# Patient Record
Sex: Male | Born: 1965 | Race: White | Hispanic: No | Smoking: Current some day smoker
Health system: Southern US, Community
[De-identification: ages and names within clinical notes are randomized; demographics above are authoritative.]

## PROBLEM LIST (undated history)

## (undated) DIAGNOSIS — I509 Heart failure, unspecified: Secondary | ICD-10-CM

## (undated) DIAGNOSIS — I10 Essential (primary) hypertension: Secondary | ICD-10-CM

## (undated) DIAGNOSIS — G4733 Obstructive sleep apnea (adult) (pediatric): Secondary | ICD-10-CM

## (undated) DIAGNOSIS — J398 Other specified diseases of upper respiratory tract: Secondary | ICD-10-CM

## (undated) DIAGNOSIS — M5030 Other cervical disc degeneration, unspecified cervical region: Secondary | ICD-10-CM

## (undated) DIAGNOSIS — I5032 Chronic diastolic (congestive) heart failure: Secondary | ICD-10-CM

## (undated) DIAGNOSIS — I2510 Atherosclerotic heart disease of native coronary artery without angina pectoris: Secondary | ICD-10-CM

## (undated) DIAGNOSIS — G959 Disease of spinal cord, unspecified: Secondary | ICD-10-CM

## (undated) DIAGNOSIS — E785 Hyperlipidemia, unspecified: Secondary | ICD-10-CM

## (undated) DIAGNOSIS — J449 Chronic obstructive pulmonary disease, unspecified: Secondary | ICD-10-CM

## (undated) DIAGNOSIS — E119 Type 2 diabetes mellitus without complications: Secondary | ICD-10-CM

## (undated) DIAGNOSIS — M5020 Other cervical disc displacement, unspecified cervical region: Secondary | ICD-10-CM

## (undated) DIAGNOSIS — M109 Gout, unspecified: Secondary | ICD-10-CM

## (undated) DIAGNOSIS — N183 Chronic kidney disease, stage 3 unspecified: Secondary | ICD-10-CM

## (undated) DIAGNOSIS — E7800 Pure hypercholesterolemia, unspecified: Secondary | ICD-10-CM

## (undated) HISTORY — PX: CHOLECYSTECTOMY: SHX55

## (undated) HISTORY — PX: BACK SURGERY: SHX140

## (undated) HISTORY — DX: Essential (primary) hypertension: I10

## (undated) HISTORY — DX: Gout, unspecified: M10.9

## (undated) HISTORY — DX: Other cervical disc displacement, unspecified cervical region: M50.20

## (undated) HISTORY — DX: Other cervical disc degeneration, unspecified cervical region: M50.30

## (undated) HISTORY — PX: CARDIAC CATHETERIZATION: SHX172

---

## 2006-01-13 ENCOUNTER — Emergency Department: Payer: Self-pay | Admitting: Emergency Medicine

## 2006-01-14 ENCOUNTER — Ambulatory Visit: Payer: Self-pay | Admitting: Emergency Medicine

## 2006-01-16 ENCOUNTER — Ambulatory Visit: Payer: Self-pay | Admitting: General Surgery

## 2006-10-27 ENCOUNTER — Ambulatory Visit: Payer: Self-pay | Admitting: Family Medicine

## 2007-09-17 ENCOUNTER — Emergency Department: Payer: Self-pay | Admitting: Emergency Medicine

## 2011-08-04 ENCOUNTER — Emergency Department: Payer: Self-pay | Admitting: Emergency Medicine

## 2014-04-28 ENCOUNTER — Emergency Department: Payer: Self-pay | Admitting: Internal Medicine

## 2014-04-28 LAB — URINALYSIS, COMPLETE
BACTERIA: NONE SEEN
BILIRUBIN, UR: NEGATIVE
GLUCOSE, UR: NEGATIVE mg/dL (ref 0–75)
Ketone: NEGATIVE
LEUKOCYTE ESTERASE: NEGATIVE
NITRITE: NEGATIVE
Ph: 5 (ref 4.5–8.0)
Protein: 100
SPECIFIC GRAVITY: 1.03 (ref 1.003–1.030)

## 2014-05-19 ENCOUNTER — Emergency Department: Payer: Self-pay | Admitting: Emergency Medicine

## 2014-05-19 LAB — URIC ACID: Uric Acid: 8.2 mg/dL — ABNORMAL HIGH (ref 3.5–7.2)

## 2015-03-17 ENCOUNTER — Emergency Department
Admission: EM | Admit: 2015-03-17 | Discharge: 2015-03-17 | Disposition: A | Discharge status: Home / Self Care | Payer: Self-pay | Attending: Emergency Medicine | Admitting: Emergency Medicine

## 2015-03-17 ENCOUNTER — Encounter: Payer: Self-pay | Admitting: *Deleted

## 2015-03-17 DIAGNOSIS — L259 Unspecified contact dermatitis, unspecified cause: Secondary | ICD-10-CM | POA: Insufficient documentation

## 2015-03-17 DIAGNOSIS — Z720 Tobacco use: Secondary | ICD-10-CM | POA: Insufficient documentation

## 2015-03-17 MED ORDER — HYDROCORTISONE 1 % EX CREA: TOPICAL_CREAM | Freq: Two times a day (BID) | CUTANEOUS | Status: DC

## 2015-03-17 MED ORDER — HYDROCORTISONE 1 % EX CREA
TOPICAL_CREAM | Freq: Two times a day (BID) | CUTANEOUS | Status: DC
  Administered 2015-03-17: 05:00:00 via TOPICAL
  Filled 2015-03-17: qty 28

## 2015-03-17 MED ORDER — DIPHENHYDRAMINE HCL 50 MG PO CAPS
50.0000 mg | ORAL_CAPSULE | Freq: Once | ORAL | Status: AC
  Administered 2015-03-17: 50 mg via ORAL

## 2015-03-17 MED ORDER — DIPHENHYDRAMINE HCL 50 MG PO CAPS
ORAL_CAPSULE | ORAL | Status: AC
  Administered 2015-03-17: 50 mg via ORAL
  Filled 2015-03-17: qty 1

## 2015-03-17 NOTE — ED Notes (Signed)
Pt presents with c/o rash on left arm since Monday night. Pt reports itching and "pins and needles shooting down my arm". Pt denies use of any OTC medications PTA. Pt denies any recent changes in laundry detergents/environmental conditions that may contribute to rash.

## 2015-03-17 NOTE — ED Notes (Signed)
Brown, MD at bedside. 

## 2015-03-17 NOTE — ED Provider Notes (Signed)
[facility] Emergency Department Provider Note  ____________________________________________  Time seen: 4:20 AM  I have reviewed the triage vital signs and the nursing notes.   HISTORY  Chief Complaint Rash      HPI Cory Rivas is a 49 y.o. male resents with rash to the left forearm that is pruritic 4 days. Patient denies any fever no nausea no vomiting     History reviewed. No pertinent past medical history.  There are no active problems to display for this patient.   Past Surgical History  Procedure Laterality Date  . Cholecystectomy      No current outpatient prescriptions on file.  Allergies Review of patient's allergies indicates no known allergies.  No family history on file.  Social History History  Substance Use Topics  . Smoking status: Current Every Day Smoker  . Smokeless tobacco: Not on file  . Alcohol Use: No    Review of Systems  Constitutional: Negative for fever. Eyes: Negative for visual changes. ENT: Negative for sore throat. Cardiovascular: Negative for chest pain. Respiratory: Negative for shortness of breath. Gastrointestinal: Negative for abdominal pain, vomiting and diarrhea. Genitourinary: Negative for dysuria. Musculoskeletal: Negative for back pain. Skin: Positive for rash. Neurological: Negative for headaches, focal weakness or numbness.   10-point ROS otherwise negative.  ____________________________________________   PHYSICAL EXAM:  VITAL SIGNS: ED Triage Vitals  Enc Vitals Group     BP 03/17/15 0158 159/97 mmHg     Pulse Rate 03/17/15 0158 76     Resp 03/17/15 0158 22     Temp 03/17/15 0158 97.3 F (36.3 C)     Temp Source 03/17/15 0158 Oral     SpO2 03/17/15 0158 98 %     Weight 03/17/15 0158 245 lb (111.131 kg)     Height 03/17/15 0158 5\' 6"  (1.676 m)     Head Cir --      Peak Flow --      Pain Score 03/17/15 0417 4     Pain Loc --      Pain Edu? --      Excl. in [place]? --       Constitutional: Alert and oriented. Well appearing and in no distress. Genitourinary: deferred Musculoskeletal: Nontender with normal range of motion in all extremities. No joint effusions.  No lower extremity tenderness nor edema. Neurologic:  Normal speech and language. No gross focal neurologic deficits are appreciated. Speech is normal.  Skin:  Very scant area of maculopapular rash noted to the left forearm and no distinct pattern with evidence of excoriation noted. Psychiatric: Mood and affect are normal. Speech and behavior are normal. Patient exhibits appropriate insight and judgment.  ____________________________________________       INITIAL IMPRESSION / ASSESSMENT AND PLAN / ED COURSE  Pertinent labs & imaging results that were available during my care of the patient were reviewed by me and considered in my medical decision making (see chart for details).  History of physical concerning for possible contact dermatitis as such patient received Benadryl for pruritus. In addition patient received hydrocortisone topical cream.  ____________________________________________   FINAL CLINICAL IMPRESSION(S) / ED DIAGNOSES  Final diagnoses:  Contact dermatitis      Gregor Hams, MD 03/17/15 [phone]

## 2015-03-17 NOTE — Discharge Instructions (Signed)

## 2015-03-17 NOTE — ED Notes (Signed)
Pt reports a rash that is going up his right forearm, "feels like sharp pins" and itching

## 2015-11-22 DIAGNOSIS — G959 Disease of spinal cord, unspecified: Secondary | ICD-10-CM | POA: Insufficient documentation

## 2015-11-22 DIAGNOSIS — M5136 Other intervertebral disc degeneration, lumbar region: Secondary | ICD-10-CM | POA: Insufficient documentation

## 2015-11-22 DIAGNOSIS — M5030 Other cervical disc degeneration, unspecified cervical region: Secondary | ICD-10-CM | POA: Insufficient documentation

## 2015-11-22 DIAGNOSIS — M4722 Other spondylosis with radiculopathy, cervical region: Secondary | ICD-10-CM | POA: Insufficient documentation

## 2015-11-22 DIAGNOSIS — M4726 Other spondylosis with radiculopathy, lumbar region: Secondary | ICD-10-CM | POA: Insufficient documentation

## 2016-07-04 ENCOUNTER — Encounter: Payer: Self-pay | Admitting: Intensive Care

## 2016-07-04 ENCOUNTER — Emergency Department
Admission: EM | Admit: 2016-07-04 | Discharge: 2016-07-04 | Disposition: A | Discharge status: Home / Self Care | Payer: Self-pay | Attending: Emergency Medicine | Admitting: Emergency Medicine

## 2016-07-04 DIAGNOSIS — E119 Type 2 diabetes mellitus without complications: Secondary | ICD-10-CM | POA: Insufficient documentation

## 2016-07-04 DIAGNOSIS — H9192 Unspecified hearing loss, left ear: Secondary | ICD-10-CM | POA: Insufficient documentation

## 2016-07-04 DIAGNOSIS — F17210 Nicotine dependence, cigarettes, uncomplicated: Secondary | ICD-10-CM | POA: Insufficient documentation

## 2016-07-04 DIAGNOSIS — H7292 Unspecified perforation of tympanic membrane, left ear: Secondary | ICD-10-CM | POA: Insufficient documentation

## 2016-07-04 HISTORY — DX: Type 2 diabetes mellitus without complications: E11.9

## 2016-07-04 HISTORY — DX: Pure hypercholesterolemia, unspecified: E78.00

## 2016-07-04 MED ORDER — CIPROFLOXACIN HCL 0.3 % OP SOLN: 2.0000 [drp] | OPHTHALMIC | 0 refills | Status: DC

## 2016-07-04 MED ORDER — NAPROXEN 500 MG PO TABS: 500.0000 mg | ORAL_TABLET | Freq: Two times a day (BID) | ORAL | 0 refills | Status: DC

## 2016-07-04 MED ORDER — AMOXICILLIN 500 MG PO TABS: 500.0000 mg | ORAL_TABLET | Freq: Three times a day (TID) | ORAL | 0 refills | Status: DC

## 2016-07-04 MED ORDER — TRAMADOL HCL 50 MG PO TABS: 50.0000 mg | ORAL_TABLET | Freq: Four times a day (QID) | ORAL | 0 refills | Status: DC | PRN

## 2016-07-04 NOTE — ED Notes (Signed)
Pt informed to return if any life threatening symptoms occur.  

## 2016-07-04 NOTE — ED Triage Notes (Addendum)
Pt presents to ER with L ear pain since last Friday. Patient states "my ear has dried blood in it and has been bleeding on and off" Pt states "my hearing in the L ear has gradually gotten worse since Friday and now I cannot hear out of my L ear". No drainage noted at this time. Pt tried to see PCP and they had no openings and told pt to come to ER

## 2016-07-04 NOTE — ED Provider Notes (Signed)
[facility] Emergency Department Provider Note ____________________________________________  Time seen: Approximately 10:45 AM  I have reviewed the triage vital signs and the nursing notes.   HISTORY  Chief Complaint Otalgia    HPI Cory Rivas is a 50 y.o. male presents to the emergency department with left ear pain since last Friday. He states that he has noticed some dried blood in the ear canal on and off and he has decreased hearing in the left ear as well. Pain is gone worse since Friday. No drainage present at this time.No relief with warm compresses.  Past Medical History:  Diagnosis Date  . Diabetes mellitus without complication ([place])   . High cholesterol     There are no active problems to display for this patient.   Past Surgical History:  Procedure Laterality Date  . CHOLECYSTECTOMY      Prior to Admission medications   Medication Sig Start Date End Date Taking? Authorizing Provider  amoxicillin (AMOXIL) 500 MG tablet Take 1 tablet (500 mg total) by mouth 3 (three) times daily. 07/04/16   Victorino Dike, FNP  ciprofloxacin (CILOXAN) 0.3 % ophthalmic solution Place 2 drops into the right ear every 4 (four) hours while awake. 07/04/16   Victorino Dike, FNP  hydrocortisone cream 1 % Apply topically 2 (two) times daily. 03/17/15   Gregor Hams, MD  naproxen (NAPROSYN) 500 MG tablet Take 1 tablet (500 mg total) by mouth 2 (two) times daily with a meal. 07/04/16   Lamia Mariner B [place] Festa, FNP  traMADol (ULTRAM) 50 MG tablet Take 1 tablet (50 mg total) by mouth every 6 (six) hours as needed. 07/04/16   Victorino Dike, FNP    Allergies Review of patient's allergies indicates no known allergies.  History reviewed. No pertinent family history.  Social History Social History  Substance Use Topics  . Smoking status: Current Every Day Smoker    Types: Cigarettes  . Smokeless tobacco: Never Used  . Alcohol use No    Review of  Systems Constitutional: Negative for fever/chills Eyes: No visual changes. ENT: Positive for left earache. Positive for hearing loss to the left ear Gastrointestinal: No nausea, no vomiting. Musculoskeletal: Negative for pain. Skin: Negative for rash. Neurological: Negative for headaches, focal weakness or numbness. ____________________________________________   PHYSICAL EXAM:  VITAL SIGNS: ED Triage Vitals  Enc Vitals Group     BP 07/04/16 0926 135/82     Pulse Rate 07/04/16 0926 69     Resp 07/04/16 0926 18     Temp 07/04/16 0926 98.1 F (36.7 C)     Temp Source 07/04/16 0926 Oral     SpO2 07/04/16 0926 95 %     Weight 07/04/16 0927 250 lb (113.4 kg)     Height 07/04/16 0927 5\' 7"  (1.702 m)     Head Circumference --      Peak Flow --      Pain Score 07/04/16 0928 8     Pain Loc --      Pain Edu? --      Excl. in [place]? --     Constitutional: Alert and oriented. Well appearing and in no acute distress. Eyes: Conjunctivae are normal. PERRL. EOMI. Ears: Positive for pain with movement of left auricle:; External canal patent;  Right TM erythematous and retracted; Left TM perforation present, no drainage noted   Head: Atraumatic. Nose: No congestion/rhinnorhea. Mouth/Throat: Mucous membranes are moist.  Oropharynx non-erythematous. Hematological/Lymphatic/Immunilogical: No cervical lymphadenopathy. Cardiovascular: Normal capillary  refill. Respiratory: Normal respiratory effort.  No retractions.  Neurologic:  Normal speech and language. No gross focal neurologic deficits are appreciated. Speech is normal. No gait instability. Skin:  Skin is warm, dry and intact. No rash noted. ____________________________________________   LABS (all labs ordered are listed, but only abnormal results are displayed)  Labs Reviewed - No data to display ____________________________________________   RADIOLOGY  Not  indicated ____________________________________________   PROCEDURES  Procedure(s) performed: None  ____________________________________________   INITIAL IMPRESSION / ASSESSMENT AND PLAN / ED COURSE  Pertinent labs & imaging results that were available during my care of the patient were reviewed by me and considered in my medical decision making (see chart for details).  Clinical Course    Prescriptions for Ciprofloxacin drops and amoxicillin will be given today as well as tramadol and Naprosyn for pain.  The patient was advised to follow up with ENT. He was advised to return to the emergency department for symptoms that change or worsen if unable to schedule an appointment.  ____________________________________________   FINAL CLINICAL IMPRESSION(S) / ED DIAGNOSES  Final diagnoses:  Tympanic membrane rupture, left  Acute hearing loss, left    Note:  This document was prepared using Dragon voice recognition software and may include unintentional dictation errors.     Victorino Dike, FNP 07/04/16 [place], MD 07/21/16 [phone]

## 2016-07-04 NOTE — ED Notes (Signed)
States left ear pain sine last Friday, states he noticed dried blood in his ear, pt awake and alert in no distress

## 2016-09-20 ENCOUNTER — Encounter: Payer: Self-pay | Admitting: *Deleted

## 2016-09-20 ENCOUNTER — Emergency Department
Admission: EM | Admit: 2016-09-20 | Discharge: 2016-09-20 | Disposition: A | Discharge status: Home / Self Care | Payer: Self-pay | Attending: Emergency Medicine | Admitting: Emergency Medicine

## 2016-09-20 DIAGNOSIS — Z79899 Other long term (current) drug therapy: Secondary | ICD-10-CM | POA: Insufficient documentation

## 2016-09-20 DIAGNOSIS — M109 Gout, unspecified: Secondary | ICD-10-CM

## 2016-09-20 DIAGNOSIS — F17210 Nicotine dependence, cigarettes, uncomplicated: Secondary | ICD-10-CM | POA: Insufficient documentation

## 2016-09-20 DIAGNOSIS — Z791 Long term (current) use of non-steroidal anti-inflammatories (NSAID): Secondary | ICD-10-CM | POA: Insufficient documentation

## 2016-09-20 DIAGNOSIS — M10071 Idiopathic gout, right ankle and foot: Secondary | ICD-10-CM | POA: Insufficient documentation

## 2016-09-20 DIAGNOSIS — E119 Type 2 diabetes mellitus without complications: Secondary | ICD-10-CM | POA: Insufficient documentation

## 2016-09-20 MED ORDER — INDOMETHACIN 50 MG PO CAPS: 50.0000 mg | ORAL_CAPSULE | Freq: Three times a day (TID) | ORAL | 0 refills | Status: DC | PRN

## 2016-09-20 MED ORDER — INDOMETHACIN 50 MG PO CAPS
50.0000 mg | ORAL_CAPSULE | Freq: Once | ORAL | Status: AC
  Administered 2016-09-20: 50 mg via ORAL
  Filled 2016-09-20: qty 1

## 2016-09-20 MED ORDER — COLCHICINE 0.6 MG PO TABS: 0.6000 mg | ORAL_TABLET | Freq: Every day | ORAL | 0 refills | Status: DC

## 2016-09-20 MED ORDER — COLCHICINE 0.6 MG PO TABS
1.2000 mg | ORAL_TABLET | Freq: Once | ORAL | Status: AC
  Administered 2016-09-20: 1.2 mg via ORAL
  Filled 2016-09-20: qty 2

## 2016-09-20 NOTE — ED Notes (Signed)
Pt has right foot pain.  Pt states gout meds are not working and pt has increased pain in right foot.  No known injury.  Pt alert

## 2016-09-20 NOTE — ED Triage Notes (Signed)
Pt has pain in right foot.  Pt states gout meds not working.  Pt has increased pain.  No known injury

## 2016-09-20 NOTE — ED Provider Notes (Signed)
[facility] Emergency Department Provider Note  ____________________________________________  Time seen: Approximately 9:06 PM  I have reviewed the triage vital signs and the nursing notes.   HISTORY  Chief Complaint Foot Pain    HPI Cory Rivas is a 50 y.o. male , NAD, presents to emergency with approximately 1 week history of right foot pain and swelling. Patient states he has a history of gout in which she was placed on allopurinol approximately one year ago. Seasonal flares of gout in his feet and ankles which typically occurs in the winter time. Has had redness, warmth, swelling and pain about the dorsal portion of the right foot over the last week. States he has a family member in the hospital as been walking more than usual. Also notes he was newly diagnosed with diabetes over the last year and takes medication on regular basis. States blood sugar was less than 150 when he checked this morning. Denies any open wounds, ulcers or lesions about the foot. Denies any injuries, traumas or falls. Has no numbness, weakness, tingling. Has had no fevers, chills or body aches. Denies any chest pain or shortness of breath. States this episode is similar to pleurisy sat in the past but he is out of his colchicine.   Past Medical History:  Diagnosis Date  . Diabetes mellitus without complication ([place])   . High cholesterol     There are no active problems to display for this patient.   Past Surgical History:  Procedure Laterality Date  . CHOLECYSTECTOMY      Prior to Admission medications   Medication Sig Start Date End Date Taking? Authorizing Provider  amoxicillin (AMOXIL) 500 MG tablet Take 1 tablet (500 mg total) by mouth 3 (three) times daily. 07/04/16   Victorino Dike, FNP  ciprofloxacin (CILOXAN) 0.3 % ophthalmic solution Place 2 drops into the right ear every 4 (four) hours while awake. 07/04/16   Victorino Dike, FNP  colchicine 0.6 MG tablet Take 1  tablet (0.6 mg total) by mouth daily. Take 2 tablets at onset of pain, then may take 1 more 1 hour later if pain continues. 09/20/16   Jami L Hagler, PA-C  hydrocortisone cream 1 % Apply topically 2 (two) times daily. 03/17/15   Gregor Hams, MD  indomethacin (INDOCIN) 50 MG capsule Take 1 capsule (50 mg total) by mouth 3 (three) times daily with meals as needed. 09/20/16   Jami L Hagler, PA-C  naproxen (NAPROSYN) 500 MG tablet Take 1 tablet (500 mg total) by mouth 2 (two) times daily with a meal. 07/04/16   Cari B Triplett, FNP  traMADol (ULTRAM) 50 MG tablet Take 1 tablet (50 mg total) by mouth every 6 (six) hours as needed. 07/04/16   Victorino Dike, FNP    Allergies Patient has no known allergies.  No family history on file.  Social History Social History  Substance Use Topics  . Smoking status: Current Every Day Smoker    Types: Cigarettes  . Smokeless tobacco: Never Used  . Alcohol use No     Review of Systems  Constitutional: No fever/chills Cardiovascular: No chest pain. Respiratory: No shortness of breath.  Gastrointestinal: No abdominal pain.  No nausea, vomiting. Musculoskeletal: Positive right foot pain.  Skin: Positive redness, swelling about the right foot. Negative for rash, open wounds, ulcers. Neurological: Negative for numbness, weakness, tingling. 10-point ROS otherwise negative.  ____________________________________________   PHYSICAL EXAM:  VITAL SIGNS: ED Triage Vitals  Enc Vitals  Group     BP 09/20/16 2056 (!) 164/88     Pulse Rate 09/20/16 2056 80     Resp 09/20/16 2056 18     Temp 09/20/16 2056 98.1 F (36.7 C)     Temp Source 09/20/16 2056 Oral     SpO2 09/20/16 2056 96 %     Weight 09/20/16 2057 250 lb (113.4 kg)     Height 09/20/16 2057 5\' 6"  (1.676 m)     Head Circumference --      Peak Flow --      Pain Score --      Pain Loc --      Pain Edu? --      Excl. in [place]? --      Constitutional: Alert and oriented. Well appearing and in  no acute distress. Eyes: Conjunctivae are normal.  Head: Atraumatic. Cardiovascular: Good peripheral circulation With 2+ pulses noted in the right lower extremity. Capillary refill is brisk in all digits of the right foot Respiratory: Normal respiratory effort without tachypnea or retractions. Musculoskeletal: Tenderness to palpation diffusely about the dorsal right foot without bony abnormality or crepitus.  No lower extremity tenderness nor edema.  No joint effusions. Neurologic:  Normal speech and language. No gross focal neurologic deficits are appreciated. Sensation to light touch grossly intact about the right lower extremity. Skin: Mild warmth is noted to the skin about the dorsal right foot without evidence of cellulitis or abscess. No open wounds, ulcers or other lesions. No active oozing or weeping. Skin is warm, dry and intact. No rash, bruising noted. Psychiatric: Mood and affect are normal. Speech and behavior are normal. Patient exhibits appropriate insight and judgement.   ____________________________________________   LABS  None ____________________________________________  EKG  None ____________________________________________  RADIOLOGY  None ____________________________________________    PROCEDURES  Procedure(s) performed: None   Procedures   Medications  colchicine tablet 1.2 mg (1.2 mg Oral Given 09/20/16 2153)  indomethacin (INDOCIN) capsule 50 mg (50 mg Oral Given 09/20/16 2153)     ____________________________________________   INITIAL IMPRESSION / ASSESSMENT AND PLAN / ED COURSE  Pertinent labs & imaging results that were available during my care of the patient were reviewed by me and considered in my medical decision making (see chart for details).  Clinical Course     Patient's diagnosis is consistent with Acute gout of the right foot. Patient was given colchicine and indomethacin while in the emergency department and tolerated well  without side effects. Patient will be discharged home with prescriptions for colchicine and indomethacin to take as directed. Patient is to follow up with Dr. Elvina Mattes and podiatry for diabetic foot care. Patient is also to follow up with his primary care provider if gout symptoms persist past this treatment course. Patient is given ED precautions to return to the ED for any worsening or new symptoms.    ____________________________________________  FINAL CLINICAL IMPRESSION(S) / ED DIAGNOSES  Final diagnoses:  Acute gout of right foot, unspecified cause      NEW MEDICATIONS STARTED DURING THIS VISIT:  Discharge Medication List as of 09/20/2016  9:23 PM    START taking these medications   Details  colchicine 0.6 MG tablet Take 1 tablet (0.6 mg total) by mouth daily. Take 2 tablets at onset of pain, then may take 1 more 1 hour later if pain continues., Starting Thu 09/20/2016, Print    indomethacin (INDOCIN) 50 MG capsule Take 1 capsule (50 mg total) by mouth 3 (three) times  daily with meals as needed., Starting Thu 09/20/2016, [place], PA-C 09/20/16 2212    Rudene Re, MD 09/23/16 [phone]

## 2017-12-18 LAB — TSH: TSH: 1.86 (ref 0.41–5.90)

## 2017-12-18 LAB — BASIC METABOLIC PANEL
BUN: 13 (ref 4–21)
Creatinine: 1 (ref 0.6–1.3)
Glucose: 89
Potassium: 4.5 (ref 3.4–5.3)
Sodium: 142 (ref 137–147)

## 2017-12-18 LAB — HEMOGLOBIN A1C: Hemoglobin A1C: 5.8

## 2018-01-22 DIAGNOSIS — G8929 Other chronic pain: Secondary | ICD-10-CM | POA: Diagnosis not present

## 2018-01-22 DIAGNOSIS — M5442 Lumbago with sciatica, left side: Secondary | ICD-10-CM | POA: Diagnosis not present

## 2018-01-22 DIAGNOSIS — M542 Cervicalgia: Secondary | ICD-10-CM | POA: Diagnosis not present

## 2018-01-22 DIAGNOSIS — M5412 Radiculopathy, cervical region: Secondary | ICD-10-CM | POA: Diagnosis not present

## 2018-06-02 DIAGNOSIS — G959 Disease of spinal cord, unspecified: Secondary | ICD-10-CM | POA: Diagnosis not present

## 2018-08-03 ENCOUNTER — Emergency Department
Admission: EM | Admit: 2018-08-03 | Discharge: 2018-08-03 | Disposition: A | Discharge status: Home / Self Care | Payer: Medicaid Other | Attending: Emergency Medicine | Admitting: Emergency Medicine

## 2018-08-03 ENCOUNTER — Other Ambulatory Visit: Payer: Self-pay

## 2018-08-03 DIAGNOSIS — Z79899 Other long term (current) drug therapy: Secondary | ICD-10-CM | POA: Diagnosis not present

## 2018-08-03 DIAGNOSIS — R2242 Localized swelling, mass and lump, left lower limb: Secondary | ICD-10-CM | POA: Diagnosis present

## 2018-08-03 DIAGNOSIS — F17210 Nicotine dependence, cigarettes, uncomplicated: Secondary | ICD-10-CM | POA: Diagnosis not present

## 2018-08-03 DIAGNOSIS — M109 Gout, unspecified: Secondary | ICD-10-CM | POA: Insufficient documentation

## 2018-08-03 DIAGNOSIS — E119 Type 2 diabetes mellitus without complications: Secondary | ICD-10-CM | POA: Diagnosis not present

## 2018-08-03 DIAGNOSIS — R21 Rash and other nonspecific skin eruption: Secondary | ICD-10-CM | POA: Insufficient documentation

## 2018-08-03 LAB — GLUCOSE, CAPILLARY: Glucose-Capillary: 112 mg/dL — ABNORMAL HIGH (ref 70–99)

## 2018-08-03 MED ORDER — PREDNISONE 10 MG PO TABS: ORAL_TABLET | ORAL | 0 refills | Status: DC

## 2018-08-03 MED ORDER — KETOROLAC TROMETHAMINE 30 MG/ML IJ SOLN
30.0000 mg | Freq: Once | INTRAMUSCULAR | Status: AC
  Administered 2018-08-03: 30 mg via INTRAMUSCULAR
  Filled 2018-08-03: qty 1

## 2018-08-03 MED ORDER — METHYLPREDNISOLONE SODIUM SUCC 125 MG IJ SOLR
125.0000 mg | Freq: Once | INTRAMUSCULAR | Status: AC
  Administered 2018-08-03: 125 mg via INTRAMUSCULAR
  Filled 2018-08-03: qty 2

## 2018-08-03 MED ORDER — INDOMETHACIN 50 MG PO CAPS: 50.0000 mg | ORAL_CAPSULE | Freq: Three times a day (TID) | ORAL | 0 refills | Status: DC

## 2018-08-03 NOTE — ED Notes (Signed)
Pain and  Swelling l  Foot  Especially  Big toe    Pain began 3 days ago    History  Of gout   Denies any injury

## 2018-08-03 NOTE — ED Provider Notes (Signed)
[facility] Emergency Department Provider Note  ____________________________________________  Time seen: Approximately 8:58 PM  I have reviewed the triage vital signs and the nursing notes.   HISTORY  Chief Complaint Gout    HPI Cory Rivas is a 52 y.o. male to emergency department for evaluation of left ankle swelling and pain for 3 days.  Patient has a history of gout and states that this feels the exact same.  He states that gout started in his second toe and is now in his ankle.  He gets gout every fall at this same time of year.  No injury.  Patient also states that he noticed a rash 3 days ago to the center of his chest.  He recently used a new laundry detergent and a "cheap soap".  Rash is mildly itchy.  No new medications.  Primary care was not able to see him until next week. No wounds. No trauma. No IV drug use.   Past Medical History:  Diagnosis Date  . Diabetes mellitus without complication ([place])   . High cholesterol     There are no active problems to display for this patient.   Past Surgical History:  Procedure Laterality Date  . CHOLECYSTECTOMY      Prior to Admission medications   Medication Sig Start Date End Date Taking? Authorizing Provider  amoxicillin (AMOXIL) 500 MG tablet Take 1 tablet (500 mg total) by mouth 3 (three) times daily. 07/04/16   Triplett, Cari B, FNP  ciprofloxacin (CILOXAN) 0.3 % ophthalmic solution Place 2 drops into the right ear every 4 (four) hours while awake. 07/04/16   Triplett, Johnette Abraham B, FNP  colchicine 0.6 MG tablet Take 1 tablet (0.6 mg total) by mouth daily. Take 2 tablets at onset of pain, then may take 1 more 1 hour later if pain continues. 09/20/16   Hagler, Jami L, PA-C  hydrocortisone cream 1 % Apply topically 2 (two) times daily. 03/17/15   Gregor Hams, MD  indomethacin (INDOCIN) 50 MG capsule Take 1 capsule (50 mg total) by mouth 3 (three) times daily. 08/03/18   Laban Emperor, PA-C  naproxen  (NAPROSYN) 500 MG tablet Take 1 tablet (500 mg total) by mouth 2 (two) times daily with a meal. 07/04/16   Triplett, Cari B, FNP  predniSONE (DELTASONE) 10 MG tablet Take 6 tablets on day 1, take 5 tablets on day 2, take 4 tablets on day 3, take 3 tablets on day 4, take 2 tablets on day 5, take 1 tablet on day 6 08/03/18   Laban Emperor, PA-C  traMADol (ULTRAM) 50 MG tablet Take 1 tablet (50 mg total) by mouth every 6 (six) hours as needed. 07/04/16   Victorino Dike, FNP    Allergies Patient has no known allergies.  No family history on file.  Social History Social History   Tobacco Use  . Smoking status: Current Every Day Smoker    Types: Cigarettes  . Smokeless tobacco: Never Used  Substance Use Topics  . Alcohol use: No  . Drug use: No     Review of Systems  Cardiovascular: No chest pain. Respiratory: No SOB. Gastrointestinal: No abdominal pain.  No nausea, no vomiting.  Musculoskeletal: Positive for ankle pain.  Skin: Negative for abrasions, lacerations, ecchymosis. Positive for rash.  Neurological: Negative for headaches, numbness or tingling   ____________________________________________   PHYSICAL EXAM:  VITAL SIGNS: ED Triage Vitals  Enc Vitals Group     BP 08/03/18 1952 (!) 160/74  Pulse Rate 08/03/18 1952 88     Resp 08/03/18 1952 19     Temp 08/03/18 1952 99.5 F (37.5 C)     Temp Source 08/03/18 1952 Oral     SpO2 08/03/18 1952 97 %     Weight 08/03/18 1953 250 lb (113.4 kg)     Height 08/03/18 1953 5\' 7"  (1.702 m)     Head Circumference --      Peak Flow --      Pain Score 08/03/18 1953 10     Pain Loc --      Pain Edu? --      Excl. in [place]? --      Constitutional: Alert and oriented. Well appearing and in no acute distress. Eyes: Conjunctivae are normal. PERRL. EOMI. Head: Atraumatic. ENT:      Ears:      Nose: No congestion/rhinnorhea.      Mouth/Throat: Mucous membranes are moist.  Neck: No stridor.  Cardiovascular: Normal rate,  regular rhythm.  Good peripheral circulation. Respiratory: Normal respiratory effort without tachypnea or retractions. Lungs CTAB. Good air entry to the bases with no decreased or absent breath sounds. Gastrointestinal: Bowel sounds 4 quadrants. Soft and nontender to palpation. No guarding or rigidity. No palpable masses. No distention.  Musculoskeletal: Full range of motion to all extremities. No gross deformities appreciated.  Swelling and erythema to lateral ankle.  Full range of motion of ankle but with pain. Neurologic:  Normal speech and language. No gross focal neurologic deficits are appreciated.  Skin:  Skin is warm, dry and intact.  Few pink 1 cm plaques to center chest. Psychiatric: Mood and affect are normal. Speech and behavior are normal. Patient exhibits appropriate insight and judgement.   ____________________________________________   LABS (all labs ordered are listed, but only abnormal results are displayed)  Labs Reviewed  GLUCOSE, CAPILLARY - Abnormal; Notable for the following components:      Result Value   Glucose-Capillary 112 (*)    All other components within normal limits  CBG MONITORING, ED   ____________________________________________  EKG   ____________________________________________  RADIOLOGY   No results found.  ____________________________________________    PROCEDURES  Procedure(s) performed:    Procedures    Medications  methylPREDNISolone sodium succinate (SOLU-MEDROL) 125 mg/2 mL injection 125 mg (125 mg Intramuscular Given 08/03/18 2149)  ketorolac (TORADOL) 30 MG/ML injection 30 mg (30 mg Intramuscular Given 08/03/18 2152)     ____________________________________________   INITIAL IMPRESSION / ASSESSMENT AND PLAN / ED COURSE  Pertinent labs & imaging results that were available during my care of the patient were reviewed by me and considered in my medical decision making (see chart for details).  Review of the [place]  CSRS was performed in accordance of the [place] prior to dispensing any controlled drugs.     Patient's diagnosis is consistent with gout.  Vital signs and exam are reassuring.  IM Solu-Medrol and Toradol were given.  We discussed small likelihood of cellulitis or infection and patient declines xray, antibiotics or further work-up.  Rash appears inflammatory.  No indication of infection.  Patient will be discharged home with prescriptions for indomethacin and prednisone. Patient is to follow up with primary care as directed.  Patient is agreeable with plan.  Patient is given ED precautions to return to the ED for any worsening or new symptoms.     ____________________________________________  FINAL CLINICAL IMPRESSION(S) / ED DIAGNOSES  Final diagnoses:  Acute gout of left ankle, unspecified cause  Rash      NEW MEDICATIONS STARTED DURING THIS VISIT:  ED Discharge Orders         Ordered    indomethacin (INDOCIN) 50 MG capsule  3 times daily     08/03/18 2145    predniSONE (DELTASONE) 10 MG tablet     08/03/18 2145              This chart was dictated using voice recognition software/Dragon. Despite best efforts to proofread, errors can occur which can change the meaning. Any change was purely unintentional.    Laban Emperor, PA-C 08/03/18 2247    Earleen Newport, MD 08/08/18 1356

## 2018-08-03 NOTE — ED Triage Notes (Addendum)
Pt comes via POV from home with c/o possible gout. Pt states this started last Thursday. Pt states redness, swelling and pain to left ankle. Pt states it started in his toes and has worked its way up his foot to his ankle.  Pt also states a rash that he noticed Friday night on his chest. Pt states pain and itching. Pt denies any new foods or detergents.

## 2018-09-30 DIAGNOSIS — M488X2 Other specified spondylopathies, cervical region: Secondary | ICD-10-CM | POA: Diagnosis not present

## 2018-09-30 DIAGNOSIS — M4802 Spinal stenosis, cervical region: Secondary | ICD-10-CM | POA: Diagnosis not present

## 2019-03-18 ENCOUNTER — Ambulatory Visit: Payer: Medicaid Other | Admitting: Family Medicine

## 2019-03-18 ENCOUNTER — Telehealth: Payer: Self-pay | Admitting: Family Medicine

## 2019-03-18 ENCOUNTER — Encounter: Payer: Self-pay | Admitting: Family Medicine

## 2019-03-18 ENCOUNTER — Other Ambulatory Visit: Payer: Self-pay

## 2019-03-18 VITALS — BP 150/92 | HR 96 | Temp 98.1°F | Resp 14 | Ht 66.0 in | Wt 254.7 lb

## 2019-03-18 DIAGNOSIS — I10 Essential (primary) hypertension: Secondary | ICD-10-CM | POA: Diagnosis not present

## 2019-03-18 DIAGNOSIS — Z1211 Encounter for screening for malignant neoplasm of colon: Secondary | ICD-10-CM | POA: Diagnosis not present

## 2019-03-18 DIAGNOSIS — L309 Dermatitis, unspecified: Secondary | ICD-10-CM

## 2019-03-18 DIAGNOSIS — E1169 Type 2 diabetes mellitus with other specified complication: Secondary | ICD-10-CM | POA: Diagnosis not present

## 2019-03-18 DIAGNOSIS — M79644 Pain in right finger(s): Secondary | ICD-10-CM

## 2019-03-18 DIAGNOSIS — E785 Hyperlipidemia, unspecified: Secondary | ICD-10-CM | POA: Diagnosis not present

## 2019-03-18 DIAGNOSIS — W57XXXA Bitten or stung by nonvenomous insect and other nonvenomous arthropods, initial encounter: Secondary | ICD-10-CM

## 2019-03-18 DIAGNOSIS — L602 Onychogryphosis: Secondary | ICD-10-CM | POA: Diagnosis not present

## 2019-03-18 DIAGNOSIS — Z1159 Encounter for screening for other viral diseases: Secondary | ICD-10-CM | POA: Diagnosis not present

## 2019-03-18 DIAGNOSIS — Z114 Encounter for screening for human immunodeficiency virus [HIV]: Secondary | ICD-10-CM

## 2019-03-18 DIAGNOSIS — E119 Type 2 diabetes mellitus without complications: Secondary | ICD-10-CM | POA: Insufficient documentation

## 2019-03-18 DIAGNOSIS — Z1212 Encounter for screening for malignant neoplasm of rectum: Secondary | ICD-10-CM

## 2019-03-18 DIAGNOSIS — Z6841 Body Mass Index (BMI) 40.0 and over, adult: Secondary | ICD-10-CM

## 2019-03-18 DIAGNOSIS — M1A09X0 Idiopathic chronic gout, multiple sites, without tophus (tophi): Secondary | ICD-10-CM

## 2019-03-18 DIAGNOSIS — Z716 Tobacco abuse counseling: Secondary | ICD-10-CM | POA: Diagnosis not present

## 2019-03-18 MED ORDER — TRIAMCINOLONE ACETONIDE 0.05 % EX OINT: 1.0000 "application " | TOPICAL_OINTMENT | Freq: Two times a day (BID) | CUTANEOUS | 0 refills | Status: DC | PRN

## 2019-03-18 MED ORDER — ALLOPURINOL 100 MG PO TABS: 100.0000 mg | ORAL_TABLET | Freq: Every day | ORAL | 1 refills | Status: DC

## 2019-03-18 MED ORDER — INDOMETHACIN 50 MG PO CAPS: 50.0000 mg | ORAL_CAPSULE | Freq: Three times a day (TID) | ORAL | 1 refills | Status: DC | PRN

## 2019-03-18 MED ORDER — LISINOPRIL 10 MG PO TABS: 10.0000 mg | ORAL_TABLET | Freq: Every day | ORAL | 3 refills | Status: DC

## 2019-03-18 NOTE — Telephone Encounter (Signed)
Copied from [place] [phone]. Topic: Quick Communication - Rx Refill/Question >> Mar 18, 2019  3:39 PM Selinda Flavin B, NT wrote: **Landen with [facility] calling and states that this medication they only have in the tub and it will be $800 out of pocket for the patient. States that they have the 0.01 or 0.25% cream, but would need clarification on which the provider would like to substitute. Please advise.**  Medication: TRIAMCINOLONE ACETONIDE, TOP, 0.05 % OINT  Has the patient contacted their pharmacy? yes (Agent: If no, request that the patient contact the pharmacy for the refill.) (Agent: If yes, when and what did the pharmacy advise?)  Preferred Pharmacy (with phone number or street name): [facility] - [place] - [place]: Please be advised that RX refills may take up to 3 business days. We ask that you follow-up with your pharmacy.

## 2019-03-18 NOTE — Progress Notes (Signed)
Name: Cory Rivas   MRN: [id number]    DOB: [birth year]   Date:03/18/2019       Progress Note  Subjective  Chief Complaint  Chief Complaint  Patient presents with  . New Patient (Initial Visit)  . Medication Refill  . Hand Pain    middle right finger (has been broken before)  . Tick Removal    left leg behind knee, no rash was removed  . Rash    red on middle of chest onset years    HPI  HTN: Taking 5mg  lisinopril but has been out for over a month - states has had HTN in the past, but is mostly on this for kidney protection due to DM.  No chest pain or shortness of breath, no BLE edema.  Obesity: Highest weight was 268lbs; he is 254lbs today, but states has not been following his healthy diet lately.  Working out in the yard, not walking at this time, encouraged to restart healthy diet and exercise.   Chronic Neck Pain: Has ongoing neck pain, has a "crushed disc" in his neck - is planning to have surgical repair after COVID-19 Pandemic allows.  He is seeing Dr. Wynn [facility]er with [facility] for management. He is not doing Physical therapy any longer - this did not seem to help.   Hx Gout: Taking allopurinol daily; has indomethacin that he takes PRN which really helps.  Normally gets flares in bilateral feet and ankles, and the left knee.  He has had to go to the hospital before to get things under control.   HLD: Has not taken his pravastatin in a while because he ran out.  We will check labs today and determine best course of action.  Denies chest pain or shortness of breath, no myalgias.  RIGHT Middle Finger: Notes soreness ongoing intermittently, he has history of crush injury years ago.  He notes recent pain flare is improving, so we will not proceed with any intervention at this time.  Recent Tick Bite: Pulled tick off - tick was embedded but he is confident he removed the entire thing.  He has no rash at the site.  We discussed option for 1 time dose of Doxycyline 200mg , but he declines at  this time.   Rash: Rash to central chest: Present intermittently for 4 years - was on abdomen, now on chest.  Endorses itching, no pain or discharge from the area.  Smoking: Smoking 1.5ppd (cut back from 2ppd), has been smoking since he was about 53yo.  Will start Annual CT Scans at age 65yo. Has quit for about 8 months in the past. Not ready to quit.  Diabetes mellitus type 2 Checking sugars?  yes How often? Almost daily Range (low to high) over last two weeks:  Running average in the 110's - lowest 115-125. Does patient feel additional teaching/training would be helpful?  no Have they attended Diabetes education classes? no  Trying to limit white bread, white rice, white potatoes, sweets?  Has not been goo with diet lately.  Is wanting to get back on track Trying to limit sweetened drinks like iced tea, soft drinks, sports drinks, fruit juices?  Drinks SunDrop - advised must drink diet Checking feet every day/night?  yes Last eye exam:  Last year - reminded that he is due for this. Denies: Polyuria, polydipsia, polyphagia, vision changes, or neuropathy. Most recent A1C: No results found for: HGBA1C  We will recheck today. Last CMP Results : is due for repeat  today Urine Micro UTD? No Current Medication Management: Diabetic Medications: Metformin only - is compliant ACEI/ARB: Yes Statin: Was taking, but ran out.  Aspirin therapy: No   Patient Active Problem List   Diagnosis Date Noted  . Hyperlipidemia associated with type 2 diabetes mellitus ([place]) 03/18/2019  . Diabetes mellitus ([place]) 03/18/2019  . Idiopathic chronic gout of multiple sites without tophus 03/18/2019  . Essential hypertension 03/18/2019  . Dermatitis 03/18/2019  . Tobacco abuse counseling 03/18/2019  . Tick bite 03/18/2019  . Pain of right middle finger 03/18/2019  . Lumbar degenerative disc disease 11/22/2015  . Osteoarthritis of spine with radiculopathy, cervical region 11/22/2015  . Osteoarthritis of spine  with radiculopathy, lumbar region 11/22/2015    Past Surgical History:  Procedure Laterality Date  . CHOLECYSTECTOMY      Family History  Problem Relation Age of Onset  . Heart disease Mother   . Diabetes Mother   . Hyperlipidemia Mother   . Hypertension Mother   . Lung cancer Father   . Hypertension Father   . Stroke Father   . AAA (abdominal aortic aneurysm) Maternal Grandmother   . Heart attack Maternal Grandmother   . Diabetes Maternal Grandfather     Social History   Socioeconomic History  . Marital status: Divorced    Spouse name: Not on file  . Number of children: Not on file  . Years of education: 36  . Highest education level: 9th grade  Occupational History  . Occupation: disability  Social Needs  . Financial resource strain: Very hard  . Food insecurity:    Worry: Often true    Inability: Often true  . Transportation needs:    Medical: No    Non-medical: No  Tobacco Use  . Smoking status: Current Every Day Smoker    Packs/day: 1.50    Types: Cigarettes  . Smokeless tobacco: Never Used  Substance and Sexual Activity  . Alcohol use: No  . Drug use: No  . Sexual activity: Yes  Lifestyle  . Physical activity:    Days per week: 0 days    Minutes per session: 0 min  . Stress: To some extent  Relationships  . Social connections:    Talks on phone: Once a week    Gets together: Once a week    Attends religious service: Never    Active member of club or organization: No    Attends meetings of clubs or organizations: Never    Relationship status: Divorced  . Intimate partner violence:    Fear of current or ex partner: No    Emotionally abused: No    Physically abused: No    Forced sexual activity: No  Other Topics Concern  . Not on file  Social History Narrative  . Not on file     Current Outpatient Medications:  .  allopurinol (ZYLOPRIM) 100 MG tablet, Take 100 mg by mouth daily., Disp: , Rfl:  .  indomethacin (INDOCIN) 50 MG capsule, Take  1 capsule (50 mg total) by mouth 3 (three) times daily., Disp: 30 capsule, Rfl: 0 .  lisinopril (ZESTRIL) 5 MG tablet, Take 5 mg by mouth daily., Disp: , Rfl:  .  metFORMIN (GLUCOPHAGE) 1000 MG tablet, Take 1,000 mg by mouth daily., Disp: , Rfl:  .  naproxen (NAPROSYN) 500 MG tablet, Take 1 tablet (500 mg total) by mouth 2 (two) times daily with a meal., Disp: 30 tablet, Rfl: 0  No Known Allergies  I personally reviewed active  problem list, medication list, allergies, notes from last encounter, lab results with the patient/caregiver today.   ROS  Constitutional: Negative for fever or weight change.  Respiratory: Negative for cough and shortness of breath.   Cardiovascular: Negative for chest pain or palpitations.  Gastrointestinal: Negative for abdominal pain, no bowel changes.  Musculoskeletal: Negative for gait problem or joint swelling.  Skin: Negative for rash.  Neurological: Negative for dizziness or headache.  No other specific complaints in a complete review of systems (except as listed in HPI above).  Objective  Vitals:   03/18/19 0911  BP: (!) 150/92  Pulse: 96  Resp: 14  Temp: 98.1 F (36.7 C)  TempSrc: Oral  SpO2: 98%  Weight: 254 lb 11.2 oz (115.5 kg)  Height: 5\' 6"  (1.676 m)   Body mass index is 41.11 kg/m.  Physical Exam  Constitutional: Patient appears well-developed and well-nourished. No distress.  HENT: Head: Normocephalic and atraumatic.  Eyes: Conjunctivae and EOM are normal. No scleral icterus. Neck: Normal range of motion. Neck supple. No JVD present. No thyromegaly present.  Cardiovascular: Normal rate, regular rhythm and normal heart sounds.  No murmur heard. No BLE edema. Pulmonary/Chest: Effort normal and breath sounds normal. No respiratory distress. Musculoskeletal: Normal range of motion, no joint effusions. No gross deformities Neurological: Pt is alert and oriented to person, place, and time. No cranial nerve deficit. Coordination,  balance, strength, speech and gait are normal.  Skin: Skin is warm and dry. There is a vertical rash to the central chest that has erythematous base and white plaques that are slightly raised.  LEFT knee exhibits no rash.  Psychiatric: Patient has a normal mood and affect. behavior is normal. Judgment and thought content normal.  No results found for this or any previous visit (from the past 72 hour(s)).  Diabetic Foot Exam: Diabetic Foot Exam - Simple   Simple Foot Form Diabetic Foot exam was performed with the following findings:  Yes 03/18/2019 10:10 AM  Visual Inspection No deformities, no ulcerations, no other skin breakdown bilaterally:  Yes Sensation Testing Intact to touch and monofilament testing bilaterally:  Yes Pulse Check Posterior Tibialis and Dorsalis pulse intact bilaterally:  Yes Comments Bilateral toenails are overgrown and curled over.    PHQ2/9: Depression screen PHQ 2/9 03/18/2019  Decreased Interest 0  Down, Depressed, Hopeless 0  PHQ - 2 Score 0  Altered sleeping 0  Tired, decreased energy 0  Change in appetite 0  Feeling bad or failure about yourself  0  Trouble concentrating 0  Moving slowly or fidgety/restless 0  Suicidal thoughts 0  PHQ-9 Score 0  Difficult doing work/chores Not difficult at all   PHQ-2/9 Result is negative.    Fall Risk: Fall Risk  03/18/2019  Falls in the past year? 1  Number falls in past yr: 0  Injury with Fall? 0    Functional Status Survey: Is the patient deaf or have difficulty hearing?: No Does the patient have difficulty seeing, even when wearing glasses/contacts?: No Does the patient have difficulty concentrating, remembering, or making decisions?: No Does the patient have difficulty walking or climbing stairs?: No Does the patient have difficulty dressing or bathing?: No Does the patient have difficulty doing errands alone such as visiting a doctor's office or shopping?: No  Assessment & Plan  1. Idiopathic chronic  gout of multiple sites without tophus - allopurinol (ZYLOPRIM) 100 MG tablet; Take 1 tablet (100 mg total) by mouth daily.  Dispense: 90 tablet; Refill: 1 -  indomethacin (INDOCIN) 50 MG capsule; Take 1 capsule (50 mg total) by mouth 3 (three) times daily as needed (Gout Flare).  Dispense: 30 capsule; Refill: 1  2. Essential hypertension - Increase to 10mg , recheck in 2 weeks - lisinopril (ZESTRIL) 10 MG tablet; Take 1 tablet (10 mg total) by mouth daily.  Dispense: 90 tablet; Refill: 3  3. Dermatitis - TRIAMCINOLONE ACETONIDE, TOP, 0.05 % OINT; Apply 1 application topically 2 (two) times daily as needed (For Rash/Itching). Use for 7-10 days at a time, then 7 days off. Too strong for breast, genitals, armpits, or face  Dispense: 100 g; Refill: 0  4. Tobacco abuse counseling - Not ready to quit  5. Tick bite, initial encounter - Declines doxycycline 200mg  x1. Monitor for symptoms - reviewed with patient  6. Pain of right middle finger - Stable and improving.  7. Type 2 diabetes mellitus with other specified complication, without long-term current use of insulin ([place]) - Diet discussed.  Continue Metformin - COMPLETE METABOLIC PANEL WITH GFR - Lipid panel - Hemoglobin A1c - Microalbumin / creatinine urine ratio - Ambulatory referral to Podiatry  8. Hyperlipidemia associated with type 2 diabetes mellitus ([facility]) - Lipid panel  9. Long toenail - Ambulatory referral to Podiatry  10. Class 3 severe obesity due to excess calories with serious comorbidity and body mass index (BMI) of 40.0 to 44.9 in adult [facility]) - Discussed importance of 150 minutes of physical activity weekly, eat two servings of fish weekly, eat one serving of tree nuts ( cashews, pistachios, pecans, almonds.Marland Kitchen) every other day, eat 6 servings of fruit/vegetables daily and drink plenty of water and avoid sweet beverages.  - COMPLETE METABOLIC PANEL WITH GFR - Lipid panel - Hemoglobin A1c  11. Need for hepatitis C  screening test - Hepatitis C antibody  12. Encounter for screening for HIV - HIV Antibody (routine testing w rflx)  13. Encounter for colorectal cancer screening - Ambulatory referral to Gastroenterology

## 2019-03-19 LAB — COMPLETE METABOLIC PANEL WITH GFR
AG Ratio: 1.4 (calc) (ref 1.0–2.5)
ALT: 32 U/L (ref 9–46)
AST: 28 U/L (ref 10–35)
Albumin: 4.2 g/dL (ref 3.6–5.1)
Alkaline phosphatase (APISO): 59 U/L (ref 35–144)
BUN: 14 mg/dL (ref 7–25)
CO2: 27 mmol/L (ref 20–32)
Calcium: 9.3 mg/dL (ref 8.6–10.3)
Chloride: 104 mmol/L (ref 98–110)
Creat: 1.22 mg/dL (ref 0.70–1.33)
GFR, Est African American: 79 mL/min/{1.73_m2} (ref >=60)
GFR, Est Non African American: 68 mL/min/{1.73_m2} (ref >=60)
Globulin: 2.9 g/dL (calc) (ref 1.9–3.7)
Glucose, Bld: 85 mg/dL (ref 65–99)
Potassium: 4.5 mmol/L (ref 3.5–5.3)
Sodium: 140 mmol/L (ref 135–146)
Total Bilirubin: 0.5 mg/dL (ref 0.2–1.2)
Total Protein: 7.1 g/dL (ref 6.1–8.1)

## 2019-03-19 LAB — MICROALBUMIN / CREATININE URINE RATIO
Creatinine, Urine: 165 mg/dL (ref 20–320)
Microalb Creat Ratio: 845 mcg/mg creat — ABNORMAL HIGH (ref <30)
Microalb, Ur: 139.4 mg/dL

## 2019-03-19 LAB — HEMOGLOBIN A1C
Hgb A1c MFr Bld: 5.9 % of total Hgb — ABNORMAL HIGH (ref <5.7)
Mean Plasma Glucose: 123 (calc)
eAG (mmol/L): 6.8 (calc)

## 2019-03-19 LAB — HEPATITIS C ANTIBODY
Hepatitis C Ab: NONREACTIVE
SIGNAL TO CUT-OFF: 0.02 (ref <1.00)

## 2019-03-19 LAB — LIPID PANEL
Cholesterol: 235 mg/dL — ABNORMAL HIGH (ref <200)
HDL: 48 mg/dL (ref >=40)
LDL Cholesterol (Calc): 149 mg/dL (calc) — ABNORMAL HIGH
Non-HDL Cholesterol (Calc): 187 mg/dL (calc) — ABNORMAL HIGH (ref <130)
Total CHOL/HDL Ratio: 4.9 (calc) (ref <5.0)
Triglycerides: 235 mg/dL — ABNORMAL HIGH (ref <150)

## 2019-03-19 LAB — HIV ANTIBODY (ROUTINE TESTING W REFLEX): HIV 1&2 Ab, 4th Generation: NONREACTIVE

## 2019-03-19 MED ORDER — TRIAMCINOLONE ACETONIDE 0.1 % EX CREA: 1.0000 "application " | TOPICAL_CREAM | Freq: Two times a day (BID) | CUTANEOUS | 0 refills | Status: DC

## 2019-03-19 NOTE — Telephone Encounter (Signed)
Cream sent in replacement.

## 2019-03-20 ENCOUNTER — Other Ambulatory Visit: Payer: Self-pay | Admitting: Family Medicine

## 2019-03-20 DIAGNOSIS — R809 Proteinuria, unspecified: Secondary | ICD-10-CM

## 2019-03-20 DIAGNOSIS — E1169 Type 2 diabetes mellitus with other specified complication: Secondary | ICD-10-CM

## 2019-03-20 MED ORDER — ROSUVASTATIN CALCIUM 20 MG PO TABS: 20.0000 mg | ORAL_TABLET | Freq: Every day | ORAL | 3 refills | Status: DC

## 2019-03-23 ENCOUNTER — Other Ambulatory Visit: Payer: Self-pay | Admitting: Emergency Medicine

## 2019-03-23 DIAGNOSIS — R809 Proteinuria, unspecified: Secondary | ICD-10-CM | POA: Diagnosis not present

## 2019-03-23 MED ORDER — METFORMIN HCL 1000 MG PO TABS
1000.0000 mg | ORAL_TABLET | Freq: Every day | ORAL | 1 refills | Status: DC
Start: 2019-03-23 — End: 2019-03-24

## 2019-03-24 ENCOUNTER — Other Ambulatory Visit: Payer: Self-pay | Admitting: Family Medicine

## 2019-03-24 DIAGNOSIS — N182 Chronic kidney disease, stage 2 (mild): Secondary | ICD-10-CM

## 2019-03-24 DIAGNOSIS — R809 Proteinuria, unspecified: Secondary | ICD-10-CM

## 2019-03-24 LAB — MICROALBUMIN / CREATININE URINE RATIO
Creatinine, Urine: 220 mg/dL (ref 20–320)
Microalb Creat Ratio: 828 mcg/mg creat — ABNORMAL HIGH (ref <30)
Microalb, Ur: 182.2 mg/dL

## 2019-04-01 ENCOUNTER — Ambulatory Visit: Payer: Medicaid Other | Admitting: Family Medicine

## 2019-04-09 ENCOUNTER — Other Ambulatory Visit: Payer: Self-pay | Admitting: Emergency Medicine

## 2019-04-09 NOTE — Telephone Encounter (Signed)
Do not see Metformin on chart

## 2019-04-15 ENCOUNTER — Encounter: Payer: Self-pay | Admitting: Family Medicine

## 2019-04-15 ENCOUNTER — Other Ambulatory Visit: Payer: Self-pay

## 2019-04-15 ENCOUNTER — Ambulatory Visit: Payer: Medicaid Other | Admitting: Family Medicine

## 2019-04-15 VITALS — BP 124/80 | HR 76 | Temp 97.1°F | Resp 18 | Ht 67.0 in | Wt 251.3 lb

## 2019-04-15 DIAGNOSIS — E1169 Type 2 diabetes mellitus with other specified complication: Secondary | ICD-10-CM | POA: Diagnosis not present

## 2019-04-15 DIAGNOSIS — Z6841 Body Mass Index (BMI) 40.0 and over, adult: Secondary | ICD-10-CM

## 2019-04-15 DIAGNOSIS — L309 Dermatitis, unspecified: Secondary | ICD-10-CM

## 2019-04-15 DIAGNOSIS — N182 Chronic kidney disease, stage 2 (mild): Secondary | ICD-10-CM | POA: Insufficient documentation

## 2019-04-15 DIAGNOSIS — Z638 Other specified problems related to primary support group: Secondary | ICD-10-CM | POA: Insufficient documentation

## 2019-04-15 DIAGNOSIS — Z6839 Body mass index (BMI) 39.0-39.9, adult: Secondary | ICD-10-CM | POA: Insufficient documentation

## 2019-04-15 DIAGNOSIS — R809 Proteinuria, unspecified: Secondary | ICD-10-CM | POA: Diagnosis not present

## 2019-04-15 DIAGNOSIS — M1A09X0 Idiopathic chronic gout, multiple sites, without tophus (tophi): Secondary | ICD-10-CM

## 2019-04-15 DIAGNOSIS — Z716 Tobacco abuse counseling: Secondary | ICD-10-CM | POA: Diagnosis not present

## 2019-04-15 DIAGNOSIS — M4726 Other spondylosis with radiculopathy, lumbar region: Secondary | ICD-10-CM

## 2019-04-15 DIAGNOSIS — Z23 Encounter for immunization: Secondary | ICD-10-CM | POA: Diagnosis not present

## 2019-04-15 DIAGNOSIS — M4722 Other spondylosis with radiculopathy, cervical region: Secondary | ICD-10-CM | POA: Diagnosis not present

## 2019-04-15 DIAGNOSIS — I10 Essential (primary) hypertension: Secondary | ICD-10-CM

## 2019-04-15 DIAGNOSIS — E785 Hyperlipidemia, unspecified: Secondary | ICD-10-CM

## 2019-04-15 DIAGNOSIS — M5136 Other intervertebral disc degeneration, lumbar region: Secondary | ICD-10-CM

## 2019-04-15 HISTORY — DX: Other specified problems related to primary support group: Z63.8
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
## 2019-04-29 DIAGNOSIS — M109 Gout, unspecified: Secondary | ICD-10-CM | POA: Diagnosis not present

## 2019-04-29 DIAGNOSIS — R319 Hematuria, unspecified: Secondary | ICD-10-CM | POA: Diagnosis not present

## 2019-04-29 DIAGNOSIS — R809 Proteinuria, unspecified: Secondary | ICD-10-CM | POA: Diagnosis not present

## 2019-04-29 DIAGNOSIS — I10 Essential (primary) hypertension: Secondary | ICD-10-CM | POA: Diagnosis not present

## 2019-04-29 DIAGNOSIS — E1129 Type 2 diabetes mellitus with other diabetic kidney complication: Secondary | ICD-10-CM | POA: Diagnosis not present

## 2019-05-07 ENCOUNTER — Other Ambulatory Visit: Payer: Self-pay | Admitting: Nephrology

## 2019-05-07 DIAGNOSIS — R809 Proteinuria, unspecified: Secondary | ICD-10-CM

## 2019-05-15 ENCOUNTER — Ambulatory Visit
Admission: RE | Admit: 2019-05-15 | Discharge: 2019-05-15 | Disposition: A | Discharge status: Home / Self Care | Payer: Medicaid Other | Source: Ambulatory Visit | Attending: Nephrology | Admitting: Nephrology

## 2019-05-15 ENCOUNTER — Other Ambulatory Visit: Payer: Self-pay

## 2019-05-15 DIAGNOSIS — R809 Proteinuria, unspecified: Secondary | ICD-10-CM | POA: Insufficient documentation

## 2019-06-17 DIAGNOSIS — E1129 Type 2 diabetes mellitus with other diabetic kidney complication: Secondary | ICD-10-CM | POA: Diagnosis not present

## 2019-06-17 DIAGNOSIS — I10 Essential (primary) hypertension: Secondary | ICD-10-CM | POA: Diagnosis not present

## 2019-06-17 DIAGNOSIS — M109 Gout, unspecified: Secondary | ICD-10-CM | POA: Diagnosis not present

## 2019-06-17 DIAGNOSIS — R809 Proteinuria, unspecified: Secondary | ICD-10-CM | POA: Diagnosis not present

## 2019-06-18 ENCOUNTER — Other Ambulatory Visit: Payer: Self-pay

## 2019-06-18 ENCOUNTER — Encounter: Payer: Self-pay | Admitting: Family Medicine

## 2019-06-18 ENCOUNTER — Ambulatory Visit: Payer: Medicaid Other | Admitting: Family Medicine

## 2019-06-18 VITALS — BP 124/82 | HR 74 | Temp 97.3°F | Resp 18 | Ht 67.0 in | Wt 250.6 lb

## 2019-06-18 DIAGNOSIS — K089 Disorder of teeth and supporting structures, unspecified: Secondary | ICD-10-CM

## 2019-06-18 DIAGNOSIS — N182 Chronic kidney disease, stage 2 (mild): Secondary | ICD-10-CM | POA: Diagnosis not present

## 2019-06-18 DIAGNOSIS — Z638 Other specified problems related to primary support group: Secondary | ICD-10-CM

## 2019-06-18 DIAGNOSIS — E66813 Obesity, class 3: Secondary | ICD-10-CM

## 2019-06-18 DIAGNOSIS — Z125 Encounter for screening for malignant neoplasm of prostate: Secondary | ICD-10-CM

## 2019-06-18 DIAGNOSIS — E1169 Type 2 diabetes mellitus with other specified complication: Secondary | ICD-10-CM | POA: Diagnosis not present

## 2019-06-18 DIAGNOSIS — L309 Dermatitis, unspecified: Secondary | ICD-10-CM | POA: Diagnosis not present

## 2019-06-18 DIAGNOSIS — M4726 Other spondylosis with radiculopathy, lumbar region: Secondary | ICD-10-CM | POA: Diagnosis not present

## 2019-06-18 DIAGNOSIS — E785 Hyperlipidemia, unspecified: Secondary | ICD-10-CM | POA: Diagnosis not present

## 2019-06-18 DIAGNOSIS — I10 Essential (primary) hypertension: Secondary | ICD-10-CM | POA: Diagnosis not present

## 2019-06-18 DIAGNOSIS — M51369 Other intervertebral disc degeneration, lumbar region without mention of lumbar back pain or lower extremity pain: Secondary | ICD-10-CM

## 2019-06-18 DIAGNOSIS — M5136 Other intervertebral disc degeneration, lumbar region: Secondary | ICD-10-CM

## 2019-06-18 DIAGNOSIS — Z6841 Body Mass Index (BMI) 40.0 and over, adult: Secondary | ICD-10-CM

## 2019-06-18 DIAGNOSIS — M1A09X0 Idiopathic chronic gout, multiple sites, without tophus (tophi): Secondary | ICD-10-CM | POA: Diagnosis not present

## 2019-06-18 DIAGNOSIS — K047 Periapical abscess without sinus: Secondary | ICD-10-CM

## 2019-06-18 DIAGNOSIS — R809 Proteinuria, unspecified: Secondary | ICD-10-CM

## 2019-06-18 DIAGNOSIS — Z716 Tobacco abuse counseling: Secondary | ICD-10-CM

## 2019-06-18 MED ORDER — LISINOPRIL 10 MG PO TABS: 20.0000 mg | ORAL_TABLET | Freq: Every day | ORAL | 3 refills | Status: DC

## 2019-06-18 MED ORDER — AMOXICILLIN-POT CLAVULANATE 875-125 MG PO TABS: 1.0000 | ORAL_TABLET | Freq: Two times a day (BID) | ORAL | 0 refills | Status: AC
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
## 2019-06-19 LAB — HEMOGLOBIN A1C
Hgb A1c MFr Bld: 6.1 % of total Hgb — ABNORMAL HIGH (ref <5.7)
Mean Plasma Glucose: 128 (calc)
eAG (mmol/L): 7.1 (calc)

## 2019-06-19 LAB — LIPID PANEL
Cholesterol: 202 mg/dL — ABNORMAL HIGH (ref <200)
HDL: 37 mg/dL — ABNORMAL LOW (ref >=40)
LDL Cholesterol (Calc): 127 mg/dL (calc) — ABNORMAL HIGH
Non-HDL Cholesterol (Calc): 165 mg/dL (calc) — ABNORMAL HIGH (ref <130)
Total CHOL/HDL Ratio: 5.5 (calc) — ABNORMAL HIGH (ref <5.0)
Triglycerides: 250 mg/dL — ABNORMAL HIGH (ref <150)

## 2019-06-19 LAB — PSA: PSA: 0.9 ng/mL (ref <=4.0)

## 2019-06-23 ENCOUNTER — Ambulatory Visit: Payer: Self-pay | Admitting: *Deleted

## 2019-06-23 NOTE — Chronic Care Management (AMB) (Signed)
    Care Management   Unsuccessful Call Note 06/23/2019 Name: Cory Rivas MRN: [id number] DOB: August 21, 1966  Patient  is a 53 year old male who sees Raelyn Ensign, FNP for primary care. Raelyn Ensign, FNP asked the CCM team to consult the patient for housing resources.  Referral was placed 06/18/19 . Patient's last office visit was 06/18/19.     This social worker was unable to reach patient via telephone today to consent for CM services. I have left HIPAA compliant voicemail asking patient to return my call. (unsuccessful outreach #1).   Plan: Will follow-up within 7 business days via telephone.      Elliot Gurney, [place] [facility] [phone]

## 2019-06-26 ENCOUNTER — Ambulatory Visit: Payer: Self-pay | Admitting: *Deleted

## 2019-06-26 NOTE — Chronic Care Management (AMB) (Signed)
   Chronic Care Management   Unsuccessful Call Note 06/26/2019 Name: Cory Rivas MRN: [id number] DOB: Jun 05, 1966  Patient  is a 53 year old male who sees Thelma Barge, FNP for primary care. Raelyn Ensign, FNP asked the CCM team to consult the patient for housing resources. Referral was placed 06/18/19. Patient's last office visit was 06/18/19     This social worker was unable to reach patient via telephone today to consent for CCM services. I have left HIPAA compliant voicemail asking patient to return my call. (unsuccessful outreach #2).   Plan: Will follow-up within 7 business days via telephone.     Elliot Gurney, [place] Administrator, [facility] [phone]

## 2019-06-29 ENCOUNTER — Encounter: Payer: Self-pay | Admitting: *Deleted

## 2019-06-29 ENCOUNTER — Ambulatory Visit: Payer: Self-pay | Admitting: *Deleted

## 2019-06-29 NOTE — Progress Notes (Signed)
This encounter was created in error - please disregard.

## 2019-06-29 NOTE — Chronic Care Management (AMB) (Signed)
   Care Management   Unsuccessful Call Note 06/29/2019 Name: Cory Rivas MRN: [id number] DOB: 1966/02/26  Patient  is a 53year old male who sees Raelyn Ensign, FNP for primary care. Raelyn Ensign, FNP asked the CCM team to consult the patient for community resources related to housing.  Referral was placed 06/18/19. Patient's last office visit was 06/18/19.     This social worker was unable to reach patient via telephone today for consent for CM services and to offer available resources.  I have left HIPAA compliant voicemail asking patient to return my call. (unsuccessful outreach #3).   Plan: This Education officer, museum will not make any further outreach calls to patient, however will be happy to engage patient upon his return call.     Elliot Gurney, [place] Administrator, [facility] [phone]

## 2019-07-09 ENCOUNTER — Other Ambulatory Visit: Payer: Self-pay | Admitting: *Deleted

## 2019-07-09 DIAGNOSIS — Z20822 Contact with and (suspected) exposure to covid-19: Secondary | ICD-10-CM

## 2019-07-09 DIAGNOSIS — R6889 Other general symptoms and signs: Secondary | ICD-10-CM | POA: Diagnosis not present

## 2019-07-10 LAB — NOVEL CORONAVIRUS, NAA: SARS-CoV-2, NAA: NOT DETECTED

## 2019-07-17 ENCOUNTER — Ambulatory Visit: Payer: Medicaid Other | Admitting: Family Medicine

## 2019-07-23 ENCOUNTER — Encounter: Payer: Self-pay | Admitting: Family Medicine

## 2019-07-23 ENCOUNTER — Ambulatory Visit: Payer: Medicaid Other | Admitting: Family Medicine

## 2019-07-23 ENCOUNTER — Other Ambulatory Visit: Payer: Self-pay

## 2019-07-23 VITALS — BP 138/86 | HR 90 | Temp 98.1°F | Resp 18 | Ht 67.0 in | Wt 252.8 lb

## 2019-07-23 DIAGNOSIS — Z23 Encounter for immunization: Secondary | ICD-10-CM | POA: Diagnosis not present

## 2019-07-23 DIAGNOSIS — M1A09X0 Idiopathic chronic gout, multiple sites, without tophus (tophi): Secondary | ICD-10-CM

## 2019-07-23 DIAGNOSIS — E1169 Type 2 diabetes mellitus with other specified complication: Secondary | ICD-10-CM | POA: Diagnosis not present

## 2019-07-23 MED ORDER — PREDNISONE 10 MG PO TABS: ORAL_TABLET | ORAL | 0 refills | Status: DC

## 2019-07-23 NOTE — Progress Notes (Signed)
Acute Office Visit  Subjective:    Patient ID: Cory Rivas, male    DOB: 05-Jun-1966, 53 y.o.   MRN: [id number]  Chief Complaint  Patient presents with  . Knee Pain    right knee, swolle, warm to touch for 1 week.Pain started at foot  . Gout    Patient is in today for complaints of gout flair up in the right foot last week but has now moved to right knee for 1 week. There was not injury mechanism. The pain is described as burning and stabbing especially with bending motions. Today the pain is 10/10 and effects his gait. Denies any numbness or tingling. He has tried ice and rest for the symptoms and used 3 tablets of indomethacin oday the pain is severe and affects his gait.    -Gout: taking allopurinol. Denies alcohol, aged cheese or dried meats or shellfish. Has made improvements with his diet habits. He notes that his urine is really foamy and clear which normally indicates a flair up of Gout. He has tried indomethacin (had 3 doses left over) without relief. Notes has benefited from steroid in the past.   -DM-using metformin and continues with glucose monitoring last reading of 125 at home denies polyuria, polydipsia, or blurred vision. Last A1C was 6.1%.  We will use prednisone today as he failed indomethacin treatment.   Requests the flu shot today  Past Medical History:  Diagnosis Date  . Bulging of cervical intervertebral disc   . Diabetes mellitus without complication ([place])   . Gout   . High cholesterol     Past Surgical History:  Procedure Laterality Date  . CHOLECYSTECTOMY      Family History  Problem Relation Age of Onset  . Heart disease Mother   . Diabetes Mother   . Hyperlipidemia Mother   . Hypertension Mother   . Lung cancer Father   . Hypertension Father   . Stroke Father   . AAA (abdominal aortic aneurysm) Maternal Grandmother   . Heart attack Maternal Grandmother   . Diabetes Maternal Grandfather     Social History   Socioeconomic History  .  Marital status: Divorced    Spouse name: Not on file  . Number of children: 0  . Years of education: 9  . Highest education level: 9th grade  Occupational History  . Occupation: disability  Social Needs  . Financial resource strain: Very hard  . Food insecurity    Worry: Often true    Inability: Often true  . Transportation needs    Medical: No    Non-medical: No  Tobacco Use  . Smoking status: Current Every Day Smoker    Packs/day: 1.50    Types: Cigarettes  . Smokeless tobacco: Never Used  Substance and Sexual Activity  . Alcohol use: No  . Drug use: No  . Sexual activity: Not Currently    Partners: Female  Lifestyle  . Physical activity    Days per week: 0 days    Minutes per session: 0 min  . Stress: To some extent  Relationships  . Social Herbalist on phone: Once a week    Gets together: Once a week    Attends religious service: Never    Active member of club or organization: No    Attends meetings of clubs or organizations: Never    Relationship status: Divorced  . Intimate partner violence    Fear of current or ex partner: No  Emotionally abused: No    Physically abused: No    Forced sexual activity: No  Other Topics Concern  . Not on file  Social History Narrative  . Not on file    Outpatient Medications Prior to Visit  Medication Sig Dispense Refill  . allopurinol (ZYLOPRIM) 100 MG tablet Take 1 tablet (100 mg total) by mouth daily. 90 tablet 1  . lisinopril (ZESTRIL) 10 MG tablet Take 2 tablets (20 mg total) by mouth daily. 90 tablet 3  . metFORMIN (GLUCOPHAGE) 1000 MG tablet Take by mouth.    . rosuvastatin (CRESTOR) 20 MG tablet Take 1 tablet (20 mg total) by mouth daily. 90 tablet 3  . triamcinolone cream (KENALOG) 0.1 % Apply 1 application topically 2 (two) times daily. 30 g 0   No facility-administered medications prior to visit.     No Known Allergies  Review of Systems  Constitutional: Negative for chills and fever.   Cardiovascular: Negative for chest pain.  Gastrointestinal: Negative for abdominal pain, nausea and vomiting.  Musculoskeletal: Positive for falls, joint pain and neck pain.  Skin: Negative.   Neurological: Negative for tingling, loss of consciousness, weakness and numbness.       Objective:    Physical Exam  Constitutional: He appears well-developed and well-nourished.  HENT:  Head: Normocephalic and atraumatic.  Eyes: Pupils are equal, round, and reactive to light.  Cardiovascular: Normal rate and normal heart sounds.  Pulmonary/Chest: Effort normal and breath sounds normal. No respiratory distress.  Musculoskeletal:     Right knee: He exhibits decreased range of motion, swelling and erythema. Tenderness found.  Neurological: He is alert.  Skin: Skin is warm, dry and intact.    BP 138/86 (BP Location: Right Arm, Patient Position: Sitting, Cuff Size: Large)   Pulse 90   Temp 98.1 F (36.7 C) (Oral)   Resp 18   Ht 5\' 7"  (1.702 m)   Wt 114.7 kg   SpO2 97%   BMI 39.59 kg/m  Wt Readings from Last 3 Encounters:  07/23/19 114.7 kg  06/18/19 113.7 kg  04/15/19 114 kg    Health Maintenance Due  Topic Date Due  . OPHTHALMOLOGY EXAM  02/12/1977  . COLONOSCOPY  02/12/2017  . INFLUENZA VACCINE  05/16/2019    There are no preventive care reminders to display for this patient.   Lab Results  Component Value Date   TSH 1.86 12/18/2017   No results found for: WBC, HGB, HCT, MCV, PLT Lab Results  Component Value Date   NA 140 03/18/2019   K 4.5 03/18/2019   CO2 27 03/18/2019   GLUCOSE 85 03/18/2019   BUN 14 03/18/2019   CREATININE 1.22 03/18/2019   BILITOT 0.5 03/18/2019   AST 28 03/18/2019   ALT 32 03/18/2019   PROT 7.1 03/18/2019   CALCIUM 9.3 03/18/2019   Lab Results  Component Value Date   CHOL 202 (H) 06/18/2019   Lab Results  Component Value Date   HDL 37 (L) 06/18/2019   Lab Results  Component Value Date   LDLCALC 127 (H) 06/18/2019   Lab  Results  Component Value Date   TRIG 250 (H) 06/18/2019   Lab Results  Component Value Date   CHOLHDL 5.5 (H) 06/18/2019   Lab Results  Component Value Date   HGBA1C 6.1 (H) 06/18/2019      Assessment & Plan:    1. Idiopathic chronic gout of multiple sites without tophus - predniSONE (DELTASONE) 10 MG tablet; Day1: take 4 tablets;  Day2: take 3 tablets; Day3: take 2 tabs, Day4: Take 1 tab  Dispense: 10 tablet; Refill: 0 - Failed indomethacin; discussed options for gout care; we will recheck Uric Acid levels after flare has ended to determine best course for his allopurinol.  May consider reaching out to nephrology to discuss if warranted based on labs.  2. Type 2 diabetes mellitus with other specified complication, without long-term current use of insulin ([place]) -Recommend monitoring glucose daily - if FBS >200 will call office; if polyuria, polydipsia, or polyphagia, will call office. - DM has been well controlled, however taking PO corticosteroid does pose risk of hyperglycemia; he is aware of this and will monitor closely; sick day care for DM is discussed in detail with him.  3. Needs flu shot - Flu Vaccine QUAD 6+ mos PF IM (Fluarix Quad PF)

## 2019-07-23 NOTE — Patient Instructions (Signed)
Gout  Gout is painful swelling of your joints. Gout is a type of arthritis. It is caused by having too much uric acid in your body. Uric acid is a chemical that is made when your body breaks down substances called purines. If your body has too much uric acid, sharp crystals can form and build up in your joints. This causes pain and swelling. Gout attacks can happen quickly and be very painful (acute gout). Over time, the attacks can affect more joints and happen more often (chronic gout). What are the causes?  Too much uric acid in your blood. This can happen because: ? Your kidneys do not remove enough uric acid from your blood. ? Your body makes too much uric acid. ? You eat too many foods that are high in purines. These foods include organ meats, some seafood, and beer.  Trauma or stress. What increases the risk?  Having a family history of gout.  Being male and middle-aged.  Being male and having gone through menopause.  Being very overweight (obese).  Drinking alcohol, especially beer.  Not having enough water in the body (being dehydrated).  Losing weight too quickly.  Having an organ transplant.  Having lead poisoning.  Taking certain medicines.  Having kidney disease.  Having a skin condition called psoriasis. What are the signs or symptoms? An attack of acute gout usually happens in just one joint. The most common place is the big toe. Attacks often start at night. Other joints that may be affected include joints of the feet, ankle, knee, fingers, wrist, or elbow. Symptoms of an attack may include:  Very bad pain.  Warmth.  Swelling.  Stiffness.  Shiny, red, or purple skin.  Tenderness. The affected joint may be very painful to touch.  Chills and fever. Chronic gout may cause symptoms more often. More joints may be involved. You may also have white or yellow lumps (tophi) on your hands or feet or in other areas near your joints. How is this  treated?  Treatment for this condition has two phases: treating an acute attack and preventing future attacks.  Acute gout treatment may include: ? NSAIDs. ? Steroids. These are taken by mouth or injected into a joint. ? Colchicine. This medicine relieves pain and swelling. It can be given by mouth or through an IV tube.  Preventive treatment may include: ? Taking small doses of NSAIDs or colchicine daily. ? Using a medicine that reduces uric acid levels in your blood. ? Making changes to your diet. You may need to see a food expert (dietitian) about what to eat and drink to prevent gout. Follow these instructions at home: During a gout attack   If told, put ice on the painful area: ? Put ice in a plastic bag. ? Place a towel between your skin and the bag. ? Leave the ice on for 20 minutes, 2-3 times a day.  Raise (elevate) the painful joint above the level of your heart as often as you can.  Rest the joint as much as possible. If the joint is in your leg, you may be given crutches.  Follow instructions from your doctor about what you cannot eat or drink. Avoiding future gout attacks  Eat a low-purine diet. Avoid foods and drinks such as: ? Liver. ? Kidney. ? Anchovies. ? Asparagus. ? Herring. ? Mushrooms. ? Mussels. ? Beer.  Stay at a healthy weight. If you want to lose weight, talk with your doctor. Do not lose weight   too fast.  Start or continue an exercise plan as told by your doctor. Eating and drinking  Drink enough fluids to keep your pee (urine) pale yellow.  If you drink alcohol: ? Limit how much you use to:  0-1 drink a day for women.  0-2 drinks a day for men. ? Be aware of how much alcohol is in your drink. In the [place] one drink equals one 12 oz bottle of beer (355 mL), one 5 oz glass of wine (148 mL), or one 1 oz glass of hard liquor (44 mL). General instructions  Take over-the-counter and prescription medicines only as told by your doctor.  Do  not drive or use heavy machinery while taking prescription pain medicine.  Return to your normal activities as told by your doctor. Ask your doctor what activities are safe for you.  Keep all follow-up visits as told by your doctor. This is important. Contact a doctor if:  You have another gout attack.  You still have symptoms of a gout attack after 10 days of treatment.  You have problems (side effects) because of your medicines.  You have chills or a fever.  You have burning pain when you pee (urinate).  You have pain in your lower back or belly. Get help right away if:  You have very bad pain.  Your pain cannot be controlled.  You cannot pee. Summary  Gout is painful swelling of the joints.  The most common site of pain is the big toe, but it can affect other joints.  Medicines and avoiding some foods can help to prevent and treat gout attacks. This information is not intended to replace advice given to you by your health care provider. Make sure you discuss any questions you have with your health care provider. Document Released: 07/10/2008 Document Revised: 04/23/2018 Document Reviewed: 04/23/2018 [employer]vier Patient Education  2020 [employer]  

## 2019-07-30 ENCOUNTER — Other Ambulatory Visit: Payer: Self-pay

## 2019-07-30 ENCOUNTER — Encounter: Payer: Self-pay | Admitting: Family Medicine

## 2019-07-30 ENCOUNTER — Ambulatory Visit: Payer: Medicaid Other | Admitting: Family Medicine

## 2019-07-30 VITALS — BP 142/78 | HR 83 | Temp 97.2°F | Resp 18 | Ht 67.0 in | Wt 251.1 lb

## 2019-07-30 DIAGNOSIS — Z0000 Encounter for general adult medical examination without abnormal findings: Secondary | ICD-10-CM

## 2019-07-30 NOTE — Progress Notes (Signed)
Name: Cory Rivas   MRN: [id number]    DOB: 1966/07/06   Date:07/30/2019       Progress Note  Subjective  Chief Complaint  Chief Complaint  Patient presents with  . Annual Exam    HPI  Patient presents for annual CPE  USPSTF grade A and B recommendations:  Diet: he has not been on his diet regimen, he drinks some sodas but is drinking more tea and water. No longer drinking [facility].  Exercise: walks around the yard, does not go to the gym regularly.  Gout: Since last visit gout flare in the right knee has resolved significantly, he has continued to monitor is glucose during the use of prednisone and now daily   Depression: phq 9 is negative Depression screen [facility] 2/9 07/30/2019 07/23/2019 06/18/2019 04/15/2019 03/18/2019  Decreased Interest 0 0 1 0 0  Down, Depressed, Hopeless 0 0 1 0 0  PHQ - 2 Score 0 0 2 0 0  Altered sleeping 0 0 0 0 0  Tired, decreased energy 0 0 0 0 0  Change in appetite 0 0 0 0 0  Feeling bad or failure about yourself  0 0 0 0 0  Trouble concentrating 0 0 0 0 0  Moving slowly or fidgety/restless 0 0 0 0 0  Suicidal thoughts 0 0 0 0 0  PHQ-9 Score 0 0 2 0 0  Difficult doing work/chores Not difficult at all Not difficult at all Not difficult at all Not difficult at all Not difficult at all    Hypertension:  BP Readings from Last 3 Encounters:  07/30/19 (!) 142/78  07/23/19 138/86  06/18/19 124/82    Obesity: Wt Readings from Last 3 Encounters:  07/30/19 113.9 kg  07/23/19 114.7 kg  06/18/19 113.7 kg   BMI Readings from Last 3 Encounters:  07/30/19 39.33 kg/m  07/23/19 39.59 kg/m  06/18/19 39.25 kg/m     Lipids:  Lab Results  Component Value Date   CHOL 202 (H) 06/18/2019   CHOL 235 (H) 03/18/2019   Lab Results  Component Value Date   HDL 37 (L) 06/18/2019   HDL 48 03/18/2019   Lab Results  Component Value Date   LDLCALC 127 (H) 06/18/2019   LDLCALC 149 (H) 03/18/2019   Lab Results  Component Value Date   TRIG 250 (H)  06/18/2019   TRIG 235 (H) 03/18/2019   Lab Results  Component Value Date   CHOLHDL 5.5 (H) 06/18/2019   CHOLHDL 4.9 03/18/2019   No results found for: LDLDIRECT Glucose:  Glucose, Bld  Date Value Ref Range Status  03/18/2019 85 65 - 99 mg/dL Final    Comment:    .            Fasting reference interval .    Glucose-Capillary  Date Value Ref Range Status  08/03/2018 112 (H) 70 - 99 mg/dL Final      Office Visit from 07/30/2019 in [facility]  AUDIT-C Score  0      Divorced  STD testing and prevention (HIV/chl/gon/syphilis): Not sexually active for the last 3-4 months. HIV negative in June 2020; declines additional testing Hep C: recent screening completed  Skin cancer: no history of skin cancer  Colorectal cancer: has not been screened, believes a distant Uncle had colorectal cancer  Prostate cancer: no family history Lab Results  Component Value Date   PSA 0.9 06/18/2019   IPSS Questionnaire (AUA-7): Over the past month.  1)  How often have you had a sensation of not emptying your bladder completely after you finish urinating?  0 - Not at all  2)  How often have you had to urinate again less than two hours after you finished urinating? 0 - Not at all  3)  How often have you found you stopped and started again several times when you urinated?  0 - Not at all  4) How difficult have you found it to postpone urination?  1 - Less than 1 time in 5  5) How often have you had a weak urinary stream?  0 - Not at all  6) How often have you had to push or strain to begin urination?  0 - Not at all  7) How many times did you most typically get up to urinate from the time you went to bed until the time you got up in the morning?  0 - None  Total score:  0-7 mildly symptomatic   8-19 moderately symptomatic   20-35 severely symptomatic   Lung cancer:  Current smoker - 1.5ppd x about 15 years.  Low Dose CT Chest recommended if Age 80-80 years, 30 pack-year  currently smoking OR have quit w/in 15years. Patient does not qualify.   AAA: N/A - will qualify at 53yo.  The USPSTF recommends one-time screening with ultrasonography in men ages 81 to 40 years who have ever smoked ECG: It has been 3-4 years since last EKG; denies chest pain, shortness of breath, palpitations.  Advanced Care Planning: A voluntary discussion about advance care planning including the explanation and discussion of advance directives.  Discussed health care proxy and Living will, and the patient was able to identify a health care proxy as Cory Rivas the patients sister.  Patient does not have a living will at present time. If patient does have living will, I have requested they bring this to the clinic to be scanned in to their chart.  Patient Active Problem List   Diagnosis Date Noted  . Class 3 severe obesity due to excess calories with serious comorbidity and body mass index (BMI) of 40.0 to 44.9 in adult ([place]) 04/15/2019  . Stress due to family tension 04/15/2019  . Chronic kidney disease (CKD) stage G2/A3, mildly decreased glomerular filtration rate (GFR) between 60-89 mL/min/1.73 square meter and albuminuria creatinine ratio greater than 300 mg/g 04/15/2019  . Albuminuria 04/15/2019  . Hyperlipidemia associated with type 2 diabetes mellitus ([place]) 03/18/2019  . Diabetes mellitus ([place]) 03/18/2019  . Idiopathic chronic gout of multiple sites without tophus 03/18/2019  . Essential hypertension 03/18/2019  . Dermatitis 03/18/2019  . Tobacco abuse counseling 03/18/2019  . Pain of right middle finger 03/18/2019  . Lumbar degenerative disc disease 11/22/2015  . Osteoarthritis of spine with radiculopathy, cervical region 11/22/2015  . Osteoarthritis of spine with radiculopathy, lumbar region 11/22/2015    Past Surgical History:  Procedure Laterality Date  . CHOLECYSTECTOMY      Family History  Problem Relation Age of Onset  . Heart disease Mother   . Diabetes Mother   .  Hyperlipidemia Mother   . Hypertension Mother   . Lung cancer Father   . Hypertension Father   . Stroke Father   . AAA (abdominal aortic aneurysm) Maternal Grandmother   . Heart attack Maternal Grandmother   . Diabetes Maternal Grandfather     Social History   Socioeconomic History  . Marital status: Divorced    Spouse name: Not on file  .  Number of children: 0  . Years of education: 9  . Highest education level: 9th grade  Occupational History  . Occupation: disability  Social Needs  . Financial resource strain: Somewhat hard  . Food insecurity    Worry: Sometimes true    Inability: Sometimes true  . Transportation needs    Medical: No    Non-medical: No  Tobacco Use  . Smoking status: Current Every Day Smoker    Packs/day: 1.50    Types: Cigarettes  . Smokeless tobacco: Never Used  Substance and Sexual Activity  . Alcohol use: No  . Drug use: No  . Sexual activity: Not Currently    Partners: Female  Lifestyle  . Physical activity    Days per week: 0 days    Minutes per session: 0 min  . Stress: To some extent  Relationships  . Social Herbalist on phone: Once a week    Gets together: Never    Attends religious service: Never    Active member of club or organization: No    Attends meetings of clubs or organizations: Never    Relationship status: Divorced  . Intimate partner violence    Fear of current or ex partner: No    Emotionally abused: No    Physically abused: No    Forced sexual activity: No  Other Topics Concern  . Not on file  Social History Narrative  . Not on file     Current Outpatient Medications:  .  allopurinol (ZYLOPRIM) 100 MG tablet, Take 1 tablet (100 mg total) by mouth daily., Disp: 90 tablet, Rfl: 1 .  lisinopril (ZESTRIL) 10 MG tablet, Take 2 tablets (20 mg total) by mouth daily., Disp: 90 tablet, Rfl: 3 .  metFORMIN (GLUCOPHAGE) 1000 MG tablet, Take by mouth., Disp: , Rfl:  .  rosuvastatin (CRESTOR) 20 MG tablet,  Take 1 tablet (20 mg total) by mouth daily., Disp: 90 tablet, Rfl: 3 .  predniSONE (DELTASONE) 10 MG tablet, Day1: take 4 tablets; Day2: take 3 tablets; Day3: take 2 tabs, Day4: Take 1 tab (Patient not taking: Reported on 07/30/2019), Disp: 10 tablet, Rfl: 0 .  triamcinolone cream (KENALOG) 0.1 %, Apply 1 application topically 2 (two) times daily. (Patient not taking: Reported on 07/30/2019), Disp: 30 g, Rfl: 0  No Known Allergies   Review of Systems  Constitutional: Negative.   HENT: Negative.   Eyes: Negative.   Respiratory: Negative.  Negative for cough and shortness of breath.   Cardiovascular: Negative.  Negative for chest pain and palpitations.  Gastrointestinal: Negative.  Negative for abdominal pain, constipation and diarrhea.  Genitourinary: Negative.   Musculoskeletal: Positive for joint pain (residual knee pain from recent gout flare).  Skin: Positive for rash.  Neurological: Negative.   Endo/Heme/Allergies: Positive for environmental allergies.  Psychiatric/Behavioral: Negative.        Objective  Vitals:   07/30/19 1245  BP: (!) 142/78  Pulse: 83  Resp: 18  Temp: (!) 97.2 F (36.2 C)  TempSrc: Temporal  SpO2: 97%  Weight: 113.9 kg  Height: 5\' 7"  (1.702 m)    Body mass index is 39.33 kg/m.  Physical Exam Vitals signs reviewed.  Constitutional:      General: He is not in acute distress.    Appearance: Normal appearance. He is obese.  HENT:     Head: Normocephalic and atraumatic.     Right Ear: Tympanic membrane, ear canal and external ear normal.     Left  Ear: Tympanic membrane, ear canal and external ear normal.     Nose: Nose normal. No congestion or rhinorrhea.     Mouth/Throat:     Mouth: Mucous membranes are moist.     Pharynx: Oropharynx is clear. No posterior oropharyngeal erythema.  Eyes:     Extraocular Movements: Extraocular movements intact.     Conjunctiva/sclera: Conjunctivae normal.     Pupils: Pupils are equal, round, and reactive to  light.  Neck:     Musculoskeletal: Normal range of motion and neck supple. No neck rigidity or muscular tenderness.     Thyroid: No thyroid mass or thyroid tenderness.     Trachea: Trachea normal.  Cardiovascular:     Rate and Rhythm: Normal rate and regular rhythm.     Pulses: Normal pulses.     Heart sounds: Normal heart sounds. No murmur. No friction rub. No gallop.   Pulmonary:     Effort: Pulmonary effort is normal. No respiratory distress.     Breath sounds: Normal breath sounds. No wheezing.  Abdominal:     General: There is no distension.     Palpations: Abdomen is soft. There is no mass.     Tenderness: There is no abdominal tenderness.  Musculoskeletal: Normal range of motion.        General: No swelling, deformity or signs of injury.     Right knee: He exhibits normal range of motion, no swelling, no deformity and normal patellar mobility. Tenderness (residual from recent gout flare) found.     Right lower leg: No edema.     Left lower leg: No edema.  Lymphadenopathy:     Cervical: No cervical adenopathy.  Skin:    General: Skin is warm and dry.     Capillary Refill: Capillary refill takes less than 2 seconds.     Findings: No bruising, erythema or rash.  Neurological:     General: No focal deficit present.     Mental Status: He is alert and oriented to person, place, and time. Mental status is at baseline.     Cranial Nerves: Cranial nerves are intact.     Gait: Gait is intact.  Psychiatric:        Attention and Perception: Attention and perception normal.        Mood and Affect: Mood normal.        Behavior: Behavior normal. Behavior is cooperative.        Thought Content: Thought content normal.        Cognition and Memory: Cognition and memory normal.        Judgment: Judgment normal.     Recent Results (from the past 2160 hour(s))  Lipid panel     Status: Abnormal   Collection Time: 06/18/19 12:00 AM  Result Value Ref Range   Cholesterol 202 (H) <200  mg/dL   HDL 37 (L) > OR = 40 mg/dL   Triglycerides 250 (H) <150 mg/dL    Comment: . If a non-fasting specimen was collected, consider repeat triglyceride testing on a fasting specimen if clinically indicated.  Yates Decamp et al. J. of Clin. Lipidol. 9381;0:175-102. Marland Kitchen    LDL Cholesterol (Calc) 127 (H) mg/dL (calc)    Comment: Reference range: <100 . Desirable range <100 mg/dL for primary prevention;   <70 mg/dL for patients with CHD or diabetic patients  with > or = 2 CHD risk factors. Marland Kitchen LDL-C is now calculated using the Martin-Hopkins  calculation, which is a validated novel method providing  better accuracy than the Friedewald equation in the  estimation of LDL-C.  Cresenciano Genre et al. Annamaria Helling. 1610;960(45): 2061-2068  (http://education.[facility].com/faq/FAQ164)    Total CHOL/HDL Ratio 5.5 (H) <5.0 (calc)   Non-HDL Cholesterol (Calc) 165 (H) <130 mg/dL (calc)    Comment: For patients with diabetes plus 1 major ASCVD risk  factor, treating to a non-HDL-C goal of <100 mg/dL  (LDL-C of <70 mg/dL) is considered a therapeutic  option.   PSA     Status: None   Collection Time: 06/18/19 12:00 AM  Result Value Ref Range   PSA 0.9 < OR = 4.0 ng/mL    Comment: The total PSA value from this assay system is  standardized against the WHO standard. The test  result will be approximately 20% lower when compared  to the equimolar-standardized total PSA (Beckman  Coulter). Comparison of serial PSA results should be  interpreted with this fact in mind. . This test was performed using the Siemens  chemiluminescent method. Values obtained from  different assay methods cannot be used interchangeably. PSA levels, regardless of value, should not be interpreted as absolute evidence of the presence or absence of disease.   Hemoglobin A1c     Status: Abnormal   Collection Time: 06/18/19 12:00 AM  Result Value Ref Range   Hgb A1c MFr Bld 6.1 (H) <5.7 % of total Hgb    Comment: For someone  without known diabetes, a hemoglobin  A1c value between 5.7% and 6.4% is consistent with prediabetes and should be confirmed with a  follow-up test. . For someone with known diabetes, a value <7% indicates that their diabetes is well controlled. A1c targets should be individualized based on duration of diabetes, age, comorbid conditions, and other considerations. . This assay result is consistent with an increased risk of diabetes. . Currently, no consensus exists regarding use of hemoglobin A1c for diagnosis of diabetes for children. .    Mean Plasma Glucose 128 (calc)   eAG (mmol/L) 7.1 (calc)  Novel Coronavirus, NAA ([facility])     Status: None   Collection Time: 07/09/19 12:00 AM   Specimen: Oropharyngeal(OP) collection in vial transport medium   OROPHARYNGEA  TESTING  Result Value Ref Range   SARS-CoV-2, NAA Not Detected Not Detected    Comment: Testing was performed using the cobas(R) SARS-CoV-2 test. This nucleic acid amplification test was developed and its performance characteristics determined by [employer]. Nucleic acid amplification tests include PCR and TMA. This test has not been FDA cleared or approved. This test has been authorized by FDA under an Emergency Use Authorization (EUA). This test is only authorized for the duration of time the declaration that circumstances exist justifying the authorization of the emergency use of in vitro diagnostic tests for detection of SARS-CoV-2 virus and/or diagnosis of COVID-19 infection under section 564(b)(1) of the Act, 21 U.S.C. 409WJX-9(J) (1), unless the authorization is terminated or revoked sooner. When diagnostic testing is negative, the possibility of a false negative result should be considered in the context of a patient's recent exposures and the presence of clinical signs and symptoms consistent with COVID-19. An individual without symptoms  of COVID-19 and who is not shedding SARS-CoV-2 virus would  expect to have a negative (not detected) result in this assay.      PHQ2/9: Depression screen [facility] 2/9 07/30/2019 07/23/2019 06/18/2019 04/15/2019 03/18/2019  Decreased Interest 0 0 1 0 0  Down, Depressed, Hopeless 0 0 1 0 0  PHQ - 2 Score 0 0 2  0 0  Altered sleeping 0 0 0 0 0  Tired, decreased energy 0 0 0 0 0  Change in appetite 0 0 0 0 0  Feeling bad or failure about yourself  0 0 0 0 0  Trouble concentrating 0 0 0 0 0  Moving slowly or fidgety/restless 0 0 0 0 0  Suicidal thoughts 0 0 0 0 0  PHQ-9 Score 0 0 2 0 0  Difficult doing work/chores Not difficult at all Not difficult at all Not difficult at all Not difficult at all Not difficult at all   Fall Risk: Fall Risk  07/30/2019 07/23/2019 06/18/2019 04/15/2019 03/18/2019  Falls in the past year? 0 1 0 0 1  Number falls in past yr: 0 1 0 0 0  Injury with Fall? 0 1 0 0 0  Follow up Falls evaluation completed Falls evaluation completed Falls evaluation completed Falls evaluation completed -    Assessment & Plan  1. Annual physical exam -Prostate cancer screening and PSA options (with potential risks and benefits of testing vs not testing) were discussed along with recent recs/guidelines. -USPSTF grade A and B recommendations reviewed with patient; age-appropriate recommendations, preventive care, screening tests, etc discussed and encouraged; healthy living encouraged; see AVS for patient education given to patient -Discussed importance of 150 minutes of physical activity weekly, eat two servings of fish weekly, eat one serving of tree nuts ( cashews, pistachios, pecans, almonds.Marland Kitchen) every other day, eat 6 servings of fruit/vegetables daily and drink plenty of water and avoid sweet beverages.  -Reviewed Health Maintenance:

## 2019-08-01 ENCOUNTER — Other Ambulatory Visit: Payer: Self-pay

## 2019-08-01 ENCOUNTER — Emergency Department
Admission: EM | Admit: 2019-08-01 | Discharge: 2019-08-01 | Disposition: A | Discharge status: Home / Self Care | Payer: Medicaid Other | Attending: Emergency Medicine | Admitting: Emergency Medicine

## 2019-08-01 ENCOUNTER — Encounter: Payer: Self-pay | Admitting: *Deleted

## 2019-08-01 ENCOUNTER — Emergency Department: Payer: Medicaid Other

## 2019-08-01 DIAGNOSIS — I129 Hypertensive chronic kidney disease with stage 1 through stage 4 chronic kidney disease, or unspecified chronic kidney disease: Secondary | ICD-10-CM | POA: Diagnosis not present

## 2019-08-01 DIAGNOSIS — E1122 Type 2 diabetes mellitus with diabetic chronic kidney disease: Secondary | ICD-10-CM | POA: Insufficient documentation

## 2019-08-01 DIAGNOSIS — Z79899 Other long term (current) drug therapy: Secondary | ICD-10-CM | POA: Diagnosis not present

## 2019-08-01 DIAGNOSIS — F17210 Nicotine dependence, cigarettes, uncomplicated: Secondary | ICD-10-CM | POA: Diagnosis not present

## 2019-08-01 DIAGNOSIS — Z7984 Long term (current) use of oral hypoglycemic drugs: Secondary | ICD-10-CM | POA: Insufficient documentation

## 2019-08-01 DIAGNOSIS — N182 Chronic kidney disease, stage 2 (mild): Secondary | ICD-10-CM | POA: Insufficient documentation

## 2019-08-01 DIAGNOSIS — M546 Pain in thoracic spine: Secondary | ICD-10-CM | POA: Diagnosis not present

## 2019-08-01 DIAGNOSIS — S299XXA Unspecified injury of thorax, initial encounter: Secondary | ICD-10-CM | POA: Diagnosis not present

## 2019-08-01 MED ORDER — METHOCARBAMOL 500 MG PO TABS: 500.0000 mg | ORAL_TABLET | Freq: Three times a day (TID) | ORAL | 0 refills | Status: AC | PRN

## 2019-08-01 MED ORDER — KETOROLAC TROMETHAMINE 10 MG PO TABS: 10.0000 mg | ORAL_TABLET | Freq: Four times a day (QID) | ORAL | 0 refills | Status: AC | PRN

## 2019-08-01 MED ORDER — KETOROLAC TROMETHAMINE 30 MG/ML IJ SOLN
30.0000 mg | Freq: Once | INTRAMUSCULAR | Status: AC
  Administered 2019-08-01: 30 mg via INTRAMUSCULAR
  Filled 2019-08-01: qty 1

## 2019-08-01 NOTE — ED Triage Notes (Signed)
2 weeks ago pt fell off porch after slipping on steps and hit 3 steps down and broke the step. Over the past 3 days increased pain. Pain is in lower back and comes around sides. Pt has a hx of nerve pain and compressed discs and this has aggravated the pain Hx of gout in right knee which resolved a few days ago. Pt states it hurts when he breaths

## 2019-08-01 NOTE — ED Provider Notes (Signed)
[facility] Emergency Department Provider Note  ____________________________________________  Time seen: Approximately 10:26 PM  I have reviewed the triage vital signs and the nursing notes.   HISTORY  Chief Complaint Back Pain    HPI Cory Rivas is a 53 y.o. male presents to the emergency department with upper back pain.  Patient reports that he experienced a mechanical, non-syncopal fall 3 weeks ago.  He states that he tripped and fell down approximately 3 wooden steps.  Patient states that he immediately felt pain in his upper back but states that pain improved over several days.  Patient states that yesterday, he was startled and felt a pop in his upper back.  Patient states that he has experienced pain since.  He denies numbness or tingling in the upper and lower extremities.  He has a extensive history of cervical and lumbar spine issues.  Denies bowel or bladder incontinence or saddle anesthesia.  Patient has been able to ambulate without difficulty.        Past Medical History:  Diagnosis Date  . Bulging of cervical intervertebral disc   . Diabetes mellitus without complication ([place])   . Gout   . High cholesterol     Patient Active Problem List   Diagnosis Date Noted  . Class 3 severe obesity due to excess calories with serious comorbidity and body mass index (BMI) of 40.0 to 44.9 in adult ([place]) 04/15/2019  . Stress due to family tension 04/15/2019  . Chronic kidney disease (CKD) stage G2/A3, mildly decreased glomerular filtration rate (GFR) between 60-89 mL/min/1.73 square meter and albuminuria creatinine ratio greater than 300 mg/g 04/15/2019  . Albuminuria 04/15/2019  . Hyperlipidemia associated with type 2 diabetes mellitus ([place]) 03/18/2019  . Diabetes mellitus ([place]) 03/18/2019  . Idiopathic chronic gout of multiple sites without tophus 03/18/2019  . Essential hypertension 03/18/2019  . Dermatitis 03/18/2019  . Tobacco abuse counseling  03/18/2019  . Pain of right middle finger 03/18/2019  . Lumbar degenerative disc disease 11/22/2015  . Osteoarthritis of spine with radiculopathy, cervical region 11/22/2015  . Osteoarthritis of spine with radiculopathy, lumbar region 11/22/2015    Past Surgical History:  Procedure Laterality Date  . CHOLECYSTECTOMY      Prior to Admission medications   Medication Sig Start Date End Date Taking? Authorizing Provider  allopurinol (ZYLOPRIM) 100 MG tablet Take 1 tablet (100 mg total) by mouth daily. 03/18/19   Hubbard Hartshorn, FNP  ketorolac (TORADOL) 10 MG tablet Take 1 tablet (10 mg total) by mouth every 6 (six) hours as needed for up to 5 days. 08/01/19 08/06/19  Lannie Fields, PA-C  lisinopril (ZESTRIL) 10 MG tablet Take 2 tablets (20 mg total) by mouth daily. 06/18/19   Hubbard Hartshorn, FNP  metFORMIN (GLUCOPHAGE) 1000 MG tablet Take by mouth.    [provider]  methocarbamol (ROBAXIN) 500 MG tablet Take 1 tablet (500 mg total) by mouth every 8 (eight) hours as needed for up to 5 days. 08/01/19 08/06/19  Lannie Fields, PA-C  rosuvastatin (CRESTOR) 20 MG tablet Take 1 tablet (20 mg total) by mouth daily. 03/20/19   Hubbard Hartshorn, FNP  triamcinolone cream (KENALOG) 0.1 % Apply 1 application topically 2 (two) times daily. 03/19/19   Hubbard Hartshorn, FNP    Allergies Patient has no known allergies.  Family History  Problem Relation Age of Onset  . Heart disease Mother   . Diabetes Mother   . Hyperlipidemia Mother   .  Hypertension Mother   . Lung cancer Father   . Hypertension Father   . Stroke Father   . AAA (abdominal aortic aneurysm) Maternal Grandmother   . Heart attack Maternal Grandmother   . Diabetes Maternal Grandfather     Social History Social History   Tobacco Use  . Smoking status: Current Every Day Smoker    Packs/day: 1.50    Types: Cigarettes  . Smokeless tobacco: Never Used  Substance Use Topics  . Alcohol use: No  . Drug use: No     Review of  Systems  Constitutional: No fever/chills Eyes: No visual changes. No discharge ENT: No upper respiratory complaints. Cardiovascular: no chest pain. Respiratory: no cough. No SOB. Gastrointestinal: No abdominal pain.  No nausea, no vomiting.  No diarrhea.  No constipation. Musculoskeletal: Patient has upper back pain.  Skin: Negative for rash, abrasions, lacerations, ecchymosis. Neurological: Negative for headaches, focal weakness or numbness.   ____________________________________________   PHYSICAL EXAM:  VITAL SIGNS: ED Triage Vitals  Enc Vitals Group     BP 08/01/19 2145 (!) 176/92     Pulse Rate 08/01/19 2145 80     Resp 08/01/19 2145 18     Temp 08/01/19 2145 98.7 F (37.1 C)     Temp Source 08/01/19 2145 Oral     SpO2 08/01/19 2145 96 %     Weight 08/01/19 2139 251 lb 5.2 oz (114 kg)     Height 08/01/19 2139 5\' 7"  (1.702 m)     Head Circumference --      Peak Flow --      Pain Score 08/01/19 2138 8     Pain Loc --      Pain Edu? --      Excl. in [place]? --      Constitutional: Alert and oriented. Well appearing and in no acute distress. Eyes: Conjunctivae are normal. PERRL. EOMI. Head: Atraumatic. Cardiovascular: Normal rate, regular rhythm. Normal S1 and S2.  Good peripheral circulation. Respiratory: Normal respiratory effort without tachypnea or retractions. Lungs CTAB. Good air entry to the bases with no decreased or absent breath sounds. Gastrointestinal: Bowel sounds 4 quadrants. Soft and nontender to palpation. No guarding or rigidity. No palpable masses. No distention. No CVA tenderness. Musculoskeletal: Full range of motion to all extremities. No gross deformities appreciated. Patient has paraspinal muscle tenderness along the thoracic spine.  Neurologic:  Normal speech and language. No gross focal neurologic deficits are appreciated.  Skin:  Skin is warm, dry and intact. No rash noted. Psychiatric: Mood and affect are normal. Speech and behavior are normal.  Patient exhibits appropriate insight and judgement.   ____________________________________________   LABS (all labs ordered are listed, but only abnormal results are displayed)  Labs Reviewed - No data to display ____________________________________________  EKG   ____________________________________________  RADIOLOGY I personally viewed and evaluated these images as part of my medical decision making, as well as reviewing the written report by the radiologist.  Dg Chest 2 View  Result Date: 08/01/2019 CLINICAL DATA:  Upper back pain after fall off porch 2 weeks ago. Patient reports hurts to breathe. EXAM: CHEST - 2 VIEW COMPARISON:  None. FINDINGS: The cardiomediastinal contours are normal. Mild interstitial coarsening likely related smoking. Pulmonary vasculature is normal. No consolidation, pleural effusion, or pneumothorax. No acute osseous abnormalities are seen. IMPRESSION: No acute chest findings. Electronically Signed   By: Keith Rake M.D.   On: 08/01/2019 22:23   Dg Thoracic Spine 2 View  Result Date:  08/01/2019 CLINICAL DATA:  Upper back pain after fall off porch 2 weeks ago. EXAM: THORACIC SPINE 2 VIEWS COMPARISON:  None. FINDINGS: The alignment is maintained. Vertebral body heights are maintained. No evidence of fracture. Posterior elements appear intact. Mild disc space narrowing and endplate spurring in the lower thoracic spine. There is no paravertebral soft tissue abnormality. IMPRESSION: Mild degenerative change in the thoracic spine without evidence of acute fracture. Electronically Signed   By: Keith Rake M.D.   On: 08/01/2019 22:24    ____________________________________________    PROCEDURES  Procedure(s) performed:    Procedures    Medications  ketorolac (TORADOL) 30 MG/ML injection 30 mg (30 mg Intramuscular Given 08/01/19 2331)     ____________________________________________   INITIAL IMPRESSION / ASSESSMENT AND PLAN / ED  COURSE  Pertinent labs & imaging results that were available during my care of the patient were reviewed by me and considered in my medical decision making (see chart for details).  Review of the [place] CSRS was performed in accordance of the [place] prior to dispensing any controlled drugs.           Assessment and Plan:  Back Pain:  Patient presents to the emergency department with upper back pain after a fall that occurred 3 weeks ago.  Patient was mildly hypertensive at triage.  He was observed ambulating easily from waiting room to exam room.  Patient had some paraspinal muscle tenderness along the thoracic spine.  Differential diagnosis included muscle spasm versus fracture.  X-ray examination of the thoracic spine revealed no bony abnormality.  No evidence of pneumothorax on chest x-ray.  EKG revealed normal sinus rhythm without apparent arrhythmia.  Patient was given an injection of Toradol in the emergency department.  He was discharged with Toradol and Robaxin.  Return precautions were given.  All patient questions were answered.    ____________________________________________  FINAL CLINICAL IMPRESSION(S) / ED DIAGNOSES  Final diagnoses:  Acute thoracic back pain, unspecified back pain laterality      NEW MEDICATIONS STARTED DURING THIS VISIT:  ED Discharge Orders         Ordered    ketorolac (TORADOL) 10 MG tablet  Every 6 hours PRN     08/01/19 2335    methocarbamol (ROBAXIN) 500 MG tablet  Every 8 hours PRN     08/01/19 2335              This chart was dictated using voice recognition software/Dragon. Despite best efforts to proofread, errors can occur which can change the meaning. Any change was purely unintentional.    Lannie Fields, PA-C 08/01/19 2344    Nance Pear, MD 08/02/19 [phone]

## 2019-09-23 ENCOUNTER — Other Ambulatory Visit: Payer: Self-pay | Admitting: Family Medicine

## 2019-09-23 NOTE — Telephone Encounter (Signed)
Requested medication (s) are due for refill today: yes  Requested medication (s) are on the active medication list: yes  Last refill: historic provider  Future visit scheduled: yes  Notes to clinic:  Historic provider    Requested Prescriptions  Pending Prescriptions Disp Refills   metFORMIN (GLUCOPHAGE) 1000 MG tablet [Pharmacy Med Name: METFORMIN HCL 1000 MG TAB] 90 tablet     Sig: TAKE 1 TABLET BY MOUTH ONCE DAILY     Endocrinology:  Diabetes - Biguanides Passed - 09/23/2019  3:29 PM      Passed - Cr in normal range and within 360 days    Creat  Date Value Ref Range Status  03/18/2019 1.22 0.70 - 1.33 mg/dL Final    Comment:    For patients >72 years of age, the reference limit for Creatinine is approximately 13% higher for people identified as African-American. .          Passed - HBA1C is between 0 and 7.9 and within 180 days    Hemoglobin A1C  Date Value Ref Range Status  12/18/2017 5.8  Final   Hgb A1c MFr Bld  Date Value Ref Range Status  06/18/2019 6.1 (H) <5.7 % of total Hgb Final    Comment:    For someone without known diabetes, a hemoglobin  A1c value between 5.7% and 6.4% is consistent with prediabetes and should be confirmed with a  follow-up test. . For someone with known diabetes, a value <7% indicates that their diabetes is well controlled. A1c targets should be individualized based on duration of diabetes, age, comorbid conditions, and other considerations. . This assay result is consistent with an increased risk of diabetes. . Currently, no consensus exists regarding use of hemoglobin A1c for diagnosis of diabetes for children. .          Passed - eGFR in normal range and within 360 days    GFR, Est African American  Date Value Ref Range Status  03/18/2019 79 > OR = 60 mL/min/1.32m Final   GFR, Est Non African American  Date Value Ref Range Status  03/18/2019 68 > OR = 60 mL/min/1.731mFinal         Passed - Valid encounter  within last 6 months    Recent Outpatient Visits          1 month ago Annual physical exam   [facility]   2 months ago Idiopathic chronic gout of multiple sites without tophus   [facility]   3 months ago Dental infection   [facility]   5 months ago Essential hypertension   [facility]   6 months ago Idiopathic chronic gout of multiple sites without tophus   [facility]    Future Appointments            [facility] 3 [facility]

## 2019-10-19 ENCOUNTER — Ambulatory Visit: Payer: Medicaid Other | Admitting: Family Medicine

## 2019-10-20 ENCOUNTER — Ambulatory Visit (INDEPENDENT_AMBULATORY_CARE_PROVIDER_SITE_OTHER): Payer: Medicaid Other | Admitting: Family Medicine

## 2019-10-20 ENCOUNTER — Encounter: Payer: Self-pay | Admitting: Family Medicine

## 2019-10-20 DIAGNOSIS — E1169 Type 2 diabetes mellitus with other specified complication: Secondary | ICD-10-CM | POA: Diagnosis not present

## 2019-10-20 DIAGNOSIS — Z716 Tobacco abuse counseling: Secondary | ICD-10-CM | POA: Diagnosis not present

## 2019-10-20 DIAGNOSIS — E785 Hyperlipidemia, unspecified: Secondary | ICD-10-CM | POA: Diagnosis not present

## 2019-10-20 DIAGNOSIS — Z6841 Body Mass Index (BMI) 40.0 and over, adult: Secondary | ICD-10-CM | POA: Diagnosis not present

## 2019-10-20 DIAGNOSIS — I10 Essential (primary) hypertension: Secondary | ICD-10-CM

## 2019-10-20 DIAGNOSIS — M1A09X0 Idiopathic chronic gout, multiple sites, without tophus (tophi): Secondary | ICD-10-CM

## 2019-10-20 MED ORDER — ALLOPURINOL 100 MG PO TABS: 100.0000 mg | ORAL_TABLET | Freq: Every day | ORAL | 1 refills | Status: DC

## 2019-10-20 MED ORDER — ROSUVASTATIN CALCIUM 40 MG PO TABS: 40.0000 mg | ORAL_TABLET | Freq: Every day | ORAL | 1 refills | Status: DC

## 2019-10-20 MED ORDER — LISINOPRIL 20 MG PO TABS: 20.0000 mg | ORAL_TABLET | Freq: Every day | ORAL | 0 refills | Status: DC

## 2019-10-20 NOTE — Progress Notes (Signed)
Name: Cory Rivas   MRN: [id number]    DOB: 1966-08-30   Date:10/20/2019       Progress Note  Subjective  Chief Complaint  Chief Complaint  Patient presents with  . Medication Refill  . Hypertension    Denies any symptoms  . Diabetes    Average BS 115  . Gout    episode around Christmas    I connected with  [name] on 10/20/19 at  9:40 AM EST by telephone and verified that I am speaking with the correct person using two identifiers.  I discussed the limitations, risks, security and privacy concerns of performing an evaluation and management service by telephone and the availability of in person appointments. Staff also discussed with the patient that there may be a patient responsible charge related to this service. Patient Location: at home Provider Location: [facility]   HPI  HTN: Was taking 20 mg lisinoprilNo chest pain, dizziness , shortness of breath or BLE edema.  Albuminuria/CKD: Found on urine micro in  June 2020 and confirmed with repeat. Now seeing nephrology - taking ACE and denies side effects   Obesity: Highest weight was 268lbs; he is not sure of his weight today, he states he has not been eating as healthy since Thanksgiving. He states he will try to resume a healthy diet   Chronic Neck Pain: Has ongoing neck pain, has a "crushed disc" in his neck - is planning to have surgical repair after COVID-19 Pandemic allows. He is seeing Dr. Wynn Banker with [facility] for management. Surgery not scheduled yet   Hx Gout:Taking allopurinol daily. Last flare was Dec 2020, he states his episodes are random. He takes indomethacin prn , this episode was brief , lasted only a couple of days and is doing well now  WUJ:WJXBJY crestor 20mg  and tolerating well, denies myalgia . Reviewed labs with patient today , LDL was down from 149 to 127 but still not at goal, we will increase dose of Crestor to 40 mg   Smoking: Smoking 1.5 ppd (cut back from 2ppd), has  been smoking since he was about 54yo. Still not ready to quit  Diabetes mellitustype 2 Checking sugars?yes How often?Almost daily, and usually BID  Range (low to high) over last two weeks:Running average in the 118 , he states it went up to 125  Working on healthier diet - he likes vegetables, discussed increase fish intake Checking feet every day/night?yes Last eye exam:Last year - reminded that he is due for this. Denies: Polyuria, polydipsia, polyphagia, vision changes, or neuropathy. Most recent A1C:Last CMP Results: UTD Urine Micro UTD?Yes Current Medication Management: Diabetic Medications:Metformin 1000mg  once daily. ACEI/ARB:Yes Statin:Yes, we will increase dose today  Aspirin therapy:No   Patient Active Problem List   Diagnosis Date Noted  . Class 3 severe obesity due to excess calories with serious comorbidity and body mass index (BMI) of 40.0 to 44.9 in adult ([place]) 04/15/2019  . Stress due to family tension 04/15/2019  . Chronic kidney disease (CKD) stage G2/A3, mildly decreased glomerular filtration rate (GFR) between 60-89 mL/min/1.73 square meter and albuminuria creatinine ratio greater than 300 mg/g 04/15/2019  . Albuminuria 04/15/2019  . Hyperlipidemia associated with type 2 diabetes mellitus ([place]) 03/18/2019  . Diabetes mellitus ([place]) 03/18/2019  . Idiopathic chronic gout of multiple sites without tophus 03/18/2019  . Essential hypertension 03/18/2019  . Dermatitis 03/18/2019  . Tobacco abuse counseling 03/18/2019  . Pain of right middle finger 03/18/2019  . Lumbar  degenerative disc disease 11/22/2015  . Osteoarthritis of spine with radiculopathy, cervical region 11/22/2015  . Osteoarthritis of spine with radiculopathy, lumbar region 11/22/2015    Past Surgical History:  Procedure Laterality Date  . CHOLECYSTECTOMY      Family History  Problem Relation Age of Onset  . Heart disease Mother   . Diabetes Mother   . Hyperlipidemia Mother     . Hypertension Mother   . Lung cancer Father   . Hypertension Father   . Stroke Father   . AAA (abdominal aortic aneurysm) Maternal Grandmother   . Heart attack Maternal Grandmother   . Diabetes Maternal Grandfather     Social History   Socioeconomic History  . Marital status: Divorced    Spouse name: Not on file  . Number of children: 0  . Years of education: 9  . Highest education level: 9th grade  Occupational History  . Occupation: disability  Tobacco Use  . Smoking status: Current Every Day Smoker    Packs/day: 1.50    Types: Cigarettes    Start date: 10/19/1989  . Smokeless tobacco: Never Used  Substance and Sexual Activity  . Alcohol use: No  . Drug use: No  . Sexual activity: Not Currently    Partners: Female  Other Topics Concern  . Not on file  Social History Narrative  . Not on file   Social Determinants of Health   Financial Resource Strain: Medium Risk  . Difficulty of Paying Living Expenses: Somewhat hard  Food Insecurity: Food Insecurity Present  . Worried About Charity fundraiser in the Last Year: Sometimes true  . Ran Out of Food in the Last Year: Sometimes true  Transportation Needs: No Transportation Needs  . Lack of Transportation (Medical): No  . Lack of Transportation (Non-Medical): No  Physical Activity: Inactive  . Days of Exercise per Week: 0 days  . Minutes of Exercise per Session: 0 min  Stress: Stress Concern Present  . Feeling of Stress : To some extent  Social Connections: Severely Isolated  . Frequency of Communication with Friends and Family: Once a week  . Frequency of Social Gatherings with Friends and Family: Never  . Attends Religious Services: Never  . Active Member of Clubs or Organizations: No  . Attends Archivist Meetings: Never  . Marital Status: Divorced  Human resources officer Violence: Not At Risk  . Fear of Current or Ex-Partner: No  . Emotionally Abused: No  . Physically Abused: No  . Sexually Abused: No      Current Outpatient Medications:  .  allopurinol (ZYLOPRIM) 100 MG tablet, Take 1 tablet (100 mg total) by mouth daily., Disp: 90 tablet, Rfl: 1 .  ibuprofen (ADVIL) 200 MG tablet, Take 200-400 mg by mouth daily., Disp: , Rfl:  .  indomethacin (INDOCIN) 50 MG capsule, SMARTSIG:1 Capsule(s) By Mouth 3 Times Daily PRN, Disp: , Rfl:  .  lisinopril (ZESTRIL) 10 MG tablet, Take 2 tablets (20 mg total) by mouth daily., Disp: 90 tablet, Rfl: 3 .  metFORMIN (GLUCOPHAGE) 1000 MG tablet, TAKE 1 TABLET BY MOUTH ONCE DAILY, Disp: 90 tablet, Rfl: 2 .  rosuvastatin (CRESTOR) 20 MG tablet, Take 1 tablet (20 mg total) by mouth daily., Disp: 90 tablet, Rfl: 3 .  triamcinolone cream (KENALOG) 0.1 %, Apply 1 application topically 2 (two) times daily., Disp: 30 g, Rfl: 0  No Known Allergies  I personally reviewed active problem list, medication list, allergies, family history, social history, health maintenance with  the patient/caregiver today.   ROS  Ten systems reviewed and is negative except as mentioned in HPI   Objective  Virtual encounter, vitals not obtained.  There is no height or weight on file to calculate BMI.  Physical Exam  Awake, alert and oriented   PHQ2/9: Depression screen [facility] 2/9 10/20/2019 07/30/2019 07/23/2019 06/18/2019 04/15/2019  Decreased Interest 0 0 0 1 0  Down, Depressed, Hopeless 1 0 0 1 0  PHQ - 2 Score 1 0 0 2 0  Altered sleeping 2 0 0 0 0  Tired, decreased energy 0 0 0 0 0  Change in appetite 0 0 0 0 0  Feeling bad or failure about yourself  0 0 0 0 0  Trouble concentrating 0 0 0 0 0  Moving slowly or fidgety/restless 0 0 0 0 0  Suicidal thoughts 0 0 0 0 0  PHQ-9 Score 3 0 0 2 0  Difficult doing work/chores Not difficult at all Not difficult at all Not difficult at all Not difficult at all Not difficult at all   PHQ-2/9 Result is positive.  He states he is feeling fine, situation at home is better  Fall Risk: Fall Risk  10/20/2019 07/30/2019 07/23/2019 06/18/2019  04/15/2019  Falls in the past year? 1 0 1 0 0  Number falls in past yr: 0 0 1 0 0  Injury with Fall? 1 0 1 0 0  Comment hurt his back and elbow - - - -  Follow up - Falls evaluation completed Falls evaluation completed Falls evaluation completed Falls evaluation completed     Assessment & Plan  1. Type 2 diabetes mellitus with other specified complication, without long-term current use of insulin (HCC)  - rosuvastatin (CRESTOR) 40 MG tablet; Take 1 tablet (40 mg total) by mouth daily.  Dispense: 90 tablet; Refill: 1  2. Gout  - allopurinol (ZYLOPRIM) 100 MG tablet; Take 1 tablet (100 mg total) by mouth daily.  Dispense: 90 tablet; Refill: 1  3. Essential hypertension  - lisinopril (ZESTRIL) 20 MG tablet; Take 1 tablet (20 mg total) by mouth daily.  Dispense: 90 tablet; Refill: 0  4. Hyperlipidemia associated with type 2 diabetes mellitus (HCC)  - rosuvastatin (CRESTOR) 40 MG tablet; Take 1 tablet (40 mg total) by mouth daily.  Dispense: 90 tablet; Refill: 1  5. Class 3 severe obesity due to excess calories with serious comorbidity and body mass index (BMI) of 40.0 to 44.9 in adult ([place])   6. Tobacco abuse counseling  I discussed the assessment and treatment plan with the patient. The patient was provided an opportunity to ask questions and all were answered. The patient agreed with the plan and demonstrated an understanding of the instructions.   The patient was advised to call back or seek an in-person evaluation if the symptoms worsen or if the condition fails to improve as anticipated.  I provided 25 minutes of non-face-to-face time during this encounter.  Loistine Chance, MD

## 2019-12-02 ENCOUNTER — Other Ambulatory Visit: Payer: Self-pay

## 2019-12-02 ENCOUNTER — Encounter: Payer: Self-pay | Admitting: Emergency Medicine

## 2019-12-02 ENCOUNTER — Emergency Department
Admission: EM | Admit: 2019-12-02 | Discharge: 2019-12-02 | Disposition: A | Discharge status: Home / Self Care | Payer: Medicaid Other | Attending: Emergency Medicine | Admitting: Emergency Medicine

## 2019-12-02 DIAGNOSIS — F17210 Nicotine dependence, cigarettes, uncomplicated: Secondary | ICD-10-CM | POA: Diagnosis not present

## 2019-12-02 DIAGNOSIS — E1122 Type 2 diabetes mellitus with diabetic chronic kidney disease: Secondary | ICD-10-CM | POA: Insufficient documentation

## 2019-12-02 DIAGNOSIS — N183 Chronic kidney disease, stage 3 unspecified: Secondary | ICD-10-CM | POA: Diagnosis not present

## 2019-12-02 DIAGNOSIS — I129 Hypertensive chronic kidney disease with stage 1 through stage 4 chronic kidney disease, or unspecified chronic kidney disease: Secondary | ICD-10-CM | POA: Diagnosis not present

## 2019-12-02 DIAGNOSIS — Z7984 Long term (current) use of oral hypoglycemic drugs: Secondary | ICD-10-CM | POA: Insufficient documentation

## 2019-12-02 DIAGNOSIS — M109 Gout, unspecified: Secondary | ICD-10-CM | POA: Diagnosis not present

## 2019-12-02 DIAGNOSIS — Z79899 Other long term (current) drug therapy: Secondary | ICD-10-CM | POA: Insufficient documentation

## 2019-12-02 DIAGNOSIS — M25562 Pain in left knee: Secondary | ICD-10-CM | POA: Insufficient documentation

## 2019-12-02 LAB — GLUCOSE, CAPILLARY: Glucose-Capillary: 138 mg/dL — ABNORMAL HIGH (ref 70–99)

## 2019-12-02 MED ORDER — TRAMADOL HCL 50 MG PO TABS: 50.0000 mg | ORAL_TABLET | Freq: Four times a day (QID) | ORAL | 0 refills | Status: DC | PRN

## 2019-12-02 MED ORDER — PREDNISONE 10 MG PO TABS: ORAL_TABLET | ORAL | 0 refills | Status: DC

## 2019-12-02 NOTE — Discharge Instructions (Signed)
Follow-up with your primary care provider if any continued problems or concerns.  Return to the emergency department if any severe worsening of your symptoms.  Continue taking your regular medication as prescribed by your doctor.  Continue being careful with what you ate while taking the prednisone as she will see an elevation in your glucose level while taking this medication.

## 2019-12-02 NOTE — ED Triage Notes (Signed)
Pt reports is having a gout flare up in his left knee. Pt states has hx of the same and takes meds for it but they are not helping this time. Pt reports called his MD but she is on maternity leave so he had to come here.

## 2019-12-02 NOTE — ED Provider Notes (Signed)
[facility] Emergency Department Provider Note   ____________________________________________   First MD Initiated Contact with Patient 12/02/19 [phone]     (approximate)  I have reviewed the triage vital signs and the nursing notes.   HISTORY  Chief Complaint Gout and Knee Pain   HPI Cory Rivas is a 54 y.o. male presents today with complaint of a flareup of gout in his left knee.  Patient states he has history of same and takes what sounds like allopurinol once a day and has been taking indomethacin twice a day and frequently as instructed by his PCP  without any relief of his symptoms.  Patient states that this feels the same as his gout.  Patient also has a history of  Diabetes type 2 and chronic kidney disease.  Patient rates his pain as a 10/10.      Past Medical History:  Diagnosis Date  . Bulging of cervical intervertebral disc   . Diabetes mellitus without complication ([place])   . Gout   . High cholesterol     Patient Active Problem List   Diagnosis Date Noted  . Class 3 severe obesity due to excess calories with serious comorbidity and body mass index (BMI) of 40.0 to 44.9 in adult ([place]) 04/15/2019  . Stress due to family tension 04/15/2019  . Chronic kidney disease (CKD) stage G2/A3, mildly decreased glomerular filtration rate (GFR) between 60-89 mL/min/1.73 square meter and albuminuria creatinine ratio greater than 300 mg/g 04/15/2019  . Albuminuria 04/15/2019  . Hyperlipidemia associated with type 2 diabetes mellitus ([place]) 03/18/2019  . Diabetes mellitus ([place]) 03/18/2019  . Idiopathic chronic gout of multiple sites without tophus 03/18/2019  . Essential hypertension 03/18/2019  . Dermatitis 03/18/2019  . Tobacco abuse counseling 03/18/2019  . Pain of right middle finger 03/18/2019  . Lumbar degenerative disc disease 11/22/2015  . Osteoarthritis of spine with radiculopathy, cervical region 11/22/2015  . Osteoarthritis of spine with  radiculopathy, lumbar region 11/22/2015    Past Surgical History:  Procedure Laterality Date  . CHOLECYSTECTOMY      Prior to Admission medications   Medication Sig Start Date End Date Taking? Authorizing Provider  allopurinol (ZYLOPRIM) 100 MG tablet Take 1 tablet (100 mg total) by mouth daily. 10/20/19   Steele Sizer, MD  ibuprofen (ADVIL) 200 MG tablet Take 200-400 mg by mouth daily. 06/17/19   [provider]  indomethacin (INDOCIN) 50 MG capsule SMARTSIG:1 Capsule(s) By Mouth 3 Times Daily PRN 04/29/19   [provider]  lisinopril (ZESTRIL) 20 MG tablet Take 1 tablet (20 mg total) by mouth daily. 10/20/19   Steele Sizer, MD  metFORMIN (GLUCOPHAGE) 1000 MG tablet TAKE 1 TABLET BY MOUTH ONCE DAILY 09/24/19   Hubbard Hartshorn, FNP  predniSONE (DELTASONE) 10 MG tablet Take 4 tablets once a day for 5 days 12/02/19   Johnn Hai, PA-C  rosuvastatin (CRESTOR) 40 MG tablet Take 1 tablet (40 mg total) by mouth daily. 10/20/19   Steele Sizer, MD  traMADol (ULTRAM) 50 MG tablet Take 1 tablet (50 mg total) by mouth every 6 (six) hours as needed. 12/02/19   Johnn Hai, PA-C  triamcinolone cream (KENALOG) 0.1 % Apply 1 application topically 2 (two) times daily. 03/19/19   Hubbard Hartshorn, FNP    Allergies Patient has no known allergies.  Family History  Problem Relation Age of Onset  . Heart disease Mother   . Diabetes Mother   . Hyperlipidemia Mother   . Hypertension  Mother   . Lung cancer Father   . Hypertension Father   . Stroke Father   . AAA (abdominal aortic aneurysm) Maternal Grandmother   . Heart attack Maternal Grandmother   . Diabetes Maternal Grandfather     Social History Social History   Tobacco Use  . Smoking status: Current Every Day Smoker    Packs/day: 1.50    Types: Cigarettes    Start date: 10/19/1989  . Smokeless tobacco: Never Used  Substance Use Topics  . Alcohol use: No  . Drug use: No    Review of Systems Constitutional: No  fever/chills Cardiovascular: Denies chest pain. Respiratory: Denies shortness of breath. Gastrointestinal: No abdominal pain.  No nausea, no vomiting.  Musculoskeletal: Has  left knee pain. Skin: Negative for rash. Neurological: Negative for headaches, focal weakness or numbness. Endocrine:  Positive type 2 diabetes.  ____________________________________________   PHYSICAL EXAM:  VITAL SIGNS: ED Triage Vitals  Enc Vitals Group     BP 12/02/19 0851 (!) 190/100     Pulse Rate 12/02/19 0851 75     Resp 12/02/19 0851 16     Temp 12/02/19 0851 98.1 F (36.7 C)     Temp Source 12/02/19 0851 Oral     SpO2 12/02/19 0851 98 %     Weight 12/02/19 0848 251 lb (113.9 kg)     Height 12/02/19 0848 5\' 7"  (1.702 m)     Head Circumference --      Peak Flow --      Pain Score 12/02/19 0848 10     Pain Loc --      Pain Edu? --      Excl. in [place]? --    Constitutional: Alert and oriented. Well appearing and in no acute distress. Eyes: Conjunctivae are normal.  Head: Atraumatic. Neck: No stridor.   Cardiovascular: Normal rate, regular rhythm. Grossly normal heart sounds.  Good peripheral circulation. Respiratory: Normal respiratory effort.  No retractions. Lungs CTAB. Musculoskeletal: On examination of the left knee there is no gross deformity however to palpate the skin is warm and mildly erythematous.  Moderate tenderness on palpation.  Motion is restricted secondary to pain.  No effusion is present.  Skin is intact.  Patient currently is using a cane to help with ambulation. Neurologic:  Normal speech and language. No gross focal neurologic deficits are appreciated. No gait instability. Skin:  Skin is warm, dry and intact.  Psychiatric: Mood and affect are normal. Speech and behavior are normal.  ____________________________________________   LABS (all labs ordered are listed, but only abnormal results are displayed)  Labs Reviewed  GLUCOSE, CAPILLARY - Abnormal; Notable for the  following components:      Result Value   Glucose-Capillary 138 (*)    All other components within normal limits  CBG MONITORING, ED     PROCEDURES  Procedure(s) performed (including Critical Care):  Procedures   ____________________________________________   INITIAL IMPRESSION / ASSESSMENT AND PLAN / ED COURSE  As part of my medical decision making, I reviewed the following data within the electronic MEDICAL RECORD NUMBER Notes from prior ED visits and [place] Controlled Substance Database  54 year old male presents to the ED with complaint of left knee pain which feels like his normal flareup of gout.  Patient takes allopurinol once a day but since his knee has been hurting began taking the indomethacin that was prescribed for him twice a day without any relief.  Patient denies any recent injury.  Physical exam is consistent  with gout with the skin being warm and tender to touch.  Patient is using a cane to help him ambulate.  Patient was not fasting but his blood sugar was 138.  Patient was given a prescription for prednisone for the next 5 days.  He is aware that he may see an elevation in his blood sugar and also to stick to his diabetic diet while taking the prednisone.  A prescription for tramadol was also sent to his pharmacy every 6 hours as needed for pain.  He is to follow-up with his PCP if any continued problems. ____________________________________________   FINAL CLINICAL IMPRESSION(S) / ED DIAGNOSES  Final diagnoses:  Acute gout of left knee, unspecified cause     ED Discharge Orders         Ordered    predniSONE (DELTASONE) 10 MG tablet     12/02/19 1010    traMADol (ULTRAM) 50 MG tablet  Every 6 hours PRN     12/02/19 1018           Note:  This document was prepared using Dragon voice recognition software and may include unintentional dictation errors.    Johnn Hai, PA-C 12/02/19 1047    Harvest Dark, MD 12/02/19 [phone]

## 2019-12-02 NOTE — ED Notes (Signed)
Pt states he has a hx of gout and takes daily medications to prevent it. Pt c/o left knee pain with swelling and redness that started yesterday, states the gout normally starts in the great toe and then it will go into his knee but this time is only having knee pain with swelling. States he is having a lot of difficulty with ambulation.

## 2019-12-18 DIAGNOSIS — E1129 Type 2 diabetes mellitus with other diabetic kidney complication: Secondary | ICD-10-CM | POA: Diagnosis not present

## 2019-12-18 DIAGNOSIS — I10 Essential (primary) hypertension: Secondary | ICD-10-CM | POA: Diagnosis not present

## 2019-12-18 DIAGNOSIS — R808 Other proteinuria: Secondary | ICD-10-CM | POA: Diagnosis not present

## 2019-12-26 ENCOUNTER — Ambulatory Visit: Payer: Medicaid Other | Attending: Internal Medicine

## 2019-12-26 ENCOUNTER — Other Ambulatory Visit: Payer: Self-pay

## 2019-12-26 DIAGNOSIS — Z23 Encounter for immunization: Secondary | ICD-10-CM

## 2019-12-26 NOTE — Progress Notes (Signed)
   Covid-19 Vaccination Clinic  Name:  Cory Rivas    MRN: [id number] DOB: 10-Oct-1966  12/26/2019  Cory Rivas was observed post Covid-19 immunization for 15 minutes without incident. He was provided with Vaccine Information Sheet and instruction to access the V-Safe system.   Cory Rivas was instructed to call 911 with any severe reactions post vaccine: Marland Kitchen Difficulty breathing  . Swelling of face and throat  . A fast heartbeat  . A bad rash all over body  . Dizziness and weakness   Immunizations Administered    Name Date Dose VIS Date Route   Pfizer COVID-19 Vaccine 12/26/2019 12:15 PM 0.3 mL 09/25/2019 Intramuscular   Manufacturer: [place]   Lot: [id number]   [place]: [id number]

## 2020-01-19 ENCOUNTER — Ambulatory Visit: Payer: Medicaid Other | Attending: Internal Medicine

## 2020-01-19 ENCOUNTER — Other Ambulatory Visit: Payer: Self-pay

## 2020-01-19 DIAGNOSIS — Z23 Encounter for immunization: Secondary | ICD-10-CM

## 2020-01-19 NOTE — Progress Notes (Signed)
   Covid-19 Vaccination Clinic  Name:  Cory Rivas    MRN: [id number] DOB: July 30, 1966  01/19/2020  [name] was observed post Covid-19 immunization for 15 minutes without incident. He was provided with Vaccine Information Sheet and instruction to access the V-Safe system.   [name] was instructed to call 911 with any severe reactions post vaccine: Marland Kitchen Difficulty breathing  . Swelling of face and throat  . A fast heartbeat  . A bad rash all over body  . Dizziness and weakness   Immunizations Administered    Name Date Dose VIS Date Route   Pfizer COVID-19 Vaccine 01/19/2020  1:27 PM 0.3 mL 09/25/2019 Intramuscular   Manufacturer: [place]   Lot: [id number]   [place]: [address]-6

## 2020-02-01 ENCOUNTER — Other Ambulatory Visit: Payer: Self-pay | Admitting: Family Medicine

## 2020-02-01 NOTE — Telephone Encounter (Signed)
Requested medication (s) are due for refill today: yes  Requested medication (s) are on the active medication list: yes  Last refill:  11/02/2019  Future visit scheduled: no  Notes to clinic:  medication was filled by historical provider Review for refill    Requested Prescriptions  Pending Prescriptions Disp Refills   indomethacin (INDOCIN) 50 MG capsule [Pharmacy Med Name: INDOMETHACIN 50 MG CAP] 30 capsule     Sig: TAKE 1 CAPSULE BY MOUTH 3 TIMES DAILY ASNEEDED (GOUT FLARE).      Analgesics:  NSAIDS Failed - 02/01/2020  8:15 AM      Failed - HGB in normal range and within 360 days    No results found for: HGB, HGBKUC, HGBPOCKUC, HGBOTHER, TOTHGB, HGBPLASMA        Passed - Cr in normal range and within 360 days    Creat  Date Value Ref Range Status  03/18/2019 1.22 0.70 - 1.33 mg/dL Final    Comment:    For patients >32 years of age, the reference limit for Creatinine is approximately 13% higher for people identified as African-American. .    Creatinine, Urine  Date Value Ref Range Status  03/23/2019 220 20 - 320 mg/dL Final          Passed - Patient is not pregnant      Passed - Valid encounter within last 12 months    Recent Outpatient Visits           3 months ago Type 2 diabetes mellitus with other specified complication, without long-term current use of insulin [facility])   [facility] Steele Sizer, MD   6 months ago Annual physical exam   [place], FNP   6 months ago Idiopathic chronic gout of multiple sites without tophus   [place], FNP   7 months ago Dental infection   [place], Astrid Divine, FNP   9 months ago Essential hypertension   [place], Astrid Divine, [place]

## 2020-02-04 NOTE — Telephone Encounter (Signed)
lvm to sch followup and meds have been sent to pharm

## 2020-03-01 ENCOUNTER — Other Ambulatory Visit: Payer: Self-pay

## 2020-03-01 ENCOUNTER — Encounter: Payer: Self-pay | Admitting: Family Medicine

## 2020-03-01 ENCOUNTER — Ambulatory Visit: Payer: Medicaid Other | Admitting: Family Medicine

## 2020-03-01 VITALS — BP 176/90 | HR 85 | Temp 98.1°F | Resp 14 | Ht 67.0 in | Wt 262.5 lb

## 2020-03-01 DIAGNOSIS — Z1211 Encounter for screening for malignant neoplasm of colon: Secondary | ICD-10-CM | POA: Diagnosis not present

## 2020-03-01 DIAGNOSIS — E785 Hyperlipidemia, unspecified: Secondary | ICD-10-CM | POA: Diagnosis not present

## 2020-03-01 DIAGNOSIS — Z5181 Encounter for therapeutic drug level monitoring: Secondary | ICD-10-CM

## 2020-03-01 DIAGNOSIS — M5136 Other intervertebral disc degeneration, lumbar region: Secondary | ICD-10-CM

## 2020-03-01 DIAGNOSIS — M1A09X0 Idiopathic chronic gout, multiple sites, without tophus (tophi): Secondary | ICD-10-CM | POA: Diagnosis not present

## 2020-03-01 DIAGNOSIS — N182 Chronic kidney disease, stage 2 (mild): Secondary | ICD-10-CM | POA: Diagnosis not present

## 2020-03-01 DIAGNOSIS — I10 Essential (primary) hypertension: Secondary | ICD-10-CM | POA: Diagnosis not present

## 2020-03-01 DIAGNOSIS — M4722 Other spondylosis with radiculopathy, cervical region: Secondary | ICD-10-CM | POA: Diagnosis not present

## 2020-03-01 DIAGNOSIS — M5412 Radiculopathy, cervical region: Secondary | ICD-10-CM | POA: Diagnosis not present

## 2020-03-01 DIAGNOSIS — E66813 Obesity, class 3: Secondary | ICD-10-CM

## 2020-03-01 DIAGNOSIS — Z6841 Body Mass Index (BMI) 40.0 and over, adult: Secondary | ICD-10-CM | POA: Diagnosis not present

## 2020-03-01 DIAGNOSIS — E1169 Type 2 diabetes mellitus with other specified complication: Secondary | ICD-10-CM

## 2020-03-01 DIAGNOSIS — M51369 Other intervertebral disc degeneration, lumbar region without mention of lumbar back pain or lower extremity pain: Secondary | ICD-10-CM

## 2020-03-01 MED ORDER — AMLODIPINE BESYLATE 10 MG PO TABS: 10.0000 mg | ORAL_TABLET | Freq: Every day | ORAL | 3 refills | Status: DC

## 2020-03-01 MED ORDER — ROSUVASTATIN CALCIUM 40 MG PO TABS: 40.0000 mg | ORAL_TABLET | Freq: Every day | ORAL | 3 refills | Status: DC

## 2020-03-01 MED ORDER — METFORMIN HCL 1000 MG PO TABS: 1000.0000 mg | ORAL_TABLET | Freq: Every day | ORAL | 3 refills | Status: DC

## 2020-03-01 MED ORDER — LISINOPRIL 40 MG PO TABS: 40.0000 mg | ORAL_TABLET | Freq: Every day | ORAL | 3 refills | Status: DC

## 2020-03-01 MED ORDER — PREGABALIN 25 MG PO CAPS: 25.0000 mg | ORAL_CAPSULE | Freq: Two times a day (BID) | ORAL | 1 refills | Status: DC

## 2020-03-01 MED ORDER — DULOXETINE HCL 30 MG PO CPEP: 30.0000 mg | ORAL_CAPSULE | Freq: Every day | ORAL | 3 refills | Status: DC

## 2020-03-01 NOTE — Patient Instructions (Addendum)
See if you can get and take cymbalta and lyrica - they both treat pain - chronic and acute muscle skeletal or neuropathic pain -   Avoid NSAIDs - no ibuprofen Advil aleve    Use tylenol [phone] up to 3 times a day  Copied from your nephrology visit:  Blood pressure monitoring education: Monitor home blood pressure values after sitting for 5 minutes with back and arm support. Keep a log. Bring your log and blood pressure cuff to your next visit.   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  * To manage this, you need excellent control of diabetes, blood pressure, good diet, regular exercise and avoidance of medications and supplements that can be toxic to the kidneys. * Your goal A1c is less than 7.0 * Your goal blood pressure is less than 130/80 * You should avoid taking anti-inflammatories when possible. * You should work out at least 30 minutes 3-4 times a week. * Avoid unnecessary supplements, many of which can be toxic to your kidneys. * Review patient friendly information on kidney.org   I will send in additional BP meds, start taking them, avoid salt and avoid NSAIDs (try other meds for neck pain) and keep track of BP with log below, and follow up here in 2-4 weeks  Blood Pressure Record Sheet  Blood pressure log Date: _______________________  a.m. _____________________(1st reading) _____________________(2nd reading)  p.m. _____________________(1st reading) _____________________(2nd reading) Date: _______________________  a.m. _____________________(1st reading) _____________________(2nd reading)  p.m. _____________________(1st reading) _____________________(2nd reading) Date: _______________________  a.m. _____________________(1st reading) _____________________(2nd reading)  p.m. _____________________(1st reading) _____________________(2nd reading) Date: _______________________  a.m. _____________________(1st reading)  _____________________(2nd reading)  p.m. _____________________(1st reading) _____________________(2nd reading) Date: _______________________  a.m. _____________________(1st reading) _____________________(2nd reading)  p.m. _____________________(1st reading) _____________________(2nd reading) Date: _______________________  a.m. _____________________(1st reading) _____________________(2nd reading)  p.m. _____________________(1st reading) _____________________(2nd reading) Date: _______________________  a.m. _____________________(1st reading) _____________________(2nd reading)  p.m. _____________________(1st reading) _____________________(2nd reading) Date: _______________________  a.m. _____________________(1st reading) _____________________(2nd reading)  p.m. _____________________(1st reading) _____________________(2nd reading) Date: _______________________  a.m. _____________________(1st reading) _____________________(2nd reading)  p.m. _____________________(1st reading) _____________________(2nd reading) Date: _______________________  a.m. _____________________(1st reading) _____________________(2nd reading)  p.m. _____________________(1st reading) _____________________(2nd reading)    How to Take Your Blood Pressure You can take your blood pressure at home with a machine. You may need to check your blood pressure at home:  To check if you have high blood pressure (hypertension).  To check your blood pressure over time.  To make sure your blood pressure medicine is working. Supplies needed: You will need a blood pressure machine, or monitor. You can buy one at a drugstore or online. When choosing one:  Choose one with an arm cuff.  Choose one that wraps around your upper arm. Only one finger should fit between your arm and the cuff.  Do not choose one that measures your blood pressure from your wrist or finger. Your doctor can suggest a monitor. How to  prepare Avoid these things for 30 minutes before checking your blood pressure:  Drinking caffeine.  Drinking alcohol.  Eating.  Smoking.  Exercising. Five minutes before checking your blood pressure:  Pee.  Sit in a dining chair. Avoid sitting in a soft couch or armchair.  Be quiet. Do not talk. How to take your blood pressure Follow the instructions that came with your machine. If you have a digital blood pressure monitor, these may be the instructions: 1. Sit up straight. 2. Place your feet on the floor.  Do not cross your ankles or legs. 3. Rest your left arm at the level of your heart. You may rest it on a table, desk, or chair. 4. Pull up your shirt sleeve. 5. Wrap the blood pressure cuff around the upper part of your left arm. The cuff should be 1 inch (2.5 cm) above your elbow. It is best to wrap the cuff around bare skin. 6. Fit the cuff snugly around your arm. You should be able to place only one finger between the cuff and your arm. 7. Put the cord inside the groove of your elbow. 8. Press the power button. 9. Sit quietly while the cuff fills with air and loses air. 10. Write down the numbers on the screen. 11. Wait 2-3 minutes and then repeat steps 1-10. What do the numbers mean? Two numbers make up your blood pressure. The first number is called systolic pressure. The second is called diastolic pressure. An example of a blood pressure reading is "120 over 80" (or 120/80). If you are an adult and do not have a medical condition, use this guide to find out if your blood pressure is normal: Normal  First number: below 120.  Second number: below 80. Elevated  First number: 120-129.  Second number: below 80. Hypertension stage 1  First number: 130-139.  Second number: 80-89. Hypertension stage 2  First number: 140 or above.  Second number: 59 or above. Your blood pressure is above normal even if only the top or bottom number is above normal. Follow these  instructions at home:  Check your blood pressure as often as your doctor tells you to.  Take your monitor to your next doctor's appointment. Your doctor will: ? Make sure you are using it correctly. ? Make sure it is working right.  Make sure you understand what your blood pressure numbers should be.  Tell your doctor if your medicines are causing side effects. Contact a doctor if:  Your blood pressure keeps being high. Get help right away if:  Your first blood pressure number is higher than 180.  Your second blood pressure number is higher than 120. This information is not intended to replace advice given to you by your health care provider. Make sure you discuss any questions you have with your health care provider. Document Revised: 09/13/2017 Document Reviewed: 03/09/2016 Elsevier Patient Education  [place] DASH stands for "Dietary Approaches to Stop Hypertension." The DASH eating plan is a healthy eating plan that has been shown to reduce high blood pressure (hypertension). It may also reduce your risk for type 2 diabetes, heart disease, and stroke. The DASH eating plan may also help with weight loss. What are tips for following this plan?  General guidelines  Avoid eating more than 2,300 mg (milligrams) of salt (sodium) a day. If you have hypertension, you may need to reduce your sodium intake to 1,500 mg a day.  Limit alcohol intake to no more than 1 drink a day for nonpregnant women and 2 drinks a day for men. One drink equals 12 oz of beer, 5 oz of wine, or 1 oz of hard liquor.  Work with your health care provider to maintain a healthy body weight or to lose weight. Ask what an ideal weight is for you.  Get at least 30 minutes of exercise that causes your heart to beat faster (aerobic exercise) most days of the week. Activities may include walking, swimming, or biking.  Work  with your health care provider or diet and nutrition specialist  (dietitian) to adjust your eating plan to your individual calorie needs. Reading food labels   Check food labels for the amount of sodium per serving. Choose foods with less than 5 percent of the Daily Value of sodium. Generally, foods with less than 300 mg of sodium per serving fit into this eating plan.  To find whole grains, look for the word "whole" as the first word in the ingredient list. Shopping  Buy products labeled as "low-sodium" or "no salt added."  Buy fresh foods. Avoid canned foods and premade or frozen meals. Cooking  Avoid adding salt when cooking. Use salt-free seasonings or herbs instead of table salt or sea salt. Check with your health care provider or pharmacist before using salt substitutes.  Do not fry foods. Cook foods using healthy methods such as baking, boiling, grilling, and broiling instead.  Cook with heart-healthy oils, such as olive, canola, soybean, or sunflower oil. Meal planning  Eat a balanced diet that includes: ? 5 or more servings of fruits and vegetables each day. At each meal, try to fill half of your plate with fruits and vegetables. ? Up to 6-8 servings of whole grains each day. ? Less than 6 oz of lean meat, poultry, or fish each day. A 3-oz serving of meat is about the same size as a deck of cards. One egg equals 1 oz. ? 2 servings of low-fat dairy each day. ? A serving of nuts, seeds, or beans 5 times each week. ? Heart-healthy fats. Healthy fats called Omega-3 fatty acids are found in foods such as flaxseeds and coldwater fish, like sardines, salmon, and mackerel.  Limit how much you eat of the following: ? Canned or prepackaged foods. ? Food that is high in trans fat, such as fried foods. ? Food that is high in saturated fat, such as fatty meat. ? Sweets, desserts, sugary drinks, and other foods with added sugar. ? Full-fat dairy products.  Do not salt foods before eating.  Try to eat at least 2 vegetarian meals each week.  Eat  more home-cooked food and less restaurant, buffet, and fast food.  When eating at a restaurant, ask that your food be prepared with less salt or no salt, if possible. What foods are recommended? The items listed may not be a complete list. Talk with your dietitian about what dietary choices are best for you. Grains Whole-grain or whole-wheat bread. Whole-grain or whole-wheat pasta. Brown rice. [place]. Bulgur. Whole-grain and low-sodium cereals. Pita bread. Low-fat, low-sodium crackers. Whole-wheat flour tortillas. Vegetables Fresh or frozen vegetables (raw, steamed, roasted, or grilled). Low-sodium or reduced-sodium tomato and vegetable juice. Low-sodium or reduced-sodium tomato sauce and tomato paste. Low-sodium or reduced-sodium canned vegetables. Fruits All fresh, dried, or frozen fruit. Canned fruit in natural juice (without added sugar). Meat and other protein foods Skinless chicken or [place]. Ground chicken or [place]. Pork with fat trimmed off. Fish and seafood. Egg whites. Dried beans, peas, or lentils. Unsalted nuts, nut butters, and seeds. Unsalted canned beans. Lean cuts of beef with fat trimmed off. Low-sodium, lean deli meat. Dairy Low-fat (1%) or fat-free (skim) milk. Fat-free, low-fat, or reduced-fat cheeses. Nonfat, low-sodium ricotta or cottage cheese. Low-fat or nonfat yogurt. Low-fat, low-sodium cheese. Fats and oils Soft margarine without trans fats. Vegetable oil. Low-fat, reduced-fat, or light mayonnaise and salad dressings (reduced-sodium). Canola, safflower, olive, soybean, and sunflower oils. Avocado. Seasoning and other foods Herbs. Spices. Seasoning mixes without  salt. Unsalted popcorn and pretzels. Fat-free sweets. What foods are not recommended? The items listed may not be a complete list. Talk with your dietitian about what dietary choices are best for you. Grains Baked goods made with fat, such as croissants, muffins, or some breads. Dry pasta or rice meal  packs. Vegetables Creamed or fried vegetables. Vegetables in a cheese sauce. Regular canned vegetables (not low-sodium or reduced-sodium). Regular canned tomato sauce and paste (not low-sodium or reduced-sodium). Regular tomato and vegetable juice (not low-sodium or reduced-sodium). Angie Fava. Olives. Fruits Canned fruit in a light or heavy syrup. Fried fruit. Fruit in cream or butter sauce. Meat and other protein foods Fatty cuts of meat. Ribs. Fried meat. Berniece Salines. Sausage. Bologna and other processed lunch meats. Salami. Fatback. Hotdogs. Bratwurst. Salted nuts and seeds. Canned beans with added salt. Canned or smoked fish. Whole eggs or egg yolks. Chicken or [place] with skin. Dairy Whole or 2% milk, cream, and half-and-half. Whole or full-fat cream cheese. Whole-fat or sweetened yogurt. Full-fat cheese. Nondairy creamers. Whipped toppings. Processed cheese and cheese spreads. Fats and oils Butter. Stick margarine. Lard. Shortening. Ghee. Bacon fat. Tropical oils, such as coconut, palm kernel, or palm oil. Seasoning and other foods Salted popcorn and pretzels. Onion salt, garlic salt, seasoned salt, table salt, and sea salt. Worcestershire sauce. Tartar sauce. Barbecue sauce. Teriyaki sauce. Soy sauce, including reduced-sodium. Steak sauce. Canned and packaged gravies. Fish sauce. Oyster sauce. Cocktail sauce. Horseradish that you find on the shelf. Ketchup. Mustard. Meat flavorings and tenderizers. Bouillon cubes. Hot sauce and Tabasco sauce. Premade or packaged marinades. Premade or packaged taco seasonings. Relishes. Regular salad dressings. Where to find more information:  National Heart, Lung, and [place]: https://wilson-eaton.com/  American Heart Association: www.heart.org Summary  The DASH eating plan is a healthy eating plan that has been shown to reduce high blood pressure (hypertension). It may also reduce your risk for type 2 diabetes, heart disease, and stroke.  With the DASH eating  plan, you should limit salt (sodium) intake to 2,300 mg a day. If you have hypertension, you may need to reduce your sodium intake to 1,500 mg a day.  When on the DASH eating plan, aim to eat more fresh fruits and vegetables, whole grains, lean proteins, low-fat dairy, and heart-healthy fats.  Work with your health care provider or diet and nutrition specialist (dietitian) to adjust your eating plan to your individual calorie needs. This information is not intended to replace advice given to you by your health care provider. Make sure you discuss any questions you have with your health care provider. Document Revised: 09/13/2017 Document Reviewed: 09/24/2016 Elsevier Patient Education  2020 [employer].

## 2020-03-01 NOTE — Progress Notes (Signed)
Name: Cory Rivas   MRN: [id number]    DOB: 1965-12-28   Date:03/01/2020       Progress Note  Chief Complaint  Patient presents with  . Follow-up  . Diabetes  . Hypertension  . Hyperlipidemia     Subjective:   [name] is a 54 y.o. male, presents to clinic for routine follow up on the conditions listed above.  Pt is new to me, PCP left the practice and he was recently seen by other providers in same office (Dr. Sanda Rivas old PCP, then became Cory Ensign NP, has been seen by Cory Rivas and Dr. Ancil Rivas over the past year - new to me)  Pt here with elevated blood pressure - 176/90 He is on lisinopril 20 mg, BP has been high for at least the last couple months. He does report a history of whitecoat hypertension, notes that he gets very anxious to be here.  His blood pressure was repeated multiple times prior to me seeing him today.  He does not know of any changes or events of the last several months he has any symptoms which seem associated with increasing blood pressure.  He has been on the same blood pressure medication for quite some time, we did review his history of hypertension medications and it does seem that he initially was started on much lower dose of lisinopril and over years has increased from 5-10 to now 20 mg dose.  BP gradually worsening over the past year Pt reports good med compliance and denies any SE.  Blood pressure today is uncontrolled. BP Readings from Last 3 Encounters:  03/01/20 (!) 176/90  12/02/19 (!) 170/99  08/01/19 (!) 142/72   Pt denies CP, SOB, exertional sx, LE edema, palpitation, Ha's, visual disturbances, lightheadedness, hypotension, syncope.  Sometimes SOB with laying flat  Dietary efforts for BP?  Off diet - eating more  Bad foods Pt sees Nephrology for proteinuria -   HLD- crestor 40 mg, patient is compliant with his medications, he denies any myalgias Lab Results  Component Value Date   CHOL 202 (H) 06/18/2019   HDL 37 (L) 06/18/2019   LDLCALC 127 (H) 06/18/2019   TRIG 250 (H) 06/18/2019   CHOLHDL 5.5 (H) 06/18/2019   Gout on 100 mg alopurinol -patient reports frequent flares that are severe and painful difficult to walk and difficult to work with.  He also has indomethacin for flares.  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  He was given narcotic pain medicine which he does not like to take.  He has had associated neck pain causing headaches, eye symptoms and also some radiation of pain and numbness down both of his arms.  He had been given a steroid taper in the past which helped a little bit. He reports the chronic pain is made his sleeping much worse and he is having a difficult time sleeping and feeling well rested, he is currently in moderate to severe amount of pain.  He had tried Robaxin and Toradol in the past     Patient Active Problem List   Diagnosis Date Noted  . Class 3 severe obesity due to excess calories with serious comorbidity and body mass index (BMI)  of 40.0 to 44.9 in adult [facility]) 04/15/2019  . Stress due to family tension 04/15/2019  . Chronic kidney disease (CKD) stage G2/A3, mildly decreased glomerular filtration rate (GFR) between 60-89 mL/min/1.73 square meter and albuminuria creatinine ratio greater than 300 mg/g 04/15/2019  . Albuminuria 04/15/2019  . Hyperlipidemia associated with type 2 diabetes mellitus ([place]) 03/18/2019  . Diabetes mellitus ([place]) 03/18/2019  . Idiopathic chronic gout of multiple sites without tophus 03/18/2019  . Essential hypertension 03/18/2019  . Dermatitis 03/18/2019  . Lumbar degenerative disc disease 11/22/2015  . Osteoarthritis of spine with radiculopathy, cervical region 11/22/2015  . Osteoarthritis of spine with radiculopathy, lumbar  region 11/22/2015    Past Surgical History:  Procedure Laterality Date  . CHOLECYSTECTOMY      Family History  Problem Relation Age of Onset  . Heart disease Mother   . Diabetes Mother   . Hyperlipidemia Mother   . Hypertension Mother   . Lung cancer Father   . Hypertension Father   . Stroke Father   . AAA (abdominal aortic aneurysm) Maternal Grandmother   . Heart attack Maternal Grandmother   . Diabetes Maternal Grandfather     Social History   Tobacco Use  . Smoking status: Current Every Day Smoker    Packs/day: 1.50    Types: Cigarettes    Start date: 10/19/1989  . Smokeless tobacco: Never Used  Substance Use Topics  . Alcohol use: No  . Drug use: No      Current Outpatient Medications:  .  allopurinol (ZYLOPRIM) 100 MG tablet, Take 1 tablet (100 mg total) by mouth daily., Disp: 90 tablet, Rfl: 1 .  ibuprofen (ADVIL) 200 MG tablet, Take 200-400 mg by mouth daily., Disp: , Rfl:  .  indomethacin (INDOCIN) 50 MG capsule, TAKE 1 CAPSULE BY MOUTH 3 TIMES DAILY ASNEEDED (GOUT FLARE)., Disp: 30 capsule, Rfl: 0 .  lisinopril (ZESTRIL) 20 MG tablet, Take 1 tablet (20 mg total) by mouth daily., Disp: 90 tablet, Rfl: 0 .  metFORMIN (GLUCOPHAGE) 1000 MG tablet, TAKE 1 TABLET BY MOUTH ONCE DAILY, Disp: 90 tablet, Rfl: 2 .  rosuvastatin (CRESTOR) 40 MG tablet, Take 1 tablet (40 mg total) by mouth daily., Disp: 90 tablet, Rfl: 1 .  triamcinolone cream (KENALOG) 0.1 %, Apply 1 application topically 2 (two) times daily., Disp: 30 g, Rfl: 0  No Known Allergies  Chart Review Today: I personally reviewed active problem list, medication list, allergies, family history, social history, health maintenance, notes from last encounter, lab results, imaging with the patient/caregiver today.   Review of Systems  10 Systems reviewed and are negative for acute change except as noted in the HPI.  Objective:    Vitals:   03/01/20 1511 03/01/20 1529  BP: (!) 178/88 (!) 176/90  Pulse: 85    Resp: 14   Temp: 98.1 F (36.7 C)   SpO2: 97%   Weight: 262 lb 8 oz (119.1 kg)   Height: 5\' 7"  (1.702 m)     Body mass index is 41.11 kg/m.  Physical Exam Vitals and nursing note reviewed.  Constitutional:      Appearance: He is well-developed. He is morbidly obese. He is not ill-appearing, toxic-appearing or diaphoretic.     Comments: Very uncomfortable appearing male, no acute distress  HENT:     Head: Normocephalic and atraumatic.  Neck:     Comments: Decrease cervical spine range of motion 5/5 bilateral grip strength in upper extremities Cardiovascular:     Rate and Rhythm:  Normal rate and regular rhythm.     Pulses: Normal pulses.          Radial pulses are 2+ on the right side and 2+ on the left side.     Heart sounds: Heart sounds are distant.     Comments: Nonpitting bilateral lower extremity edema, appears symmetrical  Heart sounds difficult to auscultate with body habitus Pulmonary:     Effort: Pulmonary effort is normal. No respiratory distress.     Breath sounds: Normal breath sounds. No stridor. No wheezing.  Abdominal:     General: Bowel sounds are normal. There is no distension.     Palpations: Abdomen is soft.  Musculoskeletal:     Right lower leg: Edema present.     Left lower leg: Edema present.  Skin:    Coloration: Skin is not jaundiced or pale.  Neurological:     Mental Status: He is alert.  Psychiatric:        Mood and Affect: Mood normal.        Behavior: Behavior normal. Behavior is cooperative.        PHQ2/9: Depression screen [facility] 2/9 03/01/2020 10/20/2019 07/30/2019 07/23/2019 06/18/2019  Decreased Interest 0 0 0 0 1  Down, Depressed, Hopeless 0 1 0 0 1  PHQ - 2 Score 0 1 0 0 2  Altered sleeping 0 2 0 0 0  Tired, decreased energy 0 0 0 0 0  Change in appetite 0 0 0 0 0  Feeling bad or failure about yourself  0 0 0 0 0  Trouble concentrating 0 0 0 0 0  Moving slowly or fidgety/restless 0 0 0 0 0  Suicidal thoughts 0 0 0 0 0  PHQ-9  Score 0 3 0 0 2  Difficult doing work/chores Not difficult at all Not difficult at all Not difficult at all Not difficult at all Not difficult at all    phq 9 is neg reviewed  Fall Risk: Fall Risk  03/01/2020 10/20/2019 07/30/2019 07/23/2019 06/18/2019  Falls in the past year? 1 1 0 1 0  Number falls in past yr: 0 0 0 1 0  Injury with Fall? 0 1 0 1 0  Comment - hurt his back and elbow - - -  Follow up - - Falls evaluation completed Falls evaluation completed Falls evaluation completed    Functional Status Survey: Is the patient deaf or have difficulty hearing?: No Does the patient have difficulty seeing, even when wearing glasses/contacts?: No Does the patient have difficulty concentrating, remembering, or making decisions?: No Does the patient have difficulty walking or climbing stairs?: No Does the patient have difficulty dressing or bathing?: No Does the patient have difficulty doing errands alone such as visiting a doctor's office or shopping?: No   Assessment & Plan:   1. Type 2 diabetes mellitus with other specified complication, without long-term current use of insulin (HCC) History of well-controlled diabetes, will recheck labs today, up-to-date on foot exam and due for eye exam.,  He is compliant with his medications, taking Metformin without any GI side effects or concerns - Ambulatory referral to Ophthalmology - COMPLETE METABOLIC PANEL WITH GFR - Hemoglobin A1c - rosuvastatin (CRESTOR) 40 MG tablet; Take 1 tablet (40 mg total) by mouth daily.  Dispense: 90 tablet; Refill: 3 - metFORMIN (GLUCOPHAGE) 1000 MG tablet; Take 1 tablet (1,000 mg total) by mouth daily.  Dispense: 90 tablet; Refill: 3  2. Screen for colon cancer - Ambulatory referral to Gastroenterology  3.  Essential hypertension Blood pressure uncontrolled for the past several months, he does have history of whitecoat hypertension and he is very uncomfortable today is obviously in pain, do feel that his blood  pressure still will need to be improved even with anxiety and pain likely elevating blood pressure above his norm. Discussed increasing his medications will stop lisinopril 20 mg and start lisinopril 40 mg.  Can add amlodipine 10 mg if blood pressure is still elevated over 150/90, patient to monitor and we will do a close follow-up. He is currently having headaches and neck pain from a recent injury, do not suspect headaches are related to his blood pressure.  He denies any chest pain or pressure, he does have some fullness and possible edema to his lower extremities but he has no pitting edema and has no other signs or symptoms concerning for endorgan damage. Encouraged him to work on [employer], healthy lifestyle, we will try to help manage his pain with plan below Close follow-up - COMPLETE METABOLIC PANEL WITH GFR - amLODipine (NORVASC) 10 MG tablet; Take 1 tablet (10 mg total) by mouth daily.  Dispense: 90 tablet; Refill: 3 - lisinopril (ZESTRIL) 40 MG tablet; Take 1 tablet (40 mg total) by mouth daily.  Dispense: 90 tablet; Refill: 3  4. Hyperlipidemia associated with type 2 diabetes mellitus ([place]) Compliant with statin per patient report, last lipid panel was not well controlled or at goal. He admits that recently his diet has been very unhealthy, encouraged to work on healthier food choices and reducing portions and calories. - COMPLETE METABOLIC PANEL WITH GFR - Lipid panel - rosuvastatin (CRESTOR) 40 MG tablet; Take 1 tablet (40 mg total) by mouth daily.  Dispense: 90 tablet; Refill: 3  5. Osteoarthritis of spine with radiculopathy, cervical region  6. Lumbar degenerative disc disease - DULoxetine (CYMBALTA) 30 MG capsule; Take 1 capsule (30 mg total) by mouth daily.  Dispense: 90 capsule; Refill: 3 - pregabalin (LYRICA) 25 MG capsule; Take 1 capsule (25 mg total) by mouth 2 (two) times daily.  Dispense: 180 capsule; Refill: 1  7. Idiopathic chronic gout of multiple sites without  tophus Patient on 100 mg allopurinol, recently recent diet, will recheck uric acid levels since he reports frequent gout flares.  Discussed increasing his allopurinol medication if needed to get his uric acid at goal of less than 6.0.  He also has indomethacin to take as needed for flares reviewed this medication and interactions with statins-he was previously instructed and is aware of the interaction and does hold his statin medication when eating indomethacin for gout flares. - COMPLETE METABOLIC PANEL WITH GFR - Uric acid  8. Class 3 severe obesity due to excess calories with serious comorbidity and body mass index (BMI) of 40.0 to 44.9 in adult [facility]) Encourage patient to work on healthier diet and increasing physical activity when able to tolerate (with recent injury/neck pain) - COMPLETE METABOLIC PANEL WITH GFR - Lipid panel  9. Chronic kidney disease (CKD) stage G2/A3, mildly decreased glomerular filtration rate (GFR) between 60-89 mL/min/1.73 square meter and albuminuria creatinine ratio greater than 300 mg/g - COMPLETE METABOLIC PANEL WITH GFR  10. Encounter for medication monitoring - CBC with Differential/Platelet - COMPLETE METABOLIC PANEL WITH GFR - Lipid panel - Hemoglobin A1c - Uric acid  11. Radiculopathy, cervical Patient has had a neck injury, previously evaluated and managed by specialist.  He reports no improvement with steroids, muscle relaxers, Toradol daily.  He continues to have radicular symptoms, explained  that trying these medications may help manage his chronic pain while he is still working with a specialist or seeking out second opinions. - DULoxetine (CYMBALTA) 30 MG capsule; Take 1 capsule (30 mg total) by mouth daily.  Dispense: 90 capsule; Refill: 3 - pregabalin (LYRICA) 25 MG capsule; Take 1 capsule (25 mg total) by mouth 2 (two) times daily.  Dispense: 180 capsule; Refill: 1    Return for 2-4 week f/up appt for HTN/med check,   4 month routine f/up for  chronic conditions HLD gout HTN DM.   Delsa Grana, PA-C 03/01/20 3:35 PM

## 2020-03-02 ENCOUNTER — Other Ambulatory Visit: Payer: Self-pay | Admitting: Family Medicine

## 2020-03-02 DIAGNOSIS — M1A09X0 Idiopathic chronic gout, multiple sites, without tophus (tophi): Secondary | ICD-10-CM

## 2020-03-02 LAB — COMPLETE METABOLIC PANEL WITH GFR
AG Ratio: 1.4 (calc) (ref 1.0–2.5)
ALT: 23 U/L (ref 9–46)
AST: 19 U/L (ref 10–35)
Albumin: 3.8 g/dL (ref 3.6–5.1)
Alkaline phosphatase (APISO): 64 U/L (ref 35–144)
BUN: 10 mg/dL (ref 7–25)
CO2: 29 mmol/L (ref 20–32)
Calcium: 9 mg/dL (ref 8.6–10.3)
Chloride: 99 mmol/L (ref 98–110)
Creat: 1.13 mg/dL (ref 0.70–1.33)
GFR, Est African American: 86 mL/min/{1.73_m2} (ref >=60)
GFR, Est Non African American: 74 mL/min/{1.73_m2} (ref >=60)
Globulin: 2.8 g/dL (calc) (ref 1.9–3.7)
Glucose, Bld: 194 mg/dL — ABNORMAL HIGH (ref 65–99)
Potassium: 4.3 mmol/L (ref 3.5–5.3)
Sodium: 136 mmol/L (ref 135–146)
Total Bilirubin: 0.5 mg/dL (ref 0.2–1.2)
Total Protein: 6.6 g/dL (ref 6.1–8.1)

## 2020-03-02 LAB — HEMOGLOBIN A1C
Hgb A1c MFr Bld: 6.8 % of total Hgb — ABNORMAL HIGH (ref <5.7)
Mean Plasma Glucose: 148 (calc)
eAG (mmol/L): 8.2 (calc)

## 2020-03-02 LAB — LIPID PANEL
Cholesterol: 205 mg/dL — ABNORMAL HIGH (ref <200)
HDL: 39 mg/dL — ABNORMAL LOW (ref >=40)
LDL Cholesterol (Calc): 131 mg/dL (calc) — ABNORMAL HIGH
Non-HDL Cholesterol (Calc): 166 mg/dL (calc) — ABNORMAL HIGH (ref <130)
Total CHOL/HDL Ratio: 5.3 (calc) — ABNORMAL HIGH (ref <5.0)
Triglycerides: 213 mg/dL — ABNORMAL HIGH (ref <150)

## 2020-03-02 LAB — CBC WITH DIFFERENTIAL/PLATELET
Absolute Monocytes: 606 cells/uL (ref 200–950)
Basophils Absolute: 42 cells/uL (ref 0–200)
Basophils Relative: 0.5 %
Eosinophils Absolute: 241 cells/uL (ref 15–500)
Eosinophils Relative: 2.9 %
HCT: 45.1 % (ref 38.5–50.0)
Hemoglobin: 15.1 g/dL (ref 13.2–17.1)
Lymphs Abs: 2507 cells/uL (ref 850–3900)
MCH: 28.9 pg (ref 27.0–33.0)
MCHC: 33.5 g/dL (ref 32.0–36.0)
MCV: 86.2 fL (ref 80.0–100.0)
MPV: 10.6 fL (ref 7.5–12.5)
Monocytes Relative: 7.3 %
Neutro Abs: 4905 cells/uL (ref 1500–7800)
Neutrophils Relative %: 59.1 %
Platelets: 222 10*3/uL (ref 140–400)
RBC: 5.23 10*6/uL (ref 4.20–5.80)
RDW: 13.1 % (ref 11.0–15.0)
Total Lymphocyte: 30.2 %
WBC: 8.3 10*3/uL (ref 3.8–10.8)

## 2020-03-02 LAB — URIC ACID: Uric Acid, Serum: 7.5 mg/dL (ref 4.0–8.0)

## 2020-03-02 MED ORDER — ALLOPURINOL 100 MG PO TABS: 200.0000 mg | ORAL_TABLET | Freq: Every day | ORAL | 3 refills | Status: DC

## 2020-03-03 ENCOUNTER — Telehealth (INDEPENDENT_AMBULATORY_CARE_PROVIDER_SITE_OTHER): Payer: Self-pay | Admitting: Gastroenterology

## 2020-03-03 ENCOUNTER — Other Ambulatory Visit: Payer: Self-pay

## 2020-03-03 DIAGNOSIS — Z1211 Encounter for screening for malignant neoplasm of colon: Secondary | ICD-10-CM

## 2020-03-03 MED ORDER — NA SULFATE-K SULFATE-MG SULF 17.5-3.13-1.6 GM/177ML PO SOLN: 1.0000 | Freq: Once | ORAL | 0 refills | Status: AC

## 2020-03-03 NOTE — Progress Notes (Signed)
Gastroenterology Pre-Procedure Review  Request Date: 03/10/20 Requesting Physician: Dr. Allen Norris  PATIENT REVIEW QUESTIONS: The patient responded to the following health history questions as indicated:    1. Are you having any GI issues? no 2. Do you have a personal history of Polyps? no 3. Do you have a family history of Colon Cancer or Polyps? no 4. Diabetes Mellitus? no 5. Joint replacements in the past 12 months?no 6. Major health problems in the past 3 months?no 12/02/19 Gout Left Knee 7. Any artificial heart valves, MVP, or defibrillator?no    MEDICATIONS & ALLERGIES:    Patient reports the following regarding taking any anticoagulation/antiplatelet therapy:   Plavix, Coumadin, Eliquis, Xarelto, Lovenox, Pradaxa, Brilinta, or Effient? no Aspirin? no  Patient confirms/reports the following medications:  Current Outpatient Medications  Medication Sig Dispense Refill  . allopurinol (ZYLOPRIM) 100 MG tablet Take 2 tablets (200 mg total) by mouth daily. 180 tablet 3  . amLODipine (NORVASC) 10 MG tablet Take 1 tablet (10 mg total) by mouth daily. 90 tablet 3  . DULoxetine (CYMBALTA) 30 MG capsule Take 1 capsule (30 mg total) by mouth daily. 90 capsule 3  . indomethacin (INDOCIN) 50 MG capsule TAKE 1 CAPSULE BY MOUTH 3 TIMES DAILY ASNEEDED (GOUT FLARE). 30 capsule 0  . lisinopril (ZESTRIL) 40 MG tablet Take 1 tablet (40 mg total) by mouth daily. 90 tablet 3  . metFORMIN (GLUCOPHAGE) 1000 MG tablet Take 1 tablet (1,000 mg total) by mouth daily. 90 tablet 3  . pregabalin (LYRICA) 25 MG capsule Take 1 capsule (25 mg total) by mouth 2 (two) times daily. 180 capsule 1  . rosuvastatin (CRESTOR) 40 MG tablet Take 1 tablet (40 mg total) by mouth daily. 90 tablet 3  . triamcinolone cream (KENALOG) 0.1 % Apply 1 application topically 2 (two) times daily. 30 g 0  . Na Sulfate-K Sulfate-Mg Sulf 17.5-3.13-1.6 GM/177ML SOLN Take 1 kit by mouth once for 1 dose. 354 mL 0   No current  facility-administered medications for this visit.    Patient confirms/reports the following allergies:  No Known Allergies  Orders Placed This Encounter  Procedures  . Procedural/ Surgical Case Request: COLONOSCOPY WITH PROPOFOL    Standing Status:   Standing    Number of Occurrences:   1    Order Specific Question:   Pre-op diagnosis    Answer:   screening colonoscopy    Order Specific Question:   CPT Code    Answer:   37445    AUTHORIZATION INFORMATION Primary Insurance: 1D#: Group #:  Secondary Insurance: 1D#: Group #:  SCHEDULE INFORMATION: Date: 03/10/20 Time: Location:[facility]

## 2020-03-08 ENCOUNTER — Other Ambulatory Visit
Admission: RE | Admit: 2020-03-08 | Discharge: 2020-03-08 | Disposition: A | Discharge status: Home / Self Care | Payer: Medicaid Other | Source: Ambulatory Visit | Attending: Gastroenterology | Admitting: Gastroenterology

## 2020-03-08 ENCOUNTER — Other Ambulatory Visit: Payer: Self-pay

## 2020-03-08 DIAGNOSIS — Z20822 Contact with and (suspected) exposure to covid-19: Secondary | ICD-10-CM | POA: Insufficient documentation

## 2020-03-08 DIAGNOSIS — Z01812 Encounter for preprocedural laboratory examination: Secondary | ICD-10-CM | POA: Insufficient documentation

## 2020-03-08 LAB — SARS CORONAVIRUS 2 (TAT 6-24 HRS): SARS Coronavirus 2: NEGATIVE

## 2020-03-10 ENCOUNTER — Ambulatory Visit: Payer: Medicaid Other | Admitting: Certified Registered"

## 2020-03-10 ENCOUNTER — Ambulatory Visit
Admission: RE | Admit: 2020-03-10 | Discharge: 2020-03-10 | Disposition: A | Discharge status: Home / Self Care | Payer: Medicaid Other | Attending: Gastroenterology | Admitting: Gastroenterology

## 2020-03-10 ENCOUNTER — Other Ambulatory Visit: Payer: Self-pay

## 2020-03-10 ENCOUNTER — Encounter: Payer: Self-pay | Admitting: Gastroenterology

## 2020-03-10 ENCOUNTER — Encounter
Admission: RE | Disposition: A | Discharge status: Home / Self Care | Payer: Self-pay | Source: Home / Self Care | Attending: Gastroenterology

## 2020-03-10 DIAGNOSIS — Z833 Family history of diabetes mellitus: Secondary | ICD-10-CM | POA: Insufficient documentation

## 2020-03-10 DIAGNOSIS — M5020 Other cervical disc displacement, unspecified cervical region: Secondary | ICD-10-CM | POA: Insufficient documentation

## 2020-03-10 DIAGNOSIS — E119 Type 2 diabetes mellitus without complications: Secondary | ICD-10-CM | POA: Insufficient documentation

## 2020-03-10 DIAGNOSIS — K621 Rectal polyp: Secondary | ICD-10-CM

## 2020-03-10 DIAGNOSIS — F17200 Nicotine dependence, unspecified, uncomplicated: Secondary | ICD-10-CM | POA: Diagnosis not present

## 2020-03-10 DIAGNOSIS — K635 Polyp of colon: Secondary | ICD-10-CM | POA: Diagnosis not present

## 2020-03-10 DIAGNOSIS — Z8249 Family history of ischemic heart disease and other diseases of the circulatory system: Secondary | ICD-10-CM | POA: Insufficient documentation

## 2020-03-10 DIAGNOSIS — F17210 Nicotine dependence, cigarettes, uncomplicated: Secondary | ICD-10-CM | POA: Insufficient documentation

## 2020-03-10 DIAGNOSIS — D122 Benign neoplasm of ascending colon: Secondary | ICD-10-CM | POA: Diagnosis not present

## 2020-03-10 DIAGNOSIS — Z8349 Family history of other endocrine, nutritional and metabolic diseases: Secondary | ICD-10-CM | POA: Diagnosis not present

## 2020-03-10 DIAGNOSIS — Z79899 Other long term (current) drug therapy: Secondary | ICD-10-CM | POA: Insufficient documentation

## 2020-03-10 DIAGNOSIS — Z6837 Body mass index (BMI) 37.0-37.9, adult: Secondary | ICD-10-CM | POA: Insufficient documentation

## 2020-03-10 DIAGNOSIS — Z1211 Encounter for screening for malignant neoplasm of colon: Secondary | ICD-10-CM | POA: Diagnosis not present

## 2020-03-10 DIAGNOSIS — Z9049 Acquired absence of other specified parts of digestive tract: Secondary | ICD-10-CM | POA: Insufficient documentation

## 2020-03-10 DIAGNOSIS — E7800 Pure hypercholesterolemia, unspecified: Secondary | ICD-10-CM | POA: Insufficient documentation

## 2020-03-10 DIAGNOSIS — Z801 Family history of malignant neoplasm of trachea, bronchus and lung: Secondary | ICD-10-CM | POA: Insufficient documentation

## 2020-03-10 DIAGNOSIS — E1122 Type 2 diabetes mellitus with diabetic chronic kidney disease: Secondary | ICD-10-CM | POA: Diagnosis not present

## 2020-03-10 DIAGNOSIS — M109 Gout, unspecified: Secondary | ICD-10-CM | POA: Insufficient documentation

## 2020-03-10 DIAGNOSIS — Z823 Family history of stroke: Secondary | ICD-10-CM | POA: Diagnosis not present

## 2020-03-10 DIAGNOSIS — Z7984 Long term (current) use of oral hypoglycemic drugs: Secondary | ICD-10-CM | POA: Insufficient documentation

## 2020-03-10 DIAGNOSIS — K219 Gastro-esophageal reflux disease without esophagitis: Secondary | ICD-10-CM | POA: Diagnosis not present

## 2020-03-10 DIAGNOSIS — N1831 Chronic kidney disease, stage 3a: Secondary | ICD-10-CM | POA: Diagnosis not present

## 2020-03-10 DIAGNOSIS — D124 Benign neoplasm of descending colon: Secondary | ICD-10-CM | POA: Insufficient documentation

## 2020-03-10 DIAGNOSIS — I10 Essential (primary) hypertension: Secondary | ICD-10-CM | POA: Insufficient documentation

## 2020-03-10 DIAGNOSIS — I129 Hypertensive chronic kidney disease with stage 1 through stage 4 chronic kidney disease, or unspecified chronic kidney disease: Secondary | ICD-10-CM | POA: Diagnosis not present

## 2020-03-10 HISTORY — PX: COLONOSCOPY WITH PROPOFOL: SHX5780

## 2020-03-10 LAB — GLUCOSE, CAPILLARY: Glucose-Capillary: 145 mg/dL — ABNORMAL HIGH (ref 70–99)

## 2020-03-10 SURGERY — COLONOSCOPY WITH PROPOFOL
Anesthesia: General

## 2020-03-10 MED ORDER — PROPOFOL 10 MG/ML IV BOLUS
INTRAVENOUS | Status: DC | PRN
  Administered 2020-03-10: 50 mg via INTRAVENOUS

## 2020-03-10 MED ORDER — GLYCOPYRROLATE 0.2 MG/ML IJ SOLN
INTRAMUSCULAR | Status: DC | PRN
  Administered 2020-03-10: .2 mg via INTRAVENOUS

## 2020-03-10 MED ORDER — PROPOFOL 500 MG/50ML IV EMUL
INTRAVENOUS | Status: DC | PRN
  Administered 2020-03-10: 155 ug/kg/min via INTRAVENOUS

## 2020-03-10 MED ORDER — SODIUM CHLORIDE 0.9 % IV SOLN
INTRAVENOUS | Status: DC
  Administered 2020-03-10: 1000 mL via INTRAVENOUS

## 2020-03-10 MED ORDER — LIDOCAINE HCL (CARDIAC) PF 100 MG/5ML IV SOSY
PREFILLED_SYRINGE | INTRAVENOUS | Status: DC | PRN
  Administered 2020-03-10: 100 mg via INTRAVENOUS

## 2020-03-10 NOTE — H&P (Signed)
Lucilla Lame, MD [address] [place], [place] [address] Phone:[phone] Fax : [phone]  Primary Care Physician:  Delsa Grana, PA-C Primary Gastroenterologist:  Dr. Allen Norris  Pre-Procedure History & Physical: HPI:  Cory Rivas is a 54 y.o. male is here for an colonoscopy.   Past Medical History:  Diagnosis Date  . Bulging of cervical intervertebral disc   . Diabetes mellitus without complication ([place])   . Gout   . High cholesterol     Past Surgical History:  Procedure Laterality Date  . CHOLECYSTECTOMY      Prior to Admission medications   Medication Sig Start Date End Date Taking? Authorizing Provider  allopurinol (ZYLOPRIM) 100 MG tablet Take 2 tablets (200 mg total) by mouth daily. 03/02/20  Yes Delsa Grana, PA-C  amLODipine (NORVASC) 10 MG tablet Take 1 tablet (10 mg total) by mouth daily. 03/01/20  Yes Delsa Grana, PA-C  DULoxetine (CYMBALTA) 30 MG capsule Take 1 capsule (30 mg total) by mouth daily. 03/01/20  Yes Delsa Grana, PA-C  indomethacin (INDOCIN) 50 MG capsule TAKE 1 CAPSULE BY MOUTH 3 TIMES DAILY ASNEEDED (GOUT FLARE). 02/04/20  Yes Sowles, Drue Stager, MD  lisinopril (ZESTRIL) 40 MG tablet Take 1 tablet (40 mg total) by mouth daily. 03/01/20  Yes Delsa Grana, PA-C  metFORMIN (GLUCOPHAGE) 1000 MG tablet Take 1 tablet (1,000 mg total) by mouth daily. 03/01/20  Yes Delsa Grana, PA-C  pregabalin (LYRICA) 25 MG capsule Take 1 capsule (25 mg total) by mouth 2 (two) times daily. 03/01/20  Yes Delsa Grana, PA-C  rosuvastatin (CRESTOR) 40 MG tablet Take 1 tablet (40 mg total) by mouth daily. 03/01/20  Yes Delsa Grana, PA-C  triamcinolone cream (KENALOG) 0.1 % Apply 1 application topically 2 (two) times daily. 03/19/19  Yes Hubbard Hartshorn, FNP    Allergies as of 03/03/2020  . (No Known Allergies)    Family History  Problem Relation Age of Onset  . Heart disease Mother   . Diabetes Mother   . Hyperlipidemia Mother   . Hypertension Mother   . Lung  cancer Father   . Hypertension Father   . Stroke Father   . AAA (abdominal aortic aneurysm) Maternal Grandmother   . Heart attack Maternal Grandmother   . Diabetes Maternal Grandfather     Social History   Socioeconomic History  . Marital status: Divorced    Spouse name: Not on file  . Number of children: 0  . Years of education: 9  . Highest education level: 9th grade  Occupational History  . Occupation: disability  Tobacco Use  . Smoking status: Current Every Day Smoker    Packs/day: 1.50    Types: Cigarettes    Start date: 10/19/1989  . Smokeless tobacco: Never Used  Substance and Sexual Activity  . Alcohol use: No  . Drug use: No  . Sexual activity: Not Currently    Partners: Female  Other Topics Concern  . Not on file  Social History Narrative  . Not on file   Social Determinants of Health   Financial Resource Strain: Medium Risk  . Difficulty of Paying Living Expenses: Somewhat hard  Food Insecurity: Food Insecurity Present  . Worried About Charity fundraiser in the Last Year: Sometimes true  . Ran Out of Food in the Last Year: Sometimes true  Transportation Needs: No Transportation Needs  . Lack of Transportation (Medical): No  . Lack of Transportation (Non-Medical): No  Physical Activity: Inactive  . Days of  Exercise per Week: 0 days  . Minutes of Exercise per Session: 0 min  Stress: Stress Concern Present  . Feeling of Stress : To some extent  Social Connections: Severely Isolated  . Frequency of Communication with Friends and Family: Once a week  . Frequency of Social Gatherings with Friends and Family: Never  . Attends Religious Services: Never  . Active Member of Clubs or Organizations: No  . Attends Archivist Meetings: Never  . Marital Status: Divorced  Human resources officer Violence: Not At Risk  . Fear of Current or Ex-Partner: No  . Emotionally Abused: No  . Physically Abused: No  . Sexually Abused: No    Review of Systems: See HPI,  otherwise negative ROS  Physical Exam: BP (!) 177/102   Pulse 67   Temp (!) 97.2 F (36.2 C) (Temporal)   Resp 20   Ht 5\' 7"  (1.702 m)   Wt 109.1 kg   SpO2 99%   BMI 37.67 kg/m  General:   Alert,  pleasant and cooperative in NAD Head:  Normocephalic and atraumatic. Neck:  Supple; no masses or thyromegaly. Lungs:  Clear throughout to auscultation.    Heart:  Regular rate and rhythm. Abdomen:  Soft, nontender and nondistended. Normal bowel sounds, without guarding, and without rebound.   Neurologic:  Alert and  oriented x4;  grossly normal neurologically.  Impression/Plan: Cory Rivas is here for an colonoscopy to be performed for screening  Risks, benefits, limitations, and alternatives regarding  colonoscopy have been reviewed with the patient.  Questions have been answered.  All parties agreeable.   Lucilla Lame, MD  03/10/2020, 8:05 AM

## 2020-03-10 NOTE — Anesthesia Postprocedure Evaluation (Signed)
Anesthesia Post Note  Patient: Cory Rivas  Procedure(s) Performed: COLONOSCOPY WITH PROPOFOL (N/A )  Patient location during evaluation: Endoscopy Anesthesia Type: General Level of consciousness: awake and alert Pain management: pain level controlled Vital Signs Assessment: post-procedure vital signs reviewed and stable Respiratory status: spontaneous breathing, nonlabored ventilation, respiratory function stable and patient connected to nasal cannula oxygen Cardiovascular status: blood pressure returned to baseline and stable Postop Assessment: no apparent nausea or vomiting Anesthetic complications: no     Last Vitals:  Vitals:   03/10/20 0900 03/10/20 0910  BP: (!) 152/91 133/71  Pulse: 66 65  Resp: (!) 23 (!) 21  Temp:    SpO2: 96% 96%    Last Pain:  Vitals:   03/10/20 0840  TempSrc: Temporal  PainSc:                  Martha Clan

## 2020-03-10 NOTE — Anesthesia Preprocedure Evaluation (Signed)
Anesthesia Evaluation  Patient identified by MRN, date of birth, ID band Patient awake    Reviewed: Allergy & Precautions, H&P , NPO status , Patient's Chart, lab work & pertinent test results, reviewed documented beta blocker date and time   History of Anesthesia Complications Negative for: history of anesthetic complications  Airway Mallampati: II  TM Distance: >3 FB Neck ROM: full    Dental  (+) Dental Advidsory Given, Missing, Teeth Intact, Edentulous Upper   Pulmonary neg shortness of breath, neg sleep apnea, neg COPD, neg recent URI, Current Smoker,    Pulmonary exam normal breath sounds clear to auscultation       Cardiovascular Exercise Tolerance: Good hypertension, (-) angina(-) Past MI and (-) Cardiac Stents Normal cardiovascular exam(-) dysrhythmias (-) Valvular Problems/Murmurs Rhythm:regular Rate:Normal     Neuro/Psych negative neurological ROS  negative psych ROS   GI/Hepatic Neg liver ROS, GERD  ,  Endo/Other  diabetesMorbid obesity  Renal/GU CRFRenal disease  negative genitourinary   Musculoskeletal   Abdominal   Peds  Hematology negative hematology ROS (+)   Anesthesia Other Findings Past Medical History: No date: Bulging of cervical intervertebral disc No date: Diabetes mellitus without complication (HCC) No date: Gout No date: High cholesterol   Reproductive/Obstetrics negative OB ROS                             Anesthesia Physical Anesthesia Plan  ASA: III  Anesthesia Plan: General   Post-op Pain Management:    Induction: Intravenous  PONV Risk Score and Plan: Propofol infusion and TIVA  Airway Management Planned: Natural Airway and Nasal Cannula  Additional Equipment:   Intra-op Plan:   Post-operative Plan:   Informed Consent: I have reviewed the patients History and Physical, chart, labs and discussed the procedure including the risks, benefits and  alternatives for the proposed anesthesia with the patient or authorized representative who has indicated his/her understanding and acceptance.     Dental Advisory Given  Plan Discussed with: Anesthesiologist, CRNA and Surgeon  Anesthesia Plan Comments:         Anesthesia Quick Evaluation

## 2020-03-10 NOTE — Op Note (Signed)
[facility] Gastroenterology Patient Name: Cory Rivas Procedure Date: 03/10/2020 7:54 AM MRN: [id number] Account #: [id number] Date of Birth: November 28, 1965 Admit Type: Outpatient Age: 54 Room: [facility] ENDO ROOM 4 Gender: Male Note Status: Finalized Procedure:             Colonoscopy Indications:           Screening for colorectal malignant neoplasm Providers:             Lucilla Lame MD, MD Medicines:             Propofol per Anesthesia Complications:         No immediate complications. Procedure:             Pre-Anesthesia Assessment:                        - Prior to the procedure, a History and Physical was                         performed, and patient medications and allergies were                         reviewed. The patient's tolerance of previous                         anesthesia was also reviewed. The risks and benefits                         of the procedure and the sedation options and risks                         were discussed with the patient. All questions were                         answered, and informed consent was obtained. Prior                         Anticoagulants: The patient has taken no previous                         anticoagulant or antiplatelet agents. ASA Grade                         Assessment: II - A patient with mild systemic disease.                         After reviewing the risks and benefits, the patient                         was deemed in satisfactory condition to undergo the                         procedure.                        After obtaining informed consent, the colonoscope was                         passed under direct vision. Throughout the procedure,  the patient's blood pressure, pulse, and oxygen                         saturations were monitored continuously. The                         Colonoscope was introduced through the anus and                         advanced to the the cecum,  identified by appendiceal                         orifice and ileocecal valve. The colonoscopy was                         performed without difficulty. The patient tolerated                         the procedure well. The quality of the bowel                         preparation was fair. Findings:      The perianal and digital rectal examinations were normal.      Two sessile polyps were found in the ascending colon. The polyps were 3       to 4 mm in size. These polyps were removed with a cold snare. Resection       and retrieval were complete.      A 4 mm polyp was found in the descending colon. The polyp was sessile.       The polyp was removed with a cold snare. Resection and retrieval were       complete.      A 4 mm polyp was found in the rectum. The polyp was sessile. The polyp       was removed with a cold snare. Resection and retrieval were complete. Impression:            - Preparation of the colon was fair.                        - Two 3 to 4 mm polyps in the ascending colon, removed                         with a cold snare. Resected and retrieved.                        - One 4 mm polyp in the descending colon, removed with                         a cold snare. Resected and retrieved.                        - One 4 mm polyp in the rectum, removed with a cold                         snare. Resected and retrieved. Recommendation:        - Discharge patient to home.                        -  Resume previous diet.                        - Continue present medications.                        - Await pathology results.                        - Repeat colonoscopy in 3 years because the bowel                         preparation was poor. Procedure Code(s):     --- Professional ---                        [id number], Colonoscopy, flexible; with removal of                         tumor(s), polyp(s), or other lesion(s) by snare                         technique Diagnosis Code(s):     ---  Professional ---                        Z12.11, Encounter for screening for malignant neoplasm                         of colon                        K63.5, Polyp of colon                        K62.1, Rectal polyp CPT copyright 2019 American Medical Association. All rights reserved. The codes documented in this report are preliminary and upon coder review may  be revised to meet current compliance requirements. Lucilla Lame MD, MD 03/10/2020 8:38:26 AM This report has been signed electronically. Number of Addenda: 0 Note Initiated On: 03/10/2020 7:54 AM Scope Withdrawal Time: 0 hours 10 minutes 43 seconds  Total Procedure Duration: 0 hours 12 minutes 3 seconds  Estimated Blood Loss:  Estimated blood loss: none.      [facility]

## 2020-03-10 NOTE — Transfer of Care (Signed)
Immediate Anesthesia Transfer of Care Note  Patient: Cory Rivas  Procedure(s) Performed: COLONOSCOPY WITH PROPOFOL (N/A )  Patient Location: Endoscopy Unit  Anesthesia Type:General  Level of Consciousness: awake, alert , oriented and patient cooperative  Airway & Oxygen Therapy: Patient Spontanous Breathing  Post-op Assessment: Report given to RN and Post -op Vital signs reviewed and stable  Post vital signs: Reviewed and stable  Last Vitals:  Vitals Value Taken Time  BP 150/88 03/10/20 0841  Temp    Pulse 75 03/10/20 0843  Resp 18 03/10/20 0843  SpO2 97 % 03/10/20 0843  Vitals shown include unvalidated device data.  Last Pain:  Vitals:   03/10/20 0840  TempSrc: Temporal  PainSc:          Complications: No apparent anesthesia complications

## 2020-03-11 ENCOUNTER — Encounter: Payer: Self-pay | Admitting: *Deleted

## 2020-03-11 LAB — SURGICAL PATHOLOGY

## 2020-03-15 ENCOUNTER — Encounter: Payer: Self-pay | Admitting: Gastroenterology

## 2020-03-23 ENCOUNTER — Ambulatory Visit: Payer: Medicaid Other | Admitting: Family Medicine

## 2020-03-24 ENCOUNTER — Other Ambulatory Visit: Payer: Self-pay

## 2020-03-24 ENCOUNTER — Encounter: Payer: Self-pay | Admitting: Family Medicine

## 2020-03-24 ENCOUNTER — Ambulatory Visit: Payer: Medicaid Other | Admitting: Family Medicine

## 2020-03-24 VITALS — BP 132/78 | HR 77 | Temp 97.9°F | Resp 14 | Ht 67.0 in | Wt 253.8 lb

## 2020-03-24 DIAGNOSIS — G8929 Other chronic pain: Secondary | ICD-10-CM | POA: Diagnosis not present

## 2020-03-24 DIAGNOSIS — E1169 Type 2 diabetes mellitus with other specified complication: Secondary | ICD-10-CM | POA: Diagnosis not present

## 2020-03-24 DIAGNOSIS — M4722 Other spondylosis with radiculopathy, cervical region: Secondary | ICD-10-CM | POA: Diagnosis not present

## 2020-03-24 DIAGNOSIS — I10 Essential (primary) hypertension: Secondary | ICD-10-CM | POA: Diagnosis not present

## 2020-03-24 DIAGNOSIS — M5136 Other intervertebral disc degeneration, lumbar region: Secondary | ICD-10-CM

## 2020-03-24 DIAGNOSIS — L309 Dermatitis, unspecified: Secondary | ICD-10-CM

## 2020-03-24 DIAGNOSIS — M549 Dorsalgia, unspecified: Secondary | ICD-10-CM

## 2020-03-24 DIAGNOSIS — E785 Hyperlipidemia, unspecified: Secondary | ICD-10-CM | POA: Diagnosis not present

## 2020-03-24 DIAGNOSIS — M1A09X0 Idiopathic chronic gout, multiple sites, without tophus (tophi): Secondary | ICD-10-CM | POA: Diagnosis not present

## 2020-03-24 MED ORDER — PREDNISONE 20 MG PO TABS: ORAL_TABLET | ORAL | 0 refills | Status: DC

## 2020-03-24 MED ORDER — TRIAMCINOLONE ACETONIDE 0.1 % EX CREA: 1.0000 "application " | TOPICAL_CREAM | Freq: Two times a day (BID) | CUTANEOUS | 0 refills | Status: DC

## 2020-03-24 MED ORDER — ALLOPURINOL 300 MG PO TABS: 300.0000 mg | ORAL_TABLET | Freq: Every day | ORAL | 3 refills | Status: DC

## 2020-03-24 MED ORDER — INDOMETHACIN 25 MG PO CAPS: ORAL_CAPSULE | ORAL | 3 refills | Status: DC

## 2020-03-24 NOTE — Patient Instructions (Addendum)
Keep taking lisinopril 40 mg and amlodipine 10 mg for blood pressure  Increase your allopurinol to 300 mg daily  Continue to take cymbalta daily - we can increase that dose more to help with chronic pain Take lyrica at night if it makes you tired  Follow up with your spine/back specialist  We will see you in two months to recheck labs   Call me if you cannot get your meds at [place] - I send in year supply of almost all your meds in May

## 2020-03-24 NOTE — Progress Notes (Signed)
Patient ID: WEYLAND VERVILLE, male    DOB: 04/15/1966, 54 y.o.   MRN: [id number]  PCP: Danelle Berry, PA-C  Chief Complaint  Patient presents with  . Follow-up  . Hypertension    Subjective:   JAMARIO ELBAZ is a 54 y.o. male, presents to clinic with CC of the following:  HPI  Hypertension:  Currently managed on lisinopril 40 mg and amlodipine 10 mg Pt reports good med compliance and denies any SE.  No lightheadedness, hypotension, syncope. Blood pressure today is well controlled - improved after med changes BP Readings from Last 5 Encounters:  03/24/20 132/78  03/10/20 133/71  03/01/20 (!) 176/90  12/02/19 (!) 170/99  08/01/19 (!) 142/72   Pt denies CP, LE edema, palpitation, Ha's, visual disturbances  3. Essential hypertension Blood pressure uncontrolled for the past several months, he does have history of whitecoat hypertension and he is very uncomfortable today is obviously in pain, do feel that his blood pressure still will need to be improved even with anxiety and pain likely elevating blood pressure above his norm. Discussed increasing his medications will stop lisinopril 20 mg and start lisinopril 40 mg.  Can add amlodipine 10 mg if blood pressure is still elevated over 150/90, patient to monitor and we will do a close follow-up. He is currently having headaches and neck pain from a recent injury, do not suspect headaches are related to his blood pressure.  He denies any chest pain or pressure, he does have some fullness and possible edema to his lower extremities but he has no pitting edema and has no other signs or symptoms concerning for endorgan damage. Encouraged him to work on [employer], healthy lifestyle, we will try to help manage his pain with plan below Close follow-up - COMPLETE METABOLIC PANEL WITH GFR - amLODipine (NORVASC) 10 MG tablet; Take 1 tablet (10 mg total) by mouth daily.  Dispense: 90 tablet; Refill: 3 - lisinopril (ZESTRIL) 40 MG tablet; Take 1  tablet (40 mg total) by mouth daily.  Dispense: 90 tablet; Refill: 3   Neck and back pain  cymbalta and lyrica - Pt previously had severe neck and back pain - with radiculopathy and spasms - started meds, lyrica makes him a little tired, pain is much better 11. Radiculopathy, cervical Patient has had a neck injury, previously evaluated and managed by specialist.  He reports no improvement with steroids, muscle relaxers, Toradol daily.  He continues to have radicular symptoms, explained that trying these medications may help manage his chronic pain while he is still working with a specialist or seeking out second opinions. - DULoxetine (CYMBALTA) 30 MG capsule; Take 1 capsule (30 mg total) by mouth daily.  Dispense: 90 capsule; Refill: 3 - pregabalin (LYRICA) 25 MG capsule; Take 1 capsule (25 mg total) by mouth 2 (two) times daily.  Dispense: 180 capsule; Refill: 1   Weight down about 9 lbs Wt Readings from Last 5 Encounters:  03/24/20 253 lb 12.8 oz (115.1 kg)  03/10/20 240 lb 8 oz (109.1 kg)  03/01/20 262 lb 8 oz (119.1 kg)  12/02/19 251 lb (113.9 kg)  08/01/19 251 lb 5.2 oz (114 kg)   BMI Readings from Last 5 Encounters:  03/24/20 39.75 kg/m  03/10/20 37.67 kg/m  03/01/20 41.11 kg/m  12/02/19 39.31 kg/m  08/01/19 39.36 kg/m   [facility] - he is going to follow up with them for his back and neck issues  Gout - he  increased allopurinol dose but still feels some gout flares Last uric acid level not at goal  Patient Active Problem List   Diagnosis Date Noted  . Encounter for screening colonoscopy   . Polyp of ascending colon   . Rectal polyp   . Class 3 severe obesity due to excess calories with serious comorbidity and body mass index (BMI) of 40.0 to 44.9 in adult (HCC) 04/15/2019  . Stress due to family tension 04/15/2019  . Chronic kidney disease (CKD) stage G2/A3, mildly decreased glomerular filtration rate (GFR) between 60-89 mL/min/1.73  square meter and albuminuria creatinine ratio greater than 300 mg/g 04/15/2019  . Proteinuria 04/15/2019  . Hyperlipidemia associated with type 2 diabetes mellitus (HCC) 03/18/2019  . Type 2 diabetes mellitus with other diabetic kidney complication (HCC) 03/18/2019  . Idiopathic chronic gout of multiple sites without tophus 03/18/2019  . Essential hypertension 03/18/2019  . Dermatitis 03/18/2019  . Lumbar degenerative disc disease 11/22/2015  . Osteoarthritis of spine with radiculopathy, cervical region 11/22/2015  . Osteoarthritis of spine with radiculopathy, lumbar region 11/22/2015      Current Outpatient Medications:  .  allopurinol (ZYLOPRIM) 100 MG tablet, Take 2 tablets (200 mg total) by mouth daily., Disp: 180 tablet, Rfl: 3 .  amLODipine (NORVASC) 10 MG tablet, Take 1 tablet (10 mg total) by mouth daily., Disp: 90 tablet, Rfl: 3 .  DULoxetine (CYMBALTA) 30 MG capsule, Take 1 capsule (30 mg total) by mouth daily., Disp: 90 capsule, Rfl: 3 .  indomethacin (INDOCIN) 50 MG capsule, TAKE 1 CAPSULE BY MOUTH 3 TIMES DAILY ASNEEDED (GOUT FLARE)., Disp: 30 capsule, Rfl: 0 .  lisinopril (ZESTRIL) 40 MG tablet, Take 1 tablet (40 mg total) by mouth daily., Disp: 90 tablet, Rfl: 3 .  metFORMIN (GLUCOPHAGE) 1000 MG tablet, Take 1 tablet (1,000 mg total) by mouth daily., Disp: 90 tablet, Rfl: 3 .  pregabalin (LYRICA) 25 MG capsule, Take 1 capsule (25 mg total) by mouth 2 (two) times daily., Disp: 180 capsule, Rfl: 1 .  rosuvastatin (CRESTOR) 40 MG tablet, Take 1 tablet (40 mg total) by mouth daily., Disp: 90 tablet, Rfl: 3 .  triamcinolone cream (KENALOG) 0.1 %, Apply 1 application topically 2 (two) times daily., Disp: 30 g, Rfl: 0   No Known Allergies   Family History  Problem Relation Age of Onset  . Heart disease Mother   . Diabetes Mother   . Hyperlipidemia Mother   . Hypertension Mother   . Lung cancer Father   . Hypertension Father   . Stroke Father   . AAA (abdominal aortic  aneurysm) Maternal Grandmother   . Heart attack Maternal Grandmother   . Diabetes Maternal Grandfather      Social History   Socioeconomic History  . Marital status: Divorced    Spouse name: Not on file  . Number of children: 0  . Years of education: 9  . Highest education level: 9th grade  Occupational History  . Occupation: disability  Tobacco Use  . Smoking status: Current Every Day Smoker    Packs/day: 1.50    Types: Cigarettes    Start date: 10/19/1989  . Smokeless tobacco: Never Used  Vaping Use  . Vaping Use: Never used  Substance and Sexual Activity  . Alcohol use: No  . Drug use: No  . Sexual activity: Not Currently    Partners: Female  Other Topics Concern  . Not on file  Social History Narrative  . Not on file   Social Determinants of  Health   Financial Resource Strain: Medium Risk  . Difficulty of Paying Living Expenses: Somewhat hard  Food Insecurity: Food Insecurity Present  . Worried About Programme researcher, broadcasting/film/video in the Last Year: Sometimes true  . Ran Out of Food in the Last Year: Sometimes true  Transportation Needs:   . Lack of Transportation (Medical):   Marland Kitchen Lack of Transportation (Non-Medical):   Physical Activity:   . Days of Exercise per Week:   . Minutes of Exercise per Session:   Stress:   . Feeling of Stress :   Social Connections: Unknown  . Frequency of Communication with Friends and Family: Not on file  . Frequency of Social Gatherings with Friends and Family: Never  . Attends Religious Services: Not on file  . Active Member of Clubs or Organizations: Not on file  . Attends Banker Meetings: Not on file  . Marital Status: Not on file  Intimate Partner Violence:   . Fear of Current or Ex-Partner:   . Emotionally Abused:   Marland Kitchen Physically Abused:   . Sexually Abused:     Chart Review Today: I personally reviewed active problem list, medication list, allergies, family history, social history, health maintenance, notes from  last encounter, lab results, imaging with the patient/caregiver today.   Review of Systems 10 Systems reviewed and are negative for acute change except as noted in the HPI.     Objective:   Vitals:   03/24/20 1513  BP: 132/78  Pulse: 77  Resp: 14  Temp: 97.9 F (36.6 C)  SpO2: 96%  Weight: 253 lb 12.8 oz (115.1 kg)  Height: 5\' 7"  (1.702 m)    Body mass index is 39.75 kg/m.  Physical Exam Vitals and nursing note reviewed.  Constitutional:      General: He is not in acute distress.    Appearance: Normal appearance. He is obese. He is not toxic-appearing or diaphoretic.  HENT:     Right Ear: External ear normal.     Left Ear: External ear normal.  Cardiovascular:     Rate and Rhythm: Normal rate and regular rhythm.     Pulses: Normal pulses.     Heart sounds: Normal heart sounds.  Pulmonary:     Effort: Pulmonary effort is normal.     Breath sounds: Normal breath sounds.  Abdominal:     General: Bowel sounds are normal.     Palpations: Abdomen is soft.  Skin:    General: Skin is warm and dry.     Coloration: Skin is not jaundiced or pale.     Findings: Rash present.  Neurological:     Mental Status: He is alert.     Gait: Gait normal.  Psychiatric:        Mood and Affect: Mood normal.        Behavior: Behavior normal.            Assessment & Plan:      ICD-10-CM   1. Essential hypertension  I10    BP improved, well controlled today - continue norvasc 10 mg and lisinopril 40 mg, continue healthy diet/lifestyle changes as well  2. Idiopathic chronic gout of multiple sites without tophus  M1A.09X0 allopurinol (ZYLOPRIM) 300 MG tablet    predniSONE (DELTASONE) 20 MG tablet    indomethacin (INDOCIN) 25 MG capsule   prednisone burst while increasing allopurinol again, indomethicin refill adjusted, f/up in 2 months  3. Osteoarthritis of spine with radiculopathy, cervical region  M47.22  pts pain improving  4. Lumbar degenerative disc disease  M51.36     see below  5. Chronic back pain, unspecified back location, unspecified back pain laterality  M54.9    G89.29    improving pain and function to back and neck with cymbalta and lyrica  6. Dermatitis  L30.9 triamcinolone cream (KENALOG) 0.1 %    predniSONE (DELTASONE) 20 MG tablet   possibly contact dermatitis, smaller area, trial of topical steroids, f/up as needed  7. Hyperlipidemia associated with type 2 diabetes mellitus (HCC)  E11.69    E78.5    tolerating crestor     Instructions to pt today: Keep taking lisinopril 40 mg and amlodipine 10 mg for blood pressure  Increase your allopurinol to 300 mg daily  Continue to take cymbalta daily - we can increase that dose more to help with chronic pain Take lyrica at night if it makes you tired  Follow up with your spine/back specialist  We will see you in two months to recheck labs   Call me if you cannot get your meds at [facility] pharmacy - I send in year supply of almost all your meds in May    Halo Laski Angelica Chessman, [place] 03/24/20 3:29 PM

## 2020-04-26 ENCOUNTER — Encounter: Payer: Self-pay | Admitting: Family Medicine

## 2020-05-09 ENCOUNTER — Other Ambulatory Visit: Payer: Self-pay | Admitting: Family Medicine

## 2020-05-09 DIAGNOSIS — M1A09X0 Idiopathic chronic gout, multiple sites, without tophus (tophi): Secondary | ICD-10-CM

## 2020-05-09 DIAGNOSIS — L309 Dermatitis, unspecified: Secondary | ICD-10-CM

## 2020-06-30 NOTE — Progress Notes (Signed)
Name: Cory Rivas   MRN: [id number]    DOB: 01-08-1966   Date:07/01/2020       Progress Note   Chief Complaint  Patient presents with  . Follow-up  . Diabetes  . Hypertension    Subjective:   Cory Rivas is a 54 y.o. male, presents to clinic for routine f/up  DM:   Pt managing DM with Metformin 1000 mg qd Reports good med compliance Pt has no SE from meds. Blood sugars not checking Denies: Polyuria, polydipsia, vision changes, neuropathy, hypoglycemia - nails long thick Recent pertinent labs: Lab Results  Component Value Date   HGBA1C 6.8 (H) 03/01/2020   HGBA1C 6.1 (H) 06/18/2019   HGBA1C 5.9 (H) 03/18/2019   Standard of care and health maintenance: Foot exam: due today DM eye exam:  Order placed last visit ACEI/ARB:  Lisinopril Statin:  Crestor    Hypertension:  Currently managed on Amlodipine 10 mg qd and Lisinopril 40 mg qd Pt reports - not taking meds, forgetting  Blood pressure today is uncontrolled - repeated not much better  BP Readings from Last 3 Encounters:  07/01/20 (!) 176/90  03/24/20 132/78  03/10/20 133/71   Pt denies CP, SOB, LE edema, palpitation, Ha's, visual disturbances, lightheadedness, hypotension, syncope. Off meds for weeks - vague sx with heat and physical activity, no sx at rest    Hyperlipidemia: Currently treated with crestor 40 mg, pt reports good med compliance Last Lipids: Lab Results  Component Value Date   CHOL 205 (H) 03/01/2020   HDL 39 (L) 03/01/2020   LDLCALC 131 (H) 03/01/2020   TRIG 213 (H) 03/01/2020   CHOLHDL 5.3 (H) 03/01/2020   - Denies: Chest pain, shortness of breath, myalgias, claudication  Chronic neck and back pain -previously was given muscle relaxers and Lyrica he is seeing specialist as well he does seem to forget Lyrica medication Also managed on Cymbalta  Nails long thick and curled  Trouble with heat  Family hx of stroke, MI, HTN, HLD  Morbid obesity -BMI ranges 39-40 down a few  pounds from his last office visit Wt Readings from Last 5 Encounters:  07/01/20 250 lb 8 oz (113.6 kg)  03/24/20 253 lb 12.8 oz (115.1 kg)  03/10/20 240 lb 8 oz (109.1 kg)  03/01/20 262 lb 8 oz (119.1 kg)  12/02/19 251 lb (113.9 kg)   BMI Readings from Last 5 Encounters:  07/01/20 39.23 kg/m  03/24/20 39.75 kg/m  03/10/20 37.67 kg/m  03/01/20 41.11 kg/m  12/02/19 39.31 kg/m   Multiple comorbidities including diabetes, hypertension, hyperlipidemia, CKD  Gout: He has had a few flares since his last office visit -on allopurinol 300 mg and indomethacin as needed he recently got a steroid taper to treat a gouty flare  Current heavy smoker -at least 1-1/2 to 2 packs/day  Current Outpatient Medications:  .  allopurinol (ZYLOPRIM) 300 MG tablet, Take 1 tablet (300 mg total) by mouth daily., Disp: 90 tablet, Rfl: 3 .  amLODipine (NORVASC) 10 MG tablet, Take 1 tablet (10 mg total) by mouth daily., Disp: 90 tablet, Rfl: 3 .  DULoxetine (CYMBALTA) 30 MG capsule, Take 1 capsule (30 mg total) by mouth daily., Disp: 90 capsule, Rfl: 3 .  indomethacin (INDOCIN) 25 MG capsule, Take 50 mg PO TID x 2 d at onset of gout flare, then take 25 mg po TID x 3 d, Disp: 20 capsule, Rfl: 3 .  lisinopril (ZESTRIL) 40 MG tablet, Take 1 tablet (40 mg  total) by mouth daily., Disp: 90 tablet, Rfl: 3 .  metFORMIN (GLUCOPHAGE) 1000 MG tablet, Take 1 tablet (1,000 mg total) by mouth daily., Disp: 90 tablet, Rfl: 3 .  pregabalin (LYRICA) 25 MG capsule, Take 1 capsule (25 mg total) by mouth 2 (two) times daily., Disp: 180 capsule, Rfl: 1 .  rosuvastatin (CRESTOR) 40 MG tablet, Take 1 tablet (40 mg total) by mouth daily., Disp: 90 tablet, Rfl: 3 .  triamcinolone cream (KENALOG) 0.1 %, Apply 1 application topically 2 (two) times daily., Disp: 30 g, Rfl: 0 .  predniSONE (DELTASONE) 20 MG tablet, 2 tabs poqday 1-3, 1 tabs poqday 4-6 (Patient not taking: Reported on 07/01/2020), Disp: 9 tablet, Rfl: 0  Patient Active  Problem List   Diagnosis Date Noted  . Encounter for screening colonoscopy   . Polyp of ascending colon   . Rectal polyp   . Class 3 severe obesity due to excess calories with serious comorbidity and body mass index (BMI) of 40.0 to 44.9 in adult ([place]) 04/15/2019  . Stress due to family tension 04/15/2019  . Chronic kidney disease (CKD) stage G2/A3, mildly decreased glomerular filtration rate (GFR) between 60-89 mL/min/1.73 square meter and albuminuria creatinine ratio greater than 300 mg/g 04/15/2019  . Proteinuria 04/15/2019  . Hyperlipidemia associated with type 2 diabetes mellitus ([place]) 03/18/2019  . Type 2 diabetes mellitus with other diabetic kidney complication ([place]) 15/94/5859  . Idiopathic chronic gout of multiple sites without tophus 03/18/2019  . Essential hypertension 03/18/2019  . Dermatitis 03/18/2019  . Lumbar degenerative disc disease 11/22/2015  . Osteoarthritis of spine with radiculopathy, cervical region 11/22/2015  . Osteoarthritis of spine with radiculopathy, lumbar region 11/22/2015    Past Surgical History:  Procedure Laterality Date  . CHOLECYSTECTOMY    . COLONOSCOPY WITH PROPOFOL N/A 03/10/2020   Procedure: COLONOSCOPY WITH PROPOFOL;  Surgeon: Lucilla Lame, MD;  Location: [facility] ENDOSCOPY;  Service: Endoscopy;  Laterality: N/A;    Family History  Problem Relation Age of Onset  . Heart disease Mother   . Diabetes Mother   . Hyperlipidemia Mother   . Hypertension Mother   . Lung cancer Father   . Hypertension Father   . Stroke Father   . AAA (abdominal aortic aneurysm) Maternal Grandmother   . Heart attack Maternal Grandmother   . Diabetes Maternal Grandfather     Social History   Tobacco Use  . Smoking status: Current Every Day Smoker    Packs/day: 1.50    Types: Cigarettes    Start date: 10/19/1989  . Smokeless tobacco: Never Used  Vaping Use  . Vaping Use: Never used  Substance Use Topics  . Alcohol use: No  . Drug use: No     No Known  Allergies  Health Maintenance  Topic Date Due  . OPHTHALMOLOGY EXAM  Never done  . HEMOGLOBIN A1C  09/01/2020  . FOOT EXAM  07/01/2021  . COLONOSCOPY  03/11/2023  . TETANUS/TDAP  04/14/2029  . INFLUENZA VACCINE  Completed  . PNEUMOCOCCAL POLYSACCHARIDE VACCINE AGE 34-64 HIGH RISK  Completed  . COVID-19 Vaccine  Completed  . Hepatitis C Screening  Completed  . HIV Screening  Completed    Chart Review Today: I personally reviewed active problem list, medication list, allergies, family history, social history, health maintenance, notes from last encounter, lab results, imaging with the patient/caregiver today.   Review of Systems  10 Systems reviewed and are negative for acute change except as noted in the HPI.  Objective:  Vitals:   07/01/20 0944 07/01/20 1013  BP: (!) 180/90 (!) 176/90  Pulse: 71   Resp: 18   Temp: 98.1 F (36.7 C)   TempSrc: Oral   SpO2: 98%   Weight: 250 lb 8 oz (113.6 kg)   Height: 5\' 7"  (1.702 m)     Body mass index is 39.23 kg/m.  Physical Exam Vitals and nursing note reviewed.  Constitutional:      Appearance: He is well-developed. He is morbidly obese. He is not toxic-appearing or diaphoretic.     Comments: Potent cigarette smoke odor  HENT:     Head: Normocephalic and atraumatic.     Right Ear: External ear normal.     Left Ear: External ear normal.  Eyes:     General: No scleral icterus.       Right eye: No discharge.        Left eye: No discharge.     Conjunctiva/sclera: Conjunctivae normal.  Cardiovascular:     Rate and Rhythm: Normal rate and regular rhythm.     Pulses:          Radial pulses are 2+ on the right side and 2+ on the left side.     Heart sounds: Heart sounds are distant.     Comments: Exam somewhat limited by body habitus Pulmonary:     Effort: Pulmonary effort is normal. No respiratory distress.     Breath sounds: Normal breath sounds. No wheezing, rhonchi or rales.  Abdominal:     General: Bowel sounds are  normal.     Tenderness: There is no abdominal tenderness.     Comments: Protuberant obese abdomen  Skin:    General: Skin is warm and dry.     Coloration: Skin is not jaundiced or pale.  Neurological:     Mental Status: He is alert. Mental status is at baseline.  Psychiatric:        Mood and Affect: Mood normal.        Behavior: Behavior normal. Behavior is cooperative.     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 Type 2 diabetes mellitus with other specified complication, without long-term current use of insulin (HCC)  E11.69 HM Diabetes Foot Exam    Microalbumin, urine    Urinalysis, Routine w reflex microscopic    Hemoglobin A1C    CANCELED: Hemoglobin A1C    CANCELED: Urine Microalbumin w/creat. ratio   Managed on Metformin, patient compliant with meds and tolerating  2. Essential hypertension  I10 Microalbumin, urine    Urinalysis, Routine w reflex microscopic    COMPLETE METABOLIC PANEL WITH GFR    CANCELED: COMPLETE METABOLIC PANEL WITH GFR   Uncontrolled, patient noncompliant with medications has not taken in a few weeks managed on lisinopril and Norvasc  3. Idiopathic chronic gout of multiple sites without tophus  [id number].29B2    Recent gouty flare managed with allopurinol, indomethacin and prednisone  4. Hyperlipidemia associated with type 2 diabetes mellitus (HCC)  E11.69 Lipid panel   W41.3 COMPLETE METABOLIC PANEL WITH GFR    CANCELED: Lipid panel   On statin, questionable med compliance denies any myalgias or side effects, encouraged diet lifestyle efforts and improve med compliance  5. Encounter for medication monitoring  Z51.81 Lipid panel    Hemoglobin A1C    COMPLETE METABOLIC PANEL WITH GFR    CANCELED: Hemoglobin  A1C    CANCELED: Lipid panel    CANCELED: COMPLETE METABOLIC PANEL WITH GFR  6. Need for immunization against influenza  Z23 Flu Vaccine QUAD 36+ mos IM  7. Hypertensive urgency  I16.0 Microalbumin, urine    Urinalysis, Routine w reflex microscopic    Lipid panel    Hemoglobin A1C    COMPLETE METABOLIC PANEL WITH GFR    CBC with Differential/Platelet    Brain natriuretic peptide   Off meds for at least 3 weeks blood pressure 180/90 some symptoms with hot weather none specifically with exertion and currently asymptomatic here at rest  8. Exertional dyspnea  Q25.95 COMPLETE METABOLIC PANEL WITH GFR    Brain natriuretic peptide   Suspect multifactorial including deconditioning, morbid obesity/OHS, smoking/lung dz?  RR and SpO2 WNL today  9. Class 2 severe obesity with serious comorbidity and body mass index (BMI) of 39.0 to 39.9 in adult, unspecified obesity type ([place])  E66.01    Z68.74    with comorbidities HTN, DM, HLD, chornic pain - encouraged diet/lifestyle efforts, offered referrals, encouraged calorie and portion reduction for weight loss   Reviewed with the patient hypertensive urgency and concerning signs and symptoms of endorgan damage.  Here at rest today in the exam room and with exertion and ambulation around the clinic patient does not have any alarming symptoms.  He is experiencing some heat intolerance and he has a history of exertional dyspnea which he noted to be slightly worse will check a BNP today in addition to other labs, he denies orthopnea he has no pitting edema, no distress with ambulation around the clinic today, no weight increase or third spacing suspect that his several reasons for dyspnea including his morbid obesity, heavy smoking, likely some obesity hypoventilation syndrome, deconditioning, he is high risk with his weight and other medical problems did review with him signs and symptoms consistent with unstable angina which he may experience some demand ischemia  with his blood pressure this high if he does a lot of physical activity encouraged him to rest if he experiences any chest pressure diaphoresis lightheadedness, significant shortness of breath that does not improve with resting gave him a handout and annotated it for him today   Greater than 50% of this visit was spent in direct face-to-face counseling, obtaining history and physical, discussing and educating pt on treatment plan.  Total time of this visit was 50+ min.  Remainder of time involved but was not limited to reviewing chart (recent and pertinent OV notes and labs), documentation in EMR, and coordinating care and treatment plan.   Return in about 1 month (around 07/31/2020) for HTN f/up - needs to come sooner if BP at home >150/90.   Delsa Grana, PA-C 07/01/20 9:52 AM

## 2020-07-01 ENCOUNTER — Other Ambulatory Visit: Payer: Self-pay

## 2020-07-01 ENCOUNTER — Ambulatory Visit: Payer: Medicaid Other | Admitting: Family Medicine

## 2020-07-01 ENCOUNTER — Encounter: Payer: Self-pay | Admitting: Family Medicine

## 2020-07-01 VITALS — BP 176/90 | HR 71 | Temp 98.1°F | Resp 18 | Ht 67.0 in | Wt 250.5 lb

## 2020-07-01 DIAGNOSIS — E785 Hyperlipidemia, unspecified: Secondary | ICD-10-CM | POA: Diagnosis not present

## 2020-07-01 DIAGNOSIS — M1A09X0 Idiopathic chronic gout, multiple sites, without tophus (tophi): Secondary | ICD-10-CM

## 2020-07-01 DIAGNOSIS — I10 Essential (primary) hypertension: Secondary | ICD-10-CM

## 2020-07-01 DIAGNOSIS — Z5181 Encounter for therapeutic drug level monitoring: Secondary | ICD-10-CM

## 2020-07-01 DIAGNOSIS — Z23 Encounter for immunization: Secondary | ICD-10-CM | POA: Diagnosis not present

## 2020-07-01 DIAGNOSIS — E1169 Type 2 diabetes mellitus with other specified complication: Secondary | ICD-10-CM

## 2020-07-01 DIAGNOSIS — R0600 Dyspnea, unspecified: Secondary | ICD-10-CM

## 2020-07-01 DIAGNOSIS — I160 Hypertensive urgency: Secondary | ICD-10-CM | POA: Diagnosis not present

## 2020-07-01 DIAGNOSIS — R0609 Other forms of dyspnea: Secondary | ICD-10-CM

## 2020-07-01 DIAGNOSIS — Z6839 Body mass index (BMI) 39.0-39.9, adult: Secondary | ICD-10-CM

## 2020-07-01 NOTE — Patient Instructions (Addendum)
Blood Glucose Monitoring, Adult Monitoring your blood sugar (glucose) is an important part of managing your diabetes (diabetes mellitus). Blood glucose monitoring involves checking your blood glucose as often as directed and keeping a record (log) of your results over time. Checking your blood glucose regularly and keeping a blood glucose log can:  Help you and your health care provider adjust your diabetes management plan as needed, including your medicines or insulin.  Help you understand how food, exercise, illnesses, and medicines affect your blood glucose.  Let you know what your blood glucose is at any time. You can quickly find out if you have low blood glucose (hypoglycemia) or high blood glucose (hyperglycemia). Your health care provider will set individualized treatment goals for you. Your goals will be based on your age, other medical conditions you have, and how you respond to diabetes treatment. Generally, the goal of treatment is to maintain the following blood glucose levels:  Before meals (preprandial): 80-130 mg/dL (4.4-7.2 mmol/L).  After meals (postprandial): below 180 mg/dL (10 mmol/L).  A1c level: less than 7%. Supplies needed:  Blood glucose meter.  Test strips for your meter. Each meter has its own strips. You must use the strips that came with your meter.  A needle to prick your finger (lancet). Do not use a lancet more than one time.  A device that holds the lancet (lancing device).  A journal or log book to write down your results. How to check your blood glucose  1. Wash your hands with soap and water. 2. Prick the side of your finger (not the tip) with the lancet. Use a different finger each time. 3. Gently rub the finger until a small drop of blood appears. 4. Follow instructions that come with your meter for inserting the test strip, applying blood to the strip, and using your blood glucose meter. 5. Write down your result and any notes. Some meters  allow you to use areas of your body other than your finger (alternative sites) to test your blood. The most common alternative sites are:  Forearm.  Thigh.  Palm of the hand. If you think you may have hypoglycemia, or if you have a history of not knowing when your blood glucose is getting low (hypoglycemia unawareness), do not use alternative sites. Use your finger instead. Alternative sites may not be as accurate as the fingers, because blood flow is slower in these areas. This means that the result you get may be delayed, and it may be different from the result that you would get from your finger. Follow these instructions at home: Blood glucose log   Every time you check your blood glucose, write down your result. Also write down any notes about things that may be affecting your blood glucose, such as your diet and exercise for the day. This information can help you and your health care provider: ? Look for patterns in your blood glucose over time. ? Adjust your diabetes management plan as needed.  Check if your meter allows you to download your records to a computer. Most glucose meters store a record of glucose readings in the meter. If you have type 1 diabetes:  Check your blood glucose 2 or more times a day.  Also check your blood glucose: ? Before every insulin injection. ? Before and after exercise. ? Before meals. ? 2 hours after a meal. ? Occasionally between 2:00 a.m. and 3:00 a.m., as directed. ? Before potentially dangerous tasks, like driving or using heavy machinery. ?   At bedtime.  You may need to check your blood glucose more often, up to 6-10 times a day, if you: ? Use an insulin pump. ? Need multiple daily injections (MDI). ? Have diabetes that is not well-controlled. ? Are ill. ? Have a history of severe hypoglycemia. ? Have hypoglycemia unawareness. If you have type 2 diabetes:  If you take insulin or other diabetes medicines, check your blood glucose 2 or  more times a day.  If you are on intensive insulin therapy, check your blood glucose 4 or more times a day. Occasionally, you may also need to check between 2:00 a.m. and 3:00 a.m., as directed.  Also check your blood glucose: ? Before and after exercise. ? Before potentially dangerous tasks, like driving or using heavy machinery.  You may need to check your blood glucose more often if: ? Your medicine is being adjusted. ? Your diabetes is not well-controlled. ? You are ill. General tips  Always keep your supplies with you.  If you have questions or need help, all blood glucose meters have a 24-hour "hotline" phone number that you can call. You may also contact your health care provider.  After you use a few boxes of test strips, adjust (calibrate) your blood glucose meter by following instructions that came with your meter. Contact a health care provider if:  Your blood glucose is at or above 240 mg/dL (13.3 mmol/L) for 2 days in a row.  You have been sick or have had a fever for 2 days or longer, and you are not getting better.  You have any of the following problems for more than 6 hours: ? You cannot eat or drink. ? You have nausea or vomiting. ? You have diarrhea. Get help right away if:  Your blood glucose is lower than 54 mg/dL (3 mmol/L).  You become confused or you have trouble thinking clearly.  You have difficulty breathing.  You have moderate or large ketone levels in your urine. Summary  Monitoring your blood sugar (glucose) is an important part of managing your diabetes (diabetes mellitus).  Blood glucose monitoring involves checking your blood glucose as often as directed and keeping a record (log) of your results over time.  Your health care provider will set individualized treatment goals for you. Your goals will be based on your age, other medical conditions you have, and how you respond to diabetes treatment.  Every time you check your blood glucose,  write down your result. Also write down any notes about things that may be affecting your blood glucose, such as your diet and exercise for the day. This information is not intended to replace advice given to you by your health care provider. Make sure you discuss any questions you have with your health care provider. Document Revised: 07/25/2018 Document Reviewed: 03/12/2016 Elsevier Patient Education  [employer] DASH stands for "Dietary Approaches to Stop Hypertension." The DASH eating plan is a healthy eating plan that has been shown to reduce high blood pressure (hypertension). It may also reduce your risk for type 2 diabetes, heart disease, and stroke. The DASH eating plan may also help with weight loss. What are tips for following this plan?  General guidelines  Avoid eating more than 2,300 mg (milligrams) of salt (sodium) a day. If you have hypertension, you may need to reduce your sodium intake to 1,500 mg a day.  Limit alcohol intake to no more than 1 drink a  day for nonpregnant women and 2 drinks a day for men. One drink equals 12 oz of beer, 5 oz of wine, or 1 oz of hard liquor.  Work with your health care provider to maintain a healthy body weight or to lose weight. Ask what an ideal weight is for you.  Get at least 30 minutes of exercise that causes your heart to beat faster (aerobic exercise) most days of the week. Activities may include walking, swimming, or biking.  Work with your health care provider or diet and nutrition specialist (dietitian) to adjust your eating plan to your individual calorie needs. Reading food labels   Check food labels for the amount of sodium per serving. Choose foods with less than 5 percent of the Daily Value of sodium. Generally, foods with less than 300 mg of sodium per serving fit into this eating plan.  To find whole grains, look for the word "whole" as the first word in the ingredient list. Shopping  Buy  products labeled as "low-sodium" or "no salt added."  Buy fresh foods. Avoid canned foods and premade or frozen meals. Cooking  Avoid adding salt when cooking. Use salt-free seasonings or herbs instead of table salt or sea salt. Check with your health care provider or pharmacist before using salt substitutes.  Do not fry foods. Cook foods using healthy methods such as baking, boiling, grilling, and broiling instead.  Cook with heart-healthy oils, such as olive, canola, soybean, or sunflower oil. Meal planning  Eat a balanced diet that includes: ? 5 or more servings of fruits and vegetables each day. At each meal, try to fill half of your plate with fruits and vegetables. ? Up to 6-8 servings of whole grains each day. ? Less than 6 oz of lean meat, poultry, or fish each day. A 3-oz serving of meat is about the same size as a deck of cards. One egg equals 1 oz. ? 2 servings of low-fat dairy each day. ? A serving of nuts, seeds, or beans 5 times each week. ? Heart-healthy fats. Healthy fats called Omega-3 fatty acids are found in foods such as flaxseeds and coldwater fish, like sardines, salmon, and mackerel.  Limit how much you eat of the following: ? Canned or prepackaged foods. ? Food that is high in trans fat, such as fried foods. ? Food that is high in saturated fat, such as fatty meat. ? Sweets, desserts, sugary drinks, and other foods with added sugar. ? Full-fat dairy products.  Do not salt foods before eating.  Try to eat at least 2 vegetarian meals each week.  Eat more home-cooked food and less restaurant, buffet, and fast food.  When eating at a restaurant, ask that your food be prepared with less salt or no salt, if possible. What foods are recommended? The items listed may not be a complete list. Talk with your dietitian about what dietary choices are best for you. Grains Whole-grain or whole-wheat bread. Whole-grain or whole-wheat pasta. Brown rice. [place].  Bulgur. Whole-grain and low-sodium cereals. Pita bread. Low-fat, low-sodium crackers. Whole-wheat flour tortillas. Vegetables Fresh or frozen vegetables (raw, steamed, roasted, or grilled). Low-sodium or reduced-sodium tomato and vegetable juice. Low-sodium or reduced-sodium tomato sauce and tomato paste. Low-sodium or reduced-sodium canned vegetables. Fruits All fresh, dried, or frozen fruit. Canned fruit in natural juice (without added sugar). Meat and other protein foods Skinless chicken or [place]. Ground chicken or [place]. Pork with fat trimmed off. Fish and seafood. Egg whites. Dried beans, peas,  or lentils. Unsalted nuts, nut butters, and seeds. Unsalted canned beans. Lean cuts of beef with fat trimmed off. Low-sodium, lean deli meat. Dairy Low-fat (1%) or fat-free (skim) milk. Fat-free, low-fat, or reduced-fat cheeses. Nonfat, low-sodium ricotta or cottage cheese. Low-fat or nonfat yogurt. Low-fat, low-sodium cheese. Fats and oils Soft margarine without trans fats. Vegetable oil. Low-fat, reduced-fat, or light mayonnaise and salad dressings (reduced-sodium). Canola, safflower, olive, soybean, and sunflower oils. Avocado. Seasoning and other foods Herbs. Spices. Seasoning mixes without salt. Unsalted popcorn and pretzels. Fat-free sweets. What foods are not recommended? The items listed may not be a complete list. Talk with your dietitian about what dietary choices are best for you. Grains Baked goods made with fat, such as croissants, muffins, or some breads. Dry pasta or rice meal packs. Vegetables Creamed or fried vegetables. Vegetables in a cheese sauce. Regular canned vegetables (not low-sodium or reduced-sodium). Regular canned tomato sauce and paste (not low-sodium or reduced-sodium). Regular tomato and vegetable juice (not low-sodium or reduced-sodium). Angie Fava. Olives. Fruits Canned fruit in a light or heavy syrup. Fried fruit. Fruit in cream or butter sauce. Meat and other  protein foods Fatty cuts of meat. Ribs. Fried meat. Berniece Salines. Sausage. Bologna and other processed lunch meats. Salami. Fatback. Hotdogs. Bratwurst. Salted nuts and seeds. Canned beans with added salt. Canned or smoked fish. Whole eggs or egg yolks. Chicken or [place] with skin. Dairy Whole or 2% milk, cream, and half-and-half. Whole or full-fat cream cheese. Whole-fat or sweetened yogurt. Full-fat cheese. Nondairy creamers. Whipped toppings. Processed cheese and cheese spreads. Fats and oils Butter. Stick margarine. Lard. Shortening. Ghee. Bacon fat. Tropical oils, such as coconut, palm kernel, or palm oil. Seasoning and other foods Salted popcorn and pretzels. Onion salt, garlic salt, seasoned salt, table salt, and sea salt. Worcestershire sauce. Tartar sauce. Barbecue sauce. Teriyaki sauce. Soy sauce, including reduced-sodium. Steak sauce. Canned and packaged gravies. Fish sauce. Oyster sauce. Cocktail sauce. Horseradish that you find on the shelf. Ketchup. Mustard. Meat flavorings and tenderizers. Bouillon cubes. Hot sauce and Tabasco sauce. Premade or packaged marinades. Premade or packaged taco seasonings. Relishes. Regular salad dressings. Where to find more information:  National Heart, Lung, and [place]: https://wilson-eaton.com/  American Heart Association: www.heart.org Summary  The DASH eating plan is a healthy eating plan that has been shown to reduce high blood pressure (hypertension). It may also reduce your risk for type 2 diabetes, heart disease, and stroke.  With the DASH eating plan, you should limit salt (sodium) intake to 2,300 mg a day. If you have hypertension, you may need to reduce your sodium intake to 1,500 mg a day.  When on the DASH eating plan, aim to eat more fresh fruits and vegetables, whole grains, lean proteins, low-fat dairy, and heart-healthy fats.  Work with your health care provider or diet and nutrition specialist (dietitian) to adjust your eating plan to your  individual calorie needs. This information is not intended to replace advice given to you by your health care provider. Make sure you discuss any questions you have with your health care provider. Document Revised: 09/13/2017 Document Reviewed: 09/24/2016 Elsevier Patient Education  2020 [employer].

## 2020-07-02 LAB — COMPLETE METABOLIC PANEL WITH GFR
AG Ratio: 1.4 (calc) (ref 1.0–2.5)
ALT: 23 U/L (ref 9–46)
AST: 24 U/L (ref 10–35)
Albumin: 4.1 g/dL (ref 3.6–5.1)
Alkaline phosphatase (APISO): 70 U/L (ref 35–144)
BUN: 9 mg/dL (ref 7–25)
CO2: 29 mmol/L (ref 20–32)
Calcium: 9.1 mg/dL (ref 8.6–10.3)
Chloride: 101 mmol/L (ref 98–110)
Creat: 1.15 mg/dL (ref 0.70–1.33)
GFR, Est African American: 84 mL/min/{1.73_m2} (ref >=60)
GFR, Est Non African American: 72 mL/min/{1.73_m2} (ref >=60)
Globulin: 2.9 g/dL (calc) (ref 1.9–3.7)
Glucose, Bld: 144 mg/dL — ABNORMAL HIGH (ref 65–99)
Potassium: 4.2 mmol/L (ref 3.5–5.3)
Sodium: 140 mmol/L (ref 135–146)
Total Bilirubin: 0.6 mg/dL (ref 0.2–1.2)
Total Protein: 7 g/dL (ref 6.1–8.1)

## 2020-07-02 LAB — LIPID PANEL
Cholesterol: 225 mg/dL — ABNORMAL HIGH (ref <200)
HDL: 41 mg/dL (ref >=40)
LDL Cholesterol (Calc): 158 mg/dL (calc) — ABNORMAL HIGH
Non-HDL Cholesterol (Calc): 184 mg/dL (calc) — ABNORMAL HIGH (ref <130)
Total CHOL/HDL Ratio: 5.5 (calc) — ABNORMAL HIGH (ref <5.0)
Triglycerides: 140 mg/dL (ref <150)

## 2020-07-02 LAB — CBC WITH DIFFERENTIAL/PLATELET
Absolute Monocytes: 479 cells/uL (ref 200–950)
Basophils Absolute: 52 cells/uL (ref 0–200)
Basophils Relative: 0.6 %
Eosinophils Absolute: 200 cells/uL (ref 15–500)
Eosinophils Relative: 2.3 %
HCT: 46.1 % (ref 38.5–50.0)
Hemoglobin: 15.5 g/dL (ref 13.2–17.1)
Lymphs Abs: 2297 cells/uL (ref 850–3900)
MCH: 29.8 pg (ref 27.0–33.0)
MCHC: 33.6 g/dL (ref 32.0–36.0)
MCV: 88.7 fL (ref 80.0–100.0)
MPV: 9.8 fL (ref 7.5–12.5)
Monocytes Relative: 5.5 %
Neutro Abs: 5672 cells/uL (ref 1500–7800)
Neutrophils Relative %: 65.2 %
Platelets: 234 10*3/uL (ref 140–400)
RBC: 5.2 10*6/uL (ref 4.20–5.80)
RDW: 13.4 % (ref 11.0–15.0)
Total Lymphocyte: 26.4 %
WBC: 8.7 10*3/uL (ref 3.8–10.8)

## 2020-07-02 LAB — MICROALBUMIN, URINE: Microalb, Ur: 163.6 mg/dL

## 2020-07-02 LAB — URINALYSIS, ROUTINE W REFLEX MICROSCOPIC
Bacteria, UA: NONE SEEN /HPF
Bilirubin Urine: NEGATIVE
Glucose, UA: NEGATIVE
Hgb urine dipstick: NEGATIVE
Hyaline Cast: NONE SEEN /LPF
Ketones, ur: NEGATIVE
Leukocytes,Ua: NEGATIVE
Nitrite: NEGATIVE
RBC / HPF: NONE SEEN /HPF (ref 0–2)
Specific Gravity, Urine: 1.018 (ref 1.001–1.03)
Squamous Epithelial / HPF: NONE SEEN /HPF (ref <=5)
WBC, UA: NONE SEEN /HPF (ref 0–5)
pH: 5.5 (ref 5.0–8.0)

## 2020-07-02 LAB — HEMOGLOBIN A1C
Hgb A1c MFr Bld: 7.7 % of total Hgb — ABNORMAL HIGH (ref <5.7)
Mean Plasma Glucose: 174 (calc)
eAG (mmol/L): 9.7 (calc)

## 2020-07-02 LAB — BRAIN NATRIURETIC PEPTIDE: Brain Natriuretic Peptide: 65 pg/mL (ref <100)

## 2020-07-14 ENCOUNTER — Encounter: Payer: Self-pay | Admitting: Family Medicine

## 2020-07-14 ENCOUNTER — Telehealth: Payer: Self-pay

## 2020-07-14 MED ORDER — METFORMIN HCL 1000 MG PO TABS: 1000.0000 mg | ORAL_TABLET | Freq: Two times a day (BID) | ORAL | 1 refills | Status: DC

## 2020-07-18 NOTE — Telephone Encounter (Signed)
erro  neous encounter

## 2020-08-01 ENCOUNTER — Ambulatory Visit: Payer: Medicaid Other | Admitting: Family Medicine

## 2020-08-08 ENCOUNTER — Other Ambulatory Visit: Payer: Self-pay

## 2020-08-08 ENCOUNTER — Ambulatory Visit (INDEPENDENT_AMBULATORY_CARE_PROVIDER_SITE_OTHER): Payer: Medicaid Other | Admitting: Family Medicine

## 2020-08-08 VITALS — BP 138/78 | HR 83 | Temp 98.5°F | Resp 18 | Ht 67.0 in | Wt 249.8 lb

## 2020-08-08 DIAGNOSIS — E785 Hyperlipidemia, unspecified: Secondary | ICD-10-CM | POA: Diagnosis not present

## 2020-08-08 DIAGNOSIS — N182 Chronic kidney disease, stage 2 (mild): Secondary | ICD-10-CM | POA: Diagnosis not present

## 2020-08-08 DIAGNOSIS — Z6841 Body Mass Index (BMI) 40.0 and over, adult: Secondary | ICD-10-CM

## 2020-08-08 DIAGNOSIS — R29818 Other symptoms and signs involving the nervous system: Secondary | ICD-10-CM

## 2020-08-08 DIAGNOSIS — E1169 Type 2 diabetes mellitus with other specified complication: Secondary | ICD-10-CM | POA: Diagnosis not present

## 2020-08-08 DIAGNOSIS — R011 Cardiac murmur, unspecified: Secondary | ICD-10-CM | POA: Diagnosis not present

## 2020-08-08 DIAGNOSIS — I10 Essential (primary) hypertension: Secondary | ICD-10-CM | POA: Diagnosis not present

## 2020-08-08 NOTE — Progress Notes (Signed)
Patient ID: Cory Rivas, male    DOB: [birth year], 54 y.o.   MRN: [id number]  PCP: Delsa Grana, PA-C  Chief Complaint  Patient presents with  . Blood Pressure Check    Bp elevated 170/90 this am    Subjective:   Cory Rivas is a 54 y.o. male, presents to clinic with CC of the following:  Here for HTN f/up Was noncompliant with meds at last routine f/up appt a month ago On norvasc 10, lisinopril 40 and crestor 40 but poor compliance and worse diet - eating fast food every day, and heavy smoker He presented here for BP recheck - he smoked just before walking in the door.  His BP was elevated to 170/90, improved to 164/82 with recheck, and then after being here in office and exam room for more than 30 min, BP improved to 138/78 prior to leaving today Pt reports good med compliance and denies any SE.    BP Readings from Last 3 Encounters:  08/08/20 138/78  07/01/20 (!) 176/90  03/24/20 132/78   He has been working on taking meds and eating a healthier diet. He denies CP, chest pressure, SOB, exertional sx, LE edema, palpitation, orthopnea, PND, Ha's, visual disturbances, lightheadedness, hypotension, syncope.  He sleeps poorly due to chronic neck pain He has not done a sleep study, states he snores but doesn't stop breathing - does not want to do sleep study right now.    Patient Active Problem List   Diagnosis Date Noted  . Encounter for screening colonoscopy   . Polyp of ascending colon   . Rectal polyp   . Class 2 severe obesity with serious comorbidity and body mass index (BMI) of 39.0 to 39.9 in adult ([place]) 04/15/2019  . Stress due to family tension 04/15/2019  . Chronic kidney disease (CKD) stage G2/A3, mildly decreased glomerular filtration rate (GFR) between 60-89 mL/min/1.73 square meter and albuminuria creatinine ratio greater than 300 mg/g 04/15/2019  . Proteinuria 04/15/2019  . Hyperlipidemia associated with type 2 diabetes mellitus ([place]) 03/18/2019  .  Diabetes mellitus ([place]) 03/18/2019  . Idiopathic chronic gout of multiple sites without tophus 03/18/2019  . Essential hypertension 03/18/2019  . Dermatitis 03/18/2019  . Lumbar degenerative disc disease 11/22/2015  . Osteoarthritis of spine with radiculopathy, cervical region 11/22/2015  . Osteoarthritis of spine with radiculopathy, lumbar region 11/22/2015      Current Outpatient Medications:  .  allopurinol (ZYLOPRIM) 300 MG tablet, Take 1 tablet (300 mg total) by mouth daily., Disp: 90 tablet, Rfl: 3 .  amLODipine (NORVASC) 10 MG tablet, Take 1 tablet (10 mg total) by mouth daily., Disp: 90 tablet, Rfl: 3 .  DULoxetine (CYMBALTA) 30 MG capsule, Take 1 capsule (30 mg total) by mouth daily., Disp: 90 capsule, Rfl: 3 .  indomethacin (INDOCIN) 25 MG capsule, Take 50 mg PO TID x 2 d at onset of gout flare, then take 25 mg po TID x 3 d, Disp: 20 capsule, Rfl: 3 .  lisinopril (ZESTRIL) 40 MG tablet, Take 1 tablet (40 mg total) by mouth daily., Disp: 90 tablet, Rfl: 3 .  metFORMIN (GLUCOPHAGE) 1000 MG tablet, Take 1 tablet (1,000 mg total) by mouth 2 (two) times daily with a meal., Disp: 180 tablet, Rfl: 1 .  pregabalin (LYRICA) 25 MG capsule, Take 1 capsule (25 mg total) by mouth 2 (two) times daily., Disp: 180 capsule, Rfl: 1 .  rosuvastatin (CRESTOR) 40 MG tablet, Take 1 tablet (40 mg total) by  mouth daily., Disp: 90 tablet, Rfl: 3 .  triamcinolone cream (KENALOG) 0.1 %, Apply 1 application topically 2 (two) times daily., Disp: 30 g, Rfl: 0   No Known Allergies   Social History   Tobacco Use  . Smoking status: Current Every Day Smoker    Packs/day: 1.50    Types: Cigarettes    Start date: 10/19/1989  . Smokeless tobacco: Never Used  Vaping Use  . Vaping Use: Never used  Substance Use Topics  . Alcohol use: No  . Drug use: No      Chart Review Today: I personally reviewed active problem list, medication list, allergies, family history, social history, health maintenance, notes  from last encounter, lab results, imaging with the patient/caregiver today.   Review of Systems 10 Systems reviewed and are negative for acute change except as noted in the HPI.     Objective:   Vitals:   08/08/20 0901 08/08/20 0935  BP: (!) 170/90 (!) 164/82  Pulse: 70 83  Resp:  18  Temp:  98.5 F (36.9 C)  TempSrc:  Oral  SpO2:  99%  Weight:  249 lb 12.8 oz (113.3 kg)  Height:  5\' 7"  (1.702 m)    Body mass index is 39.12 kg/m.  Physical Exam Vitals and nursing note reviewed.  Constitutional:      General: He is not in acute distress.    Appearance: Normal appearance. He is well-developed and well-groomed. He is morbidly obese. He is not ill-appearing, toxic-appearing or diaphoretic.  HENT:     Head: Normocephalic and atraumatic.     Right Ear: External ear normal.     Left Ear: External ear normal.     Mouth/Throat:     Mouth: Mucous membranes are moist.     Pharynx: Oropharynx is clear.  Eyes:     General:        Right eye: No discharge.        Left eye: No discharge.     Conjunctiva/sclera: Conjunctivae normal.  Neck:     Trachea: Phonation normal.  Cardiovascular:     Rate and Rhythm: Normal rate and regular rhythm.  No extrasystoles are present.    Chest Wall: PMI is displaced.     Pulses: Normal pulses.          Radial pulses are 2+ on the right side and 2+ on the left side.       Posterior tibial pulses are 2+ on the right side and 2+ on the left side.     Heart sounds: Heart sounds not distant. Murmur heard.  Systolic murmur is present.  No friction rub. No gallop.   Pulmonary:     Effort: Pulmonary effort is normal. No respiratory distress.     Breath sounds: Normal breath sounds. No stridor. No wheezing or rales.  Musculoskeletal:     Cervical back: Rigidity present. Decreased range of motion.     Right lower leg: No edema.     Left lower leg: No edema.  Skin:    Capillary Refill: Capillary refill takes less than 2 seconds.     Findings: Rash  (no chest and abd) present.  Neurological:     Mental Status: He is alert.  Psychiatric:        Mood and Affect: Mood normal.        Behavior: Behavior normal. Behavior is cooperative.      Results for orders placed or performed in visit on 07/01/20  Microalbumin, urine  Result Value Ref Range   Microalb, Ur 163.6 mg/dL   RAM    Urinalysis, Routine w reflex microscopic  Result Value Ref Range   Color, Urine YELLOW YELLOW   APPearance CLEAR CLEAR   Specific Gravity, Urine 1.018 1.001 - 1.03   pH 5.5 5.0 - 8.0   Glucose, UA NEGATIVE NEGATIVE   Bilirubin Urine NEGATIVE NEGATIVE   Ketones, ur NEGATIVE NEGATIVE   Hgb urine dipstick NEGATIVE NEGATIVE   Protein, ur 3+ (A) NEGATIVE   Nitrite NEGATIVE NEGATIVE   Leukocytes,Ua NEGATIVE NEGATIVE   WBC, UA NONE SEEN 0 - 5 /HPF   RBC / HPF NONE SEEN 0 - 2 /HPF   Squamous Epithelial / LPF NONE SEEN < OR = 5 /HPF   Bacteria, UA NONE SEEN NONE SEEN /HPF   Hyaline Cast NONE SEEN NONE SEEN /LPF  Lipid panel  Result Value Ref Range   Cholesterol 225 (H) <200 mg/dL   HDL 41 > OR = 40 mg/dL   Triglycerides 140 <150 mg/dL   LDL Cholesterol (Calc) 158 (H) mg/dL (calc)   Total CHOL/HDL Ratio 5.5 (H) <5.0 (calc)   Non-HDL Cholesterol (Calc) 184 (H) <130 mg/dL (calc)  Hemoglobin A1C  Result Value Ref Range   Hgb A1c MFr Bld 7.7 (H) <5.7 % of total Hgb   Mean Plasma Glucose 174 (calc)   eAG (mmol/L) 9.7 (calc)  COMPLETE METABOLIC PANEL WITH GFR  Result Value Ref Range   Glucose, Bld 144 (H) 65 - 99 mg/dL   BUN 9 7 - 25 mg/dL   Creat 1.15 0.70 - 1.33 mg/dL   GFR, Est Non African American 72 > OR = 60 mL/min/1.4m2   GFR, Est African American 84 > OR = 60 mL/min/1.34m2   BUN/Creatinine Ratio NOT APPLICABLE 6 - 22 (calc)   Sodium 140 135 - 146 mmol/L   Potassium 4.2 3.5 - 5.3 mmol/L   Chloride 101 98 - 110 mmol/L   CO2 29 20 - 32 mmol/L   Calcium 9.1 8.6 - 10.3 mg/dL   Total Protein 7.0 6.1 - 8.1 g/dL   Albumin 4.1 3.6 - 5.1 g/dL    Globulin 2.9 1.9 - 3.7 g/dL (calc)   AG Ratio 1.4 1.0 - 2.5 (calc)   Total Bilirubin 0.6 0.2 - 1.2 mg/dL   Alkaline phosphatase (APISO) 70 35 - 144 U/L   AST 24 10 - 35 U/L   ALT 23 9 - 46 U/L  CBC with Differential/Platelet  Result Value Ref Range   WBC 8.7 3.8 - 10.8 Thousand/uL   RBC 5.20 4.20 - 5.80 Million/uL   Hemoglobin 15.5 13.2 - 17.1 g/dL   HCT 46.1 38 - 50 %   MCV 88.7 80.0 - 100.0 fL   MCH 29.8 27.0 - 33.0 pg   MCHC 33.6 32.0 - 36.0 g/dL   RDW 13.4 11.0 - 15.0 %   Platelets 234 140 - 400 Thousand/uL   MPV 9.8 7.5 - 12.5 fL   Neutro Abs 5,672 1,500 - 7,800 cells/uL   Lymphs Abs 2,297 850 - 3,900 cells/uL   Absolute Monocytes 479 200 - 950 cells/uL   Eosinophils Absolute 200 15.0 - 500.0 cells/uL   Basophils Absolute 52 0.0 - 200.0 cells/uL   Neutrophils Relative % 65.2 %   Total Lymphocyte 26.4 %   Monocytes Relative 5.5 %   Eosinophils Relative 2.3 %   Basophils Relative 0.6 %  Brain natriuretic peptide  Result Value Ref Range   Brain Natriuretic  Peptide 65 <100 pg/mL   ECG:  Last done 08/01/2019 and 08/11/2019- ER EKG shows TWI in T4-T6 otherwise NRS No past ECHO done per pt and none in chart     Assessment & Plan:     ICD-10-CM   1. Accelerated hypertension  I10 Ambulatory referral to Cardiology   hx of poor med compliance, heavy smoker, morbidly obese, worsening HTN despite increasing meds over past year  2. Systolic murmur  O67.1 Ambulatory referral to Cardiology   systolic, heard best over mitral area  3. Class 3 severe obesity due to excess calories with serious comorbidity and body mass index (BMI) of 40.0 to 44.9 in adult ([facility])  E66.01    Z68.41   4. Chronic kidney disease (CKD) stage G2/A3, mildly decreased glomerular filtration rate (GFR) between 60-89 mL/min/1.73 square meter and albuminuria creatinine ratio greater than 300 mg/g  N18.2    seeing nephrology  5. Type 2 diabetes mellitus with other specified complication, without long-term current  use of insulin ([facility])  E11.69    uncontrolled, continue meds and diet/lifestyle efforts, recheck in 2 months  6. Hyperlipidemia associated with type 2 diabetes mellitus ([place])  E11.69    E78.5    work on diet, exercise and med compliance, due for repeat in Dec  7. Suspected sleep apnea  R29.818    due to obesity and body habitus - explained I would like eval, but pt declined    BP after recheck was much improved - do believe heavy smoking and likely some white coat htn may be driving high BP readings.  He does have hx of poor med compliance and sedentary lifestyle with poor diet - he is working on all of this. Encouraged smoking cessation, DASH diet, working on med compliance. Heard systolic murmur - with gradual increase in HTN over the past 1-1.5 years and murmur - will refer to cardiology for further eval/echo.  Continue lisinopril and amlodipine No smoking before Dr appts Currently no concerning cardiac sx or HF sx  He is due to return for routine f/up in 2 months - for uncontrolled DM and HLD - very poor statin compliance Lab Results  Component Value Date   CHOL 225 (H) 07/01/2020   HDL 41 07/01/2020   LDLCALC 158 (H) 07/01/2020   TRIG 140 07/01/2020   CHOLHDL 5.5 (H) 07/01/2020   If lipids not improved will need to add zetia or see about getting repatha etc?  Discussed plan with pt, instructions printed on AVS   Delsa Grana, PA-C 08/08/20 9:48 AM

## 2020-08-08 NOTE — Progress Notes (Signed)
Patient is here for a blood pressure check. Patient denies chest pain, palpitations, shortness of breath or visual disturbances. At previous visit blood pressure was 176/90 with a heart rate of 71. Today during nurse visit first check blood pressure was 170/90 with a heart reate of 70. He does take blood pressure medications as prescribed with no missed doses.

## 2020-08-08 NOTE — Patient Instructions (Signed)
DASH Eating Plan DASH stands for "Dietary Approaches to Stop Hypertension." The DASH eating plan is a healthy eating plan that has been shown to reduce high blood pressure (hypertension). It may also reduce your risk for type 2 diabetes, heart disease, and stroke. The DASH eating plan may also help with weight loss. What are tips for following this plan?  General guidelines  Avoid eating more than 2,300 mg (milligrams) of salt (sodium) a day. If you have hypertension, you may need to reduce your sodium intake to 1,500 mg a day.  Limit alcohol intake to no more than 1 drink a day for nonpregnant women and 2 drinks a day for men. One drink equals 12 oz of beer, 5 oz of wine, or 1 oz of hard liquor.  Work with your health care provider to maintain a healthy body weight or to lose weight. Ask what an ideal weight is for you.  Get at least 30 minutes of exercise that causes your heart to beat faster (aerobic exercise) most days of the week. Activities may include walking, swimming, or biking.  Work with your health care provider or diet and nutrition specialist (dietitian) to adjust your eating plan to your individual calorie needs. Reading food labels   Check food labels for the amount of sodium per serving. Choose foods with less than 5 percent of the Daily Value of sodium. Generally, foods with less than 300 mg of sodium per serving fit into this eating plan.  To find whole grains, look for the word "whole" as the first word in the ingredient list. Shopping  Buy products labeled as "low-sodium" or "no salt added."  Buy fresh foods. Avoid canned foods and premade or frozen meals. Cooking  Avoid adding salt when cooking. Use salt-free seasonings or herbs instead of table salt or sea salt. Check with your health care provider or pharmacist before using salt substitutes.  Do not fry foods. Cook foods using healthy methods such as baking, boiling, grilling, and broiling instead.  Cook with  heart-healthy oils, such as olive, canola, soybean, or sunflower oil. Meal planning  Eat a balanced diet that includes: ? 5 or more servings of fruits and vegetables each day. At each meal, try to fill half of your plate with fruits and vegetables. ? Up to 6-8 servings of whole grains each day. ? Less than 6 oz of lean meat, poultry, or fish each day. A 3-oz serving of meat is about the same size as a deck of cards. One egg equals 1 oz. ? 2 servings of low-fat dairy each day. ? A serving of nuts, seeds, or beans 5 times each week. ? Heart-healthy fats. Healthy fats called Omega-3 fatty acids are found in foods such as flaxseeds and coldwater fish, like sardines, salmon, and mackerel.  Limit how much you eat of the following: ? Canned or prepackaged foods. ? Food that is high in trans fat, such as fried foods. ? Food that is high in saturated fat, such as fatty meat. ? Sweets, desserts, sugary drinks, and other foods with added sugar. ? Full-fat dairy products.  Do not salt foods before eating.  Try to eat at least 2 vegetarian meals each week.  Eat more home-cooked food and less restaurant, buffet, and fast food.  When eating at a restaurant, ask that your food be prepared with less salt or no salt, if possible. What foods are recommended? The items listed may not be a complete list. Talk with your dietitian about   what dietary choices are best for you. Grains Whole-grain or whole-wheat bread. Whole-grain or whole-wheat pasta. Brown rice. Oatmeal. Quinoa. Bulgur. Whole-grain and low-sodium cereals. Pita bread. Low-fat, low-sodium crackers. Whole-wheat flour tortillas. Vegetables Fresh or frozen vegetables (raw, steamed, roasted, or grilled). Low-sodium or reduced-sodium tomato and vegetable juice. Low-sodium or reduced-sodium tomato sauce and tomato paste. Low-sodium or reduced-sodium canned vegetables. Fruits All fresh, dried, or frozen fruit. Canned fruit in natural juice (without  added sugar). Meat and other protein foods Skinless chicken or turkey. Ground chicken or turkey. Pork with fat trimmed off. Fish and seafood. Egg whites. Dried beans, peas, or lentils. Unsalted nuts, nut butters, and seeds. Unsalted canned beans. Lean cuts of beef with fat trimmed off. Low-sodium, lean deli meat. Dairy Low-fat (1%) or fat-free (skim) milk. Fat-free, low-fat, or reduced-fat cheeses. Nonfat, low-sodium ricotta or cottage cheese. Low-fat or nonfat yogurt. Low-fat, low-sodium cheese. Fats and oils Soft margarine without trans fats. Vegetable oil. Low-fat, reduced-fat, or light mayonnaise and salad dressings (reduced-sodium). Canola, safflower, olive, soybean, and sunflower oils. Avocado. Seasoning and other foods Herbs. Spices. Seasoning mixes without salt. Unsalted popcorn and pretzels. Fat-free sweets. What foods are not recommended? The items listed may not be a complete list. Talk with your dietitian about what dietary choices are best for you. Grains Baked goods made with fat, such as croissants, muffins, or some breads. Dry pasta or rice meal packs. Vegetables Creamed or fried vegetables. Vegetables in a cheese sauce. Regular canned vegetables (not low-sodium or reduced-sodium). Regular canned tomato sauce and paste (not low-sodium or reduced-sodium). Regular tomato and vegetable juice (not low-sodium or reduced-sodium). Pickles. Olives. Fruits Canned fruit in a light or heavy syrup. Fried fruit. Fruit in cream or butter sauce. Meat and other protein foods Fatty cuts of meat. Ribs. Fried meat. Bacon. Sausage. Bologna and other processed lunch meats. Salami. Fatback. Hotdogs. Bratwurst. Salted nuts and seeds. Canned beans with added salt. Canned or smoked fish. Whole eggs or egg yolks. Chicken or turkey with skin. Dairy Whole or 2% milk, cream, and half-and-half. Whole or full-fat cream cheese. Whole-fat or sweetened yogurt. Full-fat cheese. Nondairy creamers. Whipped toppings.  Processed cheese and cheese spreads. Fats and oils Butter. Stick margarine. Lard. Shortening. Ghee. Bacon fat. Tropical oils, such as coconut, palm kernel, or palm oil. Seasoning and other foods Salted popcorn and pretzels. Onion salt, garlic salt, seasoned salt, table salt, and sea salt. Worcestershire sauce. Tartar sauce. Barbecue sauce. Teriyaki sauce. Soy sauce, including reduced-sodium. Steak sauce. Canned and packaged gravies. Fish sauce. Oyster sauce. Cocktail sauce. Horseradish that you find on the shelf. Ketchup. Mustard. Meat flavorings and tenderizers. Bouillon cubes. Hot sauce and Tabasco sauce. Premade or packaged marinades. Premade or packaged taco seasonings. Relishes. Regular salad dressings. Where to find more information:  National Heart, Lung, and Blood Institute: www.nhlbi.nih.gov  American Heart Association: www.heart.org Summary  The DASH eating plan is a healthy eating plan that has been shown to reduce high blood pressure (hypertension). It may also reduce your risk for type 2 diabetes, heart disease, and stroke.  With the DASH eating plan, you should limit salt (sodium) intake to 2,300 mg a day. If you have hypertension, you may need to reduce your sodium intake to 1,500 mg a day.  When on the DASH eating plan, aim to eat more fresh fruits and vegetables, whole grains, lean proteins, low-fat dairy, and heart-healthy fats.  Work with your health care provider or diet and nutrition specialist (dietitian) to adjust your eating plan to your   individual calorie needs. This information is not intended to replace advice given to you by your health care provider. Make sure you discuss any questions you have with your health care provider. Document Revised: 09/13/2017 Document Reviewed: 09/24/2016 Elsevier Patient Education  2020 Elsevier Inc.  

## 2020-08-24 ENCOUNTER — Encounter: Payer: Self-pay | Admitting: Family Medicine

## 2020-09-19 ENCOUNTER — Encounter: Payer: Self-pay | Admitting: Internal Medicine

## 2020-09-19 ENCOUNTER — Ambulatory Visit: Payer: Medicaid Other | Admitting: Internal Medicine

## 2020-09-19 NOTE — Progress Notes (Deleted)
New Outpatient Visit Date: 09/19/2020  Referring Provider: Delsa Grana, PA-C [id number] [place],  [place] [address]  Chief Complaint: ***  HPI:  Cory Rivas is a 54 y.o. male who is being seen today for the evaluation of uncontrolled hypertension and heart murmur at the request of Cory Rivas. He has a history of hypertension, hyperlipidemia, type 2 diabetes mellitus, and morbid obesity. ***  --------------------------------------------------------------------------------------------------  Cardiovascular History & Procedures: Cardiovascular Problems:  Uncontrolled hypertension  Heart murmur  Risk Factors:  Hypertension, hyperlipidemia, diabetes mellitus, male gender, tobacco use, and morbid obesity  Cath/PCI:  None  CV Surgery:  None  EP Procedures and Devices:  None  Non-Invasive Evaluation(s):  None  Recent CV Pertinent Labs: Lab Results  Component Value Date   CHOL 225 (H) 07/01/2020   HDL 41 07/01/2020   LDLCALC 158 (H) 07/01/2020   TRIG 140 07/01/2020   CHOLHDL 5.5 (H) 07/01/2020   BNP 65 07/01/2020   K 4.2 07/01/2020   BUN 9 07/01/2020   BUN 13 12/18/2017   CREATININE 1.15 07/01/2020    --------------------------------------------------------------------------------------------------  Past Medical History:  Diagnosis Date  . Bulging of cervical intervertebral disc   . Diabetes mellitus without complication ([place])   . Gout   . High cholesterol   . Stress due to family tension 04/15/2019    Past Surgical History:  Procedure Laterality Date  . CHOLECYSTECTOMY    . COLONOSCOPY WITH PROPOFOL N/A 03/10/2020   Procedure: COLONOSCOPY WITH PROPOFOL;  Surgeon: Lucilla Lame, MD;  Location: [facility] END[facility];  Service: Endoscopy;  Laterality: N/A;    No outpatient medications have been marked as taking for the 09/19/20 encounter (Appointment) with Cory Rivas, Cory Gave, MD.    Allergies: Patient has no known allergies.  Social History    Tobacco Use  . Smoking status: Current Every Day Smoker    Packs/day: 1.50    Types: Cigarettes    Start date: 10/19/1989  . Smokeless tobacco: Never Used  Vaping Use  . Vaping Use: Never used  Substance Use Topics  . Alcohol use: No  . Drug use: No    Family History  Problem Relation Age of Onset  . Heart disease Mother   . Diabetes Mother   . Hyperlipidemia Mother   . Hypertension Mother   . Lung cancer Father   . Hypertension Father   . Stroke Father   . AAA (abdominal aortic aneurysm) Maternal Grandmother   . Heart attack Maternal Grandmother   . Diabetes Maternal Grandfather     Review of Systems: A 12-system review of systems was performed and was negative except as noted in the HPI.  --------------------------------------------------------------------------------------------------  Physical Exam: There were no vitals taken for this visit.  General:  *** HEENT: No conjunctival pallor or scleral icterus. Facemask in place. Neck: Supple without lymphadenopathy, thyromegaly, JVD, or HJR. No carotid bruit. Lungs: Normal work of breathing. Clear to auscultation bilaterally without wheezes or crackles. Heart: Regular rate and rhythm without murmurs, rubs, or gallops. Non-displaced PMI. Abd: Bowel sounds present. Soft, NT/ND without hepatosplenomegaly Ext: No lower extremity edema. Radial, PT, and DP pulses are 2+ bilaterally Skin: Warm and dry without rash. Neuro: CNIII-XII intact. Strength and fine-touch sensation intact in upper and lower extremities bilaterally. Psych: Normal mood and affect.  EKG:  ***  Lab Results  Component Value Date   WBC 8.7 07/01/2020   HGB 15.5 07/01/2020   HCT 46.1 07/01/2020   MCV 88.7 07/01/2020   PLT 234 07/01/2020  Lab Results  Component Value Date   NA 140 07/01/2020   K 4.2 07/01/2020   CL 101 07/01/2020   CO2 29 07/01/2020   BUN 9 07/01/2020   CREATININE 1.15 07/01/2020   GLUCOSE 144 (H) 07/01/2020   ALT 23  07/01/2020    Lab Results  Component Value Date   CHOL 225 (H) 07/01/2020   HDL 41 07/01/2020   LDLCALC 158 (H) 07/01/2020   TRIG 140 07/01/2020   CHOLHDL 5.5 (H) 07/01/2020     --------------------------------------------------------------------------------------------------  ASSESSMENT AND PLAN: Cory Bush, MD 09/19/2020 7:25 AM

## 2020-09-26 ENCOUNTER — Emergency Department: Payer: Medicaid Other

## 2020-09-26 ENCOUNTER — Emergency Department
Admission: EM | Admit: 2020-09-26 | Discharge: 2020-09-26 | Disposition: A | Discharge status: Home / Self Care | Payer: Medicaid Other | Attending: Emergency Medicine | Admitting: Emergency Medicine

## 2020-09-26 ENCOUNTER — Other Ambulatory Visit: Payer: Self-pay

## 2020-09-26 DIAGNOSIS — Z20822 Contact with and (suspected) exposure to covid-19: Secondary | ICD-10-CM | POA: Insufficient documentation

## 2020-09-26 DIAGNOSIS — M5412 Radiculopathy, cervical region: Secondary | ICD-10-CM | POA: Insufficient documentation

## 2020-09-26 DIAGNOSIS — W19XXXA Unspecified fall, initial encounter: Secondary | ICD-10-CM

## 2020-09-26 DIAGNOSIS — R202 Paresthesia of skin: Secondary | ICD-10-CM | POA: Diagnosis not present

## 2020-09-26 DIAGNOSIS — M50322 Other cervical disc degeneration at C5-C6 level: Secondary | ICD-10-CM | POA: Diagnosis not present

## 2020-09-26 DIAGNOSIS — N186 End stage renal disease: Secondary | ICD-10-CM | POA: Diagnosis not present

## 2020-09-26 DIAGNOSIS — F17210 Nicotine dependence, cigarettes, uncomplicated: Secondary | ICD-10-CM | POA: Insufficient documentation

## 2020-09-26 DIAGNOSIS — M50323 Other cervical disc degeneration at C6-C7 level: Secondary | ICD-10-CM | POA: Diagnosis not present

## 2020-09-26 DIAGNOSIS — M542 Cervicalgia: Secondary | ICD-10-CM | POA: Diagnosis not present

## 2020-09-26 DIAGNOSIS — W01198A Fall on same level from slipping, tripping and stumbling with subsequent striking against other object, initial encounter: Secondary | ICD-10-CM | POA: Diagnosis not present

## 2020-09-26 DIAGNOSIS — Z79899 Other long term (current) drug therapy: Secondary | ICD-10-CM | POA: Insufficient documentation

## 2020-09-26 DIAGNOSIS — M4802 Spinal stenosis, cervical region: Secondary | ICD-10-CM | POA: Insufficient documentation

## 2020-09-26 DIAGNOSIS — Z7984 Long term (current) use of oral hypoglycemic drugs: Secondary | ICD-10-CM | POA: Insufficient documentation

## 2020-09-26 DIAGNOSIS — Z86010 Personal history of colonic polyps: Secondary | ICD-10-CM | POA: Insufficient documentation

## 2020-09-26 DIAGNOSIS — E785 Hyperlipidemia, unspecified: Secondary | ICD-10-CM | POA: Insufficient documentation

## 2020-09-26 DIAGNOSIS — I120 Hypertensive chronic kidney disease with stage 5 chronic kidney disease or end stage renal disease: Secondary | ICD-10-CM | POA: Insufficient documentation

## 2020-09-26 DIAGNOSIS — E1169 Type 2 diabetes mellitus with other specified complication: Secondary | ICD-10-CM | POA: Diagnosis not present

## 2020-09-26 DIAGNOSIS — E1122 Type 2 diabetes mellitus with diabetic chronic kidney disease: Secondary | ICD-10-CM | POA: Insufficient documentation

## 2020-09-26 DIAGNOSIS — R200 Anesthesia of skin: Secondary | ICD-10-CM | POA: Diagnosis not present

## 2020-09-26 LAB — RESP PANEL BY RT-PCR (FLU A&B, COVID) ARPGX2
Influenza A by PCR: NEGATIVE
Influenza B by PCR: NEGATIVE
SARS Coronavirus 2 by RT PCR: NEGATIVE

## 2020-09-26 MED ORDER — METHOCARBAMOL 500 MG PO TABS
1000.0000 mg | ORAL_TABLET | Freq: Once | ORAL | Status: AC
  Administered 2020-09-26: 1000 mg via ORAL
  Filled 2020-09-26: qty 2

## 2020-09-26 MED ORDER — PREDNISONE 10 MG PO TABS: 10.0000 mg | ORAL_TABLET | Freq: Every day | ORAL | 0 refills | Status: DC

## 2020-09-26 MED ORDER — METHOCARBAMOL 500 MG PO TABS: 500.0000 mg | ORAL_TABLET | Freq: Four times a day (QID) | ORAL | 0 refills | Status: DC

## 2020-09-26 MED ORDER — MELOXICAM 7.5 MG PO TABS
15.0000 mg | ORAL_TABLET | Freq: Once | ORAL | Status: AC
  Administered 2020-09-26: 15 mg via ORAL
  Filled 2020-09-26: qty 2

## 2020-09-26 MED ORDER — ONDANSETRON 8 MG PO TBDP
8.0000 mg | ORAL_TABLET | Freq: Once | ORAL | Status: AC
  Administered 2020-09-26: 8 mg via ORAL
  Filled 2020-09-26: qty 1

## 2020-09-26 MED ORDER — OXYCODONE-ACETAMINOPHEN 5-325 MG PO TABS
1.0000 | ORAL_TABLET | Freq: Once | ORAL | Status: AC
  Administered 2020-09-26: 1 via ORAL
  Filled 2020-09-26: qty 1

## 2020-09-26 MED ORDER — PREDNISONE 20 MG PO TABS
60.0000 mg | ORAL_TABLET | Freq: Once | ORAL | Status: AC
  Administered 2020-09-26: 60 mg via ORAL
  Filled 2020-09-26: qty 3

## 2020-09-26 MED ORDER — MELOXICAM 15 MG PO TABS: 15.0000 mg | ORAL_TABLET | Freq: Every day | ORAL | 0 refills | Status: DC

## 2020-09-26 MED ORDER — OXYCODONE-ACETAMINOPHEN 5-325 MG PO TABS: 1.0000 | ORAL_TABLET | Freq: Four times a day (QID) | ORAL | 0 refills | Status: DC | PRN

## 2020-09-26 NOTE — ED Triage Notes (Signed)
Pt states he slipped and fell on some leaves a couple of days ago and has been having neck pain radiating into the left arm. States he was suppose to have cervical surgery a couple years ago prior to covid but never did.

## 2020-09-26 NOTE — ED Provider Notes (Signed)
[facility] Emergency Department Provider Note  ____________________________________________  Time seen: Approximately 5:34 PM  I have reviewed the triage vital signs and the nursing notes.   HISTORY  Chief Complaint Neck Pain    HPI Cory Rivas is a 54 y.o. male who presents the emergency department complaining of increased neck pain, numbness and tingling of the left arm to the level of the elbow, loss of sensation from the elbow to the left hand with weakness of the left hand.  Patient states that he has a history of multiple ruptured disc from a previous injury.  Patient states that he had a neurosurgeon, was scheduled for surgery and then the Covid pandemic arrived.  Patient states that his surgery was pushed multiple times and he eventually did not actually have his surgery as his surgeon transferred to [facility] for another job.  Patient states that several days ago he was working outside, slipped on some wet leaves and fell landing on the left shoulder and neck.  Since then he has had significantly increased pain to the left side of the neck, left shoulder.  He has numbness and tingling as well as a shock type sensation running from his neck to the level of the elbow.  Distal to the elbow patient has a loss of sensation and states that he is having weakness of the left hand.  He states that he is dropping items that he is trying to pick up with the left hand.  He denies any headache, visual changes.  No chest pain or shortness of breath.  No other complaints at this time.         Past Medical History:  Diagnosis Date  . Bulging of cervical intervertebral disc   . Diabetes mellitus without complication ([place])   . Gout   . High cholesterol   . Hypertension   . Stress due to family tension 04/15/2019    Patient Active Problem List   Diagnosis Date Noted  . Polyp of ascending colon   . Rectal polyp   . Class 2 severe obesity with serious comorbidity and  body mass index (BMI) of 39.0 to 39.9 in adult ([place]) 04/15/2019  . Chronic kidney disease (CKD) stage G2/A3, mildly decreased glomerular filtration rate (GFR) between 60-89 mL/min/1.73 square meter and albuminuria creatinine ratio greater than 300 mg/g 04/15/2019  . Proteinuria 04/15/2019  . Hyperlipidemia associated with type 2 diabetes mellitus ([place]) 03/18/2019  . Diabetes mellitus ([place]) 03/18/2019  . Idiopathic chronic gout of multiple sites without tophus 03/18/2019  . Essential hypertension 03/18/2019  . Dermatitis 03/18/2019  . Lumbar degenerative disc disease 11/22/2015  . Osteoarthritis of spine with radiculopathy, cervical region 11/22/2015  . Osteoarthritis of spine with radiculopathy, lumbar region 11/22/2015    Past Surgical History:  Procedure Laterality Date  . CHOLECYSTECTOMY    . COLONOSCOPY WITH PROPOFOL N/A 03/10/2020   Procedure: COLONOSCOPY WITH PROPOFOL;  Surgeon: Lucilla Lame, MD;  Location: [facility] ENDOSCOPY;  Service: Endoscopy;  Laterality: N/A;    Prior to Admission medications   Medication Sig Start Date End Date Taking? Authorizing Provider  allopurinol (ZYLOPRIM) 300 MG tablet Take 1 tablet (300 mg total) by mouth daily. 03/24/20   Delsa Grana, PA-C  amLODipine (NORVASC) 10 MG tablet Take 1 tablet (10 mg total) by mouth daily. 03/01/20   Delsa Grana, PA-C  DULoxetine (CYMBALTA) 30 MG capsule Take 1 capsule (30 mg total) by mouth daily. 03/01/20   Delsa Grana, PA-C  indomethacin (INDOCIN) 25  MG capsule Take 50 mg PO TID x 2 d at onset of gout flare, then take 25 mg po TID x 3 d 03/24/20   Delsa Grana, PA-C  lisinopril (ZESTRIL) 40 MG tablet Take 1 tablet (40 mg total) by mouth daily. 03/01/20   Delsa Grana, PA-C  meloxicam (MOBIC) 15 MG tablet Take 1 tablet (15 mg total) by mouth daily. 09/26/20   Lennie Dunnigan, Charline Bills, PA-C  metFORMIN (GLUCOPHAGE) 1000 MG tablet Take 1 tablet (1,000 mg total) by mouth 2 (two) times daily with a meal. 07/14/20   Delsa Grana, PA-C   methocarbamol (ROBAXIN) 500 MG tablet Take 1 tablet (500 mg total) by mouth 4 (four) times daily. 09/26/20   Baker Moronta, Charline Bills, PA-C  oxyCODONE-acetaminophen (PERCOCET/ROXICET) 5-325 MG tablet Take 1 tablet by mouth every 6 (six) hours as needed for severe pain. 09/26/20   Marjorie Deprey, Charline Bills, PA-C  predniSONE (DELTASONE) 10 MG tablet Take 1 tablet (10 mg total) by mouth daily. 09/26/20   Skyanne Welle, Charline Bills, PA-C  pregabalin (LYRICA) 25 MG capsule Take 1 capsule (25 mg total) by mouth 2 (two) times daily. 03/01/20   Delsa Grana, PA-C  rosuvastatin (CRESTOR) 40 MG tablet Take 1 tablet (40 mg total) by mouth daily. 03/01/20   Delsa Grana, PA-C  triamcinolone cream (KENALOG) 0.1 % Apply 1 application topically 2 (two) times daily. 03/24/20   Delsa Grana, PA-C    Allergies Patient has no known allergies.  Family History  Problem Relation Age of Onset  . Heart disease Mother   . Diabetes Mother   . Hyperlipidemia Mother   . Hypertension Mother   . Lung cancer Father   . Hypertension Father   . Stroke Father   . AAA (abdominal aortic aneurysm) Maternal Grandmother   . Heart attack Maternal Grandmother   . Diabetes Maternal Grandfather     Social History Social History   Tobacco Use  . Smoking status: Current Every Day Smoker    Packs/day: 1.50    Types: Cigarettes    Start date: 10/19/1989  . Smokeless tobacco: Never Used  Vaping Use  . Vaping Use: Never used  Substance Use Topics  . Alcohol use: No  . Drug use: No     Review of Systems  Constitutional: No fever/chills Eyes: No visual changes. No discharge ENT: No upper respiratory complaints. Cardiovascular: no chest pain. Respiratory: no cough. No SOB. Gastrointestinal: No abdominal pain.  No nausea, no vomiting.   Musculoskeletal: Positive for increased neck pain, numbness and tingling of the left arm, weakness of the left arm Skin: Negative for rash, abrasions, lacerations, ecchymosis. Neurological: Negative  for headaches, focal weakness or numbness.  10 System ROS otherwise negative.  ____________________________________________   PHYSICAL EXAM:  VITAL SIGNS: ED Triage Vitals  Enc Vitals Group     BP 09/26/20 1430 (!) 175/88     Pulse Rate 09/26/20 1430 71     Resp 09/26/20 1430 18     Temp 09/26/20 1430 98.9 F (37.2 C)     Temp Source 09/26/20 1430 Oral     SpO2 09/26/20 1430 97 %     Weight 09/26/20 1431 250 lb (113.4 kg)     Height 09/26/20 1431 5\' 6"  (1.676 m)     Head Circumference --      Peak Flow --      Pain Score 09/26/20 1431 9     Pain Loc --      Pain Edu? --  Excl. in [place]? --      Constitutional: Alert and oriented. Well appearing and in no acute distress. Eyes: Conjunctivae are normal. PERRL. EOMI. Head: Atraumatic. ENT:      Ears:       Nose: No congestion/rhinnorhea.      Mouth/Throat: Mucous membranes are moist.  Neck: No stridor.  Positive for midline and left paraspinal cervical spine tenderness to palpation.  No palpable abnormality or step-off.  This tenderness extends into the posterior shoulder.  See below musculoskeletal exam.  Cardiovascular: Normal rate, regular rhythm. Normal S1 and S2.  Good peripheral circulation. Respiratory: Normal respiratory effort without tachypnea or retractions. Lungs CTAB. Good air entry to the bases with no decreased or absent breath sounds. Musculoskeletal: Full range of motion to all extremities. No gross deformities appreciated.  Visualization of the left shoulder, left upper extremity reveals no edema, ecchymosis, erythema.  Limited range of motion of the shoulder due to pain.  Patient has good sensation through the shoulder, to the level of the elbow.  There is decreased sensation from the elbow to the hand when compared with unaffected extremity.  Patient is still able to feel sensation but states that it is drastically decreased.  Full range of motion to the digits but when comparing strength, patient has  significant decrease in strength to the left upper extremity when compared with right. Neurologic:  Normal speech and language. No gross focal neurologic deficits are appreciated.  Cranial nerves II through XII grossly intact. Skin:  Skin is warm, dry and intact. No rash noted. Psychiatric: Mood and affect are normal. Speech and behavior are normal. Patient exhibits appropriate insight and judgement.   ____________________________________________   LABS (all labs ordered are listed, but only abnormal results are displayed)  Labs Reviewed  RESP PANEL BY RT-PCR (FLU A&B, COVID) [id number]   ____________________________________________  EKG   ____________________________________________  RADIOLOGY I personally viewed and evaluated these images as part of my medical decision making, as well as reviewing the written report by the radiologist.  ED Provider Interpretation: No acute fractures identified on cervical spine x-ray.  Patient has severe spinal canal stenosis from C2-C4 as well as moderate stenosis through C5.  DG Cervical Spine 2-3 Views  Result Date: 09/26/2020 CLINICAL DATA:  HX ruptured disc at multiple levels, recent fall, pain, numbness and tingling L arm. Loss of sensation to hand EXAM: CERVICAL SPINE - 2-3 VIEW COMPARISON:  None. FINDINGS: Straightening of normal lordosis. No listhesis. Vertebral body heights are preserved. There is no evidence of fracture. Disc space narrowing with endplate spurring at I9-S8, C6-C7, and to a lesser extent C4-C5. No prevertebral soft tissue edema. Lateral masses of C1 well aligned on C2. Lung apices are clear. IMPRESSION: 1. No acute cervical spine fracture or listhesis. 2. Multilevel degenerative disc disease most prominent at C5-C6 and C6-C7. Electronically Signed   By: Keith Rake M.D.   On: 09/26/2020 18:24   MR Cervical Spine Wo Contrast  Result Date: 09/26/2020 CLINICAL DATA:  Recent fall with increased neck pain and numbness and  tingling in the upper extremities. EXAM: MRI CERVICAL SPINE WITHOUT CONTRAST TECHNIQUE: Multiplanar, multisequence MR imaging of the cervical spine was performed. No intravenous contrast was administered. COMPARISON:  None. FINDINGS: Alignment: Physiologic. Vertebrae: No fracture, evidence of discitis, or bone lesion. Cord: Normal signal and morphology. Posterior Fossa, vertebral arteries, paraspinal tissues: Negative. Disc levels: C1-2: Unremarkable. 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 Severe spinal canal stenosis. No neural foraminal stenosis. 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 Severe spinal canal stenosis. Moderate right and mild left neural foraminal stenosis. 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 Mild spinal canal stenosis. Mild right and moderate left neural foraminal stenosis. 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 Moderate spinal canal stenosis. Severe right neural foraminal stenosis. C6-7: Small disc bulge with uncovertebral spurring. There is no spinal canal stenosis. Moderate right neural foraminal stenosis. C7-T1: Normal disc space and facet joints. There is no spinal canal stenosis. No neural foraminal stenosis. IMPRESSION: 1. 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 CT would be helpful to better assess the degree of ossification. 2. Multilevel moderate-to-severe neural foraminal stenosis, worst at right C5-6. Electronically Signed   By: Ulyses Jarred M.D.   On: 09/26/2020 19:54    ____________________________________________    PROCEDURES  Procedure(s) performed:    Procedures    Medications  predniSONE (DELTASONE) tablet 60 mg (has no administration in time range)  oxyCODONE-acetaminophen  (PERCOCET/ROXICET) 5-325 MG per tablet 1 tablet (has no administration in time range)  meloxicam (MOBIC) tablet 15 mg (has no administration in time range)  methocarbamol (ROBAXIN) tablet 1,000 mg (has no administration in time range)  oxyCODONE-acetaminophen (PERCOCET/ROXICET) 5-325 MG per tablet 1 tablet (1 tablet Oral Given 09/26/20 1756)  ondansetron (ZOFRAN-ODT) disintegrating tablet 8 mg (8 mg Oral Given 09/26/20 1757)  methocarbamol (ROBAXIN) tablet 1,000 mg (1,000 mg Oral Given 09/26/20 1756)     ____________________________________________   INITIAL IMPRESSION / ASSESSMENT AND PLAN / ED COURSE  Pertinent labs & imaging results that were available during my care of the patient were reviewed by me and considered in my medical decision making (see chart for details).  Review of the [place] CSRS was performed in accordance of the [place] prior to dispensing any controlled drugs.           Patient's diagnosis is consistent with spinal stenosis in the cervical spine, fall and cervical radiculopathy.  Patient has multilevel degenerative changes throughout his cervical spine due to a fall many years ago.  Patient was scheduled for surgery at [facility] prior to the COVID-19 pandemic.  The pandemic pushed the surgery multiple times and his surgeon eventually left from [place] prior to performing patient surgery.  Patient had a mechanical fall several days ago, has had increased symptoms to the neck and left arm.  Given the symptoms I evaluated the patient with both x-ray and MRI.  Patient has severe stenosis of the cervical spine between C2 and C4, moderate stenosis from C3-4 through C6.  Multiple areas of foraminal outlet stenosis as well.  I discussed the patient with on-call neurosurgeon, Dr. Lacinda Axon who advises that the patient would be appropriate for follow-up outpatient for surgical planning.  Patient did receive enough relief from oral meds that I feel comfortable letting the patient be discharged for  outpatient follow-up.  Strict return cautions are discussed with the patient.  Avoid any high risk activity that could lead to further falls or injury.  Patient will follow up with neurosurgery.  I will prescribe steroid taper, anti-inflammatory, muscle relaxer and pain medication for the patient symptoms.  Patient is given ED precautions to return to the ED for any worsening or new symptoms.     ____________________________________________  FINAL CLINICAL IMPRESSION(S) / ED DIAGNOSES  Final diagnoses:  Cervical stenosis of spinal canal  Fall,  initial encounter  Cervical radiculopathy      NEW MEDICATIONS STARTED DURING THIS VISIT:  ED Discharge Orders         Ordered    methocarbamol (ROBAXIN) 500 MG tablet  4 times daily        09/26/20 2138    predniSONE (DELTASONE) 10 MG tablet  Daily       Note to Pharmacy: Take 6 pills x 2 days, 5 pills x 2 days, 4 pills x 2 days, 3 pills x 2 days, 2 pills x 2 days, and 1 pill x 2 days   09/26/20 2138    oxyCODONE-acetaminophen (PERCOCET/ROXICET) 5-325 MG tablet  Every 6 hours PRN        09/26/20 2138    meloxicam (MOBIC) 15 MG tablet  Daily        09/26/20 2140              This chart was dictated using voice recognition software/Dragon. Despite best efforts to proofread, errors can occur which can change the meaning. Any change was purely unintentional.    Darletta Moll, PA-C 09/26/20 2143    Nance Pear, MD 09/26/20 2151

## 2020-09-29 ENCOUNTER — Ambulatory Visit: Payer: Self-pay | Admitting: Family Medicine

## 2020-09-30 ENCOUNTER — Ambulatory Visit: Payer: Medicaid Other | Admitting: Family Medicine

## 2020-09-30 ENCOUNTER — Encounter: Payer: Self-pay | Admitting: Family Medicine

## 2020-09-30 ENCOUNTER — Other Ambulatory Visit: Payer: Self-pay

## 2020-09-30 VITALS — BP 128/74 | HR 75 | Temp 98.0°F | Resp 18 | Ht 67.0 in | Wt 249.3 lb

## 2020-09-30 DIAGNOSIS — M1A09X0 Idiopathic chronic gout, multiple sites, without tophus (tophi): Secondary | ICD-10-CM | POA: Diagnosis not present

## 2020-09-30 DIAGNOSIS — Z6841 Body Mass Index (BMI) 40.0 and over, adult: Secondary | ICD-10-CM | POA: Diagnosis not present

## 2020-09-30 DIAGNOSIS — E785 Hyperlipidemia, unspecified: Secondary | ICD-10-CM

## 2020-09-30 DIAGNOSIS — N182 Chronic kidney disease, stage 2 (mild): Secondary | ICD-10-CM | POA: Diagnosis not present

## 2020-09-30 DIAGNOSIS — M549 Dorsalgia, unspecified: Secondary | ICD-10-CM | POA: Diagnosis not present

## 2020-09-30 DIAGNOSIS — E1169 Type 2 diabetes mellitus with other specified complication: Secondary | ICD-10-CM

## 2020-09-30 DIAGNOSIS — I10 Essential (primary) hypertension: Secondary | ICD-10-CM | POA: Diagnosis not present

## 2020-09-30 DIAGNOSIS — M5136 Other intervertebral disc degeneration, lumbar region: Secondary | ICD-10-CM

## 2020-09-30 DIAGNOSIS — M5412 Radiculopathy, cervical region: Secondary | ICD-10-CM

## 2020-09-30 DIAGNOSIS — Z09 Encounter for follow-up examination after completed treatment for conditions other than malignant neoplasm: Secondary | ICD-10-CM

## 2020-09-30 DIAGNOSIS — G8929 Other chronic pain: Secondary | ICD-10-CM

## 2020-09-30 MED ORDER — PREGABALIN 25 MG PO CAPS: ORAL_CAPSULE | ORAL | 1 refills | Status: DC

## 2020-09-30 MED ORDER — METHOCARBAMOL 500 MG PO TABS: 500.0000 mg | ORAL_TABLET | Freq: Four times a day (QID) | ORAL | 0 refills | Status: DC

## 2020-09-30 MED ORDER — DULOXETINE HCL 30 MG PO CPEP: 30.0000 mg | ORAL_CAPSULE | Freq: Two times a day (BID) | ORAL | 1 refills | Status: DC

## 2020-09-30 NOTE — Progress Notes (Signed)
Name: EARON RIVEST   MRN: [id number]    DOB: 04/11/1966   Date:09/30/2020       Progress Note  Chief Complaint  Patient presents with  . Diabetes  . Hypertension  . Hyperlipidemia     Subjective:   Cory Rivas is a 54 y.o. male, presents to clinic for   DM:   Pt managing DM with metformin 1000 mg BID Reports good med compliance - better Pt has no SE from meds. Blood sugars - not checking Denies: Polyuria, polydipsia, vision changes, neuropathy, hypoglycemia Recent pertinent labs: Lab Results  Component Value Date   HGBA1C 7.7 (H) 07/01/2020   HGBA1C 6.8 (H) 03/01/2020   HGBA1C 6.1 (H) 06/18/2019   Standard of care and health maintenance: Foot exam:  done DM eye exam:  Scheduled  ACEI/ARB:  yes Statin:  yes   Hypertension:  Worried about BP, high in ER, better today Currently managed on norvasc 10, lisinopril 40 Pt reports good med compliance and denies any SE.   Blood pressure today is well controlled. BP Readings from Last 6 Encounters:  09/30/20 128/74  09/26/20 (!) 165/84  08/08/20 138/78  07/01/20 (!) 176/90  03/24/20 132/78  03/10/20 133/71   Pt denies CP, SOB, exertional sx, LE edema, palpitation, Ha's, visual disturbances, lightheadedness, hypotension, syncope. Dietary efforts for BP?  Trying to be better about diet for HTN and DM, drinking more water, less bad foods/drinks    Hyperlipidemia:  Better med compliance  Currently treated with crestor 40  Last Lipids: Lab Results  Component Value Date   CHOL 225 (H) 07/01/2020   HDL 41 07/01/2020   LDLCALC 158 (H) 07/01/2020   TRIG 140 07/01/2020   CHOLHDL 5.5 (H) 07/01/2020   - Denies: Chest pain, shortness of breath, myalgias, claudication  Cervical radiculopathy - ER visit 3-4 days ago, note and MRI reviewed, he has f/up with specialists Left neck, upper shoulder and left arm pain, weakness, numbness, severe Given steroids, robaxin, mobic and narcotic pain meds Chronic pain - cymbalta  and lyrica  Seeing specialists for f/up in a few days, ER visit for neck and left arm pain - Cervical MRI done: IMPRESSION: 1. 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 CT would be helpful to better assess the degree of ossification. 2. Multilevel moderate-to-severe neural foraminal stenosis, worst at right C5-6.  Gout- no flare in the past 3 months on his allopurinol      Current Outpatient Medications:  .  allopurinol (ZYLOPRIM) 300 MG tablet, Take 1 tablet (300 mg total) by mouth daily., Disp: 90 tablet, Rfl: 3 .  amLODipine (NORVASC) 10 MG tablet, Take 1 tablet (10 mg total) by mouth daily., Disp: 90 tablet, Rfl: 3 .  DULoxetine (CYMBALTA) 30 MG capsule, Take 1 capsule (30 mg total) by mouth daily., Disp: 90 capsule, Rfl: 3 .  indomethacin (INDOCIN) 25 MG capsule, Take 50 mg PO TID x 2 d at onset of gout flare, then take 25 mg po TID x 3 d, Disp: 20 capsule, Rfl: 3 .  lisinopril (ZESTRIL) 40 MG tablet, Take 1 tablet (40 mg total) by mouth daily., Disp: 90 tablet, Rfl: 3 .  meloxicam (MOBIC) 15 MG tablet, Take 1 tablet (15 mg total) by mouth daily., Disp: 30 tablet, Rfl: 0 .  metFORMIN (GLUCOPHAGE) 1000 MG tablet, Take 1 tablet (1,000 mg total) by mouth 2 (two) times daily  with a meal., Disp: 180 tablet, Rfl: 1 .  methocarbamol (ROBAXIN) 500 MG tablet, Take 1 tablet (500 mg total) by mouth 4 (four) times daily., Disp: 16 tablet, Rfl: 0 .  oxyCODONE-acetaminophen (PERCOCET/ROXICET) 5-325 MG tablet, Take 1 tablet by mouth every 6 (six) hours as needed for severe pain., Disp: 20 tablet, Rfl: 0 .  predniSONE (DELTASONE) 10 MG tablet, Take 1 tablet (10 mg total) by mouth daily., Disp: 42 tablet, Rfl: 0 .  pregabalin (LYRICA) 25 MG capsule, Take 1 capsule (25 mg total) by mouth 2 (two) times daily., Disp: 180 capsule, Rfl: 1 .  rosuvastatin (CRESTOR) 40 MG tablet, Take 1  tablet (40 mg total) by mouth daily., Disp: 90 tablet, Rfl: 3 .  triamcinolone cream (KENALOG) 0.1 %, Apply 1 application topically 2 (two) times daily., Disp: 30 g, Rfl: 0  Patient Active Problem List   Diagnosis Date Noted  . Polyp of ascending colon   . Rectal polyp   . Class 2 severe obesity with serious comorbidity and body mass index (BMI) of 39.0 to 39.9 in adult ([place]) 04/15/2019  . Chronic kidney disease (CKD) stage G2/A3, mildly decreased glomerular filtration rate (GFR) between 60-89 mL/min/1.73 square meter and albuminuria creatinine ratio greater than 300 mg/g 04/15/2019  . Proteinuria 04/15/2019  . Hyperlipidemia associated with type 2 diabetes mellitus ([place]) 03/18/2019  . Diabetes mellitus ([place]) 03/18/2019  . Idiopathic chronic gout of multiple sites without tophus 03/18/2019  . Essential hypertension 03/18/2019  . Dermatitis 03/18/2019  . Lumbar degenerative disc disease 11/22/2015  . Osteoarthritis of spine with radiculopathy, cervical region 11/22/2015  . Osteoarthritis of spine with radiculopathy, lumbar region 11/22/2015    Past Surgical History:  Procedure Laterality Date  . CHOLECYSTECTOMY    . COLONOSCOPY WITH PROPOFOL N/A 03/10/2020   Procedure: COLONOSCOPY WITH PROPOFOL;  Surgeon: Lucilla Lame, MD;  Location: [facility] ENDOSCOPY;  Service: Endoscopy;  Laterality: N/A;    Family History  Problem Relation Age of Onset  . Heart disease Mother   . Diabetes Mother   . Hyperlipidemia Mother   . Hypertension Mother   . Lung cancer Father   . Hypertension Father   . Stroke Father   . AAA (abdominal aortic aneurysm) Maternal Grandmother   . Heart attack Maternal Grandmother   . Diabetes Maternal Grandfather     Social History   Tobacco Use  . Smoking status: Current Every Day Smoker    Packs/day: 1.50    Types: Cigarettes    Start date: 10/19/1989  . Smokeless tobacco: Never Used  Vaping Use  . Vaping Use: Never used  Substance Use Topics  . Alcohol use: No   . Drug use: No     No Known Allergies  Health Maintenance  Topic Date Due  . OPHTHALMOLOGY EXAM  Never done  . COVID-19 Vaccine (3 - Booster for Pfizer series) 07/20/2020  . HEMOGLOBIN A1C  12/29/2020  . FOOT EXAM  07/01/2021  . COLONOSCOPY  03/11/2023  . TETANUS/TDAP  04/14/2029  . INFLUENZA VACCINE  Completed  . PNEUMOCOCCAL POLYSACCHARIDE VACCINE AGE 33-64 HIGH RISK  Completed  . Hepatitis C Screening  Completed  . HIV Screening  Completed    Chart Review Today: I personally reviewed active problem list, medication list, allergies, family history, social history, health maintenance, notes from last encounter, lab results, imaging with the patient/caregiver today.  Review of Systems  10 Systems reviewed and are negative for acute change except as noted in the HPI.  Objective:  Vitals:   09/30/20 0943  BP: 128/74  Pulse: 75  Resp: 18  Temp: 98 F (36.7 C)  TempSrc: Oral  SpO2: 96%  Weight: 249 lb 4.8 oz (113.1 kg)  Height: 5\' 7"  (1.702 m)    Body mass index is 39.05 kg/m.  Physical Exam Vitals and nursing note reviewed.  Constitutional:      General: He is not in acute distress.    Appearance: Normal appearance. He is well-developed. He is obese. He is not ill-appearing, toxic-appearing or diaphoretic.     Interventions: Face mask in place.     Comments: Appears uncomfortable, non-toxic  HENT:     Head: Normocephalic and atraumatic.     Jaw: No trismus.     Right Ear: External ear normal.     Left Ear: External ear normal.  Eyes:     General: Lids are normal. No scleral icterus.       Right eye: No discharge.        Left eye: No discharge.     Conjunctiva/sclera: Conjunctivae normal.  Neck:     Trachea: Trachea and phonation normal. No tracheal deviation.  Cardiovascular:     Rate and Rhythm: Normal rate and regular rhythm.     Pulses: Normal pulses.          Radial pulses are 2+ on the right side and 2+ on the left side.     Heart sounds: Heart  sounds are distant. No murmur heard. No friction rub. No gallop.   Pulmonary:     Effort: Pulmonary effort is normal. No respiratory distress.     Breath sounds: Normal breath sounds. No stridor. No wheezing, rhonchi or rales.  Abdominal:     General: Bowel sounds are normal. There is no distension.     Palpations: Abdomen is soft.  Musculoskeletal:     Right lower leg: No edema.     Left lower leg: No edema.     Comments: Left arm limited ROM and decreased grip strength Neck pain, limited ROM  Skin:    General: Skin is warm and dry.     Coloration: Skin is not jaundiced.     Findings: No rash.     Nails: There is no clubbing.  Neurological:     Mental Status: He is alert. Mental status is at baseline.     Cranial Nerves: No dysarthria or facial asymmetry.     Motor: No tremor or abnormal muscle tone.     Gait: Gait normal.  Psychiatric:        Mood and Affect: Mood normal.        Speech: Speech normal.        Behavior: Behavior normal. Behavior is cooperative.         Assessment & Plan:   1. Lumbar degenerative disc disease Per specialists Continue lyrica and cymbalta  2. Radiculopathy, cervical Worse, recent flare, ER visit Increase lyrica dose at night Try increasing cymbalta to BID Got steroids, pain meds, mobic and robaxin from ER - f/up with specialists in a few days Refill on robaxin left arm pain/weakness persists  3. Class 3 severe obesity due to excess calories with serious comorbidity and body mass index (BMI) of 40.0 to 44.9 in adult (HCC) - Hemoglobin A1c - Lipid panel - COMPLETE METABOLIC PANEL WITH GFR  4. Hyperlipidemia associated with type 2 diabetes mellitus ([place]) Better statin compliance than last OV, recheck labs Continue crestor 40 mg and diet/lifestyle efforts - Lipid  panel - COMPLETE METABOLIC PANEL WITH GFR  5. Essential hypertension Stable, well controlled today, BP at goal - continue lisinopril 40 mg and norvasc 10 mg, when in pain  it is normal for BP for go higher sometime 20-30 points - reviewed and discussed with pt - today looks great - COMPLETE METABOLIC PANEL WITH GFR  6. Idiopathic chronic gout of multiple sites without tophus No recent flare, monitor renal function continue allopurinon - COMPLETE METABOLIC PANEL WITH GFR  7. Chronic back pain, unspecified back location, unspecified back pain laterality See #1 and #2  8. Chronic kidney disease (CKD) stage G2/A3, mildly decreased glomerular filtration rate (GFR) between 60-89 mL/min/1.73 square meter and albuminuria creatinine ratio greater than 300 mg/g Monitor, pushing fluids - COMPLETE METABOLIC PANEL WITH GFR  9. Accelerated hypertension See #5 - BP better controlled now - COMPLETE METABOLIC PANEL WITH GFR  10. Type 2 diabetes mellitus with other specified complication, without long-term current use of insulin (HCC) Last A1C was higher and DM uncontrolled, he's continued metformin and worked on diet, recheck labs Scheduled for eye exam - Hemoglobin A1c - COMPLETE METABOLIC PANEL WITH GFR  11. Encounter for examination following treatment at hospital reviewed ER visit and results - pt has f/up with specialists  Greater than 50% of this visit was spent in direct face-to-face counseling, obtaining history and physical, discussing and educating pt on treatment plan.  Total time of this visit was 45 min +.  Remainder of time involved but was not limited to reviewing chart (recent and pertinent OV notes and labs), documentation in EMR, and coordinating care and treatment plan.     Return for 4 month f/up DM/HTN/gout.   Delsa Grana, PA-C 09/30/20 9:58 AM

## 2020-10-01 LAB — LIPID PANEL
Cholesterol: 191 mg/dL (ref <200)
HDL: 43 mg/dL (ref >=40)
LDL Cholesterol (Calc): 113 mg/dL (calc) — ABNORMAL HIGH
Non-HDL Cholesterol (Calc): 148 mg/dL (calc) — ABNORMAL HIGH (ref <130)
Total CHOL/HDL Ratio: 4.4 (calc) (ref <5.0)
Triglycerides: 228 mg/dL — ABNORMAL HIGH (ref <150)

## 2020-10-01 LAB — COMPLETE METABOLIC PANEL WITH GFR
AG Ratio: 1.5 (calc) (ref 1.0–2.5)
ALT: 22 U/L (ref 9–46)
AST: 20 U/L (ref 10–35)
Albumin: 3.8 g/dL (ref 3.6–5.1)
Alkaline phosphatase (APISO): 67 U/L (ref 35–144)
BUN: 15 mg/dL (ref 7–25)
CO2: 31 mmol/L (ref 20–32)
Calcium: 8.7 mg/dL (ref 8.6–10.3)
Chloride: 100 mmol/L (ref 98–110)
Creat: 1.16 mg/dL (ref 0.70–1.33)
GFR, Est African American: 83 mL/min/{1.73_m2} (ref >=60)
GFR, Est Non African American: 71 mL/min/{1.73_m2} (ref >=60)
Globulin: 2.6 g/dL (calc) (ref 1.9–3.7)
Glucose, Bld: 244 mg/dL — ABNORMAL HIGH (ref 65–99)
Potassium: 4.1 mmol/L (ref 3.5–5.3)
Sodium: 139 mmol/L (ref 135–146)
Total Bilirubin: 0.6 mg/dL (ref 0.2–1.2)
Total Protein: 6.4 g/dL (ref 6.1–8.1)

## 2020-10-01 LAB — HEMOGLOBIN A1C
Hgb A1c MFr Bld: 11.2 % of total Hgb — ABNORMAL HIGH (ref <5.7)
Mean Plasma Glucose: 275 mg/dL
eAG (mmol/L): 15.2 mmol/L

## 2020-10-03 ENCOUNTER — Other Ambulatory Visit: Payer: Self-pay

## 2020-10-03 ENCOUNTER — Encounter: Payer: Self-pay | Admitting: Cardiology

## 2020-10-03 ENCOUNTER — Ambulatory Visit (INDEPENDENT_AMBULATORY_CARE_PROVIDER_SITE_OTHER): Payer: Medicaid Other | Admitting: Cardiology

## 2020-10-03 VITALS — BP 172/102 | HR 59 | Ht 67.0 in | Wt 251.2 lb

## 2020-10-03 DIAGNOSIS — I10 Essential (primary) hypertension: Secondary | ICD-10-CM | POA: Diagnosis not present

## 2020-10-03 DIAGNOSIS — F17200 Nicotine dependence, unspecified, uncomplicated: Secondary | ICD-10-CM

## 2020-10-03 DIAGNOSIS — R011 Cardiac murmur, unspecified: Secondary | ICD-10-CM | POA: Diagnosis not present

## 2020-10-03 DIAGNOSIS — IMO0001 Reserved for inherently not codable concepts without codable children: Secondary | ICD-10-CM

## 2020-10-03 DIAGNOSIS — Z01818 Encounter for other preprocedural examination: Secondary | ICD-10-CM

## 2020-10-03 NOTE — Progress Notes (Signed)
Cardiology Office Note:    Date:  10/03/2020   ID:  Cory Rivas, DOB May 28, 1966, MRN [id number]  PCP:  Delsa Grana, PA-C  [place] Cardiologist:  No primary care provider on file.  [facility] Electrophysiologist:  None   Referring MD: Delsa Grana, PA-C   Chief Complaint  Patient presents with  . Other    HTN/Heart murmur/cardiac clearance. Meds reviewed verbally with pt.    History of Present Illness:    Cory Rivas is a 54 y.o. male with a hx of hypertension, diabetes, hyperlipidemia, current smoker x35+ years who presents due to heart murmur.  Patient recently saw her primary care physician and was told he has a heart murmur.  He also has neck and low back pain disc disease causing sciatica and also radiculopathy to the left arm.  He can barely move his left arm above the left shoulder level.  Planning on seeing surgery soon for surgical consideration.  Has occasional nonspecific chest discomfort not related with exertion.  Would like to make sure his heart is okay prior to surgery.  Past Medical History:  Diagnosis Date  . Bulging of cervical intervertebral disc   . Diabetes mellitus without complication ([place])   . Gout   . High cholesterol   . Hypertension   . Stress due to family tension 04/15/2019    Past Surgical History:  Procedure Laterality Date  . CHOLECYSTECTOMY    . COLONOSCOPY WITH PROPOFOL N/A 03/10/2020   Procedure: COLONOSCOPY WITH PROPOFOL;  Surgeon: Lucilla Lame, MD;  Location[facility] END[facility]COPY;  Service: Endoscopy;  Laterality: N/A;    Current Medications: Current Meds  Medication Sig  . allopurinol (ZYLOPRIM) 300 MG tablet Take 1 tablet (300 mg total) by mouth daily.  Marland Kitchen amLODipine (NORVASC) 10 MG tablet Take 1 tablet (10 mg total) by mouth daily.  . DULoxetine (CYMBALTA) 30 MG capsule Take 1 capsule (30 mg total) by mouth 2 (two) times daily.  . indomethacin (INDOCIN) 25 MG capsule Take 50 mg PO TID x 2 d at onset of gout flare, then take  25 mg po TID x 3 d  . lisinopril (ZESTRIL) 40 MG tablet Take 1 tablet (40 mg total) by mouth daily.  . meloxicam (MOBIC) 15 MG tablet Take 1 tablet (15 mg total) by mouth daily.  . metFORMIN (GLUCOPHAGE) 1000 MG tablet Take 1 tablet (1,000 mg total) by mouth 2 (two) times daily with a meal.  . methocarbamol (ROBAXIN) 500 MG tablet Take 1 tablet (500 mg total) by mouth 4 (four) times daily.  Marland Kitchen oxyCODONE-acetaminophen (PERCOCET/ROXICET) 5-325 MG tablet Take 1 tablet by mouth every 6 (six) hours as needed for severe pain.  . predniSONE (DELTASONE) 10 MG tablet Take 1 tablet (10 mg total) by mouth daily.  . pregabalin (LYRICA) 25 MG capsule Take 25 mg po in the am and 25-75 mg po qpm for cervical radiculopathy and neuropathy  . rosuvastatin (CRESTOR) 40 MG tablet Take 1 tablet (40 mg total) by mouth daily.  Marland Kitchen triamcinolone cream (KENALOG) 0.1 % Apply 1 application topically 2 (two) times daily.     Allergies:   Patient has no known allergies.   Social History   Socioeconomic History  . Marital status: Divorced    Spouse name: Not on file  . Number of children: 0  . Years of education: 9  . Highest education level: 9th grade  Occupational History  . Occupation: disability  Tobacco Use  . Smoking status: Current Every Day  Smoker    Packs/day: 1.50    Years: 37.00    Pack years: 55.50    Types: Cigarettes    Start date: 10/19/1989  . Smokeless tobacco: Never Used  Vaping Use  . Vaping Use: Never used  Substance and Sexual Activity  . Alcohol use: No  . Drug use: No  . Sexual activity: Not Currently    Partners: Female  Other Topics Concern  . Not on file  Social History Narrative  . Not on file   Social Determinants of Health   Financial Resource Strain: Not on file  Food Insecurity: Not on file  Transportation Needs: Not on file  Physical Activity: Not on file  Stress: Not on file  Social Connections: Not on file     Family History: The patient's family history includes  AAA (abdominal aortic aneurysm) in his maternal grandmother; Diabetes in his maternal grandfather and mother; Heart attack in his maternal grandmother and mother; Heart disease in his mother; Hyperlipidemia in his mother; Hypertension in his father and mother; Lung cancer in his father; Stroke in his father.  ROS:   Please see the history of present illness.     All other systems reviewed and are negative.  EKGs/Labs/Other Studies Reviewed:    The following studies were reviewed today:   EKG:  EKG is  ordered today.  The ekg ordered today demonstrates sinus bradycardia, heart rate 59  Recent Labs: 07/01/2020: Brain Natriuretic Peptide 65; Hemoglobin 15.5; Platelets 234 09/30/2020: ALT 22; BUN 15; Creat 1.16; Potassium 4.1; Sodium 139  Recent Lipid Panel    Component Value Date/Time   CHOL 191 09/30/2020 1019   TRIG 228 (H) 09/30/2020 1019   HDL 43 09/30/2020 1019   CHOLHDL 4.4 09/30/2020 1019   LDLCALC 113 (H) 09/30/2020 1019     Risk Assessment/Calculations:      Physical Exam:    VS:  BP (!) 172/102 (BP Location: Right Arm, Patient Position: Sitting, Cuff Size: Normal)   Pulse (!) 59   Ht 5\' 7"  (1.702 m)   Wt 251 lb 4 oz (114 kg)   SpO2 98%   BMI 39.35 kg/m     Wt Readings from Last 3 Encounters:  10/03/20 251 lb 4 oz (114 kg)  09/30/20 249 lb 4.8 oz (113.1 kg)  09/26/20 250 lb (113.4 kg)     GEN:  Well nourished, well developed in no acute distress HEENT: Normal NECK: No JVD; No carotid bruits LYMPHATICS: No lymphadenopathy CARDIAC: RRR, 2/6 systolic murmur loudest at left lower sternal border.  RESPIRATORY:  Clear to auscultation without rales, wheezing or rhonchi  ABDOMEN: Soft, non-tender, non-distended MUSCULOSKELETAL:  No edema; No deformity  SKIN: Warm and dry NEUROLOGIC:  Alert and oriented x 3 PSYCHIATRIC:  Normal affect   ASSESSMENT:    1. Systolic murmur   2. Pre-op evaluation   3. Smoking   4. Primary hypertension    PLAN:    In order  of problems listed above:  1. Systolic murmur at left lower sternal border, apex.  Get echocardiogram to evaluate any valvular abnormalities, structural abnormalities. 2. Preop evaluation, some chest discomfort, risk factors of hypertension, hyperlipidemia, current smoker.  Echo as above, Lexiscan Myoview to evaluate presence of ischemia. 3. Current smoker, cessation advised. 4. Hypertension, BP initially elevated, repeat performed by myself improved to 150/85.  Continue current medications of lisinopril and amlodipine.  We will up after echo and stress test.  Shared Decision Making/Informed Consent The risks [chest pain,  shortness of breath, cardiac arrhythmias, dizziness, blood pressure fluctuations, myocardial infarction, stroke/transient ischemic attack, nausea, vomiting, allergic reaction, radiation exposure, metallic taste sensation and life-threatening complications (estimated to be 1 in 10,000)], benefits (risk stratification, diagnosing coronary artery disease, treatment guidance) and alternatives of a nuclear stress test were discussed in detail with Mr. Dettmer and he agrees to proceed.    Medication Adjustments/Labs and Tests Ordered: Current medicines are reviewed at length with the patient today.  Concerns regarding medicines are outlined above.  Orders Placed This Encounter  Procedures  . NM Myocar Multi W/Spect W/Wall Motion / EF  . EKG 12-Lead  . ECHOCARDIOGRAM COMPLETE   No orders of the defined types were placed in this encounter.   Patient Instructions  Medication Instructions:  Your physician recommends that you continue on your current medications as directed. Please refer to the Current Medication list given to you today.  *If you need a refill on your cardiac medications before your next appointment, please call your pharmacy*   Lab Work: None Ordered If you have labs (blood work) drawn today and your tests are completely normal, you will receive your results  only by: Marland Kitchen MyChart Message (if you have MyChart) OR . A paper copy in the mail If you have any lab test that is abnormal or we need to change your treatment, we will call you to review the results.   Testing/Procedures:  1.  Your physician has requested that you have an echocardiogram. Echocardiography is a painless test that uses sound waves to create images of your heart. It provides your doctor with information about the size and shape of your heart and how well your heart's chambers and valves are working. This procedure takes approximately one hour. There are no restrictions for this procedure.  2.  [place]       Your caregiver has ordered a Stress Test with nuclear imaging. The purpose of this test is to evaluate the blood supply to your heart muscle. This procedure is referred to as a "Non-Invasive Stress Test." This is because other than having an IV started in your vein, nothing is inserted or "invades" your body. Cardiac stress tests are done to find areas of poor blood flow to the heart by determining the extent of coronary artery disease (CAD). Some patients exercise on a treadmill, which naturally increases the blood flow to your heart, while others who are  unable to walk on a treadmill due to physical limitations have a pharmacologic/chemical stress agent called Lexiscan . This medicine will mimic walking on a treadmill by temporarily increasing your coronary blood flow.      PLEASE REPORT TO [facility] MEDICAL MALL ENTRANCE   THE VOLUNTEERS AT THE FIRST DESK WILL DIRECT YOU WHERE TO GO     *Please note: these test may take anywhere between 2-4 hours to complete       Date of Procedure:_____________________________________   Arrival Time for Procedure:______________________________    PLEASE NOTIFY THE OFFICE AT LEAST 24 HOURS IN ADVANCE IF YOU ARE UNABLE TO KEEP YOUR APPOINTMENT.  [place] 24 HOURS IN ADVANCE IF YOU  ARE UNABLE TO KEEP YOUR APPOINTMENT. [phone]         How to prepare for your Myoview test:         __XX__:  Hold diabetes medication the morning of procedure:  metFORMIN (GLUCOPHAGE),    1. Do not eat or drink after midnight  2. No  caffeine for 24 hours prior to test  3. No smoking 24 hours prior to test.  4. Unless instructed otherwise, Take your medication with a small sips of water.    5.         Ladies, please do not wear dresses. Skirts or pants are appropriate. Please wear a short sleeve shirt.  6. No perfume, cologne or lotion.  7. Wear comfortable walking shoes. No heels!     Follow-Up: At [facility], you and your health needs are our priority.  As part of our continuing mission to provide you with exceptional heart care, we have created designated Provider Care Teams.  These Care Teams include your primary Cardiologist (physician) and Advanced Practice Providers (APPs -  Physician Assistants and Nurse Practitioners) who all work together to provide you with the care you need, when you need it.  We recommend signing up for the patient portal called "MyChart".  Sign up information is provided on this After Visit Summary.  MyChart is used to connect with patients for Virtual Visits (Telemedicine).  Patients are able to view lab/test results, encounter notes, upcoming appointments, etc.  Non-urgent messages can be sent to your provider as well.   To learn more about what you can do with MyChart, go to [email or web address].    Your next appointment:   Follow up after Echo and Myoview  The format for your next appointment:   In Person  Provider:   Kate Sable, MD   Other Instructions      Signed, Kate Sable, MD  10/03/2020 12:50 PM    [place]

## 2020-10-03 NOTE — Patient Instructions (Signed)
Medication Instructions:  Your physician recommends that you continue on your current medications as directed. Please refer to the Current Medication list given to you today.  *If you need a refill on your cardiac medications before your next appointment, please call your pharmacy*   Lab Work: None Ordered If you have labs (blood work) drawn today and your tests are completely normal, you will receive your results only by:  [place] (if you have MyChart) OR  A paper copy in the mail If you have any lab test that is abnormal or we need to change your treatment, we will call you to review the results.   Testing/Procedures:  1.  Your physician has requested that you have an echocardiogram. Echocardiography is a painless test that uses sound waves to create images of your heart. It provides your doctor with information about the size and shape of your heart and how well your hearts chambers and valves are working. This procedure takes approximately one hour. There are no restrictions for this procedure.  2.  [place]       Your caregiver has ordered a Stress Test with nuclear imaging. The purpose of this test is to evaluate the blood supply to your heart muscle. This procedure is referred to as a "Non-Invasive Stress Test." This is because other than having an IV started in your vein, nothing is inserted or "invades" your body. Cardiac stress tests are done to find areas of poor blood flow to the heart by determining the extent of coronary artery disease (CAD). Some patients exercise on a treadmill, which naturally increases the blood flow to your heart, while others who are  unable to walk on a treadmill due to physical limitations have a pharmacologic/chemical stress agent called Lexiscan . This medicine will mimic walking on a treadmill by temporarily increasing your coronary blood flow.      PLEASE REPORT TO [facility] MEDICAL MALL ENTRANCE   THE VOLUNTEERS AT THE FIRST DESK WILL  DIRECT YOU WHERE TO GO     *Please note: these test may take anywhere between 2-4 hours to complete       Date of Procedure:_____________________________________   Arrival Time for Procedure:______________________________    PLEASE NOTIFY THE OFFICE AT LEAST 24 HOURS IN ADVANCE IF YOU ARE UNABLE TO KEEP YOUR APPOINTMENT.  [place] 24 HOURS IN ADVANCE IF YOU ARE UNABLE TO KEEP YOUR APPOINTMENT. [phone]         How to prepare for your Myoview test:         __XX__:  Hold diabetes medication the morning of procedure:  metFORMIN (GLUCOPHAGE),    1. Do not eat or drink after midnight  2. No caffeine for 24 hours prior to test  3. No smoking 24 hours prior to test.  4. Unless instructed otherwise, Take your medication with a small sips of water.    5.         Ladies, please do not wear dresses. Skirts or pants are appropriate. Please wear a short sleeve shirt.  6. No perfume, cologne or lotion.  7. Wear comfortable walking shoes. No heels!     Follow-Up: At [facility], you and your health needs are our priority.  As part of our continuing mission to provide you with exceptional heart care, we have created designated Provider Care Teams.  These Care Teams include your primary Cardiologist (physician) and Advanced Practice Providers (APPs -  Physician Assistants  and Nurse Practitioners) who all work together to provide you with the care you need, when you need it.  We recommend signing up for the patient portal called "MyChart".  Sign up information is provided on this After Visit Summary.  MyChart is used to connect with patients for Virtual Visits (Telemedicine).  Patients are able to view lab/test results, encounter notes, upcoming appointments, etc.  Non-urgent messages can be sent to your provider as well.   To learn more about what you can do with MyChart, go to [email or web address].    Your next appointment:    Follow up after Echo and Myoview  The format for your next appointment:   In Person  Provider:   Kate Sable, MD   Other Instructions

## 2020-10-04 ENCOUNTER — Encounter: Payer: Self-pay | Admitting: Family Medicine

## 2020-10-04 DIAGNOSIS — M4802 Spinal stenosis, cervical region: Secondary | ICD-10-CM | POA: Diagnosis not present

## 2020-10-10 ENCOUNTER — Encounter
Admission: RE | Admit: 2020-10-10 | Discharge: 2020-10-10 | Disposition: A | Discharge status: Home / Self Care | Payer: Medicaid Other | Source: Ambulatory Visit | Attending: Cardiology | Admitting: Cardiology

## 2020-10-10 ENCOUNTER — Other Ambulatory Visit: Payer: Self-pay

## 2020-10-10 DIAGNOSIS — Z01818 Encounter for other preprocedural examination: Secondary | ICD-10-CM | POA: Diagnosis not present

## 2020-10-10 DIAGNOSIS — Z01810 Encounter for preprocedural cardiovascular examination: Secondary | ICD-10-CM

## 2020-10-10 LAB — NM MYOCAR MULTI W/SPECT W/WALL MOTION / EF
LV dias vol: 137 mL (ref 62–150)
LV sys vol: 50 mL
Peak HR: 101 {beats}/min
Percent HR: 60 %
Rest HR: 53 {beats}/min
TID: 1

## 2020-10-10 MED ORDER — TECHNETIUM TC 99M TETROFOSMIN IV KIT
10.0000 | PACK | Freq: Once | INTRAVENOUS | Status: AC | PRN
  Administered 2020-10-10: 10.33 via INTRAVENOUS

## 2020-10-10 MED ORDER — TECHNETIUM TC 99M TETROFOSMIN IV KIT
30.0000 | PACK | Freq: Once | INTRAVENOUS | Status: AC | PRN
  Administered 2020-10-10: 30.434 via INTRAVENOUS

## 2020-10-10 MED ORDER — REGADENOSON 0.4 MG/5ML IV SOLN
0.4000 mg | Freq: Once | INTRAVENOUS | Status: AC
  Administered 2020-10-10: 0.4 mg via INTRAVENOUS

## 2020-10-11 ENCOUNTER — Telehealth: Payer: Self-pay

## 2020-10-11 NOTE — Telephone Encounter (Signed)
The patient has been notified of the result and verbalized understanding.  All questions (if any) were answered.     

## 2020-10-11 NOTE — Telephone Encounter (Signed)
Called and left a message with patients sister for patient to call back for echo results.

## 2020-10-26 DIAGNOSIS — R011 Cardiac murmur, unspecified: Secondary | ICD-10-CM | POA: Insufficient documentation

## 2020-10-26 DIAGNOSIS — G8929 Other chronic pain: Secondary | ICD-10-CM | POA: Insufficient documentation

## 2020-10-26 DIAGNOSIS — Z720 Tobacco use: Secondary | ICD-10-CM | POA: Insufficient documentation

## 2020-10-27 DIAGNOSIS — E871 Hypo-osmolality and hyponatremia: Secondary | ICD-10-CM | POA: Diagnosis not present

## 2020-10-27 DIAGNOSIS — R011 Cardiac murmur, unspecified: Secondary | ICD-10-CM | POA: Diagnosis not present

## 2020-10-27 DIAGNOSIS — G959 Disease of spinal cord, unspecified: Secondary | ICD-10-CM | POA: Diagnosis not present

## 2020-10-27 DIAGNOSIS — E785 Hyperlipidemia, unspecified: Secondary | ICD-10-CM | POA: Diagnosis not present

## 2020-10-27 DIAGNOSIS — Z01818 Encounter for other preprocedural examination: Secondary | ICD-10-CM | POA: Diagnosis not present

## 2020-10-27 DIAGNOSIS — M1A09X0 Idiopathic chronic gout, multiple sites, without tophus (tophi): Secondary | ICD-10-CM | POA: Diagnosis not present

## 2020-10-27 DIAGNOSIS — N182 Chronic kidney disease, stage 2 (mild): Secondary | ICD-10-CM | POA: Diagnosis not present

## 2020-10-27 DIAGNOSIS — G8929 Other chronic pain: Secondary | ICD-10-CM | POA: Diagnosis not present

## 2020-10-27 DIAGNOSIS — E119 Type 2 diabetes mellitus without complications: Secondary | ICD-10-CM | POA: Diagnosis not present

## 2020-10-27 DIAGNOSIS — I10 Essential (primary) hypertension: Secondary | ICD-10-CM | POA: Diagnosis not present

## 2020-10-27 DIAGNOSIS — Z720 Tobacco use: Secondary | ICD-10-CM | POA: Diagnosis not present

## 2020-10-28 LAB — HM DIABETES EYE EXAM

## 2020-10-31 ENCOUNTER — Encounter: Payer: Self-pay | Admitting: Family Medicine

## 2020-10-31 ENCOUNTER — Telehealth (INDEPENDENT_AMBULATORY_CARE_PROVIDER_SITE_OTHER): Payer: Medicaid Other | Admitting: Family Medicine

## 2020-10-31 VITALS — BP 140/90

## 2020-10-31 DIAGNOSIS — F17200 Nicotine dependence, unspecified, uncomplicated: Secondary | ICD-10-CM

## 2020-10-31 DIAGNOSIS — Z716 Tobacco abuse counseling: Secondary | ICD-10-CM | POA: Diagnosis not present

## 2020-10-31 DIAGNOSIS — Z6841 Body Mass Index (BMI) 40.0 and over, adult: Secondary | ICD-10-CM

## 2020-10-31 DIAGNOSIS — Z720 Tobacco use: Secondary | ICD-10-CM

## 2020-10-31 MED ORDER — CHANTIX STARTING MONTH PAK 0.5 MG X 11 & 1 MG X 42 PO TABS: ORAL_TABLET | ORAL | 0 refills | Status: DC

## 2020-10-31 NOTE — Patient Instructions (Addendum)
Call you pharmacy to see if insurance covers Chantix - or see if there is any discount program avaiable  We may be able to try other meds - like wellbutrin if unable to get chantix.  Contact [facility] smoking cessation team -  Contact [facility] -  Office: [phone]  [email or web address]    Steps to Quit Smoking Smoking tobacco is the leading cause of preventable death. It can affect almost every organ in the body. Smoking puts you and people around you at risk for many serious, long-lasting (chronic) diseases. Quitting smoking can be hard, but it is one of the best things that you can do for your health. It is never too late to quit. How do I get ready to quit? When you decide to quit smoking, make a plan to help you succeed. Before you quit:  Pick a date to quit. Set a date within the next 2 weeks to give you time to prepare.  Write down the reasons why you are quitting. Keep this list in places where you will see it often.  Tell your family, friends, and co-workers that you are quitting. Their support is important.  Talk with your doctor about the choices that may help you quit.  Find out if your health insurance will pay for these treatments.  Know the people, places, things, and activities that make you want to smoke (triggers). Avoid them. What first steps can I take to quit smoking?  Throw away all cigarettes at home, at work, and in your car.  Throw away the things that you use when you smoke, such as ashtrays and lighters.  Clean your car. Make sure to empty the ashtray.  Clean your home, including curtains and carpets. What can I do to help me quit smoking? Talk with your doctor about taking medicines and seeing a counselor at the same time. You are more likely to succeed when you do both.  If you are pregnant or breastfeeding, talk with your doctor about counseling or other ways to quit smoking. Do not take medicine to help you quit smoking unless your  doctor tells you to do so. To quit smoking: Quit right away  Quit smoking totally, instead of slowly cutting back on how much you smoke over a period of time.  Go to counseling. You are more likely to quit if you go to counseling sessions regularly. Take medicine You may take medicines to help you quit. Some medicines need a prescription, and some you can buy over-the-counter. Some medicines may contain a drug called nicotine to replace the nicotine in cigarettes. Medicines may:  Help you to stop having the desire to smoke (cravings).  Help to stop the problems that come when you stop smoking (withdrawal symptoms). Your doctor may ask you to use:  Nicotine patches, gum, or lozenges.  Nicotine inhalers or sprays.  Non-nicotine medicine that is taken by mouth. Find resources Find resources and other ways to help you quit smoking and remain smoke-free after you quit. These resources are most helpful when you use them often. They include:  Online chats with a Social worker.  Phone quitlines.  Printed Furniture conservator/restorer.  Support groups or group counseling.  Text messaging programs.  Mobile phone apps. Use apps on your mobile phone or tablet that can help you stick to your quit plan. There are many free apps for mobile phones and tablets as well as websites. Examples include Quit Guide from the [employer] and smokefree.gov   What things can I  do to make it easier to quit?  Talk to your family and friends. Ask them to support and encourage you.  Call a phone quitline (1-800-QUIT-NOW), reach out to support groups, or work with a Social worker.  Ask people who smoke to not smoke around you.  Avoid places that make you want to smoke, such as: ? Bars. ? Parties. ? Smoke-break areas at work.  Spend time with people who do not smoke.  Lower the stress in your life. Stress can make you want to smoke. Try these things to help your stress: ? Getting regular exercise. ? Doing deep-breathing  exercises. ? Doing yoga. ? Meditating. ? Doing a body scan. To do this, close your eyes, focus on one area of your body at a time from head to toe. Notice which parts of your body are tense. Try to relax the muscles in those areas.   How will I feel when I quit smoking? Day 1 to 3 weeks Within the first 24 hours, you may start to have some problems that come from quitting tobacco. These problems are very bad 2-3 days after you quit, but they do not often last for more than 2-3 weeks. You may get these symptoms:  Mood swings.  Feeling restless, nervous, angry, or annoyed.  Trouble concentrating.  Dizziness.  Strong desire for high-sugar foods and nicotine.  Weight gain.  Trouble pooping (constipation).  Feeling like you may vomit (nausea).  Coughing or a sore throat.  Changes in how the medicines that you take for other issues work in your body.  Depression.  Trouble sleeping (insomnia). Week 3 and afterward After the first 2-3 weeks of quitting, you may start to notice more positive results, such as:  Better sense of smell and taste.  Less coughing and sore throat.  Slower heart rate.  Lower blood pressure.  Clearer skin.  Better breathing.  Fewer sick days. Quitting smoking can be hard. Do not give up if you fail the first time. Some people need to try a few times before they succeed. Do your best to stick to your quit plan, and talk with your doctor if you have any questions or concerns. Summary  Smoking tobacco is the leading cause of preventable death. Quitting smoking can be hard, but it is one of the best things that you can do for your health.  When you decide to quit smoking, make a plan to help you succeed.  Quit smoking right away, not slowly over a period of time.  When you start quitting, seek help from your doctor, family, or friends. This information is not intended to replace advice given to you by your health care provider. Make sure you  discuss any questions you have with your health care provider. Document Revised: 06/26/2019 Document Reviewed: 12/20/2018 Elsevier Patient Education  [place].

## 2020-10-31 NOTE — Progress Notes (Signed)
Name: Cory Rivas   MRN: [id number]    DOB: 1965-12-13   Date:10/31/2020       Progress Note  Subjective:    Chief Complaint  Chief Complaint  Patient presents with  . Smoking medication    I connected with  [name] on 10/31/20 at  1:00 PM EST by telephone and verified that I am speaking with the correct person using two identifiers.   I discussed the limitations, risks, security and privacy concerns of performing an evaluation and management service by telephone and the availability of in person appointments. Staff also discussed with the patient that there may be a patient responsible charge related to this service.  Patient verbalized understanding and agreed to proceed with encounter. Patient Location: home Provider Location: [facility] Additional Individuals present: none  HPI  Patient is a 55 year old male history of morbid obesity, diabetes, hypertension, hyperlipidemia and a heavy smoker, he presents for appointment regarding smoking cessation support and medical options  Patient is currently smoking less than 1 ppd of cigarettes, cut back from 2 ppd Has quit in the past, but when he restarted smoking he "started smoking like a freight train" -he used to be able to make a pack of cigarettes last several days Hes been working on cutting back for the past couple months, he's having spine surgery in a few weeks and was trying to stop by surgery. He tried a nicotine patch x1d and nicorette gum, interested in chantix or other options.  Patient has no history of mood disorder and does not feel anxious or depressed no suicidal ideations He has managed with chronic pain for the past couple years and feels that his moods are pretty good considering He denies any history of seizures He has not ever tried Chantix in the past   Patient Active Problem List   Diagnosis Date Noted  . Other chronic pain 10/26/2020  . Systolic murmur 42/59/5638  . Tobacco use 10/26/2020  .  Polyp of ascending colon   . Rectal polyp   . Class 2 severe obesity with serious comorbidity and body mass index (BMI) of 39.0 to 39.9 in adult ([place]) 04/15/2019  . Chronic kidney disease (CKD) stage G2/A3, mildly decreased glomerular filtration rate (GFR) between 60-89 mL/min/1.73 square meter and albuminuria creatinine ratio greater than 300 mg/g 04/15/2019  . Proteinuria 04/15/2019  . Hyperlipidemia associated with type 2 diabetes mellitus ([place]) 03/18/2019  . Diabetes mellitus ([place]) 03/18/2019  . Idiopathic chronic gout of multiple sites without tophus 03/18/2019  . Benign essential hypertension 03/18/2019  . Dermatitis 03/18/2019  . Lumbar degenerative disc disease 11/22/2015  . Cervical myelopathy ([place]) 11/22/2015  . Osteoarthritis of spine with radiculopathy, lumbar region 11/22/2015    Social History   Tobacco Use  . Smoking status: Current Every Day Smoker    Packs/day: 1.50    Years: 37.00    Pack years: 55.50    Types: Cigarettes    Start date: 10/19/1989  . Smokeless tobacco: Never Used  Substance Use Topics  . Alcohol use: No     Current Outpatient Medications:  .  allopurinol (ZYLOPRIM) 300 MG tablet, Take 1 tablet (300 mg total) by mouth daily., Disp: 90 tablet, Rfl: 3 .  amLODipine (NORVASC) 10 MG tablet, Take 1 tablet (10 mg total) by mouth daily., Disp: 90 tablet, Rfl: 3 .  DULoxetine (CYMBALTA) 30 MG capsule, Take 1 capsule (30 mg total) by mouth 2 (two) times daily., Disp: 180 capsule, Rfl: 1 .  indomethacin (INDOCIN) 25 MG capsule, Take 50 mg PO TID x 2 d at onset of gout flare, then take 25 mg po TID x 3 d, Disp: 20 capsule, Rfl: 3 .  lisinopril (ZESTRIL) 40 MG tablet, Take 1 tablet (40 mg total) by mouth daily., Disp: 90 tablet, Rfl: 3 .  meloxicam (MOBIC) 15 MG tablet, Take 1 tablet (15 mg total) by mouth daily., Disp: 30 tablet, Rfl: 0 .  metFORMIN (GLUCOPHAGE) 1000 MG tablet, Take 1 tablet (1,000 mg total) by mouth 2 (two) times daily with a meal., Disp:  180 tablet, Rfl: 1 .  methocarbamol (ROBAXIN) 500 MG tablet, Take 1 tablet (500 mg total) by mouth 4 (four) times daily., Disp: 16 tablet, Rfl: 0 .  rosuvastatin (CRESTOR) 40 MG tablet, Take 1 tablet (40 mg total) by mouth daily., Disp: 90 tablet, Rfl: 3 .  triamcinolone cream (KENALOG) 0.1 %, Apply 1 application topically 2 (two) times daily., Disp: 30 g, Rfl: 0 .  oxyCODONE-acetaminophen (PERCOCET/ROXICET) 5-325 MG tablet, Take 1 tablet by mouth every 6 (six) hours as needed for severe pain. (Patient not taking: Reported on 10/31/2020), Disp: 20 tablet, Rfl: 0 .  predniSONE (DELTASONE) 10 MG tablet, Take 1 tablet (10 mg total) by mouth daily. (Patient not taking: Reported on 10/31/2020), Disp: 42 tablet, Rfl: 0 .  pregabalin (LYRICA) 25 MG capsule, Take 25 mg po in the am and 25-75 mg po qpm for cervical radiculopathy and neuropathy (Patient not taking: Reported on 10/31/2020), Disp: 270 capsule, Rfl: 1  No Known Allergies  Chart Review: I personally reviewed active problem list, medication list, allergies, family history, social history, health maintenance, notes from last encounter, lab results, imaging with the patient/caregiver today.   Review of Systems  Constitutional: Negative.   HENT: Negative.   Eyes: Negative.   Respiratory: Negative.   Cardiovascular: Negative.   Gastrointestinal: Negative.   Endocrine: Negative.   Genitourinary: Negative.   Musculoskeletal: Negative.   Skin: Negative.   Allergic/Immunologic: Negative.   Neurological: Negative.   Hematological: Negative.   Psychiatric/Behavioral: Negative.   All other systems reviewed and are negative.    Objective:    Virtual encounter, vitals limited, only able to obtain the following Today's Vitals   10/31/20 1146  BP: 140/90   There is no height or weight on file to calculate BMI. Nursing Note and Vital Signs reviewed.  Physical Exam Vitals and nursing note reviewed.  Pulmonary:     Effort: No respiratory  distress.  Neurological:     Mental Status: He is alert.  Psychiatric:        Mood and Affect: Mood normal.        Behavior: Behavior normal.     PE limited by telephone encounter  No results found for this or any previous visit (from the past 72 hour(s)).  Assessment and Plan:     ICD-10-CM   1. Currently attempting to quit smoking  Z72.0 varenicline (CHANTIX STARTING MONTH PAK) 0.5 MG X 11 & 1 MG X 42 tablet  2. Current smoker  F17.200 varenicline (CHANTIX STARTING MONTH PAK) 0.5 MG X 11 & 1 MG X 42 tablet  3. Encounter for smoking cessation counseling  Z71.6 varenicline (CHANTIX STARTING MONTH PAK) 0.5 MG X 11 & 1 MG X 42 tablet  4. Class 3 severe obesity due to excess calories with serious comorbidity and body mass index (BMI) of 40.0 to 44.9 in adult ([facility])  E66.01    Z68.41   Significant smoking  history started smoking more than 30 years ago a pack to 2 packs/day with roughly 37-pack-year history, recently decreased to 1 pack/day, history of successfully stopping smoking before.  Smoking cessation instruction/counseling given:  counseled patient on the dangers of tobacco use, advised patient to stop smoking, and reviewed strategies to maximize success  Spent more than 10 min of visit discussion smoking cessation - medication options, OTC options and nearby support system/programs available to pt  Did encourage him to seek out the stop smoking program available locally his name will be given to the team at [facility]  Did review with him how to use patches and Nicorette gum  Reviewed with him Chantix -he does seem to be a candidate for Chantix does not have any contraindications I did review with him the medication dosing, use, side effects -I did send to his pharmacy although I do not believe it is covered by insurance  Wellbutrin may also be an option for him if unable to get Chantix?   - I discussed the assessment and treatment plan with the patient. The patient was provided an  opportunity to ask questions and all were answered. The patient agreed with the plan and demonstrated an understanding of the instructions.  - The patient was advised to call back or seek an in-person evaluation if the symptoms worsen or if the condition fails to improve as anticipated.  I provided 25+ minutes of non-face-to-face time during this encounter.  Delsa Grana, PA-C 10/31/20 1:23 PM

## 2020-11-01 DIAGNOSIS — G959 Disease of spinal cord, unspecified: Secondary | ICD-10-CM | POA: Diagnosis not present

## 2020-11-01 DIAGNOSIS — Z5181 Encounter for therapeutic drug level monitoring: Secondary | ICD-10-CM | POA: Diagnosis not present

## 2020-11-01 DIAGNOSIS — E1165 Type 2 diabetes mellitus with hyperglycemia: Secondary | ICD-10-CM | POA: Diagnosis not present

## 2020-11-04 ENCOUNTER — Ambulatory Visit (INDEPENDENT_AMBULATORY_CARE_PROVIDER_SITE_OTHER): Payer: Medicaid Other

## 2020-11-04 ENCOUNTER — Other Ambulatory Visit: Payer: Self-pay

## 2020-11-04 DIAGNOSIS — R011 Cardiac murmur, unspecified: Secondary | ICD-10-CM

## 2020-11-04 LAB — ECHOCARDIOGRAM COMPLETE
AR max vel: 2.91 cm2
AV Area VTI: 3.23 cm2
AV Area mean vel: 2.82 cm2
AV Mean grad: 9 mmHg
AV Peak grad: 17.8 mmHg
Ao pk vel: 2.11 m/s
Area-P 1/2: 3.33 cm2
S' Lateral: 3.2 cm
Single Plane A4C EF: 55.3 %

## 2020-11-04 MED ORDER — PERFLUTREN LIPID MICROSPHERE
1.0000 mL | INTRAVENOUS | Status: AC | PRN
  Administered 2020-11-04: 2 mL via INTRAVENOUS

## 2020-11-08 DIAGNOSIS — Z794 Long term (current) use of insulin: Secondary | ICD-10-CM | POA: Diagnosis not present

## 2020-11-08 DIAGNOSIS — Z5181 Encounter for therapeutic drug level monitoring: Secondary | ICD-10-CM | POA: Diagnosis not present

## 2020-11-08 DIAGNOSIS — E1165 Type 2 diabetes mellitus with hyperglycemia: Secondary | ICD-10-CM | POA: Diagnosis not present

## 2020-11-10 ENCOUNTER — Ambulatory Visit (INDEPENDENT_AMBULATORY_CARE_PROVIDER_SITE_OTHER): Payer: Medicaid Other | Admitting: Cardiology

## 2020-11-10 ENCOUNTER — Other Ambulatory Visit: Payer: Self-pay

## 2020-11-10 ENCOUNTER — Encounter: Payer: Self-pay | Admitting: Cardiology

## 2020-11-10 VITALS — BP 130/80 | HR 68 | Ht 67.0 in | Wt 235.5 lb

## 2020-11-10 DIAGNOSIS — Z01818 Encounter for other preprocedural examination: Secondary | ICD-10-CM

## 2020-11-10 DIAGNOSIS — I10 Essential (primary) hypertension: Secondary | ICD-10-CM | POA: Diagnosis not present

## 2020-11-10 DIAGNOSIS — F17200 Nicotine dependence, unspecified, uncomplicated: Secondary | ICD-10-CM | POA: Diagnosis not present

## 2020-11-10 DIAGNOSIS — R011 Cardiac murmur, unspecified: Secondary | ICD-10-CM | POA: Diagnosis not present

## 2020-11-10 NOTE — Patient Instructions (Signed)
Medication Instructions:   NONE  *If you need a refill on your cardiac medications before your next appointment, please call your pharmacy*   Lab Work:  NONE  If you have labs (blood work) drawn today and your tests are completely normal, you will receive your results only by: [place] MyChart Message (if you have MyChart) OR . A paper copy in the mail If you have any lab test that is abnormal or we need to change your treatment, we will call you to review the results.   Testing/Procedures:  NONE   Follow-Up: At [facility], you and your health needs are our priority.  As part of our continuing mission to provide you with exceptional heart care, we have created designated Provider Care Teams.  These Care Teams include your primary Cardiologist (physician) and Advanced Practice Providers (APPs -  Physician Assistants and Nurse Practitioners) who all work together to provide you with the care you need, when you need it.  We recommend signing up for the patient portal called "MyChart".  Sign up information is provided on this After Visit Summary.  MyChart is used to connect with patients for Virtual Visits (Telemedicine).  Patients are able to view lab/test results, encounter notes, upcoming appointments, etc.  Non-urgent messages can be sent to your provider as well.   To learn more about what you can do with MyChart, go to [email or web address].    Your next appointment:    If you have any concerns you may follow up as needed.  The format for your next appointment:   In Person  Provider:   You may see Dr. Garen Lah or one of the following Advanced Practice Providers on your designated Care Team:    Murray Hodgkins, NP  Christell Faith, PA-C  Marrianne Mood, PA-C  Cadence Clifford, [place]  Laurann [place], NP    Other Instructions

## 2020-11-10 NOTE — Progress Notes (Signed)
Cardiology Office Note:    Date:  11/10/2020   ID:  Cory Rivas, DOB 11/11/1965, MRN [id number]  PCP:  Delsa Grana, PA-C  [place] Cardiologist:  No primary care provider on file.  [facility] Electrophysiologist:  None   Referring MD: Delsa Grana, PA-C   Chief Complaint  Patient presents with  . Other    F/u echo. Meds reviewed verbally with pt.    History of Present Illness:    Cory Rivas is a 55 y.o. male with a hx of hypertension, diabetes, hyperlipidemia, former smoker x35+ years who presents for follow-up.  Last seen due to heart murmur and preop evaluation.  Has degenerative disc disease in the back causing sciatica and also radiculopathy at home.  Surgery is planned next week.  Mention nonspecific chest discomfort.  Echo and Lexiscan Myoview was obtained to evaluate cardiac function and presence of ischemia.  He now presents for results.  He quit smoking about a month ago after starting Chantix.   Past Medical History:  Diagnosis Date  . Bulging of cervical intervertebral disc   . Diabetes mellitus without complication ([place])   . Gout   . High cholesterol   . Hypertension   . Stress due to family tension 04/15/2019    Past Surgical History:  Procedure Laterality Date  . CHOLECYSTECTOMY    . COLONOSCOPY WITH PROPOFOL N/A 03/10/2020   Procedure: COLONOSCOPY WITH PROPOFOL;  Surgeon: Lucilla Lame, MD;  Location: [facility] ENDOSCOPY;  Service: Endoscopy;  Laterality: N/A;    Current Medications: Current Meds  Medication Sig  . allopurinol (ZYLOPRIM) 300 MG tablet Take 1 tablet (300 mg total) by mouth daily.  Marland Kitchen amLODipine (NORVASC) 10 MG tablet Take 1 tablet (10 mg total) by mouth daily.  . Dulaglutide 0.75 MG/0.5ML SOPN Inject into the skin.  . DULoxetine (CYMBALTA) 30 MG capsule Take 1 capsule (30 mg total) by mouth 2 (two) times daily.  . indomethacin (INDOCIN) 25 MG capsule Take 50 mg PO TID x 2 d at onset of gout flare, then take 25 mg po TID x 3 d  .  insulin glargine (LANTUS SOLOSTAR) 100 UNIT/ML Solostar Pen Inject into the skin.  Marland Kitchen insulin lispro (HUMALOG) 100 UNIT/ML KwikPen By correction scale based on pre-meal blood sugar. 2 units/ 50 over 150. TDD up to 45 units  . lisinopril (ZESTRIL) 40 MG tablet Take 1 tablet (40 mg total) by mouth daily.  . meloxicam (MOBIC) 15 MG tablet Take 1 tablet (15 mg total) by mouth daily.  . metFORMIN (GLUCOPHAGE) 1000 MG tablet Take 1 tablet (1,000 mg total) by mouth 2 (two) times daily with a meal.  . methocarbamol (ROBAXIN) 500 MG tablet Take 1 tablet (500 mg total) by mouth 4 (four) times daily.  Marland Kitchen oxyCODONE-acetaminophen (PERCOCET/ROXICET) 5-325 MG tablet Take 1 tablet by mouth every 6 (six) hours as needed for severe pain.  . predniSONE (DELTASONE) 10 MG tablet Take 1 tablet (10 mg total) by mouth daily.  . pregabalin (LYRICA) 25 MG capsule Take 25 mg po in the am and 25-75 mg po qpm for cervical radiculopathy and neuropathy  . rosuvastatin (CRESTOR) 40 MG tablet Take 1 tablet (40 mg total) by mouth daily.  Marland Kitchen triamcinolone cream (KENALOG) 0.1 % Apply 1 application topically 2 (two) times daily.  . varenicline (CHANTIX STARTING MONTH PAK) 0.5 MG X 11 & 1 MG X 42 tablet Take 0.5 mg tablet by mouth 1x daily for 3 days, then increase to 0.5 mg tablet  2x daily for 4 days, then increase to 1 mg tablet 2x daily.  . varenicline (CHANTIX) 1 MG tablet Take 1 mg by mouth 2 (two) times daily.     Allergies:   Patient has no known allergies.   Social History   Socioeconomic History  . Marital status: Divorced    Spouse name: Not on file  . Number of children: 0  . Years of education: 9  . Highest education level: 9th grade  Occupational History  . Occupation: disability  Tobacco Use  . Smoking status: Current Some Day Smoker    Packs/day: 0.25    Years: 37.00    Pack years: 9.25    Types: Cigarettes    Start date: 10/19/1989  . Smokeless tobacco: Never Used  Vaping Use  . Vaping Use: Never used   Substance and Sexual Activity  . Alcohol use: No  . Drug use: No  . Sexual activity: Not Currently    Partners: Female  Other Topics Concern  . Not on file  Social History Narrative  . Not on file   Social Determinants of Health   Financial Resource Strain: Not on file  Food Insecurity: Not on file  Transportation Needs: Not on file  Physical Activity: Not on file  Stress: Not on file  Social Connections: Not on file     Family History: The patient's family history includes AAA (abdominal aortic aneurysm) in his maternal grandmother; Diabetes in his maternal grandfather and mother; Heart attack in his maternal grandmother and mother; Heart disease in his mother; Hyperlipidemia in his mother; Hypertension in his father and mother; Lung cancer in his father; Stroke in his father.  ROS:   Please see the history of present illness.     All other systems reviewed and are negative.  EKGs/Labs/Other Studies Reviewed:    The following studies were reviewed today:   EKG:  EKG is  ordered today.  The ekg ordered today demonstrates sinus bradycardia, heart rate 59  Recent Labs: 07/01/2020: Brain Natriuretic Peptide 65; Hemoglobin 15.5; Platelets 234 09/30/2020: ALT 22; BUN 15; Creat 1.16; Potassium 4.1; Sodium 139  Recent Lipid Panel    Component Value Date/Time   CHOL 191 09/30/2020 1019   TRIG 228 (H) 09/30/2020 1019   HDL 43 09/30/2020 1019   CHOLHDL 4.4 09/30/2020 1019   LDLCALC 113 (H) 09/30/2020 1019     Risk Assessment/Calculations:      Physical Exam:    VS:  BP 130/80 (BP Location: Left Arm, Patient Position: Sitting, Cuff Size: Large)   Pulse 68   Ht 5\' 7"  (1.702 m)   Wt 235 lb 8 oz (106.8 kg)   SpO2 98%   BMI 36.88 kg/m     Wt Readings from Last 3 Encounters:  11/10/20 235 lb 8 oz (106.8 kg)  10/03/20 251 lb 4 oz (114 kg)  09/30/20 249 lb 4.8 oz (113.1 kg)     GEN:  Well nourished, well developed in no acute distress HEENT: Normal NECK: No JVD;  No carotid bruits LYMPHATICS: No lymphadenopathy CARDIAC: RRR, 2/6 systolic murmur loudest at left lower sternal border.  RESPIRATORY:  Clear to auscultation without rales, wheezing or rhonchi  ABDOMEN: Soft, non-tender, non-distended MUSCULOSKELETAL:  No edema; No deformity  SKIN: Warm and dry NEUROLOGIC:  Alert and oriented x 3 PSYCHIATRIC:  Normal affect   ASSESSMENT:    1. Systolic murmur   2. Pre-op evaluation   3. Smoking   4. Primary hypertension  PLAN:    In order of problems listed above:  1. Systolic murmur on exam, echo showed aortic sclerosis with no evidence of stenosis.  EF normal at 60 to 65%.  Aortic sclerosis is likely cause for murmur.  Patient made aware and reassured. 2. Preop evaluation, some chest discomfort, risk factors of hypertension, hyperlipidemia, current smoker.  Echo with normal function, Lexiscan Myoview with no evidence of ischemia.  Patient can undergo surgical procedure at acceptable cardiac risk with no additional intervention.. 3. Former smoker, quit about a month ago.  Patient congratulated on stopping smoking. 4. Hypertension, BP controlled.  Continue current medications of lisinopril and amlodipine.  Follow-up as needed    Medication Adjustments/Labs and Tests Ordered: Current medicines are reviewed at length with the patient today.  Concerns regarding medicines are outlined above.  No orders of the defined types were placed in this encounter.  No orders of the defined types were placed in this encounter.   Patient Instructions  Medication Instructions:   NONE  *If you need a refill on your cardiac medications before your next appointment, please call your pharmacy*   Lab Work:  NONE  If you have labs (blood work) drawn today and your tests are completely normal, you will receive your results only by: Marland Kitchen MyChart Message (if you have MyChart) OR . A paper copy in the mail If you have any lab test that is abnormal or we need  to change your treatment, we will call you to review the results.   Testing/Procedures:  NONE   Follow-Up: At [facility], you and your health needs are our priority.  As part of our continuing mission to provide you with exceptional heart care, we have created designated Provider Care Teams.  These Care Teams include your primary Cardiologist (physician) and Advanced Practice Providers (APPs -  Physician Assistants and Nurse Practitioners) who all work together to provide you with the care you need, when you need it.  We recommend signing up for the patient portal called "MyChart".  Sign up information is provided on this After Visit Summary.  MyChart is used to connect with patients for Virtual Visits (Telemedicine).  Patients are able to view lab/test results, encounter notes, upcoming appointments, etc.  Non-urgent messages can be sent to your provider as well.   To learn more about what you can do with MyChart, go to [email or web address].    Your next appointment:    If you have any concerns you may follow up as needed.  The format for your next appointment:   In Person  Provider:   You may see Dr. Garen Lah or one of the following Advanced Practice Providers on your designated Care Team:    Murray Hodgkins, NP  Christell Faith, PA-C  Marrianne Mood, PA-C  Cadence [place], [place]  Laurann Montana, NP    Other Instructions      Signed, Kate Sable, MD  11/10/2020 12:33 PM    [place]

## 2020-11-13 DIAGNOSIS — Z20822 Contact with and (suspected) exposure to covid-19: Secondary | ICD-10-CM | POA: Diagnosis not present

## 2020-11-13 DIAGNOSIS — Z01818 Encounter for other preprocedural examination: Secondary | ICD-10-CM | POA: Diagnosis not present

## 2020-12-22 DIAGNOSIS — H2511 Age-related nuclear cataract, right eye: Secondary | ICD-10-CM | POA: Diagnosis not present

## 2020-12-22 DIAGNOSIS — H25811 Combined forms of age-related cataract, right eye: Secondary | ICD-10-CM | POA: Diagnosis not present

## 2020-12-22 DIAGNOSIS — E119 Type 2 diabetes mellitus without complications: Secondary | ICD-10-CM | POA: Diagnosis not present

## 2020-12-22 DIAGNOSIS — Z01818 Encounter for other preprocedural examination: Secondary | ICD-10-CM | POA: Diagnosis not present

## 2021-01-05 DIAGNOSIS — H2512 Age-related nuclear cataract, left eye: Secondary | ICD-10-CM | POA: Diagnosis not present

## 2021-01-16 DIAGNOSIS — E1165 Type 2 diabetes mellitus with hyperglycemia: Secondary | ICD-10-CM | POA: Diagnosis not present

## 2021-01-16 DIAGNOSIS — Z794 Long term (current) use of insulin: Secondary | ICD-10-CM | POA: Diagnosis not present

## 2021-01-26 ENCOUNTER — Other Ambulatory Visit: Payer: Self-pay

## 2021-01-26 ENCOUNTER — Encounter: Payer: Self-pay | Admitting: Family Medicine

## 2021-01-26 ENCOUNTER — Ambulatory Visit (INDEPENDENT_AMBULATORY_CARE_PROVIDER_SITE_OTHER): Payer: Medicaid Other | Admitting: Family Medicine

## 2021-01-26 DIAGNOSIS — E785 Hyperlipidemia, unspecified: Secondary | ICD-10-CM

## 2021-01-26 DIAGNOSIS — I10 Essential (primary) hypertension: Secondary | ICD-10-CM

## 2021-01-26 DIAGNOSIS — E1169 Type 2 diabetes mellitus with other specified complication: Secondary | ICD-10-CM

## 2021-01-26 LAB — COMPREHENSIVE METABOLIC PANEL
AG Ratio: 1.6 (calc) (ref 1.0–2.5)
ALT: 14 U/L (ref 9–46)
AST: 13 U/L (ref 10–35)
Albumin: 4.1 g/dL (ref 3.6–5.1)
Alkaline phosphatase (APISO): 79 U/L (ref 35–144)
BUN: 14 mg/dL (ref 7–25)
CO2: 28 mmol/L (ref 20–32)
Calcium: 8.9 mg/dL (ref 8.6–10.3)
Chloride: 105 mmol/L (ref 98–110)
Creat: 1.07 mg/dL (ref 0.70–1.33)
Globulin: 2.5 g/dL (calc) (ref 1.9–3.7)
Glucose, Bld: 100 mg/dL — ABNORMAL HIGH (ref 65–99)
Potassium: 4.4 mmol/L (ref 3.5–5.3)
Sodium: 140 mmol/L (ref 135–146)
Total Bilirubin: 0.5 mg/dL (ref 0.2–1.2)
Total Protein: 6.6 g/dL (ref 6.1–8.1)

## 2021-01-26 MED ORDER — ROSUVASTATIN CALCIUM 40 MG PO TABS: 40.0000 mg | ORAL_TABLET | Freq: Every day | ORAL | 3 refills | Status: DC

## 2021-01-26 MED ORDER — AMLODIPINE BESYLATE 10 MG PO TABS: 10.0000 mg | ORAL_TABLET | Freq: Every day | ORAL | 3 refills | Status: DC

## 2021-01-26 MED ORDER — LISINOPRIL 40 MG PO TABS: 40.0000 mg | ORAL_TABLET | Freq: Every day | ORAL | 3 refills | Status: DC

## 2021-01-26 NOTE — Progress Notes (Signed)
4/14/20228:31 AM  Cory Rivas 09-08-1966, 55 y.o., male [id number]  Chief Complaint  Patient presents with  . Hyperlipidemia  . Hypertension  . Rash    recheck    HPI:   Patient is a 55 y.o. male with past medical history significant for HTN, DM, CKD who presents today for chronic care follow up.  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  Down to 2-3 cigarettes per day Was at 2 packs a day Has to quit before surgery  Had a rash that started 3-4 years ago on his chest Has issues with non sensitive skin soaps  HTN Amlodipine 10 mg Lisinopril 40 mg Checks BP at home Has been SBP in the 120s Goal< 130/80 BP Readings from Last 3 Encounters:  01/26/21 128/72  11/10/20 130/80  10/31/20 140/90   HLD Crestor 40 mg daily Lab Results  Component Value Date   CHOL 191 09/30/2020   HDL 43 09/30/2020   LDLCALC 113 (H) 09/30/2020   TRIG 228 (H) 09/30/2020   CHOLHDL 4.4 09/30/2020   The 10-year ASCVD risk score Denman George DC Jr., et al., 2013) is: 21.3%   Values used to calculate the score:     Age: 89 years     Sex: Male     Is Non-Hispanic African American: No     Diabetic: Yes     Tobacco smoker: Yes     Systolic Blood Pressure: 128 mmHg     Is BP treated: Yes     HDL Cholesterol: 43 mg/dL     Total Cholesterol: 191 mg/dL  DM (Managed by [facility]) Trulicity 1.5 Metformin 1000mg  bid Lantus Humalog Lab Results  Component Value Date   HGBA1C 11.2 (H) 09/30/2020  Last A1c 01/16/21 7.1   Depression screen [facility] 2/9 01/26/2021 10/31/2020 09/30/2020  Decreased Interest 0 0 0  Down, Depressed, Hopeless 0 0 0  PHQ - 2 Score 0 0 0  Altered sleeping - 3 -  Tired, decreased energy - 2 -  Change in appetite - 0 -  Feeling bad or failure about yourself  - 0 -  Trouble concentrating - 0 -  Moving slowly or fidgety/restless - 0 -   Suicidal thoughts - 0 -  PHQ-9 Score - 5 -  Difficult doing work/chores - Somewhat difficult -  Some recent data might be hidden    Fall Risk  01/26/2021 10/31/2020 09/30/2020 08/08/2020 07/01/2020  Falls in the past year? 0 - 0 0 1  Number falls in past yr: 0 0 0 0 1  Injury with Fall? 0 0 0 0 0  Comment - - - - -  Risk for fall due to : - - - - History of fall(s)  Follow up Falls evaluation completed - Falls evaluation completed Falls evaluation completed -     No Known Allergies  Prior to Admission medications   Medication Sig Start Date End Date Taking? Authorizing Provider  allopurinol (ZYLOPRIM) 300 MG tablet Take 1 tablet (300 mg total) by mouth daily. 03/24/20  Yes Danelle Berry, PA-C  amLODipine (NORVASC) 10 MG tablet Take 1 tablet (10 mg total) by mouth daily. 03/01/20  Yes Danelle Berry, PA-C  Dulaglutide 1.5 MG/0.5ML SOPN Inject into the skin. 12/16/20  Yes [provider]  DULoxetine (CYMBALTA) 30 MG capsule Take 1 capsule (30 mg total)  by mouth 2 (two) times daily. 09/30/20  Yes Danelle Berry, PA-C  indomethacin (INDOCIN) 25 MG capsule Take 50 mg PO TID x 2 d at onset of gout flare, then take 25 mg po TID x 3 d 03/24/20  Yes Danelle Berry, PA-C  insulin glargine (LANTUS SOLOSTAR) 100 UNIT/ML Solostar Pen Inject into the skin. 11/01/20 11/01/21 Yes [provider]  insulin lispro (HUMALOG) 100 UNIT/ML KwikPen By correction scale based on pre-meal blood sugar. 2 units/ 50 over 150. TDD up to 45 units 11/08/20  Yes [provider]  lisinopril (ZESTRIL) 40 MG tablet Take 1 tablet (40 mg total) by mouth daily. 03/01/20  Yes Danelle Berry, PA-C  meloxicam (MOBIC) 15 MG tablet Take 1 tablet (15 mg total) by mouth daily. 09/26/20  Yes Cuthriell, Delorise Royals, PA-C  metFORMIN (GLUCOPHAGE) 1000 MG tablet Take 1 tablet (1,000 mg total) by mouth 2 (two) times daily with a meal. 07/14/20  Yes Danelle Berry, PA-C  ofloxacin (OCUFLOX) 0.3 % ophthalmic solution  12/16/20  Yes  [provider]  prednisoLONE acetate (PRED FORTE) 1 % ophthalmic suspension SMARTSIG:In Eye(s) 01/06/21  Yes [provider]  rosuvastatin (CRESTOR) 40 MG tablet Take 1 tablet (40 mg total) by mouth daily. 03/01/20  Yes Danelle Berry, PA-C  triamcinolone cream (KENALOG) 0.1 % Apply 1 application topically 2 (two) times daily. 03/24/20  Yes Danelle Berry, PA-C  varenicline (CHANTIX STARTING MONTH PAK) 0.5 MG X 11 & 1 MG X 42 tablet Take 0.5 mg tablet by mouth 1x daily for 3 days, then increase to 0.5 mg tablet 2x daily for 4 days, then increase to 1 mg tablet 2x daily. 10/31/20  Yes Danelle Berry, PA-C  varenicline (CHANTIX) 1 MG tablet Take 1 mg by mouth 2 (two) times daily. 11/01/20  Yes [provider]  Continuous Blood Gluc Receiver (FREESTYLE LIBRE 2 READER) DEVI  11/28/20   [provider]  Continuous Blood Gluc Sensor (FREESTYLE LIBRE 2 SENSOR) MISC  01/18/21   [provider]    Past Medical History:  Diagnosis Date  . Bulging of cervical intervertebral disc   . Diabetes mellitus without complication ([facility])   . Gout   . High cholesterol   . Hypertension   . Stress due to family tension 04/15/2019    Past Surgical History:  Procedure Laterality Date  . CHOLECYSTECTOMY    . COLONOSCOPY WITH PROPOFOL N/A 03/10/2020   Procedure: COLONOSCOPY WITH PROPOFOL;  Surgeon: Midge Minium, MD;  Location: [facility] ENDOSCOPY;  Service: Endoscopy;  Laterality: N/A;    Social History   Tobacco Use  . Smoking status: Current Some Day Smoker    Packs/day: 0.25    Years: 37.00    Pack years: 9.25    Types: Cigarettes    Start date: 10/19/1989  . Smokeless tobacco: Never Used  Substance Use Topics  . Alcohol use: No    Family History  Problem Relation Age of Onset  . Heart disease Mother   . Diabetes Mother   . Hyperlipidemia Mother   . Hypertension Mother   . Heart attack Mother   . Lung cancer Father   . Hypertension Father   . Stroke Father   . AAA  (abdominal aortic aneurysm) Maternal Grandmother   . Heart attack Maternal Grandmother   . Diabetes Maternal Grandfather     Review of Systems  Eyes:       Seeing optho for vision changes, had surgery last month for bilateral eyes cataracts  Respiratory:  Negative for cough.   Cardiovascular: Negative for chest pain and palpitations.  Musculoskeletal: Positive for back pain and neck pain.  Skin: Positive for rash.  Neurological: Positive for sensory change. Negative for headaches.     OBJECTIVE:  Today's Vitals   01/26/21 0759  BP: 128/72  Pulse: 88  Resp: 18  Temp: 98.5 F (36.9 C)  TempSrc: Oral  SpO2: 99%  Weight: 238 lb 12.8 oz (108.3 kg)  Height: 5\' 7"  (1.702 m)   Body mass index is 37.4 kg/m.   Physical Exam Vitals reviewed.  Constitutional:      Appearance: Normal appearance.  HENT:     Head: Normocephalic and atraumatic.  Eyes:     Conjunctiva/sclera: Conjunctivae normal.     Pupils: Pupils are equal, round, and reactive to light.  Cardiovascular:     Rate and Rhythm: Normal rate and regular rhythm.     Pulses: Normal pulses.     Heart sounds: Normal heart sounds. No murmur heard. No friction rub. No gallop.   Pulmonary:     Effort: Pulmonary effort is normal. No respiratory distress.     Breath sounds: Normal breath sounds. No stridor. No wheezing or rales.  Abdominal:     General: Bowel sounds are normal.     Palpations: Abdomen is soft.     Tenderness: There is no abdominal tenderness.  Musculoskeletal:     Right lower leg: No edema.     Left lower leg: No edema.  Skin:    General: Skin is warm and dry.     Findings: Erythema (chest, forehead, behind ears) present.  Neurological:     General: No focal deficit present.     Mental Status: He is alert and oriented to person, place, and time.  Psychiatric:        Mood and Affect: Mood normal.        Behavior: Behavior normal.     No results found for this or any previous visit (from the past  24 hour(s)).  No results found.   ASSESSMENT and PLAN  Problem List Items Addressed This Visit      Endocrine   Hyperlipidemia associated with type 2 diabetes mellitus (HCC)   Relevant Medications   Dulaglutide 1.5 MG/0.5ML SOPN   lisinopril (ZESTRIL) 40 MG tablet   rosuvastatin (CRESTOR) 40 MG tablet   Diabetes mellitus (HCC)   Relevant Medications   Dulaglutide 1.5 MG/0.5ML SOPN   lisinopril (ZESTRIL) 40 MG tablet   rosuvastatin (CRESTOR) 40 MG tablet    Other Visit Diagnoses    Essential hypertension       Relevant Medications   lisinopril (ZESTRIL) 40 MG tablet   amLODipine (NORVASC) 10 MG tablet   rosuvastatin (CRESTOR) 40 MG tablet   Other Relevant Orders   Comprehensive metabolic panel      Plan . Chronic conditions stable on current regimens . Will follow up with lab results . Continue to use sensitive soaps and detergent, use thick oil based lubricants daily   Return in about 6 months (around 07/28/2021).   Macario Carls Lorine Iannaccone, FNP-BC [facility]

## 2021-01-26 NOTE — Patient Instructions (Signed)

## 2021-01-31 DIAGNOSIS — Z01818 Encounter for other preprocedural examination: Secondary | ICD-10-CM | POA: Diagnosis not present

## 2021-01-31 DIAGNOSIS — Z20822 Contact with and (suspected) exposure to covid-19: Secondary | ICD-10-CM | POA: Diagnosis not present

## 2021-02-01 DIAGNOSIS — E1165 Type 2 diabetes mellitus with hyperglycemia: Secondary | ICD-10-CM | POA: Diagnosis not present

## 2021-02-01 DIAGNOSIS — Z481 Encounter for planned postprocedural wound closure: Secondary | ICD-10-CM | POA: Diagnosis not present

## 2021-02-01 DIAGNOSIS — I10 Essential (primary) hypertension: Secondary | ICD-10-CM | POA: Diagnosis not present

## 2021-02-01 DIAGNOSIS — J9801 Acute bronchospasm: Secondary | ICD-10-CM | POA: Diagnosis not present

## 2021-02-01 DIAGNOSIS — M4726 Other spondylosis with radiculopathy, lumbar region: Secondary | ICD-10-CM | POA: Diagnosis not present

## 2021-02-01 DIAGNOSIS — N182 Chronic kidney disease, stage 2 (mild): Secondary | ICD-10-CM | POA: Diagnosis not present

## 2021-02-01 DIAGNOSIS — G959 Disease of spinal cord, unspecified: Secondary | ICD-10-CM | POA: Diagnosis not present

## 2021-02-01 DIAGNOSIS — Z794 Long term (current) use of insulin: Secondary | ICD-10-CM | POA: Diagnosis not present

## 2021-02-02 DIAGNOSIS — E1165 Type 2 diabetes mellitus with hyperglycemia: Secondary | ICD-10-CM | POA: Diagnosis not present

## 2021-02-02 DIAGNOSIS — I10 Essential (primary) hypertension: Secondary | ICD-10-CM | POA: Diagnosis not present

## 2021-02-02 DIAGNOSIS — N182 Chronic kidney disease, stage 2 (mild): Secondary | ICD-10-CM | POA: Diagnosis not present

## 2021-02-02 DIAGNOSIS — J9801 Acute bronchospasm: Secondary | ICD-10-CM | POA: Diagnosis not present

## 2021-02-02 DIAGNOSIS — Z794 Long term (current) use of insulin: Secondary | ICD-10-CM | POA: Diagnosis not present

## 2021-02-03 DIAGNOSIS — E1165 Type 2 diabetes mellitus with hyperglycemia: Secondary | ICD-10-CM | POA: Diagnosis not present

## 2021-02-03 DIAGNOSIS — Z794 Long term (current) use of insulin: Secondary | ICD-10-CM | POA: Diagnosis not present

## 2021-02-03 DIAGNOSIS — I10 Essential (primary) hypertension: Secondary | ICD-10-CM | POA: Diagnosis not present

## 2021-02-03 DIAGNOSIS — J9801 Acute bronchospasm: Secondary | ICD-10-CM | POA: Diagnosis not present

## 2021-02-03 DIAGNOSIS — N182 Chronic kidney disease, stage 2 (mild): Secondary | ICD-10-CM | POA: Diagnosis not present

## 2021-02-06 DIAGNOSIS — I10 Essential (primary) hypertension: Secondary | ICD-10-CM | POA: Diagnosis not present

## 2021-02-06 DIAGNOSIS — N182 Chronic kidney disease, stage 2 (mild): Secondary | ICD-10-CM | POA: Diagnosis not present

## 2021-02-06 DIAGNOSIS — E1165 Type 2 diabetes mellitus with hyperglycemia: Secondary | ICD-10-CM | POA: Diagnosis not present

## 2021-02-06 DIAGNOSIS — G959 Disease of spinal cord, unspecified: Secondary | ICD-10-CM | POA: Diagnosis not present

## 2021-02-07 DIAGNOSIS — I10 Essential (primary) hypertension: Secondary | ICD-10-CM | POA: Diagnosis not present

## 2021-02-07 DIAGNOSIS — L219 Seborrheic dermatitis, unspecified: Secondary | ICD-10-CM | POA: Diagnosis not present

## 2021-02-07 DIAGNOSIS — G959 Disease of spinal cord, unspecified: Secondary | ICD-10-CM | POA: Diagnosis not present

## 2021-02-07 DIAGNOSIS — E1165 Type 2 diabetes mellitus with hyperglycemia: Secondary | ICD-10-CM | POA: Diagnosis not present

## 2021-02-10 DIAGNOSIS — Z981 Arthrodesis status: Secondary | ICD-10-CM | POA: Diagnosis not present

## 2021-02-10 DIAGNOSIS — G959 Disease of spinal cord, unspecified: Secondary | ICD-10-CM | POA: Diagnosis not present

## 2021-02-13 ENCOUNTER — Encounter: Payer: Self-pay | Admitting: Family Medicine

## 2021-02-13 DIAGNOSIS — E1122 Type 2 diabetes mellitus with diabetic chronic kidney disease: Secondary | ICD-10-CM | POA: Diagnosis not present

## 2021-02-13 DIAGNOSIS — I129 Hypertensive chronic kidney disease with stage 1 through stage 4 chronic kidney disease, or unspecified chronic kidney disease: Secondary | ICD-10-CM | POA: Diagnosis not present

## 2021-02-13 DIAGNOSIS — D329 Benign neoplasm of meninges, unspecified: Secondary | ICD-10-CM | POA: Diagnosis not present

## 2021-02-13 DIAGNOSIS — M4802 Spinal stenosis, cervical region: Secondary | ICD-10-CM | POA: Diagnosis not present

## 2021-02-13 DIAGNOSIS — G959 Disease of spinal cord, unspecified: Secondary | ICD-10-CM | POA: Diagnosis not present

## 2021-02-13 DIAGNOSIS — Z4789 Encounter for other orthopedic aftercare: Secondary | ICD-10-CM | POA: Diagnosis not present

## 2021-02-13 DIAGNOSIS — E785 Hyperlipidemia, unspecified: Secondary | ICD-10-CM | POA: Diagnosis not present

## 2021-02-13 DIAGNOSIS — G8929 Other chronic pain: Secondary | ICD-10-CM | POA: Diagnosis not present

## 2021-02-13 DIAGNOSIS — Z981 Arthrodesis status: Secondary | ICD-10-CM | POA: Diagnosis not present

## 2021-02-13 DIAGNOSIS — M1990 Unspecified osteoarthritis, unspecified site: Secondary | ICD-10-CM | POA: Diagnosis not present

## 2021-02-13 DIAGNOSIS — N183 Chronic kidney disease, stage 3 unspecified: Secondary | ICD-10-CM | POA: Diagnosis not present

## 2021-02-13 DIAGNOSIS — M1A9XX0 Chronic gout, unspecified, without tophus (tophi): Secondary | ICD-10-CM | POA: Diagnosis not present

## 2021-02-13 DIAGNOSIS — E1169 Type 2 diabetes mellitus with other specified complication: Secondary | ICD-10-CM

## 2021-02-13 DIAGNOSIS — F17210 Nicotine dependence, cigarettes, uncomplicated: Secondary | ICD-10-CM | POA: Diagnosis not present

## 2021-02-14 DIAGNOSIS — M1A9XX0 Chronic gout, unspecified, without tophus (tophi): Secondary | ICD-10-CM | POA: Diagnosis not present

## 2021-02-14 DIAGNOSIS — G959 Disease of spinal cord, unspecified: Secondary | ICD-10-CM | POA: Diagnosis not present

## 2021-02-14 DIAGNOSIS — F17210 Nicotine dependence, cigarettes, uncomplicated: Secondary | ICD-10-CM | POA: Diagnosis not present

## 2021-02-14 DIAGNOSIS — M4802 Spinal stenosis, cervical region: Secondary | ICD-10-CM | POA: Diagnosis not present

## 2021-02-14 DIAGNOSIS — N183 Chronic kidney disease, stage 3 unspecified: Secondary | ICD-10-CM | POA: Diagnosis not present

## 2021-02-14 DIAGNOSIS — M1990 Unspecified osteoarthritis, unspecified site: Secondary | ICD-10-CM | POA: Diagnosis not present

## 2021-02-14 DIAGNOSIS — E1122 Type 2 diabetes mellitus with diabetic chronic kidney disease: Secondary | ICD-10-CM | POA: Diagnosis not present

## 2021-02-14 DIAGNOSIS — Z4789 Encounter for other orthopedic aftercare: Secondary | ICD-10-CM | POA: Diagnosis not present

## 2021-02-14 DIAGNOSIS — G8929 Other chronic pain: Secondary | ICD-10-CM | POA: Diagnosis not present

## 2021-02-14 DIAGNOSIS — Z981 Arthrodesis status: Secondary | ICD-10-CM | POA: Diagnosis not present

## 2021-02-14 DIAGNOSIS — E785 Hyperlipidemia, unspecified: Secondary | ICD-10-CM | POA: Diagnosis not present

## 2021-02-14 DIAGNOSIS — D329 Benign neoplasm of meninges, unspecified: Secondary | ICD-10-CM | POA: Diagnosis not present

## 2021-02-14 DIAGNOSIS — I129 Hypertensive chronic kidney disease with stage 1 through stage 4 chronic kidney disease, or unspecified chronic kidney disease: Secondary | ICD-10-CM | POA: Diagnosis not present

## 2021-02-16 DIAGNOSIS — Z981 Arthrodesis status: Secondary | ICD-10-CM | POA: Diagnosis not present

## 2021-02-16 DIAGNOSIS — G959 Disease of spinal cord, unspecified: Secondary | ICD-10-CM | POA: Diagnosis not present

## 2021-02-17 ENCOUNTER — Encounter: Payer: Self-pay | Admitting: Podiatry

## 2021-02-17 ENCOUNTER — Ambulatory Visit (INDEPENDENT_AMBULATORY_CARE_PROVIDER_SITE_OTHER): Payer: Medicaid Other | Admitting: Podiatry

## 2021-02-17 ENCOUNTER — Ambulatory Visit (INDEPENDENT_AMBULATORY_CARE_PROVIDER_SITE_OTHER): Payer: Medicaid Other

## 2021-02-17 ENCOUNTER — Other Ambulatory Visit: Payer: Self-pay

## 2021-02-17 DIAGNOSIS — E0843 Diabetes mellitus due to underlying condition with diabetic autonomic (poly)neuropathy: Secondary | ICD-10-CM

## 2021-02-17 DIAGNOSIS — B351 Tinea unguium: Secondary | ICD-10-CM

## 2021-02-17 DIAGNOSIS — M79675 Pain in left toe(s): Secondary | ICD-10-CM

## 2021-02-17 DIAGNOSIS — M79674 Pain in right toe(s): Secondary | ICD-10-CM | POA: Diagnosis not present

## 2021-02-17 NOTE — Progress Notes (Signed)
   SUBJECTIVE Patient with a history of diabetes mellitus presents to office today complaining of elongated, thickened nails that cause pain while ambulating in shoes.  He is unable to trim his own nails, especially since he recently underwent cervical fusion and is wearing a neck brace. Patient is here for further evaluation and treatment.   Past Medical History:  Diagnosis Date  . Bulging of cervical intervertebral disc   . Diabetes mellitus without complication (Cory Rivas)   . Gout   . High cholesterol   . Hypertension   . Stress due to family tension 04/15/2019    OBJECTIVE General Patient is awake, alert, and oriented x 3 and in no acute distress. Derm Skin is dry and supple bilateral. Negative open lesions or macerations. Remaining integument unremarkable. Nails are tender, long, thickened and dystrophic with subungual debris, consistent with onychomycosis, 1-5 bilateral. No signs of infection noted. Vasc  DP and PT pedal pulses palpable bilaterally. Temperature gradient within normal limits.  Neuro Epicritic and protective threshold sensation diminished bilaterally.  Musculoskeletal Exam No symptomatic pedal deformities noted bilateral. Muscular strength within normal limits.  ASSESSMENT 1. Diabetes Mellitus w/ peripheral neuropathy 2. Onychomycosis of nail due to dermatophyte bilateral 3. Pain in foot bilateral  PLAN OF CARE 1. Patient evaluated today. 2. Instructed to maintain good pedal hygiene and foot care. Stressed importance of controlling blood sugar.  3. Mechanical debridement of nails 1-5 bilaterally performed using a nail nipper. Filed with dremel without incident.  4. Return to clinic in 3 mos.     Edrick Kins, DPM [facility]  Dr. Edrick Kins, [address]                                        [place], [place] [address]                Office [phone]  Fax [phone]

## 2021-02-20 ENCOUNTER — Other Ambulatory Visit: Payer: Self-pay | Admitting: Family Medicine

## 2021-02-20 DIAGNOSIS — E1169 Type 2 diabetes mellitus with other specified complication: Secondary | ICD-10-CM

## 2021-02-22 DIAGNOSIS — M1990 Unspecified osteoarthritis, unspecified site: Secondary | ICD-10-CM | POA: Diagnosis not present

## 2021-02-22 DIAGNOSIS — G8929 Other chronic pain: Secondary | ICD-10-CM | POA: Diagnosis not present

## 2021-02-22 DIAGNOSIS — E1122 Type 2 diabetes mellitus with diabetic chronic kidney disease: Secondary | ICD-10-CM | POA: Diagnosis not present

## 2021-02-22 DIAGNOSIS — M1A9XX0 Chronic gout, unspecified, without tophus (tophi): Secondary | ICD-10-CM | POA: Diagnosis not present

## 2021-02-22 DIAGNOSIS — G959 Disease of spinal cord, unspecified: Secondary | ICD-10-CM | POA: Diagnosis not present

## 2021-02-22 DIAGNOSIS — Z981 Arthrodesis status: Secondary | ICD-10-CM | POA: Diagnosis not present

## 2021-02-22 DIAGNOSIS — I129 Hypertensive chronic kidney disease with stage 1 through stage 4 chronic kidney disease, or unspecified chronic kidney disease: Secondary | ICD-10-CM | POA: Diagnosis not present

## 2021-02-22 DIAGNOSIS — E785 Hyperlipidemia, unspecified: Secondary | ICD-10-CM | POA: Diagnosis not present

## 2021-02-22 DIAGNOSIS — Z4789 Encounter for other orthopedic aftercare: Secondary | ICD-10-CM | POA: Diagnosis not present

## 2021-02-22 DIAGNOSIS — F17210 Nicotine dependence, cigarettes, uncomplicated: Secondary | ICD-10-CM | POA: Diagnosis not present

## 2021-02-22 DIAGNOSIS — N183 Chronic kidney disease, stage 3 unspecified: Secondary | ICD-10-CM | POA: Diagnosis not present

## 2021-02-22 DIAGNOSIS — M4802 Spinal stenosis, cervical region: Secondary | ICD-10-CM | POA: Diagnosis not present

## 2021-02-22 DIAGNOSIS — D329 Benign neoplasm of meninges, unspecified: Secondary | ICD-10-CM | POA: Diagnosis not present

## 2021-02-27 DIAGNOSIS — G959 Disease of spinal cord, unspecified: Secondary | ICD-10-CM | POA: Diagnosis not present

## 2021-02-27 DIAGNOSIS — F17210 Nicotine dependence, cigarettes, uncomplicated: Secondary | ICD-10-CM | POA: Diagnosis not present

## 2021-02-27 DIAGNOSIS — I129 Hypertensive chronic kidney disease with stage 1 through stage 4 chronic kidney disease, or unspecified chronic kidney disease: Secondary | ICD-10-CM | POA: Diagnosis not present

## 2021-02-27 DIAGNOSIS — D329 Benign neoplasm of meninges, unspecified: Secondary | ICD-10-CM | POA: Diagnosis not present

## 2021-02-27 DIAGNOSIS — Z981 Arthrodesis status: Secondary | ICD-10-CM | POA: Diagnosis not present

## 2021-02-27 DIAGNOSIS — N183 Chronic kidney disease, stage 3 unspecified: Secondary | ICD-10-CM | POA: Diagnosis not present

## 2021-02-27 DIAGNOSIS — G8929 Other chronic pain: Secondary | ICD-10-CM | POA: Diagnosis not present

## 2021-02-27 DIAGNOSIS — E1122 Type 2 diabetes mellitus with diabetic chronic kidney disease: Secondary | ICD-10-CM | POA: Diagnosis not present

## 2021-02-27 DIAGNOSIS — E785 Hyperlipidemia, unspecified: Secondary | ICD-10-CM | POA: Diagnosis not present

## 2021-02-27 DIAGNOSIS — M1990 Unspecified osteoarthritis, unspecified site: Secondary | ICD-10-CM | POA: Diagnosis not present

## 2021-02-27 DIAGNOSIS — M1A9XX0 Chronic gout, unspecified, without tophus (tophi): Secondary | ICD-10-CM | POA: Diagnosis not present

## 2021-02-27 DIAGNOSIS — M4802 Spinal stenosis, cervical region: Secondary | ICD-10-CM | POA: Diagnosis not present

## 2021-02-27 DIAGNOSIS — Z4789 Encounter for other orthopedic aftercare: Secondary | ICD-10-CM | POA: Diagnosis not present

## 2021-03-04 DIAGNOSIS — D329 Benign neoplasm of meninges, unspecified: Secondary | ICD-10-CM | POA: Diagnosis not present

## 2021-03-04 DIAGNOSIS — I7779 Dissection of other artery: Secondary | ICD-10-CM | POA: Diagnosis not present

## 2021-03-04 DIAGNOSIS — I671 Cerebral aneurysm, nonruptured: Secondary | ICD-10-CM | POA: Diagnosis not present

## 2021-03-06 DIAGNOSIS — I671 Cerebral aneurysm, nonruptured: Secondary | ICD-10-CM | POA: Diagnosis not present

## 2021-03-06 DIAGNOSIS — D329 Benign neoplasm of meninges, unspecified: Secondary | ICD-10-CM | POA: Diagnosis not present

## 2021-03-09 DIAGNOSIS — Z4789 Encounter for other orthopedic aftercare: Secondary | ICD-10-CM | POA: Diagnosis not present

## 2021-03-09 DIAGNOSIS — G959 Disease of spinal cord, unspecified: Secondary | ICD-10-CM | POA: Diagnosis not present

## 2021-03-09 DIAGNOSIS — Z981 Arthrodesis status: Secondary | ICD-10-CM | POA: Diagnosis not present

## 2021-03-09 DIAGNOSIS — M1A9XX0 Chronic gout, unspecified, without tophus (tophi): Secondary | ICD-10-CM | POA: Diagnosis not present

## 2021-03-09 DIAGNOSIS — F17210 Nicotine dependence, cigarettes, uncomplicated: Secondary | ICD-10-CM | POA: Diagnosis not present

## 2021-03-09 DIAGNOSIS — E785 Hyperlipidemia, unspecified: Secondary | ICD-10-CM | POA: Diagnosis not present

## 2021-03-09 DIAGNOSIS — M1990 Unspecified osteoarthritis, unspecified site: Secondary | ICD-10-CM | POA: Diagnosis not present

## 2021-03-09 DIAGNOSIS — I129 Hypertensive chronic kidney disease with stage 1 through stage 4 chronic kidney disease, or unspecified chronic kidney disease: Secondary | ICD-10-CM | POA: Diagnosis not present

## 2021-03-09 DIAGNOSIS — N183 Chronic kidney disease, stage 3 unspecified: Secondary | ICD-10-CM | POA: Diagnosis not present

## 2021-03-09 DIAGNOSIS — D329 Benign neoplasm of meninges, unspecified: Secondary | ICD-10-CM | POA: Diagnosis not present

## 2021-03-09 DIAGNOSIS — E1122 Type 2 diabetes mellitus with diabetic chronic kidney disease: Secondary | ICD-10-CM | POA: Diagnosis not present

## 2021-03-09 DIAGNOSIS — M4802 Spinal stenosis, cervical region: Secondary | ICD-10-CM | POA: Diagnosis not present

## 2021-03-09 DIAGNOSIS — G8929 Other chronic pain: Secondary | ICD-10-CM | POA: Diagnosis not present

## 2021-03-22 DIAGNOSIS — I671 Cerebral aneurysm, nonruptured: Secondary | ICD-10-CM | POA: Diagnosis not present

## 2021-03-22 DIAGNOSIS — G959 Disease of spinal cord, unspecified: Secondary | ICD-10-CM | POA: Diagnosis not present

## 2021-03-31 DIAGNOSIS — Z981 Arthrodesis status: Secondary | ICD-10-CM | POA: Diagnosis not present

## 2021-04-03 IMAGING — US US RENAL
1 series · 14 of 25 positions shown · non-contrast
Comparison: January 15, 2016

CLINICAL DATA: Proteinuria

EXAM:
RENAL / URINARY TRACT ULTRASOUND COMPLETE

[Series 1: us renal · 0.26mm/px · 14 of 37 slices shown]
[im 1/37]
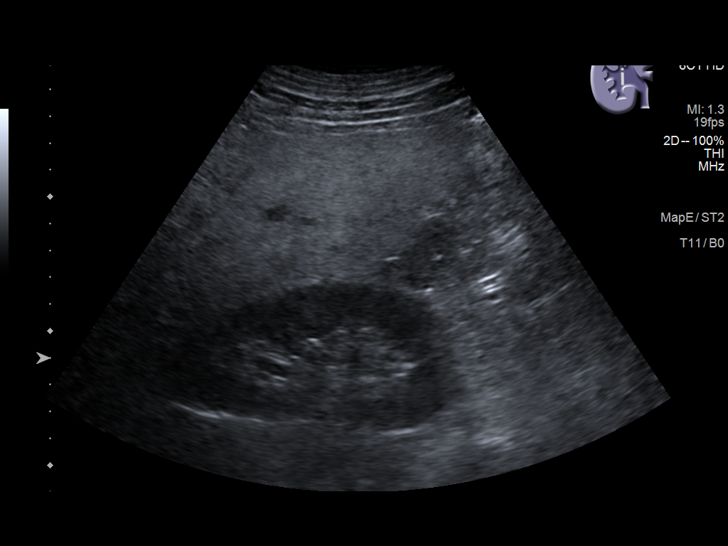
[im 4/37]
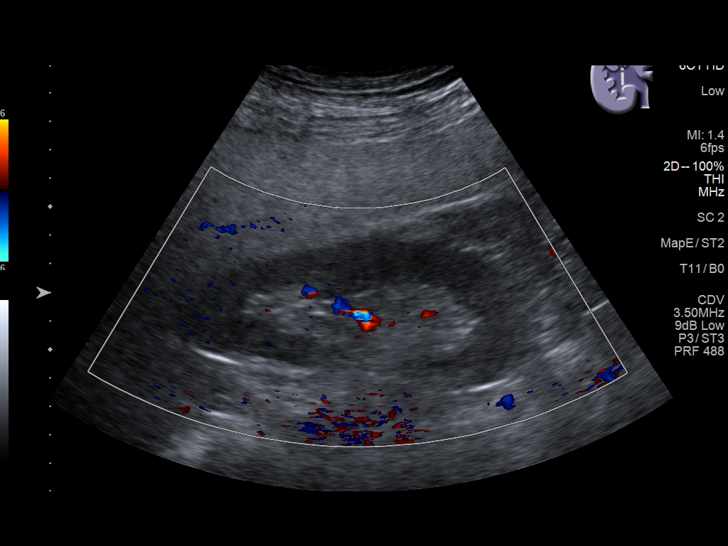
[im 7/37]
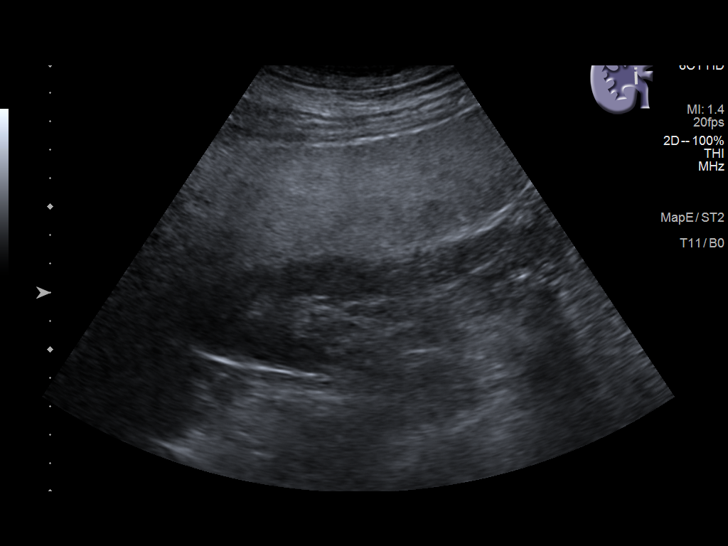
[im 10/37]
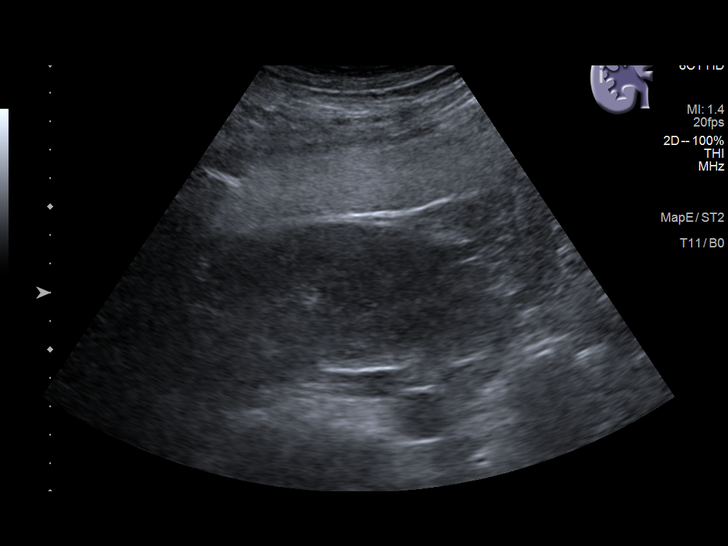
[im 13/37]
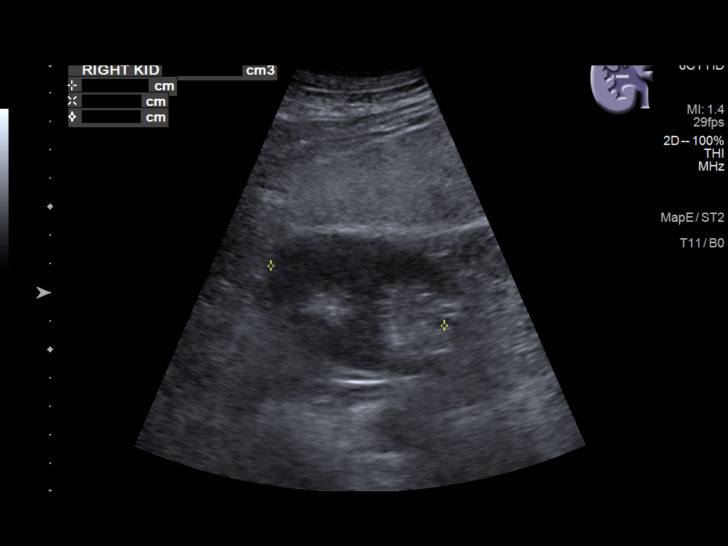
[im 14/37]
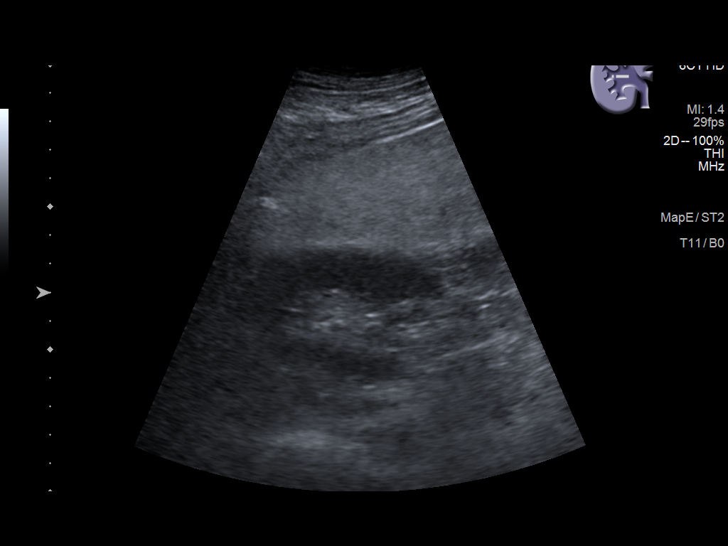
[im 17/37]
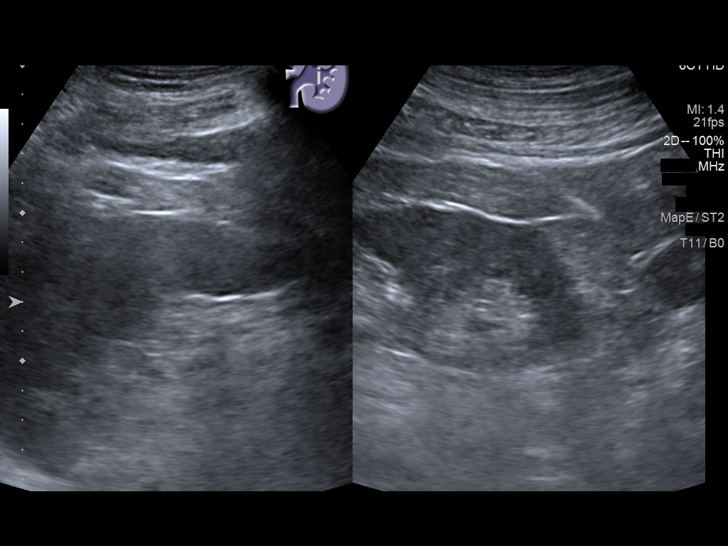
[im 20/37]
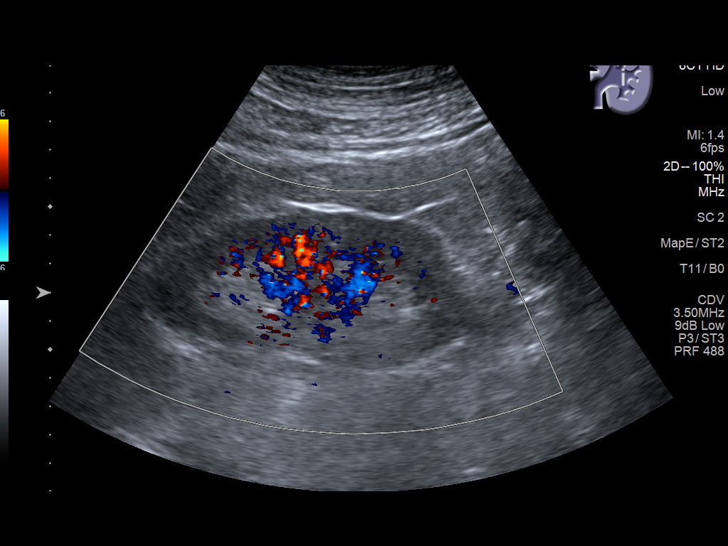
[im 23/37]
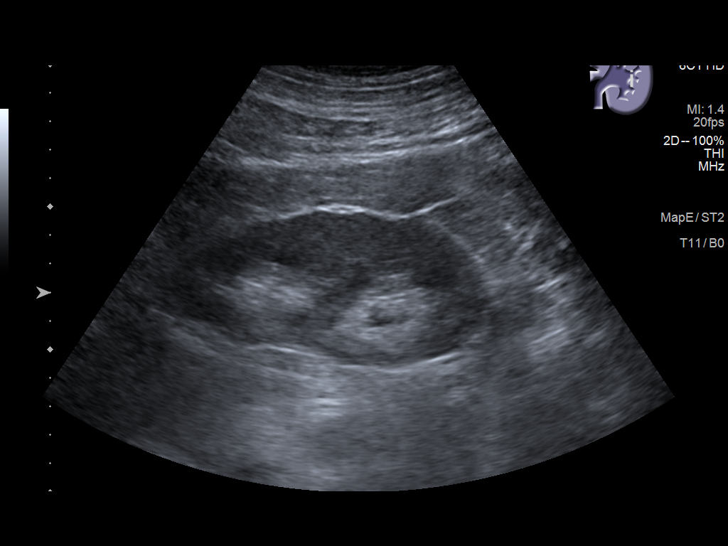
[im 25/37]
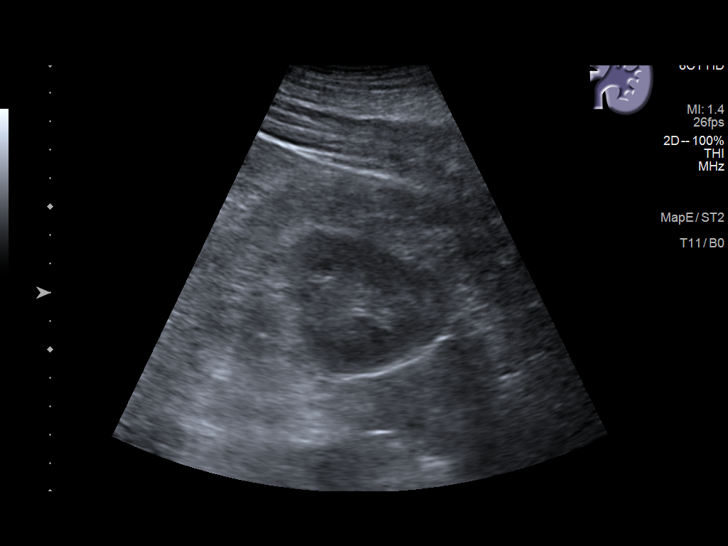
[im 28/37]
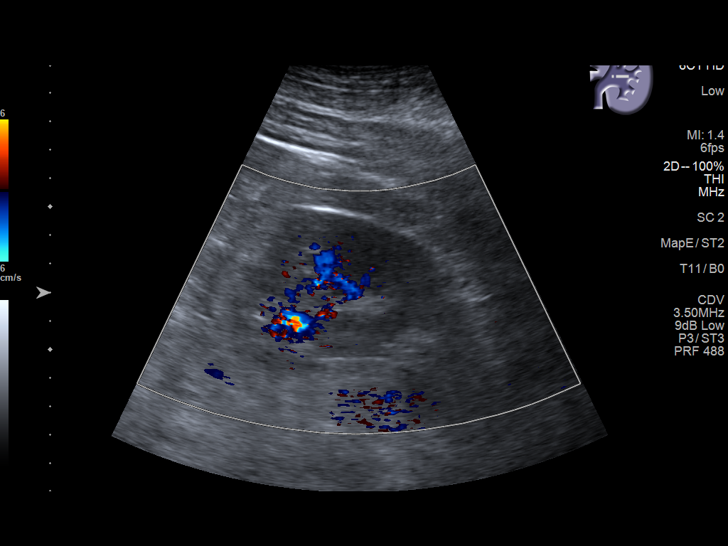
[im 31/37]
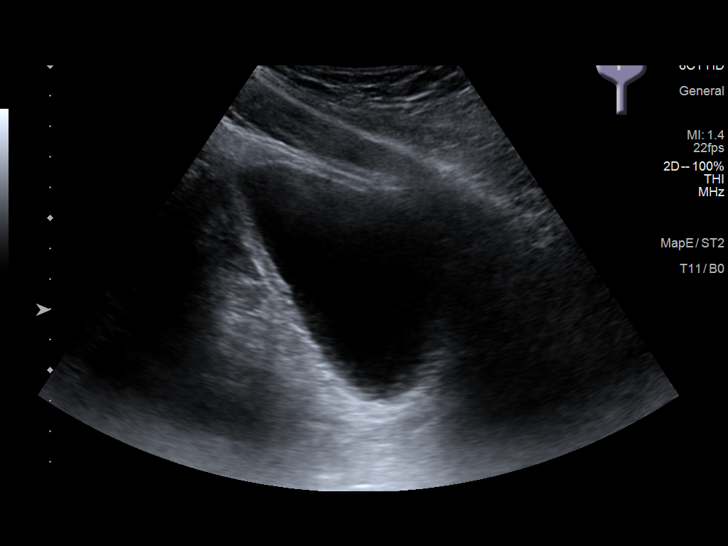
[im 34/37]
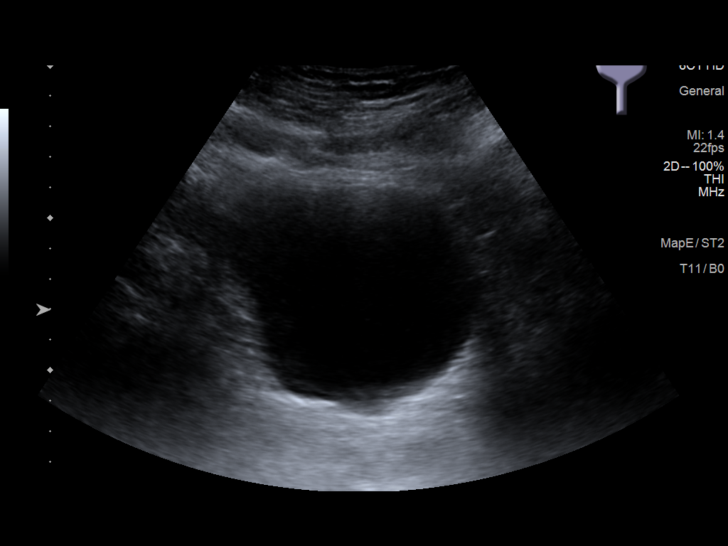
[im 37/37]
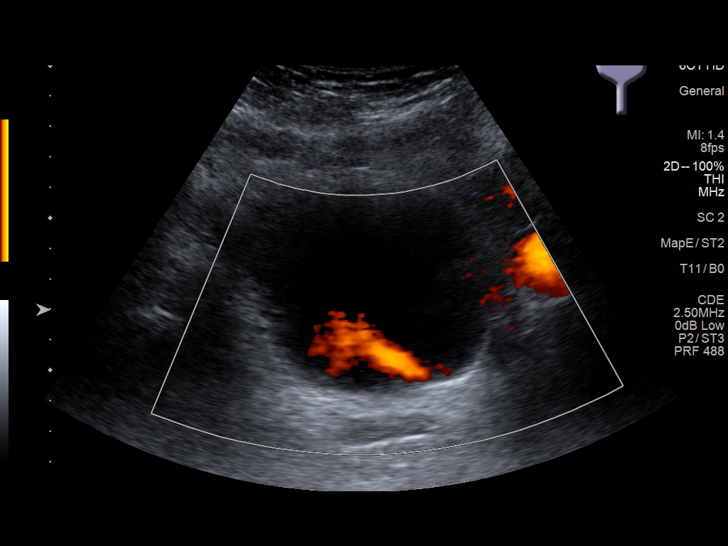

[14 of 25 positions shown; findings below may reference images not displayed]

FINDINGS: Right Kidney:

Renal measurements: 12.4 x 4.8 x 6.4 cm = volume: 201 mL .
Echogenicity within normal limits. No mass or hydronephrosis
visualized.

Left Kidney:

Renal measurements: 11.2 x 5.1 x 6 cm = volume: 187 mL. Echogenicity
within normal limits. No mass or hydronephrosis visualized.

Bladder:

Appears normal for degree of bladder distention. Bilateral ureteral
jets are noted.
IMPRESSION: No focal or acute abnormality identified.

## 2021-04-06 ENCOUNTER — Other Ambulatory Visit: Payer: Self-pay | Admitting: Family Medicine

## 2021-04-06 DIAGNOSIS — M1A09X0 Idiopathic chronic gout, multiple sites, without tophus (tophi): Secondary | ICD-10-CM

## 2021-04-10 ENCOUNTER — Other Ambulatory Visit: Payer: Self-pay | Admitting: Family Medicine

## 2021-04-10 DIAGNOSIS — M1A09X0 Idiopathic chronic gout, multiple sites, without tophus (tophi): Secondary | ICD-10-CM

## 2021-04-10 MED ORDER — ALLOPURINOL 300 MG PO TABS: 300.0000 mg | ORAL_TABLET | Freq: Every day | ORAL | 0 refills | Status: DC

## 2021-04-10 NOTE — Telephone Encounter (Signed)
Copied from [place] [id number]. Topic: Quick Communication - Rx Refill/Question >> Apr 10, 2021  2:56 PM Leward Quan A wrote: Medication: allopurinol (ZYLOPRIM) 300 MG tablet  Has the patient contacted their pharmacy? Yes.   (Agent: If no, request that the patient contact the pharmacy for the refill.) (Agent: If yes, when and what did the pharmacy advise?)  Preferred Pharmacy (with phone number or street name): [place] [place] [address]  Phone:  [phone] Fax:  [phone]     Agent: Please be advised that RX refills may take up to 3 business days. We ask that you follow-up with your pharmacy.

## 2021-04-12 ENCOUNTER — Encounter: Payer: Self-pay | Admitting: Physical Therapy

## 2021-04-12 ENCOUNTER — Ambulatory Visit: Payer: Medicaid Other | Attending: Neurological Surgery | Admitting: Physical Therapy

## 2021-04-12 DIAGNOSIS — M25612 Stiffness of left shoulder, not elsewhere classified: Secondary | ICD-10-CM

## 2021-04-12 DIAGNOSIS — M542 Cervicalgia: Secondary | ICD-10-CM | POA: Diagnosis not present

## 2021-04-12 DIAGNOSIS — R29898 Other symptoms and signs involving the musculoskeletal system: Secondary | ICD-10-CM

## 2021-04-12 DIAGNOSIS — M25611 Stiffness of right shoulder, not elsewhere classified: Secondary | ICD-10-CM

## 2021-04-12 NOTE — Therapy (Addendum)
[facility] [address], [place], [address] Phone: [phone]   Fax:  [phone]  Physical Therapy Evaluation  Patient Details  Name: Cory Rivas MRN: [id number] Date of Birth: 1966/01/15 Referring Provider (PT): Gara Kroner, MD  Encounter Date: 04/12/2021        PT End of Session - 04/12/21 1537     Visit Number 1    Number of Visits 16    Date for PT Re-Evaluation 06/07/21    PT Start Time 0947    PT Stop Time 1420    PT Time Calculation (min) 45 min    Activity Tolerance Patient tolerated treatment well;Patient limited by pain    Behavior During Therapy [facility] for tasks assessed/performed             Past Medical History:  Diagnosis Date   Bulging of cervical intervertebral disc    Diabetes mellitus without complication ([place])    Gout    High cholesterol    Hypertension    Stress due to family tension 04/15/2019    Past Surgical History:  Procedure Laterality Date   CHOLECYSTECTOMY     COLONOSCOPY WITH PROPOFOL N/A 03/10/2020   Procedure: COLONOSCOPY WITH PROPOFOL;  Surgeon: Lucilla Lame, MD;  Location: [facility] ENDOSCOPY;  Service: Endoscopy;  Laterality: N/A;    There were no vitals filed for this visit.    Subjective Assessment - 04/12/21 1529     Subjective Pt notes that he had 7 crushed discs from an injury that occurred in 2014.  Pt notes he fell off the back of a truck and landed on the top of his head in [place].  Pt notes it took a long time to figure out what his problem was and then underwent major surgery.  Pt notes he also has LBP, but the cervical issues has been the primary rea of concern at this time.  Pt underwent C2-T2 fusion for cervical myelopathy on 02/01/21.  Pt notes he has not been able to sleep on his bed since the surgery and is only able to sleep in the recliner.    Patient Stated Goals Increase ROM of cervical spine    Currently in Pain? Yes    Pain Score  7     Pain Location Neck    Pain Descriptors / Indicators Shooting;Tingling    Pain Type Surgical pain    Pain Radiating Towards R hand/digits    Pain Onset More than a month ago    Pain Frequency Constant    Aggravating Factors  sleeping is difficult, being out in the sun, soreness at the top of his head    Pain Relieving Factors nothing    Multiple Pain Sites No             SUBJECTIVE  Chief complaint:  Cervical fusion, limited ROM    Onset: 02/01/21 Referring Dx: cervical fusion MD: Gara Kroner, MD Pain: 7/10 Present, 5/10 Best, 10/10 Worst: Aggravating factors:  sleeping is difficult, being out in the sun, soreness at the top of his head Easing factors: nothing 24 hour pain behavior: little better throughout the day Recent neck trauma: Yes Prior history of neck injury or pain: Yes; fell out the back of a truck and hit his head. Pain quality: pain quality: shooting and tingling Radiating pain: Yes  Numbness/Tingling: Yes Follow-up appointment with MD: Yes; 04/30/21 per pt report. Dominant hand: right Imaging: Yes    OBJECTIVE  Mental Status Patient is oriented to person, place and time.  Recent memory is intact.  Remote memory is intact.  Attention span and concentration are intact.  Expressive speech is intact.  Patient's fund of knowledge is within normal limits for educational level.  SENSATION: Grossly intact to light touch bilateral UE as determined by testing dermatomes C2-T2; pt reports some tingling in the C6 dermatomal pattern. Proprioception and hot/cold testing deferred on this date   MUSCULOSKELETAL: Tremor: None Bulk: Normal Tone: Normal  Posture  Pt is very stiff/rigid in the cervical spine.  Pt has slight forward shoulders in sitting and forward head.    Palpation  Pt has soreness along B UT's and cervical paraspinals.  Pt has bulge on the lateral portion of the the left paraspinals around the C6-C7 area that he notes is tender to  palpation.   Strength R/L 4+*/4+ Shoulder flexion (anterior deltoid/pec major/coracobrachialis, axillary n. (C5/6) and musculocutaneous n. (C5-7)) 4+/4+ Shoulder abduction (deltoid/supraspinatus, axillary/suprascapular n, C5) 4/4+ Elbow flexion (biceps brachii, brachialis, brachioradialis, musculoskeletal n, C5/6) 4/4+ Elbow extension (triceps, radial n, C7) 4+/4 Wrist Extension (C6/7) 5/4- Wrist Flexion (C6/7)    AROM R/L  139/130 Shoulder Flexion 143/145 Shoulder Abduction  9 Cervical Flexion 4 Cervical Extension 6/7 Cervical Lateral Flexion 7/1 Cervical Rotation *Indicates pain, overpressure performed unless otherwise indicated    BP: 140/74  HR 70     ASSESSMENT Clinical Impression: Pt is a pleasant 55 year-old male referred for neck pain following cervical fusion of C2-T2. PT examination reveals deficits of ROM, strength, and pain in the cervical region along with shoulders. Pt presents with deficits in strength, range of motion, and pain in the cervical and B shoulders. Pt will benefit from skilled PT services to address deficits and return to pain-free function at home and with hobbies.    Moderate (evolving): high cholesterol, HTN, smoking, 3 or more body systems/activity limitations/participation restrictions     PLAN Next Visit:  Manual therapy to increase tissue restrictions and overall ROM.   HEP:  Access Code: [email or web address] URL: [email or web address] Date: 04/12/2021 Prepared by: [place]  Exercises Seated Cervical Flexion AROM - 1 x daily - 7 x weekly - 3 sets - 10 reps Seated Cervical Extension AROM - 1 x daily - 7 x weekly - 3 sets - 10 reps Seated Cervical Sidebending AROM - 1 x daily - 7 x weekly - 3 sets - 10 reps Seated Cervical Rotation AROM - 1 x daily - 7 x weekly - 3 sets - 10 reps Seated Cervical Retraction and Extension - 1 x daily - 7 x weekly - 3 sets - 10 reps Seated Cervical Retraction - 1 x daily - 7 x weekly - 3 sets  - 10 reps     Objective measurements completed on examination: See above findings.        Outpatient Rehab from 04/12/2021 in [facility]   04/12/2021   1554    PT LONG TERM GOAL #1    Title Pt will demonstrate decrease in NDI by at least 19% in order to demonstrate clinically significant reduction in disability related to neck injury/pain  Baseline 04/12/21: 70%  Time 8  Period Weeks  Status New  Target Date 06/07/2021   PT LONG TERM GOAL #2    Title Pt will decrease worst neck pain as reported on NPRS by at least 2 points in order to demonstrate clinically significant reduction in back pain.  Baseline 04/12/21:  Worst Pain: 9/10  Time 8  Period Weeks  Status New  Target Date 06/07/2021   PT LONG TERM GOAL #3    Title Pt will increase strength of by at least 1/2 MMT grade in order to demonstrate improvement in strength and function.  Baseline 04/12/21: B UE General MMT: 4+/5  Time 8  Period Weeks  Status New  Target Date 06/07/2021   PT LONG TERM GOAL #4    Title Patient will demonstrate improved function as evidenced by a score of 46 on FOTO measure for full participation in activities at home and in the community.  Baseline 04/12/21: FOTO - 34  Time 8  Period Weeks  Status New  Target Date 06/07/2021        Plan - 04/12/21 1538     Clinical Impression Statement Pt is a pleasant 55 year-old male referred for neck pain following cervical fusion of C2-T2. PT examination reveals deficits of ROM, strength, and pain in the cervical region along with shoulders. Pt presents with deficits in strength, range of motion, and pain in the cervical and B shoulders. Pt will benefit from skilled PT services to address deficits and return to pain-free function at home and with hobbies.    Personal Factors and Comorbidities Comorbidity 3+    Comorbidities Diabete, Gout, High Cholesterol, HTN    Examination-Activity Limitations  Bend;Carry;Dressing;Hygiene/Grooming;Lift    Examination-Participation Restrictions Laundry;Cleaning    Stability/Clinical Decision Making Stable/Uncomplicated    Clinical Decision Making Moderate    Rehab Potential Fair    PT Frequency 2x / week    PT Duration 8 weeks    PT Treatment/Interventions ADLs/Self Care Home Management;Cryotherapy;Electrical Stimulation;Moist Heat;Therapeutic activities;Therapeutic exercise;Manual techniques;Dry needling    PT Next Visit Plan Manual therapy to increase tissue restrictions and overall ROM.    PT Home Exercise Plan Access Code: [email or web address]    Consulted and Agree with Plan of Care Patient             Patient will benefit from skilled therapeutic intervention in order to improve the following deficits and impairments:  Decreased activity tolerance, Decreased range of motion, Decreased strength, Hypomobility, Pain  Visit Diagnosis: Painful cervical ROM - Plan: PT plan of care cert/re-cert  Decreased range of motion of left shoulder - Plan: PT plan of care cert/re-cert  Decreased range of motion of right shoulder - Plan: PT plan of care cert/re-cert  Shoulder weakness - Plan: PT plan of care cert/re-cert     Problem List Patient Active Problem List   Diagnosis Date Noted   Other chronic pain 09/81/1914   Systolic murmur 78/29/5621   Tobacco use 10/26/2020   Polyp of ascending colon    Rectal polyp    Class 2 severe obesity with serious comorbidity and body mass index (BMI) of 39.0 to 39.9 in adult ([place]) 04/15/2019   Chronic kidney disease (CKD) stage G2/A3, mildly decreased glomerular filtration rate (GFR) between 60-89 mL/min/1.73 square meter and albuminuria creatinine ratio greater than 300 mg/g 04/15/2019   Proteinuria 04/15/2019   Hyperlipidemia associated with type 2 diabetes mellitus ([place]) 03/18/2019   Diabetes mellitus ([place]) 03/18/2019   Idiopathic chronic gout of multiple sites without tophus 03/18/2019   Benign essential  hypertension 03/18/2019   Dermatitis 03/18/2019   Lumbar degenerative disc disease 11/22/2015   Cervical myelopathy ([place]) 11/22/2015   Osteoarthritis of spine with radiculopathy, lumbar region 11/22/2015    Gwenlyn Saran, PT, DPT 04/13/21, 8:57 AM   [facility] [address], [place], [address] Phone: [phone]   Fax:  [phone]  Name: Cory Rivas MRN: [id number] Date of Birth: 1966/05/25

## 2021-04-13 NOTE — Addendum Note (Signed)
Addended by: Christie Nottingham on: 04/13/2021 08:55 AM   Modules accepted: Orders

## 2021-04-19 ENCOUNTER — Ambulatory Visit: Payer: Medicaid Other | Attending: Neurological Surgery

## 2021-04-19 ENCOUNTER — Other Ambulatory Visit: Payer: Self-pay

## 2021-04-19 DIAGNOSIS — M25612 Stiffness of left shoulder, not elsewhere classified: Secondary | ICD-10-CM | POA: Insufficient documentation

## 2021-04-19 DIAGNOSIS — R29898 Other symptoms and signs involving the musculoskeletal system: Secondary | ICD-10-CM | POA: Diagnosis not present

## 2021-04-19 DIAGNOSIS — M542 Cervicalgia: Secondary | ICD-10-CM | POA: Insufficient documentation

## 2021-04-19 DIAGNOSIS — M25611 Stiffness of right shoulder, not elsewhere classified: Secondary | ICD-10-CM | POA: Diagnosis not present

## 2021-04-19 NOTE — Therapy (Signed)
[facility] [address], [place], [address] Phone: [phone]   Fax:  [phone]  Physical Therapy Treatment  Patient Details  Name: Cory Rivas MRN: [id number] Date of Birth: 12-Jul-1966 Referring Provider (PT): Gara Kroner, MD   Encounter Date: 04/19/2021   PT End of Session - 04/19/21 1351     Visit Number 2    Number of Visits 16    Date for PT Re-Evaluation 06/07/21    PT Start Time 1300    PT Stop Time 1350    PT Time Calculation (min) 50 min    Activity Tolerance Patient tolerated treatment well;Patient limited by pain    Behavior During Therapy [facility] for tasks assessed/performed             Past Medical History:  Diagnosis Date   Bulging of cervical intervertebral disc    Diabetes mellitus without complication ([place])    Gout    High cholesterol    Hypertension    Stress due to family tension 04/15/2019    Past Surgical History:  Procedure Laterality Date   CHOLECYSTECTOMY     COLONOSCOPY WITH PROPOFOL N/A 03/10/2020   Procedure: COLONOSCOPY WITH PROPOFOL;  Surgeon: Lucilla Lame, MD;  Location: [facility] ENDOSCOPY;  Service: Endoscopy;  Laterality: N/A;    There were no vitals filed for this visit.   Subjective Assessment - 04/19/21 1300     Subjective Pt reports that he has increased neck pain today with numbness in the 2nd-3rd digit on R hand. HEP is going well, continues to have difficulty with extension. No falls or changes to medication since last session. States that he's been sleeping in recliner still, and notes that every now and then he'll experience increased numbness in the L hand.    Patient Stated Goals Increase ROM of cervical spine    Currently in Pain? Yes    Pain Score 3     Pain Location Neck    Pain Descriptors / Indicators Dull;Aching;Shooting    Pain Onset More than a month ago              Interventions: Therapeutic Exercise: to address strength,  endurance, and ROM deficits for improved pain-free independence with all functional mobility. - Cervical flexion/extension AROM, x20 - Cervical sidebending AROM, x20 bil - Cervical rotation AROM, x20 bil - cervical SNAG rotation, 5x10s bil - cervical extension SNAG, 5x10s - cervical retraction, x10 with 5s hold   Pt required multimodal cuing for proper technique and to facilitate improved neuromuscular control, strength, range of motion, and functional ability resulting in improved performance and form.   Manual Therapy: to increase joint mobility, support increased overall ROM, and provide pain relief to joint musculature. - STM to cervical paraspinals, bil SCM, bil LS, bil UT, and suboccipitals (improved tissue tone and c/o N/T in L 2nd-3rd digits post intervention)       PT Education - 04/19/21 1357     Education Details cueing for therex/HEP, updated HEP, soft tissue restriction and N/T, reduce guarding for improved cervical AROM/pain, potential DOMS    Person(s) Educated Patient    Methods Explanation;Demonstration;Tactile cues;Verbal cues;Handout    Comprehension Verbalized understanding;Returned demonstration;Verbal cues required;Tactile cues required;Need further instruction              PT Short Term Goals - 04/12/21 1553       PT SHORT TERM GOAL #1   Title Pt will be independent with HEP  in order to improve strength and decrease back pain in order to improve pain-free function at home and work.    Baseline Given HEP today.    Time 2    Period Weeks    Status New    Target Date 04/26/21               PT Long Term Goals - 04/12/21 1554       PT LONG TERM GOAL #1   Title Pt will demonstrate decrease in NDI by at least 19% in order to demonstrate clinically significant reduction in disability related to neck injury/pain    Baseline 04/12/21: 70%    Time 8    Period Weeks    Status New    Target Date 06/07/21      PT LONG TERM GOAL #2   Title Pt will  decrease worst neck pain as reported on NPRS by at least 2 points in order to demonstrate clinically significant reduction in back pain.    Baseline 04/12/21: Worst Pain: 9/10    Time 8    Period Weeks    Status New    Target Date 06/07/21      PT LONG TERM GOAL #3   Title Pt will increase strength of by at least 1/2 MMT grade in order to demonstrate improvement in strength and function.    Baseline 04/12/21: B UE General MMT: 4+/5    Time 8    Period Weeks    Status New    Target Date 06/07/21      PT LONG TERM GOAL #4   Title Patient will demonstrate improved function as evidenced by a score of 46 on FOTO measure for full participation in activities at home and in the community.    Baseline 04/12/21: FOTO - 34    Time 8    Period Weeks    Status New    Target Date 06/07/21                   Plan - 04/19/21 1306     Clinical Impression Statement Pt tolerated treatment well today. Continues to demonstrate significant limitations with extension, rotation, and sidebending due to acuity of sx. Increased compensatory patterns at thoracic spine and shoulders noted to achieve cervical AROM. Interventions continue to focus on improving cervical AROM with MT and strengthening interventions. Mild improvements noted in AROM after STM, but pt with reports of significantly improved stiffness, pain, N/T in 2nd-3rd L digits. Therefore, PT believe's N/T is due to soft tissue restriction. Pt will continue to benefit from skilled OPPT services to address defcits and improve overall pain-free independence with functional mobility. Will continue per POC.    Personal Factors and Comorbidities Comorbidity 3+    Comorbidities Diabete, Gout, High Cholesterol, HTN    Examination-Activity Limitations Bend;Carry;Dressing;Hygiene/Grooming;Lift    Examination-Participation Restrictions Laundry;Cleaning    Stability/Clinical Decision Making Stable/Uncomplicated    Rehab Potential Fair    PT Frequency 2x /  week    PT Duration 8 weeks    PT Treatment/Interventions ADLs/Self Care Home Management;Cryotherapy;Electrical Stimulation;Moist Heat;Therapeutic activities;Therapeutic exercise;Manual techniques;Dry needling    PT Next Visit Plan Manual therapy to increase tissue restrictions and overall ROM.    PT Home Exercise Plan Access Code: [email or web address]    Consulted and Agree with Plan of Care Patient             Patient will benefit from skilled therapeutic intervention in order to improve the following deficits and  impairments:  Decreased activity tolerance, Decreased range of motion, Decreased strength, Hypomobility, Pain  Visit Diagnosis: Painful cervical ROM  Decreased range of motion of right shoulder  Decreased range of motion of left shoulder  Shoulder weakness     Problem List Patient Active Problem List   Diagnosis Date Noted   Other chronic pain 94/94/4739   Systolic murmur 58/44/1712   Tobacco use 10/26/2020   Polyp of ascending colon    Rectal polyp    Class 2 severe obesity with serious comorbidity and body mass index (BMI) of 39.0 to 39.9 in adult [facility]) 04/15/2019   Chronic kidney disease (CKD) stage G2/A3, mildly decreased glomerular filtration rate (GFR) between 60-89 mL/min/1.73 square meter and albuminuria creatinine ratio greater than 300 mg/g 04/15/2019   Proteinuria 04/15/2019   Hyperlipidemia associated with type 2 diabetes mellitus ([place]) 03/18/2019   Diabetes mellitus ([place]) 03/18/2019   Idiopathic chronic gout of multiple sites without tophus 03/18/2019   Benign essential hypertension 03/18/2019   Dermatitis 03/18/2019   Lumbar degenerative disc disease 11/22/2015   Cervical myelopathy ([place]) 11/22/2015   Osteoarthritis of spine with radiculopathy, lumbar region 11/22/2015   Herminio Commons, PT, DPT 1:58 PM,04/19/21    [facility] [address] [place], [address] Phone: [phone]    Fax:  [phone]  Name: Cory Rivas MRN: [id number] Date of Birth: 01/13/1966

## 2021-04-24 ENCOUNTER — Ambulatory Visit: Payer: Medicaid Other | Admitting: Physical Therapy

## 2021-04-24 ENCOUNTER — Encounter: Payer: Self-pay | Admitting: Physical Therapy

## 2021-04-24 DIAGNOSIS — R29898 Other symptoms and signs involving the musculoskeletal system: Secondary | ICD-10-CM

## 2021-04-24 DIAGNOSIS — M25612 Stiffness of left shoulder, not elsewhere classified: Secondary | ICD-10-CM | POA: Diagnosis not present

## 2021-04-24 DIAGNOSIS — M25611 Stiffness of right shoulder, not elsewhere classified: Secondary | ICD-10-CM | POA: Diagnosis not present

## 2021-04-24 DIAGNOSIS — M542 Cervicalgia: Secondary | ICD-10-CM

## 2021-04-24 NOTE — Therapy (Signed)
[facility] [address], [place], [address] Phone: [phone]   Fax:  [phone]  Physical Therapy Treatment  Patient Details  Name: Cory Rivas MRN: [id number] Date of Birth: [birth year] Referring Provider (PT): Gara Kroner, MD   Encounter Date: 04/24/2021   PT End of Session - 04/24/21 2012     Visit Number 3    Number of Visits 17    Date for PT Re-Evaluation 06/07/21    Authorization Type [place] MEDICAID HEALTHY BLUE reporting period from 04/12/2021    Authorization Time Period ref [id number] 7/6-8/31 16 PT visits(will not cover 97012 mechanical traction )    Authorization - Visit Number 2    Authorization - Number of Visits 16    Progress Note Due on Visit 10    PT Start Time 1605    PT Stop Time 1650    PT Time Calculation (min) 45 min    Activity Tolerance Patient tolerated treatment well;Patient limited by pain    Behavior During Therapy [facility] for tasks assessed/performed             Past Medical History:  Diagnosis Date   Bulging of cervical intervertebral disc    Diabetes mellitus without complication ([place])    Gout    High cholesterol    Hypertension    Stress due to family tension 04/15/2019    Past Surgical History:  Procedure Laterality Date   CHOLECYSTECTOMY     COLONOSCOPY WITH PROPOFOL N/A 03/10/2020   Procedure: COLONOSCOPY WITH PROPOFOL;  Surgeon: Lucilla Lame, MD;  Location: [facility] ENDOSCOPY;  Service: Endoscopy;  Laterality: N/A;    There were no vitals filed for this visit.   Subjective Assessment - 04/24/21 1608     Subjective Patient reports he is feeling well today with no pain upon arrival but continues to have pain in 2nd and 3rd digit on right hand. States HEP is going well and he felt better after last PT session. Continues to sleep sitting up. no numbness in L hand since last PT session. States his neck is "itchy" where around the incision.    Patient Stated  Goals Increase ROM of cervical spine    Currently in Pain? No/denies    Pain Onset More than a month ago            TREATMENT:  MD visit 02/16/2021: "He will follow postoperative guidelines of no heavy lifting, bending, and twisting. I have advised the patient to lift up to 10 pounds until 6 weeks after surgery, at which time his restrictions will be readdressed."  Manual Therapy: to increase joint mobility, support increased overall ROM, and provide pain relief to joint musculature. POSITIONED IN MASSAGE CHAIR - STM to cervical paraspinals, bil SCM, bil LS, bil UT  Therapeutic Exercise: to address strength, endurance, and ROM deficits for improved pain-free independence with all functional mobility. - Cervical flexion/extension AROM, x20 - Standing cervical thoracic extension/BUE flexion and serratus anterior activation, lat stretch, with foam roller up wall, 5 second holds. 1x11 (feels good at shoulders and upper back).  - seated thoracic extension over back of chair with hands clasped behind head/neck, with clinician OP as tolerated 1x10 - Sidelying open book (thoracic rotation) to improve thoracic, shoulder girdle, and upper trunk mobility. Required instruction for technique and cuing to achieve end range as tolerated, hold time, and breathing technique. 5 second holds. 1x10 with L arm moving, 1x7 R arm moving (increasing left  neck pain so stopped early).  - seated scapular retraction for posture 1x10 - standing scapular rows at cable with 5# stack - education on importance of smoking cessation   Pt required multimodal cuing for proper technique and to facilitate improved neuromuscular control, strength, range of motion, and functional ability resulting in improved performance and form.   HOME EXERCISE PROGRAM Access Code: [email or web address] URL: [email or web address] Date: 04/24/2021 Prepared by: Rosita Kea  Exercises Seated Cervical Flexion AROM - 1 x daily - 7 x weekly -  3 sets - 10 reps Seated Cervical Extension AROM - 1 x daily - 7 x weekly - 3 sets - 10 reps Seated Cervical Sidebending AROM - 1 x daily - 7 x weekly - 3 sets - 10 reps Seated Cervical Rotation AROM - 1 x daily - 7 x weekly - 3 sets - 10 reps Seated Cervical Retraction and Extension - 1 x daily - 7 x weekly - 3 sets - 10 reps Seated Cervical Retraction - 1 x daily - 7 x weekly - 3 sets - 10 reps    PT Education - 04/24/21 2015     Education Details Exercise purpose/form. Self management techniques.    Person(s) Educated Patient    Methods Explanation;Demonstration;Tactile cues;Verbal cues    Comprehension Verbalized understanding;Returned demonstration;Verbal cues required;Tactile cues required;Need further instruction              PT Short Term Goals - 04/12/21 1553       PT SHORT TERM GOAL #1   Title Pt will be independent with HEP in order to improve strength and decrease back pain in order to improve pain-free function at home and work.    Baseline Given HEP today.    Time 2    Period Weeks    Status New    Target Date 04/26/21               PT Long Term Goals - 04/12/21 1554       PT LONG TERM GOAL #1   Title Pt will demonstrate decrease in NDI by at least 19% in order to demonstrate clinically significant reduction in disability related to neck injury/pain    Baseline 04/12/21: 70%    Time 8    Period Weeks    Status New    Target Date 06/07/21      PT LONG TERM GOAL #2   Title Pt will decrease worst neck pain as reported on NPRS by at least 2 points in order to demonstrate clinically significant reduction in back pain.    Baseline 04/12/21: Worst Pain: 9/10    Time 8    Period Weeks    Status New    Target Date 06/07/21      PT LONG TERM GOAL #3   Title Pt will increase strength of by at least 1/2 MMT grade in order to demonstrate improvement in strength and function.    Baseline 04/12/21: B UE General MMT: 4+/5    Time 8    Period Weeks    Status  New    Target Date 06/07/21      PT LONG TERM GOAL #4   Title Patient will demonstrate improved function as evidenced by a score of 46 on FOTO measure for full participation in activities at home and in the community.    Baseline 04/12/21: FOTO - 34    Time 8    Period Weeks    Status New    Target Date  06/07/21                   Plan - 04/24/21 2007     Clinical Impression Statement Patient tolerated treatment well overall with no increase in pain by end of session. Focused on improving tissue tension and improving motion at the thoracic spine and initiated postural strengthening. Patient appears to be completing his HEP as prescribed but continues to have significant ROM limitations in the cervical spine. Would benefit from improved posture and thoracic mobility to help compensate for motion loss at cervical spine due to mult-level fusion (C2-T2). Also would benefit from UE strengthening as tolerated and within physician's precautions to regain optimal function. Patient would benefit from continued management of limiting condition by skilled physical therapist to address remaining impairments and functional limitations to work towards stated goals and return to PLOF or maximal functional independence.    Personal Factors and Comorbidities Comorbidity 3+    Comorbidities Diabete, Gout, High Cholesterol, HTN    Examination-Activity Limitations Bend;Carry;Dressing;Hygiene/Grooming;Lift    Examination-Participation Restrictions Laundry;Cleaning    Stability/Clinical Decision Making Stable/Uncomplicated    Rehab Potential Fair    PT Frequency 2x / week    PT Duration 8 weeks    PT Treatment/Interventions ADLs/Self Care Home Management;Cryotherapy;Electrical Stimulation;Moist Heat;Therapeutic activities;Therapeutic exercise;Manual techniques;Dry needling    PT Next Visit Plan Manual therapy to increase tissue restrictions and overall ROM, thoracic mobility, postural and UE strength as  tolerated/allowed by precautions    PT Home Exercise Plan Access Code: [email or web address]    Consulted and Agree with Plan of Care Patient             Patient will benefit from skilled therapeutic intervention in order to improve the following deficits and impairments:  Decreased activity tolerance, Decreased range of motion, Decreased strength, Hypomobility, Pain  Visit Diagnosis: Painful cervical ROM  Decreased range of motion of right shoulder  Decreased range of motion of left shoulder  Shoulder weakness     Problem List Patient Active Problem List   Diagnosis Date Noted   Other chronic pain 16/07/9603   Systolic murmur 54/06/8118   Tobacco use 10/26/2020   Polyp of ascending colon    Rectal polyp    Class 2 severe obesity with serious comorbidity and body mass index (BMI) of 39.0 to 39.9 in adult ([place]) 04/15/2019   Chronic kidney disease (CKD) stage G2/A3, mildly decreased glomerular filtration rate (GFR) between 60-89 mL/min/1.73 square meter and albuminuria creatinine ratio greater than 300 mg/g 04/15/2019   Proteinuria 04/15/2019   Hyperlipidemia associated with type 2 diabetes mellitus ([place]) 03/18/2019   Diabetes mellitus ([place]) 03/18/2019   Idiopathic chronic gout of multiple sites without tophus 03/18/2019   Benign essential hypertension 03/18/2019   Dermatitis 03/18/2019   Lumbar degenerative disc disease 11/22/2015   Cervical myelopathy ([place]) 11/22/2015   Osteoarthritis of spine with radiculopathy, lumbar region 11/22/2015   Everlean Alstrom. Graylon Good, PT, DPT 04/24/21, 8:16 PM   [facility] [address] [place], [address] Phone: [phone]   Fax:  [phone]  Name: Cory Rivas MRN: [id number] Date of Birth: Jan 02, 1966

## 2021-04-26 ENCOUNTER — Ambulatory Visit: Payer: Medicaid Other | Admitting: Physical Therapy

## 2021-04-26 ENCOUNTER — Encounter: Payer: Self-pay | Admitting: Physical Therapy

## 2021-04-26 DIAGNOSIS — M25611 Stiffness of right shoulder, not elsewhere classified: Secondary | ICD-10-CM

## 2021-04-26 DIAGNOSIS — M542 Cervicalgia: Secondary | ICD-10-CM

## 2021-04-26 DIAGNOSIS — M25612 Stiffness of left shoulder, not elsewhere classified: Secondary | ICD-10-CM

## 2021-04-26 DIAGNOSIS — R29898 Other symptoms and signs involving the musculoskeletal system: Secondary | ICD-10-CM

## 2021-04-26 NOTE — Therapy (Addendum)
[facility] [address], [place], [address] Phone: [phone]   Fax:  [phone]  Physical Therapy Treatment  Patient Details  Name: Cory Rivas MRN: [id number] Date of Birth: Mar 13, 1966 Referring Provider (PT): Gara Kroner, MD   Encounter Date: 04/26/2021   PT End of Session - 04/26/21 1713     Visit Number 4    Number of Visits 17    Date for PT Re-Evaluation 06/07/21    Authorization Type [place] MEDICAID HEALTHY BLUE reporting period from 04/12/2021    Authorization Time Period ref [id number] 7/6-8/31 16 PT visits(will not cover 97012 mechanical traction )    Authorization - Visit Number 3    Authorization - Number of Visits 16    Progress Note Due on Visit 10    PT Start Time 1603    PT Stop Time 1643    PT Time Calculation (min) 40 min    Activity Tolerance Patient tolerated treatment well;Patient limited by pain    Behavior During Therapy [facility] for tasks assessed/performed             Past Medical History:  Diagnosis Date   Bulging of cervical intervertebral disc    Diabetes mellitus without complication ([place])    Gout    High cholesterol    Hypertension    Stress due to family tension 04/15/2019    Past Surgical History:  Procedure Laterality Date   CHOLECYSTECTOMY     COLONOSCOPY WITH PROPOFOL N/A 03/10/2020   Procedure: COLONOSCOPY WITH PROPOFOL;  Surgeon: Lucilla Lame, MD;  Location: [facility] ENDOSCOPY;  Service: Endoscopy;  Laterality: N/A;    There were no vitals filed for this visit.   Subjective Assessment - 04/26/21 1710     Subjective Patient arrives to clinic with incresed stiffness through upper back, shoulders, cervical spine and neck. He states that the stiffness reaches up into the back of his head and "it feels almost like a headache." Despite stiffness patient reports 0/10 pain. The recent increase in stiffness has made sleeping more challenging for the patient.  Patient reports not being able to go to bed until 1 am due to stiffness and inability to get comfortable. Patient believes stiffness might have occured after lifting a milk jug over head. Patient described experiencing a shooting pain down his L arm when lifting the jug. Patient continues to report numbness and tingling in 2nd and 3rd digit. Patient states that numbness in his fingers makes it hard to grap/hold on to items including utensils at times. Patient reports HEP compliance.    Patient Stated Goals Increase ROM of cervical spine    Currently in Pain? No/denies    Pain Onset More than a month ago              TREATMENT:  MD visit 02/16/2021: "He will follow postoperative guidelines of no heavy lifting, bending, and twisting. I have advised the patient to lift up to 10 pounds until 6 weeks after surgery, at which time his restrictions will be readdressed."   Manual Therapy: to increase joint mobility, support increased overall ROM, and provide pain relief to joint musculature. POSITIONED IN MASSAGE CHAIR - STM to cervical paraspinals, bil SCM, bil LS, bil UT, suboccipitals   Therapeutic Exercise: to address strength, endurance, and ROM deficits for improved pain-free independence with all functional mobility.  - Seated cervical flexion/extension AROM, x20 - cervical SNAG rotation, 5x10s bil - Pec stretch in doorway. 5x10s  bil - Standing cervical thoracic extension/BUE flexion and serratus anterior activation, lat stretch, with foam roller up wall, 5 second holds. 1x10 (feels good at  upper back and lower back). - standing scapular rows 1x10 at cable with 10# stack   Pt required multimodal cuing for proper technique and to facilitate improved neuromuscular control, strength, range of motion, and functional ability resulting in improved performance and form.)  HOME EXERCISE PROGRAM  Access Code: [email or web address] URL: [email or web address] Date: 04/24/2021 Prepared by: Rosita Kea   Exercises Seated Cervical Flexion AROM - 1 x daily - 7 x weekly - 3 sets - 10 reps Seated Cervical Extension AROM - 1 x daily - 7 x weekly - 3 sets - 10 reps Seated Cervical Sidebending AROM - 1 x daily - 7 x weekly - 3 sets - 10 reps Seated Cervical Rotation AROM - 1 x daily - 7 x weekly - 3 sets - 10 reps Seated Cervical Retraction and Extension - 1 x daily - 7 x weekly - 3 sets - 10 reps Seated Cervical Retraction - 1 x daily - 7 x weekly - 3 sets - 10 reps          PT Education - 04/26/21 1712     Education Details Exercise purpose/form. Self management techniques    Person(s) Educated Patient    Methods Demonstration;Tactile cues;Verbal cues;Explanation    Comprehension Returned demonstration;Tactile cues required;Verbalized understanding;Verbal cues required              PT Short Term Goals - 04/26/21 1749       PT SHORT TERM GOAL #1   Title Pt will be independent with HEP in order to improve strength and decrease back pain in order to improve pain-free function at home and work.    Baseline Given HEP today.    Time 2    Period Weeks    Status Achieved    Target Date 04/26/21               PT Long Term Goals - 04/12/21 1554       PT LONG TERM GOAL #1   Title Pt will demonstrate decrease in NDI by at least 19% in order to demonstrate clinically significant reduction in disability related to neck injury/pain    Baseline 04/12/21: 70%    Time 8    Period Weeks    Status New    Target Date 06/07/21      PT LONG TERM GOAL #2   Title Pt will decrease worst neck pain as reported on NPRS by at least 2 points in order to demonstrate clinically significant reduction in back pain.    Baseline 04/12/21: Worst Pain: 9/10    Time 8    Period Weeks    Status New    Target Date 06/07/21      PT LONG TERM GOAL #3   Title Pt will increase strength of by at least 1/2 MMT grade in order to demonstrate improvement in strength and function.    Baseline  04/12/21: B UE General MMT: 4+/5    Time 8    Period Weeks    Status New    Target Date 06/07/21      PT LONG TERM GOAL #4   Title Patient will demonstrate improved function as evidenced by a score of 46 on FOTO measure for full participation in activities at home and in the community.    Baseline 04/12/21: FOTO - 34  Time 8    Period Weeks    Status New    Target Date 06/07/21                   Plan - 04/26/21 1731     Clinical Impression Statement Patient tolerated treatment well overall with no complaints of pain or discomfort. Session focused on addressing patient cervial and thoracic spine immobility and impaired posture to improve functional mobility post surgery. Patient continues to have significant cervical ROM limitations and experiences numbness and tingling that radiates into the 2nd and 3rd digits of his right finger. However, manual work and theraputic stretches continue to decrease patient stiffness and discomfort. Patient row exercise was progressed to 10lbs. Increased resistance did not evoke symptoms indicating increased activity tolerance and reduction in disability due to neck pain/injury. Plan to continue strengthing postural muscle and improve patient cervical and thoracic ROM ast subsequent sessions. Patient will continue to benefit from skilled physical therapy to address impairments and maximize functional mobility.    Personal Factors and Comorbidities Comorbidity 3+    Comorbidities Diabete, Gout, High Cholesterol, HTN    Examination-Activity Limitations Bend;Carry;Dressing;Hygiene/Grooming;Lift    Examination-Participation Restrictions Laundry;Cleaning    Stability/Clinical Decision Making Stable/Uncomplicated    Rehab Potential Fair    PT Frequency 2x / week    PT Duration 8 weeks    PT Treatment/Interventions ADLs/Self Care Home Management;Cryotherapy;Electrical Stimulation;Moist Heat;Therapeutic activities;Therapeutic exercise;Manual techniques;Dry  needling    PT Next Visit Plan Manual therapy to increase tissue restrictions and overall ROM, thoracic mobility, postural and UE strength as tolerated/allowed by precautions    PT Home Exercise Plan Access Code: [place] and Agree with Plan of Care Patient             Patient will benefit from skilled therapeutic intervention in order to improve the following deficits and impairments:  Decreased activity tolerance, Decreased range of motion, Decreased strength, Hypomobility, Pain  Visit Diagnosis: Shoulder weakness  Painful cervical ROM  Decreased range of motion of left shoulder  Decreased range of motion of right shoulder     Problem List Patient Active Problem List   Diagnosis Date Noted   Other chronic pain 64/40/3474   Systolic murmur 25/95/6387   Tobacco use 10/26/2020   Polyp of ascending colon    Rectal polyp    Class 2 severe obesity with serious comorbidity and body mass index (BMI) of 39.0 to 39.9 in adult ([place]) 04/15/2019   Chronic kidney disease (CKD) stage G2/A3, mildly decreased glomerular filtration rate (GFR) between 60-89 mL/min/1.73 square meter and albuminuria creatinine ratio greater than 300 mg/g 04/15/2019   Proteinuria 04/15/2019   Hyperlipidemia associated with type 2 diabetes mellitus ([place]) 03/18/2019   Diabetes mellitus ([place]) 03/18/2019   Idiopathic chronic gout of multiple sites without tophus 03/18/2019   Benign essential hypertension 03/18/2019   Dermatitis 03/18/2019   Lumbar degenerative disc disease 11/22/2015   Cervical myelopathy ([place]) 11/22/2015   Osteoarthritis of spine with radiculopathy, lumbar region 11/22/2015   Sherryll Burger, SPT  Student physical therapist under direct supervision of licensed physical therapists during the entirety of the session.   Everlean Alstrom. Graylon Good, PT, DPT 04/26/21, 5:54 PM   [facility] [address], [place],  [address] Phone: [phone]   Fax:  [phone]  Name: Cory Rivas MRN: [id number] Date of Birth: Mar 17, 1966

## 2021-04-27 ENCOUNTER — Other Ambulatory Visit: Payer: Self-pay | Admitting: Family Medicine

## 2021-04-27 DIAGNOSIS — M5136 Other intervertebral disc degeneration, lumbar region: Secondary | ICD-10-CM

## 2021-04-27 DIAGNOSIS — M5412 Radiculopathy, cervical region: Secondary | ICD-10-CM

## 2021-04-27 DIAGNOSIS — M549 Dorsalgia, unspecified: Secondary | ICD-10-CM

## 2021-04-27 DIAGNOSIS — M51369 Other intervertebral disc degeneration, lumbar region without mention of lumbar back pain or lower extremity pain: Secondary | ICD-10-CM

## 2021-04-27 DIAGNOSIS — G8929 Other chronic pain: Secondary | ICD-10-CM

## 2021-05-01 ENCOUNTER — Encounter: Payer: Self-pay | Admitting: Physical Therapy

## 2021-05-01 ENCOUNTER — Ambulatory Visit: Payer: Medicaid Other | Admitting: Physical Therapy

## 2021-05-01 DIAGNOSIS — M25612 Stiffness of left shoulder, not elsewhere classified: Secondary | ICD-10-CM

## 2021-05-01 DIAGNOSIS — R29898 Other symptoms and signs involving the musculoskeletal system: Secondary | ICD-10-CM

## 2021-05-01 DIAGNOSIS — M542 Cervicalgia: Secondary | ICD-10-CM | POA: Diagnosis not present

## 2021-05-01 DIAGNOSIS — M25611 Stiffness of right shoulder, not elsewhere classified: Secondary | ICD-10-CM | POA: Diagnosis not present

## 2021-05-01 NOTE — Therapy (Signed)
[facility] [address] [place], [place], [address] Phone: [phone]   Fax:  [phone]  Physical Therapy Treatment  Patient Details  Name: Cory Rivas MRN: [id number] Date of Birth: [birth year] Referring Provider (PT): Gara Kroner, MD   Encounter Date: 05/01/2021   PT End of Session - 05/01/21 1549     Visit Number 5    Number of Visits 17    Date for PT Re-Evaluation 06/07/21    Authorization Type [place] MEDICAID HEALTHY BLUE reporting period from 04/12/2021    Authorization Time Period ref [id number] 7/6-8/31 16 PT visits(will not cover 97012 mechanical traction )    Authorization - Visit Number 4    Authorization - Number of Visits 16    Progress Note Due on Visit 10    PT Start Time 9937    PT Stop Time 1513    PT Time Calculation (min) 40 min    Activity Tolerance Patient tolerated treatment well;Patient limited by pain    Behavior During Therapy [facility] for tasks assessed/performed             Past Medical History:  Diagnosis Date   Bulging of cervical intervertebral disc    Diabetes mellitus without complication ([place])    Gout    High cholesterol    Hypertension    Stress due to family tension 04/15/2019    Past Surgical History:  Procedure Laterality Date   CHOLECYSTECTOMY     COLONOSCOPY WITH PROPOFOL N/A 03/10/2020   Procedure: COLONOSCOPY WITH PROPOFOL;  Surgeon: Lucilla Lame, MD;  Location: [facility] ENDOSCOPY;  Service: Endoscopy;  Laterality: N/A;    There were no vitals filed for this visit.   Subjective Assessment - 05/01/21 1546     Subjective Patient is experiencing numbness in back of head/neck near incision site, but  is not in any pain today. After the last session patient recalls posterior shoulder soreness. Patient states that they have been "feeling lazy" and reports decreased HEP compliance since last appointment. Patient is still having difficulty sleeping due to increased  stiffness. However, patient states that utilizing heating pad for 15 minute increments can help reduce his stiffness.    Patient Stated Goals Increase ROM of cervical spine    Currently in Pain? No/denies    Pain Score 0-No pain    Pain Onset More than a month ago    Pain Relieving Factors heating pad             TREATMENT:  Manual Therapy: to increase joint mobility, support increased overall ROM, and provide pain relief to joint musculature. POSITIONED IN MASSAGE CHAIR - STM to cervical paraspinals, bil SCM, bil LS, bil UT, suboccipitals   Therapeutic Exercise: to address strength, endurance, and ROM deficits for improved pain-free independence with all functional mobility. - Pec stretch in doorway. 5x10s bil - standing single arm scapular rows 1x10 bilaterally at cable with 10# stack (multimodal cues to retract shoulder blade and square shoulders off before performing exercise) - standing scapular rows 1x10 at cable with 10# stack - seated shoulder press 1x7 with 5lb weight in each hand - seated one armed shoulder press 1x7 with 5lb weight in R hand, 1x3 with 5lb weight in L hand. (Performed one armed shoulder press on 2nd round of shoulder press exercise due to fatigue and to improve movement quality).    Pt required multimodal cuing for proper technique and to facilitate improved neuromuscular control, strength, range  of motion, and functional ability resulting in improved performance and form.)   HOME EXERCISE PROGRAM  Access Code: [email or web address] URL: [email or web address] Date: 04/24/2021 Prepared by: Rosita Kea   Exercises Seated Cervical Flexion AROM - 1 x daily - 7 x weekly - 3 sets - 10 reps Seated Cervical Extension AROM - 1 x daily - 7 x weekly - 3 sets - 10 reps Seated Cervical Sidebending AROM - 1 x daily - 7 x weekly - 3 sets - 10 reps Seated Cervical Rotation AROM - 1 x daily - 7 x weekly - 3 sets - 10 reps Seated Cervical Retraction and Extension - 1  x daily - 7 x weekly - 3 sets - 10 reps Seated Cervical Retraction - 1 x daily - 7 x weekly - 3 sets - 10 reps     PT Education - 05/01/21 1548     Education Details Exercise purpose/form. Self management techniques    Person(s) Educated Patient    Methods Explanation;Demonstration;Tactile cues;Verbal cues    Comprehension Returned demonstration;Verbalized understanding;Verbal cues required;Tactile cues required              PT Short Term Goals - 04/26/21 1749       PT SHORT TERM GOAL #1   Title Pt will be independent with HEP in order to improve strength and decrease back pain in order to improve pain-free function at home and work.    Baseline Given HEP today.    Time 2    Period Weeks    Status Achieved    Target Date 04/26/21               PT Long Term Goals - 04/12/21 1554       PT LONG TERM GOAL #1   Title Pt will demonstrate decrease in NDI by at least 19% in order to demonstrate clinically significant reduction in disability related to neck injury/pain    Baseline 04/12/21: 70%    Time 8    Period Weeks    Status New    Target Date 06/07/21      PT LONG TERM GOAL #2   Title Pt will decrease worst neck pain as reported on NPRS by at least 2 points in order to demonstrate clinically significant reduction in back pain.    Baseline 04/12/21: Worst Pain: 9/10    Time 8    Period Weeks    Status New    Target Date 06/07/21      PT LONG TERM GOAL #3   Title Pt will increase strength of by at least 1/2 MMT grade in order to demonstrate improvement in strength and function.    Baseline 04/12/21: B UE General MMT: 4+/5    Time 8    Period Weeks    Status New    Target Date 06/07/21      PT LONG TERM GOAL #4   Title Patient will demonstrate improved function as evidenced by a score of 46 on FOTO measure for full participation in activities at home and in the community.    Baseline 04/12/21: FOTO - 34    Time 8    Period Weeks    Status New    Target Date  06/07/21                   Plan - 05/01/21 1658     Clinical Impression Statement Patient tolerated treatment well with no lasting complaints of pain or discomfort. Session focused on  strengtheing UE musculature to improve patient posture, and cervical/thoracic mobility post surgery. Patient continue to demonstrate decreaseed cervical/thoracic mobility, but continues to report increased cervical ROM and decreased stiffness post treatment. UE strength deficits limit patient ability to perform ADLs with cervical/thoracic ROM limmitations. Further emphasis on UE strengthening will improve patient functional mobility and ability to compensation ROM changes post surgery. benefit from skilled physical therapy to address impairments and maximize functional mobility.    Personal Factors and Comorbidities Comorbidity 3+    Comorbidities Diabete, Gout, High Cholesterol, HTN    Examination-Activity Limitations Bend;Carry;Dressing;Hygiene/Grooming;Lift    Examination-Participation Restrictions Laundry;Cleaning    Stability/Clinical Decision Making Stable/Uncomplicated    Rehab Potential Fair    PT Frequency 2x / week    PT Duration 8 weeks    PT Treatment/Interventions ADLs/Self Care Home Management;Cryotherapy;Electrical Stimulation;Moist Heat;Therapeutic activities;Therapeutic exercise;Manual techniques;Dry needling    PT Next Visit Plan Manual therapy to increase tissue restrictions and overall ROM, thoracic mobility, postural and UE strength as tolerated/allowed by precautions    PT Home Exercise Plan Access Code: [place] and Agree with Plan of Care Patient             Patient will benefit from skilled therapeutic intervention in order to improve the following deficits and impairments:  Decreased activity tolerance, Decreased range of motion, Decreased strength, Hypomobility, Pain  Visit Diagnosis: Shoulder weakness  Painful cervical ROM  Decreased range of motion of  left shoulder  Decreased range of motion of right shoulder     Problem List Patient Active Problem List   Diagnosis Date Noted   Other chronic pain 69/79/4801   Systolic murmur 65/53/7482   Tobacco use 10/26/2020   Polyp of ascending colon    Rectal polyp    Class 2 severe obesity with serious comorbidity and body mass index (BMI) of 39.0 to 39.9 in adult ([place]) 04/15/2019   Chronic kidney disease (CKD) stage G2/A3, mildly decreased glomerular filtration rate (GFR) between 60-89 mL/min/1.73 square meter and albuminuria creatinine ratio greater than 300 mg/g 04/15/2019   Proteinuria 04/15/2019   Hyperlipidemia associated with type 2 diabetes mellitus ([place]) 03/18/2019   Diabetes mellitus ([place]) 03/18/2019   Idiopathic chronic gout of multiple sites without tophus 03/18/2019   Benign essential hypertension 03/18/2019   Dermatitis 03/18/2019   Lumbar degenerative disc disease 11/22/2015   Cervical myelopathy ([place]) 11/22/2015   Osteoarthritis of spine with radiculopathy, lumbar region 11/22/2015   Sherryll Burger, SPT  Student physical therapist under direct supervision of licensed physical therapists during the entirety of the session.   Everlean Alstrom. Graylon Good, PT, DPT 05/01/21, 5:11 PM   [facility] [address], [place], [address] Phone: [phone]   Fax:  [phone]  Name: Cory Rivas MRN: [id number] Date of Birth: [birth year]

## 2021-05-03 ENCOUNTER — Ambulatory Visit: Payer: Medicaid Other | Admitting: Physical Therapy

## 2021-05-03 ENCOUNTER — Encounter: Payer: Self-pay | Admitting: Physical Therapy

## 2021-05-03 DIAGNOSIS — M542 Cervicalgia: Secondary | ICD-10-CM

## 2021-05-03 DIAGNOSIS — R29898 Other symptoms and signs involving the musculoskeletal system: Secondary | ICD-10-CM | POA: Diagnosis not present

## 2021-05-03 DIAGNOSIS — M25612 Stiffness of left shoulder, not elsewhere classified: Secondary | ICD-10-CM

## 2021-05-03 DIAGNOSIS — M25611 Stiffness of right shoulder, not elsewhere classified: Secondary | ICD-10-CM | POA: Diagnosis not present

## 2021-05-03 NOTE — Therapy (Signed)
[place] [facility] [address] [place], [place], [address] Phone: [phone]   Fax:  [phone]  Physical Therapy Treatment  Patient Details  Name: Cory Rivas MRN: [id number] Date of Birth: [birth year] Referring Provider (PT): Gara Kroner, MD   Encounter Date: 05/03/2021   PT End of Session - 05/03/21 1543     Visit Number 6    Number of Visits 17    Date for PT Re-Evaluation 06/07/21    Authorization Type [place] MEDICAID HEALTHY BLUE reporting period from 04/12/2021    Authorization Time Period ref [id number] 7/6-8/31 16 PT visits(will not cover 97012 mechanical traction )    Authorization - Visit Number 5    Authorization - Number of Visits 16    Progress Note Due on Visit 10    PT Start Time 4401    PT Stop Time 1517    PT Time Calculation (min) 40 min    Activity Tolerance Patient tolerated treatment well    Behavior During Therapy [facility] for tasks assessed/performed             Past Medical History:  Diagnosis Date   Bulging of cervical intervertebral disc    Diabetes mellitus without complication ([place])    Gout    High cholesterol    Hypertension    Stress due to family tension 04/15/2019    Past Surgical History:  Procedure Laterality Date   CHOLECYSTECTOMY     COLONOSCOPY WITH PROPOFOL N/A 03/10/2020   Procedure: COLONOSCOPY WITH PROPOFOL;  Surgeon: Lucilla Lame, MD;  Location: [facility] ENDOSCOPY;  Service: Endoscopy;  Laterality: N/A;    There were no vitals filed for this visit.  Subjective Assessment - 05/03/21 1528     Subjective Patient is "doing pretty well. Patient still reports numbness in fingers. Patient reports no changes since last appointment. Patient is HEP compliant. Patient believes he is moving better since starting PT.    Patient Stated Goals Increase ROM of cervical spine    Currently in Pain? No/denies    Pain Score 0-No pain    Pain Onset More than a month ago              TREATMENT:   Manual Therapy: to increase joint mobility, support increased overall ROM, and provide pain relief to joint musculature. - STM to cervical paraspinals, bil SCM, bil LS, bil UT, suboccipitals. Performed with patient in supine. - Cervical mobilizations, grade I-II left OA joint, 1x20 seconds (reports pull in left upper trap).    Therapeutic Exercise: to address strength, endurance, and ROM deficits for improved pain-free independence with all functional mobility - supine cervical rotation to L and R  with contract/relax technique to improve ROM. 3x each direction with 6 second resistive contraction  - supine cervical rotation to L and R with deflated ball supporting head 1 x10 each direction with 5 second hold - supine cervical flexion with deflated ball behind supporting head 1x10 with 5 second hold - seated one armed shoulder press 3x12 with 5lb weight in R hand, 3x9 with 5lb weight in L hand.  - standing scapular rows 1x10 at cable with 10# stack, 2x10 with 15# (cues to keep wrist straight)   Pt required multimodal cuing for proper technique and to facilitate improved neuromuscular control, strength, range of motion, and functional ability resulting in improved performance and form.)   HOME EXERCISE PROGRAM  Access Code: [email or web address] URL: [email or web address] Date: 04/24/2021 Prepared by: Rosita Kea  Exercises Seated Cervical Flexion AROM - 1 x daily - 7 x weekly - 3 sets - 10 reps Seated Cervical Extension AROM - 1 x daily - 7 x weekly - 3 sets - 10 reps Seated Cervical Sidebending AROM - 1 x daily - 7 x weekly - 3 sets - 10 reps Seated Cervical Rotation AROM - 1 x daily - 7 x weekly - 3 sets - 10 reps Seated Cervical Retraction and Extension - 1 x daily - 7 x weekly - 3 sets - 10 reps Seated Cervical Retraction - 1 x daily - 7 x weekly - 3 sets - 10 reps     PT Education - 05/03/21 1542     Education Details Exercise purpose/form. Self management  techniques    Person(s) Educated Patient    Methods Explanation;Demonstration;Tactile cues;Verbal cues    Comprehension Verbalized understanding;Returned demonstration;Tactile cues required;Verbal cues required              PT Short Term Goals - 04/26/21 1749       PT SHORT TERM GOAL #1   Title Pt will be independent with HEP in order to improve strength and decrease back pain in order to improve pain-free function at home and work.    Baseline Given HEP today.    Time 2    Period Weeks    Status Achieved    Target Date 04/26/21               PT Long Term Goals - 04/12/21 1554       PT LONG TERM GOAL #1   Title Pt will demonstrate decrease in NDI by at least 19% in order to demonstrate clinically significant reduction in disability related to neck injury/pain    Baseline 04/12/21: 70%    Time 8    Period Weeks    Status New    Target Date 06/07/21      PT LONG TERM GOAL #2   Title Pt will decrease worst neck pain as reported on NPRS by at least 2 points in order to demonstrate clinically significant reduction in back pain.    Baseline 04/12/21: Worst Pain: 9/10    Time 8    Period Weeks    Status New    Target Date 06/07/21      PT LONG TERM GOAL #3   Title Pt will increase strength of by at least 1/2 MMT grade in order to demonstrate improvement in strength and function.    Baseline 04/12/21: B UE General MMT: 4+/5    Time 8    Period Weeks    Status New    Target Date 06/07/21      PT LONG TERM GOAL #4   Title Patient will demonstrate improved function as evidenced by a score of 46 on FOTO measure for full participation in activities at home and in the community.    Baseline 04/12/21: FOTO - 34    Time 8    Period Weeks    Status New    Target Date 06/07/21                   Plan - 05/03/21 1547     Clinical Impression Statement Patient tolerated treatment well with no lasting complaints of discomfort or fatigue. Patient cervical ROM remains  limited with R cervical rotation more limited than L cervical rotation. Patient continues to display kyphotic posture and stiffness through thoracic spine which limit bilateral UE function post fusion  as well. Patient progressed to 15lb weighted rows, however UE strength remains limited. Plan to examine patient UE neural tension in order to better understand its impact on patient POC and UE function at next session. Patient will continue to benefit from skilled phyiscal therapy to address impairments and maximize functional mobility.    Personal Factors and Comorbidities Comorbidity 3+    Comorbidities Diabete, Gout, High Cholesterol, HTN    Examination-Activity Limitations Bend;Carry;Dressing;Hygiene/Grooming;Lift    Examination-Participation Restrictions Laundry;Cleaning    Stability/Clinical Decision Making Stable/Uncomplicated    Rehab Potential Fair    PT Frequency 2x / week    PT Duration 8 weeks    PT Treatment/Interventions ADLs/Self Care Home Management;Cryotherapy;Electrical Stimulation;Moist Heat;Therapeutic activities;Therapeutic exercise;Manual techniques;Dry needling    PT Next Visit Plan Manual therapy to increase tissue restrictions and overall ROM, thoracic mobility, postural and UE strength as tolerated/allowed by precautions    PT Home Exercise Plan Access Code: [place] and Agree with Plan of Care Patient             Patient will benefit from skilled therapeutic intervention in order to improve the following deficits and impairments:  Decreased activity tolerance, Decreased range of motion, Decreased strength, Hypomobility, Pain  Visit Diagnosis: Shoulder weakness  Painful cervical ROM  Decreased range of motion of right shoulder  Decreased range of motion of left shoulder     Problem List Patient Active Problem List   Diagnosis Date Noted   Other chronic pain 02/54/2706   Systolic murmur 23/76/2831   Tobacco use 10/26/2020   Polyp of ascending  colon    Rectal polyp    Class 2 severe obesity with serious comorbidity and body mass index (BMI) of 39.0 to 39.9 in adult ([place]) 04/15/2019   Chronic kidney disease (CKD) stage G2/A3, mildly decreased glomerular filtration rate (GFR) between 60-89 mL/min/1.73 square meter and albuminuria creatinine ratio greater than 300 mg/g 04/15/2019   Proteinuria 04/15/2019   Hyperlipidemia associated with type 2 diabetes mellitus ([place]) 03/18/2019   Diabetes mellitus ([place]) 03/18/2019   Idiopathic chronic gout of multiple sites without tophus 03/18/2019   Benign essential hypertension 03/18/2019   Dermatitis 03/18/2019   Lumbar degenerative disc disease 11/22/2015   Cervical myelopathy ([place]) 11/22/2015   Osteoarthritis of spine with radiculopathy, lumbar region 11/22/2015   Sherryll Burger, SPT  Student physical therapist under direct supervision of licensed physical therapists during the entirety of the session.   Everlean Alstrom. Graylon Good, PT, DPT 05/03/21, 4:32 PM   [facility] [address] [place], [address] Phone: [phone]   Fax:  [phone]  Name: Cory Rivas MRN: [id number] Date of Birth: 23-Jun-1966

## 2021-05-08 ENCOUNTER — Encounter: Payer: Self-pay | Admitting: Physical Therapy

## 2021-05-08 ENCOUNTER — Ambulatory Visit: Payer: Medicaid Other | Admitting: Physical Therapy

## 2021-05-08 ENCOUNTER — Telehealth: Payer: Self-pay | Admitting: Physical Therapy

## 2021-05-08 DIAGNOSIS — M25611 Stiffness of right shoulder, not elsewhere classified: Secondary | ICD-10-CM

## 2021-05-08 DIAGNOSIS — R29898 Other symptoms and signs involving the musculoskeletal system: Secondary | ICD-10-CM

## 2021-05-08 DIAGNOSIS — M542 Cervicalgia: Secondary | ICD-10-CM

## 2021-05-08 DIAGNOSIS — M25612 Stiffness of left shoulder, not elsewhere classified: Secondary | ICD-10-CM

## 2021-05-08 NOTE — Patient Instructions (Addendum)
  Medbridge: Access Code: [email or web address] URL: [email or web address] Date: 05/08/2021 Prepared by: Rosita Kea  Exercises Seated Cervical Flexion AROM - 1 x daily - 7 x weekly - 3 sets - 10 reps Seated Cervical Extension AROM - 1 x daily - 7 x weekly - 3 sets - 10 reps Seated Cervical Sidebending AROM - 1 x daily - 7 x weekly - 3 sets - 10 reps Seated Cervical Rotation AROM - 1 x daily - 7 x weekly - 3 sets - 10 reps Seated Cervical Retraction and Extension - 1 x daily - 7 x weekly - 3 sets - 10 reps Seated Cervical Retraction - 1 x daily - 7 x weekly - 3 sets - 10 reps Standing Ulnar Nerve Glide - 1 x daily - 1 sets - 15 reps - 2-3 seconds hold Standing Median Nerve Glide - 1 x daily - 1 sets - 15 reps - 2-3 seconds hold

## 2021-05-08 NOTE — Telephone Encounter (Signed)
Called Dr. Era Bumpers (patient's cervical spine surgeon) at [facility] [phone]). Left message at nurse triage line asking if Dr. Aris Lot has any specific activity restrictions/precautions/protocol that he would like followed during physical therapy. Requested a call back and left clinic phone and fax number.   Everlean Alstrom. Graylon Good, PT, DPT 05/08/21, 2:59 PM

## 2021-05-08 NOTE — Therapy (Signed)
[facility] [address], [place], [address] Phone: [phone]   Fax:  [phone]  Physical Therapy Treatment  Patient Details  Name: MAXIMILIEN HAYASHI MRN: [id number] Date of Birth: 1966-05-04 Referring Provider (PT): Gara Kroner, MD   Encounter Date: 05/08/2021   PT End of Session - 05/08/21 1508     Visit Number 7    Number of Visits 17    Date for PT Re-Evaluation 06/07/21    Authorization Type [place] MEDICAID HEALTHY BLUE reporting period from 04/12/2021    Authorization Time Period ref [id number] 7/6-8/31 16 PT visits(will not cover [address]012 mechanical traction )    Authorization - Visit Number 6    Authorization - Number of Visits 16    Progress Note Due on Visit 10    PT Start Time 1350    PT Stop Time 1435    PT Time Calculation (min) 45 min    Activity Tolerance Patient tolerated treatment well    Behavior During Therapy [facility] for tasks assessed/performed             Past Medical History:  Diagnosis Date   Bulging of cervical intervertebral disc    Diabetes mellitus without complication ([employer])    Gout    High cholesterol    Hypertension    Stress due to family tension 04/15/2019    Past Surgical History:  Procedure Laterality Date   CHOLECYSTECTOMY     COLONOSCOPY WITH PROPOFOL N/A 03/10/2020   Procedure: COLONOSCOPY WITH PROPOFOL;  Surgeon: Lucilla Lame, MD;  Location: [facility] ENDOSCOPY;  Service: Endoscopy;  Laterality: N/A;    There were no vitals filed for this visit.   OBJECTIVE:  ULTT - Median nerve bias: positive bilaterally. Increased tension in R UE compared to L UE ULTT - Ulnar nerve bias: positive bilaterally. Increased tension in R UE compared to L UE ULTT - Radial nerve bias: positive bilaterally. Increased tension in R UE compared to L UE  TREATMENT:  Therapeutic exercise: to centralize symptoms and improve ROM, strength, muscular endurance, and activity tolerance  required for successful completion of functional activities. - Objective measurements completed on examination: See above findings.   - seated ulnar, radial, and median nerve glides 1x15 each with 2 second hold performed bilaterally.  - standing one armed shoulder press 3x10 with 5lb weight in R hand. 1x15, 2x10 with 5lb weight in L hand. - Education on HEP including handout     Pt required multimodal cuing for proper technique and to facilitate improved neuromuscular control, strength, range of motion, and functional ability resulting in improved performance and form.)   Access Code: [email or web address] URL: [email or web address] Date: 05/08/2021 Prepared by: Rosita Kea  Exercises Seated Cervical Flexion AROM - 1 x daily - 7 x weekly - 3 sets - 10 reps Seated Cervical Extension AROM - 1 x daily - 7 x weekly - 3 sets - 10 reps Seated Cervical Sidebending AROM - 1 x daily - 7 x weekly - 3 sets - 10 reps Seated Cervical Rotation AROM - 1 x daily - 7 x weekly - 3 sets - 10 reps Seated Cervical Retraction and Extension - 1 x daily - 7 x weekly - 3 sets - 10 reps Seated Cervical Retraction - 1 x daily - 7 x weekly - 3 sets - 10 reps Standing Ulnar Nerve Glide - 1 x daily - 1 sets - 15 reps - 2-3 seconds hold Standing Median Nerve Glide -  1 x daily - 1 sets - 15 reps - 2-3 seconds hold Standing Radial Nerve Glide - 1 x daily - 1 sets - 15 reps - 2-3 seconds hold     PT Short Term Goals - 04/26/21 1749       PT SHORT TERM GOAL #1   Title Pt will be independent with HEP in order to improve strength and decrease back pain in order to improve pain-free function at home and work.    Baseline Given HEP today.    Time 2    Period Weeks    Status Achieved    Target Date 04/26/21               PT Long Term Goals - 04/12/21 1554       PT LONG TERM GOAL #1   Title Pt will demonstrate decrease in NDI by at least 19% in order to demonstrate clinically significant reduction in  disability related to neck injury/pain    Baseline 04/12/21: 70%    Time 8    Period Weeks    Status New    Target Date 06/07/21      PT LONG TERM GOAL #2   Title Pt will decrease worst neck pain as reported on NPRS by at least 2 points in order to demonstrate clinically significant reduction in back pain.    Baseline 04/12/21: Worst Pain: 9/10    Time 8    Period Weeks    Status New    Target Date 06/07/21      PT LONG TERM GOAL #3   Title Pt will increase strength of by at least 1/2 MMT grade in order to demonstrate improvement in strength and function.    Baseline 04/12/21: B UE General MMT: 4+/5    Time 8    Period Weeks    Status New    Target Date 06/07/21      PT LONG TERM GOAL #4   Title Patient will demonstrate improved function as evidenced by a score of 46 on FOTO measure for full participation in activities at home and in the community.    Baseline 04/12/21: FOTO - 34    Time 8    Period Weeks    Status New    Target Date 06/07/21              Plan - 05/08/21 1509     Clinical Impression Statement Patient tolerated treatment well with lasting complaints of discomfort. Today's session included nerve tension assesment and neural mobility in order to address patient complaints of numbness in R fingers. Patient finger numbness limits patient UE function, strength and to perform ADLs post fusion. Patient demonstrated improvements in UE strength as evidence by improvements in form while performing exercises. However, patient UE strength, espcially L UE strength limits patient UE function. Patient will continue to benefit from skilled physical therapy in order to address impairments and maximize functional mobility.    Personal Factors and Comorbidities Comorbidity 3+    Comorbidities Diabete, Gout, High Cholesterol, HTN    Examination-Activity Limitations Bend;Carry;Dressing;Hygiene/Grooming;Lift    Examination-Participation Restrictions Laundry;Cleaning     Stability/Clinical Decision Making Stable/Uncomplicated    Rehab Potential Fair    PT Frequency 2x / week    PT Duration 8 weeks    PT Treatment/Interventions ADLs/Self Care Home Management;Cryotherapy;Electrical Stimulation;Moist Heat;Therapeutic activities;Therapeutic exercise;Manual techniques;Dry needling    PT Next Visit Plan Manual therapy to increase tissue restrictions and overall ROM, thoracic mobility, postural and  UE strength as tolerated/allowed by precautions    PT Home Exercise Plan Access Code: [email or web address]    Consulted and Agree with Plan of Care Patient             Patient will benefit from skilled therapeutic intervention in order to improve the following deficits and impairments:  Decreased activity tolerance, Decreased range of motion, Decreased strength, Hypomobility, Pain  Visit Diagnosis: Shoulder weakness  Painful cervical ROM  Decreased range of motion of left shoulder  Decreased range of motion of right shoulder     Problem List Patient Active Problem List   Diagnosis Date Noted   Other chronic pain 82/95/6213   Systolic murmur 08/65/7846   Tobacco use 10/26/2020   Polyp of ascending colon    Rectal polyp    Class 2 severe obesity with serious comorbidity and body mass index (BMI) of 39.0 to 39.9 in adult ([place]) 04/15/2019   Chronic kidney disease (CKD) stage G2/A3, mildly decreased glomerular filtration rate (GFR) between 60-89 mL/min/1.73 square meter and albuminuria creatinine ratio greater than 300 mg/g 04/15/2019   Proteinuria 04/15/2019   Hyperlipidemia associated with type 2 diabetes mellitus ([place]) 03/18/2019   Diabetes mellitus ([place]) 03/18/2019   Idiopathic chronic gout of multiple sites without tophus 03/18/2019   Benign essential hypertension 03/18/2019   Dermatitis 03/18/2019   Lumbar degenerative disc disease 11/22/2015   Cervical myelopathy ([place]) 11/22/2015   Osteoarthritis of spine with radiculopathy, lumbar region 11/22/2015     Sherryll Burger, SPT  Student physical therapist under direct supervision of licensed physical therapists during the entirety of the session.   Everlean Alstrom. Graylon Good, PT, DPT 05/09/21, 12:24 PM   [facility] [address], [place], [address] Phone: [phone]   Fax:  [phone]  Name: MORITZ LEVER MRN: [id number] Date of Birth: January 03, 1966

## 2021-05-11 ENCOUNTER — Ambulatory Visit: Payer: Medicaid Other | Admitting: Physical Therapy

## 2021-05-11 ENCOUNTER — Encounter: Payer: Self-pay | Admitting: Physical Therapy

## 2021-05-11 DIAGNOSIS — R29898 Other symptoms and signs involving the musculoskeletal system: Secondary | ICD-10-CM | POA: Diagnosis not present

## 2021-05-11 DIAGNOSIS — M25611 Stiffness of right shoulder, not elsewhere classified: Secondary | ICD-10-CM | POA: Diagnosis not present

## 2021-05-11 DIAGNOSIS — M25612 Stiffness of left shoulder, not elsewhere classified: Secondary | ICD-10-CM

## 2021-05-11 DIAGNOSIS — M542 Cervicalgia: Secondary | ICD-10-CM | POA: Diagnosis not present

## 2021-05-11 NOTE — Therapy (Signed)
[facility] [address] [place] [address], [place], [address] Phone: [phone]   Fax:  [phone]  Physical Therapy Treatment  Patient Details  Name: Cory Rivas MRN: [id number] Date of Birth: [birth year] Referring Provider (PT): Gara Kroner, MD   Encounter Date: 05/11/2021   PT End of Session - 05/11/21 1908     Visit Number 8    Number of Visits 17    Date for PT Re-Evaluation 06/07/21    Authorization Type  MEDICAID HEALTHY BLUE reporting period from 04/12/2021    Authorization Time Period ref [id number] 7/6-8/31 16 PT visits(will not cover [address] mechanical traction )    Authorization - Visit Number 7    Authorization - Number of Visits 16    Progress Note Due on Visit 10    PT Start Time 4332    PT Stop Time 1648    PT Time Calculation (min) 38 min    Activity Tolerance Patient tolerated treatment well    Behavior During Therapy [facility] for tasks assessed/performed             Past Medical History:  Diagnosis Date   Bulging of cervical intervertebral disc    Diabetes mellitus without complication ([place])    Gout    High cholesterol    Hypertension    Stress due to family tension 04/15/2019    Past Surgical History:  Procedure Laterality Date   CHOLECYSTECTOMY     COLONOSCOPY WITH PROPOFOL N/A 03/10/2020   Procedure: COLONOSCOPY WITH PROPOFOL;  Surgeon: Lucilla Lame, MD;  Location: [facility] ENDOSCOPY;  Service: Endoscopy;  Laterality: N/A;    There were no vitals filed for this visit.   Subjective Assessment - 05/11/21 1720     Subjective Patient reports that they are doing pretty good. He states that he was running behind schedule, because he was in the shower and could not turn his head far enough to see what time it was. Patient denies pain at today's session. He states that the increase in stiffness that he experienced last week after helping out a friend has calmed down. Patient reports that HEP is  "pretty good."  He finds the nerve glide exercises helpful.    Patient Stated Goals Increase ROM of cervical spine    Currently in Pain? No/denies    Pain Score 0-No pain    Pain Onset More than a month ago            TREATMENT   Manual Therapy: to increase joint mobility, support increased overall ROM, and provide pain relief to joint musculature. - STM to cervical paraspinals, bil SCM, bil LS, bil UT, suboccipitals. Performed with patient in supine.    Therapeutic Exercise: to address strength, endurance, and ROM deficits for improved pain-free independence with all functional mobility - seated ulnar, radial, and median nerve glides 1x15 each with 2 second hold performed bilaterally. - seated one armed shoulder press 3x10 with 3lb - standing scapular rows 3x12 at cable with 15# (cues to keep wrist straight)    Pt required multimodal cuing for proper technique and to facilitate improved neuromuscular control, strength, range of motion, and functional ability resulting in improved performance and form.        PT Education - 05/11/21 1908     Education Details Exercise purpose/form. Self management techniques    Person(s) Educated Patient    Methods Explanation;Demonstration;Tactile cues;Verbal cues;Handout    Comprehension Verbalized understanding;Returned demonstration;Verbal cues required;Tactile cues  required              PT Short Term Goals - 04/26/21 1749       PT SHORT TERM GOAL #1   Title Pt will be independent with HEP in order to improve strength and decrease back pain in order to improve pain-free function at home and work.    Baseline Given HEP today.    Time 2    Period Weeks    Status Achieved    Target Date 04/26/21               PT Long Term Goals - 04/12/21 1554       PT LONG TERM GOAL #1   Title Pt will demonstrate decrease in NDI by at least 19% in order to demonstrate clinically significant reduction in disability related to neck  injury/pain    Baseline 04/12/21: 70%    Time 8    Period Weeks    Status New    Target Date 06/07/21      PT LONG TERM GOAL #2   Title Pt will decrease worst neck pain as reported on NPRS by at least 2 points in order to demonstrate clinically significant reduction in back pain.    Baseline 04/12/21: Worst Pain: 9/10    Time 8    Period Weeks    Status New    Target Date 06/07/21      PT LONG TERM GOAL #3   Title Pt will increase strength of by at least 1/2 MMT grade in order to demonstrate improvement in strength and function.    Baseline 04/12/21: B UE General MMT: 4+/5    Time 8    Period Weeks    Status New    Target Date 06/07/21      PT LONG TERM GOAL #4   Title Patient will demonstrate improved function as evidenced by a score of 46 on FOTO measure for full participation in activities at home and in the community.    Baseline 04/12/21: FOTO - 34    Time 8    Period Weeks    Status New    Target Date 06/07/21                   Plan - 05/11/21 1911     Clinical Impression Statement Patient tolerated treatment well. Patient session was impacted by patient late arrival. Patient significant ROM limitations continue to impact patient function and community participation, including patient ability to turn his head enough to see the clock and arrive to appointments on time. Patient demonstrated improvements in postural musculature strength as evidenced by row progression. However, patient bilateral UE strength continues to greatly limits patient functional ability. Patient will benefit from skilled physical therapy to address impairments and maximize function.    Personal Factors and Comorbidities Comorbidity 3+    Comorbidities Diabete, Gout, High Cholesterol, HTN    Examination-Activity Limitations Bend;Carry;Dressing;Hygiene/Grooming;Lift    Examination-Participation Restrictions Laundry;Cleaning    Stability/Clinical Decision Making Stable/Uncomplicated    Rehab  Potential Fair    PT Frequency 2x / week    PT Duration 8 weeks    PT Treatment/Interventions ADLs/Self Care Home Management;Cryotherapy;Electrical Stimulation;Moist Heat;Therapeutic activities;Therapeutic exercise;Manual techniques;Dry needling    PT Next Visit Plan Manual therapy to increase tissue restrictions and overall ROM, thoracic mobility, postural and UE strength as tolerated/allowed by precautions    PT Home Exercise Plan Access Code: [email or web address]    Consulted and Agree with Plan of  Care Patient             Patient will benefit from skilled therapeutic intervention in order to improve the following deficits and impairments:  Decreased activity tolerance, Decreased range of motion, Decreased strength, Hypomobility, Pain  Visit Diagnosis: Painful cervical ROM  Shoulder weakness  Decreased range of motion of left shoulder  Decreased range of motion of right shoulder     Problem List Patient Active Problem List   Diagnosis Date Noted   Other chronic pain 37/07/6268   Systolic murmur 48/54/6270   Tobacco use 10/26/2020   Polyp of ascending colon    Rectal polyp    Class 2 severe obesity with serious comorbidity and body mass index (BMI) of 39.0 to 39.9 in adult [facility]) 04/15/2019   Chronic kidney disease (CKD) stage G2/A3, mildly decreased glomerular filtration rate (GFR) between 60-89 mL/min/1.73 Cory meter and albuminuria creatinine ratio greater than 300 mg/g 04/15/2019   Proteinuria 04/15/2019   Hyperlipidemia associated with type 2 diabetes mellitus ([place]) 03/18/2019   Diabetes mellitus ([place]) 03/18/2019   Idiopathic chronic gout of multiple sites without tophus 03/18/2019   Benign essential hypertension 03/18/2019   Dermatitis 03/18/2019   Lumbar degenerative disc disease 11/22/2015   Cervical myelopathy ([place]) 11/22/2015   Osteoarthritis of spine with radiculopathy, lumbar region 11/22/2015   Sherryll Burger, SPT  Student physical therapist under direct  supervision of licensed physical therapists during the entirety of the session.   Everlean Alstrom. Graylon Good, PT, DPT 05/11/21, 7:31 PM   [facility] [address] [place], [address] Phone: [phone]   Fax:  [phone]  Name: Cory Rivas MRN: [id number] Date of Birth: 1965/12/21

## 2021-05-16 ENCOUNTER — Encounter: Payer: Self-pay | Admitting: Physical Therapy

## 2021-05-16 ENCOUNTER — Ambulatory Visit: Payer: Medicaid Other | Attending: Neurological Surgery | Admitting: Physical Therapy

## 2021-05-16 DIAGNOSIS — M25611 Stiffness of right shoulder, not elsewhere classified: Secondary | ICD-10-CM | POA: Diagnosis not present

## 2021-05-16 DIAGNOSIS — M542 Cervicalgia: Secondary | ICD-10-CM | POA: Diagnosis not present

## 2021-05-16 DIAGNOSIS — R29898 Other symptoms and signs involving the musculoskeletal system: Secondary | ICD-10-CM | POA: Diagnosis not present

## 2021-05-16 DIAGNOSIS — M25612 Stiffness of left shoulder, not elsewhere classified: Secondary | ICD-10-CM | POA: Diagnosis not present

## 2021-05-16 NOTE — Therapy (Signed)
[place] [facility] [address], [place], [address] Phone: [phone]   Fax:  [phone]  Physical Therapy Treatment  Patient Details  Name: Cory Rivas MRN: [id number] Date of Birth: [birth year] Referring Provider (PT): Gara Kroner, MD   Encounter Date: 05/16/2021   PT End of Session - 05/16/21 1404     Visit Number 9    Number of Visits 17    Date for PT Re-Evaluation 06/07/21    Authorization Type [place] MEDICAID HEALTHY BLUE reporting period from 04/12/2021    Authorization Time Period ref [id number] 7/6-8/31 16 PT visits(will not cover 97012 mechanical traction )    Authorization - Visit Number 8    Authorization - Number of Visits 16    Progress Note Due on Visit 10    PT Start Time 1307    PT Stop Time 1345    PT Time Calculation (min) 38 min    Activity Tolerance Patient tolerated treatment well    Behavior During Therapy [facility] for tasks assessed/performed             Past Medical History:  Diagnosis Date   Bulging of cervical intervertebral disc    Diabetes mellitus without complication ([place])    Gout    High cholesterol    Hypertension    Stress due to family tension 04/15/2019    Past Surgical History:  Procedure Laterality Date   CHOLECYSTECTOMY     COLONOSCOPY WITH PROPOFOL N/A 03/10/2020   Procedure: COLONOSCOPY WITH PROPOFOL;  Surgeon: Lucilla Lame, MD;  Location: [facility] ENDOSCOPY;  Service: Endoscopy;  Laterality: N/A;    There were no vitals filed for this visit.   Subjective Assessment - 05/16/21 1403     Subjective Patient denies pain. However, patient reports tingling in 2nd and 3rd digit on R hand. Patient states that 3rd digit symptoms are constant. 2nd digit symptoms changed based on position and activity level. Patient is unable to determine a pattern. Patient reports 2nd digit symptoms are alleviated by nerve glides.    Patient Stated Goals Increase ROM of cervical spine     Currently in Pain? No/denies    Pain Score 0-No pain    Pain Radiating Towards numbness/tingling radiating towards 2nd and 3rd digit on R hand.    Pain Onset More than a month ago             TREATMENT   Therapeutic Exercise: to address strength, endurance, and ROM deficits for improved pain-free independence with all functional mobility - supine cervical rotation to L and R with contract/relax technique to improve ROM. 3x each direction with 6 second resistive contraction - supine cervical flexion/extension with contract/relax technique to improve ROM. 2x each direction with 6 second resistive contraction.  - supine cervical rotation to L and R AROM. 10x each direction.  - Standing cervical thoracic extension/BUE flexion and serratus anterior activation, lat stretch, with foam roller up wall, 5 second holds. 1x10 (feels good at upper back and lower back). - seated bicep curl 1x12 with 5lb weight performed bilaterally. 1x12 with 7lbs with R hand. 2x6 with L hand and 7lbs.  - seated one armed shoulder press 3x10 with 7lb weight in R hand, 2x9 with 5lb weight in L hand.   Pt required multimodal cuing for proper technique and to facilitate improved neuromuscular control, strength, range of motion, and functional ability resulting in improved performance and form.      PT Education -  05/16/21 1404     Education Details Exercise purpose/form. Self management techniques    Person(s) Educated Patient    Methods Explanation;Tactile cues;Demonstration;Verbal cues    Comprehension Verbalized understanding;Returned demonstration;Tactile cues required;Verbal cues required              PT Short Term Goals - 04/26/21 1749       PT SHORT TERM GOAL #1   Title Pt will be independent with HEP in order to improve strength and decrease back pain in order to improve pain-free function at home and work.    Baseline Given HEP today.    Time 2    Period Weeks    Status Achieved    Target  Date 04/26/21               PT Long Term Goals - 04/12/21 1554       PT LONG TERM GOAL #1   Title Pt will demonstrate decrease in NDI by at least 19% in order to demonstrate clinically significant reduction in disability related to neck injury/pain    Baseline 04/12/21: 70%    Time 8    Period Weeks    Status New    Target Date 06/07/21      PT LONG TERM GOAL #2   Title Pt will decrease worst neck pain as reported on NPRS by at least 2 points in order to demonstrate clinically significant reduction in back pain.    Baseline 04/12/21: Worst Pain: 9/10    Time 8    Period Weeks    Status New    Target Date 06/07/21      PT LONG TERM GOAL #3   Title Pt will increase strength of by at least 1/2 MMT grade in order to demonstrate improvement in strength and function.    Baseline 04/12/21: B UE General MMT: 4+/5    Time 8    Period Weeks    Status New    Target Date 06/07/21      PT LONG TERM GOAL #4   Title Patient will demonstrate improved function as evidenced by a score of 46 on FOTO measure for full participation in activities at home and in the community.    Baseline 04/12/21: FOTO - 34    Time 8    Period Weeks    Status New    Target Date 06/07/21            OBJECTIVE:  FUNCTIONAL OUTCOMES: FOTO: 37  TREATMENT   Therapeutic Exercise: to address strength, endurance, and ROM deficits for improved pain-free independence with all functional mobility - supine cervical rotation to L and R with contract/relax technique to improve ROM. 3x each direction with 6 second resistive contraction - supine cervical flexion/extension with contract/relax technique to improve ROM. 2x each direction with 6 second resistive contraction.  - supine cervical rotation to L and R AROM. 10x each direction.  - Standing cervical thoracic extension/BUE flexion and serratus anterior activation, lat stretch, with foam roller up wall, 5 second holds. 1x10 (feels good at upper back and lower  back). - seated bicep curl 1x12 with 5lb weight performed bilaterally. 1x12 with 7lbs with R hand. 2x6 with L hand and 7lbs.  - seated one armed shoulder press 3x10 with 7lb weight in R hand, 2x9 with 5lb weight in L hand.  Pt required multimodal cuing for proper technique and to facilitate improved neuromuscular control, strength, range of motion, and functional ability resulting in improved performance and  form.    Plan - 05/16/21 1405     Clinical Impression Statement Patient tolerated treatment well. Patient cervical ROM continues to limit patient functional ability as evidenced by FOTO score (37). Session focused on UE strengthening and thoracic/cervical mobility to maximize patient functinal ability post cervical fusion. Patient L UE remains weaker than R UE and fatigues quickly. However, patient demonstrated improvements in R UE strength with progression of weights and reps. Patient will benefit from skilled physical therpay to address impairments and maximize function.    Personal Factors and Comorbidities Comorbidity 3+    Comorbidities Diabete, Gout, High Cholesterol, HTN    Examination-Activity Limitations Bend;Carry;Dressing;Hygiene/Grooming;Lift    Examination-Participation Restrictions Laundry;Cleaning    Stability/Clinical Decision Making Stable/Uncomplicated    Rehab Potential Fair    PT Frequency 2x / week    PT Duration 8 weeks    PT Treatment/Interventions ADLs/Self Care Home Management;Cryotherapy;Electrical Stimulation;Moist Heat;Therapeutic activities;Therapeutic exercise;Manual techniques;Dry needling    PT Next Visit Plan Manual therapy to increase tissue restrictions and overall ROM, thoracic mobility, postural and UE strength as tolerated/allowed by precautions    PT Home Exercise Plan Access Code: [place] and Agree with Plan of Care Patient             Patient will benefit from skilled therapeutic intervention in order to improve the following  deficits and impairments:  Decreased activity tolerance, Decreased range of motion, Decreased strength, Hypomobility, Pain  Visit Diagnosis: Painful cervical ROM  Decreased range of motion of right shoulder  Shoulder weakness  Decreased range of motion of left shoulder     Problem List Patient Active Problem List   Diagnosis Date Noted   Other chronic pain 44/81/8563   Systolic murmur 14/97/0263   Tobacco use 10/26/2020   Polyp of ascending colon    Rectal polyp    Class 2 severe obesity with serious comorbidity and body mass index (BMI) of 39.0 to 39.9 in adult ([place]) 04/15/2019   Chronic kidney disease (CKD) stage G2/A3, mildly decreased glomerular filtration rate (GFR) between 60-89 mL/min/1.73 square meter and albuminuria creatinine ratio greater than 300 mg/g 04/15/2019   Proteinuria 04/15/2019   Hyperlipidemia associated with type 2 diabetes mellitus ([place]) 03/18/2019   Diabetes mellitus ([place]) 03/18/2019   Idiopathic chronic gout of multiple sites without tophus 03/18/2019   Benign essential hypertension 03/18/2019   Dermatitis 03/18/2019   Lumbar degenerative disc disease 11/22/2015   Cervical myelopathy ([place]) 11/22/2015   Osteoarthritis of spine with radiculopathy, lumbar region 11/22/2015   Sherryll Burger, SPT  Student physical therapist under direct supervision of licensed physical therapists during the entirety of the session.   Everlean Alstrom. Graylon Good, PT, DPT 05/16/21, 2:32 PM   [place] [facility] [address], [place], [address] Phone: [phone]   Fax:  [phone]  Name: Cory Rivas MRN: [id number] Date of Birth: 1966-03-24

## 2021-05-18 ENCOUNTER — Ambulatory Visit: Payer: Medicaid Other | Admitting: Physical Therapy

## 2021-05-18 ENCOUNTER — Encounter: Payer: Self-pay | Admitting: Physical Therapy

## 2021-05-18 DIAGNOSIS — M542 Cervicalgia: Secondary | ICD-10-CM

## 2021-05-18 DIAGNOSIS — R29898 Other symptoms and signs involving the musculoskeletal system: Secondary | ICD-10-CM

## 2021-05-18 DIAGNOSIS — M25611 Stiffness of right shoulder, not elsewhere classified: Secondary | ICD-10-CM

## 2021-05-18 DIAGNOSIS — M25612 Stiffness of left shoulder, not elsewhere classified: Secondary | ICD-10-CM

## 2021-05-18 NOTE — Therapy (Addendum)
[place] [facility] [address], [place], [address] Phone: [phone]   Fax:  [phone]  Physical Therapy Treatment/Progress Note/Re-Certification Dates of Reporting: 04/12/2021 - 05/18/2021   Patient Details  Name: Cory Rivas MRN: [id number] Date of Birth: 01-Oct-1966 Referring Provider (PT): Gara Kroner, MD   Encounter Date: 05/18/2021   PT End of Session - 05/18/21 1406     Visit Number 10    Number of Visits 17    Date for PT Re-Evaluation 06/07/21    Authorization Type [place] MEDICAID HEALTHY BLUE reporting period from 05/18/2021    Authorization Time Period ref [id number] 7/6-8/31 16 PT visits(will not cover 97012 mechanical traction )    Authorization - Visit Number 9    Authorization - Number of Visits 16    Progress Note Due on Visit 20    PT Start Time 1300    PT Stop Time 1338    PT Time Calculation (min) 38 min    Activity Tolerance Patient tolerated treatment well    Behavior During Therapy [facility] for tasks assessed/performed             Past Medical History:  Diagnosis Date   Bulging of cervical intervertebral disc    Diabetes mellitus without complication ([place])    Gout    High cholesterol    Hypertension    Stress due to family tension 04/15/2019    Past Surgical History:  Procedure Laterality Date   CHOLECYSTECTOMY     COLONOSCOPY WITH PROPOFOL N/A 03/10/2020   Procedure: COLONOSCOPY WITH PROPOFOL;  Surgeon: Lucilla Lame, MD;  Location: [facility] ENDOSCOPY;  Service: Endoscopy;  Laterality: N/A;    There were no vitals filed for this visit.    Subjective Assessment - 05/18/21 1405     Subjective Patient reports that physical therapy is helping a lot. He has noticed great improvements in pain, cervical ROM, and strength. Patient recalls being so tight, in pain, and afraid to move at initial evaluation. Patient demonstrates greater ability to move bilateral arms, but notes that L arm is  still much weaker than his right arm. Patient reports that shaving is still a challenge. Patient denies pain with usual activities. He continues to report tightness and numbness in the back of his neck.    Patient Stated Goals Increase ROM of cervical spine    Currently in Pain? No/denies    Pain Score 0-No pain    Pain Onset More than a month ago             OBJECTIVE:   Strength R/L Shoulder flexion (anterior deltoid/pec major/coracobrachialis, axillary n. (C5/6) and musculocutaneous n. (C5-7)) - 4+/4 Shoulder abduction (deltoid/supraspinatus, axillary/suprascapular n, C5) - 4+/4+  Elbow flexion (biceps brachii, brachialis, brachioradialis, musculoskeletal n, C5/6)  - 4+/4  Elbow extension (triceps, radial n, C7) - 5/5 Wrist Extension (C6/7) - 5/4+ Wrist Flexion (C6/7) - 5/5      AROM R/L   150/146 Shoulder Flexion   150 R - 146 L - arm is sore 152/145* Shoulder Abduction  *Full range L shoulder abduction produced 7/10 pain that lasted approximately 3 minutes. Pain spread from L lateral neck into L arm. Pain stopped before elbow.    16 - Cervical Flexion 9 - Cervical Extension 6/22 - Cervical Lateral Flexion 20/40 - Cervical Rotation   Treatment  Therapeutic exercise: to centralize symptoms and improve ROM, strength, muscular endurance, and activity tolerance required for successful completion of functional  activities.   - Objective measurements completed on examination: See above findings.   - seated one armed shoulder press 3x10 with 7lb weight in R hand, 3x10 with 5lb weight in L hand. - seated bicep curl 3x12 with 7lbs with R hand. 3x6 with L hand and 7lbs. - standing scapular rows 3x10 at cable with 25# (cues to keep wrist straight)  Pt required multimodal cuing for proper technique and to facilitate improved neuromuscular control, strength, range of motion, and functional ability resulting in improved performance and form.     PT Education - 05/18/21 1406      Education Details Exercise purpose/form. Progress made since inital evaluation    Person(s) Educated Patient    Methods Explanation;Demonstration;Tactile cues;Verbal cues    Comprehension Verbalized understanding;Verbal cues required;Tactile cues required;Returned demonstration              PT Short Term Goals - 04/26/21 1749       PT SHORT TERM GOAL #1   Title Pt will be independent with HEP in order to improve strength and decrease back pain in order to improve pain-free function at home and work.    Baseline Given HEP today.    Time 2    Period Weeks    Status Achieved    Target Date 04/26/21               PT Long Term Goals - 05/18/21 1413  UPDATED TARGET DATE FOR ALL LONG TERM GOALS: 08/10/2021      PT LONG TERM GOAL #1   Title Pt will demonstrate decrease in NDI by at least 19% in order to demonstrate clinically significant reduction in disability related to neck injury/pain    Baseline 70% (04/12/21), 46% (05/18/2021)    Time 12   Period Weeks ;    Status Achieved   Patient unable to read questions, SPT read questions/options and marked pt answers. Pain is no longer patient greatest limiting factor so SPT questions validity of results.     PT LONG TERM GOAL #2   Title Pt will decrease worst neck pain as reported on NPRS by at least 2 points in order to demonstrate clinically significant reduction in back pain.    Baseline W: 9/10 (04/12/09): W: 7/10 provoked with maximum L shoulder abduction. Patient pain with usual activities 0/10 (05/18/2021)    Time 12   Period Weeks    Status Achieved      PT LONG TERM GOAL #3   Title Pt will increase strength of by at least 1/2 MMT grade in order to demonstrate improvement in strength and function.    Baseline 04/12/21: B UE General MMT: 4+/5, 05/18/2021: B UE General MMT (4 - 4+)/5    Time 12   Period Weeks    Status On-going      PT LONG TERM GOAL #4   Title Patient will demonstrate improved function as evidenced by a score  of 46 on FOTO measure for full participation in activities at home and in the community.    Baseline 34 (04/12/21), 37 (05/18/2021)    Time 12   Period Weeks    Status On-going                   Plan - 05/18/21 1434     Clinical Impression Statement Patient has attended 10 skilled physical therapy treatment sessions this episode of care and overall continues to make progress towards stated goals. Patient is a 55 y.o. male  referred to outpatient physical therapy for care s/p cervical vertebral fusion (C2-T2) on 02/01/2021. Past treatment sessions have focused on addressing patient limited cervical mobility, neck pain, and impaired UE strength to maximize functional ability. Patient demonstrates significant improvements in all cervical ROM since initial evaluation. However, patient cervical ROM remains limited and impacts patient ability to drive, perform ADLs, turn head, and participate in usual community/household activities. Patient demonstrated minor, but not significant improvements in bilateral UE strength during today's assessment. However, patient did present with improvements in pain during MMT assessment, suggesting in improvements in UE function. Patient has not made significant improvements in FOTO score since initial evaluation (34: 04/12/2021 - 37: 05/18/2021) indicating patient impairments still greatly impact patient overall condition and functional ability. Patient's condition continues to have good potential to improve in response to therapy. Maximum improvement is yet to be obtained. Patient will continue to benefit from skilled physical therapy management to address remaining impairments, work towards stated goals, and maximize functional independence.    Personal Factors and Comorbidities Comorbidity 3+    Comorbidities Diabete, Gout, High Cholesterol, HTN    Examination-Activity Limitations Bend;Carry;Dressing;Hygiene/Grooming;Lift    Examination-Participation Restrictions  Laundry;Cleaning    Stability/Clinical Decision Making Stable/Uncomplicated    Rehab Potential Fair    PT Frequency 2x / week    PT Duration 12 weeks    PT Treatment/Interventions ADLs/Self Care Home Management;Cryotherapy;Electrical Stimulation;Moist Heat;Therapeutic activities;Therapeutic exercise;Manual techniques;Dry needling    PT Next Visit Plan Manual therapy to increase tissue restrictions and overall ROM, thoracic mobility, postural and UE strength as tolerated/allowed by precautions    PT Home Exercise Plan Access Code: [place] and Agree with Plan of Care Patient             Patient will benefit from skilled therapeutic intervention in order to improve the following deficits and impairments:  Decreased activity tolerance, Decreased range of motion, Decreased strength, Hypomobility, Pain  Visit Diagnosis: Painful cervical ROM  Decreased range of motion of right shoulder  Shoulder weakness  Decreased range of motion of left shoulder     Problem List Patient Active Problem List   Diagnosis Date Noted   Other chronic pain 19/62/2297   Systolic murmur 98/92/1194   Tobacco use 10/26/2020   Polyp of ascending colon    Rectal polyp    Class 2 severe obesity with serious comorbidity and body mass index (BMI) of 39.0 to 39.9 in adult ([place]) 04/15/2019   Chronic kidney disease (CKD) stage G2/A3, mildly decreased glomerular filtration rate (GFR) between 60-89 mL/min/1.73 square meter and albuminuria creatinine ratio greater than 300 mg/g 04/15/2019   Proteinuria 04/15/2019   Hyperlipidemia associated with type 2 diabetes mellitus ([place]) 03/18/2019   Diabetes mellitus ([place]) 03/18/2019   Idiopathic chronic gout of multiple sites without tophus 03/18/2019   Benign essential hypertension 03/18/2019   Dermatitis 03/18/2019   Lumbar degenerative disc disease 11/22/2015   Cervical myelopathy ([place]) 11/22/2015   Osteoarthritis of spine with radiculopathy, lumbar  region 11/22/2015    Sherryll Burger, SPT  Student physical therapist under direct supervision of licensed physical therapists during the entirety of the session.   Everlean Alstrom. Graylon Good, PT, DPT 05/18/21, 4:38 PM  Addendum 06/06/2021 to correct error in re-certification/plan/goals.  Everlean Alstrom. Graylon Good, PT, DPT 06/06/21, 2:57 PM    [place] [facility] [address] [place], [address] Phone: [phone]   Fax:  [phone]  Name: Cory Rivas MRN: [id number] Date  of Birth: [birth year]

## 2021-05-22 ENCOUNTER — Encounter: Payer: Self-pay | Admitting: Physical Therapy

## 2021-05-22 ENCOUNTER — Ambulatory Visit: Payer: Medicaid Other | Admitting: Physical Therapy

## 2021-05-22 DIAGNOSIS — M25611 Stiffness of right shoulder, not elsewhere classified: Secondary | ICD-10-CM

## 2021-05-22 DIAGNOSIS — M542 Cervicalgia: Secondary | ICD-10-CM

## 2021-05-22 DIAGNOSIS — R29898 Other symptoms and signs involving the musculoskeletal system: Secondary | ICD-10-CM | POA: Diagnosis not present

## 2021-05-22 DIAGNOSIS — M25612 Stiffness of left shoulder, not elsewhere classified: Secondary | ICD-10-CM | POA: Diagnosis not present

## 2021-05-22 NOTE — Therapy (Signed)
[place] [facility] [address], [place], [address] Phone: [phone]   Fax:  [phone]  Physical Therapy Treatment  Patient Details  Name: Cory Rivas MRN: [id number] Date of Birth: February 06, 1966 Referring Provider (PT): Gara Kroner, MD   Encounter Date: 05/22/2021   PT End of Session - 05/22/21 1510     Visit Number 11    Number of Visits 17    Date for PT Re-Evaluation 06/07/21    Authorization Type [place] MEDICAID HEALTHY BLUE reporting period from 05/18/2021    Authorization Time Period ref [id number] 7/6-8/31 16 PT visits(will not cover 97012 mechanical traction )    Authorization - Visit Number 10    Authorization - Number of Visits 16    Progress Note Due on Visit 20    PT Start Time 1303    PT Stop Time 1341    PT Time Calculation (min) 38 min    Activity Tolerance Patient tolerated treatment well;Patient limited by pain    Behavior During Therapy [facility] for tasks assessed/performed             Past Medical History:  Diagnosis Date   Bulging of cervical intervertebral disc    Diabetes mellitus without complication ([place])    Gout    High cholesterol    Hypertension    Stress due to family tension 04/15/2019    Past Surgical History:  Procedure Laterality Date   CHOLECYSTECTOMY     COLONOSCOPY WITH PROPOFOL N/A 03/10/2020   Procedure: COLONOSCOPY WITH PROPOFOL;  Surgeon: Lucilla Lame, MD;  Location: [facility] ENDOSCOPY;  Service: Endoscopy;  Laterality: N/A;    There were no vitals filed for this visit.   Subjective Assessment - 05/22/21 1448     Subjective Patient reports slamming on his breaks while driving 06[place] on Saturday to avoid hitting a dog. Patient also reports increased work load on saturday while helping out a friend (driving, getting friend groceries). patient experienced 9/10 neck/shoulder pain Saturday after these two events occured. Patient arrived to PT today with only slight  decrease in pain. Patient reports pain, sorenss, and tightness in back of his head as well as bilateral shoulders, jaw, upper back and spine. Patient describes pins and needles feeling in posterior head. Patient descripes sharp shooting pain that radiates from neck into his arms. Patient expresses concern about symptoms and wonders if he should call his physician.    Patient Stated Goals Increase ROM of cervical spine    Pain Score 9     Pain Location Neck    Pain Descriptors / Indicators Aching;Sharp;Shooting    Pain Radiating Towards symptoms radiating down both arms.    Pain Onset More than a month ago    Pain Frequency Constant    Aggravating Factors  sleeping              OBJECTIVE:  Pathological Reflexes:  Hoffman's: Negative bilaterally  UE MMT:  - WFL. No apparent changes since MMT assessed at last appointment.   Dermatomes:  C2: Patient reports pins/needles feeling upon touch C3-T2: Patient reports demished sensation on L UE compared to R.    TREATMENT:  Manual Therapy: to increase joint mobility, support increased overall ROM, and provide pain relief to joint musculature.  - STM to cervical paraspinals, bil SCM, bil LS, bil UT, suboccipitals. Performed with patient in supine. Passive ROM rotation 1x4 each direction - supine cervical rotation with contract relax technique, 1x4 each direction  with 6 second hold.  Therapeutic exercise: to centralize symptoms and improve ROM, strength, muscular endurance, and activity tolerance required for successful completion of functional activities.   - Objective measurements completed on examination: Performed to maximize patient safety due to new subjective symptoms. See above findings.   - supine cervical deep flexion 1x2. Discontinued due to patient report of increased eye pressure.  -seated cervical flexion/extension 1x15 each direction  -seated cervical rotation 1x7 each direction. Limited due patient request and report of  increased pain.  - serratus activation at wall with foam roll 1x10  Pt required multimodal cuing for proper technique and to facilitate improved neuromuscular control, strength, range of motion, and functional ability resulting in improved performance and form.      PT Short Term Goals - 04/26/21 1749       PT SHORT TERM GOAL #1   Title Pt will be independent with HEP in order to improve strength and decrease back pain in order to improve pain-free function at home and work.    Baseline Given HEP today.    Time 2    Period Weeks    Status Achieved    Target Date 04/26/21               PT Long Term Goals - 05/18/21 1413       PT LONG TERM GOAL #1   Title Pt will demonstrate decrease in NDI by at least 19% in order to demonstrate clinically significant reduction in disability related to neck injury/pain    Baseline 70% (04/12/21), 46% (05/18/2021)    Time 8    Period Weeks    Status Achieved   Patient unable to read questions, SPT read questions/options and marked pt answers. Pain is no longer patient greatest limiting factor so SPT questions validity of results.     PT LONG TERM GOAL #2   Title Pt will decrease worst neck pain as reported on NPRS by at least 2 points in order to demonstrate clinically significant reduction in back pain.    Baseline W: 9/10 (04/12/09): W: 7/10 provoked with maximum L shoulder abduction. Patient pain with usual activities 0/10 (05/18/2021)    Time 8    Period Weeks    Status Achieved      PT LONG TERM GOAL #3   Title Pt will increase strength of by at least 1/2 MMT grade in order to demonstrate improvement in strength and function.    Baseline 04/12/21: B UE General MMT: 4+/5, 05/18/2021: B UE General MMT (4 - 4+)/5    Time 8    Period Weeks    Status On-going      PT LONG TERM GOAL #4   Title Patient will demonstrate improved function as evidenced by a score of 46 on FOTO measure for full participation in activities at home and in the  community.    Baseline 34 (04/12/21), 37 (05/18/2021)    Time 8    Period Weeks    Status On-going                   Plan - 05/22/21 1717     Clinical Impression Statement Patient tolerated treatment fair. However, session was impacted by patient pain and complaints of increased eye pressure. Objective measures such as MMT, sensation, and reflexes were assessed to maximize patient safety and rule out red flags after the patients recent incident. Patient reponded well to manual therapy today per patient report of decreased stiffness. However,  session emphasized pain management and improve cervical/thoracic tension in order to manage patient symptoms which prevented progression of POC. Patient symptoms easily become aggrivated post increase in activty level at home further demonstrating decreased activity tolerance and limited community participation as well. Patient will continue to benefit from skilled physical therapy to address impairments, work towards stated goals, and maximize functional independence.    Personal Factors and Comorbidities Comorbidity 3+    Comorbidities Diabete, Gout, High Cholesterol, HTN    Examination-Activity Limitations Bend;Carry;Dressing;Hygiene/Grooming;Lift    Examination-Participation Restrictions Laundry;Cleaning    Stability/Clinical Decision Making Stable/Uncomplicated    Rehab Potential Fair    PT Frequency 2x / week    PT Duration 8 weeks    PT Treatment/Interventions ADLs/Self Care Home Management;Cryotherapy;Electrical Stimulation;Moist Heat;Therapeutic activities;Therapeutic exercise;Manual techniques;Dry needling    PT Next Visit Plan Manual therapy to increase tissue restrictions and overall ROM, thoracic mobility, postural and UE strength as tolerated/allowed by precautions    PT Home Exercise Plan Access Code: [place] and Agree with Plan of Care Patient             Patient will benefit from skilled therapeutic intervention  in order to improve the following deficits and impairments:  Decreased activity tolerance, Decreased range of motion, Decreased strength, Hypomobility, Pain  Visit Diagnosis: Painful cervical ROM  Decreased range of motion of right shoulder  Shoulder weakness  Decreased range of motion of left shoulder     Problem List Patient Active Problem List   Diagnosis Date Noted   Other chronic pain 04/09/9484   Systolic murmur 46/27/0350   Tobacco use 10/26/2020   Polyp of ascending colon    Rectal polyp    Class 2 severe obesity with serious comorbidity and body mass index (BMI) of 39.0 to 39.9 in adult ([place]) 04/15/2019   Chronic kidney disease (CKD) stage G2/A3, mildly decreased glomerular filtration rate (GFR) between 60-89 mL/min/1.73 square meter and albuminuria creatinine ratio greater than 300 mg/g 04/15/2019   Proteinuria 04/15/2019   Hyperlipidemia associated with type 2 diabetes mellitus ([place]) 03/18/2019   Diabetes mellitus ([place]) 03/18/2019   Idiopathic chronic gout of multiple sites without tophus 03/18/2019   Benign essential hypertension 03/18/2019   Dermatitis 03/18/2019   Lumbar degenerative disc disease 11/22/2015   Cervical myelopathy ([place]) 11/22/2015   Osteoarthritis of spine with radiculopathy, lumbar region 11/22/2015   Sherryll Burger, SPT  Student physical therapist under direct supervision of licensed physical therapists during the entirety of the session.   Everlean Alstrom. Graylon Good, PT, DPT 05/23/21, 10:16 AM   [place] [facility] [address] [place], [address] Phone: [phone]   Fax:  [phone]  Name: Cory Rivas MRN: [id number] Date of Birth: 1966-02-14

## 2021-05-24 ENCOUNTER — Ambulatory Visit: Payer: Medicaid Other | Admitting: Physical Therapy

## 2021-05-24 DIAGNOSIS — M25612 Stiffness of left shoulder, not elsewhere classified: Secondary | ICD-10-CM | POA: Diagnosis not present

## 2021-05-24 DIAGNOSIS — M25611 Stiffness of right shoulder, not elsewhere classified: Secondary | ICD-10-CM | POA: Diagnosis not present

## 2021-05-24 DIAGNOSIS — R29898 Other symptoms and signs involving the musculoskeletal system: Secondary | ICD-10-CM

## 2021-05-24 DIAGNOSIS — M542 Cervicalgia: Secondary | ICD-10-CM

## 2021-05-24 NOTE — Therapy (Signed)
[place] [facility] [address], [place], [address] Phone: [phone]   Fax:  [phone]  Physical Therapy Treatment  Patient Details  Name: Cory Rivas MRN: [id number] Date of Birth: Dec 17, 1965 Referring Provider (PT): Gara Kroner, MD   Encounter Date: 05/24/2021   PT End of Session - 05/24/21 1500     Visit Number 12    Number of Visits 17    Date for PT Re-Evaluation 06/07/21    Authorization Type [place] MEDICAID HEALTHY BLUE reporting period from 05/18/2021    Authorization Time Period ref [id number] 7/6-8/31 16 PT visits(will not cover 97012 mechanical traction )    Authorization - Visit Number 11    Authorization - Number of Visits 16    Progress Note Due on Visit 20    PT Start Time 9242    PT Stop Time 1430    PT Time Calculation (min) 41 min    Activity Tolerance Patient tolerated treatment well;Patient limited by pain    Behavior During Therapy [facility] for tasks assessed/performed             Past Medical History:  Diagnosis Date   Bulging of cervical intervertebral disc    Diabetes mellitus without complication ([place])    Gout    High cholesterol    Hypertension    Stress due to family tension 04/15/2019    Past Surgical History:  Procedure Laterality Date   CHOLECYSTECTOMY     COLONOSCOPY WITH PROPOFOL N/A 03/10/2020   Procedure: COLONOSCOPY WITH PROPOFOL;  Surgeon: Lucilla Lame, MD;  Location: [facility] ENDOSCOPY;  Service: Endoscopy;  Laterality: N/A;    There were no vitals filed for this visit.   Subjective Assessment - 05/24/21 1459     Subjective Patient reports that he is doing a whole lot better since his last appointment. He states that he did not contact neuro surgeon, because everything got better. He has no pain today. He denies pressure behind eyes. Patient states numbness in R digits has been better the last two days. Patient continues to reports tightness and pins and needles  sensation near incision site.    Patient Stated Goals Increase ROM of cervical spine    Currently in Pain? No/denies    Pain Score 0-No pain    Pain Onset More than a month ago              TREATMENT:  Manual Therapy: to increase joint mobility, support increased overall ROM, and provide pain relief to joint musculature.  - STM to cervical paraspinals, bil SCM, bil LS, bil UT, suboccipitals. Performed with patient in supine. - passive cervical rotation with 5 second stretch 1x6. Performed each direction.  - supine occipitoaxial retraction mobilization targeting R and L occipital region 1x10 each direction  - supine OA cervical rotation with contract relax technique, 1x4 each direction with 6 second hold.  Therapeutic exercise: to centralize symptoms and improve ROM, strength, muscular endurance, and activity tolerance required for successful completion of functional activities.   - seated cervical SNAG rotation, 10x5 seconds bilaterally - standing cervical SNAG rotation 10x5 seconds bilaterally - seated cervical side bending with overpressure 1x8 each side. Hold for 5 seconds.  - standing scapular rows 3x10 at cable with 25# (cues to keep wrist straight)  Pt required multimodal cuing for proper technique and to facilitate improved neuromuscular control, strength, range of motion, and functional ability resulting in improved performance and form.  PT Education - 05/24/21 1500     Education Details exercise purpose form.    Person(s) Educated Patient    Methods Explanation;Demonstration;Tactile cues;Verbal cues;Handout    Comprehension Verbalized understanding;Returned demonstration;Verbal cues required;Tactile cues required              PT Short Term Goals - 04/26/21 1749       PT SHORT TERM GOAL #1   Title Pt will be independent with HEP in order to improve strength and decrease back pain in order to improve pain-free function at home and work.    Baseline Given  HEP today.    Time 2    Period Weeks    Status Achieved    Target Date 04/26/21               PT Long Term Goals - 05/18/21 1413       PT LONG TERM GOAL #1   Title Pt will demonstrate decrease in NDI by at least 19% in order to demonstrate clinically significant reduction in disability related to neck injury/pain    Baseline 70% (04/12/21), 46% (05/18/2021)    Time 8    Period Weeks    Status Achieved   Patient unable to read questions, SPT read questions/options and marked pt answers. Pain is no longer patient greatest limiting factor so SPT questions validity of results.     PT LONG TERM GOAL #2   Title Pt will decrease worst neck pain as reported on NPRS by at least 2 points in order to demonstrate clinically significant reduction in back pain.    Baseline W: 9/10 (04/12/09): W: 7/10 provoked with maximum L shoulder abduction. Patient pain with usual activities 0/10 (05/18/2021)    Time 8    Period Weeks    Status Achieved      PT LONG TERM GOAL #3   Title Pt will increase strength of by at least 1/2 MMT grade in order to demonstrate improvement in strength and function.    Baseline 04/12/21: B UE General MMT: 4+/5, 05/18/2021: B UE General MMT (4 - 4+)/5    Time 8    Period Weeks    Status On-going      PT LONG TERM GOAL #4   Title Patient will demonstrate improved function as evidenced by a score of 46 on FOTO measure for full participation in activities at home and in the community.    Baseline 34 (04/12/21), 37 (05/18/2021)    Time 8    Period Weeks    Status On-going               Plan - 05/25/21 1538     Clinical Impression Statement Patient tolerated treatment well. Session focused on addressing cervical ROM, strength, and activity tolerance. Patient continues to be limited in cervical rotation, flexion, extension, and sidebending which limit patient community participation and ability to peform adls. However, patient reports improvements in neck ROM/stiffness post  therapeutic activity. Plan to futher incorporate functional lifting/activity into subsequent sessions to improve patient activity tolerance and strength. Patient will continue to benefit from skilled physical therapy to address impairments, work towards stated goals, and maximize functional indepence.    Personal Factors and Comorbidities Comorbidity 3+    Comorbidities Diabete, Gout, High Cholesterol, HTN    Examination-Activity Limitations Bend;Carry;Dressing;Hygiene/Grooming;Lift    Examination-Participation Restrictions Laundry;Cleaning    Stability/Clinical Decision Making Stable/Uncomplicated    Rehab Potential Fair    PT Frequency 2x / week    PT  Duration 8 weeks    PT Treatment/Interventions ADLs/Self Care Home Management;Cryotherapy;Electrical Stimulation;Moist Heat;Therapeutic activities;Therapeutic exercise;Manual techniques;Dry needling    PT Next Visit Plan Manual therapy to increase tissue restrictions and overall ROM, thoracic mobility, postural and UE strength as tolerated/allowed by precautions    PT Home Exercise Plan Access Code: [email or web address]    Consulted and Agree with Plan of Care Patient             Patient will benefit from skilled therapeutic intervention in order to improve the following deficits and impairments:  Decreased activity tolerance, Decreased range of motion, Decreased strength, Hypomobility, Pain  Visit Diagnosis: Decreased range of motion of right shoulder  Painful cervical ROM  Decreased range of motion of left shoulder  Shoulder weakness     Problem List Patient Active Problem List   Diagnosis Date Noted   Other chronic pain 99/24/2683   Systolic murmur 41/96/2229   Tobacco use 10/26/2020   Polyp of ascending colon    Rectal polyp    Class 2 severe obesity with serious comorbidity and body mass index (BMI) of 39.0 to 39.9 in adult ([place]) 04/15/2019   Chronic kidney disease (CKD) stage G2/A3, mildly decreased glomerular filtration rate  (GFR) between 60-89 mL/min/1.73 square meter and albuminuria creatinine ratio greater than 300 mg/g 04/15/2019   Proteinuria 04/15/2019   Hyperlipidemia associated with type 2 diabetes mellitus ([place]) 03/18/2019   Diabetes mellitus ([place]) 03/18/2019   Idiopathic chronic gout of multiple sites without tophus 03/18/2019   Benign essential hypertension 03/18/2019   Dermatitis 03/18/2019   Lumbar degenerative disc disease 11/22/2015   Cervical myelopathy ([place]) 11/22/2015   Osteoarthritis of spine with radiculopathy, lumbar region 11/22/2015    Sherryll Burger, SPT  Student physical therapist under direct supervision of licensed physical therapists during the entirety of the session.   Everlean Alstrom. Graylon Good, PT, DPT 05/25/21, 5:17 PM   [place] [facility] [address], [place], [address] Phone: [phone]   Fax:  [phone]  Name: JAXTEN BROSH MRN: [id number] Date of Birth: [birth year]

## 2021-05-25 ENCOUNTER — Encounter: Payer: Self-pay | Admitting: Podiatry

## 2021-05-25 ENCOUNTER — Other Ambulatory Visit: Payer: Self-pay

## 2021-05-25 ENCOUNTER — Encounter: Payer: Self-pay | Admitting: Physical Therapy

## 2021-05-25 ENCOUNTER — Ambulatory Visit: Payer: Medicaid Other | Admitting: Podiatry

## 2021-05-25 DIAGNOSIS — M79675 Pain in left toe(s): Secondary | ICD-10-CM | POA: Diagnosis not present

## 2021-05-25 DIAGNOSIS — E0843 Diabetes mellitus due to underlying condition with diabetic autonomic (poly)neuropathy: Secondary | ICD-10-CM

## 2021-05-25 DIAGNOSIS — N182 Chronic kidney disease, stage 2 (mild): Secondary | ICD-10-CM

## 2021-05-25 DIAGNOSIS — B351 Tinea unguium: Secondary | ICD-10-CM | POA: Diagnosis not present

## 2021-05-25 DIAGNOSIS — M79674 Pain in right toe(s): Secondary | ICD-10-CM

## 2021-05-25 NOTE — Progress Notes (Signed)
This patient returns to my office for at risk foot care.  This patient requires this care by a professional since this patient will be at risk due to having diabetes and CKD.  This patient is unable to cut nails himself since the patient cannot reach his nails.These nails are painful walking and wearing shoes.  This patient presents for at risk foot care today.  General Appearance  Alert, conversant and in no acute stress.  Vascular  Dorsalis pedis and posterior tibial  pulses are palpable  bilaterally.  Capillary return is within normal limits  bilaterally. Temperature is within normal limits  bilaterally.  Neurologic  Senn-Weinstein monofilament wire test within normal limits  bilaterally. Muscle power within normal limits bilaterally.  Nails Thick disfigured discolored nails with subungual debris  from hallux to fifth toes bilaterally. No evidence of bacterial infection or drainage bilaterally.  Orthopedic  No limitations of motion  feet .  No crepitus or effusions noted.  No bony pathology or digital deformities noted.  Skin  normotropic skin with no porokeratosis noted bilaterally.  No signs of infections or ulcers noted.     Onychomycosis  Pain in right toes  Pain in left toes  Consent was obtained for treatment procedures.   Mechanical debridement of nails 1-5  bilaterally performed with a nail nipper.  Filed with dremel without incident.    Return office visit   3 months                   Told patient to return for periodic foot care and evaluation due to potential at risk complications.   Deni Lefever DPM   

## 2021-05-30 ENCOUNTER — Ambulatory Visit: Payer: Medicaid Other | Admitting: Physical Therapy

## 2021-05-30 ENCOUNTER — Encounter: Payer: Self-pay | Admitting: Physical Therapy

## 2021-05-30 DIAGNOSIS — R29898 Other symptoms and signs involving the musculoskeletal system: Secondary | ICD-10-CM | POA: Diagnosis not present

## 2021-05-30 DIAGNOSIS — M542 Cervicalgia: Secondary | ICD-10-CM

## 2021-05-30 DIAGNOSIS — M25612 Stiffness of left shoulder, not elsewhere classified: Secondary | ICD-10-CM

## 2021-05-30 DIAGNOSIS — M25611 Stiffness of right shoulder, not elsewhere classified: Secondary | ICD-10-CM | POA: Diagnosis not present

## 2021-05-30 NOTE — Therapy (Signed)
[facility] [address], [place], [address] Phone: [phone]   Fax:  [phone]  Physical Therapy Treatment  Patient Details  Name: Cory Rivas MRN: [id number] Date of Birth: 02/15/1966 Referring Provider (PT): Gara Kroner, MD   Encounter Date: 05/30/2021   PT End of Session - 05/30/21 1407     Visit Number 13    Number of Visits 17    Date for PT Re-Evaluation 06/07/21    Authorization Type [place] MEDICAID HEALTHY BLUE reporting period from 05/18/2021    Authorization Time Period ref [id number] 7/6-8/31 16 PT visits(will not cover 97012 mechanical traction )    Authorization - Visit Number 12    Authorization - Number of Visits 16    Progress Note Due on Visit 20    PT Start Time 1302    PT Stop Time 1342    PT Time Calculation (min) 40 min    Activity Tolerance Patient tolerated treatment well    Behavior During Therapy [facility] for tasks assessed/performed             Past Medical History:  Diagnosis Date   Bulging of cervical intervertebral disc    Diabetes mellitus without complication ([place])    Gout    High cholesterol    Hypertension    Stress due to family tension 04/15/2019    Past Surgical History:  Procedure Laterality Date   CHOLECYSTECTOMY     COLONOSCOPY WITH PROPOFOL N/A 03/10/2020   Procedure: COLONOSCOPY WITH PROPOFOL;  Surgeon: Lucilla Lame, MD;  Location: [facility] ENDOSCOPY;  Service: Endoscopy;  Laterality: N/A;    There were no vitals filed for this visit.   Subjective Assessment - 05/30/21 1400     Subjective Patient reports that his neck has been good all through the weekend and that he was able to move a lot more until this morning when he took a shower. Patient reports increased neck stiffness/tightness and difficulty moving head in the shower. Patient states that he has gout in R knee and has difficulty bending that knee today. Patient states that he is on gout  medicine currently. Patient also reports increased stress after his good friend died this past week while he was with him. Patient reports that friend had recently been diagnosed with terminal cancer. Lastly, patient states that he has follow up appointment with his neuro surgeon on Thursday.    Patient Stated Goals Increase ROM of cervical spine    Currently in Pain? No/denies    Pain Score 0-No pain    Pain Onset More than a month ago              TREATMENT:  Therapeutic exercise: to centralize symptoms and improve ROM, strength, muscular endurance, and activity tolerance required for successful completion of functional activities.  - farmer carry with 10lb kettle bell, 2x100 feet. Patient held weight in L hand during first set and the weight in R hand during the second set.  Patient R knee pain limited continuation of exercise - deadlift to 18.5 inch chair with 4lb weight, x10.  Patient R knee pain limited continuation of exercise - seated bicep curl 1x12, 2x14 with 7lbs with R hand. 2x10, 1x6 with L hand and 7lbs.  - seated one armed shoulder press 3x10 with 7lb weight in R hand, 3x5 with 7lb weight in L hand. - serratus activation at wall with foam roll 1x15 - standing scapular rows 4x10 at cable  with 25# (cues to keep wrist straight) - seated cervical SNAG rotation, 2x10 seconds bilaterally - seated cervical side bending with overpressure 1x8 each side. Hold for 5 seconds.    Pt required multimodal cuing for proper technique and to facilitate improved neuromuscular control, strength, range of motion, and functional ability resulting in improved performance and form.    PT Education - 05/30/21 1407     Education Details exercise purpose form.    Person(s) Educated Patient    Methods Explanation;Demonstration;Tactile cues;Verbal cues    Comprehension Verbalized understanding;Returned demonstration;Verbal cues required;Tactile cues required              PT Short Term Goals -  04/26/21 1749       PT SHORT TERM GOAL #1   Title Pt will be independent with HEP in order to improve strength and decrease back pain in order to improve pain-free function at home and work.    Baseline Given HEP today.    Time 2    Period Weeks    Status Achieved    Target Date 04/26/21               PT Long Term Goals - 05/18/21 1413       PT LONG TERM GOAL #1   Title Pt will demonstrate decrease in NDI by at least 19% in order to demonstrate clinically significant reduction in disability related to neck injury/pain    Baseline 70% (04/12/21), 46% (05/18/2021)    Time 8    Period Weeks    Status Achieved   Patient unable to read questions, SPT read questions/options and marked pt answers. Pain is no longer patient greatest limiting factor so SPT questions validity of results.     PT LONG TERM GOAL #2   Title Pt will decrease worst neck pain as reported on NPRS by at least 2 points in order to demonstrate clinically significant reduction in back pain.    Baseline W: 9/10 (04/12/09): W: 7/10 provoked with maximum L shoulder abduction. Patient pain with usual activities 0/10 (05/18/2021)    Time 8    Period Weeks    Status Achieved      PT LONG TERM GOAL #3   Title Pt will increase strength of by at least 1/2 MMT grade in order to demonstrate improvement in strength and function.    Baseline 04/12/21: B UE General MMT: 4+/5, 05/18/2021: B UE General MMT (4 - 4+)/5    Time 8    Period Weeks    Status On-going      PT LONG TERM GOAL #4   Title Patient will demonstrate improved function as evidenced by a score of 46 on FOTO measure for full participation in activities at home and in the community.    Baseline 34 (04/12/21), 37 (05/18/2021)    Time 8    Period Weeks    Status On-going                   Plan - 05/30/21 1410     Clinical Impression Statement Patient tolerated treatment well. Session focused on improving patient activity tolerance due to patient complaints  of increased neck stiffness and inability to move neck after performing ADLs. Patient demonstrated improvements in UE/thoracic strength and patient tolerated exercise progression of these activies well. However, patient functional lifting progression was limited by patient R knee pain/gout during today's session. Patient will continue to benefit from skilled physical therapy to address impairments, work towards stated  goals, and maximize functional independence.    Personal Factors and Comorbidities Comorbidity 3+    Comorbidities Diabete, Gout, High Cholesterol, HTN    Examination-Activity Limitations Bend;Carry;Dressing;Hygiene/Grooming;Lift    Examination-Participation Restrictions Laundry;Cleaning    Stability/Clinical Decision Making Stable/Uncomplicated    Rehab Potential Fair    PT Frequency 2x / week    PT Duration 8 weeks    PT Treatment/Interventions ADLs/Self Care Home Management;Cryotherapy;Electrical Stimulation;Moist Heat;Therapeutic activities;Therapeutic exercise;Manual techniques;Dry needling    PT Next Visit Plan Manual therapy to increase tissue restrictions and overall ROM, thoracic mobility, postural and UE strength as tolerated/allowed by precautions    PT Home Exercise Plan Access Code: [place] and Agree with Plan of Care Patient             Patient will benefit from skilled therapeutic intervention in order to improve the following deficits and impairments:  Decreased activity tolerance, Decreased range of motion, Decreased strength, Hypomobility, Pain  Visit Diagnosis: Painful cervical ROM  Decreased range of motion of right shoulder  Shoulder weakness  Decreased range of motion of left shoulder     Problem List Patient Active Problem List   Diagnosis Date Noted   Other chronic pain 70/92/9574   Systolic murmur 73/40/3709   Tobacco use 10/26/2020   Polyp of ascending colon    Rectal polyp    Class 2 severe obesity with serious  comorbidity and body mass index (BMI) of 39.0 to 39.9 in adult ([place]) 04/15/2019   Chronic kidney disease (CKD) stage G2/A3, mildly decreased glomerular filtration rate (GFR) between 60-89 mL/min/1.73 square meter and albuminuria creatinine ratio greater than 300 mg/g 04/15/2019   Proteinuria 04/15/2019   Hyperlipidemia associated with type 2 diabetes mellitus ([place]) 03/18/2019   Diabetes mellitus ([place]) 03/18/2019   Idiopathic chronic gout of multiple sites without tophus 03/18/2019   Benign essential hypertension 03/18/2019   Dermatitis 03/18/2019   Lumbar degenerative disc disease 11/22/2015   Cervical myelopathy ([place]) 11/22/2015   Osteoarthritis of spine with radiculopathy, lumbar region 11/22/2015   Sherryll Burger, SPT  Student physical therapist under direct supervision of licensed physical therapists during the entirety of the session.   Everlean Alstrom. Graylon Good, PT, DPT 05/30/21, 7:00 PM   [facility] [address], [place], [address] Phone: [phone]   Fax:  [phone]  Name: EMETERIO BALKE MRN: [id number] Date of Birth: 05/16/1966

## 2021-06-01 DIAGNOSIS — M4322 Fusion of spine, cervical region: Secondary | ICD-10-CM | POA: Diagnosis not present

## 2021-06-01 DIAGNOSIS — Z981 Arthrodesis status: Secondary | ICD-10-CM | POA: Diagnosis not present

## 2021-06-06 ENCOUNTER — Ambulatory Visit: Payer: Medicaid Other | Admitting: Physical Therapy

## 2021-06-06 NOTE — Addendum Note (Signed)
Addended by: Rosita Kea R on: 06/06/2021 02:57 PM   Modules accepted: Orders

## 2021-06-07 DIAGNOSIS — Z20822 Contact with and (suspected) exposure to covid-19: Secondary | ICD-10-CM | POA: Diagnosis not present

## 2021-06-07 DIAGNOSIS — Z03818 Encounter for observation for suspected exposure to other biological agents ruled out: Secondary | ICD-10-CM | POA: Diagnosis not present

## 2021-06-15 ENCOUNTER — Ambulatory Visit: Payer: Medicaid Other | Attending: Neurological Surgery

## 2021-06-15 DIAGNOSIS — M25611 Stiffness of right shoulder, not elsewhere classified: Secondary | ICD-10-CM | POA: Insufficient documentation

## 2021-06-15 DIAGNOSIS — R29898 Other symptoms and signs involving the musculoskeletal system: Secondary | ICD-10-CM | POA: Insufficient documentation

## 2021-06-15 DIAGNOSIS — M25612 Stiffness of left shoulder, not elsewhere classified: Secondary | ICD-10-CM | POA: Diagnosis not present

## 2021-06-15 DIAGNOSIS — M542 Cervicalgia: Secondary | ICD-10-CM | POA: Diagnosis not present

## 2021-06-15 NOTE — Therapy (Signed)
[facility] [address] [place], [place], [address] Phone: [phone]   Fax:  [phone]                       Physical Therapy Treatment/Re-Certification                          Dates of Reporting: 05/18/2021 - 06/15/2021  Patient Details  Name: Cory Rivas MRN: [id number] Date of Birth: [birth year] Referring Provider (PT): Gara Kroner, MD   Encounter Date: 06/15/2021   PT End of Session - 06/15/21 1456     Visit Number 14    Number of Visits 25    Date for PT Re-Evaluation 07/13/21    Authorization Type [place] MEDICAID HEALTHY BLUE reporting period from 05/18/2021    Authorization Time Period ref [id number] 7/6-8/31 16 PT visits(will not cover 97012 mechanical traction )    Authorization - Visit Number 13    Authorization - Number of Visits 16    Progress Note Due on Visit 20    PT Start Time 0086    PT Stop Time 1428    PT Time Calculation (min) 43 min    Activity Tolerance Patient tolerated treatment well    Behavior During Therapy [facility] for tasks assessed/performed             Past Medical History:  Diagnosis Date   Bulging of cervical intervertebral disc    Diabetes mellitus without complication ([place])    Gout    High cholesterol    Hypertension    Stress due to family tension 04/15/2019    Past Surgical History:  Procedure Laterality Date   CHOLECYSTECTOMY     COLONOSCOPY WITH PROPOFOL N/A 03/10/2020   Procedure: COLONOSCOPY WITH PROPOFOL;  Surgeon: Lucilla Lame, MD;  Location: [facility] ENDOSCOPY;  Service: Endoscopy;  Laterality: N/A;    There were no vitals filed for this visit.   Subjective Assessment - 06/15/21 1454     Subjective Patient states that he got bronchitis and chest cold since last appointment. Patient is doing well now and feeling much better. Patient states that he would like to continue with PT at this time to work on decreasing stiffness, rotating his head and, improve cervical  extension. Patient did not discuss current pain, but states that pain in back of head and neck is worst at night when sleeping (4/10 pain rating) since starting PT.    Patient Stated Goals Increase ROM of cervical spine    Currently in Pain? No/denies    Pain Onset More than a month ago              OBJECTIVE:  FOTO: 40   PHYSICAL PERFORMANCE:  Strength R/L Shoulder flexion (anterior deltoid/pec major/coracobrachialis, axillary n. (C5/6) and musculocutaneous n. (C5-7)) 5/4 Shoulder abduction (deltoid/supraspinatus, axillary/suprascapular n, C5) 5/4+  Elbow flexion (biceps brachii, brachialis, brachioradialis, musculoskeletal n, C5/6)  5/4+  Elbow extension (triceps, radial n, C7) - 5/5 Wrist Extension (C6/7) - 5/5 Wrist Flexion (C6/7) - 5/5      AROM: (Measurements taken in degrees) R/L   164/161 Shoulder Flexion    174/174* Shoulder Abduction    21 - Cervical Flexion 10 - Cervical Extension 15/22 - Cervical Lateral Flexion 24/39 - Cervical Rotation    Treatment  Therapeutic exercise: to centralize symptoms and improve ROM, strength, muscular endurance, and activity tolerance required for successful completion  of functional activities.   - seated one armed shoulder press 2x10 with 7lb weight in R hand, 2x10 with 5lb weight in L hand. - supine cervical extension with towel under neck, x5 with 15 second hold.  - Education on HEP including handout    Pt required multimodal cuing for proper technique and to facilitate improved neuromuscular control, strength, range of motion, and functional ability resulting in improved performance and form.   HOME EXERCISE PLAN Access Code: [email or web address] URL: [email or web address] Date: 06/15/2021 Prepared by: Rosita Kea  Exercises Seated Cervical Flexion AROM - 1 x daily - 7 x weekly - 3 sets - 10 reps Seated Cervical Extension AROM - 1 x daily - 7 x weekly - 3 sets - 10 reps Seated Cervical Sidebending AROM - 1 x daily  - 7 x weekly - 3 sets - 10 reps Seated Cervical Rotation AROM - 1 x daily - 7 x weekly - 3 sets - 10 reps Seated Cervical Retraction and Extension - 1 x daily - 7 x weekly - 3 sets - 10 reps Seated Cervical Retraction - 1 x daily - 7 x weekly - 3 sets - 10 reps Standing Ulnar Nerve Glide - 1 x daily - 1 sets - 15 reps - 2-3 seconds hold Standing Median Nerve Glide - 1 x daily - 1 sets - 15 reps - 2-3 seconds hold Hooklying Upper Neck Extension - 2-3 x daily - 7 x weekly - 1 sets - 4 reps - 15 sec hold    PT Education - 06/15/21 1456     Education Details exercise purpose form    Person(s) Educated Patient    Methods Explanation;Demonstration;Tactile cues;Verbal cues;Handout    Comprehension Verbalized understanding;Returned demonstration;Verbal cues required;Tactile cues required              PT Short Term Goals - 04/26/21 1749       PT SHORT TERM GOAL #1   Title Pt will be independent with HEP in order to improve strength and decrease back pain in order to improve pain-free function at home and work.    Baseline Given HEP today.    Time 2    Period Weeks    Status Achieved    Target Date 04/26/21               PT Long Term Goals - 06/15/21 1732       PT LONG TERM GOAL #1   Title Pt will demonstrate decrease in NDI by at least 19% in order to demonstrate clinically significant reduction in disability related to neck injury/pain    Baseline 70% (04/12/21), 46% (05/18/2021)    Time 12    Period Weeks   UPDATED TARGET DATE FOR ALL LONG TERM GOALS: 08/10/2021   Status Achieved   Patient unable to read questions, SPT read questions/options and marked pt answers. Pain is no longer patient greatest limiting factor so SPT questions validity of results.     PT LONG TERM GOAL #2   Title Pt will decrease worst neck pain as reported on NPRS by at least 2 points in order to demonstrate clinically significant reduction in back pain.    Baseline W: 9/10 (04/12/09): W: 7/10 provoked  with maximum L shoulder abduction. Patient pain with usual activities 0/10 (05/18/2021); patient reports worst pain as 4/10 while sleeping (06/15/2021)    Time 12    Period Weeks    Status Achieved      PT LONG TERM GOAL #3  Title Pt will increase strength of by at least 1/2 MMT grade in order to demonstrate improvement in strength and function.    Baseline 04/12/21: B UE General MMT: 4+/5, 05/18/2021: B UE General MMT (4 - 4+)/5, 06/15/2021: L elbow flexion, shoulder abduction, and shoulder flexion (4 - 4+)/5.    Time 12    Period Weeks    Status Partially Met    Target Date 07/13/21      PT LONG TERM GOAL #4   Title Patient will demonstrate improved function as evidenced by a score of 46 on FOTO measure for full participation in activities at home and in the community.    Baseline 34 (04/12/21), 37 (05/18/2021), 40 (06/15/2021)    Time 12    Period Weeks    Status On-going    Target Date 07/13/21      PT LONG TERM GOAL #5   Title Improve cervical AROM rotation to equal or greater than 45 degrees each direction and cervical extension to 15 degrees to improve ability to check blind spot when driving, view surroundings, and allow patient to complete valued activities with less difficulty.    Baseline Rotation (L/R) 24/39 + extension 10 (06/15/2021)    Time 4    Period Weeks    Status New    Target Date 07/13/21      Additional Long Term Goals   Additional Long Term Goals Yes      PT LONG TERM GOAL #6   Title Patient will be able to lift a 10lb weight overhead x5  with each shoulder independently in order to improve ability to lift/put away groceries and perform ADL's such as cleaning, dressing, bathing, performing household/yard activities, and grooming.    Baseline Patient is able to perform seated one armed shoulder press 2x10 with 7lb weight in R hand, 2x10 with 5lb weight in L hand (06/15/2021)    Time 4    Period Weeks    Status New    Target Date 07/13/21                   Plan  - 06/15/21 1731     Clinical Impression Statement Patient has attended 14 physical therapy sessions this episode of care has met or nearly met all of his stated goals in his original POC. Patient has made improvement in FOTO score from 34 to 40 indicate improvement in overall condition and patient-reported functional ability since starting physical therapy. Patient also continues to demonstrate improvement in cervical ROM, shoulder ROM, and UE MMT per objective goniometry and MMT measurements. However, patient cervical extension and rotation remains limited and has the potential to further improve in response to therapy. Patient limited cervical mobility limits patient community participation, ability to perform ADL's including cleaning, dressing, bathing, and grooming and safely drive his car. Patient impaired UE strength limits patient functional ability as well. Additional goals were incorporated into the patients POC to address patient remaining strength and mobility impairments. Patient would benefit from continued management of limiting condition by skilled physical therapist to address these remaining impairments and functional limitations to work towards stated goals and return to PLOF or maximal functional independence.    Personal Factors and Comorbidities Comorbidity 3+    Comorbidities Diabete, Gout, High Cholesterol, HTN    Examination-Activity Limitations Bend;Carry;Dressing;Hygiene/Grooming;Lift    Examination-Participation Restrictions Laundry;Cleaning    Stability/Clinical Decision Making Stable/Uncomplicated    Rehab Potential Fair    PT Frequency 2x / week  PT Duration 12 weeks    PT Treatment/Interventions ADLs/Self Care Home Management;Cryotherapy;Electrical Stimulation;Moist Heat;Therapeutic activities;Therapeutic exercise;Manual techniques;Dry needling    PT Next Visit Plan Manual therapy to increase tissue restrictions and overall ROM, thoracic mobility, postural and UE strength  as tolerated/allowed by precautions    PT Home Exercise Plan Access Code: [email or web address]    Consulted and Agree with Plan of Care Patient             Patient will benefit from skilled therapeutic intervention in order to improve the following deficits and impairments:  Decreased activity tolerance, Decreased range of motion, Decreased strength, Hypomobility, Pain  Visit Diagnosis: Painful cervical ROM  Decreased range of motion of right shoulder  Shoulder weakness  Decreased range of motion of left shoulder     Problem List Patient Active Problem List   Diagnosis Date Noted   Other chronic pain 00/37/9444   Systolic murmur 61/90/1222   Tobacco use 10/26/2020   Polyp of ascending colon    Rectal polyp    Class 2 severe obesity with serious comorbidity and body mass index (BMI) of 39.0 to 39.9 in adult ([place]) 04/15/2019   Chronic kidney disease (CKD) stage G2/A3, mildly decreased glomerular filtration rate (GFR) between 60-89 mL/min/1.73 square meter and albuminuria creatinine ratio greater than 300 mg/g 04/15/2019   Proteinuria 04/15/2019   Hyperlipidemia associated with type 2 diabetes mellitus ([place]) 03/18/2019   Diabetes mellitus ([place]) 03/18/2019   Idiopathic chronic gout of multiple sites without tophus 03/18/2019   Benign essential hypertension 03/18/2019   Dermatitis 03/18/2019   Lumbar degenerative disc disease 11/22/2015   Cervical myelopathy ([place]) 11/22/2015   Osteoarthritis of spine with radiculopathy, lumbar region 11/22/2015   Sherryll Burger, SPT  Salem Caster. Fairly IV, PT, DPT Physical Therapist- [facility]  06/15/2021, 6:37 PM  [facility] [address] [place], [address] Phone: [phone]   Fax:  [phone]  Name: Cory Rivas MRN: [id number] Date of Birth: 01-18-1966

## 2021-06-20 ENCOUNTER — Ambulatory Visit: Payer: Medicaid Other | Admitting: Physical Therapy

## 2021-06-20 ENCOUNTER — Encounter: Payer: Self-pay | Admitting: Physical Therapy

## 2021-06-20 DIAGNOSIS — M542 Cervicalgia: Secondary | ICD-10-CM

## 2021-06-20 DIAGNOSIS — R29898 Other symptoms and signs involving the musculoskeletal system: Secondary | ICD-10-CM

## 2021-06-20 DIAGNOSIS — M25612 Stiffness of left shoulder, not elsewhere classified: Secondary | ICD-10-CM

## 2021-06-20 DIAGNOSIS — M25611 Stiffness of right shoulder, not elsewhere classified: Secondary | ICD-10-CM

## 2021-06-20 NOTE — Therapy (Signed)
[facility] [address], [place], [address] Phone: [phone]   Fax:  [phone]  Physical Therapy Discharge Summary Dates of reporting: 04/12/2021 - 06/20/2021  Patient Details  Name: Cory Rivas MRN: [id number] Date of Birth: 1966-02-10 Referring Provider (PT): Gara Kroner, MD   Encounter Date: 06/20/2021   PT End of Session - 06/20/21 1531     Visit Number 15    Number of Visits 25    Date for PT Re-Evaluation 07/13/21    Authorization Type [place] [employer] [place]UE reporting period from 05/18/2021    Authorization Time Period ref [id number] 7/6-8/31 16 PT visits(will not cover [address] mechanical traction )    Authorization - Visit Number 0    Authorization - Number of Visits 0    Progress Note Due on Visit 20    PT Start Time 1355    PT Stop Time 1430    PT Time Calculation (min) 35 min    Activity Tolerance Patient tolerated treatment well    Behavior During Therapy [facility] for tasks assessed/performed             Past Medical History:  Diagnosis Date   Bulging of cervical intervertebral disc    Diabetes mellitus without complication ([place])    Gout    High cholesterol    Hypertension    Stress due to family tension 04/15/2019    Past Surgical History:  Procedure Laterality Date   CHOLECYSTECTOMY     COLONOSCOPY WITH PROPOFOL N/A 03/10/2020   Procedure: COLONOSCOPY WITH PROPOFOL;  Surgeon: Lucilla Lame, MD;  Location: [facility] ENDOSCOPY;  Service: Endoscopy;  Laterality: N/A;    There were no vitals filed for this visit.   Subjective Assessment - 06/20/21 1405     Subjective Patient reports he feels significantly stronger since starting PT. He understands today is his last day due to lack of further insurance authorization. Reports no pain today. Feels a little tight from driving. Feels confortable with his current HEP, but has not been doing much activity at home. States he has been to [facility]  cl[facility] before and is interested in continuing his care there if possible.    Patient Stated Goals Increase ROM of cervical spine    Currently in Pain? No/denies    Pain Onset More than a month ago            OBJECTIVE See measurements from progress note last session   Exercises/interventions: - standing scapular row with black theraband, 2x15/16 - standing bicep curls with green theraband under foot, 1x10 both sides together.  - seated overhead press with green theraband, 3x10 each side (2 sets on left with red theraband but too easy).  - seated chest press with plus with green theraand, 3x10 - seated thoracic extension AROM with hands behind head, 1x10 - standing pec stretch in doorway, 1x7 with 5 second holds, 1x30second hold.  - standing wall angel 1x10  - Education on HEP including handout    HOME EXERCISE PROGRAM Access Code: [email or web address] URL: [email or web address] Date: 06/20/2021 Prepared by: Rosita Kea  Exercises Seated Cervical Flexion AROM - 1 x daily - 7 x weekly - 3 sets - 10 reps Seated Cervical Extension AROM - 1 x daily - 7 x weekly - 3 sets - 10 reps Seated Cervical Sidebending AROM - 1 x daily - 7 x weekly - 3 sets - 10 reps Seated Assisted Cervical Rotation with Towel - 1 x  daily - 2 sets - 10 reps - 5 seconds hold Seated Cervical Retraction - 1 x daily - 7 x weekly - 3 sets - 10 reps Standing Ulnar Nerve Glide - 1 x daily - 1 sets - 15 reps - 2-3 seconds hold Standing Median Nerve Glide - 1 x daily - 1 sets - 15 reps - 2-3 seconds hold Hooklying Upper Neck Extension - 2-3 x daily - 7 x weekly - 1 sets - 4 reps - 15 sec hold Row with band/cable - 1 x daily - 3 sets - 10-20 reps Standing Bicep Curls with Resistance - 1 x daily - 3 sets - 10-20 reps Shoulder Press with Resistance Band - 1 x daily - 3 sets - 10 reps Seated Chest Press with Resistance Band - 1 x daily - 3 sets - 10 reps Doorway Pec Stretch at 90 Degrees Abduction - 1 x daily - 3  reps - 30 seconds hold Wall Angels - 1 x daily - 2-3 sets - 10 reps   PT Education - 06/20/21 1531     Education Details exercise purpose form. discharge reccomendations. HEP    Person(s) Educated Patient    Methods Explanation;Demonstration;Tactile cues;Verbal cues;Handout    Comprehension Verbalized understanding;Returned demonstration              PT Short Term Goals - 04/26/21 1749       PT SHORT TERM GOAL #1   Title Pt will be independent with HEP in order to improve strength and decrease back pain in order to improve pain-free function at home and work.    Baseline Given HEP today.    Time 2    Period Weeks    Status Achieved    Target Date 04/26/21               PT Long Term Goals - 06/15/21 1732       PT LONG TERM GOAL #1   Title Pt will demonstrate decrease in NDI by at least 19% in order to demonstrate clinically significant reduction in disability related to neck injury/pain    Baseline 70% (04/12/21), 46% (05/18/2021)    Time 12    Period Weeks   UPDATED TARGET DATE FOR ALL LONG TERM GOALS: 08/10/2021   Status Achieved   Patient unable to read questions, SPT read questions/options and marked pt answers. Pain is no longer patient greatest limiting factor so SPT questions validity of results.     PT LONG TERM GOAL #2   Title Pt will decrease worst neck pain as reported on NPRS by at least 2 points in order to demonstrate clinically significant reduction in back pain.    Baseline W: 9/10 (04/12/09): W: 7/10 provoked with maximum L shoulder abduction. Patient pain with usual activities 0/10 (05/18/2021); patient reports worst pain as 4/10 while sleeping (06/15/2021)    Time 12    Period Weeks    Status Achieved      PT LONG TERM GOAL #3   Title Pt will increase strength of by at least 1/2 MMT grade in order to demonstrate improvement in strength and function.    Baseline 04/12/21: B UE General MMT: 4+/5, 05/18/2021: B UE General MMT (4 - 4+)/5, 06/15/2021: L elbow  flexion, shoulder abduction, and shoulder flexion (4 - 4+)/5.    Time 12    Period Weeks    Status Partially Met    Target Date 07/13/21      PT LONG TERM GOAL #4  Title Patient will demonstrate improved function as evidenced by a score of 46 on FOTO measure for full participation in activities at home and in the community.    Baseline 34 (04/12/21), 37 (05/18/2021), 40 (06/15/2021)    Time 12    Period Weeks    Status On-going    Target Date 07/13/21      PT LONG TERM GOAL #5   Title Improve cervical AROM rotation to equal or greater than 45 degrees each direction and cervical extension to 15 degrees to improve ability to check blind spot when driving, view surroundings, and allow patient to complete valued activities with less difficulty.    Baseline Rotation (L/R) 24/39 + extension 10 (06/15/2021)    Time 4    Period Weeks    Status New    Target Date 07/13/21      Additional Long Term Goals   Additional Long Term Goals Yes      PT LONG TERM GOAL #6   Title Patient will be able to lift a 10lb weight overhead x5  with each shoulder independently in order to improve ability to lift/put away groceries and perform ADL's such as cleaning, dressing, bathing, performing household/yard activities, and grooming.    Baseline Patient is able to perform seated one armed shoulder press 2x10 with 7lb weight in R hand, 2x10 with 5lb weight in L hand (06/15/2021)    Time 4    Period Weeks    Status New    Target Date 07/13/21                   Plan - 06/20/21 1537     Clinical Impression Statement Patient has attended 15 physical therapy sessions this episode of care. He has met or nearly met all of his stated goals. He has made improvements in FOTO Score from 34 to 40 indicating improvement in self-reported function. He also demonstrates improved cervical spine ROM, shoulder ROM and UE MMT. Patient continues to be very limited in cervical spine motion and activity tolerance that limits his  ability to participate in ADLs, including cleaning, dressing, bathing, grooming, and safely driving his car. PT recommends that patient continue physical therapy to improve functional activity tolerance and continue to address remaining impairments, but he is now discharged due to lack of insurance authorization for further physical therapy. Patient has been provided with a robust long term HEP and has been recommended to inquire about continuing care at local [facility] clinic for uninsured or underinsured patients.    Personal Factors and Comorbidities Comorbidity 3+    Comorbidities Diabete, Gout, High Cholesterol, HTN    Examination-Activity Limitations Bend;Carry;Dressing;Hygiene/Grooming;Lift    Examination-Participation Restrictions Laundry;Cleaning    Stability/Clinical Decision Making Stable/Uncomplicated    Rehab Potential Fair    PT Frequency 2x / week    PT Duration 12 weeks    PT Treatment/Interventions ADLs/Self Care Home Management;Cryotherapy;Electrical Stimulation;Moist Heat;Therapeutic activities;Therapeutic exercise;Manual techniques;Dry needling    PT Next Visit Plan Patient is now discharged from PT due to lack of further insurance authorization.    PT Home Exercise Plan Medbridge Access Code: [email or web address]    Consulted and Agree with Plan of Care Patient             Patient will benefit from skilled therapeutic intervention in order to improve the following deficits and impairments:  Decreased activity tolerance, Decreased range of motion, Decreased strength, Hypomobility, Pain  Visit Diagnosis: Painful cervical ROM  Decreased range of motion of right  shoulder  Shoulder weakness  Decreased range of motion of left shoulder     Problem List Patient Active Problem List   Diagnosis Date Noted   Other chronic pain 71/25/2479   Systolic murmur 98/00/1239   Tobacco use 10/26/2020   Polyp of ascending colon    Rectal polyp    Class 2 severe obesity with serious  comorbidity and body mass index (BMI) of 39.0 to 39.9 in adult [facility]) 04/15/2019   Chronic kidney disease (CKD) stage G2/A3, mildly decreased glomerular filtration rate (GFR) between 60-89 mL/min/1.73 square meter and albuminuria creatinine ratio greater than 300 mg/g 04/15/2019   Proteinuria 04/15/2019   Hyperlipidemia associated with type 2 diabetes mellitus ([place]) 03/18/2019   Diabetes mellitus ([place]) 03/18/2019   Idiopathic chronic gout of multiple sites without tophus 03/18/2019   Benign essential hypertension 03/18/2019   Dermatitis 03/18/2019   Lumbar degenerative disc disease 11/22/2015   Cervical myelopathy ([place]) 11/22/2015   Osteoarthritis of spine with radiculopathy, lumbar region 11/22/2015    Everlean Alstrom. Graylon Good, PT, DPT 06/20/21, 3:38 PM  [facility] [address] [place], [address] Phone: [phone]   Fax:  [phone]  Name: Cory Rivas MRN: [id number] Date of Birth: 1965/12/15

## 2021-06-22 ENCOUNTER — Encounter: Payer: Medicaid Other | Admitting: Physical Therapy

## 2021-06-27 ENCOUNTER — Encounter: Payer: Medicaid Other | Admitting: Physical Therapy

## 2021-06-29 ENCOUNTER — Encounter: Payer: Medicaid Other | Admitting: Physical Therapy

## 2021-06-29 DIAGNOSIS — Z794 Long term (current) use of insulin: Secondary | ICD-10-CM | POA: Diagnosis not present

## 2021-06-29 DIAGNOSIS — E1165 Type 2 diabetes mellitus with hyperglycemia: Secondary | ICD-10-CM | POA: Diagnosis not present

## 2021-06-29 DIAGNOSIS — Z5181 Encounter for therapeutic drug level monitoring: Secondary | ICD-10-CM | POA: Diagnosis not present

## 2021-06-29 LAB — HEMOGLOBIN A1C: Hemoglobin A1C: 11

## 2021-07-03 ENCOUNTER — Other Ambulatory Visit: Payer: Self-pay

## 2021-07-03 ENCOUNTER — Emergency Department
Admission: EM | Admit: 2021-07-03 | Discharge: 2021-07-03 | Disposition: A | Discharge status: Home / Self Care | Payer: Medicaid Other | Attending: Emergency Medicine | Admitting: Emergency Medicine

## 2021-07-03 ENCOUNTER — Emergency Department: Payer: Medicaid Other

## 2021-07-03 DIAGNOSIS — Z7984 Long term (current) use of oral hypoglycemic drugs: Secondary | ICD-10-CM | POA: Insufficient documentation

## 2021-07-03 DIAGNOSIS — N1831 Chronic kidney disease, stage 3a: Secondary | ICD-10-CM | POA: Insufficient documentation

## 2021-07-03 DIAGNOSIS — Z794 Long term (current) use of insulin: Secondary | ICD-10-CM | POA: Diagnosis not present

## 2021-07-03 DIAGNOSIS — R739 Hyperglycemia, unspecified: Secondary | ICD-10-CM

## 2021-07-03 DIAGNOSIS — E1122 Type 2 diabetes mellitus with diabetic chronic kidney disease: Secondary | ICD-10-CM | POA: Insufficient documentation

## 2021-07-03 DIAGNOSIS — R112 Nausea with vomiting, unspecified: Secondary | ICD-10-CM | POA: Insufficient documentation

## 2021-07-03 DIAGNOSIS — R519 Headache, unspecified: Secondary | ICD-10-CM | POA: Diagnosis not present

## 2021-07-03 DIAGNOSIS — E1165 Type 2 diabetes mellitus with hyperglycemia: Secondary | ICD-10-CM | POA: Insufficient documentation

## 2021-07-03 DIAGNOSIS — F17210 Nicotine dependence, cigarettes, uncomplicated: Secondary | ICD-10-CM | POA: Diagnosis not present

## 2021-07-03 DIAGNOSIS — Z79899 Other long term (current) drug therapy: Secondary | ICD-10-CM | POA: Diagnosis not present

## 2021-07-03 DIAGNOSIS — I129 Hypertensive chronic kidney disease with stage 1 through stage 4 chronic kidney disease, or unspecified chronic kidney disease: Secondary | ICD-10-CM | POA: Diagnosis not present

## 2021-07-03 LAB — URINALYSIS, COMPLETE (UACMP) WITH MICROSCOPIC
Bilirubin Urine: NEGATIVE
Glucose, UA: 100 mg/dL — AB
Ketones, ur: NEGATIVE mg/dL
Leukocytes,Ua: NEGATIVE
Nitrite: NEGATIVE
Protein, ur: >300 mg/dL — AB
Specific Gravity, Urine: >1.03 — ABNORMAL HIGH (ref 1.005–1.030)
pH: 5.5 (ref 5.0–8.0)

## 2021-07-03 LAB — CBC
HCT: 45.2 % (ref 39.0–52.0)
Hemoglobin: 15.8 g/dL (ref 13.0–17.0)
MCH: 28.9 pg (ref 26.0–34.0)
MCHC: 35 g/dL (ref 30.0–36.0)
MCV: 82.6 fL (ref 80.0–100.0)
Platelets: 224 10*3/uL (ref 150–400)
RBC: 5.47 MIL/uL (ref 4.22–5.81)
RDW: 13.5 % (ref 11.5–15.5)
WBC: 9.9 10*3/uL (ref 4.0–10.5)
nRBC: 0 % (ref 0.0–0.2)

## 2021-07-03 LAB — HEPATIC FUNCTION PANEL
ALT: 16 U/L (ref 0–44)
AST: 15 U/L (ref 15–41)
Albumin: 3.6 g/dL (ref 3.5–5.0)
Alkaline Phosphatase: 71 U/L (ref 38–126)
Bilirubin, Direct: 0.1 mg/dL (ref 0.0–0.2)
Indirect Bilirubin: 0.8 mg/dL (ref 0.3–0.9)
Total Bilirubin: 0.9 mg/dL (ref 0.3–1.2)
Total Protein: 6.8 g/dL (ref 6.5–8.1)

## 2021-07-03 LAB — BASIC METABOLIC PANEL
Anion gap: 8 (ref 5–15)
BUN: 15 mg/dL (ref 6–20)
CO2: 27 mmol/L (ref 22–32)
Calcium: 8.7 mg/dL — ABNORMAL LOW (ref 8.9–10.3)
Chloride: 101 mmol/L (ref 98–111)
Creatinine, Ser: 0.95 mg/dL (ref 0.61–1.24)
GFR, Estimated: >60 mL/min (ref >60)
Glucose, Bld: 195 mg/dL — ABNORMAL HIGH (ref 70–99)
Potassium: 3.7 mmol/L (ref 3.5–5.1)
Sodium: 136 mmol/L (ref 135–145)

## 2021-07-03 LAB — CBG MONITORING, ED: Glucose-Capillary: 203 mg/dL — ABNORMAL HIGH (ref 70–99)

## 2021-07-03 MED ORDER — KETOROLAC TROMETHAMINE 30 MG/ML IJ SOLN
15.0000 mg | Freq: Once | INTRAMUSCULAR | Status: AC
  Administered 2021-07-03: 15 mg via INTRAVENOUS
  Filled 2021-07-03: qty 1

## 2021-07-03 MED ORDER — SODIUM CHLORIDE 0.9 % IV BOLUS
1000.0000 mL | Freq: Once | INTRAVENOUS | Status: AC
  Administered 2021-07-03: 1000 mL via INTRAVENOUS

## 2021-07-03 MED ORDER — ONDANSETRON 4 MG PO TBDP
4.0000 mg | ORAL_TABLET | Freq: Once | ORAL | Status: AC
  Administered 2021-07-03: 4 mg via ORAL
  Filled 2021-07-03: qty 1

## 2021-07-03 MED ORDER — METOCLOPRAMIDE HCL 5 MG/ML IJ SOLN
10.0000 mg | Freq: Once | INTRAMUSCULAR | Status: AC
  Administered 2021-07-03: 10 mg via INTRAVENOUS
  Filled 2021-07-03: qty 2

## 2021-07-03 NOTE — ED Provider Notes (Signed)
[facility]  ____________________________________________   Event Date/Time   First MD Initiated Contact with Patient 07/03/21 1530     (approximate)  I have reviewed the triage vital signs and the nursing notes.   HISTORY  Chief Complaint Hyperglycemia    HPI Cory Rivas is a 55 y.o. male with past medical history of diabetes, gout, hypertension who presents with headache and vomiting.  Symptoms started several days ago.  Patient has just felt generally unwell has had several episodes of emesis.  However he denies abdominal pain diarrhea.  He has also had a mild headache that is located in the frontal region above both of his eyes.  Headache has been constant, it is not severe.  He denies associated visual change, numbness or weakness.  No neck pain.  He denies fevers or chills.  Patient mainly came to the ED today because his blood sugar was in the 300s at home.  Normally is in the 120s to 140s.  He has been compliant with his medication.  Triage documentation says that he has a history of cerebral aneurysm, however when I asked the patient about this he denied having any history of aneurysm.  Additionally I cannot find any documentation of this in his chart.         Past Medical History:  Diagnosis Date   Bulging of cervical intervertebral disc    Diabetes mellitus without complication ([facility])    Gout    High cholesterol    Hypertension    Stress due to family tension 04/15/2019    Patient Active Problem List   Diagnosis Date Noted   Other chronic pain 74/25/9563   Systolic murmur 87/56/4332   Tobacco use 10/26/2020   Polyp of ascending colon    Rectal polyp    Class 2 severe obesity with serious comorbidity and body mass index (BMI) of 39.0 to 39.9 in adult [facility]) 04/15/2019   Chronic kidney disease (CKD) stage G2/A3, mildly decreased glomerular filtration rate (GFR) between 60-89 mL/min/1.73 square meter and albuminuria creatinine ratio greater  than 300 mg/g 04/15/2019   Proteinuria 04/15/2019   Hyperlipidemia associated with type 2 diabetes mellitus ([place]) 03/18/2019   Diabetes mellitus ([place]) 03/18/2019   Idiopathic chronic gout of multiple sites without tophus 03/18/2019   Benign essential hypertension 03/18/2019   Dermatitis 03/18/2019   Lumbar degenerative disc disease 11/22/2015   Cervical myelopathy ([place]) 11/22/2015   Osteoarthritis of spine with radiculopathy, lumbar region 11/22/2015    Past Surgical History:  Procedure Laterality Date   CHOLECYSTECTOMY     COLONOSCOPY WITH PROPOFOL N/A 03/10/2020   Procedure: COLONOSCOPY WITH PROPOFOL;  Surgeon: Lucilla Lame, MD;  Location[facility] ENDOSCOPY;  Service: Endoscopy;  Laterality: N/A;    Prior to Admission medications   Medication Sig Start Date End Date Taking? Authorizing Provider  acetaminophen (TYLENOL) 325 MG tablet SMARTSIG:3 Tablet(s) By Mouth Every 8 Hours PRN 02/07/21   [provider]  allopurinol (ZYLOPRIM) 300 MG tablet Take 1 tablet (300 mg total) by mouth daily. 04/10/21   Delsa Grana, PA-C  amLODipine (NORVASC) 10 MG tablet Take 1 tablet (10 mg total) by mouth daily. 01/26/21   Just, Laurita Quint, FNP  Continuous Blood Gluc Receiver (FREESTYLE LIBRE 2 READER) DEVI  11/28/20   [provider]  Continuous Blood Gluc Sensor (FREESTYLE LIBRE 2 SENSOR) MISC  01/18/21   [provider]  Dulaglutide 1.5 MG/0.5ML SOPN Inject into the skin. 12/16/20   [provider]  DULoxetine (  CYMBALTA) 30 MG capsule Take 1 capsule (30 mg total) by mouth 2 (two) times daily. 09/30/20   Delsa Grana, PA-C  indomethacin (INDOCIN) 25 MG capsule Take 50 mg PO TID x 2 d at onset of gout flare, then take 25 mg po TID x 3 d 03/24/20   Delsa Grana, PA-C  insulin glargine (LANTUS SOLOSTAR) 100 UNIT/ML Solostar Pen Inject into the skin. 11/01/20 11/01/21  [provider]  insulin lispro (HUMALOG) 100 UNIT/ML KwikPen By correction scale based on pre-meal blood  sugar. 2 units/ 50 over 150. TDD up to 45 units 11/08/20   [provider]  lisinopril (ZESTRIL) 40 MG tablet Take 1 tablet (40 mg total) by mouth daily. 01/26/21   Just, Laurita Quint, FNP  meloxicam (MOBIC) 15 MG tablet Take 1 tablet (15 mg total) by mouth daily. 09/26/20   Cuthriell, Charline Bills, PA-C  metFORMIN (GLUCOPHAGE) 1000 MG tablet TAKE 1 TABLET BY MOUTH TWICE A DAY WITH A MEAL 02/20/21   Delsa Grana, PA-C  oxyCODONE (OXY IR/ROXICODONE) 5 MG immediate release tablet Take by mouth. 02/16/21   [provider]  pregabalin (LYRICA) 25 MG capsule TAKE 1 CAPSULE BY MOUTH IN THE MORNING AND 1 TO 3 CAPSULES EVERY NIGHT FOR CERVICAL RADICULOPATHY AND NEUROPATHY 05/01/21   Delsa Grana, PA-C  promethazine (PHENERGAN) 12.5 MG tablet Take by mouth. 02/11/21 02/18/21  [provider]  rosuvastatin (CRESTOR) 40 MG tablet Take 1 tablet (40 mg total) by mouth daily. 01/26/21   Just, Laurita Quint, FNP  triamcinolone cream (KENALOG) 0.1 % Apply 1 application topically 2 (two) times daily. 03/24/20   Delsa Grana, PA-C    Allergies Patient has no known allergies.  Family History  Problem Relation Age of Onset   Heart disease Mother    Diabetes Mother    Hyperlipidemia Mother    Hypertension Mother    Heart attack Mother    Lung cancer Father    Hypertension Father    Stroke Father    AAA (abdominal aortic aneurysm) Maternal Grandmother    Heart attack Maternal Grandmother    Diabetes Maternal Grandfather     Social History Social History   Tobacco Use   Smoking status: Some Days    Packs/day: 0.25    Years: 37.00    Pack years: 9.25    Types: Cigarettes    Start date: 10/19/1989   Smokeless tobacco: Never  Vaping Use   Vaping Use: Never used  Substance Use Topics   Alcohol use: No   Drug use: No    Review of Systems   Review of Systems  Constitutional:  Negative for chills and fever.  HENT:  Negative for congestion.   Eyes:  Negative for visual disturbance.   Respiratory:  Positive for cough. Negative for shortness of breath.   Gastrointestinal:  Positive for nausea and vomiting. Negative for abdominal pain and diarrhea.  Endocrine: Positive for polyuria.  Neurological:  Positive for headaches. Negative for weakness and numbness.  All other systems reviewed and are negative.  Physical Exam Updated Vital Signs BP (!) 169/96   Pulse 73   Temp 97.9 F (36.6 C) (Oral)   Resp 18   Ht 5\' 7"  (1.702 m)   Wt 113.4 kg   SpO2 97%   BMI 39.16 kg/m   Physical Exam Vitals and nursing note reviewed.  Constitutional:      General: He is not in acute distress.    Appearance: Normal appearance.  HENT:  Head: Normocephalic and atraumatic.  Eyes:     General: No scleral icterus.    Conjunctiva/sclera: Conjunctivae normal.  Pulmonary:     Effort: Pulmonary effort is normal. No respiratory distress.     Breath sounds: Normal breath sounds. No wheezing.  Musculoskeletal:        General: No deformity or signs of injury.     Cervical back: Normal range of motion.  Skin:    Coloration: Skin is not jaundiced or pale.  Neurological:     General: No focal deficit present.     Mental Status: He is alert and oriented to person, place, and time. Mental status is at baseline.     Comments: Pupils equal round reactive to light and accommodation, extraocular movements intact, face is symmetric, normal tongue movement 5 out of 5 strength in bilateral upper and lower extremities Sensation grossly intact in the bilateral upper and lower extremities Finger-nose-finger intact bilaterally  Psychiatric:        Mood and Affect: Mood normal.        Behavior: Behavior normal.     LABS (all labs ordered are listed, but only abnormal results are displayed)  Labs Reviewed  BASIC METABOLIC PANEL - Abnormal; Notable for the following components:      Result Value   Glucose, Bld 195 (*)    Calcium 8.7 (*)    All other components within normal limits   URINALYSIS, COMPLETE (UACMP) WITH MICROSCOPIC - Abnormal; Notable for the following components:   Specific Gravity, Urine >1.030 (*)    Glucose, UA 100 (*)    Hgb urine dipstick TRACE (*)    Protein, ur >300 (*)    Bacteria, UA RARE (*)    All other components within normal limits  CBG MONITORING, ED - Abnormal; Notable for the following components:   Glucose-Capillary 203 (*)    All other components within normal limits  CBC  HEPATIC FUNCTION PANEL  CBG MONITORING, ED   ____________________________________________  EKG  N/a ____________________________________________  RADIOLOGY Almeta Monas, personally viewed and evaluated these images (plain radiographs) as part of my medical decision making, as well as reviewing the written report by the radiologist.  ED MD interpretation: I reviewed the CT scan of the head which does not show any acute intracranial process    ____________________________________________   PROCEDURES  Procedure(s) performed (including Critical Care):  Procedures   ____________________________________________   INITIAL IMPRESSION / ASSESSMENT AND PLAN / ED COURSE     55 year old male with history of diabetes who presents with headache, nausea vomiting and hyperglycemia.  Patient's vital signs are notable for hypertension otherwise within normal limits.  He is very well-appearing.  His abdomen is benign he has no abdominal pain.  Does complain of a mild headache however it is not severe and his neurologic exam is normal.  There was some documentation in triage that he had a cerebral aneurysm however patient denied this on my evaluation.  My suspicion for subarachnoid hemorrhage is exceedingly low given his normal neurologic exam and how well he appears.  Suspect viral syndrome causing his headache, lightheadedness and vomiting.  His labs are reassuring.  He is not in DKA.  He was given a liter of fluids, Toradol and Reglan and his headache  significantly improved.  Patient stable for discharge.      ____________________________________________   FINAL CLINICAL IMPRESSION(S) / ED DIAGNOSES  Final diagnoses:  Nonintractable headache, unspecified chronicity pattern, unspecified headache type  Hyperglycemia  ED Discharge Orders     None        Note:  This document was prepared using Dragon voice recognition software and may include unintentional dictation errors.    Rada Hay, MD 07/03/21 Lurena Nida

## 2021-07-03 NOTE — ED Triage Notes (Signed)
Pt to ED for hyperglycemia, increased thirst and urination, emesis for the past few days.

## 2021-07-03 NOTE — Discharge Instructions (Signed)
Your blood work was reassuring today.  The CAT scan of your brain did not show any.  You likely have a viral syndrome that is causing your vomiting and your headache.  Please follow-up with your primary care provider if you have ongoing symptoms.

## 2021-07-03 NOTE — ED Provider Notes (Signed)
Emergency Medicine Provider Triage Evaluation Note  Cory Rivas , a 56 y.o. male  was evaluated in triage.  Pt complains of vomiting, headache, dizziness.  Has history of a aneurysm in his head.  Headache worsens vomiting.  Review of Systems  Positive: Headache, vomiting, dizziness Negative: No chest pain or shortness of breath  Physical Exam  BP (!) 167/90   Pulse 72   Temp 97.9 F (36.6 C) (Oral)   Resp 20   Ht 5\' 7"  (1.702 m)   Wt 113.4 kg   SpO2 96%   BMI 39.16 kg/m  Gen:   Awake, no distress   Resp:  Normal effort  MSK:   Moves extremities without difficulty  Other:  Appears stable  Medical Decision Making  Medically screening exam initiated at 2:51 PM.  Appropriate orders placed.  Cory Rivas was informed that the remainder of the evaluation will be completed by another provider, this initial triage assessment does not replace that evaluation, and the importance of remaining in the ED until their evaluation is complete.  Patient's 55 year old male presents with headache and dizziness.  Due to his aneurysm we will do a CT of the head.  Glucose is 195 so do not feel he is in DKA and is stable enough to wait in the lobby.  Patient was instructed to wait for evaluation   Cory Starks, PA-C 07/03/21 1452    Cory Drafts, MD 07/03/21 1504

## 2021-07-04 ENCOUNTER — Encounter: Payer: Medicaid Other | Admitting: Physical Therapy

## 2021-07-06 ENCOUNTER — Encounter: Payer: Medicaid Other | Admitting: Physical Therapy

## 2021-07-11 ENCOUNTER — Encounter: Payer: Medicaid Other | Admitting: Physical Therapy

## 2021-07-13 ENCOUNTER — Encounter: Payer: Medicaid Other | Admitting: Physical Therapy

## 2021-07-13 ENCOUNTER — Other Ambulatory Visit: Payer: Self-pay | Admitting: Family Medicine

## 2021-07-13 DIAGNOSIS — M5136 Other intervertebral disc degeneration, lumbar region: Secondary | ICD-10-CM

## 2021-07-13 DIAGNOSIS — M1A09X0 Idiopathic chronic gout, multiple sites, without tophus (tophi): Secondary | ICD-10-CM

## 2021-07-13 DIAGNOSIS — G8929 Other chronic pain: Secondary | ICD-10-CM

## 2021-07-13 DIAGNOSIS — M51369 Other intervertebral disc degeneration, lumbar region without mention of lumbar back pain or lower extremity pain: Secondary | ICD-10-CM

## 2021-07-13 DIAGNOSIS — M549 Dorsalgia, unspecified: Secondary | ICD-10-CM

## 2021-07-13 DIAGNOSIS — M5412 Radiculopathy, cervical region: Secondary | ICD-10-CM

## 2021-07-28 ENCOUNTER — Ambulatory Visit: Payer: Medicaid Other | Admitting: Family Medicine

## 2021-08-18 ENCOUNTER — Encounter: Payer: Self-pay | Admitting: Family Medicine

## 2021-08-18 ENCOUNTER — Ambulatory Visit: Payer: Medicaid Other | Admitting: Family Medicine

## 2021-08-18 ENCOUNTER — Other Ambulatory Visit: Payer: Self-pay

## 2021-08-18 VITALS — BP 136/82 | HR 88 | Temp 98.0°F | Resp 16 | Ht 67.0 in | Wt 241.9 lb

## 2021-08-18 DIAGNOSIS — M109 Gout, unspecified: Secondary | ICD-10-CM | POA: Diagnosis not present

## 2021-08-18 DIAGNOSIS — M5412 Radiculopathy, cervical region: Secondary | ICD-10-CM

## 2021-08-18 DIAGNOSIS — Z6841 Body Mass Index (BMI) 40.0 and over, adult: Secondary | ICD-10-CM | POA: Diagnosis not present

## 2021-08-18 DIAGNOSIS — R0683 Snoring: Secondary | ICD-10-CM | POA: Diagnosis not present

## 2021-08-18 DIAGNOSIS — Z122 Encounter for screening for malignant neoplasm of respiratory organs: Secondary | ICD-10-CM

## 2021-08-18 DIAGNOSIS — M549 Dorsalgia, unspecified: Secondary | ICD-10-CM

## 2021-08-18 DIAGNOSIS — Z23 Encounter for immunization: Secondary | ICD-10-CM

## 2021-08-18 DIAGNOSIS — F17200 Nicotine dependence, unspecified, uncomplicated: Secondary | ICD-10-CM

## 2021-08-18 DIAGNOSIS — I10 Essential (primary) hypertension: Secondary | ICD-10-CM

## 2021-08-18 DIAGNOSIS — Z87891 Personal history of nicotine dependence: Secondary | ICD-10-CM

## 2021-08-18 DIAGNOSIS — M5136 Other intervertebral disc degeneration, lumbar region: Secondary | ICD-10-CM

## 2021-08-18 DIAGNOSIS — E785 Hyperlipidemia, unspecified: Secondary | ICD-10-CM | POA: Diagnosis not present

## 2021-08-18 DIAGNOSIS — E66813 Obesity, class 3: Secondary | ICD-10-CM

## 2021-08-18 DIAGNOSIS — G959 Disease of spinal cord, unspecified: Secondary | ICD-10-CM

## 2021-08-18 DIAGNOSIS — E1169 Type 2 diabetes mellitus with other specified complication: Secondary | ICD-10-CM | POA: Diagnosis not present

## 2021-08-18 DIAGNOSIS — R29818 Other symptoms and signs involving the nervous system: Secondary | ICD-10-CM

## 2021-08-18 DIAGNOSIS — Z5181 Encounter for therapeutic drug level monitoring: Secondary | ICD-10-CM | POA: Diagnosis not present

## 2021-08-18 DIAGNOSIS — Z981 Arthrodesis status: Secondary | ICD-10-CM

## 2021-08-18 DIAGNOSIS — G8929 Other chronic pain: Secondary | ICD-10-CM

## 2021-08-18 MED ORDER — ZOSTER VAC RECOMB ADJUVANTED 50 MCG/0.5ML IM SUSR: 0.5000 mL | Freq: Once | INTRAMUSCULAR | 1 refills | Status: AC

## 2021-08-18 MED ORDER — DULOXETINE HCL 30 MG PO CPEP: 30.0000 mg | ORAL_CAPSULE | Freq: Two times a day (BID) | ORAL | 1 refills | Status: DC

## 2021-08-18 NOTE — Progress Notes (Signed)
Name: Cory Rivas   MRN: [id number]    DOB: September 30, 1966   Date:08/18/2021       Progress Note  Chief Complaint  Patient presents with   Follow-up   Hyperlipidemia   Hypertension   Diabetes    See's endo     Subjective:   Cory Rivas is a 55 y.o. male, presents to clinic for routine f/up  Hypertension:  Currently managed on lisinopril 40 mg Pt reports good med compliance and denies any SE.   Blood pressure today is fairly well controlled. BP Readings from Last 3 Encounters:  08/18/21 136/82  07/03/21 (!) 169/96  01/26/21 128/72   Pt denies CP, SOB, exertional sx, LE edema, palpitation, Ha's, visual disturbances, lightheadedness, hypotension, syncope.   Hyperlipidemia: Currently treated with crestor 40 mg, pt reports good med compliance Last Lipids: Lab Results  Component Value Date   CHOL 191 09/30/2020   HDL 43 09/30/2020   LDLCALC 113 (H) 09/30/2020   TRIG 228 (H) 09/30/2020   CHOLHDL 4.4 09/30/2020   - Denies: Chest pain, shortness of breath, myalgias, claudication  DM:   Per [facility] - on insulin, trulicity, metformin Reports good med compliance Blood sugars fasting 115-120's Denies: Polyuria, polydipsia, vision changes, neuropathy, hypoglycemia Recent pertinent labs: Lab Results  Component Value Date   HGBA1C 11.0 06/29/2021   HGBA1C 11.2 (H) 09/30/2020   HGBA1C 7.7 (H) 07/01/2020   Lab Results  Component Value Date   MICROALBUR 163.6 07/01/2020   LDLCALC 113 (H) 09/30/2020   CREATININE 0.95 07/03/2021   Standard of care and health maintenance: Urine Microalbumin:   Foot exam:  05/25/2021 podiatry OV DM eye exam:  due JAn ACEI/ARB:  yes Statin:  yes  Morbid Obesity:  weight down a little  Wt Readings from Last 5 Encounters:  08/18/21 241 lb 14.4 oz (109.7 kg)  07/03/21 250 lb (113.4 kg)  01/26/21 238 lb 12.8 oz (108.3 kg)  11/10/20 235 lb 8 oz (106.8 kg)  10/03/20 251 lb 4 oz (114 kg)   BMI Readings from Last 5 Encounters:   08/18/21 37.89 kg/m  07/03/21 39.16 kg/m  01/26/21 37.40 kg/m  11/10/20 36.88 kg/m  10/03/20 39.35 kg/m    Chronic pain s/p cervical fusion, cervical myelopathy - mobic, cymbalta, lyrica, seeing specialists and continuing PT  Restarted smoking -  Smoking cessation instruction/counseling given:  counseled patient on the dangers of tobacco use, advised patient to stop smoking, and reviewed strategies to maximize success Spent more than 5 min with counseling today - discussing smoking hx, cessation today, risk of smoking     Current Outpatient Medications:    acetaminophen (TYLENOL) 325 MG tablet, SMARTSIG:3 Tablet(s) By Mouth Every 8 Hours PRN, Disp: , Rfl:    allopurinol (ZYLOPRIM) 300 MG tablet, TAKE 1 TABLET BY MOUTH ONCE A DAY, Disp: 90 tablet, Rfl: 0   amLODipine (NORVASC) 10 MG tablet, Take 1 tablet (10 mg total) by mouth daily., Disp: 90 tablet, Rfl: 3   Continuous Blood Gluc Receiver (FREESTYLE LIBRE 2 READER) DEVI, , Disp: , Rfl:    Continuous Blood Gluc Sensor (FREESTYLE LIBRE 2 SENSOR) MISC, , Disp: , Rfl:    Dulaglutide 1.5 MG/0.5ML SOPN, Inject into the skin., Disp: , Rfl:    DULoxetine (CYMBALTA) 30 MG capsule, TAKE 1 CAPSULE BY MOUTH TWICE DAILY, Disp: 180 capsule, Rfl: 1   indomethacin (INDOCIN) 25 MG capsule, Take 50 mg PO TID x 2 d at onset of gout flare, then take 25 mg  po TID x 3 d, Disp: 20 capsule, Rfl: 3   insulin glargine (LANTUS SOLOSTAR) 100 UNIT/ML Solostar Pen, Inject into the skin., Disp: , Rfl:    insulin lispro (HUMALOG) 100 UNIT/ML KwikPen, By correction scale based on pre-meal blood sugar. 2 units/ 50 over 150. TDD up to 45 units, Disp: , Rfl:    lisinopril (ZESTRIL) 40 MG tablet, Take 1 tablet (40 mg total) by mouth daily., Disp: 90 tablet, Rfl: 3   meloxicam (MOBIC) 15 MG tablet, Take 1 tablet (15 mg total) by mouth daily., Disp: 30 tablet, Rfl: 0   metFORMIN (GLUCOPHAGE) 1000 MG tablet, TAKE 1 TABLET BY MOUTH TWICE A DAY WITH A MEAL, Disp: 180  tablet, Rfl: 3   pregabalin (LYRICA) 25 MG capsule, TAKE 1 CAPSULE BY MOUTH IN THE MORNING AND 1 TO 3 CAPSULES EVERY NIGHT FOR CERVICAL RADICULOPATHY AND NEUROPATHY, Disp: 270 capsule, Rfl: 2   rosuvastatin (CRESTOR) 40 MG tablet, Take 1 tablet (40 mg total) by mouth daily., Disp: 90 tablet, Rfl: 3   triamcinolone cream (KENALOG) 0.1 %, Apply 1 application topically 2 (two) times daily., Disp: 30 g, Rfl: 0  Patient Active Problem List   Diagnosis Date Noted   S/P cervical spinal fusion 08/18/2021   Former heavy tobacco smoker 08/18/2021   Gout 08/18/2021   Other chronic pain 26/37/8588   Systolic murmur 50/27/7412   Tobacco use 10/26/2020   Polyp of ascending colon    Rectal polyp    Class 2 severe obesity with serious comorbidity and body mass index (BMI) of 39.0 to 39.9 in adult [facility]) 04/15/2019   Chronic kidney disease (CKD) stage G2/A3, mildly decreased glomerular filtration rate (GFR) between 60-89 mL/min/1.73 square meter and albuminuria creatinine ratio greater than 300 mg/g 04/15/2019   Proteinuria 04/15/2019   Hyperlipidemia associated with type 2 diabetes mellitus ([place]) 03/18/2019   Diabetes mellitus ([place]) 03/18/2019   Idiopathic chronic gout of multiple sites without tophus 03/18/2019   Essential hypertension 03/18/2019   Dermatitis 03/18/2019   Lumbar degenerative disc disease 11/22/2015   Cervical myelopathy ([place]) 11/22/2015   Osteoarthritis of spine with radiculopathy, lumbar region 11/22/2015    Past Surgical History:  Procedure Laterality Date   CHOLECYSTECTOMY     COLONOSCOPY WITH PROPOFOL N/A 03/10/2020   Procedure: COLONOSCOPY WITH PROPOFOL;  Surgeon: Lucilla Lame, MD;  Location[facility] ENDOSCOPY;  Service: Endoscopy;  Laterality: N/A;    Family History  Problem Relation Age of Onset   Heart disease Mother    Diabetes Mother    Hyperlipidemia Mother    Hypertension Mother    Heart attack Mother    Lung cancer Father    Hypertension Father    Stroke Father     AAA (abdominal aortic aneurysm) Maternal Grandmother    Heart attack Maternal Grandmother    Diabetes Maternal Grandfather     Social History   Tobacco Use   Smoking status: Some Days    Packs/day: 0.25    Years: 37.00    Pack years: 9.25    Types: Cigarettes    Start date: 10/19/1989   Smokeless tobacco: Never  Vaping Use   Vaping Use: Never used  Substance Use Topics   Alcohol use: No   Drug use: No     No Known Allergies  Health Maintenance  Topic Date Due   Pneumococcal Vaccine 1-15 Years old (2 - PCV) 04/14/2020   INFLUENZA VACCINE  05/15/2021   FOOT EXAM  07/01/2021   COVID-19 Vaccine (4 -  Booster for [employer] series) 08/28/2021 (Originally 09/07/2020)   Zoster Vaccines- Shingrix (1 of 2) 11/18/2021 (Originally 02/12/2017)   OPHTHALMOLOGY EXAM  10/28/2021   HEMOGLOBIN A1C  12/27/2021   COLONOSCOPY (Pts 45-31yrs Insurance coverage will need to be confirmed)  03/11/2023   TETANUS/TDAP  04/14/2029   Hepatitis C Screening  Completed   HIV Screening  Completed   HPV VACCINES  Aged Out    Chart Review Today: I personally reviewed active problem list, medication list, allergies, family history, social history, health maintenance, notes from last encounter, lab results, imaging with the patient/caregiver today.   Review of Systems  Constitutional: Negative.   HENT: Negative.    Eyes: Negative.   Respiratory: Negative.    Cardiovascular: Negative.   Gastrointestinal: Negative.   Endocrine: Negative.   Genitourinary: Negative.   Musculoskeletal: Negative.   Skin: Negative.   Allergic/Immunologic: Negative.   Neurological: Negative.   Hematological: Negative.   Psychiatric/Behavioral: Negative.    All other systems reviewed and are negative.   Objective:   Vitals:   08/18/21 1356  BP: 136/82  Pulse: 88  Resp: 16  Temp: 98 F (36.7 C)  SpO2: 97%  Weight: 241 lb 14.4 oz (109.7 kg)  Height: 5\' 7"  (1.702 m)    Body mass index is 37.89 kg/m.  Physical  Exam Vitals and nursing note reviewed.  Constitutional:      General: He is not in acute distress.    Appearance: Normal appearance. He is well-developed and well-groomed. He is morbidly obese. He is not ill-appearing, toxic-appearing or diaphoretic.     Interventions: Face mask in place.  HENT:     Head: Normocephalic and atraumatic.     Jaw: No trismus.     Right Ear: External ear normal.     Left Ear: External ear normal.  Eyes:     General: Lids are normal. No scleral icterus.       Right eye: No discharge.        Left eye: No discharge.     Conjunctiva/sclera: Conjunctivae normal.  Neck:     Trachea: Trachea and phonation normal. No tracheal deviation.  Cardiovascular:     Rate and Rhythm: Normal rate and regular rhythm.     Pulses: Normal pulses.          Radial pulses are 2+ on the right side and 2+ on the left side.       Posterior tibial pulses are 2+ on the right side and 2+ on the left side.     Heart sounds: Normal heart sounds. No murmur heard.   No friction rub. No gallop.  Pulmonary:     Effort: Pulmonary effort is normal. No respiratory distress.     Breath sounds: Normal breath sounds. No stridor. No wheezing, rhonchi or rales.  Abdominal:     General: Bowel sounds are normal. There is no distension.     Palpations: Abdomen is soft.  Musculoskeletal:     Cervical back: Decreased range of motion.     Right lower leg: No edema.     Left lower leg: No edema.  Skin:    General: Skin is warm and dry.     Coloration: Skin is not jaundiced.     Findings: No rash.     Nails: There is no clubbing.  Neurological:     Mental Status: He is alert. Mental status is at baseline.     Cranial Nerves: No dysarthria or facial asymmetry.  Motor: No tremor or abnormal muscle tone.     Gait: Gait normal.  Psychiatric:        Mood and Affect: Mood normal.        Speech: Speech normal.        Behavior: Behavior normal. Behavior is cooperative.     Diabetic Foot Exam -  Simple   No data filed       Assessment & Plan:     ICD-10-CM   1. Essential hypertension  W11 COMPLETE METABOLIC PANEL WITH GFR   has been uncontrolled but near goal today on lisinopril 40 mg, consider adding HCTZ or norvasc if >140    2. Hyperlipidemia associated with type 2 diabetes mellitus (HCC)  B14.78 COMPLETE METABOLIC PANEL WITH GFR   E78.5 Lipid panel   good statin compliance, no SE or concerns    3. Type 2 diabetes mellitus with other specified complication, without long-term current use of insulin (HCC)  G95.62 COMPLETE METABOLIC PANEL WITH GFR    Lipid panel   uncontrolled, high risk pt, now est with [facility] - he reports fasting CBG 110-120 - last A1C 11    4. Class 3 severe obesity due to excess calories with serious comorbidity and body mass index (BMI) of 40.0 to 44.9 in adult (HCC)  Z30.86 COMPLETE METABOLIC PANEL WITH GFR   Z68.41 Lipid panel    5. Cervical myelopathy (HCC)  G95.9 DULoxetine (CYMBALTA) 30 MG capsule   trying to continue PT, on chronic pain meds    6. S/P cervical spinal fusion  Z98.1    per specialist    7. Chronic back pain, unspecified back location, unspecified back pain laterality  M54.9 DULoxetine (CYMBALTA) 30 MG capsule   G89.29    per specialists    8. Need for pneumococcal vaccination  Z23 Pneumococcal conjugate vaccine 20-valent (Prevnar 20)   due for prevnar 20     9. Need for influenza vaccination  Z23 Flu Vaccine QUAD 6+ mos PF IM (Fluarix Quad PF)   offered today    10. Need for shingles vaccine  Z23 Zoster Vaccine Adjuvanted [facility]) injection   sent to pharmacy    11. Encounter for medication monitoring  V78.46 COMPLETE METABOLIC PANEL WITH GFR    Lipid panel    Uric acid    12. Gout, unspecified cause, unspecified chronicity, unspecified site  N62.9 COMPLETE METABOLIC PANEL WITH GFR    Uric acid   no recent flare, recheck uric acid, not on allopurinol    13. Encounter for screening for malignant  neoplasm of lung in current smoker with 30 pack year history or greater  Z12.2 Ambulatory Referral Lung Cancer Screening [facility]   F17.200    resumed smoking, 1/2ppd with >30 pack year hx, overall chronic sx are still better than when he was smoking 2 ppd    14. Personal history of tobacco use, presenting hazards to health  Z87.891 Ambulatory Referral Lung Cancer Screening [facility]    15. Snoring  R06.83 Ambulatory referral to Pulmonology   new noted snoring and pausing breathing - he requests referral for sleep medicine - high likelyhood that pt has OSA with weight & body habitus    16. Suspected sleep apnea  R29.818 Ambulatory referral to Pulmonology   same as above       Return in about 3 months (around 11/18/2021) for CPE.   Delsa Grana, PA-C 08/18/21 2:27 PM

## 2021-08-21 ENCOUNTER — Telehealth: Payer: Self-pay | Admitting: Family Medicine

## 2021-08-21 ENCOUNTER — Other Ambulatory Visit: Payer: Self-pay

## 2021-08-21 ENCOUNTER — Telehealth: Payer: Self-pay

## 2021-08-21 DIAGNOSIS — Z5181 Encounter for therapeutic drug level monitoring: Secondary | ICD-10-CM

## 2021-08-21 DIAGNOSIS — E1169 Type 2 diabetes mellitus with other specified complication: Secondary | ICD-10-CM

## 2021-08-21 NOTE — Telephone Encounter (Signed)
Pt is calling for lab results. Please advise CB- [phone]

## 2021-08-21 NOTE — Telephone Encounter (Signed)
Called back pt no answer, left vm to call back.

## 2021-08-21 NOTE — Telephone Encounter (Signed)
Patient returned our call for lab results.  Shared note from Delsa Grana with Pt.   Pt would like to start medication to prevent gout flares.

## 2021-08-22 LAB — COMPLETE METABOLIC PANEL WITH GFR
AG Ratio: 1.3 (calc) (ref 1.0–2.5)
ALT: 16 U/L (ref 9–46)
AST: 15 U/L (ref 10–35)
Albumin: 3.9 g/dL (ref 3.6–5.1)
Alkaline phosphatase (APISO): 73 U/L (ref 35–144)
BUN: 13 mg/dL (ref 7–25)
CO2: 30 mmol/L (ref 20–32)
Calcium: 9.2 mg/dL (ref 8.6–10.3)
Chloride: 100 mmol/L (ref 98–110)
Creat: 1.2 mg/dL (ref 0.70–1.30)
Globulin: 2.9 g/dL (calc) (ref 1.9–3.7)
Glucose, Bld: 165 mg/dL — ABNORMAL HIGH (ref 65–99)
Potassium: 4.4 mmol/L (ref 3.5–5.3)
Sodium: 139 mmol/L (ref 135–146)
Total Bilirubin: 0.4 mg/dL (ref 0.2–1.2)
Total Protein: 6.8 g/dL (ref 6.1–8.1)
eGFR: 72 mL/min/{1.73_m2} (ref >=60)

## 2021-08-22 LAB — TEST AUTHORIZATION

## 2021-08-22 LAB — LIPID PANEL
Cholesterol: 220 mg/dL — ABNORMAL HIGH (ref <200)
HDL: 36 mg/dL — ABNORMAL LOW (ref >=40)
Non-HDL Cholesterol (Calc): 184 mg/dL (calc) — ABNORMAL HIGH (ref <130)
Total CHOL/HDL Ratio: 6.1 (calc) — ABNORMAL HIGH (ref <5.0)
Triglycerides: 404 mg/dL — ABNORMAL HIGH (ref <150)

## 2021-08-22 LAB — URIC ACID: Uric Acid, Serum: 7.3 mg/dL (ref 4.0–8.0)

## 2021-08-22 LAB — LDL CHOLESTEROL, DIRECT: Direct LDL: 135 mg/dL — ABNORMAL HIGH (ref <100)

## 2021-08-28 ENCOUNTER — Other Ambulatory Visit: Payer: Self-pay | Admitting: Family Medicine

## 2021-08-28 DIAGNOSIS — M1A09X0 Idiopathic chronic gout, multiple sites, without tophus (tophi): Secondary | ICD-10-CM

## 2021-08-31 ENCOUNTER — Encounter: Payer: Self-pay | Admitting: Podiatry

## 2021-08-31 ENCOUNTER — Other Ambulatory Visit: Payer: Self-pay

## 2021-08-31 ENCOUNTER — Ambulatory Visit: Payer: Medicaid Other | Admitting: Podiatry

## 2021-08-31 DIAGNOSIS — M79675 Pain in left toe(s): Secondary | ICD-10-CM | POA: Diagnosis not present

## 2021-08-31 DIAGNOSIS — M79674 Pain in right toe(s): Secondary | ICD-10-CM

## 2021-08-31 DIAGNOSIS — E0843 Diabetes mellitus due to underlying condition with diabetic autonomic (poly)neuropathy: Secondary | ICD-10-CM

## 2021-08-31 DIAGNOSIS — B351 Tinea unguium: Secondary | ICD-10-CM | POA: Diagnosis not present

## 2021-08-31 DIAGNOSIS — N182 Chronic kidney disease, stage 2 (mild): Secondary | ICD-10-CM

## 2021-08-31 NOTE — Progress Notes (Signed)
This patient returns to my office for at risk foot care.  This patient requires this care by a professional since this patient will be at risk due to having diabetes and CKD.  This patient is unable to cut nails himself since the patient cannot reach his nails.These nails are painful walking and wearing shoes.  This patient presents for at risk foot care today.  General Appearance  Alert, conversant and in no acute stress.  Vascular  Dorsalis pedis and posterior tibial  pulses are palpable  bilaterally.  Capillary return is within normal limits  bilaterally. Temperature is within normal limits  bilaterally.  Neurologic  Senn-Weinstein monofilament wire test within normal limits  bilaterally. Muscle power within normal limits bilaterally.  Nails Thick disfigured discolored nails with subungual debris  from hallux to fifth toes bilaterally. No evidence of bacterial infection or drainage bilaterally.  Orthopedic  No limitations of motion  feet .  No crepitus or effusions noted.  No bony pathology or digital deformities noted.  Skin  normotropic skin with no porokeratosis noted bilaterally.  No signs of infections or ulcers noted.     Onychomycosis  Pain in right toes  Pain in left toes  Consent was obtained for treatment procedures.   Mechanical debridement of nails 1-5  bilaterally performed with a nail nipper.  Filed with dremel without incident.    Return office visit   3 months                   Told patient to return for periodic foot care and evaluation due to potential at risk complications.   Brittie Whisnant DPM   

## 2021-09-04 ENCOUNTER — Other Ambulatory Visit: Payer: Self-pay | Admitting: Family Medicine

## 2021-09-04 DIAGNOSIS — M1A09X0 Idiopathic chronic gout, multiple sites, without tophus (tophi): Secondary | ICD-10-CM

## 2021-09-04 MED ORDER — ALLOPURINOL 100 MG PO TABS: 100.0000 mg | ORAL_TABLET | Freq: Every day | ORAL | 3 refills | Status: DC

## 2021-09-04 MED ORDER — ALLOPURINOL 300 MG PO TABS: ORAL_TABLET | ORAL | 3 refills | Status: DC

## 2021-09-05 ENCOUNTER — Other Ambulatory Visit: Payer: Self-pay | Admitting: Family Medicine

## 2021-09-05 DIAGNOSIS — M1A09X0 Idiopathic chronic gout, multiple sites, without tophus (tophi): Secondary | ICD-10-CM

## 2021-09-05 NOTE — Telephone Encounter (Signed)
Pt called back to report that he needs this soon

## 2021-09-28 DIAGNOSIS — Z5181 Encounter for therapeutic drug level monitoring: Secondary | ICD-10-CM | POA: Diagnosis not present

## 2021-09-28 DIAGNOSIS — E1165 Type 2 diabetes mellitus with hyperglycemia: Secondary | ICD-10-CM | POA: Diagnosis not present

## 2021-09-28 DIAGNOSIS — Z6838 Body mass index (BMI) 38.0-38.9, adult: Secondary | ICD-10-CM | POA: Diagnosis not present

## 2021-09-28 DIAGNOSIS — Z794 Long term (current) use of insulin: Secondary | ICD-10-CM | POA: Diagnosis not present

## 2021-09-28 DIAGNOSIS — Z8679 Personal history of other diseases of the circulatory system: Secondary | ICD-10-CM | POA: Diagnosis not present

## 2021-11-20 ENCOUNTER — Encounter: Payer: Medicaid Other | Admitting: Physician Assistant

## 2021-11-30 ENCOUNTER — Encounter: Payer: Self-pay | Admitting: Podiatry

## 2021-11-30 ENCOUNTER — Ambulatory Visit: Payer: Medicaid Other | Admitting: Podiatry

## 2021-11-30 ENCOUNTER — Other Ambulatory Visit: Payer: Self-pay

## 2021-11-30 ENCOUNTER — Encounter: Payer: Self-pay | Admitting: Physician Assistant

## 2021-11-30 DIAGNOSIS — M79674 Pain in right toe(s): Secondary | ICD-10-CM

## 2021-11-30 DIAGNOSIS — E0843 Diabetes mellitus due to underlying condition with diabetic autonomic (poly)neuropathy: Secondary | ICD-10-CM

## 2021-11-30 DIAGNOSIS — M79675 Pain in left toe(s): Secondary | ICD-10-CM

## 2021-11-30 DIAGNOSIS — N182 Chronic kidney disease, stage 2 (mild): Secondary | ICD-10-CM

## 2021-11-30 DIAGNOSIS — B351 Tinea unguium: Secondary | ICD-10-CM | POA: Diagnosis not present

## 2021-11-30 NOTE — Progress Notes (Signed)
This patient returns to my office for at risk foot care.  This patient requires this care by a professional since this patient will be at risk due to having diabetes and CKD.  This patient is unable to cut nails himself since the patient cannot reach his nails.These nails are painful walking and wearing shoes.  This patient presents for at risk foot care today.  General Appearance  Alert, conversant and in no acute stress.  Vascular  Dorsalis pedis and posterior tibial  pulses are palpable  bilaterally.  Capillary return is within normal limits  bilaterally. Temperature is within normal limits  bilaterally.  Neurologic  Senn-Weinstein monofilament wire test within normal limits  bilaterally. Muscle power within normal limits bilaterally.  Nails Thick disfigured discolored nails with subungual debris  from hallux to fifth toes bilaterally. No evidence of bacterial infection or drainage bilaterally.  Orthopedic  No limitations of motion  feet .  No crepitus or effusions noted.  No bony pathology or digital deformities noted.  Skin  normotropic skin with no porokeratosis noted bilaterally.  No signs of infections or ulcers noted.     Onychomycosis  Pain in right toes  Pain in left toes  Consent was obtained for treatment procedures.   Mechanical debridement of nails 1-5  bilaterally performed with a nail nipper.  Filed with dremel without incident.    Return office visit   3 months                   Told patient to return for periodic foot care and evaluation due to potential at risk complications.   Leahanna Buser DPM   

## 2021-12-04 ENCOUNTER — Encounter: Payer: Self-pay | Admitting: Physician Assistant

## 2021-12-04 ENCOUNTER — Ambulatory Visit: Payer: Medicaid Other | Admitting: Physician Assistant

## 2021-12-04 VITALS — BP 172/88 | HR 81 | Temp 98.3°F | Resp 16 | Ht 67.0 in | Wt 248.3 lb

## 2021-12-04 DIAGNOSIS — Z23 Encounter for immunization: Secondary | ICD-10-CM

## 2021-12-04 DIAGNOSIS — Z720 Tobacco use: Secondary | ICD-10-CM

## 2021-12-04 DIAGNOSIS — Z0000 Encounter for general adult medical examination without abnormal findings: Secondary | ICD-10-CM

## 2021-12-04 DIAGNOSIS — E1169 Type 2 diabetes mellitus with other specified complication: Secondary | ICD-10-CM | POA: Diagnosis not present

## 2021-12-04 DIAGNOSIS — I10 Essential (primary) hypertension: Secondary | ICD-10-CM

## 2021-12-04 DIAGNOSIS — E785 Hyperlipidemia, unspecified: Secondary | ICD-10-CM | POA: Diagnosis not present

## 2021-12-04 DIAGNOSIS — Z125 Encounter for screening for malignant neoplasm of prostate: Secondary | ICD-10-CM | POA: Diagnosis not present

## 2021-12-04 DIAGNOSIS — N182 Chronic kidney disease, stage 2 (mild): Secondary | ICD-10-CM | POA: Diagnosis not present

## 2021-12-04 DIAGNOSIS — H9201 Otalgia, right ear: Secondary | ICD-10-CM

## 2021-12-04 DIAGNOSIS — Z6839 Body mass index (BMI) 39.0-39.9, adult: Secondary | ICD-10-CM | POA: Diagnosis not present

## 2021-12-04 MED ORDER — SHINGRIX 50 MCG/0.5ML IM SUSR: 0.5000 mL | Freq: Once | INTRAMUSCULAR | 1 refills | Status: AC

## 2021-12-04 NOTE — Progress Notes (Signed)
Name: Cory Rivas   MRN: [id number]    DOB: 1966/04/05   Date:12/04/2021       Progress Note  Today's Provider: Jacquelin Hawking, MHS, PA-C Introduced myself to the patient as a PA-C and provided education on APPs in clinical practice.     Subjective  Chief Complaint  Chief Complaint  Patient presents with   Annual Exam    Pt has not taken BP pill yet.   Ear Pain    Right ear     HPI  Patient presents for annual CPE. Patient states his BP is elevated due to not taking his HTN medications today  States he ran out of medications except for Metformin - states he has called it in and just has to go pick up He states he is concerned for reduced restful sleep and would like to have sleep study performed  States he has been told that he snores but is not sure if he stops breathing while asleep. States he wakes up frequently throughout the night and is not well rested  States his right ear is hurting States he thinks he had swimmer's ear and place a cotton ball inside it while taking a shower States he was unable to find the cotton ball the next day - now ear is itching and mildly painful. States this has been going for about a week.   Discussed diabetic eye exam today. States he had one done in Sept/ Oct - will need to get records to update chart  IPSS Questionnaire (AUA-7): Over the past month   1)  How often have you had a sensation of not emptying your bladder completely after you finish urinating?  0 - Not at all  2)  How often have you had to urinate again less than two hours after you finished urinating? 1 - Less than 1 time in 5  3)  How often have you found you stopped and started again several times when you urinated?  0 - Not at all  4) How difficult have you found it to postpone urination?  1 - Less than 1 time in 5  5) How often have you had a weak urinary stream?  0 - Not at all  6) How often have you had to push or strain to begin urination?  0 - Not at all  7) How  many times did you most typically get up to urinate from the time you went to bed until the time you got up in the morning?  1 - 1 time  Total score:  0-7 mildly symptomatic   8-19 moderately symptomatic   20-35 severely symptomatic     Diet: States he is not following any particular diet at this time. Eats a lot of fruit, sandwiches. Admits to eating a lot of BBQ the last few weeks.  Exercise: Not really exercising at this time. States he might start doing more physical activity when it gets warmer.  Sleep:Reports frequent awakenings at night and is not well rested when he wakes up. Admits to snoring throughout the night Mood:"I've been good". Denies sadness, depressed mood, anxiety at this. Denies concerns for decreased concentration or memory.   Depression: phq 9 is negative Depression screen [facility] 2/9 12/04/2021 08/18/2021 01/26/2021 10/31/2020 09/30/2020  Decreased Interest 0 0 0 0 0  Down, Depressed, Hopeless 0 0 0 0 0  PHQ - 2 Score 0 0 0 0 0  Altered sleeping 0 0 - 3 -  Tired, decreased energy 0 0 - 2 -  Change in appetite 0 0 - 0 -  Feeling bad or failure about yourself  0 0 - 0 -  Trouble concentrating 0 0 - 0 -  Moving slowly or fidgety/restless 0 0 - 0 -  Suicidal thoughts 0 0 - 0 -  PHQ-9 Score 0 0 - 5 -  Difficult doing work/chores Not difficult at all Not difficult at all - Somewhat difficult -  Some recent data might be hidden    Hypertension:  BP Readings from Last 3 Encounters:  12/04/21 (!) 172/88  08/18/21 136/82  07/03/21 (!) 169/96    Obesity: Wt Readings from Last 3 Encounters:  12/04/21 248 lb 4.8 oz (112.6 kg)  08/18/21 241 lb 14.4 oz (109.7 kg)  07/03/21 250 lb (113.4 kg)   BMI Readings from Last 3 Encounters:  12/04/21 38.89 kg/m  08/18/21 37.89 kg/m  07/03/21 39.16 kg/m     Lipids:  Lab Results  Component Value Date   CHOL 220 (H) 08/18/2021   CHOL 191 09/30/2020   CHOL 225 (H) 07/01/2020   Lab Results  Component Value Date   HDL 36 (L)  08/18/2021   HDL 43 09/30/2020   HDL 41 07/01/2020   Lab Results  Component Value Date   LDLCALC  08/18/2021     Comment:     . LDL cholesterol not calculated. Triglyceride levels greater than 400 mg/dL invalidate calculated LDL results. . Reference range: <100 . Desirable range <100 mg/dL for primary prevention;   <70 mg/dL for patients with CHD or diabetic patients  with > or = 2 CHD risk factors. Marland Kitchen LDL-C is now calculated using the Martin-Hopkins  calculation, which is a validated novel method providing  better accuracy than the Friedewald equation in the  estimation of LDL-C.  Horald Pollen et al. Lenox Ahr. 4782;956(21): 2061-2068  (http://education.[facility].com/faq/FAQ164)    LDLCALC 113 (H) 09/30/2020   LDLCALC 158 (H) 07/01/2020   Lab Results  Component Value Date   TRIG 404 (H) 08/18/2021   TRIG 228 (H) 09/30/2020   TRIG 140 07/01/2020   Lab Results  Component Value Date   CHOLHDL 6.1 (H) 08/18/2021   CHOLHDL 4.4 09/30/2020   CHOLHDL 5.5 (H) 07/01/2020   Lab Results  Component Value Date   LDLDIRECT 135 (H) 08/18/2021   Glucose:  Glucose, Bld  Date Value Ref Range Status  08/18/2021 165 (H) 65 - 99 mg/dL Final    Comment:    .            Fasting reference interval . For someone without known diabetes, a glucose value >125 mg/dL indicates that they may have diabetes and this should be confirmed with a follow-up test. .   07/03/2021 195 (H) 70 - 99 mg/dL Final    Comment:    Glucose reference range applies only to samples taken after fasting for at least 8 hours.  01/26/2021 100 (H) 65 - 99 mg/dL Final    Comment:    .            Fasting reference interval . For someone without known diabetes, a glucose value between 100 and 125 mg/dL is consistent with prediabetes and should be confirmed with a follow-up test. .    Glucose-Capillary  Date Value Ref Range Status  07/03/2021 203 (H) 70 - 99 mg/dL Final    Comment:    Glucose reference  range applies only to samples taken after fasting for at  least 8 hours.  03/10/2020 145 (H) 70 - 99 mg/dL Final    Comment:    Glucose reference range applies only to samples taken after fasting for at least 8 hours.  12/02/2019 138 (H) 70 - 99 mg/dL Final    Flowsheet Row [facility]  AUDIT-C Score 0        Divorced STD testing and prevention (HIV/chl/gon/syphilis):  no Hep C Screening: completed Skin cancer: Discussed monitoring for atypical lesions Colorectal cancer: Colonoscopy due in 2024 Prostate cancer:  yes will order today Lab Results  Component Value Date   PSA 0.9 06/18/2019     Lung cancer:  Low Dose CT Chest recommended if Age 76-80 years, 30 pack-year currently smoking OR have quit w/in 15years. Patient  not applicable AAA: The USPSTF recommends one-time screening with ultrasonography in men ages 62 to 75 years who have ever smoked. Patient:  yes ECG:  NA  Vaccines:   HPV: NA Tdap: due in 2030  Shingrix: sent in to pharmacy Pneumonia: NA Flu: NA - already received for season COVID-19: plans to get at a later date.   Advanced Care Planning: A voluntary discussion about advance care planning including the explanation and discussion of advance directives.  Discussed health care proxy and Living will, and the patient was able to identify a health care proxy as his sister [name].     Patient Active Problem List   Diagnosis Date Noted   S/P cervical spinal fusion 08/18/2021   Former heavy tobacco smoker 08/18/2021   Gout 08/18/2021   Other chronic pain 10/26/2020   Systolic murmur 10/26/2020   Tobacco use 10/26/2020   Polyp of ascending colon    Rectal polyp    Class 2 severe obesity with serious comorbidity and body mass index (BMI) of 39.0 to 39.9 in adult [facility]) 04/15/2019   Chronic kidney disease (CKD) stage G2/A3, mildly decreased glomerular filtration rate (GFR) between 60-89 mL/min/1.73 square  meter and albuminuria creatinine ratio greater than 300 mg/g 04/15/2019   Proteinuria 04/15/2019   Hyperlipidemia associated with type 2 diabetes mellitus (HCC) 03/18/2019   Diabetes mellitus (HCC) 03/18/2019   Idiopathic chronic gout of multiple sites without tophus 03/18/2019   Essential hypertension 03/18/2019   Dermatitis 03/18/2019   Lumbar degenerative disc disease 11/22/2015   Cervical myelopathy (HCC) 11/22/2015   Osteoarthritis of spine with radiculopathy, lumbar region 11/22/2015    Past Surgical History:  Procedure Laterality Date   CHOLECYSTECTOMY     COLONOSCOPY WITH PROPOFOL N/A 03/10/2020   Procedure: COLONOSCOPY WITH PROPOFOL;  Surgeon: Midge Minium, MD;  Location: [facility] ENDOSCOPY;  Service: Endoscopy;  Laterality: N/A;    Family History  Problem Relation Age of Onset   Heart disease Mother    Diabetes Mother    Hyperlipidemia Mother    Hypertension Mother    Heart attack Mother    Lung cancer Father    Hypertension Father    Stroke Father    AAA (abdominal aortic aneurysm) Maternal Grandmother    Heart attack Maternal Grandmother    Diabetes Maternal Grandfather     Social History   Socioeconomic History   Marital status: Divorced    Spouse name: Not on file   Number of children: 0   Years of education: 9   Highest education level: 9th grade  Occupational History   Occupation: disability  Tobacco Use   Smoking status: Every Day    Packs/day: 1.00  Years: 37.00    Pack years: 37.00    Types: Cigarettes    Start date: 10/19/1989   Smokeless tobacco: Never   Tobacco comments:    30+ years hx smoking 2 ppd, stopped smoking briefly in April 2022 for a surgery and then restarted smoking about 1/2 ppd last 4-5 months   Vaping Use   Vaping Use: Never used  Substance and Sexual Activity   Alcohol use: No   Drug use: No   Sexual activity: Not Currently    Partners: Female  Other Topics Concern   Not on file  Social History Narrative   Not on file    Social Determinants of Health   Financial Resource Strain: Not on file  Food Insecurity: Not on file  Transportation Needs: Not on file  Physical Activity: Not on file  Stress: Not on file  Social Connections: Not on file  Intimate Partner Violence: Not on file     Current Outpatient Medications:    acetaminophen (TYLENOL) 325 MG tablet, SMARTSIG:3 Tablet(s) By Mouth Every 8 Hours PRN, Disp: , Rfl:    allopurinol (ZYLOPRIM) 100 MG tablet, Take 1 tablet (100 mg total) by mouth daily. Take with 300 mg tab (for total of 400 mg once daily), Disp: 90 tablet, Rfl: 3   allopurinol (ZYLOPRIM) 300 MG tablet, Take one 300 mg tab po daily WITH 100 mg tab for total of 400 mg once daily for gout, Disp: 90 tablet, Rfl: 3   amLODipine (NORVASC) 10 MG tablet, Take 1 tablet (10 mg total) by mouth daily., Disp: 90 tablet, Rfl: 3   Continuous Blood Gluc Receiver (FREESTYLE LIBRE 2 READER) DEVI, , Disp: , Rfl:    Continuous Blood Gluc Sensor (FREESTYLE LIBRE 2 SENSOR) MISC, , Disp: , Rfl:    Dulaglutide 1.5 MG/0.5ML SOPN, Inject into the skin., Disp: , Rfl:    DULoxetine (CYMBALTA) 30 MG capsule, Take 1 capsule (30 mg total) by mouth 2 (two) times daily., Disp: 180 capsule, Rfl: 1   indomethacin (INDOCIN) 25 MG capsule, TAKE 2 TABLETS (50 MG) BY 3 TIMES DAILY FOR 2 DAYS AT ONSET OF GOUT FLARE UP, THEN TAKE 1(25MG )TABLET BY MOUTH FOR 3 DAYS, Disp: 20 capsule, Rfl: 3   insulin lispro (HUMALOG) 100 UNIT/ML KwikPen, By correction scale based on pre-meal blood sugar. 2 units/ 50 over 150. TDD up to 45 units, Disp: , Rfl:    lisinopril (ZESTRIL) 40 MG tablet, Take 1 tablet (40 mg total) by mouth daily., Disp: 90 tablet, Rfl: 3   metFORMIN (GLUCOPHAGE) 1000 MG tablet, TAKE 1 TABLET BY MOUTH TWICE A DAY WITH A MEAL, Disp: 180 tablet, Rfl: 3   pregabalin (LYRICA) 25 MG capsule, TAKE 1 CAPSULE BY MOUTH IN THE MORNING AND 1 TO 3 CAPSULES EVERY NIGHT FOR CERVICAL RADICULOPATHY AND NEUROPATHY, Disp: 270 capsule, Rfl:  2   rosuvastatin (CRESTOR) 40 MG tablet, Take 1 tablet (40 mg total) by mouth daily., Disp: 90 tablet, Rfl: 3   triamcinolone cream (KENALOG) 0.1 %, Apply 1 application topically 2 (two) times daily., Disp: 30 g, Rfl: 0   Zoster Vaccine Adjuvanted (SHINGRIX) injection, Inject 0.5 mLs into the muscle once for 1 dose., Disp: 0.5 mL, Rfl: 1   insulin glargine (LANTUS SOLOSTAR) 100 UNIT/ML Solostar Pen, Inject into the skin., Disp: , Rfl:   No Known Allergies   Review of Systems  Constitutional: Negative.   HENT:  Positive for ear pain and hearing loss. Negative for congestion, sinus pain, sore throat  and tinnitus.   Eyes: Negative.   Respiratory:  Positive for cough and wheezing. Negative for shortness of breath.   Cardiovascular:  Negative for chest pain, palpitations and leg swelling.  Gastrointestinal:  Positive for heartburn (diet dependent). Negative for blood in stool, constipation, diarrhea, nausea and vomiting.  Genitourinary:  Negative for dysuria, flank pain, frequency, hematuria and urgency.  Musculoskeletal:  Positive for back pain and myalgias. Negative for falls, joint pain and neck pain.  Skin:  Positive for rash (on chest- chronic). Negative for itching.      Objective  Vitals:   12/04/21 1318  BP: (!) 172/88  Pulse: 81  Resp: 16  Temp: 98.3 F (36.8 C)  TempSrc: Oral  SpO2: 97%  Weight: 248 lb 4.8 oz (112.6 kg)  Height: 5\' 7"  (1.702 m)    Body mass index is 38.89 kg/m.  Physical Exam Vitals reviewed.  Constitutional:      Appearance: Normal appearance. He is obese.  HENT:     Head: Normocephalic and atraumatic.     Right Ear: External ear normal. There is impacted cerumen.     Left Ear: Tympanic membrane, ear canal and external ear normal.     Mouth/Throat:     Mouth: Mucous membranes are moist.  Eyes:     General: Lids are normal.     Extraocular Movements: Extraocular movements intact.     Conjunctiva/sclera: Conjunctivae normal.     Pupils:  Pupils are equal, round, and reactive to light.  Neck:     Thyroid: No thyroid mass, thyromegaly or thyroid tenderness.  Cardiovascular:     Rate and Rhythm: Normal rate and regular rhythm.     Pulses: Normal pulses.     Heart sounds: Normal heart sounds.  Pulmonary:     Effort: Pulmonary effort is normal. No respiratory distress.     Breath sounds: Normal breath sounds. No wheezing, rhonchi or rales.  Abdominal:     General: Abdomen is protuberant. Bowel sounds are normal.     Palpations: Abdomen is soft.     Tenderness: There is no abdominal tenderness.  Musculoskeletal:     Cervical back: Neck supple. Decreased range of motion.     Right lower leg: No edema.     Left lower leg: No edema.  Lymphadenopathy:     Head:     Right side of head: No submental or submandibular adenopathy.     Left side of head: No submental or submandibular adenopathy.     Upper Body:     Right upper body: No supraclavicular adenopathy.     Left upper body: No supraclavicular adenopathy.  Neurological:     General: No focal deficit present.     Mental Status: He is alert and oriented to person, place, and time.     GCS: GCS eye subscore is 4. GCS verbal subscore is 5. GCS motor subscore is 6.     Motor: No weakness or tremor.     Deep Tendon Reflexes:     Reflex Scores:      Tricep reflexes are 0 on the right side and 0 on the left side.      Patellar reflexes are 2+ on the right side and 2+ on the left side. Psychiatric:        Attention and Perception: Attention normal.        Mood and Affect: Mood and affect normal.        Speech: Speech normal.  Behavior: Behavior normal. Behavior is cooperative.     No results found for this or any previous visit (from the past 2160 hour(s)).   Fall Risk: Fall Risk  12/04/2021 08/18/2021 01/26/2021 10/31/2020 09/30/2020  Falls in the past year? 0 0 0 - 0  Number falls in past yr: 0 0 0 0 0  Injury with Fall? 0 0 0 0 0  Comment - - - - -  Risk for  fall due to : No Fall Risks No Fall Risks - - -  Follow up Falls prevention discussed Falls prevention discussed Falls evaluation completed - Falls evaluation completed     Functional Status Survey: Is the patient deaf or have difficulty hearing?: No Does the patient have difficulty seeing, even when wearing glasses/contacts?: No Does the patient have difficulty concentrating, remembering, or making decisions?: No Does the patient have difficulty walking or climbing stairs?: No Does the patient have difficulty dressing or bathing?: No Does the patient have difficulty doing errands alone such as visiting a doctor's office or shopping?: No    Assessment & Plan   -Prostate cancer screening and PSA options (with potential risks and benefits of testing vs not testing) were discussed along with recent recs/guidelines. -USPSTF grade A and B recommendations reviewed with patient; age-appropriate recommendations, preventive care, screening tests, etc discussed and encouraged; healthy living encouraged; see AVS for patient education given to patient -Discussed importance of 150 minutes of physical activity weekly, eat two servings of fish weekly, eat one serving of tree nuts ( cashews, pistachios, pecans, almonds.Marland Kitchen) every other day, eat 6 servings of fruit/vegetables daily and drink plenty of water and avoid sweet beverages.  -Reviewed Health Maintenance: yes  Problem List Items Addressed This Visit       Cardiovascular and Mediastinum   Essential hypertension    Chronic, historic condition BP elevated today - pt states he has not taken BP medication today and he has been out of them for a few days, is going to pick up refills later today Recommend he follow up for monitoring in 3-4 months to assess for appropriate management Continue current medications       Relevant Orders   CBC w/Diff/Platelet     Endocrine   Hyperlipidemia associated with type 2 diabetes mellitus (HCC)    Chronic,  historic condition, appears currently stable  Well managed with current regimen Continue current medications  Results of lipid panel to dictate further management efforts       Relevant Orders   Lipid Profile   Diabetes mellitus (HCC)    Chronic, historic condition  most recent A1c  7.6% as of results in Dec per endocrinology chart. He is managed by Endocrinology - will defer to them for medication management Recommend he continue current regimen Discussed need for ophthalmology to fax diabetic eye exam results to [place]- pt to call with name of office to facilitate gathering of results.  Follow up in 3 months recommended for chronic condition monitoring Patient may benefit from diabetes education to assist with nutrition and exercise      Relevant Orders   Lipid Profile   HgB A1c     Genitourinary   Chronic kidney disease (CKD) stage G2/A3, mildly decreased glomerular filtration rate (GFR) between 60-89 mL/min/1.73 square meter and albuminuria creatinine ratio greater than 300 mg/g    Chronic, historic condition CMP, CBC ordered today for monitoring Recommend he continue with management efforts and medications for diabetes and hypertension to assist with kidney health preservation  Results of testing to dictate further management       Relevant Orders   Comprehensive Metabolic Panel (CMET)     Other   Class 2 severe obesity with serious comorbidity and body mass index (BMI) of 39.0 to 39.9 in adult [facility])    Chronic, historic condition Appears unmanaged with lifestyle changes at this time Recommend patient increase exercise gradually to 150 minutes moderate intensity exercise per week Recommend dietary management as well to assist with obesity and diabetes  Follow up in 3 month for monitoring       Relevant Orders   Lipid Profile   HgB A1c   Nocturnal polysomnography (NPSG)   Tobacco use    Current activity, historic condition Discussed cessation and he is not ready to  start in earnest Appears to be passively attempting decreased use of tobacco products Recommend continued efforts to assess readiness in the future.        Other Visit Diagnoses     Annual physical exam    -  Primary   Ear pain, right     Unable to assess right TM due to cerumen impaction Ear lavage was not successful in removing impaction Recommend follow up with ENT for removal  Referral place for pt to go at his convenience    Need for shingles vaccine       Relevant Medications   Zoster Vaccine Adjuvanted [facility]) injection   Prostate cancer screening       Relevant Orders   PSA        Return in about 3 months (around 03/03/2022) for HTN, DM, chronic conditions.   I, Zyrah Wiswell E Audwin Semper, PA-C, have reviewed all documentation for this visit. The documentation on 12/04/21 for the exam, diagnosis, procedures, and orders are all accurate and complete.   Kensi Karr, Mirian Mo MPH [facility]

## 2021-12-04 NOTE — Assessment & Plan Note (Signed)
Chronic, historic condition CMP, CBC ordered today for monitoring Recommend he continue with management efforts and medications for diabetes and hypertension to assist with kidney health preservation Results of testing to dictate further management

## 2021-12-04 NOTE — Assessment & Plan Note (Signed)
Chronic, historic condition Appears unmanaged with lifestyle changes at this time Recommend patient increase exercise gradually to 150 minutes moderate intensity exercise per week Recommend dietary management as well to assist with obesity and diabetes  Follow up in 3 month for monitoring

## 2021-12-04 NOTE — Assessment & Plan Note (Addendum)
Chronic, historic condition  most recent A1c  7.6% as of results in Dec per endocrinology chart. He is managed by Endocrinology - will defer to them for medication management Recommend he continue current regimen Discussed need for ophthalmology to fax diabetic eye exam results to [place]- pt to call with name of office to facilitate gathering of results.  Follow up in 3 months recommended for chronic condition monitoring Patient may benefit from diabetes education to assist with nutrition and exercise

## 2021-12-04 NOTE — Assessment & Plan Note (Signed)
Current activity, historic condition Discussed cessation and he is not ready to start in earnest Appears to be passively attempting decreased use of tobacco products Recommend continued efforts to assess readiness in the future.

## 2021-12-04 NOTE — Assessment & Plan Note (Signed)
Chronic, historic condition BP elevated today - pt states he has not taken BP medication today and he has been out of them for a few days, is going to pick up refills later today Recommend he follow up for monitoring in 3-4 months to assess for appropriate management Continue current medications

## 2021-12-04 NOTE — Assessment & Plan Note (Signed)
Chronic, historic condition, appears currently stable  Well managed with current regimen Continue current medications  Results of lipid panel to dictate further management efforts

## 2021-12-05 DIAGNOSIS — G959 Disease of spinal cord, unspecified: Secondary | ICD-10-CM | POA: Diagnosis not present

## 2021-12-05 DIAGNOSIS — Z981 Arthrodesis status: Secondary | ICD-10-CM | POA: Diagnosis not present

## 2021-12-05 LAB — COMPREHENSIVE METABOLIC PANEL
AG Ratio: 1.3 (calc) (ref 1.0–2.5)
ALT: 20 U/L (ref 9–46)
AST: 16 U/L (ref 10–35)
Albumin: 4.1 g/dL (ref 3.6–5.1)
Alkaline phosphatase (APISO): 74 U/L (ref 35–144)
BUN: 16 mg/dL (ref 7–25)
CO2: 31 mmol/L (ref 20–32)
Calcium: 9.3 mg/dL (ref 8.6–10.3)
Chloride: 101 mmol/L (ref 98–110)
Creat: 1.29 mg/dL (ref 0.70–1.30)
Globulin: 3.2 g/dL (calc) (ref 1.9–3.7)
Glucose, Bld: 171 mg/dL — ABNORMAL HIGH (ref 65–99)
Potassium: 4.6 mmol/L (ref 3.5–5.3)
Sodium: 139 mmol/L (ref 135–146)
Total Bilirubin: 0.6 mg/dL (ref 0.2–1.2)
Total Protein: 7.3 g/dL (ref 6.1–8.1)

## 2021-12-05 LAB — LIPID PANEL
Cholesterol: 248 mg/dL — ABNORMAL HIGH (ref <200)
HDL: 37 mg/dL — ABNORMAL LOW (ref >=40)
LDL Cholesterol (Calc): 166 mg/dL (calc) — ABNORMAL HIGH
Non-HDL Cholesterol (Calc): 211 mg/dL (calc) — ABNORMAL HIGH (ref <130)
Total CHOL/HDL Ratio: 6.7 (calc) — ABNORMAL HIGH (ref <5.0)
Triglycerides: 274 mg/dL — ABNORMAL HIGH (ref <150)

## 2021-12-05 LAB — CBC WITH DIFFERENTIAL/PLATELET
Absolute Monocytes: 585 cells/uL (ref 200–950)
Basophils Absolute: 27 cells/uL (ref 0–200)
Basophils Relative: 0.3 %
Eosinophils Absolute: 180 cells/uL (ref 15–500)
Eosinophils Relative: 2 %
HCT: 48.3 % (ref 38.5–50.0)
Hemoglobin: 16.2 g/dL (ref 13.2–17.1)
Lymphs Abs: 2412 cells/uL (ref 850–3900)
MCH: 28.3 pg (ref 27.0–33.0)
MCHC: 33.5 g/dL (ref 32.0–36.0)
MCV: 84.4 fL (ref 80.0–100.0)
MPV: 10.1 fL (ref 7.5–12.5)
Monocytes Relative: 6.5 %
Neutro Abs: 5796 cells/uL (ref 1500–7800)
Neutrophils Relative %: 64.4 %
Platelets: 253 10*3/uL (ref 140–400)
RBC: 5.72 10*6/uL (ref 4.20–5.80)
RDW: 13.1 % (ref 11.0–15.0)
Total Lymphocyte: 26.8 %
WBC: 9 10*3/uL (ref 3.8–10.8)

## 2021-12-05 LAB — HEMOGLOBIN A1C
Hgb A1c MFr Bld: 6.6 % of total Hgb — ABNORMAL HIGH (ref <5.7)
Mean Plasma Glucose: 143 mg/dL
eAG (mmol/L): 7.9 mmol/L

## 2021-12-05 LAB — PSA: PSA: 1.72 ng/mL (ref <=4.00)

## 2021-12-05 MED ORDER — EZETIMIBE 10 MG PO TABS: 10.0000 mg | ORAL_TABLET | Freq: Every day | ORAL | 1 refills | Status: DC

## 2021-12-05 NOTE — Addendum Note (Signed)
Addended by: Talitha Givens on: 12/05/2021 09:35 AM   Modules accepted: Orders

## 2021-12-06 ENCOUNTER — Telehealth: Payer: Medicaid Other | Admitting: Physician Assistant

## 2021-12-06 DIAGNOSIS — R1032 Left lower quadrant pain: Secondary | ICD-10-CM

## 2021-12-06 DIAGNOSIS — R399 Unspecified symptoms and signs involving the genitourinary system: Secondary | ICD-10-CM

## 2021-12-06 NOTE — Progress Notes (Signed)
Based on what you shared with me, I feel your condition warrants further evaluation and I recommend that you be seen in a face to face visit.  Giving urinary symptoms and lower abdominal pain you need a detailed abdominal exam and urinalysis/culture to help pin point cause of symptoms and to make sure you get appropriate treatment. This is not something that can be adequately assessed via e-visit or video visit. Please be evaluated in person ASAP. ER for any worsening symptoms overnight.    NOTE: There will be NO CHARGE for this eVisit   If you are having a true medical emergency please call 911.      For an urgent face to face visit, [facility] has six urgent care centers for your convenience:     [facility] Get Driving Directions [phone] [place], [place] [address]    [facility]) Get Driving Directions [phone] [place], [place] [address]  [facility]) Get Driving Directions [phone] [address] [place],  [place]  [address]  [facility] Get Driving Directions [phone] [place], [place], [place] [address]   [facility] Get Driving Directions  [phone] [address] [place], [place] [address]   [facility] Get Driving Directions [phone] [address], [place], [place] [address]  Your MyChart E-visit questionnaire answers were reviewed by a board certified advanced clinical practitioner to complete your personal care plan based on your specific symptoms.  Thank you for using e-Visits.

## 2021-12-06 NOTE — Progress Notes (Signed)
Duplicate e-visit. Erroneous encounter.

## 2021-12-07 ENCOUNTER — Ambulatory Visit: Payer: Medicaid Other | Admitting: Nurse Practitioner

## 2021-12-07 ENCOUNTER — Other Ambulatory Visit: Payer: Self-pay

## 2021-12-07 ENCOUNTER — Encounter: Payer: Self-pay | Admitting: Nurse Practitioner

## 2021-12-07 ENCOUNTER — Ambulatory Visit: Payer: Self-pay | Admitting: *Deleted

## 2021-12-07 VITALS — BP 160/82 | HR 90 | Temp 98.8°F | Resp 16 | Ht 67.0 in | Wt 243.5 lb

## 2021-12-07 DIAGNOSIS — R1032 Left lower quadrant pain: Secondary | ICD-10-CM | POA: Diagnosis not present

## 2021-12-07 LAB — CBC WITH DIFFERENTIAL/PLATELET
Absolute Monocytes: 573 cells/uL (ref 200–950)
Basophils Absolute: 28 cells/uL (ref 0–200)
Basophils Relative: 0.3 %
Eosinophils Absolute: 169 cells/uL (ref 15–500)
Eosinophils Relative: 1.8 %
HCT: 45.4 % (ref 38.5–50.0)
Hemoglobin: 15.2 g/dL (ref 13.2–17.1)
Lymphs Abs: 2209 cells/uL (ref 850–3900)
MCH: 28.3 pg (ref 27.0–33.0)
MCHC: 33.5 g/dL (ref 32.0–36.0)
MCV: 84.4 fL (ref 80.0–100.0)
MPV: 10.3 fL (ref 7.5–12.5)
Monocytes Relative: 6.1 %
Neutro Abs: 6420 cells/uL (ref 1500–7800)
Neutrophils Relative %: 68.3 %
Platelets: 250 10*3/uL (ref 140–400)
RBC: 5.38 10*6/uL (ref 4.20–5.80)
RDW: 13.2 % (ref 11.0–15.0)
Total Lymphocyte: 23.5 %
WBC: 9.4 10*3/uL (ref 3.8–10.8)

## 2021-12-07 LAB — COMPREHENSIVE METABOLIC PANEL
AG Ratio: 1.4 (calc) (ref 1.0–2.5)
ALT: 17 U/L (ref 9–46)
AST: 15 U/L (ref 10–35)
Albumin: 4 g/dL (ref 3.6–5.1)
Alkaline phosphatase (APISO): 80 U/L (ref 35–144)
BUN/Creatinine Ratio: 11 (calc) (ref 6–22)
BUN: 15 mg/dL (ref 7–25)
CO2: 29 mmol/L (ref 20–32)
Calcium: 8.7 mg/dL (ref 8.6–10.3)
Chloride: 100 mmol/L (ref 98–110)
Creat: 1.41 mg/dL — ABNORMAL HIGH (ref 0.70–1.30)
Globulin: 2.8 g/dL (calc) (ref 1.9–3.7)
Glucose, Bld: 211 mg/dL — ABNORMAL HIGH (ref 65–99)
Potassium: 4.2 mmol/L (ref 3.5–5.3)
Sodium: 137 mmol/L (ref 135–146)
Total Bilirubin: 0.6 mg/dL (ref 0.2–1.2)
Total Protein: 6.8 g/dL (ref 6.1–8.1)

## 2021-12-07 LAB — POCT URINALYSIS DIPSTICK
Bilirubin, UA: NEGATIVE
Glucose, UA: POSITIVE — AB
Ketones, UA: NEGATIVE
Leukocytes, UA: NEGATIVE
Nitrite, UA: NEGATIVE
Protein, UA: POSITIVE — AB
Spec Grav, UA: 1.02 (ref 1.010–1.025)
Urobilinogen, UA: 0.2 E.U./dL
pH, UA: 7 (ref 5.0–8.0)

## 2021-12-07 NOTE — Telephone Encounter (Signed)
°  Chief Complaint: left abdominal pain requesting appt Symptoms: left abdominal pain , sharp pain and better today. Hurts to stand and sit up straight. Pain with touching side. "Cold" yesterday no temp. Taken. Urine "foaming and cloudy". Reports feeling better today but still constant pain.  Frequency: started Tuesday  Pertinent Negatives: Patient denies severe pain , fever.  Disposition: [] ED /[] Urgent Care (no appt availability in office) / [x] Appointment(In office/virtual)/ []  [facility]/ [] Home Care/ [] Refused Recommended Disposition /[] [facility]/ []  Follow-up with PCP Additional Notes:   Able to get appt today [facility] able to schedule. Recommended UC or ED if symptoms worsen.   Reason for Disposition  [1] MILD-MODERATE pain AND [2] constant AND [3] present > 2 hours  Answer Assessment - Initial Assessment Questions 1. LOCATION: "Where does it hurt?"      Left side abdomen 3-4 inches from where pt gives insulin shots 2. RADIATION: "Does the pain shoot anywhere else?" (e.g., chest, back)     Down under stomach  3. ONSET: "When did the pain begin?" (Minutes, hours or days ago)      Tuesday after eating  4. SUDDEN: "Gradual or sudden onset?"     Sudden  5. PATTERN "Does the pain come and go, or is it constant?"    - If constant: "Is it getting better, staying the same, or worsening?"      (Note: Constant means the pain never goes away completely; most serious pain is constant and it progresses)     - If intermittent: "How long does it last?" "Do you have pain now?"     (Note: Intermittent means the pain goes away completely between bouts)   More constant now better today  6. SEVERITY: "How bad is the pain?"  (e.g., Scale 1-10; mild, moderate, or severe)    - MILD (1-3): doesn't interfere with normal activities, abdomen soft and not tender to touch     - MODERATE (4-7): interferes with normal activities or awakens from sleep, abdomen tender to touch     -  SEVERE (8-10): excruciating pain, doubled over, unable to do any normal activities       Moderate. Wakes up from sleep . Pain to stand or sit up straight  7. RECURRENT SYMPTOM: "Have you ever had this type of stomach pain before?" If Yes, ask: "When was the last time?" and "What happened that time?"      na 8. CAUSE: "What do you think is causing the stomach pain?"     Not sue  9. RELIEVING/AGGRAVATING FACTORS: "What makes it better or worse?" (e.g., movement, antacids, bowel movement)     na 10. OTHER SYMPTOMS: "Do you have any other symptoms?" (e.g., back pain, diarrhea, fever, urination pain, vomiting)       Cold yesterday . No temp  Protocols used: Abdominal Pain - Male-A-AH

## 2021-12-07 NOTE — Progress Notes (Signed)
BP (!) 160/82 Comment: BP pill not taken today   Pulse 90    Temp 98.8 F (37.1 C) (Oral)    Resp 16    Ht 5\' 7"  (1.702 m)    Wt 243 lb 8 oz (110.5 kg)    SpO2 97%    BMI 38.14 kg/m    Subjective:    Patient ID: Cory Rivas, male    DOB: 1965-12-10, 56 y.o.   MRN: [id number]  HPI: Cory Rivas is a 56 y.o. male  Chief Complaint  Patient presents with   Abdominal Pain    Left side and it radiates to underneath belly, pain most of time while standing and pt rates it as 5. Pt states no pain while urinating but noticed this morning very foam.   Left lower abdominal pain: He says that the pain started on Tuesday night after eating dinner, chicken, gravy, corn and a biscuit. No nausea, vomiting or diarrhea or fever.  Pain is constant, intensity changes, pain radiates down under abdomen. He reports his urine is foamy.  He says that his stool was a 5-6 on the bristol scale.  He says that his bowel movements have been going more since the pain started.  He denies any frequency, hematuria, urgency or dysuria. He denies any back pain.  His abdomen is very tender on the left lower quadrant, no CVA tenderness. Ordering stat labs and ct with contrast. Instructed patient to hold metformin for 48 hours after ct.  Patient ate at 130 pm so unable to get scan done today, he is scheduled for scan at 11 am tomorrow. Discussed with patient that if pain intensifies he needs to seek care at the emergency room.   Relevant past medical, surgical, family and social history reviewed and updated as indicated. Interim medical history since our last visit reviewed. Allergies and medications reviewed and updated.  Review of Systems  Constitutional: Negative for fever or weight change.  Respiratory: Negative for cough and shortness of breath.   Cardiovascular: Negative for chest pain or palpitations.  Gastrointestinal: Positive for abdominal pain, no bowel changes.  Musculoskeletal: Negative for gait problem or  joint swelling.  Skin: Negative for rash.  Neurological: Negative for dizziness or headache.  No other specific complaints in a complete review of systems (except as listed in HPI above).      Objective:    BP (!) 160/82 Comment: BP pill not taken today   Pulse 90    Temp 98.8 F (37.1 C) (Oral)    Resp 16    Ht 5\' 7"  (1.702 m)    Wt 243 lb 8 oz (110.5 kg)    SpO2 97%    BMI 38.14 kg/m   Wt Readings from Last 3 Encounters:  12/07/21 243 lb 8 oz (110.5 kg)  12/04/21 248 lb 4.8 oz (112.6 kg)  08/18/21 241 lb 14.4 oz (109.7 kg)    Physical Exam  Constitutional: Patient appears well-developed and well-nourished. Obese  No distress.  HEENT: head atraumatic, normocephalic, pupils equal and reactive to light,  neck supple Cardiovascular: Normal rate, regular rhythm and normal heart sounds.  No murmur heard. No BLE edema. Pulmonary/Chest: Effort normal and breath sounds normal. No respiratory distress. Abdominal: Distended, tenderness noted to left lower quadrant Psychiatric: Patient has a normal mood and affect. behavior is normal. Judgment and thought content normal.   Results for orders placed or performed in visit on 12/07/21  POCT urinalysis dipstick  Result Value Ref  Range   Color, UA Yellow    Clarity, UA Cloudy    Glucose, UA Positive (A) Negative   Bilirubin, UA Negative    Ketones, UA Negative    Spec Grav, UA 1.020 1.010 - 1.025   Blood, UA Moderate    pH, UA 7.0 5.0 - 8.0   Protein, UA Positive (A) Negative   Urobilinogen, UA 0.2 0.2 or 1.0 E.U./dL   Nitrite, UA Negative    Leukocytes, UA Negative Negative   Appearance Yellow    Odor Foul/Sweet       Assessment & Plan:   1. Left lower quadrant abdominal pain -clear liquid diet tonight, npo tomorrow - CT Abdomen Pelvis W Contrast; Future - POCT urinalysis dipstick - CBC with Differential/Platelet; Future - Comprehensive metabolic panel; Future   Follow up plan: Return if symptoms worsen or fail to  improve.

## 2021-12-08 ENCOUNTER — Ambulatory Visit
Admission: RE | Admit: 2021-12-08 | Discharge: 2021-12-08 | Disposition: A | Discharge status: Home / Self Care | Payer: Medicaid Other | Source: Ambulatory Visit | Attending: Nurse Practitioner | Admitting: Nurse Practitioner

## 2021-12-08 ENCOUNTER — Other Ambulatory Visit: Payer: Self-pay | Admitting: Nurse Practitioner

## 2021-12-08 DIAGNOSIS — R109 Unspecified abdominal pain: Secondary | ICD-10-CM | POA: Diagnosis not present

## 2021-12-08 DIAGNOSIS — R1032 Left lower quadrant pain: Secondary | ICD-10-CM | POA: Insufficient documentation

## 2021-12-08 DIAGNOSIS — K5792 Diverticulitis of intestine, part unspecified, without perforation or abscess without bleeding: Secondary | ICD-10-CM

## 2021-12-08 MED ORDER — METRONIDAZOLE 500 MG PO TABS: 500.0000 mg | ORAL_TABLET | Freq: Two times a day (BID) | ORAL | 0 refills | Status: AC

## 2021-12-08 MED ORDER — CIPROFLOXACIN HCL 500 MG PO TABS: 500.0000 mg | ORAL_TABLET | Freq: Two times a day (BID) | ORAL | 0 refills | Status: AC

## 2021-12-08 MED ORDER — IOHEXOL 300 MG/ML  SOLN
100.0000 mL | Freq: Once | INTRAMUSCULAR | Status: AC | PRN
  Administered 2021-12-08: 100 mL via INTRAVENOUS

## 2021-12-12 ENCOUNTER — Ambulatory Visit: Payer: Medicaid Other | Admitting: Physical Therapy

## 2021-12-12 ENCOUNTER — Encounter: Payer: Medicaid Other | Admitting: Physical Therapy

## 2021-12-13 ENCOUNTER — Ambulatory Visit: Payer: Medicaid Other | Admitting: Nurse Practitioner

## 2021-12-14 ENCOUNTER — Ambulatory Visit: Payer: Medicaid Other | Admitting: Physical Therapy

## 2021-12-19 ENCOUNTER — Other Ambulatory Visit: Payer: Self-pay

## 2021-12-19 ENCOUNTER — Ambulatory Visit: Payer: Medicaid Other | Attending: Physician Assistant | Admitting: Physical Therapy

## 2021-12-19 ENCOUNTER — Encounter: Payer: Medicaid Other | Admitting: Physical Therapy

## 2021-12-19 DIAGNOSIS — M6281 Muscle weakness (generalized): Secondary | ICD-10-CM | POA: Diagnosis not present

## 2021-12-19 DIAGNOSIS — M62838 Other muscle spasm: Secondary | ICD-10-CM | POA: Diagnosis not present

## 2021-12-19 DIAGNOSIS — M542 Cervicalgia: Secondary | ICD-10-CM | POA: Diagnosis not present

## 2021-12-19 DIAGNOSIS — R293 Abnormal posture: Secondary | ICD-10-CM | POA: Insufficient documentation

## 2021-12-19 DIAGNOSIS — M5412 Radiculopathy, cervical region: Secondary | ICD-10-CM | POA: Diagnosis not present

## 2021-12-19 NOTE — Therapy (Signed)
[facility] [address], [place], [address] Phone: [phone]   Fax:  [phone]  Physical Therapy Evaluation  Patient Details  Name: Cory Rivas MRN: [id number] Date of Birth: April 01, 1966 Referring Provider (PT): Ethlyn Daniels, [place]   Encounter Date: 12/19/2021   PT End of Session - 12/19/21 1110     Visit Number 1    Number of Visits 24    Date for PT Re-Evaluation 03/13/22    Authorization Type [place] [employer]ICA[employer] BLUE reporting period from 12/19/2021    Authorization Time Period needs auth    Authorization - Visit Number 1    Authorization - Number of Visits 1    Progress Note Due on Visit 10    PT Start Time 0950    PT Stop Time 1030    PT Time Calculation (min) 40 min    Activity Tolerance Patient tolerated treatment well    Behavior During Therapy [facility] for tasks assessed/performed             Past Medical History:  Diagnosis Date   Bulging of cervical intervertebral disc    Diabetes mellitus without complication ([place])    Gout    High cholesterol    Hypertension    Stress due to family tension 04/15/2019    Past Surgical History:  Procedure Laterality Date   CHOLECYSTECTOMY     COLONOSCOPY WITH PROPOFOL N/A 03/10/2020   Procedure: COLONOSCOPY WITH PROPOFOL;  Surgeon: Lucilla Lame, MD;  Location: [facility] ENDOSCOPY;  Service: Endoscopy;  Laterality: N/A;    There were no vitals filed for this visit.    Subjective Assessment - 12/19/21 0956     Subjective Patient is known to this clinic and PT and was seen for this condition last year. He had to discontinue due to insurance limitations. He states his neck has been really tight. He reports he is having stinging pain over bilateral upper traps, has pain and numbness in digits 3-4 of his right hand, pain over the back of his head (sometimes like a headache/toothache). He was put on muscle relaxers that have helped a little. He has has  pain in his upper back that feels like something is grabbing and twisting. States the only pain that is constant is in the mid cervical spine, back of his head, and top of thoracic spine. He states he gets intermittent tingling  and pain in his left UE to his fingers. If he sleeps on his side everything gets really tight. He shut the car door on his right fingers the other day. Patient states his balance is off. He trips but catches himself.  He used an AD after he got out of the hospital but he gave it up. He has a Secondary school teacher. He gets unsteady when he tries to get things off the floor.    Pertinent History Patient is a 56 y.o. male who presents to outpatient physical therapy with a referral for medical diagnosis cervical spine fusion, cervical myelopathy. This patient's chief complaints consist of tightness, pain, restricted ROM at the cervical spine and back of head and B UE pain/paresthesia leading to the following functional deficits: difficulty with physical activities, prolonged sitting or standing, using B UE, picking things up, reaching, yardwork, using tractor, driving, riding in vehicle, turning his head, viewing his surroundings, traveling, rolling over at night, sleeping on his side, dressing, ADLs, IADLs.  Relevant past medical history and comorbidities include cervical myelopathy leading  to C2-6 laminectomy and  C2-T2 fusion performed 02/01/2021, HTN, DMII, hyperlipidemia, lumbar degenerative disc disease, current smoker, obesity, systolic murmur, chronic kidney disease, cervical myelopathy, gout, diverticulitis.  Patient does not read/write well and needs assistance with completing paperwork.    Limitations House hold activities;Lifting;Walking;Standing;Sitting;Writing;Reading;Other (comment)   physical activities, prolonged sitting or standing, B UE use, picking things up, reaching, yardwork, using tractor, driving, riding, turning his head, viewing his surroundings, traveling, rolling over at night,  sleeping on his side, dressing, ADLs, IADLs   Diagnostic tests Cervical spine radiograph repoirt 12/05/2021: "FINDINGS:   Lateral, swimmer's, AP view. Status post C2-C6 laminectomy with posterior   fusion hardware extending from C2 down to T2. The appearance of the   hardware is similar to the prior examination. Alignment is grossly   unchanged from the prior.   IMPRESSION:   No significant interval change status post cervicothoracic fusion. "    Patient Stated Goals he would like to be able to turn his head to the right like he can to the left. He wants to get everything back to how he was when he left PT last year.    Currently in Pain? Yes    Pain Score 4    W: 7/10; B: 4/10   Pain Radiating Towards B UE    Pain Onset More than a month ago    Pain Frequency Constant    Aggravating Factors  doing to much, using his arms, sleeping on his side    Pain Relieving Factors muscle relaxer, sleeping on his back with a small pillow    Effect of Pain on Daily Activities Functional Limitations: difficulty with physical activities, prolonged sitting or standing, using B UE, picking things up, reaching, yardwork, using tractor, driving, riding in vehicle, turning his head, viewing his surroundings, traveling, rolling over at night, sleeping on his side, dressing, ADLs, IADLs.              [facility] PT Assessment - 12/19/21 1003       Assessment   Medical Diagnosis s/p cervical spine fusion, cervical myelopathy    Referring Provider (PT) Ethlyn Daniels, PA    Onset Date/Surgical Date 02/01/21    Hand Dominance Right    Prior Therapy previous episode of care for same condition with improvement but limited by insurance auth      Precautions   Precautions Other (comment)   don't lift over a certain amount of weight (unsure but thinks maybe 10#?), bending over, picking heavy stuff up (he does not know if/when these precautions expire. He was told not to bend over or pick up anything heavy.      Balance Screen   Has the patient fallen in the past 6 months No    Has the patient had a decrease in activity level because of a fear of falling?  Yes    Is the patient reluctant to leave their home because of a fear of falling?  Yes      [place] Private residence    Living Arrangements Other relatives   lives with sister   Available Help at Discharge Family    Type of [place] to enter    Entrance Stairs-Number of Steps 4 that need to be redone    Entrance Stairs-Rails Can reach both;Right;Left   states it is "floppy"   Home Layout One level    [place] - 2 wheels  reacher     Prior Function   Level of Independence Independent    Vocation On disability    Vocation Requirements previoulsy drove truck, putting in telephone cable underground    Leisure ride [place] and equipment, fishing, Secondary school teacher   Overall Cognitive Status Within Functional Limits for tasks assessed              OBJECTIVE  OBSERVATION/INSPECTION Posture Posture (seated): forward head, rounded shoulders, increased thoracic kyphosis. Minimal space between ears and top of shoulders with thickness noted throughout cervical spine region.   Posture correction: unable due to extreme stiffness.  Anthropometrics Tremor: none Body composition: obese Muscle bulk: hypertrophy at cervical spine musculature.  Skin: The incision sites appear to be well healed with no excessive redness, warmth, drainage or signs of infection present.   Functional Mobility Transfers: sit <> stand I  Gait: grossly WFL for household and short community ambulation (as observed in the clinic). More detailed gait analysis deferred to later date as needed.    NEUROLOGICAL Upper Motor Neuron Screen Babinski, Hoffman's and Clonus (ankle) negative bilaterally.  Dermatomes C3, C4, C6-T1 diminished on R compared to L. C5 appears equal and intact to light  touch bilaterally.   SPINE MOTION Cervical Spine AROM *Indicates pain Flexion: 12 Extension: 0 Side Flexion:   R 5  L 10 Rotation:  R 10 feels really tight down his back and B UT L 25 Uncomfortable tightness noted while performing all motions.    PERIPHERAL JOINT MOTION (in degrees) Active Range of Motion (AROM) Comments: B UE screen: Overhead motion limited to ~ 120 degrees, ER limited to ~ 80 degrees on L (WFL on R), IR R to SIJ and left to L5. Elbow and wrist appears grossly WFL bilaterally. Pain with L IR.    MUSCLE PERFORMANCE (MMT):  *Indicates pain 12/19/21 Date Date  Joint/Motion R/L R/L R/L  Shoulder     Flexion 3+/3+ / /  Abduction (C5) 4/3+ / /  External rotation 3/3 / /  Internal rotation 4/4 / /  Elbow     Flexion (C6) 4/4 / /  Extension (C7) 3/3 / /  Wrist     Flexion (C7) 4-/4- / /  Extension (C6) 3/3 / /  Radial deviation 4+/4+ / /  Ulnar deviation (C8) 4+/4+ / /  Hand     Thumb extension (C8) 4/4 / /  Finger abduction (T1) unclear / /  Grip (C8) Decr. B / /  Hip     Flexion (L1, L2) 5/5 / /  Abduction (seated) 5/5 / /  Adduction (seated) 5/5 / /  Knee     Extension (L3) 5/5 / /  Flexion (S2) 4/5 / /  Ankle/Foot     Dorsiflexion (L4) 5/5 / /  Great toe extension (L5) 5/5 / /  Eversion (S1) 5/5 / /  Plantarflexion (S1) 4+/5 / /  Comments: Patient weakness most notable when unable to hold against resistance over several seconds.  ACCESSORY MOTION:  deferred  PALPATION: SEATED Hypertrophy noted at B cervical paraspinals TTP at bilateral UT, suboccipital region, mid cervical spine, bilateral thoracic paraspinals.  Numbness noted throughout B posterior cervical spine musculature  Patient felt the need to shake out both UE after examination with report of increased symptoms in B UE.   Objective measurements completed on examination: See above findings.      PT Education - 12/19/21 1133     Education Details Education on  diagnosis,  prognosis, POC, anatomy and physiology of current condition    Person(s) Educated Patient    Methods Explanation    Comprehension Verbalized understanding;Need further instruction              PT Short Term Goals - 12/19/21 1111       PT SHORT TERM GOAL #1   Title Be independent with initial home exercise program for self-management of symptoms.    Baseline Initial HEP to be provided visit 2 as appropriate (12/19/2021);    Time 2    Period Weeks    Status New    Target Date 01/02/22               PT Long Term Goals - 12/19/21 1112       PT LONG TERM GOAL #1   Title Be independent with a long-term home exercise program for self-management of symptoms.    Baseline Initial HEP to be provided visit 2 as appropriate (12/19/2021);    Time 12    Period Weeks    Status New   TARGET DATE FOR ALL LONG TERM GOALS: 03/13/2022     PT LONG TERM GOAL #2   Title Demonstrate improved FOTO score by 10 units to demonstrate improvement in overall condition and self-reported functional ability.    Baseline to be measured at visit 2 as appropriate, patient needs assistance (11/21/2021);    Time 12    Period Weeks    Status New      PT LONG TERM GOAL #3   Title Pt will increase UE strength of by at least 1/2 MMT grade in order to demonstrate improvement in strength and function.    Baseline ranges from 3/5 to 4+/5 - see objective exam (12/19/2021);    Time 12    Period Weeks    Status New      PT LONG TERM GOAL #4   Title Improve cervical AROM rotation to equal or greater than 45 degrees each direction and cervical extension to 15 degrees to improve ability to check blind spot when driving, view surroundings, and allow patient to complete valued activities with less difficulty.    Baseline Extension: 0 Rotation: R 10,  L 25 (12/19/2021);    Time 12    Period Weeks    Status New      PT LONG TERM GOAL #5   Title Complete community, work and/or recreational activities without limitation due  to current condition    Baseline Functional Limitations: difficulty with physical activities, prolonged sitting or standing, using B UE, picking things up, reaching, yardwork, using tractor, driving, riding in vehicle, turning his head, viewing his surroundings, traveling, rolling over at night, sleeping on his side, dressing, ADLs, IADLs.    Time 12    Period Weeks    Status New                    Plan - 12/19/21 1124     Clinical Impression Statement Patient is a 56 y.o. male referred to outpatient physical therapy with a medical diagnosis of s/p cervical spine fusion, cervical myelopathy who presents with signs and symptoms consistent with chronic neck pain and and stiffness with radulopathy into B UE including paresthesia, neurologic weakness and pain s/p C2-6 laminectomy and  C2-T2 fusion performed 02/01/2021 for cervical myelopathy. Patient demonstrates severe muscle tension and loss of cervical spine range of motion at the C1-C2 joint where his spine is not fused and should  have up to 45 degrees rotation and 15 degrees extension available. He also demonstrates significant weakness in his B UE and minimal activities increase his pain and symptoms so much that it severely restricts his daily life and hobbies including traveling short distances and socializing with friends/family.  Patient presents with significant pain, paresthesia, posture, upper cervical spine joint stiffness, ROM, balance, muscle performance (strength/power/endurance) and activity tolerance impairments that are limiting ability to complete his usual activities such as  any physical activity, prolonged sitting or standing, using B UE, picking things up, reaching, yardwork, using tractor, driving, riding in vehicle, turning his head, viewing his surroundings, traveling, rolling over at night, sleeping on his side, dressing, ADLs, and IADLs  without difficulty and decrease his quality of life. Patient requires assistance with  paperwork due to poor literacy skills. Patient will benefit from skilled physical therapy intervention to address current body structure impairments and activity limitations to improve function and work towards goals set in current POC in order to return to prior level of function or maximal functional improvement.    Personal Factors and Comorbidities Comorbidity 3+;Past/Current Experience;Fitness;Time since onset of injury/illness/exacerbation;Education;Social Background    Comorbidities Relevant past medical history and comorbidities include cervical myelopathy leading to C2-6 laminectomy and  C2-T2 fusion performed 02/01/2021, HTN, DMII, hyperlipidemia, lumbar degenerative disc disease, current smoker, obesity, systolic murmur, chronic kidney disease, cervical myelopathy, gout, diverticulitis.    Examination-Activity Limitations Bathing;Hygiene/Grooming;Squat;Lift;Bed Mobility;Bend;Locomotion Level;Stand;Reach Overhead;Carry;Sit;Dressing;Sleep    Examination-Participation Restrictions Community Activity;Yard Work;Driving;Interpersonal Relationship    Stability/Clinical Decision Making Evolving/Moderate complexity    Clinical Decision Making Moderate    Rehab Potential Good    PT Frequency 2x / week    PT Duration 12 weeks    PT Treatment/Interventions ADLs/Self Care Home Management;Aquatic Therapy;Cryotherapy;Moist Heat;Electrical Stimulation;Neuromuscular re-education;Therapeutic exercise;Therapeutic activities;Balance training;Patient/family education;Manual techniques;Dry needling;Passive range of motion    PT Next Visit Plan complete FOTO, manual therapy, strengthening and ROM exercises as tolerated    PT Home Exercise Plan TBD    Consulted and Agree with Plan of Care Patient             Patient will benefit from skilled therapeutic intervention in order to improve the following deficits and impairments:  Improper body mechanics, Pain, Decreased scar mobility, Increased muscle spasms,  Postural dysfunction, Decreased activity tolerance, Decreased endurance, Decreased range of motion, Decreased strength, Hypomobility, Impaired perceived functional ability, Impaired UE functional use, Decreased balance, Impaired flexibility, Obesity  Visit Diagnosis: Cervicalgia  Radiculopathy, cervical region  Muscle weakness (generalized)  Other muscle spasm  Abnormal posture     Problem List Patient Active Problem List   Diagnosis Date Noted   S/P cervical spinal fusion 08/18/2021   Former heavy tobacco smoker 08/18/2021   Gout 08/18/2021   Other chronic pain 27/03/2375   Systolic murmur 28/31/5176   Tobacco use 10/26/2020   Polyp of ascending colon    Rectal polyp    Class 2 severe obesity with serious comorbidity and body mass index (BMI) of 39.0 to 39.9 in adult ([place]) 04/15/2019   Chronic kidney disease (CKD) stage G2/A3, mildly decreased glomerular filtration rate (GFR) between 60-89 mL/min/1.73 square meter and albuminuria creatinine ratio greater than 300 mg/g 04/15/2019   Proteinuria 04/15/2019   Hyperlipidemia associated with type 2 diabetes mellitus ([place]) 03/18/2019   Diabetes mellitus ([place]) 03/18/2019   Idiopathic chronic gout of multiple sites without tophus 03/18/2019   Essential hypertension 03/18/2019   Dermatitis 03/18/2019   Lumbar degenerative disc disease 11/22/2015   Cervical myelopathy (  [place]) 11/22/2015   Osteoarthritis of spine with radiculopathy, lumbar region 11/22/2015    Everlean Alstrom. Graylon Good, PT, DPT 12/19/21, 11:33 AM   [facility] [address] [place], [address] Phone: [phone]   Fax:  [phone]  Name: ARGIE APPLEGATE MRN: [id number] Date of Birth: 01-Jul-1966

## 2021-12-21 ENCOUNTER — Ambulatory Visit: Payer: Medicaid Other | Admitting: Physical Therapy

## 2021-12-26 ENCOUNTER — Encounter: Payer: Self-pay | Admitting: Physical Therapy

## 2021-12-26 ENCOUNTER — Ambulatory Visit: Payer: Medicaid Other | Admitting: Physical Therapy

## 2021-12-26 ENCOUNTER — Other Ambulatory Visit: Payer: Self-pay

## 2021-12-26 DIAGNOSIS — M6281 Muscle weakness (generalized): Secondary | ICD-10-CM

## 2021-12-26 DIAGNOSIS — R293 Abnormal posture: Secondary | ICD-10-CM | POA: Diagnosis not present

## 2021-12-26 DIAGNOSIS — M5412 Radiculopathy, cervical region: Secondary | ICD-10-CM

## 2021-12-26 DIAGNOSIS — M62838 Other muscle spasm: Secondary | ICD-10-CM | POA: Diagnosis not present

## 2021-12-26 DIAGNOSIS — M542 Cervicalgia: Secondary | ICD-10-CM

## 2021-12-26 NOTE — Therapy (Addendum)
[place] [facility] ?[address] ?[place], [place], [address] ?Phone: [phone]   Fax:  [phone] ? ?Physical Therapy Treatment ? ?Patient Details  ?Name: Cory Rivas ?MRN: [id number] ?Date of Birth: September 24, 1966 ?Referring Provider (PT): Lauren Nonda Lou, PA ? ? ?Encounter Date: 12/26/2021 ? ? PT End of Session - 12/26/21 1514   ? ? Visit Number 2   ? Number of Visits 24   ? Date for PT Re-Evaluation 03/13/22   ? Authorization Type [place] MEDICAID HEALTHY BLUE reporting period from 12/19/2021   ? Authorization Time Period HB [id number] 3/14-4/20 12 PT visits   ? Authorization - Visit Number 1   ? Authorization - Number of Visits 12   ? Progress Note Due on Visit 10   ? PT Start Time [phone]   ? PT Stop Time 1030   ? PT Time Calculation (min) 40 min   ? Activity Tolerance Patient tolerated treatment well   ? Behavior During Therapy [facility] for tasks assessed/performed   ? ?  ?  ? ?  ? ? ?Past Medical History:  ?Diagnosis Date  ? Bulging of cervical intervertebral disc   ? Diabetes mellitus without complication ([place])   ? Gout   ? High cholesterol   ? Hypertension   ? Stress due to family tension 04/15/2019  ? ? ?Past Surgical History:  ?Procedure Laterality Date  ? CHOLECYSTECTOMY    ? COLONOSCOPY WITH PROPOFOL N/A 03/10/2020  ? Procedure: COLONOSCOPY WITH PROPOFOL;  Surgeon: Lucilla Lame, MD;  Location[facility] ENDOSCOPY;  Service: Endoscopy;  Laterality: N/A;  ? ? ?There were no vitals filed for this visit. ? ? Subjective Assessment - 12/26/21 0958   ? ? Subjective Patient reports his neck and left shoulder hurt this morning. He rates the pain there as 5/10. He states his right hand has been giving him trouble. He feels numb in the 2nd and 3rd digits and keeps dropping his phone. He was a little bit sore after his PT eval.   ? Pertinent History Patient is a 57 y.o. male who presents to outpatient physical therapy with a referral for medical diagnosis cervical spine  fusion, cervical myelopathy. This patient's chief complaints consist of tightness, pain, restricted ROM at the cervical spine and back of head and B UE pain/paresthesia leading to the following functional deficits: difficulty with physical activities, prolonged sitting or standing, using B UE, picking things up, reaching, yardwork, using tractor, driving, riding in vehicle, turning his head, viewing his surroundings, traveling, rolling over at night, sleeping on his side, dressing, ADLs, IADLs.  Relevant past medical history and comorbidities include cervical myelopathy leading to C2-6 laminectomy and  C2-T2 fusion performed 02/01/2021, HTN, DMII, hyperlipidemia, lumbar degenerative disc disease, current smoker, obesity, systolic murmur, chronic kidney disease, cervical myelopathy, gout, diverticulitis.  Patient does not read/write well and needs assistance with completing paperwork.   ? Limitations House hold activities;Lifting;Walking;Standing;Sitting;Writing;Reading;Other (comment)   physical activities, prolonged sitting or standing, B UE use, picking things up, reaching, yardwork, using tractor, driving, riding, turning his head, viewing his surroundings, traveling, rolling over at night, sleeping on his side, dressing, ADLs, IADLs  ? Diagnostic tests Cervical spine radiograph repoirt 12/05/2021: "FINDINGS:   Lateral, swimmer's, AP view. Status post C2-C6 laminectomy with posterior   fusion hardware extending from C2 down to T2. The appearance of the   hardware is similar to the prior examination. Alignment is grossly   unchanged from the prior.  IMPRESSION:   No significant interval change status post cervicothoracic fusion. "   ? Patient Stated Goals he would like to be able to turn his head to the right like he can to the left. He wants to get everything back to how he was when he left PT last year.   ? Currently in Pain? Yes   ? Pain Score 5    ? ?  ?  ? ?  ? ?OBJECTIVE ? ?SELF-REPORTED FUNCTION ?FOTO score:  38/100 (neck questionnaire) ? ? ?TREATMENT:  ? ?Therapeutic exercise: to centralize symptoms and improve ROM, strength, muscular endurance, and activity tolerance required for successful completion of functional activities.  ?- Upper body ergometer at level 5 encourage joint nutrition, warm tissue, induce analgesic effect of aerobic exercise, improve muscular strength and endurance,  and prepare for remainder of session. 6 min changing directions every 1 min. Seat setting 6.  ?(Manual - see below)  ?- seated SNAG rotation 1x5 each side with towel (R hand slipping off).  ?- seated median nerve glide, 1x5 each side.  ?- Education on HEP including handout  ? ?Manual therapy: to reduce pain and tissue tension, improve range of motion, neuromodulation, in order to promote improved ability to complete functional activities. ?HOOKLYING ?- STM to posterior cervical spine musculature focusing on B suboccipitals, UT, LS and to B SCM ?- gentle intermittent manual traction  ?- PROM rotation with overpressure as tolerated each direction focusing on upper cervical spine. 2x10 each side with 2 second hold.  ? ?Pt required multimodal cuing for proper technique and to facilitate improved neuromuscular control, strength, range of motion, and functional ability resulting in improved performance and form. ? ?Additional (unbilled time) spent assisting patient with FOTO questionnaire.  ? ?HOME EXERCISE PROGRAM ?Access Code: [email or web address] ?URL: [email or web address] ?Date: 12/26/2021 ?Prepared by: Rosita Kea ? ?Exercises ?Seated Assisted Cervical Rotation with Towel - 2 x daily - 2 sets - 10 reps - 5 seconds hold ?Standing Median Nerve Glide - 2 x daily - 1 sets - 15 reps ? ? PT Education - 12/26/21 1514   ? ? Education Details Reviewed cancelation/no-show policy with patient and confirmed patient has correct phone number for clinic; patient verbalized understanding. Exercise purpose/form. Self management techniques. HEP   ?  Person(s) Educated Patient   ? Methods Explanation;Demonstration;Tactile cues;Verbal cues;Handout   ? Comprehension Verbalized understanding;Returned demonstration;Verbal cues required;Tactile cues required;Need further instruction   ? ?  ?  ? ?  ? ? ? PT Short Term Goals - 12/19/21 1111   ? ?  ? PT SHORT TERM GOAL #1  ? Title Be independent with initial home exercise program for self-management of symptoms.   ? Baseline Initial HEP to be provided visit 2 as appropriate (12/19/2021);   ? Time 2   ? Period Weeks   ? Status New   ? Target Date 01/02/22   ? ?  ?  ? ?  ? ? ? ? PT Long Term Goals - 12/19/21 1112   ? ?  ? PT LONG TERM GOAL #1  ? Title Be independent with a long-term home exercise program for self-management of symptoms.   ? Baseline Initial HEP to be provided visit 2 as appropriate (12/19/2021);   ? Time 12   ? Period Weeks   ? Status New   TARGET DATE FOR ALL LONG TERM GOALS: 03/13/2022  ?  ? PT LONG TERM GOAL #2  ? Title Demonstrate improved FOTO score by 10 units to demonstrate  improvement in overall condition and self-reported functional ability.   ? Baseline to be measured at visit 2 as appropriate, patient needs assistance (11/21/2021);   ? Time 12   ? Period Weeks   ? Status New   ?  ? PT LONG TERM GOAL #3  ? Title Pt will increase UE strength of by at least 1/2 MMT grade in order to demonstrate improvement in strength and function.   ? Baseline ranges from 3/5 to 4+/5 - see objective exam (12/19/2021);   ? Time 12   ? Period Weeks   ? Status New   ?  ? PT LONG TERM GOAL #4  ? Title Improve cervical AROM rotation to equal or greater than 45 degrees each direction and cervical extension to 15 degrees to improve ability to check blind spot when driving, view surroundings, and allow patient to complete valued activities with less difficulty.   ? Baseline Extension: 0 Rotation: R 10,  L 25 (12/19/2021);   ? Time 12   ? Period Weeks   ? Status New   ?  ? PT LONG TERM GOAL #5  ? Title Complete community, work  and/or recreational activities without limitation due to current condition   ? Baseline Functional Limitations: difficulty with physical activities, prolonged sitting or standing, using B UE, picking things

## 2021-12-28 ENCOUNTER — Ambulatory Visit: Payer: Medicaid Other | Admitting: Physical Therapy

## 2021-12-28 ENCOUNTER — Other Ambulatory Visit: Payer: Self-pay

## 2021-12-28 ENCOUNTER — Encounter: Payer: Self-pay | Admitting: Physical Therapy

## 2021-12-28 DIAGNOSIS — M62838 Other muscle spasm: Secondary | ICD-10-CM | POA: Diagnosis not present

## 2021-12-28 DIAGNOSIS — M5412 Radiculopathy, cervical region: Secondary | ICD-10-CM | POA: Diagnosis not present

## 2021-12-28 DIAGNOSIS — R293 Abnormal posture: Secondary | ICD-10-CM | POA: Diagnosis not present

## 2021-12-28 DIAGNOSIS — M542 Cervicalgia: Secondary | ICD-10-CM | POA: Diagnosis not present

## 2021-12-28 DIAGNOSIS — M6281 Muscle weakness (generalized): Secondary | ICD-10-CM | POA: Diagnosis not present

## 2021-12-28 MED ORDER — LISINOPRIL 40 MG PO TABS: 40.0000 mg | ORAL_TABLET | Freq: Every day | ORAL | 0 refills | Status: DC

## 2021-12-28 NOTE — Therapy (Signed)
[place] ?[facility] ?[address] ?[place], [place], [address] ?Phone: [phone]   Fax:  [phone] ? ?Physical Therapy Treatment ? ?Patient Details  ?Name: Cory Rivas ?MRN: [id number] ?Date of Birth: 1965-12-03 ?Referring Provider (PT): Lauren Nonda Lou, PA ? ? ?Encounter Date: 12/28/2021 ? ? PT End of Session - 12/28/21 1525   ? ? Visit Number 3   ? Number of Visits 24   ? Date for PT Re-Evaluation 03/13/22   ? Authorization Type [place] MEDICAID HEALTHY BLUE reporting period from 12/19/2021   ? Authorization Time Period HB [id number] 3/14-4/20 12 PT visits   ? Authorization - Visit Number 2   ? Authorization - Number of Visits 12   ? Progress Note Due on Visit 10   ? PT Start Time [phone]   ? PT Stop Time 1030   ? PT Time Calculation (min) 40 min   ? Activity Tolerance Patient tolerated treatment well   ? Behavior During Therapy [facility] for tasks assessed/performed   ? ?  ?  ? ?  ? ? ?Past Medical History:  ?Diagnosis Date  ? Bulging of cervical intervertebral disc   ? Diabetes mellitus without complication ([place])   ? Gout   ? High cholesterol   ? Hypertension   ? Stress due to family tension 04/15/2019  ? ? ?Past Surgical History:  ?Procedure Laterality Date  ? CHOLECYSTECTOMY    ? COLONOSCOPY WITH PROPOFOL N/A 03/10/2020  ? Procedure: COLONOSCOPY WITH PROPOFOL;  Surgeon: Lucilla Lame, MD;  Location: [facility] ENDOSCOPY;  Service: Endoscopy;  Laterality: N/A;  ? ? ?There were no vitals filed for this visit. ? ? Subjective Assessment - 12/28/21 0953   ? ? Subjective Patient reports he has 2/10 pain in his bilateral upper traps and his upper back upon arrival. He also has paresthesia in his right 2nd and 3rd finger digits. He states his hands were more numb after last PT session. He has not done his HEP since last PT session. He had to take a tire off and patch it with some help from his cousin yesterday. He also tried to clean the house yesterday.   ? Pertinent  History Patient is a 56 y.o. male who presents to outpatient physical therapy with a referral for medical diagnosis cervical spine fusion, cervical myelopathy. This patient's chief complaints consist of tightness, pain, restricted ROM at the cervical spine and back of head and B UE pain/paresthesia leading to the following functional deficits: difficulty with physical activities, prolonged sitting or standing, using B UE, picking things up, reaching, yardwork, using tractor, driving, riding in vehicle, turning his head, viewing his surroundings, traveling, rolling over at night, sleeping on his side, dressing, ADLs, IADLs.  Relevant past medical history and comorbidities include cervical myelopathy leading to C2-6 laminectomy and  C2-T2 fusion performed 02/01/2021, HTN, DMII, hyperlipidemia, lumbar degenerative disc disease, current smoker, obesity, systolic murmur, chronic kidney disease, cervical myelopathy, gout, diverticulitis.  Patient does not read/write well and needs assistance with completing paperwork.   ? Limitations House hold activities;Lifting;Walking;Standing;Sitting;Writing;Reading;Other (comment)   physical activities, prolonged sitting or standing, B UE use, picking things up, reaching, yardwork, using tractor, driving, riding, turning his head, viewing his surroundings, traveling, rolling over at night, sleeping on his side, dressing, ADLs, IADLs  ? Diagnostic tests Cervical spine radiograph repoirt 12/05/2021: "FINDINGS:   Lateral, swimmer's, AP view. Status post C2-C6 laminectomy with posterior   fusion hardware extending from C2 down to T2.  The appearance of the   hardware is similar to the prior examination. Alignment is grossly   unchanged from the prior.   IMPRESSION:   No significant interval change status post cervicothoracic fusion. "   ? Patient Stated Goals he would like to be able to turn his head to the right like he can to the left. He wants to get everything back to how he was when he  left PT last year.   ? Currently in Pain? Yes   ? Pain Score 2    ? ?  ?  ? ?  ? ? ? ? ?TREATMENT:  ?  ?Therapeutic exercise: to centralize symptoms and improve ROM, strength, muscular endurance, and activity tolerance required for successful completion of functional activities.  ?- Upper body ergometer at level 5 encourage joint nutrition, warm tissue, induce analgesic effect of aerobic exercise, improve muscular strength and endurance,  and prepare for remainder of session. 5 min changing directions every 1 min. Seat setting 6. ?- seated median nerve glide, 1x15 each side. (Reports pain at 5th digit of both hands) ?  ?Manual therapy: to reduce pain and tissue tension, improve range of motion, neuromodulation, in order to promote improved ability to complete functional activities. ?HOOKLYING with feet elevated ?- STM to posterior cervical spine musculature focusing on B suboccipitals, UT, LS and to B SCM ?- gentle intermittent manual traction  ?- PROM rotation with overpressure as tolerated each direction focusing on upper cervical spine. 2x10 each side with 2 second hold.  ?PRONE ?- CPA to thoracic spine from ~ T3-T8, 20-40 reps at each level, grade III-IV  ? ?Modality: (unbilled) ?Dry needling performed to B UT to decrease pain and spasms along patient?s neck, head, and B UE region with patient in supine with feet elevated utilizing (1) dry needle(s) .69m x 36mwith (2) sticks at right UT and (1) stick at left UT . Patient educated about the risks and benefits from therapy and verbally consents to treatment.  ?Dry needling performed by SaEverlean AlstromSnGraylon GoodT, DPT who is certified in this technique. ? ?  ?Pt required multimodal cuing for proper technique and to facilitate improved neuromuscular control, strength, range of motion, and functional ability resulting in improved performance and form. ? ?  ?HOME EXERCISE PROGRAM ?Access Code: [email or web address]: [email or web address] ?Date: 12/26/2021 ?Prepared  by: SaRosita Kea  ?Exercises ?Seated Assisted Cervical Rotation with Towel - 2 x daily - 2 sets - 10 reps - 5 seconds hold ?Standing Median Nerve Glide - 2 x daily - 1 sets - 15 reps ?  ? ? PT Education - 12/28/21 1525   ? ? Education Details Exercise purpose/form. Self management techniques. Dry needling   ? Person(s) Educated Patient   ? Methods Explanation;Demonstration;Tactile cues;Verbal cues   ? Comprehension Verbalized understanding;Returned demonstration;Tactile cues required;Verbal cues required;Need further instruction   ? ?  ?  ? ?  ? ? ? PT Short Term Goals - 12/19/21 1111   ? ?  ? PT SHORT TERM GOAL #1  ? Title Be independent with initial home exercise program for self-management of symptoms.   ? Baseline Initial HEP to be provided visit 2 as appropriate (12/19/2021);   ? Time 2   ? Period Weeks   ? Status New   ? Target Date 01/02/22   ? ?  ?  ? ?  ? ? ? ? PT Long Term Goals - 12/19/21 1112   ? ?  ? PT LONG TERM  GOAL #1  ? Title Be independent with a long-term home exercise program for self-management of symptoms.   ? Baseline Initial HEP to be provided visit 2 as appropriate (12/19/2021);   ? Time 12   ? Period Weeks   ? Status New   TARGET DATE FOR ALL LONG TERM GOALS: 03/13/2022  ?  ? PT LONG TERM GOAL #2  ? Title Demonstrate improved FOTO score by 10 units to demonstrate improvement in overall condition and self-reported functional ability.   ? Baseline to be measured at visit 2 as appropriate, patient needs assistance (11/21/2021);   ? Time 12   ? Period Weeks   ? Status New   ?  ? PT LONG TERM GOAL #3  ? Title Pt will increase UE strength of by at least 1/2 MMT grade in order to demonstrate improvement in strength and function.   ? Baseline ranges from 3/5 to 4+/5 - see objective exam (12/19/2021);   ? Time 12   ? Period Weeks   ? Status New   ?  ? PT LONG TERM GOAL #4  ? Title Improve cervical AROM rotation to equal or greater than 45 degrees each direction and cervical extension to 15 degrees to  improve ability to check blind spot when driving, view surroundings, and allow patient to complete valued activities with less difficulty.   ? Baseline Extension: 0 Rotation: R 10,  L 25 (12/19/2021);   ? Time 12

## 2022-01-01 ENCOUNTER — Encounter: Payer: Self-pay | Admitting: Physical Therapy

## 2022-01-01 ENCOUNTER — Ambulatory Visit: Payer: Medicaid Other | Admitting: Physical Therapy

## 2022-01-01 ENCOUNTER — Other Ambulatory Visit: Payer: Self-pay

## 2022-01-01 DIAGNOSIS — M542 Cervicalgia: Secondary | ICD-10-CM | POA: Diagnosis not present

## 2022-01-01 DIAGNOSIS — M6281 Muscle weakness (generalized): Secondary | ICD-10-CM

## 2022-01-01 DIAGNOSIS — M5412 Radiculopathy, cervical region: Secondary | ICD-10-CM | POA: Diagnosis not present

## 2022-01-01 DIAGNOSIS — M62838 Other muscle spasm: Secondary | ICD-10-CM

## 2022-01-01 DIAGNOSIS — R293 Abnormal posture: Secondary | ICD-10-CM

## 2022-01-01 NOTE — Therapy (Signed)
[facility] ?[address] ?[place], [place], [address] ?Phone: [phone]   Fax:  [phone] ? ?Physical Therapy Treatment ? ?Patient Details  ?Name: Cory Rivas ?MRN: [id number] ?Date of Birth: September 28, 1966 ?Referring Provider (PT): Lauren Nonda Lou, PA ? ? ?Encounter Date: 01/01/2022 ? ? PT End of Session - 01/01/22 6606   ? ? Visit Number 4   ? Number of Visits 24   ? Date for PT Re-Evaluation 03/13/22   ? Authorization Type [place] MEDICAID HEALTHY BLUE reporting period from 12/19/2021   ? Authorization Time Period HB [id number] 3/14-4/20 12 PT visits   ? Authorization - Visit Number 3   ? Authorization - Number of Visits 12   ? Progress Note Due on Visit 10   ? PT Start Time [phone]   ? PT Stop Time 1034   ? PT Time Calculation (min) 41 min   ? Activity Tolerance Patient tolerated treatment well   ? Behavior During Therapy [facility] for tasks assessed/performed   ? ?  ?  ? ?  ? ? ?Past Medical History:  ?Diagnosis Date  ? Bulging of cervical intervertebral disc   ? Diabetes mellitus without complication ([place])   ? Gout   ? High cholesterol   ? Hypertension   ? Stress due to family tension 04/15/2019  ? ? ?Past Surgical History:  ?Procedure Laterality Date  ? CHOLECYSTECTOMY    ? COLONOSCOPY WITH PROPOFOL N/A 03/10/2020  ? Procedure: COLONOSCOPY WITH PROPOFOL;  Surgeon: Lucilla Lame, MD;  Location: [facility] ENDOSCOPY;  Service: Endoscopy;  Laterality: N/A;  ? ? ?There were no vitals filed for this visit. ? ? Subjective Assessment - 01/01/22 0953   ? ? Subjective Patient reports some "soreness" and "tightness" in the left upper trap region upon arrival. States he felt better after dry needling at last session and his neck didn't really hurt until this morning. His B UE continues to feel about the same. He states his HEP is going well. He mostly watched TV all weekend.   ? Pertinent History Patient is a 56 y.o. male who presents to outpatient physical therapy with  a referral for medical diagnosis cervical spine fusion, cervical myelopathy. This patient's chief complaints consist of tightness, pain, restricted ROM at the cervical spine and back of head and B UE pain/paresthesia leading to the following functional deficits: difficulty with physical activities, prolonged sitting or standing, using B UE, picking things up, reaching, yardwork, using tractor, driving, riding in vehicle, turning his head, viewing his surroundings, traveling, rolling over at night, sleeping on his side, dressing, ADLs, IADLs.  Relevant past medical history and comorbidities include cervical myelopathy leading to C2-6 laminectomy and  C2-T2 fusion performed 02/01/2021, HTN, DMII, hyperlipidemia, lumbar degenerative disc disease, current smoker, obesity, systolic murmur, chronic kidney disease, cervical myelopathy, gout, diverticulitis.  Patient does not read/write well and needs assistance with completing paperwork.   ? Limitations House hold activities;Lifting;Walking;Standing;Sitting;Writing;Reading;Other (comment)   physical activities, prolonged sitting or standing, B UE use, picking things up, reaching, yardwork, using tractor, driving, riding, turning his head, viewing his surroundings, traveling, rolling over at night, sleeping on his side, dressing, ADLs, IADLs  ? Diagnostic tests Cervical spine radiograph repoirt 12/05/2021: "FINDINGS:   Lateral, swimmer's, AP view. Status post C2-C6 laminectomy with posterior   fusion hardware extending from C2 down to T2. The appearance of the   hardware is similar to the prior examination. Alignment is grossly   unchanged from  the prior.   IMPRESSION:   No significant interval change status post cervicothoracic fusion. "   ? Patient Stated Goals he would like to be able to turn his head to the right like he can to the left. He wants to get everything back to how he was when he left PT last year.   ? Currently in Pain? Yes   ? Pain Score 4    ? ?  ?  ? ?   ? ? ? ? ?TREATMENT:  ?  ?Therapeutic exercise: to centralize symptoms and improve ROM, strength, muscular endurance, and activity tolerance required for successful completion of functional activities.  ?- Upper body ergometer at level 5 encourage joint nutrition, warm tissue, induce analgesic effect of aerobic exercise, improve muscular strength and endurance,  and prepare for remainder of session. 6 min changing directions every 1 min. Seat setting 6. ?- seated median nerve glide, 1x15 each side. (Reports pain at 5th digit of both hands) ?  ?Manual therapy: to reduce pain and tissue tension, improve range of motion, neuromodulation, in order to promote improved ability to complete functional activities. ?PRONE ?- STM to bilateral UT, LS, ?- CPA to thoracic spine from ~ T3-T8, ~20 reps at each level, grade III-IV  ?HOOKLYING with feet elevated ?- STM to posterior cervical spine musculature focusing on B suboccipitals, UT, LS ?- PROM rotation with overpressure/oscillations as tolerated each direction focusing on upper cervical spine. 2x10 left and 3x10 right with 1-5 second hold. Notes pain at left upper c-spine to right and bilateral upper c-spine to left.  ?- Attempted R upper cervical spine mobilization (with slight R rotation and left sidebend) grade II-IV, but could only feel it at left upper cervical spine so discontinued.  ?  ?Modality: (unbilled) ?Dry needling performed to B UT to decrease pain and spasms along patient?s neck, head, and B UE region with patient in prone utilizing (2) dry needle(s) .69m x 370mwith (4) sticks at right UT and (2) sticks at left UT . Patient educated about the risks and benefits from therapy and verbally consents to treatment.  ?Dry needling performed by SaEverlean AlstromSnGraylon GoodT, DPT who is certified in this technique. ?  ?  ?Pt required multimodal cuing for proper technique and to facilitate improved neuromuscular control, strength, range of motion, and functional ability resulting  in improved performance and form. ?  ?  ?HOME EXERCISE PROGRAM ?Access Code: [email or web address]: [email or web address] ?Date: 12/26/2021 ?Prepared by: SaRosita Kea  ?Exercises ?Seated Assisted Cervical Rotation with Towel - 2 x daily - 2 sets - 10 reps - 5 seconds hold ?Standing Median Nerve Glide - 2 x daily - 1 sets - 15 reps ?  ? ? ? PT Education - 01/01/22 0958   ? ? Education Details Exercise purpose/form. Self management techniques. Dry needling   ? Person(s) Educated Patient   ? Methods Explanation;Demonstration;Tactile cues;Verbal cues   ? Comprehension Verbalized understanding;Returned demonstration;Verbal cues required;Tactile cues required;Need further instruction   ? ?  ?  ? ?  ? ? ? PT Short Term Goals - 01/01/22 1046   ? ?  ? PT SHORT TERM GOAL #1  ? Title Be independent with initial home exercise program for self-management of symptoms.   ? Baseline Initial HEP to be provided visit 2 as appropriate (12/19/2021);   ? Time 2   ? Period Weeks   ? Status Achieved   ? Target Date 01/02/22   ? ?  ?  ? ?  ? ? ? ?  PT Long Term Goals - 12/19/21 1112   ? ?  ? PT LONG TERM GOAL #1  ? Title Be independent with a long-term home exercise program for self-management of symptoms.   ? Baseline Initial HEP to be provided visit 2 as appropriate (12/19/2021);   ? Time 12   ? Period Weeks   ? Status New   TARGET DATE FOR ALL LONG TERM GOALS: 03/13/2022  ?  ? PT LONG TERM GOAL #2  ? Title Demonstrate improved FOTO score by 10 units to demonstrate improvement in overall condition and self-reported functional ability.   ? Baseline to be measured at visit 2 as appropriate, patient needs assistance (11/21/2021);   ? Time 12   ? Period Weeks   ? Status New   ?  ? PT LONG TERM GOAL #3  ? Title Pt will increase UE strength of by at least 1/2 MMT grade in order to demonstrate improvement in strength and function.   ? Baseline ranges from 3/5 to 4+/5 - see objective exam (12/19/2021);   ? Time 12   ? Period Weeks   ? Status New    ?  ? PT LONG TERM GOAL #4  ? Title Improve cervical AROM rotation to equal or greater than 45 degrees each direction and cervical extension to 15 degrees to improve ability to check blind spot when drivin

## 2022-01-02 ENCOUNTER — Encounter: Payer: Medicaid Other | Admitting: Physical Therapy

## 2022-01-02 DIAGNOSIS — Z5181 Encounter for therapeutic drug level monitoring: Secondary | ICD-10-CM | POA: Diagnosis not present

## 2022-01-02 DIAGNOSIS — Z0189 Encounter for other specified special examinations: Secondary | ICD-10-CM | POA: Diagnosis not present

## 2022-01-02 DIAGNOSIS — E1169 Type 2 diabetes mellitus with other specified complication: Secondary | ICD-10-CM | POA: Diagnosis not present

## 2022-01-02 DIAGNOSIS — Z8679 Personal history of other diseases of the circulatory system: Secondary | ICD-10-CM | POA: Diagnosis not present

## 2022-01-02 LAB — HEMOGLOBIN A1C: Hemoglobin A1C: 6.5

## 2022-01-04 ENCOUNTER — Ambulatory Visit: Payer: Medicaid Other | Admitting: Physical Therapy

## 2022-01-04 ENCOUNTER — Encounter: Payer: Self-pay | Admitting: Physical Therapy

## 2022-01-04 ENCOUNTER — Other Ambulatory Visit: Payer: Self-pay

## 2022-01-04 VITALS — BP 179/95 | HR 73

## 2022-01-04 DIAGNOSIS — M62838 Other muscle spasm: Secondary | ICD-10-CM

## 2022-01-04 DIAGNOSIS — R293 Abnormal posture: Secondary | ICD-10-CM

## 2022-01-04 DIAGNOSIS — M6281 Muscle weakness (generalized): Secondary | ICD-10-CM

## 2022-01-04 DIAGNOSIS — M5412 Radiculopathy, cervical region: Secondary | ICD-10-CM

## 2022-01-04 DIAGNOSIS — M542 Cervicalgia: Secondary | ICD-10-CM

## 2022-01-04 NOTE — Therapy (Signed)
?OUTPATIENT PHYSICAL THERAPY TREATMENT NOTE ? ? ?Patient Name: Cory Rivas ?MRN: [id number] ?DOB:04-Feb-1966, 56 y.o., male ?Today's Date: 01/04/2022 ? ?PCP: Delsa Grana, PA-C ?REFERRING PROVIDER: Hazeline Junker, PA ? ? PT End of Session - 01/04/22 1742   ? ? Visit Number 5   ? Number of Visits 24   ? Date for PT Re-Evaluation 03/13/22   ? Authorization Type [place] MEDICAID HEALTHY BLUE reporting period from 12/19/2021   ? Authorization Time Period HB [id number] 3/14-4/20 12 PT visits   ? Authorization - Visit Number 4   ? Authorization - Number of Visits 12   ? Progress Note Due on Visit 10   ? PT Start Time 1738   ? PT Stop Time 9024   ? PT Time Calculation (min) 42 min   ? Activity Tolerance Patient tolerated treatment well   ? Behavior During Therapy [facility] for tasks assessed/performed   ? ?  ?  ? ?  ? ? ?Past Medical History:  ?Diagnosis Date  ? Bulging of cervical intervertebral disc   ? Diabetes mellitus without complication ([place])   ? Gout   ? High cholesterol   ? Hypertension   ? Stress due to family tension 04/15/2019  ? ?Past Surgical History:  ?Procedure Laterality Date  ? CHOLECYSTECTOMY    ? COLONOSCOPY WITH PROPOFOL N/A 03/10/2020  ? Procedure: COLONOSCOPY WITH PROPOFOL;  Surgeon: Lucilla Lame, MD;  Location: [facility] ENDOSCOPY;  Service: Endoscopy;  Laterality: N/A;  ? ?Patient Active Problem List  ? Diagnosis Date Noted  ? S/P cervical spinal fusion 08/18/2021  ? Former heavy tobacco smoker 08/18/2021  ? Gout 08/18/2021  ? Other chronic pain 10/26/2020  ? Systolic murmur 09/73/5329  ? Tobacco use 10/26/2020  ? Polyp of ascending colon   ? Rectal polyp   ? Class 2 severe obesity with serious comorbidity and body mass index (BMI) of 39.0 to 39.9 in adult [facility]) 04/15/2019  ? Chronic kidney disease (CKD) stage G2/A3, mildly decreased glomerular filtration rate (GFR) between 60-89 mL/min/1.73 square meter and albuminuria creatinine ratio greater than 300 mg/g 04/15/2019  ? Proteinuria 04/15/2019  ? Hyperlipidemia  associated with type 2 diabetes mellitus ([place]) 03/18/2019  ? Diabetes mellitus ([place]) 03/18/2019  ? Idiopathic chronic gout of multiple sites without tophus 03/18/2019  ? Essential hypertension 03/18/2019  ? Dermatitis 03/18/2019  ? Lumbar degenerative disc disease 11/22/2015  ? Cervical myelopathy ([place]) 11/22/2015  ? Osteoarthritis of spine with radiculopathy, lumbar region 11/22/2015  ? ? ?REFERRING DIAG: s/p cervical spine fusion, cervical myelopathy ? ?THERAPY DIAG:  ?Cervicalgia ? ?Radiculopathy, cervical region ? ?Muscle weakness (generalized) ? ?Other muscle spasm ? ?Abnormal posture ? ?PERTINENT HISTORY: Patient is a 56 y.o. male who presents to outpatient physical therapy with a referral for medical diagnosis cervical spine fusion, cervical myelopathy. This patient's chief complaints consist of tightness, pain, restricted ROM at the cervical spine and back of head and B UE pain/paresthesia leading to the following functional deficits: difficulty with physical activities, prolonged sitting or standing, using B UE, picking things up, reaching, yardwork, using tractor, driving, riding in vehicle, turning his head, viewing his surroundings, traveling, rolling over at night, sleeping on his side, dressing, ADLs, IADLs. Relevant past medical history and comorbidities include cervical myelopathy leading to C2-6 laminectomy and C2-T2 fusion performed 02/01/2021, HTN, DMII, hyperlipidemia, lumbar degenerative disc disease, current smoker, obesity, systolic murmur, chronic kidney disease, cervical myelopathy, gout, diverticulitis. Patient does not read/write well and needs assistance with completing paperwork. ? ?PRECAUTIONS:  don't lift over a certain amount of weight (unsure but thinks maybe 10#?), bending over, picking heavy stuff up (he does not know if/when these precautions expire. He was told not to bend over or pick up anything heavy. ? ? ?SUBJECTIVE: Patient reports he is feeling well and he does not have pain  but does have a feeling of tightness in the right upper trap. He states his neck is feeling better overall and he can turn his head to the right more. He saw his diabetes doctor recently and his BP was high there. He states he has been out of medication until earlier today when he picked it up and took it. Right index and middle fingers are still numb. Patient states he has only taken his morning dose of blood pressure medication. He had some shooting pain when he was riding to [place] yesterday.  ? ?PAIN:  ?Are you having pain? No ? ?OBJECTIVE ? ?SELF-REPORTED FUNCTION ?FOTO score: 37/100 (neck questionnaire) ? ?Vitals:  ? 01/04/22 1754  ?BP: (!) 179/95  ?Pulse: 73  ?SpO2: 99%  ?Seated, automatic adult large cuff, left arm ? ? ?TODAY'S TREATMENT:  ? ?Patient assisted with filling out FOTO due to difficulty reading due to poor eyesight. (3 min unbilled).  ? ?Therapeutic exercise: to centralize symptoms and improve ROM, strength, muscular endurance, and activity tolerance required for successful completion of functional activities.  ?- Upper body ergometer at level 5 encourage joint nutrition, warm tissue, induce analgesic effect of aerobic exercise, improve muscular strength and endurance,  and prepare for remainder of session. 6 min changing directions every 1 min. Seat setting 6. ?- vitals measurement (see above) ?- seated median nerve glide, 1x15 each side.  ?  ?Manual therapy: to reduce pain and tissue tension, improve range of motion, neuromodulation, in order to promote improved ability to complete functional activities. ?PRONE ?- CPA to thoracic spine from ~ T3-T8, ~20 reps at each level, grade III-IV  ? ?HOOKLYING with feet elevated ?- STM to posterior cervical spine musculature focusing on B suboccipitals, UT, LS (R > L).  ?- PROM rotation with overpressure/oscillations as tolerated each direction focusing on upper cervical spine. 1x10 each side with 5-10 second hold/oscillations.   ? ?  ?Pt required  multimodal cuing for proper technique and to facilitate improved neuromuscular control, strength, range of motion, and functional ability resulting in improved performance and form. ?  ?  ?HOME EXERCISE PROGRAM: ?Access Code: [email or web address] ?URL: [email or web address] ?Date: 12/26/2021 ?Prepared by: Rosita Kea ?  ?Exercises ?Seated Assisted Cervical Rotation with Towel - 2 x daily - 2 sets - 10 reps - 5 seconds hold ?Standing Median Nerve Glide - 2 x daily - 1 sets - 15 reps ?  ?  ? ? ?PATIENT EDUCATION: ?Education details: Exercise purpose/form. ?Person educated: Patient ?Education method: Explanation, Demonstration, Tactile cues, and Verbal cues ?Education comprehension: verbalized understanding, returned demonstration, verbal cues required, tactile cues required, and needs further education ? ? ? PT Short Term Goals   ? ?  ? PT SHORT TERM GOAL #1  ? Title Be independent with initial home exercise program for self-management of symptoms.   ? Baseline Initial HEP to be provided visit 2 as appropriate (12/19/2021);   ? Time 2   ? Period Weeks   ? Status Achieved   ? Target Date 01/02/22   ? ?  ?  ? ?  ? ? ? PT Long Term Goals - 12/19/21 1112   ? TARGET DATE FOR ALL  LONG TERM GOALS: 03/13/2022 ?  ? PT LONG TERM GOAL #1  ? Title Be independent with a long-term home exercise program for self-management of symptoms.   ? Baseline Initial HEP to be provided visit 2 as appropriate (12/19/2021);   ? Time 12   ? Period Weeks   ? Status Progressing  ?  ? PT LONG TERM GOAL #2  ? Title Demonstrate improved FOTO score by 10 units to demonstrate improvement in overall condition and self-reported functional ability.   ? Baseline to be measured at visit 2 as appropriate, patient needs assistance (11/21/2021); 38 at visit#2 (12/26/2021); 37 at visit#5 (01/04/2022);   ? Time 12   ? Period Weeks   ? Status On-going  ?  ? PT LONG TERM GOAL #3  ? Title Pt will increase UE strength of by at least 1/2 MMT grade in order to demonstrate  improvement in strength and function.   ? Baseline ranges from 3/5 to 4+/5 - see objective exam (12/19/2021);   ? Time 12   ? Period Weeks   ? Status On-going  ?  ? PT LONG TERM GOAL #4  ? Title Improve cerv

## 2022-01-09 ENCOUNTER — Other Ambulatory Visit: Payer: Self-pay

## 2022-01-09 ENCOUNTER — Ambulatory Visit: Payer: Medicaid Other | Admitting: Physical Therapy

## 2022-01-09 ENCOUNTER — Encounter: Payer: Self-pay | Admitting: Physical Therapy

## 2022-01-09 DIAGNOSIS — M5412 Radiculopathy, cervical region: Secondary | ICD-10-CM

## 2022-01-09 DIAGNOSIS — R293 Abnormal posture: Secondary | ICD-10-CM | POA: Diagnosis not present

## 2022-01-09 DIAGNOSIS — M6281 Muscle weakness (generalized): Secondary | ICD-10-CM | POA: Diagnosis not present

## 2022-01-09 DIAGNOSIS — M62838 Other muscle spasm: Secondary | ICD-10-CM

## 2022-01-09 DIAGNOSIS — M542 Cervicalgia: Secondary | ICD-10-CM

## 2022-01-09 NOTE — Therapy (Signed)
?OUTPATIENT PHYSICAL THERAPY TREATMENT NOTE ? ? ?Patient Name: Cory Rivas ?MRN: [id number] ?DOB:[birth year], 56 y.o., male ?Today's Date: 01/09/2022 ? ?PCP: Delsa Grana, PA-C ?REFERRING PROVIDER: Delsa Grana, PA-C ? ? PT End of Session - 01/09/22 0953   ? ? Visit Number 6   ? Number of Visits 24   ? Date for PT Re-Evaluation 03/13/22   ? Authorization Type [place] MEDICAID HEALTHY BLUE reporting period from 12/19/2021   ? Authorization Time Period HB [id number] 3/14-4/20 12 PT visits   ? Authorization - Visit Number 5   ? Authorization - Number of Visits 12   ? Progress Note Due on Visit 10   ? PT Start Time [id number]   ? PT Stop Time 1028   ? PT Time Calculation (min) 38 min   ? Activity Tolerance Patient tolerated treatment well   ? Behavior During Therapy [facility] for tasks assessed/performed   ? ?  ?  ? ?  ? ? ? ?Past Medical History:  ?Diagnosis Date  ? Bulging of cervical intervertebral disc   ? Diabetes mellitus without complication ([place])   ? Gout   ? High cholesterol   ? Hypertension   ? Stress due to family tension 04/15/2019  ? ?Past Surgical History:  ?Procedure Laterality Date  ? CHOLECYSTECTOMY    ? COLONOSCOPY WITH PROPOFOL N/A 03/10/2020  ? Procedure: COLONOSCOPY WITH PROPOFOL;  Surgeon: Lucilla Lame, MD;  Location: [facility] ENDOSCOPY;  Service: Endoscopy;  Laterality: N/A;  ? ?Patient Active Problem List  ? Diagnosis Date Noted  ? S/P cervical spinal fusion 08/18/2021  ? Former heavy tobacco smoker 08/18/2021  ? Gout 08/18/2021  ? Other chronic pain 10/26/2020  ? Systolic murmur 08/67/6195  ? Tobacco use 10/26/2020  ? Polyp of ascending colon   ? Rectal polyp   ? Class 2 severe obesity with serious comorbidity and body mass index (BMI) of 39.0 to 39.9 in adult [facility]) 04/15/2019  ? Chronic kidney disease (CKD) stage G2/A3, mildly decreased glomerular filtration rate (GFR) between 60-89 mL/min/1.73 square meter and albuminuria creatinine ratio greater than 300 mg/g 04/15/2019  ? Proteinuria 04/15/2019  ? Hyperlipidemia  associated with type 2 diabetes mellitus ([place]) 03/18/2019  ? Diabetes mellitus ([place]) 03/18/2019  ? Idiopathic chronic gout of multiple sites without tophus 03/18/2019  ? Essential hypertension 03/18/2019  ? Dermatitis 03/18/2019  ? Lumbar degenerative disc disease 11/22/2015  ? Cervical myelopathy ([place]) 11/22/2015  ? Osteoarthritis of spine with radiculopathy, lumbar region 11/22/2015  ? ? ?REFERRING DIAG: s/p cervical spine fusion, cervical myelopathy ? ?THERAPY DIAG:  ?Cervicalgia ? ?Radiculopathy, cervical region ? ?Muscle weakness (generalized) ? ?Other muscle spasm ? ?Abnormal posture ? ?PERTINENT HISTORY: Patient is a 57 y.o. male who presents to outpatient physical therapy with a referral for medical diagnosis cervical spine fusion, cervical myelopathy. This patient's chief complaints consist of tightness, pain, restricted ROM at the cervical spine and back of head and B UE pain/paresthesia leading to the following functional deficits: difficulty with physical activities, prolonged sitting or standing, using B UE, picking things up, reaching, yardwork, using tractor, driving, riding in vehicle, turning his head, viewing his surroundings, traveling, rolling over at night, sleeping on his side, dressing, ADLs, IADLs. Relevant past medical history and comorbidities include cervical myelopathy leading to C2-6 laminectomy and C2-T2 fusion performed 02/01/2021, HTN, DMII, hyperlipidemia, lumbar degenerative disc disease, current smoker, obesity, systolic murmur, chronic kidney disease, cervical myelopathy, gout, diverticulitis. Patient does not read/write well and needs assistance with completing paperwork. ? ?PRECAUTIONS:  don't lift over a certain amount of weight (unsure but thinks maybe 10#?), bending over, picking heavy stuff up (he does not know if/when these precautions expire. He was told not to bend over or pick up anything heavy. ? ? ?SUBJECTIVE: Patient reports his neck did not bother him as much this  weekend. He helped his family with some work on the tractor and did get a cramp in his upper back when turning around. He states he has a bit of tightness in the base of his neck L > R upon arrival. States his hands are about the same and he is still dropping things. He dropped a bowl over the weekend. HEP is going well. Patient reports he has not checked his blood pressure yet but he has been taking his antihypertensive.  ? ?PAIN:  ?Are you having pain? No ? ? ?TODAY'S TREATMENT:  ? ?Therapeutic exercise: to centralize symptoms and improve ROM, strength, muscular endurance, and activity tolerance required for successful completion of functional activities.  ?- NuStep level 6 using bilateral upper and lower extremities. Seat/handle setting 7/6. For improved extremity mobility, muscular endurance, and activity tolerance; and to induce the analgesic effect of aerobic exercise, stimulate improved joint nutrition, and prepare body structures and systems for following interventions. x 5  minutes. Average SPM = 65. ?- seated scapular shrugs holding 5#DB in each hand, focusing on completely relaxing muscle at end of eccentric phase. 2x10 ?- seated row at [place] with wide grip, chest braced against seat, 2x10 with 5#. Pillow folded over at chest.  ?- seated chest press with plus at [facility] machine, 2x10 with 5#.  ?- seated (pillow folded at chest) lat pull at [place], 2x10 with 15 ? ?  ?Manual therapy: to reduce pain and tissue tension, improve range of motion, neuromodulation, in order to promote improved ability to complete functional activities. ?PRONE ?- CPA to thoracic spine from ~ T3-T10, ~20 reps at each level, grade III-IV  ? ?HOOKLYING with feet elevated ?- STM to posterior cervical spine musculature focusing on B suboccipitals. ?- PROM rotation with overpressure/oscillations as tolerated each direction focusing on upper cervical spine. 1x10 each side with 10 second hold/oscillations.   ? ? ?Pt required  multimodal cuing for proper technique and to facilitate improved neuromuscular control, strength, range of motion, and functional ability resulting in improved performance and form. ?  ?  ?HOME EXERCISE PROGRAM: ?Access Code: [email or web address] ?URL: [email or web address] ?Date: 12/26/2021 ?Prepared by: Rosita Kea ?  ?Exercises ?Seated Assisted Cervical Rotation with Towel - 2 x daily - 2 sets - 10 reps - 5 seconds hold ?Standing Median Nerve Glide - 2 x daily - 1 sets - 15 reps ?  ?PATIENT EDUCATION: ?Education details: Exercise purpose/form. ?Person educated: Patient ?Education method: Explanation, Demonstration, Tactile cues, and Verbal cues ?Education comprehension: verbalized understanding, returned demonstration, verbal cues required, tactile cues required, and needs further education ? ? ? PT Short Term Goals   ? ?  ? PT SHORT TERM GOAL #1  ? Title Be independent with initial home exercise program for self-management of symptoms.   ? Baseline Initial HEP to be provided visit 2 as appropriate (12/19/2021);   ? Time 2   ? Period Weeks   ? Status Achieved   ? Target Date 01/02/22   ? ?  ?  ? ?  ? ? ? PT Long Term Goals - 12/19/21 1112   ? TARGET DATE FOR ALL LONG TERM GOALS: 03/13/2022 ?  ? PT LONG  TERM GOAL #1  ? Title Be independent with a long-term home exercise program for self-management of symptoms.   ? Baseline Initial HEP to be provided visit 2 as appropriate (12/19/2021);   ? Time 12   ? Period Weeks   ? Status Progressing  ?  ? PT LONG TERM GOAL #2  ? Title Demonstrate improved FOTO score by 10 units to demonstrate improvement in overall condition and self-reported functional ability.   ? Baseline to be measured at visit 2 as appropriate, patient needs assistance (11/21/2021); 38 at visit#2 (12/26/2021); 37 at visit#5 (01/04/2022);   ? Time 12   ? Period Weeks   ? Status On-going  ?  ? PT LONG TERM GOAL #3  ? Title Pt will increase UE strength of by at least 1/2 MMT grade in order to demonstrate  improvement in strength and function.   ? Baseline ranges from 3/5 to 4+/5 - see objective exam (12/19/2021);   ? Time 12   ? Period Weeks   ? Status On-going  ?  ? PT LONG TERM GOAL #4  ? Title Improve cervical AROM

## 2022-01-11 ENCOUNTER — Ambulatory Visit: Payer: Self-pay

## 2022-01-11 ENCOUNTER — Encounter: Payer: Self-pay | Admitting: Internal Medicine

## 2022-01-11 ENCOUNTER — Ambulatory Visit: Payer: Medicaid Other | Admitting: Internal Medicine

## 2022-01-11 ENCOUNTER — Ambulatory Visit: Payer: Medicaid Other | Admitting: Physical Therapy

## 2022-01-11 VITALS — BP 136/82 | HR 82 | Temp 98.2°F | Resp 18 | Ht 67.0 in | Wt 241.1 lb

## 2022-01-11 DIAGNOSIS — R112 Nausea with vomiting, unspecified: Secondary | ICD-10-CM | POA: Diagnosis not present

## 2022-01-11 DIAGNOSIS — R21 Rash and other nonspecific skin eruption: Secondary | ICD-10-CM | POA: Diagnosis not present

## 2022-01-11 MED ORDER — TRIAMCINOLONE ACETONIDE 0.5 % EX OINT: 1.0000 "application " | TOPICAL_OINTMENT | Freq: Two times a day (BID) | CUTANEOUS | 0 refills | Status: DC

## 2022-01-11 MED ORDER — OMEPRAZOLE 40 MG PO CPDR: 40.0000 mg | DELAYED_RELEASE_CAPSULE | Freq: Every day | ORAL | 3 refills | Status: DC

## 2022-01-11 MED ORDER — ONDANSETRON 8 MG PO TBDP: 8.0000 mg | ORAL_TABLET | Freq: Three times a day (TID) | ORAL | 0 refills | Status: DC | PRN

## 2022-01-11 NOTE — Patient Instructions (Signed)
It was great seeing you today! ? ?Plan discussed at today's visit: ?-Steroid cream increased in potency, can thin and discolor the skin ?-Nausea medication sent to pharmacy to take as needed ?-Medication for acid reflex sent as well to take daily  ? ?Follow up in: 2 weeks  ? ?Take care and let us know if you have any questions or concerns prior to your next visit. ? ?Dr. Rosana Berger ? ?

## 2022-01-11 NOTE — Telephone Encounter (Signed)
Pt has been vomiting yesterday and today / he hasnt tried eating anything today / pt feels fine but then he will throw up avery few hours / no other symptoms  ? ? ?Chief Complaint: Vomiting x 2 days. No other symptoms. "I feel fine, I just throw up about 3 times a day." ?Symptoms: Above ?Frequency: Yesterday ?Pertinent Negatives: Patient denies diarrhea ?Disposition: '[]'$ ED /'[]'$ Urgent Care (no appt availability in office) / '[x]'$ Appointment(In office/virtual)/ '[]'$  [facility]/ '[]'$ Home Care/ '[]'$ Refused Recommended Disposition /'[]'$ [facility]/ '[]'$  Follow-up with PCP ?Additional Notes:   ?Reason for Disposition ? [1] MILD or MODERATE vomiting AND [2] present > 48 hours (2 days) (Exception: mild vomiting with associated diarrhea) ? ?Answer Assessment - Initial Assessment Questions ?1. VOMITING SEVERITY: "How many times have you vomited in the past 24 hours?"  ?   - MILD:  1 - 2 times/day ?   - MODERATE: 3 - 5 times/day, decreased oral intake without significant weight loss or symptoms of dehydration ?   - SEVERE: 6 or more times/day, vomits everything or nearly everything, with significant weight loss, symptoms of dehydration  ?    3 ?2. ONSET: "When did the vomiting begin?"  ?    Yesterday ?3. FLUIDS: "What fluids or food have you vomited up today?" "Have you been able to keep any fluids down?" ?    Yes ?4. ABDOMINAL PAIN: "Are your having any abdominal pain?" If yes : "How bad is it and what does it feel like?" (e.g., crampy, dull, intermittent, constant)  ?    No ?5. DIARRHEA: "Is there any diarrhea?" If Yes, ask: "How many times today?"  ?    No ?6. CONTACTS: "Is there anyone else in the family with the same symptoms?"  ?    No ?7. CAUSE: "What do you think is causing your vomiting?" ?    Unsure ?8. HYDRATION STATUS: "Any signs of dehydration?" (e.g., dry mouth [not only dry lips], too weak to stand) "When did you last urinate?" ?    No ?9. OTHER SYMPTOMS: "Do you have any other symptoms?" (e.g.,  fever, headache, vertigo, vomiting blood or coffee grounds, recent head injury) ?    None ?10. PREGNANCY: "Is there any chance you are pregnant?" "When was your last menstrual period?" ?      N/a ? ?Protocols used: Vomiting-A-AH ? ?

## 2022-01-11 NOTE — Progress Notes (Signed)
? ?Acute Office Visit ? ?Subjective:  ? ? Patient ID: Cory Rivas, male    DOB: 1966/02/09, 56 y.o.   MRN: [id number] ? ?Chief Complaint  ?Patient presents with  ? Emesis  ?  For the past 2 days  ? ? ?HPI ?Patient is in today for vomiting x started last night. Felt like he had to burp and ended up vomiting a large amount of yellow stomach acid. Had eaten peaches earlier that morning but nothing else. 2-3 hours later happened again and it happened twice last night and twice this morning so far. Last time about 3 hours ago. No associated nausea, abdominal pain or fevers. No diarrhea, constipation or blood in stools. Didn't eat anything abnormal, hasn't eaten today yet but this is normal for him. No sick contacts or recent travel.  Did have diverticulitis about 1 month ago and was treated with antibiotics at that time, symptoms resolved. CT A/P 2/23 showing mild acute uncomplicated LLQ diverticulitis.  ? ?Past Medical History:  ?Diagnosis Date  ? Bulging of cervical intervertebral disc   ? Diabetes mellitus without complication ([place])   ? Gout   ? High cholesterol   ? Hypertension   ? Stress due to family tension 04/15/2019  ? ? ?Past Surgical History:  ?Procedure Laterality Date  ? CHOLECYSTECTOMY    ? COLONOSCOPY WITH PROPOFOL N/A 03/10/2020  ? Procedure: COLONOSCOPY WITH PROPOFOL;  Surgeon: Lucilla Lame, MD;  Location: [facility] ENDOSCOPY;  Service: Endoscopy;  Laterality: N/A;  ? ? ?Family History  ?Problem Relation Age of Onset  ? Heart disease Mother   ? Diabetes Mother   ? Hyperlipidemia Mother   ? Hypertension Mother   ? Heart attack Mother   ? Lung cancer Father   ? Hypertension Father   ? Stroke Father   ? AAA (abdominal aortic aneurysm) Maternal Grandmother   ? Heart attack Maternal Grandmother   ? Diabetes Maternal Grandfather   ? ? ?Social History  ? ?Socioeconomic History  ? Marital status: Divorced  ?  Spouse name: Not on file  ? Number of children: 0  ? Years of education: 65  ? Highest education level: 9th  grade  ?Occupational History  ? Occupation: disability  ?Tobacco Use  ? Smoking status: Every Day  ?  Packs/day: 1.00  ?  Years: 37.00  ?  Pack years: 37.00  ?  Types: Cigarettes  ?  Start date: 10/19/1989  ? Smokeless tobacco: Never  ? Tobacco comments:  ?  30+ years hx smoking 2 ppd, stopped smoking briefly in April 2022 for a surgery and then restarted smoking about 1/2 ppd last 4-5 months   ?Vaping Use  ? Vaping Use: Never used  ?Substance and Sexual Activity  ? Alcohol use: No  ? Drug use: No  ? Sexual activity: Not Currently  ?  Partners: Female  ?Other Topics Concern  ? Not on file  ?Social History Narrative  ? Not on file  ? ?Social Determinants of Health  ? ?Financial Resource Strain: Not on file  ?Food Insecurity: Not on file  ?Transportation Needs: Not on file  ?Physical Activity: Not on file  ?Stress: Not on file  ?Social Connections: Not on file  ?Intimate Partner Violence: Not on file  ? ? ?Outpatient Medications Prior to Visit  ?Medication Sig Dispense Refill  ? acetaminophen (TYLENOL) 325 MG tablet SMARTSIG:3 Tablet(s) By Mouth Every 8 Hours PRN    ? allopurinol (ZYLOPRIM) 100 MG tablet Take 1 tablet (100 mg  total) by mouth daily. Take with 300 mg tab (for total of 400 mg once daily) 90 tablet 3  ? allopurinol (ZYLOPRIM) 300 MG tablet Take one 300 mg tab po daily WITH 100 mg tab for total of 400 mg once daily for gout 90 tablet 3  ? amLODipine (NORVASC) 10 MG tablet Take 1 tablet (10 mg total) by mouth daily. 90 tablet 3  ? Continuous Blood Gluc Receiver (FREESTYLE LIBRE 2 READER) DEVI     ? Continuous Blood Gluc Sensor (FREESTYLE LIBRE 2 SENSOR) MISC     ? Continuous Blood Gluc Transmit (DEXCOM G6 TRANSMITTER) MISC Use 1 each every 3 (three) months    ? Dulaglutide 1.5 MG/0.5ML SOPN Inject into the skin.    ? DULoxetine (CYMBALTA) 30 MG capsule Take 1 capsule (30 mg total) by mouth 2 (two) times daily. 180 capsule 1  ? ezetimibe (ZETIA) 10 MG tablet Take 1 tablet (10 mg total) by mouth daily. 90  tablet 1  ? ibuprofen (ADVIL) 200 MG tablet Take by mouth.    ? indomethacin (INDOCIN) 25 MG capsule TAKE 2 TABLETS (50 MG) BY 3 TIMES DAILY FOR 2 DAYS AT ONSET OF GOUT FLARE UP, THEN TAKE 1(25MG)TABLET BY MOUTH FOR 3 DAYS 20 capsule 3  ? insulin lispro (HUMALOG) 100 UNIT/ML KwikPen By correction scale based on pre-meal blood sugar. 2 units/ 50 over 150. TDD up to 45 units    ? lisinopril (ZESTRIL) 40 MG tablet Take 1 tablet (40 mg total) by mouth daily. 90 tablet 0  ? metFORMIN (GLUCOPHAGE) 1000 MG tablet TAKE 1 TABLET BY MOUTH TWICE A DAY WITH A MEAL 180 tablet 3  ? methocarbamol (ROBAXIN) 500 MG tablet Take 500 mg by mouth 2 (two) times daily.    ? pregabalin (LYRICA) 25 MG capsule TAKE 1 CAPSULE BY MOUTH IN THE MORNING AND 1 TO 3 CAPSULES EVERY NIGHT FOR CERVICAL RADICULOPATHY AND NEUROPATHY 270 capsule 2  ? rosuvastatin (CRESTOR) 40 MG tablet Take 1 tablet (40 mg total) by mouth daily. 90 tablet 3  ? triamcinolone cream (KENALOG) 0.1 % Apply 1 application topically 2 (two) times daily. 30 g 0  ? insulin glargine (LANTUS SOLOSTAR) 100 UNIT/ML Solostar Pen Inject into the skin.    ? ?No facility-administered medications prior to visit.  ? ? ?No Known Allergies ? ?Review of Systems  ?Constitutional:  Negative for chills and fever.  ?Respiratory:  Positive for cough.   ?Cardiovascular:  Negative for chest pain.  ?Gastrointestinal:  Positive for vomiting. Negative for abdominal distention, abdominal pain, blood in stool, constipation, diarrhea and nausea.  ?Genitourinary:  Negative for dysuria and hematuria.  ?Skin:  Positive for rash.  ? ?   ?Objective:  ?  ?Physical Exam ?Constitutional:   ?   Appearance: Normal appearance.  ?HENT:  ?   Head: Normocephalic and atraumatic.  ?Eyes:  ?   Conjunctiva/sclera: Conjunctivae normal.  ?Cardiovascular:  ?   Rate and Rhythm: Normal rate and regular rhythm.  ?Pulmonary:  ?   Effort: Pulmonary effort is normal.  ?   Breath sounds: Normal breath sounds.  ?Abdominal:  ?    General: Bowel sounds are normal. There is no distension.  ?   Palpations: Abdomen is soft.  ?   Tenderness: There is no abdominal tenderness. There is no right CVA tenderness, left CVA tenderness, guarding or rebound.  ?   Comments: Presumed scar tissue palpated in RUQ - hx of cholecystectomy    ?Musculoskeletal:  ?  Right lower leg: No edema.  ?   Left lower leg: No edema.  ?Skin: ?   General: Skin is warm and dry.  ?   Comments: Fine macular erythematous rash on central chest wall  ?Neurological:  ?   General: No focal deficit present.  ?   Mental Status: He is alert. Mental status is at baseline.  ?Psychiatric:     ?   Mood and Affect: Mood normal.     ?   Behavior: Behavior normal.  ? ? ?BP 136/82   Pulse 82   Temp 98.2 ?F (36.8 ?C)   Resp 18   Ht _0  (1.702 m)   Wt 241 lb 1.6 oz (109.4 kg)   SpO2 96%   BMI 37.76 kg/m?  ?Wt Readings from Last 3 Encounters:  ?01/11/22 241 lb 1.6 oz (109.4 kg)  ?12/07/21 243 lb 8 oz (110.5 kg)  ?12/04/21 248 lb 4.8 oz (112.6 kg)  ? ? ?Health Maintenance Due  ?Topic Date Due  ? COVID-19 Vaccine (4 - Booster for Pfizer series) 09/07/2020  ? OPHTHALMOLOGY EXAM  10/28/2021  ? ? ?There are no preventive care reminders to display for this patient. ? ? ?Lab Results  ?Component Value Date  ? TSH 1.86 12/18/2017  ? ?Lab Results  ?Component Value Date  ? WBC 9.4 12/07/2021  ? HGB 15.2 12/07/2021  ? HCT 45.4 12/07/2021  ? MCV 84.4 12/07/2021  ? PLT 250 12/07/2021  ? ?Lab Results  ?Component Value Date  ? NA 137 12/07/2021  ? K 4.2 12/07/2021  ? CO2 29 12/07/2021  ? GLUCOSE 211 (H) 12/07/2021  ? BUN 15 12/07/2021  ? CREATININE 1.41 (H) 12/07/2021  ? BILITOT 0.6 12/07/2021  ? ALKPHOS 71 07/03/2021  ? AST 15 12/07/2021  ? ALT 17 12/07/2021  ? PROT 6.8 12/07/2021  ? ALBUMIN 3.6 07/03/2021  ? CALCIUM 8.7 12/07/2021  ? ANIONGAP 8 07/03/2021  ? EGFR 72 08/18/2021  ? ?Lab Results  ?Component Value Date  ? CHOL 248 (H) 12/04/2021  ? ?Lab Results  ?Component Value Date  ? HDL 37 (L)  12/04/2021  ? ?Lab Results  ?Component Value Date  ? LDLCALC 166 (H) 12/04/2021  ? ?Lab Results  ?Component Value Date  ? TRIG 274 (H) 12/04/2021  ? ?Lab Results  ?Component Value Date  ? CHOLHDL 6.7 (H) 12/04/2021  ? ?Lab Resul

## 2022-01-16 ENCOUNTER — Ambulatory Visit: Payer: Medicaid Other | Attending: Physician Assistant | Admitting: Physical Therapy

## 2022-01-16 ENCOUNTER — Encounter: Payer: Self-pay | Admitting: Physical Therapy

## 2022-01-16 DIAGNOSIS — R293 Abnormal posture: Secondary | ICD-10-CM | POA: Diagnosis not present

## 2022-01-16 DIAGNOSIS — M5412 Radiculopathy, cervical region: Secondary | ICD-10-CM | POA: Insufficient documentation

## 2022-01-16 DIAGNOSIS — M542 Cervicalgia: Secondary | ICD-10-CM | POA: Insufficient documentation

## 2022-01-16 DIAGNOSIS — M62838 Other muscle spasm: Secondary | ICD-10-CM | POA: Insufficient documentation

## 2022-01-16 DIAGNOSIS — M6281 Muscle weakness (generalized): Secondary | ICD-10-CM | POA: Insufficient documentation

## 2022-01-16 NOTE — Therapy (Signed)
?OUTPATIENT PHYSICAL THERAPY TREATMENT NOTE ? ? ?Patient Name: Cory Rivas ?MRN: [id number] ?DOB:10-24-1965, 56 y.o., male ?Today's Date: 01/16/2022 ? ?PCP: Delsa Grana, PA-C ?REFERRING PROVIDER: Hazeline Junker, PA ? ? PT End of Session - 01/16/22 1127   ? ? Visit Number 7   ? Number of Visits 24   ? Date for PT Re-Evaluation 03/13/22   ? Authorization Type [place] MEDICAID HEALTHY BLUE reporting period from 12/19/2021   ? Authorization Time Period HB [id number] 3/14-4/20 12 PT visits   ? Authorization - Visit Number 6   ? Authorization - Number of Visits 12   ? Progress Note Due on Visit 10   ? PT Start Time 1122   ? PT Stop Time 4287   ? PT Time Calculation (min) 52 min   ? Activity Tolerance Patient tolerated treatment well   ? Behavior During Therapy [facility] for tasks assessed/performed   ? ?  ?  ? ?  ? ? ? ? ?Past Medical History:  ?Diagnosis Date  ? Bulging of cervical intervertebral disc   ? Diabetes mellitus without complication ([place])   ? Gout   ? High cholesterol   ? Hypertension   ? Stress due to family tension 04/15/2019  ? ?Past Surgical History:  ?Procedure Laterality Date  ? CHOLECYSTECTOMY    ? COLONOSCOPY WITH PROPOFOL N/A 03/10/2020  ? Procedure: COLONOSCOPY WITH PROPOFOL;  Surgeon: Lucilla Lame, MD;  Location: [facility] ENDOSCOPY;  Service: Endoscopy;  Laterality: N/A;  ? ?Patient Active Problem List  ? Diagnosis Date Noted  ? S/P cervical spinal fusion 08/18/2021  ? Former heavy tobacco smoker 08/18/2021  ? Gout 08/18/2021  ? Other chronic pain 10/26/2020  ? Systolic murmur 68/08/5725  ? Tobacco use 10/26/2020  ? Polyp of ascending colon   ? Rectal polyp   ? Class 2 severe obesity with serious comorbidity and body mass index (BMI) of 39.0 to 39.9 in adult [facility]) 04/15/2019  ? Chronic kidney disease (CKD) stage G2/A3, mildly decreased glomerular filtration rate (GFR) between 60-89 mL/min/1.73 square meter and albuminuria creatinine ratio greater than 300 mg/g 04/15/2019  ? Proteinuria 04/15/2019  ? Hyperlipidemia  associated with type 2 diabetes mellitus ([place]) 03/18/2019  ? Diabetes mellitus ([place]) 03/18/2019  ? Idiopathic chronic gout of multiple sites without tophus 03/18/2019  ? Essential hypertension 03/18/2019  ? Dermatitis 03/18/2019  ? Lumbar degenerative disc disease 11/22/2015  ? Cervical myelopathy ([place]) 11/22/2015  ? Osteoarthritis of spine with radiculopathy, lumbar region 11/22/2015  ? ? ?REFERRING DIAG: s/p cervical spine fusion, cervical myelopathy ? ?THERAPY DIAG:  ?Cervicalgia ? ?Radiculopathy, cervical region ? ?Muscle weakness (generalized) ? ?Other muscle spasm ? ?Abnormal posture ? ?PERTINENT HISTORY: Patient is a 56 y.o. male who presents to outpatient physical therapy with a referral for medical diagnosis cervical spine fusion, cervical myelopathy. This patient's chief complaints consist of tightness, pain, restricted ROM at the cervical spine and back of head and B UE pain/paresthesia leading to the following functional deficits: difficulty with physical activities, prolonged sitting or standing, using B UE, picking things up, reaching, yardwork, using tractor, driving, riding in vehicle, turning his head, viewing his surroundings, traveling, rolling over at night, sleeping on his side, dressing, ADLs, IADLs. Relevant past medical history and comorbidities include cervical myelopathy leading to C2-6 laminectomy and C2-T2 fusion performed 02/01/2021, HTN, DMII, hyperlipidemia, lumbar degenerative disc disease, current smoker, obesity, systolic murmur, chronic kidney disease, cervical myelopathy, gout, diverticulitis. Patient does not read/write well and needs assistance with completing paperwork. ? ?  PRECAUTIONS: don't lift over a certain amount of weight (unsure but thinks maybe 10#?), bending over, picking heavy stuff up (he does not know if/when these precautions expire. He was told not to bend over or pick up anything heavy. ? ? ?SUBJECTIVE: Patient reports he is feeling well today and reports no pain  upon arrival. States his neck is feeling better and there has been no change in his arms. He states he can turn his head better to the right. Last week he missed PT because he had reflux and trouble with his blood glucose. That is better now. He is going to the dentist tomorrow to get all of his teeth pulled so he can wear dentures. He is unable to come to PT on Thursday because he needs to take his relative to the doctor.  ? ?PAIN:  ?Are you having pain? No ? ? ?TODAY'S TREATMENT:  ? ?Therapeutic exercise: to centralize symptoms and improve ROM, strength, muscular endurance, and activity tolerance required for successful completion of functional activities.  ?- NuStep level 6 using bilateral upper and lower extremities. Seat/handle setting 7/6. For improved extremity mobility, muscular endurance, and activity tolerance; and to induce the analgesic effect of aerobic exercise, stimulate improved joint nutrition, and prepare body structures and systems for following interventions. x 6  minutes. Average SPM = 79. ?- seated scapular shrugs holding 6#DB in each hand, focusing on completely relaxing muscle at end of eccentric phase. 3x10 ?- seated row at [place] with wide grip, chest braced against seat, 3x12 with 10#. Pillow folded over at chest.  ?- seated (pillow folded at chest) lat pull at [facility] machine, 3x10 with 20# ?- seated chest press with plus at [facility] machine, 3x10 with 5#.  ? ?  ?Manual therapy: to reduce pain and tissue tension, improve range of motion, neuromodulation, in order to promote improved ability to complete functional activities. ?PRONE ?- CPA to thoracic spine from ~ T3-T10, ~20 reps at each level, grade III-IV  ? ?HOOKLYING with feet elevated ?- STM to posterior cervical spine musculature focusing on B suboccipitals. ?- PROM rotation with overpressure/oscillations as tolerated each direction focusing on upper cervical spine. 1x10 each side with 10 second hold/oscillations.   ? ? ?Pt  required multimodal cuing for proper technique and to facilitate improved neuromuscular control, strength, range of motion, and functional ability resulting in improved performance and form. ?  ?  ?HOME EXERCISE PROGRAM: ?Access Code: [email or web address] ?URL: [email or web address] ?Date: 12/26/2021 ?Prepared by: Rosita Kea ?  ?Exercises ?Seated Assisted Cervical Rotation with Towel - 2 x daily - 2 sets - 10 reps - 5 seconds hold ?Standing Median Nerve Glide - 2 x daily - 1 sets - 15 reps ?  ?PATIENT EDUCATION: ?Education details: Exercise purpose/form. ?Person educated: Patient ?Education method: Explanation, Demonstration, Tactile cues, and Verbal cues ?Education comprehension: verbalized understanding, returned demonstration, verbal cues required, tactile cues required, and needs further education ? ? ? PT Short Term Goals   ? ?  ? PT SHORT TERM GOAL #1  ? Title Be independent with initial home exercise program for self-management of symptoms.   ? Baseline Initial HEP to be provided visit 2 as appropriate (12/19/2021);   ? Time 2   ? Period Weeks   ? Status Achieved   ? Target Date 01/02/22   ? ?  ?  ? ?  ? ? ? PT Long Term Goals - 12/19/21 1112   ? TARGET DATE FOR ALL LONG TERM GOALS: 03/13/2022 ?  ?  PT LONG TERM GOAL #1  ? Title Be independent with a long-term home exercise program for self-management of symptoms.   ? Baseline Initial HEP to be provided visit 2 as appropriate (12/19/2021);   ? Time 12   ? Period Weeks   ? Status Progressing  ?  ? PT LONG TERM GOAL #2  ? Title Demonstrate improved FOTO score by 10 units to demonstrate improvement in overall condition and self-reported functional ability.   ? Baseline to be measured at visit 2 as appropriate, patient needs assistance (11/21/2021); 38 at visit#2 (12/26/2021); 37 at visit#5 (01/04/2022);   ? Time 12   ? Period Weeks   ? Status On-going  ?  ? PT LONG TERM GOAL #3  ? Title Pt will increase UE strength of by at least 1/2 MMT grade in order to demonstrate  improvement in strength and function.   ? Baseline ranges from 3/5 to 4+/5 - see objective exam (12/19/2021);   ? Time 12   ? Period Weeks   ? Status On-going  ?  ? PT LONG TERM GOAL #4  ? Title Improve cervi

## 2022-01-18 ENCOUNTER — Ambulatory Visit: Payer: Medicaid Other | Admitting: Physical Therapy

## 2022-01-22 ENCOUNTER — Ambulatory Visit: Payer: Medicaid Other | Admitting: Internal Medicine

## 2022-01-22 ENCOUNTER — Encounter: Payer: Self-pay | Admitting: Internal Medicine

## 2022-01-22 VITALS — BP 138/86 | HR 84 | Temp 98.1°F | Resp 18 | Ht 67.0 in | Wt 243.8 lb

## 2022-01-22 DIAGNOSIS — R112 Nausea with vomiting, unspecified: Secondary | ICD-10-CM | POA: Diagnosis not present

## 2022-01-22 NOTE — Patient Instructions (Addendum)
It was great seeing you today! ? ?Plan discussed at today's visit: ?-Stop Zofran  ?-Can cut Prilosec for acid reflux in half (20 mg). If symptoms get worse, can take an whole pill ? ? ?Follow up in: already scheduled  ? ?Take care and let us know if you have any questions or concerns prior to your next visit. ? ?Dr. Rosana Berger ? ?

## 2022-01-22 NOTE — Progress Notes (Signed)
? ?Acute Office Visit ? ?Subjective:  ? ? Patient ID: Cory Rivas, male    DOB: 31-Mar-1966, 56 y.o.   MRN: [id number] ? ?Chief Complaint  ?Patient presents with  ? Follow-up  ? ? ?HPI ?Patient is in today for follow up on vomiting and GERD. He was seen in the office here 2 weeks ago and was treated with Zofran and Protonix. Today he states that his symptoms have resolved. He ended up having a gout flare in the left great toe the day after our last visit. He took some Indomethacin and his gout resolved as did his nausea. He did start the Prilosec as well and has been compliant with this medication. Overall he is much better and denies abdominal pain, nausea, vomiting, bloating, belching or changes in stools, no fevers, appetite good.  ? ?Past Medical History:  ?Diagnosis Date  ? Bulging of cervical intervertebral disc   ? Diabetes mellitus without complication ([place])   ? Gout   ? High cholesterol   ? Hypertension   ? Stress due to family tension 04/15/2019  ? ? ?Past Surgical History:  ?Procedure Laterality Date  ? CHOLECYSTECTOMY    ? COLONOSCOPY WITH PROPOFOL N/A 03/10/2020  ? Procedure: COLONOSCOPY WITH PROPOFOL;  Surgeon: Lucilla Lame, MD;  Location: [facility] ENDOSCOPY;  Service: Endoscopy;  Laterality: N/A;  ? ? ?Family History  ?Problem Relation Age of Onset  ? Heart disease Mother   ? Diabetes Mother   ? Hyperlipidemia Mother   ? Hypertension Mother   ? Heart attack Mother   ? Lung cancer Father   ? Hypertension Father   ? Stroke Father   ? AAA (abdominal aortic aneurysm) Maternal Grandmother   ? Heart attack Maternal Grandmother   ? Diabetes Maternal Grandfather   ? ? ?Social History  ? ?Socioeconomic History  ? Marital status: Divorced  ?  Spouse name: Not on file  ? Number of children: 0  ? Years of education: 69  ? Highest education level: 9th grade  ?Occupational History  ? Occupation: disability  ?Tobacco Use  ? Smoking status: Every Day  ?  Packs/day: 1.00  ?  Years: 37.00  ?  Pack years: 37.00  ?  Types:  Cigarettes  ?  Start date: 10/19/1989  ? Smokeless tobacco: Never  ? Tobacco comments:  ?  30+ years hx smoking 2 ppd, stopped smoking briefly in April 2022 for a surgery and then restarted smoking about 1/2 ppd last 4-5 months   ?Vaping Use  ? Vaping Use: Never used  ?Substance and Sexual Activity  ? Alcohol use: No  ? Drug use: No  ? Sexual activity: Not Currently  ?  Partners: Female  ?Other Topics Concern  ? Not on file  ?Social History Narrative  ? Not on file  ? ?Social Determinants of Health  ? ?Financial Resource Strain: Not on file  ?Food Insecurity: Not on file  ?Transportation Needs: Not on file  ?Physical Activity: Not on file  ?Stress: Not on file  ?Social Connections: Not on file  ?Intimate Partner Violence: Not on file  ? ? ?Outpatient Medications Prior to Visit  ?Medication Sig Dispense Refill  ? acetaminophen (TYLENOL) 325 MG tablet SMARTSIG:3 Tablet(s) By Mouth Every 8 Hours PRN    ? allopurinol (ZYLOPRIM) 100 MG tablet Take 1 tablet (100 mg total) by mouth daily. Take with 300 mg tab (for total of 400 mg once daily) 90 tablet 3  ? allopurinol (ZYLOPRIM) 300 MG tablet Take  one 300 mg tab po daily WITH 100 mg tab for total of 400 mg once daily for gout 90 tablet 3  ? amLODipine (NORVASC) 10 MG tablet Take 1 tablet (10 mg total) by mouth daily. 90 tablet 3  ? Continuous Blood Gluc Receiver (FREESTYLE LIBRE 2 READER) DEVI     ? Continuous Blood Gluc Sensor (FREESTYLE LIBRE 2 SENSOR) MISC     ? Continuous Blood Gluc Transmit (DEXCOM G6 TRANSMITTER) MISC Use 1 each every 3 (three) months    ? Dulaglutide 1.5 MG/0.5ML SOPN Inject into the skin.    ? DULoxetine (CYMBALTA) 30 MG capsule Take 1 capsule (30 mg total) by mouth 2 (two) times daily. 180 capsule 1  ? ezetimibe (ZETIA) 10 MG tablet Take 1 tablet (10 mg total) by mouth daily. 90 tablet 1  ? ibuprofen (ADVIL) 200 MG tablet Take by mouth.    ? indomethacin (INDOCIN) 25 MG capsule TAKE 2 TABLETS (50 MG) BY 3 TIMES DAILY FOR 2 DAYS AT ONSET OF GOUT  FLARE UP, THEN TAKE 1($RemoveBef'25MG'[id number]$ )TABLET BY MOUTH FOR 3 DAYS 20 capsule 3  ? insulin glargine (LANTUS SOLOSTAR) 100 UNIT/ML Solostar Pen Inject into the skin.    ? insulin lispro (HUMALOG) 100 UNIT/ML KwikPen By correction scale based on pre-meal blood sugar. 2 units/ 50 over 150. TDD up to 45 units    ? lisinopril (ZESTRIL) 40 MG tablet Take 1 tablet (40 mg total) by mouth daily. 90 tablet 0  ? metFORMIN (GLUCOPHAGE) 1000 MG tablet TAKE 1 TABLET BY MOUTH TWICE A DAY WITH A MEAL 180 tablet 3  ? methocarbamol (ROBAXIN) 500 MG tablet Take 500 mg by mouth 2 (two) times daily.    ? omeprazole (PRILOSEC) 40 MG capsule Take 1 capsule (40 mg total) by mouth daily. 30 capsule 3  ? ondansetron (ZOFRAN-ODT) 8 MG disintegrating tablet Take 1 tablet (8 mg total) by mouth every 8 (eight) hours as needed for nausea or vomiting. 20 tablet 0  ? pregabalin (LYRICA) 25 MG capsule TAKE 1 CAPSULE BY MOUTH IN THE MORNING AND 1 TO 3 CAPSULES EVERY NIGHT FOR CERVICAL RADICULOPATHY AND NEUROPATHY 270 capsule 2  ? rosuvastatin (CRESTOR) 40 MG tablet Take 1 tablet (40 mg total) by mouth daily. 90 tablet 3  ? triamcinolone ointment (KENALOG) 0.5 % Apply 1 application. topically 2 (two) times daily. 30 g 0  ? ?No facility-administered medications prior to visit.  ? ? ?No Known Allergies ? ?Review of Systems  ?Constitutional:  Negative for appetite change, chills and fever.  ?Gastrointestinal:  Negative for abdominal distention, abdominal pain, blood in stool, constipation, diarrhea and nausea.  ?Genitourinary:  Negative for dysuria and hematuria.  ? ?   ?Objective:  ?  ?Physical Exam ?Constitutional:   ?   Appearance: Normal appearance.  ?HENT:  ?   Head: Normocephalic and atraumatic.  ?Eyes:  ?   Conjunctiva/sclera: Conjunctivae normal.  ?Cardiovascular:  ?   Rate and Rhythm: Normal rate and regular rhythm.  ?Pulmonary:  ?   Effort: Pulmonary effort is normal.  ?   Breath sounds: Normal breath sounds.  ?Abdominal:  ?   General: There is no  distension.  ?   Palpations: Abdomen is soft.  ?   Tenderness: There is no abdominal tenderness. There is no right CVA tenderness, left CVA tenderness, guarding or rebound.  ?   Comments: Presumed scar tissue palpated in RUQ - hx of cholecystectomy    ?Musculoskeletal:  ?   Right lower leg:  No edema.  ?   Left lower leg: No edema.  ?Skin: ?   General: Skin is warm and dry.  ?   Comments: Fine macular erythematous rash on central chest wall  ?Neurological:  ?   General: No focal deficit present.  ?   Mental Status: He is alert. Mental status is at baseline.  ?Psychiatric:     ?   Mood and Affect: Mood normal.     ?   Behavior: Behavior normal.  ? ? ?BP 138/86   Pulse 84   Temp 98.1 ?F (36.7 ?C)   Resp 18   Ht $R'5\' 7"'ou$  (1.702 m)   Wt 243 lb 12.8 oz (110.6 kg)   SpO2 97%   BMI 38.18 kg/m?  ?Wt Readings from Last 3 Encounters:  ?01/11/22 241 lb 1.6 oz (109.4 kg)  ?12/07/21 243 lb 8 oz (110.5 kg)  ?12/04/21 248 lb 4.8 oz (112.6 kg)  ? ? ?Health Maintenance Due  ?Topic Date Due  ? COVID-19 Vaccine (4 - Booster for Pfizer series) 09/07/2020  ? OPHTHALMOLOGY EXAM  10/28/2021  ? ? ?There are no preventive care reminders to display for this patient. ? ? ?Lab Results  ?Component Value Date  ? TSH 1.86 12/18/2017  ? ?Lab Results  ?Component Value Date  ? WBC 9.4 12/07/2021  ? HGB 15.2 12/07/2021  ? HCT 45.4 12/07/2021  ? MCV 84.4 12/07/2021  ? PLT 250 12/07/2021  ? ?Lab Results  ?Component Value Date  ? NA 137 12/07/2021  ? K 4.2 12/07/2021  ? CO2 29 12/07/2021  ? GLUCOSE 211 (H) 12/07/2021  ? BUN 15 12/07/2021  ? CREATININE 1.41 (H) 12/07/2021  ? BILITOT 0.6 12/07/2021  ? ALKPHOS 71 07/03/2021  ? AST 15 12/07/2021  ? ALT 17 12/07/2021  ? PROT 6.8 12/07/2021  ? ALBUMIN 3.6 07/03/2021  ? CALCIUM 8.7 12/07/2021  ? ANIONGAP 8 07/03/2021  ? EGFR 72 08/18/2021  ? ?Lab Results  ?Component Value Date  ? CHOL 248 (H) 12/04/2021  ? ?Lab Results  ?Component Value Date  ? HDL 37 (L) 12/04/2021  ? ?Lab Results  ?Component Value Date  ?  LDLCALC 166 (H) 12/04/2021  ? ?Lab Results  ?Component Value Date  ? TRIG 274 (H) 12/04/2021  ? ?Lab Results  ?Component Value Date  ? CHOLHDL 6.7 (H) 12/04/2021  ? ?Lab Results  ?Component Value Date  ? HGBA1C 6.6 (H) 02/2

## 2022-01-23 ENCOUNTER — Encounter: Payer: Self-pay | Admitting: Physical Therapy

## 2022-01-23 ENCOUNTER — Ambulatory Visit: Payer: Medicaid Other | Admitting: Physical Therapy

## 2022-01-23 DIAGNOSIS — R293 Abnormal posture: Secondary | ICD-10-CM

## 2022-01-23 DIAGNOSIS — M542 Cervicalgia: Secondary | ICD-10-CM | POA: Diagnosis not present

## 2022-01-23 DIAGNOSIS — M5412 Radiculopathy, cervical region: Secondary | ICD-10-CM

## 2022-01-23 DIAGNOSIS — M62838 Other muscle spasm: Secondary | ICD-10-CM | POA: Diagnosis not present

## 2022-01-23 DIAGNOSIS — M6281 Muscle weakness (generalized): Secondary | ICD-10-CM | POA: Diagnosis not present

## 2022-01-23 NOTE — Therapy (Signed)
?OUTPATIENT PHYSICAL THERAPY TREATMENT NOTE ? ? ?Patient Name: Cory Rivas ?MRN: [id number] ?DOB:08/04/1966, 56 y.o., male ?Today's Date: 01/23/2022 ? ?PCP: Delsa Grana, PA-C ?REFERRING PROVIDER: Hazeline Junker, PA ? ? PT End of Session - 01/23/22 1515   ? ? Visit Number 8   ? Number of Visits 24   ? Date for PT Re-Evaluation 03/13/22   ? Authorization Type [place] MEDICAID HE[employer]Y BLUE reporting period from 12/19/2021   ? Authorization Time Period HB [id number] 3/14-4/20 12 PT visits   ? Authorization - Visit Number 7   ? Authorization - Number of Visits 12   ? Progress Note Due on Visit 10   ? PT Start Time 6948   ? PT Stop Time 1556   ? PT Time Calculation (min) 38 min   ? Activity Tolerance Patient tolerated treatment well   ? Behavior During Therapy [facility] for tasks assessed/performed   ? ?  ?  ? ?  ? ? ? ? ? ?Past Medical History:  ?Diagnosis Date  ? Bulging of cervical intervertebral disc   ? Diabetes mellitus without complication ([place])   ? Gout   ? High cholesterol   ? Hypertension   ? Stress due to family tension 04/15/2019  ? ?Past Surgical History:  ?Procedure Laterality Date  ? CHOLECYSTECTOMY    ? COLONOSCOPY WITH PROPOFOL N/A 03/10/2020  ? Procedure: COLONOSCOPY WITH PROPOFOL;  Surgeon: Lucilla Lame, MD;  Location: [facility] ENDOSCOPY;  Service: Endoscopy;  Laterality: N/A;  ? ?Patient Active Problem List  ? Diagnosis Date Noted  ? S/P cervical spinal fusion 08/18/2021  ? Former heavy tobacco smoker 08/18/2021  ? Gout 08/18/2021  ? Other chronic pain 10/26/2020  ? Systolic murmur 54/62/7035  ? Tobacco use 10/26/2020  ? Polyp of ascending colon   ? Rectal polyp   ? Class 2 severe obesity with serious comorbidity and body mass index (BMI) of 39.0 to 39.9 in adult [facility]) 04/15/2019  ? Chronic kidney disease (CKD) stage G2/A3, mildly decreased glomerular filtration rate (GFR) between 60-89 mL/min/1.73 square meter and albuminuria creatinine ratio greater than 300 mg/g 04/15/2019  ? Proteinuria 04/15/2019  ?  Hyperlipidemia associated with type 2 diabetes mellitus ([place]) 03/18/2019  ? Diabetes mellitus ([place]) 03/18/2019  ? Idiopathic chronic gout of multiple sites without tophus 03/18/2019  ? Essential hypertension 03/18/2019  ? Dermatitis 03/18/2019  ? Lumbar degenerative disc disease 11/22/2015  ? Cervical myelopathy ([place]) 11/22/2015  ? Osteoarthritis of spine with radiculopathy, lumbar region 11/22/2015  ? ? ?REFERRING DIAG: s/p cervical spine fusion, cervical myelopathy ? ?THERAPY DIAG:  ?Cervicalgia ? ?Radiculopathy, cervical region ? ?Muscle weakness (generalized) ? ?Other muscle spasm ? ?Abnormal posture ? ?PERTINENT HISTORY: Patient is a 56 y.o. male who presents to outpatient physical therapy with a referral for medical diagnosis cervical spine fusion, cervical myelopathy. This patient's chief complaints consist of tightness, pain, restricted ROM at the cervical spine and back of head and B UE pain/paresthesia leading to the following functional deficits: difficulty with physical activities, prolonged sitting or standing, using B UE, picking things up, reaching, yardwork, using tractor, driving, riding in vehicle, turning his head, viewing his surroundings, traveling, rolling over at night, sleeping on his side, dressing, ADLs, IADLs. Relevant past medical history and comorbidities include cervical myelopathy leading to C2-6 laminectomy and C2-T2 fusion performed 02/01/2021, HTN, DMII, hyperlipidemia, lumbar degenerative disc disease, current smoker, obesity, systolic murmur, chronic kidney disease, cervical myelopathy, gout, diverticulitis. Patient does not read/write well and needs assistance with completing  paperwork. ? ?PRECAUTIONS: don't lift over a certain amount of weight (unsure but thinks maybe 10#?), bending over, picking heavy stuff up (he does not know if/when these precautions expire. He was told not to bend over or pick up anything heavy. ? ? ?SUBJECTIVE: Patient reports he is feeling well with no  pain upon arrival. States his neck has been feeling well. States the tingling in his fingers is still there on and off. Patient states he can see a big difference since starting PT. He states he can see farther to the right and left and up. He has an easier time bending down to get in and out of the car. He can also get a gallon of milk and put it back into the shelf at head height in the refrigerator no problem now when he previously had difficulty with it.  ? ?PAIN:  ?Are you having pain? No ? ? ?TODAY'S TREATMENT:  ? ?Therapeutic exercise: to centralize symptoms and improve ROM, strength, muscular endurance, and activity tolerance required for successful completion of functional activities.  ?- NuStep level 6 using bilateral upper and lower extremities. Seat/handle setting 7/6. For improved extremity mobility, muscular endurance, and activity tolerance; and to induce the analgesic effect of aerobic exercise, stimulate improved joint nutrition, and prepare body structures and systems for following interventions. x 6  minutes. Average SPM = 79. ?- seated scapular shrugs holding 7#DB in each hand, focusing on completely relaxing muscle at end of eccentric phase. 3x10 ?- seated row at [place] with wide grip, chest braced against seat, 3x15 with 15#. Pillow folded over at chest.  ?- seated (pillow folded at chest) lat pull at [facility] machine, 3x15 with 25# ?- seated chest press with plus at [place], 3x10 with 10#.  ? ?  ?Manual therapy: to reduce pain and tissue tension, improve range of motion, neuromodulation, in order to promote improved ability to complete functional activities. ? ?HOOKLYING with feet elevated ?- STM to posterior cervical spine musculature focusing on B suboccipitals. ?- PROM rotation with overpressure/oscillations as tolerated each direction focusing on upper cervical spine. 1x10 each side with 10 second hold/oscillations.   ? ? ?Pt required multimodal cuing for proper technique and to  facilitate improved neuromuscular control, strength, range of motion, and functional ability resulting in improved performance and form. ?  ?  ?HOME EXERCISE PROGRAM: ?Access Code: [email or web address] ?URL: [email or web address] ?Date: 12/26/2021 ?Prepared by: Rosita Kea ?  ?Exercises ?Seated Assisted Cervical Rotation with Towel - 2 x daily - 2 sets - 10 reps - 5 seconds hold ?Standing Median Nerve Glide - 2 x daily - 1 sets - 15 reps ?  ?PATIENT EDUCATION: ?Education details: Exercise purpose/form. ?Person educated: Patient ?Education method: Explanation, Demonstration, Tactile cues, and Verbal cues ?Education comprehension: verbalized understanding, returned demonstration, verbal cues required, tactile cues required, and needs further education ? ? ? PT Short Term Goals   ? ?  ? PT SHORT TERM GOAL #1  ? Title Be independent with initial home exercise program for self-management of symptoms.   ? Baseline Initial HEP to be provided visit 2 as appropriate (12/19/2021);   ? Time 2   ? Period Weeks   ? Status Achieved   ? Target Date 01/02/22   ? ?  ?  ? ?  ? ? ? PT Long Term Goals - 12/19/21 1112   ? TARGET DATE FOR ALL LONG TERM GOALS: 03/13/2022 ?  ? PT LONG TERM GOAL #1  ? Title Be independent  with a long-term home exercise program for self-management of symptoms.   ? Baseline Initial HEP to be provided visit 2 as appropriate (12/19/2021);   ? Time 12   ? Period Weeks   ? Status Progressing  ?  ? PT LONG TERM GOAL #2  ? Title Demonstrate improved FOTO score by 10 units to demonstrate improvement in overall condition and self-reported functional ability.   ? Baseline to be measured at visit 2 as appropriate, patient needs assistance (11/21/2021); 38 at visit#2 (12/26/2021); 37 at visit#5 (01/04/2022);   ? Time 12   ? Period Weeks   ? Status On-going  ?  ? PT LONG TERM GOAL #3  ? Title Pt will increase UE strength of by at least 1/2 MMT grade in order to demonstrate improvement in strength and function.   ? Baseline  ranges from 3/5 to 4+/5 - see objective exam (12/19/2021);   ? Time 12   ? Period Weeks   ? Status On-going  ?  ? PT LONG TERM GOAL #4  ? Title Improve cervical AROM rotation to equal or greater than 45 degrees each dire

## 2022-01-25 ENCOUNTER — Ambulatory Visit: Payer: Medicaid Other | Admitting: Physical Therapy

## 2022-01-25 ENCOUNTER — Telehealth: Payer: Self-pay | Admitting: Physical Therapy

## 2022-01-25 NOTE — Telephone Encounter (Signed)
Called patient after he did not show up today for his appointment scheduled at 4pm. Left VM requesting call back.  ? ?Everlean Alstrom. Graylon Good, PT, DPT ?01/25/22, 4:39 PM ? ?[place] ?[address] ?[place], [place] [address] ?P: [phone] I F: [phone] ? ?

## 2022-01-26 ENCOUNTER — Other Ambulatory Visit: Payer: Self-pay

## 2022-01-26 DIAGNOSIS — I10 Essential (primary) hypertension: Secondary | ICD-10-CM

## 2022-01-26 DIAGNOSIS — E1169 Type 2 diabetes mellitus with other specified complication: Secondary | ICD-10-CM

## 2022-01-26 MED ORDER — AMLODIPINE BESYLATE 10 MG PO TABS: 10.0000 mg | ORAL_TABLET | Freq: Every day | ORAL | 3 refills | Status: DC

## 2022-01-26 MED ORDER — ROSUVASTATIN CALCIUM 40 MG PO TABS: 40.0000 mg | ORAL_TABLET | Freq: Every day | ORAL | 3 refills | Status: DC

## 2022-01-26 NOTE — Telephone Encounter (Signed)
Pt had last OV with you. Please advice ?

## 2022-01-29 ENCOUNTER — Ambulatory Visit: Payer: Medicaid Other | Admitting: Family Medicine

## 2022-01-29 ENCOUNTER — Encounter: Payer: Self-pay | Admitting: Family Medicine

## 2022-01-29 VITALS — BP 162/82 | HR 80 | Temp 97.9°F | Resp 16 | Ht 67.0 in | Wt 240.5 lb

## 2022-01-29 DIAGNOSIS — J329 Chronic sinusitis, unspecified: Secondary | ICD-10-CM | POA: Diagnosis not present

## 2022-01-29 DIAGNOSIS — H1013 Acute atopic conjunctivitis, bilateral: Secondary | ICD-10-CM | POA: Diagnosis not present

## 2022-01-29 DIAGNOSIS — I10 Essential (primary) hypertension: Secondary | ICD-10-CM

## 2022-01-29 DIAGNOSIS — J310 Chronic rhinitis: Secondary | ICD-10-CM

## 2022-01-29 MED ORDER — OLOPATADINE HCL 0.2 % OP SOLN: 1.0000 [drp] | Freq: Two times a day (BID) | OPHTHALMIC | 3 refills | Status: DC | PRN

## 2022-01-29 MED ORDER — DIPHENHYDRAMINE HCL 25 MG PO TABS: 25.0000 mg | ORAL_TABLET | Freq: Four times a day (QID) | ORAL | 0 refills | Status: DC | PRN

## 2022-01-29 MED ORDER — LEVOCETIRIZINE DIHYDROCHLORIDE 5 MG PO TABS: 5.0000 mg | ORAL_TABLET | Freq: Every evening | ORAL | 1 refills | Status: DC

## 2022-01-29 MED ORDER — MOMETASONE FUROATE 50 MCG/ACT NA SUSP: 2.0000 | Freq: Every day | NASAL | 12 refills | Status: DC

## 2022-01-29 NOTE — Patient Instructions (Signed)
Allergic Rhinitis, Adult ?Allergic rhinitis is a reaction to allergens. Allergens are things that can cause an allergic reaction. This condition affects the lining inside the nose (mucous membrane). ?There are two types of allergic rhinitis: ?Seasonal. This type is also called hay fever. It happens only during some times of the year. ?Perennial. This type can happen at any time of the year. ?This condition cannot be spread from person to person (is not contagious). It can be mild, worse, or very bad. It can develop at any age and may be outgrown. ?What are the causes? ?This condition may be caused by: ?Pollen from grasses, trees, and weeds. ?Dust mites. ?Smoke. ?Mold. ?Car fumes. ?The pee (urine), spit, or dander of pets. Dander is dead skin cells from a pet. ?What increases the risk? ?You are more likely to develop this condition if: ?You have allergies in your family. ?You have problems like allergies in your family. You may have: ?Swelling of parts of your eyes and eyelids. ?Asthma. This affects how you breathe. ?Long-term redness and swelling on your skin. ?Food allergies. ?What are the signs or symptoms? ?The main symptom of this condition is a runny or stuffy nose (nasal congestion). Other symptoms may include: ?Sneezing or coughing. ?Itching and tearing of your eyes. ?Mucus that drips down the back of your throat (postnasal drip). ?Trouble sleeping. ?Feeling tired. ?Headache. ?Sore throat. ?How is this treated? ?There is no cure for this condition. You should avoid things that you are allergic to. Treatment can help to relieve symptoms. This may include: ?Medicines that block allergy symptoms, such as corticosteroids or antihistamines. These may be given as a shot, nasal spray, or pill. ?Avoiding things you are allergic to. ?Medicines that give you bits of what you are allergic to over time. This is called immunotherapy. It is done if other treatments do not help. You may get: ?Shots. ?Medicine under your  tongue. ?Stronger medicines, if other treatments do not help. ?Follow these instructions at home: ?Avoiding allergens ?Find out what things you are allergic to and avoid them. To do this, try these things: ?If you get allergies any time of year: ?Replace carpet with wood, tile, or vinyl flooring. Carpet can trap pet dander and dust. ?Do not smoke. Do not allow smoking in your home. ?Change your heating and air conditioning filters at least once a month. ?If you get allergies only some times of the year: ?Keep windows closed when you can. ?Plan things to do outside when pollen counts are lowest. Check pollen counts before you plan things to do outside. ?When you come indoors, change your clothes and shower before you sit on furniture or bedding. ?If you are allergic to a pet: ?Keep the pet out of your bedroom. ?Vacuum, sweep, and dust often. ? ?General instructions ?Take over-the-counter and prescription medicines only as told by your doctor. ?Drink enough fluid to keep your pee (urine) pale yellow. ?Keep all follow-up visits as told by your doctor. This is important. ?Where to find more information ?American Academy of Allergy, Asthma & Immunology: www.aaaai.org ?Contact a doctor if: ?You have a fever. ?You get a cough that does not go away. ?You make whistling sounds when you breathe (wheeze). ?Your symptoms slow you down. ?Your symptoms stop you from doing your normal things each day. ?Get help right away if: ?You are short of breath. ?This symptom may be an emergency. Do not wait to see if the symptom will go away. Get medical help right away. Call your local   emergency services (911 in the [place] Do not drive yourself to the hospital. ?Summary ?Allergic rhinitis may be treated by taking medicines and avoiding things you are allergic to. ?If you have allergies only some of the year, keep windows closed when you can at those times. ?Contact your doctor if you get a fever or a cough that does not go away. ?This  information is not intended to replace advice given to you by your health care provider. Make sure you discuss any questions you have with your health care provider. ?Document Revised: 11/23/2019 Document Reviewed: 09/29/2019 ?Elsevier Patient Education ? 2023 Elsevier Inc. ? ?

## 2022-01-29 NOTE — Progress Notes (Signed)
? ? ?Patient ID: Cory Rivas, male    DOB: February 02, 1966, 56 y.o.   MRN: [id number] ? ?PCP: Delsa Grana, PA-C ? ?Chief Complaint  ?Patient presents with  ? Sinusitis  ?  Sx started on Friday- Face pressure, eyes itching/hurting, nasal congestion   ? ? ?Subjective:  ? ?Cory Rivas is a 56 y.o. male, presents to clinic with CC of the following: ? ?HPI  ?Eye itching burning profuse drainage and nasal discharge, throat irritation and cough ?Started a few days ago, eyes got worse today he tried visine drops over the weekend , this did not help very much ?He did try Benadryl and this seemed to dry him up and improved his symptoms temporarily ?He denies any severe headache, fever, sweats, chills.  Denies any recent sick contacts ?When his symptoms began he was working outside and was exposed to a lot of pollen he states he was so covered in pollen that when he showered he could see it in water, similar to help cars are currently covered by a yellow dust ?He reports this has seemed to developed seasonal allergies over the past couple years but he is not currently on any allergy medications besides the Benadryl ? ?Patient's blood pressure is elevated today he denies any chest pain, shortness of breath, headaches ?He has been dealing with a lot of irritation to his eyes nose and throat for the past 3 to 4 days ?BP Readings from Last 3 Encounters:  ?01/29/22 (!) 162/82  ?01/22/22 138/86  ?01/11/22 136/82  ? ? ? ? ?Patient Active Problem List  ? Diagnosis Date Noted  ? S/P cervical spinal fusion 08/18/2021  ? Former heavy tobacco smoker 08/18/2021  ? Gout 08/18/2021  ? Other chronic pain 10/26/2020  ? Systolic murmur 51/70/0174  ? Tobacco use 10/26/2020  ? Polyp of ascending colon   ? Rectal polyp   ? Class 2 severe obesity with serious comorbidity and body mass index (BMI) of 39.0 to 39.9 in adult [facility]) 04/15/2019  ? Chronic kidney disease (CKD) stage G2/A3, mildly decreased glomerular filtration rate (GFR) between 60-89  mL/min/1.73 square meter and albuminuria creatinine ratio greater than 300 mg/g 04/15/2019  ? Proteinuria 04/15/2019  ? Hyperlipidemia associated with type 2 diabetes mellitus ([place]) 03/18/2019  ? Diabetes mellitus ([place]) 03/18/2019  ? Idiopathic chronic gout of multiple sites without tophus 03/18/2019  ? Essential hypertension 03/18/2019  ? Dermatitis 03/18/2019  ? Lumbar degenerative disc disease 11/22/2015  ? Cervical myelopathy ([place]) 11/22/2015  ? Osteoarthritis of spine with radiculopathy, lumbar region 11/22/2015  ? ? ? ? ?Current Outpatient Medications:  ?  acetaminophen (TYLENOL) 325 MG tablet, SMARTSIG:3 Tablet(s) By Mouth Every 8 Hours PRN, Disp: , Rfl:  ?  allopurinol (ZYLOPRIM) 100 MG tablet, Take 1 tablet (100 mg total) by mouth daily. Take with 300 mg tab (for total of 400 mg once daily), Disp: 90 tablet, Rfl: 3 ?  allopurinol (ZYLOPRIM) 300 MG tablet, Take one 300 mg tab po daily WITH 100 mg tab for total of 400 mg once daily for gout, Disp: 90 tablet, Rfl: 3 ?  amLODipine (NORVASC) 10 MG tablet, Take 1 tablet (10 mg total) by mouth daily., Disp: 90 tablet, Rfl: 3 ?  Continuous Blood Gluc Receiver (FREESTYLE LIBRE 2 READER) DEVI, , Disp: , Rfl:  ?  Continuous Blood Gluc Sensor (FREESTYLE LIBRE 2 SENSOR) MISC, , Disp: , Rfl:  ?  Continuous Blood Gluc Transmit (DEXCOM G6 TRANSMITTER) MISC, Use 1 each every 3 (three)  months, Disp: , Rfl:  ?  Dulaglutide 1.5 MG/0.5ML SOPN, Inject into the skin., Disp: , Rfl:  ?  DULoxetine (CYMBALTA) 30 MG capsule, Take 1 capsule (30 mg total) by mouth 2 (two) times daily., Disp: 180 capsule, Rfl: 1 ?  ezetimibe (ZETIA) 10 MG tablet, Take 1 tablet (10 mg total) by mouth daily., Disp: 90 tablet, Rfl: 1 ?  ibuprofen (ADVIL) 200 MG tablet, Take by mouth., Disp: , Rfl:  ?  indomethacin (INDOCIN) 25 MG capsule, TAKE 2 TABLETS (50 MG) BY 3 TIMES DAILY FOR 2 DAYS AT ONSET OF GOUT FLARE UP, THEN TAKE 1('25MG'$ )TABLET BY MOUTH FOR 3 DAYS, Disp: 20 capsule, Rfl: 3 ?  insulin lispro  (HUMALOG) 100 UNIT/ML KwikPen, By correction scale based on pre-meal blood sugar. 2 units/ 50 over 150. TDD up to 45 units, Disp: , Rfl:  ?  lisinopril (ZESTRIL) 40 MG tablet, Take 1 tablet (40 mg total) by mouth daily., Disp: 90 tablet, Rfl: 0 ?  metFORMIN (GLUCOPHAGE) 1000 MG tablet, TAKE 1 TABLET BY MOUTH TWICE A DAY WITH A MEAL, Disp: 180 tablet, Rfl: 3 ?  methocarbamol (ROBAXIN) 500 MG tablet, Take 500 mg by mouth 2 (two) times daily., Disp: , Rfl:  ?  omeprazole (PRILOSEC) 40 MG capsule, Take 1 capsule (40 mg total) by mouth daily., Disp: 30 capsule, Rfl: 3 ?  ondansetron (ZOFRAN-ODT) 8 MG disintegrating tablet, Take 1 tablet (8 mg total) by mouth every 8 (eight) hours as needed for nausea or vomiting., Disp: 20 tablet, Rfl: 0 ?  pregabalin (LYRICA) 25 MG capsule, TAKE 1 CAPSULE BY MOUTH IN THE MORNING AND 1 TO 3 CAPSULES EVERY NIGHT FOR CERVICAL RADICULOPATHY AND NEUROPATHY, Disp: 270 capsule, Rfl: 2 ?  rosuvastatin (CRESTOR) 40 MG tablet, Take 1 tablet (40 mg total) by mouth daily., Disp: 90 tablet, Rfl: 3 ?  triamcinolone ointment (KENALOG) 0.5 %, Apply 1 application. topically 2 (two) times daily., Disp: 30 g, Rfl: 0 ?  insulin glargine (LANTUS SOLOSTAR) 100 UNIT/ML Solostar Pen, Inject into the skin., Disp: , Rfl:  ? ? ?No Known Allergies ? ? ?Social History  ? ?Tobacco Use  ? Smoking status: Every Day  ?  Packs/day: 1.00  ?  Years: 37.00  ?  Pack years: 37.00  ?  Types: Cigarettes  ?  Start date: 10/19/1989  ? Smokeless tobacco: Never  ? Tobacco comments:  ?  30+ years hx smoking 2 ppd, stopped smoking briefly in April 2022 for a surgery and then restarted smoking about 1/2 ppd last 4-5 months   ?Vaping Use  ? Vaping Use: Never used  ?Substance Use Topics  ? Alcohol use: No  ? Drug use: No  ?  ? ? ?Chart Review Today: ?I personally reviewed active problem list, medication list, allergies, family history, social history, health maintenance, notes from last encounter, lab results, imaging with the  patient/caregiver today. ? ? ?Review of Systems  ?Constitutional: Negative.   ?HENT: Negative.    ?Eyes: Negative.   ?Respiratory: Negative.    ?Cardiovascular: Negative.   ?Gastrointestinal: Negative.   ?Endocrine: Negative.   ?Genitourinary: Negative.   ?Musculoskeletal: Negative.   ?Skin: Negative.   ?Allergic/Immunologic: Negative.   ?Neurological: Negative.   ?Hematological: Negative.   ?Psychiatric/Behavioral: Negative.    ?All other systems reviewed and are negative. ? ?   ?Objective:  ? ?Vitals:  ? 01/29/22 1442 01/29/22 1451  ?BP: (!) 162/84 (!) 162/82  ?Pulse: 80   ?Resp: 16   ?Temp: 97.9 ?F (  36.6 ?C)   ?TempSrc: Oral   ?SpO2: 98%   ?Weight: 240 lb 8 oz (109.1 kg)   ?Height: '5\' 7"'$  (1.702 m)   ?  ?Body mass index is 37.67 kg/m?. ? ?Physical Exam ?Vitals and nursing note reviewed.  ?Constitutional:   ?   General: He is not in acute distress. ?   Appearance: Normal appearance. He is well-developed and well-groomed. He is obese. He is not ill-appearing, toxic-appearing or diaphoretic.  ?HENT:  ?   Head: Normocephalic and atraumatic.  ?   Right Ear: External ear normal. There is impacted cerumen.  ?   Left Ear: Tympanic membrane, ear canal and external ear normal. There is no impacted cerumen.  ?   Nose: Mucosal edema, congestion and rhinorrhea present.  ?   Right Nostril: No occlusion.  ?   Right Sinus: No maxillary sinus tenderness or frontal sinus tenderness.  ?   Left Sinus: No maxillary sinus tenderness or frontal sinus tenderness.  ?   Mouth/Throat:  ?   Mouth: Mucous membranes are moist.  ?   Pharynx: Oropharynx is clear. Posterior oropharyngeal erythema (very mild op injection w/o diffuse edema or erythema) present. No oropharyngeal exudate.  ?Eyes:  ?   General: Lids are normal. No allergic shiner or scleral icterus.    ?   Right eye: No discharge.     ?   Left eye: No discharge.  ?   Conjunctiva/sclera:  ?   Right eye: Right conjunctiva is injected. No exudate or hemorrhage. ?   Left eye: Left  conjunctiva is injected. No exudate or hemorrhage. ?   Pupils: Pupils are equal, round, and reactive to light.  ?Neck:  ?   Trachea: Trachea and phonation normal.  ?Cardiovascular:  ?   Rate and Rhythm: Normal rate and reg

## 2022-01-30 ENCOUNTER — Ambulatory Visit: Payer: Medicaid Other | Admitting: Physical Therapy

## 2022-01-30 ENCOUNTER — Encounter: Payer: Self-pay | Admitting: Physical Therapy

## 2022-01-30 DIAGNOSIS — M542 Cervicalgia: Secondary | ICD-10-CM

## 2022-01-30 DIAGNOSIS — R293 Abnormal posture: Secondary | ICD-10-CM

## 2022-01-30 DIAGNOSIS — M5412 Radiculopathy, cervical region: Secondary | ICD-10-CM | POA: Diagnosis not present

## 2022-01-30 DIAGNOSIS — M6281 Muscle weakness (generalized): Secondary | ICD-10-CM | POA: Diagnosis not present

## 2022-01-30 DIAGNOSIS — M62838 Other muscle spasm: Secondary | ICD-10-CM | POA: Diagnosis not present

## 2022-01-30 NOTE — Therapy (Addendum)
?OUTPATIENT PHYSICAL THERAPY TREATMENT NOTE / PROGRESS NOTE / RE-CERTIFICATION ?Dates of reporting: 12/19/2021 to 01/30/2022 ? ? ?Patient Name: Cory Rivas ?MRN: [id number] ?DOB:October 08, 1966, 56 y.o., male ?Today's Date: 01/30/2022 ? ?PCP: Delsa Grana, PA-C ?REFERRING PROVIDER: Hazeline Junker, PA ? ? PT End of Session - 01/30/22 1031   ? ? Visit Number 9   ? Number of Visits 24   ? Date for PT Re-Evaluation 04/24/22   ? Authorization Type [place] MEDICAID HEALTHY BLUE reporting period from 12/19/2021   ? Authorization Time Period HB [id number] 3/14-4/20 12 PT visits   ? Authorization - Visit Number 8   ? Authorization - Number of Visits 12   ? Progress Note Due on Visit 10   ? PT Start Time 1030   ? PT Stop Time 1117   ? PT Time Calculation (min) 47 min   ? Activity Tolerance Patient tolerated treatment well   ? Behavior During Therapy [facility] for tasks assessed/performed   ? ?  ?  ? ?  ? ? ? ? ? ? ?Past Medical History:  ?Diagnosis Date  ? Bulging of cervical intervertebral disc   ? Diabetes mellitus without complication ([place])   ? Gout   ? High cholesterol   ? Hypertension   ? Stress due to family tension 04/15/2019  ? ?Past Surgical History:  ?Procedure Laterality Date  ? CHOLECYSTECTOMY    ? COLONOSCOPY WITH PROPOFOL N/A 03/10/2020  ? Procedure: COLONOSCOPY WITH PROPOFOL;  Surgeon: Lucilla Lame, MD;  Location: [facility] ENDOSCOPY;  Service: Endoscopy;  Laterality: N/A;  ? ?Patient Active Problem List  ? Diagnosis Date Noted  ? S/P cervical spinal fusion 08/18/2021  ? Former heavy tobacco smoker 08/18/2021  ? Gout 08/18/2021  ? Other chronic pain 10/26/2020  ? Systolic murmur 29/92/4268  ? Tobacco use 10/26/2020  ? Polyp of ascending colon   ? Rectal polyp   ? Class 2 severe obesity with serious comorbidity and body mass index (BMI) of 39.0 to 39.9 in adult [facility]) 04/15/2019  ? Chronic kidney disease (CKD) stage G2/A3, mildly decreased glomerular filtration rate (GFR) between 60-89 mL/min/1.73 square meter and albuminuria creatinine  ratio greater than 300 mg/g 04/15/2019  ? Proteinuria 04/15/2019  ? Hyperlipidemia associated with type 2 diabetes mellitus ([place]) 03/18/2019  ? Diabetes mellitus ([name]) 03/18/2019  ? Idiopathic chronic gout of multiple sites without tophus 03/18/2019  ? Essential hypertension 03/18/2019  ? Dermatitis 03/18/2019  ? Lumbar degenerative disc disease 11/22/2015  ? Cervical myelopathy ([place]) 11/22/2015  ? Osteoarthritis of spine with radiculopathy, lumbar region 11/22/2015  ? ? ?REFERRING DIAG: s/p cervical spine fusion, cervical myelopathy ? ?THERAPY DIAG:  ?Cervicalgia - Plan: PT plan of care cert/re-cert ? ?Radiculopathy, cervical region - Plan: PT plan of care cert/re-cert ? ?Muscle weakness (generalized) - Plan: PT plan of care cert/re-cert ? ?Other muscle spasm - Plan: PT plan of care cert/re-cert ? ?Abnormal posture - Plan: PT plan of care cert/re-cert ? ?PERTINENT HISTORY: Patient is a 56 y.o. male who presents to outpatient physical therapy with a referral for medical diagnosis cervical spine fusion, cervical myelopathy. This patient's chief complaints consist of tightness, pain, restricted ROM at the cervical spine and back of head and B UE pain/paresthesia leading to the following functional deficits: difficulty with physical activities, prolonged sitting or standing, using B UE, picking things up, reaching, yardwork, using tractor, driving, riding in vehicle, turning his head, viewing his surroundings, traveling, rolling over at night, sleeping on his side, dressing, ADLs, IADLs.  Relevant past medical history and comorbidities include cervical myelopathy leading to C2-6 laminectomy and C2-T2 fusion performed 02/01/2021, HTN, DMII, hyperlipidemia, lumbar degenerative disc disease, current smoker, obesity, systolic murmur, chronic kidney disease, cervical myelopathy, gout, diverticulitis. Patient does not read/write well and needs assistance with completing paperwork. ? ?PRECAUTIONS: don't lift over a certain  amount of weight (unsure but thinks maybe 10#?), bending over, picking heavy stuff up (he does not know if/when these precautions expire. He was told not to bend over or pick up anything heavy. ? ? ?SUBJECTIVE: Patient reports PT is helping him a log. He states he is able to turn his head a lot more, he can lift more and is no longer having difficulty putting a gallon of milk on a shelf at head level, drive better and look up easier. He states he is stronger lifting things, using a sweeper on his hardwood floors. He states his pain is better. He states he still has soreness in B UT but it is not as bad. He still has tightness but not as much pain. He still has difficulty picking up heavy things such as the steel chairs on his front porch, and it is still difficult to sleep. He always gets awoken at 4am. He still has difficulty turning his head and viewing his surroundings and this makes it more difficult to drive. He still drops things. He is doing his HEP well. He cannot turn his head enough to look behind him. He has difficulty sleeping on his side due to numbness in his fingers and pain. He reports current finger numbness in his right digits 2-3. He currently has tightness/stiffness in the back of his neck and over B UT.  ? ?PAIN:  ?Are you having pain? Yes 2/10 over back of neck and B UT. ? ?OBJECTIVE ? ?SELF-REPORTED FUNCTION ?FOTO score: 46/100 (neck questionnaire) ? ?SPINE MOTION ?Cervical Spine AROM (measured prior to manual unless otherwise noted) ?*Indicates pain ?Flexion: 23 ?Extension: 5 ?Side Flexion:  ?      R 12 ?      L 7 ?Rotation (prior to manual):  ?R 24  ?L 25 ?Rotation (after manual):  ?R 30  ?L 28 ?Uncomfortable tightness noted while performing all motions.  ?  ?  ?MUSCLE PERFORMANCE (MMT):  ?*Indicates pain 12/19/21 01/30/22 Date  ?Joint/Motion R/L R/L R/L  ?Shoulder        ?Flexion 3+/3+ 4+/4+ /  ?Abduction (C5) 4/3+ 4/44 /  ?External rotation 3/3 4+/4 /  ?Internal rotation 4/4 5/5 /  ?Elbow         ?Flexion (C6) 4/4 5/4 /  ?Extension (C7) 3/3 4/4 /  ?Wrist        ?Flexion (C7) 4-/4- 4-/4- /  ?Extension (C6) 3/3 4+/4 /  ?Radial deviation 4+/4+ 4/3+ /  ?Ulnar deviation (C8) 4+/4+ 4+/4 /  ?Hand        ?Thumb extension (C8) 4/4 4+/4+ /  ?Finger abduction (T1) unclear WFL /  ?Grip (C8) Decr. B ~4/~4 /  ?Comments:  ?12/19/21: Patient weakness most notable when unable to hold against resistance over several seconds. ?01/30/22: firmer quality to all shoulder and elbow MMT. improved grip strength but patient still cannot keep towel from sliding when holding it to perform self stretches (rotational SNAGs) for his neck.  ? ?TODAY'S TREATMENT:  ? ?Therapeutic exercise: to centralize symptoms and improve ROM, strength, muscular endurance, and activity tolerance required for successful completion of functional activities.  ?- measurements to assess progress (see above) ?-  standing scapular shrugs holding 7#DB in each hand, focusing on completely relaxing muscle at end of eccentric phase. 3x10 ?- seated row at [place] with wide grip, chest braced against seat, 2x15 with 15#. Pillow folded over at chest. (Almost dropped handle) ?- seated (pillow folded at chest) lat pull at [place], 2x15 with 25# ?- seated chest press with plus at [place], 2x10 with 10#.  ?  ?Manual therapy: to reduce pain and tissue tension, improve range of motion, neuromodulation, in order to promote improved ability to complete functional activities. ? ?HOOKLYING with feet elevated ?- STM to posterior cervical spine musculature focusing on B suboccipitals. ?- PROM rotation with overpressure/oscillations as tolerated each direction focusing on upper cervical spine. 1x10 each side with 10 second hold/oscillations.   ? ?Pt required multimodal cuing for proper technique and to facilitate improved neuromuscular control, strength, range of motion, and functional ability resulting in improved performance and form. ?  ?  ?HOME EXERCISE  PROGRAM: ?Access Code: [email or web address] ?URL: [email or web address] ?Date: 12/26/2021 ?Prepared by: Rosita Kea ?  ?Exercises ?Seated Assisted Cervical Rotation with Towel - 2 x daily - 2 sets - 10 reps -

## 2022-01-31 ENCOUNTER — Other Ambulatory Visit: Payer: Self-pay | Admitting: Family Medicine

## 2022-01-31 DIAGNOSIS — E1169 Type 2 diabetes mellitus with other specified complication: Secondary | ICD-10-CM

## 2022-02-01 ENCOUNTER — Ambulatory Visit: Payer: Medicaid Other | Admitting: Physical Therapy

## 2022-02-05 LAB — HM DIABETES EYE EXAM

## 2022-02-06 ENCOUNTER — Encounter: Payer: Medicaid Other | Admitting: Physical Therapy

## 2022-02-12 ENCOUNTER — Ambulatory Visit: Payer: Medicaid Other | Attending: Physician Assistant | Admitting: Physical Therapy

## 2022-02-12 ENCOUNTER — Encounter: Payer: Self-pay | Admitting: Physical Therapy

## 2022-02-12 DIAGNOSIS — M542 Cervicalgia: Secondary | ICD-10-CM | POA: Diagnosis not present

## 2022-02-12 DIAGNOSIS — M62838 Other muscle spasm: Secondary | ICD-10-CM | POA: Diagnosis not present

## 2022-02-12 DIAGNOSIS — R293 Abnormal posture: Secondary | ICD-10-CM | POA: Insufficient documentation

## 2022-02-12 DIAGNOSIS — M5412 Radiculopathy, cervical region: Secondary | ICD-10-CM | POA: Insufficient documentation

## 2022-02-12 DIAGNOSIS — M6281 Muscle weakness (generalized): Secondary | ICD-10-CM | POA: Diagnosis not present

## 2022-02-12 NOTE — Therapy (Signed)
OUTPATIENT PHYSICAL THERAPY TREATMENT NOTE / PROGRESS NOTE Dates of reporting: 12/19/2021 to 02/12/2022   Patient Name: Cory Rivas MRN: [id number] DOB:May 23, 1966, 56 y.o., male Today's Date: 02/12/2022  PCP: Danelle Berry, PA-C REFERRING PROVIDER: Donnamarie Poag, PA   PT End of Session - 02/12/22 1040     Visit Number 10    Number of Visits 24    Date for PT Re-Evaluation 04/24/22    Authorization Type [place] MEDICAID HEALTHY BLUE reporting period from 12/19/2021    Authorization Time Period [place] [id number] 3/14-4/20 12 PT visits; HB Auth# [id number] 4/25-6/2 12 PT visits    Authorization - Visit Number 9    Authorization - Number of Visits 20    Progress Note Due on Visit 10    PT Start Time 1035    PT Stop Time 1119    PT Time Calculation (min) 44 min    Activity Tolerance Patient tolerated treatment well    Behavior During Therapy [facility] for tasks assessed/performed                  Past Medical History:  Diagnosis Date   Bulging of cervical intervertebral disc    Diabetes mellitus without complication (HCC)    Gout    High cholesterol    Hypertension    Stress due to family tension 04/15/2019   Past Surgical History:  Procedure Laterality Date   CHOLECYSTECTOMY     COLONOSCOPY WITH PROPOFOL N/A 03/10/2020   Procedure: COLONOSCOPY WITH PROPOFOL;  Surgeon: Midge Minium, MD;  Location: [facility] ENDOSCOPY;  Service: Endoscopy;  Laterality: N/A;   Patient Active Problem List   Diagnosis Date Noted   S/P cervical spinal fusion 08/18/2021   Former heavy tobacco smoker 08/18/2021   Gout 08/18/2021   Other chronic pain 10/26/2020   Systolic murmur 10/26/2020   Tobacco use 10/26/2020   Polyp of ascending colon    Rectal polyp    Class 2 severe obesity with serious comorbidity and body mass index (BMI) of 39.0 to 39.9 in adult [facility]) 04/15/2019   Chronic kidney disease (CKD) stage G2/A3, mildly decreased glomerular filtration rate (GFR) between 60-89 mL/min/1.73 square meter  and albuminuria creatinine ratio greater than 300 mg/g 04/15/2019   Proteinuria 04/15/2019   Hyperlipidemia associated with type 2 diabetes mellitus (HCC) 03/18/2019   Diabetes mellitus (HCC) 03/18/2019   Idiopathic chronic gout of multiple sites without tophus 03/18/2019   Essential hypertension 03/18/2019   Dermatitis 03/18/2019   Lumbar degenerative disc disease 11/22/2015   Cervical myelopathy (HCC) 11/22/2015   Osteoarthritis of spine with radiculopathy, lumbar region 11/22/2015    REFERRING DIAG: s/p cervical spine fusion, cervical myelopathy  THERAPY DIAG:  Cervicalgia  Radiculopathy, cervical region  Muscle weakness (generalized)  Other muscle spasm  Abnormal posture  PERTINENT HISTORY: Patient is a 56 y.o. male who presents to outpatient physical therapy with a referral for medical diagnosis cervical spine fusion, cervical myelopathy. This patient's chief complaints consist of tightness, pain, restricted ROM at the cervical spine and back of head and B UE pain/paresthesia leading to the following functional deficits: difficulty with physical activities, prolonged sitting or standing, using B UE, picking things up, reaching, yardwork, using tractor, driving, riding in vehicle, turning his head, viewing his surroundings, traveling, rolling over at night, sleeping on his side, dressing, ADLs, IADLs. Relevant past medical history and comorbidities include cervical myelopathy leading to C2-6 laminectomy and C2-T2 fusion performed 02/01/2021, HTN, DMII, hyperlipidemia, lumbar degenerative disc disease, current smoker, obesity, systolic  murmur, chronic kidney disease, cervical myelopathy, gout, diverticulitis. Patient does not read/write well and needs assistance with completing paperwork.  PRECAUTIONS: don't lift over a certain amount of weight (unsure but thinks maybe 10#?), bending over, picking heavy stuff up (he does not know if/when these precautions expire. He was told not to bend  over or pick up anything heavy.   SUBJECTIVE: Patient reports his neck is pretty stiff expecially in the right and in the right UT, CT region. It is his birthday today and he is planning to go get a new mower in [place] after his PT appointment. He cannot come to PT the rest of this week because he has to take his mother to an appointment and go for a check up at [facility]. He states he has been riding a lot which makes his neck stiff. He is getting a new mower/tractor because his zero turn  mower throws him around too much, which makes his neck hurt. He lost his red band but he found it. He still cannot find his house key. He states he had a tick bite on his right ankle that he noticed last Saturday. He states it was tiny and orange.   PAIN:  Are you having pain? Yes, dull tightness "like someone came up and poke you." Does not provide numeric rating. Still has numbness in the 2nd and 3rd digits of the right fingers.   OBJECTIVE (all measurements from 01/23/2022)   SELF-REPORTED FUNCTION FOTO score: 46/100 (neck questionnaire)   SPINE MOTION Cervical Spine AROM (measured prior to manual unless otherwise noted) *Indicates pain Flexion: 23 Extension: 5 Side Flexion:        R 12       L 7 Rotation (prior to manual):  R 24  L 25 Rotation (after manual):  R 30  L 28 Uncomfortable tightness noted while performing all motions.      MUSCLE PERFORMANCE (MMT):  *Indicates pain 12/19/21 01/30/22 Date  Joint/Motion R/L R/L R/L  Shoulder        Flexion 3+/3+ 4+/4+ /  Abduction (C5) 4/3+ 4/44 /  External rotation 3/3 4+/4 /  Internal rotation 4/4 5/5 /  Elbow        Flexion (C6) 4/4 5/4 /  Extension (C7) 3/3 4/4 /  Wrist        Flexion (C7) 4-/4- 4-/4- /  Extension (C6) 3/3 4+/4 /  Radial deviation 4+/4+ 4/3+ /  Ulnar deviation (C8) 4+/4+ 4+/4 /  Hand        Thumb extension (C8) 4/4 4+/4+ /  Finger abduction (T1) unclear WFL /  Grip (C8) Decr. B ~4/~4 /  Comments:  12/19/21: Patient  weakness most notable when unable to hold against resistance over several seconds. 01/30/22: firmer quality to all shoulder and elbow MMT. improved grip strength but patient still cannot keep towel from sliding when holding it to perform self stretches (rotational SNAGs) for his neck.    TODAY'S TREATMENT:   Therapeutic exercise: to centralize symptoms and improve ROM, strength, muscular endurance, and activity tolerance required for successful completion of functional activities.  - NuStep level 5 using bilateral upper and lower extremities. Seat/handle setting 7/6. For improved extremity mobility, muscular endurance, and activity tolerance; and to induce the analgesic effect of aerobic exercise, stimulate improved joint nutrition, and prepare body structures and systems for following interventions. x 5:27  minutes. Average SPM = 79. - standing scapular shrugs holding 7#DB in each hand, focusing on completely relaxing muscle at  end of eccentric phase. 3x10 - seated row at [facility] machine with wide grip, chest braced against seat, 2x15 with 15#. Pillow folded over at chest.  - seated (pillow folded at chest) lat pull at [facility] machine, 2x15 with 25# - seated chest press with plus at [facility] machine, 2x10 with 10#.    Manual therapy: to reduce pain and tissue tension, improve range of motion, neuromodulation, in order to promote improved ability to complete functional activities.  HOOKLYING with feet elevated - STM to posterior cervical spine musculature focusing on B suboccipital and UT.  - PROM rotation with overpressure/oscillations as tolerated each direction focusing on upper cervical spine. 1x10 each side with 10 second hold/oscillations.    Pt required multimodal cuing for proper technique and to facilitate improved neuromuscular control, strength, range of motion, and functional ability resulting in improved performance and form.     HOME EXERCISE PROGRAM: Access Code: [email or web address] URL:  [email or web address] Date: 12/26/2021 Prepared by: Norton Blizzard   Exercises Seated Assisted Cervical Rotation with Towel - 2 x daily - 2 sets - 10 reps - 5 seconds hold Standing Median Nerve Glide - 2 x daily - 1 sets - 15 reps   PATIENT EDUCATION: Education details: Exercise purpose/form, POC, progress.  Person educated: Patient Education method: Explanation, Demonstration, Tactile cues, and Verbal cues Education comprehension: verbalized understanding, returned demonstration, verbal cues required, tactile cues required, and needs further education    PT Short Term Goals       PT SHORT TERM GOAL #1   Title Be independent with initial home exercise program for self-management of symptoms.    Baseline Initial HEP to be provided visit 2 as appropriate (12/19/2021);    Time 2    Period Weeks    Status Achieved    Target Date 01/02/22              PT Long Term Goals - 12/19/21 1112    TARGET DATE FOR ALL LONG TERM GOALS: 03/13/2022. UPDATED TO TARGET DATE OF 04/24/2022 FOR ALL UNMET GOALS (01/30/2022);    PT LONG TERM GOAL #1   Title Be independent with a long-term home exercise program for self-management of symptoms.    Baseline Initial HEP to be provided visit 2 as appropriate (12/19/2021); continues to participate well (01/30/2022);    Time 12    Period Weeks    Status In-Progress     PT LONG TERM GOAL #2   Title Demonstrate improved FOTO score by 10 units to demonstrate improvement in overall condition and self-reported functional ability.    Baseline to be measured at visit 2 as appropriate, patient needs assistance (11/21/2021); 38 at visit#2 (12/26/2021); 37 at visit#5 (01/04/2022); 46 at visit#9 (01/30/22);    Time 12    Period Weeks    Status In-Progress     PT LONG TERM GOAL #3   Title Pt will increase UE strength of by at least 1/2 MMT grade in order to demonstrate improvement in strength and function.    Baseline ranges from 3/5 to 4+/5 - see objective  exam (12/19/2021); significantly improved strength and firmness against resistance at shoulder and elbow - still some difficulty  holding and weakness in hands and forearm - see objective exam (01/30/22);   Time 12    Period Weeks    Status In-Progress     PT LONG TERM GOAL #4   Title Improve cervical AROM rotation to equal or greater than 45 degrees each direction and cervical extension to  15 degrees to improve ability to check blind spot when driving, view surroundings, and allow patient to complete valued activities with less difficulty.    Baseline Extension: 0 Rotation: R 10,  L 25 (12/19/2021); Extension: 5, Rotation: R 24, L 25 (01/30/2022);   Time 12    Period Weeks    Status In progress     PT LONG TERM GOAL #5   Title Complete community, work and/or recreational activities without limitation due to current condition    Baseline Functional Limitations: difficulty with physical activities, prolonged sitting or standing, using B UE, picking things up, reaching, yardwork, using tractor, driving, riding in vehicle, turning his head, viewing his surroundings, traveling, rolling over at night, sleeping on his side, dressing, ADLs, IADLs (12/19/2021); patient reports improvements in all activities but continues to have difficulties such as difficulty picking up heavy things such as the steel chairs on his front porch, and it is still difficult to sleep. He always gets awoken at 4am. He still has difficulty turning his head and viewing his surroundings and this makes it more difficult to drive. He still drops things. He cannot turn his head enough to look behind him. He has difficulty sleeping on his side due to numbness in his fingers and pain (01/30/2022);    Time 12    Period Weeks    Status In-Progress             Plan    Clinical Impression Statement Patient has attended 10 physical therapy sessions since starting this episode of care on 12/19/2021. Patient has demonstrated good attendance and  participation in HEP. His goals were updated last visit and he has been waiting for more insurance authorization before returning to PT today. Recommended to patient he consult his doctor about the tick bite ASAP. He reports improved quality of life and shows an improvement on his FOTO score for neck from 38/100 to 46/100 suggesting significant improvement in self-reported function. He demonstrates improved cervical spine AROM extension from 0 to 5 degrees and right rotation from 10 degrees to 24 degrees prior to manual therapy and to 30 degrees after manual therapy and from 25 to 28 degrees on the left after manual therapy at last visit. He reports decreased pain and improved participation in ADLs and IADLs. He demonstrates significant improvement in strength and quality of strength for B shoulder and elbows and mild improvements at the hand and wrist. Patient continues to have potential for improvement in cervical spine rotation AROM of up to 45 degrees each side and has not showed a plateu in progress at this point. Patient would benefit from continued PT to continue addressing impairments and functional limitations including difficulty picking up heavy things such as the steel chairs on his front porch, being woken up at 4am and unable to sleep on side.  He still has difficulty turning his head and viewing his surroundings and this makes it more difficult to drive. He still drops things. He cannot turn his head enough to look behind him. He has difficulty sleeping on his side due to numbness in his fingers and pain. Patient would benefit from continued management of limiting condition by skilled physical therapist to address remaining impairments and functional limitations to work towards stated goals and return to PLOF or maximal functional independence. PT recommends 15 more visits at a rate of 1-2 times per week to continue working on patients deficits.     Personal Factors and Comorbidities Comorbidity  3+;Past/Current Experience;Fitness;Time since  onset of injury/illness/exacerbation;Education;Social Background    Comorbidities Relevant past medical history and comorbidities include cervical myelopathy leading to C2-6 laminectomy and  C2-T2 fusion performed 02/01/2021, HTN, DMII, hyperlipidemia, lumbar degenerative disc disease, current smoker, obesity, systolic murmur, chronic kidney disease, cervical myelopathy, gout, diverticulitis.    Examination-Activity Limitations Bathing;Hygiene/Grooming;Squat;Lift;Bed Mobility;Bend;Locomotion Level;Stand;Reach Overhead;Carry;Sit;Dressing;Sleep    Examination-Participation Restrictions Community Activity;Yard Work;Driving;Interpersonal Relationship    Stability/Clinical Decision Making Evolving/Moderate complexity    Rehab Potential Good    PT Frequency 2x / week    PT Duration 12 weeks    PT Treatment/Interventions ADLs/Self Care Home Management;Aquatic Therapy;Cryotherapy;Moist Heat;Electrical Stimulation;Neuromuscular re-education;Therapeutic exercise;Therapeutic activities;Balance training;Patient/family education;Manual techniques;Dry needling;Passive range of motion    PT Next Visit Plan updated HEP as appropriate, postural strengthening, ROM, nerve glides and manual therapy as appropriate    PT Home Exercise Plan Medbridge Access Code: [email or web address]    Consulted and Agree with Plan of Care Patient             Luretha Murphy. Ilsa Iha, PT, DPT 02/12/22, 11:19 AM  [facility] [address] [place], [place] [address] P: [phone] I F: [phone]

## 2022-02-14 ENCOUNTER — Encounter: Payer: Medicaid Other | Admitting: Physical Therapy

## 2022-02-15 NOTE — Therapy (Signed)
?OUTPATIENT PHYSICAL THERAPY TREATMENT NOTE  ? ? ?Patient Name: Cory Rivas ?MRN: [id number] ?DOB:[birth year], 56 y.o., male ?Today's Date: 02/19/2022 ? ?PCP: Delsa Grana, PA-C ?REFERRING PROVIDER: Hazeline Junker, PA ? ? PT End of Session - 02/19/22 1119   ? ? Visit Number 11   ? Number of Visits 24   ? Date for PT Re-Evaluation 04/24/22   ? Authorization Type [place] MEDICAID [employer] BLUE reporting period from 02/12/2022   ? Authorization Time Period HB [id number] 3/14-4/20 12 PT visits; HB Auth# [id number] 4/25-6/2 12 PT visits   ? Authorization - Visit Number 10   ? Authorization - Number of Visits 20   ? Progress Note Due on Visit 20   ? PT Start Time 1116   ? PT Stop Time 1201   ? PT Time Calculation (min) 45 min   ? Activity Tolerance Patient tolerated treatment well   ? Behavior During Therapy [facility] for tasks assessed/performed   ? ?  ?  ? ?  ? ? ? ? ? ? ? ? ?Past Medical History:  ?Diagnosis Date  ? Bulging of cervical intervertebral disc   ? Diabetes mellitus without complication ([place])   ? Gout   ? High cholesterol   ? Hypertension   ? Stress due to family tension 04/15/2019  ? ?Past Surgical History:  ?Procedure Laterality Date  ? CHOLECYSTECTOMY    ? COLONOSCOPY WITH PROPOFOL N/A 03/10/2020  ? Procedure: COLONOSCOPY WITH PROPOFOL;  Surgeon: Lucilla Lame, MD;  Location: [facility] ENDOSCOPY;  Service: Endoscopy;  Laterality: N/A;  ? ?Patient Active Problem List  ? Diagnosis Date Noted  ? S/P cervical spinal fusion 08/18/2021  ? Former heavy tobacco smoker 08/18/2021  ? Gout 08/18/2021  ? Other chronic pain 10/26/2020  ? Systolic murmur 12/45/8099  ? Tobacco use 10/26/2020  ? Polyp of ascending colon   ? Rectal polyp   ? Class 2 severe obesity with serious comorbidity and body mass index (BMI) of 39.0 to 39.9 in adult [facility]) 04/15/2019  ? Chronic kidney disease (CKD) stage G2/A3, mildly decreased glomerular filtration rate (GFR) between 60-89 mL/min/1.73 square meter and albuminuria creatinine ratio greater than 300 mg/g  04/15/2019  ? Proteinuria 04/15/2019  ? Hyperlipidemia associated with type 2 diabetes mellitus ([place]) 03/18/2019  ? Diabetes mellitus ([place]) 03/18/2019  ? Idiopathic chronic gout of multiple sites without tophus 03/18/2019  ? Essential hypertension 03/18/2019  ? Dermatitis 03/18/2019  ? Lumbar degenerative disc disease 11/22/2015  ? Cervical myelopathy ([place]) 11/22/2015  ? Osteoarthritis of spine with radiculopathy, lumbar region 11/22/2015  ? ? ?REFERRING DIAG: s/p cervical spine fusion, cervical myelopathy ? ?THERAPY DIAG:  ?Cervicalgia ? ?Radiculopathy, cervical region ? ?Muscle weakness (generalized) ? ?Other muscle spasm ? ?PERTINENT HISTORY: Patient is a 56 y.o. male who presents to outpatient physical therapy with a referral for medical diagnosis cervical spine fusion, cervical myelopathy. This patient's chief complaints consist of tightness, pain, restricted ROM at the cervical spine and back of head and B UE pain/paresthesia leading to the following functional deficits: difficulty with physical activities, prolonged sitting or standing, using B UE, picking things up, reaching, yardwork, using tractor, driving, riding in vehicle, turning his head, viewing his surroundings, traveling, rolling over at night, sleeping on his side, dressing, ADLs, IADLs. Relevant past medical history and comorbidities include cervical myelopathy leading to C2-6 laminectomy and C2-T2 fusion performed 02/01/2021, HTN, DMII, hyperlipidemia, lumbar degenerative disc disease, current smoker, obesity, systolic murmur, chronic kidney disease, cervical myelopathy, gout, diverticulitis. Patient does  not read/write well and needs assistance with completing paperwork. ? ?PRECAUTIONS: don't lift over a certain amount of weight (unsure but thinks maybe 10#?), bending over, picking heavy stuff up (he does not know if/when these precautions expire. He was told not to bend over or pick up anything heavy. ? ? ?SUBJECTIVE: Patient reports he feels  well and his neck is moving better since last session. He states overall he feels better. He got a new tractor to [employer] with and he has been doing that this weekend. It has not been bothering his neck like the lawn mower was because it moves more smoothly. He did not sleep well last night and didn't go to sleep until 2:30am. He states his neck feels a bit tight. Patient states he has started being more active lately and his neck has not been tightening up on him with more activity like it used to. Patient states his left elbow has been bothering him a bit.  ? ?PAIN:  ?Are you having pain? No. Still has numbness in the 2nd and 3rd digits of the right fingers.  ? ?OBJECTIVE   ?  ?TODAY'S TREATMENT:  ? ?Therapeutic exercise: to centralize symptoms and improve ROM, strength, muscular endurance, and activity tolerance required for successful completion of functional activities.  ?- NuStep level 5-6 using bilateral upper and lower extremities. Seat/handle setting 7/6. For improved extremity mobility, muscular endurance, and activity tolerance; and to induce the analgesic effect of aerobic exercise, stimulate improved joint nutrition, and prepare body structures and systems for following interventions. x 5:27  minutes. Average SPM = 79. ?- seated overhead press, 3x10 with 5#DB in each hand.  ?- seated row at [place] with wide grip, chest braced against seat, 3x15 with 35#. Pillow folded over at chest.  ?- seated (pillow folded at chest) lat pull at [facility] machine, 3x15 with 35# ?- seated chest press with plus at [facility] machine, 3x15 with 15#. (Mild left elbow discomfort).  ? ?Manual therapy: to reduce pain and tissue tension, improve range of motion, neuromodulation, in order to promote improved ability to complete functional activities. ? ?HOOKLYING with feet elevated ?- STM to posterior cervical spine musculature focusing on B suboccipital and UT.  ?- PROM rotation with overpressure/oscillations as tolerated each  direction focusing on upper cervical spine. 1x10 each side with 10 second hold/oscillations.   ? ?Pt required multimodal cuing for proper technique and to facilitate improved neuromuscular control, strength, range of motion, and functional ability resulting in improved performance and form. ?  ?  ?HOME EXERCISE PROGRAM: ?Access Code: [email or web address] ?URL: [email or web address] ?Date: 12/26/2021 ?Prepared by: Rosita Kea ?  ?Exercises ?Seated Assisted Cervical Rotation with Towel - 2 x daily - 2 sets - 10 reps - 5 seconds hold ?Standing Median Nerve Glide - 2 x daily - 1 sets - 15 reps ?  ?PATIENT EDUCATION: ?Education details: Exercise purpose/form, POC, progress.  ?Person educated: Patient ?Education method: Explanation, Demonstration, Tactile cues, and Verbal cues ?Education comprehension: verbalized understanding, returned demonstration, verbal cues required, tactile cues required, and needs further education ? ? ? PT Short Term Goals   ? ?  ? PT SHORT TERM GOAL #1  ? Title Be independent with initial home exercise program for self-management of symptoms.   ? Baseline Initial HEP to be provided visit 2 as appropriate (12/19/2021);   ? Time 2   ? Period Weeks   ? Status Achieved   ? Target Date 01/02/22   ? ?  ?  ? ?  ? ? ?  PT Long Term Goals - 12/19/21 1112   ? TARGET DATE FOR ALL LONG TERM GOALS: 03/13/2022. UPDATED TO TARGET DATE OF 04/24/2022 FOR ALL UNMET GOALS (01/30/2022); ?  ? PT LONG TERM GOAL #1  ? Title Be independent with a long-term home exercise program for self-management of symptoms.   ? Baseline Initial HEP to be provided visit 2 as appropriate (12/19/2021); continues to participate well (01/30/2022);   ? Time 12   ? Period Weeks   ? Status In-Progress  ?  ? PT LONG TERM GOAL #2  ? Title Demonstrate improved FOTO score by 10 units to demonstrate improvement in overall condition and self-reported functional ability.   ? Baseline to be measured at visit 2 as appropriate, patient needs assistance  (11/21/2021); 38 at visit#2 (12/26/2021); 37 at visit#5 (01/04/2022); 46 at visit#9 (01/30/22);   ? Time 12   ? Period Weeks   ? Status In-Progress  ?  ? PT LONG TERM GOAL #3  ? Title Pt will increase UE strength of

## 2022-02-19 ENCOUNTER — Ambulatory Visit: Payer: Medicaid Other | Admitting: Physical Therapy

## 2022-02-19 ENCOUNTER — Encounter: Payer: Self-pay | Admitting: Physical Therapy

## 2022-02-19 DIAGNOSIS — M6281 Muscle weakness (generalized): Secondary | ICD-10-CM | POA: Diagnosis not present

## 2022-02-19 DIAGNOSIS — M5412 Radiculopathy, cervical region: Secondary | ICD-10-CM | POA: Diagnosis not present

## 2022-02-19 DIAGNOSIS — M62838 Other muscle spasm: Secondary | ICD-10-CM

## 2022-02-19 DIAGNOSIS — M542 Cervicalgia: Secondary | ICD-10-CM | POA: Diagnosis not present

## 2022-02-19 DIAGNOSIS — R293 Abnormal posture: Secondary | ICD-10-CM | POA: Diagnosis not present

## 2022-02-20 NOTE — Therapy (Incomplete)
?OUTPATIENT PHYSICAL THERAPY TREATMENT NOTE  ? ? ?Patient Name: Cory Rivas ?MRN: [id number] ?DOB:1965-11-15, 56 y.o., male ?Today's Date: 02/20/2022 ? ?PCP: Delsa Grana, PA-C ?REFERRING PROVIDER: Hazeline Junker, PA ? ? ? ? ? ? ? ? ? ? ?Past Medical History:  ?Diagnosis Date  ? Bulging of cervical intervertebral disc   ? Diabetes mellitus without complication ([place])   ? Gout   ? High cholesterol   ? Hypertension   ? Stress due to family tension 04/15/2019  ? ?Past Surgical History:  ?Procedure Laterality Date  ? CHOLECYSTECTOMY    ? COLONOSCOPY WITH PROPOFOL N/A 03/10/2020  ? Procedure: COLONOSCOPY WITH PROPOFOL;  Surgeon: Lucilla Lame, MD;  Location: [facility] ENDOSCOPY;  Service: Endoscopy;  Laterality: N/A;  ? ?Patient Active Problem List  ? Diagnosis Date Noted  ? S/P cervical spinal fusion 08/18/2021  ? Former heavy tobacco smoker 08/18/2021  ? Gout 08/18/2021  ? Other chronic pain 10/26/2020  ? Systolic murmur 49/67/5916  ? Tobacco use 10/26/2020  ? Polyp of ascending colon   ? Rectal polyp   ? Class 2 severe obesity with serious comorbidity and body mass index (BMI) of 39.0 to 39.9 in adult [facility]) 04/15/2019  ? Chronic kidney disease (CKD) stage G2/A3, mildly decreased glomerular filtration rate (GFR) between 60-89 mL/min/1.73 square meter and albuminuria creatinine ratio greater than 300 mg/g 04/15/2019  ? Proteinuria 04/15/2019  ? Hyperlipidemia associated with type 2 diabetes mellitus ([place]) 03/18/2019  ? Diabetes mellitus ([place]) 03/18/2019  ? Idiopathic chronic gout of multiple sites without tophus 03/18/2019  ? Essential hypertension 03/18/2019  ? Dermatitis 03/18/2019  ? Lumbar degenerative disc disease 11/22/2015  ? Cervical myelopathy ([place]) 11/22/2015  ? Osteoarthritis of spine with radiculopathy, lumbar region 11/22/2015  ? ? ?REFERRING DIAG: s/p cervical spine fusion, cervical myelopathy ? ?THERAPY DIAG:  ?No diagnosis found. ? ?PERTINENT HISTORY: Patient is a 56 y.o. male who presents to outpatient physical  therapy with a referral for medical diagnosis cervical spine fusion, cervical myelopathy. This patient's chief complaints consist of tightness, pain, restricted ROM at the cervical spine and back of head and B UE pain/paresthesia leading to the following functional deficits: difficulty with physical activities, prolonged sitting or standing, using B UE, picking things up, reaching, yardwork, using tractor, driving, riding in vehicle, turning his head, viewing his surroundings, traveling, rolling over at night, sleeping on his side, dressing, ADLs, IADLs. Relevant past medical history and comorbidities include cervical myelopathy leading to C2-6 laminectomy and C2-T2 fusion performed 02/01/2021, HTN, DMII, hyperlipidemia, lumbar degenerative disc disease, current smoker, obesity, systolic murmur, chronic kidney disease, cervical myelopathy, gout, diverticulitis. Patient does not read/write well and needs assistance with completing paperwork. ? ?PRECAUTIONS: don't lift over a certain amount of weight (unsure but thinks maybe 10#?), bending over, picking heavy stuff up (he does not know if/when these precautions expire. He was told not to bend over or pick up anything heavy. ? ? ?SUBJECTIVE: Patient reports he feels well and his neck is moving better since last session. He states overall he feels better. He got a new tractor to [employer] with and he has been doing that this weekend. It has not been bothering his neck like the lawn mower was because it moves more smoothly. He did not sleep well last night and didn't go to sleep until 2:30am. He states his neck feels a bit tight. Patient states he has started being more active lately and his neck has not been tightening up on him with more  activity like it used to. Patient states his left elbow has been bothering him a bit.  ? ?PAIN:  ?Are you having pain? No. Still has numbness in the 2nd and 3rd digits of the right fingers.  ? ?OBJECTIVE   ?  ?TODAY'S TREATMENT:   ? ?Therapeutic exercise: to centralize symptoms and improve ROM, strength, muscular endurance, and activity tolerance required for successful completion of functional activities.  ?- NuStep level 5-6 using bilateral upper and lower extremities. Seat/handle setting 7/6. For improved extremity mobility, muscular endurance, and activity tolerance; and to induce the analgesic effect of aerobic exercise, stimulate improved joint nutrition, and prepare body structures and systems for following interventions. x 5:27  minutes. Average SPM = 79. ?- seated overhead press, 3x10 with 5#DB in each hand.  ?- seated row at [place] with wide grip, chest braced against seat, 3x15 with 35#. Pillow folded over at chest.  ?- seated (pillow folded at chest) lat pull at [facility] machine, 3x15 with 35# ?- seated chest press with plus at [facility] machine, 3x15 with 15#. (Mild left elbow discomfort).  ? ?Manual therapy: to reduce pain and tissue tension, improve range of motion, neuromodulation, in order to promote improved ability to complete functional activities. ? ?HOOKLYING with feet elevated ?- STM to posterior cervical spine musculature focusing on B suboccipital and UT.  ?- PROM rotation with overpressure/oscillations as tolerated each direction focusing on upper cervical spine. 1x10 each side with 10 second hold/oscillations.   ? ?Pt required multimodal cuing for proper technique and to facilitate improved neuromuscular control, strength, range of motion, and functional ability resulting in improved performance and form. ?  ?  ?HOME EXERCISE PROGRAM: ?Access Code: [email or web address] ?URL: [email or web address] ?Date: 12/26/2021 ?Prepared by: Rosita Kea ?  ?Exercises ?Seated Assisted Cervical Rotation with Towel - 2 x daily - 2 sets - 10 reps - 5 seconds hold ?Standing Median Nerve Glide - 2 x daily - 1 sets - 15 reps ?  ?PATIENT EDUCATION: ?Education details: Exercise purpose/form, POC, progress.  ?Person educated:  Patient ?Education method: Explanation, Demonstration, Tactile cues, and Verbal cues ?Education comprehension: verbalized understanding, returned demonstration, verbal cues required, tactile cues required, and needs further education ? ? ? PT Short Term Goals   ? ?  ? PT SHORT TERM GOAL #1  ? Title Be independent with initial home exercise program for self-management of symptoms.   ? Baseline Initial HEP to be provided visit 2 as appropriate (12/19/2021);   ? Time 2   ? Period Weeks   ? Status Achieved   ? Target Date 01/02/22   ? ?  ?  ? ?  ? ? ? PT Long Term Goals - 12/19/21 1112   ? TARGET DATE FOR ALL LONG TERM GOALS: 03/13/2022. UPDATED TO TARGET DATE OF 04/24/2022 FOR ALL UNMET GOALS (01/30/2022); ?  ? PT LONG TERM GOAL #1  ? Title Be independent with a long-term home exercise program for self-management of symptoms.   ? Baseline Initial HEP to be provided visit 2 as appropriate (12/19/2021); continues to participate well (01/30/2022);   ? Time 12   ? Period Weeks   ? Status In-Progress  ?  ? PT LONG TERM GOAL #2  ? Title Demonstrate improved FOTO score by 10 units to demonstrate improvement in overall condition and self-reported functional ability.   ? Baseline to be measured at visit 2 as appropriate, patient needs assistance (11/21/2021); 38 at visit#2 (12/26/2021); 37 at visit#5 (01/04/2022); 46 at visit#9 (01/30/22);   ?  Time 12   ? Period Weeks   ? Status In-Progress  ?  ? PT LONG TERM GOAL #3  ? Title Pt will increase UE strength of by at least 1/2 MMT grade in order to demonstrate improvement in strength and function.   ? Baseline ranges from 3/5 to 4+/5 - see objective exam (12/19/2021); significantly improved strength and firmness against resistance at shoulder and elbow - still some difficulty  holding and weakness in hands and forearm - see objective exam (01/30/22);  ? Time 12   ? Period Weeks   ? Status In-Progress  ?  ? PT LONG TERM GOAL #4  ? Title Improve cervical AROM rotation to equal or greater than 45  degrees each direction and cervical extension to 15 degrees to improve ability to check blind spot when driving, view surroundings, and allow patient to complete valued activities with less difficulty.   ? Baseline Extension: 0

## 2022-02-21 ENCOUNTER — Ambulatory Visit: Payer: Medicaid Other | Admitting: Physical Therapy

## 2022-02-21 ENCOUNTER — Encounter: Payer: Self-pay | Admitting: Physical Therapy

## 2022-02-21 DIAGNOSIS — M6281 Muscle weakness (generalized): Secondary | ICD-10-CM

## 2022-02-21 DIAGNOSIS — M5412 Radiculopathy, cervical region: Secondary | ICD-10-CM | POA: Diagnosis not present

## 2022-02-21 DIAGNOSIS — M62838 Other muscle spasm: Secondary | ICD-10-CM | POA: Diagnosis not present

## 2022-02-21 DIAGNOSIS — M542 Cervicalgia: Secondary | ICD-10-CM

## 2022-02-21 DIAGNOSIS — R293 Abnormal posture: Secondary | ICD-10-CM

## 2022-02-21 NOTE — Therapy (Addendum)
?OUTPATIENT PHYSICAL THERAPY TREATMENT NOTE  ? ? ?Patient Name: Cory Rivas ?MRN: [id number] ?DOB:[birth year], 56 y.o., male ?Today's Date: 02/21/2022 ? ?PCP: Delsa Grana, PA-C ?REFERRING PROVIDER: Hazeline Junker, PA ? ? PT End of Session - 02/21/22 0901   ? ? Visit Number 12   ? Number of Visits 24   ? Date for PT Re-Evaluation 04/24/22   ? Authorization Type [place] MEDICAID HEALTHY BLUE reporting period from 02/12/2022   ? Authorization Time Period HB [id number] 3/14-4/20 12 PT visits; HB Auth# [id number] 4/25-6/2 12 PT visits   ? Authorization - Visit Number 11   ? Authorization - Number of Visits 20   ? Progress Note Due on Visit 20   ? PT Start Time 0900   ? PT Stop Time 0940   ? PT Time Calculation (min) 40 min   ? Activity Tolerance Patient tolerated treatment well   ? Behavior During Therapy [facility] for tasks assessed/performed   ? ?  ?  ? ?  ? ? ? ? ? ? ? ? ? ?Past Medical History:  ?Diagnosis Date  ? Bulging of cervical intervertebral disc   ? Diabetes mellitus without complication ([place])   ? Gout   ? High cholesterol   ? Hypertension   ? Stress due to family tension 04/15/2019  ? ?Past Surgical History:  ?Procedure Laterality Date  ? CHOLECYSTECTOMY    ? COLONOSCOPY WITH PROPOFOL N/A 03/10/2020  ? Procedure: COLONOSCOPY WITH PROPOFOL;  Surgeon: Lucilla Lame, MD;  Location: [facility] ENDOSCOPY;  Service: Endoscopy;  Laterality: N/A;  ? ?Patient Active Problem List  ? Diagnosis Date Noted  ? S/P cervical spinal fusion 08/18/2021  ? Former heavy tobacco smoker 08/18/2021  ? Gout 08/18/2021  ? Other chronic pain 10/26/2020  ? Systolic murmur 09/98/3382  ? Tobacco use 10/26/2020  ? Polyp of ascending colon   ? Rectal polyp   ? Class 2 severe obesity with serious comorbidity and body mass index (BMI) of 39.0 to 39.9 in adult [facility]) 04/15/2019  ? Chronic kidney disease (CKD) stage G2/A3, mildly decreased glomerular filtration rate (GFR) between 60-89 mL/min/1.73 square meter and albuminuria creatinine ratio greater than 300  mg/g 04/15/2019  ? Proteinuria 04/15/2019  ? Hyperlipidemia associated with type 2 diabetes mellitus ([place]) 03/18/2019  ? Diabetes mellitus ([place]) 03/18/2019  ? Idiopathic chronic gout of multiple sites without tophus 03/18/2019  ? Essential hypertension 03/18/2019  ? Dermatitis 03/18/2019  ? Lumbar degenerative disc disease 11/22/2015  ? Cervical myelopathy ([place]) 11/22/2015  ? Osteoarthritis of spine with radiculopathy, lumbar region 11/22/2015  ? ? ?REFERRING DIAG: s/p cervical spine fusion, cervical myelopathy ? ?THERAPY DIAG:  ?Cervicalgia ? ?Radiculopathy, cervical region ? ?Muscle weakness (generalized) ? ?Other muscle spasm ? ?Abnormal posture ? ?PERTINENT HISTORY: Patient is a 56 y.o. male who presents to outpatient physical therapy with a referral for medical diagnosis cervical spine fusion, cervical myelopathy. This patient's chief complaints consist of tightness, pain, restricted ROM at the cervical spine and back of head and B UE pain/paresthesia leading to the following functional deficits: difficulty with physical activities, prolonged sitting or standing, using B UE, picking things up, reaching, yardwork, using tractor, driving, riding in vehicle, turning his head, viewing his surroundings, traveling, rolling over at night, sleeping on his side, dressing, ADLs, IADLs. Relevant past medical history and comorbidities include cervical myelopathy leading to C2-6 laminectomy and C2-T2 fusion performed 02/01/2021, HTN, DMII, hyperlipidemia, lumbar degenerative disc disease, current smoker, obesity, systolic murmur, chronic kidney disease, cervical myelopathy,  gout, diverticulitis. Patient does not read/write well and needs assistance with completing paperwork. ? ?PRECAUTIONS: don't lift over a certain amount of weight (unsure but thinks maybe 10#?), bending over, picking heavy stuff up (he does not know if/when these precautions expire. He was told not to bend over or pick up anything heavy. ? ? ?SUBJECTIVE:  Patient reports he has a small spot on the back of his head that hurts when it is touched and is described as a stinging. He has continued to use his tractor and allergies have been bothering him. HEP is going pretty good. He has not eaten yet and he is hungry.  ? ?PAIN:  ?Are you having pain? No. Still has numbness in the 2nd and 3rd digits of the right fingers. Neck feels a little stiff.  ? ?OBJECTIVE   ?  ?TODAY'S TREATMENT:  ? ?Therapeutic exercise: to centralize symptoms and improve ROM, strength, muscular endurance, and activity tolerance required for successful completion of functional activities.  ?- NuStep level 6 using bilateral upper and lower extremities. Seat/handle setting 7/6. For improved extremity mobility, muscular endurance, and activity tolerance; and to induce the analgesic effect of aerobic exercise, stimulate improved joint nutrition, and prepare body structures and systems for following interventions. x 5 minutes. Average SPM = 99. ?- seated overhead press, 3x10 with 7#DB in each hand.  ?- seated row at [place] with wide grip, chest braced against seat, 3x10 with 45#. Pillow folded over at chest.  ?- seated (pillow folded at chest) lat pull at [facility] machine, 1x5 with 45#, 3x10 with 40#. (Discomfort over shoulders and in right hand).  ?- seated chest press with plus at [place], 3x10/10/15 with 20#. (Mild left elbow discomfort).  ? ?Increased UE paresthesia and discomfort at end of exercises ? ?Manual therapy: to reduce pain and tissue tension, improve range of motion, neuromodulation, in order to promote improved ability to complete functional activities. ?PRONE ?- CPA grade III-IV to upper thoracic to lumbar spine, 10-40 reps per segment ?HOOKLYING with feet elevated ?- STM to posterior cervical spine musculature focusing on B suboccipital and UT.  ?- PROM rotation with overpressure/oscillations as tolerated each direction focusing on upper cervical spine. 1x10 each side with 10  second hold/oscillations.   ? ?Pt required multimodal cuing for proper technique and to facilitate improved neuromuscular control, strength, range of motion, and functional ability resulting in improved performance and form. ?  ?  ?HOME EXERCISE PROGRAM: ?Access Code: [email or web address] ?URL: [email or web address] ?Date: 12/26/2021 ?Prepared by: Rosita Kea ?  ?Exercises ?Seated Assisted Cervical Rotation with Towel - 2 x daily - 2 sets - 10 reps - 5 seconds hold ?Standing Median Nerve Glide - 2 x daily - 1 sets - 15 reps ?  ?PATIENT EDUCATION: ?Education details: Exercise purpose/form, POC, progress.  ?Person educated: Patient ?Education method: Explanation, Demonstration, Tactile cues, and Verbal cues ?Education comprehension: verbalized understanding, returned demonstration, verbal cues required, tactile cues required, and needs further education ? ? ? PT Short Term Goals   ? ?  ? PT SHORT TERM GOAL #1  ? Title Be independent with initial home exercise program for self-management of symptoms.   ? Baseline Initial HEP to be provided visit 2 as appropriate (12/19/2021);   ? Time 2   ? Period Weeks   ? Status Achieved   ? Target Date 01/02/22   ? ?  ?  ? ?  ? ? ? PT Long Term Goals - 12/19/21 1112   ? TARGET DATE  FOR ALL LONG TERM GOALS: 03/13/2022. UPDATED TO TARGET DATE OF 04/24/2022 FOR ALL UNMET GOALS (01/30/2022); ?  ? PT LONG TERM GOAL #1  ? Title Be independent with a long-term home exercise program for self-management of symptoms.   ? Baseline Initial HEP to be provided visit 2 as appropriate (12/19/2021); continues to participate well (01/30/2022);   ? Time 12   ? Period Weeks   ? Status In-Progress  ?  ? PT LONG TERM GOAL #2  ? Title Demonstrate improved FOTO score by 10 units to demonstrate improvement in overall condition and self-reported functional ability.   ? Baseline to be measured at visit 2 as appropriate, patient needs assistance (11/21/2021); 38 at visit#2 (12/26/2021); 37 at visit#5 (01/04/2022); 46 at  visit#9 (01/30/22);   ? Time 12   ? Period Weeks   ? Status In-Progress  ?  ? PT LONG TERM GOAL #3  ? Title Pt will increase UE strength of by at least 1/2 MMT grade in order to demonstrate improvement in

## 2022-02-22 NOTE — Therapy (Signed)
?OUTPATIENT PHYSICAL THERAPY TREATMENT NOTE  ? ? ?Patient Name: Cory Rivas ?MRN: [id number] ?DOB:Nov 02, 1965, 55 y.o., male ?Today's Date: 02/26/2022 ? ?PCP: Delsa Grana, PA-C ?REFERRING PROVIDER: Hazeline Junker, PA ? ? PT End of Session - 02/26/22 1213   ? ? Visit Number 13   ? Number of Visits 24   ? Date for PT Re-Evaluation 04/24/22   ? Authorization Type [place] MEDICAID HEALTHY BLUE reporting period from 02/12/2022   ? Authorization Time Period HB [id number] 3/14-4/20 12 PT visits; HB Auth# [id number] 4/25-6/2 12 PT visits   ? Authorization - Visit Number 12   ? Authorization - Number of Visits 20   ? Progress Note Due on Visit 20   ? PT Start Time 1128   ? PT Stop Time 1213   ? PT Time Calculation (min) 45 min   ? Activity Tolerance Patient tolerated treatment well   ? Behavior During Therapy [facility] for tasks assessed/performed   ? ?  ?  ? ?  ? ? ? ? ? ? ? ? ? ? ?Past Medical History:  ?Diagnosis Date  ? Bulging of cervical intervertebral disc   ? Diabetes mellitus without complication ([place])   ? Gout   ? High cholesterol   ? Hypertension   ? Stress due to family tension 04/15/2019  ? ?Past Surgical History:  ?Procedure Laterality Date  ? CHOLECYSTECTOMY    ? COLONOSCOPY WITH PROPOFOL N/A 03/10/2020  ? Procedure: COLONOSCOPY WITH PROPOFOL;  Surgeon: Lucilla Lame, MD;  Location: [facility] ENDOSCOPY;  Service: Endoscopy;  Laterality: N/A;  ? ?Patient Active Problem List  ? Diagnosis Date Noted  ? S/P cervical spinal fusion 08/18/2021  ? Former heavy tobacco smoker 08/18/2021  ? Gout 08/18/2021  ? Other chronic pain 10/26/2020  ? Systolic murmur 17/00/1749  ? Tobacco use 10/26/2020  ? Polyp of ascending colon   ? Rectal polyp   ? Class 2 severe obesity with serious comorbidity and body mass index (BMI) of 39.0 to 39.9 in adult [facility]) 04/15/2019  ? Chronic kidney disease (CKD) stage G2/A3, mildly decreased glomerular filtration rate (GFR) between 60-89 mL/min/1.73 square meter and albuminuria creatinine ratio greater than 300  mg/g 04/15/2019  ? Proteinuria 04/15/2019  ? Hyperlipidemia associated with type 2 diabetes mellitus ([place]) 03/18/2019  ? Diabetes mellitus ([place]) 03/18/2019  ? Idiopathic chronic gout of multiple sites without tophus 03/18/2019  ? Essential hypertension 03/18/2019  ? Dermatitis 03/18/2019  ? Lumbar degenerative disc disease 11/22/2015  ? Cervical myelopathy ([place]) 11/22/2015  ? Osteoarthritis of spine with radiculopathy, lumbar region 11/22/2015  ? ? ?REFERRING DIAG: s/p cervical spine fusion, cervical myelopathy ? ?THERAPY DIAG:  ?Cervicalgia ? ?Radiculopathy, cervical region ? ?Muscle weakness (generalized) ? ?Other muscle spasm ? ?Abnormal posture ? ?PERTINENT HISTORY: Patient is a 56 y.o. male who presents to outpatient physical therapy with a referral for medical diagnosis cervical spine fusion, cervical myelopathy. This patient's chief complaints consist of tightness, pain, restricted ROM at the cervical spine and back of head and B UE pain/paresthesia leading to the following functional deficits: difficulty with physical activities, prolonged sitting or standing, using B UE, picking things up, reaching, yardwork, using tractor, driving, riding in vehicle, turning his head, viewing his surroundings, traveling, rolling over at night, sleeping on his side, dressing, ADLs, IADLs. Relevant past medical history and comorbidities include cervical myelopathy leading to C2-6 laminectomy and C2-T2 fusion performed 02/01/2021, HTN, DMII, hyperlipidemia, lumbar degenerative disc disease, current smoker, obesity, systolic murmur, chronic kidney disease, cervical  myelopathy, gout, diverticulitis. Patient does not read/write well and needs assistance with completing paperwork. ? ?PRECAUTIONS: don't lift over a certain amount of weight (unsure but thinks maybe 10#?), bending over, picking heavy stuff up (he does not know if/when these precautions expire. He was told not to bend over or pick up anything heavy. ? ? ?SUBJECTIVE:  Patient reports he is feeling tired today after not sleeping well. He states he was thinking about a bolt that broke on his tractor when brushhogging on Saturday. He states his neck was alright until last night when it got cold in his room and he felt pain in the left neck and increased feeling of tightness. Also notices slightly increased R hand weakness. He also thinks the cold effected his knees.  ? ?PAIN:  ?Are you having pain? Yes bilateral knee pain. Still has numbness in the 2nd and 3rd digits of the right fingers. Neck feels pretty stiff.  ? ?OBJECTIVE   ?  ?TODAY'S TREATMENT:  ? ?Therapeutic exercise: to centralize symptoms and improve ROM, strength, muscular endurance, and activity tolerance required for successful completion of functional activities.  ?- NuStep level 6 using bilateral upper and lower extremities. Seat/handle setting 7/6. For improved extremity mobility, muscular endurance, and activity tolerance; and to induce the analgesic effect of aerobic exercise, stimulate improved joint nutrition, and prepare body structures and systems for following interventions. x 5 minutes. Average SPM = 99. ?- seated overhead press, 1x9 with 7#DB in each hand. (Partial ROM, weakness/pain, discontinued).  ?(Manual - see below).  ?- standing shrugs, 2x10 with 7# DB in each hand.  ?- seated row at [place] with wide grip, chest braced against seat, 2x10 with 40#. Pillow folded over at chest.  ?- seated (pillow folded at chest) lat pull at [facility] machine, 2x10 with 40#.  ?- seated chest press with plus at [place], 2x10 with 20#. (Mild left elbow discomfort/popping).  ? ?Manual therapy: to reduce pain and tissue tension, improve range of motion, neuromodulation, in order to promote improved ability to complete functional activities. ?HOOKLYING with feet elevated ?- STM to posterior cervical spine musculature focusing on B suboccipital and UT.  ?- PROM rotation with overpressure/oscillations as tolerated  each direction focusing on upper cervical spine. 1x10 each side with 10 second hold/oscillations.   ? ?Pt required multimodal cuing for proper technique and to facilitate improved neuromuscular control, strength, range of motion, and functional ability resulting in improved performance and form. ?  ?  ?HOME EXERCISE PROGRAM: ?Access Code: [email or web address] ?URL: [email or web address] ?Date: 12/26/2021 ?Prepared by: Rosita Kea ?  ?Exercises ?Seated Assisted Cervical Rotation with Towel - 2 x daily - 2 sets - 10 reps - 5 seconds hold ?Standing Median Nerve Glide - 2 x daily - 1 sets - 15 reps ?  ?PATIENT EDUCATION: ?Education details: Exercise purpose/form, POC, progress.  ?Person educated: Patient ?Education method: Explanation, Demonstration, Tactile cues, and Verbal cues ?Education comprehension: verbalized understanding, returned demonstration, verbal cues required, tactile cues required, and needs further education ? ? ? PT Short Term Goals   ? ?  ? PT SHORT TERM GOAL #1  ? Title Be independent with initial home exercise program for self-management of symptoms.   ? Baseline Initial HEP to be provided visit 2 as appropriate (12/19/2021);   ? Time 2   ? Period Weeks   ? Status Achieved   ? Target Date 01/02/22   ? ?  ?  ? ?  ? ? ? PT Long Term Goals -  12/19/21 1112   ? TARGET DATE FOR ALL LONG TERM GOALS: 03/13/2022. UPDATED TO TARGET DATE OF 04/24/2022 FOR ALL UNMET GOALS (01/30/2022); ?  ? PT LONG TERM GOAL #1  ? Title Be independent with a long-term home exercise program for self-management of symptoms.   ? Baseline Initial HEP to be provided visit 2 as appropriate (12/19/2021); continues to participate well (01/30/2022);   ? Time 12   ? Period Weeks   ? Status In-Progress  ?  ? PT LONG TERM GOAL #2  ? Title Demonstrate improved FOTO score by 10 units to demonstrate improvement in overall condition and self-reported functional ability.   ? Baseline to be measured at visit 2 as appropriate, patient needs assistance  (11/21/2021); 38 at visit#2 (12/26/2021); 37 at visit#5 (01/04/2022); 46 at visit#9 (01/30/22);   ? Time 12   ? Period Weeks   ? Status In-Progress  ?  ? PT LONG TERM GOAL #3  ? Title Pt will increase UE stre

## 2022-02-26 ENCOUNTER — Ambulatory Visit: Payer: Medicaid Other | Admitting: Physical Therapy

## 2022-02-26 ENCOUNTER — Encounter: Payer: Self-pay | Admitting: Physical Therapy

## 2022-02-26 DIAGNOSIS — M6281 Muscle weakness (generalized): Secondary | ICD-10-CM

## 2022-02-26 DIAGNOSIS — R293 Abnormal posture: Secondary | ICD-10-CM | POA: Diagnosis not present

## 2022-02-26 DIAGNOSIS — M5412 Radiculopathy, cervical region: Secondary | ICD-10-CM | POA: Diagnosis not present

## 2022-02-26 DIAGNOSIS — M542 Cervicalgia: Secondary | ICD-10-CM

## 2022-02-26 DIAGNOSIS — M62838 Other muscle spasm: Secondary | ICD-10-CM

## 2022-02-28 ENCOUNTER — Encounter: Payer: Medicaid Other | Admitting: Physical Therapy

## 2022-03-01 ENCOUNTER — Ambulatory Visit: Payer: Medicaid Other | Admitting: Podiatry

## 2022-03-05 ENCOUNTER — Ambulatory Visit: Payer: Medicaid Other | Admitting: Family Medicine

## 2022-03-06 ENCOUNTER — Ambulatory Visit: Payer: Medicaid Other | Admitting: Family Medicine

## 2022-03-06 ENCOUNTER — Encounter: Payer: Self-pay | Admitting: Family Medicine

## 2022-03-06 VITALS — BP 168/112 | HR 77 | Temp 98.8°F | Resp 18 | Ht 67.0 in | Wt 241.7 lb

## 2022-03-06 DIAGNOSIS — J3089 Other allergic rhinitis: Secondary | ICD-10-CM | POA: Diagnosis not present

## 2022-03-06 DIAGNOSIS — M109 Gout, unspecified: Secondary | ICD-10-CM | POA: Diagnosis not present

## 2022-03-06 DIAGNOSIS — M5412 Radiculopathy, cervical region: Secondary | ICD-10-CM | POA: Diagnosis not present

## 2022-03-06 DIAGNOSIS — G959 Disease of spinal cord, unspecified: Secondary | ICD-10-CM | POA: Diagnosis not present

## 2022-03-06 DIAGNOSIS — I10 Essential (primary) hypertension: Secondary | ICD-10-CM | POA: Diagnosis not present

## 2022-03-06 DIAGNOSIS — M549 Dorsalgia, unspecified: Secondary | ICD-10-CM

## 2022-03-06 DIAGNOSIS — E1169 Type 2 diabetes mellitus with other specified complication: Secondary | ICD-10-CM | POA: Diagnosis not present

## 2022-03-06 DIAGNOSIS — E785 Hyperlipidemia, unspecified: Secondary | ICD-10-CM

## 2022-03-06 DIAGNOSIS — G8929 Other chronic pain: Secondary | ICD-10-CM | POA: Diagnosis not present

## 2022-03-06 DIAGNOSIS — M5136 Other intervertebral disc degeneration, lumbar region: Secondary | ICD-10-CM | POA: Diagnosis not present

## 2022-03-06 DIAGNOSIS — M51369 Other intervertebral disc degeneration, lumbar region without mention of lumbar back pain or lower extremity pain: Secondary | ICD-10-CM

## 2022-03-06 MED ORDER — EZETIMIBE 10 MG PO TABS: 10.0000 mg | ORAL_TABLET | Freq: Every day | ORAL | 3 refills | Status: DC

## 2022-03-06 MED ORDER — BACLOFEN 5 MG PO TABS
5.0000 mg | ORAL_TABLET | Freq: Three times a day (TID) | ORAL | 3 refills | Status: DC | PRN
Start: 2022-03-06 — End: 2022-08-30

## 2022-03-06 MED ORDER — DULOXETINE HCL 30 MG PO CPEP: 30.0000 mg | ORAL_CAPSULE | Freq: Two times a day (BID) | ORAL | 1 refills | Status: DC

## 2022-03-06 MED ORDER — METFORMIN HCL 1000 MG PO TABS: ORAL_TABLET | ORAL | 3 refills | Status: DC

## 2022-03-06 MED ORDER — LISINOPRIL 40 MG PO TABS: 40.0000 mg | ORAL_TABLET | Freq: Every day | ORAL | 3 refills | Status: DC

## 2022-03-06 NOTE — Assessment & Plan Note (Signed)
Uncontrolled today, pt hasn't taken meds Take meds daily, 2 week f/up for BP recheck

## 2022-03-06 NOTE — Assessment & Plan Note (Signed)
Last lipids poorly controlled, recheck labs with crestor and zetia

## 2022-03-06 NOTE — Assessment & Plan Note (Signed)
Well controlled with current allopurinol dose, no recent flares

## 2022-03-06 NOTE — Assessment & Plan Note (Signed)
A1C at goal, per [facility], on insulin, dexcom, metformin, GLP-1 Foot care UTD and managed by specialist On statin and ACEI Urged better med compliance

## 2022-03-06 NOTE — Progress Notes (Signed)
Name: Cory Rivas   MRN: [id number]    DOB: 1966-05-11   Date:03/06/2022       Progress Note  Chief Complaint  Patient presents with   Follow-up   Hypertension   Diabetes     Subjective:   Cory Rivas is a 56 y.o. male, presents to clinic for routine f/up  Hypertension:  Currently managed on lisinopril 40 and norvasc 10 - has been off BP meds for almost a week - left them on accident when he went out of town Pt reports otherwise good med compliance and denies any SE.   Blood pressure today is uncontrolled. BP Readings from Last 3 Encounters:  03/06/22 (!) 168/112  01/29/22 (!) 162/82  01/22/22 138/86   Pt denies CP, SOB, exertional sx, LE edema, palpitation, Ha's, visual disturbances, lightheadedness, hypotension, syncope.   DM:  [facility] - insulin, GLP-1, sees foot specialist, on dexcom - A1C recently got to goal Denies: Polyuria, polydipsia, vision changes, neuropathy, hypoglycemia Recent pertinent labs: Lab Results  Component Value Date   HGBA1C 6.5 01/02/2022   HGBA1C 6.6 (H) 12/04/2021   HGBA1C 11.0 06/29/2021   Lab Results  Component Value Date   MICROALBUR 163.6 07/01/2020   LDLCALC 166 (H) 12/04/2021   CREATININE 1.41 (H) 12/07/2021   Standard of care and health maintenance: Urine Microalbumin:  n/a Foot exam:  done DM eye exam:  utd ACEI/ARB:  yes - lisinopril Statin:  crestor  Hyperlipidemia: Currently treated with crestor and zetia, pt reports good med compliance - except for the past 7 days Lipids last OV uncontrolled and elevated Last Lipids: Lab Results  Component Value Date   CHOL 248 (H) 12/04/2021   HDL 37 (L) 12/04/2021   LDLCALC 166 (H) 12/04/2021   LDLDIRECT 135 (H) 08/18/2021   TRIG 274 (H) 12/04/2021   CHOLHDL 6.7 (H) 12/04/2021   - Denies: Chest pain, shortness of breath, myalgias, claudication  Chronic neck pain  Allergies - eyes, sinuses - not on all the meds sent in - has eye drops but not antihistamine and nasal  spray  Gout - no recent flares on 400 mg allopurinol daily    Current Outpatient Medications:    acetaminophen (TYLENOL) 325 MG tablet, SMARTSIG:3 Tablet(s) By Mouth Every 8 Hours PRN, Disp: , Rfl:    allopurinol (ZYLOPRIM) 100 MG tablet, Take 1 tablet (100 mg total) by mouth daily. Take with 300 mg tab (for total of 400 mg once daily), Disp: 90 tablet, Rfl: 3   allopurinol (ZYLOPRIM) 300 MG tablet, Take one 300 mg tab po daily WITH 100 mg tab for total of 400 mg once daily for gout, Disp: 90 tablet, Rfl: 3   amLODipine (NORVASC) 10 MG tablet, Take 1 tablet (10 mg total) by mouth daily., Disp: 90 tablet, Rfl: 3   Continuous Blood Gluc Transmit (DEXCOM G6 TRANSMITTER) MISC, Use 1 each every 3 (three) months, Disp: , Rfl:    diphenhydrAMINE (BENADRYL ALLERGY) 25 MG tablet, Take 1 tablet (25 mg total) by mouth every 6 (six) hours as needed for allergies., Disp: 30 tablet, Rfl: 0   Dulaglutide 1.5 MG/0.5ML SOPN, Inject into the skin., Disp: , Rfl:    DULoxetine (CYMBALTA) 30 MG capsule, Take 1 capsule (30 mg total) by mouth 2 (two) times daily., Disp: 180 capsule, Rfl: 1   ezetimibe (ZETIA) 10 MG tablet, Take 1 tablet (10 mg total) by mouth daily., Disp: 90 tablet, Rfl: 1   ibuprofen (ADVIL) 800 MG tablet, Take  800 mg by mouth every 6 (six) hours as needed., Disp: , Rfl:    indomethacin (INDOCIN) 25 MG capsule, TAKE 2 TABLETS (50 MG) BY 3 TIMES DAILY FOR 2 DAYS AT ONSET OF GOUT FLARE UP, THEN TAKE 1('25MG'$ )TABLET BY MOUTH FOR 3 DAYS, Disp: 20 capsule, Rfl: 3   insulin lispro (HUMALOG) 100 UNIT/ML KwikPen, By correction scale based on pre-meal blood sugar. 2 units/ 50 over 150. TDD up to 45 units, Disp: , Rfl:    levocetirizine (XYZAL) 5 MG tablet, Take 1 tablet (5 mg total) by mouth every evening., Disp: 90 tablet, Rfl: 1   lisinopril (ZESTRIL) 40 MG tablet, Take 1 tablet (40 mg total) by mouth daily., Disp: 90 tablet, Rfl: 0   metFORMIN (GLUCOPHAGE) 1000 MG tablet, TAKE 1 TABLET BY MOUTH TWICE A DAY  WITH A MEAL, Disp: 60 tablet, Rfl: 1   methocarbamol (ROBAXIN) 500 MG tablet, Take 500 mg by mouth 2 (two) times daily., Disp: , Rfl:    mometasone (NASONEX) 50 MCG/ACT nasal spray, Place 2 sprays into the nose daily., Disp: 1 each, Rfl: 12   Olopatadine HCl 0.2 % SOLN, Apply 1 drop to eye 2 (two) times daily as needed (eye allergies)., Disp: 2.5 mL, Rfl: 3   omeprazole (PRILOSEC) 40 MG capsule, Take 1 capsule (40 mg total) by mouth daily., Disp: 30 capsule, Rfl: 3   pregabalin (LYRICA) 25 MG capsule, TAKE 1 CAPSULE BY MOUTH IN THE MORNING AND 1 TO 3 CAPSULES EVERY NIGHT FOR CERVICAL RADICULOPATHY AND NEUROPATHY, Disp: 270 capsule, Rfl: 2   rosuvastatin (CRESTOR) 40 MG tablet, Take 1 tablet (40 mg total) by mouth daily., Disp: 90 tablet, Rfl: 3   triamcinolone ointment (KENALOG) 0.5 %, Apply 1 application. topically 2 (two) times daily., Disp: 30 g, Rfl: 0   amoxicillin (AMOXIL) 500 MG capsule, Take 500 mg by mouth 3 (three) times daily. (Patient not taking: Reported on 03/06/2022), Disp: , Rfl:    Continuous Blood Gluc Receiver (FREESTYLE LIBRE 2 READER) DEVI, , Disp: , Rfl:    Continuous Blood Gluc Sensor (FREESTYLE LIBRE 2 SENSOR) MISC, , Disp: , Rfl:    ibuprofen (ADVIL) 200 MG tablet, Take by mouth. (Patient not taking: Reported on 03/06/2022), Disp: , Rfl:    insulin glargine (LANTUS SOLOSTAR) 100 UNIT/ML Solostar Pen, Inject into the skin., Disp: , Rfl:    ondansetron (ZOFRAN-ODT) 8 MG disintegrating tablet, Take 1 tablet (8 mg total) by mouth every 8 (eight) hours as needed for nausea or vomiting. (Patient not taking: Reported on 03/06/2022), Disp: 20 tablet, Rfl: 0  Patient Active Problem List   Diagnosis Date Noted   S/P cervical spinal fusion 08/18/2021   Former heavy tobacco smoker 08/18/2021   Gout 08/18/2021   Other chronic pain 70/62/3762   Systolic murmur 83/15/1761   Tobacco use 10/26/2020   Polyp of ascending colon    Rectal polyp    Class 2 severe obesity with serious  comorbidity and body mass index (BMI) of 39.0 to 39.9 in adult [facility]) 04/15/2019   Chronic kidney disease (CKD) stage G2/A3, mildly decreased glomerular filtration rate (GFR) between 60-89 mL/min/1.73 square meter and albuminuria creatinine ratio greater than 300 mg/g 04/15/2019   Proteinuria 04/15/2019   Hyperlipidemia associated with type 2 diabetes mellitus ([place]) 03/18/2019   Diabetes mellitus ([place]) 03/18/2019   Idiopathic chronic gout of multiple sites without tophus 03/18/2019   Essential hypertension 03/18/2019   Dermatitis 03/18/2019   Lumbar degenerative disc disease 11/22/2015   Cervical myelopathy (  [place]) 11/22/2015   Osteoarthritis of spine with radiculopathy, lumbar region 11/22/2015    Past Surgical History:  Procedure Laterality Date   CHOLECYSTECTOMY     COLONOSCOPY WITH PROPOFOL N/A 03/10/2020   Procedure: COLONOSCOPY WITH PROPOFOL;  Surgeon: Lucilla Lame, MD;  Location: [facility] ENDOSCOPY;  Service: Endoscopy;  Laterality: N/A;    Family History  Problem Relation Age of Onset   Heart disease Mother    Diabetes Mother    Hyperlipidemia Mother    Hypertension Mother    Heart attack Mother    Lung cancer Father    Hypertension Father    Stroke Father    AAA (abdominal aortic aneurysm) Maternal Grandmother    Heart attack Maternal Grandmother    Diabetes Maternal Grandfather     Social History   Tobacco Use   Smoking status: Every Day    Packs/day: 1.00    Years: 37.00    Pack years: 37.00    Types: Cigarettes    Start date: 10/19/1989   Smokeless tobacco: Never   Tobacco comments:    30+ years hx smoking 2 ppd, stopped smoking briefly in April 2022 for a surgery and then restarted smoking about 1/2 ppd last 4-5 months   Vaping Use   Vaping Use: Never used  Substance Use Topics   Alcohol use: No   Drug use: No     No Known Allergies  Health Maintenance  Topic Date Due   Zoster Vaccines- Shingrix (1 of 2) 03/06/2022 (Originally 02/12/2017)   COVID-19 Vaccine  (4 - Booster for Pfizer series) 03/22/2022 (Originally 09/07/2020)   INFLUENZA VACCINE  05/15/2022   FOOT EXAM  05/25/2022   HEMOGLOBIN A1C  07/05/2022   OPHTHALMOLOGY EXAM  02/06/2023   COLONOSCOPY (Pts 45-4yr Insurance coverage will need to be confirmed)  03/11/2023   TETANUS/TDAP  04/14/2029   Hepatitis C Screening  Completed   HIV Screening  Completed   HPV VACCINES  Aged Out    Chart Review Today: I personally reviewed active problem list, medication list, allergies, family history, social history, health maintenance, notes from last encounter, lab results, imaging with the patient/caregiver today.   Review of Systems  Constitutional: Negative.   HENT: Negative.    Eyes: Negative.   Respiratory: Negative.    Cardiovascular: Negative.   Gastrointestinal: Negative.   Endocrine: Negative.   Genitourinary: Negative.   Musculoskeletal: Negative.   Skin: Negative.   Allergic/Immunologic: Negative.   Neurological: Negative.   Hematological: Negative.   Psychiatric/Behavioral: Negative.    All other systems reviewed and are negative.   Objective:   Vitals:   03/06/22 1459 03/06/22 1513  BP: (!) 164/108 (!) 168/112  Pulse: 77   Resp: 18   Temp: 98.8 F (37.1 C)   TempSrc: Oral   SpO2: 95%   Weight: 241 lb 11.2 oz (109.6 kg)   Height: '5\' 7"'$  (1.702 m)     Body mass index is 37.86 kg/m.  Physical Exam Vitals and nursing note reviewed.  Constitutional:      General: He is not in acute distress.    Appearance: Normal appearance. He is well-developed. He is obese. He is not ill-appearing, toxic-appearing or diaphoretic.     Interventions: Face mask in place.  HENT:     Head: Normocephalic and atraumatic.     Jaw: No trismus.     Right Ear: External ear normal.     Left Ear: External ear normal.  Eyes:     General: Lids are  normal. No scleral icterus.       Right eye: No discharge.        Left eye: No discharge.     Comments: Bilateral conjunctiva mildly  injected  Neck:     Trachea: Trachea and phonation normal. No tracheal deviation.  Cardiovascular:     Rate and Rhythm: Normal rate and regular rhythm.     Pulses:          Radial pulses are 2+ on the right side and 2+ on the left side.       Posterior tibial pulses are 2+ on the right side and 2+ on the left side.     Heart sounds: Normal heart sounds. No murmur heard.   No friction rub. No gallop.  Pulmonary:     Effort: Pulmonary effort is normal. No respiratory distress.     Breath sounds: Normal breath sounds. No stridor. No wheezing, rhonchi or rales.  Abdominal:     General: Bowel sounds are normal. There is no distension.     Palpations: Abdomen is soft.  Musculoskeletal:     Cervical back: Rigidity present.     Right lower leg: No edema.     Left lower leg: No edema.  Skin:    General: Skin is warm and dry.     Coloration: Skin is not jaundiced or pale.     Findings: No rash.     Nails: There is no clubbing.  Neurological:     Mental Status: He is alert. Mental status is at baseline.     Cranial Nerves: No dysarthria or facial asymmetry.     Motor: No tremor or abnormal muscle tone.     Gait: Gait normal.  Psychiatric:        Mood and Affect: Mood normal.        Speech: Speech normal.        Behavior: Behavior normal. Behavior is cooperative.        Assessment & Plan:   Problem List Items Addressed This Visit       Cardiovascular and Mediastinum   Essential hypertension    Uncontrolled today, pt hasn't taken meds Take meds daily, 2 week f/up for BP recheck       Relevant Medications   lisinopril (ZESTRIL) 40 MG tablet   ezetimibe (ZETIA) 10 MG tablet   Other Relevant Orders   COMPLETE METABOLIC PANEL WITH GFR     Endocrine   Hyperlipidemia associated with type 2 diabetes mellitus ([place]) - Primary    Last lipids poorly controlled, recheck labs with crestor and zetia        Relevant Medications   lisinopril (ZESTRIL) 40 MG tablet   metFORMIN  (GLUCOPHAGE) 1000 MG tablet   ezetimibe (ZETIA) 10 MG tablet   Other Relevant Orders   COMPLETE METABOLIC PANEL WITH GFR   Lipid panel   Diabetes mellitus (HCC)    A1C at goal, per [facility], on insulin, dexcom, metformin, GLP-1 Foot care UTD and managed by specialist On statin and ACEI Urged better med compliance        Relevant Medications   lisinopril (ZESTRIL) 40 MG tablet   metFORMIN (GLUCOPHAGE) 1000 MG tablet   Other Relevant Orders   COMPLETE METABOLIC PANEL WITH GFR     Nervous and Auditory   Cervical myelopathy (HCC)   Relevant Medications   DULoxetine (CYMBALTA) 30 MG capsule   Baclofen 5 MG TABS     Musculoskeletal and Integument   Lumbar  degenerative disc disease   Relevant Medications   ibuprofen (ADVIL) 800 MG tablet   DULoxetine (CYMBALTA) 30 MG capsule   Baclofen 5 MG TABS     Other   Gout    Well controlled with current allopurinol dose, no recent flares       Other Visit Diagnoses     Chronic back pain, unspecified back location, unspecified back pain laterality       per specialists   Relevant Medications   ibuprofen (ADVIL) 800 MG tablet   DULoxetine (CYMBALTA) 30 MG capsule   Baclofen 5 MG TABS   Radiculopathy, cervical       Relevant Medications   DULoxetine (CYMBALTA) 30 MG capsule   Baclofen 5 MG TABS   Chronic back pain, unspecified back location, unspecified back pain laterality       chronic neck and back pain fairly well controlled with cymbalta, change muscle relaxer, he hasn't been on lyrica for months - d/c   Relevant Medications   ibuprofen (ADVIL) 800 MG tablet   DULoxetine (CYMBALTA) 30 MG capsule   Baclofen 5 MG TABS   Environmental and seasonal allergies       get and take daily antihistamine, steroid nose spray - only doing eye drops        Return for 3-4 month routine f/up  - needs Nurse BP recheck in 2-3 weeks.   Delsa Grana, PA-C 03/06/22 3:15 PM

## 2022-03-07 LAB — LIPID PANEL
Cholesterol: 209 mg/dL — ABNORMAL HIGH (ref <200)
HDL: 33 mg/dL — ABNORMAL LOW (ref >=40)
LDL Cholesterol (Calc): 133 mg/dL (calc) — ABNORMAL HIGH
Non-HDL Cholesterol (Calc): 176 mg/dL (calc) — ABNORMAL HIGH (ref <130)
Total CHOL/HDL Ratio: 6.3 (calc) — ABNORMAL HIGH (ref <5.0)
Triglycerides: 280 mg/dL — ABNORMAL HIGH (ref <150)

## 2022-03-07 LAB — COMPLETE METABOLIC PANEL WITH GFR
AG Ratio: 1.4 (calc) (ref 1.0–2.5)
ALT: 15 U/L (ref 9–46)
AST: 15 U/L (ref 10–35)
Albumin: 3.9 g/dL (ref 3.6–5.1)
Alkaline phosphatase (APISO): 70 U/L (ref 35–144)
BUN: 15 mg/dL (ref 7–25)
CO2: 26 mmol/L (ref 20–32)
Calcium: 9 mg/dL (ref 8.6–10.3)
Chloride: 102 mmol/L (ref 98–110)
Creat: 1.19 mg/dL (ref 0.70–1.30)
Globulin: 2.8 g/dL (calc) (ref 1.9–3.7)
Glucose, Bld: 142 mg/dL — ABNORMAL HIGH (ref 65–99)
Potassium: 3.8 mmol/L (ref 3.5–5.3)
Sodium: 136 mmol/L (ref 135–146)
Total Bilirubin: 0.5 mg/dL (ref 0.2–1.2)
Total Protein: 6.7 g/dL (ref 6.1–8.1)
eGFR: 72 mL/min/{1.73_m2} (ref >=60)

## 2022-03-07 NOTE — Therapy (Incomplete)
OUTPATIENT PHYSICAL THERAPY TREATMENT NOTE    Patient Name: Cory Rivas MRN: [id number] DOB:1965/12/27, 56 y.o., male Today's Date: 03/08/2022  PCP: Delsa Grana, PA-C REFERRING PROVIDER: Hazeline Junker, PA   PT End of Session - 03/08/22 1353     Visit Number 14    Number of Visits 24    Date for PT Re-Evaluation 04/24/22    Authorization Type [place] MEDICAID HE[employer]Y BLUE reporting period from 02/12/2022    Authorization Time Period HB [id number] 3/14-4/20 12 PT visits; HB Auth# [id number] 4/25-6/2 12 PT visits    Authorization - Visit Number 41    Authorization - Number of Visits 20    Progress Note Due on Visit 20    PT Start Time 4270    PT Stop Time 1428    PT Time Calculation (min) 40 min    Activity Tolerance Patient tolerated treatment well    Behavior During Therapy [facility] for tasks assessed/performed                      Past Medical History:  Diagnosis Date   Bulging of cervical intervertebral disc    Diabetes mellitus without complication ([place])    Gout    High cholesterol    Hypertension    Stress due to family tension 04/15/2019   Past Surgical History:  Procedure Laterality Date   CHOLECYSTECTOMY     COLONOSCOPY WITH PROPOFOL N/A 03/10/2020   Procedure: COLONOSCOPY WITH PROPOFOL;  Surgeon: Lucilla Lame, MD;  Location: [facility] ENDOSCOPY;  Service: Endoscopy;  Laterality: N/A;   Patient Active Problem List   Diagnosis Date Noted   S/P cervical spinal fusion 08/18/2021   Former heavy tobacco smoker 08/18/2021   Gout 08/18/2021   Other chronic pain 62/37/6283   Systolic murmur 15/17/6160   Tobacco use 10/26/2020   Polyp of ascending colon    Rectal polyp    Class 2 severe obesity with serious comorbidity and body mass index (BMI) of 39.0 to 39.9 in adult [facility]) 04/15/2019   Chronic kidney disease (CKD) stage G2/A3, mildly decreased glomerular filtration rate (GFR) between 60-89 mL/min/1.73 square meter and albuminuria creatinine ratio greater than 300  mg/g 04/15/2019   Proteinuria 04/15/2019   Hyperlipidemia associated with type 2 diabetes mellitus ([place]) 03/18/2019   Diabetes mellitus ([place]) 03/18/2019   Idiopathic chronic gout of multiple sites without tophus 03/18/2019   Essential hypertension 03/18/2019   Dermatitis 03/18/2019   Lumbar degenerative disc disease 11/22/2015   Cervical myelopathy ([place]) 11/22/2015   Osteoarthritis of spine with radiculopathy, lumbar region 11/22/2015    REFERRING DIAG: s/p cervical spine fusion, cervical myelopathy  THERAPY DIAG:  Cervicalgia  Radiculopathy, cervical region  Muscle weakness (generalized)  Other muscle spasm  Abnormal posture  Rationale for Evaluation and Treatment: Rehabilitation  PERTINENT HISTORY: Patient is a 56 y.o. male who presents to outpatient physical therapy with a referral for medical diagnosis cervical spine fusion, cervical myelopathy. This patient's chief complaints consist of tightness, pain, restricted ROM at the cervical spine and back of head and B UE pain/paresthesia leading to the following functional deficits: difficulty with physical activities, prolonged sitting or standing, using B UE, picking things up, reaching, yardwork, using tractor, driving, riding in vehicle, turning his head, viewing his surroundings, traveling, rolling over at night, sleeping on his side, dressing, ADLs, IADLs. Relevant past medical history and comorbidities include cervical myelopathy leading to C2-6 laminectomy and C2-T2 fusion performed 02/01/2021, HTN, DMII, hyperlipidemia, lumbar degenerative disc disease, current  smoker, obesity, systolic murmur, chronic kidney disease, cervical myelopathy, gout, diverticulitis. Patient does not read/write well and needs assistance with completing paperwork.  PRECAUTIONS: don't lift over a certain amount of weight (unsure but thinks maybe 10#?), bending over, picking heavy stuff up (he does not know if/when these precautions expire. He was told not  to bend over or pick up anything heavy.   SUBJECTIVE: Patient reports he is feeling tired today. He has had a headache for about 3 days that is behind his eyes and at the base of his head near his posterior neck. He states his teeth were all pulled on Thursday and it went well. He is going back to get the top dentures adjusted because they keep falling out. He reports his neck feels tight and his headache is 3/10. States he had more pain and stiffness while he was away from PT to get his teeth pulled.   PAIN:  Are you having pain? Yes headache 3/10. Still has numbness in the 2nd and 3rd digits of the right fingers. Neck feels pretty stiff.   OBJECTIVE     TODAY'S TREATMENT:   Therapeutic exercise: to centralize symptoms and improve ROM, strength, muscular endurance, and activity tolerance required for successful completion of functional activities.  - NuStep level 6 using bilateral upper and lower extremities. Seat/handle setting 7/6. For improved extremity mobility, muscular endurance, and activity tolerance; and to induce the analgesic effect of aerobic exercise, stimulate improved joint nutrition, and prepare body structures and systems for following interventions. x 5 minutes. Average SPM = 96. (Manual therapy - see below) - standing shrugs, 2x10 with 7# DB in each hand.  - seated row at [place] with wide grip, chest braced against seat, 3x10 with 35#. Pillow folded over at chest.  - seated (pillow folded at chest) lat pull at [facility] machine, 2x10 with 40#.  - seated chest press with plus at [place], 2x10 with 20#. (Mild left elbow discomfort/popping).   Manual therapy: to reduce pain and tissue tension, improve range of motion, neuromodulation, in order to promote improved ability to complete functional activities. HOOKLYING with feet elevated - STM to posterior cervical spine musculature focusing on B suboccipital and UT.  - PROM rotation with overpressure/oscillations as  tolerated each direction focusing on upper cervical spine. 1x10 each side with 10 second hold/oscillations.    Pt required multimodal cuing for proper technique and to facilitate improved neuromuscular control, strength, range of motion, and functional ability resulting in improved performance and form.     HOME EXERCISE PROGRAM: Access Code: [email or web address] URL: [email or web address] Date: 12/26/2021 Prepared by: Rosita Kea   Exercises Seated Assisted Cervical Rotation with Towel - 2 x daily - 2 sets - 10 reps - 5 seconds hold Standing Median Nerve Glide - 2 x daily - 1 sets - 15 reps   PATIENT EDUCATION: Education details: Exercise purpose/form, POC, progress.  Person educated: Patient Education method: Explanation, Demonstration, Tactile cues, and Verbal cues Education comprehension: verbalized understanding, returned demonstration, verbal cues required, tactile cues required, and needs further education    PT Short Term Goals       PT SHORT TERM GOAL #1   Title Be independent with initial home exercise program for self-management of symptoms.    Baseline Initial HEP to be provided visit 2 as appropriate (12/19/2021);    Time 2    Period Weeks    Status Achieved    Target Date 01/02/22  PT Long Term Goals - 12/19/21 1112    TARGET DATE FOR ALL LONG TERM GOALS: 03/13/2022. UPDATED TO TARGET DATE OF 04/24/2022 FOR ALL UNMET GOALS (01/30/2022);    PT LONG TERM GOAL #1   Title Be independent with a long-term home exercise program for self-management of symptoms.    Baseline Initial HEP to be provided visit 2 as appropriate (12/19/2021); continues to participate well (01/30/2022);    Time 12    Period Weeks    Status In-Progress     PT LONG TERM GOAL #2   Title Demonstrate improved FOTO score by 10 units to demonstrate improvement in overall condition and self-reported functional ability.    Baseline to be measured at visit 2 as appropriate, patient needs  assistance (11/21/2021); 38 at visit#2 (12/26/2021); 37 at visit#5 (01/04/2022); 46 at visit#9 (01/30/22);    Time 12    Period Weeks    Status In-Progress     PT LONG TERM GOAL #3   Title Pt will increase UE strength of by at least 1/2 MMT grade in order to demonstrate improvement in strength and function.    Baseline ranges from 3/5 to 4+/5 - see objective exam (12/19/2021); significantly improved strength and firmness against resistance at shoulder and elbow - still some difficulty  holding and weakness in hands and forearm - see objective exam (01/30/22);   Time 12    Period Weeks    Status In-Progress     PT LONG TERM GOAL #4   Title Improve cervical AROM rotation to equal or greater than 45 degrees each direction and cervical extension to 15 degrees to improve ability to check blind spot when driving, view surroundings, and allow patient to complete valued activities with less difficulty.    Baseline Extension: 0 Rotation: R 10,  L 25 (12/19/2021); Extension: 5, Rotation: R 24, L 25 (01/30/2022);   Time 12    Period Weeks    Status In progress     PT LONG TERM GOAL #5   Title Complete community, work and/or recreational activities without limitation due to current condition    Baseline Functional Limitations: difficulty with physical activities, prolonged sitting or standing, using B UE, picking things up, reaching, yardwork, using tractor, driving, riding in vehicle, turning his head, viewing his surroundings, traveling, rolling over at night, sleeping on his side, dressing, ADLs, IADLs (12/19/2021); patient reports improvements in all activities but continues to have difficulties such as difficulty picking up heavy things such as the steel chairs on his front porch, and it is still difficult to sleep. He always gets awoken at 4am. He still has difficulty turning his head and viewing his surroundings and this makes it more difficult to drive. He still drops things. He cannot turn his head enough to  look behind him. He has difficulty sleeping on his side due to numbness in his fingers and pain (01/30/2022);    Time 12    Period Weeks    Status In-Progress             Plan    Clinical Impression Statement Patient arrived with increased headache and tension in his neck after being away from PT since 02/26/2022 while getting his teeth pulled. He continues to report relief from manual therapy and exercise and reported worse quality of life and pain while away from PT. Plan to continue with manual therapy and postural strengthening next session as appropriate. Patient would benefit from continued management of limiting condition by skilled physical  therapist to address remaining impairments and functional limitations to work towards stated goals and return to PLOF or maximal functional independence.     Personal Factors and Comorbidities Comorbidity 3+;Past/Current Experience;Fitness;Time since onset of injury/illness/exacerbation;Education;Social Background    Comorbidities Relevant past medical history and comorbidities include cervical myelopathy leading to C2-6 laminectomy and  C2-T2 fusion performed 02/01/2021, HTN, DMII, hyperlipidemia, lumbar degenerative disc disease, current smoker, obesity, systolic murmur, chronic kidney disease, cervical myelopathy, gout, diverticulitis.    Examination-Activity Limitations Bathing;Hygiene/Grooming;Squat;Lift;Bed Mobility;Bend;Locomotion Level;Stand;Reach Overhead;Carry;Sit;Dressing;Sleep    Examination-Participation Restrictions Community Activity;Yard Work;Driving;Interpersonal Relationship    Stability/Clinical Decision Making Evolving/Moderate complexity    Rehab Potential Good    PT Frequency 2x / week    PT Duration 12 weeks    PT Treatment/Interventions ADLs/Self Care Home Management;Aquatic Therapy;Cryotherapy;Moist Heat;Electrical Stimulation;Neuromuscular re-education;Therapeutic exercise;Therapeutic activities;Balance training;Patient/family  education;Manual techniques;Dry needling;Passive range of motion    PT Next Visit Plan updated HEP as appropriate, postural strengthening, ROM, nerve glides and manual therapy as appropriate    PT Home Exercise Plan Medbridge Access Code: [email or web address]    Consulted and Agree with Plan of Care Patient             Everlean Alstrom. Graylon Good, PT, DPT 03/08/22, 2:28 PM  [facility] [address] [place], [place] [address] P: [phone] I F: [phone]

## 2022-03-08 ENCOUNTER — Ambulatory Visit: Payer: Medicaid Other | Admitting: Physical Therapy

## 2022-03-08 ENCOUNTER — Encounter: Payer: Self-pay | Admitting: Physical Therapy

## 2022-03-08 DIAGNOSIS — M62838 Other muscle spasm: Secondary | ICD-10-CM | POA: Diagnosis not present

## 2022-03-08 DIAGNOSIS — M6281 Muscle weakness (generalized): Secondary | ICD-10-CM

## 2022-03-08 DIAGNOSIS — M5412 Radiculopathy, cervical region: Secondary | ICD-10-CM

## 2022-03-08 DIAGNOSIS — M542 Cervicalgia: Secondary | ICD-10-CM

## 2022-03-08 DIAGNOSIS — R293 Abnormal posture: Secondary | ICD-10-CM

## 2022-03-08 NOTE — Therapy (Incomplete)
OUTPATIENT PHYSICAL THERAPY TREATMENT / PROGRESS NOTE  Dates of reporting from 02/12/2022 to 04/13/2022   Patient Name: Cory Rivas MRN: [id number] DOB:01/04/1966, 56 y.o., male Today's Date: 03/08/2022  PCP: Delsa Grana, PA-C REFERRING PROVIDER: Hazeline Junker, PA              Past Medical History:  Diagnosis Date   Bulging of cervical intervertebral disc    Diabetes mellitus without complication ([place])    Gout    High cholesterol    Hypertension    Stress due to family tension 04/15/2019   Past Surgical History:  Procedure Laterality Date   CHOLECYSTECTOMY     COLONOSCOPY WITH PROPOFOL N/A 03/10/2020   Procedure: COLONOSCOPY WITH PROPOFOL;  Surgeon: Lucilla Lame, MD;  Location: [facility] ENDOSCOPY;  Service: Endoscopy;  Laterality: N/A;   Patient Active Problem List   Diagnosis Date Noted   S/P cervical spinal fusion 08/18/2021   Former heavy tobacco smoker 08/18/2021   Gout 08/18/2021   Other chronic pain 59/56/3875   Systolic murmur 64/33/2951   Tobacco use 10/26/2020   Polyp of ascending colon    Rectal polyp    Class 2 severe obesity with serious comorbidity and body mass index (BMI) of 39.0 to 39.9 in adult [facility]) 04/15/2019   Chronic kidney disease (CKD) stage G2/A3, mildly decreased glomerular filtration rate (GFR) between 60-89 mL/min/1.73 square meter and albuminuria creatinine ratio greater than 300 mg/g 04/15/2019   Proteinuria 04/15/2019   Hyperlipidemia associated with type 2 diabetes mellitus ([place]) 03/18/2019   Diabetes mellitus ([place]) 03/18/2019   Idiopathic chronic gout of multiple sites without tophus 03/18/2019   Essential hypertension 03/18/2019   Dermatitis 03/18/2019   Lumbar degenerative disc disease 11/22/2015   Cervical myelopathy ([place]) 11/22/2015   Osteoarthritis of spine with radiculopathy, lumbar region 11/22/2015    REFERRING DIAG: s/p cervical spine fusion, cervical myelopathy  THERAPY DIAG:  No diagnosis found.  Rationale for  Evaluation and Treatment: Rehabilitation  PERTINENT HISTORY: Patient is a 56 y.o. male who presents to outpatient physical therapy with a referral for medical diagnosis cervical spine fusion, cervical myelopathy. This patient's chief complaints consist of tightness, pain, restricted ROM at the cervical spine and back of head and B UE pain/paresthesia leading to the following functional deficits: difficulty with physical activities, prolonged sitting or standing, using B UE, picking things up, reaching, yardwork, using tractor, driving, riding in vehicle, turning his head, viewing his surroundings, traveling, rolling over at night, sleeping on his side, dressing, ADLs, IADLs. Relevant past medical history and comorbidities include cervical myelopathy leading to C2-6 laminectomy and C2-T2 fusion performed 02/01/2021, HTN, DMII, hyperlipidemia, lumbar degenerative disc disease, current smoker, obesity, systolic murmur, chronic kidney disease, cervical myelopathy, gout, diverticulitis. Patient does not read/write well and needs assistance with completing paperwork.  PRECAUTIONS: don't lift over a certain amount of weight (unsure but thinks maybe 10#?), bending over, picking heavy stuff up (he does not know if/when these precautions expire. He was told not to bend over or pick up anything heavy.   SUBJECTIVE: Patient reports he is feeling tired today. He has had a headache for about 3 days that is behind his eyes and at the base of his head near his posterior neck. He states his teeth were all pulled on Thursday and it went well. He is going back to get the top dentures adjusted because they keep falling out. He reports his neck feels tight and his headache is 3/10. States he had more pain and  stiffness while he was away from PT to get his teeth pulled.   PAIN:  Are you having pain? Yes headache 3/10. Still has numbness in the 2nd and 3rd digits of the right fingers. Neck feels pretty stiff.    OBJECTIVE  SELF-REPORTED FUNCTION FOTO score: ***/100 (neck questionnaire)  SPINE MOTION Cervical Spine AROM (measured prior to manual unless otherwise noted) *Indicates pain Flexion: 23 Extension: 5 Side Flexion:        R 12       L 7 Rotation (prior to manual):  R 24  L 25 Rotation (after manual):  R 30  L 28 Uncomfortable tightness noted while performing all motions.      MUSCLE PERFORMANCE (MMT):  *Indicates pain 12/19/21 01/30/22 Date  Joint/Motion R/L R/L R/L  Shoulder        Flexion 3+/3+ 4+/4+ /  Abduction (C5) 4/3+ 4/44 /  External rotation 3/3 4+/4 /  Internal rotation 4/4 5/5 /  Elbow        Flexion (C6) 4/4 5/4 /  Extension (C7) 3/3 4/4 /  Wrist        Flexion (C7) 4-/4- 4-/4- /  Extension (C6) 3/3 4+/4 /  Radial deviation 4+/4+ 4/3+ /  Ulnar deviation (C8) 4+/4+ 4+/4 /  Hand        Thumb extension (C8) 4/4 4+/4+ /  Finger abduction (T1) unclear WFL /  Grip (C8) Decr. B ~4/~4 /  Comments:  12/19/21: Patient weakness most notable when unable to hold against resistance over several seconds. 01/30/22: firmer quality to all shoulder and elbow MMT. improved grip strength but patient still cannot keep towel from sliding when holding it to perform self stretches (rotational SNAGs) for his neck.      TODAY'S TREATMENT:   Therapeutic exercise: to centralize symptoms and improve ROM, strength, muscular endurance, and activity tolerance required for successful completion of functional activities.  - NuStep level 6 using bilateral upper and lower extremities. Seat/handle setting 7/6. For improved extremity mobility, muscular endurance, and activity tolerance; and to induce the analgesic effect of aerobic exercise, stimulate improved joint nutrition, and prepare body structures and systems for following interventions. x 5 minutes. Average SPM = 96. (Manual therapy - see below) - standing shrugs, 2x10 with 7# DB in each hand.  - seated row at [place] with wide  grip, chest braced against seat, 3x10 with 35#. Pillow folded over at chest.  - seated (pillow folded at chest) lat pull at [facility] machine, 2x10 with 40#.  - seated chest press with plus at [place], 2x10 with 20#. (Mild left elbow discomfort/popping).   Manual therapy: to reduce pain and tissue tension, improve range of motion, neuromodulation, in order to promote improved ability to complete functional activities. HOOKLYING with feet elevated - STM to posterior cervical spine musculature focusing on B suboccipital and UT.  - PROM rotation with overpressure/oscillations as tolerated each direction focusing on upper cervical spine. 1x10 each side with 10 second hold/oscillations.    Pt required multimodal cuing for proper technique and to facilitate improved neuromuscular control, strength, range of motion, and functional ability resulting in improved performance and form.     HOME EXERCISE PROGRAM: Access Code: [email or web address] URL: [email or web address] Date: 12/26/2021 Prepared by: Rosita Kea   Exercises Seated Assisted Cervical Rotation with Towel - 2 x daily - 2 sets - 10 reps - 5 seconds hold Standing Median Nerve Glide - 2 x daily - 1 sets - 15 reps   PATIENT  EDUCATION: Education details: Exercise purpose/form, POC, progress.  Person educated: Patient Education method: Explanation, Demonstration, Tactile cues, and Verbal cues Education comprehension: verbalized understanding, returned demonstration, verbal cues required, tactile cues required, and needs further education    PT Short Term Goals       PT SHORT TERM GOAL #1   Title Be independent with initial home exercise program for self-management of symptoms.    Baseline Initial HEP to be provided visit 2 as appropriate (12/19/2021);    Time 2    Period Weeks    Status Achieved    Target Date 01/02/22              PT Long Term Goals - 12/19/21 1112    TARGET DATE FOR ALL LONG TERM GOALS: 03/13/2022.  UPDATED TO TARGET DATE OF 04/24/2022 FOR ALL UNMET GOALS (01/30/2022);    PT LONG TERM GOAL #1   Title Be independent with a long-term home exercise program for self-management of symptoms.    Baseline Initial HEP to be provided visit 2 as appropriate (12/19/2021); continues to participate well (01/30/2022);    Time 12    Period Weeks    Status In-Progress     PT LONG TERM GOAL #2   Title Demonstrate improved FOTO score by 10 units to demonstrate improvement in overall condition and self-reported functional ability.    Baseline to be measured at visit 2 as appropriate, patient needs assistance (11/21/2021); 38 at visit#2 (12/26/2021); 37 at visit#5 (01/04/2022); 46 at visit#9 (01/30/22);    Time 12    Period Weeks    Status In-Progress     PT LONG TERM GOAL #3   Title Pt will increase UE strength of by at least 1/2 MMT grade in order to demonstrate improvement in strength and function.    Baseline ranges from 3/5 to 4+/5 - see objective exam (12/19/2021); significantly improved strength and firmness against resistance at shoulder and elbow - still some difficulty  holding and weakness in hands and forearm - see objective exam (01/30/22);   Time 12    Period Weeks    Status In-Progress     PT LONG TERM GOAL #4   Title Improve cervical AROM rotation to equal or greater than 45 degrees each direction and cervical extension to 15 degrees to improve ability to check blind spot when driving, view surroundings, and allow patient to complete valued activities with less difficulty.    Baseline Extension: 0 Rotation: R 10,  L 25 (12/19/2021); Extension: 5, Rotation: R 24, L 25 (01/30/2022);   Time 12    Period Weeks    Status In progress     PT LONG TERM GOAL #5   Title Complete community, work and/or recreational activities without limitation due to current condition    Baseline Functional Limitations: difficulty with physical activities, prolonged sitting or standing, using B UE, picking things up, reaching,  yardwork, using tractor, driving, riding in vehicle, turning his head, viewing his surroundings, traveling, rolling over at night, sleeping on his side, dressing, ADLs, IADLs (12/19/2021); patient reports improvements in all activities but continues to have difficulties such as difficulty picking up heavy things such as the steel chairs on his front porch, and it is still difficult to sleep. He always gets awoken at 4am. He still has difficulty turning his head and viewing his surroundings and this makes it more difficult to drive. He still drops things. He cannot turn his head enough to look behind him. He has difficulty  sleeping on his side due to numbness in his fingers and pain (01/30/2022);    Time 12    Period Weeks    Status In-Progress             Plan    Clinical Impression Statement Patient arrived with increased headache and tension in his neck after being away from PT since 02/26/2022 while getting his teeth pulled. He continues to report relief from manual therapy and exercise and reported worse quality of life and pain while away from PT. Plan to continue with manual therapy and postural strengthening next session as appropriate. Patient would benefit from continued management of limiting condition by skilled physical therapist to address remaining impairments and functional limitations to work towards stated goals and return to PLOF or maximal functional independence.     Personal Factors and Comorbidities Comorbidity 3+;Past/Current Experience;Fitness;Time since onset of injury/illness/exacerbation;Education;Social Background    Comorbidities Relevant past medical history and comorbidities include cervical myelopathy leading to C2-6 laminectomy and  C2-T2 fusion performed 02/01/2021, HTN, DMII, hyperlipidemia, lumbar degenerative disc disease, current smoker, obesity, systolic murmur, chronic kidney disease, cervical myelopathy, gout, diverticulitis.    Examination-Activity Limitations  Bathing;Hygiene/Grooming;Squat;Lift;Bed Mobility;Bend;Locomotion Level;Stand;Reach Overhead;Carry;Sit;Dressing;Sleep    Examination-Participation Restrictions Community Activity;Yard Work;Driving;Interpersonal Relationship    Stability/Clinical Decision Making Evolving/Moderate complexity    Rehab Potential Good    PT Frequency 2x / week    PT Duration 12 weeks    PT Treatment/Interventions ADLs/Self Care Home Management;Aquatic Therapy;Cryotherapy;Moist Heat;Electrical Stimulation;Neuromuscular re-education;Therapeutic exercise;Therapeutic activities;Balance training;Patient/family education;Manual techniques;Dry needling;Passive range of motion    PT Next Visit Plan updated HEP as appropriate, postural strengthening, ROM, nerve glides and manual therapy as appropriate    PT Home Exercise Plan Medbridge Access Code: [email or web address]    Consulted and Agree with Plan of Care Patient             Everlean Alstrom. Graylon Good, PT, DPT 03/08/22, 8:01 PM  [facility] [address] [place], [place] [address] P: [phone] I F: [phone]

## 2022-03-09 ENCOUNTER — Telehealth: Payer: Self-pay

## 2022-03-09 NOTE — Telephone Encounter (Signed)
Spoke with patient and relayed lab results per Leisa. Patient stated he ahd been out of his cholesterol medication for a couple weeks prior to having labs drawn but had resumed and would be compliant going forward. He stated he is willing to recheck labs in 3 months as advised to make sure his levels come back down.

## 2022-03-13 ENCOUNTER — Telehealth: Payer: Self-pay | Admitting: Physical Therapy

## 2022-03-13 ENCOUNTER — Ambulatory Visit: Payer: Medicaid Other | Admitting: Physical Therapy

## 2022-03-13 NOTE — Telephone Encounter (Signed)
Called patient when he did not show up for his PT appointment schedule for this morning at 11:15am. He states he thought his appointment was tomorrow. Confirmed his next appointment tomorrow at [place] [facility]. Graylon Good, PT, DPT 03/13/22, 11:45 AM  [facility] [address] [place], [place] [address] P: [phone] I F: [phone]

## 2022-03-13 NOTE — Therapy (Incomplete)
OUTPATIENT PHYSICAL THERAPY TREATMENT / PROGRESS NOTE  Dates of reporting from 02/12/2022 to 04/13/2022   Patient Name: Cory Rivas MRN: [id number] DOB:1966-02-22, 56 y.o., male Today's Date: 03/13/2022  PCP: Delsa Grana, PA-C REFERRING PROVIDER: Hazeline Junker, PA              Past Medical History:  Diagnosis Date   Bulging of cervical intervertebral disc    Diabetes mellitus without complication ([place])    Gout    High cholesterol    Hypertension    Stress due to family tension 04/15/2019   Past Surgical History:  Procedure Laterality Date   CHOLECYSTECTOMY     COLONOSCOPY WITH PROPOFOL N/A 03/10/2020   Procedure: COLONOSCOPY WITH PROPOFOL;  Surgeon: Lucilla Lame, MD;  Location: [facility] ENDOSCOPY;  Service: Endoscopy;  Laterality: N/A;   Patient Active Problem List   Diagnosis Date Noted   S/P cervical spinal fusion 08/18/2021   Former heavy tobacco smoker 08/18/2021   Gout 08/18/2021   Other chronic pain 76/81/1572   Systolic murmur 62/12/5595   Tobacco use 10/26/2020   Polyp of ascending colon    Rectal polyp    Class 2 severe obesity with serious comorbidity and body mass index (BMI) of 39.0 to 39.9 in adult [facility]) 04/15/2019   Chronic kidney disease (CKD) stage G2/A3, mildly decreased glomerular filtration rate (GFR) between 60-89 mL/min/1.73 square meter and albuminuria creatinine ratio greater than 300 mg/g 04/15/2019   Proteinuria 04/15/2019   Hyperlipidemia associated with type 2 diabetes mellitus ([place]) 03/18/2019   Diabetes mellitus ([place]) 03/18/2019   Idiopathic chronic gout of multiple sites without tophus 03/18/2019   Essential hypertension 03/18/2019   Dermatitis 03/18/2019   Lumbar degenerative disc disease 11/22/2015   Cervical myelopathy ([place]) 11/22/2015   Osteoarthritis of spine with radiculopathy, lumbar region 11/22/2015    REFERRING DIAG: s/p cervical spine fusion, cervical myelopathy  THERAPY DIAG:  No diagnosis found.  Rationale for  Evaluation and Treatment: Rehabilitation  PERTINENT HISTORY: Patient is a 56 y.o. male who presents to outpatient physical therapy with a referral for medical diagnosis cervical spine fusion, cervical myelopathy. This patient's chief complaints consist of tightness, pain, restricted ROM at the cervical spine and back of head and B UE pain/paresthesia leading to the following functional deficits: difficulty with physical activities, prolonged sitting or standing, using B UE, picking things up, reaching, yardwork, using tractor, driving, riding in vehicle, turning his head, viewing his surroundings, traveling, rolling over at night, sleeping on his side, dressing, ADLs, IADLs. Relevant past medical history and comorbidities include cervical myelopathy leading to C2-6 laminectomy and C2-T2 fusion performed 02/01/2021, HTN, DMII, hyperlipidemia, lumbar degenerative disc disease, current smoker, obesity, systolic murmur, chronic kidney disease, cervical myelopathy, gout, diverticulitis. Patient does not read/write well and needs assistance with completing paperwork.  PRECAUTIONS: don't lift over a certain amount of weight (unsure but thinks maybe 10#?), bending over, picking heavy stuff up (he does not know if/when these precautions expire. He was told not to bend over or pick up anything heavy.   SUBJECTIVE: Patient reports he is feeling tired today. He has had a headache for about 3 days that is behind his eyes and at the base of his head near his posterior neck. He states his teeth were all pulled on Thursday and it went well. He is going back to get the top dentures adjusted because they keep falling out. He reports his neck feels tight and his headache is 3/10. States he had more pain and  stiffness while he was away from PT to get his teeth pulled.   PAIN:  Are you having pain? Yes headache 3/10. Still has numbness in the 2nd and 3rd digits of the right fingers. Neck feels pretty stiff.    OBJECTIVE  SELF-REPORTED FUNCTION FOTO score: ***/100 (neck questionnaire)  SPINE MOTION Cervical Spine AROM (measured prior to manual unless otherwise noted) *Indicates pain Flexion: 23 Extension: 5 Side Flexion:        R 12       L 7 Rotation (prior to manual):  R 24  L 25 Rotation (after manual):  R 30  L 28 Uncomfortable tightness noted while performing all motions.      MUSCLE PERFORMANCE (MMT):  *Indicates pain 12/19/21 01/30/22 Date  Joint/Motion R/L R/L R/L  Shoulder        Flexion 3+/3+ 4+/4+ /  Abduction (C5) 4/3+ 4/44 /  External rotation 3/3 4+/4 /  Internal rotation 4/4 5/5 /  Elbow        Flexion (C6) 4/4 5/4 /  Extension (C7) 3/3 4/4 /  Wrist        Flexion (C7) 4-/4- 4-/4- /  Extension (C6) 3/3 4+/4 /  Radial deviation 4+/4+ 4/3+ /  Ulnar deviation (C8) 4+/4+ 4+/4 /  Hand        Thumb extension (C8) 4/4 4+/4+ /  Finger abduction (T1) unclear WFL /  Grip (C8) Decr. B ~4/~4 /  Comments:  12/19/21: Patient weakness most notable when unable to hold against resistance over several seconds. 01/30/22: firmer quality to all shoulder and elbow MMT. improved grip strength but patient still cannot keep towel from sliding when holding it to perform self stretches (rotational SNAGs) for his neck.      TODAY'S TREATMENT:   Therapeutic exercise: to centralize symptoms and improve ROM, strength, muscular endurance, and activity tolerance required for successful completion of functional activities.  - NuStep level 6 using bilateral upper and lower extremities. Seat/handle setting 7/6. For improved extremity mobility, muscular endurance, and activity tolerance; and to induce the analgesic effect of aerobic exercise, stimulate improved joint nutrition, and prepare body structures and systems for following interventions. x 5 minutes. Average SPM = 96. (Manual therapy - see below) - standing shrugs, 2x10 with 7# DB in each hand.  - seated row at [place] with wide  grip, chest braced against seat, 3x10 with 35#. Pillow folded over at chest.  - seated (pillow folded at chest) lat pull at [facility] machine, 2x10 with 40#.  - seated chest press with plus at [place], 2x10 with 20#. (Mild left elbow discomfort/popping).   Manual therapy: to reduce pain and tissue tension, improve range of motion, neuromodulation, in order to promote improved ability to complete functional activities. HOOKLYING with feet elevated - STM to posterior cervical spine musculature focusing on B suboccipital and UT.  - PROM rotation with overpressure/oscillations as tolerated each direction focusing on upper cervical spine. 1x10 each side with 10 second hold/oscillations.    Pt required multimodal cuing for proper technique and to facilitate improved neuromuscular control, strength, range of motion, and functional ability resulting in improved performance and form.     HOME EXERCISE PROGRAM: Access Code: [email or web address] URL: [email or web address] Date: 12/26/2021 Prepared by: Rosita Kea   Exercises Seated Assisted Cervical Rotation with Towel - 2 x daily - 2 sets - 10 reps - 5 seconds hold Standing Median Nerve Glide - 2 x daily - 1 sets - 15 reps   PATIENT  EDUCATION: Education details: Exercise purpose/form, POC, progress.  Person educated: Patient Education method: Explanation, Demonstration, Tactile cues, and Verbal cues Education comprehension: verbalized understanding, returned demonstration, verbal cues required, tactile cues required, and needs further education    PT Short Term Goals       PT SHORT TERM GOAL #1   Title Be independent with initial home exercise program for self-management of symptoms.    Baseline Initial HEP to be provided visit 2 as appropriate (12/19/2021);    Time 2    Period Weeks    Status Achieved    Target Date 01/02/22              PT Long Term Goals - 12/19/21 1112    TARGET DATE FOR ALL LONG TERM GOALS: 03/13/2022.  UPDATED TO TARGET DATE OF 04/24/2022 FOR ALL UNMET GOALS (01/30/2022);    PT LONG TERM GOAL #1   Title Be independent with a long-term home exercise program for self-management of symptoms.    Baseline Initial HEP to be provided visit 2 as appropriate (12/19/2021); continues to participate well (01/30/2022);    Time 12    Period Weeks    Status In-Progress     PT LONG TERM GOAL #2   Title Demonstrate improved FOTO score by 10 units to demonstrate improvement in overall condition and self-reported functional ability.    Baseline to be measured at visit 2 as appropriate, patient needs assistance (11/21/2021); 38 at visit#2 (12/26/2021); 37 at visit#5 (01/04/2022); 46 at visit#9 (01/30/22);    Time 12    Period Weeks    Status In-Progress     PT LONG TERM GOAL #3   Title Pt will increase UE strength of by at least 1/2 MMT grade in order to demonstrate improvement in strength and function.    Baseline ranges from 3/5 to 4+/5 - see objective exam (12/19/2021); significantly improved strength and firmness against resistance at shoulder and elbow - still some difficulty  holding and weakness in hands and forearm - see objective exam (01/30/22);   Time 12    Period Weeks    Status In-Progress     PT LONG TERM GOAL #4   Title Improve cervical AROM rotation to equal or greater than 45 degrees each direction and cervical extension to 15 degrees to improve ability to check blind spot when driving, view surroundings, and allow patient to complete valued activities with less difficulty.    Baseline Extension: 0 Rotation: R 10,  L 25 (12/19/2021); Extension: 5, Rotation: R 24, L 25 (01/30/2022);   Time 12    Period Weeks    Status In progress     PT LONG TERM GOAL #5   Title Complete community, work and/or recreational activities without limitation due to current condition    Baseline Functional Limitations: difficulty with physical activities, prolonged sitting or standing, using B UE, picking things up, reaching,  yardwork, using tractor, driving, riding in vehicle, turning his head, viewing his surroundings, traveling, rolling over at night, sleeping on his side, dressing, ADLs, IADLs (12/19/2021); patient reports improvements in all activities but continues to have difficulties such as difficulty picking up heavy things such as the steel chairs on his front porch, and it is still difficult to sleep. He always gets awoken at 4am. He still has difficulty turning his head and viewing his surroundings and this makes it more difficult to drive. He still drops things. He cannot turn his head enough to look behind him. He has difficulty  sleeping on his side due to numbness in his fingers and pain (01/30/2022);    Time 12    Period Weeks    Status In-Progress             Plan    Clinical Impression Statement Patient arrived with increased headache and tension in his neck after being away from PT since 02/26/2022 while getting his teeth pulled. He continues to report relief from manual therapy and exercise and reported worse quality of life and pain while away from PT. Plan to continue with manual therapy and postural strengthening next session as appropriate. Patient would benefit from continued management of limiting condition by skilled physical therapist to address remaining impairments and functional limitations to work towards stated goals and return to PLOF or maximal functional independence.     Personal Factors and Comorbidities Comorbidity 3+;Past/Current Experience;Fitness;Time since onset of injury/illness/exacerbation;Education;Social Background    Comorbidities Relevant past medical history and comorbidities include cervical myelopathy leading to C2-6 laminectomy and  C2-T2 fusion performed 02/01/2021, HTN, DMII, hyperlipidemia, lumbar degenerative disc disease, current smoker, obesity, systolic murmur, chronic kidney disease, cervical myelopathy, gout, diverticulitis.    Examination-Activity Limitations  Bathing;Hygiene/Grooming;Squat;Lift;Bed Mobility;Bend;Locomotion Level;Stand;Reach Overhead;Carry;Sit;Dressing;Sleep    Examination-Participation Restrictions Community Activity;Yard Work;Driving;Interpersonal Relationship    Stability/Clinical Decision Making Evolving/Moderate complexity    Rehab Potential Good    PT Frequency 2x / week    PT Duration 12 weeks    PT Treatment/Interventions ADLs/Self Care Home Management;Aquatic Therapy;Cryotherapy;Moist Heat;Electrical Stimulation;Neuromuscular re-education;Therapeutic exercise;Therapeutic activities;Balance training;Patient/family education;Manual techniques;Dry needling;Passive range of motion    PT Next Visit Plan updated HEP as appropriate, postural strengthening, ROM, nerve glides and manual therapy as appropriate    PT Home Exercise Plan Medbridge Access Code: [email or web address]    Consulted and Agree with Plan of Care Patient             Everlean Alstrom. Graylon Good, PT, DPT 03/13/22, 11:45 AM  [facility] [address] [place],  [address] P: [phone] I F: [phone]

## 2022-03-14 ENCOUNTER — Encounter: Payer: Self-pay | Admitting: Physical Therapy

## 2022-03-14 ENCOUNTER — Ambulatory Visit: Payer: Medicaid Other | Admitting: Physical Therapy

## 2022-03-14 DIAGNOSIS — M62838 Other muscle spasm: Secondary | ICD-10-CM

## 2022-03-14 DIAGNOSIS — M542 Cervicalgia: Secondary | ICD-10-CM | POA: Diagnosis not present

## 2022-03-14 DIAGNOSIS — M6281 Muscle weakness (generalized): Secondary | ICD-10-CM

## 2022-03-14 DIAGNOSIS — R293 Abnormal posture: Secondary | ICD-10-CM | POA: Diagnosis not present

## 2022-03-14 DIAGNOSIS — M5412 Radiculopathy, cervical region: Secondary | ICD-10-CM | POA: Diagnosis not present

## 2022-03-15 ENCOUNTER — Ambulatory Visit: Payer: Medicaid Other | Admitting: Podiatry

## 2022-03-15 ENCOUNTER — Encounter: Payer: Medicaid Other | Admitting: Physical Therapy

## 2022-03-15 NOTE — Therapy (Signed)
OUTPATIENT PHYSICAL THERAPY TREATMENT NOTE     Patient Name: Cory Rivas MRN: [id number] DOB:Oct 07, 1966, 56 y.o., male Today's Date: 03/19/2022  PCP: Delsa Grana, PA-C REFERRING PROVIDER: Hazeline Junker, PA   PT End of Session - 03/19/22 1400     Visit Number 16    Number of Visits 24    Date for PT Re-Evaluation 04/24/22    Authorization Type [employer] MEDICAID HEALTHY BLUE reporting period from 02/12/2022    Authorization Time Period HB NOIB#[id number] 3/14-4/20 12 PT visits; HB Auth# [id number] 4/25-6/2 12 PT visits. [employer] order#[id number] 6/5-9/2 10 PT vistis    Authorization - Visit Number 15    Authorization - Number of Visits 24    Progress Note Due on Visit 20    PT Start Time 8003    PT Stop Time 1427    PT Time Calculation (min) 40 min    Activity Tolerance Patient tolerated treatment well    Behavior During Therapy WFL for tasks assessed/performed                Past Medical History:  Diagnosis Date   Bulging of cervical intervertebral disc    Diabetes mellitus without complication (HCC)    Gout    High cholesterol    Hypertension    Stress due to family tension 04/15/2019   Past Surgical History:  Procedure Laterality Date   CHOLECYSTECTOMY     COLONOSCOPY WITH PROPOFOL N/A 03/10/2020   Procedure: COLONOSCOPY WITH PROPOFOL;  Surgeon: Lucilla Lame, MD;  Location: [facility] ENDOSCOPY;  Service: Endoscopy;  Laterality: N/A;   Patient Active Problem List   Diagnosis Date Noted   S/P cervical spinal fusion 08/18/2021   Former heavy tobacco smoker 08/18/2021   Gout 08/18/2021   Other chronic pain 49/17/9150   Systolic murmur 56/97/9480   Tobacco use 10/26/2020   Polyp of ascending colon    Rectal polyp    Class 2 severe obesity with serious comorbidity and body mass index (BMI) of 39.0 to 39.9 in adult [facility]) 04/15/2019   Chronic kidney disease (CKD) stage G2/A3, mildly decreased glomerular filtration rate (GFR) between 60-89 mL/min/1.73 square meter and  albuminuria creatinine ratio greater than 300 mg/g 04/15/2019   Proteinuria 04/15/2019   Hyperlipidemia associated with type 2 diabetes mellitus ([place]) 03/18/2019   Diabetes mellitus ([place]) 03/18/2019   Idiopathic chronic gout of multiple sites without tophus 03/18/2019   Essential hypertension 03/18/2019   Dermatitis 03/18/2019   Lumbar degenerative disc disease 11/22/2015   Cervical myelopathy ([place]) 11/22/2015   Osteoarthritis of spine with radiculopathy, lumbar region 11/22/2015    REFERRING DIAG: s/p cervical spine fusion, cervical myelopathy  THERAPY DIAG:  Cervicalgia  Radiculopathy, cervical region  Muscle weakness (generalized)  Other muscle spasm  Abnormal posture  Rationale for Evaluation and Treatment: Rehabilitation  PERTINENT HISTORY: Patient is a 56 y.o. male who presents to outpatient physical therapy with a referral for medical diagnosis cervical spine fusion, cervical myelopathy. This patient's chief complaints consist of tightness, pain, restricted ROM at the cervical spine and back of head and B UE pain/paresthesia leading to the following functional deficits: difficulty with physical activities, prolonged sitting or standing, using B UE, picking things up, reaching, yardwork, using tractor, driving, riding in vehicle, turning his head, viewing his surroundings, traveling, rolling over at night, sleeping on his side, dressing, ADLs, IADLs. Relevant past medical history and comorbidities include cervical myelopathy leading to C2-6 laminectomy and C2-T2 fusion performed 02/01/2021, HTN, DMII, hyperlipidemia, lumbar degenerative disc disease,  current smoker, obesity, systolic murmur, chronic kidney disease, cervical myelopathy, gout, diverticulitis. Patient does not read/write well and needs assistance with completing paperwork.  PRECAUTIONS: don't lift over a certain amount of weight (unsure but thinks maybe 10#?), bending over, picking heavy stuff up (he does not know  if/when these precautions expire. He was told not to bend over or pick up anything heavy.   SUBJECTIVE: Patient states he had kind of a "lousy" weekend. He states he had a lot of trouble with allergies and did not sleep well the last couple of nights. He states his neck is pretty stiff this morning. He states he felt better after his last PT session. He did not do much this week. He did a little HEP including SNAGs. He feels a little short winded today.   PAIN:  Are you having pain? Yes  1/10 at base of neck and left upper back. Still has numbness in the 2nd and 3rd digits of the right fingers.   OBJECTIVE    TODAY'S TREATMENT:   Therapeutic exercise: to centralize symptoms and improve ROM, strength, muscular endurance, and activity tolerance required for successful completion of functional activities.  - NuStep level 6 using bilateral upper and lower extremities. Seat/handle setting 7/6. For improved extremity mobility, muscular endurance, and activity tolerance; and to induce the analgesic effect of aerobic exercise, stimulate improved joint nutrition, and prepare body structures and systems for following interventions. x 5 minutes. Average SPM = 84.  - standing shrugs, 3x10 with 10# DB in each hand.  - seated row at [place] with wide grip, chest braced against seat, 3x10 with 35#. Pillow folded over at chest.  - seated (pillow folded at chest) lat pull at [facility] machine, 3x10 with 40#.  - seated chest press with plus at [place], 3x10 with 25#. (Mild left elbow discomfort/popping).   Manual therapy: to reduce pain and tissue tension, improve range of motion, neuromodulation, in order to promote improved ability to complete functional activities. PRONE - CPA grade III-IV to thoracic spine below surgical level to improve mobility.   HOOKLYING with feet elevated - STM to posterior cervical spine musculature focusing on B suboccipital and UT.  - PROM rotation with  overpressure/oscillations as tolerated each direction focusing on upper cervical spine. 1x10 each side with 10 second hold/oscillations.    Pt required multimodal cuing for proper technique and to facilitate improved neuromuscular control, strength, range of motion, and functional ability resulting in improved performance and form.     HOME EXERCISE PROGRAM: Access Code: [email or web address] URL: [email or web address] Date: 12/26/2021 Prepared by: Rosita Kea   Exercises Seated Assisted Cervical Rotation with Towel - 2 x daily - 2 sets - 10 reps - 5 seconds hold Standing Median Nerve Glide - 2 x daily - 1 sets - 15 reps   PATIENT EDUCATION: Education details: Exercise purpose/form, POC, progress.  Person educated: Patient Education method: Explanation, Demonstration, Tactile cues, and Verbal cues Education comprehension: verbalized understanding, returned demonstration, verbal cues required, tactile cues required, and needs further education    PT Short Term Goals       PT SHORT TERM GOAL #1   Title Be independent with initial home exercise program for self-management of symptoms.    Baseline Initial HEP to be provided visit 2 as appropriate (12/19/2021);    Time 2    Period Weeks    Status Achieved    Target Date 01/02/22  PT Long Term Goals - 12/19/21 1112    TARGET DATE FOR ALL LONG TERM GOALS: 03/13/2022. UPDATED TO TARGET DATE OF 04/24/2022 FOR ALL UNMET GOALS (01/30/2022);    PT LONG TERM GOAL #1   Title Be independent with a long-term home exercise program for self-management of symptoms.    Baseline Initial HEP to be provided visit 2 as appropriate (12/19/2021); continues to participate well (01/30/2022); continues to participate well (03/14/2022);    Time 12    Period Weeks    Status In-Progress     PT LONG TERM GOAL #2   Title Demonstrate improved FOTO score by 10 units to demonstrate improvement in overall condition and self-reported functional  ability.    Baseline to be measured at visit 2 as appropriate, patient needs assistance (11/21/2021); 38 at visit#2 (12/26/2021); 37 at visit#5 (01/04/2022); 46 at visit#9 (01/30/22); 47 at visit # 15 (03/14/2022);    Time 12    Period Weeks    Status In-Progress     PT LONG TERM GOAL #3   Title Pt will increase UE strength of by at least 1/2 MMT grade in order to demonstrate improvement in strength and function.    Baseline ranges from 3/5 to 4+/5 - see objective exam (12/19/2021); significantly improved strength and firmness against resistance at shoulder and elbow - still some difficulty  holding and weakness in hands and forearm - see objective exam (01/30/22); all but two motions have improved at least 1/2 MMT grade - see objective (03/14/2022);    Time 12    Period Weeks    Status Nearly met     PT LONG TERM GOAL #4   Title Improve cervical AROM rotation to equal or greater than 45 degrees each direction and cervical extension to 15 degrees to improve ability to check blind spot when driving, view surroundings, and allow patient to complete valued activities with less difficulty.    Baseline Extension: 0 Rotation: R 10,  L 25 (12/19/2021); Extension: 5, Rotation: R 24, L 25 (01/30/2022); Extension: 0, Rotation: R 28, L 32 (03/14/2022);   Time 12    Period Weeks    Status In progress     PT LONG TERM GOAL #5   Title Complete community, work and/or recreational activities without limitation due to current condition    Baseline Functional Limitations: difficulty with physical activities, prolonged sitting or standing, using B UE, picking things up, reaching, yardwork, using tractor, driving, riding in vehicle, turning his head, viewing his surroundings, traveling, rolling over at night, sleeping on his side, dressing, ADLs, IADLs (12/19/2021); patient reports improvements in all activities but continues to have difficulties such as difficulty picking up heavy things such as the steel chairs on his front  porch, and it is still difficult to sleep. He always gets awoken at 4am. He still has difficulty turning his head and viewing his surroundings and this makes it more difficult to drive. He still drops things. He cannot turn his head enough to look behind him. He has difficulty sleeping on his side due to numbness in his fingers and pain (01/30/2022); Moving, driving, lifting has gotten easier for him, he doesn't feel like he has to strain as much, sleeping has been better, can do more before he wears out, he still has difficulty with activities especially lifting, prolonged sitting and standing, viewing surroundings (03/14/2022);    Time 12    Period Weeks    Status In-Progress  Plan    Clinical Impression Statement Patient tolerated treatment well overall with continued feeling of weakness in right hand. He also reported continued feeling of discomfort at left upper thoracic spine with cervical spine extension by end of session. Plan to continue with postural strengthening and manual therapy for improved motion as appropriate next session. Patient would benefit from continued management of limiting condition by skilled physical therapist to address remaining impairments and functional limitations to work towards stated goals and return to PLOF or maximal functional independence.     Personal Factors and Comorbidities Comorbidity 3+;Past/Current Experience;Fitness;Time since onset of injury/illness/exacerbation;Education;Social Background    Comorbidities Relevant past medical history and comorbidities include cervical myelopathy leading to C2-6 laminectomy and  C2-T2 fusion performed 02/01/2021, HTN, DMII, hyperlipidemia, lumbar degenerative disc disease, current smoker, obesity, systolic murmur, chronic kidney disease, cervical myelopathy, gout, diverticulitis.    Examination-Activity Limitations Bathing;Hygiene/Grooming;Squat;Lift;Bed Mobility;Bend;Locomotion Level;Stand;Reach  Overhead;Carry;Sit;Dressing;Sleep    Examination-Participation Restrictions Community Activity;Yard Work;Driving;Interpersonal Relationship    Stability/Clinical Decision Making Evolving/Moderate complexity    Rehab Potential Good    PT Frequency 2x / week    PT Duration 12 weeks    PT Treatment/Interventions ADLs/Self Care Home Management;Aquatic Therapy;Cryotherapy;Moist Heat;Electrical Stimulation;Neuromuscular re-education;Therapeutic exercise;Therapeutic activities;Balance training;Patient/family education;Manual techniques;Dry needling;Passive range of motion    PT Next Visit Plan updated HEP as appropriate, postural strengthening, ROM, nerve glides and manual therapy as appropriate    PT Home Exercise Plan Medbridge Access Code: [email or web address]    Consulted and Agree with Plan of Care Patient             Everlean Alstrom. Graylon Good, PT, DPT 03/19/22, 8:10 PM  [facility] [address] [place], [place] [address] P: [phone] I F: [phone]

## 2022-03-19 ENCOUNTER — Ambulatory Visit: Payer: Medicaid Other | Attending: Physician Assistant | Admitting: Physical Therapy

## 2022-03-19 ENCOUNTER — Encounter: Payer: Self-pay | Admitting: Physical Therapy

## 2022-03-19 DIAGNOSIS — M542 Cervicalgia: Secondary | ICD-10-CM

## 2022-03-19 DIAGNOSIS — M62838 Other muscle spasm: Secondary | ICD-10-CM | POA: Diagnosis present

## 2022-03-19 DIAGNOSIS — M6281 Muscle weakness (generalized): Secondary | ICD-10-CM | POA: Diagnosis present

## 2022-03-19 DIAGNOSIS — M5412 Radiculopathy, cervical region: Secondary | ICD-10-CM | POA: Diagnosis present

## 2022-03-19 DIAGNOSIS — R293 Abnormal posture: Secondary | ICD-10-CM

## 2022-03-26 ENCOUNTER — Encounter: Payer: Self-pay | Admitting: Family Medicine

## 2022-03-26 ENCOUNTER — Ambulatory Visit
Admission: RE | Admit: 2022-03-26 | Discharge: 2022-03-26 | Disposition: A | Discharge status: Home / Self Care | Payer: Medicaid Other | Source: Ambulatory Visit | Attending: Family Medicine | Admitting: Family Medicine

## 2022-03-26 ENCOUNTER — Ambulatory Visit (INDEPENDENT_AMBULATORY_CARE_PROVIDER_SITE_OTHER): Payer: Medicaid Other | Admitting: Family Medicine

## 2022-03-26 ENCOUNTER — Ambulatory Visit
Admission: RE | Admit: 2022-03-26 | Discharge: 2022-03-26 | Disposition: A | Discharge status: Home / Self Care | Payer: Medicaid Other | Attending: Family Medicine | Admitting: Family Medicine

## 2022-03-26 VITALS — BP 164/86 | HR 82 | Temp 97.8°F | Resp 16 | Ht 67.0 in | Wt 242.2 lb

## 2022-03-26 DIAGNOSIS — R051 Acute cough: Secondary | ICD-10-CM

## 2022-03-26 DIAGNOSIS — I10 Essential (primary) hypertension: Secondary | ICD-10-CM

## 2022-03-26 DIAGNOSIS — J069 Acute upper respiratory infection, unspecified: Secondary | ICD-10-CM

## 2022-03-26 DIAGNOSIS — R059 Cough, unspecified: Secondary | ICD-10-CM | POA: Diagnosis not present

## 2022-03-26 MED ORDER — ALBUTEROL SULFATE HFA 108 (90 BASE) MCG/ACT IN AERS: 2.0000 | INHALATION_SPRAY | Freq: Four times a day (QID) | RESPIRATORY_TRACT | 0 refills | Status: DC | PRN

## 2022-03-26 MED ORDER — ALBUTEROL SULFATE (2.5 MG/3ML) 0.083% IN NEBU
2.5000 mg | INHALATION_SOLUTION | Freq: Once | RESPIRATORY_TRACT | Status: AC
  Administered 2022-03-26: 2.5 mg via RESPIRATORY_TRACT

## 2022-03-26 MED ORDER — PREDNISONE 20 MG PO TABS: 40.0000 mg | ORAL_TABLET | Freq: Every day | ORAL | 0 refills | Status: AC

## 2022-03-26 MED ORDER — DOXYCYCLINE HYCLATE 100 MG PO TABS: 100.0000 mg | ORAL_TABLET | Freq: Two times a day (BID) | ORAL | 0 refills | Status: AC

## 2022-03-26 NOTE — Addendum Note (Signed)
Addended by: Delsa Grana on: 03/26/2022 04:35 PM   Modules accepted: Orders

## 2022-03-26 NOTE — Progress Notes (Signed)
Patient here for blood pressure check, since at last visit it was high and he was not taking meds consisently.  Blood pressure today was 164/86 and has been taking meds daily.  Please advise

## 2022-03-26 NOTE — Progress Notes (Addendum)
Patient ID: Cory Rivas, male    DOB: 05/04/1966, 56 y.o.   MRN: [id number]  PCP: Delsa Grana, PA-C  Chief Complaint  Patient presents with   URI   Hypertension    Subjective:   Cory Rivas is a 56 y.o. male, presents to clinic with CC of the following:  HPI  Here for BP recheck, BP is high and he is sick with nasal congestion Has been using nyquil  Feels like he has the flu Joint pain, body aches, fatigue, cough wheeze SOB hot cold chills    Hypertension:  Currently managed on norvasc and lisinopril Pt reports good med compliance and denies any SE.   Blood pressure today is uncontrolled. BP Readings from Last 3 Encounters:  03/26/22 (!) 164/86  03/06/22 (!) 168/112  01/29/22 (!) 162/82   Pt denies CP, SOB, exertional sx, LE edema, palpitation, Ha's, visual disturbances, lightheadedness, hypotension, syncope. Dietary efforts for BP?  None Currently on cold meds - likely elevating BP     Patient Active Problem List   Diagnosis Date Noted   S/P cervical spinal fusion 08/18/2021   Former heavy tobacco smoker 08/18/2021   Gout 08/18/2021   Other chronic pain 46/80/3212   Systolic murmur 24/82/5003   Tobacco use 10/26/2020   Polyp of ascending colon    Rectal polyp    Class 2 severe obesity with serious comorbidity and body mass index (BMI) of 39.0 to 39.9 in adult [facility]) 04/15/2019   Chronic kidney disease (CKD) stage G2/A3, mildly decreased glomerular filtration rate (GFR) between 60-89 mL/min/1.73 square meter and albuminuria creatinine ratio greater than 300 mg/g 04/15/2019   Proteinuria 04/15/2019   Hyperlipidemia associated with type 2 diabetes mellitus ([place]) 03/18/2019   Diabetes mellitus ([place]) 03/18/2019   Idiopathic chronic gout of multiple sites without tophus 03/18/2019   Essential hypertension 03/18/2019   Dermatitis 03/18/2019   Lumbar degenerative disc disease 11/22/2015   Cervical myelopathy ([place]) 11/22/2015   Osteoarthritis of spine with  radiculopathy, lumbar region 11/22/2015      Current Outpatient Medications:    acetaminophen (TYLENOL) 325 MG tablet, SMARTSIG:3 Tablet(s) By Mouth Every 8 Hours PRN, Disp: , Rfl:    allopurinol (ZYLOPRIM) 100 MG tablet, Take 1 tablet (100 mg total) by mouth daily. Take with 300 mg tab (for total of 400 mg once daily), Disp: 90 tablet, Rfl: 3   allopurinol (ZYLOPRIM) 300 MG tablet, Take one 300 mg tab po daily WITH 100 mg tab for total of 400 mg once daily for gout, Disp: 90 tablet, Rfl: 3   amLODipine (NORVASC) 10 MG tablet, Take 1 tablet (10 mg total) by mouth daily., Disp: 90 tablet, Rfl: 3   Baclofen 5 MG TABS, Take 5-10 mg by mouth 3 (three) times daily as needed (msk pain or spasms)., Disp: 90 tablet, Rfl: 3   Continuous Blood Gluc Transmit (DEXCOM G6 TRANSMITTER) MISC, Use 1 each every 3 (three) months, Disp: , Rfl:    diphenhydrAMINE (BENADRYL ALLERGY) 25 MG tablet, Take 1 tablet (25 mg total) by mouth every 6 (six) hours as needed for allergies., Disp: 30 tablet, Rfl: 0   Dulaglutide 1.5 MG/0.5ML SOPN, Inject into the skin., Disp: , Rfl:    DULoxetine (CYMBALTA) 30 MG capsule, Take 1 capsule (30 mg total) by mouth 2 (two) times daily., Disp: 180 capsule, Rfl: 1   ezetimibe (ZETIA) 10 MG tablet, Take 1 tablet (10 mg total) by mouth daily., Disp: 90 tablet, Rfl: 3   ibuprofen (  ADVIL) 800 MG tablet, Take 800 mg by mouth every 6 (six) hours as needed., Disp: , Rfl:    indomethacin (INDOCIN) 25 MG capsule, TAKE 2 TABLETS (50 MG) BY 3 TIMES DAILY FOR 2 DAYS AT ONSET OF GOUT FLARE UP, THEN TAKE 1($RemoveBef'25MG'pWijhkNydN$ )TABLET BY MOUTH FOR 3 DAYS, Disp: 20 capsule, Rfl: 3   insulin glargine (LANTUS SOLOSTAR) 100 UNIT/ML Solostar Pen, Inject into the skin., Disp: , Rfl:    insulin lispro (HUMALOG) 100 UNIT/ML KwikPen, By correction scale based on pre-meal blood sugar. 2 units/ 50 over 150. TDD up to 45 units, Disp: , Rfl:    levocetirizine (XYZAL) 5 MG tablet, Take 1 tablet (5 mg total) by mouth every evening.,  Disp: 90 tablet, Rfl: 1   lisinopril (ZESTRIL) 40 MG tablet, Take 1 tablet (40 mg total) by mouth daily., Disp: 90 tablet, Rfl: 3   metFORMIN (GLUCOPHAGE) 1000 MG tablet, TAKE 1 TABLET BY MOUTH TWICE A DAY WITH A MEAL, Disp: 180 tablet, Rfl: 3   mometasone (NASONEX) 50 MCG/ACT nasal spray, Place 2 sprays into the nose daily., Disp: 1 each, Rfl: 12   Olopatadine HCl 0.2 % SOLN, Apply 1 drop to eye 2 (two) times daily as needed (eye allergies)., Disp: 2.5 mL, Rfl: 3   rosuvastatin (CRESTOR) 40 MG tablet, Take 1 tablet (40 mg total) by mouth daily., Disp: 90 tablet, Rfl: 3   triamcinolone ointment (KENALOG) 0.5 %, Apply 1 application. topically 2 (two) times daily., Disp: 30 g, Rfl: 0   Allergies  Allergen Reactions   Oxycodone Nausea And Vomiting     Social History   Tobacco Use   Smoking status: Every Day    Packs/day: 1.00    Years: 37.00    Total pack years: 37.00    Types: Cigarettes    Start date: 10/19/1989   Smokeless tobacco: Never   Tobacco comments:    30+ years hx smoking 2 ppd, stopped smoking briefly in April 2022 for a surgery and then restarted smoking about 1/2 ppd last 4-5 months   Vaping Use   Vaping Use: Never used  Substance Use Topics   Alcohol use: No   Drug use: No      Chart Review Today: I personally reviewed active problem list, medication list, allergies, family history, social history, health maintenance, notes from last encounter, lab results, imaging with the patient/caregiver today.   Review of Systems  Constitutional: Negative.   HENT: Negative.    Eyes: Negative.   Respiratory: Negative.    Cardiovascular: Negative.   Gastrointestinal: Negative.   Endocrine: Negative.   Genitourinary: Negative.   Musculoskeletal: Negative.   Skin: Negative.   Allergic/Immunologic: Negative.   Neurological: Negative.   Hematological: Negative.   Psychiatric/Behavioral: Negative.    All other systems reviewed and are negative.      Objective:    Vitals:   03/26/22 0844  BP: (!) 164/86  Pulse: 82  Resp: 16  Temp: 97.8 F (36.6 C)  TempSrc: Oral  SpO2: 97%  Weight: 242 lb 3.2 oz (109.9 kg)  Height: $Remove'5\' 7"'bFrmGcv$  (1.702 m)    There is no height or weight on file to calculate BMI.  Physical Exam Vitals and nursing note reviewed.  Constitutional:      General: He is not in acute distress.    Appearance: Normal appearance. He is well-developed. He is ill-appearing. He is not toxic-appearing or diaphoretic.  HENT:     Head: Normocephalic and atraumatic.     Jaw:  No trismus.     Right Ear: External ear normal. There is impacted cerumen.     Left Ear: Tympanic membrane, ear canal and external ear normal. There is no impacted cerumen.     Nose: Mucosal edema, congestion and rhinorrhea present.     Right Sinus: No maxillary sinus tenderness or frontal sinus tenderness.     Left Sinus: No maxillary sinus tenderness or frontal sinus tenderness.     Mouth/Throat:     Mouth: Mucous membranes are moist. Mucous membranes are not pale, not dry and not cyanotic.     Pharynx: Oropharynx is clear. Uvula midline. Posterior oropharyngeal erythema present. No oropharyngeal exudate or uvula swelling.     Tonsils: No tonsillar exudate or tonsillar abscesses.  Eyes:     General: Lids are normal. No scleral icterus.       Right eye: No discharge.        Left eye: No discharge.     Conjunctiva/sclera: Conjunctivae normal.     Pupils: Pupils are equal, round, and reactive to light.  Neck:     Trachea: Trachea and phonation normal. No tracheal deviation.  Cardiovascular:     Rate and Rhythm: Normal rate and regular rhythm.     Pulses:          Radial pulses are 2+ on the right side and 2+ on the left side.     Heart sounds: Normal heart sounds. No murmur heard.    No friction rub. No gallop.  Pulmonary:     Effort: Pulmonary effort is normal. No tachypnea, accessory muscle usage or respiratory distress.     Breath sounds: No stridor. Wheezing  and rhonchi present. No decreased breath sounds or rales.     Comments: Diminished BS b/l at the bases, with poor inspiratory effort, with better effort coughing fits with wheeze and scattered rhonchi Abdominal:     General: Bowel sounds are normal. There is no distension.     Palpations: Abdomen is soft.     Tenderness: There is no abdominal tenderness.  Musculoskeletal:     Cervical back: Rigidity (chronic) present.  Skin:    General: Skin is warm and dry.     Capillary Refill: Capillary refill takes less than 2 seconds.     Coloration: Skin is not pale.     Findings: No rash.     Nails: There is no clubbing.  Neurological:     Mental Status: He is alert. Mental status is at baseline.     Motor: No abnormal muscle tone.     Coordination: Coordination normal.     Gait: Gait normal.  Psychiatric:        Mood and Affect: Mood normal.        Speech: Speech normal.        Behavior: Behavior normal. Behavior is cooperative.      Results for orders placed or performed in visit on 03/06/22  COMPLETE METABOLIC PANEL WITH GFR  Result Value Ref Range   Glucose, Bld 142 (H) 65 - 99 mg/dL   BUN 15 7 - 25 mg/dL   Creat 1.19 0.70 - 1.30 mg/dL   eGFR 72 > OR = 60 mL/min/1.69m2   BUN/Creatinine Ratio NOT APPLICABLE 6 - 22 (calc)   Sodium 136 135 - 146 mmol/L   Potassium 3.8 3.5 - 5.3 mmol/L   Chloride 102 98 - 110 mmol/L   CO2 26 20 - 32 mmol/L   Calcium 9.0 8.6 - 10.3 mg/dL  Total Protein 6.7 6.1 - 8.1 g/dL   Albumin 3.9 3.6 - 5.1 g/dL   Globulin 2.8 1.9 - 3.7 g/dL (calc)   AG Ratio 1.4 1.0 - 2.5 (calc)   Total Bilirubin 0.5 0.2 - 1.2 mg/dL   Alkaline phosphatase (APISO) 70 35 - 144 U/L   AST 15 10 - 35 U/L   ALT 15 9 - 46 U/L  Lipid panel  Result Value Ref Range   Cholesterol 209 (H) <200 mg/dL   HDL 33 (L) > OR = 40 mg/dL   Triglycerides 280 (H) <150 mg/dL   LDL Cholesterol (Calc) 133 (H) mg/dL (calc)   Total CHOL/HDL Ratio 6.3 (H) <5.0 (calc)   Non-HDL Cholesterol (Calc)  176 (H) <130 mg/dL (calc)       Assessment & Plan:     ICD-10-CM   1. Essential hypertension  I10    BP still elevated, but pt ill, will address acute sx, keep bp meds the same and have him recheck again in 2 week, consider adding HCTZ    2. Acute cough  R05.1 DG Chest 2 View    albuterol (PROVENTIL) (2.5 MG/3ML) 0.083% nebulizer solution 2.5 mg    3. Upper respiratory tract infection, unspecified type  J06.9 DG Chest 2 View    albuterol (PROVENTIL) (2.5 MG/3ML) 0.083% nebulizer solution 2.5 mg     Will likely be covering URI sx and cough with abx, sx ongoing for the past 2 months, and he is acutely worse with constitutional sx Will check CXR and likely cover with Abx   He was given a breathing tx in office and this improved his BS, no rales or rhonchi  Suspect acute bronchitis - tx with steroids inhaler mucinex  prednisone, albuterol inhaler and doxy sent in  F/up in 2-3 weeks for BP recheck     Delsa Grana, PA-C 03/26/22 9:05 AM

## 2022-03-26 NOTE — Addendum Note (Signed)
Addended by: Salomon Fick on: 03/26/2022 09:52 AM   Modules accepted: Orders

## 2022-03-26 NOTE — Addendum Note (Signed)
Addended by: Delsa Grana on: 03/26/2022 09:16 AM   Modules accepted: Orders, Level of Service

## 2022-03-27 ENCOUNTER — Encounter: Payer: Self-pay | Admitting: Physical Therapy

## 2022-03-27 ENCOUNTER — Ambulatory Visit: Payer: Medicaid Other

## 2022-03-27 ENCOUNTER — Ambulatory Visit: Payer: Medicaid Other | Admitting: Physical Therapy

## 2022-03-27 DIAGNOSIS — M6281 Muscle weakness (generalized): Secondary | ICD-10-CM

## 2022-03-27 DIAGNOSIS — M542 Cervicalgia: Secondary | ICD-10-CM | POA: Diagnosis not present

## 2022-03-27 DIAGNOSIS — M62838 Other muscle spasm: Secondary | ICD-10-CM

## 2022-03-27 DIAGNOSIS — M5412 Radiculopathy, cervical region: Secondary | ICD-10-CM

## 2022-03-27 DIAGNOSIS — R293 Abnormal posture: Secondary | ICD-10-CM

## 2022-03-27 NOTE — Therapy (Signed)
OUTPATIENT PHYSICAL THERAPY TREATMENT NOTE     Patient Name: Cory Rivas MRN: [id number] DOB:[birth year], 56 y.o., male Today's Date: 03/27/2022  PCP: Delsa Grana, PA-C REFERRING PROVIDER: Hazeline Junker, PA   PT End of Session - 03/27/22 1347     Visit Number 17    Number of Visits 24    Date for PT Re-Evaluation 04/24/22    Authorization Type [place] MEDICA[employer] BLUE reporting period from 02/12/2022    Authorization Time Period HB [id number] 3/14-4/20 12 PT visits; HB Auth# [id number] 4/25-6/2 12 PT visits. carelon order#[id number] 6/5-9/2 10 PT vistis    Authorization - Visit Number 16    Authorization - Number of Visits 24    Progress Note Due on Visit 20    PT Start Time 1346    PT Stop Time 1425    PT Time Calculation (min) 39 min    Activity Tolerance Patient tolerated treatment well    Behavior During Therapy WFL for tasks assessed/performed                 Past Medical History:  Diagnosis Date   Bulging of cervical intervertebral disc    Diabetes mellitus without complication (HCC)    Gout    High cholesterol    Hypertension    Stress due to family tension 04/15/2019   Past Surgical History:  Procedure Laterality Date   CHOLECYSTECTOMY     COLONOSCOPY WITH PROPOFOL N/A 03/10/2020   Procedure: COLONOSCOPY WITH PROPOFOL;  Surgeon: Lucilla Lame, MD;  Location: [facility] ENDOSCOPY;  Service: Endoscopy;  Laterality: N/A;   Patient Active Problem List   Diagnosis Date Noted   S/P cervical spinal fusion 08/18/2021   Former heavy tobacco smoker 08/18/2021   Gout 08/18/2021   Other chronic pain 00/71/2197   Systolic murmur 58/83/2549   Tobacco use 10/26/2020   Polyp of ascending colon    Rectal polyp    Class 2 severe obesity with serious comorbidity and body mass index (BMI) of 39.0 to 39.9 in adult [facility]) 04/15/2019   Chronic kidney disease (CKD) stage G2/A3, mildly decreased glomerular filtration rate (GFR) between 60-89 mL/min/1.73 square meter and  albuminuria creatinine ratio greater than 300 mg/g 04/15/2019   Proteinuria 04/15/2019   Hyperlipidemia associated with type 2 diabetes mellitus ([place]) 03/18/2019   Diabetes mellitus ([place]) 03/18/2019   Idiopathic chronic gout of multiple sites without tophus 03/18/2019   Essential hypertension 03/18/2019   Dermatitis 03/18/2019   Lumbar degenerative disc disease 11/22/2015   Cervical myelopathy ([place]) 11/22/2015   Osteoarthritis of spine with radiculopathy, lumbar region 11/22/2015    REFERRING DIAG: s/p cervical spine fusion, cervical myelopathy  THERAPY DIAG:  Cervicalgia  Radiculopathy, cervical region  Muscle weakness (generalized)  Other muscle spasm  Abnormal posture  Rationale for Evaluation and Treatment: Rehabilitation  PERTINENT HISTORY: Patient is a 56 y.o. male who presents to outpatient physical therapy with a referral for medical diagnosis cervical spine fusion, cervical myelopathy. This patient's chief complaints consist of tightness, pain, restricted ROM at the cervical spine and back of head and B UE pain/paresthesia leading to the following functional deficits: difficulty with physical activities, prolonged sitting or standing, using B UE, picking things up, reaching, yardwork, using tractor, driving, riding in vehicle, turning his head, viewing his surroundings, traveling, rolling over at night, sleeping on his side, dressing, ADLs, IADLs. Relevant past medical history and comorbidities include cervical myelopathy leading to C2-6 laminectomy and C2-T2 fusion performed 02/01/2021, HTN, DMII, hyperlipidemia, lumbar degenerative disc  disease, current smoker, obesity, systolic murmur, chronic kidney disease, cervical myelopathy, gout, diverticulitis. Patient does not read/write well and needs assistance with completing paperwork.  PRECAUTIONS: don't lift over a certain amount of weight (unsure but thinks maybe 10#?), bending over, picking heavy stuff up (he does not know  if/when these precautions expire. He was told not to bend over or pick up anything heavy.   SUBJECTIVE: Patient states he has not been feeling well and went to the doctor yesterday who said he has bronchitis and gave him a breathing treatment. He states he has not been doing anything since last PT session since he has been feeling sick. He has a lot of tightness in the base of his cervical spine but does not provide numeric pain rating for it.   PAIN:  Are you having pain? Yes  "really really tight" at base of neck and left upper back. Still has numbness in the 2nd and 3rd digits of the right fingers.   OBJECTIVE    TODAY'S TREATMENT:   Therapeutic exercise: to centralize symptoms and improve ROM, strength, muscular endurance, and activity tolerance required for successful completion of functional activities.  - NuStep level 6 using bilateral upper and lower extremities. Seat/handle setting 7/6. For improved extremity mobility, muscular endurance, and activity tolerance; and to induce the analgesic effect of aerobic exercise, stimulate improved joint nutrition, and prepare body structures and systems for following interventions. x 5 minutes. Average SPM = 83.  (Manual therapy - see below) - standing shrugs, 3x10 with 7# DB in each hand.  - seated row at [place] with wide grip, chest braced against seat, 2x10 with 25#. Pillow folded over at chest.  - seated (pillow folded at chest) lat pull at [facility] machine, 3x10 with 30#.   Manual therapy: to reduce pain and tissue tension, improve range of motion, neuromodulation, in order to promote improved ability to complete functional activities. SUPINE - STM to posterior cervical spine musculature focusing on B suboccipitals, cervical parapsinals, and UT.  - PROM rotation with overpressure/oscillations as tolerated each direction focusing on upper cervical spine. 1x10 each side with 10 second hold/oscillations. - gentle manual distraction  intermittent    Pt required multimodal cuing for proper technique and to facilitate improved neuromuscular control, strength, range of motion, and functional ability resulting in improved performance and form.     HOME EXERCISE PROGRAM: Access Code: [email or web address] URL: [email or web address] Date: 12/26/2021 Prepared by: Rosita Kea   Exercises Seated Assisted Cervical Rotation with Towel - 2 x daily - 2 sets - 10 reps - 5 seconds hold Standing Median Nerve Glide - 2 x daily - 1 sets - 15 reps   PATIENT EDUCATION: Education details: Exercise purpose/form, POC, progress.  Person educated: Patient Education method: Explanation, Demonstration, Tactile cues, and Verbal cues Education comprehension: verbalized understanding, returned demonstration, verbal cues required, tactile cues required, and needs further education    PT Short Term Goals       PT SHORT TERM GOAL #1   Title Be independent with initial home exercise program for self-management of symptoms.    Baseline Initial HEP to be provided visit 2 as appropriate (12/19/2021);    Time 2    Period Weeks    Status Achieved    Target Date 01/02/22              PT Long Term Goals - 12/19/21 1112    TARGET DATE FOR ALL LONG TERM GOALS: 03/13/2022. UPDATED TO TARGET DATE OF 04/24/2022  FOR ALL UNMET GOALS (01/30/2022);    PT LONG TERM GOAL #1   Title Be independent with a long-term home exercise program for self-management of symptoms.    Baseline Initial HEP to be provided visit 2 as appropriate (12/19/2021); continues to participate well (01/30/2022); continues to participate well (03/14/2022);    Time 12    Period Weeks    Status In-Progress     PT LONG TERM GOAL #2   Title Demonstrate improved FOTO score by 10 units to demonstrate improvement in overall condition and self-reported functional ability.    Baseline to be measured at visit 2 as appropriate, patient needs assistance (11/21/2021); 38 at visit#2 (12/26/2021); 37  at visit#5 (01/04/2022); 46 at visit#9 (01/30/22); 47 at visit # 15 (03/14/2022);    Time 12    Period Weeks    Status In-Progress     PT LONG TERM GOAL #3   Title Pt will increase UE strength of by at least 1/2 MMT grade in order to demonstrate improvement in strength and function.    Baseline ranges from 3/5 to 4+/5 - see objective exam (12/19/2021); significantly improved strength and firmness against resistance at shoulder and elbow - still some difficulty  holding and weakness in hands and forearm - see objective exam (01/30/22); all but two motions have improved at least 1/2 MMT grade - see objective (03/14/2022);    Time 12    Period Weeks    Status Nearly met     PT LONG TERM GOAL #4   Title Improve cervical AROM rotation to equal or greater than 45 degrees each direction and cervical extension to 15 degrees to improve ability to check blind spot when driving, view surroundings, and allow patient to complete valued activities with less difficulty.    Baseline Extension: 0 Rotation: R 10,  L 25 (12/19/2021); Extension: 5, Rotation: R 24, L 25 (01/30/2022); Extension: 0, Rotation: R 28, L 32 (03/14/2022);   Time 12    Period Weeks    Status In progress     PT LONG TERM GOAL #5   Title Complete community, work and/or recreational activities without limitation due to current condition    Baseline Functional Limitations: difficulty with physical activities, prolonged sitting or standing, using B UE, picking things up, reaching, yardwork, using tractor, driving, riding in vehicle, turning his head, viewing his surroundings, traveling, rolling over at night, sleeping on his side, dressing, ADLs, IADLs (12/19/2021); patient reports improvements in all activities but continues to have difficulties such as difficulty picking up heavy things such as the steel chairs on his front porch, and it is still difficult to sleep. He always gets awoken at 4am. He still has difficulty turning his head and viewing his  surroundings and this makes it more difficult to drive. He still drops things. He cannot turn his head enough to look behind him. He has difficulty sleeping on his side due to numbness in his fingers and pain (01/30/2022); Moving, driving, lifting has gotten easier for him, he doesn't feel like he has to strain as much, sleeping has been better, can do more before he wears out, he still has difficulty with activities especially lifting, prolonged sitting and standing, viewing surroundings (03/14/2022);    Time 12    Period Weeks    Status In-Progress             Plan    Clinical Impression Statement Patient arrived with increased discomfort and tightness today and not feeling great after  having bronchitis for the last week. More time was spent on manual therapy to decrease tension and exercise intensity decreased to accommodate decreased activity tolerance due to pain and fatigue. Plan to continue with manual therapy and postural exercises next session.Patient would benefit from continued management of limiting condition by skilled physical therapist to address remaining impairments and functional limitations to work towards stated goals and return to PLOF or maximal functional independence.    Personal Factors and Comorbidities Comorbidity 3+;Past/Current Experience;Fitness;Time since onset of injury/illness/exacerbation;Education;Social Background    Comorbidities Relevant past medical history and comorbidities include cervical myelopathy leading to C2-6 laminectomy and  C2-T2 fusion performed 02/01/2021, HTN, DMII, hyperlipidemia, lumbar degenerative disc disease, current smoker, obesity, systolic murmur, chronic kidney disease, cervical myelopathy, gout, diverticulitis.    Examination-Activity Limitations Bathing;Hygiene/Grooming;Squat;Lift;Bed Mobility;Bend;Locomotion Level;Stand;Reach Overhead;Carry;Sit;Dressing;Sleep    Examination-Participation Restrictions Community Activity;Yard  Work;Driving;Interpersonal Relationship    Stability/Clinical Decision Making Evolving/Moderate complexity    Rehab Potential Good    PT Frequency 2x / week    PT Duration 12 weeks    PT Treatment/Interventions ADLs/Self Care Home Management;Aquatic Therapy;Cryotherapy;Moist Heat;Electrical Stimulation;Neuromuscular re-education;Therapeutic exercise;Therapeutic activities;Balance training;Patient/family education;Manual techniques;Dry needling;Passive range of motion    PT Next Visit Plan updated HEP as appropriate, postural strengthening, ROM, nerve glides and manual therapy as appropriate    PT Home Exercise Plan Medbridge Access Code: [email or web address]    Consulted and Agree with Plan of Care Patient             Everlean Alstrom. Graylon Good, PT, DPT 03/27/22, 2:25 PM  [facility] [address] [place], [place] [address] P: [phone] I F: [phone]

## 2022-03-29 ENCOUNTER — Emergency Department
Admission: EM | Admit: 2022-03-29 | Discharge: 2022-03-29 | Disposition: A | Discharge status: Home / Self Care | Payer: Medicaid Other | Attending: Emergency Medicine | Admitting: Emergency Medicine

## 2022-03-29 ENCOUNTER — Emergency Department: Payer: Medicaid Other

## 2022-03-29 ENCOUNTER — Encounter: Payer: Medicaid Other | Admitting: Physical Therapy

## 2022-03-29 ENCOUNTER — Other Ambulatory Visit: Payer: Self-pay

## 2022-03-29 DIAGNOSIS — R0600 Dyspnea, unspecified: Secondary | ICD-10-CM | POA: Diagnosis not present

## 2022-03-29 DIAGNOSIS — I10 Essential (primary) hypertension: Secondary | ICD-10-CM | POA: Diagnosis not present

## 2022-03-29 DIAGNOSIS — Z794 Long term (current) use of insulin: Secondary | ICD-10-CM | POA: Insufficient documentation

## 2022-03-29 DIAGNOSIS — R0602 Shortness of breath: Secondary | ICD-10-CM | POA: Diagnosis not present

## 2022-03-29 DIAGNOSIS — Z9049 Acquired absence of other specified parts of digestive tract: Secondary | ICD-10-CM | POA: Diagnosis not present

## 2022-03-29 DIAGNOSIS — D72829 Elevated white blood cell count, unspecified: Secondary | ICD-10-CM | POA: Diagnosis not present

## 2022-03-29 DIAGNOSIS — Z20822 Contact with and (suspected) exposure to covid-19: Secondary | ICD-10-CM | POA: Insufficient documentation

## 2022-03-29 DIAGNOSIS — E1165 Type 2 diabetes mellitus with hyperglycemia: Secondary | ICD-10-CM | POA: Diagnosis not present

## 2022-03-29 LAB — BASIC METABOLIC PANEL
Anion gap: 9 (ref 5–15)
BUN: 18 mg/dL (ref 6–20)
CO2: 25 mmol/L (ref 22–32)
Calcium: 8.5 mg/dL — ABNORMAL LOW (ref 8.9–10.3)
Chloride: 103 mmol/L (ref 98–111)
Creatinine, Ser: 1.26 mg/dL — ABNORMAL HIGH (ref 0.61–1.24)
GFR, Estimated: >60 mL/min (ref >60)
Glucose, Bld: 291 mg/dL — ABNORMAL HIGH (ref 70–99)
Potassium: 3.4 mmol/L — ABNORMAL LOW (ref 3.5–5.1)
Sodium: 137 mmol/L (ref 135–145)

## 2022-03-29 LAB — CBC
HCT: 43.1 % (ref 39.0–52.0)
Hemoglobin: 14.3 g/dL (ref 13.0–17.0)
MCH: 27.8 pg (ref 26.0–34.0)
MCHC: 33.2 g/dL (ref 30.0–36.0)
MCV: 83.7 fL (ref 80.0–100.0)
Platelets: 211 10*3/uL (ref 150–400)
RBC: 5.15 MIL/uL (ref 4.22–5.81)
RDW: 14.1 % (ref 11.5–15.5)
WBC: 13.5 10*3/uL — ABNORMAL HIGH (ref 4.0–10.5)
nRBC: 0 % (ref 0.0–0.2)

## 2022-03-29 LAB — RESP PANEL BY RT-PCR (FLU A&B, COVID) ARPGX2
Influenza A by PCR: NEGATIVE
Influenza B by PCR: NEGATIVE
SARS Coronavirus 2 by RT PCR: NEGATIVE

## 2022-03-29 LAB — BRAIN NATRIURETIC PEPTIDE: B Natriuretic Peptide: 114.4 pg/mL — ABNORMAL HIGH (ref 0.0–100.0)

## 2022-03-29 LAB — CBG MONITORING, ED: Glucose-Capillary: 203 mg/dL — ABNORMAL HIGH (ref 70–99)

## 2022-03-29 LAB — TROPONIN I (HIGH SENSITIVITY)
Troponin I (High Sensitivity): 30 ng/L — ABNORMAL HIGH (ref <18)
Troponin I (High Sensitivity): 27 ng/L — ABNORMAL HIGH (ref <18)

## 2022-03-29 MED ORDER — SODIUM CHLORIDE 0.9 % IV BOLUS
1000.0000 mL | Freq: Once | INTRAVENOUS | Status: AC
  Administered 2022-03-29: 1000 mL via INTRAVENOUS

## 2022-03-29 MED ORDER — INSULIN ASPART 100 UNIT/ML IJ SOLN
6.0000 [IU] | Freq: Once | INTRAMUSCULAR | Status: AC
  Administered 2022-03-29: 6 [IU] via INTRAVENOUS
  Filled 2022-03-29: qty 1

## 2022-03-29 MED ORDER — FUROSEMIDE 20 MG PO TABS: 20.0000 mg | ORAL_TABLET | Freq: Every day | ORAL | 0 refills | Status: DC

## 2022-03-29 NOTE — Discharge Instructions (Addendum)
Please take your prescribed Lasix/furosemide (fluid pill) each morning for the next 5 days, starting today.  Please finish your course of steroids and antibiotics as prescribed by your doctor.  Please follow-up with your doctor in the next 2 to 3 days for recheck/reevaluation.  Return to the emergency department for any worsening shortness of breath, any chest pain, or any other symptom personally concerning to yourself.

## 2022-03-29 NOTE — ED Triage Notes (Signed)
Pt here with SOB since last night. Pt states he has been sick for a week with a cough and congestion. Pt states now when he walks he gets SOB easily. Pt states he was dx with bronchitis recently. Pt is a current smoker.

## 2022-03-29 NOTE — ED Notes (Signed)
Pts sister called and updated on discharge instructions. Pt verbalized understanding. Transport car called for pt to be taken over to sisters work at [employer].

## 2022-03-29 NOTE — ED Notes (Signed)
Patient transported to X-ray 

## 2022-03-29 NOTE — ED Provider Notes (Signed)
[facility] Provider Note    Event Date/Time   First MD Initiated Contact with Patient 03/29/22 [phone]     (approximate)  History   Chief Complaint: Shortness of Breath  HPI  Cory Rivas is a 56 y.o. male with a past medical history of hypertension, hyperlipidemia, diabetes, presents to the emergency department with shortness of breath.  According to the patient over the past week or so he has been feeling somewhat short of breath with cough congestion initially with low-grade fever.  Patient was seen by his doctor on Tuesday was prescribed prednisone albuterol and doxycycline which she has been taking.  Patient states he feels like the shortness of breath has not improved although states that the "wheezing" has improved.  Patient states he is also diabetic and recently he has been urinating very frequently.  No recent fever per patient.  No chest pain at any point.  No leg pain or swelling.  Physical Exam   Triage Vital Signs: ED Triage Vitals  Enc Vitals Group     BP 03/29/22 0725 (!) 178/95     Pulse Rate 03/29/22 0725 75     Resp 03/29/22 0725 20     Temp 03/29/22 0725 99.1 F (37.3 C)     Temp Source 03/29/22 0725 Oral     SpO2 03/29/22 0725 96 %     Weight 03/29/22 0724 242 lb 4.6 oz (109.9 kg)     Height 03/29/22 0724 '5\' 7"'$  (1.702 m)     Head Circumference --      Peak Flow --      Pain Score 03/29/22 0724 0     Pain Loc --      Pain Edu? --      Excl. in [place]? --     Most recent vital signs: Vitals:   03/29/22 0725 03/29/22 0740  BP: (!) 178/95 (!) 169/97  Pulse: 75 80  Resp: 20 18  Temp: 99.1 F (37.3 C)   SpO2: 96% 97%    General: Awake, no distress.  CV:  Good peripheral perfusion.  Regular rate and rhythm  Resp:  Normal effort.  Equal breath sounds bilaterally. Abd:  No distention.  Soft, nontender.  No rebound or guarding. Other:  No lower extremity edema or tenderness to palpation   ED Results / Procedures / Treatments    EKG  Reviewed and interpreted by myself shows a normal sinus rhythm at 86 bpm with a narrow QRS, normal axis, normal intervals.  Patient does have inferolateral T wave inversions, largely unchanged from the patient's [place] EKG 10/03/2020  RADIOLOGY  I have reviewed the chest x-ray images no significant abnormality seen on my evaluation.   MEDICATIONS ORDERED IN ED: Medications - No data to display   IMPRESSION / MDM / [place] / ED COURSE  I reviewed the triage vital signs and the nursing notes.  Patient's presentation is most consistent with acute presentation with potential threat to life or bodily function.  Patient presents to the emergency department for shortness of breath.  States for the past week he has been sick with an upper respiratory infection including cough and congestion as well as shortness of breath.  Patient states subjective fever earlier in the week but none recently is currently taking doxycycline and prednisone as well as albuterol as needed.  Overall the patient appears well, clear lung sounds, no respiratory distress no increased respiratory effort.  Patient's basic labs does show mild  leukocytosis likely related to prednisone use, chemistry does show hyperglycemia 290 but reassuringly normal anion gap.  I have added on a troponin and BNP.  We will obtain a chest x-ray as well as a COVID swab.  Patient agreeable to plan of care.  Patient's work-up is overall reassuring.  Troponin unchanged on repeat.  Chest x-ray is negative.  Patient's BNP is elevated a little over 100.  Chemistry shows hyperglycemia treated with 6 units of insulin as well as a liter of fluids.  Patient states he is feeling much better.  Given the patient's BNP being elevated I believe a short course of diuretic may be warranted.  I also discussed with the patient to finish his prednisone and doxycycline.  Patient states he is feeling better and will follow-up with his doctor.  FINAL  CLINICAL IMPRESSION(S) / ED DIAGNOSES   Dyspnea  Note:  This document was prepared using Dragon voice recognition software and may include unintentional dictation errors.   Harvest Dark, MD 03/29/22 1041

## 2022-03-30 ENCOUNTER — Telehealth: Payer: Self-pay

## 2022-03-30 NOTE — Telephone Encounter (Signed)
Transition Care Management Unsuccessful Follow-up Telephone Call  Date of discharge and from where:  03/29/2022-[facility]  Attempts:  1st Attempt  Reason for unsuccessful TCM follow-up call:  Left voice message

## 2022-04-01 ENCOUNTER — Emergency Department: Payer: Medicaid Other

## 2022-04-01 ENCOUNTER — Other Ambulatory Visit: Payer: Self-pay

## 2022-04-01 ENCOUNTER — Emergency Department
Admission: EM | Admit: 2022-04-01 | Discharge: 2022-04-01 | Disposition: A | Discharge status: Home / Self Care | Payer: Medicaid Other | Attending: Emergency Medicine | Admitting: Emergency Medicine

## 2022-04-01 DIAGNOSIS — R0602 Shortness of breath: Secondary | ICD-10-CM | POA: Diagnosis not present

## 2022-04-01 DIAGNOSIS — J9601 Acute respiratory failure with hypoxia: Secondary | ICD-10-CM | POA: Insufficient documentation

## 2022-04-01 DIAGNOSIS — F17200 Nicotine dependence, unspecified, uncomplicated: Secondary | ICD-10-CM | POA: Insufficient documentation

## 2022-04-01 DIAGNOSIS — I10 Essential (primary) hypertension: Secondary | ICD-10-CM | POA: Insufficient documentation

## 2022-04-01 DIAGNOSIS — E119 Type 2 diabetes mellitus without complications: Secondary | ICD-10-CM | POA: Insufficient documentation

## 2022-04-01 DIAGNOSIS — Z20822 Contact with and (suspected) exposure to covid-19: Secondary | ICD-10-CM | POA: Insufficient documentation

## 2022-04-01 DIAGNOSIS — D72829 Elevated white blood cell count, unspecified: Secondary | ICD-10-CM | POA: Insufficient documentation

## 2022-04-01 LAB — CBC WITH DIFFERENTIAL/PLATELET
Abs Immature Granulocytes: 0.06 10*3/uL (ref 0.00–0.07)
Basophils Absolute: 0 10*3/uL (ref 0.0–0.1)
Basophils Relative: 0 %
Eosinophils Absolute: 0.2 10*3/uL (ref 0.0–0.5)
Eosinophils Relative: 2 %
HCT: 47.3 % (ref 39.0–52.0)
Hemoglobin: 15.4 g/dL (ref 13.0–17.0)
Immature Granulocytes: 1 %
Lymphocytes Relative: 20 %
Lymphs Abs: 2.2 10*3/uL (ref 0.7–4.0)
MCH: 27.5 pg (ref 26.0–34.0)
MCHC: 32.6 g/dL (ref 30.0–36.0)
MCV: 84.6 fL (ref 80.0–100.0)
Monocytes Absolute: 0.7 10*3/uL (ref 0.1–1.0)
Monocytes Relative: 6 %
Neutro Abs: 8 10*3/uL — ABNORMAL HIGH (ref 1.7–7.7)
Neutrophils Relative %: 71 %
Platelets: 254 10*3/uL (ref 150–400)
RBC: 5.59 MIL/uL (ref 4.22–5.81)
RDW: 14 % (ref 11.5–15.5)
WBC: 11.2 10*3/uL — ABNORMAL HIGH (ref 4.0–10.5)
nRBC: 0 % (ref 0.0–0.2)

## 2022-04-01 LAB — TROPONIN I (HIGH SENSITIVITY)
Troponin I (High Sensitivity): 16 ng/L (ref <18)
Troponin I (High Sensitivity): 13 ng/L (ref <18)

## 2022-04-01 LAB — RESP PANEL BY RT-PCR (FLU A&B, COVID) ARPGX2
Influenza A by PCR: NEGATIVE
Influenza B by PCR: NEGATIVE
SARS Coronavirus 2 by RT PCR: NEGATIVE

## 2022-04-01 LAB — COMPREHENSIVE METABOLIC PANEL
ALT: 24 U/L (ref 0–44)
AST: 29 U/L (ref 15–41)
Albumin: 3.7 g/dL (ref 3.5–5.0)
Alkaline Phosphatase: 78 U/L (ref 38–126)
Anion gap: 10 (ref 5–15)
BUN: 18 mg/dL (ref 6–20)
CO2: 31 mmol/L (ref 22–32)
Calcium: 9.1 mg/dL (ref 8.9–10.3)
Chloride: 98 mmol/L (ref 98–111)
Creatinine, Ser: 1.46 mg/dL — ABNORMAL HIGH (ref 0.61–1.24)
GFR, Estimated: 56 mL/min — ABNORMAL LOW (ref >60)
Glucose, Bld: 165 mg/dL — ABNORMAL HIGH (ref 70–99)
Potassium: 3.8 mmol/L (ref 3.5–5.1)
Sodium: 139 mmol/L (ref 135–145)
Total Bilirubin: 1.1 mg/dL (ref 0.3–1.2)
Total Protein: 7.8 g/dL (ref 6.5–8.1)

## 2022-04-01 LAB — D-DIMER, QUANTITATIVE: D-Dimer, Quant: 0.44 ug/mL-FEU (ref 0.00–0.50)

## 2022-04-01 LAB — BRAIN NATRIURETIC PEPTIDE: B Natriuretic Peptide: 17.7 pg/mL (ref 0.0–100.0)

## 2022-04-01 LAB — LACTIC ACID, PLASMA
Lactic Acid, Venous: 1.2 mmol/L (ref 0.5–1.9)
Lactic Acid, Venous: 1.2 mmol/L (ref 0.5–1.9)

## 2022-04-01 MED ORDER — PREDNISONE 20 MG PO TABS: 60.0000 mg | ORAL_TABLET | Freq: Every day | ORAL | 0 refills | Status: AC

## 2022-04-01 MED ORDER — IPRATROPIUM-ALBUTEROL 0.5-2.5 (3) MG/3ML IN SOLN
3.0000 mL | Freq: Once | RESPIRATORY_TRACT | Status: AC
  Administered 2022-04-01: 3 mL via RESPIRATORY_TRACT
  Filled 2022-04-01: qty 3

## 2022-04-01 NOTE — ED Triage Notes (Addendum)
Pt states has been sick for over a week with cough, congestion, vomiting. Pt states he is shob with ambulation. Pt states he also has had increased thirst and intermittent foot numbness. Pt placed on oxygen in triage for pox of 88%. Pt with rebound pox after oxygen to 93%. Pt also complains of chest pain. Pt is markedly shob with small exertion in triage, able to speak in full sentences when not exerting. Strong cough noted, congestion noted.

## 2022-04-01 NOTE — ED Provider Notes (Signed)
-----------------------------------------   10:13 AM on 04/01/2022 -----------------------------------------  I took over care of this patient from Dr. Alfred Levins.  The patient's work-up is reassuring including negative troponins x2 and a negative D-dimer.  His respiratory panel is also negative.  Reassessment, I took the patient off of O2 and his oxygen saturation has remained in the mid to high 90s on room air.  He states he is feeling significantly better.  I did consider whether the patient would require admission for acute bronchitis or possible new onset COPD, however the patient states he is feeling well and would like to go home, and given that he is no longer requiring oxygen I feel that this is appropriate.  I will prescribe him an additional 4-day course of prednisone and have instructed him to continue his albuterol.  He will follow-up with his PMD.  Return precautions given, and he expresses understanding.   Arta Silence, MD 04/01/22 1015

## 2022-04-01 NOTE — Discharge Instructions (Addendum)
You likely have bronchitis with no signs of pneumonia at this time.  You may have COPD which is chronic bronchitis related to smoking, although you have not been diagnosed with this yet.  We will give you an additional 4-day course of prednisone to start tomorrow, and you should continue using your albuterol inhaler every 4 hours or so over the next several days.  Follow-up with your regular doctor.  You may need a referral to a pulmonologist if the symptoms continue.  Return to the ER immediately for new, worsening, or persistent severe shortness of breath fever, weakness, vomiting, or any other new or worsening symptoms that concern you.

## 2022-04-01 NOTE — ED Provider Notes (Signed)
[facility] Provider Note    Event Date/Time   First MD Initiated Contact with Patient 04/01/22 0532     (approximate)   History   Shortness of Breath   HPI  Cory Rivas is a 56 y.o. male with a history of hypertension, hyperlipidemia, diabetes, smoking, obesity who presents for evaluation of shortness of breath.  Patient reports 1 week of cough that is getting progressively worse, congestion, progressively worsening shortness of breath.  Has finished a course of prednisone and doxycycline which was given to him by his primary care doctor with no significant relief.  Was seen in the ED 3 days ago and given Lasix for mildly elevated BNP.  He has been taking that with no significant improvement.  He denies any prior history of PE or DVT, recent travel immobilization, leg pain or swelling, hemoptysis or exogenous hormones.  No diagnosis of COPD or CHF     Past Medical History:  Diagnosis Date   Bulging of cervical intervertebral disc    Diabetes mellitus without complication ([place])    Gout    High cholesterol    Hypertension    Stress due to family tension 04/15/2019    Past Surgical History:  Procedure Laterality Date   CHOLECYSTECTOMY     COLONOSCOPY WITH PROPOFOL N/A 03/10/2020   Procedure: COLONOSCOPY WITH PROPOFOL;  Surgeon: Lucilla Lame, MD;  Location: [facility] ENDOSCOPY;  Service: Endoscopy;  Laterality: N/A;     Physical Exam   Triage Vital Signs: ED Triage Vitals  Enc Vitals Group     BP 04/01/22 0516 139/76     Pulse Rate 04/01/22 0516 73     Resp 04/01/22 0515 (!) 30     Temp 04/01/22 0516 98.6 F (37 C)     Temp src --      SpO2 04/01/22 0515 (!) 88 %     Weight 04/01/22 0518 240 lb 4.8 oz (109 kg)     Height 04/01/22 0518 '5\' 7"'$  (1.702 m)     Head Circumference --      Peak Flow --      Pain Score 04/01/22 0517 2     Pain Loc --      Pain Edu? --      Excl. in [place]? --     Most recent vital signs: Vitals:   04/01/22 0525  04/01/22 0706  BP:  135/71  Pulse:  74  Resp: (!) 30 20  Temp:    SpO2:  94%     Constitutional: Alert and oriented.  Mild respiratory distress. HEENT:      Head: Normocephalic and atraumatic.         Eyes: Conjunctivae are normal. Sclera is non-icteric.       Mouth/Throat: Mucous membranes are moist.       Neck: Supple with no signs of meningismus. Cardiovascular: Regular rate and rhythm. No murmurs, gallops, or rubs. 2+ symmetrical distal pulses are present in all extremities.  Respiratory: Mild respiratory distress, tachypneic, hypoxic to 88% on room air now on 2 L, decreased air movement bilaterally with faint wheezes Gastrointestinal: Soft, non tender, and non distended with positive bowel sounds. No rebound or guarding. Genitourinary: No CVA tenderness. Musculoskeletal:  No edema, cyanosis, or erythema of extremities. Neurologic: Normal speech and language. Face is symmetric. Moving all extremities. No gross focal neurologic deficits are appreciated. Skin: Skin is warm, dry and intact. No rash noted. Psychiatric: Mood and affect are normal. Speech and behavior  are normal.  ED Results / Procedures / Treatments   Labs (all labs ordered are listed, but only abnormal results are displayed) Labs Reviewed  COMPREHENSIVE METABOLIC PANEL - Abnormal; Notable for the following components:      Result Value   Glucose, Bld 165 (*)    Creatinine, Ser 1.46 (*)    GFR, Estimated 56 (*)    All other components within normal limits  CBC WITH DIFFERENTIAL/PLATELET - Abnormal; Notable for the following components:   WBC 11.2 (*)    Neutro Abs 8.0 (*)    All other components within normal limits  RESP PANEL BY RT-PCR (FLU A&B, COVID) ARPGX2  LACTIC ACID, PLASMA  BRAIN NATRIURETIC PEPTIDE  LACTIC ACID, PLASMA  D-DIMER, QUANTITATIVE  TROPONIN I (HIGH SENSITIVITY)  TROPONIN I (HIGH SENSITIVITY)     EKG  ED ECG REPORT I, Rudene Re, the attending physician, personally viewed  and interpreted this ECG.  Sinus rhythm with a rate of 84, T wave inversions in inferior lateral leads with no ST elevations or depressions.  Unchanged when compared to prior from 3 days ago  RADIOLOGY I, Rudene Re, attending MD, have personally viewed and interpreted the images obtained during this visit as below:  Chest x-ray is negative   ___________________________________________________ Interpretation by Radiologist:  DG Chest Portable 1 View  Result Date: 04/01/2022 CLINICAL DATA:  56 year old male with history of shortness of breath. EXAM: PORTABLE CHEST 1 VIEW COMPARISON:  Chest x-ray 03/29/2022. FINDINGS: Lung volumes are low. No consolidative airspace disease. No pleural effusions. No pneumothorax. No pulmonary nodule or mass noted. Pulmonary vasculature and the cardiomediastinal silhouette are within normal limits. Orthopedic fixation hardware in the lower cervical spine incidentally noted. IMPRESSION: 1. Low lung volumes without radiographic evidence of acute cardiopulmonary disease. Electronically Signed   By: Vinnie Langton M.D.   On: 04/01/2022 06:41       PROCEDURES:  Critical Care performed: No  Procedures    IMPRESSION / MDM / ASSESSMENT AND PLAN / ED COURSE  I reviewed the triage vital signs and the nursing notes.  56 y.o. male with a history of hypertension, hyperlipidemia, diabetes, smoking, obesity who presents for evaluation of shortness of breath.  Patient arrives in mild respiratory distress, hypoxic and tachypneic which improved on 2 L of oxygen.  Decreased air movement with faint wheezes.  No asymmetric leg swelling.  Ddx: COPD versus bronchitis versus pneumonia versus COVID versus flu versus PE versus CHF versus myocarditis versus pericarditis   Plan: COVID and flu swab, chest x-ray, EKG, troponin x2, BNP, BMP, CBC.  Patient placed on telemetry for close monitoring of cardiorespiratory status.  Patient given 2 DuoNebs   MEDICATIONS GIVEN IN  ED: Medications  ipratropium-albuterol (DUONEB) 0.5-2.5 (3) MG/3ML nebulizer solution 3 mL (3 mLs Nebulization Given 04/01/22 0543)  ipratropium-albuterol (DUONEB) 0.5-2.5 (3) MG/3ML nebulizer solution 3 mL (3 mLs Nebulization Given 04/01/22 0543)     ED COURSE: Chest x-ray with no acute findings.  EKG is unchanged from baseline.  COVID and flu negative.  Mildly elevated white count of 11.2.  Normal BNP, normal troponin.  Creatinine seems to be within patient's range.  No electrolyte derangements.  D-dimer is pending.  Care transferred to Dr. Cherylann Banas   Consults: none   EMR reviewed including records from patient's recent visit with his primary care doctor from 6 days ago for hypertension and cough    FINAL CLINICAL IMPRESSION(S) / ED DIAGNOSES   Final diagnoses:  Acute respiratory failure with hypoxia (  [place])     Rx / DC Orders   ED Discharge Orders     None        Note:  This document was prepared using Dragon voice recognition software and may include unintentional dictation errors.   Please note:  Patient was evaluated in Emergency Department today for the symptoms described in the history of present illness. Patient was evaluated in the context of the global COVID-19 pandemic, which necessitated consideration that the patient might be at risk for infection with the SARS-CoV-2 virus that causes COVID-19. Institutional protocols and algorithms that pertain to the evaluation of patients at risk for COVID-19 are in a state of rapid change based on information released by regulatory bodies including the CDC and federal and state organizations. These policies and algorithms were followed during the patient's care in the ED.  Some ED evaluations and interventions may be delayed as a result of limited staffing during the pandemic.       Alfred Levins, [place], MD 04/01/22 [phone]

## 2022-04-02 ENCOUNTER — Ambulatory Visit: Payer: Medicaid Other | Admitting: Podiatry

## 2022-04-02 ENCOUNTER — Other Ambulatory Visit: Payer: Self-pay | Admitting: Family Medicine

## 2022-04-02 ENCOUNTER — Encounter: Payer: Self-pay | Admitting: Podiatry

## 2022-04-02 DIAGNOSIS — M79674 Pain in right toe(s): Secondary | ICD-10-CM | POA: Diagnosis not present

## 2022-04-02 DIAGNOSIS — M79675 Pain in left toe(s): Secondary | ICD-10-CM | POA: Diagnosis not present

## 2022-04-02 DIAGNOSIS — N182 Chronic kidney disease, stage 2 (mild): Secondary | ICD-10-CM

## 2022-04-02 DIAGNOSIS — B351 Tinea unguium: Secondary | ICD-10-CM | POA: Diagnosis not present

## 2022-04-02 DIAGNOSIS — E0843 Diabetes mellitus due to underlying condition with diabetic autonomic (poly)neuropathy: Secondary | ICD-10-CM | POA: Diagnosis not present

## 2022-04-02 NOTE — Progress Notes (Signed)
This patient returns to my office for at risk foot care.  This patient requires this care by a professional since this patient will be at risk due to having diabetes and CKD.  This patient is unable to cut nails himself since the patient cannot reach his nails.These nails are painful walking and wearing shoes.  This patient presents for at risk foot care today.  General Appearance  Alert, conversant and in no acute stress.  Vascular  Dorsalis pedis and posterior tibial  pulses are palpable  bilaterally.  Capillary return is within normal limits  bilaterally. Temperature is within normal limits  bilaterally.  Neurologic  Senn-Weinstein monofilament wire test within normal limits  bilaterally. Muscle power within normal limits bilaterally.  Nails Thick disfigured discolored nails with subungual debris  from hallux to fifth toes bilaterally. No evidence of bacterial infection or drainage bilaterally.  Orthopedic  No limitations of motion  feet .  No crepitus or effusions noted.  No bony pathology or digital deformities noted.  Skin  normotropic skin with no porokeratosis noted bilaterally.  No signs of infections or ulcers noted.     Onychomycosis  Pain in right toes  Pain in left toes  Consent was obtained for treatment procedures.   Mechanical debridement of nails 1-5  bilaterally performed with a nail nipper.  Filed with dremel without incident.    Return office visit   3 months                   Told patient to return for periodic foot care and evaluation due to potential at risk complications.   Kipp Shank DPM   

## 2022-04-03 ENCOUNTER — Encounter: Payer: Medicaid Other | Admitting: Physical Therapy

## 2022-04-03 NOTE — Telephone Encounter (Signed)
Follow up Visit completed.

## 2022-04-05 ENCOUNTER — Encounter: Payer: Self-pay | Admitting: Physical Therapy

## 2022-04-05 ENCOUNTER — Ambulatory Visit: Payer: Medicaid Other | Admitting: Physical Therapy

## 2022-04-05 ENCOUNTER — Other Ambulatory Visit: Payer: Self-pay | Admitting: Family Medicine

## 2022-04-05 DIAGNOSIS — M542 Cervicalgia: Secondary | ICD-10-CM

## 2022-04-05 DIAGNOSIS — R293 Abnormal posture: Secondary | ICD-10-CM

## 2022-04-05 DIAGNOSIS — M6281 Muscle weakness (generalized): Secondary | ICD-10-CM

## 2022-04-05 DIAGNOSIS — M62838 Other muscle spasm: Secondary | ICD-10-CM

## 2022-04-05 DIAGNOSIS — M5412 Radiculopathy, cervical region: Secondary | ICD-10-CM

## 2022-04-05 NOTE — Therapy (Signed)
OUTPATIENT PHYSICAL THERAPY TREATMENT NOTE     Patient Name: Cory Rivas MRN: [id number] DOB:1966/09/17, 56 y.o., male Today's Date: 04/05/2022  PCP: Delsa Grana, PA-C REFERRING PROVIDER: Hazeline Junker, PA   PT End of Session - 04/05/22 1428     Visit Number 18    Number of Visits 24    Date for PT Re-Evaluation 04/24/22    Authorization Type [place] MEDICAID HE[employer]Y BLUE reporting period from 02/12/2022    Authorization Time Period HB [id number] 3/14-4/20 12 PT visits; HB Auth# [id number] 4/25-6/2 12 PT visits. [employer] order#[id number] 6/5-9/2 10 PT vistis    Authorization - Visit Number 33    Authorization - Number of Visits 24    Progress Note Due on Visit 20    PT Start Time 1350    PT Stop Time 1428    PT Time Calculation (min) 38 min    Activity Tolerance Patient tolerated treatment well    Behavior During Therapy WFL for tasks assessed/performed                  Past Medical History:  Diagnosis Date   Bulging of cervical intervertebral disc    Diabetes mellitus without complication (H[facility])    Gout    High cholesterol    Hypertension    Stress due to family tension 04/15/2019   Past Surgical History:  Procedure Laterality Date   CHOLECYSTECTOMY     COLONOSCOPY WITH PROPOFOL N/A 03/10/2020   Procedure: COLONOSCOPY WITH PROPOFOL;  Surgeon: Lucilla Lame, MD;  Location: [facility] ENDOSCOPY;  Service: Endoscopy;  Laterality: N/A;   Patient Active Problem List   Diagnosis Date Noted   S/P cervical spinal fusion 08/18/2021   Former heavy tobacco smoker 08/18/2021   Gout 08/18/2021   Other chronic pain 78/46/9629   Systolic murmur 52/84/1324   Tobacco use 10/26/2020   Polyp of ascending colon    Rectal polyp    Class 2 severe obesity with serious comorbidity and body mass index (BMI) of 39.0 to 39.9 in adult [facility]) 04/15/2019   Chronic kidney disease (CKD) stage G2/A3, mildly decreased glomerular filtration rate (GFR) between 60-89 mL/min/1.73 square meter and  albuminuria creatinine ratio greater than 300 mg/g 04/15/2019   Proteinuria 04/15/2019   Hyperlipidemia associated with type 2 diabetes mellitus ([place]) 03/18/2019   Diabetes mellitus ([place]) 03/18/2019   Idiopathic chronic gout of multiple sites without tophus 03/18/2019   Essential hypertension 03/18/2019   Dermatitis 03/18/2019   Lumbar degenerative disc disease 11/22/2015   Cervical myelopathy ([place]) 11/22/2015   Osteoarthritis of spine with radiculopathy, lumbar region 11/22/2015    REFERRING DIAG: s/p cervical spine fusion, cervical myelopathy  THERAPY DIAG:  Cervicalgia  Radiculopathy, cervical region  Muscle weakness (generalized)  Other muscle spasm  Abnormal posture  Rationale for Evaluation and Treatment: Rehabilitation  PERTINENT HISTORY: Patient is a 56 y.o. male who presents to outpatient physical therapy with a referral for medical diagnosis cervical spine fusion, cervical myelopathy. This patient's chief complaints consist of tightness, pain, restricted ROM at the cervical spine and back of head and B UE pain/paresthesia leading to the following functional deficits: difficulty with physical activities, prolonged sitting or standing, using B UE, picking things up, reaching, yardwork, using tractor, driving, riding in vehicle, turning his head, viewing his surroundings, traveling, rolling over at night, sleeping on his side, dressing, ADLs, IADLs. Relevant past medical history and comorbidities include cervical myelopathy leading to C2-6 laminectomy and C2-T2 fusion performed 02/01/2021, HTN, DMII, hyperlipidemia, lumbar degenerative  disc disease, current smoker, obesity, systolic murmur, chronic kidney disease, cervical myelopathy, gout, diverticulitis. Patient does not read/write well and needs assistance with completing paperwork.  PRECAUTIONS: don't lift over a certain amount of weight (unsure but thinks maybe 10#?), bending over, picking heavy stuff up (he does not know  if/when these precautions expire. He was told not to bend over or pick up anything heavy.   SUBJECTIVE: Patient has been to the ED twice since the last PT session due to difficulty breathing. He is being treated for bronchitis and needed 2L/min O2 for a while at the last ED visit. He states he is feeling better. He has been using a breathing treatment, prednisone, and an antibiotic and  is feeling better. His neck has been feeling worse especially after his neck popped and his right hand became more numb during an uncontrollable coughing fit at his last ED visit. His fingers have been more numb since. He has been doing some of his stretches but pretty inactive otherwise. He is thinking about going to see his neck doctor due to increased pain in his back and upper back. He rates the pain in his neck as 6/10 and all of his right fingers are numb and were hurting the other night, along with all of his other joints. He has just felt well enough to get up and walk around again without wheezing the last two days.   PAIN:  Are you having pain? Yes 6/10 in neck. Numbness in all of his fingers on the right hand.   OBJECTIVE  Vitals SpO2 97% at start of visit  TODAY'S TREATMENT:   Therapeutic exercise: to centralize symptoms and improve ROM, strength, muscular endurance, and activity tolerance required for successful completion of functional activities.  - NuStep level 4 using bilateral upper and lower extremities. Seat/handle setting 7/6. For improved extremity mobility, muscular endurance, and activity tolerance; and to induce the analgesic effect of aerobic exercise, stimulate improved joint nutrition, and prepare body structures and systems for following interventions. x 5:43 minutes. Average SPM = 73.   (Manual therapy - see below)  - seated shrugs, 3x10 with 7# DB in each hand.   Manual therapy: to reduce pain and tissue tension, improve range of motion, neuromodulation, in order to promote  improved ability to complete functional activities. SUPINE - STM to posterior cervical spine musculature focusing on B suboccipitals, cervical parapsinals, and UT.  - PROM rotation with overpressure/oscillations as tolerated each direction focusing on upper cervical spine. 1x10 each side with 10 second hold/oscillations. - gentle manual distraction intermittent    Pt required multimodal cuing for proper technique and to facilitate improved neuromuscular control, strength, range of motion, and functional ability resulting in improved performance and form.     HOME EXERCISE PROGRAM: Access Code: [email or web address] URL: [email or web address] Date: 12/26/2021 Prepared by: Rosita Kea   Exercises Seated Assisted Cervical Rotation with Towel - 2 x daily - 2 sets - 10 reps - 5 seconds hold Standing Median Nerve Glide - 2 x daily - 1 sets - 15 reps   PATIENT EDUCATION: Education details: Exercise purpose/form, POC, progress.  Person educated: Patient Education method: Explanation, Demonstration, Tactile cues, and Verbal cues Education comprehension: verbalized understanding, returned demonstration, verbal cues required, tactile cues required, and needs further education    PT Short Term Goals       PT SHORT TERM GOAL #1   Title Be independent with initial home exercise program for self-management of symptoms.    Baseline Initial  HEP to be provided visit 2 as appropriate (12/19/2021);    Time 2    Period Weeks    Status Achieved    Target Date 01/02/22              PT Long Term Goals - 12/19/21 1112    TARGET DATE FOR ALL LONG TERM GOALS: 03/13/2022. UPDATED TO TARGET DATE OF 04/24/2022 FOR ALL UNMET GOALS (01/30/2022);    PT LONG TERM GOAL #1   Title Be independent with a long-term home exercise program for self-management of symptoms.    Baseline Initial HEP to be provided visit 2 as appropriate (12/19/2021); continues to participate well (01/30/2022); continues to participate  well (03/14/2022);    Time 12    Period Weeks    Status In-Progress     PT LONG TERM GOAL #2   Title Demonstrate improved FOTO score by 10 units to demonstrate improvement in overall condition and self-reported functional ability.    Baseline to be measured at visit 2 as appropriate, patient needs assistance (11/21/2021); 38 at visit#2 (12/26/2021); 37 at visit#5 (01/04/2022); 46 at visit#9 (01/30/22); 47 at visit # 15 (03/14/2022);    Time 12    Period Weeks    Status In-Progress     PT LONG TERM GOAL #3   Title Pt will increase UE strength of by at least 1/2 MMT grade in order to demonstrate improvement in strength and function.    Baseline ranges from 3/5 to 4+/5 - see objective exam (12/19/2021); significantly improved strength and firmness against resistance at shoulder and elbow - still some difficulty  holding and weakness in hands and forearm - see objective exam (01/30/22); all but two motions have improved at least 1/2 MMT grade - see objective (03/14/2022);    Time 12    Period Weeks    Status Nearly met     PT LONG TERM GOAL #4   Title Improve cervical AROM rotation to equal or greater than 45 degrees each direction and cervical extension to 15 degrees to improve ability to check blind spot when driving, view surroundings, and allow patient to complete valued activities with less difficulty.    Baseline Extension: 0 Rotation: R 10,  L 25 (12/19/2021); Extension: 5, Rotation: R 24, L 25 (01/30/2022); Extension: 0, Rotation: R 28, L 32 (03/14/2022);   Time 12    Period Weeks    Status In progress     PT LONG TERM GOAL #5   Title Complete community, work and/or recreational activities without limitation due to current condition    Baseline Functional Limitations: difficulty with physical activities, prolonged sitting or standing, using B UE, picking things up, reaching, yardwork, using tractor, driving, riding in vehicle, turning his head, viewing his surroundings, traveling, rolling over at  night, sleeping on his side, dressing, ADLs, IADLs (12/19/2021); patient reports improvements in all activities but continues to have difficulties such as difficulty picking up heavy things such as the steel chairs on his front porch, and it is still difficult to sleep. He always gets awoken at 4am. He still has difficulty turning his head and viewing his surroundings and this makes it more difficult to drive. He still drops things. He cannot turn his head enough to look behind him. He has difficulty sleeping on his side due to numbness in his fingers and pain (01/30/2022); Moving, driving, lifting has gotten easier for him, he doesn't feel like he has to strain as much, sleeping has been better, can do  more before he wears out, he still has difficulty with activities especially lifting, prolonged sitting and standing, viewing surroundings (03/14/2022);    Time 12    Period Weeks    Status In-Progress             Plan    Clinical Impression Statement Patient tolerated treatment with some mild difficulty due to difficulty breathing on nustep machine and with prolonged supine lying. He was able to continue with nustep with decreased resistance and complete manual therapy in supine with break to sit and cough. Patient was surprised he did not feel usual restriction at right side of the neck with PROM stretching. He feels a little better by end of session but still a little sore. He noted the left arm felt weak with shrug exercise but can turn his head better. Plan to continue with postural strengthening and ROM interventions next session. Patient would benefit from continued management of limiting condition by skilled physical therapist to address remaining impairments and functional limitations to work towards stated goals and return to PLOF or maximal functional independence.     Personal Factors and Comorbidities Comorbidity 3+;Past/Current Experience;Fitness;Time since onset of  injury/illness/exacerbation;Education;Social Background    Comorbidities Relevant past medical history and comorbidities include cervical myelopathy leading to C2-6 laminectomy and  C2-T2 fusion performed 02/01/2021, HTN, DMII, hyperlipidemia, lumbar degenerative disc disease, current smoker, obesity, systolic murmur, chronic kidney disease, cervical myelopathy, gout, diverticulitis.    Examination-Activity Limitations Bathing;Hygiene/Grooming;Squat;Lift;Bed Mobility;Bend;Locomotion Level;Stand;Reach Overhead;Carry;Sit;Dressing;Sleep    Examination-Participation Restrictions Community Activity;Yard Work;Driving;Interpersonal Relationship    Stability/Clinical Decision Making Evolving/Moderate complexity    Rehab Potential Good    PT Frequency 2x / week    PT Duration 12 weeks    PT Treatment/Interventions ADLs/Self Care Home Management;Aquatic Therapy;Cryotherapy;Moist Heat;Electrical Stimulation;Neuromuscular re-education;Therapeutic exercise;Therapeutic activities;Balance training;Patient/family education;Manual techniques;Dry needling;Passive range of motion    PT Next Visit Plan updated HEP as appropriate, postural strengthening, ROM, nerve glides and manual therapy as appropriate    PT Home Exercise Plan Medbridge Access Code: [email or web address]    Consulted and Agree with Plan of Care Patient             Everlean Alstrom. Graylon Good, PT, DPT 04/05/22, 2:32 PM  [facility] [address] [place], [place] [address] P: [phone] I F: [phone]

## 2022-04-09 ENCOUNTER — Ambulatory Visit: Payer: Medicaid Other

## 2022-04-10 ENCOUNTER — Ambulatory Visit: Payer: Medicaid Other

## 2022-04-10 DIAGNOSIS — M6281 Muscle weakness (generalized): Secondary | ICD-10-CM

## 2022-04-10 DIAGNOSIS — M542 Cervicalgia: Secondary | ICD-10-CM | POA: Diagnosis not present

## 2022-04-10 DIAGNOSIS — M5412 Radiculopathy, cervical region: Secondary | ICD-10-CM

## 2022-04-10 DIAGNOSIS — M62838 Other muscle spasm: Secondary | ICD-10-CM

## 2022-04-10 NOTE — Therapy (Signed)
OUTPATIENT PHYSICAL THERAPY TREATMENT NOTE     Patient Name: Cory Rivas MRN: [id number] DOB:[birth year], 56 y.o., male Today's Date: 04/10/2022  PCP: Danelle Berry, PA-C REFERRING PROVIDER: Donnamarie Poag, PA   PT End of Session - 04/10/22 1356     Visit Number 19    Number of Visits 24    Date for PT Re-Evaluation 04/24/22    Authorization Type [place] [employer]ICA[employer]UE reporting period from 02/12/2022    Authorization Time Period HB [id number]#[id number] 3/14-4/20 12 PT visits; HB Auth# [id number] 4/25-6/2 12 PT visits. [employer] order#[id number] 6/5-9/2 10 PT vistis    Authorization - Visit Number 17    Authorization - Number of Visits 24    Progress Note Due on Visit 20    PT Start Time 1347    PT Stop Time 1430    PT Time Calculation (min) 43 min    Activity Tolerance Patient tolerated treatment well    Behavior During Therapy WFL for tasks assessed/performed                  Past Medical History:  Diagnosis Date   Bulging of cervical intervertebral disc    Diabetes mellitus without complication (HCC)    Gout    High cholesterol    Hypertension    Stress due to family tension 04/15/2019   Past Surgical History:  Procedure Laterality Date   CHOLECYSTECTOMY     COLONOSCOPY WITH PROPOFOL N/A 03/10/2020   Procedure: COLONOSCOPY WITH PROPOFOL;  Surgeon: Midge Minium, MD;  Location: [facility] ENDOSCOPY;  Service: Endoscopy;  Laterality: N/A;   Patient Active Problem List   Diagnosis Date Noted   S/P cervical spinal fusion 08/18/2021   Former heavy tobacco smoker 08/18/2021   Gout 08/18/2021   Other chronic pain 10/26/2020   Systolic murmur 10/26/2020   Tobacco use 10/26/2020   Polyp of ascending colon    Rectal polyp    Class 2 severe obesity with serious comorbidity and body mass index (BMI) of 39.0 to 39.9 in adult [facility]) 04/15/2019   Chronic kidney disease (CKD) stage G2/A3, mildly decreased glomerular filtration rate (GFR) between 60-89 mL/min/1.73 square meter and  albuminuria creatinine ratio greater than 300 mg/g 04/15/2019   Proteinuria 04/15/2019   Hyperlipidemia associated with type 2 diabetes mellitus (HCC) 03/18/2019   Diabetes mellitus (HCC) 03/18/2019   Idiopathic chronic gout of multiple sites without tophus 03/18/2019   Essential hypertension 03/18/2019   Dermatitis 03/18/2019   Lumbar degenerative disc disease 11/22/2015   Cervical myelopathy (HCC) 11/22/2015   Osteoarthritis of spine with radiculopathy, lumbar region 11/22/2015    REFERRING DIAG: s/p cervical spine fusion, cervical myelopathy  THERAPY DIAG:  Cervicalgia  Radiculopathy, cervical region  Muscle weakness (generalized)  Other muscle spasm  Rationale for Evaluation and Treatment: Rehabilitation  PERTINENT HISTORY: Patient is a 56 y.o. male who presents to outpatient physical therapy with a referral for medical diagnosis cervical spine fusion, cervical myelopathy. This patient's chief complaints consist of tightness, pain, restricted ROM at the cervical spine and back of head and B UE pain/paresthesia leading to the following functional deficits: difficulty with physical activities, prolonged sitting or standing, using B UE, picking things up, reaching, yardwork, using tractor, driving, riding in vehicle, turning his head, viewing his surroundings, traveling, rolling over at night, sleeping on his side, dressing, ADLs, IADLs. Relevant past medical history and comorbidities include cervical myelopathy leading to C2-6 laminectomy and C2-T2 fusion performed 02/01/2021, HTN, DMII, hyperlipidemia, lumbar degenerative disc disease, current  smoker, obesity, systolic murmur, chronic kidney disease, cervical myelopathy, gout, diverticulitis. Patient does not read/write well and needs assistance with completing paperwork.  PRECAUTIONS: don't lift over a certain amount of weight (unsure but thinks maybe 10#?), bending over, picking heavy stuff up (he does not know if/when these  precautions expire. He was told not to bend over or pick up anything heavy.   SUBJECTIVE: Pt reports soreness in neck. No pain. Has had some muscle tightness primarily from being sedentary due to rainy weather.   PAIN:  Are you having pain? No pain currently. N/T along ulnar nerve distribution of R hand.  OBJECTIVE  TODAY'S TREATMENT:  04/10/22:  There.ex:   Supine exercises:    Cervical extension x20    R cervical rotation: x20    Cervical retractions: x20     L lateral flexion: 3x1 minute, with gentle PT overpressure.   Seated thoracic extensions over 1/2 bolster: x20 with UE's across chest.    Standing shrugs, 3x10 with 8# DB in each hand. VC's for elbow extension and scap retraction.    Seated row at [facility] machine with wide grip, chest braced against seat, 2x10 with 25#. Pillow folded over at chest.   Education on sleeping and nerve tension techniques with resting UE overhead to decrease tension from brachial plexus.  Pt required multimodal cuing for proper technique and to facilitate improved neuromuscular control, strength, range of motion, and functional ability resulting in improved performance and form.     HOME EXERCISE PROGRAM: Access Code: [email or web address] URL: [email or web address] Date: 12/26/2021 Prepared by: Norton Blizzard   Exercises Seated Assisted Cervical Rotation with Towel - 2 x daily - 2 sets - 10 reps - 5 seconds hold Standing Median Nerve Glide - 2 x daily - 1 sets - 15 reps   PATIENT EDUCATION: Education details: form/technique with exercise. Person educated: Patient Education method: Explanation, Demonstration, Tactile cues, and Verbal cues Education comprehension: verbalized understanding, returned demonstration, verbal cues required, tactile cues required, and needs further education    PT Short Term Goals       PT SHORT TERM GOAL #1   Title Be independent with initial home exercise program for self-management of symptoms.    Baseline  Initial HEP to be provided visit 2 as appropriate (12/19/2021);    Time 2    Period Weeks    Status Achieved    Target Date 01/02/22              PT Long Term Goals - 12/19/21 1112    TARGET DATE FOR ALL LONG TERM GOALS: 03/13/2022. UPDATED TO TARGET DATE OF 04/24/2022 FOR ALL UNMET GOALS (01/30/2022);    PT LONG TERM GOAL #1   Title Be independent with a long-term home exercise program for self-management of symptoms.    Baseline Initial HEP to be provided visit 2 as appropriate (12/19/2021); continues to participate well (01/30/2022); continues to participate well (03/14/2022);    Time 12    Period Weeks    Status In-Progress     PT LONG TERM GOAL #2   Title Demonstrate improved FOTO score by 10 units to demonstrate improvement in overall condition and self-reported functional ability.    Baseline to be measured at visit 2 as appropriate, patient needs assistance (11/21/2021); 38 at visit#2 (12/26/2021); 37 at visit#5 (01/04/2022); 46 at visit#9 (01/30/22); 47 at visit # 15 (03/14/2022);    Time 12    Period Weeks    Status In-Progress     PT LONG  TERM GOAL #3   Title Pt will increase UE strength of by at least 1/2 MMT grade in order to demonstrate improvement in strength and function.    Baseline ranges from 3/5 to 4+/5 - see objective exam (12/19/2021); significantly improved strength and firmness against resistance at shoulder and elbow - still some difficulty  holding and weakness in hands and forearm - see objective exam (01/30/22); all but two motions have improved at least 1/2 MMT grade - see objective (03/14/2022);    Time 12    Period Weeks    Status Nearly met     PT LONG TERM GOAL #4   Title Improve cervical AROM rotation to equal or greater than 45 degrees each direction and cervical extension to 15 degrees to improve ability to check blind spot when driving, view surroundings, and allow patient to complete valued activities with less difficulty.    Baseline Extension: 0 Rotation: R  10,  L 25 (12/19/2021); Extension: 5, Rotation: R 24, L 25 (01/30/2022); Extension: 0, Rotation: R 28, L 32 (03/14/2022);   Time 12    Period Weeks    Status In progress     PT LONG TERM GOAL #5   Title Complete community, work and/or recreational activities without limitation due to current condition    Baseline Functional Limitations: difficulty with physical activities, prolonged sitting or standing, using B UE, picking things up, reaching, yardwork, using tractor, driving, riding in vehicle, turning his head, viewing his surroundings, traveling, rolling over at night, sleeping on his side, dressing, ADLs, IADLs (12/19/2021); patient reports improvements in all activities but continues to have difficulties such as difficulty picking up heavy things such as the steel chairs on his front porch, and it is still difficult to sleep. He always gets awoken at 4am. He still has difficulty turning his head and viewing his surroundings and this makes it more difficult to drive. He still drops things. He cannot turn his head enough to look behind him. He has difficulty sleeping on his side due to numbness in his fingers and pain (01/30/2022); Moving, driving, lifting has gotten easier for him, he doesn't feel like he has to strain as much, sleeping has been better, can do more before he wears out, he still has difficulty with activities especially lifting, prolonged sitting and standing, viewing surroundings (03/14/2022);    Time 12    Period Weeks    Status In-Progress             Plan    Clinical Impression Statement Continuing PT POC with focus on cervical and thoracic mobility to improve cervical AROM. No obvious signs of SOB limiting session today with pt reporting improvements in SOB as time goes on. Pt remains with deficits primarily in R rotation and extension. Pt does report relief in stiffness in lower cervical spine with thoracic mobility and resistance exercises. Pt will continue to benefit from  skilled PT services to progress cervical pain and mobility for ADL completion.    Personal Factors and Comorbidities Comorbidity 3+;Past/Current Experience;Fitness;Time since onset of injury/illness/exacerbation;Education;Social Background    Comorbidities Relevant past medical history and comorbidities include cervical myelopathy leading to C2-6 laminectomy and  C2-T2 fusion performed 02/01/2021, HTN, DMII, hyperlipidemia, lumbar degenerative disc disease, current smoker, obesity, systolic murmur, chronic kidney disease, cervical myelopathy, gout, diverticulitis.    Examination-Activity Limitations Bathing;Hygiene/Grooming;Squat;Lift;Bed Mobility;Bend;Locomotion Level;Stand;Reach Overhead;Carry;Sit;Dressing;Sleep    Examination-Participation Restrictions Community Activity;Yard Work;Driving;Interpersonal Relationship    Stability/Clinical Decision Making Evolving/Moderate complexity    Rehab Potential  Good    PT Frequency 2x / week    PT Duration 12 weeks    PT Treatment/Interventions ADLs/Self Care Home Management;Aquatic Therapy;Cryotherapy;Moist Heat;Electrical Stimulation;Neuromuscular re-education;Therapeutic exercise;Therapeutic activities;Balance training;Patient/family education;Manual techniques;Dry needling;Passive range of motion    PT Next Visit Plan updated HEP as appropriate, postural strengthening, ROM, nerve glides and manual therapy as appropriate    PT Home Exercise Plan Medbridge Access Code: [email or web address]    Consulted and Agree with Plan of Care Patient             Delphia Grates. Fairly IV, PT, DPT Physical Therapist- [facility]  04/10/22, 3:02 PM  [facility] [address] [place], [place] [address] P: [phone] I F: [phone]

## 2022-04-18 ENCOUNTER — Ambulatory Visit: Payer: Medicaid Other | Attending: Physician Assistant | Admitting: Physical Therapy

## 2022-04-18 ENCOUNTER — Encounter: Payer: Self-pay | Admitting: Physical Therapy

## 2022-04-18 DIAGNOSIS — M542 Cervicalgia: Secondary | ICD-10-CM | POA: Insufficient documentation

## 2022-04-18 DIAGNOSIS — R293 Abnormal posture: Secondary | ICD-10-CM | POA: Insufficient documentation

## 2022-04-18 DIAGNOSIS — M5412 Radiculopathy, cervical region: Secondary | ICD-10-CM | POA: Diagnosis not present

## 2022-04-18 DIAGNOSIS — M6281 Muscle weakness (generalized): Secondary | ICD-10-CM | POA: Diagnosis not present

## 2022-04-18 DIAGNOSIS — M62838 Other muscle spasm: Secondary | ICD-10-CM | POA: Diagnosis not present

## 2022-04-18 NOTE — Therapy (Addendum)
OUTPATIENT PHYSICAL THERAPY TREATMENT / PROGRESS NOTE / Re-Certification Dates of reporting: 02/12/2022 to 7/5/20023    Patient Name: Cory Rivas MRN: [id number] DOB:04-28-1966, 56 y.o., male Today's Date: 04/18/2022  PCP: Delsa Grana, PA-C REFERRING PROVIDER: Hazeline Junker, PA   PT End of Session - 04/18/22 1009     Visit Number 20    Number of Visits 42    Date for PT Re-Evaluation 06/20/22    Authorization Type [place] MEDICAID HEALTHY BLUE reporting period from 02/12/2022    Authorization Time Period HB [id number] 3/14-4/20 12 PT visits; HB Auth# [id number] 4/25-6/2 12 PT visits. [employer] order#[id number] 6/5-9/2 10 PT vistis    Authorization - Visit Number 35    Authorization - Number of Visits 24    Progress Note Due on Visit 20    PT Start Time 0950    PT Stop Time 1033    PT Time Calculation (min) 43 min    Activity Tolerance Patient tolerated treatment well    Behavior During Therapy WFL for tasks assessed/performed                   Past Medical History:  Diagnosis Date   Bulging of cervical intervertebral disc    Diabetes mellitus without complication (HCC)    Gout    High cholesterol    Hypertension    Stress due to family tension 04/15/2019   Past Surgical History:  Procedure Laterality Date   CHOLECYSTECTOMY     COLONOSCOPY WITH PROPOFOL N/A 03/10/2020   Procedure: COLONOSCOPY WITH PROPOFOL;  Surgeon: Lucilla Lame, MD;  Location: [facility] ENDOSCOPY;  Service: Endoscopy;  Laterality: N/A;   Patient Active Problem List   Diagnosis Date Noted   S/P cervical spinal fusion 08/18/2021   Former heavy tobacco smoker 08/18/2021   Gout 08/18/2021   Other chronic pain 24/26/8341   Systolic murmur 96/22/2979   Tobacco use 10/26/2020   Polyp of ascending colon    Rectal polyp    Class 2 severe obesity with serious comorbidity and body mass index (BMI) of 39.0 to 39.9 in adult [facility]) 04/15/2019   Chronic kidney disease (CKD) stage G2/A3, mildly decreased  glomerular filtration rate (GFR) between 60-89 mL/min/1.73 square meter and albuminuria creatinine ratio greater than 300 mg/g 04/15/2019   Proteinuria 04/15/2019   Hyperlipidemia associated with type 2 diabetes mellitus ([place]) 03/18/2019   Diabetes mellitus ([place]) 03/18/2019   Idiopathic chronic gout of multiple sites without tophus 03/18/2019   Essential hypertension 03/18/2019   Dermatitis 03/18/2019   Lumbar degenerative disc disease 11/22/2015   Cervical myelopathy ([place]) 11/22/2015   Osteoarthritis of spine with radiculopathy, lumbar region 11/22/2015    REFERRING DIAG: s/p cervical spine fusion, cervical myelopathy  THERAPY DIAG:  Cervicalgia - Plan: PT plan of care cert/re-cert  Radiculopathy, cervical region - Plan: PT plan of care cert/re-cert  Muscle weakness (generalized) - Plan: PT plan of care cert/re-cert  Other muscle spasm - Plan: PT plan of care cert/re-cert  Abnormal posture - Plan: PT plan of care cert/re-cert  Rationale for Evaluation and Treatment: Rehabilitation  PERTINENT HISTORY: Patient is a 56 y.o. male who presents to outpatient physical therapy with a referral for medical diagnosis cervical spine fusion, cervical myelopathy. This patient's chief complaints consist of tightness, pain, restricted ROM at the cervical spine and back of head and B UE pain/paresthesia leading to the following functional deficits: difficulty with physical activities, prolonged sitting or standing, using B UE, picking things up, reaching, yardwork, using tractor,  driving, riding in vehicle, turning his head, viewing his surroundings, traveling, rolling over at night, sleeping on his side, dressing, ADLs, IADLs. Relevant past medical history and comorbidities include cervical myelopathy leading to C2-6 laminectomy and C2-T2 fusion performed 02/01/2021, HTN, DMII, hyperlipidemia, lumbar degenerative disc disease, current smoker, obesity, systolic murmur, chronic kidney disease, cervical  myelopathy, gout, diverticulitis. Patient does not read/write well and needs assistance with completing paperwork.  PRECAUTIONS: don't lift over a certain amount of weight (unsure but thinks maybe 10#?), bending over, picking heavy stuff up (he does not know if/when these precautions expire. He was told not to bend over or pick up anything heavy.   SUBJECTIVE: Pt reports soreness at base of neck on the left. He states it has hurting "real bad" at the left base of neck since he caughed really hard and sneezed. He was going to call his doctor but he did not. He put ice on it and it helped it out.   PAIN:  Are you having pain? No pain currently. Neck feels stiff/sore, especially at left side of base of neck.  N/T along ulnar nerve distribution of R hand as well as radial two fingers.   OBJECTIVE    SPINE MOTION Cervical Spine AROM (measured after manual) *Indicates pain Flexion: 25 Extension: 2 Side Flexion:        R 10       L11 Rotation   R 30 L 32 Uncomfortable tightness noted while performing all motions.      MUSCLE PERFORMANCE (MMT):  *Indicates pain 12/19/21 01/30/22 04/18/22  Joint/Motion R/L R/L R/L  Shoulder        Flexion 3+/3+ 4+/4+ 5/4+  Abduction (C5) 4/3+ 4/4 5/4+  External rotation 3/3 4+/4 5/4  Internal rotation 4/4 5/5 5/5  Elbow        Flexion (C6) 4/4 5/4 5/4+  Extension (C7) 3/3 4/4 4/4+  Wrist        Flexion (C7) 4-/4- 4-/4- 5/4+  Extension (C6) 3/3 4+/4 4+/4  Radial deviation 4+/4+ 4/3+ 4/4+  Ulnar deviation (C8) 4+/4+ 4+/4 4+/4+  Hand        Thumb extension (C8) 4/4 4+/4+ 5/4+  Finger abduction (T1) unclear WFL WNL  Grip (C8) Decr. B ~4/~4 ~4+/~4+  Comments:  12/19/21: Patient weakness most notable when unable to hold against resistance over several seconds. 01/30/22: firmer quality to all shoulder and elbow MMT. improved grip strength but patient still cannot keep towel from sliding when holding it to perform self stretches (rotational SNAGs) for his  neck.  04/18/2022: rarely drops towel while stretching demonstrating improved hand strength.     TODAY'S TREATMENT:  Therapeutic exercise: to centralize symptoms and improve ROM, strength, muscular endurance, and activity tolerance required for successful completion of functional activities.  - NuStep level 4 using bilateral upper and lower extremities. Seat/handle setting 7/6. For improved extremity mobility, muscular endurance, and activity tolerance; and to induce the analgesic effect of aerobic exercise, stimulate improved joint nutrition, and prepare body structures and systems for following interventions. x 5 minutes. Average SPM = 90.  - Seated thoracic extensions over 1/2 bolster: x20 with UE's across chest.   (Manual therapy - see below)   - measurements to assess progress - see above  Manual therapy: to reduce pain and tissue tension, improve range of motion, neuromodulation, in order to promote improved ability to complete functional activities. SUPINE - STM to posterior cervical spine musculature focusing on B suboccipitals, cervical parapsinals, and L UT/LS (very  tight and tender at [facility])..  - PROM rotation with overpressure/oscillations as tolerated each direction focusing on upper cervical spine. 1x10 each side with 10 second hold/oscillations. - PROM cervical spine extension/flexion 2x10  Pt required multimodal cuing for proper technique and to facilitate improved neuromuscular control, strength, range of motion, and functional ability resulting in improved performance and form.     HOME EXERCISE PROGRAM: Access Code: [email or web address] URL: [email or web address] Date: 12/26/2021 Prepared by: Rosita Kea   Exercises Seated Assisted Cervical Rotation with Towel - 2 x daily - 2 sets - 10 reps - 5 seconds hold Standing Median Nerve Glide - 2 x daily - 1 sets - 15 reps   PATIENT EDUCATION: Education details: form/technique with exercise. Person educated:  Patient Education method: Explanation, Demonstration, Tactile cues, and Verbal cues Education comprehension: verbalized understanding, returned demonstration, verbal cues required, tactile cues required, and needs further education    PT Short Term Goals       PT SHORT TERM GOAL #1   Title Be independent with initial home exercise program for self-management of symptoms.    Baseline Initial HEP to be provided visit 2 as appropriate (12/19/2021);    Time 2    Period Weeks    Status Achieved    Target Date 01/02/22              PT Long Term Goals - 12/19/21 1112   TARGET DATE FOR ALL LONG TERM GOALS: 03/13/2022. UPDATED TO TARGET DATE OF 04/24/2022 FOR ALL UNMET GOALS (01/30/2022); UPDATED TO TARGET DATE OF 06/20/2022 FOR ALL UNMET GOALS (04/18/2022);    PT LONG TERM GOAL #1   Title Be independent with a long-term home exercise program for self-management of symptoms.    Baseline Initial HEP to be provided visit 2 as appropriate (12/19/2021); continues to participate well (01/30/2022); continues to participate well (03/14/2022); continues to participate well (04/18/2022);   Time 12    Period Weeks    Status In-Progress     PT LONG TERM GOAL #2   Title Demonstrate improved FOTO score by 10 units to demonstrate improvement in overall condition and self-reported functional ability.    Baseline to be measured at visit 2 as appropriate, patient needs assistance (11/21/2021); 38 at visit#2 (12/26/2021); 37 at visit#5 (01/04/2022); 46 at visit#9 (01/30/22); 47 at visit # 15 (03/14/2022);    Time 12    Period Weeks    Status In-Progress     PT LONG TERM GOAL #3   Title Pt will increase UE strength of by at least 1/2 MMT grade in order to demonstrate improvement in strength and function.    Baseline ranges from 3/5 to 4+/5 - see objective exam (12/19/2021); significantly improved strength and firmness against resistance at shoulder and elbow - still some difficulty  holding and weakness in hands and forearm -  see objective exam (01/30/22); all but two motions have improved at least 1/2 MMT grade - see objective (03/14/2022); met (04/18/2022);   Time 12    Period Weeks    Status Met 04/18/2022     PT LONG TERM GOAL #4   Title Improve cervical AROM rotation to equal or greater than 45 degrees each direction and cervical extension to 15 degrees to improve ability to check blind spot when driving, view surroundings, and allow patient to complete valued activities with less difficulty.    Baseline Extension: 0 Rotation: R 10,  L 25 (12/19/2021); Extension: 5, Rotation: R 24, L 25 (01/30/2022); Extension: 0, Rotation:  R 28, L 32 (03/14/2022); Extension: 2, Rotation: R 30, L 32 (04/18/2022);   Time 12    Period Weeks    Status In progress     PT LONG TERM GOAL #5   Title Complete community, work and/or recreational activities without limitation due to current condition    Baseline Functional Limitations: difficulty with physical activities, prolonged sitting or standing, using B UE, picking things up, reaching, yardwork, using tractor, driving, riding in vehicle, turning his head, viewing his surroundings, traveling, rolling over at night, sleeping on his side, dressing, ADLs, IADLs (12/19/2021); patient reports improvements in all activities but continues to have difficulties such as difficulty picking up heavy things such as the steel chairs on his front porch, and it is still difficult to sleep. He always gets awoken at 4am. He still has difficulty turning his head and viewing his surroundings and this makes it more difficult to drive. He still drops things. He cannot turn his head enough to look behind him. He has difficulty sleeping on his side due to numbness in his fingers and pain (01/30/2022); Moving, driving, lifting has gotten easier for him, he doesn't feel like he has to strain as much, sleeping has been better, can do more before he wears out, he still has difficulty with activities especially lifting, prolonged  sitting and standing, viewing surroundings (03/14/2022); he states he can turn his head more, he feels most of these things are a little bit better but not back to normal, has trouble with lifting, feels he has better hand grip when performing HEP, difficulty with overhead activities (04/18/2022);    Time 12    Period Weeks    Status In-Progress             Plan    Clinical Impression Statement Patient has attended 20 physical therapy sessions since starting current episode of care on 12/19/2021. He has made good progress in UE strength, and some improvement in cervical spine ROM and functional participation. More recently his progress was disrupted by bout of bronchitis that required 2 ED visits and prevented him form completing his HEP or attending PT for ~ 2 weeks. Patient continues to get relief and improved ROM from regular physical therapy. Patient continues to have the most bothersome restriction in R cervical spine rotation and extension and he continues to be bothered by difficulty with paresthesia and limited UE muscular endurance affecting the right hand. Plan to continue working on improving ROM, functional activity tolerance, and UE strength for 9 more weeks to reach goals and return to maximum function.     Personal Factors and Comorbidities Comorbidity 3+;Past/Current Experience;Fitness;Time since onset of injury/illness/exacerbation;Education;Social Background    Comorbidities Relevant past medical history and comorbidities include cervical myelopathy leading to C2-6 laminectomy and  C2-T2 fusion performed 02/01/2021, HTN, DMII, hyperlipidemia, lumbar degenerative disc disease, current smoker, obesity, systolic murmur, chronic kidney disease, cervical myelopathy, gout, diverticulitis.    Examination-Activity Limitations Bathing;Hygiene/Grooming;Squat;Lift;Bed Mobility;Bend;Locomotion Level;Stand;Reach Overhead;Carry;Sit;Dressing;Sleep    Examination-Participation Restrictions Community  Activity;Yard Work;Driving;Interpersonal Relationship    Stability/Clinical Decision Making Evolving/Moderate complexity    Rehab Potential Good    PT Frequency 1-2x / week    PT Duration 9 weeks    PT Treatment/Interventions ADLs/Self Care Home Management;Aquatic Therapy;Cryotherapy;Moist Heat;Electrical Stimulation;Neuromuscular re-education;Therapeutic exercise;Therapeutic activities;Balance training;Patient/family education;Manual techniques;Dry needling;Passive range of motion    PT Next Visit Plan updated HEP as appropriate, postural strengthening, ROM, nerve glides and manual therapy as appropriate    PT Home Exercise Plan Medbridge Access  Code: [email or web address]    Consulted and Agree with Plan of Care Patient             Everlean Alstrom. Graylon Good, PT, DPT 04/18/22, 2:23 PM  [facility] [address] [place], [place] [address] P: [phone] I F: [phone]

## 2022-04-18 NOTE — Addendum Note (Signed)
Addended by: Rosita Kea R on: 04/18/2022 02:23 PM   Modules accepted: Orders

## 2022-04-24 ENCOUNTER — Encounter: Payer: Self-pay | Admitting: Physical Therapy

## 2022-04-24 ENCOUNTER — Ambulatory Visit: Payer: Medicaid Other | Admitting: Physical Therapy

## 2022-04-24 DIAGNOSIS — R293 Abnormal posture: Secondary | ICD-10-CM | POA: Diagnosis not present

## 2022-04-24 DIAGNOSIS — M542 Cervicalgia: Secondary | ICD-10-CM | POA: Diagnosis not present

## 2022-04-24 DIAGNOSIS — M5412 Radiculopathy, cervical region: Secondary | ICD-10-CM

## 2022-04-24 DIAGNOSIS — M6281 Muscle weakness (generalized): Secondary | ICD-10-CM

## 2022-04-24 DIAGNOSIS — M62838 Other muscle spasm: Secondary | ICD-10-CM | POA: Diagnosis not present

## 2022-04-24 NOTE — Therapy (Signed)
OUTPATIENT PHYSICAL THERAPY TREATMENT NOTE   Patient Name: BEREKET GERNERT MRN: [id number] DOB:1966-03-01, 56 y.o., male Today's Date: 04/24/2022  PCP: Delsa Grana, PA-C REFERRING PROVIDER: Hazeline Junker, PA Gara Kroner, MD surgeon)   PT End of Session - 04/24/22 1953     Visit Number 21    Number of Visits 42    Date for PT Re-Evaluation 06/20/22    Authorization Type [place] [employer]ICA[employer] BLUE reporting period from 04/18/2022    Authorization Time Period [place] [id number] 3/14-4/20 12 PT visits; HB Auth# [id number] 4/25-6/2 12 PT visits. [employer] order#[id number] 6/5-9/2 10 PT vistis    Authorization - Visit Number 54    Authorization - Number of Visits 24    Progress Note Due on Visit 20    PT Start Time 3343    PT Stop Time 1431    PT Time Calculation (min) 38 min    Activity Tolerance Patient tolerated treatment well    Behavior During Therapy WFL for tasks assessed/performed               Past Medical History:  Diagnosis Date   Bulging of cervical intervertebral disc    Diabetes mellitus without complication (HCC)    Gout    High cholesterol    Hypertension    Stress due to family tension 04/15/2019   Past Surgical History:  Procedure Laterality Date   CHOLECYSTECTOMY     COLONOSCOPY WITH PROPOFOL N/A 03/10/2020   Procedure: COLONOSCOPY WITH PROPOFOL;  Surgeon: Lucilla Lame, MD;  Location: [facility] ENDOSCOPY;  Service: Endoscopy;  Laterality: N/A;   Patient Active Problem List   Diagnosis Date Noted   S/P cervical spinal fusion 08/18/2021   Former heavy tobacco smoker 08/18/2021   Gout 08/18/2021   Other chronic pain 56/86/1683   Systolic murmur 72/90/2111   Tobacco use 10/26/2020   Polyp of ascending colon    Rectal polyp    Class 2 severe obesity with serious comorbidity and body mass index (BMI) of 39.0 to 39.9 in adult [facility]) 04/15/2019   Chronic kidney disease (CKD) stage G2/A3, mildly decreased glomerular filtration rate (GFR) between 60-89 mL/min/1.73  square meter and albuminuria creatinine ratio greater than 300 mg/g 04/15/2019   Proteinuria 04/15/2019   Hyperlipidemia associated with type 2 diabetes mellitus ([employer]) 03/18/2019   Diabetes mellitus ([place]) 03/18/2019   Idiopathic chronic gout of multiple sites without tophus 03/18/2019   Essential hypertension 03/18/2019   Dermatitis 03/18/2019   Lumbar degenerative disc disease 11/22/2015   Cervical myelopathy ([place]) 11/22/2015   Osteoarthritis of spine with radiculopathy, lumbar region 11/22/2015    REFERRING DIAG: s/p cervical spine fusion, cervical myelopathy  THERAPY DIAG:  Cervicalgia  Radiculopathy, cervical region  Muscle weakness (generalized)  Other muscle spasm  Abnormal posture  Rationale for Evaluation and Treatment: Rehabilitation  PERTINENT HISTORY: Patient is a 56 y.o. male who presents to outpatient physical therapy with a referral for medical diagnosis cervical spine fusion, cervical myelopathy. This patient's chief complaints consist of tightness, pain, restricted ROM at the cervical spine and back of head and B UE pain/paresthesia leading to the following functional deficits: difficulty with physical activities, prolonged sitting or standing, using B UE, picking things up, reaching, yardwork, using tractor, driving, riding in vehicle, turning his head, viewing his surroundings, traveling, rolling over at night, sleeping on his side, dressing, ADLs, IADLs. Relevant past medical history and comorbidities include cervical myelopathy leading to C2-6 laminectomy and C2-T2 fusion performed 02/01/2021, HTN, DMII, hyperlipidemia, lumbar degenerative  disc disease, current smoker, obesity, systolic murmur, chronic kidney disease, cervical myelopathy, gout, diverticulitis. Patient does not read/write well and needs assistance with completing paperwork.  PRECAUTIONS: don't lift over a certain amount of weight (unsure but thinks maybe 10#?), bending over, picking heavy stuff up (he  does not know if/when these precautions expire. He was told not to bend over or pick up anything heavy.   SUBJECTIVE: Pt reports his neck is really bothering him at the base of his scull that started this morning when he picked up 2-3 gallons of deisil fuel with just his right hand. He heard it pop in the top of his neck and since then the pain and tightness to this region has increased and has been about the same the last two hours. He also experienced an increase in numbness in all of the fingers in his right hand. He notices no change in strength in the left side. He denies anything has made it felt better. He has a headache around both of his eyes, he said earlier he lost his balance and it was "kind of like that" when he was going up the steps at the house. He denies feeling like something is in the back of his throat or any trouble swallowing or speaking. Rates pain 7/10 currently and states it was up to 9-10/10 earlier. Pain is at the base of his scull and is less intense over B UT. He put ice and the heating pad on it and neither one helped. He tried to get a hold of someone to drive him down but he ended up driving himself. He denies photosensitivity. He states he has not called his spine doctor because his phone has not been working right. Patient denies any changes in bowel, bladder. Patient states he has a follow up with his neurosurgeon in August.   PAIN:  Are you having pain? 7/10 currently and states it was up to 9-10/10 earlier. Pain is at the base of his scull and is less intense over B UT.  OBJECTIVE    SPECIAL TESTS Modified sharps-purser: negative  SPINE MOTION Cervical Spine AROM (measured prior to manual) *Indicates pain Flexion: 20 feels good Extension: 1 uncomfortable Side Flexion:        R 8       L10 Rotation   R 15 L 25 Uncomfortable tightness noted while performing all motions except flexion. Decreased pain an improved right hand paresthesia after ROM.       MUSCLE PERFORMANCE (MMT):  *Indicates pain 12/19/21 01/30/22 04/18/22 04/24/22  Joint/Motion R/L R/L R/L R/L  Shoulder         Flexion 3+/3+ 4+/4+ 5/4+ 5/4  Abduction (C5) 4/3+ 4/4 5/4+ 5/4+  External rotation 3/3 4+/4 5/4 5/4  Internal rotation 4/4 5/5 5/5 5/5  Elbow         Flexion (C6) 4/4 5/4 5/4+ 5/4  Extension (C7) 3/3 4/4 4/4+ 4/4  Wrist         Flexion (C7) 4-/4- 4-/4- 5/4+ 4+/4  Extension (C6) 3/3 4+/4 4+/4 4+/4+  Radial deviation 4+/4+ 4/3+ 4/4+ 4+/4-  Ulnar deviation (C8) 4+/4+ 4+/4 4+/4+ 4/4-  Hand         Thumb extension (C8) 4/4 4+/4+ 5/4+ 4/4  Finger abduction (T1) unclear WFL WNL WNL  Grip (C8) Decr. B ~4/~4 ~4+/~4+ 35/54  Comments:  12/19/21: Patient weakness most notable when unable to hold against resistance over several seconds. 01/30/22: firmer quality to all shoulder and elbow MMT. improved  grip strength but patient still cannot keep towel from sliding when holding it to perform self stretches (rotational SNAGs) for his neck.  04/18/2022: rarely drops towel while stretching demonstrating improved hand strength.  04/24/2022: grip strength measured with dynamometer, in lbs.     TODAY'S TREATMENT:  Therapeutic exercise: to centralize symptoms and improve ROM, strength, muscular endurance, and activity tolerance required for successful completion of functional activities.  - Measurements to assess for red flags or changes that would suggest need for urgent or non-urgent referral for further medical assessment/care (see above).  Manual therapy: to reduce pain and tissue tension, improve range of motion, neuromodulation, in order to promote improved ability to complete functional activities. SUPINE - STM to posterior cervical spine musculature focusing on B suboccipitals, cervical parapsinals, and UT/LS.  - PROM rotation with overpressure/oscillations as tolerated (grade II-III) each direction focusing on upper cervical spine. 1x10 each side with 10 second  hold/oscillations. - PROM cervical spine flexion with mild overpressure, ~ 5 second stretch.   Pt required multimodal cuing for proper technique and to facilitate improved neuromuscular control, strength, range of motion, and functional ability resulting in improved performance and form.     HOME EXERCISE PROGRAM: Access Code: [email or web address] URL: [email or web address] Date: 12/26/2021 Prepared by: Rosita Kea   Exercises Seated Assisted Cervical Rotation with Towel - 2 x daily - 2 sets - 10 reps - 5 seconds hold Standing Median Nerve Glide - 2 x daily - 1 sets - 15 reps   PATIENT EDUCATION: Education details: form/technique with exercise. Red flags that would necessitate call to surgeon, or  urgent or emergency care.  Person educated: Patient Education method: Explanation, Demonstration, Tactile cues, and Verbal cues Education comprehension: verbalized understanding, returned demonstration, verbal cues required, tactile cues required, and needs further education    PT Short Term Goals       PT SHORT TERM GOAL #1   Title Be independent with initial home exercise program for self-management of symptoms.    Baseline Initial HEP to be provided visit 2 as appropriate (12/19/2021);    Time 2    Period Weeks    Status Achieved    Target Date 01/02/22              PT Long Term Goals - 12/19/21 1112   TARGET DATE FOR ALL LONG TERM GOALS: 03/13/2022. UPDATED TO TARGET DATE OF 04/24/2022 FOR ALL UNMET GOALS (01/30/2022); UPDATED TO TARGET DATE OF 06/20/2022 FOR ALL UNMET GOALS (04/18/2022);    PT LONG TERM GOAL #1   Title Be independent with a long-term home exercise program for self-management of symptoms.    Baseline Initial HEP to be provided visit 2 as appropriate (12/19/2021); continues to participate well (01/30/2022); continues to participate well (03/14/2022); continues to participate well (04/18/2022);   Time 12    Period Weeks    Status In-Progress     PT LONG TERM GOAL #2    Title Demonstrate improved FOTO score by 10 units to demonstrate improvement in overall condition and self-reported functional ability.    Baseline to be measured at visit 2 as appropriate, patient needs assistance (11/21/2021); 38 at visit#2 (12/26/2021); 37 at visit#5 (01/04/2022); 46 at visit#9 (01/30/22); 47 at visit # 15 (03/14/2022);    Time 12    Period Weeks    Status In-Progress     PT LONG TERM GOAL #3   Title Pt will increase UE strength of by at least 1/2 MMT grade in order to demonstrate improvement  in strength and function.    Baseline ranges from 3/5 to 4+/5 - see objective exam (12/19/2021); significantly improved strength and firmness against resistance at shoulder and elbow - still some difficulty  holding and weakness in hands and forearm - see objective exam (01/30/22); all but two motions have improved at least 1/2 MMT grade - see objective (03/14/2022); met (04/18/2022);   Time 12    Period Weeks    Status Met 04/18/2022     PT LONG TERM GOAL #4   Title Improve cervical AROM rotation to equal or greater than 45 degrees each direction and cervical extension to 15 degrees to improve ability to check blind spot when driving, view surroundings, and allow patient to complete valued activities with less difficulty.    Baseline Extension: 0 Rotation: R 10,  L 25 (12/19/2021); Extension: 5, Rotation: R 24, L 25 (01/30/2022); Extension: 0, Rotation: R 28, L 32 (03/14/2022); Extension: 2, Rotation: R 30, L 32 (04/18/2022);   Time 12    Period Weeks    Status In progress     PT LONG TERM GOAL #5   Title Complete community, work and/or recreational activities without limitation due to current condition    Baseline Functional Limitations: difficulty with physical activities, prolonged sitting or standing, using B UE, picking things up, reaching, yardwork, using tractor, driving, riding in vehicle, turning his head, viewing his surroundings, traveling, rolling over at night, sleeping on his side,  dressing, ADLs, IADLs (12/19/2021); patient reports improvements in all activities but continues to have difficulties such as difficulty picking up heavy things such as the steel chairs on his front porch, and it is still difficult to sleep. He always gets awoken at 4am. He still has difficulty turning his head and viewing his surroundings and this makes it more difficult to drive. He still drops things. He cannot turn his head enough to look behind him. He has difficulty sleeping on his side due to numbness in his fingers and pain (01/30/2022); Moving, driving, lifting has gotten easier for him, he doesn't feel like he has to strain as much, sleeping has been better, can do more before he wears out, he still has difficulty with activities especially lifting, prolonged sitting and standing, viewing surroundings (03/14/2022); he states he can turn his head more, he feels most of these things are a little bit better but not back to normal, has trouble with lifting, feels he has better hand grip when performing HEP, difficulty with overhead activities (04/18/2022);    Time 12    Period Weeks    Status In-Progress             Plan    Clinical Impression Statement Patient arrives today with increased pain and concerns about injuring his neck or disrupting his surgery this morning after he lifted a diesel can of about 3-4 gallons quickly with his R UE, resulting in a pop in his neck followed by pain and increased tension in his upper cervical spine, headache around his eyes, and increased R hand paresthesia. Patient was re-examined today and screened for red flags that would require referral to urgent or emergent care or surgeon before his scheduled follow up with surgeon in August. Except for headache and mention of initial unsteadiness and eye blurriness (that has cleared), patient was negative for red flags such as diplopia, dysarthria, dysphagia, bowel or bladder changes, stumbling, or feeling a lump in his  throat. He was educated to seek medical assistance if he started  to experience any of these condition. His exam and manual therapy to the neck today improved his headache, R hand paresthesia, and pain/tension in the neck, although it did not completely resolve. Patient's symptoms consistent with irritation of neck condition without concerning injury based on response to exam, treatment, and lack of persistent red flags. Plan to monitor patient and update MD as appropriate. Patient reported plans to contact surgeon's office once he has a working phone available. Patient would benefit from continued management of limiting condition by skilled physical therapist to address remaining impairments and functional limitations to work towards stated goals and return to PLOF or maximal functional independence.     Personal Factors and Comorbidities Comorbidity 3+;Past/Current Experience;Fitness;Time since onset of injury/illness/exacerbation;Education;Social Background    Comorbidities Relevant past medical history and comorbidities include cervical myelopathy leading to C2-6 laminectomy and  C2-T2 fusion performed 02/01/2021, HTN, DMII, hyperlipidemia, lumbar degenerative disc disease, current smoker, obesity, systolic murmur, chronic kidney disease, cervical myelopathy, gout, diverticulitis.    Examination-Activity Limitations Bathing;Hygiene/Grooming;Squat;Lift;Bed Mobility;Bend;Locomotion Level;Stand;Reach Overhead;Carry;Sit;Dressing;Sleep    Examination-Participation Restrictions Community Activity;Yard Work;Driving;Interpersonal Relationship    Stability/Clinical Decision Making Evolving/Moderate complexity    Rehab Potential Good    PT Frequency 1-2x / week    PT Duration 9 weeks    PT Treatment/Interventions ADLs/Self Care Home Management;Aquatic Therapy;Cryotherapy;Moist Heat;Electrical Stimulation;Neuromuscular re-education;Therapeutic exercise;Therapeutic activities;Balance training;Patient/family  education;Manual techniques;Dry needling;Passive range of motion    PT Next Visit Plan updated HEP as appropriate, postural strengthening, ROM, nerve glides and manual therapy as appropriate    PT Home Exercise Plan Medbridge Access Code: [email or web address]    Consulted and Agree with Plan of Care Patient             Everlean Alstrom. Graylon Good, PT, DPT 04/24/22, 8:09 PM  [facility] [address] [place], [place] [address] P: [phone] I F: [phone]

## 2022-05-01 ENCOUNTER — Encounter: Payer: Self-pay | Admitting: Physical Therapy

## 2022-05-01 ENCOUNTER — Ambulatory Visit: Payer: Medicaid Other | Admitting: Physical Therapy

## 2022-05-01 DIAGNOSIS — M5412 Radiculopathy, cervical region: Secondary | ICD-10-CM

## 2022-05-01 DIAGNOSIS — M62838 Other muscle spasm: Secondary | ICD-10-CM

## 2022-05-01 DIAGNOSIS — M542 Cervicalgia: Secondary | ICD-10-CM

## 2022-05-01 DIAGNOSIS — R293 Abnormal posture: Secondary | ICD-10-CM

## 2022-05-01 DIAGNOSIS — M6281 Muscle weakness (generalized): Secondary | ICD-10-CM

## 2022-05-01 NOTE — Therapy (Signed)
OUTPATIENT PHYSICAL THERAPY TREATMENT NOTE   Patient Name: Cory Rivas MRN: [id number] DOB:Sep 08, 1966, 56 y.o., male Today's Date: 05/01/2022  PCP: Delsa Grana, PA-C REFERRING PROVIDER: Hazeline Junker, PA Gara Kroner, MD surgeon)   PT End of Session - 05/01/22 1344     Visit Number 22    Number of Visits 42    Date for PT Re-Evaluation 06/20/22    Authorization Type [place] MEDICAID HEALTHY BLUE reporting period from 04/18/2022    Authorization Time Period HB [id number] 3/14-4/20 12 PT visits; HB Auth# [id number] 4/25-6/2 12 PT visits. carelon order#[id number] 6/5-9/2 10 PT vistis    Authorization - Visit Number 21    Authorization - Number of Visits 24    Progress Note Due on Visit 20    PT Start Time 3888    PT Stop Time 1425    PT Time Calculation (min) 40 min    Activity Tolerance Patient tolerated treatment well    Behavior During Therapy WFL for tasks assessed/performed               Past Medical History:  Diagnosis Date   Bulging of cervical intervertebral disc    Diabetes mellitus without complication (HCC)    Gout    High cholesterol    Hypertension    Stress due to family tension 04/15/2019   Past Surgical History:  Procedure Laterality Date   CHOLECYSTECTOMY     COLONOSCOPY WITH PROPOFOL N/A 03/10/2020   Procedure: COLONOSCOPY WITH PROPOFOL;  Surgeon: Lucilla Lame, MD;  Location: [facility] ENDOSCOPY;  Service: Endoscopy;  Laterality: N/A;   Patient Active Problem List   Diagnosis Date Noted   S/P cervical spinal fusion 08/18/2021   Former heavy tobacco smoker 08/18/2021   Gout 08/18/2021   Other chronic pain 28/00/3491   Systolic murmur 79/15/0569   Tobacco use 10/26/2020   Polyp of ascending colon    Rectal polyp    Class 2 severe obesity with serious comorbidity and body mass index (BMI) of 39.0 to 39.9 in adult [facility]) 04/15/2019   Chronic kidney disease (CKD) stage G2/A3, mildly decreased glomerular filtration rate (GFR) between 60-89 mL/min/1.73  square meter and albuminuria creatinine ratio greater than 300 mg/g 04/15/2019   Proteinuria 04/15/2019   Hyperlipidemia associated with type 2 diabetes mellitus ([place]) 03/18/2019   Diabetes mellitus ([place]) 03/18/2019   Idiopathic chronic gout of multiple sites without tophus 03/18/2019   Essential hypertension 03/18/2019   Dermatitis 03/18/2019   Lumbar degenerative disc disease 11/22/2015   Cervical myelopathy ([place]) 11/22/2015   Osteoarthritis of spine with radiculopathy, lumbar region 11/22/2015    REFERRING DIAG: s/p cervical spine fusion, cervical myelopathy  THERAPY DIAG:  Cervicalgia  Radiculopathy, cervical region  Muscle weakness (generalized)  Other muscle spasm  Abnormal posture  Rationale for Evaluation and Treatment: Rehabilitation  PERTINENT HISTORY: Patient is a 56 y.o. male who presents to outpatient physical therapy with a referral for medical diagnosis cervical spine fusion, cervical myelopathy. This patient's chief complaints consist of tightness, pain, restricted ROM at the cervical spine and back of head and B UE pain/paresthesia leading to the following functional deficits: difficulty with physical activities, prolonged sitting or standing, using B UE, picking things up, reaching, yardwork, using tractor, driving, riding in vehicle, turning his head, viewing his surroundings, traveling, rolling over at night, sleeping on his side, dressing, ADLs, IADLs. Relevant past medical history and comorbidities include cervical myelopathy leading to C2-6 laminectomy and C2-T2 fusion performed 02/01/2021, HTN, DMII, hyperlipidemia, lumbar degenerative  disc disease, current smoker, obesity, systolic murmur, chronic kidney disease, cervical myelopathy, gout, diverticulitis. Patient does not read/write well and needs assistance with completing paperwork.  PRECAUTIONS: don't lift over a certain amount of weight (unsure but thinks maybe 10#?), bending over, picking heavy stuff up (he  does not know if/when these precautions expire. He was told not to bend over or pick up anything heavy.   SUBJECTIVE: Patient reports he is feeling a lot better since last PT session. He states his neck is tight and not longer painful. He continues to have paresthesia in right hand digits 2, 4, and 5. He did a little mowing this morning. He states he always feels worse when he is not able to be active. He feels like he will likely be ready for discharge from PT in 3 more visits.   PAIN:  Are you having pain? No pain just stiffness in the posterior cervical spine and upper thoracic spine. Paresthesia in right hand digits 2, 4, and 5.  TODAY'S TREATMENT:  Therapeutic exercise: to centralize symptoms and improve ROM, strength, muscular endurance, and activity tolerance required for successful completion of functional activities.  - NuStep level 4 using bilateral upper and lower extremities. Seat/handle setting 7/6. For improved extremity mobility, muscular endurance, and activity tolerance; and to induce the analgesic effect of aerobic exercise, stimulate improved joint nutrition, and prepare body structures and systems for following interventions. x 5 minutes. Average SPM = 104.  - seated shrugs, 3x10 with 7# DB in each hand.  - seated row at [place] with wide grip, chest braced against seat, 3x10 with 20#. Pillow folded over at chest.  - seated (pillow folded at chest) lat pull at [facility] machine, 3x10 with 25#.  - seated chest press with plus at [place], 3x10 with 15#. (Mild left elbow discomfort/popping).    Manual therapy: to reduce pain and tissue tension, improve range of motion, neuromodulation, in order to promote improved ability to complete functional activities. SUPINE - STM to posterior cervical spine musculature focusing on B suboccipitals, cervical parapsinals, and UT/LS.  - PROM rotation with overpressure/oscillations as tolerated (grade II-III) each direction focusing on  upper cervical spine. 1x10 each side with 10 second hold/oscillations. - PROM cervical spine flexion with mild overpressure, 1x10 with 5 second stretch.   Pt required multimodal cuing for proper technique and to facilitate improved neuromuscular control, strength, range of motion, and functional ability resulting in improved performance and form.     HOME EXERCISE PROGRAM: Access Code: [email or web address] URL: [email or web address] Date: 12/26/2021 Prepared by: Rosita Kea   Exercises Seated Assisted Cervical Rotation with Towel - 2 x daily - 2 sets - 10 reps - 5 seconds hold Standing Median Nerve Glide - 2 x daily - 1 sets - 15 reps   PATIENT EDUCATION: Education details: form/technique with exercise. Red flags that would necessitate call to surgeon, or  urgent or emergency care.  Person educated: Patient Education method: Explanation, Demonstration, Tactile cues, and Verbal cues Education comprehension: verbalized understanding, returned demonstration, verbal cues required, tactile cues required, and needs further education    PT Short Term Goals       PT SHORT TERM GOAL #1   Title Be independent with initial home exercise program for self-management of symptoms.    Baseline Initial HEP to be provided visit 2 as appropriate (12/19/2021);    Time 2    Period Weeks    Status Achieved    Target Date 01/02/22  PT Long Term Goals - 12/19/21 1112   TARGET DATE FOR ALL LONG TERM GOALS: 03/13/2022. UPDATED TO TARGET DATE OF 04/24/2022 FOR ALL UNMET GOALS (01/30/2022); UPDATED TO TARGET DATE OF 06/20/2022 FOR ALL UNMET GOALS (04/18/2022);    PT LONG TERM GOAL #1   Title Be independent with a long-term home exercise program for self-management of symptoms.    Baseline Initial HEP to be provided visit 2 as appropriate (12/19/2021); continues to participate well (01/30/2022); continues to participate well (03/14/2022); continues to participate well (04/18/2022);   Time 12     Period Weeks    Status In-Progress     PT LONG TERM GOAL #2   Title Demonstrate improved FOTO score by 10 units to demonstrate improvement in overall condition and self-reported functional ability.    Baseline to be measured at visit 2 as appropriate, patient needs assistance (11/21/2021); 38 at visit#2 (12/26/2021); 37 at visit#5 (01/04/2022); 46 at visit#9 (01/30/22); 47 at visit # 15 (03/14/2022);    Time 12    Period Weeks    Status In-Progress     PT LONG TERM GOAL #3   Title Pt will increase UE strength of by at least 1/2 MMT grade in order to demonstrate improvement in strength and function.    Baseline ranges from 3/5 to 4+/5 - see objective exam (12/19/2021); significantly improved strength and firmness against resistance at shoulder and elbow - still some difficulty  holding and weakness in hands and forearm - see objective exam (01/30/22); all but two motions have improved at least 1/2 MMT grade - see objective (03/14/2022); met (04/18/2022);   Time 12    Period Weeks    Status Met 04/18/2022     PT LONG TERM GOAL #4   Title Improve cervical AROM rotation to equal or greater than 45 degrees each direction and cervical extension to 15 degrees to improve ability to check blind spot when driving, view surroundings, and allow patient to complete valued activities with less difficulty.    Baseline Extension: 0 Rotation: R 10,  L 25 (12/19/2021); Extension: 5, Rotation: R 24, L 25 (01/30/2022); Extension: 0, Rotation: R 28, L 32 (03/14/2022); Extension: 2, Rotation: R 30, L 32 (04/18/2022);   Time 12    Period Weeks    Status In progress     PT LONG TERM GOAL #5   Title Complete community, work and/or recreational activities without limitation due to current condition    Baseline Functional Limitations: difficulty with physical activities, prolonged sitting or standing, using B UE, picking things up, reaching, yardwork, using tractor, driving, riding in vehicle, turning his head, viewing his surroundings,  traveling, rolling over at night, sleeping on his side, dressing, ADLs, IADLs (12/19/2021); patient reports improvements in all activities but continues to have difficulties such as difficulty picking up heavy things such as the steel chairs on his front porch, and it is still difficult to sleep. He always gets awoken at 4am. He still has difficulty turning his head and viewing his surroundings and this makes it more difficult to drive. He still drops things. He cannot turn his head enough to look behind him. He has difficulty sleeping on his side due to numbness in his fingers and pain (01/30/2022); Moving, driving, lifting has gotten easier for him, he doesn't feel like he has to strain as much, sleeping has been better, can do more before he wears out, he still has difficulty with activities especially lifting, prolonged sitting and standing, viewing surroundings (03/14/2022);  he states he can turn his head more, he feels most of these things are a little bit better but not back to normal, has trouble with lifting, feels he has better hand grip when performing HEP, difficulty with overhead activities (04/18/2022);    Time 12    Period Weeks    Status In-Progress             Plan    Clinical Impression Statement Patient arrives with improvement in symptoms today back to usual complaint of stiffness and R hand paresthesia. He was able to return to postural strengthening and continued manual therapy to improve ROM and decrease symptoms. Patient appears to be nearing readiness for discharge due to reaching maximum benefit from PT. Plan to discharge in 3 visits pending stability of symptoms. Patient would benefit from continued management of limiting condition by skilled physical therapist to address remaining impairments and functional limitations to work towards stated goals and return to PLOF or maximal functional independence.     Personal Factors and Comorbidities Comorbidity 3+;Past/Current  Experience;Fitness;Time since onset of injury/illness/exacerbation;Education;Social Background    Comorbidities Relevant past medical history and comorbidities include cervical myelopathy leading to C2-6 laminectomy and  C2-T2 fusion performed 02/01/2021, HTN, DMII, hyperlipidemia, lumbar degenerative disc disease, current smoker, obesity, systolic murmur, chronic kidney disease, cervical myelopathy, gout, diverticulitis.    Examination-Activity Limitations Bathing;Hygiene/Grooming;Squat;Lift;Bed Mobility;Bend;Locomotion Level;Stand;Reach Overhead;Carry;Sit;Dressing;Sleep    Examination-Participation Restrictions Community Activity;Yard Work;Driving;Interpersonal Relationship    Stability/Clinical Decision Making Evolving/Moderate complexity    Rehab Potential Good    PT Frequency 1-2x / week    PT Duration 9 weeks    PT Treatment/Interventions ADLs/Self Care Home Management;Aquatic Therapy;Cryotherapy;Moist Heat;Electrical Stimulation;Neuromuscular re-education;Therapeutic exercise;Therapeutic activities;Balance training;Patient/family education;Manual techniques;Dry needling;Passive range of motion    PT Next Visit Plan updated HEP as appropriate, postural strengthening, ROM, nerve glides and manual therapy as appropriate    PT Home Exercise Plan Medbridge Access Code: [email or web address]    Consulted and Agree with Plan of Care Patient             Everlean Alstrom. Graylon Good, PT, DPT 05/01/22, 2:27 PM  [facility] [address] [place], [place] [address] P: [phone] I F: [phone]

## 2022-05-08 ENCOUNTER — Other Ambulatory Visit: Payer: Self-pay

## 2022-05-08 ENCOUNTER — Other Ambulatory Visit: Payer: Self-pay | Admitting: *Deleted

## 2022-05-08 ENCOUNTER — Encounter: Payer: Self-pay | Admitting: Emergency Medicine

## 2022-05-08 ENCOUNTER — Emergency Department: Payer: Medicaid Other

## 2022-05-08 ENCOUNTER — Emergency Department
Admission: EM | Admit: 2022-05-08 | Discharge: 2022-05-08 | Disposition: A | Discharge status: Home / Self Care | Payer: Medicaid Other | Attending: Emergency Medicine | Admitting: Emergency Medicine

## 2022-05-08 ENCOUNTER — Ambulatory Visit: Payer: Medicaid Other | Admitting: Physical Therapy

## 2022-05-08 DIAGNOSIS — M25512 Pain in left shoulder: Secondary | ICD-10-CM | POA: Diagnosis not present

## 2022-05-08 DIAGNOSIS — Z87891 Personal history of nicotine dependence: Secondary | ICD-10-CM | POA: Diagnosis not present

## 2022-05-08 DIAGNOSIS — W1809XA Striking against other object with subsequent fall, initial encounter: Secondary | ICD-10-CM | POA: Diagnosis not present

## 2022-05-08 DIAGNOSIS — S060X0A Concussion without loss of consciousness, initial encounter: Secondary | ICD-10-CM | POA: Diagnosis not present

## 2022-05-08 DIAGNOSIS — S0990XA Unspecified injury of head, initial encounter: Secondary | ICD-10-CM | POA: Diagnosis not present

## 2022-05-08 DIAGNOSIS — N189 Chronic kidney disease, unspecified: Secondary | ICD-10-CM | POA: Insufficient documentation

## 2022-05-08 DIAGNOSIS — T1490XA Injury, unspecified, initial encounter: Secondary | ICD-10-CM | POA: Diagnosis not present

## 2022-05-08 DIAGNOSIS — I129 Hypertensive chronic kidney disease with stage 1 through stage 4 chronic kidney disease, or unspecified chronic kidney disease: Secondary | ICD-10-CM | POA: Diagnosis not present

## 2022-05-08 DIAGNOSIS — S060XAA Concussion with loss of consciousness status unknown, initial encounter: Secondary | ICD-10-CM

## 2022-05-08 DIAGNOSIS — E119 Type 2 diabetes mellitus without complications: Secondary | ICD-10-CM | POA: Diagnosis not present

## 2022-05-08 DIAGNOSIS — S4992XA Unspecified injury of left shoulder and upper arm, initial encounter: Secondary | ICD-10-CM | POA: Diagnosis not present

## 2022-05-08 DIAGNOSIS — R42 Dizziness and giddiness: Secondary | ICD-10-CM | POA: Diagnosis not present

## 2022-05-08 NOTE — Discharge Instructions (Signed)
Your CT scan did not show any injury to your brain or spine. You likely have a concussion which is causing your dizziness. Please avoid screen time or exercise while you are recovering and please follow-up with your primary doctor or neurosurgeon in about a week for repeat evaluation.

## 2022-05-08 NOTE — ED Triage Notes (Addendum)
Patient to ED via POV for head injury. Patient was mowing and hit a satellite that fell back and hit him on the back of head. Patient recently had fusion on neck. Unsure if LOC. C/o dizziness/ lightheadedness and "doesn't feel right."  Pain radiating from head down neck into left shoulder.

## 2022-05-08 NOTE — Patient Instructions (Signed)
Visit Information  Mr. [name]  - as a part of your Medicaid benefit, you are eligible for care management and care coordination services at no cost or copay. I was unable to reach you by phone today but would be happy to help you with your health related needs. Please feel free to call me @ [phone].   A member of the Managed Medicaid care management team will reach out to you again over the next 14 days.   Lurena Joiner RN, BSN [place]  [employer] manager

## 2022-05-08 NOTE — Patient Outreach (Signed)
  Medicaid Managed Care   Unsuccessful Attempt Note   05/08/2022 Name: Cory Rivas MRN: [id number] DOB: [birth year]  Referred by: Delsa Grana, PA-C Reason for referral : High Risk Managed Medicaid (Unsuccessful RNCM initial outreach)   An unsuccessful telephone outreach was attempted today. The patient was referred to the case management team for assistance with care management and care coordination.    Follow Up Plan: A HIPAA compliant phone message was left for the patient providing contact information and requesting a return call.    Lurena Joiner RN, BSN [place]  [employer] manager

## 2022-05-08 NOTE — ED Provider Notes (Signed)
[facility] Provider Note    Event Date/Time   First MD Initiated Contact with Patient 05/08/22 1611     (approximate)   History   Head Injury   HPI  Cory Rivas is a 56 y.o. male  with pmh cervical fusion, dm, htn, hkd who presents after a head injury.  Patient was mowing his lawn when he accidentally ran into a satellite which then fell and hit him in the back of the head.  He thinks he may have lost consciousness.  This happened at 10 AM.  Initially he felt somewhat dizzy and was slurring his speech apparently.  He does endorse pain down the left shoulder but denies numbness or weakness.  He has chronic numbness from bulging disc in the right arm.  Denies visual, vomiting is not on blood thinners.     Past Medical History:  Diagnosis Date   Bulging of cervical intervertebral disc    Diabetes mellitus without complication ([place])    Gout    High cholesterol    Hypertension    Stress due to family tension 04/15/2019    Patient Active Problem List   Diagnosis Date Noted   S/P cervical spinal fusion 08/18/2021   Former heavy tobacco smoker 08/18/2021   Gout 08/18/2021   Other chronic pain 27/51/7001   Systolic murmur 74/94/4967   Tobacco use 10/26/2020   Polyp of ascending colon    Rectal polyp    Class 2 severe obesity with serious comorbidity and body mass index (BMI) of 39.0 to 39.9 in adult [facility]) 04/15/2019   Chronic kidney disease (CKD) stage G2/A3, mildly decreased glomerular filtration rate (GFR) between 60-89 mL/min/1.73 square meter and albuminuria creatinine ratio greater than 300 mg/g 04/15/2019   Proteinuria 04/15/2019   Hyperlipidemia associated with type 2 diabetes mellitus ([place]) 03/18/2019   Diabetes mellitus ([place]) 03/18/2019   Idiopathic chronic gout of multiple sites without tophus 03/18/2019   Essential hypertension 03/18/2019   Dermatitis 03/18/2019   Lumbar degenerative disc disease 11/22/2015   Cervical myelopathy ([place])  11/22/2015   Osteoarthritis of spine with radiculopathy, lumbar region 11/22/2015     Physical Exam  Triage Vital Signs: ED Triage Vitals  Enc Vitals Group     BP 05/08/22 1328 (!) 187/111     Pulse Rate 05/08/22 1328 80     Resp 05/08/22 1328 18     Temp 05/08/22 1328 98 F (36.7 C)     Temp Source 05/08/22 1328 Oral     SpO2 05/08/22 1328 96 %     Weight 05/08/22 1329 240 lb (108.9 kg)     Height 05/08/22 1329 '5\' 8"'$  (1.727 m)     Head Circumference --      Peak Flow --      Pain Score 05/08/22 1328 4     Pain Loc --      Pain Edu? --      Excl. in [place]? --     Most recent vital signs: Vitals:   05/08/22 1328 05/08/22 1631  BP: (!) 187/111 (!) 180/100  Pulse: 80 78  Resp: 18 18  Temp: 98 F (36.7 C) 98 F (36.7 C)  SpO2: 96% 96%     General: Awake, no distress.  CV:  Good peripheral perfusion.  Resp:  Normal effort.  Abd:  No distention.  Neuro:             Awake, Alert, Oriented x 3  Other:  Aox3, nml  speech  PERRL, EOMI, face symmetric, nml tongue movement  4-5 strength in the right upper extremity compared to 5 out of 5 on the left, 5 out of 5 strength in bilateral lower extremities Sensation grossly intact in the BL upper and lower extremities  Finger-nose-finger intact BL  Mild swelling along the right posterior occiput No midline C-spine tenderness    ED Results / Procedures / Treatments  Labs (all labs ordered are listed, but only abnormal results are displayed) Labs Reviewed - No data to display   EKG     RADIOLOGY I reviewed and interpreted the CT scan of the brain which does not show any acute intracranial process    PROCEDURES:  Critical Care performed: No  Procedures   MEDICATIONS ORDERED IN ED: Medications - No data to display   IMPRESSION / MDM / [place] / ED COURSE  I reviewed the triage vital signs and the nursing notes.                              Patient's presentation is most consistent with acute  presentation with potential threat to life or bodily function.  Differential diagnosis includes, but is not limited to, traumatic intracranial hemorrhage, concussion, cervical spine fracture, ligamentous cervical spine injury  The patient is a 56 year old male with prior cervical fusion presents after a satellite fell onto his head.  He was driving a lawnmower which hit a large satellite that then hit him in the back of the head.  Unsure if he had loss of consciousness but initially did have some concussive symptoms including dizziness slurred speech.  Complains of pain down the left arm but denies any numbness or weakness.  He does have chronic paresthesias and weakness in the right arm due to prior neck issues.  On exam he is alert and oriented no longer slurring his speech.  Does have swelling in the posterior occiput on the right no open wounds.  No palpable skull fracture.  Neurologic exam other than the right-sided issues which are chronic is nonfocal.  CT of the head and C-spine were obtained which not show acute pathology.  Suspect concussion.  Discussed concussion precautions with the patient and follow-up with PCP in a week.        FINAL CLINICAL IMPRESSION(S) / ED DIAGNOSES   Final diagnoses:  Concussion with unknown loss of consciousness status, initial encounter  Injury of head, initial encounter     Rx / DC Orders   ED Discharge Orders     None        Note:  This document was prepared using Dragon voice recognition software and may include unintentional dictation errors.   Rada Hay, MD 05/08/22 1901

## 2022-05-08 NOTE — ED Provider Triage Note (Signed)
Emergency Medicine Provider Triage Evaluation Note  Cory Rivas , a 56 y.o. male  was evaluated in triage.  Pt complains of head and neck pain and feeling "woozy" after the satellite dish tipped over and hit him in the head while on his finishing mower on the tractor. He is also having some left shoulder pain. Unsure of loss of consciousness, but possible. Recent history of cervical fusion.   Physical Exam  There were no vitals taken for this visit. Gen:   Awake, no distress   Resp:  Normal effort  MSK:   Moves extremities without difficulty  Other:    Medical Decision Making  Medically screening exam initiated at 1:24 PM.  Appropriate orders placed.  CORIAN HANDLEY was informed that the remainder of the evaluation will be completed by another provider, this initial triage assessment does not replace that evaluation, and the importance of remaining in the ED until their evaluation is complete.    Victorino Dike, FNP 05/09/22 1004

## 2022-05-09 ENCOUNTER — Telehealth: Payer: Self-pay | Admitting: Family Medicine

## 2022-05-09 ENCOUNTER — Telehealth: Payer: Self-pay | Admitting: *Deleted

## 2022-05-09 NOTE — Telephone Encounter (Signed)
..   Medicaid Managed Care   Unsuccessful Outreach Note  05/09/2022 Name: Cory Rivas MRN: [id number] DOB: 01-14-1966  Referred by: Delsa Grana, PA-C Reason for referral : High Risk Managed Medicaid (I called the patient today to get him rescheduled with the MM RNCM. I left my name and number on his VM.)   A second unsuccessful telephone outreach was attempted today. The patient was referred to the case management team for assistance with care management and care coordination.   Follow Up Plan: The care management team will reach out to the patient again over the next 7 days.    [place]

## 2022-05-09 NOTE — Patient Outreach (Signed)
Care Coordination  05/09/2022  BASEM YANNUZZI 1966-07-23 [id number]  Transition Care Management Unsuccessful Follow-up Telephone Call  Date of discharge and from where:  05/08/22, [facility]  Attempts:  1st Attempt  Reason for unsuccessful TCM follow-up call:  Left voice message  Lurena Joiner RN, BSN [place] RN Care Coordinator

## 2022-05-10 ENCOUNTER — Telehealth: Payer: Self-pay

## 2022-05-10 ENCOUNTER — Telehealth: Payer: Self-pay | Admitting: Obstetrics and Gynecology

## 2022-05-10 NOTE — Patient Outreach (Signed)
Transition Care Management Unsuccessful Follow-up Telephone Call  Date of discharge and from where:  05/08/22 from [facility]  Attempts:  3rd Attempt  Reason for unsuccessful TCM follow-up call:  No answer/busy  Aida Raider RN, [place] Management Coordinator - Managed [place] High Risk [phone]

## 2022-05-10 NOTE — Patient Outreach (Signed)
Care Coordination  05/10/2022  DESI ROWE 23-Jul-1966 [id number]  Transition Care Management Unsuccessful Follow-up Telephone Call  Date of discharge and from where:  05/08/22 [facility]   Attempts:  2nd Attempt  Reason for unsuccessful TCM follow-up call:  Left voice message

## 2022-05-14 ENCOUNTER — Telehealth: Payer: Self-pay | Admitting: Family Medicine

## 2022-05-14 NOTE — Telephone Encounter (Signed)
..   Medicaid Managed Care   Unsuccessful Outreach Note  05/14/2022 Name: Cory Rivas MRN: [id number] DOB: Jul 06, 1966  Referred by: Delsa Grana, PA-C Reason for referral : High Risk Managed Medicaid (I called the patient today to get him rescheduled with the [facility]. I had to leave my name and number on his VM.)   Third unsuccessful telephone outreach was attempted today. The patient was referred to the case management team for assistance with care management and care coordination. The patient's primary care provider has been notified of our unsuccessful attempts to make or maintain contact with the patient. The care management team is pleased to engage with this patient at any time in the future should he/she be interested in assistance from the care management team.   Follow Up Plan: The care management team will reach out to the patient again over the next 14 days.   [place]

## 2022-05-15 ENCOUNTER — Ambulatory Visit: Payer: Medicaid Other | Admitting: Physical Therapy

## 2022-05-17 DIAGNOSIS — S060XAA Concussion with loss of consciousness status unknown, initial encounter: Secondary | ICD-10-CM | POA: Diagnosis not present

## 2022-05-17 DIAGNOSIS — R413 Other amnesia: Secondary | ICD-10-CM | POA: Diagnosis not present

## 2022-05-17 DIAGNOSIS — Z4789 Encounter for other orthopedic aftercare: Secondary | ICD-10-CM | POA: Diagnosis not present

## 2022-05-17 DIAGNOSIS — Z981 Arthrodesis status: Secondary | ICD-10-CM | POA: Diagnosis not present

## 2022-05-17 DIAGNOSIS — W228XXA Striking against or struck by other objects, initial encounter: Secondary | ICD-10-CM | POA: Diagnosis not present

## 2022-05-17 DIAGNOSIS — F17210 Nicotine dependence, cigarettes, uncomplicated: Secondary | ICD-10-CM | POA: Diagnosis not present

## 2022-05-17 DIAGNOSIS — F809 Developmental disorder of speech and language, unspecified: Secondary | ICD-10-CM | POA: Diagnosis not present

## 2022-05-17 DIAGNOSIS — M4322 Fusion of spine, cervical region: Secondary | ICD-10-CM | POA: Diagnosis not present

## 2022-05-22 ENCOUNTER — Ambulatory Visit: Payer: Medicaid Other | Attending: Physician Assistant | Admitting: Physical Therapy

## 2022-05-22 ENCOUNTER — Encounter: Payer: Self-pay | Admitting: Physical Therapy

## 2022-05-22 VITALS — BP 188/95 | HR 70

## 2022-05-22 DIAGNOSIS — M542 Cervicalgia: Secondary | ICD-10-CM | POA: Insufficient documentation

## 2022-05-22 DIAGNOSIS — R293 Abnormal posture: Secondary | ICD-10-CM | POA: Diagnosis not present

## 2022-05-22 DIAGNOSIS — M62838 Other muscle spasm: Secondary | ICD-10-CM | POA: Diagnosis not present

## 2022-05-22 DIAGNOSIS — M5412 Radiculopathy, cervical region: Secondary | ICD-10-CM | POA: Insufficient documentation

## 2022-05-22 DIAGNOSIS — M6281 Muscle weakness (generalized): Secondary | ICD-10-CM | POA: Insufficient documentation

## 2022-05-22 NOTE — Therapy (Signed)
OUTPATIENT PHYSICAL THERAPY TREATMENT NOTE   Patient Name: Cory Rivas MRN: [id number] DOB:Oct 22, 1965, 56 y.o., male Today's Date: 05/22/2022  PCP: Delsa Grana, PA-C REFERRING PROVIDER: Hazeline Junker, PA Gara Kroner, MD surgeon)   PT End of Session - 05/22/22 1329     Visit Number 23    Number of Visits 42    Date for PT Re-Evaluation 06/20/22    Authorization Type [place] MEDICAID HE[employer]Y BLUE reporting period from 04/18/2022    Authorization Time Period [place] [id number] 3/14-4/20 12 PT visits; HB Auth# [id number] 4/25-6/2 12 PT visits. carelon order#[id number] 6/5-9/2 10 PT vistis    Authorization - Visit Number 91    Authorization - Number of Visits 24    Progress Note Due on Visit 20    PT Start Time 6546    PT Stop Time 1343    PT Time Calculation (min) 38 min    Activity Tolerance Patient tolerated treatment well    Behavior During Therapy WFL for tasks assessed/performed                Past Medical History:  Diagnosis Date   Bulging of cervical intervertebral disc    Diabetes mellitus without complication (HCC)    Gout    High cholesterol    Hypertension    Stress due to family tension 04/15/2019   Past Surgical History:  Procedure Laterality Date   CHOLECYSTECTOMY     COLONOSCOPY WITH PROPOFOL N/A 03/10/2020   Procedure: COLONOSCOPY WITH PROPOFOL;  Surgeon: Lucilla Lame, MD;  Location: [facility] ENDOSCOPY;  Service: Endoscopy;  Laterality: N/A;   Patient Active Problem List   Diagnosis Date Noted   S/P cervical spinal fusion 08/18/2021   Former heavy tobacco smoker 08/18/2021   Gout 08/18/2021   Other chronic pain 50/35/4656   Systolic murmur 81/27/5170   Tobacco use 10/26/2020   Polyp of ascending colon    Rectal polyp    Class 2 severe obesity with serious comorbidity and body mass index (BMI) of 39.0 to 39.9 in adult [facility]) 04/15/2019   Chronic kidney disease (CKD) stage G2/A3, mildly decreased glomerular filtration rate (GFR) between 60-89 mL/min/1.73  square meter and albuminuria creatinine ratio greater than 300 mg/g 04/15/2019   Proteinuria 04/15/2019   Hyperlipidemia associated with type 2 diabetes mellitus ([place]) 03/18/2019   Diabetes mellitus ([place]) 03/18/2019   Idiopathic chronic gout of multiple sites without tophus 03/18/2019   Essential hypertension 03/18/2019   Dermatitis 03/18/2019   Lumbar degenerative disc disease 11/22/2015   Cervical myelopathy ([place]) 11/22/2015   Osteoarthritis of spine with radiculopathy, lumbar region 11/22/2015    REFERRING DIAG: s/p cervical spine fusion, cervical myelopathy  THERAPY DIAG:  Cervicalgia  Radiculopathy, cervical region  Muscle weakness (generalized)  Other muscle spasm  Abnormal posture  Rationale for Evaluation and Treatment: Rehabilitation  PERTINENT HISTORY: Patient is a 56 y.o. male who presents to outpatient physical therapy with a referral for medical diagnosis cervical spine fusion, cervical myelopathy. This patient's chief complaints consist of tightness, pain, restricted ROM at the cervical spine and back of head and B UE pain/paresthesia leading to the following functional deficits: difficulty with physical activities, prolonged sitting or standing, using B UE, picking things up, reaching, yardwork, using tractor, driving, riding in vehicle, turning his head, viewing his surroundings, traveling, rolling over at night, sleeping on his side, dressing, ADLs, IADLs. Relevant past medical history and comorbidities include cervical myelopathy leading to C2-6 laminectomy and C2-T2 fusion performed 02/01/2021, HTN, DMII, hyperlipidemia, lumbar  degenerative disc disease, current smoker, obesity, systolic murmur, chronic kidney disease, cervical myelopathy, gout, diverticulitis. Patient does not read/write well and needs assistance with completing paperwork.  PRECAUTIONS: don't lift over a certain amount of weight (unsure but thinks maybe 10#?), bending over, picking heavy stuff up (he  does not know if/when these precautions expire. He was told not to bend over or pick up anything heavy.   SUBJECTIVE: Patient reports that an old satellite dish hit him in the posterior right occipital region on 05/08/2022 while he was mowing and it the base of the dish support. He has been diagnosed with a concussion and referred to speech therapy and a concussion doctor. He had a cervical CT and xray that showed everything was okay in his neck surgery. He is continuing to have dizziness, memory loss, slurring of speech, and difficulty getting words out. He is allowed to drive again. He states he has a mild headache over the back of the head and over his eyes. He states his blood pressure has been high since he had difficulty with bronchitis and had treatment for that. States his blood glucose has been off since the concussion, but it is better controlled now. He states he broke his  phone and when he got a new one it was not the upgrade he had agreed to and he has to leave his house to get a working cell signal. He is still taking his blood pressure meds. He has been back on the tactor and doing his stretches for his neck. States he took ibuprofen for headache.   PAIN:  Are you having pain? 3/10 headache over back of head to above eyes. Paresthesia in right hand digits 4-5.  OBJECTIVE  Vitals:   05/22/22 1310 05/22/22 1318  BP: (!) 190/91 (!) 188/95  Pulse: 73 70  SpO2: 99% 99%   SPINE MOTION Cervical Spine AROM (measured after manual) *Indicates pain Flexion: 20 Extension: 2 Side Flexion:        R 5       L10 Rotation   R 24 L 34 Uncomfortable tightness noted while performing all motions.    TODAY'S TREATMENT:  Therapeutic exercise: to centralize symptoms and improve ROM, strength, muscular endurance, and activity tolerance required for successful completion of functional activities.  - vitals measurement prior to exercise (high see above, contacted PCP via instant message to let her  know, acknowledged).  - NuStep level 4 using bilateral upper and lower extremities. Seat/handle setting 7/6. For improved extremity mobility, muscular endurance, and activity tolerance; and to induce the analgesic effect of aerobic exercise, stimulate improved joint nutrition, and prepare body structures and systems for following interventions. x 5 minutes. Average SPM = 91.  - measurement of cervical spine AROM prior to manual therapy to get updated baseline since concussion.    Manual therapy: to reduce pain and tissue tension, improve range of motion, neuromodulation, in order to promote improved ability to complete functional activities. SUPINE - STM to posterior cervical spine musculature focusing on B suboccipitals, cervical parapsinals, and UT/LS.  - PROM rotation 1x5 each direction with minimal overpressure.  - Gentle cervical spine manual distraction intermittant  Pt required multimodal cuing for proper technique and to facilitate improved neuromuscular control, strength, range of motion, and functional ability resulting in improved performance and form.     HOME EXERCISE PROGRAM: Access Code: [email or web address] URL: [email or web address] Date: 12/26/2021 Prepared by: Rosita Kea   Exercises Seated Assisted Cervical Rotation with Towel - 2 x daily -  2 sets - 10 reps - 5 seconds hold Standing Median Nerve Glide - 2 x daily - 1 sets - 15 reps   PATIENT EDUCATION: Education details: educated on high blood pressure reading, symptoms of concussion, purpose of interventions.  Person educated: Patient Education method: Customer service manager Education comprehension: verbalized understanding and needs further education    PT Short Term Goals       PT SHORT TERM GOAL #1   Title Be independent with initial home exercise program for self-management of symptoms.    Baseline Initial HEP to be provided visit 2 as appropriate (12/19/2021);    Time 2    Period Weeks    Status  Achieved    Target Date 01/02/22              PT Long Term Goals - 12/19/21 1112   TARGET DATE FOR ALL LONG TERM GOALS: 03/13/2022. UPDATED TO TARGET DATE OF 04/24/2022 FOR ALL UNMET GOALS (01/30/2022); UPDATED TO TARGET DATE OF 06/20/2022 FOR ALL UNMET GOALS (04/18/2022);    PT LONG TERM GOAL #1   Title Be independent with a long-term home exercise program for self-management of symptoms.    Baseline Initial HEP to be provided visit 2 as appropriate (12/19/2021); continues to participate well (01/30/2022); continues to participate well (03/14/2022); continues to participate well (04/18/2022);   Time 12    Period Weeks    Status In-Progress     PT LONG TERM GOAL #2   Title Demonstrate improved FOTO score by 10 units to demonstrate improvement in overall condition and self-reported functional ability.    Baseline to be measured at visit 2 as appropriate, patient needs assistance (11/21/2021); 38 at visit#2 (12/26/2021); 37 at visit#5 (01/04/2022); 46 at visit#9 (01/30/22); 47 at visit # 15 (03/14/2022);    Time 12    Period Weeks    Status In-Progress     PT LONG TERM GOAL #3   Title Pt will increase UE strength of by at least 1/2 MMT grade in order to demonstrate improvement in strength and function.    Baseline ranges from 3/5 to 4+/5 - see objective exam (12/19/2021); significantly improved strength and firmness against resistance at shoulder and elbow - still some difficulty  holding and weakness in hands and forearm - see objective exam (01/30/22); all but two motions have improved at least 1/2 MMT grade - see objective (03/14/2022); met (04/18/2022);   Time 12    Period Weeks    Status Met 04/18/2022     PT LONG TERM GOAL #4   Title Improve cervical AROM rotation to equal or greater than 45 degrees each direction and cervical extension to 15 degrees to improve ability to check blind spot when driving, view surroundings, and allow patient to complete valued activities with less difficulty.    Baseline  Extension: 0 Rotation: R 10,  L 25 (12/19/2021); Extension: 5, Rotation: R 24, L 25 (01/30/2022); Extension: 0, Rotation: R 28, L 32 (03/14/2022); Extension: 2, Rotation: R 30, L 32 (04/18/2022);   Time 12    Period Weeks    Status In progress     PT LONG TERM GOAL #5   Title Complete community, work and/or recreational activities without limitation due to current condition    Baseline Functional Limitations: difficulty with physical activities, prolonged sitting or standing, using B UE, picking things up, reaching, yardwork, using tractor, driving, riding in vehicle, turning his head, viewing his surroundings, traveling, rolling over at night, sleeping on his  side, dressing, ADLs, IADLs (12/19/2021); patient reports improvements in all activities but continues to have difficulties such as difficulty picking up heavy things such as the steel chairs on his front porch, and it is still difficult to sleep. He always gets awoken at 4am. He still has difficulty turning his head and viewing his surroundings and this makes it more difficult to drive. He still drops things. He cannot turn his head enough to look behind him. He has difficulty sleeping on his side due to numbness in his fingers and pain (01/30/2022); Moving, driving, lifting has gotten easier for him, he doesn't feel like he has to strain as much, sleeping has been better, can do more before he wears out, he still has difficulty with activities especially lifting, prolonged sitting and standing, viewing surroundings (03/14/2022); he states he can turn his head more, he feels most of these things are a little bit better but not back to normal, has trouble with lifting, feels he has better hand grip when performing HEP, difficulty with overhead activities (04/18/2022);    Time 12    Period Weeks    Status In-Progress             Plan    Clinical Impression Statement Patient arrives with elevated blood pressure and symptom consistent with concussion  after being away from PT due to head injury while mowing the yard 2 weeks ago. Patient had no lasting worsening of symptoms during PT session and reported some relief following manual therapy. Patient's neck ROM continues to be limited and his posterior cervical spine muscles continue to be tight. Vigorous strength training limited this session due to heightened blood pressure. PCP made aware of elevated BP. Overall patient has lower pain and stiffness after experiencing head injury than PT expected. Patient would benefit from continued management of limiting condition by skilled physical therapist to address remaining impairments and functional limitations to work towards stated goals and return to PLOF or maximal functional independence.     Personal Factors and Comorbidities Comorbidity 3+;Past/Current Experience;Fitness;Time since onset of injury/illness/exacerbation;Education;Social Background    Comorbidities Relevant past medical history and comorbidities include cervical myelopathy leading to C2-6 laminectomy and  C2-T2 fusion performed 02/01/2021, HTN, DMII, hyperlipidemia, lumbar degenerative disc disease, current smoker, obesity, systolic murmur, chronic kidney disease, cervical myelopathy, gout, diverticulitis.    Examination-Activity Limitations Bathing;Hygiene/Grooming;Squat;Lift;Bed Mobility;Bend;Locomotion Level;Stand;Reach Overhead;Carry;Sit;Dressing;Sleep    Examination-Participation Restrictions Community Activity;Yard Work;Driving;Interpersonal Relationship    Stability/Clinical Decision Making Evolving/Moderate complexity    Rehab Potential Good    PT Frequency 1-2x / week    PT Duration 9 weeks    PT Treatment/Interventions ADLs/Self Care Home Management;Aquatic Therapy;Cryotherapy;Moist Heat;Electrical Stimulation;Neuromuscular re-education;Therapeutic exercise;Therapeutic activities;Balance training;Patient/family education;Manual techniques;Dry needling;Passive range of motion     PT Next Visit Plan updated HEP as appropriate, postural strengthening, ROM, nerve glides and manual therapy as appropriate    PT Home Exercise Plan Medbridge Access Code: [email or web address]    Consulted and Agree with Plan of Care Patient             Everlean Alstrom. Graylon Good, PT, DPT 05/22/22, 8:44 PM  [facility] [address] [place], [place] [address] P: [phone] I F: [phone]

## 2022-05-24 ENCOUNTER — Telehealth: Payer: Self-pay | Admitting: Family Medicine

## 2022-05-24 NOTE — Telephone Encounter (Signed)
..   Medicaid Managed Care   Unsuccessful Outreach Note  05/24/2022 Name: Cory Rivas MRN: [id number] DOB: Sep 14, 1966  Referred by: Delsa Grana, PA-C Reason for referral : High Risk Managed Medicaid (I called the patient today to get him rescheduled with the [facility]. I left my name and number on his VM.)   Third unsuccessful telephone outreach was attempted today. The patient was referred to the case management team for assistance with care management and care coordination. The patient's primary care provider has been notified of our unsuccessful attempts to make or maintain contact with the patient. The care management team is pleased to engage with this patient at any time in the future should he/she be interested in assistance from the care management team.   Follow Up Plan: We have been unable to make contact with the patient for follow up. The care management team is available to follow up with the patient after provider conversation with the patient regarding recommendation for care management engagement and subsequent re-referral to the care management team.   [place], [place]

## 2022-05-29 ENCOUNTER — Encounter: Payer: Medicaid Other | Admitting: Physical Therapy

## 2022-05-31 ENCOUNTER — Encounter: Payer: Self-pay | Admitting: Physical Therapy

## 2022-05-31 ENCOUNTER — Ambulatory Visit: Payer: Medicaid Other | Admitting: Physical Therapy

## 2022-05-31 VITALS — BP 200/98 | HR 78

## 2022-05-31 DIAGNOSIS — R293 Abnormal posture: Secondary | ICD-10-CM | POA: Diagnosis not present

## 2022-05-31 DIAGNOSIS — M5412 Radiculopathy, cervical region: Secondary | ICD-10-CM | POA: Diagnosis not present

## 2022-05-31 DIAGNOSIS — M542 Cervicalgia: Secondary | ICD-10-CM

## 2022-05-31 DIAGNOSIS — M6281 Muscle weakness (generalized): Secondary | ICD-10-CM | POA: Diagnosis not present

## 2022-05-31 DIAGNOSIS — M62838 Other muscle spasm: Secondary | ICD-10-CM

## 2022-05-31 NOTE — Therapy (Signed)
OUTPATIENT PHYSICAL THERAPY TREATMENT NOTE   Patient Name: Cory Rivas MRN: [id number] DOB:November 26, 1965, 56 y.o., male Today's Date: 05/31/2022  PCP: Delsa Grana, PA-C REFERRING PROVIDER: Hazeline Junker, PA Gara Kroner, MD surgeon)   PT End of Session - 05/31/22 1735     Visit Number 24    Number of Visits 42    Date for PT Re-Evaluation 06/20/22    Authorization Type [place] [employer]ICA[employer] BLUE reporting period from 04/18/2022    Authorization Time Period [place] [id number] 3/14-4/20 12 PT visits; HB Auth# [id number] 4/25-6/2 12 PT visits. [employer] order#[id number] 6/5-9/2 10 PT vistis    Authorization - Visit Number 23    Authorization - Number of Visits 24    Progress Note Due on Visit 20    PT Start Time 5726    PT Stop Time 1815    PT Time Calculation (min) 40 min    Activity Tolerance Patient tolerated treatment well    Behavior During Therapy WFL for tasks assessed/performed                 Past Medical History:  Diagnosis Date   Bulging of cervical intervertebral disc    Diabetes mellitus without complication (HCC)    Gout    High cholesterol    Hypertension    Stress due to family tension 04/15/2019   Past Surgical History:  Procedure Laterality Date   CHOLECYSTECTOMY     COLONOSCOPY WITH PROPOFOL N/A 03/10/2020   Procedure: COLONOSCOPY WITH PROPOFOL;  Surgeon: Lucilla Lame, MD;  Location: [facility] ENDOSCOPY;  Service: Endoscopy;  Laterality: N/A;   Patient Active Problem List   Diagnosis Date Noted   S/P cervical spinal fusion 08/18/2021   Former heavy tobacco smoker 08/18/2021   Gout 08/18/2021   Other chronic pain 20/35/5974   Systolic murmur 16/38/4536   Tobacco use 10/26/2020   Polyp of ascending colon    Rectal polyp    Class 2 severe obesity with serious comorbidity and body mass index (BMI) of 39.0 to 39.9 in adult [facility]) 04/15/2019   Chronic kidney disease (CKD) stage G2/A3, mildly decreased glomerular filtration rate (GFR) between 60-89  mL/min/1.73 square meter and albuminuria creatinine ratio greater than 300 mg/g 04/15/2019   Proteinuria 04/15/2019   Hyperlipidemia associated with type 2 diabetes mellitus ([place]) 03/18/2019   Diabetes mellitus ([place]) 03/18/2019   Idiopathic chronic gout of multiple sites without tophus 03/18/2019   Essential hypertension 03/18/2019   Dermatitis 03/18/2019   Lumbar degenerative disc disease 11/22/2015   Cervical myelopathy ([place]) 11/22/2015   Osteoarthritis of spine with radiculopathy, lumbar region 11/22/2015    REFERRING DIAG: s/p cervical spine fusion, cervical myelopathy  THERAPY DIAG:  Cervicalgia  Radiculopathy, cervical region  Muscle weakness (generalized)  Other muscle spasm  Abnormal posture  Rationale for Evaluation and Treatment: Rehabilitation  PERTINENT HISTORY: Patient is a 56 y.o. male who presents to outpatient physical therapy with a referral for medical diagnosis cervical spine fusion, cervical myelopathy. This patient's chief complaints consist of tightness, pain, restricted ROM at the cervical spine and back of head and B UE pain/paresthesia leading to the following functional deficits: difficulty with physical activities, prolonged sitting or standing, using B UE, picking things up, reaching, yardwork, using tractor, driving, riding in vehicle, turning his head, viewing his surroundings, traveling, rolling over at night, sleeping on his side, dressing, ADLs, IADLs. Relevant past medical history and comorbidities include cervical myelopathy leading to C2-6 laminectomy and C2-T2 fusion performed 02/01/2021, HTN, DMII, hyperlipidemia,  lumbar degenerative disc disease, current smoker, obesity, systolic murmur, chronic kidney disease, cervical myelopathy, gout, diverticulitis. Patient does not read/write well and needs assistance with completing paperwork.  PRECAUTIONS: don't lift over a certain amount of weight (unsure but thinks maybe 10#?), bending over, picking heavy  stuff up (he does not know if/when these precautions expire. He was told not to bend over or pick up anything heavy.   SUBJECTIVE: Patient reports he continues to have headaches but they are not as frequent as before. He is having trouble getting comfortable at night and sleeping because he gets a headache where he was hit on the right side and behind his right eye. His right fingers continue to experience paresthesia. He states he felt okay after last PT session. He states he still hasn't gotten his new phone and the one the company provided temporarily will not work at his home. He states his neck is currently feeling really stiff, especially over right lower trap. He took ibuprofen or tylenol to help his headache.   PAIN:  Are you having pain? Stiffness in neck and head. Paresthesia in right hand digits 4-5.  OBJECTIVE  Vitals:   05/31/22 1745 05/31/22 1759  BP: (!) 200/92 (!) 200/98  Pulse: 73 78  SpO2: 98% 98%  1745: adult large automatic cuff 1759: adult large manual cuff    TODAY'S TREATMENT:  Therapeutic exercise: to centralize symptoms and improve ROM, strength, muscular endurance, and activity tolerance required for successful completion of functional activities.  - NuStep level 5 using bilateral upper and lower extremities. Seat/handle setting 7/6. For improved extremity mobility, muscular endurance, and activity tolerance; and to induce the analgesic effect of aerobic exercise, stimulate improved joint nutrition, and prepare body structures and systems for following interventions. x 5 minutes. Average SPM = 95. - blood pressure check after approx 3:30 min rest. (200/92 mmHg - see above).  - blood pressure measurement after 5 more min of seated rest (200/98 mmHg - see above).  - education about [facility] and instructions from PCP to make an appointment to see them if BP is high or uncontrolled he needs to come in to see them.    Manual therapy: to reduce pain and tissue tension,  improve range of motion, neuromodulation, in order to promote improved ability to complete functional activities. SUPINE - STM to posterior cervical spine musculature focusing on B suboccipitals, cervical parapsinals, and UT/LS.   Pt required multimodal cuing for proper technique and to facilitate improved neuromuscular control, strength, range of motion, and functional ability resulting in improved performance and form.     HOME EXERCISE PROGRAM: Access Code: [email or web address] URL: [email or web address] Date: 12/26/2021 Prepared by: Rosita Kea   Exercises Seated Assisted Cervical Rotation with Towel - 2 x daily - 2 sets - 10 reps - 5 seconds hold Standing Median Nerve Glide - 2 x daily - 1 sets - 15 reps   PATIENT EDUCATION: Education details: educated on high blood pressure reading, symptoms of concussion, purpose of interventions.  Person educated: Patient Education method: Customer service manager Education comprehension: verbalized understanding and needs further education    PT Short Term Goals       PT SHORT TERM GOAL #1   Title Be independent with initial home exercise program for self-management of symptoms.    Baseline Initial HEP to be provided visit 2 as appropriate (12/19/2021);    Time 2    Period Weeks    Status Achieved    Target Date 01/02/22  PT Long Term Goals - 12/19/21 1112   TARGET DATE FOR ALL LONG TERM GOALS: 03/13/2022. UPDATED TO TARGET DATE OF 04/24/2022 FOR ALL UNMET GOALS (01/30/2022); UPDATED TO TARGET DATE OF 06/20/2022 FOR ALL UNMET GOALS (04/18/2022);    PT LONG TERM GOAL #1   Title Be independent with a long-term home exercise program for self-management of symptoms.    Baseline Initial HEP to be provided visit 2 as appropriate (12/19/2021); continues to participate well (01/30/2022); continues to participate well (03/14/2022); continues to participate well (04/18/2022);   Time 12    Period Weeks    Status In-Progress     PT  LONG TERM GOAL #2   Title Demonstrate improved FOTO score by 10 units to demonstrate improvement in overall condition and self-reported functional ability.    Baseline to be measured at visit 2 as appropriate, patient needs assistance (11/21/2021); 38 at visit#2 (12/26/2021); 37 at visit#5 (01/04/2022); 46 at visit#9 (01/30/22); 47 at visit # 15 (03/14/2022);    Time 12    Period Weeks    Status In-Progress     PT LONG TERM GOAL #3   Title Pt will increase UE strength of by at least 1/2 MMT grade in order to demonstrate improvement in strength and function.    Baseline ranges from 3/5 to 4+/5 - see objective exam (12/19/2021); significantly improved strength and firmness against resistance at shoulder and elbow - still some difficulty  holding and weakness in hands and forearm - see objective exam (01/30/22); all but two motions have improved at least 1/2 MMT grade - see objective (03/14/2022); met (04/18/2022);   Time 12    Period Weeks    Status Met 04/18/2022     PT LONG TERM GOAL #4   Title Improve cervical AROM rotation to equal or greater than 45 degrees each direction and cervical extension to 15 degrees to improve ability to check blind spot when driving, view surroundings, and allow patient to complete valued activities with less difficulty.    Baseline Extension: 0 Rotation: R 10,  L 25 (12/19/2021); Extension: 5, Rotation: R 24, L 25 (01/30/2022); Extension: 0, Rotation: R 28, L 32 (03/14/2022); Extension: 2, Rotation: R 30, L 32 (04/18/2022);   Time 12    Period Weeks    Status In progress     PT LONG TERM GOAL #5   Title Complete community, work and/or recreational activities without limitation due to current condition    Baseline Functional Limitations: difficulty with physical activities, prolonged sitting or standing, using B UE, picking things up, reaching, yardwork, using tractor, driving, riding in vehicle, turning his head, viewing his surroundings, traveling, rolling over at night, sleeping  on his side, dressing, ADLs, IADLs (12/19/2021); patient reports improvements in all activities but continues to have difficulties such as difficulty picking up heavy things such as the steel chairs on his front porch, and it is still difficult to sleep. He always gets awoken at 4am. He still has difficulty turning his head and viewing his surroundings and this makes it more difficult to drive. He still drops things. He cannot turn his head enough to look behind him. He has difficulty sleeping on his side due to numbness in his fingers and pain (01/30/2022); Moving, driving, lifting has gotten easier for him, he doesn't feel like he has to strain as much, sleeping has been better, can do more before he wears out, he still has difficulty with activities especially lifting, prolonged sitting and standing, viewing surroundings (03/14/2022);  he states he can turn his head more, he feels most of these things are a little bit better but not back to normal, has trouble with lifting, feels he has better hand grip when performing HEP, difficulty with overhead activities (04/18/2022);    Time 12    Period Weeks    Status In-Progress             Plan    Clinical Impression Statement Patient arrives with mildly improve concussion symptoms. Exercise discontinued after nustep due to finding BP elevated with systolic reading 469 mmHg even after 5 min of seated rest. PCP notified of high reading and relayed her instructions to patient that he is to make an appointment with them if his BP is high or uncontrolled. Manual therapy utilized to attempt to decrease tension in neck and upper back with limited results. Patient educated about importance of seeking emergency care if he becomes symptomatic. Patient would benefit from continued management of limiting condition by skilled physical therapist to address remaining impairments and functional limitations to work towards stated goals and return to PLOF or maximal functional  independence.     Personal Factors and Comorbidities Comorbidity 3+;Past/Current Experience;Fitness;Time since onset of injury/illness/exacerbation;Education;Social Background    Comorbidities Relevant past medical history and comorbidities include cervical myelopathy leading to C2-6 laminectomy and  C2-T2 fusion performed 02/01/2021, HTN, DMII, hyperlipidemia, lumbar degenerative disc disease, current smoker, obesity, systolic murmur, chronic kidney disease, cervical myelopathy, gout, diverticulitis.    Examination-Activity Limitations Bathing;Hygiene/Grooming;Squat;Lift;Bed Mobility;Bend;Locomotion Level;Stand;Reach Overhead;Carry;Sit;Dressing;Sleep    Examination-Participation Restrictions Community Activity;Yard Work;Driving;Interpersonal Relationship    Stability/Clinical Decision Making Evolving/Moderate complexity    Rehab Potential Good    PT Frequency 1-2x / week    PT Duration 9 weeks    PT Treatment/Interventions ADLs/Self Care Home Management;Aquatic Therapy;Cryotherapy;Moist Heat;Electrical Stimulation;Neuromuscular re-education;Therapeutic exercise;Therapeutic activities;Balance training;Patient/family education;Manual techniques;Dry needling;Passive range of motion    PT Next Visit Plan updated HEP as appropriate, postural strengthening, ROM, nerve glides and manual therapy as appropriate    PT Home Exercise Plan Medbridge Access Code: [email or web address]    Consulted and Agree with Plan of Care Patient             Everlean Alstrom. Graylon Good, PT, DPT 05/31/22, 7:02 PM  [facility] [address] [place], [place] [address] P: [phone] I F: [phone]

## 2022-06-05 ENCOUNTER — Encounter: Payer: Medicaid Other | Admitting: Physical Therapy

## 2022-06-06 ENCOUNTER — Ambulatory Visit: Payer: Medicaid Other | Admitting: Family Medicine

## 2022-06-06 DIAGNOSIS — I10 Essential (primary) hypertension: Secondary | ICD-10-CM

## 2022-06-06 DIAGNOSIS — E1169 Type 2 diabetes mellitus with other specified complication: Secondary | ICD-10-CM

## 2022-06-06 DIAGNOSIS — Z23 Encounter for immunization: Secondary | ICD-10-CM

## 2022-06-08 ENCOUNTER — Ambulatory Visit
Admission: RE | Admit: 2022-06-08 | Discharge: 2022-06-08 | Disposition: A | Discharge status: Home / Self Care | Payer: Medicaid Other | Source: Ambulatory Visit | Attending: Family Medicine | Admitting: Family Medicine

## 2022-06-08 ENCOUNTER — Other Ambulatory Visit
Admission: RE | Admit: 2022-06-08 | Discharge: 2022-06-08 | Disposition: A | Discharge status: Home / Self Care | Payer: Medicaid Other | Source: Ambulatory Visit | Attending: Family Medicine | Admitting: Family Medicine

## 2022-06-08 ENCOUNTER — Encounter: Payer: Self-pay | Admitting: Family Medicine

## 2022-06-08 ENCOUNTER — Other Ambulatory Visit: Payer: Self-pay

## 2022-06-08 ENCOUNTER — Ambulatory Visit: Payer: Medicaid Other | Admitting: Family Medicine

## 2022-06-08 VITALS — BP 170/76 | HR 91 | Temp 98.6°F | Resp 16 | Ht 67.0 in | Wt 241.2 lb

## 2022-06-08 DIAGNOSIS — I517 Cardiomegaly: Secondary | ICD-10-CM

## 2022-06-08 DIAGNOSIS — I5189 Other ill-defined heart diseases: Secondary | ICD-10-CM

## 2022-06-08 DIAGNOSIS — E1169 Type 2 diabetes mellitus with other specified complication: Secondary | ICD-10-CM | POA: Diagnosis not present

## 2022-06-08 DIAGNOSIS — R0600 Dyspnea, unspecified: Secondary | ICD-10-CM

## 2022-06-08 DIAGNOSIS — Z5181 Encounter for therapeutic drug level monitoring: Secondary | ICD-10-CM

## 2022-06-08 DIAGNOSIS — R0602 Shortness of breath: Secondary | ICD-10-CM | POA: Insufficient documentation

## 2022-06-08 DIAGNOSIS — E785 Hyperlipidemia, unspecified: Secondary | ICD-10-CM

## 2022-06-08 DIAGNOSIS — I10 Essential (primary) hypertension: Secondary | ICD-10-CM

## 2022-06-08 DIAGNOSIS — R051 Acute cough: Secondary | ICD-10-CM | POA: Diagnosis not present

## 2022-06-08 DIAGNOSIS — Z23 Encounter for immunization: Secondary | ICD-10-CM

## 2022-06-08 DIAGNOSIS — Z720 Tobacco use: Secondary | ICD-10-CM | POA: Diagnosis not present

## 2022-06-08 DIAGNOSIS — R0601 Orthopnea: Secondary | ICD-10-CM | POA: Diagnosis not present

## 2022-06-08 DIAGNOSIS — Z6837 Body mass index (BMI) 37.0-37.9, adult: Secondary | ICD-10-CM

## 2022-06-08 DIAGNOSIS — R079 Chest pain, unspecified: Secondary | ICD-10-CM | POA: Insufficient documentation

## 2022-06-08 LAB — COMPREHENSIVE METABOLIC PANEL
ALT: 13 U/L (ref 0–44)
AST: 19 U/L (ref 15–41)
Albumin: 3.8 g/dL (ref 3.5–5.0)
Alkaline Phosphatase: 63 U/L (ref 38–126)
Anion gap: 6 (ref 5–15)
BUN: 18 mg/dL (ref 6–20)
CO2: 27 mmol/L (ref 22–32)
Calcium: 8.7 mg/dL — ABNORMAL LOW (ref 8.9–10.3)
Chloride: 106 mmol/L (ref 98–111)
Creatinine, Ser: 1.29 mg/dL — ABNORMAL HIGH (ref 0.61–1.24)
GFR, Estimated: >60 mL/min (ref >60)
Glucose, Bld: 215 mg/dL — ABNORMAL HIGH (ref 70–99)
Potassium: 3.9 mmol/L (ref 3.5–5.1)
Sodium: 139 mmol/L (ref 135–145)
Total Bilirubin: 0.8 mg/dL (ref 0.3–1.2)
Total Protein: 7.3 g/dL (ref 6.5–8.1)

## 2022-06-08 LAB — LIPID PANEL
Cholesterol: 224 mg/dL — ABNORMAL HIGH (ref 0–200)
HDL: 36 mg/dL — ABNORMAL LOW (ref >40)
LDL Cholesterol: 135 mg/dL — ABNORMAL HIGH (ref 0–99)
Total CHOL/HDL Ratio: 6.2 RATIO
Triglycerides: 266 mg/dL — ABNORMAL HIGH (ref <150)
VLDL: 53 mg/dL — ABNORMAL HIGH (ref 0–40)

## 2022-06-08 LAB — CBC WITH DIFFERENTIAL/PLATELET
Abs Immature Granulocytes: 0.02 10*3/uL (ref 0.00–0.07)
Basophils Absolute: 0 10*3/uL (ref 0.0–0.1)
Basophils Relative: 0 %
Eosinophils Absolute: 0.2 10*3/uL (ref 0.0–0.5)
Eosinophils Relative: 2 %
HCT: 43.7 % (ref 39.0–52.0)
Hemoglobin: 14.5 g/dL (ref 13.0–17.0)
Immature Granulocytes: 0 %
Lymphocytes Relative: 18 %
Lymphs Abs: 1.7 10*3/uL (ref 0.7–4.0)
MCH: 27.8 pg (ref 26.0–34.0)
MCHC: 33.2 g/dL (ref 30.0–36.0)
MCV: 83.7 fL (ref 80.0–100.0)
Monocytes Absolute: 0.6 10*3/uL (ref 0.1–1.0)
Monocytes Relative: 6 %
Neutro Abs: 7.2 10*3/uL (ref 1.7–7.7)
Neutrophils Relative %: 74 %
Platelets: 217 10*3/uL (ref 150–400)
RBC: 5.22 MIL/uL (ref 4.22–5.81)
RDW: 13.2 % (ref 11.5–15.5)
WBC: 9.7 10*3/uL (ref 4.0–10.5)
nRBC: 0 % (ref 0.0–0.2)

## 2022-06-08 LAB — BRAIN NATRIURETIC PEPTIDE: B Natriuretic Peptide: 161.5 pg/mL — ABNORMAL HIGH (ref 0.0–100.0)

## 2022-06-08 MED ORDER — SHINGRIX 50 MCG/0.5ML IM SUSR: 0.5000 mL | Freq: Once | INTRAMUSCULAR | 1 refills | Status: AC

## 2022-06-08 MED ORDER — POTASSIUM CHLORIDE CRYS ER 20 MEQ PO TBCR: 20.0000 meq | EXTENDED_RELEASE_TABLET | Freq: Every day | ORAL | 0 refills | Status: DC | PRN

## 2022-06-08 MED ORDER — HYDRALAZINE HCL 25 MG PO TABS: 25.0000 mg | ORAL_TABLET | Freq: Three times a day (TID) | ORAL | 0 refills | Status: DC

## 2022-06-08 MED ORDER — BUDESONIDE-FORMOTEROL FUMARATE 80-4.5 MCG/ACT IN AERO: 2.0000 | INHALATION_SPRAY | Freq: Two times a day (BID) | RESPIRATORY_TRACT | 1 refills | Status: DC

## 2022-06-08 MED ORDER — FUROSEMIDE 20 MG PO TABS: 20.0000 mg | ORAL_TABLET | Freq: Every day | ORAL | 0 refills | Status: DC

## 2022-06-08 NOTE — Progress Notes (Signed)
Name: Cory Rivas   MRN: [id number]    DOB: 1966-08-22   Date:06/08/2022       Progress Note  Chief Complaint  Patient presents with   Follow-up   Hyperlipidemia   Hypertension   Diabetes     Subjective:   Cory Rivas is a 56 y.o. 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, LVH and grade I DD, mild left atrial dilation  BP Readings from Last 20 Encounters:  06/08/22 (!) 170/84  05/31/22 (!) 200/98  05/22/22 (!) 188/95  05/08/22 (!) 180/100  04/01/22 123/69  03/29/22 (!) 164/96  03/26/22 (!) 164/86  03/06/22 (!) 168/112  01/29/22 (!) 162/82  01/22/22 138/86  01/11/22 136/82  01/04/22 (!) 179/95  12/07/21 (!) 160/82  12/04/21 (!) 172/88  08/18/21 136/82  07/03/21 (!) 169/96  01/26/21 128/72  11/10/20 130/80  10/31/20 140/90  10/03/20 (!) 172/102  On norvasc and lisinopril - doses have been increased and BP still not at goal  DM:  Denies: Polyuria, polydipsia, vision changes, neuropathy, hypoglycemia Recent pertinent labs: Lab Results  Component Value Date   HGBA1C 6.5 01/02/2022   HGBA1C 6.6 (H) 12/04/2021   HGBA1C 11.0 06/29/2021   Hyperlipidemia: Currently treated with crestor and zetia - previously poor compliance - due for recheck Last Lipids: Lab Results  Component Value Date   CHOL 209 (H) 03/06/2022   HDL 33 (L) 03/06/2022   LDLCALC 133 (H) 03/06/2022   LDLDIRECT 135 (H) 08/18/2021   TRIG 280 (H) 03/06/2022   CHOLHDL 6.3 (H) 03/06/2022   -   Current Outpatient Medications:    acetaminophen (TYLENOL) 325 MG tablet, SMARTSIG:3 Tablet(s) By Mouth Every 8 Hours PRN, Disp: , Rfl:    allopurinol (ZYLOPRIM) 100 MG tablet, Take 1 tablet (100 mg total) by mouth daily. Take with 300 mg tab (for total of 400 mg once  daily), Disp: 90 tablet, Rfl: 3   allopurinol (ZYLOPRIM) 300 MG tablet, Take one 300 mg tab po daily WITH 100 mg tab for total of 400 mg once daily for gout, Disp: 90 tablet, Rfl: 3   amLODipine (NORVASC) 10 MG tablet, Take 1 tablet (10 mg total) by mouth daily., Disp: 90 tablet, Rfl: 3   Baclofen 5 MG TABS, Take 5-10 mg by mouth 3 (three) times daily as needed (msk pain or spasms)., Disp: 90 tablet, Rfl: 3   Continuous Blood Gluc Transmit (DEXCOM G6 TRANSMITTER) MISC, Use 1 each every 3 (three) months, Disp: , Rfl:    diphenhydrAMINE (BENADRYL ALLERGY) 25 MG tablet, Take 1 tablet (25 mg total) by mouth every 6 (six) hours as needed for allergies., Disp: 30 tablet, Rfl: 0   Dulaglutide 1.5 MG/0.5ML SOPN, Inject into the skin., Disp: , Rfl:    DULoxetine (CYMBALTA) 30 MG capsule, Take 1 capsule (30 mg total) by mouth 2 (two) times daily., Disp: 180 capsule, Rfl: 1   ezetimibe (ZETIA) 10 MG tablet, Take 1 tablet (10 mg total) by mouth daily., Disp: 90 tablet, Rfl: 3   furosemide (LASIX) 20 MG tablet, Take 1 tablet (20 mg total) by mouth daily., Disp: 5 tablet, Rfl: 0   ibuprofen (ADVIL) 800 MG tablet, Take 800 mg by mouth every 6 (six) hours as needed., Disp: ,  Rfl:    indomethacin (INDOCIN) 25 MG capsule, TAKE 2 TABLETS (50 MG) BY 3 TIMES DAILY FOR 2 DAYS AT ONSET OF GOUT FLARE UP, THEN TAKE 1('25MG'$ )TABLET BY MOUTH FOR 3 DAYS, Disp: 20 capsule, Rfl: 3   insulin lispro (HUMALOG) 100 UNIT/ML KwikPen, By correction scale based on pre-meal blood sugar. 2 units/ 50 over 150. TDD up to 45 units, Disp: , Rfl:    levocetirizine (XYZAL) 5 MG tablet, Take 1 tablet (5 mg total) by mouth every evening., Disp: 90 tablet, Rfl: 1   lisinopril (ZESTRIL) 40 MG tablet, Take 1 tablet (40 mg total) by mouth daily., Disp: 90 tablet, Rfl: 3   metFORMIN (GLUCOPHAGE) 1000 MG tablet, TAKE 1 TABLET BY MOUTH TWICE A DAY WITH A MEAL, Disp: 180 tablet, Rfl: 3   mometasone (NASONEX) 50 MCG/ACT nasal spray, Place 2 sprays into the  nose daily., Disp: 1 each, Rfl: 12   Olopatadine HCl 0.2 % SOLN, Apply 1 drop to eye 2 (two) times daily as needed (eye allergies)., Disp: 2.5 mL, Rfl: 3   rosuvastatin (CRESTOR) 40 MG tablet, Take 1 tablet (40 mg total) by mouth daily., Disp: 90 tablet, Rfl: 3   triamcinolone ointment (KENALOG) 0.5 %, Apply 1 application. topically 2 (two) times daily., Disp: 30 g, Rfl: 0   TRULICITY 3 JT/7.0VX SOPN, Inject into the skin., Disp: , Rfl:    VENTOLIN HFA 108 (90 Base) MCG/ACT inhaler, INHALE 2 PUFFS INTO THE LUNGS EVERY 6 HOURS AS NEEDED FOR WHEEZING OR SHORTNESS OF BREATH, Disp: 18 g, Rfl: 2   insulin glargine (LANTUS SOLOSTAR) 100 UNIT/ML Solostar Pen, Inject into the skin., Disp: , Rfl:   Patient Active Problem List   Diagnosis Date Noted   S/P cervical spinal fusion 08/18/2021   Former heavy tobacco smoker 08/18/2021   Gout 08/18/2021   Other chronic pain 79/39/0300   Systolic murmur 92/33/0076   Tobacco use 10/26/2020   Polyp of ascending colon    Rectal polyp    Class 2 severe obesity with serious comorbidity and body mass index (BMI) of 39.0 to 39.9 in adult [facility]) 04/15/2019   Chronic kidney disease (CKD) stage G2/A3, mildly decreased glomerular filtration rate (GFR) between 60-89 mL/min/1.73 square meter and albuminuria creatinine ratio greater than 300 mg/g 04/15/2019   Proteinuria 04/15/2019   Hyperlipidemia associated with type 2 diabetes mellitus ([place]) 03/18/2019   Diabetes mellitus ([place]) 03/18/2019   Idiopathic chronic gout of multiple sites without tophus 03/18/2019   Essential hypertension 03/18/2019   Dermatitis 03/18/2019   Lumbar degenerative disc disease 11/22/2015   Cervical myelopathy ([place]) 11/22/2015   Osteoarthritis of spine with radiculopathy, lumbar region 11/22/2015    Past Surgical History:  Procedure Laterality Date   CHOLECYSTECTOMY     COLONOSCOPY WITH PROPOFOL N/A 03/10/2020   Procedure: COLONOSCOPY WITH PROPOFOL;  Surgeon: Lucilla Lame, MD;  Location:  [facility] ENDOSCOPY;  Service: Endoscopy;  Laterality: N/A;    Family History  Problem Relation Age of Onset   Heart disease Mother    Diabetes Mother    Hyperlipidemia Mother    Hypertension Mother    Heart attack Mother    Lung cancer Father    Hypertension Father    Stroke Father    AAA (abdominal aortic aneurysm) Maternal Grandmother    Heart attack Maternal Grandmother    Diabetes Maternal Grandfather     Social History   Tobacco Use   Smoking status: Every Day    Packs/day: 1.00    Years:  37.00    Total pack years: 37.00    Types: Cigarettes    Start date: 10/19/1989   Smokeless tobacco: Never   Tobacco comments:    30+ years hx smoking 2 ppd, stopped smoking briefly in April 2022 for a surgery and then restarted smoking about 1/2 ppd last 4-5 months   Vaping Use   Vaping Use: Never used  Substance Use Topics   Alcohol use: No   Drug use: No     Allergies  Allergen Reactions   Oxycodone Nausea And Vomiting    Health Maintenance  Topic Date Due   Zoster Vaccines- Shingrix (1 of 2) Never done   COVID-19 Vaccine (4 - Pfizer series) 09/07/2020   INFLUENZA VACCINE  05/15/2022   FOOT EXAM  05/25/2022   HEMOGLOBIN A1C  07/05/2022   OPHTHALMOLOGY EXAM  02/06/2023   COLONOSCOPY (Pts 45-6yr Insurance coverage will need to be confirmed)  03/11/2023   TETANUS/TDAP  04/14/2029   Hepatitis C Screening  Completed   HIV Screening  Completed   HPV VACCINES  Aged Out    Chart Review Today: I personally reviewed active problem list, medication list, allergies, family history, social history, health maintenance, notes from last encounter, lab results, imaging with the patient/caregiver today.   Review of Systems  Constitutional:  Positive for activity change and fatigue. Negative for appetite change, chills and diaphoresis.  HENT: Negative.    Eyes: Negative.   Respiratory:  Positive for cough, chest tightness and shortness of breath.   Cardiovascular:  Positive for  chest pain. Negative for palpitations and leg swelling.  Gastrointestinal:  Positive for nausea. Negative for abdominal pain.  Endocrine: Negative.   Genitourinary: Negative.   Musculoskeletal:  Positive for neck pain and neck stiffness.  Skin: Negative.   Allergic/Immunologic: Negative.   Neurological:  Positive for headaches.  Hematological: Negative.   Psychiatric/Behavioral: Negative.    All other systems reviewed and are negative.    Objective:   Vitals:   06/08/22 1339 06/08/22 1416  BP: (!) 170/84 (!) 170/76  Pulse: 91   Resp: 16   Temp: 98.6 F (37 C)   TempSrc: Oral   SpO2: 96%   Weight: 241 lb 3.2 oz (109.4 kg)   Height: '5\' 7"'$  (1.702 m)     Body mass index is 37.78 kg/m.  Physical Exam Vitals and nursing note reviewed.  Constitutional:      General: He is not in acute distress.    Appearance: Normal appearance. He is well-developed. He is not ill-appearing, toxic-appearing or diaphoretic.     Interventions: Face mask in place.     Comments: Appears uncomfortable  HENT:     Head: Normocephalic and atraumatic.     Jaw: No trismus.     Right Ear: External ear normal.     Left Ear: External ear normal.  Eyes:     General: Lids are normal. No scleral icterus.       Right eye: No discharge.        Left eye: No discharge.     Conjunctiva/sclera: Conjunctivae normal.  Neck:     Trachea: Trachea and phonation normal. No tracheal deviation.  Cardiovascular:     Rate and Rhythm: Normal rate and regular rhythm.     Pulses: Normal pulses.          Radial pulses are 2+ on the right side and 2+ on the left side.       Posterior tibial pulses are 2+ on  the right side and 2+ on the left side.     Heart sounds: Normal heart sounds. No murmur heard.    No friction rub. No gallop.  Pulmonary:     Effort: Pulmonary effort is normal. No respiratory distress.     Breath sounds: Normal breath sounds. No stridor. No wheezing, rhonchi or rales.  Abdominal:     General:  Bowel sounds are normal. There is no distension.     Palpations: Abdomen is soft.  Musculoskeletal:     Right lower leg: No edema.     Left lower leg: No edema.  Skin:    General: Skin is warm and dry.     Coloration: Skin is not jaundiced.     Findings: No rash.     Nails: There is no clubbing.  Neurological:     Mental Status: He is alert. Mental status is at baseline.     Cranial Nerves: No dysarthria or facial asymmetry.     Motor: No tremor or abnormal muscle tone.     Gait: Gait normal.  Psychiatric:        Mood and Affect: Mood normal.        Speech: Speech normal.        Behavior: Behavior normal. Behavior is cooperative.         Assessment & Plan:   Problem List Items Addressed This Visit       Cardiovascular and Mediastinum   LVH (left ventricular hypertrophy)   Relevant Orders   Ambulatory referral to Cardiology   DG Chest 2 View (Completed)   Brain natriuretic peptide (Completed)   Comprehensive metabolic panel (Completed)   EKG 12-Lead   Accelerated hypertension - Primary   Relevant Orders   Ambulatory referral to Cardiology   DG Chest 2 View (Completed)   Brain natriuretic peptide (Completed)   Comprehensive metabolic panel (Completed)   EKG 12-Lead     Endocrine   Hyperlipidemia associated with type 2 diabetes mellitus ([place])   Relevant Orders   Ambulatory referral to Cardiology   Lipid panel (Completed)   Comprehensive metabolic panel (Completed)   Diabetes mellitus ([place])   Relevant Orders   Ambulatory referral to Cardiology   Lipid panel (Completed)   Comprehensive metabolic panel (Completed)     Other   Tobacco use   Relevant Orders   Ambulatory referral to Cardiology   Grade I diastolic dysfunction   Relevant Orders   Ambulatory referral to Cardiology   Brain natriuretic peptide (Completed)   Comprehensive metabolic panel (Completed)   EKG 12-Lead   Other Visit Diagnoses     Class 2 severe obesity with serious comorbidity and  body mass index (BMI) of 37.0 to 37.9 in adult, unspecified obesity type (HCC)       Relevant Orders   Ambulatory referral to Cardiology   Comprehensive metabolic panel (Completed)   Orthopnea       will eval for CHF with BNP, possibly try lasix, prior ECHO showed no HF but some LVH   Relevant Orders   DG Chest 2 View (Completed)   Brain natriuretic peptide (Completed)   Comprehensive metabolic panel (Completed)   Dyspnea, unspecified type       unclear if pulm or cardiac etiology or both, concern for HTN urgency, CHF, COPD exacerbation   Relevant Orders   DG Chest 2 View (Completed)   Brain natriuretic peptide (Completed)   Comprehensive metabolic panel (Completed)   Encounter for medication monitoring  Relevant Orders   Brain natriuretic peptide (Completed)   CBC with Differential/Platelet (Completed)   Comprehensive metabolic panel (Completed)   Acute cough       inhalers have been helpful, significant smoker, discussed maintenence inhaler, rescue inhaler use   Need for shingles vaccine            BP rechecked today Ambulatory pulse ox and EKG done in office as able Then labs sent to hospital to be completed stat and pt to wait at labs incase he needs to check in to ED He endorses SOB, orthopnea, severe HA, no palpitations or LE edema  Pulse Readings from Last 3 Encounters:  06/08/22 91  05/31/22 78  05/22/22 70   No drop in SpO2 with ambulation about clinic, pulse ox checked when lying flat at 97%   06/08/22 1400  Lap 1 (250 feet)  HR 94  02 Sat 96  Lap 2 (250 feet)  HR 102  02 Sat 95  Lap 3 (250 feet)  HR 113  02 Sat 95     Concern with TWI which are NOT new - but new as of 03/2022 ED visit -  Pt will be referred back to cardiology - likely needs a stress test   If pt has elevated BNP or signs of CHF on cxr will send in lasix If no signs of CHF will lower bp with hydralazine 25 mg TID with close f/up in 1-2 weeks in office    Delsa Grana,  PA-C 06/08/22 1:46 PM

## 2022-06-12 ENCOUNTER — Encounter: Payer: Medicaid Other | Admitting: Physical Therapy

## 2022-06-12 ENCOUNTER — Other Ambulatory Visit: Payer: Self-pay | Admitting: Family Medicine

## 2022-06-13 ENCOUNTER — Encounter: Payer: Self-pay | Admitting: Physical Therapy

## 2022-06-13 ENCOUNTER — Ambulatory Visit: Payer: Medicaid Other | Admitting: Physical Therapy

## 2022-06-13 VITALS — BP 164/87 | HR 75

## 2022-06-13 DIAGNOSIS — R293 Abnormal posture: Secondary | ICD-10-CM | POA: Diagnosis not present

## 2022-06-13 DIAGNOSIS — M62838 Other muscle spasm: Secondary | ICD-10-CM | POA: Diagnosis not present

## 2022-06-13 DIAGNOSIS — M6281 Muscle weakness (generalized): Secondary | ICD-10-CM | POA: Diagnosis not present

## 2022-06-13 DIAGNOSIS — M5412 Radiculopathy, cervical region: Secondary | ICD-10-CM | POA: Diagnosis not present

## 2022-06-13 DIAGNOSIS — M542 Cervicalgia: Secondary | ICD-10-CM | POA: Diagnosis not present

## 2022-06-13 NOTE — Therapy (Signed)
OUTPATIENT PHYSICAL THERAPY TREATMENT NOTE / DISCHARGE SUMMARY Dates of reporting from 12/19/2021 to 06/13/2022   Patient Name: Cory Rivas MRN: [id number] DOB:1966/02/19, 56 y.o., male Today's Date: 06/13/2022  PCP: Delsa Grana, PA-C REFERRING PROVIDER: Hazeline Junker, PA Gara Kroner, MD surgeon)   PT End of Session - 06/13/22 [phone]     Visit Number 25    Number of Visits 42    Date for PT Re-Evaluation 06/20/22    Authorization Type [place] [employer]ICA[employer]UE reporting period from 04/18/2022    Authorization Time Period HB [id number] 3/14-4/20 12 PT visits; HB Auth# [id number] 4/25-6/2 12 PT visits. [employer] order#[id number] 6/5-9/2 10 PT vistis    Authorization - Visit Number 24    Authorization - Number of Visits 24    Progress Note Due on Visit 20    PT Start Time 0905    PT Stop Time 0943    PT Time Calculation (min) 38 min    Activity Tolerance Patient tolerated treatment well    Behavior During Therapy [facility] for tasks assessed/performed             Past Medical History:  Diagnosis Date   Bulging of cervical intervertebral disc    Diabetes mellitus without complication (HCC)    Gout    High cholesterol    Hypertension    Stress due to family tension 04/15/2019   Past Surgical History:  Procedure Laterality Date   CHOLECYSTECTOMY     COLONOSCOPY WITH PROPOFOL N/A 03/10/2020   Procedure: COLONOSCOPY WITH PROPOFOL;  Surgeon: Lucilla Lame, MD;  Location: [facility] ENDOSCOPY;  Service: Endoscopy;  Laterality: N/A;   Patient Active Problem List   Diagnosis Date Noted   Grade I diastolic dysfunction 24/40/1027   LVH (left ventricular hypertrophy) 06/08/2022   Accelerated hypertension 06/08/2022   S/P cervical spinal fusion 08/18/2021   Former heavy tobacco smoker 08/18/2021   Gout 08/18/2021   Other chronic pain 25/36/6440   Systolic murmur 34/74/2595   Tobacco use 10/26/2020   Polyp of ascending colon    Rectal polyp    Class 2 severe obesity with serious  comorbidity and body mass index (BMI) of 39.0 to 39.9 in adult [facility]) 04/15/2019   Chronic kidney disease (CKD) stage G2/A3, mildly decreased glomerular filtration rate (GFR) between 60-89 mL/min/1.73 square meter and albuminuria creatinine ratio greater than 300 mg/g 04/15/2019   Proteinuria 04/15/2019   Hyperlipidemia associated with type 2 diabetes mellitus ([place]) 03/18/2019   Diabetes mellitus ([place]) 03/18/2019   Idiopathic chronic gout of multiple sites without tophus 03/18/2019   Essential hypertension 03/18/2019   Dermatitis 03/18/2019   Lumbar degenerative disc disease 11/22/2015   Cervical myelopathy ([place]) 11/22/2015   Osteoarthritis of spine with radiculopathy, lumbar region 11/22/2015    REFERRING DIAG: s/p cervical spine fusion, cervical myelopathy  THERAPY DIAG:  Cervicalgia  Radiculopathy, cervical region  Muscle weakness (generalized)  Other muscle spasm  Abnormal posture  Rationale for Evaluation and Treatment: Rehabilitation  PERTINENT HISTORY: Patient is a 56 y.o. male who presents to outpatient physical therapy with a referral for medical diagnosis cervical spine fusion, cervical myelopathy. This patient's chief complaints consist of tightness, pain, restricted ROM at the cervical spine and back of head and B UE pain/paresthesia leading to the following functional deficits: difficulty with physical activities, prolonged sitting or standing, using B UE, picking things up, reaching, yardwork, using tractor, driving, riding in vehicle, turning his head, viewing his surroundings, traveling, rolling over at night, sleeping on his  side, dressing, ADLs, IADLs. Relevant past medical history and comorbidities include cervical myelopathy leading to C2-6 laminectomy and C2-T2 fusion performed 02/01/2021, HTN, DMII, hyperlipidemia, lumbar degenerative disc disease, current smoker, obesity, systolic murmur, chronic kidney disease, cervical myelopathy, gout, diverticulitis. Patient does  not read/write well and needs assistance with completing paperwork.  PRECAUTIONS: don't lift over a certain amount of weight (unsure but thinks maybe 10#?), bending over, picking heavy stuff up (he does not know if/when these precautions expire. He was told not to bend over or pick up anything heavy.   SUBJECTIVE: Patient reports he saw his PCP who addressed his hypertension and sent him to the hospital for more cardiac testing which came back okay. He started taking a new BP medication on Monday and he feels better. He states his headache is gone and he feels like everything is brighter. He states he has not been doing his HEP much except the towel stretches. He has been walking a little but was less active when he knew his BP was elevated. He states he thinks the pain in his R little finger is from the rain yesterday. He states he feels comfortable with HEP and understands today is his last PT session due to insurance limitations.   PAIN:  Are you having pain? Stiffness in neck. Paresthesia in right hand digits 1-5, pain 7/10 in digit 5. OBJECTIVE  SELF-REPORTED FUNCTION FOTO score: 40/100 (neck questionnaire)  SPINE MOTION Cervical Spine AROM (measured after manual) *Indicates pain Flexion: 24 Extension: 4 Side Flexion:        R 5       L13 Rotation   R 34 L 44 Uncomfortable tightness noted while performing all motions.   Vitals:   06/13/22 0930  BP: (!) 164/87  Pulse: 75  SpO2: 95%    TODAY'S TREATMENT:  Therapeutic exercise: to centralize symptoms and improve ROM, strength, muscular endurance, and activity tolerance required for successful completion of functional activities.  - vitals check - see above - NuStep level 5 using bilateral upper and lower extremities. Seat/handle setting 8/8. For improved extremity mobility, muscular endurance, and activity tolerance; and to induce the analgesic effect of aerobic exercise, stimulate improved joint nutrition, and prepare body  structures and systems for following interventions. x 5 minutes. Average SPM = 95. - seated shoulder shrugs, 3x10 with 7# DB in each hand - seated row at [facility] machine, 3x10 at 35# - seated chest press at [place], 2x10 at 15# - measurements to assess progress (see above)   Manual therapy: to reduce pain and tissue tension, improve range of motion, neuromodulation, in order to promote improved ability to complete functional activities. SUPINE - STM to posterior cervical spine musculature focusing on B suboccipitals, cervical parapsinals, and UT/LS.  - PROM rotation with overpressure 1x10 each side with 10 second holds.   Pt required multimodal cuing for proper technique and to facilitate improved neuromuscular control, strength, range of motion, and functional ability resulting in improved performance and form.     HOME EXERCISE PROGRAM: Access Code: [email or web address] URL: [email or web address] Date: 06/13/2022 Prepared by: Rosita Kea  Exercises - Seated Assisted Cervical Rotation with Towel  - 2 x daily - 2 sets - 10 reps - 5 seconds hold - Standing Median Nerve Glide  - 2 x daily - 1 sets - 15 reps - Standing Shoulder Shrugs with Dumbbells  - 1 x daily - 3 sets - 10 reps - Row with band/cable  - 1 x daily - 3 sets -  10 reps - 2 seconds hold   PATIENT EDUCATION: Education details: discharge recommendations, POC, HEP  Person educated: Patient Education method: Customer service manager, handout Education comprehension: verbalized understanding and needs further education    PT Short Term Goals       PT SHORT TERM GOAL #1   Title Be independent with initial home exercise program for self-management of symptoms.    Baseline Initial HEP to be provided visit 2 as appropriate (12/19/2021);    Time 2    Period Weeks    Status Achieved    Target Date 01/02/22              PT Long Term Goals - 12/19/21 1112   TARGET DATE FOR ALL LONG TERM GOALS: 03/13/2022.  UPDATED TO TARGET DATE OF 04/24/2022 FOR ALL UNMET GOALS (01/30/2022); UPDATED TO TARGET DATE OF 06/20/2022 FOR ALL UNMET GOALS (04/18/2022);    PT LONG TERM GOAL #1   Title Be independent with a long-term home exercise program for self-management of symptoms.    Baseline Initial HEP to be provided visit 2 as appropriate (12/19/2021); continues to participate well (01/30/2022); continues to participate well (03/14/2022); continues to participate well (04/18/2022); participates as able (06/13/2022);    Time 12    Period Weeks    Status Met      PT LONG TERM GOAL #2   Title Demonstrate improved FOTO score by 10 units to demonstrate improvement in overall condition and self-reported functional ability.    Baseline to be measured at visit 2 as appropriate, patient needs assistance (11/21/2021); 38 at visit#2 (12/26/2021); 37 at visit#5 (01/04/2022); 46 at visit#9 (01/30/22); 47 at visit # 15 (03/14/2022); 40 at visit # 25 (06/13/2022);    Time 12    Period Weeks    Status In-Progress     PT LONG TERM GOAL #3   Title Pt will increase UE strength of by at least 1/2 MMT grade in order to demonstrate improvement in strength and function.    Baseline ranges from 3/5 to 4+/5 - see objective exam (12/19/2021); significantly improved strength and firmness against resistance at shoulder and elbow - still some difficulty  holding and weakness in hands and forearm - see objective exam (01/30/22); all but two motions have improved at least 1/2 MMT grade - see objective (03/14/2022); met (04/18/2022);   Time 12    Period Weeks    Status Met 04/18/2022     PT LONG TERM GOAL #4   Title Improve cervical AROM rotation to equal or greater than 45 degrees each direction and cervical extension to 15 degrees to improve ability to check blind spot when driving, view surroundings, and allow patient to complete valued activities with less difficulty.    Baseline Extension: 0 Rotation: R 10,  L 25 (12/19/2021); Extension: 5, Rotation: R 24, L 25  (01/30/2022); Extension: 0, Rotation: R 28, L 32 (03/14/2022); Extension: 2, Rotation: R 30, L 32 (04/18/2022); Extension 4, Rotation: R 34, L 44 (06/13/2022);    Time 12    Period Weeks    Status Partially met     PT LONG TERM GOAL #5   Title Complete community, work and/or recreational activities without limitation due to current condition    Baseline Functional Limitations: difficulty with physical activities, prolonged sitting or standing, using B UE, picking things up, reaching, yardwork, using tractor, driving, riding in vehicle, turning his head, viewing his surroundings, traveling, rolling over at night, sleeping on his side, dressing, ADLs,  IADLs (12/19/2021); patient reports improvements in all activities but continues to have difficulties such as difficulty picking up heavy things such as the steel chairs on his front porch, and it is still difficult to sleep. He always gets awoken at 4am. He still has difficulty turning his head and viewing his surroundings and this makes it more difficult to drive. He still drops things. He cannot turn his head enough to look behind him. He has difficulty sleeping on his side due to numbness in his fingers and pain (01/30/2022); Moving, driving, lifting has gotten easier for him, he doesn't feel like he has to strain as much, sleeping has been better, can do more before he wears out, he still has difficulty with activities especially lifting, prolonged sitting and standing, viewing surroundings (03/14/2022); he states he can turn his head more, he feels most of these things are a little bit better but not back to normal, has trouble with lifting, feels he has better hand grip when performing HEP, difficulty with overhead activities (04/18/2022); continues to have difficulty seeing overhead and side to side, has difficulty with grip (06/13/2022);    Time 12    Period Weeks    Status Partially met             Plan    Clinical Impression Statement Patient has  attended 25 physical therapy sessions since starting current episode of care on 12/19/2021. He has made progress towards goals, improved UE strength, and reports decreased pain but continues to be limited in cervical spine motion and hand strength. He also has pain and stiffness at times. He appears to have reached a plateau in progress but does benefit from regular PT with manual interventions to help chronic pain and improve his functional ability. Patient would benefit from continued PT but has reached the visit limit covered by insurance. Patient is therefore discharged from PT at this time to a long term HEP.    Personal Factors and Comorbidities Comorbidity 3+;Past/Current Experience;Fitness;Time since onset of injury/illness/exacerbation;Education;Social Background    Comorbidities Relevant past medical history and comorbidities include cervical myelopathy leading to C2-6 laminectomy and  C2-T2 fusion performed 02/01/2021, HTN, DMII, hyperlipidemia, lumbar degenerative disc disease, current smoker, obesity, systolic murmur, chronic kidney disease, cervical myelopathy, gout, diverticulitis.    Examination-Activity Limitations Bathing;Hygiene/Grooming;Squat;Lift;Bed Mobility;Bend;Locomotion Level;Stand;Reach Overhead;Carry;Sit;Dressing;Sleep    Examination-Participation Restrictions Community Activity;Yard Work;Driving;Interpersonal Relationship    Stability/Clinical Decision Making Evolving/Moderate complexity    Rehab Potential Good    PT Frequency 1-2x / week    PT Duration 9 weeks    PT Treatment/Interventions ADLs/Self Care Home Management;Aquatic Therapy;Cryotherapy;Moist Heat;Electrical Stimulation;Neuromuscular re-education;Therapeutic exercise;Therapeutic activities;Balance training;Patient/family education;Manual techniques;Dry needling;Passive range of motion    PT Next Visit Plan Patient is now discharged from [place] Access Code: [email or web address]    Consulted and  Agree with Plan of Care Patient             Everlean Alstrom. Graylon Good, PT, DPT 06/13/22, 8:56 PM  [facility] [address] [place], [place] [address] P: [phone] I F: [phone]

## 2022-06-14 ENCOUNTER — Other Ambulatory Visit: Payer: Self-pay | Admitting: Family Medicine

## 2022-06-19 ENCOUNTER — Encounter: Payer: Medicaid Other | Admitting: Physical Therapy

## 2022-06-21 ENCOUNTER — Ambulatory Visit: Payer: Medicaid Other | Admitting: Family Medicine

## 2022-06-21 ENCOUNTER — Encounter: Payer: Self-pay | Admitting: Family Medicine

## 2022-06-21 VITALS — BP 180/92 | HR 90 | Temp 98.2°F | Resp 16 | Ht 67.0 in | Wt 241.0 lb

## 2022-06-21 DIAGNOSIS — L299 Pruritus, unspecified: Secondary | ICD-10-CM | POA: Diagnosis not present

## 2022-06-21 DIAGNOSIS — I10 Essential (primary) hypertension: Secondary | ICD-10-CM | POA: Diagnosis not present

## 2022-06-21 MED ORDER — HYDRALAZINE HCL 50 MG PO TABS: 50.0000 mg | ORAL_TABLET | Freq: Three times a day (TID) | ORAL | 1 refills | Status: DC

## 2022-06-21 MED ORDER — HYDROXYZINE PAMOATE 25 MG PO CAPS: 25.0000 mg | ORAL_CAPSULE | Freq: Three times a day (TID) | ORAL | 1 refills | Status: DC | PRN

## 2022-06-21 NOTE — Progress Notes (Signed)
Patient ID: Cory Rivas, male    DOB: 20-Feb-1966, 56 y.o.   MRN: [id number]  PCP: Delsa Grana, PA-C  Chief Complaint  Patient presents with   Rash    Itches onset for 3 weeks on both arms near antecubital space    Subjective:   Cory Rivas is a 56 y.o. male, presents to clinic with CC of the following:  HPI  Arm itching and burning since head injury, arms scratched up, he endorses burning, nerve pain, itching, feeling like things are getting tense or crawling He hasn't seen his neurologist/neurosurgeon, but sx have been ongoing since head injury/LOC with concussion - he has seen multiple specialsits for this over the past several weeks to months, done PT and seen concussion specialists and he recently got discharged from them all, did not talk to them about these sx, but they began at the time of head injury, and have not changed  HTN -  BP slightly improved with addition of hydralazine 25 mg TID, he's continued norvasc and lisinopril daily He did lasix x 7 d, stopped, did not have any weight changes or LE edema, he has no orthopnea, still some SOB and exertional SOB but it improves with inhaler HE does note improved sx to head and eyes, less pressure and pain with BP down some BP Readings from Last 10 Encounters:  06/21/22 (!) 174/82  06/13/22 (!) 164/87  06/08/22 (!) 170/76  05/31/22 (!) 200/98  05/22/22 (!) 188/95  05/08/22 (!) 180/100  04/01/22 123/69  03/29/22 (!) 164/96  03/26/22 (!) 164/86  03/06/22 (!) 168/112     No weight change Wt Readings from Last 5 Encounters:  06/21/22 241 lb (109.3 kg)  06/08/22 241 lb 3.2 oz (109.4 kg)  05/08/22 240 lb (108.9 kg)  04/01/22 240 lb 4.8 oz (109 kg)  03/29/22 242 lb 4.6 oz (109.9 kg)   BMI Readings from Last 5 Encounters:  06/21/22 37.75 kg/m  06/08/22 37.78 kg/m  05/08/22 36.49 kg/m  04/01/22 37.64 kg/m  03/29/22 37.95 kg/m     Patient Active Problem List   Diagnosis Date Noted   Grade I diastolic  dysfunction 64/40/3474   LVH (left ventricular hypertrophy) 06/08/2022   Accelerated hypertension 06/08/2022   S/P cervical spinal fusion 08/18/2021   Former heavy tobacco smoker 08/18/2021   Gout 08/18/2021   Other chronic pain 25/95/6387   Systolic murmur 56/43/3295   Tobacco use 10/26/2020   Polyp of ascending colon    Rectal polyp    Class 2 severe obesity with serious comorbidity and body mass index (BMI) of 39.0 to 39.9 in adult [facility] [address]) 04/15/2019   Chronic kidney disease (CKD) stage G2/A3, mildly decreased glomerular filtration rate (GFR) between 60-89 mL/min/1.73 square meter and albuminuria creatinine ratio greater than 300 mg/g 04/15/2019   Proteinuria 04/15/2019   Hyperlipidemia associated with type 2 diabetes mellitus ([place]) 03/18/2019   Diabetes mellitus ([place]) 03/18/2019   Idiopathic chronic gout of multiple sites without tophus 03/18/2019   Essential hypertension 03/18/2019   Dermatitis 03/18/2019   Lumbar degenerative disc disease 11/22/2015   Cervical myelopathy ([place]) 11/22/2015   Osteoarthritis of spine with radiculopathy, lumbar region 11/22/2015      Current Outpatient Medications:    acetaminophen (TYLENOL) 325 MG tablet, SMARTSIG:3 Tablet(s) By Mouth Every 8 Hours PRN, Disp: , Rfl:    allopurinol (ZYLOPRIM) 100 MG tablet, Take 1 tablet (100 mg total) by mouth daily. Take with 300 mg tab (for total of 400 mg once  daily), Disp: 90 tablet, Rfl: 3   allopurinol (ZYLOPRIM) 300 MG tablet, Take one 300 mg tab po daily WITH 100 mg tab for total of 400 mg once daily for gout, Disp: 90 tablet, Rfl: 3   amLODipine (NORVASC) 10 MG tablet, Take 1 tablet (10 mg total) by mouth daily., Disp: 90 tablet, Rfl: 3   Baclofen 5 MG TABS, Take 5-10 mg by mouth 3 (three) times daily as needed (msk pain or spasms)., Disp: 90 tablet, Rfl: 3   budesonide-formoterol (SYMBICORT) 80-4.5 MCG/ACT inhaler, Inhale 2 puffs into the lungs 2 (two) times daily. After use rinse out mouth - swish and spit,  Disp: 1 each, Rfl: 1   Continuous Blood Gluc Transmit (DEXCOM G6 TRANSMITTER) MISC, Use 1 each every 3 (three) months, Disp: , Rfl:    Dulaglutide 1.5 MG/0.5ML SOPN, Inject into the skin., Disp: , Rfl:    DULoxetine (CYMBALTA) 30 MG capsule, Take 1 capsule (30 mg total) by mouth 2 (two) times daily., Disp: 180 capsule, Rfl: 1   ezetimibe (ZETIA) 10 MG tablet, Take 1 tablet (10 mg total) by mouth daily., Disp: 90 tablet, Rfl: 3   hydrALAZINE (APRESOLINE) 25 MG tablet, Take 1 tablet (25 mg total) by mouth 3 (three) times daily. Monitor BP, Disp: 90 tablet, Rfl: 0   hydrOXYzine (VISTARIL) 25 MG capsule, Take 1 capsule (25 mg total) by mouth every 8 (eight) hours as needed for itching., Disp: 30 capsule, Rfl: 1   ibuprofen (ADVIL) 800 MG tablet, Take 800 mg by mouth every 6 (six) hours as needed., Disp: , Rfl:    indomethacin (INDOCIN) 25 MG capsule, TAKE 2 TABLETS (50 MG) BY 3 TIMES DAILY FOR 2 DAYS AT ONSET OF GOUT FLARE UP, THEN TAKE 1('25MG'$ )TABLET BY MOUTH FOR 3 DAYS, Disp: 20 capsule, Rfl: 3   insulin lispro (HUMALOG) 100 UNIT/ML KwikPen, By correction scale based on pre-meal blood sugar. 2 units/ 50 over 150. TDD up to 45 units, Disp: , Rfl:    levocetirizine (XYZAL) 5 MG tablet, Take 1 tablet (5 mg total) by mouth every evening., Disp: 90 tablet, Rfl: 1   lisinopril (ZESTRIL) 40 MG tablet, Take 1 tablet (40 mg total) by mouth daily., Disp: 90 tablet, Rfl: 3   metFORMIN (GLUCOPHAGE) 1000 MG tablet, TAKE 1 TABLET BY MOUTH TWICE A DAY WITH A MEAL, Disp: 180 tablet, Rfl: 3   mometasone (NASONEX) 50 MCG/ACT nasal spray, Place 2 sprays into the nose daily., Disp: 1 each, Rfl: 12   Olopatadine HCl 0.2 % SOLN, Apply 1 drop to eye 2 (two) times daily as needed (eye allergies)., Disp: 2.5 mL, Rfl: 3   potassium chloride SA (KLOR-CON M) 20 MEQ tablet, Take 1 tablet (20 mEq total) by mouth daily as needed. Take when taking lasix, Disp: 30 tablet, Rfl: 0   rosuvastatin (CRESTOR) 40 MG tablet, Take 1 tablet (40  mg total) by mouth daily., Disp: 90 tablet, Rfl: 3   triamcinolone ointment (KENALOG) 0.5 %, Apply 1 application. topically 2 (two) times daily., Disp: 30 g, Rfl: 0   TRULICITY 3 VQ/0.0QQ SOPN, Inject into the skin., Disp: , Rfl:    VENTOLIN HFA 108 (90 Base) MCG/ACT inhaler, INHALE 2 PUFFS INTO THE LUNGS EVERY 6 HOURS AS NEEDED FOR WHEEZING OR SHORTNESS OF BREATH, Disp: 18 g, Rfl: 2   furosemide (LASIX) 20 MG tablet, Take 1 tablet (20 mg total) by mouth daily for 7 days., Disp: 7 tablet, Rfl: 0   insulin glargine (LANTUS SOLOSTAR) 100  UNIT/ML Solostar Pen, Inject into the skin., Disp: , Rfl:    Allergies  Allergen Reactions   Oxycodone Nausea And Vomiting     Social History   Tobacco Use   Smoking status: Every Day    Packs/day: 1.00    Years: 37.00    Total pack years: 37.00    Types: Cigarettes    Start date: 10/19/1989   Smokeless tobacco: Never   Tobacco comments:    30+ years hx smoking 2 ppd, stopped smoking briefly in April 2022 for a surgery and then restarted smoking about 1/2 ppd last 4-5 months   Vaping Use   Vaping Use: Never used  Substance Use Topics   Alcohol use: No   Drug use: No      Chart Review Today: I personally reviewed active problem list, medication list, allergies, family history, social history, health maintenance, notes from last encounter, lab results, imaging with the patient/caregiver today.   Review of Systems  Constitutional: Negative.   HENT: Negative.    Eyes: Negative.   Respiratory: Negative.    Cardiovascular: Negative.   Gastrointestinal: Negative.   Endocrine: Negative.   Genitourinary: Negative.   Musculoskeletal: Negative.   Skin: Negative.   Allergic/Immunologic: Negative.   Neurological: Negative.   Hematological: Negative.   Psychiatric/Behavioral: Negative.    All other systems reviewed and are negative.      Objective:   Vitals:   06/21/22 1511  BP: (!) 174/82  Pulse: 90  Resp: 16  Temp: 98.2 F (36.8 C)   TempSrc: Oral  SpO2: 97%  Weight: 241 lb (109.3 kg)  Height: '5\' 7"'$  (1.702 m)    Body mass index is 37.75 kg/m.  Physical Exam Vitals and nursing note reviewed.  Constitutional:      General: He is not in acute distress.    Appearance: Normal appearance. He is obese. He is not toxic-appearing.  HENT:     Head: Normocephalic and atraumatic.  Cardiovascular:     Rate and Rhythm: Normal rate and regular rhythm.     Pulses: Normal pulses.     Heart sounds: Murmur heard.     No friction rub. No gallop.  Pulmonary:     Effort: Pulmonary effort is normal. No respiratory distress.     Breath sounds: Normal breath sounds. No stridor. No wheezing, rhonchi or rales.  Musculoskeletal:     Cervical back: Rigidity present.     Right lower leg: No edema.     Left lower leg: No edema.  Skin:    General: Skin is warm.     Comments: Scattered excoriations and scabs to b/l forearm, no rash visualized  Neurological:     Mental Status: He is alert. Mental status is at baseline.     Gait: Gait normal.  Psychiatric:        Mood and Affect: Mood normal.      Results for orders placed or performed during the hospital encounter of 06/08/22  Comprehensive metabolic panel  Result Value Ref Range   Sodium 139 135 - 145 mmol/L   Potassium 3.9 3.5 - 5.1 mmol/L   Chloride 106 98 - 111 mmol/L   CO2 27 22 - 32 mmol/L   Glucose, Bld 215 (H) 70 - 99 mg/dL   BUN 18 6 - 20 mg/dL   Creatinine, Ser 1.29 (H) 0.61 - 1.24 mg/dL   Calcium 8.7 (L) 8.9 - 10.3 mg/dL   Total Protein 7.3 6.5 - 8.1 g/dL   Albumin  3.8 3.5 - 5.0 g/dL   AST 19 15 - 41 U/L   ALT 13 0 - 44 U/L   Alkaline Phosphatase 63 38 - 126 U/L   Total Bilirubin 0.8 0.3 - 1.2 mg/dL   GFR, Estimated >60 >60 mL/min   Anion gap 6 5 - 15  Lipid panel  Result Value Ref Range   Cholesterol 224 (H) 0 - 200 mg/dL   Triglycerides 266 (H) <150 mg/dL   HDL 36 (L) >40 mg/dL   Total CHOL/HDL Ratio 6.2 RATIO   VLDL 53 (H) 0 - 40 mg/dL   LDL  Cholesterol 135 (H) 0 - 99 mg/dL  CBC with Differential/Platelet  Result Value Ref Range   WBC 9.7 4.0 - 10.5 K/uL   RBC 5.22 4.22 - 5.81 MIL/uL   Hemoglobin 14.5 13.0 - 17.0 g/dL   HCT 43.7 39.0 - 52.0 %   MCV 83.7 80.0 - 100.0 fL   MCH 27.8 26.0 - 34.0 pg   MCHC 33.2 30.0 - 36.0 g/dL   RDW 13.2 11.5 - 15.5 %   Platelets 217 150 - 400 K/uL   nRBC 0.0 0.0 - 0.2 %   Neutrophils Relative % 74 %   Neutro Abs 7.2 1.7 - 7.7 K/uL   Lymphocytes Relative 18 %   Lymphs Abs 1.7 0.7 - 4.0 K/uL   Monocytes Relative 6 %   Monocytes Absolute 0.6 0.1 - 1.0 K/uL   Eosinophils Relative 2 %   Eosinophils Absolute 0.2 0.0 - 0.5 K/uL   Basophils Relative 0 %   Basophils Absolute 0.0 0.0 - 0.1 K/uL   Immature Granulocytes 0 %   Abs Immature Granulocytes 0.02 0.00 - 0.07 K/uL  Brain natriuretic peptide  Result Value Ref Range   B Natriuretic Peptide 161.5 (H) 0.0 - 100.0 pg/mL       Assessment & Plan:   1. Pruritus 6+ weeks of sx since ED visit with head injury LOC/concussion, he endorses neuropathy sx, seems to radiate from neck, sx unchanged since onset, no known exposure to poison oak or ivy, but itch is similar to this, no visible rash, only excoriations Encouraged OTC zyrtec/claritin, hydroxyzine as needed for itch - hydrOXYzine (VISTARIL) 25 MG capsule; Take 1 capsule (25 mg total) by mouth every 8 (eight) hours as needed for itching.  Dispense: 30 capsule; Refill: 1  Encouraged him to f/up with neck/neuro specialists since it seems very associated with neuropathy/cervical radiculopathy etc and they manage that and specialists would be needed to eval  If rash develops or sx do not improve we can try and longer steroid taper, but I was hoping to avoid it for now with his uncontrolled DM and HTN   2. Accelerated hypertension BP still elevated, though some slightly better readings recorded since last OV He did start hydralazine and he feels several sx improve, unfortunately BP is still high  here in office BP Readings from Last 3 Encounters:  06/21/22 (!) 174/82  06/13/22 (!) 164/87  06/08/22 (!) 170/76   Increase to 50 mg TID, he has cardiology f/up soon We will need to do a 2 week f/up OV since BP is not improved much - hydrALAZINE (APRESOLINE) 50 MG tablet; Take 1 tablet (50 mg total) by mouth 3 (three) times daily. Monitor BP  Dispense: 90 tablet; Refill: 1  Today pt endorses improved HA and eye pain/pressure, no CP, SOB, LE edema, palpations, orthopnea. His weight is stable. He should still proceed with cardiology eval  Pt will call us if  hydroxyzine TID PRN and f/up with specialists about neuropathy If not better we can try a steroid taper - buit currently no visible rash, only excoriations, no recent exposure to poison oak/ivy  Delsa Grana, PA-C 06/21/22 3:32 PM

## 2022-06-21 NOTE — Patient Instructions (Addendum)
08/07/2022 9:00 AM Agbor-Etang, Cory Edelman, MD [facility]   Make sure to keep your appointment with cardiology  I need to see you back in the office in 2 weeks to recheck your BP

## 2022-06-26 ENCOUNTER — Encounter: Payer: Medicaid Other | Admitting: Physical Therapy

## 2022-06-27 ENCOUNTER — Encounter: Payer: Self-pay | Admitting: Family Medicine

## 2022-06-27 ENCOUNTER — Ambulatory Visit: Payer: Medicaid Other | Admitting: Family Medicine

## 2022-06-27 VITALS — BP 160/80 | HR 80 | Temp 98.3°F | Resp 16 | Ht 67.0 in | Wt 240.3 lb

## 2022-06-27 DIAGNOSIS — R21 Rash and other nonspecific skin eruption: Secondary | ICD-10-CM

## 2022-06-27 DIAGNOSIS — E1169 Type 2 diabetes mellitus with other specified complication: Secondary | ICD-10-CM | POA: Diagnosis not present

## 2022-06-27 DIAGNOSIS — R233 Spontaneous ecchymoses: Secondary | ICD-10-CM

## 2022-06-27 DIAGNOSIS — R631 Polydipsia: Secondary | ICD-10-CM

## 2022-06-27 DIAGNOSIS — R6883 Chills (without fever): Secondary | ICD-10-CM

## 2022-06-27 DIAGNOSIS — I10 Essential (primary) hypertension: Secondary | ICD-10-CM

## 2022-06-27 DIAGNOSIS — M109 Gout, unspecified: Secondary | ICD-10-CM | POA: Diagnosis not present

## 2022-06-27 LAB — POCT URINALYSIS DIPSTICK
Bilirubin, UA: NEGATIVE
Blood, UA: POSITIVE
Glucose, UA: POSITIVE — AB
Ketones, UA: NEGATIVE
Leukocytes, UA: NEGATIVE
Nitrite, UA: NEGATIVE
Protein, UA: POSITIVE — AB
Spec Grav, UA: 1.025 (ref 1.010–1.025)
Urobilinogen, UA: 0.2 E.U./dL
pH, UA: 5 (ref 5.0–8.0)

## 2022-06-27 NOTE — Progress Notes (Signed)
Patient ID: Cory Rivas, male    DOB: 17-Jan-1966, 56 y.o.   MRN: [id number]  PCP: Delsa Grana, PA-C  Chief Complaint  Patient presents with   Rash    Both legs not itchy just visible    Subjective:   Cory Rivas is a 56 y.o. male, presents to clinic with CC of the following:  HPI   Rash to b/l LE noted on Sunday, red spots concerntrated to ankles legs and scattered other places on body stomach, arms face Not on feet No itching Not raised - flat He felt ill Saturday with a chill with no other sx Rash seems to be improving - decreasing in size and distribution No one else at home with similar sx No LE edema, new soaps, lotions, detergents, medications   BP still elevated - pt endorses extreme anxiety coming into the office for a visit, he has a home BP cuff but hasn't used it recently to monitor at home, cannot find where it went  BP Readings from Last 3 Encounters:  06/27/22 (!) 160/80  06/21/22 (!) 180/92  06/13/22 (!) 164/87   HTN still uncontrolled taking hydralazine 50 mg TID Wt Readings from Last 5 Encounters:  06/27/22 240 lb 4.8 oz (109 kg)  06/21/22 241 lb (109.3 kg)  06/08/22 241 lb 3.2 oz (109.4 kg)  05/08/22 240 lb (108.9 kg)  04/01/22 240 lb 4.8 oz (109 kg)   BMI Readings from Last 5 Encounters:  06/27/22 37.64 kg/m  06/21/22 37.75 kg/m  06/08/22 37.78 kg/m  05/08/22 36.49 kg/m  04/01/22 37.64 kg/m      Patient Active Problem List   Diagnosis Date Noted   Grade I diastolic dysfunction 46/96/2952   LVH (left ventricular hypertrophy) 06/08/2022   Accelerated hypertension 06/08/2022   S/P cervical spinal fusion 08/18/2021   Former heavy tobacco smoker 08/18/2021   Gout 08/18/2021   Other chronic pain 84/13/2440   Systolic murmur 08/11/2535   Tobacco use 10/26/2020   Polyp of ascending colon    Rectal polyp    Class 2 severe obesity with serious comorbidity and body mass index (BMI) of 39.0 to 39.9 in adult [facility]) 04/15/2019    Chronic kidney disease (CKD) stage G2/A3, mildly decreased glomerular filtration rate (GFR) between 60-89 mL/min/1.73 square meter and albuminuria creatinine ratio greater than 300 mg/g 04/15/2019   Proteinuria 04/15/2019   Hyperlipidemia associated with type 2 diabetes mellitus ([place]) 03/18/2019   Diabetes mellitus ([place]) 03/18/2019   Idiopathic chronic gout of multiple sites without tophus 03/18/2019   Essential hypertension 03/18/2019   Dermatitis 03/18/2019   Lumbar degenerative disc disease 11/22/2015   Cervical myelopathy ([place]) 11/22/2015   Osteoarthritis of spine with radiculopathy, lumbar region 11/22/2015      Current Outpatient Medications:    acetaminophen (TYLENOL) 325 MG tablet, SMARTSIG:3 Tablet(s) By Mouth Every 8 Hours PRN, Disp: , Rfl:    allopurinol (ZYLOPRIM) 100 MG tablet, Take 1 tablet (100 mg total) by mouth daily. Take with 300 mg tab (for total of 400 mg once daily), Disp: 90 tablet, Rfl: 3   allopurinol (ZYLOPRIM) 300 MG tablet, Take one 300 mg tab po daily WITH 100 mg tab for total of 400 mg once daily for gout, Disp: 90 tablet, Rfl: 3   amLODipine (NORVASC) 10 MG tablet, Take 1 tablet (10 mg total) by mouth daily., Disp: 90 tablet, Rfl: 3   Baclofen 5 MG TABS, Take 5-10 mg by mouth 3 (three) times daily as needed (msk  pain or spasms)., Disp: 90 tablet, Rfl: 3   budesonide-formoterol (SYMBICORT) 80-4.5 MCG/ACT inhaler, Inhale 2 puffs into the lungs 2 (two) times daily. After use rinse out mouth - swish and spit, Disp: 1 each, Rfl: 1   Continuous Blood Gluc Transmit (DEXCOM G6 TRANSMITTER) MISC, Use 1 each every 3 (three) months, Disp: , Rfl:    Dulaglutide 1.5 MG/0.5ML SOPN, Inject into the skin., Disp: , Rfl:    DULoxetine (CYMBALTA) 30 MG capsule, Take 1 capsule (30 mg total) by mouth 2 (two) times daily., Disp: 180 capsule, Rfl: 1   ezetimibe (ZETIA) 10 MG tablet, Take 1 tablet (10 mg total) by mouth daily., Disp: 90 tablet, Rfl: 3   hydrALAZINE (APRESOLINE) 50 MG  tablet, Take 1 tablet (50 mg total) by mouth 3 (three) times daily. Monitor BP, Disp: 90 tablet, Rfl: 1   hydrOXYzine (VISTARIL) 25 MG capsule, Take 1 capsule (25 mg total) by mouth every 8 (eight) hours as needed for itching., Disp: 30 capsule, Rfl: 1   ibuprofen (ADVIL) 800 MG tablet, Take 800 mg by mouth every 6 (six) hours as needed., Disp: , Rfl:    indomethacin (INDOCIN) 25 MG capsule, TAKE 2 TABLETS (50 MG) BY 3 TIMES DAILY FOR 2 DAYS AT ONSET OF GOUT FLARE UP, THEN TAKE 1('25MG'$ )TABLET BY MOUTH FOR 3 DAYS, Disp: 20 capsule, Rfl: 3   insulin lispro (HUMALOG) 100 UNIT/ML KwikPen, By correction scale based on pre-meal blood sugar. 2 units/ 50 over 150. TDD up to 45 units, Disp: , Rfl:    levocetirizine (XYZAL) 5 MG tablet, Take 1 tablet (5 mg total) by mouth every evening., Disp: 90 tablet, Rfl: 1   lisinopril (ZESTRIL) 40 MG tablet, Take 1 tablet (40 mg total) by mouth daily., Disp: 90 tablet, Rfl: 3   metFORMIN (GLUCOPHAGE) 1000 MG tablet, TAKE 1 TABLET BY MOUTH TWICE A DAY WITH A MEAL, Disp: 180 tablet, Rfl: 3   mometasone (NASONEX) 50 MCG/ACT nasal spray, Place 2 sprays into the nose daily., Disp: 1 each, Rfl: 12   Olopatadine HCl 0.2 % SOLN, Apply 1 drop to eye 2 (two) times daily as needed (eye allergies)., Disp: 2.5 mL, Rfl: 3   potassium chloride SA (KLOR-CON M) 20 MEQ tablet, Take 1 tablet (20 mEq total) by mouth daily as needed. Take when taking lasix, Disp: 30 tablet, Rfl: 0   rosuvastatin (CRESTOR) 40 MG tablet, Take 1 tablet (40 mg total) by mouth daily., Disp: 90 tablet, Rfl: 3   triamcinolone ointment (KENALOG) 0.5 %, Apply 1 application. topically 2 (two) times daily., Disp: 30 g, Rfl: 0   TRULICITY 3 QZ/0.0PQ SOPN, Inject into the skin., Disp: , Rfl:    VENTOLIN HFA 108 (90 Base) MCG/ACT inhaler, INHALE 2 PUFFS INTO THE LUNGS EVERY 6 HOURS AS NEEDED FOR WHEEZING OR SHORTNESS OF BREATH, Disp: 18 g, Rfl: 2   furosemide (LASIX) 20 MG tablet, Take 1 tablet (20 mg total) by mouth daily  for 7 days., Disp: 7 tablet, Rfl: 0   insulin glargine (LANTUS SOLOSTAR) 100 UNIT/ML Solostar Pen, Inject into the skin., Disp: , Rfl:    Allergies  Allergen Reactions   Oxycodone Nausea And Vomiting     Social History   Tobacco Use   Smoking status: Every Day    Packs/day: 1.00    Years: 37.00    Total pack years: 37.00    Types: Cigarettes    Start date: 10/19/1989   Smokeless tobacco: Never   Tobacco comments:  30+ years hx smoking 2 ppd, stopped smoking briefly in April 2022 for a surgery and then restarted smoking about 1/2 ppd last 4-5 months   Vaping Use   Vaping Use: Never used  Substance Use Topics   Alcohol use: No   Drug use: No      Chart Review Today: I personally reviewed active problem list, medication list, allergies, family history, social history, health maintenance, notes from last encounter, lab results, imaging with the patient/caregiver today.   Review of Systems  Constitutional: Negative.   HENT: Negative.    Eyes: Negative.   Respiratory: Negative.    Cardiovascular: Negative.   Gastrointestinal: Negative.   Endocrine: Negative.   Genitourinary: Negative.   Musculoskeletal: Negative.   Skin: Negative.   Allergic/Immunologic: Negative.   Neurological: Negative.   Hematological: Negative.   Psychiatric/Behavioral: Negative.    All other systems reviewed and are negative.      Objective:   Vitals:   06/27/22 1431 06/27/22 1448  BP: (!) 174/86 (!) 160/80  Pulse: 80   Resp: 16   Temp: 98.3 F (36.8 C)   TempSrc: Oral   SpO2: 97%   Weight: 240 lb 4.8 oz (109 kg)   Height: '5\' 7"'$  (1.702 m)     Body mass index is 37.64 kg/m.  Physical Exam Vitals and nursing note reviewed.  Constitutional:      General: He is not in acute distress.    Appearance: He is obese. He is not ill-appearing, toxic-appearing or diaphoretic.  HENT:     Head: Normocephalic.     Mouth/Throat:     Mouth: Mucous membranes are moist.     Pharynx:  Oropharynx is clear. No oropharyngeal exudate or posterior oropharyngeal erythema.  Cardiovascular:     Rate and Rhythm: Normal rate and regular rhythm.     Pulses: Normal pulses.     Heart sounds: Normal heart sounds.     No friction rub. No gallop.     Comments: 1+ ankle pitting edema, no pretibial edema Pulmonary:     Effort: Pulmonary effort is normal. No respiratory distress.     Breath sounds: Normal breath sounds.  Abdominal:     General: Bowel sounds are normal.     Palpations: Abdomen is soft.  Skin:    Findings: Rash (nonblanching erythematous areas scattered to LE and few spots to abd, arms, face (very scant) most 1-48m, none below ankle or on feet or souls) present.  Neurological:     Mental Status: He is alert. Mental status is at baseline.  Psychiatric:        Mood and Affect: Affect normal. Mood is anxious.        Speech: Speech normal.        Behavior: Behavior normal. Behavior is cooperative.      Results for orders placed or performed during the hospital encounter of 06/08/22  Comprehensive metabolic panel  Result Value Ref Range   Sodium 139 135 - 145 mmol/L   Potassium 3.9 3.5 - 5.1 mmol/L   Chloride 106 98 - 111 mmol/L   CO2 27 22 - 32 mmol/L   Glucose, Bld 215 (H) 70 - 99 mg/dL   BUN 18 6 - 20 mg/dL   Creatinine, Ser 1.29 (H) 0.61 - 1.24 mg/dL   Calcium 8.7 (L) 8.9 - 10.3 mg/dL   Total Protein 7.3 6.5 - 8.1 g/dL   Albumin 3.8 3.5 - 5.0 g/dL   AST 19 15 - 41 U/L  ALT 13 0 - 44 U/L   Alkaline Phosphatase 63 38 - 126 U/L   Total Bilirubin 0.8 0.3 - 1.2 mg/dL   GFR, Estimated >60 >60 mL/min   Anion gap 6 5 - 15  Lipid panel  Result Value Ref Range   Cholesterol 224 (H) 0 - 200 mg/dL   Triglycerides 266 (H) <150 mg/dL   HDL 36 (L) >40 mg/dL   Total CHOL/HDL Ratio 6.2 RATIO   VLDL 53 (H) 0 - 40 mg/dL   LDL Cholesterol 135 (H) 0 - 99 mg/dL  CBC with Differential/Platelet  Result Value Ref Range   WBC 9.7 4.0 - 10.5 K/uL   RBC 5.22 4.22 - 5.81  MIL/uL   Hemoglobin 14.5 13.0 - 17.0 g/dL   HCT 43.7 39.0 - 52.0 %   MCV 83.7 80.0 - 100.0 fL   MCH 27.8 26.0 - 34.0 pg   MCHC 33.2 30.0 - 36.0 g/dL   RDW 13.2 11.5 - 15.5 %   Platelets 217 150 - 400 K/uL   nRBC 0.0 0.0 - 0.2 %   Neutrophils Relative % 74 %   Neutro Abs 7.2 1.7 - 7.7 K/uL   Lymphocytes Relative 18 %   Lymphs Abs 1.7 0.7 - 4.0 K/uL   Monocytes Relative 6 %   Monocytes Absolute 0.6 0.1 - 1.0 K/uL   Eosinophils Relative 2 %   Eosinophils Absolute 0.2 0.0 - 0.5 K/uL   Basophils Relative 0 %   Basophils Absolute 0.0 0.0 - 0.1 K/uL   Immature Granulocytes 0 %   Abs Immature Granulocytes 0.02 0.00 - 0.07 K/uL  Brain natriuretic peptide  Result Value Ref Range   B Natriuretic Peptide 161.5 (H) 0.0 - 100.0 pg/mL       Assessment & Plan:   1. Rash and nonspecific skin eruption Patient presents for rash onset about 4 days ago, more concentrated on his legs at onset and has gradually improved Appears to be purpura or petechiae that is nonblanching, reassuring that rash is gradually improving on its own he has had no other signs of bleeding, some vague chills but no other viral symptoms no recent strep throat or fever. He did have a slight gout flare but that was after his he already had his rash Will check platelets, screen urine for hematuria - CBC with Differential/Platelet  2. Chills Vague, onset Saturday about 4 days ago, brief with no other associated symptoms - CBC with Differential/Platelet - COMPLETE METABOLIC PANEL WITH GFR  3. Acute gout of multiple sites, unspecified cause Mild flare about 2 days ago he took indomethacin 2 doses and his symptoms improved - COMPLETE METABOLIC PANEL WITH GFR  4. Petechiae More concentrated to lower extremities, but sparing below ankle and only few scattered spots to trunk or upper extremities Improving over the past couple days - CBC with Differential/Platelet - POCT urinalysis dipstick  5. Excessive thirst Patient  endorses well-controlled blood sugars but he is not actually following endocrinology instructions, his last A1c was well controlled but we have had multiple labs showing hyperglycemia 200+ He declines increased urine output with his new increased thirst, we will screen his urine for proteinuria, check labs for hyperglycemia Unclear etiology, he mentioned when leaving the clinic today, he may need to follow-up with endocrinology or we may need to get a nephrology consult?  - COMPLETE METABOLIC PANEL WITH GFR - POCT urinalysis dipstick - Microalbumin / creatinine urine ratio  6. Type 2 diabetes mellitus with other specified  complication, without long-term current use of insulin ([place]) Per [facility], their last office visits and labs were reviewed - Microalbumin / creatinine urine ratio  7.  Accelerated HTN Slight improvement in his BP - he endorses being very anxious to come to the dr office -he has increased hydralazine from 25 mg 3 times daily to 50 mg 3 times daily, encouraged him to find his blood pressure cuff at home and monitor at home when relaxed He currently does not have any chest pain, palpitations, he only has scant lower extremity edema with no weight changes and no orthopnea his dyspnea on exertion has improved a little bit may have had a larger respiratory component and I initially thought -but overall his concerning symptoms are gradually improving with improving blood pressure encouraged him to still follow-up with cardiology which she has scheduled in about a week or 2  See #5 At end of visit pt mentions excessive thirst for several days without matching increased urine output He denies high blood sugars - 110-130's he states, but he is not wearing dexcom and isn't checking sugars regularly he does manage his insulin-dependent diabetes at [facility] his last office visit was about 6 months ago and at that time his A1c was well controlled at 6.5, I do not see  Jardiance or Farxiga on the chart he has managed on basal insulin, mealtime insulin, metformin and Trulicity He is not losing any weight, he denies any dysuria, hematuria, flank pain, abdominal pain  Delsa Grana, PA-C 06/27/22 2:58 PM

## 2022-06-28 LAB — CBC WITH DIFFERENTIAL/PLATELET
Absolute Monocytes: 585 cells/uL (ref 200–950)
Basophils Absolute: 34 cells/uL (ref 0–200)
Basophils Relative: 0.4 %
Eosinophils Absolute: 241 cells/uL (ref 15–500)
Eosinophils Relative: 2.8 %
HCT: 44.1 % (ref 38.5–50.0)
Hemoglobin: 15 g/dL (ref 13.2–17.1)
Lymphs Abs: 2623 cells/uL (ref 850–3900)
MCH: 28.6 pg (ref 27.0–33.0)
MCHC: 34 g/dL (ref 32.0–36.0)
MCV: 84.2 fL (ref 80.0–100.0)
MPV: 10.5 fL (ref 7.5–12.5)
Monocytes Relative: 6.8 %
Neutro Abs: 5117 cells/uL (ref 1500–7800)
Neutrophils Relative %: 59.5 %
Platelets: 212 10*3/uL (ref 140–400)
RBC: 5.24 10*6/uL (ref 4.20–5.80)
RDW: 13.6 % (ref 11.0–15.0)
Total Lymphocyte: 30.5 %
WBC: 8.6 10*3/uL (ref 3.8–10.8)

## 2022-06-28 LAB — COMPLETE METABOLIC PANEL WITH GFR
AG Ratio: 1.3 (calc) (ref 1.0–2.5)
ALT: 16 U/L (ref 9–46)
AST: 15 U/L (ref 10–35)
Albumin: 4 g/dL (ref 3.6–5.1)
Alkaline phosphatase (APISO): 72 U/L (ref 35–144)
BUN/Creatinine Ratio: 10 (calc) (ref 6–22)
BUN: 16 mg/dL (ref 7–25)
CO2: 26 mmol/L (ref 20–32)
Calcium: 8.9 mg/dL (ref 8.6–10.3)
Chloride: 102 mmol/L (ref 98–110)
Creat: 1.57 mg/dL — ABNORMAL HIGH (ref 0.70–1.30)
Globulin: 3 g/dL (calc) (ref 1.9–3.7)
Glucose, Bld: 224 mg/dL — ABNORMAL HIGH (ref 65–99)
Potassium: 4.3 mmol/L (ref 3.5–5.3)
Sodium: 139 mmol/L (ref 135–146)
Total Bilirubin: 0.5 mg/dL (ref 0.2–1.2)
Total Protein: 7 g/dL (ref 6.1–8.1)
eGFR: 52 mL/min/{1.73_m2} — ABNORMAL LOW (ref >=60)

## 2022-06-28 LAB — MICROALBUMIN / CREATININE URINE RATIO
Creatinine, Urine: 159 mg/dL (ref 20–320)
Microalb Creat Ratio: 1036 mcg/mg creat — ABNORMAL HIGH (ref <30)
Microalb, Ur: 164.7 mg/dL

## 2022-06-30 ENCOUNTER — Emergency Department: Payer: Medicaid Other

## 2022-06-30 ENCOUNTER — Emergency Department
Admission: EM | Admit: 2022-06-30 | Discharge: 2022-06-30 | Disposition: A | Discharge status: Home / Self Care | Payer: Medicaid Other | Attending: Emergency Medicine | Admitting: Emergency Medicine

## 2022-06-30 ENCOUNTER — Other Ambulatory Visit: Payer: Self-pay

## 2022-06-30 DIAGNOSIS — R7989 Other specified abnormal findings of blood chemistry: Secondary | ICD-10-CM | POA: Diagnosis not present

## 2022-06-30 DIAGNOSIS — I10 Essential (primary) hypertension: Secondary | ICD-10-CM | POA: Diagnosis not present

## 2022-06-30 DIAGNOSIS — R0600 Dyspnea, unspecified: Secondary | ICD-10-CM

## 2022-06-30 DIAGNOSIS — R0602 Shortness of breath: Secondary | ICD-10-CM | POA: Diagnosis not present

## 2022-06-30 DIAGNOSIS — U071 COVID-19: Secondary | ICD-10-CM | POA: Insufficient documentation

## 2022-06-30 DIAGNOSIS — E119 Type 2 diabetes mellitus without complications: Secondary | ICD-10-CM | POA: Diagnosis not present

## 2022-06-30 DIAGNOSIS — M545 Low back pain, unspecified: Secondary | ICD-10-CM | POA: Diagnosis not present

## 2022-06-30 DIAGNOSIS — R079 Chest pain, unspecified: Secondary | ICD-10-CM | POA: Diagnosis not present

## 2022-06-30 DIAGNOSIS — R519 Headache, unspecified: Secondary | ICD-10-CM | POA: Diagnosis not present

## 2022-06-30 LAB — CBC WITH DIFFERENTIAL/PLATELET
Abs Immature Granulocytes: 0.03 10*3/uL (ref 0.00–0.07)
Basophils Absolute: 0 10*3/uL (ref 0.0–0.1)
Basophils Relative: 1 %
Eosinophils Absolute: 0.1 10*3/uL (ref 0.0–0.5)
Eosinophils Relative: 2 %
HCT: 43.6 % (ref 39.0–52.0)
Hemoglobin: 14.6 g/dL (ref 13.0–17.0)
Immature Granulocytes: 1 %
Lymphocytes Relative: 13 %
Lymphs Abs: 0.8 10*3/uL (ref 0.7–4.0)
MCH: 27.8 pg (ref 26.0–34.0)
MCHC: 33.5 g/dL (ref 30.0–36.0)
MCV: 83 fL (ref 80.0–100.0)
Monocytes Absolute: 0.5 10*3/uL (ref 0.1–1.0)
Monocytes Relative: 8 %
Neutro Abs: 4.8 10*3/uL (ref 1.7–7.7)
Neutrophils Relative %: 75 %
Platelets: 204 10*3/uL (ref 150–400)
RBC: 5.25 MIL/uL (ref 4.22–5.81)
RDW: 13.2 % (ref 11.5–15.5)
WBC: 6.2 10*3/uL (ref 4.0–10.5)
nRBC: 0 % (ref 0.0–0.2)

## 2022-06-30 LAB — COMPREHENSIVE METABOLIC PANEL
ALT: 24 U/L (ref 0–44)
AST: 37 U/L (ref 15–41)
Albumin: 3.9 g/dL (ref 3.5–5.0)
Alkaline Phosphatase: 73 U/L (ref 38–126)
Anion gap: 7 (ref 5–15)
BUN: 14 mg/dL (ref 6–20)
CO2: 28 mmol/L (ref 22–32)
Calcium: 8.9 mg/dL (ref 8.9–10.3)
Chloride: 102 mmol/L (ref 98–111)
Creatinine, Ser: 1.24 mg/dL (ref 0.61–1.24)
GFR, Estimated: >60 mL/min (ref >60)
Glucose, Bld: 174 mg/dL — ABNORMAL HIGH (ref 70–99)
Potassium: 4.2 mmol/L (ref 3.5–5.1)
Sodium: 137 mmol/L (ref 135–145)
Total Bilirubin: 0.5 mg/dL (ref 0.3–1.2)
Total Protein: 7.7 g/dL (ref 6.5–8.1)

## 2022-06-30 LAB — D-DIMER, QUANTITATIVE: D-Dimer, Quant: 0.52 ug/mL-FEU — ABNORMAL HIGH (ref 0.00–0.50)

## 2022-06-30 LAB — TROPONIN I (HIGH SENSITIVITY)
Troponin I (High Sensitivity): 17 ng/L (ref <18)
Troponin I (High Sensitivity): 19 ng/L — ABNORMAL HIGH (ref <18)

## 2022-06-30 LAB — RESP PANEL BY RT-PCR (FLU A&B, COVID) ARPGX2
Influenza A by PCR: NEGATIVE
Influenza B by PCR: NEGATIVE
SARS Coronavirus 2 by RT PCR: POSITIVE — AB

## 2022-06-30 LAB — LACTIC ACID, PLASMA: Lactic Acid, Venous: 1.3 mmol/L (ref 0.5–1.9)

## 2022-06-30 MED ORDER — ACETAMINOPHEN 500 MG PO TABS
1000.0000 mg | ORAL_TABLET | Freq: Once | ORAL | Status: AC
  Administered 2022-06-30: 1000 mg via ORAL
  Filled 2022-06-30: qty 2

## 2022-06-30 MED ORDER — BENZONATATE 100 MG PO CAPS: 100.0000 mg | ORAL_CAPSULE | Freq: Three times a day (TID) | ORAL | 0 refills | Status: DC | PRN

## 2022-06-30 MED ORDER — NIRMATRELVIR/RITONAVIR (PAXLOVID)TABLET: 3.0000 | ORAL_TABLET | Freq: Two times a day (BID) | ORAL | 0 refills | Status: AC

## 2022-06-30 MED ORDER — NIRMATRELVIR/RITONAVIR (PAXLOVID)TABLET: 3.0000 | ORAL_TABLET | Freq: Two times a day (BID) | ORAL | 0 refills | Status: DC

## 2022-06-30 MED ORDER — IOHEXOL 350 MG/ML SOLN
75.0000 mL | Freq: Once | INTRAVENOUS | Status: AC | PRN
  Administered 2022-06-30: 75 mL via INTRAVENOUS

## 2022-06-30 NOTE — ED Provider Notes (Signed)
[facility] Provider Note    Event Date/Time   First MD Initiated Contact with Patient 06/30/22 1222     (approximate)   History   Shortness of Breath   HPI  Cory Rivas is a 56 y.o. male who reports he got short of breath last night.  He developed a headache as well and this morning he took a COVID test and was positive.  He also complained of a dry cough today.  He does not have any chest pain.  He does have some tightness especially when he tries to breathe his chest feels tight.      Physical Exam   Triage Vital Signs: ED Triage Vitals  Enc Vitals Group     BP --      Pulse --      Resp --      Temp --      Temp src --      SpO2 --      Weight 06/30/22 1225 230 lb (104.3 kg)     Height 06/30/22 1225 '5\' 7"'$  (1.702 m)     Head Circumference --      Peak Flow --      Pain Score 06/30/22 1224 4     Pain Loc --      Pain Edu? --      Excl. in [place]? --     Most recent vital signs: Vitals:   06/30/22 1227  BP: (!) 161/82  Pulse: 86  Resp: 20  Temp: 98.5 F (36.9 C)  SpO2: 97%     General: Awake, no distress.  CV:  Good peripheral perfusion.  Heart regular rate and rhythm no audible murmurs Resp:  Normal effort.  Lungs are clear Abd:  Distended but nontender Extremities no edema   ED Results / Procedures / Treatments   Labs (all labs ordered are listed, but only abnormal results are displayed) Labs Reviewed  RESP PANEL BY RT-PCR (FLU A&B, COVID) ARPGX2 - Abnormal; Notable for the following components:      Result Value   SARS Coronavirus 2 by RT PCR POSITIVE (*)    All other components within normal limits  COMPREHENSIVE METABOLIC PANEL - Abnormal; Notable for the following components:   Glucose, Bld 174 (*)    All other components within normal limits  D-DIMER, QUANTITATIVE - Abnormal; Notable for the following components:   D-Dimer, Quant 0.52 (*)    All other components within normal limits  LACTIC ACID, PLASMA  CBC  WITH DIFFERENTIAL/PLATELET  LACTIC ACID, PLASMA  TROPONIN I (HIGH SENSITIVITY)  TROPONIN I (HIGH SENSITIVITY)     EKG  EKG read and interpreted by me shows normal sinus rhythm rate of 88 normal axis there are flipped T's in all the limb leads except aVR and in V3 through V6.  Much of this was present on EKG from 825 last month.  He only T waves that were not flipped were in 3 and F.  Everything else looks very similar to the current EKG.  EKG from 04/02/2022 also looks similar to today's EKG with flipped T waves in 3 and F as well.   RADIOLOGY Chest x-ray read by radiology reviewed by me does not show any acute pathology per radiology.  PROCEDURES:  Critical Care performed:   Procedures   MEDICATIONS ORDERED IN ED: Medications  iohexol (OMNIPAQUE) 350 MG/ML injection 75 mL (75 mLs Intravenous Contrast Given 06/30/22 1535)     IMPRESSION /  MDM / ASSESSMENT AND PLAN / ED COURSE  I reviewed the triage vital signs and the nursing notes. Patient's D-dimer is up.  I have ordered a CT angio to make sure he is not having any blood clots since he does have some shortness of breath and COVID positivity.  COVID is of course a risk factor for blood clots.  If everything looks good we will try to walk him and see if he desaturates.  If she does not he should be able to go home to where he is now.  He will need some Paxlovid as he is obese and has diabetes and hypertension.  I am signing him out to the oncoming provider.  Differentia  Patient's presentation is most consistent with acute presentation with potential threat to life or bodily function.  he patient is on the cardiac monitor to evaluate for evidence of arrhythmia and/or significant heart rate changes.  None have been seen     FINAL CLINICAL IMPRESSION(S) / ED DIAGNOSES   Final diagnoses:  Dyspnea, unspecified type  COVID     Rx / DC Orders   ED Discharge Orders     None        Note:  This document was prepared  using Dragon voice recognition software and may include unintentional dictation errors.   Nena Polio, MD 06/30/22 [phone]

## 2022-06-30 NOTE — Discharge Instructions (Addendum)
Call your cardiologist to make a follow-up for your known coronary disease after you have completed quarantine for your COVID.  It is 10 days from symptom onset.  Hold your crestor rosuvastatin for 8 days while on the paxlovid. Can take 1/2 the dose amlodopine if you notice that blood pressures are getting low.      IMPRESSION: 1. No evidence of pulmonary embolus. 2. Faint interstitial and ground-glass opacities within the mid upper lung zones, compatible with known history of COVID-19 pneumonia. 3. Coronary artery atherosclerosis.

## 2022-06-30 NOTE — ED Provider Notes (Signed)
3:49 PM Assumed care for off going team.   Blood pressure (!) 161/82, pulse 86, temperature 98.5 F (36.9 C), temperature source Oral, resp. rate 20, height '5\' 7"'$  (1.702 m), weight 104.3 kg, SpO2 97 %.  See their HPI for full report but in brief pending CT/ambulation trial:  On recheck of vital signs patient spiked a fever.  We will give a dose of Tylenol.  Patient reports feeling well at this time.  Denies any chest pain.  His troponin slightly increased from 17-19 suspect this is more likely demand from the known COVID.  He has some T wave inversions but these are similar to prior.  He does have a cardiologist that he sees.  We discussed the CT imaging results including some incidental coronary artery disease and he expressed understanding will follow-up with his cardiologist.  IMPRESSION: 1. No evidence of pulmonary embolus. 2. Faint interstitial and ground-glass opacities within the mid upper lung zones, compatible with known history of COVID-19 pneumonia. 3. Coronary artery atherosclerosis.     Ambulatory sat is reassuring.  Patient feels comfortable discharge home.  Patient would like to initiate Paxlovid treatment and Tessalon Perles for cough      Vanessa Ambler, MD 06/30/22 [phone]

## 2022-06-30 NOTE — ED Triage Notes (Signed)
Pt states he has been having SHOB since last night- worse with exertion- pt also having HA and lower back pain- pt does sound congested- pt states tightness when he takes a deep breath

## 2022-06-30 NOTE — ED Notes (Signed)
Pt A&O, IV removed, pt given discharge instructions, pt assisted to vehicle by RN. 

## 2022-06-30 NOTE — ED Notes (Signed)
Pt ambulated approx 23f around unit, pt's lowest oxygen reading was 95% RA, pt maintained 97-98% for the duration of ambulating in unit.

## 2022-07-02 ENCOUNTER — Telehealth: Payer: Self-pay | Admitting: Family Medicine

## 2022-07-02 NOTE — Telephone Encounter (Signed)
Copied from [place] [id number]. Topic: General - Other >> Jul 02, 2022  8:40 AM Rosanne Ashing P wrote: Reason for CRM: Pt called to inform office that he has covid and was in last week .  He went to the ER this weekend and tested positive.

## 2022-07-03 ENCOUNTER — Encounter: Payer: Medicaid Other | Admitting: Physical Therapy

## 2022-07-03 ENCOUNTER — Telehealth: Payer: Self-pay

## 2022-07-03 NOTE — Telephone Encounter (Signed)
Transition Care Management Unsuccessful Follow-up Telephone Call  Date of discharge and from where:  06/30/2022 from [facility]  Attempts:  1st Attempt  Reason for unsuccessful TCM follow-up call:  Left voice message

## 2022-07-05 ENCOUNTER — Ambulatory Visit: Payer: Medicaid Other | Admitting: Podiatry

## 2022-07-05 NOTE — Telephone Encounter (Signed)
Transition Care Management Unsuccessful Follow-up Telephone Call  Date of discharge and from where:  06/30/2022 from [facility]  Attempts:  2nd Attempt  Reason for unsuccessful TCM follow-up call:  Left voice message

## 2022-07-06 NOTE — Telephone Encounter (Signed)
Transition Care Management Unsuccessful Follow-up Telephone Call  Date of discharge and from where:  06/30/2022 from [facility]  Attempts:  3rd Attempt  Reason for unsuccessful TCM follow-up call:  Unable to reach patient

## 2022-07-09 ENCOUNTER — Ambulatory Visit (INDEPENDENT_AMBULATORY_CARE_PROVIDER_SITE_OTHER): Payer: Medicaid Other | Admitting: Family Medicine

## 2022-07-09 ENCOUNTER — Encounter: Payer: Self-pay | Admitting: Family Medicine

## 2022-07-09 VITALS — BP 190/104 | HR 76 | Temp 98.2°F | Resp 16 | Ht 67.0 in | Wt 243.0 lb

## 2022-07-09 DIAGNOSIS — Z09 Encounter for follow-up examination after completed treatment for conditions other than malignant neoplasm: Secondary | ICD-10-CM

## 2022-07-09 DIAGNOSIS — J310 Chronic rhinitis: Secondary | ICD-10-CM

## 2022-07-09 DIAGNOSIS — I10 Essential (primary) hypertension: Secondary | ICD-10-CM | POA: Diagnosis not present

## 2022-07-09 DIAGNOSIS — J329 Chronic sinusitis, unspecified: Secondary | ICD-10-CM | POA: Diagnosis not present

## 2022-07-09 DIAGNOSIS — Z720 Tobacco use: Secondary | ICD-10-CM

## 2022-07-09 DIAGNOSIS — J209 Acute bronchitis, unspecified: Secondary | ICD-10-CM

## 2022-07-09 DIAGNOSIS — H1013 Acute atopic conjunctivitis, bilateral: Secondary | ICD-10-CM

## 2022-07-09 DIAGNOSIS — R0600 Dyspnea, unspecified: Secondary | ICD-10-CM

## 2022-07-09 MED ORDER — LEVOCETIRIZINE DIHYDROCHLORIDE 5 MG PO TABS: 5.0000 mg | ORAL_TABLET | Freq: Every evening | ORAL | 1 refills | Status: DC

## 2022-07-09 NOTE — Progress Notes (Unsigned)
Patient ID: Cory Rivas, male    DOB: [birth year], 56 y.o.   MRN: [id number]  PCP: Delsa Grana, PA-C  Chief Complaint  Patient presents with   Follow-up   Hypertension    Subjective:   Cory Rivas is a 56 y.o. male, presents to clinic with CC of the following:  HPI  Pt presents for f/up He was due to see Cardiology but that appt was pushed back due to pt having covid Here for f/up on BP, rash and now cough/SOB with COVID  Accelerated HTN: On hydralazine 50 mg TID, lisinopril 40 mg, amlodipine 10 No CP, SOB/cough is better than when he was in the ED BP Readings from Last 3 Encounters:  07/09/22 (!) 186/104  06/30/22 136/75  06/27/22 (!) 160/80   Pt denies CP, SOB, exertional sx, LE edema, palpitation, Ha's, visual disturbances, lightheadedness, hypotension, syncope. Dietary efforts for BP? Working on diet and walking again  Still coughing and congested no facial pain and HA   SOB: Prior w/up by cardiology did not show CHF Referred to pulm Heavy smoker, with recurrent bronchitis On symbicort   Rash- resolved       Patient Active Problem List   Diagnosis Date Noted   Grade I diastolic dysfunction 58/06/9832   LVH (left ventricular hypertrophy) 06/08/2022   Accelerated hypertension 06/08/2022   S/P cervical spinal fusion 08/18/2021   Former heavy tobacco smoker 08/18/2021   Gout 08/18/2021   Other chronic pain 82/50/5397   Systolic murmur 67/34/1937   Tobacco use 10/26/2020   Polyp of ascending colon    Rectal polyp    Class 2 severe obesity with serious comorbidity and body mass index (BMI) of 39.0 to 39.9 in adult [facility]) 04/15/2019   Chronic kidney disease (CKD) stage G2/A3, mildly decreased glomerular filtration rate (GFR) between 60-89 mL/min/1.73 square meter and albuminuria creatinine ratio greater than 300 mg/g 04/15/2019   Proteinuria 04/15/2019   Hyperlipidemia associated with type 2 diabetes mellitus ([place]) 03/18/2019   Diabetes mellitus  ([place]) 03/18/2019   Idiopathic chronic gout of multiple sites without tophus 03/18/2019   Essential hypertension 03/18/2019   Dermatitis 03/18/2019   Lumbar degenerative disc disease 11/22/2015   Cervical myelopathy ([place]) 11/22/2015   Osteoarthritis of spine with radiculopathy, lumbar region 11/22/2015      Current Outpatient Medications:    acetaminophen (TYLENOL) 325 MG tablet, SMARTSIG:3 Tablet(s) By Mouth Every 8 Hours PRN, Disp: , Rfl:    allopurinol (ZYLOPRIM) 100 MG tablet, Take 1 tablet (100 mg total) by mouth daily. Take with 300 mg tab (for total of 400 mg once daily), Disp: 90 tablet, Rfl: 3   allopurinol (ZYLOPRIM) 300 MG tablet, Take one 300 mg tab po daily WITH 100 mg tab for total of 400 mg once daily for gout, Disp: 90 tablet, Rfl: 3   amLODipine (NORVASC) 10 MG tablet, Take 1 tablet (10 mg total) by mouth daily., Disp: 90 tablet, Rfl: 3   Baclofen 5 MG TABS, Take 5-10 mg by mouth 3 (three) times daily as needed (msk pain or spasms)., Disp: 90 tablet, Rfl: 3   benzonatate (TESSALON PERLES) 100 MG capsule, Take 1 capsule (100 mg total) by mouth 3 (three) times daily as needed for cough., Disp: 30 capsule, Rfl: 0   budesonide-formoterol (SYMBICORT) 80-4.5 MCG/ACT inhaler, Inhale 2 puffs into the lungs 2 (two) times daily. After use rinse out mouth - swish and spit, Disp: 1 each, Rfl: 1   Continuous Blood Gluc Transmit (DEXCOM  G6 TRANSMITTER) MISC, Use 1 each every 3 (three) months, Disp: , Rfl:    Dulaglutide 1.5 MG/0.5ML SOPN, Inject into the skin., Disp: , Rfl:    DULoxetine (CYMBALTA) 30 MG capsule, Take 1 capsule (30 mg total) by mouth 2 (two) times daily., Disp: 180 capsule, Rfl: 1   ezetimibe (ZETIA) 10 MG tablet, Take 1 tablet (10 mg total) by mouth daily., Disp: 90 tablet, Rfl: 3   hydrALAZINE (APRESOLINE) 50 MG tablet, Take 1 tablet (50 mg total) by mouth 3 (three) times daily. Monitor BP, Disp: 90 tablet, Rfl: 1   hydrOXYzine (VISTARIL) 25 MG capsule, Take 1 capsule (25  mg total) by mouth every 8 (eight) hours as needed for itching., Disp: 30 capsule, Rfl: 1   ibuprofen (ADVIL) 800 MG tablet, Take 800 mg by mouth every 6 (six) hours as needed., Disp: , Rfl:    indomethacin (INDOCIN) 25 MG capsule, TAKE 2 TABLETS (50 MG) BY 3 TIMES DAILY FOR 2 DAYS AT ONSET OF GOUT FLARE UP, THEN TAKE 1('25MG'$ )TABLET BY MOUTH FOR 3 DAYS, Disp: 20 capsule, Rfl: 3   insulin lispro (HUMALOG) 100 UNIT/ML KwikPen, Inject into the skin. Sliding scale up to 45 units per injection, Disp: , Rfl:    levocetirizine (XYZAL) 5 MG tablet, Take 1 tablet (5 mg total) by mouth every evening., Disp: 90 tablet, Rfl: 1   lisinopril (ZESTRIL) 40 MG tablet, Take 1 tablet (40 mg total) by mouth daily., Disp: 90 tablet, Rfl: 3   metFORMIN (GLUCOPHAGE) 1000 MG tablet, TAKE 1 TABLET BY MOUTH TWICE A DAY WITH A MEAL, Disp: 180 tablet, Rfl: 3   mometasone (NASONEX) 50 MCG/ACT nasal spray, Place 2 sprays into the nose daily., Disp: 1 each, Rfl: 12   Olopatadine HCl 0.2 % SOLN, Apply 1 drop to eye 2 (two) times daily as needed (eye allergies)., Disp: 2.5 mL, Rfl: 3   rosuvastatin (CRESTOR) 40 MG tablet, Take 1 tablet (40 mg total) by mouth daily., Disp: 90 tablet, Rfl: 3   triamcinolone ointment (KENALOG) 0.5 %, Apply 1 application. topically 2 (two) times daily., Disp: 30 g, Rfl: 0   TRULICITY 3 TM/1.9QQ SOPN, Inject into the skin., Disp: , Rfl:    VENTOLIN HFA 108 (90 Base) MCG/ACT inhaler, INHALE 2 PUFFS INTO THE LUNGS EVERY 6 HOURS AS NEEDED FOR WHEEZING OR SHORTNESS OF BREATH, Disp: 18 g, Rfl: 2   insulin glargine (LANTUS SOLOSTAR) 100 UNIT/ML Solostar Pen, Inject 20 Units into the skin at bedtime., Disp: , Rfl:    Allergies  Allergen Reactions   Oxycodone Nausea And Vomiting     Social History   Tobacco Use   Smoking status: Every Day    Packs/day: 1.00    Years: 37.00    Total pack years: 37.00    Types: Cigarettes    Start date: 10/19/1989   Smokeless tobacco: Never   Tobacco comments:    30+  years hx smoking 2 ppd, stopped smoking briefly in April 2022 for a surgery and then restarted smoking about 1/2 ppd last 4-5 months   Vaping Use   Vaping Use: Never used  Substance Use Topics   Alcohol use: No   Drug use: No      Chart Review Today: ***  Review of Systems     Objective:   Vitals:   07/09/22 1412 07/09/22 1413  BP: (!) 190/102 (!) 186/104  Pulse: 76   Resp: 16   Temp: 98.2 F (36.8 C)   TempSrc: Oral  SpO2: 96%   Weight: 243 lb (110.2 kg)   Height: '5\' 7"'$  (1.702 m)     Body mass index is 38.06 kg/m.  Physical Exam   Results for orders placed or performed during the hospital encounter of 06/30/22  Resp Panel by RT-PCR (Flu A&B, Covid) Anterior Nasal Swab   Specimen: Anterior Nasal Swab  Result Value Ref Range   SARS Coronavirus 2 by RT PCR POSITIVE (A) NEGATIVE   Influenza A by PCR NEGATIVE NEGATIVE   Influenza B by PCR NEGATIVE NEGATIVE  Comprehensive metabolic panel  Result Value Ref Range   Sodium 137 135 - 145 mmol/L   Potassium 4.2 3.5 - 5.1 mmol/L   Chloride 102 98 - 111 mmol/L   CO2 28 22 - 32 mmol/L   Glucose, Bld 174 (H) 70 - 99 mg/dL   BUN 14 6 - 20 mg/dL   Creatinine, Ser 1.24 0.61 - 1.24 mg/dL   Calcium 8.9 8.9 - 10.3 mg/dL   Total Protein 7.7 6.5 - 8.1 g/dL   Albumin 3.9 3.5 - 5.0 g/dL   AST 37 15 - 41 U/L   ALT 24 0 - 44 U/L   Alkaline Phosphatase 73 38 - 126 U/L   Total Bilirubin 0.5 0.3 - 1.2 mg/dL   GFR, Estimated >60 >60 mL/min   Anion gap 7 5 - 15  Lactic acid, plasma  Result Value Ref Range   Lactic Acid, Venous 1.3 0.5 - 1.9 mmol/L  CBC with Differential  Result Value Ref Range   WBC 6.2 4.0 - 10.5 K/uL   RBC 5.25 4.22 - 5.81 MIL/uL   Hemoglobin 14.6 13.0 - 17.0 g/dL   HCT 43.6 39.0 - 52.0 %   MCV 83.0 80.0 - 100.0 fL   MCH 27.8 26.0 - 34.0 pg   MCHC 33.5 30.0 - 36.0 g/dL   RDW 13.2 11.5 - 15.5 %   Platelets 204 150 - 400 K/uL   nRBC 0.0 0.0 - 0.2 %   Neutrophils Relative % 75 %   Neutro Abs 4.8 1.7 - 7.7  K/uL   Lymphocytes Relative 13 %   Lymphs Abs 0.8 0.7 - 4.0 K/uL   Monocytes Relative 8 %   Monocytes Absolute 0.5 0.1 - 1.0 K/uL   Eosinophils Relative 2 %   Eosinophils Absolute 0.1 0.0 - 0.5 K/uL   Basophils Relative 1 %   Basophils Absolute 0.0 0.0 - 0.1 K/uL   Immature Granulocytes 1 %   Abs Immature Granulocytes 0.03 0.00 - 0.07 K/uL  D-dimer, quantitative  Result Value Ref Range   D-Dimer, Quant 0.52 (H) 0.00 - 0.50 ug/mL-FEU  Troponin I (High Sensitivity)  Result Value Ref Range   Troponin I (High Sensitivity) 17 <18 ng/L  Troponin I (High Sensitivity)  Result Value Ref Range   Troponin I (High Sensitivity) 19 (H) <18 ng/L       Assessment & Plan:   ***     Delsa Grana, PA-C 07/09/22 2:28 PM

## 2022-07-09 NOTE — Patient Instructions (Addendum)
For your congestion and nasal symptoms you can try using only tylenol or tylenol pm, make sure you're taking your allergy pill and nasal spray You can take tessalon and mucinex or cordiciden (BP safe)   DO NOT take sudafed, DM, robitussin, delsym, or any NSAIDS - no ibuprofen, aleve, naproxen or goody powders   For your muscle and back/neck pain you can use tylenol and your muscle relaxer - baclofen   All of the medicines noted above we need to avoid because they will make your blood pressure worse and can hurt your kidneys and stress your heart.

## 2022-07-10 ENCOUNTER — Other Ambulatory Visit: Payer: Self-pay | Admitting: Family Medicine

## 2022-07-10 ENCOUNTER — Encounter: Payer: Medicaid Other | Admitting: Physical Therapy

## 2022-07-10 NOTE — Telephone Encounter (Signed)
Controlled substance database reviewed, no concerns

## 2022-07-11 ENCOUNTER — Encounter: Payer: Self-pay | Admitting: Family Medicine

## 2022-07-16 ENCOUNTER — Encounter: Payer: Medicaid Other | Admitting: Physical Therapy

## 2022-07-19 DIAGNOSIS — R41841 Cognitive communication deficit: Secondary | ICD-10-CM | POA: Diagnosis not present

## 2022-07-23 ENCOUNTER — Encounter: Payer: Self-pay | Admitting: Family Medicine

## 2022-07-23 ENCOUNTER — Other Ambulatory Visit: Payer: Self-pay

## 2022-07-23 ENCOUNTER — Ambulatory Visit (INDEPENDENT_AMBULATORY_CARE_PROVIDER_SITE_OTHER): Payer: Medicaid Other | Admitting: Family Medicine

## 2022-07-23 ENCOUNTER — Emergency Department: Payer: Medicaid Other

## 2022-07-23 VITALS — BP 166/90 | HR 72 | Temp 98.3°F | Resp 32 | Ht 67.0 in | Wt 239.9 lb

## 2022-07-23 DIAGNOSIS — R0600 Dyspnea, unspecified: Secondary | ICD-10-CM | POA: Diagnosis not present

## 2022-07-23 DIAGNOSIS — Z7984 Long term (current) use of oral hypoglycemic drugs: Secondary | ICD-10-CM | POA: Diagnosis not present

## 2022-07-23 DIAGNOSIS — E119 Type 2 diabetes mellitus without complications: Secondary | ICD-10-CM | POA: Diagnosis not present

## 2022-07-23 DIAGNOSIS — J329 Chronic sinusitis, unspecified: Secondary | ICD-10-CM

## 2022-07-23 DIAGNOSIS — Z79899 Other long term (current) drug therapy: Secondary | ICD-10-CM | POA: Diagnosis not present

## 2022-07-23 DIAGNOSIS — I10 Essential (primary) hypertension: Secondary | ICD-10-CM | POA: Diagnosis not present

## 2022-07-23 DIAGNOSIS — J449 Chronic obstructive pulmonary disease, unspecified: Secondary | ICD-10-CM | POA: Diagnosis not present

## 2022-07-23 DIAGNOSIS — Z7951 Long term (current) use of inhaled steroids: Secondary | ICD-10-CM | POA: Insufficient documentation

## 2022-07-23 DIAGNOSIS — R0682 Tachypnea, not elsewhere classified: Secondary | ICD-10-CM

## 2022-07-23 DIAGNOSIS — J40 Bronchitis, not specified as acute or chronic: Secondary | ICD-10-CM | POA: Diagnosis not present

## 2022-07-23 DIAGNOSIS — Z794 Long term (current) use of insulin: Secondary | ICD-10-CM | POA: Diagnosis not present

## 2022-07-23 DIAGNOSIS — R0602 Shortness of breath: Secondary | ICD-10-CM | POA: Insufficient documentation

## 2022-07-23 LAB — BASIC METABOLIC PANEL
Anion gap: 8 (ref 5–15)
BUN: 19 mg/dL (ref 6–20)
CO2: 24 mmol/L (ref 22–32)
Calcium: 9.2 mg/dL (ref 8.9–10.3)
Chloride: 104 mmol/L (ref 98–111)
Creatinine, Ser: 1.15 mg/dL (ref 0.61–1.24)
GFR, Estimated: >60 mL/min (ref >60)
Glucose, Bld: 180 mg/dL — ABNORMAL HIGH (ref 70–99)
Potassium: 3.7 mmol/L (ref 3.5–5.1)
Sodium: 136 mmol/L (ref 135–145)

## 2022-07-23 LAB — CBC
HCT: 42.3 % (ref 39.0–52.0)
Hemoglobin: 14 g/dL (ref 13.0–17.0)
MCH: 28.2 pg (ref 26.0–34.0)
MCHC: 33.1 g/dL (ref 30.0–36.0)
MCV: 85.1 fL (ref 80.0–100.0)
Platelets: 206 10*3/uL (ref 150–400)
RBC: 4.97 MIL/uL (ref 4.22–5.81)
RDW: 13.4 % (ref 11.5–15.5)
WBC: 9.2 10*3/uL (ref 4.0–10.5)
nRBC: 0 % (ref 0.0–0.2)

## 2022-07-23 LAB — TROPONIN I (HIGH SENSITIVITY)
Troponin I (High Sensitivity): 17 ng/L (ref <18)
Troponin I (High Sensitivity): 18 ng/L — ABNORMAL HIGH (ref <18)

## 2022-07-23 MED ORDER — BUDESONIDE-FORMOTEROL FUMARATE 160-4.5 MCG/ACT IN AERO: 2.0000 | INHALATION_SPRAY | Freq: Two times a day (BID) | RESPIRATORY_TRACT | 3 refills | Status: DC

## 2022-07-23 MED ORDER — IPRATROPIUM-ALBUTEROL 0.5-2.5 (3) MG/3ML IN SOLN: 3.0000 mL | Freq: Four times a day (QID) | RESPIRATORY_TRACT | 1 refills | Status: DC | PRN

## 2022-07-23 NOTE — ED Triage Notes (Signed)
Pt to ED for feeling SOB since about 5 days ago. Had covid 3 weeks ago. States has had other episodes of SOB in past. Complains of upper abdominal soreness and congestion.  Also had concussion 1 month ago, seen here.  Pt has BG on R lower abdomen.

## 2022-07-23 NOTE — ED Notes (Signed)
Blue top sent with bloodwork.

## 2022-07-23 NOTE — ED Provider Triage Note (Signed)
  Emergency Medicine Provider Triage Evaluation Note  Cory Rivas , a 56 y.o.male,  was evaluated in triage.  Pt complains of shortness of breath.  Patient states he has been feeling short of breath for the past 5 days.  He does have a COVID approximately 3 weeks ago.  He has been coughing a lot recently, causing soreness in his epigastric region.   Review of Systems  Positive: Shortness of breath Negative: Denies fever, chest pain, vomiting  Physical Exam   Vitals:   07/23/22 1501  BP: (!) 183/80  Pulse: 63  Resp: 20  Temp: 98.5 F (36.9 C)  SpO2: 96%   Gen:   Awake, no distress   Resp:  Normal effort  MSK:   Moves extremities without difficulty  Other:    Medical Decision Making  Given the patient's initial medical screening exam, the following diagnostic evaluation has been ordered. The patient will be placed in the appropriate treatment space, once one is available, to complete the evaluation and treatment. I have discussed the plan of care with the patient and I have advised the patient that an ED physician or mid-level practitioner will reevaluate their condition after the test results have been received, as the results may give them additional insight into the type of treatment they may need.    Diagnostics: Labs, CXR, EKG  Treatments: none immediately   Teodoro Spray, [place] 07/23/22 1546

## 2022-07-23 NOTE — Progress Notes (Signed)
Patient ID: Cory Rivas, male    DOB: 1965-10-29, 56 y.o.   MRN: [id number]  PCP: Delsa Grana, PA-C  Chief Complaint  Patient presents with   Shortness of Breath    Subjective:   Cory Rivas is a 56 y.o. male, presents to clinic with CC of the following:  HPI  Pt come in for 2 week BP recheck, he has not started home BP readings still, unfortunately his BP is still elevated similar to multiple readings here which he states is secondary to anxiety  He then got an appt with CC of SOB Pt has COPD, sig smoking hx, recently DOE and SOB were improved with maintenance inhaler and tx of COPD exacerbation and use of rescue inhaler He recently had COVID (about 3 weeks ago) we saw him 2 weeks ago and all sx were improved w/o increased respiratory sx. He reports about a week ago having worse DOE, wheeze, productive cough.  Today is a little better.    Patient Active Problem List   Diagnosis Date Noted   Grade I diastolic dysfunction 41/93/7902   LVH (left ventricular hypertrophy) 06/08/2022   Accelerated hypertension 06/08/2022   S/P cervical spinal fusion 08/18/2021   Former heavy tobacco smoker 08/18/2021   Gout 08/18/2021   Other chronic pain 40/97/3532   Systolic murmur 99/24/2683   Tobacco use 10/26/2020   Polyp of ascending colon    Rectal polyp    Class 2 severe obesity with serious comorbidity and body mass index (BMI) of 39.0 to 39.9 in adult [facility]) 04/15/2019   Chronic kidney disease (CKD) stage G2/A3, mildly decreased glomerular filtration rate (GFR) between 60-89 mL/min/1.73 square meter and albuminuria creatinine ratio greater than 300 mg/g 04/15/2019   Proteinuria 04/15/2019   Hyperlipidemia associated with type 2 diabetes mellitus ([place]) 03/18/2019   Diabetes mellitus ([place]) 03/18/2019   Idiopathic chronic gout of multiple sites without tophus 03/18/2019   Essential hypertension 03/18/2019   Dermatitis 03/18/2019   Lumbar degenerative disc disease 11/22/2015    Cervical myelopathy ([place]) 11/22/2015   Osteoarthritis of spine with radiculopathy, lumbar region 11/22/2015      Current Outpatient Medications:    acetaminophen (TYLENOL) 325 MG tablet, SMARTSIG:3 Tablet(s) By Mouth Every 8 Hours PRN, Disp: , Rfl:    allopurinol (ZYLOPRIM) 100 MG tablet, Take 1 tablet (100 mg total) by mouth daily. Take with 300 mg tab (for total of 400 mg once daily), Disp: 90 tablet, Rfl: 3   allopurinol (ZYLOPRIM) 300 MG tablet, Take one 300 mg tab po daily WITH 100 mg tab for total of 400 mg once daily for gout, Disp: 90 tablet, Rfl: 3   amLODipine (NORVASC) 10 MG tablet, Take 1 tablet (10 mg total) by mouth daily., Disp: 90 tablet, Rfl: 3   Baclofen 5 MG TABS, Take 5-10 mg by mouth 3 (three) times daily as needed (msk pain or spasms)., Disp: 90 tablet, Rfl: 3   benzonatate (TESSALON PERLES) 100 MG capsule, Take 1 capsule (100 mg total) by mouth 3 (three) times daily as needed for cough., Disp: 30 capsule, Rfl: 0   budesonide-formoterol (SYMBICORT) 80-4.5 MCG/ACT inhaler, Inhale 2 puffs into the lungs 2 (two) times daily. After use rinse out mouth - swish and spit, Disp: 1 each, Rfl: 1   Continuous Blood Gluc Transmit (DEXCOM G6 TRANSMITTER) MISC, Use 1 each every 3 (three) months, Disp: , Rfl:    Dulaglutide 1.5 MG/0.5ML SOPN, Inject into the skin., Disp: , Rfl:  DULoxetine (CYMBALTA) 30 MG capsule, Take 1 capsule (30 mg total) by mouth 2 (two) times daily., Disp: 180 capsule, Rfl: 1   ezetimibe (ZETIA) 10 MG tablet, Take 1 tablet (10 mg total) by mouth daily., Disp: 90 tablet, Rfl: 3   hydrALAZINE (APRESOLINE) 50 MG tablet, Take 1 tablet (50 mg total) by mouth 3 (three) times daily. Monitor BP, Disp: 90 tablet, Rfl: 1   hydrOXYzine (VISTARIL) 25 MG capsule, Take 1 capsule (25 mg total) by mouth every 8 (eight) hours as needed for itching., Disp: 30 capsule, Rfl: 1   indomethacin (INDOCIN) 25 MG capsule, TAKE 2 TABLETS (50 MG) BY 3 TIMES DAILY FOR 2 DAYS AT ONSET OF GOUT  FLARE UP, THEN TAKE 1('25MG'$ )TABLET BY MOUTH FOR 3 DAYS, Disp: 20 capsule, Rfl: 3   insulin lispro (HUMALOG) 100 UNIT/ML KwikPen, Inject into the skin. Sliding scale up to 45 units per injection, Disp: , Rfl:    levocetirizine (XYZAL) 5 MG tablet, Take 1 tablet (5 mg total) by mouth every evening., Disp: 90 tablet, Rfl: 1   lisinopril (ZESTRIL) 40 MG tablet, Take 1 tablet (40 mg total) by mouth daily., Disp: 90 tablet, Rfl: 3   metFORMIN (GLUCOPHAGE) 1000 MG tablet, TAKE 1 TABLET BY MOUTH TWICE A DAY WITH A MEAL, Disp: 180 tablet, Rfl: 3   mometasone (NASONEX) 50 MCG/ACT nasal spray, Place 2 sprays into the nose daily., Disp: 1 each, Rfl: 12   Olopatadine HCl 0.2 % SOLN, Apply 1 drop to eye 2 (two) times daily as needed (eye allergies)., Disp: 2.5 mL, Rfl: 3   pregabalin (LYRICA) 25 MG capsule, TAKE 1 CAPSULE BY MOUTH IN THE MORNING AND 1 TO 3 CAPSULES EVERY NIGHT FOR CERVICAL RADICULOPATHY AND NEUROPATHY, Disp: 270 capsule, Rfl: 1   rosuvastatin (CRESTOR) 40 MG tablet, Take 1 tablet (40 mg total) by mouth daily., Disp: 90 tablet, Rfl: 3   triamcinolone ointment (KENALOG) 0.5 %, Apply 1 application. topically 2 (two) times daily., Disp: 30 g, Rfl: 0   TRULICITY 3 ZO/1.0RU SOPN, Inject into the skin., Disp: , Rfl:    VENTOLIN HFA 108 (90 Base) MCG/ACT inhaler, INHALE 2 PUFFS INTO THE LUNGS EVERY 6 HOURS AS NEEDED FOR WHEEZING OR SHORTNESS OF BREATH, Disp: 18 g, Rfl: 2   insulin glargine (LANTUS SOLOSTAR) 100 UNIT/ML Solostar Pen, Inject 20 Units into the skin at bedtime., Disp: , Rfl:    Allergies  Allergen Reactions   Oxycodone Nausea And Vomiting     Social History   Tobacco Use   Smoking status: Every Day    Packs/day: 1.00    Years: 37.00    Total pack years: 37.00    Types: Cigarettes    Start date: 10/19/1989   Smokeless tobacco: Never   Tobacco comments:    30+ years hx smoking 2 ppd, stopped smoking briefly in April 2022 for a surgery and then restarted smoking about 1/2 ppd last  4-5 months   Vaping Use   Vaping Use: Never used  Substance Use Topics   Alcohol use: No   Drug use: No      Chart Review Today: I personally reviewed active problem list, medication list, allergies, family history, social history, health maintenance, notes from last encounter, lab results, imaging with the patient/caregiver today.   Review of Systems  Constitutional: Negative.  Negative for fatigue and fever.  HENT:  Positive for congestion, postnasal drip and rhinorrhea.   Eyes: Negative.   Respiratory:  Positive for cough, chest tightness,  shortness of breath and wheezing.   Cardiovascular:  Positive for chest pain. Negative for palpitations and leg swelling.  Gastrointestinal: Negative.   Endocrine: Negative.   Genitourinary: Negative.   Musculoskeletal: Negative.   Skin: Negative.   Allergic/Immunologic: Negative.   Neurological: Negative.   Hematological: Negative.   Psychiatric/Behavioral: Negative.    All other systems reviewed and are negative.      Objective:   Vitals:   07/23/22 1313 07/23/22 1347  BP: (!) 170/88 (!) 166/90  Pulse: 72   Resp: 18 (!) 32  Temp: 98.3 F (36.8 C)   TempSrc: Oral   SpO2: 93%   Weight: 239 lb 14.4 oz (108.8 kg)   Height: '5\' 7"'$  (1.702 m)     Body mass index is 37.57 kg/m.  Physical Exam Vitals and nursing note reviewed.  Constitutional:      General: He is not in acute distress.    Appearance: He is well-developed. He is obese. He is not ill-appearing, toxic-appearing or diaphoretic.  HENT:     Head: Normocephalic and atraumatic.     Nose: Nose normal.  Eyes:     General:        Right eye: No discharge.        Left eye: No discharge.     Conjunctiva/sclera: Conjunctivae normal.  Neck:     Trachea: No tracheal deviation.  Cardiovascular:     Rate and Rhythm: Normal rate and regular rhythm.  Pulmonary:     Effort: Pulmonary effort is normal. Tachypnea present. No accessory muscle usage or retractions.     Breath  sounds: Decreased air movement present. No stridor. Examination of the right-middle field reveals decreased breath sounds and rales. Examination of the left-middle field reveals decreased breath sounds and rales. Examination of the right-lower field reveals decreased breath sounds. Examination of the left-lower field reveals decreased breath sounds. Decreased breath sounds and rales present.     Comments: Very diminishes BS throughout [place] shallow rapid respirations, manually counted during exam 32-36 at rest in exam room and with standing in exam room Musculoskeletal:        General: Normal range of motion.  Skin:    General: Skin is warm and dry.     Findings: No rash.  Neurological:     Mental Status: He is alert.     Motor: No abnormal muscle tone.     Coordination: Coordination normal.  Psychiatric:        Behavior: Behavior normal.      Results for orders placed or performed during the hospital encounter of 06/30/22  Resp Panel by RT-PCR (Flu A&B, Covid) Anterior Nasal Swab   Specimen: Anterior Nasal Swab  Result Value Ref Range   SARS Coronavirus 2 by RT PCR POSITIVE (A) NEGATIVE   Influenza A by PCR NEGATIVE NEGATIVE   Influenza B by PCR NEGATIVE NEGATIVE  Comprehensive metabolic panel  Result Value Ref Range   Sodium 137 135 - 145 mmol/L   Potassium 4.2 3.5 - 5.1 mmol/L   Chloride 102 98 - 111 mmol/L   CO2 28 22 - 32 mmol/L   Glucose, Bld 174 (H) 70 - 99 mg/dL   BUN 14 6 - 20 mg/dL   Creatinine, Ser 1.24 0.61 - 1.24 mg/dL   Calcium 8.9 8.9 - 10.3 mg/dL   Total Protein 7.7 6.5 - 8.1 g/dL   Albumin 3.9 3.5 - 5.0 g/dL   AST 37 15 - 41 U/L   ALT 24 0 -  44 U/L   Alkaline Phosphatase 73 38 - 126 U/L   Total Bilirubin 0.5 0.3 - 1.2 mg/dL   GFR, Estimated >60 >60 mL/min   Anion gap 7 5 - 15  Lactic acid, plasma  Result Value Ref Range   Lactic Acid, Venous 1.3 0.5 - 1.9 mmol/L  CBC with Differential  Result Value Ref Range   WBC 6.2 4.0 - 10.5 K/uL   RBC 5.25 4.22  - 5.81 MIL/uL   Hemoglobin 14.6 13.0 - 17.0 g/dL   HCT 43.6 39.0 - 52.0 %   MCV 83.0 80.0 - 100.0 fL   MCH 27.8 26.0 - 34.0 pg   MCHC 33.5 30.0 - 36.0 g/dL   RDW 13.2 11.5 - 15.5 %   Platelets 204 150 - 400 K/uL   nRBC 0.0 0.0 - 0.2 %   Neutrophils Relative % 75 %   Neutro Abs 4.8 1.7 - 7.7 K/uL   Lymphocytes Relative 13 %   Lymphs Abs 0.8 0.7 - 4.0 K/uL   Monocytes Relative 8 %   Monocytes Absolute 0.5 0.1 - 1.0 K/uL   Eosinophils Relative 2 %   Eosinophils Absolute 0.1 0.0 - 0.5 K/uL   Basophils Relative 1 %   Basophils Absolute 0.0 0.0 - 0.1 K/uL   Immature Granulocytes 1 %   Abs Immature Granulocytes 0.03 0.00 - 0.07 K/uL  D-dimer, quantitative  Result Value Ref Range   D-Dimer, Quant 0.52 (H) 0.00 - 0.50 ug/mL-FEU  Troponin I (High Sensitivity)  Result Value Ref Range   Troponin I (High Sensitivity) 17 <18 ng/L  Troponin I (High Sensitivity)  Result Value Ref Range   Troponin I (High Sensitivity) 19 (H) <18 ng/L       Assessment & Plan:   1. Tachypnea PT sent to ED due to tachypnea and increased WOB with very difficult to hear and very very diminished BS throughout despite multiple efforts to encourage him to take deep breath in and out through mouth. No wheeze retractions or accessory muscle use - but tachypneic, DOE - very SOB with trying to get ambulatory pulse ox in clinic, no hypoxia but sx and increased WOB - with accelerated htn, recent COVID, multiple recent pulm ER visits and likely undiagnosed COPD - with RR sent to ED for eval  - ipratropium-albuterol (DUONEB) 0.5-2.5 (3) MG/3ML SOLN; Take 3 mLs by nebulization every 6 (six) hours as needed (SOB wheeze cough).  Dispense: 180 mL; Refill: 1  2. Dyspnea, unspecified type Have been concerned for cardiac and pulm factors and likely some OHS He has some improvement with adding inhalers He has been waiting for cardiology consult to reevaluate for accellerated htn and recheck for CHF - Ambulatory referral to  Pulmonology - ipratropium-albuterol (DUONEB) 0.5-2.5 (3) MG/3ML SOLN; Take 3 mLs by nebulization every 6 (six) hours as needed (SOB wheeze cough).  Dispense: 180 mL; Refill: 1  3. Essential hypertension Still uncontrolled in clinic He is pending cardiology visit' Today sent to the ED with HTN, SOB, increase WOB, chest pain, diminished BS throughout - concern for delayed out pt work up - not his typical wheezy presentation He was due to continue same meds and monitor BP at home - but he still has not gotten a new cuff   - budesonide-formoterol (SYMBICORT) 160-4.5 MCG/ACT inhaler; Inhale 2 puffs into the lungs 2 (two) times daily. And can use 2 puffs prn up to every 6 hours for wheeze, SOB  Dispense: 1 each; Refill: 3 - ipratropium-albuterol (  DUONEB) 0.5-2.5 (3) MG/3ML SOLN; Take 3 mLs by nebulization every 6 (six) hours as needed (SOB wheeze cough).  Dispense: 180 mL; Refill: 1  Chronic sinusitis with recurrent bronchitis For outpt management - needs pulm eval and PFT Increase dose on symbicort - nebs prn He is supposed to be on allergy/nasal meds - hx of severe and recurrent sinusitis with allergy triggers  - budesonide-formoterol (SYMBICORT) 160-4.5 MCG/ACT inhaler; Inhale 2 puffs into the lungs 2 (two) times daily. And can use 2 puffs prn up to every 6 hours for wheeze, SOB  Dispense: 1 each; Refill: 3 - Ambulatory referral to Pulmonology   PT to go to ED POV for SOB/tachypnea/DOE and diminished BS with increased WOB and abnormal lung exam in clinic - did not feel with RR 32+ pt was candidate for outpt work up    [employer], PA-C 07/23/22 1:54 PM

## 2022-07-24 ENCOUNTER — Emergency Department
Admission: EM | Admit: 2022-07-24 | Discharge: 2022-07-24 | Disposition: A | Discharge status: Home / Self Care | Payer: Medicaid Other | Attending: Emergency Medicine | Admitting: Emergency Medicine

## 2022-07-24 DIAGNOSIS — R0602 Shortness of breath: Secondary | ICD-10-CM

## 2022-07-24 LAB — HEPATIC FUNCTION PANEL
ALT: 18 U/L (ref 0–44)
AST: 23 U/L (ref 15–41)
Albumin: 3.8 g/dL (ref 3.5–5.0)
Alkaline Phosphatase: 65 U/L (ref 38–126)
Bilirubin, Direct: <0.1 mg/dL (ref 0.0–0.2)
Total Bilirubin: 0.7 mg/dL (ref 0.3–1.2)
Total Protein: 7.3 g/dL (ref 6.5–8.1)

## 2022-07-24 LAB — LIPASE, BLOOD: Lipase: 46 U/L (ref 11–51)

## 2022-07-24 NOTE — ED Provider Notes (Signed)
[facility] Provider Note    Event Date/Time   First MD Initiated Contact with Patient 07/24/22 0102     (approximate)   History   Shortness of Breath   HPI  Cory Rivas is a 56 y.o. male with history of hypertension, diabetes, hyperlipidemia, COPD not on oxygen who presents to the emergency department with shortness of breath worse with exertion and wheezing for the past few days.  He denies any chest pain, lower extremity swelling or pain, history of PE or DVT.  Was seen by his primary care provider today and had increased respiratory rate and was wheezing.  States he ran out of his breathing treatments at home but has prescriptions at the pharmacy to pick up tomorrow.  Recently had COVID-19 about 3 weeks ago.  States since being in the waiting room, symptoms have completely resolved and he is feeling great and ready for discharge home.  No longer feeling short of breath or having any wheezing.   History provided by patient.    Past Medical History:  Diagnosis Date   Bulging of cervical intervertebral disc    Diabetes mellitus without complication (HCC)    Gout    High cholesterol    Hypertension    Stress due to family tension 04/15/2019    Past Surgical History:  Procedure Laterality Date   CHOLECYSTECTOMY     COLONOSCOPY WITH PROPOFOL N/A 03/10/2020   Procedure: COLONOSCOPY WITH PROPOFOL;  Surgeon: Lucilla Lame, MD;  Location: [facility] ENDOSCOPY;  Service: Endoscopy;  Laterality: N/A;    MEDICATIONS:  Prior to Admission medications   Medication Sig Start Date End Date Taking? Authorizing Provider  acetaminophen (TYLENOL) 325 MG tablet SMARTSIG:3 Tablet(s) By Mouth Every 8 Hours PRN 02/07/21   [provider]  allopurinol (ZYLOPRIM) 100 MG tablet Take 1 tablet (100 mg total) by mouth daily. Take with 300 mg tab (for total of 400 mg once daily) 09/04/21   Delsa Grana, PA-C  allopurinol (ZYLOPRIM) 300 MG tablet Take one 300 mg tab po daily  WITH 100 mg tab for total of 400 mg once daily for gout 09/04/21   Delsa Grana, PA-C  amLODipine (NORVASC) 10 MG tablet Take 1 tablet (10 mg total) by mouth daily. 01/26/22   Delsa Grana, PA-C  Baclofen 5 MG TABS Take 5-10 mg by mouth 3 (three) times daily as needed (msk pain or spasms). 03/06/22   Delsa Grana, PA-C  benzonatate (TESSALON PERLES) 100 MG capsule Take 1 capsule (100 mg total) by mouth 3 (three) times daily as needed for cough. 06/30/22 06/30/23  Vanessa Altamont, MD  budesonide-formoterol (SYMBICORT) 160-4.5 MCG/ACT inhaler Inhale 2 puffs into the lungs 2 (two) times daily. And can use 2 puffs prn up to every 6 hours for wheeze, SOB 07/23/22   Delsa Grana, PA-C  Continuous Blood Gluc Transmit (DEXCOM G6 TRANSMITTER) MISC Use 1 each every 3 (three) months 12/06/21   [provider]  Dulaglutide 1.5 MG/0.5ML SOPN Inject into the skin. 12/16/20   [provider]  DULoxetine (CYMBALTA) 30 MG capsule Take 1 capsule (30 mg total) by mouth 2 (two) times daily. 03/06/22   Delsa Grana, PA-C  ezetimibe (ZETIA) 10 MG tablet Take 1 tablet (10 mg total) by mouth daily. 03/06/22   Delsa Grana, PA-C  hydrALAZINE (APRESOLINE) 50 MG tablet Take 1 tablet (50 mg total) by mouth 3 (three) times daily. Monitor BP 06/21/22   Delsa Grana, PA-C  hydrOXYzine (VISTARIL) 25 MG capsule  Take 1 capsule (25 mg total) by mouth every 8 (eight) hours as needed for itching. 06/21/22   Delsa Grana, PA-C  indomethacin (INDOCIN) 25 MG capsule TAKE 2 TABLETS (50 MG) BY 3 TIMES DAILY FOR 2 DAYS AT ONSET OF GOUT FLARE UP, THEN TAKE 1('25MG'$ )TABLET BY MOUTH FOR 3 DAYS 09/05/21   Delsa Grana, PA-C  insulin glargine (LANTUS SOLOSTAR) 100 UNIT/ML Solostar Pen Inject 20 Units into the skin at bedtime. 11/01/20 06/30/22  [provider]  insulin lispro (HUMALOG) 100 UNIT/ML KwikPen Inject into the skin. Sliding scale up to 45 units per injection 11/08/20   [provider]  ipratropium-albuterol (DUONEB) 0.5-2.5  (3) MG/3ML SOLN Take 3 mLs by nebulization every 6 (six) hours as needed (SOB wheeze cough). 07/23/22   Delsa Grana, PA-C  levocetirizine (XYZAL) 5 MG tablet Take 1 tablet (5 mg total) by mouth every evening. 07/09/22   Delsa Grana, PA-C  lisinopril (ZESTRIL) 40 MG tablet Take 1 tablet (40 mg total) by mouth daily. 03/06/22   Delsa Grana, PA-C  metFORMIN (GLUCOPHAGE) 1000 MG tablet TAKE 1 TABLET BY MOUTH TWICE A DAY WITH A MEAL 03/06/22   Delsa Grana, PA-C  mometasone (NASONEX) 50 MCG/ACT nasal spray Place 2 sprays into the nose daily. 01/29/22   Delsa Grana, PA-C  Olopatadine HCl 0.2 % SOLN Apply 1 drop to eye 2 (two) times daily as needed (eye allergies). 01/29/22   Delsa Grana, PA-C  pregabalin (LYRICA) 25 MG capsule TAKE 1 CAPSULE BY MOUTH IN THE MORNING AND 1 TO 3 CAPSULES EVERY NIGHT FOR CERVICAL RADICULOPATHY AND NEUROPATHY 07/10/22   Delsa Grana, PA-C  rosuvastatin (CRESTOR) 40 MG tablet Take 1 tablet (40 mg total) by mouth daily. 01/26/22   Delsa Grana, PA-C  triamcinolone ointment (KENALOG) 0.5 % Apply 1 application. topically 2 (two) times daily. 01/11/22   Teodora Medici, DO  TRULICITY 3 NT/6.1WE SOPN Inject into the skin. 03/26/22   [provider]  VENTOLIN HFA 108 (90 Base) MCG/ACT inhaler INHALE 2 PUFFS INTO THE LUNGS EVERY 6 HOURS AS NEEDED FOR WHEEZING OR SHORTNESS OF BREATH 04/05/22   Delsa Grana, PA-C    Physical Exam   Triage Vital Signs: ED Triage Vitals  Enc Vitals Group     BP 07/23/22 1501 (!) 183/80     Pulse Rate 07/23/22 1501 63     Resp 07/23/22 1501 20     Temp 07/23/22 1501 98.5 F (36.9 C)     Temp Source 07/23/22 1501 Oral     SpO2 07/23/22 1501 96 %     Weight 07/23/22 1503 238 lb 1.6 oz (108 kg)     Height 07/23/22 1503 '5\' 7"'$  (1.702 m)     Head Circumference --      Peak Flow --      Pain Score 07/23/22 1502 0     Pain Loc --      Pain Edu? --      Excl. in [place]? --     Most recent vital signs: Vitals:   07/23/22 2333 07/24/22 0127   BP: (!) 174/64 (!) 171/62  Pulse: 69   Resp: 17 18  Temp: 98.1 F (36.7 C) 98.3 F (36.8 C)  SpO2: 94% 95%    CONSTITUTIONAL: Alert and oriented and responds appropriately to questions. Well-appearing; well-nourished, obese, in no distress, ambulates without difficulty HEAD: Normocephalic, atraumatic EYES: Conjunctivae clear, pupils appear equal, sclera nonicteric ENT: normal nose; moist mucous membranes NECK: Supple, normal ROM, no JVD CARD:  RRR; S1 and S2 appreciated; no murmurs, no clicks, no rubs, no gallops RESP: Normal chest excursion without splinting or tachypnea; breath sounds clear and equal bilaterally; no wheezes, no rhonchi, no rales, no hypoxia or respiratory distress, speaking full sentences, good aeration at bases bilaterally ABD/GI: Normal bowel sounds; non-distended; soft, non-tender, no rebound, no guarding, no peritoneal signs BACK: The back appears normal EXT: Normal ROM in all joints; no deformity noted, no edema; no cyanosis, no calf tenderness or calf swelling SKIN: Normal color for age and race; warm; no rash on exposed skin NEURO: Moves all extremities equally, normal speech PSYCH: The patient's mood and manner are appropriate.   ED Results / Procedures / Treatments   LABS: (all labs ordered are listed, but only abnormal results are displayed) Labs Reviewed  BASIC METABOLIC PANEL - Abnormal; Notable for the following components:      Result Value   Glucose, Bld 180 (*)    All other components within normal limits  TROPONIN I (HIGH SENSITIVITY) - Abnormal; Notable for the following components:   Troponin I (High Sensitivity) 18 (*)    All other components within normal limits  CBC  HEPATIC FUNCTION PANEL  LIPASE, BLOOD  TROPONIN I (HIGH SENSITIVITY)     EKG:  EKG Interpretation  Date/Time:  Monday July 23 2022 15:05:35 EDT Ventricular Rate:  60 PR Interval:  154 QRS Duration: 82 QT Interval:  392 QTC Calculation: 392 R Axis:   56 Text  Interpretation: Normal sinus rhythm ST & T wave abnormality, consider inferolateral ischemia Abnormal ECG When compared with ECG of 30-Jun-2022 12:27, PREVIOUS ECG IS PRESENT No significant change since last tracing Confirmed by Pryor Curia [phone]) on 07/24/2022 1:23:03 AM         RADIOLOGY: My personal review and interpretation of imaging: Chest x-ray clear.  I have personally reviewed all radiology reports.   DG Chest 2 View  Result Date: 07/23/2022 CLINICAL DATA:  Dyspnea EXAM: CHEST - 2 VIEW COMPARISON:  06/30/2022 chest radiograph. FINDINGS: Partially visualized bilateral posterior spinal fusion hardware in the upper thoracic and cervical spine. Stable cardiomediastinal silhouette with normal heart size. No pneumothorax. No pleural effusion. Lungs appear clear, with no acute consolidative airspace disease and no pulmonary edema. IMPRESSION: No active cardiopulmonary disease. Electronically Signed   By: Ilona Sorrel M.D.   On: 07/23/2022 15:28     PROCEDURES:  Critical Care performed: No     Procedures    IMPRESSION / MDM / ASSESSMENT AND PLAN / ED COURSE  I reviewed the triage vital signs and the nursing notes.    Patient here with shortness of breath and wheezing.  Symptoms now completely resolved.  Was tachypneic in his PCPs office today but respiratory rate now 17.  States he is feeling much better.  No chest pain.  The patient is on the cardiac monitor to evaluate for evidence of arrhythmia and/or significant heart rate changes.   DIFFERENTIAL DIAGNOSIS (includes but not limited to):   COPD exacerbation, pneumonia, long COVID, no signs of CHF exacerbation, less likely PE given asymptomatic currently without tachycardia, tachypnea, hypoxia   Patient's presentation is most consistent with acute presentation with potential threat to life or bodily function.   PLAN: CBC, BMP, troponin x2, chest x-ray, EKG obtained from triage. EKG shows no new ischemic change  compared to previous and no arrhythmia.  Chest x-ray reviewed and interpreted by myself and the radiologist and shows no infiltrate, edema, pneumothorax.  No leukocytosis.  Normal hemoglobin.  Normal electrolytes and renal function.  Troponin x2 is negative, flat.  Patient states he is feeling much better and is now asymptomatic and his lungs are clear to auscultation with good aeration with no hypoxia, respiratory distress or increased work of breathing.  He has received refills of his medications for home.  No indication for breathing treatments or steroids here in the ED.  I feel he is safe for discharge home with close outpatient follow-up.  MEDICATIONS GIVEN IN ED: Medications - No data to display   ED COURSE:  At this time, I do not feel there is any life-threatening condition present. I reviewed all nursing notes, vitals, pertinent previous records.  All lab and urine results, EKGs, imaging ordered have been independently reviewed and interpreted by myself.  I reviewed all available radiology reports from any imaging ordered this visit.  Based on my assessment, I feel the patient is safe to be discharged home without further emergent workup and can continue workup as an outpatient as needed. Discussed all findings, treatment plan as well as usual and customary return precautions.  They verbalize understanding and are comfortable with this plan.  Outpatient follow-up has been provided as needed.  All questions have been answered.    CONSULTS: Mission considered but work-up reassuring and patient now asymptomatic and requesting discharge.   OUTSIDE RECORDS REVIEWED: Reviewed patient's previous PCP note from yesterday.       FINAL CLINICAL IMPRESSION(S) / ED DIAGNOSES   Final diagnoses:  SOB (shortness of breath)     Rx / DC Orders   ED Discharge Orders     None        Note:  This document was prepared using Dragon voice recognition software and may include unintentional  dictation errors.   Brason Berthelot, Delice Bison, DO 07/24/22 0129

## 2022-07-24 NOTE — Discharge Instructions (Signed)
Please use your inhaler as prescribed by your primary care provider today.  Your work-up here has been reassuring with normal labs, chest x-ray and given your symptoms have resolved without intervention here and your oxygen is normal, I feel you are safe for discharge with close outpatient follow-up.  Please return to the emergency department if you begin having shortness of breath again at home or wheezing that does not improve with your inhaler.  If you have chest discomfort, tightness, blue lips or blue fingertips, vomiting that does not stop, please return to the ED.

## 2022-08-01 ENCOUNTER — Encounter: Payer: Self-pay | Admitting: Student in an Organized Health Care Education/Training Program

## 2022-08-01 ENCOUNTER — Ambulatory Visit: Payer: Medicaid Other | Admitting: Student in an Organized Health Care Education/Training Program

## 2022-08-01 ENCOUNTER — Other Ambulatory Visit: Payer: Self-pay | Admitting: Family Medicine

## 2022-08-01 VITALS — BP 180/100 | HR 74 | Temp 98.0°F | Ht 67.0 in | Wt 237.8 lb

## 2022-08-01 DIAGNOSIS — Z720 Tobacco use: Secondary | ICD-10-CM | POA: Diagnosis not present

## 2022-08-01 DIAGNOSIS — M1A09X0 Idiopathic chronic gout, multiple sites, without tophus (tophi): Secondary | ICD-10-CM

## 2022-08-01 DIAGNOSIS — Z9189 Other specified personal risk factors, not elsewhere classified: Secondary | ICD-10-CM | POA: Diagnosis not present

## 2022-08-01 DIAGNOSIS — R0602 Shortness of breath: Secondary | ICD-10-CM | POA: Diagnosis not present

## 2022-08-01 MED ORDER — NICOTINE POLACRILEX 2 MG MT LOZG: 2.0000 mg | LOZENGE | OROMUCOSAL | 3 refills | Status: DC | PRN

## 2022-08-01 MED ORDER — NICOTINE 21 MG/24HR TD PT24: 21.0000 mg | MEDICATED_PATCH | TRANSDERMAL | 0 refills | Status: DC

## 2022-08-01 MED ORDER — BUDESONIDE-FORMOTEROL FUMARATE 160-4.5 MCG/ACT IN AERO: 2.0000 | INHALATION_SPRAY | Freq: Two times a day (BID) | RESPIRATORY_TRACT | 12 refills | Status: DC

## 2022-08-01 MED ORDER — LEVOCETIRIZINE DIHYDROCHLORIDE 5 MG PO TABS: 5.0000 mg | ORAL_TABLET | Freq: Every day | ORAL | 11 refills | Status: DC

## 2022-08-01 MED ORDER — NICOTINE 7 MG/24HR TD PT24: 7.0000 mg | MEDICATED_PATCH | TRANSDERMAL | 0 refills | Status: AC

## 2022-08-01 MED ORDER — FLUTICASONE PROPIONATE 50 MCG/ACT NA SUSP: 1.0000 | Freq: Every day | NASAL | 6 refills | Status: DC

## 2022-08-01 MED ORDER — NICOTINE 14 MG/24HR TD PT24: 14.0000 mg | MEDICATED_PATCH | TRANSDERMAL | 0 refills | Status: AC

## 2022-08-01 NOTE — Progress Notes (Signed)
Synopsis: Referred in for shortness of breath by Delsa Grana, PA-C  Assessment & Plan:   #Shortness of breath #Asthma #Tracheobronchomalacia #OSA  Cory Rivas has a history of childhood asthma and is presenting for episodic shortness of breath, wheezing, and cough. His symptoms seem to have somewhat improved with the initiation of Symbicort as needed. He also continues to smoke, and is currently smoking 1 pack per day.  My differential for Cory Rivas's symptoms is broad. He has a history of childhood asthma and could have been having asthma exacerbations. He reports some seasonal allergies and I will obtain a CBC with differential (assess for eosinophils) and allergen panel to work him up for type two inflammation. I will also obtain a pulmonary function test with pre and post albuterol spirometry, lung volumes and DLCO. Other obstructive diseases on the differential include COPD as well as a COPD/Asthma overlap syndrome. Furthermore, review of his chest CT is notable for significant narrowing of the left mainstem bronchus and is suggestive of tracheobronchomalacia. This could be contributing to his symptoms as it could mimic asthma. Given the high possibility of asthma, I will focus my evaluation on workup and management of his obstructive lung disease prior to initiating workup for TBM. I will be obtaining a sleep study and should he qualify (likely given his symptoms of snoring), the CPAP would help with the TBM. I will consider a referral to a center with IP for possible rigid bronchoscopy with silicone Y-stent placement for assessment should he not have any improvement of symptoms in the future. For management, I will initiate Symbicort twice daily in addition to PRN. I will also prescribe Flonase and a second generation anti-histamine given his inflamed and erythematous turbinates.  - Split night study; Future - budesonide-formoterol (SYMBICORT) 160-4.5 MCG/ACT inhaler; Inhale 2 puffs into the  lungs 2 (two) times daily.  Dispense: 1 each; Refill: 12 - levocetirizine (XYZAL) 5 MG tablet; Take 1 tablet (5 mg total) by mouth daily.  Dispense: 30 tablet; Refill: 11 - fluticasone (FLONASE) 50 MCG/ACT nasal spray; Place 1 spray into both nostrils daily.  Dispense: 18.2 mL; Refill: 6 - Pulmonary Function Test [facility] Only; Future  #Tobacco use  Long standing history of smoking and he is interested in quitting. Will prescribe nicotine patches and lozenges to aid with smoking cessation.  - nicotine polacrilex (NICOTINE MINI) 2 MG lozenge; Take 1 lozenge (2 mg total) by mouth every 2 (two) hours as needed for smoking cessation.  Dispense: 72 lozenge; Refill: 3 - nicotine (NICODERM CQ - DOSED IN MG/24 HR) 7 mg/24hr patch; Place 1 patch (7 mg total) onto the skin daily for 14 days.  Dispense: 14 patch; Refill: 0 - nicotine (NICODERM CQ - DOSED IN MG/24 HOURS) 21 mg/24hr patch; Place 1 patch (21 mg total) onto the skin daily.  Dispense: 42 patch; Refill: 0 - nicotine (NICODERM CQ - DOSED IN MG/24 HOURS) 14 mg/24hr patch; Place 1 patch (14 mg total) onto the skin daily for 14 days.  Dispense: 14 patch; Refill: 0   Return in about 2 months (around 10/01/2022).  I spent 60 minutes caring for this patient today, including preparing to see the patient, obtaining and/or reviewing separately obtained history, performing a medically appropriate examination and/or evaluation, counseling and educating the patient/family/caregiver, ordering medications, tests, or procedures, documenting clinical information in the electronic health record, and independently interpreting results (not separately reported/billed) and communicating results to the patient/family/caregiver  Cory Reichert, MD [facility] 08/01/2022 10:22  AM    End of visit medications:  Meds ordered this encounter  Medications   budesonide-formoterol (SYMBICORT) 160-4.5 MCG/ACT inhaler    Sig: Inhale 2 puffs into the lungs  2 (two) times daily.    Dispense:  1 each    Refill:  12   levocetirizine (XYZAL) 5 MG tablet    Sig: Take 1 tablet (5 mg total) by mouth daily.    Dispense:  30 tablet    Refill:  11   fluticasone (FLONASE) 50 MCG/ACT nasal spray    Sig: Place 1 spray into both nostrils daily.    Dispense:  18.2 mL    Refill:  6   nicotine polacrilex (NICOTINE MINI) 2 MG lozenge    Sig: Take 1 lozenge (2 mg total) by mouth every 2 (two) hours as needed for smoking cessation.    Dispense:  72 lozenge    Refill:  3   nicotine (NICODERM CQ - DOSED IN MG/24 HR) 7 mg/24hr patch    Sig: Place 1 patch (7 mg total) onto the skin daily for 14 days.    Dispense:  14 patch    Refill:  0   nicotine (NICODERM CQ - DOSED IN MG/24 HOURS) 21 mg/24hr patch    Sig: Place 1 patch (21 mg total) onto the skin daily.    Dispense:  42 patch    Refill:  0   nicotine (NICODERM CQ - DOSED IN MG/24 HOURS) 14 mg/24hr patch    Sig: Place 1 patch (14 mg total) onto the skin daily for 14 days.    Dispense:  14 patch    Refill:  0     Current Outpatient Medications:    acetaminophen (TYLENOL) 325 MG tablet, SMARTSIG:3 Tablet(s) By Mouth Every 8 Hours PRN, Disp: , Rfl:    allopurinol (ZYLOPRIM) 100 MG tablet, Take 1 tablet (100 mg total) by mouth daily. Take with 300 mg tab (for total of 400 mg once daily), Disp: 90 tablet, Rfl: 3   allopurinol (ZYLOPRIM) 300 MG tablet, Take one 300 mg tab po daily WITH 100 mg tab for total of 400 mg once daily for gout, Disp: 90 tablet, Rfl: 3   amLODipine (NORVASC) 10 MG tablet, Take 1 tablet (10 mg total) by mouth daily., Disp: 90 tablet, Rfl: 3   Baclofen 5 MG TABS, Take 5-10 mg by mouth 3 (three) times daily as needed (msk pain or spasms)., Disp: 90 tablet, Rfl: 3   benzonatate (TESSALON PERLES) 100 MG capsule, Take 1 capsule (100 mg total) by mouth 3 (three) times daily as needed for cough., Disp: 30 capsule, Rfl: 0   budesonide-formoterol (SYMBICORT) 160-4.5 MCG/ACT inhaler, Inhale 2  puffs into the lungs 2 (two) times daily. And can use 2 puffs prn up to every 6 hours for wheeze, SOB, Disp: 1 each, Rfl: 3   budesonide-formoterol (SYMBICORT) 160-4.5 MCG/ACT inhaler, Inhale 2 puffs into the lungs 2 (two) times daily., Disp: 1 each, Rfl: 12   Continuous Blood Gluc Transmit (DEXCOM G6 TRANSMITTER) MISC, Use 1 each every 3 (three) months, Disp: , Rfl:    Dulaglutide 1.5 MG/0.5ML SOPN, Inject into the skin., Disp: , Rfl:    DULoxetine (CYMBALTA) 30 MG capsule, Take 1 capsule (30 mg total) by mouth 2 (two) times daily., Disp: 180 capsule, Rfl: 1   ezetimibe (ZETIA) 10 MG tablet, Take 1 tablet (10 mg total) by mouth daily., Disp: 90 tablet, Rfl: 3   fluticasone (FLONASE) 50 MCG/ACT nasal spray, Place  1 spray into both nostrils daily., Disp: 18.2 mL, Rfl: 6   hydrALAZINE (APRESOLINE) 50 MG tablet, Take 1 tablet (50 mg total) by mouth 3 (three) times daily. Monitor BP, Disp: 90 tablet, Rfl: 1   hydrOXYzine (VISTARIL) 25 MG capsule, Take 1 capsule (25 mg total) by mouth every 8 (eight) hours as needed for itching., Disp: 30 capsule, Rfl: 1   indomethacin (INDOCIN) 25 MG capsule, TAKE 2 TABLETS (50 MG) BY 3 TIMES DAILY FOR 2 DAYS AT ONSET OF GOUT FLARE UP, THEN TAKE 1('25MG'$ )TABLET BY MOUTH FOR 3 DAYS, Disp: 20 capsule, Rfl: 3   insulin lispro (HUMALOG) 100 UNIT/ML KwikPen, Inject into the skin. Sliding scale up to 45 units per injection, Disp: , Rfl:    ipratropium-albuterol (DUONEB) 0.5-2.5 (3) MG/3ML SOLN, Take 3 mLs by nebulization every 6 (six) hours as needed (SOB wheeze cough)., Disp: 180 mL, Rfl: 1   levocetirizine (XYZAL) 5 MG tablet, Take 1 tablet (5 mg total) by mouth daily., Disp: 30 tablet, Rfl: 11   lisinopril (ZESTRIL) 40 MG tablet, Take 1 tablet (40 mg total) by mouth daily., Disp: 90 tablet, Rfl: 3   metFORMIN (GLUCOPHAGE) 1000 MG tablet, TAKE 1 TABLET BY MOUTH TWICE A DAY WITH A MEAL, Disp: 180 tablet, Rfl: 3   nicotine (NICODERM CQ - DOSED IN MG/24 HOURS) 14 mg/24hr patch,  Place 1 patch (14 mg total) onto the skin daily for 14 days., Disp: 14 patch, Rfl: 0   nicotine (NICODERM CQ - DOSED IN MG/24 HOURS) 21 mg/24hr patch, Place 1 patch (21 mg total) onto the skin daily., Disp: 42 patch, Rfl: 0   nicotine (NICODERM CQ - DOSED IN MG/24 HR) 7 mg/24hr patch, Place 1 patch (7 mg total) onto the skin daily for 14 days., Disp: 14 patch, Rfl: 0   nicotine polacrilex (NICOTINE MINI) 2 MG lozenge, Take 1 lozenge (2 mg total) by mouth every 2 (two) hours as needed for smoking cessation., Disp: 72 lozenge, Rfl: 3   Olopatadine HCl 0.2 % SOLN, Apply 1 drop to eye 2 (two) times daily as needed (eye allergies)., Disp: 2.5 mL, Rfl: 3   pregabalin (LYRICA) 25 MG capsule, TAKE 1 CAPSULE BY MOUTH IN THE MORNING AND 1 TO 3 CAPSULES EVERY NIGHT FOR CERVICAL RADICULOPATHY AND NEUROPATHY, Disp: 270 capsule, Rfl: 1   rosuvastatin (CRESTOR) 40 MG tablet, Take 1 tablet (40 mg total) by mouth daily., Disp: 90 tablet, Rfl: 3   triamcinolone ointment (KENALOG) 0.5 %, Apply 1 application. topically 2 (two) times daily., Disp: 30 g, Rfl: 0   TRULICITY 3 ZO/1.0RU SOPN, Inject into the skin., Disp: , Rfl:    VENTOLIN HFA 108 (90 Base) MCG/ACT inhaler, INHALE 2 PUFFS INTO THE LUNGS EVERY 6 HOURS AS NEEDED FOR WHEEZING OR SHORTNESS OF BREATH, Disp: 18 g, Rfl: 2   insulin glargine (LANTUS SOLOSTAR) 100 UNIT/ML Solostar Pen, Inject 20 Units into the skin at bedtime., Disp: , Rfl:    Subjective:   PATIENT ID: Cory Rivas GENDER: male DOB: 1965/12/26, MRN: [id number]  Chief Complaint  Patient presents with   pulmonary consult    SOB with exertion, prod cough with yellow mucus and wheezing.     HPI  Cory Rivas is a pleasant 56 year old male patient presenting to clinic for the evaluation of shortness of breath.  Patient reports symptoms of increased shortness of breath over the past year that is associated with wheezing, cough, and sputum production.  He reports that for the past year,  he started  noticing the symptoms that have progressed prompting him to present to the emergency department multiple times for an evaluation.  On presentation, he was worked up and ruled out for pulmonary embolism and was told he has bronchitis.  He was given prednisone and June and was recently started on Symbicort by his primary care provider.  Also notable is a prescription for Paxlovid in September 2023.  Today, his blood pressure is significantly elevated at 180/100, a problem for which she was referred to cardiology and is being actively managed by his primary care physician.  Today, he feels a little better but continues to have exertional dyspnea.  He tells me that he has a longstanding history of smoking having attempted to quit multiple times.  He had previously smoked 2 packs a day for over 35 years with at least 70-pack-year smoking history. He reports a history of childhood asthma that he outgrew in his teens. He has previously cut down with the help of Chantix to 1 pack a day but has not used it after he ran out of his prescription. He is interested in quitting smoking. He reports having gained some weight recently but is working on weight loss. He reports that the Symbicort as needed has helped with his symptoms. He tells me he snores at night and he has been having nasal congestion. He had previously taken an allergy pill but hasn't recently.  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 He had to undergo cervical fusion at [facility] after which he has been on disability.  Ancillary information including prior medications, full medical/surgical/family/social histories, and PFTs (when available) are listed below and have been reviewed.   Review of Systems  Constitutional:  Negative for weight loss.  Respiratory:  Positive for cough, sputum production, shortness of breath and wheezing. Negative for hemoptysis.   Cardiovascular:  Negative for chest pain.     Objective:    Vitals:   08/01/22 0944  BP: (!) 180/100  Pulse: 74  Temp: 98 F (36.7 C)  TempSrc: Temporal  SpO2: 98%  Weight: 237 lb 12.8 oz (107.9 kg)  Height: '5\' 7"'$  (1.702 m)   98% on RA  BMI Readings from Last 3 Encounters:  08/01/22 37.24 kg/m  07/23/22 37.29 kg/m  07/23/22 37.57 kg/m   Wt Readings from Last 3 Encounters:  08/01/22 237 lb 12.8 oz (107.9 kg)  07/23/22 238 lb 1.6 oz (108 kg)  07/23/22 239 lb 14.4 oz (108.8 kg)    Physical Exam Constitutional:      Appearance: He is obese. He is not ill-appearing.  HENT:     Nose: Congestion and rhinorrhea present.     Mouth/Throat:     Mouth: Mucous membranes are dry.  Eyes:     Pupils: Pupils are equal, round, and reactive to light.  Cardiovascular:     Rate and Rhythm: Normal rate and regular rhythm.     Heart sounds: Normal heart sounds.  Pulmonary:     Effort: Pulmonary effort is normal.     Breath sounds: Normal breath sounds. No wheezing, rhonchi or rales.  Abdominal:     General: There is distension.     Palpations: Abdomen is soft.  Musculoskeletal:        General: Normal range of motion.  Neurological:     General: No focal deficit present.     Mental Status: He is alert and oriented to person, place, and time. Mental status is at  baseline.       Ancillary Information    Past Medical History:  Diagnosis Date   Bulging of cervical intervertebral disc    Diabetes mellitus without complication (HCC)    Gout    High cholesterol    Hypertension    Stress due to family tension 04/15/2019     Family History  Problem Relation Age of Onset   Heart disease Mother    Diabetes Mother    Hyperlipidemia Mother    Hypertension Mother    Heart attack Mother    Lung cancer Father    Hypertension Father    Stroke Father    AAA (abdominal aortic aneurysm) Maternal Grandmother    Heart attack Maternal Grandmother    Diabetes Maternal Grandfather      Past Surgical History:  Procedure Laterality Date    CHOLECYSTECTOMY     COLONOSCOPY WITH PROPOFOL N/A 03/10/2020   Procedure: COLONOSCOPY WITH PROPOFOL;  Surgeon: Lucilla Lame, MD;  Location: [facility] ENDOSCOPY;  Service: Endoscopy;  Laterality: N/A;    Social History   Socioeconomic History   Marital status: Divorced    Spouse name: Not on file   Number of children: 0   Years of education: 9   Highest education level: 9th grade  Occupational History   Occupation: disability  Tobacco Use   Smoking status: Every Day    Packs/day: 1.50    Years: 37.00    Total pack years: 55.50    Types: Cigarettes    Start date: 10/19/1989   Smokeless tobacco: Never   Tobacco comments:    30+ years hx smoking 2 ppd, stopped smoking briefly in April 2022 for a surgery and then restarted smoking about 1/2 ppd last 4-5 months   Vaping Use   Vaping Use: Never used  Substance and Sexual Activity   Alcohol use: No   Drug use: No   Sexual activity: Not Currently    Partners: Female  Other Topics Concern   Not on file  Social History Narrative   Not on file   Social Determinants of Health   Financial Resource Strain: Medium Risk (07/30/2019)   Overall Financial Resource Strain (CARDIA)    Difficulty of Paying Living Expenses: Somewhat hard  Food Insecurity: Food Insecurity Present (07/30/2019)   Hunger Vital Sign    Worried About [place] in the Last Year: Sometimes true    Ran Out of Food in the Last Year: Sometimes true  Transportation Needs: No Transportation Needs (03/18/2019)   PRAPARE - Hydrologist (Medical): No    Lack of Transportation (Non-Medical): No  Physical Activity: Inactive (03/18/2019)   Exercise Vital Sign    Days of Exercise per Week: 0 days    Minutes of Exercise per Session: 0 min  Stress: Stress Concern Present (03/18/2019)   [place]    Feeling of Stress : To some extent  Social Connections: Unknown (07/30/2019)    Social Connection and Isolation Panel [NHANES]    Frequency of Communication with Friends and Family: Not on file    Frequency of Social Gatherings with Friends and Family: Never    Attends Religious Services: Not on file    Active Member of Clubs or Organizations: Not on file    Attends Archivist Meetings: Not on file    Marital Status: Not on file  Intimate Partner Violence: Not At Risk (03/18/2019)   Humiliation, Afraid,  Rape, and Kick questionnaire    Fear of Current or Ex-Partner: No    Emotionally Abused: No    Physically Abused: No    Sexually Abused: No     Allergies  Allergen Reactions   Oxycodone Nausea And Vomiting     CBC    Component Value Date/Time   WBC 9.2 07/23/2022 1506   RBC 4.97 07/23/2022 1506   HGB 14.0 07/23/2022 1506   HCT 42.3 07/23/2022 1506   PLT 206 07/23/2022 1506   MCV 85.1 07/23/2022 1506   MCH 28.2 07/23/2022 1506   MCHC 33.1 07/23/2022 1506   RDW 13.4 07/23/2022 1506   LYMPHSABS 0.8 06/30/2022 1234   MONOABS 0.5 06/30/2022 1234   EOSABS 0.1 06/30/2022 1234   BASOSABS 0.0 06/30/2022 1234    Pulmonary Functions Testing Results:     No data to display          Outpatient Medications Prior to Visit  Medication Sig Dispense Refill   acetaminophen (TYLENOL) 325 MG tablet SMARTSIG:3 Tablet(s) By Mouth Every 8 Hours PRN     allopurinol (ZYLOPRIM) 100 MG tablet Take 1 tablet (100 mg total) by mouth daily. Take with 300 mg tab (for total of 400 mg once daily) 90 tablet 3   allopurinol (ZYLOPRIM) 300 MG tablet Take one 300 mg tab po daily WITH 100 mg tab for total of 400 mg once daily for gout 90 tablet 3   amLODipine (NORVASC) 10 MG tablet Take 1 tablet (10 mg total) by mouth daily. 90 tablet 3   Baclofen 5 MG TABS Take 5-10 mg by mouth 3 (three) times daily as needed (msk pain or spasms). 90 tablet 3   benzonatate (TESSALON PERLES) 100 MG capsule Take 1 capsule (100 mg total) by mouth 3 (three) times daily as needed for cough. 30  capsule 0   budesonide-formoterol (SYMBICORT) 160-4.5 MCG/ACT inhaler Inhale 2 puffs into the lungs 2 (two) times daily. And can use 2 puffs prn up to every 6 hours for wheeze, SOB 1 each 3   Continuous Blood Gluc Transmit (DEXCOM G6 TRANSMITTER) MISC Use 1 each every 3 (three) months     Dulaglutide 1.5 MG/0.5ML SOPN Inject into the skin.     DULoxetine (CYMBALTA) 30 MG capsule Take 1 capsule (30 mg total) by mouth 2 (two) times daily. 180 capsule 1   ezetimibe (ZETIA) 10 MG tablet Take 1 tablet (10 mg total) by mouth daily. 90 tablet 3   hydrALAZINE (APRESOLINE) 50 MG tablet Take 1 tablet (50 mg total) by mouth 3 (three) times daily. Monitor BP 90 tablet 1   hydrOXYzine (VISTARIL) 25 MG capsule Take 1 capsule (25 mg total) by mouth every 8 (eight) hours as needed for itching. 30 capsule 1   indomethacin (INDOCIN) 25 MG capsule TAKE 2 TABLETS (50 MG) BY 3 TIMES DAILY FOR 2 DAYS AT ONSET OF GOUT FLARE UP, THEN TAKE 1('25MG'$ )TABLET BY MOUTH FOR 3 DAYS 20 capsule 3   insulin lispro (HUMALOG) 100 UNIT/ML KwikPen Inject into the skin. Sliding scale up to 45 units per injection     ipratropium-albuterol (DUONEB) 0.5-2.5 (3) MG/3ML SOLN Take 3 mLs by nebulization every 6 (six) hours as needed (SOB wheeze cough). 180 mL 1   lisinopril (ZESTRIL) 40 MG tablet Take 1 tablet (40 mg total) by mouth daily. 90 tablet 3   metFORMIN (GLUCOPHAGE) 1000 MG tablet TAKE 1 TABLET BY MOUTH TWICE A DAY WITH A MEAL 180 tablet 3   Olopatadine HCl  0.2 % SOLN Apply 1 drop to eye 2 (two) times daily as needed (eye allergies). 2.5 mL 3   pregabalin (LYRICA) 25 MG capsule TAKE 1 CAPSULE BY MOUTH IN THE MORNING AND 1 TO 3 CAPSULES EVERY NIGHT FOR CERVICAL RADICULOPATHY AND NEUROPATHY 270 capsule 1   rosuvastatin (CRESTOR) 40 MG tablet Take 1 tablet (40 mg total) by mouth daily. 90 tablet 3   triamcinolone ointment (KENALOG) 0.5 % Apply 1 application. topically 2 (two) times daily. 30 g 0   TRULICITY 3 MK/1.0ZX SOPN Inject into the  skin.     VENTOLIN HFA 108 (90 Base) MCG/ACT inhaler INHALE 2 PUFFS INTO THE LUNGS EVERY 6 HOURS AS NEEDED FOR WHEEZING OR SHORTNESS OF BREATH 18 g 2   levocetirizine (XYZAL) 5 MG tablet Take 1 tablet (5 mg total) by mouth every evening. 90 tablet 1   mometasone (NASONEX) 50 MCG/ACT nasal spray Place 2 sprays into the nose daily. 1 each 12   insulin glargine (LANTUS SOLOSTAR) 100 UNIT/ML Solostar Pen Inject 20 Units into the skin at bedtime.     No facility-administered medications prior to visit.

## 2022-08-01 NOTE — Patient Instructions (Addendum)
Today, I ordered blood work. You can get them draw at your preferred [facility] draw station. The nearest one to [facility] is at nearby [place] ([place], [place], [place] [address]).  The [facility] Quitline: Call 1-800-QUIT-NOW 615-622-1936). The [employer] Quitline is a free service for [employer]. Trained counselors are available from 8 am until 3 am, 365 days per year. Services are available in both [place] and [place].   Web Resources Free online support programs can help you track your progress and share experiences with others who are quitting. These are examples: www.becomeanex.org www.trytostop.org  www.smokefree.gov  www.[employer].com.br.aspx  Tobacco Cessation Medications  Nicotine Replacement Therapy (NRT)  Nicotine is the addictive part of tobacco smoke, but not the most dangerous part. There are 7000 other toxins in cigarettes, including carbon monoxide, that cause disease. People do not generally become addicted to medication. Common problems: People don't use enough medication or stop too early. Medications are safe and effective. Overdose is very uncommon. Use medications as long as needed (3 months minimum). Some combinations work better than single medications. Long acting medications like the NRT patch and bupropion provide continuous treatment for withdrawal symptoms.  PLUS  Short acting medications like the NRT gum, lozenge, inhaler, and nasal spray help people to cope with breakthrough cravings.  ? Nicotine Patch  Place patch on hairless skin on upper body, including arms and back. Each day: discard old patch, shower, apply new patch to a different site. Apply hydrocortisone cream to mildly red/irritated areas. Call provider if rash develops. If patch causes sleep disturbance, remove patch at bedtime and replace each morning after shower. Side effects may include: skin irritation, headache, insomnia, abnormal/vivid  dreams.  ? Nicotine Gum  Chew gum slowly, park in cheek when peppery taste or tingling sensation begins (about 15-30 chews). When taste or tingling goes away, begin chewing again. Use until nicotine is gone (taste or tingle does not return, usually 30 minutes). Park in different areas of mouth. Nicotine is absorbed through the lining of the mouth. Use enough to control cravings, up to 24 pieces per day (if used alone). Avoid eating or drinking for 15 minutes before using and during use. Side effects may include: mouth/jaw soreness, hiccups, indigestion, hypersalivation.  If gum is not chewed correctly, additional side effects may include lightheadedness, nausea/vomiting, throat and mouth irritation.  ? Nicotine Lozenge  Allow to dissolve slowly in mouth (20-30 minutes). Do not chew or swallow. Nicotine release may cause a warm tingling sensation. Occasionally rotate to different areas of the mouth. Use enough to control cravings, up to 20 lozenges per day (if used alone). Avoid eating or drinking for 15 minutes before using and during use. Side effects may include: nausea, hiccups, cough, heartburn, headache, gas, insomnia.  ? Nicotine Nasal Spray Use 1 spray in each nostril (1 dose) and tilt head back for 1 minute. Do not sniff, swallow, or inhale through nose.  Use at least 8 doses (1 spray in each nostril) , up to 40 doses per day (if used alone). To reduce nasal irritation, spray on cotton swab and insert into nose. Side effects may include: nasal and/or throat irritation (hot, peppery, or burning sensation), nasal irritation, tearing, sneezing, cough, headache.  ? Nicotine Oral Inhaler (puffer) Inhale into the back of the throat or puff in short breaths. Do not inhale into the lungs.  Puff continuously for 20 minutes (about 80 puffs) until cartridge is empty. Change cartridge when it loses the "burning in throat" sensation (  feels like air only). Open cartridges can be saved and  used again within 24 hours. Use at least 6 and up to 16 cartridges per day (if used alone).  Avoid eating or drinking for 15 minutes before using and during use. Side effects may include: mouth and/or throat irritation, unpleasant taste, cough, nasal irritation, indigestion, hiccups, headache.  ? Chantix (varenicline) Days 1-3: Take one 0.5 mg white pill each morning for 3 days, one week before quit date. Days 4-7: Increase to one 0.5 mg white pill twice a day in morning and evening for 4 days.  On Day 8 (target quit date), increase to one 1 mg blue pill twice a day. Maintain this dose for a minimum of 3 months. Take with food and a full glass of water to reduce nausea. Be sure that the two doses are at least 8 hours apart, but try to take second dose early in the evening (i.e. 6 pm) to avoid sleep problems. Common side effects include: nausea, insomnia, headache, abnormal/vivid dreams. Tell your doctor if you have any history of psychiatric illness prior to starting Chantix.  STOP taking CHANTIX and contact a healthcare provider immediately if you experience agitation, hostility, depressed mood, changes in thoughts or behavior that are not typical for you, thinking about or attempting suicide, allergic or skin reactions including swelling, rash, redness, or peeling of the skin.  For patients who have heart disease: Smoking is a major risk factor for cardiovascular disease, and Chantix can help you quit smoking. Chantix may be associated with a small, increased risk of certain heart events in patients who have heart disease. If you have any new or worsening symptoms of heart disease while taking Chantix, such as shortness of breath or trouble breathing, new or worsening chest pain, or new or worsening pain in your legs when walking, call your doctor or get emergency medical help immediately.  ? Wellbutrin / Zyban (bupropion) Take one 150 mg pill each morning for 3 days, one week before target quit  date. On Day 4, increase to one 150 mg pill twice a day, morning and evening.  Maintain this dose for a minimum of 3 months. Be sure that the two doses are at least 8 hours apart, but try to take second dose early in the evening (i.e. 6 pm) to avoid sleep problems. Avoid or minimize use of alcohol when taking this medication. Common side effects include: dry mouth, headache, insomnia, nausea, weight loss.  Risk of seizure is 10/998. STOP taking BUPROPION and contact a healthcare provider immediately if you experience agitation, hostility, depressed mood, changes in thoughts or behavior that are not typical for you, thinking about or attempting suicide, allergic or skin reactions including swelling, rash, redness, or peeling of the skin.

## 2022-08-01 NOTE — Telephone Encounter (Signed)
Requested Prescriptions  Pending Prescriptions Disp Refills  . allopurinol (ZYLOPRIM) 100 MG tablet [Pharmacy Med Name: ALLOPURINOL 100 MG TAB] 90 tablet 0    Sig: TAKE 1 TABLET BY MOUTH ONCE A DAY. TAKE WITH 300 MG TABLETS     Endocrinology:  Gout Agents - allopurinol Passed - 08/01/2022  2:55 PM      Passed - Uric Acid in normal range and within 360 days    Uric Acid, Serum  Date Value Ref Range Status  08/18/2021 7.3 4.0 - 8.0 mg/dL Final    Comment:    Therapeutic target for gout patients: <6.0 mg/dL .          Passed - Cr in normal range and within 360 days    Creat  Date Value Ref Range Status  06/27/2022 1.57 (H) 0.70 - 1.30 mg/dL Final   Creatinine, Ser  Date Value Ref Range Status  07/23/2022 1.15 0.61 - 1.24 mg/dL Final   Creatinine, Urine  Date Value Ref Range Status  06/27/2022 159 20 - 320 mg/dL Final         Passed - Valid encounter within last 12 months    Recent Outpatient Visits          1 week ago Essential hypertension   [facility] Delsa Grana, PA-C   3 weeks ago Essential hypertension   [facility] Hensley, Kristeen Miss, PA-C   1 month ago Rash and nonspecific skin eruption   [facility] Delsa Grana, PA-C   1 month ago Pruritus   [facility] Delsa Grana, PA-C   1 month ago Accelerated hypertension   [facility] Delsa Grana, PA-C      Future Appointments            In 6 days Agbor-Etang, Aaron Edelman, MD [place] [place]   In 1 month Dgayli, Berdine Addison, MD [facility] [place]           Passed - CBC within normal limits and completed in the last 12 months    WBC  Date Value Ref Range Status  07/23/2022 9.2 4.0 - 10.5 K/uL Final   RBC  Date Value Ref Range Status  07/23/2022 4.97 4.22 - 5.81 MIL/uL Final   Hemoglobin  Date Value Ref Range Status  07/23/2022 14.0 13.0 - 17.0 g/dL Final   HCT   Date Value Ref Range Status  07/23/2022 42.3 39.0 - 52.0 % Final   MCHC  Date Value Ref Range Status  07/23/2022 33.1 30.0 - 36.0 g/dL Final   [facility]  Date Value Ref Range Status  07/23/2022 28.2 26.0 - 34.0 pg Final   MCV  Date Value Ref Range Status  07/23/2022 85.1 80.0 - 100.0 fL Final   No results found for: "PLTCOUNTKUC", "LABPLAT", "POCPLA" RDW  Date Value Ref Range Status  07/23/2022 13.4 11.5 - 15.5 % Final

## 2022-08-03 LAB — ALLERGEN PANEL (27) + IGE
Alternaria Alternata IgE: <0.1 kU/L
Aspergillus Fumigatus IgE: <0.1 kU/L
Bahia Grass IgE: <0.1 kU/L
Bermuda Grass IgE: <0.1 kU/L
Cat Dander IgE: <0.1 kU/L
Cedar, Mountain IgE: <0.1 kU/L
Cladosporium Herbarum IgE: <0.1 kU/L
Cocklebur IgE: <0.1 kU/L
Cockroach, American IgE: <0.1 kU/L
Common Silver Birch IgE: 0.14 kU/L — AB
D Farinae IgE: <0.1 kU/L
D Pteronyssinus IgE: <0.1 kU/L
Dog Dander IgE: <0.1 kU/L
Elm, American IgE: <0.1 kU/L
Hickory, White IgE: 0.48 kU/L — AB
IgE (Immunoglobulin E), Serum: 42 IU/mL (ref 6–495)
Johnson Grass IgE: <0.1 kU/L
Kentucky Bluegrass IgE: <0.1 kU/L
Maple/Box Elder IgE: <0.1 kU/L
Mucor Racemosus IgE: <0.1 kU/L
Oak, White IgE: <0.1 kU/L
Penicillium Chrysogen IgE: <0.1 kU/L
Pigweed, Rough IgE: <0.1 kU/L
Plantain, English IgE: <0.1 kU/L
Ragweed, Short IgE: 0.1 kU/L — AB
Setomelanomma Rostrat: <0.1 kU/L
Timothy Grass IgE: <0.1 kU/L
White Mulberry IgE: <0.1 kU/L

## 2022-08-03 LAB — CBC WITH DIFFERENTIAL/PLATELET
Basophils Absolute: 0 10*3/uL (ref 0.0–0.2)
Basos: 1 %
EOS (ABSOLUTE): 0.1 10*3/uL (ref 0.0–0.4)
Eos: 2 %
Hematocrit: 42.1 % (ref 37.5–51.0)
Hemoglobin: 14.1 g/dL (ref 13.0–17.7)
Immature Grans (Abs): 0 10*3/uL (ref 0.0–0.1)
Immature Granulocytes: 0 %
Lymphocytes Absolute: 2.1 10*3/uL (ref 0.7–3.1)
Lymphs: 24 %
MCH: 28.3 pg (ref 26.6–33.0)
MCHC: 33.5 g/dL (ref 31.5–35.7)
MCV: 85 fL (ref 79–97)
Monocytes Absolute: 0.6 10*3/uL (ref 0.1–0.9)
Monocytes: 7 %
Neutrophils Absolute: 5.9 10*3/uL (ref 1.4–7.0)
Neutrophils: 66 %
Platelets: 221 10*3/uL (ref 150–450)
RBC: 4.98 x10E6/uL (ref 4.14–5.80)
RDW: 13.6 % (ref 11.6–15.4)
WBC: 8.9 10*3/uL (ref 3.4–10.8)

## 2022-08-07 ENCOUNTER — Encounter: Payer: Self-pay | Admitting: Cardiology

## 2022-08-07 ENCOUNTER — Ambulatory Visit: Payer: Medicaid Other | Attending: Cardiology | Admitting: Cardiology

## 2022-08-07 VITALS — BP 160/90 | HR 60 | Ht 66.0 in | Wt 236.0 lb

## 2022-08-07 DIAGNOSIS — I10 Essential (primary) hypertension: Secondary | ICD-10-CM

## 2022-08-07 DIAGNOSIS — F17200 Nicotine dependence, unspecified, uncomplicated: Secondary | ICD-10-CM | POA: Diagnosis not present

## 2022-08-07 DIAGNOSIS — E782 Mixed hyperlipidemia: Secondary | ICD-10-CM

## 2022-08-07 MED ORDER — HYDRALAZINE HCL 100 MG PO TABS: 100.0000 mg | ORAL_TABLET | Freq: Three times a day (TID) | ORAL | 1 refills | Status: DC

## 2022-08-07 NOTE — Progress Notes (Signed)
Cardiology Office Note:    Date:  08/07/2022   ID:  DELVECCHIO MADOLE, DOB 04/07/1966, MRN [id number]  PCP:  Delsa Grana, PA-C  [facility] Cardiologist:  None  [facility] Electrophysiologist:  None   Referring MD: Delsa Grana, PA-C   Chief Complaint  Patient presents with   Follow-up    1 year f/u, HTN (home 200s/110s)    History of Present Illness:    Cory Rivas is a 56 y.o. male with a hx of hypertension, diabetes, hyperlipidemia, smoker x35+ years who presents for follow-up.    Previously seen for preop evaluation and chest pain.  Work-up with echo and Myoview showed no significant abnormalities.  Blood pressures have been elevated of late, as high as 160s to 180s at home.  Currently smokes, is working on quitting.  Has a patch to help him with quitting.  Compliant with medications as prescribed.  States eating low-salt diet.  Denies chest pain.  Otherwise doing okay.   Prior notes Echo 10/2020 EF 60 to 65%, aortic sclerosis Lexiscan Myoview 09/2020 no significant ischemia, low risk study   Past Medical History:  Diagnosis Date   Bulging of cervical intervertebral disc    Diabetes mellitus without complication (HCC)    Gout    High cholesterol    Hypertension    Stress due to family tension 04/15/2019    Past Surgical History:  Procedure Laterality Date   CHOLECYSTECTOMY     COLONOSCOPY WITH PROPOFOL N/A 03/10/2020   Procedure: COLONOSCOPY WITH PROPOFOL;  Surgeon: Lucilla Lame, MD;  Location: [facility] ENDOSCOPY;  Service: Endoscopy;  Laterality: N/A;    Current Medications: Current Meds  Medication Sig   acetaminophen (TYLENOL) 325 MG tablet SMARTSIG:3 Tablet(s) By Mouth Every 8 Hours PRN   allopurinol (ZYLOPRIM) 100 MG tablet TAKE 1 TABLET BY MOUTH ONCE A DAY. TAKE WITH 300 MG TABLETS   allopurinol (ZYLOPRIM) 300 MG tablet Take one 300 mg tab po daily WITH 100 mg tab for total of 400 mg once daily for gout   amLODipine (NORVASC) 10 MG tablet Take 1 tablet  (10 mg total) by mouth daily.   Baclofen 5 MG TABS Take 5-10 mg by mouth 3 (three) times daily as needed (msk pain or spasms).   budesonide-formoterol (SYMBICORT) 160-4.5 MCG/ACT inhaler Inhale 2 puffs into the lungs 2 (two) times daily. And can use 2 puffs prn up to every 6 hours for wheeze, SOB   Continuous Blood Gluc Transmit (DEXCOM G6 TRANSMITTER) MISC Use 1 each every 3 (three) months   Dulaglutide 1.5 MG/0.5ML SOPN Inject into the skin.   DULoxetine (CYMBALTA) 30 MG capsule Take 1 capsule (30 mg total) by mouth 2 (two) times daily.   ezetimibe (ZETIA) 10 MG tablet Take 1 tablet (10 mg total) by mouth daily.   fluticasone (FLONASE) 50 MCG/ACT nasal spray Place 1 spray into both nostrils daily.   hydrOXYzine (VISTARIL) 25 MG capsule Take 1 capsule (25 mg total) by mouth every 8 (eight) hours as needed for itching.   indomethacin (INDOCIN) 25 MG capsule TAKE 2 TABLETS (50 MG) BY 3 TIMES DAILY FOR 2 DAYS AT ONSET OF GOUT FLARE UP, THEN TAKE 1('25MG'$ )TABLET BY MOUTH FOR 3 DAYS   insulin glargine (LANTUS SOLOSTAR) 100 UNIT/ML Solostar Pen Inject 20 Units into the skin at bedtime.   insulin lispro (HUMALOG) 100 UNIT/ML KwikPen Inject into the skin. Sliding scale up to 45 units per injection   ipratropium-albuterol (DUONEB) 0.5-2.5 (3) MG/3ML SOLN Take 3  mLs by nebulization every 6 (six) hours as needed (SOB wheeze cough).   levocetirizine (XYZAL) 5 MG tablet Take 1 tablet (5 mg total) by mouth daily.   lisinopril (ZESTRIL) 40 MG tablet Take 1 tablet (40 mg total) by mouth daily.   metFORMIN (GLUCOPHAGE) 1000 MG tablet TAKE 1 TABLET BY MOUTH TWICE A DAY WITH A MEAL   nicotine (NICODERM CQ - DOSED IN MG/24 HOURS) 21 mg/24hr patch Place 1 patch (21 mg total) onto the skin daily.   nicotine polacrilex (NICOTINE MINI) 2 MG lozenge Take 1 lozenge (2 mg total) by mouth every 2 (two) hours as needed for smoking cessation.   Olopatadine HCl 0.2 % SOLN Apply 1 drop to eye 2 (two) times daily as needed (eye  allergies).   pregabalin (LYRICA) 25 MG capsule TAKE 1 CAPSULE BY MOUTH IN THE MORNING AND 1 TO 3 CAPSULES EVERY NIGHT FOR CERVICAL RADICULOPATHY AND NEUROPATHY   rosuvastatin (CRESTOR) 40 MG tablet Take 1 tablet (40 mg total) by mouth daily.   triamcinolone ointment (KENALOG) 0.5 % Apply 1 application. topically 2 (two) times daily.   TRULICITY 3 OZ/3.0QM SOPN Inject into the skin.   VENTOLIN HFA 108 (90 Base) MCG/ACT inhaler INHALE 2 PUFFS INTO THE LUNGS EVERY 6 HOURS AS NEEDED FOR WHEEZING OR SHORTNESS OF BREATH   [DISCONTINUED] hydrALAZINE (APRESOLINE) 50 MG tablet Take 1 tablet (50 mg total) by mouth 3 (three) times daily. Monitor BP     Allergies:   Oxycodone   Social History   Socioeconomic History   Marital status: Divorced    Spouse name: Not on file   Number of children: 0   Years of education: 9   Highest education level: 9th grade  Occupational History   Occupation: disability  Tobacco Use   Smoking status: Every Day    Packs/day: 1.00    Years: 37.00    Total pack years: 37.00    Types: Cigarettes    Start date: 10/19/1989   Smokeless tobacco: Never   Tobacco comments:    30+ years hx smoking 2 ppd, stopped smoking briefly in April 2022 for a surgery and then restarted smoking about 1/2 ppd last 4-5 months   Vaping Use   Vaping Use: Never used  Substance and Sexual Activity   Alcohol use: No   Drug use: No   Sexual activity: Not Currently    Partners: Female  Other Topics Concern   Not on file  Social History Narrative   Not on file   Social Determinants of Health   Financial Resource Strain: Medium Risk (07/30/2019)   Overall Financial Resource Strain (CARDIA)    Difficulty of Paying Living Expenses: Somewhat hard  Food Insecurity: Food Insecurity Present (07/30/2019)   Hunger Vital Sign    Worried About [place] in the Last Year: Sometimes true    Ran Out of Food in the Last Year: Sometimes true  Transportation Needs: No Transportation Needs  (03/18/2019)   PRAPARE - Hydrologist (Medical): No    Lack of Transportation (Non-Medical): No  Physical Activity: Inactive (03/18/2019)   Exercise Vital Sign    Days of Exercise per Week: 0 days    Minutes of Exercise per Session: 0 min  Stress: Stress Concern Present (03/18/2019)   [place]    Feeling of Stress : To some extent  Social Connections: Unknown (07/30/2019)   Social Connection and Isolation Panel [  NHANES]    Frequency of Communication with Friends and Family: Not on file    Frequency of Social Gatherings with Friends and Family: Never    Attends Religious Services: Not on Advertising copywriter or Organizations: Not on file    Attends Archivist Meetings: Not on file    Marital Status: Not on file     Family History: The patient's family history includes AAA (abdominal aortic aneurysm) in his maternal grandmother; Diabetes in his maternal grandfather and mother; Heart attack in his maternal grandmother and mother; Heart disease in his mother; Hyperlipidemia in his mother; Hypertension in his father and mother; Lung cancer in his father; Stroke in his father.  ROS:   Please see the history of present illness.     All other systems reviewed and are negative.  EKGs/Labs/Other Studies Reviewed:    The following studies were reviewed today:   EKG:  EKG is  ordered today.  The ekg ordered today demonstrates normal sinus rhythm, heart rate 60  Recent Labs: 06/08/2022: B Natriuretic Peptide 161.5 07/23/2022: ALT 18; BUN 19; Creatinine, Ser 1.15; Potassium 3.7; Sodium 136 08/01/2022: Hemoglobin 14.1; Platelets 221  Recent Lipid Panel    Component Value Date/Time   CHOL 224 (H) 06/08/2022 1534   TRIG 266 (H) 06/08/2022 1534   HDL 36 (L) 06/08/2022 1534   CHOLHDL 6.2 06/08/2022 1534   VLDL 53 (H) 06/08/2022 1534   LDLCALC 135 (H) 06/08/2022 1534   LDLCALC 133  (H) 03/06/2022 1534   LDLDIRECT 135 (H) 08/18/2021 1438     Risk Assessment/Calculations:      Physical Exam:    VS:  BP (!) 160/90 (BP Location: Left Arm)   Pulse 60   Ht '5\' 6"'$  (1.676 m)   Wt 236 lb (107 kg)   SpO2 96%   BMI 38.09 kg/m     Wt Readings from Last 3 Encounters:  08/07/22 236 lb (107 kg)  08/01/22 237 lb 12.8 oz (107.9 kg)  07/23/22 238 lb 1.6 oz (108 kg)     GEN:  Well nourished, well developed in no acute distress HEENT: Normal NECK: No JVD; No carotid bruits CARDIAC: RRR, 2/6 systolic murmur loudest at left lower sternal border.  RESPIRATORY:  Clear to auscultation without rales, wheezing or rhonchi  ABDOMEN: Soft, non-tender, non-distended MUSCULOSKELETAL:  No edema; No deformity  SKIN: Warm and dry NEUROLOGIC:  Alert and oriented x 3 PSYCHIATRIC:  Normal affect   ASSESSMENT:    1. Primary hypertension   2. Mixed hyperlipidemia   3. Smoker   4. Accelerated hypertension    PLAN:    In order of problems listed above:  Hypertension, BP elevated, increase hydralazine to 100 mg 3 times daily.  Continue lisinopril 40, amlodipine 10 mg daily.  Consider adding HCTZ at follow-up visit if BP still elevated.  Has a previous history of mild AKI. Hyperlipidemia, continue Crestor 40 mg daily. Current smoker, smoking cessation advised.  Follow-up in 6 to 8 weeks.  Medication Adjustments/Labs and Tests Ordered: Current medicines are reviewed at length with the patient today.  Concerns regarding medicines are outlined above.  Orders Placed This Encounter  Procedures   EKG 12-Lead   Meds ordered this encounter  Medications   hydrALAZINE (APRESOLINE) 100 MG tablet    Sig: Take 1 tablet (100 mg total) by mouth 3 (three) times daily. Monitor BP    Dispense:  90 tablet    Refill:  1  Patient Instructions  Medication Instructions:   Your physician has recommended you make the following change in your medication:    INCREASE your Hydralazine to 100  MG Three times a day.  *If you need a refill on your cardiac medications before your next appointment, please call your pharmacy*   Follow-Up: At [facility], you and your health needs are our priority.  As part of our continuing mission to provide you with exceptional heart care, we have created designated Provider Care Teams.  These Care Teams include your primary Cardiologist (physician) and Advanced Practice Providers (APPs -  Physician Assistants and Nurse Practitioners) who all work together to provide you with the care you need, when you need it.  We recommend signing up for the patient portal called "MyChart".  Sign up information is provided on this After Visit Summary.  MyChart is used to connect with patients for Virtual Visits (Telemedicine).  Patients are able to view lab/test results, encounter notes, upcoming appointments, etc.  Non-urgent messages can be sent to your provider as well.   To learn more about what you can do with MyChart, go to [email or web address].    Your next appointment:   6 week(s)  The format for your next appointment:   In Person  Provider:   You may see Kate Sable, MD or one of the following Advanced Practice Providers on your designated Care Team:   Murray Hodgkins, NP Christell Faith, PA-C Cadence Kathlen Mody, PA-C Gerrie Nordmann, NP    Other Instructions   Important Information About Sugar         Signed, Kate Sable, MD  08/07/2022 10:35 AM    [place]

## 2022-08-07 NOTE — Patient Instructions (Signed)
Medication Instructions:   Your physician has recommended you make the following change in your medication:    INCREASE your Hydralazine to 100 MG Three times a day.  *If you need a refill on your cardiac medications before your next appointment, please call your pharmacy*   Follow-Up: At [facility], you and your health needs are our priority.  As part of our continuing mission to provide you with exceptional heart care, we have created designated Provider Care Teams.  These Care Teams include your primary Cardiologist (physician) and Advanced Practice Providers (APPs -  Physician Assistants and Nurse Practitioners) who all work together to provide you with the care you need, when you need it.  We recommend signing up for the patient portal called "MyChart".  Sign up information is provided on this After Visit Summary.  MyChart is used to connect with patients for Virtual Visits (Telemedicine).  Patients are able to view lab/test results, encounter notes, upcoming appointments, etc.  Non-urgent messages can be sent to your provider as well.   To learn more about what you can do with MyChart, go to [email or web address].    Your next appointment:   6 week(s)  The format for your next appointment:   In Person  Provider:   You may see Kate Sable, MD or one of the following Advanced Practice Providers on your designated Care Team:   Murray Hodgkins, NP Christell Faith, PA-C Cadence Kathlen Mody, PA-C Gerrie Nordmann, NP    Other Instructions   Important Information About Sugar

## 2022-08-09 ENCOUNTER — Ambulatory Visit (INDEPENDENT_AMBULATORY_CARE_PROVIDER_SITE_OTHER): Payer: Medicaid Other | Admitting: Podiatry

## 2022-08-09 ENCOUNTER — Encounter: Payer: Self-pay | Admitting: Podiatry

## 2022-08-09 DIAGNOSIS — E0843 Diabetes mellitus due to underlying condition with diabetic autonomic (poly)neuropathy: Secondary | ICD-10-CM

## 2022-08-09 DIAGNOSIS — N182 Chronic kidney disease, stage 2 (mild): Secondary | ICD-10-CM | POA: Diagnosis not present

## 2022-08-09 DIAGNOSIS — B351 Tinea unguium: Secondary | ICD-10-CM

## 2022-08-09 DIAGNOSIS — M79674 Pain in right toe(s): Secondary | ICD-10-CM

## 2022-08-09 DIAGNOSIS — M79675 Pain in left toe(s): Secondary | ICD-10-CM | POA: Diagnosis not present

## 2022-08-09 NOTE — Progress Notes (Signed)
This patient returns to my office for at risk foot care.  This patient requires this care by a professional since this patient will be at risk due to having diabetes and CKD.  This patient is unable to cut nails himself since the patient cannot reach his nails.These nails are painful walking and wearing shoes.  This patient presents for at risk foot care today.  General Appearance  Alert, conversant and in no acute stress.  Vascular  Dorsalis pedis and posterior tibial  pulses are palpable  bilaterally.  Capillary return is within normal limits  bilaterally. Temperature is within normal limits  bilaterally.  Neurologic  Senn-Weinstein monofilament wire test within normal limits  bilaterally. Muscle power within normal limits bilaterally.  Nails Thick disfigured discolored nails with subungual debris  from hallux to fifth toes bilaterally. No evidence of bacterial infection or drainage bilaterally.  Orthopedic  No limitations of motion  feet .  No crepitus or effusions noted.  No bony pathology or digital deformities noted.  Skin  normotropic skin with no porokeratosis noted bilaterally.  No signs of infections or ulcers noted.     Onychomycosis  Pain in right toes  Pain in left toes  Consent was obtained for treatment procedures.   Mechanical debridement of nails 1-5  bilaterally performed with a nail nipper.  Filed with dremel without incident.    Return office visit   3 months                   Told patient to return for periodic foot care and evaluation due to potential at risk complications.   Gardiner Barefoot DPM

## 2022-08-15 ENCOUNTER — Encounter: Payer: Self-pay | Admitting: Emergency Medicine

## 2022-08-15 ENCOUNTER — Telehealth: Payer: Self-pay | Admitting: Cardiology

## 2022-08-15 ENCOUNTER — Other Ambulatory Visit: Payer: Self-pay

## 2022-08-15 ENCOUNTER — Emergency Department: Payer: Medicaid Other

## 2022-08-15 ENCOUNTER — Emergency Department
Admission: EM | Admit: 2022-08-15 | Discharge: 2022-08-15 | Disposition: A | Discharge status: Home / Self Care | Payer: Medicaid Other | Attending: Emergency Medicine | Admitting: Emergency Medicine

## 2022-08-15 DIAGNOSIS — J449 Chronic obstructive pulmonary disease, unspecified: Secondary | ICD-10-CM | POA: Diagnosis not present

## 2022-08-15 DIAGNOSIS — J810 Acute pulmonary edema: Secondary | ICD-10-CM | POA: Insufficient documentation

## 2022-08-15 DIAGNOSIS — I10 Essential (primary) hypertension: Secondary | ICD-10-CM | POA: Insufficient documentation

## 2022-08-15 DIAGNOSIS — R0602 Shortness of breath: Secondary | ICD-10-CM | POA: Diagnosis not present

## 2022-08-15 DIAGNOSIS — Z20822 Contact with and (suspected) exposure to covid-19: Secondary | ICD-10-CM | POA: Diagnosis not present

## 2022-08-15 DIAGNOSIS — E119 Type 2 diabetes mellitus without complications: Secondary | ICD-10-CM | POA: Diagnosis not present

## 2022-08-15 DIAGNOSIS — R778 Other specified abnormalities of plasma proteins: Secondary | ICD-10-CM | POA: Diagnosis not present

## 2022-08-15 DIAGNOSIS — Z8616 Personal history of COVID-19: Secondary | ICD-10-CM | POA: Diagnosis not present

## 2022-08-15 DIAGNOSIS — R7989 Other specified abnormal findings of blood chemistry: Secondary | ICD-10-CM | POA: Insufficient documentation

## 2022-08-15 DIAGNOSIS — J811 Chronic pulmonary edema: Secondary | ICD-10-CM | POA: Diagnosis not present

## 2022-08-15 HISTORY — DX: Heart failure, unspecified: I50.9

## 2022-08-15 LAB — CBC WITH DIFFERENTIAL/PLATELET
Abs Immature Granulocytes: 0.03 10*3/uL (ref 0.00–0.07)
Basophils Absolute: 0 10*3/uL (ref 0.0–0.1)
Basophils Relative: 0 %
Eosinophils Absolute: 0.2 10*3/uL (ref 0.0–0.5)
Eosinophils Relative: 2 %
HCT: 41.8 % (ref 39.0–52.0)
Hemoglobin: 13.6 g/dL (ref 13.0–17.0)
Immature Granulocytes: 0 %
Lymphocytes Relative: 21 %
Lymphs Abs: 1.6 10*3/uL (ref 0.7–4.0)
MCH: 27.4 pg (ref 26.0–34.0)
MCHC: 32.5 g/dL (ref 30.0–36.0)
MCV: 84.1 fL (ref 80.0–100.0)
Monocytes Absolute: 0.5 10*3/uL (ref 0.1–1.0)
Monocytes Relative: 7 %
Neutro Abs: 5.3 10*3/uL (ref 1.7–7.7)
Neutrophils Relative %: 70 %
Platelets: 211 10*3/uL (ref 150–400)
RBC: 4.97 MIL/uL (ref 4.22–5.81)
RDW: 13.4 % (ref 11.5–15.5)
WBC: 7.7 10*3/uL (ref 4.0–10.5)
nRBC: 0 % (ref 0.0–0.2)

## 2022-08-15 LAB — COMPREHENSIVE METABOLIC PANEL
ALT: 15 U/L (ref 0–44)
AST: 19 U/L (ref 15–41)
Albumin: 3.7 g/dL (ref 3.5–5.0)
Alkaline Phosphatase: 64 U/L (ref 38–126)
Anion gap: 8 (ref 5–15)
BUN: 13 mg/dL (ref 6–20)
CO2: 25 mmol/L (ref 22–32)
Calcium: 8.5 mg/dL — ABNORMAL LOW (ref 8.9–10.3)
Chloride: 106 mmol/L (ref 98–111)
Creatinine, Ser: 1.24 mg/dL (ref 0.61–1.24)
GFR, Estimated: >60 mL/min (ref >60)
Glucose, Bld: 175 mg/dL — ABNORMAL HIGH (ref 70–99)
Potassium: 3.7 mmol/L (ref 3.5–5.1)
Sodium: 139 mmol/L (ref 135–145)
Total Bilirubin: 0.8 mg/dL (ref 0.3–1.2)
Total Protein: 7.3 g/dL (ref 6.5–8.1)

## 2022-08-15 LAB — TROPONIN I (HIGH SENSITIVITY)
Troponin I (High Sensitivity): 30 ng/L — ABNORMAL HIGH (ref <18)
Troponin I (High Sensitivity): 25 ng/L — ABNORMAL HIGH (ref <18)

## 2022-08-15 LAB — RESP PANEL BY RT-PCR (FLU A&B, COVID) ARPGX2
Influenza A by PCR: NEGATIVE
Influenza B by PCR: NEGATIVE
SARS Coronavirus 2 by RT PCR: NEGATIVE

## 2022-08-15 LAB — BRAIN NATRIURETIC PEPTIDE: B Natriuretic Peptide: 162.7 pg/mL — ABNORMAL HIGH (ref 0.0–100.0)

## 2022-08-15 MED ORDER — PREDNISONE 20 MG PO TABS
60.0000 mg | ORAL_TABLET | Freq: Once | ORAL | Status: AC
  Administered 2022-08-15: 60 mg via ORAL
  Filled 2022-08-15: qty 3

## 2022-08-15 MED ORDER — FUROSEMIDE 10 MG/ML IJ SOLN
40.0000 mg | Freq: Once | INTRAMUSCULAR | Status: AC
  Administered 2022-08-15: 40 mg via INTRAVENOUS
  Filled 2022-08-15: qty 4

## 2022-08-15 MED ORDER — IPRATROPIUM-ALBUTEROL 0.5-2.5 (3) MG/3ML IN SOLN
3.0000 mL | Freq: Once | RESPIRATORY_TRACT | Status: AC
  Administered 2022-08-15: 3 mL via RESPIRATORY_TRACT
  Filled 2022-08-15: qty 3

## 2022-08-15 MED ORDER — FUROSEMIDE 20 MG PO TABS: 20.0000 mg | ORAL_TABLET | Freq: Every day | ORAL | 0 refills | Status: DC

## 2022-08-15 NOTE — ED Notes (Signed)
Delay in d/c d/t other critical pts

## 2022-08-15 NOTE — ED Provider Notes (Signed)
[facility] Provider Note    Event Date/Time   First MD Initiated Contact with Patient 08/15/22 0700     (approximate)   History   Shortness of Breath   HPI  Cory Rivas is a 56 y.o. male with a history of COPD, hypertension, diabetes, and hyperlipidemia who presents with shortness of breath over the last 2 days, persistent course, associated with productive cough but not with fever or chest pain.  The patient states that the symptoms were not improved by his albuterol treatments at home.  He states that he had COVID about a month ago.  I reviewed the past medical records.  The patient was seen in the ED on 10/10 for shortness of breath and wheezing but symptoms resolved immediately and he was discharged home.  He was seen by cardiology on 10/24 for follow-up of hypertension and hyperlipidemia.   Physical Exam   Triage Vital Signs: ED Triage Vitals  Enc Vitals Group     BP 08/15/22 0650 (!) 187/114     Pulse Rate 08/15/22 0650 83     Resp 08/15/22 0650 (!) 32     Temp 08/15/22 0650 97.8 F (36.6 C)     Temp Source 08/15/22 0650 Oral     SpO2 08/15/22 0650 94 %     Weight 08/15/22 0641 236 lb (107 kg)     Height 08/15/22 0641 '5\' 6"'$  (1.676 m)     Head Circumference --      Peak Flow --      Pain Score 08/15/22 0640 0     Pain Loc --      Pain Edu? --      Excl. in [place]? --     Most recent vital signs: Vitals:   08/15/22 1130 08/15/22 1158  BP: (!) 164/95   Pulse: 70   Resp: (!) 24   Temp:  98.7 F (37.1 C)  SpO2: 94%      General: Awake, no distress.  CV:  Good peripheral perfusion.  Normal heart sounds. Resp:  Increased effort.  Diminished breath sounds bilaterally with some scattered wheezing. Abd:  No distention.  Other:  No peripheral edema.   ED Results / Procedures / Treatments   Labs (all labs ordered are listed, but only abnormal results are displayed) Labs Reviewed  COMPREHENSIVE METABOLIC PANEL - Abnormal; Notable  for the following components:      Result Value   Glucose, Bld 175 (*)    Calcium 8.5 (*)    All other components within normal limits  BRAIN NATRIURETIC PEPTIDE - Abnormal; Notable for the following components:   B Natriuretic Peptide 162.7 (*)    All other components within normal limits  TROPONIN I (HIGH SENSITIVITY) - Abnormal; Notable for the following components:   Troponin I (High Sensitivity) 30 (*)    All other components within normal limits  TROPONIN I (HIGH SENSITIVITY) - Abnormal; Notable for the following components:   Troponin I (High Sensitivity) 25 (*)    All other components within normal limits  RESP PANEL BY RT-PCR (FLU A&B, COVID) ARPGX2  CBC WITH DIFFERENTIAL/PLATELET     EKG  ED ECG REPORT I, Arta Silence, the attending physician, personally viewed and interpreted this ECG.  Date: 08/15/2022 EKG Time: 0653 Rate: 73 Rhythm: normal sinus rhythm QRS Axis: normal Intervals: normal ST/T Wave abnormalities: Nonspecific T wave abnormalities laterally Narrative Interpretation: no evidence of acute ischemia; no significant change when compared to EKG of  08/07/2022    RADIOLOGY  Chest x-ray: I independently viewed and interpreted the images; there are bilateral interstitial opacities concerning for edema  PROCEDURES:  Critical Care performed: No  Procedures   MEDICATIONS ORDERED IN ED: Medications  ipratropium-albuterol (DUONEB) 0.5-2.5 (3) MG/3ML nebulizer solution 3 mL (3 mLs Nebulization Given 08/15/22 0737)  ipratropium-albuterol (DUONEB) 0.5-2.5 (3) MG/3ML nebulizer solution 3 mL (3 mLs Nebulization Given 08/15/22 0737)  predniSONE (DELTASONE) tablet 60 mg (60 mg Oral Given 08/15/22 0737)  furosemide (LASIX) injection 40 mg (40 mg Intravenous Given 08/15/22 0850)     IMPRESSION / MDM / ASSESSMENT AND PLAN / ED COURSE  I reviewed the triage vital signs and the nursing notes.  56 year old male with PMH as noted above presents with worsening  shortness of breath, wheezing, and cough over the last few days.  Exam reveals hypertension and some diminished breath sounds and wheezing.  Differential diagnosis includes, but is not limited to, COPD exacerbation, acute bronchitis, pneumonia, less likely new onset CHF or other cardiac cause.  We will give bronchodilators, steroid, obtain chest x-ray, lab work-up including BNP to rule out acute CHF, and reassess.  Patient's presentation is most consistent with acute presentation with potential threat to life or bodily function.  The patient is on the cardiac monitor to evaluate for evidence of arrhythmia and/or significant heart rate changes.  ----------------------------------------- 3:07 PM on 08/15/2022 -----------------------------------------  Chest x-ray shows findings concerning for pulmonary edema which would be a new finding for the patient.  However BNP is minimally elevated.  Troponins are also minimally elevated but there was no change from the first to the second and the levels consistent with the patient's baseline.  Respiratory panel is negative.  There is no leukocytosis.  I ordered IV Lasix and the patient has experienced significant relief in his shortness of breath, so I suspect that the pulmonary edema is the primary etiology of his symptoms.  However given that the patient has experience significant relief and is not requiring oxygen, I feel he is reasonable for discharge home with close outpatient follow-up.  Patient himself feels well and would like to go home.  I consulted his cardiologist Dr. Garen Lah who saw him last week.  He recommends sending the patient home on 20 mg of Lasix daily and he will arrange for follow-up.  I counseled the patient on the results of the work-up and the plan of care.  I gave him strict return precautions and he expressed understanding.  FINAL CLINICAL IMPRESSION(S) / ED DIAGNOSES   Final diagnoses:  Acute pulmonary edema ([place])      Rx / DC Orders   ED Discharge Orders          Ordered    furosemide (LASIX) 20 MG tablet  Daily        08/15/22 1057             Note:  This document was prepared using Dragon voice recognition software and may include unintentional dictation errors.    Arta Silence, MD 08/15/22 [phone]

## 2022-08-15 NOTE — ED Triage Notes (Addendum)
Patient ambulatory to triage with steady gait, without difficulty; audible wheezing and wet cough noted, diff speaking in full sentences; st last few days having symptoms with [facility]

## 2022-08-15 NOTE — Discharge Instructions (Signed)
Take the Lasix as prescribed.  Your cardiologist Dr. Garen Lah is aware that you were here in the ER and you should follow-up with him.  Return to the ER for new, worsening, or persistent severe shortness of breath, weakness or lightheadedness, chest pain, cough, fever, or any other new or worsening symptoms that concern you.

## 2022-08-15 NOTE — Telephone Encounter (Signed)
LMOV   Agbor-Etang, Aaron Edelman, MD  P Cv Div [place] Scheduling Please schedule outpatient f/u. Patient currently in the ED.   Ty  BA

## 2022-08-16 NOTE — Patient Outreach (Signed)
Care Coordination  08/16/2022  Cory Rivas 1966/02/03 [id number]  Transition Care Management Unsuccessful Follow-up Telephone Call  Date of discharge and from where:  08/15/22 [facility]   Attempts:  1st Attempt  Reason for unsuccessful TCM follow-up call:  Left voice message  Mickel Fuchs, BSW, [place] Medicaid Team  [phone]

## 2022-08-23 ENCOUNTER — Encounter: Payer: Self-pay | Admitting: Emergency Medicine

## 2022-08-23 ENCOUNTER — Other Ambulatory Visit: Payer: Self-pay

## 2022-08-23 ENCOUNTER — Inpatient Hospital Stay
Admission: EM | Admit: 2022-08-23 | Discharge: 2022-08-25 | DRG: 291 | Disposition: A | Discharge status: Home / Self Care | Payer: Medicaid Other | Attending: Obstetrics and Gynecology | Admitting: Obstetrics and Gynecology

## 2022-08-23 ENCOUNTER — Emergency Department: Payer: Medicaid Other

## 2022-08-23 ENCOUNTER — Observation Stay
Admit: 2022-08-23 | Discharge: 2022-08-23 | Disposition: A | Discharge status: Home / Self Care | Payer: Medicaid Other | Attending: Internal Medicine | Admitting: Internal Medicine

## 2022-08-23 ENCOUNTER — Other Ambulatory Visit: Payer: Self-pay | Admitting: Family Medicine

## 2022-08-23 DIAGNOSIS — I5031 Acute diastolic (congestive) heart failure: Secondary | ICD-10-CM | POA: Diagnosis not present

## 2022-08-23 DIAGNOSIS — I10 Essential (primary) hypertension: Secondary | ICD-10-CM | POA: Diagnosis present

## 2022-08-23 DIAGNOSIS — Z79899 Other long term (current) drug therapy: Secondary | ICD-10-CM

## 2022-08-23 DIAGNOSIS — I509 Heart failure, unspecified: Principal | ICD-10-CM

## 2022-08-23 DIAGNOSIS — Z720 Tobacco use: Secondary | ICD-10-CM | POA: Diagnosis present

## 2022-08-23 DIAGNOSIS — I5032 Chronic diastolic (congestive) heart failure: Secondary | ICD-10-CM

## 2022-08-23 DIAGNOSIS — Z885 Allergy status to narcotic agent status: Secondary | ICD-10-CM

## 2022-08-23 DIAGNOSIS — E1122 Type 2 diabetes mellitus with diabetic chronic kidney disease: Secondary | ICD-10-CM | POA: Diagnosis present

## 2022-08-23 DIAGNOSIS — Z8249 Family history of ischemic heart disease and other diseases of the circulatory system: Secondary | ICD-10-CM

## 2022-08-23 DIAGNOSIS — Z981 Arthrodesis status: Secondary | ICD-10-CM

## 2022-08-23 DIAGNOSIS — R0789 Other chest pain: Secondary | ICD-10-CM

## 2022-08-23 DIAGNOSIS — M5030 Other cervical disc degeneration, unspecified cervical region: Secondary | ICD-10-CM | POA: Diagnosis present

## 2022-08-23 DIAGNOSIS — I5033 Acute on chronic diastolic (congestive) heart failure: Secondary | ICD-10-CM | POA: Diagnosis present

## 2022-08-23 DIAGNOSIS — Z6839 Body mass index (BMI) 39.0-39.9, adult: Secondary | ICD-10-CM

## 2022-08-23 DIAGNOSIS — Z1152 Encounter for screening for COVID-19: Secondary | ICD-10-CM

## 2022-08-23 DIAGNOSIS — E66812 Obesity, class 2: Secondary | ICD-10-CM | POA: Diagnosis present

## 2022-08-23 DIAGNOSIS — J4489 Other specified chronic obstructive pulmonary disease: Secondary | ICD-10-CM | POA: Diagnosis present

## 2022-08-23 DIAGNOSIS — R0602 Shortness of breath: Secondary | ICD-10-CM | POA: Diagnosis not present

## 2022-08-23 DIAGNOSIS — M109 Gout, unspecified: Secondary | ICD-10-CM | POA: Diagnosis present

## 2022-08-23 DIAGNOSIS — E669 Obesity, unspecified: Secondary | ICD-10-CM | POA: Diagnosis present

## 2022-08-23 DIAGNOSIS — Z9109 Other allergy status, other than to drugs and biological substances: Secondary | ICD-10-CM

## 2022-08-23 DIAGNOSIS — Z7951 Long term (current) use of inhaled steroids: Secondary | ICD-10-CM

## 2022-08-23 DIAGNOSIS — I130 Hypertensive heart and chronic kidney disease with heart failure and stage 1 through stage 4 chronic kidney disease, or unspecified chronic kidney disease: Principal | ICD-10-CM | POA: Diagnosis present

## 2022-08-23 DIAGNOSIS — E1169 Type 2 diabetes mellitus with other specified complication: Secondary | ICD-10-CM | POA: Diagnosis present

## 2022-08-23 DIAGNOSIS — R059 Cough, unspecified: Secondary | ICD-10-CM | POA: Diagnosis not present

## 2022-08-23 DIAGNOSIS — Z794 Long term (current) use of insulin: Secondary | ICD-10-CM

## 2022-08-23 DIAGNOSIS — Z7984 Long term (current) use of oral hypoglycemic drugs: Secondary | ICD-10-CM

## 2022-08-23 DIAGNOSIS — N182 Chronic kidney disease, stage 2 (mild): Secondary | ICD-10-CM | POA: Diagnosis present

## 2022-08-23 DIAGNOSIS — Z7985 Long-term (current) use of injectable non-insulin antidiabetic drugs: Secondary | ICD-10-CM

## 2022-08-23 DIAGNOSIS — F17210 Nicotine dependence, cigarettes, uncomplicated: Secondary | ICD-10-CM | POA: Diagnosis present

## 2022-08-23 DIAGNOSIS — Z833 Family history of diabetes mellitus: Secondary | ICD-10-CM

## 2022-08-23 DIAGNOSIS — E1165 Type 2 diabetes mellitus with hyperglycemia: Secondary | ICD-10-CM | POA: Diagnosis present

## 2022-08-23 DIAGNOSIS — I110 Hypertensive heart disease with heart failure: Secondary | ICD-10-CM | POA: Diagnosis not present

## 2022-08-23 DIAGNOSIS — Z801 Family history of malignant neoplasm of trachea, bronchus and lung: Secondary | ICD-10-CM

## 2022-08-23 DIAGNOSIS — M1A09X0 Idiopathic chronic gout, multiple sites, without tophus (tophi): Secondary | ICD-10-CM

## 2022-08-23 DIAGNOSIS — J9601 Acute respiratory failure with hypoxia: Secondary | ICD-10-CM | POA: Diagnosis present

## 2022-08-23 DIAGNOSIS — M5136 Other intervertebral disc degeneration, lumbar region: Secondary | ICD-10-CM | POA: Diagnosis present

## 2022-08-23 DIAGNOSIS — Z83438 Family history of other disorder of lipoprotein metabolism and other lipidemia: Secondary | ICD-10-CM

## 2022-08-23 DIAGNOSIS — N179 Acute kidney failure, unspecified: Secondary | ICD-10-CM | POA: Diagnosis present

## 2022-08-23 DIAGNOSIS — Z823 Family history of stroke: Secondary | ICD-10-CM

## 2022-08-23 DIAGNOSIS — E7800 Pure hypercholesterolemia, unspecified: Secondary | ICD-10-CM | POA: Diagnosis present

## 2022-08-23 LAB — COMPREHENSIVE METABOLIC PANEL
ALT: 21 U/L (ref 0–44)
AST: 25 U/L (ref 15–41)
Albumin: 3.7 g/dL (ref 3.5–5.0)
Alkaline Phosphatase: 68 U/L (ref 38–126)
Anion gap: 12 (ref 5–15)
BUN: 14 mg/dL (ref 6–20)
CO2: 23 mmol/L (ref 22–32)
Calcium: 8.6 mg/dL — ABNORMAL LOW (ref 8.9–10.3)
Chloride: 104 mmol/L (ref 98–111)
Creatinine, Ser: 1.24 mg/dL (ref 0.61–1.24)
GFR, Estimated: >60 mL/min (ref >60)
Glucose, Bld: 198 mg/dL — ABNORMAL HIGH (ref 70–99)
Potassium: 3.7 mmol/L (ref 3.5–5.1)
Sodium: 139 mmol/L (ref 135–145)
Total Bilirubin: 1.2 mg/dL (ref 0.3–1.2)
Total Protein: 7.3 g/dL (ref 6.5–8.1)

## 2022-08-23 LAB — ECHOCARDIOGRAM COMPLETE
AR max vel: 1.78 cm2
AV Area VTI: 1.83 cm2
AV Area mean vel: 1.73 cm2
AV Mean grad: 8 mmHg
AV Peak grad: 14.5 mmHg
Ao pk vel: 1.9 m/s
Area-P 1/2: 6.37 cm2
Height: 67 in
S' Lateral: 3.5 cm
Weight: 3728 oz

## 2022-08-23 LAB — CBC WITH DIFFERENTIAL/PLATELET
Abs Immature Granulocytes: 0.04 10*3/uL (ref 0.00–0.07)
Basophils Absolute: 0 10*3/uL (ref 0.0–0.1)
Basophils Relative: 0 %
Eosinophils Absolute: 0.2 10*3/uL (ref 0.0–0.5)
Eosinophils Relative: 2 %
HCT: 42.6 % (ref 39.0–52.0)
Hemoglobin: 13.9 g/dL (ref 13.0–17.0)
Immature Granulocytes: 0 %
Lymphocytes Relative: 18 %
Lymphs Abs: 1.9 10*3/uL (ref 0.7–4.0)
MCH: 27.7 pg (ref 26.0–34.0)
MCHC: 32.6 g/dL (ref 30.0–36.0)
MCV: 85 fL (ref 80.0–100.0)
Monocytes Absolute: 0.6 10*3/uL (ref 0.1–1.0)
Monocytes Relative: 6 %
Neutro Abs: 8 10*3/uL — ABNORMAL HIGH (ref 1.7–7.7)
Neutrophils Relative %: 74 %
Platelets: 216 10*3/uL (ref 150–400)
RBC: 5.01 MIL/uL (ref 4.22–5.81)
RDW: 14.1 % (ref 11.5–15.5)
WBC: 10.7 10*3/uL — ABNORMAL HIGH (ref 4.0–10.5)
nRBC: 0 % (ref 0.0–0.2)

## 2022-08-23 LAB — CBG MONITORING, ED
Glucose-Capillary: 185 mg/dL — ABNORMAL HIGH (ref 70–99)
Glucose-Capillary: 146 mg/dL — ABNORMAL HIGH (ref 70–99)

## 2022-08-23 LAB — RESP PANEL BY RT-PCR (FLU A&B, COVID) ARPGX2
Influenza A by PCR: NEGATIVE
Influenza B by PCR: NEGATIVE
SARS Coronavirus 2 by RT PCR: NEGATIVE

## 2022-08-23 LAB — GLUCOSE, CAPILLARY: Glucose-Capillary: 134 mg/dL — ABNORMAL HIGH (ref 70–99)

## 2022-08-23 LAB — TROPONIN I (HIGH SENSITIVITY)
Troponin I (High Sensitivity): 53 ng/L — ABNORMAL HIGH (ref <18)
Troponin I (High Sensitivity): 41 ng/L — ABNORMAL HIGH (ref <18)

## 2022-08-23 LAB — BRAIN NATRIURETIC PEPTIDE: B Natriuretic Peptide: 164 pg/mL — ABNORMAL HIGH (ref 0.0–100.0)

## 2022-08-23 LAB — HEMOGLOBIN A1C
Hgb A1c MFr Bld: 7.5 % — ABNORMAL HIGH (ref 4.8–5.6)
Mean Plasma Glucose: 168.55 mg/dL

## 2022-08-23 MED ORDER — FUROSEMIDE 10 MG/ML IJ SOLN
40.0000 mg | Freq: Every day | INTRAMUSCULAR | Status: DC
  Filled 2022-08-23: qty 4

## 2022-08-23 MED ORDER — INSULIN ASPART 100 UNIT/ML IJ SOLN
0.0000 [IU] | Freq: Three times a day (TID) | INTRAMUSCULAR | Status: DC
  Administered 2022-08-23: 4 [IU] via SUBCUTANEOUS
  Administered 2022-08-23: 3 [IU] via SUBCUTANEOUS
  Administered 2022-08-24: 4 [IU] via SUBCUTANEOUS
  Administered 2022-08-24: 3 [IU] via SUBCUTANEOUS
  Administered 2022-08-25: 11 [IU] via SUBCUTANEOUS
  Administered 2022-08-25: 3 [IU] via SUBCUTANEOUS
  Filled 2022-08-23 (×6): qty 1

## 2022-08-23 MED ORDER — LISINOPRIL 20 MG PO TABS
40.0000 mg | ORAL_TABLET | Freq: Every day | ORAL | Status: DC
  Administered 2022-08-23 – 2022-08-24 (×2): 40 mg via ORAL
  Filled 2022-08-23: qty 2
  Filled 2022-08-23: qty 4

## 2022-08-23 MED ORDER — NITROGLYCERIN 0.4 MG SL SUBL: 0.4000 mg | SUBLINGUAL_TABLET | SUBLINGUAL | Status: DC | PRN

## 2022-08-23 MED ORDER — HYDRALAZINE HCL 50 MG PO TABS: 50.0000 mg | ORAL_TABLET | Freq: Once | ORAL | Status: DC

## 2022-08-23 MED ORDER — ASPIRIN 81 MG PO CHEW
324.0000 mg | CHEWABLE_TABLET | Freq: Once | ORAL | Status: AC
  Administered 2022-08-23: 324 mg via ORAL
  Filled 2022-08-23: qty 4

## 2022-08-23 MED ORDER — INSULIN GLARGINE-YFGN 100 UNIT/ML ~~LOC~~ SOLN
20.0000 [IU] | Freq: Every day | SUBCUTANEOUS | Status: DC
  Administered 2022-08-23 – 2022-08-24 (×2): 20 [IU] via SUBCUTANEOUS
  Filled 2022-08-23 (×3): qty 0.2

## 2022-08-23 MED ORDER — HYDRALAZINE HCL 50 MG PO TABS
100.0000 mg | ORAL_TABLET | Freq: Three times a day (TID) | ORAL | Status: DC
  Administered 2022-08-23 – 2022-08-25 (×6): 100 mg via ORAL
  Filled 2022-08-23 (×6): qty 2

## 2022-08-23 MED ORDER — FUROSEMIDE 10 MG/ML IJ SOLN
40.0000 mg | Freq: Once | INTRAMUSCULAR | Status: AC
  Administered 2022-08-23: 40 mg via INTRAVENOUS
  Filled 2022-08-23: qty 4

## 2022-08-23 MED ORDER — ROSUVASTATIN CALCIUM 10 MG PO TABS
40.0000 mg | ORAL_TABLET | Freq: Every evening | ORAL | Status: DC
  Administered 2022-08-23 – 2022-08-24 (×2): 40 mg via ORAL
  Filled 2022-08-23: qty 4
  Filled 2022-08-23 (×2): qty 2

## 2022-08-23 MED ORDER — EZETIMIBE 10 MG PO TABS
10.0000 mg | ORAL_TABLET | Freq: Every day | ORAL | Status: DC
  Administered 2022-08-23 – 2022-08-25 (×3): 10 mg via ORAL
  Filled 2022-08-23 (×3): qty 1

## 2022-08-23 MED ORDER — IPRATROPIUM-ALBUTEROL 0.5-2.5 (3) MG/3ML IN SOLN
3.0000 mL | Freq: Once | RESPIRATORY_TRACT | Status: AC
  Administered 2022-08-23: 3 mL via RESPIRATORY_TRACT
  Filled 2022-08-23: qty 3

## 2022-08-23 MED ORDER — ONDANSETRON HCL 4 MG/2ML IJ SOLN: 4.0000 mg | Freq: Four times a day (QID) | INTRAMUSCULAR | Status: DC | PRN

## 2022-08-23 MED ORDER — FUROSEMIDE 10 MG/ML IJ SOLN
40.0000 mg | Freq: Every day | INTRAMUSCULAR | Status: DC
  Administered 2022-08-24: 40 mg via INTRAVENOUS
  Filled 2022-08-23: qty 4

## 2022-08-23 MED ORDER — ACETAMINOPHEN 325 MG PO TABS
650.0000 mg | ORAL_TABLET | Freq: Four times a day (QID) | ORAL | Status: DC | PRN
  Administered 2022-08-24: 650 mg via ORAL
  Filled 2022-08-23: qty 2

## 2022-08-23 MED ORDER — FUROSEMIDE 10 MG/ML IJ SOLN: 40.0000 mg | Freq: Every morning | INTRAMUSCULAR | Status: DC

## 2022-08-23 MED ORDER — ONDANSETRON HCL 4 MG PO TABS: 4.0000 mg | ORAL_TABLET | Freq: Four times a day (QID) | ORAL | Status: DC | PRN

## 2022-08-23 MED ORDER — HYDROCOD POLI-CHLORPHE POLI ER 10-8 MG/5ML PO SUER
5.0000 mL | Freq: Once | ORAL | Status: AC
  Administered 2022-08-23: 5 mL via ORAL
  Filled 2022-08-23: qty 5

## 2022-08-23 MED ORDER — ACETAMINOPHEN 650 MG RE SUPP: 650.0000 mg | Freq: Four times a day (QID) | RECTAL | Status: DC | PRN

## 2022-08-23 MED ORDER — ENOXAPARIN SODIUM 60 MG/0.6ML IJ SOSY
0.5000 mg/kg | PREFILLED_SYRINGE | INTRAMUSCULAR | Status: DC
  Administered 2022-08-23 – 2022-08-24 (×2): 52.5 mg via SUBCUTANEOUS
  Filled 2022-08-23 (×2): qty 0.6

## 2022-08-23 MED ORDER — MOMETASONE FURO-FORMOTEROL FUM 200-5 MCG/ACT IN AERO
2.0000 | INHALATION_SPRAY | Freq: Two times a day (BID) | RESPIRATORY_TRACT | Status: DC
  Administered 2022-08-24 – 2022-08-25 (×3): 2 via RESPIRATORY_TRACT
  Filled 2022-08-23 (×2): qty 8.8

## 2022-08-23 MED ORDER — ALLOPURINOL 100 MG PO TABS
400.0000 mg | ORAL_TABLET | Freq: Every day | ORAL | Status: DC
  Administered 2022-08-24 – 2022-08-25 (×2): 400 mg via ORAL
  Filled 2022-08-23 (×2): qty 4

## 2022-08-23 MED ORDER — METFORMIN HCL 500 MG PO TABS
1000.0000 mg | ORAL_TABLET | Freq: Two times a day (BID) | ORAL | Status: DC
  Administered 2022-08-23 – 2022-08-24 (×2): 1000 mg via ORAL
  Filled 2022-08-23 (×2): qty 2

## 2022-08-23 NOTE — ED Notes (Signed)
Informed RN bed assigned 

## 2022-08-23 NOTE — Progress Notes (Signed)
*  PRELIMINARY RESULTS* Echocardiogram 2D Echocardiogram has been performed.  Cory Rivas 08/23/2022, 12:58 PM

## 2022-08-23 NOTE — ED Triage Notes (Signed)
Patient ambulatory to triage with steady gait, without difficulty or distress noted; pt reports SHOB and prod cough brown sputum x 3 days with chest "soreness" and dizziness

## 2022-08-23 NOTE — ED Provider Notes (Signed)
[facility] Provider Note    Event Date/Time   First MD Initiated Contact with Patient 08/23/22 [phone]     (approximate)   History   Shortness of Breath   HPI  Cory Rivas is a 56 y.o. male history of hypertension, hyperlipidemia, diabetes, CHF, obesity who presents to the emergency department with complaints of chest soreness, tightness, shortness of breath, productive cough.  No fevers or chills.  Does have some lower extremity swelling but noted pain.   History provided by patient.    Past Medical History:  Diagnosis Date   Bulging of cervical intervertebral disc    CHF (congestive heart failure) (HCC)    Diabetes mellitus without complication (HCC)    Gout    High cholesterol    Hypertension    Stress due to family tension 04/15/2019    Past Surgical History:  Procedure Laterality Date   CHOLECYSTECTOMY     COLONOSCOPY WITH PROPOFOL N/A 03/10/2020   Procedure: COLONOSCOPY WITH PROPOFOL;  Surgeon: Lucilla Lame, MD;  Location: [facility] ENDOSCOPY;  Service: Endoscopy;  Laterality: N/A;    MEDICATIONS:  Prior to Admission medications   Medication Sig Start Date End Date Taking? Authorizing Provider  acetaminophen (TYLENOL) 325 MG tablet SMARTSIG:3 Tablet(s) By Mouth Every 8 Hours PRN 02/07/21   [provider]  allopurinol (ZYLOPRIM) 100 MG tablet TAKE 1 TABLET BY MOUTH ONCE A DAY. TAKE WITH 300 MG TABLETS 08/01/22   Delsa Grana, PA-C  allopurinol (ZYLOPRIM) 300 MG tablet Take one 300 mg tab po daily WITH 100 mg tab for total of 400 mg once daily for gout 09/04/21   Delsa Grana, PA-C  amLODipine (NORVASC) 10 MG tablet Take 1 tablet (10 mg total) by mouth daily. 01/26/22   Delsa Grana, PA-C  Baclofen 5 MG TABS Take 5-10 mg by mouth 3 (three) times daily as needed (msk pain or spasms). Patient not taking: Reported on 08/15/2022 03/06/22   Delsa Grana, PA-C  benzonatate (TESSALON PERLES) 100 MG capsule Take 1 capsule (100 mg total) by  mouth 3 (three) times daily as needed for cough. 06/30/22 06/30/23  Vanessa Kenton Vale, MD  budesonide-formoterol (SYMBICORT) 160-4.5 MCG/ACT inhaler Inhale 2 puffs into the lungs 2 (two) times daily. And can use 2 puffs prn up to every 6 hours for wheeze, SOB 07/23/22   Delsa Grana, PA-C  budesonide-formoterol (SYMBICORT) 160-4.5 MCG/ACT inhaler Inhale 2 puffs into the lungs 2 (two) times daily. 08/01/22   Armando Reichert, MD  Dulaglutide 1.5 MG/0.5ML SOPN Inject into the skin. 12/16/20   [provider]  DULoxetine (CYMBALTA) 30 MG capsule Take 1 capsule (30 mg total) by mouth 2 (two) times daily. 03/06/22   Delsa Grana, PA-C  ezetimibe (ZETIA) 10 MG tablet Take 1 tablet (10 mg total) by mouth daily. 03/06/22   Delsa Grana, PA-C  fluticasone (FLONASE) 50 MCG/ACT nasal spray Place 1 spray into both nostrils daily. 08/01/22 08/01/23  Armando Reichert, MD  furosemide (LASIX) 20 MG tablet Take 1 tablet (20 mg total) by mouth daily. 08/15/22 09/14/22  Arta Silence, MD  hydrALAZINE (APRESOLINE) 100 MG tablet Take 1 tablet (100 mg total) by mouth 3 (three) times daily. Monitor BP 08/07/22   Kate Sable, MD  hydrOXYzine (VISTARIL) 25 MG capsule Take 1 capsule (25 mg total) by mouth every 8 (eight) hours as needed for itching. 06/21/22   Delsa Grana, PA-C  indomethacin (INDOCIN) 25 MG capsule TAKE 2 TABLETS (50 MG) BY 3 TIMES DAILY FOR 2  DAYS AT ONSET OF GOUT FLARE UP, THEN TAKE 1('25MG'$ )TABLET BY MOUTH FOR 3 DAYS 09/05/21   Delsa Grana, PA-C  insulin glargine (LANTUS SOLOSTAR) 100 UNIT/ML Solostar Pen Inject 20 Units into the skin at bedtime. 11/01/20 08/15/22  [provider]  insulin lispro (HUMALOG) 100 UNIT/ML KwikPen Inject into the skin. Sliding scale up to 45 units per injection 11/08/20   [provider]  ipratropium-albuterol (DUONEB) 0.5-2.5 (3) MG/3ML SOLN Take 3 mLs by nebulization every 6 (six) hours as needed (SOB wheeze cough). 07/23/22   Delsa Grana, PA-C  levocetirizine  (XYZAL) 5 MG tablet Take 1 tablet (5 mg total) by mouth daily. 08/01/22 08/01/23  Armando Reichert, MD  lisinopril (ZESTRIL) 40 MG tablet Take 1 tablet (40 mg total) by mouth daily. 03/06/22   Delsa Grana, PA-C  metFORMIN (GLUCOPHAGE) 1000 MG tablet TAKE 1 TABLET BY MOUTH TWICE A DAY WITH A MEAL 03/06/22   Delsa Grana, PA-C  nicotine (NICODERM CQ - DOSED IN MG/24 HOURS) 21 mg/24hr patch Place 1 patch (21 mg total) onto the skin daily. 08/01/22 09/12/22  Armando Reichert, MD  nicotine polacrilex (NICOTINE MINI) 2 MG lozenge Take 1 lozenge (2 mg total) by mouth every 2 (two) hours as needed for smoking cessation. 08/01/22 10/30/22  Armando Reichert, MD  Olopatadine HCl 0.2 % SOLN Apply 1 drop to eye 2 (two) times daily as needed (eye allergies). 01/29/22   Delsa Grana, PA-C  pregabalin (LYRICA) 25 MG capsule TAKE 1 CAPSULE BY MOUTH IN THE MORNING AND 1 TO 3 CAPSULES EVERY NIGHT FOR CERVICAL RADICULOPATHY AND NEUROPATHY Patient not taking: Reported on 08/15/2022 07/10/22   Delsa Grana, PA-C  rosuvastatin (CRESTOR) 40 MG tablet Take 1 tablet (40 mg total) by mouth daily. 01/26/22   Delsa Grana, PA-C  triamcinolone ointment (KENALOG) 0.5 % Apply 1 application. topically 2 (two) times daily. 01/11/22   Teodora Medici, DO  TRULICITY 3 UT/6.5YY SOPN Inject into the skin. 03/26/22   [provider]  VENTOLIN HFA 108 (90 Base) MCG/ACT inhaler INHALE 2 PUFFS INTO THE LUNGS EVERY 6 HOURS AS NEEDED FOR WHEEZING OR SHORTNESS OF BREATH 04/05/22   Delsa Grana, PA-C    Physical Exam   Triage Vital Signs: ED Triage Vitals  Enc Vitals Group     BP 08/23/22 0531 (!) 183/99     Pulse Rate 08/23/22 0531 77     Resp 08/23/22 0531 (!) 22     Temp 08/23/22 0531 98 F (36.7 C)     Temp Source 08/23/22 0531 Oral     SpO2 08/23/22 0531 93 %     Weight 08/23/22 0532 233 lb (105.7 kg)     Height 08/23/22 0532 '5\' 7"'$  (1.702 m)     Head Circumference --      Peak Flow --      Pain Score --      Pain Loc --       Pain Edu? --      Excl. in [place]? --     Most recent vital signs: Vitals:   08/23/22 0531  BP: (!) 183/99  Pulse: 77  Resp: (!) 22  Temp: 98 F (36.7 C)  SpO2: 93%    CONSTITUTIONAL: Alert and oriented and responds appropriately to questions.  Chronically ill-appearing, obese, appears uncomfortable HEAD: Normocephalic, atraumatic EYES: Conjunctivae clear, pupils appear equal, sclera nonicteric ENT: normal nose; moist mucous membranes NECK: Supple, normal ROM, difficult to appreciate any JVD due to patient's body habitus CARD: RRR; S1  and S2 appreciated; no murmurs, no clicks, no rubs, no gallops RESP: Patient is slightly tachypneic with some splinting.  Sats in the low 90s on room air.  Diminished aeration bilaterally without rhonchi, wheezing, rales.  No respiratory distress. ABD/GI: Normal bowel sounds; non-distended; soft, non-tender, no rebound, no guarding, no peritoneal signs BACK: The back appears normal EXT: Normal ROM in all joints; no deformity noted, bilateral lower extremity edema that is symmetric without calf tenderness or calf swelling SKIN: Normal color for age and race; warm; no rash on exposed skin NEURO: Moves all extremities equally, normal speech PSYCH: The patient's mood and manner are appropriate.   ED Results / Procedures / Treatments   LABS: (all labs ordered are listed, but only abnormal results are displayed) Labs Reviewed  CBC WITH DIFFERENTIAL/PLATELET - Abnormal; Notable for the following components:      Result Value   WBC 10.7 (*)    Neutro Abs 8.0 (*)    All other components within normal limits  COMPREHENSIVE METABOLIC PANEL - Abnormal; Notable for the following components:   Glucose, Bld 198 (*)    Calcium 8.6 (*)    All other components within normal limits  BRAIN NATRIURETIC PEPTIDE - Abnormal; Notable for the following components:   B Natriuretic Peptide 164.0 (*)    All other components within normal limits  TROPONIN I (HIGH  SENSITIVITY) - Abnormal; Notable for the following components:   Troponin I (High Sensitivity) 53 (*)    All other components within normal limits  RESP PANEL BY RT-PCR (FLU A&B, COVID) ARPGX2  TROPONIN I (HIGH SENSITIVITY)     EKG:  EKG Interpretation  Date/Time:  Thursday August 23 2022 05:34:57 EST Ventricular Rate:  75 PR Interval:  144 QRS Duration: 72 QT Interval:  366 QTC Calculation: 408 R Axis:   57 Text Interpretation: Normal sinus rhythm T wave abnormality, consider inferior ischemia Abnormal ECG No significant change since last tracing Confirmed by Pryor Curia [phone]) on 08/23/2022 5:53:01 AM         RADIOLOGY: My personal review and interpretation of imaging: Chest x-ray shows diffuse pulmonary edema.  I have personally reviewed all radiology reports.   DG Chest Portable 1 View  Result Date: 08/23/2022 CLINICAL DATA:  Shortness of breath and cough. EXAM: PORTABLE CHEST 1 VIEW COMPARISON:  08/15/2022 FINDINGS: Normal cardiomediastinal contours. Diffusely increased interstitial markings are identified bilaterally. Cephalized blood flow noted. No airspace consolidation. Cervicothoracic fusion IMPRESSION: Diffusely increased interstitial markings compatible with pulmonary edema. Electronically Signed   By: Kerby Moors M.D.   On: 08/23/2022 06:28     PROCEDURES:  Critical Care performed: No     .1-3 Lead EKG Interpretation  Performed by: Olita Takeshita, Delice Bison, DO Authorized by: Jaeden Messer, Delice Bison, DO     Interpretation: normal     ECG rate:  77   ECG rate assessment: normal     Rhythm: sinus rhythm     Ectopy: none     Conduction: normal       IMPRESSION / MDM / ASSESSMENT AND PLAN / ED COURSE  I reviewed the triage vital signs and the nursing notes.    Patient here with chest pain, shortness of breath, cough.  The patient is on the cardiac monitor to evaluate for evidence of arrhythmia and/or significant heart rate changes.   DIFFERENTIAL  DIAGNOSIS (includes but not limited to):   Viral URI, pneumonia, CHF, PE, ACS, doubt pneumothorax   Patient's presentation is most consistent with acute  presentation with potential threat to life or bodily function.   PLAN: We will obtain CBC, CMP, troponin x2, BNP, COVID and flu swabs, chest x-ray.  Will give DuoNeb for symptomatic relief although he is not currently wheezing.  Will give Tussionex for pain and cough suppression.   MEDICATIONS GIVEN IN ED: Medications  furosemide (LASIX) injection 40 mg (has no administration in time range)  aspirin chewable tablet 324 mg (has no administration in time range)  nitroGLYCERIN (NITROSTAT) SL tablet 0.4 mg (has no administration in time range)  hydrALAZINE (APRESOLINE) tablet 50 mg (has no administration in time range)  ipratropium-albuterol (DUONEB) 0.5-2.5 (3) MG/3ML nebulizer solution 3 mL (3 mLs Nebulization Given 08/23/22 0606)  chlorpheniramine-HYDROcodone (TUSSIONEX) 10-8 MG/5ML suspension 5 mL (5 mLs Oral Given 08/23/22 0606)     ED COURSE: Patient does have a slight leukocytosis of 10,000 but is afebrile.  Chest x-ray reviewed and interpreted by myself and the radiologist and shows pulmonary edema but no infiltrate, pneumothorax.  BNP is 164.  Troponin is elevated at 53 which is higher than he normally is.  EKG nonischemic.  Will give aspirin, nitroglycerin.  He is hypertensive today.  COVID and flu swabs are negative.  Suspect symptoms are more likely due to CHF exacerbation than infectious etiology.  Will give IV Lasix.  Have recommended admission for diuresis given splinting, tachypnea with pulmonary edema on x-ray and elevated troponin.   CONSULTS:  Consulted and discussed patient's case with hospitalist, Dr. Olevia Bowens.  I have recommended admission and consulting physician agrees and will place admission orders.  Patient (and family if present) agree with this plan.   I reviewed all nursing notes, vitals, pertinent previous records.  All  labs, EKGs, imaging ordered have been independently reviewed and interpreted by myself.    OUTSIDE RECORDS REVIEWED: Reviewed patient's last cardiology note on 08/07/2022 and last PCP note on 07/23/2022.       FINAL CLINICAL IMPRESSION(S) / ED DIAGNOSES   Final diagnoses:  Shortness of breath  Atypical chest pain  Acute on chronic congestive heart failure, unspecified heart failure type ([place])  Hypertension, unspecified type     Rx / DC Orders   ED Discharge Orders     None        Note:  This document was prepared using Dragon voice recognition software and may include unintentional dictation errors.   Kellar Westberg, Delice Bison, DO 08/23/22 [phone]

## 2022-08-23 NOTE — Telephone Encounter (Signed)
Refilled 08/01/2022 #[id number] rf. Requested Prescriptions  Pending Prescriptions Disp Refills   allopurinol (ZYLOPRIM) 100 MG tablet [Pharmacy Med Name: ALLOPURINOL 100 MG TAB] 90 tablet 0    Sig: TAKE 1 TABLET BY MOUTH ONCE A DAY. TAKE WITH 300 MG TABLETS     Endocrinology:  Gout Agents - allopurinol Failed - 08/23/2022  5:28 PM      Failed - Uric Acid in normal range and within 360 days    Uric Acid, Serum  Date Value Ref Range Status  08/18/2021 7.3 4.0 - 8.0 mg/dL Final    Comment:    Therapeutic target for gout patients: <6.0 mg/dL .          Failed - CBC within normal limits and completed in the last 12 months    WBC  Date Value Ref Range Status  08/23/2022 10.7 (H) 4.0 - 10.5 K/uL Final   RBC  Date Value Ref Range Status  08/23/2022 5.01 4.22 - 5.81 MIL/uL Final   Hemoglobin  Date Value Ref Range Status  08/23/2022 13.9 13.0 - 17.0 g/dL Final  08/01/2022 14.1 13.0 - 17.7 g/dL Final   HCT  Date Value Ref Range Status  08/23/2022 42.6 39.0 - 52.0 % Final   Hematocrit  Date Value Ref Range Status  08/01/2022 42.1 37.5 - 51.0 % Final   MCHC  Date Value Ref Range Status  08/23/2022 32.6 30.0 - 36.0 g/dL Final   [facility]  Date Value Ref Range Status  08/23/2022 27.7 26.0 - 34.0 pg Final   MCV  Date Value Ref Range Status  08/23/2022 85.0 80.0 - 100.0 fL Final  08/01/2022 85 79 - 97 fL Final   No results found for: "PLTCOUNTKUC", "LABPLAT", "POCPLA" RDW  Date Value Ref Range Status  08/23/2022 14.1 11.5 - 15.5 % Final  08/01/2022 13.6 11.6 - 15.4 % Final         Passed - Cr in normal range and within 360 days    Creat  Date Value Ref Range Status  06/27/2022 1.57 (H) 0.70 - 1.30 mg/dL Final   Creatinine, Ser  Date Value Ref Range Status  08/23/2022 1.24 0.61 - 1.24 mg/dL Final   Creatinine, Urine  Date Value Ref Range Status  06/27/2022 159 20 - 320 mg/dL Final         Passed - Valid encounter within last 12 months    Recent Outpatient Visits            1 month ago Essential hypertension   [facility] Delsa Grana, PA-C   1 month ago Essential hypertension   [facility] [place], Kristeen Miss, PA-C   1 month ago Rash and nonspecific skin eruption   [facility] Delsa Grana, PA-C   2 months ago Pruritus   [facility] Delsa Grana, PA-C   2 months ago Accelerated hypertension   [facility] Delsa Grana, PA-C       Future Appointments             In 4 weeks Kate Sable, MD [place]. [place]   In 1 month Armando Reichert, MD [place]
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
## 2022-08-23 NOTE — H&P (Signed)
History and Physical    Patient: Cory Rivas IFO:[id number] DOB: July 20, 1966 DOA: 08/23/2022 DOS: the patient was seen and examined on 08/23/2022 PCP: Delsa Grana, PA-C  Patient coming from: Home  Chief Complaint:  Chief Complaint  Patient presents with   Shortness of Breath   HPI: Cory Rivas is a 56 y.o. male with medical history significant of bulging cervical disc, cervical spine fusion, lumbar DDD, grade 1 diastolic dysfunction, type 2 diabetes, gout, hyperlipidemia, hypertension, LVH, proteinuria, unspecified dermatitis who is coming to the emergency department dyspnea, productive cough of brown sputum, mild sore throat, fatigue, dizziness and chest soreness for the past 3 days.  He is chest mostly hurts when he is coughing. He denied fever, chills or hemoptysis.  No chest pain, palpitations, diaphoresis, PND, orthopnea or pitting edema of the lower extremities.  No abdominal pain, nausea, emesis, diarrhea, constipation, melena or hematochezia.  No flank pain, dysuria, frequency or hematuria.  No polyuria, polydipsia, polyphagia or blurred vision.   ED course: Initial vital signs were temperature 98 F, pulse 77, respirations 22, BP 183/99 mmHg O2 sat 93% on room air.  The patient received 40 mg of furosemide IVP, 324 mg of aspirin, 5 mL of Tussionex, a DuoNeb and supplemental oxygen.  Lab work: CBC showed a white count of 10.7, Moding 13.9 g/dL platelets 216.  Troponin was 53 then 41 ng/L.  BNP 164.0 pg/mL.  CMP with a glucose of 198.  The rest of the CMP measurements were normal after calcium is corrected.  Imaging: Portable 1 view chest radiograph with diffusely increased interstitial markings compatible with pulmonary edema.   Review of Systems: As mentioned in the history of present illness. All other systems reviewed and are negative.  Past Medical History:  Diagnosis Date   Bulging of cervical intervertebral disc    CHF (congestive heart failure) (HCC)    Diabetes  mellitus without complication (HCC)    Gout    High cholesterol    Hypertension    Stress due to family tension 04/15/2019   Past Surgical History:  Procedure Laterality Date   CHOLECYSTECTOMY     COLONOSCOPY WITH PROPOFOL N/A 03/10/2020   Procedure: COLONOSCOPY WITH PROPOFOL;  Surgeon: Lucilla Lame, MD;  Location: [facility] ENDOSCOPY;  Service: Endoscopy;  Laterality: N/A;   Social History:  reports that he has been smoking cigarettes. He started smoking about 32 years ago. He has a 37.00 pack-year smoking history. He has never used smokeless tobacco. He reports that he does not drink alcohol and does not use drugs.  Allergies  Allergen Reactions   Oxycodone Nausea And Vomiting   Onion Nausea And Vomiting    Family History  Problem Relation Age of Onset   Heart disease Mother    Diabetes Mother    Hyperlipidemia Mother    Hypertension Mother    Heart attack Mother    Lung cancer Father    Hypertension Father    Stroke Father    AAA (abdominal aortic aneurysm) Maternal Grandmother    Heart attack Maternal Grandmother    Diabetes Maternal Grandfather     Prior to Admission medications   Medication Sig Start Date End Date Taking? Authorizing Provider  acetaminophen (TYLENOL) 325 MG tablet SMARTSIG:3 Tablet(s) By Mouth Every 8 Hours PRN 02/07/21   [provider]  allopurinol (ZYLOPRIM) 100 MG tablet TAKE 1 TABLET BY MOUTH ONCE A DAY. TAKE WITH 300 MG TABLETS 08/01/22   Delsa Grana, PA-C  allopurinol (ZYLOPRIM) 300 MG tablet  Take one 300 mg tab po daily WITH 100 mg tab for total of 400 mg once daily for gout 09/04/21   Delsa Grana, PA-C  amLODipine (NORVASC) 10 MG tablet Take 1 tablet (10 mg total) by mouth daily. 01/26/22   Delsa Grana, PA-C  Baclofen 5 MG TABS Take 5-10 mg by mouth 3 (three) times daily as needed (msk pain or spasms). Patient not taking: Reported on 08/15/2022 03/06/22   Delsa Grana, PA-C  benzonatate (TESSALON PERLES) 100 MG capsule Take 1 capsule (100  mg total) by mouth 3 (three) times daily as needed for cough. 06/30/22 06/30/23  Vanessa Conyers, MD  budesonide-formoterol (SYMBICORT) 160-4.5 MCG/ACT inhaler Inhale 2 puffs into the lungs 2 (two) times daily. And can use 2 puffs prn up to every 6 hours for wheeze, SOB 07/23/22   Delsa Grana, PA-C  budesonide-formoterol (SYMBICORT) 160-4.5 MCG/ACT inhaler Inhale 2 puffs into the lungs 2 (two) times daily. 08/01/22   Armando Reichert, MD  Dulaglutide 1.5 MG/0.5ML SOPN Inject into the skin. 12/16/20   [provider]  DULoxetine (CYMBALTA) 30 MG capsule Take 1 capsule (30 mg total) by mouth 2 (two) times daily. 03/06/22   Delsa Grana, PA-C  ezetimibe (ZETIA) 10 MG tablet Take 1 tablet (10 mg total) by mouth daily. 03/06/22   Delsa Grana, PA-C  fluticasone (FLONASE) 50 MCG/ACT nasal spray Place 1 spray into both nostrils daily. 08/01/22 08/01/23  Armando Reichert, MD  furosemide (LASIX) 20 MG tablet Take 1 tablet (20 mg total) by mouth daily. 08/15/22 09/14/22  Arta Silence, MD  hydrALAZINE (APRESOLINE) 100 MG tablet Take 1 tablet (100 mg total) by mouth 3 (three) times daily. Monitor BP 08/07/22   Kate Sable, MD  hydrOXYzine (VISTARIL) 25 MG capsule Take 1 capsule (25 mg total) by mouth every 8 (eight) hours as needed for itching. 06/21/22   Delsa Grana, PA-C  indomethacin (INDOCIN) 25 MG capsule TAKE 2 TABLETS (50 MG) BY 3 TIMES DAILY FOR 2 DAYS AT ONSET OF GOUT FLARE UP, THEN TAKE 1('25MG'$ )TABLET BY MOUTH FOR 3 DAYS 09/05/21   Delsa Grana, PA-C  insulin glargine (LANTUS SOLOSTAR) 100 UNIT/ML Solostar Pen Inject 20 Units into the skin at bedtime. 11/01/20 08/15/22  [provider]  insulin lispro (HUMALOG) 100 UNIT/ML KwikPen Inject into the skin. Sliding scale up to 45 units per injection 11/08/20   [provider]  ipratropium-albuterol (DUONEB) 0.5-2.5 (3) MG/3ML SOLN Take 3 mLs by nebulization every 6 (six) hours as needed (SOB wheeze cough). 07/23/22   Delsa Grana, PA-C   levocetirizine (XYZAL) 5 MG tablet Take 1 tablet (5 mg total) by mouth daily. 08/01/22 08/01/23  Armando Reichert, MD  lisinopril (ZESTRIL) 40 MG tablet Take 1 tablet (40 mg total) by mouth daily. 03/06/22   Delsa Grana, PA-C  metFORMIN (GLUCOPHAGE) 1000 MG tablet TAKE 1 TABLET BY MOUTH TWICE A DAY WITH A MEAL 03/06/22   Delsa Grana, PA-C  nicotine (NICODERM CQ - DOSED IN MG/24 HOURS) 21 mg/24hr patch Place 1 patch (21 mg total) onto the skin daily. 08/01/22 09/12/22  Armando Reichert, MD  nicotine polacrilex (NICOTINE MINI) 2 MG lozenge Take 1 lozenge (2 mg total) by mouth every 2 (two) hours as needed for smoking cessation. 08/01/22 10/30/22  Armando Reichert, MD  Olopatadine HCl 0.2 % SOLN Apply 1 drop to eye 2 (two) times daily as needed (eye allergies). 01/29/22   Delsa Grana, PA-C  pregabalin (LYRICA) 25 MG capsule TAKE 1 CAPSULE BY MOUTH IN THE MORNING  AND 1 TO 3 CAPSULES EVERY NIGHT FOR CERVICAL RADICULOPATHY AND NEUROPATHY Patient not taking: Reported on 08/15/2022 07/10/22   Delsa Grana, PA-C  rosuvastatin (CRESTOR) 40 MG tablet Take 1 tablet (40 mg total) by mouth daily. 01/26/22   Delsa Grana, PA-C  triamcinolone ointment (KENALOG) 0.5 % Apply 1 application. topically 2 (two) times daily. 01/11/22   Teodora Medici, DO  TRULICITY 3 BS/4.9QP SOPN Inject into the skin. 03/26/22   [provider]  VENTOLIN HFA 108 (90 Base) MCG/ACT inhaler INHALE 2 PUFFS INTO THE LUNGS EVERY 6 HOURS AS NEEDED FOR WHEEZING OR SHORTNESS OF BREATH 04/05/22   Delsa Grana, PA-C    Physical Exam: Vitals:   08/23/22 0531 08/23/22 0532  BP: (!) 183/99   Pulse: 77   Resp: (!) 22   Temp: 98 F (36.7 C)   TempSrc: Oral   SpO2: 93%   Weight:  105.7 kg  Height:  '5\' 7"'$  (1.702 m)   Physical Exam Vitals and nursing note reviewed.  Constitutional:      General: He is awake.     Appearance: He is obese. He is ill-appearing.     Interventions: Nasal cannula in place.  HENT:     Head: Normocephalic.      Nose: No rhinorrhea.     Mouth/Throat:     Mouth: Mucous membranes are moist.  Eyes:     General: No scleral icterus.    Pupils: Pupils are equal, round, and reactive to light.  Neck:     Vascular: No JVD.  Cardiovascular:     Rate and Rhythm: Normal rate and regular rhythm.     Heart sounds: S1 normal and S2 normal.  Pulmonary:     Effort: Pulmonary effort is normal.     Breath sounds: Examination of the right-lower field reveals rales. Examination of the left-lower field reveals rales. Wheezing and rales present. No rhonchi.  Abdominal:     General: Bowel sounds are normal.     Palpations: Abdomen is soft.     Tenderness: There is no abdominal tenderness.  Musculoskeletal:     Cervical back: Neck supple.     Right lower leg: No edema.     Left lower leg: No edema.  Skin:    General: Skin is warm and dry.  Neurological:     General: No focal deficit present.     Mental Status: He is alert and oriented to person, place, and time.  Psychiatric:        Mood and Affect: Mood normal.        Behavior: Behavior is cooperative.    Data Reviewed:  There are no new results to review at this time.  Assessment and Plan: Principal Problem:   Acute on chronic diastolic congestive heart failure (HCC) Observation/telemetry. Supplemental oxygen as needed. Sodium and fluid restriction. Continue furosemide 40 mg IVP twice daily. Monitor daily weights, intake and output. Monitor renal function electrolytes. Check echocardiogram.  Active Problems:   Type 2 diabetes mellitus with hyperglycemia (HCC)  Carbohydrate modified diet. Continue metformin 1000 mg p.o. twice daily. Continue Lantus 20 units at bedtime.    Hyperlipidemia associated with type 2 diabetes mellitus ([place]) Currently on rosuvastatin and ezetimibe. Follow-up with primary care provider.    Essential hypertension Continue amlodipine 10 mg p.o. daily. Continue hydralazine 100 mg p.o. 3 times daily. Continue  lisinopril 40 mg p.o. daily.    Tobacco use Has been using his on nicotine lozenges. Continue nicotine replacement therapy as  needed.    Gout Continue allopurinol 300 mg p.o. daily.    Class 2 obesity Current BMI 36.49 kg/m. Needs lifestyle modifications. Follow-up with PCP.    Advance Care Planning:   Code Status: Full Code   Consults:   Family Communication:   Severity of Illness: The appropriate patient status for this patient is OBSERVATION. Observation status is judged to be reasonable and necessary in order to provide the required intensity of service to ensure the patient's safety. The patient's presenting symptoms, physical exam findings, and initial radiographic and laboratory data in the context of their medical condition is felt to place them at decreased risk for further clinical deterioration. Furthermore, it is anticipated that the patient will be medically stable for discharge from the hospital within 2 midnights of admission.   Author: Reubin Milan, MD 08/23/2022 7:32 AM  For on call review [email or web address].   This document was prepared using Dragon voice recognition software and may contain some unintended transcription errors.

## 2022-08-24 ENCOUNTER — Inpatient Hospital Stay: Payer: Medicaid Other

## 2022-08-24 DIAGNOSIS — Z981 Arthrodesis status: Secondary | ICD-10-CM | POA: Diagnosis not present

## 2022-08-24 DIAGNOSIS — E1122 Type 2 diabetes mellitus with diabetic chronic kidney disease: Secondary | ICD-10-CM | POA: Diagnosis not present

## 2022-08-24 DIAGNOSIS — J9601 Acute respiratory failure with hypoxia: Secondary | ICD-10-CM | POA: Diagnosis not present

## 2022-08-24 DIAGNOSIS — Z1152 Encounter for screening for COVID-19: Secondary | ICD-10-CM | POA: Diagnosis not present

## 2022-08-24 DIAGNOSIS — I509 Heart failure, unspecified: Secondary | ICD-10-CM | POA: Diagnosis not present

## 2022-08-24 DIAGNOSIS — Z794 Long term (current) use of insulin: Secondary | ICD-10-CM | POA: Diagnosis not present

## 2022-08-24 DIAGNOSIS — Z833 Family history of diabetes mellitus: Secondary | ICD-10-CM | POA: Diagnosis not present

## 2022-08-24 DIAGNOSIS — Z8249 Family history of ischemic heart disease and other diseases of the circulatory system: Secondary | ICD-10-CM | POA: Diagnosis not present

## 2022-08-24 DIAGNOSIS — E1165 Type 2 diabetes mellitus with hyperglycemia: Secondary | ICD-10-CM | POA: Diagnosis not present

## 2022-08-24 DIAGNOSIS — K760 Fatty (change of) liver, not elsewhere classified: Secondary | ICD-10-CM | POA: Diagnosis not present

## 2022-08-24 DIAGNOSIS — I5033 Acute on chronic diastolic (congestive) heart failure: Secondary | ICD-10-CM | POA: Diagnosis not present

## 2022-08-24 DIAGNOSIS — I110 Hypertensive heart disease with heart failure: Secondary | ICD-10-CM | POA: Diagnosis not present

## 2022-08-24 DIAGNOSIS — N182 Chronic kidney disease, stage 2 (mild): Secondary | ICD-10-CM | POA: Diagnosis not present

## 2022-08-24 DIAGNOSIS — Z83438 Family history of other disorder of lipoprotein metabolism and other lipidemia: Secondary | ICD-10-CM | POA: Diagnosis not present

## 2022-08-24 DIAGNOSIS — I5032 Chronic diastolic (congestive) heart failure: Secondary | ICD-10-CM

## 2022-08-24 DIAGNOSIS — R0789 Other chest pain: Secondary | ICD-10-CM | POA: Diagnosis not present

## 2022-08-24 DIAGNOSIS — E1169 Type 2 diabetes mellitus with other specified complication: Secondary | ICD-10-CM | POA: Diagnosis not present

## 2022-08-24 DIAGNOSIS — M109 Gout, unspecified: Secondary | ICD-10-CM | POA: Diagnosis not present

## 2022-08-24 DIAGNOSIS — M5136 Other intervertebral disc degeneration, lumbar region: Secondary | ICD-10-CM | POA: Diagnosis not present

## 2022-08-24 DIAGNOSIS — R0600 Dyspnea, unspecified: Secondary | ICD-10-CM | POA: Diagnosis not present

## 2022-08-24 DIAGNOSIS — R059 Cough, unspecified: Secondary | ICD-10-CM | POA: Diagnosis not present

## 2022-08-24 DIAGNOSIS — J90 Pleural effusion, not elsewhere classified: Secondary | ICD-10-CM | POA: Diagnosis not present

## 2022-08-24 DIAGNOSIS — I130 Hypertensive heart and chronic kidney disease with heart failure and stage 1 through stage 4 chronic kidney disease, or unspecified chronic kidney disease: Secondary | ICD-10-CM | POA: Diagnosis not present

## 2022-08-24 DIAGNOSIS — Z801 Family history of malignant neoplasm of trachea, bronchus and lung: Secondary | ICD-10-CM | POA: Diagnosis not present

## 2022-08-24 DIAGNOSIS — Z823 Family history of stroke: Secondary | ICD-10-CM | POA: Diagnosis not present

## 2022-08-24 DIAGNOSIS — E7800 Pure hypercholesterolemia, unspecified: Secondary | ICD-10-CM | POA: Diagnosis not present

## 2022-08-24 DIAGNOSIS — M5030 Other cervical disc degeneration, unspecified cervical region: Secondary | ICD-10-CM | POA: Diagnosis not present

## 2022-08-24 DIAGNOSIS — F17210 Nicotine dependence, cigarettes, uncomplicated: Secondary | ICD-10-CM | POA: Diagnosis not present

## 2022-08-24 DIAGNOSIS — Z6839 Body mass index (BMI) 39.0-39.9, adult: Secondary | ICD-10-CM | POA: Diagnosis not present

## 2022-08-24 DIAGNOSIS — N179 Acute kidney failure, unspecified: Secondary | ICD-10-CM | POA: Diagnosis not present

## 2022-08-24 DIAGNOSIS — R0602 Shortness of breath: Secondary | ICD-10-CM | POA: Diagnosis not present

## 2022-08-24 DIAGNOSIS — Z79899 Other long term (current) drug therapy: Secondary | ICD-10-CM | POA: Diagnosis not present

## 2022-08-24 LAB — CBC
HCT: 41.3 % (ref 39.0–52.0)
Hemoglobin: 13.2 g/dL (ref 13.0–17.0)
MCH: 27.3 pg (ref 26.0–34.0)
MCHC: 32 g/dL (ref 30.0–36.0)
MCV: 85.5 fL (ref 80.0–100.0)
Platelets: 193 10*3/uL (ref 150–400)
RBC: 4.83 MIL/uL (ref 4.22–5.81)
RDW: 13.7 % (ref 11.5–15.5)
WBC: 9.7 10*3/uL (ref 4.0–10.5)
nRBC: 0 % (ref 0.0–0.2)

## 2022-08-24 LAB — RENAL FUNCTION PANEL
Albumin: 3.5 g/dL (ref 3.5–5.0)
Anion gap: 7 (ref 5–15)
BUN: 18 mg/dL (ref 6–20)
CO2: 28 mmol/L (ref 22–32)
Calcium: 8.2 mg/dL — ABNORMAL LOW (ref 8.9–10.3)
Chloride: 105 mmol/L (ref 98–111)
Creatinine, Ser: 1.4 mg/dL — ABNORMAL HIGH (ref 0.61–1.24)
GFR, Estimated: 59 mL/min — ABNORMAL LOW (ref >60)
Glucose, Bld: 147 mg/dL — ABNORMAL HIGH (ref 70–99)
Phosphorus: 4.5 mg/dL (ref 2.5–4.6)
Potassium: 3.8 mmol/L (ref 3.5–5.1)
Sodium: 140 mmol/L (ref 135–145)

## 2022-08-24 LAB — GLUCOSE, CAPILLARY
Glucose-Capillary: 139 mg/dL — ABNORMAL HIGH (ref 70–99)
Glucose-Capillary: 117 mg/dL — ABNORMAL HIGH (ref 70–99)
Glucose-Capillary: 169 mg/dL — ABNORMAL HIGH (ref 70–99)
Glucose-Capillary: 240 mg/dL — ABNORMAL HIGH (ref 70–99)

## 2022-08-24 LAB — EXPECTORATED SPUTUM ASSESSMENT W GRAM STAIN, RFLX TO RESP C

## 2022-08-24 LAB — HIV ANTIBODY (ROUTINE TESTING W REFLEX): HIV Screen 4th Generation wRfx: NONREACTIVE

## 2022-08-24 LAB — PROCALCITONIN: Procalcitonin: <0.1 ng/mL

## 2022-08-24 LAB — D-DIMER, QUANTITATIVE: D-Dimer, Quant: 1.08 ug/mL-FEU — ABNORMAL HIGH (ref 0.00–0.50)

## 2022-08-24 LAB — STREP PNEUMONIAE URINARY ANTIGEN: Strep Pneumo Urinary Antigen: NEGATIVE

## 2022-08-24 MED ORDER — IOHEXOL 300 MG/ML  SOLN
75.0000 mL | Freq: Once | INTRAMUSCULAR | Status: AC | PRN
  Administered 2022-08-24: 75 mL via INTRAVENOUS

## 2022-08-24 MED ORDER — PREDNISONE 20 MG PO TABS
40.0000 mg | ORAL_TABLET | Freq: Every day | ORAL | Status: DC
  Administered 2022-08-24 – 2022-08-25 (×2): 40 mg via ORAL
  Filled 2022-08-24 (×2): qty 2

## 2022-08-24 MED ORDER — DULOXETINE HCL 30 MG PO CPEP
30.0000 mg | ORAL_CAPSULE | Freq: Two times a day (BID) | ORAL | Status: DC
  Administered 2022-08-24 – 2022-08-25 (×3): 30 mg via ORAL
  Filled 2022-08-24 (×3): qty 1

## 2022-08-24 MED ORDER — NICOTINE 21 MG/24HR TD PT24
21.0000 mg | MEDICATED_PATCH | Freq: Every day | TRANSDERMAL | Status: DC
  Administered 2022-08-25: 21 mg via TRANSDERMAL
  Filled 2022-08-24: qty 1

## 2022-08-24 MED ORDER — IPRATROPIUM-ALBUTEROL 0.5-2.5 (3) MG/3ML IN SOLN: 3.0000 mL | Freq: Four times a day (QID) | RESPIRATORY_TRACT | Status: DC | PRN

## 2022-08-24 MED ORDER — PREGABALIN 75 MG PO CAPS
75.0000 mg | ORAL_CAPSULE | Freq: Every day | ORAL | Status: DC
  Administered 2022-08-24 – 2022-08-25 (×2): 75 mg via ORAL
  Filled 2022-08-24 (×2): qty 1

## 2022-08-24 MED ORDER — IPRATROPIUM-ALBUTEROL 0.5-2.5 (3) MG/3ML IN SOLN
3.0000 mL | Freq: Four times a day (QID) | RESPIRATORY_TRACT | Status: DC
  Administered 2022-08-24: 3 mL via RESPIRATORY_TRACT
  Filled 2022-08-24: qty 3

## 2022-08-24 MED ORDER — AMLODIPINE BESYLATE 10 MG PO TABS
10.0000 mg | ORAL_TABLET | Freq: Every day | ORAL | Status: DC
  Administered 2022-08-24 – 2022-08-25 (×2): 10 mg via ORAL
  Filled 2022-08-24 (×2): qty 1

## 2022-08-24 MED ORDER — ALLOPURINOL 100 MG PO TABS: 300.0000 mg | ORAL_TABLET | Freq: Every day | ORAL | Status: DC

## 2022-08-24 NOTE — Progress Notes (Signed)
PROGRESS NOTE    Cory Rivas  YNW:[id number] DOB: [birth year] DOA: 08/23/2022 PCP: Delsa Grana, PA-C  Outpatient Specialists: cardiology, pulmnology    Brief Narrative:   From admission h and p Cory Rivas is a 56 y.o. male with medical history significant of bulging cervical disc, cervical spine fusion, lumbar DDD, grade 1 diastolic dysfunction, type 2 diabetes, gout, hyperlipidemia, hypertension, LVH, proteinuria, unspecified dermatitis who is coming to the emergency department dyspnea, productive cough of brown sputum, mild sore throat, fatigue, dizziness and chest soreness for the past 3 days.  He is chest mostly hurts when he is coughing. He denied fever, chills or hemoptysis.  No chest pain, palpitations, diaphoresis, PND, orthopnea or pitting edema of the lower extremities.  No abdominal pain, nausea, emesis, diarrhea, constipation, melena or hematochezia.  No flank pain, dysuria, frequency or hematuria.  No polyuria, polydipsia, polyphagia or blurred vision.     Assessment & Plan:   Principal Problem:   Acute on chronic diastolic congestive heart failure (HCC) Active Problems:   Hyperlipidemia associated with type 2 diabetes mellitus (HCC)   Essential hypertension   Class 2 severe obesity with serious comorbidity and body mass index (BMI) of 39.0 to 39.9 in adult [facility])   Chronic kidney disease (CKD) stage G2/A3, mildly decreased glomerular filtration rate (GFR) between 60-89 mL/min/1.73 square meter and albuminuria creatinine ratio greater than 300 mg/g   Tobacco use   Gout   Class 2 obesity   Type 2 diabetes mellitus with hyperglycemia (HCC)   CHF exacerbation (HCC)  # Acute hypoxic respiratory failure Not on o2 at home, here dyspneic on arrival, currently on 2 L - wean O2 as able  # Dyspnea Presents with wheezing, productive cough, dyspnea. Recently established with pulm, w/u is underway. Asthmatic component possible and patient does also have long hx of smoking.  Mainstem bronchus also shows narrowing so possible TBM. Here patient says symptoms have improved with diuresis, there is diastolic dysfunction on TTE, bnp is elevated, and cxr shows pulmonary edema. Did have covid last month. - outpt w/u as scheduled - urine legionella and strep antigens - sputum for culture - f/u procal, dimer. CTA if if dimer negative - prednisone, holding on abx for now - duonebs scheduled  # HFpEF TTE with preserved EF, no sig valvular dysfunction, g2 dd - continue lasix 40 IV qd  # T2DM A1c 7.5, here glucose controlled - basal/bolus insulin  # HTN BP controlled - home amlodipine - hold home lisinopril while duiresing - cont home statin  # Gout - home allopurinol  # Chronic pain - home duloxetine, pregabalin  # Obesity Noted  DVT prophylaxis: lovenox Code Status: full Family Communication: none @ bedside  Level of care: Progressive Status is: inpt    Consultants:  none  Procedures: none  Antimicrobials:  none    Subjective: Says breathing improved, no chest pain  Objective: Vitals:   08/23/22 2329 08/24/22 0319 08/24/22 0500 08/24/22 0857  BP: 136/76 134/83  130/69  Pulse: 76 69  67  Resp: '18 18  19  '$ Temp: 99 F (37.2 C) 98.6 F (37 C)  98.3 F (36.8 C)  TempSrc: Oral     SpO2: 97% 97%  98%  Weight:   107.6 kg   Height:        Intake/Output Summary (Last 24 hours) at 08/24/2022 1037 Last data filed at 08/24/2022 1015 Gross per 24 hour  Intake 120 ml  Output 500 ml  Net -380 ml  Filed Weights   08/23/22 0532 08/23/22 2115 08/24/22 0500  Weight: 105.7 kg 107.6 kg 107.6 kg    Examination:  General exam: Appears calm and comfortable  Respiratory system: few scattered rhonchi Cardiovascular system: S1 & S2 heard, RRR. No JVD, murmurs, rubs, gallops or clicks. No pedal edema. Gastrointestinal system: Abdomen is obese, soft and nontender. No organomegaly or masses felt. Normal bowel sounds heard. Central nervous  system: Alert and oriented. No focal neurological deficits. Extremities: Symmetric 5 x 5 power. Skin: No rashes, lesions or ulcers Psychiatry: Judgement and insight appear normal. Mood & affect appropriate.     Data Reviewed: I have personally reviewed following labs and imaging studies  CBC: Recent Labs  Lab 08/23/22 0534 08/24/22 0458  WBC 10.7* 9.7  NEUTROABS 8.0*  --   HGB 13.9 13.2  HCT 42.6 41.3  MCV 85.0 85.5  PLT 216 517   Basic Metabolic Panel: Recent Labs  Lab 08/23/22 0534 08/24/22 0458  NA 139 140  K 3.7 3.8  CL 104 105  CO2 23 28  GLUCOSE 198* 147*  BUN 14 18  CREATININE 1.24 1.40*  CALCIUM 8.6* 8.2*  PHOS  --  4.5   GFR: Estimated Creatinine Clearance: 69.7 mL/min (A) (by C-G formula based on SCr of 1.4 mg/dL (H)). Liver Function Tests: Recent Labs  Lab 08/23/22 0534 08/24/22 0458  AST 25  --   ALT 21  --   ALKPHOS 68  --   BILITOT 1.2  --   PROT 7.3  --   ALBUMIN 3.7 3.5   No results for input(s): "LIPASE", "AMYLASE" in the last 168 hours. No results for input(s): "AMMONIA" in the last 168 hours. Coagulation Profile: No results for input(s): "INR", "PROTIME" in the last 168 hours. Cardiac Enzymes: No results for input(s): "CKTOTAL", "CKMB", "CKMBINDEX", "TROPONINI" in the last 168 hours. BNP (last 3 results) No results for input(s): "PROBNP" in the last 8760 hours. HbA1C: Recent Labs    08/23/22 0907  HGBA1C 7.5*   CBG: Recent Labs  Lab 08/23/22 1104 08/23/22 1646 08/23/22 2124 08/24/22 0859  GLUCAP 185* 146* 134* 139*   Lipid Profile: No results for input(s): "CHOL", "HDL", "LDLCALC", "TRIG", "CHOLHDL", "LDLDIRECT" in the last 72 hours. Thyroid Function Tests: No results for input(s): "TSH", "T4TOTAL", "FREET4", "T3FREE", "THYROIDAB" in the last 72 hours. Anemia Panel: No results for input(s): "VITAMINB12", "FOLATE", "FERRITIN", "TIBC", "IRON", "RETICCTPCT" in the last 72 hours. Urine analysis:    Component Value  Date/Time   COLORURINE YELLOW 07/03/2021 [place] 07/03/2021 1405   APPEARANCEUR Clear 04/28/2014 2040   LABSPEC >1.030 (H) 07/03/2021 1405   LABSPEC 1.030 04/28/2014 2040   PHURINE 5.5 07/03/2021 1405   GLUCOSEU 100 (A) 07/03/2021 1405   GLUCOSEU Negative 04/28/2014 2040   HGBUR TRACE (A) 07/03/2021 1405   BILIRUBINUR Negative 06/27/2022 1527   BILIRUBINUR Negative 04/28/2014 2040   KETONESUR NEGATIVE 07/03/2021 1405   PROTEINUR Positive (A) 06/27/2022 1527   PROTEINUR >300 (A) 07/03/2021 1405   UROBILINOGEN 0.2 06/27/2022 1527   NITRITE Negative 06/27/2022 1527   NITRITE NEGATIVE 07/03/2021 1405   LEUKOCYTESUR Negative 06/27/2022 1527   LEUKOCYTESUR NEGATIVE 07/03/2021 1405   LEUKOCYTESUR Negative 04/28/2014 2040   Sepsis Labs: '@LABRCNTIP'$ (procalcitonin:4,lacticidven:4)  ) Recent Results (from the past 240 hour(s))  Resp Panel by RT-PCR (Flu A&B, Covid) Anterior Nasal Swab     Status: None   Collection Time: 08/15/22  8:12 AM   Specimen: Anterior Nasal Swab  Result Value  Ref Range Status   SARS Coronavirus 2 by RT PCR NEGATIVE NEGATIVE Final    Comment: (NOTE) SARS-CoV-2 target nucleic acids are NOT DETECTED.  The SARS-CoV-2 RNA is generally detectable in upper respiratory specimens during the acute phase of infection. The lowest concentration of SARS-CoV-2 viral copies this assay can detect is 138 copies/mL. A negative result does not preclude SARS-Cov-2 infection and should not be used as the sole basis for treatment or other patient management decisions. A negative result may occur with  improper specimen collection/handling, submission of specimen other than nasopharyngeal swab, presence of viral mutation(s) within the areas targeted by this assay, and inadequate number of viral copies(<138 copies/mL). A negative result must be combined with clinical observations, patient history, and epidemiological information. The expected result is  Negative.  Fact Sheet for Patients:  EntrepreneurPulse.com.au  Fact Sheet for Healthcare Providers:  IncredibleEmployment.be  This test is no t yet approved or cleared by the [place] FDA and  has been authorized for detection and/or diagnosis of SARS-CoV-2 by FDA under an Emergency Use Authorization (EUA). This EUA will remain  in effect (meaning this test can be used) for the duration of the COVID-19 declaration under Section 564(b)(1) of the Act, 21 U.S.C.section 360bbb-3(b)(1), unless the authorization is terminated  or revoked sooner.       Influenza A by PCR NEGATIVE NEGATIVE Final   Influenza B by PCR NEGATIVE NEGATIVE Final    Comment: (NOTE) The Xpert Xpress SARS-CoV-2/FLU/RSV plus assay is intended as an aid in the diagnosis of influenza from Nasopharyngeal swab specimens and should not be used as a sole basis for treatment. Nasal washings and aspirates are unacceptable for Xpert Xpress SARS-CoV-2/FLU/RSV testing.  Fact Sheet for Patients: EntrepreneurPulse.com.au  Fact Sheet for Healthcare Providers: IncredibleEmployment.be  This test is not yet approved or cleared by the [place] FDA and has been authorized for detection and/or diagnosis of SARS-CoV-2 by FDA under an Emergency Use Authorization (EUA). This EUA will remain in effect (meaning this test can be used) for the duration of the COVID-19 declaration under Section 564(b)(1) of the Act, 21 U.S.C. section 360bbb-3(b)(1), unless the authorization is terminated or revoked.  Performed at [facility], [address] [place], [place] [address]   Resp Panel by RT-PCR (Flu A&B, Covid) Anterior Nasal Swab     Status: None   Collection Time: 08/23/22  5:34 AM   Specimen: Anterior Nasal Swab  Result Value Ref Range Status   SARS Coronavirus 2 by RT PCR NEGATIVE NEGATIVE Final    Comment: (NOTE) SARS-CoV-2 target nucleic  acids are NOT DETECTED.  The SARS-CoV-2 RNA is generally detectable in upper respiratory specimens during the acute phase of infection. The lowest concentration of SARS-CoV-2 viral copies this assay can detect is 138 copies/mL. A negative result does not preclude SARS-Cov-2 infection and should not be used as the sole basis for treatment or other patient management decisions. A negative result may occur with  improper specimen collection/handling, submission of specimen other than nasopharyngeal swab, presence of viral mutation(s) within the areas targeted by this assay, and inadequate number of viral copies(<138 copies/mL). A negative result must be combined with clinical observations, patient history, and epidemiological information. The expected result is Negative.  Fact Sheet for Patients:  EntrepreneurPulse.com.au  Fact Sheet for Healthcare Providers:  IncredibleEmployment.be  This test is no t yet approved or cleared by the [place] FDA and  has been authorized for detection and/or diagnosis of SARS-CoV-2 by FDA  under an Emergency Use Authorization (EUA). This EUA will remain  in effect (meaning this test can be used) for the duration of the COVID-19 declaration under Section 564(b)(1) of the Act, 21 U.S.C.section 360bbb-3(b)(1), unless the authorization is terminated  or revoked sooner.       Influenza A by PCR NEGATIVE NEGATIVE Final   Influenza B by PCR NEGATIVE NEGATIVE Final    Comment: (NOTE) The Xpert Xpress SARS-CoV-2/FLU/RSV plus assay is intended as an aid in the diagnosis of influenza from Nasopharyngeal swab specimens and should not be used as a sole basis for treatment. Nasal washings and aspirates are unacceptable for Xpert Xpress SARS-CoV-2/FLU/RSV testing.  Fact Sheet for Patients: EntrepreneurPulse.com.au  Fact Sheet for Healthcare Providers: IncredibleEmployment.be  This  test is not yet approved or cleared by the [place] FDA and has been authorized for detection and/or diagnosis of SARS-CoV-2 by FDA under an Emergency Use Authorization (EUA). This EUA will remain in effect (meaning this test can be used) for the duration of the COVID-19 declaration under Section 564(b)(1) of the Act, 21 U.S.C. section 360bbb-3(b)(1), unless the authorization is terminated or revoked.  Performed at [facility], [address], [place], [place] [address]          Radiology Studies: ECHOCARDIOGRAM COMPLETE  Result Date: 08/23/2022    ECHOCARDIOGRAM REPORT   Patient Name:   Cory Rivas Date of Exam: 08/23/2022 Medical Rec #:  [id number]       Height:       67.0 in Accession #:    [id number]      Weight:       233.0 lb Date of Birth:  18-Aug-1966        BSA:          2.158 m Patient Age:    43 years        BP:           173/101 mmHg Patient Gender: M               HR:           72 bpm. Exam Location:  [facility] Procedure: 2D Echo, Cardiac Doppler and Color Doppler Indications:     CHF-acute diastolic Q33.00  History:         Patient has prior history of Echocardiogram examinations, most                  recent 11/04/2020. CHF; Risk Factors:Hypertension and Diabetes.  Sonographer:     Sherrie Sport Referring Phys:  [id number] [place] Diagnosing Phys: Neoma Laming  Sonographer Comments: Image quality was fair. IMPRESSIONS  1. Left ventricular ejection fraction, by estimation, is 60 to 65%. The left ventricle has normal function. The left ventricle has no regional wall motion abnormalities. There is mild left ventricular hypertrophy. Left ventricular diastolic parameters are consistent with Grade II diastolic dysfunction (pseudonormalization).  2. Right ventricular systolic function is normal. The right ventricular size is normal.  3. The mitral valve is normal in structure. Mild mitral valve regurgitation. No evidence of mitral stenosis.  4. The aortic valve is  calcified. Aortic valve regurgitation is not visualized. Aortic valve sclerosis/calcification is present, without any evidence of aortic stenosis.  5. The inferior vena cava is normal in size with greater than 50% respiratory variability, suggesting right atrial pressure of 3 mmHg. FINDINGS  Left Ventricle: Left ventricular ejection fraction, by estimation, is 60 to 65%. The left ventricle has normal function. The left  ventricle has no regional wall motion abnormalities. The left ventricular internal cavity size was normal in size. There is  mild left ventricular hypertrophy. Left ventricular diastolic parameters are consistent with Grade II diastolic dysfunction (pseudonormalization). Right Ventricle: The right ventricular size is normal. No increase in right ventricular wall thickness. Right ventricular systolic function is normal. Left Atrium: Left atrial size was normal in size. Right Atrium: Right atrial size was normal in size. Pericardium: There is no evidence of pericardial effusion. Mitral Valve: The mitral valve is normal in structure. Mild mitral valve regurgitation. No evidence of mitral valve stenosis. Tricuspid Valve: The tricuspid valve is normal in structure. Tricuspid valve regurgitation is mild . No evidence of tricuspid stenosis. Aortic Valve: The aortic valve is calcified. Aortic valve regurgitation is not visualized. Aortic valve sclerosis/calcification is present, without any evidence of aortic stenosis. Aortic valve mean gradient measures 8.0 mmHg. Aortic valve peak gradient measures 14.5 mmHg. Aortic valve area, by VTI measures 1.83 cm. Pulmonic Valve: The pulmonic valve was normal in structure. Pulmonic valve regurgitation is not visualized. No evidence of pulmonic stenosis. Aorta: The aortic root is normal in size and structure. Venous: The inferior vena cava is normal in size with greater than 50% respiratory variability, suggesting right atrial pressure of 3 mmHg. IAS/Shunts: No atrial  level shunt detected by color flow Doppler.  LEFT VENTRICLE PLAX 2D LVIDd:         5.30 cm   Diastology LVIDs:         3.50 cm   LV e' medial:    6.42 cm/s LV PW:         1.50 cm   LV E/e' medial:  24.1 LV IVS:        1.50 cm   LV e' lateral:   6.74 cm/s LVOT diam:     2.00 cm   LV E/e' lateral: 23.0 LV SV:         65 LV SV Index:   30 LVOT Area:     3.14 cm  RIGHT VENTRICLE RV Basal diam:  3.40 cm RV Mid diam:    3.00 cm RV S prime:     13.50 cm/s TAPSE (M-mode): 2.6 cm LEFT ATRIUM             Index        RIGHT ATRIUM           Index LA diam:        4.70 cm 2.18 cm/m   RA Area:     13.00 cm LA Vol (A2C):   46.5 ml 21.55 ml/m  RA Volume:   28.10 ml  13.02 ml/m LA Vol (A4C):   25.0 ml 11.59 ml/m LA Biplane Vol: 38.0 ml 17.61 ml/m  AORTIC VALVE AV Area (Vmax):    1.78 cm AV Area (Vmean):   1.73 cm AV Area (VTI):     1.83 cm AV Vmax:           190.33 cm/s AV Vmean:          130.333 cm/s AV VTI:            0.357 m AV Peak Grad:      14.5 mmHg AV Mean Grad:      8.0 mmHg LVOT Vmax:         108.00 cm/s LVOT Vmean:        71.800 cm/s LVOT VTI:          0.208 m LVOT/AV VTI ratio:  0.64  AORTA Ao Root diam: 3.00 cm MITRAL VALVE                TRICUSPID VALVE MV Area (PHT): 6.37 cm     TR Peak grad:   39.9 mmHg MV Decel Time: 119 msec     TR Vmax:        316.00 cm/s MV E velocity: 155.00 cm/s MV A velocity: 61.80 cm/s   SHUNTS MV E/A ratio:  2.51         Systemic VTI:  0.21 m                             Systemic Diam: 2.00 cm Neoma Laming Electronically signed by Neoma Laming Signature Date/Time: 08/23/2022/2:32:57 PM    Final    DG Chest Portable 1 View  Result Date: 08/23/2022 CLINICAL DATA:  Shortness of breath and cough. EXAM: PORTABLE CHEST 1 VIEW COMPARISON:  08/15/2022 FINDINGS: Normal cardiomediastinal contours. Diffusely increased interstitial markings are identified bilaterally. Cephalized blood flow noted. No airspace consolidation. Cervicothoracic fusion IMPRESSION: Diffusely increased interstitial  markings compatible with pulmonary edema. Electronically Signed   By: Kerby Moors M.D.   On: 08/23/2022 06:28        Scheduled Meds:  allopurinol  400 mg Oral Daily   amLODipine  10 mg Oral Daily   DULoxetine  30 mg Oral BID   enoxaparin (LOVENOX) injection  0.5 mg/kg Subcutaneous Q24H   ezetimibe  10 mg Oral Daily   furosemide  40 mg Intravenous Daily   hydrALAZINE  100 mg Oral TID   insulin aspart  0-20 Units Subcutaneous TID WC   insulin glargine-yfgn  20 Units Subcutaneous QHS   ipratropium-albuterol  3 mL Nebulization Q6H   mometasone-formoterol  2 puff Inhalation BID   predniSONE  40 mg Oral Q breakfast   pregabalin  75 mg Oral Daily   rosuvastatin  40 mg Oral QPM   Continuous Infusions:   LOS: 0 days     Desma Maxim, MD [facility]   If 7PM-7AM, please contact night-coverage [email or web address] Password [facility] 08/24/2022, 10:37 AM

## 2022-08-25 DIAGNOSIS — I5033 Acute on chronic diastolic (congestive) heart failure: Secondary | ICD-10-CM | POA: Diagnosis not present

## 2022-08-25 LAB — CBC
HCT: 42.7 % (ref 39.0–52.0)
Hemoglobin: 14 g/dL (ref 13.0–17.0)
MCH: 27.3 pg (ref 26.0–34.0)
MCHC: 32.8 g/dL (ref 30.0–36.0)
MCV: 83.4 fL (ref 80.0–100.0)
Platelets: 265 10*3/uL (ref 150–400)
RBC: 5.12 MIL/uL (ref 4.22–5.81)
RDW: 13.6 % (ref 11.5–15.5)
WBC: 12.3 10*3/uL — ABNORMAL HIGH (ref 4.0–10.5)
nRBC: 0 % (ref 0.0–0.2)

## 2022-08-25 LAB — BASIC METABOLIC PANEL
Anion gap: 9 (ref 5–15)
BUN: 28 mg/dL — ABNORMAL HIGH (ref 6–20)
CO2: 26 mmol/L (ref 22–32)
Calcium: 8.6 mg/dL — ABNORMAL LOW (ref 8.9–10.3)
Chloride: 105 mmol/L (ref 98–111)
Creatinine, Ser: 1.64 mg/dL — ABNORMAL HIGH (ref 0.61–1.24)
GFR, Estimated: 49 mL/min — ABNORMAL LOW (ref >60)
Glucose, Bld: 115 mg/dL — ABNORMAL HIGH (ref 70–99)
Potassium: 3.5 mmol/L (ref 3.5–5.1)
Sodium: 140 mmol/L (ref 135–145)

## 2022-08-25 LAB — RENAL FUNCTION PANEL
Albumin: 3.5 g/dL (ref 3.5–5.0)
Anion gap: 9 (ref 5–15)
BUN: 28 mg/dL — ABNORMAL HIGH (ref 6–20)
CO2: 25 mmol/L (ref 22–32)
Calcium: 8.7 mg/dL — ABNORMAL LOW (ref 8.9–10.3)
Chloride: 106 mmol/L (ref 98–111)
Creatinine, Ser: 1.68 mg/dL — ABNORMAL HIGH (ref 0.61–1.24)
GFR, Estimated: 48 mL/min — ABNORMAL LOW (ref >60)
Glucose, Bld: 113 mg/dL — ABNORMAL HIGH (ref 70–99)
Phosphorus: 4.6 mg/dL (ref 2.5–4.6)
Potassium: 3.5 mmol/L (ref 3.5–5.1)
Sodium: 140 mmol/L (ref 135–145)

## 2022-08-25 LAB — GLUCOSE, CAPILLARY
Glucose-Capillary: 121 mg/dL — ABNORMAL HIGH (ref 70–99)
Glucose-Capillary: 262 mg/dL — ABNORMAL HIGH (ref 70–99)

## 2022-08-25 MED ORDER — PREDNISONE 20 MG PO TABS: 40.0000 mg | ORAL_TABLET | Freq: Every day | ORAL | 0 refills | Status: DC

## 2022-08-25 NOTE — Plan of Care (Signed)

## 2022-08-25 NOTE — Discharge Summary (Signed)
Cory Rivas:[id number] DOB: Dec 02, 1965 DOA: 08/23/2022  PCP: Delsa Grana, PA-C  Admit date: 08/23/2022 Discharge date: 08/25/2022  Time spent: 36 minutes  Recommendations for Outpatient Follow-up:  Pcp f/u 1 week, check kidney function then Cardiology and pulmonology f/u as scheduled     Discharge Diagnoses:  Principal Problem:   Acute on chronic diastolic congestive heart failure ([place]) Active Problems:   Hyperlipidemia associated with type 2 diabetes mellitus ([place])   Essential hypertension   Class 2 severe obesity with serious comorbidity and body mass index (BMI) of 39.0 to 39.9 in adult [facility])   Chronic kidney disease (CKD) stage G2/A3, mildly decreased glomerular filtration rate (GFR) between 60-89 mL/min/1.73 square meter and albuminuria creatinine ratio greater than 300 mg/g   Tobacco use   Gout   Class 2 obesity   Type 2 diabetes mellitus with hyperglycemia ([place])   CHF exacerbation ([place])   Discharge Condition: improved  Diet recommendation: heart healthy  Filed Weights   08/23/22 2115 08/24/22 0500 08/25/22 0500  Weight: 107.6 kg 107.6 kg 105.1 kg    History of present illness:  From admission h and p Cory Rivas is a 56 y.o. male with medical history significant of bulging cervical disc, cervical spine fusion, lumbar DDD, grade 1 diastolic dysfunction, type 2 diabetes, gout, hyperlipidemia, hypertension, LVH, proteinuria, unspecified dermatitis who is coming to the emergency department dyspnea, productive cough of brown sputum, mild sore throat, fatigue, dizziness and chest soreness for the past 3 days.  He is chest mostly hurts when he is coughing. He denied fever, chills or hemoptysis.  No chest pain, palpitations, diaphoresis, PND, orthopnea or pitting edema of the lower extremities.  No abdominal pain, nausea, emesis, diarrhea, constipation, melena or hematochezia.  No flank pain, dysuria, frequency or hematuria.  No polyuria, polydipsia, polyphagia or  blurred vision.   Hospital Course:  Expand All Collapse All   PROGRESS NOTE       DUGAN VANHOESEN         BJS:[id number] DOB: 03-17-1966 DOA: 08/23/2022 PCP: Delsa Grana, PA-C         Outpatient Specialists: cardiology, pulmnology       Brief Narrative:    From admission h and p Cory Rivas is a 56 y.o. male with medical history significant of bulging cervical disc, cervical spine fusion, lumbar DDD, grade 1 diastolic dysfunction, type 2 diabetes, gout, hyperlipidemia, hypertension, LVH, proteinuria, unspecified dermatitis who is coming to the emergency department dyspnea, productive cough of brown sputum, mild sore throat, fatigue, dizziness and chest soreness for the past 3 days.  He is chest mostly hurts when he is coughing. He denied fever, chills or hemoptysis.  No chest pain, palpitations, diaphoresis, PND, orthopnea or pitting edema of the lower extremities.  No abdominal pain, nausea, emesis, diarrhea, constipation, melena or hematochezia.  No flank pain, dysuria, frequency or hematuria.  No polyuria, polydipsia, polyphagia or blurred vision.      Assessment & Plan:   # Acute hypoxic respiratory failure Not on o2 at home, here dyspneic on arrival, placed on 2 L, hence weaned off and now breathing comfortably on room air with normal o2   # Dyspnea # Reactive airway disease Presents with wheezing, productive cough, dyspnea. Recently established with pulm, w/u is underway. Asthmatic component possible and patient does also have long hx of smoking. Mainstem bronchus also shows narrowing so possible TBM. Here patient says symptoms have improved with diuresis and steroids, there is diastolic dysfunction on TTE,  bnp is elevated, and cxr shows pulmonary edema. Did have covid last month. - continue steroid burst, resume home lasix - pcp f/u 1 week, outpt cardiology and pulm f/u as scheduled   # HFpEF TTE with preserved EF, no sig valvular dysfunction, g2 dd. Diuresed here with IV  lasix - resum ehome lasix  # AKI  Cr 1.6 with diuresis, likely 2/2 that - advise holding home lisinopril until pcp f/u in about a week, will need check of kidney function then   # T2DM A1c 7.5, here glucose controlled   # HTN BP controlled         Procedures: none   Consultations: none  Discharge Exam: Vitals:   08/25/22 0729 08/25/22 1142  BP: 135/81 130/68  Pulse: 70 85  Resp: 20 20  Temp: 98.1 F (36.7 C) 99 F (37.2 C)  SpO2: 94% 92%    General: NAD Cardiovascular: RRR Respiratory: CTAB  Discharge Instructions   Discharge Instructions     Diet - low sodium heart healthy   Complete by: As directed    Increase activity slowly   Complete by: As directed       Allergies as of 08/25/2022       Reactions   Oxycodone Nausea And Vomiting   Onion Nausea And Vomiting        Medication List     STOP taking these medications    lisinopril 40 MG tablet Commonly known as: ZESTRIL       TAKE these medications    acetaminophen 325 MG tablet Commonly known as: TYLENOL SMARTSIG:3 Tablet(s) By Mouth Every 8 Hours PRN   allopurinol 300 MG tablet Commonly known as: ZYLOPRIM Take one 300 mg tab po daily WITH 100 mg tab for total of 400 mg once daily for gout   allopurinol 100 MG tablet Commonly known as: ZYLOPRIM TAKE 1 TABLET BY MOUTH ONCE A DAY. TAKE WITH 300 MG TABLETS   amLODipine 10 MG tablet Commonly known as: NORVASC Take 1 tablet (10 mg total) by mouth daily.   Baclofen 5 MG Tabs Take 5-10 mg by mouth 3 (three) times daily as needed (msk pain or spasms).   benzonatate 100 MG capsule Commonly known as: Tessalon Perles Take 1 capsule (100 mg total) by mouth 3 (three) times daily as needed for cough.   budesonide-formoterol 160-4.5 MCG/ACT inhaler Commonly known as: SYMBICORT Inhale 2 puffs into the lungs 2 (two) times daily. And can use 2 puffs prn up to every 6 hours for wheeze, SOB   budesonide-formoterol 160-4.5 MCG/ACT  inhaler Commonly known as: Symbicort Inhale 2 puffs into the lungs 2 (two) times daily.   Dulaglutide 1.5 MG/0.5ML Sopn Inject into the skin.   Trulicity 3 DJ/2.4QA Sopn Generic drug: Dulaglutide Inject into the skin.   DULoxetine 30 MG capsule Commonly known as: CYMBALTA Take 1 capsule (30 mg total) by mouth 2 (two) times daily.   ezetimibe 10 MG tablet Commonly known as: Zetia Take 1 tablet (10 mg total) by mouth daily.   fluticasone 50 MCG/ACT nasal spray Commonly known as: FLONASE Place 1 spray into both nostrils daily.   furosemide 20 MG tablet Commonly known as: Lasix Take 1 tablet (20 mg total) by mouth daily.   hydrALAZINE 100 MG tablet Commonly known as: APRESOLINE Take 1 tablet (100 mg total) by mouth 3 (three) times daily. Monitor BP   hydrOXYzine 25 MG capsule Commonly known as: VISTARIL Take 1 capsule (25 mg total) by mouth every 8 (  eight) hours as needed for itching.   indomethacin 25 MG capsule Commonly known as: INDOCIN TAKE 2 TABLETS (50 MG) BY 3 TIMES DAILY FOR 2 DAYS AT ONSET OF GOUT FLARE UP, THEN TAKE 1('25MG'$ )TABLET BY MOUTH FOR 3 DAYS   insulin lispro 100 UNIT/ML KwikPen Commonly known as: HUMALOG Inject 1-45 Units into the skin 3 (three) times daily. Sliding scale up to 45 units per injection   ipratropium-albuterol 0.5-2.5 (3) MG/3ML Soln Commonly known as: DUONEB Take 3 mLs by nebulization every 6 (six) hours as needed (SOB wheeze cough).   Lantus SoloStar 100 UNIT/ML Solostar Pen Generic drug: insulin glargine Inject 20 Units into the skin at bedtime.   levocetirizine 5 MG tablet Commonly known as: XYZAL Take 1 tablet (5 mg total) by mouth daily.   metFORMIN 1000 MG tablet Commonly known as: GLUCOPHAGE TAKE 1 TABLET BY MOUTH TWICE A DAY WITH A MEAL   nicotine 21 mg/24hr patch Commonly known as: NICODERM CQ - dosed in mg/24 hours Place 1 patch (21 mg total) onto the skin daily.   nicotine polacrilex 2 MG lozenge Commonly known  as: Nicotine Mini Take 1 lozenge (2 mg total) by mouth every 2 (two) hours as needed for smoking cessation.   Olopatadine HCl 0.2 % Soln Apply 1 drop to eye 2 (two) times daily as needed (eye allergies).   predniSONE 20 MG tablet Commonly known as: DELTASONE Take 2 tablets (40 mg total) by mouth daily with breakfast. Start taking on: August 26, 2022   pregabalin 25 MG capsule Commonly known as: LYRICA TAKE 1 CAPSULE BY MOUTH IN THE MORNING AND 1 TO 3 CAPSULES EVERY NIGHT FOR CERVICAL RADICULOPATHY AND NEUROPATHY   rosuvastatin 40 MG tablet Commonly known as: Crestor Take 1 tablet (40 mg total) by mouth daily.   triamcinolone ointment 0.5 % Commonly known as: KENALOG Apply 1 application. topically 2 (two) times daily.   Ventolin HFA 108 (90 Base) MCG/ACT inhaler Generic drug: albuterol INHALE 2 PUFFS INTO THE LUNGS EVERY 6 HOURS AS NEEDED FOR WHEEZING OR SHORTNESS OF BREATH       Allergies  Allergen Reactions   Oxycodone Nausea And Vomiting   Onion Nausea And Vomiting    Follow-up Information     Delsa Grana, PA-C Follow up in 1 week(s).   Specialty: Family Medicine Contact information: [address] [place] [address] [phone]                  The results of significant diagnostics from this hospitalization (including imaging, microbiology, ancillary and laboratory) are listed below for reference.    Significant Diagnostic Studies: CT Angio Chest Pulmonary Embolism (PE) W or WO Contrast  Result Date: 08/24/2022 CLINICAL DATA:  Dyspnea, elevated D-dimer EXAM: CT ANGIOGRAPHY CHEST WITH CONTRAST TECHNIQUE: Multidetector CT imaging of the chest was performed using the standard protocol during bolus administration of intravenous contrast. Multiplanar CT image reconstructions and MIPs were obtained to evaluate the vascular anatomy. RADIATION DOSE REDUCTION: This exam was performed according to the departmental dose-optimization program  which includes automated exposure control, adjustment of the mA and/or kV according to patient size and/or use of iterative reconstruction technique. CONTRAST:  58m OMNIPAQUE IOHEXOL 300 MG/ML  SOLN COMPARISON:  06/30/2022 FINDINGS: Cardiovascular: There are no intraluminal filling defects in central pulmonary artery branches. Evaluation of small peripheral branches is limited by motion artifacts. There is homogeneous enhancement in thoracic aorta. Coronary artery calcifications are seen. Mediastinum/Nodes: There are subcentimeter nodes in mediastinum with  no significant interval change. Lungs/Pleura: Small bilateral pleural effusions are seen, more so on the left side. There are patchy infiltrates in the posterior lower lung fields suggesting atelectasis/pneumonia. There is subtle increase in interstitial markings in parahilar regions which may be an artifact due to motion or suggest mild interstitial edema. There is no pneumothorax. Upper Abdomen: There is fatty infiltration in liver. Musculoskeletal: There is surgical fusion at T1-T2 level. Degenerative changes are noted in thoracic spine. Review of the MIP images confirms the above findings. IMPRESSION: There is no evidence of central pulmonary artery embolism. There is no evidence of thoracic aortic dissection. There are patchy infiltrates in the posterior lower lung fields suggesting atelectasis/pneumonia. Small bilateral pleural effusions. Subtle increase in interstitial markings in parahilar regions may suggest mild CHF. Fatty liver.  Coronary artery calcifications are seen. Electronically Signed   By: Elmer Picker M.D.   On: 08/24/2022 16:09   ECHOCARDIOGRAM COMPLETE  Result Date: 08/23/2022    ECHOCARDIOGRAM REPORT   Patient Name:   GRIFFEY NICASIO Date of Exam: 08/23/2022 Medical Rec #:  [id number]       Height:       67.0 in Accession #:    [id number]      Weight:       233.0 lb Date of Birth:  [birth year]        BSA:          2.158 m Patient  Age:    56 years        BP:           173/101 mmHg Patient Gender: M               HR:           72 bpm. Exam Location:  [facility] Procedure: 2D Echo, Cardiac Doppler and Color Doppler Indications:     CHF-acute diastolic C16.60  History:         Patient has prior history of Echocardiogram examinations, most                  recent 11/04/2020. CHF; Risk Factors:Hypertension and Diabetes.  Sonographer:     Sherrie Sport Referring Phys:  [id number] [place] Diagnosing Phys: Neoma Laming  Sonographer Comments: Image quality was fair. IMPRESSIONS  1. Left ventricular ejection fraction, by estimation, is 60 to 65%. The left ventricle has normal function. The left ventricle has no regional wall motion abnormalities. There is mild left ventricular hypertrophy. Left ventricular diastolic parameters are consistent with Grade II diastolic dysfunction (pseudonormalization).  2. Right ventricular systolic function is normal. The right ventricular size is normal.  3. The mitral valve is normal in structure. Mild mitral valve regurgitation. No evidence of mitral stenosis.  4. The aortic valve is calcified. Aortic valve regurgitation is not visualized. Aortic valve sclerosis/calcification is present, without any evidence of aortic stenosis.  5. The inferior vena cava is normal in size with greater than 50% respiratory variability, suggesting right atrial pressure of 3 mmHg. FINDINGS  Left Ventricle: Left ventricular ejection fraction, by estimation, is 60 to 65%. The left ventricle has normal function. The left ventricle has no regional wall motion abnormalities. The left ventricular internal cavity size was normal in size. There is  mild left ventricular hypertrophy. Left ventricular diastolic parameters are consistent with Grade II diastolic dysfunction (pseudonormalization). Right Ventricle: The right ventricular size is normal. No increase in right ventricular wall thickness. Right ventricular systolic function is normal. Left  Atrium: Left  atrial size was normal in size. Right Atrium: Right atrial size was normal in size. Pericardium: There is no evidence of pericardial effusion. Mitral Valve: The mitral valve is normal in structure. Mild mitral valve regurgitation. No evidence of mitral valve stenosis. Tricuspid Valve: The tricuspid valve is normal in structure. Tricuspid valve regurgitation is mild . No evidence of tricuspid stenosis. Aortic Valve: The aortic valve is calcified. Aortic valve regurgitation is not visualized. Aortic valve sclerosis/calcification is present, without any evidence of aortic stenosis. Aortic valve mean gradient measures 8.0 mmHg. Aortic valve peak gradient measures 14.5 mmHg. Aortic valve area, by VTI measures 1.83 cm. Pulmonic Valve: The pulmonic valve was normal in structure. Pulmonic valve regurgitation is not visualized. No evidence of pulmonic stenosis. Aorta: The aortic root is normal in size and structure. Venous: The inferior vena cava is normal in size with greater than 50% respiratory variability, suggesting right atrial pressure of 3 mmHg. IAS/Shunts: No atrial level shunt detected by color flow Doppler.  LEFT VENTRICLE PLAX 2D LVIDd:         5.30 cm   Diastology LVIDs:         3.50 cm   LV e' medial:    6.42 cm/s LV PW:         1.50 cm   LV E/e' medial:  24.1 LV IVS:        1.50 cm   LV e' lateral:   6.74 cm/s LVOT diam:     2.00 cm   LV E/e' lateral: 23.0 LV SV:         65 LV SV Index:   30 LVOT Area:     3.14 cm  RIGHT VENTRICLE RV Basal diam:  3.40 cm RV Mid diam:    3.00 cm RV S prime:     13.50 cm/s TAPSE (M-mode): 2.6 cm LEFT ATRIUM             Index        RIGHT ATRIUM           Index LA diam:        4.70 cm 2.18 cm/m   RA Area:     13.00 cm LA Vol (A2C):   46.5 ml 21.55 ml/m  RA Volume:   28.10 ml  13.02 ml/m LA Vol (A4C):   25.0 ml 11.59 ml/m LA Biplane Vol: 38.0 ml 17.61 ml/m  AORTIC VALVE AV Area (Vmax):    1.78 cm AV Area (Vmean):   1.73 cm AV Area (VTI):     1.83 cm AV  Vmax:           190.33 cm/s AV Vmean:          130.333 cm/s AV VTI:            0.357 m AV Peak Grad:      14.5 mmHg AV Mean Grad:      8.0 mmHg LVOT Vmax:         108.00 cm/s LVOT Vmean:        71.800 cm/s LVOT VTI:          0.208 m LVOT/AV VTI ratio: 0.58  AORTA Ao Root diam: 3.00 cm MITRAL VALVE                TRICUSPID VALVE MV Area (PHT): 6.37 cm     TR Peak grad:   39.9 mmHg MV Decel Time: 119 msec     TR Vmax:  316.00 cm/s MV E velocity: 155.00 cm/s MV A velocity: 61.80 cm/s   SHUNTS MV E/A ratio:  2.51         Systemic VTI:  0.21 m                             Systemic Diam: 2.00 cm Neoma Laming Electronically signed by Neoma Laming Signature Date/Time: 08/23/2022/2:32:57 PM    Final    DG Chest Portable 1 View  Result Date: 08/23/2022 CLINICAL DATA:  Shortness of breath and cough. EXAM: PORTABLE CHEST 1 VIEW COMPARISON:  08/15/2022 FINDINGS: Normal cardiomediastinal contours. Diffusely increased interstitial markings are identified bilaterally. Cephalized blood flow noted. No airspace consolidation. Cervicothoracic fusion IMPRESSION: Diffusely increased interstitial markings compatible with pulmonary edema. Electronically Signed   By: Kerby Moors M.D.   On: 08/23/2022 06:28   DG Chest Portable 1 View  Result Date: 08/15/2022 CLINICAL DATA:  Shortness of breath EXAM: PORTABLE CHEST 1 VIEW COMPARISON:  07/23/2022 FINDINGS: Diffuse interstitial opacity with Kerley lines and cephalized blood flow. Normal heart size and mediastinal contours. Cervicothoracic fusion. IMPRESSION: Pulmonary edema. Electronically Signed   By: Jorje Guild M.D.   On: 08/15/2022 07:25    Microbiology: Recent Results (from the past 240 hour(s))  Resp Panel by RT-PCR (Flu A&B, Covid) Anterior Nasal Swab     Status: None   Collection Time: 08/23/22  5:34 AM   Specimen: Anterior Nasal Swab  Result Value Ref Range Status   SARS Coronavirus 2 by RT PCR NEGATIVE NEGATIVE Final    Comment: (NOTE) SARS-CoV-2 target  nucleic acids are NOT DETECTED.  The SARS-CoV-2 RNA is generally detectable in upper respiratory specimens during the acute phase of infection. The lowest concentration of SARS-CoV-2 viral copies this assay can detect is 138 copies/mL. A negative result does not preclude SARS-Cov-2 infection and should not be used as the sole basis for treatment or other patient management decisions. A negative result may occur with  improper specimen collection/handling, submission of specimen other than nasopharyngeal swab, presence of viral mutation(s) within the areas targeted by this assay, and inadequate number of viral copies(<138 copies/mL). A negative result must be combined with clinical observations, patient history, and epidemiological information. The expected result is Negative.  Fact Sheet for Patients:  EntrepreneurPulse.com.au  Fact Sheet for Healthcare Providers:  IncredibleEmployment.be  This test is no t yet approved or cleared by the [place] FDA and  has been authorized for detection and/or diagnosis of SARS-CoV-2 by FDA under an Emergency Use Authorization (EUA). This EUA will remain  in effect (meaning this test can be used) for the duration of the COVID-19 declaration under Section 564(b)(1) of the Act, 21 U.S.C.section 360bbb-3(b)(1), unless the authorization is terminated  or revoked sooner.       Influenza A by PCR NEGATIVE NEGATIVE Final   Influenza B by PCR NEGATIVE NEGATIVE Final    Comment: (NOTE) The Xpert Xpress SARS-CoV-2/FLU/RSV plus assay is intended as an aid in the diagnosis of influenza from Nasopharyngeal swab specimens and should not be used as a sole basis for treatment. Nasal washings and aspirates are unacceptable for Xpert Xpress SARS-CoV-2/FLU/RSV testing.  Fact Sheet for Patients: EntrepreneurPulse.com.au  Fact Sheet for Healthcare  Providers: IncredibleEmployment.be  This test is not yet approved or cleared by the [place] FDA and has been authorized for detection and/or diagnosis of SARS-CoV-2 by FDA under an Emergency Use Authorization (EUA). This EUA will remain in  effect (meaning this test can be used) for the duration of the COVID-19 declaration under Section 564(b)(1) of the Act, 21 U.S.C. section 360bbb-3(b)(1), unless the authorization is terminated or revoked.  Performed at [facility], [address] [place], [place] [address]   Expectorated Sputum Assessment w Gram Stain, Rflx to Resp Cult     Status: None   Collection Time: 08/24/22 12:59 PM   Specimen: Sputum  Result Value Ref Range Status   Specimen Description SPUTUM  Final   Special Requests NONE  Final   Sputum evaluation   Final    Sputum specimen not acceptable for testing.  Please recollect.   C/BIANCA BROWNING AT 0932 08/24/22.PMF Performed at [facility], [address] [place], [place] [address]    Report Status 08/24/2022 FINAL  Final     Labs: Basic Metabolic Panel: Recent Labs  Lab 08/23/22 0534 08/24/22 0458 08/25/22 0535  NA 139 140 140  140  K 3.7 3.8 3.5  3.5  CL 104 105 106  105  CO2 '23 28 25  26  '$ GLUCOSE 198* 147* 113*  115*  BUN 14 18 28*  28*  CREATININE 1.24 1.40* 1.68*  1.64*  CALCIUM 8.6* 8.2* 8.7*  8.6*  PHOS  --  4.5 4.6   Liver Function Tests: Recent Labs  Lab 08/23/22 0534 08/24/22 0458 08/25/22 0535  AST 25  --   --   ALT 21  --   --   ALKPHOS 68  --   --   BILITOT 1.2  --   --   PROT 7.3  --   --   ALBUMIN 3.7 3.5 3.5   No results for input(s): "LIPASE", "AMYLASE" in the last 168 hours. No results for input(s): "AMMONIA" in the last 168 hours. CBC: Recent Labs  Lab 08/23/22 0534 08/24/22 0458 08/25/22 0535  WBC 10.7* 9.7 12.3*  NEUTROABS 8.0*  --   --   HGB 13.9 13.2 14.0  HCT 42.6 41.3 42.7  MCV 85.0 85.5 83.4  PLT 216 193 265    Cardiac Enzymes: No results for input(s): "CKTOTAL", "CKMB", "CKMBINDEX", "TROPONINI" in the last 168 hours. BNP: BNP (last 3 results) Recent Labs    06/08/22 1534 08/15/22 0709 08/23/22 0534  BNP 161.5* 162.7* 164.0*    ProBNP (last 3 results) No results for input(s): "PROBNP" in the last 8760 hours.  CBG: Recent Labs  Lab 08/24/22 1220 08/24/22 1651 08/24/22 2128 08/25/22 0733 08/25/22 1221  GLUCAP 117* 169* 240* 121* 262*       Signed:  Desma Maxim MD.  [facility] 08/25/2022, 12:50 PM

## 2022-08-25 NOTE — Plan of Care (Signed)

## 2022-08-27 ENCOUNTER — Telehealth: Payer: Self-pay

## 2022-08-27 LAB — LEGIONELLA PNEUMOPHILA SEROGP 1 UR AG: L. pneumophila Serogp 1 Ur Ag: NEGATIVE

## 2022-08-28 ENCOUNTER — Encounter: Payer: Self-pay | Admitting: Family Medicine

## 2022-08-28 ENCOUNTER — Ambulatory Visit: Payer: Medicaid Other | Admitting: Family Medicine

## 2022-08-28 ENCOUNTER — Other Ambulatory Visit: Payer: Self-pay | Admitting: Family Medicine

## 2022-08-28 VITALS — BP 138/74 | HR 78 | Temp 98.2°F | Resp 16 | Ht 66.0 in | Wt 231.9 lb

## 2022-08-28 DIAGNOSIS — J9601 Acute respiratory failure with hypoxia: Secondary | ICD-10-CM

## 2022-08-28 DIAGNOSIS — R0602 Shortness of breath: Secondary | ICD-10-CM | POA: Diagnosis not present

## 2022-08-28 DIAGNOSIS — Z09 Encounter for follow-up examination after completed treatment for conditions other than malignant neoplasm: Secondary | ICD-10-CM | POA: Diagnosis not present

## 2022-08-28 DIAGNOSIS — Z720 Tobacco use: Secondary | ICD-10-CM

## 2022-08-28 DIAGNOSIS — R0682 Tachypnea, not elsewhere classified: Secondary | ICD-10-CM | POA: Diagnosis not present

## 2022-08-28 DIAGNOSIS — I517 Cardiomegaly: Secondary | ICD-10-CM

## 2022-08-28 DIAGNOSIS — I5033 Acute on chronic diastolic (congestive) heart failure: Secondary | ICD-10-CM | POA: Diagnosis not present

## 2022-08-28 DIAGNOSIS — M1A09X0 Idiopathic chronic gout, multiple sites, without tophus (tophi): Secondary | ICD-10-CM | POA: Diagnosis not present

## 2022-08-28 DIAGNOSIS — Z716 Tobacco abuse counseling: Secondary | ICD-10-CM | POA: Diagnosis not present

## 2022-08-28 DIAGNOSIS — N179 Acute kidney failure, unspecified: Secondary | ICD-10-CM

## 2022-08-28 DIAGNOSIS — R0600 Dyspnea, unspecified: Secondary | ICD-10-CM

## 2022-08-28 DIAGNOSIS — I10 Essential (primary) hypertension: Secondary | ICD-10-CM

## 2022-08-28 MED ORDER — POTASSIUM CHLORIDE CRYS ER 20 MEQ PO TBCR: 20.0000 meq | EXTENDED_RELEASE_TABLET | Freq: Every day | ORAL | 3 refills | Status: DC

## 2022-08-28 NOTE — Telephone Encounter (Signed)
Cory Rivas from [name] is calling back about the furosemide refill he already requested and was wondering by any chance they can hear back by tomorrow as they bubblepack his meds. He realizes there is a 92-11 hr policy and doesn't want to cause problems but rather just help the patient. Please assist further

## 2022-08-28 NOTE — Telephone Encounter (Signed)
Patient seen in office today Cory Rivas, [place] Direct Dial [phone]

## 2022-08-28 NOTE — Progress Notes (Unsigned)
Name: Cory Rivas   MRN: [id number]    DOB: 1966/10/13   Date:08/28/2022       Progress Note  Chief Complaint  Patient presents with   Hospitalization Follow-up    Diagnosed with: Acute on chronic diastolic congestive heart failure ([place]) Needs kidney function checked     Subjective:   Cory Rivas is a 56 y.o. male, presents to clinic for [place]  Admitted on 08/23/22-08/25/22  Acute hypoxic respiratory failure secondary to acute fluid overload CHF/grade 2 diastolic dysfunction, uncontrolled HTN, and asthma/copd  He is on lasix daily now and finished prednisone yesterday 40 mg  He is holding lisinopril 40 mg until labs eGFR was >60 when admitted and down to 48  Labs and PCP f/up to be one week after d/c    ***   Current Outpatient Medications:    acetaminophen (TYLENOL) 325 MG tablet, SMARTSIG:3 Tablet(s) By Mouth Every 8 Hours PRN, Disp: , Rfl:    allopurinol (ZYLOPRIM) 100 MG tablet, TAKE 1 TABLET BY MOUTH ONCE A DAY. TAKE WITH 300 MG TABLETS, Disp: 90 tablet, Rfl: 0   allopurinol (ZYLOPRIM) 300 MG tablet, Take one 300 mg tab po daily WITH 100 mg tab for total of 400 mg once daily for gout, Disp: 90 tablet, Rfl: 3   amLODipine (NORVASC) 10 MG tablet, Take 1 tablet (10 mg total) by mouth daily., Disp: 90 tablet, Rfl: 3   benzonatate (TESSALON PERLES) 100 MG capsule, Take 1 capsule (100 mg total) by mouth 3 (three) times daily as needed for cough., Disp: 30 capsule, Rfl: 0   budesonide-formoterol (SYMBICORT) 160-4.5 MCG/ACT inhaler, Inhale 2 puffs into the lungs 2 (two) times daily., Disp: 1 each, Rfl: 12   DULoxetine (CYMBALTA) 30 MG capsule, Take 1 capsule (30 mg total) by mouth 2 (two) times daily., Disp: 180 capsule, Rfl: 1   ezetimibe (ZETIA) 10 MG tablet, Take 1 tablet (10 mg total) by mouth daily., Disp: 90 tablet, Rfl: 3   fluticasone (FLONASE) 50 MCG/ACT nasal spray, Place 1 spray into both nostrils daily., Disp: 18.2 mL, Rfl: 6   furosemide (LASIX) 20 MG tablet,  Take 1 tablet (20 mg total) by mouth daily., Disp: 30 tablet, Rfl: 0   hydrALAZINE (APRESOLINE) 100 MG tablet, Take 1 tablet (100 mg total) by mouth 3 (three) times daily. Monitor BP, Disp: 90 tablet, Rfl: 1   hydrOXYzine (VISTARIL) 25 MG capsule, Take 1 capsule (25 mg total) by mouth every 8 (eight) hours as needed for itching., Disp: 30 capsule, Rfl: 1   indomethacin (INDOCIN) 25 MG capsule, TAKE 2 TABLETS (50 MG) BY 3 TIMES DAILY FOR 2 DAYS AT ONSET OF GOUT FLARE UP, THEN TAKE 1(25MG)TABLET BY MOUTH FOR 3 DAYS, Disp: 20 capsule, Rfl: 3   insulin lispro (HUMALOG) 100 UNIT/ML KwikPen, Inject 1-45 Units into the skin 3 (three) times daily. Sliding scale up to 45 units per injection, Disp: , Rfl:    ipratropium-albuterol (DUONEB) 0.5-2.5 (3) MG/3ML SOLN, Take 3 mLs by nebulization every 6 (six) hours as needed (SOB wheeze cough)., Disp: 180 mL, Rfl: 1   levocetirizine (XYZAL) 5 MG tablet, Take 1 tablet (5 mg total) by mouth daily., Disp: 30 tablet, Rfl: 11   metFORMIN (GLUCOPHAGE) 1000 MG tablet, TAKE 1 TABLET BY MOUTH TWICE A DAY WITH A MEAL, Disp: 180 tablet, Rfl: 3   nicotine (NICODERM CQ - DOSED IN MG/24 HOURS) 21 mg/24hr patch, Place 1 patch (21 mg total) onto the skin daily., Disp: 42  patch, Rfl: 0   nicotine polacrilex (NICOTINE MINI) 2 MG lozenge, Take 1 lozenge (2 mg total) by mouth every 2 (two) hours as needed for smoking cessation., Disp: 72 lozenge, Rfl: 3   Olopatadine HCl 0.2 % SOLN, Apply 1 drop to eye 2 (two) times daily as needed (eye allergies)., Disp: 2.5 mL, Rfl: 3   predniSONE (DELTASONE) 20 MG tablet, Take 2 tablets (40 mg total) by mouth daily with breakfast., Disp: 4 tablet, Rfl: 0   pregabalin (LYRICA) 25 MG capsule, TAKE 1 CAPSULE BY MOUTH IN THE MORNING AND 1 TO 3 CAPSULES EVERY NIGHT FOR CERVICAL RADICULOPATHY AND NEUROPATHY, Disp: 270 capsule, Rfl: 1   rosuvastatin (CRESTOR) 40 MG tablet, Take 1 tablet (40 mg total) by mouth daily., Disp: 90 tablet, Rfl: 3   triamcinolone  ointment (KENALOG) 0.5 %, Apply 1 application. topically 2 (two) times daily., Disp: 30 g, Rfl: 0   TRULICITY 3 YI/0.1KP SOPN, Inject into the skin., Disp: , Rfl:    VENTOLIN HFA 108 (90 Base) MCG/ACT inhaler, INHALE 2 PUFFS INTO THE LUNGS EVERY 6 HOURS AS NEEDED FOR WHEEZING OR SHORTNESS OF BREATH, Disp: 18 g, Rfl: 2   Baclofen 5 MG TABS, Take 5-10 mg by mouth 3 (three) times daily as needed (msk pain or spasms). (Patient not taking: Reported on 08/15/2022), Disp: 90 tablet, Rfl: 3   budesonide-formoterol (SYMBICORT) 160-4.5 MCG/ACT inhaler, Inhale 2 puffs into the lungs 2 (two) times daily. And can use 2 puffs prn up to every 6 hours for wheeze, SOB (Patient not taking: Reported on 08/23/2022), Disp: 1 each, Rfl: 3   Dulaglutide 1.5 MG/0.5ML SOPN, Inject into the skin. (Patient not taking: Reported on 08/23/2022), Disp: , Rfl:    insulin glargine (LANTUS SOLOSTAR) 100 UNIT/ML Solostar Pen, Inject 20 Units into the skin at bedtime., Disp: , Rfl:   Patient Active Problem List   Diagnosis Date Noted   CHF exacerbation ([place]) 08/24/2022   Acute on chronic diastolic congestive heart failure ([place]) 08/23/2022   Class 2 obesity 08/23/2022   Type 2 diabetes mellitus with hyperglycemia ([place]) 08/23/2022   Grade I diastolic dysfunction 53/74/8270   LVH (left ventricular hypertrophy) 06/08/2022   Accelerated hypertension 06/08/2022   S/P cervical spinal fusion 08/18/2021   Former heavy tobacco smoker 08/18/2021   Gout 08/18/2021   Other chronic pain 78/67/5449   Systolic murmur 20/07/711   Tobacco use 10/26/2020   Polyp of ascending colon    Rectal polyp    Class 2 severe obesity with serious comorbidity and body mass index (BMI) of 39.0 to 39.9 in adult [facility]) 04/15/2019   Chronic kidney disease (CKD) stage G2/A3, mildly decreased glomerular filtration rate (GFR) between 60-89 mL/min/1.73 square meter and albuminuria creatinine ratio greater than 300 mg/g 04/15/2019   Proteinuria 04/15/2019    Hyperlipidemia associated with type 2 diabetes mellitus ([place]) 03/18/2019   Diabetes mellitus ([place]) 03/18/2019   Idiopathic chronic gout of multiple sites without tophus 03/18/2019   Essential hypertension 03/18/2019   Dermatitis 03/18/2019   Lumbar degenerative disc disease 11/22/2015   Cervical myelopathy ([place]) 11/22/2015   Osteoarthritis of spine with radiculopathy, lumbar region 11/22/2015    Past Surgical History:  Procedure Laterality Date   CHOLECYSTECTOMY     COLONOSCOPY WITH PROPOFOL N/A 03/10/2020   Procedure: COLONOSCOPY WITH PROPOFOL;  Surgeon: Lucilla Lame, MD;  Location: [facility] ENDOSCOPY;  Service: Endoscopy;  Laterality: N/A;    Family History  Problem Relation Age of Onset   Heart disease Mother  Diabetes Mother    Hyperlipidemia Mother    Hypertension Mother    Heart attack Mother    Lung cancer Father    Hypertension Father    Stroke Father    AAA (abdominal aortic aneurysm) Maternal Grandmother    Heart attack Maternal Grandmother    Diabetes Maternal Grandfather     Social History   Tobacco Use   Smoking status: Every Day    Packs/day: 1.00    Years: 37.00    Total pack years: 37.00    Types: Cigarettes    Start date: 10/19/1989   Smokeless tobacco: Never   Tobacco comments:    30+ years hx smoking 2 ppd, stopped smoking briefly in April 2022 for a surgery and then restarted smoking about 1/2 ppd last 4-5 months   Vaping Use   Vaping Use: Never used  Substance Use Topics   Alcohol use: No   Drug use: No     Allergies  Allergen Reactions   Oxycodone Nausea And Vomiting   Onion Nausea And Vomiting    Health Maintenance  Topic Date Due   COVID-19 Vaccine (4 - Pfizer series) 09/13/2022 (Originally 09/07/2020)   Zoster Vaccines- Shingrix (1 of 2) 10/23/2022 (Originally 02/12/2017)   OPHTHALMOLOGY EXAM  02/06/2023   HEMOGLOBIN A1C  02/21/2023   COLONOSCOPY (Pts 45-20yr Insurance coverage will need to be confirmed)  03/11/2023   Diabetic kidney  evaluation - Urine ACR  06/28/2023   FOOT EXAM  08/10/2023   Lung Cancer Screening  08/25/2023   Diabetic kidney evaluation - GFR measurement  08/26/2023   TETANUS/TDAP  04/14/2029   INFLUENZA VACCINE  Completed   Hepatitis C Screening  Completed   HIV Screening  Completed   HPV VACCINES  Aged Out    Chart Review Today: ***  Review of Systems   Objective:   Vitals:   08/28/22 1045 08/28/22 1104  BP: (!) 148/84 (!) 154/84  Pulse: 78   Resp: 16   Temp: 98.2 F (36.8 C)   TempSrc: Oral   SpO2: 98%   Weight: 231 lb 14.4 oz (105.2 kg)   Height: _0  (1.676 m)     Body mass index is 37.43 kg/m.  Physical Exam      Assessment & Plan:   Problem List Items Addressed This Visit   None    No follow-ups on file.   LDelsa Grana PA-C 08/28/22 11:23 AM

## 2022-08-28 NOTE — Patient Instructions (Signed)
Come Thursday for nurse check - vital signs and labs

## 2022-08-30 ENCOUNTER — Ambulatory Visit: Payer: Medicaid Other

## 2022-08-30 ENCOUNTER — Encounter: Payer: Self-pay | Admitting: Family Medicine

## 2022-08-30 VITALS — BP 142/78 | HR 70 | Resp 18 | Ht 67.0 in | Wt 230.8 lb

## 2022-08-30 DIAGNOSIS — N179 Acute kidney failure, unspecified: Secondary | ICD-10-CM | POA: Diagnosis not present

## 2022-08-30 DIAGNOSIS — Z09 Encounter for follow-up examination after completed treatment for conditions other than malignant neoplasm: Secondary | ICD-10-CM | POA: Diagnosis not present

## 2022-08-30 DIAGNOSIS — I5033 Acute on chronic diastolic (congestive) heart failure: Secondary | ICD-10-CM | POA: Diagnosis not present

## 2022-08-30 DIAGNOSIS — I10 Essential (primary) hypertension: Secondary | ICD-10-CM

## 2022-08-30 MED ORDER — ALLOPURINOL 100 MG PO TABS: ORAL_TABLET | ORAL | 1 refills | Status: DC

## 2022-08-30 MED ORDER — NICOTINE POLACRILEX 2 MG MT LOZG: 2.0000 mg | LOZENGE | OROMUCOSAL | 3 refills | Status: DC | PRN

## 2022-08-30 MED ORDER — ALLOPURINOL 300 MG PO TABS: ORAL_TABLET | ORAL | 1 refills | Status: DC

## 2022-08-30 NOTE — Progress Notes (Signed)
Did you get RR, pulse Ox and put everything into the VS boxes?  I did need full VS with his recent respiratory distress, hypoxia and heart and lung issues (its not pulling in all VS even though you wrote several of them in the note).  After I see his labs I will let him know about restarting the lisinopril - we would like to have him back on it but I may need to adjust the dose I will figure it out tomorrow Thanks Tesslyn Baumert   BP mildly elevated-  still holding lisinopril 40  BP Readings from Last 3 Encounters:  08/30/22 (!) 142/78  08/28/22 138/74  08/25/22 130/68   Full vitals??    08/30/2022    9:01 AM 08/30/2022    8:50 AM 08/28/2022   11:45 AM  Vitals with BMI  Height  '5\' 7"'$    Weight  230 lbs 13 oz   BMI  73.22   Systolic 025 427 062  Diastolic 78 84 74  Pulse  70     Pulse normal No RR I don't think   Weight stable, down 1 lb Wt Readings from Last 5 Encounters:  08/30/22 230 lb 12.8 oz (104.7 kg)  08/28/22 231 lb 14.4 oz (105.2 kg)  08/25/22 231 lb 11.3 oz (105.1 kg)  08/15/22 236 lb (107 kg)  08/07/22 236 lb (107 kg)   BMI Readings from Last 5 Encounters:  08/30/22 36.15 kg/m  08/28/22 37.43 kg/m  08/25/22 36.29 kg/m  08/15/22 38.09 kg/m  08/07/22 38.09 kg/m   Pt will be called tomorrow with med changes after lab results are reviewed

## 2022-08-30 NOTE — Progress Notes (Signed)
Patient  here for vital signs and labs. Weight was 230.8, pulse 70 o2 98 and BP 142/84.  According to last note and patient continuing the lasix and holding the lisinopril.  Blood pressure recheck was 142/78.

## 2022-08-30 NOTE — Addendum Note (Signed)
Addended by: Delsa Grana on: 08/30/2022 07:38 PM   Modules accepted: Orders

## 2022-08-31 ENCOUNTER — Telehealth: Payer: Self-pay

## 2022-08-31 ENCOUNTER — Telehealth: Payer: Self-pay | Admitting: Family Medicine

## 2022-08-31 LAB — CBC WITH DIFFERENTIAL/PLATELET
Absolute Monocytes: 770 cells/uL (ref 200–950)
Basophils Absolute: 56 cells/uL (ref 0–200)
Basophils Relative: 0.4 %
Eosinophils Absolute: 154 cells/uL (ref 15–500)
Eosinophils Relative: 1.1 %
HCT: 46.5 % (ref 38.5–50.0)
Hemoglobin: 15.7 g/dL (ref 13.2–17.1)
Lymphs Abs: 2478 cells/uL (ref 850–3900)
MCH: 28.2 pg (ref 27.0–33.0)
MCHC: 33.8 g/dL (ref 32.0–36.0)
MCV: 83.6 fL (ref 80.0–100.0)
MPV: 11 fL (ref 7.5–12.5)
Monocytes Relative: 5.5 %
Neutro Abs: 10542 cells/uL — ABNORMAL HIGH (ref 1500–7800)
Neutrophils Relative %: 75.3 %
Platelets: 234 10*3/uL (ref 140–400)
RBC: 5.56 10*6/uL (ref 4.20–5.80)
RDW: 13.5 % (ref 11.0–15.0)
Total Lymphocyte: 17.7 %
WBC: 14 10*3/uL — ABNORMAL HIGH (ref 3.8–10.8)

## 2022-08-31 LAB — COMPLETE METABOLIC PANEL WITH GFR
AG Ratio: 1.4 (calc) (ref 1.0–2.5)
ALT: 27 U/L (ref 9–46)
AST: 21 U/L (ref 10–35)
Albumin: 4.2 g/dL (ref 3.6–5.1)
Alkaline phosphatase (APISO): 72 U/L (ref 35–144)
BUN/Creatinine Ratio: 17 (calc) (ref 6–22)
BUN: 22 mg/dL (ref 7–25)
CO2: 31 mmol/L (ref 20–32)
Calcium: 9.2 mg/dL (ref 8.6–10.3)
Chloride: 98 mmol/L (ref 98–110)
Creat: 1.32 mg/dL — ABNORMAL HIGH (ref 0.70–1.30)
Globulin: 3.1 g/dL (calc) (ref 1.9–3.7)
Glucose, Bld: 125 mg/dL — ABNORMAL HIGH (ref 65–99)
Potassium: 4.4 mmol/L (ref 3.5–5.3)
Sodium: 139 mmol/L (ref 135–146)
Total Bilirubin: 0.9 mg/dL (ref 0.2–1.2)
Total Protein: 7.3 g/dL (ref 6.1–8.1)
eGFR: 64 mL/min/{1.73_m2} (ref >=60)

## 2022-08-31 NOTE — Telephone Encounter (Signed)
Copied from [place] [id number]. Topic: Quick Communication - Lab Results (Clinic Use ONLY) >> Aug 31, 2022 11:01 AM Maricela R wrote: Called patient to inform them of recent lab results. When patient returns call, triage nurse may disclose results.

## 2022-08-31 NOTE — Telephone Encounter (Signed)
Already given.

## 2022-08-31 NOTE — Telephone Encounter (Signed)
Called pt both numbers no answer left vm on sister phone to return call.

## 2022-08-31 NOTE — Telephone Encounter (Signed)
Pt notified- verbalized understanding.

## 2022-08-31 NOTE — Telephone Encounter (Signed)
Per your request through result note.  Copied from [place] [id number]. Topic: General - Inquiry >> Aug 31, 2022  1:31 PM Rosanne Ashing P wrote: Reason for CRM: Pt called with his BP reading 169-84.  He is calling from his sisters phone and does not know the number. He was called and ask to call the office with his blood pressure

## 2022-09-04 NOTE — Addendum Note (Signed)
Addended by: Claudette Head A on: 09/04/2022 03:09 PM   Modules accepted: Orders

## 2022-09-09 NOTE — Progress Notes (Signed)
Patient ID: Cory Rivas, male    DOB: 1966-10-07, 56 y.o.   MRN: [id number]  HPI  Cory Rivas is a 56 y/o male with a history of DM, hyperlipidemia, HTN, gout, tobacco use and chronic heart failure.   Echo report from 08/23/22 showed an EF of 60-65% along with mild LVH & mild Cory.   Admitted 08/23/22 due to dyspnea, productive cough of brown sputum, mild sore throat, fatigue, dizziness and chest soreness. Steroid burst given. Initially given IV lasix with transition to oral diuretics. Lisinopril held due to AKI. Discharged after 2 days. Was in the ED 08/15/22 due to acute pulmonary edema where he was treated and released.    He presents today for his initial visit with a chief complaint of minimal fatigue upon moderate exertion. Describes this as chronic in nature. He has associated shortness of breath, palpitations and chronic difficulty sleeping along with this. He denies any dizziness, abdominal distention, pedal edema, chest pain, cough or weight gain.   Weighing daily and not adding any salt to his food. Trying to read food labels for sodium content. Is asking about a referral to exercise program.   Past Medical History:  Diagnosis Date   Bulging of cervical intervertebral disc    CHF (congestive heart failure) (HCC)    Diabetes mellitus without complication (H[facility])    Gout    High cholesterol    Hypertension    Stress due to family tension 04/15/2019   Past Surgical History:  Procedure Laterality Date   CHOLECYSTECTOMY     COLONOSCOPY WITH PROPOFOL N/A 03/10/2020   Procedure: COLONOSCOPY WITH PROPOFOL;  Surgeon: Lucilla Lame, MD;  Location: [facility] ENDOSCOPY;  Service: Endoscopy;  Laterality: N/A;   Family History  Problem Relation Age of Onset   Heart disease Mother    Diabetes Mother    Hyperlipidemia Mother    Hypertension Mother    Heart attack Mother    Lung cancer Father    Hypertension Father    Stroke Father    AAA (abdominal aortic aneurysm) Maternal Grandmother     Heart attack Maternal Grandmother    Diabetes Maternal Grandfather    Social History   Tobacco Use   Smoking status: Former    Packs/day: 1.00    Years: 37.00    Total pack years: 37.00    Types: Cigarettes    Start date: 10/19/1989    Quit date: 08/20/2022    Years since quitting: 0.0   Smokeless tobacco: Never   Tobacco comments:    30+ years hx smoking 2 ppd, stopped smoking briefly in April 2022 for a surgery and then restarted smoking about 1/2 ppd last 4-5 months   Substance Use Topics   Alcohol use: No   Allergies  Allergen Reactions   Oxycodone Nausea And Vomiting   Onion Nausea And Vomiting   Prior to Admission medications   Medication Sig Start Date End Date Taking? Authorizing Provider  allopurinol (ZYLOPRIM) 100 MG tablet TAKE 1 TABLET BY MOUTH ONCE A DAY. TAKE WITH 300 MG TABLETS 08/30/22  Yes Delsa Grana, PA-C  allopurinol (ZYLOPRIM) 300 MG tablet Take one 300 mg tab po daily WITH 100 mg tab for total of 400 mg once daily for gout 08/30/22  Yes Delsa Grana, PA-C  amLODipine (NORVASC) 10 MG tablet Take 1 tablet (10 mg total) by mouth daily. 01/26/22  Yes Delsa Grana, PA-C  budesonide-formoterol (SYMBICORT) 160-4.5 MCG/ACT inhaler Inhale 2 puffs into the lungs 2 (two) times daily.  08/01/22  Yes Dgayli, Berdine Addison, MD  DULoxetine (CYMBALTA) 30 MG capsule Take 1 capsule (30 mg total) by mouth 2 (two) times daily. 03/06/22  Yes Delsa Grana, PA-C  ezetimibe (ZETIA) 10 MG tablet Take 1 tablet (10 mg total) by mouth daily. 03/06/22  Yes Delsa Grana, PA-C  fluticasone (FLONASE) 50 MCG/ACT nasal spray Place 1 spray into both nostrils daily. 08/01/22 08/01/23 Yes Dgayli, Berdine Addison, MD  furosemide (LASIX) 20 MG tablet Take 1 tablet (20 mg total) by mouth in the morning. Take with potassium supplement 08/28/22  Yes Delsa Grana, PA-C  hydrALAZINE (APRESOLINE) 100 MG tablet Take 1 tablet (100 mg total) by mouth 3 (three) times daily. Monitor BP 08/07/22  Yes Agbor-Etang, Aaron Edelman, MD   indomethacin (INDOCIN) 25 MG capsule TAKE 2 TABLETS (50 MG) BY 3 TIMES DAILY FOR 2 DAYS AT ONSET OF GOUT FLARE UP, THEN TAKE 1('25MG'$ )TABLET BY MOUTH FOR 3 DAYS 09/05/21  Yes Delsa Grana, PA-C  insulin glargine (LANTUS SOLOSTAR) 100 UNIT/ML Solostar Pen Inject 20 Units into the skin at bedtime. 11/01/20  Yes [provider]  insulin lispro (HUMALOG) 100 UNIT/ML KwikPen Inject 1-45 Units into the skin 3 (three) times daily. Sliding scale up to 45 units per injection 11/08/20  Yes [provider]  levocetirizine (XYZAL) 5 MG tablet Take 1 tablet (5 mg total) by mouth daily. 08/01/22 08/01/23 Yes Dgayli, Berdine Addison, MD  lisinopril (ZESTRIL) 40 MG tablet Take 20 mg by mouth daily.   Yes [provider]  metFORMIN (GLUCOPHAGE) 1000 MG tablet TAKE 1 TABLET BY MOUTH TWICE A DAY WITH A MEAL 03/06/22  Yes Delsa Grana, PA-C  nicotine polacrilex (NICOTINE MINI) 2 MG lozenge Take 1 lozenge (2 mg total) by mouth every 2 (two) hours as needed for smoking cessation. 08/30/22 11/28/22 Yes Delsa Grana, PA-C  potassium chloride SA (KLOR-CON M) 20 MEQ tablet Take 1 tablet (20 mEq total) by mouth daily. Take with furosemide 08/28/22  Yes Delsa Grana, PA-C  predniSONE (DELTASONE) 20 MG tablet Take 2 tablets (40 mg total) by mouth daily with breakfast. 08/26/22  Yes Wouk, Ailene Rud, MD  pregabalin (LYRICA) 25 MG capsule TAKE 1 CAPSULE BY MOUTH IN THE MORNING AND 1 TO 3 CAPSULES EVERY NIGHT FOR CERVICAL RADICULOPATHY AND NEUROPATHY 07/10/22  Yes Delsa Grana, PA-C  rosuvastatin (CRESTOR) 40 MG tablet Take 1 tablet (40 mg total) by mouth daily. 01/26/22  Yes Delsa Grana, PA-C  TRULICITY 3 NI/6.2VO SOPN Inject into the skin. 03/26/22  Yes [provider]  VENTOLIN HFA 108 (90 Base) MCG/ACT inhaler INHALE 2 PUFFS INTO THE LUNGS EVERY 6 HOURS AS NEEDED FOR WHEEZING OR SHORTNESS OF BREATH 04/05/22  Yes Delsa Grana, PA-C  ipratropium-albuterol (DUONEB) 0.5-2.5 (3) MG/3ML SOLN Take 3 mLs by  nebulization every 6 (six) hours as needed (SOB wheeze cough). Patient not taking: Reported on 09/10/2022 07/23/22   Delsa Grana, PA-C  Olopatadine HCl 0.2 % SOLN Apply 1 drop to eye 2 (two) times daily as needed (eye allergies). Patient not taking: Reported on 09/10/2022 01/29/22   Delsa Grana, PA-C   Review of Systems  Constitutional:  Positive for fatigue (minimal). Negative for appetite change.  HENT:  Negative for congestion, postnasal drip and sore throat.   Eyes: Negative.   Respiratory:  Positive for shortness of breath (minimal). Negative for cough, chest tightness and wheezing.   Cardiovascular:  Positive for palpitations (at times). Negative for chest pain and leg swelling.  Gastrointestinal:  Negative for abdominal distention and abdominal pain.  Endocrine: Negative.   Genitourinary: Negative.   Musculoskeletal:  Positive for neck pain. Negative for back pain.  Skin: Negative.   Allergic/Immunologic: Negative.   Neurological:  Negative for dizziness and light-headedness.  Hematological:  Negative for adenopathy. Does not bruise/bleed easily.  Psychiatric/Behavioral:  Positive for sleep disturbance (trouble sleeping due to neck tightness; sleeping on couch due to comfort). Negative for dysphoric mood. The patient is not nervous/anxious.     Vitals:   09/10/22 1029  BP: 131/75  Pulse: 67  Resp: 20  SpO2: 98%  Weight: 226 lb (102.5 kg)   Wt Readings from Last 3 Encounters:  09/10/22 226 lb (102.5 kg)  08/30/22 230 lb 12.8 oz (104.7 kg)  08/28/22 231 lb 14.4 oz (105.2 kg)   Lab Results  Component Value Date   CREATININE 1.32 (H) 08/30/2022   CREATININE 1.68 (H) 08/25/2022   CREATININE 1.64 (H) 08/25/2022   Physical Exam Vitals and nursing note reviewed.  Constitutional:      Appearance: He is well-developed.  HENT:     Head: Normocephalic and atraumatic.  Neck:     Vascular: No JVD.  Cardiovascular:     Rate and Rhythm: Normal rate and regular rhythm.   Pulmonary:     Effort: Pulmonary effort is normal.     Breath sounds: No wheezing, rhonchi or rales.  Abdominal:     Palpations: Abdomen is soft.     Tenderness: There is no abdominal tenderness.  Musculoskeletal:     Cervical back: Normal range of motion and neck supple.     Right lower leg: No tenderness. No edema.     Left lower leg: No tenderness. No edema.  Skin:    General: Skin is warm and dry.  Neurological:     General: No focal deficit present.     Mental Status: He is alert and oriented to person, place, and time.  Psychiatric:        Mood and Affect: Mood normal.        Behavior: Behavior normal.    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)- - NYHA class II - euvolemic today - weighing daily; reminded to call for an overnight weight gain of >2 pounds or a weekly weight gain of > 5 pounds - on GDMT of lisinopril (recently resumed) - will check BMP today since lisinopril has been resumed - discussed possible changing lisinopril to entresto and adding SGLT2 but want to make sure his current list is accurate - he uses pill packs so can't say with certainty what he's taking and he didn't bring them with him - saw cardiology (Agbor-Etang) 08/07/22 - placed pulmonary rehab referral - BNP 08/23/22 was 164.0 - has received his flu vaccine for this season  2: HTN- - BP looks good (131/75); has not taken his meds yet this morning - saw PCP Lucio Edward) 08/28/22 - BMP 08/30/22 reviewed and showed sodium 139, potassium 4.4, creatinine 1.32 and GFR 64  3: DM- - A1c 08/23/22 was 7.5% - currently on trulicity, metformin and insulin - home glucose this morning was 102  4: Reactive airway disease- - saw pulmonology (Dgayli) 08/01/22 - using symbicort and xyzal - no longer smoking and is using nicotine lozenges and says this is working well for him.    Patient did not bring his medications nor a list. Each  medication was verbally reviewed with the patient and he was encouraged to bring the bottles to  every visit to confirm accuracy of list.  Return in 6 weeks, sooner if needed.

## 2022-09-10 ENCOUNTER — Other Ambulatory Visit: Payer: Self-pay | Admitting: *Deleted

## 2022-09-10 ENCOUNTER — Encounter: Payer: Self-pay | Admitting: Student in an Organized Health Care Education/Training Program

## 2022-09-10 ENCOUNTER — Other Ambulatory Visit
Admission: RE | Admit: 2022-09-10 | Discharge: 2022-09-10 | Disposition: A | Discharge status: Home / Self Care | Payer: Medicaid Other | Source: Ambulatory Visit | Attending: Family | Admitting: Family

## 2022-09-10 ENCOUNTER — Ambulatory Visit (HOSPITAL_BASED_OUTPATIENT_CLINIC_OR_DEPARTMENT_OTHER): Payer: Medicaid Other | Admitting: Family

## 2022-09-10 ENCOUNTER — Encounter: Payer: Self-pay | Admitting: Family

## 2022-09-10 VITALS — BP 131/75 | HR 67 | Resp 20 | Wt 226.0 lb

## 2022-09-10 DIAGNOSIS — Z794 Long term (current) use of insulin: Secondary | ICD-10-CM | POA: Insufficient documentation

## 2022-09-10 DIAGNOSIS — Z79899 Other long term (current) drug therapy: Secondary | ICD-10-CM | POA: Insufficient documentation

## 2022-09-10 DIAGNOSIS — E119 Type 2 diabetes mellitus without complications: Secondary | ICD-10-CM

## 2022-09-10 DIAGNOSIS — I10 Essential (primary) hypertension: Secondary | ICD-10-CM | POA: Diagnosis not present

## 2022-09-10 DIAGNOSIS — I5032 Chronic diastolic (congestive) heart failure: Secondary | ICD-10-CM

## 2022-09-10 DIAGNOSIS — I110 Hypertensive heart disease with heart failure: Secondary | ICD-10-CM | POA: Insufficient documentation

## 2022-09-10 DIAGNOSIS — J45909 Unspecified asthma, uncomplicated: Secondary | ICD-10-CM

## 2022-09-10 DIAGNOSIS — Z87891 Personal history of nicotine dependence: Secondary | ICD-10-CM | POA: Insufficient documentation

## 2022-09-10 DIAGNOSIS — Z7984 Long term (current) use of oral hypoglycemic drugs: Secondary | ICD-10-CM | POA: Insufficient documentation

## 2022-09-10 DIAGNOSIS — Z7951 Long term (current) use of inhaled steroids: Secondary | ICD-10-CM | POA: Insufficient documentation

## 2022-09-10 LAB — BASIC METABOLIC PANEL
Anion gap: 10 (ref 5–15)
BUN: 23 mg/dL — ABNORMAL HIGH (ref 6–20)
CO2: 26 mmol/L (ref 22–32)
Calcium: 9.9 mg/dL (ref 8.9–10.3)
Chloride: 103 mmol/L (ref 98–111)
Creatinine, Ser: 1.43 mg/dL — ABNORMAL HIGH (ref 0.61–1.24)
GFR, Estimated: 58 mL/min — ABNORMAL LOW (ref >60)
Glucose, Bld: 97 mg/dL (ref 70–99)
Potassium: 4.4 mmol/L (ref 3.5–5.1)
Sodium: 139 mmol/L (ref 135–145)

## 2022-09-10 NOTE — Telephone Encounter (Signed)
Anita, please advise. Thanks 

## 2022-09-10 NOTE — Telephone Encounter (Signed)
Dr. Genia Harold received note from [facility] stated insurance denied in lab sleep study and placed an order for Home Sleep Test due to the end of year being near.  I faxed the home sleep test order to the sleep lab in hopes of getting the appt setup before the end of the year. I'm not sure why the sleep lab didn't make sure the patient was aware the in lab sleep study was denied

## 2022-09-10 NOTE — Patient Instructions (Addendum)
Continue weighing daily and call for an overnight weight gain of 3 pounds or more or a weekly weight gain of more than 5 pounds.   If you have voicemail, please make sure your mailbox is cleaned out so that we may leave a message and please make sure to listen to any voicemails.    If you receive a satisfaction survey regarding the [facility], please take the time to fill it out. This way we can continue to provide excellent care and make any changes that need to be made.    Bring your medications to every visit.    I have put the exercise referral in and they will reach out to you.    It was a pleasure meeting you today!

## 2022-09-11 NOTE — Telephone Encounter (Signed)
Cory Rivas, please advise. Patient is requesting that you contact him.

## 2022-09-12 NOTE — Telephone Encounter (Signed)
I called the number below yesterday and whoever answered the phone we had a bad connection. I told them to call me back at the number that I was calling from. As of right now no one has called me back

## 2022-09-13 NOTE — Telephone Encounter (Signed)
I have now spoke with Abigail Butts the sister and explained that Dr. Genia Harold received note from [facility] stating insurance denied the in lab study. Dr. Genia Harold placed an order on 09/05/22 for Home Sleep Test. I faxed the order to [facility] because I  thought they would be able to get him in before the end of the year since we are 5 weeks out

## 2022-09-20 ENCOUNTER — Other Ambulatory Visit: Payer: Self-pay

## 2022-09-20 ENCOUNTER — Encounter: Payer: Self-pay | Admitting: Nurse Practitioner

## 2022-09-20 ENCOUNTER — Ambulatory Visit: Payer: Medicaid Other | Admitting: Nurse Practitioner

## 2022-09-20 VITALS — BP 134/82 | HR 76 | Temp 98.7°F | Resp 16 | Ht 66.0 in | Wt 233.1 lb

## 2022-09-20 DIAGNOSIS — H6121 Impacted cerumen, right ear: Secondary | ICD-10-CM | POA: Diagnosis not present

## 2022-09-20 DIAGNOSIS — Z720 Tobacco use: Secondary | ICD-10-CM | POA: Diagnosis not present

## 2022-09-20 DIAGNOSIS — H9201 Otalgia, right ear: Secondary | ICD-10-CM | POA: Diagnosis not present

## 2022-09-20 MED ORDER — NICOTINE POLACRILEX 2 MG MT LOZG: 2.0000 mg | LOZENGE | OROMUCOSAL | 3 refills | Status: DC | PRN

## 2022-09-20 MED ORDER — DEBROX 6.5 % OT SOLN: 5.0000 [drp] | Freq: Two times a day (BID) | OTIC | 0 refills | Status: DC

## 2022-09-20 NOTE — Progress Notes (Signed)
BP 134/82   Pulse 76   Temp 98.7 F (37.1 C) (Oral)   Resp 16   Ht '5\' 6"'$  (1.676 m)   Wt 233 lb 1.6 oz (105.7 kg)   SpO2 98%   BMI 37.62 kg/m    Subjective:    Patient ID: Cory Rivas, male    DOB: [birth year], 56 y.o.   MRN: [id number]  HPI: Cory Rivas is a 56 y.o. male  Chief Complaint  Patient presents with   Ear Pain    Right ear bleeding, itching   Right ear pain:  he says his right ear is throbbing and itchy started on Sunday. He says that he put some cotton in his ear and noticed blood.   He denies any fever or URI symptoms. He reports that he has had trouble with that ear in the past and was referred to ENT but was never seen. He also reports that they have tried to irrigate his ear before but he did not tolerate it. He was referred to ent on 2/20/223 but was unable to schedule an appointment due to phone issues.  Will place new referral and give patient number . Will also send in prescription for debrox drops to try and help loosen cerumen.  Relevant past medical, surgical, family and social history reviewed and updated as indicated. Interim medical history since our last visit reviewed. Allergies and medications reviewed and updated.  Review of Systems  Constitutional: Negative for fever or weight change.  HEENT: right ear itching Respiratory: Negative for cough and shortness of breath.   Cardiovascular: Negative for chest pain or palpitations.  Gastrointestinal: Negative for abdominal pain, no bowel changes.  Musculoskeletal: Negative for gait problem or joint swelling.  Skin: Negative for rash.  Neurological: Negative for dizziness or headache.  No other specific complaints in a complete review of systems (except as listed in HPI above).      Objective:    BP 134/82   Pulse 76   Temp 98.7 F (37.1 C) (Oral)   Resp 16   Ht '5\' 6"'$  (1.676 m)   Wt 233 lb 1.6 oz (105.7 kg)   SpO2 98%   BMI 37.62 kg/m   Wt Readings from Last 3 Encounters:  09/20/22  233 lb 1.6 oz (105.7 kg)  09/10/22 226 lb (102.5 kg)  08/30/22 230 lb 12.8 oz (104.7 kg)    Physical Exam  Constitutional: Patient appears well-developed and well-nourished. Obese  No distress.  HEENT: head atraumatic, normocephalic, pupils equal and reactive to light, ears right ear unable to visualize TM, scratch noted in canal with dried blood, neck supple, throat within normal limits Cardiovascular: Normal rate, regular rhythm and normal heart sounds.  No murmur heard. No BLE edema. Pulmonary/Chest: Effort normal and breath sounds normal. No respiratory distress. Abdominal: Soft.  There is no tenderness. Psychiatric: Patient has a normal mood and affect. behavior is normal. Judgment and thought content normal.  Results for orders placed or performed in visit on 92/11/94  Basic metabolic panel  Result Value Ref Range   Sodium 139 135 - 145 mmol/L   Potassium 4.4 3.5 - 5.1 mmol/L   Chloride 103 98 - 111 mmol/L   CO2 26 22 - 32 mmol/L   Glucose, Bld 97 70 - 99 mg/dL   BUN 23 (H) 6 - 20 mg/dL   Creatinine, Ser 1.43 (H) 0.61 - 1.24 mg/dL   Calcium 9.9 8.9 - 10.3 mg/dL   GFR, Estimated 58 (L) >  60 mL/min   Anion gap 10 5 - 15      Assessment & Plan:   Problem List Items Addressed This Visit       Other   Tobacco use   Relevant Medications   nicotine polacrilex (NICOTINE MINI) 2 MG lozenge   Other Visit Diagnoses     Impacted cerumen of right ear    -  Primary   try debrox ear drops to loosen up cerumen, follow up with ent   Relevant Medications   carbamide peroxide (DEBROX) 6.5 % OTIC solution   Other Relevant Orders   Ambulatory referral to ENT   Right ear pain       try debrox ear drops to loosen up cerumen, follow up with ent   Relevant Medications   carbamide peroxide (DEBROX) 6.5 % OTIC solution   Other Relevant Orders   Ambulatory referral to ENT        Follow up plan: Return if symptoms worsen or fail to improve.

## 2022-09-21 ENCOUNTER — Ambulatory Visit: Payer: Medicaid Other | Attending: Cardiology | Admitting: Cardiology

## 2022-09-21 ENCOUNTER — Encounter: Payer: Self-pay | Admitting: Cardiology

## 2022-09-21 VITALS — BP 146/88 | HR 52 | Ht 67.0 in | Wt 233.2 lb

## 2022-09-21 DIAGNOSIS — R0602 Shortness of breath: Secondary | ICD-10-CM | POA: Diagnosis not present

## 2022-09-21 DIAGNOSIS — I10 Essential (primary) hypertension: Secondary | ICD-10-CM | POA: Diagnosis not present

## 2022-09-21 DIAGNOSIS — I5030 Unspecified diastolic (congestive) heart failure: Secondary | ICD-10-CM

## 2022-09-21 DIAGNOSIS — E782 Mixed hyperlipidemia: Secondary | ICD-10-CM | POA: Diagnosis not present

## 2022-09-21 NOTE — Progress Notes (Signed)
Cardiology Office Note:    Date:  09/21/2022   ID:  Cory Rivas, DOB [birth year], MRN [id number]  PCP:  Delsa Grana, PA-C  [facility] Cardiologist:  Kate Sable, MD  [facility] Electrophysiologist:  None   Referring MD: Delsa Grana, PA-C   Chief Complaint  Patient presents with   Follow-up    Testing follow up, SOB     History of Present Illness:    Cory Rivas is a 56 y.o. male with a hx of HFpEF, hypertension, diabetes, hyperlipidemia,smoker x35+ years who presents for follow-up.    Recently admitted to the hospital with shortness of breath and volume overload.  Diuresed with Lasix, discharged on Lasix 20 mg daily.  Echo obtained 08/2022 showed normal systolic function EF 60 to 34%, grade 2 diastolic dysfunction.  Has been feeling much better since hospital discharge.  Also quit smoking.  Has appointment with pulmonary medicine due to long history of smoking.  Ran out of his blood pressure medications couple of days ago x 2 days.  Has been compliant with meds as prescribed over the past 4 days.  Prior notes Echo 10/2020 EF 60 to 65%, aortic sclerosis Lexiscan Myoview 09/2020 no significant ischemia, low risk study   Past Medical History:  Diagnosis Date   Bulging of cervical intervertebral disc    CHF (congestive heart failure) ([place])    Diabetes mellitus without complication (HCC)    Gout    High cholesterol    Hypertension    Stress due to family tension 04/15/2019    Past Surgical History:  Procedure Laterality Date   CHOLECYSTECTOMY     COLONOSCOPY WITH PROPOFOL N/A 03/10/2020   Procedure: COLONOSCOPY WITH PROPOFOL;  Surgeon: Lucilla Lame, MD;  Location: [facility] ENDOSCOPY;  Service: Endoscopy;  Laterality: N/A;    Current Medications: Current Meds  Medication Sig   allopurinol (ZYLOPRIM) 100 MG tablet TAKE 1 TABLET BY MOUTH ONCE A DAY. TAKE WITH 300 MG TABLETS   allopurinol (ZYLOPRIM) 300 MG tablet Take one 300 mg tab po daily WITH 100 mg  tab for total of 400 mg once daily for gout   amLODipine (NORVASC) 10 MG tablet Take 1 tablet (10 mg total) by mouth daily.   budesonide-formoterol (SYMBICORT) 160-4.5 MCG/ACT inhaler Inhale 2 puffs into the lungs 2 (two) times daily.   carbamide peroxide (DEBROX) 6.5 % OTIC solution Place 5 drops into the right ear 2 (two) times daily.   DULoxetine (CYMBALTA) 30 MG capsule Take 1 capsule (30 mg total) by mouth 2 (two) times daily.   ezetimibe (ZETIA) 10 MG tablet Take 1 tablet (10 mg total) by mouth daily.   fluticasone (FLONASE) 50 MCG/ACT nasal spray Place 1 spray into both nostrils daily.   furosemide (LASIX) 20 MG tablet Take 1 tablet (20 mg total) by mouth in the morning. Take with potassium supplement   hydrALAZINE (APRESOLINE) 100 MG tablet Take 1 tablet (100 mg total) by mouth 3 (three) times daily. Monitor BP   indomethacin (INDOCIN) 25 MG capsule TAKE 2 TABLETS (50 MG) BY 3 TIMES DAILY FOR 2 DAYS AT ONSET OF GOUT FLARE UP, THEN TAKE 1('25MG'$ )TABLET BY MOUTH FOR 3 DAYS   insulin glargine (LANTUS SOLOSTAR) 100 UNIT/ML Solostar Pen Inject 20 Units into the skin at bedtime.   insulin lispro (HUMALOG) 100 UNIT/ML KwikPen Inject 1-45 Units into the skin 3 (three) times daily. Sliding scale up to 45 units per injection   ipratropium-albuterol (DUONEB) 0.5-2.5 (3) MG/3ML SOLN Take 3 mLs  by nebulization every 6 (six) hours as needed (SOB wheeze cough).   levocetirizine (XYZAL) 5 MG tablet Take 1 tablet (5 mg total) by mouth daily.   lisinopril (ZESTRIL) 40 MG tablet Take 20 mg by mouth daily.   metFORMIN (GLUCOPHAGE) 1000 MG tablet TAKE 1 TABLET BY MOUTH TWICE A DAY WITH A MEAL   nicotine polacrilex (NICOTINE MINI) 2 MG lozenge Take 1 lozenge (2 mg total) by mouth every 2 (two) hours as needed for smoking cessation.   potassium chloride SA (KLOR-CON M) 20 MEQ tablet Take 1 tablet (20 mEq total) by mouth daily. Take with furosemide   predniSONE (DELTASONE) 20 MG tablet Take 2 tablets (40 mg total)  by mouth daily with breakfast.   pregabalin (LYRICA) 25 MG capsule TAKE 1 CAPSULE BY MOUTH IN THE MORNING AND 1 TO 3 CAPSULES EVERY NIGHT FOR CERVICAL RADICULOPATHY AND NEUROPATHY   rosuvastatin (CRESTOR) 40 MG tablet Take 1 tablet (40 mg total) by mouth daily.   TRULICITY 3 PP/2.9JJ SOPN Inject into the skin.   VENTOLIN HFA 108 (90 Base) MCG/ACT inhaler INHALE 2 PUFFS INTO THE LUNGS EVERY 6 HOURS AS NEEDED FOR WHEEZING OR SHORTNESS OF BREATH     Allergies:   Oxycodone and Onion   Social History   Socioeconomic History   Marital status: Divorced    Spouse name: Not on file   Number of children: 0   Years of education: 9   Highest education level: 9th grade  Occupational History   Occupation: disability  Tobacco Use   Smoking status: Former    Packs/day: 1.00    Years: 37.00    Total pack years: 37.00    Types: Cigarettes    Start date: 10/19/1989    Quit date: 08/20/2022    Years since quitting: 0.0   Smokeless tobacco: Never   Tobacco comments:    30+ years hx smoking 2 ppd, stopped smoking briefly in April 2022 for a surgery and then restarted smoking about 1/2 ppd last 4-5 months   Vaping Use   Vaping Use: Never used  Substance and Sexual Activity   Alcohol use: No   Drug use: No   Sexual activity: Not Currently    Partners: Female  Other Topics Concern   Not on file  Social History Narrative   Not on file   Social Determinants of Health   Financial Resource Strain: Medium Risk (07/30/2019)   Overall Financial Resource Strain (CARDIA)    Difficulty of Paying Living Expenses: Somewhat hard  Food Insecurity: No Food Insecurity (08/23/2022)   Hunger Vital Sign    Worried About Running Out of Food in the Last Year: Never true    [place] in the Last Year: Never true  Transportation Needs: No Transportation Needs (08/23/2022)   PRAPARE - Hydrologist (Medical): No    Lack of Transportation (Non-Medical): No  Physical Activity:  Inactive (03/18/2019)   Exercise Vital Sign    Days of Exercise per Week: 0 days    Minutes of Exercise per Session: 0 min  Stress: Stress Concern Present (03/18/2019)   [place]    Feeling of Stress : To some extent  Social Connections: Unknown (07/30/2019)   Social Connection and Isolation Panel [NHANES]    Frequency of Communication with Friends and Family: Not on file    Frequency of Social Gatherings with Friends and Family: Never    Attends  Religious Services: Not on file    Active Member of Clubs or Organizations: Not on file    Attends Club or Organization Meetings: Not on file    Marital Status: Not on file     Family History: The patient's family history includes AAA (abdominal aortic aneurysm) in his maternal grandmother; Diabetes in his maternal grandfather and mother; Heart attack in his maternal grandmother and mother; Heart disease in his mother; Hyperlipidemia in his mother; Hypertension in his father and mother; Lung cancer in his father; Stroke in his father.  ROS:   Please see the history of present illness.     All other systems reviewed and are negative.  EKGs/Labs/Other Studies Reviewed:    The following studies were reviewed today:   EKG:  EKG not ordered today.   Recent Labs: 08/23/2022: B Natriuretic Peptide 164.0 08/30/2022: ALT 27; Hemoglobin 15.7; Platelets 234 09/10/2022: BUN 23; Creatinine, Ser 1.43; Potassium 4.4; Sodium 139  Recent Lipid Panel    Component Value Date/Time   CHOL 224 (H) 06/08/2022 1534   TRIG 266 (H) 06/08/2022 1534   HDL 36 (L) 06/08/2022 1534   CHOLHDL 6.2 06/08/2022 1534   VLDL 53 (H) 06/08/2022 1534   LDLCALC 135 (H) 06/08/2022 1534   LDLCALC 133 (H) 03/06/2022 1534   LDLDIRECT 135 (H) 08/18/2021 1438     Risk Assessment/Calculations:      Physical Exam:    VS:  BP (!) 146/88 (BP Location: Left Arm, Patient Position: Sitting, Cuff Size: Normal)    Pulse (!) 52   Ht '5\' 7"'$  (1.702 m)   Wt 233 lb 3.2 oz (105.8 kg)   SpO2 97%   BMI 36.52 kg/m     Wt Readings from Last 3 Encounters:  09/21/22 233 lb 3.2 oz (105.8 kg)  09/20/22 233 lb 1.6 oz (105.7 kg)  09/10/22 226 lb (102.5 kg)     GEN:  Well nourished, well developed in no acute distress HEENT: Normal NECK: No JVD; No carotid bruits CARDIAC: RRR, 2/6 systolic murmur loudest at left lower sternal border.  RESPIRATORY:  Clear to auscultation without rales, wheezing or rhonchi  ABDOMEN: Soft, non-tender, non-distended MUSCULOSKELETAL:  No edema; No deformity  SKIN: Warm and dry NEUROLOGIC:  Alert and oriented x 3 PSYCHIATRIC:  Normal affect   ASSESSMENT:    1. Heart failure with preserved ejection fraction, unspecified HF chronicity ([place])   2. Primary hypertension   3. Mixed hyperlipidemia   4. Shortness of breath    PLAN:    In order of problems listed above:  HFpEF, shortness of breath improved with smoking cessation.  Edema better with Lasix 20 mg daily.  Continue Lasix.  Refer to cardiopulmonary rehab.  Low-calorie diet, weight loss, exercise at home also advised. Hypertension, BP elevated usually better controlled.,  Continue hydralazine 100 mg 3 times daily, lisinopril 40, amlodipine 10 mg daily.  Medication compliance advised.  Consider adding Coreg if BP stays elevated. Hyperlipidemia, continue Crestor 40 mg daily.  Repeat lipid panel at follow-up visit. Shortness of breath, former smoker, advised to keep follow-up appointment with pulmonary medicine  Follow-up in 6 to 8 weeks.  Medication Adjustments/Labs and Tests Ordered: Current medicines are reviewed at length with the patient today.  Concerns regarding medicines are outlined above.  Orders Placed This Encounter  Procedures   AMB referral to cardiac rehabilitation   No orders of the defined types were placed in this encounter.   Patient Instructions  Medication Instructions:   None Ordered  *  If you  need a refill on your cardiac medications before your next appointment, please call your pharmacy*   Lab Work:  None Ordered  If you have labs (blood work) drawn today and your tests are completely normal, you will receive your results only by: [place] (if you have MyChart) OR A paper copy in the mail If you have any lab test that is abnormal or we need to change your treatment, we will call you to review the results.   Testing/Procedures:  None Ordered  Follow-Up: At [facility], you and your health needs are our priority.  As part of our continuing mission to provide you with exceptional heart care, we have created designated Provider Care Teams.  These Care Teams include your primary Cardiologist (physician) and Advanced Practice Providers (APPs -  Physician Assistants and Nurse Practitioners) who all work together to provide you with the care you need, when you need it.  We recommend signing up for the patient portal called "MyChart".  Sign up information is provided on this After Visit Summary.  MyChart is used to connect with patients for Virtual Visits (Telemedicine).  Patients are able to view lab/test results, encounter notes, upcoming appointments, etc.  Non-urgent messages can be sent to your provider as well.   To learn more about what you can do with MyChart, go to [email or web address].    Your next appointment:   2 month(s)  The format for your next appointment:   In Person  Provider:   You may see Kate Sable, MD or one of the following Advanced Practice Providers on your designated Care Team:   Murray Hodgkins, NP Christell Faith, PA-C Cadence Kathlen Mody, PA-C Gerrie Nordmann, NP      Signed, Kate Sable, MD  09/21/2022 12:29 PM    [place]

## 2022-09-21 NOTE — Patient Instructions (Signed)
Medication Instructions:   None Ordered  *If you need a refill on your cardiac medications before your next appointment, please call your pharmacy*   Lab Work:  None Ordered  If you have labs (blood work) drawn today and your tests are completely normal, you will receive your results only by: [place] (if you have MyChart) OR A paper copy in the mail If you have any lab test that is abnormal or we need to change your treatment, we will call you to review the results.   Testing/Procedures:  None Ordered  Follow-Up: At [facility], you and your health needs are our priority.  As part of our continuing mission to provide you with exceptional heart care, we have created designated Provider Care Teams.  These Care Teams include your primary Cardiologist (physician) and Advanced Practice Providers (APPs -  Physician Assistants and Nurse Practitioners) who all work together to provide you with the care you need, when you need it.  We recommend signing up for the patient portal called "MyChart".  Sign up information is provided on this After Visit Summary.  MyChart is used to connect with patients for Virtual Visits (Telemedicine).  Patients are able to view lab/test results, encounter notes, upcoming appointments, etc.  Non-urgent messages can be sent to your provider as well.   To learn more about what you can do with MyChart, go to [email or web address].    Your next appointment:   2 month(s)  The format for your next appointment:   In Person  Provider:   You may see Kate Sable, MD or one of the following Advanced Practice Providers on your designated Care Team:   Murray Hodgkins, NP Christell Faith, PA-C Cadence Kathlen Mody, PA-C Gerrie Nordmann, NP

## 2022-09-24 ENCOUNTER — Encounter: Payer: Self-pay | Admitting: Student in an Organized Health Care Education/Training Program

## 2022-09-24 ENCOUNTER — Ambulatory Visit: Payer: Medicaid Other | Admitting: Student in an Organized Health Care Education/Training Program

## 2022-09-24 VITALS — BP 144/80 | HR 71 | Temp 98.1°F | Ht 67.0 in | Wt 232.4 lb

## 2022-09-24 DIAGNOSIS — Z720 Tobacco use: Secondary | ICD-10-CM | POA: Diagnosis not present

## 2022-09-24 DIAGNOSIS — Z87891 Personal history of nicotine dependence: Secondary | ICD-10-CM | POA: Diagnosis not present

## 2022-09-24 DIAGNOSIS — R0602 Shortness of breath: Secondary | ICD-10-CM

## 2022-09-24 MED ORDER — NICOTINE POLACRILEX 2 MG MT LOZG: 2.0000 mg | LOZENGE | OROMUCOSAL | 6 refills | Status: DC | PRN

## 2022-09-24 MED ORDER — SPIRIVA RESPIMAT 2.5 MCG/ACT IN AERS: 2.0000 | INHALATION_SPRAY | Freq: Every day | RESPIRATORY_TRACT | 11 refills | Status: DC

## 2022-09-24 NOTE — Progress Notes (Signed)
Synopsis: Follow up on shortness of breath  Assessment & Plan:   #Shortness of breath #Asthma #Tracheobronchomalacia #OSA   Mr. Shimabukuro has a history of childhood asthma and is presenting for episodic shortness of breath, wheezing, and cough. His symptoms seem to have somewhat improved with the initiation of Symbicot. Today, I will add Spiriva to his regimen for ICS/LABA/LAMA given his smoking history. He has quit smoking and I congratulated him on that today. I will be sending refills for his nicotine lozenges given it has been helpful.  My differential for Mr. Rampey's symptoms is broad. He has a history of childhood asthma and could have been having asthma exacerbations. I will also obtain a pulmonary function test with pre and post albuterol spirometry, lung volumes and DLCO. Unfortunately, he hasn't had the test yet.  Other obstructive diseases on the differential include COPD as well as a COPD/Asthma overlap syndrome. Furthermore, review of his chest CT is notable for significant narrowing of the left mainstem bronchus and is suggestive of tracheobronchomalacia. This could be contributing to his symptoms as it could mimic asthma. Given the high possibility of asthma, I will focus my evaluation on workup and management of his obstructive lung disease prior to initiating workup for TBM. I will also obtain a sleep study (split night study denied by insurance, will start with home sleep study) given his symptoms of snoring. The CPAP would help with the TBM. I will consider a referral to a center with IP for possible rigid bronchoscopy with silicone Y-stent placement for assessment should he not have any improvement of symptoms in the future.   He will continue with Symbicort, Flonase, Levocetirizine, and initiate Spiriva.    #Former tobacco user  - nicotine polacrilex (NICOTINE MINI) 2 MG lozenge; Take 1 lozenge (2 mg total) by mouth every 2 (two) hours as needed for smoking cessation.   Dispense: 72 lozenge; Refill: 6  Return in about 3 months (around 12/24/2022).  I spent 30 minutes caring for this patient today, including preparing to see the patient, obtaining a medical history , reviewing a separately obtained history, performing a medically appropriate examination and/or evaluation, counseling and educating the patient/family/caregiver, documenting clinical information in the electronic health record, and independently interpreting results (not separately reported/billed) and communicating results to the patient/family/caregiver  Armando Reichert, MD [facility] 09/24/2022 2:55 PM    End of visit medications:  Meds ordered this encounter  Medications   nicotine polacrilex (NICOTINE MINI) 2 MG lozenge    Sig: Take 1 lozenge (2 mg total) by mouth every 2 (two) hours as needed for smoking cessation.    Dispense:  72 lozenge    Refill:  6   Tiotropium Bromide Monohydrate (SPIRIVA RESPIMAT) 2.5 MCG/ACT AERS    Sig: Inhale 2 puffs into the lungs daily.    Dispense:  30 each    Refill:  11     Current Outpatient Medications:    allopurinol (ZYLOPRIM) 100 MG tablet, TAKE 1 TABLET BY MOUTH ONCE A DAY. TAKE WITH 300 MG TABLETS, Disp: 90 tablet, Rfl: 1   allopurinol (ZYLOPRIM) 300 MG tablet, Take one 300 mg tab po daily WITH 100 mg tab for total of 400 mg once daily for gout, Disp: 90 tablet, Rfl: 1   amLODipine (NORVASC) 10 MG tablet, Take 1 tablet (10 mg total) by mouth daily., Disp: 90 tablet, Rfl: 3   budesonide-formoterol (SYMBICORT) 160-4.5 MCG/ACT inhaler, Inhale 2 puffs into the lungs 2 (two) times daily.,  Disp: 1 each, Rfl: 12   carbamide peroxide (DEBROX) 6.5 % OTIC solution, Place 5 drops into the right ear 2 (two) times daily., Disp: 15 mL, Rfl: 0   DULoxetine (CYMBALTA) 30 MG capsule, Take 1 capsule (30 mg total) by mouth 2 (two) times daily., Disp: 180 capsule, Rfl: 1   ezetimibe (ZETIA) 10 MG tablet, Take 1 tablet (10 mg total) by mouth  daily., Disp: 90 tablet, Rfl: 3   fluticasone (FLONASE) 50 MCG/ACT nasal spray, Place 1 spray into both nostrils daily., Disp: 18.2 mL, Rfl: 6   furosemide (LASIX) 20 MG tablet, Take 1 tablet (20 mg total) by mouth in the morning. Take with potassium supplement, Disp: 30 tablet, Rfl: 1   hydrALAZINE (APRESOLINE) 100 MG tablet, Take 1 tablet (100 mg total) by mouth 3 (three) times daily. Monitor BP, Disp: 90 tablet, Rfl: 1   indomethacin (INDOCIN) 25 MG capsule, TAKE 2 TABLETS (50 MG) BY 3 TIMES DAILY FOR 2 DAYS AT ONSET OF GOUT FLARE UP, THEN TAKE 1('25MG'$ )TABLET BY MOUTH FOR 3 DAYS, Disp: 20 capsule, Rfl: 3   insulin glargine (LANTUS SOLOSTAR) 100 UNIT/ML Solostar Pen, Inject 20 Units into the skin at bedtime., Disp: , Rfl:    insulin lispro (HUMALOG) 100 UNIT/ML KwikPen, Inject 1-45 Units into the skin 3 (three) times daily. Sliding scale up to 45 units per injection, Disp: , Rfl:    ipratropium-albuterol (DUONEB) 0.5-2.5 (3) MG/3ML SOLN, Take 3 mLs by nebulization every 6 (six) hours as needed (SOB wheeze cough)., Disp: 180 mL, Rfl: 1   levocetirizine (XYZAL) 5 MG tablet, Take 1 tablet (5 mg total) by mouth daily., Disp: 30 tablet, Rfl: 11   lisinopril (ZESTRIL) 40 MG tablet, Take 20 mg by mouth daily., Disp: , Rfl:    metFORMIN (GLUCOPHAGE) 1000 MG tablet, TAKE 1 TABLET BY MOUTH TWICE A DAY WITH A MEAL, Disp: 180 tablet, Rfl: 3   potassium chloride SA (KLOR-CON M) 20 MEQ tablet, Take 1 tablet (20 mEq total) by mouth daily. Take with furosemide, Disp: 30 tablet, Rfl: 3   predniSONE (DELTASONE) 20 MG tablet, Take 2 tablets (40 mg total) by mouth daily with breakfast., Disp: 4 tablet, Rfl: 0   pregabalin (LYRICA) 25 MG capsule, TAKE 1 CAPSULE BY MOUTH IN THE MORNING AND 1 TO 3 CAPSULES EVERY NIGHT FOR CERVICAL RADICULOPATHY AND NEUROPATHY, Disp: 270 capsule, Rfl: 1   rosuvastatin (CRESTOR) 40 MG tablet, Take 1 tablet (40 mg total) by mouth daily., Disp: 90 tablet, Rfl: 3   Tiotropium Bromide  Monohydrate (SPIRIVA RESPIMAT) 2.5 MCG/ACT AERS, Inhale 2 puffs into the lungs daily., Disp: 30 each, Rfl: 11   TRULICITY 3 NI/7.7OE SOPN, Inject into the skin., Disp: , Rfl:    VENTOLIN HFA 108 (90 Base) MCG/ACT inhaler, INHALE 2 PUFFS INTO THE LUNGS EVERY 6 HOURS AS NEEDED FOR WHEEZING OR SHORTNESS OF BREATH, Disp: 18 g, Rfl: 2   nicotine polacrilex (NICOTINE MINI) 2 MG lozenge, Take 1 lozenge (2 mg total) by mouth every 2 (two) hours as needed for smoking cessation., Disp: 72 lozenge, Rfl: 6   Olopatadine HCl 0.2 % SOLN, Apply 1 drop to eye 2 (two) times daily as needed (eye allergies). (Patient not taking: Reported on 09/10/2022), Disp: 2.5 mL, Rfl: 3   Subjective:   PATIENT ID: Cory Rivas GENDER: male DOB: [birth year], MRN: [id number]  Chief Complaint  Patient presents with   Follow-up    Better.     HPI  Mr. Madan is a  pleasant 56 year old male patient presenting to clinic for follow up on shortness of breath.   Patient reports symptoms of increased shortness of breath over the past year that is associated with wheezing, cough, and sputum production.  He reports that for the past year, he started noticing the symptoms that have progressed prompting him to present to the emergency department multiple times for an evaluation.  On presentation, he was worked up and ruled out for pulmonary embolism and was told he has bronchitis.  He was given prednisone in June and was recently started on Symbicort by his primary care provider.  Also notable is a prescription for Paxlovid in September 2023. He was admitted for 2 days in early November where he was managed for decompensated heart failure and possible asthma exacerbation.   Since our last visit, he has quit smoking. He's felt significantly better now that he quit smoking, and is using the lozenges which help. He is using the Symbicort twice daily which is also helping with his symptoms. He still has some symptoms of a cough and wheeze that  resolve with an inhaler/nebulizer. He tells me he snores at night and he has been having nasal congestion. He has not had his sleep study yet.   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 He had to undergo cervical fusion at [facility] after which he has been on disability.  Ancillary information including prior medications, full medical/surgical/family/social histories, and PFTs (when available) are listed below and have been reviewed.   Review of Systems  Constitutional:  Negative for weight loss.  Respiratory:  Positive for shortness of breath. Negative for cough, hemoptysis, sputum production and wheezing.   Cardiovascular:  Negative for chest pain.     Objective:   Vitals:   09/24/22 1441  BP: (!) 144/80  Pulse: 71  Temp: 98.1 F (36.7 C)  SpO2: 96%  Weight: 232 lb 6.4 oz (105.4 kg)  Height: '5\' 7"'$  (1.702 m)   96% on RA  BMI Readings from Last 3 Encounters:  09/24/22 36.40 kg/m  09/21/22 36.52 kg/m  09/20/22 37.62 kg/m   Wt Readings from Last 3 Encounters:  09/24/22 232 lb 6.4 oz (105.4 kg)  09/21/22 233 lb 3.2 oz (105.8 kg)  09/20/22 233 lb 1.6 oz (105.7 kg)    Physical Exam Constitutional:      Appearance: He is obese. He is not ill-appearing.  HENT:     Nose: No congestion or rhinorrhea.     Mouth/Throat:     Mouth: Mucous membranes are moist.  Eyes:     Pupils: Pupils are equal, round, and reactive to light.  Cardiovascular:     Rate and Rhythm: Normal rate and regular rhythm.     Heart sounds: Normal heart sounds.  Pulmonary:     Effort: Pulmonary effort is normal.     Breath sounds: Normal breath sounds. No wheezing, rhonchi or rales.  Abdominal:     General: There is distension.     Palpations: Abdomen is soft.  Musculoskeletal:        General: Normal range of motion.  Neurological:     General: No focal deficit present.     Mental Status: He is alert and oriented to person, place, and time. Mental status is at  baseline.      Ancillary Information    Past Medical History:  Diagnosis Date   Bulging of cervical intervertebral disc    CHF (congestive heart failure) ([place])  Diabetes mellitus without complication ([place])    Gout    High cholesterol    Hypertension    Stress due to family tension 04/15/2019     Family History  Problem Relation Age of Onset   Heart disease Mother    Diabetes Mother    Hyperlipidemia Mother    Hypertension Mother    Heart attack Mother    Lung cancer Father    Hypertension Father    Stroke Father    AAA (abdominal aortic aneurysm) Maternal Grandmother    Heart attack Maternal Grandmother    Diabetes Maternal Grandfather      Past Surgical History:  Procedure Laterality Date   CHOLECYSTECTOMY     COLONOSCOPY WITH PROPOFOL N/A 03/10/2020   Procedure: COLONOSCOPY WITH PROPOFOL;  Surgeon: Lucilla Lame, MD;  Location: [facility] ENDOSCOPY;  Service: Endoscopy;  Laterality: N/A;    Social History   Socioeconomic History   Marital status: Divorced    Spouse name: Not on file   Number of children: 0   Years of education: 9   Highest education level: 9th grade  Occupational History   Occupation: disability  Tobacco Use   Smoking status: Former    Packs/day: 1.00    Years: 37.00    Total pack years: 37.00    Types: Cigarettes    Start date: 10/19/1989    Quit date: 08/20/2022    Years since quitting: 0.0   Smokeless tobacco: Never   Tobacco comments:    30+ years hx smoking 2 ppd, stopped smoking briefly in April 2022 for a surgery and then restarted smoking about 1/2 ppd last 4-5 months     Quit smoking 08/20/2022  Vaping Use   Vaping Use: Never used  Substance and Sexual Activity   Alcohol use: No   Drug use: No   Sexual activity: Not Currently    Partners: Female  Other Topics Concern   Not on file  Social History Narrative   Not on file   Social Determinants of Health   Financial Resource Strain: Medium Risk (07/30/2019)   Overall  Financial Resource Strain (CARDIA)    Difficulty of Paying Living Expenses: Somewhat hard  Food Insecurity: No Food Insecurity (08/23/2022)   Hunger Vital Sign    Worried About Running Out of Food in the Last Year: Never true    Ran Out of Food in the Last Year: Never true  Transportation Needs: No Transportation Needs (08/23/2022)   PRAPARE - Hydrologist (Medical): No    Lack of Transportation (Non-Medical): No  Physical Activity: Inactive (03/18/2019)   Exercise Vital Sign    Days of Exercise per Week: 0 days    Minutes of Exercise per Session: 0 min  Stress: Stress Concern Present (03/18/2019)   [place]    Feeling of Stress : To some extent  Social Connections: Unknown (07/30/2019)   Social Connection and Isolation Panel [NHANES]    Frequency of Communication with Friends and Family: Not on file    Frequency of Social Gatherings with Friends and Family: Never    Attends Religious Services: Not on file    Active Member of Clubs or Organizations: Not on file    Attends Archivist Meetings: Not on file    Marital Status: Not on file  Intimate Partner Violence: Not At Risk (08/23/2022)   Humiliation, Afraid, Rape, and Kick questionnaire    Fear of Current or  Ex-Partner: No    Emotionally Abused: No    Physically Abused: No    Sexually Abused: No     Allergies  Allergen Reactions   Oxycodone Nausea And Vomiting   Onion Nausea And Vomiting     CBC    Component Value Date/Time   WBC 14.0 (H) 08/30/2022 0850   RBC 5.56 08/30/2022 0850   HGB 15.7 08/30/2022 0850   HGB 14.1 08/01/2022 1057   HCT 46.5 08/30/2022 0850   HCT 42.1 08/01/2022 1057   PLT 234 08/30/2022 0850   PLT 221 08/01/2022 1057   MCV 83.6 08/30/2022 0850   MCV 85 08/01/2022 1057   MCH 28.2 08/30/2022 0850   MCHC 33.8 08/30/2022 0850   RDW 13.5 08/30/2022 0850   RDW 13.6 08/01/2022 1057   LYMPHSABS 2,478  08/30/2022 0850   LYMPHSABS 2.1 08/01/2022 1057   MONOABS 0.6 08/23/2022 0534   EOSABS 154 08/30/2022 0850   EOSABS 0.1 08/01/2022 1057   BASOSABS 56 08/30/2022 0850   BASOSABS 0.0 08/01/2022 1057    Pulmonary Functions Testing Results:     No data to display          Outpatient Medications Prior to Visit  Medication Sig Dispense Refill   allopurinol (ZYLOPRIM) 100 MG tablet TAKE 1 TABLET BY MOUTH ONCE A DAY. TAKE WITH 300 MG TABLETS 90 tablet 1   allopurinol (ZYLOPRIM) 300 MG tablet Take one 300 mg tab po daily WITH 100 mg tab for total of 400 mg once daily for gout 90 tablet 1   amLODipine (NORVASC) 10 MG tablet Take 1 tablet (10 mg total) by mouth daily. 90 tablet 3   budesonide-formoterol (SYMBICORT) 160-4.5 MCG/ACT inhaler Inhale 2 puffs into the lungs 2 (two) times daily. 1 each 12   carbamide peroxide (DEBROX) 6.5 % OTIC solution Place 5 drops into the right ear 2 (two) times daily. 15 mL 0   DULoxetine (CYMBALTA) 30 MG capsule Take 1 capsule (30 mg total) by mouth 2 (two) times daily. 180 capsule 1   ezetimibe (ZETIA) 10 MG tablet Take 1 tablet (10 mg total) by mouth daily. 90 tablet 3   fluticasone (FLONASE) 50 MCG/ACT nasal spray Place 1 spray into both nostrils daily. 18.2 mL 6   furosemide (LASIX) 20 MG tablet Take 1 tablet (20 mg total) by mouth in the morning. Take with potassium supplement 30 tablet 1   hydrALAZINE (APRESOLINE) 100 MG tablet Take 1 tablet (100 mg total) by mouth 3 (three) times daily. Monitor BP 90 tablet 1   indomethacin (INDOCIN) 25 MG capsule TAKE 2 TABLETS (50 MG) BY 3 TIMES DAILY FOR 2 DAYS AT ONSET OF GOUT FLARE UP, THEN TAKE 1('25MG'$ )TABLET BY MOUTH FOR 3 DAYS 20 capsule 3   insulin glargine (LANTUS SOLOSTAR) 100 UNIT/ML Solostar Pen Inject 20 Units into the skin at bedtime.     insulin lispro (HUMALOG) 100 UNIT/ML KwikPen Inject 1-45 Units into the skin 3 (three) times daily. Sliding scale up to 45 units per injection     ipratropium-albuterol  (DUONEB) 0.5-2.5 (3) MG/3ML SOLN Take 3 mLs by nebulization every 6 (six) hours as needed (SOB wheeze cough). 180 mL 1   levocetirizine (XYZAL) 5 MG tablet Take 1 tablet (5 mg total) by mouth daily. 30 tablet 11   lisinopril (ZESTRIL) 40 MG tablet Take 20 mg by mouth daily.     metFORMIN (GLUCOPHAGE) 1000 MG tablet TAKE 1 TABLET BY MOUTH TWICE A DAY WITH A MEAL 180 tablet  3   potassium chloride SA (KLOR-CON M) 20 MEQ tablet Take 1 tablet (20 mEq total) by mouth daily. Take with furosemide 30 tablet 3   predniSONE (DELTASONE) 20 MG tablet Take 2 tablets (40 mg total) by mouth daily with breakfast. 4 tablet 0   pregabalin (LYRICA) 25 MG capsule TAKE 1 CAPSULE BY MOUTH IN THE MORNING AND 1 TO 3 CAPSULES EVERY NIGHT FOR CERVICAL RADICULOPATHY AND NEUROPATHY 270 capsule 1   rosuvastatin (CRESTOR) 40 MG tablet Take 1 tablet (40 mg total) by mouth daily. 90 tablet 3   TRULICITY 3 UX/8.3FX SOPN Inject into the skin.     VENTOLIN HFA 108 (90 Base) MCG/ACT inhaler INHALE 2 PUFFS INTO THE LUNGS EVERY 6 HOURS AS NEEDED FOR WHEEZING OR SHORTNESS OF BREATH 18 g 2   nicotine polacrilex (NICOTINE MINI) 2 MG lozenge Take 1 lozenge (2 mg total) by mouth every 2 (two) hours as needed for smoking cessation. 72 lozenge 3   Olopatadine HCl 0.2 % SOLN Apply 1 drop to eye 2 (two) times daily as needed (eye allergies). (Patient not taking: Reported on 09/10/2022) 2.5 mL 3   No facility-administered medications prior to visit.

## 2022-09-24 NOTE — Telephone Encounter (Signed)
Rodena Piety and Dr. Genia Harold, please advise. Thanks

## 2022-09-25 NOTE — Telephone Encounter (Signed)
While the patient was in the office I called [facility] and they emailed a consent form from Cory Rivas to sign. He signed the form and I emailed it back to [facility] so they can mail him the [employer].  The Split Night was ordered 08/01/22 which insurance denied and because I knew I wouldn't be able to get the patient in before the end of the year I thought [facility] could

## 2022-10-01 ENCOUNTER — Other Ambulatory Visit: Payer: Self-pay | Admitting: Family Medicine

## 2022-10-01 DIAGNOSIS — G8929 Other chronic pain: Secondary | ICD-10-CM

## 2022-10-01 DIAGNOSIS — G959 Disease of spinal cord, unspecified: Secondary | ICD-10-CM

## 2022-10-01 DIAGNOSIS — M5412 Radiculopathy, cervical region: Secondary | ICD-10-CM

## 2022-10-01 DIAGNOSIS — M5136 Other intervertebral disc degeneration, lumbar region: Secondary | ICD-10-CM

## 2022-10-02 ENCOUNTER — Encounter: Payer: Self-pay | Admitting: Student in an Organized Health Care Education/Training Program

## 2022-10-02 DIAGNOSIS — T161XXA Foreign body in right ear, initial encounter: Secondary | ICD-10-CM | POA: Diagnosis not present

## 2022-10-02 DIAGNOSIS — H60331 Swimmer's ear, right ear: Secondary | ICD-10-CM | POA: Diagnosis not present

## 2022-10-02 DIAGNOSIS — H6191 Disorder of right external ear, unspecified: Secondary | ICD-10-CM | POA: Diagnosis not present

## 2022-10-02 NOTE — Telephone Encounter (Signed)
I have called [facility] asking them what the hold up is now to begin with it was the signed consent form which was signed while the patient was in our office on 09/24/22 and I emailed it back to [place] with [facility]

## 2022-10-03 NOTE — Telephone Encounter (Signed)
I just got off the phone with Alyssa with [facility] she stated that they have tried to contact the patient to get credit card information in order to mail the home sleep test machine to them.  It is a security thing to make the machine gets returned to [facility]

## 2022-10-05 ENCOUNTER — Ambulatory Visit: Payer: Medicaid Other | Attending: Student in an Organized Health Care Education/Training Program

## 2022-10-05 DIAGNOSIS — R0602 Shortness of breath: Secondary | ICD-10-CM | POA: Insufficient documentation

## 2022-10-05 DIAGNOSIS — R062 Wheezing: Secondary | ICD-10-CM | POA: Diagnosis not present

## 2022-10-05 LAB — PULMONARY FUNCTION TEST ARMC ONLY
DL/VA % pred: 98 %
DL/VA: 4.31 ml/min/mmHg/L
DLCO unc % pred: 82 %
DLCO unc: 21.47 ml/min/mmHg
FEF 25-75 Post: 2.21 L/sec
FEF 25-75 Pre: 1.31 L/sec
FEF2575-%Change-Post: 68 %
FEF2575-%Pred-Post: 74 %
FEF2575-%Pred-Pre: 44 %
FEV1-%Change-Post: 11 %
FEV1-%Pred-Post: 63 %
FEV1-%Pred-Pre: 56 %
FEV1-Post: 2.15 L
FEV1-Pre: 1.93 L
FEV1FVC-%Change-Post: -5 %
FEV1FVC-%Pred-Pre: 98 %
FEV6-%Change-Post: 18 %
FEV6-%Pred-Post: 70 %
FEV6-%Pred-Pre: 59 %
FEV6-Post: 3.01 L
FEV6-Pre: 2.54 L
FEV6FVC-%Change-Post: 0 %
FEV6FVC-%Pred-Post: 104 %
FEV6FVC-%Pred-Pre: 104 %
FVC-%Change-Post: 18 %
FVC-%Pred-Post: 67 %
FVC-%Pred-Pre: 57 %
FVC-Post: 3.01 L
FVC-Pre: 2.54 L
Post FEV1/FVC ratio: 71 %
Post FEV6/FVC ratio: 100 %
Pre FEV1/FVC ratio: 76 %
Pre FEV6/FVC Ratio: 100 %
RV % pred: 108 %
RV: 2.14 L
TLC % pred: 79 %
TLC: 5.07 L

## 2022-10-05 MED ORDER — ALBUTEROL SULFATE (2.5 MG/3ML) 0.083% IN NEBU
2.5000 mg | INHALATION_SOLUTION | Freq: Once | RESPIRATORY_TRACT | Status: AC
  Administered 2023-09-25: 4 mg via RESPIRATORY_TRACT
  Filled 2022-10-05: qty 3

## 2022-10-10 DIAGNOSIS — H60311 Diffuse otitis externa, right ear: Secondary | ICD-10-CM | POA: Diagnosis not present

## 2022-10-10 DIAGNOSIS — H6121 Impacted cerumen, right ear: Secondary | ICD-10-CM | POA: Diagnosis not present

## 2022-10-18 DIAGNOSIS — E1169 Type 2 diabetes mellitus with other specified complication: Secondary | ICD-10-CM | POA: Diagnosis not present

## 2022-10-18 DIAGNOSIS — Z794 Long term (current) use of insulin: Secondary | ICD-10-CM | POA: Diagnosis not present

## 2022-10-18 NOTE — Telephone Encounter (Signed)
Cory Rivas, can you assist with this?

## 2022-10-19 NOTE — Telephone Encounter (Signed)
I tried to call [facility] yesterday and could never get through.  I finally spoke with Tika with [facility] today and she stated that she spoke with Abigail Butts the sister yesterday and explained that due to 2 holidays shipping was behind and the HST machine should be mailed out next week

## 2022-10-20 NOTE — Progress Notes (Unsigned)
Patient ID: Cory Rivas, male    DOB: [birth year], 57 y.o.   MRN: [id number]  HPI  Mr Mimbs is a 57 y/o male with a history of DM, hyperlipidemia, HTN, gout, tobacco use and chronic heart failure.   Echo report from 08/23/22 showed an EF of 60-65% along with mild LVH & mild MR.   Admitted 08/23/22 due to dyspnea, productive cough of brown sputum, mild sore throat, fatigue, dizziness and chest soreness. Steroid burst given. Initially given IV lasix with transition to oral diuretics. Lisinopril held due to AKI. Discharged after 2 days. Was in the ED 08/15/22 due to acute pulmonary edema where he was treated and released.    He presents today for a follow-up visit with a chief complaint of minimal fatigue with moderate exertion. Describes this as chronic in nature. Has associated shortness of breath, chest tightness, neck pain and chronic difficulty sleeping along with this. Denies any abdominal distention, palpitations, pedal edema, chest pain, wheezing, cough, dizziness or weight gain.   Brought some of his medications and an old med list. Admits that he often forgets to take his evening medications because he falls asleep.   Finishing up prednisone for some ear issues and has noticed that his glucose is running higher than previously.   Past Medical History:  Diagnosis Date   Bulging of cervical intervertebral disc    CHF (congestive heart failure) (HCC)    Diabetes mellitus without complication (HCC)    Gout    High cholesterol    Hypertension    Stress due to family tension 04/15/2019   Past Surgical History:  Procedure Laterality Date   CHOLECYSTECTOMY     COLONOSCOPY WITH PROPOFOL N/A 03/10/2020   Procedure: COLONOSCOPY WITH PROPOFOL;  Surgeon: Lucilla Lame, MD;  Location: [facility] ENDOSCOPY;  Service: Endoscopy;  Laterality: N/A;   Family History  Problem Relation Age of Onset   Heart disease Mother    Diabetes Mother    Hyperlipidemia Mother    Hypertension Mother    Heart  attack Mother    Lung cancer Father    Hypertension Father    Stroke Father    AAA (abdominal aortic aneurysm) Maternal Grandmother    Heart attack Maternal Grandmother    Diabetes Maternal Grandfather    Social History   Tobacco Use   Smoking status: Former    Packs/day: 1.00    Years: 37.00    Total pack years: 37.00    Types: Cigarettes    Start date: 10/19/1989    Quit date: 08/20/2022    Years since quitting: 0.1   Smokeless tobacco: Never   Tobacco comments:    30+ years hx smoking 2 ppd, stopped smoking briefly in April 2022 for a surgery and then restarted smoking about 1/2 ppd last 4-5 months     Quit smoking 08/20/2022  Substance Use Topics   Alcohol use: No   Allergies  Allergen Reactions   Oxycodone Nausea And Vomiting   Onion Nausea And Vomiting   Prior to Admission medications   Medication Sig Start Date End Date Taking? Authorizing Provider  allopurinol (ZYLOPRIM) 100 MG tablet TAKE 1 TABLET BY MOUTH ONCE A DAY. TAKE WITH 300 MG TABLETS 08/30/22  Yes Delsa Grana, PA-C  allopurinol (ZYLOPRIM) 300 MG tablet Take one 300 mg tab po daily WITH 100 mg tab for total of 400 mg once daily for gout 08/30/22  Yes Delsa Grana, PA-C  amLODipine (NORVASC) 10 MG tablet Take 1 tablet (10  mg total) by mouth daily. 01/26/22  Yes Delsa Grana, PA-C  DULoxetine (CYMBALTA) 30 MG capsule TAKE 1 CAPSULE BY MOUTH 2 TIMES DAILY 10/01/22  Yes Delsa Grana, PA-C  ezetimibe (ZETIA) 10 MG tablet Take 1 tablet (10 mg total) by mouth daily. 03/06/22  Yes Delsa Grana, PA-C  fluticasone (FLONASE) 50 MCG/ACT nasal spray Place 1 spray into both nostrils daily. 08/01/22 08/01/23 Yes Dgayli, Berdine Addison, MD  furosemide (LASIX) 20 MG tablet Take 1 tablet (20 mg total) by mouth in the morning. Take with potassium supplement 08/28/22  Yes Delsa Grana, PA-C  hydrALAZINE (APRESOLINE) 100 MG tablet Take 1 tablet (100 mg total) by mouth 3 (three) times daily. Monitor BP 08/07/22  Yes Agbor-Etang, Aaron Edelman, MD   indomethacin (INDOCIN) 25 MG capsule TAKE 2 TABLETS (50 MG) BY 3 TIMES DAILY FOR 2 DAYS AT ONSET OF GOUT FLARE UP, THEN TAKE 1('25MG'$ )TABLET BY MOUTH FOR 3 DAYS 09/05/21  Yes Delsa Grana, PA-C  insulin glargine (LANTUS SOLOSTAR) 100 UNIT/ML Solostar Pen Inject 25 Units into the skin at bedtime. 11/01/20  Yes [provider]  insulin lispro (HUMALOG) 100 UNIT/ML KwikPen Inject 1-45 Units into the skin 3 (three) times daily. Sliding scale up to 45 units per injection 11/08/20  Yes [provider]  ipratropium-albuterol (DUONEB) 0.5-2.5 (3) MG/3ML SOLN Take 3 mLs by nebulization every 6 (six) hours as needed (SOB wheeze cough). 07/23/22  Yes Delsa Grana, PA-C  levocetirizine (XYZAL) 5 MG tablet Take 1 tablet (5 mg total) by mouth daily. 08/01/22 08/01/23 Yes Dgayli, Berdine Addison, MD  lisinopril (ZESTRIL) 40 MG tablet Take 40 mg by mouth daily.   Yes [provider]  metFORMIN (GLUCOPHAGE) 1000 MG tablet TAKE 1 TABLET BY MOUTH TWICE A DAY WITH A MEAL 03/06/22  Yes Delsa Grana, PA-C  nicotine polacrilex (NICOTINE MINI) 2 MG lozenge Take 1 lozenge (2 mg total) by mouth every 2 (two) hours as needed for smoking cessation. 09/24/22 12/23/22 Yes Dgayli, Berdine Addison, MD  potassium chloride SA (KLOR-CON M) 20 MEQ tablet Take 1 tablet (20 mEq total) by mouth daily. Take with furosemide 08/28/22  Yes Delsa Grana, PA-C  predniSONE (DELTASONE) 20 MG tablet Take 2 tablets (40 mg total) by mouth daily with breakfast. 08/26/22  Yes Wouk, Ailene Rud, MD  pregabalin (LYRICA) 25 MG capsule TAKE 1 CAPSULE BY MOUTH IN THE MORNING AND 1 TO 3 CAPSULES EVERY NIGHT FOR CERVICAL RADICULOPATHY AND NEUROPATHY 07/10/22  Yes Delsa Grana, PA-C  rosuvastatin (CRESTOR) 40 MG tablet Take 1 tablet (40 mg total) by mouth daily. 01/26/22  Yes Delsa Grana, PA-C  Tiotropium Bromide Monohydrate (SPIRIVA RESPIMAT) 2.5 MCG/ACT AERS Inhale 2 puffs into the lungs daily. 09/24/22  Yes Dgayli, Berdine Addison, MD  TRULICITY 3 OZ/3.0QM SOPN  Inject into the skin. 03/26/22  Yes [provider]  VENTOLIN HFA 108 (90 Base) MCG/ACT inhaler INHALE 2 PUFFS INTO THE LUNGS EVERY 6 HOURS AS NEEDED FOR WHEEZING OR SHORTNESS OF BREATH 04/05/22  Yes Delsa Grana, PA-C  budesonide-formoterol (SYMBICORT) 160-4.5 MCG/ACT inhaler Inhale 2 puffs into the lungs 2 (two) times daily. Patient not taking: Reported on 10/22/2022 08/01/22   Armando Reichert, MD  carbamide peroxide (DEBROX) 6.5 % OTIC solution Place 5 drops into the right ear 2 (two) times daily. Patient not taking: Reported on 10/22/2022 09/20/22   Bo Merino, FNP  Olopatadine HCl 0.2 % SOLN Apply 1 drop to eye 2 (two) times daily as needed (eye allergies). Patient not taking: Reported on 09/10/2022 01/29/22   Lucio Edward,  Kristeen Miss, PA-C    Review of Systems  Constitutional:  Positive for fatigue (minimal). Negative for appetite change.  HENT:  Negative for congestion, postnasal drip and sore throat.   Eyes: Negative.   Respiratory:  Positive for chest tightness (with SOB) and shortness of breath (minimal). Negative for cough and wheezing.   Cardiovascular:  Negative for chest pain, palpitations and leg swelling.  Gastrointestinal:  Negative for abdominal distention and abdominal pain.  Endocrine: Negative.   Genitourinary: Negative.   Musculoskeletal:  Positive for neck pain. Negative for back pain.  Skin: Negative.   Allergic/Immunologic: Negative.   Neurological:  Negative for dizziness and light-headedness.  Hematological:  Negative for adenopathy. Does not bruise/bleed easily.  Psychiatric/Behavioral:  Positive for sleep disturbance (trouble sleeping due to neck tightness; sleeping on couch due to comfort). Negative for dysphoric mood. The patient is not nervous/anxious.    Vitals:   10/22/22 1022  BP: (!) 193/99  Pulse: 64  Resp: 20  SpO2: 97%  Weight: 232 lb (105.2 kg)   Wt Readings from Last 3 Encounters:  10/22/22 232 lb (105.2 kg)  09/24/22 232 lb 6.4 oz (105.4 kg)   09/21/22 233 lb 3.2 oz (105.8 kg)   Lab Results  Component Value Date   CREATININE 1.43 (H) 09/10/2022   CREATININE 1.32 (H) 08/30/2022   CREATININE 1.68 (H) 08/25/2022   CREATININE 1.64 (H) 08/25/2022   Physical Exam Vitals and nursing note reviewed.  Constitutional:      Appearance: He is well-developed.  HENT:     Head: Normocephalic and atraumatic.  Neck:     Vascular: No JVD.  Cardiovascular:     Rate and Rhythm: Normal rate and regular rhythm.  Pulmonary:     Effort: Pulmonary effort is normal.     Breath sounds: No wheezing, rhonchi or rales.  Abdominal:     Palpations: Abdomen is soft.     Tenderness: There is no abdominal tenderness.  Musculoskeletal:     Cervical back: Normal range of motion and neck supple.     Right lower leg: No tenderness. No edema.     Left lower leg: No tenderness. No edema.  Skin:    General: Skin is warm and dry.  Neurological:     General: No focal deficit present.     Mental Status: He is alert and oriented to person, place, and time.  Psychiatric:        Mood and Affect: Mood normal.        Behavior: Behavior normal.    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)- - NYHA class II - euvolemic today - weighing daily; reminded to call for an overnight weight gain of >2 pounds or a weekly weight gain of > 5 pounds - weight up 6 pounds from last visit here 6 weeks ago - on GDMT of lisinopril  - begin jardiance '10mg'$  daily; voucher and 2 weeks samples provided - BMP next visit - he uses pill packs so can't say with certainty what he's taking and he didn't bring them with him - consider changing his lisinopril to entresto but he says today that he often forgets PM doses of meds so this could be an issue; may change lisinopril to valsartan if he continues to forget his PM meds - saw cardiology (Agbor-Etang) 09/21/22 - BNP 08/23/22 was 164.0 - has received his flu vaccine  for this season - PharmD reconciled meds w/ patient  2: HTN- - BP 193/99; admits that he often forgets evening dose of meds due to falling asleep - took AM meds right before today's appt - checks BP at home; emphasized writing BP readings down and bringing log to next visit - saw PCP Reece Packer) 09/20/22 - BMP 09/10/22 reviewed and showed sodium 139, potassium 4.4, creatinine 1.43 and GFR 58  3: DM- - A1c 08/23/22 was 7.5% - currently on trulicity, metformin and insulin - had telemedicine visit with endocrinology 10/18/22 - home glucose this morning was 176; reports it running higher due to prednisone usage  4: Reactive airway disease- - saw pulmonology (Dgayli) 09/24/22 - using spiriva & xyzal - hasn't smoked in ~ 7 weeks   Patient brought some of his medications and an incorrect list with him. Instructed to take today's list and compare with his medications at home and let us know if anything is incorrect. Emphasized bringing all his meds with him.   Return in 1 month, sooner if needed.

## 2022-10-22 ENCOUNTER — Ambulatory Visit: Payer: Medicaid Other | Attending: Family | Admitting: Family

## 2022-10-22 ENCOUNTER — Encounter: Payer: Self-pay | Admitting: Pharmacy Technician

## 2022-10-22 ENCOUNTER — Other Ambulatory Visit (HOSPITAL_COMMUNITY): Payer: Self-pay

## 2022-10-22 ENCOUNTER — Encounter: Payer: Self-pay | Admitting: Family

## 2022-10-22 ENCOUNTER — Telehealth (HOSPITAL_COMMUNITY): Payer: Self-pay | Admitting: Pharmacy Technician

## 2022-10-22 VITALS — BP 193/99 | HR 64 | Resp 20 | Wt 232.0 lb

## 2022-10-22 DIAGNOSIS — Z794 Long term (current) use of insulin: Secondary | ICD-10-CM

## 2022-10-22 DIAGNOSIS — Z79899 Other long term (current) drug therapy: Secondary | ICD-10-CM | POA: Insufficient documentation

## 2022-10-22 DIAGNOSIS — E785 Hyperlipidemia, unspecified: Secondary | ICD-10-CM | POA: Diagnosis not present

## 2022-10-22 DIAGNOSIS — G479 Sleep disorder, unspecified: Secondary | ICD-10-CM | POA: Diagnosis not present

## 2022-10-22 DIAGNOSIS — J45909 Unspecified asthma, uncomplicated: Secondary | ICD-10-CM | POA: Diagnosis not present

## 2022-10-22 DIAGNOSIS — I10 Essential (primary) hypertension: Secondary | ICD-10-CM | POA: Diagnosis not present

## 2022-10-22 DIAGNOSIS — M542 Cervicalgia: Secondary | ICD-10-CM | POA: Insufficient documentation

## 2022-10-22 DIAGNOSIS — I5030 Unspecified diastolic (congestive) heart failure: Secondary | ICD-10-CM

## 2022-10-22 DIAGNOSIS — R0789 Other chest pain: Secondary | ICD-10-CM | POA: Insufficient documentation

## 2022-10-22 DIAGNOSIS — Z87891 Personal history of nicotine dependence: Secondary | ICD-10-CM | POA: Diagnosis not present

## 2022-10-22 DIAGNOSIS — I110 Hypertensive heart disease with heart failure: Secondary | ICD-10-CM | POA: Diagnosis not present

## 2022-10-22 DIAGNOSIS — Z7984 Long term (current) use of oral hypoglycemic drugs: Secondary | ICD-10-CM | POA: Insufficient documentation

## 2022-10-22 DIAGNOSIS — E119 Type 2 diabetes mellitus without complications: Secondary | ICD-10-CM | POA: Diagnosis not present

## 2022-10-22 DIAGNOSIS — R0602 Shortness of breath: Secondary | ICD-10-CM | POA: Diagnosis not present

## 2022-10-22 DIAGNOSIS — I5032 Chronic diastolic (congestive) heart failure: Secondary | ICD-10-CM | POA: Diagnosis not present

## 2022-10-22 DIAGNOSIS — M109 Gout, unspecified: Secondary | ICD-10-CM | POA: Diagnosis not present

## 2022-10-22 MED ORDER — EMPAGLIFLOZIN 10 MG PO TABS: 10.0000 mg | ORAL_TABLET | Freq: Every day | ORAL | 3 refills | Status: DC

## 2022-10-22 NOTE — Progress Notes (Signed)
[place] - PHARMACIST COUNSELING NOTE  Guideline-Directed Medical Therapy/Evidence Based Medicine  ACE/ARB/ARNI: Lisinopril 40 mg daily Beta Blocker:  None Aldosterone Antagonist:  None Diuretic: Furosemide 20 mg daily SGLT2i:  None  Adherence Assessment  Do you ever forget to take your medication? '[]'$ Yes '[x]'$ No  Do you ever skip doses due to side effects? '[]'$ Yes '[x]'$ No  Do you have trouble affording your medicines? '[]'$ Yes '[x]'$ No  Are you ever unable to pick up your medication due to transportation difficulties? '[]'$ Yes '[x]'$ No  Do you ever stop taking your medications because you don't believe they are helping? '[]'$ Yes '[x]'$ No  Do you check your weight daily? '[x]'$ Yes '[]'$ No   Adherence strategy: Keeps a list with current medications so he is able to know what he's taking.  Barriers to obtaining medications: None  Vital signs: HR 64, BP 193/99, weight (pounds) 233 ECHO: Date 08/23/2022, EF 60-65, notes Mild LVH but no regional wall motion abnormalities     Latest Ref Rng & Units 09/10/2022   11:02 AM 08/30/2022    8:50 AM 08/25/2022    5:35 AM  BMP  Glucose 70 - 99 mg/dL 97  125  113    115   BUN 6 - 20 mg/dL '23  22  28    28   '$ Creatinine 0.61 - 1.24 mg/dL 1.43  1.32  1.68    1.64   BUN/Creat Ratio 6 - 22 (calc)  17    Sodium 135 - 145 mmol/L 139  139  140    140   Potassium 3.5 - 5.1 mmol/L 4.4  4.4  3.5    3.5   Chloride 98 - 111 mmol/L 103  98  106    105   CO2 22 - 32 mmol/L '26  31  25    26   '$ Calcium 8.9 - 10.3 mg/dL 9.9  9.2  8.7    8.6     Past Medical History:  Diagnosis Date   Bulging of cervical intervertebral disc    CHF (congestive heart failure) (HCC)    Diabetes mellitus without complication (HCC)    Gout    High cholesterol    Hypertension    Stress due to family tension 04/15/2019    ASSESSMENT 57 year old male with PMH HTN, HLD, T2DM, CKD, DDD, and spinal osteoarthritis who presents to the HF clinic as  an established patient. Patient has presented to ED several times the past 6 months for shortness of breath. Regarding heart failure medications, patient is taking lisinopril 40 mg and furosemide 20 mg daily.  Recent ED Visit (past 6 months):  Date - 08/23/2022, CC - AoCHF Date - 08/15/2022, CC - SOB Date - 07/24/2022, CC - SOB Date - 06/30/2022, CC - SOB Date - 05/08/2022, CC - Concussion Date - 04/01/2022, CC - Acute respiratory failure Date - 03/29/2022, CC - SOB  PLAN CHF/HTN: Initiate empagliflozin 10 mg daily. Prior authorization has already been approved through insurance ($4 copay). Patient is currently taking prednisone for an inner ear problem,and the prescription is expected to continue until at least next week. As a result, his blood sugar readings have been higher. Empagliflozin will help with both heart failure and diabetes.  Time spent: 20 minutes  Will M. Ouida Sills, PharmD PGY-1 Pharmacy Resident 10/22/2022 12:15 PM  Current Outpatient Medications:    allopurinol (ZYLOPRIM) 100 MG tablet, TAKE 1 TABLET BY MOUTH ONCE A DAY. TAKE WITH 300 MG TABLETS, Disp: 90  tablet, Rfl: 1   allopurinol (ZYLOPRIM) 300 MG tablet, Take one 300 mg tab po daily WITH 100 mg tab for total of 400 mg once daily for gout, Disp: 90 tablet, Rfl: 1   amLODipine (NORVASC) 10 MG tablet, Take 1 tablet (10 mg total) by mouth daily., Disp: 90 tablet, Rfl: 3   budesonide-formoterol (SYMBICORT) 160-4.5 MCG/ACT inhaler, Inhale 2 puffs into the lungs 2 (two) times daily. (Patient not taking: Reported on 10/22/2022), Disp: 1 each, Rfl: 12   carbamide peroxide (DEBROX) 6.5 % OTIC solution, Place 5 drops into the right ear 2 (two) times daily. (Patient not taking: Reported on 10/22/2022), Disp: 15 mL, Rfl: 0   DULoxetine (CYMBALTA) 30 MG capsule, TAKE 1 CAPSULE BY MOUTH 2 TIMES DAILY, Disp: 180 capsule, Rfl: 1   empagliflozin (JARDIANCE) 10 MG TABS tablet, Take 1 tablet (10 mg total) by mouth daily before breakfast. Bringing  voucher, Disp: 14 tablet, Rfl: 3   ezetimibe (ZETIA) 10 MG tablet, Take 1 tablet (10 mg total) by mouth daily., Disp: 90 tablet, Rfl: 3   fluticasone (FLONASE) 50 MCG/ACT nasal spray, Place 1 spray into both nostrils daily., Disp: 18.2 mL, Rfl: 6   furosemide (LASIX) 20 MG tablet, Take 1 tablet (20 mg total) by mouth in the morning. Take with potassium supplement, Disp: 30 tablet, Rfl: 1   hydrALAZINE (APRESOLINE) 100 MG tablet, Take 1 tablet (100 mg total) by mouth 3 (three) times daily. Monitor BP, Disp: 90 tablet, Rfl: 1   indomethacin (INDOCIN) 25 MG capsule, TAKE 2 TABLETS (50 MG) BY 3 TIMES DAILY FOR 2 DAYS AT ONSET OF GOUT FLARE UP, THEN TAKE 1('25MG'$ )TABLET BY MOUTH FOR 3 DAYS, Disp: 20 capsule, Rfl: 3   insulin glargine (LANTUS SOLOSTAR) 100 UNIT/ML Solostar Pen, Inject 25 Units into the skin at bedtime., Disp: , Rfl:    insulin lispro (HUMALOG) 100 UNIT/ML KwikPen, Inject 1-45 Units into the skin 3 (three) times daily. Sliding scale up to 45 units per injection, Disp: , Rfl:    ipratropium-albuterol (DUONEB) 0.5-2.5 (3) MG/3ML SOLN, Take 3 mLs by nebulization every 6 (six) hours as needed (SOB wheeze cough)., Disp: 180 mL, Rfl: 1   levocetirizine (XYZAL) 5 MG tablet, Take 1 tablet (5 mg total) by mouth daily., Disp: 30 tablet, Rfl: 11   lisinopril (ZESTRIL) 40 MG tablet, Take 40 mg by mouth daily., Disp: , Rfl:    metFORMIN (GLUCOPHAGE) 1000 MG tablet, TAKE 1 TABLET BY MOUTH TWICE A DAY WITH A MEAL, Disp: 180 tablet, Rfl: 3   nicotine polacrilex (NICOTINE MINI) 2 MG lozenge, Take 1 lozenge (2 mg total) by mouth every 2 (two) hours as needed for smoking cessation., Disp: 72 lozenge, Rfl: 6   Olopatadine HCl 0.2 % SOLN, Apply 1 drop to eye 2 (two) times daily as needed (eye allergies). (Patient not taking: Reported on 09/10/2022), Disp: 2.5 mL, Rfl: 3   potassium chloride SA (KLOR-CON M) 20 MEQ tablet, Take 1 tablet (20 mEq total) by mouth daily. Take with furosemide, Disp: 30 tablet, Rfl: 3    predniSONE (DELTASONE) 20 MG tablet, Take 2 tablets (40 mg total) by mouth daily with breakfast., Disp: 4 tablet, Rfl: 0   pregabalin (LYRICA) 25 MG capsule, TAKE 1 CAPSULE BY MOUTH IN THE MORNING AND 1 TO 3 CAPSULES EVERY NIGHT FOR CERVICAL RADICULOPATHY AND NEUROPATHY, Disp: 270 capsule, Rfl: 1   rosuvastatin (CRESTOR) 40 MG tablet, Take 1 tablet (40 mg total) by mouth daily., Disp: 90 tablet, Rfl: 3  Tiotropium Bromide Monohydrate (SPIRIVA RESPIMAT) 2.5 MCG/ACT AERS, Inhale 2 puffs into the lungs daily., Disp: 30 each, Rfl: 11   TRULICITY 3 BT/5.1VO SOPN, Inject into the skin., Disp: , Rfl:    VENTOLIN HFA 108 (90 Base) MCG/ACT inhaler, INHALE 2 PUFFS INTO THE LUNGS EVERY 6 HOURS AS NEEDED FOR WHEEZING OR SHORTNESS OF BREATH, Disp: 18 g, Rfl: 2 No current facility-administered medications for this visit.  Facility-Administered Medications Ordered in Other Visits:    albuterol (PROVENTIL) (2.5 MG/3ML) 0.083% nebulizer solution 2.5 mg, 2.5 mg, Nebulization, Once, Dgayli, Berdine Addison, MD   DRUGS TO CAUTION IN HEART FAILURE  Drug or Class Mechanism  Analgesics NSAIDs COX-2 inhibitors Glucocorticoids  Sodium and water retention, increased systemic vascular resistance, decreased response to diuretics   Diabetes Medications Metformin Thiazolidinediones Rosiglitazone (Avandia) Pioglitazone (Actos) DPP4 Inhibitors Saxagliptin (Onglyza) Sitagliptin (Januvia)   Lactic acidosis Possible calcium channel blockade   Unknown  Antiarrhythmics Class I  Flecainide Disopyramide Class III Sotalol Other Dronedarone  Negative inotrope, proarrhythmic   Proarrhythmic, beta blockade  Negative inotrope  Antihypertensives Alpha Blockers Doxazosin Calcium Channel Blockers Diltiazem Verapamil Nifedipine Central Alpha Adrenergics Moxonidine Peripheral Vasodilators Minoxidil  Increases renin and aldosterone  Negative inotrope    Possible sympathetic withdrawal  Unknown   Anti-infective Itraconazole Amphotericin B  Negative inotrope Unknown  Hematologic Anagrelide Cilostazol   Possible inhibition of PD IV Inhibition of PD III causing arrhythmias  Neurologic/Psychiatric Stimulants Anti-Seizure Drugs Carbamazepine Pregabalin Antidepressants Tricyclics Citalopram Parkinsons Bromocriptine Pergolide Pramipexole Antipsychotics Clozapine Antimigraine Ergotamine Methysergide Appetite suppressants Bipolar Lithium  Peripheral alpha and beta agonist activity  Negative inotrope and chronotrope Calcium channel blockade  Negative inotrope, proarrhythmic Dose-dependent QT prolongation  Excessive serotonin activity/valvular damage Excessive serotonin activity/valvular damage Unknown  IgE mediated hypersensitivy, calcium channel blockade  Excessive serotonin activity/valvular damage Excessive serotonin activity/valvular damage Valvular damage  Direct myofibrillar degeneration, adrenergic stimulation  Antimalarials Chloroquine Hydroxychloroquine Intracellular inhibition of lysosomal enzymes  Urologic Agents Alpha Blockers Doxazosin Prazosin Tamsulosin Terazosin  Increased renin and aldosterone  Adapted from Page Carleene Overlie, et al. "Drugs That May Cause or Exacerbate Heart Failure: A Scientific Statement from the American Heart  Association." Circulation 2016; 134:e32-e69. DOI: 10.1161/CIR.0000000000000426   MEDICATION ADHERENCES TIPS AND STRATEGIES Taking medication as prescribed improves patient outcomes in heart failure (reduces hospitalizations, improves symptoms, increases survival) Side effects of medications can be managed by decreasing doses, switching agents, stopping drugs, or adding additional therapy. Please let someone in the [facility] know if you have having bothersome side effects so we can modify your regimen. Do not alter your medication regimen without talking to [place].  Medication reminders can help patients remember  to take drugs on time. If you are missing or forgetting doses you can try linking behaviors, using pill boxes, or an electronic reminder like an alarm on your phone or an app. Some people can also get automated phone calls as medication reminders.

## 2022-10-22 NOTE — TOC Benefit Eligibility Note (Signed)
Patient Teacher, English as a foreign language completed.    The patient is currently admitted and upon discharge could be taking Farxiga 10 mg.  Prior Authorization Required  The patient is currently admitted and upon discharge could be taking Jardiance 10 mg.  Prior Authorization Required  The patient is insured through [place], [place] Patient Advocate Specialist [place] Patient Advocate Team Direct Number: [phone]  Fax: [phone]

## 2022-10-22 NOTE — Telephone Encounter (Signed)
Patient Advocate Encounter  Prior Authorization for Jardiance '10MG'$  tablets has been approved.    PA# [id number] Key: [id number] Effective dates: 10/22/2022 through 10/22/2023  Patients co-pay is $4.00.     Lyndel Safe, [place] Patient Advocate Specialist [place] Patient Advocate Team Direct Number: [phone]  Fax: [phone]

## 2022-10-22 NOTE — Patient Instructions (Addendum)
Continue weighing daily and call for an overnight weight gain of 3 pounds or more or a weekly weight gain of more than 5 pounds.   If you have voicemail, please make sure your mailbox is cleaned out so that we may leave a message and please make sure to listen to any voicemails.    Start taking jardiance as 1 tablet every morning.    Bring all your medications with you to every visit along with bringing your blood pressure readings.

## 2022-10-23 ENCOUNTER — Other Ambulatory Visit: Payer: Self-pay | Admitting: Family Medicine

## 2022-10-24 DIAGNOSIS — H903 Sensorineural hearing loss, bilateral: Secondary | ICD-10-CM | POA: Diagnosis not present

## 2022-10-29 DIAGNOSIS — G4733 Obstructive sleep apnea (adult) (pediatric): Secondary | ICD-10-CM | POA: Diagnosis not present

## 2022-11-01 ENCOUNTER — Encounter: Payer: Self-pay | Admitting: Podiatry

## 2022-11-01 ENCOUNTER — Ambulatory Visit: Payer: Medicaid Other | Admitting: Podiatry

## 2022-11-01 DIAGNOSIS — B351 Tinea unguium: Secondary | ICD-10-CM

## 2022-11-01 DIAGNOSIS — E0843 Diabetes mellitus due to underlying condition with diabetic autonomic (poly)neuropathy: Secondary | ICD-10-CM | POA: Diagnosis not present

## 2022-11-01 DIAGNOSIS — N182 Chronic kidney disease, stage 2 (mild): Secondary | ICD-10-CM | POA: Diagnosis not present

## 2022-11-01 DIAGNOSIS — M79674 Pain in right toe(s): Secondary | ICD-10-CM

## 2022-11-01 DIAGNOSIS — M79675 Pain in left toe(s): Secondary | ICD-10-CM | POA: Diagnosis not present

## 2022-11-01 NOTE — Progress Notes (Signed)
This patient returns to my office for at risk foot care.  This patient requires this care by a professional since this patient will be at risk due to having diabetes and CKD.  This patient is unable to cut nails himself since the patient cannot reach his nails.These nails are painful walking and wearing shoes.  This patient presents for at risk foot care today.  General Appearance  Alert, conversant and in no acute stress.  Vascular  Dorsalis pedis and posterior tibial  pulses are palpable  bilaterally.  Capillary return is within normal limits  bilaterally. Temperature is within normal limits  bilaterally.  Neurologic  Senn-Weinstein monofilament wire test within normal limits  bilaterally. Muscle power within normal limits bilaterally.  Nails Thick disfigured discolored nails with subungual debris  from hallux to fifth toes bilaterally. No evidence of bacterial infection or drainage bilaterally.  Orthopedic  No limitations of motion  feet .  No crepitus or effusions noted.  No bony pathology or digital deformities noted.  Skin  normotropic skin with no porokeratosis noted bilaterally.  No signs of infections or ulcers noted.     Onychomycosis  Pain in right toes  Pain in left toes  Consent was obtained for treatment procedures.   Mechanical debridement of nails 1-5  bilaterally performed with a nail nipper.  Filed with dremel without incident.    Return office visit   3 months                   Told patient to return for periodic foot care and evaluation due to potential at risk complications.   Gardiner Barefoot DPM

## 2022-11-12 ENCOUNTER — Ambulatory Visit: Payer: Medicaid Other | Admitting: Podiatry

## 2022-11-13 ENCOUNTER — Ambulatory Visit: Payer: Medicaid Other | Admitting: Physician Assistant

## 2022-11-13 ENCOUNTER — Encounter: Payer: Self-pay | Admitting: Physician Assistant

## 2022-11-13 VITALS — BP 198/100 | HR 80 | Temp 98.2°F | Resp 16 | Ht 67.0 in | Wt 231.9 lb

## 2022-11-13 DIAGNOSIS — J020 Streptococcal pharyngitis: Secondary | ICD-10-CM | POA: Diagnosis not present

## 2022-11-13 DIAGNOSIS — J029 Acute pharyngitis, unspecified: Secondary | ICD-10-CM | POA: Diagnosis not present

## 2022-11-13 DIAGNOSIS — R0981 Nasal congestion: Secondary | ICD-10-CM | POA: Diagnosis not present

## 2022-11-13 DIAGNOSIS — R059 Cough, unspecified: Secondary | ICD-10-CM

## 2022-11-13 LAB — POCT INFLUENZA A/B
Influenza A, POC: NEGATIVE
Influenza B, POC: NEGATIVE

## 2022-11-13 LAB — POCT RAPID STREP A (OFFICE): Rapid Strep A Screen: POSITIVE — AB

## 2022-11-13 MED ORDER — BENZONATATE 200 MG PO CAPS: 200.0000 mg | ORAL_CAPSULE | Freq: Two times a day (BID) | ORAL | 0 refills | Status: DC | PRN

## 2022-11-13 MED ORDER — AMOXICILLIN 500 MG PO CAPS: 500.0000 mg | ORAL_CAPSULE | Freq: Two times a day (BID) | ORAL | 0 refills | Status: DC

## 2022-11-13 NOTE — Progress Notes (Signed)
Acute Office Visit   Patient: Cory Rivas   DOB: 02-05-1966   57 y.o. Male  MRN: [id number] Visit Date: 11/13/2022  Today's healthcare provider: Dani Gobble Nikkia Devoss, PA-C  Introduced myself to the patient as a Journalist, newspaper and provided education on APPs in clinical practice.    Chief Complaint  Patient presents with   Sore Throat   Cough   Nasal Congestion   Subjective    HPI    URI-type symptoms Onset: sudden Duration: started about 2 days ago Associated symptoms: productive coughing, rhinorrhea, sore throat, congestion, SOB, chest tightness  Interventions: Cold medicine?- likely Coricidin as he says its formulated for patients with HTN and DM, benadryl Recent sick contacts: none to his knowledge COVID testing at home: has not tested at home  He has been using his inhalers as directed He had to use his rescue inhaler 3-4 times last night and once this AM   He checks his blood sugars regularly at home- avg is 120s-130s  States he needs to get replacement CGM  Medications: Outpatient Medications Prior to Visit  Medication Sig   allopurinol (ZYLOPRIM) 100 MG tablet TAKE 1 TABLET BY MOUTH ONCE A DAY. TAKE WITH 300 MG TABLETS   allopurinol (ZYLOPRIM) 300 MG tablet Take one 300 mg tab po daily WITH 100 mg tab for total of 400 mg once daily for gout   amLODipine (NORVASC) 10 MG tablet Take 1 tablet (10 mg total) by mouth daily.   DULoxetine (CYMBALTA) 30 MG capsule TAKE 1 CAPSULE BY MOUTH 2 TIMES DAILY   empagliflozin (JARDIANCE) 10 MG TABS tablet Take 1 tablet (10 mg total) by mouth daily before breakfast. Bringing voucher   ezetimibe (ZETIA) 10 MG tablet Take 1 tablet (10 mg total) by mouth daily.   fluticasone (FLONASE) 50 MCG/ACT nasal spray Place 1 spray into both nostrils daily.   furosemide (LASIX) 20 MG tablet TAKE 1 TABLET BY MOUTH DAILY. TAKE WITH POTASSIUM.   hydrALAZINE (APRESOLINE) 100 MG tablet Take 1 tablet (100 mg total) by mouth 3 (three) times daily. Monitor  BP   indomethacin (INDOCIN) 25 MG capsule TAKE 2 TABLETS (50 MG) BY 3 TIMES DAILY FOR 2 DAYS AT ONSET OF GOUT FLARE UP, THEN TAKE 1('25MG'$ )TABLET BY MOUTH FOR 3 DAYS   insulin glargine (LANTUS SOLOSTAR) 100 UNIT/ML Solostar Pen Inject 25 Units into the skin at bedtime.   insulin lispro (HUMALOG) 100 UNIT/ML KwikPen Inject 1-45 Units into the skin 3 (three) times daily. Sliding scale up to 45 units per injection   ipratropium-albuterol (DUONEB) 0.5-2.5 (3) MG/3ML SOLN Take 3 mLs by nebulization every 6 (six) hours as needed (SOB wheeze cough).   levocetirizine (XYZAL) 5 MG tablet Take 1 tablet (5 mg total) by mouth daily.   lisinopril (ZESTRIL) 40 MG tablet Take 40 mg by mouth daily.   metFORMIN (GLUCOPHAGE) 1000 MG tablet TAKE 1 TABLET BY MOUTH TWICE A DAY WITH A MEAL   nicotine polacrilex (NICOTINE MINI) 2 MG lozenge Take 1 lozenge (2 mg total) by mouth every 2 (two) hours as needed for smoking cessation.   Olopatadine HCl 0.2 % SOLN Apply 1 drop to eye 2 (two) times daily as needed (eye allergies).   potassium chloride SA (KLOR-CON M) 20 MEQ tablet Take 1 tablet (20 mEq total) by mouth daily. Take with furosemide   predniSONE (DELTASONE) 20 MG tablet Take 2 tablets (40 mg total) by mouth daily with breakfast.   pregabalin (  LYRICA) 25 MG capsule TAKE 1 CAPSULE BY MOUTH IN THE MORNING AND 1 TO 3 CAPSULES EVERY NIGHT FOR CERVICAL RADICULOPATHY AND NEUROPATHY   rosuvastatin (CRESTOR) 40 MG tablet Take 1 tablet (40 mg total) by mouth daily.   Tiotropium Bromide Monohydrate (SPIRIVA RESPIMAT) 2.5 MCG/ACT AERS Inhale 2 puffs into the lungs daily.   TRULICITY 3 YW/7.3XT SOPN Inject into the skin.   VENTOLIN HFA 108 (90 Base) MCG/ACT inhaler INHALE 2 PUFFS INTO THE LUNGS EVERY 6 HOURS AS NEEDED FOR WHEEZING OR SHORTNESS OF BREATH   budesonide-formoterol (SYMBICORT) 160-4.5 MCG/ACT inhaler Inhale 2 puffs into the lungs 2 (two) times daily. (Patient not taking: Reported on 10/22/2022)   carbamide peroxide  (DEBROX) 6.5 % OTIC solution Place 5 drops into the right ear 2 (two) times daily. (Patient not taking: Reported on 10/22/2022)   Facility-Administered Medications Prior to Visit  Medication Dose Route Frequency Provider   albuterol (PROVENTIL) (2.5 MG/3ML) 0.083% nebulizer solution 2.5 mg  2.5 mg Nebulization Once Armando Reichert, MD    Review of Systems  Constitutional:  Positive for fatigue. Negative for chills and fever.  HENT:  Positive for congestion, postnasal drip, rhinorrhea, sneezing and sore throat. Negative for ear pain.   Respiratory:  Positive for cough and shortness of breath.   Gastrointestinal:  Negative for diarrhea, nausea and vomiting.  Musculoskeletal:  Negative for myalgias.  Neurological:  Negative for dizziness and headaches.       Objective    BP (!) 198/100   Pulse 80   Temp 98.2 F (36.8 C) (Oral)   Resp 16   Ht '5\' 7"'$  (1.702 m)   Wt 231 lb 14.4 oz (105.2 kg)   SpO2 98%   BMI 36.32 kg/m    Physical Exam Vitals reviewed.  Constitutional:      Appearance: Normal appearance. He is well-developed.  HENT:     Head: Normocephalic and atraumatic.     Right Ear: Hearing, ear canal and external ear normal. No hemotympanum. Tympanic membrane is erythematous. Tympanic membrane is not injected, scarred, perforated, retracted or bulging.     Left Ear: Hearing, ear canal and external ear normal. No hemotympanum. Tympanic membrane is perforated, erythematous and bulging. Tympanic membrane is not injected, scarred or retracted.     Nose: Congestion present. No signs of injury, nasal tenderness or mucosal edema.     Mouth/Throat:     Lips: Pink.     Mouth: Mucous membranes are moist.     Pharynx: Uvula midline. Posterior oropharyngeal erythema present. No pharyngeal swelling or oropharyngeal exudate.     Tonsils: No tonsillar exudate or tonsillar abscesses.  Eyes:     Extraocular Movements: Extraocular movements intact.     Conjunctiva/sclera: Conjunctivae normal.   Cardiovascular:     Rate and Rhythm: Normal rate and regular rhythm.     Pulses: Normal pulses.          Radial pulses are 2+ on the right side and 2+ on the left side.     Heart sounds: Murmur (Chart review show chronic systolic murmur which appears to have been noted in 2022) heard.     Systolic murmur is present with a grade of 2/6.     No friction rub. No gallop.  Pulmonary:     Effort: Pulmonary effort is normal.     Breath sounds: No decreased air movement. Decreased breath sounds present. No wheezing, rhonchi or rales.  Musculoskeletal:     Cervical back: Normal range of motion  and neck supple.     Right lower leg: No edema.     Left lower leg: No edema.  Lymphadenopathy:     Head:     Right side of head: No submental, submandibular or preauricular adenopathy.     Left side of head: No submental, submandibular or preauricular adenopathy.     Cervical: No cervical adenopathy.     Right cervical: No superficial or posterior cervical adenopathy.    Left cervical: No superficial or posterior cervical adenopathy.     Upper Body:     Right upper body: No supraclavicular adenopathy.     Left upper body: No supraclavicular adenopathy.  Skin:    General: Skin is warm and dry.  Neurological:     General: No focal deficit present.     Mental Status: He is alert and oriented to person, place, and time. Mental status is at baseline.  Psychiatric:        Mood and Affect: Mood normal.        Behavior: Behavior normal.        Thought Content: Thought content normal.        Judgment: Judgment normal.       Results for orders placed or performed in visit on 11/13/22  POCT rapid strep A  Result Value Ref Range   Rapid Strep A Screen Positive (A) Negative  POCT Influenza A/B  Result Value Ref Range   Influenza A, POC Negative Negative   Influenza B, POC Negative Negative    Assessment & Plan      No follow-ups on file.      Problem List Items Addressed This Visit    None Visit Diagnoses     Strep pharyngitis    -  Primary Acute, new concern Patient reports sore throat, productive coughing, SOB, nasal congestion, rhinorrhea and sneezing for about 2 days that is not improving with OTC/ home measures Rapid strep was positive in office today, negative for flu- results reviewed with patient during apt Still awaiting COVID results- results to dictate further management Will start Amoxicillin 500 mg PO BID x 10 days  Strep education materials provided in printed AVS  Recommend OTC medications- Mucinex, Robitussin and Flonase to help with symptom management Provided script for tessalon pearls to assist further with coughing Recommend he continue with inhalers, nebulizer treatments for SOB and coughing Discussed his previous use of oral steroids and he reports this caused blood glucose issues so will refrain from using these right now Reviewed return precautions Follow up as needed for persistent or progressing symptoms     Relevant Medications   amoxicillin (AMOXIL) 500 MG capsule   Sore throat       Relevant Orders   POCT rapid strep A (Completed)   Cough, unspecified type       Relevant Medications   benzonatate (TESSALON) 200 MG capsule   Other Relevant Orders   POCT Influenza A/B (Completed)   Novel Coronavirus, NAA ([facility])   Nasal congestion       Relevant Orders   POCT Influenza A/B (Completed)   Novel Coronavirus, NAA ([facility])        No follow-ups on file.   I, Constantina Laseter E Zarin Hagmann, PA-C, have reviewed all documentation for this visit. The documentation on 11/13/22 for the exam, diagnosis, procedures, and orders are all accurate and complete.   Talitha Givens, MHS, PA-C [facility]

## 2022-11-13 NOTE — Patient Instructions (Signed)
Continue to use your daily inhalers and use your rescue inhaler as needed for shortness of breath  Your rapid Group A Strep test came back positive / your Strep culture came back positive This indicates an active Strep Pharyngitis or strep throat infection which will need an antibiotic to resolve to prevent further complications I have sent in a prescription for Amoxicillin 500 mg to be taken by mouth twice per day for 10 days / FINISH THE ENTIRE COURSE unless you are instructed to stop or develop an allergic reaction  Stay well hydrated, I usually recommend consuming about 75 oz or more of water and hydrating beverages per day while recovering from such an infection.   You can use over the counter Ibuprofen and Tylenol (alternating every 4 hours) as needed to assist with fever and pain/discomfort  I recommend discarding and replacing anything that you have used in your mouth in the last 72 hours prior to your symptoms - this includes your toothbrush, straws, mouth guards, etc. Unless you have a way of sanitizing them to reduce risk of reinfection   Do not share drinks or food with anyone until your antibiotic is complete  If you have further concerns or your symptoms seem like they are getting worse, please let us know   You can take Mucinex to help with your congestion - be sure to drink plenty of water while taking the mucinex  I have also send in a script for Tessalon pearls to assist with your cough. You can take over the counter Robitussin as well to help with the coughing too.

## 2022-11-14 LAB — NOVEL CORONAVIRUS, NAA: SARS-CoV-2, NAA: NOT DETECTED

## 2022-11-15 NOTE — Progress Notes (Signed)
Negative for COVID

## 2022-11-21 NOTE — Progress Notes (Unsigned)
Patient ID: Cory Rivas, male    DOB: 06/01/1966, 57 y.o.   MRN: [id number]  HPI  Cory Rivas is a 57 y/o male with a history of DM, hyperlipidemia, HTN, gout, tobacco use and chronic heart failure.   Echo report from 08/23/22 showed an EF of 60-65% along with mild LVH & mild Cory.   Admitted 08/23/22 due to dyspnea, productive cough of brown sputum, mild sore throat, fatigue, dizziness and chest soreness. Steroid burst given. Initially given IV lasix with transition to oral diuretics. Lisinopril held due to AKI. Discharged after 2 days. Was in the ED 08/15/22 due to acute pulmonary edema where he was treated and released.    He presents today for a follow-up visit with a chief complaint of moderate SOB with minimal exertion. Describes this as chronic in nature although does feel like it has worsened over the last couple of days. Has associated fatigue, chest tightness, light-headedness and chronic difficulty sleeping along with this. Denies any abdominal distention, palpitations, pedal edema, chest pain, wheezing, cough or weight gain.   Gets his meds bubble-packed and he says that he was supposed to pick them up 2 days ago but never made time. He says that he picked them today but hasn't taken them yet and the last he took them was 2 days ago. Says that he's going to go home and resume taking them.   Drinks 4-5 bottles of water daily along with 1 soda.   Past Medical History:  Diagnosis Date   Bulging of cervical intervertebral disc    CHF (congestive heart failure) (HCC)    Diabetes mellitus without complication (HCC)    Gout    High cholesterol    Hypertension    Stress due to family tension 04/15/2019   Past Surgical History:  Procedure Laterality Date   CHOLECYSTECTOMY     COLONOSCOPY WITH PROPOFOL N/A 03/10/2020   Procedure: COLONOSCOPY WITH PROPOFOL;  Surgeon: Lucilla Lame, MD;  Location: [facility] ENDOSCOPY;  Service: Endoscopy;  Laterality: N/A;   Family History  Problem Relation Age  of Onset   Heart disease Mother    Diabetes Mother    Hyperlipidemia Mother    Hypertension Mother    Heart attack Mother    Lung cancer Father    Hypertension Father    Stroke Father    AAA (abdominal aortic aneurysm) Maternal Grandmother    Heart attack Maternal Grandmother    Diabetes Maternal Grandfather    Social History   Tobacco Use   Smoking status: Former    Packs/day: 1.00    Years: 37.00    Total pack years: 37.00    Types: Cigarettes    Start date: 10/19/1989    Quit date: 08/20/2022    Years since quitting: 0.2   Smokeless tobacco: Never   Tobacco comments:    30+ years hx smoking 2 ppd, stopped smoking briefly in April 2022 for a surgery and then restarted smoking about 1/2 ppd last 4-5 months     Quit smoking 08/20/2022  Substance Use Topics   Alcohol use: No   Allergies  Allergen Reactions   Oxycodone Nausea And Vomiting   Onion Nausea And Vomiting   Prior to Admission medications   Medication Sig Start Date End Date Taking? Authorizing Provider  allopurinol (ZYLOPRIM) 100 MG tablet TAKE 1 TABLET BY MOUTH ONCE A DAY. TAKE WITH 300 MG TABLETS 08/30/22  Yes Delsa Grana, PA-C  allopurinol (ZYLOPRIM) 300 MG tablet Take one 300 mg  tab po daily WITH 100 mg tab for total of 400 mg once daily for gout 08/30/22  Yes Delsa Grana, PA-C  amoxicillin (AMOXIL) 500 MG capsule Take 1 capsule (500 mg total) by mouth 2 (two) times daily for 10 days. 11/13/22 11/23/22 Yes Mecum, Erin E, PA-C  benzonatate (TESSALON) 200 MG capsule Take 1 capsule (200 mg total) by mouth 2 (two) times daily as needed for cough. 11/13/22  Yes Mecum, Erin E, PA-C  budesonide-formoterol (SYMBICORT) 160-4.5 MCG/ACT inhaler Inhale 2 puffs into the lungs 2 (two) times daily. 08/01/22  Yes Dgayli, Berdine Addison, MD  DULoxetine (CYMBALTA) 30 MG capsule TAKE 1 CAPSULE BY MOUTH 2 TIMES DAILY 10/01/22  Yes Delsa Grana, PA-C  empagliflozin (JARDIANCE) 10 MG TABS tablet Take 1 tablet (10 mg total) by mouth daily before  breakfast. Bringing voucher 10/22/22  Yes Darylene Price A, FNP  ezetimibe (ZETIA) 10 MG tablet Take 1 tablet (10 mg total) by mouth daily. 03/06/22  Yes Delsa Grana, PA-C  fluticasone (FLONASE) 50 MCG/ACT nasal spray Place 1 spray into both nostrils daily. 08/01/22 08/01/23 Yes Dgayli, Berdine Addison, MD  indomethacin (INDOCIN) 25 MG capsule TAKE 2 TABLETS (50 MG) BY 3 TIMES DAILY FOR 2 DAYS AT ONSET OF GOUT FLARE UP, THEN TAKE 1('25MG'$ )TABLET BY MOUTH FOR 3 DAYS 09/05/21  Yes Delsa Grana, PA-C  insulin glargine (LANTUS SOLOSTAR) 100 UNIT/ML Solostar Pen Inject 25 Units into the skin at bedtime. 11/01/20  Yes [provider]  insulin lispro (HUMALOG) 100 UNIT/ML KwikPen Inject 1-45 Units into the skin 3 (three) times daily. Sliding scale up to 45 units per injection 11/08/20  Yes [provider]  ipratropium-albuterol (DUONEB) 0.5-2.5 (3) MG/3ML SOLN Take 3 mLs by nebulization every 6 (six) hours as needed (SOB wheeze cough). 07/23/22  Yes Delsa Grana, PA-C  levocetirizine (XYZAL) 5 MG tablet Take 1 tablet (5 mg total) by mouth daily. 08/01/22 08/01/23 Yes Dgayli, Berdine Addison, MD  metFORMIN (GLUCOPHAGE) 1000 MG tablet TAKE 1 TABLET BY MOUTH TWICE A DAY WITH A MEAL 03/06/22  Yes Delsa Grana, PA-C  nicotine polacrilex (NICOTINE MINI) 2 MG lozenge Take 1 lozenge (2 mg total) by mouth every 2 (two) hours as needed for smoking cessation. 09/24/22 12/23/22 Yes Dgayli, Berdine Addison, MD  Olopatadine HCl 0.2 % SOLN Apply 1 drop to eye 2 (two) times daily as needed (eye allergies). 01/29/22  Yes Delsa Grana, PA-C  potassium chloride SA (KLOR-CON M) 20 MEQ tablet Take 1 tablet (20 mEq total) by mouth daily. Take with furosemide 08/28/22  Yes Delsa Grana, PA-C  predniSONE (DELTASONE) 20 MG tablet Take 2 tablets (40 mg total) by mouth daily with breakfast. 08/26/22  Yes Wouk, Ailene Rud, MD  pregabalin (LYRICA) 25 MG capsule TAKE 1 CAPSULE BY MOUTH IN THE MORNING AND 1 TO 3 CAPSULES EVERY NIGHT FOR CERVICAL  RADICULOPATHY AND NEUROPATHY 07/10/22  Yes Delsa Grana, PA-C  rosuvastatin (CRESTOR) 40 MG tablet Take 1 tablet (40 mg total) by mouth daily. 01/26/22  Yes Delsa Grana, PA-C  Tiotropium Bromide Monohydrate (SPIRIVA RESPIMAT) 2.5 MCG/ACT AERS Inhale 2 puffs into the lungs daily. 09/24/22  Yes Dgayli, Berdine Addison, MD  TRULICITY 3 KG/2.5KY SOPN Inject into the skin. 03/26/22  Yes [provider]  VENTOLIN HFA 108 (90 Base) MCG/ACT inhaler INHALE 2 PUFFS INTO THE LUNGS EVERY 6 HOURS AS NEEDED FOR WHEEZING OR SHORTNESS OF BREATH 04/05/22  Yes Delsa Grana, PA-C  amLODipine (NORVASC) 10 MG tablet Take 1 tablet (10 mg total) by mouth daily. Patient not taking:  Reported on 11/22/2022 01/26/22   Delsa Grana, PA-C  furosemide (LASIX) 20 MG tablet TAKE 1 TABLET BY MOUTH DAILY. TAKE WITH POTASSIUM. Patient not taking: Reported on 11/22/2022 10/24/22   Bo Merino, FNP  hydrALAZINE (APRESOLINE) 100 MG tablet Take 1 tablet (100 mg total) by mouth 3 (three) times daily. Monitor BP Patient not taking: Reported on 11/22/2022 08/07/22   Kate Sable, MD  lisinopril (ZESTRIL) 40 MG tablet Take 40 mg by mouth daily. Patient not taking: Reported on 11/22/2022    [provider]   Review of Systems  Constitutional:  Positive for fatigue (minimal). Negative for appetite change.  HENT:  Negative for congestion, postnasal drip and sore throat.   Eyes: Negative.   Respiratory:  Positive for chest tightness (with SOB) and shortness of breath (worsening). Negative for cough and wheezing.   Cardiovascular:  Negative for chest pain, palpitations and leg swelling.  Gastrointestinal:  Negative for abdominal distention and abdominal pain.  Endocrine: Negative.   Genitourinary: Negative.   Musculoskeletal:  Positive for neck pain. Negative for back pain.  Skin: Negative.   Allergic/Immunologic: Negative.   Neurological:  Positive for light-headedness. Negative for dizziness.  Hematological:  Negative for  adenopathy. Does not bruise/bleed easily.  Psychiatric/Behavioral:  Positive for sleep disturbance (sleeping on 3 pillows (normal amount)). Negative for dysphoric mood. The patient is not nervous/anxious.    Vitals:   11/22/22 1031  BP: (!) 160/93  Pulse: 80  Resp: 20  SpO2: 97%  Weight: 231 lb 4 oz (104.9 kg)   Wt Readings from Last 3 Encounters:  11/22/22 231 lb 4 oz (104.9 kg)  11/13/22 231 lb 14.4 oz (105.2 kg)  10/22/22 232 lb (105.2 kg)   Lab Results  Component Value Date   CREATININE 1.21 11/22/2022   CREATININE 1.43 (H) 09/10/2022   CREATININE 1.32 (H) 08/30/2022   Physical Exam Vitals and nursing note reviewed.  Constitutional:      Appearance: He is well-developed.  HENT:     Head: Normocephalic and atraumatic.  Neck:     Vascular: No JVD.  Cardiovascular:     Rate and Rhythm: Normal rate and regular rhythm.  Pulmonary:     Effort: Pulmonary effort is normal.     Breath sounds: No wheezing, rhonchi or rales.  Abdominal:     Palpations: Abdomen is soft.     Tenderness: There is no abdominal tenderness.  Musculoskeletal:     Cervical back: Normal range of motion and neck supple.     Right lower leg: No tenderness. Edema (1+ pitting) present.     Left lower leg: No tenderness. Edema (1+ pitting) present.  Skin:    General: Skin is warm and dry.  Neurological:     General: No focal deficit present.     Mental Status: He is alert and oriented to person, place, and time.  Psychiatric:        Mood and Affect: Mood normal.        Behavior: Behavior normal.    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- - NYHA class III - fluid overloaded with worsening symptoms and elevated ReDs clip reading - weighing daily; reminded to call for an overnight weight gain of >2 pounds or a weekly weight gain of > 5 pounds - weight stable from last visit here 1 month ago - on GDMT of lisinopril & jardiance - BMP today as jardiance  started at last visit -  ReDs clip reading was elevated at 43% - emphasized going home and resuming his medications - he uses pill packs and can't say with certainty what he's taking; he also says that he just picked up his pill packs this morning so has been out of his medications for the last 2 days - emphasized the importance of making sure he doesn't run out of his medications and discussed the worsening of his symptoms over these last 2 days that he's been out of meds; he says that he's going to take them once he gets home - saw cardiology ([facility]) 09/21/22 - drinking 4-5 water bottles and 1 soda daily - BNP 08/23/22 was 164.0 - has received his flu vaccine for this season - PharmD reconciled meds w/ patient  2: HTN- - BP 160/93; resuming his meds per above - saw PCP (Mecum) 11/13/22 (tested + strep) & amox '500mg'$  BID started - BMP 09/10/22 reviewed and showed sodium 139, potassium 4.4, creatinine 1.43 and GFR 58  3: DM- - A1c 08/23/22 was 7.5% - currently on trulicity, metformin and insulin - had telemedicine visit with endocrinology 10/18/22 - home glucose this morning was 120  4: Reactive airway disease- - saw pulmonology (Dgayli) 09/24/22 - using spiriva & xyzal - hasn't smoked in ~ 2 months   Medication list reviewed but he's uncertain about his meds because they are pill packed. Emphasized bringing them to every visit. Will call his pharmacy and ask for med list  Return in 1 week, sooner if needed.

## 2022-11-22 ENCOUNTER — Other Ambulatory Visit
Admission: RE | Admit: 2022-11-22 | Discharge: 2022-11-22 | Disposition: A | Discharge status: Home / Self Care | Payer: Medicaid Other | Source: Ambulatory Visit | Attending: Family | Admitting: Family

## 2022-11-22 ENCOUNTER — Encounter: Payer: Self-pay | Admitting: Family

## 2022-11-22 ENCOUNTER — Other Ambulatory Visit: Payer: Self-pay | Admitting: Family

## 2022-11-22 ENCOUNTER — Telehealth: Payer: Self-pay

## 2022-11-22 ENCOUNTER — Emergency Department: Payer: Medicaid Other

## 2022-11-22 ENCOUNTER — Ambulatory Visit (HOSPITAL_BASED_OUTPATIENT_CLINIC_OR_DEPARTMENT_OTHER): Payer: Medicaid Other | Admitting: Family

## 2022-11-22 ENCOUNTER — Ambulatory Visit: Payer: Medicaid Other | Admitting: Cardiology

## 2022-11-22 ENCOUNTER — Other Ambulatory Visit: Payer: Self-pay

## 2022-11-22 ENCOUNTER — Inpatient Hospital Stay
Admission: EM | Admit: 2022-11-22 | Discharge: 2022-11-24 | DRG: 304 | Disposition: A | Discharge status: Home / Self Care | Payer: Medicaid Other | Attending: Internal Medicine | Admitting: Internal Medicine

## 2022-11-22 VITALS — BP 160/93 | HR 80 | Resp 20 | Wt 231.2 lb

## 2022-11-22 DIAGNOSIS — Z79899 Other long term (current) drug therapy: Secondary | ICD-10-CM | POA: Diagnosis not present

## 2022-11-22 DIAGNOSIS — Z1152 Encounter for screening for COVID-19: Secondary | ICD-10-CM | POA: Diagnosis not present

## 2022-11-22 DIAGNOSIS — Z7951 Long term (current) use of inhaled steroids: Secondary | ICD-10-CM | POA: Diagnosis not present

## 2022-11-22 DIAGNOSIS — I161 Hypertensive emergency: Principal | ICD-10-CM | POA: Diagnosis present

## 2022-11-22 DIAGNOSIS — I5030 Unspecified diastolic (congestive) heart failure: Secondary | ICD-10-CM

## 2022-11-22 DIAGNOSIS — R0602 Shortness of breath: Secondary | ICD-10-CM | POA: Diagnosis not present

## 2022-11-22 DIAGNOSIS — I10 Essential (primary) hypertension: Secondary | ICD-10-CM

## 2022-11-22 DIAGNOSIS — Z6839 Body mass index (BMI) 39.0-39.9, adult: Secondary | ICD-10-CM | POA: Diagnosis not present

## 2022-11-22 DIAGNOSIS — R0902 Hypoxemia: Secondary | ICD-10-CM

## 2022-11-22 DIAGNOSIS — Z794 Long term (current) use of insulin: Secondary | ICD-10-CM | POA: Diagnosis not present

## 2022-11-22 DIAGNOSIS — N179 Acute kidney failure, unspecified: Secondary | ICD-10-CM | POA: Diagnosis present

## 2022-11-22 DIAGNOSIS — M1A09X0 Idiopathic chronic gout, multiple sites, without tophus (tophi): Secondary | ICD-10-CM | POA: Diagnosis present

## 2022-11-22 DIAGNOSIS — M5030 Other cervical disc degeneration, unspecified cervical region: Secondary | ICD-10-CM | POA: Diagnosis not present

## 2022-11-22 DIAGNOSIS — Z7984 Long term (current) use of oral hypoglycemic drugs: Secondary | ICD-10-CM

## 2022-11-22 DIAGNOSIS — I509 Heart failure, unspecified: Secondary | ICD-10-CM | POA: Diagnosis not present

## 2022-11-22 DIAGNOSIS — Z91018 Allergy to other foods: Secondary | ICD-10-CM

## 2022-11-22 DIAGNOSIS — Z8249 Family history of ischemic heart disease and other diseases of the circulatory system: Secondary | ICD-10-CM | POA: Diagnosis not present

## 2022-11-22 DIAGNOSIS — J4489 Other specified chronic obstructive pulmonary disease: Secondary | ICD-10-CM | POA: Diagnosis present

## 2022-11-22 DIAGNOSIS — Z885 Allergy status to narcotic agent status: Secondary | ICD-10-CM | POA: Diagnosis not present

## 2022-11-22 DIAGNOSIS — J45909 Unspecified asthma, uncomplicated: Secondary | ICD-10-CM | POA: Diagnosis not present

## 2022-11-22 DIAGNOSIS — I5033 Acute on chronic diastolic (congestive) heart failure: Secondary | ICD-10-CM | POA: Diagnosis not present

## 2022-11-22 DIAGNOSIS — Z9049 Acquired absence of other specified parts of digestive tract: Secondary | ICD-10-CM

## 2022-11-22 DIAGNOSIS — J9601 Acute respiratory failure with hypoxia: Secondary | ICD-10-CM | POA: Diagnosis present

## 2022-11-22 DIAGNOSIS — M109 Gout, unspecified: Secondary | ICD-10-CM | POA: Insufficient documentation

## 2022-11-22 DIAGNOSIS — G8929 Other chronic pain: Secondary | ICD-10-CM | POA: Diagnosis present

## 2022-11-22 DIAGNOSIS — R0789 Other chest pain: Secondary | ICD-10-CM | POA: Diagnosis not present

## 2022-11-22 DIAGNOSIS — Z833 Family history of diabetes mellitus: Secondary | ICD-10-CM | POA: Diagnosis not present

## 2022-11-22 DIAGNOSIS — R7989 Other specified abnormal findings of blood chemistry: Secondary | ICD-10-CM

## 2022-11-22 DIAGNOSIS — E7800 Pure hypercholesterolemia, unspecified: Secondary | ICD-10-CM | POA: Diagnosis not present

## 2022-11-22 DIAGNOSIS — Z87891 Personal history of nicotine dependence: Secondary | ICD-10-CM

## 2022-11-22 DIAGNOSIS — I110 Hypertensive heart disease with heart failure: Secondary | ICD-10-CM | POA: Diagnosis not present

## 2022-11-22 DIAGNOSIS — E119 Type 2 diabetes mellitus without complications: Secondary | ICD-10-CM

## 2022-11-22 DIAGNOSIS — J810 Acute pulmonary edema: Principal | ICD-10-CM

## 2022-11-22 DIAGNOSIS — I5032 Chronic diastolic (congestive) heart failure: Secondary | ICD-10-CM | POA: Insufficient documentation

## 2022-11-22 DIAGNOSIS — R0600 Dyspnea, unspecified: Secondary | ICD-10-CM

## 2022-11-22 LAB — RESP PANEL BY RT-PCR (RSV, FLU A&B, COVID)  RVPGX2
Influenza A by PCR: NEGATIVE
Influenza B by PCR: NEGATIVE
Resp Syncytial Virus by PCR: NEGATIVE
SARS Coronavirus 2 by RT PCR: NEGATIVE

## 2022-11-22 LAB — CBC
HCT: 45.6 % (ref 39.0–52.0)
Hemoglobin: 15.1 g/dL (ref 13.0–17.0)
MCH: 27.8 pg (ref 26.0–34.0)
MCHC: 33.1 g/dL (ref 30.0–36.0)
MCV: 84 fL (ref 80.0–100.0)
Platelets: 233 10*3/uL (ref 150–400)
RBC: 5.43 MIL/uL (ref 4.22–5.81)
RDW: 14.1 % (ref 11.5–15.5)
WBC: 10.6 10*3/uL — ABNORMAL HIGH (ref 4.0–10.5)
nRBC: 0 % (ref 0.0–0.2)

## 2022-11-22 LAB — BASIC METABOLIC PANEL
Anion gap: 9 (ref 5–15)
Anion gap: 12 (ref 5–15)
BUN: 15 mg/dL (ref 6–20)
BUN: 13 mg/dL (ref 6–20)
CO2: 26 mmol/L (ref 22–32)
CO2: 25 mmol/L (ref 22–32)
Calcium: 8.5 mg/dL — ABNORMAL LOW (ref 8.9–10.3)
Calcium: 8.5 mg/dL — ABNORMAL LOW (ref 8.9–10.3)
Chloride: 104 mmol/L (ref 98–111)
Chloride: 100 mmol/L (ref 98–111)
Creatinine, Ser: 1.21 mg/dL (ref 0.61–1.24)
Creatinine, Ser: 1.21 mg/dL (ref 0.61–1.24)
GFR, Estimated: >60 mL/min (ref >60)
GFR, Estimated: >60 mL/min (ref >60)
Glucose, Bld: 231 mg/dL — ABNORMAL HIGH (ref 70–99)
Glucose, Bld: 124 mg/dL — ABNORMAL HIGH (ref 70–99)
Potassium: 3.6 mmol/L (ref 3.5–5.1)
Potassium: 3.5 mmol/L (ref 3.5–5.1)
Sodium: 139 mmol/L (ref 135–145)
Sodium: 137 mmol/L (ref 135–145)

## 2022-11-22 LAB — CBG MONITORING, ED: Glucose-Capillary: 145 mg/dL — ABNORMAL HIGH (ref 70–99)

## 2022-11-22 LAB — TROPONIN I (HIGH SENSITIVITY): Troponin I (High Sensitivity): 31 ng/L — ABNORMAL HIGH (ref <18)

## 2022-11-22 LAB — BRAIN NATRIURETIC PEPTIDE: B Natriuretic Peptide: 218.3 pg/mL — ABNORMAL HIGH (ref 0.0–100.0)

## 2022-11-22 MED ORDER — ONDANSETRON HCL 4 MG/2ML IJ SOLN
4.0000 mg | Freq: Four times a day (QID) | INTRAMUSCULAR | Status: DC | PRN
  Administered 2022-11-23: 4 mg via INTRAVENOUS
  Filled 2022-11-22: qty 2

## 2022-11-22 MED ORDER — HYDROCODONE-ACETAMINOPHEN 5-325 MG PO TABS
1.0000 | ORAL_TABLET | ORAL | Status: DC | PRN
  Administered 2022-11-22: 2 via ORAL
  Filled 2022-11-22: qty 2

## 2022-11-22 MED ORDER — ENOXAPARIN SODIUM 60 MG/0.6ML IJ SOSY
0.5000 mg/kg | PREFILLED_SYRINGE | INTRAMUSCULAR | Status: DC
  Administered 2022-11-22 – 2022-11-23 (×2): 52.5 mg via SUBCUTANEOUS
  Filled 2022-11-22 (×2): qty 0.6

## 2022-11-22 MED ORDER — EMPAGLIFLOZIN 10 MG PO TABS
10.0000 mg | ORAL_TABLET | Freq: Every day | ORAL | Status: DC
  Administered 2022-11-23 – 2022-11-24 (×2): 10 mg via ORAL
  Filled 2022-11-22 (×2): qty 1

## 2022-11-22 MED ORDER — ONDANSETRON HCL 4 MG PO TABS: 4.0000 mg | ORAL_TABLET | Freq: Four times a day (QID) | ORAL | Status: DC | PRN

## 2022-11-22 MED ORDER — INSULIN GLARGINE-YFGN 100 UNIT/ML ~~LOC~~ SOLN
20.0000 [IU] | Freq: Every day | SUBCUTANEOUS | Status: DC
  Administered 2022-11-22 – 2022-11-23 (×2): 20 [IU] via SUBCUTANEOUS
  Filled 2022-11-22 (×3): qty 0.2

## 2022-11-22 MED ORDER — ALLOPURINOL 100 MG PO TABS
400.0000 mg | ORAL_TABLET | Freq: Every day | ORAL | Status: DC
  Administered 2022-11-23 – 2022-11-24 (×2): 400 mg via ORAL
  Filled 2022-11-22: qty 1
  Filled 2022-11-22: qty 4

## 2022-11-22 MED ORDER — FUROSEMIDE 10 MG/ML IJ SOLN
60.0000 mg | Freq: Once | INTRAMUSCULAR | Status: AC
  Administered 2022-11-22: 60 mg via INTRAVENOUS
  Filled 2022-11-22: qty 8

## 2022-11-22 MED ORDER — FUROSEMIDE 10 MG/ML IJ SOLN
40.0000 mg | Freq: Two times a day (BID) | INTRAMUSCULAR | Status: DC
  Administered 2022-11-23 – 2022-11-24 (×3): 40 mg via INTRAVENOUS
  Filled 2022-11-22 (×3): qty 4

## 2022-11-22 MED ORDER — NITROGLYCERIN 2 % TD OINT
1.0000 [in_us] | TOPICAL_OINTMENT | Freq: Once | TRANSDERMAL | Status: AC
  Administered 2022-11-22: 1 [in_us] via TOPICAL
  Filled 2022-11-22: qty 1

## 2022-11-22 MED ORDER — INSULIN ASPART 100 UNIT/ML IJ SOLN
0.0000 [IU] | Freq: Three times a day (TID) | INTRAMUSCULAR | Status: DC
  Administered 2022-11-23: 3 [IU] via SUBCUTANEOUS
  Administered 2022-11-24: 2 [IU] via SUBCUTANEOUS
  Filled 2022-11-22 (×2): qty 1

## 2022-11-22 MED ORDER — DULOXETINE HCL 30 MG PO CPEP
30.0000 mg | ORAL_CAPSULE | Freq: Two times a day (BID) | ORAL | Status: DC
  Administered 2022-11-22 – 2022-11-24 (×4): 30 mg via ORAL
  Filled 2022-11-22 (×4): qty 1

## 2022-11-22 MED ORDER — ACETAMINOPHEN 325 MG PO TABS: 650.0000 mg | ORAL_TABLET | Freq: Four times a day (QID) | ORAL | Status: DC | PRN

## 2022-11-22 MED ORDER — INSULIN ASPART 100 UNIT/ML IJ SOLN: 0.0000 [IU] | Freq: Every day | INTRAMUSCULAR | Status: DC

## 2022-11-22 MED ORDER — FUROSEMIDE 20 MG PO TABS: ORAL_TABLET | ORAL | 1 refills | Status: DC

## 2022-11-22 MED ORDER — ACETAMINOPHEN 650 MG RE SUPP: 650.0000 mg | Freq: Four times a day (QID) | RECTAL | Status: DC | PRN

## 2022-11-22 MED ORDER — HYDRALAZINE HCL 20 MG/ML IJ SOLN: 5.0000 mg | INTRAMUSCULAR | Status: DC | PRN

## 2022-11-22 MED ORDER — ROSUVASTATIN CALCIUM 10 MG PO TABS
40.0000 mg | ORAL_TABLET | Freq: Every day | ORAL | Status: DC
  Administered 2022-11-23: 40 mg via ORAL
  Filled 2022-11-22: qty 4

## 2022-11-22 MED ORDER — MOMETASONE FURO-FORMOTEROL FUM 200-5 MCG/ACT IN AERO
2.0000 | INHALATION_SPRAY | Freq: Two times a day (BID) | RESPIRATORY_TRACT | Status: DC
  Administered 2022-11-23 – 2022-11-24 (×3): 2 via RESPIRATORY_TRACT
  Filled 2022-11-22: qty 8.8

## 2022-11-22 MED ORDER — ALLOPURINOL 300 MG PO TABS: 300.0000 mg | ORAL_TABLET | Freq: Every day | ORAL | Status: DC

## 2022-11-22 MED ORDER — HYDRALAZINE HCL 20 MG/ML IJ SOLN
10.0000 mg | Freq: Once | INTRAMUSCULAR | Status: AC
  Administered 2022-11-22: 10 mg via INTRAVENOUS
  Filled 2022-11-22: qty 1

## 2022-11-22 NOTE — ED Triage Notes (Signed)
Pt reports over the past couple of days he has had worsening sob, chest discomfort, and dizziness. States he hasn't had his lasix in a couple of days. Pt is AxOx4.

## 2022-11-22 NOTE — Assessment & Plan Note (Signed)
Controlled.  Continue basal insulin, Jardiance with sliding scale coverage

## 2022-11-22 NOTE — Telephone Encounter (Signed)
Received secure chart from Darylene Price, NP and Dr. Garen Lah:  I'm seeing this guy now and I see he's supposed to see you today at 11:40am. Do you still want to see him?  I finished w/ him and sent him to the lab for blood work. He's been out of numerous meds because he hadn't picked up his bubble pack from the pharmacy until this morning. Says that he last took his diuretic, HTN meds was Tuesday. ReDs reading was elevated at 43% so I told him that he needed to go home today and take his meds. I'm seeing him back again next week.   Kate Sable, MD no need for me to see today. thanks for the update. add coreg if BP is elevated at follow up visit.   BA [place] please schedule f/u with me in 3 months.   Alisa Graff, [place], he will probably show up for the appt as he had already gone to the lab. I did tell him that you many r/s it and not see him today. Thanks!

## 2022-11-22 NOTE — Assessment & Plan Note (Addendum)
Suspect demand ischemia.  EKG is nonacute Continue to trend

## 2022-11-22 NOTE — ED Notes (Signed)
Pt called RN to room, complains of not being able to breathe, put pt on [place] at 2L, and pt was breathing about 41x/min. Pt also noted acute pain in his chest where it felt like someone was "poking in on his chest on the left side." Pt stated he "started to feel wheezy and then started to cough and couldn't breathe." After pt was on [place] for a couple of minutes, pt stated that breathing became better and he didn't feel as congested.

## 2022-11-22 NOTE — H&P (Addendum)
History and Physical    Patient: Cory Rivas VOJ:[id number] DOB: 03-28-1966 DOA: 11/22/2022 DOS: the patient was seen and examined on 11/22/2022 PCP: Delsa Grana, PA-C  Patient coming from: Home  Chief Complaint:  Chief Complaint  Patient presents with   Shortness of Breath   Chest Pain    HPI: Cory Rivas is a 57 y.o. male with medical history significant DM, HTN, diastolic CHF, gout, chronic pain who presents to the ED with shortness of breath that has been progressively worsening over the past 24 hours.  Reports running out of his medication 3 days ago.    Has orthopnea.  Denies lower extremity edema. Denies cough, fever or chills. ED course and data review: BP on arrival 208/126 and tachypneic to 27.  Oxygen saturation intermittently down to 91, dropping to the mid to high 80s with exertion and becoming increasingly tachypneic to 35 thus requiring BiPAP for work of breathing.  Labs significant for troponin of 31, BNP 218.  WBC 10,900.  Respiratory viral panel still pending. EKG, personally viewed and interpreted showing NSR at 86 with nonspecific ST-T wave changes.  Chest x-ray showing the following: Cardiomegaly with pulmonary edema and possible small bilateral pleural effusions. Patient treated with IV Lasix, nitroglycerin ointment and hydralazine and hospitalist consulted for admission for CHF exacerbation.   Review of Systems: As mentioned in the history of present illness. All other systems reviewed and are negative.  Past Medical History:  Diagnosis Date   Bulging of cervical intervertebral disc    CHF (congestive heart failure) (HCC)    Diabetes mellitus without complication (HCC)    Gout    High cholesterol    Hypertension    Stress due to family tension 04/15/2019   Past Surgical History:  Procedure Laterality Date   CHOLECYSTECTOMY     COLONOSCOPY WITH PROPOFOL N/A 03/10/2020   Procedure: COLONOSCOPY WITH PROPOFOL;  Surgeon: Lucilla Lame, MD;  Location: [facility]  ENDOSCOPY;  Service: Endoscopy;  Laterality: N/A;   Social History:  reports that he quit smoking about 3 months ago. His smoking use included cigarettes. He started smoking about 33 years ago. He has a 37.00 pack-year smoking history. He has never used smokeless tobacco. He reports that he does not drink alcohol and does not use drugs.  Allergies  Allergen Reactions   Oxycodone Nausea And Vomiting   Onion Nausea And Vomiting    Family History  Problem Relation Age of Onset   Heart disease Mother    Diabetes Mother    Hyperlipidemia Mother    Hypertension Mother    Heart attack Mother    Lung cancer Father    Hypertension Father    Stroke Father    AAA (abdominal aortic aneurysm) Maternal Grandmother    Heart attack Maternal Grandmother    Diabetes Maternal Grandfather     Prior to Admission medications   Medication Sig Start Date End Date Taking? Authorizing Provider  allopurinol (ZYLOPRIM) 100 MG tablet TAKE 1 TABLET BY MOUTH ONCE A DAY. TAKE WITH 300 MG TABLETS 08/30/22   Delsa Grana, PA-C  allopurinol (ZYLOPRIM) 300 MG tablet Take one 300 mg tab po daily WITH 100 mg tab for total of 400 mg once daily for gout 08/30/22   Delsa Grana, PA-C  amoxicillin (AMOXIL) 500 MG capsule Take 1 capsule (500 mg total) by mouth 2 (two) times daily for 10 days. 11/13/22 11/23/22  Mecum, Erin E, PA-C  benzonatate (TESSALON) 200 MG capsule Take 1 capsule (200 mg  total) by mouth 2 (two) times daily as needed for cough. 11/13/22   Mecum, Erin E, PA-C  budesonide-formoterol (SYMBICORT) 160-4.5 MCG/ACT inhaler Inhale 2 puffs into the lungs 2 (two) times daily. 08/01/22   Armando Reichert, MD  DULoxetine (CYMBALTA) 30 MG capsule TAKE 1 CAPSULE BY MOUTH 2 TIMES DAILY 10/01/22   Delsa Grana, PA-C  empagliflozin (JARDIANCE) 10 MG TABS tablet Take 1 tablet (10 mg total) by mouth daily before breakfast. Bringing voucher 10/22/22   Alisa Graff, FNP  fluticasone (FLONASE) 50 MCG/ACT nasal spray Place 1 spray  into both nostrils daily. 08/01/22 08/01/23  Armando Reichert, MD  furosemide (LASIX) 20 MG tablet TAKE 1 TABLET BY MOUTH DAILY. TAKE WITH POTASSIUM. 11/22/22   Darylene Price A, FNP  indomethacin (INDOCIN) 25 MG capsule TAKE 2 TABLETS (50 MG) BY 3 TIMES DAILY FOR 2 DAYS AT ONSET OF GOUT FLARE UP, THEN TAKE 1('25MG'$ )TABLET BY MOUTH FOR 3 DAYS 09/05/21   Delsa Grana, PA-C  insulin glargine (LANTUS SOLOSTAR) 100 UNIT/ML Solostar Pen Inject 25 Units into the skin at bedtime. 11/01/20   [provider]  insulin lispro (HUMALOG) 100 UNIT/ML KwikPen Inject 1-45 Units into the skin 3 (three) times daily. Sliding scale up to 45 units per injection 11/08/20   [provider]  ipratropium-albuterol (DUONEB) 0.5-2.5 (3) MG/3ML SOLN Take 3 mLs by nebulization every 6 (six) hours as needed (SOB wheeze cough). 07/23/22   Delsa Grana, PA-C  levocetirizine (XYZAL) 5 MG tablet Take 1 tablet (5 mg total) by mouth daily. 08/01/22 08/01/23  Armando Reichert, MD  metFORMIN (GLUCOPHAGE) 1000 MG tablet TAKE 1 TABLET BY MOUTH TWICE A DAY WITH A MEAL 03/06/22   Delsa Grana, PA-C  nicotine polacrilex (NICOTINE MINI) 2 MG lozenge Take 1 lozenge (2 mg total) by mouth every 2 (two) hours as needed for smoking cessation. 09/24/22 12/23/22  Armando Reichert, MD  Olopatadine HCl 0.2 % SOLN Apply 1 drop to eye 2 (two) times daily as needed (eye allergies). 01/29/22   Delsa Grana, PA-C  potassium chloride SA (KLOR-CON M) 20 MEQ tablet Take 1 tablet (20 mEq total) by mouth daily. Take with furosemide 08/28/22   Delsa Grana, PA-C  pregabalin (LYRICA) 25 MG capsule TAKE 1 CAPSULE BY MOUTH IN THE MORNING AND 1 TO 3 CAPSULES EVERY NIGHT FOR CERVICAL RADICULOPATHY AND NEUROPATHY 07/10/22   Delsa Grana, PA-C  rosuvastatin (CRESTOR) 40 MG tablet Take 1 tablet (40 mg total) by mouth daily. 01/26/22   Delsa Grana, PA-C  Tiotropium Bromide Monohydrate (SPIRIVA RESPIMAT) 2.5 MCG/ACT AERS Inhale 2 puffs into the lungs daily. 09/24/22   Armando Reichert, MD  TRULICITY 3 WF/0.9NA SOPN Inject into the skin. 03/26/22   [provider]  VENTOLIN HFA 108 (90 Base) MCG/ACT inhaler INHALE 2 PUFFS INTO THE LUNGS EVERY 6 HOURS AS NEEDED FOR WHEEZING OR SHORTNESS OF BREATH 04/05/22   Delsa Grana, PA-C    Physical Exam: Vitals:   11/22/22 1656 11/22/22 1715 11/22/22 1720 11/22/22 1730  BP:  (!) 194/108  (!) 174/95  Pulse:  84  78  Resp:  (!) 25  (!) 35  Temp:      SpO2:  93% 91% 93%  Weight: 104.8 kg     Height: '5\' 7"'$  (1.702 m)      Physical Exam Vitals and nursing note reviewed.  Constitutional:      General: He is not in acute distress.    Comments: On BiPAP.  Increased work of breathing  HENT:  Head: Normocephalic and atraumatic.  Cardiovascular:     Rate and Rhythm: Normal rate and regular rhythm.     Heart sounds: Normal heart sounds.  Pulmonary:     Effort: Tachypnea present.     Breath sounds: Normal breath sounds.  Abdominal:     Palpations: Abdomen is soft.     Tenderness: There is no abdominal tenderness.  Neurological:     Mental Status: Mental status is at baseline.     Labs on Admission: I have personally reviewed following labs and imaging studies  CBC: Recent Labs  Lab 11/22/22 1700  WBC 10.6*  HGB 15.1  HCT 45.6  MCV 84.0  PLT 784   Basic Metabolic Panel: Recent Labs  Lab 11/22/22 1120 11/22/22 1700  NA 139 137  K 3.6 3.5  CL 104 100  CO2 26 25  GLUCOSE 231* 124*  BUN 15 13  CREATININE 1.21 1.21  CALCIUM 8.5* 8.5*   GFR: Estimated Creatinine Clearance: 79.6 mL/min (by C-G formula based on SCr of 1.21 mg/dL). Liver Function Tests: No results for input(s): "AST", "ALT", "ALKPHOS", "BILITOT", "PROT", "ALBUMIN" in the last 168 hours. No results for input(s): "LIPASE", "AMYLASE" in the last 168 hours. No results for input(s): "AMMONIA" in the last 168 hours. Coagulation Profile: No results for input(s): "INR", "PROTIME" in the last 168 hours. Cardiac Enzymes: No results for  input(s): "CKTOTAL", "CKMB", "CKMBINDEX", "TROPONINI" in the last 168 hours. BNP (last 3 results) No results for input(s): "PROBNP" in the last 8760 hours. HbA1C: No results for input(s): "HGBA1C" in the last 72 hours. CBG: No results for input(s): "GLUCAP" in the last 168 hours. Lipid Profile: No results for input(s): "CHOL", "HDL", "LDLCALC", "TRIG", "CHOLHDL", "LDLDIRECT" in the last 72 hours. Thyroid Function Tests: No results for input(s): "TSH", "T4TOTAL", "FREET4", "T3FREE", "THYROIDAB" in the last 72 hours. Anemia Panel: No results for input(s): "VITAMINB12", "FOLATE", "FERRITIN", "TIBC", "IRON", "RETICCTPCT" in the last 72 hours. Urine analysis:    Component Value Date/Time   COLORURINE YELLOW 07/03/2021 [place] 07/03/2021 1405   APPEARANCEUR Clear 04/28/2014 2040   LABSPEC >1.030 (H) 07/03/2021 1405   LABSPEC 1.030 04/28/2014 2040   PHURINE 5.5 07/03/2021 1405   GLUCOSEU 100 (A) 07/03/2021 1405   GLUCOSEU Negative 04/28/2014 2040   HGBUR TRACE (A) 07/03/2021 1405   BILIRUBINUR Negative 06/27/2022 1527   BILIRUBINUR Negative 04/28/2014 2040   KETONESUR NEGATIVE 07/03/2021 1405   PROTEINUR Positive (A) 06/27/2022 1527   PROTEINUR >300 (A) 07/03/2021 1405   UROBILINOGEN 0.2 06/27/2022 1527   NITRITE Negative 06/27/2022 1527   NITRITE NEGATIVE 07/03/2021 1405   LEUKOCYTESUR Negative 06/27/2022 1527   LEUKOCYTESUR NEGATIVE 07/03/2021 1405   LEUKOCYTESUR Negative 04/28/2014 2040    Radiological Exams on Admission: DG Chest 1 View  Result Date: 11/22/2022 CLINICAL DATA:  Shortness of breath. EXAM: CHEST  1 VIEW COMPARISON:  Radiograph August 23, 2022 and chest CT August 24, 2022 FINDINGS: Cardiac enlargement with central vascular prominence. Diffuse bilateral interstitial opacities with Kerley B-lines. Possible layering small bilateral pleural effusions. Cervicothoracic spinal fusion hardware. IMPRESSION: Cardiomegaly with pulmonary edema and possible  small bilateral pleural effusions. Electronically Signed   By: Dahlia Bailiff M.D.   On: 11/22/2022 17:29     Data Reviewed: Relevant notes from primary care and specialist visits, past discharge summaries as available in EHR, including Care Everywhere. Prior diagnostic testing as pertinent to current admission diagnoses Updated medications and problem lists for reconciliation ED course, including vitals, labs,  imaging, treatment and response to treatment Triage notes, nursing and pharmacy notes and ED provider's notes Notable results as noted in HPI   Assessment and Plan: * Hypertensive emergency Acute on chronic diastolic CHF Acute respiratory failure with hypoxia Patient presented with acute work of breathing requiring BiPAP Continue IV Lasix and nitroglycerin ointment Not currently on beta-blocker or ACE/ARB Daily weights with intake and output monitoring Echocardiogram November 2023 showed EF 60 to 65% and grade 2 diastolic dysfunction Continue BiPAP and wean as tolerated  Elevated troponin Suspect demand ischemia.  EKG is nonacute Continue to trend   Class 2 severe obesity with serious comorbidity and body mass index (BMI) of 39.0 to 39.9 in adult [facility]) Complicating factor to overall prognosis and care  Idiopathic chronic gout of multiple sites without tophus Chronic pain Continue allopurinol  Diabetes mellitus ([place]) Controlled.  Continue basal insulin, Jardiance with sliding scale coverage     DVT prophylaxis: Lovenox  Consults: none  Advance Care Planning:   Code Status: Prior   Family Communication: none  Disposition Plan: Back to previous home environment  Severity of Illness: The appropriate patient status for this patient is INPATIENT. Inpatient status is judged to be reasonable and necessary in order to provide the required intensity of service to ensure the patient's safety. The patient's presenting symptoms, physical exam findings, and initial  radiographic and laboratory data in the context of their chronic comorbidities is felt to place them at high risk for further clinical deterioration. Furthermore, it is not anticipated that the patient will be medically stable for discharge from the hospital within 2 midnights of admission.   * I certify that at the point of admission it is my clinical judgment that the patient will require inpatient hospital care spanning beyond 2 midnights from the point of admission due to high intensity of service, high risk for further deterioration and high frequency of surveillance required.*  Author: Athena Masse, MD 11/22/2022 6:25 PM  For on call review [email or web address].

## 2022-11-22 NOTE — ED Notes (Signed)
Called RT to request bipap.

## 2022-11-22 NOTE — Assessment & Plan Note (Addendum)
Acute on chronic diastolic CHF Acute respiratory failure with hypoxia Patient presented with acute work of breathing requiring BiPAP Continue IV Lasix and nitroglycerin ointment Not currently on beta-blocker or ACE/ARB Daily weights with intake and output monitoring Echocardiogram November 2023 showed EF 60 to 65% and grade 2 diastolic dysfunction Continue BiPAP and wean as tolerated

## 2022-11-22 NOTE — Telephone Encounter (Signed)
Called to reschedule appointment Patient sister answered, patient not with her Will have him call back to reschedule

## 2022-11-22 NOTE — Assessment & Plan Note (Addendum)
Chronic pain Continue allopurinol

## 2022-11-22 NOTE — Assessment & Plan Note (Signed)
Complicating factor to overall prognosis and care

## 2022-11-22 NOTE — ED Notes (Signed)
RT at bedside.

## 2022-11-22 NOTE — Progress Notes (Signed)
ReDS Vest / Clip - 11/22/22 1031       ReDS Vest / Clip   Station Marker D    Ruler Value 38    ReDS Actual Value 43

## 2022-11-22 NOTE — Patient Instructions (Addendum)
Continue weighing daily and call for an overnight weight gain of 3 pounds or more or a weekly weight gain of more than 5 pounds.   Please bring your medication bottles and pill packs to your next appointment.

## 2022-11-22 NOTE — ED Notes (Signed)
Spoke with ED provider about pt's respirations. Going to request Bipap to help with his breathing.

## 2022-11-22 NOTE — ED Provider Notes (Signed)
[facility] Provider Note    Event Date/Time   First MD Initiated Contact with Patient 11/22/22 1710     (approximate)  History   Chief Complaint: Shortness of Breath and Chest Pain  HPI  Cory Rivas is a 57 y.o. male  .  With a past medical history of hypertension, hyperlipidemia, CHF, presents to the emergency department for shortness of breath.  According to the patient he ran out of all of his medications 3 days ago.  Since that time he has been feeling progressively worse shortness of breath.  Denies any chest pain denies any fever or cough.  Patient does not wear O2 at baseline currently satting 90 to 91% on room air while lying in bed at rest.  Patient found to be quite hypertensive as well 208/126 on arrival.   Physical Exam   Triage Vital Signs: ED Triage Vitals  Enc Vitals Group     BP 11/22/22 1654 (!) 208/126     Pulse Rate 11/22/22 1654 87     Resp 11/22/22 1654 (!) 27     Temp 11/22/22 1655 98.4 F (36.9 C)     Temp src --      SpO2 11/22/22 1654 95 %     Weight 11/22/22 1656 231 lb (104.8 kg)     Height 11/22/22 1656 '5\' 7"'$  (1.702 m)     Head Circumference --      Peak Flow --      Pain Score 11/22/22 1656 6     Pain Loc --      Pain Edu? --      Excl. in [place]? --     Most recent vital signs: Vitals:   11/22/22 1654 11/22/22 1655  BP: (!) 208/126 (!) 209/109  Pulse: 87   Resp: (!) 27   Temp:  98.4 F (36.9 C)  SpO2: 95%     General: Awake, no distress.  CV:  Good peripheral perfusion.  Regular rate and rhythm  Resp:  Tachypnea around 25 to 30 breaths/min but no obvious wheeze rales or rhonchi. Abd:  No distention.  Soft, nontender.  No rebound or guarding. Other:  Minimal lower extremity edema   ED Results / Procedures / Treatments   EKG  EKG viewed and interpreted by myself shows a normal sinus rhythm 86 bpm with a narrow QRS, normal axis, normal intervals with nonspecific ST changes.  Inferolateral T wave  inversions.  Inversions also seen on prior EKG.  RADIOLOGY  I have reviewed and interpreted chest x-ray images.  Most consistent with pulmonary edema on my evaluation. Radiology has read cardiomegaly with pulmonary edema and small bilateral pleural effusions.   MEDICATIONS ORDERED IN ED: Medications  hydrALAZINE (APRESOLINE) injection 10 mg (has no administration in time range)  nitroGLYCERIN (NITROGLYN) 2 % ointment 1 inch (has no administration in time range)     IMPRESSION / MDM / ASSESSMENT AND PLAN / ED COURSE  I reviewed the triage vital signs and the nursing notes.  Patient's presentation is most consistent with acute presentation with potential threat to life or bodily function.  Patient presents to the emergency department for worsening shortness of breath over the past 3 days after running out of his medications 3 days ago including his blood pressure medications and Lasix.  Patient found to be quite hypertensive 208/126 mildly tachypneic around 25 breaths/min with room air saturation around 90 to 91%.  Highly suspect CHF exacerbation.  Patient states shortness of  breath is much worse if he tries to lay back.  No significant lower extremity edema.  We will check labs including cardiac enzymes and the BNP we will obtain a chest x-ray.  We will treat with 10 mg of IV hydralazine as well as 1 inch of nitroglycerin ointment while awaiting further results.  Given the patient's tachypnea and borderline hypoxia with significant hypertension patient may require admission.  Will reassess once labs and x-ray have been resulted.  Chest x-ray confirms pulmonary edema.  Patient's BNP is elevated to 218 troponin slightly elevated at 31.  Patient CBC shows no significant finding, chemistry is reassuring.  Patient continues to be somewhat tachypneic around 35 breaths/min but no obvious wheeze rales or rhonchi.  Did briefly drop into the upper 80s placed on 2 L nasal cannula.  Given the patient's  tachypnea we will place the patient on BiPAP and dose IV Lasix.  Patient has received hydralazine and then nitroglycerin blood pressure is decreasing currently 170/95.  Will admit to the hospital service for further workup and treatment.  Patient agreeable to plan of care.  CRITICAL CARE Performed by: Harvest Dark   Total critical care time: 30 minutes  Critical care time was exclusive of separately billable procedures and treating other patients.  Critical care was necessary to treat or prevent imminent or life-threatening deterioration.  Critical care was time spent personally by me on the following activities: development of treatment plan with patient and/or surrogate as well as nursing, discussions with consultants, evaluation of patient's response to treatment, examination of patient, obtaining history from patient or surrogate, ordering and performing treatments and interventions, ordering and review of laboratory studies, ordering and review of radiographic studies, pulse oximetry and re-evaluation of patient's condition.   FINAL CLINICAL IMPRESSION(S) / ED DIAGNOSES   CHF exacerbation Dyspnea Hypoxia   Note:  This document was prepared using Dragon voice recognition software and may include unintentional dictation errors.   Harvest Dark, MD 11/22/22 1818

## 2022-11-23 DIAGNOSIS — I161 Hypertensive emergency: Secondary | ICD-10-CM | POA: Diagnosis not present

## 2022-11-23 DIAGNOSIS — I509 Heart failure, unspecified: Secondary | ICD-10-CM

## 2022-11-23 LAB — BASIC METABOLIC PANEL
Anion gap: 8 (ref 5–15)
BUN: 17 mg/dL (ref 6–20)
CO2: 26 mmol/L (ref 22–32)
Calcium: 8.3 mg/dL — ABNORMAL LOW (ref 8.9–10.3)
Chloride: 102 mmol/L (ref 98–111)
Creatinine, Ser: 1.33 mg/dL — ABNORMAL HIGH (ref 0.61–1.24)
GFR, Estimated: >60 mL/min (ref >60)
Glucose, Bld: 124 mg/dL — ABNORMAL HIGH (ref 70–99)
Potassium: 3.7 mmol/L (ref 3.5–5.1)
Sodium: 136 mmol/L (ref 135–145)

## 2022-11-23 LAB — CBG MONITORING, ED: Glucose-Capillary: 123 mg/dL — ABNORMAL HIGH (ref 70–99)

## 2022-11-23 LAB — GLUCOSE, CAPILLARY
Glucose-Capillary: 83 mg/dL (ref 70–99)
Glucose-Capillary: 171 mg/dL — ABNORMAL HIGH (ref 70–99)

## 2022-11-23 LAB — TROPONIN I (HIGH SENSITIVITY): Troponin I (High Sensitivity): 34 ng/L — ABNORMAL HIGH (ref <18)

## 2022-11-23 MED ORDER — IBUPROFEN 400 MG PO TABS: 400.0000 mg | ORAL_TABLET | Freq: Four times a day (QID) | ORAL | Status: DC | PRN

## 2022-11-23 MED ORDER — AMLODIPINE BESYLATE 5 MG PO TABS
5.0000 mg | ORAL_TABLET | Freq: Every day | ORAL | Status: DC
  Administered 2022-11-23 – 2022-11-24 (×2): 5 mg via ORAL
  Filled 2022-11-23 (×2): qty 1

## 2022-11-23 MED ORDER — TIOTROPIUM BROMIDE MONOHYDRATE 18 MCG IN CAPS
18.0000 ug | ORAL_CAPSULE | Freq: Every day | RESPIRATORY_TRACT | Status: DC
  Administered 2022-11-23 – 2022-11-24 (×2): 18 ug via RESPIRATORY_TRACT
  Filled 2022-11-23: qty 5

## 2022-11-23 MED ORDER — NICOTINE POLACRILEX 2 MG MT GUM: 2.0000 mg | CHEWING_GUM | OROMUCOSAL | Status: DC | PRN

## 2022-11-23 MED ORDER — LORATADINE 10 MG PO TABS
10.0000 mg | ORAL_TABLET | Freq: Every day | ORAL | Status: DC
  Administered 2022-11-23 – 2022-11-24 (×2): 10 mg via ORAL
  Filled 2022-11-23 (×2): qty 1

## 2022-11-23 MED ORDER — BENZONATATE 100 MG PO CAPS: 200.0000 mg | ORAL_CAPSULE | Freq: Two times a day (BID) | ORAL | Status: DC | PRN

## 2022-11-23 MED ORDER — PREGABALIN 25 MG PO CAPS
25.0000 mg | ORAL_CAPSULE | Freq: Two times a day (BID) | ORAL | Status: DC
  Administered 2022-11-23 – 2022-11-24 (×3): 25 mg via ORAL
  Filled 2022-11-23 (×3): qty 1

## 2022-11-23 MED ORDER — LISINOPRIL 20 MG PO TABS
20.0000 mg | ORAL_TABLET | Freq: Every day | ORAL | Status: DC
  Administered 2022-11-23 – 2022-11-24 (×2): 20 mg via ORAL
  Filled 2022-11-23 (×2): qty 1

## 2022-11-23 MED ORDER — FLUTICASONE PROPIONATE 50 MCG/ACT NA SUSP
1.0000 | Freq: Every day | NASAL | Status: DC
  Administered 2022-11-23: 1 via NASAL
  Filled 2022-11-23: qty 16

## 2022-11-23 NOTE — Progress Notes (Signed)
PROGRESS NOTE    Cory Rivas  [id number] DOB: [birth year] DOA: 11/22/2022 PCP: Delsa Grana, PA-C    Brief Narrative:  57 y.o. male with medical history significant DM, HTN, diastolic CHF, gout, chronic pain who presents to the ED with shortness of breath that has been progressively worsening over the past 24 hours.  Reports running out of his medication 3 days ago.    Has orthopnea.  Denies lower extremity edema. Denies cough, fever or chills. ED course and data review: BP on arrival 208/126 and tachypneic to 27.  Oxygen saturation intermittently down to 91, dropping to the mid to high 80s with exertion and becoming increasingly tachypneic to 35 thus requiring BiPAP for work of breathing.  Labs significant for troponin of 31, BNP 218.  WBC 10,900.  Respiratory viral panel still pending. EKG, personally viewed and interpreted showing NSR at 86 with nonspecific ST-T wave changes.  Chest x-ray showing the following: Cardiomegaly with pulmonary edema and possible small bilateral pleural effusions.  Assessment & Plan:   Principal Problem:   Hypertensive emergency Active Problems:   Acute on chronic diastolic congestive heart failure (HCC)   Acute respiratory failure with hypoxia (HCC)   Elevated troponin   Diabetes mellitus (HCC)   Idiopathic chronic gout of multiple sites without tophus   Class 2 severe obesity with serious comorbidity and body mass index (BMI) of 39.0 to 39.9 in adult [facility])   Other chronic pain   Acute decompensated heart failure ([place])   Hypertensive emergency Acute on chronic diastolic CHF Acute respiratory failure with hypoxia Patient presented with acute work of breathing requiring BiPAP NIPPV now weaned off Plan: Continue IV Lasix 40 mg twice daily Lisinopril 20 mg (home dose 40 mg Daily weights with intake and output monitoring Echocardiogram November 2023 showed EF 60 to 65% and grade 2 diastolic dysfunction Continue oxygen, wean as tolerated    Elevated troponin Suspect demand ischemia.  EKG is nonacute   Class 2 severe obesity with serious comorbidity and body mass index (BMI) of 39.0 to 39.9 in adult [facility]) Complicating factor to overall prognosis and care   Idiopathic chronic gout of multiple sites without tophus Chronic pain Continue allopurinol   Diabetes mellitus ([place]) Controlled.  Continue basal insulin, Jardiance with sliding scale coverage  Tobacco use Nicotine lozenges  Hyperlipidemia PTA statin  COPD PTA Symbicort    DVT prophylaxis: SQ Lovenox Code Status: Full Family Communication: Left VM for Lesle Chris [phone] on 2/9 Disposition Plan: Status is: Inpatient Remains inpatient appropriate because: Acute decompensated heart failure on IV diuretic   Level of care: Telemetry Cardiac  Consultants:  None  Procedures:  None  Antimicrobials: None   Subjective: Seen and examined.  Resting comfortably in bed.  Reports improvement in shortness of breath.  Objective: Vitals:   11/23/22 0800 11/23/22 0841 11/23/22 0956 11/23/22 1200  BP: (!) 136/107 (!) 167/88  (!) 158/76  Pulse: 84 72 72 70  Resp:   18 (!) 21  Temp:      TempSrc:      SpO2: 99% 99% 98% 95%  Weight:      Height:        Intake/Output Summary (Last 24 hours) at 11/23/2022 1413 Last data filed at 11/23/2022 1100 Gross per 24 hour  Intake 16 ml  Output 2000 ml  Net -1984 ml   Filed Weights   11/22/22 1656  Weight: 104.8 kg    Examination:  General exam: No acute distress Respiratory system: Bibasilar  crackles.  Normal work of breathing.  3 L Cardiovascular system: S1-S2, RRR, no murmurs, no pedal edema Gastrointestinal system: Obese soft, NT/ND, normal bowel sounds Central nervous system: Alert and oriented. No focal neurological deficits. Extremities: Symmetric 5 x 5 power. Skin: No rashes, lesions or ulcers Psychiatry: Judgement and insight appear normal. Mood & affect appropriate.     Data Reviewed: I  have personally reviewed following labs and imaging studies  CBC: Recent Labs  Lab 11/22/22 1700  WBC 10.6*  HGB 15.1  HCT 45.6  MCV 84.0  PLT 0000000   Basic Metabolic Panel: Recent Labs  Lab 11/22/22 1120 11/22/22 1700 11/23/22 0527  NA 139 137 136  K 3.6 3.5 3.7  CL 104 100 102  CO2 26 25 26  $ GLUCOSE 231* 124* 124*  BUN 15 13 17  $ CREATININE 1.21 1.21 1.33*  CALCIUM 8.5* 8.5* 8.3*   GFR: Estimated Creatinine Clearance: 72.4 mL/min (A) (by C-G formula based on SCr of 1.33 mg/dL (H)). Liver Function Tests: No results for input(s): "AST", "ALT", "ALKPHOS", "BILITOT", "PROT", "ALBUMIN" in the last 168 hours. No results for input(s): "LIPASE", "AMYLASE" in the last 168 hours. No results for input(s): "AMMONIA" in the last 168 hours. Coagulation Profile: No results for input(s): "INR", "PROTIME" in the last 168 hours. Cardiac Enzymes: No results for input(s): "CKTOTAL", "CKMB", "CKMBINDEX", "TROPONINI" in the last 168 hours. BNP (last 3 results) No results for input(s): "PROBNP" in the last 8760 hours. HbA1C: No results for input(s): "HGBA1C" in the last 72 hours. CBG: Recent Labs  Lab 11/22/22 2141 11/23/22 0824  GLUCAP 145* 123*   Lipid Profile: No results for input(s): "CHOL", "HDL", "LDLCALC", "TRIG", "CHOLHDL", "LDLDIRECT" in the last 72 hours. Thyroid Function Tests: No results for input(s): "TSH", "T4TOTAL", "FREET4", "T3FREE", "THYROIDAB" in the last 72 hours. Anemia Panel: No results for input(s): "VITAMINB12", "FOLATE", "FERRITIN", "TIBC", "IRON", "RETICCTPCT" in the last 72 hours. Sepsis Labs: No results for input(s): "PROCALCITON", "LATICACIDVEN" in the last 168 hours.  Recent Results (from the past 240 hour(s))  Resp panel by RT-PCR (RSV, Flu A&B, Covid) Anterior Nasal Swab     Status: None   Collection Time: 11/22/22  5:35 PM   Specimen: Anterior Nasal Swab  Result Value Ref Range Status   SARS Coronavirus 2 by RT PCR NEGATIVE NEGATIVE Final     Comment: (NOTE) SARS-CoV-2 target nucleic acids are NOT DETECTED.  The SARS-CoV-2 RNA is generally detectable in upper respiratory specimens during the acute phase of infection. The lowest concentration of SARS-CoV-2 viral copies this assay can detect is 138 copies/mL. A negative result does not preclude SARS-Cov-2 infection and should not be used as the sole basis for treatment or other patient management decisions. A negative result may occur with  improper specimen collection/handling, submission of specimen other than nasopharyngeal swab, presence of viral mutation(s) within the areas targeted by this assay, and inadequate number of viral copies(<138 copies/mL). A negative result must be combined with clinical observations, patient history, and epidemiological information. The expected result is Negative.  Fact Sheet for Patients:  EntrepreneurPulse.com.au  Fact Sheet for Healthcare Providers:  IncredibleEmployment.be  This test is no t yet approved or cleared by the [place] FDA and  has been authorized for detection and/or diagnosis of SARS-CoV-2 by FDA under an Emergency Use Authorization (EUA). This EUA will remain  in effect (meaning this test can be used) for the duration of the COVID-19 declaration under Section 564(b)(1) of the Act, 21 U.S.C.section 360bbb-3(b)(1), unless  the authorization is terminated  or revoked sooner.       Influenza A by PCR NEGATIVE NEGATIVE Final   Influenza B by PCR NEGATIVE NEGATIVE Final    Comment: (NOTE) The Xpert Xpress SARS-CoV-2/FLU/RSV plus assay is intended as an aid in the diagnosis of influenza from Nasopharyngeal swab specimens and should not be used as a sole basis for treatment. Nasal washings and aspirates are unacceptable for Xpert Xpress SARS-CoV-2/FLU/RSV testing.  Fact Sheet for Patients: EntrepreneurPulse.com.au  Fact Sheet for Healthcare  Providers: IncredibleEmployment.be  This test is not yet approved or cleared by the [place] FDA and has been authorized for detection and/or diagnosis of SARS-CoV-2 by FDA under an Emergency Use Authorization (EUA). This EUA will remain in effect (meaning this test can be used) for the duration of the COVID-19 declaration under Section 564(b)(1) of the Act, 21 U.S.C. section 360bbb-3(b)(1), unless the authorization is terminated or revoked.     Resp Syncytial Virus by PCR NEGATIVE NEGATIVE Final    Comment: (NOTE) Fact Sheet for Patients: EntrepreneurPulse.com.au  Fact Sheet for Healthcare Providers: IncredibleEmployment.be  This test is not yet approved or cleared by the [place] FDA and has been authorized for detection and/or diagnosis of SARS-CoV-2 by FDA under an Emergency Use Authorization (EUA). This EUA will remain in effect (meaning this test can be used) for the duration of the COVID-19 declaration under Section 564(b)(1) of the Act, 21 U.S.C. section 360bbb-3(b)(1), unless the authorization is terminated or revoked.  Performed at [facility], [address], [place], [place] [address]          Radiology Studies: DG Chest 1 View  Result Date: 11/22/2022 CLINICAL DATA:  Shortness of breath. EXAM: CHEST  1 VIEW COMPARISON:  Radiograph August 23, 2022 and chest CT August 24, 2022 FINDINGS: Cardiac enlargement with central vascular prominence. Diffuse bilateral interstitial opacities with Kerley B-lines. Possible layering small bilateral pleural effusions. Cervicothoracic spinal fusion hardware. IMPRESSION: Cardiomegaly with pulmonary edema and possible small bilateral pleural effusions. Electronically Signed   By: Dahlia Bailiff M.D.   On: 11/22/2022 17:29        Scheduled Meds:  allopurinol  400 mg Oral Daily   DULoxetine  30 mg Oral BID   empagliflozin  10 mg Oral QAC breakfast    enoxaparin (LOVENOX) injection  0.5 mg/kg Subcutaneous Q24H   fluticasone  1 spray Each Nare Daily   furosemide  40 mg Intravenous Q12H   insulin aspart  0-15 Units Subcutaneous TID WC   insulin aspart  0-5 Units Subcutaneous QHS   insulin glargine-yfgn  20 Units Subcutaneous QHS   loratadine  10 mg Oral Daily   mometasone-formoterol  2 puff Inhalation BID   pregabalin  25 mg Oral BID   rosuvastatin  40 mg Oral Daily   tiotropium  18 mcg Inhalation Daily   Continuous Infusions:   LOS: 1 day     Sidney Ace, MD [facility]   If 7PM-7AM, please contact night-coverage  11/23/2022, 2:13 PM

## 2022-11-23 NOTE — ED Notes (Signed)
Trial of pt being on room air instead of nasal canula is being attempted at this time. Pt is maintaining at 95% on RA with no s/s of distress at this time.

## 2022-11-23 NOTE — ED Notes (Addendum)
Pt ambulated back to bed after having BM, pt began vomiting in room.  Pt assisted back to bed, pt on 4L [place]

## 2022-11-23 NOTE — Consult Note (Signed)
   Heart Failure Nurse Navigator Note  HFpEF 60 to 65%.  Mild LVH.  Grade 2 diastolic dysfunction.  Mild mitral regurgitation.  He presented to the emergency room with complaints of shortness of breath, chest pain and orthopnea.  Blood pressure was elevated at 208/116.  Had gone 3 days without his medications due to running out of his prescription.  BNP was 218. chest x-ray revealed cardiomegaly with pulmonary edema and small bilateral pleural effusions.  Comorbidities:  Diabetes Hypertension Gout Chronic pain  Medications:  Jardiance 10 mg daily Lasix 40 mg IV every 12 hours  Labs:  Sodium 136, potassium 3.7, chloride 102, CO2 26, BUN 17, creatinine 1.33 BMI 36 Blood pressure 158/76  Initial meeting with patient in the emergency room.  His mother was present at the time.  Discussed what had brought him into the hospital.  He states that he had felt fine after being seen in the outpatient heart failure clinic and went home and proceeded to get on his tractor was working in the yard when all of a sudden at 4 PM he felt like he could not get his breath.  He is still living with his sister and her husband.  He states that he had been without his medications over the weekend but had started taking them again on Tuesday.   Discussed his fluid restriction along with the sodium restriction.  Since he has had strep that he has been more thirsty.  He did not notice a weight gain.  He does not use salt at the table and felt that he was eating a healthier diet.  He has no further questions.  Has follow-up in the outpatient heart failure clinic on February 13 at 330.  He has a 3% no-show ratio which is 4 out of 118 appointments.  Pricilla Riffle RN [facility]

## 2022-11-23 NOTE — ED Notes (Signed)
Pt was placed on 2L as a trial run. Pt tolerated it well.

## 2022-11-23 NOTE — ED Notes (Signed)
[name] [phone]

## 2022-11-23 NOTE — ED Notes (Signed)
Informed RN bed assigned 

## 2022-11-23 NOTE — Discharge Instructions (Signed)

## 2022-11-24 DIAGNOSIS — I161 Hypertensive emergency: Secondary | ICD-10-CM | POA: Diagnosis not present

## 2022-11-24 LAB — GLUCOSE, CAPILLARY: Glucose-Capillary: 139 mg/dL — ABNORMAL HIGH (ref 70–99)

## 2022-11-24 MED ORDER — FUROSEMIDE 40 MG PO TABS: 40.0000 mg | ORAL_TABLET | Freq: Every day | ORAL | 0 refills | Status: DC

## 2022-11-24 NOTE — TOC Transition Note (Signed)
Transition of Care [facility]) - CM/SW Discharge Note   Patient Details  Name: Cory Rivas MRN: [id number] Date of Birth: 1966/08/15  Transition of Care [facility]) CM/SW Contact:  [name], RN Phone Number: 11/24/2022, 10:36 AM   Clinical Narrative: 2/10: Discharge for today. To follow up with ambulatory lung cancer screen. Har Failure Nurse Navigator consulted with patient on 11/23/22. ON room air this am with SpO2 recorded at 95% per flowsheets at 0850. [name] RN CM     Final next level of care: Home/Self Care Barriers to Discharge: Barriers Resolved   Patient Goals and CMS Choice      Discharge Placement                         Discharge Plan and Services Additional resources added to the After Visit Summary for                  DME Arranged: N/A DME Agency: NA       HH Arranged: NA HH Agency: NA        Social Determinants of Health (SDOH) Interventions SDOH Screenings   Food Insecurity: No Food Insecurity (11/23/2022)  Housing: Low Risk  (11/23/2022)  Transportation Needs: No Transportation Needs (11/23/2022)  Utilities: Not At Risk (11/23/2022)  Alcohol Screen: Low Risk  (09/20/2022)  Depression (PHQ2-9): Low Risk  (11/13/2022)  Financial Resource Strain: Medium Risk (07/30/2019)  Physical Activity: Inactive (03/18/2019)  Social Connections: Unknown (07/30/2019)  Stress: Stress Concern Present (03/18/2019)  Tobacco Use: Medium Risk (11/22/2022)     Readmission Risk Interventions     No data to display

## 2022-11-24 NOTE — Discharge Summary (Signed)
Physician Discharge Summary  Cory Rivas [id number] DOB: Oct 30, 1965 DOA: 11/22/2022  PCP: Delsa Grana, PA-C  Admit date: 11/22/2022 Discharge date: 11/24/2022  Admitted From: Home Disposition:  Home  Recommendations for Outpatient Follow-up:  Follow up with PCP in 1-2 weeks Follow-up outpatient cardiology  Home Health: No Equipment/Devices: None  Discharge Condition: Stable CODE STATUS: Full Diet recommendation: Heart healthy  Brief/Interim Summary:  57 y.o. male with medical history significant DM, HTN, diastolic CHF, gout, chronic pain who presents to the ED with shortness of breath that has been progressively worsening over the past 24 hours.  Reports running out of his medication 3 days ago.    Has orthopnea.  Denies lower extremity edema. Denies cough, fever or chills. ED course and data review: BP on arrival 208/126 and tachypneic to 27.  Oxygen saturation intermittently down to 91, dropping to the mid to high 80s with exertion and becoming increasingly tachypneic to 35 thus requiring BiPAP for work of breathing.  Labs significant for troponin of 31, BNP 218.  WBC 10,900.  Respiratory viral panel still pending. EKG, personally viewed and interpreted showing NSR at 86 with nonspecific ST-T wave changes.  Chest x-ray showing the following: Cardiomegaly with pulmonary edema and possible small bilateral pleural effusions.  Weaned off BiPAP.  Diuresed effectively.  On room air at time of discharge.  Volume status optimized.  At time of discharge will recommend increase of home dose of Lasix to 40 mg from 20 mg daily.  Follow-up outpatient PCP and cardiology.  Appreciate consultation from heart failure nurse navigator.    Discharge Diagnoses:  Principal Problem:   Hypertensive emergency Active Problems:   Acute on chronic diastolic congestive heart failure (HCC)   Acute respiratory failure with hypoxia (HCC)   Elevated troponin   Diabetes mellitus (HCC)   Idiopathic chronic  gout of multiple sites without tophus   Class 2 severe obesity with serious comorbidity and body mass index (BMI) of 39.0 to 39.9 in adult [facility])   Other chronic pain   Acute decompensated heart failure ([place])  Hypertensive emergency Acute on chronic diastolic CHF Acute respiratory failure with hypoxia Patient presented with acute work of breathing requiring BiPAP NIPPV now weaned off Plan: Can resume home antihypertensive regimen.  Will increase home dose of Lasix to 40 mg daily.  Follow-up outpatient PCP and cardiology   Elevated troponin Suspect demand ischemia.  EKG is nonacute   Class 2 severe obesity with serious comorbidity and body mass index (BMI) of 39.0 to 39.9 in adult [facility]) Complicating factor to overall prognosis and care   Idiopathic chronic gout of multiple sites without tophus Chronic pain Continue allopurinol   Diabetes mellitus ([name]) Controlled.  Continue basal insulin, Jardiance with sliding scale coverage   Tobacco use Nicotine lozenges   Hyperlipidemia PTA statin   COPD PTA Symbicort   Discharge Instructions  Discharge Instructions     Ambulatory Referral for Lung Cancer Scre   Complete by: As directed    Diet - low sodium heart healthy   Complete by: As directed    Increase activity slowly   Complete by: As directed       Allergies as of 11/24/2022       Reactions   Oxycodone Nausea And Vomiting   Onion Nausea And Vomiting        Medication List     STOP taking these medications    amoxicillin 500 MG capsule Commonly known as: AMOXIL   budesonide-formoterol 160-4.5 MCG/ACT inhaler  Commonly known as: Symbicort       TAKE these medications    allopurinol 100 MG tablet Commonly known as: ZYLOPRIM TAKE 1 TABLET BY MOUTH ONCE A DAY. TAKE WITH 300 MG TABLETS   allopurinol 300 MG tablet Commonly known as: ZYLOPRIM Take one 300 mg tab po daily WITH 100 mg tab for total of 400 mg once daily for gout   benzonatate 200 MG  capsule Commonly known as: TESSALON Take 1 capsule (200 mg total) by mouth 2 (two) times daily as needed for cough.   DULoxetine 30 MG capsule Commonly known as: CYMBALTA TAKE 1 CAPSULE BY MOUTH 2 TIMES DAILY   empagliflozin 10 MG Tabs tablet Commonly known as: Jardiance Take 1 tablet (10 mg total) by mouth daily before breakfast. Bringing voucher   fluticasone 50 MCG/ACT nasal spray Commonly known as: FLONASE Place 1 spray into both nostrils daily.   furosemide 40 MG tablet Commonly known as: LASIX Take 1 tablet (40 mg total) by mouth daily. TAKE 1 TABLET BY MOUTH DAILY. TAKE WITH POTASSIUM. What changed:  medication strength how much to take how to take this when to take this   indomethacin 25 MG capsule Commonly known as: INDOCIN TAKE 2 TABLETS (50 MG) BY 3 TIMES DAILY FOR 2 DAYS AT ONSET OF GOUT FLARE UP, THEN TAKE 1(25MG)TABLET BY MOUTH FOR 3 DAYS   insulin lispro 100 UNIT/ML KwikPen Commonly known as: HUMALOG Inject 1-45 Units into the skin 3 (three) times daily. Sliding scale up to 45 units per injection   ipratropium-albuterol 0.5-2.5 (3) MG/3ML Soln Commonly known as: DUONEB Take 3 mLs by nebulization every 6 (six) hours as needed (SOB wheeze cough).   Lantus SoloStar 100 UNIT/ML Solostar Pen Generic drug: insulin glargine Inject 25 Units into the skin at bedtime.   levocetirizine 5 MG tablet Commonly known as: XYZAL Take 1 tablet (5 mg total) by mouth daily.   metFORMIN 1000 MG tablet Commonly known as: GLUCOPHAGE TAKE 1 TABLET BY MOUTH TWICE A DAY WITH A MEAL   nicotine polacrilex 2 MG lozenge Commonly known as: Nicotine Mini Take 1 lozenge (2 mg total) by mouth every 2 (two) hours as needed for smoking cessation.   Olopatadine HCl 0.2 % Soln Apply 1 drop to eye 2 (two) times daily as needed (eye allergies).   potassium chloride SA 20 MEQ tablet Commonly known as: KLOR-CON M Take 1 tablet (20 mEq total) by mouth daily. Take with furosemide    pregabalin 25 MG capsule Commonly known as: LYRICA TAKE 1 CAPSULE BY MOUTH IN THE MORNING AND 1 TO 3 CAPSULES EVERY NIGHT FOR CERVICAL RADICULOPATHY AND NEUROPATHY   rosuvastatin 40 MG tablet Commonly known as: Crestor Take 1 tablet (40 mg total) by mouth daily.   Spiriva Respimat 2.5 MCG/ACT Aers Generic drug: Tiotropium Bromide Monohydrate Inhale 2 puffs into the lungs daily.   Trulicity 3 0000000 Sopn Generic drug: Dulaglutide Inject into the skin.   Ventolin HFA 108 (90 Base) MCG/ACT inhaler Generic drug: albuterol INHALE 2 PUFFS INTO THE LUNGS EVERY 6 HOURS AS NEEDED FOR WHEEZING OR SHORTNESS OF BREATH        Follow-up Information     Delsa Grana, PA-C. Schedule an appointment as soon as possible for a visit in 1 week(s).   Specialty: Family Medicine Contact information: [address] [place] [address] [phone]         Kate Sable, MD. Schedule an appointment as soon as possible for a visit  in 2 week(s).   Specialties: Cardiology, Radiology Contact information: [place] [address] [phone]                Allergies  Allergen Reactions   Oxycodone Nausea And Vomiting   Onion Nausea And Vomiting    Consultations: None   Procedures/Studies: DG Chest 1 View  Result Date: 11/22/2022 CLINICAL DATA:  Shortness of breath. EXAM: CHEST  1 VIEW COMPARISON:  Radiograph August 23, 2022 and chest CT August 24, 2022 FINDINGS: Cardiac enlargement with central vascular prominence. Diffuse bilateral interstitial opacities with Kerley B-lines. Possible layering small bilateral pleural effusions. Cervicothoracic spinal fusion hardware. IMPRESSION: Cardiomegaly with pulmonary edema and possible small bilateral pleural effusions. Electronically Signed   By: Dahlia Bailiff M.D.   On: 11/22/2022 17:29      Subjective: Seen and examined on the day of discharge.  Stable no distress.  Chest pain-free.  On room  air.  Appropriate for discharge home.  Discharge Exam: Vitals:   11/24/22 0900 11/24/22 1000  BP:    Pulse:    Resp: 19 15  Temp:    SpO2:     Vitals:   11/24/22 0800 11/24/22 0850 11/24/22 0900 11/24/22 1000  BP:  115/70    Pulse:  (!) 57    Resp: 20 18 19 15  $ Temp:  98.2 F (36.8 C)    TempSrc:  Oral    SpO2:  95%    Weight:      Height:        General: Pt is alert, awake, not in acute distress Cardiovascular: RRR, S1/S2 +, no rubs, no gallops Respiratory: CTA bilaterally, no wheezing, no rhonchi Abdominal: Soft, NT, ND, bowel sounds + Extremities: no edema, no cyanosis    The results of significant diagnostics from this hospitalization (including imaging, microbiology, ancillary and laboratory) are listed below for reference.     Microbiology: Recent Results (from the past 240 hour(s))  Resp panel by RT-PCR (RSV, Flu A&B, Covid) Anterior Nasal Swab     Status: None   Collection Time: 11/22/22  5:35 PM   Specimen: Anterior Nasal Swab  Result Value Ref Range Status   SARS Coronavirus 2 by RT PCR NEGATIVE NEGATIVE Final    Comment: (NOTE) SARS-CoV-2 target nucleic acids are NOT DETECTED.  The SARS-CoV-2 RNA is generally detectable in upper respiratory specimens during the acute phase of infection. The lowest concentration of SARS-CoV-2 viral copies this assay can detect is 138 copies/mL. A negative result does not preclude SARS-Cov-2 infection and should not be used as the sole basis for treatment or other patient management decisions. A negative result may occur with  improper specimen collection/handling, submission of specimen other than nasopharyngeal swab, presence of viral mutation(s) within the areas targeted by this assay, and inadequate number of viral copies(<138 copies/mL). A negative result must be combined with clinical observations, patient history, and epidemiological information. The expected result is Negative.  Fact Sheet for Patients:   EntrepreneurPulse.com.au  Fact Sheet for Healthcare Providers:  IncredibleEmployment.be  This test is no t yet approved or cleared by the [place] FDA and  has been authorized for detection and/or diagnosis of SARS-CoV-2 by FDA under an Emergency Use Authorization (EUA). This EUA will remain  in effect (meaning this test can be used) for the duration of the COVID-19 declaration under Section 564(b)(1) of the Act, 21 U.S.C.section 360bbb-3(b)(1), unless the authorization is terminated  or revoked sooner.  Influenza A by PCR NEGATIVE NEGATIVE Final   Influenza B by PCR NEGATIVE NEGATIVE Final    Comment: (NOTE) The Xpert Xpress SARS-CoV-2/FLU/RSV plus assay is intended as an aid in the diagnosis of influenza from Nasopharyngeal swab specimens and should not be used as a sole basis for treatment. Nasal washings and aspirates are unacceptable for Xpert Xpress SARS-CoV-2/FLU/RSV testing.  Fact Sheet for Patients: EntrepreneurPulse.com.au  Fact Sheet for Healthcare Providers: IncredibleEmployment.be  This test is not yet approved or cleared by the [place] FDA and has been authorized for detection and/or diagnosis of SARS-CoV-2 by FDA under an Emergency Use Authorization (EUA). This EUA will remain in effect (meaning this test can be used) for the duration of the COVID-19 declaration under Section 564(b)(1) of the Act, 21 U.S.C. section 360bbb-3(b)(1), unless the authorization is terminated or revoked.     Resp Syncytial Virus by PCR NEGATIVE NEGATIVE Final    Comment: (NOTE) Fact Sheet for Patients: EntrepreneurPulse.com.au  Fact Sheet for Healthcare Providers: IncredibleEmployment.be  This test is not yet approved or cleared by the [place] FDA and has been authorized for detection and/or diagnosis of SARS-CoV-2 by FDA under an Emergency Use  Authorization (EUA). This EUA will remain in effect (meaning this test can be used) for the duration of the COVID-19 declaration under Section 564(b)(1) of the Act, 21 U.S.C. section 360bbb-3(b)(1), unless the authorization is terminated or revoked.  Performed at [facility], [address] [place], [place] [address]      Labs: BNP (last 3 results) Recent Labs    08/15/22 0709 08/23/22 0534 11/22/22 1700  BNP 162.7* 164.0* 123XX123*   Basic Metabolic Panel: Recent Labs  Lab 11/22/22 1120 11/22/22 1700 11/23/22 0527  NA 139 137 136  K 3.6 3.5 3.7  CL 104 100 102  CO2 26 25 26  $ GLUCOSE 231* 124* 124*  BUN 15 13 17  $ CREATININE 1.21 1.21 1.33*  CALCIUM 8.5* 8.5* 8.3*   Liver Function Tests: No results for input(s): "AST", "ALT", "ALKPHOS", "BILITOT", "PROT", "ALBUMIN" in the last 168 hours. No results for input(s): "LIPASE", "AMYLASE" in the last 168 hours. No results for input(s): "AMMONIA" in the last 168 hours. CBC: Recent Labs  Lab 11/22/22 1700  WBC 10.6*  HGB 15.1  HCT 45.6  MCV 84.0  PLT 233   Cardiac Enzymes: No results for input(s): "CKTOTAL", "CKMB", "CKMBINDEX", "TROPONINI" in the last 168 hours. BNP: Invalid input(s): "POCBNP" CBG: Recent Labs  Lab 11/22/22 2141 11/23/22 0824 11/23/22 1544 11/23/22 2117 11/24/22 0756  GLUCAP 145* 123* 83 171* 139*   D-Dimer No results for input(s): "DDIMER" in the last 72 hours. Hgb A1c No results for input(s): "HGBA1C" in the last 72 hours. Lipid Profile No results for input(s): "CHOL", "HDL", "LDLCALC", "TRIG", "CHOLHDL", "LDLDIRECT" in the last 72 hours. Thyroid function studies No results for input(s): "TSH", "T4TOTAL", "T3FREE", "THYROIDAB" in the last 72 hours.  Invalid input(s): "FREET3" Anemia work up No results for input(s): "VITAMINB12", "FOLATE", "FERRITIN", "TIBC", "IRON", "RETICCTPCT" in the last 72 hours. Urinalysis    Component Value Date/Time   COLORURINE YELLOW 07/03/2021  1405   APPEARANCEUR CLEAR 07/03/2021 1405   APPEARANCEUR Clear 04/28/2014 2040   LABSPEC >1.030 (H) 07/03/2021 1405   LABSPEC 1.030 04/28/2014 2040   PHURINE 5.5 07/03/2021 1405   GLUCOSEU 100 (A) 07/03/2021 1405   GLUCOSEU Negative 04/28/2014 2040   HGBUR TRACE (A) 07/03/2021 1405   BILIRUBINUR Negative 06/27/2022 1527   BILIRUBINUR Negative 04/28/2014 2040  KETONESUR NEGATIVE 07/03/2021 1405   PROTEINUR Positive (A) 06/27/2022 1527   PROTEINUR >300 (A) 07/03/2021 1405   UROBILINOGEN 0.2 06/27/2022 1527   NITRITE Negative 06/27/2022 1527   NITRITE NEGATIVE 07/03/2021 1405   LEUKOCYTESUR Negative 06/27/2022 1527   LEUKOCYTESUR NEGATIVE 07/03/2021 1405   LEUKOCYTESUR Negative 04/28/2014 2040   Sepsis Labs Recent Labs  Lab 11/22/22 1700  WBC 10.6*   Microbiology Recent Results (from the past 240 hour(s))  Resp panel by RT-PCR (RSV, Flu A&B, Covid) Anterior Nasal Swab     Status: None   Collection Time: 11/22/22  5:35 PM   Specimen: Anterior Nasal Swab  Result Value Ref Range Status   SARS Coronavirus 2 by RT PCR NEGATIVE NEGATIVE Final    Comment: (NOTE) SARS-CoV-2 target nucleic acids are NOT DETECTED.  The SARS-CoV-2 RNA is generally detectable in upper respiratory specimens during the acute phase of infection. The lowest concentration of SARS-CoV-2 viral copies this assay can detect is 138 copies/mL. A negative result does not preclude SARS-Cov-2 infection and should not be used as the sole basis for treatment or other patient management decisions. A negative result may occur with  improper specimen collection/handling, submission of specimen other than nasopharyngeal swab, presence of viral mutation(s) within the areas targeted by this assay, and inadequate number of viral copies(<138 copies/mL). A negative result must be combined with clinical observations, patient history, and epidemiological information. The expected result is Negative.  Fact Sheet for  Patients:  EntrepreneurPulse.com.au  Fact Sheet for Healthcare Providers:  IncredibleEmployment.be  This test is no t yet approved or cleared by the [place] FDA and  has been authorized for detection and/or diagnosis of SARS-CoV-2 by FDA under an Emergency Use Authorization (EUA). This EUA will remain  in effect (meaning this test can be used) for the duration of the COVID-19 declaration under Section 564(b)(1) of the Act, 21 U.S.C.section 360bbb-3(b)(1), unless the authorization is terminated  or revoked sooner.       Influenza A by PCR NEGATIVE NEGATIVE Final   Influenza B by PCR NEGATIVE NEGATIVE Final    Comment: (NOTE) The Xpert Xpress SARS-CoV-2/FLU/RSV plus assay is intended as an aid in the diagnosis of influenza from Nasopharyngeal swab specimens and should not be used as a sole basis for treatment. Nasal washings and aspirates are unacceptable for Xpert Xpress SARS-CoV-2/FLU/RSV testing.  Fact Sheet for Patients: EntrepreneurPulse.com.au  Fact Sheet for Healthcare Providers: IncredibleEmployment.be  This test is not yet approved or cleared by the [place] FDA and has been authorized for detection and/or diagnosis of SARS-CoV-2 by FDA under an Emergency Use Authorization (EUA). This EUA will remain in effect (meaning this test can be used) for the duration of the COVID-19 declaration under Section 564(b)(1) of the Act, 21 U.S.C. section 360bbb-3(b)(1), unless the authorization is terminated or revoked.     Resp Syncytial Virus by PCR NEGATIVE NEGATIVE Final    Comment: (NOTE) Fact Sheet for Patients: EntrepreneurPulse.com.au  Fact Sheet for Healthcare Providers: IncredibleEmployment.be  This test is not yet approved or cleared by the [place] FDA and has been authorized for detection and/or diagnosis of SARS-CoV-2 by FDA under an Emergency  Use Authorization (EUA). This EUA will remain in effect (meaning this test can be used) for the duration of the COVID-19 declaration under Section 564(b)(1) of the Act, 21 U.S.C. section 360bbb-3(b)(1), unless the authorization is terminated or revoked.  Performed at [facility], [address] [place], [place] [address]  Time coordinating discharge: Over 30 minutes  SIGNED:   Sidney Ace, MD  [facility] 11/24/2022, 11:31 AM Pager   If 7PM-7AM, please contact night-coverage

## 2022-11-26 LAB — GLUCOSE, CAPILLARY: Glucose-Capillary: 181 mg/dL — ABNORMAL HIGH (ref 70–99)

## 2022-11-27 ENCOUNTER — Encounter: Payer: Self-pay | Admitting: Family

## 2022-11-27 ENCOUNTER — Ambulatory Visit (HOSPITAL_BASED_OUTPATIENT_CLINIC_OR_DEPARTMENT_OTHER): Payer: Medicaid Other | Admitting: Family

## 2022-11-27 ENCOUNTER — Other Ambulatory Visit
Admission: RE | Admit: 2022-11-27 | Discharge: 2022-11-27 | Disposition: A | Discharge status: Home / Self Care | Payer: Medicaid Other | Source: Ambulatory Visit | Attending: Family | Admitting: Family

## 2022-11-27 ENCOUNTER — Telehealth: Payer: Self-pay | Admitting: Student in an Organized Health Care Education/Training Program

## 2022-11-27 VITALS — BP 128/76 | HR 65 | Resp 20 | Wt 226.0 lb

## 2022-11-27 DIAGNOSIS — Z720 Tobacco use: Secondary | ICD-10-CM

## 2022-11-27 DIAGNOSIS — Z794 Long term (current) use of insulin: Secondary | ICD-10-CM | POA: Diagnosis not present

## 2022-11-27 DIAGNOSIS — E119 Type 2 diabetes mellitus without complications: Secondary | ICD-10-CM

## 2022-11-27 DIAGNOSIS — I10 Essential (primary) hypertension: Secondary | ICD-10-CM | POA: Diagnosis not present

## 2022-11-27 DIAGNOSIS — I5030 Unspecified diastolic (congestive) heart failure: Secondary | ICD-10-CM | POA: Diagnosis not present

## 2022-11-27 DIAGNOSIS — J45909 Unspecified asthma, uncomplicated: Secondary | ICD-10-CM | POA: Diagnosis not present

## 2022-11-27 LAB — BASIC METABOLIC PANEL
Anion gap: 14 (ref 5–15)
BUN: 26 mg/dL — ABNORMAL HIGH (ref 6–20)
CO2: 25 mmol/L (ref 22–32)
Calcium: 9.2 mg/dL (ref 8.9–10.3)
Chloride: 99 mmol/L (ref 98–111)
Creatinine, Ser: 1.5 mg/dL — ABNORMAL HIGH (ref 0.61–1.24)
GFR, Estimated: 55 mL/min — ABNORMAL LOW (ref >60)
Glucose, Bld: 127 mg/dL — ABNORMAL HIGH (ref 70–99)
Potassium: 3.8 mmol/L (ref 3.5–5.1)
Sodium: 138 mmol/L (ref 135–145)

## 2022-11-27 NOTE — Progress Notes (Signed)
   11/27/22 1448  ReDS Vest / Clip  Station Marker D  Ruler Value 41  ReDS Actual Value 33

## 2022-11-27 NOTE — Telephone Encounter (Signed)
I tried calling [facility] today and had to leave a message their message stated that they are having some technical difficulties and would call us back as soon as possible

## 2022-11-27 NOTE — Telephone Encounter (Signed)
Per Jenny Reichmann with [facility] they faxed the sleep study to [place] on 11/14/22 fax # [phone] we found out today they have been faxing the sleep studies to [facility] in [place].  Jenny Reichmann will be faxing the sleep study to the correct fax # [phone]

## 2022-11-27 NOTE — Patient Instructions (Signed)
Continue weighing daily and call for an overnight weight gain of 3 pounds or more or a weekly weight gain of more than 5 pounds. ? ? ?If you have voicemail, please make sure your mailbox is cleaned out so that we may leave a message and please make sure to listen to any voicemails.  ? ? ?

## 2022-11-27 NOTE — Telephone Encounter (Signed)
Cory Rivas, can you assist with this? Appears that patient had HST at sleep center?

## 2022-11-27 NOTE — Telephone Encounter (Signed)
Patient is requesting refill on nicotine lozenges. Patient's sister, Wendy(DPR) is aware that I will contact her once we have approval from Dr. Genia Harold.  Dr. Genia Harold, please advise. Thanks

## 2022-11-27 NOTE — Progress Notes (Signed)
Patient ID: Cory Rivas, male    DOB: [birth year], 57 y.o.   MRN: [id number]  HPI  Cory Rivas is a 57 y/o male with a history of DM, hyperlipidemia, HTN, gout, tobacco use and chronic heart failure.   Echo report from 08/23/22 showed an EF of 60-65% along with mild LVH & mild Cory.   Admitted 11/22/22 due to acute on chronic HF. Weaned off of bipap.Admitted 08/23/22 due to dyspnea, productive cough of brown sputum, mild sore throat, fatigue, dizziness and chest soreness. Steroid burst given. Initially given IV lasix with transition to oral diuretics. Lisinopril held due to AKI. Discharged after 2 days. Was in the ED 08/15/22 due to acute pulmonary edema where he was treated and released.    He presents today for a follow-up visit with a chief complaint of minimal fatigue with moderate exertion. Describes this as chronic in nature. Has associated dizziness along with this. Denies any difficulty sleeping, cough, SOB, wheezing, chest pain, pedal edema, palpitations, abdominal distention or weight gain.   Past Medical History:  Diagnosis Date   Bulging of cervical intervertebral disc    CHF (congestive heart failure) (HCC)    Diabetes mellitus without complication (HCC)    Gout    High cholesterol    Hypertension    Stress due to family tension 04/15/2019   Past Surgical History:  Procedure Laterality Date   CHOLECYSTECTOMY     COLONOSCOPY WITH PROPOFOL N/A 03/10/2020   Procedure: COLONOSCOPY WITH PROPOFOL;  Surgeon: Lucilla Lame, MD;  Location: [facility] ENDOSCOPY;  Service: Endoscopy;  Laterality: N/A;   Family History  Problem Relation Age of Onset   Heart disease Mother    Diabetes Mother    Hyperlipidemia Mother    Hypertension Mother    Heart attack Mother    Lung cancer Father    Hypertension Father    Stroke Father    AAA (abdominal aortic aneurysm) Maternal Grandmother    Heart attack Maternal Grandmother    Diabetes Maternal Grandfather    Social History   Tobacco Use   Smoking  status: Former    Packs/day: 1.00    Years: 37.00    Total pack years: 37.00    Types: Cigarettes    Start date: 10/19/1989    Quit date: 08/20/2022    Years since quitting: 0.2   Smokeless tobacco: Never   Tobacco comments:    30+ years hx smoking 2 ppd, stopped smoking briefly in April 2022 for a surgery and then restarted smoking about 1/2 ppd last 4-5 months     Quit smoking 08/20/2022  Substance Use Topics   Alcohol use: No   Allergies  Allergen Reactions   Oxycodone Nausea And Vomiting   Onion Nausea And Vomiting   Prior to Admission medications   Medication Sig Start Date End Date Taking? Authorizing Provider  allopurinol (ZYLOPRIM) 300 MG tablet Take one 300 mg tab po daily WITH 100 mg tab for total of 400 mg once daily for gout 08/30/22  Yes Cory Grana, PA-C  benzonatate (TESSALON) 200 MG capsule Take 1 capsule (200 mg total) by mouth 2 (two) times daily as needed for cough. 11/13/22  Yes Mecum, Erin E, PA-C  DULoxetine (CYMBALTA) 30 MG capsule TAKE 1 CAPSULE BY MOUTH 2 TIMES DAILY 10/01/22  Yes Cory Grana, PA-C  empagliflozin (JARDIANCE) 10 MG TABS tablet Take 1 tablet (10 mg total) by mouth daily before breakfast. Bringing voucher 10/22/22  Yes Alisa Graff, FNP  fluticasone (  FLONASE) 50 MCG/ACT nasal spray Place 1 spray into both nostrils daily. 08/01/22 08/01/23 Yes Dgayli, Berdine Addison, MD  furosemide (LASIX) 40 MG tablet Take 1 tablet (40 mg total) by mouth daily. TAKE 1 TABLET BY MOUTH DAILY. TAKE WITH POTASSIUM. 11/24/22 12/24/22 Yes Sreenath, Sudheer B, MD  indomethacin (INDOCIN) 25 MG capsule TAKE 2 TABLETS (50 MG) BY 3 TIMES DAILY FOR 2 DAYS AT ONSET OF GOUT FLARE UP, THEN TAKE 1(25MG)TABLET BY MOUTH FOR 3 DAYS 09/05/21  Yes Cory Grana, PA-C  insulin glargine (LANTUS SOLOSTAR) 100 UNIT/ML Solostar Pen Inject 25 Units into the skin at bedtime. 11/01/20  Yes [provider]  insulin lispro (HUMALOG) 100 UNIT/ML KwikPen Inject 1-45 Units into the skin 3 (three) times  daily. Sliding scale up to 45 units per injection 11/08/20  Yes [provider]  ipratropium-albuterol (DUONEB) 0.5-2.5 (3) MG/3ML SOLN Take 3 mLs by nebulization every 6 (six) hours as needed (SOB wheeze cough). 07/23/22  Yes Cory Grana, PA-C  levocetirizine (XYZAL) 5 MG tablet Take 1 tablet (5 mg total) by mouth daily. 08/01/22 08/01/23 Yes Dgayli, Berdine Addison, MD  metFORMIN (GLUCOPHAGE) 1000 MG tablet TAKE 1 TABLET BY MOUTH TWICE A DAY WITH A MEAL 03/06/22  Yes Cory Grana, PA-C  nicotine polacrilex (NICOTINE MINI) 2 MG lozenge Take 1 lozenge (2 mg total) by mouth every 2 (two) hours as needed for smoking cessation. 09/24/22 12/23/22 Yes Dgayli, Berdine Addison, MD  Olopatadine HCl 0.2 % SOLN Apply 1 drop to eye 2 (two) times daily as needed (eye allergies). 01/29/22  Yes Cory Grana, PA-C  potassium chloride SA (KLOR-CON M) 20 MEQ tablet Take 1 tablet (20 mEq total) by mouth daily. Take with furosemide 08/28/22  Yes Tapia, Leisa, PA-C  pregabalin (LYRICA) 25 MG capsule TAKE 1 CAPSULE BY MOUTH IN THE MORNING AND 1 TO 3 CAPSULES EVERY NIGHT FOR CERVICAL RADICULOPATHY AND NEUROPATHY 07/10/22  Yes Cory Grana, PA-C  rosuvastatin (CRESTOR) 40 MG tablet Take 1 tablet (40 mg total) by mouth daily. 01/26/22  Yes Cory Grana, PA-C  Tiotropium Bromide Monohydrate (SPIRIVA RESPIMAT) 2.5 MCG/ACT AERS Inhale 2 puffs into the lungs daily. 09/24/22  Yes Dgayli, Berdine Addison, MD  TRULICITY 3 0000000 SOPN Inject into the skin. 03/26/22  Yes [provider]  VENTOLIN HFA 108 (90 Base) MCG/ACT inhaler INHALE 2 PUFFS INTO THE LUNGS EVERY 6 HOURS AS NEEDED FOR WHEEZING OR SHORTNESS OF BREATH 04/05/22  Yes Cory Grana, PA-C  allopurinol (ZYLOPRIM) 100 MG tablet TAKE 1 TABLET BY MOUTH ONCE A DAY. TAKE WITH 300 MG TABLETS Patient not taking: Reported on 11/27/2022 08/30/22   Cory Grana, PA-C    Review of Systems  Constitutional:  Positive for fatigue.  HENT:  Negative for postnasal drip.   Respiratory:  Negative for  chest tightness, shortness of breath and wheezing.   Cardiovascular:  Negative for chest pain, palpitations and leg swelling.  Gastrointestinal:  Negative for abdominal distention and abdominal pain.  Neurological:  Positive for dizziness.  Psychiatric/Behavioral:  Negative for sleep disturbance.    Vitals:   11/27/22 1430  BP: 128/76  Pulse: 65  Resp: 20  SpO2: 97%  Weight: 226 lb (102.5 kg)   Wt Readings from Last 3 Encounters:  11/27/22 226 lb (102.5 kg)  11/24/22 223 lb 8 oz (101.4 kg)  11/22/22 231 lb 4 oz (104.9 kg)   Lab Results  Component Value Date   CREATININE 1.50 (H) 11/27/2022   CREATININE 1.33 (H) 11/23/2022   CREATININE 1.21 11/22/2022   Physical  Exam Vitals and nursing note reviewed.  Constitutional:      Appearance: He is well-developed.  HENT:     Head: Normocephalic and atraumatic.  Neck:     Vascular: No JVD.  Cardiovascular:     Rate and Rhythm: Regular rhythm.  Pulmonary:     Effort: Pulmonary effort is normal.     Breath sounds: No wheezing, rhonchi or rales.  Abdominal:     Palpations: Abdomen is soft.     Tenderness: There is no abdominal tenderness.  Musculoskeletal:        General: No tenderness.     Cervical back: Normal range of motion and neck supple.     Right lower leg: No tenderness. Edema (trace pitting) present.     Left lower leg: No tenderness. Edema (trace pitting) present.  Skin:    General: Skin is warm and dry.  Neurological:     General: No focal deficit present.     Mental Status: He is alert and oriented to person, place, and time.  Psychiatric:        Mood and Affect: Mood normal.        Behavior: Behavior normal.    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- - NYHA class II - euvolemic - weighing daily; reminded to call for an overnight weight gain of >2 pounds or a weekly weight gain of > 5 pounds - weight down 5 pounds from last visit here 1 week ago - jardiance  47m QD - BMP today - consider entresto - ReDs clip reading was 33% - saw cardiology (Agbor-Etang) 09/21/22 - drinking 4-5 water bottles and 1 soda daily - BNP 11/22/22 was 218.3 - has received his flu vaccine for this season  2: HTN- - BP 128/76 - saw PCP (Mecum) 11/13/22  - BMP 11/23/22 reviewed and showed sodium 136, potassium 3.7, creatinine 1.33 and GFR >60  3: DM- - A1c 08/23/22 was 7.5% - currently on trulicity, metformin and insulin - had telemedicine visit with endocrinology 10/18/22 - home glucose this morning was 125  4: Reactive airway disease- - saw pulmonology (Dgayli) 09/24/22 - using spiriva & xyzal - hasn't smoked in ~ 2 months   Medication list reviewed  .  Return in 1 month, sooner if needed.

## 2022-12-04 MED ORDER — NICOTINE POLACRILEX 2 MG MT LOZG: 2.0000 mg | LOZENGE | OROMUCOSAL | 6 refills | Status: DC | PRN

## 2022-12-04 NOTE — Telephone Encounter (Signed)
Dr. Genia Harold, please advise. Thanks

## 2022-12-04 NOTE — Telephone Encounter (Signed)
Patient's sister, Wendy(DPR) is aware of below message and voiced her understanding.  Nothing further needed.

## 2022-12-05 NOTE — Patient Instructions (Incomplete)

## 2022-12-06 ENCOUNTER — Ambulatory Visit: Payer: Medicaid Other | Admitting: Family Medicine

## 2022-12-06 ENCOUNTER — Encounter: Payer: Self-pay | Admitting: Family Medicine

## 2022-12-06 VITALS — BP 170/84 | HR 84 | Temp 98.3°F | Resp 16 | Ht 66.25 in | Wt 229.5 lb

## 2022-12-06 DIAGNOSIS — Z125 Encounter for screening for malignant neoplasm of prostate: Secondary | ICD-10-CM

## 2022-12-06 DIAGNOSIS — Z0000 Encounter for general adult medical examination without abnormal findings: Secondary | ICD-10-CM | POA: Diagnosis not present

## 2022-12-06 NOTE — Progress Notes (Signed)
Patient: Cory Rivas, Male    DOB: 09-06-1966, 57 y.o.   MRN: [id number] Delsa Grana, PA-C Visit Date: 12/06/2022  Today's Provider: Delsa Grana, PA-C   Chief Complaint  Patient presents with   Annual Exam   Subjective:   Annual physical exam:  Cory Rivas is a 57 y.o. male who presents today for health maintenance and annual & complete physical exam.   Exercise/Activity:  walking/exercising 3x weekly 10+ min and also does physical work often at home Diet/nutrition:  working on [employer], cut out fast food Sleep:    [place] Present (12/06/2022)  Housing: [place]  (12/06/2022)  Transportation Needs: No Transportation Needs (12/06/2022)  Utilities: Not At Risk (12/06/2022)  Alcohol Screen: Low Risk  (12/06/2022)  Depression (PHQ2-9): Low Risk  (12/06/2022)  Financial Resource Strain: Medium Risk (12/06/2022)  Physical Activity: Insufficiently Active (12/06/2022)  Social Connections: Socially Isolated (12/06/2022)  Stress: No Stress Concern Present (12/06/2022)  Tobacco Use: Medium Risk (12/06/2022)    USPSTF grade A and B recommendations - reviewed and addressed today  Depression:  Phq 9 completed today by patient, was reviewed by me with patient in the room, score is  negative, pt feels good    12/06/2022    8:50 AM 11/13/2022    2:58 PM 09/20/2022    2:40 PM  Depression screen PHQ 2/9  Decreased Interest 0 0 0  Down, Depressed, Hopeless 0 0 0  PHQ - 2 Score 0 0 0  Altered sleeping 0 0   Tired, decreased energy 0 0   Change in appetite 0 0   Feeling bad or failure about yourself  0 0   Trouble concentrating 0 0   Moving slowly or fidgety/restless 0 0   Suicidal thoughts 0 0   PHQ-9 Score 0 0   Difficult doing work/chores Not difficult at all Not difficult at all     Hep C Screening: done  STD testing and prevention (HIV/chl/gon/syphilis):  declines  Intimate partner violence: feels safe, lives with sister  Abigail Butts and husband and pt's mom  Advanced Care Planning:  A voluntary discussion about advance care planning including the explanation and discussion of advance directives.  Discussed health care proxy and Living will, and the patient was able to identify a health care proxy as sister Abigail Butts.  Patient does not have a living will at present time. If patient does have living will, I have requested they bring this to the clinic to be scanned in to their chart.  Health Maintenance  Topic Date Due   Zoster Vaccines- Shingrix (1 of 2) Never done   COVID-19 Vaccine (4 - 2023-24 season) 12/22/2022 (Originally 06/15/2022)   OPHTHALMOLOGY EXAM  02/06/2023   HEMOGLOBIN A1C  02/21/2023   COLONOSCOPY (Pts 45-41yr Insurance coverage will need to be confirmed)  03/11/2023   Diabetic kidney evaluation - Urine ACR  06/28/2023   FOOT EXAM  08/10/2023   Lung Cancer Screening  08/25/2023   Diabetic kidney evaluation - eGFR measurement  11/28/2023   DTaP/Tdap/Td (2 - Td or Tdap) 04/14/2029   INFLUENZA VACCINE  Completed   Hepatitis C Screening  Completed   HIV Screening  Completed   HPV VACCINES  Aged Out    Skin cancer:  last skin survey was.  Pt reports no hx of skin cancer, suspicious lesions/biopsies in the past.  Colorectal cancer:  colonoscopy is due in May -  Pt denies change in bowels,  blood in stool  Prostate cancer:  Prostate cancer screening with PSA: Discussed risks and benefits of PSA testing and provided handout. Pt wishes to have PSA drawn today. Lab Results  Component Value Date   PSA 1.72 12/04/2021   PSA 0.9 06/18/2019    Urinary Symptoms:   IPSS     Row Name 12/06/22 [id number]         International Prostate Symptom Score   How often have you had the sensation of not emptying your bladder? Not at All     How often have you had to urinate less than every two hours? More than half the time     How often have you found you stopped and started again several times when you urinated?  Less than half the time     How often have you found it difficult to postpone urination? Not at All     How often have you had a weak urinary stream? Not at All     How often have you had to strain to start urination? Not at All     How many times did you typically get up at night to urinate? 3 Times     Total IPSS Score 9       Quality of Life due to urinary symptoms   If you were to spend the rest of your life with your urinary condition just the way it is now how would you feel about that? Mostly Satisfied              Lung cancer:   Low Dose CT Chest recommended if Age 55-80 years, 20 pack-year currently smoking OR have quit w/in 15years. Patient does qualify.    Social History   Tobacco Use   Smoking status: Former    Packs/day: 1.00    Years: 37.00    Total pack years: 37.00    Types: Cigarettes    Start date: 10/19/1989    Quit date: 08/20/2022    Years since quitting: 0.2   Smokeless tobacco: Never   Tobacco comments:    30+ years hx smoking 2 ppd, stopped smoking briefly in April 2022 for a surgery and then restarted smoking about 1/2 ppd last 4-5 months     Quit smoking 08/20/2022  Substance Use Topics   Alcohol use: No     Alcohol screening: [place] Visit from 12/06/2022 in [facility]  AUDIT-C Score 0       AAA:  The USPSTF recommends one-time screening with ultrasonography in men ages 58 to 31 years who have ever smoked  ECG:not due today - seeing cardiology  Blood pressure/Hypertension: BP Readings from Last 3 Encounters:  12/06/22 (!) 162/82  11/27/22 128/76  11/24/22 115/70   Weight/Obesity: Wt Readings from Last 3 Encounters:  12/06/22 229 lb 8 oz (104.1 kg)  11/27/22 226 lb (102.5 kg)  11/24/22 223 lb 8 oz (101.4 kg)   BMI Readings from Last 3 Encounters:  12/06/22 36.76 kg/m  11/27/22 35.40 kg/m  11/24/22 35.01 kg/m    Lipids:  Lab Results  Component Value Date   CHOL 224 (H) 06/08/2022    CHOL 209 (H) 03/06/2022   CHOL 248 (H) 12/04/2021   Lab Results  Component Value Date   HDL 36 (L) 06/08/2022   HDL 33 (L) 03/06/2022   HDL 37 (L) 12/04/2021   Lab Results  Component Value Date   LDLCALC 135 (H) 06/08/2022   LDLCALC 133 (  H) 03/06/2022   LDLCALC 166 (H) 12/04/2021   Lab Results  Component Value Date   TRIG 266 (H) 06/08/2022   TRIG 280 (H) 03/06/2022   TRIG 274 (H) 12/04/2021   Lab Results  Component Value Date   CHOLHDL 6.2 06/08/2022   CHOLHDL 6.3 (H) 03/06/2022   CHOLHDL 6.7 (H) 12/04/2021   Lab Results  Component Value Date   LDLDIRECT 135 (H) 08/18/2021   Based on the results of lipid panel his/her cardiovascular risk factor ( using [facility] )  in the next 10 years is : The 10-year ASCVD risk score (Arnett DK, et al., 2019) is: 44.2%   Values used to calculate the score:     Age: 65 years     Sex: Male     Is Non-Hispanic African American: No     Diabetic: Yes     Tobacco smoker: Yes     Systolic Blood Pressure: 0000000 mmHg     Is BP treated: Yes     HDL Cholesterol: 36 mg/dL     Total Cholesterol: 224 mg/dL  Glucose:  Glucose, Bld  Date Value Ref Range Status  11/27/2022 127 (H) 70 - 99 mg/dL Final    Comment:    Glucose reference range applies only to samples taken after fasting for at least 8 hours.  11/23/2022 124 (H) 70 - 99 mg/dL Final    Comment:    Glucose reference range applies only to samples taken after fasting for at least 8 hours.  11/22/2022 124 (H) 70 - 99 mg/dL Final    Comment:    Glucose reference range applies only to samples taken after fasting for at least 8 hours.   Glucose-Capillary  Date Value Ref Range Status  11/24/2022 139 (H) 70 - 99 mg/dL Final    Comment:    Glucose reference range applies only to samples taken after fasting for at least 8 hours.  11/23/2022 171 (H) 70 - 99 mg/dL Final    Comment:    Glucose reference range applies only to samples taken after fasting for at least 8 hours.  11/23/2022  83 70 - 99 mg/dL Final    Comment:    Glucose reference range applies only to samples taken after fasting for at least 8 hours.    Social History       Social History   Socioeconomic History   Marital status: Divorced    Spouse name: Not on file   Number of children: 0   Years of education: 9   Highest education level: 9th grade  Occupational History   Occupation: disability  Tobacco Use   Smoking status: Former    Packs/day: 1.00    Years: 37.00    Total pack years: 37.00    Types: Cigarettes    Start date: 10/19/1989    Quit date: 08/20/2022    Years since quitting: 0.2   Smokeless tobacco: Never   Tobacco comments:    30+ years hx smoking 2 ppd, stopped smoking briefly in April 2022 for a surgery and then restarted smoking about 1/2 ppd last 4-5 months     Quit smoking 08/20/2022  Vaping Use   Vaping Use: Never used  Substance and Sexual Activity   Alcohol use: No   Drug use: No   Sexual activity: Not Currently    Partners: Female  Other Topics Concern   Not on file  Social History Narrative   Not on file   Social Determinants of  Health   Financial Resource Strain: Medium Risk (12/06/2022)   Overall Financial Resource Strain (CARDIA)    Difficulty of Paying Living Expenses: Somewhat hard  Food Insecurity: Food Insecurity Present (12/06/2022)   Hunger Vital Sign    Worried About Running Out of Food in the Last Year: Often true    Ran Out of Food in the Last Year: Often true  Transportation Needs: No Transportation Needs (12/06/2022)   PRAPARE - Hydrologist (Medical): No    Lack of Transportation (Non-Medical): No  Physical Activity: Insufficiently Active (12/06/2022)   Exercise Vital Sign    Days of Exercise per Week: 3 days    Minutes of Exercise per Session: 10 min  Stress: No Stress Concern Present (12/06/2022)   [place]    Feeling of Stress : Only a little   Social Connections: Socially Isolated (12/06/2022)   Social Connection and Isolation Panel [NHANES]    Frequency of Communication with Friends and Family: More than three times a week    Frequency of Social Gatherings with Friends and Family: Twice a week    Attends Religious Services: Never    Marine scientist or Organizations: No    Attends Music therapist: Never    Marital Status: Divorced    Family History        Family History  Problem Relation Age of Onset   Heart disease Mother    Diabetes Mother    Hyperlipidemia Mother    Hypertension Mother    Heart attack Mother    Lung cancer Father    Hypertension Father    Stroke Father    AAA (abdominal aortic aneurysm) Maternal Grandmother    Heart attack Maternal Grandmother    Diabetes Maternal Grandfather     Patient Active Problem List   Diagnosis Date Noted   Acute decompensated heart failure ([place]) 11/23/2022   Hypertensive emergency 11/22/2022   Acute respiratory failure with hypoxia ([place]) 11/22/2022   Elevated troponin 11/22/2022   CHF exacerbation ([place]) 08/24/2022   Acute on chronic diastolic congestive heart failure ([place]) 08/23/2022   Class 2 obesity 08/23/2022   Type 2 diabetes mellitus with hyperglycemia ([place]) 08/23/2022   Grade I diastolic dysfunction [id number]   LVH (left ventricular hypertrophy) 06/08/2022   Accelerated hypertension 06/08/2022   S/P cervical spinal fusion 08/18/2021   Former heavy tobacco smoker 08/18/2021   Gout 08/18/2021   Other chronic pain Q000111Q   Systolic murmur Q000111Q   Tobacco use 10/26/2020   Polyp of ascending colon    Rectal polyp    Class 2 severe obesity with serious comorbidity and body mass index (BMI) of 39.0 to 39.9 in adult [facility]) 04/15/2019   Chronic kidney disease (CKD) stage G2/A3, mildly decreased glomerular filtration rate (GFR) between 60-89 mL/min/1.73 square meter and albuminuria creatinine ratio greater than 300 mg/g 04/15/2019    Proteinuria 04/15/2019   Hyperlipidemia associated with type 2 diabetes mellitus ([place]) 03/18/2019   Diabetes mellitus ([place]) 03/18/2019   Idiopathic chronic gout of multiple sites without tophus 03/18/2019   Essential hypertension 03/18/2019   Dermatitis 03/18/2019   Lumbar degenerative disc disease 11/22/2015   Cervical myelopathy ([place]) 11/22/2015   Osteoarthritis of spine with radiculopathy, lumbar region 11/22/2015    Past Surgical History:  Procedure Laterality Date   CHOLECYSTECTOMY     COLONOSCOPY WITH PROPOFOL N/A 03/10/2020   Procedure: COLONOSCOPY WITH PROPOFOL;  Surgeon: Lucilla Lame, MD;  Location: [facility] ENDOSCOPY;  Service: Endoscopy;  Laterality: N/A;     Current Outpatient Medications:    allopurinol (ZYLOPRIM) 300 MG tablet, Take one 300 mg tab po daily WITH 100 mg tab for total of 400 mg once daily for gout, Disp: 90 tablet, Rfl: 1   benzonatate (TESSALON) 200 MG capsule, Take 1 capsule (200 mg total) by mouth 2 (two) times daily as needed for cough., Disp: 20 capsule, Rfl: 0   DULoxetine (CYMBALTA) 30 MG capsule, TAKE 1 CAPSULE BY MOUTH 2 TIMES DAILY, Disp: 180 capsule, Rfl: 1   empagliflozin (JARDIANCE) 10 MG TABS tablet, Take 1 tablet (10 mg total) by mouth daily before breakfast. Bringing voucher, Disp: 14 tablet, Rfl: 3   fluticasone (FLONASE) 50 MCG/ACT nasal spray, Place 1 spray into both nostrils daily., Disp: 18.2 mL, Rfl: 6   furosemide (LASIX) 40 MG tablet, Take 1 tablet (40 mg total) by mouth daily. TAKE 1 TABLET BY MOUTH DAILY. TAKE WITH POTASSIUM., Disp: 30 tablet, Rfl: 0   indomethacin (INDOCIN) 25 MG capsule, TAKE 2 TABLETS (50 MG) BY 3 TIMES DAILY FOR 2 DAYS AT ONSET OF GOUT FLARE UP, THEN TAKE 1(25MG)TABLET BY MOUTH FOR 3 DAYS, Disp: 20 capsule, Rfl: 3   insulin glargine (LANTUS SOLOSTAR) 100 UNIT/ML Solostar Pen, Inject 25 Units into the skin at bedtime., Disp: , Rfl:    insulin lispro (HUMALOG) 100 UNIT/ML KwikPen, Inject 1-45 Units into the skin 3 (three)  times daily. Sliding scale up to 45 units per injection, Disp: , Rfl:    ipratropium-albuterol (DUONEB) 0.5-2.5 (3) MG/3ML SOLN, Take 3 mLs by nebulization every 6 (six) hours as needed (SOB wheeze cough)., Disp: 180 mL, Rfl: 1   levocetirizine (XYZAL) 5 MG tablet, Take 1 tablet (5 mg total) by mouth daily., Disp: 30 tablet, Rfl: 11   metFORMIN (GLUCOPHAGE) 1000 MG tablet, TAKE 1 TABLET BY MOUTH TWICE A DAY WITH A MEAL, Disp: 180 tablet, Rfl: 3   nicotine polacrilex (NICOTINE MINI) 2 MG lozenge, Take 1 lozenge (2 mg total) by mouth every 2 (two) hours as needed for smoking cessation., Disp: 72 lozenge, Rfl: 6   Olopatadine HCl 0.2 % SOLN, Apply 1 drop to eye 2 (two) times daily as needed (eye allergies)., Disp: 2.5 mL, Rfl: 3   potassium chloride SA (KLOR-CON M) 20 MEQ tablet, Take 1 tablet (20 mEq total) by mouth daily. Take with furosemide, Disp: 30 tablet, Rfl: 3   pregabalin (LYRICA) 25 MG capsule, TAKE 1 CAPSULE BY MOUTH IN THE MORNING AND 1 TO 3 CAPSULES EVERY NIGHT FOR CERVICAL RADICULOPATHY AND NEUROPATHY, Disp: 270 capsule, Rfl: 1   rosuvastatin (CRESTOR) 40 MG tablet, Take 1 tablet (40 mg total) by mouth daily., Disp: 90 tablet, Rfl: 3   Tiotropium Bromide Monohydrate (SPIRIVA RESPIMAT) 2.5 MCG/ACT AERS, Inhale 2 puffs into the lungs daily., Disp: 30 each, Rfl: 11   TRULICITY 3 0000000 SOPN, Inject into the skin., Disp: , Rfl:    VENTOLIN HFA 108 (90 Base) MCG/ACT inhaler, INHALE 2 PUFFS INTO THE LUNGS EVERY 6 HOURS AS NEEDED FOR WHEEZING OR SHORTNESS OF BREATH, Disp: 18 g, Rfl: 2   allopurinol (ZYLOPRIM) 100 MG tablet, TAKE 1 TABLET BY MOUTH ONCE A DAY. TAKE WITH 300 MG TABLETS (Patient not taking: Reported on 11/27/2022), Disp: 90 tablet, Rfl: 1 No current facility-administered medications for this visit.  Facility-Administered Medications Ordered in Other Visits:    albuterol (PROVENTIL) (2.5 MG/3ML) 0.083% nebulizer solution 2.5 mg, 2.5 mg, Nebulization, Once, Dgayli,  Berdine Addison,  MD  Allergies  Allergen Reactions   Oxycodone Nausea And Vomiting   Onion Nausea And Vomiting    Patient Care Team: Delsa Grana, PA-C as PCP - General (Family Medicine) Kate Sable, MD as PCP - Cardiology (Cardiology)   Chart Review: I personally reviewed active problem list, medication list, allergies, family history, social history, health maintenance, notes from last encounter, lab results, imaging with the patient/caregiver today.   Review of Systems  Constitutional: Negative.   HENT: Negative.    Eyes: Negative.   Respiratory: Negative.    Cardiovascular: Negative.   Gastrointestinal: Negative.   Endocrine: Negative.   Genitourinary: Negative.   Musculoskeletal: Negative.   Skin: Negative.   Allergic/Immunologic: Negative.   Neurological: Negative.   Hematological: Negative.   Psychiatric/Behavioral: Negative.    All other systems reviewed and are negative.         Objective:   Vitals:  Vitals:   12/06/22 0850  BP: (!) 162/82  Pulse: 84  Resp: 16  Temp: 98.3 F (36.8 C)  TempSrc: Oral  SpO2: 95%  Weight: 229 lb 8 oz (104.1 kg)  Height: 5' 6.25" (1.683 m)    Body mass index is 36.76 kg/m.  Physical Exam Vitals and nursing note reviewed.  Constitutional:      General: He is not in acute distress.    Appearance: Normal appearance. He is well-developed. He is obese. He is not ill-appearing, toxic-appearing or diaphoretic.     Interventions: Face mask in place.  HENT:     Head: Normocephalic and atraumatic.     Jaw: No trismus.     Right Ear: Hearing, tympanic membrane, ear canal and external ear normal.     Left Ear: Hearing, tympanic membrane, ear canal and external ear normal.     Nose: Mucosal edema and congestion present.     Mouth/Throat:     Mouth: Mucous membranes are moist.     Pharynx: Oropharynx is clear. Uvula midline. No pharyngeal swelling, oropharyngeal exudate, posterior oropharyngeal erythema or uvula swelling.  Eyes:      General: Lids are normal. No scleral icterus.       Right eye: No discharge.        Left eye: No discharge.     Conjunctiva/sclera: Conjunctivae normal.  Neck:     Trachea: Trachea and phonation normal. No tracheal deviation.     Comments: Chronic neck pain and stiffness Cardiovascular:     Rate and Rhythm: Normal rate and regular rhythm.     Pulses: Normal pulses.          Radial pulses are 2+ on the right side and 2+ on the left side.       Posterior tibial pulses are 2+ on the right side and 2+ on the left side.     Heart sounds: Normal heart sounds. No murmur heard.    No friction rub. No gallop.  Pulmonary:     Effort: Pulmonary effort is normal. No respiratory distress.     Breath sounds: Normal breath sounds. No stridor. No wheezing, rhonchi or rales.  Abdominal:     General: Bowel sounds are normal. There is no distension.     Palpations: Abdomen is soft.     Tenderness: There is no abdominal tenderness. There is no guarding or rebound.     Hernia: No hernia is present.  Musculoskeletal:     Cervical back: Rigidity present. Spinous process tenderness and muscular tenderness present. Decreased range of motion.  Right lower leg: No edema.     Left lower leg: No edema.  Lymphadenopathy:     Head:     Right side of head: No submental, submandibular or tonsillar adenopathy.     Left side of head: No submental, submandibular or tonsillar adenopathy.     Cervical: No cervical adenopathy.  Skin:    General: Skin is warm and dry.     Coloration: Skin is not jaundiced.     Findings: No rash.     Nails: There is no clubbing.  Neurological:     Mental Status: He is alert. Mental status is at baseline.     Cranial Nerves: No dysarthria or facial asymmetry.     Motor: No tremor or abnormal muscle tone.     Gait: Gait normal.  Psychiatric:        Mood and Affect: Mood normal.        Speech: Speech normal.        Behavior: Behavior normal. Behavior is cooperative.       Recent Results (from the past 2160 hour(s))  Basic metabolic panel     Status: Abnormal   Collection Time: 09/10/22 11:02 AM  Result Value Ref Range   Sodium 139 135 - 145 mmol/L   Potassium 4.4 3.5 - 5.1 mmol/L   Chloride 103 98 - 111 mmol/L   CO2 26 22 - 32 mmol/L   Glucose, Bld 97 70 - 99 mg/dL    Comment: Glucose reference range applies only to samples taken after fasting for at least 8 hours.   BUN 23 (H) 6 - 20 mg/dL   Creatinine, Ser 1.43 (H) 0.61 - 1.24 mg/dL   Calcium 9.9 8.9 - 10.3 mg/dL   GFR, Estimated 58 (L) >60 mL/min    Comment: (NOTE) Calculated using the CKD-EPI Creatinine Equation (2021)    Anion gap 10 5 - 15    Comment: Performed at [facility], [address] [place], [place] [address]  Pulmonary Function Test [facility] Only     Status: None   Collection Time: 10/05/22  3:54 PM  Result Value Ref Range   FVC-Pre 2.54 L   FVC-%Pred-Pre 57 %   FVC-Post 3.01 L   FVC-%Pred-Post 67 %   FVC-%Change-Post 18 %   FEV1-Pre 1.93 L   FEV1-%Pred-Pre 56 %   FEV1-Post 2.15 L   FEV1-%Pred-Post 63 %   FEV1-%Change-Post 11 %   FEV6-Pre 2.54 L   FEV6-%Pred-Pre 59 %   FEV6-Post 3.01 L   FEV6-%Pred-Post 70 %   FEV6-%Change-Post 18 %   Pre FEV1/FVC ratio 76 %   FEV1FVC-%Pred-Pre 98 %   Post FEV1/FVC ratio 71 %   FEV1FVC-%Change-Post -5 %   Pre FEV6/FVC Ratio 100 %   FEV6FVC-%Pred-Pre 104 %   Post FEV6/FVC ratio 100 %   FEV6FVC-%Pred-Post 104 %   FEV6FVC-%Change-Post 0 %   FEF 25-75 Pre 1.31 L/sec   FEF2575-%Pred-Pre 44 %   FEF 25-75 Post 2.21 L/sec   FEF2575-%Pred-Post 74 %   FEF2575-%Change-Post 68 %   RV 2.14 L   RV % pred 108 %   TLC 5.07 L   TLC % pred 79 %   DLCO unc 21.47 ml/min/mmHg   DLCO unc % pred 82 %   DL/VA 4.31 ml/min/mmHg/L   DL/VA % pred 98 %  Novel Coronavirus, NAA ([facility])     Status: None   Collection Time: 11/13/22 12:00 AM   Specimen: Nasopharyngeal(NP) swabs in vial transport  medium   Nasopharynge  Resident  Result  Value Ref Range   SARS-CoV-2, NAA Not Detected Not Detected    Comment: This nucleic acid amplification test was developed and its performance characteristics determined by [employer]. Nucleic acid amplification tests include RT-PCR and TMA. This test has not been FDA cleared or approved. This test has been authorized by FDA under an Emergency Use Authorization (EUA). This test is only authorized for the duration of time the declaration that circumstances exist justifying the authorization of the emergency use of in vitro diagnostic tests for detection of SARS-CoV-2 virus and/or diagnosis of COVID-19 infection under section 564(b)(1) of the Act, 21 U.S.C. [id number]) (1), unless the authorization is terminated or revoked sooner. When diagnostic testing is negative, the possibility of a false negative result should be considered in the context of a patient's recent exposures and the presence of clinical signs and symptoms consistent with COVID-19. An individual without symptoms of COVID-19 and who is not shedding SARS-CoV-2 virus wo uld expect to have a negative (not detected) result in this assay.   POCT rapid strep A     Status: Abnormal   Collection Time: 11/13/22  3:20 PM  Result Value Ref Range   Rapid Strep A Screen Positive (A) Negative  POCT Influenza A/B     Status: None   Collection Time: 11/13/22  3:21 PM  Result Value Ref Range   Influenza A, POC Negative Negative   Influenza B, POC Negative Negative  Basic metabolic panel     Status: Abnormal   Collection Time: 11/22/22 11:20 AM  Result Value Ref Range   Sodium 139 135 - 145 mmol/L   Potassium 3.6 3.5 - 5.1 mmol/L   Chloride 104 98 - 111 mmol/L   CO2 26 22 - 32 mmol/L   Glucose, Bld 231 (H) 70 - 99 mg/dL    Comment: Glucose reference range applies only to samples taken after fasting for at least 8 hours.   BUN 15 6 - 20 mg/dL   Creatinine, Ser 1.21 0.61 - 1.24 mg/dL   Calcium 8.5 (L) 8.9 - 10.3 mg/dL    GFR, Estimated >60 >60 mL/min    Comment: (NOTE) Calculated using the CKD-EPI Creatinine Equation (2021)    Anion gap 9 5 - 15    Comment: Performed at [facility], [address] [place], [place] [id number]  Basic metabolic panel     Status: Abnormal   Collection Time: 11/22/22  5:00 PM  Result Value Ref Range   Sodium 137 135 - 145 mmol/L   Potassium 3.5 3.5 - 5.1 mmol/L   Chloride 100 98 - 111 mmol/L   CO2 25 22 - 32 mmol/L   Glucose, Bld 124 (H) 70 - 99 mg/dL    Comment: Glucose reference range applies only to samples taken after fasting for at least 8 hours.   BUN 13 6 - 20 mg/dL   Creatinine, Ser 1.21 0.61 - 1.24 mg/dL   Calcium 8.5 (L) 8.9 - 10.3 mg/dL   GFR, Estimated >60 >60 mL/min    Comment: (NOTE) Calculated using the CKD-EPI Creatinine Equation (2021)    Anion gap 12 5 - 15    Comment: Performed at [facility], [address] [place], [place] [address]  CBC     Status: Abnormal   Collection Time: 11/22/22  5:00 PM  Result Value Ref Range   WBC 10.6 (H) 4.0 - 10.5 K/uL   RBC 5.43 4.22 - 5.81  MIL/uL   Hemoglobin 15.1 13.0 - 17.0 g/dL   HCT 45.6 39.0 - 52.0 %   MCV 84.0 80.0 - 100.0 fL   MCH 27.8 26.0 - 34.0 pg   MCHC 33.1 30.0 - 36.0 g/dL   RDW 14.1 11.5 - 15.5 %   Platelets 233 150 - 400 K/uL   nRBC 0.0 0.0 - 0.2 %    Comment: Performed at [facility], [address] [place], [place] [address]  Troponin I (High Sensitivity)     Status: Abnormal   Collection Time: 11/22/22  5:00 PM  Result Value Ref Range   Troponin I (High Sensitivity) 31 (H) <18 ng/L    Comment: (NOTE) Elevated high sensitivity troponin I (hsTnI) values and significant  changes across serial measurements may suggest ACS but many other  chronic and acute conditions are known to elevate hsTnI results.  Refer to the "Links" section for chest pain algorithms and additional  guidance. Performed at [facility], [address] [place], [place]  [address]   Brain natriuretic peptide     Status: Abnormal   Collection Time: 11/22/22  5:00 PM  Result Value Ref Range   B Natriuretic Peptide 218.3 (H) 0.0 - 100.0 pg/mL    Comment: Performed at [facility], [address] [place],  [address]  Resp panel by RT-PCR (RSV, Flu A&B, Covid) Anterior Nasal Swab     Status: None   Collection Time: 11/22/22  5:35 PM   Specimen: Anterior Nasal Swab  Result Value Ref Range   SARS Coronavirus 2 by RT PCR NEGATIVE NEGATIVE    Comment: (NOTE) SARS-CoV-2 target nucleic acids are NOT DETECTED.  The SARS-CoV-2 RNA is generally detectable in upper respiratory specimens during the acute phase of infection. The lowest concentration of SARS-CoV-2 viral copies this assay can detect is 138 copies/mL. A negative result does not preclude SARS-Cov-2 infection and should not be used as the sole basis for treatment or other patient management decisions. A negative result may occur with  improper specimen collection/handling, submission of specimen other than nasopharyngeal swab, presence of viral mutation(s) within the areas targeted by this assay, and inadequate number of viral copies(<138 copies/mL). A negative result must be combined with clinical observations, patient history, and epidemiological information. The expected result is Negative.  Fact Sheet for Patients:  EntrepreneurPulse.com.au  Fact Sheet for Healthcare Providers:  IncredibleEmployment.be  This test is no t yet approved or cleared by the [place] FDA and  has been authorized for detection and/or diagnosis of SARS-CoV-2 by FDA under an Emergency Use Authorization (EUA). This EUA will remain  in effect (meaning this test can be used) for the duration of the COVID-19 declaration under Section 564(b)(1) of the Act, 21 U.S.C.section 360bbb-3(b)(1), unless the authorization is terminated  or revoked sooner.       Influenza A by  PCR NEGATIVE NEGATIVE   Influenza B by PCR NEGATIVE NEGATIVE    Comment: (NOTE) The Xpert Xpress SARS-CoV-2/FLU/RSV plus assay is intended as an aid in the diagnosis of influenza from Nasopharyngeal swab specimens and should not be used as a sole basis for treatment. Nasal washings and aspirates are unacceptable for Xpert Xpress SARS-CoV-2/FLU/RSV testing.  Fact Sheet for Patients: EntrepreneurPulse.com.au  Fact Sheet for Healthcare Providers: IncredibleEmployment.be  This test is not yet approved or cleared by the [place] FDA and has been authorized for detection and/or diagnosis of SARS-CoV-2 by FDA under an Emergency Use Authorization (EUA). This EUA will  remain in effect (meaning this test can be used) for the duration of the COVID-19 declaration under Section 564(b)(1) of the Act, 21 U.S.C. section 360bbb-3(b)(1), unless the authorization is terminated or revoked.     Resp Syncytial Virus by PCR NEGATIVE NEGATIVE    Comment: (NOTE) Fact Sheet for Patients: EntrepreneurPulse.com.au  Fact Sheet for Healthcare Providers: IncredibleEmployment.be  This test is not yet approved or cleared by the [place] FDA and has been authorized for detection and/or diagnosis of SARS-CoV-2 by FDA under an Emergency Use Authorization (EUA). This EUA will remain in effect (meaning this test can be used) for the duration of the COVID-19 declaration under Section 564(b)(1) of the Act, 21 U.S.C. section 360bbb-3(b)(1), unless the authorization is terminated or revoked.  Performed at [facility], [address] [place], [place] [address]   CBG monitoring, ED     Status: Abnormal   Collection Time: 11/22/22  9:41 PM  Result Value Ref Range   Glucose-Capillary 145 (H) 70 - 99 mg/dL    Comment: Glucose reference range applies only to samples taken after fasting for at least 8 hours.  Troponin I (High  Sensitivity)     Status: Abnormal   Collection Time: 11/23/22  5:27 AM  Result Value Ref Range   Troponin I (High Sensitivity) 34 (H) <18 ng/L    Comment: (NOTE) Elevated high sensitivity troponin I (hsTnI) values and significant  changes across serial measurements may suggest ACS but many other  chronic and acute conditions are known to elevate hsTnI results.  Refer to the "Links" section for chest pain algorithms and additional  guidance. Performed at [facility], [address] [place], [place] [id number]   Basic metabolic panel     Status: Abnormal   Collection Time: 11/23/22  5:27 AM  Result Value Ref Range   Sodium 136 135 - 145 mmol/L   Potassium 3.7 3.5 - 5.1 mmol/L   Chloride 102 98 - 111 mmol/L   CO2 26 22 - 32 mmol/L   Glucose, Bld 124 (H) 70 - 99 mg/dL    Comment: Glucose reference range applies only to samples taken after fasting for at least 8 hours.   BUN 17 6 - 20 mg/dL   Creatinine, Ser 1.33 (H) 0.61 - 1.24 mg/dL   Calcium 8.3 (L) 8.9 - 10.3 mg/dL   GFR, Estimated >60 >60 mL/min    Comment: (NOTE) Calculated using the CKD-EPI Creatinine Equation (2021)    Anion gap 8 5 - 15    Comment: Performed at [facility], [address] [place], [place] [address]  CBG monitoring, ED     Status: Abnormal   Collection Time: 11/23/22  8:24 AM  Result Value Ref Range   Glucose-Capillary 123 (H) 70 - 99 mg/dL    Comment: Glucose reference range applies only to samples taken after fasting for at least 8 hours.  Glucose, capillary     Status: Abnormal   Collection Time: 11/23/22  1:03 PM  Result Value Ref Range   Glucose-Capillary 181 (H) 70 - 99 mg/dL    Comment: Glucose reference range applies only to samples taken after fasting for at least 8 hours.  Glucose, capillary     Status: None   Collection Time: 11/23/22  3:44 PM  Result Value Ref Range   Glucose-Capillary 83 70 - 99 mg/dL    Comment: Glucose reference range applies only to samples taken  after fasting for at least 8 hours.  Glucose,  capillary     Status: Abnormal   Collection Time: 11/23/22  9:17 PM  Result Value Ref Range   Glucose-Capillary 171 (H) 70 - 99 mg/dL    Comment: Glucose reference range applies only to samples taken after fasting for at least 8 hours.  Glucose, capillary     Status: Abnormal   Collection Time: 11/24/22  7:56 AM  Result Value Ref Range   Glucose-Capillary 139 (H) 70 - 99 mg/dL    Comment: Glucose reference range applies only to samples taken after fasting for at least 8 hours.  Basic metabolic panel     Status: Abnormal   Collection Time: 11/27/22  3:08 PM  Result Value Ref Range   Sodium 138 135 - 145 mmol/L   Potassium 3.8 3.5 - 5.1 mmol/L   Chloride 99 98 - 111 mmol/L   CO2 25 22 - 32 mmol/L   Glucose, Bld 127 (H) 70 - 99 mg/dL    Comment: Glucose reference range applies only to samples taken after fasting for at least 8 hours.   BUN 26 (H) 6 - 20 mg/dL   Creatinine, Ser 1.50 (H) 0.61 - 1.24 mg/dL   Calcium 9.2 8.9 - 10.3 mg/dL   GFR, Estimated 55 (L) >60 mL/min    Comment: (NOTE) Calculated using the CKD-EPI Creatinine Equation (2021)    Anion gap 14 5 - 15    Comment: Performed at [facility], [address] [place], [place] [address]    Fall Risk:    12/06/2022    8:49 AM 11/13/2022    2:58 PM 09/20/2022    2:39 PM 08/28/2022   10:43 AM 07/23/2022    1:45 PM  Fall Risk   Falls in the past year? 1 1 1 1 1  $ Number falls in past yr: 0 1 1 0 0  Injury with Fall? 1 0 0 1 1  Risk for fall due to : Impaired balance/gait Impaired balance/gait History of fall(s) Impaired balance/gait Impaired mobility;Impaired balance/gait  Follow up Falls prevention discussed;Education provided;Falls evaluation completed Falls prevention discussed;Education provided;Falls evaluation completed Falls evaluation completed Falls prevention discussed;Education provided;Falls evaluation completed Education provided;Falls prevention  discussed;Falls evaluation completed    Functional Status Survey: Is the patient deaf or have difficulty hearing?: Yes Does the patient have difficulty seeing, even when wearing glasses/contacts?: Yes Does the patient have difficulty concentrating, remembering, or making decisions?: Yes Does the patient have difficulty walking or climbing stairs?: Yes Does the patient have difficulty dressing or bathing?: Yes Does the patient have difficulty doing errands alone such as visiting a doctor's office or shopping?: No   Assessment & Plan:    CPE completed today  Prostate cancer screening and PSA options (with potential risks and benefits of testing vs not testing) were discussed along with recent recs/guidelines, shared decision making and handout/information given to pt today  USPSTF grade A and B recommendations reviewed with patient; age-appropriate recommendations, preventive care, screening tests, etc discussed and encouraged; healthy living encouraged; see AVS for patient education given to patient  Discussed importance of 150 minutes of physical activity weekly, AHA exercise recommendations given to pt in AVS/handout  Discussed importance of healthy diet:  eating lean meats and proteins, avoiding trans fats and saturated fats, avoid simple sugars and excessive carbs in diet, eat 6 servings of fruit/vegetables daily and drink plenty of water and avoid sweet beverages.  DASH diet reviewed if pt has HTN  Recommended pt to do annual eye exam and routine  dental exams/cleanings  Advance Care planning information and packet discussed and offered today, encouraged pt to discuss with family members/spouse/partner/friends and complete Advanced directive packet and bring copy to office   Reviewed Health Maintenance: Health Maintenance  Topic Date Due   Zoster Vaccines- Shingrix (1 of 2) Never done   COVID-19 Vaccine (4 - 2023-24 season) 12/22/2022 (Originally 06/15/2022)   OPHTHALMOLOGY EXAM   02/06/2023   HEMOGLOBIN A1C  02/21/2023   COLONOSCOPY (Pts 45-3yr Insurance coverage will need to be confirmed)  03/11/2023   Diabetic kidney evaluation - Urine ACR  06/28/2023   FOOT EXAM  08/10/2023   Lung Cancer Screening  08/25/2023   Diabetic kidney evaluation - eGFR measurement  11/28/2023   DTaP/Tdap/Td (2 - Td or Tdap) 04/14/2029   INFLUENZA VACCINE  Completed   Hepatitis C Screening  Completed   HIV Screening  Completed   HPV VACCINES  Aged Out    Immunizations: Immunization History  Administered Date(s) Administered   Influenza,inj,Quad PF,6+ Mos 07/23/2019, 07/01/2020, 08/18/2021   Influenza-Unspecified 07/24/2022   PFIZER(Purple Top)SARS-COV-2 Vaccination 12/26/2019, 01/19/2020, 07/13/2020   PNEUMOCOCCAL CONJUGATE-20 08/18/2021   Pneumococcal Polysaccharide-23 04/15/2019   Tdap 04/15/2019   Vaccines:  HPV: up to at age 56, ask insurance if age between 261-45 Shingrix: 547-64yo and ask insurance if covered when patient above 630yo - did shingrix at grocery store he is going to bring in the record Pneumonia: DONE -  educated and discussed with patient. Flu: UTD educated and discussed with patient. COVID:  did initial and one booster     ICD-10-CM   1. Annual physical exam  [id number] METABOLIC PANEL WITH GFR    CBC with Differential/Platelet    Lipid panel    PSA    Hemoglobin A1c    2. Screening for prostate cancer  Z12.5 PSA      BP elevated today - he has some white coat htn esp in our office, last BP was at goal 128/76 - he is managed by cardiology and now has home BP cuff - he also took some cold/cough meds otc that may be increasing BP - he was instructed to avoid OTC cold/cough meds and NSAIDs and I have prescribed him multiple allergy meds - instructed to take those since they will address sx but not affect HR/BP BP Readings from Last 3 Encounters:  12/06/22 (!) 170/84  11/27/22 128/76  11/24/22 115/70   Needs routine F/up in 3 months     LDelsa Grana PA-C 12/06/22 9:20 AM  [email or web address]

## 2022-12-07 LAB — CBC WITH DIFFERENTIAL/PLATELET
Absolute Monocytes: 281 cells/uL (ref 200–950)
Basophils Absolute: 43 cells/uL (ref 0–200)
Basophils Relative: 0.6 %
Eosinophils Absolute: 144 cells/uL (ref 15–500)
Eosinophils Relative: 2 %
HCT: 47 % (ref 38.5–50.0)
Hemoglobin: 15.6 g/dL (ref 13.2–17.1)
Lymphs Abs: 1606 cells/uL (ref 850–3900)
MCH: 27.8 pg (ref 27.0–33.0)
MCHC: 33.2 g/dL (ref 32.0–36.0)
MCV: 83.8 fL (ref 80.0–100.0)
MPV: 10.1 fL (ref 7.5–12.5)
Monocytes Relative: 3.9 %
Neutro Abs: 5126 cells/uL (ref 1500–7800)
Neutrophils Relative %: 71.2 %
Platelets: 259 10*3/uL (ref 140–400)
RBC: 5.61 10*6/uL (ref 4.20–5.80)
RDW: 14 % (ref 11.0–15.0)
Total Lymphocyte: 22.3 %
WBC: 7.2 10*3/uL (ref 3.8–10.8)

## 2022-12-07 LAB — COMPLETE METABOLIC PANEL WITH GFR
AG Ratio: 1.2 (calc) (ref 1.0–2.5)
ALT: 13 U/L (ref 9–46)
AST: 12 U/L (ref 10–35)
Albumin: 3.9 g/dL (ref 3.6–5.1)
Alkaline phosphatase (APISO): 70 U/L (ref 35–144)
BUN: 16 mg/dL (ref 7–25)
CO2: 23 mmol/L (ref 20–32)
Calcium: 9.2 mg/dL (ref 8.6–10.3)
Chloride: 103 mmol/L (ref 98–110)
Creat: 1.13 mg/dL (ref 0.70–1.30)
Globulin: 3.2 g/dL (calc) (ref 1.9–3.7)
Glucose, Bld: 293 mg/dL — ABNORMAL HIGH (ref 65–99)
Potassium: 4.2 mmol/L (ref 3.5–5.3)
Sodium: 138 mmol/L (ref 135–146)
Total Bilirubin: 0.3 mg/dL (ref 0.2–1.2)
Total Protein: 7.1 g/dL (ref 6.1–8.1)
eGFR: 77 mL/min/{1.73_m2} (ref >=60)

## 2022-12-07 LAB — HEMOGLOBIN A1C
Hgb A1c MFr Bld: 6.9 % of total Hgb — ABNORMAL HIGH (ref <5.7)
Mean Plasma Glucose: 151 mg/dL
eAG (mmol/L): 8.4 mmol/L

## 2022-12-07 LAB — LIPID PANEL
Cholesterol: 223 mg/dL — ABNORMAL HIGH (ref <200)
HDL: 43 mg/dL (ref >=40)
LDL Cholesterol (Calc): 128 mg/dL (calc) — ABNORMAL HIGH
Non-HDL Cholesterol (Calc): 180 mg/dL (calc) — ABNORMAL HIGH (ref <130)
Total CHOL/HDL Ratio: 5.2 (calc) — ABNORMAL HIGH (ref <5.0)
Triglycerides: 365 mg/dL — ABNORMAL HIGH (ref <150)

## 2022-12-07 LAB — PSA: PSA: 2.02 ng/mL (ref <=4.00)

## 2022-12-10 ENCOUNTER — Encounter: Payer: Self-pay | Admitting: Student in an Organized Health Care Education/Training Program

## 2022-12-10 ENCOUNTER — Ambulatory Visit (INDEPENDENT_AMBULATORY_CARE_PROVIDER_SITE_OTHER): Payer: Medicaid Other | Admitting: Student in an Organized Health Care Education/Training Program

## 2022-12-10 VITALS — BP 150/82 | HR 66 | Temp 97.6°F | Ht 66.0 in | Wt 232.0 lb

## 2022-12-10 DIAGNOSIS — R0602 Shortness of breath: Secondary | ICD-10-CM

## 2022-12-10 DIAGNOSIS — Z87891 Personal history of nicotine dependence: Secondary | ICD-10-CM | POA: Diagnosis not present

## 2022-12-10 DIAGNOSIS — J4530 Mild persistent asthma, uncomplicated: Secondary | ICD-10-CM

## 2022-12-10 DIAGNOSIS — G4733 Obstructive sleep apnea (adult) (pediatric): Secondary | ICD-10-CM

## 2022-12-10 DIAGNOSIS — J398 Other specified diseases of upper respiratory tract: Secondary | ICD-10-CM | POA: Diagnosis not present

## 2022-12-10 NOTE — Progress Notes (Signed)
Synopsis: Referred in for shortness of breath by Cory Grana, PA-C  Assessment & Plan:   #Shortness of breath #Obstructive sleep apnea syndrome #Mild persistent asthma without complication #Tracheobronchomalacia  Cory Rivas has a history of childhood asthma and is presenting for episodic shortness of breath, wheezing, and cough. His symptoms seem to have somewhat improved with the initiation of Symbicot. PFT's show reversible obstruction with mild restriction. During his last visit, we added Spiriva to his regimen for ICS/LABA/LAMA given his smoking history.   My differential for Cory Rivas's symptoms is broad. He has a history of childhood asthma and could have been having asthma exacerbations; PFT's confirm reversible obstruction. Furthermore, he was recently admitted and treated for decompensated HFpEF requiring BiPAP and diuresis. Other obstructive diseases on the differential include COPD as well as a COPD/Asthma overlap syndrome. Furthermore, review of his chest CT is notable for significant narrowing of the left mainstem bronchus and is suggestive of tracheobronchomalacia.  PFT's are consistent with Asthma, and he is managed with LABA/ICS and I had added LABA to his regimen during our last visit. Furthermore, he has underwent a home sleep study with an AHI of 8.8. Given his history of heart failure as well as the tracheobronchomalacia, I will prescribe him a CPAP machine to help with his symptoms. I will consider a referral to a center with IP for possible rigid bronchoscopy with silicone Y-stent placement for assessment should he not have any improvement of symptoms in the future.   -continue LAMA (Spiriva) AND LABA/ICS (Symbicort) -continue PRN SABA (albuterol) -initiate CPAP, auto-titrate 6 cm - 20 cm H2O -continue levocetirizine  #Former heavy tobacco smoker  Continue nicotine replacement therapy   Return in about 4 months (around 04/10/2023).  I spent 30 minutes caring for  this patient today, including preparing to see the patient, obtaining a medical history , reviewing a separately obtained history, performing a medically appropriate examination and/or evaluation, counseling and educating the patient/family/caregiver, ordering medications, tests, or procedures, documenting clinical information in the electronic health record, and independently interpreting results (not separately reported/billed) and communicating results to the patient/family/caregiver  Cory Reichert, MD [facility] 12/10/2022 9:22 AM    End of visit medications:  No orders of the defined types were placed in this encounter.    Current Outpatient Medications:    allopurinol (ZYLOPRIM) 100 MG tablet, TAKE 1 TABLET BY MOUTH ONCE A DAY. TAKE WITH 300 MG TABLETS, Disp: 90 tablet, Rfl: 1   allopurinol (ZYLOPRIM) 300 MG tablet, Take one 300 mg tab po daily WITH 100 mg tab for total of 400 mg once daily for gout, Disp: 90 tablet, Rfl: 1   benzonatate (TESSALON) 200 MG capsule, Take 1 capsule (200 mg total) by mouth 2 (two) times daily as needed for cough., Disp: 20 capsule, Rfl: 0   DULoxetine (CYMBALTA) 30 MG capsule, TAKE 1 CAPSULE BY MOUTH 2 TIMES DAILY, Disp: 180 capsule, Rfl: 1   empagliflozin (JARDIANCE) 10 MG TABS tablet, Take 1 tablet (10 mg total) by mouth daily before breakfast. Bringing voucher, Disp: 14 tablet, Rfl: 3   fluticasone (FLONASE) 50 MCG/ACT nasal spray, Place 1 spray into both nostrils daily., Disp: 18.2 mL, Rfl: 6   furosemide (LASIX) 40 MG tablet, Take 1 tablet (40 mg total) by mouth daily. TAKE 1 TABLET BY MOUTH DAILY. TAKE WITH POTASSIUM., Disp: 30 tablet, Rfl: 0   indomethacin (INDOCIN) 25 MG capsule, TAKE 2 TABLETS (50 MG) BY 3 TIMES DAILY FOR 2 DAYS AT  ONSET OF GOUT FLARE UP, THEN TAKE 1('25MG'$ )TABLET BY MOUTH FOR 3 DAYS, Disp: 20 capsule, Rfl: 3   insulin glargine (LANTUS SOLOSTAR) 100 UNIT/ML Solostar Pen, Inject 25 Units into the skin at bedtime.,  Disp: , Rfl:    insulin lispro (HUMALOG) 100 UNIT/ML KwikPen, Inject 1-45 Units into the skin 3 (three) times daily. Sliding scale up to 45 units per injection, Disp: , Rfl:    ipratropium-albuterol (DUONEB) 0.5-2.5 (3) MG/3ML SOLN, Take 3 mLs by nebulization every 6 (six) hours as needed (SOB wheeze cough)., Disp: 180 mL, Rfl: 1   levocetirizine (XYZAL) 5 MG tablet, Take 1 tablet (5 mg total) by mouth daily., Disp: 30 tablet, Rfl: 11   metFORMIN (GLUCOPHAGE) 1000 MG tablet, TAKE 1 TABLET BY MOUTH TWICE A DAY WITH A MEAL, Disp: 180 tablet, Rfl: 3   nicotine polacrilex (NICOTINE MINI) 2 MG lozenge, Take 1 lozenge (2 mg total) by mouth every 2 (two) hours as needed for smoking cessation., Disp: 72 lozenge, Rfl: 6   Olopatadine HCl 0.2 % SOLN, Apply 1 drop to eye 2 (two) times daily as needed (eye allergies)., Disp: 2.5 mL, Rfl: 3   potassium chloride SA (KLOR-CON M) 20 MEQ tablet, Take 1 tablet (20 mEq total) by mouth daily. Take with furosemide, Disp: 30 tablet, Rfl: 3   pregabalin (LYRICA) 25 MG capsule, TAKE 1 CAPSULE BY MOUTH IN THE MORNING AND 1 TO 3 CAPSULES EVERY NIGHT FOR CERVICAL RADICULOPATHY AND NEUROPATHY, Disp: 270 capsule, Rfl: 1   rosuvastatin (CRESTOR) 40 MG tablet, Take 1 tablet (40 mg total) by mouth daily., Disp: 90 tablet, Rfl: 3   Tiotropium Bromide Monohydrate (SPIRIVA RESPIMAT) 2.5 MCG/ACT AERS, Inhale 2 puffs into the lungs daily., Disp: 30 each, Rfl: 11   TRULICITY 3 0000000 SOPN, Inject into the skin., Disp: , Rfl:    VENTOLIN HFA 108 (90 Base) MCG/ACT inhaler, INHALE 2 PUFFS INTO THE LUNGS EVERY 6 HOURS AS NEEDED FOR WHEEZING OR SHORTNESS OF BREATH, Disp: 18 g, Rfl: 2 No current facility-administered medications for this visit.  Facility-Administered Medications Ordered in Other Visits:    albuterol (PROVENTIL) (2.5 MG/3ML) 0.083% nebulizer solution 2.5 mg, 2.5 mg, Nebulization, Once, Cory Reichert, MD   Subjective:   PATIENT ID: Cory Rivas GENDER: male DOB:  01/02/1966, MRN: [id number]  Chief Complaint  Patient presents with   Follow-up    Review HST-     HPI  Cory Rivas is a pleasant 57 year old male patient presenting to clinic for follow up on shortness of breath.   Patient reports symptoms of increased shortness of breath over the past year that is associated with wheezing, cough, and sputum production.  He reports that for the past year, he started noticing the symptoms that have progressed prompting him to present to the emergency department multiple times for an evaluation. He was most recently admitted in February of 2024 for decompensated heart failure.  During prior presentations to the ED, he was worked up and ruled out for pulmonary embolism and was told he has bronchitis. He was given prednisone in June and was recently started on Symbicort by his primary care provider. He was also admitted for 2 days in early November where he was managed for decompensated heart failure and possible asthma exacerbation.  He continues to abstain from cigarettes, and is here today to discuss his sleep study. He is using the nicotine lozenges that have helped with his smoking cessation. He is using the Symbicort and Spiriva which are helping  with his symptoms. He tells me he snores at night and he has been having nasal congestion.   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 He had to undergo cervical fusion at [facility] after which he has been on disability.  Ancillary information including prior medications, full medical/surgical/family/social histories, and PFTs (when available) are listed below and have been reviewed.   Review of Systems  Constitutional:  Negative for weight loss.  Respiratory:  Positive for shortness of breath. Negative for cough, hemoptysis, sputum production and wheezing.   Cardiovascular:  Negative for chest pain.     Objective:   Vitals:   12/10/22 0843  BP: (!) 150/82  Pulse: 66  Temp: 97.6 F  (36.4 C)  TempSrc: Temporal  SpO2: 97%  Weight: 232 lb (105.2 kg)  Height: '5\' 6"'$  (1.676 m)   97% on RA  BMI Readings from Last 3 Encounters:  12/10/22 37.45 kg/m  12/06/22 36.76 kg/m  11/27/22 35.40 kg/m   Wt Readings from Last 3 Encounters:  12/10/22 232 lb (105.2 kg)  12/06/22 229 lb 8 oz (104.1 kg)  11/27/22 226 lb (102.5 kg)    Physical Exam Constitutional:      Appearance: He is obese. He is not ill-appearing.  HENT:     Nose: No congestion or rhinorrhea.     Mouth/Throat:     Mouth: Mucous membranes are moist.  Eyes:     Pupils: Pupils are equal, round, and reactive to light.  Cardiovascular:     Rate and Rhythm: Normal rate and regular rhythm.     Heart sounds: Normal heart sounds.  Pulmonary:     Effort: Pulmonary effort is normal.     Breath sounds: Normal breath sounds. No wheezing, rhonchi or rales.  Abdominal:     General: There is distension.     Palpations: Abdomen is soft.  Musculoskeletal:        General: Normal range of motion.  Neurological:     General: No focal deficit present.     Mental Status: He is alert and oriented to person, place, and time. Mental status is at baseline.       Ancillary Information    Past Medical History:  Diagnosis Date   Bulging of cervical intervertebral disc    CHF (congestive heart failure) (HCC)    Diabetes mellitus without complication (HCC)    Gout    High cholesterol    Hypertension    Stress due to family tension 04/15/2019     Family History  Problem Relation Age of Onset   Heart disease Mother    Diabetes Mother    Hyperlipidemia Mother    Hypertension Mother    Heart attack Mother    Lung cancer Father    Hypertension Father    Stroke Father    AAA (abdominal aortic aneurysm) Maternal Grandmother    Heart attack Maternal Grandmother    Diabetes Maternal Grandfather      Past Surgical History:  Procedure Laterality Date   CHOLECYSTECTOMY     COLONOSCOPY WITH PROPOFOL N/A  03/10/2020   Procedure: COLONOSCOPY WITH PROPOFOL;  Surgeon: Lucilla Lame, MD;  Location: [facility] ENDOSCOPY;  Service: Endoscopy;  Laterality: N/A;    Social History   Socioeconomic History   Marital status: Divorced    Spouse name: Not on file   Number of children: 0   Years of education: 9   Highest education level: 9th grade  Occupational History   Occupation:  disability  Tobacco Use   Smoking status: Former    Packs/day: 1.00    Years: 37.00    Total pack years: 37.00    Types: Cigarettes    Start date: 10/19/1989    Quit date: 08/20/2022    Years since quitting: 0.3   Smokeless tobacco: Never   Tobacco comments:    30+ years hx smoking 2 ppd, stopped smoking briefly in April 2022 for a surgery and then restarted smoking about 1/2 ppd last 4-5 months     Quit smoking 08/20/2022  Vaping Use   Vaping Use: Never used  Substance and Sexual Activity   Alcohol use: No   Drug use: No   Sexual activity: Not Currently    Partners: Female  Other Topics Concern   Not on file  Social History Narrative   Lives with sister Abigail Butts, brother in law Linna Hoff), and his mother - Violet Boivin   Social Determinants of Health   Financial Resource Strain: Medium Risk (12/06/2022)   Overall Financial Resource Strain (CARDIA)    Difficulty of Paying Living Expenses: Somewhat hard  Food Insecurity: Food Insecurity Present (12/06/2022)   Hunger Vital Sign    Worried About [place] in the Last Year: Often true    Ran Out of Food in the Last Year: Often true  Transportation Needs: No Transportation Needs (12/06/2022)   PRAPARE - Hydrologist (Medical): No    Lack of Transportation (Non-Medical): No  Physical Activity: Insufficiently Active (12/06/2022)   Exercise Vital Sign    Days of Exercise per Week: 3 days    Minutes of Exercise per Session: 10 min  Stress: No Stress Concern Present (12/06/2022)   [place]    Feeling of Stress : Only a little  Social Connections: Socially Isolated (12/06/2022)   Social Connection and Isolation Panel [NHANES]    Frequency of Communication with Friends and Family: More than three times a week    Frequency of Social Gatherings with Friends and Family: Twice a week    Attends Religious Services: Never    Marine scientist or Organizations: No    Attends Archivist Meetings: Never    Marital Status: Divorced  Human resources officer Violence: Not At Risk (12/06/2022)   Humiliation, Afraid, Rape, and Kick questionnaire    Fear of Current or Ex-Partner: No    Emotionally Abused: No    Physically Abused: No    Sexually Abused: No     Allergies  Allergen Reactions   Oxycodone Nausea And Vomiting   Onion Nausea And Vomiting     CBC    Component Value Date/Time   WBC 7.2 12/06/2022 0954   RBC 5.61 12/06/2022 0954   HGB 15.6 12/06/2022 0954   HGB 14.1 08/01/2022 1057   HCT 47.0 12/06/2022 0954   HCT 42.1 08/01/2022 1057   PLT 259 12/06/2022 0954   PLT 221 08/01/2022 1057   MCV 83.8 12/06/2022 0954   MCV 85 08/01/2022 1057   MCH 27.8 12/06/2022 0954   MCHC 33.2 12/06/2022 0954   RDW 14.0 12/06/2022 0954   RDW 13.6 08/01/2022 1057   LYMPHSABS 1,606 12/06/2022 0954   LYMPHSABS 2.1 08/01/2022 1057   MONOABS 0.6 08/23/2022 0534   EOSABS 144 12/06/2022 0954   EOSABS 0.1 08/01/2022 1057   BASOSABS 43 12/06/2022 0954   BASOSABS 0.0 08/01/2022 1057    Pulmonary Functions Testing Results:  Latest Ref Rng & Units 10/05/2022    3:54 PM  PFT Results  FVC-Pre L 2.54   FVC-Predicted Pre % 57   FVC-Post L 3.01   FVC-Predicted Post % 67   Pre FEV1/FVC % % 76   Post FEV1/FCV % % 71   FEV1-Pre L 1.93   FEV1-Predicted Pre % 56   FEV1-Post L 2.15   DLCO uncorrected ml/min/mmHg 21.47   DLCO UNC% % 82   DLVA Predicted % 98   TLC L 5.07   TLC % Predicted % 79   RV % Predicted % 108     Outpatient Medications Prior to Visit   Medication Sig Dispense Refill   allopurinol (ZYLOPRIM) 100 MG tablet TAKE 1 TABLET BY MOUTH ONCE A DAY. TAKE WITH 300 MG TABLETS 90 tablet 1   allopurinol (ZYLOPRIM) 300 MG tablet Take one 300 mg tab po daily WITH 100 mg tab for total of 400 mg once daily for gout 90 tablet 1   benzonatate (TESSALON) 200 MG capsule Take 1 capsule (200 mg total) by mouth 2 (two) times daily as needed for cough. 20 capsule 0   DULoxetine (CYMBALTA) 30 MG capsule TAKE 1 CAPSULE BY MOUTH 2 TIMES DAILY 180 capsule 1   empagliflozin (JARDIANCE) 10 MG TABS tablet Take 1 tablet (10 mg total) by mouth daily before breakfast. Bringing voucher 14 tablet 3   fluticasone (FLONASE) 50 MCG/ACT nasal spray Place 1 spray into both nostrils daily. 18.2 mL 6   furosemide (LASIX) 40 MG tablet Take 1 tablet (40 mg total) by mouth daily. TAKE 1 TABLET BY MOUTH DAILY. TAKE WITH POTASSIUM. 30 tablet 0   indomethacin (INDOCIN) 25 MG capsule TAKE 2 TABLETS (50 MG) BY 3 TIMES DAILY FOR 2 DAYS AT ONSET OF GOUT FLARE UP, THEN TAKE 1('25MG'$ )TABLET BY MOUTH FOR 3 DAYS 20 capsule 3   insulin glargine (LANTUS SOLOSTAR) 100 UNIT/ML Solostar Pen Inject 25 Units into the skin at bedtime.     insulin lispro (HUMALOG) 100 UNIT/ML KwikPen Inject 1-45 Units into the skin 3 (three) times daily. Sliding scale up to 45 units per injection     ipratropium-albuterol (DUONEB) 0.5-2.5 (3) MG/3ML SOLN Take 3 mLs by nebulization every 6 (six) hours as needed (SOB wheeze cough). 180 mL 1   levocetirizine (XYZAL) 5 MG tablet Take 1 tablet (5 mg total) by mouth daily. 30 tablet 11   metFORMIN (GLUCOPHAGE) 1000 MG tablet TAKE 1 TABLET BY MOUTH TWICE A DAY WITH A MEAL 180 tablet 3   nicotine polacrilex (NICOTINE MINI) 2 MG lozenge Take 1 lozenge (2 mg total) by mouth every 2 (two) hours as needed for smoking cessation. 72 lozenge 6   Olopatadine HCl 0.2 % SOLN Apply 1 drop to eye 2 (two) times daily as needed (eye allergies). 2.5 mL 3   potassium chloride SA (KLOR-CON  M) 20 MEQ tablet Take 1 tablet (20 mEq total) by mouth daily. Take with furosemide 30 tablet 3   pregabalin (LYRICA) 25 MG capsule TAKE 1 CAPSULE BY MOUTH IN THE MORNING AND 1 TO 3 CAPSULES EVERY NIGHT FOR CERVICAL RADICULOPATHY AND NEUROPATHY 270 capsule 1   rosuvastatin (CRESTOR) 40 MG tablet Take 1 tablet (40 mg total) by mouth daily. 90 tablet 3   Tiotropium Bromide Monohydrate (SPIRIVA RESPIMAT) 2.5 MCG/ACT AERS Inhale 2 puffs into the lungs daily. 30 each 11   TRULICITY 3 0000000 SOPN Inject into the skin.     VENTOLIN HFA 108 (90 Base) MCG/ACT  inhaler INHALE 2 PUFFS INTO THE LUNGS EVERY 6 HOURS AS NEEDED FOR WHEEZING OR SHORTNESS OF BREATH 18 g 2   Facility-Administered Medications Prior to Visit  Medication Dose Route Frequency Provider Last Rate Last Admin   albuterol (PROVENTIL) (2.5 MG/3ML) 0.083% nebulizer solution 2.5 mg  2.5 mg Nebulization Once Cory Reichert, MD

## 2022-12-10 NOTE — Patient Instructions (Addendum)
Please continue using your Symbicort (two puffs twice a daily, wash your mouth afterwards) and Spiriva (two puffs once a day). Today I wrote you a prescription for a CPAP machine to help with your sleep apnea; this will also help with your tracheobronchomalacia. Continue to abstain from smoking. I would like to see you a few months after you have initiated the CPAP machine.

## 2022-12-21 ENCOUNTER — Encounter: Payer: Self-pay | Admitting: Family Medicine

## 2022-12-21 ENCOUNTER — Ambulatory Visit: Payer: Medicaid Other | Admitting: Family Medicine

## 2022-12-21 VITALS — BP 162/86 | HR 78 | Temp 98.1°F | Resp 16 | Ht 66.0 in | Wt 232.3 lb

## 2022-12-21 DIAGNOSIS — M546 Pain in thoracic spine: Secondary | ICD-10-CM

## 2022-12-21 MED ORDER — BACLOFEN 5 MG PO TABS: 5.0000 mg | ORAL_TABLET | Freq: Three times a day (TID) | ORAL | 1 refills | Status: DC | PRN

## 2022-12-21 MED ORDER — PREDNISONE 20 MG PO TABS: 40.0000 mg | ORAL_TABLET | Freq: Every day | ORAL | 0 refills | Status: AC

## 2022-12-21 NOTE — Patient Instructions (Signed)
Take tylenol and no NSAIDs Take the steroids for the next 5 days  You can use the muscle relaxers if needed Apply heat or ice  Call me in about 5 days and let me know if your pain is not better and we'll get you to a specialist for more evalutation

## 2022-12-21 NOTE — Progress Notes (Unsigned)
Patient ID: Cory Rivas, male    DOB: November 19, 1965, 57 y.o.   MRN: [id number]  PCP: Delsa Grana, PA-C  Chief Complaint  Patient presents with   Back Pain    Has been doing outside work onset pain since Sun    Subjective:   Cory Rivas is a 57 y.o. male, presents to clinic with CC of the following:  HPI Back pain gradual onset after a lot of strenuous physical work over the weekend.  Hurts in mid to low back, he was raking rocks/gravel and doing a lot of twisting and pushing/pulling    Patient Active Problem List   Diagnosis Date Noted   Acute decompensated heart failure ([place]) 11/23/2022   Hypertensive emergency 11/22/2022   Acute respiratory failure with hypoxia ([place]) 11/22/2022   Elevated troponin 11/22/2022   CHF exacerbation ([place]) 08/24/2022   Acute on chronic diastolic congestive heart failure ([place]) 08/23/2022   Class 2 obesity 08/23/2022   Type 2 diabetes mellitus with hyperglycemia ([place]) 08/23/2022   Grade I diastolic dysfunction [id number]   LVH (left ventricular hypertrophy) 06/08/2022   Accelerated hypertension 06/08/2022   S/P cervical spinal fusion 08/18/2021   Former heavy tobacco smoker 08/18/2021   Gout 08/18/2021   Other chronic pain [id number]   Systolic murmur [id number]   Tobacco use 10/26/2020   Polyp of ascending colon    Rectal polyp    Class 2 severe obesity with serious comorbidity and body mass index (BMI) of 39.0 to 39.9 in adult [facility]) 04/15/2019   Chronic kidney disease (CKD) stage G2/A3, mildly decreased glomerular filtration rate (GFR) between 60-89 mL/min/1.73 square meter and albuminuria creatinine ratio greater than 300 mg/g 04/15/2019   Proteinuria 04/15/2019   Hyperlipidemia associated with type 2 diabetes mellitus ([place]) 03/18/2019   Diabetes mellitus ([place]) 03/18/2019   Idiopathic chronic gout of multiple sites without tophus 03/18/2019   Essential hypertension 03/18/2019   Dermatitis 03/18/2019   Lumbar degenerative disc  disease 11/22/2015   Cervical myelopathy ([place]) 11/22/2015   Osteoarthritis of spine with radiculopathy, lumbar region 11/22/2015      Current Outpatient Medications:    allopurinol (ZYLOPRIM) 100 MG tablet, TAKE 1 TABLET BY MOUTH ONCE A DAY. TAKE WITH 300 MG TABLETS, Disp: 90 tablet, Rfl: 1   allopurinol (ZYLOPRIM) 300 MG tablet, Take one 300 mg tab po daily WITH 100 mg tab for total of 400 mg once daily for gout, Disp: 90 tablet, Rfl: 1   benzonatate (TESSALON) 200 MG capsule, Take 1 capsule (200 mg total) by mouth 2 (two) times daily as needed for cough., Disp: 20 capsule, Rfl: 0   DULoxetine (CYMBALTA) 30 MG capsule, TAKE 1 CAPSULE BY MOUTH 2 TIMES DAILY, Disp: 180 capsule, Rfl: 1   empagliflozin (JARDIANCE) 10 MG TABS tablet, Take 1 tablet (10 mg total) by mouth daily before breakfast. Bringing voucher, Disp: 14 tablet, Rfl: 3   fluticasone (FLONASE) 50 MCG/ACT nasal spray, Place 1 spray into both nostrils daily., Disp: 18.2 mL, Rfl: 6   furosemide (LASIX) 40 MG tablet, Take 1 tablet (40 mg total) by mouth daily. TAKE 1 TABLET BY MOUTH DAILY. TAKE WITH POTASSIUM., Disp: 30 tablet, Rfl: 0   indomethacin (INDOCIN) 25 MG capsule, TAKE 2 TABLETS (50 MG) BY 3 TIMES DAILY FOR 2 DAYS AT ONSET OF GOUT FLARE UP, THEN TAKE 1('25MG'$ )TABLET BY MOUTH FOR 3 DAYS, Disp: 20 capsule, Rfl: 3   insulin glargine (LANTUS SOLOSTAR) 100 UNIT/ML Solostar Pen, Inject 25 Units into the skin  at bedtime., Disp: , Rfl:    insulin lispro (HUMALOG) 100 UNIT/ML KwikPen, Inject 1-45 Units into the skin 3 (three) times daily. Sliding scale up to 45 units per injection, Disp: , Rfl:    ipratropium-albuterol (DUONEB) 0.5-2.5 (3) MG/3ML SOLN, Take 3 mLs by nebulization every 6 (six) hours as needed (SOB wheeze cough)., Disp: 180 mL, Rfl: 1   levocetirizine (XYZAL) 5 MG tablet, Take 1 tablet (5 mg total) by mouth daily., Disp: 30 tablet, Rfl: 11   metFORMIN (GLUCOPHAGE) 1000 MG tablet, TAKE 1 TABLET BY MOUTH TWICE A DAY WITH A  MEAL, Disp: 180 tablet, Rfl: 3   nicotine polacrilex (NICOTINE MINI) 2 MG lozenge, Take 1 lozenge (2 mg total) by mouth every 2 (two) hours as needed for smoking cessation., Disp: 72 lozenge, Rfl: 6   Olopatadine HCl 0.2 % SOLN, Apply 1 drop to eye 2 (two) times daily as needed (eye allergies)., Disp: 2.5 mL, Rfl: 3   potassium chloride SA (KLOR-CON M) 20 MEQ tablet, Take 1 tablet (20 mEq total) by mouth daily. Take with furosemide, Disp: 30 tablet, Rfl: 3   pregabalin (LYRICA) 25 MG capsule, TAKE 1 CAPSULE BY MOUTH IN THE MORNING AND 1 TO 3 CAPSULES EVERY NIGHT FOR CERVICAL RADICULOPATHY AND NEUROPATHY, Disp: 270 capsule, Rfl: 1   rosuvastatin (CRESTOR) 40 MG tablet, Take 1 tablet (40 mg total) by mouth daily., Disp: 90 tablet, Rfl: 3   Tiotropium Bromide Monohydrate (SPIRIVA RESPIMAT) 2.5 MCG/ACT AERS, Inhale 2 puffs into the lungs daily., Disp: 30 each, Rfl: 11   TRULICITY 3 0000000 SOPN, Inject into the skin., Disp: , Rfl:    VENTOLIN HFA 108 (90 Base) MCG/ACT inhaler, INHALE 2 PUFFS INTO THE LUNGS EVERY 6 HOURS AS NEEDED FOR WHEEZING OR SHORTNESS OF BREATH, Disp: 18 g, Rfl: 2 No current facility-administered medications for this visit.  Facility-Administered Medications Ordered in Other Visits:    albuterol (PROVENTIL) (2.5 MG/3ML) 0.083% nebulizer solution 2.5 mg, 2.5 mg, Nebulization, Once, Armando Reichert, MD   Allergies  Allergen Reactions   Oxycodone Nausea And Vomiting   Onion Nausea And Vomiting     Social History   Tobacco Use   Smoking status: Former    Packs/day: 1.00    Years: 37.00    Total pack years: 37.00    Types: Cigarettes    Start date: 10/19/1989    Quit date: 08/20/2022    Years since quitting: 0.3   Smokeless tobacco: Never   Tobacco comments:    30+ years hx smoking 2 ppd, stopped smoking briefly in April 2022 for a surgery and then restarted smoking about 1/2 ppd last 4-5 months     Quit smoking 08/20/2022  Vaping Use   Vaping Use: Never used  Substance  Use Topics   Alcohol use: No   Drug use: No      Chart Review Today: I personally reviewed active problem list, medication list, allergies, family history, social history, health maintenance, notes from last encounter, lab results, imaging with the patient/caregiver today.   Review of Systems  Constitutional: Negative.   HENT: Negative.    Eyes: Negative.   Respiratory: Negative.    Cardiovascular: Negative.   Gastrointestinal: Negative.   Endocrine: Negative.   Genitourinary: Negative.   Musculoskeletal: Negative.   Skin: Negative.   Allergic/Immunologic: Negative.   Neurological: Negative.   Hematological: Negative.   Psychiatric/Behavioral: Negative.    All other systems reviewed and are negative.      Objective:  Vitals:   12/21/22 0814 12/21/22 0834  BP: (!) 174/86 (!) 162/86  Pulse: 78   Resp: 16   Temp: 98.1 F (36.7 C)   TempSrc: Oral   SpO2: 94%   Weight: 232 lb 4.8 oz (105.4 kg)   Height: '5\' 6"'$  (1.676 m)     Body mass index is 37.49 kg/m.  Physical Exam Vitals and nursing note reviewed.  Constitutional:      General: He is not in acute distress.    Appearance: He is well-developed. He is obese. He is not ill-appearing, toxic-appearing or diaphoretic.  HENT:     Head: Normocephalic and atraumatic.     Nose: Nose normal.  Eyes:     General:        Right eye: No discharge.        Left eye: No discharge.     Conjunctiva/sclera: Conjunctivae normal.  Neck:     Trachea: No tracheal deviation.  Cardiovascular:     Rate and Rhythm: Normal rate and regular rhythm.     Pulses: Normal pulses.     Heart sounds: Normal heart sounds.  Pulmonary:     Effort: Pulmonary effort is normal. No respiratory distress.     Breath sounds: No stridor.  Musculoskeletal:     Cervical back: Rigidity (baseline) present.     Thoracic back: Tenderness present. No swelling, edema, spasms or bony tenderness. Normal range of motion.     Lumbar back: Normal.      Comments: Antalgic gait Grossly normal sensation to light touch to B/L LE  Skin:    General: Skin is warm and dry.     Findings: No rash.  Neurological:     Mental Status: He is alert.     Motor: No abnormal muscle tone.     Coordination: Coordination normal.  Psychiatric:        Behavior: Behavior normal.      Results for orders placed or performed in visit on 12/06/22  COMPLETE METABOLIC PANEL WITH GFR  Result Value Ref Range   Glucose, Bld 293 (H) 65 - 99 mg/dL   BUN 16 7 - 25 mg/dL   Creat 1.13 0.70 - 1.30 mg/dL   eGFR 77 > OR = 60 mL/min/1.29m   BUN/Creatinine Ratio SEE NOTE: 6 - 22 (calc)   Sodium 138 135 - 146 mmol/L   Potassium 4.2 3.5 - 5.3 mmol/L   Chloride 103 98 - 110 mmol/L   CO2 23 20 - 32 mmol/L   Calcium 9.2 8.6 - 10.3 mg/dL   Total Protein 7.1 6.1 - 8.1 g/dL   Albumin 3.9 3.6 - 5.1 g/dL   Globulin 3.2 1.9 - 3.7 g/dL (calc)   AG Ratio 1.2 1.0 - 2.5 (calc)   Total Bilirubin 0.3 0.2 - 1.2 mg/dL   Alkaline phosphatase (APISO) 70 35 - 144 U/L   AST 12 10 - 35 U/L   ALT 13 9 - 46 U/L  CBC with Differential/Platelet  Result Value Ref Range   WBC 7.2 3.8 - 10.8 Thousand/uL   RBC 5.61 4.20 - 5.80 Million/uL   Hemoglobin 15.6 13.2 - 17.1 g/dL   HCT 47.0 38.5 - 50.0 %   MCV 83.8 80.0 - 100.0 fL   MCH 27.8 27.0 - 33.0 pg   MCHC 33.2 32.0 - 36.0 g/dL   RDW 14.0 11.0 - 15.0 %   Platelets 259 140 - 400 Thousand/uL   MPV 10.1 7.5 - 12.5 fL   Neutro Abs  5,126 1,500 - 7,800 cells/uL   Lymphs Abs 1,606 850 - 3,900 cells/uL   Absolute Monocytes 281 200 - 950 cells/uL   Eosinophils Absolute 144 15 - 500 cells/uL   Basophils Absolute 43 0 - 200 cells/uL   Neutrophils Relative % 71.2 %   Total Lymphocyte 22.3 %   Monocytes Relative 3.9 %   Eosinophils Relative 2.0 %   Basophils Relative 0.6 %  Lipid panel  Result Value Ref Range   Cholesterol 223 (H) <200 mg/dL   HDL 43 > OR = 40 mg/dL   Triglycerides 365 (H) <150 mg/dL   LDL Cholesterol (Calc) 128 (H) mg/dL  (calc)   Total CHOL/HDL Ratio 5.2 (H) <5.0 (calc)   Non-HDL Cholesterol (Calc) 180 (H) <130 mg/dL (calc)  PSA  Result Value Ref Range   PSA 2.02 < OR = 4.00 ng/mL  Hemoglobin A1c  Result Value Ref Range   Hgb A1c MFr Bld 6.9 (H) <5.7 % of total Hgb   Mean Plasma Glucose 151 mg/dL   eAG (mmol/L) 8.4 mmol/L       Assessment & Plan:   1. Acute thoracic back pain, unspecified back pain laterality Likely strain with several days of very physical work He has sig hx of spine issues/surgery - so lower threshold to screen with plain films if not gradually improving over the next couple weeks. Some radicular sx in history - can try steroid burst, he needs to avoid NSAIDS due to high BP, can do baclofen and his other meds for MSK pain - Baclofen 5 MG TABS; Take 1-2 tablets (5-10 mg total) by mouth 3 (three) times daily as needed (msk pain or spasms).  Dispense: 60 tablet; Refill: 1 - predniSONE (DELTASONE) 20 MG tablet; Take 2 tablets (40 mg total) by mouth daily with breakfast for 5 days.  Dispense: 10 tablet; Refill: 0 - DG Thoracic Spine W/Swimmers; Future  May need PT or to f/up with his past spine specialists     Delsa Grana, PA-C 12/21/22 8:39 AM

## 2022-12-26 ENCOUNTER — Ambulatory Visit (HOSPITAL_BASED_OUTPATIENT_CLINIC_OR_DEPARTMENT_OTHER): Payer: Medicaid Other | Admitting: Family

## 2022-12-26 ENCOUNTER — Encounter: Payer: Self-pay | Admitting: Family

## 2022-12-26 ENCOUNTER — Other Ambulatory Visit
Admission: RE | Admit: 2022-12-26 | Discharge: 2022-12-26 | Disposition: A | Discharge status: Home / Self Care | Payer: Medicaid Other | Source: Ambulatory Visit | Attending: Family | Admitting: Family

## 2022-12-26 VITALS — BP 188/95 | HR 75 | Resp 14 | Wt 231.2 lb

## 2022-12-26 DIAGNOSIS — E119 Type 2 diabetes mellitus without complications: Secondary | ICD-10-CM

## 2022-12-26 DIAGNOSIS — I5032 Chronic diastolic (congestive) heart failure: Secondary | ICD-10-CM

## 2022-12-26 DIAGNOSIS — Z794 Long term (current) use of insulin: Secondary | ICD-10-CM

## 2022-12-26 DIAGNOSIS — J45909 Unspecified asthma, uncomplicated: Secondary | ICD-10-CM

## 2022-12-26 DIAGNOSIS — I10 Essential (primary) hypertension: Secondary | ICD-10-CM

## 2022-12-26 DIAGNOSIS — I5030 Unspecified diastolic (congestive) heart failure: Secondary | ICD-10-CM | POA: Diagnosis not present

## 2022-12-26 LAB — BASIC METABOLIC PANEL
Anion gap: 11 (ref 5–15)
BUN: 19 mg/dL (ref 6–20)
CO2: 24 mmol/L (ref 22–32)
Calcium: 8.6 mg/dL — ABNORMAL LOW (ref 8.9–10.3)
Chloride: 100 mmol/L (ref 98–111)
Creatinine, Ser: 1.27 mg/dL — ABNORMAL HIGH (ref 0.61–1.24)
GFR, Estimated: >60 mL/min (ref >60)
Glucose, Bld: 264 mg/dL — ABNORMAL HIGH (ref 70–99)
Potassium: 3.6 mmol/L (ref 3.5–5.1)
Sodium: 135 mmol/L (ref 135–145)

## 2022-12-26 NOTE — Patient Instructions (Addendum)
Go to the [facility] and get your lab work checked.    You need to please bring your medications that you are taking to every visit

## 2022-12-26 NOTE — Progress Notes (Signed)
Patient ID: Cory Rivas, male    DOB: [birth year], 57 y.o.   MRN: [id number]  HPI  Cory Rivas is a 57 y/o male with a history of DM, hyperlipidemia, HTN, gout, tobacco use and chronic heart failure.   Echo report from 08/23/22 showed an EF of 60-65% along with mild LVH & mild Cory.   Admitted 11/22/22 due to acute on chronic HF. Weaned off of bipap.Admitted 08/23/22 due to dyspnea, productive cough of brown sputum, mild sore throat, fatigue, dizziness and chest soreness. Steroid burst given. Initially given IV lasix with transition to oral diuretics. Lisinopril held due to AKI. Discharged after 2 days. Was in the ED 08/15/22 due to acute pulmonary edema where he was treated and released.    He presents today for a follow-up visit with a chief complaint of minimal fatigue upon moderate exertion. Chronic in nature. Has associated chronic difficulty sleeping and back/ neck pain along with this. Denies any abdominal distention, palpitations, pedal edema, chest pain, wheezing, SOB, cough or dizziness.   Says that he recently hurt his back somehow and had to see his PCP. Was placed on muscle relaxer and prednisone. Says that he's still very stiff and sore and in quite a bit of pain from this. Started these medications 4 days ago.   Past Medical History:  Diagnosis Date   Bulging of cervical intervertebral disc    CHF (congestive heart failure) (HCC)    Diabetes mellitus without complication (H[facility])    Gout    High cholesterol    Hypertension    Stress due to family tension 04/15/2019   Past Surgical History:  Procedure Laterality Date   CHOLECYSTECTOMY     COLONOSCOPY WITH PROPOFOL N/A 03/10/2020   Procedure: COLONOSCOPY WITH PROPOFOL;  Surgeon: Lucilla Lame, MD;  Location: [facility] ENDOSCOPY;  Service: Endoscopy;  Laterality: N/A;   Family History  Problem Relation Age of Onset   Heart disease Mother    Diabetes Mother    Hyperlipidemia Mother    Hypertension Mother    Heart attack Mother    Lung  cancer Father    Hypertension Father    Stroke Father    AAA (abdominal aortic aneurysm) Maternal Grandmother    Heart attack Maternal Grandmother    Diabetes Maternal Grandfather    Social History   Tobacco Use   Smoking status: Former    Packs/day: 1.00    Years: 37.00    Total pack years: 37.00    Types: Cigarettes    Start date: 10/19/1989    Quit date: 08/20/2022    Years since quitting: 0.3   Smokeless tobacco: Never   Tobacco comments:    30+ years hx smoking 2 ppd, stopped smoking briefly in April 2022 for a surgery and then restarted smoking about 1/2 ppd last 4-5 months     Quit smoking 08/20/2022  Substance Use Topics   Alcohol use: No   Allergies  Allergen Reactions   Oxycodone Nausea And Vomiting   Onion Nausea And Vomiting   Prior to Admission medications   Medication Sig Start Date End Date Taking? Authorizing Provider  allopurinol (ZYLOPRIM) 100 MG tablet TAKE 1 TABLET BY MOUTH ONCE A DAY. TAKE WITH 300 MG TABLETS 08/30/22  Yes Delsa Grana, PA-C  allopurinol (ZYLOPRIM) 300 MG tablet Take one 300 mg tab po daily WITH 100 mg tab for total of 400 mg once daily for gout 08/30/22  Yes Delsa Grana, PA-C  Baclofen 5 MG TABS Take 1-2  tablets (5-10 mg total) by mouth 3 (three) times daily as needed (msk pain or spasms). 12/21/22  Yes Delsa Grana, PA-C  benzonatate (TESSALON) 200 MG capsule Take 1 capsule (200 mg total) by mouth 2 (two) times daily as needed for cough. 11/13/22  Yes Mecum, Erin E, PA-C  DULoxetine (CYMBALTA) 30 MG capsule TAKE 1 CAPSULE BY MOUTH 2 TIMES DAILY 10/01/22  Yes Delsa Grana, PA-C  empagliflozin (JARDIANCE) 10 MG TABS tablet Take 1 tablet (10 mg total) by mouth daily before breakfast. Bringing voucher 10/22/22  Yes Darylene Price A, FNP  fluticasone (FLONASE) 50 MCG/ACT nasal spray Place 1 spray into both nostrils daily. 08/01/22 08/01/23 Yes Dgayli, Berdine Addison, MD  furosemide (LASIX) 40 MG tablet Take 1 tablet (40 mg total) by mouth daily. TAKE 1 TABLET  BY MOUTH DAILY. TAKE WITH POTASSIUM. 11/24/22  Yes Sreenath, Sudheer B, MD  indomethacin (INDOCIN) 25 MG capsule TAKE 2 TABLETS (50 MG) BY 3 TIMES DAILY FOR 2 DAYS AT ONSET OF GOUT FLARE UP, THEN TAKE 1('25MG'$ )TABLET BY MOUTH FOR 3 DAYS 09/05/21  Yes Delsa Grana, PA-C  insulin glargine (LANTUS SOLOSTAR) 100 UNIT/ML Solostar Pen Inject 25 Units into the skin at bedtime. 11/01/20  Yes [provider]  insulin lispro (HUMALOG) 100 UNIT/ML KwikPen Inject 1-45 Units into the skin 3 (three) times daily. Sliding scale up to 45 units per injection 11/08/20  Yes [provider]  ipratropium-albuterol (DUONEB) 0.5-2.5 (3) MG/3ML SOLN Take 3 mLs by nebulization every 6 (six) hours as needed (SOB wheeze cough). 07/23/22  Yes Delsa Grana, PA-C  levocetirizine (XYZAL) 5 MG tablet Take 1 tablet (5 mg total) by mouth daily. 08/01/22 08/01/23 Yes Dgayli, Berdine Addison, MD  lisinopril (ZESTRIL) 40 MG tablet Take 40 mg by mouth daily. 12/11/22  Yes [provider]  metFORMIN (GLUCOPHAGE) 1000 MG tablet TAKE 1 TABLET BY MOUTH TWICE A DAY WITH A MEAL 03/06/22  Yes Delsa Grana, PA-C  nicotine polacrilex (NICOTINE MINI) 2 MG lozenge Take 1 lozenge (2 mg total) by mouth every 2 (two) hours as needed for smoking cessation. 12/04/22 03/04/23 Yes Dgayli, Berdine Addison, MD  potassium chloride SA (KLOR-CON M) 20 MEQ tablet Take 1 tablet (20 mEq total) by mouth daily. Take with furosemide 08/28/22  Yes Delsa Grana, PA-C  predniSONE (DELTASONE) 20 MG tablet Take 2 tablets (40 mg total) by mouth daily with breakfast for 5 days. 12/21/22 12/26/22 Yes Delsa Grana, PA-C  pregabalin (LYRICA) 25 MG capsule TAKE 1 CAPSULE BY MOUTH IN THE MORNING AND 1 TO 3 CAPSULES EVERY NIGHT FOR CERVICAL RADICULOPATHY AND NEUROPATHY 07/10/22  Yes Delsa Grana, PA-C  rosuvastatin (CRESTOR) 40 MG tablet Take 1 tablet (40 mg total) by mouth daily. 01/26/22  Yes Delsa Grana, PA-C  Tiotropium Bromide Monohydrate (SPIRIVA RESPIMAT) 2.5 MCG/ACT AERS Inhale 2  puffs into the lungs daily. 09/24/22  Yes Dgayli, Berdine Addison, MD  TRULICITY 3 0000000 SOPN Inject into the skin. 03/26/22  Yes [provider]  VENTOLIN HFA 108 (90 Base) MCG/ACT inhaler INHALE 2 PUFFS INTO THE LUNGS EVERY 6 HOURS AS NEEDED FOR WHEEZING OR SHORTNESS OF BREATH 04/05/22  Yes Delsa Grana, PA-C    Review of Systems  Constitutional:  Positive for fatigue.  HENT:  Negative for postnasal drip.   Eyes: Negative.   Respiratory:  Negative for cough, chest tightness, shortness of breath and wheezing.   Cardiovascular:  Negative for chest pain, palpitations and leg swelling.  Gastrointestinal:  Negative for abdominal distention and abdominal pain.  Endocrine: Negative.  Genitourinary: Negative.   Musculoskeletal:  Positive for back pain and neck stiffness.  Skin: Negative.   Allergic/Immunologic: Negative.   Neurological:  Negative for dizziness and light-headedness.  Hematological:  Negative for adenopathy. Does not bruise/bleed easily.  Psychiatric/Behavioral:  Positive for sleep disturbance (chronic trouble sleeping). Negative for dysphoric mood. The patient is not nervous/anxious.    Vitals:   12/26/22 1324  BP: (!) 188/95  Pulse: 75  Resp: 14  SpO2: 98%  Weight: 231 lb 4 oz (104.9 kg)   Wt Readings from Last 3 Encounters:  12/26/22 231 lb 4 oz (104.9 kg)  12/21/22 232 lb 4.8 oz (105.4 kg)  12/10/22 232 lb (105.2 kg)   Lab Results  Component Value Date   CREATININE 1.27 (H) 12/26/2022   CREATININE 1.13 12/06/2022   CREATININE 1.50 (H) 11/27/2022   Physical Exam Vitals and nursing note reviewed.  Constitutional:      Appearance: He is well-developed.  HENT:     Head: Normocephalic and atraumatic.  Neck:     Vascular: No JVD.  Cardiovascular:     Rate and Rhythm: Regular rhythm.  Pulmonary:     Effort: Pulmonary effort is normal.     Breath sounds: No wheezing, rhonchi or rales.  Abdominal:     Palpations: Abdomen is soft.     Tenderness: There is  no abdominal tenderness.  Musculoskeletal:        General: Tenderness (back and right side of neck) present.     Cervical back: Normal range of motion and neck supple.     Right lower leg: No tenderness. No edema.     Left lower leg: No tenderness. No edema.  Skin:    General: Skin is warm and dry.  Neurological:     General: No focal deficit present.     Mental Status: He is alert and oriented to person, place, and time.  Psychiatric:        Mood and Affect: Mood normal.        Behavior: Behavior normal.    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- - NYHA class II - euvolemic - weighing daily; reminded to call for an overnight weight gain of >2 pounds or a weekly weight gain of > 5 pounds - weight up 5 pounds from last visit here 1 month ago - echo 08/23/22: EF of 60-65% along with mild LVH & mild Cory. - jardiance '10mg'$  QD - lisinopril '40mg'$  daily - consider entresto - saw cardiology (Agbor-Etang) 09/21/22 - drinking 4-5 water bottles and 1 soda daily - BNP 11/22/22 was 218.3  2: HTN- - BP 188/95 but he's quite a bit of back/ neck pain & he's finishing up prednisone - if BP remains elevated at next visit, will discuss adding changing his lisinopril to entresto - saw PCP Lucio Edward) 12/21/22  - BMP 12/06/22 reviewed and showed sodium 138, potassium 4.2, creatinine 1.13 and GFR 77  3: DM- - A1c 12/06/22 was 6.9% - currently on trulicity, metformin and insulin - had telemedicine visit with endocrinology 10/18/22  4: Reactive airway disease- - saw pulmonology (Dgayli) 12/10/22 - using spiriva & xyzal - hasn't smoked in ~ 2 months   Medication list reviewed although patient gets his meds packaged from his pharmacy and he can't state w/ certainty what he's taking. Pharmacy called to verify med list and emphasized to patient that he bring his meds to every visit every time  Return in 2 weeks,  sooner if needed

## 2022-12-27 DIAGNOSIS — G4733 Obstructive sleep apnea (adult) (pediatric): Secondary | ICD-10-CM | POA: Diagnosis not present

## 2022-12-27 DIAGNOSIS — I10 Essential (primary) hypertension: Secondary | ICD-10-CM | POA: Diagnosis not present

## 2023-01-10 ENCOUNTER — Ambulatory Visit: Payer: Medicaid Other | Attending: Family | Admitting: Family

## 2023-01-10 ENCOUNTER — Encounter: Payer: Self-pay | Admitting: Family

## 2023-01-10 ENCOUNTER — Other Ambulatory Visit: Payer: Self-pay

## 2023-01-10 VITALS — BP 175/89 | HR 68 | Resp 14 | Wt 234.1 lb

## 2023-01-10 DIAGNOSIS — E785 Hyperlipidemia, unspecified: Secondary | ICD-10-CM | POA: Insufficient documentation

## 2023-01-10 DIAGNOSIS — I5032 Chronic diastolic (congestive) heart failure: Secondary | ICD-10-CM | POA: Diagnosis not present

## 2023-01-10 DIAGNOSIS — Z7984 Long term (current) use of oral hypoglycemic drugs: Secondary | ICD-10-CM | POA: Diagnosis not present

## 2023-01-10 DIAGNOSIS — I509 Heart failure, unspecified: Secondary | ICD-10-CM | POA: Insufficient documentation

## 2023-01-10 DIAGNOSIS — I10 Essential (primary) hypertension: Secondary | ICD-10-CM | POA: Diagnosis not present

## 2023-01-10 DIAGNOSIS — I428 Other cardiomyopathies: Secondary | ICD-10-CM | POA: Diagnosis not present

## 2023-01-10 DIAGNOSIS — Z87891 Personal history of nicotine dependence: Secondary | ICD-10-CM | POA: Diagnosis not present

## 2023-01-10 DIAGNOSIS — R0602 Shortness of breath: Secondary | ICD-10-CM | POA: Diagnosis not present

## 2023-01-10 DIAGNOSIS — R42 Dizziness and giddiness: Secondary | ICD-10-CM | POA: Insufficient documentation

## 2023-01-10 DIAGNOSIS — Z79899 Other long term (current) drug therapy: Secondary | ICD-10-CM | POA: Insufficient documentation

## 2023-01-10 DIAGNOSIS — Z794 Long term (current) use of insulin: Secondary | ICD-10-CM | POA: Insufficient documentation

## 2023-01-10 DIAGNOSIS — E119 Type 2 diabetes mellitus without complications: Secondary | ICD-10-CM | POA: Insufficient documentation

## 2023-01-10 DIAGNOSIS — J45909 Unspecified asthma, uncomplicated: Secondary | ICD-10-CM | POA: Insufficient documentation

## 2023-01-10 DIAGNOSIS — I110 Hypertensive heart disease with heart failure: Secondary | ICD-10-CM | POA: Diagnosis not present

## 2023-01-10 DIAGNOSIS — M109 Gout, unspecified: Secondary | ICD-10-CM | POA: Insufficient documentation

## 2023-01-10 MED ORDER — POTASSIUM CHLORIDE CRYS ER 20 MEQ PO TBCR: 20.0000 meq | EXTENDED_RELEASE_TABLET | Freq: Two times a day (BID) | ORAL | 5 refills | Status: DC

## 2023-01-10 MED ORDER — POTASSIUM CHLORIDE CRYS ER 20 MEQ PO TBCR: 20.0000 meq | EXTENDED_RELEASE_TABLET | Freq: Every day | ORAL | 5 refills | Status: DC

## 2023-01-10 MED ORDER — FUROSEMIDE 40 MG PO TABS: 40.0000 mg | ORAL_TABLET | Freq: Two times a day (BID) | ORAL | 5 refills | Status: DC

## 2023-01-10 MED ORDER — SACUBITRIL-VALSARTAN 49-51 MG PO TABS: 1.0000 | ORAL_TABLET | Freq: Two times a day (BID) | ORAL | 5 refills | Status: DC

## 2023-01-10 NOTE — Patient Instructions (Addendum)
Keep your daily fluid intake to 60-64 ounces a day. This equals 4 water bottle sizes of liquids and includes all liquids that you would drink. If you mouth gets dry, try chewing some sugar free gum or sugar free candy.    I've sent in a new prescription called Entresto which may be in your April pill pack. When you start taking this, it will be 1 tablet in the morning and 1 tablet in the evening. You will no longer be taking lisinopril. It's possible this change may not start until your May pill pack.    You will start taking your lasix/ potassium twice a day

## 2023-01-10 NOTE — Progress Notes (Signed)
Patient ID: Cory Rivas, male    DOB: 1966/04/18, 57 y.o.   MRN: [id number]  HPI  Cory Rivas is a 57 y/o male with a history of DM, hyperlipidemia, HTN, gout, tobacco use and chronic heart failure.   Echo 08/23/22: EF of 60-65% along with mild LVH & mild Cory.   Admitted 11/22/22 due to acute on chronic HF. Weaned off of bipap.Admitted 08/23/22 due to dyspnea, productive cough of brown sputum, mild sore throat, fatigue, dizziness and chest soreness. Steroid burst given. Initially given IV lasix with transition to oral diuretics. Lisinopril held due to AKI. Discharged after 2 days. Was in the ED 08/15/22 due to acute pulmonary edema where he was treated and released.    He presents today for a HF follow-up visit with a chief complaint of moderate SOB with little exertion. Chronic in nature. Was worse earlier this week after mowing the yard but then he started using his nebulizer and notes that his SOB is improving. Has fatigue, light-headedness, chronic difficulty sleeping, slight weight gain and vomiting along with this. Denies abdominal distention, palpitations, pedal edema, chest pain, wheezing or cough.   Says that he's been vomiting almost daily after he takes his afternoon meds. He didn't bring his pill packs with him and has no idea what he takes in the afternoon.   Drinking 2-3 bottles of soda and 4-5 bottles of water daily because he says that his mouth stays dry.   Past Medical History:  Diagnosis Date   Bulging of cervical intervertebral disc    CHF (congestive heart failure) (HCC)    Diabetes mellitus without complication (HCC)    Gout    High cholesterol    Hypertension    Stress due to family tension 04/15/2019   Past Surgical History:  Procedure Laterality Date   CHOLECYSTECTOMY     COLONOSCOPY WITH PROPOFOL N/A 03/10/2020   Procedure: COLONOSCOPY WITH PROPOFOL;  Surgeon: Lucilla Lame, MD;  Location: [facility] ENDOSCOPY;  Service: Endoscopy;  Laterality: N/A;   Family History   Problem Relation Age of Onset   Heart disease Mother    Diabetes Mother    Hyperlipidemia Mother    Hypertension Mother    Heart attack Mother    Lung cancer Father    Hypertension Father    Stroke Father    AAA (abdominal aortic aneurysm) Maternal Grandmother    Heart attack Maternal Grandmother    Diabetes Maternal Grandfather    Social History   Tobacco Use   Smoking status: Former    Packs/day: 1.00    Years: 37.00    Additional pack years: 0.00    Total pack years: 37.00    Types: Cigarettes    Start date: 10/19/1989    Quit date: 08/20/2022    Years since quitting: 0.3   Smokeless tobacco: Never   Tobacco comments:    30+ years hx smoking 2 ppd, stopped smoking briefly in April 2022 for a surgery and then restarted smoking about 1/2 ppd last 4-5 months     Quit smoking 08/20/2022  Substance Use Topics   Alcohol use: No   Allergies  Allergen Reactions   Oxycodone Nausea And Vomiting   Onion Nausea And Vomiting   Prior to Admission medications   Medication Sig Start Date End Date Taking? Authorizing Provider  allopurinol (ZYLOPRIM) 100 MG tablet TAKE 1 TABLET BY MOUTH ONCE A DAY. TAKE WITH 300 MG TABLETS 08/30/22  Yes Delsa Grana, PA-C  allopurinol (ZYLOPRIM) 300 MG  tablet Take one 300 mg tab po daily WITH 100 mg tab for total of 400 mg once daily for gout 08/30/22  Yes Delsa Grana, PA-C  DULoxetine (CYMBALTA) 30 MG capsule TAKE 1 CAPSULE BY MOUTH 2 TIMES DAILY 10/01/22  Yes Delsa Grana, PA-C  empagliflozin (JARDIANCE) 10 MG TABS tablet Take 1 tablet (10 mg total) by mouth daily before breakfast. Bringing voucher 10/22/22  Yes Darylene Price A, FNP  fluticasone (FLONASE) 50 MCG/ACT nasal spray Place 1 spray into both nostrils daily. 08/01/22 08/01/23 Yes Dgayli, Berdine Addison, MD  furosemide (LASIX) 40 MG tablet Take 1 tablet (40 mg total) by mouth daily. TAKE 1 TABLET BY MOUTH DAILY. TAKE WITH POTASSIUM. 11/24/22  Yes Sreenath, Sudheer B, MD  insulin glargine (LANTUS SOLOSTAR)  100 UNIT/ML Solostar Pen Inject 25 Units into the skin at bedtime. 11/01/20  Yes [provider]  insulin lispro (HUMALOG) 100 UNIT/ML KwikPen Inject 1-45 Units into the skin 3 (three) times daily. Sliding scale up to 45 units per injection 11/08/20  Yes [provider]  ipratropium-albuterol (DUONEB) 0.5-2.5 (3) MG/3ML SOLN Take 3 mLs by nebulization every 6 (six) hours as needed (SOB wheeze cough). 07/23/22  Yes Delsa Grana, PA-C  lisinopril (ZESTRIL) 40 MG tablet Take 40 mg by mouth daily. 12/11/22  Yes [provider]  metFORMIN (GLUCOPHAGE) 1000 MG tablet TAKE 1 TABLET BY MOUTH TWICE A DAY WITH A MEAL 03/06/22  Yes Delsa Grana, PA-C  nicotine polacrilex (NICOTINE MINI) 2 MG lozenge Take 1 lozenge (2 mg total) by mouth every 2 (two) hours as needed for smoking cessation. 12/04/22 03/04/23 Yes Dgayli, Berdine Addison, MD  potassium chloride SA (KLOR-CON M) 20 MEQ tablet Take 1 tablet (20 mEq total) by mouth daily. Take with furosemide 08/28/22  Yes Tapia, Leisa, PA-C  pregabalin (LYRICA) 25 MG capsule TAKE 1 CAPSULE BY MOUTH IN THE MORNING AND 1 TO 3 CAPSULES EVERY NIGHT FOR CERVICAL RADICULOPATHY AND NEUROPATHY 07/10/22  Yes Delsa Grana, PA-C  rosuvastatin (CRESTOR) 40 MG tablet Take 1 tablet (40 mg total) by mouth daily. 01/26/22  Yes Delsa Grana, PA-C  Tiotropium Bromide Monohydrate (SPIRIVA RESPIMAT) 2.5 MCG/ACT AERS Inhale 2 puffs into the lungs daily. 09/24/22  Yes Dgayli, Berdine Addison, MD  TRULICITY 3 0000000 SOPN Inject into the skin. 03/26/22  Yes [provider]  VENTOLIN HFA 108 (90 Base) MCG/ACT inhaler INHALE 2 PUFFS INTO THE LUNGS EVERY 6 HOURS AS NEEDED FOR WHEEZING OR SHORTNESS OF BREATH 04/05/22  Yes Delsa Grana, PA-C   Review of Systems  Constitutional:  Positive for fatigue. Negative for appetite change.  HENT:  Negative for congestion, postnasal drip and sore throat.   Eyes: Negative.   Respiratory:  Positive for shortness of breath (was worse a few days ago  and now improving). Negative for cough, chest tightness and wheezing.   Cardiovascular:  Negative for chest pain, palpitations and leg swelling.  Gastrointestinal:  Positive for vomiting (in afternoons after taking meds). Negative for abdominal distention and abdominal pain.  Endocrine: Negative.   Genitourinary: Negative.   Musculoskeletal:  Positive for neck stiffness. Negative for back pain.  Skin: Negative.   Allergic/Immunologic: Negative.   Neurological:  Positive for light-headedness. Negative for dizziness.  Hematological:  Negative for adenopathy. Does not bruise/bleed easily.  Psychiatric/Behavioral:  Positive for sleep disturbance (chronic trouble sleeping). Negative for dysphoric mood. The patient is not nervous/anxious.    Vitals:   01/10/23 1315  BP: (!) 175/89  Pulse: 68  Resp: 14  SpO2: 97%  Weight: 234 lb 2 oz (106.2 kg)   Wt Readings from Last 3 Encounters:  01/10/23 234 lb 2 oz (106.2 kg)  12/26/22 231 lb 4 oz (104.9 kg)  12/21/22 232 lb 4.8 oz (105.4 kg)   Lab Results  Component Value Date   CREATININE 1.27 (H) 12/26/2022   CREATININE 1.13 12/06/2022   CREATININE 1.50 (H) 11/27/2022   Physical Exam Vitals and nursing note reviewed.  Constitutional:      Appearance: He is well-developed.  HENT:     Head: Normocephalic and atraumatic.  Neck:     Vascular: No JVD.  Cardiovascular:     Rate and Rhythm: Regular rhythm.  Pulmonary:     Effort: Pulmonary effort is normal.     Breath sounds: No wheezing, rhonchi or rales.  Abdominal:     Palpations: Abdomen is soft.     Tenderness: There is no abdominal tenderness.  Musculoskeletal:     Cervical back: Normal range of motion and neck supple.     Right lower leg: No tenderness. No edema.     Left lower leg: No tenderness. No edema.  Skin:    General: Skin is warm and dry.  Neurological:     General: No focal deficit present.     Mental Status: He is alert and oriented to person, place, and time.   Psychiatric:        Mood and Affect: Mood normal.        Behavior: Behavior normal.    Assessment & Plan:  1: NICM with preserved ejection fraction with LVH- - NYHA class III - fluid overloaded with elevated ReDs reading, increased weight and worsening symptoms - weighing daily; reminded to call for an overnight weight gain of >2 pounds or a weekly weight gain of > 5 pounds - weight up 3 pounds from last visit here 6 weeks ago - echo 08/23/22: EF of 60-65% along with mild LVH & mild Cory. - continue jardiance 10mg  QD - lisinopril 40mg  daily; stop taking this - increase furosemide 40mg  to BID and potassium 36meq to BID - begin entresto 49/51mg  BID when it gets put in your pill pack - BMP next visit - ReDs clip reading today was 42% - BMP next visit - saw cardiology ([place]) 09/21/22 - drinking 4-5 water bottles and 2-3 bottles of soda; reviewed keeping daily intake to 60-64 oz of fluids and use sugar free candy/ gum for a dry mouth - BNP 11/22/22 was 218.3  2: HTN- - BP 175/89; adjusting meds per above - saw PCP Lucio Edward) 12/21/22  - BMP 12/26/22 reviewed and showed sodium 135, potassium 3.6, creatinine 1.27 and GFR >60  3: DM- - A1c 12/06/22 was 6.9% - currently on trulicity, metformin and insulin - had telemedicine visit with endocrinology 10/18/22  4: Reactive airway disease- - saw pulmonology (Dgayli) 12/10/22 - using spiriva  - hasn't smoked in ~ 3 months   Patient gets medications pill packed and can't say with certainty what he's taking. He has no idea which meds he is taking in the afternoon that could possibly be making him nauseous. Emphasized, again, that he needs to bring the pill packs with him.   Return in 2 weeks, sooner if needed.

## 2023-01-10 NOTE — Progress Notes (Signed)
ReDS Vest / Clip - 01/10/23 1354       ReDS Vest / Clip   Station Marker D    Ruler Value 34    ReDS Value Range High volume overload    ReDS Actual Value 42

## 2023-01-11 ENCOUNTER — Other Ambulatory Visit: Payer: Self-pay | Admitting: Family Medicine

## 2023-01-11 ENCOUNTER — Other Ambulatory Visit: Payer: Self-pay | Admitting: Family

## 2023-01-11 ENCOUNTER — Other Ambulatory Visit (HOSPITAL_COMMUNITY): Payer: Self-pay

## 2023-01-11 ENCOUNTER — Telehealth (HOSPITAL_COMMUNITY): Payer: Self-pay

## 2023-01-11 DIAGNOSIS — G959 Disease of spinal cord, unspecified: Secondary | ICD-10-CM

## 2023-01-11 DIAGNOSIS — M5136 Other intervertebral disc degeneration, lumbar region: Secondary | ICD-10-CM

## 2023-01-11 DIAGNOSIS — E1169 Type 2 diabetes mellitus with other specified complication: Secondary | ICD-10-CM

## 2023-01-11 DIAGNOSIS — G8929 Other chronic pain: Secondary | ICD-10-CM

## 2023-01-11 DIAGNOSIS — M5412 Radiculopathy, cervical region: Secondary | ICD-10-CM

## 2023-01-11 NOTE — Telephone Encounter (Signed)
Patient Advocate Encounter  Prior authorization for [employer] submitted and APPROVED. [place]  Effective: 01/11/23 - 01/11/24

## 2023-01-17 ENCOUNTER — Other Ambulatory Visit: Payer: Self-pay | Admitting: Student in an Organized Health Care Education/Training Program

## 2023-01-17 DIAGNOSIS — Z794 Long term (current) use of insulin: Secondary | ICD-10-CM | POA: Diagnosis not present

## 2023-01-17 DIAGNOSIS — Z5181 Encounter for therapeutic drug level monitoring: Secondary | ICD-10-CM | POA: Diagnosis not present

## 2023-01-17 DIAGNOSIS — E1165 Type 2 diabetes mellitus with hyperglycemia: Secondary | ICD-10-CM | POA: Diagnosis not present

## 2023-01-17 DIAGNOSIS — Z720 Tobacco use: Secondary | ICD-10-CM

## 2023-01-17 DIAGNOSIS — I10 Essential (primary) hypertension: Secondary | ICD-10-CM | POA: Diagnosis not present

## 2023-01-17 DIAGNOSIS — F17210 Nicotine dependence, cigarettes, uncomplicated: Secondary | ICD-10-CM | POA: Diagnosis not present

## 2023-01-17 DIAGNOSIS — Z6837 Body mass index (BMI) 37.0-37.9, adult: Secondary | ICD-10-CM | POA: Diagnosis not present

## 2023-01-17 DIAGNOSIS — E1169 Type 2 diabetes mellitus with other specified complication: Secondary | ICD-10-CM | POA: Diagnosis not present

## 2023-01-22 ENCOUNTER — Other Ambulatory Visit: Payer: Self-pay | Admitting: Family Medicine

## 2023-01-22 DIAGNOSIS — M1A09X0 Idiopathic chronic gout, multiple sites, without tophus (tophi): Secondary | ICD-10-CM

## 2023-01-22 DIAGNOSIS — R0602 Shortness of breath: Secondary | ICD-10-CM

## 2023-01-22 NOTE — Telephone Encounter (Signed)
Medication Refill - Medication: fluticasone (FLONASE) 50 MCG/ACT nasal spray   Has the patient contacted their pharmacy? Yes.     Preferred Pharmacy (with phone number or street name):  [facility] - [place], [place] - [address]  [address] [place] [address]  Phone: [phone] Fax: [phone]  Hours: Not open 24 hours     Has the patient been seen for an appointment in the last year OR does the patient have an upcoming appointment? Yes.    Agent: Please be advised that RX refills may take up to 3 business days. We ask that you follow-up with your pharmacy.

## 2023-01-23 MED ORDER — FLUTICASONE PROPIONATE 50 MCG/ACT NA SUSP
1.0000 | Freq: Every day | NASAL | 6 refills | Status: DC
Start: 2023-01-23 — End: 2024-02-10

## 2023-01-23 NOTE — Telephone Encounter (Signed)
Requested medication (s) are due for refill today: yes  Requested medication (s) are on the active medication list: yes  Last refill:  08/01/22  Future visit scheduled: yes  Notes to clinic:  Unable to refill per protocol, last refill by another provider not at this practice, routing for approval.     Requested Prescriptions  Pending Prescriptions Disp Refills   fluticasone (FLONASE) 50 MCG/ACT nasal spray 18.2 mL 6    Sig: Place 1 spray into both nostrils daily.     Ear, Nose, and Throat: Nasal Preparations - Corticosteroids Passed - 01/22/2023  4:25 PM      Passed - Valid encounter within last 12 months    Recent Outpatient Visits           1 month ago Acute thoracic back pain, unspecified back pain laterality   [facility] Danelle Berry, PA-C   1 month ago Annual physical exam   [facility] Danelle Berry, PA-C   2 months ago Strep pharyngitis   [facility], Oswaldo Conroy, PA-C   4 months ago Impacted cerumen of right ear   [facility] Berniece Salines, FNP   4 months ago Encounter for examination following treatment at hospital   [facility] Danelle Berry, [place]       Future Appointments             In 4 weeks Agbor-Etang, Arlys John, MD [facility] [place]   In 1 month Danelle Berry, PA-C [facility]   In 2 months Raechel Chute, MD [facility]

## 2023-01-23 NOTE — Progress Notes (Unsigned)
Patient ID: Cory Rivas, male    DOB: 1966-03-17, 57 y.o.   MRN: [id number]  Primary cardiologist: Debbe Odea, MD (last seen PCP: Danelle Berry, PA-C (last seen  HPI  Cory Rivas is a 57 y/o male with a history of DM, hyperlipidemia, HTN, gout, tobacco use and chronic heart failure.   Echo 08/23/22: EF of 60-65% along with mild LVH & mild Cory.   Admitted 11/22/22 due to acute on chronic HF. Weaned off of bipap.Admitted 08/23/22 due to dyspnea, productive cough of brown sputum, mild sore throat, fatigue, dizziness and chest soreness. Steroid burst given. Initially given IV lasix with transition to oral diuretics. Lisinopril held due to AKI. Discharged after 2 days. Was in the ED 08/15/22 due to acute pulmonary edema where he was treated and released.    He presents today for a HF follow-up visit with a chief complaint of   Drinking 2-3 bottles of soda and 4-5 bottles of water daily because he says that his mouth stays dry.   Past Medical History:  Diagnosis Date   Bulging of cervical intervertebral disc    CHF (congestive heart failure) (HCC)    Diabetes mellitus without complication (HCC)    Gout    High cholesterol    Hypertension    Stress due to family tension 04/15/2019   Past Surgical History:  Procedure Laterality Date   CHOLECYSTECTOMY     COLONOSCOPY WITH PROPOFOL N/A 03/10/2020   Procedure: COLONOSCOPY WITH PROPOFOL;  Surgeon: Midge Minium, MD;  Location: [facility] ENDOSCOPY;  Service: Endoscopy;  Laterality: N/A;   Family History  Problem Relation Age of Onset   Heart disease Mother    Diabetes Mother    Hyperlipidemia Mother    Hypertension Mother    Heart attack Mother    Lung cancer Father    Hypertension Father    Stroke Father    AAA (abdominal aortic aneurysm) Maternal Grandmother    Heart attack Maternal Grandmother    Diabetes Maternal Grandfather    Social History   Tobacco Use   Smoking status: Former    Packs/day: 1.00    Years: 37.00    Additional  pack years: 0.00    Total pack years: 37.00    Types: Cigarettes    Start date: 10/19/1989    Quit date: 08/20/2022    Years since quitting: 0.4   Smokeless tobacco: Never   Tobacco comments:    30+ years hx smoking 2 ppd, stopped smoking briefly in April 2022 for a surgery and then restarted smoking about 1/2 ppd last 4-5 months     Quit smoking 08/20/2022  Substance Use Topics   Alcohol use: No   Allergies  Allergen Reactions   Oxycodone Nausea And Vomiting   Onion Nausea And Vomiting    Review of Systems  Constitutional:  Positive for fatigue. Negative for appetite change.  HENT:  Negative for congestion, postnasal drip and sore throat.   Eyes: Negative.   Respiratory:  Positive for shortness of breath (was worse a few days ago and now improving). Negative for cough, chest tightness and wheezing.   Cardiovascular:  Negative for chest pain, palpitations and leg swelling.  Gastrointestinal:  Positive for vomiting (in afternoons after taking meds). Negative for abdominal distention and abdominal pain.  Endocrine: Negative.   Genitourinary: Negative.   Musculoskeletal:  Positive for neck stiffness. Negative for back pain.  Skin: Negative.   Allergic/Immunologic: Negative.   Neurological:  Positive for light-headedness. Negative for dizziness.  Hematological:  Negative for adenopathy. Does not bruise/bleed easily.  Psychiatric/Behavioral:  Positive for sleep disturbance (chronic trouble sleeping). Negative for dysphoric mood. The patient is not nervous/anxious.      Physical Exam Vitals and nursing note reviewed.  Constitutional:      Appearance: He is well-developed.  HENT:     Head: Normocephalic and atraumatic.  Neck:     Vascular: No JVD.  Cardiovascular:     Rate and Rhythm: Regular rhythm.  Pulmonary:     Effort: Pulmonary effort is normal.     Breath sounds: No wheezing, rhonchi or rales.  Abdominal:     Palpations: Abdomen is soft.     Tenderness: There is no  abdominal tenderness.  Musculoskeletal:     Cervical back: Normal range of motion and neck supple.     Right lower leg: No tenderness. No edema.     Left lower leg: No tenderness. No edema.  Skin:    General: Skin is warm and dry.  Neurological:     General: No focal deficit present.     Mental Status: He is alert and oriented to person, place, and time.  Psychiatric:        Mood and Affect: Mood normal.        Behavior: Behavior normal.    Assessment & Plan:  1: NICM with preserved ejection fraction with LVH- - NYHA class III - fluid overloaded with elevated ReDs reading, increased weight and worsening symptoms - weighing daily; reminded to call for an overnight weight gain of >2 pounds or a weekly weight gain of > 5 pounds - weight 234.2 pounds from last visit here 2 weeks ago - echo 08/23/22: EF of 60-65% along with mild LVH & mild Cory. - continue jardiance 10mg  QD - continue furosemide 40mg  to BID and potassium to BID - continue entresto 49/51mg  BID  - BMP today - ReDs clip reading today was    (last time was 42% - saw cardiology (Agbor-Etang) 09/21/22 - drinking 4-5 water bottles and 2-3 bottles of soda; reviewed keeping daily intake to 60-64 oz of fluids and use sugar free candy/ gum for a dry mouth - BNP 11/22/22 was 218.3  2: HTN- - BP - saw PCP Angelica Chessman) 12/21/22  - BMP 12/26/22 reviewed and showed sodium 135, potassium 3.6, creatinine 1.27 and GFR >60  3: DM- - A1c 12/06/22 was 6.9% - currently on trulicity, metformin and insulin - saw endocrinology 4/24  4: Reactive airway disease- - saw pulmonology (Dgayli) 02/24 - using spiriva  - hasn't smoked in ~ 3 months

## 2023-01-24 ENCOUNTER — Other Ambulatory Visit (HOSPITAL_COMMUNITY): Payer: Self-pay

## 2023-01-24 ENCOUNTER — Ambulatory Visit: Payer: Medicaid Other | Attending: Family | Admitting: Family

## 2023-01-24 ENCOUNTER — Encounter: Payer: Self-pay | Admitting: Family

## 2023-01-24 VITALS — BP 170/80 | HR 67 | Wt 235.0 lb

## 2023-01-24 DIAGNOSIS — I428 Other cardiomyopathies: Secondary | ICD-10-CM | POA: Insufficient documentation

## 2023-01-24 DIAGNOSIS — E785 Hyperlipidemia, unspecified: Secondary | ICD-10-CM | POA: Insufficient documentation

## 2023-01-24 DIAGNOSIS — Z87891 Personal history of nicotine dependence: Secondary | ICD-10-CM | POA: Diagnosis not present

## 2023-01-24 DIAGNOSIS — J45909 Unspecified asthma, uncomplicated: Secondary | ICD-10-CM | POA: Diagnosis not present

## 2023-01-24 DIAGNOSIS — I10 Essential (primary) hypertension: Secondary | ICD-10-CM | POA: Diagnosis not present

## 2023-01-24 DIAGNOSIS — I5032 Chronic diastolic (congestive) heart failure: Secondary | ICD-10-CM

## 2023-01-24 DIAGNOSIS — Z79899 Other long term (current) drug therapy: Secondary | ICD-10-CM | POA: Insufficient documentation

## 2023-01-24 DIAGNOSIS — Z794 Long term (current) use of insulin: Secondary | ICD-10-CM | POA: Diagnosis not present

## 2023-01-24 DIAGNOSIS — I509 Heart failure, unspecified: Secondary | ICD-10-CM | POA: Insufficient documentation

## 2023-01-24 DIAGNOSIS — M109 Gout, unspecified: Secondary | ICD-10-CM | POA: Diagnosis not present

## 2023-01-24 DIAGNOSIS — E119 Type 2 diabetes mellitus without complications: Secondary | ICD-10-CM

## 2023-01-24 DIAGNOSIS — I110 Hypertensive heart disease with heart failure: Secondary | ICD-10-CM | POA: Insufficient documentation

## 2023-01-24 DIAGNOSIS — I5033 Acute on chronic diastolic (congestive) heart failure: Secondary | ICD-10-CM | POA: Diagnosis not present

## 2023-01-24 MED ORDER — TORSEMIDE 20 MG PO TABS: 40.0000 mg | ORAL_TABLET | Freq: Every day | ORAL | 5 refills | Status: DC

## 2023-01-24 NOTE — Progress Notes (Signed)
[facility] - HEART FAILURE CLINIC - PHARMACIST COUNSELING NOTE  Guideline-Directed Medical Therapy/Evidence Based Medicine  ACE/ARB/ARNI: Sacubitril-valsartan 49-51 mg twice daily Beta Blocker:  None.  Aldosterone Antagonist:  None Diuretic: Furosemide 40 mg BID SGLT2i: Empagliflozin 10 mg daily  Adherence Assessment  Do you ever forget to take your medication? [] Yes [x] No  Do you ever skip doses due to side effects? [] Yes [x] No  Do you have trouble affording your medicines? [] Yes [x] No  Are you ever unable to pick up your medication due to transportation difficulties? [] Yes [x] No  Do you ever stop taking your medications because you don't believe they are helping? [] Yes [x] No  Do you check your weight daily? [x] Yes [] No   Adherence strategy: Pharmacy pill packing from [place] [facility].   Barriers to obtaining medications: None identified during this visit.   Vital signs: HR 67, BP 170-80, weight (pounds) 235 (stable per patient) ECHO: Date 08/2022, EF 60-65, notes mild LVH     Latest Ref Rng & Units 12/26/2022    2:08 PM 12/06/2022    9:54 AM 11/27/2022    3:08 PM  BMP  Glucose 70 - 99 mg/dL 492  010  071   BUN 6 - 20 mg/dL 19  16  26    Creatinine 0.61 - 1.24 mg/dL 2.19  7.58  8.32   BUN/Creat Ratio 6 - 22 (calc)  SEE NOTE:    Sodium 135 - 145 mmol/L 135  138  138   Potassium 3.5 - 5.1 mmol/L 3.6  4.2  3.8   Chloride 98 - 111 mmol/L 100  103  99   CO2 22 - 32 mmol/L 24  23  25    Calcium 8.9 - 10.3 mg/dL 8.6  9.2  9.2     Past Medical History:  Diagnosis Date   Bulging of cervical intervertebral disc    CHF (congestive heart failure)    Diabetes mellitus without complication    Gout    High cholesterol    Hypertension    Stress due to family tension 04/15/2019    ASSESSMENT 57 year old male who presents to the HF clinic for follow up appointment. Patient brought his medications in with him. Lisinopril is still in the medication  pack. I called the pharmacy and they did not fill the entresto because a PA needed to be completed. The PA was completed 01/11/23, a day after the script was sent. I had them run the script again and it was covered. The pharmacy will fix the pill pack 01/25/23. Patient had no complaints during this visit. Reds 42%. Blood pressure is   Recent ED Visit (past 6 months): Date - 11/22/2022, CC - SOB and Chest pain.   PLAN CHF/HTN - continue jardiance. Patient is currently taking lisinopril. Last dose 4/11 AM. Plan to switch to entresto 49-51 mg BID. I informed the patient to start taking entresto on 4/14 and stop taking lisinopril until then to allow for a > 36 hours wash out period. Recommend switching lasix to torsemide due to elevated Reds score and better bioavailability. Monitor blood pressure. If blood pressure remains elevated at next visit recommend to start spironolactone and potentially decreasing amlodipine and potassium supplement dose. Recommend rechecking labs at next visit.   DM - A1c improving. Continue taking trulicity and lantus and SSI as directed.   HLD - continue crestor and zetia. LDL is still elevated > 100. Patient might be a good PCSK9 candidate. Repetha will need a PA. Defer to  PCP.     Time spent: 45 minutes  Ronnald Ramp, Pharm.D. Clinical Pharmacist 01/24/2023 10:14 AM    Current Outpatient Medications:    allopurinol (ZYLOPRIM) 100 MG tablet, TAKE ONE TABLET BY MOUTH ONCE A DAY. TAKE WITH 300 MG TABLETS, Disp: 90 tablet, Rfl: 1   allopurinol (ZYLOPRIM) 300 MG tablet, Take one 300 mg tab po daily WITH 100 mg tab for total of 400 mg once daily for gout, Disp: 90 tablet, Rfl: 1   amLODipine (NORVASC) 10 MG tablet, TAKE ONE TABLET BY MOUTH DAILY, Disp: 90 tablet, Rfl: 3   DULoxetine (CYMBALTA) 30 MG capsule, TAKE ONE CAPSULE BY MOUTH TWO TIMES DAILY, Disp: 180 capsule, Rfl: 1   ezetimibe (ZETIA) 10 MG tablet, TAKE ONE TABLET BY MOUTH DAILY, Disp: 90 tablet, Rfl: 3    fluticasone (FLONASE) 50 MCG/ACT nasal spray, Place 1 spray into both nostrils daily., Disp: 18.2 mL, Rfl: 6   insulin glargine (LANTUS SOLOSTAR) 100 UNIT/ML Solostar Pen, Inject 20 Units into the skin at bedtime., Disp: , Rfl:    insulin lispro (HUMALOG) 100 UNIT/ML KwikPen, Inject 1-45 Units into the skin 3 (three) times daily. Sliding scale up to 45 units per injection, Disp: , Rfl:    ipratropium-albuterol (DUONEB) 0.5-2.5 (3) MG/3ML SOLN, Take 3 mLs by nebulization every 6 (six) hours as needed (SOB wheeze cough)., Disp: 180 mL, Rfl: 1   JARDIANCE 10 MG TABS tablet, TAKE ONE TABLET BY MOUTH DAILY BEFORE BREAKFAST, Disp: 30 tablet, Rfl: 3   levocetirizine (XYZAL) 5 MG tablet, TAKE ONE TABLET BY MOUTH EVERY EVENING, Disp: 90 tablet, Rfl: 1   lisinopril (ZESTRIL) 40 MG tablet, Take 40 mg by mouth daily., Disp: , Rfl:    metFORMIN (GLUCOPHAGE) 1000 MG tablet, TAKE ONE TABLET BY MOUTH TWO TIMES A DAY6 WITHA MEAL, Disp: 180 tablet, Rfl: 3   nicotine polacrilex (GNP NICOTINE POLACRILEX) 2 MG lozenge, TAKE ONE LOZENGE (2MG  TOTAL) BY MOUTH EVERY TWO HOURS AS NEEDED FOR SMOKING CESSATION, Disp: 27 lozenge, Rfl: 6   potassium chloride SA (KLOR-CON M) 20 MEQ tablet, Take 1 tablet (20 mEq total) by mouth 2 (two) times daily. Take with furosemide, Disp: 60 tablet, Rfl: 5   pregabalin (LYRICA) 25 MG capsule, TAKE ONE CAPSULE BY MOUTH IN THE MORNING AND ONE TO THREE CAPSULES EVERY NIGHT FOR CERVICAL RADICULOPATHY AND NEUROPATHY (Patient taking differently: 25 mg 2 (two) times daily. TAKE ONE CAPSULE BY MOUTH IN THE MORNING AND ONE CAPSULES EVERY NIGHT FOR CERVICAL RADICULOPATHY AND NEUROPATHY), Disp: 270 capsule, Rfl: 1   rosuvastatin (CRESTOR) 40 MG tablet, TAKE ONE TABLET BY MOUTH DAILY, Disp: 90 tablet, Rfl: 3   sacubitril-valsartan (ENTRESTO) 49-51 MG, Take 1 tablet by mouth 2 (two) times daily. (Patient not taking: Reported on 01/24/2023), Disp: 60 tablet, Rfl: 5   Tiotropium Bromide Monohydrate (SPIRIVA  RESPIMAT) 2.5 MCG/ACT AERS, Inhale 2 puffs into the lungs daily., Disp: 30 each, Rfl: 11   torsemide (DEMADEX) 20 MG tablet, Take 2 tablets (40 mg total) by mouth daily., Disp: 60 tablet, Rfl: 5   TRULICITY 3 MG/0.5ML SOPN, Inject into the skin., Disp: , Rfl:    VENTOLIN HFA 108 (90 Base) MCG/ACT inhaler, INHALE 2 PUFFS INTO THE LUNGS EVERY 6 HOURS AS NEEDED FOR WHEEZING OR SHORTNESS OF BREATH, Disp: 18 g, Rfl: 2 No current facility-administered medications for this visit.  Facility-Administered Medications Ordered in Other Visits:    albuterol (PROVENTIL) (2.5 MG/3ML) 0.083% nebulizer solution 2.5 mg, 2.5 mg, Nebulization, Once,  Raechel Chutegayli, Khabib, MD   COUNSELING POINTS/CLINICAL PEARLS    DRUGS TO CAUTION IN HEART FAILURE  Drug or Class Mechanism  Analgesics NSAIDs COX-2 inhibitors Glucocorticoids  Sodium and water retention, increased systemic vascular resistance, decreased response to diuretics   Diabetes Medications Metformin Thiazolidinediones Rosiglitazone (Avandia) Pioglitazone (Actos) DPP4 Inhibitors Saxagliptin (Onglyza) Sitagliptin (Januvia)   Lactic acidosis Possible calcium channel blockade   Unknown  Antiarrhythmics Class I  Flecainide Disopyramide Class III Sotalol Other Dronedarone  Negative inotrope, proarrhythmic   Proarrhythmic, beta blockade  Negative inotrope  Antihypertensives Alpha Blockers Doxazosin Calcium Channel Blockers Diltiazem Verapamil Nifedipine Central Alpha Adrenergics Moxonidine Peripheral Vasodilators Minoxidil  Increases renin and aldosterone  Negative inotrope    Possible sympathetic withdrawal  Unknown  Anti-infective Itraconazole Amphotericin B  Negative inotrope Unknown  Hematologic Anagrelide Cilostazol   Possible inhibition of PD IV Inhibition of PD III causing arrhythmias  Neurologic/Psychiatric Stimulants Anti-Seizure  Drugs Carbamazepine Pregabalin Antidepressants Tricyclics Citalopram Parkinsons Bromocriptine Pergolide Pramipexole Antipsychotics Clozapine Antimigraine Ergotamine Methysergide Appetite suppressants Bipolar Lithium  Peripheral alpha and beta agonist activity  Negative inotrope and chronotrope Calcium channel blockade  Negative inotrope, proarrhythmic Dose-dependent QT prolongation  Excessive serotonin activity/valvular damage Excessive serotonin activity/valvular damage Unknown  IgE mediated hypersensitivy, calcium channel blockade  Excessive serotonin activity/valvular damage Excessive serotonin activity/valvular damage Valvular damage  Direct myofibrillar degeneration, adrenergic stimulation  Antimalarials Chloroquine Hydroxychloroquine Intracellular inhibition of lysosomal enzymes  Urologic Agents Alpha Blockers Doxazosin Prazosin Tamsulosin Terazosin  Increased renin and aldosterone  Adapted from Page Williemae NatterL, et al. "Drugs That May Cause or Exacerbate Heart Failure: A Scientific Statement from the American Heart  Association." Circulation 2016; 134:e32-e69. DOI: 10.1161/CIR.0000000000000426   MEDICATION ADHERENCES TIPS AND STRATEGIES Taking medication as prescribed improves patient outcomes in heart failure (reduces hospitalizations, improves symptoms, increases survival) Side effects of medications can be managed by decreasing doses, switching agents, stopping drugs, or adding additional therapy. Please let someone in the Heart Failure Clinic know if you have having bothersome side effects so we can modify your regimen. Do not alter your medication regimen without talking to us.  Medication reminders can help patients remember to take drugs on time. If you are missing or forgetting doses you can try linking behaviors, using pill boxes, or an electronic reminder like an alarm on your phone or an app. Some people can also get automated phone calls as medication  reminders.

## 2023-01-24 NOTE — Patient Instructions (Signed)
We are changing your fluid pill to torsemide 40mg  once daily. You will stop taking the furosemide. I have sent the torsemide prescription to [facility] pharmacy for you.

## 2023-01-24 NOTE — Progress Notes (Signed)
   01/24/23 0915  ReDS Vest / Clip  Station Marker D  Ruler Value 38  ReDS Value Range (!) > 40  ReDS Actual Value 42

## 2023-01-25 ENCOUNTER — Encounter: Payer: Medicaid Other | Admitting: Family

## 2023-01-27 DIAGNOSIS — G4733 Obstructive sleep apnea (adult) (pediatric): Secondary | ICD-10-CM | POA: Diagnosis not present

## 2023-01-27 DIAGNOSIS — I10 Essential (primary) hypertension: Secondary | ICD-10-CM | POA: Diagnosis not present

## 2023-01-31 ENCOUNTER — Ambulatory Visit: Payer: Medicaid Other | Admitting: Podiatry

## 2023-01-31 ENCOUNTER — Encounter: Payer: Self-pay | Admitting: Podiatry

## 2023-01-31 VITALS — BP 195/96 | HR 76

## 2023-01-31 DIAGNOSIS — M79675 Pain in left toe(s): Secondary | ICD-10-CM | POA: Diagnosis not present

## 2023-01-31 DIAGNOSIS — B351 Tinea unguium: Secondary | ICD-10-CM

## 2023-01-31 DIAGNOSIS — M79674 Pain in right toe(s): Secondary | ICD-10-CM

## 2023-01-31 DIAGNOSIS — E0843 Diabetes mellitus due to underlying condition with diabetic autonomic (poly)neuropathy: Secondary | ICD-10-CM

## 2023-01-31 NOTE — Progress Notes (Signed)
This patient returns to my office for at risk foot care.  This patient requires this care by a professional since this patient will be at risk due to having diabetes and CKD..  This patient is unable to cut nails himself since the patient cannot reach his nails.These nails are painful walking and wearing shoes.  This patient presents for at risk foot care today.  General Appearance  Alert, conversant and in no acute stress.  Vascular  Dorsalis pedis and posterior tibial  pulses are palpable  bilaterally.  Capillary return is within normal limits  bilaterally. Temperature is within normal limits  bilaterally.  Neurologic  Senn-Weinstein monofilament wire test within normal limits  bilaterally. Muscle power within normal limits bilaterally.  Nails Thick disfigured discolored nails with subungual debris  from hallux to fifth toes bilaterally. No evidence of bacterial infection or drainage bilaterally.  Orthopedic  No limitations of motion  feet .  No crepitus or effusions noted.  No bony pathology or digital deformities noted.  Skin  normotropic skin with no porokeratosis noted bilaterally.  No signs of infections or ulcers noted.     Onychomycosis  Pain in right toes  Pain in left toes  Consent was obtained for treatment procedures.   Mechanical debridement of nails 1-5  bilaterally performed with a nail nipper.  Filed with dremel without incident.    Return office visit     3 months                 Told patient to return for periodic foot care and evaluation due to potential at risk complications.   Mariha Sleeper DPM  

## 2023-02-18 ENCOUNTER — Ambulatory Visit: Payer: Medicaid Other | Admitting: Family Medicine

## 2023-02-18 ENCOUNTER — Encounter: Payer: Self-pay | Admitting: Family Medicine

## 2023-02-18 VITALS — BP 180/104 | HR 78 | Temp 97.8°F | Resp 16 | Ht 67.0 in | Wt 227.0 lb

## 2023-02-18 DIAGNOSIS — T50905A Adverse effect of unspecified drugs, medicaments and biological substances, initial encounter: Secondary | ICD-10-CM

## 2023-02-18 DIAGNOSIS — G8929 Other chronic pain: Secondary | ICD-10-CM

## 2023-02-18 DIAGNOSIS — E1165 Type 2 diabetes mellitus with hyperglycemia: Secondary | ICD-10-CM

## 2023-02-18 DIAGNOSIS — I10 Essential (primary) hypertension: Secondary | ICD-10-CM | POA: Diagnosis not present

## 2023-02-18 DIAGNOSIS — N182 Chronic kidney disease, stage 2 (mild): Secondary | ICD-10-CM

## 2023-02-18 DIAGNOSIS — I5033 Acute on chronic diastolic (congestive) heart failure: Secondary | ICD-10-CM

## 2023-02-18 DIAGNOSIS — R112 Nausea with vomiting, unspecified: Secondary | ICD-10-CM

## 2023-02-18 DIAGNOSIS — Z794 Long term (current) use of insulin: Secondary | ICD-10-CM | POA: Diagnosis not present

## 2023-02-18 MED ORDER — ONDANSETRON 4 MG PO TBDP
4.0000 mg | ORAL_TABLET | Freq: Three times a day (TID) | ORAL | 0 refills | Status: DC | PRN
Start: 2023-02-18 — End: 2023-03-23

## 2023-02-18 NOTE — Progress Notes (Signed)
Patient ID: Cory Rivas, male    DOB: 12-23-1965, 57 y.o.   MRN: [id number]  PCP: Danelle Berry, PA-C  Chief Complaint  Patient presents with   Emesis    Unable to sleep, all started past Thursday after starting new med. He does not know the name of what medication it was.   Chest Pain    More like pressure feeling   Cough   Back Pain    On left side    Subjective:   SOSA PACIFICO is a 57 y.o. male, presents to clinic with CC of the following:  HPI  Multiple acute complaints today  Pt started a new med - not sure what its called (gets pill packs)  New packs and new med started on Thursday from Cardiology  It has made him very sick - N/V multiple episodes of vomiting Friday morning he felt a little better, didn't take his morning meds and was able to go out to the store for a short while, then when he came home and took his meds he started vomiting all over again. So for the past 3 d didn't take any of his meds - multiple sx back pain, neck pain, N, V, weakness, dizziness/lightheadedness discomfort in his stomach,  feels like bugs are crawling all over his skin, he continues to have some nausea and diarrhea, he has also been having some nasal congestion, postnasal drip and coughing and can just turn in his chest he states that got worse when he was out on the lawnmower and breathing in dust  Newest meds on chart from cardiology is entresto, demedex - they look like newest dispensed meds Demedex replaced lasix Pt has known chronic neck and back pain on Lyrica and Cymbalta to manage this also muscle relaxers He continues to have respiratory symptoms with both COPD and heart failure contributing  He is managed by endocrinology for his diabetes he reports the first day he got sick on Thursday his sugars were high in the 200s but have been better since then around 120s    Patient Active Problem List   Diagnosis Date Noted   Acute decompensated heart failure (HCC) 11/23/2022    Hypertensive emergency 11/22/2022   Acute respiratory failure with hypoxia (HCC) 11/22/2022   Elevated troponin 11/22/2022   CHF exacerbation (HCC) 08/24/2022   Acute on chronic diastolic congestive heart failure (HCC) 08/23/2022   Class 2 obesity 08/23/2022   Type 2 diabetes mellitus with hyperglycemia (HCC) 08/23/2022   Grade I diastolic dysfunction 06/08/2022   LVH (left ventricular hypertrophy) 06/08/2022   Accelerated hypertension 06/08/2022   S/P cervical spinal fusion 08/18/2021   Former heavy tobacco smoker 08/18/2021   Gout 08/18/2021   Other chronic pain 10/26/2020   Systolic murmur 10/26/2020   Tobacco use 10/26/2020   Polyp of ascending colon    Rectal polyp    Class 2 severe obesity with serious comorbidity and body mass index (BMI) of 39.0 to 39.9 in adult [facility]) 04/15/2019   Chronic kidney disease (CKD) stage G2/A3, mildly decreased glomerular filtration rate (GFR) between 60-89 mL/min/1.73 square meter and albuminuria creatinine ratio greater than 300 mg/g 04/15/2019   Proteinuria 04/15/2019   Hyperlipidemia associated with type 2 diabetes mellitus (HCC) 03/18/2019   Diabetes mellitus (HCC) 03/18/2019   Idiopathic chronic gout of multiple sites without tophus 03/18/2019   Essential hypertension 03/18/2019   Dermatitis 03/18/2019   Lumbar degenerative disc disease 11/22/2015   Cervical myelopathy (HCC) 11/22/2015  Osteoarthritis of spine with radiculopathy, lumbar region 11/22/2015      Current Outpatient Medications:    allopurinol (ZYLOPRIM) 100 MG tablet, TAKE ONE TABLET BY MOUTH ONCE A DAY. TAKE WITH 300 MG TABLETS, Disp: 90 tablet, Rfl: 1   allopurinol (ZYLOPRIM) 300 MG tablet, Take one 300 mg tab po daily WITH 100 mg tab for total of 400 mg once daily for gout, Disp: 90 tablet, Rfl: 1   amLODipine (NORVASC) 10 MG tablet, TAKE ONE TABLET BY MOUTH DAILY, Disp: 90 tablet, Rfl: 3   DULoxetine (CYMBALTA) 30 MG capsule, TAKE ONE CAPSULE BY MOUTH TWO TIMES DAILY,  Disp: 180 capsule, Rfl: 1   ezetimibe (ZETIA) 10 MG tablet, TAKE ONE TABLET BY MOUTH DAILY, Disp: 90 tablet, Rfl: 3   fluticasone (FLONASE) 50 MCG/ACT nasal spray, Place 1 spray into both nostrils daily., Disp: 18.2 mL, Rfl: 6   insulin glargine (LANTUS SOLOSTAR) 100 UNIT/ML Solostar Pen, Inject 20 Units into the skin at bedtime., Disp: , Rfl:    insulin lispro (HUMALOG) 100 UNIT/ML KwikPen, Inject 1-45 Units into the skin 3 (three) times daily. Sliding scale up to 45 units per injection, Disp: , Rfl:    ipratropium-albuterol (DUONEB) 0.5-2.5 (3) MG/3ML SOLN, Take 3 mLs by nebulization every 6 (six) hours as needed (SOB wheeze cough)., Disp: 180 mL, Rfl: 1   JARDIANCE 10 MG TABS tablet, TAKE ONE TABLET BY MOUTH DAILY BEFORE BREAKFAST, Disp: 30 tablet, Rfl: 3   levocetirizine (XYZAL) 5 MG tablet, TAKE ONE TABLET BY MOUTH EVERY EVENING, Disp: 90 tablet, Rfl: 1   lisinopril (ZESTRIL) 40 MG tablet, Take 40 mg by mouth daily., Disp: , Rfl:    metFORMIN (GLUCOPHAGE) 1000 MG tablet, TAKE ONE TABLET BY MOUTH TWO TIMES A DAY6 WITHA MEAL, Disp: 180 tablet, Rfl: 3   nicotine polacrilex (GNP NICOTINE POLACRILEX) 2 MG lozenge, TAKE ONE LOZENGE (2MG  TOTAL) BY MOUTH EVERY TWO HOURS AS NEEDED FOR SMOKING CESSATION, Disp: 27 lozenge, Rfl: 6   ondansetron (ZOFRAN-ODT) 4 MG disintegrating tablet, Take 1-2 tablets (4-8 mg total) by mouth every 8 (eight) hours as needed for nausea or vomiting., Disp: 20 tablet, Rfl: 0   potassium chloride SA (KLOR-CON M) 20 MEQ tablet, Take 1 tablet (20 mEq total) by mouth 2 (two) times daily. Take with furosemide, Disp: 60 tablet, Rfl: 5   pregabalin (LYRICA) 25 MG capsule, TAKE ONE CAPSULE BY MOUTH IN THE MORNING AND ONE TO THREE CAPSULES EVERY NIGHT FOR CERVICAL RADICULOPATHY AND NEUROPATHY (Patient taking differently: 25 mg 2 (two) times daily. TAKE ONE CAPSULE BY MOUTH IN THE MORNING AND ONE CAPSULES EVERY NIGHT FOR CERVICAL RADICULOPATHY AND NEUROPATHY), Disp: 270 capsule, Rfl: 1    rosuvastatin (CRESTOR) 40 MG tablet, TAKE ONE TABLET BY MOUTH DAILY, Disp: 90 tablet, Rfl: 3   sacubitril-valsartan (ENTRESTO) 49-51 MG, Take 1 tablet by mouth 2 (two) times daily., Disp: 60 tablet, Rfl: 5   Tiotropium Bromide Monohydrate (SPIRIVA RESPIMAT) 2.5 MCG/ACT AERS, Inhale 2 puffs into the lungs daily., Disp: 30 each, Rfl: 11   torsemide (DEMADEX) 20 MG tablet, Take 2 tablets (40 mg total) by mouth daily., Disp: 60 tablet, Rfl: 5   TRULICITY 3 MG/0.5ML SOPN, Inject into the skin., Disp: , Rfl:    VENTOLIN HFA 108 (90 Base) MCG/ACT inhaler, INHALE 2 PUFFS INTO THE LUNGS EVERY 6 HOURS AS NEEDED FOR WHEEZING OR SHORTNESS OF BREATH, Disp: 18 g, Rfl: 2 No current facility-administered medications for this visit.  Facility-Administered Medications Ordered in Other Visits:  albuterol (PROVENTIL) (2.5 MG/3ML) 0.083% nebulizer solution 2.5 mg, 2.5 mg, Nebulization, Once, Raechel Chute, MD   Allergies  Allergen Reactions   Oxycodone Nausea And Vomiting   Onion Nausea And Vomiting     Social History   Tobacco Use   Smoking status: Some Days    Packs/day: 1.00    Years: 37.00    Additional pack years: 0.00    Total pack years: 37.00    Types: Cigarettes    Start date: 10/19/1989    Last attempt to quit: 08/20/2022    Years since quitting: 0.4   Smokeless tobacco: Never   Tobacco comments:    30+ years hx smoking 2 ppd, stopped smoking briefly in April 2022 for a surgery and then restarted smoking about 1/2 ppd last 4-5 months     Quit smoking 08/20/2022  Vaping Use   Vaping Use: Never used  Substance Use Topics   Alcohol use: No   Drug use: No      Chart Review Today: I personally reviewed active problem list, medication list, allergies, family history, social history, health maintenance, notes from last encounter, lab results, imaging with the patient/caregiver today.   Review of Systems  Constitutional: Negative.   HENT: Negative.    Eyes: Negative.   Respiratory:  Negative.    Cardiovascular: Negative.   Gastrointestinal: Negative.   Endocrine: Negative.   Genitourinary: Negative.   Musculoskeletal: Negative.   Skin: Negative.   Allergic/Immunologic: Negative.   Neurological: Negative.   Hematological: Negative.   Psychiatric/Behavioral: Negative.    All other systems reviewed and are negative.      Objective:   Vitals:   02/18/23 1539 02/18/23 1615  BP: (!) 178/100 (!) 180/104  Pulse: 78   Resp: 16   Temp: 97.8 F (36.6 C)   TempSrc: Oral   SpO2: 97%   Weight: 227 lb (103 kg)   Height: 5\' 7"  (1.702 m)     Body mass index is 35.55 kg/m.  Physical Exam Vitals and nursing note reviewed.  Constitutional:      General: He is not in acute distress.    Appearance: He is obese. He is not ill-appearing, toxic-appearing or diaphoretic.     Comments: Patient appears uncomfortable, nontoxic, NAD  HENT:     Head: Normocephalic and atraumatic.     Right Ear: External ear normal.     Left Ear: External ear normal.     Mouth/Throat:     Mouth: Mucous membranes are moist.     Pharynx: Oropharynx is clear.  Eyes:     General:        Right eye: No discharge.        Left eye: No discharge.     Conjunctiva/sclera: Conjunctivae normal.  Cardiovascular:     Rate and Rhythm: Normal rate and regular rhythm.     Heart sounds: Murmur heard.     No friction rub. No gallop.  Pulmonary:     Comments: BS difficult to hear throughout no rales, rhonchi or wheeze -when encouraged and when demonstrating deep inspiration his breath sounds are normal bilaterally with posterior auscultation Able to speak in full but short sentences, no coughing during exam, no accessory muscle use, retractions or tachypnea Abdominal:     General: Abdomen is protuberant. Bowel sounds are decreased.  Musculoskeletal:     Right lower leg: No edema.     Left lower leg: No edema.  Neurological:     Mental Status: Mental status is  at baseline.     Gait: Gait normal.   Psychiatric:        Mood and Affect: Mood normal.        Behavior: Behavior normal.      Results for orders placed or performed during the hospital encounter of 12/26/22  Basic metabolic panel  Result Value Ref Range   Sodium 135 135 - 145 mmol/L   Potassium 3.6 3.5 - 5.1 mmol/L   Chloride 100 98 - 111 mmol/L   CO2 24 22 - 32 mmol/L   Glucose, Bld 264 (H) 70 - 99 mg/dL   BUN 19 6 - 20 mg/dL   Creatinine, Ser 1.61 (H) 0.61 - 1.24 mg/dL   Calcium 8.6 (L) 8.9 - 10.3 mg/dL   GFR, Estimated >09 >60 mL/min   Anion gap 11 5 - 15       Assessment & Plan:     ICD-10-CM   1. Nausea and vomiting, unspecified vomiting type  R11.2 ondansetron (ZOFRAN-ODT) 4 MG disintegrating tablet    Comprehensive metabolic panel   zofran as prescribed, sip fluids and slow advancement of diet    2. Adverse effect of drug, initial encounter  T50.905A Comprehensive metabolic panel   some new meds with pill pack, severe N/V right when he took first dose - will have to figure out meds with pharmacy    3. Acute on chronic diastolic congestive heart failure (HCC)  I50.33    severe reaction vs acute GI illness with recent med changes from cardiology - Rx on 4/11, but pt only started new meds last thursday 5/2    4. Accelerated hypertension  I10 Comprehensive metabolic panel   see plan below    5. Other chronic pain  G89.29    back and neck pain worse, baseline pain fairly severe, hope to get him restarted on cymbalta and lyrica - suspect pain will go back to baseline    6. Acute on chronic heart failure with preserved ejection fraction (HCC)  I50.33    BP uncontrolled, off diuretics and new entresto - working to get him back on meds, today lungs CTA, no LE edema, with N/V/D likely some mild hypovolemia    7. Chronic kidney disease (CKD) stage G2/A3, mildly decreased glomerular filtration rate (GFR) between 60-89 mL/min/1.73 square meter and albuminuria creatinine ratio greater than 300 mg/g  N18.2    will  get CMP rechecked    8. Essential hypertension  I10    uncontrolled but hasn't been able to take his meds for 3 days, plan to get him back on meds, will be holding entresto, BP may still be high, recheck 3d    9. Uncontrolled diabetes mellitus with hyperglycemia, with long-term current use of insulin (HCC)  E11.65    Z79.4    monitor sugars, for now hold metformin new pill/dose and once GI upset calms down, restart metformin slowly with 1 pill at a time with meals     Patient was encouraged to take his pill pack to [facility] pharmacy to discuss with them and get their assistance on pulling back out the brand-new medications.  He states there are no pictures of what the pills or not drug names are so he has no idea which medications are what From what I can tell on the chart for med adherence and med dispenses it looks like Entresto and Demadex are the newest medications given and recently changed per cardiology  I would suspect Entresto causing more med side effects  than the diuretic since he is been on Lasix for quite some time - 4/11 cardiology changed diuretic however pt did not get it in his pill back until recently? Since pt does not know the meds by site and he does not have his pills here with him I will be calling his pharmacy to review recent pill pack changes and ask for their assistance  His blood pressure is elevated and uncontrolled I suspect this to improve when he is back on most of his regular hypertensive urine and heart medications He is having various side effects including dizziness, feeling out of it, having bugs crawling on his skin and I am concerned that he may also be having some withdrawal symptoms from his other meds like Cymbalta and Lyrica -and I explained to him that I would like him to take the antinausea medicines first make sure he is able to sip fluids and then take his nighttime medications once the pharmacy has had time to go through them and pull out  Entresto We may try to make Demadex separate as well -but hopefully he will be able to tolerate his diuretic with his elevated blood pressure - hope to avoid CHF exacerbation again Close f/up in office in 2-3 d    Danelle Berry, PA-C 02/18/23 4:35 PM    4:47 PM  On phone with [facility] With Morrie Sheldon and pt has just walked into the pharmacy as well  She states the pharmacy took out lisinopril when putting in entresto  They took out lasix and put in demedex Endocrinology also change metformin from 1000 mg BID to metformin XR 500 mg tabs, take 2000 mg po daily with dinner  Pharmacy staff will pull out entresto and the metformin  Will have him take metformin 1000 mg BID with food after GI upset improves See if continuing demedex and other home meds is tolerated.  Will f/up with pt in 2-3 days  If the severe SE may be coming from entresto then I will notify and coordinate with cardiology - I attempted to reach out to [facility] NP but she is offline for the week.    Called pt and his sister [name] to go over the plan for now - they verbalized understanding - given a chance to ask all questions   Danelle Berry, PA-C

## 2023-02-21 ENCOUNTER — Ambulatory Visit: Payer: Medicaid Other | Attending: Cardiology | Admitting: Cardiology

## 2023-02-21 ENCOUNTER — Encounter: Payer: Self-pay | Admitting: Cardiology

## 2023-02-21 VITALS — BP 174/94 | HR 63 | Wt 224.8 lb

## 2023-02-21 DIAGNOSIS — I10 Essential (primary) hypertension: Secondary | ICD-10-CM | POA: Diagnosis not present

## 2023-02-21 DIAGNOSIS — I5032 Chronic diastolic (congestive) heart failure: Secondary | ICD-10-CM

## 2023-02-21 DIAGNOSIS — E782 Mixed hyperlipidemia: Secondary | ICD-10-CM

## 2023-02-21 MED ORDER — LISINOPRIL 40 MG PO TABS: 40.0000 mg | ORAL_TABLET | Freq: Every day | ORAL | 3 refills | Status: DC

## 2023-02-21 MED ORDER — CARVEDILOL 6.25 MG PO TABS: 6.2500 mg | ORAL_TABLET | Freq: Two times a day (BID) | ORAL | 3 refills | Status: DC

## 2023-02-21 NOTE — Patient Instructions (Signed)
Medication Instructions:   STOP Entresto START Lisinopril - Take one tablet ( 40mg ) by mouth daily.  START Carvedilol - Take one tablet ( 6.25mg ) by mouth daily.   *If you need a refill on your cardiac medications before your next appointment, please call your pharmacy*   Lab Work:  None ordered  If you have labs (blood work) drawn today and your tests are completely normal, you will receive your results only by: MyChart Message (if you have MyChart) OR A paper copy in the mail If you have any lab test that is abnormal or we need to change your treatment, we will call you to review the results.   Testing/Procedures:  None Ordered   Follow-Up: At [facility], you and your health needs are our priority.  As part of our continuing mission to provide you with exceptional heart care, we have created designated Provider Care Teams.  These Care Teams include your primary Cardiologist (physician) and Advanced Practice Providers (APPs -  Physician Assistants and Nurse Practitioners) who all work together to provide you with the care you need, when you need it.  We recommend signing up for the patient portal called "MyChart".  Sign up information is provided on this After Visit Summary.  MyChart is used to connect with patients for Virtual Visits (Telemedicine).  Patients are able to view lab/test results, encounter notes, upcoming appointments, etc.  Non-urgent messages can be sent to your provider as well.   To learn more about what you can do with MyChart, go to [email or web address].    Your next appointment:   6 week(s)  Provider:   Debbe Odea, MD ONLY

## 2023-02-21 NOTE — Progress Notes (Signed)
Cardiology Office Note:    Date:  02/21/2023   ID:  Cory Rivas, DOB November 30, 1965, MRN [id number]  PCP:  Cory Berry, PA-C  [facility] Cardiologist:  Cory Odea, MD  [facility] Electrophysiologist:  None   Referring MD: Cory Berry, PA-C   Chief Complaint  Patient presents with   Follow-up    Patient had an episode of chest pressure last week.  Has been going thru some medication changes with Cory Rivas but is unsure of medications.  Was last prescribed Toresemide that he was unable to tolerate so stopped.  BP in office check 174/94    History of Present Illness:    Cory Rivas is a 57 y.o. male with a hx of HFpEF, hypertension, diabetes, hyperlipidemia,smoker x35+ years who presents for follow-up.    Recently evaluated at heart failure clinic, medications adjusted, Cory Rivas was started.  He states Cory Rivas made him sick, felt nauseous.  Was previously on lisinopril.  Edema adequately controlled on current dose of torsemide 40 mg daily.  Blood pressures are significantly elevated with systolics 170s.  Denies chest pain, denies shortness of breath   Prior notes Echo 08/2022 EF 60 to 65%, grade 2 diastolic dysfunction Echo 10/2020 EF 60 to 65%, aortic sclerosis Lexiscan Myoview 09/2020 no significant ischemia, low risk study   Past Medical History:  Diagnosis Date   Bulging of cervical intervertebral disc    CHF (congestive heart failure) (HCC)    Diabetes mellitus without complication (HCC)    Gout    High cholesterol    Hypertension    Stress due to family tension 04/15/2019    Past Surgical History:  Procedure Laterality Date   CHOLECYSTECTOMY     COLONOSCOPY WITH PROPOFOL N/A 03/10/2020   Procedure: COLONOSCOPY WITH PROPOFOL;  Surgeon: Midge Minium, MD;  Location: [facility] ENDOSCOPY;  Service: Endoscopy;  Laterality: N/A;    Current Medications: Current Meds  Medication Sig   allopurinol (ZYLOPRIM) 100 MG tablet TAKE ONE TABLET BY MOUTH ONCE A  DAY. TAKE WITH 300 MG TABLETS   allopurinol (ZYLOPRIM) 300 MG tablet Take one 300 mg tab po daily WITH 100 mg tab for total of 400 mg once daily for gout   amLODipine (NORVASC) 10 MG tablet TAKE ONE TABLET BY MOUTH DAILY   carvedilol (COREG) 6.25 MG tablet Take 1 tablet (6.25 mg total) by mouth 2 (two) times daily.   DULoxetine (CYMBALTA) 30 MG capsule TAKE ONE CAPSULE BY MOUTH TWO TIMES DAILY   ezetimibe (ZETIA) 10 MG tablet TAKE ONE TABLET BY MOUTH DAILY   fluticasone (FLONASE) 50 MCG/ACT nasal spray Place 1 spray into both nostrils daily.   insulin glargine (LANTUS SOLOSTAR) 100 UNIT/ML Solostar Pen Inject 20 Units into the skin at bedtime.   insulin lispro (HUMALOG) 100 UNIT/ML KwikPen Inject 1-45 Units into the skin 3 (three) times daily. Sliding scale up to 45 units per injection   ipratropium-albuterol (DUONEB) 0.5-2.5 (3) MG/3ML SOLN Take 3 mLs by nebulization every 6 (six) hours as needed (SOB wheeze cough).   JARDIANCE 10 MG TABS tablet TAKE ONE TABLET BY MOUTH DAILY BEFORE BREAKFAST   levocetirizine (XYZAL) 5 MG tablet TAKE ONE TABLET BY MOUTH EVERY EVENING   lisinopril (ZESTRIL) 40 MG tablet Take 1 tablet (40 mg total) by mouth daily.   metFORMIN (GLUCOPHAGE) 1000 MG tablet TAKE ONE TABLET BY MOUTH TWO TIMES A DAY6 WITHA MEAL   nicotine polacrilex (GNP NICOTINE POLACRILEX) 2 MG lozenge TAKE ONE LOZENGE (2MG  TOTAL) BY MOUTH  EVERY TWO HOURS AS NEEDED FOR SMOKING CESSATION   ondansetron (ZOFRAN-ODT) 4 MG disintegrating tablet Take 1-2 tablets (4-8 mg total) by mouth every 8 (eight) hours as needed for nausea or vomiting.   potassium chloride SA (KLOR-CON M) 20 MEQ tablet Take 1 tablet (20 mEq total) by mouth 2 (two) times daily. Take with furosemide   pregabalin (LYRICA) 25 MG capsule TAKE ONE CAPSULE BY MOUTH IN THE MORNING AND ONE TO THREE CAPSULES EVERY NIGHT FOR CERVICAL RADICULOPATHY AND NEUROPATHY (Patient taking differently: 25 mg 2 (two) times daily. TAKE ONE CAPSULE BY MOUTH IN THE  MORNING AND ONE CAPSULES EVERY NIGHT FOR CERVICAL RADICULOPATHY AND NEUROPATHY)   rosuvastatin (CRESTOR) 40 MG tablet TAKE ONE TABLET BY MOUTH DAILY   Tiotropium Bromide Monohydrate (SPIRIVA RESPIMAT) 2.5 MCG/ACT AERS Inhale 2 puffs into the lungs daily.   TRULICITY 3 MG/0.5ML SOPN Inject into the skin.   VENTOLIN HFA 108 (90 Base) MCG/ACT inhaler INHALE 2 PUFFS INTO THE LUNGS EVERY 6 HOURS AS NEEDED FOR WHEEZING OR SHORTNESS OF BREATH   [DISCONTINUED] sacubitril-valsartan (ENTRESTO) 49-51 MG Take 1 tablet by mouth 2 (two) times daily.     Allergies:   Oxycodone and Onion   Social History   Socioeconomic History   Marital status: Divorced    Spouse name: Not on file   Number of children: 0   Years of education: 9   Highest education level: 9th grade  Occupational History   Occupation: disability  Tobacco Use   Smoking status: Some Days    Packs/day: 1.00    Years: 37.00    Additional pack years: 0.00    Total pack years: 37.00    Types: Cigarettes    Start date: 10/19/1989    Last attempt to quit: 08/20/2022    Years since quitting: 0.5   Smokeless tobacco: Never   Tobacco comments:    30+ years hx smoking 2 ppd, stopped smoking briefly in April 2022 for a surgery and then restarted smoking about 1/2 ppd last 4-5 months     Quit smoking 08/20/2022  Vaping Use   Vaping Use: Never used  Substance and Sexual Activity   Alcohol use: No   Drug use: No   Sexual activity: Not Currently    Partners: Female  Other Topics Concern   Not on file  Social History Narrative   Lives with sister Cory Rivas, brother in law Cory Rivas), and his mother - Cory Rivas   Social Determinants of Health   Financial Resource Strain: Medium Risk (12/06/2022)   Overall Financial Resource Strain (CARDIA)    Difficulty of Paying Living Expenses: Somewhat hard  Food Insecurity: Food Insecurity Present (12/06/2022)   Hunger Vital Sign    Worried About Running Out of Food in the Last Year: Often true    Ran Out  of Food in the Last Year: Often true  Transportation Needs: No Transportation Needs (12/06/2022)   PRAPARE - Administrator, Civil Service (Medical): No    Lack of Transportation (Non-Medical): No  Physical Activity: Insufficiently Active (12/06/2022)   Exercise Vital Sign    Days of Exercise per Week: 3 days    Minutes of Exercise per Session: 10 min  Stress: No Stress Concern Present (12/06/2022)   [employer] of Occupational Health - Occupational Stress Questionnaire    Feeling of Stress : Only a little  Social Connections: Socially Isolated (12/06/2022)   Social Connection and Isolation Panel [NHANES]    Frequency of Communication with  Friends and Family: More than three times a week    Frequency of Social Gatherings with Friends and Family: Twice a week    Attends Religious Services: Never    Database administrator or Organizations: No    Attends Engineer, structural: Never    Marital Status: Divorced     Family History: The patient's family history includes AAA (abdominal aortic aneurysm) in his maternal grandmother; Diabetes in his maternal grandfather and mother; Heart attack in his maternal grandmother and mother; Heart disease in his mother; Hyperlipidemia in his mother; Hypertension in his father and mother; Lung cancer in his father; Stroke in his father.  ROS:   Please see the history of present illness.     All other systems reviewed and are negative.  EKGs/Labs/Other Studies Reviewed:    The following studies were reviewed today:   EKG:  EKG not ordered today.   Recent Labs: 11/22/2022: B Natriuretic Peptide 218.3 12/06/2022: ALT 13; Hemoglobin 15.6; Platelets 259 12/26/2022: BUN 19; Creatinine, Ser 1.27; Potassium 3.6; Sodium 135  Recent Lipid Panel    Component Value Date/Time   CHOL 223 (H) 12/06/2022 0954   TRIG 365 (H) 12/06/2022 0954   HDL 43 12/06/2022 0954   CHOLHDL 5.2 (H) 12/06/2022 0954   VLDL 53 (H) 06/08/2022 1534   LDLCALC  128 (H) 12/06/2022 0954   LDLDIRECT 135 (H) 08/18/2021 1438     Risk Assessment/Calculations:      Physical Exam:    VS:  BP (!) 174/94 (BP Location: Left Arm, Patient Position: Sitting, Cuff Size: Normal)   Pulse 63   Wt 224 lb 12.8 oz (102 kg)   SpO2 96%   BMI 35.21 kg/m     Wt Readings from Last 3 Encounters:  02/21/23 224 lb 12.8 oz (102 kg)  02/18/23 227 lb (103 kg)  01/24/23 235 lb (106.6 kg)     GEN:  Well nourished, well developed in no acute distress HEENT: Normal NECK: No JVD; No carotid bruits CARDIAC: RRR, 2/6 systolic murmur loudest at left lower sternal border.  RESPIRATORY:  Clear to auscultation without rales, wheezing or rhonchi  ABDOMEN: Soft, non-tender, non-distended MUSCULOSKELETAL:  No edema; No deformity  SKIN: Warm and dry NEUROLOGIC:  Alert and oriented x 3 PSYCHIATRIC:  Normal affect   ASSESSMENT:    1. Chronic diastolic congestive heart failure (HCC)   2. Primary hypertension   3. Mixed hyperlipidemia    PLAN:    In order of problems listed above:  HFpEF, edema controlled.  Continue torsemide 40 mg daily. Hypertension, BP elevated.  Does not tolerate Entresto.  Stop Entresto.  Restart lisinopril 40 mg daily, start Coreg 6.25 mg twice daily.  Continue hydralazine 100 mg 3 times daily, amlodipine 10 mg daily.   Hyperlipidemia, continue Crestor 40 mg daily.   Follow-up in 6 weeks.  Medication Adjustments/Labs and Tests Ordered: Current medicines are reviewed at length with the patient today.  Concerns regarding medicines are outlined above.  Orders Placed This Encounter  Procedures   EKG 12-Lead   Meds ordered this encounter  Medications   lisinopril (ZESTRIL) 40 MG tablet    Sig: Take 1 tablet (40 mg total) by mouth daily.    Dispense:  90 tablet    Refill:  3   carvedilol (COREG) 6.25 MG tablet    Sig: Take 1 tablet (6.25 mg total) by mouth 2 (two) times daily.    Dispense:  180 tablet    Refill:  3    Patient Instructions   Medication Instructions:   STOP Entresto START Lisinopril - Take one tablet ( 40mg ) by mouth daily.  START Carvedilol - Take one tablet ( 6.25mg ) by mouth daily.   *If you need a refill on your cardiac medications before your next appointment, please call your pharmacy*   Lab Work:  None ordered  If you have labs (blood work) drawn today and your tests are completely normal, you will receive your results only by: MyChart Message (if you have MyChart) OR A paper copy in the mail If you have any lab test that is abnormal or we need to change your treatment, we will call you to review the results.   Testing/Procedures:  None Ordered   Follow-Up: At [facility], you and your health needs are our priority.  As part of our continuing mission to provide you with exceptional heart care, we have created designated Provider Care Teams.  These Care Teams include your primary Cardiologist (physician) and Advanced Practice Providers (APPs -  Physician Assistants and Nurse Practitioners) who all work together to provide you with the care you need, when you need it.  We recommend signing up for the patient portal called "MyChart".  Sign up information is provided on this After Visit Summary.  MyChart is used to connect with patients for Virtual Visits (Telemedicine).  Patients are able to view lab/test results, encounter notes, upcoming appointments, etc.  Non-urgent messages can be sent to your provider as well.   To learn more about what you can do with MyChart, go to [email or web address].    Your next appointment:   6 week(s)  Provider:   Debbe Odea, MD ONLY       Signed, Cory Odea, MD  02/21/2023 12:39 PM    [facility]

## 2023-02-21 NOTE — Progress Notes (Signed)
Patient ID: CAL LISOWSKI, male    DOB: 04-29-1966, 57 y.o.   MRN: [id number]  PCP: Danelle Berry, PA-C  Chief Complaint  Patient presents with   Follow-up    Pt feeling better but still lightheaded and chest pressure    Subjective:   Cory Rivas is a 57 y.o. male, presents to clinic with CC of the following:  HPI  Recheck after possible med rxn vs GI illness, onset 8 d ago, meds adjusted with help of pharmacy and pt was going to hold entresto and slowly resume meds Also avoid taking all metformin at once - split dosing bid with meals once GI sx improving  He had been ill, vomiting, with sore neck, throat and back, his BP was high - hadn't taken meds all weekend  Here for f/up Went to cardiology yesterday - they changed his meds- he hasn't taken them all yet - he will go back to the pharmacy to adjust pill packs BP still high today but getting better, he is taking demedex and urinating a lot and that seems to have helped overall with his breathing BP Readings from Last 3 Encounters:  02/22/23 (!) 150/82  02/21/23 (!) 174/94  02/18/23 (!) 180/104     Chronic neck pain with surgeries/injuries/concussions released from [facility] On cymbalta, lyrica and sometimes needs muscle relaxers - neck is still hurting since his severe vomiting last week - shoots pain to upper shoulders and back of head Taking aleve, doing heat and trying to avoid immobility for long periods of time  Patient Active Problem List   Diagnosis Date Noted   Acute decompensated heart failure (HCC) 11/23/2022   Hypertensive emergency 11/22/2022   Acute respiratory failure with hypoxia (HCC) 11/22/2022   Elevated troponin 11/22/2022   CHF exacerbation (HCC) 08/24/2022   Acute on chronic diastolic congestive heart failure (HCC) 08/23/2022   Class 2 obesity 08/23/2022   Type 2 diabetes mellitus with hyperglycemia (HCC) 08/23/2022   Grade I diastolic dysfunction 06/08/2022   LVH (left ventricular  hypertrophy) 06/08/2022   Accelerated hypertension 06/08/2022   S/P cervical spinal fusion 08/18/2021   Former heavy tobacco smoker 08/18/2021   Gout 08/18/2021   Other chronic pain 10/26/2020   Systolic murmur 10/26/2020   Tobacco use 10/26/2020   Polyp of ascending colon    Rectal polyp    Class 2 severe obesity with serious comorbidity and body mass index (BMI) of 39.0 to 39.9 in adult [facility]) 04/15/2019   Chronic kidney disease (CKD) stage G2/A3, mildly decreased glomerular filtration rate (GFR) between 60-89 mL/min/1.73 square meter and albuminuria creatinine ratio greater than 300 mg/g 04/15/2019   Proteinuria 04/15/2019   Hyperlipidemia associated with type 2 diabetes mellitus (HCC) 03/18/2019   Diabetes mellitus (HCC) 03/18/2019   Idiopathic chronic gout of multiple sites without tophus 03/18/2019   Essential hypertension 03/18/2019   Dermatitis 03/18/2019   Lumbar degenerative disc disease 11/22/2015   Cervical myelopathy (HCC) 11/22/2015   Osteoarthritis of spine with radiculopathy, lumbar region 11/22/2015      Current Outpatient Medications:    allopurinol (ZYLOPRIM) 100 MG tablet, TAKE ONE TABLET BY MOUTH ONCE A DAY. TAKE WITH 300 MG TABLETS, Disp: 90 tablet, Rfl: 1   allopurinol (ZYLOPRIM) 300 MG tablet, Take one 300 mg tab po daily WITH 100 mg tab for total of 400 mg once daily for gout, Disp: 90 tablet, Rfl: 1   amLODipine (NORVASC) 10 MG tablet, TAKE ONE TABLET BY MOUTH DAILY, Disp: 90  tablet, Rfl: 3   Baclofen 5 MG TABS, Take 1-2 tablets (5-10 mg total) by mouth 3 (three) times daily as needed (msk pain or spasms)., Disp: 90 tablet, Rfl: 3   carvedilol (COREG) 6.25 MG tablet, Take 1 tablet (6.25 mg total) by mouth 2 (two) times daily., Disp: 180 tablet, Rfl: 3   DULoxetine (CYMBALTA) 30 MG capsule, TAKE ONE CAPSULE BY MOUTH TWO TIMES DAILY, Disp: 180 capsule, Rfl: 1   ezetimibe (ZETIA) 10 MG tablet, TAKE ONE TABLET BY MOUTH DAILY, Disp: 90 tablet, Rfl: 3   fluticasone  (FLONASE) 50 MCG/ACT nasal spray, Place 1 spray into both nostrils daily., Disp: 18.2 mL, Rfl: 6   insulin glargine (LANTUS SOLOSTAR) 100 UNIT/ML Solostar Pen, Inject 20 Units into the skin at bedtime., Disp: , Rfl:    insulin lispro (HUMALOG) 100 UNIT/ML KwikPen, Inject 1-45 Units into the skin 3 (three) times daily. Sliding scale up to 45 units per injection, Disp: , Rfl:    ipratropium-albuterol (DUONEB) 0.5-2.5 (3) MG/3ML SOLN, Take 3 mLs by nebulization every 6 (six) hours as needed (SOB wheeze cough)., Disp: 180 mL, Rfl: 1   JARDIANCE 10 MG TABS tablet, TAKE ONE TABLET BY MOUTH DAILY BEFORE BREAKFAST, Disp: 30 tablet, Rfl: 3   levocetirizine (XYZAL) 5 MG tablet, TAKE ONE TABLET BY MOUTH EVERY EVENING, Disp: 90 tablet, Rfl: 1   lisinopril (ZESTRIL) 40 MG tablet, Take 1 tablet (40 mg total) by mouth daily., Disp: 90 tablet, Rfl: 3   metFORMIN (GLUCOPHAGE) 1000 MG tablet, TAKE ONE TABLET BY MOUTH TWO TIMES A DAY6 WITHA MEAL, Disp: 180 tablet, Rfl: 3   nicotine polacrilex (GNP NICOTINE POLACRILEX) 2 MG lozenge, TAKE ONE LOZENGE (2MG  TOTAL) BY MOUTH EVERY TWO HOURS AS NEEDED FOR SMOKING CESSATION, Disp: 27 lozenge, Rfl: 6   ondansetron (ZOFRAN-ODT) 4 MG disintegrating tablet, Take 1-2 tablets (4-8 mg total) by mouth every 8 (eight) hours as needed for nausea or vomiting., Disp: 20 tablet, Rfl: 0   potassium chloride SA (KLOR-CON M) 20 MEQ tablet, Take 1 tablet (20 mEq total) by mouth 2 (two) times daily. Take with furosemide, Disp: 60 tablet, Rfl: 5   pregabalin (LYRICA) 50 MG capsule, Take 1 capsule (50 mg total) by mouth 2 (two) times daily., Disp: 60 capsule, Rfl: 2   rosuvastatin (CRESTOR) 40 MG tablet, TAKE ONE TABLET BY MOUTH DAILY, Disp: 90 tablet, Rfl: 3   Tiotropium Bromide Monohydrate (SPIRIVA RESPIMAT) 2.5 MCG/ACT AERS, Inhale 2 puffs into the lungs daily., Disp: 30 each, Rfl: 11   torsemide (DEMADEX) 20 MG tablet, Take 2 tablets (40 mg total) by mouth daily., Disp: 60 tablet, Rfl: 5    TRULICITY 3 MG/0.5ML SOPN, Inject into the skin., Disp: , Rfl:    VENTOLIN HFA 108 (90 Base) MCG/ACT inhaler, INHALE 2 PUFFS INTO THE LUNGS EVERY 6 HOURS AS NEEDED FOR WHEEZING OR SHORTNESS OF BREATH, Disp: 18 g, Rfl: 2 No current facility-administered medications for this visit.  Facility-Administered Medications Ordered in Other Visits:    albuterol (PROVENTIL) (2.5 MG/3ML) 0.083% nebulizer solution 2.5 mg, 2.5 mg, Nebulization, Once, Dgayli, Khabib, MD   Allergies  Allergen Reactions   Oxycodone Nausea And Vomiting   Onion Nausea And Vomiting     Social History   Tobacco Use   Smoking status: Some Days    Packs/day: 1.00    Years: 37.00    Additional pack years: 0.00    Total pack years: 37.00    Types: Cigarettes    Start date:  10/19/1989    Last attempt to quit: 08/20/2022    Years since quitting: 0.5   Smokeless tobacco: Never   Tobacco comments:    30+ years hx smoking 2 ppd, stopped smoking briefly in April 2022 for a surgery and then restarted smoking about 1/2 ppd last 4-5 months     Quit smoking 08/20/2022  Vaping Use   Vaping Use: Never used  Substance Use Topics   Alcohol use: No   Drug use: No      Chart Review Today: I personally reviewed active problem list, medication list, allergies, family history, social history, health maintenance, notes from last encounter, lab results, imaging with the patient/caregiver today.   Review of Systems  Constitutional: Negative.   HENT: Negative.    Eyes: Negative.   Respiratory: Negative.    Cardiovascular: Negative.   Gastrointestinal: Negative.   Endocrine: Negative.   Genitourinary: Negative.   Musculoskeletal: Negative.   Skin: Negative.   Allergic/Immunologic: Negative.   Neurological: Negative.   Hematological: Negative.   Psychiatric/Behavioral: Negative.    All other systems reviewed and are negative.      Objective:   Vitals:   02/22/23 1118 02/22/23 1154  BP: (!) 170/92 (!) 150/82  Pulse: 99    Resp: 16   Temp: (!) 97.4 F (36.3 C)   TempSrc: Oral   SpO2: 97%   Weight: 225 lb (102.1 kg)   Height: 5\' 7"  (1.702 m)     Body mass index is 35.24 kg/m.  Physical Exam Vitals and nursing note reviewed.  Constitutional:      General: He is not in acute distress.    Appearance: He is obese. He is not ill-appearing, toxic-appearing or diaphoretic.     Comments: Patient appears uncomfortable, nontoxic, NAD  HENT:     Head: Normocephalic and atraumatic.     Right Ear: External ear normal.     Left Ear: External ear normal.     Mouth/Throat:     Mouth: Mucous membranes are moist.     Pharynx: Oropharynx is clear.  Eyes:     General:        Right eye: No discharge.        Left eye: No discharge.     Conjunctiva/sclera: Conjunctivae normal.  Neck:     Trachea: Trachea and phonation normal.  Cardiovascular:     Rate and Rhythm: Normal rate and regular rhythm.     Heart sounds: Murmur heard.     No friction rub. No gallop.  Pulmonary:     Comments: BS difficult to hear throughout no rales, rhonchi or wheeze -when encouraged and when demonstrating deep inspiration his breath sounds are normal bilaterally with posterior auscultation Able to speak in full but short sentences, no coughing during exam, no accessory muscle use, retractions or tachypnea Abdominal:     General: Abdomen is protuberant. Bowel sounds are decreased.  Musculoskeletal:     Cervical back: Rigidity present. Edema: baseline.Decreased range of motion.     Right lower leg: No edema.     Left lower leg: No edema.  Neurological:     Mental Status: Mental status is at baseline.     Gait: Gait normal.  Psychiatric:        Mood and Affect: Mood normal.        Behavior: Behavior normal.      Results for orders placed or performed during the hospital encounter of 12/26/22  Basic metabolic panel  Result Value Ref Range  Sodium 135 135 - 145 mmol/L   Potassium 3.6 3.5 - 5.1 mmol/L   Chloride 100 98 - 111  mmol/L   CO2 24 22 - 32 mmol/L   Glucose, Bld 264 (H) 70 - 99 mg/dL   BUN 19 6 - 20 mg/dL   Creatinine, Ser 5.40 (H) 0.61 - 1.24 mg/dL   Calcium 8.6 (L) 8.9 - 10.3 mg/dL   GFR, Estimated >98 >11 mL/min   Anion gap 11 5 - 15       Assessment & Plan:     ICD-10-CM   1. Adverse effect of drug, initial encounter  T50.905A    he consulted with cardiology who agrees it may be entresto, med d/c    2. Acute on chronic diastolic congestive heart failure (HCC)  I50.33    per cardiology d/c entresto, added carvedilol and restart lisinopril    3. Accelerated hypertension  I10    needs to recheck BP in 3-4 weeks after all med adjustments have happened and have time to work    4. Other chronic pain  G89.29 Ambulatory referral to Physical Medicine Rehab    pregabalin (LYRICA) 50 MG capsule   adjusted lyrica, we can also try to increase cymbalta if needed, referral to PM&R    5. Cervical myelopathy (HCC)  G95.9 Ambulatory referral to Physical Medicine Rehab    Baclofen 5 MG TABS    pregabalin (LYRICA) 50 MG capsule   acute on chronic sx, discharged from neurosurgery but having chronic sx with flares    6. Lumbar degenerative disc disease  M51.36 Ambulatory referral to Physical Medicine Rehab    Baclofen 5 MG TABS    pregabalin (LYRICA) 50 MG capsule    7. S/P cervical spinal fusion  Z98.1 Ambulatory referral to Physical Medicine Rehab    Baclofen 5 MG TABS    pregabalin (LYRICA) 50 MG capsule     Printed out pt's pulmonary doctor to f/up with breathing and CPAP  Reviewed and printed out med changes per cardiology - d/c entresto, resume lisinopril, added coreg, continue demedex, f/up here for BP recheck   Meds for chronic MSK pain mostly neck and low back - no longer seeing neurosurgery - avoid NSAIDs due to HTN, tylenol, heat tx, refill on muscle relaxers, lyrica dose adjusted and we can titrate up to help get chronic pain better controlled PM&R will likely be very helpful for sx  management      Danelle Berry, PA-C 02/22/23 12:15 PM

## 2023-02-22 ENCOUNTER — Encounter: Payer: Self-pay | Admitting: Family Medicine

## 2023-02-22 ENCOUNTER — Ambulatory Visit: Payer: Medicaid Other | Admitting: Family Medicine

## 2023-02-22 VITALS — BP 150/82 | HR 99 | Temp 97.4°F | Resp 16 | Ht 67.0 in | Wt 225.0 lb

## 2023-02-22 DIAGNOSIS — T50905A Adverse effect of unspecified drugs, medicaments and biological substances, initial encounter: Secondary | ICD-10-CM

## 2023-02-22 DIAGNOSIS — G959 Disease of spinal cord, unspecified: Secondary | ICD-10-CM | POA: Diagnosis not present

## 2023-02-22 DIAGNOSIS — G8929 Other chronic pain: Secondary | ICD-10-CM

## 2023-02-22 DIAGNOSIS — I10 Essential (primary) hypertension: Secondary | ICD-10-CM | POA: Diagnosis not present

## 2023-02-22 DIAGNOSIS — I5033 Acute on chronic diastolic (congestive) heart failure: Secondary | ICD-10-CM

## 2023-02-22 DIAGNOSIS — M5136 Other intervertebral disc degeneration, lumbar region: Secondary | ICD-10-CM

## 2023-02-22 DIAGNOSIS — Z981 Arthrodesis status: Secondary | ICD-10-CM | POA: Diagnosis not present

## 2023-02-22 DIAGNOSIS — M51369 Other intervertebral disc degeneration, lumbar region without mention of lumbar back pain or lower extremity pain: Secondary | ICD-10-CM

## 2023-02-22 MED ORDER — PREGABALIN 50 MG PO CAPS
50.0000 mg | ORAL_CAPSULE | Freq: Two times a day (BID) | ORAL | 2 refills | Status: DC
Start: 2023-02-22 — End: 2023-09-09

## 2023-02-22 MED ORDER — BACLOFEN 5 MG PO TABS
5.0000 mg | ORAL_TABLET | Freq: Three times a day (TID) | ORAL | 3 refills | Status: DC | PRN
Start: 2023-02-22 — End: 2023-04-22

## 2023-02-22 NOTE — Patient Instructions (Addendum)
Sherryll Burger was stopped - side effect  Restart lisinopril 40 mg daily  New med carvedilol 6.25 twice daily    Make sure you are connected to Pulmonology ([facility] for your breathing and your CPAP machine)  Team Member Contact Info  Raechel Chute, MD Phone: [phone] Fax: [phone]

## 2023-02-25 ENCOUNTER — Encounter: Payer: Self-pay | Admitting: *Deleted

## 2023-02-26 DIAGNOSIS — I10 Essential (primary) hypertension: Secondary | ICD-10-CM | POA: Diagnosis not present

## 2023-02-26 DIAGNOSIS — G4733 Obstructive sleep apnea (adult) (pediatric): Secondary | ICD-10-CM | POA: Diagnosis not present

## 2023-03-01 ENCOUNTER — Emergency Department: Payer: Medicaid Other

## 2023-03-01 ENCOUNTER — Other Ambulatory Visit: Payer: Self-pay

## 2023-03-01 ENCOUNTER — Emergency Department
Admission: EM | Admit: 2023-03-01 | Discharge: 2023-03-01 | Disposition: A | Discharge status: Home / Self Care | Payer: Medicaid Other | Attending: Emergency Medicine | Admitting: Emergency Medicine

## 2023-03-01 DIAGNOSIS — S139XXA Sprain of joints and ligaments of unspecified parts of neck, initial encounter: Secondary | ICD-10-CM | POA: Diagnosis not present

## 2023-03-01 DIAGNOSIS — M47812 Spondylosis without myelopathy or radiculopathy, cervical region: Secondary | ICD-10-CM | POA: Diagnosis not present

## 2023-03-01 DIAGNOSIS — S134XXA Sprain of ligaments of cervical spine, initial encounter: Secondary | ICD-10-CM | POA: Diagnosis not present

## 2023-03-01 DIAGNOSIS — I10 Essential (primary) hypertension: Secondary | ICD-10-CM | POA: Diagnosis not present

## 2023-03-01 DIAGNOSIS — Z043 Encounter for examination and observation following other accident: Secondary | ICD-10-CM | POA: Diagnosis not present

## 2023-03-01 DIAGNOSIS — S199XXA Unspecified injury of neck, initial encounter: Secondary | ICD-10-CM | POA: Diagnosis present

## 2023-03-01 MED ORDER — PROCHLORPERAZINE EDISYLATE 10 MG/2ML IJ SOLN
10.0000 mg | Freq: Once | INTRAMUSCULAR | Status: AC
  Administered 2023-03-01: 10 mg via INTRAVENOUS
  Filled 2023-03-01: qty 2

## 2023-03-01 MED ORDER — LIDOCAINE 5 % EX PTCH: 1.0000 | MEDICATED_PATCH | Freq: Two times a day (BID) | CUTANEOUS | 0 refills | Status: DC

## 2023-03-01 MED ORDER — MORPHINE SULFATE (PF) 4 MG/ML IV SOLN
4.0000 mg | Freq: Once | INTRAVENOUS | Status: AC
  Administered 2023-03-01: 4 mg via INTRAVENOUS
  Filled 2023-03-01: qty 1

## 2023-03-01 NOTE — Discharge Instructions (Signed)
Please seek medical attention for any high fevers, chest pain, shortness of breath, change in behavior, persistent vomiting, bloody stool or any other new or concerning symptoms.  
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
## 2023-03-05 ENCOUNTER — Telehealth: Payer: Self-pay

## 2023-03-05 ENCOUNTER — Other Ambulatory Visit: Payer: Self-pay | Admitting: Family Medicine

## 2023-03-05 DIAGNOSIS — M1A09X0 Idiopathic chronic gout, multiple sites, without tophus (tophi): Secondary | ICD-10-CM

## 2023-03-05 LAB — HM DIABETES EYE EXAM

## 2023-03-05 NOTE — Transitions of Care (Post Inpatient/ED Visit) (Signed)
   03/05/2023  Name: Cory Rivas MRN: [id number] DOB: 16-Mar-1966  Today's TOC FU Call Status: Today's TOC FU Call Status:: Unsuccessul Call (1st Attempt) Unsuccessful Call (1st Attempt) Date: 03/05/23  Attempted to reach the patient regarding the most recent Inpatient/ED visit.  Follow Up Plan: Additional outreach attempts will be made to reach the patient to complete the Transitions of Care (Post Inpatient/ED visit) call.   Abelino Derrick, MHA [facility]  Managed [facility] Social Worker [phone]

## 2023-03-06 ENCOUNTER — Ambulatory Visit: Payer: Medicaid Other | Admitting: Family Medicine

## 2023-03-08 DIAGNOSIS — H5213 Myopia, bilateral: Secondary | ICD-10-CM | POA: Diagnosis not present

## 2023-03-09 ENCOUNTER — Other Ambulatory Visit: Payer: Self-pay

## 2023-03-09 ENCOUNTER — Emergency Department
Admission: EM | Admit: 2023-03-09 | Discharge: 2023-03-09 | Disposition: A | Discharge status: Home / Self Care | Payer: Medicaid Other | Attending: Emergency Medicine | Admitting: Emergency Medicine

## 2023-03-09 ENCOUNTER — Emergency Department: Payer: Medicaid Other

## 2023-03-09 DIAGNOSIS — N189 Chronic kidney disease, unspecified: Secondary | ICD-10-CM | POA: Diagnosis not present

## 2023-03-09 DIAGNOSIS — R0789 Other chest pain: Secondary | ICD-10-CM | POA: Diagnosis not present

## 2023-03-09 DIAGNOSIS — I130 Hypertensive heart and chronic kidney disease with heart failure and stage 1 through stage 4 chronic kidney disease, or unspecified chronic kidney disease: Secondary | ICD-10-CM | POA: Insufficient documentation

## 2023-03-09 DIAGNOSIS — R011 Cardiac murmur, unspecified: Secondary | ICD-10-CM | POA: Diagnosis not present

## 2023-03-09 DIAGNOSIS — R0602 Shortness of breath: Secondary | ICD-10-CM | POA: Diagnosis not present

## 2023-03-09 DIAGNOSIS — I509 Heart failure, unspecified: Secondary | ICD-10-CM | POA: Diagnosis not present

## 2023-03-09 DIAGNOSIS — R079 Chest pain, unspecified: Secondary | ICD-10-CM | POA: Diagnosis not present

## 2023-03-09 DIAGNOSIS — Z87891 Personal history of nicotine dependence: Secondary | ICD-10-CM | POA: Diagnosis not present

## 2023-03-09 DIAGNOSIS — E1122 Type 2 diabetes mellitus with diabetic chronic kidney disease: Secondary | ICD-10-CM | POA: Insufficient documentation

## 2023-03-09 DIAGNOSIS — I700 Atherosclerosis of aorta: Secondary | ICD-10-CM | POA: Diagnosis not present

## 2023-03-09 LAB — BASIC METABOLIC PANEL
Anion gap: 11 (ref 5–15)
BUN: 14 mg/dL (ref 6–20)
CO2: 23 mmol/L (ref 22–32)
Calcium: 8.4 mg/dL — ABNORMAL LOW (ref 8.9–10.3)
Chloride: 101 mmol/L (ref 98–111)
Creatinine, Ser: 1.22 mg/dL (ref 0.61–1.24)
GFR, Estimated: >60 mL/min (ref >60)
Glucose, Bld: 417 mg/dL — ABNORMAL HIGH (ref 70–99)
Potassium: 3.9 mmol/L (ref 3.5–5.1)
Sodium: 135 mmol/L (ref 135–145)

## 2023-03-09 LAB — CBC
HCT: 44.9 % (ref 39.0–52.0)
Hemoglobin: 15.1 g/dL (ref 13.0–17.0)
MCH: 28.6 pg (ref 26.0–34.0)
MCHC: 33.6 g/dL (ref 30.0–36.0)
MCV: 85 fL (ref 80.0–100.0)
Platelets: 186 10*3/uL (ref 150–400)
RBC: 5.28 MIL/uL (ref 4.22–5.81)
RDW: 13.2 % (ref 11.5–15.5)
WBC: 8.4 10*3/uL (ref 4.0–10.5)
nRBC: 0 % (ref 0.0–0.2)

## 2023-03-09 LAB — TROPONIN I (HIGH SENSITIVITY)
Troponin I (High Sensitivity): 17 ng/L (ref <18)
Troponin I (High Sensitivity): 16 ng/L (ref <18)

## 2023-03-09 MED ORDER — IOHEXOL 350 MG/ML SOLN
100.0000 mL | Freq: Once | INTRAVENOUS | Status: AC | PRN
  Administered 2023-03-09: 100 mL via INTRAVENOUS

## 2023-03-09 NOTE — ED Provider Notes (Signed)
[facility] Provider Note    Event Date/Time   First MD Initiated Contact with Patient 03/09/23 [phone]     (approximate)   History   Chest Pain   HPI Cory Rivas is a 57 y.o. male with extensive past medical history that includes but is not limited to hypertension, heart failure, history of respiratory failure, chronic kidney disease, obesity, diabetes, possible COPD (former heavy tobacco smoker).  The patient presents for evaluation of some shortness of breath as well as some chest pain.  He said that he has pain when he takes deep breaths and it radiates up his chest and into his neck at times.  He feels short of breath occasionally but not when he lies flat.  Nothing in particular seems to make it better or worse.  He is not short of breath right now but it seems to come and go.  No recent fever.  He says he has been compliant with his medications.     Physical Exam   Triage Vital Signs: ED Triage Vitals  Enc Vitals Group     BP 03/09/23 0253 (!) 196/95     Pulse Rate 03/09/23 0250 68     Resp 03/09/23 0250 16     Temp 03/09/23 0250 97.9 F (36.6 C)     Temp Source 03/09/23 0250 Oral     SpO2 03/09/23 0250 95 %     Weight 03/09/23 0251 101 kg (222 lb 10.6 oz)     Height 03/09/23 0251 1.702 m (5\' 7" )     Head Circumference --      Peak Flow --      Pain Score 03/09/23 0251 7     Pain Loc --      Pain Edu? --      Excl. in GC? --     Most recent vital signs: Vitals:   03/09/23 0300 03/09/23 0330  BP: (!) 184/89 (!) 164/87  Pulse: 66 60  Resp: (!) 28 (!) 27  Temp:    SpO2: 99% 98%    General: Awake, alert, no obvious distress at this time.  Appears chronically ill but otherwise appears well. CV:  Good peripheral perfusion.  Regular rate and rhythm.  Mild systolic murmur. Resp:  Normal effort. Speaking easily and comfortably, no accessory muscle usage nor intercostal retractions.  Lungs are clear to auscultation.  Patient is mildly  tachypneic but he is not using accessory muscles. Abd:  No distention.  Obese.  No tenderness to palpation throughout the abdomen.   ED Results / Procedures / Treatments   Labs (all labs ordered are listed, but only abnormal results are displayed) Labs Reviewed  BASIC METABOLIC PANEL - Abnormal; Notable for the following components:      Result Value   Glucose, Bld 417 (*)    Calcium 8.4 (*)    All other components within normal limits  CBC  TROPONIN I (HIGH SENSITIVITY)  TROPONIN I (HIGH SENSITIVITY)     EKG  ED ECG REPORT I, Loleta Rose, the attending physician, personally viewed and interpreted this ECG.  Date: 03/09/2023 EKG Time: 2:51 AM Rate: 69 Rhythm: normal sinus rhythm QRS Axis: normal Intervals: normal ST/T Wave abnormalities: normal Narrative Interpretation: no evidence of acute ischemia    RADIOLOGY I viewed and interpreted the patient's two-view chest x-ray.  I see no evidence of acute abnormality such as pneumonia or pneumothorax, nothing to explain the patient's pain.  I also read the radiologist's  report, which confirmed no acute findings.   PROCEDURES:  Critical Care performed: No  Procedures    IMPRESSION / MDM / ASSESSMENT AND PLAN / ED COURSE  I reviewed the triage vital signs and the nursing notes.                              Differential diagnosis includes, but is not limited to, CHF exacerbation, PE, ACS, AAS, pneumonia.  Patient's presentation is most consistent with acute presentation with potential threat to life or bodily function.  Labs/studies ordered: High-sensitivity troponin x 2, basic metabolic panel, CBC, two-view chest x-ray, EKG, CTA chest PE protocol.  Interventions/Medications given:  Medications  iohexol (OMNIPAQUE) 350 MG/ML injection 100 mL (100 mLs Intravenous Contrast Given 03/09/23 0358)    (Note:  hospital course my include additional interventions and/or labs/studies not listed above.)   EKG reassuring  and chest x-ray unremarkable.  Initial high-sensitivity troponin is within normal limits and metabolic panel is normal other than hyperglycemia.  Normal CBC.    Given the patient's comorbidities, I will proceed with CTA chest to rule out the possibility of pulmonary embolism given his extensive chronic medical issues and that he does not seem to be on anticoagulation.  Patient agrees with the plan.  The patient is on the cardiac monitor to evaluate for evidence of arrhythmia and/or significant heart rate changes.   Clinical Course as of 03/09/23 0543  Sat Mar 09, 2023  1610 CT Angio Chest PE W/Cm &/Or Wo Cm I viewed and interpreted the patient's CTA chest.  There is no evidence of pulmonary embolism nor obvious abnormality or infiltrative process within the lungs.  Cardiology confirms some chronic changes but no acute abnormalities. [CF]  [id number] I reassessed the patient and he said he feels better.  He has no distress and his vital signs have been stable.  Even though he has extensive chronic medical issues, I can identify no acute or emergent condition tonight that requires additional evaluation or treatment.  I considered hospitalization given his chronic conditions, but at this point I believe he is low risk for ACS or other emergent condition.  I talked this over with him and he agrees and is comfortable with the plan to go home.  It seems that he has seen Dr. Azucena Cecil with cardiology in the past, but primarily he sees [facility], so I think it would be ideal for him to follow-up in the CHF clinic.  However, given the outpatient clinic and referral process, I put in an order for cardiology referral and they should give him to the right place.  I gave my usual and customary return precautions and he understands and agrees with the plan. [CF]    Clinical Course User Index [CF] Loleta Rose, MD     FINAL CLINICAL IMPRESSION(S) / ED DIAGNOSES   Final diagnoses:  Atypical chest pain      Rx / DC Orders   ED Discharge Orders          Ordered    Ambulatory referral to Cardiology       Comments: If you have not heard from the Cardiology office within the next 72 hours please call [phone].   03/09/23 0541             Note:  This document was prepared using Dragon voice recognition software and may include unintentional dictation errors.   Loleta Rose, MD 03/09/23 [phone]

## 2023-03-09 NOTE — ED Triage Notes (Signed)
Pt to ED from home for CP. Pt is CAOx4, in no acute distress and ambulatory in triage. Placed in wheelchair.

## 2023-03-09 NOTE — Discharge Instructions (Signed)
Your workup in the Emergency Department today was reassuring.  We did not find any specific abnormalities.  We recommend you drink plenty of fluids, take your regular medications and/or any new ones prescribed today, and follow up with the doctor(s) listed in these documents as recommended.  Return to the Emergency Department if you develop new or worsening symptoms that concern you.  

## 2023-03-12 ENCOUNTER — Telehealth: Payer: Self-pay

## 2023-03-12 NOTE — Transitions of Care (Post Inpatient/ED Visit) (Signed)
   03/12/2023  Name: Cory Rivas MRN: [id number] DOB: [birth year]  Today's TOC FU Call Status: Today's TOC FU Call Status:: Unsuccessul Call (1st Attempt) Unsuccessful Call (1st Attempt) Date: 03/12/23  Attempted to reach the patient regarding the most recent Inpatient/ED visit.  Follow Up Plan: Additional outreach attempts will be made to reach the patient to complete the Transitions of Care (Post Inpatient/ED visit) call.   Abelino Derrick, MHA [facility]  Managed [facility] Social Worker [phone]

## 2023-03-20 NOTE — Progress Notes (Signed)
Patient ID: Cory Rivas, male    DOB: 1966-10-01, 57 y.o.   MRN: [id number]  PCP: Danelle Berry, PA-C  Chief Complaint  Patient presents with   Follow-up   Hypertension   Hyperlipidemia   Diabetes    Subjective:   Cory Rivas is a 57 y.o. male, presents to clinic with CC of the following:  HPI   HTN CHF - uncontrolled BP Multiple med changes about a month ago per Cardiology He was due for f/up OV to recheck bp after a few weeks  Coreg increased, resumed lisiniopril, on demedex and amlodipine  BP Readings from Last 3 Encounters:  03/21/23 (!) 158/76  03/09/23 (!) 166/85  03/01/23 (!) 178/88   Chronic pain - lyrica med dose increased, pt was referred to PM&R He also is on muscle relaxers, tylenol, cymbalta, lidocaine patches  Diabetes per endo on trulicity, insulin jardiance metformin - endo at [facility] last A1C 8.3 done April  SOB - per pulm, CHF and OSA - given pulm info many times for pt to call and coordinate his own f/up with his specialist  HLD - last lipids very high, pt had nto been taking statin regularly, is due for recheck  Lab Results  Component Value Date   CHOL 223 (H) 12/06/2022   HDL 43 12/06/2022   LDLCALC 128 (H) 12/06/2022   LDLDIRECT 135 (H) 08/18/2021   TRIG 365 (H) 12/06/2022   CHOLHDL 5.2 (H) 12/06/2022   Lab Results  Component Value Date   LABURIC 7.3 08/18/2021     Wt Readings from Last 5 Encounters:  03/21/23 225 lb 11.2 oz (102.4 kg)  03/09/23 222 lb 10.6 oz (101 kg)  03/01/23 224 lb (101.6 kg)  02/22/23 225 lb (102.1 kg)  02/21/23 224 lb 12.8 oz (102 kg)   BMI Readings from Last 5 Encounters:  03/21/23 35.35 kg/m  03/09/23 34.87 kg/m  03/01/23 35.08 kg/m  02/22/23 35.24 kg/m  02/21/23 35.21 kg/m   Smoking 1 pack a week     Patient Active Problem List   Diagnosis Date Noted   Acute decompensated heart failure (HCC) 11/23/2022   Hypertensive emergency 11/22/2022   Acute respiratory failure with hypoxia (HCC)  11/22/2022   Elevated troponin 11/22/2022   CHF exacerbation (HCC) 08/24/2022   Acute on chronic diastolic congestive heart failure (HCC) 08/23/2022   Class 2 obesity 08/23/2022   Type 2 diabetes mellitus with hyperglycemia (HCC) 08/23/2022   Grade I diastolic dysfunction 06/08/2022   LVH (left ventricular hypertrophy) 06/08/2022   Accelerated hypertension 06/08/2022   S/P cervical spinal fusion 08/18/2021   Former heavy tobacco smoker 08/18/2021   Gout 08/18/2021   Other chronic pain 10/26/2020   Systolic murmur 10/26/2020   Tobacco use 10/26/2020   Polyp of ascending colon    Rectal polyp    Class 2 severe obesity with serious comorbidity and body mass index (BMI) of 39.0 to 39.9 in adult [facility]) 04/15/2019   Chronic kidney disease (CKD) stage G2/A3, mildly decreased glomerular filtration rate (GFR) between 60-89 mL/min/1.73 square meter and albuminuria creatinine ratio greater than 300 mg/g 04/15/2019   Proteinuria 04/15/2019   Hyperlipidemia associated with type 2 diabetes mellitus (HCC) 03/18/2019   Diabetes mellitus (HCC) 03/18/2019   Idiopathic chronic gout of multiple sites without tophus 03/18/2019   Essential hypertension 03/18/2019   Dermatitis 03/18/2019   Lumbar degenerative disc disease 11/22/2015   Cervical myelopathy (HCC) 11/22/2015   Osteoarthritis of spine with radiculopathy, lumbar region 11/22/2015  Current Outpatient Medications:    allopurinol (ZYLOPRIM) 100 MG tablet, TAKE ONE TABLET BY MOUTH ONCE A DAY. TAKE WITH 300 MG TABLETS, Disp: 90 tablet, Rfl: 1   allopurinol (ZYLOPRIM) 300 MG tablet, TAKE ONE TABLET BY MOUTH DAILY WITH 100 MG TABLET. FOR A TOTAL OF 400 MG DAILY FOR GOUT, Disp: 90 tablet, Rfl: 1   amLODipine (NORVASC) 10 MG tablet, TAKE ONE TABLET BY MOUTH DAILY, Disp: 90 tablet, Rfl: 3   Baclofen 5 MG TABS, Take 1-2 tablets (5-10 mg total) by mouth 3 (three) times daily as needed (msk pain or spasms). (Patient taking differently: Take 10 mg by  mouth 3 (three) times daily as needed (msk pain or spasms).), Disp: 90 tablet, Rfl: 3   carvedilol (COREG) 6.25 MG tablet, Take 1 tablet (6.25 mg total) by mouth 2 (two) times daily., Disp: 180 tablet, Rfl: 3   DULoxetine (CYMBALTA) 30 MG capsule, TAKE ONE CAPSULE BY MOUTH TWO TIMES DAILY (Patient taking differently: Take 30 mg by mouth daily as needed.), Disp: 180 capsule, Rfl: 1   ezetimibe (ZETIA) 10 MG tablet, TAKE ONE TABLET BY MOUTH DAILY, Disp: 90 tablet, Rfl: 3   fluticasone (FLONASE) 50 MCG/ACT nasal spray, Place 1 spray into both nostrils daily. (Patient taking differently: Place 1 spray into both nostrils daily as needed for rhinitis or allergies.), Disp: 18.2 mL, Rfl: 6   insulin glargine (LANTUS SOLOSTAR) 100 UNIT/ML Solostar Pen, Inject 20 Units into the skin at bedtime., Disp: , Rfl:    insulin lispro (HUMALOG) 100 UNIT/ML KwikPen, Inject 1-45 Units into the skin 3 (three) times daily. Sliding scale up to 45 units per injection, Disp: , Rfl:    ipratropium-albuterol (DUONEB) 0.5-2.5 (3) MG/3ML SOLN, Take 3 mLs by nebulization every 6 (six) hours as needed (SOB wheeze cough)., Disp: 180 mL, Rfl: 1   JARDIANCE 10 MG TABS tablet, TAKE ONE TABLET BY MOUTH DAILY BEFORE BREAKFAST, Disp: 30 tablet, Rfl: 3   levocetirizine (XYZAL) 5 MG tablet, TAKE ONE TABLET BY MOUTH EVERY EVENING (Patient taking differently: Take 5 mg by mouth daily as needed for allergies.), Disp: 90 tablet, Rfl: 1   lidocaine (LIDODERM) 5 %, Place 1 patch onto the skin every 12 (twelve) hours. Remove & Discard patch within 12 hours or as directed by MD, Disp: 10 patch, Rfl: 0   lisinopril (ZESTRIL) 40 MG tablet, Take 1 tablet (40 mg total) by mouth daily., Disp: 90 tablet, Rfl: 3   metFORMIN (GLUCOPHAGE) 1000 MG tablet, TAKE ONE TABLET BY MOUTH TWO TIMES A DAY6 WITHA MEAL (Patient taking differently: Take 1,000 mg by mouth at bedtime.), Disp: 180 tablet, Rfl: 3   nicotine polacrilex (GNP NICOTINE POLACRILEX) 2 MG lozenge,  TAKE ONE LOZENGE (2MG  TOTAL) BY MOUTH EVERY TWO HOURS AS NEEDED FOR SMOKING CESSATION (Patient taking differently: Take 2 mg by mouth as needed for smoking cessation.), Disp: 27 lozenge, Rfl: 6   ondansetron (ZOFRAN-ODT) 4 MG disintegrating tablet, Take 1-2 tablets (4-8 mg total) by mouth every 8 (eight) hours as needed for nausea or vomiting., Disp: 20 tablet, Rfl: 0   potassium chloride SA (KLOR-CON M) 20 MEQ tablet, Take 1 tablet (20 mEq total) by mouth 2 (two) times daily. Take with furosemide, Disp: 60 tablet, Rfl: 5   pregabalin (LYRICA) 50 MG capsule, Take 1 capsule (50 mg total) by mouth 2 (two) times daily., Disp: 60 capsule, Rfl: 2   rosuvastatin (CRESTOR) 40 MG tablet, TAKE ONE TABLET BY MOUTH DAILY, Disp: 90 tablet, Rfl: 3  Tiotropium Bromide Monohydrate (SPIRIVA RESPIMAT) 2.5 MCG/ACT AERS, Inhale 2 puffs into the lungs daily., Disp: 30 each, Rfl: 11   torsemide (DEMADEX) 20 MG tablet, Take 2 tablets (40 mg total) by mouth daily. (Patient taking differently: Take 20 mg by mouth daily.), Disp: 60 tablet, Rfl: 5   TRULICITY 3 MG/0.5ML SOPN, Inject 3 mg into the skin daily., Disp: , Rfl:    VENTOLIN HFA 108 (90 Base) MCG/ACT inhaler, INHALE 2 PUFFS INTO THE LUNGS EVERY 6 HOURS AS NEEDED FOR WHEEZING OR SHORTNESS OF BREATH, Disp: 18 g, Rfl: 2 No current facility-administered medications for this visit.  Facility-Administered Medications Ordered in Other Visits:    albuterol (PROVENTIL) (2.5 MG/3ML) 0.083% nebulizer solution 2.5 mg, 2.5 mg, Nebulization, Once, Raechel Chute, MD   Allergies  Allergen Reactions   Oxycodone Nausea And Vomiting   Entresto [Sacubitril-Valsartan] Nausea Only and Other (See Comments)    Lightheaded and dizzy    Onion Nausea And Vomiting     Social History   Tobacco Use   Smoking status: Some Days    Packs/day: 1.00    Years: 37.00    Additional pack years: 0.00    Total pack years: 37.00    Types: Cigarettes    Start date: 10/19/1989    Last attempt  to quit: 08/20/2022    Years since quitting: 0.5   Smokeless tobacco: Never   Tobacco comments:    30+ years hx smoking 2 ppd, stopped smoking briefly in April 2022 for a surgery and then restarted smoking about 1/2 ppd last 4-5 months     Quit smoking 08/20/2022  Vaping Use   Vaping Use: Never used  Substance Use Topics   Alcohol use: No   Drug use: No      Chart Review Today: I personally reviewed active problem list, medication list, allergies, family history, social history, health maintenance, notes from last encounter, lab results, imaging with the patient/caregiver today.   Review of Systems  Constitutional: Negative.   HENT: Negative.    Eyes: Negative.   Respiratory: Negative.    Cardiovascular: Negative.   Gastrointestinal: Negative.   Endocrine: Negative.   Genitourinary: Negative.   Musculoskeletal: Negative.   Skin: Negative.   Allergic/Immunologic: Negative.   Neurological: Negative.   Hematological: Negative.   Psychiatric/Behavioral: Negative.    All other systems reviewed and are negative.      Objective:   Vitals:   03/21/23 1116  BP: (!) 158/76  Pulse: 87  Resp: 16  Temp: 98 F (36.7 C)  TempSrc: Oral  SpO2: 97%  Weight: 225 lb 11.2 oz (102.4 kg)  Height: 5\' 7"  (1.702 m)    Body mass index is 35.35 kg/m.  Physical Exam Vitals and nursing note reviewed.  Constitutional:      General: He is not in acute distress.    Appearance: Normal appearance. He is normal weight. He is not ill-appearing, toxic-appearing or diaphoretic.  HENT:     Head: Normocephalic and atraumatic.     Right Ear: External ear normal.     Left Ear: External ear normal.  Eyes:     General: No scleral icterus.       Right eye: No discharge.        Left eye: No discharge.     Conjunctiva/sclera: Conjunctivae normal.  Cardiovascular:     Rate and Rhythm: Normal rate and regular rhythm.     Pulses: Normal pulses.     Heart sounds: Normal heart sounds.  Pulmonary:      Effort: Pulmonary effort is normal. No respiratory distress.     Breath sounds: Normal breath sounds. No stridor. No wheezing or rales.  Abdominal:     General: Bowel sounds are normal.     Palpations: Abdomen is soft.  Musculoskeletal:     Cervical back: Rigidity present.  Neurological:     Mental Status: Mental status is at baseline.     Gait: Gait normal.  Psychiatric:        Mood and Affect: Mood normal.      Results for orders placed or performed during the hospital encounter of 03/09/23  Basic metabolic panel  Result Value Ref Range   Sodium 135 135 - 145 mmol/L   Potassium 3.9 3.5 - 5.1 mmol/L   Chloride 101 98 - 111 mmol/L   CO2 23 22 - 32 mmol/L   Glucose, Bld 417 (H) 70 - 99 mg/dL   BUN 14 6 - 20 mg/dL   Creatinine, Ser 1.61 0.61 - 1.24 mg/dL   Calcium 8.4 (L) 8.9 - 10.3 mg/dL   GFR, Estimated >09 >60 mL/min   Anion gap 11 5 - 15  CBC  Result Value Ref Range   WBC 8.4 4.0 - 10.5 K/uL   RBC 5.28 4.22 - 5.81 MIL/uL   Hemoglobin 15.1 13.0 - 17.0 g/dL   HCT 45.4 09.8 - 11.9 %   MCV 85.0 80.0 - 100.0 fL   MCH 28.6 26.0 - 34.0 pg   MCHC 33.6 30.0 - 36.0 g/dL   RDW 14.7 82.9 - 56.2 %   Platelets 186 150 - 400 K/uL   nRBC 0.0 0.0 - 0.2 %  Troponin I (High Sensitivity)  Result Value Ref Range   Troponin I (High Sensitivity) 17 <18 ng/L  Troponin I (High Sensitivity)  Result Value Ref Range   Troponin I (High Sensitivity) 16 <18 ng/L       Assessment & Plan:   Pt here for routine f/up and labs Uncontrolled HTN with meds recently changed by cardiology - pt unclear about his current meds and doses, he doesn't have them with him Will obtain needed labs, have him check meds/doses at home and f/up again for BP recheck with all meds in hand.    ICD-10-CM   1. Essential hypertension  I10 COMPLETE METABOLIC PANEL WITH GFR   uncontrolled    2. Idiopathic chronic gout of multiple sites without tophus  M1A.09X0 COMPLETE METABOLIC PANEL WITH GFR    Uric acid   check  uric acid, on allopurinol, no recent flares    3. Cervical myelopathy (HCC)  G95.9    worse neck pain and radiculopathy    4. Acute on chronic diastolic congestive heart failure (HCC)  I50.33     5. Chronic kidney disease (CKD) stage G2/A3, mildly decreased glomerular filtration rate (GFR) between 60-89 mL/min/1.73 square meter and albuminuria creatinine ratio greater than 300 mg/g  N18.2 COMPLETE METABOLIC PANEL WITH GFR    6. Lumbar degenerative disc disease  M51.36     7. Hyperlipidemia associated with type 2 diabetes mellitus (HCC)  E11.69 COMPLETE METABOLIC PANEL WITH GFR   E78.5 Lipid Panel w/reflex Direct LDL   last labs very high, pt was encouraged to take statin more regularly, recheck labs today    8. Uncontrolled diabetes mellitus with hyperglycemia, with long-term current use of insulin (HCC)  E11.65 COMPLETE METABOLIC PANEL WITH GFR   Z79.4 Hemoglobin A1c    Microalbumin / creatinine urine  ratio   per endocrinology - has been uncontrolled    9. Other chronic pain  G89.29     10. Encounter for medication monitoring  Z51.81 CBC with Differential/Platelet    COMPLETE METABOLIC PANEL WITH GFR    Hemoglobin A1c    Uric acid    Microalbumin / creatinine urine ratio    11. Asthma-COPD overlap syndrome  J44.89    per pulmonology    12. Encounter for examination following treatment at hospital  Z09 CBC with Differential/Platelet    COMPLETE METABOLIC PANEL WITH GFR             Danelle Berry, PA-C 03/21/23 11:43 AM

## 2023-03-21 ENCOUNTER — Emergency Department: Payer: Medicaid Other

## 2023-03-21 ENCOUNTER — Ambulatory Visit: Payer: Medicaid Other | Admitting: Family Medicine

## 2023-03-21 ENCOUNTER — Encounter: Payer: Self-pay | Admitting: *Deleted

## 2023-03-21 ENCOUNTER — Encounter: Payer: Self-pay | Admitting: Family Medicine

## 2023-03-21 ENCOUNTER — Other Ambulatory Visit: Payer: Self-pay

## 2023-03-21 VITALS — BP 138/72 | HR 87 | Temp 98.0°F | Resp 16 | Ht 67.0 in | Wt 225.7 lb

## 2023-03-21 DIAGNOSIS — E1169 Type 2 diabetes mellitus with other specified complication: Secondary | ICD-10-CM | POA: Diagnosis not present

## 2023-03-21 DIAGNOSIS — M1A09X0 Idiopathic chronic gout, multiple sites, without tophus (tophi): Secondary | ICD-10-CM

## 2023-03-21 DIAGNOSIS — Z79899 Other long term (current) drug therapy: Secondary | ICD-10-CM | POA: Insufficient documentation

## 2023-03-21 DIAGNOSIS — Z794 Long term (current) use of insulin: Secondary | ICD-10-CM | POA: Insufficient documentation

## 2023-03-21 DIAGNOSIS — R1013 Epigastric pain: Secondary | ICD-10-CM | POA: Insufficient documentation

## 2023-03-21 DIAGNOSIS — I10 Essential (primary) hypertension: Secondary | ICD-10-CM

## 2023-03-21 DIAGNOSIS — R079 Chest pain, unspecified: Secondary | ICD-10-CM | POA: Diagnosis not present

## 2023-03-21 DIAGNOSIS — R112 Nausea with vomiting, unspecified: Secondary | ICD-10-CM | POA: Diagnosis not present

## 2023-03-21 DIAGNOSIS — R0789 Other chest pain: Secondary | ICD-10-CM | POA: Diagnosis not present

## 2023-03-21 DIAGNOSIS — J4489 Other specified chronic obstructive pulmonary disease: Secondary | ICD-10-CM | POA: Diagnosis not present

## 2023-03-21 DIAGNOSIS — G8929 Other chronic pain: Secondary | ICD-10-CM

## 2023-03-21 DIAGNOSIS — I5033 Acute on chronic diastolic (congestive) heart failure: Secondary | ICD-10-CM

## 2023-03-21 DIAGNOSIS — Z7984 Long term (current) use of oral hypoglycemic drugs: Secondary | ICD-10-CM | POA: Insufficient documentation

## 2023-03-21 DIAGNOSIS — I130 Hypertensive heart and chronic kidney disease with heart failure and stage 1 through stage 4 chronic kidney disease, or unspecified chronic kidney disease: Secondary | ICD-10-CM | POA: Diagnosis not present

## 2023-03-21 DIAGNOSIS — E1122 Type 2 diabetes mellitus with diabetic chronic kidney disease: Secondary | ICD-10-CM | POA: Insufficient documentation

## 2023-03-21 DIAGNOSIS — N182 Chronic kidney disease, stage 2 (mild): Secondary | ICD-10-CM | POA: Diagnosis not present

## 2023-03-21 DIAGNOSIS — R0602 Shortness of breath: Secondary | ICD-10-CM | POA: Diagnosis not present

## 2023-03-21 DIAGNOSIS — E1165 Type 2 diabetes mellitus with hyperglycemia: Secondary | ICD-10-CM

## 2023-03-21 DIAGNOSIS — F17200 Nicotine dependence, unspecified, uncomplicated: Secondary | ICD-10-CM | POA: Diagnosis not present

## 2023-03-21 DIAGNOSIS — I509 Heart failure, unspecified: Secondary | ICD-10-CM | POA: Insufficient documentation

## 2023-03-21 DIAGNOSIS — G959 Disease of spinal cord, unspecified: Secondary | ICD-10-CM | POA: Diagnosis not present

## 2023-03-21 DIAGNOSIS — Z09 Encounter for follow-up examination after completed treatment for conditions other than malignant neoplasm: Secondary | ICD-10-CM | POA: Diagnosis not present

## 2023-03-21 DIAGNOSIS — Z5181 Encounter for therapeutic drug level monitoring: Secondary | ICD-10-CM | POA: Diagnosis not present

## 2023-03-21 DIAGNOSIS — E785 Hyperlipidemia, unspecified: Secondary | ICD-10-CM | POA: Diagnosis not present

## 2023-03-21 DIAGNOSIS — M5136 Other intervertebral disc degeneration, lumbar region: Secondary | ICD-10-CM

## 2023-03-21 DIAGNOSIS — I5041 Acute combined systolic (congestive) and diastolic (congestive) heart failure: Secondary | ICD-10-CM | POA: Diagnosis not present

## 2023-03-21 LAB — BASIC METABOLIC PANEL
Anion gap: 16 — ABNORMAL HIGH (ref 5–15)
BUN: 18 mg/dL (ref 6–20)
CO2: 21 mmol/L — ABNORMAL LOW (ref 22–32)
Calcium: 8.9 mg/dL (ref 8.9–10.3)
Chloride: 97 mmol/L — ABNORMAL LOW (ref 98–111)
Creatinine, Ser: 1.45 mg/dL — ABNORMAL HIGH (ref 0.61–1.24)
GFR, Estimated: 57 mL/min — ABNORMAL LOW (ref >60)
Glucose, Bld: 449 mg/dL — ABNORMAL HIGH (ref 70–99)
Potassium: 3.5 mmol/L (ref 3.5–5.1)
Sodium: 134 mmol/L — ABNORMAL LOW (ref 135–145)

## 2023-03-21 LAB — CBC
HCT: 48.8 % (ref 39.0–52.0)
Hemoglobin: 17.1 g/dL — ABNORMAL HIGH (ref 13.0–17.0)
MCH: 29.1 pg (ref 26.0–34.0)
MCHC: 35 g/dL (ref 30.0–36.0)
MCV: 83.1 fL (ref 80.0–100.0)
Platelets: 245 10*3/uL (ref 150–400)
RBC: 5.87 MIL/uL — ABNORMAL HIGH (ref 4.22–5.81)
RDW: 13.1 % (ref 11.5–15.5)
WBC: 11.3 10*3/uL — ABNORMAL HIGH (ref 4.0–10.5)
nRBC: 0 % (ref 0.0–0.2)

## 2023-03-21 LAB — TROPONIN I (HIGH SENSITIVITY): Troponin I (High Sensitivity): 17 ng/L (ref <18)

## 2023-03-21 LAB — CBC WITH DIFFERENTIAL/PLATELET
Absolute Monocytes: 510 cells/uL (ref 200–950)
Basophils Absolute: 36 cells/uL (ref 0–200)
MCH: 28.8 pg (ref 27.0–33.0)
MCHC: 32.8 g/dL (ref 32.0–36.0)
RBC: 5.9 10*6/uL — ABNORMAL HIGH (ref 4.20–5.80)
RDW: 13.9 % (ref 11.0–15.0)

## 2023-03-21 LAB — BRAIN NATRIURETIC PEPTIDE: B Natriuretic Peptide: 53.5 pg/mL (ref 0.0–100.0)

## 2023-03-21 NOTE — Patient Instructions (Signed)
You need to follow up with cardiology after your last ER visit   Instructions from the ER on 5/25: If you have not heard from the Cardiology office within the next 72 hours please call [phone]

## 2023-03-21 NOTE — ED Triage Notes (Signed)
Pt states chest pain since 1700 today.  Pt reports pain in center of chest.  Pt also has sob.  Cig smoker.  Hx chf.  Pt alert.  Speech clear.

## 2023-03-22 ENCOUNTER — Telehealth: Payer: Self-pay

## 2023-03-22 ENCOUNTER — Emergency Department
Admission: EM | Admit: 2023-03-22 | Discharge: 2023-03-22 | Disposition: A | Discharge status: Home / Self Care | Payer: Medicaid Other | Attending: Emergency Medicine | Admitting: Emergency Medicine

## 2023-03-22 ENCOUNTER — Other Ambulatory Visit: Payer: Self-pay

## 2023-03-22 DIAGNOSIS — Z87891 Personal history of nicotine dependence: Secondary | ICD-10-CM | POA: Insufficient documentation

## 2023-03-22 DIAGNOSIS — Z794 Long term (current) use of insulin: Secondary | ICD-10-CM | POA: Insufficient documentation

## 2023-03-22 DIAGNOSIS — Z7984 Long term (current) use of oral hypoglycemic drugs: Secondary | ICD-10-CM | POA: Insufficient documentation

## 2023-03-22 DIAGNOSIS — Z79899 Other long term (current) drug therapy: Secondary | ICD-10-CM | POA: Insufficient documentation

## 2023-03-22 DIAGNOSIS — E1122 Type 2 diabetes mellitus with diabetic chronic kidney disease: Secondary | ICD-10-CM | POA: Insufficient documentation

## 2023-03-22 DIAGNOSIS — N182 Chronic kidney disease, stage 2 (mild): Secondary | ICD-10-CM | POA: Insufficient documentation

## 2023-03-22 DIAGNOSIS — R079 Chest pain, unspecified: Secondary | ICD-10-CM

## 2023-03-22 DIAGNOSIS — I130 Hypertensive heart and chronic kidney disease with heart failure and stage 1 through stage 4 chronic kidney disease, or unspecified chronic kidney disease: Secondary | ICD-10-CM | POA: Insufficient documentation

## 2023-03-22 DIAGNOSIS — R112 Nausea with vomiting, unspecified: Secondary | ICD-10-CM | POA: Insufficient documentation

## 2023-03-22 DIAGNOSIS — E1165 Type 2 diabetes mellitus with hyperglycemia: Secondary | ICD-10-CM | POA: Insufficient documentation

## 2023-03-22 DIAGNOSIS — I5041 Acute combined systolic (congestive) and diastolic (congestive) heart failure: Secondary | ICD-10-CM | POA: Insufficient documentation

## 2023-03-22 DIAGNOSIS — R739 Hyperglycemia, unspecified: Secondary | ICD-10-CM

## 2023-03-22 LAB — CBC WITH DIFFERENTIAL/PLATELET
Basophils Relative: 0.4 %
Eosinophils Absolute: 191 cells/uL (ref 15–500)
Eosinophils Relative: 2.1 %
HCT: 51.8 % — ABNORMAL HIGH (ref 38.5–50.0)
Hemoglobin: 17 g/dL (ref 13.2–17.1)
Lymphs Abs: 1866 cells/uL (ref 850–3900)
MCV: 87.8 fL (ref 80.0–100.0)
MPV: 10.3 fL (ref 7.5–12.5)
Monocytes Relative: 5.6 %
Neutro Abs: 6497 cells/uL (ref 1500–7800)
Neutrophils Relative %: 71.4 %
Platelets: 232 10*3/uL (ref 140–400)
Total Lymphocyte: 20.5 %
WBC: 9.1 10*3/uL (ref 3.8–10.8)

## 2023-03-22 LAB — COMPLETE METABOLIC PANEL WITH GFR
AG Ratio: 1.3 (calc) (ref 1.0–2.5)
ALT: 18 U/L (ref 9–46)
AST: 14 U/L (ref 10–35)
Albumin: 4.3 g/dL (ref 3.6–5.1)
Alkaline phosphatase (APISO): 95 U/L (ref 35–144)
BUN/Creatinine Ratio: 11 (calc) (ref 6–22)
BUN: 15 mg/dL (ref 7–25)
CO2: 25 mmol/L (ref 20–32)
Calcium: 9.2 mg/dL (ref 8.6–10.3)
Chloride: 98 mmol/L (ref 98–110)
Creat: 1.31 mg/dL — ABNORMAL HIGH (ref 0.70–1.30)
Globulin: 3.2 g/dL (calc) (ref 1.9–3.7)
Glucose, Bld: 466 mg/dL — ABNORMAL HIGH (ref 65–99)
Potassium: 4.3 mmol/L (ref 3.5–5.3)
Sodium: 137 mmol/L (ref 135–146)
Total Bilirubin: 0.5 mg/dL (ref 0.2–1.2)
Total Protein: 7.5 g/dL (ref 6.1–8.1)
eGFR: 64 mL/min/{1.73_m2} (ref >=60)

## 2023-03-22 LAB — MICROALBUMIN / CREATININE URINE RATIO
Creatinine, Urine: 8 mg/dL — ABNORMAL LOW (ref 20–320)
Microalb Creat Ratio: 1338 mg/g creat — ABNORMAL HIGH (ref <30)
Microalb, Ur: 10.7 mg/dL

## 2023-03-22 LAB — BASIC METABOLIC PANEL
Anion gap: 13 (ref 5–15)
BUN: 18 mg/dL (ref 6–20)
CO2: 25 mmol/L (ref 22–32)
Calcium: 9.1 mg/dL (ref 8.9–10.3)
Chloride: 100 mmol/L (ref 98–111)
Creatinine, Ser: 1.51 mg/dL — ABNORMAL HIGH (ref 0.61–1.24)
GFR, Estimated: 54 mL/min — ABNORMAL LOW (ref >60)
Glucose, Bld: 171 mg/dL — ABNORMAL HIGH (ref 70–99)
Potassium: 3.5 mmol/L (ref 3.5–5.1)
Sodium: 138 mmol/L (ref 135–145)

## 2023-03-22 LAB — LIPID PANEL W/REFLEX DIRECT LDL
Cholesterol: 229 mg/dL — ABNORMAL HIGH (ref <200)
HDL: 43 mg/dL (ref >=40)
Non-HDL Cholesterol (Calc): 186 mg/dL (calc) — ABNORMAL HIGH (ref <130)
Total CHOL/HDL Ratio: 5.3 (calc) — ABNORMAL HIGH (ref <5.0)
Triglycerides: 536 mg/dL — ABNORMAL HIGH (ref <150)

## 2023-03-22 LAB — HEMOGLOBIN A1C
Hgb A1c MFr Bld: 10.2 % of total Hgb — ABNORMAL HIGH (ref <5.7)
Mean Plasma Glucose: 246 mg/dL
eAG (mmol/L): 13.6 mmol/L

## 2023-03-22 LAB — HEPATIC FUNCTION PANEL
ALT: 20 U/L (ref 0–44)
AST: 21 U/L (ref 15–41)
Albumin: 4.4 g/dL (ref 3.5–5.0)
Alkaline Phosphatase: 92 U/L (ref 38–126)
Bilirubin, Direct: <0.1 mg/dL (ref 0.0–0.2)
Total Bilirubin: 0.9 mg/dL (ref 0.3–1.2)
Total Protein: 7.8 g/dL (ref 6.5–8.1)

## 2023-03-22 LAB — CBC
HCT: 49.9 % (ref 39.0–52.0)
Hemoglobin: 17.4 g/dL — ABNORMAL HIGH (ref 13.0–17.0)
MCH: 29.2 pg (ref 26.0–34.0)
MCHC: 34.9 g/dL (ref 30.0–36.0)
MCV: 83.9 fL (ref 80.0–100.0)
Platelets: 247 10*3/uL (ref 150–400)
RBC: 5.95 MIL/uL — ABNORMAL HIGH (ref 4.22–5.81)
RDW: 13.4 % (ref 11.5–15.5)
WBC: 12 10*3/uL — ABNORMAL HIGH (ref 4.0–10.5)
nRBC: 0 % (ref 0.0–0.2)

## 2023-03-22 LAB — LIPASE, BLOOD: Lipase: 74 U/L — ABNORMAL HIGH (ref 11–51)

## 2023-03-22 LAB — CBG MONITORING, ED
Glucose-Capillary: 195 mg/dL — ABNORMAL HIGH (ref 70–99)
Glucose-Capillary: 320 mg/dL — ABNORMAL HIGH (ref 70–99)
Glucose-Capillary: 329 mg/dL — ABNORMAL HIGH (ref 70–99)

## 2023-03-22 LAB — TROPONIN I (HIGH SENSITIVITY): Troponin I (High Sensitivity): 20 ng/L — ABNORMAL HIGH (ref <18)

## 2023-03-22 LAB — URIC ACID: Uric Acid, Serum: 6.7 mg/dL (ref 4.0–8.0)

## 2023-03-22 LAB — D-DIMER, QUANTITATIVE: D-Dimer, Quant: <0.27 ug/mL-FEU (ref 0.00–0.50)

## 2023-03-22 LAB — DIRECT LDL: Direct LDL: 130 mg/dL — ABNORMAL HIGH (ref <100)

## 2023-03-22 MED ORDER — FAMOTIDINE IN NACL 20-0.9 MG/50ML-% IV SOLN
20.0000 mg | Freq: Once | INTRAVENOUS | Status: AC
  Administered 2023-03-22: 20 mg via INTRAVENOUS
  Filled 2023-03-22: qty 50

## 2023-03-22 MED ORDER — INSULIN ASPART 100 UNIT/ML IJ SOLN: 12.0000 [IU] | Freq: Once | INTRAMUSCULAR | Status: DC

## 2023-03-22 MED ORDER — INSULIN ASPART 100 UNIT/ML IJ SOLN
10.0000 [IU] | Freq: Once | INTRAMUSCULAR | Status: AC
  Administered 2023-03-22: 10 [IU] via SUBCUTANEOUS
  Filled 2023-03-22: qty 1

## 2023-03-22 MED ORDER — SODIUM CHLORIDE 0.9 % IV BOLUS
500.0000 mL | Freq: Once | INTRAVENOUS | Status: AC
  Administered 2023-03-22: 500 mL via INTRAVENOUS

## 2023-03-22 NOTE — ED Provider Notes (Signed)
[facility] Provider Note    Event Date/Time   First MD Initiated Contact with Patient 03/22/23 [phone]     (approximate)   History   Chest Pain   HPI  Cory Rivas is a 57 y.o. male who presents to the ED from home with a chief complaint of chest pain since 5 PM.  Patient reports sharp substernal/epigastric pain worsened on inspiration.  History of CHF.  Patient is a smoker.  Symptoms associated with shortness of breath.  Denies associated diaphoresis, palpitations, nausea/vomiting or dizziness.  Denies recent fever/chills, cough, abdominal pain.  Chest pain resolved by the time of our interview and examination.  Denies recent trauma, travel or hormone use.     Past Medical History   Past Medical History:  Diagnosis Date   Bulging of cervical intervertebral disc    CHF (congestive heart failure) (HCC)    Diabetes mellitus without complication (HCC)    Gout    High cholesterol    Hypertension    Stress due to family tension 04/15/2019     Active Problem List   Patient Active Problem List   Diagnosis Date Noted   Acute decompensated heart failure (HCC) 11/23/2022   Hypertensive emergency 11/22/2022   Acute respiratory failure with hypoxia (HCC) 11/22/2022   Elevated troponin 11/22/2022   CHF exacerbation (HCC) 08/24/2022   Acute on chronic diastolic congestive heart failure (HCC) 08/23/2022   Class 2 obesity 08/23/2022   Type 2 diabetes mellitus with hyperglycemia (HCC) 08/23/2022   Grade I diastolic dysfunction 06/08/2022   LVH (left ventricular hypertrophy) 06/08/2022   Accelerated hypertension 06/08/2022   S/P cervical spinal fusion 08/18/2021   Former heavy tobacco smoker 08/18/2021   Gout 08/18/2021   Other chronic pain 10/26/2020   Systolic murmur 10/26/2020   Tobacco use 10/26/2020   Polyp of ascending colon    Rectal polyp    Class 2 severe obesity with serious comorbidity and body mass index (BMI) of 39.0 to 39.9 in adult [facility])  04/15/2019   Chronic kidney disease (CKD) stage G2/A3, mildly decreased glomerular filtration rate (GFR) between 60-89 mL/min/1.73 square meter and albuminuria creatinine ratio greater than 300 mg/g 04/15/2019   Proteinuria 04/15/2019   Hyperlipidemia associated with type 2 diabetes mellitus (HCC) 03/18/2019   Diabetes mellitus (HCC) 03/18/2019   Idiopathic chronic gout of multiple sites without tophus 03/18/2019   Essential hypertension 03/18/2019   Dermatitis 03/18/2019   Lumbar degenerative disc disease 11/22/2015   Cervical myelopathy (HCC) 11/22/2015   Osteoarthritis of spine with radiculopathy, lumbar region 11/22/2015     Past Surgical History   Past Surgical History:  Procedure Laterality Date   CHOLECYSTECTOMY     COLONOSCOPY WITH PROPOFOL N/A 03/10/2020   Procedure: COLONOSCOPY WITH PROPOFOL;  Surgeon: Midge Minium, MD;  Location: [facility] ENDOSCOPY;  Service: Endoscopy;  Laterality: N/A;     Home Medications   Prior to Admission medications   Medication Sig Start Date End Date Taking? Authorizing Provider  allopurinol (ZYLOPRIM) 100 MG tablet TAKE ONE TABLET BY MOUTH ONCE A DAY. TAKE WITH 300 MG TABLETS 01/23/23   Danelle Berry, PA-C  allopurinol (ZYLOPRIM) 300 MG tablet TAKE ONE TABLET BY MOUTH DAILY WITH 100 MG TABLET. FOR A TOTAL OF 400 MG DAILY FOR GOUT 03/05/23   Danelle Berry, PA-C  amLODipine (NORVASC) 10 MG tablet TAKE ONE TABLET BY MOUTH DAILY 01/11/23   Danelle Berry, PA-C  Baclofen 5 MG TABS Take 1-2 tablets (5-10 mg total) by mouth  3 (three) times daily as needed (msk pain or spasms). Patient taking differently: Take 10 mg by mouth 3 (three) times daily as needed (msk pain or spasms). 02/22/23   Danelle Berry, PA-C  carvedilol (COREG) 6.25 MG tablet Take 1 tablet (6.25 mg total) by mouth 2 (two) times daily. 02/21/23   Debbe Odea, MD  DULoxetine (CYMBALTA) 30 MG capsule TAKE ONE CAPSULE BY MOUTH TWO TIMES DAILY Patient taking differently: Take 30 mg by mouth daily  as needed. 01/11/23   Danelle Berry, PA-C  ezetimibe (ZETIA) 10 MG tablet TAKE ONE TABLET BY MOUTH DAILY 01/11/23   Danelle Berry, PA-C  fluticasone (FLONASE) 50 MCG/ACT nasal spray Place 1 spray into both nostrils daily. Patient taking differently: Place 1 spray into both nostrils daily as needed for rhinitis or allergies. 01/23/23 01/23/24  Danelle Berry, PA-C  insulin glargine (LANTUS SOLOSTAR) 100 UNIT/ML Solostar Pen Inject 20 Units into the skin at bedtime. 11/01/20   [provider]  insulin lispro (HUMALOG) 100 UNIT/ML KwikPen Inject 1-45 Units into the skin 3 (three) times daily. Sliding scale up to 45 units per injection 11/08/20   [provider]  ipratropium-albuterol (DUONEB) 0.5-2.5 (3) MG/3ML SOLN Take 3 mLs by nebulization every 6 (six) hours as needed (SOB wheeze cough). 07/23/22   Danelle Berry, PA-C  JARDIANCE 10 MG TABS tablet TAKE ONE TABLET BY MOUTH DAILY BEFORE BREAKFAST 01/11/23   Clarisa Kindred A, FNP  levocetirizine (XYZAL) 5 MG tablet TAKE ONE TABLET BY MOUTH EVERY EVENING Patient taking differently: Take 5 mg by mouth daily as needed for allergies. 01/11/23   Danelle Berry, PA-C  lidocaine (LIDODERM) 5 % Place 1 patch onto the skin every 12 (twelve) hours. Remove & Discard patch within 12 hours or as directed by MD 03/01/23 02/29/24  Phineas Semen, MD  lisinopril (ZESTRIL) 40 MG tablet Take 1 tablet (40 mg total) by mouth daily. 02/21/23 05/22/23  Debbe Odea, MD  metFORMIN (GLUCOPHAGE) 1000 MG tablet TAKE ONE TABLET BY MOUTH TWO TIMES A DAY6 WITHA MEAL Patient taking differently: Take 1,000 mg by mouth at bedtime. 01/11/23   Danelle Berry, PA-C  nicotine polacrilex (GNP NICOTINE POLACRILEX) 2 MG lozenge TAKE ONE LOZENGE (2MG  TOTAL) BY MOUTH EVERY TWO HOURS AS NEEDED FOR SMOKING CESSATION Patient taking differently: Take 2 mg by mouth as needed for smoking cessation. 01/17/23   Raechel Chute, MD  ondansetron (ZOFRAN-ODT) 4 MG disintegrating tablet Take 1-2 tablets (4-8  mg total) by mouth every 8 (eight) hours as needed for nausea or vomiting. 02/18/23   Danelle Berry, PA-C  potassium chloride SA (KLOR-CON M) 20 MEQ tablet Take 1 tablet (20 mEq total) by mouth 2 (two) times daily. Take with furosemide 01/10/23   Clarisa Kindred A, FNP  pregabalin (LYRICA) 50 MG capsule Take 1 capsule (50 mg total) by mouth 2 (two) times daily. 02/22/23   Danelle Berry, PA-C  rosuvastatin (CRESTOR) 40 MG tablet TAKE ONE TABLET BY MOUTH DAILY 01/11/23   Danelle Berry, PA-C  Tiotropium Bromide Monohydrate (SPIRIVA RESPIMAT) 2.5 MCG/ACT AERS Inhale 2 puffs into the lungs daily. 09/24/22   Raechel Chute, MD  torsemide (DEMADEX) 20 MG tablet Take 2 tablets (40 mg total) by mouth daily. Patient taking differently: Take 20 mg by mouth daily. 01/24/23   Delma Freeze, FNP  TRULICITY 3 MG/0.5ML SOPN Inject 3 mg into the skin daily. 03/26/22   [provider]  VENTOLIN HFA 108 (90 Base) MCG/ACT inhaler INHALE 2 PUFFS INTO THE LUNGS EVERY 6  HOURS AS NEEDED FOR WHEEZING OR SHORTNESS OF BREATH 04/05/22   Danelle Berry, PA-C     Allergies  Oxycodone, Entresto [sacubitril-valsartan], and Onion   Family History   Family History  Problem Relation Age of Onset   Heart disease Mother    Diabetes Mother    Hyperlipidemia Mother    Hypertension Mother    Heart attack Mother    Lung cancer Father    Hypertension Father    Stroke Father    AAA (abdominal aortic aneurysm) Maternal Grandmother    Heart attack Maternal Grandmother    Diabetes Maternal Grandfather      Physical Exam  Triage Vital Signs: ED Triage Vitals  Enc Vitals Group     BP 03/21/23 2020 (!) 147/72     Pulse Rate 03/21/23 2020 75     Resp 03/21/23 2020 20     Temp 03/21/23 2022 97.9 F (36.6 C)     Temp Source 03/21/23 2020 Oral     SpO2 03/21/23 2020 95 %     Weight 03/21/23 2021 225 lb (102.1 kg)     Height 03/21/23 2021 5\' 7"  (1.702 m)     Head Circumference --      Peak Flow --      Pain Score 03/21/23  2021 7     Pain Loc --      Pain Edu? --      Excl. in GC? --     Updated Vital Signs: BP 113/68   Pulse 60   Temp 97.9 F (36.6 C)   Resp 18   Ht 5\' 7"  (1.702 m)   Wt 102.1 kg   SpO2 97%   BMI 35.24 kg/m    General: Awake, no distress.  CV:  RRR.  Good peripheral perfusion.  Resp:  Normal effort.  CTAB. Abd:  Nontender.  No distention.  Other:  No pedal edema.   ED Results / Procedures / Treatments  Labs (all labs ordered are listed, but only abnormal results are displayed) Labs Reviewed  BASIC METABOLIC PANEL - Abnormal; Notable for the following components:      Result Value   Sodium 134 (*)    Chloride 97 (*)    CO2 21 (*)    Glucose, Bld 449 (*)    Creatinine, Ser 1.45 (*)    GFR, Estimated 57 (*)    Anion gap 16 (*)    All other components within normal limits  CBC - Abnormal; Notable for the following components:   WBC 11.3 (*)    RBC 5.87 (*)    Hemoglobin 17.1 (*)    All other components within normal limits  LIPASE, BLOOD - Abnormal; Notable for the following components:   Lipase 74 (*)    All other components within normal limits  CBG MONITORING, ED - Abnormal; Notable for the following components:   Glucose-Capillary 329 (*)    All other components within normal limits  CBG MONITORING, ED - Abnormal; Notable for the following components:   Glucose-Capillary 320 (*)    All other components within normal limits  TROPONIN I (HIGH SENSITIVITY) - Abnormal; Notable for the following components:   Troponin I (High Sensitivity) 20 (*)    All other components within normal limits  BRAIN NATRIURETIC PEPTIDE  HEPATIC FUNCTION PANEL  D-DIMER, QUANTITATIVE  TROPONIN I (HIGH SENSITIVITY)     EKG  ED ECG REPORT I, Kaydynce Pat J, the attending physician, personally viewed and interpreted this ECG.  Date: 03/22/2023  EKG Time: 2018  Rate: 83  Rhythm: normal sinus rhythm  Axis: Normal  Intervals: PACs  ST&T Change: Nonspecific    RADIOLOGY I  have independently visualized and interpreted patient's x-ray as well as noted the radiology interpretation:  Chest x-ray: No acute cardiopulmonary process  Official radiology report(s): DG Chest 2 View  Result Date: 03/21/2023 CLINICAL DATA:  Chest pain EXAM: CHEST - 2 VIEW COMPARISON:  CT and radiograph 03/09/2023 FINDINGS: Stable cardiomediastinal silhouette. No focal consolidation, pleural effusion, or pneumothorax. No displaced rib fractures. Cervical spine fusion hardware. IMPRESSION: No active cardiopulmonary disease. Electronically Signed   By: Minerva Fester M.D.   On: 03/21/2023 21:05     PROCEDURES:  Critical Care performed: No  .1-3 Lead EKG Interpretation  Performed by: Irean Hong, MD Authorized by: Irean Hong, MD     Interpretation: normal     ECG rate:  75   ECG rate assessment: normal     Rhythm: sinus rhythm     Ectopy: PAC     Conduction: normal   Comments:     Patient placed on cardiac monitor to evaluate for arrhythmias    MEDICATIONS ORDERED IN ED: Medications  sodium chloride 0.9 % bolus 500 mL (0 mLs Intravenous Stopped 03/22/23 0151)  famotidine (PEPCID) IVPB 20 mg premix (0 mg Intravenous Stopped 03/22/23 0129)  insulin aspart (novoLOG) injection 10 Units (10 Units Subcutaneous Given 03/22/23 0101)     IMPRESSION / MDM / ASSESSMENT AND PLAN / ED COURSE  I reviewed the triage vital signs and the nursing notes.                             57 year old male presenting with chest pain. Differential diagnosis includes, but is not limited to, ACS, aortic dissection, pulmonary embolism, cardiac tamponade, pneumothorax, pneumonia, pericarditis, myocarditis, GI-related causes including esophagitis/gastritis, and musculoskeletal chest wall pain.   I personally reviewed patient's records and note a PCP office visit yesterday for follow-up chronic medical problems.  Patient's presentation is most consistent with acute presentation with potential threat to life or  bodily function.  The patient is on the cardiac monitor to evaluate for evidence of arrhythmia and/or significant heart rate changes.  Laboratory results demonstrate hemoconcentration, hyperglycemia with mild AKI and minimal elevation of anion gap.  Stable troponins.  X-ray negative.  Will add D-dimer given pleuritic nature of patient's pain, judicious IV fluids, insulin and Pepcid.  Will reassess.  Clinical Course as of 03/22/23 0518  Fri Mar 22, 2023  0154 Patient resting in no acute distress.  D-dimer is negative.  Trigger improved.  Return precautions given.  Patient verbalizes understanding and agrees with plan of care. [JS]    Clinical Course User Index [JS] Irean Hong, MD     FINAL CLINICAL IMPRESSION(S) / ED DIAGNOSES   Final diagnoses:  Nonspecific chest pain  Hyperglycemia     Rx / DC Orders   ED Discharge Orders     None        Note:  This document was prepared using Dragon voice recognition software and may include unintentional dictation errors.   Irean Hong, MD 03/22/23 [phone]

## 2023-03-22 NOTE — Telephone Encounter (Signed)
Lab review

## 2023-03-22 NOTE — ED Notes (Signed)
PT brought to ed rm 10 at this time, this RN now assuming care.  

## 2023-03-22 NOTE — ED Triage Notes (Signed)
Pt to ed from home for high BGL. Pt advised it was over 200 tonight. Pt was just seen last night for same. Pt is caox4, in no acute distress and ambulatory in triage.

## 2023-03-22 NOTE — Discharge Instructions (Addendum)
Take your medications daily as directed by your doctor.  Keep an eye on your blood sugar.  Return to the ER for worsening symptoms, persistent vomiting, difficulty breathing or other concerns.

## 2023-03-22 NOTE — ED Notes (Signed)
ED Provider at bedside. 

## 2023-03-22 NOTE — ED Notes (Signed)
CBG 195 in triage 

## 2023-03-23 ENCOUNTER — Emergency Department
Admission: EM | Admit: 2023-03-23 | Discharge: 2023-03-23 | Disposition: A | Discharge status: Home / Self Care | Payer: Medicaid Other | Source: Home / Self Care | Attending: Emergency Medicine | Admitting: Emergency Medicine

## 2023-03-23 DIAGNOSIS — R739 Hyperglycemia, unspecified: Secondary | ICD-10-CM

## 2023-03-23 DIAGNOSIS — R112 Nausea with vomiting, unspecified: Secondary | ICD-10-CM

## 2023-03-23 LAB — CBG MONITORING, ED: Glucose-Capillary: 191 mg/dL — ABNORMAL HIGH (ref 70–99)

## 2023-03-23 MED ORDER — ONDANSETRON 4 MG PO TBDP: 4.0000 mg | ORAL_TABLET | Freq: Three times a day (TID) | ORAL | 0 refills | Status: DC | PRN

## 2023-03-23 NOTE — ED Provider Notes (Signed)
[facility] Provider Note    Event Date/Time   First MD Initiated Contact with Patient 03/23/23 0236     (approximate)   History   Hyperglycemia   HPI  Cory Rivas is a 57 y.o. male who returns to the ED from home for hyperglycemia and 1 episode of emesis.  Patient was seen in the ED last evening for substernal chest/epigastric pain.  Blood sugar was over 400 and brought down to the 300s prior to discharge.  Patient states he had 1 episode of vomiting today and nausea has currently resolved.  Denies fever/chills, chest pain, shortness of breath, abdominal pain, dysuria or diarrhea.     Past Medical History   Past Medical History:  Diagnosis Date   Bulging of cervical intervertebral disc    CHF (congestive heart failure) (HCC)    Diabetes mellitus without complication (HCC)    Gout    High cholesterol    Hypertension    Stress due to family tension 04/15/2019     Active Problem List   Patient Active Problem List   Diagnosis Date Noted   Acute decompensated heart failure (HCC) 11/23/2022   Hypertensive emergency 11/22/2022   Acute respiratory failure with hypoxia (HCC) 11/22/2022   Elevated troponin 11/22/2022   CHF exacerbation (HCC) 08/24/2022   Acute on chronic diastolic congestive heart failure (HCC) 08/23/2022   Class 2 obesity 08/23/2022   Type 2 diabetes mellitus with hyperglycemia (HCC) 08/23/2022   Grade I diastolic dysfunction 06/08/2022   LVH (left ventricular hypertrophy) 06/08/2022   Accelerated hypertension 06/08/2022   S/P cervical spinal fusion 08/18/2021   Former heavy tobacco smoker 08/18/2021   Gout 08/18/2021   Other chronic pain 10/26/2020   Systolic murmur 10/26/2020   Tobacco use 10/26/2020   Polyp of ascending colon    Rectal polyp    Class 2 severe obesity with serious comorbidity and body mass index (BMI) of 39.0 to 39.9 in adult [facility]) 04/15/2019   Chronic kidney disease (CKD) stage G2/A3, mildly decreased  glomerular filtration rate (GFR) between 60-89 mL/min/1.73 square meter and albuminuria creatinine ratio greater than 300 mg/g 04/15/2019   Proteinuria 04/15/2019   Hyperlipidemia associated with type 2 diabetes mellitus (HCC) 03/18/2019   Diabetes mellitus (HCC) 03/18/2019   Idiopathic chronic gout of multiple sites without tophus 03/18/2019   Essential hypertension 03/18/2019   Dermatitis 03/18/2019   Lumbar degenerative disc disease 11/22/2015   Cervical myelopathy (HCC) 11/22/2015   Osteoarthritis of spine with radiculopathy, lumbar region 11/22/2015     Past Surgical History   Past Surgical History:  Procedure Laterality Date   CHOLECYSTECTOMY     COLONOSCOPY WITH PROPOFOL N/A 03/10/2020   Procedure: COLONOSCOPY WITH PROPOFOL;  Surgeon: Midge Minium, MD;  Location: [facility] ENDOSCOPY;  Service: Endoscopy;  Laterality: N/A;     Home Medications   Prior to Admission medications   Medication Sig Start Date End Date Taking? Authorizing Provider  allopurinol (ZYLOPRIM) 100 MG tablet TAKE ONE TABLET BY MOUTH ONCE A DAY. TAKE WITH 300 MG TABLETS 01/23/23   Danelle Berry, PA-C  allopurinol (ZYLOPRIM) 300 MG tablet TAKE ONE TABLET BY MOUTH DAILY WITH 100 MG TABLET. FOR A TOTAL OF 400 MG DAILY FOR GOUT 03/05/23   Danelle Berry, PA-C  amLODipine (NORVASC) 10 MG tablet TAKE ONE TABLET BY MOUTH DAILY 01/11/23   Danelle Berry, PA-C  Baclofen 5 MG TABS Take 1-2 tablets (5-10 mg total) by mouth 3 (three) times daily as needed (msk pain  or spasms). Patient taking differently: Take 10 mg by mouth 3 (three) times daily as needed (msk pain or spasms). 02/22/23   Danelle Berry, PA-C  carvedilol (COREG) 6.25 MG tablet Take 1 tablet (6.25 mg total) by mouth 2 (two) times daily. 02/21/23   Debbe Odea, MD  DULoxetine (CYMBALTA) 30 MG capsule TAKE ONE CAPSULE BY MOUTH TWO TIMES DAILY Patient taking differently: Take 30 mg by mouth daily as needed. 01/11/23   Danelle Berry, PA-C  ezetimibe (ZETIA) 10 MG tablet  TAKE ONE TABLET BY MOUTH DAILY 01/11/23   Danelle Berry, PA-C  fluticasone (FLONASE) 50 MCG/ACT nasal spray Place 1 spray into both nostrils daily. Patient taking differently: Place 1 spray into both nostrils daily as needed for rhinitis or allergies. 01/23/23 01/23/24  Danelle Berry, PA-C  insulin glargine (LANTUS SOLOSTAR) 100 UNIT/ML Solostar Pen Inject 20 Units into the skin at bedtime. 11/01/20   [provider]  insulin lispro (HUMALOG) 100 UNIT/ML KwikPen Inject 1-45 Units into the skin 3 (three) times daily. Sliding scale up to 45 units per injection 11/08/20   [provider]  ipratropium-albuterol (DUONEB) 0.5-2.5 (3) MG/3ML SOLN Take 3 mLs by nebulization every 6 (six) hours as needed (SOB wheeze cough). 07/23/22   Danelle Berry, PA-C  JARDIANCE 10 MG TABS tablet TAKE ONE TABLET BY MOUTH DAILY BEFORE BREAKFAST 01/11/23   Clarisa Kindred A, FNP  levocetirizine (XYZAL) 5 MG tablet TAKE ONE TABLET BY MOUTH EVERY EVENING Patient taking differently: Take 5 mg by mouth daily as needed for allergies. 01/11/23   Danelle Berry, PA-C  lidocaine (LIDODERM) 5 % Place 1 patch onto the skin every 12 (twelve) hours. Remove & Discard patch within 12 hours or as directed by MD 03/01/23 02/29/24  Phineas Semen, MD  lisinopril (ZESTRIL) 40 MG tablet Take 1 tablet (40 mg total) by mouth daily. 02/21/23 05/22/23  Debbe Odea, MD  metFORMIN (GLUCOPHAGE) 1000 MG tablet TAKE ONE TABLET BY MOUTH TWO TIMES A DAY6 WITHA MEAL Patient taking differently: Take 1,000 mg by mouth at bedtime. 01/11/23   Danelle Berry, PA-C  nicotine polacrilex (GNP NICOTINE POLACRILEX) 2 MG lozenge TAKE ONE LOZENGE (2MG  TOTAL) BY MOUTH EVERY TWO HOURS AS NEEDED FOR SMOKING CESSATION Patient taking differently: Take 2 mg by mouth as needed for smoking cessation. 01/17/23   Raechel Chute, MD  ondansetron (ZOFRAN-ODT) 4 MG disintegrating tablet Take 1-2 tablets (4-8 mg total) by mouth every 8 (eight) hours as needed for nausea or  vomiting. 02/18/23   Danelle Berry, PA-C  potassium chloride SA (KLOR-CON M) 20 MEQ tablet Take 1 tablet (20 mEq total) by mouth 2 (two) times daily. Take with furosemide 01/10/23   Clarisa Kindred A, FNP  pregabalin (LYRICA) 50 MG capsule Take 1 capsule (50 mg total) by mouth 2 (two) times daily. 02/22/23   Danelle Berry, PA-C  rosuvastatin (CRESTOR) 40 MG tablet TAKE ONE TABLET BY MOUTH DAILY 01/11/23   Danelle Berry, PA-C  Tiotropium Bromide Monohydrate (SPIRIVA RESPIMAT) 2.5 MCG/ACT AERS Inhale 2 puffs into the lungs daily. 09/24/22   Raechel Chute, MD  torsemide (DEMADEX) 20 MG tablet Take 2 tablets (40 mg total) by mouth daily. Patient taking differently: Take 20 mg by mouth daily. 01/24/23   Delma Freeze, FNP  TRULICITY 3 MG/0.5ML SOPN Inject 3 mg into the skin daily. 03/26/22   [provider]  VENTOLIN HFA 108 (90 Base) MCG/ACT inhaler INHALE 2 PUFFS INTO THE LUNGS EVERY 6 HOURS AS NEEDED FOR WHEEZING OR SHORTNESS OF  BREATH 04/05/22   Danelle Berry, PA-C     Allergies  Oxycodone, Entresto [sacubitril-valsartan], and Onion   Family History   Family History  Problem Relation Age of Onset   Heart disease Mother    Diabetes Mother    Hyperlipidemia Mother    Hypertension Mother    Heart attack Mother    Lung cancer Father    Hypertension Father    Stroke Father    AAA (abdominal aortic aneurysm) Maternal Grandmother    Heart attack Maternal Grandmother    Diabetes Maternal Grandfather      Physical Exam  Triage Vital Signs: ED Triage Vitals [03/22/23 2254]  Enc Vitals Group     BP (!) 135/93     Pulse Rate 67     Resp 16     Temp 98 F (36.7 C)     Temp Source Oral     SpO2 98 %     Weight 224 lb 13.9 oz (102 kg)     Height 5\' 7"  (1.702 m)     Head Circumference      Peak Flow      Pain Score 0     Pain Loc      Pain Edu?      Excl. in GC?     Updated Vital Signs: BP (!) 135/93   Pulse 67   Temp 98 F (36.7 C) (Oral)   Resp 16   Ht 5\' 7"  (1.702 m)    Wt 102 kg   SpO2 98%   BMI 35.22 kg/m    General: Awake, no distress.  CV:  RRR.  Good peripheral perfusion.  Resp:  Normal effort.  CTAB. Abd:  Nontender to light or deep palpation.  No distention.  Other:  No truncal vesicles.  No pedal edema.   ED Results / Procedures / Treatments  Labs (all labs ordered are listed, but only abnormal results are displayed) Labs Reviewed  BASIC METABOLIC PANEL - Abnormal; Notable for the following components:      Result Value   Glucose, Bld 171 (*)    Creatinine, Ser 1.51 (*)    GFR, Estimated 54 (*)    All other components within normal limits  CBC - Abnormal; Notable for the following components:   WBC 12.0 (*)    RBC 5.95 (*)    Hemoglobin 17.4 (*)    All other components within normal limits  CBG MONITORING, ED - Abnormal; Notable for the following components:   Glucose-Capillary 195 (*)    All other components within normal limits  CBG MONITORING, ED  CBG MONITORING, ED  CBG MONITORING, ED     EKG  None   RADIOLOGY None   Official radiology report(s): No results found.   PROCEDURES:  Critical Care performed: No  Procedures   MEDICATIONS ORDERED IN ED: Medications - No data to display   IMPRESSION / MDM / ASSESSMENT AND PLAN / ED COURSE  I reviewed the triage vital signs and the nursing notes.                             57 year old male who returns to the ED for emesis and hyperglycemia. Differential diagnosis includes, but is not limited to, biliary disease (biliary colic, acute cholecystitis, cholangitis, choledocholithiasis, etc), intrathoracic causes for epigastric abdominal pain including ACS, gastritis, duodenitis, pancreatitis, small bowel or large bowel obstruction, abdominal aortic aneurysm, hernia, and ulcer(s).  I reviewed  patient's records and note his ED visit and test results from yesterday's visit.  Patient's presentation is most consistent with acute illness / injury with system  symptoms.  Laboratory results stable from prior.  Recheck FSBS 181 which is most improved from last evening when his sugar was over 400.  Patient voices no complaints of pain, nausea or vomiting.  Will discharge home with prescription for Zofran as needed.  Strict return precautions given.  Patient verbalizes understanding and agrees with plan of care.      FINAL CLINICAL IMPRESSION(S) / ED DIAGNOSES   Final diagnoses:  Hyperglycemia  Nausea and vomiting, unspecified vomiting type     Rx / DC Orders   ED Discharge Orders     None        Note:  This document was prepared using Dragon voice recognition software and may include unintentional dictation errors.   Irean Hong, MD 03/23/23 [phone]

## 2023-03-23 NOTE — Discharge Instructions (Signed)
You may take Zofran as needed for nausea.  Take your medications daily as directed by your doctor.  Return to the ER for worsening symptoms, persistent vomiting, difficulty breathing or other concerns.

## 2023-03-25 ENCOUNTER — Ambulatory Visit: Payer: Medicaid Other | Admitting: Family Medicine

## 2023-03-25 ENCOUNTER — Other Ambulatory Visit
Admission: RE | Admit: 2023-03-25 | Discharge: 2023-03-25 | Disposition: A | Discharge status: Home / Self Care | Payer: Medicaid Other | Attending: Family Medicine | Admitting: Family Medicine

## 2023-03-25 ENCOUNTER — Ambulatory Visit: Payer: Self-pay

## 2023-03-25 ENCOUNTER — Encounter: Payer: Self-pay | Admitting: Family Medicine

## 2023-03-25 VITALS — BP 136/82 | HR 85 | Temp 97.7°F | Resp 16 | Ht 67.0 in | Wt 224.0 lb

## 2023-03-25 DIAGNOSIS — R739 Hyperglycemia, unspecified: Secondary | ICD-10-CM

## 2023-03-25 DIAGNOSIS — R112 Nausea with vomiting, unspecified: Secondary | ICD-10-CM | POA: Diagnosis not present

## 2023-03-25 DIAGNOSIS — R42 Dizziness and giddiness: Secondary | ICD-10-CM | POA: Diagnosis not present

## 2023-03-25 DIAGNOSIS — Z09 Encounter for follow-up examination after completed treatment for conditions other than malignant neoplasm: Secondary | ICD-10-CM

## 2023-03-25 DIAGNOSIS — R5383 Other fatigue: Secondary | ICD-10-CM | POA: Diagnosis not present

## 2023-03-25 DIAGNOSIS — R310 Gross hematuria: Secondary | ICD-10-CM

## 2023-03-25 DIAGNOSIS — R079 Chest pain, unspecified: Secondary | ICD-10-CM | POA: Diagnosis not present

## 2023-03-25 DIAGNOSIS — T50905A Adverse effect of unspecified drugs, medicaments and biological substances, initial encounter: Secondary | ICD-10-CM

## 2023-03-25 DIAGNOSIS — I10 Essential (primary) hypertension: Secondary | ICD-10-CM

## 2023-03-25 DIAGNOSIS — R1013 Epigastric pain: Secondary | ICD-10-CM

## 2023-03-25 LAB — COMPREHENSIVE METABOLIC PANEL
ALT: 22 U/L (ref 0–44)
AST: 25 U/L (ref 15–41)
Albumin: 4.2 g/dL (ref 3.5–5.0)
Alkaline Phosphatase: 79 U/L (ref 38–126)
Anion gap: 13 (ref 5–15)
BUN: 18 mg/dL (ref 6–20)
CO2: 24 mmol/L (ref 22–32)
Calcium: 8.7 mg/dL — ABNORMAL LOW (ref 8.9–10.3)
Chloride: 102 mmol/L (ref 98–111)
Creatinine, Ser: 1.27 mg/dL — ABNORMAL HIGH (ref 0.61–1.24)
GFR, Estimated: >60 mL/min (ref >60)
Glucose, Bld: 182 mg/dL — ABNORMAL HIGH (ref 70–99)
Potassium: 3.9 mmol/L (ref 3.5–5.1)
Sodium: 139 mmol/L (ref 135–145)
Total Bilirubin: 1 mg/dL (ref 0.3–1.2)
Total Protein: 7.6 g/dL (ref 6.5–8.1)

## 2023-03-25 LAB — CBC WITH DIFFERENTIAL/PLATELET
Abs Immature Granulocytes: 0.03 10*3/uL (ref 0.00–0.07)
Basophils Absolute: 0 10*3/uL (ref 0.0–0.1)
Basophils Relative: 0 %
Eosinophils Absolute: 0.1 10*3/uL (ref 0.0–0.5)
Eosinophils Relative: 1 %
HCT: 47.2 % (ref 39.0–52.0)
Hemoglobin: 16.2 g/dL (ref 13.0–17.0)
Immature Granulocytes: 0 %
Lymphocytes Relative: 15 %
Lymphs Abs: 1.8 10*3/uL (ref 0.7–4.0)
MCH: 28.9 pg (ref 26.0–34.0)
MCHC: 34.3 g/dL (ref 30.0–36.0)
MCV: 84.3 fL (ref 80.0–100.0)
Monocytes Absolute: 0.5 10*3/uL (ref 0.1–1.0)
Monocytes Relative: 4 %
Neutro Abs: 10 10*3/uL — ABNORMAL HIGH (ref 1.7–7.7)
Neutrophils Relative %: 80 %
Platelets: 224 10*3/uL (ref 150–400)
RBC: 5.6 MIL/uL (ref 4.22–5.81)
RDW: 13.3 % (ref 11.5–15.5)
WBC: 12.5 10*3/uL — ABNORMAL HIGH (ref 4.0–10.5)
nRBC: 0 % (ref 0.0–0.2)

## 2023-03-25 LAB — LIPASE, BLOOD: Lipase: 71 U/L — ABNORMAL HIGH (ref 11–51)

## 2023-03-25 LAB — URINALYSIS, ROUTINE W REFLEX MICROSCOPIC
Bilirubin Urine: NEGATIVE
Glucose, UA: 150 mg/dL — AB
Hgb urine dipstick: NEGATIVE
Ketones, ur: NEGATIVE mg/dL
Leukocytes,Ua: NEGATIVE
Nitrite: NEGATIVE
Protein, ur: >=300 mg/dL — AB
Specific Gravity, Urine: 1.027 (ref 1.005–1.030)
pH: 5 (ref 5.0–8.0)

## 2023-03-25 MED ORDER — ONDANSETRON 4 MG PO TBDP
4.0000 mg | ORAL_TABLET | Freq: Three times a day (TID) | ORAL | 1 refills | Status: DC | PRN
Start: 2023-03-25 — End: 2023-04-22

## 2023-03-25 MED ORDER — FAMOTIDINE 20 MG PO TABS
20.0000 mg | ORAL_TABLET | Freq: Two times a day (BID) | ORAL | 0 refills | Status: DC
Start: 2023-03-25 — End: 2023-04-22

## 2023-03-25 MED ORDER — PANTOPRAZOLE SODIUM 40 MG PO TBEC
40.0000 mg | DELAYED_RELEASE_TABLET | Freq: Two times a day (BID) | ORAL | 0 refills | Status: DC
Start: 2023-03-25 — End: 2023-06-21

## 2023-03-25 NOTE — Telephone Encounter (Signed)
  Chief Complaint: Higher than normal blood sugar - vomiting last night Symptoms: above Frequency: past few days Pertinent Negatives: Patient denies  Disposition: [] ED /[] Urgent Care (no appt availability in office) / [x] Appointment(In office/virtual)/ []  [facility]/ [] Home Care/ [] Refused Recommended Disposition /[] [facility]/ []  Follow-up with PCP Additional Notes: Pt has been to ed 2 x in the past few days with high blood sugars. Pt states he has not missed any medications. Pt vomited several times last night. Blood sugar this morning is 183, and pt is feeling a little better. Pt is unable to contact endocrinologist.   Reason for Disposition  Blood glucose 70-240 mg/dL (3.9 -62.1 mmol/L)  Answer Assessment - Initial Assessment Questions 1. BLOOD GLUCOSE: "What is your blood glucose level?"      183 2. ONSET: "When did you check the blood glucose?"     Just now 3. USUAL RANGE: "What is your glucose level usually?" (e.g., usual fasting morning value, usual evening value)     115-120 5. TYPE 1 or 2:  "Do you know what type of diabetes you have?"  (e.g., Type 1, Type 2, Gestational; doesn't know)      Type 2 6. INSULIN: "Do you take insulin?" "What type of insulin(s) do you use? What is the mode of delivery? (syringe, pen; injection or pump)?"      Main at night 7. DIABETES PILLS: "Do you take any pills for your diabetes?" If Yes, ask: "Have you missed taking any pills recently?"     3 times a day 8. OTHER SYMPTOMS: "Do you have any symptoms?" (e.g., fever, frequent urination, difficulty breathing, dizziness, weakness, vomiting)     Vomiting  Protocols used: Diabetes - High Blood Sugar-A-AH

## 2023-03-25 NOTE — Progress Notes (Signed)
Patient ID: Cory Rivas, male    DOB: [birth year], 57 y.o.   MRN: [id number]  PCP: Danelle Berry, PA-C  Chief Complaint  Patient presents with   Hyperglycemia   Emesis    X3 days   Fatigue    X3 days. "Not normal for me"    Subjective:   Cory Rivas is a 57 y.o. male, presents to clinic with CC of the following:  HPI  PT presents with about 4 days of nausea, vomiting, fatigue and dizziness.  It started Fri/Saturday and got worse this am  He is really thirsty when he goes to sleep and wakes up throwing up  Last blood sugar 180's this am and then 132   Went to the ER on Friday and Saturday for CP, N/V hyperglycemia, dizziness and blurred vision He had high blood sugars, was given fluids and zofran - since ED visits x 2 he has only taken 2 doses of zofran.  He has been able to eat twice including once some chilli another time a pickle  Dizzy, cold, super fatigued, BP softer than normal, extremely tired, epigastric and chest pain ER x 2   I reviewed ED visit encounters today - notes, imaging, lab results  Pt endorses pain in epigastrum and chest - feels light burning and is also tight, worse after vomiting He previously had Ppi and has hx of GERD but is not currently on meds.     Patient Active Problem List   Diagnosis Date Noted   Acute decompensated heart failure (HCC) 11/23/2022   Hypertensive emergency 11/22/2022   Acute respiratory failure with hypoxia (HCC) 11/22/2022   Elevated troponin 11/22/2022   CHF exacerbation (HCC) 08/24/2022   Acute on chronic diastolic congestive heart failure (HCC) 08/23/2022   Class 2 obesity 08/23/2022   Type 2 diabetes mellitus with hyperglycemia (HCC) 08/23/2022   Grade I diastolic dysfunction 06/08/2022   LVH (left ventricular hypertrophy) 06/08/2022   Accelerated hypertension 06/08/2022   S/P cervical spinal fusion 08/18/2021   Former heavy tobacco smoker 08/18/2021   Gout 08/18/2021   Other chronic pain 10/26/2020    Systolic murmur 10/26/2020   Tobacco use 10/26/2020   Polyp of ascending colon    Rectal polyp    Class 2 severe obesity with serious comorbidity and body mass index (BMI) of 39.0 to 39.9 in adult [facility]) 04/15/2019   Chronic kidney disease (CKD) stage G2/A3, mildly decreased glomerular filtration rate (GFR) between 60-89 mL/min/1.73 square meter and albuminuria creatinine ratio greater than 300 mg/g 04/15/2019   Proteinuria 04/15/2019   Hyperlipidemia associated with type 2 diabetes mellitus (HCC) 03/18/2019   Diabetes mellitus (HCC) 03/18/2019   Idiopathic chronic gout of multiple sites without tophus 03/18/2019   Essential hypertension 03/18/2019   Dermatitis 03/18/2019   Lumbar degenerative disc disease 11/22/2015   Cervical myelopathy (HCC) 11/22/2015   Osteoarthritis of spine with radiculopathy, lumbar region 11/22/2015      Current Outpatient Medications:    allopurinol (ZYLOPRIM) 100 MG tablet, TAKE ONE TABLET BY MOUTH ONCE A DAY. TAKE WITH 300 MG TABLETS, Disp: 90 tablet, Rfl: 1   allopurinol (ZYLOPRIM) 300 MG tablet, TAKE ONE TABLET BY MOUTH DAILY WITH 100 MG TABLET. FOR A TOTAL OF 400 MG DAILY FOR GOUT, Disp: 90 tablet, Rfl: 1   amLODipine (NORVASC) 10 MG tablet, TAKE ONE TABLET BY MOUTH DAILY, Disp: 90 tablet, Rfl: 3   Baclofen 5 MG TABS, Take 1-2 tablets (5-10 mg total) by mouth 3 (three)  times daily as needed (msk pain or spasms). (Patient taking differently: Take 10 mg by mouth 3 (three) times daily as needed (msk pain or spasms).), Disp: 90 tablet, Rfl: 3   carvedilol (COREG) 6.25 MG tablet, Take 1 tablet (6.25 mg total) by mouth 2 (two) times daily., Disp: 180 tablet, Rfl: 3   DULoxetine (CYMBALTA) 30 MG capsule, TAKE ONE CAPSULE BY MOUTH TWO TIMES DAILY (Patient taking differently: Take 30 mg by mouth daily as needed.), Disp: 180 capsule, Rfl: 1   ezetimibe (ZETIA) 10 MG tablet, TAKE ONE TABLET BY MOUTH DAILY, Disp: 90 tablet, Rfl: 3   fluticasone (FLONASE) 50 MCG/ACT nasal  spray, Place 1 spray into both nostrils daily. (Patient taking differently: Place 1 spray into both nostrils daily as needed for rhinitis or allergies.), Disp: 18.2 mL, Rfl: 6   insulin glargine (LANTUS SOLOSTAR) 100 UNIT/ML Solostar Pen, Inject 20 Units into the skin at bedtime., Disp: , Rfl:    insulin lispro (HUMALOG) 100 UNIT/ML KwikPen, Inject 1-45 Units into the skin 3 (three) times daily. Sliding scale up to 45 units per injection, Disp: , Rfl:    ipratropium-albuterol (DUONEB) 0.5-2.5 (3) MG/3ML SOLN, Take 3 mLs by nebulization every 6 (six) hours as needed (SOB wheeze cough)., Disp: 180 mL, Rfl: 1   JARDIANCE 10 MG TABS tablet, TAKE ONE TABLET BY MOUTH DAILY BEFORE BREAKFAST, Disp: 30 tablet, Rfl: 3   levocetirizine (XYZAL) 5 MG tablet, TAKE ONE TABLET BY MOUTH EVERY EVENING, Disp: 90 tablet, Rfl: 1   lidocaine (LIDODERM) 5 %, Place 1 patch onto the skin every 12 (twelve) hours. Remove & Discard patch within 12 hours or as directed by MD, Disp: 10 patch, Rfl: 0   lisinopril (ZESTRIL) 40 MG tablet, Take 1 tablet (40 mg total) by mouth daily., Disp: 90 tablet, Rfl: 3   metFORMIN (GLUCOPHAGE) 1000 MG tablet, TAKE ONE TABLET BY MOUTH TWO TIMES A DAY6 WITHA MEAL (Patient taking differently: Take 1,000 mg by mouth at bedtime.), Disp: 180 tablet, Rfl: 3   nicotine polacrilex (GNP NICOTINE POLACRILEX) 2 MG lozenge, TAKE ONE LOZENGE (2MG  TOTAL) BY MOUTH EVERY TWO HOURS AS NEEDED FOR SMOKING CESSATION (Patient taking differently: Take 2 mg by mouth as needed for smoking cessation.), Disp: 27 lozenge, Rfl: 6   ondansetron (ZOFRAN-ODT) 4 MG disintegrating tablet, Take 1 tablet (4 mg total) by mouth every 8 (eight) hours as needed for nausea or vomiting., Disp: 15 tablet, Rfl: 0   potassium chloride SA (KLOR-CON M) 20 MEQ tablet, Take 1 tablet (20 mEq total) by mouth 2 (two) times daily. Take with furosemide, Disp: 60 tablet, Rfl: 5   pregabalin (LYRICA) 50 MG capsule, Take 1 capsule (50 mg total) by mouth 2  (two) times daily., Disp: 60 capsule, Rfl: 2   rosuvastatin (CRESTOR) 40 MG tablet, TAKE ONE TABLET BY MOUTH DAILY, Disp: 90 tablet, Rfl: 3   Tiotropium Bromide Monohydrate (SPIRIVA RESPIMAT) 2.5 MCG/ACT AERS, Inhale 2 puffs into the lungs daily., Disp: 30 each, Rfl: 11   torsemide (DEMADEX) 20 MG tablet, Take 2 tablets (40 mg total) by mouth daily. (Patient taking differently: Take 20 mg by mouth daily.), Disp: 60 tablet, Rfl: 5   TRULICITY 3 MG/0.5ML SOPN, Inject 3 mg into the skin daily., Disp: , Rfl:    VENTOLIN HFA 108 (90 Base) MCG/ACT inhaler, INHALE 2 PUFFS INTO THE LUNGS EVERY 6 HOURS AS NEEDED FOR WHEEZING OR SHORTNESS OF BREATH, Disp: 18 g, Rfl: 2 No current facility-administered medications for this visit.  Facility-Administered Medications Ordered in Other Visits:    albuterol (PROVENTIL) (2.5 MG/3ML) 0.083% nebulizer solution 2.5 mg, 2.5 mg, Nebulization, Once, Raechel Chute, MD   Allergies  Allergen Reactions   Oxycodone Nausea And Vomiting   Entresto [Sacubitril-Valsartan] Nausea Only and Other (See Comments)    Lightheaded and dizzy    Onion Nausea And Vomiting     Social History   Tobacco Use   Smoking status: Some Days    Packs/day: 1.00    Years: 37.00    Additional pack years: 0.00    Total pack years: 37.00    Types: Cigarettes    Start date: 10/19/1989    Last attempt to quit: 08/20/2022    Years since quitting: 0.5   Smokeless tobacco: Never   Tobacco comments:    30+ years hx smoking 2 ppd, stopped smoking briefly in April 2022 for a surgery and then restarted smoking about 1/2 ppd last 4-5 months     Quit smoking 08/20/2022  Vaping Use   Vaping Use: Never used  Substance Use Topics   Alcohol use: No   Drug use: No      Chart Review Today: I personally reviewed active problem list, medication list, allergies, family history, social history, health maintenance, notes from last encounter, lab results, imaging with the patient/caregiver today.    Review of Systems  Constitutional: Negative.   HENT: Negative.    Eyes: Negative.   Respiratory: Negative.    Cardiovascular: Negative.   Gastrointestinal: Negative.   Endocrine: Negative.   Genitourinary: Negative.   Musculoskeletal: Negative.   Skin: Negative.   Allergic/Immunologic: Negative.   Neurological: Negative.   Hematological: Negative.   Psychiatric/Behavioral: Negative.    All other systems reviewed and are negative.      Objective:   Vitals:   03/25/23 1145  BP: 136/82  Pulse: 85  Resp: 16  Temp: 97.7 F (36.5 C)  TempSrc: Oral  SpO2: 97%  Weight: 224 lb (101.6 kg)  Height: 5\' 7"  (1.702 m)    Body mass index is 35.08 kg/m.  Physical Exam Vitals and nursing note reviewed.  Constitutional:      General: He is not in acute distress.    Appearance: He is well-developed. He is obese. He is not toxic-appearing or diaphoretic.  HENT:     Head: Normocephalic and atraumatic.     Nose: Nose normal.  Eyes:     General: No scleral icterus.       Right eye: No discharge.        Left eye: No discharge.     Conjunctiva/sclera: Conjunctivae normal.  Neck:     Trachea: No tracheal deviation.  Cardiovascular:     Rate and Rhythm: Normal rate and regular rhythm.  Pulmonary:     Effort: Pulmonary effort is normal. No respiratory distress.     Breath sounds: No stridor.  Musculoskeletal:        General: Normal range of motion.  Skin:    General: Skin is warm and dry.     Findings: No rash.  Neurological:     Mental Status: He is alert.     Motor: No abnormal muscle tone.     Coordination: Coordination normal.     Gait: Gait normal.  Psychiatric:        Behavior: Behavior normal.      Results for orders placed or performed during the hospital encounter of 03/23/23  Basic metabolic panel  Result Value Ref Range  Sodium 138 135 - 145 mmol/L   Potassium 3.5 3.5 - 5.1 mmol/L   Chloride 100 98 - 111 mmol/L   CO2 25 22 - 32 mmol/L   Glucose, Bld  171 (H) 70 - 99 mg/dL   BUN 18 6 - 20 mg/dL   Creatinine, Ser 4.09 (H) 0.61 - 1.24 mg/dL   Calcium 9.1 8.9 - 81.1 mg/dL   GFR, Estimated 54 (L) >60 mL/min   Anion gap 13 5 - 15  CBC  Result Value Ref Range   WBC 12.0 (H) 4.0 - 10.5 K/uL   RBC 5.95 (H) 4.22 - 5.81 MIL/uL   Hemoglobin 17.4 (H) 13.0 - 17.0 g/dL   HCT 91.4 78.2 - 95.6 %   MCV 83.9 80.0 - 100.0 fL   MCH 29.2 26.0 - 34.0 pg   MCHC 34.9 30.0 - 36.0 g/dL   RDW 21.3 08.6 - 57.8 %   Platelets 247 150 - 400 K/uL   nRBC 0.0 0.0 - 0.2 %  CBG monitoring, ED  Result Value Ref Range   Glucose-Capillary 195 (H) 70 - 99 mg/dL  CBG monitoring, ED  Result Value Ref Range   Glucose-Capillary 191 (H) 70 - 99 mg/dL       Assessment & Plan:   1. Nausea and vomiting, unspecified vomiting type Onset 3-4 days ago He has not taken zofran or done slow advancement of diet as previously instructed Feeling worse We will get repeat labs to ensure he does not have AKI, or severe abnormalities of electrolytes, LFTs, blood counts etc, more zofran sent in with instructions reviewed in room and then printed for pt and reviewed again Also explained how to slowly progress diet He was advised to push clear fluids with some electrolytes first and to do that very slowly in 30 - 60 min do 4-8 oz, and then be able to tolerate liquids before pushing solids and avoid things like chilli - (only crackers toast - no fatty fried foods, spicy meals, acidic, high protein or fat content - very bland foods that are easy to digest first)  Also pt instructed to get and take pepcid and PPI BID for 1-2 weeks and then use zofran as prescribed  If he then fails outpt management or if labs are concerning then he was instructed to go back to ED - Comprehensive metabolic panel; Future - CBC with Differential/Platelet; Future - Urinalysis, Routine w reflex microscopic; Future - Lipase, blood; Future  2. Fatigue, unspecified type Likely due to acute illness and some  dehydration - Comprehensive metabolic panel; Future - CBC with Differential/Platelet; Future  3. Dizziness Suspect some mild hypovolemnia Pt VSS Labs from ED reviewed, will repeat labs at the hospital today - Comprehensive metabolic panel; Future - CBC with Differential/Platelet; Future  4. Hyperglycemia Pt has not gotten ahold of endocrinology Last SSI was printed out for him - encouraged him to use SSI with humalog and follow endo advice For now he is holding metformin due to GI upset - Comprehensive metabolic panel; Future  5. Gross hematuria He notes red/dark urine Will recheck urine with labs at hospital - Urinalysis, Routine w reflex microscopic; Future  6. Epigastric pain Likely inflamed after all this vomiting, Acute illness with hx of GERD - PPI BID and pepcid BID for a few weeks and then can take PPI once daily and reduce use of pepcid to prn - ondansetron (ZOFRAN-ODT) 4 MG disintegrating tablet; Take 1-2 tablets (4-8 mg total) by mouth every 8 (eight)  hours as needed for nausea or vomiting.  Dispense: 20 tablet; Refill: 1 - pantoprazole (PROTONIX) 40 MG tablet; Take 1 tablet (40 mg total) by mouth 2 (two) times daily.  Dispense: 60 tablet; Refill: 0 - famotidine (PEPCID) 20 MG tablet; Take 1 tablet (20 mg total) by mouth 2 (two) times daily.  Dispense: 60 tablet; Refill: 0  7. Chest pain, unspecified type CP evaluated on 6/7, 5/25 Unlikely to be cardiac etiology CT angio and work up was negative x 2 Worse with vomiting, suspect some gerd/gastritis/esophagitis, tx with PPI BID - pantoprazole (PROTONIX) 40 MG tablet; Take 1 tablet (40 mg total) by mouth 2 (two) times daily.  Dispense: 60 tablet; Refill: 0  8. Encounter for examination following treatment at hospital ED visit 6/7 and 6/8, both reviewed at length today with pt, reviewed labs and findings - Comprehensive metabolic panel; Future - CBC with Differential/Platelet; Future - Urinalysis, Routine w reflex  microscopic; Future - Lipase, blood; Future  Return for ER if not improving or if worsening.   Danelle Berry, PA-C 03/25/23 11:54 AM

## 2023-03-25 NOTE — Patient Instructions (Addendum)
Go to the [facility] and tell them you need to get your labs done at the outpatient lab I will call you later today with results  Start the pepcid and protonix today and take it morning and night for the next couple weeks. Take the zofran 1-2 tabs every 8 hours for nausea Take zofran and wait 20-30 minutes before drinking.  Make sure you can drink only 4-8 oz (less than one cup) and keep that down for 30 minutes before drinking more And make sure you can tolerate clear fluids before eating solids Start with crackers or toast - very bland food.    IF you cannot keep down fluids or food with these three medicines then you need to go back to the hospital   INSULIN INSTRUCTIONS:  (While sick and not eating you will hold this 8 units unless doing juice, crackers or more normal food) Humalog (short acting insulin) Inject 8 units before meals -------------------------------------------------------------------------------------------- Lantus (long acting insulin) Inject 20 units at bedtime -------------------------------------------------------------------------------------------- FOR HIGH BLOOD SUGARS before meals Humalog sliding scale: If blood sugars are: Add: 201-250--------------------->add 2 units 251-300--------------------->add 4 units 301-350--------------------->add 6 units  351-400--------------------->add 8 units  >400 --------------------->add 10 units

## 2023-03-26 ENCOUNTER — Telehealth: Payer: Self-pay

## 2023-03-26 NOTE — Transitions of Care (Post Inpatient/ED Visit) (Signed)
   03/26/2023  Name: CLAUDIA ALVIZO MRN: [id number] DOB: 1966/02/11  Today's TOC FU Call Status: Today's TOC FU Call Status:: Unsuccessul Call (1st Attempt) Unsuccessful Call (1st Attempt) Date: 03/26/23  Attempted to reach the patient regarding the most recent Inpatient/ED visit.  Follow Up Plan: Additional outreach attempts will be made to reach the patient to complete the Transitions of Care (Post Inpatient/ED visit) call.   Abelino Derrick, MHA [facility]  Managed [facility] Social Worker [phone]

## 2023-03-27 ENCOUNTER — Telehealth: Payer: Self-pay

## 2023-03-27 NOTE — Transitions of Care (Post Inpatient/ED Visit) (Signed)
   03/27/2023  Name: Cory Rivas MRN: [id number] DOB: 1966/03/14  Today's TOC FU Call Status: Today's TOC FU Call Status:: Unsuccessful Call (2nd Attempt) Unsuccessful Call (2nd Attempt) Date: 03/27/23  Attempted to reach the patient regarding the most recent Inpatient/ED visit.  Follow Up Plan: Additional outreach attempts will be made to reach the patient to complete the Transitions of Care (Post Inpatient/ED visit) call.   Abelino Derrick, MHA [facility]  Managed [facility] Social Worker [phone]

## 2023-03-28 ENCOUNTER — Telehealth: Payer: Self-pay

## 2023-03-28 ENCOUNTER — Encounter: Payer: Self-pay | Admitting: Family Medicine

## 2023-03-28 NOTE — Transitions of Care (Post Inpatient/ED Visit) (Signed)
   03/28/2023  Name: Cory Rivas MRN: [id number] DOB: 1966-05-22  Today's TOC FU Call Status: Today's TOC FU Call Status:: Unsuccessful Call (3rd Attempt) Unsuccessful Call (3rd Attempt) Date: 03/28/23  Attempted to reach the patient regarding the most recent Inpatient/ED visit.  Follow Up Plan: No further outreach attempts will be made at this time. We have been unable to contact the patient. Abelino Derrick, MHA [facility]  Managed [facility] Social Worker [phone]

## 2023-03-29 DIAGNOSIS — I10 Essential (primary) hypertension: Secondary | ICD-10-CM | POA: Diagnosis not present

## 2023-03-29 DIAGNOSIS — G4733 Obstructive sleep apnea (adult) (pediatric): Secondary | ICD-10-CM | POA: Diagnosis not present

## 2023-04-10 ENCOUNTER — Encounter: Payer: Self-pay | Admitting: Student in an Organized Health Care Education/Training Program

## 2023-04-10 ENCOUNTER — Ambulatory Visit: Payer: Medicaid Other | Admitting: Student in an Organized Health Care Education/Training Program

## 2023-04-10 VITALS — BP 144/90 | HR 60 | Temp 98.0°F | Ht 66.0 in | Wt 228.8 lb

## 2023-04-10 DIAGNOSIS — J398 Other specified diseases of upper respiratory tract: Secondary | ICD-10-CM | POA: Diagnosis not present

## 2023-04-10 DIAGNOSIS — J4530 Mild persistent asthma, uncomplicated: Secondary | ICD-10-CM | POA: Diagnosis not present

## 2023-04-10 DIAGNOSIS — Z87891 Personal history of nicotine dependence: Secondary | ICD-10-CM | POA: Diagnosis not present

## 2023-04-10 DIAGNOSIS — G4733 Obstructive sleep apnea (adult) (pediatric): Secondary | ICD-10-CM | POA: Diagnosis not present

## 2023-04-10 NOTE — Progress Notes (Signed)
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 Cory Rivas has a history of childhood asthma and is presenting for follow up. His main symptoms were those of shortness of breath, cough, and wheeze. He is now on triple therapy (Symbicort + Spiriva) given his smoking history. We also initiated CPAP and compliance report reviewed - patient not compliant with CPAP machine secondary to recent travel. Encouraged him to use his CPAP more frequently (for both OSA and tracheobronchomalacia).   My differential for Mr. Cory Rivas's symptoms is broad. He has a history of childhood asthma and could have been having asthma exacerbations; PFT's confirm reversible obstruction. Other obstructive diseases on the differential include COPD as well as a COPD/Asthma overlap syndrome. Furthermore, review of his chest CT is notable for significant narrowing of the left mainstem bronchus and is suggestive of tracheobronchomalacia.   PFT's are consistent with Asthma, and he is managed with LAMA/LABA/ICS. Furthermore, he has underwent a home sleep study with an AHI of 8.8. Given his history of heart failure as well as the tracheobronchomalacia, I will prescribe him a CPAP machine to help with his symptoms. I will consider a referral to a center with IP for possible rigid bronchoscopy with silicone Y-stent placement for assessment should he not have any improvement of symptoms in the future. I have encouraged him to use his CPAP machine regularly.   -continue LAMA (Spiriva) AND LABA/ICS (Symbicort) -continue PRN SABA (albuterol) -continue CPAP, auto-titrate 6 cm - 20 cm H2O, compliance encouraged -continue levocetirizine -weight loss encouraged   #Former heavy tobacco smoker   Continue nicotine replacement therapy   Return in about 6 months (around 10/10/2023).  I spent 30 minutes caring for this patient today, including  preparing to see the patient, obtaining a medical history , reviewing a separately obtained history, performing a medically appropriate examination and/or evaluation, counseling and educating the patient/family/caregiver, documenting clinical information in the electronic health record, and independently interpreting results (not separately reported/billed) and communicating results to the patient/family/caregiver  Raechel Chute, MD [facility] 04/10/2023 10:12 AM    End of visit medications:  No orders of the defined types were placed in this encounter.    Current Outpatient Medications:    allopurinol (ZYLOPRIM) 100 MG tablet, TAKE ONE TABLET BY MOUTH ONCE A DAY. TAKE WITH 300 MG TABLETS, Disp: 90 tablet, Rfl: 1   allopurinol (ZYLOPRIM) 300 MG tablet, TAKE ONE TABLET BY MOUTH DAILY WITH 100 MG TABLET. FOR A TOTAL OF 400 MG DAILY FOR GOUT, Disp: 90 tablet, Rfl: 1   amLODipine (NORVASC) 10 MG tablet, TAKE ONE TABLET BY MOUTH DAILY, Disp: 90 tablet, Rfl: 3   Baclofen 5 MG TABS, Take 1-2 tablets (5-10 mg total) by mouth 3 (three) times daily as needed (msk pain or spasms). (Patient taking differently: Take 10 mg by mouth 3 (three) times daily as needed (msk pain or spasms).), Disp: 90 tablet, Rfl: 3   carvedilol (COREG) 6.25 MG tablet, Take 1 tablet (6.25 mg total) by mouth 2 (two) times daily., Disp: 180 tablet, Rfl: 3   DULoxetine (CYMBALTA) 30 MG capsule, TAKE ONE CAPSULE BY MOUTH TWO TIMES DAILY (Patient taking differently: Take 30 mg by mouth daily as needed.), Disp: 180 capsule, Rfl: 1   ezetimibe (ZETIA) 10 MG tablet, TAKE ONE TABLET BY MOUTH DAILY, Disp: 90 tablet, Rfl: 3   famotidine (PEPCID) 20 MG tablet, Take 1 tablet (20 mg total) by  mouth 2 (two) times daily., Disp: 60 tablet, Rfl: 0   fluticasone (FLONASE) 50 MCG/ACT nasal spray, Place 1 spray into both nostrils daily. (Patient taking differently: Place 1 spray into both nostrils daily as needed for rhinitis or  allergies.), Disp: 18.2 mL, Rfl: 6   insulin glargine (LANTUS SOLOSTAR) 100 UNIT/ML Solostar Pen, Inject 20 Units into the skin at bedtime., Disp: , Rfl:    insulin lispro (HUMALOG) 100 UNIT/ML KwikPen, Inject 1-45 Units into the skin 3 (three) times daily. Sliding scale up to 45 units per injection, Disp: , Rfl:    ipratropium-albuterol (DUONEB) 0.5-2.5 (3) MG/3ML SOLN, Take 3 mLs by nebulization every 6 (six) hours as needed (SOB wheeze cough)., Disp: 180 mL, Rfl: 1   JARDIANCE 10 MG TABS tablet, TAKE ONE TABLET BY MOUTH DAILY BEFORE BREAKFAST, Disp: 30 tablet, Rfl: 3   levocetirizine (XYZAL) 5 MG tablet, TAKE ONE TABLET BY MOUTH EVERY EVENING, Disp: 90 tablet, Rfl: 1   lidocaine (LIDODERM) 5 %, Place 1 patch onto the skin every 12 (twelve) hours. Remove & Discard patch within 12 hours or as directed by MD, Disp: 10 patch, Rfl: 0   lisinopril (ZESTRIL) 40 MG tablet, Take 1 tablet (40 mg total) by mouth daily., Disp: 90 tablet, Rfl: 3   metFORMIN (GLUCOPHAGE) 1000 MG tablet, TAKE ONE TABLET BY MOUTH TWO TIMES A DAY6 WITHA MEAL (Patient taking differently: Take 1,000 mg by mouth at bedtime.), Disp: 180 tablet, Rfl: 3   nicotine polacrilex (GNP NICOTINE POLACRILEX) 2 MG lozenge, TAKE ONE LOZENGE (2MG  TOTAL) BY MOUTH EVERY TWO HOURS AS NEEDED FOR SMOKING CESSATION (Patient taking differently: Take 2 mg by mouth as needed for smoking cessation.), Disp: 27 lozenge, Rfl: 6   ondansetron (ZOFRAN-ODT) 4 MG disintegrating tablet, Take 1-2 tablets (4-8 mg total) by mouth every 8 (eight) hours as needed for nausea or vomiting., Disp: 20 tablet, Rfl: 1   pantoprazole (PROTONIX) 40 MG tablet, Take 1 tablet (40 mg total) by mouth 2 (two) times daily., Disp: 60 tablet, Rfl: 0   potassium chloride SA (KLOR-CON M) 20 MEQ tablet, Take 1 tablet (20 mEq total) by mouth 2 (two) times daily. Take with furosemide, Disp: 60 tablet, Rfl: 5   pregabalin (LYRICA) 50 MG capsule, Take 1 capsule (50 mg total) by mouth 2 (two) times  daily., Disp: 60 capsule, Rfl: 2   rosuvastatin (CRESTOR) 40 MG tablet, TAKE ONE TABLET BY MOUTH DAILY, Disp: 90 tablet, Rfl: 3   Tiotropium Bromide Monohydrate (SPIRIVA RESPIMAT) 2.5 MCG/ACT AERS, Inhale 2 puffs into the lungs daily., Disp: 30 each, Rfl: 11   torsemide (DEMADEX) 20 MG tablet, Take 2 tablets (40 mg total) by mouth daily. (Patient taking differently: Take 20 mg by mouth daily.), Disp: 60 tablet, Rfl: 5   TRULICITY 3 MG/0.5ML SOPN, Inject 3 mg into the skin daily., Disp: , Rfl:    VENTOLIN HFA 108 (90 Base) MCG/ACT inhaler, INHALE 2 PUFFS INTO THE LUNGS EVERY 6 HOURS AS NEEDED FOR WHEEZING OR SHORTNESS OF BREATH, Disp: 18 g, Rfl: 2 No current facility-administered medications for this visit.  Facility-Administered Medications Ordered in Other Visits:    albuterol (PROVENTIL) (2.5 MG/3ML) 0.083% nebulizer solution 2.5 mg, 2.5 mg, Nebulization, Once, Raechel Chute, MD   Subjective:   PATIENT ID: Cory Rivas GENDER: male DOB: 1965-12-25, MRN: [id number]  Chief Complaint  Patient presents with   Follow-up    Not wearing cpap due to sickness and vacation. SOB with exertion, prod cough with clear  sputum and wheezing.     HPI  Mr. Fetterman is a pleasant 57 year old male patient presenting to clinic for follow up.   Symptoms have been stable. Shortness of breath has improved, as has cough. He has more energy and less nocturnal symptoms when using the CPAP. Reviewed compliance report (used 46% of days, 43% of days<4 hours, only 2 days > 4 hours). Does report CPAP helps with breathing, wheeze, and shortness of breath. Compliant with inhalers.  During prior presentations to the ED, he was worked up and ruled out for pulmonary embolism and was told he has bronchitis. He was given prednisone in June and was recently started on Symbicort by his primary care provider.   He continues to abstain from cigarettes, and is here today to discuss his sleep study. He is using the nicotine  lozenges that have helped with his smoking cessation. He is using the Symbicort and Spiriva which are helping with his symptoms.   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 He had to undergo cervical fusion at [facility] after which he has been on disability.  Ancillary information including prior medications, full medical/surgical/family/social histories, and PFTs (when available) are listed below and have been reviewed.   Review of Systems  Constitutional:  Negative for weight loss.  Respiratory:  Negative for cough, hemoptysis, sputum production, shortness of breath and wheezing.   Cardiovascular:  Negative for chest pain.     Objective:   Vitals:   04/10/23 0949  BP: (!) 144/90  Pulse: 60  Temp: 98 F (36.7 C)  TempSrc: Temporal  SpO2: 97%  Weight: 228 lb 12.8 oz (103.8 kg)  Height: 5\' 6"  (1.676 m)   97% on RA BMI Readings from Last 3 Encounters:  04/10/23 36.93 kg/m  03/25/23 35.08 kg/m  03/22/23 35.22 kg/m   Wt Readings from Last 3 Encounters:  04/10/23 228 lb 12.8 oz (103.8 kg)  03/25/23 224 lb (101.6 kg)  03/22/23 224 lb 13.9 oz (102 kg)    Physical Exam Constitutional:      Appearance: He is obese. He is not ill-appearing.  HENT:     Nose: No congestion or rhinorrhea.     Mouth/Throat:     Mouth: Mucous membranes are moist.  Eyes:     Pupils: Pupils are equal, round, and reactive to light.  Cardiovascular:     Rate and Rhythm: Normal rate and regular rhythm.     Heart sounds: Normal heart sounds.  Pulmonary:     Effort: Pulmonary effort is normal.     Breath sounds: Normal breath sounds. No wheezing, rhonchi or rales.  Abdominal:     General: There is distension.     Palpations: Abdomen is soft.  Musculoskeletal:        General: Normal range of motion.  Neurological:     General: No focal deficit present.     Mental Status: He is alert and oriented to person, place, and time. Mental status is at baseline.        Ancillary Information    Past Medical History:  Diagnosis Date   Bulging of cervical intervertebral disc    CHF (congestive heart failure) (HCC)    Diabetes mellitus without complication (HCC)    Gout    High cholesterol    Hypertension    Stress due to family tension 04/15/2019     Family History  Problem Relation Age of Onset   Heart disease Mother    Diabetes  Mother    Hyperlipidemia Mother    Hypertension Mother    Heart attack Mother    Lung cancer Father    Hypertension Father    Stroke Father    AAA (abdominal aortic aneurysm) Maternal Grandmother    Heart attack Maternal Grandmother    Diabetes Maternal Grandfather      Past Surgical History:  Procedure Laterality Date   CHOLECYSTECTOMY     COLONOSCOPY WITH PROPOFOL N/A 03/10/2020   Procedure: COLONOSCOPY WITH PROPOFOL;  Surgeon: Midge Minium, MD;  Location: [facility] ENDOSCOPY;  Service: Endoscopy;  Laterality: N/A;    Social History   Socioeconomic History   Marital status: Divorced    Spouse name: Not on file   Number of children: 0   Years of education: 9   Highest education level: 9th grade  Occupational History   Occupation: disability  Tobacco Use   Smoking status: Some Days    Packs/day: 1.00    Years: 37.00    Additional pack years: 0.00    Total pack years: 37.00    Types: Cigarettes    Start date: 10/19/1989    Last attempt to quit: 08/20/2022    Years since quitting: 0.6   Smokeless tobacco: Never   Tobacco comments:    30+ years hx smoking 2 ppd, stopped smoking briefly in April 2022 for a surgery and then restarted smoking about 1/2 ppd last 4-5 months         5-6cigs daily- 04/10/2023  Vaping Use   Vaping Use: Never used  Substance and Sexual Activity   Alcohol use: No   Drug use: No   Sexual activity: Not Currently    Partners: Female  Other Topics Concern   Not on file  Social History Narrative   Lives with sister [name], brother in law [name]), and his mother - Geron Mulford   Social Determinants of Health   Financial Resource Strain: Medium Risk (12/06/2022)   Overall Financial Resource Strain (CARDIA)    Difficulty of Paying Living Expenses: Somewhat hard  Food Insecurity: Food Insecurity Present (12/06/2022)   Hunger Vital Sign    Worried About Running Out of Food in the Last Year: Often true    Ran Out of Food in the Last Year: Often true  Transportation Needs: No Transportation Needs (12/06/2022)   PRAPARE - Administrator, Civil Service (Medical): No    Lack of Transportation (Non-Medical): No  Physical Activity: Insufficiently Active (12/06/2022)   Exercise Vital Sign    Days of Exercise per Week: 3 days    Minutes of Exercise per Session: 10 min  Stress: No Stress Concern Present (12/06/2022)   [employer] of Occupational Health - Occupational Stress Questionnaire    Feeling of Stress : Only a little  Social Connections: Socially Isolated (12/06/2022)   Social Connection and Isolation Panel [NHANES]    Frequency of Communication with Friends and Family: More than three times a week    Frequency of Social Gatherings with Friends and Family: Twice a week    Attends Religious Services: Never    Database administrator or Organizations: No    Attends Banker Meetings: Never    Marital Status: Divorced  Catering manager Violence: Not At Risk (12/06/2022)   Humiliation, Afraid, Rape, and Kick questionnaire    Fear of Current or Ex-Partner: No    Emotionally Abused: No    Physically Abused: No    Sexually Abused: No  Allergies  Allergen Reactions   Oxycodone Nausea And Vomiting   Entresto [Sacubitril-Valsartan] Nausea Only and Other (See Comments)    Lightheaded and dizzy    Onion Nausea And Vomiting     CBC    Component Value Date/Time   WBC 12.5 (H) 03/25/2023 1243   RBC 5.60 03/25/2023 1243   HGB 16.2 03/25/2023 1243   HGB 14.1 08/01/2022 1057   HCT 47.2 03/25/2023 1243   HCT 42.1 08/01/2022 1057    PLT 224 03/25/2023 1243   PLT 221 08/01/2022 1057   MCV 84.3 03/25/2023 1243   MCV 85 08/01/2022 1057   MCH 28.9 03/25/2023 1243   MCHC 34.3 03/25/2023 1243   RDW 13.3 03/25/2023 1243   RDW 13.6 08/01/2022 1057   LYMPHSABS 1.8 03/25/2023 1243   LYMPHSABS 2.1 08/01/2022 1057   MONOABS 0.5 03/25/2023 1243   EOSABS 0.1 03/25/2023 1243   EOSABS 0.1 08/01/2022 1057   BASOSABS 0.0 03/25/2023 1243   BASOSABS 0.0 08/01/2022 1057    Pulmonary Functions Testing Results:    Latest Ref Rng & Units 10/05/2022    3:54 PM  PFT Results  FVC-Pre L 2.54   FVC-Predicted Pre % 57   FVC-Post L 3.01   FVC-Predicted Post % 67   Pre FEV1/FVC % % 76   Post FEV1/FCV % % 71   FEV1-Pre L 1.93   FEV1-Predicted Pre % 56   FEV1-Post L 2.15   DLCO uncorrected ml/min/mmHg 21.47   DLCO UNC% % 82   DLVA Predicted % 98   TLC L 5.07   TLC % Predicted % 79   RV % Predicted % 108     Outpatient Medications Prior to Visit  Medication Sig Dispense Refill   allopurinol (ZYLOPRIM) 100 MG tablet TAKE ONE TABLET BY MOUTH ONCE A DAY. TAKE WITH 300 MG TABLETS 90 tablet 1   allopurinol (ZYLOPRIM) 300 MG tablet TAKE ONE TABLET BY MOUTH DAILY WITH 100 MG TABLET. FOR A TOTAL OF 400 MG DAILY FOR GOUT 90 tablet 1   amLODipine (NORVASC) 10 MG tablet TAKE ONE TABLET BY MOUTH DAILY 90 tablet 3   Baclofen 5 MG TABS Take 1-2 tablets (5-10 mg total) by mouth 3 (three) times daily as needed (msk pain or spasms). (Patient taking differently: Take 10 mg by mouth 3 (three) times daily as needed (msk pain or spasms).) 90 tablet 3   carvedilol (COREG) 6.25 MG tablet Take 1 tablet (6.25 mg total) by mouth 2 (two) times daily. 180 tablet 3   DULoxetine (CYMBALTA) 30 MG capsule TAKE ONE CAPSULE BY MOUTH TWO TIMES DAILY (Patient taking differently: Take 30 mg by mouth daily as needed.) 180 capsule 1   ezetimibe (ZETIA) 10 MG tablet TAKE ONE TABLET BY MOUTH DAILY 90 tablet 3   famotidine (PEPCID) 20 MG tablet Take 1 tablet (20 mg total)  by mouth 2 (two) times daily. 60 tablet 0   fluticasone (FLONASE) 50 MCG/ACT nasal spray Place 1 spray into both nostrils daily. (Patient taking differently: Place 1 spray into both nostrils daily as needed for rhinitis or allergies.) 18.2 mL 6   insulin glargine (LANTUS SOLOSTAR) 100 UNIT/ML Solostar Pen Inject 20 Units into the skin at bedtime.     insulin lispro (HUMALOG) 100 UNIT/ML KwikPen Inject 1-45 Units into the skin 3 (three) times daily. Sliding scale up to 45 units per injection     ipratropium-albuterol (DUONEB) 0.5-2.5 (3) MG/3ML SOLN Take 3 mLs by nebulization every 6 (six) hours  as needed (SOB wheeze cough). 180 mL 1   JARDIANCE 10 MG TABS tablet TAKE ONE TABLET BY MOUTH DAILY BEFORE BREAKFAST 30 tablet 3   levocetirizine (XYZAL) 5 MG tablet TAKE ONE TABLET BY MOUTH EVERY EVENING 90 tablet 1   lidocaine (LIDODERM) 5 % Place 1 patch onto the skin every 12 (twelve) hours. Remove & Discard patch within 12 hours or as directed by MD 10 patch 0   lisinopril (ZESTRIL) 40 MG tablet Take 1 tablet (40 mg total) by mouth daily. 90 tablet 3   metFORMIN (GLUCOPHAGE) 1000 MG tablet TAKE ONE TABLET BY MOUTH TWO TIMES A DAY6 WITHA MEAL (Patient taking differently: Take 1,000 mg by mouth at bedtime.) 180 tablet 3   nicotine polacrilex (GNP NICOTINE POLACRILEX) 2 MG lozenge TAKE ONE LOZENGE (2MG  TOTAL) BY MOUTH EVERY TWO HOURS AS NEEDED FOR SMOKING CESSATION (Patient taking differently: Take 2 mg by mouth as needed for smoking cessation.) 27 lozenge 6   ondansetron (ZOFRAN-ODT) 4 MG disintegrating tablet Take 1-2 tablets (4-8 mg total) by mouth every 8 (eight) hours as needed for nausea or vomiting. 20 tablet 1   pantoprazole (PROTONIX) 40 MG tablet Take 1 tablet (40 mg total) by mouth 2 (two) times daily. 60 tablet 0   potassium chloride SA (KLOR-CON M) 20 MEQ tablet Take 1 tablet (20 mEq total) by mouth 2 (two) times daily. Take with furosemide 60 tablet 5   pregabalin (LYRICA) 50 MG capsule Take 1  capsule (50 mg total) by mouth 2 (two) times daily. 60 capsule 2   rosuvastatin (CRESTOR) 40 MG tablet TAKE ONE TABLET BY MOUTH DAILY 90 tablet 3   Tiotropium Bromide Monohydrate (SPIRIVA RESPIMAT) 2.5 MCG/ACT AERS Inhale 2 puffs into the lungs daily. 30 each 11   torsemide (DEMADEX) 20 MG tablet Take 2 tablets (40 mg total) by mouth daily. (Patient taking differently: Take 20 mg by mouth daily.) 60 tablet 5   TRULICITY 3 MG/0.5ML SOPN Inject 3 mg into the skin daily.     VENTOLIN HFA 108 (90 Base) MCG/ACT inhaler INHALE 2 PUFFS INTO THE LUNGS EVERY 6 HOURS AS NEEDED FOR WHEEZING OR SHORTNESS OF BREATH 18 g 2   ondansetron (ZOFRAN-ODT) 4 MG disintegrating tablet Take 1 tablet (4 mg total) by mouth every 8 (eight) hours as needed for nausea or vomiting. 15 tablet 0   Facility-Administered Medications Prior to Visit  Medication Dose Route Frequency Provider Last Rate Last Admin   albuterol (PROVENTIL) (2.5 MG/3ML) 0.083% nebulizer solution 2.5 mg  2.5 mg Nebulization Once Raechel Chute, MD

## 2023-04-12 ENCOUNTER — Encounter: Payer: Self-pay | Admitting: Cardiology

## 2023-04-12 ENCOUNTER — Ambulatory Visit: Payer: Medicaid Other | Attending: Cardiology | Admitting: Cardiology

## 2023-04-12 VITALS — BP 174/98 | HR 66 | Ht 66.0 in | Wt 229.4 lb

## 2023-04-12 DIAGNOSIS — I10 Essential (primary) hypertension: Secondary | ICD-10-CM

## 2023-04-12 DIAGNOSIS — I5032 Chronic diastolic (congestive) heart failure: Secondary | ICD-10-CM

## 2023-04-12 DIAGNOSIS — E782 Mixed hyperlipidemia: Secondary | ICD-10-CM | POA: Diagnosis not present

## 2023-04-12 DIAGNOSIS — F17200 Nicotine dependence, unspecified, uncomplicated: Secondary | ICD-10-CM | POA: Diagnosis not present

## 2023-04-12 MED ORDER — CARVEDILOL 12.5 MG PO TABS: 12.5000 mg | ORAL_TABLET | Freq: Two times a day (BID) | ORAL | 3 refills | Status: DC

## 2023-04-12 NOTE — Patient Instructions (Signed)
Medication Instructions:  Your physician has recommended you make the following change in your medication:   carvedilol (COREG) 12.5 MG tablet - Take 1 tablet (12.5 mg total) by mouth 2 (two) times daily with a meal  *If you need a refill on your cardiac medications before your next appointment, please call your pharmacy*  Lab Work: -None ordered If you have labs (blood work) drawn today and your tests are completely normal, you will receive your results only by: MyChart Message (if you have MyChart) OR A paper copy in the mail If you have any lab test that is abnormal or we need to change your treatment, we will call you to review the results.  Testing/Procedures: -None ordered  Follow-Up: At [facility], you and your health needs are our priority.  As part of our continuing mission to provide you with exceptional heart care, we have created designated Provider Care Teams.  These Care Teams include your primary Cardiologist (physician) and Advanced Practice Providers (APPs -  Physician Assistants and Nurse Practitioners) who all work together to provide you with the care you need, when you need it.  Your next appointment:   3 month(s)  Provider:   You may see Debbe Odea, MD or one of the following Advanced Practice Providers on your designated Care Team:   Nicolasa Ducking, NP Eula Listen, PA-C Cadence Fransico Michael, PA-C Charlsie Quest, NP    Other Instructions -None

## 2023-04-12 NOTE — Progress Notes (Signed)
Cardiology Office Note:    Date:  04/12/2023   ID:  Cory Rivas, DOB Oct 11, 1966, MRN [id number]  PCP:  Danelle Berry, PA-C  [facility] Cardiologist:  Debbe Odea, MD  [facility] Electrophysiologist:  None   Referring MD: Danelle Berry, PA-C   Chief Complaint  Patient presents with   Follow-up    Follow up on medication changes at last visit.  Patient reports blood pressure still elevated despite med changes.  BP office check today 164/96, 174/98    History of Present Illness:    Cory Rivas is a 57 y.o. male with a hx of HFpEF, hypertension, diabetes, hyperlipidemia,smoker x35+ years who presents for follow-up.    Blood pressures are still elevated at home, patient still smokes is working on quitting.  Denies chest pain or shortness of breath.  Denies edema.  Compliant with medications as prescribed.  Overall feels okay, no new concerns at this time.  Prior notes Echo 08/2022 EF 60 to 65%, grade 2 diastolic dysfunction Echo 10/2020 EF 60 to 65%, aortic sclerosis Lexiscan Myoview 09/2020 no significant ischemia, low risk study   Past Medical History:  Diagnosis Date   Bulging of cervical intervertebral disc    CHF (congestive heart failure) (HCC)    Diabetes mellitus without complication (HCC)    Gout    High cholesterol    Hypertension    Stress due to family tension 04/15/2019    Past Surgical History:  Procedure Laterality Date   CHOLECYSTECTOMY     COLONOSCOPY WITH PROPOFOL N/A 03/10/2020   Procedure: COLONOSCOPY WITH PROPOFOL;  Surgeon: Midge Minium, MD;  Location: [facility] ENDOSCOPY;  Service: Endoscopy;  Laterality: N/A;    Current Medications: Current Meds  Medication Sig   allopurinol (ZYLOPRIM) 100 MG tablet TAKE ONE TABLET BY MOUTH ONCE A DAY. TAKE WITH 300 MG TABLETS   allopurinol (ZYLOPRIM) 300 MG tablet TAKE ONE TABLET BY MOUTH DAILY WITH 100 MG TABLET. FOR A TOTAL OF 400 MG DAILY FOR GOUT   amLODipine (NORVASC) 10 MG tablet TAKE ONE  TABLET BY MOUTH DAILY   Baclofen 5 MG TABS Take 1-2 tablets (5-10 mg total) by mouth 3 (three) times daily as needed (msk pain or spasms). (Patient taking differently: Take 10 mg by mouth 3 (three) times daily as needed (msk pain or spasms).)   DULoxetine (CYMBALTA) 30 MG capsule TAKE ONE CAPSULE BY MOUTH TWO TIMES DAILY (Patient taking differently: Take 30 mg by mouth daily as needed.)   ezetimibe (ZETIA) 10 MG tablet TAKE ONE TABLET BY MOUTH DAILY   famotidine (PEPCID) 20 MG tablet Take 1 tablet (20 mg total) by mouth 2 (two) times daily.   fluticasone (FLONASE) 50 MCG/ACT nasal spray Place 1 spray into both nostrils daily. (Patient taking differently: Place 1 spray into both nostrils daily as needed for rhinitis or allergies.)   insulin glargine (LANTUS SOLOSTAR) 100 UNIT/ML Solostar Pen Inject 20 Units into the skin at bedtime.   insulin lispro (HUMALOG) 100 UNIT/ML KwikPen Inject 1-45 Units into the skin 3 (three) times daily. Sliding scale up to 45 units per injection   ipratropium-albuterol (DUONEB) 0.5-2.5 (3) MG/3ML SOLN Take 3 mLs by nebulization every 6 (six) hours as needed (SOB wheeze cough).   JARDIANCE 10 MG TABS tablet TAKE ONE TABLET BY MOUTH DAILY BEFORE BREAKFAST   levocetirizine (XYZAL) 5 MG tablet TAKE ONE TABLET BY MOUTH EVERY EVENING   lidocaine (LIDODERM) 5 % Place 1 patch onto the skin every 12 (twelve) hours.  Remove & Discard patch within 12 hours or as directed by MD   lisinopril (ZESTRIL) 40 MG tablet Take 1 tablet (40 mg total) by mouth daily.   metFORMIN (GLUCOPHAGE) 1000 MG tablet TAKE ONE TABLET BY MOUTH TWO TIMES A DAY6 WITHA MEAL (Patient taking differently: Take 1,000 mg by mouth at bedtime.)   nicotine polacrilex (GNP NICOTINE POLACRILEX) 2 MG lozenge TAKE ONE LOZENGE (2MG  TOTAL) BY MOUTH EVERY TWO HOURS AS NEEDED FOR SMOKING CESSATION (Patient taking differently: Take 2 mg by mouth as needed for smoking cessation.)   ondansetron (ZOFRAN-ODT) 4 MG disintegrating  tablet Take 1-2 tablets (4-8 mg total) by mouth every 8 (eight) hours as needed for nausea or vomiting.   pantoprazole (PROTONIX) 40 MG tablet Take 1 tablet (40 mg total) by mouth 2 (two) times daily.   potassium chloride SA (KLOR-CON M) 20 MEQ tablet Take 1 tablet (20 mEq total) by mouth 2 (two) times daily. Take with furosemide   pregabalin (LYRICA) 50 MG capsule Take 1 capsule (50 mg total) by mouth 2 (two) times daily.   rosuvastatin (CRESTOR) 40 MG tablet TAKE ONE TABLET BY MOUTH DAILY   Tiotropium Bromide Monohydrate (SPIRIVA RESPIMAT) 2.5 MCG/ACT AERS Inhale 2 puffs into the lungs daily.   torsemide (DEMADEX) 20 MG tablet Take 20 mg by mouth daily.   TRULICITY 3 MG/0.5ML SOPN Inject 3 mg into the skin daily.   VENTOLIN HFA 108 (90 Base) MCG/ACT inhaler INHALE 2 PUFFS INTO THE LUNGS EVERY 6 HOURS AS NEEDED FOR WHEEZING OR SHORTNESS OF BREATH   [DISCONTINUED] carvedilol (COREG) 6.25 MG tablet Take 1 tablet (6.25 mg total) by mouth 2 (two) times daily.   [DISCONTINUED] torsemide (DEMADEX) 20 MG tablet Take 2 tablets (40 mg total) by mouth daily. (Patient taking differently: Take 20 mg by mouth daily.)     Allergies:   Oxycodone, Entresto [sacubitril-valsartan], and Onion   Social History   Socioeconomic History   Marital status: Divorced    Spouse name: Not on file   Number of children: 0   Years of education: 9   Highest education level: 9th grade  Occupational History   Occupation: disability  Tobacco Use   Smoking status: Some Days    Packs/day: 1.00    Years: 37.00    Additional pack years: 0.00    Total pack years: 37.00    Types: Cigarettes    Start date: 10/19/1989    Last attempt to quit: 08/20/2022    Years since quitting: 0.6   Smokeless tobacco: Never   Tobacco comments:    30+ years hx smoking 2 ppd, stopped smoking briefly in April 2022 for a surgery and then restarted smoking about 1/2 ppd last 4-5 months         5-6cigs daily- 04/10/2023  Vaping Use   Vaping  Use: Never used  Substance and Sexual Activity   Alcohol use: No   Drug use: No   Sexual activity: Not Currently    Partners: Female  Other Topics Concern   Not on file  Social History Narrative   Lives with sister [name], brother in law [name]), and his mother - Cincere Givan   Social Determinants of Health   Financial Resource Strain: Medium Risk (12/06/2022)   Overall Financial Resource Strain (CARDIA)    Difficulty of Paying Living Expenses: Somewhat hard  Food Insecurity: Food Insecurity Present (12/06/2022)   Hunger Vital Sign    Worried About Running Out of Food in the Last Year: Often  true    Ran Out of Food in the Last Year: Often true  Transportation Needs: No Transportation Needs (12/06/2022)   PRAPARE - Administrator, Civil Service (Medical): No    Lack of Transportation (Non-Medical): No  Physical Activity: Insufficiently Active (12/06/2022)   Exercise Vital Sign    Days of Exercise per Week: 3 days    Minutes of Exercise per Session: 10 min  Stress: No Stress Concern Present (12/06/2022)   [employer] of Occupational Health - Occupational Stress Questionnaire    Feeling of Stress : Only a little  Social Connections: Socially Isolated (12/06/2022)   Social Connection and Isolation Panel [NHANES]    Frequency of Communication with Friends and Family: More than three times a week    Frequency of Social Gatherings with Friends and Family: Twice a week    Attends Religious Services: Never    Database administrator or Organizations: No    Attends Engineer, structural: Never    Marital Status: Divorced     Family History: The patient's family history includes AAA (abdominal aortic aneurysm) in his maternal grandmother; Diabetes in his maternal grandfather and mother; Heart attack in his maternal grandmother and mother; Heart disease in his mother; Hyperlipidemia in his mother; Hypertension in his father and mother; Lung cancer in his father; Stroke in  his father.  ROS:   Please see the history of present illness.     All other systems reviewed and are negative.  EKGs/Labs/Other Studies Reviewed:    The following studies were reviewed today:   EKG:  EKG not ordered today.   Recent Labs: 03/21/2023: B Natriuretic Peptide 53.5 03/25/2023: ALT 22; BUN 18; Creatinine, Ser 1.27; Hemoglobin 16.2; Platelets 224; Potassium 3.9; Sodium 139  Recent Lipid Panel    Component Value Date/Time   CHOL 229 (H) 03/21/2023 1203   TRIG 536 (H) 03/21/2023 1203   HDL 43 03/21/2023 1203   CHOLHDL 5.3 (H) 03/21/2023 1203   VLDL 53 (H) 06/08/2022 1534   LDLCALC  03/21/2023 1203     Comment:     . LDL cholesterol not calculated. Triglyceride levels greater than 400 mg/dL invalidate calculated LDL results. . Reference range: <100 . Desirable range <100 mg/dL for primary prevention;   <70 mg/dL for patients with CHD or diabetic patients  with > or = 2 CHD risk factors. Marland Kitchen LDL-C is now calculated using the Martin-Hopkins  calculation, which is a validated novel method providing  better accuracy than the Friedewald equation in the  estimation of LDL-C.  Horald Pollen et al. Lenox Ahr. 4098;119(14): 2061-2068  (http://education.[employer].com/faq/FAQ164)    LDLDIRECT 130 (H) 03/21/2023 1203     Risk Assessment/Calculations:      Physical Exam:    VS:  BP (!) 174/98 (BP Location: Left Arm, Patient Position: Sitting, Cuff Size: Large)   Pulse 66   Ht 5\' 6"  (1.676 m)   Wt 229 lb 6.4 oz (104.1 kg)   SpO2 98%   BMI 37.03 kg/m     Wt Readings from Last 3 Encounters:  04/12/23 229 lb 6.4 oz (104.1 kg)  04/10/23 228 lb 12.8 oz (103.8 kg)  03/25/23 224 lb (101.6 kg)     GEN:  Well nourished, well developed in no acute distress HEENT: Normal NECK: No JVD; No carotid bruits CARDIAC: RRR, 2/6 systolic murmur loudest at left lower sternal border.  RESPIRATORY:  Clear to auscultation without rales, wheezing or rhonchi  ABDOMEN: Soft,  non-tender, non-distended  MUSCULOSKELETAL:  No edema; No deformity  SKIN: Warm and dry NEUROLOGIC:  Alert and oriented x 3 PSYCHIATRIC:  Normal affect   ASSESSMENT:    1. Chronic diastolic congestive heart failure (HCC)   2. Primary hypertension   3. Mixed hyperlipidemia   4. Smoking    PLAN:    In order of problems listed above:  HFpEF, edema controlled.  Continue torsemide 40 mg daily. Hypertension, BP elevated.  Smoking contributing to elevated BP.  Increase Coreg to 12.5 mg twice daily, continue lisinopril 40 mg daily, hydralazine 100 mg 3 times daily, amlodipine 10 mg daily.   Hyperlipidemia, continue Crestor 40 mg daily.  Smoking, cessation advised.  Follow-up in 8-12 weeks.  Medication Adjustments/Labs and Tests Ordered: Current medicines are reviewed at length with the patient today.  Concerns regarding medicines are outlined above.  No orders of the defined types were placed in this encounter.  Meds ordered this encounter  Medications   carvedilol (COREG) 12.5 MG tablet    Sig: Take 1 tablet (12.5 mg total) by mouth 2 (two) times daily with a meal.    Dispense:  180 tablet    Refill:  3    Patient Instructions  Medication Instructions:  Your physician has recommended you make the following change in your medication:   carvedilol (COREG) 12.5 MG tablet - Take 1 tablet (12.5 mg total) by mouth 2 (two) times daily with a meal  *If you need a refill on your cardiac medications before your next appointment, please call your pharmacy*  Lab Work: -None ordered If you have labs (blood work) drawn today and your tests are completely normal, you will receive your results only by: MyChart Message (if you have MyChart) OR A paper copy in the mail If you have any lab test that is abnormal or we need to change your treatment, we will call you to review the results.  Testing/Procedures: -None ordered  Follow-Up: At [facility], you and your health needs are  our priority.  As part of our continuing mission to provide you with exceptional heart care, we have created designated Provider Care Teams.  These Care Teams include your primary Cardiologist (physician) and Advanced Practice Providers (APPs -  Physician Assistants and Nurse Practitioners) who all work together to provide you with the care you need, when you need it.  Your next appointment:   3 month(s)  Provider:   You may see Debbe Odea, MD or one of the following Advanced Practice Providers on your designated Care Team:   Nicolasa Ducking, NP Eula Listen, PA-C Cadence Fransico Michael, PA-C Charlsie Quest, NP    Other Instructions -None    Signed, Debbe Odea, MD  04/12/2023 4:44 PM    [facility]

## 2023-04-22 ENCOUNTER — Encounter: Payer: Self-pay | Admitting: Family

## 2023-04-22 ENCOUNTER — Ambulatory Visit: Payer: Medicaid Other | Attending: Family | Admitting: Family

## 2023-04-22 VITALS — BP 160/70 | HR 62 | Ht 67.0 in | Wt 229.0 lb

## 2023-04-22 DIAGNOSIS — I509 Heart failure, unspecified: Secondary | ICD-10-CM | POA: Diagnosis not present

## 2023-04-22 DIAGNOSIS — I10 Essential (primary) hypertension: Secondary | ICD-10-CM | POA: Diagnosis not present

## 2023-04-22 DIAGNOSIS — E119 Type 2 diabetes mellitus without complications: Secondary | ICD-10-CM | POA: Diagnosis not present

## 2023-04-22 DIAGNOSIS — E1165 Type 2 diabetes mellitus with hyperglycemia: Secondary | ICD-10-CM | POA: Insufficient documentation

## 2023-04-22 DIAGNOSIS — I110 Hypertensive heart disease with heart failure: Secondary | ICD-10-CM | POA: Diagnosis not present

## 2023-04-22 DIAGNOSIS — E7800 Pure hypercholesterolemia, unspecified: Secondary | ICD-10-CM | POA: Diagnosis not present

## 2023-04-22 DIAGNOSIS — Z79899 Other long term (current) drug therapy: Secondary | ICD-10-CM | POA: Insufficient documentation

## 2023-04-22 DIAGNOSIS — E782 Mixed hyperlipidemia: Secondary | ICD-10-CM | POA: Diagnosis not present

## 2023-04-22 DIAGNOSIS — J45909 Unspecified asthma, uncomplicated: Secondary | ICD-10-CM | POA: Diagnosis not present

## 2023-04-22 DIAGNOSIS — Z7985 Long-term (current) use of injectable non-insulin antidiabetic drugs: Secondary | ICD-10-CM | POA: Diagnosis not present

## 2023-04-22 DIAGNOSIS — I5032 Chronic diastolic (congestive) heart failure: Secondary | ICD-10-CM

## 2023-04-22 DIAGNOSIS — I428 Other cardiomyopathies: Secondary | ICD-10-CM | POA: Insufficient documentation

## 2023-04-22 DIAGNOSIS — F17210 Nicotine dependence, cigarettes, uncomplicated: Secondary | ICD-10-CM | POA: Insufficient documentation

## 2023-04-22 DIAGNOSIS — Z7984 Long term (current) use of oral hypoglycemic drugs: Secondary | ICD-10-CM | POA: Diagnosis not present

## 2023-04-22 DIAGNOSIS — Z794 Long term (current) use of insulin: Secondary | ICD-10-CM | POA: Insufficient documentation

## 2023-04-22 DIAGNOSIS — Z5941 Food insecurity: Secondary | ICD-10-CM | POA: Diagnosis not present

## 2023-04-22 DIAGNOSIS — Z8249 Family history of ischemic heart disease and other diseases of the circulatory system: Secondary | ICD-10-CM | POA: Diagnosis not present

## 2023-04-22 DIAGNOSIS — Z833 Family history of diabetes mellitus: Secondary | ICD-10-CM | POA: Diagnosis not present

## 2023-04-22 DIAGNOSIS — Z5986 Financial insecurity: Secondary | ICD-10-CM | POA: Insufficient documentation

## 2023-04-22 MED ORDER — TORSEMIDE 20 MG PO TABS: 20.0000 mg | ORAL_TABLET | ORAL | 0 refills | Status: DC | PRN

## 2023-04-22 NOTE — Progress Notes (Signed)
REDS VEST READING= 38%  CHEST RULER=38.5 HEIGHT MARKER=D

## 2023-04-22 NOTE — Patient Instructions (Signed)
Take an extra torsemide for 2 days.

## 2023-04-22 NOTE — Progress Notes (Addendum)
PCP: Cory Berry, PA-C (last seen 06/24) Primary Cardiologist: Cory Odea, MD (last seen 06/24)  HPI:   Cory Rivas is a 57 y/o male with a history of DM, hyperlipidemia, HTN, gout, tobacco use and chronic heart failure.   Echo 08/23/22: EF of 60-65% along with mild LVH & mild Cory.   Was in the ED 03/23/23 due to hyperglycemia with glucose of >400. Was in the ED 03/22/23 due to nonspecific chest pain. Workup negative. Was in the ED 03/09/23 due to atypical chest pain. Chest CTA negative for PE. Was in the ED 03/01/23 due to neck pain after falling off his tractor after he hit a stump. He landed on his right side.Head CT/ cervical spine xrays were negative. Admitted 11/22/22 due to acute on chronic HF. Weaned off of bipap.Admitted 08/23/22 due to dyspnea, productive cough of brown sputum, mild sore throat, fatigue, dizziness and chest soreness. Steroid burst given. Initially given IV lasix with transition to oral diuretics. Lisinopril held due to AKI. Discharged after 2 days.   He presents today for a HF follow-up visit with a chief complaint of minimal SOB with moderate exertion. Chronic in nature. Has associated fatigue, occasional chest tightness, productive cough, neck stiffness, bilateral shoulder pain, intermittent dizziness and difficulty sleeping along with this. Notices occasional pain at the bottom of his sternum and is having to sleep more upright due to a cough. Denies, palpitations, chest pain, pedal edema, abdominal distention or weight gain.   Under increased stress due to his mom dying in a car accident Apr 19, 2023 and his aunt also died April 21, 2023.  Glucose yesterday was 132. Not checking his blood pressure at home as he has to get a blood pressure cuff.   ROS: All systems negative except as listed in HPI, PMH and Problem List.  SH:  Social History   Socioeconomic History   Marital status: Divorced    Spouse name: Not on file   Number of children: 0   Years of education: 9   Highest  education level: 9th grade  Occupational History   Occupation: disability  Tobacco Use   Smoking status: Some Days    Packs/day: 1.00    Years: 37.00    Additional pack years: 0.00    Total pack years: 37.00    Types: Cigarettes    Start date: 10/19/1989    Last attempt to quit: 08/20/2022    Years since quitting: 0.6   Smokeless tobacco: Never   Tobacco comments:    30+ years hx smoking 2 ppd, stopped smoking briefly in April 2022 for a surgery and then restarted smoking about 1/2 ppd last 4-5 months         5-6cigs daily- 04/10/2023  Vaping Use   Vaping Use: Never used  Substance and Sexual Activity   Alcohol use: No   Drug use: No   Sexual activity: Not Currently    Partners: Female  Other Topics Concern   Not on file  Social History Narrative   Lives with sister Cory Rivas, brother in law Cory Rivas), and his mother - Cory Rivas   Social Determinants of Health   Financial Resource Strain: Medium Risk (12/06/2022)   Overall Financial Resource Strain (CARDIA)    Difficulty of Paying Living Expenses: Somewhat hard  Food Insecurity: Food Insecurity Present (12/06/2022)   Hunger Vital Sign    Worried About Running Out of Food in the Last Year: Often true    Ran Out of Food in the Last Year: Often true  Transportation Needs: No Transportation Needs (12/06/2022)   PRAPARE - Administrator, Civil Service (Medical): No    Lack of Transportation (Non-Medical): No  Physical Activity: Insufficiently Active (12/06/2022)   Exercise Vital Sign    Days of Exercise per Week: 3 days    Minutes of Exercise per Session: 10 min  Stress: No Stress Concern Present (12/06/2022)   [employer] of Occupational Health - Occupational Stress Questionnaire    Feeling of Stress : Only a little  Social Connections: Socially Isolated (12/06/2022)   Social Connection and Isolation Panel [NHANES]    Frequency of Communication with Friends and Family: More than three times a week    Frequency of  Social Gatherings with Friends and Family: Twice a week    Attends Religious Services: Never    Database administrator or Organizations: No    Attends Banker Meetings: Never    Marital Status: Divorced  Catering manager Violence: Not At Risk (12/06/2022)   Humiliation, Afraid, Rape, and Kick questionnaire    Fear of Current or Ex-Partner: No    Emotionally Abused: No    Physically Abused: No    Sexually Abused: No    FH:  Family History  Problem Relation Age of Onset   Heart disease Mother    Diabetes Mother    Hyperlipidemia Mother    Hypertension Mother    Heart attack Mother    Lung cancer Father    Hypertension Father    Stroke Father    AAA (abdominal aortic aneurysm) Maternal Grandmother    Heart attack Maternal Grandmother    Diabetes Maternal Grandfather     Past Medical History:  Diagnosis Date   Bulging of cervical intervertebral disc    CHF (congestive heart failure) (HCC)    Diabetes mellitus without complication (HCC)    Gout    High cholesterol    Hypertension    Stress due to family tension 04/15/2019    Current Outpatient Medications  Medication Sig Dispense Refill   allopurinol (ZYLOPRIM) 100 MG tablet TAKE ONE TABLET BY MOUTH ONCE A DAY. TAKE WITH 300 MG TABLETS 90 tablet 1   allopurinol (ZYLOPRIM) 300 MG tablet TAKE ONE TABLET BY MOUTH DAILY WITH 100 MG TABLET. FOR A TOTAL OF 400 MG DAILY FOR GOUT 90 tablet 1   amLODipine (NORVASC) 10 MG tablet TAKE ONE TABLET BY MOUTH DAILY 90 tablet 3   Baclofen 5 MG TABS Take 1-2 tablets (5-10 mg total) by mouth 3 (three) times daily as needed (msk pain or spasms). (Patient taking differently: Take 10 mg by mouth 3 (three) times daily as needed (msk pain or spasms).) 90 tablet 3   carvedilol (COREG) 12.5 MG tablet Take 1 tablet (12.5 mg total) by mouth 2 (two) times daily with a meal. 180 tablet 3   DULoxetine (CYMBALTA) 30 MG capsule TAKE ONE CAPSULE BY MOUTH TWO TIMES DAILY (Patient taking  differently: Take 30 mg by mouth daily as needed.) 180 capsule 1   ezetimibe (ZETIA) 10 MG tablet TAKE ONE TABLET BY MOUTH DAILY 90 tablet 3   famotidine (PEPCID) 20 MG tablet Take 1 tablet (20 mg total) by mouth 2 (two) times daily. 60 tablet 0   fluticasone (FLONASE) 50 MCG/ACT nasal spray Place 1 spray into both nostrils daily. (Patient taking differently: Place 1 spray into both nostrils daily as needed for rhinitis or allergies.) 18.2 mL 6   insulin glargine (LANTUS SOLOSTAR) 100 UNIT/ML Solostar Pen Inject  20 Units into the skin at bedtime.     insulin lispro (HUMALOG) 100 UNIT/ML KwikPen Inject 1-45 Units into the skin 3 (three) times daily. Sliding scale up to 45 units per injection     ipratropium-albuterol (DUONEB) 0.5-2.5 (3) MG/3ML SOLN Take 3 mLs by nebulization every 6 (six) hours as needed (SOB wheeze cough). 180 mL 1   JARDIANCE 10 MG TABS tablet TAKE ONE TABLET BY MOUTH DAILY BEFORE BREAKFAST 30 tablet 3   levocetirizine (XYZAL) 5 MG tablet TAKE ONE TABLET BY MOUTH EVERY EVENING 90 tablet 1   lidocaine (LIDODERM) 5 % Place 1 patch onto the skin every 12 (twelve) hours. Remove & Discard patch within 12 hours or as directed by MD 10 patch 0   lisinopril (ZESTRIL) 40 MG tablet Take 1 tablet (40 mg total) by mouth daily. 90 tablet 3   metFORMIN (GLUCOPHAGE) 1000 MG tablet TAKE ONE TABLET BY MOUTH TWO TIMES A DAY6 WITHA MEAL (Patient taking differently: Take 1,000 mg by mouth at bedtime.) 180 tablet 3   nicotine polacrilex (GNP NICOTINE POLACRILEX) 2 MG lozenge TAKE ONE LOZENGE (2MG  TOTAL) BY MOUTH EVERY TWO HOURS AS NEEDED FOR SMOKING CESSATION (Patient taking differently: Take 2 mg by mouth as needed for smoking cessation.) 27 lozenge 6   ondansetron (ZOFRAN-ODT) 4 MG disintegrating tablet Take 1-2 tablets (4-8 mg total) by mouth every 8 (eight) hours as needed for nausea or vomiting. 20 tablet 1   pantoprazole (PROTONIX) 40 MG tablet Take 1 tablet (40 mg total) by mouth 2 (two) times  daily. 60 tablet 0   potassium chloride SA (KLOR-CON M) 20 MEQ tablet Take 1 tablet (20 mEq total) by mouth 2 (two) times daily. Take with furosemide 60 tablet 5   pregabalin (LYRICA) 50 MG capsule Take 1 capsule (50 mg total) by mouth 2 (two) times daily. 60 capsule 2   rosuvastatin (CRESTOR) 40 MG tablet TAKE ONE TABLET BY MOUTH DAILY 90 tablet 3   Tiotropium Bromide Monohydrate (SPIRIVA RESPIMAT) 2.5 MCG/ACT AERS Inhale 2 puffs into the lungs daily. 30 each 11   torsemide (DEMADEX) 20 MG tablet Take 20 mg by mouth daily.     TRULICITY 3 MG/0.5ML SOPN Inject 3 mg into the skin daily.     VENTOLIN HFA 108 (90 Base) MCG/ACT inhaler INHALE 2 PUFFS INTO THE LUNGS EVERY 6 HOURS AS NEEDED FOR WHEEZING OR SHORTNESS OF BREATH 18 g 2   No current facility-administered medications for this visit.   Facility-Administered Medications Ordered in Other Visits  Medication Dose Route Frequency Provider Last Rate Last Admin   albuterol (PROVENTIL) (2.5 MG/3ML) 0.083% nebulizer solution 2.5 mg  2.5 mg Nebulization Once Raechel Chute, MD       Vitals:   04/22/23 0841  BP: (!) 160/70  Pulse: 62  SpO2: 96%  Weight: 229 lb (103.9 kg)  Height: 5\' 7"  (1.702 m)   Wt Readings from Last 3 Encounters:  04/22/23 229 lb (103.9 kg)  04/12/23 229 lb 6.4 oz (104.1 kg)  04/10/23 228 lb 12.8 oz (103.8 kg)   Lab Results  Component Value Date   CREATININE 1.27 (H) 03/25/2023   CREATININE 1.51 (H) 03/22/2023   CREATININE 1.45 (H) 03/21/2023   PHYSICAL EXAM:  General:  Well appearing. No resp difficulty HEENT: neck stiff when turning head side to side Neck: supple. JVP flat. Carotids 2+ bilaterally; no bruits. No lymphadenopathy or thryomegaly appreciated. Cor: PMI normal. Regular rate & rhythm. No rubs, gallops. 2/6 systolic murmur at  LSB. Lungs: clear Abdomen: soft, nontender, nondistended. No hepatosplenomegaly. No bruits or masses. Good bowel sounds. Extremities: no cyanosis, clubbing, rash, edema Neuro:  alert & oriented x3, cranial nerves grossly intact. Moves all 4 extremities w/o difficulty. Affect pleasant. 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 - ReDs reading today is 38% - will give an extra 20mg  torsemide for 2 days - continue jardiance 10mg  QD - continue torsemide 20mg  daily with extra doses per above - continue potassium to BID - continue lisinopril 40mg  daily  - continue carvedilol 12.5mg  BID - saw cardiology (Agbor-Etang) 06/24 - drinking less fluids and is trying to keep it closer to 60-64 ounces - not adding salt but continues to eat higher sodium foods  - BNP 03/21/23 was 53.5  2: HTN- - BP 160/70 - emotional support given as mom and aunt have both died in <2 weeks - continue amlodipine 10mg  daily - saw PCP Angelica Chessman) 06/24  - BMP 03/25/23 reviewed and showed sodium 139, potassium 3.9, creatinine 1.27 and GFR >60  3: DM- - A1c 03/21/23 was 10.2% - currently on trulicity, metformin and insulin - saw endocrinology 04/24 - home glucose yesterday was 132 - 03/21/23 microalbumin/ creat ratio was 1338  4: Reactive airway disease- - saw pulmonology (Dgayli) 06/24 - using spiriva  - continues to not smoke - wearing CPAP most nights although fell asleep on the couch the last 2 nights so hadn't worn it  5: Hyperlipidemia- - continue ezetimibe 10mg  daily - continue rosuvastatin 40mg  daily - lipid panel 03/21/23 showed LDL 130, triglycerides 536   Return in 1 month, sooner if needed.

## 2023-04-26 ENCOUNTER — Other Ambulatory Visit: Payer: Self-pay | Admitting: Family Medicine

## 2023-04-28 DIAGNOSIS — I10 Essential (primary) hypertension: Secondary | ICD-10-CM | POA: Diagnosis not present

## 2023-04-28 DIAGNOSIS — G4733 Obstructive sleep apnea (adult) (pediatric): Secondary | ICD-10-CM | POA: Diagnosis not present

## 2023-05-02 DIAGNOSIS — N182 Chronic kidney disease, stage 2 (mild): Secondary | ICD-10-CM | POA: Diagnosis not present

## 2023-05-02 DIAGNOSIS — I129 Hypertensive chronic kidney disease with stage 1 through stage 4 chronic kidney disease, or unspecified chronic kidney disease: Secondary | ICD-10-CM | POA: Diagnosis not present

## 2023-05-02 DIAGNOSIS — I428 Other cardiomyopathies: Secondary | ICD-10-CM | POA: Diagnosis not present

## 2023-05-02 DIAGNOSIS — Z6836 Body mass index (BMI) 36.0-36.9, adult: Secondary | ICD-10-CM | POA: Diagnosis not present

## 2023-05-02 DIAGNOSIS — Z7984 Long term (current) use of oral hypoglycemic drugs: Secondary | ICD-10-CM | POA: Diagnosis not present

## 2023-05-02 DIAGNOSIS — E1122 Type 2 diabetes mellitus with diabetic chronic kidney disease: Secondary | ICD-10-CM | POA: Diagnosis not present

## 2023-05-02 DIAGNOSIS — F17210 Nicotine dependence, cigarettes, uncomplicated: Secondary | ICD-10-CM | POA: Diagnosis not present

## 2023-05-02 DIAGNOSIS — I10 Essential (primary) hypertension: Secondary | ICD-10-CM | POA: Diagnosis not present

## 2023-05-02 DIAGNOSIS — Z7985 Long-term (current) use of injectable non-insulin antidiabetic drugs: Secondary | ICD-10-CM | POA: Diagnosis not present

## 2023-05-02 DIAGNOSIS — G4733 Obstructive sleep apnea (adult) (pediatric): Secondary | ICD-10-CM | POA: Diagnosis not present

## 2023-05-02 DIAGNOSIS — Z556 Problems related to health literacy: Secondary | ICD-10-CM | POA: Diagnosis not present

## 2023-05-02 DIAGNOSIS — R748 Abnormal levels of other serum enzymes: Secondary | ICD-10-CM | POA: Diagnosis not present

## 2023-05-02 DIAGNOSIS — Z794 Long term (current) use of insulin: Secondary | ICD-10-CM | POA: Diagnosis not present

## 2023-05-02 DIAGNOSIS — Z5181 Encounter for therapeutic drug level monitoring: Secondary | ICD-10-CM | POA: Diagnosis not present

## 2023-05-02 DIAGNOSIS — E785 Hyperlipidemia, unspecified: Secondary | ICD-10-CM | POA: Diagnosis not present

## 2023-05-02 DIAGNOSIS — E1165 Type 2 diabetes mellitus with hyperglycemia: Secondary | ICD-10-CM | POA: Diagnosis not present

## 2023-05-02 DIAGNOSIS — R6889 Other general symptoms and signs: Secondary | ICD-10-CM | POA: Diagnosis not present

## 2023-05-03 ENCOUNTER — Ambulatory Visit: Payer: Medicaid Other | Admitting: Podiatry

## 2023-05-17 ENCOUNTER — Ambulatory Visit: Payer: Medicaid Other | Admitting: Podiatry

## 2023-05-17 ENCOUNTER — Encounter: Payer: Self-pay | Admitting: Podiatry

## 2023-05-17 DIAGNOSIS — B351 Tinea unguium: Secondary | ICD-10-CM

## 2023-05-17 DIAGNOSIS — M79675 Pain in left toe(s): Secondary | ICD-10-CM | POA: Diagnosis not present

## 2023-05-17 DIAGNOSIS — E0843 Diabetes mellitus due to underlying condition with diabetic autonomic (poly)neuropathy: Secondary | ICD-10-CM

## 2023-05-17 DIAGNOSIS — M79674 Pain in right toe(s): Secondary | ICD-10-CM

## 2023-05-17 NOTE — Progress Notes (Signed)
  Subjective:  Patient ID: Cory Rivas, male    DOB: 03/12/1966,  MRN: [id number]  Cory Rivas presents to clinic today for: preventative diabetic foot care and painful elongated mycotic toenails 1-5 bilaterally which are tender when wearing enclosed shoe gear. Pain is relieved with periodic professional debridement.  Chief Complaint  Patient presents with   Diabetes   Nail Problem    DFC,A1C:10.2,BS:No,Referring Provider Danelle Berry, PA-C,LOV:6/24      PCP is Danelle Berry, PA-C.  Allergies  Allergen Reactions   Oxycodone Nausea And Vomiting   Entresto [Sacubitril-Valsartan] Nausea Only and Other (See Comments)    Lightheaded and dizzy    Onion Nausea And Vomiting    Review of Systems: Negative except as noted in the HPI.  Objective: No changes noted in today's physical examination. There were no vitals filed for this visit.  Cory Rivas is a pleasant 57 y.o. male in NAD. AAO x 3.  Vascular Examination: Capillary refill time <3 seconds b/l LE. Palpable pedal pulses b/l LE. Digital hair present b/l. No pedal edema b/l. Skin temperature gradient WNL b/l. No varicosities b/l. No cyanosis or clubbing noted b/l LE.Marland Kitchen  Dermatological Examination: Pedal skin with normal turgor, texture and tone b/l. No open wounds. No interdigital macerations b/l. Toenails 1-5 b/l thickened, discolored, dystrophic with subungual debris. There is pain on palpation to dorsal aspect of nailplates. Hyperkeratotic lesion(s) submet head 5 b/l.  No erythema, no edema, no drainage, no fluctuance..  Neurological Examination: Protective sensation intact with 10 gram monofilament b/l LE. Vibratory sensation intact b/l LE.   Musculoskeletal Examination: Muscle strength 5/5 to all lower extremity muscle groups bilaterally. No pain, crepitus or joint limitation noted with ROM bilateral LE. No gross bony deformities bilaterally. Patient ambulates independent of any assistive aids.     Latest Ref Rng &  Units 03/21/2023   12:03 PM 12/06/2022    9:54 AM 08/23/2022    9:07 AM  Hemoglobin A1C  Hemoglobin-A1c <5.7 % of total Hgb 10.2  6.9  7.5    Assessment/Plan: 1. Pain due to onychomycosis of toenails of both feet   2. Diabetes mellitus due to underlying condition with diabetic autonomic neuropathy, unspecified whether long term insulin use (HCC)     Patient was evaluated and treated. All patient's and/or POA's questions/concerns addressed on today's visit. Toenails 1-5 debrided in length and girth without incident. Continue soft, supportive shoe gear daily. Report any pedal injuries to medical professional. Call office if there are any questions/concerns. -As a courtesy, callus(es) submet head 5 b/l pared utilizing sterile scalpel blade without complication or incident. Total number pared=2. -Patient/POA to call should there be question/concern in the interim.   Return in about 3 months (around 08/17/2023).  Freddie Breech, DPM

## 2023-05-22 NOTE — Progress Notes (Deleted)
PCP: Danelle Berry, PA-C (last seen 06/24) Primary Cardiologist: Debbe Odea, MD (last seen 06/24)  HPI:   Cory Rivas is a 57 y/o male with a history of DM, hyperlipidemia, HTN, gout, tobacco use and chronic heart failure.   Echo 08/23/22: EF of 60-65% along with mild LVH & mild Cory.   Was in the ED 03/23/23 due to hyperglycemia with glucose of >400. Was in the ED 03/22/23 due to nonspecific chest pain. Workup negative. Was in the ED 03/09/23 due to atypical chest pain. Chest CTA negative for PE. Was in the ED 03/01/23 due to neck pain after falling off his tractor after he hit a stump. He landed on his right side.Head CT/ cervical spine xrays were negative. Admitted 11/22/22 due to acute on chronic HF. Weaned off of bipap.Admitted 08/23/22 due to dyspnea, productive cough of brown sputum, mild sore throat, fatigue, dizziness and chest soreness. Steroid burst given. Initially given IV lasix with transition to oral diuretics. Lisinopril held due to AKI. Discharged after 2 days.   He presents today for a HF follow-up visit with a chief complaint of minimal SOB with moderate exertion. Chronic in nature. Has associated fatigue, occasional chest tightness, productive cough, neck stiffness, bilateral shoulder pain, intermittent dizziness and difficulty sleeping along with this. Notices occasional pain at the bottom of his sternum and is having to sleep more upright due to a cough. Denies, palpitations, chest pain, pedal edema, abdominal distention or weight gain.   Under increased stress due to his mom dying in a car accident 05-12-2023 and his aunt also died 05-14-23.  Glucose yesterday was 132. Not checking his blood pressure at home as he has to get a blood pressure cuff.   ROS: All systems negative except as listed in HPI, PMH and Problem List.  SH:  Social History   Socioeconomic History   Marital status: Divorced    Spouse name: Not on file   Number of children: 0   Years of education: 9   Highest  education level: 9th grade  Occupational History   Occupation: disability  Tobacco Use   Smoking status: Some Days    Current packs/day: 0.00    Average packs/day: 1 pack/day for 37.0 years (37.0 ttl pk-yrs)    Types: Cigarettes    Start date: 10/19/1989    Last attempt to quit: 08/20/2022    Years since quitting: 0.7   Smokeless tobacco: Never   Tobacco comments:    30+ years hx smoking 2 ppd, stopped smoking briefly in April 2022 for a surgery and then restarted smoking about 1/2 ppd last 4-5 months         5-6cigs daily- 04/10/2023  Vaping Use   Vaping status: Never Used  Substance and Sexual Activity   Alcohol use: No   Drug use: No   Sexual activity: Not Currently    Partners: Female  Other Topics Concern   Not on file  Social History Narrative   Lives with sister Cory Rivas, brother in law Cory Rivas), and his mother - Cory Rivas   Social Determinants of Health   Financial Resource Strain: Medium Risk (12/06/2022)   Overall Financial Resource Strain (CARDIA)    Difficulty of Paying Living Expenses: Somewhat hard  Food Insecurity: Food Insecurity Present (12/06/2022)   Hunger Vital Sign    Worried About Running Out of Food in the Last Year: Often true    Ran Out of Food in the Last Year: Often true  Transportation Needs: No Transportation Needs (  12/06/2022)   PRAPARE - Administrator, Civil Service (Medical): No    Lack of Transportation (Non-Medical): No  Physical Activity: Insufficiently Active (12/06/2022)   Exercise Vital Sign    Days of Exercise per Week: 3 days    Minutes of Exercise per Session: 10 min  Stress: No Stress Concern Present (12/06/2022)   [employer] of Occupational Health - Occupational Stress Questionnaire    Feeling of Stress : Only a little  Social Connections: Socially Isolated (12/06/2022)   Social Connection and Isolation Panel [NHANES]    Frequency of Communication with Friends and Family: More than three times a week    Frequency of  Social Gatherings with Friends and Family: Twice a week    Attends Religious Services: Never    Database administrator or Organizations: No    Attends Banker Meetings: Never    Marital Status: Divorced  Catering manager Violence: Not At Risk (12/06/2022)   Humiliation, Afraid, Rape, and Kick questionnaire    Fear of Current or Ex-Partner: No    Emotionally Abused: No    Physically Abused: No    Sexually Abused: No    FH:  Family History  Problem Relation Age of Onset   Heart disease Mother    Diabetes Mother    Hyperlipidemia Mother    Hypertension Mother    Heart attack Mother    Lung cancer Father    Hypertension Father    Stroke Father    AAA (abdominal aortic aneurysm) Maternal Grandmother    Heart attack Maternal Grandmother    Diabetes Maternal Grandfather     Past Medical History:  Diagnosis Date   Bulging of cervical intervertebral disc    CHF (congestive heart failure) (HCC)    Diabetes mellitus without complication (HCC)    Gout    High cholesterol    Hypertension    Stress due to family tension 04/15/2019    Current Outpatient Medications  Medication Sig Dispense Refill   allopurinol (ZYLOPRIM) 300 MG tablet TAKE ONE TABLET BY MOUTH DAILY WITH 100 MG TABLET. FOR A TOTAL OF 400 MG DAILY FOR GOUT 90 tablet 1   amLODipine (NORVASC) 10 MG tablet TAKE ONE TABLET BY MOUTH DAILY 90 tablet 3   carvedilol (COREG) 12.5 MG tablet Take 1 tablet (12.5 mg total) by mouth 2 (two) times daily with a meal. 180 tablet 3   DULoxetine (CYMBALTA) 30 MG capsule TAKE ONE CAPSULE BY MOUTH TWO TIMES DAILY (Patient taking differently: Take 30 mg by mouth daily as needed.) 180 capsule 1   ezetimibe (ZETIA) 10 MG tablet TAKE ONE TABLET BY MOUTH DAILY 90 tablet 3   fluticasone (FLONASE) 50 MCG/ACT nasal spray Place 1 spray into both nostrils daily. (Patient taking differently: Place 1 spray into both nostrils daily as needed for rhinitis or allergies.) 18.2 mL 6   insulin  glargine (LANTUS SOLOSTAR) 100 UNIT/ML Solostar Pen Inject 20 Units into the skin at bedtime.     insulin lispro (HUMALOG) 100 UNIT/ML KwikPen Inject 1-45 Units into the skin 3 (three) times daily. Sliding scale up to 45 units per injection     ipratropium-albuterol (DUONEB) 0.5-2.5 (3) MG/3ML SOLN Take 3 mLs by nebulization every 6 (six) hours as needed (SOB wheeze cough). 180 mL 1   JARDIANCE 10 MG TABS tablet TAKE ONE TABLET BY MOUTH DAILY BEFORE BREAKFAST 30 tablet 3   levocetirizine (XYZAL) 5 MG tablet TAKE ONE TABLET BY MOUTH EVERY EVENING 90  tablet 1   lidocaine (LIDODERM) 5 % Place 1 patch onto the skin every 12 (twelve) hours. Remove & Discard patch within 12 hours or as directed by MD 10 patch 0   lisinopril (ZESTRIL) 40 MG tablet Take 1 tablet (40 mg total) by mouth daily. 90 tablet 3   metFORMIN (GLUCOPHAGE) 1000 MG tablet TAKE ONE TABLET BY MOUTH TWO TIMES A DAY6 WITHA MEAL (Patient taking differently: Take 1,000 mg by mouth at bedtime.) 180 tablet 3   pantoprazole (PROTONIX) 40 MG tablet Take 1 tablet (40 mg total) by mouth 2 (two) times daily. 60 tablet 0   potassium chloride SA (KLOR-CON M) 20 MEQ tablet Take 1 tablet (20 mEq total) by mouth 2 (two) times daily. Take with furosemide 60 tablet 5   pregabalin (LYRICA) 50 MG capsule Take 1 capsule (50 mg total) by mouth 2 (two) times daily. 60 capsule 2   rosuvastatin (CRESTOR) 40 MG tablet TAKE ONE TABLET BY MOUTH DAILY 90 tablet 3   Tiotropium Bromide Monohydrate (SPIRIVA RESPIMAT) 2.5 MCG/ACT AERS Inhale 2 puffs into the lungs daily. 30 each 11   torsemide (DEMADEX) 20 MG tablet Take 20 mg by mouth daily.     torsemide (DEMADEX) 20 MG tablet Take 1 tablet (20 mg total) by mouth as needed. 10 tablet 0   TRULICITY 3 MG/0.5ML SOPN Inject 3 mg into the skin daily.     VENTOLIN HFA 108 (90 Base) MCG/ACT inhaler INHALE TWO PUFFS INTO THE LUNGS EVERY SIX HOURS AS NEEDED FOR WHEEZING OR SHORTNESS OF BREATH 18 each 2   No current  facility-administered medications for this visit.   Facility-Administered Medications Ordered in Other Visits  Medication Dose Route Frequency Provider Last Rate Last Admin   albuterol (PROVENTIL) (2.5 MG/3ML) 0.083% nebulizer solution 2.5 mg  2.5 mg Nebulization Once Raechel Chute, MD       There were no vitals filed for this visit.  Wt Readings from Last 3 Encounters:  04/22/23 229 lb (103.9 kg)  04/12/23 229 lb 6.4 oz (104.1 kg)  04/10/23 228 lb 12.8 oz (103.8 kg)   Lab Results  Component Value Date   CREATININE 1.27 (H) 03/25/2023   CREATININE 1.51 (H) 03/22/2023   CREATININE 1.45 (H) 03/21/2023   PHYSICAL EXAM:  General:  Well appearing. No resp difficulty HEENT: neck stiff when turning head side to side Neck: supple. JVP flat. Carotids 2+ bilaterally; no bruits. No lymphadenopathy or thryomegaly appreciated. Cor: PMI normal. Regular rate & rhythm. No rubs, gallops. 2/6 systolic murmur at LSB. Lungs: clear Abdomen: soft, nontender, nondistended. No hepatosplenomegaly. No bruits or masses. Good bowel sounds. Extremities: no cyanosis, clubbing, rash, edema Neuro: alert & oriented x3, cranial nerves grossly intact. Moves all 4 extremities w/o difficulty. Affect pleasant. 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 - ReDs reading today is 38% - will give an extra 20mg  torsemide for 2 days - continue jardiance 10mg  QD - continue torsemide 20mg  daily with extra doses per above - continue potassium  to BID - continue lisinopril 40mg  daily  - continue  carvedilol 12.5mg  BID - saw cardiology (Agbor-Etang) 06/24 - drinking less fluids and is trying to keep it closer to 60-64 ounces - not adding salt but continues to eat higher sodium foods  - BNP 03/21/23 was 53.5  2: HTN- - BP 160/70 - emotional support given as mom and aunt have both died in <2 weeks - continue amlodipine 10mg  daily - saw PCP Angelica Chessman) 06/24  - BMP 03/25/23 reviewed and showed sodium 139, potassium 3.9, creatinine 1.27 and GFR >60  3: DM- - A1c 03/21/23 was 10.2% - currently on trulicity, metformin and insulin - saw endocrinology 04/24 - home glucose yesterday was 132 - 03/21/23 microalbumin/ creat ratio was 1338  4: Reactive airway disease- - saw pulmonology (Dgayli) 06/24 - using spiriva  - continues to not smoke - wearing CPAP most nights although fell asleep on the couch the last 2 nights so hadn't worn it  5: Hyperlipidemia- - continue ezetimibe 10mg  daily - continue rosuvastatin 40mg  daily - lipid panel 03/21/23 showed LDL 130, triglycerides 536   Return in 1 month, sooner if needed.

## 2023-05-23 ENCOUNTER — Encounter: Payer: Medicaid Other | Admitting: Family

## 2023-05-27 ENCOUNTER — Observation Stay
Admission: EM | Admit: 2023-05-27 | Discharge: 2023-05-30 | Disposition: A | Discharge status: Home / Self Care | Payer: Medicaid Other | Attending: Student in an Organized Health Care Education/Training Program | Admitting: Student in an Organized Health Care Education/Training Program

## 2023-05-27 ENCOUNTER — Emergency Department: Payer: Medicaid Other

## 2023-05-27 ENCOUNTER — Other Ambulatory Visit: Payer: Self-pay

## 2023-05-27 DIAGNOSIS — Z7984 Long term (current) use of oral hypoglycemic drugs: Secondary | ICD-10-CM | POA: Diagnosis not present

## 2023-05-27 DIAGNOSIS — I160 Hypertensive urgency: Secondary | ICD-10-CM | POA: Diagnosis not present

## 2023-05-27 DIAGNOSIS — M109 Gout, unspecified: Secondary | ICD-10-CM | POA: Diagnosis present

## 2023-05-27 DIAGNOSIS — J4489 Other specified chronic obstructive pulmonary disease: Secondary | ICD-10-CM | POA: Diagnosis not present

## 2023-05-27 DIAGNOSIS — I130 Hypertensive heart and chronic kidney disease with heart failure and stage 1 through stage 4 chronic kidney disease, or unspecified chronic kidney disease: Secondary | ICD-10-CM | POA: Diagnosis not present

## 2023-05-27 DIAGNOSIS — Z794 Long term (current) use of insulin: Secondary | ICD-10-CM | POA: Insufficient documentation

## 2023-05-27 DIAGNOSIS — Z7985 Long-term (current) use of injectable non-insulin antidiabetic drugs: Secondary | ICD-10-CM | POA: Insufficient documentation

## 2023-05-27 DIAGNOSIS — E66812 Obesity, class 2: Secondary | ICD-10-CM

## 2023-05-27 DIAGNOSIS — I5033 Acute on chronic diastolic (congestive) heart failure: Secondary | ICD-10-CM | POA: Diagnosis present

## 2023-05-27 DIAGNOSIS — N179 Acute kidney failure, unspecified: Secondary | ICD-10-CM | POA: Diagnosis not present

## 2023-05-27 DIAGNOSIS — E785 Hyperlipidemia, unspecified: Secondary | ICD-10-CM | POA: Diagnosis not present

## 2023-05-27 DIAGNOSIS — N183 Chronic kidney disease, stage 3 unspecified: Secondary | ICD-10-CM | POA: Insufficient documentation

## 2023-05-27 DIAGNOSIS — I200 Unstable angina: Secondary | ICD-10-CM | POA: Diagnosis not present

## 2023-05-27 DIAGNOSIS — J45909 Unspecified asthma, uncomplicated: Secondary | ICD-10-CM | POA: Diagnosis not present

## 2023-05-27 DIAGNOSIS — F17210 Nicotine dependence, cigarettes, uncomplicated: Secondary | ICD-10-CM | POA: Insufficient documentation

## 2023-05-27 DIAGNOSIS — J449 Chronic obstructive pulmonary disease, unspecified: Secondary | ICD-10-CM | POA: Insufficient documentation

## 2023-05-27 DIAGNOSIS — R7989 Other specified abnormal findings of blood chemistry: Secondary | ICD-10-CM

## 2023-05-27 DIAGNOSIS — E1142 Type 2 diabetes mellitus with diabetic polyneuropathy: Secondary | ICD-10-CM | POA: Diagnosis not present

## 2023-05-27 DIAGNOSIS — E1122 Type 2 diabetes mellitus with diabetic chronic kidney disease: Secondary | ICD-10-CM | POA: Diagnosis not present

## 2023-05-27 DIAGNOSIS — R0789 Other chest pain: Secondary | ICD-10-CM | POA: Diagnosis not present

## 2023-05-27 DIAGNOSIS — Z79899 Other long term (current) drug therapy: Secondary | ICD-10-CM | POA: Diagnosis not present

## 2023-05-27 DIAGNOSIS — R778 Other specified abnormalities of plasma proteins: Secondary | ICD-10-CM | POA: Diagnosis not present

## 2023-05-27 DIAGNOSIS — I5032 Chronic diastolic (congestive) heart failure: Secondary | ICD-10-CM | POA: Diagnosis present

## 2023-05-27 DIAGNOSIS — K219 Gastro-esophageal reflux disease without esophagitis: Secondary | ICD-10-CM | POA: Diagnosis present

## 2023-05-27 DIAGNOSIS — R079 Chest pain, unspecified: Principal | ICD-10-CM

## 2023-05-27 DIAGNOSIS — R0781 Pleurodynia: Secondary | ICD-10-CM | POA: Diagnosis present

## 2023-05-27 LAB — TROPONIN I (HIGH SENSITIVITY)
Troponin I (High Sensitivity): 26 ng/L — ABNORMAL HIGH (ref <18)
Troponin I (High Sensitivity): 24 ng/L — ABNORMAL HIGH (ref <18)

## 2023-05-27 LAB — COMPREHENSIVE METABOLIC PANEL
ALT: 17 U/L (ref 0–44)
AST: 15 U/L (ref 15–41)
Albumin: 3.8 g/dL (ref 3.5–5.0)
Alkaline Phosphatase: 69 U/L (ref 38–126)
Anion gap: 11 (ref 5–15)
BUN: 18 mg/dL (ref 6–20)
CO2: 26 mmol/L (ref 22–32)
Calcium: 9 mg/dL (ref 8.9–10.3)
Chloride: 101 mmol/L (ref 98–111)
Creatinine, Ser: 1.6 mg/dL — ABNORMAL HIGH (ref 0.61–1.24)
GFR, Estimated: 50 mL/min — ABNORMAL LOW (ref >60)
Glucose, Bld: 197 mg/dL — ABNORMAL HIGH (ref 70–99)
Potassium: 3.7 mmol/L (ref 3.5–5.1)
Sodium: 138 mmol/L (ref 135–145)
Total Bilirubin: 0.8 mg/dL (ref 0.3–1.2)
Total Protein: 7.5 g/dL (ref 6.5–8.1)

## 2023-05-27 LAB — CBC
HCT: 45.8 % (ref 39.0–52.0)
Hemoglobin: 15.8 g/dL (ref 13.0–17.0)
MCH: 28.7 pg (ref 26.0–34.0)
MCHC: 34.5 g/dL (ref 30.0–36.0)
MCV: 83.3 fL (ref 80.0–100.0)
Platelets: 225 10*3/uL (ref 150–400)
RBC: 5.5 MIL/uL (ref 4.22–5.81)
RDW: 12.6 % (ref 11.5–15.5)
WBC: 8.8 10*3/uL (ref 4.0–10.5)
nRBC: 0 % (ref 0.0–0.2)

## 2023-05-27 LAB — CBG MONITORING, ED: Glucose-Capillary: 195 mg/dL — ABNORMAL HIGH (ref 70–99)

## 2023-05-27 LAB — BRAIN NATRIURETIC PEPTIDE: B Natriuretic Peptide: 199.1 pg/mL — ABNORMAL HIGH (ref 0.0–100.0)

## 2023-05-27 MED ORDER — EMPAGLIFLOZIN 10 MG PO TABS
10.0000 mg | ORAL_TABLET | Freq: Every day | ORAL | Status: DC
  Administered 2023-05-28 – 2023-05-30 (×3): 10 mg via ORAL
  Filled 2023-05-27 (×3): qty 1

## 2023-05-27 MED ORDER — ACETAMINOPHEN 325 MG PO TABS: 650.0000 mg | ORAL_TABLET | Freq: Four times a day (QID) | ORAL | Status: DC | PRN

## 2023-05-27 MED ORDER — ONDANSETRON HCL 4 MG PO TABS: 4.0000 mg | ORAL_TABLET | Freq: Four times a day (QID) | ORAL | Status: DC | PRN

## 2023-05-27 MED ORDER — ROSUVASTATIN CALCIUM 10 MG PO TABS
40.0000 mg | ORAL_TABLET | Freq: Every day | ORAL | Status: DC
  Administered 2023-05-28 – 2023-05-30 (×3): 40 mg via ORAL
  Filled 2023-05-27 (×3): qty 4

## 2023-05-27 MED ORDER — PREGABALIN 50 MG PO CAPS
50.0000 mg | ORAL_CAPSULE | Freq: Two times a day (BID) | ORAL | Status: DC
  Administered 2023-05-27 – 2023-05-30 (×6): 50 mg via ORAL
  Filled 2023-05-27 (×6): qty 1

## 2023-05-27 MED ORDER — LIDOCAINE 5 % EX PTCH: 1.0000 | MEDICATED_PATCH | Freq: Two times a day (BID) | CUTANEOUS | Status: DC

## 2023-05-27 MED ORDER — ALBUTEROL SULFATE (2.5 MG/3ML) 0.083% IN NEBU: 2.5000 mg | INHALATION_SOLUTION | RESPIRATORY_TRACT | Status: DC | PRN

## 2023-05-27 MED ORDER — FLUTICASONE PROPIONATE 50 MCG/ACT NA SUSP: 1.0000 | Freq: Every day | NASAL | Status: DC | PRN

## 2023-05-27 MED ORDER — NITROGLYCERIN 0.4 MG SL SUBL
0.4000 mg | SUBLINGUAL_TABLET | SUBLINGUAL | Status: DC | PRN
  Administered 2023-05-27 (×3): 0.4 mg via SUBLINGUAL
  Filled 2023-05-27 (×3): qty 1

## 2023-05-27 MED ORDER — MAGNESIUM HYDROXIDE 400 MG/5ML PO SUSP: 30.0000 mL | Freq: Every day | ORAL | Status: DC | PRN

## 2023-05-27 MED ORDER — ACETAMINOPHEN 650 MG RE SUPP: 650.0000 mg | Freq: Four times a day (QID) | RECTAL | Status: DC | PRN

## 2023-05-27 MED ORDER — TORSEMIDE 20 MG PO TABS: 20.0000 mg | ORAL_TABLET | Freq: Every day | ORAL | Status: DC

## 2023-05-27 MED ORDER — ENOXAPARIN SODIUM 60 MG/0.6ML IJ SOSY
0.5000 mg/kg | PREFILLED_SYRINGE | INTRAMUSCULAR | Status: DC
  Administered 2023-05-27 – 2023-05-28 (×2): 52.5 mg via SUBCUTANEOUS
  Filled 2023-05-27 (×2): qty 0.6

## 2023-05-27 MED ORDER — DIPHENHYDRAMINE HCL 50 MG/ML IJ SOLN
25.0000 mg | Freq: Once | INTRAMUSCULAR | Status: AC
  Administered 2023-05-27: 25 mg via INTRAVENOUS
  Filled 2023-05-27: qty 1

## 2023-05-27 MED ORDER — PANTOPRAZOLE SODIUM 40 MG PO TBEC
40.0000 mg | DELAYED_RELEASE_TABLET | Freq: Two times a day (BID) | ORAL | Status: DC
  Administered 2023-05-27 – 2023-05-29 (×5): 40 mg via ORAL
  Filled 2023-05-27 (×5): qty 1

## 2023-05-27 MED ORDER — POTASSIUM CHLORIDE CRYS ER 20 MEQ PO TBCR
20.0000 meq | EXTENDED_RELEASE_TABLET | Freq: Two times a day (BID) | ORAL | Status: DC
  Administered 2023-05-27 – 2023-05-30 (×6): 20 meq via ORAL
  Filled 2023-05-27 (×6): qty 1

## 2023-05-27 MED ORDER — ONDANSETRON HCL 4 MG/2ML IJ SOLN: 4.0000 mg | Freq: Four times a day (QID) | INTRAMUSCULAR | Status: DC | PRN

## 2023-05-27 MED ORDER — AMLODIPINE BESYLATE 10 MG PO TABS
10.0000 mg | ORAL_TABLET | Freq: Every day | ORAL | Status: DC
  Administered 2023-05-28 – 2023-05-30 (×3): 10 mg via ORAL
  Filled 2023-05-27 (×3): qty 1

## 2023-05-27 MED ORDER — TRAZODONE HCL 50 MG PO TABS
25.0000 mg | ORAL_TABLET | Freq: Every evening | ORAL | Status: DC | PRN
  Administered 2023-05-29: 25 mg via ORAL
  Filled 2023-05-27: qty 1

## 2023-05-27 MED ORDER — ALLOPURINOL 100 MG PO TABS
300.0000 mg | ORAL_TABLET | Freq: Every day | ORAL | Status: DC
  Administered 2023-05-28 – 2023-05-30 (×3): 300 mg via ORAL
  Filled 2023-05-27 (×3): qty 3

## 2023-05-27 MED ORDER — NITROGLYCERIN IN D5W 200-5 MCG/ML-% IV SOLN: 0.0000 ug/min | INTRAVENOUS | Status: DC

## 2023-05-27 MED ORDER — LISINOPRIL 10 MG PO TABS: 40.0000 mg | ORAL_TABLET | Freq: Every day | ORAL | Status: DC

## 2023-05-27 MED ORDER — INSULIN GLARGINE-YFGN 100 UNIT/ML ~~LOC~~ SOLN
20.0000 [IU] | Freq: Every day | SUBCUTANEOUS | Status: DC
  Administered 2023-05-27 – 2023-05-29 (×3): 20 [IU] via SUBCUTANEOUS
  Filled 2023-05-27 (×4): qty 0.2

## 2023-05-27 MED ORDER — DULOXETINE HCL 30 MG PO CPEP: 30.0000 mg | ORAL_CAPSULE | Freq: Two times a day (BID) | ORAL | Status: DC | PRN

## 2023-05-27 MED ORDER — TORSEMIDE 20 MG PO TABS: 20.0000 mg | ORAL_TABLET | ORAL | Status: DC | PRN

## 2023-05-27 MED ORDER — HYDRALAZINE HCL 20 MG/ML IJ SOLN
10.0000 mg | Freq: Four times a day (QID) | INTRAMUSCULAR | Status: DC | PRN
  Administered 2023-05-27: 10 mg via INTRAVENOUS
  Filled 2023-05-27: qty 1

## 2023-05-27 MED ORDER — EZETIMIBE 10 MG PO TABS
10.0000 mg | ORAL_TABLET | Freq: Every day | ORAL | Status: DC
  Administered 2023-05-28 – 2023-05-30 (×3): 10 mg via ORAL
  Filled 2023-05-27 (×3): qty 1

## 2023-05-27 MED ORDER — TIOTROPIUM BROMIDE MONOHYDRATE 18 MCG IN CAPS
18.0000 ug | ORAL_CAPSULE | Freq: Every day | RESPIRATORY_TRACT | Status: DC
  Administered 2023-05-28 – 2023-05-30 (×3): 18 ug via RESPIRATORY_TRACT
  Filled 2023-05-27: qty 5

## 2023-05-27 MED ORDER — IPRATROPIUM-ALBUTEROL 0.5-2.5 (3) MG/3ML IN SOLN: 3.0000 mL | Freq: Four times a day (QID) | RESPIRATORY_TRACT | Status: DC | PRN

## 2023-05-27 MED ORDER — CARVEDILOL 12.5 MG PO TABS
12.5000 mg | ORAL_TABLET | Freq: Two times a day (BID) | ORAL | Status: DC
  Administered 2023-05-28 – 2023-05-29 (×3): 12.5 mg via ORAL
  Filled 2023-05-27 (×3): qty 1

## 2023-05-27 NOTE — ED Notes (Signed)
Patient states pressure in chest is feeling less at this time and itchiness feeling has gone down.

## 2023-05-27 NOTE — Progress Notes (Signed)
Anticoagulation monitoring(Lovenox):  57 yo male ordered Lovenox 40 mg Q24h    Filed Weights   05/27/23 1941  Weight: 104.3 kg (230 lb)   BMI 37.12    Lab Results  Component Value Date   CREATININE 1.60 (H) 05/27/2023   CREATININE 1.27 (H) 03/25/2023   CREATININE 1.51 (H) 03/22/2023   Estimated Creatinine Clearance: 58.3 mL/min (A) (by C-G formula based on SCr of 1.6 mg/dL (H)). Hemoglobin & Hematocrit     Component Value Date/Time   HGB 15.8 05/27/2023 1954   HGB 14.1 08/01/2022 1057   HCT 45.8 05/27/2023 1954   HCT 42.1 08/01/2022 1057     Per Protocol for Patient with estCrcl > 30 ml/min and BMI > 30, will transition to Lovenox 52.5 mg Q24h.

## 2023-05-27 NOTE — Assessment & Plan Note (Addendum)
Blood pressure seems improving but still elevated. -Restarting home lisinopril as renal function with some improvement. -Continue with IV hydralazine as needed

## 2023-05-27 NOTE — Assessment & Plan Note (Signed)
-   We will continue PPI therapy 

## 2023-05-27 NOTE — H&P (Signed)
[place]   PATIENT NAME: Cory Rivas    MR#:  [id number]  DATE OF BIRTH:  [birth year]  DATE OF ADMISSION:  05/27/2023  PRIMARY CARE PHYSICIAN: Danelle Berry, PA-C   Patient is coming from: Home  REQUESTING/REFERRING PHYSICIAN: Donna Bernard, MD  CHIEF COMPLAINT:   Chief Complaint  Patient presents with   Chest Pain   Shortness of Breath    HISTORY OF PRESENT ILLNESS:  Cory Rivas is a 57 y.o. Caucasian male with medical history significant for CHF, type 2 diabetes mellitus, gout, hypertension COPD, asthma and dyslipidemia, who presented to the emergency room with acute onset of chest pain with associated dyspnea and diaphoresis with no radiation.  No fever or chills.  No nausea or vomiting or abdominal pain.  No bleeding diathesis.  No dysuria, oliguria or hematuria or flank pain.  No headache or dizziness or blurred vision.  No paresthesias or focal muscle weakness.  ED Course: When he came to the ER, BP was 204/102 with otherwise normal vital signs.  Later on respiratory rate was 25.  Labs revealed blood glucose 197 and creatinine 1.6 compared to 1.27 on 03/25/2023.  With otherwise unremarkable CMP.  BNP was 199.1.  High-sensitivity troponin I was 26 and later 24.  CBC showed no abnormalities. EKG as reviewed by me : EKG showed normal sinus rhythm with a rate of 62 with T wave inversion laterally. Imaging: Two-view chest x-ray showed no acute cardiopulmonary disease.  The patient was given 25 mg of IV Benadryl and 0.5 sublingual nitroglycerin with initial improvement of BP.  He will be admitted to progressive unit observation bed for further evaluation and management. PAST MEDICAL HISTORY:   Past Medical History:  Diagnosis Date   Bulging of cervical intervertebral disc    CHF (congestive heart failure) (HCC)    Diabetes mellitus without complication (HCC)    Gout    High cholesterol    Hypertension    Stress due to family tension 04/15/2019    PAST SURGICAL  HISTORY:   Past Surgical History:  Procedure Laterality Date   CHOLECYSTECTOMY     COLONOSCOPY WITH PROPOFOL N/A 03/10/2020   Procedure: COLONOSCOPY WITH PROPOFOL;  Surgeon: Midge Minium, MD;  Location: [facility] ENDOSCOPY;  Service: Endoscopy;  Laterality: N/A;    SOCIAL HISTORY:   Social History   Tobacco Use   Smoking status: Some Days    Current packs/day: 0.00    Average packs/day: 1 pack/day for 37.0 years (37.0 ttl pk-yrs)    Types: Cigarettes    Start date: 10/19/1989    Last attempt to quit: 08/20/2022    Years since quitting: 0.7   Smokeless tobacco: Never   Tobacco comments:    30+ years hx smoking 2 ppd, stopped smoking briefly in April 2022 for a surgery and then restarted smoking about 1/2 ppd last 4-5 months         5-6cigs daily- 04/10/2023  Substance Use Topics   Alcohol use: No    FAMILY HISTORY:   Family History  Problem Relation Age of Onset   Heart disease Mother    Diabetes Mother    Hyperlipidemia Mother    Hypertension Mother    Heart attack Mother    Lung cancer Father    Hypertension Father    Stroke Father    AAA (abdominal aortic aneurysm) Maternal Grandmother    Heart attack Maternal Grandmother    Diabetes Maternal Grandfather     DRUG ALLERGIES:  Allergies  Allergen Reactions   Oxycodone Nausea And Vomiting   Entresto [Sacubitril-Valsartan] Nausea Only and Other (See Comments)    Lightheaded and dizzy    Onion Nausea And Vomiting    REVIEW OF SYSTEMS:   ROS As per history of present illness. All pertinent systems were reviewed above. Constitutional, HEENT, cardiovascular, respiratory, GI, GU, musculoskeletal, neuro, psychiatric, endocrine, integumentary and hematologic systems were reviewed and are otherwise negative/unremarkable except for positive findings mentioned above in the HPI.   MEDICATIONS AT HOME:   Prior to Admission medications   Medication Sig Start Date End Date Taking? Authorizing Provider  allopurinol  (ZYLOPRIM) 300 MG tablet TAKE ONE TABLET BY MOUTH DAILY WITH 100 MG TABLET. FOR A TOTAL OF 400 MG DAILY FOR GOUT 03/05/23   Danelle Berry, PA-C  amLODipine (NORVASC) 10 MG tablet TAKE ONE TABLET BY MOUTH DAILY 01/11/23   Danelle Berry, PA-C  carvedilol (COREG) 12.5 MG tablet Take 1 tablet (12.5 mg total) by mouth 2 (two) times daily with a meal. 04/12/23   Agbor-Etang, Arlys John, MD  DULoxetine (CYMBALTA) 30 MG capsule TAKE ONE CAPSULE BY MOUTH TWO TIMES DAILY Patient taking differently: Take 30 mg by mouth daily as needed. 01/11/23   Danelle Berry, PA-C  ezetimibe (ZETIA) 10 MG tablet TAKE ONE TABLET BY MOUTH DAILY 01/11/23   Danelle Berry, PA-C  fluticasone (FLONASE) 50 MCG/ACT nasal spray Place 1 spray into both nostrils daily. Patient taking differently: Place 1 spray into both nostrils daily as needed for rhinitis or allergies. 01/23/23 01/23/24  Danelle Berry, PA-C  insulin glargine (LANTUS SOLOSTAR) 100 UNIT/ML Solostar Pen Inject 20 Units into the skin at bedtime. 11/01/20   [provider]  insulin lispro (HUMALOG) 100 UNIT/ML KwikPen Inject 1-45 Units into the skin 3 (three) times daily. Sliding scale up to 45 units per injection 11/08/20   [provider]  ipratropium-albuterol (DUONEB) 0.5-2.5 (3) MG/3ML SOLN Take 3 mLs by nebulization every 6 (six) hours as needed (SOB wheeze cough). 07/23/22   Danelle Berry, PA-C  JARDIANCE 10 MG TABS tablet TAKE ONE TABLET BY MOUTH DAILY BEFORE BREAKFAST 01/11/23   Delma Freeze, FNP  levocetirizine (XYZAL) 5 MG tablet TAKE ONE TABLET BY MOUTH EVERY EVENING 01/11/23   Danelle Berry, PA-C  lidocaine (LIDODERM) 5 % Place 1 patch onto the skin every 12 (twelve) hours. Remove & Discard patch within 12 hours or as directed by MD 03/01/23 02/29/24  Phineas Semen, MD  lisinopril (ZESTRIL) 40 MG tablet Take 1 tablet (40 mg total) by mouth daily. 02/21/23 05/22/23  Debbe Odea, MD  metFORMIN (GLUCOPHAGE) 1000 MG tablet TAKE ONE TABLET BY MOUTH TWO TIMES A DAY6  WITHA MEAL Patient taking differently: Take 1,000 mg by mouth at bedtime. 01/11/23   Danelle Berry, PA-C  pantoprazole (PROTONIX) 40 MG tablet Take 1 tablet (40 mg total) by mouth 2 (two) times daily. 03/25/23   Danelle Berry, PA-C  potassium chloride SA (KLOR-CON M) 20 MEQ tablet Take 1 tablet (20 mEq total) by mouth 2 (two) times daily. Take with furosemide 01/10/23   Clarisa Kindred A, FNP  pregabalin (LYRICA) 50 MG capsule Take 1 capsule (50 mg total) by mouth 2 (two) times daily. 02/22/23   Danelle Berry, PA-C  rosuvastatin (CRESTOR) 40 MG tablet TAKE ONE TABLET BY MOUTH DAILY 01/11/23   Danelle Berry, PA-C  Tiotropium Bromide Monohydrate (SPIRIVA RESPIMAT) 2.5 MCG/ACT AERS Inhale 2 puffs into the lungs daily. 09/24/22   Raechel Chute, MD  torsemide (DEMADEX) 20 MG  tablet Take 20 mg by mouth daily.    [provider]  torsemide (DEMADEX) 20 MG tablet Take 1 tablet (20 mg total) by mouth as needed. 04/22/23   Delma Freeze, FNP  TRULICITY 3 MG/0.5ML SOPN Inject 3 mg into the skin daily. 03/26/22   [provider]  VENTOLIN HFA 108 (90 Base) MCG/ACT inhaler INHALE TWO PUFFS INTO THE LUNGS EVERY SIX HOURS AS NEEDED FOR WHEEZING OR SHORTNESS OF BREATH 04/26/23   Danelle Berry, PA-C      VITAL SIGNS:  Blood pressure (!) 172/82, pulse (!) 53, temperature 98.5 F (36.9 C), temperature source Oral, resp. rate 19, height 5\' 6"  (1.676 m), weight 104.3 kg, SpO2 96%.  PHYSICAL EXAMINATION:  Physical Exam  GENERAL:  57 y.o.-year-old Caucasian male patient lying in the bed with no acute distress.  EYES: Pupils equal, round, reactive to light and accommodation. No scleral icterus. Extraocular muscles intact.  HEENT: Head atraumatic, normocephalic. Oropharynx and nasopharynx clear.  NECK:  Supple, no jugular venous distention. No thyroid enlargement, no tenderness.  LUNGS: Normal breath sounds bilaterally, no wheezing, rales,rhonchi or crepitation. No use of accessory muscles of respiration.   CARDIOVASCULAR: Regular rate and rhythm, S1, S2 normal. No murmurs, rubs, or gallops.  ABDOMEN: Soft, nondistended, nontender. Bowel sounds present. No organomegaly or mass.  EXTREMITIES: No pedal edema, cyanosis, or clubbing.  NEUROLOGIC: Cranial nerves II through XII are intact. Muscle strength 5/5 in all extremities. Sensation intact. Gait not checked.  PSYCHIATRIC: The patient is alert and oriented x 3.  Normal affect and good eye contact. SKIN: No obvious rash, lesion, or ulcer.   LABORATORY PANEL:   CBC Recent Labs  Lab 05/27/23 1954  WBC 8.8  HGB 15.8  HCT 45.8  PLT 225   ------------------------------------------------------------------------------------------------------------------  Chemistries  Recent Labs  Lab 05/27/23 1954  NA 138  K 3.7  CL 101  CO2 26  GLUCOSE 197*  BUN 18  CREATININE 1.60*  CALCIUM 9.0  AST 15  ALT 17  ALKPHOS 69  BILITOT 0.8   ------------------------------------------------------------------------------------------------------------------  Cardiac Enzymes No results for input(s): "TROPONINI" in the last 168 hours. ------------------------------------------------------------------------------------------------------------------  RADIOLOGY:  DG Chest 2 View  Result Date: 05/27/2023 CLINICAL DATA:  Chest pain EXAM: CHEST - 2 VIEW COMPARISON:  03/21/2023 FINDINGS: Hardware in the cervical spine. No acute airspace disease or effusion. Normal cardiac size. No pneumothorax IMPRESSION: No active cardiopulmonary disease. Electronically Signed   By: Jasmine Pang M.D.   On: 05/27/2023 20:09      IMPRESSION AND PLAN:  Assessment and Plan: * Hypertensive urgency - The patient will be admitted to a progressive unit bed. - We will place him on as needed IV hydralazine - We will continue his antihypertensives while holding off Zestril given AKI.   Chest pain - The patient will be followed with serial troponins. - We will place him on  aspirin as well as as needed IV morphine sulfate for pain. - This could be related to his hypertensive urgency. - Cardiology consult will be obtained. - I notified [facility] group about the patient.  AKI (acute kidney injury) (HCC) - We will hold off nephrotoxins. - Will follow BMPs.  Type 2 diabetes mellitus with peripheral neuropathy (HCC) - He will be placed on supplemental coverage with NovoLog. - We will continue basal coverage. - We will hold off metformin and continue Jardiance. - We will continue Lyrica.  Dyslipidemia - We will continue statin therapy and Zetia.  COPD with asthma -  We will continue his albuterol and DuoNebs while holding off long-acting beta agonist.  GERD without esophagitis - We will continue PPI therapy.  Gout - We will continue allopurinol.   DVT prophylaxis: Lovenox.  Advanced Care Planning:  Code Status: full code.  Family Communication:  The plan of care was discussed in details with the patient (and family). I answered all questions. The patient agreed to proceed with the above mentioned plan. Further management will depend upon hospital course. Disposition Plan: Back to previous home environment Consults called: Cardiology. All the records are reviewed and case discussed with ED provider.  Status is: Observation  I certify that at the time of admission, it is my clinical judgment that the patient will require hospital care extending less than 2 midnights.                            Dispo: The patient is from: Home              Anticipated d/c is to: Home              Patient currently is not medically stable to d/c.              Difficult to place patient: No  Hannah Beat M.D on 05/27/2023 at 10:43 PM  [facility]   From 7 PM-7 AM, contact night-coverage [email or web address]  CC: Primary care physician; Danelle Berry, PA-C

## 2023-05-27 NOTE — ED Triage Notes (Signed)
Pt reports substernal chest pain and SOB with diaphoresis that onset after eating dinner at 1700. Pt reports hx of CHF. Pt describes pain as a pressure and denies radiation to other areas. Pt alert and oriented following commands.breathing noted to be unlabored speaking in full sentences with symmetric chest rise and fall. Pt does report hx of asthma and COPD.

## 2023-05-27 NOTE — Assessment & Plan Note (Addendum)
-   He will be placed on supplemental coverage with NovoLog. - We will continue basal coverage. - We will hold off metformin and continue Jardiance. - We will continue Lyrica.

## 2023-05-27 NOTE — ED Notes (Signed)
Pharmacy at bedside at this time.

## 2023-05-27 NOTE — Assessment & Plan Note (Signed)
-  Continue Zetia and Lipitor 

## 2023-05-27 NOTE — ED Notes (Addendum)
Pt starts complaining of itching on R hand to R arm at this time after receiving 3 doses of nitroglycerin. Call MD and inform him of patient complaints and gives a order for 25mg  of benadryl at this time. Both MD and this RN verified with patient before ordering and administering medication he had no allergic reaction to nitroglycerin when previously taken before. Pt has some redness from itching arm at this time but no other allergic reaction signs or symptoms.

## 2023-05-27 NOTE — Progress Notes (Deleted)
PCP: Danelle Berry, PA-C (last seen 06/24) Primary Cardiologist: Debbe Odea, MD (last seen 06/24)  HPI:   Mr Verbeke is a 57 y/o male with a history of DM, hyperlipidemia, HTN, gout, tobacco use and chronic heart failure.   Was in the ED 03/23/23 due to hyperglycemia with glucose of >400. Was in the ED 03/22/23 due to nonspecific chest pain. Workup negative. Was in the ED 03/09/23 due to atypical chest pain. Chest CTA negative for PE. Was in the ED 03/01/23 due to neck pain after falling off his tractor after he hit a stump. He landed on his right side.Head CT/ cervical spine xrays were negative. Admitted 11/22/22 due to acute on chronic HF. Weaned off of bipap.Admitted 08/23/22 due to dyspnea, productive cough of brown sputum, mild sore throat, fatigue, dizziness and chest soreness. Steroid burst given. Initially given IV lasix with transition to oral diuretics. Lisinopril held due to AKI. Discharged after 2 days.   Echo 08/23/22: EF of 60-65% along with mild LVH & mild MR.   He presents today for a HF follow-up visit with a chief complaint of     Under increased stress due to his mom dying in a car accident 05/07/23 and his aunt also died 2023/05/09.    ROS: All systems negative except as listed in HPI, PMH and Problem List.  SH:  Social History   Socioeconomic History   Marital status: Divorced    Spouse name: Not on file   Number of children: 0   Years of education: 9   Highest education level: 9th grade  Occupational History   Occupation: disability  Tobacco Use   Smoking status: Some Days    Current packs/day: 0.00    Average packs/day: 1 pack/day for 37.0 years (37.0 ttl pk-yrs)    Types: Cigarettes    Start date: 10/19/1989    Last attempt to quit: 08/20/2022    Years since quitting: 0.7   Smokeless tobacco: Never   Tobacco comments:    30+ years hx smoking 2 ppd, stopped smoking briefly in April 2022 for a surgery and then restarted smoking about 1/2 ppd last 4-5 months          5-6cigs daily- 04/10/2023  Vaping Use   Vaping status: Never Used  Substance and Sexual Activity   Alcohol use: No   Drug use: No   Sexual activity: Not Currently    Partners: Female  Other Topics Concern   Not on file  Social History Narrative   Lives with sister [name], brother in law [name]), and his mother - Saburo Teaff   Social Determinants of Health   Financial Resource Strain: Medium Risk (12/06/2022)   Overall Financial Resource Strain (CARDIA)    Difficulty of Paying Living Expenses: Somewhat hard  Food Insecurity: Food Insecurity Present (12/06/2022)   Hunger Vital Sign    Worried About Running Out of Food in the Last Year: Often true    Ran Out of Food in the Last Year: Often true  Transportation Needs: No Transportation Needs (12/06/2022)   PRAPARE - Administrator, Civil Service (Medical): No    Lack of Transportation (Non-Medical): No  Physical Activity: Insufficiently Active (12/06/2022)   Exercise Vital Sign    Days of Exercise per Week: 3 days    Minutes of Exercise per Session: 10 min  Stress: No Stress Concern Present (12/06/2022)   [employer] of Occupational Health - Occupational Stress Questionnaire    Feeling of Stress :  Only a little  Social Connections: Socially Isolated (12/06/2022)   Social Connection and Isolation Panel [NHANES]    Frequency of Communication with Friends and Family: More than three times a week    Frequency of Social Gatherings with Friends and Family: Twice a week    Attends Religious Services: Never    Database administrator or Organizations: No    Attends Banker Meetings: Never    Marital Status: Divorced  Catering manager Violence: Not At Risk (12/06/2022)   Humiliation, Afraid, Rape, and Kick questionnaire    Fear of Current or Ex-Partner: No    Emotionally Abused: No    Physically Abused: No    Sexually Abused: No    FH:  Family History  Problem Relation Age of Onset   Heart disease Mother     Diabetes Mother    Hyperlipidemia Mother    Hypertension Mother    Heart attack Mother    Lung cancer Father    Hypertension Father    Stroke Father    AAA (abdominal aortic aneurysm) Maternal Grandmother    Heart attack Maternal Grandmother    Diabetes Maternal Grandfather     Past Medical History:  Diagnosis Date   Bulging of cervical intervertebral disc    CHF (congestive heart failure) (HCC)    Diabetes mellitus without complication (HCC)    Gout    High cholesterol    Hypertension    Stress due to family tension 04/15/2019    Current Outpatient Medications  Medication Sig Dispense Refill   allopurinol (ZYLOPRIM) 300 MG tablet TAKE ONE TABLET BY MOUTH DAILY WITH 100 MG TABLET. FOR A TOTAL OF 400 MG DAILY FOR GOUT 90 tablet 1   amLODipine (NORVASC) 10 MG tablet TAKE ONE TABLET BY MOUTH DAILY 90 tablet 3   carvedilol (COREG) 12.5 MG tablet Take 1 tablet (12.5 mg total) by mouth 2 (two) times daily with a meal. 180 tablet 3   DULoxetine (CYMBALTA) 30 MG capsule TAKE ONE CAPSULE BY MOUTH TWO TIMES DAILY (Patient taking differently: Take 30 mg by mouth daily as needed.) 180 capsule 1   ezetimibe (ZETIA) 10 MG tablet TAKE ONE TABLET BY MOUTH DAILY 90 tablet 3   fluticasone (FLONASE) 50 MCG/ACT nasal spray Place 1 spray into both nostrils daily. (Patient taking differently: Place 1 spray into both nostrils daily as needed for rhinitis or allergies.) 18.2 mL 6   insulin glargine (LANTUS SOLOSTAR) 100 UNIT/ML Solostar Pen Inject 20 Units into the skin at bedtime.     insulin lispro (HUMALOG) 100 UNIT/ML KwikPen Inject 1-45 Units into the skin 3 (three) times daily. Sliding scale up to 45 units per injection     ipratropium-albuterol (DUONEB) 0.5-2.5 (3) MG/3ML SOLN Take 3 mLs by nebulization every 6 (six) hours as needed (SOB wheeze cough). 180 mL 1   JARDIANCE 10 MG TABS tablet TAKE ONE TABLET BY MOUTH DAILY BEFORE BREAKFAST 30 tablet 3   levocetirizine (XYZAL) 5 MG tablet TAKE ONE  TABLET BY MOUTH EVERY EVENING 90 tablet 1   lidocaine (LIDODERM) 5 % Place 1 patch onto the skin every 12 (twelve) hours. Remove & Discard patch within 12 hours or as directed by MD 10 patch 0   lisinopril (ZESTRIL) 40 MG tablet Take 1 tablet (40 mg total) by mouth daily. 90 tablet 3   metFORMIN (GLUCOPHAGE) 1000 MG tablet TAKE ONE TABLET BY MOUTH TWO TIMES A DAY6 WITHA MEAL (Patient taking differently: Take 1,000 mg by mouth  at bedtime.) 180 tablet 3   pantoprazole (PROTONIX) 40 MG tablet Take 1 tablet (40 mg total) by mouth 2 (two) times daily. 60 tablet 0   potassium chloride SA (KLOR-CON M) 20 MEQ tablet Take 1 tablet (20 mEq total) by mouth 2 (two) times daily. Take with furosemide 60 tablet 5   pregabalin (LYRICA) 50 MG capsule Take 1 capsule (50 mg total) by mouth 2 (two) times daily. 60 capsule 2   rosuvastatin (CRESTOR) 40 MG tablet TAKE ONE TABLET BY MOUTH DAILY 90 tablet 3   Tiotropium Bromide Monohydrate (SPIRIVA RESPIMAT) 2.5 MCG/ACT AERS Inhale 2 puffs into the lungs daily. 30 each 11   torsemide (DEMADEX) 20 MG tablet Take 20 mg by mouth daily.     torsemide (DEMADEX) 20 MG tablet Take 1 tablet (20 mg total) by mouth as needed. 10 tablet 0   TRULICITY 3 MG/0.5ML SOPN Inject 3 mg into the skin daily.     VENTOLIN HFA 108 (90 Base) MCG/ACT inhaler INHALE TWO PUFFS INTO THE LUNGS EVERY SIX HOURS AS NEEDED FOR WHEEZING OR SHORTNESS OF BREATH 18 each 2   No current facility-administered medications for this visit.   Facility-Administered Medications Ordered in Other Visits  Medication Dose Route Frequency Provider Last Rate Last Admin   albuterol (PROVENTIL) (2.5 MG/3ML) 0.083% nebulizer solution 2.5 mg  2.5 mg Nebulization Once Raechel Chute, MD         PHYSICAL EXAM:  General:  Well appearing. No resp difficulty HEENT: neck stiff when turning head side to side Neck: supple. JVP flat. Carotids 2+ bilaterally; no bruits. No lymphadenopathy or thryomegaly appreciated. Cor: PMI  normal. Regular rate & rhythm. No rubs, gallops. 2/6 systolic murmur at LSB. Lungs: clear Abdomen: soft, nontender, nondistended. No hepatosplenomegaly. No bruits or masses. Good bowel sounds. Extremities: no cyanosis, clubbing, rash, edema Neuro: alert & oriented x3, cranial nerves grossly intact. Moves all 4 extremities w/o difficulty. Affect pleasant. 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 -  - continue jardiance 10mg  QD - continue torsemide 20mg  daily  - continue potassium to BID - continue lisinopril 40mg  daily  - continue carvedilol 12.5mg  BID - saw cardiology (Agbor-Etang) 06/24 - drinking less fluids and is trying to keep it closer to 60-64 ounces - not adding salt but continues to eat higher sodium foods  - BNP 03/21/23 was 53.5  2: HTN- - BP  - continue amlodipine 10mg  daily - saw PCP Angelica Chessman) 06/24  - BMP 03/25/23 reviewed and showed sodium 139, potassium 3.9, creatinine 1.27 and GFR >60  3: DM- - A1c 05/02/23 was 10.6% - currently on trulicity, metformin and insulin - saw endocrinology (Rhinehart) 07/24 - home glucose yesterday was  - 03/21/23 microalbumin/ creat ratio was 1338  4: Reactive airway disease- - saw pulmonology (Dgayli) 06/24 - using spiriva  - continues to not smoke - wearing CPAP most nights although fell asleep on the couch the last 2 nights so hadn't worn it  5: Hyperlipidemia- - continue ezetimibe 10mg  daily -  continue rosuvastatin 40mg  daily - lipid panel 03/21/23 showed LDL 130, triglycerides 536

## 2023-05-27 NOTE — ED Notes (Signed)
EDP at bedside  

## 2023-05-27 NOTE — ED Notes (Addendum)
Pt states has no itchy feeling at this time.

## 2023-05-27 NOTE — ED Notes (Signed)
Pt reports concern that blood sugar may be high

## 2023-05-27 NOTE — Assessment & Plan Note (Signed)
No concern of exacerbation at this time. -Continue home bronchodilator

## 2023-05-27 NOTE — ED Notes (Addendum)
Per EDP Bradler take BP Q10 minutes to decide on PRN nitro dosage up to three doses max

## 2023-05-27 NOTE — ED Provider Notes (Signed)
[facility] Provider Note   Event Date/Time   First Rivas Initiated Contact with Patient 05/27/23 1947     (approximate) History  Chest Pain and Shortness of Breath  HPI MARCIS Rivas is a 57 y.o. male with stated past medical history of hypertension and CHF who presents complaining of chest pain, shortness of breath, and hypertension.  Patient states he has taken all his medication on time and as prescribed however has had left-sided crushing substernal chest pain that has been present for the last 24 hours.  Patient does endorse worsening with exertion. ROS: Patient currently denies any vision changes, tinnitus, difficulty speaking, facial droop, sore throat, abdominal pain, nausea/vomiting/diarrhea, dysuria, or weakness/numbness/paresthesias in any extremity   Physical Exam  Triage Vital Signs: ED Triage Vitals  Encounter Vitals Group     BP 05/27/23 1947 (!) 210/103     Systolic BP Percentile --      Diastolic BP Percentile --      Pulse Rate 05/27/23 1947 67     Resp 05/27/23 1947 20     Temp 05/27/23 1947 98.5 F (36.9 C)     Temp Source 05/27/23 1947 Oral     SpO2 05/27/23 1947 97 %     Weight 05/27/23 1941 230 lb (104.3 kg)     Height 05/27/23 1941 5\' 6"  (1.676 m)     Head Circumference --      Peak Flow --      Pain Score 05/27/23 1941 8     Pain Loc --      Pain Education --      Exclude from Growth Chart --    Most recent vital signs: Vitals:   05/28/23 1000 05/28/23 1249  BP: (!) 152/85 (!) 179/96  Pulse: 72 (!) 56  Resp: 16 16  Temp: 97.7 F (36.5 C) 97.6 F (36.4 C)  SpO2: 94% 93%   General: Awake, oriented x4. CV:  Good peripheral perfusion.  Resp:  Normal effort.  Abd:  No distention.  Other:  Middle-aged overweight Caucasian male laying in bed in no acute distress ED Results / Procedures / Treatments  Labs (all labs ordered are listed, but only abnormal results are displayed) Labs Reviewed  COMPREHENSIVE METABOLIC PANEL -  Abnormal; Notable for the following components:      Result Value   Glucose, Bld 197 (*)    Creatinine, Ser 1.60 (*)    GFR, Estimated 50 (*)    All other components within normal limits  BRAIN NATRIURETIC PEPTIDE - Abnormal; Notable for the following components:   B Natriuretic Peptide 199.1 (*)    All other components within normal limits  BASIC METABOLIC PANEL - Abnormal; Notable for the following components:   Glucose, Bld 185 (*)    BUN 23 (*)    Creatinine, Ser 1.44 (*)    Calcium 8.6 (*)    GFR, Estimated 57 (*)    All other components within normal limits  CBG MONITORING, ED - Abnormal; Notable for the following components:   Glucose-Capillary 195 (*)    All other components within normal limits  TROPONIN I (HIGH SENSITIVITY) - Abnormal; Notable for the following components:   Troponin I (High Sensitivity) 26 (*)    All other components within normal limits  TROPONIN I (HIGH SENSITIVITY) - Abnormal; Notable for the following components:   Troponin I (High Sensitivity) 24 (*)    All other components within normal limits  CBC  CBC  EKG ED ECG REPORT I, Merwyn Katos, the attending physician, personally viewed and interpreted this ECG. Date: 05/27/2023 EKG Time: 1944 Rate: 62 Rhythm: normal sinus rhythm QRS Axis: normal Intervals: normal ST/T Wave abnormalities: normal Narrative Interpretation: no evidence of acute ischemia RADIOLOGY ED Rivas interpretation: 2 view chest x-ray interpreted by me shows no evidence of acute abnormalities including no pneumonia, pneumothorax, or widened mediastinum -Agree with radiology assessment Official radiology report(s): ECHOCARDIOGRAM COMPLETE  Result Date: 05/28/2023    ECHOCARDIOGRAM REPORT   Patient Name:   Cory Rivas Date of Exam: 05/28/2023 Medical Rec #:  [id number]       Height:       66.0 in Accession #:    [id number]      Weight:       229.5 lb Date of Birth:  [birth year]        BSA:          2.120 m Patient Age:    57  years        BP:           152/85 mmHg Patient Gender: M               HR:           60 bpm. Exam Location:  [facility] Procedure: 2D Echo, Cardiac Doppler, Color Doppler and Strain Analysis Indications:     Chest Pain  History:         Patient has prior history of Echocardiogram examinations, most                  recent 08/23/2022. CHF, COPD, Signs/Symptoms:Chest Pain and                  Murmur; Risk Factors:Hypertension, Diabetes and Former Smoker.                  CKD.  Sonographer:     Mikki Harbor Referring Phys:  [id number] SHERI HAMMOCK Diagnosing Phys: Cory Rivas  Sonographer Comments: Patient is obese. Image acquisition challenging due to respiratory motion. Global longitudinal strain was attempted. IMPRESSIONS  1. Left ventricular ejection fraction, by estimation, is 50 to 55%. The left ventricle has low normal function. The left ventricle has no regional wall motion abnormalities. There is moderate left ventricular hypertrophy. Left ventricular diastolic parameters are consistent with Grade III diastolic dysfunction (restrictive). Elevated left atrial pressure. The average left ventricular global longitudinal strain is -10.0 %. The global longitudinal strain is abnormal.  2. Right ventricular systolic function is normal. The right ventricular size is normal. Tricuspid regurgitation signal is inadequate for assessing PA pressure.  3. Left atrial size was mildly dilated.  4. The mitral valve was not well visualized. Mild mitral valve regurgitation.  5. The aortic valve has an indeterminant number of cusps. There is moderate calcification of the aortic valve. There is moderate thickening of the aortic valve. Aortic valve regurgitation is not visualized. Aortic valve sclerosis/calcification is present, without any evidence of aortic stenosis.  6. The inferior vena cava is normal in size with greater than 50% respiratory variability, suggesting right atrial pressure of 3 mmHg. FINDINGS  Left Ventricle:  Left ventricular ejection fraction, by estimation, is 50 to 55%. The left ventricle has low normal function. The left ventricle has no regional wall motion abnormalities. The average left ventricular global longitudinal strain is -10.0 %. The global longitudinal strain is abnormal. The left ventricular internal cavity size was normal in size. There is  moderate left ventricular hypertrophy. Left ventricular diastolic parameters are consistent with Grade III diastolic dysfunction (restrictive). Elevated left atrial pressure. Right Ventricle: The right ventricular size is normal. No increase in right ventricular wall thickness. Right ventricular systolic function is normal. Tricuspid regurgitation signal is inadequate for assessing PA pressure. Left Atrium: Left atrial size was mildly dilated. Right Atrium: Right atrial size was normal in size. Pericardium: There is no evidence of pericardial effusion. Mitral Valve: The mitral valve was not well visualized. Mild mitral valve regurgitation. MV peak gradient, 10.1 mmHg. The mean mitral valve gradient is 2.0 mmHg. Tricuspid Valve: The tricuspid valve is grossly normal. Tricuspid valve regurgitation is trivial. Aortic Valve: The aortic valve has an indeterminant number of cusps. There is moderate calcification of the aortic valve. There is moderate thickening of the aortic valve. Aortic valve regurgitation is not visualized. Aortic valve sclerosis/calcification  is present, without any evidence of aortic stenosis. Aortic valve mean gradient measures 7.3 mmHg. Aortic valve peak gradient measures 14.5 mmHg. Aortic valve area, by VTI measures 2.70 cm. Pulmonic Valve: The pulmonic valve was not well visualized. Pulmonic valve regurgitation is not visualized. No evidence of pulmonic stenosis. Aorta: The aortic root and ascending aorta are structurally normal, with no evidence of dilitation. Pulmonary Artery: The pulmonary artery is not well seen. Venous: The inferior vena cava  is normal in size with greater than 50% respiratory variability, suggesting right atrial pressure of 3 mmHg. IAS/Shunts: No atrial level shunt detected by color flow Doppler.  LEFT VENTRICLE PLAX 2D LVIDd:         5.30 cm      Diastology LVIDs:         4.15 cm      LV e' medial:    5.55 cm/s LV PW:         1.50 cm      LV E/e' medial:  23.1 LV IVS:        1.70 cm      LV e' lateral:   4.24 cm/s LVOT diam:     2.00 cm      LV E/e' lateral: 30.2 LV SV:         114 LV SV Index:   54           2D Longitudinal Strain LVOT Area:     3.14 cm     2D Strain GLS Avg:     -10.0 %  LV Volumes (MOD) LV vol d, MOD A2C: 90.7 ml LV vol d, MOD A4C: 103.0 ml LV vol s, MOD A2C: 36.2 ml LV vol s, MOD A4C: 45.4 ml LV SV MOD A2C:     54.5 ml LV SV MOD A4C:     103.0 ml LV SV MOD BP:      56.9 ml RIGHT VENTRICLE RV Basal diam:  3.65 cm RV Mid diam:    3.60 cm RV S prime:     11.60 cm/s TAPSE (M-mode): 2.9 cm LEFT ATRIUM             Index        RIGHT ATRIUM           Index LA diam:        4.30 cm 2.03 cm/m   RA Area:     18.20 cm LA Vol (A2C):   55.2 ml 26.03 ml/m  RA Volume:   48.50 ml  22.87 ml/m LA Vol (A4C):   58.5 ml 27.59 ml/m LA Biplane Vol: 57.3  ml 27.03 ml/m  AORTIC VALVE                     PULMONIC VALVE AV Area (Vmax):    2.74 cm      PV Vmax:       1.36 m/s AV Area (Vmean):   2.72 cm      PV Peak grad:  7.4 mmHg AV Area (VTI):     2.70 cm AV Vmax:           190.67 cm/s AV Vmean:          123.667 cm/s AV VTI:            0.423 m AV Peak Grad:      14.5 mmHg AV Mean Grad:      7.3 mmHg LVOT Vmax:         166.50 cm/s LVOT Vmean:        107.050 cm/s LVOT VTI:          0.364 m LVOT/AV VTI ratio: 0.86  AORTA Ao Root diam: 3.40 cm Ao Asc diam:  3.40 cm MITRAL VALVE MV Area (PHT): 3.17 cm     SHUNTS MV Area VTI:   2.47 cm     Systemic VTI:  0.36 m MV Peak grad:  10.1 mmHg    Systemic Diam: 2.00 cm MV Mean grad:  2.0 mmHg MV Vmax:       1.59 m/s MV Vmean:      67.3 cm/s MV Decel Time: 239 msec MV E velocity: 128.00 cm/s MV  A velocity: 51.40 cm/s MV E/A ratio:  2.49 Cristal [place] Rivas Electronically signed by Cory Rivas Signature Date/Time: 05/28/2023/3:00:09 PM    Final    DG Chest 2 View  Result Date: 05/27/2023 CLINICAL DATA:  Chest pain EXAM: CHEST - 2 VIEW COMPARISON:  03/21/2023 FINDINGS: Hardware in the cervical spine. No acute airspace disease or effusion. Normal cardiac size. No pneumothorax IMPRESSION: No active cardiopulmonary disease. Electronically Signed   By: Jasmine Pang M.D.   On: 05/27/2023 20:09   PROCEDURES: Critical Care performed: No .1-3 Lead EKG Interpretation  Performed by: Merwyn Katos, Rivas Authorized by: Merwyn Katos, Rivas     Interpretation: normal     ECG rate:  61   ECG rate assessment: normal     Rhythm: sinus rhythm     Ectopy: none     Conduction: normal    MEDICATIONS ORDERED IN ED: Medications  nitroGLYCERIN (NITROSTAT) SL tablet 0.4 mg (0.4 mg Sublingual Given 05/27/23 2053)  nitroGLYCERIN 50 mg in dextrose 5 % 250 mL (0.2 mg/mL) infusion ( Intravenous Not Given 05/27/23 2248)  allopurinol (ZYLOPRIM) tablet 300 mg (300 mg Oral Given 05/28/23 0913)  amLODipine (NORVASC) tablet 10 mg (10 mg Oral Given 05/28/23 0913)  ezetimibe (ZETIA) tablet 10 mg (10 mg Oral Given 05/28/23 0916)  carvedilol (COREG) tablet 12.5 mg (12.5 mg Oral Given 05/28/23 1604)  rosuvastatin (CRESTOR) tablet 40 mg (40 mg Oral Given 05/28/23 0913)  DULoxetine (CYMBALTA) DR capsule 30 mg (has no administration in time range)  insulin glargine-yfgn (SEMGLEE) injection 20 Units (20 Units Subcutaneous Given 05/27/23 2234)  empagliflozin (JARDIANCE) tablet 10 mg (10 mg Oral Given 05/28/23 0914)  pantoprazole (PROTONIX) EC tablet 40 mg (40 mg Oral Given 05/28/23 0916)  pregabalin (LYRICA) capsule 50 mg (50 mg Oral Given 05/28/23 0913)  potassium chloride SA (KLOR-CON M) CR tablet 20 mEq (20 mEq Oral Given 05/28/23 0914)  fluticasone (FLONASE) 50 MCG/ACT nasal spray 1 spray (has no administration in time  range)  ipratropium-albuterol (DUONEB) 0.5-2.5 (3) MG/3ML nebulizer solution 3 mL (has no administration in time range)  tiotropium (SPIRIVA) inhalation capsule ([facility] use ONLY) 18 mcg (18 mcg Inhalation Given 05/28/23 0915)  albuterol (PROVENTIL) (2.5 MG/3ML) 0.083% nebulizer solution 2.5 mg (has no administration in time range)  enoxaparin (LOVENOX) injection 52.5 mg (52.5 mg Subcutaneous Given 05/27/23 2259)  acetaminophen (TYLENOL) tablet 650 mg (has no administration in time range)    Or  acetaminophen (TYLENOL) suppository 650 mg (has no administration in time range)  traZODone (DESYREL) tablet 25 mg (has no administration in time range)  magnesium hydroxide (MILK OF MAGNESIA) suspension 30 mL (has no administration in time range)  ondansetron (ZOFRAN) tablet 4 mg (has no administration in time range)    Or  ondansetron (ZOFRAN) injection 4 mg (has no administration in time range)  hydrALAZINE (APRESOLINE) injection 10 mg (10 mg Intravenous Given 05/27/23 2303)  lisinopril (ZESTRIL) tablet 40 mg (40 mg Oral Given 05/28/23 1242)  diphenhydrAMINE (BENADRYL) capsule 25 mg (25 mg Oral Given 05/28/23 1558)  aspirin EC tablet 81 mg (has no administration in time range)  diphenhydrAMINE (BENADRYL) injection 25 mg (25 mg Intravenous Given 05/27/23 2104)   IMPRESSION / MDM / ASSESSMENT AND PLAN / ED COURSE  I reviewed the triage vital signs and the nursing notes.                             The patient is on the cardiac monitor to evaluate for evidence of arrhythmia and/or significant heart rate changes. Patient's presentation is most consistent with acute presentation with potential threat to life or bodily function. 57 year old male presents for chest pain in the setting of congestive heart failure and hypertension Workup: ECG, CXR, CBC, BMP, Troponin Findings: ECG: No overt evidence of STEMI. No evidence of Brugada's sign, delta wave, epsilon wave, significantly prolonged QTc, or malignant  arrhythmia HS Troponin: 26 Other Labs unremarkable for emergent problems. CXR: Without PTX, PNA, or widened mediastinum Last Stress Test:  2021 Last Heart Catheterization:  NA HEART Score: 6  Given History, Exam, and Workup I have low suspicion for ACS, Pneumothorax, Pneumonia, Pulmonary Embolus, Tamponade, Aortic Dissection or other emergent problem as a cause for this presentation.   High Risk Chest Pain Patient at increased risk for Major Adverse Cardiac Event (AMI, PCI, CABG, death) Interventions: ASA 324mg  Defer Heparin drip as patient pain free at this time,   Disposition: Admit for continued cardiac monitoring and trending of troponins as well as further evaluation for potential inpatient stress testing vs cardiac catheterization and coronary angiography.   FINAL CLINICAL IMPRESSION(S) / ED DIAGNOSES   Final diagnoses:  Chest pain, unspecified type  Elevated troponin   Rx / DC Orders   ED Discharge Orders     None      Note:  This document was prepared using Dragon voice recognition software and may include unintentional dictation errors.   Merwyn Katos, Rivas 05/28/23 [phone]

## 2023-05-27 NOTE — Assessment & Plan Note (Addendum)
Likely secondary to hypertensive urgency as his blood pressure was significantly elevated.  Troponin barely positive with a flat curve. Cardiology is on board Echocardiogram done-pending results Likely will need cardiac catheterization before discharge per cardiology

## 2023-05-27 NOTE — ED Notes (Signed)
Patient transported to X-ray 

## 2023-05-27 NOTE — Assessment & Plan Note (Signed)
-   We will continue allopurinol 

## 2023-05-27 NOTE — Assessment & Plan Note (Signed)
History of underlying CKD stage III a, renal function started improving, close to baseline now. - We will hold off nephrotoxins. - Will follow BMPs.

## 2023-05-27 NOTE — ED Notes (Signed)
Called MD at this time to give him a patient update.

## 2023-05-28 ENCOUNTER — Other Ambulatory Visit: Payer: Self-pay

## 2023-05-28 ENCOUNTER — Encounter: Payer: Medicaid Other | Admitting: Family

## 2023-05-28 ENCOUNTER — Observation Stay (HOSPITAL_BASED_OUTPATIENT_CLINIC_OR_DEPARTMENT_OTHER)
Admit: 2023-05-28 | Discharge: 2023-05-28 | Disposition: A | Discharge status: Home / Self Care | Payer: Medicaid Other | Attending: Cardiology | Admitting: Cardiology

## 2023-05-28 DIAGNOSIS — I5033 Acute on chronic diastolic (congestive) heart failure: Secondary | ICD-10-CM

## 2023-05-28 DIAGNOSIS — E1142 Type 2 diabetes mellitus with diabetic polyneuropathy: Secondary | ICD-10-CM | POA: Diagnosis not present

## 2023-05-28 DIAGNOSIS — K219 Gastro-esophageal reflux disease without esophagitis: Secondary | ICD-10-CM | POA: Diagnosis not present

## 2023-05-28 DIAGNOSIS — I160 Hypertensive urgency: Secondary | ICD-10-CM | POA: Diagnosis not present

## 2023-05-28 DIAGNOSIS — I200 Unstable angina: Secondary | ICD-10-CM | POA: Diagnosis not present

## 2023-05-28 DIAGNOSIS — Z6839 Body mass index (BMI) 39.0-39.9, adult: Secondary | ICD-10-CM

## 2023-05-28 DIAGNOSIS — R079 Chest pain, unspecified: Secondary | ICD-10-CM | POA: Diagnosis not present

## 2023-05-28 DIAGNOSIS — N179 Acute kidney failure, unspecified: Secondary | ICD-10-CM | POA: Diagnosis not present

## 2023-05-28 DIAGNOSIS — I25110 Atherosclerotic heart disease of native coronary artery with unstable angina pectoris: Secondary | ICD-10-CM | POA: Diagnosis not present

## 2023-05-28 LAB — ECHOCARDIOGRAM COMPLETE
AR max vel: 2.74 cm2
AV Area VTI: 2.7 cm2
AV Area mean vel: 2.72 cm2
AV Mean grad: 7.3 mmHg
AV Peak grad: 14.5 mmHg
Ao pk vel: 1.91 m/s
Area-P 1/2: 3.17 cm2
Calc EF: 58.2 %
Height: 66 in
MV VTI: 2.47 cm2
S' Lateral: 4.15 cm
Single Plane A2C EF: 60.1 %
Single Plane A4C EF: 55.9 %
Weight: 3672 oz

## 2023-05-28 LAB — GLUCOSE, CAPILLARY
Glucose-Capillary: 252 mg/dL — ABNORMAL HIGH (ref 70–99)
Glucose-Capillary: 242 mg/dL — ABNORMAL HIGH (ref 70–99)

## 2023-05-28 LAB — TROPONIN I (HIGH SENSITIVITY)
Troponin I (High Sensitivity): 17 ng/L (ref <18)
Troponin I (High Sensitivity): 17 ng/L (ref <18)

## 2023-05-28 MED ORDER — SODIUM CHLORIDE 0.9 % IV SOLN: INTRAVENOUS | Status: DC

## 2023-05-28 MED ORDER — DIPHENHYDRAMINE HCL 25 MG PO CAPS
25.0000 mg | ORAL_CAPSULE | Freq: Four times a day (QID) | ORAL | Status: DC | PRN
  Administered 2023-05-28 – 2023-05-29 (×2): 25 mg via ORAL
  Filled 2023-05-28 (×2): qty 1

## 2023-05-28 MED ORDER — INSULIN ASPART 100 UNIT/ML IJ SOLN
0.0000 [IU] | Freq: Every day | INTRAMUSCULAR | Status: DC
  Administered 2023-05-28: 2 [IU] via SUBCUTANEOUS
  Filled 2023-05-28: qty 1

## 2023-05-28 MED ORDER — INSULIN ASPART 100 UNIT/ML IJ SOLN
0.0000 [IU] | Freq: Three times a day (TID) | INTRAMUSCULAR | Status: DC
  Administered 2023-05-29: 3 [IU] via SUBCUTANEOUS
  Filled 2023-05-28: qty 1

## 2023-05-28 MED ORDER — ASPIRIN 81 MG PO TBEC
81.0000 mg | DELAYED_RELEASE_TABLET | Freq: Every day | ORAL | Status: DC
  Administered 2023-05-28 – 2023-05-30 (×3): 81 mg via ORAL
  Filled 2023-05-28 (×3): qty 1

## 2023-05-28 MED ORDER — NITROGLYCERIN 0.4 MG SL SUBL: 0.4000 mg | SUBLINGUAL_TABLET | SUBLINGUAL | Status: DC | PRN

## 2023-05-28 MED ORDER — INSULIN ASPART 100 UNIT/ML IJ SOLN
4.0000 [IU] | Freq: Three times a day (TID) | INTRAMUSCULAR | Status: DC
  Administered 2023-05-29 – 2023-05-30 (×3): 4 [IU] via SUBCUTANEOUS
  Filled 2023-05-28 (×3): qty 1

## 2023-05-28 MED ORDER — LISINOPRIL 20 MG PO TABS
40.0000 mg | ORAL_TABLET | Freq: Every day | ORAL | Status: DC
  Administered 2023-05-28 – 2023-05-30 (×3): 40 mg via ORAL
  Filled 2023-05-28 (×3): qty 2

## 2023-05-28 MED ORDER — ASPIRIN 81 MG PO CHEW
81.0000 mg | CHEWABLE_TABLET | ORAL | Status: AC
  Administered 2023-05-29: 81 mg via ORAL
  Filled 2023-05-28: qty 1

## 2023-05-28 MED ORDER — MORPHINE SULFATE (PF) 2 MG/ML IV SOLN
2.0000 mg | INTRAVENOUS | Status: DC | PRN
  Administered 2023-05-29: 2 mg via INTRAVENOUS
  Filled 2023-05-28: qty 1

## 2023-05-28 NOTE — Assessment & Plan Note (Signed)
Estimated body mass index is 37.04 kg/m as calculated from the following:   Height as of this encounter: 5\' 6"  (1.676 m).   Weight as of this encounter: 104.1 kg.   -This will complicate overall prognosis -Encouraged weight loss

## 2023-05-28 NOTE — Progress Notes (Signed)
*  PRELIMINARY RESULTS* Echocardiogram 2D Echocardiogram has been performed.  Carolyne Fiscal 05/28/2023, 1:56 PM

## 2023-05-28 NOTE — Hospital Course (Addendum)
Taken from H&P.  Cory Rivas is a 57 y.o. Caucasian male with medical history significant for CHF, type 2 diabetes mellitus, gout, hypertension COPD, asthma and dyslipidemia, who presented to the emergency room with acute onset of chest pain with associated dyspnea and diaphoresis with no radiation.   On presentation patient had significantly elevated blood pressure at 204/102, labs with creatinine of 1.6 with baseline of 1.2-1.4.  BNP 199.  Troponin 26>>24. EKG NSR with T wave inversions laterally. Chest x-ray was negative for any acute cardiopulmonary disease.  Admitted for concern of hypertensive urgency.  8/13: Blood pressure improved to 154/90.  Creatinine improving at 1.44.  Holding home torsemide, cardiology is planning to do IV diuresis if renal function allows.  Repeat echocardiogram done-pending results.  Likely will need cardiac catheterization during current hospitalization per cardiology.  Restarted home lisinopril

## 2023-05-28 NOTE — Discharge Instructions (Signed)
Food Resources  Agency Name: [facility] Agency Address: [address], [place], [place] [address] Phone: [phone] Website: [email or web address] Service(s) Offered: Housing services, self-sufficiency, congregate meal program, weatherization program, Event organiser program, emergency food assistance,  housing counseling, home ownership program, wheels - to work program.  [employer] free for 60 and older at various locations from [employer], Monday-Friday:  [employer], [address] [place], [phone] -[facility], [address], [place] [phone]  -[facility], [address] [place] [phone]  -[address], [place], [phone]  Agency Name: [facility] Address: [phone] [address], [place], [place] [address] Phone: [phone] Website: [email or web address] Service(s) Offered: Home delivered hot, frozen, and emergency  meals. Grocery assistance program which matches  volunteers one-on-one with seniors unable to grocery shop  for themselves. Must be 60 years and older; less than 20  hours of in-home aide service, limited or no driving ability;  live alone or with someone with a disability; live in  [place].  Agency Name: [employer] [facility] [employer]) Address: [address], [place], [place] [address] Phone: [phone] Service(s) Offered: Food is served to shut-ins, homeless, elderly, and low income people in the community every Saturday (11:30 am-12:30 pm) and Sunday (12:30 pm-1:30pm). Volunteers also offer help and encouragement in seeking employment,  and spiritual guidance.  Agency Name: [employer] Address: [address] [place], [place] [address] Phone: [phone] Service(s) Offered: Child support services; child welfare services; food stamps; Medicaid; work first family assistance; and aid  with fuel,  rent, food and medicine.  Agency Name: [employer] Address: [address], [place], [place] Phone: [phone] Website: [email or web address] Services Offered: Monday 10:00am-12:00, 8:00pm-9:00pm, and Friday 10:00am-12:00.  Agency Name: [employer] Address: [address] [place], [place] [address] Phone: [phone] Website: [email or web address] Service(s) Offered: Serves weekday meals, open from 11:30 am- 1:00 pm., and 6:30-7:30pm, Monday-Wednesday-Friday distributes food 3:30-6pm, Monday-Wednesday-Friday.  Agency Name: [facility] Address: [address], [place], [place] Phone: [phone] Website: [email or web address] Services Offered: Distributes food the 4th Saturday of the month, starting at 8:00 am  Agency Name: [facility] Address: [phone] [address], [place], [place] [address] Phone: [phone] Website: [email or web address] Service(s) Offered: [employer] life, weekly food pantry. Open Wednesdays from 10:00am-noon.  Agency Name: [facility] [employer] Address: [address] [place], [place], [place] Phone: [phone] Services Offered: Distributes food 9am-1pm, Monday-Thursday. Call for details.  Agency Name: [facility] Address: [address], [place], [place] [address] Phone: [phone] Website: [email or web address] Service(s) Offered: Games developer. Call for assistance.  Agency Name: [facility] Address: [address], [place], [place] [address] Phone: [phone] Service Offered: Emergency Food Pantry. Call for appointment.  Agency Name: [facility] Address: [address] [place], [place] [address] Phone: [phone] Website: [email or web address] Services Offered: Games developer. Call for details  Agency Name: [facility] Address: [address] [place], [place] Phone:  [phone] Website: [email or web address] Service(s) Offered: Emergency Food Pantry. Call for details.  Agency Name: Holiday representative Address: [address], [place], [place] [address] Phone: [phone] or [phone] Website: [email or web address] Service(s) Offered: Distribute food 9am-11:30 am, Tuesday-Friday, and 1-3:30pm, Monday-Friday. Food pantry Monday-Friday 1pm-3pm, fresh items, Mon.-Wed.-Fri.  Agency Name: [facility] Address: [address] [place], [place] [address] Phone: [phone] Website: [email or web address] Services Offered: Distribute food Tues and Sats from 9:00am-noon.  Closed 1st Saturday of each month. Call for details

## 2023-05-28 NOTE — H&P (View-Only) (Signed)
Cardiology Consultation   Patient ID: DEMARCOS Rivas MRN: [id number]; DOB: 12/21/1965  Admit date: 05/27/2023 Date of Consult: 05/28/2023  PCP:  Danelle Berry, PA-C   [facility] Providers Cardiologist:  Debbe Odea, MD        Patient Profile:   Cory Rivas is a 57 y.o. male with a hx of hypertension, LVH, chronic diastolic congestive heart failure, respiratory failure with hypoxia, hyperlipidemia, type 2 diabetes, cervical myelopathy, osteoarthritis of spine, CKD stage IIIa, idiopathic chronic gout, heart murmur, class II obesity, smoker x 35 years, who is being seen 05/28/2023 for the evaluation of chest pain and shortness of breath at the request of Dr Nelson Chimes.  History of Present Illness:   Cory Rivas has followed with cardiology since 09/2020.  He had cardiac clearance needed and a newfound heart murmur found by his PCP which required cardiovascular workup.  He underwent Lexiscan Myoview that showed no significant ischemia and no significant coronary artery calcification on attenuation correction CT images.  Echocardiogram revealed LVEF 60 to 65%, no regional wall motion abnormalities, G1 DD, no significant valvular abnormalities.  Repeat echo revealed normal LV function, no RWMA, G2 DD, mild mitral regurgitation.  He was last seen in clinic 04/12/2023 by Dr.Agbor-Etang.  At that time he noted blood pressures are still elevated at home he was still smoking and working on quitting.  Denies any chest pains or shortness of breath.  Blood pressure was found to be 174/98.  He was continued on torsemide 40 mg daily, increase Coreg to 12.5 mg twice daily continued with his lisinopril 40 mg daily, hydralazine 100 mg 3 times daily, and amlodipine 10 mg daily.  He presents to the [facility] emergency department on 05/27/2023 with reports of substernal chest pain and shortness of breath with diaphoresis after eating dinner at 5:00.  [name].  He describes chest pain as left-sided crushing pain that been present for the last 24 hours.  It caused worsening shortness of breath.  He noted no aggravating or alleviating symptoms.  He was given 3 sublingual nitro sent to the emergency department unfortunately he developed rash.  They did help with the discomfort that he was having.  On examination this morning continues to complain of a cramping type feeling that is still sore, continued shortness of breath, but without the diaphoreses.  His arms and hands are still slightly itchy.  Initial vitals: Blood pressure 210/103, pulse of 67, respiration of 20, temperature 98.5  Pertinent labs: High-sensitivity troponin 26 and 24, BNP 199.1, blood glucose 197, serum creatinine 1.60, potassium 3.7, chloride 101, CO2 26, GFR 50, CBC was unremarkable  Imaging: Chest x-ray revealed no acute cardiopulmonary disease.  Medications administered in the emergency department: Sublingual Nitrostat x 3 doses,(broke out into itching and a rash with nitro), IV Benadryl 25 mg  Cardiology was consulted this morning for evaluation of his chest pain and shortness of breath.  Past Medical History:  Diagnosis Date   Bulging of cervical intervertebral disc    CHF (congestive heart failure) (HCC)    Diabetes mellitus without complication (HCC)    Gout    High cholesterol    Hypertension    Stress due to family tension 04/15/2019    Past Surgical History:  Procedure Laterality Date   CHOLECYSTECTOMY     COLONOSCOPY WITH PROPOFOL N/A 03/10/2020   Procedure: COLONOSCOPY WITH PROPOFOL;  Surgeon: Servando Snare,  Darren, MD;  Location: [facility] ENDOSCOPY;  Service: Endoscopy;  Laterality: N/A;     Home Medications:  Prior to Admission medications   Medication Sig Start Date End Date Taking? Authorizing Provider  allopurinol (ZYLOPRIM) 300 MG tablet TAKE ONE TABLET BY MOUTH DAILY WITH 100 MG TABLET. FOR A TOTAL OF 400 MG  DAILY FOR GOUT 03/05/23  Yes Danelle Berry, PA-C  amLODipine (NORVASC) 10 MG tablet TAKE ONE TABLET BY MOUTH DAILY 01/11/23  Yes Danelle Berry, PA-C  carvedilol (COREG) 12.5 MG tablet Take 1 tablet (12.5 mg total) by mouth 2 (two) times daily with a meal. 04/12/23  Yes Agbor-Etang, Arlys John, MD  DULoxetine (CYMBALTA) 30 MG capsule TAKE ONE CAPSULE BY MOUTH TWO TIMES DAILY Patient taking differently: Take 30 mg by mouth daily as needed. 01/11/23  Yes Danelle Berry, PA-C  ezetimibe (ZETIA) 10 MG tablet TAKE ONE TABLET BY MOUTH DAILY 01/11/23  Yes Danelle Berry, PA-C  fluticasone (FLONASE) 50 MCG/ACT nasal spray Place 1 spray into both nostrils daily. Patient taking differently: Place 1 spray into both nostrils daily as needed for rhinitis or allergies. 01/23/23 01/23/24 Yes Danelle Berry, PA-C  insulin glargine (LANTUS SOLOSTAR) 100 UNIT/ML Solostar Pen Inject 20 Units into the skin at bedtime. 11/01/20  Yes [provider]  insulin lispro (HUMALOG) 100 UNIT/ML KwikPen Inject 1-45 Units into the skin 3 (three) times daily. Sliding scale up to 45 units per injection 11/08/20  Yes [provider]  ipratropium-albuterol (DUONEB) 0.5-2.5 (3) MG/3ML SOLN Take 3 mLs by nebulization every 6 (six) hours as needed (SOB wheeze cough). 07/23/22  Yes Danelle Berry, PA-C  JARDIANCE 10 MG TABS tablet TAKE ONE TABLET BY MOUTH DAILY BEFORE BREAKFAST 01/11/23  Yes Delma Freeze, FNP  levocetirizine (XYZAL) 5 MG tablet TAKE ONE TABLET BY MOUTH EVERY EVENING 01/11/23  Yes Danelle Berry, PA-C  lisinopril (ZESTRIL) 40 MG tablet Take 1 tablet (40 mg total) by mouth daily. 02/21/23 05/27/23 Yes Agbor-Etang, Arlys John, MD  metFORMIN (GLUCOPHAGE) 1000 MG tablet TAKE ONE TABLET BY MOUTH TWO TIMES A DAY6 WITHA MEAL Patient taking differently: Take 1,000 mg by mouth at bedtime. 01/11/23  Yes Danelle Berry, PA-C  pantoprazole (PROTONIX) 40 MG tablet Take 1 tablet (40 mg total) by mouth 2 (two) times daily. 03/25/23  Yes Danelle Berry, PA-C   potassium chloride SA (KLOR-CON M) 20 MEQ tablet Take 1 tablet (20 mEq total) by mouth 2 (two) times daily. Take with furosemide 01/10/23  Yes Hackney, Inetta Fermo A, FNP  rosuvastatin (CRESTOR) 40 MG tablet TAKE ONE TABLET BY MOUTH DAILY 01/11/23  Yes Danelle Berry, PA-C  Tiotropium Bromide Monohydrate (SPIRIVA RESPIMAT) 2.5 MCG/ACT AERS Inhale 2 puffs into the lungs daily. 09/24/22  Yes Dgayli, Lianne Bushy, MD  torsemide (DEMADEX) 20 MG tablet Take 20 mg by mouth daily.   Yes [provider]  TRULICITY 3 MG/0.5ML SOPN Inject 3 mg into the skin once a week. SATURDAYS 03/26/22  Yes [provider]  VENTOLIN HFA 108 (90 Base) MCG/ACT inhaler INHALE TWO PUFFS INTO THE LUNGS EVERY SIX HOURS AS NEEDED FOR WHEEZING OR SHORTNESS OF BREATH 04/26/23  Yes Angelica Chessman, Leisa, PA-C  lidocaine (LIDODERM) 5 % Place 1 patch onto the skin every 12 (twelve) hours. Remove & Discard patch within 12 hours or as directed by MD Patient not taking: Reported on 05/27/2023 03/01/23 02/29/24  Phineas Semen, MD  pregabalin (LYRICA) 50 MG capsule Take 1 capsule (50 mg total) by mouth 2 (two) times daily. 02/22/23   Danelle Berry, PA-C  torsemide (DEMADEX) 20 MG tablet Take 1 tablet (20 mg total) by mouth as needed. 04/22/23   Delma Freeze, FNP    Inpatient Medications: Scheduled Meds:  allopurinol  300 mg Oral Daily   amLODipine  10 mg Oral Daily   carvedilol  12.5 mg Oral BID WC   empagliflozin  10 mg Oral Daily   enoxaparin (LOVENOX) injection  0.5 mg/kg Subcutaneous Q24H   ezetimibe  10 mg Oral Daily   insulin glargine-yfgn  20 Units Subcutaneous QHS   lisinopril  40 mg Oral Daily   pantoprazole  40 mg Oral BID   potassium chloride SA  20 mEq Oral BID   pregabalin  50 mg Oral BID   rosuvastatin  40 mg Oral Daily   tiotropium  18 mcg Inhalation Daily   Continuous Infusions:  nitroGLYCERIN Stopped (05/27/23 2143)   PRN Meds: acetaminophen **OR** acetaminophen, albuterol, DULoxetine, fluticasone, hydrALAZINE,  ipratropium-albuterol, magnesium hydroxide, nitroGLYCERIN, ondansetron **OR** ondansetron (ZOFRAN) IV, traZODone  Allergies:    Allergies  Allergen Reactions   Oxycodone Nausea And Vomiting   Entresto [Sacubitril-Valsartan] Nausea Only and Other (See Comments)    Lightheaded and dizzy    Onion Nausea And Vomiting    Social History:   Social History   Socioeconomic History   Marital status: Divorced    Spouse name: Not on file   Number of children: 0   Years of education: 9   Highest education level: 9th grade  Occupational History   Occupation: disability  Tobacco Use   Smoking status: Some Days    Current packs/day: 0.00    Average packs/day: 1 pack/day for 37.0 years (37.0 ttl pk-yrs)    Types: Cigarettes    Start date: 10/19/1989    Last attempt to quit: 08/20/2022    Years since quitting: 0.7   Smokeless tobacco: Never   Tobacco comments:    30+ years hx smoking 2 ppd, stopped smoking briefly in April 2022 for a surgery and then restarted smoking about 1/2 ppd last 4-5 months         5-6cigs daily- 04/10/2023  Vaping Use   Vaping status: Never Used  Substance and Sexual Activity   Alcohol use: No   Drug use: No   Sexual activity: Not Currently    Partners: Female  Other Topics Concern   Not on file  Social History Narrative   Lives with sister [name], brother in law [name]), and his mother - Zackaree Callery   Social Determinants of Health   Financial Resource Strain: Medium Risk (12/06/2022)   Overall Financial Resource Strain (CARDIA)    Difficulty of Paying Living Expenses: Somewhat hard  Food Insecurity: Food Insecurity Present (05/28/2023)   Hunger Vital Sign    Worried About Running Out of Food in the Last Year: Often true    Ran Out of Food in the Last Year: Often true  Transportation Needs: No Transportation Needs (05/28/2023)   PRAPARE - Administrator, Civil Service (Medical): No    Lack of Transportation (Non-Medical): No  Physical Activity:  Insufficiently Active (12/06/2022)   Exercise Vital Sign    Days of Exercise per Week: 3 days    Minutes of Exercise per Session: 10 min  Stress: No Stress Concern Present (12/06/2022)   [employer] of Occupational Health - Occupational Stress Questionnaire    Feeling of Stress : Only a little  Social Connections: Socially Isolated (12/06/2022)   Social Connection and Isolation Panel [NHANES]  Frequency of Communication with Friends and Family: More than three times a week    Frequency of Social Gatherings with Friends and Family: Twice a week    Attends Religious Services: Never    Database administrator or Organizations: No    Attends Banker Meetings: Never    Marital Status: Divorced  Catering manager Violence: Not At Risk (05/28/2023)   Humiliation, Afraid, Rape, and Kick questionnaire    Fear of Current or Ex-Partner: No    Emotionally Abused: No    Physically Abused: No    Sexually Abused: No    Family History:    Family History  Problem Relation Age of Onset   Heart disease Mother    Diabetes Mother    Hyperlipidemia Mother    Hypertension Mother    Heart attack Mother    Lung cancer Father    Hypertension Father    Stroke Father    AAA (abdominal aortic aneurysm) Maternal Grandmother    Heart attack Maternal Grandmother    Diabetes Maternal Grandfather      ROS:  Please see the history of present illness.  Review of Systems  Constitutional:  Positive for malaise/fatigue.  Respiratory:  Positive for shortness of breath and wheezing.   Cardiovascular:  Positive for chest pain and leg swelling.  Musculoskeletal:  Positive for back pain and neck pain.  Skin:  Positive for itching and rash.    All other ROS reviewed and negative.     Physical Exam/Data:   Vitals:   05/28/23 0311 05/28/23 0913 05/28/23 1000 05/28/23 1249  BP: (!) 154/90 (!) 173/86 (!) 152/85 (!) 179/96  Pulse: 71 71 72 (!) 56  Resp: 18  16 16   Temp: 98.4 F (36.9 C) 98.3  F (36.8 C) 97.7 F (36.5 C) 97.6 F (36.4 C)  TempSrc:  Oral Oral Oral  SpO2: 95% 96% 94% 93%  Weight: 104.1 kg     Height:        Intake/Output Summary (Last 24 hours) at 05/28/2023 1458 Last data filed at 05/28/2023 1300 Gross per 24 hour  Intake 720 ml  Output --  Net 720 ml      05/28/2023    3:11 AM 05/27/2023    7:41 PM 04/22/2023    8:41 AM  Last 3 Weights  Weight (lbs) 229 lb 8 oz 230 lb 229 lb  Weight (kg) 104.101 kg 104.327 kg 103.874 kg     Body mass index is 37.04 kg/m.  General:  Well nourished, well developed, in no acute distress HEENT: normal Neck: no JVD Vascular: No carotid bruits; Distal pulses 2+ bilaterally Cardiac:  normal S1, S2; RRR; II/VI murmur  without rubs or gallops Lungs:  clear upper lobes with diminished bases to auscultation bilaterally, no wheezing, rhonchi or rales, respirations are unlabored on room air Abd: soft, nontender, no hepatomegaly  Ext: trace pretibial edema Musculoskeletal:  No deformities, BUE and BLE strength normal and equal Skin: warm and dry  Neuro:  CNs 2-12 intact, no focal abnormalities noted Psych:  Normal affect   EKG:  The EKG was personally reviewed and demonstrates:  sinus rate og 62 with ST depression and TWI in inferolateral leads Telemetry:  Telemetry was personally reviewed and demonstrates: Sinus rhythm rate of 60-70 with 1 burst of atrial tach 11 beats long  Relevant CV Studies: TTE 08/23/2022 1. Left ventricular ejection fraction, by estimation, is 60 to 65%. The  left ventricle has normal function. The left ventricle  has no regional  wall motion abnormalities. There is mild left ventricular hypertrophy.  Left ventricular diastolic parameters  are consistent with Grade II diastolic dysfunction (pseudonormalization).   2. Right ventricular systolic function is normal. The right ventricular  size is normal.   3. The mitral valve is normal in structure. Mild mitral valve  regurgitation. No evidence of  mitral stenosis.   4. The aortic valve is calcified. Aortic valve regurgitation is not  visualized. Aortic valve sclerosis/calcification is present, without any  evidence of aortic stenosis.   5. The inferior vena cava is normal in size with greater than 50%  respiratory variability, suggesting right atrial pressure of 3 mmHg.   TTE 11/04/2020 1. Left ventricular ejection fraction, by estimation, is 60 to 65%. The  left ventricle has normal function. The left ventricle has no regional  wall motion abnormalities. Left ventricular diastolic parameters are  consistent with Grade I diastolic  dysfunction (impaired relaxation).There is mild left ventricular  hypertrophy of the basal segment. LVOT gradient of 10 mm Hg at rest, 22 mm  Hg gradient with valsalva   2. Right ventricular systolic function is normal. The right ventricular  size is normal. There is normal pulmonary artery systolic pressure. The  estimated right ventricular systolic pressure is 31.6 mmHg.   3. Left atrial size was mildly dilated.   Lexiscan MPI 10/10/2020 Narrative & Impression  Low risk, probably normal pharmacologic myocardial perfusion stress tst. There is a small in size, moderate in severity, fixed defect at the apex most likely representing artifact (apical thinning and attenuation) and less likely scar. There is no evidence of significant ischemia. The left ventricular ejection fraction is normal (59%). There is no significant coronary artery calcification on the attenuation correction CT.    Laboratory Data:  High Sensitivity Troponin:   Recent Labs  Lab 05/27/23 1954 05/27/23 2126  TROPONINIHS 26* 24*     Chemistry Recent Labs  Lab 05/27/23 1954 05/28/23 0300  NA 138 138  K 3.7 3.6  CL 101 103  CO2 26 25  GLUCOSE 197* 185*  BUN 18 23*  CREATININE 1.60* 1.44*  CALCIUM 9.0 8.6*  GFRNONAA 50* 57*  ANIONGAP 11 10    Recent Labs  Lab 05/27/23 1954  PROT 7.5  ALBUMIN 3.8  AST 15  ALT 17   ALKPHOS 69  BILITOT 0.8   Lipids No results for input(s): "CHOL", "TRIG", "HDL", "LABVLDL", "LDLCALC", "CHOLHDL" in the last 168 hours.  Hematology Recent Labs  Lab 05/27/23 1954 05/28/23 0300  WBC 8.8 9.6  RBC 5.50 5.25  HGB 15.8 15.3  HCT 45.8 43.9  MCV 83.3 83.6  MCH 28.7 29.1  MCHC 34.5 34.9  RDW 12.6 12.7  PLT 225 212   Thyroid No results for input(s): "TSH", "FREET4" in the last 168 hours.  BNP Recent Labs  Lab 05/27/23 1954  BNP 199.1*    DDimer No results for input(s): "DDIMER" in the last 168 hours.   Radiology/Studies:  DG Chest 2 View  Result Date: 05/27/2023 CLINICAL DATA:  Chest pain EXAM: CHEST - 2 VIEW COMPARISON:  03/21/2023 FINDINGS: Hardware in the cervical spine. No acute airspace disease or effusion. Normal cardiac size. No pneumothorax IMPRESSION: No active cardiopulmonary disease. Electronically Signed   By: Jasmine Pang M.D.   On: 05/27/2023 20:09     Assessment and Plan:   Chest pain -Patient several visits to the emergency department with chest discomfort -High-sensitivity troponins trended 26-24 -No ischemic changes noted on EKG -  Last Myoview completed in 2021 revealed a low risk scan. -Potentially will be scheduled for left heart cath tomorrow pending kidney function and breathing -Continue with telemetry monitoring -EKG as needed for pain or changes -N.p.o. after midnight  Acute on chronic HFpEF -Presented with worsening shortness of breath -BNP 199.1 -Echocardiogram ordered and pending with further recommendations to follow -Continued on Jardiance 10 mg daily -Consider adding spironolactone to daily regimen -PTA diuretic on hold due to elevated kidney function -Heart failure education -Daily weights, I's and O's, low-sodium diet  Hypertensive urgency -Blood pressure 152/85 -Continued on amlodipine, carvedilol, and lisinopril -Started on hydralazine 50 mg tid -vital signs per unit protocol -Will order renal ultrasound to  rule out renal artery stenosis  AKI on CKD III -Serum creatinine on arrival 1.60, repeat this morning 1.44 -Close to baseline -Monitor urine output -Daily BMP -Monitor/trend/replete electrolytes as needed -Avoid nephrotoxic agents were able  Dyslipidemia -Continued on rosuvastatin 40 mg daily -Continue on ezetimibe 10 mg daily -Lipid panel 03/21/2023 unable to calculate LDL due to triglycerides being 536, total cholesterol 229  COPD with asthma -Not currently in exacerbation -Continued on Spiriva  Type 2 diabetes -Continued on insulin therapy -Management per IM  Informed Consent   Shared Decision Making/Informed Consent The risks [stroke (1 in 1000), death (1 in 1000), kidney failure [usually temporary] (1 in 500), bleeding (1 in 200), allergic reaction [possibly serious] (1 in 200)], benefits (diagnostic support and management of coronary artery disease) and alternatives of a cardiac catheterization were discussed in detail with Mr. Lyng and he is willing to proceed.      Risk Assessment/Risk Scores:     TIMI Risk Score for Unstable Angina or Non-ST Elevation MI:   The patient's TIMI risk score is 3, which indicates a 13% risk of all cause mortality, new or recurrent myocardial infarction or need for urgent revascularization in the next 14 days.  New York Heart Association (NYHA) Functional Class NYHA Class II        For questions or updates, please contact [facility] Please consult [email or web address] for contact info under    Signed, Renesme Kerrigan, NP  05/28/2023 2:58 PM

## 2023-05-28 NOTE — Consult Note (Addendum)
Cardiology Consultation   Patient ID: DEMARCOS Rivas MRN: [id number]; DOB: 12/21/1965  Admit date: 05/27/2023 Date of Consult: 05/28/2023  PCP:  Danelle Berry, PA-C   [facility] Providers Cardiologist:  Debbe Odea, MD        Patient Profile:   Cory Rivas is a 57 y.o. male with a hx of hypertension, LVH, chronic diastolic congestive heart failure, respiratory failure with hypoxia, hyperlipidemia, type 2 diabetes, cervical myelopathy, osteoarthritis of spine, CKD stage IIIa, idiopathic chronic gout, heart murmur, class II obesity, smoker x 35 years, who is being seen 05/28/2023 for the evaluation of chest pain and shortness of breath at the request of Cory Rivas.  History of Present Illness:   Cory Rivas has followed with cardiology since 09/2020.  He had cardiac clearance needed and a newfound heart murmur found by his PCP which required cardiovascular workup.  He underwent Lexiscan Myoview that showed no significant ischemia and no significant coronary artery calcification on attenuation correction CT images.  Echocardiogram revealed LVEF 60 to 65%, no regional wall motion abnormalities, G1 DD, no significant valvular abnormalities.  Repeat echo revealed normal LV function, no RWMA, G2 DD, mild mitral regurgitation.  He was last seen in clinic 04/12/2023 by Cory.Agbor-Etang.  At that time he noted blood pressures are still elevated at home he was still smoking and working on quitting.  Denies any chest pains or shortness of breath.  Blood pressure was found to be 174/98.  He was continued on torsemide 40 mg daily, increase Coreg to 12.5 mg twice daily continued with his lisinopril 40 mg daily, hydralazine 100 mg 3 times daily, and amlodipine 10 mg daily.  He presents to the [facility] emergency department on 05/27/2023 with reports of substernal chest pain and shortness of breath with diaphoresis after eating dinner at 5:00.  [name].  He describes chest pain as left-sided crushing pain that been present for the last 24 hours.  It caused worsening shortness of breath.  He noted no aggravating or alleviating symptoms.  He was given 3 sublingual nitro sent to the emergency department unfortunately he developed rash.  They did help with the discomfort that he was having.  On examination this morning continues to complain of a cramping type feeling that is still sore, continued shortness of breath, but without the diaphoreses.  His arms and hands are still slightly itchy.  Initial vitals: Blood pressure 210/103, pulse of 67, respiration of 20, temperature 98.5  Pertinent labs: High-sensitivity troponin 26 and 24, BNP 199.1, blood glucose 197, serum creatinine 1.60, potassium 3.7, chloride 101, CO2 26, GFR 50, CBC was unremarkable  Imaging: Chest x-ray revealed no acute cardiopulmonary disease.  Medications administered in the emergency department: Sublingual Nitrostat x 3 doses,(broke out into itching and a rash with nitro), IV Benadryl 25 mg  Cardiology was consulted this morning for evaluation of his chest pain and shortness of breath.  Past Medical History:  Diagnosis Date   Bulging of cervical intervertebral disc    CHF (congestive heart failure) (HCC)    Diabetes mellitus without complication (HCC)    Gout    High cholesterol    Hypertension    Stress due to family tension 04/15/2019    Past Surgical History:  Procedure Laterality Date   CHOLECYSTECTOMY     COLONOSCOPY WITH PROPOFOL N/A 03/10/2020   Procedure: COLONOSCOPY WITH PROPOFOL;  Surgeon: Servando Snare,  Darren, MD;  Location: [facility] ENDOSCOPY;  Service: Endoscopy;  Laterality: N/A;     Home Medications:  Prior to Admission medications   Medication Sig Start Date End Date Taking? Authorizing Provider  allopurinol (ZYLOPRIM) 300 MG tablet TAKE ONE TABLET BY MOUTH DAILY WITH 100 MG TABLET. FOR A TOTAL OF 400 MG  DAILY FOR GOUT 03/05/23  Yes Danelle Berry, PA-C  amLODipine (NORVASC) 10 MG tablet TAKE ONE TABLET BY MOUTH DAILY 01/11/23  Yes Danelle Berry, PA-C  carvedilol (COREG) 12.5 MG tablet Take 1 tablet (12.5 mg total) by mouth 2 (two) times daily with a meal. 04/12/23  Yes Agbor-Etang, Arlys John, MD  DULoxetine (CYMBALTA) 30 MG capsule TAKE ONE CAPSULE BY MOUTH TWO TIMES DAILY Patient taking differently: Take 30 mg by mouth daily as needed. 01/11/23  Yes Danelle Berry, PA-C  ezetimibe (ZETIA) 10 MG tablet TAKE ONE TABLET BY MOUTH DAILY 01/11/23  Yes Danelle Berry, PA-C  fluticasone (FLONASE) 50 MCG/ACT nasal spray Place 1 spray into both nostrils daily. Patient taking differently: Place 1 spray into both nostrils daily as needed for rhinitis or allergies. 01/23/23 01/23/24 Yes Danelle Berry, PA-C  insulin glargine (LANTUS SOLOSTAR) 100 UNIT/ML Solostar Pen Inject 20 Units into the skin at bedtime. 11/01/20  Yes [provider]  insulin lispro (HUMALOG) 100 UNIT/ML KwikPen Inject 1-45 Units into the skin 3 (three) times daily. Sliding scale up to 45 units per injection 11/08/20  Yes [provider]  ipratropium-albuterol (DUONEB) 0.5-2.5 (3) MG/3ML SOLN Take 3 mLs by nebulization every 6 (six) hours as needed (SOB wheeze cough). 07/23/22  Yes Danelle Berry, PA-C  JARDIANCE 10 MG TABS tablet TAKE ONE TABLET BY MOUTH DAILY BEFORE BREAKFAST 01/11/23  Yes Delma Freeze, FNP  levocetirizine (XYZAL) 5 MG tablet TAKE ONE TABLET BY MOUTH EVERY EVENING 01/11/23  Yes Danelle Berry, PA-C  lisinopril (ZESTRIL) 40 MG tablet Take 1 tablet (40 mg total) by mouth daily. 02/21/23 05/27/23 Yes Agbor-Etang, Arlys John, MD  metFORMIN (GLUCOPHAGE) 1000 MG tablet TAKE ONE TABLET BY MOUTH TWO TIMES A DAY6 WITHA MEAL Patient taking differently: Take 1,000 mg by mouth at bedtime. 01/11/23  Yes Danelle Berry, PA-C  pantoprazole (PROTONIX) 40 MG tablet Take 1 tablet (40 mg total) by mouth 2 (two) times daily. 03/25/23  Yes Danelle Berry, PA-C   potassium chloride SA (KLOR-CON M) 20 MEQ tablet Take 1 tablet (20 mEq total) by mouth 2 (two) times daily. Take with furosemide 01/10/23  Yes Hackney, Inetta Fermo A, FNP  rosuvastatin (CRESTOR) 40 MG tablet TAKE ONE TABLET BY MOUTH DAILY 01/11/23  Yes Danelle Berry, PA-C  Tiotropium Bromide Monohydrate (SPIRIVA RESPIMAT) 2.5 MCG/ACT AERS Inhale 2 puffs into the lungs daily. 09/24/22  Yes Dgayli, Lianne Bushy, MD  torsemide (DEMADEX) 20 MG tablet Take 20 mg by mouth daily.   Yes [provider]  TRULICITY 3 MG/0.5ML SOPN Inject 3 mg into the skin once a week. SATURDAYS 03/26/22  Yes [provider]  VENTOLIN HFA 108 (90 Base) MCG/ACT inhaler INHALE TWO PUFFS INTO THE LUNGS EVERY SIX HOURS AS NEEDED FOR WHEEZING OR SHORTNESS OF BREATH 04/26/23  Yes Angelica Chessman, Leisa, PA-C  lidocaine (LIDODERM) 5 % Place 1 patch onto the skin every 12 (twelve) hours. Remove & Discard patch within 12 hours or as directed by MD Patient not taking: Reported on 05/27/2023 03/01/23 02/29/24  Phineas Semen, MD  pregabalin (LYRICA) 50 MG capsule Take 1 capsule (50 mg total) by mouth 2 (two) times daily. 02/22/23   Danelle Berry, PA-C  torsemide (DEMADEX) 20 MG tablet Take 1 tablet (20 mg total) by mouth as needed. 04/22/23   Delma Freeze, FNP    Inpatient Medications: Scheduled Meds:  allopurinol  300 mg Oral Daily   amLODipine  10 mg Oral Daily   carvedilol  12.5 mg Oral BID WC   empagliflozin  10 mg Oral Daily   enoxaparin (LOVENOX) injection  0.5 mg/kg Subcutaneous Q24H   ezetimibe  10 mg Oral Daily   insulin glargine-yfgn  20 Units Subcutaneous QHS   lisinopril  40 mg Oral Daily   pantoprazole  40 mg Oral BID   potassium chloride SA  20 mEq Oral BID   pregabalin  50 mg Oral BID   rosuvastatin  40 mg Oral Daily   tiotropium  18 mcg Inhalation Daily   Continuous Infusions:  nitroGLYCERIN Stopped (05/27/23 2143)   PRN Meds: acetaminophen **OR** acetaminophen, albuterol, DULoxetine, fluticasone, hydrALAZINE,  ipratropium-albuterol, magnesium hydroxide, nitroGLYCERIN, ondansetron **OR** ondansetron (ZOFRAN) IV, traZODone  Allergies:    Allergies  Allergen Reactions   Oxycodone Nausea And Vomiting   Entresto [Sacubitril-Valsartan] Nausea Only and Other (See Comments)    Lightheaded and dizzy    Onion Nausea And Vomiting    Social History:   Social History   Socioeconomic History   Marital status: Divorced    Spouse name: Not on file   Number of children: 0   Years of education: 9   Highest education level: 9th grade  Occupational History   Occupation: disability  Tobacco Use   Smoking status: Some Days    Current packs/day: 0.00    Average packs/day: 1 pack/day for 37.0 years (37.0 ttl pk-yrs)    Types: Cigarettes    Start date: 10/19/1989    Last attempt to quit: 08/20/2022    Years since quitting: 0.7   Smokeless tobacco: Never   Tobacco comments:    30+ years hx smoking 2 ppd, stopped smoking briefly in April 2022 for a surgery and then restarted smoking about 1/2 ppd last 4-5 months         5-6cigs daily- 04/10/2023  Vaping Use   Vaping status: Never Used  Substance and Sexual Activity   Alcohol use: No   Drug use: No   Sexual activity: Not Currently    Partners: Female  Other Topics Concern   Not on file  Social History Narrative   Lives with sister [name], brother in law [name]), and his mother - Zackaree Callery   Social Determinants of Health   Financial Resource Strain: Medium Risk (12/06/2022)   Overall Financial Resource Strain (CARDIA)    Difficulty of Paying Living Expenses: Somewhat hard  Food Insecurity: Food Insecurity Present (05/28/2023)   Hunger Vital Sign    Worried About Running Out of Food in the Last Year: Often true    Ran Out of Food in the Last Year: Often true  Transportation Needs: No Transportation Needs (05/28/2023)   PRAPARE - Administrator, Civil Service (Medical): No    Lack of Transportation (Non-Medical): No  Physical Activity:  Insufficiently Active (12/06/2022)   Exercise Vital Sign    Days of Exercise per Week: 3 days    Minutes of Exercise per Session: 10 min  Stress: No Stress Concern Present (12/06/2022)   [employer] of Occupational Health - Occupational Stress Questionnaire    Feeling of Stress : Only a little  Social Connections: Socially Isolated (12/06/2022)   Social Connection and Isolation Panel [NHANES]  Frequency of Communication with Friends and Family: More than three times a week    Frequency of Social Gatherings with Friends and Family: Twice a week    Attends Religious Services: Never    Database administrator or Organizations: No    Attends Banker Meetings: Never    Marital Status: Divorced  Catering manager Violence: Not At Risk (05/28/2023)   Humiliation, Afraid, Rape, and Kick questionnaire    Fear of Current or Ex-Partner: No    Emotionally Abused: No    Physically Abused: No    Sexually Abused: No    Family History:    Family History  Problem Relation Age of Onset   Heart disease Mother    Diabetes Mother    Hyperlipidemia Mother    Hypertension Mother    Heart attack Mother    Lung cancer Father    Hypertension Father    Stroke Father    AAA (abdominal aortic aneurysm) Maternal Grandmother    Heart attack Maternal Grandmother    Diabetes Maternal Grandfather      ROS:  Please see the history of present illness.  Review of Systems  Constitutional:  Positive for malaise/fatigue.  Respiratory:  Positive for shortness of breath and wheezing.   Cardiovascular:  Positive for chest pain and leg swelling.  Musculoskeletal:  Positive for back pain and neck pain.  Skin:  Positive for itching and rash.    All other ROS reviewed and negative.     Physical Exam/Data:   Vitals:   05/28/23 0311 05/28/23 0913 05/28/23 1000 05/28/23 1249  BP: (!) 154/90 (!) 173/86 (!) 152/85 (!) 179/96  Pulse: 71 71 72 (!) 56  Resp: 18  16 16   Temp: 98.4 F (36.9 C) 98.3  F (36.8 C) 97.7 F (36.5 C) 97.6 F (36.4 C)  TempSrc:  Oral Oral Oral  SpO2: 95% 96% 94% 93%  Weight: 104.1 kg     Height:        Intake/Output Summary (Last 24 hours) at 05/28/2023 1458 Last data filed at 05/28/2023 1300 Gross per 24 hour  Intake 720 ml  Output --  Net 720 ml      05/28/2023    3:11 AM 05/27/2023    7:41 PM 04/22/2023    8:41 AM  Last 3 Weights  Weight (lbs) 229 lb 8 oz 230 lb 229 lb  Weight (kg) 104.101 kg 104.327 kg 103.874 kg     Body mass index is 37.04 kg/m.  General:  Well nourished, well developed, in no acute distress HEENT: normal Neck: no JVD Vascular: No carotid bruits; Distal pulses 2+ bilaterally Cardiac:  normal S1, S2; RRR; II/VI murmur  without rubs or gallops Lungs:  clear upper lobes with diminished bases to auscultation bilaterally, no wheezing, rhonchi or rales, respirations are unlabored on room air Abd: soft, nontender, no hepatomegaly  Ext: trace pretibial edema Musculoskeletal:  No deformities, BUE and BLE strength normal and equal Skin: warm and dry  Neuro:  CNs 2-12 intact, no focal abnormalities noted Psych:  Normal affect   EKG:  The EKG was personally reviewed and demonstrates:  sinus rate og 62 with ST depression and TWI in inferolateral leads Telemetry:  Telemetry was personally reviewed and demonstrates: Sinus rhythm rate of 60-70 with 1 burst of atrial tach 11 beats long  Relevant CV Studies: TTE 08/23/2022 1. Left ventricular ejection fraction, by estimation, is 60 to 65%. The  left ventricle has normal function. The left ventricle  has no regional  wall motion abnormalities. There is mild left ventricular hypertrophy.  Left ventricular diastolic parameters  are consistent with Grade II diastolic dysfunction (pseudonormalization).   2. Right ventricular systolic function is normal. The right ventricular  size is normal.   3. The mitral valve is normal in structure. Mild mitral valve  regurgitation. No evidence of  mitral stenosis.   4. The aortic valve is calcified. Aortic valve regurgitation is not  visualized. Aortic valve sclerosis/calcification is present, without any  evidence of aortic stenosis.   5. The inferior vena cava is normal in size with greater than 50%  respiratory variability, suggesting right atrial pressure of 3 mmHg.   TTE 11/04/2020 1. Left ventricular ejection fraction, by estimation, is 60 to 65%. The  left ventricle has normal function. The left ventricle has no regional  wall motion abnormalities. Left ventricular diastolic parameters are  consistent with Grade I diastolic  dysfunction (impaired relaxation).There is mild left ventricular  hypertrophy of the basal segment. LVOT gradient of 10 mm Hg at rest, 22 mm  Hg gradient with valsalva   2. Right ventricular systolic function is normal. The right ventricular  size is normal. There is normal pulmonary artery systolic pressure. The  estimated right ventricular systolic pressure is 31.6 mmHg.   3. Left atrial size was mildly dilated.   Lexiscan MPI 10/10/2020 Narrative & Impression  Low risk, probably normal pharmacologic myocardial perfusion stress tst. There is a small in size, moderate in severity, fixed defect at the apex most likely representing artifact (apical thinning and attenuation) and less likely scar. There is no evidence of significant ischemia. The left ventricular ejection fraction is normal (59%). There is no significant coronary artery calcification on the attenuation correction CT.    Laboratory Data:  High Sensitivity Troponin:   Recent Labs  Lab 05/27/23 1954 05/27/23 2126  TROPONINIHS 26* 24*     Chemistry Recent Labs  Lab 05/27/23 1954 05/28/23 0300  NA 138 138  K 3.7 3.6  CL 101 103  CO2 26 25  GLUCOSE 197* 185*  BUN 18 23*  CREATININE 1.60* 1.44*  CALCIUM 9.0 8.6*  GFRNONAA 50* 57*  ANIONGAP 11 10    Recent Labs  Lab 05/27/23 1954  PROT 7.5  ALBUMIN 3.8  AST 15  ALT 17   ALKPHOS 69  BILITOT 0.8   Lipids No results for input(s): "CHOL", "TRIG", "HDL", "LABVLDL", "LDLCALC", "CHOLHDL" in the last 168 hours.  Hematology Recent Labs  Lab 05/27/23 1954 05/28/23 0300  WBC 8.8 9.6  RBC 5.50 5.25  HGB 15.8 15.3  HCT 45.8 43.9  MCV 83.3 83.6  MCH 28.7 29.1  MCHC 34.5 34.9  RDW 12.6 12.7  PLT 225 212   Thyroid No results for input(s): "TSH", "FREET4" in the last 168 hours.  BNP Recent Labs  Lab 05/27/23 1954  BNP 199.1*    DDimer No results for input(s): "DDIMER" in the last 168 hours.   Radiology/Studies:  DG Chest 2 View  Result Date: 05/27/2023 CLINICAL DATA:  Chest pain EXAM: CHEST - 2 VIEW COMPARISON:  03/21/2023 FINDINGS: Hardware in the cervical spine. No acute airspace disease or effusion. Normal cardiac size. No pneumothorax IMPRESSION: No active cardiopulmonary disease. Electronically Signed   By: Jasmine Pang M.D.   On: 05/27/2023 20:09     Assessment and Plan:   Chest pain -Patient several visits to the emergency department with chest discomfort -High-sensitivity troponins trended 26-24 -No ischemic changes noted on EKG -  Last Myoview completed in 2021 revealed a low risk scan. -Potentially will be scheduled for left heart cath tomorrow pending kidney function and breathing -Continue with telemetry monitoring -EKG as needed for pain or changes -N.p.o. after midnight  Acute on chronic HFpEF -Presented with worsening shortness of breath -BNP 199.1 -Echocardiogram ordered and pending with further recommendations to follow -Continued on Jardiance 10 mg daily -Consider adding spironolactone to daily regimen -PTA diuretic on hold due to elevated kidney function -Heart failure education -Daily weights, I's and O's, low-sodium diet  Hypertensive urgency -Blood pressure 152/85 -Continued on amlodipine, carvedilol, and lisinopril -Started on hydralazine 50 mg tid -vital signs per unit protocol -Will order renal ultrasound to  rule out renal artery stenosis  AKI on CKD III -Serum creatinine on arrival 1.60, repeat this morning 1.44 -Close to baseline -Monitor urine output -Daily BMP -Monitor/trend/replete electrolytes as needed -Avoid nephrotoxic agents were able  Dyslipidemia -Continued on rosuvastatin 40 mg daily -Continue on ezetimibe 10 mg daily -Lipid panel 03/21/2023 unable to calculate LDL due to triglycerides being 536, total cholesterol 229  COPD with asthma -Not currently in exacerbation -Continued on Spiriva  Type 2 diabetes -Continued on insulin therapy -Management per IM  Informed Consent   Shared Decision Making/Informed Consent The risks [stroke (1 in 1000), death (1 in 1000), kidney failure [usually temporary] (1 in 500), bleeding (1 in 200), allergic reaction [possibly serious] (1 in 200)], benefits (diagnostic support and management of coronary artery disease) and alternatives of a cardiac catheterization were discussed in detail with Mr. Lyng and he is willing to proceed.      Risk Assessment/Risk Scores:     TIMI Risk Score for Unstable Angina or Non-ST Elevation MI:   The patient's TIMI risk score is 3, which indicates a 13% risk of all cause mortality, new or recurrent myocardial infarction or need for urgent revascularization in the next 14 days.  New York Heart Association (NYHA) Functional Class NYHA Class II        For questions or updates, please contact [facility] Please consult [email or web address] for contact info under    Signed,  , NP  05/28/2023 2:58 PM

## 2023-05-28 NOTE — TOC CM/SW Note (Signed)
Transition of Care [facility]) - Inpatient Brief Assessment   Patient Details  Name: Cory Rivas MRN: [id number] Date of Birth: Dec 29, 1965  [facility]) CM/SW Contact:    Margarito Liner, LCSW Phone Number: 05/28/2023, 11:29 AM   Clinical Narrative: SDOH flag for food insecurity. CSW added resources to AVS. No other TOC needs identified so far. Please place [facility] consult if any needs arise.  Transition of Care Asessment: Insurance and Status: Insurance coverage has been reviewed Patient has primary care physician: Yes Home environment has been reviewed: Single family home Prior level of function:: Not documented Prior/Current Home Services: No current home services Social Determinants of Health Reivew: SDOH reviewed interventions complete Readmission risk has been reviewed: Yes Transition of care needs: no transition of care needs at this time

## 2023-05-28 NOTE — Progress Notes (Signed)
Progress Note   Patient: Cory Rivas ZOX:[id number] DOB: Feb 27, 1966 DOA: 05/27/2023     0 DOS: the patient was seen and examined on 05/28/2023   Brief hospital course: Taken from H&P.  Cory Rivas is a 57 y.o. Caucasian male with medical history significant for CHF, type 2 diabetes mellitus, gout, hypertension COPD, asthma and dyslipidemia, who presented to the emergency room with acute onset of chest pain with associated dyspnea and diaphoresis with no radiation.   On presentation patient had significantly elevated blood pressure at 204/102, labs with creatinine of 1.6 with baseline of 1.2-1.4.  BNP 199.  Troponin 26>>24. EKG NSR with T wave inversions laterally. Chest x-ray was negative for any acute cardiopulmonary disease.  Admitted for concern of hypertensive urgency.  8/13: Blood pressure improved to 154/90.  Creatinine improving at 1.44.  Holding home torsemide, cardiology is planning to do IV diuresis if renal function allows.  Repeat echocardiogram done-pending results.  Likely will need cardiac catheterization during current hospitalization per cardiology.  Restarted home lisinopril    Assessment and Plan: * Hypertensive urgency Blood pressure seems improving but still elevated. -Restarting home lisinopril as renal function with some improvement. -Continue with IV hydralazine as needed  Chest pain Likely secondary to hypertensive urgency as his blood pressure was significantly elevated.  Troponin barely positive with a flat curve. Cardiology is on board Echocardiogram done-pending results Likely will need cardiac catheterization before discharge per cardiology  Acute on chronic diastolic congestive heart failure (HCC) BNP mildly elevated at 199.  Prior echocardiogram with diastolic dysfunction. Repeat echocardiogram done-pending results Cardiology would like to do some IV diuresis if blood pressure allows Currently holding home torsemide  AKI (acute kidney injury)  (HCC) History of underlying CKD stage III a, renal function started improving, close to baseline now. - We will hold off nephrotoxins. - Will follow BMPs.  Type 2 diabetes mellitus with peripheral neuropathy (HCC) - He will be placed on supplemental coverage with NovoLog. - We will continue basal coverage. - We will hold off metformin and continue Jardiance. - We will continue Lyrica.  Dyslipidemia - We will continue statin therapy and Zetia.  COPD with asthma No concern of exacerbation at this time. -Continue home bronchodilator  GERD without esophagitis - We will continue PPI therapy.  Gout - We will continue allopurinol.  Class 2 severe obesity with serious comorbidity and body mass index (BMI) of 39.0 to 39.9 in adult [facility]) Estimated body mass index is 37.04 kg/m as calculated from the following:   Height as of this encounter: 5\' 6"  (1.676 m).   Weight as of this encounter: 104.1 kg.   -This will complicate overall prognosis -Encouraged weight loss   Subjective: Patient was seen and examined today.  No chest pain or shortness of breath.  Per patient he is compliant with his medications but his blood pressure go significantly high at times.  Physical Exam: Vitals:   05/28/23 0311 05/28/23 0913 05/28/23 1000 05/28/23 1249  BP: (!) 154/90 (!) 173/86 (!) 152/85 (!) 179/96  Pulse: 71 71 72 (!) 56  Resp: 18  16 16   Temp: 98.4 F (36.9 C) 98.3 F (36.8 C) 97.7 F (36.5 C) 97.6 F (36.4 C)  TempSrc:  Oral Oral Oral  SpO2: 95% 96% 94% 93%  Weight: 104.1 kg     Height:       General.  Obese gentleman, in no acute distress. Pulmonary.  Lungs clear bilaterally, normal respiratory effort. CV.  Regular rate and  rhythm, no JVD, rub or murmur. Abdomen.  Soft, nontender, nondistended, BS positive. CNS.  Alert and oriented .  No focal neurologic deficit. Extremities.  No edema, no cyanosis, pulses intact and symmetrical. Psychiatry.  Judgment and insight appears normal.    Data Reviewed: Prior data reviewed  Family Communication: Discussed with patient  Disposition: Status is: Observation The patient will require care spanning > 2 midnights and should be moved to inpatient because: Severity of illness  Planned Discharge Destination: Home  Time spent: 45 minutes  This record has been created using Conservation officer, historic buildings. Errors have been sought and corrected,but may not always be located. Such creation errors do not reflect on the standard of care.   Author: Arnetha Courser, MD 05/28/2023 2:54 PM  For on call review [email or web address].

## 2023-05-28 NOTE — Assessment & Plan Note (Signed)
BNP mildly elevated at 199.  Prior echocardiogram with diastolic dysfunction. Repeat echocardiogram done-pending results Cardiology would like to do some IV diuresis if blood pressure allows Currently holding home torsemide

## 2023-05-29 ENCOUNTER — Other Ambulatory Visit: Payer: Self-pay

## 2023-05-29 ENCOUNTER — Encounter
Admission: EM | Disposition: A | Discharge status: Home / Self Care | Payer: Self-pay | Source: Home / Self Care | Attending: Emergency Medicine

## 2023-05-29 DIAGNOSIS — I200 Unstable angina: Secondary | ICD-10-CM

## 2023-05-29 DIAGNOSIS — G4733 Obstructive sleep apnea (adult) (pediatric): Secondary | ICD-10-CM | POA: Diagnosis not present

## 2023-05-29 DIAGNOSIS — I25110 Atherosclerotic heart disease of native coronary artery with unstable angina pectoris: Secondary | ICD-10-CM

## 2023-05-29 DIAGNOSIS — I160 Hypertensive urgency: Secondary | ICD-10-CM | POA: Diagnosis not present

## 2023-05-29 DIAGNOSIS — I10 Essential (primary) hypertension: Secondary | ICD-10-CM | POA: Diagnosis not present

## 2023-05-29 DIAGNOSIS — I5033 Acute on chronic diastolic (congestive) heart failure: Secondary | ICD-10-CM | POA: Diagnosis not present

## 2023-05-29 HISTORY — PX: RIGHT/LEFT HEART CATH AND CORONARY ANGIOGRAPHY: CATH118266

## 2023-05-29 HISTORY — PX: CORONARY PRESSURE/FFR STUDY: CATH118243

## 2023-05-29 LAB — POCT I-STAT 7, (LYTES, BLD GAS, ICA,H+H)
Acid-base deficit: 1 mmol/L (ref 0.0–2.0)
Bicarbonate: 25.1 mmol/L (ref 20.0–28.0)
Calcium, Ion: 1.16 mmol/L (ref 1.15–1.40)
HCT: 45 % (ref 39.0–52.0)
Hemoglobin: 15.3 g/dL (ref 13.0–17.0)
O2 Saturation: 91 %
Potassium: 4 mmol/L (ref 3.5–5.1)
Sodium: 141 mmol/L (ref 135–145)
TCO2: 26 mmol/L (ref 22–32)
pCO2 arterial: 45.4 mmHg (ref 32–48)
pH, Arterial: 7.351 (ref 7.35–7.45)
pO2, Arterial: 63 mmHg — ABNORMAL LOW (ref 83–108)

## 2023-05-29 LAB — GLUCOSE, CAPILLARY
Glucose-Capillary: 136 mg/dL — ABNORMAL HIGH (ref 70–99)
Glucose-Capillary: 110 mg/dL — ABNORMAL HIGH (ref 70–99)
Glucose-Capillary: 110 mg/dL — ABNORMAL HIGH (ref 70–99)
Glucose-Capillary: 154 mg/dL — ABNORMAL HIGH (ref 70–99)
Glucose-Capillary: 194 mg/dL — ABNORMAL HIGH (ref 70–99)

## 2023-05-29 LAB — POCT I-STAT EG7
Acid-Base Excess: 0 mmol/L (ref 0.0–2.0)
Bicarbonate: 26.4 mmol/L (ref 20.0–28.0)
Calcium, Ion: 1.15 mmol/L (ref 1.15–1.40)
HCT: 44 % (ref 39.0–52.0)
Hemoglobin: 15 g/dL (ref 13.0–17.0)
O2 Saturation: 64 %
Potassium: 3.9 mmol/L (ref 3.5–5.1)
Sodium: 141 mmol/L (ref 135–145)
TCO2: 28 mmol/L (ref 22–32)
pCO2, Ven: 49 mmHg (ref 44–60)
pH, Ven: 7.34 (ref 7.25–7.43)
pO2, Ven: 36 mmHg (ref 32–45)

## 2023-05-29 LAB — POCT ACTIVATED CLOTTING TIME: Activated Clotting Time: 287 seconds

## 2023-05-29 SURGERY — RIGHT/LEFT HEART CATH AND CORONARY ANGIOGRAPHY
Anesthesia: Moderate Sedation

## 2023-05-29 MED ORDER — FENTANYL CITRATE (PF) 100 MCG/2ML IJ SOLN
INTRAMUSCULAR | Status: DC | PRN
  Administered 2023-05-29: 25 ug via INTRAVENOUS

## 2023-05-29 MED ORDER — MIDAZOLAM HCL 2 MG/2ML IJ SOLN
INTRAMUSCULAR | Status: AC
  Filled 2023-05-29: qty 2

## 2023-05-29 MED ORDER — HEPARIN SODIUM (PORCINE) 1000 UNIT/ML IJ SOLN
INTRAMUSCULAR | Status: AC
  Filled 2023-05-29: qty 10

## 2023-05-29 MED ORDER — HEPARIN SODIUM (PORCINE) 1000 UNIT/ML IJ SOLN
INTRAMUSCULAR | Status: DC | PRN
  Administered 2023-05-29 (×2): 5000 [IU] via INTRAVENOUS

## 2023-05-29 MED ORDER — VERAPAMIL HCL 2.5 MG/ML IV SOLN
INTRAVENOUS | Status: DC | PRN
  Administered 2023-05-29: 2.5 mg via INTRA_ARTERIAL

## 2023-05-29 MED ORDER — HEPARIN (PORCINE) IN NACL 1000-0.9 UT/500ML-% IV SOLN
INTRAVENOUS | Status: AC
  Filled 2023-05-29: qty 1000

## 2023-05-29 MED ORDER — HYDRALAZINE HCL 20 MG/ML IJ SOLN: 10.0000 mg | INTRAMUSCULAR | Status: AC | PRN

## 2023-05-29 MED ORDER — INSULIN ASPART 100 UNIT/ML IJ SOLN
0.0000 [IU] | Freq: Three times a day (TID) | INTRAMUSCULAR | Status: DC
  Administered 2023-05-29 – 2023-05-30 (×3): 2 [IU] via SUBCUTANEOUS
  Filled 2023-05-29 (×3): qty 1

## 2023-05-29 MED ORDER — SODIUM CHLORIDE 0.9% FLUSH
3.0000 mL | Freq: Two times a day (BID) | INTRAVENOUS | Status: DC
  Administered 2023-05-29 – 2023-05-30 (×2): 3 mL via INTRAVENOUS

## 2023-05-29 MED ORDER — LIDOCAINE HCL (PF) 1 % IJ SOLN
INTRAMUSCULAR | Status: DC | PRN
  Administered 2023-05-29: 2 mL
  Administered 2023-05-29: 5 mL

## 2023-05-29 MED ORDER — CARVEDILOL 6.25 MG PO TABS
6.2500 mg | ORAL_TABLET | Freq: Two times a day (BID) | ORAL | Status: DC
  Administered 2023-05-29 – 2023-05-30 (×2): 6.25 mg via ORAL
  Filled 2023-05-29 (×2): qty 1

## 2023-05-29 MED ORDER — ENOXAPARIN SODIUM 60 MG/0.6ML IJ SOSY
50.0000 mg | PREFILLED_SYRINGE | INTRAMUSCULAR | Status: DC
  Administered 2023-05-30: 50 mg via SUBCUTANEOUS
  Filled 2023-05-29: qty 0.6

## 2023-05-29 MED ORDER — LIDOCAINE HCL 1 % IJ SOLN
INTRAMUSCULAR | Status: AC
  Filled 2023-05-29: qty 20

## 2023-05-29 MED ORDER — IOHEXOL 300 MG/ML  SOLN
INTRAMUSCULAR | Status: DC | PRN
  Administered 2023-05-29: 58 mL

## 2023-05-29 MED ORDER — FENTANYL CITRATE (PF) 100 MCG/2ML IJ SOLN
INTRAMUSCULAR | Status: AC
  Filled 2023-05-29: qty 2

## 2023-05-29 MED ORDER — HEPARIN (PORCINE) IN NACL 1000-0.9 UT/500ML-% IV SOLN
INTRAVENOUS | Status: DC | PRN
  Administered 2023-05-29 (×2): 500 mL

## 2023-05-29 MED ORDER — VERAPAMIL HCL 2.5 MG/ML IV SOLN
INTRAVENOUS | Status: AC
  Filled 2023-05-29: qty 2

## 2023-05-29 MED ORDER — INSULIN ASPART 100 UNIT/ML IJ SOLN: 0.0000 [IU] | Freq: Every day | INTRAMUSCULAR | Status: DC

## 2023-05-29 MED ORDER — MIDAZOLAM HCL 2 MG/2ML IJ SOLN
INTRAMUSCULAR | Status: DC | PRN
  Administered 2023-05-29: 1 mg via INTRAVENOUS

## 2023-05-29 MED ORDER — SODIUM CHLORIDE 0.9% FLUSH: 3.0000 mL | INTRAVENOUS | Status: DC | PRN

## 2023-05-29 MED ORDER — SODIUM CHLORIDE 0.9 % IV SOLN: INTRAVENOUS | Status: AC

## 2023-05-29 MED ORDER — SODIUM CHLORIDE 0.9 % IV SOLN: 250.0000 mL | INTRAVENOUS | Status: DC | PRN

## 2023-05-29 SURGICAL SUPPLY — 16 items
CATH 5FR JL3.5 JR4 ANG PIG MP (CATHETERS) IMPLANT
CATH BALLN WEDGE 5F 110CM (CATHETERS) IMPLANT
CATH LAUNCHER 6FR EBU3.5 (CATHETERS) IMPLANT
DEVICE RAD TR BAND REGULAR (VASCULAR PRODUCTS) IMPLANT
DRAPE BRACHIAL (DRAPES) IMPLANT
GLIDESHEATH SLEND SS 6F .021 (SHEATH) IMPLANT
GUIDEWIRE .025 260CM (WIRE) IMPLANT
GUIDEWIRE INQWIRE 1.5J.035X260 (WIRE) IMPLANT
GUIDEWIRE PRESSURE X 175 (WIRE) IMPLANT
INQWIRE 1.5J .035X260CM (WIRE) ×1
KIT ENCORE 26 ADVANTAGE (KITS) IMPLANT
PACK CARDIAC CATH (CUSTOM PROCEDURE TRAY) ×2 IMPLANT
PROTECTION STATION PRESSURIZED (MISCELLANEOUS) ×1
SET ATX-X65L (MISCELLANEOUS) IMPLANT
SHEATH GLIDE SLENDER 4/5FR (SHEATH) IMPLANT
STATION PROTECTION PRESSURIZED (MISCELLANEOUS) IMPLANT

## 2023-05-29 NOTE — Inpatient Diabetes Management (Signed)
Inpatient Diabetes Program Recommendations  AACE/ADA: New Consensus Statement on Inpatient Glycemic Control (2015)  Target Ranges:  Prepandial:   less than 140 mg/dL      Peak postprandial:   less than 180 mg/dL (1-2 hours)      Critically ill patients:  140 - 180 mg/dL   Lab Results  Component Value Date   GLUCAP 110 (H) 05/29/2023   HGBA1C 10.2 (H) 03/21/2023    Review of Glycemic Control  Latest Reference Range & Units 05/27/23 19:49 05/28/23 18:36 05/28/23 20:33 05/29/23 09:25 05/29/23 11:40  Glucose-Capillary 70 - 99 mg/dL 782 (H) 956 (H) 213 (H) 136 (H) 110 (H)   Diabetes history: DM 2 Outpatient Diabetes medications:  Lantus 20 units q HS, Humalog 8 units tid with meals, Jardiance 10 mg daily, Metformin 1000 mg q HS, Trulicity 3 mg daily Current orders for Inpatient glycemic control:  Novolog 0-20 units tid with meals and HS Novolog 4 units tid with meals and HS Jardiance 10 mg daily Semglee 20 units daily  Inpatient Diabetes Program Recommendations:    Consider reducing Novolog correction to sensitive (0-9 units) tid with meals.   Thanks,  Beryl Meager, RN, BC-ADM Inpatient Diabetes Coordinator Pager [phone]  (8a-5p)

## 2023-05-29 NOTE — Plan of Care (Signed)

## 2023-05-29 NOTE — Progress Notes (Addendum)
Progress Note    Cory Rivas  YQM:[id number] DOB: [birth year]  DOA: 05/27/2023 PCP: Danelle Berry, PA-C      Brief Narrative:    Medical records reviewed and are as summarized below:  Cory Rivas is a 57 y.o. male  with medical history significant for CHF, type 2 diabetes mellitus, gout, hypertension COPD, asthma, CKD stage III, dyslipidemia, who presented to the emergency room with acute onset of chest pain, shortness of breath and diaphoresis.  His blood pressure was significantly elevated to 204/102 on admission.   Troponins were mildly elevated but flat.  He was admitted to the hospital for hypertensive urgency, chest pain, acute on chronic diastolic CHF and concern for unstable angina.         Assessment/Plan:   Principal Problem:   Hypertensive urgency Active Problems:   Acute on chronic diastolic congestive heart failure (HCC)   Chest pain   Type 2 diabetes mellitus with peripheral neuropathy (HCC)   Class 2 severe obesity with serious comorbidity and body mass index (BMI) of 39.0 to 39.9 in adult [facility])   Gout   GERD without esophagitis   COPD with asthma   Dyslipidemia    Body mass index is 37.04 kg/m.  (Obesity)    Chest pain, mildly elevated troponins: Plan for left heart cath today.  Follow-up with cardiologist for further recommendations.   Hypertensive urgency: BP has improved.  Continue antihypertensives   Acute on chronic diastolic CHF: He is asymptomatic at this time.  2D echo showed EF estimated at 50 to 55%, grade 3 diastolic dysfunction, moderate LVH   CKD stage IIIa: Creatinine appears to be at baseline.  No AKI   Other comorbidities include type II DM, peripheral neuropathy, COPD, asthma, dyslipidemia, GERD, gout   Diet Order             Diet NPO time specified Except for: Sips with Meds  Diet effective midnight                            Consultants: Cardiologist  Procedures: Plan for left heart  cath    Medications:    [MAR Hold] allopurinol  300 mg Oral Daily   [MAR Hold] amLODipine  10 mg Oral Daily   [MAR Hold] aspirin EC  81 mg Oral Daily   [MAR Hold] carvedilol  12.5 mg Oral BID WC   [MAR Hold] empagliflozin  10 mg Oral Daily   [MAR Hold] enoxaparin (LOVENOX) injection  0.5 mg/kg Subcutaneous Q24H   [MAR Hold] ezetimibe  10 mg Oral Daily   insulin aspart  0-5 Units Subcutaneous QHS   insulin aspart  0-9 Units Subcutaneous TID WC   [MAR Hold] insulin aspart  4 Units Subcutaneous TID WC   [MAR Hold] insulin glargine-yfgn  20 Units Subcutaneous QHS   [MAR Hold] lisinopril  40 mg Oral Daily   [MAR Hold] pantoprazole  40 mg Oral BID   [MAR Hold] potassium chloride SA  20 mEq Oral BID   [MAR Hold] pregabalin  50 mg Oral BID   [MAR Hold] rosuvastatin  40 mg Oral Daily   [MAR Hold] tiotropium  18 mcg Inhalation Daily   Continuous Infusions:  sodium chloride 10 mL/hr at 05/29/23 0631   nitroGLYCERIN Stopped (05/27/23 2143)     Anti-infectives (From admission, onward)    None              Family Communication/Anticipated  D/C date and plan/Code Status   DVT prophylaxis:      Code Status: Full Code  Family Communication: Plan discussed with his sister at the bedside Disposition Plan: Plan to discharge home tomorrow   Status is: Observation The patient will require care spanning > 2 midnights and should be moved to inpatient because: Cardiac cath for chest pain.       Subjective:   Interval events noted.  He feels better today.  Minimal midsternal chest pain.  No shortness of breath.  His sister was at the bedside.  Objective:    Vitals:   05/29/23 0353 05/29/23 0923 05/29/23 1139 05/29/23 1344  BP: 128/74 (!) 151/90 138/82 128/86  Pulse: (!) 55 (!) 53 (!) 50 (!) 51  Resp: 18 16 16 17   Temp: 97.8 F (36.6 C) 97.7 F (36.5 C) 97.7 F (36.5 C) 97.9 F (36.6 C)  TempSrc:  Oral Oral Oral  SpO2: 93% 96% 96% 94%  Weight:      Height:        No data found.   Intake/Output Summary (Last 24 hours) at 05/29/2023 1413 Last data filed at 05/29/2023 0830 Gross per 24 hour  Intake 580 ml  Output --  Net 580 ml   Filed Weights   05/27/23 1941 05/28/23 0311  Weight: 104.3 kg 104.1 kg    Exam:  GEN: NAD SKIN: No rash.  Chronic lump on the left upper back EYES: EOMI ENT: MMM CV: RRR PULM: CTA B ABD: soft, obese, NT, +BS CNS: AAO x 3, non focal EXT: No edema or tenderness        Data Reviewed:   I have personally reviewed following labs and imaging studies:  Labs: Labs show the following:   Basic Metabolic Panel: Recent Labs  Lab 05/27/23 1954 05/28/23 0300 05/29/23 0538  NA 138 138 139  K 3.7 3.6 4.1  CL 101 103 106  CO2 26 25 25   GLUCOSE 197* 185* 169*  BUN 18 23* 22*  CREATININE 1.60* 1.44* 1.46*  CALCIUM 9.0 8.6* 8.7*   GFR Estimated Creatinine Clearance: 63.8 mL/min (A) (by C-G formula based on SCr of 1.46 mg/dL (H)). Liver Function Tests: Recent Labs  Lab 05/27/23 1954  AST 15  ALT 17  ALKPHOS 69  BILITOT 0.8  PROT 7.5  ALBUMIN 3.8   No results for input(s): "LIPASE", "AMYLASE" in the last 168 hours. No results for input(s): "AMMONIA" in the last 168 hours. Coagulation profile Recent Labs  Lab 05/29/23 0538  INR 1.1    CBC: Recent Labs  Lab 05/27/23 1954 05/28/23 0300 05/29/23 0538  WBC 8.8 9.6 10.0  HGB 15.8 15.3 16.0  HCT 45.8 43.9 47.6  MCV 83.3 83.6 85.6  PLT 225 212 221   Cardiac Enzymes: No results for input(s): "CKTOTAL", "CKMB", "CKMBINDEX", "TROPONINI" in the last 168 hours. BNP (last 3 results) No results for input(s): "PROBNP" in the last 8760 hours. CBG: Recent Labs  Lab 05/28/23 1836 05/28/23 2033 05/29/23 0925 05/29/23 1140 05/29/23 1357  GLUCAP 252* 242* 136* 110* 110*   D-Dimer: No results for input(s): "DDIMER" in the last 72 hours. Hgb A1c: No results for input(s): "HGBA1C" in the last 72 hours. Lipid Profile: No results for input(s):  "CHOL", "HDL", "LDLCALC", "TRIG", "CHOLHDL", "LDLDIRECT" in the last 72 hours. Thyroid function studies: No results for input(s): "TSH", "T4TOTAL", "T3FREE", "THYROIDAB" in the last 72 hours.  Invalid input(s): "FREET3" Anemia work up: No results for input(s): "VITAMINB12", "FOLATE", "FERRITIN", "TIBC", "  IRON", "RETICCTPCT" in the last 72 hours. Sepsis Labs: Recent Labs  Lab 05/27/23 1954 05/28/23 0300 05/29/23 0538  WBC 8.8 9.6 10.0    Microbiology No results found for this or any previous visit (from the past 240 hour(s)).  Procedures and diagnostic studies:  ECHOCARDIOGRAM COMPLETE  Result Date: 05/28/2023    ECHOCARDIOGRAM REPORT   Patient Name:   SANJIT SCIANDRA Date of Exam: 05/28/2023 Medical Rec #:  [id number]       Height:       66.0 in Accession #:    [id number]      Weight:       229.5 lb Date of Birth:  Apr 07, 1966        BSA:          2.120 m Patient Age:    57 years        BP:           152/85 mmHg Patient Gender: M               HR:           60 bpm. Exam Location:  [facility] Procedure: 2D Echo, Cardiac Doppler, Color Doppler and Strain Analysis Indications:     Chest Pain  History:         Patient has prior history of Echocardiogram examinations, most                  recent 08/23/2022. CHF, COPD, Signs/Symptoms:Chest Pain and                  Murmur; Risk Factors:Hypertension, Diabetes and Former Smoker.                  CKD.  Sonographer:     Mikki Harbor Referring Phys:  [id number] SHERI HAMMOCK Diagnosing Phys: Yvonne Kendall MD  Sonographer Comments: Patient is obese. Image acquisition challenging due to respiratory motion. Global longitudinal strain was attempted. IMPRESSIONS  1. Left ventricular ejection fraction, by estimation, is 50 to 55%. The left ventricle has low normal function. The left ventricle has no regional wall motion abnormalities. There is moderate left ventricular hypertrophy. Left ventricular diastolic parameters are consistent with Grade III diastolic  dysfunction (restrictive). Elevated left atrial pressure. The average left ventricular global longitudinal strain is -10.0 %. The global longitudinal strain is abnormal.  2. Right ventricular systolic function is normal. The right ventricular size is normal. Tricuspid regurgitation signal is inadequate for assessing PA pressure.  3. Left atrial size was mildly dilated.  4. The mitral valve was not well visualized. Mild mitral valve regurgitation.  5. The aortic valve has an indeterminant number of cusps. There is moderate calcification of the aortic valve. There is moderate thickening of the aortic valve. Aortic valve regurgitation is not visualized. Aortic valve sclerosis/calcification is present, without any evidence of aortic stenosis.  6. The inferior vena cava is normal in size with greater than 50% respiratory variability, suggesting right atrial pressure of 3 mmHg. FINDINGS  Left Ventricle: Left ventricular ejection fraction, by estimation, is 50 to 55%. The left ventricle has low normal function. The left ventricle has no regional wall motion abnormalities. The average left ventricular global longitudinal strain is -10.0 %. The global longitudinal strain is abnormal. The left ventricular internal cavity size was normal in size. There is moderate left ventricular hypertrophy. Left ventricular diastolic parameters are consistent with Grade III diastolic dysfunction (restrictive). Elevated left atrial pressure. Right Ventricle: The right ventricular size is normal.  No increase in right ventricular wall thickness. Right ventricular systolic function is normal. Tricuspid regurgitation signal is inadequate for assessing PA pressure. Left Atrium: Left atrial size was mildly dilated. Right Atrium: Right atrial size was normal in size. Pericardium: There is no evidence of pericardial effusion. Mitral Valve: The mitral valve was not well visualized. Mild mitral valve regurgitation. MV peak gradient, 10.1 mmHg. The  mean mitral valve gradient is 2.0 mmHg. Tricuspid Valve: The tricuspid valve is grossly normal. Tricuspid valve regurgitation is trivial. Aortic Valve: The aortic valve has an indeterminant number of cusps. There is moderate calcification of the aortic valve. There is moderate thickening of the aortic valve. Aortic valve regurgitation is not visualized. Aortic valve sclerosis/calcification  is present, without any evidence of aortic stenosis. Aortic valve mean gradient measures 7.3 mmHg. Aortic valve peak gradient measures 14.5 mmHg. Aortic valve area, by VTI measures 2.70 cm. Pulmonic Valve: The pulmonic valve was not well visualized. Pulmonic valve regurgitation is not visualized. No evidence of pulmonic stenosis. Aorta: The aortic root and ascending aorta are structurally normal, with no evidence of dilitation. Pulmonary Artery: The pulmonary artery is not well seen. Venous: The inferior vena cava is normal in size with greater than 50% respiratory variability, suggesting right atrial pressure of 3 mmHg. IAS/Shunts: No atrial level shunt detected by color flow Doppler.  LEFT VENTRICLE PLAX 2D LVIDd:         5.30 cm      Diastology LVIDs:         4.15 cm      LV e' medial:    5.55 cm/s LV PW:         1.50 cm      LV E/e' medial:  23.1 LV IVS:        1.70 cm      LV e' lateral:   4.24 cm/s LVOT diam:     2.00 cm      LV E/e' lateral: 30.2 LV SV:         114 LV SV Index:   54           2D Longitudinal Strain LVOT Area:     3.14 cm     2D Strain GLS Avg:     -10.0 %  LV Volumes (MOD) LV vol d, MOD A2C: 90.7 ml LV vol d, MOD A4C: 103.0 ml LV vol s, MOD A2C: 36.2 ml LV vol s, MOD A4C: 45.4 ml LV SV MOD A2C:     54.5 ml LV SV MOD A4C:     103.0 ml LV SV MOD BP:      56.9 ml RIGHT VENTRICLE RV Basal diam:  3.65 cm RV Mid diam:    3.60 cm RV S prime:     11.60 cm/s TAPSE (M-mode): 2.9 cm LEFT ATRIUM             Index        RIGHT ATRIUM           Index LA diam:        4.30 cm 2.03 cm/m   RA Area:     18.20 cm LA Vol  (A2C):   55.2 ml 26.03 ml/m  RA Volume:   48.50 ml  22.87 ml/m LA Vol (A4C):   58.5 ml 27.59 ml/m LA Biplane Vol: 57.3 ml 27.03 ml/m  AORTIC VALVE                     PULMONIC  VALVE AV Area (Vmax):    2.74 cm      PV Vmax:       1.36 m/s AV Area (Vmean):   2.72 cm      PV Peak grad:  7.4 mmHg AV Area (VTI):     2.70 cm AV Vmax:           190.67 cm/s AV Vmean:          123.667 cm/s AV VTI:            0.423 m AV Peak Grad:      14.5 mmHg AV Mean Grad:      7.3 mmHg LVOT Vmax:         166.50 cm/s LVOT Vmean:        107.050 cm/s LVOT VTI:          0.364 m LVOT/AV VTI ratio: 0.86  AORTA Ao Root diam: 3.40 cm Ao Asc diam:  3.40 cm MITRAL VALVE MV Area (PHT): 3.17 cm     SHUNTS MV Area VTI:   2.47 cm     Systemic VTI:  0.36 m MV Peak grad:  10.1 mmHg    Systemic Diam: 2.00 cm MV Mean grad:  2.0 mmHg MV Vmax:       1.59 m/s MV Vmean:      67.3 cm/s MV Decel Time: 239 msec MV E velocity: 128.00 cm/s MV A velocity: 51.40 cm/s MV E/A ratio:  2.49 Cristal De[place] End MD Electronically signed by Yvonne Kendall MD Signature Date/Time: 05/28/2023/3:00:09 PM    Final    DG Chest 2 View  Result Date: 05/27/2023 CLINICAL DATA:  Chest pain EXAM: CHEST - 2 VIEW COMPARISON:  03/21/2023 FINDINGS: Hardware in the cervical spine. No acute airspace disease or effusion. Normal cardiac size. No pneumothorax IMPRESSION: No active cardiopulmonary disease. Electronically Signed   By: Jasmine Pang M.D.   On: 05/27/2023 20:09               LOS: 0 days      [facility]   Pager on [email or web address]. If 7PM-7AM, please contact night-coverage at [email or web address]     05/29/2023, 2:13 PM

## 2023-05-29 NOTE — Progress Notes (Signed)
   Patient Name: Cory Rivas Date of Encounter: 05/29/2023 [facility] Cardiologist: Debbe Odea, MD   Interval Summary  .    Cory Rivas is feeling much better today with resolution of chest pain and improvement in shortness of breath.  No palpitations.  Vital Signs .    Vitals:   05/29/23 1606 05/29/23 1615 05/29/23 1630 05/29/23 1645  BP: 139/85 136/72 138/72 130/86  Pulse: (!) 55 60 (!) 58 (!) 51  Resp: 18 (!) 23 20 20   Temp:      TempSrc:      SpO2: 90% 92% 93% 93%  Weight:      Height:        Intake/Output Summary (Last 24 hours) at 05/29/2023 1649 Last data filed at 05/29/2023 0830 Gross per 24 hour  Intake 580 ml  Output --  Net 580 ml      05/28/2023    3:11 AM 05/27/2023    7:41 PM 04/22/2023    8:41 AM  Last 3 Weights  Weight (lbs) 229 lb 8 oz 230 lb 229 lb  Weight (kg) 104.101 kg 104.327 kg 103.874 kg      Telemetry/ECG    Normal sinus rhythm and sinus bradycardia- Personally Reviewed  Physical Exam .   GEN: No acute distress.   Neck: Unable to assess JVP due to body habitus. Cardiac: Bradycardic but regular with 1/6 systolic murmur. Respiratory: Clear to auscultation bilaterally. GI: Soft, nontender, non-distended  MS: Trace pretibial edema  Assessment & Plan .     Acute on chronic HFpEF: Patient's echo shows normal LVEF with grade 3 diastolic dysfunction.  Right and left heart catheterization today shows upper normal to mildly elevated left and right heart filling pressures as well as mild pulmonary hypertension.  Cardiac output/index are borderline low.  Findings are consistent with HFpEF. -Resume gentle diuresis tomorrow if renal function remains stable. -Decrease carvedilol in the setting of sinus bradycardia; chronotropic incompetence/suppression can worsen symptoms of HFpEF. -Continue empagliflozin.  Could consider transitioning lisinopril to Entresto at some point and/or adding spironolactone if renal function and potassium  allow. -Per the patient's brother-in-law, Cory Rivas drinks only soda and eats fried/processed foods.  He will need continuing education regarding lifestyle modifications to help minimize risk for recurrent CHF exacerbations.  Nonobstructive coronary artery disease: Chest pain on admission most likely due to uncontrolled hypertension and HFpEF.  Cath today did not show hemodynamically significant disease. -Continue aspirin and statin therapy.  Disposition: If symptoms remain absent and renal function is stable, anticipate discharge home tomorrow.  For questions or updates, please contact [facility] Please consult [email or web address] for contact info under [facility].     Signed, Yvonne Kendall, MD

## 2023-05-29 NOTE — Interval H&P Note (Signed)
History and Physical Interval Note:  05/29/2023 2:41 PM  Cory Rivas  has presented today for surgery, with the diagnosis of chest pain and acute on chronic diastolic heart failure and unstable angina.  The various methods of treatment have been discussed with the patient and family. After consideration of risks, benefits and other options for treatment, the patient has consented to  Procedure(s): RIGHT/LEFT HEART CATH AND CORONARY ANGIOGRAPHY (N/A) as a surgical intervention.  The patient's history has been reviewed, patient examined, no change in status, stable for surgery.  I have reviewed the patient's chart and labs.  Questions were answered to the patient's satisfaction.    Cath Lab Visit (complete for each Cath Lab visit)  Clinical Evaluation Leading to the Procedure:   ACS: Yes.    Non-ACS:  N/A   

## 2023-05-29 NOTE — Progress Notes (Signed)
Patient prepped for heart cath. Clipped right radial and bilateral groins. CHG complete. 2nd IV placed and NS infusing at 10 mL/hr. Consent obtained and patient verbalizes understanding

## 2023-05-30 ENCOUNTER — Encounter: Payer: Self-pay | Admitting: Internal Medicine

## 2023-05-30 DIAGNOSIS — R7989 Other specified abnormal findings of blood chemistry: Secondary | ICD-10-CM

## 2023-05-30 DIAGNOSIS — I5033 Acute on chronic diastolic (congestive) heart failure: Secondary | ICD-10-CM

## 2023-05-30 DIAGNOSIS — J4489 Other specified chronic obstructive pulmonary disease: Secondary | ICD-10-CM | POA: Diagnosis not present

## 2023-05-30 DIAGNOSIS — E1142 Type 2 diabetes mellitus with diabetic polyneuropathy: Secondary | ICD-10-CM | POA: Diagnosis not present

## 2023-05-30 DIAGNOSIS — I160 Hypertensive urgency: Secondary | ICD-10-CM | POA: Diagnosis not present

## 2023-05-30 DIAGNOSIS — R079 Chest pain, unspecified: Secondary | ICD-10-CM | POA: Diagnosis not present

## 2023-05-30 LAB — BASIC METABOLIC PANEL
Anion gap: 9 (ref 5–15)
BUN: 21 mg/dL — ABNORMAL HIGH (ref 6–20)
CO2: 24 mmol/L (ref 22–32)
Calcium: 8.3 mg/dL — ABNORMAL LOW (ref 8.9–10.3)
Chloride: 106 mmol/L (ref 98–111)
Creatinine, Ser: 1.46 mg/dL — ABNORMAL HIGH (ref 0.61–1.24)
GFR, Estimated: 56 mL/min — ABNORMAL LOW (ref >60)
Glucose, Bld: 155 mg/dL — ABNORMAL HIGH (ref 70–99)
Potassium: 4.3 mmol/L (ref 3.5–5.1)
Sodium: 139 mmol/L (ref 135–145)

## 2023-05-30 LAB — GLUCOSE, CAPILLARY
Glucose-Capillary: 153 mg/dL — ABNORMAL HIGH (ref 70–99)
Glucose-Capillary: 173 mg/dL — ABNORMAL HIGH (ref 70–99)

## 2023-05-30 MED ORDER — CARVEDILOL 6.25 MG PO TABS: 6.2500 mg | ORAL_TABLET | Freq: Two times a day (BID) | ORAL | 0 refills | Status: DC

## 2023-05-30 MED ORDER — PANTOPRAZOLE SODIUM 40 MG PO TBEC
40.0000 mg | DELAYED_RELEASE_TABLET | Freq: Every day | ORAL | Status: DC
  Administered 2023-05-30: 40 mg via ORAL
  Filled 2023-05-30: qty 1

## 2023-05-30 MED ORDER — ASPIRIN 81 MG PO TBEC: 81.0000 mg | DELAYED_RELEASE_TABLET | Freq: Every day | ORAL | 0 refills | Status: AC

## 2023-05-30 MED ORDER — TORSEMIDE 40 MG PO TABS: 40.0000 mg | ORAL_TABLET | ORAL | 0 refills | Status: DC

## 2023-05-30 MED ORDER — TORSEMIDE 40 MG PO TABS: 40.0000 mg | ORAL_TABLET | Freq: Every day | ORAL | 0 refills | Status: DC

## 2023-05-30 MED ORDER — NITROGLYCERIN 0.4 MG SL SUBL: 0.4000 mg | SUBLINGUAL_TABLET | SUBLINGUAL | 0 refills | Status: DC | PRN

## 2023-05-30 NOTE — Plan of Care (Signed)

## 2023-05-30 NOTE — Progress Notes (Signed)
[facility] HF Stewardship  PCP: Danelle Berry, PA-C  PCP-Cardiologist: Debbe Odea, MD  HPI: Cory Rivas is a 57 y.o. male with chronic HFpEF, chronic respiratory failure with hypoxia, hypertension, hyperlipidemia, type 2 diabetes mellitus, chronic kidney disease stage IIIa, and tobacco abuse who presented with  substernal chest pain, diaphoresis, and shortness of breath. EKG showed no ischemic changes and HS-troponin peaked at 26. TTE on 8/13 showed LVEF of 50-55% with grade III diastolic dysfunction. Underwent cardiac cath on 05/29/23, which found moderate, nonobstructive CAD (50-60% ostial & proximal LAD). RHC showed RA of 7 mmHg, PA (S/D, mean) of 45/16 (26) mmHg, PCWP (mean) of 18 mmHg, PA sat of 64%, Fick CO of 5.1 L/min, Fick CI of 2.4 L/min/m^2, and PVR of 1.6 Wood units.  Pertinent Lab Values: BUN  Date Value Ref Range Status  05/30/2023 21 (H) 6 - 20 mg/dL Final  65/78/4696 13 4 - 21 Final   Potassium  Date Value Ref Range Status  05/30/2023 4.3 3.5 - 5.1 mmol/L Final   Sodium  Date Value Ref Range Status  05/30/2023 139 135 - 145 mmol/L Final  12/18/2017 142 137 - 147 Final   Brain Natriuretic Peptide  Date Value Ref Range Status  07/01/2020 66 <100 pg/mL Final    Comment:    . BNP levels increase with age in the general population with the highest values seen in individuals greater than 38 years of age. Reference: J. Am. Ladon Applebaum. Cardiol. 2002; 29:528-413. .    B Natriuretic Peptide  Date Value Ref Range Status  05/27/2023 199.1 (H) 0.0 - 100.0 pg/mL Final    Comment:    Performed at [facility], [address] [place], [place] [address]   TSH  Date Value Ref Range Status  12/18/2017 1.86 0.41 - 5.90 Final    Vital Signs: Temp:  [97.7 F (36.5 C)-98.3 F (36.8 C)] 98.3 F (36.8 C) (08/15 0742) Pulse Rate:  [47-63] 57 (08/15 0742) Cardiac Rhythm: Sinus bradycardia (08/15 0731) Resp:  [15-23] 20 (08/15 0742) BP: (116-151)/(63-94) 118/63  (08/15 0742) SpO2:  [90 %-98 %] 94 % (08/15 0742) Weight:  [104.3 kg (229 lb 14.4 oz)] 104.3 kg (229 lb 14.4 oz) (08/15 0326)   Intake/Output Summary (Last 24 hours) at 05/30/2023 0754 Last data filed at 05/30/2023 0420 Gross per 24 hour  Intake 1963 ml  Output --  Net 1963 ml    Current Inpatient Medications:  Beta Blocker: Carvedilol 6.25 mg BID Angiotensin-Converting Enzyme Inhibitor (ACEi) or Angiotensin II Receptor Blocker (ARB): Lisinopril 40 mg daily Sodium-glucose Cotransporter-2 Inhibitors (SGLT2i): Jardiance 10 mg daily    Prior to admission HF Medications:  Loop Diuretic: Torsemide 20 mg prn Beta Blocker: Carvedilol 12.5 mg BID Angiotensin-Converting Enzyme Inhibitor (ACEi) or Angiotensin II Receptor Blocker (ARB): Lisinopril 40 mg daily Sodium-glucose Cotransporter-2 Inhibitors (SGLT2i): Jardiance 10 mg daily   Previous intolerance to Entresto due to dizziness/hypotension Other vasoreactive medications include amlodipine  Assessment: 1. Diastolic heart failure (LVEF 50-55%), due to non-ICM. NYHA class II symptoms.  -SBP stable 110-120s. Pulse 50-60s. Currently on major guideline recommend HFpEF medications save spironolactone. Can consider dose reduction of amlodipine in order to add. -Volume -Uses medication pill packs at home. Will need to walk patient though any changes to medication to adjust his pack accordingly.  Plan: 1) Medication changes recommended at this time: -Consider adding spironolactone 12.5 mg daily and decreasing potassium to 20 mg daily.  2) Patient assistance: -None  3) Education: - Patient has been  educated on current HF medications and potential additions to HF medication regimen - Patient verbalizes understanding that over the next few months, these medication doses may change and more medications may be added to optimize HF regimen - Patient has been educated on basic disease state pathophysiology and goals of therapy   Medication  Assistance / Insurance Benefits Check:  Does the patient have prescription insurance? Prescription Insurance: Medicaid  Type of insurance plan:   Does the patient qualify for medication assistance through manufacturers or grants? No   Eligible grants and/or patient assistance programs: no   Outpatient Pharmacy:  Prior to admission outpatient pharmacy: [facility]     Thank you for involving pharmacy in this patient's care.  Enos Fling, PharmD, BCPS Clinical Pharmacist 05/30/2023 10:24 AM

## 2023-05-30 NOTE — Progress Notes (Signed)
   Patient Name: Cory Rivas Date of Encounter: 05/30/2023 [facility] Cardiologist: Debbe Odea, MD   Interval Summary  .    R/LHC yesterday showed nonobstructive CAD with mild pulmonary hypertension as outlined in cath report.  Renal function stable. No chest pain or dyspnea. Has ambulated in the hallway without cardiac limitation.   Vital Signs .    Vitals:   05/29/23 1921 05/29/23 2315 05/30/23 0326 05/30/23 0742  BP: 129/84 116/65 128/71 118/63  Pulse: 63 (!) 56 (!) 59 (!) 57  Resp: 18 18 18 20   Temp: 98.3 F (36.8 C) 98.3 F (36.8 C) 98.2 F (36.8 C) 98.3 F (36.8 C)  TempSrc: Oral Oral Oral   SpO2: 98% 93% 94% 94%  Weight:   104.3 kg   Height:        Intake/Output Summary (Last 24 hours) at 05/30/2023 1048 Last data filed at 05/30/2023 0420 Gross per 24 hour  Intake 1843 ml  Output --  Net 1843 ml      05/30/2023    3:26 AM 05/28/2023    3:11 AM 05/27/2023    7:41 PM  Last 3 Weights  Weight (lbs) 229 lb 14.4 oz 229 lb 8 oz 230 lb  Weight (kg) 104.282 kg 104.101 kg 104.327 kg      Telemetry/ECG    Normal sinus rhythm and sinus bradycardia - Personally Reviewed  Physical Exam .   GEN: No acute distress.   Neck: Unable to assess JVP due to body habitus. Cardiac: Bradycardic but regular with 1/6 systolic murmur. Right radial arteriotomy site without acute bleeding or tenderness. Mild bruising noted. Radial pulse 2+.  Respiratory: Clear to auscultation bilaterally. GI: Soft, nontender, non-distended  MS: Trace pretibial edema  Assessment & Plan .     Acute on chronic HFpEF: Patient's echo showed normal LVEF with grade 3 diastolic dysfunction.  Right and left heart catheterization on 05/29/2023 showed nonobstructive CAD with upper normal to mildly elevated left and right heart filling pressures as well as mild pulmonary hypertension.  Cardiac output/index are borderline low.   -Findings are consistent with HFpEF. -With stable renal function,  start torsemide 40 mg daily alternating with 40 mg bid on MWF. -Now on lower dose carvedilol in the setting of sinus bradycardia; chronotropic incompetence/suppression can worsen symptoms of HFpEF. -Continue empagliflozin.   -Could consider transitioning lisinopril to Entresto in the office and/or adding spironolactone if renal function and potassium allow. -Per the patient's brother-in-law, Cory Rivas drinks only soda and eats fried/processed foods.   -He will need continuing education regarding lifestyle modifications to help minimize risk for recurrent CHF exacerbations.  Nonobstructive coronary artery disease with elevated troponin and HLD: -Mildly elevated troponin peaking at 26 -Chest pain on admission most likely due to uncontrolled hypertension and HFpEF.   -Cath this admission did not show hemodynamically significant disease. -Post cath instructions -LDL 130 in 03/2023, now on Crestor with continuation of Zetia. -If LDL remains above goal in follow up, consider addition of PCSK9i. -Continue aspirin.  Acute on CKD stage II: -Renal function stable. -Monitor.  HTN: -Blood pressure improved. -Medical therapy as above.    For questions or updates, please contact [facility] Please consult [email or web address] for contact info under [facility].     Signed, Eula Listen, PA-C

## 2023-05-30 NOTE — Plan of Care (Addendum)
Nutrition Education Note  RD consulted for nutrition education regarding CHF and diabetes.  Lab Results  Component Value Date   HGBA1C 10.2 (H) 03/21/2023   Spoke with pt at bedside, who was pleasant and in good spirits today. He just completed a walk with nurse tech and he is very grateful for this; pt shares that he had neck surgery a few years back and neck often is stiff, but improves with ambulation.   Pt shares that he has a good appetite. He usually consumes 2-3 meals per day. Breakfast is usually eggs and toast or a sausage biscuit, Dinner is a meat, starch, and vegetable. 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 He consumes mostly water and Crystal Light.   Pt reports he checks his weight daily. He reports that will often go to the ED it it goes up 5 pounds. [facility] with [facility] and often contacts the NP on site. Pt admits that he has been missing some of his follow-up appointments as his phone was malfunctioning and was unable to look up appointment dates. Pt has some social and financial difficulties including multiple deaths, loss of job, and a Tax inspector. He admits that he has been having difficulty remembering to take his medications, but has a lot more ease in doing so now as his receives his medications in bubble packs. He also routinely skips his insulin dose in the afternoon.   RD reinforced taking medications as prescribed and importance of doing so- recommending setting reminders to assist with taking medications. Reviewed importance of daily weights as well as parameters to when to contact [facility]; stressed that doing so could potentially avoid an ED visit or readmission. Pt denies any difficulty obtaining his medications or transportation, stating he or his sister picks up medications and transports to appointments.   Case discussed with RN and MD.   RD provided "Heart Healthy,  Consistent Carbohydrate Nutrition Therapy" handout from the Academy of [employer]. Reviewed patient's dietary recall. Provided examples on ways to decrease sodium intake in diet. Discouraged intake of processed foods and use of salt shaker. Encouraged fresh fruits and vegetables as well as whole grain sources of carbohydrates to maximize fiber intake.   RD discussed why it is important for patient to adhere to diet recommendations, and emphasized the role of fluids, foods to avoid, and importance of weighing self daily.   Discussed different food groups and their effects on blood sugar, emphasizing carbohydrate-containing foods. Provided list of carbohydrates and recommended serving sizes of common foods.  Discussed importance of controlled and consistent carbohydrate intake throughout the day. Provided examples of ways to balance meals/snacks and encouraged intake of high-fiber, whole grain complex carbohydrates. Teach back method used.  Expect fair to good compliance.  Body mass index is 37.11 kg/m. Pt meets criteria for obesity, class II based on current BMI. Obesity is a complex, chronic medical condition that is optimally managed by a multidisciplinary care team. Weight loss is not an ideal goal for an acute inpatient hospitalization. However, if further work-up for obesity is warranted, consider outpatient referral to [facility].    Current diet order is carb modified, patient is consuming approximately 100% of meals at this time. Labs and medications reviewed. No further nutrition interventions warranted at this time. RD contact information provided. If additional nutrition issues arise, please re-consult RD.     W, RD, LDN, CDCES Registered Dietitian II Certified Diabetes Care and Education Specialist Please refer to [facility] for RD and/or RD on-call/weekend/after hours pager

## 2023-05-31 LAB — LIPOPROTEIN A (LPA): Lipoprotein (a): <8.4 nmol/L (ref <75.0)

## 2023-05-31 NOTE — Discharge Summary (Addendum)
Physician Discharge Summary  Patient: Cory Rivas JYN:[id number] DOB: 1965-12-14   Code Status: Prior Admit date: 05/27/2023 Discharge date: 05/30/2023 Disposition: Home, No home health services recommended PCP: Cory Berry, PA-C  Recommendations for Outpatient Follow-up:  Follow up with PCP within 1-2 weeks Regarding general hospital follow up and preventative care Recommend CBC, metabolic panel Follow up with cardiology   Discharge Diagnoses:  Principal Problem:   Hypertensive urgency Active Problems:   Acute on chronic diastolic congestive heart failure (HCC)   Chest pain   Type 2 diabetes mellitus with peripheral neuropathy (HCC)   Class 2 severe obesity with serious comorbidity and body mass index (BMI) of 39.0 to 39.9 in adult [facility])   Gout   Acute on chronic heart failure with preserved ejection fraction (HFpEF) (HCC)   GERD without esophagitis   COPD with asthma   Dyslipidemia   Unstable angina (HCC)   Acute on chronic diastolic heart failure [facility])  Brief Hospital Course Summary: Cory Rivas is a 57 y.o. male  with medical history significant for CHF, type 2 diabetes mellitus, gout, hypertension COPD, asthma, CKD stage III, dyslipidemia, who presented to the emergency room with acute onset of chest pain, shortness of breath and diaphoresis.  His blood pressure was significantly elevated to 204/102 on admission.  Troponins were mildly elevated up to 26.  He was admitted to the hospital for hypertensive urgency, chest pain, acute on chronic diastolic CHF and concern for unstable angina. Cardiology was consulted and he underwent cardiac catheterization 8/14 which revealed non-obstructive coronary artery disease.  With improved treatment of his hypertension, his symptoms resolved.  Cardiology modified his home regimen of medication to optimize his heart health as seen below.  He also received education while inpatient from the diabetes coordinator and registered  dietician.   Discharge Condition: Good, improved Recommended discharge diet: Cardiac diet  Consultations: Cardiology   Procedures/Studies: [facility]  Allergies as of 05/30/2023       Reactions   Oxycodone Nausea And Vomiting   Entresto [sacubitril-valsartan] Nausea Only, Other (See Comments)   Lightheaded and dizzy    Onion Nausea And Vomiting   Nitroglycerin Itching, Rash        Medication List     STOP taking these medications    amLODipine 10 MG tablet Commonly known as: NORVASC   lidocaine 5 % Commonly known as: Lidoderm       TAKE these medications    allopurinol 300 MG tablet Commonly known as: ZYLOPRIM TAKE ONE TABLET BY MOUTH DAILY WITH 100 MG TABLET. FOR A TOTAL OF 400 MG DAILY FOR GOUT   aspirin EC 81 MG tablet Take 1 tablet (81 mg total) by mouth daily. Swallow whole.   carvedilol 6.25 MG tablet Commonly known as: COREG Take 1 tablet (6.25 mg total) by mouth 2 (two) times daily with a meal. What changed:  medication strength how much to take   DULoxetine 30 MG capsule Commonly known as: CYMBALTA TAKE ONE CAPSULE BY MOUTH TWO TIMES DAILY What changed: See the new instructions.   ezetimibe 10 MG tablet Commonly known as: ZETIA TAKE ONE TABLET BY MOUTH DAILY   fluticasone 50 MCG/ACT nasal spray Commonly known as: FLONASE Place 1 spray into both nostrils daily. What changed:  when to take this reasons to take this   insulin lispro 100 UNIT/ML KwikPen Commonly known as: HUMALOG Inject 1-45 Units into the skin 3 (three) times daily. Sliding scale up to 45 units per injection  ipratropium-albuterol 0.5-2.5 (3) MG/3ML Soln Commonly known as: DUONEB Take 3 mLs by nebulization every 6 (six) hours as needed (SOB wheeze cough).   Jardiance 10 MG Tabs tablet Generic drug: empagliflozin TAKE ONE TABLET BY MOUTH DAILY BEFORE BREAKFAST   Lantus SoloStar 100 UNIT/ML Solostar Pen Generic drug: insulin glargine Inject 20 Units into the skin at  bedtime.   levocetirizine 5 MG tablet Commonly known as: XYZAL TAKE ONE TABLET BY MOUTH EVERY EVENING   lisinopril 40 MG tablet Commonly known as: ZESTRIL Take 1 tablet (40 mg total) by mouth daily.   metFORMIN 1000 MG tablet Commonly known as: GLUCOPHAGE TAKE ONE TABLET BY MOUTH TWO TIMES A DAY6 WITHA MEAL What changed:  how much to take how to take this when to take this additional instructions   nitroGLYCERIN 0.4 MG SL tablet Commonly known as: NITROSTAT Place 1 tablet (0.4 mg total) under the tongue every 5 (five) minutes as needed for chest pain.   pantoprazole 40 MG tablet Commonly known as: PROTONIX Take 1 tablet (40 mg total) by mouth 2 (two) times daily.   potassium chloride SA 20 MEQ tablet Commonly known as: KLOR-CON M Take 1 tablet (20 mEq total) by mouth 2 (two) times daily. Take with furosemide   pregabalin 50 MG capsule Commonly known as: Lyrica Take 1 capsule (50 mg total) by mouth 2 (two) times daily.   rosuvastatin 40 MG tablet Commonly known as: CRESTOR TAKE ONE TABLET BY MOUTH DAILY   Spiriva Respimat 2.5 MCG/ACT Aers Generic drug: Tiotropium Bromide Monohydrate Inhale 2 puffs into the lungs daily.   Torsemide 40 MG Tabs Take 40 mg by mouth daily. What changed:  medication strength how much to take   Torsemide 40 MG Tabs Take 40 mg by mouth every Monday, Wednesday, and Friday. In addition to your daily 40mg  tablet taken every day What changed:  medication strength how much to take when to take this reasons to take this additional instructions   Trulicity 3 MG/0.5ML Sopn Generic drug: Dulaglutide Inject 3 mg into the skin once a week. SATURDAYS   Ventolin HFA 108 (90 Base) MCG/ACT inhaler Generic drug: albuterol INHALE TWO PUFFS INTO THE LUNGS EVERY SIX HOURS AS NEEDED FOR WHEEZING OR SHORTNESS OF BREATH        Follow-up Information     Cory Iba, MD. Go in 2 week(s).   Specialty: Cardiology Why: Appointment on  Friday, 06/21/2023 at 2:20pm. Contact information: [address] [place] [address] [phone]                 Subjective   Pt reports feeling well. Denies chest pain or SOB at rest or with exerting himself in his room. Denies LE edema. He states that he will take his medications as prescribed.   All questions and concerns were addressed at time of discharge.  Objective  Blood pressure 126/72, pulse 61, temperature 98.2 F (36.8 C), resp. rate 20, height 5\' 6"  (1.676 m), weight 104.3 kg, SpO2 95%.   General: Pt is alert, awake, not in acute distress Cardiovascular: RRR, S1/S2 +, no rubs, no gallops Respiratory: CTA bilaterally, no wheezing, no rhonchi Abdominal: Soft, NT, ND, bowel sounds + Extremities: no edema, no cyanosis  The results of significant diagnostics from this hospitalization (including imaging, microbiology, ancillary and laboratory) are listed below for reference.   Imaging studies: CARDIAC CATHETERIZATION  Result Date: 05/29/2023 Conclusions: Moderate, nonobstructive coronary artery disease with sequential 50-60% ostial and proximal LAD  stenoses that are not hemodynamically significant (RFR = 0.93).  There is also a 40% ostial stenosis of a large first OM branch.  Nondominant RCA is without significant disease. Upper normal to mildly elevated left and right heart filling pressures. Mild pulmonary hypertension. Borderline low Fick cardiac output/index. Mild aortic stenosis. Recommendations: Resume gentle diuresis tomorrow if renal function is stable. Medical therapy and risk factor modification to prevent progression of coronary artery disease. Yvonne Kendall, MD [facility]  ECHOCARDIOGRAM COMPLETE  Result Date: 05/28/2023    ECHOCARDIOGRAM REPORT   Patient Name:   JAMEIL SUSSMAN Date of Exam: 05/28/2023 Medical Rec #:  [id number]       Height:       66.0 in Accession #:    [id number]      Weight:       229.5 lb Date of Birth:  Dec 05, 1965         BSA:          2.120 m Patient Age:    57 years        BP:           152/85 mmHg Patient Gender: M               HR:           60 bpm. Exam Location:  [facility] Procedure: 2D Echo, Cardiac Doppler, Color Doppler and Strain Analysis Indications:     Chest Pain  History:         Patient has prior history of Echocardiogram examinations, most                  recent 08/23/2022. CHF, COPD, Signs/Symptoms:Chest Pain and                  Murmur; Risk Factors:Hypertension, Diabetes and Former Smoker.                  CKD.  Sonographer:     Mikki Harbor Referring Phys:  [id number] SHERI HAMMOCK Diagnosing Phys: Yvonne Kendall MD  Sonographer Comments: Patient is obese. Image acquisition challenging due to respiratory motion. Global longitudinal strain was attempted. IMPRESSIONS  1. Left ventricular ejection fraction, by estimation, is 50 to 55%. The left ventricle has low normal function. The left ventricle has no regional wall motion abnormalities. There is moderate left ventricular hypertrophy. Left ventricular diastolic parameters are consistent with Grade III diastolic dysfunction (restrictive). Elevated left atrial pressure. The average left ventricular global longitudinal strain is -10.0 %. The global longitudinal strain is abnormal.  2. Right ventricular systolic function is normal. The right ventricular size is normal. Tricuspid regurgitation signal is inadequate for assessing PA pressure.  3. Left atrial size was mildly dilated.  4. The mitral valve was not well visualized. Mild mitral valve regurgitation.  5. The aortic valve has an indeterminant number of cusps. There is moderate calcification of the aortic valve. There is moderate thickening of the aortic valve. Aortic valve regurgitation is not visualized. Aortic valve sclerosis/calcification is present, without any evidence of aortic stenosis.  6. The inferior vena cava is normal in size with greater than 50% respiratory variability, suggesting right atrial  pressure of 3 mmHg. FINDINGS  Left Ventricle: Left ventricular ejection fraction, by estimation, is 50 to 55%. The left ventricle has low normal function. The left ventricle has no regional wall motion abnormalities. The average left ventricular global longitudinal strain is -10.0 %. The global longitudinal strain is abnormal. The left ventricular internal cavity size  was normal in size. There is moderate left ventricular hypertrophy. Left ventricular diastolic parameters are consistent with Grade III diastolic dysfunction (restrictive). Elevated left atrial pressure. Right Ventricle: The right ventricular size is normal. No increase in right ventricular wall thickness. Right ventricular systolic function is normal. Tricuspid regurgitation signal is inadequate for assessing PA pressure. Left Atrium: Left atrial size was mildly dilated. Right Atrium: Right atrial size was normal in size. Pericardium: There is no evidence of pericardial effusion. Mitral Valve: The mitral valve was not well visualized. Mild mitral valve regurgitation. MV peak gradient, 10.1 mmHg. The mean mitral valve gradient is 2.0 mmHg. Tricuspid Valve: The tricuspid valve is grossly normal. Tricuspid valve regurgitation is trivial. Aortic Valve: The aortic valve has an indeterminant number of cusps. There is moderate calcification of the aortic valve. There is moderate thickening of the aortic valve. Aortic valve regurgitation is not visualized. Aortic valve sclerosis/calcification  is present, without any evidence of aortic stenosis. Aortic valve mean gradient measures 7.3 mmHg. Aortic valve peak gradient measures 14.5 mmHg. Aortic valve area, by VTI measures 2.70 cm. Pulmonic Valve: The pulmonic valve was not well visualized. Pulmonic valve regurgitation is not visualized. No evidence of pulmonic stenosis. Aorta: The aortic root and ascending aorta are structurally normal, with no evidence of dilitation. Pulmonary Artery: The pulmonary artery is  not well seen. Venous: The inferior vena cava is normal in size with greater than 50% respiratory variability, suggesting right atrial pressure of 3 mmHg. IAS/Shunts: No atrial level shunt detected by color flow Doppler.  LEFT VENTRICLE PLAX 2D LVIDd:         5.30 cm      Diastology LVIDs:         4.15 cm      LV e' medial:    5.55 cm/s LV PW:         1.50 cm      LV E/e' medial:  23.1 LV IVS:        1.70 cm      LV e' lateral:   4.24 cm/s LVOT diam:     2.00 cm      LV E/e' lateral: 30.2 LV SV:         114 LV SV Index:   54           2D Longitudinal Strain LVOT Area:     3.14 cm     2D Strain GLS Avg:     -10.0 %  LV Volumes (MOD) LV vol d, MOD A2C: 90.7 ml LV vol d, MOD A4C: 103.0 ml LV vol s, MOD A2C: 36.2 ml LV vol s, MOD A4C: 45.4 ml LV SV MOD A2C:     54.5 ml LV SV MOD A4C:     103.0 ml LV SV MOD BP:      56.9 ml RIGHT VENTRICLE RV Basal diam:  3.65 cm RV Mid diam:    3.60 cm RV S prime:     11.60 cm/s TAPSE (M-mode): 2.9 cm LEFT ATRIUM             Index        RIGHT ATRIUM           Index LA diam:        4.30 cm 2.03 cm/m   RA Area:     18.20 cm LA Vol (A2C):   55.2 ml 26.03 ml/m  RA Volume:   48.50 ml  22.87 ml/m LA Vol (A4C):   58.5 ml  27.59 ml/m LA Biplane Vol: 57.3 ml 27.03 ml/m  AORTIC VALVE                     PULMONIC VALVE AV Area (Vmax):    2.74 cm      PV Vmax:       1.36 m/s AV Area (Vmean):   2.72 cm      PV Peak grad:  7.4 mmHg AV Area (VTI):     2.70 cm AV Vmax:           190.67 cm/s AV Vmean:          123.667 cm/s AV VTI:            0.423 m AV Peak Grad:      14.5 mmHg AV Mean Grad:      7.3 mmHg LVOT Vmax:         166.50 cm/s LVOT Vmean:        107.050 cm/s LVOT VTI:          0.364 m LVOT/AV VTI ratio: 0.86  AORTA Ao Root diam: 3.40 cm Ao Asc diam:  3.40 cm MITRAL VALVE MV Area (PHT): 3.17 cm     SHUNTS MV Area VTI:   2.47 cm     Systemic VTI:  0.36 m MV Peak grad:  10.1 mmHg    Systemic Diam: 2.00 cm MV Mean grad:  2.0 mmHg MV Vmax:       1.59 m/s MV Vmean:      67.3 cm/s MV Decel  Time: 239 msec MV E velocity: 128.00 cm/s MV A velocity: 51.40 cm/s MV E/A ratio:  2.49 Cristal Deer End MD Electronically signed by Yvonne Kendall MD Signature Date/Time: 05/28/2023/3:00:09 PM    Final    DG Chest 2 View  Result Date: 05/27/2023 CLINICAL DATA:  Chest pain EXAM: CHEST - 2 VIEW COMPARISON:  03/21/2023 FINDINGS: Hardware in the cervical spine. No acute airspace disease or effusion. Normal cardiac size. No pneumothorax IMPRESSION: No active cardiopulmonary disease. Electronically Signed   By: Jasmine Pang M.D.   On: 05/27/2023 20:09    Labs: Basic Metabolic Panel: Recent Labs  Lab 05/27/23 1954 05/28/23 0300 05/29/23 0538 05/29/23 1512 05/29/23 1513 05/30/23 0551  NA 138 138 139 141 141 139  K 3.7 3.6 4.1 3.9 4.0 4.3  CL 101 103 106  --   --  106  CO2 26 25 25   --   --  24  GLUCOSE 197* 185* 169*  --   --  155*  BUN 18 23* 22*  --   --  21*  CREATININE 1.60* 1.44* 1.46*  --   --  1.46*  CALCIUM 9.0 8.6* 8.7*  --   --  8.3*   CBC: Recent Labs  Lab 05/27/23 1954 05/28/23 0300 05/29/23 0538 05/29/23 1512 05/29/23 1513  WBC 8.8 9.6 10.0  --   --   HGB 15.8 15.3 16.0 15.0 15.3  HCT 45.8 43.9 47.6 44.0 45.0  MCV 83.3 83.6 85.6  --   --   PLT 225 212 221  --   --    Microbiology: Results for orders placed or performed during the hospital encounter of 11/22/22  Resp panel by RT-PCR (RSV, Flu A&B, Covid) Anterior Nasal Swab     Status: None   Collection Time: 11/22/22  5:35 PM   Specimen: Anterior Nasal Swab  Result Value Ref Range Status   SARS Coronavirus 2 by RT PCR NEGATIVE  NEGATIVE Final    Comment: (NOTE) SARS-CoV-2 target nucleic acids are NOT DETECTED.  The SARS-CoV-2 RNA is generally detectable in upper respiratory specimens during the acute phase of infection. The lowest concentration of SARS-CoV-2 viral copies this assay can detect is 138 copies/mL. A negative result does not preclude SARS-Cov-2 infection and should not be used as the sole basis  for treatment or other patient management decisions. A negative result may occur with  improper specimen collection/handling, submission of specimen other than nasopharyngeal swab, presence of viral mutation(s) within the areas targeted by this assay, and inadequate number of viral copies(<138 copies/mL). A negative result must be combined with clinical observations, patient history, and epidemiological information. The expected result is Negative.  Fact Sheet for Patients:  BloggerCourse.com  Fact Sheet for Healthcare Providers:  SeriousBroker.it  This test is no t yet approved or cleared by the [place] FDA and  has been authorized for detection and/or diagnosis of SARS-CoV-2 by FDA under an Emergency Use Authorization (EUA). This EUA will remain  in effect (meaning this test can be used) for the duration of the COVID-19 declaration under Section 564(b)(1) of the Act, 21 U.S.C.section 360bbb-3(b)(1), unless the authorization is terminated  or revoked sooner.       Influenza A by PCR NEGATIVE NEGATIVE Final   Influenza B by PCR NEGATIVE NEGATIVE Final    Comment: (NOTE) The Xpert Xpress SARS-CoV-2/FLU/RSV plus assay is intended as an aid in the diagnosis of influenza from Nasopharyngeal swab specimens and should not be used as a sole basis for treatment. Nasal washings and aspirates are unacceptable for Xpert Xpress SARS-CoV-2/FLU/RSV testing.  Fact Sheet for Patients: BloggerCourse.com  Fact Sheet for Healthcare Providers: SeriousBroker.it  This test is not yet approved or cleared by the [place] FDA and has been authorized for detection and/or diagnosis of SARS-CoV-2 by FDA under an Emergency Use Authorization (EUA). This EUA will remain in effect (meaning this test can be used) for the duration of the COVID-19 declaration under Section 564(b)(1) of the Act, 21  U.S.C. section 360bbb-3(b)(1), unless the authorization is terminated or revoked.     Resp Syncytial Virus by PCR NEGATIVE NEGATIVE Final    Comment: (NOTE) Fact Sheet for Patients: BloggerCourse.com  Fact Sheet for Healthcare Providers: SeriousBroker.it  This test is not yet approved or cleared by the [place] FDA and has been authorized for detection and/or diagnosis of SARS-CoV-2 by FDA under an Emergency Use Authorization (EUA). This EUA will remain in effect (meaning this test can be used) for the duration of the COVID-19 declaration under Section 564(b)(1) of the Act, 21 U.S.C. section 360bbb-3(b)(1), unless the authorization is terminated or revoked.  Performed at [facility], [address], [place], [place] [address]     Time coordinating discharge: Over 30 minutes  Leeroy Bock, MD  [facility] 05/31/2023, 5:00 PM

## 2023-06-04 ENCOUNTER — Encounter: Payer: Self-pay | Admitting: Family

## 2023-06-04 ENCOUNTER — Ambulatory Visit: Payer: Medicaid Other | Attending: Family | Admitting: Family

## 2023-06-04 VITALS — BP 163/84 | HR 64 | Wt 223.6 lb

## 2023-06-04 DIAGNOSIS — I130 Hypertensive heart and chronic kidney disease with heart failure and stage 1 through stage 4 chronic kidney disease, or unspecified chronic kidney disease: Secondary | ICD-10-CM | POA: Insufficient documentation

## 2023-06-04 DIAGNOSIS — E1122 Type 2 diabetes mellitus with diabetic chronic kidney disease: Secondary | ICD-10-CM | POA: Insufficient documentation

## 2023-06-04 DIAGNOSIS — I10 Essential (primary) hypertension: Secondary | ICD-10-CM | POA: Diagnosis not present

## 2023-06-04 DIAGNOSIS — E782 Mixed hyperlipidemia: Secondary | ICD-10-CM

## 2023-06-04 DIAGNOSIS — R0789 Other chest pain: Secondary | ICD-10-CM | POA: Diagnosis present

## 2023-06-04 DIAGNOSIS — N189 Chronic kidney disease, unspecified: Secondary | ICD-10-CM | POA: Diagnosis not present

## 2023-06-04 DIAGNOSIS — E785 Hyperlipidemia, unspecified: Secondary | ICD-10-CM | POA: Diagnosis not present

## 2023-06-04 DIAGNOSIS — Z794 Long term (current) use of insulin: Secondary | ICD-10-CM

## 2023-06-04 DIAGNOSIS — I2510 Atherosclerotic heart disease of native coronary artery without angina pectoris: Secondary | ICD-10-CM | POA: Diagnosis not present

## 2023-06-04 DIAGNOSIS — I428 Other cardiomyopathies: Secondary | ICD-10-CM | POA: Insufficient documentation

## 2023-06-04 DIAGNOSIS — F17210 Nicotine dependence, cigarettes, uncomplicated: Secondary | ICD-10-CM | POA: Diagnosis not present

## 2023-06-04 DIAGNOSIS — E119 Type 2 diabetes mellitus without complications: Secondary | ICD-10-CM | POA: Diagnosis not present

## 2023-06-04 DIAGNOSIS — R0602 Shortness of breath: Secondary | ICD-10-CM | POA: Diagnosis present

## 2023-06-04 DIAGNOSIS — I5032 Chronic diastolic (congestive) heart failure: Secondary | ICD-10-CM | POA: Diagnosis not present

## 2023-06-04 DIAGNOSIS — J45909 Unspecified asthma, uncomplicated: Secondary | ICD-10-CM | POA: Insufficient documentation

## 2023-06-04 LAB — BASIC METABOLIC PANEL
BUN/Creatinine Ratio: 14 (ref 9–20)
BUN: 22 mg/dL (ref 6–24)
CO2: 24 mmol/L (ref 20–29)
Calcium: 9.1 mg/dL (ref 8.7–10.2)
Chloride: 100 mmol/L (ref 96–106)
Creatinine, Ser: 1.53 mg/dL — ABNORMAL HIGH (ref 0.76–1.27)
Glucose: 190 mg/dL — ABNORMAL HIGH (ref 70–99)
Potassium: 4.9 mmol/L (ref 3.5–5.2)
Sodium: 139 mmol/L (ref 134–144)
eGFR: 53 mL/min/{1.73_m2} — ABNORMAL LOW (ref >59)

## 2023-06-04 MED ORDER — SPIRONOLACTONE 25 MG PO TABS: 25.0000 mg | ORAL_TABLET | Freq: Every day | ORAL | 1 refills | Status: DC

## 2023-06-04 NOTE — Patient Instructions (Signed)
START SPIRONOLACTONE 25 ONCE DAILY  GO GET YOUR BLOOD WORK DONE TODAY DOWNSTAIRS ON THE LOWER LEVEL INSIDE OF HEARTCARE  GO GET YOUR BLOOD WORK DONE AGAIN IN 10 DAYS AT THE MEDICAL MALL AFTER STARTING THE MEDICATION

## 2023-06-04 NOTE — Progress Notes (Signed)
PCP: Danelle Berry, PA-C (last seen 06/24) Primary Cardiologist: Debbe Odea, MD (last seen 06/24)  HPI:   Cory Rivas is a 57 y/o male with a history of DM, hyperlipidemia, HTN, gout, COPD, asthma, CKD, dyslipidemia, tobacco use and chronic heart failure.   Admitted 08/23/22 due to dyspnea, productive cough of brown sputum, mild sore throat, fatigue, dizziness and chest soreness. Steroid burst given. Initially given IV lasix with transition to oral diuretics. Lisinopril held due to AKI. Discharged after 2 days. Admitted 11/22/22 due to acute on chronic HF. Weaned off of bipap.Was in the ED 03/01/23 due to neck pain after falling off his tractor after he hit a stump. He landed on his right side.Head CT/ cervical spine xrays were negative. Was in the ED 03/09/23 due to atypical chest pain. Chest CTA negative for PE. Was in the ED 03/22/23 due to nonspecific chest pain. Workup negative. Was in the ED 03/23/23 due to hyperglycemia with glucose of >400.    Admitted 05/27/23 due to acute onset of chest pain, shortness of breath and diaphoresis. Blood pressure was significantly elevated to 204/102 on admission. Troponins were mildly elevated up to 26.  Cardiology was consulted and he underwent cardiac catheterization 8/14 which revealed non-obstructive coronary artery disease. Meds adjusted.   Echo 08/23/22: EF of 60-65% along with mild LVH & mild Cory.  Echo 05/28/23: EF 50-55% with moderate LVH, Grade III DD and mild Cory  RHC/LHC 05/29/23: Moderate, nonobstructive coronary artery disease with sequential 50-60% ostial and proximal LAD stenoses that are not hemodynamically significant (RFR = 0.93).  There is also a 40% ostial stenosis of a large first OM branch.  Nondominant RCA is without significant disease. Upper normal to mildly elevated left and right heart filling pressures. Mild pulmonary hypertension. Borderline low Fick cardiac output/index. Mild aortic stenosis.  He presents today for a HF follow-up  visit with a chief complaint of moderate fatigue with minimal exertion. Chronic in nature. Has associated SOB, weakness and intermittent dizziness along with this. Also has bruising on his right forearm from his recent cath. No numbness/ tingling in the hand. Reports right arm feeling sore at times as well. Biggest complaint is of weakness since his recent admission. Denies chest pain, palpitations, cough, abdominal distention, pedal edema, difficulty sleeping or weight gain.   ROS: All systems negative except as listed in HPI, PMH and Problem List.  SH:  Social History   Socioeconomic History   Marital status: Divorced    Spouse name: Not on file   Number of children: 0   Years of education: 9   Highest education level: 9th grade  Occupational History   Occupation: disability  Tobacco Use   Smoking status: Some Days    Current packs/day: 0.00    Average packs/day: 1 pack/day for 37.0 years (37.0 ttl pk-yrs)    Types: Cigarettes    Start date: 10/19/1989    Last attempt to quit: 08/20/2022    Years since quitting: 0.7   Smokeless tobacco: Never   Tobacco comments:    30+ years hx smoking 2 ppd, stopped smoking briefly in April 2022 for a surgery and then restarted smoking about 1/2 ppd last 4-5 months         5-6cigs daily- 04/10/2023  Vaping Use   Vaping status: Never Used  Substance and Sexual Activity   Alcohol use: No   Drug use: No   Sexual activity: Not Currently    Partners: Female  Other Topics Concern  Not on file  Social History Narrative   Lives with sister Cory Rivas, brother in law Cory Rivas), and his mother - Cory Rivas   Social Determinants of Health   Financial Resource Strain: Medium Risk (12/06/2022)   Overall Financial Resource Strain (CARDIA)    Difficulty of Paying Living Expenses: Somewhat hard  Food Insecurity: Food Insecurity Present (05/28/2023)   Hunger Vital Sign    Worried About Running Out of Food in the Last Year: Often true    Ran Out of Food in the  Last Year: Often true  Transportation Needs: No Transportation Needs (05/28/2023)   PRAPARE - Administrator, Civil Service (Medical): No    Lack of Transportation (Non-Medical): No  Physical Activity: Insufficiently Active (12/06/2022)   Exercise Vital Sign    Days of Exercise per Week: 3 days    Minutes of Exercise per Session: 10 min  Stress: No Stress Concern Present (12/06/2022)   [employer] of Occupational Health - Occupational Stress Questionnaire    Feeling of Stress : Only a little  Social Connections: Socially Isolated (12/06/2022)   Social Connection and Isolation Panel [NHANES]    Frequency of Communication with Friends and Family: More than three times a week    Frequency of Social Gatherings with Friends and Family: Twice a week    Attends Religious Services: Never    Database administrator or Organizations: No    Attends Banker Meetings: Never    Marital Status: Divorced  Catering manager Violence: Not At Risk (05/28/2023)   Humiliation, Afraid, Rape, and Kick questionnaire    Fear of Current or Ex-Partner: No    Emotionally Abused: No    Physically Abused: No    Sexually Abused: No    FH:  Family History  Problem Relation Age of Onset   Heart disease Mother    Diabetes Mother    Hyperlipidemia Mother    Hypertension Mother    Heart attack Mother    Lung cancer Father    Hypertension Father    Stroke Father    AAA (abdominal aortic aneurysm) Maternal Grandmother    Heart attack Maternal Grandmother    Diabetes Maternal Grandfather     Past Medical History:  Diagnosis Date   Bulging of cervical intervertebral disc    CHF (congestive heart failure) (HCC)    Diabetes mellitus without complication (HCC)    Gout    High cholesterol    Hypertension    Stress due to family tension 04/15/2019    Current Outpatient Medications  Medication Sig Dispense Refill   allopurinol (ZYLOPRIM) 300 MG tablet TAKE ONE TABLET BY MOUTH DAILY  WITH 100 MG TABLET. FOR A TOTAL OF 400 MG DAILY FOR GOUT 90 tablet 1   aspirin EC 81 MG tablet Take 1 tablet (81 mg total) by mouth daily. Swallow whole. 60 tablet 0   carvedilol (COREG) 6.25 MG tablet Take 1 tablet (6.25 mg total) by mouth 2 (two) times daily with a meal. 60 tablet 0   DULoxetine (CYMBALTA) 30 MG capsule TAKE ONE CAPSULE BY MOUTH TWO TIMES DAILY (Patient taking differently: Take 30 mg by mouth daily as needed.) 180 capsule 1   ezetimibe (ZETIA) 10 MG tablet TAKE ONE TABLET BY MOUTH DAILY 90 tablet 3   fluticasone (FLONASE) 50 MCG/ACT nasal spray Place 1 spray into both nostrils daily. (Patient taking differently: Place 1 spray into both nostrils daily as needed for rhinitis or allergies.) 18.2 mL 6  insulin glargine (LANTUS SOLOSTAR) 100 UNIT/ML Solostar Pen Inject 20 Units into the skin at bedtime.     insulin lispro (HUMALOG) 100 UNIT/ML KwikPen Inject 1-45 Units into the skin 3 (three) times daily. Sliding scale up to 45 units per injection     ipratropium-albuterol (DUONEB) 0.5-2.5 (3) MG/3ML SOLN Take 3 mLs by nebulization every 6 (six) hours as needed (SOB wheeze cough). 180 mL 1   JARDIANCE 10 MG TABS tablet TAKE ONE TABLET BY MOUTH DAILY BEFORE BREAKFAST 30 tablet 3   levocetirizine (XYZAL) 5 MG tablet TAKE ONE TABLET BY MOUTH EVERY EVENING 90 tablet 1   lisinopril (ZESTRIL) 40 MG tablet Take 1 tablet (40 mg total) by mouth daily. 90 tablet 3   metFORMIN (GLUCOPHAGE) 1000 MG tablet TAKE ONE TABLET BY MOUTH TWO TIMES A DAY6 WITHA MEAL (Patient taking differently: Take 1,000 mg by mouth at bedtime.) 180 tablet 3   nitroGLYCERIN (NITROSTAT) 0.4 MG SL tablet Place 1 tablet (0.4 mg total) under the tongue every 5 (five) minutes as needed for chest pain. 15 tablet 0   pantoprazole (PROTONIX) 40 MG tablet Take 1 tablet (40 mg total) by mouth 2 (two) times daily. 60 tablet 0   potassium chloride SA (KLOR-CON M) 20 MEQ tablet Take 1 tablet (20 mEq total) by mouth 2 (two) times daily.  Take with furosemide 60 tablet 5   pregabalin (LYRICA) 50 MG capsule Take 1 capsule (50 mg total) by mouth 2 (two) times daily. 60 capsule 2   rosuvastatin (CRESTOR) 40 MG tablet TAKE ONE TABLET BY MOUTH DAILY 90 tablet 3   Tiotropium Bromide Monohydrate (SPIRIVA RESPIMAT) 2.5 MCG/ACT AERS Inhale 2 puffs into the lungs daily. 30 each 11   torsemide 40 MG TABS Take 40 mg by mouth daily. 30 tablet 0   torsemide 40 MG TABS Take 40 mg by mouth every Monday, Wednesday, and Friday. In addition to your daily 40mg  tablet taken every day 30 tablet 0   TRULICITY 3 MG/0.5ML SOPN Inject 3 mg into the skin once a week. SATURDAYS     VENTOLIN HFA 108 (90 Base) MCG/ACT inhaler INHALE TWO PUFFS INTO THE LUNGS EVERY SIX HOURS AS NEEDED FOR WHEEZING OR SHORTNESS OF BREATH 18 each 2   No current facility-administered medications for this visit.   Facility-Administered Medications Ordered in Other Visits  Medication Dose Route Frequency Provider Last Rate Last Admin   albuterol (PROVENTIL) (2.5 MG/3ML) 0.083% nebulizer solution 2.5 mg  2.5 mg Nebulization Once Raechel Chute, MD       Vitals:   06/04/23 0838  BP: (!) 163/84  Pulse: 64  SpO2: 96%  Weight: 223 lb 9.6 oz (101.4 kg)   Wt Readings from Last 3 Encounters:  06/04/23 223 lb 9.6 oz (101.4 kg)  05/30/23 229 lb 14.4 oz (104.3 kg)  04/22/23 229 lb (103.9 kg)   Lab Results  Component Value Date   CREATININE 1.46 (H) 05/30/2023   CREATININE 1.46 (H) 05/29/2023   CREATININE 1.44 (H) 05/28/2023   PHYSICAL EXAM:  General:  Well appearing. No resp difficulty HEENT: neck stiff when turning head side to side Neck: supple. JVP flat. No lymphadenopathy or thryomegaly appreciated. Cor: PMI normal. Regular rate & rhythm. No rubs, gallops. 2/6 systolic murmur at LSB. Lungs: clear Abdomen: soft, nontender, nondistended. No hepatosplenomegaly. No bruits or masses.  Extremities: no cyanosis, clubbing, rash, edema Neuro: alert & oriented x 3, cranial  nerves grossly intact. Moves all 4 extremities w/o difficulty. Affect pleasant. Skin:  bruising noted on right forearm, + pulses  ECG: 05/29/23 showed NSR   ASSESSMENT & PLAN:  1: NICM with preserved ejection fraction with LVH- - suscpect due to HTN and OSA - NYHA class III - euvolemic - weighing daily; reminded to call for an overnight weight gain of >2 pounds or a weekly weight gain of > 5 pounds - weight down 6 pounds from last visit here 1 month ago - echo 08/23/22: EF of 60-65% along with mild LVH & mild Cory. - echo 05/28/23: EF 50-55% with moderate LVH, Grade III DD and mild Cory - continue jardiance 10mg  QD - continue torsemide 40mg  daily with additional 40mg  on M, W, F  - continue potassium to BID - continue lisinopril 40mg  daily  - continue carvedilol 6.25mg  BID - begin spironolactone 25mg  daily - BMP today with possible stoppage or reduction in potassium supplements after results obtained - BMP in 7-10 days as well - drinking less fluids and is trying to keep it closer to 60-64 ounces - not adding salt but continues to eat higher sodium foods  - BNP 05/27/23 was 199.1  2: HTN- - BP 163/84 & he has taken his meds today; adding spiro per above - saw PCP Cory Rivas) 06/24  - BMP 05/30/23 reviewed and showed sodium 139, potassium 4.3, creatinine 1.46 and GFR 56  3: DM- - A1c 05/02/23 was 10.6% - currently on trulicity, metformin and insulin - saw endocrinology (Cory Rivas) 07/24 - 03/21/23 microalbumin/ creat ratio was 1338  4: Reactive airway disease- - saw pulmonology (Cory Rivas) 06/24 - using spiriva  - continues to not smoke - wearing CPAP most nights   5: Hyperlipidemia- - continue ezetimibe 10mg  daily - continue rosuvastatin 40mg  daily - lipid panel 03/21/23 showed LDL 130, triglycerides 536  6: CAD- - saw cardiology (Agbor-Etang) 06/24 - continue ezetimibe 10mg  daily - continue rosuvastatin 40mg  daily - forearm bruising should continue to resolve - RHC/LHC  05/29/23: Moderate, nonobstructive coronary artery disease with sequential 50-60% ostial and proximal LAD stenoses that are not hemodynamically significant (RFR = 0.93).  There is also a 40% ostial stenosis of a large first OM branch.  Nondominant RCA is without significant disease. Upper normal to mildly elevated left and right heart filling pressures. Mild pulmonary hypertension. Borderline low Fick cardiac output/index. Mild aortic stenosis.  Return in 1 month, sooner if needed.

## 2023-06-05 ENCOUNTER — Encounter: Payer: Self-pay | Admitting: Family

## 2023-06-05 NOTE — Telephone Encounter (Signed)
-----   Message from Delma Freeze sent at 06/05/2023 10:34 AM EDT ----- Your potassium is normal but at the high end of normal. When you start the spironolactone, do not take anymore potassium tablets (those great big tablets). As a reminder [facility] will need to [facility] 7-10 days after you start taking the spironolactone to get your lab work rechecked.

## 2023-06-20 NOTE — Progress Notes (Signed)
Cardiology Office Note    Date:  06/21/2023   ID:  Cory Rivas, DOB July 18, 1966, MRN [id number]  PCP:  Danelle Berry, PA-C  Cardiologist:  Debbe Odea, MD  Electrophysiologist:  None   Chief Complaint: Hospital follow-up  History of Present Illness:   Cory Rivas is a 57 y.o. male with history of nonobstructive CAD, HFpEF, CKD stage II, IDDM, HTN, HLD, cervical myelopathy, idiopathic chronic gout, tobacco use for 35+years, asthma, obesity with OSA and tracheobronchomalacia who presents for hospital follow-up as outlined below.  In the setting of preoperative cardiac risk stratification for murmur he underwent Lexiscan MPI in 09/2020 which showed no significant ischemia or coronary artery calcification on CT imaging.  Echo in 10/2020 showed an EF of 60 to 65%, no regional wall motion abnormalities, grade 1 diastolic dysfunction, and no significant valvular abnormalities.  Repeat echo in 08/2022 showed an EF of 60 to 65%, no regional wall motion normalities, mild LVH, grade 2 diastolic dysfunction, normal RV systolic function and ventricular cavity size mild mitral regurgitation, and aortic valve sclerosis without evidence of stenosis.  He was admitted to the hospital in 05/2023 with chest pain and shortness of breath with diaphoresis after eating dinner.  He was markedly hypertensive upon arrival with a blood pressure of 210/103.  High-sensitivity troponin 26 trending to 24.  BNP 199.  Chest x-ray without acute cardiopulmonary process.  Echo on 05/28/2023 showed an EF of 50 to 55%, no regional wall motion abnormalities, moderate LVH, grade 3 diastolic dysfunction, elevated left atrial pressure, normal RV systolic function and ventricular cavity size, mildly dilated left atrium, mild mitral regurgitation, aortic valve sclerosis without evidence of stenosis, and an estimated right atrial pressure of 3 mmHg.  R/LHC on 05/29/2023 showed moderate, nonobstructive CAD with sequential 50 to 60%  ostial and proximal LAD stenoses that were not hemodynamically significant (RFR 0.93).  There was also 40% ostial stenosis of a large OM1 branch.  The nondominant RCA was without significant disease.  Upper normal to mildly elevated left and right heart filling pressures.  Mild pulmonary hypertension.  Borderline low cardiac output/index.  Mild aortic stenosis.  Gentle diuresis and medical therapy/risk factor modification were recommended.  At time of discharge, it was recommended he take 40 mg daily alternating with 40 mg twice daily, carvedilol and Jardiance with consideration to transition from ACE inhibitor to ARNI in the outpatient setting along with the addition of MRA.  Blood pressure regimen was optimized and he was diuresed with symptomatic improvement.  He comes in doing well from a cardiac perspective and is without symptoms of angina or cardiac decompensation.  No progressive dyspnea, orthopnea, PND, or early satiety.  No significant lower extremity swelling or abdominal distention.  He has stable two-pillow orthopnea and at times has to get up to lay on the couch to sleep, though this is in setting of underlying cervical spine discomfort and unrelated to orthopnea.  He reports good urine output with torsemide.  He indicates he is drinking a little less than 2 L of liquid daily and reports he is not adding salt to food or eating foods high in salt.  Weight has remained stable at home.  His weight is down 7 pounds by our scale today when compared to his last visit in our office following inpatient diuresis.  He does continue to smoke approximately 1/2 pack cigarettes per day, will only take 1 or 2 drags and then let the cigarette burn out.  Has been  under increased stress following the unexpected passing of his mother in an MVA earlier this summer.   Labs independently reviewed: 06/2023 - potassium 4.2, BUN 18, serum creatinine 1.34 and 4.6, AST/ALT normal 05/2023 - LP(a) less than 8.4, Hgb 16.0, PLT  221, albumin 3.8, AST/ALT normal 04/2023 - TSH normal, A1c 10.6 03/2023 - TC 229, TG 536, HDL 43, direct LDL 128  Past Medical History:  Diagnosis Date   Bulging of cervical intervertebral disc    CHF (congestive heart failure) (HCC)    Diabetes mellitus without complication (HCC)    Gout    High cholesterol    Hypertension    Stress due to family tension 04/15/2019    Past Surgical History:  Procedure Laterality Date   CHOLECYSTECTOMY     COLONOSCOPY WITH PROPOFOL N/A 03/10/2020   Procedure: COLONOSCOPY WITH PROPOFOL;  Surgeon: Midge Minium, MD;  Location: [facility] ENDOSCOPY;  Service: Endoscopy;  Laterality: N/A;   CORONARY PRESSURE/FFR STUDY N/A 05/29/2023   Procedure: CORONARY PRESSURE/FFR STUDY;  Surgeon: Yvonne Kendall, MD;  Location: [facility] INVASIVE CV LAB;  Service: Cardiovascular;  Laterality: N/A;   RIGHT/LEFT HEART CATH AND CORONARY ANGIOGRAPHY N/A 05/29/2023   Procedure: RIGHT/LEFT HEART CATH AND CORONARY ANGIOGRAPHY;  Surgeon: Yvonne Kendall, MD;  Location: [facility] INVASIVE CV LAB;  Service: Cardiovascular;  Laterality: N/A;    Current Medications: Current Meds  Medication Sig   allopurinol (ZYLOPRIM) 300 MG tablet TAKE ONE TABLET BY MOUTH DAILY WITH 100 MG TABLET. FOR A TOTAL OF 400 MG DAILY FOR GOUT   aspirin EC 81 MG tablet Take 1 tablet (81 mg total) by mouth daily. Swallow whole.   empagliflozin (JARDIANCE) 10 MG TABS tablet Take 1 tablet (10 mg total) by mouth daily.   ezetimibe (ZETIA) 10 MG tablet TAKE ONE TABLET BY MOUTH DAILY   fluticasone (FLONASE) 50 MCG/ACT nasal spray Place 1 spray into both nostrils daily. (Patient taking differently: Place 1 spray into both nostrils daily as needed for rhinitis or allergies.)   insulin glargine (LANTUS SOLOSTAR) 100 UNIT/ML Solostar Pen Inject 20 Units into the skin at bedtime.   insulin lispro (HUMALOG) 100 UNIT/ML KwikPen Inject 1-45 Units into the skin 3 (three) times daily. Sliding scale up to 45 units per injection    ipratropium-albuterol (DUONEB) 0.5-2.5 (3) MG/3ML SOLN Take 3 mLs by nebulization every 6 (six) hours as needed (SOB wheeze cough).   levocetirizine (XYZAL) 5 MG tablet TAKE ONE TABLET BY MOUTH EVERY EVENING   lisinopril (ZESTRIL) 40 MG tablet Take 1 tablet (40 mg total) by mouth daily.   metFORMIN (GLUCOPHAGE) 1000 MG tablet TAKE ONE TABLET BY MOUTH TWO TIMES A DAY6 WITHA MEAL (Patient taking differently: Take 1,000 mg by mouth at bedtime.)   nitroGLYCERIN (NITROSTAT) 0.4 MG SL tablet Place 1 tablet (0.4 mg total) under the tongue every 5 (five) minutes as needed for chest pain.   pregabalin (LYRICA) 50 MG capsule Take 1 capsule (50 mg total) by mouth 2 (two) times daily. (Patient taking differently: Take 50 mg by mouth 2 (two) times daily. prn)   rosuvastatin (CRESTOR) 40 MG tablet TAKE ONE TABLET BY MOUTH DAILY   spironolactone (ALDACTONE) 25 MG tablet Take 1 tablet (25 mg total) by mouth daily.   Tiotropium Bromide Monohydrate (SPIRIVA RESPIMAT) 2.5 MCG/ACT AERS Inhale 2 puffs into the lungs daily.   torsemide 40 MG TABS Take 40 mg by mouth every Monday, Wednesday, and Friday. In addition to your daily 40mg  tablet taken every day   TRULICITY  3 MG/0.5ML SOPN Inject 3 mg into the skin once a week. SATURDAYS   VENTOLIN HFA 108 (90 Base) MCG/ACT inhaler INHALE TWO PUFFS INTO THE LUNGS EVERY SIX HOURS AS NEEDED FOR WHEEZING OR SHORTNESS OF BREATH   [DISCONTINUED] carvedilol (COREG) 6.25 MG tablet Take 1 tablet (6.25 mg total) by mouth 2 (two) times daily with a meal.    Allergies:   Oxycodone, Entresto [sacubitril-valsartan], Onion, and Nitroglycerin   Social History   Socioeconomic History   Marital status: Divorced    Spouse name: Not on file   Number of children: 0   Years of education: 9   Highest education level: 9th grade  Occupational History   Occupation: disability  Tobacco Use   Smoking status: Some Days    Current packs/day: 0.00    Average packs/day: 1 pack/day for 37.0 years  (37.0 ttl pk-yrs)    Types: Cigarettes    Start date: 10/19/1989    Last attempt to quit: 08/20/2022    Years since quitting: 0.8   Smokeless tobacco: Never   Tobacco comments:    30+ years hx smoking 2 ppd, stopped smoking briefly in April 2022 for a surgery and then restarted smoking about 1/2 ppd last 4-5 months         5-6cigs daily- 04/10/2023  Vaping Use   Vaping status: Never Used  Substance and Sexual Activity   Alcohol use: No   Drug use: No   Sexual activity: Not Currently    Partners: Female  Other Topics Concern   Not on file  Social History Narrative   Lives with sister [name], brother in law [name]), and his mother - Lendall Clemmer   Social Determinants of Health   Financial Resource Strain: Medium Risk (12/06/2022)   Overall Financial Resource Strain (CARDIA)    Difficulty of Paying Living Expenses: Somewhat hard  Food Insecurity: Food Insecurity Present (05/28/2023)   Hunger Vital Sign    Worried About Running Out of Food in the Last Year: Often true    Ran Out of Food in the Last Year: Often true  Transportation Needs: No Transportation Needs (05/28/2023)   PRAPARE - Administrator, Civil Service (Medical): No    Lack of Transportation (Non-Medical): No  Physical Activity: Insufficiently Active (12/06/2022)   Exercise Vital Sign    Days of Exercise per Week: 3 days    Minutes of Exercise per Session: 10 min  Stress: No Stress Concern Present (12/06/2022)   [employer] of Occupational Health - Occupational Stress Questionnaire    Feeling of Stress : Only a little  Social Connections: Socially Isolated (12/06/2022)   Social Connection and Isolation Panel [NHANES]    Frequency of Communication with Friends and Family: More than three times a week    Frequency of Social Gatherings with Friends and Family: Twice a week    Attends Religious Services: Never    Database administrator or Organizations: No    Attends Engineer, structural: Never     Marital Status: Divorced     Family History:  The patient's family history includes AAA (abdominal aortic aneurysm) in his maternal grandmother; Diabetes in his maternal grandfather and mother; Heart attack in his maternal grandmother and mother; Heart disease in his mother; Hyperlipidemia in his mother; Hypertension in his father and mother; Lung cancer in his father; Stroke in his father.  ROS:   12-point review of systems is negative unless otherwise noted in the HPI.  EKGs/Labs/Other Studies Reviewed:    Studies reviewed were summarized above. The additional studies were reviewed today:  [facility] 05/29/2023: Conclusions: Moderate, nonobstructive coronary artery disease with sequential 50-60% ostial and proximal LAD stenoses that are not hemodynamically significant (RFR = 0.93).  There is also a 40% ostial stenosis of a large first OM branch.  Nondominant RCA is without significant disease. Upper normal to mildly elevated left and right heart filling pressures. Mild pulmonary hypertension. Borderline low Fick cardiac output/index. Mild aortic stenosis.   Recommendations: Resume gentle diuresis tomorrow if renal function is stable. Medical therapy and risk factor modification to prevent progression of coronary artery disease. __________  2D echo 05/28/2023: 1. Left ventricular ejection fraction, by estimation, is 50 to 55%. The  left ventricle has low normal function. The left ventricle has no regional  wall motion abnormalities. There is moderate left ventricular hypertrophy.  Left ventricular diastolic  parameters are consistent with Grade III diastolic dysfunction  (restrictive). Elevated left atrial pressure. The average left ventricular  global longitudinal strain is -10.0 %. The global longitudinal strain is  abnormal.   2. Right ventricular systolic function is normal. The right ventricular  size is normal. Tricuspid regurgitation signal is inadequate for assessing  PA  pressure.   3. Left atrial size was mildly dilated.   4. The mitral valve was not well visualized. Mild mitral valve  regurgitation.   5. The aortic valve has an indeterminant number of cusps. There is  moderate calcification of the aortic valve. There is moderate thickening  of the aortic valve. Aortic valve regurgitation is not visualized. Aortic  valve sclerosis/calcification is  present, without any evidence of aortic stenosis.   6. The inferior vena cava is normal in size with greater than 50%  respiratory variability, suggesting right atrial pressure of 3 mmHg.  __________  2D echo 08/23/2022: 1. Left ventricular ejection fraction, by estimation, is 60 to 65%. The  left ventricle has normal function. The left ventricle has no regional  wall motion abnormalities. There is mild left ventricular hypertrophy.  Left ventricular diastolic parameters  are consistent with Grade II diastolic dysfunction (pseudonormalization).   2. Right ventricular systolic function is normal. The right ventricular  size is normal.   3. The mitral valve is normal in structure. Mild mitral valve  regurgitation. No evidence of mitral stenosis.   4. The aortic valve is calcified. Aortic valve regurgitation is not  visualized. Aortic valve sclerosis/calcification is present, without any  evidence of aortic stenosis.   5. The inferior vena cava is normal in size with greater than 50%  respiratory variability, suggesting right atrial pressure of 3 mmHg.  __________  2D echo 11/04/2020: 1. Left ventricular ejection fraction, by estimation, is 60 to 65%. The  left ventricle has normal function. The left ventricle has no regional  wall motion abnormalities. Left ventricular diastolic parameters are  consistent with Grade I diastolic  dysfunction (impaired relaxation).There is mild left ventricular  hypertrophy of the basal segment. LVOT gradient of 10 mm Hg at rest, 22 mm  Hg gradient with valsalva   2. Right  ventricular systolic function is normal. The right ventricular  size is normal. There is normal pulmonary artery systolic pressure. The  estimated right ventricular systolic pressure is 31.6 mmHg.   3. Left atrial size was mildly dilated.  ____________  Eugenie Birks MPI 10/10/2020: Low risk, probably normal pharmacologic myocardial perfusion stress tst. There is a small in size, moderate in severity, fixed defect at the apex  most likely representing artifact (apical thinning and attenuation) and less likely scar. There is no evidence of significant ischemia. The left ventricular ejection fraction is normal (59%). There is no significant coronary artery calcification on the attenuation correction CT.   EKG:  EKG is ordered today.  The EKG ordered today demonstrates NSR, 68 bpm, inferolateral T wave inversion consistent with prior tracings  Recent Labs: 05/27/2023: B Natriuretic Peptide 199.1 05/29/2023: Hemoglobin 15.3; Platelets 221 06/21/2023: ALT 19; BUN 18; Creatinine, Ser 1.34; Potassium 4.2; Sodium 136  Recent Lipid Panel    Component Value Date/Time   CHOL 229 (H) 03/21/2023 1203   TRIG 536 (H) 03/21/2023 1203   HDL 43 03/21/2023 1203   CHOLHDL 5.3 (H) 03/21/2023 1203   VLDL 53 (H) 06/08/2022 1534   LDLCALC  03/21/2023 1203     Comment:     . LDL cholesterol not calculated. Triglyceride levels greater than 400 mg/dL invalidate calculated LDL results. . Reference range: <100 . Desirable range <100 mg/dL for primary prevention;   <70 mg/dL for patients with CHD or diabetic patients  with > or = 2 CHD risk factors. Marland Kitchen LDL-C is now calculated using the Martin-Hopkins  calculation, which is a validated novel method providing  better accuracy than the Friedewald equation in the  estimation of LDL-C.  Horald Pollen et al. Lenox Ahr. 4098;119(14): 2061-2068  (http://education.[facility].com/faq/FAQ164)    LDLDIRECT 130 (H) 03/21/2023 1203    PHYSICAL EXAM:    VS:  BP (!) 158/98  (BP Location: Left Arm, Patient Position: Sitting, Cuff Size: Normal)   Pulse 68   Ht 5' 6.5" (1.689 m)   Wt 222 lb 3.2 oz (100.8 kg)   SpO2 94%   BMI 35.33 kg/m   BMI: Body mass index is 35.33 kg/m.  Physical Exam Vitals reviewed.  Constitutional:      Appearance: He is well-developed.  HENT:     Head: Normocephalic and atraumatic.  Eyes:     General:        Right eye: No discharge.        Left eye: No discharge.  Neck:     Vascular: No JVD.  Cardiovascular:     Rate and Rhythm: Normal rate and regular rhythm.     Heart sounds: S1 normal and S2 normal. Heart sounds not distant. No midsystolic click and no opening snap. Murmur heard.     Systolic murmur is present with a grade of 1/6 at the upper right sternal border.     No friction rub.  Pulmonary:     Effort: Pulmonary effort is normal. No respiratory distress.     Breath sounds: Normal breath sounds. No decreased breath sounds, wheezing or rales.  Chest:     Chest wall: No tenderness.  Abdominal:     General: There is no distension.  Musculoskeletal:     Cervical back: Normal range of motion.     Right lower leg: No edema.     Left lower leg: No edema.  Skin:    General: Skin is warm and dry.     Nails: There is no clubbing.  Neurological:     Mental Status: He is alert and oriented to person, place, and time.  Psychiatric:        Speech: Speech normal.        Behavior: Behavior normal.        Thought Content: Thought content normal.        Judgment: Judgment normal.  Wt Readings from Last 3 Encounters:  06/21/23 222 lb 3.2 oz (100.8 kg)  06/21/23 224 lb 12.8 oz (102 kg)  06/04/23 223 lb 9.6 oz (101.4 kg)     ASSESSMENT & PLAN:   Nonobstructive CAD: He is without symptoms of angina or cardiac decompensation.  Recent LHC showed nonobstructive disease as outlined above.  Continue aggressive risk factor modification and primary prevention including aspirin, carvedilol, ezetimibe, lisinopril, rosuvastatin,  and as needed SL NTG.  No post cath complications.  No indication for further ischemic testing at this time.  Chronic HFpEF: Euvolemic and well compensated with NYHA class II symptoms.  Unable to transition from ACE inhibitor to Entresto secondary to intolerance/allergy with associated dizziness and profound nausea/vomiting while on Entresto previously.  Titrate carvedilol to 12.5 mg twice daily given elevated blood pressure readings.  Otherwise, continue Jardiance, lisinopril, spironolactone, and torsemide 40 mg twice daily Monday, Wednesday, Friday, and 40 mg every other day.  Labs earlier today showed continued improvement in renal function.  HTN: Blood pressure continues to run on the high side at home ranging from the 140s to 160s systolic.  Titrate carvedilol to 12.5 mg twice daily.  Unable to transition ACE inhibitor to ARNI secondary to allergy/intolerance as outlined above.  He will otherwise continue lisinopril.  HLD: LDL 128 in 03/2023 with goal being less than 70.  Remains on rosuvastatin 40 mg and ezetimibe.  Working on heart healthy diet.  In follow-up, would recommend obtaining fasting lipid panel, if LDL remains above goal at that time would need to consider PCSK9 inhibitor or bempedoic acid.  Obesity with asthma, OSA and tracheobronchomalacia: Weight loss is encouraged through heart healthy diet and regular exercise.  CPAP adherence is encouraged.  Follow-up with pulmonology as directed.    Disposition: F/u with Dr. Azucena Cecil or an APP in 2 months.   Medication Adjustments/Labs and Tests Ordered: Current medicines are reviewed at length with the patient today.  Concerns regarding medicines are outlined above. Medication changes, Labs and Tests ordered today are summarized above and listed in the Patient Instructions accessible in Encounters.   Signed, Eula Listen, PA-C 06/21/2023 4:19 PM     [facility] [place] [address] [place], [place]  [address] [phone]

## 2023-06-21 ENCOUNTER — Ambulatory Visit: Payer: Medicaid Other | Attending: Cardiology | Admitting: Physician Assistant

## 2023-06-21 ENCOUNTER — Ambulatory Visit: Payer: Medicaid Other | Admitting: Family Medicine

## 2023-06-21 ENCOUNTER — Encounter: Payer: Self-pay | Admitting: Family Medicine

## 2023-06-21 ENCOUNTER — Other Ambulatory Visit
Admission: RE | Admit: 2023-06-21 | Discharge: 2023-06-21 | Disposition: A | Discharge status: Home / Self Care | Payer: Medicaid Other | Attending: Family Medicine | Admitting: Family Medicine

## 2023-06-21 ENCOUNTER — Encounter: Payer: Self-pay | Admitting: Physician Assistant

## 2023-06-21 VITALS — BP 144/76 | HR 78 | Temp 98.3°F | Resp 16 | Ht 67.0 in | Wt 224.8 lb

## 2023-06-21 VITALS — BP 158/98 | HR 68 | Ht 66.5 in | Wt 222.2 lb

## 2023-06-21 DIAGNOSIS — Z794 Long term (current) use of insulin: Secondary | ICD-10-CM | POA: Diagnosis not present

## 2023-06-21 DIAGNOSIS — F4321 Adjustment disorder with depressed mood: Secondary | ICD-10-CM

## 2023-06-21 DIAGNOSIS — M5412 Radiculopathy, cervical region: Secondary | ICD-10-CM

## 2023-06-21 DIAGNOSIS — I5032 Chronic diastolic (congestive) heart failure: Secondary | ICD-10-CM | POA: Diagnosis not present

## 2023-06-21 DIAGNOSIS — E669 Obesity, unspecified: Secondary | ICD-10-CM | POA: Diagnosis not present

## 2023-06-21 DIAGNOSIS — R5383 Other fatigue: Secondary | ICD-10-CM

## 2023-06-21 DIAGNOSIS — M5136 Other intervertebral disc degeneration, lumbar region: Secondary | ICD-10-CM

## 2023-06-21 DIAGNOSIS — G959 Disease of spinal cord, unspecified: Secondary | ICD-10-CM

## 2023-06-21 DIAGNOSIS — I428 Other cardiomyopathies: Secondary | ICD-10-CM | POA: Insufficient documentation

## 2023-06-21 DIAGNOSIS — G4733 Obstructive sleep apnea (adult) (pediatric): Secondary | ICD-10-CM | POA: Diagnosis not present

## 2023-06-21 DIAGNOSIS — G8929 Other chronic pain: Secondary | ICD-10-CM

## 2023-06-21 DIAGNOSIS — J4489 Other specified chronic obstructive pulmonary disease: Secondary | ICD-10-CM | POA: Diagnosis not present

## 2023-06-21 DIAGNOSIS — E1169 Type 2 diabetes mellitus with other specified complication: Secondary | ICD-10-CM

## 2023-06-21 DIAGNOSIS — Z6835 Body mass index (BMI) 35.0-35.9, adult: Secondary | ICD-10-CM

## 2023-06-21 DIAGNOSIS — M1A09X0 Idiopathic chronic gout, multiple sites, without tophus (tophi): Secondary | ICD-10-CM | POA: Diagnosis not present

## 2023-06-21 DIAGNOSIS — M549 Dorsalgia, unspecified: Secondary | ICD-10-CM

## 2023-06-21 DIAGNOSIS — I10 Essential (primary) hypertension: Secondary | ICD-10-CM | POA: Diagnosis not present

## 2023-06-21 DIAGNOSIS — I2510 Atherosclerotic heart disease of native coronary artery without angina pectoris: Secondary | ICD-10-CM

## 2023-06-21 DIAGNOSIS — E785 Hyperlipidemia, unspecified: Secondary | ICD-10-CM | POA: Diagnosis not present

## 2023-06-21 DIAGNOSIS — R0789 Other chest pain: Secondary | ICD-10-CM

## 2023-06-21 DIAGNOSIS — F419 Anxiety disorder, unspecified: Secondary | ICD-10-CM | POA: Diagnosis not present

## 2023-06-21 DIAGNOSIS — Z981 Arthrodesis status: Secondary | ICD-10-CM

## 2023-06-21 DIAGNOSIS — R079 Chest pain, unspecified: Secondary | ICD-10-CM

## 2023-06-21 DIAGNOSIS — R1013 Epigastric pain: Secondary | ICD-10-CM | POA: Diagnosis not present

## 2023-06-21 DIAGNOSIS — E1165 Type 2 diabetes mellitus with hyperglycemia: Secondary | ICD-10-CM

## 2023-06-21 DIAGNOSIS — Z79899 Other long term (current) drug therapy: Secondary | ICD-10-CM | POA: Insufficient documentation

## 2023-06-21 LAB — COMPREHENSIVE METABOLIC PANEL
ALT: 19 U/L (ref 0–44)
AST: 17 U/L (ref 15–41)
Albumin: 4.6 g/dL (ref 3.5–5.0)
Alkaline Phosphatase: 73 U/L (ref 38–126)
Anion gap: 11 (ref 5–15)
BUN: 18 mg/dL (ref 6–20)
CO2: 27 mmol/L (ref 22–32)
Calcium: 9.4 mg/dL (ref 8.9–10.3)
Chloride: 98 mmol/L (ref 98–111)
Creatinine, Ser: 1.34 mg/dL — ABNORMAL HIGH (ref 0.61–1.24)
GFR, Estimated: >60 mL/min (ref >60)
Glucose, Bld: 190 mg/dL — ABNORMAL HIGH (ref 70–99)
Potassium: 4.2 mmol/L (ref 3.5–5.1)
Sodium: 136 mmol/L (ref 135–145)
Total Bilirubin: 0.7 mg/dL (ref 0.3–1.2)
Total Protein: 8.3 g/dL — ABNORMAL HIGH (ref 6.5–8.1)

## 2023-06-21 MED ORDER — CARVEDILOL 12.5 MG PO TABS: 12.5000 mg | ORAL_TABLET | Freq: Two times a day (BID) | ORAL | 3 refills | Status: DC

## 2023-06-21 MED ORDER — ALLOPURINOL 300 MG PO TABS
ORAL_TABLET | ORAL | 1 refills | Status: DC
Start: 2023-06-21 — End: 2023-11-11

## 2023-06-21 MED ORDER — PANTOPRAZOLE SODIUM 40 MG PO TBEC
40.0000 mg | DELAYED_RELEASE_TABLET | Freq: Every day | ORAL | 1 refills | Status: DC
Start: 2023-06-21 — End: 2023-06-21

## 2023-06-21 MED ORDER — EMPAGLIFLOZIN 10 MG PO TABS
10.0000 mg | ORAL_TABLET | Freq: Every day | ORAL | 1 refills | Status: DC
Start: 2023-06-21 — End: 2023-11-11

## 2023-06-21 NOTE — Progress Notes (Signed)
Patient ID: SLADER VASCONEZ, male    DOB: 06-15-1966, 57 y.o.   MRN: [id number]  PCP: Danelle Berry, PA-C  Chief Complaint  Patient presents with   Chest Pain    Pressure since last seen at Hospital    Subjective:   HORICE BORSETH is a 57 y.o. male, presents to clinic with CC of the following:  Chest tight he's feeling anxious as well with recent trauma and death of his mother and aunt in June  Recent admission 8/12 with cardiac cath  F/up with cardiology 8/20: ASSESSMENT & PLAN:   1: NICM with preserved ejection fraction with LVH- - suscpect due to HTN and OSA - NYHA class III - euvolemic - weighing daily; reminded to call for an overnight weight gain of >2 pounds or a weekly weight gain of > 5 pounds - weight down 6 pounds from last visit here 1 month ago - echo 08/23/22: EF of 60-65% along with mild LVH & mild MR. - echo 05/28/23: EF 50-55% with moderate LVH, Grade III DD and mild MR - continue jardiance 10mg  QD - continue torsemide 40mg  daily with additional 40mg  on M, W, F  - continue potassium to BID - continue lisinopril 40mg  daily  - continue carvedilol 6.25mg  BID - begin spironolactone 25mg  daily - BMP today with possible stoppage or reduction in potassium supplements after results obtained - BMP in 7-10 days as well - drinking less fluids and is trying to keep it closer to 60-64 ounces - not adding salt but continues to eat higher sodium foods  - BNP 05/27/23 was 199.1   2: HTN- - BP 163/84 & he has taken his meds today; adding spiro per above - saw PCP Angelica Chessman) 06/24  - BMP 05/30/23 reviewed and showed sodium 139, potassium 4.3, creatinine 1.46 and GFR 56   3: DM- - A1c 05/02/23 was 10.6% - currently on trulicity, metformin and insulin - saw endocrinology (Rhinehart) 07/24 - 03/21/23 microalbumin/ creat ratio was 1338 Lab Results  Component Value Date   HGBA1C 10.2 (H) 03/21/2023     4: Reactive airway disease- - saw pulmonology (Dgayli) 06/24 -  using spiriva  - continues to not smoke - wearing CPAP most nights    5: Hyperlipidemia- - continue ezetimibe 10mg  daily - continue rosuvastatin 40mg  daily - lipid panel 03/21/23 showed LDL 130, triglycerides 536   6: CAD- - saw cardiology (Agbor-Etang) 06/24 - continue ezetimibe 10mg  daily - continue rosuvastatin 40mg  daily - forearm bruising should continue to resolve - RHC/LHC 05/29/23: Moderate, nonobstructive coronary artery disease with sequential 50-60% ostial and proximal LAD stenoses that are not hemodynamically significant (RFR = 0.93).  There is also a 40% ostial stenosis of a large first OM branch.  Nondominant RCA is without significant disease. Upper normal to mildly elevated left and right heart filling pressures. Mild pulmonary hypertension. Borderline low Fick cardiac output/index. Mild aortic stenosis.   Return in 1 month, sooner if needed.   He has cardiology f/up later today - goes to HF clinic as well  Neck pain and moods/stress/grief/loss poorly controlled   Patient Active Problem List   Diagnosis Date Noted   Acute on chronic diastolic heart failure (HCC) 05/30/2023   Unstable angina (HCC) 05/29/2023   Hypertensive urgency 05/27/2023   Chest pain 05/27/2023   Type 2 diabetes mellitus with peripheral neuropathy (HCC) 05/27/2023   GERD without esophagitis 05/27/2023   COPD with asthma 05/27/2023   Dyslipidemia 05/27/2023   Acute  decompensated heart failure (HCC) 11/23/2022   Hypertensive emergency 11/22/2022   Acute respiratory failure with hypoxia (HCC) 11/22/2022   Elevated troponin 11/22/2022   Acute on chronic heart failure with preserved ejection fraction (HFpEF) (HCC) 08/24/2022   Acute on chronic diastolic congestive heart failure (HCC) 08/23/2022   Class 2 obesity 08/23/2022   Type 2 diabetes mellitus with hyperglycemia (HCC) 08/23/2022   Grade I diastolic dysfunction 06/08/2022   LVH (left ventricular hypertrophy) 06/08/2022   Accelerated  hypertension 06/08/2022   S/P cervical spinal fusion 08/18/2021   Former heavy tobacco smoker 08/18/2021   Gout 08/18/2021   Other chronic pain 10/26/2020   Systolic murmur 10/26/2020   Tobacco use 10/26/2020   Polyp of ascending colon    Rectal polyp    Class 2 severe obesity with serious comorbidity and body mass index (BMI) of 39.0 to 39.9 in adult [facility]) 04/15/2019   Chronic kidney disease (CKD) stage G2/A3, mildly decreased glomerular filtration rate (GFR) between 60-89 mL/min/1.73 square meter and albuminuria creatinine ratio greater than 300 mg/g 04/15/2019   Proteinuria 04/15/2019   Hyperlipidemia associated with type 2 diabetes mellitus (HCC) 03/18/2019   Diabetes mellitus (HCC) 03/18/2019   Idiopathic chronic gout of multiple sites without tophus 03/18/2019   Essential hypertension 03/18/2019   Dermatitis 03/18/2019   Lumbar degenerative disc disease 11/22/2015   Cervical myelopathy (HCC) 11/22/2015   Osteoarthritis of spine with radiculopathy, lumbar region 11/22/2015      Current Outpatient Medications:    allopurinol (ZYLOPRIM) 300 MG tablet, TAKE ONE TABLET BY MOUTH DAILY WITH 100 MG TABLET. FOR A TOTAL OF 400 MG DAILY FOR GOUT, Disp: 90 tablet, Rfl: 1   aspirin EC 81 MG tablet, Take 1 tablet (81 mg total) by mouth daily. Swallow whole., Disp: 60 tablet, Rfl: 0   carvedilol (COREG) 6.25 MG tablet, Take 1 tablet (6.25 mg total) by mouth 2 (two) times daily with a meal., Disp: 60 tablet, Rfl: 0   DULoxetine (CYMBALTA) 30 MG capsule, TAKE ONE CAPSULE BY MOUTH TWO TIMES DAILY (Patient taking differently: Take 30 mg by mouth daily as needed.), Disp: 180 capsule, Rfl: 1   ezetimibe (ZETIA) 10 MG tablet, TAKE ONE TABLET BY MOUTH DAILY, Disp: 90 tablet, Rfl: 3   fluticasone (FLONASE) 50 MCG/ACT nasal spray, Place 1 spray into both nostrils daily. (Patient taking differently: Place 1 spray into both nostrils daily as needed for rhinitis or allergies.), Disp: 18.2 mL, Rfl: 6    insulin glargine (LANTUS SOLOSTAR) 100 UNIT/ML Solostar Pen, Inject 20 Units into the skin at bedtime., Disp: , Rfl:    insulin lispro (HUMALOG) 100 UNIT/ML KwikPen, Inject 1-45 Units into the skin 3 (three) times daily. Sliding scale up to 45 units per injection, Disp: , Rfl:    ipratropium-albuterol (DUONEB) 0.5-2.5 (3) MG/3ML SOLN, Take 3 mLs by nebulization every 6 (six) hours as needed (SOB wheeze cough)., Disp: 180 mL, Rfl: 1   JARDIANCE 10 MG TABS tablet, TAKE ONE TABLET BY MOUTH DAILY BEFORE BREAKFAST, Disp: 30 tablet, Rfl: 3   levocetirizine (XYZAL) 5 MG tablet, TAKE ONE TABLET BY MOUTH EVERY EVENING, Disp: 90 tablet, Rfl: 1   lisinopril (ZESTRIL) 40 MG tablet, Take 1 tablet (40 mg total) by mouth daily., Disp: 90 tablet, Rfl: 3   metFORMIN (GLUCOPHAGE) 1000 MG tablet, TAKE ONE TABLET BY MOUTH TWO TIMES A DAY6 WITHA MEAL (Patient taking differently: Take 1,000 mg by mouth at bedtime.), Disp: 180 tablet, Rfl: 3   nitroGLYCERIN (NITROSTAT) 0.4 MG SL  tablet, Place 1 tablet (0.4 mg total) under the tongue every 5 (five) minutes as needed for chest pain., Disp: 15 tablet, Rfl: 0   pantoprazole (PROTONIX) 40 MG tablet, Take 1 tablet (40 mg total) by mouth 2 (two) times daily., Disp: 60 tablet, Rfl: 0   potassium chloride SA (KLOR-CON M) 20 MEQ tablet, Take 1 tablet (20 mEq total) by mouth 2 (two) times daily. Take with furosemide, Disp: 60 tablet, Rfl: 5   pregabalin (LYRICA) 50 MG capsule, Take 1 capsule (50 mg total) by mouth 2 (two) times daily., Disp: 60 capsule, Rfl: 2   rosuvastatin (CRESTOR) 40 MG tablet, TAKE ONE TABLET BY MOUTH DAILY, Disp: 90 tablet, Rfl: 3   spironolactone (ALDACTONE) 25 MG tablet, Take 1 tablet (25 mg total) by mouth daily., Disp: 90 tablet, Rfl: 1   Tiotropium Bromide Monohydrate (SPIRIVA RESPIMAT) 2.5 MCG/ACT AERS, Inhale 2 puffs into the lungs daily., Disp: 30 each, Rfl: 11   torsemide 40 MG TABS, Take 40 mg by mouth daily., Disp: 30 tablet, Rfl: 0   torsemide 40 MG  TABS, Take 40 mg by mouth every Monday, Wednesday, and Friday. In addition to your daily 40mg  tablet taken every day, Disp: 30 tablet, Rfl: 0   TRULICITY 3 MG/0.5ML SOPN, Inject 3 mg into the skin once a week. SATURDAYS, Disp: , Rfl:    VENTOLIN HFA 108 (90 Base) MCG/ACT inhaler, INHALE TWO PUFFS INTO THE LUNGS EVERY SIX HOURS AS NEEDED FOR WHEEZING OR SHORTNESS OF BREATH, Disp: 18 each, Rfl: 2 No current facility-administered medications for this visit.  Facility-Administered Medications Ordered in Other Visits:    albuterol (PROVENTIL) (2.5 MG/3ML) 0.083% nebulizer solution 2.5 mg, 2.5 mg, Nebulization, Once, Raechel Chute, MD   Allergies  Allergen Reactions   Oxycodone Nausea And Vomiting   Entresto [Sacubitril-Valsartan] Nausea Only and Other (See Comments)    Lightheaded and dizzy    Onion Nausea And Vomiting   Nitroglycerin Itching and Rash     Social History   Tobacco Use   Smoking status: Some Days    Current packs/day: 0.00    Average packs/day: 1 pack/day for 37.0 years (37.0 ttl pk-yrs)    Types: Cigarettes    Start date: 10/19/1989    Last attempt to quit: 08/20/2022    Years since quitting: 0.8   Smokeless tobacco: Never   Tobacco comments:    30+ years hx smoking 2 ppd, stopped smoking briefly in April 2022 for a surgery and then restarted smoking about 1/2 ppd last 4-5 months         5-6cigs daily- 04/10/2023  Vaping Use   Vaping status: Never Used  Substance Use Topics   Alcohol use: No   Drug use: No      Chart Review Today: I personally reviewed active problem list, medication list, allergies, family history, social history, health maintenance, notes from last encounter, lab results, imaging with the patient/caregiver today.   Review of Systems  Constitutional: Negative.   HENT: Negative.    Eyes: Negative.   Respiratory:  Positive for chest tightness.   Cardiovascular:  Positive for chest pain.  Gastrointestinal: Negative.   Endocrine: Negative.    Genitourinary: Negative.   Musculoskeletal: Negative.   Skin: Negative.   Allergic/Immunologic: Negative.   Neurological: Negative.   Hematological: Negative.   Psychiatric/Behavioral: Negative.    All other systems reviewed and are negative.      Objective:   Vitals:   06/21/23 0943 06/21/23 1003  BP: Marland Kitchen)  154/82 (!) 144/76  Pulse: 78   Resp: 16   Temp: 98.3 F (36.8 C)   TempSrc: Oral   SpO2: 94%   Weight: 224 lb 12.8 oz (102 kg)   Height: 5\' 7"  (1.702 m)     Body mass index is 35.21 kg/m.  Physical Exam Vitals and nursing note reviewed.  Constitutional:      General: He is not in acute distress.    Appearance: Normal appearance. He is obese. He is not ill-appearing, toxic-appearing or diaphoretic.  HENT:     Head: Normocephalic and atraumatic.     Right Ear: External ear normal.     Left Ear: External ear normal.  Eyes:     General: No scleral icterus.       Right eye: No discharge.        Left eye: No discharge.     Conjunctiva/sclera: Conjunctivae normal.  Cardiovascular:     Rate and Rhythm: Normal rate and regular rhythm.     Pulses: Normal pulses.     Heart sounds: Normal heart sounds.  Pulmonary:     Effort: Pulmonary effort is normal. No respiratory distress.     Breath sounds: Normal breath sounds. No stridor. No wheezing or rales.  Abdominal:     General: Bowel sounds are normal.     Palpations: Abdomen is soft.  Musculoskeletal:     Cervical back: Rigidity present.  Skin:    General: Skin is warm.  Neurological:     Mental Status: He is alert. Mental status is at baseline.     Gait: Gait normal.  Psychiatric:        Mood and Affect: Mood normal.      Results for orders placed or performed in visit on 06/04/23  Basic metabolic panel  Result Value Ref Range   Glucose 190 (H) 70 - 99 mg/dL   BUN 22 6 - 24 mg/dL   Creatinine, Ser 6.29 (H) 0.76 - 1.27 mg/dL   eGFR 53 (L) >52 WU/XLK/4.40   BUN/Creatinine Ratio 14 9 - 20   Sodium 139 134  - 144 mmol/L   Potassium 4.9 3.5 - 5.2 mmol/L   Chloride 100 96 - 106 mmol/L   CO2 24 20 - 29 mmol/L   Calcium 9.1 8.7 - 10.2 mg/dL       Assessment & Plan:   Anxiety/grief he can increase cymbalta, do grief counseling - med doses adjusted and sent into pharmacy, [facility] local resources printed and give to pt.    ICD-10-CM   1. Anxiety  F41.9    dose adjustment on cymbalta, counseling/resources give to pt, supportive listening    2. Fatigue, unspecified type  R53.83 Comprehensive metabolic panel    3. Chest tightness  R07.89    likely multifactorial with several chronic conditions - pulm/cardiac/GI seeing all specialists and appt later today with cardiology, recent ED visit reviewed    4. Grief reaction  F43.21    referred to grief counseling    5. COPD with asthma  J44.89     6. Epigastric pain  R10.13 DISCONTINUED: pantoprazole (PROTONIX) 40 MG tablet   refill on PPI    7. Idiopathic chronic gout of multiple sites without tophus  M1A.09X0 allopurinol (ZYLOPRIM) 300 MG tablet   meds refilled    8. Other chronic pain  G89.29    adjusted lyrica, we can also try to increase cymbalta if needed, referral to PM&R    9. S/P cervical spinal fusion  Z98.1    continued chronic pain    10. Chronic back pain, unspecified back location, unspecified back pain laterality  M54.9    G89.29    per specialists    11. Radiculopathy, cervical  M54.12     12. Uncontrolled diabetes mellitus with hyperglycemia, with long-term current use of insulin (HCC)  E11.65 empagliflozin (JARDIANCE) 10 MG TABS tablet   Z79.4    refill on jardiance (sees endocrinology )      Pt had med changes per cardiology and was supposed to complete his labs and then f/up with them, but his labs were not yet complete - given his mutliple sx we got CMP done stat at hospital today so its resulted for when he sees cardiology this afternoon.  They are managing HTN, CHF and had recently adjusted diuretics/potassium  etc and will be rechecking him today  pt encouraged to continued close f/up with GI and pulm with continued poorly controlled chest tightness sx Frequent ED work-ups and extensive cardiac testing is reassuring  He does suspect some of his grief/stress and chronic pain may also be related to his CP/chest tightness sx and HTN and he was give some local resources and meds adjusted and refilled today   Danelle Berry, PA-C 06/21/23 10:09 AM

## 2023-06-21 NOTE — Patient Instructions (Signed)
Medication Instructions:  Your physician recommends the following medication changes.  INCREASE: Coreg 12.5 mg daily  *If you need a refill on your cardiac medications before your next appointment, please call your pharmacy*  Lab Work: NONE If you have labs (blood work) drawn today and your tests are completely normal, you will receive your results only by: MyChart Message (if you have MyChart) OR A paper copy in the mail If you have any lab test that is abnormal or we need to change your treatment, we will call you to review the results.  Testing/Procedures: NONE  Follow-Up: At [facility], you and your health needs are our priority.  As part of our continuing mission to provide you with exceptional heart care, we have created designated Provider Care Teams.  These Care Teams include your primary Cardiologist (physician) and Advanced Practice Providers (APPs -  Physician Assistants and Nurse Practitioners) who all work together to provide you with the care you need, when you need it.  We recommend signing up for the patient portal called "MyChart".  Sign up information is provided on this After Visit Summary.  MyChart is used to connect with patients for Virtual Visits (Telemedicine).  Patients are able to view lab/test results, encounter notes, upcoming appointments, etc.  Non-urgent messages can be sent to your provider as well.   To learn more about what you can do with MyChart, go to [email or web address].    Your next appointment:   2 month(s)  Provider:   You may see Debbe Odea, MD or one of the following Advanced Practice Providers on your designated Care Team:   Eula Listen, [place]

## 2023-06-21 NOTE — Patient Instructions (Addendum)
Free grief counseling is available at [facility] in [place] One-On-One Grief Counseling for Adults Talking about the loss of a loved one and the adjustment issues involved can be helpful to you and others who grieve. The professional, licensed staff of [facility] can provide one-on-one counseling to you or your family members. Appointments are available in [place] and [place].  [phone]

## 2023-06-24 ENCOUNTER — Other Ambulatory Visit: Payer: Self-pay

## 2023-06-24 DIAGNOSIS — E1165 Type 2 diabetes mellitus with hyperglycemia: Secondary | ICD-10-CM

## 2023-06-24 MED ORDER — DULOXETINE HCL 30 MG PO CPEP
ORAL_CAPSULE | ORAL | 1 refills | Status: DC
Start: 2023-06-24 — End: 2023-11-11

## 2023-06-24 NOTE — Group Note (Deleted)

## 2023-06-24 NOTE — Telephone Encounter (Signed)
Endo referral placed. 

## 2023-07-01 ENCOUNTER — Encounter: Payer: Self-pay | Admitting: Family Medicine

## 2023-07-01 DIAGNOSIS — Z981 Arthrodesis status: Secondary | ICD-10-CM

## 2023-07-01 DIAGNOSIS — G959 Disease of spinal cord, unspecified: Secondary | ICD-10-CM

## 2023-07-01 DIAGNOSIS — M5136 Other intervertebral disc degeneration, lumbar region: Secondary | ICD-10-CM

## 2023-07-01 MED ORDER — BACLOFEN 5 MG PO TABS
5.0000 mg | ORAL_TABLET | Freq: Three times a day (TID) | ORAL | 0 refills | Status: DC | PRN
Start: 2023-07-01 — End: 2023-07-11

## 2023-07-09 ENCOUNTER — Encounter: Payer: Self-pay | Admitting: Pharmacy Technician

## 2023-07-09 ENCOUNTER — Other Ambulatory Visit: Payer: Self-pay | Admitting: Family

## 2023-07-09 ENCOUNTER — Ambulatory Visit: Payer: Medicaid Other | Admitting: Family

## 2023-07-09 ENCOUNTER — Ambulatory Visit
Admission: RE | Admit: 2023-07-09 | Discharge: 2023-07-09 | Disposition: A | Discharge status: Home / Self Care | Payer: Medicaid Other | Source: Ambulatory Visit | Attending: Family | Admitting: Family

## 2023-07-09 ENCOUNTER — Encounter: Payer: Self-pay | Admitting: Family

## 2023-07-09 VITALS — BP 154/77 | HR 68 | Temp 98.0°F | Resp 16

## 2023-07-09 VITALS — BP 168/65 | HR 72 | Wt 231.0 lb

## 2023-07-09 DIAGNOSIS — I2510 Atherosclerotic heart disease of native coronary artery without angina pectoris: Secondary | ICD-10-CM | POA: Diagnosis not present

## 2023-07-09 DIAGNOSIS — Z794 Long term (current) use of insulin: Secondary | ICD-10-CM

## 2023-07-09 DIAGNOSIS — F17210 Nicotine dependence, cigarettes, uncomplicated: Secondary | ICD-10-CM | POA: Diagnosis not present

## 2023-07-09 DIAGNOSIS — I5032 Chronic diastolic (congestive) heart failure: Secondary | ICD-10-CM | POA: Insufficient documentation

## 2023-07-09 DIAGNOSIS — J45909 Unspecified asthma, uncomplicated: Secondary | ICD-10-CM | POA: Diagnosis not present

## 2023-07-09 DIAGNOSIS — R079 Chest pain, unspecified: Secondary | ICD-10-CM

## 2023-07-09 DIAGNOSIS — E782 Mixed hyperlipidemia: Secondary | ICD-10-CM | POA: Diagnosis not present

## 2023-07-09 DIAGNOSIS — N189 Chronic kidney disease, unspecified: Secondary | ICD-10-CM | POA: Insufficient documentation

## 2023-07-09 DIAGNOSIS — I428 Other cardiomyopathies: Secondary | ICD-10-CM | POA: Diagnosis not present

## 2023-07-09 DIAGNOSIS — E785 Hyperlipidemia, unspecified: Secondary | ICD-10-CM | POA: Diagnosis not present

## 2023-07-09 DIAGNOSIS — E1122 Type 2 diabetes mellitus with diabetic chronic kidney disease: Secondary | ICD-10-CM | POA: Diagnosis not present

## 2023-07-09 DIAGNOSIS — E119 Type 2 diabetes mellitus without complications: Secondary | ICD-10-CM

## 2023-07-09 DIAGNOSIS — I130 Hypertensive heart and chronic kidney disease with heart failure and stage 1 through stage 4 chronic kidney disease, or unspecified chronic kidney disease: Secondary | ICD-10-CM | POA: Diagnosis not present

## 2023-07-09 DIAGNOSIS — I200 Unstable angina: Secondary | ICD-10-CM

## 2023-07-09 DIAGNOSIS — R9431 Abnormal electrocardiogram [ECG] [EKG]: Secondary | ICD-10-CM | POA: Diagnosis not present

## 2023-07-09 DIAGNOSIS — J449 Chronic obstructive pulmonary disease, unspecified: Secondary | ICD-10-CM | POA: Insufficient documentation

## 2023-07-09 DIAGNOSIS — I10 Essential (primary) hypertension: Secondary | ICD-10-CM | POA: Diagnosis not present

## 2023-07-09 DIAGNOSIS — R0789 Other chest pain: Secondary | ICD-10-CM

## 2023-07-09 LAB — BASIC METABOLIC PANEL
Anion gap: 11 (ref 5–15)
BUN: 16 mg/dL (ref 6–20)
CO2: 24 mmol/L (ref 22–32)
Calcium: 8.9 mg/dL (ref 8.9–10.3)
Chloride: 102 mmol/L (ref 98–111)
Creatinine, Ser: 1.24 mg/dL (ref 0.61–1.24)
GFR, Estimated: >60 mL/min (ref >60)
Glucose, Bld: 203 mg/dL — ABNORMAL HIGH (ref 70–99)
Potassium: 4 mmol/L (ref 3.5–5.1)
Sodium: 137 mmol/L (ref 135–145)

## 2023-07-09 LAB — BRAIN NATRIURETIC PEPTIDE: B Natriuretic Peptide: 127.8 pg/mL — ABNORMAL HIGH (ref 0.0–100.0)

## 2023-07-09 LAB — CK: Total CK: 155 U/L (ref 49–397)

## 2023-07-09 LAB — TROPONIN I (HIGH SENSITIVITY): Troponin I (High Sensitivity): 14 ng/L (ref <18)

## 2023-07-09 MED ORDER — POTASSIUM CHLORIDE CRYS ER 20 MEQ PO TBCR
EXTENDED_RELEASE_TABLET | ORAL | Status: AC
  Filled 2023-07-09: qty 2

## 2023-07-09 MED ORDER — FUROSEMIDE 10 MG/ML IJ SOLN
80.0000 mg | Freq: Once | INTRAMUSCULAR | Status: AC
  Administered 2023-07-09: 80 mg via INTRAVENOUS

## 2023-07-09 MED ORDER — FUROSEMIDE 10 MG/ML IJ SOLN
INTRAMUSCULAR | Status: AC
  Filled 2023-07-09: qty 8

## 2023-07-09 MED ORDER — POTASSIUM CHLORIDE CRYS ER 20 MEQ PO TBCR
40.0000 meq | EXTENDED_RELEASE_TABLET | Freq: Once | ORAL | Status: AC
  Administered 2023-07-09: 40 meq via ORAL

## 2023-07-09 NOTE — Consult Note (Signed)
[facility] - HEART FAILURE CLINIC - PHARMACIST COUNSELING NOTE  Guideline-Directed Medical Therapy/Evidence Based Medicine  ACE/ARB/ARNI: Lisinopril 40 mg daily Beta Blocker: Carvedilol 6.25 mg twice daily Aldosterone Antagonist: Spironolactone 25 mg daily Diuretic: Torsemide 40 mg daily and an additional 40 mg on M-W-F SGLT2i: Empagliflozin 10 mg daily  Adherence Assessment  Do you ever forget to take your medication? [] Yes [x] No  Do you ever skip doses due to side effects? [] Yes [x] No  Do you have trouble affording your medicines? [] Yes [x] No  Are you ever unable to pick up your medication due to transportation difficulties? [] Yes [x] No  Do you ever stop taking your medications because you don't believe they are helping? [] Yes [x] No  Do you check your weight daily? [] Yes [x] No   Adherence strategy: Gets monthly bubble packs from his local pharmacy  Barriers to obtaining medications: Cost  Vital signs: HR 68-72, BP 154-168/65-77, weight 104.8 kg ECHO: Date 05/28/2023 , EF 50-55% Cath: Date 05/29/2023 , Moderate, nonobstructive CAD. Mildly elevated L and R filling pressures. Mild pulmHTN with mild AS.     Latest Ref Rng & Units 06/21/2023   11:01 AM 06/04/2023    9:39 AM 05/30/2023    5:51 AM  BMP  Glucose 70 - 99 mg/dL 829  562  130   BUN 6 - 20 mg/dL 18  22  21    Creatinine 0.61 - 1.24 mg/dL 8.65  7.84  6.96   BUN/Creat Ratio 9 - 20  14    Sodium 135 - 145 mmol/L 136  139  139   Potassium 3.5 - 5.1 mmol/L 4.2  4.9  4.3   Chloride 98 - 111 mmol/L 98  100  106   CO2 22 - 32 mmol/L 27  24  24    Calcium 8.9 - 10.3 mg/dL 9.4  9.1  8.3     Past Medical History:  Diagnosis Date   Bulging of cervical intervertebral disc    CHF (congestive heart failure) (HCC)    Diabetes mellitus without complication (HCC)    Gout    High cholesterol    Hypertension    Stress due to family tension 04/15/2019    ASSESSMENT 57 year old male who presents to the HF  clinic for follow-up regarding his HFpEF. Patient has no complaints or questions for pharmacist today. BP high today, ReDS reading elevated at 43% and he is up 9 lbs from cardiology visit two weeks ago but patient not complaining of fluid overload symptoms. No notable changes to medications. Patient taking metformin ER 1000 mg once daily for tolerability reasons and reports that his usual insulin lispro requirements at mealtime are 8 units.  Recent ED Visit (past 6 months):  Date -  05/27/2023, CC - Chest pain  03/23/2023, CC - High blood sugar  03/22/2023, CC - Chest pain 03/09/2023, CC - Chest pain 03/01/2023, CC - Neck sprain  PLAN HFpEF (05/2023 EF 50-55%) / HTN Continue empagliflozin 10 mg daily, lisinopril 40 mg daily, carvedilol 6.25 mg twice daily, and spironolactone 25 mg daily Consider increasing empagliflozin to 25 mg daily to help with additional BG control (see diabetes section below) If changing lisinopril to ARB like was discussed, would recommend doing so with additional instructions added to prescription: "To start with next pill pack," to prevent disruptions to current pill pack Continue current torsemide regimen of 40 mg daily with an additional 40 mg daily on M-W-F DM 03/21/2023 A1c 10.2% Continue metformin ER at decreased dose of  1000 mg daily, empagliflozin 10 mg daily, dulaglutide 3 mg weekly, insulin glargine daily, and insulin lispro three times daily with meals Consider increasing empagliflozin to 25 mg daily to help with additional BG control CAD / HLD 05/2022 LDL 135 Continue rosuvastatin 40 mg daily and ezetimibe 10 mg daily   Time spent: 10 minutes  Will M. Dareen Piano, PharmD Clinical Pharmacist 07/09/2023 11:17 AM     Current Outpatient Medications:    allopurinol (ZYLOPRIM) 300 MG tablet, TAKE ONE TABLET BY MOUTH DAILY WITH 100 MG TABLET. FOR A TOTAL OF 400 MG DAILY FOR GOUT, Disp: 90 tablet, Rfl: 1   aspirin EC 81 MG tablet, Take 1 tablet (81 mg total) by mouth  daily. Swallow whole., Disp: 60 tablet, Rfl: 0   Baclofen 5 MG TABS, Take 1-2 tablets (5-10 mg total) by mouth 3 (three) times daily as needed (msk pain or spasms)., Disp: 90 tablet, Rfl: 0   carvedilol (COREG) 12.5 MG tablet, Take 1 tablet (12.5 mg total) by mouth 2 (two) times daily with a meal., Disp: 180 tablet, Rfl: 3   DULoxetine (CYMBALTA) 30 MG capsule, Take 60 mg (2 capsules) poqam and 30 mg (1 capsule) poqpm daily, Disp: 270 capsule, Rfl: 1   empagliflozin (JARDIANCE) 10 MG TABS tablet, Take 1 tablet (10 mg total) by mouth daily., Disp: 90 tablet, Rfl: 1   ezetimibe (ZETIA) 10 MG tablet, TAKE ONE TABLET BY MOUTH DAILY, Disp: 90 tablet, Rfl: 3   fluticasone (FLONASE) 50 MCG/ACT nasal spray, Place 1 spray into both nostrils daily. (Patient taking differently: Place 1 spray into both nostrils daily as needed for rhinitis or allergies.), Disp: 18.2 mL, Rfl: 6   insulin glargine (LANTUS SOLOSTAR) 100 UNIT/ML Solostar Pen, Inject 20 Units into the skin at bedtime., Disp: , Rfl:    insulin lispro (HUMALOG) 100 UNIT/ML KwikPen, Inject 1-45 Units into the skin 3 (three) times daily. Sliding scale up to 45 units per injection, Disp: , Rfl:    ipratropium-albuterol (DUONEB) 0.5-2.5 (3) MG/3ML SOLN, Take 3 mLs by nebulization every 6 (six) hours as needed (SOB wheeze cough)., Disp: 180 mL, Rfl: 1   levocetirizine (XYZAL) 5 MG tablet, TAKE ONE TABLET BY MOUTH EVERY EVENING, Disp: 90 tablet, Rfl: 1   lisinopril (ZESTRIL) 40 MG tablet, Take 1 tablet (40 mg total) by mouth daily., Disp: 90 tablet, Rfl: 3   metFORMIN (GLUCOPHAGE) 1000 MG tablet, TAKE ONE TABLET BY MOUTH TWO TIMES A DAY6 WITHA MEAL (Patient taking differently: Take 1,000 mg by mouth at bedtime.), Disp: 180 tablet, Rfl: 3   nitroGLYCERIN (NITROSTAT) 0.4 MG SL tablet, Place 1 tablet (0.4 mg total) under the tongue every 5 (five) minutes as needed for chest pain., Disp: 15 tablet, Rfl: 0   pregabalin (LYRICA) 50 MG capsule, Take 1 capsule (50 mg  total) by mouth 2 (two) times daily. (Patient taking differently: Take 50 mg by mouth 2 (two) times daily. prn), Disp: 60 capsule, Rfl: 2   rosuvastatin (CRESTOR) 40 MG tablet, TAKE ONE TABLET BY MOUTH DAILY, Disp: 90 tablet, Rfl: 3   spironolactone (ALDACTONE) 25 MG tablet, Take 1 tablet (25 mg total) by mouth daily., Disp: 90 tablet, Rfl: 1   Tiotropium Bromide Monohydrate (SPIRIVA RESPIMAT) 2.5 MCG/ACT AERS, Inhale 2 puffs into the lungs daily., Disp: 30 each, Rfl: 11   torsemide 40 MG TABS, Take 40 mg by mouth every Monday, Wednesday, and Friday. In addition to your daily 40mg  tablet taken every day, Disp: 30 tablet, Rfl: 0  TRULICITY 3 MG/0.5ML SOPN, Inject 3 mg into the skin once a week. SATURDAYS, Disp: , Rfl:    VENTOLIN HFA 108 (90 Base) MCG/ACT inhaler, INHALE TWO PUFFS INTO THE LUNGS EVERY SIX HOURS AS NEEDED FOR WHEEZING OR SHORTNESS OF BREATH, Disp: 18 each, Rfl: 2 No current facility-administered medications for this visit.  Facility-Administered Medications Ordered in Other Visits:    albuterol (PROVENTIL) (2.5 MG/3ML) 0.083% nebulizer solution 2.5 mg, 2.5 mg, Nebulization, Once, Raechel Chute, MD

## 2023-07-09 NOTE — Progress Notes (Signed)
PCP: Danelle Berry, PA-C (last seen 09/24) Primary Cardiologist: Debbe Odea, MD (last seen 09/24)  HPI:   Cory Rivas is a 57 y/o male with a history of DM, hyperlipidemia, tracheobronchomalacia, nonobstructive CAD, HTN, gout, COPD, asthma, CKD, dyslipidemia, tobacco use and chronic heart failure.   Admitted 08/23/22 due to dyspnea, productive cough of brown sputum, mild sore throat, fatigue, dizziness and chest soreness. Steroid burst given. Initially given IV lasix with transition to oral diuretics. Lisinopril held due to AKI. Discharged after 2 days. Admitted 11/22/22 due to acute on chronic HF. Weaned off of bipap.Was in the ED 03/01/23 due to neck pain after falling off his tractor after he hit a stump. He landed on his right side.Head CT/ cervical spine xrays were negative. Was in the ED 03/09/23 due to atypical chest pain. Chest CTA negative for PE. Was in the ED 03/22/23 due to nonspecific chest pain. Workup negative. Was in the ED 03/23/23 due to hyperglycemia with glucose of >400.    Admitted 05/27/23 due to acute onset of chest pain, shortness of breath and diaphoresis. Blood pressure was significantly elevated to 204/102 on admission. Troponins were mildly elevated up to 26.  Cardiology was consulted and he underwent cardiac catheterization 8/14 which revealed non-obstructive coronary artery disease. Meds adjusted. Chest x-ray without acute cardiopulmonary process. Echo on 05/28/2023 showed an EF of 50 to 55%, no regional wall motion abnormalities, moderate LVH, grade 3 diastolic dysfunction, elevated left atrial pressure, normal RV systolic function and ventricular cavity size, mildly dilated left atrium, mild mitral regurgitation, aortic valve sclerosis without evidence of stenosis, and an estimated right atrial pressure of 3 mmHg.   Echo 08/23/22: EF of 60-65% along with mild LVH & mild Cory.  Echo 05/28/23: EF 50-55% with moderate LVH, Grade III DD and mild Cory  RHC/LHC 05/29/23: Moderate,  nonobstructive coronary artery disease with sequential 50-60% ostial and proximal LAD stenoses that are not hemodynamically significant (RFR = 0.93).  There is also a 40% ostial stenosis of a large first OM branch.  Nondominant RCA is without significant disease. Upper normal to mildly elevated left and right heart filling pressures. Mild pulmonary hypertension. Borderline low Fick cardiac output/index. Mild aortic stenosis.  He presents today for a HF follow-up visit with a chief complaint of moderate SOB with minimal exertion. Chronic in nature. Has associated fatigue, palpitations, intermittent dizziness, abdominal distention, abdominal soreness in upper abdomen and gradual weight gain. Has been having constant chest pain mid sternal for the last 2 days. Feels like someone is pushing on chest and worsens when lays back or flat. Denies radiation of pain anywhere, nausea, sweating or pedal edema. Reports slight improvement of pain when he sits up. No change in his diet/ fluid intake. Has been taking all his medications as prescribed and hadn't run out of anything.    At last visit, spironolactone 25mg  was started and he says that he's been tolerating that without any side effects that he's aware of.   ROS: All systems negative except as listed in HPI, PMH and Problem List.  SH:  Social History   Socioeconomic History   Marital status: Divorced    Spouse name: Not on file   Number of children: 0   Years of education: 9   Highest education level: 9th grade  Occupational History   Occupation: disability  Tobacco Use   Smoking status: Some Days    Current packs/day: 0.00    Average packs/day: 1 pack/day for 37.0 years (37.0 ttl  pk-yrs)    Types: Cigarettes    Start date: 10/19/1989    Last attempt to quit: 08/20/2022    Years since quitting: 0.8   Smokeless tobacco: Never   Tobacco comments:    30+ years hx smoking 2 ppd, stopped smoking briefly in April 2022 for a surgery and then  restarted smoking about 1/2 ppd last 4-5 months         5-6cigs daily- 04/10/2023  Vaping Use   Vaping status: Never Used  Substance and Sexual Activity   Alcohol use: No   Drug use: No   Sexual activity: Not Currently    Partners: Female  Other Topics Concern   Not on file  Social History Narrative   Lives with sister Cory Rivas, brother in law Cory Rivas), and his mother - Cory Rivas   Social Determinants of Health   Financial Resource Strain: Medium Risk (12/06/2022)   Overall Financial Resource Strain (CARDIA)    Difficulty of Paying Living Expenses: Somewhat hard  Food Insecurity: Food Insecurity Present (05/28/2023)   Hunger Vital Sign    Worried About Running Out of Food in the Last Year: Often true    Ran Out of Food in the Last Year: Often true  Transportation Needs: No Transportation Needs (05/28/2023)   PRAPARE - Administrator, Civil Service (Medical): No    Lack of Transportation (Non-Medical): No  Physical Activity: Insufficiently Active (12/06/2022)   Exercise Vital Sign    Days of Exercise per Week: 3 days    Minutes of Exercise per Session: 10 min  Stress: No Stress Concern Present (12/06/2022)   [employer] of Occupational Health - Occupational Stress Questionnaire    Feeling of Stress : Only a little  Social Connections: Socially Isolated (12/06/2022)   Social Connection and Isolation Panel [NHANES]    Frequency of Communication with Friends and Family: More than three times a week    Frequency of Social Gatherings with Friends and Family: Twice a week    Attends Religious Services: Never    Database administrator or Organizations: No    Attends Banker Meetings: Never    Marital Status: Divorced  Catering manager Violence: Not At Risk (05/28/2023)   Humiliation, Afraid, Rape, and Kick questionnaire    Fear of Current or Ex-Partner: No    Emotionally Abused: No    Physically Abused: No    Sexually Abused: No    FH:  Family History   Problem Relation Age of Onset   Heart disease Mother    Diabetes Mother    Hyperlipidemia Mother    Hypertension Mother    Heart attack Mother    Lung cancer Father    Hypertension Father    Stroke Father    AAA (abdominal aortic aneurysm) Maternal Grandmother    Heart attack Maternal Grandmother    Diabetes Maternal Grandfather     Past Medical History:  Diagnosis Date   Bulging of cervical intervertebral disc    CHF (congestive heart failure) (HCC)    Diabetes mellitus without complication (HCC)    Gout    High cholesterol    Hypertension    Stress due to family tension 04/15/2019    Current Outpatient Medications  Medication Sig Dispense Refill   allopurinol (ZYLOPRIM) 300 MG tablet TAKE ONE TABLET BY MOUTH DAILY WITH 100 MG TABLET. FOR A TOTAL OF 400 MG DAILY FOR GOUT 90 tablet 1   aspirin EC 81 MG tablet Take 1 tablet (  81 mg total) by mouth daily. Swallow whole. 60 tablet 0   Baclofen 5 MG TABS Take 1-2 tablets (5-10 mg total) by mouth 3 (three) times daily as needed (msk pain or spasms). 90 tablet 0   carvedilol (COREG) 12.5 MG tablet Take 1 tablet (12.5 mg total) by mouth 2 (two) times daily with a meal. 180 tablet 3   DULoxetine (CYMBALTA) 30 MG capsule Take 60 mg (2 capsules) poqam and 30 mg (1 capsule) poqpm daily 270 capsule 1   empagliflozin (JARDIANCE) 10 MG TABS tablet Take 1 tablet (10 mg total) by mouth daily. 90 tablet 1   ezetimibe (ZETIA) 10 MG tablet TAKE ONE TABLET BY MOUTH DAILY 90 tablet 3   fluticasone (FLONASE) 50 MCG/ACT nasal spray Place 1 spray into both nostrils daily. (Patient taking differently: Place 1 spray into both nostrils daily as needed for rhinitis or allergies.) 18.2 mL 6   insulin glargine (LANTUS SOLOSTAR) 100 UNIT/ML Solostar Pen Inject 20 Units into the skin at bedtime.     insulin lispro (HUMALOG) 100 UNIT/ML KwikPen Inject 1-45 Units into the skin 3 (three) times daily. Sliding scale up to 45 units per injection      ipratropium-albuterol (DUONEB) 0.5-2.5 (3) MG/3ML SOLN Take 3 mLs by nebulization every 6 (six) hours as needed (SOB wheeze cough). 180 mL 1   levocetirizine (XYZAL) 5 MG tablet TAKE ONE TABLET BY MOUTH EVERY EVENING 90 tablet 1   lisinopril (ZESTRIL) 40 MG tablet Take 1 tablet (40 mg total) by mouth daily. 90 tablet 3   metFORMIN (GLUCOPHAGE) 1000 MG tablet TAKE ONE TABLET BY MOUTH TWO TIMES A DAY6 WITHA MEAL (Patient taking differently: Take 1,000 mg by mouth at bedtime.) 180 tablet 3   nitroGLYCERIN (NITROSTAT) 0.4 MG SL tablet Place 1 tablet (0.4 mg total) under the tongue every 5 (five) minutes as needed for chest pain. 15 tablet 0   pregabalin (LYRICA) 50 MG capsule Take 1 capsule (50 mg total) by mouth 2 (two) times daily. (Patient taking differently: Take 50 mg by mouth 2 (two) times daily. prn) 60 capsule 2   rosuvastatin (CRESTOR) 40 MG tablet TAKE ONE TABLET BY MOUTH DAILY 90 tablet 3   spironolactone (ALDACTONE) 25 MG tablet Take 1 tablet (25 mg total) by mouth daily. 90 tablet 1   Tiotropium Bromide Monohydrate (SPIRIVA RESPIMAT) 2.5 MCG/ACT AERS Inhale 2 puffs into the lungs daily. 30 each 11   torsemide 40 MG TABS Take 40 mg by mouth every Monday, Wednesday, and Friday. In addition to your daily 40mg  tablet taken every day 30 tablet 0   TRULICITY 3 MG/0.5ML SOPN Inject 3 mg into the skin once a week. SATURDAYS     VENTOLIN HFA 108 (90 Base) MCG/ACT inhaler INHALE TWO PUFFS INTO THE LUNGS EVERY SIX HOURS AS NEEDED FOR WHEEZING OR SHORTNESS OF BREATH 18 each 2   No current facility-administered medications for this visit.   Facility-Administered Medications Ordered in Other Visits  Medication Dose Route Frequency Provider Last Rate Last Admin   albuterol (PROVENTIL) (2.5 MG/3ML) 0.083% nebulizer solution 2.5 mg  2.5 mg Nebulization Once Raechel Chute, MD       Vitals:   07/09/23 0840  BP: (!) 168/65  Pulse: 72  SpO2: 96%  Weight: 231 lb (104.8 kg)   Wt Readings from Last 3  Encounters:  07/09/23 231 lb (104.8 kg)  06/21/23 222 lb 3.2 oz (100.8 kg)  06/21/23 224 lb 12.8 oz (102 kg)   Lab Results  Component Value Date   CREATININE 1.34 (H) 06/21/2023   CREATININE 1.53 (H) 06/04/2023   CREATININE 1.46 (H) 05/30/2023     PHYSICAL EXAM:  General:  Well appearing. No resp difficulty HEENT: negative Neck: supple. JVP flat. No lymphadenopathy or thryomegaly appreciated. Cor: PMI normal. Regular rate & rhythm. No rubs, gallops. 2/6 systolic murmur at LSB. Lungs: clear Abdomen: soft, nontender, distended. No hepatosplenomegaly. No bruits or masses.  Extremities: no cyanosis, clubbing, rash, edema Neuro: alert & oriented x 3, cranial nerves grossly intact. Moves all 4 extremities w/o difficulty. Affect pleasant. Skin: bruising noted on right forearm, + pulses No reproducible musculoskeletal pain  ECG: shows NSR with PAC's/ blocked PAC's HR 77  ReDs: 43%   ASSESSMENT & PLAN:  1: NICM with preserved ejection fraction with LVH- - suscpect due to HTN and OSA - NYHA class III - fluid overloaded with worsening symptoms, weight gain and elevated ReDs reading - weighing daily; reminded to call for an overnight weight gain of >2 pounds or a weekly weight gain of > 5 pounds - weight up 8 pounds from last visit here 1 month ago (up 9 pounds from cardiology visit 2 weeks ago) - ReDs 43% today - send for 80mg  IV lasix/ PO potassium today - BMP/BNP today - echo 08/23/22: EF of 60-65% along with mild LVH & mild Cory. - echo 05/28/23: EF 50-55% with moderate LVH, Grade III DD and mild Cory - continue jardiance 10mg  QD - continue torsemide 40mg  daily with additional 40mg  on M, W, F  - continue potassium to BID - continue lisinopril 40mg  daily  - continue carvedilol 6.25mg  BID - continue spironolactone 25mg  daily - drinking less fluids and is trying to keep it closer to 60-64 ounces - not adding salt but continues to eat higher sodium foods  - BNP 05/27/23  was 199.1 - PharmD reconciled meds w/ patient  2: HTN- - BP 168/65 - saw PCP Angelica Chessman) 09/24  - BMP 06/21/23 reviewed and showed sodium 136, potassium 4.2, creatinine 1.34 and GFR >60  3: DM- - A1c 05/02/23 was 10.6% - currently on trulicity, metformin and insulin - saw endocrinology (Rhinehart) 07/24 - 03/21/23 microalbumin/ creat ratio was 1338  4: Reactive airway disease- - saw pulmonology (Dgayli) 06/24 - using spiriva  - continues to not smoke - wearing CPAP most nights   5: Hyperlipidemia- - continue ezetimibe 10mg  daily - continue rosuvastatin 40mg  daily - lipid panel 03/21/23 showed LDL 130, triglycerides 536  6: CAD- - saw cardiology (Dunn) 09/24 - continue ezetimibe 10mg  daily - continue rosuvastatin 40mg  daily - RHC/LHC 05/29/23: Moderate, nonobstructive coronary artery disease with sequential 50-60% ostial and proximal LAD stenoses that are not hemodynamically significant (RFR = 0.93).  There is also a 40% ostial stenosis of a large first OM branch.  Nondominant RCA is without significant disease. Upper normal to mildly elevated left and right heart filling pressures. Mild pulmonary hypertension. Borderline low Fick cardiac output/index. Mild aortic stenosis.  7: Chest pain- - reports being constant for last 2 days and worse with laying down flat - improves when sitting up - EKG showed NSR with PAC's/ blocked PAC's - reached out to cardiology and they agree that most likely his pain is due to fluid overload as cath 6 weeks ago showed nonobstructive CAD  Return in 2 days, sooner if needed.

## 2023-07-09 NOTE — Progress Notes (Signed)
   07/09/23 0850  ReDS Vest / Clip  Station Marker C  Ruler Value 38  ReDS Value Range (!) > 40  ReDS Actual Value 43

## 2023-07-11 ENCOUNTER — Encounter: Payer: Self-pay | Admitting: Family

## 2023-07-11 ENCOUNTER — Ambulatory Visit: Payer: Medicaid Other | Attending: Family | Admitting: Family

## 2023-07-11 ENCOUNTER — Ambulatory Visit (INDEPENDENT_AMBULATORY_CARE_PROVIDER_SITE_OTHER): Payer: Medicaid Other

## 2023-07-11 ENCOUNTER — Other Ambulatory Visit: Payer: Self-pay

## 2023-07-11 VITALS — BP 169/95 | HR 62 | Wt 227.0 lb

## 2023-07-11 DIAGNOSIS — I2510 Atherosclerotic heart disease of native coronary artery without angina pectoris: Secondary | ICD-10-CM | POA: Diagnosis not present

## 2023-07-11 DIAGNOSIS — R079 Chest pain, unspecified: Secondary | ICD-10-CM | POA: Diagnosis not present

## 2023-07-11 DIAGNOSIS — I428 Other cardiomyopathies: Secondary | ICD-10-CM | POA: Diagnosis not present

## 2023-07-11 DIAGNOSIS — Z23 Encounter for immunization: Secondary | ICD-10-CM | POA: Diagnosis not present

## 2023-07-11 DIAGNOSIS — E785 Hyperlipidemia, unspecified: Secondary | ICD-10-CM | POA: Insufficient documentation

## 2023-07-11 DIAGNOSIS — J45909 Unspecified asthma, uncomplicated: Secondary | ICD-10-CM | POA: Diagnosis not present

## 2023-07-11 DIAGNOSIS — Z7984 Long term (current) use of oral hypoglycemic drugs: Secondary | ICD-10-CM | POA: Diagnosis not present

## 2023-07-11 DIAGNOSIS — I10 Essential (primary) hypertension: Secondary | ICD-10-CM

## 2023-07-11 DIAGNOSIS — N189 Chronic kidney disease, unspecified: Secondary | ICD-10-CM | POA: Diagnosis not present

## 2023-07-11 DIAGNOSIS — E782 Mixed hyperlipidemia: Secondary | ICD-10-CM

## 2023-07-11 DIAGNOSIS — Z794 Long term (current) use of insulin: Secondary | ICD-10-CM | POA: Diagnosis not present

## 2023-07-11 DIAGNOSIS — I5032 Chronic diastolic (congestive) heart failure: Secondary | ICD-10-CM

## 2023-07-11 DIAGNOSIS — Z7985 Long-term (current) use of injectable non-insulin antidiabetic drugs: Secondary | ICD-10-CM | POA: Insufficient documentation

## 2023-07-11 DIAGNOSIS — I130 Hypertensive heart and chronic kidney disease with heart failure and stage 1 through stage 4 chronic kidney disease, or unspecified chronic kidney disease: Secondary | ICD-10-CM | POA: Diagnosis not present

## 2023-07-11 DIAGNOSIS — E119 Type 2 diabetes mellitus without complications: Secondary | ICD-10-CM

## 2023-07-11 DIAGNOSIS — E1165 Type 2 diabetes mellitus with hyperglycemia: Secondary | ICD-10-CM | POA: Diagnosis not present

## 2023-07-11 DIAGNOSIS — I509 Heart failure, unspecified: Secondary | ICD-10-CM | POA: Diagnosis present

## 2023-07-11 DIAGNOSIS — E1122 Type 2 diabetes mellitus with diabetic chronic kidney disease: Secondary | ICD-10-CM | POA: Insufficient documentation

## 2023-07-11 MED ORDER — TORSEMIDE 40 MG PO TABS: 40.0000 mg | ORAL_TABLET | Freq: Every morning | ORAL | 3 refills | Status: DC

## 2023-07-11 NOTE — Patient Instructions (Signed)
It was good to see you today, keep up the good work!

## 2023-07-11 NOTE — Progress Notes (Signed)
PCP: Danelle Berry, PA-C (last seen 09/24) Primary Cardiologist: Debbe Odea, MD (last seen 09/24)  HPI:   Mr Regalbuto is a 57 y/o male with a history of DM, hyperlipidemia, tracheobronchomalacia, nonobstructive CAD, HTN, gout, COPD, asthma, CKD, dyslipidemia, tobacco use and chronic heart failure.   Admitted 08/23/22 due to dyspnea, productive cough of brown sputum, mild sore throat, fatigue, dizziness and chest soreness. Steroid burst given. Initially given IV lasix with transition to oral diuretics. Lisinopril held due to AKI. Discharged after 2 days. Admitted 11/22/22 due to acute on chronic HF. Weaned off of bipap.Was in the ED 03/01/23 due to neck pain after falling off his tractor after he hit a stump. He landed on his right side.Head CT/ cervical spine xrays were negative. Was in the ED 03/09/23 due to atypical chest pain. Chest CTA negative for PE. Was in the ED 03/22/23 due to nonspecific chest pain. Workup negative. Was in the ED 03/23/23 due to hyperglycemia with glucose of >400.    Admitted 05/27/23 due to acute onset of chest pain, shortness of breath and diaphoresis. Blood pressure was significantly elevated to 204/102 on admission. Troponins were mildly elevated up to 26.  Cardiology was consulted and he underwent cardiac catheterization 8/14 which revealed non-obstructive coronary artery disease. Meds adjusted. Chest x-ray without acute cardiopulmonary process. Echo on 05/28/2023 showed an EF of 50 to 55%, no regional wall motion abnormalities, moderate LVH, grade 3 diastolic dysfunction, elevated left atrial pressure, normal RV systolic function and ventricular cavity size, mildly dilated left atrium, mild mitral regurgitation, aortic valve sclerosis without evidence of stenosis, and an estimated right atrial pressure of 3 mmHg.   Echo 08/23/22: EF of 60-65% along with mild LVH & mild MR.  Echo 05/28/23: EF 50-55% with moderate LVH, Grade III DD and mild MR  RHC/LHC 05/29/23: Moderate,  nonobstructive coronary artery disease with sequential 50-60% ostial and proximal LAD stenoses that are not hemodynamically significant (RFR = 0.93).  There is also a 40% ostial stenosis of a large first OM branch.  Nondominant RCA is without significant disease. Upper normal to mildly elevated left and right heart filling pressures. Mild pulmonary hypertension. Borderline low Fick cardiac output/index. Mild aortic stenosis.  He presents today for a HF follow-up visit with a chief complaint of very little fatigue. Chronic in nature and "much better" than when he was here 2 days ago. Denies any other symptoms and specifically denies any shortness of breath, chest pain, palpitations, abdominal distention, pedal edema, weight gain or difficulty sleeping. Says that he walked fast into the office today without any shortness of breath/ chest pain. Sleeping well and is now able to sleep laying flat. Overall he says that he feels "so much better".   At last visit 2 days ago, he was given 80mg  IV lasix/ PO potassium    Has not taken his medication yet today because he hasn't eaten any breakfast.   ROS: All systems negative except as listed in HPI, PMH and Problem List.  SH:  Social History   Socioeconomic History   Marital status: Divorced    Spouse name: Not on file   Number of children: 0   Years of education: 9   Highest education level: 9th grade  Occupational History   Occupation: disability  Tobacco Use   Smoking status: Some Days    Current packs/day: 0.00    Average packs/day: 1 pack/day for 37.0 years (37.0 ttl pk-yrs)    Types: Cigarettes    Start  date: 10/19/1989    Last attempt to quit: 08/20/2022    Years since quitting: 0.8   Smokeless tobacco: Never   Tobacco comments:    30+ years hx smoking 2 ppd, stopped smoking briefly in April 2022 for a surgery and then restarted smoking about 1/2 ppd last 4-5 months         5-6cigs daily- 04/10/2023  Vaping Use   Vaping status:  Never Used  Substance and Sexual Activity   Alcohol use: No   Drug use: No   Sexual activity: Not Currently    Partners: Female  Other Topics Concern   Not on file  Social History Narrative   Lives with sister [name], brother in law [name]), and his mother - Johnjames Oquinn   Social Determinants of Health   Financial Resource Strain: Medium Risk (12/06/2022)   Overall Financial Resource Strain (CARDIA)    Difficulty of Paying Living Expenses: Somewhat hard  Food Insecurity: Food Insecurity Present (05/28/2023)   Hunger Vital Sign    Worried About Running Out of Food in the Last Year: Often true    Ran Out of Food in the Last Year: Often true  Transportation Needs: No Transportation Needs (05/28/2023)   PRAPARE - Administrator, Civil Service (Medical): No    Lack of Transportation (Non-Medical): No  Physical Activity: Insufficiently Active (12/06/2022)   Exercise Vital Sign    Days of Exercise per Week: 3 days    Minutes of Exercise per Session: 10 min  Stress: No Stress Concern Present (12/06/2022)   [employer] of Occupational Health - Occupational Stress Questionnaire    Feeling of Stress : Only a little  Social Connections: Socially Isolated (12/06/2022)   Social Connection and Isolation Panel [NHANES]    Frequency of Communication with Friends and Family: More than three times a week    Frequency of Social Gatherings with Friends and Family: Twice a week    Attends Religious Services: Never    Database administrator or Organizations: No    Attends Banker Meetings: Never    Marital Status: Divorced  Catering manager Violence: Not At Risk (05/28/2023)   Humiliation, Afraid, Rape, and Kick questionnaire    Fear of Current or Ex-Partner: No    Emotionally Abused: No    Physically Abused: No    Sexually Abused: No    FH:  Family History  Problem Relation Age of Onset   Heart disease Mother    Diabetes Mother    Hyperlipidemia Mother     Hypertension Mother    Heart attack Mother    Lung cancer Father    Hypertension Father    Stroke Father    AAA (abdominal aortic aneurysm) Maternal Grandmother    Heart attack Maternal Grandmother    Diabetes Maternal Grandfather     Past Medical History:  Diagnosis Date   Bulging of cervical intervertebral disc    CHF (congestive heart failure) (HCC)    Diabetes mellitus without complication (HCC)    Gout    High cholesterol    Hypertension    Stress due to family tension 04/15/2019    Current Outpatient Medications  Medication Sig Dispense Refill   allopurinol (ZYLOPRIM) 300 MG tablet TAKE ONE TABLET BY MOUTH DAILY WITH 100 MG TABLET. FOR A TOTAL OF 400 MG DAILY FOR GOUT 90 tablet 1   aspirin EC 81 MG tablet Take 1 tablet (81 mg total) by mouth daily. Swallow whole. 60 tablet  0   Baclofen 5 MG TABS Take 1-2 tablets (5-10 mg total) by mouth 3 (three) times daily as needed (msk pain or spasms). 90 tablet 0   carvedilol (COREG) 12.5 MG tablet Take 1 tablet (12.5 mg total) by mouth 2 (two) times daily with a meal. 180 tablet 3   DULoxetine (CYMBALTA) 30 MG capsule Take 60 mg (2 capsules) poqam and 30 mg (1 capsule) poqpm daily 270 capsule 1   empagliflozin (JARDIANCE) 10 MG TABS tablet Take 1 tablet (10 mg total) by mouth daily. 90 tablet 1   ezetimibe (ZETIA) 10 MG tablet TAKE ONE TABLET BY MOUTH DAILY 90 tablet 3   fluticasone (FLONASE) 50 MCG/ACT nasal spray Place 1 spray into both nostrils daily. (Patient taking differently: Place 1 spray into both nostrils daily as needed for rhinitis or allergies.) 18.2 mL 6   insulin glargine (LANTUS SOLOSTAR) 100 UNIT/ML Solostar Pen Inject 20 Units into the skin at bedtime.     insulin lispro (HUMALOG) 100 UNIT/ML KwikPen Inject 1-45 Units into the skin 3 (three) times daily. Sliding scale up to 45 units per injection     ipratropium-albuterol (DUONEB) 0.5-2.5 (3) MG/3ML SOLN Take 3 mLs by nebulization every 6 (six) hours as needed (SOB  wheeze cough). 180 mL 1   levocetirizine (XYZAL) 5 MG tablet TAKE ONE TABLET BY MOUTH EVERY EVENING 90 tablet 1   lisinopril (ZESTRIL) 40 MG tablet Take 1 tablet (40 mg total) by mouth daily. 90 tablet 3   metformin (FORTAMET) 500 MG (OSM) 24 hr tablet Take 1,000 mg by mouth at bedtime.     nitroGLYCERIN (NITROSTAT) 0.4 MG SL tablet Place 1 tablet (0.4 mg total) under the tongue every 5 (five) minutes as needed for chest pain. 15 tablet 0   pregabalin (LYRICA) 50 MG capsule Take 1 capsule (50 mg total) by mouth 2 (two) times daily. (Patient taking differently: Take 50 mg by mouth 2 (two) times daily. prn) 60 capsule 2   rosuvastatin (CRESTOR) 40 MG tablet TAKE ONE TABLET BY MOUTH DAILY 90 tablet 3   spironolactone (ALDACTONE) 25 MG tablet Take 1 tablet (25 mg total) by mouth daily. 90 tablet 1   Tiotropium Bromide Monohydrate (SPIRIVA RESPIMAT) 2.5 MCG/ACT AERS Inhale 2 puffs into the lungs daily. 30 each 11   torsemide 40 MG TABS Take 40 mg by mouth every Monday, Wednesday, and Friday. In addition to your daily 40mg  tablet taken every day 30 tablet 0   TRULICITY 3 MG/0.5ML SOPN Inject 3 mg into the skin once a week. SATURDAYS     VENTOLIN HFA 108 (90 Base) MCG/ACT inhaler INHALE TWO PUFFS INTO THE LUNGS EVERY SIX HOURS AS NEEDED FOR WHEEZING OR SHORTNESS OF BREATH 18 each 2   No current facility-administered medications for this visit.   Facility-Administered Medications Ordered in Other Visits  Medication Dose Route Frequency Provider Last Rate Last Admin   albuterol (PROVENTIL) (2.5 MG/3ML) 0.083% nebulizer solution 2.5 mg  2.5 mg Nebulization Once Raechel Chute, MD        Vitals:   07/11/23 0947  BP: (!) 169/95  Pulse: 62  SpO2: 97%  Weight: 227 lb (103 kg)   Wt Readings from Last 3 Encounters:  07/11/23 227 lb (103 kg)  07/09/23 231 lb (104.8 kg)  06/21/23 222 lb 3.2 oz (100.8 kg)   Lab Results  Component Value Date   CREATININE 1.24 07/09/2023   CREATININE 1.34 (H)  06/21/2023   CREATININE 1.53 (H) 06/04/2023  PHYSICAL EXAM:  General:  Well appearing. No resp difficulty HEENT: negative Neck: supple. JVP flat. No lymphadenopathy or thryomegaly appreciated. Cor: PMI normal. Regular rate & rhythm. No rubs, gallops. 2/6 systolic murmur at LSB. Lungs: clear Abdomen: soft, nontender, nondistended. No hepatosplenomegaly. No bruits or masses.  Extremities: no cyanosis, clubbing, rash, edema Neuro: alert & oriented x 3, cranial nerves grossly intact. Moves all 4 extremities w/o difficulty. Affect pleasant. Skin: bruising noted on right forearm, + pulses No reproducible musculoskeletal pain  ECG: not done  ReDs: 42% (based on lack of symptoms, feel this is a false elevation)   ASSESSMENT & PLAN:  1: NICM with preserved ejection fraction with LVH- - suscpect due to HTN and OSA - NYHA class II - euvolemic - weighing daily; reminded to call for an overnight weight gain of >2 pounds or a weekly weight gain of > 5 pounds - weight down 4 pounds from last visit here 2 days ago - ReDs 42% although this doesn't correlate with his symptoms; 2 days ago it was 43%  - 2 days ago he received 80mg  IV lasix/ PO potassium  - echo 08/23/22: EF of 60-65% along with mild LVH & mild MR. - echo 05/28/23: EF 50-55% with moderate LVH, Grade III DD and mild MR - continue jardiance 10mg  QD - continue torsemide 40mg  daily with additional 40mg  on M, W, F; may need to increase this but will hold off for now; he does get pill packs and has already gotten his next month's pack  - continue lisinopril 40mg  daily  - continue carvedilol 6.25mg  BID - continue spironolactone 25mg  daily - drinking less fluids and is trying to keep it closer to 60-64 ounces - not adding salt but continues to eat higher sodium foods  - BNP 07/09/23 was 127.8  2: HTN- - BP 169/95 (hasn't taken meds yet because he hasn't eaten breakfast) - saw PCP Angelica Chessman) 09/24  - BMP 07/09/23 reviewed and showed  sodium 137, potassium 4.0, creatinine 1.24 and GFR >60  3: DM- - A1c 05/02/23 was 10.6% - currently on trulicity, metformin and insulin - saw endocrinology (Rhinehart) 07/24 - 03/21/23 microalbumin/ creat ratio was 1338  4: Reactive airway disease- - saw pulmonology (Dgayli) 06/24 - using spiriva  - continues to not smoke - wearing CPAP most nights   5: Hyperlipidemia- - continue ezetimibe 10mg  daily - continue rosuvastatin 40mg  daily - lipid panel 03/21/23 showed LDL 130, triglycerides 536  6: CAD- - saw cardiology (Dunn) 09/24 - continue ezetimibe 10mg  daily - continue rosuvastatin 40mg  daily - RHC/LHC 05/29/23: Moderate, nonobstructive coronary artery disease with sequential 50-60% ostial and proximal LAD stenoses that are not hemodynamically significant (RFR = 0.93).  There is also a 40% ostial stenosis of a large first OM branch.  Nondominant RCA is without significant disease. Upper normal to mildly elevated left and right heart filling pressures. Mild pulmonary hypertension. Borderline low Fick cardiac output/index. Mild aortic stenosis.  7: Chest pain- - resolved  He didn't bring his pill packs nor does he know what he's taking. Will call his pharmacy to fax a med list to [place]  Return in 3 weeks, sooner if needed.

## 2023-07-16 ENCOUNTER — Ambulatory Visit: Payer: Medicaid Other | Admitting: Cardiology

## 2023-07-26 ENCOUNTER — Encounter: Payer: Self-pay | Admitting: Physician Assistant

## 2023-07-26 ENCOUNTER — Ambulatory Visit: Payer: Medicaid Other | Admitting: Physician Assistant

## 2023-07-26 VITALS — BP 178/94 | HR 78 | Temp 98.0°F | Resp 16 | Ht 66.5 in | Wt 233.2 lb

## 2023-07-26 DIAGNOSIS — B027 Disseminated zoster: Secondary | ICD-10-CM

## 2023-07-26 MED ORDER — VALACYCLOVIR HCL 1 G PO TABS
1000.0000 mg | ORAL_TABLET | Freq: Three times a day (TID) | ORAL | 0 refills | Status: AC
Start: 2023-07-26 — End: 2023-08-02

## 2023-07-26 NOTE — Progress Notes (Signed)
Acute Office Visit   Patient: Cory Rivas   DOB: [birth year]   57 y.o. Male  MRN: [id number] Visit Date: 07/26/2023  Today's healthcare provider: Oswaldo Conroy Denee Boeder, PA-C  Introduced myself to the patient as a Secondary school teacher and provided education on APPs in clinical practice.    Chief Complaint  Patient presents with   Rash    L Arm rash, red and sores started on Saturday   Subjective    HPI HPI     Rash    Additional comments: L Arm rash, red and sores started on Saturday      Last edited by Forde Radon, CMA on 07/26/2023  2:22 PM.      RASH Duration:  days started on Sat on left arm/elbow then started to affect right arm  Right arm is notable for excoriations and redness  He reports it will start feeling like pins and needles then like it was stung by yellow jackets  He denies getting into poison oak or ivy recently. Denies known bug bites  Location: arms  Itching: yes Burning: yes Redness: yes Oozing: no Scaling: no Blisters: no Painful: yes Fevers: no Change in detergents/soaps/personal care products: no Recent illness: no Recent travel:no History of same: yes- reports the rash seems to come back at this time of the year  Context: worse Alleviating factors: nothing Treatments attempted:lotion/moisturizer Shortness of breath: yes - chronic due to COPD, unchanged  Throat/tongue swelling: no Myalgias/arthralgias: yes along elbow affected by rash   He is prescribed Lyrica 50 mg PO BID - he states he only takes when his neck is hurting badly    Medications: Outpatient Medications Prior to Visit  Medication Sig   allopurinol (ZYLOPRIM) 300 MG tablet TAKE ONE TABLET BY MOUTH DAILY WITH 100 MG TABLET. FOR A TOTAL OF 400 MG DAILY FOR GOUT   aspirin EC 81 MG tablet Take 1 tablet (81 mg total) by mouth daily. Swallow whole.   carvedilol (COREG) 12.5 MG tablet Take 1 tablet (12.5 mg total) by mouth 2 (two) times daily with a meal.   DULoxetine  (CYMBALTA) 30 MG capsule Take 60 mg (2 capsules) poqam and 30 mg (1 capsule) poqpm daily   empagliflozin (JARDIANCE) 10 MG TABS tablet Take 1 tablet (10 mg total) by mouth daily.   ezetimibe (ZETIA) 10 MG tablet TAKE ONE TABLET BY MOUTH DAILY   fluticasone (FLONASE) 50 MCG/ACT nasal spray Place 1 spray into both nostrils daily. (Patient taking differently: Place 1 spray into both nostrils daily as needed for rhinitis or allergies.)   insulin glargine (LANTUS SOLOSTAR) 100 UNIT/ML Solostar Pen Inject 20 Units into the skin at bedtime.   insulin lispro (HUMALOG) 100 UNIT/ML KwikPen Inject 1-45 Units into the skin 3 (three) times daily. Sliding scale up to 45 units per injection   ipratropium-albuterol (DUONEB) 0.5-2.5 (3) MG/3ML SOLN Take 3 mLs by nebulization every 6 (six) hours as needed (SOB wheeze cough).   levocetirizine (XYZAL) 5 MG tablet TAKE ONE TABLET BY MOUTH EVERY EVENING   lisinopril (ZESTRIL) 40 MG tablet Take 1 tablet (40 mg total) by mouth daily.   metformin (FORTAMET) 500 MG (OSM) 24 hr tablet Take 2,000 mg by mouth at bedtime.   pantoprazole (PROTONIX) 40 MG tablet Take 40 mg by mouth daily.   pregabalin (LYRICA) 50 MG capsule Take 1 capsule (50 mg total) by mouth 2 (two) times daily. (Patient taking differently: Take 50 mg by mouth 2 (  two) times daily. prn)   rosuvastatin (CRESTOR) 40 MG tablet TAKE ONE TABLET BY MOUTH DAILY   spironolactone (ALDACTONE) 25 MG tablet Take 1 tablet (25 mg total) by mouth daily.   Tiotropium Bromide Monohydrate (SPIRIVA RESPIMAT) 2.5 MCG/ACT AERS Inhale 2 puffs into the lungs daily.   Torsemide 40 MG TABS Take 40 mg by mouth in the morning.   TRULICITY 3 MG/0.5ML SOPN Inject 3 mg into the skin once a week. SATURDAYS   VENTOLIN HFA 108 (90 Base) MCG/ACT inhaler INHALE TWO PUFFS INTO THE LUNGS EVERY SIX HOURS AS NEEDED FOR WHEEZING OR SHORTNESS OF BREATH   nitroGLYCERIN (NITROSTAT) 0.4 MG SL tablet Place 1 tablet (0.4 mg total) under the tongue every 5  (five) minutes as needed for chest pain.   Facility-Administered Medications Prior to Visit  Medication Dose Route Frequency Provider   albuterol (PROVENTIL) (2.5 MG/3ML) 0.083% nebulizer solution 2.5 mg  2.5 mg Nebulization Once Raechel Chute, MD    Review of Systems  Skin:  Positive for color change and rash.        Objective    BP (!) 178/94   Pulse 78   Temp 98 F (36.7 C) (Oral)   Resp 16   Ht 5' 6.5" (1.689 m)   Wt 233 lb 3.2 oz (105.8 kg)   SpO2 96%   BMI 37.08 kg/m     Physical Exam Vitals reviewed.  Constitutional:      General: He is awake.     Appearance: Normal appearance. He is well-developed and well-groomed.  HENT:     Head: Normocephalic and atraumatic.  Pulmonary:     Effort: Pulmonary effort is normal.  Musculoskeletal:     Cervical back: Normal range of motion.  Skin:    General: Skin is warm and dry.     Findings: Rash present.       Neurological:     Mental Status: He is alert.  Psychiatric:        Behavior: Behavior is cooperative.       No results found for any visits on 07/26/23.  Assessment & Plan      Return in about 2 weeks (around 08/09/2023) for DM, HLD, HTN.      Problem List Items Addressed This Visit   None Visit Diagnoses     Disseminated herpes zoster    -  Primary Acute, new concern Patient reports painful, itchy rash along right elbow since Saturday HPI and physical exam today appears consistent with suspected herpes zoster Will try starting Valtrex 1000 mg p.o. 3 times daily for 7 days to assist with management Recommend that he takes Lyrica 50 mg p.o. twice daily for at least 2 weeks to assist with nerve pain Reviewed that he should cleanse the area with warm water and gentle soap, can use Benadryl cream or hydrocortisone as needed for itching Reviewed signs and symptoms of cellulitis that would require return to office or emergency room visit Follow-up as needed for progressing or persistent symptoms    Relevant Medications   valACYclovir (VALTREX) 1000 MG tablet        Return in about 2 weeks (around 08/09/2023) for DM, HLD, HTN.   I, Talana Slatten E Tressy Kunzman, PA-C, have reviewed all documentation for this visit. The documentation on 07/26/23 for the exam, diagnosis, procedures, and orders are all accurate and complete.   Jacquelin Hawking, MHS, PA-C [facility]

## 2023-07-26 NOTE — Patient Instructions (Addendum)
Please start taking the Lyrica twice per day for at least 2 weeks to help with the pain  I have sent in a script for a medication for shingles called Valtrex to help with your rash.  Please take this every 8 hours for 7 days with plenty of water.   You can clean the area with gentle soap and warm water. You can use Benadryl cream or Hydrocortisone cream to help with itching   If you notice increased redness, warmth, pain and fevers please call us sooner

## 2023-07-29 ENCOUNTER — Encounter: Payer: Self-pay | Admitting: Family Medicine

## 2023-07-31 NOTE — Progress Notes (Deleted)
PCP: Danelle Berry, PA-C (last seen 10/24) Primary Cardiologist: Debbe Odea, MD (last seen 09/24)  HPI:   Cory Rivas is a 57 y/o male with a history of DM, hyperlipidemia, tracheobronchomalacia, nonobstructive CAD, HTN, gout, COPD, asthma, CKD, dyslipidemia, tobacco use and chronic heart failure.   Admitted 08/23/22 due to dyspnea, productive cough of brown sputum, mild sore throat, fatigue, dizziness and chest soreness. Steroid burst given. Initially given IV lasix with transition to oral diuretics. Lisinopril held due to AKI. Discharged after 2 days. Admitted 11/22/22 due to acute on chronic HF. Weaned off of bipap.Was in the ED 03/01/23 due to neck pain after falling off his tractor after he hit a stump. He landed on his right side.Head CT/ cervical spine xrays were negative. Was in the ED 03/09/23 due to atypical chest pain. Chest CTA negative for PE. Was in the ED 03/22/23 due to nonspecific chest pain. Workup negative. Was in the ED 03/23/23 due to hyperglycemia with glucose of >400.    Admitted 05/27/23 due to acute onset of chest pain, shortness of breath and diaphoresis. Blood pressure was significantly elevated to 204/102 on admission. Troponins were mildly elevated up to 26.  Cardiology was consulted and he underwent cardiac catheterization 8/14 which revealed non-obstructive coronary artery disease. Meds adjusted. Chest x-ray without acute cardiopulmonary process. Echo on 05/28/2023 showed an EF of 50 to 55%, no regional wall motion abnormalities, moderate LVH, grade 3 diastolic dysfunction, elevated left atrial pressure, normal RV systolic function and ventricular cavity size, mildly dilated left atrium, mild mitral regurgitation, aortic valve sclerosis without evidence of stenosis, and an estimated right atrial pressure of 3 mmHg.   Echo 08/23/22: EF of 60-65% along with mild LVH & mild Cory.  Echo 05/28/23: EF 50-55% with moderate LVH, Grade III DD and mild Cory  RHC/LHC 05/29/23: Moderate,  nonobstructive coronary artery disease with sequential 50-60% ostial and proximal LAD stenoses that are not hemodynamically significant (RFR = 0.93).  There is also a 40% ostial stenosis of a large first OM branch.  Nondominant RCA is without significant disease. Upper normal to mildly elevated left and right heart filling pressures. Mild pulmonary hypertension. Borderline low Fick cardiac output/index. Mild aortic stenosis.  He presents today for a HF follow-up visit with a chief complaint of      ROS: All systems negative except as listed in HPI, PMH and Problem List.  SH:  Social History   Socioeconomic History   Marital status: Divorced    Spouse name: Not on file   Number of children: 0   Years of education: 9   Highest education level: 9th grade  Occupational History   Occupation: disability  Tobacco Use   Smoking status: Some Days    Current packs/day: 0.00    Average packs/day: 1 pack/day for 37.0 years (37.0 ttl pk-yrs)    Types: Cigarettes    Start date: 10/19/1989    Last attempt to quit: 08/20/2022    Years since quitting: 0.9   Smokeless tobacco: Never   Tobacco comments:    30+ years hx smoking 2 ppd, stopped smoking briefly in April 2022 for a surgery and then restarted smoking about 1/2 ppd last 4-5 months         5-6cigs daily- 04/10/2023  Vaping Use   Vaping status: Never Used  Substance and Sexual Activity   Alcohol use: No   Drug use: No   Sexual activity: Not Currently    Partners: Female  Other Topics Concern  Not on file  Social History Narrative   Lives with sister Cory Rivas, brother in law Cory Rivas), and his mother - Cory Rivas   Social Determinants of Health   Financial Resource Strain: Medium Risk (12/06/2022)   Overall Financial Resource Strain (CARDIA)    Difficulty of Paying Living Expenses: Somewhat hard  Food Insecurity: Food Insecurity Present (05/28/2023)   Hunger Vital Sign    Worried About Running Out of Food in the Last Year: Often true     Ran Out of Food in the Last Year: Often true  Transportation Needs: No Transportation Needs (05/28/2023)   PRAPARE - Administrator, Civil Service (Medical): No    Lack of Transportation (Non-Medical): No  Physical Activity: Insufficiently Active (12/06/2022)   Exercise Vital Sign    Days of Exercise per Week: 3 days    Minutes of Exercise per Session: 10 min  Stress: No Stress Concern Present (12/06/2022)   [employer] of Occupational Health - Occupational Stress Questionnaire    Feeling of Stress : Only a little  Social Connections: Socially Isolated (12/06/2022)   Social Connection and Isolation Panel [NHANES]    Frequency of Communication with Friends and Family: More than three times a week    Frequency of Social Gatherings with Friends and Family: Twice a week    Attends Religious Services: Never    Database administrator or Organizations: No    Attends Banker Meetings: Never    Marital Status: Divorced  Catering manager Violence: Not At Risk (05/28/2023)   Humiliation, Afraid, Rape, and Kick questionnaire    Fear of Current or Ex-Partner: No    Emotionally Abused: No    Physically Abused: No    Sexually Abused: No    FH:  Family History  Problem Relation Age of Onset   Heart disease Mother    Diabetes Mother    Hyperlipidemia Mother    Hypertension Mother    Heart attack Mother    Lung cancer Father    Hypertension Father    Stroke Father    AAA (abdominal aortic aneurysm) Maternal Grandmother    Heart attack Maternal Grandmother    Diabetes Maternal Grandfather     Past Medical History:  Diagnosis Date   Bulging of cervical intervertebral disc    CHF (congestive heart failure) (HCC)    Diabetes mellitus without complication (HCC)    Gout    High cholesterol    Hypertension    Stress due to family tension 04/15/2019    Current Outpatient Medications  Medication Sig Dispense Refill   allopurinol (ZYLOPRIM) 300 MG tablet TAKE  ONE TABLET BY MOUTH DAILY WITH 100 MG TABLET. FOR A TOTAL OF 400 MG DAILY FOR GOUT 90 tablet 1   carvedilol (COREG) 12.5 MG tablet Take 1 tablet (12.5 mg total) by mouth 2 (two) times daily with a meal. 180 tablet 3   DULoxetine (CYMBALTA) 30 MG capsule Take 60 mg (2 capsules) poqam and 30 mg (1 capsule) poqpm daily 270 capsule 1   empagliflozin (JARDIANCE) 10 MG TABS tablet Take 1 tablet (10 mg total) by mouth daily. 90 tablet 1   ezetimibe (ZETIA) 10 MG tablet TAKE ONE TABLET BY MOUTH DAILY 90 tablet 3   fluticasone (FLONASE) 50 MCG/ACT nasal spray Place 1 spray into both nostrils daily. (Patient taking differently: Place 1 spray into both nostrils daily as needed for rhinitis or allergies.) 18.2 mL 6   insulin glargine (LANTUS SOLOSTAR) 100 UNIT/ML  Solostar Pen Inject 20 Units into the skin at bedtime.     insulin lispro (HUMALOG) 100 UNIT/ML KwikPen Inject 1-45 Units into the skin 3 (three) times daily. Sliding scale up to 45 units per injection     ipratropium-albuterol (DUONEB) 0.5-2.5 (3) MG/3ML SOLN Take 3 mLs by nebulization every 6 (six) hours as needed (SOB wheeze cough). 180 mL 1   levocetirizine (XYZAL) 5 MG tablet TAKE ONE TABLET BY MOUTH EVERY EVENING 90 tablet 1   lisinopril (ZESTRIL) 40 MG tablet Take 1 tablet (40 mg total) by mouth daily. 90 tablet 3   metformin (FORTAMET) 500 MG (OSM) 24 hr tablet Take 2,000 mg by mouth at bedtime.     nitroGLYCERIN (NITROSTAT) 0.4 MG SL tablet Place 1 tablet (0.4 mg total) under the tongue every 5 (five) minutes as needed for chest pain. 15 tablet 0   pantoprazole (PROTONIX) 40 MG tablet Take 40 mg by mouth daily.     pregabalin (LYRICA) 50 MG capsule Take 1 capsule (50 mg total) by mouth 2 (two) times daily. (Patient taking differently: Take 50 mg by mouth 2 (two) times daily. prn) 60 capsule 2   rosuvastatin (CRESTOR) 40 MG tablet TAKE ONE TABLET BY MOUTH DAILY 90 tablet 3   spironolactone (ALDACTONE) 25 MG tablet Take 1 tablet (25 mg total) by  mouth daily. 90 tablet 1   Tiotropium Bromide Monohydrate (SPIRIVA RESPIMAT) 2.5 MCG/ACT AERS Inhale 2 puffs into the lungs daily. 30 each 11   Torsemide 40 MG TABS Take 40 mg by mouth in the morning. 90 tablet 3   TRULICITY 3 MG/0.5ML SOPN Inject 3 mg into the skin once a week. SATURDAYS     valACYclovir (VALTREX) 1000 MG tablet Take 1 tablet (1,000 mg total) by mouth 3 (three) times daily for 7 days. 21 tablet 0   VENTOLIN HFA 108 (90 Base) MCG/ACT inhaler INHALE TWO PUFFS INTO THE LUNGS EVERY SIX HOURS AS NEEDED FOR WHEEZING OR SHORTNESS OF BREATH 18 each 2   No current facility-administered medications for this visit.   Facility-Administered Medications Ordered in Other Visits  Medication Dose Route Frequency Provider Last Rate Last Admin   albuterol (PROVENTIL) (2.5 MG/3ML) 0.083% nebulizer solution 2.5 mg  2.5 mg Nebulization Once Raechel Chute, MD          PHYSICAL EXAM:  General:  Well appearing. No resp difficulty HEENT: negative Neck: supple. JVP flat. No lymphadenopathy or thryomegaly appreciated. Cor: PMI normal. Regular rate & rhythm. No rubs, gallops. 2/6 systolic murmur at LSB. Lungs: clear Abdomen: soft, nontender, nondistended. No hepatosplenomegaly. No bruits or masses.  Extremities: no cyanosis, clubbing, rash, edema Neuro: alert & oriented x 3, cranial nerves grossly intact. Moves all 4 extremities w/o difficulty. Affect pleasant. Skin: bruising noted on right forearm, + pulses No reproducible musculoskeletal pain  ECG: not done     ASSESSMENT & PLAN:  1: NICM with preserved ejection fraction with LVH- - suscpect due to HTN and OSA - NYHA class II - euvolemic - weighing daily; reminded to call for an overnight weight gain of >2 pounds or a weekly weight gain of > 5 pounds - weight 227 pounds from last visit here 3 weeks ago - echo 08/23/22: EF of 60-65% along with mild LVH & mild Cory. - echo 05/28/23: EF 50-55% with moderate LVH, Grade III DD and mild  Cory - continue jardiance 10mg  QD - continue torsemide 40mg  daily with additional 40mg  on M, W, F - continue lisinopril 40mg   daily  - continue carvedilol 6.25mg  BID - continue spironolactone 25mg  daily - drinking less fluids and is trying to keep it closer to 60-64 ounces - not adding salt but continues to eat higher sodium foods  - BNP 07/09/23 was 127.8  2: HTN- - BP  - saw PCP (Mecum) 10/24  - BMP 07/09/23 reviewed and showed sodium 137, potassium 4.0, creatinine 1.24 and GFR >60  3: DM- - A1c 05/02/23 was 10.6% - currently on trulicity, metformin and insulin - saw endocrinology (Rhinehart) 07/24 - 03/21/23 microalbumin/ creat ratio was 1338  4: Reactive airway disease- - saw pulmonology (Dgayli) 06/24 - using spiriva  - continues to not smoke - wearing CPAP most nights   5: Hyperlipidemia- - continue ezetimibe 10mg  daily - continue rosuvastatin 40mg  daily - lipid panel 03/21/23 showed LDL 130, triglycerides 536  6: CAD- - saw cardiology (Dunn) 09/24 - continue ezetimibe 10mg  daily - continue rosuvastatin 40mg  daily - RHC/LHC 05/29/23:   Moderate, nonobstructive coronary artery disease with sequential 50-60% ostial and proximal LAD stenoses that are not hemodynamically significant (RFR = 0.93).  There is also a 40% ostial stenosis of a large first OM branch.  Nondominant RCA is without   significant disease.   Upper normal to mildly elevated left and right heart filling pressures.   Mild pulmonary hypertension.   Borderline low Fick cardiac output/index.   Mild aortic stenosis.

## 2023-08-01 ENCOUNTER — Encounter: Payer: Medicaid Other | Admitting: Family

## 2023-08-07 ENCOUNTER — Encounter: Payer: Self-pay | Admitting: Family

## 2023-08-07 ENCOUNTER — Ambulatory Visit: Payer: Medicaid Other | Attending: Family | Admitting: Family

## 2023-08-07 VITALS — BP 158/85 | HR 74 | Wt 232.2 lb

## 2023-08-07 DIAGNOSIS — I10 Essential (primary) hypertension: Secondary | ICD-10-CM

## 2023-08-07 DIAGNOSIS — E782 Mixed hyperlipidemia: Secondary | ICD-10-CM

## 2023-08-07 DIAGNOSIS — I5032 Chronic diastolic (congestive) heart failure: Secondary | ICD-10-CM

## 2023-08-07 DIAGNOSIS — M542 Cervicalgia: Secondary | ICD-10-CM | POA: Diagnosis not present

## 2023-08-07 DIAGNOSIS — N189 Chronic kidney disease, unspecified: Secondary | ICD-10-CM | POA: Diagnosis not present

## 2023-08-07 DIAGNOSIS — I130 Hypertensive heart and chronic kidney disease with heart failure and stage 1 through stage 4 chronic kidney disease, or unspecified chronic kidney disease: Secondary | ICD-10-CM | POA: Diagnosis not present

## 2023-08-07 DIAGNOSIS — F17210 Nicotine dependence, cigarettes, uncomplicated: Secondary | ICD-10-CM | POA: Insufficient documentation

## 2023-08-07 DIAGNOSIS — M109 Gout, unspecified: Secondary | ICD-10-CM | POA: Diagnosis not present

## 2023-08-07 DIAGNOSIS — E119 Type 2 diabetes mellitus without complications: Secondary | ICD-10-CM

## 2023-08-07 DIAGNOSIS — J4489 Other specified chronic obstructive pulmonary disease: Secondary | ICD-10-CM | POA: Diagnosis not present

## 2023-08-07 DIAGNOSIS — I509 Heart failure, unspecified: Secondary | ICD-10-CM | POA: Diagnosis not present

## 2023-08-07 DIAGNOSIS — J45909 Unspecified asthma, uncomplicated: Secondary | ICD-10-CM | POA: Diagnosis not present

## 2023-08-07 DIAGNOSIS — Z7984 Long term (current) use of oral hypoglycemic drugs: Secondary | ICD-10-CM | POA: Insufficient documentation

## 2023-08-07 DIAGNOSIS — Z79899 Other long term (current) drug therapy: Secondary | ICD-10-CM | POA: Insufficient documentation

## 2023-08-07 DIAGNOSIS — Z794 Long term (current) use of insulin: Secondary | ICD-10-CM | POA: Diagnosis not present

## 2023-08-07 DIAGNOSIS — E1122 Type 2 diabetes mellitus with diabetic chronic kidney disease: Secondary | ICD-10-CM | POA: Insufficient documentation

## 2023-08-07 DIAGNOSIS — G4733 Obstructive sleep apnea (adult) (pediatric): Secondary | ICD-10-CM | POA: Diagnosis not present

## 2023-08-07 DIAGNOSIS — Z7985 Long-term (current) use of injectable non-insulin antidiabetic drugs: Secondary | ICD-10-CM | POA: Insufficient documentation

## 2023-08-07 DIAGNOSIS — E785 Hyperlipidemia, unspecified: Secondary | ICD-10-CM | POA: Insufficient documentation

## 2023-08-07 DIAGNOSIS — I428 Other cardiomyopathies: Secondary | ICD-10-CM | POA: Diagnosis not present

## 2023-08-07 DIAGNOSIS — I350 Nonrheumatic aortic (valve) stenosis: Secondary | ICD-10-CM | POA: Insufficient documentation

## 2023-08-07 DIAGNOSIS — I2510 Atherosclerotic heart disease of native coronary artery without angina pectoris: Secondary | ICD-10-CM | POA: Diagnosis not present

## 2023-08-07 DIAGNOSIS — I272 Pulmonary hypertension, unspecified: Secondary | ICD-10-CM | POA: Diagnosis not present

## 2023-08-07 DIAGNOSIS — M546 Pain in thoracic spine: Secondary | ICD-10-CM | POA: Insufficient documentation

## 2023-08-07 MED ORDER — CARVEDILOL 25 MG PO TABS: 25.0000 mg | ORAL_TABLET | Freq: Two times a day (BID) | ORAL | 2 refills | Status: DC

## 2023-08-07 NOTE — Progress Notes (Signed)
PCP: Cory Berry, PA-C (last seen 10/24) Primary Cardiologist: Cory Odea, MD (last seen 09/24)  HPI:   Cory Rivas is a 57 y/o male with a history of DM, hyperlipidemia, tracheobronchomalacia, nonobstructive CAD, HTN, gout, COPD, asthma, CKD, dyslipidemia, tobacco use and chronic heart failure.   Admitted 08/23/22 due to dyspnea, productive cough of brown sputum, mild sore throat, fatigue, dizziness and chest soreness. Steroid burst given. Initially given IV lasix with transition to oral diuretics. Lisinopril held due to AKI. Discharged after 2 days. Admitted 11/22/22 due to acute on chronic HF. Weaned off of bipap.Was in the ED 03/01/23 due to neck pain after falling off his tractor after he hit a stump. He landed on his right side.Head CT/ cervical spine xrays were negative. Was in the ED 03/09/23 due to atypical chest pain. Chest CTA negative for PE. Was in the ED 03/22/23 due to nonspecific chest pain. Workup negative. Was in the ED 03/23/23 due to hyperglycemia with glucose of >400.    Admitted 05/27/23 due to acute onset of chest pain, shortness of breath and diaphoresis. Blood pressure was significantly elevated to 204/102 on admission. Troponins were mildly elevated up to 26.  Cardiology was consulted and he underwent cardiac catheterization 8/14 which revealed non-obstructive coronary artery disease. Meds adjusted. Chest x-ray without acute cardiopulmonary process. Echo on 05/28/2023 showed an EF of 50 to 55%, no regional wall motion abnormalities, moderate LVH, grade 3 diastolic dysfunction, elevated left atrial pressure, normal RV systolic function and ventricular cavity size, mildly dilated left atrium, mild mitral regurgitation, aortic valve sclerosis without evidence of stenosis, and an estimated right atrial pressure of 3 mmHg.   Echo 08/23/22: EF of 60-65% along with mild LVH & mild Cory.  Echo 05/28/23: EF 50-55% with moderate LVH, Grade III DD and mild Cory  RHC/LHC 05/29/23: Moderate,  nonobstructive coronary artery disease with sequential 50-60% ostial and proximal LAD stenoses that are not hemodynamically significant (RFR = 0.93).  There is also a 40% ostial stenosis of a large first OM branch.  Nondominant RCA is without significant disease. Upper normal to mildly elevated left and right heart filling pressures. Mild pulmonary hypertension. Borderline low Fick cardiac output/index. Mild aortic stenosis.  He presents today for a HF follow-up visit with a chief complaint of minimal fatigue with moderate exertion. Has occasional palpitations with strenuous activity and chronic difficulty sleeping along with this. Says that he generally sleeps ~ 3 hours a night and that's all. Developed neck/ upper back pain last week after working on his tractor and lifting something. He says that he uses ice on it because heat makes it worse and that it's starting to feel better where he can turn his head side to side. Denies shortness of breath, chest pain, cough, abdominal distention, pedal edema, dizziness or weight gain.   Gets all of his medications pill packed. Says that his CPAP company came and took his CPAP back because he wasn't wearing it enough. He says that he does notice that his congestion has worsened since no longer wearing CPAP.   ROS: All systems negative except as listed in HPI, PMH and Problem List.  SH:  Social History   Socioeconomic History   Marital status: Divorced    Spouse name: Not on file   Number of children: 0   Years of education: 9   Highest education level: 9th grade  Occupational History   Occupation: disability  Tobacco Use   Smoking status: Some Days    Current  packs/day: 0.00    Average packs/day: 1 pack/day for 37.0 years (37.0 ttl pk-yrs)    Types: Cigarettes    Start date: 10/19/1989    Last attempt to quit: 08/20/2022    Years since quitting: 0.9   Smokeless tobacco: Never   Tobacco comments:    30+ years hx smoking 2 ppd, stopped smoking  briefly in April 2022 for a surgery and then restarted smoking about 1/2 ppd last 4-5 months         5-6cigs daily- 04/10/2023  Vaping Use   Vaping status: Never Used  Substance and Sexual Activity   Alcohol use: No   Drug use: No   Sexual activity: Not Currently    Partners: Female  Other Topics Concern   Not on file  Social History Narrative   Lives with sister Cory Rivas, brother in law Cory Rivas), and his mother - Cory Rivas   Social Determinants of Health   Financial Resource Strain: Medium Risk (12/06/2022)   Overall Financial Resource Strain (CARDIA)    Difficulty of Paying Living Expenses: Somewhat hard  Food Insecurity: Food Insecurity Present (05/28/2023)   Hunger Vital Sign    Worried About Running Out of Food in the Last Year: Often true    Ran Out of Food in the Last Year: Often true  Transportation Needs: No Transportation Needs (05/28/2023)   PRAPARE - Administrator, Civil Service (Medical): No    Lack of Transportation (Non-Medical): No  Physical Activity: Insufficiently Active (12/06/2022)   Exercise Vital Sign    Days of Exercise per Week: 3 days    Minutes of Exercise per Session: 10 min  Stress: No Stress Concern Present (12/06/2022)   [employer] of Occupational Health - Occupational Stress Questionnaire    Feeling of Stress : Only a little  Social Connections: Socially Isolated (12/06/2022)   Social Connection and Isolation Panel [NHANES]    Frequency of Communication with Friends and Family: More than three times a week    Frequency of Social Gatherings with Friends and Family: Twice a week    Attends Religious Services: Never    Database administrator or Organizations: No    Attends Banker Meetings: Never    Marital Status: Divorced  Catering manager Violence: Not At Risk (05/28/2023)   Humiliation, Afraid, Rape, and Kick questionnaire    Fear of Current or Ex-Partner: No    Emotionally Abused: No    Physically Abused: No     Sexually Abused: No    FH:  Family History  Problem Relation Age of Onset   Heart disease Mother    Diabetes Mother    Hyperlipidemia Mother    Hypertension Mother    Heart attack Mother    Lung cancer Father    Hypertension Father    Stroke Father    AAA (abdominal aortic aneurysm) Maternal Grandmother    Heart attack Maternal Grandmother    Diabetes Maternal Grandfather     Past Medical History:  Diagnosis Date   Bulging of cervical intervertebral disc    CHF (congestive heart failure) (HCC)    Diabetes mellitus without complication (HCC)    Gout    High cholesterol    Hypertension    Stress due to family tension 04/15/2019    Current Outpatient Medications  Medication Sig Dispense Refill   allopurinol (ZYLOPRIM) 300 MG tablet TAKE ONE TABLET BY MOUTH DAILY WITH 100 MG TABLET. FOR A TOTAL OF 400 MG DAILY FOR  GOUT 90 tablet 1   carvedilol (COREG) 12.5 MG tablet Take 1 tablet (12.5 mg total) by mouth 2 (two) times daily with a meal. 180 tablet 3   DULoxetine (CYMBALTA) 30 MG capsule Take 60 mg (2 capsules) poqam and 30 mg (1 capsule) poqpm daily 270 capsule 1   empagliflozin (JARDIANCE) 10 MG TABS tablet Take 1 tablet (10 mg total) by mouth daily. 90 tablet 1   ezetimibe (ZETIA) 10 MG tablet TAKE ONE TABLET BY MOUTH DAILY 90 tablet 3   fluticasone (FLONASE) 50 MCG/ACT nasal spray Place 1 spray into both nostrils daily. (Patient taking differently: Place 1 spray into both nostrils daily as needed for rhinitis or allergies.) 18.2 mL 6   insulin glargine (LANTUS SOLOSTAR) 100 UNIT/ML Solostar Pen Inject 20 Units into the skin at bedtime.     insulin lispro (HUMALOG) 100 UNIT/ML KwikPen Inject 1-45 Units into the skin 3 (three) times daily. Sliding scale up to 45 units per injection     ipratropium-albuterol (DUONEB) 0.5-2.5 (3) MG/3ML SOLN Take 3 mLs by nebulization every 6 (six) hours as needed (SOB wheeze cough). 180 mL 1   levocetirizine (XYZAL) 5 MG tablet TAKE ONE TABLET BY  MOUTH EVERY EVENING 90 tablet 1   lisinopril (ZESTRIL) 40 MG tablet Take 1 tablet (40 mg total) by mouth daily. 90 tablet 3   metformin (FORTAMET) 500 MG (OSM) 24 hr tablet Take 2,000 mg by mouth at bedtime.     nitroGLYCERIN (NITROSTAT) 0.4 MG SL tablet Place 1 tablet (0.4 mg total) under the tongue every 5 (five) minutes as needed for chest pain. 15 tablet 0   pantoprazole (PROTONIX) 40 MG tablet Take 40 mg by mouth daily.     pregabalin (LYRICA) 50 MG capsule Take 1 capsule (50 mg total) by mouth 2 (two) times daily. (Patient taking differently: Take 50 mg by mouth 2 (two) times daily. prn) 60 capsule 2   rosuvastatin (CRESTOR) 40 MG tablet TAKE ONE TABLET BY MOUTH DAILY 90 tablet 3   spironolactone (ALDACTONE) 25 MG tablet Take 1 tablet (25 mg total) by mouth daily. 90 tablet 1   Tiotropium Bromide Monohydrate (SPIRIVA RESPIMAT) 2.5 MCG/ACT AERS Inhale 2 puffs into the lungs daily. 30 each 11   Torsemide 40 MG TABS Take 40 mg by mouth in the morning. 90 tablet 3   TRULICITY 3 MG/0.5ML SOPN Inject 3 mg into the skin once a week. SATURDAYS     VENTOLIN HFA 108 (90 Base) MCG/ACT inhaler INHALE TWO PUFFS INTO THE LUNGS EVERY SIX HOURS AS NEEDED FOR WHEEZING OR SHORTNESS OF BREATH 18 each 2   No current facility-administered medications for this visit.   Facility-Administered Medications Ordered in Other Visits  Medication Dose Route Frequency Provider Last Rate Last Admin   albuterol (PROVENTIL) (2.5 MG/3ML) 0.083% nebulizer solution 2.5 mg  2.5 mg Nebulization Once Cory Chute, MD       Vitals:   08/07/23 1023  BP: (!) 158/85  Pulse: 74  SpO2: 97%  Weight: 232 lb 4 oz (105.3 kg)   Wt Readings from Last 3 Encounters:  08/07/23 232 lb 4 oz (105.3 kg)  07/26/23 233 lb 3.2 oz (105.8 kg)  07/11/23 227 lb (103 kg)   Lab Results  Component Value Date   CREATININE 1.24 07/09/2023   CREATININE 1.34 (H) 06/21/2023   CREATININE 1.53 (H) 06/04/2023   PHYSICAL EXAM:  General:  Well  appearing. No resp difficulty HEENT: negative Neck: supple. JVP flat. No lymphadenopathy  or thryomegaly appreciated. Cor: PMI normal. Regular rate & rhythm. No rubs, gallops. 2/6 systolic murmur at LSB. Lungs: clear Abdomen: soft, nontender, nondistended. No hepatosplenomegaly. No bruits or masses.  Extremities: no cyanosis, clubbing, rash, edema Neuro: alert & oriented x 3, cranial nerves grossly intact. Moves all 4 extremities w/o difficulty. Affect pleasant.   ECG: not done   ASSESSMENT & PLAN:  1: NICM with preserved ejection fraction with LVH- - suscpect due to HTN and OSA - NYHA class II - euvolemic - weighing daily; reminded to call for an overnight weight gain of >2 pounds or a weekly weight gain of > 5 pounds - weight up 5 pounds from last visit here 1 month ago - echo 08/23/22: EF of 60-65% along with mild LVH & mild Cory. - echo 05/28/23: EF 50-55% with moderate LVH, Grade III DD and mild Cory - continue jardiance 10mg  QD - continue torsemide 40mg  daily with additional 40mg  on M, W, F - continue lisinopril 40mg  daily  - increase carvedilol to 25mg  BID - continue spironolactone 25mg  daily - drinking less fluids and is trying to keep it closer to 60-64 ounces - not adding salt but continues to eat higher sodium foods  - BNP 07/09/23 was 127.8  2: HTN- - BP 158/85 - saw PCP (Cory Rivas) 10/24  - BMP 07/09/23 reviewed and showed sodium 137, potassium 4.0, creatinine 1.24 and GFR >60  3: DM- - A1c 05/02/23 was 10.6% - currently on trulicity, metformin and insulin - saw endocrinology (Cory Rivas) 07/24 - 03/21/23 microalbumin/ creat ratio was 1338  4: Reactive airway disease- - saw pulmonology (Cory Rivas) 06/24 - using spiriva  - continues to not smoke - CPAP was returned as he wasn't wearing it long enough each night  5: Hyperlipidemia- - continue ezetimibe 10mg  daily - continue rosuvastatin 40mg  daily - lipid panel 03/21/23 showed LDL 130, triglycerides 536  6: CAD- - saw  cardiology (Cory Rivas) 09/24 - continue ezetimibe 10mg  daily - continue rosuvastatin 40mg  daily - RHC/LHC 05/29/23:   Moderate, nonobstructive coronary artery disease with sequential 50-60% ostial and proximal LAD stenoses that are not hemodynamically significant (RFR = 0.93).  There is also a 40% ostial stenosis of a large first OM branch.  Nondominant RCA is without   significant disease.   Upper normal to mildly elevated left and right heart filling pressures.   Mild pulmonary hypertension.   Borderline low Fick cardiac output/index.   Mild aortic stenosis.  Return in 1 month, sooner if needed.

## 2023-08-07 NOTE — Patient Instructions (Addendum)
INCREASE CARVEDILOL (COREG) TO 25 MG TWICE DAILY

## 2023-08-09 ENCOUNTER — Encounter: Payer: Self-pay | Admitting: Physician Assistant

## 2023-08-09 ENCOUNTER — Ambulatory Visit: Payer: Medicaid Other | Admitting: Physician Assistant

## 2023-08-09 VITALS — BP 152/74 | HR 65 | Temp 98.0°F | Resp 16 | Ht 66.5 in | Wt 228.7 lb

## 2023-08-09 DIAGNOSIS — Z794 Long term (current) use of insulin: Secondary | ICD-10-CM | POA: Diagnosis not present

## 2023-08-09 DIAGNOSIS — I10 Essential (primary) hypertension: Secondary | ICD-10-CM

## 2023-08-09 DIAGNOSIS — Z1211 Encounter for screening for malignant neoplasm of colon: Secondary | ICD-10-CM | POA: Diagnosis not present

## 2023-08-09 DIAGNOSIS — N182 Chronic kidney disease, stage 2 (mild): Secondary | ICD-10-CM | POA: Diagnosis not present

## 2023-08-09 DIAGNOSIS — E785 Hyperlipidemia, unspecified: Secondary | ICD-10-CM

## 2023-08-09 DIAGNOSIS — J4489 Other specified chronic obstructive pulmonary disease: Secondary | ICD-10-CM | POA: Diagnosis not present

## 2023-08-09 DIAGNOSIS — E1169 Type 2 diabetes mellitus with other specified complication: Secondary | ICD-10-CM | POA: Diagnosis not present

## 2023-08-09 DIAGNOSIS — I5033 Acute on chronic diastolic (congestive) heart failure: Secondary | ICD-10-CM | POA: Diagnosis not present

## 2023-08-09 DIAGNOSIS — E1165 Type 2 diabetes mellitus with hyperglycemia: Secondary | ICD-10-CM

## 2023-08-09 NOTE — Assessment & Plan Note (Signed)
Chronic, ongoing He most recently saw cardiology on 08/07/2023 and medication adjustments were made at that time He is currently taking torsemide 40 mg p.o. daily along with additional 40 mg dosing on Monday Wednesday Fridays.  He is also taking his lisinopril 40 mg p.o. daily, carvedilol 25 mg twice daily, spironolactone 25 mg p.o. daily He appears euvolemic today Continue current regimen Recommend he keeps upcoming follow up with cardiology  Follow up with PCP in 3 months or sooner if concerns arise

## 2023-08-09 NOTE — Progress Notes (Signed)
Established Patient Office Visit  Name: Cory Rivas   MRN: [id number]    DOB: 04/08/1966   Date:08/09/2023  Today's Provider: Jacquelin Hawking, MHS, PA-C Introduced myself to the patient as a PA-C and provided education on APPs in clinical practice.         Subjective  Chief Complaint  Chief Complaint  Patient presents with   Diabetes   Hyperlipidemia   Hypertension    Pt has not picked up recent med from cardiology at pharmacy    HPI  Most recently saw Cardiology on 08/07/23   "echo 05/28/23: EF 50-55% with moderate LVH, Grade III DD and mild MR - continue jardiance 10mg  QD - continue torsemide 40mg  daily with additional 40mg  on M, W, F - continue lisinopril 40mg  daily  - increase carvedilol to 25mg  BID - continue spironolactone 25mg  daily" He has follow up 08/21/2023   HYPERTENSION / HYPERLIPIDEMIA/ CHF/  Satisfied with current treatment? yes Duration of hypertension: years BP monitoring frequency: rarely- he has wrist monitor now  BP range:  BP medication side effects: no Past BP meds: carvedilol, lisinopril, and spironalactone Carvedilol was recently adjusted by Cardiology  Duration of hyperlipidemia: years Cholesterol medication side effects: no Cholesterol supplements: none Past cholesterol medications: rosuvastatin (crestor) and ezetimide (zetia) Medication compliance: good compliance Aspirin: yes Recent stressors: no Recurrent headaches: no Visual changes: no Palpitations: no Dyspnea: yes Chest pain: no Lower extremity edema: no Dizzy/lightheaded: no  Diabetes, Type 2 He has Endo with [facility] - last seen by them in July  - Last A1c 10.2% in June 2024 - Medications: Jardiance 10 mg PO every day, Trulicity 3mg  weekly injection, Lantus 20 units at bedtime, Lispro sliding scale- usually 8 units, Metformin 2000 mg pO at bedtime,  - Compliance: good  - Checking BG at home: he is checking with meals and at nighttime- runs 115-140s  - Diet: He is trying  to be mindful of excess sugar intake and choosing sugar free options for desserts  - Exercise: He is walking everyday - reports he has an active job  - Eye exam: UTD - Foot exam: UTD  - Microalbumin: UTD  - Statin: On statin and Zetia  - PNA vaccine: Discussed importance of vaccine for disease prevention, patient questions answered and education materials  offered.  - Denies symptoms of hypoglycemia, polyuria, polydipsia, numbness extremities, foot ulcers/trauma   He is using his Spiriva daily in the AM He is using rescue sparingly - has some congestion at nighttime that sometimes needs to be addressed with rescue  He reports limitations to activity due to SOB   Neck pain Reports this is a big issue for him He has shooting pain into both arms to level of elbow He has apt with Dr. Teola Bradley at the first of the year  He reports difficulty sleeping- states Pulm took his CPAP away because he wasn't sleeping -reports he can't sleep for longer than 2-3 hours due to neck stiffness   Patient Active Problem List   Diagnosis Date Noted   Acute on chronic diastolic heart failure (HCC) 05/30/2023   Unstable angina (HCC) 05/29/2023   Hypertensive urgency 05/27/2023   Chest pain 05/27/2023   Type 2 diabetes mellitus with peripheral neuropathy (HCC) 05/27/2023   GERD without esophagitis 05/27/2023   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   Dyslipidemia 05/27/2023   Acute decompensated heart failure (HCC) 11/23/2022   Hypertensive emergency 11/22/2022   Acute respiratory failure with  hypoxia (HCC) 11/22/2022   Elevated troponin 11/22/2022   Acute on chronic heart failure with preserved ejection fraction (HFpEF) (HCC) 08/24/2022   Acute on chronic diastolic congestive heart failure (HCC) 08/23/2022   Class 2 obesity 08/23/2022   Type 2 diabetes mellitus with hyperglycemia (HCC) 08/23/2022   Grade I diastolic dysfunction 06/08/2022   LVH (left ventricular hypertrophy) 06/08/2022   Accelerated  hypertension 06/08/2022   S/P cervical spinal fusion 08/18/2021   Former heavy tobacco smoker 08/18/2021   Gout 08/18/2021   Other chronic pain 10/26/2020   Systolic murmur 10/26/2020   Tobacco use 10/26/2020   Polyp of ascending colon    Rectal polyp    Class 2 severe obesity with serious comorbidity and body mass index (BMI) of 39.0 to 39.9 in adult [place]) 04/15/2019   Chronic kidney disease (CKD) stage G2/A3, mildly decreased glomerular filtration rate (GFR) between 60-89 mL/min/1.73 square meter and albuminuria creatinine ratio greater than 300 mg/g 04/15/2019   Proteinuria 04/15/2019   Hyperlipidemia associated with type 2 diabetes mellitus (HCC) 03/18/2019   Diabetes mellitus (HCC) 03/18/2019   Idiopathic chronic gout of multiple sites without tophus 03/18/2019   Essential hypertension 03/18/2019   Dermatitis 03/18/2019   Lumbar degenerative disc disease 11/22/2015   Cervical myelopathy (HCC) 11/22/2015   Osteoarthritis of spine with radiculopathy, lumbar region 11/22/2015    Past Surgical History:  Procedure Laterality Date   CHOLECYSTECTOMY     COLONOSCOPY WITH PROPOFOL N/A 03/10/2020   Procedure: COLONOSCOPY WITH PROPOFOL;  Surgeon: Midge Minium, MD;  Location: [facility] ENDOSCOPY;  Service: Endoscopy;  Laterality: N/A;   CORONARY PRESSURE/FFR STUDY N/A 05/29/2023   Procedure: CORONARY PRESSURE/FFR STUDY;  Surgeon: Yvonne Kendall, MD;  Location: [facility] INVASIVE CV LAB;  Service: Cardiovascular;  Laterality: N/A;   RIGHT/LEFT HEART CATH AND CORONARY ANGIOGRAPHY N/A 05/29/2023   Procedure: RIGHT/LEFT HEART CATH AND CORONARY ANGIOGRAPHY;  Surgeon: Yvonne Kendall, MD;  Location: [facility] INVASIVE CV LAB;  Service: Cardiovascular;  Laterality: N/A;    Family History  Problem Relation Age of Onset   Heart disease Mother    Diabetes Mother    Hyperlipidemia Mother    Hypertension Mother    Heart attack Mother    Lung cancer Father    Hypertension Father    Stroke Father    AAA  (abdominal aortic aneurysm) Maternal Grandmother    Heart attack Maternal Grandmother    Diabetes Maternal Grandfather     Social History   Tobacco Use   Smoking status: Some Days    Current packs/day: 0.00    Average packs/day: 1 pack/day for 37.0 years (37.0 ttl pk-yrs)    Types: Cigarettes    Start date: 10/19/1989    Last attempt to quit: 08/20/2022    Years since quitting: 0.9   Smokeless tobacco: Never   Tobacco comments:    30+ years hx smoking 2 ppd, stopped smoking briefly in April 2022 for a surgery and then restarted smoking about 1/2 ppd last 4-5 months         5-6cigs daily- 04/10/2023  Substance Use Topics   Alcohol use: No     Current Outpatient Medications:    allopurinol (ZYLOPRIM) 300 MG tablet, TAKE ONE TABLET BY MOUTH DAILY WITH 100 MG TABLET. FOR A TOTAL OF 400 MG DAILY FOR GOUT, Disp: 90 tablet, Rfl: 1   carvedilol (COREG) 25 MG tablet, Take 1 tablet (25 mg total) by mouth 2 (two) times daily., Disp: 180 tablet, Rfl: 2   DULoxetine (CYMBALTA) 30  MG capsule, Take 60 mg (2 capsules) poqam and 30 mg (1 capsule) poqpm daily, Disp: 270 capsule, Rfl: 1   empagliflozin (JARDIANCE) 10 MG TABS tablet, Take 1 tablet (10 mg total) by mouth daily., Disp: 90 tablet, Rfl: 1   ezetimibe (ZETIA) 10 MG tablet, TAKE ONE TABLET BY MOUTH DAILY, Disp: 90 tablet, Rfl: 3   fluticasone (FLONASE) 50 MCG/ACT nasal spray, Place 1 spray into both nostrils daily. (Patient taking differently: Place 1 spray into both nostrils daily as needed for rhinitis or allergies.), Disp: 18.2 mL, Rfl: 6   insulin glargine (LANTUS SOLOSTAR) 100 UNIT/ML Solostar Pen, Inject 20 Units into the skin at bedtime., Disp: , Rfl:    insulin lispro (HUMALOG) 100 UNIT/ML KwikPen, Inject 1-45 Units into the skin 3 (three) times daily. Sliding scale up to 45 units per injection, Disp: , Rfl:    ipratropium-albuterol (DUONEB) 0.5-2.5 (3) MG/3ML SOLN, Take 3 mLs by nebulization every 6 (six) hours as needed (SOB wheeze  cough)., Disp: 180 mL, Rfl: 1   levocetirizine (XYZAL) 5 MG tablet, TAKE ONE TABLET BY MOUTH EVERY EVENING, Disp: 90 tablet, Rfl: 1   lisinopril (ZESTRIL) 40 MG tablet, Take 1 tablet (40 mg total) by mouth daily., Disp: 90 tablet, Rfl: 3   metformin (FORTAMET) 500 MG (OSM) 24 hr tablet, Take 2,000 mg by mouth at bedtime., Disp: , Rfl:    pantoprazole (PROTONIX) 40 MG tablet, Take 40 mg by mouth daily., Disp: , Rfl:    pregabalin (LYRICA) 50 MG capsule, Take 1 capsule (50 mg total) by mouth 2 (two) times daily., Disp: 60 capsule, Rfl: 2   rosuvastatin (CRESTOR) 40 MG tablet, TAKE ONE TABLET BY MOUTH DAILY, Disp: 90 tablet, Rfl: 3   spironolactone (ALDACTONE) 25 MG tablet, Take 1 tablet (25 mg total) by mouth daily., Disp: 90 tablet, Rfl: 1   Tiotropium Bromide Monohydrate (SPIRIVA RESPIMAT) 2.5 MCG/ACT AERS, Inhale 2 puffs into the lungs daily., Disp: 30 each, Rfl: 11   Torsemide 40 MG TABS, Take 40 mg by mouth in the morning., Disp: 90 tablet, Rfl: 3   TRULICITY 3 MG/0.5ML SOPN, Inject 3 mg into the skin once a week. SATURDAYS, Disp: , Rfl:    VENTOLIN HFA 108 (90 Base) MCG/ACT inhaler, INHALE TWO PUFFS INTO THE LUNGS EVERY SIX HOURS AS NEEDED FOR WHEEZING OR SHORTNESS OF BREATH, Disp: 18 each, Rfl: 2   nitroGLYCERIN (NITROSTAT) 0.4 MG SL tablet, Place 1 tablet (0.4 mg total) under the tongue every 5 (five) minutes as needed for chest pain., Disp: 15 tablet, Rfl: 0 No current facility-administered medications for this visit.  Facility-Administered Medications Ordered in Other Visits:    albuterol (PROVENTIL) (2.5 MG/3ML) 0.083% nebulizer solution 2.5 mg, 2.5 mg, Nebulization, Once, Dgayli, Khabib, MD  Allergies  Allergen Reactions   Oxycodone Nausea And Vomiting   Entresto [Sacubitril-Valsartan] Nausea Only and Other (See Comments)    Lightheaded and dizzy    Onion Nausea And Vomiting   Nitroglycerin Itching and Rash    I personally reviewed active problem list, medication list, allergies,  health maintenance, notes from last encounter, lab results with the patient/caregiver today.   Review of Systems  Eyes:  Negative for blurred vision and double vision.  Respiratory:  Positive for cough and shortness of breath.   Cardiovascular:  Negative for chest pain, palpitations and leg swelling.  Musculoskeletal:  Positive for neck pain. Negative for falls.  Neurological:  Negative for dizziness, tingling, tremors and headaches.  Objective  Vitals:   08/09/23 1055  BP: (!) 152/74  Pulse: 65  Resp: 16  Temp: 98 F (36.7 C)  TempSrc: Oral  SpO2: 95%  Weight: 228 lb 11.2 oz (103.7 kg)  Height: 5' 6.5" (1.689 m)    Body mass index is 36.36 kg/m.  Physical Exam Vitals reviewed.  Constitutional:      General: He is awake.     Appearance: Normal appearance. He is well-developed and well-groomed.  HENT:     Head: Normocephalic and atraumatic.  Cardiovascular:     Rate and Rhythm: Normal rate and regular rhythm.     Pulses: Normal pulses.          Radial pulses are 2+ on the right side and 2+ on the left side.     Heart sounds: Normal heart sounds. No murmur heard.    No friction rub. No gallop.  Pulmonary:     Effort: Pulmonary effort is normal.     Breath sounds: Normal breath sounds. No decreased air movement. No decreased breath sounds, wheezing, rhonchi or rales.  Musculoskeletal:     Cervical back: Normal range of motion.     Right lower leg: No edema.     Left lower leg: No edema.  Neurological:     General: No focal deficit present.     Mental Status: He is alert and oriented to person, place, and time. Mental status is at baseline.     GCS: GCS eye subscore is 4. GCS verbal subscore is 5. GCS motor subscore is 6.  Psychiatric:        Attention and Perception: Attention and perception normal.        Mood and Affect: Mood and affect normal.        Speech: Speech normal.        Behavior: Behavior normal. Behavior is cooperative.        Thought  Content: Thought content normal.        Cognition and Memory: Cognition normal.      Recent Results (from the past 2160 hour(s))  CBG monitoring, ED     Status: Abnormal   Collection Time: 05/27/23  7:49 PM  Result Value Ref Range   Glucose-Capillary 195 (H) 70 - 99 mg/dL    Comment: Glucose reference range applies only to samples taken after fasting for at least 8 hours.  CBC     Status: None   Collection Time: 05/27/23  7:54 PM  Result Value Ref Range   WBC 8.8 4.0 - 10.5 K/uL   RBC 5.50 4.22 - 5.81 MIL/uL   Hemoglobin 15.8 13.0 - 17.0 g/dL   HCT 40.9 81.1 - 91.4 %   MCV 83.3 80.0 - 100.0 fL   MCH 28.7 26.0 - 34.0 pg   MCHC 34.5 30.0 - 36.0 g/dL   RDW 78.2 95.6 - 21.3 %   Platelets 225 150 - 400 K/uL   nRBC 0.0 0.0 - 0.2 %    Comment: Performed at [facility], [address] [place], [place] [address]  Troponin I (High Sensitivity)     Status: Abnormal   Collection Time: 05/27/23  7:54 PM  Result Value Ref Range   Troponin I (High Sensitivity) 26 (H) <18 ng/L    Comment: (NOTE) Elevated high sensitivity troponin I (hsTnI) values and significant  changes across serial measurements may suggest ACS but many other  chronic and acute conditions are known to elevate hsTnI results.  Refer to the "  Links" section for chest pain algorithms and additional  guidance. Performed at [facility], [address] [place], [place] [address]   Comprehensive metabolic panel     Status: Abnormal   Collection Time: 05/27/23  7:54 PM  Result Value Ref Range   Sodium 138 135 - 145 mmol/L   Potassium 3.7 3.5 - 5.1 mmol/L   Chloride 101 98 - 111 mmol/L   CO2 26 22 - 32 mmol/L   Glucose, Bld 197 (H) 70 - 99 mg/dL    Comment: Glucose reference range applies only to samples taken after fasting for at least 8 hours.   BUN 18 6 - 20 mg/dL   Creatinine, Ser 4.09 (H) 0.61 - 1.24 mg/dL   Calcium 9.0 8.9 - 81.1 mg/dL   Total Protein 7.5 6.5 - 8.1 g/dL   Albumin 3.8 3.5 - 5.0  g/dL   AST 15 15 - 41 U/L   ALT 17 0 - 44 U/L   Alkaline Phosphatase 69 38 - 126 U/L   Total Bilirubin 0.8 0.3 - 1.2 mg/dL   GFR, Estimated 50 (L) >60 mL/min    Comment: (NOTE) Calculated using the CKD-EPI Creatinine Equation (2021)    Anion gap 11 5 - 15    Comment: Performed at [facility], [address] [place], [place] [address]  Brain natriuretic peptide     Status: Abnormal   Collection Time: 05/27/23  7:54 PM  Result Value Ref Range   B Natriuretic Peptide 199.1 (H) 0.0 - 100.0 pg/mL    Comment: Performed at [facility], [address], [place], [place] [address]  Troponin I (High Sensitivity)     Status: Abnormal   Collection Time: 05/27/23  9:26 PM  Result Value Ref Range   Troponin I (High Sensitivity) 24 (H) <18 ng/L    Comment: (NOTE) Elevated high sensitivity troponin I (hsTnI) values and significant  changes across serial measurements may suggest ACS but many other  chronic and acute conditions are known to elevate hsTnI results.  Refer to the "Links" section for chest pain algorithms and additional  guidance. Performed at [facility], [address] [place], [place] [address]   Basic metabolic panel     Status: Abnormal   Collection Time: 05/28/23  3:00 AM  Result Value Ref Range   Sodium 138 135 - 145 mmol/L   Potassium 3.6 3.5 - 5.1 mmol/L   Chloride 103 98 - 111 mmol/L   CO2 25 22 - 32 mmol/L   Glucose, Bld 185 (H) 70 - 99 mg/dL    Comment: Glucose reference range applies only to samples taken after fasting for at least 8 hours.   BUN 23 (H) 6 - 20 mg/dL   Creatinine, Ser 5.78 (H) 0.61 - 1.24 mg/dL   Calcium 8.6 (L) 8.9 - 10.3 mg/dL   GFR, Estimated 57 (L) >60 mL/min    Comment: (NOTE) Calculated using the CKD-EPI Creatinine Equation (2021)    Anion gap 10 5 - 15    Comment: Performed at [facility], [address] [place], [place] [address]  CBC     Status: None   Collection Time: 05/28/23  3:00 AM   Result Value Ref Range   WBC 9.6 4.0 - 10.5 K/uL   RBC 5.25 4.22 - 5.81 MIL/uL   Hemoglobin 15.3 13.0 - 17.0 g/dL   HCT 95.2 84.1 - 32.4 %   MCV 83.6 80.0 - 100.0 fL   MCH 29.1  26.0 - 34.0 pg   MCHC 34.9 30.0 - 36.0 g/dL   RDW 40.9 81.1 - 91.4 %   Platelets 212 150 - 400 K/uL   nRBC 0.0 0.0 - 0.2 %    Comment: Performed at [facility], [address] [place], [place] [address]  ECHOCARDIOGRAM COMPLETE     Status: None   Collection Time: 05/28/23  1:56 PM  Result Value Ref Range   Weight 3,672 oz   Height 66 in   BP 179/96 mmHg   Ao pk vel 1.91 m/s   AV Area VTI 2.70 cm2   AR max vel 2.74 cm2   AV Mean grad 7.3 mmHg   AV Peak grad 14.5 mmHg   Single Plane A2C EF 60.1 %   Single Plane A4C EF 55.9 %   Calc EF 58.2 %   S' Lateral 4.15 cm   AV Area mean vel 2.72 cm2   Area-P 1/2 3.17 cm2   MV VTI 2.47 cm2   Est EF 50 - 55%   Glucose, capillary     Status: Abnormal   Collection Time: 05/28/23  6:36 PM  Result Value Ref Range   Glucose-Capillary 252 (H) 70 - 99 mg/dL    Comment: Glucose reference range applies only to samples taken after fasting for at least 8 hours.  Troponin I (High Sensitivity)     Status: None   Collection Time: 05/28/23  8:26 PM  Result Value Ref Range   Troponin I (High Sensitivity) 17 <18 ng/L    Comment: (NOTE) Elevated high sensitivity troponin I (hsTnI) values and significant  changes across serial measurements may suggest ACS but many other  chronic and acute conditions are known to elevate hsTnI results.  Refer to the "Links" section for chest pain algorithms and additional  guidance. Performed at [facility], [address] [place], [place] [address]   Glucose, capillary     Status: Abnormal   Collection Time: 05/28/23  8:33 PM  Result Value Ref Range   Glucose-Capillary 242 (H) 70 - 99 mg/dL    Comment: Glucose reference range applies only to samples taken after fasting for at least 8 hours.  Troponin I (High  Sensitivity)     Status: None   Collection Time: 05/28/23 10:15 PM  Result Value Ref Range   Troponin I (High Sensitivity) 17 <18 ng/L    Comment: (NOTE) Elevated high sensitivity troponin I (hsTnI) values and significant  changes across serial measurements may suggest ACS but many other  chronic and acute conditions are known to elevate hsTnI results.  Refer to the "Links" section for chest pain algorithms and additional  guidance. Performed at [facility], [address] [place], [place] [address]   Basic metabolic panel     Status: Abnormal   Collection Time: 05/29/23  5:38 AM  Result Value Ref Range   Sodium 139 135 - 145 mmol/L   Potassium 4.1 3.5 - 5.1 mmol/L   Chloride 106 98 - 111 mmol/L   CO2 25 22 - 32 mmol/L   Glucose, Bld 169 (H) 70 - 99 mg/dL    Comment: Glucose reference range applies only to samples taken after fasting for at least 8 hours.   BUN 22 (H) 6 - 20 mg/dL   Creatinine, Ser 4.69 (H) 0.61 - 1.24 mg/dL   Calcium 8.7 (L) 8.9 - 10.3 mg/dL   GFR, Estimated 56 (L) >60 mL/min    Comment: (NOTE) Calculated using the  CKD-EPI Creatinine Equation (2021)    Anion gap 8 5 - 15    Comment: Performed at [facility], [address] [place], [place] [address]  Protime-INR     Status: None   Collection Time: 05/29/23  5:38 AM  Result Value Ref Range   Prothrombin Time 14.8 11.4 - 15.2 seconds   INR 1.1 0.8 - 1.2    Comment: (NOTE) INR goal varies based on device and disease states. Performed at [facility], [address] [place], [place] [address]   CBC     Status: None   Collection Time: 05/29/23  5:38 AM  Result Value Ref Range   WBC 10.0 4.0 - 10.5 K/uL   RBC 5.56 4.22 - 5.81 MIL/uL   Hemoglobin 16.0 13.0 - 17.0 g/dL   HCT 65.7 84.6 - 96.2 %   MCV 85.6 80.0 - 100.0 fL   MCH 28.8 26.0 - 34.0 pg   MCHC 33.6 30.0 - 36.0 g/dL   RDW 95.2 84.1 - 32.4 %   Platelets 221 150 - 400 K/uL   nRBC 0.0 0.0 - 0.2 %    Comment:  Performed at [facility], [address] [place], [place] [address]  Glucose, capillary     Status: Abnormal   Collection Time: 05/29/23  9:25 AM  Result Value Ref Range   Glucose-Capillary 136 (H) 70 - 99 mg/dL    Comment: Glucose reference range applies only to samples taken after fasting for at least 8 hours.  Glucose, capillary     Status: Abnormal   Collection Time: 05/29/23 11:40 AM  Result Value Ref Range   Glucose-Capillary 110 (H) 70 - 99 mg/dL    Comment: Glucose reference range applies only to samples taken after fasting for at least 8 hours.  Glucose, capillary     Status: Abnormal   Collection Time: 05/29/23  1:57 PM  Result Value Ref Range   Glucose-Capillary 110 (H) 70 - 99 mg/dL    Comment: Glucose reference range applies only to samples taken after fasting for at least 8 hours.  POCT I-Stat EG7     Status: None   Collection Time: 05/29/23  3:12 PM  Result Value Ref Range   pH, Ven 7.340 7.25 - 7.43   pCO2, Ven 49.0 44 - 60 mmHg   pO2, Ven 36 32 - 45 mmHg   Bicarbonate 26.4 20.0 - 28.0 mmol/L   TCO2 28 22 - 32 mmol/L   O2 Saturation 64 %   Acid-Base Excess 0.0 0.0 - 2.0 mmol/L   Sodium 141 135 - 145 mmol/L   Potassium 3.9 3.5 - 5.1 mmol/L   Calcium, Ion 1.15 1.15 - 1.40 mmol/L   HCT 44.0 39.0 - 52.0 %   Hemoglobin 15.0 13.0 - 17.0 g/dL   Sample type MIXED VENOUS SAMPLE    Comment NOTIFIED PHYSICIAN   I-STAT 7, (LYTES, BLD GAS, ICA, H+H)     Status: Abnormal   Collection Time: 05/29/23  3:13 PM  Result Value Ref Range   pH, Arterial 7.351 7.35 - 7.45   pCO2 arterial 45.4 32 - 48 mmHg   pO2, Arterial 63 (L) 83 - 108 mmHg   Bicarbonate 25.1 20.0 - 28.0 mmol/L   TCO2 26 22 - 32 mmol/L   O2 Saturation 91 %   Acid-base deficit 1.0 0.0 - 2.0 mmol/L   Sodium 141 135 - 145 mmol/L   Potassium 4.0 3.5 - 5.1 mmol/L   Calcium, Ion  1.16 1.15 - 1.40 mmol/L   HCT 45.0 39.0 - 52.0 %   Hemoglobin 15.3 13.0 - 17.0 g/dL   Sample type ARTERIAL   POCT Activated  clotting time     Status: None   Collection Time: 05/29/23  3:34 PM  Result Value Ref Range   Activated Clotting Time 287 seconds    Comment: Reference range 74-137 seconds for patients not on anticoagulant therapy.  Glucose, capillary     Status: Abnormal   Collection Time: 05/29/23  6:05 PM  Result Value Ref Range   Glucose-Capillary 154 (H) 70 - 99 mg/dL    Comment: Glucose reference range applies only to samples taken after fasting for at least 8 hours.  Glucose, capillary     Status: Abnormal   Collection Time: 05/29/23  8:40 PM  Result Value Ref Range   Glucose-Capillary 194 (H) 70 - 99 mg/dL    Comment: Glucose reference range applies only to samples taken after fasting for at least 8 hours.  Basic metabolic panel     Status: Abnormal   Collection Time: 05/30/23  5:51 AM  Result Value Ref Range   Sodium 139 135 - 145 mmol/L   Potassium 4.3 3.5 - 5.1 mmol/L   Chloride 106 98 - 111 mmol/L   CO2 24 22 - 32 mmol/L   Glucose, Bld 155 (H) 70 - 99 mg/dL    Comment: Glucose reference range applies only to samples taken after fasting for at least 8 hours.   BUN 21 (H) 6 - 20 mg/dL   Creatinine, Ser 4.09 (H) 0.61 - 1.24 mg/dL   Calcium 8.3 (L) 8.9 - 10.3 mg/dL   GFR, Estimated 56 (L) >60 mL/min    Comment: (NOTE) Calculated using the CKD-EPI Creatinine Equation (2021)    Anion gap 9 5 - 15    Comment: Performed at [facility], [address] [place], [place] [address]  Lipoprotein A (LPA)     Status: None   Collection Time: 05/30/23  5:51 AM  Result Value Ref Range   Lipoprotein (a) <8.4 <75.0 nmol/L    Comment: (NOTE) **Results verified by repeat testing** Note:  Values greater than or equal to 75.0 nmol/L may       indicate an independent risk factor for CHD,       but must be evaluated with caution when applied       to non-Caucasian populations due to the       influence of genetic factors on Lp(a) across       ethnicities. Performed At: [facility] [address] [place], [place] [id number] Jolene Schimke MD [id number]   Glucose, capillary     Status: Abnormal   Collection Time: 05/30/23  7:37 AM  Result Value Ref Range   Glucose-Capillary 153 (H) 70 - 99 mg/dL    Comment: Glucose reference range applies only to samples taken after fasting for at least 8 hours.  Glucose, capillary     Status: Abnormal   Collection Time: 05/30/23 11:23 AM  Result Value Ref Range   Glucose-Capillary 173 (H) 70 - 99 mg/dL    Comment: Glucose reference range applies only to samples taken after fasting for at least 8 hours.  Basic metabolic panel     Status: Abnormal   Collection Time: 06/04/23  9:39 AM  Result Value Ref Range   Glucose 190 (H) 70 - 99 mg/dL   BUN 22 6 - 24 mg/dL   Creatinine, Ser  1.53 (H) 0.76 - 1.27 mg/dL   eGFR 53 (L) >01 UU/VOZ/3.66   BUN/Creatinine Ratio 14 9 - 20   Sodium 139 134 - 144 mmol/L   Potassium 4.9 3.5 - 5.2 mmol/L   Chloride 100 96 - 106 mmol/L   CO2 24 20 - 29 mmol/L   Calcium 9.1 8.7 - 10.2 mg/dL  Comprehensive metabolic panel     Status: Abnormal   Collection Time: 06/21/23 11:01 AM  Result Value Ref Range   Sodium 136 135 - 145 mmol/L   Potassium 4.2 3.5 - 5.1 mmol/L   Chloride 98 98 - 111 mmol/L   CO2 27 22 - 32 mmol/L   Glucose, Bld 190 (H) 70 - 99 mg/dL    Comment: Glucose reference range applies only to samples taken after fasting for at least 8 hours.   BUN 18 6 - 20 mg/dL   Creatinine, Ser 4.40 (H) 0.61 - 1.24 mg/dL   Calcium 9.4 8.9 - 34.7 mg/dL   Total Protein 8.3 (H) 6.5 - 8.1 g/dL   Albumin 4.6 3.5 - 5.0 g/dL   AST 17 15 - 41 U/L   ALT 19 0 - 44 U/L   Alkaline Phosphatase 73 38 - 126 U/L   Total Bilirubin 0.7 0.3 - 1.2 mg/dL   GFR, Estimated >42 >59 mL/min    Comment: (NOTE) Calculated using the CKD-EPI Creatinine Equation (2021)    Anion gap 11 5 - 15    Comment: Performed at [facility], [address] [place], [place] [address]  Basic metabolic panel      Status: Abnormal   Collection Time: 07/09/23  9:54 AM  Result Value Ref Range   Sodium 137 135 - 145 mmol/L   Potassium 4.0 3.5 - 5.1 mmol/L   Chloride 102 98 - 111 mmol/L   CO2 24 22 - 32 mmol/L   Glucose, Bld 203 (H) 70 - 99 mg/dL    Comment: Glucose reference range applies only to samples taken after fasting for at least 8 hours.   BUN 16 6 - 20 mg/dL   Creatinine, Ser 5.64 0.61 - 1.24 mg/dL   Calcium 8.9 8.9 - 33.2 mg/dL   GFR, Estimated >95 >18 mL/min    Comment: (NOTE) Calculated using the CKD-EPI Creatinine Equation (2021)    Anion gap 11 5 - 15    Comment: Performed at [facility], [address] [place], [place] [address]  Brain natriuretic peptide     Status: Abnormal   Collection Time: 07/09/23  9:54 AM  Result Value Ref Range   B Natriuretic Peptide 127.8 (H) 0.0 - 100.0 pg/mL    Comment: Performed at [facility], [address], [place], [place] [address]  Troponin I (High Sensitivity)     Status: None   Collection Time: 07/09/23  9:59 AM  Result Value Ref Range   Troponin I (High Sensitivity) 14 <18 ng/L    Comment: (NOTE) Elevated high sensitivity troponin I (hsTnI) values and significant  changes across serial measurements may suggest ACS but many other  chronic and acute conditions are known to elevate hsTnI results.  Refer to the "Links" section for chest pain algorithms and additional  guidance. Performed at [facility], [address] [place], [place] [address]   CK     Status: None   Collection Time: 07/09/23  9:59 AM  Result Value Ref Range   Total CK 155 49 - 397 U/L    Comment:  Performed at [facility], [address] [place], [place] [address]     PHQ2/9:    08/09/2023   10:55 AM 07/26/2023    2:03 PM 06/21/2023    9:42 AM 03/25/2023   11:44 AM 03/21/2023   11:16 AM  Depression screen PHQ 2/9  Decreased Interest 0 0 0 0 0  Down, Depressed, Hopeless 0 0 0 0 0  PHQ - 2 Score 0 0 0 0 0  Altered  sleeping 0 0 0 0 0  Tired, decreased energy 0 0 0 3 0  Change in appetite 0 0 0 1 0  Feeling bad or failure about yourself  0 0 0 0 0  Trouble concentrating 0 0 0 0 0  Moving slowly or fidgety/restless 0 0 0 0 0  Suicidal thoughts 0 0 0 0 0  PHQ-9 Score 0 0 0 4 0  Difficult doing work/chores Not difficult at all Not difficult at all Not difficult at all  Not difficult at all      Fall Risk:    08/09/2023   10:54 AM 07/26/2023    2:02 PM 06/21/2023    9:42 AM 03/25/2023   11:44 AM 03/21/2023   11:16 AM  Fall Risk   Falls in the past year? 0 0 0 1 1  Number falls in past yr: 0 0 0 0 1  Injury with Fall? 0 0 0 1 1  Risk for fall due to : No Fall Risks No Fall Risks No Fall Risks Impaired balance/gait Impaired balance/gait  Follow up Falls prevention discussed;Falls evaluation completed;Education provided Falls prevention discussed;Education provided;Falls evaluation completed Falls prevention discussed;Education provided;Falls evaluation completed Falls prevention discussed Falls prevention discussed;Education provided;Falls evaluation completed      Functional Status Survey: Is the patient deaf or have difficulty hearing?: No Does the patient have difficulty seeing, even when wearing glasses/contacts?: No Does the patient have difficulty concentrating, remembering, or making decisions?: No Does the patient have difficulty walking or climbing stairs?: No Does the patient have difficulty dressing or bathing?: No Does the patient have difficulty doing errands alone such as visiting a doctor's office or shopping?: No    Assessment & Plan  Problem List Items Addressed This Visit       Cardiovascular and Mediastinum   Essential hypertension    Chronic, historic condition Blood pressure is elevated today in office He is currently taking lisinopril 40 mg p.o. daily, spironolactone 25 mg p.o. daily He saw cardiology on 08/07/2023 during which time his carvedilol was increased to 25  mg p.o. twice daily.  He has not started this yet.  Will wait to adjust medications based on response to this medication change He has follow-up with cardiology on 08/21/2023. Reviewed importance of him using an arm cuff to check blood pressure rather than risk of as well as getting 1 of appropriate size for most accurate readings Follow-up in 3 months or sooner if concerns arise      Relevant Orders   COMPLETE METABOLIC PANEL WITH GFR   CBC w/Diff/Platelet   Acute on chronic diastolic congestive heart failure (HCC)    Chronic, ongoing He most recently saw cardiology on 08/07/2023 and medication adjustments were made at that time He is currently taking torsemide 40 mg p.o. daily along with additional 40 mg dosing on Monday Wednesday Fridays.  He is also taking his lisinopril 40 mg p.o. daily, carvedilol 25 mg twice daily, spironolactone 25 mg p.o. daily He appears euvolemic today Continue  current regimen Recommend he keeps upcoming follow up with cardiology  Follow up with PCP in 3 months or sooner if concerns arise         Respiratory   COPD with asthma (HCC)    Chronic, historic condition Patient is currently taking Spiriva once daily and using rescue inhaler as needed He does report some reduction in activity endurance/tolerance He is using rescue inhaler sparingly, mostly for congestion at nighttime He does see pulmonology regularly Hopefully he will be able to restart using CPAP after neck pain is addressed and he is able to sleep more than 2 to 3 hours per night Follow-up in 6 months or sooner if concerns arise        Endocrine   Hyperlipidemia associated with type 2 diabetes mellitus (HCC) - Primary    Chronic, historic condition Patient is currently taking rosuvastatin 40 mg p.o. daily along with Zetia 10 mg p.o. daily and appears to be tolerating well Continue current regimen Recheck lipid panel today, results to dictate further management Follow-up in 6 months or  sooner if concerns arise      Relevant Orders   Lipid Profile   Type 2 diabetes mellitus with hyperglycemia (HCC)    Chronic, historic condition Most recent available A1c was 10.2% in June 2024 He is managed by endocrinology Medication regimen is currently comprised of Jardiance 10 mg PO every day, Trulicity 3mg  weekly injection, Lantus 20 units at bedtime, Lispro sliding scale- usually 8 units, Metformin 2000 mg pO at bedtime Continue current regimen Recheck A1c today Results to dictate further management Follow-up in 3 months and continue collaboration with endocrinology as indicated      Relevant Orders   HgB A1c     Genitourinary   Chronic kidney disease (CKD) stage G2/A3, mildly decreased glomerular filtration rate (GFR) between 60-89 mL/min/1.73 square meter and albuminuria creatinine ratio greater than 300 mg/g    Chronic, historic condition Recheck CMP today, results to dictate further management He is currently taking Jardiance 10 mg p.o. daily and appears to be tolerating well.  He is also taking lisinopril 40 mg p.o. daily Continue current regimen It does not appear that he has seen nephrology since 2021-May need new referral for further monitoring Follow-up in 3 months or sooner if concerns arise      Other Visit Diagnoses     Screening for colon cancer       Relevant Orders   Ambulatory referral to Gastroenterology        Return in about 3 months (around 11/09/2023) for DM, HLD, HTN.   I, Quaron Delacruz E Adjoa Althouse, PA-C, have reviewed all documentation for this visit. The documentation on 08/09/23 for the exam, diagnosis, procedures, and orders are all accurate and complete.   Jacquelin Hawking, MHS, PA-C [facility]

## 2023-08-09 NOTE — Assessment & Plan Note (Signed)
Chronic, historic condition Patient is currently taking Spiriva once daily and using rescue inhaler as needed He does report some reduction in activity endurance/tolerance He is using rescue inhaler sparingly, mostly for congestion at nighttime He does see pulmonology regularly Hopefully he will be able to restart using CPAP after neck pain is addressed and he is able to sleep more than 2 to 3 hours per night Follow-up in 6 months or sooner if concerns arise

## 2023-08-09 NOTE — Assessment & Plan Note (Signed)
Chronic, historic condition Most recent available A1c was 10.2% in June 2024 He is managed by endocrinology Medication regimen is currently comprised of Jardiance 10 mg PO every day, Trulicity 3mg  weekly injection, Lantus 20 units at bedtime, Lispro sliding scale- usually 8 units, Metformin 2000 mg pO at bedtime Continue current regimen Recheck A1c today Results to dictate further management Follow-up in 3 months and continue collaboration with endocrinology as indicated

## 2023-08-09 NOTE — Assessment & Plan Note (Signed)
Chronic, historic condition Recheck CMP today, results to dictate further management He is currently taking Jardiance 10 mg p.o. daily and appears to be tolerating well.  He is also taking lisinopril 40 mg p.o. daily Continue current regimen It does not appear that he has seen nephrology since 2021-May need new referral for further monitoring Follow-up in 3 months or sooner if concerns arise

## 2023-08-09 NOTE — Assessment & Plan Note (Signed)
Chronic, historic condition Blood pressure is elevated today in office He is currently taking lisinopril 40 mg p.o. daily, spironolactone 25 mg p.o. daily He saw cardiology on 08/07/2023 during which time his carvedilol was increased to 25 mg p.o. twice daily.  He has not started this yet.  Will wait to adjust medications based on response to this medication change He has follow-up with cardiology on 08/21/2023. Reviewed importance of him using an arm cuff to check blood pressure rather than risk of as well as getting 1 of appropriate size for most accurate readings Follow-up in 3 months or sooner if concerns arise

## 2023-08-09 NOTE — Assessment & Plan Note (Signed)
Chronic, historic condition Patient is currently taking rosuvastatin 40 mg p.o. daily along with Zetia 10 mg p.o. daily and appears to be tolerating well Continue current regimen Recheck lipid panel today, results to dictate further management Follow-up in 6 months or sooner if concerns arise

## 2023-08-10 LAB — HEMOGLOBIN A1C
Hgb A1c MFr Bld: 9.8 %{Hb} — ABNORMAL HIGH (ref <5.7)
Mean Plasma Glucose: 235 mg/dL
eAG (mmol/L): 13 mmol/L

## 2023-08-10 LAB — LIPID PANEL
Cholesterol: 241 mg/dL — ABNORMAL HIGH (ref <200)
HDL: 38 mg/dL — ABNORMAL LOW (ref >=40)
Non-HDL Cholesterol (Calc): 203 mg/dL — ABNORMAL HIGH (ref <130)
Total CHOL/HDL Ratio: 6.3 (calc) — ABNORMAL HIGH (ref <5.0)
Triglycerides: 404 mg/dL — ABNORMAL HIGH (ref <150)

## 2023-08-10 LAB — CBC WITH DIFFERENTIAL/PLATELET
Absolute Lymphocytes: 2520 {cells}/uL (ref 850–3900)
Absolute Monocytes: 599 {cells}/uL (ref 200–950)
Basophils Absolute: 42 {cells}/uL (ref 0–200)
Basophils Relative: 0.4 %
Eosinophils Absolute: 189 {cells}/uL (ref 15–500)
Eosinophils Relative: 1.8 %
HCT: 50.1 % — ABNORMAL HIGH (ref 38.5–50.0)
Hemoglobin: 16.6 g/dL (ref 13.2–17.1)
MCH: 29.1 pg (ref 27.0–33.0)
MCHC: 33.1 g/dL (ref 32.0–36.0)
MCV: 87.9 fL (ref 80.0–100.0)
MPV: 10.2 fL (ref 7.5–12.5)
Monocytes Relative: 5.7 %
Neutro Abs: 7151 {cells}/uL (ref 1500–7800)
Neutrophils Relative %: 68.1 %
Platelets: 250 10*3/uL (ref 140–400)
RBC: 5.7 10*6/uL (ref 4.20–5.80)
RDW: 13.3 % (ref 11.0–15.0)
Total Lymphocyte: 24 %
WBC: 10.5 10*3/uL (ref 3.8–10.8)

## 2023-08-10 LAB — COMPLETE METABOLIC PANEL WITH GFR
AG Ratio: 1.3 (calc) (ref 1.0–2.5)
ALT: 18 U/L (ref 9–46)
AST: 23 U/L (ref 10–35)
Albumin: 4 g/dL (ref 3.6–5.1)
Alkaline phosphatase (APISO): 80 U/L (ref 35–144)
BUN/Creatinine Ratio: 13 (calc) (ref 6–22)
BUN: 19 mg/dL (ref 7–25)
CO2: 28 mmol/L (ref 20–32)
Calcium: 9 mg/dL (ref 8.6–10.3)
Chloride: 102 mmol/L (ref 98–110)
Creat: 1.42 mg/dL — ABNORMAL HIGH (ref 0.70–1.30)
Globulin: 3.1 g/dL (ref 1.9–3.7)
Glucose, Bld: 214 mg/dL — ABNORMAL HIGH (ref 65–99)
Potassium: 4.2 mmol/L (ref 3.5–5.3)
Sodium: 138 mmol/L (ref 135–146)
Total Bilirubin: 0.7 mg/dL (ref 0.2–1.2)
Total Protein: 7.1 g/dL (ref 6.1–8.1)
eGFR: 58 mL/min/{1.73_m2} — ABNORMAL LOW (ref >=60)

## 2023-08-13 NOTE — Progress Notes (Signed)
Your labs are back Your A1c has improved to 9.8% but this is still much higher than we would like it to be.  I recommend that you follow-up promptly with your endocrinology provider as you should not have an A1c this high with the medications you are on if you are taking them as directed and watching your sugar intake. Your cholesterol has increased since it was last checked 4 months ago.  Please make sure that you are taking your rosuvastatin and Zetia every day as directed.  If you are already doing so we might need to increase 1 of these medications or discuss an injectable to provide further control as you are at an increased risk of heart disease with your current levels Your kidney function remains decreased but stable compared to previous labs Your liver and electrolytes are overall in normal ranges Your CBC is overall normal, no signs of anemia. If you have further questions or concerns please let our office know.  If you would like to discuss further options for managing your diabetes and cholesterol please schedule an appointment with [place] so we can discuss this further

## 2023-08-16 NOTE — Progress Notes (Unsigned)
Cardiology Office Note    Date:  08/21/2023   ID:  Cory Rivas, DOB 1966-04-12, MRN [id number]  PCP:  Danelle Berry, PA-C  Cardiologist:  Debbe Odea, MD  Electrophysiologist:  None   Chief Complaint: Follow-up  History of Present Illness:   Cory Rivas is a 57 y.o. male with history of nonobstructive CAD, HFpEF, CKD stage II, IDDM, HTN, HLD, cervical myelopathy, idiopathic chronic gout, tobacco use for 35+years, asthma, obesity with OSA and tracheobronchomalacia who presents for follow-up of his CAD and HFpEF.   In the setting of preoperative cardiac risk stratification for murmur he underwent Lexiscan MPI in 09/2020 which showed no significant ischemia or coronary artery calcification on CT imaging.  Echo in 10/2020 showed an EF of 60 to 65%, no regional wall motion abnormalities, grade 1 diastolic dysfunction, and no significant valvular abnormalities.  Repeat echo in 08/2022 showed an EF of 60 to 65%, no regional wall motion normalities, mild LVH, grade 2 diastolic dysfunction, normal RV systolic function and ventricular cavity size mild mitral regurgitation, and aortic valve sclerosis without evidence of stenosis.   He was admitted to the hospital in 05/2023 with chest pain and shortness of breath with diaphoresis after eating dinner.  He was markedly hypertensive upon arrival with a blood pressure of 210/103.  High-sensitivity troponin 26 trending to 24.  BNP 199.  Chest x-ray without acute cardiopulmonary process.  Echo on 05/28/2023 showed an EF of 50 to 55%, no regional wall motion abnormalities, moderate LVH, grade 3 diastolic dysfunction, elevated left atrial pressure, normal RV systolic function and ventricular cavity size, mildly dilated left atrium, mild mitral regurgitation, aortic valve sclerosis without evidence of stenosis, and an estimated right atrial pressure of 3 mmHg.  R/LHC on 05/29/2023 showed moderate, nonobstructive CAD with sequential 50 to 60% ostial and  proximal LAD stenoses that were not hemodynamically significant (RFR 0.93).  There was also 40% ostial stenosis of a large OM1 branch.  The nondominant RCA was without significant disease.  Upper normal to mildly elevated left and right heart filling pressures.  Mild pulmonary hypertension.  Borderline low cardiac output/index.  Mild aortic stenosis.  Gentle diuresis and medical therapy/risk factor modification were recommended.    He was most recently seen in hospital follow-up on 06/21/2023 and was without symptoms of angina or cardiac decompensation.  Weight was down 7 pounds by our scale.  He was adherent and tolerating cardiac medications.  He continued to smoke approximately 1/2 pack of cigarettes per day, was only taken 1 or 2 drags than letting the cigarette burn out.  Carvedilol was titrated to 12.5 mg twice daily with continuation of Jardiance, lisinopril, spironolactone, and torsemide.  Since he was last seen in our office he has been followed by the [facility]F clinic, most recently seeing them on 08/07/2023 and reported his CPAP had been taken back as he was not wearing it enough.  No changes were made at that time.  He was recently admitted to the hospital on 11/4 through 11/5 with hypertensive urgency with blood pressure 195/95 with associated chest pressure and dyspnea with cough productive of yellow sputum.  He reported eating some chicken wings leading up to this hospitalization.  Reported adherence to medications.  High-sensitivity troponin 23 with a delta troponin of 22.  D-dimer negative.  BNP 143.  Chest x-ray without acute cardiopulmonary process.  EKG showed sinus rhythm with occasional PACs with known inferolateral T wave inversion.  He reported good urine output with  IV Lasix.  With improvement in blood pressure, and chest pressure also improved.  Mildly elevated troponin felt to be reflective of supply/demand ischemia in the context of volume overload and hypertensive urgency.  Recent  LHC in 05/2023 showed nonobstructive CAD as outlined above.  He comes in today and is without symptoms of angina or cardiac decompensation.  Underlying dyspnea is much improved, now back to baseline.  Reports adherence to medications, though does wonder if he has only been taking torsemide 40 mg once daily rather than torsemide 40 mg daily with an additional 40 mg on Mondays, Wednesdays, and Fridays.  Does consume salty foods at times, had a sausage biscuit with mustard this morning.  No lower extremity swelling, abdominal distention, or orthopnea.  He continues to smoke approximately 1/2 pack/day with letting most of the cigarette burn out.  No dizziness, presyncope, or syncope.    Labs independently reviewed: 07/2023 - Hgb 16.6, PLT 250, BUN 19, serum creatinine 1.42, potassium 4.2, albumin 4.0, AST/ALT normal, TC 241, TG 404, HDL 38, LDL unable to be calculated, A1c 9.8 04/2023 - TSH normal 03/2023 - direct LDL 128  Past Medical History:  Diagnosis Date   Bulging of cervical intervertebral disc    CHF (congestive heart failure) (HCC)    Diabetes mellitus without complication (HCC)    Gout    High cholesterol    Hypertension    Stress due to family tension 04/15/2019    Past Surgical History:  Procedure Laterality Date   CHOLECYSTECTOMY     COLONOSCOPY WITH PROPOFOL N/A 03/10/2020   Procedure: COLONOSCOPY WITH PROPOFOL;  Surgeon: Midge Minium, MD;  Location: [facility] ENDOSCOPY;  Service: Endoscopy;  Laterality: N/A;   CORONARY PRESSURE/FFR STUDY N/A 05/29/2023   Procedure: CORONARY PRESSURE/FFR STUDY;  Surgeon: Yvonne Kendall, MD;  Location: [facility] INVASIVE CV LAB;  Service: Cardiovascular;  Laterality: N/A;   RIGHT/LEFT HEART CATH AND CORONARY ANGIOGRAPHY N/A 05/29/2023   Procedure: RIGHT/LEFT HEART CATH AND CORONARY ANGIOGRAPHY;  Surgeon: Yvonne Kendall, MD;  Location: [facility] INVASIVE CV LAB;  Service: Cardiovascular;  Laterality: N/A;    Current Medications: Current Meds  Medication Sig    allopurinol (ZYLOPRIM) 300 MG tablet TAKE ONE TABLET BY MOUTH DAILY WITH 100 MG TABLET. FOR A TOTAL OF 400 MG DAILY FOR GOUT   amLODipine (NORVASC) 5 MG tablet Take 1 tablet (5 mg total) by mouth daily.   aspirin EC 81 MG tablet Take 1 tablet (81 mg total) by mouth daily. Swallow whole.   carvedilol (COREG) 25 MG tablet Take 1 tablet (25 mg total) by mouth 2 (two) times daily.   DULoxetine (CYMBALTA) 30 MG capsule Take 60 mg (2 capsules) poqam and 30 mg (1 capsule) poqpm daily   empagliflozin (JARDIANCE) 10 MG TABS tablet Take 1 tablet (10 mg total) by mouth daily.   ezetimibe (ZETIA) 10 MG tablet TAKE ONE TABLET BY MOUTH DAILY   fluticasone (FLONASE) 50 MCG/ACT nasal spray Place 1 spray into both nostrils daily. (Patient taking differently: Place 1 spray into both nostrils daily as needed for rhinitis or allergies.)   insulin glargine (LANTUS SOLOSTAR) 100 UNIT/ML Solostar Pen Inject 20 Units into the skin at bedtime.   insulin lispro (HUMALOG) 100 UNIT/ML KwikPen Inject 1-45 Units into the skin 3 (three) times daily. Sliding scale up to 45 units per injection   ipratropium-albuterol (DUONEB) 0.5-2.5 (3) MG/3ML SOLN Take 3 mLs by nebulization every 6 (six) hours as needed (SOB wheeze cough).   levocetirizine (XYZAL) 5 MG tablet  TAKE ONE TABLET BY MOUTH EVERY EVENING   lisinopril (ZESTRIL) 40 MG tablet Take 1 tablet (40 mg total) by mouth daily.   metformin (FORTAMET) 500 MG (OSM) 24 hr tablet Take 2,000 mg by mouth at bedtime.   nitroGLYCERIN (NITROSTAT) 0.4 MG SL tablet Place 1 tablet (0.4 mg total) under the tongue every 5 (five) minutes as needed for chest pain.   pregabalin (LYRICA) 50 MG capsule Take 1 capsule (50 mg total) by mouth 2 (two) times daily.   rosuvastatin (CRESTOR) 40 MG tablet TAKE ONE TABLET BY MOUTH DAILY   spironolactone (ALDACTONE) 25 MG tablet Take 1 tablet (25 mg total) by mouth daily.   Tiotropium Bromide Monohydrate (SPIRIVA RESPIMAT) 2.5 MCG/ACT AERS Inhale 2 puffs  into the lungs daily.   TRULICITY 3 MG/0.5ML SOPN Inject 3 mg into the skin once a week. SATURDAYS   VENTOLIN HFA 108 (90 Base) MCG/ACT inhaler INHALE TWO PUFFS INTO THE LUNGS EVERY SIX HOURS AS NEEDED FOR WHEEZING OR SHORTNESS OF BREATH   [DISCONTINUED] Torsemide 40 MG TABS Take 40 mg by mouth in the morning.    Allergies:   Oxycodone, Entresto [sacubitril-valsartan], Onion, and Nitroglycerin   Social History   Socioeconomic History   Marital status: Divorced    Spouse name: Not on file   Number of children: 0   Years of education: 9   Highest education level: 9th grade  Occupational History   Occupation: disability  Tobacco Use   Smoking status: Some Days    Current packs/day: 0.00    Average packs/day: 1 pack/day for 37.0 years (37.0 ttl pk-yrs)    Types: Cigarettes    Start date: 10/19/1989    Last attempt to quit: 08/20/2022    Years since quitting: 1.0   Smokeless tobacco: Never   Tobacco comments:    30+ years hx smoking 2 ppd, stopped smoking briefly in April 2022 for a surgery and then restarted smoking about 1/2 ppd last 4-5 months         5-6cigs daily- 04/10/2023  Vaping Use   Vaping status: Never Used  Substance and Sexual Activity   Alcohol use: No   Drug use: No   Sexual activity: Not Currently    Partners: Female  Other Topics Concern   Not on file  Social History Narrative   Lives with sister [name], brother in law [name]), and his mother - Ervan Heber   Social Determinants of Health   Financial Resource Strain: Medium Risk (12/06/2022)   Overall Financial Resource Strain (CARDIA)    Difficulty of Paying Living Expenses: Somewhat hard  Food Insecurity: No Food Insecurity (08/20/2023)   Hunger Vital Sign    Worried About Running Out of Food in the Last Year: Never true    Ran Out of Food in the Last Year: Never true  Recent Concern: Food Insecurity - Food Insecurity Present (05/28/2023)   Hunger Vital Sign    Worried About Running Out of Food in the Last  Year: Often true    Ran Out of Food in the Last Year: Often true  Transportation Needs: No Transportation Needs (08/20/2023)   PRAPARE - Administrator, Civil Service (Medical): No    Lack of Transportation (Non-Medical): No  Physical Activity: Insufficiently Active (12/06/2022)   Exercise Vital Sign    Days of Exercise per Week: 3 days    Minutes of Exercise per Session: 10 min  Stress: No Stress Concern Present (12/06/2022)   [employer] of  Occupational Health - Occupational Stress Questionnaire    Feeling of Stress : Only a little  Social Connections: Socially Isolated (12/06/2022)   Social Connection and Isolation Panel [NHANES]    Frequency of Communication with Friends and Family: More than three times a week    Frequency of Social Gatherings with Friends and Family: Twice a week    Attends Religious Services: Never    Database administrator or Organizations: No    Attends Engineer, structural: Never    Marital Status: Divorced     Family History:  The patient's family history includes AAA (abdominal aortic aneurysm) in his maternal grandmother; Diabetes in his maternal grandfather and mother; Heart attack in his maternal grandmother and mother; Heart disease in his mother; Hyperlipidemia in his mother; Hypertension in his father and mother; Lung cancer in his father; Stroke in his father.  ROS:   12-point review of systems is negative unless otherwise noted in the HPI.   EKGs/Labs/Other Studies Reviewed:    Studies reviewed were summarized above. The additional studies were reviewed today:  [facility] 05/29/2023: Conclusions: Moderate, nonobstructive coronary artery disease with sequential 50-60% ostial and proximal LAD stenoses that are not hemodynamically significant (RFR = 0.93).  There is also a 40% ostial stenosis of a large first OM branch.  Nondominant RCA is without significant disease. Upper normal to mildly elevated left and right heart filling  pressures. Mild pulmonary hypertension. Borderline low Fick cardiac output/index. Mild aortic stenosis.   Recommendations: Resume gentle diuresis tomorrow if renal function is stable. Medical therapy and risk factor modification to prevent progression of coronary artery disease. __________   2D echo 05/28/2023: 1. Left ventricular ejection fraction, by estimation, is 50 to 55%. The  left ventricle has low normal function. The left ventricle has no regional  wall motion abnormalities. There is moderate left ventricular hypertrophy.  Left ventricular diastolic  parameters are consistent with Grade III diastolic dysfunction  (restrictive). Elevated left atrial pressure. The average left ventricular  global longitudinal strain is -10.0 %. The global longitudinal strain is  abnormal.   2. Right ventricular systolic function is normal. The right ventricular  size is normal. Tricuspid regurgitation signal is inadequate for assessing  PA pressure.   3. Left atrial size was mildly dilated.   4. The mitral valve was not well visualized. Mild mitral valve  regurgitation.   5. The aortic valve has an indeterminant number of cusps. There is  moderate calcification of the aortic valve. There is moderate thickening  of the aortic valve. Aortic valve regurgitation is not visualized. Aortic  valve sclerosis/calcification is  present, without any evidence of aortic stenosis.   6. The inferior vena cava is normal in size with greater than 50%  respiratory variability, suggesting right atrial pressure of 3 mmHg.  __________   2D echo 08/23/2022: 1. Left ventricular ejection fraction, by estimation, is 60 to 65%. The  left ventricle has normal function. The left ventricle has no regional  wall motion abnormalities. There is mild left ventricular hypertrophy.  Left ventricular diastolic parameters  are consistent with Grade II diastolic dysfunction (pseudonormalization).   2. Right ventricular  systolic function is normal. The right ventricular  size is normal.   3. The mitral valve is normal in structure. Mild mitral valve  regurgitation. No evidence of mitral stenosis.   4. The aortic valve is calcified. Aortic valve regurgitation is not  visualized. Aortic valve sclerosis/calcification is present, without any  evidence of aortic  stenosis.   5. The inferior vena cava is normal in size with greater than 50%  respiratory variability, suggesting right atrial pressure of 3 mmHg.  __________   2D echo 11/04/2020: 1. Left ventricular ejection fraction, by estimation, is 60 to 65%. The  left ventricle has normal function. The left ventricle has no regional  wall motion abnormalities. Left ventricular diastolic parameters are  consistent with Grade I diastolic  dysfunction (impaired relaxation).There is mild left ventricular  hypertrophy of the basal segment. LVOT gradient of 10 mm Hg at rest, 22 mm  Hg gradient with valsalva   2. Right ventricular systolic function is normal. The right ventricular  size is normal. There is normal pulmonary artery systolic pressure. The  estimated right ventricular systolic pressure is 31.6 mmHg.   3. Left atrial size was mildly dilated.  ____________   Eugenie Birks MPI 10/10/2020: Low risk, probably normal pharmacologic myocardial perfusion stress tst. There is a small in size, moderate in severity, fixed defect at the apex most likely representing artifact (apical thinning and attenuation) and less likely scar. There is no evidence of significant ischemia. The left ventricular ejection fraction is normal (59%). There is no significant coronary artery calcification on the attenuation correction CT.   EKG:  EKG is not ordered today.    Recent Labs: 08/09/2023: ALT 18 08/19/2023: B Natriuretic Peptide 143.9 08/20/2023: BUN 24; Creatinine, Ser 1.34; Hemoglobin 15.7; Platelets 203; Potassium 3.6; Sodium 136  Recent Lipid Panel    Component Value  Date/Time   CHOL 241 (H) 08/09/2023 1156   TRIG 404 (H) 08/09/2023 1156   HDL 38 (L) 08/09/2023 1156   CHOLHDL 6.3 (H) 08/09/2023 1156   VLDL 53 (H) 06/08/2022 1534   LDLCALC  08/09/2023 1156     Comment:     . LDL cholesterol not calculated. Triglyceride levels greater than 400 mg/dL invalidate calculated LDL results. . Reference range: <100 . Desirable range <100 mg/dL for primary prevention;   <70 mg/dL for patients with CHD or diabetic patients  with > or = 2 CHD risk factors. Marland Kitchen LDL-C is now calculated using the Martin-Hopkins  calculation, which is a validated novel method providing  better accuracy than the Friedewald equation in the  estimation of LDL-C.  Horald Pollen et al. Lenox Ahr. 1610;960(45): 2061-2068  (http://education.[facility].com/faq/FAQ164)    LDLDIRECT 147 (H) 08/19/2023 2222    PHYSICAL EXAM:    VS:  BP 130/78 (BP Location: Left Arm, Patient Position: Sitting, Cuff Size: Normal)   Pulse (!) 58   Ht 5\' 6"  (1.676 m)   Wt 223 lb 12.8 oz (101.5 kg)   SpO2 94%   BMI 36.12 kg/m   BMI: Body mass index is 36.12 kg/m.  Physical Exam Vitals reviewed.  Constitutional:      Appearance: He is well-developed.  HENT:     Head: Normocephalic and atraumatic.  Eyes:     General:        Right eye: No discharge.        Left eye: No discharge.  Neck:     Comments: JVD difficult to assess secondary to body habitus. Cardiovascular:     Rate and Rhythm: Normal rate and regular rhythm.     Pulses:          Posterior tibial pulses are 2+ on the right side and 2+ on the left side.     Heart sounds: S1 normal and S2 normal. Heart sounds not distant. No midsystolic click and no opening snap. Murmur  heard.     Systolic murmur is present with a grade of 1/6 at the upper right sternal border.     No friction rub.  Pulmonary:     Effort: Pulmonary effort is normal. No respiratory distress.     Breath sounds: Normal breath sounds. No decreased breath sounds, wheezing,  rhonchi or rales.  Chest:     Chest wall: No tenderness.  Abdominal:     General: There is no distension.  Musculoskeletal:     Cervical back: Normal range of motion.     Right lower leg: No edema.     Left lower leg: No edema.  Skin:    General: Skin is warm and dry.     Nails: There is no clubbing.  Neurological:     Mental Status: He is alert and oriented to person, place, and time.  Psychiatric:        Speech: Speech normal.        Behavior: Behavior normal.        Thought Content: Thought content normal.        Judgment: Judgment normal.     Wt Readings from Last 3 Encounters:  08/21/23 223 lb 12.8 oz (101.5 kg)  08/20/23 225 lb 15.5 oz (102.5 kg)  08/09/23 228 lb 11.2 oz (103.7 kg)     ASSESSMENT & PLAN:   Nonobstructive CAD with elevated high-sensitivity troponin: Currently without symptoms of angina or cardiac decompensation.  Recent LHC in 05/2023 showed nonobstructive disease as outlined above.  Suspect mildly elevated and flat trending high-sensitivity troponin during admission earlier this month is secondary to hypertensive urgency.  Continue aggressive risk factor modification and primary prevention including aspirin, carvedilol, amlodipine, ezetimibe, lisinopril, rosuvastatin, and as needed SL NTG.  No plans for further ischemic testing at this time.  HFpEF: Euvolemic and well compensated with NYHA class II symptoms.  Remains active at baseline.  Suspect his recent admission was in the context of hypertensive urgency.  Recommend continued optimal blood pressure control.  Unable to transition from ACE inhibitor to Entresto secondary to intolerance/allergy with associated dizziness and profound nausea/vomiting while on Entresto previously.  Continue carvedilol 25 mg twice daily, Jardiance, lisinopril, and spironolactone.  Transition patient back to torsemide 40 mg daily with an additional 40 mg on Mondays, Wednesdays, and Fridays.  Recommend low-sodium diet.  Check BMP in 1  week with transitioning of torsemide back to prior dose.  HTN: Blood pressure is reasonably controlled in the office today.  Continue current medical therapy including amlodipine 10 mg, carvedilol 25 mg twice daily, lisinopril 40 mg.  HLD: LDL 128 in 03/2023 with goal being less than 70.  Remains on rosuvastatin 40 mg and ezetimibe.  Not currently fasting, had sausage biscuit on the way to his appointment today.  Needs follow-up fasting labs at next visit.  If LDL remains above goal would need to consider initiating this K9 inhibitor versus bempedoic acid.  Obesity with asthma, OSA, and tracheobronchomalacia: Suspect this is contributing to his overall presentation.  Weight loss is encouraged throughout healthy diet and regular exercise.  Does not currently have a CPAP due to lack of adherence.  Recommend he follow-up with prescribing provider for ongoing management of sleep medicine.    Disposition: F/u with Dr. Azucena Cecil or an APP in 2 months.   Medication Adjustments/Labs and Tests Ordered: Current medicines are reviewed at length with the patient today.  Concerns regarding medicines are outlined above. Medication changes, Labs and Tests ordered today are  summarized above and listed in the Patient Instructions accessible in Encounters.   Signed, Eula Listen, PA-C 08/21/2023 12:56 PM     [facility] [place] [address] [place], [place] [address] [phone]

## 2023-08-19 ENCOUNTER — Emergency Department: Payer: Medicaid Other

## 2023-08-19 ENCOUNTER — Encounter: Payer: Self-pay | Admitting: *Deleted

## 2023-08-19 ENCOUNTER — Observation Stay
Admission: EM | Admit: 2023-08-19 | Discharge: 2023-08-20 | Disposition: A | Discharge status: Home / Self Care | Payer: Medicaid Other | Attending: Emergency Medicine | Admitting: Emergency Medicine

## 2023-08-19 ENCOUNTER — Other Ambulatory Visit: Payer: Self-pay

## 2023-08-19 ENCOUNTER — Ambulatory Visit (INDEPENDENT_AMBULATORY_CARE_PROVIDER_SITE_OTHER): Payer: Medicaid Other | Admitting: Podiatry

## 2023-08-19 DIAGNOSIS — M109 Gout, unspecified: Secondary | ICD-10-CM | POA: Diagnosis not present

## 2023-08-19 DIAGNOSIS — N182 Chronic kidney disease, stage 2 (mild): Secondary | ICD-10-CM | POA: Diagnosis present

## 2023-08-19 DIAGNOSIS — Z720 Tobacco use: Secondary | ICD-10-CM | POA: Diagnosis present

## 2023-08-19 DIAGNOSIS — I2510 Atherosclerotic heart disease of native coronary artery without angina pectoris: Secondary | ICD-10-CM | POA: Diagnosis present

## 2023-08-19 DIAGNOSIS — E66812 Obesity, class 2: Secondary | ICD-10-CM | POA: Insufficient documentation

## 2023-08-19 DIAGNOSIS — Z1152 Encounter for screening for COVID-19: Secondary | ICD-10-CM | POA: Diagnosis not present

## 2023-08-19 DIAGNOSIS — R197 Diarrhea, unspecified: Secondary | ICD-10-CM | POA: Diagnosis not present

## 2023-08-19 DIAGNOSIS — I160 Hypertensive urgency: Secondary | ICD-10-CM | POA: Diagnosis not present

## 2023-08-19 DIAGNOSIS — I5A Non-ischemic myocardial injury (non-traumatic): Secondary | ICD-10-CM | POA: Diagnosis present

## 2023-08-19 DIAGNOSIS — J4489 Other specified chronic obstructive pulmonary disease: Secondary | ICD-10-CM | POA: Diagnosis not present

## 2023-08-19 DIAGNOSIS — E669 Obesity, unspecified: Secondary | ICD-10-CM | POA: Diagnosis not present

## 2023-08-19 DIAGNOSIS — I161 Hypertensive emergency: Principal | ICD-10-CM | POA: Diagnosis present

## 2023-08-19 DIAGNOSIS — Z7901 Long term (current) use of anticoagulants: Secondary | ICD-10-CM | POA: Diagnosis not present

## 2023-08-19 DIAGNOSIS — F17210 Nicotine dependence, cigarettes, uncomplicated: Secondary | ICD-10-CM | POA: Diagnosis not present

## 2023-08-19 DIAGNOSIS — I130 Hypertensive heart and chronic kidney disease with heart failure and stage 1 through stage 4 chronic kidney disease, or unspecified chronic kidney disease: Secondary | ICD-10-CM | POA: Insufficient documentation

## 2023-08-19 DIAGNOSIS — E1122 Type 2 diabetes mellitus with diabetic chronic kidney disease: Secondary | ICD-10-CM | POA: Diagnosis not present

## 2023-08-19 DIAGNOSIS — J45909 Unspecified asthma, uncomplicated: Secondary | ICD-10-CM | POA: Diagnosis not present

## 2023-08-19 DIAGNOSIS — E785 Hyperlipidemia, unspecified: Secondary | ICD-10-CM | POA: Diagnosis present

## 2023-08-19 DIAGNOSIS — I5032 Chronic diastolic (congestive) heart failure: Secondary | ICD-10-CM | POA: Diagnosis present

## 2023-08-19 DIAGNOSIS — R079 Chest pain, unspecified: Secondary | ICD-10-CM | POA: Diagnosis present

## 2023-08-19 DIAGNOSIS — E1129 Type 2 diabetes mellitus with other diabetic kidney complication: Secondary | ICD-10-CM | POA: Diagnosis present

## 2023-08-19 DIAGNOSIS — Z6836 Body mass index (BMI) 36.0-36.9, adult: Secondary | ICD-10-CM | POA: Diagnosis not present

## 2023-08-19 DIAGNOSIS — Z91199 Patient's noncompliance with other medical treatment and regimen due to unspecified reason: Secondary | ICD-10-CM

## 2023-08-19 LAB — BETA-HYDROXYBUTYRIC ACID: Beta-Hydroxybutyric Acid: 0.08 mmol/L (ref 0.05–0.27)

## 2023-08-19 LAB — CBG MONITORING, ED
Glucose-Capillary: 416 mg/dL — ABNORMAL HIGH (ref 70–99)
Glucose-Capillary: 209 mg/dL — ABNORMAL HIGH (ref 70–99)

## 2023-08-19 LAB — CBC
HCT: 44.6 % (ref 39.0–52.0)
Hemoglobin: 15.8 g/dL (ref 13.0–17.0)
MCH: 29.2 pg (ref 26.0–34.0)
MCHC: 35.4 g/dL (ref 30.0–36.0)
MCV: 82.3 fL (ref 80.0–100.0)
Platelets: 183 10*3/uL (ref 150–400)
RBC: 5.42 MIL/uL (ref 4.22–5.81)
RDW: 13.3 % (ref 11.5–15.5)
WBC: 7.9 10*3/uL (ref 4.0–10.5)
nRBC: 0 % (ref 0.0–0.2)

## 2023-08-19 LAB — BASIC METABOLIC PANEL
Anion gap: 9 (ref 5–15)
BUN: 20 mg/dL (ref 6–20)
CO2: 26 mmol/L (ref 22–32)
Calcium: 8.6 mg/dL — ABNORMAL LOW (ref 8.9–10.3)
Chloride: 100 mmol/L (ref 98–111)
Creatinine, Ser: 1.26 mg/dL — ABNORMAL HIGH (ref 0.61–1.24)
GFR, Estimated: >60 mL/min (ref >60)
Glucose, Bld: 409 mg/dL — ABNORMAL HIGH (ref 70–99)
Potassium: 3.6 mmol/L (ref 3.5–5.1)
Sodium: 135 mmol/L (ref 135–145)

## 2023-08-19 LAB — PROTIME-INR
INR: 1.1 (ref 0.8–1.2)
Prothrombin Time: 14.2 s (ref 11.4–15.2)

## 2023-08-19 LAB — RESP PANEL BY RT-PCR (RSV, FLU A&B, COVID)  RVPGX2
Influenza A by PCR: NEGATIVE
Influenza B by PCR: NEGATIVE
Resp Syncytial Virus by PCR: NEGATIVE
SARS Coronavirus 2 by RT PCR: NEGATIVE

## 2023-08-19 LAB — D-DIMER, QUANTITATIVE: D-Dimer, Quant: 0.3 ug{FEU}/mL (ref 0.00–0.50)

## 2023-08-19 LAB — TROPONIN I (HIGH SENSITIVITY)
Troponin I (High Sensitivity): 23 ng/L — ABNORMAL HIGH (ref <18)
Troponin I (High Sensitivity): 22 ng/L — ABNORMAL HIGH (ref <18)
Troponin I (High Sensitivity): 22 ng/L — ABNORMAL HIGH (ref <18)

## 2023-08-19 LAB — BRAIN NATRIURETIC PEPTIDE: B Natriuretic Peptide: 143.9 pg/mL — ABNORMAL HIGH (ref 0.0–100.0)

## 2023-08-19 LAB — APTT: aPTT: 28 s (ref 24–36)

## 2023-08-19 MED ORDER — FUROSEMIDE 10 MG/ML IJ SOLN
40.0000 mg | Freq: Once | INTRAMUSCULAR | Status: AC
  Administered 2023-08-19: 40 mg via INTRAVENOUS
  Filled 2023-08-19: qty 4

## 2023-08-19 MED ORDER — IPRATROPIUM-ALBUTEROL 0.5-2.5 (3) MG/3ML IN SOLN: 3.0000 mL | Freq: Once | RESPIRATORY_TRACT | Status: DC

## 2023-08-19 MED ORDER — DM-GUAIFENESIN ER 30-600 MG PO TB12: 1.0000 | ORAL_TABLET | Freq: Two times a day (BID) | ORAL | Status: DC | PRN

## 2023-08-19 MED ORDER — HYDRALAZINE HCL 20 MG/ML IJ SOLN: 10.0000 mg | INTRAMUSCULAR | Status: DC | PRN

## 2023-08-19 MED ORDER — INSULIN ASPART 100 UNIT/ML IJ SOLN
10.0000 [IU] | Freq: Once | INTRAMUSCULAR | Status: AC
  Administered 2023-08-19: 10 [IU] via SUBCUTANEOUS
  Filled 2023-08-19: qty 1

## 2023-08-19 MED ORDER — ACETAMINOPHEN 325 MG PO TABS: 650.0000 mg | ORAL_TABLET | Freq: Four times a day (QID) | ORAL | Status: DC | PRN

## 2023-08-19 MED ORDER — ASPIRIN 81 MG PO TBEC
81.0000 mg | DELAYED_RELEASE_TABLET | Freq: Every day | ORAL | Status: DC
  Administered 2023-08-20: 81 mg via ORAL
  Filled 2023-08-19: qty 1

## 2023-08-19 MED ORDER — NICOTINE 21 MG/24HR TD PT24
21.0000 mg | MEDICATED_PATCH | Freq: Every day | TRANSDERMAL | Status: DC
  Administered 2023-08-19 – 2023-08-20 (×2): 21 mg via TRANSDERMAL
  Filled 2023-08-19 (×2): qty 1

## 2023-08-19 MED ORDER — ALBUTEROL SULFATE (2.5 MG/3ML) 0.083% IN NEBU: 2.5000 mg | INHALATION_SOLUTION | RESPIRATORY_TRACT | Status: DC | PRN

## 2023-08-19 MED ORDER — ENOXAPARIN SODIUM 60 MG/0.6ML IJ SOSY
0.5000 mg/kg | PREFILLED_SYRINGE | INTRAMUSCULAR | Status: DC
  Administered 2023-08-19: 52.5 mg via SUBCUTANEOUS
  Filled 2023-08-19: qty 0.6

## 2023-08-19 MED ORDER — ONDANSETRON HCL 4 MG/2ML IJ SOLN: 4.0000 mg | Freq: Three times a day (TID) | INTRAMUSCULAR | Status: DC | PRN

## 2023-08-19 MED ORDER — ASPIRIN 81 MG PO CHEW
324.0000 mg | CHEWABLE_TABLET | Freq: Once | ORAL | Status: AC
  Administered 2023-08-19: 324 mg via ORAL
  Filled 2023-08-19: qty 4

## 2023-08-19 MED ORDER — INSULIN ASPART 100 UNIT/ML IJ SOLN
0.0000 [IU] | Freq: Every day | INTRAMUSCULAR | Status: DC
Start: 2023-08-19 — End: 2023-08-20

## 2023-08-19 MED ORDER — IPRATROPIUM-ALBUTEROL 0.5-2.5 (3) MG/3ML IN SOLN
3.0000 mL | RESPIRATORY_TRACT | Status: DC
  Administered 2023-08-19 – 2023-08-20 (×3): 3 mL via RESPIRATORY_TRACT
  Filled 2023-08-19 (×3): qty 3

## 2023-08-19 MED ORDER — FENTANYL CITRATE PF 50 MCG/ML IJ SOSY: 25.0000 ug | PREFILLED_SYRINGE | INTRAMUSCULAR | Status: DC | PRN

## 2023-08-19 MED ORDER — INSULIN ASPART 100 UNIT/ML IJ SOLN
0.0000 [IU] | Freq: Three times a day (TID) | INTRAMUSCULAR | Status: DC
  Administered 2023-08-20: 5 [IU] via SUBCUTANEOUS
  Filled 2023-08-19: qty 1

## 2023-08-19 MED ORDER — LABETALOL HCL 5 MG/ML IV SOLN: 5.0000 mg | Freq: Once | INTRAVENOUS | Status: DC

## 2023-08-19 MED ORDER — INSULIN GLARGINE-YFGN 100 UNIT/ML ~~LOC~~ SOLN
20.0000 [IU] | Freq: Every day | SUBCUTANEOUS | Status: DC
  Administered 2023-08-19: 20 [IU] via SUBCUTANEOUS
  Filled 2023-08-19 (×2): qty 0.2

## 2023-08-19 NOTE — H&P (Signed)
History and Physical    Cory Rivas WUJ:[id number] DOB: 02-Jul-1966 DOA: 08/19/2023  Referring MD/NP/PA:   PCP: Danelle Berry, PA-C   Patient coming from:  The patient is coming from home.     Chief Complaint: chest pain  HPI: Cory Rivas is a 57 y.o. male with medical history significant of HTN, HLD, DM, CAD, COPD/asthma, dCHF, gout, CKD-2, tobacco abuse, depression, who presents with chest pain.  Patient states that he has intermittent chest pain the chest pressure for more than 2 days. It becomes more constant over the past 12 hours.  The chest pain is located in the front chest, pressure-like, mild to moderate, nonradiating, aggravated with deep breath.  Patient has shortness breath and cough with little yellow sputum production.  No fever or chills.  Patient is 1 episode of watery bowel movement in ED, no diarrhea at home, no nausea, vomiting or abdominal pain.  No symptoms of UTI.  No recent long distance traveling. Of note,  patient had admission for hypertensive urgency back in August 2024. He had cardiac cath on 8/14 which showed nonobstructive CAD.  Cardiac cath by Dr. Okey Dupre on 05/29/23  Moderate, nonobstructive coronary artery disease with sequential 50-60% ostial and proximal LAD stenoses that are not hemodynamically significant (RFR = 0.93).  There is also a 40% ostial stenosis of a large first OM branch.  Nondominant RCA is without significant disease. Upper normal to mildly elevated left and right heart filling pressures. Mild pulmonary hypertension. Borderline low Fick cardiac output/index. Mild aortic stenosis.    Data reviewed independently and ED Course: pt was found to have troponin 23 --> 22, WBC 7.9, stable renal function, negative PCR for COVID, flu and RSV.  Temperature normal, blood pressure 195/98  --> 151/97, heart rate 77, RR 18, oxygen saturation 96% room air.  Chest x-ray negative.  Patient is placed on telemetry bed for observation.  EKG: I have personally  reviewed.  Sinus rhythm, QTc 413, frequent PAC, T wave inversion in inferior leads, V4-V5.  Review of Systems:   General: no fevers, chills, no body weight gain, has fatigue HEENT: no blurry vision, hearing changes or sore throat Respiratory: has dyspnea, coughing CV: has chest pain/ chest  pressure, no palpitations GI: no nausea, vomiting, abdominal pain, has diarrhea, no constipation GU: no dysuria, burning on urination, increased urinary frequency, hematuria  Ext: no leg edema Neuro: no unilateral weakness, numbness, or tingling, no vision change or hearing loss Skin: no rash, no skin tear. MSK: No muscle spasm, no deformity, no limitation of range of movement in spin Heme: No easy bruising.  Travel history: No recent long distant travel.   Allergy:  Allergies  Allergen Reactions   Oxycodone Nausea And Vomiting   Entresto [Sacubitril-Valsartan] Nausea Only and Other (See Comments)    Lightheaded and dizzy    Onion Nausea And Vomiting   Nitroglycerin Itching and Rash    Past Medical History:  Diagnosis Date   Bulging of cervical intervertebral disc    CHF (congestive heart failure) (HCC)    Diabetes mellitus without complication (HCC)    Gout    High cholesterol    Hypertension    Stress due to family tension 04/15/2019    Past Surgical History:  Procedure Laterality Date   CHOLECYSTECTOMY     COLONOSCOPY WITH PROPOFOL N/A 03/10/2020   Procedure: COLONOSCOPY WITH PROPOFOL;  Surgeon: Midge Minium, MD;  Location: [facility] ENDOSCOPY;  Service: Endoscopy;  Laterality: N/A;   CORONARY PRESSURE/FFR STUDY  N/A 05/29/2023   Procedure: CORONARY PRESSURE/FFR STUDY;  Surgeon: Yvonne Kendall, MD;  Location: [facility] INVASIVE CV LAB;  Service: Cardiovascular;  Laterality: N/A;   RIGHT/LEFT HEART CATH AND CORONARY ANGIOGRAPHY N/A 05/29/2023   Procedure: RIGHT/LEFT HEART CATH AND CORONARY ANGIOGRAPHY;  Surgeon: Yvonne Kendall, MD;  Location: [facility] INVASIVE CV LAB;  Service: Cardiovascular;   Laterality: N/A;    Social History:  reports that he has been smoking cigarettes. He started smoking about 33 years ago. He has a 37 pack-year smoking history. He has never used smokeless tobacco. He reports that he does not drink alcohol and does not use drugs.  Family History:  Family History  Problem Relation Age of Onset   Heart disease Mother    Diabetes Mother    Hyperlipidemia Mother    Hypertension Mother    Heart attack Mother    Lung cancer Father    Hypertension Father    Stroke Father    AAA (abdominal aortic aneurysm) Maternal Grandmother    Heart attack Maternal Grandmother    Diabetes Maternal Grandfather      Prior to Admission medications   Medication Sig Start Date End Date Taking? Authorizing Provider  allopurinol (ZYLOPRIM) 300 MG tablet TAKE ONE TABLET BY MOUTH DAILY WITH 100 MG TABLET. FOR A TOTAL OF 400 MG DAILY FOR GOUT 06/21/23   Danelle Berry, PA-C  carvedilol (COREG) 25 MG tablet Take 1 tablet (25 mg total) by mouth 2 (two) times daily. 08/07/23 11/05/23  Delma Freeze, FNP  DULoxetine (CYMBALTA) 30 MG capsule Take 60 mg (2 capsules) poqam and 30 mg (1 capsule) poqpm daily 06/24/23   Danelle Berry, PA-C  empagliflozin (JARDIANCE) 10 MG TABS tablet Take 1 tablet (10 mg total) by mouth daily. 06/21/23   Danelle Berry, PA-C  ezetimibe (ZETIA) 10 MG tablet TAKE ONE TABLET BY MOUTH DAILY 01/11/23   Danelle Berry, PA-C  fluticasone (FLONASE) 50 MCG/ACT nasal spray Place 1 spray into both nostrils daily. Patient taking differently: Place 1 spray into both nostrils daily as needed for rhinitis or allergies. 01/23/23 01/23/24  Danelle Berry, PA-C  insulin glargine (LANTUS SOLOSTAR) 100 UNIT/ML Solostar Pen Inject 20 Units into the skin at bedtime. 11/01/20   [provider]  insulin lispro (HUMALOG) 100 UNIT/ML KwikPen Inject 1-45 Units into the skin 3 (three) times daily. Sliding scale up to 45 units per injection 11/08/20   [provider]  ipratropium-albuterol  (DUONEB) 0.5-2.5 (3) MG/3ML SOLN Take 3 mLs by nebulization every 6 (six) hours as needed (SOB wheeze cough). 07/23/22   Danelle Berry, PA-C  levocetirizine (XYZAL) 5 MG tablet TAKE ONE TABLET BY MOUTH EVERY EVENING 01/11/23   Danelle Berry, PA-C  lisinopril (ZESTRIL) 40 MG tablet Take 1 tablet (40 mg total) by mouth daily. 02/21/23   Debbe Odea, MD  metformin (FORTAMET) 500 MG (OSM) 24 hr tablet Take 2,000 mg by mouth at bedtime.    [provider]  nitroGLYCERIN (NITROSTAT) 0.4 MG SL tablet Place 1 tablet (0.4 mg total) under the tongue every 5 (five) minutes as needed for chest pain. 05/30/23 08/07/23  Leeroy Bock, MD  pantoprazole (PROTONIX) 40 MG tablet Take 40 mg by mouth daily.    [provider]  pregabalin (LYRICA) 50 MG capsule Take 1 capsule (50 mg total) by mouth 2 (two) times daily. 02/22/23   Danelle Berry, PA-C  rosuvastatin (CRESTOR) 40 MG tablet TAKE ONE TABLET BY MOUTH DAILY 01/11/23   Danelle Berry, PA-C  spironolactone (ALDACTONE)  25 MG tablet Take 1 tablet (25 mg total) by mouth daily. 06/04/23 09/02/23  Delma Freeze, FNP  Tiotropium Bromide Monohydrate (SPIRIVA RESPIMAT) 2.5 MCG/ACT AERS Inhale 2 puffs into the lungs daily. 09/24/22   Raechel Chute, MD  Torsemide 40 MG TABS Take 40 mg by mouth in the morning. 07/11/23 08/10/23  Delma Freeze, FNP  TRULICITY 3 MG/0.5ML SOPN Inject 3 mg into the skin once a week. SATURDAYS 03/26/22   [provider]  VENTOLIN HFA 108 (90 Base) MCG/ACT inhaler INHALE TWO PUFFS INTO THE LUNGS EVERY SIX HOURS AS NEEDED FOR WHEEZING OR SHORTNESS OF BREATH 04/26/23   Danelle Berry, PA-C    Physical Exam: Vitals:   08/19/23 1504 08/19/23 1506 08/19/23 2206  BP:  (!) 195/98 (!) 151/97  Pulse: 77  61  Resp: 18  18  Temp:  98.1 F (36.7 C) 97.9 F (36.6 C)  TempSrc:  Oral Oral  SpO2: 96%  96%  Weight: 103.7 kg    Height: 5' 6.5" (1.689 m)     General: Not in acute distress HEENT:       Eyes: PERRL, EOMI, no  jaundice       ENT: No discharge from the ears and nose, no pharynx injection, no tonsillar enlargement.        Neck: Difficult to assess JVD due to obesity no bruit, no mass felt. Heme: No neck lymph node enlargement. Cardiac: S1/S2, RRR with premature beats, 1/6 systolic murmurs, No gallops or rubs. Respiratory: No rales, wheezing, rhonchi or rubs (per ED physician, patient had mild wheezing earlier, but no wheezing on my examination currently). GI: Soft, nondistended, nontender, no rebound pain, no organomegaly, BS present. GU: No hematuria Ext: No pitting leg edema bilaterally. 1+DP/PT pulse bilaterally. Musculoskeletal: No joint deformities, No joint redness or warmth, no limitation of ROM in spin. Skin: No rashes.  Neuro: Alert, oriented X3, cranial nerves II-XII grossly intact, moves all extremities normally.  Psych: Patient is not psychotic, no suicidal or hemocidal ideation.  Labs on Admission: I have personally reviewed following labs and imaging studies  CBC: Recent Labs  Lab 08/19/23 1512  WBC 7.9  HGB 15.8  HCT 44.6  MCV 82.3  PLT 183   Basic Metabolic Panel: Recent Labs  Lab 08/19/23 1512  NA 135  K 3.6  CL 100  CO2 26  GLUCOSE 409*  BUN 20  CREATININE 1.26*  CALCIUM 8.6*   GFR: Estimated Creatinine Clearance: 74.5 mL/min (A) (by C-G formula based on SCr of 1.26 mg/dL (H)). Liver Function Tests: No results for input(s): "AST", "ALT", "ALKPHOS", "BILITOT", "PROT", "ALBUMIN" in the last 168 hours. No results for input(s): "LIPASE", "AMYLASE" in the last 168 hours. No results for input(s): "AMMONIA" in the last 168 hours. Coagulation Profile: No results for input(s): "INR", "PROTIME" in the last 168 hours. Cardiac Enzymes: No results for input(s): "CKTOTAL", "CKMB", "CKMBINDEX", "TROPONINI" in the last 168 hours. BNP (last 3 results) No results for input(s): "PROBNP" in the last 8760 hours. HbA1C: No results for input(s): "HGBA1C" in the last 72  hours. CBG: Recent Labs  Lab 08/19/23 1507 08/19/23 2233  GLUCAP 416* 209*   Lipid Profile: No results for input(s): "CHOL", "HDL", "LDLCALC", "TRIG", "CHOLHDL", "LDLDIRECT" in the last 72 hours. Thyroid Function Tests: No results for input(s): "TSH", "T4TOTAL", "FREET4", "T3FREE", "THYROIDAB" in the last 72 hours. Anemia Panel: No results for input(s): "VITAMINB12", "FOLATE", "FERRITIN", "TIBC", "IRON", "RETICCTPCT" in the last 72 hours. Urine analysis:  Component Value Date/Time   COLORURINE YELLOW (A) 03/25/2023 1236   APPEARANCEUR HAZY (A) 03/25/2023 1236   APPEARANCEUR Clear 04/28/2014 2040   LABSPEC 1.027 03/25/2023 1236   LABSPEC 1.030 04/28/2014 2040   PHURINE 5.0 03/25/2023 1236   GLUCOSEU 150 (A) 03/25/2023 1236   GLUCOSEU Negative 04/28/2014 2040   HGBUR NEGATIVE 03/25/2023 1236   BILIRUBINUR NEGATIVE 03/25/2023 1236   BILIRUBINUR Negative 06/27/2022 1527   BILIRUBINUR Negative 04/28/2014 2040   KETONESUR NEGATIVE 03/25/2023 1236   PROTEINUR >=300 (A) 03/25/2023 1236   UROBILINOGEN 0.2 06/27/2022 1527   NITRITE NEGATIVE 03/25/2023 1236   LEUKOCYTESUR NEGATIVE 03/25/2023 1236   LEUKOCYTESUR Negative 04/28/2014 2040   Sepsis Labs: @LABRCNTIP (procalcitonin:4,lacticidven:4) ) Recent Results (from the past 240 hour(s))  Resp panel by RT-PCR (RSV, Flu A&B, Covid) Anterior Nasal Swab     Status: None   Collection Time: 08/19/23  3:12 PM   Specimen: Anterior Nasal Swab  Result Value Ref Range Status   SARS Coronavirus 2 by RT PCR NEGATIVE NEGATIVE Final    Comment: (NOTE) SARS-CoV-2 target nucleic acids are NOT DETECTED.  The SARS-CoV-2 RNA is generally detectable in upper respiratory specimens during the acute phase of infection. The lowest concentration of SARS-CoV-2 viral copies this assay can detect is 138 copies/mL. A negative result does not preclude SARS-Cov-2 infection and should not be used as the sole basis for treatment or other patient management  decisions. A negative result may occur with  improper specimen collection/handling, submission of specimen other than nasopharyngeal swab, presence of viral mutation(s) within the areas targeted by this assay, and inadequate number of viral copies(<138 copies/mL). A negative result must be combined with clinical observations, patient history, and epidemiological information. The expected result is Negative.  Fact Sheet for Patients:  BloggerCourse.com  Fact Sheet for Healthcare Providers:  SeriousBroker.it  This test is no t yet approved or cleared by the [place] FDA and  has been authorized for detection and/or diagnosis of SARS-CoV-2 by FDA under an Emergency Use Authorization (EUA). This EUA will remain  in effect (meaning this test can be used) for the duration of the COVID-19 declaration under Section 564(b)(1) of the Act, 21 U.S.C.section 360bbb-3(b)(1), unless the authorization is terminated  or revoked sooner.       Influenza A by PCR NEGATIVE NEGATIVE Final   Influenza B by PCR NEGATIVE NEGATIVE Final    Comment: (NOTE) The Xpert Xpress SARS-CoV-2/FLU/RSV plus assay is intended as an aid in the diagnosis of influenza from Nasopharyngeal swab specimens and should not be used as a sole basis for treatment. Nasal washings and aspirates are unacceptable for Xpert Xpress SARS-CoV-2/FLU/RSV testing.  Fact Sheet for Patients: BloggerCourse.com  Fact Sheet for Healthcare Providers: SeriousBroker.it  This test is not yet approved or cleared by the [place] FDA and has been authorized for detection and/or diagnosis of SARS-CoV-2 by FDA under an Emergency Use Authorization (EUA). This EUA will remain in effect (meaning this test can be used) for the duration of the COVID-19 declaration under Section 564(b)(1) of the Act, 21 U.S.C. section 360bbb-3(b)(1), unless the  authorization is terminated or revoked.     Resp Syncytial Virus by PCR NEGATIVE NEGATIVE Final    Comment: (NOTE) Fact Sheet for Patients: BloggerCourse.com  Fact Sheet for Healthcare Providers: SeriousBroker.it  This test is not yet approved or cleared by the [place] FDA and has been authorized for detection and/or diagnosis of SARS-CoV-2 by FDA under an Emergency Use Authorization (EUA). This EUA  will remain in effect (meaning this test can be used) for the duration of the COVID-19 declaration under Section 564(b)(1) of the Act, 21 U.S.C. section 360bbb-3(b)(1), unless the authorization is terminated or revoked.  Performed at [facility], [address], [place], [place] [address]      Radiological Exams on Admission: DG Chest 2 View  Result Date: 08/19/2023 CLINICAL DATA:  Chest pain.  Shortness of breath. EXAM: CHEST - 2 VIEW COMPARISON:  05/27/2023. FINDINGS: Bilateral lung fields are clear. Bilateral costophrenic angles are clear. Stable cardio-mediastinal silhouette. No acute osseous abnormalities. Cervicothoracic spinal fixation hardware is again seen. The soft tissues are within normal limits. IMPRESSION: *No active cardiopulmonary disease. Electronically Signed   By: Jules Schick M.D.   On: 08/19/2023 16:53      Assessment/Plan Principal Problem:   Hypertensive emergency Active Problems:   Myocardial injury   CAD (coronary artery disease)   COPD with asthma (HCC)   Chronic diastolic CHF (congestive heart failure) (HCC)   Type II diabetes mellitus with renal manifestations (HCC)   HLD (hyperlipidemia)   CKD (chronic kidney disease) stage 2, GFR 60-89 ml/min   Gout   Diarrhea   Tobacco use   Obesity (BMI 30-39.9)   Assessment and Plan:  Hypertensive emergency: Bp 195/95 --> 151/97. Has chest pain/pressure and elevated troponin.  -Placed on telemetry bed for observation IV hydralazine as  needed- -continue home Coreg, lisinopril -Patient is on torsemide and spironolactone for CHF  Myocardial injury and history of CAD: Troponin 23 --> 22.  Patient had cardiac cath on 8/14 which showed nonobstructive CAD.  His chest pain is likely due to hypertensive emergency, but the patient reports that his chest pain is aggravated by deep breath.  Will need to rule out PE.  Patient had A1c 9.8 on 08/09/2023.  Patient had lipid panel on 08/09/2023, but no LDL data. -As needed fentanyl for chest pain -Patient is allergic to nitroglycerin -Aspirin 324 mg x 1, then 81 mg daily -Crestor -Check LDL -trend trop -Check D-dimer.  If D-dimer is positive, then will get CTA to r/o PE  COPD with asthma [facility]): ED physician, patient had mild wheezing earlier, which has resolved.  On my examination, patient does not have wheezing or rhonchi.  Does not seem to have COPD exacerbation. -Bronchodilators, as needed Mucinex  Chronic diastolic CHF (congestive heart failure) (HCC): 2D echo 05/28/2023 showed EF 50-55% with grade 3 diastolic dysfunction.  Patient does not have leg edema.  Difficult to assess JVD due to obesity.  His baseline was BNP 53 recently, today his BNP is elevated to 143.9. -Will give 1 dose of Lasix 40 mg by IV -Continue torsemide and spironolactone  Type II diabetes mellitus with renal manifestations [facility]): Recent A1c 9.8, poorly controlled.  Blood sugar 409 with normal anion gap.  Patient said taking Trulicity, Humalog, metformin, Lantus 20 units daily -Sliding scale insulin -Glargine insulin 20 units daily  HLD (hyperlipidemia) -Crestor, Zetia  CKD (chronic kidney disease) stage 2, GFR 60-89 ml/min: Renal function stable.  Baseline creatinine 1.42 on 08/09/2023.  His creatinine is 1.26, BUN 20, GFR> 60% today -Followed by [facility]  History of gout: -Continue allopurinol  Diarrhea: Patient reports 1 episode of watery bowel movement today.  No nausea vomiting or abdominal pain.  Low  suspicions of C. Difficile -Observe closely.  If patient continues to have diarrhea, will need to check C. difficile  Tobacco use -Nicotine patch -Did counseling about importance of quitting tobacco use  Obesity (BMI 30-39.9): Body weight 103.7 kg, BMI 36.35 -Encouraged losing weight -Exercise and healthy diet      DVT ppx: SQ Lovenox  Code Status: Full code   Family Communication: not done, no family member is at bed side.     Disposition Plan:  Anticipate discharge back to previous environment  Consults called:  none   Admission status and Level of care: Telemetry Cardiac:     for obs    Dispo: The patient is from: Home              Anticipated d/c is to: Home              Anticipated d/c date is: 1 day              Patient currently is not medically stable to d/c.    Severity of Illness:  The appropriate patient status for this patient is OBSERVATION. Observation status is judged to be reasonable and necessary in order to provide the required intensity of service to ensure the patient's safety. The patient's presenting symptoms, physical exam findings, and initial radiographic and laboratory data in the context of their medical condition is felt to place them at decreased risk for further clinical deterioration. Furthermore, it is anticipated that the patient will be medically stable for discharge from the hospital within 2 midnights of admission.        Date of Service 08/19/2023    Lorretta Harp [facility]   If 7PM-7AM, please contact night-coverage [email or web address] 08/19/2023, 10:35 PM

## 2023-08-19 NOTE — ED Triage Notes (Signed)
Pt here with cp and SOB since Sat. Pt states pain is centered and does not radiate. Pt states pain is intermittent. Pt denies NVD.

## 2023-08-19 NOTE — Progress Notes (Signed)
Anticoagulation monitoring(Lovenox):  57 yo male ordered Lovenox 40 mg Q24h    Filed Weights   08/19/23 1504  Weight: 103.7 kg (228 lb 9.9 oz)   BMI 36.4   Lab Results  Component Value Date   CREATININE 1.26 (H) 08/19/2023   CREATININE 1.42 (H) 08/09/2023   CREATININE 1.24 07/09/2023   Estimated Creatinine Clearance: 74.5 mL/min (A) (by C-G formula based on SCr of 1.26 mg/dL (H)). Hemoglobin & Hematocrit     Component Value Date/Time   HGB 15.8 08/19/2023 1512   HGB 14.1 08/01/2022 1057   HCT 44.6 08/19/2023 1512   HCT 42.1 08/01/2022 1057     Per Protocol for Patient with estCrcl > 30 ml/min and BMI > 30, will transition to Lovenox 52.5 mg Q24h.

## 2023-08-19 NOTE — Progress Notes (Signed)
1. No-show for appointment     

## 2023-08-19 NOTE — ED Provider Notes (Signed)
[facility] Provider Note    Event Date/Time   First MD Initiated Contact with Patient 08/19/23 2142     (approximate)   History   Chest Pain and Shortness of Breath   HPI Cory Rivas is a 57 y.o. male with history of HTN, diastolic heart failure, HLD, DM2, CKD stage III presenting today for chest pain.  Patient states that pain symptoms intermittently started 2 days ago but have become more constant over the past 12 hours.  He has some radiation to his left arm.  Notes intermittent shortness of breath.  Intermittent diaphoresis.  Otherwise denies nausea, vomiting, abdominal pain, leg pain, leg swelling.  Chart review: Patient had admission for hypertensive urgency back in August of this year.  Negative heart cath at that time.  Symptoms resolved with improvement in blood pressure.     Physical Exam   Triage Vital Signs: ED Triage Vitals  Encounter Vitals Group     BP 08/19/23 1506 (!) 195/98     Systolic BP Percentile --      Diastolic BP Percentile --      Pulse Rate 08/19/23 1504 77     Resp 08/19/23 1504 18     Temp 08/19/23 1506 98.1 F (36.7 C)     Temp Source 08/19/23 1506 Oral     SpO2 08/19/23 1504 96 %     Weight 08/19/23 1504 228 lb 9.9 oz (103.7 kg)     Height 08/19/23 1504 5' 6.5" (1.689 m)     Head Circumference --      Peak Flow --      Pain Score 08/19/23 1504 4     Pain Loc --      Pain Education --      Exclude from Growth Chart --     Most recent vital signs: Vitals:   08/19/23 1506 08/19/23 2206  BP: (!) 195/98 (!) 151/97  Pulse:  61  Resp:  18  Temp: 98.1 F (36.7 C) 97.9 F (36.6 C)  SpO2:  96%   Physical Exam: I have reviewed the vital signs and nursing notes. General: Awake, alert, no acute distress.  Nontoxic appearing. Head:  Atraumatic, normocephalic.   ENT:  EOM intact, PERRL. Oral mucosa is pink and moist with no lesions. Neck: Neck is supple with full range of motion, No meningeal  signs. Cardiovascular:  RRR, No murmurs. Peripheral pulses palpable and equal bilaterally. Respiratory:  Symmetrical chest wall expansion.  No rhonchi, rales, or wheezes.  Good air movement throughout.  No use of accessory muscles.   Musculoskeletal:  No cyanosis or edema. Moving extremities with full ROM Abdomen:  Soft, nontender, nondistended. Neuro:  GCS 15, moving all four extremities, interacting appropriately. Speech clear. Psych:  Calm, appropriate.   Skin:  Warm, dry, no rash.    ED Results / Procedures / Treatments   Labs (all labs ordered are listed, but only abnormal results are displayed) Labs Reviewed  BASIC METABOLIC PANEL - Abnormal; Notable for the following components:      Result Value   Glucose, Bld 409 (*)    Creatinine, Ser 1.26 (*)    Calcium 8.6 (*)    All other components within normal limits  BRAIN NATRIURETIC PEPTIDE - Abnormal; Notable for the following components:   B Natriuretic Peptide 143.9 (*)    All other components within normal limits  CBG MONITORING, ED - Abnormal; Notable for the following components:   Glucose-Capillary 416 (*)  All other components within normal limits  CBG MONITORING, ED - Abnormal; Notable for the following components:   Glucose-Capillary 209 (*)    All other components within normal limits  TROPONIN I (HIGH SENSITIVITY) - Abnormal; Notable for the following components:   Troponin I (High Sensitivity) 23 (*)    All other components within normal limits  TROPONIN I (HIGH SENSITIVITY) - Abnormal; Notable for the following components:   Troponin I (High Sensitivity) 22 (*)    All other components within normal limits  RESP PANEL BY RT-PCR (RSV, FLU A&B, COVID)  [id number]  CBC  BETA-HYDROXYBUTYRIC ACID  URINE DRUG SCREEN, QUALITATIVE ([facility] ONLY)  D-DIMER, QUANTITATIVE  PROTIME-INR  APTT  BASIC METABOLIC PANEL  CBC  LDL CHOLESTEROL, DIRECT     EKG My EKG interpretation: Rate of 83, sinus rhythm with occasional PVC.   Normal axis normal intervals.  No acute ST elevations or depressions.  T wave inversions present in lead II, 3, aVF, V4, V5, V6.  Largely consistent with most recent EKG on 07/09/2023.   RADIOLOGY Independently interpreted chest x-ray with no acute pathology.   PROCEDURES:  Critical Care performed: No  Procedures   MEDICATIONS ORDERED IN ED: Medications  nicotine (NICODERM CQ - dosed in mg/24 hours) patch 21 mg (21 mg Transdermal Patch Applied 08/19/23 2210)  hydrALAZINE (APRESOLINE) injection 10 mg (has no administration in time range)  ondansetron (ZOFRAN) injection 4 mg (has no administration in time range)  acetaminophen (TYLENOL) tablet 650 mg (has no administration in time range)  ipratropium-albuterol (DUONEB) 0.5-2.5 (3) MG/3ML nebulizer solution 3 mL (3 mLs Nebulization Given 08/19/23 2210)  albuterol (PROVENTIL) (2.5 MG/3ML) 0.083% nebulizer solution 2.5 mg (has no administration in time range)  dextromethorphan-guaiFENesin (MUCINEX DM) 30-600 MG per 12 hr tablet 1 tablet (has no administration in time range)  fentaNYL (SUBLIMAZE) injection 25 mcg (has no administration in time range)  aspirin EC tablet 81 mg (has no administration in time range)  insulin aspart (novoLOG) injection 0-5 Units ( Subcutaneous Not Given 08/19/23 2230)  insulin aspart (novoLOG) injection 0-15 Units (has no administration in time range)  insulin glargine-yfgn (SEMGLEE) injection 20 Units (has no administration in time range)  enoxaparin (LOVENOX) injection 52.5 mg (52.5 mg Subcutaneous Given 08/19/23 2234)  furosemide (LASIX) injection 40 mg (has no administration in time range)  insulin aspart (novoLOG) injection 10 Units (10 Units Subcutaneous Given 08/19/23 2212)  aspirin chewable tablet 324 mg (324 mg Oral Given 08/19/23 2210)     IMPRESSION / MDM / ASSESSMENT AND PLAN / ED COURSE  I reviewed the triage vital signs and the nursing notes.                              Differential diagnosis  includes, but is not limited to, hypertensive urgency, acute on chronic heart failure exacerbation, COPD exacerbation, ACS, pneumonia  Patient's presentation is most consistent with acute complicated illness / injury requiring diagnostic workup.  Patient is a 57 year old male presenting today for chest pressure associated with shortness of breath.  Exam largely unremarkable other than some faint expiratory wheezing likely consistent with patient's known history of COPD.  EKG consistent with baseline.  Troponins found to be elevated at 21 and 22.  Patient notably hypertensive on arrival with systolic pressure at 195.  More concerning for possible hypertensive urgency given recent admission for similar with negative left heart cath.  Laboratory workup elsewise reassuring.  While BNP  is slightly elevated, no lower extremity edema or pulmonary edema seen on chest x-ray.  Given ongoing chest pressure, plan to admit to hospitalist for hypertensive urgency.  Patient given DuoNeb and labetalol to treat symptoms at this time.  Hospitalist is agreed to admit patient for further care.  The patient is on the cardiac monitor to evaluate for evidence of arrhythmia and/or significant heart rate changes.     FINAL CLINICAL IMPRESSION(S) / ED DIAGNOSES   Final diagnoses:  Hypertensive urgency     Rx / DC Orders   ED Discharge Orders     None        Note:  This document was prepared using Dragon voice recognition software and may include unintentional dictation errors.   Janith Lima, MD 08/19/23 2248

## 2023-08-20 ENCOUNTER — Encounter: Payer: Self-pay | Admitting: Internal Medicine

## 2023-08-20 DIAGNOSIS — I161 Hypertensive emergency: Secondary | ICD-10-CM

## 2023-08-20 LAB — BASIC METABOLIC PANEL
Anion gap: 10 (ref 5–15)
BUN: 24 mg/dL — ABNORMAL HIGH (ref 6–20)
CO2: 25 mmol/L (ref 22–32)
Calcium: 8.8 mg/dL — ABNORMAL LOW (ref 8.9–10.3)
Chloride: 101 mmol/L (ref 98–111)
Creatinine, Ser: 1.34 mg/dL — ABNORMAL HIGH (ref 0.61–1.24)
GFR, Estimated: >60 mL/min (ref >60)
Glucose, Bld: 191 mg/dL — ABNORMAL HIGH (ref 70–99)
Potassium: 3.6 mmol/L (ref 3.5–5.1)
Sodium: 136 mmol/L (ref 135–145)

## 2023-08-20 LAB — URINE DRUG SCREEN, QUALITATIVE (ARMC ONLY)
Amphetamines, Ur Screen: NOT DETECTED
Barbiturates, Ur Screen: NOT DETECTED
Benzodiazepine, Ur Scrn: NOT DETECTED
Cannabinoid 50 Ng, Ur ~~LOC~~: NOT DETECTED
Cocaine Metabolite,Ur ~~LOC~~: NOT DETECTED
MDMA (Ecstasy)Ur Screen: NOT DETECTED
Methadone Scn, Ur: NOT DETECTED
Opiate, Ur Screen: NOT DETECTED
Phencyclidine (PCP) Ur S: NOT DETECTED
Tricyclic, Ur Screen: NOT DETECTED

## 2023-08-20 LAB — CBC
HCT: 45 % (ref 39.0–52.0)
Hemoglobin: 15.7 g/dL (ref 13.0–17.0)
MCH: 29 pg (ref 26.0–34.0)
MCHC: 34.9 g/dL (ref 30.0–36.0)
MCV: 83.2 fL (ref 80.0–100.0)
Platelets: 203 10*3/uL (ref 150–400)
RBC: 5.41 MIL/uL (ref 4.22–5.81)
RDW: 13.4 % (ref 11.5–15.5)
WBC: 10 10*3/uL (ref 4.0–10.5)
nRBC: 0 % (ref 0.0–0.2)

## 2023-08-20 LAB — GLUCOSE, CAPILLARY: Glucose-Capillary: 213 mg/dL — ABNORMAL HIGH (ref 70–99)

## 2023-08-20 LAB — LDL CHOLESTEROL, DIRECT: Direct LDL: 147 mg/dL — ABNORMAL HIGH (ref 0–99)

## 2023-08-20 MED ORDER — CARVEDILOL 25 MG PO TABS
25.0000 mg | ORAL_TABLET | Freq: Two times a day (BID) | ORAL | Status: DC
  Administered 2023-08-20: 25 mg via ORAL
  Filled 2023-08-20: qty 1

## 2023-08-20 MED ORDER — TORSEMIDE 20 MG PO TABS
40.0000 mg | ORAL_TABLET | Freq: Every morning | ORAL | Status: DC
  Administered 2023-08-20: 40 mg via ORAL
  Filled 2023-08-20: qty 2

## 2023-08-20 MED ORDER — INSULIN ASPART 100 UNIT/ML IJ SOLN: 4.0000 [IU] | Freq: Three times a day (TID) | INTRAMUSCULAR | Status: DC

## 2023-08-20 MED ORDER — TIOTROPIUM BROMIDE MONOHYDRATE 18 MCG IN CAPS
18.0000 ug | ORAL_CAPSULE | Freq: Every day | RESPIRATORY_TRACT | Status: DC
  Administered 2023-08-20: 18 ug via RESPIRATORY_TRACT
  Filled 2023-08-20: qty 5

## 2023-08-20 MED ORDER — DULOXETINE HCL 30 MG PO CPEP
60.0000 mg | ORAL_CAPSULE | Freq: Every morning | ORAL | Status: DC
  Administered 2023-08-20: 60 mg via ORAL
  Filled 2023-08-20: qty 2

## 2023-08-20 MED ORDER — ROSUVASTATIN CALCIUM 10 MG PO TABS: 40.0000 mg | ORAL_TABLET | Freq: Every day | ORAL | Status: DC

## 2023-08-20 MED ORDER — ALLOPURINOL 100 MG PO TABS
100.0000 mg | ORAL_TABLET | Freq: Every day | ORAL | Status: DC
  Administered 2023-08-20: 100 mg via ORAL
  Filled 2023-08-20: qty 1

## 2023-08-20 MED ORDER — PREGABALIN 50 MG PO CAPS
50.0000 mg | ORAL_CAPSULE | Freq: Two times a day (BID) | ORAL | Status: DC
  Administered 2023-08-20: 50 mg via ORAL
  Filled 2023-08-20: qty 1

## 2023-08-20 MED ORDER — DULOXETINE HCL 30 MG PO CPEP: 30.0000 mg | ORAL_CAPSULE | Freq: Every day | ORAL | Status: DC

## 2023-08-20 MED ORDER — ASPIRIN 81 MG PO TBEC: 81.0000 mg | DELAYED_RELEASE_TABLET | Freq: Every day | ORAL | 0 refills | Status: DC

## 2023-08-20 MED ORDER — AMLODIPINE BESYLATE 5 MG PO TABS: 5.0000 mg | ORAL_TABLET | Freq: Every day | ORAL | 0 refills | Status: DC

## 2023-08-20 MED ORDER — EZETIMIBE 10 MG PO TABS
10.0000 mg | ORAL_TABLET | Freq: Every day | ORAL | Status: DC
  Administered 2023-08-20: 10 mg via ORAL
  Filled 2023-08-20: qty 1

## 2023-08-20 MED ORDER — SPIRONOLACTONE 25 MG PO TABS
25.0000 mg | ORAL_TABLET | Freq: Every day | ORAL | Status: DC
  Administered 2023-08-20: 25 mg via ORAL
  Filled 2023-08-20: qty 1

## 2023-08-20 MED ORDER — NITROGLYCERIN 0.4 MG SL SUBL: 0.4000 mg | SUBLINGUAL_TABLET | SUBLINGUAL | Status: DC | PRN

## 2023-08-20 MED ORDER — LISINOPRIL 20 MG PO TABS
40.0000 mg | ORAL_TABLET | Freq: Every day | ORAL | Status: DC
  Administered 2023-08-20: 40 mg via ORAL
  Filled 2023-08-20: qty 2

## 2023-08-20 MED ORDER — PANTOPRAZOLE SODIUM 40 MG PO TBEC
40.0000 mg | DELAYED_RELEASE_TABLET | Freq: Every day | ORAL | Status: DC
  Administered 2023-08-20: 40 mg via ORAL
  Filled 2023-08-20: qty 1

## 2023-08-20 NOTE — Plan of Care (Signed)

## 2023-08-20 NOTE — TOC CM/SW Note (Signed)
Transition of Care [facility]) - Inpatient Brief Assessment   Patient Details  Name: Cory Rivas MRN: [id number] Date of Birth: 04-02-1966  [facility] CM/SW Contact:    Margarito Liner, LCSW Phone Number: 08/20/2023, 10:54 AM   Clinical Narrative: Patient has orders to discharge home today. CSW reviewed chart. No TOC needs identified. CSW signing off.  Transition of Care Asessment: Insurance and Status: Insurance coverage has been reviewed Patient has primary care physician: Yes Home environment has been reviewed: Single family home Prior level of function:: Not documented Prior/Current Home Services: No current home services Social Determinants of Health Reivew: SDOH reviewed no interventions necessary Readmission risk has been reviewed: Yes Transition of care needs: no transition of care needs at this time

## 2023-08-20 NOTE — ED Notes (Signed)
ED TO INPATIENT HANDOFF REPORT  ED Nurse Name and Phone #:[phone] [facility]   S Name/Age/Gender Cory Rivas 57 y.o. male Room/Bed: ED18HA/ED18HA  Code Status   Code Status: Full Code  Home/SNF/Other Home Patient oriented to: self, place, time, and situation Is this baseline? Yes   Triage Complete: Triage complete  Chief Complaint Hypertensive urgency [I16.0] Hypertensive emergency [I16.1]  Triage Note Pt here with cp and SOB since Sat. Pt states pain is centered and does not radiate. Pt states pain is intermittent. Pt denies NVD.   Allergies Allergies  Allergen Reactions   Oxycodone Nausea And Vomiting   Entresto [Sacubitril-Valsartan] Nausea Only and Other (See Comments)    Lightheaded and dizzy    Onion Nausea And Vomiting   Nitroglycerin Itching and Rash    Level of Care/Admitting Diagnosis ED Disposition     ED Disposition  Admit   Condition  --   Comment  Hospital Area: [facility] [[id number]]  Level of Care: Telemetry Cardiac [[id number]]  Covid Evaluation: Confirmed COVID Negative  Diagnosis: Hypertensive emergency [478295]  Admitting Physician: Lorretta Harp [[id number]]  Attending Physician: Lorretta Harp [[id number]]          B Medical/Surgery History Past Medical History:  Diagnosis Date   Bulging of cervical intervertebral disc    CHF (congestive heart failure) (HCC)    Diabetes mellitus without complication (HCC)    Gout    High cholesterol    Hypertension    Stress due to family tension 04/15/2019   Past Surgical History:  Procedure Laterality Date   CHOLECYSTECTOMY     COLONOSCOPY WITH PROPOFOL N/A 03/10/2020   Procedure: COLONOSCOPY WITH PROPOFOL;  Surgeon: Midge Minium, MD;  Location: [facility] ENDOSCOPY;  Service: Endoscopy;  Laterality: N/A;   CORONARY PRESSURE/FFR STUDY N/A 05/29/2023   Procedure: CORONARY PRESSURE/FFR STUDY;  Surgeon: Yvonne Kendall, MD;  Location: [facility] INVASIVE CV LAB;  Service: Cardiovascular;  Laterality: N/A;    RIGHT/LEFT HEART CATH AND CORONARY ANGIOGRAPHY N/A 05/29/2023   Procedure: RIGHT/LEFT HEART CATH AND CORONARY ANGIOGRAPHY;  Surgeon: Yvonne Kendall, MD;  Location: [facility] INVASIVE CV LAB;  Service: Cardiovascular;  Laterality: N/A;     A IV Location/Drains/Wounds Patient Lines/Drains/Airways Status     Active Line/Drains/Airways     Name Placement date Placement time Site Days   Peripheral IV 08/19/23 20 G 1" Anterior;Proximal;Right Forearm 08/19/23  1511  Forearm  1            Intake/Output Last 24 hours No intake or output data in the 24 hours ending 08/20/23 0013  Labs/Imaging Results for orders placed or performed during the hospital encounter of 08/19/23 (from the past 48 hour(s))  CBG monitoring, ED     Status: Abnormal   Collection Time: 08/19/23  3:07 PM  Result Value Ref Range   Glucose-Capillary 416 (H) 70 - 99 mg/dL    Comment: Glucose reference range applies only to samples taken after fasting for at least 8 hours.  Brain natriuretic peptide     Status: Abnormal   Collection Time: 08/19/23  3:11 PM  Result Value Ref Range   B Natriuretic Peptide 143.9 (H) 0.0 - 100.0 pg/mL    Comment: Performed at [facility], [address] [place], [place] [address]  Basic metabolic panel     Status: Abnormal   Collection Time: 08/19/23  3:12 PM  Result Value Ref Range   Sodium 135 135 - 145 mmol/L   Potassium 3.6 3.5 - 5.1 mmol/L  Chloride 100 98 - 111 mmol/L   CO2 26 22 - 32 mmol/L   Glucose, Bld 409 (H) 70 - 99 mg/dL    Comment: Glucose reference range applies only to samples taken after fasting for at least 8 hours.   BUN 20 6 - 20 mg/dL   Creatinine, Ser 9.56 (H) 0.61 - 1.24 mg/dL   Calcium 8.6 (L) 8.9 - 10.3 mg/dL   GFR, Estimated >21 >30 mL/min    Comment: (NOTE) Calculated using the CKD-EPI Creatinine Equation (2021)    Anion gap 9 5 - 15    Comment: Performed at [facility], [address] [place], [place] [address]  CBC     Status:  None   Collection Time: 08/19/23  3:12 PM  Result Value Ref Range   WBC 7.9 4.0 - 10.5 K/uL   RBC 5.42 4.22 - 5.81 MIL/uL   Hemoglobin 15.8 13.0 - 17.0 g/dL   HCT 46.9 62.9 - 52.8 %   MCV 82.3 80.0 - 100.0 fL   MCH 29.2 26.0 - 34.0 pg   MCHC 35.4 30.0 - 36.0 g/dL   RDW 41.3 24.4 - 01.0 %   Platelets 183 150 - 400 K/uL   nRBC 0.0 0.0 - 0.2 %    Comment: Performed at [facility], [address] [place], [place] [address]  Troponin I (High Sensitivity)     Status: Abnormal   Collection Time: 08/19/23  3:12 PM  Result Value Ref Range   Troponin I (High Sensitivity) 23 (H) <18 ng/L    Comment: (NOTE) Elevated high sensitivity troponin I (hsTnI) values and significant  changes across serial measurements may suggest ACS but many other  chronic and acute conditions are known to elevate hsTnI results.  Refer to the "Links" section for chest pain algorithms and additional  guidance. Performed at [facility], [address] [place], [place] [address]   Resp panel by RT-PCR (RSV, Flu A&B, Covid) Anterior Nasal Swab     Status: None   Collection Time: 08/19/23  3:12 PM   Specimen: Anterior Nasal Swab  Result Value Ref Range   SARS Coronavirus 2 by RT PCR NEGATIVE NEGATIVE    Comment: (NOTE) SARS-CoV-2 target nucleic acids are NOT DETECTED.  The SARS-CoV-2 RNA is generally detectable in upper respiratory specimens during the acute phase of infection. The lowest concentration of SARS-CoV-2 viral copies this assay can detect is 138 copies/mL. A negative result does not preclude SARS-Cov-2 infection and should not be used as the sole basis for treatment or other patient management decisions. A negative result may occur with  improper specimen collection/handling, submission of specimen other than nasopharyngeal swab, presence of viral mutation(s) within the areas targeted by this assay, and inadequate number of viral copies(<138 copies/mL). A negative result must be  combined with clinical observations, patient history, and epidemiological information. The expected result is Negative.  Fact Sheet for Patients:  BloggerCourse.com  Fact Sheet for Healthcare Providers:  SeriousBroker.it  This test is no t yet approved or cleared by the [place] FDA and  has been authorized for detection and/or diagnosis of SARS-CoV-2 by FDA under an Emergency Use Authorization (EUA). This EUA will remain  in effect (meaning this test can be used) for the duration of the COVID-19 declaration under Section 564(b)(1) of the Act, 21 U.S.C.section 360bbb-3(b)(1), unless the authorization is terminated  or revoked sooner.       Influenza A by PCR NEGATIVE NEGATIVE   Influenza  B by PCR NEGATIVE NEGATIVE    Comment: (NOTE) The Xpert Xpress SARS-CoV-2/FLU/RSV plus assay is intended as an aid in the diagnosis of influenza from Nasopharyngeal swab specimens and should not be used as a sole basis for treatment. Nasal washings and aspirates are unacceptable for Xpert Xpress SARS-CoV-2/FLU/RSV testing.  Fact Sheet for Patients: BloggerCourse.com  Fact Sheet for Healthcare Providers: SeriousBroker.it  This test is not yet approved or cleared by the [place] FDA and has been authorized for detection and/or diagnosis of SARS-CoV-2 by FDA under an Emergency Use Authorization (EUA). This EUA will remain in effect (meaning this test can be used) for the duration of the COVID-19 declaration under Section 564(b)(1) of the Act, 21 U.S.C. section 360bbb-3(b)(1), unless the authorization is terminated or revoked.     Resp Syncytial Virus by PCR NEGATIVE NEGATIVE    Comment: (NOTE) Fact Sheet for Patients: BloggerCourse.com  Fact Sheet for Healthcare Providers: SeriousBroker.it  This test is not yet approved or cleared  by the [place] FDA and has been authorized for detection and/or diagnosis of SARS-CoV-2 by FDA under an Emergency Use Authorization (EUA). This EUA will remain in effect (meaning this test can be used) for the duration of the COVID-19 declaration under Section 564(b)(1) of the Act, 21 U.S.C. section 360bbb-3(b)(1), unless the authorization is terminated or revoked.  Performed at [facility], [address] [place], [place] [address]   Beta-hydroxybutyric acid     Status: None   Collection Time: 08/19/23  3:12 PM  Result Value Ref Range   Beta-Hydroxybutyric Acid 0.08 0.05 - 0.27 mmol/L    Comment: Performed at [facility], [address] [place], [place] [address]  Troponin I (High Sensitivity)     Status: Abnormal   Collection Time: 08/19/23  5:17 PM  Result Value Ref Range   Troponin I (High Sensitivity) 22 (H) <18 ng/L    Comment: (NOTE) Elevated high sensitivity troponin I (hsTnI) values and significant  changes across serial measurements may suggest ACS but many other  chronic and acute conditions are known to elevate hsTnI results.  Refer to the "Links" section for chest pain algorithms and additional  guidance. Performed at [facility], [address] [place], [place] [address]   D-dimer, quantitative     Status: None   Collection Time: 08/19/23 10:22 PM  Result Value Ref Range   D-Dimer, Quant 0.30 0.00 - 0.50 ug/mL-FEU    Comment: (NOTE) At the manufacturer cut-off value of 0.5 g/mL FEU, this assay has a negative predictive value of 95-100%.This assay is intended for use in conjunction with a clinical pretest probability (PTP) assessment model to exclude pulmonary embolism (PE) and deep venous thrombosis (DVT) in outpatients suspected of PE or DVT. Results should be correlated with clinical presentation. Performed at [facility], [address] [place], [place] [address]   Protime-INR     Status: None    Collection Time: 08/19/23 10:22 PM  Result Value Ref Range   Prothrombin Time 14.2 11.4 - 15.2 seconds   INR 1.1 0.8 - 1.2    Comment: (NOTE) INR goal varies based on device and disease states. Performed at [facility], [address] [place], [place] [address]   APTT     Status: None   Collection Time: 08/19/23 10:22 PM  Result Value Ref Range   aPTT 28 24 - 36 seconds    Comment: Performed at [facility], [address] [place], [place]  [address]  Troponin I (High Sensitivity)     Status: Abnormal   Collection Time: 08/19/23 10:22 PM  Result Value Ref Range   Troponin I (High Sensitivity) 22 (H) <18 ng/L    Comment: (NOTE) Elevated high sensitivity troponin I (hsTnI) values and significant  changes across serial measurements may suggest ACS but many other  chronic and acute conditions are known to elevate hsTnI results.  Refer to the "Links" section for chest pain algorithms and additional  guidance. Performed at [facility], [address] [place], [place] [address]   CBG monitoring, ED     Status: Abnormal   Collection Time: 08/19/23 10:33 PM  Result Value Ref Range   Glucose-Capillary 209 (H) 70 - 99 mg/dL    Comment: Glucose reference range applies only to samples taken after fasting for at least 8 hours.   DG Chest 2 View  Result Date: 08/19/2023 CLINICAL DATA:  Chest pain.  Shortness of breath. EXAM: CHEST - 2 VIEW COMPARISON:  05/27/2023. FINDINGS: Bilateral lung fields are clear. Bilateral costophrenic angles are clear. Stable cardio-mediastinal silhouette. No acute osseous abnormalities. Cervicothoracic spinal fixation hardware is again seen. The soft tissues are within normal limits. IMPRESSION: *No active cardiopulmonary disease. Electronically Signed   By: Jules Schick M.D.   On: 08/19/2023 16:53    Pending Labs Unresulted Labs (From admission, onward)     Start     Ordered   08/20/23 0500  Basic metabolic panel  Tomorrow  morning,   R        08/19/23 2219   08/20/23 0500  CBC  Tomorrow morning,   R        08/19/23 2219   08/19/23 2239  LDL cholesterol, direct  Add-on,   AD        08/19/23 2238   08/19/23 2156  Urine Drug Screen, Qualitative ([facility] only)  Once,   R        08/19/23 2155            Vitals/Pain Today's Vitals   08/19/23 1506 08/19/23 2206 08/19/23 2217 08/19/23 2344  BP: (!) 195/98 (!) 151/97  (!) 147/78  Pulse:  61  (!) 57  Resp:  18  (!) 23  Temp: 98.1 F (36.7 C) 97.9 F (36.6 C)    TempSrc: Oral Oral    SpO2:  96%  93%  Weight:      Height:      PainSc:   4      Isolation Precautions No active isolations  Medications Medications  nicotine (NICODERM CQ - dosed in mg/24 hours) patch 21 mg (21 mg Transdermal Patch Applied 08/19/23 2210)  hydrALAZINE (APRESOLINE) injection 10 mg (has no administration in time range)  ondansetron (ZOFRAN) injection 4 mg (has no administration in time range)  acetaminophen (TYLENOL) tablet 650 mg (has no administration in time range)  ipratropium-albuterol (DUONEB) 0.5-2.5 (3) MG/3ML nebulizer solution 3 mL (3 mLs Nebulization Given 08/19/23 2210)  albuterol (PROVENTIL) (2.5 MG/3ML) 0.083% nebulizer solution 2.5 mg (has no administration in time range)  dextromethorphan-guaiFENesin (MUCINEX DM) 30-600 MG per 12 hr tablet 1 tablet (has no administration in time range)  fentaNYL (SUBLIMAZE) injection 25 mcg (has no administration in time range)  aspirin EC tablet 81 mg (has no administration in time range)  insulin aspart (novoLOG) injection 0-5 Units ( Subcutaneous Not Given 08/19/23 2230)  insulin aspart (novoLOG) injection 0-15 Units (has no administration in time range)  insulin glargine-yfgn (SEMGLEE) injection 20 Units (  20 Units Subcutaneous Given 08/19/23 2253)  enoxaparin (LOVENOX) injection 52.5 mg (52.5 mg Subcutaneous Given 08/19/23 2234)  insulin aspart (novoLOG) injection 10 Units (10 Units Subcutaneous Given 08/19/23 2212)  aspirin  chewable tablet 324 mg (324 mg Oral Given 08/19/23 2210)  furosemide (LASIX) injection 40 mg (40 mg Intravenous Given 08/19/23 2253)    Mobility walks     Focused Assessments Cardiac Assessment Handoff:    Lab Results  Component Value Date   CKTOTAL 155 07/09/2023   Lab Results  Component Value Date   DDIMER 0.30 08/19/2023   Does the Patient currently have chest pain? Yes    R Recommendations: See Admitting Provider Note  Report given to:   Additional Notes: A&O x4, pt is ambulatory to bathroom, NAD, BP has improved, 2/10 CP at current, lung field clear/diminished bilaterally. No family with pt, pt is pleasant.

## 2023-08-20 NOTE — ED Notes (Signed)
Receiving RN Micah Flesher has agreed to accept TOC once pt has arrived to inpatient unit, all questions and concerns address. RN Amy to transport pt with cardiac monitoring to 239A

## 2023-08-20 NOTE — Discharge Summary (Signed)
Physician Discharge Summary   Patient: Cory Rivas MRN: [id number]  DOB: March 22, 1966   Admit:     Date of Admission: 08/19/2023 Admitted from: HOME   Discharge: Date of discharge: 08/20/23 Disposition: Home Condition at discharge: good  CODE STATUS: FULL CODE     Discharge Physician: Sunnie Nielsen, DO [facility]     PCP: Danelle Berry, PA-C  Recommendations for Outpatient Follow-up:  Follow up with PCP Danelle Berry, PA-C in 1-2 weeks    Discharge Instructions     Diet - low sodium heart healthy   Complete by: As directed    Increase activity slowly   Complete by: As directed          Discharge Diagnoses: Principal Problem:   Hypertensive emergency Active Problems:   Myocardial injury   CAD (coronary artery disease)   COPD with asthma (HCC)   Chronic diastolic CHF (congestive heart failure) (HCC)   Type II diabetes mellitus with renal manifestations (HCC)   HLD (hyperlipidemia)   CKD (chronic kidney disease) stage 2, GFR 60-89 ml/min   Gout   Diarrhea   Tobacco use   Obesity (BMI 30-39.9)       HPI: Cory Rivas is a 57 y.o. male with medical history significant of HTN, HLD, DM, CAD, COPD/asthma, dCHF, gout, CKD-2, tobacco abuse, depression, who presents with chest pain/pressure x2 days, located in the front chest, pressure-like, mild to moderate, nonradiating, aggravated with deep breath. Patient has shortness breath and cough with little yellow sputum production.  Of note,  patient had admission for hypertensive urgency in August 2024 about 3 mos ago. He had cardiac cath on 05/29/23 which showed nonobstructive CAD.   Hospital course / significant events:  11/04: to ED, Bp 195/95 --> 151/97, troponins flat in 20s, neg D-Dimer  11/05: BP normalized, no symptoms, stable for discharge home   Consultants:  none  Procedures/Surgeries: none      ASSESSMENT & PLAN:   Chest pain Likely d/t HTN Reasonably r/o PE w/ neg  CD-Dimer   Treat as below  Hypertensive urgency BP 195/95 --> 151/97 in ED.  Med per med rec  Myocardial injury and history of nonobstructive CAD Troponin 23 --> 22.   PE reasonably r/o w/ negative D-Dimer  Patient is allergic to nitroglycerin Aspirin 324 mg x 1, then 81 mg daily Crestor Check LDL  COPD with asthma  Wheezing per ED physician, resolved by time of admission Does not seem to have COPD exacerbation Bronchodilators, as needed Mucinex   Chronic diastolic CHF (congestive heart failure) does not appear to be in exacerbation  2D echo 05/28/2023 EF 50-55% with grade 3 diastolic dysfunction.  Patient does not have leg edema.  Difficult to assess JVD due to obesity.  His baseline was BNP 53 recently, today his BNP is elevated to 143.9. S/p 1 dose of Lasix 40 mg by IV Continue torsemide and spironolactone   Type II diabetes mellitus with renal manifestations  Recent A1c 9.8, poorly controlled.   Blood sugar 409 with normal anion gap.   At home, taking Trulicity, Humalog, metformin, Lantus 20 units daily Follow outpatient    HLD (hyperlipidemia) Crestor, Zetia   CKD (chronic kidney disease) stage 2, GFR 60-89 ml/min:  Renal function stable.   Baseline creatinine 1.42 on 08/09/2023.   creatinine is 1.26, BUN 20, GFR> 60% on admission Follow BMP   History of gout: Continue allopurinol   Diarrhea:  reports 1 episode of watery bowel movement  today.  No nausea vomiting or abdominal pain.  Low suspicions of C. Difficile If patient continues to have diarrhea, will need to check C. difficile   Tobacco use Nicotine patch Did counseling about importance of quitting tobacco use   Obesity Class 2 (BMI 30-39.9) Body weight 103.7 kg, BMI 36.35 Encouraged losing weight Exercise and healthy diet            Discharge Instructions  Allergies as of 08/20/2023       Reactions   Oxycodone Nausea And Vomiting   Entresto [sacubitril-valsartan] Nausea Only, Other (See  Comments)   Lightheaded and dizzy    Onion Nausea And Vomiting   Nitroglycerin Itching, Rash        Medication List     TAKE these medications    allopurinol 300 MG tablet Commonly known as: ZYLOPRIM TAKE ONE TABLET BY MOUTH DAILY WITH 100 MG TABLET. FOR A TOTAL OF 400 MG DAILY FOR GOUT   amLODipine 5 MG tablet Commonly known as: NORVASC Take 1 tablet (5 mg total) by mouth daily.   aspirin EC 81 MG tablet Take 1 tablet (81 mg total) by mouth daily. Swallow whole. Start taking on: August 21, 2023   carvedilol 25 MG tablet Commonly known as: COREG Take 1 tablet (25 mg total) by mouth 2 (two) times daily.   DULoxetine 30 MG capsule Commonly known as: CYMBALTA Take 60 mg (2 capsules) poqam and 30 mg (1 capsule) poqpm daily   empagliflozin 10 MG Tabs tablet Commonly known as: Jardiance Take 1 tablet (10 mg total) by mouth daily.   ezetimibe 10 MG tablet Commonly known as: ZETIA TAKE ONE TABLET BY MOUTH DAILY   fluticasone 50 MCG/ACT nasal spray Commonly known as: FLONASE Place 1 spray into both nostrils daily. What changed:  when to take this reasons to take this   insulin lispro 100 UNIT/ML KwikPen Commonly known as: HUMALOG Inject 1-45 Units into the skin 3 (three) times daily. Sliding scale up to 45 units per injection   ipratropium-albuterol 0.5-2.5 (3) MG/3ML Soln Commonly known as: DUONEB Take 3 mLs by nebulization every 6 (six) hours as needed (SOB wheeze cough).   Lantus SoloStar 100 UNIT/ML Solostar Pen Generic drug: insulin glargine Inject 20 Units into the skin at bedtime.   levocetirizine 5 MG tablet Commonly known as: XYZAL TAKE ONE TABLET BY MOUTH EVERY EVENING   lisinopril 40 MG tablet Commonly known as: ZESTRIL Take 1 tablet (40 mg total) by mouth daily.   metformin 500 MG (OSM) 24 hr tablet Commonly known as: FORTAMET Take 2,000 mg by mouth at bedtime.   nitroGLYCERIN 0.4 MG SL tablet Commonly known as: NITROSTAT Place 1 tablet  (0.4 mg total) under the tongue every 5 (five) minutes as needed for chest pain.   pantoprazole 40 MG tablet Commonly known as: PROTONIX Take 40 mg by mouth daily.   pregabalin 50 MG capsule Commonly known as: Lyrica Take 1 capsule (50 mg total) by mouth 2 (two) times daily.   rosuvastatin 40 MG tablet Commonly known as: CRESTOR TAKE ONE TABLET BY MOUTH DAILY   Spiriva Respimat 2.5 MCG/ACT Aers Generic drug: Tiotropium Bromide Monohydrate Inhale 2 puffs into the lungs daily.   spironolactone 25 MG tablet Commonly known as: ALDACTONE Take 1 tablet (25 mg total) by mouth daily.   Torsemide 40 MG Tabs Take 40 mg by mouth in the morning.   Trulicity 3 MG/0.5ML Soaj Generic drug: Dulaglutide Inject 3 mg into the skin once a  week. SATURDAYS   Ventolin HFA 108 (90 Base) MCG/ACT inhaler Generic drug: albuterol INHALE TWO PUFFS INTO THE LUNGS EVERY SIX HOURS AS NEEDED FOR WHEEZING OR SHORTNESS OF BREATH         Follow-up Information     Sondra Barges, PA-C. Go in 1 day(s).   Specialties: Cardiology, Radiology Why: keep appointmetn tomorrow Contact information: [address] [place] [address] [phone]                 Allergies  Allergen Reactions   Oxycodone Nausea And Vomiting   Entresto [Sacubitril-Valsartan] Nausea Only and Other (See Comments)    Lightheaded and dizzy    Onion Nausea And Vomiting   Nitroglycerin Itching and Rash     Subjective: pt reports feeling well this morning, no chest pain, ambulating independently   Discharge Exam: BP 122/62 (BP Location: Left Arm)   Pulse 63   Temp 97.8 F (36.6 C)   Resp 16   Ht 5' 6.5" (1.689 m)   Wt 102.5 kg   SpO2 92%   BMI 35.93 kg/m  General: Pt is alert, awake, not in acute distress Cardiovascular: RRR, S1/S2 +, no rubs, no gallops Respiratory: CTA bilaterally, no wheezing, no rhonchi Abdominal: Soft, NT, ND, bowel sounds + Extremities: no edema, no  cyanosis     The results of significant diagnostics from this hospitalization (including imaging, microbiology, ancillary and laboratory) are listed below for reference.     Microbiology: Recent Results (from the past 240 hour(s))  Resp panel by RT-PCR (RSV, Flu A&B, Covid) Anterior Nasal Swab     Status: None   Collection Time: 08/19/23  3:12 PM   Specimen: Anterior Nasal Swab  Result Value Ref Range Status   SARS Coronavirus 2 by RT PCR NEGATIVE NEGATIVE Final    Comment: (NOTE) SARS-CoV-2 target nucleic acids are NOT DETECTED.  The SARS-CoV-2 RNA is generally detectable in upper respiratory specimens during the acute phase of infection. The lowest concentration of SARS-CoV-2 viral copies this assay can detect is 138 copies/mL. A negative result does not preclude SARS-Cov-2 infection and should not be used as the sole basis for treatment or other patient management decisions. A negative result may occur with  improper specimen collection/handling, submission of specimen other than nasopharyngeal swab, presence of viral mutation(s) within the areas targeted by this assay, and inadequate number of viral copies(<138 copies/mL). A negative result must be combined with clinical observations, patient history, and epidemiological information. The expected result is Negative.  Fact Sheet for Patients:  BloggerCourse.com  Fact Sheet for Healthcare Providers:  SeriousBroker.it  This test is no t yet approved or cleared by the [place] FDA and  has been authorized for detection and/or diagnosis of SARS-CoV-2 by FDA under an Emergency Use Authorization (EUA). This EUA will remain  in effect (meaning this test can be used) for the duration of the COVID-19 declaration under Section 564(b)(1) of the Act, 21 U.S.C.section 360bbb-3(b)(1), unless the authorization is terminated  or revoked sooner.       Influenza A by PCR NEGATIVE  NEGATIVE Final   Influenza B by PCR NEGATIVE NEGATIVE Final    Comment: (NOTE) The Xpert Xpress SARS-CoV-2/FLU/RSV plus assay is intended as an aid in the diagnosis of influenza from Nasopharyngeal swab specimens and should not be used as a sole basis for treatment. Nasal washings and aspirates are unacceptable for Xpert Xpress SARS-CoV-2/FLU/RSV testing.  Fact Sheet for Patients: BloggerCourse.com  Fact Sheet  for Healthcare Providers: SeriousBroker.it  This test is not yet approved or cleared by the [place] and has been authorized for detection and/or diagnosis of SARS-CoV-2 by FDA under an Emergency Use Authorization (EUA). This EUA will remain in effect (meaning this test can be used) for the duration of the COVID-19 declaration under Section 564(b)(1) of the Act, 21 U.S.C. section 360bbb-3(b)(1), unless the authorization is terminated or revoked.     Resp Syncytial Virus by PCR NEGATIVE NEGATIVE Final    Comment: (NOTE) Fact Sheet for Patients: BloggerCourse.com  Fact Sheet for Healthcare Providers: SeriousBroker.it  This test is not yet approved or cleared by the [place] FDA and has been authorized for detection and/or diagnosis of SARS-CoV-2 by FDA under an Emergency Use Authorization (EUA). This EUA will remain in effect (meaning this test can be used) for the duration of the COVID-19 declaration under Section 564(b)(1) of the Act, 21 U.S.C. section 360bbb-3(b)(1), unless the authorization is terminated or revoked.  Performed at [facility], [address] [place], [place] [address]      Labs: BNP (last 3 results) Recent Labs    05/27/23 1954 07/09/23 0954 08/19/23 1511  BNP 199.1* 127.8* 143.9*   Basic Metabolic Panel: Recent Labs  Lab 08/19/23 1512 08/20/23 0522  NA 135 136  K 3.6 3.6  CL 100 101  CO2 26 25  GLUCOSE 409*  191*  BUN 20 24*  CREATININE 1.26* 1.34*  CALCIUM 8.6* 8.8*   Liver Function Tests: No results for input(s): "AST", "ALT", "ALKPHOS", "BILITOT", "PROT", "ALBUMIN" in the last 168 hours. No results for input(s): "LIPASE", "AMYLASE" in the last 168 hours. No results for input(s): "AMMONIA" in the last 168 hours. CBC: Recent Labs  Lab 08/19/23 1512 08/20/23 0522  WBC 7.9 10.0  HGB 15.8 15.7  HCT 44.6 45.0  MCV 82.3 83.2  PLT 183 203   Cardiac Enzymes: No results for input(s): "CKTOTAL", "CKMB", "CKMBINDEX", "TROPONINI" in the last 168 hours. BNP: Invalid input(s): "POCBNP" CBG: Recent Labs  Lab 08/19/23 1507 08/19/23 2233 08/20/23 0907  GLUCAP 416* 209* 213*   D-Dimer Recent Labs    08/19/23 2222  DDIMER 0.30   Hgb A1c No results for input(s): "HGBA1C" in the last 72 hours. Lipid Profile Recent Labs    08/19/23 2222  LDLDIRECT 147*   Thyroid function studies No results for input(s): "TSH", "T4TOTAL", "T3FREE", "THYROIDAB" in the last 72 hours.  Invalid input(s): "FREET3" Anemia work up No results for input(s): "VITAMINB12", "FOLATE", "FERRITIN", "TIBC", "IRON", "RETICCTPCT" in the last 72 hours. Urinalysis    Component Value Date/Time   COLORURINE YELLOW (A) 03/25/2023 1236   APPEARANCEUR HAZY (A) 03/25/2023 1236   APPEARANCEUR Clear 04/28/2014 2040   LABSPEC 1.027 03/25/2023 1236   LABSPEC 1.030 04/28/2014 2040   PHURINE 5.0 03/25/2023 1236   GLUCOSEU 150 (A) 03/25/2023 1236   GLUCOSEU Negative 04/28/2014 2040   HGBUR NEGATIVE 03/25/2023 1236   BILIRUBINUR NEGATIVE 03/25/2023 1236   BILIRUBINUR Negative 06/27/2022 1527   BILIRUBINUR Negative 04/28/2014 2040   KETONESUR NEGATIVE 03/25/2023 1236   PROTEINUR >=300 (A) 03/25/2023 1236   UROBILINOGEN 0.2 06/27/2022 1527   NITRITE NEGATIVE 03/25/2023 1236   LEUKOCYTESUR NEGATIVE 03/25/2023 1236   LEUKOCYTESUR Negative 04/28/2014 2040   Sepsis Labs Recent Labs  Lab 08/19/23 1512 08/20/23 0522  WBC  7.9 10.0   Microbiology Recent Results (from the past 240 hour(s))  Resp panel by RT-PCR (RSV, Flu A&B, Covid) Anterior Nasal Swab  Status: None   Collection Time: 08/19/23  3:12 PM   Specimen: Anterior Nasal Swab  Result Value Ref Range Status   SARS Coronavirus 2 by RT PCR NEGATIVE NEGATIVE Final    Comment: (NOTE) SARS-CoV-2 target nucleic acids are NOT DETECTED.  The SARS-CoV-2 RNA is generally detectable in upper respiratory specimens during the acute phase of infection. The lowest concentration of SARS-CoV-2 viral copies this assay can detect is 138 copies/mL. A negative result does not preclude SARS-Cov-2 infection and should not be used as the sole basis for treatment or other patient management decisions. A negative result may occur with  improper specimen collection/handling, submission of specimen other than nasopharyngeal swab, presence of viral mutation(s) within the areas targeted by this assay, and inadequate number of viral copies(<138 copies/mL). A negative result must be combined with clinical observations, patient history, and epidemiological information. The expected result is Negative.  Fact Sheet for Patients:  BloggerCourse.com  Fact Sheet for Healthcare Providers:  SeriousBroker.it  This test is no t yet approved or cleared by the [place] FDA and  has been authorized for detection and/or diagnosis of SARS-CoV-2 by FDA under an Emergency Use Authorization (EUA). This EUA will remain  in effect (meaning this test can be used) for the duration of the COVID-19 declaration under Section 564(b)(1) of the Act, 21 U.S.C.section 360bbb-3(b)(1), unless the authorization is terminated  or revoked sooner.       Influenza A by PCR NEGATIVE NEGATIVE Final   Influenza B by PCR NEGATIVE NEGATIVE Final    Comment: (NOTE) The Xpert Xpress SARS-CoV-2/FLU/RSV plus assay is intended as an aid in the diagnosis of  influenza from Nasopharyngeal swab specimens and should not be used as a sole basis for treatment. Nasal washings and aspirates are unacceptable for Xpert Xpress SARS-CoV-2/FLU/RSV testing.  Fact Sheet for Patients: BloggerCourse.com  Fact Sheet for Healthcare Providers: SeriousBroker.it  This test is not yet approved or cleared by the [place] FDA and has been authorized for detection and/or diagnosis of SARS-CoV-2 by FDA under an Emergency Use Authorization (EUA). This EUA will remain in effect (meaning this test can be used) for the duration of the COVID-19 declaration under Section 564(b)(1) of the Act, 21 U.S.C. section 360bbb-3(b)(1), unless the authorization is terminated or revoked.     Resp Syncytial Virus by PCR NEGATIVE NEGATIVE Final    Comment: (NOTE) Fact Sheet for Patients: BloggerCourse.com  Fact Sheet for Healthcare Providers: SeriousBroker.it  This test is not yet approved or cleared by the [place] FDA and has been authorized for detection and/or diagnosis of SARS-CoV-2 by FDA under an Emergency Use Authorization (EUA). This EUA will remain in effect (meaning this test can be used) for the duration of the COVID-19 declaration under Section 564(b)(1) of the Act, 21 U.S.C. section 360bbb-3(b)(1), unless the authorization is terminated or revoked.  Performed at [facility], [address], [place], [place] [address]    Imaging DG Chest 2 View  Result Date: 08/19/2023 CLINICAL DATA:  Chest pain.  Shortness of breath. EXAM: CHEST - 2 VIEW COMPARISON:  05/27/2023. FINDINGS: Bilateral lung fields are clear. Bilateral costophrenic angles are clear. Stable cardio-mediastinal silhouette. No acute osseous abnormalities. Cervicothoracic spinal fixation hardware is again seen. The soft tissues are within normal limits. IMPRESSION: *No active  cardiopulmonary disease. Electronically Signed   By: Jules Schick M.D.   On: 08/19/2023 16:53      Time coordinating discharge: over 30 minutes  SIGNED:  Sunnie Nielsen DO  [facility]

## 2023-08-20 NOTE — Inpatient Diabetes Management (Signed)
Inpatient Diabetes Program Recommendations  AACE/ADA: New Consensus Statement on Inpatient Glycemic Control (2015)  Target Ranges:  Prepandial:   less than 140 mg/dL      Peak postprandial:   less than 180 mg/dL (1-2 hours)      Critically ill patients:  140 - 180 mg/dL   Lab Results  Component Value Date   GLUCAP 213 (H) 08/20/2023   HGBA1C 9.8 (H) 08/09/2023    Latest Reference Range & Units 08/19/23 15:07 08/19/23 22:33 08/20/23 09:07  Glucose-Capillary 70 - 99 mg/dL 259 (H) 563 (H) 875 (H)  (H): Data is abnormally high  Diabetes history: DM 2 Outpatient Diabetes medications:  Lantus 20 units q HS, Humalog 8 units tid with meals, Jardiance 10 mg daily, Metformin 1000 mg q HS, Trulicity 3 mg daily Current orders for Inpatient glycemic control:  Novolog 0-15 units tid with meals and 0-5 HS Semglee 20 units daily  Inpatient Diabetes Program Recommendations:   Please consider: -Add Novolog 4 units tid meal coverage if eats 50% meals  Ordered Living Well With Diabetes booklet for patient review.  Thank you, Billy Fischer. Kitai Purdom, RN, MSN, CDCES  Diabetes Coordinator Inpatient Glycemic Control Team Team Pager [phone] (8am-5pm) 08/20/2023 9:31 AM

## 2023-08-20 NOTE — Hospital Course (Signed)
HPI: DELFIN Rivas is a 57 y.o. male with medical history significant of HTN, HLD, DM, CAD, COPD/asthma, dCHF, gout, CKD-2, tobacco abuse, depression, who presents with chest pain/pressure x2 days, located in the front chest, pressure-like, mild to moderate, nonradiating, aggravated with deep breath. Patient has shortness breath and cough with little yellow sputum production.  Of note,  patient had admission for hypertensive urgency in August 2024 about 3 mos ago. He had cardiac cath on 05/29/23 which showed nonobstructive CAD.   Hospital course / significant events:  11/04: to ED, Bp 195/95 --> 151/97, troponins flat in 20s, neg D-Dimer  11/05:   Consultants:  ***  Procedures/Surgeries: ***      ASSESSMENT & PLAN:   Chest pain Likely d/t HTN Reasonably r/o PE w/ neg CD-Dimer   Treat as below  Hypertensive urgency BP 195/95 --> 151/97 in ED.  continue home Coreg, lisinopril Patient is on torsemide and spironolactone for CHF Titrate meds ***  Myocardial injury and history of nonobstructive CAD Troponin 23 --> 22.   PE reasonably r/o w/ negative D-Dimer  As needed fentanyl for chest pain Patient is allergic to nitroglycerin Aspirin 324 mg x 1, then 81 mg daily Crestor Check LDL trend trop  COPD with asthma  Wheezing per ED physician, resolved by time of admission Does not seem to have COPD exacerbation *** Bronchodilators, as needed Mucinex   Chronic diastolic CHF (congestive heart failure) 2D echo 05/28/2023 EF 50-55% with grade 3 diastolic dysfunction.  Patient does not have leg edema.  Difficult to assess JVD due to obesity.  His baseline was BNP 53 recently, today his BNP is elevated to 143.9. S/p 1 dose of Lasix 40 mg by IV Continue torsemide and spironolactone   Type II diabetes mellitus with renal manifestations  Recent A1c 9.8, poorly controlled.   Blood sugar 409 with normal anion gap.   At home, taking Trulicity, Humalog, metformin, Lantus 20 units  daily Sliding scale insulin Glargine insulin 20 units daily   HLD (hyperlipidemia) Crestor, Zetia   CKD (chronic kidney disease) stage 2, GFR 60-89 ml/min:  Renal function stable.   Baseline creatinine 1.42 on 08/09/2023.   creatinine is 1.26, BUN 20, GFR> 60% on admission Follow BMP   History of gout: Continue allopurinol   Diarrhea:  reports 1 episode of watery bowel movement today.  No nausea vomiting or abdominal pain.  Low suspicions of C. Difficile If patient continues to have diarrhea, will need to check C. difficile   Tobacco use Nicotine patch Did counseling about importance of quitting tobacco use   Obesity (BMI 30-39.9) Body weight 103.7 kg, BMI 36.35 Encouraged losing weight Exercise and healthy diet     DVT prophylaxis: *** IV fluids: *** continuous IV fluids  Nutrition: *** Central lines / invasive devices: ***  Code Status: *** ACP documentation reviewed: *** none on file in [facility]  TOC needs: *** Barriers to dispo / significant pending items: ***

## 2023-08-21 ENCOUNTER — Ambulatory Visit: Payer: Medicaid Other | Attending: Physician Assistant | Admitting: Physician Assistant

## 2023-08-21 ENCOUNTER — Encounter: Payer: Self-pay | Admitting: Physician Assistant

## 2023-08-21 VITALS — BP 130/78 | HR 58 | Ht 66.0 in | Wt 223.8 lb

## 2023-08-21 DIAGNOSIS — Z6835 Body mass index (BMI) 35.0-35.9, adult: Secondary | ICD-10-CM

## 2023-08-21 DIAGNOSIS — I2510 Atherosclerotic heart disease of native coronary artery without angina pectoris: Secondary | ICD-10-CM | POA: Diagnosis not present

## 2023-08-21 DIAGNOSIS — E785 Hyperlipidemia, unspecified: Secondary | ICD-10-CM

## 2023-08-21 DIAGNOSIS — I10 Essential (primary) hypertension: Secondary | ICD-10-CM | POA: Diagnosis not present

## 2023-08-21 DIAGNOSIS — I5032 Chronic diastolic (congestive) heart failure: Secondary | ICD-10-CM | POA: Diagnosis not present

## 2023-08-21 DIAGNOSIS — E66812 Obesity, class 2: Secondary | ICD-10-CM | POA: Diagnosis not present

## 2023-08-21 DIAGNOSIS — Z79899 Other long term (current) drug therapy: Secondary | ICD-10-CM

## 2023-08-21 DIAGNOSIS — G4733 Obstructive sleep apnea (adult) (pediatric): Secondary | ICD-10-CM | POA: Diagnosis not present

## 2023-08-21 MED ORDER — TORSEMIDE 40 MG PO TABS: 40.0000 mg | ORAL_TABLET | Freq: Every day | ORAL | 3 refills | Status: DC

## 2023-08-21 NOTE — Patient Instructions (Signed)
Medication Instructions:  Your physician recommends the following medication changes.  START TAKING: Torsemide 40 mg daily with and extra 40 mg dose on Monday, Wednesday, and Friday  *If you need a refill on your cardiac medications before your next appointment, please call your pharmacy*   Lab Work: Your provider would like for you to return in 1 week to have the following labs drawn: BMT.   Please go to [facility] [address] ([facility] [address], [place] [address] You do not need an appointment.  They are open from 7:30 am-4 pm.  Lunch from 1:00 pm- 2:00 pm You DO NOT need to be fasting.   If you have labs (blood work) drawn today and your tests are completely normal, you will receive your results only by: MyChart Message (if you have MyChart) OR A paper copy in the mail If you have any lab test that is abnormal or we need to change your treatment, we will call you to review the results.  Follow-Up: At [facility], you and your health needs are our priority.  As part of our continuing mission to provide you with exceptional heart care, we have created designated Provider Care Teams.  These Care Teams include your primary Cardiologist (physician) and Advanced Practice Providers (APPs -  Physician Assistants and Nurse Practitioners) who all work together to provide you with the care you need, when you need it.  We recommend signing up for the patient portal called "MyChart".  Sign up information is provided on this After Visit Summary.  MyChart is used to connect with patients for Virtual Visits (Telemedicine).  Patients are able to view lab/test results, encounter notes, upcoming appointments, etc.  Non-urgent messages can be sent to your provider as well.   To learn more about what you can do with MyChart, go to [email or web address].    Your next appointment:   2 month(s)  Provider:   You may see Debbe Odea, MD or one of the following  Advanced Practice Providers on your designated Care Team:   Eula Listen, [place]

## 2023-08-23 ENCOUNTER — Ambulatory Visit: Payer: Medicaid Other | Admitting: Physician Assistant

## 2023-08-23 NOTE — Progress Notes (Deleted)
Established Patient Office Visit  Name: Cory Rivas   MRN: [id number]    DOB: [birth year]   Date:08/23/2023  Today's Provider: Jacquelin Hawking, MHS, PA-C Introduced myself to the patient as a PA-C and provided education on APPs in clinical practice.         Subjective  Chief Complaint  No chief complaint on file.   HPI   Patient Active Problem List   Diagnosis Date Noted   Myocardial injury 08/19/2023   Type II diabetes mellitus with renal manifestations (HCC) 08/19/2023   Obesity (BMI 30-39.9) 08/19/2023   HLD (hyperlipidemia) 08/19/2023   Chronic diastolic CHF (congestive heart failure) (HCC) 08/19/2023   Diarrhea 08/19/2023   CAD (coronary artery disease) 08/19/2023   CKD (chronic kidney disease) stage 2, GFR 60-89 ml/min 08/19/2023   Acute on chronic diastolic heart failure (HCC) 05/30/2023   Unstable angina (HCC) 05/29/2023   Chest pain 05/27/2023   Type 2 diabetes mellitus with peripheral neuropathy (HCC) 05/27/2023   GERD without esophagitis 05/27/2023   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   Dyslipidemia 05/27/2023   Acute decompensated heart failure (HCC) 11/23/2022   Hypertensive emergency 11/22/2022   Acute respiratory failure with hypoxia (HCC) 11/22/2022   Elevated troponin 11/22/2022   Acute on chronic heart failure with preserved ejection fraction (HFpEF) (HCC) 08/24/2022   Acute on chronic diastolic congestive heart failure (HCC) 08/23/2022   Class 2 obesity 08/23/2022   Type 2 diabetes mellitus with hyperglycemia (HCC) 08/23/2022   Grade I diastolic dysfunction 06/08/2022   LVH (left ventricular hypertrophy) 06/08/2022   Accelerated hypertension 06/08/2022   S/P cervical spinal fusion 08/18/2021   Former heavy tobacco smoker 08/18/2021   Gout 08/18/2021   Other chronic pain 10/26/2020   Systolic murmur 10/26/2020   Tobacco use 10/26/2020   Polyp of ascending colon    Rectal polyp    Class 2 severe obesity with serious comorbidity and body  mass index (BMI) of 39.0 to 39.9 in adult [facility]) 04/15/2019   Chronic kidney disease (CKD) stage G2/A3, mildly decreased glomerular filtration rate (GFR) between 60-89 mL/min/1.73 square meter and albuminuria creatinine ratio greater than 300 mg/g 04/15/2019   Proteinuria 04/15/2019   Hyperlipidemia associated with type 2 diabetes mellitus (HCC) 03/18/2019   Diabetes mellitus (HCC) 03/18/2019   Idiopathic chronic gout of multiple sites without tophus 03/18/2019   Essential hypertension 03/18/2019   Dermatitis 03/18/2019   Lumbar degenerative disc disease 11/22/2015   Cervical myelopathy (HCC) 11/22/2015   Osteoarthritis of spine with radiculopathy, lumbar region 11/22/2015    Past Surgical History:  Procedure Laterality Date   CHOLECYSTECTOMY     COLONOSCOPY WITH PROPOFOL N/A 03/10/2020   Procedure: COLONOSCOPY WITH PROPOFOL;  Surgeon: Midge Minium, MD;  [facility] ENDOSCOPY;  Service: Endoscopy;  Laterality: N/A;   CORONARY PRESSURE/FFR STUDY N/A 05/29/2023   Procedure: CORONARY PRESSURE/FFR STUDY;  Surgeon: Yvonne Kendall, MD;  Location: [facility] INVASIVE CV LAB;  Service: Cardiovascular;  Laterality: N/A;   RIGHT/LEFT HEART CATH AND CORONARY ANGIOGRAPHY N/A 05/29/2023   Procedure: RIGHT/LEFT HEART CATH AND CORONARY ANGIOGRAPHY;  Surgeon: Yvonne Kendall, MD;  Location: [facility] INVASIVE CV LAB;  Service: Cardiovascular;  Laterality: N/A;    Family History  Problem Relation Age of Onset   Heart disease Mother    Diabetes Mother    Hyperlipidemia Mother    Hypertension Mother    Heart attack Mother    Lung cancer Father    Hypertension Father  Stroke Father    AAA (abdominal aortic aneurysm) Maternal Grandmother    Heart attack Maternal Grandmother    Diabetes Maternal Grandfather     Social History   Tobacco Use   Smoking status: Some Days    Current packs/day: 0.00    Average packs/day: 1 pack/day for 37.0 years (37.0 ttl pk-yrs)    Types: Cigarettes    Start date:  10/19/1989    Last attempt to quit: 08/20/2022    Years since quitting: 1.0   Smokeless tobacco: Never   Tobacco comments:    30+ years hx smoking 2 ppd, stopped smoking briefly in April 2022 for a surgery and then restarted smoking about 1/2 ppd last 4-5 months         5-6cigs daily- 04/10/2023  Substance Use Topics   Alcohol use: No     Current Outpatient Medications:    allopurinol (ZYLOPRIM) 300 MG tablet, TAKE ONE TABLET BY MOUTH DAILY WITH 100 MG TABLET. FOR A TOTAL OF 400 MG DAILY FOR GOUT, Disp: 90 tablet, Rfl: 1   amLODipine (NORVASC) 5 MG tablet, Take 1 tablet (5 mg total) by mouth daily., Disp: 30 tablet, Rfl: 0   aspirin EC 81 MG tablet, Take 1 tablet (81 mg total) by mouth daily. Swallow whole., Disp: 30 tablet, Rfl: 0   carvedilol (COREG) 25 MG tablet, Take 1 tablet (25 mg total) by mouth 2 (two) times daily., Disp: 180 tablet, Rfl: 2   DULoxetine (CYMBALTA) 30 MG capsule, Take 60 mg (2 capsules) poqam and 30 mg (1 capsule) poqpm daily, Disp: 270 capsule, Rfl: 1   empagliflozin (JARDIANCE) 10 MG TABS tablet, Take 1 tablet (10 mg total) by mouth daily., Disp: 90 tablet, Rfl: 1   ezetimibe (ZETIA) 10 MG tablet, TAKE ONE TABLET BY MOUTH DAILY, Disp: 90 tablet, Rfl: 3   fluticasone (FLONASE) 50 MCG/ACT nasal spray, Place 1 spray into both nostrils daily. (Patient taking differently: Place 1 spray into both nostrils daily as needed for rhinitis or allergies.), Disp: 18.2 mL, Rfl: 6   insulin glargine (LANTUS SOLOSTAR) 100 UNIT/ML Solostar Pen, Inject 20 Units into the skin at bedtime., Disp: , Rfl:    insulin lispro (HUMALOG) 100 UNIT/ML KwikPen, Inject 1-45 Units into the skin 3 (three) times daily. Sliding scale up to 45 units per injection, Disp: , Rfl:    ipratropium-albuterol (DUONEB) 0.5-2.5 (3) MG/3ML SOLN, Take 3 mLs by nebulization every 6 (six) hours as needed (SOB wheeze cough)., Disp: 180 mL, Rfl: 1   levocetirizine (XYZAL) 5 MG tablet, TAKE ONE TABLET BY MOUTH EVERY  EVENING, Disp: 90 tablet, Rfl: 1   lisinopril (ZESTRIL) 40 MG tablet, Take 1 tablet (40 mg total) by mouth daily., Disp: 90 tablet, Rfl: 3   metformin (FORTAMET) 500 MG (OSM) 24 hr tablet, Take 2,000 mg by mouth at bedtime., Disp: , Rfl:    nitroGLYCERIN (NITROSTAT) 0.4 MG SL tablet, Place 1 tablet (0.4 mg total) under the tongue every 5 (five) minutes as needed for chest pain., Disp: 15 tablet, Rfl: 0   pantoprazole (PROTONIX) 40 MG tablet, Take 40 mg by mouth daily. (Patient not taking: Reported on 08/21/2023), Disp: , Rfl:    pregabalin (LYRICA) 50 MG capsule, Take 1 capsule (50 mg total) by mouth 2 (two) times daily., Disp: 60 capsule, Rfl: 2   rosuvastatin (CRESTOR) 40 MG tablet, TAKE ONE TABLET BY MOUTH DAILY, Disp: 90 tablet, Rfl: 3   spironolactone (ALDACTONE) 25 MG tablet, Take 1 tablet (25 mg  total) by mouth daily., Disp: 90 tablet, Rfl: 1   Tiotropium Bromide Monohydrate (SPIRIVA RESPIMAT) 2.5 MCG/ACT AERS, Inhale 2 puffs into the lungs daily., Disp: 30 each, Rfl: 11   Torsemide 40 MG TABS, Take 40 mg by mouth daily. Take 1 additional 40 mg tablet on Mon, Wed, and Friday morning., Disp: 135 tablet, Rfl: 3   TRULICITY 3 MG/0.5ML SOPN, Inject 3 mg into the skin once a week. SATURDAYS, Disp: , Rfl:    VENTOLIN HFA 108 (90 Base) MCG/ACT inhaler, INHALE TWO PUFFS INTO THE LUNGS EVERY SIX HOURS AS NEEDED FOR WHEEZING OR SHORTNESS OF BREATH, Disp: 18 each, Rfl: 2 No current facility-administered medications for this visit.  Facility-Administered Medications Ordered in Other Visits:    albuterol (PROVENTIL) (2.5 MG/3ML) 0.083% nebulizer solution 2.5 mg, 2.5 mg, Nebulization, Once, Dgayli, Khabib, MD  Allergies  Allergen Reactions   Oxycodone Nausea And Vomiting   Entresto [Sacubitril-Valsartan] Nausea Only and Other (See Comments)    Lightheaded and dizzy    Onion Nausea And Vomiting   Nitroglycerin Itching and Rash    I personally reviewed {Reviewed:[id number]} with the patient/caregiver  today.   ROS    Objective  There were no vitals filed for this visit.  There is no height or weight on file to calculate BMI.  Physical Exam   Recent Results (from the past 2160 hour(s))  CBG monitoring, ED     Status: Abnormal   Collection Time: 05/27/23  7:49 PM  Result Value Ref Range   Glucose-Capillary 195 (H) 70 - 99 mg/dL    Comment: Glucose reference range applies only to samples taken after fasting for at least 8 hours.  CBC     Status: None   Collection Time: 05/27/23  7:54 PM  Result Value Ref Range   WBC 8.8 4.0 - 10.5 K/uL   RBC 5.50 4.22 - 5.81 MIL/uL   Hemoglobin 15.8 13.0 - 17.0 g/dL   HCT 28.4 13.2 - 44.0 %   MCV 83.3 80.0 - 100.0 fL   MCH 28.7 26.0 - 34.0 pg   MCHC 34.5 30.0 - 36.0 g/dL   RDW 10.2 72.5 - 36.6 %   Platelets 225 150 - 400 K/uL   nRBC 0.0 0.0 - 0.2 %    Comment: Performed at [facility], [address] [place], [place] [address]  Troponin I (High Sensitivity)     Status: Abnormal   Collection Time: 05/27/23  7:54 PM  Result Value Ref Range   Troponin I (High Sensitivity) 26 (H) <18 ng/L    Comment: (NOTE) Elevated high sensitivity troponin I (hsTnI) values and significant  changes across serial measurements may suggest ACS but many other  chronic and acute conditions are known to elevate hsTnI results.  Refer to the "Links" section for chest pain algorithms and additional  guidance. Performed at [facility], [address] [place], [place] [address]   Comprehensive metabolic panel     Status: Abnormal   Collection Time: 05/27/23  7:54 PM  Result Value Ref Range   Sodium 138 135 - 145 mmol/L   Potassium 3.7 3.5 - 5.1 mmol/L   Chloride 101 98 - 111 mmol/L   CO2 26 22 - 32 mmol/L   Glucose, Bld 197 (H) 70 - 99 mg/dL    Comment: Glucose reference range applies only to samples taken after fasting for at least 8 hours.   BUN 18 6 - 20 mg/dL   Creatinine, Ser 5.63 (H) 0.61 -  1.24 mg/dL   Calcium 9.0 8.9 -  40.9 mg/dL   Total Protein 7.5 6.5 - 8.1 g/dL   Albumin 3.8 3.5 - 5.0 g/dL   AST 15 15 - 41 U/L   ALT 17 0 - 44 U/L   Alkaline Phosphatase 69 38 - 126 U/L   Total Bilirubin 0.8 0.3 - 1.2 mg/dL   GFR, Estimated 50 (L) >60 mL/min    Comment: (NOTE) Calculated using the CKD-EPI Creatinine Equation (2021)    Anion gap 11 5 - 15    Comment: Performed at [facility], [address] [place], [place] [address]  Brain natriuretic peptide     Status: Abnormal   Collection Time: 05/27/23  7:54 PM  Result Value Ref Range   B Natriuretic Peptide 199.1 (H) 0.0 - 100.0 pg/mL    Comment: Performed at [facility], [address] [place], [place] [address]  Troponin I (High Sensitivity)     Status: Abnormal   Collection Time: 05/27/23  9:26 PM  Result Value Ref Range   Troponin I (High Sensitivity) 24 (H) <18 ng/L    Comment: (NOTE) Elevated high sensitivity troponin I (hsTnI) values and significant  changes across serial measurements may suggest ACS but many other  chronic and acute conditions are known to elevate hsTnI results.  Refer to the "Links" section for chest pain algorithms and additional  guidance. Performed at [facility], [address] [place], [place] [address]   Basic metabolic panel     Status: Abnormal   Collection Time: 05/28/23  3:00 AM  Result Value Ref Range   Sodium 138 135 - 145 mmol/L   Potassium 3.6 3.5 - 5.1 mmol/L   Chloride 103 98 - 111 mmol/L   CO2 25 22 - 32 mmol/L   Glucose, Bld 185 (H) 70 - 99 mg/dL    Comment: Glucose reference range applies only to samples taken after fasting for at least 8 hours.   BUN 23 (H) 6 - 20 mg/dL   Creatinine, Ser 0.86 (H) 0.61 - 1.24 mg/dL   Calcium 8.6 (L) 8.9 - 10.3 mg/dL   GFR, Estimated 57 (L) >60 mL/min    Comment: (NOTE) Calculated using the CKD-EPI Creatinine Equation (2021)    Anion gap 10 5 - 15    Comment: Performed at [facility], [address] [place], [place]  [address]  CBC     Status: None   Collection Time: 05/28/23  3:00 AM  Result Value Ref Range   WBC 9.6 4.0 - 10.5 K/uL   RBC 5.25 4.22 - 5.81 MIL/uL   Hemoglobin 15.3 13.0 - 17.0 g/dL   HCT 96.2 95.2 - 84.1 %   MCV 83.6 80.0 - 100.0 fL   MCH 29.1 26.0 - 34.0 pg   MCHC 34.9 30.0 - 36.0 g/dL   RDW 32.4 40.1 - 02.7 %   Platelets 212 150 - 400 K/uL   nRBC 0.0 0.0 - 0.2 %    Comment: Performed at [facility], [address] [name], [place] [address]  ECHOCARDIOGRAM COMPLETE     Status: None   Collection Time: 05/28/23  1:56 PM  Result Value Ref Range   Weight 3,672 oz   Height 66 in   BP 179/96 mmHg   Ao pk vel 1.91 m/s   AV Area VTI 2.70 cm2   AR max vel 2.74 cm2   AV Mean grad 7.3 mmHg   AV Peak grad 14.5  mmHg   Single Plane A2C EF 60.1 %   Single Plane A4C EF 55.9 %   Calc EF 58.2 %   S' Lateral 4.15 cm   AV Area mean vel 2.72 cm2   Area-P 1/2 3.17 cm2   MV VTI 2.47 cm2   Est EF 50 - 55%   Glucose, capillary     Status: Abnormal   Collection Time: 05/28/23  6:36 PM  Result Value Ref Range   Glucose-Capillary 252 (H) 70 - 99 mg/dL    Comment: Glucose reference range applies only to samples taken after fasting for at least 8 hours.  Troponin I (High Sensitivity)     Status: None   Collection Time: 05/28/23  8:26 PM  Result Value Ref Range   Troponin I (High Sensitivity) 17 <18 ng/L    Comment: (NOTE) Elevated high sensitivity troponin I (hsTnI) values and significant  changes across serial measurements may suggest ACS but many other  chronic and acute conditions are known to elevate hsTnI results.  Refer to the "Links" section for chest pain algorithms and additional  guidance. Performed at [facility], [address] [place], [place] [address]   Glucose, capillary     Status: Abnormal   Collection Time: 05/28/23  8:33 PM  Result Value Ref Range   Glucose-Capillary 242 (H) 70 - 99 mg/dL    Comment: Glucose reference range applies only to  samples taken after fasting for at least 8 hours.  Troponin I (High Sensitivity)     Status: None   Collection Time: 05/28/23 10:15 PM  Result Value Ref Range   Troponin I (High Sensitivity) 17 <18 ng/L    Comment: (NOTE) Elevated high sensitivity troponin I (hsTnI) values and significant  changes across serial measurements may suggest ACS but many other  chronic and acute conditions are known to elevate hsTnI results.  Refer to the "Links" section for chest pain algorithms and additional  guidance. Performed at [facility], [address] [place], [place] [address]   Basic metabolic panel     Status: Abnormal   Collection Time: 05/29/23  5:38 AM  Result Value Ref Range   Sodium 139 135 - 145 mmol/L   Potassium 4.1 3.5 - 5.1 mmol/L   Chloride 106 98 - 111 mmol/L   CO2 25 22 - 32 mmol/L   Glucose, Bld 169 (H) 70 - 99 mg/dL    Comment: Glucose reference range applies only to samples taken after fasting for at least 8 hours.   BUN 22 (H) 6 - 20 mg/dL   Creatinine, Ser 0.98 (H) 0.61 - 1.24 mg/dL   Calcium 8.7 (L) 8.9 - 10.3 mg/dL   GFR, Estimated 56 (L) >60 mL/min    Comment: (NOTE) Calculated using the CKD-EPI Creatinine Equation (2021)    Anion gap 8 5 - 15    Comment: Performed at [facility], [address] [place], [place] [address]  Protime-INR     Status: None   Collection Time: 05/29/23  5:38 AM  Result Value Ref Range   Prothrombin Time 14.8 11.4 - 15.2 seconds   INR 1.1 0.8 - 1.2    Comment: (NOTE) INR goal varies based on device and disease states. Performed at [facility], [address] [place], [place] [address]   CBC     Status: None   Collection Time: 05/29/23  5:38 AM  Result Value Ref Range   WBC 10.0 4.0 - 10.5 K/uL  RBC 5.56 4.22 - 5.81 MIL/uL   Hemoglobin 16.0 13.0 - 17.0 g/dL   HCT 52.8 41.3 - 24.4 %   MCV 85.6 80.0 - 100.0 fL   MCH 28.8 26.0 - 34.0 pg   MCHC 33.6 30.0 - 36.0 g/dL   RDW 01.0 27.2 - 53.6 %    Platelets 221 150 - 400 K/uL   nRBC 0.0 0.0 - 0.2 %    Comment: Performed at [facility], [address] [place], [place] [address]  Glucose, capillary     Status: Abnormal   Collection Time: 05/29/23  9:25 AM  Result Value Ref Range   Glucose-Capillary 136 (H) 70 - 99 mg/dL    Comment: Glucose reference range applies only to samples taken after fasting for at least 8 hours.  Glucose, capillary     Status: Abnormal   Collection Time: 05/29/23 11:40 AM  Result Value Ref Range   Glucose-Capillary 110 (H) 70 - 99 mg/dL    Comment: Glucose reference range applies only to samples taken after fasting for at least 8 hours.  Glucose, capillary     Status: Abnormal   Collection Time: 05/29/23  1:57 PM  Result Value Ref Range   Glucose-Capillary 110 (H) 70 - 99 mg/dL    Comment: Glucose reference range applies only to samples taken after fasting for at least 8 hours.  POCT I-Stat EG7     Status: None   Collection Time: 05/29/23  3:12 PM  Result Value Ref Range   pH, Ven 7.340 7.25 - 7.43   pCO2, Ven 49.0 44 - 60 mmHg   pO2, Ven 36 32 - 45 mmHg   Bicarbonate 26.4 20.0 - 28.0 mmol/L   TCO2 28 22 - 32 mmol/L   O2 Saturation 64 %   Acid-Base Excess 0.0 0.0 - 2.0 mmol/L   Sodium 141 135 - 145 mmol/L   Potassium 3.9 3.5 - 5.1 mmol/L   Calcium, Ion 1.15 1.15 - 1.40 mmol/L   HCT 44.0 39.0 - 52.0 %   Hemoglobin 15.0 13.0 - 17.0 g/dL   Sample type MIXED VENOUS SAMPLE    Comment NOTIFIED PHYSICIAN   I-STAT 7, (LYTES, BLD GAS, ICA, H+H)     Status: Abnormal   Collection Time: 05/29/23  3:13 PM  Result Value Ref Range   pH, Arterial 7.351 7.35 - 7.45   pCO2 arterial 45.4 32 - 48 mmHg   pO2, Arterial 63 (L) 83 - 108 mmHg   Bicarbonate 25.1 20.0 - 28.0 mmol/L   TCO2 26 22 - 32 mmol/L   O2 Saturation 91 %   Acid-base deficit 1.0 0.0 - 2.0 mmol/L   Sodium 141 135 - 145 mmol/L   Potassium 4.0 3.5 - 5.1 mmol/L   Calcium, Ion 1.16 1.15 - 1.40 mmol/L   HCT 45.0 39.0 - 52.0 %    Hemoglobin 15.3 13.0 - 17.0 g/dL   Sample type ARTERIAL   POCT Activated clotting time     Status: None   Collection Time: 05/29/23  3:34 PM  Result Value Ref Range   Activated Clotting Time 287 seconds    Comment: Reference range 74-137 seconds for patients not on anticoagulant therapy.  Glucose, capillary     Status: Abnormal   Collection Time: 05/29/23  6:05 PM  Result Value Ref Range   Glucose-Capillary 154 (H) 70 - 99 mg/dL    Comment: Glucose reference range applies only to samples taken after fasting for at least 8 hours.  Glucose, capillary     Status: Abnormal   Collection Time: 05/29/23  8:40 PM  Result Value Ref Range   Glucose-Capillary 194 (H) 70 - 99 mg/dL    Comment: Glucose reference range applies only to samples taken after fasting for at least 8 hours.  Basic metabolic panel     Status: Abnormal   Collection Time: 05/30/23  5:51 AM  Result Value Ref Range   Sodium 139 135 - 145 mmol/L   Potassium 4.3 3.5 - 5.1 mmol/L   Chloride 106 98 - 111 mmol/L   CO2 24 22 - 32 mmol/L   Glucose, Bld 155 (H) 70 - 99 mg/dL    Comment: Glucose reference range applies only to samples taken after fasting for at least 8 hours.   BUN 21 (H) 6 - 20 mg/dL   Creatinine, Ser 1.61 (H) 0.61 - 1.24 mg/dL   Calcium 8.3 (L) 8.9 - 10.3 mg/dL   GFR, Estimated 56 (L) >60 mL/min    Comment: (NOTE) Calculated using the CKD-EPI Creatinine Equation (2021)    Anion gap 9 5 - 15    Comment: Performed at [facility], [address] [place], [place] [address]  Lipoprotein A (LPA)     Status: None   Collection Time: 05/30/23  5:51 AM  Result Value Ref Range   Lipoprotein (a) <8.4 <75.0 nmol/L    Comment: (NOTE) **Results verified by repeat testing** Note:  Values greater than or equal to 75.0 nmol/L may       indicate an independent risk factor for CHD,       but must be evaluated with caution when applied       to non-Caucasian populations due to the       influence of genetic  factors on Lp(a) across       ethnicities. Performed At: [facility] [address] [place], [place] [id number] Jolene Schimke MD YN:[id number]   Glucose, capillary     Status: Abnormal   Collection Time: 05/30/23  7:37 AM  Result Value Ref Range   Glucose-Capillary 153 (H) 70 - 99 mg/dL    Comment: Glucose reference range applies only to samples taken after fasting for at least 8 hours.  Glucose, capillary     Status: Abnormal   Collection Time: 05/30/23 11:23 AM  Result Value Ref Range   Glucose-Capillary 173 (H) 70 - 99 mg/dL    Comment: Glucose reference range applies only to samples taken after fasting for at least 8 hours.  Basic metabolic panel     Status: Abnormal   Collection Time: 06/04/23  9:39 AM  Result Value Ref Range   Glucose 190 (H) 70 - 99 mg/dL   BUN 22 6 - 24 mg/dL   Creatinine, Ser 6.57 (H) 0.76 - 1.27 mg/dL   eGFR 53 (L) >84 ON/GEX/5.28   BUN/Creatinine Ratio 14 9 - 20   Sodium 139 134 - 144 mmol/L   Potassium 4.9 3.5 - 5.2 mmol/L   Chloride 100 96 - 106 mmol/L   CO2 24 20 - 29 mmol/L   Calcium 9.1 8.7 - 10.2 mg/dL  Comprehensive metabolic panel     Status: Abnormal   Collection Time: 06/21/23 11:01 AM  Result Value Ref Range   Sodium 136 135 - 145 mmol/L   Potassium 4.2 3.5 - 5.1 mmol/L   Chloride 98 98 - 111 mmol/L   CO2 27 22 - 32 mmol/L   Glucose, Bld 190 (H) 70 - 99  mg/dL    Comment: Glucose reference range applies only to samples taken after fasting for at least 8 hours.   BUN 18 6 - 20 mg/dL   Creatinine, Ser 4.01 (H) 0.61 - 1.24 mg/dL   Calcium 9.4 8.9 - 02.7 mg/dL   Total Protein 8.3 (H) 6.5 - 8.1 g/dL   Albumin 4.6 3.5 - 5.0 g/dL   AST 17 15 - 41 U/L   ALT 19 0 - 44 U/L   Alkaline Phosphatase 73 38 - 126 U/L   Total Bilirubin 0.7 0.3 - 1.2 mg/dL   GFR, Estimated >25 >36 mL/min    Comment: (NOTE) Calculated using the CKD-EPI Creatinine Equation (2021)    Anion gap 11 5 - 15    Comment: Performed at [facility], [address] [place], [place] [address]  Basic metabolic panel     Status: Abnormal   Collection Time: 07/09/23  9:54 AM  Result Value Ref Range   Sodium 137 135 - 145 mmol/L   Potassium 4.0 3.5 - 5.1 mmol/L   Chloride 102 98 - 111 mmol/L   CO2 24 22 - 32 mmol/L   Glucose, Bld 203 (H) 70 - 99 mg/dL    Comment: Glucose reference range applies only to samples taken after fasting for at least 8 hours.   BUN 16 6 - 20 mg/dL   Creatinine, Ser 4.74 0.61 - 1.24 mg/dL   Calcium 8.9 8.9 - 25.9 mg/dL   GFR, Estimated >56 >38 mL/min    Comment: (NOTE) Calculated using the CKD-EPI Creatinine Equation (2021)    Anion gap 11 5 - 15    Comment: Performed at [facility], [address] [place], [place] [address]  Brain natriuretic peptide     Status: Abnormal   Collection Time: 07/09/23  9:54 AM  Result Value Ref Range   B Natriuretic Peptide 127.8 (H) 0.0 - 100.0 pg/mL    Comment: Performed at [facility], [address], [place], [place] [address]  Troponin I (High Sensitivity)     Status: None   Collection Time: 07/09/23  9:59 AM  Result Value Ref Range   Troponin I (High Sensitivity) 14 <18 ng/L    Comment: (NOTE) Elevated high sensitivity troponin I (hsTnI) values and significant  changes across serial measurements may suggest ACS but many other  chronic and acute conditions are known to elevate hsTnI results.  Refer to the "Links" section for chest pain algorithms and additional  guidance. Performed at [facility], [address] [place], [place] [address]   CK     Status: None   Collection Time: 07/09/23  9:59 AM  Result Value Ref Range   Total CK 155 49 - 397 U/L    Comment: Performed at [facility], [address] [place], [place] [address]  HgB A1c     Status: Abnormal   Collection Time: 08/09/23 11:56 AM  Result Value Ref Range   Hgb A1c MFr Bld 9.8 (H) <5.7 % of total Hgb    Comment: For someone without known diabetes, a  hemoglobin A1c value of 6.5% or greater indicates that they may have  diabetes and this should be confirmed with a follow-up  test. . For someone with known diabetes, a value <7% indicates  that their diabetes is well controlled and a value  greater than or equal to 7% indicates suboptimal  control. A1c targets should be individualized based on  duration of diabetes, age,  comorbid conditions, and  other considerations. . Currently, no consensus exists regarding use of hemoglobin A1c for diagnosis of diabetes for children. .    Mean Plasma Glucose 235 mg/dL   eAG (mmol/L) 30.8 mmol/L  Lipid Profile     Status: Abnormal   Collection Time: 08/09/23 11:56 AM  Result Value Ref Range   Cholesterol 241 (H) <200 mg/dL   HDL 38 (L) > OR = 40 mg/dL   Triglycerides 657 (H) <150 mg/dL    Comment: . If a non-fasting specimen was collected, consider repeat triglyceride testing on a fasting specimen if clinically indicated.  Perry Mount et al. J. of Clin. Lipidol. 2015;9:129-169. Marland Kitchen    LDL Cholesterol (Calc)  mg/dL (calc)    Comment: . LDL cholesterol not calculated. Triglyceride levels greater than 400 mg/dL invalidate calculated LDL results. . Reference range: <100 . Desirable range <100 mg/dL for primary prevention;   <70 mg/dL for patients with CHD or diabetic patients  with > or = 2 CHD risk factors. Marland Kitchen LDL-C is now calculated using the Martin-Hopkins  calculation, which is a validated novel method providing  better accuracy than the Friedewald equation in the  estimation of LDL-C.  Horald Pollen et al. Lenox Ahr. 8469;629(52): 2061-2068  (http://education.[facility].com/faq/FAQ164)    Total CHOL/HDL Ratio 6.3 (H) <5.0 (calc)   Non-HDL Cholesterol (Calc) 203 (H) <130 mg/dL (calc)    Comment: For patients with diabetes plus 1 major ASCVD risk  factor, treating to a non-HDL-C goal of <100 mg/dL  (LDL-C of <84 mg/dL) is considered a therapeutic  option.   COMPLETE METABOLIC PANEL  WITH GFR     Status: Abnormal   Collection Time: 08/09/23 11:56 AM  Result Value Ref Range   Glucose, Bld 214 (H) 65 - 99 mg/dL    Comment: .            Fasting reference interval . For someone without known diabetes, a glucose value >125 mg/dL indicates that they may have diabetes and this should be confirmed with a follow-up test. .    BUN 19 7 - 25 mg/dL   Creat 1.32 (H) 4.40 - 1.30 mg/dL   eGFR 58 (L) > OR = 60 mL/min/1.27m2   BUN/Creatinine Ratio 13 6 - 22 (calc)   Sodium 138 135 - 146 mmol/L   Potassium 4.2 3.5 - 5.3 mmol/L   Chloride 102 98 - 110 mmol/L   CO2 28 20 - 32 mmol/L   Calcium 9.0 8.6 - 10.3 mg/dL   Total Protein 7.1 6.1 - 8.1 g/dL   Albumin 4.0 3.6 - 5.1 g/dL   Globulin 3.1 1.9 - 3.7 g/dL (calc)   AG Ratio 1.3 1.0 - 2.5 (calc)   Total Bilirubin 0.7 0.2 - 1.2 mg/dL   Alkaline phosphatase (APISO) 80 35 - 144 U/L   AST 23 10 - 35 U/L   ALT 18 9 - 46 U/L  CBC w/Diff/Platelet     Status: Abnormal   Collection Time: 08/09/23 11:56 AM  Result Value Ref Range   WBC 10.5 3.8 - 10.8 Thousand/uL   RBC 5.70 4.20 - 5.80 Million/uL   Hemoglobin 16.6 13.2 - 17.1 g/dL   HCT 10.2 (H) 72.5 - 36.6 %   MCV 87.9 80.0 - 100.0 fL   MCH 29.1 27.0 - 33.0 pg   MCHC 33.1 32.0 - 36.0 g/dL    Comment: For adults, a slight decrease in the calculated MCHC value (in the range of 30 to 32 g/dL) is most likely  not clinically significant; however, it should be interpreted with caution in correlation with other red cell parameters and the patient's clinical condition.    RDW 13.3 11.0 - 15.0 %   Platelets 250 140 - 400 Thousand/uL   MPV 10.2 7.5 - 12.5 fL   Neutro Abs 7,151 1,500 - 7,800 cells/uL   Absolute Lymphocytes 2,520 850 - 3,900 cells/uL   Absolute Monocytes 599 200 - 950 cells/uL   Eosinophils Absolute 189 15 - 500 cells/uL   Basophils Absolute 42 0 - 200 cells/uL   Neutrophils Relative % 68.1 %   Total Lymphocyte 24.0 %   Monocytes Relative 5.7 %   Eosinophils  Relative 1.8 %   Basophils Relative 0.4 %  CBG monitoring, ED     Status: Abnormal   Collection Time: 08/19/23  3:07 PM  Result Value Ref Range   Glucose-Capillary 416 (H) 70 - 99 mg/dL    Comment: Glucose reference range applies only to samples taken after fasting for at least 8 hours.  Brain natriuretic peptide     Status: Abnormal   Collection Time: 08/19/23  3:11 PM  Result Value Ref Range   B Natriuretic Peptide 143.9 (H) 0.0 - 100.0 pg/mL    Comment: Performed at [facility], [address] [place], [place] [address]  Basic metabolic panel     Status: Abnormal   Collection Time: 08/19/23  3:12 PM  Result Value Ref Range   Sodium 135 135 - 145 mmol/L   Potassium 3.6 3.5 - 5.1 mmol/L   Chloride 100 98 - 111 mmol/L   CO2 26 22 - 32 mmol/L   Glucose, Bld 409 (H) 70 - 99 mg/dL    Comment: Glucose reference range applies only to samples taken after fasting for at least 8 hours.   BUN 20 6 - 20 mg/dL   Creatinine, Ser 4.13 (H) 0.61 - 1.24 mg/dL   Calcium 8.6 (L) 8.9 - 10.3 mg/dL   GFR, Estimated >24 >40 mL/min    Comment: (NOTE) Calculated using the CKD-EPI Creatinine Equation (2021)    Anion gap 9 5 - 15    Comment: Performed at [facility], [address] [place], [place] [address]  CBC     Status: None   Collection Time: 08/19/23  3:12 PM  Result Value Ref Range   WBC 7.9 4.0 - 10.5 K/uL   RBC 5.42 4.22 - 5.81 MIL/uL   Hemoglobin 15.8 13.0 - 17.0 g/dL   HCT 53.6 64.4 - 03.4 %   MCV 82.3 80.0 - 100.0 fL   MCH 29.2 26.0 - 34.0 pg   MCHC 35.4 30.0 - 36.0 g/dL   RDW 74.2 59.5 - 63.8 %   Platelets 183 150 - 400 K/uL   nRBC 0.0 0.0 - 0.2 %    Comment: Performed at [facility], [address] [place], [place] [address]  Troponin I (High Sensitivity)     Status: Abnormal   Collection Time: 08/19/23  3:12 PM  Result Value Ref Range   Troponin I (High Sensitivity) 23 (H) <18 ng/L    Comment: (NOTE) Elevated high sensitivity troponin I  (hsTnI) values and significant  changes across serial measurements may suggest ACS but many other  chronic and acute conditions are known to elevate hsTnI results.  Refer to the "Links" section for chest pain algorithms and additional  guidance. Performed at [facility], [address], [place], [place] [address]   Resp panel by RT-PCR (  RSV, Flu A&B, Covid) Anterior Nasal Swab     Status: None   Collection Time: 08/19/23  3:12 PM   Specimen: Anterior Nasal Swab  Result Value Ref Range   SARS Coronavirus 2 by RT PCR NEGATIVE NEGATIVE    Comment: (NOTE) SARS-CoV-2 target nucleic acids are NOT DETECTED.  The SARS-CoV-2 RNA is generally detectable in upper respiratory specimens during the acute phase of infection. The lowest concentration of SARS-CoV-2 viral copies this assay can detect is 138 copies/mL. A negative result does not preclude SARS-Cov-2 infection and should not be used as the sole basis for treatment or other patient management decisions. A negative result may occur with  improper specimen collection/handling, submission of specimen other than nasopharyngeal swab, presence of viral mutation(s) within the areas targeted by this assay, and inadequate number of viral copies(<138 copies/mL). A negative result must be combined with clinical observations, patient history, and epidemiological information. The expected result is Negative.  Fact Sheet for Patients:  BloggerCourse.com  Fact Sheet for Healthcare Providers:  SeriousBroker.it  This test is no t yet approved or cleared by the [place] FDA and  has been authorized for detection and/or diagnosis of SARS-CoV-2 by FDA under an Emergency Use Authorization (EUA). This EUA will remain  in effect (meaning this test can be used) for the duration of the COVID-19 declaration under Section 564(b)(1) of the Act, 21 U.S.C.section 360bbb-3(b)(1), unless the  authorization is terminated  or revoked sooner.       Influenza A by PCR NEGATIVE NEGATIVE   Influenza B by PCR NEGATIVE NEGATIVE    Comment: (NOTE) The Xpert Xpress SARS-CoV-2/FLU/RSV plus assay is intended as an aid in the diagnosis of influenza from Nasopharyngeal swab specimens and should not be used as a sole basis for treatment. Nasal washings and aspirates are unacceptable for Xpert Xpress SARS-CoV-2/FLU/RSV testing.  Fact Sheet for Patients: BloggerCourse.com  Fact Sheet for Healthcare Providers: SeriousBroker.it  This test is not yet approved or cleared by the [place] FDA and has been authorized for detection and/or diagnosis of SARS-CoV-2 by FDA under an Emergency Use Authorization (EUA). This EUA will remain in effect (meaning this test can be used) for the duration of the COVID-19 declaration under Section 564(b)(1) of the Act, 21 U.S.C. section 360bbb-3(b)(1), unless the authorization is terminated or revoked.     Resp Syncytial Virus by PCR NEGATIVE NEGATIVE    Comment: (NOTE) Fact Sheet for Patients: BloggerCourse.com  Fact Sheet for Healthcare Providers: SeriousBroker.it  This test is not yet approved or cleared by the [place] FDA and has been authorized for detection and/or diagnosis of SARS-CoV-2 by FDA under an Emergency Use Authorization (EUA). This EUA will remain in effect (meaning this test can be used) for the duration of the COVID-19 declaration under Section 564(b)(1) of the Act, 21 U.S.C. section 360bbb-3(b)(1), unless the authorization is terminated or revoked.  Performed at [facility], [address] [place], [place] [address]   Beta-hydroxybutyric acid     Status: None   Collection Time: 08/19/23  3:12 PM  Result Value Ref Range   Beta-Hydroxybutyric Acid 0.08 0.05 - 0.27 mmol/L    Comment: Performed at [facility], [address] [place], [place] [address]  Troponin I (High Sensitivity)     Status: Abnormal   Collection Time: 08/19/23  5:17 PM  Result Value Ref Range   Troponin I (High Sensitivity) 22 (H) <18 ng/L    Comment: (NOTE) Elevated high sensitivity troponin  I (hsTnI) values and significant  changes across serial measurements may suggest ACS but many other  chronic and acute conditions are known to elevate hsTnI results.  Refer to the "Links" section for chest pain algorithms and additional  guidance. Performed at [facility], [address] [place], [place] [address]   D-dimer, quantitative     Status: None   Collection Time: 08/19/23 10:22 PM  Result Value Ref Range   D-Dimer, Quant 0.30 0.00 - 0.50 ug/mL-FEU    Comment: (NOTE) At the manufacturer cut-off value of 0.5 g/mL FEU, this assay has a negative predictive value of 95-100%.This assay is intended for use in conjunction with a clinical pretest probability (PTP) assessment model to exclude pulmonary embolism (PE) and deep venous thrombosis (DVT) in outpatients suspected of PE or DVT. Results should be correlated with clinical presentation. Performed at [facility], [address] [place], [place] [address]   Protime-INR     Status: None   Collection Time: 08/19/23 10:22 PM  Result Value Ref Range   Prothrombin Time 14.2 11.4 - 15.2 seconds   INR 1.1 0.8 - 1.2    Comment: (NOTE) INR goal varies based on device and disease states. Performed at [facility], [address] [place], [place] [address]   APTT     Status: None   Collection Time: 08/19/23 10:22 PM  Result Value Ref Range   aPTT 28 24 - 36 seconds    Comment: Performed at [facility], [address] [place], [place] [address]  LDL cholesterol, direct     Status: Abnormal   Collection Time: 08/19/23 10:22 PM  Result Value Ref Range   Direct LDL 147 (H) 0 - 99 mg/dL    Comment: Performed at [facility], [address], [place], [place] [address]  Troponin I (High Sensitivity)     Status: Abnormal   Collection Time: 08/19/23 10:22 PM  Result Value Ref Range   Troponin I (High Sensitivity) 22 (H) <18 ng/L    Comment: (NOTE) Elevated high sensitivity troponin I (hsTnI) values and significant  changes across serial measurements may suggest ACS but many other  chronic and acute conditions are known to elevate hsTnI results.  Refer to the "Links" section for chest pain algorithms and additional  guidance. Performed at [facility], [address] [place], [place] [address]   CBG monitoring, ED     Status: Abnormal   Collection Time: 08/19/23 10:33 PM  Result Value Ref Range   Glucose-Capillary 209 (H) 70 - 99 mg/dL    Comment: Glucose reference range applies only to samples taken after fasting for at least 8 hours.  Urine Drug Screen, Qualitative ([facility] only)     Status: None   Collection Time: 08/19/23 11:43 PM  Result Value Ref Range   Tricyclic, Ur Screen NONE DETECTED NONE DETECTED   Amphetamines, Ur Screen NONE DETECTED NONE DETECTED   MDMA (Ecstasy)Ur Screen NONE DETECTED NONE DETECTED   Cocaine Metabolite,Ur [place] NONE DETECTED NONE DETECTED   Opiate, Ur Screen NONE DETECTED NONE DETECTED   Phencyclidine (PCP) Ur S NONE DETECTED NONE DETECTED   Cannabinoid 50 Ng, Ur [place] NONE DETECTED NONE DETECTED   Barbiturates, Ur Screen NONE DETECTED NONE DETECTED   Benzodiazepine, Ur Scrn NONE DETECTED NONE DETECTED   Methadone Scn, Ur NONE DETECTED NONE DETECTED    Comment: (NOTE) Tricyclics + metabolites, urine    Cutoff 1000 ng/mL Amphetamines + metabolites, urine  Cutoff 1000 ng/mL MDMA (Ecstasy), urine              Cutoff 500 ng/mL Cocaine Metabolite, urine          Cutoff 300 ng/mL Opiate + metabolites, urine        Cutoff 300 ng/mL Phencyclidine (PCP), urine         Cutoff 25 ng/mL Cannabinoid, urine                 Cutoff 50 ng/mL Barbiturates + metabolites,  urine  Cutoff 200 ng/mL Benzodiazepine, urine              Cutoff 200 ng/mL Methadone, urine                   Cutoff 300 ng/mL  The urine drug screen provides only a preliminary, unconfirmed analytical test result and should not be used for non-medical purposes. Clinical consideration and professional judgment should be applied to any positive drug screen result due to possible interfering substances. A more specific alternate chemical method must be used in order to obtain a confirmed analytical result. Gas chromatography / mass spectrometry (GC/MS) is the preferred confirm atory method. Performed at [facility], [address] [place], [place] [address]   Basic metabolic panel     Status: Abnormal   Collection Time: 08/20/23  5:22 AM  Result Value Ref Range   Sodium 136 135 - 145 mmol/L   Potassium 3.6 3.5 - 5.1 mmol/L   Chloride 101 98 - 111 mmol/L   CO2 25 22 - 32 mmol/L   Glucose, Bld 191 (H) 70 - 99 mg/dL    Comment: Glucose reference range applies only to samples taken after fasting for at least 8 hours.   BUN 24 (H) 6 - 20 mg/dL   Creatinine, Ser 1.91 (H) 0.61 - 1.24 mg/dL   Calcium 8.8 (L) 8.9 - 10.3 mg/dL   GFR, Estimated >47 >82 mL/min    Comment: (NOTE) Calculated using the CKD-EPI Creatinine Equation (2021)    Anion gap 10 5 - 15    Comment: Performed at [facility], [address] [place], [place] [address]  CBC     Status: None   Collection Time: 08/20/23  5:22 AM  Result Value Ref Range   WBC 10.0 4.0 - 10.5 K/uL   RBC 5.41 4.22 - 5.81 MIL/uL   Hemoglobin 15.7 13.0 - 17.0 g/dL   HCT 30.8 65.7 - 84.6 %   MCV 83.2 80.0 - 100.0 fL   MCH 29.0 26.0 - 34.0 pg   MCHC 34.9 30.0 - 36.0 g/dL   RDW 96.2 95.2 - 84.1 %   Platelets 203 150 - 400 K/uL   nRBC 0.0 0.0 - 0.2 %    Comment: Performed at [facility], [address] [place], [place] [address]  Glucose, capillary     Status: Abnormal   Collection Time: 08/20/23  9:07 AM   Result Value Ref Range   Glucose-Capillary 213 (H) 70 - 99 mg/dL    Comment: Glucose reference range applies only to samples taken after fasting for at least 8 hours.     PHQ2/9:    08/09/2023   10:55 AM 07/26/2023    2:03 PM 06/21/2023    9:42 AM 03/25/2023   11:44 AM 03/21/2023   11:16 AM  Depression screen PHQ 2/9  Decreased Interest 0 0 0 0 0  Down, Depressed, Hopeless 0 0 0 0 0  PHQ - 2 Score 0 0 0 0 0  Altered sleeping 0 0 0 0 0  Tired, decreased energy 0 0 0 3 0  Change in appetite 0 0 0 1 0  Feeling bad or failure about yourself  0 0 0 0 0  Trouble concentrating 0 0 0 0 0  Moving slowly or fidgety/restless 0 0 0 0 0  Suicidal thoughts 0 0 0 0 0  PHQ-9 Score 0 0 0 4 0  Difficult doing work/chores Not difficult at all Not difficult at all Not difficult at all  Not difficult at all      Fall Risk:    08/09/2023   10:54 AM 07/26/2023    2:02 PM 06/21/2023    9:42 AM 03/25/2023   11:44 AM 03/21/2023   11:16 AM  Fall Risk   Falls in the past year? 0 0 0 1 1  Number falls in past yr: 0 0 0 0 1  Injury with Fall? 0 0 0 1 1  Risk for fall due to : No Fall Risks No Fall Risks No Fall Risks Impaired balance/gait Impaired balance/gait  Follow up Falls prevention discussed;Falls evaluation completed;Education provided Falls prevention discussed;Education provided;Falls evaluation completed Falls prevention discussed;Education provided;Falls evaluation completed Falls prevention discussed Falls prevention discussed;Education provided;Falls evaluation completed      Functional Status Survey:      Assessment & Plan

## 2023-08-26 ENCOUNTER — Ambulatory Visit: Payer: Medicaid Other | Admitting: Physician Assistant

## 2023-08-28 ENCOUNTER — Ambulatory Visit: Payer: Medicaid Other | Admitting: Physician Assistant

## 2023-08-28 ENCOUNTER — Encounter: Payer: Self-pay | Admitting: Physician Assistant

## 2023-08-28 VITALS — BP 160/94 | HR 69 | Temp 97.7°F | Resp 16 | Ht 66.0 in | Wt 227.7 lb

## 2023-08-28 DIAGNOSIS — E1165 Type 2 diabetes mellitus with hyperglycemia: Secondary | ICD-10-CM

## 2023-08-28 DIAGNOSIS — Z794 Long term (current) use of insulin: Secondary | ICD-10-CM

## 2023-08-28 DIAGNOSIS — I5032 Chronic diastolic (congestive) heart failure: Secondary | ICD-10-CM

## 2023-08-28 DIAGNOSIS — J4489 Other specified chronic obstructive pulmonary disease: Secondary | ICD-10-CM

## 2023-08-28 NOTE — Progress Notes (Unsigned)
Acute Office Visit   Patient: Cory Rivas   DOB: 1966-09-05   57 y.o. Male  MRN: [id number] Visit Date: 08/28/2023  Today's healthcare provider: Oswaldo Conroy Princesa Willig, PA-C  Introduced myself to the patient as a Secondary school teacher and provided education on APPs in clinical practice.    Chief Complaint  Patient presents with   Diabetes   Hyperlipidemia   Subjective    HPI    Reviewed hospital visit notes, imaging, and labs from 08/19/23-08/20/23 Reviewed Cardiology notes from 08/21/23  He reports he has been taking his medications as directed States he took his meds at 8 AM this morning and then takes others at night before bed He reports he has been consistent with medications -even prior to hospitalization   He reports he would like to move to [place] [facility] to avoid traveling to [facility] but was denied He is still going to [facility] for now  He reports he has started taking Torsemide 40 mg PO every day and second dose on MWF as directed by Cardiology   He reports he trying to watch sugar intake - states it usually runs 115-120 daily  He is using finger prick testing right now   He states he has not been following his normal diet for the past 3 weeks or so States he got into some candy and got his sugars up too high but has stopped intake now  He reports he was also eating fast food last week  He thinks he has an apt with Endo at the end of this month? He is not sure when it is   Medications: Outpatient Medications Prior to Visit  Medication Sig   allopurinol (ZYLOPRIM) 300 MG tablet TAKE ONE TABLET BY MOUTH DAILY WITH 100 MG TABLET. FOR A TOTAL OF 400 MG DAILY FOR GOUT   amLODipine (NORVASC) 5 MG tablet Take 1 tablet (5 mg total) by mouth daily.   aspirin EC 81 MG tablet Take 1 tablet (81 mg total) by mouth daily. Swallow whole.   carvedilol (COREG) 25 MG tablet Take 1 tablet (25 mg total) by mouth 2 (two) times daily.   DULoxetine (CYMBALTA) 30 MG capsule Take 60 mg (2 capsules)  poqam and 30 mg (1 capsule) poqpm daily   empagliflozin (JARDIANCE) 10 MG TABS tablet Take 1 tablet (10 mg total) by mouth daily.   ezetimibe (ZETIA) 10 MG tablet TAKE ONE TABLET BY MOUTH DAILY   fluticasone (FLONASE) 50 MCG/ACT nasal spray Place 1 spray into both nostrils daily. (Patient taking differently: Place 1 spray into both nostrils daily as needed for rhinitis or allergies.)   insulin glargine (LANTUS SOLOSTAR) 100 UNIT/ML Solostar Pen Inject 20 Units into the skin at bedtime.   insulin lispro (HUMALOG) 100 UNIT/ML KwikPen Inject 1-45 Units into the skin 3 (three) times daily. Sliding scale up to 45 units per injection   ipratropium-albuterol (DUONEB) 0.5-2.5 (3) MG/3ML SOLN Take 3 mLs by nebulization every 6 (six) hours as needed (SOB wheeze cough).   levocetirizine (XYZAL) 5 MG tablet TAKE ONE TABLET BY MOUTH EVERY EVENING   lisinopril (ZESTRIL) 40 MG tablet Take 1 tablet (40 mg total) by mouth daily.   metformin (FORTAMET) 500 MG (OSM) 24 hr tablet Take 2,000 mg by mouth at bedtime.   pantoprazole (PROTONIX) 40 MG tablet Take 40 mg by mouth daily.   pregabalin (LYRICA) 50 MG capsule Take 1 capsule (50 mg total) by mouth 2 (two) times  daily.   rosuvastatin (CRESTOR) 40 MG tablet TAKE ONE TABLET BY MOUTH DAILY   spironolactone (ALDACTONE) 25 MG tablet Take 1 tablet (25 mg total) by mouth daily.   Tiotropium Bromide Monohydrate (SPIRIVA RESPIMAT) 2.5 MCG/ACT AERS Inhale 2 puffs into the lungs daily.   Torsemide 40 MG TABS Take 40 mg by mouth daily. Take 1 additional 40 mg tablet on Mon, Wed, and Friday morning.   TRULICITY 3 MG/0.5ML SOPN Inject 3 mg into the skin once a week. SATURDAYS   VENTOLIN HFA 108 (90 Base) MCG/ACT inhaler INHALE TWO PUFFS INTO THE LUNGS EVERY SIX HOURS AS NEEDED FOR WHEEZING OR SHORTNESS OF BREATH   nitroGLYCERIN (NITROSTAT) 0.4 MG SL tablet Place 1 tablet (0.4 mg total) under the tongue every 5 (five) minutes as needed for chest pain.   Facility-Administered  Medications Prior to Visit  Medication Dose Route Frequency Provider   albuterol (PROVENTIL) (2.5 MG/3ML) 0.083% nebulizer solution 2.5 mg  2.5 mg Nebulization Once Raechel Chute, MD    Review of Systems  Eyes:  Negative for visual disturbance.  Respiratory:  Negative for chest tightness, shortness of breath and wheezing.   Cardiovascular:  Negative for chest pain, palpitations and leg swelling.  Gastrointestinal:  Negative for diarrhea, nausea and vomiting.  Endocrine: Positive for polydipsia (last week), polyphagia and polyuria.  Genitourinary:  Positive for frequency.  Neurological:  Negative for dizziness, light-headedness and headaches.    {Insert previous labs (optional):23779} {See past labs  Heme  Chem  Endocrine  Serology  Results Review (optional):1}   Objective    BP (!) 162/96   Pulse 69   Temp 97.7 F (36.5 C) (Oral)   Resp 16   Ht 5\' 6"  (1.676 m)   Wt 227 lb 11.2 oz (103.3 kg)   SpO2 98%   BMI 36.75 kg/m  {Insert last BP/Wt (optional):23777}{See vitals history (optional):1}   Physical Exam Vitals reviewed.  Constitutional:      General: He is awake.     Appearance: Normal appearance. He is well-developed and well-groomed.  HENT:     Head: Normocephalic and atraumatic.  Cardiovascular:     Rate and Rhythm: Normal rate and regular rhythm.     Pulses: Normal pulses.          Radial pulses are 2+ on the right side and 2+ on the left side.       Dorsalis pedis pulses are 2+ on the right side and 2+ on the left side.     Heart sounds: Heart sounds not distant. No murmur heard.    Gallop present. S3 sounds present.  Pulmonary:     Effort: Pulmonary effort is normal.     Breath sounds: Decreased air movement present. Decreased breath sounds present. No wheezing, rhonchi or rales.  Musculoskeletal:     Cervical back: Normal range of motion and neck supple.     Right lower leg: No edema.     Left lower leg: No edema.  Neurological:     General: No focal  deficit present.     Mental Status: He is alert and oriented to person, place, and time.     GCS: GCS eye subscore is 4. GCS verbal subscore is 5. GCS motor subscore is 6.  Psychiatric:        Behavior: Behavior is cooperative.       No results found for any visits on 08/28/23.  Assessment & Plan      No follow-ups on file.  Problem List Items Addressed This Visit       Cardiovascular and Mediastinum   Chronic diastolic CHF (congestive heart failure) (HCC)     Respiratory   COPD with asthma (HCC)     Endocrine   Type 2 diabetes mellitus with hyperglycemia (HCC) - Primary   Relevant Orders   HM DIABETES FOOT EXAM (Completed)     No follow-ups on file.   I, Kinisha Soper E Janicia Monterrosa, PA-C, have reviewed all documentation for this visit. The documentation on 08/28/23 for the exam, diagnosis, procedures, and orders are all accurate and complete.   Jacquelin Hawking, MHS, PA-C [facility]

## 2023-08-29 NOTE — Assessment & Plan Note (Addendum)
Chronic, historic condition, poorly controlled  He is seeing Endo for management - continue collaboration with them Reviewed that with his medication regimen and insulin, his glucose levels and A1c should not be as elevated as they are and he will need to improve medication compliance and diet  He reports he has not been monitoring sugar intake or eating like he should the past 2-3 weeks and has plans to resume regular diet going forward Encouraged this to improve overall health and reduce risk of complications. Follow up in 3 months or sooner if concerns arise

## 2023-08-29 NOTE — Assessment & Plan Note (Addendum)
Chronic, historic condition Patient is currently taking torsemide 40 mg p.o. daily with an extra dose of 40 mg MWF per cardiology, carvedilol 25 mg p.o. daily, spironolactone 25 mg p.o. daily, lisinopril 40 mg p.o. daily, amlodipine 5 mg p.o. daily He was recently hospitalized for hypertensive emergency.  Reviewed hospital visit notes, imaging, labs from 08/19/2023 - 08/20/2023. Suspect symptoms were likely secondary to CHF exacerbation  with resulting demand ischemia as BNP was elevated over baseline during hospitalization No lower extremity edema present today but he seems to have S3/gallop present on cardiac auscultation- unsure if this is normal for him  I also reviewed cardiology notes from 08/21/2023.  He reports that he has been consistent with his medications.  We reviewed that his blood pressure and blood glucose levels should not be as high as they are with consistent medication use.  He admits to eating more fast food and excess sugars for the past few weeks.  Reviewed that he needs to follow his recommended dietary restrictions to prevent exacerbations.  Patient voiced understanding and agreement Continue collaboration with Cardiology  Follow up in 3 months or sooner if concerns arise

## 2023-08-31 ENCOUNTER — Other Ambulatory Visit: Payer: Self-pay

## 2023-08-31 ENCOUNTER — Emergency Department
Admission: EM | Admit: 2023-08-31 | Discharge: 2023-08-31 | Disposition: A | Discharge status: Home / Self Care | Payer: Medicaid Other | Attending: Emergency Medicine | Admitting: Emergency Medicine

## 2023-08-31 ENCOUNTER — Emergency Department: Payer: Medicaid Other

## 2023-08-31 DIAGNOSIS — E1165 Type 2 diabetes mellitus with hyperglycemia: Secondary | ICD-10-CM | POA: Insufficient documentation

## 2023-08-31 DIAGNOSIS — I509 Heart failure, unspecified: Secondary | ICD-10-CM | POA: Insufficient documentation

## 2023-08-31 DIAGNOSIS — J449 Chronic obstructive pulmonary disease, unspecified: Secondary | ICD-10-CM | POA: Insufficient documentation

## 2023-08-31 DIAGNOSIS — J181 Lobar pneumonia, unspecified organism: Secondary | ICD-10-CM | POA: Insufficient documentation

## 2023-08-31 DIAGNOSIS — J189 Pneumonia, unspecified organism: Secondary | ICD-10-CM

## 2023-08-31 DIAGNOSIS — I160 Hypertensive urgency: Secondary | ICD-10-CM | POA: Insufficient documentation

## 2023-08-31 DIAGNOSIS — I2510 Atherosclerotic heart disease of native coronary artery without angina pectoris: Secondary | ICD-10-CM | POA: Insufficient documentation

## 2023-08-31 DIAGNOSIS — Z4789 Encounter for other orthopedic aftercare: Secondary | ICD-10-CM | POA: Diagnosis not present

## 2023-08-31 DIAGNOSIS — R079 Chest pain, unspecified: Secondary | ICD-10-CM | POA: Diagnosis not present

## 2023-08-31 DIAGNOSIS — R918 Other nonspecific abnormal finding of lung field: Secondary | ICD-10-CM | POA: Diagnosis not present

## 2023-08-31 DIAGNOSIS — I110 Hypertensive heart disease with heart failure: Secondary | ICD-10-CM | POA: Diagnosis not present

## 2023-08-31 DIAGNOSIS — R0789 Other chest pain: Secondary | ICD-10-CM

## 2023-08-31 DIAGNOSIS — J168 Pneumonia due to other specified infectious organisms: Secondary | ICD-10-CM | POA: Diagnosis not present

## 2023-08-31 LAB — BASIC METABOLIC PANEL
Anion gap: 11 (ref 5–15)
BUN: 13 mg/dL (ref 6–20)
CO2: 24 mmol/L (ref 22–32)
Calcium: 8.8 mg/dL — ABNORMAL LOW (ref 8.9–10.3)
Chloride: 101 mmol/L (ref 98–111)
Creatinine, Ser: 1.2 mg/dL (ref 0.61–1.24)
GFR, Estimated: >60 mL/min (ref >60)
Glucose, Bld: 257 mg/dL — ABNORMAL HIGH (ref 70–99)
Potassium: 3.9 mmol/L (ref 3.5–5.1)
Sodium: 136 mmol/L (ref 135–145)

## 2023-08-31 LAB — CBC
HCT: 46.2 % (ref 39.0–52.0)
Hemoglobin: 16.1 g/dL (ref 13.0–17.0)
MCH: 29.3 pg (ref 26.0–34.0)
MCHC: 34.8 g/dL (ref 30.0–36.0)
MCV: 84 fL (ref 80.0–100.0)
Platelets: 223 10*3/uL (ref 150–400)
RBC: 5.5 MIL/uL (ref 4.22–5.81)
RDW: 12.7 % (ref 11.5–15.5)
WBC: 7.8 10*3/uL (ref 4.0–10.5)
nRBC: 0 % (ref 0.0–0.2)

## 2023-08-31 LAB — D-DIMER, QUANTITATIVE: D-Dimer, Quant: 0.27 ug{FEU}/mL (ref 0.00–0.50)

## 2023-08-31 LAB — TROPONIN I (HIGH SENSITIVITY): Troponin I (High Sensitivity): 11 ng/L (ref <18)

## 2023-08-31 LAB — MAGNESIUM: Magnesium: 1.7 mg/dL (ref 1.7–2.4)

## 2023-08-31 MED ORDER — AZITHROMYCIN 500 MG PO TABS: 500.0000 mg | ORAL_TABLET | Freq: Every day | ORAL | 0 refills | Status: AC

## 2023-08-31 MED ORDER — AMOXICILLIN-POT CLAVULANATE 875-125 MG PO TABS: 1.0000 | ORAL_TABLET | Freq: Two times a day (BID) | ORAL | 0 refills | Status: AC

## 2023-08-31 MED ORDER — HYDRALAZINE HCL 20 MG/ML IJ SOLN
5.0000 mg | Freq: Once | INTRAMUSCULAR | Status: AC
  Administered 2023-08-31: 5 mg via INTRAVENOUS
  Filled 2023-08-31: qty 1

## 2023-08-31 NOTE — ED Provider Notes (Signed)
[facility] Provider Note    Event Date/Time   First MD Initiated Contact with Patient 08/31/23 1243     (approximate)   History   Shortness of Breath and Chest Pain   HPI  Cory Rivas is a 57 year old male with history of HTN, DM, CAD, COPD, CHF presenting to the emergency department for evaluation of chest pain.  Patient reports that last night he had onset of chest pain described as a pressure sensation in the center of his chest.  Does report this is similar to multiple recent admissions.  Actually feels improved at the time of my initial evaluation, but reports symptoms have not completely resolved.  Reports he has been compliant with his blood pressure medication.  Does report he has had a cough for the past few days.  No fevers.  Reviewed his discharge summary from 08/20/2023.  At that time, patient presented with chest pressure for 2 days.  Was found to have slightly elevated troponin concerning for hypertensive emergency.  BP improved after treatment in the ER.  He had a negative D-dimer.  He was discharged with improved symptoms.    Physical Exam   Triage Vital Signs: ED Triage Vitals  Encounter Vitals Group     BP 08/31/23 1228 (!) 193/97     Systolic BP Percentile --      Diastolic BP Percentile --      Pulse Rate 08/31/23 1227 69     Resp 08/31/23 1227 18     Temp 08/31/23 1227 98.1 F (36.7 C)     Temp Source 08/31/23 1227 Oral     SpO2 08/31/23 1228 94 %     Weight --      Height --      Head Circumference --      Peak Flow --      Pain Score 08/31/23 1227 6     Pain Loc --      Pain Education --      Exclude from Growth Chart --     Most recent vital signs: Vitals:   08/31/23 1320 08/31/23 1358  BP: (!) 175/90 (!) 176/93  Pulse:  61  Resp:  18  Temp:    SpO2:  98%     General: Awake, interactive  CV:  Regular rate, good peripheral perfusion.  Resp:  Unlabored respirations, lungs clear to  auscultation Abd:  Nondistended, soft, nontender Neuro:  Symmetric facial movement, fluid speech   ED Results / Procedures / Treatments   Labs (all labs ordered are listed, but only abnormal results are displayed) Labs Reviewed  BASIC METABOLIC PANEL - Abnormal; Notable for the following components:      Result Value   Glucose, Bld 257 (*)    Calcium 8.8 (*)    All other components within normal limits  CBC  D-DIMER, QUANTITATIVE  MAGNESIUM  TROPONIN I (HIGH SENSITIVITY)     EKG EKG independently reviewed interpreted by myself (ER attending) demonstrates:  EKG demonstrates normal sinus and the rate of 71, PR 150, QRS 86 QTc 395, no acute ST changes  RADIOLOGY Imaging independently reviewed and interpreted by myself demonstrates:  CXR with possible focal consolidation over the left base  PROCEDURES:  Critical Care performed: No  Procedures   MEDICATIONS ORDERED IN ED: Medications  hydrALAZINE (APRESOLINE) injection 5 mg (5 mg Intravenous Given 08/31/23 1320)     IMPRESSION / MDM / ASSESSMENT AND PLAN / ED COURSE  I reviewed the  triage vital signs and the nursing notes.  Differential diagnosis includes, but is not limited to, hypertensive emergency with cardiac strain, no headache suggestive of intracranial pathology, lower suspicion ACS with recent cath without obstructive CAD, consideration for PE, pneumonia, pneumothorax  Patient's presentation is most consistent with acute presentation with potential threat to life or bodily function.  56 year old male presenting to the emergency department for evaluation of chest pain.  Multiple recent presentations for similar during which patient was found to have mild troponin elevation, the patient with improved pain on presentation here.  Will obtain labs, x-Deiondre Harrower to further evaluate.  Clinical Course as of 08/31/23 1425  Sat Aug 31, 2023  1412 Patient reassessed.  Feels much improved on reevaluation.  Lab work reassuring  including normal troponin with greater than 3 hours of symptoms, negative D-dimer.  Electrolytes without critical derangements.  Hyperglycemic but without evidence of DKA. [NR]    Clinical Course User Index [NR] Trinna Post, MD   Workup here overall reassuring.  Patient with resolved symptoms.  He is comfortable with discharge home.  His x-Lakena Sparlin is concerning for possible pneumonia and he does report a cough for the past few days.  No evidence of sepsis.  With this, will discharge with outpatient antibiotics.  Has a history of COPD, but denies increased wheezing, no evidence of exacerbation.  He will contact his outpatient doctors to discuss further blood pressure management.  Strict return precautions provided.  Patient discharged in stable condition.  FINAL CLINICAL IMPRESSION(S) / ED DIAGNOSES   Final diagnoses:  Atypical chest pain  Hypertensive urgency  Pneumonia of left lower lobe due to infectious organism     Rx / DC Orders   ED Discharge Orders          Ordered    azithromycin (ZITHROMAX) 500 MG tablet  Daily        08/31/23 1425    amoxicillin-clavulanate (AUGMENTIN) 875-125 MG tablet  2 times daily        08/31/23 1425             Note:  This document was prepared using Dragon voice recognition software and may include unintentional dictation errors.   Trinna Post, MD 08/31/23 1425

## 2023-08-31 NOTE — ED Notes (Signed)
Pt taken to XR.  

## 2023-08-31 NOTE — Discharge Instructions (Signed)
Recent in the Emergency Department today for evaluation of your chest pain.  Fortunately your testing was overall reassuring.  Your x-Cory Rivas was concerning for possible developing pneumonia.  I sent a prescription for 2 antibiotics to your pharmacy.  Please take these as directed.  Follow-up with your primary care doctor and outpatient doctors for further management of your blood pressure and reevaluation of your symptoms.  Return to the ER for new or worsening symptoms.

## 2023-08-31 NOTE — ED Triage Notes (Signed)
Pt to ED via POV from home. Pt reports centralized CP and SOB that has been constant since leaving the hospital on 11/6. Pt also endorse light headiness and N/V. Pt denies blood thinner. Pt reports has been BP med compliant.

## 2023-09-02 ENCOUNTER — Telehealth: Payer: Self-pay | Admitting: Cardiology

## 2023-09-02 ENCOUNTER — Other Ambulatory Visit: Payer: Self-pay | Admitting: *Deleted

## 2023-09-02 ENCOUNTER — Telehealth: Payer: Self-pay

## 2023-09-02 ENCOUNTER — Telehealth: Payer: Self-pay | Admitting: *Deleted

## 2023-09-02 DIAGNOSIS — Z860100 Personal history of colon polyps, unspecified: Secondary | ICD-10-CM

## 2023-09-02 MED ORDER — PEG 3350-KCL-NABCB-NACL-NASULF 236 G PO SOLR: 4000.0000 mL | Freq: Once | ORAL | 0 refills | Status: AC

## 2023-09-02 NOTE — Telephone Encounter (Signed)
Colonoscopy schedule with Dr Servando Snare on 09/25/2023

## 2023-09-02 NOTE — Telephone Encounter (Signed)
Gastroenterology Pre-Procedure Review  Request Date: 09/25/2023 Requesting Physician: Dr. Servando Snare  PATIENT REVIEW QUESTIONS: The patient responded to the following health history questions as indicated:    1. Are you having any GI issues? no 2. Do you have a personal history of Polyps? yes (last colonoscopy with Dr Servando Snare on 03/10/2020) 3. Do you have a family history of Colon Cancer or Polyps? no 4. Diabetes Mellitus? yes (taking Jardiance, insulin, metformin, Trulicity) 5. Joint replacements in the past 12 months?no 6. Major health problems in the past 3 months?no 7. Any artificial heart valves, MVP, or defibrillator?yes (CHF)    MEDICATIONS & ALLERGIES:    Patient reports the following regarding taking any anticoagulation/antiplatelet therapy:   Plavix, Coumadin, Eliquis, Xarelto, Lovenox, Pradaxa, Brilinta, or Effient? no Aspirin? yes (81 mg)  Patient confirms/reports the following medications:  Current Outpatient Medications  Medication Sig Dispense Refill   allopurinol (ZYLOPRIM) 300 MG tablet TAKE ONE TABLET BY MOUTH DAILY WITH 100 MG TABLET. FOR A TOTAL OF 400 MG DAILY FOR GOUT 90 tablet 1   amLODipine (NORVASC) 5 MG tablet Take 1 tablet (5 mg total) by mouth daily. 30 tablet 0   amoxicillin-clavulanate (AUGMENTIN) 875-125 MG tablet Take 1 tablet by mouth 2 (two) times daily for 7 days. 14 tablet 0   aspirin EC 81 MG tablet Take 1 tablet (81 mg total) by mouth daily. Swallow whole. 30 tablet 0   azithromycin (ZITHROMAX) 500 MG tablet Take 1 tablet (500 mg total) by mouth daily for 3 days. 3 tablet 0   carvedilol (COREG) 25 MG tablet Take 1 tablet (25 mg total) by mouth 2 (two) times daily. 180 tablet 2   DULoxetine (CYMBALTA) 30 MG capsule Take 60 mg (2 capsules) poqam and 30 mg (1 capsule) poqpm daily 270 capsule 1   empagliflozin (JARDIANCE) 10 MG TABS tablet Take 1 tablet (10 mg total) by mouth daily. 90 tablet 1   ezetimibe (ZETIA) 10 MG tablet TAKE ONE TABLET BY MOUTH DAILY 90  tablet 3   fluticasone (FLONASE) 50 MCG/ACT nasal spray Place 1 spray into both nostrils daily. (Patient taking differently: Place 1 spray into both nostrils daily as needed for rhinitis or allergies.) 18.2 mL 6   insulin glargine (LANTUS SOLOSTAR) 100 UNIT/ML Solostar Pen Inject 20 Units into the skin at bedtime.     insulin lispro (HUMALOG) 100 UNIT/ML KwikPen Inject 1-45 Units into the skin 3 (three) times daily. Sliding scale up to 45 units per injection     ipratropium-albuterol (DUONEB) 0.5-2.5 (3) MG/3ML SOLN Take 3 mLs by nebulization every 6 (six) hours as needed (SOB wheeze cough). 180 mL 1   levocetirizine (XYZAL) 5 MG tablet TAKE ONE TABLET BY MOUTH EVERY EVENING 90 tablet 1   lisinopril (ZESTRIL) 40 MG tablet Take 1 tablet (40 mg total) by mouth daily. 90 tablet 3   metformin (FORTAMET) 500 MG (OSM) 24 hr tablet Take 2,000 mg by mouth at bedtime.     nitroGLYCERIN (NITROSTAT) 0.4 MG SL tablet Place 1 tablet (0.4 mg total) under the tongue every 5 (five) minutes as needed for chest pain. 15 tablet 0   pantoprazole (PROTONIX) 40 MG tablet Take 40 mg by mouth daily.     pregabalin (LYRICA) 50 MG capsule Take 1 capsule (50 mg total) by mouth 2 (two) times daily. 60 capsule 2   rosuvastatin (CRESTOR) 40 MG tablet TAKE ONE TABLET BY MOUTH DAILY 90 tablet 3   spironolactone (ALDACTONE) 25 MG tablet Take 1 tablet (  25 mg total) by mouth daily. 90 tablet 1   Tiotropium Bromide Monohydrate (SPIRIVA RESPIMAT) 2.5 MCG/ACT AERS Inhale 2 puffs into the lungs daily. 30 each 11   Torsemide 40 MG TABS Take 40 mg by mouth daily. Take 1 additional 40 mg tablet on Mon, Wed, and Friday morning. 135 tablet 3   TRULICITY 3 MG/0.5ML SOPN Inject 3 mg into the skin once a week. SATURDAYS     VENTOLIN HFA 108 (90 Base) MCG/ACT inhaler INHALE TWO PUFFS INTO THE LUNGS EVERY SIX HOURS AS NEEDED FOR WHEEZING OR SHORTNESS OF BREATH 18 each 2   No current facility-administered medications for this visit.    Facility-Administered Medications Ordered in Other Visits  Medication Dose Route Frequency Provider Last Rate Last Admin   albuterol (PROVENTIL) (2.5 MG/3ML) 0.083% nebulizer solution 2.5 mg  2.5 mg Nebulization Once Raechel Chute, MD        Patient confirms/reports the following allergies:  Allergies  Allergen Reactions   Oxycodone Nausea And Vomiting   Entresto [Sacubitril-Valsartan] Nausea Only and Other (See Comments)    Lightheaded and dizzy    Onion Nausea And Vomiting   Nitroglycerin Itching and Rash    No orders of the defined types were placed in this encounter.   AUTHORIZATION INFORMATION Primary Insurance: 1D#: Group #:  Secondary Insurance: 1D#: Group #:  SCHEDULE INFORMATION: Date: 09/25/2023 Time: Location:  [facility]

## 2023-09-02 NOTE — Telephone Encounter (Signed)
   Name: Cory Rivas  DOB: Mar 18, 1966  MRN: [id number]  Primary Cardiologist: Debbe Odea, MD   Preoperative team, please contact this patient and set up a phone call appointment for further preoperative risk assessment. Please obtain consent and complete medication review. Thank you for your help.  I confirm that guidance regarding antiplatelet and oral anticoagulation therapy has been completed and, if necessary, noted below.  Can hold ASA 81 mg for 7 days prior to scheduled procedure.  I also confirmed the patient resides in the state of [place]. As per [facility] Medical Board telemedicine laws, the patient must reside in the state in which the provider is licensed.   Napoleon Form, Leodis Rains, NP 09/02/2023, 12:42 PM [facility]

## 2023-09-02 NOTE — Telephone Encounter (Signed)
   Pre-operative Risk Assessment    Patient Name: Cory Rivas  DOB: 03/03/1966 MRN: [id number]      Request for Surgical Clearance    Procedure:   Colonoscopy  2. When is this surgery scheduled? Press F2 to enter date below and place date in Reason for Visit (see directions below). :1} Date of Surgery:  Clearance TBD                                 Surgeon:  not indicated Surgeon's Group or Practice Name:  [facility] Phone number:  [phone] Fax number:  [phone]   Type of Clearance Requested:   - Medical    Type of Anesthesia:  General    Additional requests/questions:    Signed, Narda Amber   09/02/2023, 10:38 AM

## 2023-09-02 NOTE — Telephone Encounter (Signed)
Left message to call back to set up tele pre op appt. See notes from Eula Listen, [facility], pt seen recently in the ED.

## 2023-09-02 NOTE — Telephone Encounter (Signed)
Good Morning Ryan,  We have received a surgical clearance request for Mr. Warf for colonoscopy procedure. They were seen recently in clinic on 08/21/2023. Can you please comment on surgical clearance for his scheduled colonoscopy procedure.  Please forward you guidance and recommendations to P [facility] PREOP   Thanks, Robin Searing, NP

## 2023-09-02 NOTE — Telephone Encounter (Signed)
Patient was doing well and without symptoms of angina or cardiac decompensation at his office visit on 08/21/2023.  He has however been seen in the ED since that visit for chest pain on 08/31/2023.  Given given this, would recommend further evaluation.

## 2023-09-02 NOTE — Telephone Encounter (Signed)
Pt requesting call back to schedule colonoscopy.

## 2023-09-03 ENCOUNTER — Encounter: Payer: Self-pay | Admitting: Family

## 2023-09-03 ENCOUNTER — Ambulatory Visit: Payer: Medicaid Other | Attending: Family | Admitting: Family

## 2023-09-03 VITALS — BP 181/95 | HR 72 | Wt 231.0 lb

## 2023-09-03 DIAGNOSIS — I272 Pulmonary hypertension, unspecified: Secondary | ICD-10-CM | POA: Insufficient documentation

## 2023-09-03 DIAGNOSIS — E785 Hyperlipidemia, unspecified: Secondary | ICD-10-CM | POA: Insufficient documentation

## 2023-09-03 DIAGNOSIS — R42 Dizziness and giddiness: Secondary | ICD-10-CM | POA: Insufficient documentation

## 2023-09-03 DIAGNOSIS — R0602 Shortness of breath: Secondary | ICD-10-CM | POA: Diagnosis not present

## 2023-09-03 DIAGNOSIS — I10 Essential (primary) hypertension: Secondary | ICD-10-CM

## 2023-09-03 DIAGNOSIS — Z7985 Long-term (current) use of injectable non-insulin antidiabetic drugs: Secondary | ICD-10-CM | POA: Insufficient documentation

## 2023-09-03 DIAGNOSIS — J45909 Unspecified asthma, uncomplicated: Secondary | ICD-10-CM | POA: Diagnosis not present

## 2023-09-03 DIAGNOSIS — J440 Chronic obstructive pulmonary disease with acute lower respiratory infection: Secondary | ICD-10-CM | POA: Insufficient documentation

## 2023-09-03 DIAGNOSIS — E1165 Type 2 diabetes mellitus with hyperglycemia: Secondary | ICD-10-CM | POA: Insufficient documentation

## 2023-09-03 DIAGNOSIS — E119 Type 2 diabetes mellitus without complications: Secondary | ICD-10-CM | POA: Diagnosis not present

## 2023-09-03 DIAGNOSIS — I350 Nonrheumatic aortic (valve) stenosis: Secondary | ICD-10-CM | POA: Diagnosis not present

## 2023-09-03 DIAGNOSIS — Z79899 Other long term (current) drug therapy: Secondary | ICD-10-CM | POA: Diagnosis not present

## 2023-09-03 DIAGNOSIS — I509 Heart failure, unspecified: Secondary | ICD-10-CM | POA: Diagnosis not present

## 2023-09-03 DIAGNOSIS — I130 Hypertensive heart and chronic kidney disease with heart failure and stage 1 through stage 4 chronic kidney disease, or unspecified chronic kidney disease: Secondary | ICD-10-CM | POA: Insufficient documentation

## 2023-09-03 DIAGNOSIS — Z7984 Long term (current) use of oral hypoglycemic drugs: Secondary | ICD-10-CM | POA: Diagnosis not present

## 2023-09-03 DIAGNOSIS — E1122 Type 2 diabetes mellitus with diabetic chronic kidney disease: Secondary | ICD-10-CM | POA: Insufficient documentation

## 2023-09-03 DIAGNOSIS — M549 Dorsalgia, unspecified: Secondary | ICD-10-CM | POA: Diagnosis not present

## 2023-09-03 DIAGNOSIS — I428 Other cardiomyopathies: Secondary | ICD-10-CM | POA: Diagnosis not present

## 2023-09-03 DIAGNOSIS — E782 Mixed hyperlipidemia: Secondary | ICD-10-CM

## 2023-09-03 DIAGNOSIS — Z794 Long term (current) use of insulin: Secondary | ICD-10-CM | POA: Insufficient documentation

## 2023-09-03 DIAGNOSIS — N189 Chronic kidney disease, unspecified: Secondary | ICD-10-CM | POA: Diagnosis not present

## 2023-09-03 DIAGNOSIS — I5032 Chronic diastolic (congestive) heart failure: Secondary | ICD-10-CM | POA: Diagnosis not present

## 2023-09-03 DIAGNOSIS — G4733 Obstructive sleep apnea (adult) (pediatric): Secondary | ICD-10-CM | POA: Insufficient documentation

## 2023-09-03 DIAGNOSIS — I2510 Atherosclerotic heart disease of native coronary artery without angina pectoris: Secondary | ICD-10-CM | POA: Diagnosis not present

## 2023-09-03 NOTE — Progress Notes (Signed)
PCP: Danelle Berry, PA-C (last seen 11/24) Primary Cardiologist: Debbe Odea, MD/ Eula Listen, PA (last seen 11/24)  HPI:   Mr Lindner is a 56 y/o male with a history of DM, hyperlipidemia, tracheobronchomalacia, nonobstructive CAD, HTN, gout, COPD, asthma, CKD, dyslipidemia, tobacco use and chronic heart failure.   Admitted 08/23/22 due to dyspnea, productive cough of brown sputum, mild sore throat, fatigue, dizziness and chest soreness. Steroid burst given. Initially given IV lasix with transition to oral diuretics. Lisinopril held due to AKI. Discharged after 2 days. Admitted 11/22/22 due to acute on chronic HF. Weaned off of bipap.Was in the ED 03/01/23 due to neck pain after falling off his tractor after he hit a stump. He landed on his right side.Head CT/ cervical spine xrays were negative. Was in the ED 03/09/23 due to atypical chest pain. Chest CTA negative for PE. Was in the ED 03/22/23 due to nonspecific chest pain. Workup negative. Was in the ED 03/23/23 due to hyperglycemia with glucose of >400.    Admitted 05/27/23 due to acute onset of chest pain, shortness of breath and diaphoresis. Blood pressure was significantly elevated to 204/102 on admission. Troponins were mildly elevated up to 26.  Cardiology was consulted and he underwent cardiac catheterization 8/14 which revealed non-obstructive coronary artery disease. Meds adjusted. Chest x-ray without acute cardiopulmonary process. Echo on 05/28/2023 showed an EF of 50 to 55%, no regional wall motion abnormalities, moderate LVH, grade 3 diastolic dysfunction, elevated left atrial pressure, normal RV systolic function and ventricular cavity size, mildly dilated left atrium, mild mitral regurgitation, aortic valve sclerosis without evidence of stenosis, and an estimated right atrial pressure of 3 mmHg.   Admitted 08/19/23 with chest pain/ pressure for 2 days located in the front chest. Worse with deep breath. Has shortness of breath and cough along with  this. Negative D-dimer. Troponin 23 => 22. 1 dose of IV Lasix as BNP 143.9. Glucose 409. Was in the ED 08/31/23 with central chest pressure along with shortness of breath & cough. Labs reassuring with normal troponin. Negative D-dimer. CXR concerning for pneumonia so antibiotics provided.         Echo 11/04/20: EF 60-65% with Grade I DD, mild LVH/ LAE and normal PA pressure of 31.6 mmHg Echo 08/23/22: EF of 60-65% along with mild LVH & mild MR.  Echo 05/28/23: EF 50-55% with moderate LVH, Grade III DD and mild MR  RHC/LHC 05/29/23: Moderate, nonobstructive coronary artery disease with sequential 50-60% ostial and proximal LAD stenoses that are not hemodynamically significant (RFR = 0.93).  There is also a 40% ostial stenosis of a large first OM branch.  Nondominant RCA is without significant disease. Upper normal to mildly elevated left and right heart filling pressures. Mild pulmonary hypertension. Borderline low Fick cardiac output/index. Mild aortic stenosis.  He presents today for a HF follow-up visit with a chief complaint of moderate shortness of breath with minimal exertion. Chronic in nature. Has associated fatigue, cough (improving), back pain, light-headedness (improving) and intermittent trouble sleeping along with this. Denies chest pain, palpitations, abdominal distention or pedal edema. Was recently diagnosed with pneumonia and just started antibiotics yesterday.   At last visit, carvedilol was increased to 25mg  BID. Checking his BP at home with a wrist cuff but says that SBP has been in the 180's since he's been sick.   Put on his AVS that he needed to schedule pre-op telephone call with cardiology and check on pulmonology appt as there was a note that it  needed to be r/s. He says that he's problems with his phone where it doesn't keep a charge very long.   ROS: All systems negative except as listed in HPI, PMH and Problem List.  SH:  Social History   Socioeconomic History    Marital status: Divorced    Spouse name: Not on file   Number of children: 0   Years of education: 9   Highest education level: 9th grade  Occupational History   Occupation: disability  Tobacco Use   Smoking status: Some Days    Current packs/day: 0.00    Average packs/day: 1 pack/day for 37.0 years (37.0 ttl pk-yrs)    Types: Cigarettes    Start date: 10/19/1989    Last attempt to quit: 08/20/2022    Years since quitting: 1.0   Smokeless tobacco: Never   Tobacco comments:    30+ years hx smoking 2 ppd, stopped smoking briefly in April 2022 for a surgery and then restarted smoking about 1/2 ppd last 4-5 months         5-6cigs daily- 04/10/2023  Vaping Use   Vaping status: Never Used  Substance and Sexual Activity   Alcohol use: No   Drug use: No   Sexual activity: Not Currently    Partners: Female  Other Topics Concern   Not on file  Social History Narrative   Lives with sister [name], brother in law [name]), and his mother - Drako Reiners   Social Determinants of Health   Financial Resource Strain: Medium Risk (12/06/2022)   Overall Financial Resource Strain (CARDIA)    Difficulty of Paying Living Expenses: Somewhat hard  Food Insecurity: No Food Insecurity (08/20/2023)   Hunger Vital Sign    Worried About Running Out of Food in the Last Year: Never true    Ran Out of Food in the Last Year: Never true  Recent Concern: Food Insecurity - Food Insecurity Present (05/28/2023)   Hunger Vital Sign    Worried About Running Out of Food in the Last Year: Often true    Ran Out of Food in the Last Year: Often true  Transportation Needs: No Transportation Needs (08/20/2023)   PRAPARE - Administrator, Civil Service (Medical): No    Lack of Transportation (Non-Medical): No  Physical Activity: Insufficiently Active (12/06/2022)   Exercise Vital Sign    Days of Exercise per Week: 3 days    Minutes of Exercise per Session: 10 min  Stress: No Stress Concern Present (12/06/2022)    [employer] of Occupational Health - Occupational Stress Questionnaire    Feeling of Stress : Only a little  Social Connections: Socially Isolated (12/06/2022)   Social Connection and Isolation Panel [NHANES]    Frequency of Communication with Friends and Family: More than three times a week    Frequency of Social Gatherings with Friends and Family: Twice a week    Attends Religious Services: Never    Database administrator or Organizations: No    Attends Banker Meetings: Never    Marital Status: Divorced  Catering manager Violence: Not At Risk (08/20/2023)   Humiliation, Afraid, Rape, and Kick questionnaire    Fear of Current or Ex-Partner: No    Emotionally Abused: No    Physically Abused: No    Sexually Abused: No    FH:  Family History  Problem Relation Age of Onset   Heart disease Mother    Diabetes Mother    Hyperlipidemia Mother  Hypertension Mother    Heart attack Mother    Lung cancer Father    Hypertension Father    Stroke Father    AAA (abdominal aortic aneurysm) Maternal Grandmother    Heart attack Maternal Grandmother    Diabetes Maternal Grandfather     Past Medical History:  Diagnosis Date   Bulging of cervical intervertebral disc    CHF (congestive heart failure) (HCC)    Diabetes mellitus without complication (HCC)    Gout    High cholesterol    Hypertension    Stress due to family tension 04/15/2019    Current Outpatient Medications  Medication Sig Dispense Refill   allopurinol (ZYLOPRIM) 300 MG tablet TAKE ONE TABLET BY MOUTH DAILY WITH 100 MG TABLET. FOR A TOTAL OF 400 MG DAILY FOR GOUT 90 tablet 1   amLODipine (NORVASC) 5 MG tablet Take 1 tablet (5 mg total) by mouth daily. 30 tablet 0   amoxicillin-clavulanate (AUGMENTIN) 875-125 MG tablet Take 1 tablet by mouth 2 (two) times daily for 7 days. 14 tablet 0   aspirin EC 81 MG tablet Take 1 tablet (81 mg total) by mouth daily. Swallow whole. 30 tablet 0   azithromycin  (ZITHROMAX) 500 MG tablet Take 1 tablet (500 mg total) by mouth daily for 3 days. 3 tablet 0   carvedilol (COREG) 25 MG tablet Take 1 tablet (25 mg total) by mouth 2 (two) times daily. 180 tablet 2   DULoxetine (CYMBALTA) 30 MG capsule Take 60 mg (2 capsules) poqam and 30 mg (1 capsule) poqpm daily 270 capsule 1   empagliflozin (JARDIANCE) 10 MG TABS tablet Take 1 tablet (10 mg total) by mouth daily. 90 tablet 1   ezetimibe (ZETIA) 10 MG tablet TAKE ONE TABLET BY MOUTH DAILY 90 tablet 3   fluticasone (FLONASE) 50 MCG/ACT nasal spray Place 1 spray into both nostrils daily. (Patient taking differently: Place 1 spray into both nostrils daily as needed for rhinitis or allergies.) 18.2 mL 6   insulin glargine (LANTUS SOLOSTAR) 100 UNIT/ML Solostar Pen Inject 20 Units into the skin at bedtime.     insulin lispro (HUMALOG) 100 UNIT/ML KwikPen Inject 1-45 Units into the skin 3 (three) times daily. Sliding scale up to 45 units per injection     ipratropium-albuterol (DUONEB) 0.5-2.5 (3) MG/3ML SOLN Take 3 mLs by nebulization every 6 (six) hours as needed (SOB wheeze cough). 180 mL 1   levocetirizine (XYZAL) 5 MG tablet TAKE ONE TABLET BY MOUTH EVERY EVENING 90 tablet 1   lisinopril (ZESTRIL) 40 MG tablet Take 1 tablet (40 mg total) by mouth daily. 90 tablet 3   metformin (FORTAMET) 500 MG (OSM) 24 hr tablet Take 2,000 mg by mouth at bedtime.     pantoprazole (PROTONIX) 40 MG tablet Take 40 mg by mouth daily.     pregabalin (LYRICA) 50 MG capsule Take 1 capsule (50 mg total) by mouth 2 (two) times daily. 60 capsule 2   rosuvastatin (CRESTOR) 40 MG tablet TAKE ONE TABLET BY MOUTH DAILY 90 tablet 3   Tiotropium Bromide Monohydrate (SPIRIVA RESPIMAT) 2.5 MCG/ACT AERS Inhale 2 puffs into the lungs daily. 30 each 11   Torsemide 40 MG TABS Take 40 mg by mouth daily. Take 1 additional 40 mg tablet on Mon, Wed, and Friday morning. 135 tablet 3   TRULICITY 3 MG/0.5ML SOPN Inject 3 mg into the skin once a week.  SATURDAYS     VENTOLIN HFA 108 (90 Base) MCG/ACT inhaler INHALE TWO PUFFS  INTO THE LUNGS EVERY SIX HOURS AS NEEDED FOR WHEEZING OR SHORTNESS OF BREATH 18 each 2   nitroGLYCERIN (NITROSTAT) 0.4 MG SL tablet Place 1 tablet (0.4 mg total) under the tongue every 5 (five) minutes as needed for chest pain. 15 tablet 0   spironolactone (ALDACTONE) 25 MG tablet Take 1 tablet (25 mg total) by mouth daily. 90 tablet 1   No current facility-administered medications for this visit.   Facility-Administered Medications Ordered in Other Visits  Medication Dose Route Frequency Provider Last Rate Last Admin   albuterol (PROVENTIL) (2.5 MG/3ML) 0.083% nebulizer solution 2.5 mg  2.5 mg Nebulization Once Raechel Chute, MD       Vitals:   09/03/23 0953 09/03/23 0954  BP: (!) 195/94 (!) 181/95  Pulse: 72   SpO2: 97%   Weight: 231 lb (104.8 kg)    Wt Readings from Last 3 Encounters:  09/03/23 231 lb (104.8 kg)  08/28/23 227 lb 11.2 oz (103.3 kg)  08/21/23 223 lb 12.8 oz (101.5 kg)   Lab Results  Component Value Date   CREATININE 1.20 08/31/2023   CREATININE 1.34 (H) 08/20/2023   CREATININE 1.26 (H) 08/19/2023   PHYSICAL EXAM:  General:  Well appearing. No resp difficulty HEENT: negative Neck: supple. JVP flat although difficult to assess due to body habitus. No lymphadenopathy or thryomegaly appreciated. Cor: PMI normal. Regular rate & rhythm. No rubs, gallops. 2/6 systolic murmur at LSB. Lungs: clear Abdomen: soft, nontender, nondistended. No hepatosplenomegaly. No bruits or masses.  Extremities: no cyanosis, clubbing, rash, edema Neuro: alert & oriented x 3, cranial nerves grossly intact. Moves all 4 extremities w/o difficulty. Affect pleasant.   ECG: not done   ASSESSMENT & PLAN:  1: NICM with preserved ejection fraction with LVH- - suscpect due to HTN and OSA - NYHA class III - euvolemic - weighing daily; reminded to call for an overnight weight gain of >2 pounds or a weekly weight  gain of > 5 pounds - weight stable from last visit here 1 month ago - Echo 11/04/20: EF 60-65% with Grade I DD, mild LVH/ LAE and normal PA pressure of 31.6 mmHg - Echo 08/23/22: EF of 60-65% along with mild LVH & mild MR. - Echo 05/28/23: EF 50-55% with moderate LVH, Grade III DD and mild MR - continue carvedilol to 25mg  BID - continue jardiance 10mg  QD - continue torsemide 40mg  daily with additional 40mg  on M, W, F - continue lisinopril 40mg  daily  - continue spironolactone 25mg  daily - drinking less fluids and is trying to keep it closer to 60-64 ounces - not adding salt but continues to eat higher sodium foods  - BNP 08/19/23 was 143.9  2: HTN- - BP 181/95 but he's still recovering from pneumonia - continue checking BP at home & emphasized bringing his pill packs to every visit as I'm not sure our medication list is correct - continue amlodipine 5mg  daily - saw PCP (Mecum) 11/24  - BMP 08/31/23 reviewed and showed sodium 136, potassium 3.9, creatinine 1.20 and GFR >60  3: DM- - A1c 08/09/23 was 9.8% - currently on trulicity, metformin and insulin - saw endocrinology (Rhinehart) 07/24 - 03/21/23 microalbumin/ creat ratio was 1338  4: Reactive airway disease- - saw pulmonology (Dgayli) 06/24 - using spiriva  - continues to not smoke - CPAP was returned as he wasn't wearing it long enough each night  5: Hyperlipidemia- - continue ezetimibe 10mg  daily - continue rosuvastatin 40mg  daily - lipid panel 08/09/23 showed LDL  147, triglycerides 404  6: CAD- - saw cardiology (Dunn) 11/24 - continue ezetimibe 10mg  daily - continue rosuvastatin 40mg  daily - RHC/LHC 05/29/23:   Moderate, nonobstructive coronary artery disease with sequential 50-60% ostial and proximal LAD stenoses that are not hemodynamically significant (RFR = 0.93).  There is also a 40% ostial stenosis of a large first OM branch.  Nondominant RCA is without   significant disease.   Upper normal to mildly elevated left and  right heart filling pressures.   Mild pulmonary hypertension.   Borderline low Fick cardiac output/index.   Mild aortic stenosis.   Return in 1 month, sooner if needed. Emphasized the importance of bringing medication packs to every visit every time.

## 2023-09-03 NOTE — Patient Instructions (Addendum)
Go to pulmonology office on the 1st floor to check on appointment   Go to cardiology office on the lower level to schedule telephone appointment for your colonoscopy

## 2023-09-04 NOTE — Telephone Encounter (Signed)
2nd attempt to reach the pt

## 2023-09-05 NOTE — Telephone Encounter (Signed)
D/w the preop APP today if pt needs tele or if needs in office appt due to notes from Eula Listen, [facility]. Per pre op APP pt will need in office appt now. Will forward back for pre op APP to address needs in office appt and note tele.

## 2023-09-05 NOTE — Telephone Encounter (Signed)
   Name: Cory Rivas  DOB: December 15, 1965  MRN: [id number]  Primary Cardiologist: Debbe Odea, MD  Chart reviewed as part of pre-operative protocol coverage. Because of Cory Rivas's past medical history and recent ED visit, he will require a follow-up in-office visit in order to better assess preoperative cardiovascular risk.  Pre-op covering staff: - Please schedule appointment and call patient to inform them. If patient already had an upcoming appointment within acceptable timeframe, please add "pre-op clearance" to the appointment notes so provider is aware. - Please contact requesting surgeon's office via preferred method (i.e, phone, fax) to inform them of need for appointment prior to surgery.  Patient can hold ASA 81 mg for 7 days prior to procedure.  Rip Harbour, NP  09/05/2023, 5:22 PM

## 2023-09-06 NOTE — Telephone Encounter (Addendum)
3rd attempt to reach pt to schedule IN OFFICE visit. No answer. Lvm will remove from the pool until patient calls back

## 2023-09-07 ENCOUNTER — Other Ambulatory Visit: Payer: Self-pay | Admitting: Family Medicine

## 2023-09-07 DIAGNOSIS — G8929 Other chronic pain: Secondary | ICD-10-CM

## 2023-09-07 DIAGNOSIS — M51369 Other intervertebral disc degeneration, lumbar region without mention of lumbar back pain or lower extremity pain: Secondary | ICD-10-CM

## 2023-09-07 DIAGNOSIS — G959 Disease of spinal cord, unspecified: Secondary | ICD-10-CM

## 2023-09-07 DIAGNOSIS — Z981 Arthrodesis status: Secondary | ICD-10-CM

## 2023-09-09 ENCOUNTER — Ambulatory Visit: Payer: Self-pay | Admitting: *Deleted

## 2023-09-09 NOTE — Telephone Encounter (Signed)
FYI

## 2023-09-09 NOTE — Telephone Encounter (Signed)
Attempted to call patient- no answer- left message to call office Summary: Vomitting Advice   Pt is calling to report right side pain (4/10) towards with coughing and vomitting. Please advise

## 2023-09-09 NOTE — Telephone Encounter (Signed)
  Chief Complaint: Recovering from pneumonia. Pt had some right side chest pain today, and vomited 30-45 minutes after taking medications this morning. Pt states that emesis was orange in color and tasted like orange juice, but has not had orange juice since last week. Morning blood glucose was 210.  Symptoms: above Frequency: ongoing Pertinent Negatives: Patient denies fever. Disposition: [] ED /[] Urgent Care (no appt availability in office) / [x] Appointment(In office/virtual)/ []  [facility]/ [] Home Care/ [] Refused Recommended Disposition /[]  Mobile Bus/ []  Follow-up with PCP Additional Notes: Pt states that he is feeling much better after vomiting, and feels a lot better overall. Pt vomited all of his medication 30-45 minutes after taking this morning. Pt states he has some right side lung pain. Pt continues with cough. Please advise regarding re-taking medications. Among medications taken this morning was final dose of ABX.  Pt unable to monitor BP d/t broken cuff.  Pt will monitor s/s and call back immediatly if needed.  Pt was calling to make hospital follow up appt.  Scheduled appt for tomorrow @ 10:20. Please advise.  Called office and s/w Bjorn Loser  - she will forward encounter tp a provider to review.       Summary: Vomitting Advice   Pt is calling to report right side pain (4/10) towards with coughing and vomitting. Please advise     Answer Assessment - Initial Assessment Questions 1. ONSET: "When did the cough begin?"      Before 11/16 2. SEVERITY: "How bad is the cough today?"      Moderate 6. FEVER: "Do you have a fever?" If Yes, ask: "What is your temperature, how was it measured, and when did it start?"     no 7. CARDIAC HISTORY: "Do you have any history of heart disease?" (e.g., heart attack, congestive heart failure)      Yes - CHF, HTN, CAD 8. LUNG HISTORY: "Do you have any history of lung disease?"  (e.g., pulmonary embolus, asthma, emphysema)      Recent pneumonia. 9. PE RISK FACTORS: "Do you have a history of blood clots?" (or: recent major surgery, recent prolonged travel, bedridden)     no 10. OTHER SYMPTOMS: "Do you have any other symptoms?" (e.g., runny nose, wheezing, chest pain)       Rt side pain - vomited this morning  Protocols used: Cough - Acute Non-Productive-A-AH

## 2023-09-10 ENCOUNTER — Encounter: Payer: Self-pay | Admitting: Nurse Practitioner

## 2023-09-10 ENCOUNTER — Ambulatory Visit: Payer: Medicaid Other | Admitting: Nurse Practitioner

## 2023-09-10 VITALS — BP 124/86 | HR 88 | Temp 98.4°F | Resp 18 | Ht 66.0 in | Wt 231.0 lb

## 2023-09-10 DIAGNOSIS — R197 Diarrhea, unspecified: Secondary | ICD-10-CM

## 2023-09-10 DIAGNOSIS — R1084 Generalized abdominal pain: Secondary | ICD-10-CM | POA: Diagnosis not present

## 2023-09-10 DIAGNOSIS — J189 Pneumonia, unspecified organism: Secondary | ICD-10-CM

## 2023-09-10 DIAGNOSIS — R110 Nausea: Secondary | ICD-10-CM | POA: Diagnosis not present

## 2023-09-10 MED ORDER — ONDANSETRON 4 MG PO TBDP: 4.0000 mg | ORAL_TABLET | Freq: Three times a day (TID) | ORAL | 0 refills | Status: DC | PRN

## 2023-09-10 NOTE — Progress Notes (Signed)
BP 124/86   Pulse 88   Temp 98.4 F (36.9 C)   Resp 18   Ht 5\' 6"  (1.676 m)   Wt 231 lb (104.8 kg)   SpO2 98%   BMI 37.28 kg/m    Subjective:    Patient ID: Cory Rivas, male    DOB: [birth year], 57 y.o.   MRN: [id number]  HPI: Cory Rivas is a 57 y.o. male  Chief Complaint  Patient presents with   Follow-up    ER pneumonia   Back Pain    Upper state vomiting episodes   Discussed the use of AI scribe software for clinical note transcription with the patient, who gave verbal consent to proceed.  History of Present Illness   The patient, with a recent history of pneumonia, presents with body aches and abdominal pain. He reports vomiting and nausea. The patient describes a pain in his side that initially was on the left but has since moved to the right. This pain, described as dull rather than sharp, is particularly severe when he coughs. The patient also reports an upset stomach, likening the sensation to feeling sick. He has experienced diarrhea, which worsens after eating. The patient also reports a cough, which has improved, and mild shortness of breath, which has also improved. He has finished a course of antibiotics for his pneumonia.    Seen in er on 08/31/2023 ER note: 57 year old male presenting to the emergency department for evaluation of chest pain.  Multiple recent presentations for similar during which patient was found to have mild troponin elevation, the patient with improved pain on presentation here.  Will obtain labs, x-ray to further evaluate.      Clinical Course as of 08/31/23 1425  Sat Aug 31, 2023  1412 Patient reassessed.  Feels much improved on reevaluation.  Lab work reassuring including normal troponin with greater than 3 hours of symptoms, negative D-dimer.  Electrolytes without critical derangements.  Hyperglycemic but without evidence of DKA. [NR]     Clinical Course User Index [NR] Trinna Post, MD    Workup here overall reassuring.  Patient  with resolved symptoms.  He is comfortable with discharge home.  His x-ray is concerning for possible pneumonia and he does report a cough for the past few days.  No evidence of sepsis.  With this, will discharge with outpatient antibiotics.  Has a history of COPD, but denies increased wheezing, no evidence of exacerbation.  He will contact his outpatient doctors to discuss further blood pressure management.  Strict return precautions provided.  Patient discharged in stable condition.  Treated with :     azithromycin (ZITHROMAX) 500 MG tablet  Daily        08/31/23 1425      amoxicillin-clavulanate (AUGMENTIN) 875-125 MG tablet  2 times daily           Seen by heart failure clinic on 09/03/2023. Has follow up with pulmonology on 10/01/2023  Relevant past medical, surgical, family and social history reviewed and updated as indicated. Interim medical history since our last visit reviewed. Allergies and medications reviewed and updated.  Review of Systems  Constitutional: Negative for fever or weight change.  Respiratory: positive for cough and shortness of breath.   Cardiovascular: Negative for chest pain or palpitations.  Gastrointestinal: positive for abdominal pain, diarrhea Musculoskeletal: Negative for gait problem or joint swelling.  Skin: Negative for rash.  Neurological: Negative for dizziness or headache.  No other specific complaints in a complete  review of systems (except as listed in HPI above).      Objective:    BP 124/86   Pulse 88   Temp 98.4 F (36.9 C)   Resp 18   Ht 5\' 6"  (1.676 m)   Wt 231 lb (104.8 kg)   SpO2 98%   BMI 37.28 kg/m   Wt Readings from Last 3 Encounters:  09/10/23 231 lb (104.8 kg)  09/03/23 231 lb (104.8 kg)  08/28/23 227 lb 11.2 oz (103.3 kg)    Physical Exam  Constitutional: Patient appears well-developed and well-nourished. Obese  No distress.  HEENT: head atraumatic, normocephalic, pupils equal and reactive to light, neck  supple Cardiovascular: Normal rate, regular rhythm and normal heart sounds.  No murmur heard. No BLE edema. Pulmonary/Chest: Effort normal and breath sounds normal. No respiratory distress. Abdominal: Soft.  There is no tenderness. Psychiatric: Patient has a normal mood and affect. behavior is normal. Judgment and thought content normal.  Results for orders placed or performed during the hospital encounter of 08/31/23  Basic metabolic panel  Result Value Ref Range   Sodium 136 135 - 145 mmol/L   Potassium 3.9 3.5 - 5.1 mmol/L   Chloride 101 98 - 111 mmol/L   CO2 24 22 - 32 mmol/L   Glucose, Bld 257 (H) 70 - 99 mg/dL   BUN 13 6 - 20 mg/dL   Creatinine, Ser 1.61 0.61 - 1.24 mg/dL   Calcium 8.8 (L) 8.9 - 10.3 mg/dL   GFR, Estimated >09 >60 mL/min   Anion gap 11 5 - 15  CBC  Result Value Ref Range   WBC 7.8 4.0 - 10.5 K/uL   RBC 5.50 4.22 - 5.81 MIL/uL   Hemoglobin 16.1 13.0 - 17.0 g/dL   HCT 45.4 09.8 - 11.9 %   MCV 84.0 80.0 - 100.0 fL   MCH 29.3 26.0 - 34.0 pg   MCHC 34.8 30.0 - 36.0 g/dL   RDW 14.7 82.9 - 56.2 %   Platelets 223 150 - 400 K/uL   nRBC 0.0 0.0 - 0.2 %  D-dimer, quantitative  Result Value Ref Range   D-Dimer, Quant 0.27 0.00 - 0.50 ug/mL-FEU  Magnesium  Result Value Ref Range   Magnesium 1.7 1.7 - 2.4 mg/dL  Troponin I (High Sensitivity)  Result Value Ref Range   Troponin I (High Sensitivity) 11 <18 ng/L      Assessment & Plan:   Problem List Items Addressed This Visit       Other   Diarrhea   Relevant Medications   ondansetron (ZOFRAN-ODT) 4 MG disintegrating tablet   Other Relevant Orders   CBC with Differential/Platelet   COMPLETE METABOLIC PANEL WITH GFR   Lipase   Clostridium difficile culture-fecal   Ova and parasite examination   Salmonella/Shigella Cult, Campy EIA and Shiga Toxin reflex   Stool Giardia/Cryptosporidium   Other Visit Diagnoses     Community acquired pneumonia of left lower lobe of lung    -  Primary   Hypertensive  urgency       Atypical chest pain       Generalized abdominal pain       Relevant Medications   ondansetron (ZOFRAN-ODT) 4 MG disintegrating tablet   Other Relevant Orders   CBC with Differential/Platelet   COMPLETE METABOLIC PANEL WITH GFR   Lipase   Nausea       Relevant Medications   ondansetron (ZOFRAN-ODT) 4 MG disintegrating tablet   Other Relevant Orders  CBC with Differential/Platelet   COMPLETE METABOLIC PANEL WITH GFR   Lipase       Assessment and Plan    Pneumonia Recent er visit for pneumonia with ongoing cough and body aches. Patient reports that his symptoms have been improving.  No current fever. Lungs clear on exam. Completed antibiotics. Follow-up with pulmonologist scheduled for 10/01/2023. -Continue current management and follow-up with pulmonologist.  Abdominal Pain and Diarrhea New onset of abdominal pain and diarrhea. Pain is worse with coughing and after eating. No abdominal tenderness on exam. Possible recent exposure to stomach bug. -Order blood work to rule out any underlying issues. -Prescribe Zofran for nausea. -Advise BRAT diet to settle stomach. -stool cultures including cdiff -If symptoms worsen, patient to go to the emergency room.  General Health Maintenance -Advise patient to take it easy during the upcoming holiday.       Continue albuterol inhaler as needed, spiriva daily  Follow up plan: Return if symptoms worsen or fail to improve.

## 2023-09-11 LAB — LIPASE: Lipase: 41 U/L (ref 7–60)

## 2023-09-11 LAB — COMPLETE METABOLIC PANEL WITH GFR
AG Ratio: 1.5 (calc) (ref 1.0–2.5)
ALT: 19 U/L (ref 9–46)
AST: 19 U/L (ref 10–35)
Albumin: 4.4 g/dL (ref 3.6–5.1)
Alkaline phosphatase (APISO): 84 U/L (ref 35–144)
BUN: 14 mg/dL (ref 7–25)
CO2: 27 mmol/L (ref 20–32)
Calcium: 9.1 mg/dL (ref 8.6–10.3)
Chloride: 99 mmol/L (ref 98–110)
Creat: 1.26 mg/dL (ref 0.70–1.30)
Globulin: 2.9 g/dL (ref 1.9–3.7)
Glucose, Bld: 202 mg/dL — ABNORMAL HIGH (ref 65–99)
Potassium: 4.2 mmol/L (ref 3.5–5.3)
Sodium: 137 mmol/L (ref 135–146)
Total Bilirubin: 0.6 mg/dL (ref 0.2–1.2)
Total Protein: 7.3 g/dL (ref 6.1–8.1)
eGFR: 67 mL/min/{1.73_m2} (ref >=60)

## 2023-09-11 LAB — CBC WITH DIFFERENTIAL/PLATELET
Absolute Lymphocytes: 2258 {cells}/uL (ref 850–3900)
Absolute Monocytes: 410 {cells}/uL (ref 200–950)
Basophils Absolute: 42 {cells}/uL (ref 0–200)
Basophils Relative: 0.4 %
Eosinophils Absolute: 158 {cells}/uL (ref 15–500)
Eosinophils Relative: 1.5 %
HCT: 49.9 % (ref 38.5–50.0)
Hemoglobin: 17 g/dL (ref 13.2–17.1)
MCH: 29 pg (ref 27.0–33.0)
MCHC: 34.1 g/dL (ref 32.0–36.0)
MCV: 85 fL (ref 80.0–100.0)
MPV: 10.4 fL (ref 7.5–12.5)
Monocytes Relative: 3.9 %
Neutro Abs: 7634 {cells}/uL (ref 1500–7800)
Neutrophils Relative %: 72.7 %
Platelets: 255 10*3/uL (ref 140–400)
RBC: 5.87 10*6/uL — ABNORMAL HIGH (ref 4.20–5.80)
RDW: 13.3 % (ref 11.0–15.0)
Total Lymphocyte: 21.5 %
WBC: 10.5 10*3/uL (ref 3.8–10.8)

## 2023-09-16 ENCOUNTER — Ambulatory Visit: Payer: Medicaid Other | Admitting: Nurse Practitioner

## 2023-09-16 ENCOUNTER — Encounter: Payer: Self-pay | Admitting: Nurse Practitioner

## 2023-09-16 VITALS — BP 150/90 | HR 76 | Temp 98.0°F | Resp 16 | Ht 66.0 in | Wt 229.1 lb

## 2023-09-16 DIAGNOSIS — R062 Wheezing: Secondary | ICD-10-CM | POA: Diagnosis not present

## 2023-09-16 DIAGNOSIS — J4489 Other specified chronic obstructive pulmonary disease: Secondary | ICD-10-CM | POA: Diagnosis not present

## 2023-09-16 DIAGNOSIS — I10 Essential (primary) hypertension: Secondary | ICD-10-CM | POA: Diagnosis not present

## 2023-09-16 DIAGNOSIS — K219 Gastro-esophageal reflux disease without esophagitis: Secondary | ICD-10-CM

## 2023-09-16 DIAGNOSIS — R0602 Shortness of breath: Secondary | ICD-10-CM | POA: Diagnosis not present

## 2023-09-16 DIAGNOSIS — R197 Diarrhea, unspecified: Secondary | ICD-10-CM | POA: Diagnosis not present

## 2023-09-16 MED ORDER — PANTOPRAZOLE SODIUM 40 MG PO TBEC: 40.0000 mg | DELAYED_RELEASE_TABLET | Freq: Every day | ORAL | 1 refills | Status: DC

## 2023-09-16 MED ORDER — BENZONATATE 100 MG PO CAPS: 200.0000 mg | ORAL_CAPSULE | Freq: Two times a day (BID) | ORAL | 0 refills | Status: DC | PRN

## 2023-09-16 MED ORDER — PREDNISONE 10 MG (21) PO TBPK: ORAL_TABLET | ORAL | 0 refills | Status: DC

## 2023-09-16 MED ORDER — PROMETHAZINE-DM 6.25-15 MG/5ML PO SYRP: 5.0000 mL | ORAL_SOLUTION | Freq: Four times a day (QID) | ORAL | 0 refills | Status: DC | PRN

## 2023-09-16 NOTE — Progress Notes (Signed)
BP (!) 150/90   Pulse 76   Temp 98 F (36.7 C) (Oral)   Resp 16   Ht 5\' 6"  (1.676 m)   Wt 229 lb 1.6 oz (103.9 kg)   SpO2 98%   BMI 36.98 kg/m    Subjective:    Patient ID: Cory Rivas, male    DOB: 09/19/1966, 57 y.o.   MRN: [id number]  HPI: Cory Rivas is a 57 y.o. male  Chief Complaint  Patient presents with   Cough   Shortness of Breath   Nasal Congestion    Note from visit on 09/10/2023  The patient, with a recent history of pneumonia, presents with body aches and abdominal pain. He reports vomiting and nausea. The patient describes a pain in his side that initially was on the left but has since moved to the right. This pain, described as dull rather than sharp, is particularly severe when he coughs. The patient also reports an upset stomach, likening the sensation to feeling sick. He has experienced diarrhea, which worsens after eating. The patient also reports a cough, which has improved, and mild shortness of breath, which has also improved. He has finished a course of antibiotics for his pneumonia.     Seen in er on 08/31/2023 ER note: 57 year old male presenting to the emergency department for evaluation of chest pain.  Multiple recent presentations for similar during which patient was found to have mild troponin elevation, the patient with improved pain on presentation here.  Will obtain labs, x-ray to further evaluate.        Clinical Course as of 08/31/23 1425  Sat Aug 31, 2023  1412 Patient reassessed.  Feels much improved on reevaluation.  Lab work reassuring including normal troponin with greater than 3 hours of symptoms, negative D-dimer.  Electrolytes without critical derangements.  Hyperglycemic but without evidence of DKA. [NR]     Clinical Course User Index [NR] Trinna Post, MD    Workup here overall reassuring.  Patient with resolved symptoms.  He is comfortable with discharge home.  His x-ray is concerning for possible pneumonia and he does report  a cough for the past few days.  No evidence of sepsis.  With this, will discharge with outpatient antibiotics.  Has a history of COPD, but denies increased wheezing, no evidence of exacerbation.  He will contact his outpatient doctors to discuss further blood pressure management.  Strict return precautions provided.  Patient discharged in stable condition.   Treated with :     azithromycin (ZITHROMAX) 500 MG tablet  Daily        08/31/23 1425      amoxicillin-clavulanate (AUGMENTIN) 875-125 MG tablet  2 times daily             Seen by heart failure clinic on 09/03/2023. Has follow up with pulmonology on 10/01/2023  Pneumonia Recent er visit for pneumonia with ongoing cough and body aches. Patient reports that his symptoms have been improving.  No current fever. Lungs clear on exam. Completed antibiotics. Follow-up with pulmonologist scheduled for 10/01/2023. -Continue current management and follow-up with pulmonologist.   Abdominal Pain and Diarrhea New onset of abdominal pain and diarrhea. Pain is worse with coughing and after eating. No abdominal tenderness on exam. Possible recent exposure to stomach bug. -Order blood work to rule out any underlying issues. -Prescribe Zofran for nausea. -Advise BRAT diet to settle stomach. -stool cultures including cdiff -If symptoms worsen, patient to go to the emergency room.  Discussed the use of AI scribe software for clinical note transcription with the patient, who gave verbal consent to proceed.  History of Present Illness   The patient, with a history of pneumonia, presents with persistent cough and difficulty breathing, especially at night. He describes the cough as dry and wheezing, and reports feeling cold and possibly running a low-grade fever. Despite these symptoms, he feels he is slowly improving compared to his condition around Thanksgiving, when he was so fatigued he slept the entire day.  The patient also reports abdominal pain, which  has improved but is still present in the upper abdomen. He suspects this might be due to his persistent cough. He denies any recent diarrhea, which was a problem during his last visit.  The patient also mentions a history of acid reflux, which has been causing him to burp frequently with a nasty taste. He is unsure if he is still taking pantoprazole for this condition.  In addition, the patient has been using an albuterol inhaler and Spiriva for his respiratory symptoms, and has also been using a nebulizer with DuoNeb, which he feels has helped. He reports that his insurance has taken back his CPAP machine, which he feels he needs, as he only sleeps for about two hours at a time due to neck pain and stiffness.    Relevant past medical, surgical, family and social history reviewed and updated as indicated. Interim medical history since our last visit reviewed. Allergies and medications reviewed and updated.  Review of Systems  Ten systems reviewed and is negative except as mentioned in HPI       Objective:    BP (!) 150/90   Pulse 76   Temp 98 F (36.7 C) (Oral)   Resp 16   Ht 5\' 6"  (1.676 m)   Wt 229 lb 1.6 oz (103.9 kg)   SpO2 98%   BMI 36.98 kg/m   Wt Readings from Last 3 Encounters:  09/16/23 229 lb 1.6 oz (103.9 kg)  09/10/23 231 lb (104.8 kg)  09/03/23 231 lb (104.8 kg)    Physical Exam  Constitutional: Patient appears well-developed and well-nourished. Obese  No distress.  HEENT: head atraumatic, normocephalic, pupils equal and reactive to light, neck supple Cardiovascular: Normal rate, regular rhythm and normal heart sounds.  No murmur heard. No BLE edema. Pulmonary/Chest: Effort normal and breath sounds normal. No respiratory distress. Abdominal: Soft.  There is no tenderness. Psychiatric: Patient has a normal mood and affect. behavior is normal. Judgment and thought content normal.  Results for orders placed or performed in visit on 09/10/23  CBC with  Differential/Platelet  Result Value Ref Range   WBC 10.5 3.8 - 10.8 Thousand/uL   RBC 5.87 (H) 4.20 - 5.80 Million/uL   Hemoglobin 17.0 13.2 - 17.1 g/dL   HCT 65.7 84.6 - 96.2 %   MCV 85.0 80.0 - 100.0 fL   MCH 29.0 27.0 - 33.0 pg   MCHC 34.1 32.0 - 36.0 g/dL   RDW 95.2 84.1 - 32.4 %   Platelets 255 140 - 400 Thousand/uL   MPV 10.4 7.5 - 12.5 fL   Neutro Abs 7,634 1,500 - 7,800 cells/uL   Absolute Lymphocytes 2,258 850 - 3,900 cells/uL   Absolute Monocytes 410 200 - 950 cells/uL   Eosinophils Absolute 158 15 - 500 cells/uL   Basophils Absolute 42 0 - 200 cells/uL   Neutrophils Relative % 72.7 %   Total Lymphocyte 21.5 %   Monocytes Relative 3.9 %  Eosinophils Relative 1.5 %   Basophils Relative 0.4 %  COMPLETE METABOLIC PANEL WITH GFR  Result Value Ref Range   Glucose, Bld 202 (H) 65 - 99 mg/dL   BUN 14 7 - 25 mg/dL   Creat 7.82 9.56 - 2.13 mg/dL   eGFR 67 > OR = 60 YQ/MVH/8.46N6   BUN/Creatinine Ratio SEE NOTE: 6 - 22 (calc)   Sodium 137 135 - 146 mmol/L   Potassium 4.2 3.5 - 5.3 mmol/L   Chloride 99 98 - 110 mmol/L   CO2 27 20 - 32 mmol/L   Calcium 9.1 8.6 - 10.3 mg/dL   Total Protein 7.3 6.1 - 8.1 g/dL   Albumin 4.4 3.6 - 5.1 g/dL   Globulin 2.9 1.9 - 3.7 g/dL (calc)   AG Ratio 1.5 1.0 - 2.5 (calc)   Total Bilirubin 0.6 0.2 - 1.2 mg/dL   Alkaline phosphatase (APISO) 84 35 - 144 U/L   AST 19 10 - 35 U/L   ALT 19 9 - 46 U/L  Lipase  Result Value Ref Range   Lipase 41 7 - 60 U/L      Assessment & Plan:   Problem List Items Addressed This Visit       Cardiovascular and Mediastinum   Essential hypertension     Respiratory   COPD with asthma (HCC) - Primary   Relevant Medications   predniSONE (STERAPRED UNI-PAK 21 TAB) 10 MG (21) TBPK tablet   promethazine-dextromethorphan (PROMETHAZINE-DM) 6.25-15 MG/5ML syrup   benzonatate (TESSALON) 100 MG capsule     Digestive   Gastroesophageal reflux disease without esophagitis   Relevant Medications   pantoprazole  (PROTONIX) 40 MG tablet   Other Visit Diagnoses     Shortness of breath       Relevant Medications   predniSONE (STERAPRED UNI-PAK 21 TAB) 10 MG (21) TBPK tablet   promethazine-dextromethorphan (PROMETHAZINE-DM) 6.25-15 MG/5ML syrup   benzonatate (TESSALON) 100 MG capsule   Wheezing       Relevant Medications   predniSONE (STERAPRED UNI-PAK 21 TAB) 10 MG (21) TBPK tablet   promethazine-dextromethorphan (PROMETHAZINE-DM) 6.25-15 MG/5ML syrup   benzonatate (TESSALON) 100 MG capsule       Assessment and Plan    Pneumonia/COPD/asthma Persistent cough and dyspnea despite completion of antibiotics. Improvement noted since Thanksgiving. Chest X-ray on 08/31/2023 showed pneumonia. -Continue Albuterol and Spiriva inhalers. -Start a course of oral steroids. -Continue DuoNeb via nebulizer every 6 hours as needed. -Prescribe two types of cough medicine  Hypertension Elevated blood pressure noted during visit. Patient reported inconsistent medication use. -Advise consistent use of antihypertensive medication. -Check blood pressure after consistent medication use.  Gastroesophageal Reflux Disease (GERD) Reports of burping and unpleasant taste. Uncertain about current use of Pantoprazole. -Refill Pantoprazole prescription.  Follow-up Upcoming appointments with pulmonology  -Advise patient to stay in touch and update on condition in the next few days.        Follow up plan: Return if symptoms worsen or fail to improve.

## 2023-09-20 LAB — GIARDIA AND CRYPTOSPORIDIUM ANTIGEN PANEL
MICRO NUMBER:: 15797539
RESULT:: NOT DETECTED
SPECIMEN QUALITY:: ADEQUATE
Specimen Quality:: ADEQUATE
micro Number:: 15797554

## 2023-09-20 LAB — OVA AND PARASITE EXAMINATION
CONCENTRATE RESULT:: NONE SEEN
MICRO NUMBER:: 15797511
SPECIMEN QUALITY:: ADEQUATE
TRICHROME RESULT:: NONE SEEN

## 2023-09-20 LAB — SALMONELLA/SHIGELLA CULT, CAMPY EIA AND SHIGA TOXIN RFL ECOLI
MICRO NUMBER: 15798913
MICRO NUMBER:: 15798915
MICRO NUMBER:: 15798916
Result:: NOT DETECTED
SHIGA RESULT:: NOT DETECTED
SPECIMEN QUALITY: ADEQUATE
SPECIMEN QUALITY:: ADEQUATE
SPECIMEN QUALITY:: ADEQUATE

## 2023-09-20 LAB — CLOSTRIDIUM DIFFICILE CULTURE-FECAL

## 2023-09-24 ENCOUNTER — Ambulatory Visit: Payer: Medicaid Other | Admitting: Student in an Organized Health Care Education/Training Program

## 2023-09-25 ENCOUNTER — Ambulatory Visit: Payer: Medicaid Other | Admitting: Certified Registered"

## 2023-09-25 ENCOUNTER — Encounter: Payer: Self-pay | Admitting: Gastroenterology

## 2023-09-25 ENCOUNTER — Encounter
Admission: RE | Disposition: A | Discharge status: Home / Self Care | Payer: Self-pay | Source: Home / Self Care | Attending: Gastroenterology

## 2023-09-25 ENCOUNTER — Ambulatory Visit
Admission: RE | Admit: 2023-09-25 | Discharge: 2023-09-25 | Disposition: A | Discharge status: Home / Self Care | Payer: Medicaid Other | Attending: Gastroenterology | Admitting: Gastroenterology

## 2023-09-25 DIAGNOSIS — I10 Essential (primary) hypertension: Secondary | ICD-10-CM | POA: Diagnosis not present

## 2023-09-25 DIAGNOSIS — I509 Heart failure, unspecified: Secondary | ICD-10-CM | POA: Diagnosis not present

## 2023-09-25 DIAGNOSIS — D128 Benign neoplasm of rectum: Secondary | ICD-10-CM | POA: Insufficient documentation

## 2023-09-25 DIAGNOSIS — F17210 Nicotine dependence, cigarettes, uncomplicated: Secondary | ICD-10-CM | POA: Insufficient documentation

## 2023-09-25 DIAGNOSIS — Z7951 Long term (current) use of inhaled steroids: Secondary | ICD-10-CM | POA: Diagnosis not present

## 2023-09-25 DIAGNOSIS — D120 Benign neoplasm of cecum: Secondary | ICD-10-CM | POA: Insufficient documentation

## 2023-09-25 DIAGNOSIS — K5730 Diverticulosis of large intestine without perforation or abscess without bleeding: Secondary | ICD-10-CM | POA: Insufficient documentation

## 2023-09-25 DIAGNOSIS — I110 Hypertensive heart disease with heart failure: Secondary | ICD-10-CM | POA: Diagnosis not present

## 2023-09-25 DIAGNOSIS — J449 Chronic obstructive pulmonary disease, unspecified: Secondary | ICD-10-CM | POA: Diagnosis not present

## 2023-09-25 DIAGNOSIS — Z5986 Financial insecurity: Secondary | ICD-10-CM | POA: Insufficient documentation

## 2023-09-25 DIAGNOSIS — Z09 Encounter for follow-up examination after completed treatment for conditions other than malignant neoplasm: Secondary | ICD-10-CM | POA: Insufficient documentation

## 2023-09-25 DIAGNOSIS — Z794 Long term (current) use of insulin: Secondary | ICD-10-CM | POA: Diagnosis not present

## 2023-09-25 DIAGNOSIS — E1065 Type 1 diabetes mellitus with hyperglycemia: Secondary | ICD-10-CM | POA: Insufficient documentation

## 2023-09-25 DIAGNOSIS — D122 Benign neoplasm of ascending colon: Secondary | ICD-10-CM | POA: Diagnosis not present

## 2023-09-25 DIAGNOSIS — Z860100 Personal history of colon polyps, unspecified: Secondary | ICD-10-CM | POA: Diagnosis not present

## 2023-09-25 DIAGNOSIS — J45909 Unspecified asthma, uncomplicated: Secondary | ICD-10-CM | POA: Diagnosis not present

## 2023-09-25 DIAGNOSIS — Z1211 Encounter for screening for malignant neoplasm of colon: Secondary | ICD-10-CM | POA: Diagnosis not present

## 2023-09-25 DIAGNOSIS — Z7984 Long term (current) use of oral hypoglycemic drugs: Secondary | ICD-10-CM | POA: Insufficient documentation

## 2023-09-25 HISTORY — PX: POLYPECTOMY: SHX5525

## 2023-09-25 HISTORY — PX: COLONOSCOPY WITH PROPOFOL: SHX5780

## 2023-09-25 LAB — GLUCOSE, CAPILLARY: Glucose-Capillary: 198 mg/dL — ABNORMAL HIGH (ref 70–99)

## 2023-09-25 SURGERY — COLONOSCOPY WITH PROPOFOL
Anesthesia: General

## 2023-09-25 MED ORDER — PROPOFOL 10 MG/ML IV BOLUS
INTRAVENOUS | Status: DC | PRN
  Administered 2023-09-25: 80 mg via INTRAVENOUS

## 2023-09-25 MED ORDER — LIDOCAINE HCL (CARDIAC) PF 100 MG/5ML IV SOSY
PREFILLED_SYRINGE | INTRAVENOUS | Status: DC | PRN
  Administered 2023-09-25: 50 mg via INTRAVENOUS

## 2023-09-25 MED ORDER — PROPOFOL 500 MG/50ML IV EMUL
INTRAVENOUS | Status: DC | PRN
  Administered 2023-09-25: 150 ug/kg/min via INTRAVENOUS

## 2023-09-25 MED ORDER — SODIUM CHLORIDE 0.9 % IV SOLN
INTRAVENOUS | Status: DC
  Administered 2023-09-25: 1000 mL via INTRAVENOUS

## 2023-09-25 NOTE — Anesthesia Preprocedure Evaluation (Addendum)
Anesthesia Evaluation  Patient identified by MRN, date of birth, ID band Patient awake    Reviewed: Allergy & Precautions, H&P , NPO status , Patient's Chart, lab work & pertinent test results, reviewed documented beta blocker date and time   Airway Mallampati: II   Neck ROM: full    Dental  (+) Poor Dentition   Pulmonary asthma , COPD,  COPD inhaler, Current Smoker and Patient abstained from smoking.   Pulmonary exam normal        Cardiovascular Exercise Tolerance: Poor hypertension, + angina with exertion + CAD and +CHF  (-) Orthopnea, (-) PND and (-) DOE Normal cardiovascular exam+ Valvular Problems/Murmurs  Rhythm:regular Rate:Normal  Recently evaluated by Etang. Stable. ja   Neuro/Psych  Neuromuscular disease  negative psych ROS   GI/Hepatic Neg liver ROS,GERD  Medicated,,  Endo/Other  diabetes, Poorly Controlled, Type 1, Insulin Dependent    Renal/GU Renal disease  negative genitourinary   Musculoskeletal   Abdominal   Peds  Hematology negative hematology ROS (+)   Anesthesia Other Findings Past Medical History: No date: Bulging of cervical intervertebral disc No date: CHF (congestive heart failure) (HCC) No date: Diabetes mellitus without complication (HCC) No date: Gout No date: High cholesterol No date: Hypertension 04/15/2019: Stress due to family tension Past Surgical History: No date: BACK SURGERY No date: CARDIAC CATHETERIZATION No date: CHOLECYSTECTOMY 03/10/2020: COLONOSCOPY WITH PROPOFOL; N/A     Comment:  Procedure: COLONOSCOPY WITH PROPOFOL;  Surgeon: Midge Minium, MD;  Location: [facility] ENDOS[facility];  Service:               Endoscopy;  Laterality: N/A; 05/29/2023: CORONARY PRESSURE/FFR STUDY; N/A     Comment:  Procedure: CORONARY PRESSURE/FFR STUDY;  Surgeon: Yvonne Kendall, MD;  Location: [facility];                Service: Cardiovascular;  Laterality:  N/A; 05/29/2023: RIGHT/LEFT HEART CATH AND CORONARY ANGIOGRAPHY; N/A     Comment:  Procedure: RIGHT/LEFT HEART CATH AND CORONARY               ANGIOGRAPHY;  Surgeon: Yvonne Kendall, MD;  Location:               [facility];  Service: Cardiovascular;                Laterality: N/A; BMI    Body Mass Index: 33.53 kg/m     Reproductive/Obstetrics negative OB ROS                             Anesthesia Physical Anesthesia Plan  ASA: 3  Anesthesia Plan: General   Post-op Pain Management:    Induction:   PONV Risk Score and Plan:   Airway Management Planned:   Additional Equipment:   Intra-op Plan:   Post-operative Plan:   Informed Consent: I have reviewed the patients History and Physical, chart, labs and discussed the procedure including the risks, benefits and alternatives for the proposed anesthesia with the patient or authorized representative who has indicated his/her understanding and acceptance.     Dental Advisory Given  Plan Discussed with: CRNA  Anesthesia Plan Comments:        Anesthesia Quick Evaluation

## 2023-09-25 NOTE — H&P (Signed)
Cory Minium, Cory Rivas [facility] [address] [place], [place] [address] Phone:[phone] Fax : [phone]  Primary Care Physician:  Danelle Berry, PA-C Primary Gastroenterologist:  Dr. Servando Snare  Pre-Procedure History & Physical: HPI:  Cory Rivas is a 57 y.o. male is here for an colonoscopy.   Past Medical History:  Diagnosis Date   Bulging of cervical intervertebral disc    CHF (congestive heart failure) (HCC)    Diabetes mellitus without complication (HCC)    Gout    High cholesterol    Hypertension    Stress due to family tension 04/15/2019    Past Surgical History:  Procedure Laterality Date   BACK SURGERY     CARDIAC CATHETERIZATION     CHOLECYSTECTOMY     COLONOSCOPY WITH PROPOFOL N/A 03/10/2020   Procedure: COLONOSCOPY WITH PROPOFOL;  Surgeon: Cory Minium, Cory Rivas;  Location: [facility] ENDOSCOPY;  Service: Endoscopy;  Laterality: N/A;   CORONARY PRESSURE/FFR STUDY N/A 05/29/2023   Procedure: CORONARY PRESSURE/FFR STUDY;  Surgeon: Yvonne Kendall, Cory Rivas;  Location: [facility] INVASIVE CV LAB;  Service: Cardiovascular;  Laterality: N/A;   RIGHT/LEFT HEART CATH AND CORONARY ANGIOGRAPHY N/A 05/29/2023   Procedure: RIGHT/LEFT HEART CATH AND CORONARY ANGIOGRAPHY;  Surgeon: Yvonne Kendall, Cory Rivas;  Location: [facility] INVASIVE CV LAB;  Service: Cardiovascular;  Laterality: N/A;    Prior to Admission medications   Medication Sig Start Date End Date Taking? Authorizing Provider  allopurinol (ZYLOPRIM) 300 MG tablet TAKE ONE TABLET BY MOUTH DAILY WITH 100 MG TABLET. FOR A TOTAL OF 400 MG DAILY FOR GOUT 06/21/23  Yes Danelle Berry, PA-C  amLODipine (NORVASC) 5 MG tablet Take 1 tablet (5 mg total) by mouth daily. 08/20/23 08/19/24 Yes Sunnie Nielsen, DO  aspirin EC 81 MG tablet Take 1 tablet (81 mg total) by mouth daily. Swallow whole. 08/21/23  Yes Sunnie Nielsen, DO  carvedilol (COREG) 25 MG tablet Take 1 tablet (25 mg total) by mouth 2 (two) times daily. 08/07/23 11/05/23 Yes Delma Freeze,  FNP  DULoxetine (CYMBALTA) 30 MG capsule Take 60 mg (2 capsules) poqam and 30 mg (1 capsule) poqpm daily 06/24/23  Yes Danelle Berry, PA-C  empagliflozin (JARDIANCE) 10 MG TABS tablet Take 1 tablet (10 mg total) by mouth daily. 06/21/23  Yes Danelle Berry, PA-C  ezetimibe (ZETIA) 10 MG tablet TAKE ONE TABLET BY MOUTH DAILY 01/11/23  Yes Danelle Berry, PA-C  insulin glargine (LANTUS SOLOSTAR) 100 UNIT/ML Solostar Pen Inject 20 Units into the skin at bedtime. 11/01/20  Yes Provider, Historical, Cory Rivas  insulin lispro (HUMALOG) 100 UNIT/ML KwikPen Inject 1-45 Units into the skin 3 (three) times daily. Sliding scale up to 45 units per injection 11/08/20  Yes Provider, Historical, Cory Rivas  lisinopril (ZESTRIL) 40 MG tablet Take 1 tablet (40 mg total) by mouth daily. 02/21/23  Yes Debbe Odea, Cory Rivas  metformin (FORTAMET) 500 MG (OSM) 24 hr tablet Take 2,000 mg by mouth at bedtime.   Yes Provider, Historical, Cory Rivas  pregabalin (LYRICA) 50 MG capsule TAKE ONE CAPSULE (50 MG TOTAL) BY MOUTH TWO TIMES DAILY. 09/09/23  Yes Mecum, Erin E, PA-C  rosuvastatin (CRESTOR) 40 MG tablet TAKE ONE TABLET BY MOUTH DAILY 01/11/23  Yes Danelle Berry, PA-C  benzonatate (TESSALON) 100 MG capsule Take 2 capsules (200 mg total) by mouth 2 (two) times daily as needed for cough. 09/16/23   Berniece Salines, FNP  fluticasone (FLONASE) 50 MCG/ACT nasal spray Place 1 spray into both nostrils daily. Patient taking differently: Place 1 spray into both nostrils daily  as needed for rhinitis or allergies. 01/23/23 01/23/24  Danelle Berry, PA-C  ipratropium-albuterol (DUONEB) 0.5-2.5 (3) MG/3ML SOLN Take 3 mLs by nebulization every 6 (six) hours as needed (SOB wheeze cough). 07/23/22   Danelle Berry, PA-C  levocetirizine (XYZAL) 5 MG tablet TAKE ONE TABLET BY MOUTH EVERY EVENING 01/11/23   Danelle Berry, PA-C  nitroGLYCERIN (NITROSTAT) 0.4 MG SL tablet Place 1 tablet (0.4 mg total) under the tongue every 5 (five) minutes as needed for chest pain. 05/30/23 08/21/23   Leeroy Bock, Cory Rivas  ondansetron (ZOFRAN-ODT) 4 MG disintegrating tablet Take 1 tablet (4 mg total) by mouth every 8 (eight) hours as needed. 09/10/23   Berniece Salines, FNP  pantoprazole (PROTONIX) 40 MG tablet Take 1 tablet (40 mg total) by mouth daily. Patient not taking: Reported on 09/25/2023 09/16/23   Berniece Salines, FNP  predniSONE (STERAPRED UNI-PAK 21 TAB) 10 MG (21) TBPK tablet Take as directed on package.  (60 mg po on day 1, 50 mg po on day 2...) Patient not taking: Reported on 09/25/2023 09/16/23   Berniece Salines, FNP  promethazine-dextromethorphan (PROMETHAZINE-DM) 6.25-15 MG/5ML syrup Take 5 mLs by mouth 4 (four) times daily as needed for cough. 09/16/23   Berniece Salines, FNP  spironolactone (ALDACTONE) 25 MG tablet Take 1 tablet (25 mg total) by mouth daily. 06/04/23 09/02/23  Delma Freeze, FNP  Tiotropium Bromide Monohydrate (SPIRIVA RESPIMAT) 2.5 MCG/ACT AERS Inhale 2 puffs into the lungs daily. 09/24/22   Raechel Chute, Cory Rivas  Torsemide 40 MG TABS Take 40 mg by mouth daily. Take 1 additional 40 mg tablet on Mon, Wed, and Friday morning. 08/21/23 09/20/23  Dunn, Raymon Mutton, PA-C  TRULICITY 3 MG/0.5ML SOPN Inject 3 mg into the skin once a week. SATURDAYS 03/26/22   Provider, Historical, Cory Rivas  VENTOLIN HFA 108 (90 Base) MCG/ACT inhaler INHALE TWO PUFFS INTO THE LUNGS EVERY SIX HOURS AS NEEDED FOR WHEEZING OR SHORTNESS OF BREATH 04/26/23   Danelle Berry, PA-C    Allergies as of 09/02/2023 - Review Complete 08/31/2023  Allergen Reaction Noted   Oxycodone Nausea And Vomiting 03/06/2022   Entresto [sacubitril-valsartan] Nausea Only and Other (See Comments) 03/01/2023   Onion Nausea And Vomiting 08/15/2022   Nitroglycerin Itching and Rash 05/29/2023    Family History  Problem Relation Age of Onset   Heart disease Mother    Diabetes Mother    Hyperlipidemia Mother    Hypertension Mother    Heart attack Mother    Lung cancer Father    Hypertension Father    Stroke Father    AAA  (abdominal aortic aneurysm) Maternal Grandmother    Heart attack Maternal Grandmother    Diabetes Maternal Grandfather     Social History   Socioeconomic History   Marital status: Divorced    Spouse name: Not on file   Number of children: 0   Years of education: 9   Highest education level: 9th grade  Occupational History   Occupation: disability  Tobacco Use   Smoking status: Some Days    Current packs/day: 0.00    Average packs/day: 1 pack/day for 37.0 years (37.0 ttl pk-yrs)    Types: Cigarettes    Start date: 10/19/1989    Last attempt to quit: 08/20/2022    Years since quitting: 1.0   Smokeless tobacco: Never   Tobacco comments:    30+ years hx smoking 2 ppd, stopped smoking briefly in April 2022 for a surgery and then restarted smoking about 1/2  ppd last 4-5 months         5-6cigs daily- 04/10/2023  Vaping Use   Vaping status: Never Used  Substance and Sexual Activity   Alcohol use: No   Drug use: No   Sexual activity: Not Currently    Partners: Female  Other Topics Concern   Not on file  Social History Narrative   Lives with sister [name], brother in law [name]), and his mother - Olton Prejean   Social Determinants of Health   Financial Resource Strain: Medium Risk (12/06/2022)   Overall Financial Resource Strain (CARDIA)    Difficulty of Paying Living Expenses: Somewhat hard  Food Insecurity: No Food Insecurity (08/20/2023)   Hunger Vital Sign    Worried About Running Out of Food in the Last Year: Never true    Ran Out of Food in the Last Year: Never true  Recent Concern: Food Insecurity - Food Insecurity Present (05/28/2023)   Hunger Vital Sign    Worried About Running Out of Food in the Last Year: Often true    Ran Out of Food in the Last Year: Often true  Transportation Needs: No Transportation Needs (08/20/2023)   PRAPARE - Administrator, Civil Service (Medical): No    Lack of Transportation (Non-Medical): No  Physical Activity: Insufficiently  Active (12/06/2022)   Exercise Vital Sign    Days of Exercise per Week: 3 days    Minutes of Exercise per Session: 10 min  Stress: No Stress Concern Present (12/06/2022)   [employer] of Occupational Health - Occupational Stress Questionnaire    Feeling of Stress : Only a little  Social Connections: Socially Isolated (12/06/2022)   Social Connection and Isolation Panel [NHANES]    Frequency of Communication with Friends and Family: More than three times a week    Frequency of Social Gatherings with Friends and Family: Twice a week    Attends Religious Services: Never    Database administrator or Organizations: No    Attends Banker Meetings: Never    Marital Status: Divorced  Catering manager Violence: Not At Risk (08/20/2023)   Humiliation, Afraid, Rape, and Kick questionnaire    Fear of Current or Ex-Partner: No    Emotionally Abused: No    Physically Abused: No    Sexually Abused: No    Review of Systems: See HPI, otherwise negative ROS  Physical Exam: BP (!) 192/93   Pulse 61   Temp (!) 97.1 F (36.2 C) (Temporal)   Resp 18   Ht 5\' 7"  (1.702 m)   Wt 97.1 kg   SpO2 97%   BMI 33.53 kg/m  General:   Alert,  pleasant and cooperative in NAD Head:  Normocephalic and atraumatic. Neck:  Supple; no masses or thyromegaly. Lungs:  Clear throughout to auscultation.    Heart:  Regular rate and rhythm. Abdomen:  Soft, nontender and nondistended. Normal bowel sounds, without guarding, and without rebound.   Neurologic:  Alert and  oriented x4;  grossly normal neurologically.  Impression/Plan: ILIYAN MOULD is here for an colonoscopy to be performed for a history of adenomatous polyps on 2021   Risks, benefits, limitations, and alternatives regarding  colonoscopy have been reviewed with the patient.  Questions have been answered.  All parties agreeable.   Cory Minium, Cory Rivas  09/25/2023, 9:45 AM

## 2023-09-25 NOTE — Anesthesia Postprocedure Evaluation (Signed)
Anesthesia Post Note  Patient: GUNNISON HERSH  Procedure(s) Performed: COLONOSCOPY WITH PROPOFOL POLYPECTOMY  Patient location during evaluation: PACU Anesthesia Type: General Level of consciousness: awake and alert Pain management: pain level controlled Vital Signs Assessment: post-procedure vital signs reviewed and stable Respiratory status: spontaneous breathing, nonlabored ventilation, respiratory function stable and patient connected to nasal cannula oxygen Cardiovascular status: blood pressure returned to baseline and stable Postop Assessment: no apparent nausea or vomiting Anesthetic complications: no   No notable events documented.   Last Vitals:  Vitals:   09/25/23 0936 09/25/23 1015  BP: (!) 192/93 (!) 154/88  Pulse: 61   Resp: 18   Temp: (!) 36.2 C (!) 36.1 C  SpO2: 97%     Last Pain:  Vitals:   09/25/23 1025  TempSrc:   PainSc: 0-No pain                 Yevette Edwards

## 2023-09-25 NOTE — Transfer of Care (Signed)
Immediate Anesthesia Transfer of Care Note  Patient: Cory Rivas  Procedure(s) Performed: COLONOSCOPY WITH PROPOFOL POLYPECTOMY  Patient Location: PACU and Endoscopy Unit  Anesthesia Type:General  Level of Consciousness: awake  Airway & Oxygen Therapy: Patient Spontanous Breathing  Post-op Assessment: Report given to RN and Post -op Vital signs reviewed and stable  Post vital signs: Reviewed and stable  Last Vitals:  Vitals Value Taken Time  BP    Temp    Pulse 76 09/25/23 1014  Resp 23 09/25/23 1014  SpO2 100 % 09/25/23 1014  Vitals shown include unfiled device data.  Last Pain:  Vitals:   09/25/23 0936  TempSrc: Temporal  PainSc: 0-No pain         Complications: No notable events documented.

## 2023-09-25 NOTE — Op Note (Signed)
[facility] Patient Name: Cory Rivas Procedure Date: 09/25/2023 9:45 AM MRN: [id number] Account #: [id number] Date of Birth: [birth year] Admit Type: Outpatient Age: 57 Room: [facility] ENDO ROOM 4 Gender: Male Note Status: Finalized Instrument Name: Prentice Docker [id number] Procedure:             Colonoscopy Indications:           High risk colon cancer surveillance: Personal history                         of colonic polyps Providers:             Midge Minium MD, MD Referring MD:          Danelle Berry (Referring MD) Medicines:             Propofol per Anesthesia Complications:         No immediate complications. Procedure:             Pre-Anesthesia Assessment:                        - Prior to the procedure, a History and Physical was                         performed, and patient medications and allergies were                         reviewed. The patient's tolerance of previous                         anesthesia was also reviewed. The risks and benefits                         of the procedure and the sedation options and risks                         were discussed with the patient. All questions were                         answered, and informed consent was obtained. Prior                         Anticoagulants: The patient has taken no anticoagulant                         or antiplatelet agents. ASA Grade Assessment: II - A                         patient with mild systemic disease. After reviewing                         the risks and benefits, the patient was deemed in                         satisfactory condition to undergo the procedure.                        After obtaining informed consent, the colonoscope was  passed under direct vision. Throughout the procedure,                         the patient's blood pressure, pulse, and oxygen                         saturations were monitored continuously. The                          Colonoscope was introduced through the anus and                         advanced to the the cecum, identified by appendiceal                         orifice and ileocecal valve. The colonoscopy was                         performed without difficulty. The patient tolerated                         the procedure well. The quality of the bowel                         preparation was good. Findings:      The perianal and digital rectal examinations were normal.      A 5 mm polyp was found in the rectum. The polyp was sessile. The polyp       was removed with a cold snare. Resection and retrieval were complete.      A 3 mm polyp was found in the ascending colon. The polyp was sessile.       The polyp was removed with a cold snare. Resection and retrieval were       complete.      An 8 mm polyp was found in the cecum. The polyp was sessile. The polyp       was removed with a cold snare. Resection and retrieval were complete.      A few small-mouthed diverticula were found in the sigmoid colon. Impression:            - One 5 mm polyp in the rectum, removed with a cold                         snare. Resected and retrieved.                        - One 3 mm polyp in the ascending colon, removed with                         a cold snare. Resected and retrieved.                        - One 8 mm polyp in the cecum, removed with a cold                         snare. Resected and retrieved.                        -  Diverticulosis in the sigmoid colon. Recommendation:        - Discharge patient to home.                        - Resume previous diet.                        - Continue present medications.                        - Await pathology results.                        - Repeat colonoscopy in 5 years for surveillance. Procedure Code(s):     --- Professional ---                        [phone], Colonoscopy, flexible; with removal of                         tumor(s), polyp(s), or other  lesion(s) by snare                         technique Diagnosis Code(s):     --- Professional ---                        Z86.010, Personal history of colonic polyps                        D12.2, Benign neoplasm of ascending colon CPT copyright 2022 American Medical Association. All rights reserved. The codes documented in this report are preliminary and upon coder review may  be revised to meet current compliance requirements. Midge Minium MD, MD 09/25/2023 10:13:47 AM This report has been signed electronically. Number of Addenda: 0 Note Initiated On: 09/25/2023 9:45 AM Scope Withdrawal Time: 0 hours 8 minutes 43 seconds  Total Procedure Duration: 0 hours 10 minutes 8 seconds  Estimated Blood Loss:  Estimated blood loss: none.      [facility]

## 2023-09-25 NOTE — Anesthesia Procedure Notes (Signed)
Procedure Name: MAC Date/Time: 09/25/2023 10:01 AM  Performed by: Cheral Bay, CRNAPre-anesthesia Checklist: Patient identified, Emergency Drugs available, Suction available, Patient being monitored and Timeout performed Patient Re-evaluated:Patient Re-evaluated prior to induction Oxygen Delivery Method: Supernova nasal CPAP Induction Type: IV induction Placement Confirmation: positive ETCO2 and CO2 detector

## 2023-09-26 ENCOUNTER — Encounter: Payer: Self-pay | Admitting: Gastroenterology

## 2023-09-26 LAB — SURGICAL PATHOLOGY

## 2023-09-30 ENCOUNTER — Encounter: Payer: Self-pay | Admitting: Gastroenterology

## 2023-10-01 ENCOUNTER — Ambulatory Visit: Payer: Medicaid Other | Admitting: Student in an Organized Health Care Education/Training Program

## 2023-10-02 ENCOUNTER — Telehealth: Payer: Self-pay | Admitting: Family

## 2023-10-02 NOTE — Telephone Encounter (Signed)
Lvm to confirm appt for 10/03/23

## 2023-10-02 NOTE — Progress Notes (Deleted)
PCP: Danelle Berry, PA-C (last seen 12/24) Primary Cardiologist: Debbe Odea, MD/ Eula Listen, PA (last seen 11/24)  HPI:   Cory Rivas is a 57 y/o male with a history of DM, hyperlipidemia, tracheobronchomalacia, nonobstructive CAD, HTN, gout, COPD, asthma, CKD, dyslipidemia, tobacco use and chronic heart failure.   Admitted 08/23/22 due to dyspnea, productive cough of brown sputum, mild sore throat, fatigue, dizziness and chest soreness. Steroid burst given. Initially given IV lasix with transition to oral diuretics. Lisinopril held due to AKI. Discharged after 2 days. Admitted 11/22/22 due to acute on chronic HF. Weaned off of bipap.Was in the ED 03/01/23 due to neck pain after falling off his tractor after he hit a stump. He landed on his right side.Head CT/ cervical spine xrays were negative. Was in the ED 03/09/23 due to atypical chest pain. Chest CTA negative for PE. Was in the ED 03/22/23 due to nonspecific chest pain. Workup negative. Was in the ED 03/23/23 due to hyperglycemia with glucose of >400.    Admitted 05/27/23 due to acute onset of chest pain, shortness of breath and diaphoresis. Blood pressure was significantly elevated to 204/102 on admission. Troponins were mildly elevated up to 26.  Cardiology was consulted and he underwent cardiac catheterization 8/14 which revealed non-obstructive coronary artery disease. Meds adjusted. Chest x-ray without acute cardiopulmonary process. Echo on 05/28/2023 showed an EF of 50 to 55%, no regional wall motion abnormalities, moderate LVH, grade 3 diastolic dysfunction, elevated left atrial pressure, normal RV systolic function and ventricular cavity size, mildly dilated left atrium, mild mitral regurgitation, aortic valve sclerosis without evidence of stenosis, and an estimated right atrial pressure of 3 mmHg.   Admitted 08/19/23 with chest pain/ pressure for 2 days located in the front chest. Worse with deep breath. Has shortness of breath and cough along with  this. Negative D-dimer. Troponin 23 => 22. 1 dose of IV Lasix as BNP 143.9. Glucose 409. Was in the ED 08/31/23 with central chest pressure along with shortness of breath & cough. Labs reassuring with normal troponin. Negative D-dimer. CXR concerning for pneumonia so antibiotics provided.         Echo 11/04/20: EF 60-65% with Grade I DD, mild LVH/ LAE and normal PA pressure of 31.6 mmHg Echo 08/23/22: EF of 60-65% along with mild LVH & mild Cory.  Echo 05/28/23: EF 50-55% with moderate LVH, Grade III DD and mild Cory  RHC/LHC 05/29/23: Moderate, nonobstructive coronary artery disease with sequential 50-60% ostial and proximal LAD stenoses that are not hemodynamically significant (RFR = 0.93).  There is also a 40% ostial stenosis of a large first OM branch.  Nondominant RCA is without significant disease. Upper normal to mildly elevated left and right heart filling pressures. Mild pulmonary hypertension. Borderline low Fick cardiac output/index. Mild aortic stenosis.  He presents today for a HF follow-up visit with a chief complaint of     ROS: All systems negative except as listed in HPI, PMH and Problem List.  SH:  Social History   Socioeconomic History   Marital status: Divorced    Spouse name: Not on file   Number of children: 0   Years of education: 9   Highest education level: 9th grade  Occupational History   Occupation: disability  Tobacco Use   Smoking status: Some Days    Current packs/day: 0.00    Average packs/day: 1 pack/day for 37.0 years (37.0 ttl pk-yrs)    Types: Cigarettes    Start date: 10/19/1989  Last attempt to quit: 08/20/2022    Years since quitting: 1.1   Smokeless tobacco: Never   Tobacco comments:    30+ years hx smoking 2 ppd, stopped smoking briefly in April 2022 for a surgery and then restarted smoking about 1/2 ppd last 4-5 months         5-6cigs daily- 04/10/2023  Vaping Use   Vaping status: Never Used  Substance and Sexual Activity   Alcohol use:  No   Drug use: No   Sexual activity: Not Currently    Partners: Female  Other Topics Concern   Not on file  Social History Narrative   Lives with sister Cory Rivas, brother in law Cory Rivas), and his mother - Cory Rivas   Social Drivers of Health   Financial Resource Strain: Medium Risk (12/06/2022)   Overall Financial Resource Strain (CARDIA)    Difficulty of Paying Living Expenses: Somewhat hard  Food Insecurity: No Food Insecurity (08/20/2023)   Hunger Vital Sign    Worried About Running Out of Food in the Last Year: Never true    Ran Out of Food in the Last Year: Never true  Recent Concern: Food Insecurity - Food Insecurity Present (05/28/2023)   Hunger Vital Sign    Worried About Running Out of Food in the Last Year: Often true    Ran Out of Food in the Last Year: Often true  Transportation Needs: No Transportation Needs (08/20/2023)   PRAPARE - Administrator, Civil Service (Medical): No    Lack of Transportation (Non-Medical): No  Physical Activity: Insufficiently Active (12/06/2022)   Exercise Vital Sign    Days of Exercise per Week: 3 days    Minutes of Exercise per Session: 10 min  Stress: No Stress Concern Present (12/06/2022)   [employer] of Occupational Health - Occupational Stress Questionnaire    Feeling of Stress : Only a little  Social Connections: Socially Isolated (12/06/2022)   Social Connection and Isolation Panel [NHANES]    Frequency of Communication with Friends and Family: More than three times a week    Frequency of Social Gatherings with Friends and Family: Twice a week    Attends Religious Services: Never    Database administrator or Organizations: No    Attends Banker Meetings: Never    Marital Status: Divorced  Catering manager Violence: Not At Risk (08/20/2023)   Humiliation, Afraid, Rape, and Kick questionnaire    Fear of Current or Ex-Partner: No    Emotionally Abused: No    Physically Abused: No    Sexually Abused: No     FH:  Family History  Problem Relation Age of Onset   Heart disease Mother    Diabetes Mother    Hyperlipidemia Mother    Hypertension Mother    Heart attack Mother    Lung cancer Father    Hypertension Father    Stroke Father    AAA (abdominal aortic aneurysm) Maternal Grandmother    Heart attack Maternal Grandmother    Diabetes Maternal Grandfather     Past Medical History:  Diagnosis Date   Bulging of cervical intervertebral disc    CHF (congestive heart failure) (HCC)    Diabetes mellitus without complication (HCC)    Gout    High cholesterol    Hypertension    Stress due to family tension 04/15/2019    Current Outpatient Medications  Medication Sig Dispense Refill   allopurinol (ZYLOPRIM) 300 MG tablet TAKE ONE TABLET BY  MOUTH DAILY WITH 100 MG TABLET. FOR A TOTAL OF 400 MG DAILY FOR GOUT 90 tablet 1   amLODipine (NORVASC) 5 MG tablet Take 1 tablet (5 mg total) by mouth daily. 30 tablet 0   aspirin EC 81 MG tablet Take 1 tablet (81 mg total) by mouth daily. Swallow whole. 30 tablet 0   benzonatate (TESSALON) 100 MG capsule Take 2 capsules (200 mg total) by mouth 2 (two) times daily as needed for cough. 20 capsule 0   carvedilol (COREG) 25 MG tablet Take 1 tablet (25 mg total) by mouth 2 (two) times daily. 180 tablet 2   DULoxetine (CYMBALTA) 30 MG capsule Take 60 mg (2 capsules) poqam and 30 mg (1 capsule) poqpm daily 270 capsule 1   empagliflozin (JARDIANCE) 10 MG TABS tablet Take 1 tablet (10 mg total) by mouth daily. 90 tablet 1   ezetimibe (ZETIA) 10 MG tablet TAKE ONE TABLET BY MOUTH DAILY 90 tablet 3   fluticasone (FLONASE) 50 MCG/ACT nasal spray Place 1 spray into both nostrils daily. (Patient taking differently: Place 1 spray into both nostrils daily as needed for rhinitis or allergies.) 18.2 mL 6   insulin glargine (LANTUS SOLOSTAR) 100 UNIT/ML Solostar Pen Inject 20 Units into the skin at bedtime.     insulin lispro (HUMALOG) 100 UNIT/ML KwikPen Inject 1-45  Units into the skin 3 (three) times daily. Sliding scale up to 45 units per injection     ipratropium-albuterol (DUONEB) 0.5-2.5 (3) MG/3ML SOLN Take 3 mLs by nebulization every 6 (six) hours as needed (SOB wheeze cough). 180 mL 1   levocetirizine (XYZAL) 5 MG tablet TAKE ONE TABLET BY MOUTH EVERY EVENING 90 tablet 1   lisinopril (ZESTRIL) 40 MG tablet Take 1 tablet (40 mg total) by mouth daily. 90 tablet 3   metformin (FORTAMET) 500 MG (OSM) 24 hr tablet Take 2,000 mg by mouth at bedtime.     nitroGLYCERIN (NITROSTAT) 0.4 MG SL tablet Place 1 tablet (0.4 mg total) under the tongue every 5 (five) minutes as needed for chest pain. 15 tablet 0   ondansetron (ZOFRAN-ODT) 4 MG disintegrating tablet Take 1 tablet (4 mg total) by mouth every 8 (eight) hours as needed. 30 tablet 0   pantoprazole (PROTONIX) 40 MG tablet Take 1 tablet (40 mg total) by mouth daily. (Patient not taking: Reported on 09/25/2023) 90 tablet 1   predniSONE (STERAPRED UNI-PAK 21 TAB) 10 MG (21) TBPK tablet Take as directed on package.  (60 mg po on day 1, 50 mg po on day 2...) (Patient not taking: Reported on 09/25/2023) 21 tablet 0   pregabalin (LYRICA) 50 MG capsule TAKE ONE CAPSULE (50 MG TOTAL) BY MOUTH TWO TIMES DAILY. 60 capsule 2   promethazine-dextromethorphan (PROMETHAZINE-DM) 6.25-15 MG/5ML syrup Take 5 mLs by mouth 4 (four) times daily as needed for cough. 118 mL 0   rosuvastatin (CRESTOR) 40 MG tablet TAKE ONE TABLET BY MOUTH DAILY 90 tablet 3   spironolactone (ALDACTONE) 25 MG tablet Take 1 tablet (25 mg total) by mouth daily. 90 tablet 1   Tiotropium Bromide Monohydrate (SPIRIVA RESPIMAT) 2.5 MCG/ACT AERS Inhale 2 puffs into the lungs daily. 30 each 11   Torsemide 40 MG TABS Take 40 mg by mouth daily. Take 1 additional 40 mg tablet on Mon, Wed, and Friday morning. 135 tablet 3   TRULICITY 3 MG/0.5ML SOPN Inject 3 mg into the skin once a week. SATURDAYS     VENTOLIN HFA 108 (90 Base) MCG/ACT inhaler INHALE  TWO PUFFS INTO  THE LUNGS EVERY SIX HOURS AS NEEDED FOR WHEEZING OR SHORTNESS OF BREATH 18 each 2   No current facility-administered medications for this visit.     PHYSICAL EXAM:  General:  Well appearing. No resp difficulty HEENT: negative Neck: supple. JVP flat although difficult to assess due to body habitus. No lymphadenopathy or thryomegaly appreciated. Cor: PMI normal. Regular rate & rhythm. No rubs, gallops. 2/6 systolic murmur at LSB. Lungs: clear Abdomen: soft, nontender, nondistended. No hepatosplenomegaly. No bruits or masses.  Extremities: no cyanosis, clubbing, rash, edema Neuro: alert & oriented x 3, cranial nerves grossly intact. Moves all 4 extremities w/o difficulty. Affect pleasant.   ECG: not done   ASSESSMENT & PLAN:  1: NICM with preserved ejection fraction with LVH- - suscpect due to HTN and OSA - NYHA class III - euvolemic - weighing daily; reminded to call for an overnight weight gain of >2 pounds or a weekly weight gain of > 5 pounds - weight 231 from last visit here 1 month ago - Echo 11/04/20: EF 60-65% with Grade I DD, mild LVH/ LAE and normal PA pressure of 31.6 mmHg - Echo 08/23/22: EF of 60-65% along with mild LVH & mild Cory. - Echo 05/28/23: EF 50-55% with moderate LVH, Grade III DD and mild Cory - continue carvedilol 25mg  BID - continue jardiance 10mg  QD - continue torsemide 40mg  daily with additional 40mg  on M, W, F - continue lisinopril 40mg  daily  - continue spironolactone 25mg  daily - drinking less fluids and is trying to keep it closer to 60-64 ounces - not adding salt but continues to eat higher sodium foods  - BNP 08/19/23 was 143.9  2: HTN- - BP  - continue amlodipine 5mg  daily - saw PCP Zane Herald) 12/24  - BMP 09/10/23 reviewed and showed sodium 137, potassium 4.2, creatinine 1.26 and GFR 67  3: DM- - A1c 08/09/23 was 9.8% - currently on trulicity, metformin and insulin - saw endocrinology (Rhinehart) 07/24 - 03/21/23 microalbumin/ creat ratio was  1338  4: Reactive airway disease- - saw pulmonology (Dgayli) 06/24 - using spiriva  - continues to not smoke - CPAP was returned as he wasn't wearing it long enough each night  5: Hyperlipidemia- - continue ezetimibe 10mg  daily - continue rosuvastatin 40mg  daily - lipid panel 08/09/23 showed LDL 147, triglycerides 404  6: CAD- - saw cardiology (Dunn) 11/24 - continue ezetimibe 10mg  daily - continue rosuvastatin 40mg  daily - RHC/LHC 05/29/23:   Moderate, nonobstructive coronary artery disease with sequential 50-60% ostial and proximal LAD stenoses that are not hemodynamically significant (RFR = 0.93).  There is also a 40% ostial stenosis of a large first OM branch.  Nondominant RCA is without   significant disease.   Upper normal to mildly elevated left and right heart filling pressures.   Mild pulmonary hypertension.   Borderline low Fick cardiac output/index.   Mild aortic stenosis.

## 2023-10-03 ENCOUNTER — Encounter: Payer: Medicaid Other | Admitting: Family

## 2023-10-17 ENCOUNTER — Ambulatory Visit
Admission: RE | Admit: 2023-10-17 | Discharge: 2023-10-17 | Disposition: A | Discharge status: Home / Self Care | Payer: Medicaid Other | Source: Ambulatory Visit | Attending: Family | Admitting: Family

## 2023-10-17 ENCOUNTER — Other Ambulatory Visit
Admission: RE | Admit: 2023-10-17 | Discharge: 2023-10-17 | Disposition: A | Discharge status: Home / Self Care | Payer: Medicaid Other | Source: Ambulatory Visit | Attending: Family | Admitting: Family

## 2023-10-17 ENCOUNTER — Ambulatory Visit (HOSPITAL_BASED_OUTPATIENT_CLINIC_OR_DEPARTMENT_OTHER): Payer: Medicaid Other | Admitting: Family

## 2023-10-17 ENCOUNTER — Encounter: Payer: Self-pay | Admitting: Family

## 2023-10-17 VITALS — BP 195/99 | HR 72 | Wt 232.0 lb

## 2023-10-17 DIAGNOSIS — I10 Essential (primary) hypertension: Secondary | ICD-10-CM | POA: Insufficient documentation

## 2023-10-17 DIAGNOSIS — E119 Type 2 diabetes mellitus without complications: Secondary | ICD-10-CM

## 2023-10-17 DIAGNOSIS — I509 Heart failure, unspecified: Secondary | ICD-10-CM | POA: Diagnosis not present

## 2023-10-17 DIAGNOSIS — Z794 Long term (current) use of insulin: Secondary | ICD-10-CM | POA: Insufficient documentation

## 2023-10-17 DIAGNOSIS — E782 Mixed hyperlipidemia: Secondary | ICD-10-CM | POA: Diagnosis not present

## 2023-10-17 DIAGNOSIS — I2510 Atherosclerotic heart disease of native coronary artery without angina pectoris: Secondary | ICD-10-CM

## 2023-10-17 DIAGNOSIS — I5032 Chronic diastolic (congestive) heart failure: Secondary | ICD-10-CM

## 2023-10-17 DIAGNOSIS — J45909 Unspecified asthma, uncomplicated: Secondary | ICD-10-CM | POA: Insufficient documentation

## 2023-10-17 DIAGNOSIS — R079 Chest pain, unspecified: Secondary | ICD-10-CM | POA: Diagnosis not present

## 2023-10-17 DIAGNOSIS — R0602 Shortness of breath: Secondary | ICD-10-CM | POA: Diagnosis not present

## 2023-10-17 LAB — BASIC METABOLIC PANEL
Anion gap: 12 (ref 5–15)
BUN: 14 mg/dL (ref 6–20)
CO2: 23 mmol/L (ref 22–32)
Calcium: 8.4 mg/dL — ABNORMAL LOW (ref 8.9–10.3)
Chloride: 97 mmol/L — ABNORMAL LOW (ref 98–111)
Creatinine, Ser: 1.33 mg/dL — ABNORMAL HIGH (ref 0.61–1.24)
GFR, Estimated: >60 mL/min (ref >60)
Glucose, Bld: 390 mg/dL — ABNORMAL HIGH (ref 70–99)
Potassium: 4 mmol/L (ref 3.5–5.1)
Sodium: 132 mmol/L — ABNORMAL LOW (ref 135–145)

## 2023-10-17 LAB — BRAIN NATRIURETIC PEPTIDE: B Natriuretic Peptide: 52.4 pg/mL (ref 0.0–100.0)

## 2023-10-17 NOTE — Patient Instructions (Signed)
 Go over to the [facility] TO HAVE YOUR CHEST X-RAY AND LAB WORK COMPLETED.   We will only call you if the results are abnormal or if the provider would like to make medication changes.

## 2023-10-17 NOTE — Progress Notes (Signed)
 PCP: Leavy Mole, PA-C (last seen 12/24) Primary Cardiologist: Redell Cave, MD/ Abigail Motto, PA (last seen 11/24)  Chief Complaint: Shortness of breath  HPI:   Cory Rivas is a 58 y/o male with a history of DM, hyperlipidemia, tracheobronchomalacia, nonobstructive CAD, HTN, gout, COPD, asthma, CKD, dyslipidemia, tobacco use and chronic heart failure.   Admitted 08/23/22 due to dyspnea, productive cough of brown sputum, mild sore throat, fatigue, dizziness and chest soreness. Steroid burst given. Initially given IV lasix  with transition to oral diuretics. Lisinopril  held due to AKI. Discharged after 2 days. Admitted 11/22/22 due to acute on chronic HF. Weaned off of bipap.Was in the ED 03/01/23 due to neck pain after falling off his tractor after he hit a stump. He landed on his right side.Head CT/ cervical spine xrays were negative. Was in the ED 03/09/23 due to atypical chest pain. Chest CTA negative for PE. Was in the ED 03/22/23 due to nonspecific chest pain. Workup negative. Was in the ED 03/23/23 due to hyperglycemia with glucose of >400.    Admitted 05/27/23 due to acute onset of chest pain, shortness of breath and diaphoresis. Blood pressure was significantly elevated to 204/102 on admission. Troponins were mildly elevated up to 26.  Cardiology was consulted and he underwent cardiac catheterization 8/14 which revealed non-obstructive coronary artery disease. Meds adjusted. Chest x-ray without acute cardiopulmonary process. Echo on 05/28/2023 showed an EF of 50 to 55%, no regional wall motion abnormalities, moderate LVH, grade 3 diastolic dysfunction, elevated left atrial pressure, normal RV systolic function and ventricular cavity size, mildly dilated left atrium, mild mitral regurgitation, aortic valve sclerosis without evidence of stenosis, and an estimated right atrial pressure of 3 mmHg.   Admitted 08/19/23 with chest pain/ pressure for 2 days located in the front chest. Worse with deep breath. Has  shortness of breath and cough along with this. Negative D-dimer. Troponin 23 => 22. 1 dose of IV Lasix  as BNP 143.9. Glucose 409. Was in the ED 08/31/23 with central chest pressure along with shortness of breath & cough. Labs reassuring with normal troponin. Negative D-dimer. CXR concerning for pneumonia so antibiotics provided.         Echo 11/04/20: EF 60-65% with Grade I DD, mild LVH/ LAE and normal PA pressure of 31.6 mmHg Echo 08/23/22: EF of 60-65% along with mild LVH & mild Cory.  Echo 05/28/23: EF 50-55% with moderate LVH, Grade III DD and mild Cory  RHC/LHC 05/29/23: Moderate, nonobstructive coronary artery disease with sequential 50-60% ostial and proximal LAD stenoses that are not hemodynamically significant (RFR = 0.93).  There is also a 40% ostial stenosis of a large first OM branch.  Nondominant RCA is without significant disease. Upper normal to mildly elevated left and right heart filling pressures. Mild pulmonary hypertension. Borderline low Fick cardiac output/index. Mild aortic stenosis.  He presents today for a HF follow-up visit with a chief complaint of moderate shortness of breath with minimal exertion. Chronic in nature although he does feel like it has worsened over the last few days. Has associated productive cough, wheezing, fatigue, chest tightness, chronic back/ neck pain and occasional difficulty sleeping along with this. Denies chest pain, palpitations, abdominal distention, pedal edema, dizziness or weight gain. He's unsure if he's pulled a muscle making his back/ neck hurt. He's concerned that maybe his pneumonia hasn't cleared up yet since he's still coughing/ wheezing.    Has been cleaning out his mom's house due to her death and feels wore out from  doing this. Due to all the cleaning/ packing he's been doing, he forgot to take any of his medications last night because he fell asleep. He also hasn't taken any of his medications yet this morning because he woke up late but  will take them all once he gets home.   Has been eating more salt lately such as country ham.   Had a colonoscopy done 09/25/23 with multiple polyps removed. Repeat in 5 years.   ROS: All systems negative except as listed in HPI, PMH and Problem List.  SH:  Social History   Socioeconomic History   Marital status: Divorced    Spouse name: Not on file   Number of children: 0   Years of education: 9   Highest education level: 9th grade  Occupational History   Occupation: disability  Tobacco Use   Smoking status: Some Days    Current packs/day: 0.00    Average packs/day: 1 pack/day for 37.0 years (37.0 ttl pk-yrs)    Types: Cigarettes    Start date: 10/19/1989    Last attempt to quit: 08/20/2022    Years since quitting: 1.1   Smokeless tobacco: Never   Tobacco comments:    30+ years hx smoking 2 ppd, stopped smoking briefly in April 2022 for a surgery and then restarted smoking about 1/2 ppd last 4-5 months         5-6cigs daily- 04/10/2023  Vaping Use   Vaping status: Never Used  Substance and Sexual Activity   Alcohol use: No   Drug use: No   Sexual activity: Not Currently    Partners: Female  Other Topics Concern   Not on file  Social History Narrative   Lives with sister [name], brother in law [name]), and his mother - Kolsen Choe   Social Drivers of Health   Financial Resource Strain: Medium Risk (12/06/2022)   Overall Financial Resource Strain (CARDIA)    Difficulty of Paying Living Expenses: Somewhat hard  Food Insecurity: No Food Insecurity (08/20/2023)   Hunger Vital Sign    Worried About Running Out of Food in the Last Year: Never true    Ran Out of Food in the Last Year: Never true  Recent Concern: Food Insecurity - Food Insecurity Present (05/28/2023)   Hunger Vital Sign    Worried About Running Out of Food in the Last Year: Often true    Ran Out of Food in the Last Year: Often true  Transportation Needs: No Transportation Needs (08/20/2023)   PRAPARE -  Administrator, Civil Service (Medical): No    Lack of Transportation (Non-Medical): No  Physical Activity: Insufficiently Active (12/06/2022)   Exercise Vital Sign    Days of Exercise per Week: 3 days    Minutes of Exercise per Session: 10 min  Stress: No Stress Concern Present (12/06/2022)   [employer] of Occupational Health - Occupational Stress Questionnaire    Feeling of Stress : Only a little  Social Connections: Socially Isolated (12/06/2022)   Social Connection and Isolation Panel [NHANES]    Frequency of Communication with Friends and Family: More than three times a week    Frequency of Social Gatherings with Friends and Family: Twice a week    Attends Religious Services: Never    Database Administrator or Organizations: No    Attends Banker Meetings: Never    Marital Status: Divorced  Catering Manager Violence: Not At Risk (08/20/2023)   Humiliation, Afraid, Rape, and Kick  questionnaire    Fear of Current or Ex-Partner: No    Emotionally Abused: No    Physically Abused: No    Sexually Abused: No    FH:  Family History  Problem Relation Age of Onset   Heart disease Mother    Diabetes Mother    Hyperlipidemia Mother    Hypertension Mother    Heart attack Mother    Lung cancer Father    Hypertension Father    Stroke Father    AAA (abdominal aortic aneurysm) Maternal Grandmother    Heart attack Maternal Grandmother    Diabetes Maternal Grandfather     Past Medical History:  Diagnosis Date   Bulging of cervical intervertebral disc    CHF (congestive heart failure) (HCC)    Diabetes mellitus without complication (HCC)    Gout    High cholesterol    Hypertension    Stress due to family tension 04/15/2019    Current Outpatient Medications  Medication Sig Dispense Refill   allopurinol  (ZYLOPRIM ) 300 MG tablet TAKE ONE TABLET BY MOUTH DAILY WITH 100 MG TABLET. FOR A TOTAL OF 400 MG DAILY FOR GOUT 90 tablet 1   amLODipine  (NORVASC ) 5  MG tablet Take 1 tablet (5 mg total) by mouth daily. 30 tablet 0   aspirin  EC 81 MG tablet Take 1 tablet (81 mg total) by mouth daily. Swallow whole. 30 tablet 0   benzonatate  (TESSALON ) 100 MG capsule Take 2 capsules (200 mg total) by mouth 2 (two) times daily as needed for cough. 20 capsule 0   carvedilol  (COREG ) 25 MG tablet Take 1 tablet (25 mg total) by mouth 2 (two) times daily. 180 tablet 2   DULoxetine  (CYMBALTA ) 30 MG capsule Take 60 mg (2 capsules) poqam and 30 mg (1 capsule) poqpm daily 270 capsule 1   empagliflozin  (JARDIANCE ) 10 MG TABS tablet Take 1 tablet (10 mg total) by mouth daily. 90 tablet 1   ezetimibe  (ZETIA ) 10 MG tablet TAKE ONE TABLET BY MOUTH DAILY 90 tablet 3   fluticasone  (FLONASE ) 50 MCG/ACT nasal spray Place 1 spray into both nostrils daily. (Patient taking differently: Place 1 spray into both nostrils daily as needed for rhinitis or allergies.) 18.2 mL 6   insulin  glargine (LANTUS  SOLOSTAR) 100 UNIT/ML Solostar Pen Inject 20 Units into the skin at bedtime.     insulin  lispro (HUMALOG ) 100 UNIT/ML KwikPen Inject 1-45 Units into the skin 3 (three) times daily. Sliding scale up to 45 units per injection     ipratropium-albuterol  (DUONEB) 0.5-2.5 (3) MG/3ML SOLN Take 3 mLs by nebulization every 6 (six) hours as needed (SOB wheeze cough). 180 mL 1   levocetirizine (XYZAL ) 5 MG tablet TAKE ONE TABLET BY MOUTH EVERY EVENING 90 tablet 1   lisinopril  (ZESTRIL ) 40 MG tablet Take 1 tablet (40 mg total) by mouth daily. 90 tablet 3   metformin  (FORTAMET ) 500 MG (OSM) 24 hr tablet Take 2,000 mg by mouth at bedtime.     nitroGLYCERIN  (NITROSTAT ) 0.4 MG SL tablet Place 1 tablet (0.4 mg total) under the tongue every 5 (five) minutes as needed for chest pain. 15 tablet 0   ondansetron  (ZOFRAN -ODT) 4 MG disintegrating tablet Take 1 tablet (4 mg total) by mouth every 8 (eight) hours as needed. 30 tablet 0   pantoprazole  (PROTONIX ) 40 MG tablet Take 1 tablet (40 mg total) by mouth daily.  (Patient not taking: Reported on 09/25/2023) 90 tablet 1   predniSONE  (STERAPRED UNI-PAK 21 TAB) 10 MG (21)  TBPK tablet Take as directed on package.  (60 mg po on day 1, 50 mg po on day 2...) (Patient not taking: Reported on 09/25/2023) 21 tablet 0   pregabalin  (LYRICA ) 50 MG capsule TAKE ONE CAPSULE (50 MG TOTAL) BY MOUTH TWO TIMES DAILY. 60 capsule 2   promethazine -dextromethorphan (PROMETHAZINE -DM) 6.25-15 MG/5ML syrup Take 5 mLs by mouth 4 (four) times daily as needed for cough. 118 mL 0   rosuvastatin  (CRESTOR ) 40 MG tablet TAKE ONE TABLET BY MOUTH DAILY 90 tablet 3   spironolactone  (ALDACTONE ) 25 MG tablet Take 1 tablet (25 mg total) by mouth daily. 90 tablet 1   Tiotropium Bromide  Monohydrate (SPIRIVA  RESPIMAT) 2.5 MCG/ACT AERS Inhale 2 puffs into the lungs daily. 30 each 11   Torsemide  40 MG TABS Take 40 mg by mouth daily. Take 1 additional 40 mg tablet on Mon, Wed, and Friday morning. 135 tablet 3   TRULICITY  3 MG/0.5ML SOPN Inject 3 mg into the skin once a week. SATURDAYS     VENTOLIN  HFA 108 (90 Base) MCG/ACT inhaler INHALE TWO PUFFS INTO THE LUNGS EVERY SIX HOURS AS NEEDED FOR WHEEZING OR SHORTNESS OF BREATH 18 each 2   No current facility-administered medications for this visit.   Vitals:   10/17/23 0838 10/17/23 0842  BP: (!) 181/102 (!) 195/99  Pulse: 72   SpO2: 95%   Weight: 232 lb (105.2 kg) 232 lb (105.2 kg)   Wt Readings from Last 3 Encounters:  10/17/23 232 lb (105.2 kg)  09/25/23 214 lb 1.1 oz (97.1 kg)  09/16/23 229 lb 1.6 oz (103.9 kg)   Lab Results  Component Value Date   CREATININE 1.26 09/10/2023   CREATININE 1.20 08/31/2023   CREATININE 1.34 (H) 08/20/2023    PHYSICAL EXAM:  General:  Well appearing. No resp difficulty. Uncomfortable looking due to neck pain HEENT: negative Neck: supple. JVP flat although difficult to assess due to body habitus. No lymphadenopathy or thryomegaly appreciated. Cor: PMI normal. Regular rate & rhythm. No rubs, gallops. 2/6  systolic murmur at LSB. Lungs: clear Abdomen: soft, nontender, nondistended. No hepatosplenomegaly. No bruits or masses.  Extremities: no cyanosis, clubbing, rash, trace pitting edema bilateral lower legs Neuro: alert & oriented x 3, cranial nerves grossly intact. Moves all 4 extremities w/o difficulty. Affect pleasant.   ECG: not done  ReDs: 37%   ASSESSMENT & PLAN:  1: NICM with preserved ejection fraction with LVH- - suscpect due to HTN and OSA - NYHA class III - mildly fluid up with worsening symptoms and slightly elevated ReDs reading but hasn't taken meds in >24 hours due to exhaustion of cleaning out his mom's house - weighing daily; reminded to call for an overnight weight gain of >2 pounds or a weekly weight gain of > 5 pounds - weight stable from last visit here 6 weeks ago - ReDs 37% - CXR today for his worsening SOB to make sure his pneumonia has cleared - BMET/ BNP today - Echo 11/04/20: EF 60-65% with Grade I DD, mild LVH/ LAE and normal PA pressure of 31.6 mmHg - Echo 08/23/22: EF of 60-65% along with mild LVH & mild Cory. - Echo 05/28/23: EF 50-55% with moderate LVH, Grade III DD and mild Cory - continue carvedilol  25mg  BID - continue jardiance  10mg  QD - continue torsemide  40mg  daily with additional 40mg  on M, W, F - continue lisinopril  40mg  daily  - continue spironolactone  25mg  daily - drinking less fluids and is trying to keep it closer to  60-64 ounces - has been eating saltier foods such as country ham - BNP 08/19/23 was 143.9  2: HTN- - BP 181/102 with repeat of 195/99 - no meds in >24 hours but he plans to take everything once he gets home - continue amlodipine  5mg  daily - saw PCP Murlean) 12/24  - BMP 09/10/23 reviewed and showed sodium 137, potassium 4.2, creatinine 1.26 and GFR 67 - BMET today  3: DM- - A1c 08/09/23 was 9.8% - currently on trulicity , metformin  and insulin  - saw endocrinology (Rhinehart) 07/24 - 03/21/23 microalbumin/ creat ratio was  1338  4: Reactive airway disease- - saw pulmonology (Dgayli) 06/24 - using spiriva   - continues to not smoke - CPAP was returned as he wasn't wearing it long enough each night  5: Hyperlipidemia- - continue ezetimibe  10mg  daily - continue rosuvastatin  40mg  daily - lipid panel 08/09/23 showed LDL 147, triglycerides 404  6: CAD- - saw cardiology (Dunn) 11/24 - continue ezetimibe  10mg  daily - continue rosuvastatin  40mg  daily - RHC/LHC 05/29/23:   Moderate, nonobstructive coronary artery disease with sequential 50-60% ostial and proximal LAD stenoses that are not hemodynamically significant (RFR = 0.93).  There is also a 40% ostial stenosis of a large first OM branch.  Nondominant RCA is without   significant disease.   Upper normal to mildly elevated left and right heart filling pressures.   Mild pulmonary hypertension.   Borderline low Fick cardiac output/index.   Mild aortic stenosis.  Return next week, sooner if needed.

## 2023-10-21 ENCOUNTER — Other Ambulatory Visit: Payer: Self-pay

## 2023-10-21 ENCOUNTER — Emergency Department: Payer: Medicaid Other

## 2023-10-21 ENCOUNTER — Inpatient Hospital Stay
Admission: EM | Admit: 2023-10-21 | Discharge: 2023-10-24 | DRG: 291 | Disposition: A | Discharge status: Home / Self Care | Payer: Medicaid Other | Attending: Student | Admitting: Student

## 2023-10-21 DIAGNOSIS — Z888 Allergy status to other drugs, medicaments and biological substances status: Secondary | ICD-10-CM

## 2023-10-21 DIAGNOSIS — E1165 Type 2 diabetes mellitus with hyperglycemia: Secondary | ICD-10-CM | POA: Diagnosis not present

## 2023-10-21 DIAGNOSIS — N182 Chronic kidney disease, stage 2 (mild): Secondary | ICD-10-CM | POA: Diagnosis present

## 2023-10-21 DIAGNOSIS — R1110 Vomiting, unspecified: Secondary | ICD-10-CM | POA: Diagnosis not present

## 2023-10-21 DIAGNOSIS — Z8249 Family history of ischemic heart disease and other diseases of the circulatory system: Secondary | ICD-10-CM

## 2023-10-21 DIAGNOSIS — E66812 Obesity, class 2: Secondary | ICD-10-CM | POA: Diagnosis present

## 2023-10-21 DIAGNOSIS — F17210 Nicotine dependence, cigarettes, uncomplicated: Secondary | ICD-10-CM | POA: Diagnosis present

## 2023-10-21 DIAGNOSIS — Z7985 Long-term (current) use of injectable non-insulin antidiabetic drugs: Secondary | ICD-10-CM

## 2023-10-21 DIAGNOSIS — Z7984 Long term (current) use of oral hypoglycemic drugs: Secondary | ICD-10-CM | POA: Diagnosis not present

## 2023-10-21 DIAGNOSIS — J441 Chronic obstructive pulmonary disease with (acute) exacerbation: Secondary | ICD-10-CM

## 2023-10-21 DIAGNOSIS — Z885 Allergy status to narcotic agent status: Secondary | ICD-10-CM | POA: Diagnosis not present

## 2023-10-21 DIAGNOSIS — I5033 Acute on chronic diastolic (congestive) heart failure: Secondary | ICD-10-CM | POA: Diagnosis present

## 2023-10-21 DIAGNOSIS — J984 Other disorders of lung: Secondary | ICD-10-CM | POA: Diagnosis not present

## 2023-10-21 DIAGNOSIS — Z833 Family history of diabetes mellitus: Secondary | ICD-10-CM | POA: Diagnosis not present

## 2023-10-21 DIAGNOSIS — Z79899 Other long term (current) drug therapy: Secondary | ICD-10-CM | POA: Diagnosis not present

## 2023-10-21 DIAGNOSIS — N179 Acute kidney failure, unspecified: Secondary | ICD-10-CM | POA: Diagnosis not present

## 2023-10-21 DIAGNOSIS — Z7982 Long term (current) use of aspirin: Secondary | ICD-10-CM

## 2023-10-21 DIAGNOSIS — Z87891 Personal history of nicotine dependence: Secondary | ICD-10-CM

## 2023-10-21 DIAGNOSIS — Z6836 Body mass index (BMI) 36.0-36.9, adult: Secondary | ICD-10-CM

## 2023-10-21 DIAGNOSIS — E7800 Pure hypercholesterolemia, unspecified: Secondary | ICD-10-CM | POA: Diagnosis not present

## 2023-10-21 DIAGNOSIS — R739 Hyperglycemia, unspecified: Secondary | ICD-10-CM

## 2023-10-21 DIAGNOSIS — Z823 Family history of stroke: Secondary | ICD-10-CM

## 2023-10-21 DIAGNOSIS — E1122 Type 2 diabetes mellitus with diabetic chronic kidney disease: Secondary | ICD-10-CM | POA: Diagnosis not present

## 2023-10-21 DIAGNOSIS — I2510 Atherosclerotic heart disease of native coronary artery without angina pectoris: Secondary | ICD-10-CM | POA: Diagnosis not present

## 2023-10-21 DIAGNOSIS — M109 Gout, unspecified: Secondary | ICD-10-CM | POA: Diagnosis not present

## 2023-10-21 DIAGNOSIS — J9601 Acute respiratory failure with hypoxia: Principal | ICD-10-CM

## 2023-10-21 DIAGNOSIS — Z7951 Long term (current) use of inhaled steroids: Secondary | ICD-10-CM

## 2023-10-21 DIAGNOSIS — Z83438 Family history of other disorder of lipoprotein metabolism and other lipidemia: Secondary | ICD-10-CM | POA: Diagnosis not present

## 2023-10-21 DIAGNOSIS — I130 Hypertensive heart and chronic kidney disease with heart failure and stage 1 through stage 4 chronic kidney disease, or unspecified chronic kidney disease: Principal | ICD-10-CM | POA: Diagnosis present

## 2023-10-21 DIAGNOSIS — Z801 Family history of malignant neoplasm of trachea, bronchus and lung: Secondary | ICD-10-CM

## 2023-10-21 DIAGNOSIS — Z981 Arthrodesis status: Secondary | ICD-10-CM

## 2023-10-21 DIAGNOSIS — I5032 Chronic diastolic (congestive) heart failure: Secondary | ICD-10-CM | POA: Diagnosis present

## 2023-10-21 DIAGNOSIS — Z1152 Encounter for screening for COVID-19: Secondary | ICD-10-CM | POA: Diagnosis not present

## 2023-10-21 DIAGNOSIS — Z91018 Allergy to other foods: Secondary | ICD-10-CM | POA: Diagnosis not present

## 2023-10-21 DIAGNOSIS — Z794 Long term (current) use of insulin: Secondary | ICD-10-CM

## 2023-10-21 DIAGNOSIS — R079 Chest pain, unspecified: Secondary | ICD-10-CM | POA: Diagnosis not present

## 2023-10-21 DIAGNOSIS — R918 Other nonspecific abnormal finding of lung field: Secondary | ICD-10-CM | POA: Diagnosis not present

## 2023-10-21 DIAGNOSIS — I10 Essential (primary) hypertension: Secondary | ICD-10-CM | POA: Diagnosis present

## 2023-10-21 LAB — RESP PANEL BY RT-PCR (RSV, FLU A&B, COVID)  RVPGX2
Influenza A by PCR: NEGATIVE
Influenza B by PCR: NEGATIVE
Resp Syncytial Virus by PCR: NEGATIVE
SARS Coronavirus 2 by RT PCR: NEGATIVE

## 2023-10-21 LAB — GLUCOSE, RANDOM: Glucose, Bld: 385 mg/dL — ABNORMAL HIGH (ref 70–99)

## 2023-10-21 LAB — CBG MONITORING, ED
Glucose-Capillary: 371 mg/dL — ABNORMAL HIGH (ref 70–99)
Glucose-Capillary: 410 mg/dL — ABNORMAL HIGH (ref 70–99)
Glucose-Capillary: 243 mg/dL — ABNORMAL HIGH (ref 70–99)

## 2023-10-21 LAB — CBC
HCT: 49.5 % (ref 39.0–52.0)
Hemoglobin: 16.7 g/dL (ref 13.0–17.0)
MCH: 28.7 pg (ref 26.0–34.0)
MCHC: 33.7 g/dL (ref 30.0–36.0)
MCV: 85.2 fL (ref 80.0–100.0)
Platelets: 222 10*3/uL (ref 150–400)
RBC: 5.81 MIL/uL (ref 4.22–5.81)
RDW: 13 % (ref 11.5–15.5)
WBC: 9 10*3/uL (ref 4.0–10.5)
nRBC: 0 % (ref 0.0–0.2)

## 2023-10-21 LAB — EXPECTORATED SPUTUM ASSESSMENT W GRAM STAIN, RFLX TO RESP C

## 2023-10-21 LAB — BASIC METABOLIC PANEL
Anion gap: 11 (ref 5–15)
BUN: 13 mg/dL (ref 6–20)
CO2: 24 mmol/L (ref 22–32)
Calcium: 9.1 mg/dL (ref 8.9–10.3)
Chloride: 100 mmol/L (ref 98–111)
Creatinine, Ser: 1.27 mg/dL — ABNORMAL HIGH (ref 0.61–1.24)
GFR, Estimated: >60 mL/min (ref >60)
Glucose, Bld: 265 mg/dL — ABNORMAL HIGH (ref 70–99)
Potassium: 3.4 mmol/L — ABNORMAL LOW (ref 3.5–5.1)
Sodium: 135 mmol/L (ref 135–145)

## 2023-10-21 LAB — TROPONIN I (HIGH SENSITIVITY)
Troponin I (High Sensitivity): 21 ng/L — ABNORMAL HIGH (ref <18)
Troponin I (High Sensitivity): 20 ng/L — ABNORMAL HIGH (ref <18)

## 2023-10-21 LAB — D-DIMER, QUANTITATIVE: D-Dimer, Quant: 0.31 ug{FEU}/mL (ref 0.00–0.50)

## 2023-10-21 LAB — BRAIN NATRIURETIC PEPTIDE: B Natriuretic Peptide: 331.6 pg/mL — ABNORMAL HIGH (ref 0.0–100.0)

## 2023-10-21 LAB — HIV ANTIBODY (ROUTINE TESTING W REFLEX): HIV Screen 4th Generation wRfx: NONREACTIVE

## 2023-10-21 MED ORDER — SODIUM CHLORIDE 0.9 % IV SOLN
1.0000 g | Freq: Every day | INTRAVENOUS | Status: DC
  Administered 2023-10-21 – 2023-10-24 (×4): 1 g via INTRAVENOUS
  Filled 2023-10-21 (×4): qty 10

## 2023-10-21 MED ORDER — INSULIN ASPART 100 UNIT/ML IJ SOLN
0.0000 [IU] | Freq: Three times a day (TID) | INTRAMUSCULAR | Status: DC
  Administered 2023-10-21 (×2): 15 [IU] via SUBCUTANEOUS
  Administered 2023-10-22: 3 [IU] via SUBCUTANEOUS
  Administered 2023-10-22 (×2): 8 [IU] via SUBCUTANEOUS
  Administered 2023-10-23: 5 [IU] via SUBCUTANEOUS
  Administered 2023-10-23: 8 [IU] via SUBCUTANEOUS
  Administered 2023-10-23: 3 [IU] via SUBCUTANEOUS
  Administered 2023-10-24: 8 [IU] via SUBCUTANEOUS
  Filled 2023-10-21 (×5): qty 1

## 2023-10-21 MED ORDER — PREGABALIN 50 MG PO CAPS
50.0000 mg | ORAL_CAPSULE | Freq: Once | ORAL | Status: AC
  Administered 2023-10-21: 50 mg via ORAL
  Filled 2023-10-21: qty 1

## 2023-10-21 MED ORDER — SODIUM CHLORIDE 0.9% FLUSH
3.0000 mL | Freq: Two times a day (BID) | INTRAVENOUS | Status: DC
  Administered 2023-10-21 – 2023-10-24 (×6): 3 mL via INTRAVENOUS

## 2023-10-21 MED ORDER — METHYLPREDNISOLONE SODIUM SUCC 125 MG IJ SOLR
125.0000 mg | Freq: Once | INTRAMUSCULAR | Status: AC
  Administered 2023-10-21: 125 mg via INTRAVENOUS
  Filled 2023-10-21: qty 2

## 2023-10-21 MED ORDER — INSULIN ASPART 100 UNIT/ML IJ SOLN
0.0000 [IU] | Freq: Every day | INTRAMUSCULAR | Status: DC
  Administered 2023-10-21 – 2023-10-22 (×2): 2 [IU] via SUBCUTANEOUS
  Filled 2023-10-21 (×2): qty 1

## 2023-10-21 MED ORDER — INSULIN ASPART 100 UNIT/ML IJ SOLN
4.0000 [IU] | Freq: Three times a day (TID) | INTRAMUSCULAR | Status: DC
  Administered 2023-10-21 – 2023-10-24 (×9): 4 [IU] via SUBCUTANEOUS
  Filled 2023-10-21 (×8): qty 1

## 2023-10-21 MED ORDER — SODIUM CHLORIDE 0.9 % IV SOLN
500.0000 mg | Freq: Every day | INTRAVENOUS | Status: AC
  Administered 2023-10-21 – 2023-10-24 (×4): 500 mg via INTRAVENOUS
  Filled 2023-10-21 (×4): qty 5

## 2023-10-21 MED ORDER — MORPHINE SULFATE (PF) 2 MG/ML IV SOLN
2.0000 mg | Freq: Once | INTRAVENOUS | Status: AC
  Administered 2023-10-21: 2 mg via INTRAVENOUS
  Filled 2023-10-21: qty 1

## 2023-10-21 MED ORDER — FUROSEMIDE 10 MG/ML IJ SOLN
60.0000 mg | Freq: Once | INTRAMUSCULAR | Status: AC
  Administered 2023-10-21: 60 mg via INTRAVENOUS
  Filled 2023-10-21: qty 8

## 2023-10-21 MED ORDER — SODIUM CHLORIDE 0.9% FLUSH: 3.0000 mL | INTRAVENOUS | Status: DC | PRN

## 2023-10-21 MED ORDER — ENOXAPARIN SODIUM 60 MG/0.6ML IJ SOSY
0.5000 mg/kg | PREFILLED_SYRINGE | INTRAMUSCULAR | Status: DC
  Administered 2023-10-21 – 2023-10-22 (×2): 52.5 mg via SUBCUTANEOUS
  Filled 2023-10-21 (×2): qty 0.6

## 2023-10-21 MED ORDER — PREDNISONE 20 MG PO TABS
40.0000 mg | ORAL_TABLET | Freq: Every day | ORAL | Status: DC
  Administered 2023-10-22 – 2023-10-24 (×3): 40 mg via ORAL
  Filled 2023-10-21 (×3): qty 2

## 2023-10-21 MED ORDER — FUROSEMIDE 10 MG/ML IJ SOLN: 40.0000 mg | Freq: Once | INTRAMUSCULAR | Status: DC

## 2023-10-21 MED ORDER — ONDANSETRON HCL 4 MG PO TABS
4.0000 mg | ORAL_TABLET | Freq: Four times a day (QID) | ORAL | Status: DC | PRN
  Administered 2023-10-21 – 2023-10-22 (×2): 4 mg via ORAL
  Filled 2023-10-21 (×2): qty 1

## 2023-10-21 MED ORDER — HYDRALAZINE HCL 20 MG/ML IJ SOLN
10.0000 mg | INTRAMUSCULAR | Status: DC | PRN
  Administered 2023-10-21 (×2): 10 mg via INTRAVENOUS
  Filled 2023-10-21 (×3): qty 1

## 2023-10-21 MED ORDER — TORSEMIDE 20 MG PO TABS
40.0000 mg | ORAL_TABLET | Freq: Two times a day (BID) | ORAL | Status: DC
  Administered 2023-10-21 – 2023-10-22 (×3): 40 mg via ORAL
  Filled 2023-10-21 (×3): qty 2

## 2023-10-21 MED ORDER — HYDRALAZINE HCL 50 MG PO TABS
25.0000 mg | ORAL_TABLET | Freq: Once | ORAL | Status: AC
  Administered 2023-10-21: 25 mg via ORAL
  Filled 2023-10-21: qty 1

## 2023-10-21 MED ORDER — INSULIN GLARGINE-YFGN 100 UNIT/ML ~~LOC~~ SOLN
20.0000 [IU] | Freq: Every day | SUBCUTANEOUS | Status: DC
  Administered 2023-10-21 – 2023-10-24 (×4): 20 [IU] via SUBCUTANEOUS
  Filled 2023-10-21 (×4): qty 0.2

## 2023-10-21 MED ORDER — IPRATROPIUM-ALBUTEROL 0.5-2.5 (3) MG/3ML IN SOLN
3.0000 mL | Freq: Once | RESPIRATORY_TRACT | Status: AC
  Administered 2023-10-21: 3 mL via RESPIRATORY_TRACT
  Filled 2023-10-21: qty 3

## 2023-10-21 MED ORDER — SODIUM CHLORIDE 0.9 % IV SOLN: 250.0000 mL | INTRAVENOUS | Status: AC | PRN

## 2023-10-21 MED ORDER — IPRATROPIUM-ALBUTEROL 0.5-2.5 (3) MG/3ML IN SOLN
3.0000 mL | Freq: Four times a day (QID) | RESPIRATORY_TRACT | Status: DC
Start: 2023-10-21 — End: 2023-10-22
  Administered 2023-10-21 – 2023-10-22 (×4): 3 mL via RESPIRATORY_TRACT
  Filled 2023-10-21 (×4): qty 3

## 2023-10-21 MED ORDER — METHYLPREDNISOLONE SODIUM SUCC 40 MG IJ SOLR
40.0000 mg | Freq: Two times a day (BID) | INTRAMUSCULAR | Status: AC
  Administered 2023-10-21 – 2023-10-22 (×2): 40 mg via INTRAVENOUS
  Filled 2023-10-21 (×2): qty 1

## 2023-10-21 MED ORDER — ONDANSETRON HCL 4 MG/2ML IJ SOLN: 4.0000 mg | Freq: Four times a day (QID) | INTRAMUSCULAR | Status: DC | PRN

## 2023-10-21 MED ORDER — ALBUTEROL SULFATE (2.5 MG/3ML) 0.083% IN NEBU: 2.5000 mg | INHALATION_SOLUTION | RESPIRATORY_TRACT | Status: DC | PRN

## 2023-10-21 NOTE — Assessment & Plan Note (Signed)
 Blood sugar in 260s SSI Long-acting insulin Monitor blood sugars with steroid use

## 2023-10-21 NOTE — Assessment & Plan Note (Signed)
 Systolic pressures 180s to 190s Titrate BP regimen with treatment Monitor

## 2023-10-21 NOTE — ED Triage Notes (Signed)
 Pt to ED For chest pain, shob, n/v for a few days. Reports worsening today. 87% on RA, placed on 2 L [place] in triage.

## 2023-10-21 NOTE — Assessment & Plan Note (Signed)
 Continue allopurinol

## 2023-10-21 NOTE — Assessment & Plan Note (Addendum)
 Acute respiratory failure with hypoxia requiring 2 L on presentation Likely multifactorial contributions of body habitus, COPD as well as acute on chronic HFpEF Positive cough wheezing increased sputum production with 1 pack plus per day smoking BNP in the 300s-well above baseline with orthopnea and PND Chest x-ray with?  Streaky opacity at the left base Status post IV Lasix  in the ER Will place on twice daily home 40 mg torsemide  IV solumedrol  Duonebs  IV rocephin  and azithromycin  for broadened resp coverage  D-dimer within normal limits Consider non-contrast CT chest to rule out pneumonia given left base changes on chest x-ray and hypoxia  Strict ins and outs and daily weights Continue supplemental oxygen Discussed tobacco cessation at length Monitor

## 2023-10-21 NOTE — Assessment & Plan Note (Signed)
 Still smoking 1 PPD  Discussed cessation at length

## 2023-10-21 NOTE — Assessment & Plan Note (Addendum)
 Positive cough, wheezing, increased sputum production with overlapping CHF exacerbation and decompensated respiratory failure on 2 L CXR w/ noted streaky density in L bases concerning for ? Atlectasis  IV Solu-Medrol  DuoNebs IV Rocephin  and azithromycin  for expanded resp coverage  Continue supplemental oxygen Discussed smoking cessation at length

## 2023-10-21 NOTE — H&P (Addendum)
 History and Physical    Patient: Cory Rivas FMW:[id number] DOB: September 28, 1966 DOA: 10/21/2023 DOS: the patient was seen and examined on 10/21/2023 PCP: Leavy Mole, PA-C  Patient coming from: Home  Chief Complaint:  Chief Complaint  Patient presents with   Chest Pain   Shortness of Breath   HPI: Cory Rivas is a 58 y.o. male with medical history significant of HTN, HLD, DM, CAD, COPD/asthma, dCHF, gout, CKD-2, tobacco abuse, depression presenting with acute respiratory failure with hypoxia, COPD exacerbation, acute on chronic HFpEF, hyperglycemia.  Patient reports increased work of breathing over the past 2 to 3 days.  Positive mild central chest pressure.  Positive cough, wheezing, increased sputum production.  Positive orthopnea PND.  Still smoking 1 pack/day.  Noted had been admitted November 2024 for issues including hypertensive urgency.  Also had cardiology follow-up January 2.  Patient states he is unclear about his diuretic/medication regimen at present.  Does admit to eating salty foods.  Denies any alcohol use.  No fevers or chills.  No abdominal pain or diarrhea.  No focal hemiparesis or confusion.  Reports recently being treated for pneumonia.  Patient still smoking 1 pack/day despite symptoms. Presented to the ER afebrile, blood pressure 170s to 190s over 80s to 100s.  Satting 87% on room air.  Transition to 2 L nasal cannula to keep O2 sats greater than 91%.  White count 9, hemoglobin 16.7, platelets 222, D-dimer 0.31, COVID flu and RSV negative.  Creatinine 1.27, glucose 265, potassium 3.4.  Troponin 21.  BNP 332.  Chest x-ray with streaky density at the left base compatible atelectasis. Review of Systems: As mentioned in the history of present illness. All other systems reviewed and are negative. Past Medical History:  Diagnosis Date   Bulging of cervical intervertebral disc    CHF (congestive heart failure) (HCC)    Diabetes mellitus without complication (HCC)    Gout     High cholesterol    Hypertension    Stress due to family tension 04/15/2019   Past Surgical History:  Procedure Laterality Date   BACK SURGERY     CARDIAC CATHETERIZATION     CHOLECYSTECTOMY     COLONOSCOPY WITH PROPOFOL  N/A 03/10/2020   Procedure: COLONOSCOPY WITH PROPOFOL ;  Surgeon: Jinny Carmine, MD;  Location: [facility];  Service: Endoscopy;  Laterality: N/A;   COLONOSCOPY WITH PROPOFOL  N/A 09/25/2023   Procedure: COLONOSCOPY WITH PROPOFOL ;  Surgeon: Jinny Carmine, MD;  Location: [facility];  Service: Endoscopy;  Laterality: N/A;   CORONARY PRESSURE/FFR STUDY N/A 05/29/2023   Procedure: CORONARY PRESSURE/FFR STUDY;  Surgeon: Mady Bruckner, MD;  Location: [facility];  Service: Cardiovascular;  Laterality: N/A;   POLYPECTOMY  09/25/2023   Procedure: POLYPECTOMY;  Surgeon: Jinny Carmine, MD;  Location: [facility]OS[facility];  Service: Endoscopy;;   RIGHT/LEFT HEART CATH AND CORONARY ANGIOGRAPHY N/A 05/29/2023   Procedure: RIGHT/LEFT HEART CATH AND CORONARY ANGIOGRAPHY;  Surgeon: Mady Bruckner, MD;  Location: [facility];  Service: Cardiovascular;  Laterality: N/A;   Social History:  reports that he has been smoking cigarettes. He started smoking about 34 years ago. He has a 37 pack-year smoking history. He has never used smokeless tobacco. He reports that he does not drink alcohol and does not use drugs.  Allergies  Allergen Reactions   Oxycodone  Nausea And Vomiting   Entresto  [Sacubitril -Valsartan ] Nausea Only and Other (See Comments)    Lightheaded and dizzy    Onion Nausea And Vomiting   Nitroglycerin  Itching  and Rash    Family History  Problem Relation Age of Onset   Heart disease Mother    Diabetes Mother    Hyperlipidemia Mother    Hypertension Mother    Heart attack Mother    Lung cancer Father    Hypertension Father    Stroke Father    AAA (abdominal aortic aneurysm) Maternal Grandmother    Heart attack Maternal Grandmother    Diabetes  Maternal Grandfather     Prior to Admission medications   Medication Sig Start Date End Date Taking? Authorizing Provider  allopurinol  (ZYLOPRIM ) 300 MG tablet TAKE ONE TABLET BY MOUTH DAILY WITH 100 MG TABLET. FOR A TOTAL OF 400 MG DAILY FOR GOUT 06/21/23   Tapia, Leisa, PA-C  amLODipine  (NORVASC ) 5 MG tablet Take 1 tablet (5 mg total) by mouth daily. 08/20/23 08/19/24  Alexander, Natalie, DO  aspirin  EC 81 MG tablet Take 1 tablet (81 mg total) by mouth daily. Swallow whole. 08/21/23   Alexander, Natalie, DO  benzonatate  (TESSALON ) 100 MG capsule Take 2 capsules (200 mg total) by mouth 2 (two) times daily as needed for cough. 09/16/23   Gareth Mliss FALCON, FNP  carvedilol  (COREG ) 25 MG tablet Take 1 tablet (25 mg total) by mouth 2 (two) times daily. 08/07/23 11/05/23  Donette Ellouise LABOR, FNP  DULoxetine  (CYMBALTA ) 30 MG capsule Take 60 mg (2 capsules) poqam and 30 mg (1 capsule) poqpm daily 06/24/23   Tapia, Leisa, PA-C  empagliflozin  (JARDIANCE ) 10 MG TABS tablet Take 1 tablet (10 mg total) by mouth daily. 06/21/23   Tapia, Leisa, PA-C  ezetimibe  (ZETIA ) 10 MG tablet TAKE ONE TABLET BY MOUTH DAILY 01/11/23   Tapia, Leisa, PA-C  fluticasone  (FLONASE ) 50 MCG/ACT nasal spray Place 1 spray into both nostrils daily. Patient taking differently: Place 1 spray into both nostrils daily as needed for rhinitis or allergies. 01/23/23 01/23/24  Tapia, Leisa, PA-C  insulin  glargine (LANTUS  SOLOSTAR) 100 UNIT/ML Solostar Pen Inject 20 Units into the skin at bedtime. 11/01/20   [provider]  insulin  lispro (HUMALOG ) 100 UNIT/ML KwikPen Inject 1-45 Units into the skin 3 (three) times daily. Sliding scale up to 45 units per injection 11/08/20   [provider]  ipratropium-albuterol  (DUONEB) 0.5-2.5 (3) MG/3ML SOLN Take 3 mLs by nebulization every 6 (six) hours as needed (SOB wheeze cough). 07/23/22   Tapia, Leisa, PA-C  levocetirizine (XYZAL ) 5 MG tablet TAKE ONE TABLET BY MOUTH EVERY EVENING 01/11/23   Tapia,  Leisa, PA-C  lisinopril  (ZESTRIL ) 40 MG tablet Take 1 tablet (40 mg total) by mouth daily. 02/21/23   Darliss Rogue, MD  metformin  (FORTAMET ) 500 MG (OSM) 24 hr tablet Take 2,000 mg by mouth at bedtime.    [provider]  nitroGLYCERIN  (NITROSTAT ) 0.4 MG SL tablet Place 1 tablet (0.4 mg total) under the tongue every 5 (five) minutes as needed for chest pain. 05/30/23   Lenon Marien CROME, MD  ondansetron  (ZOFRAN -ODT) 4 MG disintegrating tablet Take 1 tablet (4 mg total) by mouth every 8 (eight) hours as needed. 09/10/23   Gareth Mliss FALCON, FNP  pantoprazole  (PROTONIX ) 40 MG tablet Take 1 tablet (40 mg total) by mouth daily. 09/16/23   Pender, Julie F, FNP  pregabalin  (LYRICA ) 50 MG capsule TAKE ONE CAPSULE (50 MG TOTAL) BY MOUTH TWO TIMES DAILY. 09/09/23   Mecum, Erin E, PA-C  promethazine -dextromethorphan (PROMETHAZINE -DM) 6.25-15 MG/5ML syrup Take 5 mLs by mouth 4 (four) times daily as needed for cough. 09/16/23   Gareth,  Julie F, FNP  rosuvastatin  (CRESTOR ) 40 MG tablet TAKE ONE TABLET BY MOUTH DAILY 01/11/23   Tapia, Leisa, PA-C  spironolactone  (ALDACTONE ) 25 MG tablet Take 1 tablet (25 mg total) by mouth daily. 06/04/23   Donette Ellouise LABOR, FNP  Tiotropium Bromide  Monohydrate (SPIRIVA  RESPIMAT) 2.5 MCG/ACT AERS Inhale 2 puffs into the lungs daily. 09/24/22   Isadora Hose, MD  Torsemide  40 MG TABS Take 40 mg by mouth daily. Take 1 additional 40 mg tablet on Mon, Wed, and Friday morning. 08/21/23   Dunn, Bernardino HERO, PA-C  TRULICITY  3 MG/0.5ML SOPN Inject 3 mg into the skin once a week. SATURDAYS 03/26/22   [provider]  VENTOLIN  HFA 108 (90 Base) MCG/ACT inhaler INHALE TWO PUFFS INTO THE LUNGS EVERY SIX HOURS AS NEEDED FOR WHEEZING OR SHORTNESS OF BREATH 04/26/23   Leavy Mole, PA-C    Physical Exam: Vitals:   10/21/23 0738 10/21/23 0900 10/21/23 0930 10/21/23 1001  BP:  (!) 176/93 (!) 171/83 (!) 184/84  Pulse:  72 65   Resp:      Temp:      SpO2: 91% 94% 93%   Weight:       Height:       Physical Exam Constitutional:      Appearance: He is obese.  HENT:     Head: Normocephalic and atraumatic.     Nose: Nose normal.  Eyes:     Pupils: Pupils are equal, round, and reactive to light.  Cardiovascular:     Rate and Rhythm: Normal rate and regular rhythm.  Pulmonary:     Effort: Pulmonary effort is normal.     Breath sounds: Wheezing present.  Abdominal:     General: Bowel sounds are normal.     Comments: Obese abdomen    Musculoskeletal:        General: Normal range of motion.  Skin:    General: Skin is warm.  Neurological:     General: No focal deficit present.  Psychiatric:        Mood and Affect: Mood normal.     Data Reviewed:  There are no new results to review at this time.   DG Chest 2 View CLINICAL DATA:  Congestive heart failure. Shortness of breath. Chest pain  EXAM: CHEST - 2 VIEW  COMPARISON:  08/31/2023  FINDINGS: The lungs are clear without focal pneumonia, edema, pneumothorax or pleural effusion. The cardiopericardial silhouette is within normal limits for size. No acute bony abnormality.  IMPRESSION: No active cardiopulmonary disease.  Electronically Signed   By: Camellia Candle M.D.   On: 10/21/2023 08:02 DG Chest 2 View CLINICAL DATA:  Shortness of breath. Chest pain. Nausea and vomiting.  EXAM: CHEST - 2 VIEW  COMPARISON:  10/17/2023  FINDINGS: Streaky density at the left base is compatible with atelectasis. Right lung clear. Interstitial markings are diffusely coarsened with chronic features. The cardiopericardial silhouette is within normal limits for size. No acute bony abnormality.  IMPRESSION: Streaky density at the left base is compatible with atelectasis.  Electronically Signed   By: Camellia Candle M.D.   On: 10/21/2023 07:58  Lab Results  Component Value Date   WBC 9.0 10/21/2023   HGB 16.7 10/21/2023   HCT 49.5 10/21/2023   MCV 85.2 10/21/2023   PLT 222 10/21/2023   Last metabolic  panel Lab Results  Component Value Date   GLUCOSE 265 (H) 10/21/2023   NA 135 10/21/2023   K 3.4 (L) 10/21/2023   CL  100 10/21/2023   CO2 24 10/21/2023   BUN 13 10/21/2023   CREATININE 1.27 (H) 10/21/2023   GFRNONAA >60 10/21/2023   CALCIUM  9.1 10/21/2023   PHOS 4.6 08/25/2022   PROT 7.3 09/10/2023   ALBUMIN 4.6 06/21/2023   BILITOT 0.6 09/10/2023   ALKPHOS 73 06/21/2023   AST 19 09/10/2023   ALT 19 09/10/2023   ANIONGAP 11 10/21/2023    Assessment and Plan: * Acute respiratory failure with hypoxia (HCC) Acute respiratory failure with hypoxia requiring 2 L on presentation Likely multifactorial contributions of body habitus, COPD as well as acute on chronic HFpEF Positive cough wheezing increased sputum production with 1 pack plus per day smoking BNP in the 300s-well above baseline with orthopnea and PND Chest x-ray with?  Streaky opacity at the left base Status post IV Lasix  in the ER Will place on twice daily home 40 mg torsemide  IV solumedrol  Duonebs  IV rocephin  and azithromycin  for broadened resp coverage  D-dimer within normal limits Consider non-contrast CT chest to rule out pneumonia given left base changes on chest x-ray and hypoxia  Strict ins and outs and daily weights Continue supplemental oxygen Discussed tobacco cessation at length Monitor   Acute on chronic heart failure with preserved ejection fraction (HFpEF) (HCC) Echo 05/28/23: EF 50-55% with moderate LVH, Grade III DD and mild MR  Decompensated respiratory status today with noted elevated BNP in the 300s (baseline BNP around 100) Status post IV Lasix  in ER Patient unsure about diuretic regimen, though 10/2022 cardiology note with patient being on  torsemide  40mg  daily with additional 40mg  on M, W, F  Will place on BID 40 mg torsemide  dosing in review of most recent cardiology office note Wt fairly stable around 104-105kg over past 1-2 weeks  Strict ins and outs and daily weights Consider cardiology  consultation as appropriate Monitor   COPD with acute exacerbation (HCC) Positive cough, wheezing, increased sputum production with overlapping CHF exacerbation and decompensated respiratory failure on 2 L CXR w/ noted streaky density in L bases concerning for ? Atlectasis  IV Solu-Medrol  DuoNebs IV Rocephin  and azithromycin  for expanded resp coverage  Continue supplemental oxygen Discussed smoking cessation at length  CAD (coronary artery disease) Baseline CAD followed by [facility] Cardiology  RHC/LHC 05/29/23:   Moderate, nonobstructive coronary artery disease with sequential 50-60% ostial and proximal LAD stenoses that are not hemodynamically significant Mild chest pressure in setting of CHF and COPD exacerbations- similar profile from prior evaluations  Trop in 20s today which is baseline  EKG NSR w/ TWI in anterolateral leads which appear to be similar to prior EKGs Continue home regimen including aspirin , Crestor , Zetia  Monitor cardiology consultation as clinically appropriate    Type 2 diabetes mellitus with hyperglycemia (HCC) Blood sugar in 260s SSI Long-acting insulin  Monitor blood sugars with steroid use  Gout Continue allopurinol   Former heavy tobacco smoker Still smoking 1 PPD  Discussed cessation at length    Chronic kidney disease (CKD) stage G2/A3, mildly decreased glomerular filtration rate (GFR) between 60-89 mL/min/1.73 square meter and albuminuria creatinine ratio greater than 300 mg/g Creatinine 1.27 today with GFR greater than 60 Monitor  Essential hypertension Systolic pressures 180s to 190s Titrate BP regimen with treatment Monitor      Advance Care Planning:   Code Status: Full Code   Consults: None   Family Communication: No family at the bedside   Severity of Illness: The appropriate patient status for this patient is INPATIENT. Inpatient status is judged to  be reasonable and necessary in order to provide the required intensity of service  to ensure the patient's safety. The patient's presenting symptoms, physical exam findings, and initial radiographic and laboratory data in the context of their chronic comorbidities is felt to place them at high risk for further clinical deterioration. Furthermore, it is not anticipated that the patient will be medically stable for discharge from the hospital within 2 midnights of admission.   * I certify that at the point of admission it is my clinical judgment that the patient will require inpatient hospital care spanning beyond 2 midnights from the point of admission due to high intensity of service, high risk for further deterioration and high frequency of surveillance required.*  Author: Elspeth JINNY Masters, MD 10/21/2023 10:34 AM  For on call review [email or web address].

## 2023-10-21 NOTE — Assessment & Plan Note (Signed)
 Baseline CAD followed by [facility] Cardiology  RHC/LHC 05/29/23:   Moderate, nonobstructive coronary artery disease with sequential 50-60% ostial and proximal LAD stenoses that are not hemodynamically significant Mild chest pressure in setting of CHF and COPD exacerbations- similar profile from prior evaluations  Trop in 20s today which is baseline  EKG NSR w/ TWI in anterolateral leads which appear to be similar to prior EKGs Continue home regimen including aspirin , Crestor , Zetia  Monitor cardiology consultation as clinically appropriate

## 2023-10-21 NOTE — Progress Notes (Signed)
 PHARMACIST - PHYSICIAN COMMUNICATION  CONCERNING:  Enoxaparin  (Lovenox ) for DVT Prophylaxis   ASSESSMENT: Patient was prescribed enoxaparin  40 mg subcutaneously every 24 hours for VTE prophylaxis.   Body mass index is 37.12 kg/m.  Estimated Creatinine Clearance: 73.5 mL/min (A) (by C-G formula based on SCr of 1.27 mg/dL (H)).  Based on [facility] policy, patient qualifies for enoxaparin  dosing of 0.5 mg per kilogram of total body weight every 24 hours because their body mass index is >30 kg/m2.  PLAN: Pharmacy has adjusted enoxaparin  dose per [facility] policy.  Description: Patient is now receiving enoxaparin  0.5 mg/kg subcutaneously every 24 hours.  Will M. Lenon, PharmD Clinical Pharmacist 10/21/2023 10:14 AM

## 2023-10-21 NOTE — Assessment & Plan Note (Addendum)
 Echo 05/28/23: EF 50-55% with moderate LVH, Grade III DD and mild MR  Decompensated respiratory status today with noted elevated BNP in the 300s (baseline BNP around 100) Status post IV Lasix  in ER Patient unsure about diuretic regimen, though 10/2022 cardiology note with patient being on  torsemide  40mg  daily with additional 40mg  on M, W, F  Will place on BID 40 mg torsemide  dosing in review of most recent cardiology office note Wt fairly stable around 104-105kg over past 1-2 weeks  Strict ins and outs and daily weights Consider cardiology consultation as appropriate Monitor

## 2023-10-21 NOTE — Assessment & Plan Note (Signed)
 Creatinine 1.27 today with GFR greater than 60 Monitor

## 2023-10-21 NOTE — ED Notes (Signed)
 Patient called this RN to bedside stating he had one episode of vomiting and was feeling nauseous. This RN administered zofran PRN.

## 2023-10-21 NOTE — ED Notes (Signed)
 Patient stated Zithromax was itching and burning his arm. MD [facility] notified. MD Alvester Morin ordered RN to stop Zithromax at this time.

## 2023-10-21 NOTE — ED Provider Notes (Signed)
 [facility] Provider Note    Event Date/Time   First MD Initiated Contact with Patient 10/21/23 [phone]     (approximate)   History   Chest Pain and Shortness of Breath   HPI  Cory Rivas is a 58 y.o. male history of CHF as well as asthma presents to the ER for evaluation of several days of chest pain and shortness of breath.  On arrival to the ER patient found to be hypoxic on room air.  He does not wear home oxygen.  States he has been having frequent cough.  States he was treated for pneumonia bronchitis a month ago.  States he has used some breathing treatments at home with some improvement and feels like he is wheezing particular at night but also endorsing orthopnea.  States the pain is mid sternal nonradiating.  No pleuritic chest pain.     Physical Exam   Triage Vital Signs: ED Triage Vitals  Encounter Vitals Group     BP 10/21/23 0735 (!) 191/108     Systolic BP Percentile --      Diastolic BP Percentile --      Pulse Rate 10/21/23 0735 98     Resp 10/21/23 0735 (!) 21     Temp 10/21/23 0735 97.9 F (36.6 C)     Temp src --      SpO2 10/21/23 0735 (!) 87 %     Weight 10/21/23 0736 230 lb (104.3 kg)     Height 10/21/23 0736 5' 6 (1.676 m)     Head Circumference --      Peak Flow --      Pain Score 10/21/23 0736 0     Pain Loc --      Pain Education --      Exclude from Growth Chart --     Most recent vital signs: Vitals:   10/21/23 0738 10/21/23 0900  BP:  (!) 176/93  Pulse:  72  Resp:    Temp:    SpO2: 91% 94%     Constitutional: Alert  Eyes: Conjunctivae are normal.  Head: Atraumatic. Nose: No congestion/rhinnorhea. Mouth/Throat: Mucous membranes are moist.   Neck: Painless ROM.  Cardiovascular:   Good peripheral circulation. Respiratory: Normal respiratory effort.  Coarse breath sounds throughout. Gastrointestinal: Soft and nontender.  Musculoskeletal:  no deformity Neurologic:  MAE spontaneously. No gross focal  neurologic deficits are appreciated.  Skin:  Skin is warm, dry and intact. No rash noted. Psychiatric: Mood and affect are normal. Speech and behavior are normal.    ED Results / Procedures / Treatments   Labs (all labs ordered are listed, but only abnormal results are displayed) Labs Reviewed  BASIC METABOLIC PANEL - Abnormal; Notable for the following components:      Result Value   Potassium 3.4 (*)    Glucose, Bld 265 (*)    Creatinine, Ser 1.27 (*)    All other components within normal limits  BRAIN NATRIURETIC PEPTIDE - Abnormal; Notable for the following components:   B Natriuretic Peptide 331.6 (*)    All other components within normal limits  TROPONIN I (HIGH SENSITIVITY) - Abnormal; Notable for the following components:   Troponin I (High Sensitivity) 21 (*)    All other components within normal limits  RESP PANEL BY RT-PCR (RSV, FLU A&B, COVID)  [id number]  CBC  D-DIMER, QUANTITATIVE  TROPONIN I (HIGH SENSITIVITY)     EKG  ED ECG REPORT I, Belvie Essex, the  attending physician, personally viewed and interpreted this ECG.   Date: 10/21/2023  EKG Time: 7:34  Rate: 95  Rhythm: sinus  Axis: normal  Intervals:normal  ST&T Change:  nonspecific st abn    RADIOLOGY Please see ED Course for my review and interpretation.  I personally reviewed all radiographic images ordered to evaluate for the above acute complaints and reviewed radiology reports and findings.  These findings were personally discussed with the patient.  Please see medical record for radiology report.    PROCEDURES:  Critical Care performed: Yes, see critical care procedure note(s)  .Critical Care  Performed by: Lang Dover, MD Authorized by: Lang Dover, MD   Critical care provider statement:    Critical care time (minutes):  35   Critical care was necessary to treat or prevent imminent or life-threatening deterioration of the following conditions:  Respiratory failure    Critical care was time spent personally by me on the following activities:  Ordering and performing treatments and interventions, ordering and review of laboratory studies, ordering and review of radiographic studies, pulse oximetry, re-evaluation of patient's condition, review of old charts, obtaining history from patient or surrogate, examination of patient, evaluation of patient's response to treatment, discussions with primary provider, discussions with consultants and development of treatment plan with patient or surrogate    MEDICATIONS ORDERED IN ED: Medications  furosemide  (LASIX ) injection 60 mg (has no administration in time range)  ipratropium-albuterol  (DUONEB) 0.5-2.5 (3) MG/3ML nebulizer solution 3 mL (3 mLs Nebulization Given 10/21/23 0845)  methylPREDNISolone  sodium succinate (SOLU-MEDROL ) 125 mg/2 mL injection 125 mg (125 mg Intravenous Given 10/21/23 0845)  hydrALAZINE  (APRESOLINE ) tablet 25 mg (25 mg Oral Given 10/21/23 0841)     IMPRESSION / MDM / ASSESSMENT AND PLAN / ED COURSE  I reviewed the triage vital signs and the nursing notes.                              Differential diagnosis includes, but is not limited to, Asthma, copd, CHF, pna, ptx, malignancy, Pe, anemia  Patient presenting to the ER for evaluation of symptoms as described above.  Based on symptoms, risk factors and considered above differential, this presenting complaint could reflect a potentially life-threatening illness therefore the patient will be placed on continuous pulse oximetry and telemetry for monitoring.  Laboratory evaluation will be sent to evaluate for the above complaints.      Clinical Course as of 10/21/23 0946  Mon Oct 21, 2023  0750 ED EKG [KJ]  0753 Chest x-ray on my review and interpretation without evidence of consolidation or pneumothorax. [PR]  [id number] Troponin is mildly elevated.  Also hyperglycemic.  Looks more comfortable on 2 L nasal cannula. [PR]  0945 Patient's BNP is mildly  elevated.  D-dimer negative.  Patient given nebulizer treatment as well as steroids with some improvement.  Also may have a touch of CHF as well though I think this is more likely bronchitis and COPD.  Given his acute hypoxia will consult hospitalist for admission. [PR]    Clinical Course User Index [KJ] Leonce Falling, Student-PA [PR] Lang Dover, MD     FINAL CLINICAL IMPRESSION(S) / ED DIAGNOSES   Final diagnoses:  Acute respiratory failure with hypoxia [facility])     Rx / DC Orders   ED Discharge Orders     None        Note:  This document was prepared using Dragon voice recognition software and  may include unintentional dictation errors.    Lang Dover, MD 10/21/23 [phone]

## 2023-10-22 ENCOUNTER — Telehealth (HOSPITAL_COMMUNITY): Payer: Self-pay | Admitting: Pharmacy Technician

## 2023-10-22 ENCOUNTER — Telehealth: Payer: Self-pay

## 2023-10-22 ENCOUNTER — Other Ambulatory Visit (HOSPITAL_COMMUNITY): Payer: Self-pay

## 2023-10-22 DIAGNOSIS — J9601 Acute respiratory failure with hypoxia: Secondary | ICD-10-CM | POA: Diagnosis not present

## 2023-10-22 LAB — COMPREHENSIVE METABOLIC PANEL
ALT: 23 U/L (ref 0–44)
AST: 21 U/L (ref 15–41)
Albumin: 3.8 g/dL (ref 3.5–5.0)
Alkaline Phosphatase: 66 U/L (ref 38–126)
Anion gap: 16 — ABNORMAL HIGH (ref 5–15)
BUN: 23 mg/dL — ABNORMAL HIGH (ref 6–20)
CO2: 27 mmol/L (ref 22–32)
Calcium: 8.9 mg/dL (ref 8.9–10.3)
Chloride: 93 mmol/L — ABNORMAL LOW (ref 98–111)
Creatinine, Ser: 1.47 mg/dL — ABNORMAL HIGH (ref 0.61–1.24)
GFR, Estimated: 56 mL/min — ABNORMAL LOW (ref >60)
Glucose, Bld: 288 mg/dL — ABNORMAL HIGH (ref 70–99)
Potassium: 3.6 mmol/L (ref 3.5–5.1)
Sodium: 136 mmol/L (ref 135–145)
Total Bilirubin: 1 mg/dL (ref 0.0–1.2)
Total Protein: 7.3 g/dL (ref 6.5–8.1)

## 2023-10-22 LAB — TROPONIN I (HIGH SENSITIVITY): Troponin I (High Sensitivity): 19 ng/L — ABNORMAL HIGH (ref <18)

## 2023-10-22 LAB — RESPIRATORY PANEL BY PCR

## 2023-10-22 LAB — CBC
HCT: 46.1 % (ref 39.0–52.0)
Hemoglobin: 16 g/dL (ref 13.0–17.0)
MCH: 28.9 pg (ref 26.0–34.0)
MCHC: 34.7 g/dL (ref 30.0–36.0)
MCV: 83.4 fL (ref 80.0–100.0)
Platelets: 217 10*3/uL (ref 150–400)
RBC: 5.53 MIL/uL (ref 4.22–5.81)
RDW: 12.9 % (ref 11.5–15.5)
WBC: 16.1 10*3/uL — ABNORMAL HIGH (ref 4.0–10.5)
nRBC: 0 % (ref 0.0–0.2)

## 2023-10-22 LAB — GLUCOSE, CAPILLARY
Glucose-Capillary: 252 mg/dL — ABNORMAL HIGH (ref 70–99)
Glucose-Capillary: 277 mg/dL — ABNORMAL HIGH (ref 70–99)
Glucose-Capillary: 192 mg/dL — ABNORMAL HIGH (ref 70–99)
Glucose-Capillary: 215 mg/dL — ABNORMAL HIGH (ref 70–99)

## 2023-10-22 LAB — PHOSPHORUS: Phosphorus: 3.6 mg/dL (ref 2.5–4.6)

## 2023-10-22 LAB — URIC ACID: Uric Acid, Serum: 10.8 mg/dL — ABNORMAL HIGH (ref 3.7–8.6)

## 2023-10-22 LAB — CBG MONITORING, ED: Glucose-Capillary: 283 mg/dL — ABNORMAL HIGH (ref 70–99)

## 2023-10-22 LAB — MAGNESIUM: Magnesium: 1.8 mg/dL (ref 1.7–2.4)

## 2023-10-22 MED ORDER — CARVEDILOL 25 MG PO TABS
25.0000 mg | ORAL_TABLET | Freq: Two times a day (BID) | ORAL | Status: DC
  Administered 2023-10-22 – 2023-10-24 (×4): 25 mg via ORAL
  Filled 2023-10-22 (×4): qty 1

## 2023-10-22 MED ORDER — SPIRONOLACTONE 25 MG PO TABS
25.0000 mg | ORAL_TABLET | Freq: Every day | ORAL | Status: DC
  Administered 2023-10-22: 25 mg via ORAL
  Filled 2023-10-22: qty 1

## 2023-10-22 MED ORDER — ROSUVASTATIN CALCIUM 10 MG PO TABS
40.0000 mg | ORAL_TABLET | Freq: Every day | ORAL | Status: DC
  Administered 2023-10-22 – 2023-10-24 (×3): 40 mg via ORAL
  Filled 2023-10-22 (×3): qty 4

## 2023-10-22 MED ORDER — EZETIMIBE 10 MG PO TABS
10.0000 mg | ORAL_TABLET | Freq: Every day | ORAL | Status: DC
  Administered 2023-10-22 – 2023-10-24 (×3): 10 mg via ORAL
  Filled 2023-10-22 (×3): qty 1

## 2023-10-22 MED ORDER — DULOXETINE HCL 30 MG PO CPEP
60.0000 mg | ORAL_CAPSULE | Freq: Every day | ORAL | Status: DC
  Administered 2023-10-23 – 2023-10-24 (×2): 60 mg via ORAL
  Filled 2023-10-22 (×2): qty 2

## 2023-10-22 MED ORDER — DULOXETINE HCL 30 MG PO CPEP
30.0000 mg | ORAL_CAPSULE | Freq: Every day | ORAL | Status: DC
  Administered 2023-10-23: 30 mg via ORAL
  Filled 2023-10-22: qty 1

## 2023-10-22 MED ORDER — IPRATROPIUM-ALBUTEROL 0.5-2.5 (3) MG/3ML IN SOLN: 3.0000 mL | Freq: Four times a day (QID) | RESPIRATORY_TRACT | Status: DC | PRN

## 2023-10-22 MED ORDER — ASPIRIN 81 MG PO TBEC
81.0000 mg | DELAYED_RELEASE_TABLET | Freq: Every day | ORAL | Status: DC
  Administered 2023-10-22 – 2023-10-24 (×3): 81 mg via ORAL
  Filled 2023-10-22 (×3): qty 1

## 2023-10-22 MED ORDER — ALLOPURINOL 300 MG PO TABS
300.0000 mg | ORAL_TABLET | Freq: Every day | ORAL | Status: DC
  Administered 2023-10-22 – 2023-10-24 (×3): 300 mg via ORAL
  Filled 2023-10-22 (×3): qty 1

## 2023-10-22 MED ORDER — LORATADINE 10 MG PO TABS
10.0000 mg | ORAL_TABLET | Freq: Every evening | ORAL | Status: DC
  Administered 2023-10-22 – 2023-10-23 (×2): 10 mg via ORAL
  Filled 2023-10-22 (×2): qty 1

## 2023-10-22 MED ORDER — EMPAGLIFLOZIN 10 MG PO TABS
10.0000 mg | ORAL_TABLET | Freq: Every day | ORAL | Status: DC
  Administered 2023-10-22 – 2023-10-24 (×3): 10 mg via ORAL
  Filled 2023-10-22 (×3): qty 1

## 2023-10-22 MED ORDER — DULOXETINE HCL 30 MG PO CPEP: 30.0000 mg | ORAL_CAPSULE | Freq: Two times a day (BID) | ORAL | Status: DC

## 2023-10-22 MED ORDER — PREGABALIN 50 MG PO CAPS
50.0000 mg | ORAL_CAPSULE | Freq: Two times a day (BID) | ORAL | Status: DC
  Administered 2023-10-22 – 2023-10-24 (×5): 50 mg via ORAL
  Filled 2023-10-22 (×5): qty 1

## 2023-10-22 MED ORDER — AMLODIPINE BESYLATE 5 MG PO TABS: 5.0000 mg | ORAL_TABLET | Freq: Every day | ORAL | Status: DC

## 2023-10-22 MED ORDER — SALINE SPRAY 0.65 % NA SOLN
1.0000 | NASAL | Status: DC | PRN
  Administered 2023-10-22: 1 via NASAL
  Filled 2023-10-22: qty 44

## 2023-10-22 MED ORDER — TIOTROPIUM BROMIDE MONOHYDRATE 18 MCG IN CAPS
18.0000 ug | ORAL_CAPSULE | Freq: Every day | RESPIRATORY_TRACT | Status: DC
Start: 2023-10-22 — End: 2023-10-24
  Administered 2023-10-22 – 2023-10-24 (×3): 18 ug via RESPIRATORY_TRACT
  Filled 2023-10-22: qty 1

## 2023-10-22 MED ORDER — ACETAMINOPHEN 325 MG PO TABS
650.0000 mg | ORAL_TABLET | Freq: Four times a day (QID) | ORAL | Status: DC | PRN
  Administered 2023-10-22: 650 mg via ORAL
  Filled 2023-10-22: qty 2

## 2023-10-22 MED ORDER — PANTOPRAZOLE SODIUM 40 MG PO TBEC
40.0000 mg | DELAYED_RELEASE_TABLET | Freq: Every day | ORAL | Status: DC
  Administered 2023-10-22 – 2023-10-24 (×3): 40 mg via ORAL
  Filled 2023-10-22 (×3): qty 1

## 2023-10-22 MED ORDER — LISINOPRIL 20 MG PO TABS
40.0000 mg | ORAL_TABLET | Freq: Every day | ORAL | Status: DC
  Administered 2023-10-22: 40 mg via ORAL
  Filled 2023-10-22: qty 2

## 2023-10-22 MED ORDER — TRAMADOL HCL 50 MG PO TABS
50.0000 mg | ORAL_TABLET | Freq: Four times a day (QID) | ORAL | Status: DC | PRN
  Administered 2023-10-22: 50 mg via ORAL
  Filled 2023-10-22: qty 1

## 2023-10-22 NOTE — Inpatient Diabetes Management (Addendum)
 Inpatient Diabetes Program Recommendations  AACE/ADA: New Consensus Statement on Inpatient Glycemic Control   Target Ranges:  Prepandial:   less than 140 mg/dL      Peak postprandial:   less than 180 mg/dL (1-2 hours)      Critically ill patients:  140 - 180 mg/dL    Latest Reference Range & Units 10/21/23 11:33 10/21/23 16:25 10/21/23 21:12 10/22/23 07:30 10/22/23 08:22  Glucose-Capillary 70 - 99 mg/dL 628 (H) 589 (H) 756 (H) 283 (H) 252 (H)   Review of Glycemic Control  Diabetes history: DM2 Outpatient Diabetes medications: Lantus  20 units at bedtime, Humalog  8 units TID, Metformin  2000 mg at bedtime, Trulicity  3 mg Qweek (Sunday) Current orders for Inpatient glycemic control: Semglee  20 units daily, Novolog  0-15 units TID with meals, Novolog  0-5 units at bedtime, Novolog  4 units TID with meals; Prednisone  40 mg QAM  Inpatient Diabetes Program Recommendations:    Outpatient DM: Please provide Rx for Dexcom G7 sensors (#[id number]) at discharge.  NOTE: Noted consult for diabetes coordinator for Continuous Glucose Monitoring.  Patient admitted with acute respiratory failure, acute on chronic CHF, and COPDE.  Noted patient was ordered steroids which have been decreased from Solumedrol to Prednisone . Will plan to talk with patient today regarding CGM.  Addendum 10/22/23@11 :54-Spoke with patient at bedside regarding DM. Patient confirms he is taking  Lantus  20 units at bedtime, Humalog  8 units TID, Metformin  2000 mg at bedtime, and Trulicity  3 mg Qweek (Sunday) for DM as an outpatient. Patient reports that he has been doing finger sticks lately but he had been using Dexcom G7 sensors but his last sensor was removed by the hospital prior to radiology test. Encouraged patient to call Dexcom [phone] number to ask for a replacement sensor since it was removed by the hospital. Provided patient with 2 samples of Dexcom G7 sensors for him to resume using once he is discharged. Patient reports that his glucose  has been running higher lately (in the 200-300's) despite taking DM medications as prescribed. Discussed glucose and A1C goals. Discussed how uncontrolled DM can contribute to complications and encouraged patient to get DM under better control.  Patient states he has been sick for several weeks and has also taken steroids over the past month. Discussed how glucose cane be more elevated during sickness and especially with steroids. Encouraged patient to reach out to his PCP for follow up so PCP can assist him with adjusting DM medications if needed. Patient verbalized understanding of information discussed and states he has no questions at this time.  Thanks, Earnie Gainer, RN, MSN, CDCES Diabetes Coordinator Inpatient Diabetes Program [phone] (Team Pager from 8am to 5pm)

## 2023-10-22 NOTE — Telephone Encounter (Signed)
 Pharmacy Patient Advocate Encounter  Received notification from [facility] that Prior Authorization for FreeStyle Libre 3 Sensor has been APPROVED from 10/22/2023 to 04/18/2024. Ran test claim, Copay is $0.00. This test claim was processed through [facility]- copay amounts may vary at other pharmacies due to pharmacy/plan contracts, or as the patient moves through the different stages of their insurance plan.   PA #/Case ID/Reference #: [id number] Key: [id number]

## 2023-10-22 NOTE — Progress Notes (Signed)
 Heart Failure Nurse Navigator Progress Note  PCP: Leavy Mole, PA-C PCP-Cardiologist: Frutoso, MD [name], FNP @ [facility] Admission Diagnosis: Acute Respiratory Failure with Hypoxia Admitted from: Home  Presentation:   Cory Rivas presented with chest pain and shortness of breath. Found to be hypoxic on room air. Patient has a cough and had been treated for pneumonia bronchitis a month ago. BNP 331.6 Troponin was 21. Chest Xray-streaky density at the left base is compatible with atelectasis.  ECHO/ LVEF: 05/28/23-50-55%  Clinical Course:  Past Medical History:  Diagnosis Date   Bulging of cervical intervertebral disc    CHF (congestive heart failure) (HCC)    Diabetes mellitus without complication (HCC)    Gout    High cholesterol    Hypertension    Stress due to family tension 04/15/2019     Social History   Socioeconomic History   Marital status: Divorced    Spouse name: Not on file   Number of children: 0   Years of education: 9   Highest education level: 9th grade  Occupational History   Occupation: disability  Tobacco Use   Smoking status: Some Days    Current packs/day: 0.00    Average packs/day: 1 pack/day for 37.0 years (37.0 ttl pk-yrs)    Types: Cigarettes    Start date: 10/19/1989    Last attempt to quit: 08/20/2022    Years since quitting: 1.1   Smokeless tobacco: Never   Tobacco comments:    30+ years hx smoking 2 ppd, stopped smoking briefly in April 2022 for a surgery and then restarted smoking about 1/2 ppd last 4-5 months         5-6cigs daily- 04/10/2023  Vaping Use   Vaping status: Never Used  Substance and Sexual Activity   Alcohol use: No   Drug use: No   Sexual activity: Not Currently    Partners: Female  Other Topics Concern   Not on file  Social History Narrative   Lives with sister [name], brother in law [name]), and his mother - Frederick Marro   Social Drivers of Health   Financial  Resource Strain: Medium Risk (12/06/2022)   Overall Financial Resource Strain (CARDIA)    Difficulty of Paying Living Expenses: Somewhat hard  Food Insecurity: No Food Insecurity (08/20/2023)   Hunger Vital Sign    Worried About Running Out of Food in the Last Year: Never true    Ran Out of Food in the Last Year: Never true  Recent Concern: Food Insecurity - Food Insecurity Present (05/28/2023)   Hunger Vital Sign    Worried About Running Out of Food in the Last Year: Often true    Ran Out of Food in the Last Year: Often true  Transportation Needs: No Transportation Needs (08/20/2023)   PRAPARE - Administrator, Civil Service (Medical): No    Lack of Transportation (Non-Medical): No  Physical Activity: Insufficiently Active (12/06/2022)   Exercise Vital Sign    Days of Exercise per Week: 3 days    Minutes of Exercise per Session: 10 min  Stress: No Stress Concern Present (12/06/2022)   [employer] of Occupational Health - Occupational Stress Questionnaire    Feeling of Stress : Only a little  Social Connections: Socially Isolated (12/06/2022)   Social Connection and Isolation Panel [NHANES]    Frequency of Communication with Friends and Family: More than three times a week    Frequency of Social Gatherings with  Friends and Family: Twice a week    Attends Religious Services: Never    Database Administrator or Organizations: No    Attends Engineer, Structural: Never    Marital Status: Divorced    High Risk Criteria for Readmission and/or Poor Patient Outcomes: Heart failure hospital admissions (last 6 months): 0  No Show rate: 4 Difficult social situation: None Demonstrates medication adherence: Yes Primary Language: English Literacy level: Reading, Writing & Comprehension  Barriers of Care:   Diet & Fluid Restrictions Daily Weights Medication Compliance  Considerations/Referrals:   Referral made to Heart Failure Pharmacist Stewardship: Yes Referral  made to Heart Failure CSW/NCM TOC: No Referral made to Heart & Vascular TOC clinic: Appointment rescheduled due to hospitalization.  Patient aware of appointment change. 10/30/23 @ 9:00 AM  Items for Follow-up on DC/TOC: Diet & Fluid Restrictions Daily Weights Medication Compliance Continued Heart Failure Education   Charmaine Pines, RN, BSN [facility] Heart Failure Navigator Secure Chat Only

## 2023-10-22 NOTE — Telephone Encounter (Addendum)
 Pt aware, agreeable, and verbalized understanding   ----- Message from Cory Rivas Class sent at 10/22/2023  4:15 PM EST ----- Chest xray results were negative. Pneumonia has cleared up and no fluid in the lungs either. If cough has continued, please make appt with PCP.

## 2023-10-22 NOTE — Progress Notes (Signed)
 [facility] Progress Note  Patient: Cory Rivas    FMW:[id number]  DOA: 10/21/2023     Date of Service: the patient was seen and examined on 10/22/2023  Chief Complaint  Patient presents with   Chest Pain   Shortness of Breath   Brief hospital course: GARALD RHEW is a 58 y.o. male with medical history significant of HTN, HLD, DM, CAD, COPD/asthma, dCHF, gout, CKD-2, tobacco abuse, depression presenting with acute respiratory failure with hypoxia, COPD exacerbation, acute on chronic HFpEF, hyperglycemia.  Patient reports increased work of breathing over the past 2 to 3 days.  Positive mild central chest pressure.  Positive cough, wheezing, increased sputum production.  Positive orthopnea PND.  Still smoking 1 pack/day.  Noted had been admitted November 2024 for issues including hypertensive urgency.  Also had cardiology follow-up January 2.  Patient states he is unclear about his diuretic/medication regimen at present.  Does admit to eating salty foods.  Denies any alcohol use.  No fevers or chills.  No abdominal pain or diarrhea.  No focal hemiparesis or confusion.  Reports recently being treated for pneumonia.  Patient still smoking 1 pack/day despite symptoms. Presented to the ER afebrile, blood pressure 170s to 190s over 80s to 100s.  Satting 87% on room air.  Transition to 2 L nasal cannula to keep O2 sats greater than 91%.  White count 9, hemoglobin 16.7, platelets 222, D-dimer 0.31, COVID flu and RSV negative.  Creatinine 1.27, glucose 265, potassium 3.4.  Troponin 21.  BNP 332.  Chest x-ray with streaky density at the left base compatible atelectasis.   Assessment and Plan:  # Acute respiratory failure with hypoxia due to COPD versus CHF exacerbation Acute respiratory failure with hypoxia requiring 2 L on presentation Likely multifactorial contributions of body habitus, COPD as well as acute on chronic HFpEF Positive cough wheezing increased sputum production with 1 pack plus  per day smoking BNP in the 300s-well above baseline with orthopnea and PND Chest x-ray with?  Streaky opacity at the left base Status post IV Lasix  in the ER Continue torsemide  40 mg p.o. twice daily home dose. IV solumedrol  Duonebs  IV rocephin  and azithromycin  for broadened resp coverage  D-dimer within normal limits Strict ins and outs and daily weights Continue supplemental oxygen Monitor     # Acute on chronic heart failure with preserved ejection fraction (HFpEF)  Echo 05/28/23: EF 50-55% with moderate LVH, Grade III DD and mild MR  Decompensated respiratory status with noted elevated BNP in the 300s (baseline BNP around 100) Status post IV Lasix  in ER Patient unsure about diuretic regimen, though 10/2022 cardiology note with patient being on  torsemide  40mg  daily with additional 40mg  on M, W, F  Continue torsemide  40 mg p.o. twice daily Resumed Aldactone  and Jardiance  Monitor intake and output Cardiology consult if no improvement   # COPD with acute exacerbation (HCC) Positive cough, wheezing, increased sputum production with overlapping CHF exacerbation and decompensated respiratory failure on 2 L CXR w/ noted streaky density in L bases concerning for ? Atlectasis  IV Solu-Medrol  DuoNebs IV Rocephin  and azithromycin  for expanded resp coverage  Continue supplemental oxygen Discussed smoking cessation at length   # CAD (coronary artery disease) Baseline CAD followed by [facility]  RHC/LHC 05/29/23: Moderate, nonobstructive coronary artery disease with sequential 50-60% ostial and proximal LAD stenoses that are not hemodynamically significant Mild chest pressure in setting of CHF and COPD exacerbations- similar profile from prior evaluations  Trop  in 75s today which is baseline  EKG NSR w/ TWI in anterolateral leads which appear to be similar to prior EKGs Continue home regimen including aspirin , Crestor , Zetia     # Type 2 diabetes mellitus with  hyperglycemia Blood sugar in 260s SSI Long-acting insulin  Monitor blood sugars with steroid use   # Gout: Continue allopurinol  Follow uric acid level  # Former heavy tobacco smoker Still smoking 1 PPD, smoking cessation counseling done   # Chronic kidney disease (CKD) stage G2/A3, mildly decreased glomerular filtration rate (GFR) between 60-89 mL/min/1.73 square meter and albuminuria creatinine ratio greater than 300 mg/g 1/6 Creatinine 1.27  with GFR greater than 60 1/7 Cr 1.47 slightly elevated, continue to monitor Monitor   # Essential hypertension Continue Coreg  25 mg p.o. twice daily, lisinopril  40 mg p.o. daily, amlodipine  5 mg p.o. daily Resumed Aldactone  25 mg p.o. daily and to continue torsemide  40 mg p.o. twice daily Monitor BP and titrate medications accordingly   Obesity, class II Body mass index is 36.26 kg/m.  Interventions: Calorie restricted diet and daily exercise advised to lose body weight.  Lifestyle modification discussed.   Diet: Heart healthy/carb modified DVT Prophylaxis: Subcutaneous Lovenox    Advance goals of care discussion: Full code  Family Communication: family was not present at bedside, at the time of interview.  The pt provided permission to discuss medical plan with the family. Opportunity was given to ask question and all questions were answered satisfactorily.   Disposition:  Pt is from home, admitted with respiratory failure due to COPD and CHF, still has respiratory failure, which precludes a safe discharge. Discharge to home, when stable, may need few days to improve.  Subjective: No significant events overnight, patient breathing is getting better now, no chest pressure or pain.  No palpitations.  Complaining of neck pain due to prior neck fusion surgery   Physical Exam: General: NAD, lying comfortably Appear in no distress, affect appropriate Eyes: PERRLA ENT: Oral Mucosa Clear, moist  Neck: no JVD,  Cardiovascular: S1 and S2  Present, no Murmur,  Respiratory: Equal air entry bilaterally, mild bilateral crackles, no wheezing appreciated Abdomen: Bowel Sound present, Soft and no tenderness,  Skin: no rashes Extremities: no Pedal edema, no calf tenderness Neurologic: without any new focal findings Gait not checked due to patient safety concerns  Vitals:   10/22/23 0600 10/22/23 0731 10/22/23 0826 10/22/23 0859  BP: (!) 154/91 (!) 151/82 (!) 149/98   Pulse: 99 95 91   Resp: 18 17 17    Temp: 98.7 F (37.1 C)  98.2 F (36.8 C)   TempSrc:   Oral   SpO2: 94% 97% 97%   Weight:    101.9 kg  Height:    5' 6 (1.676 m)    Intake/Output Summary (Last 24 hours) at 10/22/2023 1524 Last data filed at 10/22/2023 1132 Gross per 24 hour  Intake 3 ml  Output 500 ml  Net -497 ml   Filed Weights   10/21/23 0736 10/22/23 0859  Weight: 104.3 kg 101.9 kg    Data Reviewed: I have personally reviewed and interpreted daily labs, tele strips, imagings as discussed above. I reviewed all nursing notes, pharmacy notes, vitals, pertinent old records I have discussed plan of care as described above with RN and patient/family.  CBC: Recent Labs  Lab 10/21/23 0735 10/22/23 0523  WBC 9.0 16.1*  HGB 16.7 16.0  HCT 49.5 46.1  MCV 85.2 83.4  PLT 222 217   Basic Metabolic Panel: Recent Labs  Lab 10/17/23 0944 10/21/23 0735 10/21/23 1647 10/22/23 0523  NA 132* 135  --  136  K 4.0 3.4*  --  3.6  CL 97* 100  --  93*  CO2 23 24  --  27  GLUCOSE 390* 265* 385* 288*  BUN 14 13  --  23*  CREATININE 1.33* 1.27*  --  1.47*  CALCIUM  8.4* 9.1  --  8.9  MG  --   --   --  1.8  PHOS  --   --   --  3.6    Studies: No results found.  Scheduled Meds:  enoxaparin  (LOVENOX ) injection  0.5 mg/kg Subcutaneous Q24H   insulin  aspart  0-15 Units Subcutaneous TID WC   insulin  aspart  0-5 Units Subcutaneous QHS   insulin  aspart  4 Units Subcutaneous TID WC   insulin  glargine-yfgn  20 Units Subcutaneous Daily   predniSONE   40 mg  Oral Q breakfast   pregabalin   50 mg Oral BID   sodium chloride  flush  3 mL Intravenous Q12H   torsemide   40 mg Oral BID   Continuous Infusions:  azithromycin  500 mg (10/22/23 1132)   cefTRIAXone  (ROCEPHIN )  IV 1 g (10/22/23 0912)   PRN Meds: acetaminophen , albuterol , hydrALAZINE , ondansetron  **OR** ondansetron  (ZOFRAN ) IV, sodium chloride , sodium chloride  flush, traMADol   Time spent: 35 minutes  Author: ELVAN SOR. MD [facility] 10/22/2023 3:24 PM  To reach On-call, see care teams to locate the attending and reach out to them via [email or web address]. If 7PM-7AM, please contact night-coverage If you still have difficulty reaching the attending provider, please page the [facility] (Director on Call) for [facility] on amion for assistance.

## 2023-10-22 NOTE — Progress Notes (Signed)
 Heart Failure Stewardship Pharmacy Note  PCP: Leavy Mole, PA-C PCP-Cardiologist: Redell Cave, MD  HPI: Cory Rivas is a 58 y.o. male with HTN, HLD, T2DM, CAD, COPD/asthma, dCHF, gout, CKD-2, tobacco abuse, depression who presented with worsening respiratory symptoms after being treated for pneumonia. CXR on admission noted streaky density at the left base compatible with atelectasis. BNP on admission up to 331.6 from 42.4 in clinic 5 days prior. Troponin on admission was 21.   Pertinent cardiac history: Stress test in 09/2020 was low risk with normal LVEF. Echo in 10/2020 with normal LVEF and grade I diastolic dysfunction and mild LVH. Echo in 08/2022 with normal LVEF and grade II diastolic dysfunction. Experienced chest pain 05/2023 and cardiac cath showed moderate nonobstructive CAD, PCWP of 18, CO/CI of 5.1/2.4. Echo at that time with LVEF of 50-55%, grade 3 diastolic dysfunction, moderate LVH.  Pertinent Lab Values: Creat  Date Value Ref Range Status  09/10/2023 1.26 0.70 - 1.30 mg/dL Final   Creatinine, Ser  Date Value Ref Range Status  10/22/2023 1.47 (H) 0.61 - 1.24 mg/dL Final   BUN  Date Value Ref Range Status  10/22/2023 23 (H) 6 - 20 mg/dL Final  91/79/7975 22 6 - 24 mg/dL Final   Potassium  Date Value Ref Range Status  10/22/2023 3.6 3.5 - 5.1 mmol/L Final   Sodium  Date Value Ref Range Status  10/22/2023 136 135 - 145 mmol/L Final  06/04/2023 139 134 - 144 mmol/L Final   Brain Natriuretic Peptide  Date Value Ref Range Status  07/01/2020 66 <100 pg/mL Final    Comment:    . BNP levels increase with age in the general population with the highest values seen in individuals greater than 81 years of age. Reference: J. Am. Penne. Cardiol. 2002; 59:023-017. .    B Natriuretic Peptide  Date Value Ref Range Status  10/21/2023 331.6 (H) 0.0 - 100.0 pg/mL Final    Comment:    Performed at [facility], [address] [place], [place] [address]    Magnesium   Date Value Ref Range Status  10/22/2023 1.8 1.7 - 2.4 mg/dL Final    Comment:    Performed at [facility], [address] [place], [place] [address]   Hemoglobin A1C  Date Value Ref Range Status  01/02/2022 6.5  Final   Hgb A1c MFr Bld  Date Value Ref Range Status  08/09/2023 9.8 (H) <5.7 % of total Hgb Final    Comment:    For someone without known diabetes, a hemoglobin A1c value of 6.5% or greater indicates that they may have  diabetes and this should be confirmed with a follow-up  test. . For someone with known diabetes, a value <7% indicates  that their diabetes is well controlled and a value  greater than or equal to 7% indicates suboptimal  control. A1c targets should be individualized based on  duration of diabetes, age, comorbid conditions, and  other considerations. . Currently, no consensus exists regarding use of hemoglobin A1c for diagnosis of diabetes for children. .    TSH  Date Value Ref Range Status  12/18/2017 1.86 0.41 - 5.90 Final    Vital Signs:  Temp:  [98.1 F (36.7 C)-98.7 F (37.1 C)] 98.2 F (36.8 C) (01/07 0826) Pulse Rate:  [65-108] 91 (01/07 0826) Cardiac Rhythm: Normal sinus rhythm (01/06 1920) Resp:  [16-25] 17 (01/07 0826) BP: (148-189)/(82-102) 149/98 (01/07 0826) SpO2:  [93 %-97 %] 97 % (01/07 0826) Weight:  [  101.9 kg (224 lb 10.4 oz)] 101.9 kg (224 lb 10.4 oz) (01/07 0859)  Intake/Output Summary (Last 24 hours) at 10/22/2023 0914 Last data filed at 10/21/2023 2123 Gross per 24 hour  Intake 303 ml  Output 2425 ml  Net -2122 ml    Current Heart Failure Medications:  Loop diuretic: torsemide  40 mg BID Beta-Blocker: none ACEI/ARB/ARNI: none MRA: none SGLT2i: none  Prior to admission Heart Failure Medications:  Loop diuretic: torsemide  40 mg daily and an additional 40 mg on MWF Beta-Blocker: carvedilol  25 mg BID ACEI/ARB/ARNI: lisinopril  40 mg daily MRA: Spironolactone  25 mg daily SGLT2i:  Jardiance  10 mg daily Other vasoactive medications include amlodipine  5 mg daily  Assessment: 1. Acute on chronic diastolic heart failure (LVEF 50-55%) with grade III diastolic dysfunction, due to NICM. NYHA class III symptoms.  -Symptoms: Reports shortness of breath and chest tightness exacerbated by coughing. Does have moderate orthopnea. No apparent LEE. Suspect symptoms are primarily his pneumonia, however these may be made worse by mild volume overload. -Volume: Volume is difficult to assess due to body habitus. Currently on torsemide  40 mg BID. Creatinine is trending up, but given rise in BNP, REDS elevated in clinic, and orthopnea, suspect he may have fluid on board. Can continue torsemide  today and monitor trend given symptom improvement this far.  -Hemodynamics: BP elevated 140-180s/90s. HR 90s. -AA:Rlmmzwuob held, can resume when respiratory symptoms are improved. -ACEI/ARB/ARNI: Consider transitioning lisinopril  to Entresto  given that Entresto  has mild benefit to hospitalization in HFpEF whereas lisinopril  does not. -MRA: Consider resuming spironolactone . -SGLT2i: Consider resuming Jardiance .  Plan: 1) Medication changes recommended at this time: -Consider resuming spironolactone  25 mg daily and Jardiance  10 mg daily  2) Patient assistance: -None  3) Education: - Patient has been educated on current HF medications and potential additions to HF medication regimen - Patient verbalizes understanding that over the next few months, these medication doses may change and more medications may be added to optimize HF regimen - Patient has been educated on basic disease state pathophysiology and goals of therapy  Medication Assistance / Insurance Benefits Check: Does the patient have prescription insurance?    Type of insurance plan:  Does the patient qualify for medication assistance through manufacturers or grants? no  Outpatient Pharmacy: Prior to admission outpatient pharmacy:  [facility]     Please do not hesitate to reach out with questions or concerns,  Jaun Bash, PharmD, CPP, BCPS Heart Failure Pharmacist  Phone - [phone] 10/22/2023 10:15 AM

## 2023-10-22 NOTE — Plan of Care (Signed)
  Problem: Education: Goal: Ability to describe self-care measures that may prevent or decrease complications (Diabetes Survival Skills Education) will improve Outcome: Progressing Goal: Individualized Educational Video(s) Outcome: Progressing   Problem: Coping: Goal: Ability to adjust to condition or change in health will improve Outcome: Progressing   Problem: Fluid Volume: Goal: Ability to maintain a balanced intake and output will improve Outcome: Progressing   Problem: Health Behavior/Discharge Planning: Goal: Ability to identify and utilize available resources and services will improve Outcome: Progressing Goal: Ability to manage health-related needs will improve Outcome: Progressing   Problem: Metabolic: Goal: Ability to maintain appropriate glucose levels will improve Outcome: Progressing   Problem: Nutritional: Goal: Maintenance of adequate nutrition will improve Outcome: Progressing Goal: Progress toward achieving an optimal weight will improve Outcome: Progressing   Problem: Skin Integrity: Goal: Risk for impaired skin integrity will decrease Outcome: Progressing   Problem: Tissue Perfusion: Goal: Adequacy of tissue perfusion will improve Outcome: Progressing   Problem: Education: Goal: Knowledge of General Education information will improve Description: Including pain rating scale, medication(s)/side effects and non-pharmacologic comfort measures Outcome: Progressing   Problem: Health Behavior/Discharge Planning: Goal: Ability to manage health-related needs will improve Outcome: Progressing   Problem: Clinical Measurements: Goal: Ability to maintain clinical measurements within normal limits will improve Outcome: Progressing Goal: Will remain free from infection Outcome: Progressing Goal: Diagnostic test results will improve Outcome: Progressing Goal: Respiratory complications will improve Outcome: Progressing Goal: Cardiovascular complication will  be avoided Outcome: Progressing   Problem: Activity: Goal: Risk for activity intolerance will decrease Outcome: Progressing   Problem: Nutrition: Goal: Adequate nutrition will be maintained Outcome: Progressing   Problem: Coping: Goal: Level of anxiety will decrease Outcome: Progressing   Problem: Elimination: Goal: Will not experience complications related to bowel motility Outcome: Progressing Goal: Will not experience complications related to urinary retention Outcome: Progressing   Problem: Pain Management: Goal: General experience of comfort will improve Outcome: Progressing   Problem: Safety: Goal: Ability to remain free from injury will improve Outcome: Progressing   Problem: Skin Integrity: Goal: Risk for impaired skin integrity will decrease Outcome: Progressing   Problem: Education: Goal: Ability to demonstrate management of disease process will improve Outcome: Progressing Goal: Ability to verbalize understanding of medication therapies will improve Outcome: Progressing Goal: Individualized Educational Video(s) Outcome: Progressing   Problem: Activity: Goal: Capacity to carry out activities will improve Outcome: Progressing   Problem: Cardiac: Goal: Ability to achieve and maintain adequate cardiopulmonary perfusion will improve Outcome: Progressing   Problem: Education: Goal: Knowledge of disease or condition will improve Outcome: Progressing Goal: Knowledge of the prescribed therapeutic regimen will improve Outcome: Progressing Goal: Individualized Educational Video(s) Outcome: Progressing   Problem: Activity: Goal: Ability to tolerate increased activity will improve Outcome: Progressing Goal: Will verbalize the importance of balancing activity with adequate rest periods Outcome: Progressing   Problem: Respiratory: Goal: Ability to maintain a clear airway will improve Outcome: Progressing Goal: Levels of oxygenation will improve Outcome:  Progressing Goal: Ability to maintain adequate ventilation will improve Outcome: Progressing

## 2023-10-22 NOTE — Telephone Encounter (Signed)
 Pharmacy Patient Advocate Encounter  Insurance verification completed.    The patient is insured through [facility].    Ran test claim for [employer] 3 sensors. Prior authorization required.  This test claim was processed through [facility]- copay amounts may vary at other pharmacies due to pharmacy/plan contracts, or as the patient moves through the different stages of their insurance plan.

## 2023-10-23 ENCOUNTER — Inpatient Hospital Stay: Payer: Medicaid Other

## 2023-10-23 DIAGNOSIS — J9601 Acute respiratory failure with hypoxia: Secondary | ICD-10-CM | POA: Diagnosis not present

## 2023-10-23 DIAGNOSIS — N289 Disorder of kidney and ureter, unspecified: Secondary | ICD-10-CM | POA: Diagnosis not present

## 2023-10-23 LAB — PHOSPHORUS: Phosphorus: 5 mg/dL — ABNORMAL HIGH (ref 2.5–4.6)

## 2023-10-23 LAB — CBC
HCT: 45.5 % (ref 39.0–52.0)
Hemoglobin: 15.1 g/dL (ref 13.0–17.0)
MCH: 28.9 pg (ref 26.0–34.0)
MCHC: 33.2 g/dL (ref 30.0–36.0)
MCV: 87 fL (ref 80.0–100.0)
Platelets: 210 10*3/uL (ref 150–400)
RBC: 5.23 MIL/uL (ref 4.22–5.81)
RDW: 13.5 % (ref 11.5–15.5)
WBC: 14.6 10*3/uL — ABNORMAL HIGH (ref 4.0–10.5)
nRBC: 0 % (ref 0.0–0.2)

## 2023-10-23 LAB — GLUCOSE, CAPILLARY
Glucose-Capillary: 289 mg/dL — ABNORMAL HIGH (ref 70–99)
Glucose-Capillary: 208 mg/dL — ABNORMAL HIGH (ref 70–99)
Glucose-Capillary: 176 mg/dL — ABNORMAL HIGH (ref 70–99)
Glucose-Capillary: 174 mg/dL — ABNORMAL HIGH (ref 70–99)

## 2023-10-23 LAB — BASIC METABOLIC PANEL
Anion gap: 14 (ref 5–15)
BUN: 39 mg/dL — ABNORMAL HIGH (ref 6–20)
CO2: 28 mmol/L (ref 22–32)
Calcium: 8.2 mg/dL — ABNORMAL LOW (ref 8.9–10.3)
Chloride: 94 mmol/L — ABNORMAL LOW (ref 98–111)
Creatinine, Ser: 2.18 mg/dL — ABNORMAL HIGH (ref 0.61–1.24)
GFR, Estimated: 35 mL/min — ABNORMAL LOW (ref >60)
Glucose, Bld: 294 mg/dL — ABNORMAL HIGH (ref 70–99)
Potassium: 4.2 mmol/L (ref 3.5–5.1)
Sodium: 136 mmol/L (ref 135–145)

## 2023-10-23 LAB — MAGNESIUM: Magnesium: 2.1 mg/dL (ref 1.7–2.4)

## 2023-10-23 MED ORDER — LISINOPRIL 20 MG PO TABS: 40.0000 mg | ORAL_TABLET | Freq: Every day | ORAL | Status: DC

## 2023-10-23 MED ORDER — SPIRONOLACTONE 25 MG PO TABS
25.0000 mg | ORAL_TABLET | Freq: Every day | ORAL | Status: DC
Start: 2023-10-25 — End: 2023-10-24

## 2023-10-23 MED ORDER — ENOXAPARIN SODIUM 60 MG/0.6ML IJ SOSY
0.5000 mg/kg | PREFILLED_SYRINGE | INTRAMUSCULAR | Status: DC
  Administered 2023-10-23: 50 mg via SUBCUTANEOUS
  Filled 2023-10-23: qty 0.6

## 2023-10-23 MED ORDER — TORSEMIDE 20 MG PO TABS: 40.0000 mg | ORAL_TABLET | Freq: Two times a day (BID) | ORAL | Status: DC

## 2023-10-23 MED ORDER — AMLODIPINE BESYLATE 5 MG PO TABS: 5.0000 mg | ORAL_TABLET | Freq: Every day | ORAL | Status: DC

## 2023-10-23 MED ORDER — SPIRONOLACTONE 25 MG PO TABS: 25.0000 mg | ORAL_TABLET | Freq: Every day | ORAL | Status: DC

## 2023-10-23 MED ORDER — AMLODIPINE BESYLATE 5 MG PO TABS
5.0000 mg | ORAL_TABLET | Freq: Every day | ORAL | Status: DC
  Administered 2023-10-23 – 2023-10-24 (×2): 5 mg via ORAL
  Filled 2023-10-23 (×2): qty 1

## 2023-10-23 NOTE — Plan of Care (Signed)
  Problem: Education: Goal: Ability to describe self-care measures that may prevent or decrease complications (Diabetes Survival Skills Education) will improve Outcome: Progressing   Problem: Coping: Goal: Ability to adjust to condition or change in health will improve Outcome: Progressing   Problem: Fluid Volume: Goal: Ability to maintain a balanced intake and output will improve Outcome: Progressing   Problem: Metabolic: Goal: Ability to maintain appropriate glucose levels will improve Outcome: Progressing   Problem: Clinical Measurements: Goal: Cardiovascular complication will be avoided Outcome: Progressing   Problem: Education: Goal: Ability to demonstrate management of disease process will improve Outcome: Progressing Goal: Ability to verbalize understanding of medication therapies will improve Outcome: Progressing Goal: Individualized Educational Video(s) Outcome: Progressing

## 2023-10-23 NOTE — Inpatient Diabetes Management (Signed)
 Inpatient Diabetes Program Recommendations  AACE/ADA: New Consensus Statement on Inpatient Glycemic Control   Target Ranges:  Prepandial:   less than 140 mg/dL      Peak postprandial:   less than 180 mg/dL (1-2 hours)      Critically ill patients:  140 - 180 mg/dL    Latest Reference Range & Units 10/22/23 07:30 10/22/23 08:22 10/22/23 11:50 10/22/23 17:47 10/22/23 20:50 10/23/23 08:13  Glucose-Capillary 70 - 99 mg/dL 716 (H) 747 (H) 722 (H) 192 (H) 215 (H) 289 (H)   Review of Glycemic Control  Diabetes history: DM2 Outpatient Diabetes medications: Lantus  20 units at bedtime, Humalog  8 units TID, Metformin  2000 mg at bedtime, Trulicity  3 mg Qweek (Sunday) Current orders for Inpatient glycemic control: Semglee  20 units daily, Novolog  0-15 units TID with meals, Novolog  0-5 units at bedtime, Novolog  4 units TID with meals, Jardiance  10 mg daily; Prednisone  40 mg QAM  Inpatient Diabetes Program Recommendations:    Insulin : If steroids are continued as ordered, please consider increasing Semglee  to 22 units daily and meal coverage to Novolog  6 units TID with meals.  Outpatient DM: Please provide Rx for Dexcom G7 sensors (#[id number]) at discharge.   Thanks, Earnie Gainer, RN, MSN, CDCES Diabetes Coordinator Inpatient Diabetes Program [phone] (Team Pager from 8am to 5pm)

## 2023-10-23 NOTE — Progress Notes (Signed)
 Heart Failure Stewardship Pharmacy Note  PCP: Leavy Mole, PA-C PCP-Cardiologist: Redell Cave, MD  HPI: Cory Rivas is a 58 y.o. male with HTN, HLD, T2DM, CAD, COPD/asthma, dCHF, gout, CKD-2, tobacco abuse, depression who presented with worsening respiratory symptoms after being treated for pneumonia. CXR on admission noted streaky density at the left base compatible with atelectasis. BNP on admission up to 331.6 from 42.4 in clinic 5 days prior. Troponin on admission was 21.   Pertinent cardiac history: Stress test in 09/2020 was low risk with normal LVEF. Echo in 10/2020 with normal LVEF and grade I diastolic dysfunction and mild LVH. Echo in 08/2022 with normal LVEF and grade II diastolic dysfunction. Experienced chest pain 05/2023 and cardiac cath showed moderate nonobstructive CAD, PCWP of 18, CO/CI of 5.1/2.4. Echo at that time with LVEF of 50-55%, grade 3 diastolic dysfunction, moderate LVH.  Pertinent Lab Values: Creat  Date Value Ref Range Status  09/10/2023 1.26 0.70 - 1.30 mg/dL Final   Creatinine, Ser  Date Value Ref Range Status  10/23/2023 2.18 (H) 0.61 - 1.24 mg/dL Final   BUN  Date Value Ref Range Status  10/23/2023 39 (H) 6 - 20 mg/dL Final  91/79/7975 22 6 - 24 mg/dL Final   Potassium  Date Value Ref Range Status  10/23/2023 4.2 3.5 - 5.1 mmol/L Final   Sodium  Date Value Ref Range Status  10/23/2023 136 135 - 145 mmol/L Final  06/04/2023 139 134 - 144 mmol/L Final   Brain Natriuretic Peptide  Date Value Ref Range Status  07/01/2020 65 <100 pg/mL Final    Comment:    . BNP levels increase with age in the general population with the highest values seen in individuals greater than 21 years of age. Reference: J. Am. Penne. Cardiol. 2002; 59:023-017. .    B Natriuretic Peptide  Date Value Ref Range Status  10/21/2023 331.6 (H) 0.0 - 100.0 pg/mL Final    Comment:    Performed at [facility], [address] [place], [place] [address]    Magnesium   Date Value Ref Range Status  10/23/2023 2.1 1.7 - 2.4 mg/dL Final    Comment:    Performed at [facility], [address] [place], [place] [address]   Hemoglobin A1C  Date Value Ref Range Status  01/02/2022 6.5  Final   Hgb A1c MFr Bld  Date Value Ref Range Status  08/09/2023 9.8 (H) <5.7 % of total Hgb Final    Comment:    For someone without known diabetes, a hemoglobin A1c value of 6.5% or greater indicates that they may have  diabetes and this should be confirmed with a follow-up  test. . For someone with known diabetes, a value <7% indicates  that their diabetes is well controlled and a value  greater than or equal to 7% indicates suboptimal  control. A1c targets should be individualized based on  duration of diabetes, age, comorbid conditions, and  other considerations. . Currently, no consensus exists regarding use of hemoglobin A1c for diagnosis of diabetes for children. .    TSH  Date Value Ref Range Status  12/18/2017 1.86 0.41 - 5.90 Final    Vital Signs:  Temp:  [97.8 F (36.6 C)-98.2 F (36.8 C)] 97.8 F (36.6 C) (01/08 0817) Pulse Rate:  [62-84] 62 (01/08 0817) Cardiac Rhythm: Normal sinus rhythm (01/07 2030) Resp:  [17-19] 18 (01/08 0817) BP: (99-143)/(60-82) 99/60 (01/08 0817) SpO2:  [94 %-96 %] 94 % (01/08 0817)  Intake/Output Summary (Last 24 hours) at 10/23/2023 1003 Last data filed at 10/22/2023 2058 Gross per 24 hour  Intake 240 ml  Output 650 ml  Net -410 ml    Current Heart Failure Medications:  Loop diuretic: torsemide  40 mg BID Beta-Blocker: none ACEI/ARB/ARNI: none MRA: none SGLT2i: none  Prior to admission Heart Failure Medications:  Loop diuretic: torsemide  40 mg daily and an additional 40 mg on MWF Beta-Blocker: carvedilol  25 mg BID ACEI/ARB/ARNI: lisinopril  40 mg daily MRA: Spironolactone  25 mg daily SGLT2i: Jardiance  10 mg daily Other vasoactive medications include amlodipine  5 mg  daily  Assessment: 1. Acute on chronic diastolic heart failure (LVEF 50-55%) with grade III diastolic dysfunction, due to NICM. NYHA class III symptoms.  -Symptoms: Reports shortness of breath and orthopnea is improved. No apparent LEE.  -Volume: Volume is difficult to assess due to body habitus and comorbid pneumonia. Currently on torsemide  40 mg BID. Creatinine rose. Torsemide  and spironolactone  were given this AM, but further doses postponed to tomorrow. Torsemide  may require holding tomorrow. -Hemodynamics: BP improved this AM. HR 70s. -AA:Rlmmzwuob held, can consider resuming tomorrow. -ACEI/ARB/ARNI: Lisinopril  retimed for tomorrow, though already given this AM. Will monitor trend. Suspect AKI is due to hypovolemia more than ACEi, but may require dose reduction if creatinine does not trend down. -MRA: Continue spironolactone . Creatinine bumped, suspect due to hypovolemia.  -SGLT2i: Continue Jardiance .   Plan: 1) Medication changes recommended at this time: -Agree with postponing diuretics. Would recommend holding torsemide  tomorrow, then resuming PTA dose of 40 mg daily and an additional 40 mg MWF if creatinine stabilized.  2) Patient assistance: -None  3) Education: - Patient has been educated on current HF medications and potential additions to HF medication regimen - Patient verbalizes understanding that over the next few months, these medication doses may change and more medications may be added to optimize HF regimen - Patient has been educated on basic disease state pathophysiology and goals of therapy  Medication Assistance / Insurance Benefits Check: Does the patient have prescription insurance?    Type of insurance plan:  Does the patient qualify for medication assistance through manufacturers or grants? no  Outpatient Pharmacy: Prior to admission outpatient pharmacy: [facility] pharmacy     Please do not hesitate to reach out with questions or concerns,  Jaun Bash, PharmD, CPP, BCPS Heart Failure Pharmacist  Phone - [phone] 10/23/2023 10:03 AM

## 2023-10-23 NOTE — Progress Notes (Signed)
 [facility] Progress Note  Patient: Cory Rivas    FMW:[id number]  DOA: 10/21/2023     Date of Service: the patient was seen and examined on 10/23/2023  Chief Complaint  Patient presents with   Chest Pain   Shortness of Breath   Brief hospital course: DANNIEL TONES is a 58 y.o. male with medical history significant of HTN, HLD, DM, CAD, COPD/asthma, dCHF, gout, CKD-2, tobacco abuse, depression presenting with acute respiratory failure with hypoxia, COPD exacerbation, acute on chronic HFpEF, hyperglycemia.  Patient reports increased work of breathing over the past 2 to 3 days.  Positive mild central chest pressure.  Positive cough, wheezing, increased sputum production.  Positive orthopnea PND.  Still smoking 1 pack/day.  Noted had been admitted November 2024 for issues including hypertensive urgency.  Also had cardiology follow-up January 2.  Patient states he is unclear about his diuretic/medication regimen at present.  Does admit to eating salty foods.  Denies any alcohol use.  No fevers or chills.  No abdominal pain or diarrhea.  No focal hemiparesis or confusion.  Reports recently being treated for pneumonia.  Patient still smoking 1 pack/day despite symptoms. Presented to the ER afebrile, blood pressure 170s to 190s over 80s to 100s.  Satting 87% on room air.  Transition to 2 L nasal cannula to keep O2 sats greater than 91%.  White count 9, hemoglobin 16.7, platelets 222, D-dimer 0.31, COVID flu and RSV negative.  Creatinine 1.27, glucose 265, potassium 3.4.  Troponin 21.  BNP 332.  Chest x-ray with streaky density at the left base compatible atelectasis.   Assessment and Plan:  # Acute respiratory failure with hypoxia due to COPD versus CHF exacerbation Acute respiratory failure with hypoxia requiring 2 L on presentation Likely multifactorial contributions of body habitus, COPD as well as acute on chronic HFpEF Positive cough wheezing increased sputum production with 1 pack plus  per day smoking BNP in the 300s-well above baseline with orthopnea and PND Chest x-ray with?  Streaky opacity at the left base Status post IV Lasix  in the ER Continue torsemide  40 mg p.o. twice daily home dose. IV solumedrol  Duonebs  IV rocephin  and azithromycin  for broadened resp coverage  D-dimer within normal limits Strict ins and outs and daily weights Continue supplemental oxygen Monitor     # Acute on chronic heart failure with preserved ejection fraction (HFpEF)  Echo 05/28/23: EF 50-55% with moderate LVH, Grade III DD and mild MR  Decompensated respiratory status with noted elevated BNP in the 300s (baseline BNP around 100) Status post IV Lasix  in ER Patient unsure about diuretic regimen, though 10/2022 cardiology note with patient being on  torsemide  40mg  daily with additional 40mg  on M, W, F  Continue torsemide  40 mg p.o. twice daily 1/7 Aldactone  and Jardiance  1/8 held Aldactone , torsemide  and lisinopril  due to elevated creatinine Monitor intake and output Will consider cardiology consult if no improvement   # COPD with acute exacerbation (HCC) Positive cough, wheezing, increased sputum production with overlapping CHF exacerbation and decompensated respiratory failure on 2 L CXR w/ noted streaky density in L bases concerning for ? Atlectasis  IV Solu-Medrol  DuoNebs IV Rocephin  and azithromycin  for expanded resp coverage  Continue supplemental oxygen    # CAD (coronary artery disease) Baseline CAD followed by [facility]  RHC/LHC 05/29/23: Moderate, nonobstructive coronary artery disease with sequential 50-60% ostial and proximal LAD stenoses that are not hemodynamically significant Mild chest pressure in setting of CHF and COPD  exacerbations- similar profile from prior evaluations  Trop in 20s today which is baseline  EKG NSR w/ TWI in anterolateral leads which appear to be similar to prior EKGs Continue home regimen including aspirin , Crestor , Zetia     #  Type 2 diabetes mellitus with hyperglycemia Blood sugar in 260s SSI Long-acting insulin  Monitor blood sugars with steroid use   # Gout: Continue allopurinol  uric acid level 10.8 elevated  # Former heavy tobacco smoker Still smoking 1 PPD, smoking cessation counseling done   # Chronic kidney disease (CKD) stage G2/A3, mildly decreased glomerular filtration rate (GFR) between 60-89 mL/min/1.73 square meter and albuminuria creatinine ratio greater than 300 mg/g 1/6 Creatinine 1.27  with GFR greater than 60 1/7 Cr 1.47 slightly elevated, continue to monitor 1/8 creatinine 2.18 elevated, held lisinopril , torsemide  and Aldactone  today. US  renal: Unremarkable, no obstruction.  Urinary bladder is distended. Check bladder scan to rule out urinary retention Monitor   # Essential hypertension Continue Coreg  25 mg p.o. twice daily, lisinopril  40 mg p.o. daily, amlodipine  5 mg p.o. daily Resumed Aldactone  25 mg p.o. daily and to continue torsemide  40 mg p.o. twice daily Monitor BP and titrate medications accordingly 1/8 held lisinopril , Aldactone  and torsemide  today due to elevated creatinine  Obesity, class II Body mass index is 36.26 kg/m.  Interventions: Calorie restricted diet and daily exercise advised to lose body weight.  Lifestyle modification discussed.   Diet: Heart healthy/carb modified DVT Prophylaxis: Subcutaneous Lovenox    Advance goals of care discussion: Full code  Family Communication: family was not present at bedside, at the time of interview.  The pt provided permission to discuss medical plan with the family. Opportunity was given to ask question and all questions were answered satisfactorily.   Disposition:  Pt is from home, admitted with respiratory failure due to COPD and CHF, still has respiratory failure, which precludes a safe discharge. Discharge to home, when stable, may need few days to improve.  Subjective: No significant events overnight, patient feels  improvement in his breathing, denies any complaints, no chest pain or palpitations, no difficulty urination.   Physical Exam: General: NAD, lying comfortably Appear in no distress, affect appropriate Eyes: PERRLA ENT: Oral Mucosa Clear, moist  Neck: no JVD,  Cardiovascular: S1 and S2 Present, no Murmur,  Respiratory: Equal air entry bilaterally, mild bilateral crackles, no wheezing appreciated Abdomen: Bowel Sound present, Soft and no tenderness,  Skin: no rashes Extremities: no Pedal edema, no calf tenderness Neurologic: without any new focal findings Gait not checked due to patient safety concerns  Vitals:   10/22/23 0859 10/22/23 1542 10/22/23 2336 10/23/23 0817  BP:  (!) 143/82 104/65 99/60  Pulse:  84 74 62  Resp:  19 17 18   Temp:  97.8 F (36.6 C) 98.2 F (36.8 C) 97.8 F (36.6 C)  TempSrc:  Oral    SpO2:  96% 96% 94%  Weight: 101.9 kg     Height: 5' 6 (1.676 m)       Intake/Output Summary (Last 24 hours) at 10/23/2023 1310 Last data filed at 10/22/2023 2058 Gross per 24 hour  Intake 240 ml  Output 450 ml  Net -210 ml   Filed Weights   10/21/23 0736 10/22/23 0859  Weight: 104.3 kg 101.9 kg    Data Reviewed: I have personally reviewed and interpreted daily labs, tele strips, imagings as discussed above. I reviewed all nursing notes, pharmacy notes, vitals, pertinent old records I have discussed plan of care as described above with  RN and patient/family.  CBC: Recent Labs  Lab 10/21/23 0735 10/22/23 0523 10/23/23 0248  WBC 9.0 16.1* 14.6*  HGB 16.7 16.0 15.1  HCT 49.5 46.1 45.5  MCV 85.2 83.4 87.0  PLT 222 217 210   Basic Metabolic Panel: Recent Labs  Lab 10/17/23 0944 10/21/23 0735 10/21/23 1647 10/22/23 0523 10/23/23 0248  NA 132* 135  --  136 136  K 4.0 3.4*  --  3.6 4.2  CL 97* 100  --  93* 94*  CO2 23 24  --  27 28  GLUCOSE 390* 265* 385* 288* 294*  BUN 14 13  --  23* 39*  CREATININE 1.33* 1.27*  --  1.47* 2.18*  CALCIUM  8.4* 9.1  --   8.9 8.2*  MG  --   --   --  1.8 2.1  PHOS  --   --   --  3.6 5.0*    Studies: No results found.  Scheduled Meds:  allopurinol   300 mg Oral Daily   amLODipine   5 mg Oral Daily   aspirin  EC  81 mg Oral Daily   carvedilol   25 mg Oral BID AC   DULoxetine   60 mg Oral Daily   And   DULoxetine   30 mg Oral QHS   empagliflozin   10 mg Oral Daily   enoxaparin  (LOVENOX ) injection  0.5 mg/kg Subcutaneous Q24H   ezetimibe   10 mg Oral Daily   insulin  aspart  0-15 Units Subcutaneous TID WC   insulin  aspart  0-5 Units Subcutaneous QHS   insulin  aspart  4 Units Subcutaneous TID WC   insulin  glargine-yfgn  20 Units Subcutaneous Daily   [START ON 10/24/2023] lisinopril   40 mg Oral Daily   loratadine   10 mg Oral QPM   pantoprazole   40 mg Oral Daily   predniSONE   40 mg Oral Q breakfast   pregabalin   50 mg Oral BID   rosuvastatin   40 mg Oral Daily   sodium chloride  flush  3 mL Intravenous Q12H   [START ON 10/24/2023] spironolactone   25 mg Oral Daily   tiotropium  18 mcg Inhalation Daily   [START ON 10/24/2023] torsemide   40 mg Oral BID   Continuous Infusions:  azithromycin  500 mg (10/22/23 1132)   cefTRIAXone  (ROCEPHIN )  IV 1 g (10/23/23 1052)   PRN Meds: acetaminophen , hydrALAZINE , ipratropium-albuterol , ondansetron  **OR** ondansetron  (ZOFRAN ) IV, sodium chloride , sodium chloride  flush, traMADol   Time spent: 35 minutes  Author: ELVAN SOR. MD [facility] 10/23/2023 1:10 PM  To reach On-call, see care teams to locate the attending and reach out to them via [email or web address]. If 7PM-7AM, please contact night-coverage If you still have difficulty reaching the attending provider, please page the [facility] (Director on Call) for [facility] on amion for assistance.

## 2023-10-23 NOTE — Progress Notes (Signed)
 Heart Failure Stewardship Pharmacy Note  PCP: Leavy Mole, PA-C PCP-Cardiologist: Redell Cave, MD  HPI: Cory Rivas is a 58 y.o. male with HTN, HLD, T2DM, CAD, COPD/asthma, dCHF, gout, CKD-2, tobacco abuse, depression who presented with worsening respiratory symptoms after being treated for pneumonia. CXR on admission noted streaky density at the left base compatible with atelectasis. BNP on admission up to 331.6 from 42.4 in clinic 5 days prior. Troponin on admission was 21.   Pertinent cardiac history: Stress test in 09/2020 was low risk with normal LVEF. Echo in 10/2020 with normal LVEF and grade I diastolic dysfunction and mild LVH. Echo in 08/2022 with normal LVEF and grade II diastolic dysfunction. Experienced chest pain 05/2023 and cardiac cath showed moderate nonobstructive CAD, PCWP of 18, CO/CI of 5.1/2.4. Echo at that time with LVEF of 50-55%, grade 3 diastolic dysfunction, moderate LVH.  Pertinent Lab Values: Creat  Date Value Ref Range Status  09/10/2023 1.26 0.70 - 1.30 mg/dL Final   Creatinine, Ser  Date Value Ref Range Status  10/23/2023 2.18 (H) 0.61 - 1.24 mg/dL Final   BUN  Date Value Ref Range Status  10/23/2023 39 (H) 6 - 20 mg/dL Final  91/79/7975 22 6 - 24 mg/dL Final   Potassium  Date Value Ref Range Status  10/23/2023 4.2 3.5 - 5.1 mmol/L Final   Sodium  Date Value Ref Range Status  10/23/2023 136 135 - 145 mmol/L Final  06/04/2023 139 134 - 144 mmol/L Final   Brain Natriuretic Peptide  Date Value Ref Range Status  07/01/2020 65 <100 pg/mL Final    Comment:    . BNP levels increase with age in the general population with the highest values seen in individuals greater than 29 years of age. Reference: J. Am. Penne. Cardiol. 2002; 59:023-017. .    B Natriuretic Peptide  Date Value Ref Range Status  10/21/2023 331.6 (H) 0.0 - 100.0 pg/mL Final    Comment:    Performed at [facility], [address] [place], [place] [address]    Magnesium   Date Value Ref Range Status  10/23/2023 2.1 1.7 - 2.4 mg/dL Final    Comment:    Performed at [facility], [address] [place], [place] [address]   Hemoglobin A1C  Date Value Ref Range Status  01/02/2022 6.5  Final   Hgb A1c MFr Bld  Date Value Ref Range Status  08/09/2023 9.8 (H) <5.7 % of total Hgb Final    Comment:    For someone without known diabetes, a hemoglobin A1c value of 6.5% or greater indicates that they may have  diabetes and this should be confirmed with a follow-up  test. . For someone with known diabetes, a value <7% indicates  that their diabetes is well controlled and a value  greater than or equal to 7% indicates suboptimal  control. A1c targets should be individualized based on  duration of diabetes, age, comorbid conditions, and  other considerations. . Currently, no consensus exists regarding use of hemoglobin A1c for diagnosis of diabetes for children. .    TSH  Date Value Ref Range Status  12/18/2017 1.86 0.41 - 5.90 Final    Vital Signs:  Temp:  [97.8 F (36.6 C)-98.2 F (36.8 C)] 98.2 F (36.8 C) (01/07 2336) Pulse Rate:  [74-95] 74 (01/07 2336) Cardiac Rhythm: Normal sinus rhythm (01/07 2030) Resp:  [17-19] 17 (01/07 2336) BP: (104-151)/(65-98) 104/65 (01/07 2336) SpO2:  [96 %-97 %] 96 % (01/07 2336) Weight:  [  101.9 kg (224 lb 10.4 oz)] 101.9 kg (224 lb 10.4 oz) (01/07 0859)  Intake/Output Summary (Last 24 hours) at 10/23/2023 0725 Last data filed at 10/22/2023 2058 Gross per 24 hour  Intake 240 ml  Output 650 ml  Net -410 ml    Current Heart Failure Medications:  Loop diuretic: torsemide  40 mg BID Beta-Blocker: none ACEI/ARB/ARNI: none MRA: none SGLT2i: none  Prior to admission Heart Failure Medications:  Loop diuretic: torsemide  40 mg daily and an additional 40 mg on MWF Beta-Blocker: carvedilol  25 mg BID ACEI/ARB/ARNI: lisinopril  40 mg daily MRA: Spironolactone  25 mg daily SGLT2i: Jardiance  10  mg daily Other vasoactive medications include amlodipine  5 mg daily  Assessment: 1. Acute on chronic diastolic heart failure (LVEF 50-55%) with grade III diastolic dysfunction, due to NICM. NYHA class III symptoms.  -Symptoms: Reports significant improvement to shortness of breath as well as orthopnea. Coughing improved. No significant LEE. Feels back to baseline. -Volume: Volume is difficult to assess due to body habitus. Currently on torsemide  40 mg BID. AKI present today. Already received torsemide  and BP medications this AM. Will likely require holding torsemide  tomorrow as well. -Hemodynamics: BP elevated 140-180s/90s. HR 90s. -AA:Rlmmzwuob held, can resume prior to discharge if BP allows -ACEI/ARB/ARNI: Lisinopril  40 mg already given this AM. May require dose reduction if BP and creatinine remain abnormal. -MRA: Spironolactone  given this AM. May require holding tomorrow if creatinine does not improve. -SGLT2i: Continue Jardiance . -Suspect AKI is due to hypovolemia. May still require brief holding or dose reduction of BP medications.  Plan: 1) Medication changes recommended at this time: -Consider holding amlodipine  given lower BP and no benefit for CHF. -Agree with holding torsemide  the rest of the day. Will likely need to hold tomorrow as well.  2) Patient assistance: -None  3) Education: - Patient has been educated on current HF medications and potential additions to HF medication regimen - Patient verbalizes understanding that over the next few months, these medication doses may change and more medications may be added to optimize HF regimen - Patient has been educated on basic disease state pathophysiology and goals of therapy  Medication Assistance / Insurance Benefits Check: Does the patient have prescription insurance?    Type of insurance plan:  Does the patient qualify for medication assistance through manufacturers or grants? no  Outpatient Pharmacy: Prior to  admission outpatient pharmacy: [facility] pharmacy     Please do not hesitate to reach out with questions or concerns,  Cory Rivas, PharmD, CPP, BCPS Heart Failure Pharmacist  Phone - [phone] 10/23/2023 7:25 AM

## 2023-10-23 NOTE — Plan of Care (Signed)
 Patient ID: RAYBURN MUNDIS, male   DOB: [birth year], 58 y.o.   MRN: [id number]  Problem: Education: Goal: Ability to describe self-care measures that may prevent or decrease complications (Diabetes Survival Skills Education) will improve Outcome: Progressing Goal: Individualized Educational Video(s) Outcome: Progressing   Problem: Coping: Goal: Ability to adjust to condition or change in health will improve Outcome: Progressing   Problem: Fluid Volume: Goal: Ability to maintain a balanced intake and output will improve Outcome: Progressing   Problem: Health Behavior/Discharge Planning: Goal: Ability to identify and utilize available resources and services will improve Outcome: Progressing Goal: Ability to manage health-related needs will improve Outcome: Progressing   Problem: Metabolic: Goal: Ability to maintain appropriate glucose levels will improve Outcome: Progressing   Problem: Nutritional: Goal: Maintenance of adequate nutrition will improve Outcome: Progressing Goal: Progress toward achieving an optimal weight will improve Outcome: Progressing   Problem: Skin Integrity: Goal: Risk for impaired skin integrity will decrease Outcome: Progressing   Problem: Tissue Perfusion: Goal: Adequacy of tissue perfusion will improve Outcome: Progressing   Problem: Education: Goal: Knowledge of General Education information will improve Description: Including pain rating scale, medication(s)/side effects and non-pharmacologic comfort measures Outcome: Progressing   Problem: Health Behavior/Discharge Planning: Goal: Ability to manage health-related needs will improve Outcome: Progressing   Problem: Clinical Measurements: Goal: Ability to maintain clinical measurements within normal limits will improve Outcome: Progressing Goal: Will remain free from infection Outcome: Progressing Goal: Diagnostic test results will improve Outcome: Progressing Goal: Respiratory  complications will improve Outcome: Progressing Goal: Cardiovascular complication will be avoided Outcome: Progressing   Problem: Activity: Goal: Risk for activity intolerance will decrease Outcome: Progressing   Problem: Nutrition: Goal: Adequate nutrition will be maintained Outcome: Progressing   Problem: Coping: Goal: Level of anxiety will decrease Outcome: Progressing   Problem: Elimination: Goal: Will not experience complications related to bowel motility Outcome: Progressing Goal: Will not experience complications related to urinary retention Outcome: Progressing   Problem: Pain Management: Goal: General experience of comfort will improve Outcome: Progressing   Problem: Safety: Goal: Ability to remain free from injury will improve Outcome: Progressing   Problem: Skin Integrity: Goal: Risk for impaired skin integrity will decrease Outcome: Progressing   Problem: Education: Goal: Ability to demonstrate management of disease process will improve Outcome: Progressing Goal: Ability to verbalize understanding of medication therapies will improve Outcome: Progressing Goal: Individualized Educational Video(s) Outcome: Progressing   Problem: Activity: Goal: Capacity to carry out activities will improve Outcome: Progressing   Problem: Cardiac: Goal: Ability to achieve and maintain adequate cardiopulmonary perfusion will improve Outcome: Progressing   Problem: Education: Goal: Knowledge of disease or condition will improve Outcome: Progressing Goal: Knowledge of the prescribed therapeutic regimen will improve Outcome: Progressing Goal: Individualized Educational Video(s) Outcome: Progressing   Problem: Activity: Goal: Ability to tolerate increased activity will improve Outcome: Progressing Goal: Will verbalize the importance of balancing activity with adequate rest periods Outcome: Progressing   Problem: Respiratory: Goal: Ability to maintain a clear  airway will improve Outcome: Progressing Goal: Levels of oxygenation will improve Outcome: Progressing Goal: Ability to maintain adequate ventilation will improve Outcome: Progressing    Verdie JONETTA Collier, RN

## 2023-10-24 ENCOUNTER — Other Ambulatory Visit: Payer: Self-pay

## 2023-10-24 ENCOUNTER — Encounter: Payer: Self-pay | Admitting: Physician Assistant

## 2023-10-24 ENCOUNTER — Encounter: Payer: Medicaid Other | Admitting: Family

## 2023-10-24 ENCOUNTER — Other Ambulatory Visit (HOSPITAL_COMMUNITY): Payer: Self-pay

## 2023-10-24 ENCOUNTER — Encounter: Payer: Self-pay | Admitting: Family Medicine

## 2023-10-24 ENCOUNTER — Telehealth (HOSPITAL_COMMUNITY): Payer: Self-pay | Admitting: Pharmacy Technician

## 2023-10-24 DIAGNOSIS — E1165 Type 2 diabetes mellitus with hyperglycemia: Secondary | ICD-10-CM

## 2023-10-24 DIAGNOSIS — J9601 Acute respiratory failure with hypoxia: Secondary | ICD-10-CM | POA: Diagnosis not present

## 2023-10-24 LAB — MAGNESIUM: Magnesium: 2.2 mg/dL (ref 1.7–2.4)

## 2023-10-24 LAB — BASIC METABOLIC PANEL
Anion gap: 13 (ref 5–15)
BUN: 45 mg/dL — ABNORMAL HIGH (ref 6–20)
CO2: 26 mmol/L (ref 22–32)
Calcium: 8 mg/dL — ABNORMAL LOW (ref 8.9–10.3)
Chloride: 96 mmol/L — ABNORMAL LOW (ref 98–111)
Creatinine, Ser: 1.99 mg/dL — ABNORMAL HIGH (ref 0.61–1.24)
GFR, Estimated: 39 mL/min — ABNORMAL LOW (ref >60)
Glucose, Bld: 189 mg/dL — ABNORMAL HIGH (ref 70–99)
Potassium: 3.6 mmol/L (ref 3.5–5.1)
Sodium: 135 mmol/L (ref 135–145)

## 2023-10-24 LAB — CULTURE, RESPIRATORY W GRAM STAIN

## 2023-10-24 LAB — CBC
HCT: 45 % (ref 39.0–52.0)
Hemoglobin: 15.4 g/dL (ref 13.0–17.0)
MCH: 29.4 pg (ref 26.0–34.0)
MCHC: 34.2 g/dL (ref 30.0–36.0)
MCV: 85.9 fL (ref 80.0–100.0)
Platelets: 198 10*3/uL (ref 150–400)
RBC: 5.24 MIL/uL (ref 4.22–5.81)
RDW: 13.2 % (ref 11.5–15.5)
WBC: 14.1 10*3/uL — ABNORMAL HIGH (ref 4.0–10.5)
nRBC: 0 % (ref 0.0–0.2)

## 2023-10-24 LAB — PHOSPHORUS: Phosphorus: 4.8 mg/dL — ABNORMAL HIGH (ref 2.5–4.6)

## 2023-10-24 LAB — GLUCOSE, CAPILLARY
Glucose-Capillary: 263 mg/dL — ABNORMAL HIGH (ref 70–99)
Glucose-Capillary: 108 mg/dL — ABNORMAL HIGH (ref 70–99)

## 2023-10-24 MED ORDER — TORSEMIDE 20 MG PO TABS: 40.0000 mg | ORAL_TABLET | Freq: Two times a day (BID) | ORAL | Status: DC

## 2023-10-24 MED ORDER — BUDESONIDE-FORMOTEROL FUMARATE 160-4.5 MCG/ACT IN AERO: 2.0000 | INHALATION_SPRAY | Freq: Two times a day (BID) | RESPIRATORY_TRACT | 12 refills | Status: DC

## 2023-10-24 MED ORDER — LISINOPRIL 20 MG PO TABS: 40.0000 mg | ORAL_TABLET | Freq: Every day | ORAL | Status: DC

## 2023-10-24 MED ORDER — FLUTICASONE FUROATE-VILANTEROL 200-25 MCG/ACT IN AEPB
1.0000 | INHALATION_SPRAY | Freq: Every day | RESPIRATORY_TRACT | 1 refills | Status: DC
  Filled 2023-10-24 (×2): qty 60, 60d supply, fill #0

## 2023-10-24 MED ORDER — BUDESONIDE-FORMOTEROL FUMARATE 160-4.5 MCG/ACT IN AERO
2.0000 | INHALATION_SPRAY | Freq: Two times a day (BID) | RESPIRATORY_TRACT | 12 refills | Status: DC
  Filled 2023-10-24: qty 1, fill #0

## 2023-10-24 MED ORDER — ORAL CARE MOUTH RINSE: 15.0000 mL | OROMUCOSAL | Status: DC | PRN

## 2023-10-24 MED ORDER — AZITHROMYCIN 500 MG PO TABS
500.0000 mg | ORAL_TABLET | Freq: Every day | ORAL | Status: DC
Start: 2023-10-25 — End: 2023-10-24

## 2023-10-24 MED ORDER — SPIRONOLACTONE 25 MG PO TABS: 25.0000 mg | ORAL_TABLET | Freq: Every day | ORAL | Status: DC

## 2023-10-24 NOTE — Progress Notes (Signed)
 Heart Failure Stewardship Pharmacy Note  PCP: Leavy Mole, PA-C PCP-Cardiologist: Redell Cave, MD  HPI: Cory Rivas is a 58 y.o. male with HTN, HLD, T2DM, CAD, COPD/asthma, dCHF, gout, CKD-2, tobacco abuse, depression who presented with worsening respiratory symptoms after being treated for pneumonia. CXR on admission noted streaky density at the left base compatible with atelectasis. BNP on admission up to 331.6 from 42.4 in clinic 5 days prior. Troponin on admission was 21.   Pertinent cardiac history: Stress test in 09/2020 was low risk with normal LVEF. Echo in 10/2020 with normal LVEF and grade I diastolic dysfunction and mild LVH. Echo in 08/2022 with normal LVEF and grade II diastolic dysfunction. Experienced chest pain 05/2023 and cardiac cath showed moderate nonobstructive CAD, PCWP of 18, CO/CI of 5.1/2.4. Echo at that time with LVEF of 50-55%, grade 3 diastolic dysfunction, moderate LVH.  Pertinent Lab Values: Creat  Date Value Ref Range Status  09/10/2023 1.26 0.70 - 1.30 mg/dL Final   Creatinine, Ser  Date Value Ref Range Status  10/24/2023 1.99 (H) 0.61 - 1.24 mg/dL Final   BUN  Date Value Ref Range Status  10/24/2023 45 (H) 6 - 20 mg/dL Final  91/79/7975 22 6 - 24 mg/dL Final   Potassium  Date Value Ref Range Status  10/24/2023 3.6 3.5 - 5.1 mmol/L Final   Sodium  Date Value Ref Range Status  10/24/2023 135 135 - 145 mmol/L Final  06/04/2023 139 134 - 144 mmol/L Final   Brain Natriuretic Peptide  Date Value Ref Range Status  07/01/2020 65 <100 pg/mL Final    Comment:    . BNP levels increase with age in the general population with the highest values seen in individuals greater than 31 years of age. Reference: J. Am. Penne. Cardiol. 2002; 59:023-017. .    B Natriuretic Peptide  Date Value Ref Range Status  10/21/2023 331.6 (H) 0.0 - 100.0 pg/mL Final    Comment:    Performed at [facility], [address] [place], [place] [address]    Magnesium   Date Value Ref Range Status  10/24/2023 2.2 1.7 - 2.4 mg/dL Final    Comment:    Performed at [facility], [address] [place], [place] [address]   Hemoglobin A1C  Date Value Ref Range Status  01/02/2022 6.5  Final   Hgb A1c MFr Bld  Date Value Ref Range Status  08/09/2023 9.8 (H) <5.7 % of total Hgb Final    Comment:    For someone without known diabetes, a hemoglobin A1c value of 6.5% or greater indicates that they may have  diabetes and this should be confirmed with a follow-up  test. . For someone with known diabetes, a value <7% indicates  that their diabetes is well controlled and a value  greater than or equal to 7% indicates suboptimal  control. A1c targets should be individualized based on  duration of diabetes, age, comorbid conditions, and  other considerations. . Currently, no consensus exists regarding use of hemoglobin A1c for diagnosis of diabetes for children. .    TSH  Date Value Ref Range Status  12/18/2017 1.86 0.41 - 5.90 Final    Vital Signs:  Temp:  [97.8 F (36.6 C)-98.9 F (37.2 C)] 98 F (36.7 C) (01/08 2307) Pulse Rate:  [55-62] 55 (01/08 2307) Cardiac Rhythm: Sinus bradycardia (01/08 1930) Resp:  [18-20] 20 (01/08 2307) BP: (99-108)/(60-67) 106/67 (01/08 2307) SpO2:  [91 %-94 %] 92 % (01/08 2307) Weight:  [  102.7 kg (226 lb 6.6 oz)-104.3 kg (229 lb 15 oz)] 102.7 kg (226 lb 6.6 oz) (01/09 0500)  Intake/Output Summary (Last 24 hours) at 10/24/2023 0716 Last data filed at 10/24/2023 0636 Gross per 24 hour  Intake 480 ml  Output 1000 ml  Net -520 ml    Current Heart Failure Medications:  Loop diuretic: torsemide  40 mg BID Beta-Blocker: none ACEI/ARB/ARNI: none MRA: none SGLT2i: none  Prior to admission Heart Failure Medications:  Loop diuretic: torsemide  40 mg daily and an additional 40 mg on MWF Beta-Blocker: carvedilol  25 mg BID ACEI/ARB/ARNI: lisinopril  40 mg daily MRA: Spironolactone  25 mg  daily SGLT2i: Jardiance  10 mg daily Other vasoactive medications include amlodipine  5 mg daily  Assessment: 1. Acute on chronic diastolic heart failure (LVEF 50-55%) with grade III diastolic dysfunction, due to NICM. NYHA class II symptoms.  -Symptoms: Reports shortness of breath and orthopnea is improved. No apparent LEE.  -Volume: Hypovolemic still today. Creatinine is trending down. Torsemide , spironolactone  were given this AM, but further doses postponed to 1/11. Agree with holding, will likely be fine to resume medications at that time. -Hemodynamics: BP improved this AM. HR 70s. -BB: Carvedilol  resumed at target dose. HR is on lower end, will monitor. -ACEI/ARB/ARNI: Lisinopril  retimed for 1/11, though already given this AM. Will monitor trend. Suspect AKI is due to hypovolemia more than ACEi, but may require dose reduction if creatinine does not trend down. -MRA: Continue spironolactone . Creatinine bumped, suspect due to hypovolemia.  -SGLT2i: Continue Jardiance .   Plan: 1) Medication changes recommended at this time: -Consider stopping amlodipine  -Agree with holding diuresis 2 days. Can consider resuming home regimen 10/25/22 with close outpatient follow up.  2) Patient assistance: -None  3) Education: - Patient has been educated on current HF medications and potential additions to HF medication regimen - Patient verbalizes understanding that over the next few months, these medication doses may change and more medications may be added to optimize HF regimen - Patient has been educated on basic disease state pathophysiology and goals of therapy  Medication Assistance / Insurance Benefits Check: Does the patient have prescription insurance?    Type of insurance plan:  Does the patient qualify for medication assistance through manufacturers or grants? no  Outpatient Pharmacy: Prior to admission outpatient pharmacy: [facility] pharmacy     Please do not hesitate to reach out  with questions or concerns,  Jaun Bash, PharmD, CPP, BCPS Heart Failure Pharmacist  Phone - [phone] 10/24/2023 7:16 AM

## 2023-10-24 NOTE — Telephone Encounter (Signed)
 Patient Product/process Development Scientist completed.    The patient is insured through [facility].     Ran test claim for Dulera  200 and the current 30 day co-pay is $4.00.  Ran test claim for Breo Ellipta  200-25 mcg and Prior Authorization Required  This test claim was processed through [employer]- copay amounts may vary at other pharmacies due to [employer], or as the patient moves through the different stages of their insurance plan.     Reyes Sharps, CPHT Pharmacy Technician III Certified Patient Advocate [facility] Pharmacy Patient Advocate Team Direct Number: [phone]  Fax: [phone]

## 2023-10-24 NOTE — Discharge Summary (Signed)
 [facility] Discharge Summary   Patient: Cory Rivas FMW:[id number]  PCP: Cory Mole, PA-C  Date of admission: 10/21/2023   Date of discharge:  10/24/2023     Discharge Diagnoses:  Principal Problem:   Acute respiratory failure with hypoxia (HCC) Active Problems:   Acute on chronic heart failure with preserved ejection fraction (HFpEF) (HCC)   COPD with acute exacerbation (HCC)   Type 2 diabetes mellitus with hyperglycemia (HCC)   CAD (coronary artery disease)   Gout   Essential hypertension   Chronic kidney disease (CKD) stage G2/A3, mildly decreased glomerular filtration rate (GFR) between 60-89 mL/min/1.73 square meter and albuminuria creatinine ratio greater than 300 mg/g   Former heavy tobacco smoker   Admitted From: Home Disposition:  Home   Recommendations for Outpatient Follow-up:  Follow-up with PCP in 1 week, continue monitor BP and follow with PCP to titrate medication accordingly.  Due to elevated creatinine, held lisinopril , torsemide  and spironolactone  for few days, repeat BMP on Monday next week to check renal functions and then restart medications. Follow-up with cardiology in 1 week. Follow up LABS/TEST:  BMP on Monday 10/28/23   Follow-up Information     [facility] Follow up in 7 day(s).   Specialty: Cardiology Why: Hospital-Follow Up 10/30/23 @ 9:00AM Please bring all meds to appointment [facility], [address], Second Floor Free Valet Parking at the [employer] information: [address] [place]  [phone]        Cory Mole, PA-C Follow up in 1 week(s).   Specialty: Family Medicine Why: BMP next week on Monday 10/28/2023 Contact information: [address] [place] [address] [phone]                Diet recommendation: Cardiac and Carb modified diet  Activity: The patient is advised to gradually reintroduce usual  activities, as tolerated  Discharge Condition: stable  Code Status: Full code   History of present illness: As per the H and P dictated on admission.  Hospital Course:  Cory Rivas is a 58 y.o. male with medical history significant of HTN, HLD, DM, CAD, COPD/asthma, dCHF, gout, CKD-2, tobacco abuse, depression presenting with acute respiratory failure with hypoxia, COPD exacerbation, acute on chronic HFpEF, hyperglycemia.  Patient reports increased work of breathing over the past 2 to 3 days.  Positive mild central chest pressure.  Positive cough, wheezing, increased sputum production.  Positive orthopnea PND.  Still smoking 1 pack/day.  Noted had been admitted November 2024 for issues including hypertensive urgency.  Also had cardiology follow-up January 2.  Patient states he is unclear about his diuretic/medication regimen at present.  Does admit to eating salty foods.  Denies any alcohol use.  No fevers or chills.  No abdominal pain or diarrhea.  No focal hemiparesis or confusion.  Reports recently being treated for pneumonia.  Patient still smoking 1 pack/day despite symptoms. Presented to the ER afebrile, blood pressure 170s to 190s over 80s to 100s.  Satting 87% on room air.  Transition to 2 L nasal cannula to keep O2 sats greater than 91%.  White count 9, hemoglobin 16.7, platelets 222, D-dimer 0.31, COVID flu and RSV negative.  Creatinine 1.27, glucose 265, potassium 3.4.  Troponin 21.  BNP 332.  Chest x-ray with streaky density at the left base compatible atelectasis.     Assessment and Plan:   # Acute respiratory failure with hypoxia due to COPD  vs CHF exacerbation Acute respiratory failure with hypoxia requiring 2 L on presentation Likely multifactorial contributions of body habitus, COPD as well as acute on chronic HFpEF. Positive cough wheezing increased sputum production with 1 pack plus per day smoking BNP in the 300s-well above baseline with orthopnea and PND Chest x-ray with?   Streaky opacity at the left base Status post IV Lasix  in the ER S/p torsemide  40 mg p.o. twice daily home dose. S/p IV solumedrol, Duonebs. S/p IV rocephin  and azithromycin  for broadened resp coverage. D-dimer within normal limits Hypoxia resolved, currently saturating well on room air.   # Acute on chronic heart failure with preserved ejection fraction (HFpEF)  Echo 05/28/23: EF 50-55% with moderate LVH, Grade III DD and mild MR  Decompensated respiratory status with noted elevated BNP in the 300s (baseline BNP around 100). Status post IV Lasix  in ER Patient unsure about diuretic regimen, though 10/2022 cardiology note with patient being on  torsemide  40mg  daily with additional 40mg  on M, W, F  S/p Torsemide  40 mg p.o. twice daily 1/7 Aldactone  and Jardiance  1/8 held Aldactone , torsemide  and lisinopril  due to elevated creatinine.  Patient was feeling fine, no symptoms, wanted to go home.  Patient was advised to hold torsemide , Aldactone  and lisinopril  until Monday and repeat BMP to check renal functions and then restart medications.  Recommended to follow PCP and cardiology as an outpatient.     # COPD with acute exacerbation: Positive cough, wheezing, increased sputum production with overlapping CHF exacerbation and decompensated respiratory failure on 2 L CXR w/ noted streaky density in L bases concerning for ? Atlectasis. S/p IV Solu-Medrol , DuoNebs. S/p IV Rocephin  and azithromycin  for expanded resp coverage.  Patient was advised to continue Breo Ellipta  inhaler, DuoNeb nebulizer at home, continued Spiriva .  Continue albuterol  as needed.  Follow-up with PCP and pulmonologist.   # CAD (coronary artery disease) Baseline CAD followed by [facility]  RHC/LHC 05/29/23: Moderate, nonobstructive coronary artery disease with sequential 50-60% ostial and proximal LAD stenoses that are not hemodynamically significant Mild chest pressure in setting of CHF and COPD exacerbations- similar profile  from prior evaluations  Trop in 20s today which is baseline  EKG NSR w/ TWI in anterolateral leads which appear to be similar to prior EKGs Continue home regimen including aspirin , Crestor , Zetia      # Type 2 diabetes mellitus with hyperglycemia Resumed home medications on discharge.  Patient was on Trulicity , Lantus , Humalog  and metformin .  Monitor CBG at home, continue diabetic diet. # Gout: Continue allopurinol . uric acid level 10.8 elevated # Former heavy tobacco smoker Still smoking 1 PPD, smoking cessation counseling done  # Chronic kidney disease (CKD) stage G2/A3, mildly decreased glomerular filtration rate (GFR) between 60-89 mL/min/1.73 square meter and albuminuria creatinine ratio greater than 300 mg/g 1/6 Creatinine 1.27  with GFR greater than 60 1/7 Cr 1.47 slightly elevated, continue to monitor 1/8 creatinine 2.18 elevated, held lisinopril , torsemide  and Aldactone  today. US  renal: Unremarkable, no obstruction.  Urinary bladder is distended.  Bladder scan ruled out urinary retention.   # Essential hypertension Continue Coreg  25 mg p.o. twice daily, lisinopril  40 mg p.o. daily, amlodipine  5 mg p.o. daily Resumed Aldactone  25 mg p.o. daily and to continue torsemide  40 mg p.o. twice daily Monitor BP and titrate medications accordingly 1/8 held lisinopril , Aldactone  and torsemide  today due to elevated creatinine   Obesity, class II Body mass index is 36.54 kg/m.  Interventions: Calorie restricted diet and daily exercise advised to lose  body weight.  Lifestyle modification discussed.  - Patient was instructed, not to drive, operate heavy machinery, perform activities at heights, swimming or participation in water activities or provide baby sitting services while on Pain, Sleep and Anxiety Medications; until his outpatient Physician has advised to do so again.  - Also recommended to not to take more than prescribed Pain, Sleep and Anxiety Medications.  Patient was ambulatory  without any assistance. On the day of the discharge the patient's vitals were stable, and no other acute medical condition were reported by patient. the patient was felt safe to be discharge at Home.  Consultants: None Procedures: none  Discharge Exam: General: Appear in no distress, no Rash; Oral Mucosa Clear, moist. Cardiovascular: S1 and S2 Present, no Murmur, Respiratory: normal respiratory effort, Bilateral Air entry present and no Crackles, no wheezes Abdomen: Bowel Sound present, Soft and no tenderness, no hernia Extremities: no Pedal edema, no calf tenderness Neurology: alert and oriented to time, place, and person affect appropriate.  Filed Weights   10/22/23 0859 10/23/23 1700 10/24/23 0500  Weight: 101.9 kg 104.3 kg 102.7 kg   Vitals:   10/23/23 2307 10/24/23 0911  BP: 106/67 105/65  Pulse: (!) 55 (!) 56  Resp: 20 18  Temp: 98 F (36.7 C) 98.1 F (36.7 C)  SpO2: 92% 93%    DISCHARGE MEDICATION: Allergies as of 10/24/2023       Reactions   Oxycodone  Nausea And Vomiting   Entresto  [sacubitril -valsartan ] Nausea Only, Other (See Comments)   Lightheaded and dizzy    Onion Nausea And Vomiting   Nitroglycerin  Itching, Rash        Medication List     STOP taking these medications    ondansetron  4 MG disintegrating tablet Commonly known as: ZOFRAN -ODT   predniSONE  10 MG tablet Commonly known as: DELTASONE    promethazine -dextromethorphan 6.25-15 MG/5ML syrup Commonly known as: PROMETHAZINE -DM       TAKE these medications    allopurinol  300 MG tablet Commonly known as: ZYLOPRIM  TAKE ONE TABLET BY MOUTH DAILY WITH 100 MG TABLET. FOR A TOTAL OF 400 MG DAILY FOR GOUT   amLODipine  5 MG tablet Commonly known as: NORVASC  Take 1 tablet (5 mg total) by mouth daily.   aspirin  EC 81 MG tablet Take 1 tablet (81 mg total) by mouth daily. Swallow whole.   benzonatate  100 MG capsule Commonly known as: TESSALON  Take 2 capsules (200 mg total) by mouth 2 (two)  times daily as needed for cough.   carvedilol  25 MG tablet Commonly known as: COREG  Take 1 tablet (25 mg total) by mouth 2 (two) times daily.   DULoxetine  30 MG capsule Commonly known as: CYMBALTA  Take 60 mg (2 capsules) poqam and 30 mg (1 capsule) poqpm daily   empagliflozin  10 MG Tabs tablet Commonly known as: Jardiance  Take 1 tablet (10 mg total) by mouth daily.   ezetimibe  10 MG tablet Commonly known as: ZETIA  TAKE ONE TABLET BY MOUTH DAILY   fluticasone  50 MCG/ACT nasal spray Commonly known as: FLONASE  Place 1 spray into both nostrils daily.   fluticasone  furoate-vilanterol 200-25 MCG/ACT Aepb Commonly known as: Breo Ellipta  Inhale 1 puff into the lungs daily.   insulin  lispro 100 UNIT/ML KwikPen Commonly known as: HUMALOG  Inject 8 Units into the skin 3 (three) times daily. Sliding scale up to 45 units per injection   ipratropium-albuterol  0.5-2.5 (3) MG/3ML Soln Commonly known as: DUONEB Take 3 mLs by nebulization every 6 (six) hours as needed (SOB wheeze cough).  Lantus  SoloStar 100 UNIT/ML Solostar Pen Generic drug: insulin  glargine Inject 20 Units into the skin at bedtime.   levocetirizine 5 MG tablet Commonly known as: XYZAL  TAKE ONE TABLET BY MOUTH EVERY EVENING   lisinopril  40 MG tablet Commonly known as: ZESTRIL  Take 1 tablet (40 mg total) by mouth daily. Start taking on: October 29, 2023 What changed: These instructions start on October 29, 2023. If you are unsure what to do until then, ask your doctor or other care provider.   metformin  500 MG (OSM) 24 hr tablet Commonly known as: FORTAMET  Take 2,000 mg by mouth at bedtime.   metFORMIN  500 MG 24 hr tablet Commonly known as: GLUCOPHAGE -XR Take 1,000 mg by mouth 2 (two) times daily.   nitroGLYCERIN  0.4 MG SL tablet Commonly known as: NITROSTAT  Place 1 tablet (0.4 mg total) under the tongue every 5 (five) minutes as needed for chest pain.   pantoprazole  40 MG tablet Commonly known as:  PROTONIX  Take 1 tablet (40 mg total) by mouth daily.   pregabalin  50 MG capsule Commonly known as: LYRICA  TAKE ONE CAPSULE (50 MG TOTAL) BY MOUTH TWO TIMES DAILY.   rosuvastatin  40 MG tablet Commonly known as: CRESTOR  TAKE ONE TABLET BY MOUTH DAILY   Spiriva  Respimat 2.5 MCG/ACT Aers Generic drug: Tiotropium Bromide  Monohydrate Inhale 2 puffs into the lungs daily.   spironolactone  25 MG tablet Commonly known as: ALDACTONE  Take 1 tablet (25 mg total) by mouth daily. Start taking on: October 29, 2023 What changed: These instructions start on October 29, 2023. If you are unsure what to do until then, ask your doctor or other care provider.   Torsemide  40 MG Tabs Take 40 mg by mouth daily. Take 1 additional 40 mg tablet on Mon, Wed, and Friday morning. Start taking on: October 29, 2023 What changed: These instructions start on October 29, 2023. If you are unsure what to do until then, ask your doctor or other care provider.   Trulicity  3 MG/0.5ML Soaj Generic drug: Dulaglutide  Inject 3 mg into the skin once a week. SATURDAYS   Ventolin  HFA 108 (90 Base) MCG/ACT inhaler Generic drug: albuterol  INHALE TWO PUFFS INTO THE LUNGS EVERY SIX HOURS AS NEEDED FOR WHEEZING OR SHORTNESS OF BREATH       Allergies  Allergen Reactions   Oxycodone  Nausea And Vomiting   Entresto  [Sacubitril -Valsartan ] Nausea Only and Other (See Comments)    Lightheaded and dizzy    Onion Nausea And Vomiting   Nitroglycerin  Itching and Rash   Discharge Instructions     Call MD for:   Complete by: As directed    Worsening of shortness of breath, and/or lower extremity edema.   Call MD for:  difficulty breathing, headache or visual disturbances   Complete by: As directed    Call MD for:  extreme fatigue   Complete by: As directed    Call MD for:  persistant dizziness or light-headedness   Complete by: As directed    Call MD for:  persistant nausea and vomiting   Complete by: As directed    Call MD  for:  severe uncontrolled pain   Complete by: As directed    Call MD for:  temperature >100.4   Complete by: As directed    Diet - low sodium heart healthy   Complete by: As directed    Discharge instructions   Complete by: As directed    Follow-up with PCP in 1 week, continue monitor BP and follow with PCP to  titrate medication accordingly.  Due to elevated creatinine, held lisinopril , torsemide  and spironolactone  for few days, repeat BMP on Monday next week to check renal functions and then restart medications. Follow-up with cardiology in 1 week.   Increase activity slowly   Complete by: As directed        The results of significant diagnostics from this hospitalization (including imaging, microbiology, ancillary and laboratory) are listed below for reference.    Significant Diagnostic Studies: US  RENAL Result Date: 10/23/2023 CLINICAL DATA:  Acute renal insufficiency. EXAM: RENAL / URINARY TRACT ULTRASOUND COMPLETE COMPARISON:  Ultrasound dated 05/15/2019 FINDINGS: Right Kidney: Renal measurements: 11.3 x 4.5 x 5.1 cm = volume: 134 mL. Normal echogenicity. No hydronephrosis or shadowing stone. Left Kidney: Renal measurements: 10.4 x 5.7 x 4.8 cm = volume: 150 mL. Normal echogenicity. No hydronephrosis or shadowing stone. Bladder: Appears normal for degree of bladder distention. Bilateral ureteral jets noted. Other: Increased echogenicity of the visualized liver most consistent with fatty infiltration. IMPRESSION: 1. Unremarkable kidneys and urinary bladder. 2. Fatty liver. Electronically Signed   By: Vanetta Chou M.D.   On: 10/23/2023 13:09   DG Chest 2 View Result Date: 10/21/2023 CLINICAL DATA:  Congestive heart failure. Shortness of breath. Chest pain EXAM: CHEST - 2 VIEW COMPARISON:  08/31/2023 FINDINGS: The lungs are clear without focal pneumonia, edema, pneumothorax or pleural effusion. The cardiopericardial silhouette is within normal limits for size. No acute bony abnormality.  IMPRESSION: No active cardiopulmonary disease. Electronically Signed   By: Camellia Candle M.D.   On: 10/21/2023 08:02   DG Chest 2 View Result Date: 10/21/2023 CLINICAL DATA:  Shortness of breath. Chest pain. Nausea and vomiting. EXAM: CHEST - 2 VIEW COMPARISON:  10/17/2023 FINDINGS: Streaky density at the left base is compatible with atelectasis. Right lung clear. Interstitial markings are diffusely coarsened with chronic features. The cardiopericardial silhouette is within normal limits for size. No acute bony abnormality. IMPRESSION: Streaky density at the left base is compatible with atelectasis. Electronically Signed   By: Camellia Candle M.D.   On: 10/21/2023 07:58    Microbiology: Recent Results (from the past 240 hours)  Resp panel by RT-PCR (RSV, Flu A&B, Covid) Anterior Nasal Swab     Status: None   Collection Time: 10/21/23  8:39 AM   Specimen: Anterior Nasal Swab  Result Value Ref Range Status   SARS Coronavirus 2 by RT PCR NEGATIVE NEGATIVE Final    Comment: (NOTE) SARS-CoV-2 target nucleic acids are NOT DETECTED.  The SARS-CoV-2 RNA is generally detectable in upper respiratory specimens during the acute phase of infection. The lowest concentration of SARS-CoV-2 viral copies this assay can detect is 138 copies/mL. A negative result does not preclude SARS-Cov-2 infection and should not be used as the sole basis for treatment or other patient management decisions. A negative result may occur with  improper specimen collection/handling, submission of specimen other than nasopharyngeal swab, presence of viral mutation(s) within the areas targeted by this assay, and inadequate number of viral copies(<138 copies/mL). A negative result must be combined with clinical observations, patient history, and epidemiological information. The expected result is Negative.  Fact Sheet for Patients:  bloggercourse.com  Fact Sheet for Healthcare Providers:   seriousbroker.it  This test is no t yet approved or cleared by the [place]  FDA and  has been authorized for detection and/or diagnosis of SARS-CoV-2 by FDA under an Emergency Use Authorization (EUA). This EUA will remain  in effect (meaning this test can be used) for  the duration of the COVID-19 declaration under Section 564(b)(1) of the Act, 21 U.S.C.section 360bbb-3(b)(1), unless the authorization is terminated  or revoked sooner.       Influenza A by PCR NEGATIVE NEGATIVE Final   Influenza B by PCR NEGATIVE NEGATIVE Final    Comment: (NOTE) The Xpert Xpress SARS-CoV-2/FLU/RSV plus assay is intended as an aid in the diagnosis of influenza from Nasopharyngeal swab specimens and should not be used as a sole basis for treatment. Nasal washings and aspirates are unacceptable for Xpert Xpress SARS-CoV-2/FLU/RSV testing.  Fact Sheet for Patients: bloggercourse.com  Fact Sheet for Healthcare Providers: seriousbroker.it  This test is not yet approved or cleared by the [place]  FDA and has been authorized for detection and/or diagnosis of SARS-CoV-2 by FDA under an Emergency Use Authorization (EUA). This EUA will remain in effect (meaning this test can be used) for the duration of the COVID-19 declaration under Section 564(b)(1) of the Act, 21 U.S.C. section 360bbb-3(b)(1), unless the authorization is terminated or revoked.     Resp Syncytial Virus by PCR NEGATIVE NEGATIVE Final    Comment: (NOTE) Fact Sheet for Patients: bloggercourse.com  Fact Sheet for Healthcare Providers: seriousbroker.it  This test is not yet approved or cleared by the [place]  FDA and has been authorized for detection and/or diagnosis of SARS-CoV-2 by FDA under an Emergency Use Authorization (EUA). This EUA will remain in effect (meaning this test can be used) for  the duration of the COVID-19 declaration under Section 564(b)(1) of the Act, 21 U.S.C. section 360bbb-3(b)(1), unless the authorization is terminated or revoked.  Performed at [facility], [address] [place], [place] [address]   Culture, blood (Routine X 2) w Reflex to ID Panel     Status: None (Preliminary result)   Collection Time: 10/21/23 10:50 AM   Specimen: BLOOD  Result Value Ref Range Status   Specimen Description BLOOD BLOOD RIGHT WRIST  Final   Special Requests   Final    BOTTLES DRAWN AEROBIC AND ANAEROBIC Blood Culture results may not be optimal due to an inadequate volume of blood received in culture bottles   Culture   Final    NO GROWTH 3 DAYS Performed at [facility], [address] [place], [place] [address]    Report Status PENDING  Incomplete  Culture, blood (Routine X 2) w Reflex to ID Panel     Status: None (Preliminary result)   Collection Time: 10/21/23 11:51 AM   Specimen: BLOOD  Result Value Ref Range Status   Specimen Description BLOOD LEFT ANTECUBITAL  Final   Special Requests   Final    BOTTLES DRAWN AEROBIC AND ANAEROBIC Blood Culture results may not be optimal due to an inadequate volume of blood received in culture bottles   Culture   Final    NO GROWTH 3 DAYS Performed at [facility], [address] [place], [place] [address]    Report Status PENDING  Incomplete  Expectorated Sputum Assessment w Gram Stain, Rflx to Resp Cult     Status: None   Collection Time: 10/21/23  3:07 PM   Specimen: Sputum  Result Value Ref Range Status   Specimen Description SPUTUM  Final   Special Requests NONE  Final   Sputum evaluation   Final    THIS SPECIMEN IS ACCEPTABLE FOR SPUTUM CULTURE Performed at [facility], [address], [place], [place] [address]    Report Status 10/21/2023 FINAL  Final  Culture, Respiratory w  Gram Stain     Status: None (Preliminary result)   Collection Time: 10/21/23  3:07 PM    Specimen: SPU  Result Value Ref Range Status   Specimen Description   Final    SPUTUM Performed at [facility], [address] [place], [place] [address]    Special Requests   Final    NONE Reflexed from [id number] Performed at [facility], [address] [place], [place] [address]    Gram Stain   Final    RARE SQUAMOUS EPITHELIAL CELLS PRESENT RARE YEAST RARE GRAM VARIABLE ROD FEW WBC PRESENT, PREDOMINANTLY MONONUCLEAR    Culture   Final    CULTURE REINCUBATED FOR BETTER GROWTH Performed at [facility], [address], [place], [place] [address]    Report Status PENDING  Incomplete  Respiratory (~20 pathogens) panel by PCR     Status: None   Collection Time: 10/22/23  8:36 AM   Specimen: Nasopharyngeal Swab; Respiratory  Result Value Ref Range Status   Adenovirus NOT DETECTED NOT DETECTED Final   Coronavirus 229E NOT DETECTED NOT DETECTED Final    Comment: (NOTE) The Coronavirus on the Respiratory Panel, DOES NOT test for the novel  Coronavirus (2019 nCoV)    Coronavirus HKU1 NOT DETECTED NOT DETECTED Final   Coronavirus NL63 NOT DETECTED NOT DETECTED Final   Coronavirus OC43 NOT DETECTED NOT DETECTED Final   Metapneumovirus NOT DETECTED NOT DETECTED Final   Rhinovirus / Enterovirus NOT DETECTED NOT DETECTED Final   Influenza A NOT DETECTED NOT DETECTED Final   Influenza B NOT DETECTED NOT DETECTED Final   Parainfluenza Virus 1 NOT DETECTED NOT DETECTED Final   Parainfluenza Virus 2 NOT DETECTED NOT DETECTED Final   Parainfluenza Virus 3 NOT DETECTED NOT DETECTED Final   Parainfluenza Virus 4 NOT DETECTED NOT DETECTED Final   Respiratory Syncytial Virus NOT DETECTED NOT DETECTED Final   Bordetella pertussis NOT DETECTED NOT DETECTED Final   Bordetella Parapertussis NOT DETECTED NOT DETECTED Final   Chlamydophila pneumoniae NOT DETECTED NOT DETECTED Final   Mycoplasma pneumoniae NOT DETECTED NOT DETECTED Final    Comment: Performed at [facility], [address] [place], [place] [address]     Labs: CBC: Recent Labs  Lab 10/21/23 [id number] 10/22/23 0523 10/23/23 0248 10/24/23 0550  WBC 9.0 16.1* 14.6* 14.1*  HGB 16.7 16.0 15.1 15.4  HCT 49.5 46.1 45.5 45.0  MCV 85.2 83.4 87.0 85.9  PLT 222 217 210 198   Basic Metabolic Panel: Recent Labs  Lab 10/21/23 0735 10/21/23 1647 10/22/23 0523 10/23/23 0248 10/24/23 0550  NA 135  --  136 136 135  K 3.4*  --  3.6 4.2 3.6  CL 100  --  93* 94* 96*  CO2 24  --  27 28 26   GLUCOSE 265* 385* 288* 294* 189*  BUN 13  --  23* 39* 45*  CREATININE 1.27*  --  1.47* 2.18* 1.99*  CALCIUM  9.1  --  8.9 8.2* 8.0*  MG  --   --  1.8 2.1 2.2  PHOS  --   --  3.6 5.0* 4.8*   Liver Function Tests: Recent Labs  Lab 10/22/23 0523  AST 21  ALT 23  ALKPHOS 66  BILITOT 1.0  PROT 7.3  ALBUMIN 3.8   No results for input(s): LIPASE, AMYLASE in the last 168 hours. No results for input(s): AMMONIA in the last 168 hours. Cardiac Enzymes: No results for input(s): CKTOTAL, CKMB,  CKMBINDEX, TROPONINI in the last 168 hours. BNP (last 3 results) Recent Labs    08/19/23 1511 10/17/23 0944 10/21/23 0735  BNP 143.9* 52.4 331.6*   CBG: Recent Labs  Lab 10/23/23 0813 10/23/23 1141 10/23/23 1629 10/23/23 2114 10/24/23 0910  GLUCAP 289* 208* 176* 174* 263*    Time spent: 35 minutes  Signed:  Elvan Sor  [facility] 10/24/2023 11:58 AM

## 2023-10-25 ENCOUNTER — Other Ambulatory Visit: Payer: Self-pay

## 2023-10-26 LAB — CULTURE, BLOOD (ROUTINE X 2)
Culture: NO GROWTH
Culture: NO GROWTH

## 2023-10-28 ENCOUNTER — Encounter: Payer: Self-pay | Admitting: Physician Assistant

## 2023-10-28 ENCOUNTER — Ambulatory Visit: Payer: Medicaid Other | Attending: Physician Assistant | Admitting: Physician Assistant

## 2023-10-28 ENCOUNTER — Other Ambulatory Visit: Payer: Self-pay

## 2023-10-28 VITALS — BP 178/99 | HR 77 | Ht 66.0 in | Wt 217.8 lb

## 2023-10-28 DIAGNOSIS — R0609 Other forms of dyspnea: Secondary | ICD-10-CM

## 2023-10-28 DIAGNOSIS — N179 Acute kidney failure, unspecified: Secondary | ICD-10-CM

## 2023-10-28 DIAGNOSIS — I2089 Other forms of angina pectoris: Secondary | ICD-10-CM

## 2023-10-28 DIAGNOSIS — N189 Chronic kidney disease, unspecified: Secondary | ICD-10-CM

## 2023-10-28 DIAGNOSIS — I25118 Atherosclerotic heart disease of native coronary artery with other forms of angina pectoris: Secondary | ICD-10-CM | POA: Diagnosis not present

## 2023-10-28 DIAGNOSIS — E785 Hyperlipidemia, unspecified: Secondary | ICD-10-CM | POA: Diagnosis not present

## 2023-10-28 DIAGNOSIS — R0602 Shortness of breath: Secondary | ICD-10-CM | POA: Diagnosis not present

## 2023-10-28 DIAGNOSIS — I5032 Chronic diastolic (congestive) heart failure: Secondary | ICD-10-CM | POA: Diagnosis not present

## 2023-10-28 DIAGNOSIS — Z6835 Body mass index (BMI) 35.0-35.9, adult: Secondary | ICD-10-CM | POA: Diagnosis not present

## 2023-10-28 DIAGNOSIS — G4733 Obstructive sleep apnea (adult) (pediatric): Secondary | ICD-10-CM | POA: Diagnosis not present

## 2023-10-28 DIAGNOSIS — R079 Chest pain, unspecified: Secondary | ICD-10-CM | POA: Diagnosis not present

## 2023-10-28 DIAGNOSIS — E66812 Obesity, class 2: Secondary | ICD-10-CM

## 2023-10-28 DIAGNOSIS — R7989 Other specified abnormal findings of blood chemistry: Secondary | ICD-10-CM | POA: Diagnosis not present

## 2023-10-28 DIAGNOSIS — I200 Unstable angina: Secondary | ICD-10-CM | POA: Diagnosis not present

## 2023-10-28 DIAGNOSIS — I10 Essential (primary) hypertension: Secondary | ICD-10-CM | POA: Diagnosis not present

## 2023-10-28 IMAGING — CT CT ABD-PELV W/ CM
2 of 5 series · 15 of 46 positions shown, 17 images · IV contrast (agent unspecified)
Comparison: None.

CLINICAL DATA: 55-year-old male presenting with left lower quadrant
pain.

EXAM:
CT ABDOMEN AND PELVIS WITH CONTRAST
TECHNIQUE: Multidetector CT imaging of the abdomen and pelvis was performed
using the standard protocol following bolus administration of
intravenous contrast.

[Series 2: abd pelvis 5.00 · axial · 0.98mm/px · z∈[-1535,-1105]mm · 12 of 102 slices shown, 14 images]
[im 8/102  soft-tissue]
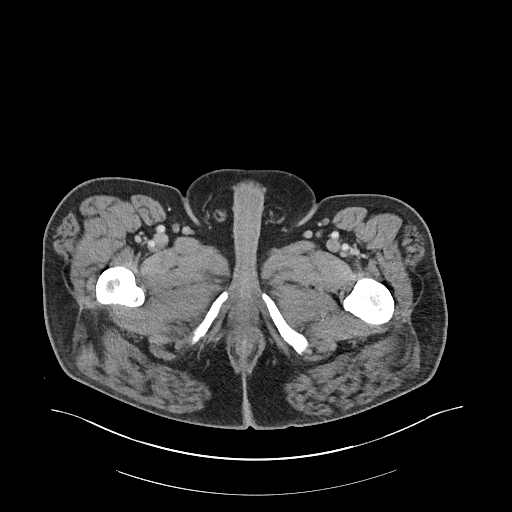
[im 8/102  bone]
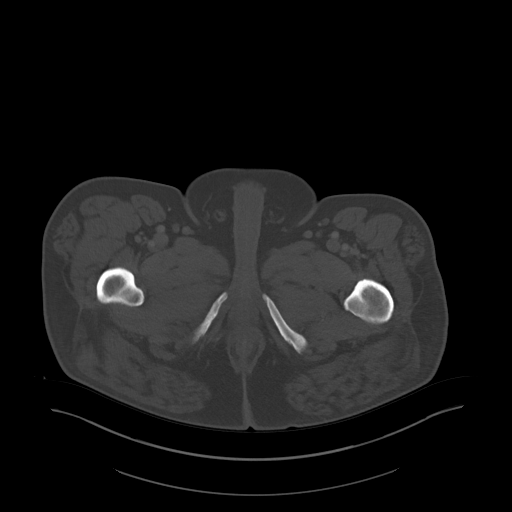
[im 16/102  soft-tissue]
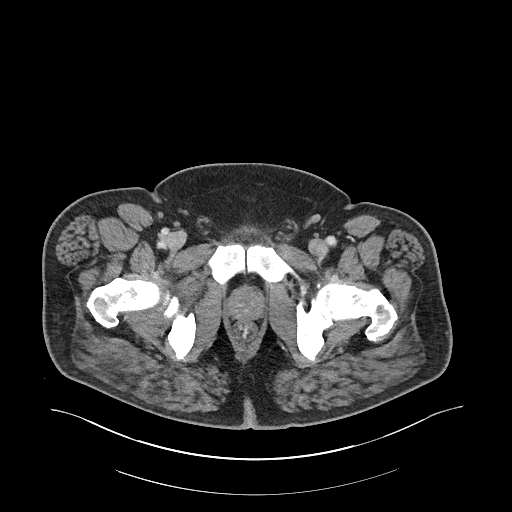
[im 24/102  soft-tissue]
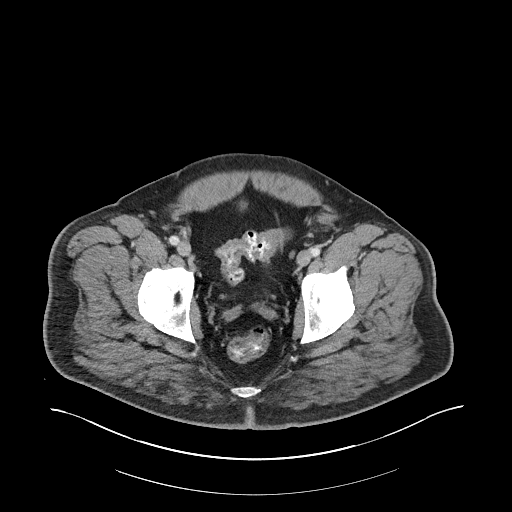
[im 32/102  soft-tissue]
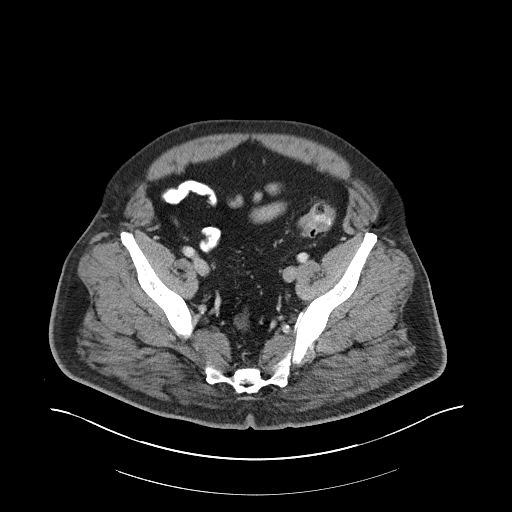
[im 39/102  soft-tissue]
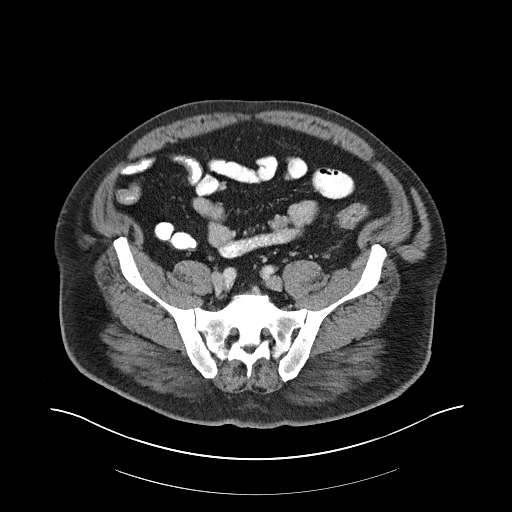
[im 47/102  soft-tissue]
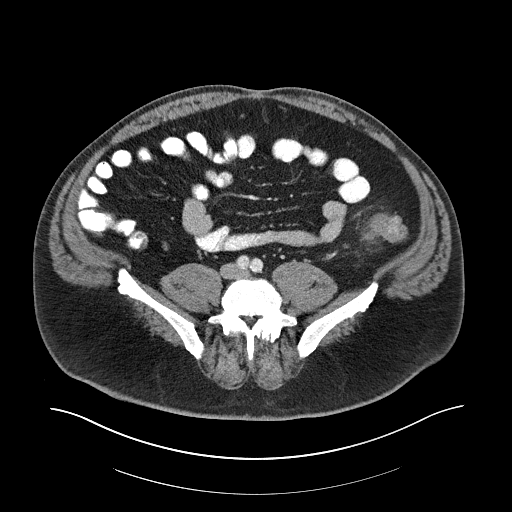
[im 55/102  soft-tissue]
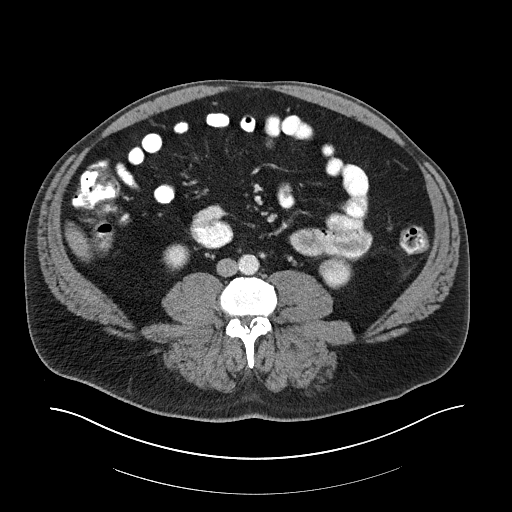
[im 63/102  soft-tissue]
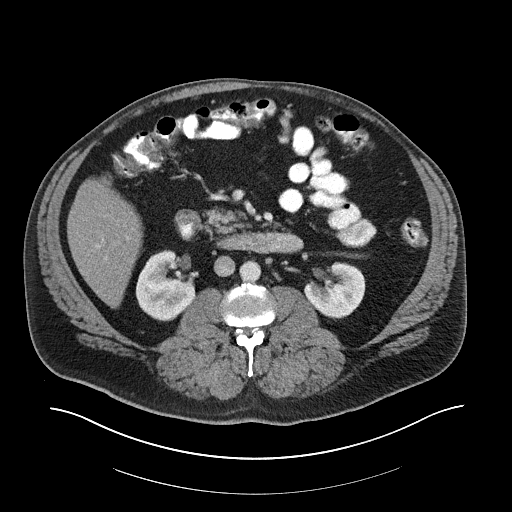
[im 70/102  soft-tissue]
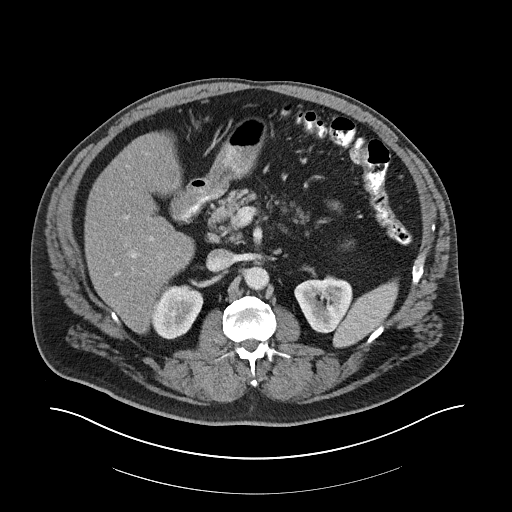
[im 70/102  bone]
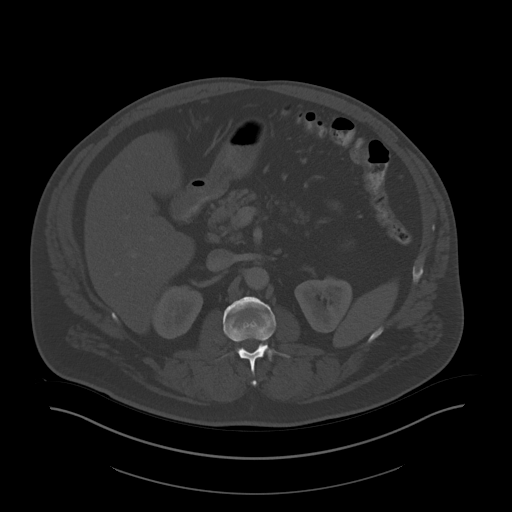
[im 78/102  soft-tissue]
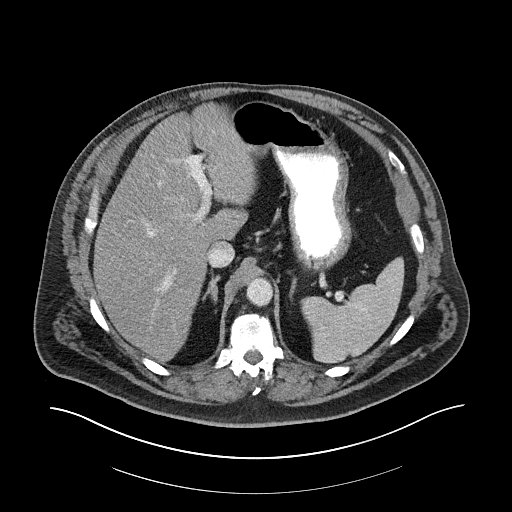
[im 86/102  soft-tissue]
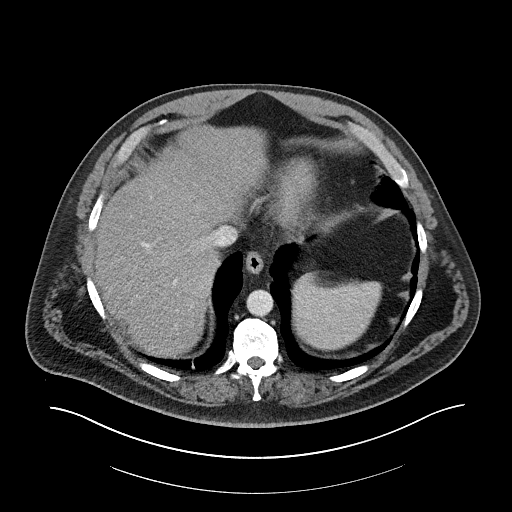
[im 94/102  soft-tissue]
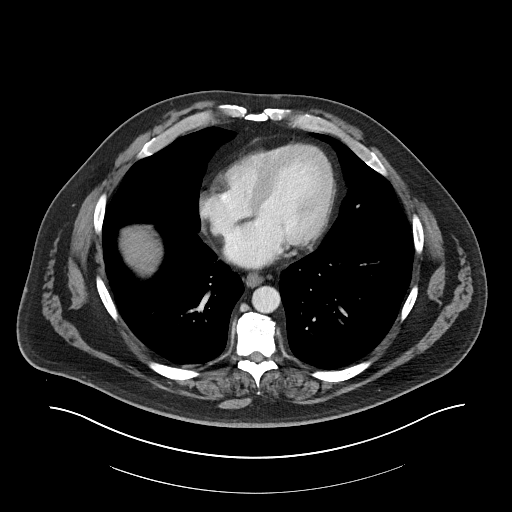

[Series 4: coronals abd pelvis 2.00 cor · coronal · 0.93mm/px · 3 of 174 slices shown]
[im 58/174  soft-tissue]
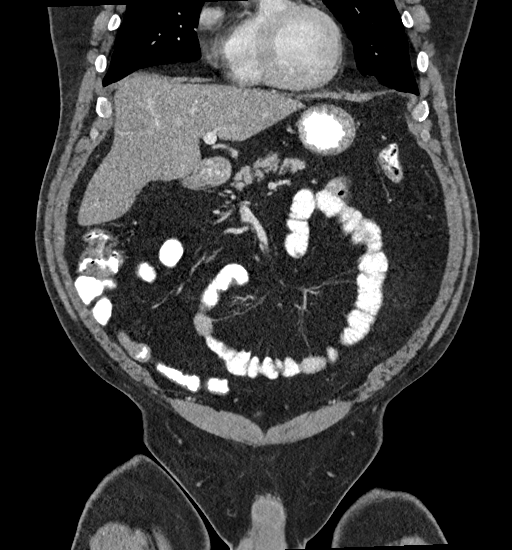
[im 77/174  soft-tissue]
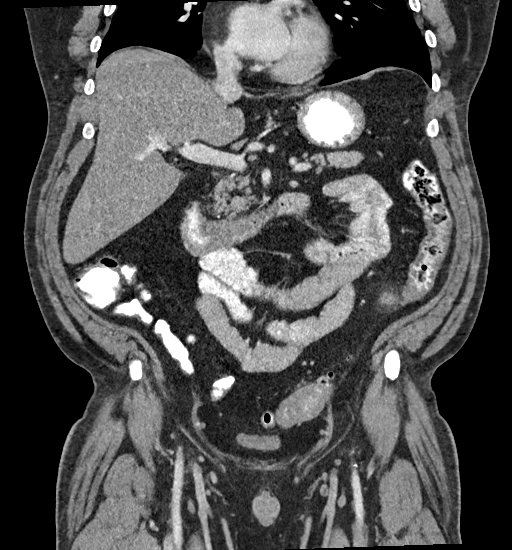
[im 97/174  soft-tissue]
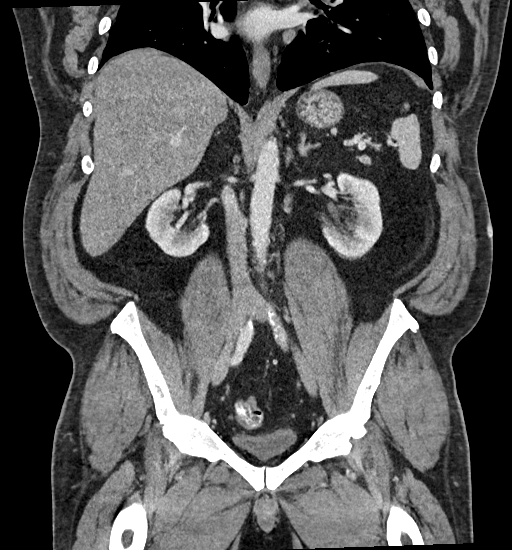

[15 of 46 positions shown; findings below may reference images not displayed]

RADIATION DOSE REDUCTION: This exam was performed according to the
departmental dose-optimization program which includes automated
exposure control, adjustment of the mA and/or kV according to
patient size and/or use of iterative reconstruction technique.

CONTRAST:  100mL OMNIPAQUE IOHEXOL 300 MG/ML  SOLN
FINDINGS: Lower chest: No acute abnormality.

Hepatobiliary: No focal liver abnormality is seen. Status post
cholecystectomy. No biliary dilatation.

Pancreas: Unremarkable. No pancreatic ductal dilatation or
surrounding inflammatory changes.

Spleen: Normal in size without focal abnormality.

Adrenals/Urinary Tract: Adrenal glands are unremarkable. Kidneys are
normal, without renal calculi, focal lesion, or hydronephrosis.
Bladder is unremarkable.

Stomach/Bowel: Stomach is within normal limits. Numerous colonic
diverticula. There is mild pericolonic fat stranding in the left
lower quadrant along the distal descending colon. There is no
evidence of pericolonic abscess or free air to suggest perforation.

Vascular/Lymphatic: No significant vascular findings are present. No
enlarged abdominal or pelvic lymph nodes.

Reproductive: Prostate is unremarkable.

Other: No abdominal wall hernia or abnormality. No abdominopelvic
ascites.

Musculoskeletal: No acute or significant osseous findings.
IMPRESSION: Mild acute uncomplicated diverticulitis in the left lower quadrant.

## 2023-10-28 NOTE — Patient Instructions (Signed)
 Medication Instructions:  Your Physician recommend you continue on your current medication as directed.    *If you need a refill on your cardiac medications before your next appointment, please call your pharmacy*   Lab Work: Your provider would like for you to have following labs drawn today BMeT, CBC, and BNP.   If you have labs (blood work) drawn today and your tests are completely normal, you will receive your results only by: MyChart Message (if you have MyChart) OR A paper copy in the mail If you have any lab test that is abnormal or we need to change your treatment, we will call you to review the results.   Testing/Procedures:   [facility] [place] [place] [address] [place] [address] Dept: [phone] Loc: [phone]  KYLIL SWOPES  10/28/2023  You are scheduled for a Cardiac Catheterization on Wednesday, January 15 with Dr. Lonni End.  1. Please arrive at the [facility] Entrance of [facility], [address], [place] [address] at 11:30 AM (This is 1 hour(s) prior to your procedure time).  Proceed to the Check-In Desk directly inside the entrance.  Procedure Parking: Use the entrance off of the [address] side of the hospital. Turn right upon entering and follow the driveway to parking that is directly in front of the [facility]. There is no valet parking available at this entrance, however there is an awning directly in front of the [facility] for drop off/ pick up for patients.  Special note: Every effort is made to have your procedure done on time. Please understand that emergencies sometimes delay scheduled procedures.  2. Diet: Do not eat solid foods after midnight.  The patient may have clear liquids until 5am upon the day of the procedure.  3. Labs: You will need to have blood drawn today.  4. Medication  instructions in preparation for your procedure:   Contrast Allergy: No  Take only 10 units of insulin  the night before your procedure. Do not take any insulin  on the day of the procedure.  Do not take Diabetes Med Glucophage  (Metformin ) on the day of the procedure and HOLD 48 HOURS AFTER THE PROCEDURE.  On the morning of your procedure, take your Aspirin  81 mg and any morning medicines NOT listed above.  You may use sips of water.  5. Plan to go home the same day, you will only stay overnight if medically necessary. 6. Bring a current list of your medications and current insurance cards. 7. You MUST have a responsible person to drive you home. 8. Someone MUST be with you the first 24 hours after you arrive home or your discharge will be delayed. 9. Please wear clothes that are easy to get on and off and wear slip-on shoes.  Thank you for allowing us  to care for you!   -- [facility]   Your physician has requested that you have an echocardiogram. Echocardiography is a painless test that uses sound waves to create images of your heart. It provides your doctor with information about the size and shape of your heart and how well your heart's chambers and valves are working.   You may receive an ultrasound enhancing agent through an IV if needed to better visualize your heart during the echo. This procedure takes approximately one hour.  There are no restrictions for this  procedure.  This will take place at [address] [facility] [address], [place] [address]  Please note: We ask at that you not bring children with you during ultrasound (echo/ vascular) testing. Due to room size and safety concerns, children are not allowed in the ultrasound rooms during exams. Our front office staff cannot provide observation of children in our lobby area while testing is being conducted. An adult accompanying a patient to their appointment will only be allowed  in the ultrasound room at the discretion of the ultrasound technician under special circumstances. We apologize for any inconvenience.    Follow-Up: At [facility], you and your health needs are our priority.  As part of our continuing mission to provide you with exceptional heart care, we have created designated Provider Care Teams.  These Care Teams include your primary Cardiologist (physician) and Advanced Practice Providers (APPs -  Physician Assistants and Nurse Practitioners) who all work together to provide you with the care you need, when you need it.  We recommend signing up for the patient portal called MyChart.  Sign up information is provided on this After Visit Summary.  MyChart is used to connect with patients for Virtual Visits (Telemedicine).  Patients are able to view lab/test results, encounter notes, upcoming appointments, etc.  Non-urgent messages can be sent to your provider as well.   To learn more about what you can do with MyChart, go to [email or web address].    Your next appointment:   2 week(s)  Provider:   You may see Redell Cave, MD or one of the following Advanced Practice Providers on your designated Care Team:   Bernardino Bring, PA-C

## 2023-10-28 NOTE — H&P (View-Only) (Signed)
 Cardiology Office Note    Date:  10/28/2023   ID:  Cory Rivas, DOB 02-27-1966, MRN [id number]  PCP:  Leavy Mole, PA-C  Cardiologist:  Redell Cave, MD  Electrophysiologist:  None   Chief Complaint: Hospital follow up  History of Present Illness:   Cory Rivas is a 58 y.o. male with history of nonobstructive CAD, HFpEF, CKD stage IIIa, IDDM, HTN, HLD, cervical myelopathy, idiopathic chronic gout, tobacco use for 35+years, asthma, obesity with OSA and tracheobronchomalacia who presents for hospital follow-up following recent admission to [facility] from 1/6-10/24/2023 for acute hypoxic respiratory failure in the setting of COPD exacerbation and acute on chronic HFpEF complicated by AKI.  In the setting of preoperative cardiac risk stratification for murmur he underwent Lexiscan  MPI in 09/2020 which showed no significant ischemia or coronary artery calcification on CT imaging.  Echo in 10/2020 showed an EF of 60 to 65%, no regional wall motion abnormalities, grade 1 diastolic dysfunction, and no significant valvular abnormalities.  Repeat echo in 08/2022 showed an EF of 60 to 65%, no regional wall motion normalities, mild LVH, grade 2 diastolic dysfunction, normal RV systolic function and ventricular cavity size mild mitral regurgitation, and aortic valve sclerosis without evidence of stenosis.   He was admitted to the hospital in 05/2023 with chest pain and shortness of breath with diaphoresis after eating dinner.  He was markedly hypertensive upon arrival with a blood pressure of 210/103.  High-sensitivity troponin 26 trending to 24.  BNP 199.  Chest x-ray without acute cardiopulmonary process.  Echo on 05/28/2023 showed an EF of 50 to 55%, no regional wall motion abnormalities, moderate LVH, grade 3 diastolic dysfunction, elevated left atrial pressure, normal RV systolic function and ventricular cavity size, mildly dilated left atrium, mild mitral regurgitation, aortic valve sclerosis without  evidence of stenosis, and an estimated right atrial pressure of 3 mmHg.  R/LHC on 05/29/2023 showed moderate, nonobstructive CAD with sequential 50 to 60% ostial and proximal LAD stenoses that were not hemodynamically significant (RFR 0.93).  There was also 40% ostial stenosis of a large OM1 branch.  The nondominant RCA was without significant disease.  Upper normal to mildly elevated left and right heart filling pressures.  Mild pulmonary hypertension.  Borderline low cardiac output/index.  Mild aortic stenosis.  Gentle diuresis and medical therapy/risk factor modification were recommended.     He was seen in hospital follow-up on 06/21/2023 and was without symptoms of angina or cardiac decompensation.  Weight was down 7 pounds by our scale.  He was adherent and tolerating cardiac medications.  He continued to smoke approximately 1/2 pack of cigarettes per day, was only taking 1 or 2 drags then letting the cigarette burn out.  Carvedilol  was titrated to 12.5 mg twice daily with continuation of Jardiance , lisinopril , spironolactone , and torsemide .     He was admitted to the hospital in early 08/2023 with hypertensive urgency with blood pressure 195/95 with associated chest pressure and dyspnea with cough productive of yellow sputum.  He reported eating some chicken wings leading up to this hospitalization.  Reported adherence to medications.  High-sensitivity troponin 23 with a delta troponin of 22.  D-dimer negative.  BNP 143.  Chest x-ray without acute cardiopulmonary process.  EKG showed sinus rhythm with occasional PACs with known inferolateral T wave inversion.  He reported good urine output with IV Lasix .  With improvement in blood pressure, and chest pressure also improved.  Mildly elevated troponin felt to be reflective of supply/demand ischemia  in the context of volume overload and hypertensive urgency.  Recent LHC in 05/2023 showed nonobstructive CAD as outlined above.  He was seen in the ED in 08/2023  with chest pain similar to his prior evaluations and admissions.  High-sensitivity troponin negative x 1.  D-dimer negative.  Chest x-ray notable for patchy left lower opacity representative of possible atelectasis or pneumonia.  He was discharged with antibiotic therapy.  He was recently admitted to [facility] from 1/6-10/24/2023 with acute hypoxic respiratory failure secondary to COPD exacerbation and acute on chronic HFpEF with admission complicated by acute on CKD stage IIIa.  There was concern for dietary indiscretion, patient denies this.  High-sensitivity troponin 21 with a delta troponin of 20 subsequently trended to 19.  BNP 331.  D-dimer negative.  He was treated with supplemental oxygen, IV Lasix  in the ER followed by increase in home torsemide  to 40 mg twice daily, Solu-Medrol , DuoNebs, and Rocephin  and azithromycin .  At time of discharge, his lisinopril , spironolactone , and torsemide  were held due to AKI.  At time of discharge, patient continued to note some dyspnea and chest discomfort along with dizziness.  He comes in today continuing to note chest discomfort and dyspnea that is unchanged compared to his hospital presentation earlier this month.  He reports adherence to his medications leading up to his admission and indicates he has not smoked a cigarette since sometime in November.  No significant lower extremity swelling.  He sleeps in a recliner at baseline.  He does note some early satiety.  His weight is down 6 pounds today by our scale when compared to his last visit in our office in 08/2023.  He denies dietary indiscretion.  No syncope.   Labs independently reviewed: 10/2023 - magnesium  2.2, Hgb 15.4, PLT 198, potassium 3.6, BUN 45, serum creatinine 1.99, albumin 3.8, AST/ALT normal 08/2023 - direct LDL 147 07/2023 - A1c 9.8, TC 241, TG 404, HDL 38 04/2023 - TSH normal  Past Medical History:  Diagnosis Date   Bulging of cervical intervertebral disc    CHF (congestive heart failure)  (HCC)    Diabetes mellitus without complication (HCC)    Gout    High cholesterol    Hypertension    Stress due to family tension 04/15/2019    Past Surgical History:  Procedure Laterality Date   BACK SURGERY     CARDIAC CATHETERIZATION     CHOLECYSTECTOMY     COLONOSCOPY WITH PROPOFOL  N/A 03/10/2020   Procedure: COLONOSCOPY WITH PROPOFOL ;  Surgeon: Jinny Carmine, MD;  Location: [facility] ENDOSCOPY;  Service: Endoscopy;  Laterality: N/A;   COLONOSCOPY WITH PROPOFOL  N/A 09/25/2023   Procedure: COLONOSCOPY WITH PROPOFOL ;  Surgeon: Jinny Carmine, MD;  Location: [facility] ENDOSCOPY;  Service: Endoscopy;  Laterality: N/A;   CORONARY PRESSURE/FFR STUDY N/A 05/29/2023   Procedure: CORONARY PRESSURE/FFR STUDY;  Surgeon: Mady Bruckner, MD;  Location: [facility] INVASIVE CV LAB;  Service: Cardiovascular;  Laterality: N/A;   POLYPECTOMY  09/25/2023   Procedure: POLYPECTOMY;  Surgeon: Jinny Carmine, MD;  Location: [facility] ENDOSCOPY;  Service: Endoscopy;;   RIGHT/LEFT HEART CATH AND CORONARY ANGIOGRAPHY N/A 05/29/2023   Procedure: RIGHT/LEFT HEART CATH AND CORONARY ANGIOGRAPHY;  Surgeon: Mady Bruckner, MD;  Location: [facility] INVASIVE CV LAB;  Service: Cardiovascular;  Laterality: N/A;    Current Medications: Current Meds  Medication Sig   allopurinol  (ZYLOPRIM ) 300 MG tablet TAKE ONE TABLET BY MOUTH DAILY WITH 100 MG TABLET. FOR A TOTAL OF 400 MG DAILY FOR GOUT   amLODipine  (NORVASC ) 5 MG  tablet Take 1 tablet (5 mg total) by mouth daily.   aspirin  EC 81 MG tablet Take 1 tablet (81 mg total) by mouth daily. Swallow whole.   budesonide -formoterol  (SYMBICORT ) 160-4.5 MCG/ACT inhaler Inhale 2 puffs into the lungs 2 (two) times daily.   carvedilol  (COREG ) 25 MG tablet Take 1 tablet (25 mg total) by mouth 2 (two) times daily.   DULoxetine  (CYMBALTA ) 30 MG capsule Take 60 mg (2 capsules) poqam and 30 mg (1 capsule) poqpm daily   empagliflozin  (JARDIANCE ) 10 MG TABS tablet Take 1 tablet (10 mg total) by mouth daily.    ezetimibe  (ZETIA ) 10 MG tablet TAKE ONE TABLET BY MOUTH DAILY   fluticasone  (FLONASE ) 50 MCG/ACT nasal spray Place 1 spray into both nostrils daily.   insulin  glargine (LANTUS  SOLOSTAR) 100 UNIT/ML Solostar Pen Inject 20 Units into the skin at bedtime.   insulin  lispro (HUMALOG ) 100 UNIT/ML KwikPen Inject 8 Units into the skin 3 (three) times daily. Sliding scale up to 45 units per injection   ipratropium-albuterol  (DUONEB) 0.5-2.5 (3) MG/3ML SOLN Take 3 mLs by nebulization every 6 (six) hours as needed (SOB wheeze cough).   levocetirizine (XYZAL ) 5 MG tablet TAKE ONE TABLET BY MOUTH EVERY EVENING   metformin  (FORTAMET ) 500 MG (OSM) 24 hr tablet Take 2,000 mg by mouth at bedtime.   metFORMIN  (GLUCOPHAGE -XR) 500 MG 24 hr tablet Take 1,000 mg by mouth 2 (two) times daily.   pantoprazole  (PROTONIX ) 40 MG tablet Take 1 tablet (40 mg total) by mouth daily.   pregabalin  (LYRICA ) 50 MG capsule TAKE ONE CAPSULE (50 MG TOTAL) BY MOUTH TWO TIMES DAILY.   rosuvastatin  (CRESTOR ) 40 MG tablet TAKE ONE TABLET BY MOUTH DAILY   Tiotropium Bromide  Monohydrate (SPIRIVA  RESPIMAT) 2.5 MCG/ACT AERS Inhale 2 puffs into the lungs daily.   TRULICITY  3 MG/0.5ML SOPN Inject 3 mg into the skin once a week. SATURDAYS   VENTOLIN  HFA 108 (90 Base) MCG/ACT inhaler INHALE TWO PUFFS INTO THE LUNGS EVERY SIX HOURS AS NEEDED FOR WHEEZING OR SHORTNESS OF BREATH    Allergies:   Oxycodone , Entresto  [sacubitril -valsartan ], Onion, and Nitroglycerin    Social History   Socioeconomic History   Marital status: Divorced    Spouse name: Not on file   Number of children: 0   Years of education: 9   Highest education level: 9th grade  Occupational History   Occupation: disability  Tobacco Use   Smoking status: Some Days    Current packs/day: 0.00    Average packs/day: 1 pack/day for 37.0 years (37.0 ttl pk-yrs)    Types: Cigarettes    Start date: 10/19/1989    Last attempt to quit: 08/20/2022    Years since quitting: 1.1    Smokeless tobacco: Never   Tobacco comments:    30+ years hx smoking 2 ppd, stopped smoking briefly in April 2022 for a surgery and then restarted smoking about 1/2 ppd last 4-5 months         5-6cigs daily- 04/10/2023  Vaping Use   Vaping status: Never Used  Substance and Sexual Activity   Alcohol use: No   Drug use: No   Sexual activity: Not Currently    Partners: Female  Other Topics Concern   Not on file  Social History Narrative   Lives with sister [name], brother in law [name]), and his mother - Edan Juday   Social Drivers of Health   Financial Resource Strain: Medium Risk (12/06/2022)   Overall Financial Resource Strain (CARDIA)  Difficulty of Paying Living Expenses: Somewhat hard  Food Insecurity: No Food Insecurity (10/23/2023)   Hunger Vital Sign    Worried About Running Out of Food in the Last Year: Never true    Ran Out of Food in the Last Year: Never true  Transportation Needs: Patient Declined (10/23/2023)   PRAPARE - Transportation    Lack of Transportation (Medical): Patient declined    Lack of Transportation (Non-Medical): Patient declined  Physical Activity: Insufficiently Active (12/06/2022)   Exercise Vital Sign    Days of Exercise per Week: 3 days    Minutes of Exercise per Session: 10 min  Stress: No Stress Concern Present (12/06/2022)   [employer] of Occupational Health - Occupational Stress Questionnaire    Feeling of Stress : Only a little  Social Connections: Socially Isolated (12/06/2022)   Social Connection and Isolation Panel [NHANES]    Frequency of Communication with Friends and Family: More than three times a week    Frequency of Social Gatherings with Friends and Family: Twice a week    Attends Religious Services: Never    Database Administrator or Organizations: No    Attends Engineer, Structural: Never    Marital Status: Divorced     Family History:  The patient's family history includes AAA (abdominal aortic aneurysm) in his  maternal grandmother; Diabetes in his maternal grandfather and mother; Heart attack in his maternal grandmother and mother; Heart disease in his mother; Hyperlipidemia in his mother; Hypertension in his father and mother; Lung cancer in his father; Stroke in his father.  ROS:   12-point review of systems is negative unless otherwise noted in the HPI.   EKGs/Labs/Other Studies Reviewed:    Studies reviewed were summarized above. The additional studies were reviewed today:  [facility] 05/29/2023: Conclusions: Moderate, nonobstructive coronary artery disease with sequential 50-60% ostial and proximal LAD stenoses that are not hemodynamically significant (RFR = 0.93).  There is also a 40% ostial stenosis of a large first OM branch.  Nondominant RCA is without significant disease. Upper normal to mildly elevated left and right heart filling pressures. Mild pulmonary hypertension. Borderline low Fick cardiac output/index. Mild aortic stenosis.   Recommendations: Resume gentle diuresis tomorrow if renal function is stable. Medical therapy and risk factor modification to prevent progression of coronary artery disease. __________   2D echo 05/28/2023: 1. Left ventricular ejection fraction, by estimation, is 50 to 55%. The  left ventricle has low normal function. The left ventricle has no regional  wall motion abnormalities. There is moderate left ventricular hypertrophy.  Left ventricular diastolic  parameters are consistent with Grade III diastolic dysfunction  (restrictive). Elevated left atrial pressure. The average left ventricular  global longitudinal strain is -10.0 %. The global longitudinal strain is  abnormal.   2. Right ventricular systolic function is normal. The right ventricular  size is normal. Tricuspid regurgitation signal is inadequate for assessing  PA pressure.   3. Left atrial size was mildly dilated.   4. The mitral valve was not well visualized. Mild mitral valve   regurgitation.   5. The aortic valve has an indeterminant number of cusps. There is  moderate calcification of the aortic valve. There is moderate thickening  of the aortic valve. Aortic valve regurgitation is not visualized. Aortic  valve sclerosis/calcification is  present, without any evidence of aortic stenosis.   6. The inferior vena cava is normal in size with greater than 50%  respiratory variability, suggesting right atrial pressure of  3 mmHg.  __________   2D echo 08/23/2022: 1. Left ventricular ejection fraction, by estimation, is 60 to 65%. The  left ventricle has normal function. The left ventricle has no regional  wall motion abnormalities. There is mild left ventricular hypertrophy.  Left ventricular diastolic parameters  are consistent with Grade II diastolic dysfunction (pseudonormalization).   2. Right ventricular systolic function is normal. The right ventricular  size is normal.   3. The mitral valve is normal in structure. Mild mitral valve  regurgitation. No evidence of mitral stenosis.   4. The aortic valve is calcified. Aortic valve regurgitation is not  visualized. Aortic valve sclerosis/calcification is present, without any  evidence of aortic stenosis.   5. The inferior vena cava is normal in size with greater than 50%  respiratory variability, suggesting right atrial pressure of 3 mmHg.  __________   2D echo 11/04/2020: 1. Left ventricular ejection fraction, by estimation, is 60 to 65%. The  left ventricle has normal function. The left ventricle has no regional  wall motion abnormalities. Left ventricular diastolic parameters are  consistent with Grade I diastolic  dysfunction (impaired relaxation).There is mild left ventricular  hypertrophy of the basal segment. LVOT gradient of 10 mm Hg at rest, 22 mm  Hg gradient with valsalva   2. Right ventricular systolic function is normal. The right ventricular  size is normal. There is normal pulmonary artery  systolic pressure. The  estimated right ventricular systolic pressure is 31.6 mmHg.   3. Left atrial size was mildly dilated.  ____________   Lexiscan  MPI 10/10/2020: Low risk, probably normal pharmacologic myocardial perfusion stress tst. There is a small in size, moderate in severity, fixed defect at the apex most likely representing artifact (apical thinning and attenuation) and less likely scar. There is no evidence of significant ischemia. The left ventricular ejection fraction is normal (59%). There is no significant coronary artery calcification on the attenuation correction CT.   EKG:  EKG is ordered today.  The EKG ordered today demonstrates NSR, 77 bpm, lateral T wave inversion consistent with prior tracing  Recent Labs: 10/21/2023: B Natriuretic Peptide 331.6 10/22/2023: ALT 23 10/24/2023: BUN 45; Creatinine, Ser 1.99; Hemoglobin 15.4; Magnesium  2.2; Platelets 198; Potassium 3.6; Sodium 135  Recent Lipid Panel    Component Value Date/Time   CHOL 241 (H) 08/09/2023 1156   TRIG 404 (H) 08/09/2023 1156   HDL 38 (L) 08/09/2023 1156   CHOLHDL 6.3 (H) 08/09/2023 1156   VLDL 53 (H) 06/08/2022 1534   LDLCALC  08/09/2023 1156     Comment:     . LDL cholesterol not calculated. Triglyceride levels greater than 400 mg/dL invalidate calculated LDL results. . Reference range: <100 . Desirable range <100 mg/dL for primary prevention;   <70 mg/dL for patients with CHD or diabetic patients  with > or = 2 CHD risk factors. SABRA LDL-C is now calculated using the Martin-Hopkins  calculation, which is a validated novel method providing  better accuracy than the Friedewald equation in the  estimation of LDL-C.  Gladis APPLETHWAITE et al. SANDREA. 7986;689(80): 2061-2068  (http://education.[employer].com/faq/FAQ164)    LDLDIRECT 147 (H) 08/19/2023 2222    PHYSICAL EXAM:    VS:  BP (!) 178/99 (BP Location: Left Arm, Patient Position: Sitting, Cuff Size: Normal)   Pulse 77   Ht 5' 6 (1.676 m)    Wt 217 lb 12.8 oz (98.8 kg)   SpO2 96%   BMI 35.15 kg/m   BMI: Body mass index is 35.15  kg/m.  Physical Exam Vitals reviewed.  Constitutional:      Appearance: He is well-developed.  HENT:     Head: Normocephalic and atraumatic.  Eyes:     General:        Right eye: No discharge.        Left eye: No discharge.  Neck:     Comments: JVD difficult to assess secondary to body habitus. Cardiovascular:     Rate and Rhythm: Normal rate and regular rhythm.     Heart sounds: S1 normal and S2 normal. Heart sounds not distant. No midsystolic click and no opening snap. Murmur heard.     Systolic murmur is present with a grade of 1/6 at the upper right sternal border.     No friction rub.  Pulmonary:     Effort: Pulmonary effort is normal. No respiratory distress.     Breath sounds: Normal breath sounds. No decreased breath sounds, wheezing, rhonchi or rales.     Comments: Diminished breath sounds bilaterally. Chest:     Chest wall: No tenderness.  Abdominal:     General: There is no distension.  Musculoskeletal:     Cervical back: Normal range of motion.     Comments: Mild bilateral pretibial edema.  Skin:    General: Skin is warm and dry.     Nails: There is no clubbing.  Neurological:     Mental Status: He is alert and oriented to person, place, and time.  Psychiatric:        Speech: Speech normal.        Behavior: Behavior normal.        Thought Content: Thought content normal.        Judgment: Judgment normal.     Wt Readings from Last 3 Encounters:  10/28/23 217 lb 12.8 oz (98.8 kg)  10/24/23 226 lb 6.6 oz (102.7 kg)  10/17/23 232 lb (105.2 kg)     ASSESSMENT & PLAN:   CAD involving the native coronary arteries with elevated high-sensitivity troponin and exertional dyspnea: He has continued to note symptoms of exertional dyspnea and chest pain leading to multiple ED visits and hospitalizations where he was found to have a mildly elevated, though flat trending  high-sensitivity troponin.  Given recurrent admissions, and with persistent symptoms noted at this time, we will pursue diagnostic R/LHC to evaluate for any change in his coronary anatomy and to help guide pharmacotherapy moving forward.  Continue aspirin  81 mg, amlodipine  5 mg, carvedilol  25 mg twice daily, ezetimibe  10 mg, and rosuvastatin  40 mg.  HFpEF: Volume status is difficult to assess on physical exam secondary to body habitus.  His weight is down 6 pounds today when compared to his last clinic visit.  Spironolactone  and torsemide  have been held since hospital discharge in the setting of AKI.  Check BMP with resumption of pharmacotherapy as tolerated.  Remains on Jardiance  10 mg.  Consider rechallenge of ARNI (previously noted dizziness on Entresto ).  Suspected nonadherence to CPAP is also contributing to symptoms.  CHF education.  HTN: Blood pressure is poorly controlled in the office today.  Suspect some of this is related to his lisinopril  and spironolactone  having been held the past 4 days since his hospital discharge.  Check BMP today.  If renal function allows would look to reinitiate ACE inhibitor and MRA.  He would benefit from transition to ARNI, though has reported lightheadedness and dizziness previously with Entresto .  Recommend low-sodium diet.  For now, continue amlodipine , did not  titrate dose in an effort to minimize lower extremity swelling, along with carvedilol  25 mg twice daily.  HLD: LDL 147 in 08/2023.  He reports adherence to pharmacotherapy.  May need to consider PCSK9 inhibitor.  Remains on rosuvastatin  40 mg and ezetimibe .  Acute on CKD IIIa: Check BMP.  Obesity with asthma, OSA, and tracheobronchomalacia: Suspect this is likely contributing to his overall presentation.  Follow-up with PCP.   Informed Consent   Shared Decision Making/Informed Consent{  The risks [stroke (1 in 1000), death (1 in 1000), kidney failure [usually temporary] (1 in 500), bleeding (1 in  200), allergic reaction [possibly serious] (1 in 200)], benefits (diagnostic support and management of coronary artery disease) and alternatives of a cardiac catheterization were discussed in detail with Mr. Francis and he is willing to proceed.        Disposition: F/u with Dr. Darliss or an APP 1 to 2 weeks after cardiac cath.   Medication Adjustments/Labs and Tests Ordered: Current medicines are reviewed at length with the patient today.  Concerns regarding medicines are outlined above. Medication changes, Labs and Tests ordered today are summarized above and listed in the Patient Instructions accessible in Encounters.   Signed, Bernardino Bring, PA-C 10/28/2023 4:13 PM     [facility] [place] [address] [place], [place] [address] [phone]

## 2023-10-28 NOTE — Progress Notes (Signed)
 PCP: Adeline Hone, PA-C (last seen 12/24) Primary Cardiologist: Constancia Delton, MD/ Varney Gentleman, PA (last seen 01/25)  Chief Complaint: fatigue  HPI:   Cory Rivas is a 58 y/o male with a history of DM, hyperlipidemia, tracheobronchomalacia, nonobstructive CAD, HTN, gout, COPD, asthma, CKD, dyslipidemia, tobacco use and chronic heart failure.   Admitted 08/23/22 due to dyspnea, productive cough of brown sputum, mild sore throat, fatigue, dizziness and chest soreness. Steroid burst given. Initially given IV lasix  with transition to oral diuretics. Lisinopril  held due to AKI. Discharged after 2 days. Admitted 11/22/22 due to acute on chronic HF. Weaned off of bipap.Was in the ED 03/01/23 due to neck pain after falling off his tractor after he hit a stump. He landed on his right side.Head CT/ cervical spine xrays were negative. Was in the ED 03/09/23 due to atypical chest pain. Chest CTA negative for PE. Was in the ED 03/22/23 due to nonspecific chest pain. Workup negative. Was in the ED 03/23/23 due to hyperglycemia with glucose of >400.    Admitted 05/27/23 due to acute onset of chest pain, shortness of breath and diaphoresis. Blood pressure was significantly elevated to 204/102 on admission. Troponins were mildly elevated up to 26.  Cardiology was consulted and he underwent cardiac catheterization 8/14 which revealed non-obstructive coronary artery disease. Meds adjusted. Chest x-ray without acute cardiopulmonary process. Echo on 05/28/2023 showed an EF of 50 to 55%, no regional wall motion abnormalities, moderate LVH, grade 3 diastolic dysfunction, elevated left atrial pressure, normal RV systolic function and ventricular cavity size, mildly dilated left atrium, mild mitral regurgitation, aortic valve sclerosis without evidence of stenosis, and an estimated right atrial pressure of 3 mmHg.   Admitted 08/19/23 with chest pain/ pressure for 2 days located in the front chest. Worse with deep breath. Has shortness of  breath and cough along with this. Negative D-dimer. Troponin 23 => 22. 1 dose of IV Lasix  as BNP 143.9. Glucose 409. Was in the ED 08/31/23 with central chest pressure along with shortness of breath & cough. Labs reassuring with normal troponin. Negative D-dimer. CXR concerning for pneumonia so antibiotics provided.   Admitted 10/21/23 due to acute hypoxic respiratory failure secondary to COPD exacerbation and acute on chronic HFpEF. Found to have worsening CKD. High-sensitivity troponin 21 with a delta troponin of 20 subsequently trended to 19. BNP 331. D-dimer negative. Supplemental oxygen given. Initially given IV lasix  with transition to increased home torsemide . Lisinopril , spironolactone , and torsemide  were held due to AKI.          Echo 11/04/20: EF 60-65% with Grade I DD, mild LVH/ LAE and normal PA pressure of 31.6 mmHg Echo 08/23/22: EF of 60-65% along with mild LVH & mild Cory.  Echo 05/28/23: EF 50-55% with moderate LVH, Grade III DD and mild Cory  RHC/LHC 05/29/23: Moderate, nonobstructive coronary artery disease with sequential 50-60% ostial and proximal LAD stenoses that are not hemodynamically significant (RFR = 0.93).  There is also a 40% ostial stenosis of a large first OM branch.  Nondominant RCA is without significant disease. Upper normal to mildly elevated left and right heart filling pressures. Mild pulmonary hypertension. Borderline low Fick cardiac output/index. Mild aortic stenosis.  He presents today for a HF follow-up visit with a chief complaint of moderate fatigue with exertion. Has constant chest discomfort, weakness, constant dizziness/ lightheadedness, decreased appetite and continued neck pain along with this. Denies any shortness of breath today, palpitations, cough, edema or weight gain. Sleeping in recliner due to  neck pain. Says that some days, he gets so short of breath that he can't walk from his bedroom to the bathroom but then other days, like today, he feels good and  was able to walk into the office from the parking lot without having to stop and rest.   Glucose running 178-200's. Did not take any of his medications today as he is having a R/LHC later today.   ROS: All systems negative except as listed in HPI, PMH and Problem List.  SH:  Social History   Socioeconomic History   Marital status: Divorced    Spouse name: Not on file   Number of children: 0   Years of education: 9   Highest education level: 9th grade  Occupational History   Occupation: disability  Tobacco Use   Smoking status: Some Days    Current packs/day: 0.00    Average packs/day: 1 pack/day for 37.0 years (37.0 ttl pk-yrs)    Types: Cigarettes    Start date: 10/19/1989    Last attempt to quit: 08/20/2022    Years since quitting: 1.1   Smokeless tobacco: Never   Tobacco comments:    30+ years hx smoking 2 ppd, stopped smoking briefly in April 2022 for a surgery and then restarted smoking about 1/2 ppd last 4-5 months         5-6cigs daily- 04/10/2023  Vaping Use   Vaping status: Never Used  Substance and Sexual Activity   Alcohol use: No   Drug use: No   Sexual activity: Not Currently    Partners: Female  Other Topics Concern   Not on file  Social History Narrative   Lives with sister Cory Rivas, brother in law Cory Rivas), and his mother - Cory Rivas   Social Drivers of Health   Financial Resource Strain: Medium Risk (12/06/2022)   Overall Financial Resource Strain (CARDIA)    Difficulty of Paying Living Expenses: Somewhat hard  Food Insecurity: No Food Insecurity (10/23/2023)   Hunger Vital Sign    Worried About Running Out of Food in the Last Year: Never true    Ran Out of Food in the Last Year: Never true  Transportation Needs: Patient Declined (10/23/2023)   PRAPARE - Transportation    Lack of Transportation (Medical): Patient declined    Lack of Transportation (Non-Medical): Patient declined  Physical Activity: Insufficiently Active (12/06/2022)   Exercise Vital Sign     Days of Exercise per Week: 3 days    Minutes of Exercise per Session: 10 min  Stress: No Stress Concern Present (12/06/2022)   [employer] of Occupational Health - Occupational Stress Questionnaire    Feeling of Stress : Only a little  Social Connections: Socially Isolated (12/06/2022)   Social Connection and Isolation Panel [NHANES]    Frequency of Communication with Friends and Family: More than three times a week    Frequency of Social Gatherings with Friends and Family: Twice a week    Attends Religious Services: Never    Database administrator or Organizations: No    Attends Banker Meetings: Never    Marital Status: Divorced  Catering manager Violence: Not At Risk (10/23/2023)   Humiliation, Afraid, Rape, and Kick questionnaire    Fear of Current or Ex-Partner: No    Emotionally Abused: No    Physically Abused: No    Sexually Abused: No    FH:  Family History  Problem Relation Age of Onset   Heart disease Mother  Diabetes Mother    Hyperlipidemia Mother    Hypertension Mother    Heart attack Mother    Lung cancer Father    Hypertension Father    Stroke Father    AAA (abdominal aortic aneurysm) Maternal Grandmother    Heart attack Maternal Grandmother    Diabetes Maternal Grandfather     Past Medical History:  Diagnosis Date   Bulging of cervical intervertebral disc    CHF (congestive heart failure) (HCC)    Diabetes mellitus without complication (HCC)    Gout    High cholesterol    Hypertension    Stress due to family tension 04/15/2019    Current Outpatient Medications  Medication Sig Dispense Refill   allopurinol  (ZYLOPRIM ) 300 MG tablet TAKE ONE TABLET BY MOUTH DAILY WITH 100 MG TABLET. FOR A TOTAL OF 400 MG DAILY FOR GOUT 90 tablet 1   amLODipine  (NORVASC ) 5 MG tablet Take 1 tablet (5 mg total) by mouth daily. 30 tablet 0   aspirin  EC 81 MG tablet Take 1 tablet (81 mg total) by mouth daily. Swallow whole. 30 tablet 0    budesonide -formoterol  (SYMBICORT ) 160-4.5 MCG/ACT inhaler Inhale 2 puffs into the lungs 2 (two) times daily. 1 each 12   carvedilol  (COREG ) 25 MG tablet Take 1 tablet (25 mg total) by mouth 2 (two) times daily. 180 tablet 2   DULoxetine  (CYMBALTA ) 30 MG capsule Take 60 mg (2 capsules) poqam and 30 mg (1 capsule) poqpm daily 270 capsule 1   empagliflozin  (JARDIANCE ) 10 MG TABS tablet Take 1 tablet (10 mg total) by mouth daily. 90 tablet 1   ezetimibe  (ZETIA ) 10 MG tablet TAKE ONE TABLET BY MOUTH DAILY 90 tablet 3   fluticasone  (FLONASE ) 50 MCG/ACT nasal spray Place 1 spray into both nostrils daily. 18.2 mL 6   insulin  glargine (LANTUS  SOLOSTAR) 100 UNIT/ML Solostar Pen Inject 20 Units into the skin at bedtime.     insulin  lispro (HUMALOG) 100 UNIT/ML KwikPen Inject 8 Units into the skin 3 (three) times daily. Sliding scale up to 45 units per injection     ipratropium-albuterol  (DUONEB) 0.5-2.5 (3) MG/3ML SOLN Take 3 mLs by nebulization every 6 (six) hours as needed (SOB wheeze cough). 180 mL 1   levocetirizine (XYZAL ) 5 MG tablet TAKE ONE TABLET BY MOUTH EVERY EVENING 90 tablet 1   [START ON 10/29/2023] lisinopril  (ZESTRIL ) 40 MG tablet Take 1 tablet (40 mg total) by mouth daily. (Patient not taking: Reported on 10/28/2023)     metformin  (FORTAMET ) 500 MG (OSM) 24 hr tablet Take 2,000 mg by mouth at bedtime.     metFORMIN  (GLUCOPHAGE -XR) 500 MG 24 hr tablet Take 1,000 mg by mouth 2 (two) times daily.     pantoprazole  (PROTONIX ) 40 MG tablet Take 1 tablet (40 mg total) by mouth daily. 90 tablet 1   pregabalin  (LYRICA ) 50 MG capsule TAKE ONE CAPSULE (50 MG TOTAL) BY MOUTH TWO TIMES DAILY. 60 capsule 2   rosuvastatin  (CRESTOR ) 40 MG tablet TAKE ONE TABLET BY MOUTH DAILY 90 tablet 3   [START ON 10/29/2023] spironolactone  (ALDACTONE ) 25 MG tablet Take 1 tablet (25 mg total) by mouth daily. (Patient not taking: Reported on 10/28/2023)     Tiotropium Bromide  Monohydrate (SPIRIVA  RESPIMAT) 2.5 MCG/ACT AERS  Inhale 2 puffs into the lungs daily. 30 each 11   [START ON 10/29/2023] Torsemide  40 MG TABS Take 40 mg by mouth daily. Take 1 additional 40 mg tablet on Mon, Wed, and Friday morning. (Patient not  taking: Reported on 10/28/2023)     TRULICITY  3 MG/0.5ML SOPN Inject 3 mg into the skin once a week. SATURDAYS     VENTOLIN  HFA 108 (90 Base) MCG/ACT inhaler INHALE TWO PUFFS INTO THE LUNGS EVERY SIX HOURS AS NEEDED FOR WHEEZING OR SHORTNESS OF BREATH 18 each 2   No current facility-administered medications for this visit.   Vitals:   10/30/23 0917  BP: (!) 170/83  Pulse: 63  Resp: 14  SpO2: 99%  Weight: 220 lb 2 oz (99.8 kg)   Wt Readings from Last 3 Encounters:  10/30/23 220 lb 2 oz (99.8 kg)  10/28/23 217 lb 12.8 oz (98.8 kg)  10/24/23 226 lb 6.6 oz (102.7 kg)   Lab Results  Component Value Date   CREATININE 1.41 (H) 10/28/2023   CREATININE 1.99 (H) 10/24/2023   CREATININE 2.18 (H) 10/23/2023    PHYSICAL EXAM:  General:  Well appearing. No resp difficulty.  HEENT: negative Neck: supple. JVP flat although difficult to assess due to body habitus. No lymphadenopathy or thryomegaly appreciated. Cor: PMI normal. Regular rate & rhythm. No rubs, gallops. 2/6 systolic murmur at LSB. Lungs: clear Abdomen: soft, nontender, nondistended. No hepatosplenomegaly. No bruits or masses.  Extremities: no cyanosis, clubbing, rash, trace pitting edema bilateral lower legs Neuro: alert & oriented x 3, cranial nerves grossly intact. Moves all 4 extremities w/o difficulty. Affect pleasant.   ECG: not done  ReDs:  38% (previous reading 37%)   ASSESSMENT & PLAN:  1: NICM with preserved ejection fraction with LVH- - suscpect due to HTN and OSA - NYHA class III - euvolemic today - weighing daily; reminded to call for an overnight weight gain of >2 pounds or a weekly weight gain of > 5 pounds - weight down 12 pounds from last visit here 2 weeks ago; reports a decreased appetite since recent  admission - ReDs 38% (previous reading was 37%) - Echo 11/04/20: EF 60-65% with Grade I DD, mild LVH/ LAE and normal PA pressure of 31.6 mmHg - Echo 08/23/22: EF of 60-65% along with mild LVH & mild Cory. - Echo 05/28/23: EF 50-55% with moderate LVH, Grade III DD and mild Cory - continue carvedilol  25mg  BID - continue jardiance  10mg  QD - continue torsemide  40mg  daily with additional 40mg  on M, W, F - lisinopril  and spironolactone  continues to be hold since discharge; having R/LHC later today so will defer resumption until that is done - drinking less fluids and is trying to keep it closer to 60-64 ounces - BNP 10/28/23 was 39.49  2: HTN- - BP 170/83; no meds taken yet today - continue amlodipine  5mg  daily - saw PCP Cory Rivas) 12/24  - BMP 10/28/23 reviewed and showed sodium 139, potassium 5.0, creatinine 1.41 and GFR 58  3: DM- - A1c 08/09/23 was 9.8% - saw endocrinology (Cory Rivas) 07/24 - 03/21/23 microalbumin/ creat ratio was 1338 - home glucose running 170's-200's  4: Reactive airway disease- - saw pulmonology (Cory Rivas) 06/24 - using spiriva   - continues to not smoke - CPAP was returned as he wasn't wearing it long enough each night  5: Hyperlipidemia- - continue ezetimibe  10mg  daily - continue rosuvastatin  40mg  daily - lipid panel 08/09/23 showed LDL 147, triglycerides 404  6: CAD- - saw cardiology ([place]) 01/25 - R/LHC later today - continue ezetimibe  10mg  daily - continue rosuvastatin  40mg  daily - RHC/LHC 05/29/23:   Moderate, nonobstructive coronary artery disease with sequential 50-60% ostial and proximal LAD stenoses that are not hemodynamically significant (RFR =  0.93).  There is also a 40% ostial stenosis of a large first OM branch.  Nondominant RCA is without   significant disease.   Upper normal to mildly elevated left and right heart filling pressures.   Mild pulmonary hypertension.   Borderline low Fick cardiac output/index.   Mild aortic stenosis.   Return in 3  months, sooner if needed.

## 2023-10-28 NOTE — Progress Notes (Signed)
 Cardiology Office Note    Date:  10/28/2023   ID:  Cory Rivas, DOB 02-27-1966, MRN [id number]  PCP:  Leavy Mole, PA-C  Cardiologist:  Redell Cave, MD  Electrophysiologist:  None   Chief Complaint: Hospital follow up  History of Present Illness:   Cory Rivas is a 58 y.o. male with history of nonobstructive CAD, HFpEF, CKD stage IIIa, IDDM, HTN, HLD, cervical myelopathy, idiopathic chronic gout, tobacco use for 35+years, asthma, obesity with OSA and tracheobronchomalacia who presents for hospital follow-up following recent admission to [facility] from 1/6-10/24/2023 for acute hypoxic respiratory failure in the setting of COPD exacerbation and acute on chronic HFpEF complicated by AKI.  In the setting of preoperative cardiac risk stratification for murmur he underwent Lexiscan  MPI in 09/2020 which showed no significant ischemia or coronary artery calcification on CT imaging.  Echo in 10/2020 showed an EF of 60 to 65%, no regional wall motion abnormalities, grade 1 diastolic dysfunction, and no significant valvular abnormalities.  Repeat echo in 08/2022 showed an EF of 60 to 65%, no regional wall motion normalities, mild LVH, grade 2 diastolic dysfunction, normal RV systolic function and ventricular cavity size mild mitral regurgitation, and aortic valve sclerosis without evidence of stenosis.   He was admitted to the hospital in 05/2023 with chest pain and shortness of breath with diaphoresis after eating dinner.  He was markedly hypertensive upon arrival with a blood pressure of 210/103.  High-sensitivity troponin 26 trending to 24.  BNP 199.  Chest x-ray without acute cardiopulmonary process.  Echo on 05/28/2023 showed an EF of 50 to 55%, no regional wall motion abnormalities, moderate LVH, grade 3 diastolic dysfunction, elevated left atrial pressure, normal RV systolic function and ventricular cavity size, mildly dilated left atrium, mild mitral regurgitation, aortic valve sclerosis without  evidence of stenosis, and an estimated right atrial pressure of 3 mmHg.  R/LHC on 05/29/2023 showed moderate, nonobstructive CAD with sequential 50 to 60% ostial and proximal LAD stenoses that were not hemodynamically significant (RFR 0.93).  There was also 40% ostial stenosis of a large OM1 branch.  The nondominant RCA was without significant disease.  Upper normal to mildly elevated left and right heart filling pressures.  Mild pulmonary hypertension.  Borderline low cardiac output/index.  Mild aortic stenosis.  Gentle diuresis and medical therapy/risk factor modification were recommended.     He was seen in hospital follow-up on 06/21/2023 and was without symptoms of angina or cardiac decompensation.  Weight was down 7 pounds by our scale.  He was adherent and tolerating cardiac medications.  He continued to smoke approximately 1/2 pack of cigarettes per day, was only taking 1 or 2 drags then letting the cigarette burn out.  Carvedilol  was titrated to 12.5 mg twice daily with continuation of Jardiance , lisinopril , spironolactone , and torsemide .     He was admitted to the hospital in early 08/2023 with hypertensive urgency with blood pressure 195/95 with associated chest pressure and dyspnea with cough productive of yellow sputum.  He reported eating some chicken wings leading up to this hospitalization.  Reported adherence to medications.  High-sensitivity troponin 23 with a delta troponin of 22.  D-dimer negative.  BNP 143.  Chest x-ray without acute cardiopulmonary process.  EKG showed sinus rhythm with occasional PACs with known inferolateral T wave inversion.  He reported good urine output with IV Lasix .  With improvement in blood pressure, and chest pressure also improved.  Mildly elevated troponin felt to be reflective of supply/demand ischemia  in the context of volume overload and hypertensive urgency.  Recent LHC in 05/2023 showed nonobstructive CAD as outlined above.  He was seen in the ED in 08/2023  with chest pain similar to his prior evaluations and admissions.  High-sensitivity troponin negative x 1.  D-dimer negative.  Chest x-ray notable for patchy left lower opacity representative of possible atelectasis or pneumonia.  He was discharged with antibiotic therapy.  He was recently admitted to [facility] from 1/6-10/24/2023 with acute hypoxic respiratory failure secondary to COPD exacerbation and acute on chronic HFpEF with admission complicated by acute on CKD stage IIIa.  There was concern for dietary indiscretion, patient denies this.  High-sensitivity troponin 21 with a delta troponin of 20 subsequently trended to 19.  BNP 331.  D-dimer negative.  He was treated with supplemental oxygen, IV Lasix  in the ER followed by increase in home torsemide  to 40 mg twice daily, Solu-Medrol , DuoNebs, and Rocephin  and azithromycin .  At time of discharge, his lisinopril , spironolactone , and torsemide  were held due to AKI.  At time of discharge, patient continued to note some dyspnea and chest discomfort along with dizziness.  He comes in today continuing to note chest discomfort and dyspnea that is unchanged compared to his hospital presentation earlier this month.  He reports adherence to his medications leading up to his admission and indicates he has not smoked a cigarette since sometime in November.  No significant lower extremity swelling.  He sleeps in a recliner at baseline.  He does note some early satiety.  His weight is down 6 pounds today by our scale when compared to his last visit in our office in 08/2023.  He denies dietary indiscretion.  No syncope.   Labs independently reviewed: 10/2023 - magnesium  2.2, Hgb 15.4, PLT 198, potassium 3.6, BUN 45, serum creatinine 1.99, albumin 3.8, AST/ALT normal 08/2023 - direct LDL 147 07/2023 - A1c 9.8, TC 241, TG 404, HDL 38 04/2023 - TSH normal  Past Medical History:  Diagnosis Date   Bulging of cervical intervertebral disc    CHF (congestive heart failure)  (HCC)    Diabetes mellitus without complication (HCC)    Gout    High cholesterol    Hypertension    Stress due to family tension 04/15/2019    Past Surgical History:  Procedure Laterality Date   BACK SURGERY     CARDIAC CATHETERIZATION     CHOLECYSTECTOMY     COLONOSCOPY WITH PROPOFOL  N/A 03/10/2020   Procedure: COLONOSCOPY WITH PROPOFOL ;  Surgeon: Jinny Carmine, MD;  Location: [facility] ENDOSCOPY;  Service: Endoscopy;  Laterality: N/A;   COLONOSCOPY WITH PROPOFOL  N/A 09/25/2023   Procedure: COLONOSCOPY WITH PROPOFOL ;  Surgeon: Jinny Carmine, MD;  Location: [facility] ENDOSCOPY;  Service: Endoscopy;  Laterality: N/A;   CORONARY PRESSURE/FFR STUDY N/A 05/29/2023   Procedure: CORONARY PRESSURE/FFR STUDY;  Surgeon: Mady Bruckner, MD;  Location: [facility] INVASIVE CV LAB;  Service: Cardiovascular;  Laterality: N/A;   POLYPECTOMY  09/25/2023   Procedure: POLYPECTOMY;  Surgeon: Jinny Carmine, MD;  Location: [facility] ENDOSCOPY;  Service: Endoscopy;;   RIGHT/LEFT HEART CATH AND CORONARY ANGIOGRAPHY N/A 05/29/2023   Procedure: RIGHT/LEFT HEART CATH AND CORONARY ANGIOGRAPHY;  Surgeon: Mady Bruckner, MD;  Location: [facility] INVASIVE CV LAB;  Service: Cardiovascular;  Laterality: N/A;    Current Medications: Current Meds  Medication Sig   allopurinol  (ZYLOPRIM ) 300 MG tablet TAKE ONE TABLET BY MOUTH DAILY WITH 100 MG TABLET. FOR A TOTAL OF 400 MG DAILY FOR GOUT   amLODipine  (NORVASC ) 5 MG  tablet Take 1 tablet (5 mg total) by mouth daily.   aspirin  EC 81 MG tablet Take 1 tablet (81 mg total) by mouth daily. Swallow whole.   budesonide -formoterol  (SYMBICORT ) 160-4.5 MCG/ACT inhaler Inhale 2 puffs into the lungs 2 (two) times daily.   carvedilol  (COREG ) 25 MG tablet Take 1 tablet (25 mg total) by mouth 2 (two) times daily.   DULoxetine  (CYMBALTA ) 30 MG capsule Take 60 mg (2 capsules) poqam and 30 mg (1 capsule) poqpm daily   empagliflozin  (JARDIANCE ) 10 MG TABS tablet Take 1 tablet (10 mg total) by mouth daily.    ezetimibe  (ZETIA ) 10 MG tablet TAKE ONE TABLET BY MOUTH DAILY   fluticasone  (FLONASE ) 50 MCG/ACT nasal spray Place 1 spray into both nostrils daily.   insulin  glargine (LANTUS  SOLOSTAR) 100 UNIT/ML Solostar Pen Inject 20 Units into the skin at bedtime.   insulin  lispro (HUMALOG ) 100 UNIT/ML KwikPen Inject 8 Units into the skin 3 (three) times daily. Sliding scale up to 45 units per injection   ipratropium-albuterol  (DUONEB) 0.5-2.5 (3) MG/3ML SOLN Take 3 mLs by nebulization every 6 (six) hours as needed (SOB wheeze cough).   levocetirizine (XYZAL ) 5 MG tablet TAKE ONE TABLET BY MOUTH EVERY EVENING   metformin  (FORTAMET ) 500 MG (OSM) 24 hr tablet Take 2,000 mg by mouth at bedtime.   metFORMIN  (GLUCOPHAGE -XR) 500 MG 24 hr tablet Take 1,000 mg by mouth 2 (two) times daily.   pantoprazole  (PROTONIX ) 40 MG tablet Take 1 tablet (40 mg total) by mouth daily.   pregabalin  (LYRICA ) 50 MG capsule TAKE ONE CAPSULE (50 MG TOTAL) BY MOUTH TWO TIMES DAILY.   rosuvastatin  (CRESTOR ) 40 MG tablet TAKE ONE TABLET BY MOUTH DAILY   Tiotropium Bromide  Monohydrate (SPIRIVA  RESPIMAT) 2.5 MCG/ACT AERS Inhale 2 puffs into the lungs daily.   TRULICITY  3 MG/0.5ML SOPN Inject 3 mg into the skin once a week. SATURDAYS   VENTOLIN  HFA 108 (90 Base) MCG/ACT inhaler INHALE TWO PUFFS INTO THE LUNGS EVERY SIX HOURS AS NEEDED FOR WHEEZING OR SHORTNESS OF BREATH    Allergies:   Oxycodone , Entresto  [sacubitril -valsartan ], Onion, and Nitroglycerin    Social History   Socioeconomic History   Marital status: Divorced    Spouse name: Not on file   Number of children: 0   Years of education: 9   Highest education level: 9th grade  Occupational History   Occupation: disability  Tobacco Use   Smoking status: Some Days    Current packs/day: 0.00    Average packs/day: 1 pack/day for 37.0 years (37.0 ttl pk-yrs)    Types: Cigarettes    Start date: 10/19/1989    Last attempt to quit: 08/20/2022    Years since quitting: 1.1    Smokeless tobacco: Never   Tobacco comments:    30+ years hx smoking 2 ppd, stopped smoking briefly in April 2022 for a surgery and then restarted smoking about 1/2 ppd last 4-5 months         5-6cigs daily- 04/10/2023  Vaping Use   Vaping status: Never Used  Substance and Sexual Activity   Alcohol use: No   Drug use: No   Sexual activity: Not Currently    Partners: Female  Other Topics Concern   Not on file  Social History Narrative   Lives with sister [name], brother in law [name]), and his mother - Edan Juday   Social Drivers of Health   Financial Resource Strain: Medium Risk (12/06/2022)   Overall Financial Resource Strain (CARDIA)  Difficulty of Paying Living Expenses: Somewhat hard  Food Insecurity: No Food Insecurity (10/23/2023)   Hunger Vital Sign    Worried About Running Out of Food in the Last Year: Never true    Ran Out of Food in the Last Year: Never true  Transportation Needs: Patient Declined (10/23/2023)   PRAPARE - Transportation    Lack of Transportation (Medical): Patient declined    Lack of Transportation (Non-Medical): Patient declined  Physical Activity: Insufficiently Active (12/06/2022)   Exercise Vital Sign    Days of Exercise per Week: 3 days    Minutes of Exercise per Session: 10 min  Stress: No Stress Concern Present (12/06/2022)   [employer] of Occupational Health - Occupational Stress Questionnaire    Feeling of Stress : Only a little  Social Connections: Socially Isolated (12/06/2022)   Social Connection and Isolation Panel [NHANES]    Frequency of Communication with Friends and Family: More than three times a week    Frequency of Social Gatherings with Friends and Family: Twice a week    Attends Religious Services: Never    Database Administrator or Organizations: No    Attends Engineer, Structural: Never    Marital Status: Divorced     Family History:  The patient's family history includes AAA (abdominal aortic aneurysm) in his  maternal grandmother; Diabetes in his maternal grandfather and mother; Heart attack in his maternal grandmother and mother; Heart disease in his mother; Hyperlipidemia in his mother; Hypertension in his father and mother; Lung cancer in his father; Stroke in his father.  ROS:   12-point review of systems is negative unless otherwise noted in the HPI.   EKGs/Labs/Other Studies Reviewed:    Studies reviewed were summarized above. The additional studies were reviewed today:  [facility] 05/29/2023: Conclusions: Moderate, nonobstructive coronary artery disease with sequential 50-60% ostial and proximal LAD stenoses that are not hemodynamically significant (RFR = 0.93).  There is also a 40% ostial stenosis of a large first OM branch.  Nondominant RCA is without significant disease. Upper normal to mildly elevated left and right heart filling pressures. Mild pulmonary hypertension. Borderline low Fick cardiac output/index. Mild aortic stenosis.   Recommendations: Resume gentle diuresis tomorrow if renal function is stable. Medical therapy and risk factor modification to prevent progression of coronary artery disease. __________   2D echo 05/28/2023: 1. Left ventricular ejection fraction, by estimation, is 50 to 55%. The  left ventricle has low normal function. The left ventricle has no regional  wall motion abnormalities. There is moderate left ventricular hypertrophy.  Left ventricular diastolic  parameters are consistent with Grade III diastolic dysfunction  (restrictive). Elevated left atrial pressure. The average left ventricular  global longitudinal strain is -10.0 %. The global longitudinal strain is  abnormal.   2. Right ventricular systolic function is normal. The right ventricular  size is normal. Tricuspid regurgitation signal is inadequate for assessing  PA pressure.   3. Left atrial size was mildly dilated.   4. The mitral valve was not well visualized. Mild mitral valve   regurgitation.   5. The aortic valve has an indeterminant number of cusps. There is  moderate calcification of the aortic valve. There is moderate thickening  of the aortic valve. Aortic valve regurgitation is not visualized. Aortic  valve sclerosis/calcification is  present, without any evidence of aortic stenosis.   6. The inferior vena cava is normal in size with greater than 50%  respiratory variability, suggesting right atrial pressure of  3 mmHg.  __________   2D echo 08/23/2022: 1. Left ventricular ejection fraction, by estimation, is 60 to 65%. The  left ventricle has normal function. The left ventricle has no regional  wall motion abnormalities. There is mild left ventricular hypertrophy.  Left ventricular diastolic parameters  are consistent with Grade II diastolic dysfunction (pseudonormalization).   2. Right ventricular systolic function is normal. The right ventricular  size is normal.   3. The mitral valve is normal in structure. Mild mitral valve  regurgitation. No evidence of mitral stenosis.   4. The aortic valve is calcified. Aortic valve regurgitation is not  visualized. Aortic valve sclerosis/calcification is present, without any  evidence of aortic stenosis.   5. The inferior vena cava is normal in size with greater than 50%  respiratory variability, suggesting right atrial pressure of 3 mmHg.  __________   2D echo 11/04/2020: 1. Left ventricular ejection fraction, by estimation, is 60 to 65%. The  left ventricle has normal function. The left ventricle has no regional  wall motion abnormalities. Left ventricular diastolic parameters are  consistent with Grade I diastolic  dysfunction (impaired relaxation).There is mild left ventricular  hypertrophy of the basal segment. LVOT gradient of 10 mm Hg at rest, 22 mm  Hg gradient with valsalva   2. Right ventricular systolic function is normal. The right ventricular  size is normal. There is normal pulmonary artery  systolic pressure. The  estimated right ventricular systolic pressure is 31.6 mmHg.   3. Left atrial size was mildly dilated.  ____________   Lexiscan  MPI 10/10/2020: Low risk, probably normal pharmacologic myocardial perfusion stress tst. There is a small in size, moderate in severity, fixed defect at the apex most likely representing artifact (apical thinning and attenuation) and less likely scar. There is no evidence of significant ischemia. The left ventricular ejection fraction is normal (59%). There is no significant coronary artery calcification on the attenuation correction CT.   EKG:  EKG is ordered today.  The EKG ordered today demonstrates NSR, 77 bpm, lateral T wave inversion consistent with prior tracing  Recent Labs: 10/21/2023: B Natriuretic Peptide 331.6 10/22/2023: ALT 23 10/24/2023: BUN 45; Creatinine, Ser 1.99; Hemoglobin 15.4; Magnesium  2.2; Platelets 198; Potassium 3.6; Sodium 135  Recent Lipid Panel    Component Value Date/Time   CHOL 241 (H) 08/09/2023 1156   TRIG 404 (H) 08/09/2023 1156   HDL 38 (L) 08/09/2023 1156   CHOLHDL 6.3 (H) 08/09/2023 1156   VLDL 53 (H) 06/08/2022 1534   LDLCALC  08/09/2023 1156     Comment:     . LDL cholesterol not calculated. Triglyceride levels greater than 400 mg/dL invalidate calculated LDL results. . Reference range: <100 . Desirable range <100 mg/dL for primary prevention;   <70 mg/dL for patients with CHD or diabetic patients  with > or = 2 CHD risk factors. SABRA LDL-C is now calculated using the Martin-Hopkins  calculation, which is a validated novel method providing  better accuracy than the Friedewald equation in the  estimation of LDL-C.  Gladis APPLETHWAITE et al. SANDREA. 7986;689(80): 2061-2068  (http://education.[employer].com/faq/FAQ164)    LDLDIRECT 147 (H) 08/19/2023 2222    PHYSICAL EXAM:    VS:  BP (!) 178/99 (BP Location: Left Arm, Patient Position: Sitting, Cuff Size: Normal)   Pulse 77   Ht 5' 6 (1.676 m)    Wt 217 lb 12.8 oz (98.8 kg)   SpO2 96%   BMI 35.15 kg/m   BMI: Body mass index is 35.15  kg/m.  Physical Exam Vitals reviewed.  Constitutional:      Appearance: He is well-developed.  HENT:     Head: Normocephalic and atraumatic.  Eyes:     General:        Right eye: No discharge.        Left eye: No discharge.  Neck:     Comments: JVD difficult to assess secondary to body habitus. Cardiovascular:     Rate and Rhythm: Normal rate and regular rhythm.     Heart sounds: S1 normal and S2 normal. Heart sounds not distant. No midsystolic click and no opening snap. Murmur heard.     Systolic murmur is present with a grade of 1/6 at the upper right sternal border.     No friction rub.  Pulmonary:     Effort: Pulmonary effort is normal. No respiratory distress.     Breath sounds: Normal breath sounds. No decreased breath sounds, wheezing, rhonchi or rales.     Comments: Diminished breath sounds bilaterally. Chest:     Chest wall: No tenderness.  Abdominal:     General: There is no distension.  Musculoskeletal:     Cervical back: Normal range of motion.     Comments: Mild bilateral pretibial edema.  Skin:    General: Skin is warm and dry.     Nails: There is no clubbing.  Neurological:     Mental Status: He is alert and oriented to person, place, and time.  Psychiatric:        Speech: Speech normal.        Behavior: Behavior normal.        Thought Content: Thought content normal.        Judgment: Judgment normal.     Wt Readings from Last 3 Encounters:  10/28/23 217 lb 12.8 oz (98.8 kg)  10/24/23 226 lb 6.6 oz (102.7 kg)  10/17/23 232 lb (105.2 kg)     ASSESSMENT & PLAN:   CAD involving the native coronary arteries with elevated high-sensitivity troponin and exertional dyspnea: He has continued to note symptoms of exertional dyspnea and chest pain leading to multiple ED visits and hospitalizations where he was found to have a mildly elevated, though flat trending  high-sensitivity troponin.  Given recurrent admissions, and with persistent symptoms noted at this time, we will pursue diagnostic R/LHC to evaluate for any change in his coronary anatomy and to help guide pharmacotherapy moving forward.  Continue aspirin  81 mg, amlodipine  5 mg, carvedilol  25 mg twice daily, ezetimibe  10 mg, and rosuvastatin  40 mg.  HFpEF: Volume status is difficult to assess on physical exam secondary to body habitus.  His weight is down 6 pounds today when compared to his last clinic visit.  Spironolactone  and torsemide  have been held since hospital discharge in the setting of AKI.  Check BMP with resumption of pharmacotherapy as tolerated.  Remains on Jardiance  10 mg.  Consider rechallenge of ARNI (previously noted dizziness on Entresto ).  Suspected nonadherence to CPAP is also contributing to symptoms.  CHF education.  HTN: Blood pressure is poorly controlled in the office today.  Suspect some of this is related to his lisinopril  and spironolactone  having been held the past 4 days since his hospital discharge.  Check BMP today.  If renal function allows would look to reinitiate ACE inhibitor and MRA.  He would benefit from transition to ARNI, though has reported lightheadedness and dizziness previously with Entresto .  Recommend low-sodium diet.  For now, continue amlodipine , did not  titrate dose in an effort to minimize lower extremity swelling, along with carvedilol  25 mg twice daily.  HLD: LDL 147 in 08/2023.  He reports adherence to pharmacotherapy.  May need to consider PCSK9 inhibitor.  Remains on rosuvastatin  40 mg and ezetimibe .  Acute on CKD IIIa: Check BMP.  Obesity with asthma, OSA, and tracheobronchomalacia: Suspect this is likely contributing to his overall presentation.  Follow-up with PCP.   Informed Consent   Shared Decision Making/Informed Consent{  The risks [stroke (1 in 1000), death (1 in 1000), kidney failure [usually temporary] (1 in 500), bleeding (1 in  200), allergic reaction [possibly serious] (1 in 200)], benefits (diagnostic support and management of coronary artery disease) and alternatives of a cardiac catheterization were discussed in detail with Mr. Francis and he is willing to proceed.        Disposition: F/u with Dr. Darliss or an APP 1 to 2 weeks after cardiac cath.   Medication Adjustments/Labs and Tests Ordered: Current medicines are reviewed at length with the patient today.  Concerns regarding medicines are outlined above. Medication changes, Labs and Tests ordered today are summarized above and listed in the Patient Instructions accessible in Encounters.   Signed, Bernardino Bring, PA-C 10/28/2023 4:13 PM     [facility] [place] [address] [place], [place] [address] [phone]

## 2023-10-29 LAB — BASIC METABOLIC PANEL
BUN/Creatinine Ratio: 16 (ref 9–20)
BUN: 23 mg/dL (ref 6–24)
CO2: 24 mmol/L (ref 20–29)
Calcium: 9.6 mg/dL (ref 8.7–10.2)
Chloride: 98 mmol/L (ref 96–106)
Creatinine, Ser: 1.41 mg/dL — ABNORMAL HIGH (ref 0.76–1.27)
Glucose: 130 mg/dL — ABNORMAL HIGH (ref 70–99)
Potassium: 5 mmol/L (ref 3.5–5.2)
Sodium: 139 mmol/L (ref 134–144)
eGFR: 58 mL/min/{1.73_m2} — ABNORMAL LOW (ref >59)

## 2023-10-29 LAB — CBC
Hematocrit: 51.6 % — ABNORMAL HIGH (ref 37.5–51.0)
Hemoglobin: 17.2 g/dL (ref 13.0–17.7)
MCH: 29 pg (ref 26.6–33.0)
MCHC: 33.3 g/dL (ref 31.5–35.7)
MCV: 87 fL (ref 79–97)
Platelets: 198 10*3/uL (ref 150–450)
RBC: 5.93 x10E6/uL — ABNORMAL HIGH (ref 4.14–5.80)
RDW: 13.3 % (ref 11.6–15.4)
WBC: 14.8 10*3/uL — ABNORMAL HIGH (ref 3.4–10.8)

## 2023-10-29 LAB — BRAIN NATRIURETIC PEPTIDE: BNP: 39.4 pg/mL (ref 0.0–100.0)

## 2023-10-30 ENCOUNTER — Telehealth: Payer: Self-pay | Admitting: Family

## 2023-10-30 ENCOUNTER — Ambulatory Visit
Admission: RE | Admit: 2023-10-30 | Discharge: 2023-10-30 | Disposition: A | Discharge status: Home / Self Care | Payer: Medicaid Other | Attending: Internal Medicine | Admitting: Internal Medicine

## 2023-10-30 ENCOUNTER — Encounter
Admission: RE | Disposition: A | Discharge status: Home / Self Care | Payer: Self-pay | Source: Home / Self Care | Attending: Internal Medicine

## 2023-10-30 ENCOUNTER — Other Ambulatory Visit: Payer: Self-pay

## 2023-10-30 ENCOUNTER — Ambulatory Visit (HOSPITAL_BASED_OUTPATIENT_CLINIC_OR_DEPARTMENT_OTHER): Payer: Medicaid Other | Admitting: Family

## 2023-10-30 ENCOUNTER — Encounter: Payer: Self-pay | Admitting: Internal Medicine

## 2023-10-30 ENCOUNTER — Encounter: Payer: Self-pay | Admitting: Family

## 2023-10-30 VITALS — BP 165/85 | HR 63 | Resp 14 | Wt 220.1 lb

## 2023-10-30 DIAGNOSIS — I5033 Acute on chronic diastolic (congestive) heart failure: Secondary | ICD-10-CM | POA: Insufficient documentation

## 2023-10-30 DIAGNOSIS — Z7984 Long term (current) use of oral hypoglycemic drugs: Secondary | ICD-10-CM | POA: Insufficient documentation

## 2023-10-30 DIAGNOSIS — N1831 Chronic kidney disease, stage 3a: Secondary | ICD-10-CM | POA: Diagnosis not present

## 2023-10-30 DIAGNOSIS — M109 Gout, unspecified: Secondary | ICD-10-CM | POA: Insufficient documentation

## 2023-10-30 DIAGNOSIS — Z79899 Other long term (current) drug therapy: Secondary | ICD-10-CM | POA: Insufficient documentation

## 2023-10-30 DIAGNOSIS — I272 Pulmonary hypertension, unspecified: Secondary | ICD-10-CM | POA: Insufficient documentation

## 2023-10-30 DIAGNOSIS — Z7985 Long-term (current) use of injectable non-insulin antidiabetic drugs: Secondary | ICD-10-CM | POA: Insufficient documentation

## 2023-10-30 DIAGNOSIS — E7800 Pure hypercholesterolemia, unspecified: Secondary | ICD-10-CM | POA: Insufficient documentation

## 2023-10-30 DIAGNOSIS — J4489 Other specified chronic obstructive pulmonary disease: Secondary | ICD-10-CM | POA: Insufficient documentation

## 2023-10-30 DIAGNOSIS — I10 Essential (primary) hypertension: Secondary | ICD-10-CM

## 2023-10-30 DIAGNOSIS — I2510 Atherosclerotic heart disease of native coronary artery without angina pectoris: Secondary | ICD-10-CM | POA: Diagnosis not present

## 2023-10-30 DIAGNOSIS — I350 Nonrheumatic aortic (valve) stenosis: Secondary | ICD-10-CM | POA: Insufficient documentation

## 2023-10-30 DIAGNOSIS — F17210 Nicotine dependence, cigarettes, uncomplicated: Secondary | ICD-10-CM | POA: Insufficient documentation

## 2023-10-30 DIAGNOSIS — Z794 Long term (current) use of insulin: Secondary | ICD-10-CM | POA: Insufficient documentation

## 2023-10-30 DIAGNOSIS — R7989 Other specified abnormal findings of blood chemistry: Secondary | ICD-10-CM

## 2023-10-30 DIAGNOSIS — I428 Other cardiomyopathies: Secondary | ICD-10-CM | POA: Insufficient documentation

## 2023-10-30 DIAGNOSIS — E782 Mixed hyperlipidemia: Secondary | ICD-10-CM | POA: Diagnosis not present

## 2023-10-30 DIAGNOSIS — N189 Chronic kidney disease, unspecified: Secondary | ICD-10-CM | POA: Insufficient documentation

## 2023-10-30 DIAGNOSIS — Z7951 Long term (current) use of inhaled steroids: Secondary | ICD-10-CM | POA: Insufficient documentation

## 2023-10-30 DIAGNOSIS — J45909 Unspecified asthma, uncomplicated: Secondary | ICD-10-CM | POA: Diagnosis not present

## 2023-10-30 DIAGNOSIS — E119 Type 2 diabetes mellitus without complications: Secondary | ICD-10-CM | POA: Diagnosis not present

## 2023-10-30 DIAGNOSIS — I5032 Chronic diastolic (congestive) heart failure: Secondary | ICD-10-CM

## 2023-10-30 DIAGNOSIS — Z7982 Long term (current) use of aspirin: Secondary | ICD-10-CM | POA: Diagnosis not present

## 2023-10-30 DIAGNOSIS — E1122 Type 2 diabetes mellitus with diabetic chronic kidney disease: Secondary | ICD-10-CM | POA: Insufficient documentation

## 2023-10-30 DIAGNOSIS — E669 Obesity, unspecified: Secondary | ICD-10-CM | POA: Insufficient documentation

## 2023-10-30 DIAGNOSIS — R079 Chest pain, unspecified: Secondary | ICD-10-CM

## 2023-10-30 DIAGNOSIS — G4733 Obstructive sleep apnea (adult) (pediatric): Secondary | ICD-10-CM | POA: Insufficient documentation

## 2023-10-30 DIAGNOSIS — Z5986 Financial insecurity: Secondary | ICD-10-CM | POA: Diagnosis not present

## 2023-10-30 DIAGNOSIS — Z6835 Body mass index (BMI) 35.0-35.9, adult: Secondary | ICD-10-CM | POA: Insufficient documentation

## 2023-10-30 DIAGNOSIS — E785 Hyperlipidemia, unspecified: Secondary | ICD-10-CM | POA: Insufficient documentation

## 2023-10-30 DIAGNOSIS — I130 Hypertensive heart and chronic kidney disease with heart failure and stage 1 through stage 4 chronic kidney disease, or unspecified chronic kidney disease: Secondary | ICD-10-CM | POA: Insufficient documentation

## 2023-10-30 DIAGNOSIS — I110 Hypertensive heart disease with heart failure: Secondary | ICD-10-CM

## 2023-10-30 HISTORY — PX: RIGHT/LEFT HEART CATH AND CORONARY ANGIOGRAPHY: CATH118266

## 2023-10-30 LAB — POCT I-STAT EG7
Acid-base deficit: 1 mmol/L (ref 0.0–2.0)
Bicarbonate: 25.1 mmol/L (ref 20.0–28.0)
Calcium, Ion: 1.21 mmol/L (ref 1.15–1.40)
HCT: 44 % (ref 39.0–52.0)
Hemoglobin: 15 g/dL (ref 13.0–17.0)
O2 Saturation: 67 %
Potassium: 4.1 mmol/L (ref 3.5–5.1)
Sodium: 139 mmol/L (ref 135–145)
TCO2: 27 mmol/L (ref 22–32)
pCO2, Ven: 46.8 mm[Hg] (ref 44–60)
pH, Ven: 7.338 (ref 7.25–7.43)
pO2, Ven: 38 mm[Hg] (ref 32–45)

## 2023-10-30 LAB — POCT I-STAT 7, (LYTES, BLD GAS, ICA,H+H)
Acid-base deficit: 2 mmol/L (ref 0.0–2.0)
Bicarbonate: 23.7 mmol/L (ref 20.0–28.0)
Calcium, Ion: 1.2 mmol/L (ref 1.15–1.40)
HCT: 43 % (ref 39.0–52.0)
Hemoglobin: 14.6 g/dL (ref 13.0–17.0)
O2 Saturation: 93 %
Potassium: 4.2 mmol/L (ref 3.5–5.1)
Sodium: 140 mmol/L (ref 135–145)
TCO2: 25 mmol/L (ref 22–32)
pCO2 arterial: 42.2 mm[Hg] (ref 32–48)
pH, Arterial: 7.356 (ref 7.35–7.45)
pO2, Arterial: 70 mm[Hg] — ABNORMAL LOW (ref 83–108)

## 2023-10-30 LAB — GLUCOSE, CAPILLARY
Glucose-Capillary: 105 mg/dL — ABNORMAL HIGH (ref 70–99)
Glucose-Capillary: 96 mg/dL (ref 70–99)

## 2023-10-30 SURGERY — RIGHT/LEFT HEART CATH AND CORONARY ANGIOGRAPHY
Anesthesia: Moderate Sedation | Laterality: Bilateral

## 2023-10-30 MED ORDER — SODIUM CHLORIDE 0.9 % IV SOLN: INTRAVENOUS | Status: AC

## 2023-10-30 MED ORDER — ASPIRIN 81 MG PO CHEW
CHEWABLE_TABLET | ORAL | Status: AC
Start: 2023-10-30 — End: ?
  Filled 2023-10-30: qty 1

## 2023-10-30 MED ORDER — ASPIRIN 81 MG PO CHEW
81.0000 mg | CHEWABLE_TABLET | ORAL | Status: AC
  Administered 2023-10-30: 81 mg via ORAL

## 2023-10-30 MED ORDER — HEPARIN SODIUM (PORCINE) 1000 UNIT/ML IJ SOLN
INTRAMUSCULAR | Status: DC | PRN
  Administered 2023-10-30: 5000 [IU] via INTRAVENOUS

## 2023-10-30 MED ORDER — HEPARIN SODIUM (PORCINE) 1000 UNIT/ML IJ SOLN
INTRAMUSCULAR | Status: AC
  Filled 2023-10-30: qty 10

## 2023-10-30 MED ORDER — HEPARIN (PORCINE) IN NACL 1000-0.9 UT/500ML-% IV SOLN
INTRAVENOUS | Status: DC | PRN
  Administered 2023-10-30 (×2): 500 mL

## 2023-10-30 MED ORDER — LABETALOL HCL 5 MG/ML IV SOLN
INTRAVENOUS | Status: AC
  Filled 2023-10-30: qty 4

## 2023-10-30 MED ORDER — LABETALOL HCL 5 MG/ML IV SOLN
10.0000 mg | INTRAVENOUS | Status: DC | PRN
  Administered 2023-10-30: 10 mg via INTRAVENOUS

## 2023-10-30 MED ORDER — FENTANYL CITRATE (PF) 100 MCG/2ML IJ SOLN
INTRAMUSCULAR | Status: AC
  Filled 2023-10-30: qty 2

## 2023-10-30 MED ORDER — SODIUM CHLORIDE 0.9% FLUSH: 3.0000 mL | INTRAVENOUS | Status: DC | PRN

## 2023-10-30 MED ORDER — SODIUM CHLORIDE 0.9% FLUSH: 3.0000 mL | Freq: Two times a day (BID) | INTRAVENOUS | Status: DC

## 2023-10-30 MED ORDER — FENTANYL CITRATE (PF) 100 MCG/2ML IJ SOLN
INTRAMUSCULAR | Status: DC | PRN
  Administered 2023-10-30: 25 ug via INTRAVENOUS

## 2023-10-30 MED ORDER — VERAPAMIL HCL 2.5 MG/ML IV SOLN
INTRAVENOUS | Status: DC | PRN
  Administered 2023-10-30 (×2): 2.5 mg via INTRA_ARTERIAL

## 2023-10-30 MED ORDER — LIDOCAINE HCL 1 % IJ SOLN
INTRAMUSCULAR | Status: AC
  Filled 2023-10-30: qty 20

## 2023-10-30 MED ORDER — AMLODIPINE BESYLATE 10 MG PO TABS: 10.0000 mg | ORAL_TABLET | Freq: Every day | ORAL | 5 refills | Status: DC

## 2023-10-30 MED ORDER — MIDAZOLAM HCL 2 MG/2ML IJ SOLN
INTRAMUSCULAR | Status: DC | PRN
  Administered 2023-10-30: 1 mg via INTRAVENOUS

## 2023-10-30 MED ORDER — VERAPAMIL HCL 2.5 MG/ML IV SOLN
INTRAVENOUS | Status: AC
  Filled 2023-10-30: qty 2

## 2023-10-30 MED ORDER — HYDRALAZINE HCL 20 MG/ML IJ SOLN: 10.0000 mg | INTRAMUSCULAR | Status: DC | PRN

## 2023-10-30 MED ORDER — HEPARIN (PORCINE) IN NACL 1000-0.9 UT/500ML-% IV SOLN
INTRAVENOUS | Status: AC
  Filled 2023-10-30: qty 1000

## 2023-10-30 MED ORDER — IOHEXOL 300 MG/ML  SOLN
INTRAMUSCULAR | Status: DC | PRN
  Administered 2023-10-30: 23 mL

## 2023-10-30 MED ORDER — LIDOCAINE HCL (PF) 1 % IJ SOLN
INTRAMUSCULAR | Status: DC | PRN
  Administered 2023-10-30: 5 mL

## 2023-10-30 MED ORDER — ACETAMINOPHEN 325 MG PO TABS: 650.0000 mg | ORAL_TABLET | ORAL | Status: DC | PRN

## 2023-10-30 MED ORDER — SODIUM CHLORIDE 0.9 % IV SOLN: 250.0000 mL | INTRAVENOUS | Status: DC | PRN

## 2023-10-30 MED ORDER — MIDAZOLAM HCL 2 MG/2ML IJ SOLN
INTRAMUSCULAR | Status: AC
  Filled 2023-10-30: qty 2

## 2023-10-30 MED ORDER — ONDANSETRON HCL 4 MG/2ML IJ SOLN: 4.0000 mg | Freq: Four times a day (QID) | INTRAMUSCULAR | Status: DC | PRN

## 2023-10-30 MED ORDER — SODIUM CHLORIDE 0.9 % IV SOLN: INTRAVENOUS | Status: DC

## 2023-10-30 SURGICAL SUPPLY — 12 items
CATH 5FR JL3.5 JR4 ANG PIG MP (CATHETERS) IMPLANT
CATH BALLN WEDGE 5F 110CM (CATHETERS) IMPLANT
DEVICE RAD TR BAND REGULAR (VASCULAR PRODUCTS) IMPLANT
DRAPE BRACHIAL (DRAPES) IMPLANT
GLIDESHEATH SLEND SS 6F .021 (SHEATH) IMPLANT
GUIDEWIRE INQWIRE 1.5J.035X260 (WIRE) IMPLANT
INQWIRE 1.5J .035X260CM (WIRE) ×1 IMPLANT
PACK CARDIAC CATH (CUSTOM PROCEDURE TRAY) ×2 IMPLANT
PROTECTION STATION PRESSURIZED (MISCELLANEOUS) ×1 IMPLANT
SET ATX-X65L (MISCELLANEOUS) IMPLANT
SHEATH GLIDE SLENDER 4/5FR (SHEATH) IMPLANT
STATION PROTECTION PRESSURIZED (MISCELLANEOUS) IMPLANT

## 2023-10-30 NOTE — Telephone Encounter (Signed)
 Patient had cardiac cath today and ordering labs were put in incorrectly as myself as the ordering provider. Informed Dr. Nolan Battle, who did the cath, that the results were back for his review.

## 2023-10-30 NOTE — Interval H&P Note (Signed)
 History and Physical Interval Note:  10/30/2023 2:08 PM  Cory Rivas  has presented today for surgery, with the diagnosis of chest pain and HFpEF.  The various methods of treatment have been discussed with the patient and family. After consideration of risks, benefits and other options for treatment, the patient has consented to  Procedure(s): RIGHT/LEFT HEART CATH AND CORONARY ANGIOGRAPHY (Bilateral) as a surgical intervention.  The patient's history has been reviewed, patient examined, no change in status, stable for surgery.  I have reviewed the patient's chart and labs.  Questions were answered to the patient's satisfaction.    Cath Lab Visit (complete for each Cath Lab visit)  Clinical Evaluation Leading to the Procedure:   ACS: No.  Non-ACS:    Anginal Classification: CCS IV  Anti-ischemic medical therapy: Minimal Therapy (1 class of medications)  Non-Invasive Test Results: No non-invasive testing performed  Prior CABG: No previous CABG  Cory Rivas

## 2023-10-30 NOTE — Progress Notes (Signed)
 ReDS Vest / Clip - 10/30/23 4098       ReDS Vest / Clip   Station Marker D    Ruler Value 39    ReDS Value Range Moderate volume overload    ReDS Actual Value 38

## 2023-10-30 NOTE — Patient Instructions (Signed)
 It was good to see you today!

## 2023-10-31 ENCOUNTER — Encounter: Payer: Self-pay | Admitting: Internal Medicine

## 2023-10-31 ENCOUNTER — Other Ambulatory Visit: Payer: Self-pay | Admitting: Physician Assistant

## 2023-10-31 DIAGNOSIS — I25118 Atherosclerotic heart disease of native coronary artery with other forms of angina pectoris: Secondary | ICD-10-CM

## 2023-10-31 DIAGNOSIS — N189 Chronic kidney disease, unspecified: Secondary | ICD-10-CM

## 2023-10-31 DIAGNOSIS — I10 Essential (primary) hypertension: Secondary | ICD-10-CM

## 2023-10-31 DIAGNOSIS — G4733 Obstructive sleep apnea (adult) (pediatric): Secondary | ICD-10-CM

## 2023-10-31 DIAGNOSIS — R0609 Other forms of dyspnea: Secondary | ICD-10-CM

## 2023-10-31 DIAGNOSIS — I5032 Chronic diastolic (congestive) heart failure: Secondary | ICD-10-CM

## 2023-10-31 DIAGNOSIS — R7989 Other specified abnormal findings of blood chemistry: Secondary | ICD-10-CM

## 2023-10-31 DIAGNOSIS — E66812 Obesity, class 2: Secondary | ICD-10-CM

## 2023-10-31 DIAGNOSIS — N179 Acute kidney failure, unspecified: Secondary | ICD-10-CM

## 2023-10-31 DIAGNOSIS — E785 Hyperlipidemia, unspecified: Secondary | ICD-10-CM

## 2023-10-31 DIAGNOSIS — I2089 Other forms of angina pectoris: Secondary | ICD-10-CM

## 2023-10-31 MED ORDER — DEXCOM G7 RECEIVER DEVI: 0 refills | Status: DC

## 2023-10-31 MED ORDER — DEXCOM G7 SENSOR MISC: 1 refills | Status: DC

## 2023-11-01 ENCOUNTER — Ambulatory Visit: Payer: Medicaid Other | Admitting: Family Medicine

## 2023-11-01 ENCOUNTER — Encounter: Payer: Self-pay | Admitting: Family Medicine

## 2023-11-01 VITALS — BP 136/80 | HR 75 | Temp 98.0°F | Resp 18 | Ht 66.0 in | Wt 222.8 lb

## 2023-11-01 DIAGNOSIS — N179 Acute kidney failure, unspecified: Secondary | ICD-10-CM | POA: Diagnosis not present

## 2023-11-01 DIAGNOSIS — I10 Essential (primary) hypertension: Secondary | ICD-10-CM | POA: Diagnosis not present

## 2023-11-01 DIAGNOSIS — I5032 Chronic diastolic (congestive) heart failure: Secondary | ICD-10-CM

## 2023-11-01 DIAGNOSIS — Z794 Long term (current) use of insulin: Secondary | ICD-10-CM

## 2023-11-01 DIAGNOSIS — Z09 Encounter for follow-up examination after completed treatment for conditions other than malignant neoplasm: Secondary | ICD-10-CM | POA: Diagnosis not present

## 2023-11-01 DIAGNOSIS — E1165 Type 2 diabetes mellitus with hyperglycemia: Secondary | ICD-10-CM

## 2023-11-01 DIAGNOSIS — J4489 Other specified chronic obstructive pulmonary disease: Secondary | ICD-10-CM | POA: Diagnosis not present

## 2023-11-01 MED ORDER — GVOKE HYPOPEN 2-PACK 1 MG/0.2ML ~~LOC~~ SOAJ: 1.0000 mg | SUBCUTANEOUS | 5 refills | Status: DC | PRN

## 2023-11-01 NOTE — Progress Notes (Signed)
Name: Cory Rivas   MRN: [id number]    DOB: [birth year]   Date:11/01/2023       Progress Note  Chief Complaint  Patient presents with   Hospitalization Follow-up    Subjective:   Cory Rivas is a 58 y.o. male, presents to clinic for HFU Admitted 1/6-1/9 Cardiac cath done 1/15 Worse CP after illness/flu/pneumonia per pt report  Holding lisinopril, demedex and spironolactone Just and saw cardiology Monday  Eula Listen PA-C: 1/13 He was recently admitted to [facility] from 1/6-10/24/2023 with acute hypoxic respiratory failure secondary to COPD exacerbation and acute on chronic HFpEF with admission complicated by acute on CKD stage IIIa.  There was concern for dietary indiscretion, patient denies this.  High-sensitivity troponin 21 with a delta troponin of 20 subsequently trended to 19.  BNP 331.  D-dimer negative.  He was treated with supplemental oxygen, IV Lasix in the ER followed by increase in home torsemide to 40 mg twice daily, Solu-Medrol, DuoNebs, and Rocephin and azithromycin.  At time of discharge, his lisinopril, spironolactone, and torsemide were held due to AKI.  At time of discharge, patient continued to note some dyspnea and chest discomfort along with dizziness.    Weight when leaving hospital 218, today 222 No LE edema  IDDM Lab Results  Component Value Date   HGBA1C 9.8 (H) 08/09/2023    Hypertension:  Currently managed on Norvasc 10, lisinopril 40 (currently being held due to AKI) and for CHF Demadex 40 mg and spironolactone 25 also held right now  BP Readings from Last 3 Encounters:  11/01/23 136/80  10/30/23 128/77  10/30/23 (!) 165/85   Pt denies CP, SOB, exertional sx, LE edema, palpitation, Ha's, visual disturbances, lightheadedness, hypotension, syncope. Dietary efforts for BP?  Low-salt   Wt Readings from Last 5 Encounters:  11/01/23 222 lb 12.8 oz (101.1 kg)  10/30/23 218 lb 4.8 oz (99 kg)  10/30/23 220 lb 2 oz (99.8 kg)  10/28/23 217 lb 12.8 oz  (98.8 kg)  10/24/23 226 lb 6.6 oz (102.7 kg)   BMI Readings from Last 5 Encounters:  11/01/23 35.96 kg/m  10/30/23 35.23 kg/m  10/30/23 35.53 kg/m  10/28/23 35.15 kg/m  10/24/23 36.54 kg/m    IDDM -this week Dexcom sensors and reader's were sent in for him to the pharmacy He is managed by endocrinology   Current Outpatient Medications:    allopurinol (ZYLOPRIM) 300 MG tablet, TAKE ONE TABLET BY MOUTH DAILY WITH 100 MG TABLET. FOR A TOTAL OF 400 MG DAILY FOR GOUT, Disp: 90 tablet, Rfl: 1   amLODipine (NORVASC) 10 MG tablet, Take 1 tablet (10 mg total) by mouth daily., Disp: 30 tablet, Rfl: 5   aspirin EC 81 MG tablet, Take 1 tablet (81 mg total) by mouth daily. Swallow whole., Disp: 30 tablet, Rfl: 0   budesonide-formoterol (SYMBICORT) 160-4.5 MCG/ACT inhaler, Inhale 2 puffs into the lungs 2 (two) times daily., Disp: 1 each, Rfl: 12   carvedilol (COREG) 25 MG tablet, Take 1 tablet (25 mg total) by mouth 2 (two) times daily., Disp: 180 tablet, Rfl: 2   Continuous Glucose Receiver (DEXCOM G7 RECEIVER) DEVI, Dispense one reciever to be used with 10 d devices for IDDM, Disp: 1 each, Rfl: 0   Continuous Glucose Sensor (DEXCOM G7 SENSOR) MISC, Apply one device as directed x 10 d for continuous glucose monitoring, Disp: 9 each, Rfl: 1   DULoxetine (CYMBALTA) 30 MG capsule, Take 60 mg (2 capsules) poqam and 30 mg (1  capsule) poqpm daily, Disp: 270 capsule, Rfl: 1   empagliflozin (JARDIANCE) 10 MG TABS tablet, Take 1 tablet (10 mg total) by mouth daily., Disp: 90 tablet, Rfl: 1   ezetimibe (ZETIA) 10 MG tablet, TAKE ONE TABLET BY MOUTH DAILY, Disp: 90 tablet, Rfl: 3   fluticasone (FLONASE) 50 MCG/ACT nasal spray, Place 1 spray into both nostrils daily., Disp: 18.2 mL, Rfl: 6   insulin glargine (LANTUS SOLOSTAR) 100 UNIT/ML Solostar Pen, Inject 20 Units into the skin at bedtime., Disp: , Rfl:    insulin lispro (HUMALOG) 100 UNIT/ML KwikPen, Inject 8 Units into the skin 3 (three) times daily.  Sliding scale up to 45 units per injection, Disp: , Rfl:    ipratropium-albuterol (DUONEB) 0.5-2.5 (3) MG/3ML SOLN, Take 3 mLs by nebulization every 6 (six) hours as needed (SOB wheeze cough)., Disp: 180 mL, Rfl: 1   levocetirizine (XYZAL) 5 MG tablet, TAKE ONE TABLET BY MOUTH EVERY EVENING, Disp: 90 tablet, Rfl: 1   metFORMIN (GLUCOPHAGE-XR) 500 MG 24 hr tablet, Take 1,000 mg by mouth 2 (two) times daily., Disp: , Rfl:    pantoprazole (PROTONIX) 40 MG tablet, Take 1 tablet (40 mg total) by mouth daily., Disp: 90 tablet, Rfl: 1   pregabalin (LYRICA) 50 MG capsule, TAKE ONE CAPSULE (50 MG TOTAL) BY MOUTH TWO TIMES DAILY., Disp: 60 capsule, Rfl: 2   rosuvastatin (CRESTOR) 40 MG tablet, TAKE ONE TABLET BY MOUTH DAILY, Disp: 90 tablet, Rfl: 3   Tiotropium Bromide Monohydrate (SPIRIVA RESPIMAT) 2.5 MCG/ACT AERS, Inhale 2 puffs into the lungs daily., Disp: 30 each, Rfl: 11   TRULICITY 3 MG/0.5ML SOPN, Inject 3 mg into the skin once a week. SATURDAYS, Disp: , Rfl:    VENTOLIN HFA 108 (90 Base) MCG/ACT inhaler, INHALE TWO PUFFS INTO THE LUNGS EVERY SIX HOURS AS NEEDED FOR WHEEZING OR SHORTNESS OF BREATH, Disp: 18 each, Rfl: 2  Patient Active Problem List   Diagnosis Date Noted   COPD with acute exacerbation (HCC) 10/21/2023   History of colon polyps 09/25/2023   Myocardial injury 08/19/2023   Type II diabetes mellitus with renal manifestations (HCC) 08/19/2023   Obesity (BMI 30-39.9) 08/19/2023   HLD (hyperlipidemia) 08/19/2023   Chronic diastolic CHF (congestive heart failure) (HCC) 08/19/2023   Diarrhea 08/19/2023   CAD (coronary artery disease) 08/19/2023   CKD (chronic kidney disease) stage 2, GFR 60-89 ml/min 08/19/2023   Acute on chronic diastolic heart failure (HCC) 05/30/2023   Unstable angina (HCC) 05/29/2023   Chest pain 05/27/2023   Type 2 diabetes mellitus with peripheral neuropathy (HCC) 05/27/2023   Gastroesophageal reflux disease without esophagitis 05/27/2023   COPD with asthma  (HCC) 05/27/2023   Dyslipidemia 05/27/2023   Acute decompensated heart failure (HCC) 11/23/2022   Hypertensive emergency 11/22/2022   Acute respiratory failure with hypoxia (HCC) 11/22/2022   Elevated troponin 11/22/2022   Chronic heart failure with preserved ejection fraction (HCC) 08/24/2022   Acute on chronic diastolic congestive heart failure (HCC) 08/23/2022   Class 2 obesity 08/23/2022   Type 2 diabetes mellitus with hyperglycemia (HCC) 08/23/2022   Grade I diastolic dysfunction 06/08/2022   LVH (left ventricular hypertrophy) 06/08/2022   Accelerated hypertension 06/08/2022   S/P cervical spinal fusion 08/18/2021   Former heavy tobacco smoker 08/18/2021   Gout 08/18/2021   Other chronic pain 10/26/2020   Systolic murmur 10/26/2020   Tobacco use 10/26/2020   Polyp of ascending colon    Rectal polyp    Class 2 severe obesity with serious comorbidity  and body mass index (BMI) of 39.0 to 39.9 in adult [facility]) 04/15/2019   Chronic kidney disease (CKD) stage G2/A3, mildly decreased glomerular filtration rate (GFR) between 60-89 mL/min/1.73 square meter and albuminuria creatinine ratio greater than 300 mg/g 04/15/2019   Proteinuria 04/15/2019   Hyperlipidemia associated with type 2 diabetes mellitus (HCC) 03/18/2019   Diabetes mellitus (HCC) 03/18/2019   Idiopathic chronic gout of multiple sites without tophus 03/18/2019   Essential hypertension 03/18/2019   Dermatitis 03/18/2019   Lumbar degenerative disc disease 11/22/2015   Cervical myelopathy (HCC) 11/22/2015   Osteoarthritis of spine with radiculopathy, lumbar region 11/22/2015   Degenerative disc disease, cervical 11/22/2015   Osteoarthritis of spine with radiculopathy, cervical region 11/22/2015    Past Surgical History:  Procedure Laterality Date   BACK SURGERY     CARDIAC CATHETERIZATION     CHOLECYSTECTOMY     COLONOSCOPY WITH PROPOFOL N/A 03/10/2020   Procedure: COLONOSCOPY WITH PROPOFOL;  Surgeon: Midge Minium, MD;   Location: [facility] ENDOSCOPY;  Service: Endoscopy;  Laterality: N/A;   COLONOSCOPY WITH PROPOFOL N/A 09/25/2023   Procedure: COLONOSCOPY WITH PROPOFOL;  Surgeon: Midge Minium, MD;  Location: [facility] ENDOSCOPY;  Service: Endoscopy;  Laterality: N/A;   CORONARY PRESSURE/FFR STUDY N/A 05/29/2023   Procedure: CORONARY PRESSURE/FFR STUDY;  Surgeon: Yvonne Kendall, MD;  Location: [facility] INVASIVE CV LAB;  Service: Cardiovascular;  Laterality: N/A;   POLYPECTOMY  09/25/2023   Procedure: POLYPECTOMY;  Surgeon: Midge Minium, MD;  Location: [facility] ENDOSCOPY;  Service: Endoscopy;;   RIGHT/LEFT HEART CATH AND CORONARY ANGIOGRAPHY N/A 05/29/2023   Procedure: RIGHT/LEFT HEART CATH AND CORONARY ANGIOGRAPHY;  Surgeon: Yvonne Kendall, MD;  Location: [facility] INVASIVE CV LAB;  Service: Cardiovascular;  Laterality: N/A;   RIGHT/LEFT HEART CATH AND CORONARY ANGIOGRAPHY Bilateral 10/30/2023   Procedure: RIGHT/LEFT HEART CATH AND CORONARY ANGIOGRAPHY;  Surgeon: Yvonne Kendall, MD;  Location: [facility] INVASIVE CV LAB;  Service: Cardiovascular;  Laterality: Bilateral;    Family History  Problem Relation Age of Onset   Heart disease Mother    Diabetes Mother    Hyperlipidemia Mother    Hypertension Mother    Heart attack Mother    Lung cancer Father    Hypertension Father    Stroke Father    AAA (abdominal aortic aneurysm) Maternal Grandmother    Heart attack Maternal Grandmother    Diabetes Maternal Grandfather     Social History   Tobacco Use   Smoking status: Some Days    Current packs/day: 0.00    Average packs/day: 1 pack/day for 37.0 years (37.0 ttl pk-yrs)    Types: Cigarettes    Start date: 10/19/1989    Last attempt to quit: 08/20/2022    Years since quitting: 1.2   Smokeless tobacco: Never   Tobacco comments:    30+ years hx smoking 2 ppd, stopped smoking briefly in April 2022 for a surgery and then restarted smoking about 1/2 ppd last 4-5 months         5-6cigs daily- 04/10/2023  Vaping Use   Vaping  status: Never Used  Substance Use Topics   Alcohol use: No   Drug use: No     Allergies  Allergen Reactions   Oxycodone Nausea And Vomiting   Entresto [Sacubitril-Valsartan] Nausea Only and Other (See Comments)    Lightheaded and dizzy    Onion Nausea And Vomiting   Nitroglycerin Itching and Rash    Health Maintenance  Topic Date Due   COVID-19 Vaccine (4 - 2024-25 season) 11/17/2023 (Originally 06/16/2023)  HEMOGLOBIN A1C  02/07/2024   OPHTHALMOLOGY EXAM  03/04/2024   Lung Cancer Screening  03/08/2024   Diabetic kidney evaluation - Urine ACR  03/20/2024   FOOT EXAM  08/27/2024   Diabetic kidney evaluation - eGFR measurement  10/27/2024   Colonoscopy  09/24/2028   DTaP/Tdap/Td (2 - Td or Tdap) 04/14/2029   Pneumococcal Vaccine 65-63 Years old  Completed   INFLUENZA VACCINE  Completed   Hepatitis C Screening  Completed   HIV Screening  Completed   Zoster Vaccines- Shingrix  Completed   HPV VACCINES  Aged Out    Chart Review Today: I personally reviewed active problem list, medication list, allergies, family history, social history, health maintenance, notes from last encounter, lab results, imaging with the patient/caregiver today.   Review of Systems  Constitutional: Negative.   HENT: Negative.    Eyes: Negative.   Respiratory: Negative.    Cardiovascular: Negative.   Gastrointestinal: Negative.   Endocrine: Negative.   Genitourinary: Negative.   Musculoskeletal: Negative.   Skin: Negative.   Allergic/Immunologic: Negative.   Neurological: Negative.   Hematological: Negative.   Psychiatric/Behavioral: Negative.    All other systems reviewed and are negative.    Objective:   Vitals:   11/01/23 0938  BP: 136/80  Pulse: 75  Resp: 18  Temp: 98 F (36.7 C)  SpO2: 93%  Weight: 222 lb 12.8 oz (101.1 kg)  Height: 5\' 6"  (1.676 m)    Body mass index is 35.96 kg/m.  Physical Exam Vitals and nursing note reviewed.  Constitutional:      General: He is not  in acute distress.    Appearance: Normal appearance. He is well-developed. He is obese. He is not ill-appearing, toxic-appearing or diaphoretic.  HENT:     Head: Normocephalic and atraumatic.     Nose: Nose normal.  Eyes:     General:        Right eye: No discharge.        Left eye: No discharge.     Conjunctiva/sclera: Conjunctivae normal.  Neck:     Trachea: No tracheal deviation.  Cardiovascular:     Rate and Rhythm: Normal rate and regular rhythm.     Pulses: Normal pulses.     Heart sounds: Normal heart sounds.  Pulmonary:     Effort: Pulmonary effort is normal. No respiratory distress.     Breath sounds: Normal breath sounds. No stridor.  Skin:    General: Skin is warm and dry.     Findings: No rash.  Neurological:     Mental Status: He is alert.     Motor: No abnormal muscle tone.     Coordination: Coordination normal.  Psychiatric:        Mood and Affect: Mood normal.        Behavior: Behavior normal.         Assessment & Plan:     ICD-10-CM   1. Encounter for examination following treatment at hospital  Z09 COMPLETE METABOLIC PANEL WITH GFR   Extensive review of hospitalization, results, labs and cardiac follow-up    2. AKI (acute kidney injury) (HCC)  N17.9 COMPLETE METABOLIC PANEL WITH GFR   Some improvement with labs earlier this week, recheck CMP, message sent to cardiology about resuming labs    3. Uncontrolled diabetes mellitus with hyperglycemia, with long-term current use of insulin (HCC)  E11.65 COMPLETE METABOLIC PANEL WITH GFR   Z79.4 GVOKE HYPOPEN 2-PACK 1 MG/0.2ML SOAJ   Managed by endocrinology, continue  meds, insulin, CGM Dexcom supplies sent in for him a few days ago    4. Essential hypertension  I10 COMPLETE METABOLIC PANEL WITH GFR   Blood pressure 136/80, history of labile hypertension, consulting with cardiology about resuming his medications    5. COPD with asthma (HCC)  J44.89 COMPLETE METABOLIC PANEL WITH GFR   Managed by  pulmonology, he would like allergy testing    6. Chronic diastolic CHF (congestive heart failure) (HCC)  I50.32 COMPLETE METABOLIC PANEL WITH GFR   per HF clinic/cardiology, low salt diet, limited fluid intake, weight 218 at hospital D/C, today 222, no orthopnea, PND, LE edema      Meds currently held include lisinopril, demedex and spironolactone Msg sent to cardiology to confer about resuming medications I am concerned that if he has any increased weight and may be fluid retention and he will need to be resuming his diuretics He has very labile hypertension he will likely also need to get back on the lisinopril His labs have already trended back towards baseline I am rechecking chemistry today to get a updated serum creatinine and GFR Lab Results  Component Value Date   CREATININE 1.41 (H) 10/28/2023   Lab Results  Component Value Date   BUN 23 10/28/2023   Lab Results  Component Value Date   GFRNONAA 39 (L) 10/24/2023   Lab Results  Component Value Date   EGFR 58 (L) 10/28/2023   EGFR 67 09/10/2023   EGFR 58 (L) 08/09/2023   EGFR 53 (L) 06/04/2023   EGFR 64 03/21/2023   EGFR 77 12/06/2022   Lab Results  Component Value Date   CREATININE 1.41 (H) 10/28/2023   CREATININE 1.99 (H) 10/24/2023   CREATININE 2.18 (H) 10/23/2023   CREATININE 1.47 (H) 10/22/2023   CREATININE 1.27 (H) 10/21/2023   CREATININE 1.33 (H) 10/17/2023     Toresmide 40 mg tab  Spironolactone 25 mg Lisinopril 40 mg   . Danelle Berry, PA-C 11/01/23 9:46 AM

## 2023-11-01 NOTE — Patient Instructions (Signed)
Toresmide 40 mg tab Spironolactone 25 mg Lisinopril 40 mg    I will check your labs and consult with cardiology Alycia Rossetti) to see what doses they recommend restarting

## 2023-11-02 LAB — COMPLETE METABOLIC PANEL WITH GFR
AG Ratio: 1.4 (calc) (ref 1.0–2.5)
ALT: 26 U/L (ref 9–46)
AST: 20 U/L (ref 10–35)
Albumin: 3.8 g/dL (ref 3.6–5.1)
Alkaline phosphatase (APISO): 68 U/L (ref 35–144)
BUN/Creatinine Ratio: 10 (calc) (ref 6–22)
BUN: 14 mg/dL (ref 7–25)
CO2: 26 mmol/L (ref 20–32)
Calcium: 9.3 mg/dL (ref 8.6–10.3)
Chloride: 105 mmol/L (ref 98–110)
Creat: 1.35 mg/dL — ABNORMAL HIGH (ref 0.70–1.30)
Globulin: 2.8 g/dL (ref 1.9–3.7)
Glucose, Bld: 163 mg/dL — ABNORMAL HIGH (ref 65–99)
Potassium: 4.6 mmol/L (ref 3.5–5.3)
Sodium: 141 mmol/L (ref 135–146)
Total Bilirubin: 0.7 mg/dL (ref 0.2–1.2)
Total Protein: 6.6 g/dL (ref 6.1–8.1)
eGFR: 62 mL/min/{1.73_m2} (ref >=60)

## 2023-11-03 ENCOUNTER — Encounter: Payer: Self-pay | Admitting: Family Medicine

## 2023-11-03 DIAGNOSIS — E1165 Type 2 diabetes mellitus with hyperglycemia: Secondary | ICD-10-CM

## 2023-11-04 MED ORDER — LISINOPRIL 40 MG PO TABS
40.0000 mg | ORAL_TABLET | Freq: Every day | ORAL | 1 refills | Status: DC
  Filled 2024-01-31: qty 90, 90d supply, fill #0
  Filled 2024-02-18: qty 30, 30d supply, fill #0
  Filled 2024-02-18: qty 90, 90d supply, fill #0

## 2023-11-04 MED ORDER — FREESTYLE LIBRE 3 READER DEVI: 0 refills | Status: DC

## 2023-11-04 MED ORDER — TORSEMIDE 40 MG PO TABS: 40.0000 mg | ORAL_TABLET | Freq: Every day | ORAL | 0 refills | Status: DC

## 2023-11-04 MED ORDER — FREESTYLE LIBRE 3 SENSOR MISC
1.0000 | 2 refills | Status: DC
  Filled 2024-01-31: qty 2, 28d supply, fill #0

## 2023-11-05 ENCOUNTER — Encounter: Payer: Self-pay | Admitting: Student in an Organized Health Care Education/Training Program

## 2023-11-05 ENCOUNTER — Ambulatory Visit: Payer: Medicaid Other | Admitting: Student in an Organized Health Care Education/Training Program

## 2023-11-05 VITALS — BP 132/88 | HR 60 | Temp 97.6°F | Ht 66.0 in | Wt 220.0 lb

## 2023-11-05 DIAGNOSIS — F17210 Nicotine dependence, cigarettes, uncomplicated: Secondary | ICD-10-CM

## 2023-11-05 DIAGNOSIS — I5032 Chronic diastolic (congestive) heart failure: Secondary | ICD-10-CM | POA: Diagnosis not present

## 2023-11-05 DIAGNOSIS — I272 Pulmonary hypertension, unspecified: Secondary | ICD-10-CM

## 2023-11-05 DIAGNOSIS — J4530 Mild persistent asthma, uncomplicated: Secondary | ICD-10-CM

## 2023-11-05 DIAGNOSIS — G4733 Obstructive sleep apnea (adult) (pediatric): Secondary | ICD-10-CM | POA: Diagnosis not present

## 2023-11-05 DIAGNOSIS — J398 Other specified diseases of upper respiratory tract: Secondary | ICD-10-CM

## 2023-11-05 DIAGNOSIS — F17200 Nicotine dependence, unspecified, uncomplicated: Secondary | ICD-10-CM

## 2023-11-05 DIAGNOSIS — Z720 Tobacco use: Secondary | ICD-10-CM

## 2023-11-05 DIAGNOSIS — R0602 Shortness of breath: Secondary | ICD-10-CM

## 2023-11-05 MED ORDER — BUDESONIDE-FORMOTEROL FUMARATE 160-4.5 MCG/ACT IN AERO
2.0000 | INHALATION_SPRAY | Freq: Two times a day (BID) | RESPIRATORY_TRACT | 12 refills | Status: DC
  Filled 2024-02-12: qty 10.2, 30d supply, fill #0
  Filled 2024-03-04 – 2024-03-17 (×4): qty 10.2, 30d supply, fill #1
  Filled 2024-04-09: qty 10.2, 30d supply, fill #2
  Filled 2024-05-29 – 2024-06-01 (×2): qty 10.2, 30d supply, fill #3

## 2023-11-05 MED ORDER — SPIRIVA RESPIMAT 2.5 MCG/ACT IN AERS: 2.0000 | INHALATION_SPRAY | Freq: Every day | RESPIRATORY_TRACT | 11 refills | Status: DC

## 2023-11-05 NOTE — Progress Notes (Signed)
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 Jallow has a history of childhood asthma and is presenting for follow up. His main symptoms were those of shortness of breath, cough, and wheeze.  Overall I suspect Mr. Ferre's symptoms are driven by multiple etiologies, but given significant improvement with diuresis, HFpEF is the primary etiology behind his multiple presentations. PFT's confirm reversible obstruction, and with history of childhood asthma as well as smoking, asthma vs COPD/Asthma overlap syndrome are a consideration and he is maintained on LABA/LAMA/ICS for this. I will continue Symbicort and Spiriva and re-send refills today.  He was initiated on CPAP for sleep apnea as well as for his tracheobronchomalacia but given non-compliance this was taken back. He will re-attempt to contact his DME provider and have the CPAP machine back.   -Continue Symbicort two puffs bid > refills sent -Continue Spiriva two puffs daily > refills sent -re-establish contact with DME for CPAP machine -continue levocetirizine -albuterol PRN -weight loss encouraged  #Tracheobronchomalacia  Furthermore, review of his chest CT is notable for significant narrowing of the left mainstem bronchus and is suggestive of tracheobronchomalacia. I will continue to medically manage this with control of his asthma and NIPPV. I've asked him to re-establish contact with his DME company to re-initiate CPAP. At this point I will defer referral to a center with IP for possible rigid bronchoscopy with silicone Y-stent placement given non-compliance with CPAP as well as continued heart failure symptoms.  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 His previous RHC was consistent with decompensated heart failure with preserved ejection fraction and he continues to follow with heart failure for this. His OSA is likely contributing to the pre-capillary portion of his pulmonary hypertension (given PVR of 2.1) but I suspect this is very mild.  -continue to follow in CHF clinic  #Tobacco Use Disorder  Unfortunately returned to smoking. Counseled regarding importance of full smoking cessation.  Return in about 6 months (around 05/04/2024).  I spent 33 minutes caring for this patient today, including preparing to see the patient, obtaining a medical history , reviewing a separately obtained history, performing a medically appropriate examination and/or evaluation, counseling and educating the patient/family/caregiver, ordering medications, tests, or procedures, documenting clinical information in the electronic health record, and independently interpreting results (not separately reported/billed) and communicating results to the patient/family/caregiver. 3 minutes spent counseling patient on smoking cessation.  Raechel Chute, MD [facility] 11/05/2023 1:16 PM    End of visit medications:  No orders of the defined types were placed in this encounter.    Current Outpatient Medications:    allopurinol (ZYLOPRIM) 300 MG tablet, TAKE ONE TABLET BY MOUTH DAILY WITH 100 MG TABLET. FOR A TOTAL OF 400 MG DAILY FOR GOUT, Disp: 90 tablet, Rfl: 1   amLODipine (NORVASC) 10 MG tablet, Take 1 tablet (10 mg total) by mouth daily., Disp: 30 tablet, Rfl: 5   aspirin EC 81 MG tablet, Take 1 tablet (81 mg total) by mouth daily. Swallow whole., Disp: 30 tablet, Rfl: 0   budesonide-formoterol (SYMBICORT) 160-4.5 MCG/ACT inhaler, Inhale 2 puffs into the lungs 2 (two) times daily., Disp: 1 each, Rfl: 12   carvedilol (COREG) 25 MG tablet, Take 1 tablet (25 mg total) by mouth 2 (  two) times daily., Disp: 180 tablet, Rfl: 2    Continuous Glucose Receiver (DEXCOM G7 RECEIVER) DEVI, Dispense one reciever to be used with 10 d devices for IDDM, Disp: 1 each, Rfl: 0   Continuous Glucose Receiver (FREESTYLE LIBRE 3 READER) DEVI, Use with libre 3 device as directed, Disp: 1 each, Rfl: 0   Continuous Glucose Sensor (FREESTYLE LIBRE 3 SENSOR) MISC, 1 Device by Does not apply route every 14 (fourteen) days., Disp: 6 each, Rfl: 2   DULoxetine (CYMBALTA) 30 MG capsule, Take 60 mg (2 capsules) poqam and 30 mg (1 capsule) poqpm daily, Disp: 270 capsule, Rfl: 1   empagliflozin (JARDIANCE) 10 MG TABS tablet, Take 1 tablet (10 mg total) by mouth daily., Disp: 90 tablet, Rfl: 1   ezetimibe (ZETIA) 10 MG tablet, TAKE ONE TABLET BY MOUTH DAILY, Disp: 90 tablet, Rfl: 3   fluticasone (FLONASE) 50 MCG/ACT nasal spray, Place 1 spray into both nostrils daily., Disp: 18.2 mL, Rfl: 6   insulin glargine (LANTUS SOLOSTAR) 100 UNIT/ML Solostar Pen, Inject 20 Units into the skin at bedtime., Disp: , Rfl:    insulin lispro (HUMALOG) 100 UNIT/ML KwikPen, Inject 8 Units into the skin 3 (three) times daily. Sliding scale up to 45 units per injection, Disp: , Rfl:    ipratropium-albuterol (DUONEB) 0.5-2.5 (3) MG/3ML SOLN, Take 3 mLs by nebulization every 6 (six) hours as needed (SOB wheeze cough)., Disp: 180 mL, Rfl: 1   levocetirizine (XYZAL) 5 MG tablet, TAKE ONE TABLET BY MOUTH EVERY EVENING, Disp: 90 tablet, Rfl: 1   lisinopril (ZESTRIL) 40 MG tablet, Take 1 tablet (40 mg total) by mouth daily., Disp: 90 tablet, Rfl: 1   metFORMIN (GLUCOPHAGE-XR) 500 MG 24 hr tablet, Take 1,000 mg by mouth 2 (two) times daily., Disp: , Rfl:    pantoprazole (PROTONIX) 40 MG tablet, Take 1 tablet (40 mg total) by mouth daily., Disp: 90 tablet, Rfl: 1   pregabalin (LYRICA) 50 MG capsule, TAKE ONE CAPSULE (50 MG TOTAL) BY MOUTH TWO TIMES DAILY., Disp: 60 capsule, Rfl: 2   rosuvastatin (CRESTOR) 40 MG tablet, TAKE ONE TABLET BY MOUTH DAILY, Disp: 90 tablet, Rfl: 3    Tiotropium Bromide Monohydrate (SPIRIVA RESPIMAT) 2.5 MCG/ACT AERS, Inhale 2 puffs into the lungs daily., Disp: 30 each, Rfl: 11   Torsemide 40 MG TABS, Take 40 mg by mouth daily., Disp: 30 tablet, Rfl: 0   TRULICITY 3 MG/0.5ML SOPN, Inject 3 mg into the skin once a week. SATURDAYS, Disp: , Rfl:    VENTOLIN HFA 108 (90 Base) MCG/ACT inhaler, INHALE TWO PUFFS INTO THE LUNGS EVERY SIX HOURS AS NEEDED FOR WHEEZING OR SHORTNESS OF BREATH, Disp: 18 each, Rfl: 2   Subjective:   PATIENT ID: Cory Rivas GENDER: male DOB: 1966-08-03, MRN: [id number]  Chief Complaint  Patient presents with   Follow-up    No cough, shortness of breath or wheezing. Patient reports his CPAP machine was taken due to poor compliance last summer.     HPI  Cory Rivas is a pleasant 58 year old male patient presenting to clinic for follow up.  Since our last visit, Mr. Hopson was admitted to the hospital with acute hypoxic respiratory failure secondary to decompensated heart failure. He underwent both LHC and RHC 05/29/2023 showing RA 7, RV 45/7, PA 45/16 (26), PCWP 18, CO/CI 5.1/2.4 overall consistent with decompensated heart failure. He was discharged to follow up with the heart failure clinic with improvement in symptoms after diuresis. He was then again admitted  to the hospital earlier this month 10/2023 for respiratory failure felt to be secondary to decompensated heart failure. He was aggressively diuresed and felt significantly better after being diuresed. He was discharged on 10/24/2023 and underwent outpatient repeat RHC on 10/30/2023 showing RA 5, RV 42/6, PA 39/14 (22), and PCWP 11, CO/CI 5.2/2.5.  His symptoms are overall improved compared to prior, with decreased shortness of breath. He was not compliant with his CPAP machine and was reacquisitioned by the DME provider. He is reporting compliance with his inhaler therapy, but has relapsed with smoking cigarettes.  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 He had to undergo cervical fusion at [facility] after which he has been on disability.  Ancillary information including prior medications, full medical/surgical/family/social histories, and PFTs (when available) are listed below and have been reviewed.   Review of Systems  Constitutional:  Negative for weight loss.  Respiratory:  Positive for shortness of breath. Negative for cough, hemoptysis, sputum production and wheezing.   Cardiovascular:  Negative for chest pain.     Objective:   Vitals:   11/05/23 0924  BP: 132/88  Pulse: 60  Temp: 97.6 F (36.4 C)  TempSrc: Temporal  SpO2: 96%  Weight: 220 lb (99.8 kg)  Height: 5\' 6"  (1.676 m)   96% on RA BMI Readings from Last 3 Encounters:  11/05/23 35.51 kg/m  11/01/23 35.96 kg/m  10/30/23 35.23 kg/m   Wt Readings from Last 3 Encounters:  11/05/23 220 lb (99.8 kg)  11/01/23 222 lb 12.8 oz (101.1 kg)  10/30/23 218 lb 4.8 oz (99 kg)    Physical Exam Constitutional:      Appearance: He is obese. He is not ill-appearing.  HENT:     Nose: No congestion or rhinorrhea.     Mouth/Throat:     Mouth: Mucous membranes are moist.  Eyes:     Pupils: Pupils are equal, round, and reactive to light.  Cardiovascular:     Rate and Rhythm: Normal rate and regular rhythm.     Heart sounds: Normal heart sounds.  Pulmonary:     Effort: Pulmonary effort is normal.     Breath sounds: Normal breath sounds. No wheezing, rhonchi or rales.  Abdominal:     General: There is distension.     Palpations: Abdomen is soft.  Musculoskeletal:        General: Normal range of motion.  Neurological:     General: No focal deficit present.     Mental Status: He is alert and oriented to person, place, and time. Mental status is at baseline.       Ancillary Information    Past Medical History:  Diagnosis Date   Bulging of cervical intervertebral disc    CHF (congestive heart failure) (HCC)    Diabetes mellitus without  complication (HCC)    Gout    High cholesterol    Hypertension    Stress due to family tension 04/15/2019     Family History  Problem Relation Age of Onset   Heart disease Mother    Diabetes Mother    Hyperlipidemia Mother    Hypertension Mother    Heart attack Mother    Lung cancer Father    Hypertension Father    Stroke Father    AAA (abdominal aortic aneurysm) Maternal Grandmother    Heart attack Maternal Grandmother    Diabetes Maternal Grandfather      Past Surgical History:  Procedure Laterality Date  BACK SURGERY     CARDIAC CATHETERIZATION     CHOLECYSTECTOMY     COLONOSCOPY WITH PROPOFOL N/A 03/10/2020   Procedure: COLONOSCOPY WITH PROPOFOL;  Surgeon: Midge Minium, MD;  Location[facility] ENDOSCOPY;  Service: Endoscopy;  Laterality: N/A;   COLONOSCOPY WITH PROPOFOL N/A 09/25/2023   Procedure: COLONOSCOPY WITH PROPOFOL;  Surgeon: Midge Minium, MD;  Location: [facility] ENDOSCOPY;  Service: Endoscopy;  Laterality: N/A;   CORONARY PRESSURE/FFR STUDY N/A 05/29/2023   Procedure: CORONARY PRESSURE/FFR STUDY;  Surgeon: Yvonne Kendall, MD;  Location: [facility] INVASIVE CV LAB;  Service: Cardiovascular;  Laterality: N/A;   POLYPECTOMY  09/25/2023   Procedure: POLYPECTOMY;  Surgeon: Midge Minium, MD;  Location: [facility] ENDOSCOPY;  Service: Endoscopy;;   RIGHT/LEFT HEART CATH AND CORONARY ANGIOGRAPHY N/A 05/29/2023   Procedure: RIGHT/LEFT HEART CATH AND CORONARY ANGIOGRAPHY;  Surgeon: Yvonne Kendall, MD;  Location: [facility] INVASIVE CV LAB;  Service: Cardiovascular;  Laterality: N/A;   RIGHT/LEFT HEART CATH AND CORONARY ANGIOGRAPHY Bilateral 10/30/2023   Procedure: RIGHT/LEFT HEART CATH AND CORONARY ANGIOGRAPHY;  Surgeon: Yvonne Kendall, MD;  Location: [facility] INVASIVE CV LAB;  Service: Cardiovascular;  Laterality: Bilateral;    Social History   Socioeconomic History   Marital status: Divorced    Spouse name: Not on file   Number of children: 0   Years of education: 9   Highest education  level: 9th grade  Occupational History   Occupation: disability  Tobacco Use   Smoking status: Some Days    Current packs/day: 0.00    Average packs/day: 1 pack/day for 37.0 years (37.0 ttl pk-yrs)    Types: Cigarettes    Start date: 10/19/1989    Last attempt to quit: 08/20/2022    Years since quitting: 1.2   Smokeless tobacco: Never   Tobacco comments:    30+ years hx smoking 2 ppd, stopped smoking briefly in April 2022 for a surgery and then restarted smoking about 1/2 ppd last 4-5 months         5-6cigs daily- 04/10/2023  Vaping Use   Vaping status: Never Used  Substance and Sexual Activity   Alcohol use: No   Drug use: No   Sexual activity: Not Currently    Partners: Female  Other Topics Concern   Not on file  Social History Narrative   Lives with sister [name], brother in law [name]), and his mother - Deavin Posen   Social Drivers of Health   Financial Resource Strain: Medium Risk (12/06/2022)   Overall Financial Resource Strain (CARDIA)    Difficulty of Paying Living Expenses: Somewhat hard  Food Insecurity: No Food Insecurity (10/23/2023)   Hunger Vital Sign    Worried About Running Out of Food in the Last Year: Never true    Ran Out of Food in the Last Year: Never true  Transportation Needs: Patient Declined (10/23/2023)   PRAPARE - Transportation    Lack of Transportation (Medical): Patient declined    Lack of Transportation (Non-Medical): Patient declined  Physical Activity: Insufficiently Active (12/06/2022)   Exercise Vital Sign    Days of Exercise per Week: 3 days    Minutes of Exercise per Session: 10 min  Stress: No Stress Concern Present (12/06/2022)   [employer] of Occupational Health - Occupational Stress Questionnaire    Feeling of Stress : Only a little  Social Connections: Socially Isolated (12/06/2022)   Social Connection and Isolation Panel [NHANES]    Frequency of Communication with Friends and Family: More than three times a week  Frequency of  Social Gatherings with Friends and Family: Twice a week    Attends Religious Services: Never    Database administrator or Organizations: No    Attends Banker Meetings: Never    Marital Status: Divorced  Catering manager Violence: Not At Risk (10/23/2023)   Humiliation, Afraid, Rape, and Kick questionnaire    Fear of Current or Ex-Partner: No    Emotionally Abused: No    Physically Abused: No    Sexually Abused: No     Allergies  Allergen Reactions   Oxycodone Nausea And Vomiting   Entresto [Sacubitril-Valsartan] Nausea Only and Other (See Comments)    Lightheaded and dizzy    Onion Nausea And Vomiting   Nitroglycerin Itching and Rash     CBC    Component Value Date/Time   WBC 14.8 (H) 10/28/2023 1139   WBC 14.1 (H) 10/24/2023 0550   RBC 5.93 (H) 10/28/2023 1139   RBC 5.24 10/24/2023 0550   HGB 14.6 10/30/2023 1435   HGB 17.2 10/28/2023 1139   HCT 43.0 10/30/2023 1435   HCT 51.6 (H) 10/28/2023 1139   PLT 198 10/28/2023 1139   MCV 87 10/28/2023 1139   MCH 29.0 10/28/2023 1139   MCH 29.4 10/24/2023 0550   MCHC 33.3 10/28/2023 1139   MCHC 34.2 10/24/2023 0550   RDW 13.3 10/28/2023 1139   LYMPHSABS 1.8 03/25/2023 1243   LYMPHSABS 2.1 08/01/2022 1057   MONOABS 0.5 03/25/2023 1243   EOSABS 158 09/10/2023 1040   EOSABS 0.1 08/01/2022 1057   BASOSABS 42 09/10/2023 1040   BASOSABS 0.0 08/01/2022 1057    Pulmonary Functions Testing Results:    Latest Ref Rng & Units 10/05/2022    3:54 PM  PFT Results  FVC-Pre L 2.54   FVC-Predicted Pre % 57   FVC-Post L 3.01   FVC-Predicted Post % 67   Pre FEV1/FVC % % 76   Post FEV1/FCV % % 71   FEV1-Pre L 1.93   FEV1-Predicted Pre % 56   FEV1-Post L 2.15   DLCO uncorrected ml/min/mmHg 21.47   DLCO UNC% % 82   DLVA Predicted % 98   TLC L 5.07   TLC % Predicted % 79   RV % Predicted % 108     Outpatient Medications Prior to Visit  Medication Sig Dispense Refill   allopurinol (ZYLOPRIM) 300 MG tablet TAKE ONE  TABLET BY MOUTH DAILY WITH 100 MG TABLET. FOR A TOTAL OF 400 MG DAILY FOR GOUT 90 tablet 1   amLODipine (NORVASC) 10 MG tablet Take 1 tablet (10 mg total) by mouth daily. 30 tablet 5   aspirin EC 81 MG tablet Take 1 tablet (81 mg total) by mouth daily. Swallow whole. 30 tablet 0   budesonide-formoterol (SYMBICORT) 160-4.5 MCG/ACT inhaler Inhale 2 puffs into the lungs 2 (two) times daily. 1 each 12   carvedilol (COREG) 25 MG tablet Take 1 tablet (25 mg total) by mouth 2 (two) times daily. 180 tablet 2   Continuous Glucose Receiver (DEXCOM G7 RECEIVER) DEVI Dispense one reciever to be used with 10 d devices for IDDM 1 each 0   Continuous Glucose Receiver (FREESTYLE LIBRE 3 READER) DEVI Use with libre 3 device as directed 1 each 0   Continuous Glucose Sensor (FREESTYLE LIBRE 3 SENSOR) MISC 1 Device by Does not apply route every 14 (fourteen) days. 6 each 2   DULoxetine (CYMBALTA) 30 MG capsule Take 60 mg (2 capsules) poqam and 30 mg (1  capsule) poqpm daily 270 capsule 1   empagliflozin (JARDIANCE) 10 MG TABS tablet Take 1 tablet (10 mg total) by mouth daily. 90 tablet 1   ezetimibe (ZETIA) 10 MG tablet TAKE ONE TABLET BY MOUTH DAILY 90 tablet 3   fluticasone (FLONASE) 50 MCG/ACT nasal spray Place 1 spray into both nostrils daily. 18.2 mL 6   insulin glargine (LANTUS SOLOSTAR) 100 UNIT/ML Solostar Pen Inject 20 Units into the skin at bedtime.     insulin lispro (HUMALOG) 100 UNIT/ML KwikPen Inject 8 Units into the skin 3 (three) times daily. Sliding scale up to 45 units per injection     ipratropium-albuterol (DUONEB) 0.5-2.5 (3) MG/3ML SOLN Take 3 mLs by nebulization every 6 (six) hours as needed (SOB wheeze cough). 180 mL 1   levocetirizine (XYZAL) 5 MG tablet TAKE ONE TABLET BY MOUTH EVERY EVENING 90 tablet 1   lisinopril (ZESTRIL) 40 MG tablet Take 1 tablet (40 mg total) by mouth daily. 90 tablet 1   metFORMIN (GLUCOPHAGE-XR) 500 MG 24 hr tablet Take 1,000 mg by mouth 2 (two) times daily.      pantoprazole (PROTONIX) 40 MG tablet Take 1 tablet (40 mg total) by mouth daily. 90 tablet 1   pregabalin (LYRICA) 50 MG capsule TAKE ONE CAPSULE (50 MG TOTAL) BY MOUTH TWO TIMES DAILY. 60 capsule 2   rosuvastatin (CRESTOR) 40 MG tablet TAKE ONE TABLET BY MOUTH DAILY 90 tablet 3   Tiotropium Bromide Monohydrate (SPIRIVA RESPIMAT) 2.5 MCG/ACT AERS Inhale 2 puffs into the lungs daily. 30 each 11   Torsemide 40 MG TABS Take 40 mg by mouth daily. 30 tablet 0   TRULICITY 3 MG/0.5ML SOPN Inject 3 mg into the skin once a week. SATURDAYS     VENTOLIN HFA 108 (90 Base) MCG/ACT inhaler INHALE TWO PUFFS INTO THE LUNGS EVERY SIX HOURS AS NEEDED FOR WHEEZING OR SHORTNESS OF BREATH 18 each 2   No facility-administered medications prior to visit.

## 2023-11-11 ENCOUNTER — Ambulatory Visit: Payer: Medicaid Other | Admitting: Family Medicine

## 2023-11-11 ENCOUNTER — Encounter: Payer: Self-pay | Admitting: Family Medicine

## 2023-11-11 VITALS — BP 118/70 | HR 80 | Resp 16 | Ht 66.0 in | Wt 221.0 lb

## 2023-11-11 DIAGNOSIS — G959 Disease of spinal cord, unspecified: Secondary | ICD-10-CM

## 2023-11-11 DIAGNOSIS — N179 Acute kidney failure, unspecified: Secondary | ICD-10-CM

## 2023-11-11 DIAGNOSIS — E785 Hyperlipidemia, unspecified: Secondary | ICD-10-CM | POA: Diagnosis not present

## 2023-11-11 DIAGNOSIS — Z794 Long term (current) use of insulin: Secondary | ICD-10-CM | POA: Diagnosis not present

## 2023-11-11 DIAGNOSIS — J4489 Other specified chronic obstructive pulmonary disease: Secondary | ICD-10-CM

## 2023-11-11 DIAGNOSIS — E1165 Type 2 diabetes mellitus with hyperglycemia: Secondary | ICD-10-CM

## 2023-11-11 DIAGNOSIS — E1169 Type 2 diabetes mellitus with other specified complication: Secondary | ICD-10-CM

## 2023-11-11 DIAGNOSIS — M1A09X0 Idiopathic chronic gout, multiple sites, without tophus (tophi): Secondary | ICD-10-CM

## 2023-11-11 DIAGNOSIS — I10 Essential (primary) hypertension: Secondary | ICD-10-CM

## 2023-11-11 MED ORDER — ALLOPURINOL 300 MG PO TABS
300.0000 mg | ORAL_TABLET | Freq: Every day | ORAL | 1 refills | Status: DC
Start: 2023-11-11 — End: 2024-04-03
  Filled 2024-01-29 – 2024-02-18 (×3): qty 90, 90d supply, fill #0
  Filled 2024-02-18: qty 30, 30d supply, fill #0
  Filled 2024-03-04 – 2024-03-17 (×5): qty 30, 30d supply, fill #1

## 2023-11-11 MED ORDER — PREGABALIN 50 MG PO CAPS: 50.0000 mg | ORAL_CAPSULE | Freq: Two times a day (BID) | ORAL | 0 refills | Status: DC

## 2023-11-11 MED ORDER — ASPIRIN 81 MG PO TBEC
81.0000 mg | DELAYED_RELEASE_TABLET | Freq: Every day | ORAL | 3 refills | Status: DC
  Filled 2024-01-31 – 2024-02-18 (×2): qty 90, 90d supply, fill #0
  Filled 2024-02-18: qty 30, 30d supply, fill #0
  Filled 2024-03-04 – 2024-03-17 (×5): qty 30, 30d supply, fill #1
  Filled 2024-04-09: qty 30, 30d supply, fill #2
  Filled 2024-05-29 – 2024-06-01 (×2): qty 30, 30d supply, fill #3
  Filled 2024-07-09 – 2024-07-14 (×2): qty 30, 30d supply, fill #4
  Filled 2024-08-25: qty 30, 30d supply, fill #5
  Filled 2024-09-25: qty 30, 30d supply, fill #6
  Filled 2024-11-09: qty 30, 30d supply, fill #7

## 2023-11-11 MED ORDER — DULOXETINE HCL 30 MG PO CPEP: ORAL_CAPSULE | ORAL | 1 refills | Status: DC

## 2023-11-11 MED ORDER — EZETIMIBE 10 MG PO TABS: 10.0000 mg | ORAL_TABLET | Freq: Every day | ORAL | 1 refills | Status: DC

## 2023-11-11 MED ORDER — EMPAGLIFLOZIN 10 MG PO TABS
10.0000 mg | ORAL_TABLET | Freq: Every day | ORAL | 1 refills | Status: DC
  Filled 2024-01-31 – 2024-02-18 (×2): qty 90, 90d supply, fill #0
  Filled 2024-02-18: qty 30, 30d supply, fill #0
  Filled 2024-03-04 – 2024-03-17 (×5): qty 30, 30d supply, fill #1
  Filled 2024-04-09: qty 30, 30d supply, fill #2

## 2023-11-11 MED ORDER — ROSUVASTATIN CALCIUM 40 MG PO TABS: 40.0000 mg | ORAL_TABLET | Freq: Every day | ORAL | 1 refills | Status: DC

## 2023-11-11 NOTE — Assessment & Plan Note (Signed)
Meds refilled.

## 2023-11-11 NOTE — Assessment & Plan Note (Signed)
Will check labs at next appt Pt encouraged to remain on zetia and crestor and continue healthy diet/lifestyle efforts

## 2023-11-11 NOTE — Patient Instructions (Addendum)
Check with pulmonary medicine about what the CPAP    Schedule with endocrinology for your follow up ASAP

## 2023-11-11 NOTE — Progress Notes (Signed)
Name: Cory Rivas   MRN: [id number]    DOB: 1965/11/20   Date:11/11/2023       Progress Note  Chief Complaint  Patient presents with   Medical Management of Chronic Issues    3 months     Subjective:   Cory Rivas is a 58 y.o. male, presents to clinic for routine f/up  Hypertension:  Currently managed on coreg, lisinopril amlodipine demedex  Pt reports good med compliance and denies any SE.   Blood pressure today is well controlled. BP Readings from Last 3 Encounters:  11/11/23 118/70  11/05/23 132/88  11/01/23 136/80  Pt denies CP, SOB, exertional sx, LE edema, palpitation, Ha's, visual disturbances, lightheadedness, hypotension, syncope.  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 LDL cholesterol not calculated. Triglyceride levels greater than 400 mg/dL invalidate calculated LDL results. . Reference range: <100 . Desirable range <100 mg/dL for primary prevention;   <70 mg/dL for patients with CHD or diabetic patients  with > or = 2 CHD risk factors. Marland Kitchen LDL-C is now  calculated using the Martin-Hopkins  calculation, which is a validated novel method providing  better accuracy than the Friedewald equation in the  estimation of LDL-C.  Horald Pollen et al. Lenox Ahr. 4259;563(87): 2061-2068  (http://education.[facility].com/faq/FAQ164)    LDLDIRECT 147 (H) 08/19/2023   TRIG 404 (H) 08/09/2023   CHOLHDL 6.3 (H) 08/09/2023   - Denies: Chest pain, shortness of breath, myalgias, claudication      Current Outpatient Medications:    allopurinol (ZYLOPRIM) 300 MG tablet, TAKE ONE TABLET BY MOUTH DAILY WITH 100 MG TABLET. FOR A TOTAL OF 400 MG DAILY FOR GOUT, Disp: 90 tablet, Rfl: 1   amLODipine (NORVASC) 10 MG tablet, Take 1 tablet (10 mg total) by mouth daily., Disp: 30 tablet, Rfl: 5   aspirin EC 81 MG tablet, Take 1 tablet (81 mg total) by mouth daily. Swallow whole., Disp: 30 tablet, Rfl: 0   budesonide-formoterol (SYMBICORT) 160-4.5 MCG/ACT inhaler, Inhale 2 puffs into the lungs 2 (two) times daily., Disp: 1 each, Rfl: 12   Continuous Glucose Receiver (DEXCOM G7 RECEIVER) DEVI, Dispense one reciever to be used with 10 d devices for IDDM, Disp: 1 each, Rfl: 0   Continuous Glucose Receiver (FREESTYLE LIBRE 3 READER) DEVI, Use with libre 3 device as directed, Disp: 1 each, Rfl: 0   Continuous Glucose Sensor (FREESTYLE LIBRE 3 SENSOR) MISC, 1 Device by Does not apply  route every 14 (fourteen) days., Disp: 6 each, Rfl: 2   DULoxetine (CYMBALTA) 30 MG capsule, Take 60 mg (2 capsules) poqam and 30 mg (1 capsule) poqpm daily, Disp: 270 capsule, Rfl: 1   empagliflozin (JARDIANCE) 10 MG TABS tablet, Take 1 tablet (10 mg total) by mouth daily., Disp: 90 tablet, Rfl: 1   ezetimibe (ZETIA) 10 MG tablet, TAKE ONE TABLET BY MOUTH DAILY, Disp: 90 tablet, Rfl: 3   fluticasone (FLONASE) 50 MCG/ACT nasal spray, Place 1 spray into both nostrils daily., Disp: 18.2 mL, Rfl: 6   insulin glargine (LANTUS SOLOSTAR) 100 UNIT/ML Solostar Pen, Inject 20 Units into the skin at bedtime.,  Disp: , Rfl:    insulin lispro (HUMALOG) 100 UNIT/ML KwikPen, Inject 8 Units into the skin 3 (three) times daily. Sliding scale up to 45 units per injection, Disp: , Rfl:    ipratropium-albuterol (DUONEB) 0.5-2.5 (3) MG/3ML SOLN, Take 3 mLs by nebulization every 6 (six) hours as needed (SOB wheeze cough)., Disp: 180 mL, Rfl: 1   levocetirizine (XYZAL) 5 MG tablet, TAKE ONE TABLET BY MOUTH EVERY EVENING, Disp: 90 tablet, Rfl: 1   lisinopril (ZESTRIL) 40 MG tablet, Take 1 tablet (40 mg total) by mouth daily., Disp: 90 tablet, Rfl: 1   metFORMIN (GLUCOPHAGE-XR) 500 MG 24 hr tablet, Take 1,000 mg by mouth 2 (two) times daily., Disp: , Rfl:    pantoprazole (PROTONIX) 40 MG tablet, Take 1 tablet (40 mg total) by mouth daily., Disp: 90 tablet, Rfl: 1   pregabalin (LYRICA) 50 MG capsule, TAKE ONE CAPSULE (50 MG TOTAL) BY MOUTH TWO TIMES DAILY., Disp: 60 capsule, Rfl: 2   rosuvastatin (CRESTOR) 40 MG tablet, TAKE ONE TABLET BY MOUTH DAILY, Disp: 90 tablet, Rfl: 3   Tiotropium Bromide Monohydrate (SPIRIVA RESPIMAT) 2.5 MCG/ACT AERS, Inhale 2 puffs into the lungs daily., Disp: 30 each, Rfl: 11   Torsemide 40 MG TABS, Take 40 mg by mouth daily., Disp: 30 tablet, Rfl: 0   TRULICITY 3 MG/0.5ML SOPN, Inject 3 mg into the skin once a week. SATURDAYS, Disp: , Rfl:    VENTOLIN HFA 108 (90 Base) MCG/ACT inhaler, INHALE TWO PUFFS INTO THE LUNGS EVERY SIX HOURS AS NEEDED FOR WHEEZING OR SHORTNESS OF BREATH, Disp: 18 each, Rfl: 2   carvedilol (COREG) 25 MG tablet, Take 1 tablet (25 mg total) by mouth 2 (two) times daily., Disp: 180 tablet, Rfl: 2  Patient Active Problem List   Diagnosis Date Noted   COPD with acute exacerbation (HCC) 10/21/2023   History of colon polyps 09/25/2023   Myocardial injury 08/19/2023   Type II diabetes mellitus with renal manifestations (HCC) 08/19/2023   Obesity (BMI 30-39.9) 08/19/2023   HLD (hyperlipidemia) 08/19/2023   Chronic diastolic CHF (congestive heart failure) (HCC) 08/19/2023    Diarrhea 08/19/2023   CAD (coronary artery disease) 08/19/2023   CKD (chronic kidney disease) stage 2, GFR 60-89 ml/min 08/19/2023   Acute on chronic diastolic heart failure (HCC) 05/30/2023   Unstable angina (HCC) 05/29/2023   Chest pain 05/27/2023   Type 2 diabetes mellitus with peripheral neuropathy (HCC) 05/27/2023   Gastroesophageal reflux disease without esophagitis 05/27/2023   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   Dyslipidemia 05/27/2023   Acute decompensated heart failure (HCC) 11/23/2022   Hypertensive emergency 11/22/2022   Acute respiratory failure with hypoxia (HCC) 11/22/2022   Elevated troponin 11/22/2022   Chronic heart failure with preserved ejection fraction (HCC) 08/24/2022   Acute on chronic diastolic congestive heart failure (HCC) 08/23/2022  Class 2 obesity 08/23/2022   Type 2 diabetes mellitus with hyperglycemia (HCC) 08/23/2022   Grade I diastolic dysfunction 06/08/2022   LVH (left ventricular hypertrophy) 06/08/2022   Accelerated hypertension 06/08/2022   S/P cervical spinal fusion 08/18/2021   Former heavy tobacco smoker 08/18/2021   Gout 08/18/2021   Other chronic pain 10/26/2020   Systolic murmur 10/26/2020   Tobacco use 10/26/2020   Polyp of ascending colon    Rectal polyp    Class 2 severe obesity with serious comorbidity and body mass index (BMI) of 39.0 to 39.9 in adult [facility]) 04/15/2019   Chronic kidney disease (CKD) stage G2/A3, mildly decreased glomerular filtration rate (GFR) between 60-89 mL/min/1.73 square meter and albuminuria creatinine ratio greater than 300 mg/g 04/15/2019   Proteinuria 04/15/2019   Hyperlipidemia associated with type 2 diabetes mellitus (HCC) 03/18/2019   Diabetes mellitus (HCC) 03/18/2019   Idiopathic chronic gout of multiple sites without tophus 03/18/2019   Essential hypertension 03/18/2019   Dermatitis 03/18/2019   Lumbar degenerative disc disease 11/22/2015   Cervical myelopathy (HCC) 11/22/2015   Osteoarthritis  of spine with radiculopathy, lumbar region 11/22/2015   Degenerative disc disease, cervical 11/22/2015   Osteoarthritis of spine with radiculopathy, cervical region 11/22/2015    Past Surgical History:  Procedure Laterality Date   BACK SURGERY     CARDIAC CATHETERIZATION     CHOLECYSTECTOMY     COLONOSCOPY WITH PROPOFOL N/A 03/10/2020   Procedure: COLONOSCOPY WITH PROPOFOL;  Surgeon: Midge Minium, MD;  Location[facility] ENDOSCOPY;  Service: Endoscopy;  Laterality: N/A;   COLONOSCOPY WITH PROPOFOL N/A 09/25/2023   Procedure: COLONOSCOPY WITH PROPOFOL;  Surgeon: Midge Minium, MD;  Location[facility] ENDOSCOPY;  Service: Endoscopy;  Laterality: N/A;   CORONARY PRESSURE/FFR STUDY N/A 05/29/2023   Procedure: CORONARY PRESSURE/FFR STUDY;  Surgeon: Yvonne Kendall, MD;  Location: [facility] INVASIVE CV LAB;  Service: Cardiovascular;  Laterality: N/A;   POLYPECTOMY  09/25/2023   Procedure: POLYPECTOMY;  Surgeon: Midge Minium, MD;  Location: [facility] ENDOSCOPY;  Service: Endoscopy;;   RIGHT/LEFT HEART CATH AND CORONARY ANGIOGRAPHY N/A 05/29/2023   Procedure: RIGHT/LEFT HEART CATH AND CORONARY ANGIOGRAPHY;  Surgeon: Yvonne Kendall, MD;  Location: [facility] INVASIVE CV LAB;  Service: Cardiovascular;  Laterality: N/A;   RIGHT/LEFT HEART CATH AND CORONARY ANGIOGRAPHY Bilateral 10/30/2023   Procedure: RIGHT/LEFT HEART CATH AND CORONARY ANGIOGRAPHY;  Surgeon: Yvonne Kendall, MD;  Location: [facility] INVASIVE CV LAB;  Service: Cardiovascular;  Laterality: Bilateral;    Family History  Problem Relation Age of Onset   Heart disease Mother    Diabetes Mother    Hyperlipidemia Mother    Hypertension Mother    Heart attack Mother    Lung cancer Father    Hypertension Father    Stroke Father    AAA (abdominal aortic aneurysm) Maternal Grandmother    Heart attack Maternal Grandmother    Diabetes Maternal Grandfather     Social History   Tobacco Use   Smoking status: Some Days    Current packs/day: 0.00    Average  packs/day: 1 pack/day for 37.0 years (37.0 ttl pk-yrs)    Types: Cigarettes    Start date: 10/19/1989    Last attempt to quit: 08/20/2022    Years since quitting: 1.2   Smokeless tobacco: Never   Tobacco comments:    30+ years hx smoking 2 ppd, stopped smoking briefly in April 2022 for a surgery and then restarted smoking about 1/2 ppd last 4-5 months         5-6cigs  daily- 04/10/2023  Vaping Use   Vaping status: Never Used  Substance Use Topics   Alcohol use: No   Drug use: No     Allergies  Allergen Reactions   Oxycodone Nausea And Vomiting   Entresto [Sacubitril-Valsartan] Nausea Only and Other (See Comments)    Lightheaded and dizzy    Onion Nausea And Vomiting   Nitroglycerin Itching and Rash    Health Maintenance  Topic Date Due   COVID-19 Vaccine (4 - 2024-25 season) 11/17/2023 (Originally 06/16/2023)   HEMOGLOBIN A1C  02/07/2024   OPHTHALMOLOGY EXAM  03/04/2024   Lung Cancer Screening  03/08/2024   Diabetic kidney evaluation - Urine ACR  03/20/2024   FOOT EXAM  08/27/2024   Diabetic kidney evaluation - eGFR measurement  10/31/2024   Colonoscopy  09/24/2028   DTaP/Tdap/Td (2 - Td or Tdap) 04/14/2029   Pneumococcal Vaccine 81-59 Years old  Completed   INFLUENZA VACCINE  Completed   Hepatitis C Screening  Completed   HIV Screening  Completed   Zoster Vaccines- Shingrix  Completed   HPV VACCINES  Aged Out    Chart Review Today: I personally reviewed active problem list, medication list, allergies, family history, social history, health maintenance, notes from last encounter, lab results, imaging with the patient/caregiver today.   Review of Systems  Constitutional: Negative.   HENT: Negative.    Eyes: Negative.   Respiratory: Negative.    Cardiovascular: Negative.   Gastrointestinal: Negative.   Endocrine: Negative.   Genitourinary: Negative.   Musculoskeletal: Negative.   Skin: Negative.   Allergic/Immunologic: Negative.   Neurological: Negative.    Hematological: Negative.   Psychiatric/Behavioral: Negative.    All other systems reviewed and are negative.    Objective:   Vitals:   11/11/23 1020  BP: 118/70  Pulse: 80  Resp: 16  SpO2: 99%  Weight: 221 lb (100.2 kg)  Height: 5\' 6"  (1.676 m)    Body mass index is 35.67 kg/m.  Physical Exam Vitals and nursing note reviewed.  Constitutional:      General: He is not in acute distress.    Appearance: Normal appearance. He is well-developed. He is obese. He is not ill-appearing, toxic-appearing or diaphoretic.  HENT:     Head: Normocephalic and atraumatic.     Nose: Nose normal.  Eyes:     General:        Right eye: No discharge.        Left eye: No discharge.     Conjunctiva/sclera: Conjunctivae normal.  Neck:     Trachea: No tracheal deviation.  Cardiovascular:     Rate and Rhythm: Normal rate and regular rhythm.     Pulses: Normal pulses.     Heart sounds: Normal heart sounds.  Pulmonary:     Effort: Pulmonary effort is normal. No respiratory distress.     Breath sounds: Normal breath sounds. No stridor.  Skin:    General: Skin is warm and dry.     Findings: No rash.  Neurological:     Mental Status: He is alert.     Motor: No abnormal muscle tone.     Coordination: Coordination normal.  Psychiatric:        Behavior: Behavior normal.         Assessment & Plan:   Problem List Items Addressed This Visit     Cervical myelopathy (HCC)   Meds refilled      Relevant Medications   DULoxetine (CYMBALTA) 30 MG capsule  pregabalin (LYRICA) 50 MG capsule   Hyperlipidemia associated with type 2 diabetes mellitus (HCC)   Will check labs at next appt Pt encouraged to remain on zetia and crestor and continue healthy diet/lifestyle efforts      Relevant Medications   empagliflozin (JARDIANCE) 10 MG TABS tablet   ezetimibe (ZETIA) 10 MG tablet   rosuvastatin (CRESTOR) 40 MG tablet   aspirin EC 81 MG tablet   Idiopathic chronic gout of multiple sites  without tophus   Managed on 400 mg allopurinol daily No recent flares      Relevant Medications   allopurinol (ZYLOPRIM) 300 MG tablet   Essential hypertension   BP at goal and well controlled today on lisinopril 40, coreg BID, amlodipine 10 BP Readings from Last 3 Encounters:  11/11/23 118/70  11/05/23 132/88  11/01/23 136/80         Relevant Medications   ezetimibe (ZETIA) 10 MG tablet   rosuvastatin (CRESTOR) 40 MG tablet   aspirin EC 81 MG tablet   Uncontrolled diabetes mellitus with hyperglycemia, with long-term current use of insulin (HCC)   Lab Results  Component Value Date   HGBA1C 9.8 (H) 08/09/2023  Pt missed his f/up with endocrinology He is working on diet and did get freestyle libre 3 Encouraged to get f/up appt rescheduled       Relevant Medications   empagliflozin (JARDIANCE) 10 MG TABS tablet   ezetimibe (ZETIA) 10 MG tablet   rosuvastatin (CRESTOR) 40 MG tablet   aspirin EC 81 MG tablet   COPD with asthma (HCC)   Sx well controlled with current enhalers, no recent exacerbations      Other Visit Diagnoses       AKI (acute kidney injury) (HCC)    -  Primary   improving back to baseline        Return in about 3 months (around 02/09/2024) for Routine follow-up.   Danelle Berry, PA-C 11/11/23 10:34 AM

## 2023-11-11 NOTE — Assessment & Plan Note (Signed)
Managed on 400 mg allopurinol daily No recent flares

## 2023-11-11 NOTE — Assessment & Plan Note (Signed)
Sx well controlled with current enhalers, no recent exacerbations

## 2023-11-11 NOTE — Assessment & Plan Note (Signed)
Lab Results  Component Value Date   HGBA1C 9.8 (H) 08/09/2023  Pt missed his f/up with endocrinology He is working on diet and did get freestyle libre 3 Encouraged to get f/up appt rescheduled

## 2023-11-11 NOTE — Assessment & Plan Note (Signed)
BP at goal and well controlled today on lisinopril 40, coreg BID, amlodipine 10 BP Readings from Last 3 Encounters:  11/11/23 118/70  11/05/23 132/88  11/01/23 136/80

## 2023-11-12 ENCOUNTER — Ambulatory Visit: Payer: Medicaid Other | Attending: Physician Assistant

## 2023-11-12 DIAGNOSIS — R0609 Other forms of dyspnea: Secondary | ICD-10-CM | POA: Diagnosis not present

## 2023-11-12 DIAGNOSIS — R7989 Other specified abnormal findings of blood chemistry: Secondary | ICD-10-CM | POA: Diagnosis not present

## 2023-11-12 DIAGNOSIS — I25118 Atherosclerotic heart disease of native coronary artery with other forms of angina pectoris: Secondary | ICD-10-CM

## 2023-11-12 DIAGNOSIS — I2089 Other forms of angina pectoris: Secondary | ICD-10-CM

## 2023-11-12 LAB — ECHOCARDIOGRAM COMPLETE
AV Mean grad: 10 mm[Hg]
AV Peak grad: 19.4 mm[Hg]
Ao pk vel: 2.2 m/s
Area-P 1/2: 3.48 cm2
S' Lateral: 3.2 cm

## 2023-11-15 ENCOUNTER — Encounter: Payer: Self-pay | Admitting: Physician Assistant

## 2023-11-15 ENCOUNTER — Ambulatory Visit: Payer: Medicaid Other | Attending: Physician Assistant | Admitting: Physician Assistant

## 2023-11-15 VITALS — BP 156/95 | HR 66 | Ht 66.0 in | Wt 223.4 lb

## 2023-11-15 DIAGNOSIS — Z79899 Other long term (current) drug therapy: Secondary | ICD-10-CM | POA: Diagnosis not present

## 2023-11-15 DIAGNOSIS — F17200 Nicotine dependence, unspecified, uncomplicated: Secondary | ICD-10-CM

## 2023-11-15 DIAGNOSIS — I25118 Atherosclerotic heart disease of native coronary artery with other forms of angina pectoris: Secondary | ICD-10-CM | POA: Diagnosis not present

## 2023-11-15 DIAGNOSIS — E66812 Obesity, class 2: Secondary | ICD-10-CM

## 2023-11-15 DIAGNOSIS — I272 Pulmonary hypertension, unspecified: Secondary | ICD-10-CM

## 2023-11-15 DIAGNOSIS — Z6835 Body mass index (BMI) 35.0-35.9, adult: Secondary | ICD-10-CM

## 2023-11-15 DIAGNOSIS — I10 Essential (primary) hypertension: Secondary | ICD-10-CM | POA: Diagnosis not present

## 2023-11-15 DIAGNOSIS — N1831 Chronic kidney disease, stage 3a: Secondary | ICD-10-CM | POA: Diagnosis not present

## 2023-11-15 DIAGNOSIS — I5032 Chronic diastolic (congestive) heart failure: Secondary | ICD-10-CM | POA: Diagnosis not present

## 2023-11-15 DIAGNOSIS — G4733 Obstructive sleep apnea (adult) (pediatric): Secondary | ICD-10-CM

## 2023-11-15 DIAGNOSIS — E785 Hyperlipidemia, unspecified: Secondary | ICD-10-CM

## 2023-11-15 MED ORDER — CARVEDILOL 25 MG PO TABS: 37.5000 mg | ORAL_TABLET | Freq: Two times a day (BID) | ORAL | 2 refills | Status: DC

## 2023-11-15 NOTE — Patient Instructions (Signed)
Medication Instructions:   Please increase Coreg to 37.5 MG twice daily.  *If you need a refill on your cardiac medications before your next appointment, please call your pharmacy*   Lab Work:  Your provider would like for you to have following labs drawn today CMP, Lipid and Direct LDL.    If you have labs (blood work) drawn today and your tests are completely normal, you will receive your results only by: MyChart Message (if you have MyChart) OR A paper copy in the mail If you have any lab test that is abnormal or we need to change your treatment, we will call you to review the results.   Testing/Procedures: None ordered.   Follow-Up: At [facility], you and your health needs are our priority.  As part of our continuing mission to provide you with exceptional heart care, we have created designated Provider Care Teams.  These Care Teams include your primary Cardiologist (physician) and Advanced Practice Providers (APPs -  Physician Assistants and Nurse Practitioners) who all work together to provide you with the care you need, when you need it.  We recommend signing up for the patient portal called "MyChart".  Sign up information is provided on this After Visit Summary.  MyChart is used to connect with patients for Virtual Visits (Telemedicine).  Patients are able to view lab/test results, encounter notes, upcoming appointments, etc.  Non-urgent messages can be sent to your provider as well.   To learn more about what you can do with MyChart, go to [email or web address].    Your next appointment:   3 month(s)  Provider:   You may see Debbe Odea, MD or one of the following Advanced Practice Providers on your designated Care Team:   Nicolasa Ducking, NP Eula Listen, PA-C Cadence Fransico Michael, PA-C Charlsie Quest, NP Carlos Levering, NP

## 2023-11-15 NOTE — Progress Notes (Signed)
Cardiology Office Note    Date:  11/15/2023   ID:  ORIN EBERWEIN, DOB [birth year], MRN [id number]  PCP:  Danelle Berry, PA-C  Cardiologist:  Debbe Odea, MD  Electrophysiologist:  None   Chief Complaint: Follow-up  History of Present Illness:   Cory Rivas is a 58 y.o. male with history of nonobstructive CAD, HFpEF, CKD stage IIIa, IDDM, HTN, HLD, cervical myelopathy, idiopathic chronic gout, tobacco use for 35+years, asthma, obesity with OSA and tracheobronchomalacia who presents for follow-up of R/LHC and echo.   In the setting of preoperative cardiac risk stratification for murmur he underwent Lexiscan MPI in 09/2020 which showed no significant ischemia or coronary artery calcification on CT imaging.  Echo in 10/2020 showed an EF of 60 to 65%, no regional wall motion abnormalities, grade 1 diastolic dysfunction, and no significant valvular abnormalities.  Repeat echo in 08/2022 showed an EF of 60 to 65%, no regional wall motion normalities, mild LVH, grade 2 diastolic dysfunction, normal RV systolic function and ventricular cavity size mild mitral regurgitation, and aortic valve sclerosis without evidence of stenosis.   He was admitted to the hospital in 05/2023 with chest pain and shortness of breath with diaphoresis after eating dinner.  He was markedly hypertensive upon arrival with a blood pressure of 210/103.  High-sensitivity troponin 26 trending to 24.  BNP 199.  Chest x-ray without acute cardiopulmonary process.  Echo on 05/28/2023 showed an EF of 50 to 55%, no regional wall motion abnormalities, moderate LVH, grade 3 diastolic dysfunction, elevated left atrial pressure, normal RV systolic function and ventricular cavity size, mildly dilated left atrium, mild mitral regurgitation, aortic valve sclerosis without evidence of stenosis, and an estimated right atrial pressure of 3 mmHg.  R/LHC on 05/29/2023 showed moderate, nonobstructive CAD with sequential 50 to 60% ostial and  proximal LAD stenoses that were not hemodynamically significant (RFR 0.93).  There was also 40% ostial stenosis of a large OM1 branch.  The nondominant RCA was without significant disease.  Upper normal to mildly elevated left and right heart filling pressures.  Mild pulmonary hypertension.  Borderline low cardiac output/index.  Mild aortic stenosis.  Gentle diuresis and medical therapy/risk factor modification were recommended.     He was seen in hospital follow-up on 06/21/2023 and was without symptoms of angina or cardiac decompensation.  Weight was down 7 pounds by our scale.  He was adherent and tolerating cardiac medications.  He continued to smoke approximately 1/2 pack of cigarettes per day, was only taking 1 or 2 drags then letting the cigarette burn out.  Carvedilol was titrated to 12.5 mg twice daily with continuation of Jardiance, lisinopril, spironolactone, and torsemide.     He was admitted to the hospital in early 08/2023 with hypertensive urgency with blood pressure 195/95 with associated chest pressure and dyspnea with cough productive of yellow sputum.  He reported eating some chicken wings leading up to this hospitalization.  Reported adherence to medications.  High-sensitivity troponin 23 with a delta troponin of 22.  D-dimer negative.  BNP 143.  Chest x-ray without acute cardiopulmonary process.  EKG showed sinus rhythm with occasional PACs with known inferolateral T wave inversion.  He reported good urine output with IV Lasix.  With improvement in blood pressure, and chest pressure also improved.  Mildly elevated troponin felt to be reflective of supply/demand ischemia in the context of volume overload and hypertensive urgency.     He was seen in the ED in 08/2023 with chest pain  similar to his prior evaluations and admissions.  High-sensitivity troponin negative x 1.  D-dimer negative.  Chest x-ray notable for patchy left lower opacity representative of possible atelectasis or pneumonia.   He was discharged with antibiotic therapy.   He was admitted to [facility] from 1/6-10/24/2023 with acute hypoxic respiratory failure secondary to COPD exacerbation and acute on chronic HFpEF with admission complicated by acute on CKD stage IIIa.  There was concern for dietary indiscretion, patient denies this.  High-sensitivity troponin 21 with a delta troponin of 20 subsequently trended to 19.  BNP 331.  D-dimer negative.  He was treated with supplemental oxygen, IV Lasix in the ER followed by increase in home torsemide to 40 mg twice daily, Solu-Medrol, DuoNebs, and Rocephin and azithromycin.  At time of discharge, his lisinopril, spironolactone, and torsemide were held due to AKI.  At time of discharge, patient continued to note some dyspnea and chest discomfort along with dizziness.  He was last seen in our office on 10/28/2023 reporting ongoing chest discomfort and dyspnea that was unchanged when compared to his hospital presentation earlier in the month.  His weight was down 6 pounds by our scale when compared to his visit in 08/2023.  Given ongoing symptoms he underwent R/LHC on 10/30/2023 that showed moderate LAD and OM1 disease, not significantly different when compared to cath in 05/2023.  RFR of sequential ostial and proximal LAD stenosis at that time and was not hemodynamically significant.  Normal left and right heart filling pressures, mild pulmonary hypertension, and normal cardiac output and index.  Escalation of medical therapy was recommended.  Echo on 11/12/2023 showed an EF of 55 to 60%, no regional wall motion abnormalities, normal LV diastolic function parameters, normal RV systolic function and ventricular cavity size mild mitral regurgitation, and aortic valve sclerosis without evidence of stenosis.  He comes in doing well from a cardiac perspective and is without symptoms of angina or cardiac decompensation.  He feels much better today when compared to his last visit.  No dizziness, presyncope,  or syncope.  No significant lower extremity swelling or progressive orthopnea.  He reports adherence to cardiac medications.  He denies eating foods high in sodium or eating out at restaurants/takeout food frequently.  He did eat pizza last night.  Continues to smoke approximately quarter pack per day.  Reports weight is stable at home.  Indicates this is the best he has felt in quite some time.   Labs independently reviewed: 10/2023 - BUN 14, serum creatinine 1.35, potassium 4.6, albumin 3.8, AST/ALT normal, Hgb 17.2, PLT 198, BNP 39, magnesium 2.2 08/2023 - direct LDL 147 07/2023 - A1c 9.8, TC 241, TG 404, HDL 38 04/2023 - TSH normal  Past Medical History:  Diagnosis Date   Bulging of cervical intervertebral disc    CHF (congestive heart failure) (HCC)    Diabetes mellitus without complication (HCC)    Gout    High cholesterol    Hypertension    Stress due to family tension 04/15/2019    Past Surgical History:  Procedure Laterality Date   BACK SURGERY     CARDIAC CATHETERIZATION     CHOLECYSTECTOMY     COLONOSCOPY WITH PROPOFOL N/A 03/10/2020   Procedure: COLONOSCOPY WITH PROPOFOL;  Surgeon: Midge Minium, MD;  Location: [facility] ENDOSCOPY;  Service: Endoscopy;  Laterality: N/A;   COLONOSCOPY WITH PROPOFOL N/A 09/25/2023   Procedure: COLONOSCOPY WITH PROPOFOL;  Surgeon: Midge Minium, MD;  Location[facility] ENDOSCOPY;  Service: Endoscopy;  Laterality: N/A;  CORONARY PRESSURE/FFR STUDY N/A 05/29/2023   Procedure: CORONARY PRESSURE/FFR STUDY;  Surgeon: Yvonne Kendall, MD;  Location: [facility] INVASIVE CV LAB;  Service: Cardiovascular;  Laterality: N/A;   POLYPECTOMY  09/25/2023   Procedure: POLYPECTOMY;  Surgeon: Midge Minium, MD;  Location: [facility] ENDOSCOPY;  Service: Endoscopy;;   RIGHT/LEFT HEART CATH AND CORONARY ANGIOGRAPHY N/A 05/29/2023   Procedure: RIGHT/LEFT HEART CATH AND CORONARY ANGIOGRAPHY;  Surgeon: Yvonne Kendall, MD;  Location: [facility] INVASIVE CV LAB;  Service: Cardiovascular;   Laterality: N/A;   RIGHT/LEFT HEART CATH AND CORONARY ANGIOGRAPHY Bilateral 10/30/2023   Procedure: RIGHT/LEFT HEART CATH AND CORONARY ANGIOGRAPHY;  Surgeon: Yvonne Kendall, MD;  Location: [facility] INVASIVE CV LAB;  Service: Cardiovascular;  Laterality: Bilateral;    Current Medications: Current Meds  Medication Sig   allopurinol (ZYLOPRIM) 300 MG tablet TAKE ONE TABLET BY MOUTH DAILY WITH 100 MG TABLET. FOR A TOTAL OF 400 MG DAILY FOR GOUT   amLODipine (NORVASC) 10 MG tablet Take 1 tablet (10 mg total) by mouth daily.   aspirin EC 81 MG tablet Take 1 tablet (81 mg total) by mouth daily. Swallow whole.   budesonide-formoterol (SYMBICORT) 160-4.5 MCG/ACT inhaler Inhale 2 puffs into the lungs 2 (two) times daily.   Continuous Glucose Receiver (DEXCOM G7 RECEIVER) DEVI Dispense one reciever to be used with 10 d devices for IDDM   Continuous Glucose Receiver (FREESTYLE LIBRE 3 READER) DEVI Use with libre 3 device as directed   Continuous Glucose Sensor (FREESTYLE LIBRE 3 SENSOR) MISC 1 Device by Does not apply route every 14 (fourteen) days.   DULoxetine (CYMBALTA) 30 MG capsule Take 60 mg (2 capsules) poqam and 30 mg (1 capsule) poqpm daily   empagliflozin (JARDIANCE) 10 MG TABS tablet Take 1 tablet (10 mg total) by mouth daily.   ezetimibe (ZETIA) 10 MG tablet Take 1 tablet (10 mg total) by mouth daily.   fluticasone (FLONASE) 50 MCG/ACT nasal spray Place 1 spray into both nostrils daily.   insulin glargine (LANTUS SOLOSTAR) 100 UNIT/ML Solostar Pen Inject 20 Units into the skin at bedtime.   insulin lispro (HUMALOG) 100 UNIT/ML KwikPen Inject 8 Units into the skin 3 (three) times daily. Sliding scale up to 45 units per injection   ipratropium-albuterol (DUONEB) 0.5-2.5 (3) MG/3ML SOLN Take 3 mLs by nebulization every 6 (six) hours as needed (SOB wheeze cough).   levocetirizine (XYZAL) 5 MG tablet TAKE ONE TABLET BY MOUTH EVERY EVENING   lisinopril (ZESTRIL) 40 MG tablet Take 1 tablet (40 mg total)  by mouth daily.   metFORMIN (GLUCOPHAGE-XR) 500 MG 24 hr tablet Take 1,000 mg by mouth 2 (two) times daily.   pantoprazole (PROTONIX) 40 MG tablet Take 1 tablet (40 mg total) by mouth daily.   pregabalin (LYRICA) 50 MG capsule Take 1 capsule (50 mg total) by mouth 2 (two) times daily.   rosuvastatin (CRESTOR) 40 MG tablet Take 1 tablet (40 mg total) by mouth daily.   Tiotropium Bromide Monohydrate (SPIRIVA RESPIMAT) 2.5 MCG/ACT AERS Inhale 2 puffs into the lungs daily.   Torsemide 40 MG TABS Take 40 mg by mouth daily.   TRULICITY 3 MG/0.5ML SOPN Inject 3 mg into the skin once a week. SATURDAYS   VENTOLIN HFA 108 (90 Base) MCG/ACT inhaler INHALE TWO PUFFS INTO THE LUNGS EVERY SIX HOURS AS NEEDED FOR WHEEZING OR SHORTNESS OF BREATH   [DISCONTINUED] carvedilol (COREG) 25 MG tablet Take 1 tablet (25 mg total) by mouth 2 (two) times daily.    Allergies:   Oxycodone,  Entresto [sacubitril-valsartan], Onion, and Nitroglycerin   Social History   Socioeconomic History   Marital status: Divorced    Spouse name: Not on file   Number of children: 0   Years of education: 9   Highest education level: 9th grade  Occupational History   Occupation: disability  Tobacco Use   Smoking status: Some Days    Current packs/day: 0.00    Average packs/day: 1 pack/day for 37.0 years (37.0 ttl pk-yrs)    Types: Cigarettes    Start date: 10/19/1989    Last attempt to quit: 08/20/2022    Years since quitting: 1.2   Smokeless tobacco: Never   Tobacco comments:    30+ years hx smoking 2 ppd, stopped smoking briefly in April 2022 for a surgery and then restarted smoking about 1/2 ppd last 4-5 months         5-6cigs daily- 04/10/2023  Vaping Use   Vaping status: Never Used  Substance and Sexual Activity   Alcohol use: No   Drug use: No   Sexual activity: Not Currently    Partners: Female  Other Topics Concern   Not on file  Social History Narrative   Lives with sister [name], brother in law [name]), and his  mother - Adriane Guglielmo   Social Drivers of Health   Financial Resource Strain: Medium Risk (12/06/2022)   Overall Financial Resource Strain (CARDIA)    Difficulty of Paying Living Expenses: Somewhat hard  Food Insecurity: No Food Insecurity (10/23/2023)   Hunger Vital Sign    Worried About Running Out of Food in the Last Year: Never true    Ran Out of Food in the Last Year: Never true  Transportation Needs: Patient Declined (10/23/2023)   PRAPARE - Transportation    Lack of Transportation (Medical): Patient declined    Lack of Transportation (Non-Medical): Patient declined  Physical Activity: Insufficiently Active (12/06/2022)   Exercise Vital Sign    Days of Exercise per Week: 3 days    Minutes of Exercise per Session: 10 min  Stress: No Stress Concern Present (12/06/2022)   [employer] of Occupational Health - Occupational Stress Questionnaire    Feeling of Stress : Only a little  Social Connections: Socially Isolated (12/06/2022)   Social Connection and Isolation Panel [NHANES]    Frequency of Communication with Friends and Family: More than three times a week    Frequency of Social Gatherings with Friends and Family: Twice a week    Attends Religious Services: Never    Database administrator or Organizations: No    Attends Engineer, structural: Never    Marital Status: Divorced     Family History:  The patient's family history includes AAA (abdominal aortic aneurysm) in his maternal grandmother; Diabetes in his maternal grandfather and mother; Heart attack in his maternal grandmother and mother; Heart disease in his mother; Hyperlipidemia in his mother; Hypertension in his father and mother; Lung cancer in his father; Stroke in his father.  ROS:   12-point review of systems is negative unless otherwise noted in the HPI.   EKGs/Labs/Other Studies Reviewed:    Studies reviewed were summarized above. The additional studies were reviewed today:  2D echo 11/12/2023: 1.  Left ventricular ejection fraction, by estimation, is 55 to 60%. The  left ventricle has normal function. The left ventricle has no regional  wall motion abnormalities. Left ventricular diastolic parameters were  normal. The average left ventricular  global longitudinal strain is -13.3 %.  2. Right ventricular systolic function is normal. The right ventricular  size is normal. Tricuspid regurgitation signal is inadequate for assessing  PA pressure.   3. The mitral valve is normal in structure. Mild mitral valve  regurgitation. No evidence of mitral stenosis.   4. The aortic valve is normal in structure. There is mild calcification  of the aortic valve. Aortic valve regurgitation is not visualized. Aortic  valve sclerosis is present, with no evidence of aortic valve stenosis.   5. The inferior vena cava is normal in size with greater than 50%  respiratory variability, suggesting right atrial pressure of 3 mmHg.  __________  [facility] 10/30/2023: Conclusions: Moderate LAD and OM1 disease, not significantly different compared to prior catheterization in 05/2023.  RFR of sequential ostial and proximal LAD stenosis at that time was not hemodynamically significant. Normal left and right heart filling pressures. Mild pulmonary hypertension. Normal Fick cardiac output/index.   Recommendations: Increase amlodipine to 10 mg daily for BP control and antianginal therapy. Consider resumption of lisinopril +/- spironolactone at follow-up based on blood pressure and renal function. Continue aggressive secondary prevention of coronary artery disease. __________  [facility] 05/29/2023: Conclusions: Moderate, nonobstructive coronary artery disease with sequential 50-60% ostial and proximal LAD stenoses that are not hemodynamically significant (RFR = 0.93).  There is also a 40% ostial stenosis of a large first OM branch.  Nondominant RCA is without significant disease. Upper normal to mildly elevated left and  right heart filling pressures. Mild pulmonary hypertension. Borderline low Fick cardiac output/index. Mild aortic stenosis.   Recommendations: Resume gentle diuresis tomorrow if renal function is stable. Medical therapy and risk factor modification to prevent progression of coronary artery disease. __________   2D echo 05/28/2023: 1. Left ventricular ejection fraction, by estimation, is 50 to 55%. The  left ventricle has low normal function. The left ventricle has no regional  wall motion abnormalities. There is moderate left ventricular hypertrophy.  Left ventricular diastolic  parameters are consistent with Grade III diastolic dysfunction  (restrictive). Elevated left atrial pressure. The average left ventricular  global longitudinal strain is -10.0 %. The global longitudinal strain is  abnormal.   2. Right ventricular systolic function is normal. The right ventricular  size is normal. Tricuspid regurgitation signal is inadequate for assessing  PA pressure.   3. Left atrial size was mildly dilated.   4. The mitral valve was not well visualized. Mild mitral valve  regurgitation.   5. The aortic valve has an indeterminant number of cusps. There is  moderate calcification of the aortic valve. There is moderate thickening  of the aortic valve. Aortic valve regurgitation is not visualized. Aortic  valve sclerosis/calcification is  present, without any evidence of aortic stenosis.   6. The inferior vena cava is normal in size with greater than 50%  respiratory variability, suggesting right atrial pressure of 3 mmHg.  __________   2D echo 08/23/2022: 1. Left ventricular ejection fraction, by estimation, is 60 to 65%. The  left ventricle has normal function. The left ventricle has no regional  wall motion abnormalities. There is mild left ventricular hypertrophy.  Left ventricular diastolic parameters  are consistent with Grade II diastolic dysfunction (pseudonormalization).   2.  Right ventricular systolic function is normal. The right ventricular  size is normal.   3. The mitral valve is normal in structure. Mild mitral valve  regurgitation. No evidence of mitral stenosis.   4. The aortic valve is calcified. Aortic valve regurgitation is not  visualized. Aortic  valve sclerosis/calcification is present, without any  evidence of aortic stenosis.   5. The inferior vena cava is normal in size with greater than 50%  respiratory variability, suggesting right atrial pressure of 3 mmHg.  __________   2D echo 11/04/2020: 1. Left ventricular ejection fraction, by estimation, is 60 to 65%. The  left ventricle has normal function. The left ventricle has no regional  wall motion abnormalities. Left ventricular diastolic parameters are  consistent with Grade I diastolic  dysfunction (impaired relaxation).There is mild left ventricular  hypertrophy of the basal segment. LVOT gradient of 10 mm Hg at rest, 22 mm  Hg gradient with valsalva   2. Right ventricular systolic function is normal. The right ventricular  size is normal. There is normal pulmonary artery systolic pressure. The  estimated right ventricular systolic pressure is 31.6 mmHg.   3. Left atrial size was mildly dilated.  ____________   Eugenie Birks MPI 10/10/2020: Low risk, probably normal pharmacologic myocardial perfusion stress tst. There is a small in size, moderate in severity, fixed defect at the apex most likely representing artifact (apical thinning and attenuation) and less likely scar. There is no evidence of significant ischemia. The left ventricular ejection fraction is normal (59%). There is no significant coronary artery calcification on the attenuation correction CT.   EKG:  EKG is ordered today.  The EKG ordered today demonstrates NSR, 66 bpm, rare PAC, nonspecific st/t changes  Recent Labs: 10/24/2023: Magnesium 2.2 10/28/2023: BNP 39.4; Platelets 198 10/30/2023: Hemoglobin 14.6 11/01/2023: ALT 26;  BUN 14; Creat 1.35; Potassium 4.6; Sodium 141  Recent Lipid Panel    Component Value Date/Time   CHOL 241 (H) 08/09/2023 1156   TRIG 404 (H) 08/09/2023 1156   HDL 38 (L) 08/09/2023 1156   CHOLHDL 6.3 (H) 08/09/2023 1156   VLDL 53 (H) 06/08/2022 1534   LDLCALC  08/09/2023 1156     Comment:     . LDL cholesterol not calculated. Triglyceride levels greater than 400 mg/dL invalidate calculated LDL results. . Reference range: <100 . Desirable range <100 mg/dL for primary prevention;   <70 mg/dL for patients with CHD or diabetic patients  with > or = 2 CHD risk factors. Marland Kitchen LDL-C is now calculated using the Martin-Hopkins  calculation, which is a validated novel method providing  better accuracy than the Friedewald equation in the  estimation of LDL-C.  Horald Pollen et al. Lenox Ahr. 0454;098(11): 2061-2068  (http://education.[employer].com/faq/FAQ164)    LDLDIRECT 147 (H) 08/19/2023 2222    PHYSICAL EXAM:    VS:  BP (!) 156/95 (BP Location: Left Arm, Patient Position: Sitting, Cuff Size: Normal)   Pulse 66   Ht 5\' 6"  (1.676 m)   Wt 223 lb 6.4 oz (101.3 kg)   SpO2 97%   BMI 36.06 kg/m   BMI: Body mass index is 36.06 kg/m.  Physical Exam Vitals reviewed.  Constitutional:      Appearance: He is well-developed.  HENT:     Head: Normocephalic and atraumatic.  Eyes:     General:        Right eye: No discharge.        Left eye: No discharge.  Neck:     Comments: JVD difficult to visualize secondary to body habitus. Cardiovascular:     Rate and Rhythm: Normal rate and regular rhythm.     Heart sounds: S1 normal and S2 normal. Heart sounds not distant. No midsystolic click and no opening snap. Murmur heard.     Systolic murmur is  present with a grade of 1/6 at the upper right sternal border.     No friction rub.  Pulmonary:     Effort: Pulmonary effort is normal. No respiratory distress.     Breath sounds: Normal breath sounds. No decreased breath sounds, wheezing, rhonchi  or rales.  Chest:     Chest wall: No tenderness.  Abdominal:     General: There is no distension.  Musculoskeletal:     Cervical back: Normal range of motion.     Right lower leg: No edema.     Left lower leg: No edema.  Skin:    General: Skin is warm and dry.     Nails: There is no clubbing.  Neurological:     Mental Status: He is alert and oriented to person, place, and time.  Psychiatric:        Speech: Speech normal.        Behavior: Behavior normal.        Thought Content: Thought content normal.        Judgment: Judgment normal.     Wt Readings from Last 3 Encounters:  11/15/23 223 lb 6.4 oz (101.3 kg)  11/11/23 221 lb (100.2 kg)  11/05/23 220 lb (99.8 kg)     ASSESSMENT & PLAN:   CAD involving the native coronary arteries with stable angina: He is overall doing well from a cardiac perspective and without symptoms of angina or cardiac decompensation.  Recent LHC earlier this month showed stable coronary anatomy as outlined above with medical therapy recommended.  Continue aggressive risk factor modification and primary prevention including aspirin 81 mg, amlodipine 10 mg, titrated dose of carvedilol 37.5 mg twice daily, ezetimibe 10 mg, and rosuvastatin 40 mg.  No indication for further ischemic testing at this time.  HFpEF: Appears well compensated and largely euvolemic.  Remains off spironolactone with noted AKI.  Continue Jardiance 10 mg and torsemide 40 mg.  Check BMP today.  If renal function and potassium are stable consider reinitiation of spironolactone.  Defer rechallenge of ARNI with previously noted dizziness on Entresto.  Nonadherence to CPAP is likely contributing to symptoms.  Pulmonary hypertension: Mild by LHC earlier this month.  Suspected to be in the setting of obesity and obstructive sleep apnea.  Follow-up with pulmonology in CHF clinic.  HTN: Blood pressure is elevated in the office today.  Titrate carvedilol to 37.5 mg twice daily with continuation of  amlodipine 10 mg and lisinopril 40 mg.  HLD: LDL 147 in 08/2023 with target LDL less than 70.  Reports adherence to ezetimibe and rosuvastatin.  Denies high-fat diet.  Check LFT, lipid panel, and direct LDL with recommendation to escalate lipid-lowering therapy as indicated to achieve target LDL less than 70.  CKD stage IIIa: Check BMP.  Obesity with asthma, OSA, and tracheobronchomalacia: Contributing to her overall presentation.  Tobacco use: Complete cessation recommended.      Disposition: F/u with Dr. Azucena Cecil or an APP in 3 months.   Medication Adjustments/Labs and Tests Ordered: Current medicines are reviewed at length with the patient today.  Concerns regarding medicines are outlined above. Medication changes, Labs and Tests ordered today are summarized above and listed in the Patient Instructions accessible in Encounters.   Signed, Eula Listen, PA-C 11/15/2023 9:53 AM     [facility] [place] [address] [place], [place] [address] [phone]

## 2023-11-16 LAB — LIPID PANEL
Chol/HDL Ratio: 4.3 {ratio} (ref 0.0–5.0)
Cholesterol, Total: 178 mg/dL (ref 100–199)
HDL: 41 mg/dL (ref >39)
LDL Chol Calc (NIH): 101 mg/dL — ABNORMAL HIGH (ref 0–99)
Triglycerides: 209 mg/dL — ABNORMAL HIGH (ref 0–149)
VLDL Cholesterol Cal: 36 mg/dL (ref 5–40)

## 2023-11-16 LAB — COMPREHENSIVE METABOLIC PANEL
ALT: 21 [IU]/L (ref 0–44)
AST: 19 [IU]/L (ref 0–40)
Albumin: 4.2 g/dL (ref 3.8–4.9)
Alkaline Phosphatase: 80 [IU]/L (ref 44–121)
BUN/Creatinine Ratio: 14 (ref 9–20)
BUN: 17 mg/dL (ref 6–24)
Bilirubin Total: 0.4 mg/dL (ref 0.0–1.2)
CO2: 24 mmol/L (ref 20–29)
Calcium: 9.5 mg/dL (ref 8.7–10.2)
Chloride: 102 mmol/L (ref 96–106)
Creatinine, Ser: 1.18 mg/dL (ref 0.76–1.27)
Globulin, Total: 2.7 g/dL (ref 1.5–4.5)
Glucose: 130 mg/dL — ABNORMAL HIGH (ref 70–99)
Potassium: 4.3 mmol/L (ref 3.5–5.2)
Sodium: 142 mmol/L (ref 134–144)
Total Protein: 6.9 g/dL (ref 6.0–8.5)
eGFR: 72 mL/min/{1.73_m2} (ref >59)

## 2023-11-16 LAB — LDL CHOLESTEROL, DIRECT: LDL Direct: 106 mg/dL — ABNORMAL HIGH (ref 0–99)

## 2023-11-18 ENCOUNTER — Telehealth: Payer: Self-pay | Admitting: Pharmacy Technician

## 2023-11-18 ENCOUNTER — Other Ambulatory Visit (HOSPITAL_COMMUNITY): Payer: Self-pay

## 2023-11-18 NOTE — Telephone Encounter (Signed)
-----   Message from Nurse Nell Range sent at 11/18/2023  8:40 AM EST ----- MyChart then Call  Message routed to RX prior auth team

## 2023-11-18 NOTE — Telephone Encounter (Signed)
Pharmacy Patient Advocate Encounter  Received notification from [facility] that Prior Authorization for repatha has been APPROVED from 11/18/23 to 11/17/24. Ran test claim, Copay is $4.00 one month. This test claim was processed through [facility]- copay amounts may vary at other pharmacies due to pharmacy/plan contracts, or as the patient moves through the different stages of their insurance plan.   PA #/Case ID/Reference #: [id number]

## 2023-11-18 NOTE — Telephone Encounter (Signed)
Pharmacy Patient Advocate Encounter   Received notification from Physician's Office that prior authorization for repatha is required/requested.   Insurance verification completed.   The patient is insured through [facility] .   Per test claim: PA required; PA submitted to above mentioned insurance via CoverMyMeds Key/confirmation #/EOC [id number] Status is pending

## 2023-11-20 NOTE — Telephone Encounter (Signed)
 [facility] att - attempting to notify pt Repatha  is approved

## 2023-11-27 ENCOUNTER — Other Ambulatory Visit: Payer: Self-pay | Admitting: Family Medicine

## 2023-11-27 ENCOUNTER — Other Ambulatory Visit: Payer: Self-pay | Admitting: Emergency Medicine

## 2023-11-27 DIAGNOSIS — Z79899 Other long term (current) drug therapy: Secondary | ICD-10-CM

## 2023-11-27 MED ORDER — REPATHA SURECLICK 140 MG/ML ~~LOC~~ SOAJ: 140.0000 mg | SUBCUTANEOUS | 3 refills | Status: DC

## 2023-11-28 ENCOUNTER — Ambulatory Visit: Payer: Medicaid Other | Admitting: Family Medicine

## 2023-11-28 ENCOUNTER — Encounter: Payer: Self-pay | Admitting: Family Medicine

## 2023-11-28 VITALS — BP 120/70 | HR 86 | Resp 16 | Ht 66.0 in | Wt 226.0 lb

## 2023-11-28 DIAGNOSIS — Z794 Long term (current) use of insulin: Secondary | ICD-10-CM

## 2023-11-28 DIAGNOSIS — I10 Essential (primary) hypertension: Secondary | ICD-10-CM | POA: Diagnosis not present

## 2023-11-28 DIAGNOSIS — E785 Hyperlipidemia, unspecified: Secondary | ICD-10-CM

## 2023-11-28 DIAGNOSIS — E1165 Type 2 diabetes mellitus with hyperglycemia: Secondary | ICD-10-CM

## 2023-11-28 DIAGNOSIS — E1169 Type 2 diabetes mellitus with other specified complication: Secondary | ICD-10-CM | POA: Diagnosis not present

## 2023-11-28 DIAGNOSIS — K219 Gastro-esophageal reflux disease without esophagitis: Secondary | ICD-10-CM

## 2023-11-28 LAB — POCT GLYCOSYLATED HEMOGLOBIN (HGB A1C): Hemoglobin A1C: 7.9 % — AB (ref 4.0–5.6)

## 2023-11-28 NOTE — Progress Notes (Signed)
Name: Cory Rivas   MRN: [id number]    DOB: 29-Jun-1966   Date:11/28/2023       Progress Note  Chief Complaint  Patient presents with   Medical Management of Chronic Issues    3 months   Diabetes   Hypertension     Subjective:   Cory Rivas is a 58 y.o. male, presents to clinic for routine follow up on chronic conditions- he was recently seen and was told to do 3 month f/up scheduled in April, but he is here today still for routine f/up as well   DM he managed with endocrinology- not seen for about 9 months, uncontrolled Lab Results  Component Value Date   HGBA1C 9.8 (H) 08/09/2023   HTN and CHF managed by cardiology BP controlled today  Pt denies CP, SOB, exertional sx, LE edema, palpitation, Ha's, visual disturbances, lightheadedness, hypotension, syncope.  GERD Some indigestion abdominal pain increased burping and threw up a few times, he is not taking his PPI    Current Outpatient Medications:    allopurinol (ZYLOPRIM) 300 MG tablet, TAKE ONE TABLET BY MOUTH DAILY WITH 100 MG TABLET. FOR A TOTAL OF 400 MG DAILY FOR GOUT, Disp: 90 tablet, Rfl: 1   amLODipine (NORVASC) 10 MG tablet, Take 1 tablet (10 mg total) by mouth daily., Disp: 30 tablet, Rfl: 5   aspirin EC 81 MG tablet, Take 1 tablet (81 mg total) by mouth daily. Swallow whole., Disp: 90 tablet, Rfl: 3   budesonide-formoterol (SYMBICORT) 160-4.5 MCG/ACT inhaler, Inhale 2 puffs into the lungs 2 (two) times daily., Disp: 1 each, Rfl: 12   carvedilol (COREG) 25 MG tablet, Take 1.5 tablets (37.5 mg total) by mouth 2 (two) times daily., Disp: 270 tablet, Rfl: 2   Continuous Glucose Receiver (DEXCOM G7 RECEIVER) DEVI, Dispense one reciever to be used with 10 d devices for IDDM, Disp: 1 each, Rfl: 0   Continuous Glucose Receiver (FREESTYLE LIBRE 3 READER) DEVI, Use with libre 3 device as directed, Disp: 1 each, Rfl: 0   Continuous Glucose Sensor (FREESTYLE LIBRE 3 SENSOR) MISC, 1 Device by Does not apply route every  14 (fourteen) days., Disp: 6 each, Rfl: 2   DULoxetine (CYMBALTA) 30 MG capsule, Take 60 mg (2 capsules) poqam and 30 mg (1 capsule) poqpm daily, Disp: 270 capsule, Rfl: 1   empagliflozin (JARDIANCE) 10 MG TABS tablet, Take 1 tablet (10 mg total) by mouth daily., Disp: 90 tablet, Rfl: 1   Evolocumab (REPATHA SURECLICK) 140 MG/ML SOAJ, Inject 140 mg into the skin every 14 (fourteen) days., Disp: 6 mL, Rfl: 3   ezetimibe (ZETIA) 10 MG tablet, Take 1 tablet (10 mg total) by mouth daily., Disp: 90 tablet, Rfl: 1   fluticasone (FLONASE) 50 MCG/ACT nasal spray, Place 1 spray into both nostrils daily., Disp: 18.2 mL, Rfl: 6   insulin glargine (LANTUS SOLOSTAR) 100 UNIT/ML Solostar Pen, Inject 20 Units into the skin at bedtime., Disp: , Rfl:    insulin lispro (HUMALOG) 100 UNIT/ML KwikPen, Inject 8 Units into the skin 3 (three) times daily. Sliding scale up to 45 units per injection, Disp: , Rfl:    ipratropium-albuterol (DUONEB) 0.5-2.5 (3) MG/3ML SOLN, Take 3 mLs by nebulization every 6 (six) hours as needed (SOB wheeze cough)., Disp: 180 mL, Rfl: 1   levocetirizine (XYZAL) 5 MG tablet, TAKE ONE TABLET BY MOUTH EVERY EVENING, Disp: 90 tablet, Rfl: 1   lisinopril (ZESTRIL) 40 MG tablet, Take 1 tablet (40 mg total) by  mouth daily., Disp: 90 tablet, Rfl: 1   metFORMIN (GLUCOPHAGE-XR) 500 MG 24 hr tablet, Take 1,000 mg by mouth 2 (two) times daily., Disp: , Rfl:    pantoprazole (PROTONIX) 40 MG tablet, Take 1 tablet (40 mg total) by mouth daily., Disp: 90 tablet, Rfl: 1   pregabalin (LYRICA) 50 MG capsule, Take 1 capsule (50 mg total) by mouth 2 (two) times daily., Disp: 180 capsule, Rfl: 0   rosuvastatin (CRESTOR) 40 MG tablet, Take 1 tablet (40 mg total) by mouth daily., Disp: 90 tablet, Rfl: 1   Tiotropium Bromide Monohydrate (SPIRIVA RESPIMAT) 2.5 MCG/ACT AERS, Inhale 2 puffs into the lungs daily., Disp: 30 each, Rfl: 11   Torsemide 40 MG TABS, Take 40 mg by mouth daily., Disp: 30 tablet, Rfl: 0    TRULICITY 3 MG/0.5ML SOPN, Inject 3 mg into the skin once a week. SATURDAYS, Disp: , Rfl:    VENTOLIN HFA 108 (90 Base) MCG/ACT inhaler, INHALE TWO PUFFS INTO THE LUNGS EVERY SIX HOURS AS NEEDED FOR WHEEZING OR SHORTNESS OF BREATH, Disp: 18 each, Rfl: 2  Patient Active Problem List   Diagnosis Date Noted   COPD with acute exacerbation (HCC) 10/21/2023   History of colon polyps 09/25/2023   Myocardial injury 08/19/2023   Type II diabetes mellitus with renal manifestations (HCC) 08/19/2023   Obesity (BMI 30-39.9) 08/19/2023   HLD (hyperlipidemia) 08/19/2023   Chronic diastolic CHF (congestive heart failure) (HCC) 08/19/2023   Diarrhea 08/19/2023   CAD (coronary artery disease) 08/19/2023   CKD (chronic kidney disease) stage 2, GFR 60-89 ml/min 08/19/2023   Acute on chronic diastolic heart failure (HCC) 05/30/2023   Unstable angina (HCC) 05/29/2023   Chest pain 05/27/2023   Type 2 diabetes mellitus with peripheral neuropathy (HCC) 05/27/2023   Gastroesophageal reflux disease without esophagitis 05/27/2023   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   Dyslipidemia 05/27/2023   Acute decompensated heart failure (HCC) 11/23/2022   Hypertensive emergency 11/22/2022   Acute respiratory failure with hypoxia (HCC) 11/22/2022   Elevated troponin 11/22/2022   Chronic heart failure with preserved ejection fraction (HCC) 08/24/2022   Acute on chronic diastolic congestive heart failure (HCC) 08/23/2022   Class 2 obesity 08/23/2022   Uncontrolled diabetes mellitus with hyperglycemia, with long-term current use of insulin (HCC) 08/23/2022   Grade I diastolic dysfunction 06/08/2022   LVH (left ventricular hypertrophy) 06/08/2022   Accelerated hypertension 06/08/2022   S/P cervical spinal fusion 08/18/2021   Former heavy tobacco smoker 08/18/2021   Gout 08/18/2021   Other chronic pain 10/26/2020   Systolic murmur 10/26/2020   Tobacco use 10/26/2020   Polyp of ascending colon    Rectal polyp    Class 2  severe obesity with serious comorbidity and body mass index (BMI) of 39.0 to 39.9 in adult [facility]) 04/15/2019   Chronic kidney disease (CKD) stage G2/A3, mildly decreased glomerular filtration rate (GFR) between 60-89 mL/min/1.73 square meter and albuminuria creatinine ratio greater than 300 mg/g 04/15/2019   Proteinuria 04/15/2019   Hyperlipidemia associated with type 2 diabetes mellitus (HCC) 03/18/2019   Diabetes mellitus (HCC) 03/18/2019   Idiopathic chronic gout of multiple sites without tophus 03/18/2019   Essential hypertension 03/18/2019   Dermatitis 03/18/2019   Lumbar degenerative disc disease 11/22/2015   Cervical myelopathy (HCC) 11/22/2015   Osteoarthritis of spine with radiculopathy, lumbar region 11/22/2015   Degenerative disc disease, cervical 11/22/2015   Osteoarthritis of spine with radiculopathy, cervical region 11/22/2015    Past Surgical History:  Procedure Laterality Date  BACK SURGERY     CARDIAC CATHETERIZATION     CHOLECYSTECTOMY     COLONOSCOPY WITH PROPOFOL N/A 03/10/2020   Procedure: COLONOSCOPY WITH PROPOFOL;  Surgeon: Midge Minium, MD;  Location: [facility] ENDOSCOPY;  Service: Endoscopy;  Laterality: N/A;   COLONOSCOPY WITH PROPOFOL N/A 09/25/2023   Procedure: COLONOSCOPY WITH PROPOFOL;  Surgeon: Midge Minium, MD;  Location: [facility] ENDOSCOPY;  Service: Endoscopy;  Laterality: N/A;   CORONARY PRESSURE/FFR STUDY N/A 05/29/2023   Procedure: CORONARY PRESSURE/FFR STUDY;  Surgeon: Yvonne Kendall, MD;  Location: [facility] INVASIVE CV LAB;  Service: Cardiovascular;  Laterality: N/A;   POLYPECTOMY  09/25/2023   Procedure: POLYPECTOMY;  Surgeon: Midge Minium, MD;  Location: [facility] ENDOSCOPY;  Service: Endoscopy;;   RIGHT/LEFT HEART CATH AND CORONARY ANGIOGRAPHY N/A 05/29/2023   Procedure: RIGHT/LEFT HEART CATH AND CORONARY ANGIOGRAPHY;  Surgeon: Yvonne Kendall, MD;  Location: [facility] INVASIVE CV LAB;  Service: Cardiovascular;  Laterality: N/A;   RIGHT/LEFT HEART CATH AND CORONARY  ANGIOGRAPHY Bilateral 10/30/2023   Procedure: RIGHT/LEFT HEART CATH AND CORONARY ANGIOGRAPHY;  Surgeon: Yvonne Kendall, MD;  Location: [facility] INVASIVE CV LAB;  Service: Cardiovascular;  Laterality: Bilateral;    Family History  Problem Relation Age of Onset   Heart disease Mother    Diabetes Mother    Hyperlipidemia Mother    Hypertension Mother    Heart attack Mother    Lung cancer Father    Hypertension Father    Stroke Father    AAA (abdominal aortic aneurysm) Maternal Grandmother    Heart attack Maternal Grandmother    Diabetes Maternal Grandfather     Social History   Tobacco Use   Smoking status: Some Days    Current packs/day: 0.00    Average packs/day: 1 pack/day for 37.0 years (37.0 ttl pk-yrs)    Types: Cigarettes    Start date: 10/19/1989    Last attempt to quit: 08/20/2022    Years since quitting: 1.2   Smokeless tobacco: Never   Tobacco comments:    30+ years hx smoking 2 ppd, stopped smoking briefly in April 2022 for a surgery and then restarted smoking about 1/2 ppd last 4-5 months         5-6cigs daily- 04/10/2023  Vaping Use   Vaping status: Never Used  Substance Use Topics   Alcohol use: No   Drug use: No     Allergies  Allergen Reactions   Oxycodone Nausea And Vomiting   Entresto [Sacubitril-Valsartan] Nausea Only and Other (See Comments)    Lightheaded and dizzy    Onion Nausea And Vomiting   Nitroglycerin Itching and Rash    Health Maintenance  Topic Date Due   COVID-19 Vaccine (4 - 2024-25 season) 06/16/2023   HEMOGLOBIN A1C  02/07/2024   OPHTHALMOLOGY EXAM  03/04/2024   Lung Cancer Screening  03/08/2024   Diabetic kidney evaluation - Urine ACR  03/20/2024   FOOT EXAM  08/27/2024   Diabetic kidney evaluation - eGFR measurement  11/14/2024   Colonoscopy  09/24/2028   DTaP/Tdap/Td (2 - Td or Tdap) 04/14/2029   Pneumococcal Vaccine 40-5 Years old  Completed   INFLUENZA VACCINE  Completed   Hepatitis C Screening  Completed   HIV Screening   Completed   Zoster Vaccines- Shingrix  Completed   HPV VACCINES  Aged Out    Chart Review Today: I personally reviewed active problem list, medication list, allergies, family history, social history, health maintenance, notes from last encounter, lab results, imaging with the patient/caregiver today.   Review  of Systems  Constitutional: Negative.   HENT: Negative.    Eyes: Negative.   Respiratory: Negative.    Cardiovascular: Negative.   Gastrointestinal: Negative.   Endocrine: Negative.   Genitourinary: Negative.   Musculoskeletal: Negative.   Skin: Negative.   Allergic/Immunologic: Negative.   Neurological: Negative.   Hematological: Negative.   Psychiatric/Behavioral: Negative.    All other systems reviewed and are negative.    Objective:   Vitals:   11/28/23 1026  BP: 120/70  Pulse: 86  Resp: 16  SpO2: 97%  Weight: 226 lb (102.5 kg)  Height: 5\' 6"  (1.676 m)    Body mass index is 36.48 kg/m.  Physical Exam Vitals and nursing note reviewed.  Constitutional:      General: He is not in acute distress.    Appearance: Normal appearance. He is well-developed. He is obese. He is not ill-appearing, toxic-appearing or diaphoretic.  HENT:     Head: Normocephalic and atraumatic.     Nose: Nose normal.  Eyes:     General:        Right eye: No discharge.        Left eye: No discharge.     Conjunctiva/sclera: Conjunctivae normal.  Neck:     Trachea: No tracheal deviation.  Cardiovascular:     Rate and Rhythm: Normal rate and regular rhythm.     Pulses: Normal pulses.     Heart sounds: Normal heart sounds.  Pulmonary:     Effort: Pulmonary effort is normal. No respiratory distress.     Breath sounds: Normal breath sounds. No stridor.  Abdominal:     General: Bowel sounds are normal. There is no distension.     Tenderness: There is no abdominal tenderness.  Musculoskeletal:     Right lower leg: No edema.     Left lower leg: No edema.  Skin:    General: Skin  is warm and dry.     Findings: No rash.  Neurological:     Mental Status: He is alert.     Motor: No abnormal muscle tone.     Coordination: Coordination normal.  Psychiatric:        Mood and Affect: Mood normal.        Behavior: Behavior normal.      Functional Status Survey:   Results for orders placed or performed in visit on 11/15/23  Comprehensive metabolic panel   Collection Time: 11/15/23  9:43 AM  Result Value Ref Range   Glucose 130 (H) 70 - 99 mg/dL   BUN 17 6 - 24 mg/dL   Creatinine, Ser 2.95 0.76 - 1.27 mg/dL   eGFR 72 >62 ZH/YQM/5.78   BUN/Creatinine Ratio 14 9 - 20   Sodium 142 134 - 144 mmol/L   Potassium 4.3 3.5 - 5.2 mmol/L   Chloride 102 96 - 106 mmol/L   CO2 24 20 - 29 mmol/L   Calcium 9.5 8.7 - 10.2 mg/dL   Total Protein 6.9 6.0 - 8.5 g/dL   Albumin 4.2 3.8 - 4.9 g/dL   Globulin, Total 2.7 1.5 - 4.5 g/dL   Bilirubin Total 0.4 0.0 - 1.2 mg/dL   Alkaline Phosphatase 80 44 - 121 IU/L   AST 19 0 - 40 IU/L   ALT 21 0 - 44 IU/L  Lipid panel   Collection Time: 11/15/23  9:43 AM  Result Value Ref Range   Cholesterol, Total 178 100 - 199 mg/dL   Triglycerides 469 (H) 0 - 149 mg/dL  HDL 41 >39 mg/dL   VLDL Cholesterol Cal 36 5 - 40 mg/dL   LDL Chol Calc (NIH) 469 (H) 0 - 99 mg/dL   LDL CALC COMMENT: Comment    Chol/HDL Ratio 4.3 0.0 - 5.0 ratio  LDL cholesterol, direct   Collection Time: 11/15/23  9:43 AM  Result Value Ref Range   LDL Direct 106 (H) 0 - 99 mg/dL      Assessment & Plan:   Uncontrolled diabetes mellitus with hyperglycemia, with long-term current use of insulin (HCC) Assessment & Plan: Per Endo, however he hasn't seen them since last July 2024 - at [facility] and he has transportation issues Uncontrolled on basal and mealtime insulin, trulicity, jardiance and metformin UTD on his foot and eye exam He has been working on diet very diligently POC A1C today much improved [facility] [facility] to get established locally (denied  referral at [facility])  Orders: -     Ambulatory referral to Endocrinology -     POCT glycosylated hemoglobin (Hb A1C)  Hyperlipidemia associated with type 2 diabetes mellitus (HCC) Assessment & Plan: Cardiology is adding repatha, LDL rechecked by them and still not at goal   Essential hypertension Assessment & Plan: Bp at goal today on amlodipine, coreg, lisinopril and back on his diuretics for CHF Labs recently checked by cardiology and electrolytes and renal function good BP Readings from Last 3 Encounters:  11/28/23 120/70  11/15/23 (!) 156/95  11/11/23 118/70      Gastroesophageal reflux disease without esophagitis Assessment & Plan: More symptomatic  Not on his PPI Encouraged to restart protonix daily      He has f/up appt scheduled in April   Danelle Berry, PA-C 11/28/23 10:43 AM

## 2023-11-28 NOTE — Assessment & Plan Note (Signed)
More symptomatic  Not on his PPI Encouraged to restart protonix daily

## 2023-11-28 NOTE — Assessment & Plan Note (Signed)
Bp at goal today on amlodipine, coreg, lisinopril and back on his diuretics for CHF Labs recently checked by cardiology and electrolytes and renal function good BP Readings from Last 3 Encounters:  11/28/23 120/70  11/15/23 (!) 156/95  11/11/23 118/70

## 2023-11-28 NOTE — Assessment & Plan Note (Signed)
Cardiology is adding repatha, LDL rechecked by them and still not at goal

## 2023-11-28 NOTE — Assessment & Plan Note (Signed)
Per Endo, however he hasn't seen them since last July 2024 - at [facility] and he has transportation issues Uncontrolled on basal and mealtime insulin, trulicity, jardiance and metformin UTD on his foot and eye exam He has been working on diet very diligently POC A1C today much improved [facility] [facility] to get established locally (denied referral at [place])

## 2023-11-28 NOTE — Patient Instructions (Signed)
For your upset stomach and belching you need to take your protonix/pantoprazole daily for at least the next 2 weeks.    Lab Results  Component Value Date   HGBA1C 9.8 (H) 08/09/2023

## 2023-12-29 ENCOUNTER — Emergency Department
Admission: EM | Admit: 2023-12-29 | Discharge: 2023-12-29 | Disposition: A | Discharge status: Home / Self Care | Attending: Emergency Medicine | Admitting: Emergency Medicine

## 2023-12-29 ENCOUNTER — Other Ambulatory Visit: Payer: Self-pay

## 2023-12-29 ENCOUNTER — Emergency Department

## 2023-12-29 DIAGNOSIS — R7989 Other specified abnormal findings of blood chemistry: Secondary | ICD-10-CM | POA: Insufficient documentation

## 2023-12-29 DIAGNOSIS — I2510 Atherosclerotic heart disease of native coronary artery without angina pectoris: Secondary | ICD-10-CM | POA: Diagnosis not present

## 2023-12-29 DIAGNOSIS — E119 Type 2 diabetes mellitus without complications: Secondary | ICD-10-CM | POA: Diagnosis not present

## 2023-12-29 DIAGNOSIS — N179 Acute kidney failure, unspecified: Secondary | ICD-10-CM | POA: Diagnosis not present

## 2023-12-29 DIAGNOSIS — R0602 Shortness of breath: Secondary | ICD-10-CM

## 2023-12-29 DIAGNOSIS — J449 Chronic obstructive pulmonary disease, unspecified: Secondary | ICD-10-CM | POA: Diagnosis not present

## 2023-12-29 DIAGNOSIS — I110 Hypertensive heart disease with heart failure: Secondary | ICD-10-CM | POA: Diagnosis not present

## 2023-12-29 DIAGNOSIS — R079 Chest pain, unspecified: Secondary | ICD-10-CM | POA: Diagnosis not present

## 2023-12-29 DIAGNOSIS — R748 Abnormal levels of other serum enzymes: Secondary | ICD-10-CM | POA: Insufficient documentation

## 2023-12-29 DIAGNOSIS — R590 Localized enlarged lymph nodes: Secondary | ICD-10-CM | POA: Diagnosis not present

## 2023-12-29 DIAGNOSIS — I509 Heart failure, unspecified: Secondary | ICD-10-CM | POA: Diagnosis not present

## 2023-12-29 DIAGNOSIS — J441 Chronic obstructive pulmonary disease with (acute) exacerbation: Secondary | ICD-10-CM | POA: Diagnosis not present

## 2023-12-29 DIAGNOSIS — R0789 Other chest pain: Secondary | ICD-10-CM | POA: Diagnosis not present

## 2023-12-29 DIAGNOSIS — J811 Chronic pulmonary edema: Secondary | ICD-10-CM | POA: Diagnosis not present

## 2023-12-29 DIAGNOSIS — K5730 Diverticulosis of large intestine without perforation or abscess without bleeding: Secondary | ICD-10-CM | POA: Diagnosis not present

## 2023-12-29 LAB — CBC
HCT: 43.2 % (ref 39.0–52.0)
Hemoglobin: 14.6 g/dL (ref 13.0–17.0)
MCH: 29.1 pg (ref 26.0–34.0)
MCHC: 33.8 g/dL (ref 30.0–36.0)
MCV: 86.1 fL (ref 80.0–100.0)
Platelets: 231 10*3/uL (ref 150–400)
RBC: 5.02 MIL/uL (ref 4.22–5.81)
RDW: 13.2 % (ref 11.5–15.5)
WBC: 9.2 10*3/uL (ref 4.0–10.5)
nRBC: 0 % (ref 0.0–0.2)

## 2023-12-29 LAB — TROPONIN I (HIGH SENSITIVITY)
Troponin I (High Sensitivity): 21 ng/L — ABNORMAL HIGH (ref <18)
Troponin I (High Sensitivity): 22 ng/L — ABNORMAL HIGH (ref <18)

## 2023-12-29 LAB — HEPATIC FUNCTION PANEL
ALT: 25 U/L (ref 0–44)
AST: 24 U/L (ref 15–41)
Albumin: 3.9 g/dL (ref 3.5–5.0)
Alkaline Phosphatase: 58 U/L (ref 38–126)
Bilirubin, Direct: 0.1 mg/dL (ref 0.0–0.2)
Indirect Bilirubin: 0.7 mg/dL (ref 0.3–0.9)
Total Bilirubin: 0.8 mg/dL (ref 0.0–1.2)
Total Protein: 7.1 g/dL (ref 6.5–8.1)

## 2023-12-29 LAB — BASIC METABOLIC PANEL
Anion gap: 10 (ref 5–15)
BUN: 25 mg/dL — ABNORMAL HIGH (ref 6–20)
CO2: 24 mmol/L (ref 22–32)
Calcium: 8.8 mg/dL — ABNORMAL LOW (ref 8.9–10.3)
Chloride: 105 mmol/L (ref 98–111)
Creatinine, Ser: 1.52 mg/dL — ABNORMAL HIGH (ref 0.61–1.24)
GFR, Estimated: 53 mL/min — ABNORMAL LOW (ref >60)
Glucose, Bld: 142 mg/dL — ABNORMAL HIGH (ref 70–99)
Potassium: 3.8 mmol/L (ref 3.5–5.1)
Sodium: 139 mmol/L (ref 135–145)

## 2023-12-29 LAB — BRAIN NATRIURETIC PEPTIDE: B Natriuretic Peptide: 209 pg/mL — ABNORMAL HIGH (ref 0.0–100.0)

## 2023-12-29 LAB — LIPASE, BLOOD: Lipase: 52 U/L — ABNORMAL HIGH (ref 11–51)

## 2023-12-29 MED ORDER — ACETAMINOPHEN 325 MG PO TABS
650.0000 mg | ORAL_TABLET | Freq: Once | ORAL | Status: AC
  Administered 2023-12-29: 650 mg via ORAL
  Filled 2023-12-29: qty 2

## 2023-12-29 MED ORDER — ASPIRIN 325 MG PO TABS
325.0000 mg | ORAL_TABLET | Freq: Once | ORAL | Status: AC
  Administered 2023-12-29: 325 mg via ORAL
  Filled 2023-12-29: qty 1

## 2023-12-29 MED ORDER — IOHEXOL 350 MG/ML SOLN
100.0000 mL | Freq: Once | INTRAVENOUS | Status: AC | PRN
  Administered 2023-12-29: 100 mL via INTRAVENOUS

## 2023-12-29 MED ORDER — PREDNISONE 20 MG PO TABS
20.0000 mg | ORAL_TABLET | Freq: Once | ORAL | Status: AC
  Administered 2023-12-29: 20 mg via ORAL
  Filled 2023-12-29: qty 1

## 2023-12-29 MED ORDER — FUROSEMIDE 10 MG/ML IJ SOLN
40.0000 mg | Freq: Once | INTRAMUSCULAR | Status: AC
  Administered 2023-12-29: 40 mg via INTRAVENOUS
  Filled 2023-12-29: qty 4

## 2023-12-29 MED ORDER — PREDNISONE 20 MG PO TABS: 20.0000 mg | ORAL_TABLET | Freq: Every day | ORAL | 0 refills | Status: AC

## 2023-12-29 NOTE — Discharge Instructions (Addendum)
 You were seen in the ER today for your chest pain and shortness of breath.  Your testing fortunately did not show an emergency cause for this.  Please follow-up closely with your primary care doctor for further evaluation of your kidney function.  Please also follow-up with cardiology for further evaluation of your chest pain and shortness of breath.  I sent a prescription to request your pharmacy for possible mild COPD flare.  Return to the ER for any new or worsening symptoms.

## 2023-12-29 NOTE — ED Triage Notes (Signed)
 Pt arrived POV for CP that started last night, reports centralized CP that radiates to his back that feels like  pressure. Pt also reports SOB with productive cough, SOB worse with exertion. Hx of COPD, CHF, and is daily smoker. Pt stated he got real diaphoretic when coughing and was struggling to catch his breath, feels that he may have fluid build up. Currently able to speak in complete sentences, sats 92% RA. HTN noted, A&O x4.

## 2023-12-29 NOTE — ED Provider Notes (Signed)
 [facility] Provider Note    Event Date/Time   First MD Initiated Contact with Patient 12/29/23 [phone]     (approximate)   History   Chest Pain   HPI  Cory Rivas is a 58 year old male with history of HTN, DM, CAD, COPD, CHF presenting to the emergency department for evaluation of chest pain.  About an hour prior to presentation patient was at home when he had onset of chest pain described as a pressure in the center of his chest radiating to his back.  Reports associated shortness of breath and cough.  Says he has had some similar symptoms previously, but never this severe.  Used his inhaler with slight improvement, but some ongoing chest pain and shortness of breath.  Reviewed discharge summary from 10/24/2023.  At that time, patient presented with 2 to 3 days of worsening shortness of breath and chest pressure.  Presentation felt to be likely multifactorial from CHF, COPD, body habitus, treated with diuresis, steroids, nebs, antibiotics.     Physical Exam   Triage Vital Signs: ED Triage Vitals  Encounter Vitals Group     BP --      Systolic BP Percentile --      Diastolic BP Percentile --      Pulse Rate 12/29/23 0045 80     Resp 12/29/23 0045 (!) 24     Temp 12/29/23 0045 98.6 F (37 C)     Temp Source 12/29/23 0045 Oral     SpO2 12/29/23 0045 92 %     Weight 12/29/23 0046 220 lb (99.8 kg)     Height 12/29/23 0046 5\' 6"  (1.676 m)     Head Circumference --      Peak Flow --      Pain Score 12/29/23 0044 6     Pain Loc --      Pain Education --      Exclude from Growth Chart --     Most recent vital signs: Vitals:   12/29/23 0046 12/29/23 0243  BP: (!) 170/87 (!) 143/67  Pulse: 80 66  Resp: (!) 22 18  Temp:    SpO2: 94% 94%     General: Awake, interactive  CV:  Regular rate, good peripheral perfusion.  Resp:  Unlabored respirations, lungs clear to auscultation Abd:  Nondistended, soft, nontender to palpation Neuro:  Symmetric  facial movement, fluid speech   ED Results / Procedures / Treatments   Labs (all labs ordered are listed, but only abnormal results are displayed) Labs Reviewed  BASIC METABOLIC PANEL - Abnormal; Notable for the following components:      Result Value   Glucose, Bld 142 (*)    BUN 25 (*)    Creatinine, Ser 1.52 (*)    Calcium 8.8 (*)    GFR, Estimated 53 (*)    All other components within normal limits  LIPASE, BLOOD - Abnormal; Notable for the following components:   Lipase 52 (*)    All other components within normal limits  BRAIN NATRIURETIC PEPTIDE - Abnormal; Notable for the following components:   B Natriuretic Peptide 209.0 (*)    All other components within normal limits  TROPONIN I (HIGH SENSITIVITY) - Abnormal; Notable for the following components:   Troponin I (High Sensitivity) 21 (*)    All other components within normal limits  TROPONIN I (HIGH SENSITIVITY) - Abnormal; Notable for the following components:   Troponin I (High Sensitivity) 22 (*)  All other components within normal limits  CBC  HEPATIC FUNCTION PANEL     EKG EKG independently reviewed interpreted by myself (ER attending) demonstrates:  EKG demonstrates sinus rhythm rate of 78, PR 163, QRS 85, QTc 423, no acute ST changes  RADIOLOGY Imaging independently reviewed and interpreted by myself demonstrates:  CXR without focal consolidation CT dissection protocol without aortic pathology, radiology does note some pulmonary edema  PROCEDURES:  Critical Care performed: No  Procedures   MEDICATIONS ORDERED IN ED: Medications  predniSONE (DELTASONE) tablet 20 mg (has no administration in time range)  iohexol (OMNIPAQUE) 350 MG/ML injection 100 mL (100 mLs Intravenous Contrast Given 12/29/23 0114)  aspirin tablet 325 mg (325 mg Oral Given 12/29/23 0208)  furosemide (LASIX) injection 40 mg (40 mg Intravenous Given by Other 12/29/23 0244)  acetaminophen (TYLENOL) tablet 650 mg (650 mg Oral Given  12/29/23 0243)     IMPRESSION / MDM / ASSESSMENT AND PLAN / ED COURSE  I reviewed the triage vital signs and the nursing notes.  Differential diagnosis includes, but is not limited to, ACS, aortic dissection, pneumonia, pneumothorax, CHF exacerbation, COPD exacerbation  Patient's presentation is most consistent with acute presentation with potential threat to life or bodily function.  58 year old male presenting to the emergency department for evaluation of chest pain and shortness of breath.  Improved on presentation here.  Will obtain labs, CT dissection protocol given reported radiation to back, EKG.  EKG and imaging reassuring, does note some pulmonary edema.  Ordered for a dose of Lasix.  Labs with mildly elevated lipase, not consistent with pancreatitis.  BNP is slightly elevated at 200, improved from recent admission.  Stable minimally elevated troponin, stable on repeat.  Does have slightly increased creatinine from baseline.  Patient reassessed following completion of his workup.  He reports he feels much improved and back to his baseline.  He says that he is able to follow-up closely with his primary care doctor for further evaluation of his renal function.  He feels comfortable with discharge home.  Does feel that this was possibly related to COPD and improved after nebulizer treatment.  Given overall reassuring workup and improvement in symptoms, do think discharge is reasonable with outpatient follow-up with his primary care doctor and cardiology.  Will DC with short course of prednisone for possible COPD flare.  Strict return precautions provided.  Patient discharged in stable condition.    FINAL CLINICAL IMPRESSION(S) / ED DIAGNOSES   Final diagnoses:  Nonspecific chest pain  Shortness of breath  Chronic obstructive pulmonary disease, unspecified COPD type (HCC)  Acute kidney injury (HCC)     Rx / DC Orders   ED Discharge Orders          Ordered    predniSONE  (DELTASONE) 20 MG tablet  Daily with breakfast        12/29/23 0359             Note:  This document was prepared using Dragon voice recognition software and may include unintentional dictation errors.   Trinna Post, MD 12/29/23 0400

## 2024-01-09 ENCOUNTER — Encounter: Payer: Self-pay | Admitting: Nurse Practitioner

## 2024-01-09 ENCOUNTER — Ambulatory Visit: Payer: Self-pay | Admitting: *Deleted

## 2024-01-09 ENCOUNTER — Ambulatory Visit: Admitting: Nurse Practitioner

## 2024-01-09 VITALS — BP 160/94 | HR 81 | Temp 97.6°F | Resp 18 | Ht 66.0 in | Wt 224.6 lb

## 2024-01-09 DIAGNOSIS — M549 Dorsalgia, unspecified: Secondary | ICD-10-CM | POA: Diagnosis not present

## 2024-01-09 DIAGNOSIS — R079 Chest pain, unspecified: Secondary | ICD-10-CM

## 2024-01-09 MED ORDER — TRAMADOL HCL 50 MG PO TABS: 50.0000 mg | ORAL_TABLET | Freq: Three times a day (TID) | ORAL | 0 refills | Status: AC | PRN

## 2024-01-09 MED ORDER — BACLOFEN 10 MG PO TABS: 10.0000 mg | ORAL_TABLET | Freq: Three times a day (TID) | ORAL | 0 refills | Status: DC

## 2024-01-09 NOTE — Progress Notes (Signed)
 BP (!) 160/94 (BP Location: Left Arm)   Pulse 81   Temp 97.6 F (36.4 C)   Resp 18   Ht 5\' 6"  (1.676 m)   Wt 224 lb 9.6 oz (101.9 kg)   SpO2 95%   BMI 36.25 kg/m    Subjective:    Patient ID: Cory Rivas, male    DOB: [birth year], 58 y.o.   MRN: [id number]  HPI: Cory Rivas is a 58 y.o. male  Chief Complaint  Patient presents with   Back Pain    Picked up something heavy on Sunday getting worse pain is a level 10 back middle between shoulder blades and chest    Discussed the use of AI scribe software for clinical note transcription with the patient, who gave verbal consent to proceed.  History of Present Illness Cory Rivas "Cory Rivas" is a 58 year old male with coronary artery disease, congestive heart failure, hypertension, COPD, and diabetes who presents with acute back pain.  He has been experiencing acute back pain since Sunday after lifting a heavy object while working on his car. The pain is described as a throbbing sensation located in the middle of his back, radiating to his chest and causing shortness of breath when it throbs. The pain intensifies when standing, sitting, or turning, and it 'takes his breath away'. He has used a heating pad which provides some relief. He also took an over-the-counter ibuprofen-like medication, which helped slightly. No numbness or tingling is associated with the current back pain.  He has a history of similar pain episodes and has previously used muscle relaxers with relief. He has a history of a seven-disc fusion in his neck and reports that the current pain is below the area of his previous surgery. He is scheduled to see a specialist in June.  He has a significant cardiac history, including coronary artery disease, congestive heart failure, and hypertension. He was seen in the emergency department on December 29, 2023, for chest pain radiating to his back, where a CT angiogram of the chest, abdomen, and pelvis was performed to rule out  dissection, showing no acute aortic syndrome but some trace pulmonary edema.    CT Angio Chest/Abd/Pel for Dissection W and/or Wo Contrast CLINICAL DATA:  Acute aortic syndrome suspected, centralized chest pain radiating to the back. Chest pressure. Shortness of breath and productive cough. History of COPD and CHF. Daily smoker.  EXAM: CT ANGIOGRAPHY CHEST, ABDOMEN AND PELVIS  TECHNIQUE: Non-contrast CT of the chest was initially obtained.  Multidetector CT imaging through the chest, abdomen and pelvis was performed using the standard protocol during bolus administration of intravenous contrast. Multiplanar reconstructed images and MIPs were obtained and reviewed to evaluate the vascular anatomy.  RADIATION DOSE REDUCTION: This exam was performed according to the departmental dose-optimization program which includes automated exposure control, adjustment of the mA and/or kV according to patient size and/or use of iterative reconstruction technique.  CONTRAST:  OMNIPAQUE IOHEXOL 350 MG/ML SOLN  COMPARISON:  Same day chest radiograph; CTA chest 03/09/2023; CT abdomen pelvis 12/08/2018  FINDINGS: CTA CHEST FINDINGS  Cardiovascular: No acute aortic syndrome. Coronary artery calcification. No pericardial effusion.  Mediastinum/Nodes: Trachea and esophagus are unremarkable. No mediastinal hematoma. Enlarged subcarinal node measuring 1.4 cm (series 5/image 68). This has increased from 03/09/2023. Additional increase in multiple subcentimeter mediastinal lymph nodes. For example 9 mm right paratracheal node (5/36).  Lungs/Pleura: Mild interlobular septal thickening and patchy ground-glass opacities greatest in the lower lobes.  Trace bilateral pleural effusions. No pneumothorax.  Musculoskeletal: Partially visualized cervicothoracic fusion extending to T2. The irregular lucency and sclerosis about the T1 and T2 vertebral body hardware seen on I MR images is not seen  on the chest without (series 4) and is likely artifactual. No acute fracture. Fusion multiple anterior osteophytes in the thoracic spine.  Review of the MIP images confirms the above findings.  CTA ABDOMEN AND PELVIS FINDINGS  VASCULAR  Scattered calcified atherosclerotic plaque in the aorta. The mesenteric, renal, and iliac artery branches are widely patent. No aortic aneurysm or dissection.  Review of the MIP images confirms the above findings.  NON-VASCULAR  Hepatobiliary: Cholecystectomy.  No acute abnormality.  Pancreas: Unremarkable.  Spleen: Unremarkable.  Adrenals/Urinary Tract: Normal adrenal glands. No urinary calculi or hydronephrosis. Unremarkable bladder.  Stomach/Bowel: Normal caliber large and small bowel. Colonic diverticulosis without diverticulitis. Stomach and appendix are within normal limits.  Lymphatic: No lymphadenopathy.  Reproductive: Unremarkable.  Other: No free intraperitoneal fluid or air.  Musculoskeletal: No acute fracture.  Review of the MIP images confirms the above findings.  IMPRESSION: 1. No acute aortic syndrome. 2. Pulmonary edema with trace bilateral pleural effusions. 3. Increased size of mediastinal lymph nodes, likely reactive. Continued attention on follow-up. 4. No acute abnormality in the abdomen or pelvis. 5. Aortic Atherosclerosis (ICD10-I70.0).  Electronically Signed   By: Minerva Fester M.D.   On: 12/29/2023 02:12 DG Chest Portable 1 View CLINICAL DATA:  Chest pain, shortness of breath  EXAM: PORTABLE CHEST 1 VIEW  COMPARISON:  10/21/2023  FINDINGS: Heart and mediastinal contours are within normal limits. No focal opacities or effusions. No acute bony abnormality.  IMPRESSION: No active disease.  Electronically Signed   By: Charlett Nose M.D.   On: 12/29/2023 01:07       01/09/2024    1:26 PM 09/16/2023   10:12 AM 09/10/2023   10:27 AM  Depression screen PHQ 2/9  Decreased Interest 0 0 0   Down, Depressed, Hopeless 0 0 0  PHQ - 2 Score 0 0 0  Altered sleeping 2 2 0  Tired, decreased energy 2 2 0  Change in appetite 0 0 0  Feeling bad or failure about yourself  0 0 0  Trouble concentrating 1 1 0  Moving slowly or fidgety/restless 0 0 0  Suicidal thoughts 0 0 0  PHQ-9 Score 5 5 0  Difficult doing work/chores Somewhat difficult Somewhat difficult Not difficult at all    Relevant past medical, surgical, family and social history reviewed and updated as indicated. Interim medical history since our last visit reviewed. Allergies and medications reviewed and updated.  Review of Systems  Ten systems reviewed and is negative except as mentioned in HPI      Objective:    BP (!) 160/94 (BP Location: Left Arm)   Pulse 81   Temp 97.6 F (36.4 C)   Resp 18   Ht 5\' 6"  (1.676 m)   Wt 224 lb 9.6 oz (101.9 kg)   SpO2 95%   BMI 36.25 kg/m    Wt Readings from Last 3 Encounters:  01/09/24 224 lb 9.6 oz (101.9 kg)  12/29/23 220 lb (99.8 kg)  11/28/23 226 lb (102.5 kg)    Physical Exam Physical Exam VITALS: BP- 160/94 GENERAL: Alert, cooperative, well developed, no acute distress. HEENT: Normocephalic, normal oropharynx, moist mucous membranes. CHEST: Clear to auscultation bilaterally, no wheezes, rhonchi, or crackles. CARDIOVASCULAR: Normal heart rate and rhythm, S1 and S2 normal without murmurs.  ABDOMEN: Soft, non-tender, non-distended, without organomegaly, normal bowel sounds. EXTREMITIES: No cyanosis or edema. MSK: tenderness midline thoracic spine NEUROLOGICAL: Cranial nerves grossly intact, moves all extremities without gross motor or sensory deficit.   Results for orders placed or performed during the hospital encounter of 12/29/23  Basic metabolic panel   Collection Time: 12/29/23 12:53 AM  Result Value Ref Range   Sodium 139 135 - 145 mmol/L   Potassium 3.8 3.5 - 5.1 mmol/L   Chloride 105 98 - 111 mmol/L   CO2 24 22 - 32 mmol/L   Glucose, Bld 142 (H) 70 -  99 mg/dL   BUN 25 (H) 6 - 20 mg/dL   Creatinine, Ser 2.13 (H) 0.61 - 1.24 mg/dL   Calcium 8.8 (L) 8.9 - 10.3 mg/dL   GFR, Estimated 53 (L) >60 mL/min   Anion gap 10 5 - 15  CBC   Collection Time: 12/29/23 12:53 AM  Result Value Ref Range   WBC 9.2 4.0 - 10.5 K/uL   RBC 5.02 4.22 - 5.81 MIL/uL   Hemoglobin 14.6 13.0 - 17.0 g/dL   HCT 08.6 57.8 - 46.9 %   MCV 86.1 80.0 - 100.0 fL   MCH 29.1 26.0 - 34.0 pg   MCHC 33.8 30.0 - 36.0 g/dL   RDW 62.9 52.8 - 41.3 %   Platelets 231 150 - 400 K/uL   nRBC 0.0 0.0 - 0.2 %  Hepatic function panel   Collection Time: 12/29/23 12:53 AM  Result Value Ref Range   Total Protein 7.1 6.5 - 8.1 g/dL   Albumin 3.9 3.5 - 5.0 g/dL   AST 24 15 - 41 U/L   ALT 25 0 - 44 U/L   Alkaline Phosphatase 58 38 - 126 U/L   Total Bilirubin 0.8 0.0 - 1.2 mg/dL   Bilirubin, Direct 0.1 0.0 - 0.2 mg/dL   Indirect Bilirubin 0.7 0.3 - 0.9 mg/dL  Lipase, blood   Collection Time: 12/29/23 12:53 AM  Result Value Ref Range   Lipase 52 (H) 11 - 51 U/L  Brain natriuretic peptide   Collection Time: 12/29/23 12:53 AM  Result Value Ref Range   B Natriuretic Peptide 209.0 (H) 0.0 - 100.0 pg/mL  Troponin I (High Sensitivity)   Collection Time: 12/29/23 12:53 AM  Result Value Ref Range   Troponin I (High Sensitivity) 21 (H) <18 ng/L  Troponin I (High Sensitivity)   Collection Time: 12/29/23  2:42 AM  Result Value Ref Range   Troponin I (High Sensitivity) 22 (H) <18 ng/L       Assessment & Plan:   Problem List Items Addressed This Visit       Other   Chest pain   Relevant Orders   EKG 12-Lead   Other Visit Diagnoses       Acute mid back pain    -  Primary   Relevant Medications   traMADol (ULTRAM) 50 MG tablet   baclofen (LIORESAL) 10 MG tablet        Assessment and Plan Assessment & Plan Musculoskeletal Back Pain Acute middle back pain following lifting a heavy object while working on a tractor. Pain is throbbing, radiates to the chest, and causes  shortness of breath. No numbness or tingling. Previous CT angio ruled out acute aortic syndrome. EKG shows sinus rhythm with PACs, no notable ST elevation or depression. Pain is likely musculoskeletal in origin. Tramadol is chosen over NSAIDs due to kidney function. - Prescribe tramadol for pain management. - Prescribe  baclofen as a muscle relaxant. - Advise use of heat therapy for symptomatic relief. - Instruct to seek emergency care if symptoms worsen, particularly chest pain or shortness of breath.  Coronary Artery Disease Significant cardiac history including coronary artery disease. Recent EKG shows sinus rhythm with PACs, no notable ST elevation or depression. - Compare current EKG with previous EKG to monitor for changes. - Advise to seek emergency care if chest pain or shortness of breath worsens.  Hypertension Blood pressure recorded at 160/94. Hypertension is a known condition. - Monitor blood pressure regularly. - Advise to follow up with primary care provider for hypertension management.  Chronic Kidney Disease Chronic kidney disease influences choice of pain management to avoid NSAIDs. - Avoid NSAIDs due to potential adverse effects on kidney function. - Use tramadol for pain management as needed.  Chronic Obstructive Pulmonary Disease (COPD) COPD with no acute respiratory distress during examination. - Monitor respiratory symptoms and advise to seek care if shortness of breath worsens.  Diabetes Mellitus Diabetes mellitus with no specific discussion on current management or complications during this visit. - Continue current diabetes management plan.        Follow up plan: Return if symptoms worsen or fail to improve.

## 2024-01-09 NOTE — Telephone Encounter (Signed)
  Chief Complaint: severe back pain, chest pain breathing in when worsening back pain noted. Possible injury working on tractor few days ago  Symptoms: back pain middle of back between shoulders. Chest pain not constant but reported with pain breathing in when  back pain starts  with movement. Hx CHF and COPD  Frequency: few days ago  Pertinent Negatives: Patient denies chest pain with difficulty breathing at rest.  Disposition: [] ED /[] Urgent Care (no appt availability in office) / [x] Appointment(In office/virtual)/ []  [facility]/ [] Home Care/ [] Refused Recommended Disposition /[]  Mobile Bus/ []  Follow-up with PCP Additional Notes:   Appt scheduled today . Recommended if sx worsen go to ED . Please advise.        Copied from CRM [id number]. Topic: Clinical - Red Word Triage >> Jan 09, 2024 11:58 AM Franchot Heidelberg wrote: Red Word that prompted transfer to Nurse Triage: Difficulty breathing, pain in his chest when he breathes Reason for Disposition  [1] SEVERE back pain (e.g., excruciating, unable to do any normal activities) AND [2] not improved 2 hours after pain medicine  Answer Assessment - Initial Assessment Questions 1. ONSET: "When did the pain begin?"      Few days ago when working on tractor 2. LOCATION: "Where does it hurt?" (upper, mid or lower back)     Mid back between shoulders 3. SEVERITY: "How bad is the pain?"  (e.g., Scale 1-10; mild, moderate, or severe)   - MILD (1-3): Doesn't interfere with normal activities.    - MODERATE (4-7): Interferes with normal activities or awakens from sleep.    - SEVERE (8-10): Excruciating pain, unable to do any normal activities.      10/10  with sitting to standing and certain movements 4. PATTERN: "Is the pain constant?" (e.g., yes, no; constant, intermittent)      Worse with movement  5. RADIATION: "Does the pain shoot into your legs or somewhere else?"     Na  6. CAUSE:  "What do you think is causing the back  pain?"      Not sure  7. BACK OVERUSE:  "Any recent lifting of heavy objects, strenuous work or exercise?"     Working on IT trainer  8. MEDICINES: "What have you taken so far for the pain?" (e.g., nothing, acetaminophen, NSAIDS)     na 9. NEUROLOGIC SYMPTOMS: "Do you have any weakness, numbness, or problems with bowel/bladder control?"     Denies  10. OTHER SYMPTOMS: "Do you have any other symptoms?" (e.g., fever, abdomen pain, burning with urination, blood in urine)       Chest pain and difficulty breathing when pain increases. Hx CHF and COPD  11. PREGNANCY: "Is there any chance you are pregnant?" "When was your last menstrual period?"       na  Protocols used: Back Pain-A-AH

## 2024-01-15 ENCOUNTER — Other Ambulatory Visit: Payer: Self-pay

## 2024-01-15 ENCOUNTER — Emergency Department

## 2024-01-15 ENCOUNTER — Encounter: Payer: Self-pay | Admitting: Emergency Medicine

## 2024-01-15 ENCOUNTER — Emergency Department
Admission: EM | Admit: 2024-01-15 | Discharge: 2024-01-15 | Disposition: A | Discharge status: Home / Self Care | Attending: Emergency Medicine | Admitting: Emergency Medicine

## 2024-01-15 DIAGNOSIS — Z794 Long term (current) use of insulin: Secondary | ICD-10-CM | POA: Diagnosis not present

## 2024-01-15 DIAGNOSIS — Z79899 Other long term (current) drug therapy: Secondary | ICD-10-CM | POA: Diagnosis not present

## 2024-01-15 DIAGNOSIS — R079 Chest pain, unspecified: Secondary | ICD-10-CM | POA: Diagnosis not present

## 2024-01-15 DIAGNOSIS — I110 Hypertensive heart disease with heart failure: Secondary | ICD-10-CM | POA: Insufficient documentation

## 2024-01-15 DIAGNOSIS — E119 Type 2 diabetes mellitus without complications: Secondary | ICD-10-CM | POA: Diagnosis not present

## 2024-01-15 DIAGNOSIS — I509 Heart failure, unspecified: Secondary | ICD-10-CM | POA: Diagnosis not present

## 2024-01-15 DIAGNOSIS — J449 Chronic obstructive pulmonary disease, unspecified: Secondary | ICD-10-CM | POA: Diagnosis not present

## 2024-01-15 DIAGNOSIS — Z7951 Long term (current) use of inhaled steroids: Secondary | ICD-10-CM | POA: Diagnosis not present

## 2024-01-15 DIAGNOSIS — R0781 Pleurodynia: Secondary | ICD-10-CM | POA: Diagnosis not present

## 2024-01-15 DIAGNOSIS — F17200 Nicotine dependence, unspecified, uncomplicated: Secondary | ICD-10-CM | POA: Diagnosis not present

## 2024-01-15 DIAGNOSIS — Z7982 Long term (current) use of aspirin: Secondary | ICD-10-CM | POA: Insufficient documentation

## 2024-01-15 DIAGNOSIS — R0789 Other chest pain: Secondary | ICD-10-CM | POA: Diagnosis present

## 2024-01-15 DIAGNOSIS — Z7984 Long term (current) use of oral hypoglycemic drugs: Secondary | ICD-10-CM | POA: Insufficient documentation

## 2024-01-15 HISTORY — DX: Chronic obstructive pulmonary disease, unspecified: J44.9

## 2024-01-15 LAB — RESP PANEL BY RT-PCR (RSV, FLU A&B, COVID)  RVPGX2
Influenza A by PCR: NEGATIVE
Influenza B by PCR: NEGATIVE
Resp Syncytial Virus by PCR: NEGATIVE
SARS Coronavirus 2 by RT PCR: NEGATIVE

## 2024-01-15 LAB — D-DIMER, QUANTITATIVE: D-Dimer, Quant: 0.29 ug{FEU}/mL (ref 0.00–0.50)

## 2024-01-15 LAB — CBC WITH DIFFERENTIAL/PLATELET
Abs Immature Granulocytes: 0.04 10*3/uL (ref 0.00–0.07)
Basophils Absolute: 0 10*3/uL (ref 0.0–0.1)
Basophils Relative: 0 %
Eosinophils Absolute: 0.2 10*3/uL (ref 0.0–0.5)
Eosinophils Relative: 2 %
HCT: 42.8 % (ref 39.0–52.0)
Hemoglobin: 14.4 g/dL (ref 13.0–17.0)
Immature Granulocytes: 0 %
Lymphocytes Relative: 21 %
Lymphs Abs: 2.3 10*3/uL (ref 0.7–4.0)
MCH: 29.1 pg (ref 26.0–34.0)
MCHC: 33.6 g/dL (ref 30.0–36.0)
MCV: 86.6 fL (ref 80.0–100.0)
Monocytes Absolute: 0.7 10*3/uL (ref 0.1–1.0)
Monocytes Relative: 6 %
Neutro Abs: 7.7 10*3/uL (ref 1.7–7.7)
Neutrophils Relative %: 71 %
Platelets: 203 10*3/uL (ref 150–400)
RBC: 4.94 MIL/uL (ref 4.22–5.81)
RDW: 13.3 % (ref 11.5–15.5)
WBC: 11 10*3/uL — ABNORMAL HIGH (ref 4.0–10.5)
nRBC: 0 % (ref 0.0–0.2)

## 2024-01-15 LAB — COMPREHENSIVE METABOLIC PANEL WITH GFR
ALT: 16 U/L (ref 0–44)
AST: 20 U/L (ref 15–41)
Albumin: 3.7 g/dL (ref 3.5–5.0)
Alkaline Phosphatase: 51 U/L (ref 38–126)
Anion gap: 10 (ref 5–15)
BUN: 17 mg/dL (ref 6–20)
CO2: 24 mmol/L (ref 22–32)
Calcium: 8.6 mg/dL — ABNORMAL LOW (ref 8.9–10.3)
Chloride: 103 mmol/L (ref 98–111)
Creatinine, Ser: 1.31 mg/dL — ABNORMAL HIGH (ref 0.61–1.24)
GFR, Estimated: >60 mL/min (ref >60)
Glucose, Bld: 178 mg/dL — ABNORMAL HIGH (ref 70–99)
Potassium: 3.8 mmol/L (ref 3.5–5.1)
Sodium: 137 mmol/L (ref 135–145)
Total Bilirubin: 0.5 mg/dL (ref 0.0–1.2)
Total Protein: 6.8 g/dL (ref 6.5–8.1)

## 2024-01-15 LAB — TROPONIN I (HIGH SENSITIVITY)
Troponin I (High Sensitivity): 22 ng/L — ABNORMAL HIGH (ref <18)
Troponin I (High Sensitivity): 25 ng/L — ABNORMAL HIGH

## 2024-01-15 LAB — BRAIN NATRIURETIC PEPTIDE: B Natriuretic Peptide: 117.8 pg/mL — ABNORMAL HIGH (ref 0.0–100.0)

## 2024-01-15 MED ORDER — IPRATROPIUM-ALBUTEROL 0.5-2.5 (3) MG/3ML IN SOLN
3.0000 mL | Freq: Once | RESPIRATORY_TRACT | Status: AC
  Administered 2024-01-15: 3 mL via RESPIRATORY_TRACT
  Filled 2024-01-15: qty 3

## 2024-01-15 MED ORDER — KETOROLAC TROMETHAMINE 30 MG/ML IJ SOLN
30.0000 mg | Freq: Once | INTRAMUSCULAR | Status: AC
  Administered 2024-01-15: 30 mg via INTRAVENOUS
  Filled 2024-01-15: qty 1

## 2024-01-15 NOTE — ED Provider Notes (Signed)
 [facility] Provider Note    Event Date/Time   First MD Initiated Contact with Patient 01/15/24 0602     (approximate)   History   Chest Pain   HPI  Cory Rivas is a 58 y.o. male with history of hypertension, diabetes, hyperlipidemia, CHF, COPD not on home oxygen who presents to the emergency department with 2 to 3 days of diffuse chest tightness worse with deep inspiration and better with sitting upright.  Symptoms also better when he uses his breathing treatments.  He denies fever but has had productive cough.  No lower extremity swelling or discomfort.  No prior history of PE or DVT.   History provided by patient.    Past Medical History:  Diagnosis Date   Bulging of cervical intervertebral disc    CHF (congestive heart failure) (HCC)    COPD (chronic obstructive pulmonary disease) (HCC)    Diabetes mellitus without complication (HCC)    Gout    High cholesterol    Hypertension    Stress due to family tension 04/15/2019    Past Surgical History:  Procedure Laterality Date   BACK SURGERY     CARDIAC CATHETERIZATION     CHOLECYSTECTOMY     COLONOSCOPY WITH PROPOFOL N/A 03/10/2020   Procedure: COLONOSCOPY WITH PROPOFOL;  Surgeon: Midge Minium, MD;  Location: [facility] ENDOSCOPY;  Service: Endoscopy;  Laterality: N/A;   COLONOSCOPY WITH PROPOFOL N/A 09/25/2023   Procedure: COLONOSCOPY WITH PROPOFOL;  Surgeon: Midge Minium, MD;  Location: [facility] ENDOSCOPY;  Service: Endoscopy;  Laterality: N/A;   CORONARY PRESSURE/FFR STUDY N/A 05/29/2023   Procedure: CORONARY PRESSURE/FFR STUDY;  Surgeon: Yvonne Kendall, MD;  Location: [facility] INVASIVE CV LAB;  Service: Cardiovascular;  Laterality: N/A;   POLYPECTOMY  09/25/2023   Procedure: POLYPECTOMY;  Surgeon: Midge Minium, MD;  Location: [facility] ENDOSCOPY;  Service: Endoscopy;;   RIGHT/LEFT HEART CATH AND CORONARY ANGIOGRAPHY N/A 05/29/2023   Procedure: RIGHT/LEFT HEART CATH AND CORONARY ANGIOGRAPHY;  Surgeon:  Yvonne Kendall, MD;  Location: [facility] INVASIVE CV LAB;  Service: Cardiovascular;  Laterality: N/A;   RIGHT/LEFT HEART CATH AND CORONARY ANGIOGRAPHY Bilateral 10/30/2023   Procedure: RIGHT/LEFT HEART CATH AND CORONARY ANGIOGRAPHY;  Surgeon: Yvonne Kendall, MD;  Location: [facility] INVASIVE CV LAB;  Service: Cardiovascular;  Laterality: Bilateral;    MEDICATIONS:  Prior to Admission medications   Medication Sig Start Date End Date Taking? Authorizing Provider  allopurinol (ZYLOPRIM) 300 MG tablet TAKE ONE TABLET BY MOUTH DAILY WITH 100 MG TABLET. FOR A TOTAL OF 400 MG DAILY FOR GOUT 11/11/23   Danelle Berry, PA-C  amLODipine (NORVASC) 10 MG tablet Take 1 tablet (10 mg total) by mouth daily. 10/30/23 10/29/24  End, Cristal Deer, MD  aspirin EC 81 MG tablet Take 1 tablet (81 mg total) by mouth daily. Swallow whole. 11/11/23   Danelle Berry, PA-C  baclofen (LIORESAL) 10 MG tablet Take 1 tablet (10 mg total) by mouth 3 (three) times daily. 01/09/24   Berniece Salines, FNP  budesonide-formoterol (SYMBICORT) 160-4.5 MCG/ACT inhaler Inhale 2 puffs into the lungs 2 (two) times daily. 11/05/23   Raechel Chute, MD  carvedilol (COREG) 25 MG tablet Take 1.5 tablets (37.5 mg total) by mouth 2 (two) times daily. 11/15/23 02/13/24  Sondra Barges, PA-C  Continuous Glucose Receiver (DEXCOM G7 RECEIVER) DEVI Dispense one reciever to be used with 10 d devices for IDDM 10/31/23   Danelle Berry, PA-C  Continuous Glucose Receiver (FREESTYLE LIBRE 3 READER) DEVI Use with libre 3 device as  directed 11/04/23   Danelle Berry, PA-C  Continuous Glucose Sensor (FREESTYLE LIBRE 3 SENSOR) MISC 1 Device by Does not apply route every 14 (fourteen) days. 11/04/23   Danelle Berry, PA-C  DULoxetine (CYMBALTA) 30 MG capsule Take 60 mg (2 capsules) poqam and 30 mg (1 capsule) poqpm daily 11/11/23   Danelle Berry, PA-C  empagliflozin (JARDIANCE) 10 MG TABS tablet Take 1 tablet (10 mg total) by mouth daily. 11/11/23   Danelle Berry, PA-C  Evolocumab (REPATHA  SURECLICK) 140 MG/ML SOAJ Inject 140 mg into the skin every 14 (fourteen) days. 11/27/23   Sondra Barges, PA-C  ezetimibe (ZETIA) 10 MG tablet Take 1 tablet (10 mg total) by mouth daily. 11/11/23   Danelle Berry, PA-C  fluticasone (FLONASE) 50 MCG/ACT nasal spray Place 1 spray into both nostrils daily. 01/23/23 01/23/24  Danelle Berry, PA-C  insulin glargine (LANTUS SOLOSTAR) 100 UNIT/ML Solostar Pen Inject 20 Units into the skin at bedtime. 11/01/20   [provider]  insulin lispro (HUMALOG) 100 UNIT/ML KwikPen Inject 8 Units into the skin 3 (three) times daily. Sliding scale up to 45 units per injection 11/08/20   [provider]  ipratropium-albuterol (DUONEB) 0.5-2.5 (3) MG/3ML SOLN Take 3 mLs by nebulization every 6 (six) hours as needed (SOB wheeze cough). 07/23/22   Danelle Berry, PA-C  levocetirizine (XYZAL) 5 MG tablet TAKE ONE TABLET BY MOUTH EVERY EVENING 01/11/23   Danelle Berry, PA-C  lisinopril (ZESTRIL) 40 MG tablet Take 1 tablet (40 mg total) by mouth daily. 11/04/23   Danelle Berry, PA-C  metFORMIN (GLUCOPHAGE-XR) 500 MG 24 hr tablet Take 1,000 mg by mouth 2 (two) times daily. 10/15/23   [provider]  pantoprazole (PROTONIX) 40 MG tablet Take 1 tablet (40 mg total) by mouth daily. 09/16/23   Berniece Salines, FNP  pregabalin (LYRICA) 50 MG capsule Take 1 capsule (50 mg total) by mouth 2 (two) times daily. 11/11/23   Danelle Berry, PA-C  rosuvastatin (CRESTOR) 40 MG tablet Take 1 tablet (40 mg total) by mouth daily. 11/11/23   Danelle Berry, PA-C  Tiotropium Bromide Monohydrate (SPIRIVA RESPIMAT) 2.5 MCG/ACT AERS Inhale 2 puffs into the lungs daily. 11/05/23   Raechel Chute, MD  Torsemide 40 MG TABS Take 40 mg by mouth daily. 11/04/23   Danelle Berry, PA-C  TRULICITY 3 MG/0.5ML SOPN Inject 3 mg into the skin once a week. SATURDAYS 03/26/22   [provider]  VENTOLIN HFA 108 (90 Base) MCG/ACT inhaler INHALE TWO PUFFS INTO THE LUNGS EVERY SIX HOURS AS NEEDED FOR WHEEZING  OR SHORTNESS OF BREATH 04/26/23   Danelle Berry, PA-C    Physical Exam   Triage Vital Signs: ED Triage Vitals  Encounter Vitals Group     BP 01/15/24 0246 (!) 186/97     Systolic BP Percentile --      Diastolic BP Percentile --      Pulse Rate 01/15/24 0246 82     Resp 01/15/24 0246 20     Temp 01/15/24 0246 97.7 F (36.5 C)     Temp Source 01/15/24 0246 Oral     SpO2 01/15/24 0246 95 %     Weight 01/15/24 0245 230 lb (104.3 kg)     Height 01/15/24 0245 5\' 6"  (1.676 m)     Head Circumference --      Peak Flow --      Pain Score 01/15/24 0244 8     Pain Loc --      Pain Education --  Exclude from Growth Chart --     Most recent vital signs: Vitals:   01/15/24 0451 01/15/24 0630  BP: (!) 151/94 (!) 176/106  Pulse: 75 79  Resp: 18 20  Temp: 97.9 F (36.6 C)   SpO2: 96% 96%    CONSTITUTIONAL: Alert, responds appropriately to questions.  Appears somewhat uncomfortable HEAD: Normocephalic, atraumatic EYES: Conjunctivae clear, pupils appear equal, sclera nonicteric ENT: normal nose; moist mucous membranes NECK: Supple, normal ROM CARD: RRR; S1 and S2 appreciated RESP: Normal chest excursion without splinting or tachypnea; breath sounds clear and equal bilaterally; no wheezes, no rhonchi, no rales, no hypoxia or respiratory distress, speaking full sentences, slightly diminished aeration at his bases bilaterally ABD/GI: Non-distended; soft, non-tender, no rebound, no guarding, no peritoneal signs BACK: The back appears normal EXT: Normal ROM in all joints; no deformity noted, no edema, no calf tenderness or calf swelling SKIN: Normal color for age and race; warm; no rash on exposed skin NEURO: Moves all extremities equally, normal speech PSYCH: The patient's mood and manner are appropriate.   ED Results / Procedures / Treatments   LABS: (all labs ordered are listed, but only abnormal results are displayed) Labs Reviewed  CBC WITH DIFFERENTIAL/PLATELET - Abnormal;  Notable for the following components:      Result Value   WBC 11.0 (*)    All other components within normal limits  COMPREHENSIVE METABOLIC PANEL WITH GFR - Abnormal; Notable for the following components:   Glucose, Bld 178 (*)    Creatinine, Ser 1.31 (*)    Calcium 8.6 (*)    All other components within normal limits  BRAIN NATRIURETIC PEPTIDE - Abnormal; Notable for the following components:   B Natriuretic Peptide 117.8 (*)    All other components within normal limits  TROPONIN I (HIGH SENSITIVITY) - Abnormal; Notable for the following components:   Troponin I (High Sensitivity) 22 (*)    All other components within normal limits  TROPONIN I (HIGH SENSITIVITY) - Abnormal; Notable for the following components:   Troponin I (High Sensitivity) 25 (*)    All other components within normal limits  RESP PANEL BY RT-PCR (RSV, FLU A&B, COVID)  [id number]  D-DIMER, QUANTITATIVE     EKG:  EKG Interpretation Date/Time:  Wednesday January 15 2024 02:52:05 EDT Ventricular Rate:  79 PR Interval:  160 QRS Duration:  78 QT Interval:  382 QTC Calculation: 438 R Axis:   52  Text Interpretation: Normal sinus rhythm ST & T wave abnormality, consider inferolateral ischemia Abnormal ECG When compared with ECG of 29-Dec-2023 00:51, PREVIOUS ECG IS PRESENT No significant change since last tracing Confirmed by Rochele Raring [id number]) on 01/15/2024 6:01:58 AM         RADIOLOGY: My personal review and interpretation of imaging: Chest x-ray clear.  I have personally reviewed all radiology reports.   DG Chest 2 View Result Date: 01/15/2024 CLINICAL DATA:  Chest pain EXAM: CHEST - 2 VIEW COMPARISON:  Radiograph and CT 12/29/2023 FINDINGS: Stable cardiomediastinal silhouette. No focal consolidation, pleural effusion, or pneumothorax. No displaced rib fractures. IMPRESSION: No active cardiopulmonary disease. Electronically Signed   By: Minerva Fester M.D.   On: 01/15/2024 03:12     PROCEDURES:  Critical  Care performed: No      .1-3 Lead EKG Interpretation  Performed by: Ainsley Sanguinetti, Layla Maw, DO Authorized by: Genavieve Mangiapane, Layla Maw, DO     Interpretation: normal     ECG rate:  79   ECG rate assessment: normal  Rhythm: sinus rhythm     Ectopy: none     Conduction: normal       IMPRESSION / MDM / ASSESSMENT AND PLAN / ED COURSE  I reviewed the triage vital signs and the nursing notes.    Patient here with pleuritic chest pain.  Multiple risk factors for ACS.  The patient is on the cardiac monitor to evaluate for evidence of arrhythmia and/or significant heart rate changes.   DIFFERENTIAL DIAGNOSIS (includes but not limited to):   ACS, PE, CHF, COPD, pleurisy, pneumonia, viral URI, pneumothorax, chest wall pain   Patient's presentation is most consistent with acute presentation with potential threat to life or bodily function.   PLAN: EKG shows no new ischemic change compared to prior.  Will repeat.  Troponin x 2 minimally elevated but flat.  While patient certainly has multiple risk factors for ACS, his symptoms today seem atypical.  Will obtain D-dimer, COVID and flu swab.  Chest x-ray reviewed and interpreted by myself and the radiologist and is clear.  BNP is only minimally elevated.  Clinically he does not look volume overloaded.  Will give Toradol, breathing treatment for symptomatic relief and reassess.   MEDICATIONS GIVEN IN ED: Medications  ketorolac (TORADOL) 30 MG/ML injection 30 mg (30 mg Intravenous Given 01/15/24 0634)  ipratropium-albuterol (DUONEB) 0.5-2.5 (3) MG/3ML nebulizer solution 3 mL (3 mLs Nebulization Given 01/15/24 1610)     ED COURSE: Repeat EKG again shows no change.  D-dimer, viral swab pending.  Signed out the oncoming ED physician.   CONSULTS: Pending further workup.   OUTSIDE RECORDS REVIEWED: Reviewed last PCP notes.       FINAL CLINICAL IMPRESSION(S) / ED DIAGNOSES   Final diagnoses:  Pleuritic chest pain     Rx / DC Orders   ED  Discharge Orders     None        Note:  This document was prepared using Dragon voice recognition software and may include unintentional dictation errors.   Kelle Ruppert, Layla Maw, DO 01/15/24 [phone]

## 2024-01-15 NOTE — ED Triage Notes (Signed)
 Patient ambulatory to triage with steady gait, without difficulty or distress noted; pt reports for last 2 days having mid upper CP radiating into back accomp by [facility]; st hx CHF; +smoker

## 2024-01-15 NOTE — Discharge Instructions (Signed)
 Valuation in the emergency department is overall reassuring.  I do suspect you have a mild flare of your COPD, I recommend you continue to use your home nebulizer machine as needed.  Please do follow-up closely with your primary care doctor, and return to the emergency department with any new or worsening symptoms.  You can use Tylenol as needed for any ongoing mild discomfort.

## 2024-01-15 NOTE — ED Provider Notes (Signed)
 Signout from Dr. Elesa Massed. See updated ED course below. Physical Exam  BP (!) 168/92   Pulse 79   Temp 97.9 F (36.6 C) (Oral)   Resp (!) 23   Ht 5\' 6"  (1.676 m)   Wt 104.3 kg   SpO2 98%   BMI 37.12 kg/m   Physical Exam See ED course. Procedures  Procedures  ED Course / MDM   Clinical Course as of 01/15/24 0801  Wed Jan 15, 2024  0713 S/o from Dr. Elesa Massed: 58 year old male PMH CHF, COPD presents for evaluation of typical chest discomfort, pleuritic discomfort -Initial workup overall unremarkable, troponin overall at baseline -Chest x-ray, EKG unremarkable - getting toradol, neb tx  TO DO: - f/u Ddimer - f/u viral swab - re-eval  If unremarkable workup, feeling better, stable for DC. Do not suspect ACS.  [MM]  0746 Dimer wnl Viral swab neg [MM]  0759 Patient reevaluated, satting 98-100% on room air with normal work of breathing, no tachypnea.  Physical exam with clear lungs bilaterally, no wheezing.  Does confirm that he felt a little chest tightness similar to his prior exacerbations of COPD, also endorses mild increased sputum production over the past couple days.  Notes his pain is primarily been with his mild cough, not having exertional chest pain or other concerning anginal symptoms.  I agree that he is very low risk for ACS and this is more likely a musculoskeletal discomfort and also due to a mild COPD flare.  Did offer a course of steroids though patient prefers to defer at this time which I believe is reasonable.  Plan for close PMD follow-up.  ED return precautions in place.  Patient agrees with plan.  Very well-appearing with stable vital signs, will proceed with discharge home per signout plan. [MM]    Clinical Course User Index [MM] Marinell Blight, MD   Medical Decision Making Amount and/or Complexity of Data Reviewed Labs: ordered.  Risk Prescription drug management.          Marinell Blight, MD 01/15/24 [phone]

## 2024-01-16 ENCOUNTER — Telehealth: Payer: Self-pay

## 2024-01-16 NOTE — Transitions of Care (Post Inpatient/ED Visit) (Signed)
   01/16/2024  Name: Cory Rivas MRN: [id number] DOB: [birth year]  Today's TOC FU Call Status: Today's TOC FU Call Status:: Unsuccessful Call (1st Attempt) Unsuccessful Call (1st Attempt) Date: 01/16/24  Attempted to reach the patient regarding the most recent Inpatient/ED visit.  Follow Up Plan: Additional outreach attempts will be made to reach the patient to complete the Transitions of Care (Post Inpatient/ED visit) call.   Signature  Karena Addison, LPN [facility] Nurse Health Advisor Direct Dial [phone]

## 2024-01-20 ENCOUNTER — Emergency Department

## 2024-01-20 ENCOUNTER — Inpatient Hospital Stay
Admission: EM | Admit: 2024-01-20 | Discharge: 2024-01-23 | DRG: 291 | Disposition: A | Discharge status: Home / Self Care | Attending: Internal Medicine | Admitting: Internal Medicine

## 2024-01-20 ENCOUNTER — Other Ambulatory Visit: Payer: Self-pay

## 2024-01-20 ENCOUNTER — Encounter: Payer: Self-pay | Admitting: Emergency Medicine

## 2024-01-20 DIAGNOSIS — Z5986 Financial insecurity: Secondary | ICD-10-CM

## 2024-01-20 DIAGNOSIS — N1831 Chronic kidney disease, stage 3a: Secondary | ICD-10-CM | POA: Diagnosis not present

## 2024-01-20 DIAGNOSIS — Z91199 Patient's noncompliance with other medical treatment and regimen due to unspecified reason: Secondary | ICD-10-CM

## 2024-01-20 DIAGNOSIS — I272 Pulmonary hypertension, unspecified: Secondary | ICD-10-CM | POA: Diagnosis not present

## 2024-01-20 DIAGNOSIS — I5033 Acute on chronic diastolic (congestive) heart failure: Secondary | ICD-10-CM | POA: Diagnosis not present

## 2024-01-20 DIAGNOSIS — J449 Chronic obstructive pulmonary disease, unspecified: Secondary | ICD-10-CM | POA: Diagnosis not present

## 2024-01-20 DIAGNOSIS — E1165 Type 2 diabetes mellitus with hyperglycemia: Secondary | ICD-10-CM | POA: Diagnosis present

## 2024-01-20 DIAGNOSIS — I2510 Atherosclerotic heart disease of native coronary artery without angina pectoris: Secondary | ICD-10-CM | POA: Diagnosis present

## 2024-01-20 DIAGNOSIS — Z1152 Encounter for screening for COVID-19: Secondary | ICD-10-CM

## 2024-01-20 DIAGNOSIS — R0789 Other chest pain: Secondary | ICD-10-CM | POA: Diagnosis not present

## 2024-01-20 DIAGNOSIS — J441 Chronic obstructive pulmonary disease with (acute) exacerbation: Principal | ICD-10-CM | POA: Diagnosis present

## 2024-01-20 DIAGNOSIS — Z7985 Long-term (current) use of injectable non-insulin antidiabetic drugs: Secondary | ICD-10-CM

## 2024-01-20 DIAGNOSIS — Z8249 Family history of ischemic heart disease and other diseases of the circulatory system: Secondary | ICD-10-CM

## 2024-01-20 DIAGNOSIS — Z79899 Other long term (current) drug therapy: Secondary | ICD-10-CM

## 2024-01-20 DIAGNOSIS — Z604 Social exclusion and rejection: Secondary | ICD-10-CM | POA: Diagnosis present

## 2024-01-20 DIAGNOSIS — R0781 Pleurodynia: Secondary | ICD-10-CM | POA: Diagnosis present

## 2024-01-20 DIAGNOSIS — E875 Hyperkalemia: Secondary | ICD-10-CM | POA: Diagnosis present

## 2024-01-20 DIAGNOSIS — Z6841 Body Mass Index (BMI) 40.0 and over, adult: Secondary | ICD-10-CM

## 2024-01-20 DIAGNOSIS — I130 Hypertensive heart and chronic kidney disease with heart failure and stage 1 through stage 4 chronic kidney disease, or unspecified chronic kidney disease: Secondary | ICD-10-CM | POA: Diagnosis not present

## 2024-01-20 DIAGNOSIS — Z7951 Long term (current) use of inhaled steroids: Secondary | ICD-10-CM

## 2024-01-20 DIAGNOSIS — R918 Other nonspecific abnormal finding of lung field: Secondary | ICD-10-CM | POA: Diagnosis not present

## 2024-01-20 DIAGNOSIS — N179 Acute kidney failure, unspecified: Secondary | ICD-10-CM | POA: Diagnosis present

## 2024-01-20 DIAGNOSIS — I160 Hypertensive urgency: Secondary | ICD-10-CM | POA: Diagnosis not present

## 2024-01-20 DIAGNOSIS — J9601 Acute respiratory failure with hypoxia: Secondary | ICD-10-CM | POA: Diagnosis present

## 2024-01-20 DIAGNOSIS — I10 Essential (primary) hypertension: Secondary | ICD-10-CM | POA: Diagnosis not present

## 2024-01-20 DIAGNOSIS — Z7984 Long term (current) use of oral hypoglycemic drugs: Secondary | ICD-10-CM

## 2024-01-20 DIAGNOSIS — Z83438 Family history of other disorder of lipoprotein metabolism and other lipidemia: Secondary | ICD-10-CM

## 2024-01-20 DIAGNOSIS — Z794 Long term (current) use of insulin: Secondary | ICD-10-CM

## 2024-01-20 DIAGNOSIS — Z885 Allergy status to narcotic agent status: Secondary | ICD-10-CM

## 2024-01-20 DIAGNOSIS — Z833 Family history of diabetes mellitus: Secondary | ICD-10-CM

## 2024-01-20 DIAGNOSIS — M1A00X0 Idiopathic chronic gout, unspecified site, without tophus (tophi): Secondary | ICD-10-CM | POA: Diagnosis present

## 2024-01-20 DIAGNOSIS — E1122 Type 2 diabetes mellitus with diabetic chronic kidney disease: Secondary | ICD-10-CM | POA: Diagnosis not present

## 2024-01-20 DIAGNOSIS — I2489 Other forms of acute ischemic heart disease: Secondary | ICD-10-CM | POA: Diagnosis present

## 2024-01-20 DIAGNOSIS — Z888 Allergy status to other drugs, medicaments and biological substances status: Secondary | ICD-10-CM

## 2024-01-20 DIAGNOSIS — G4733 Obstructive sleep apnea (adult) (pediatric): Secondary | ICD-10-CM | POA: Diagnosis present

## 2024-01-20 DIAGNOSIS — I358 Other nonrheumatic aortic valve disorders: Secondary | ICD-10-CM | POA: Diagnosis present

## 2024-01-20 DIAGNOSIS — J810 Acute pulmonary edema: Secondary | ICD-10-CM | POA: Diagnosis not present

## 2024-01-20 DIAGNOSIS — I509 Heart failure, unspecified: Secondary | ICD-10-CM | POA: Insufficient documentation

## 2024-01-20 DIAGNOSIS — R059 Cough, unspecified: Secondary | ICD-10-CM | POA: Diagnosis not present

## 2024-01-20 DIAGNOSIS — E1129 Type 2 diabetes mellitus with other diabetic kidney complication: Secondary | ICD-10-CM | POA: Diagnosis present

## 2024-01-20 DIAGNOSIS — Z9109 Other allergy status, other than to drugs and biological substances: Secondary | ICD-10-CM

## 2024-01-20 DIAGNOSIS — I5032 Chronic diastolic (congestive) heart failure: Secondary | ICD-10-CM | POA: Diagnosis present

## 2024-01-20 DIAGNOSIS — Z801 Family history of malignant neoplasm of trachea, bronchus and lung: Secondary | ICD-10-CM

## 2024-01-20 DIAGNOSIS — E7800 Pure hypercholesterolemia, unspecified: Secondary | ICD-10-CM | POA: Diagnosis present

## 2024-01-20 DIAGNOSIS — Z555 Less than a high school diploma: Secondary | ICD-10-CM

## 2024-01-20 DIAGNOSIS — Z7982 Long term (current) use of aspirin: Secondary | ICD-10-CM

## 2024-01-20 DIAGNOSIS — Z823 Family history of stroke: Secondary | ICD-10-CM

## 2024-01-20 DIAGNOSIS — F17210 Nicotine dependence, cigarettes, uncomplicated: Secondary | ICD-10-CM | POA: Diagnosis present

## 2024-01-20 DIAGNOSIS — R0602 Shortness of breath: Secondary | ICD-10-CM | POA: Diagnosis not present

## 2024-01-20 DIAGNOSIS — F17200 Nicotine dependence, unspecified, uncomplicated: Secondary | ICD-10-CM | POA: Diagnosis present

## 2024-01-20 DIAGNOSIS — J9809 Other diseases of bronchus, not elsewhere classified: Secondary | ICD-10-CM | POA: Diagnosis present

## 2024-01-20 LAB — TROPONIN I (HIGH SENSITIVITY)
Troponin I (High Sensitivity): 21 ng/L — ABNORMAL HIGH (ref <18)
Troponin I (High Sensitivity): 21 ng/L — ABNORMAL HIGH (ref <18)

## 2024-01-20 LAB — URIC ACID: Uric Acid, Serum: 9.3 mg/dL — ABNORMAL HIGH (ref 3.7–8.6)

## 2024-01-20 LAB — CBC WITH DIFFERENTIAL/PLATELET
Abs Immature Granulocytes: 0.04 10*3/uL (ref 0.00–0.07)
Basophils Absolute: 0 10*3/uL (ref 0.0–0.1)
Basophils Relative: 0 %
Eosinophils Absolute: 0.2 10*3/uL (ref 0.0–0.5)
Eosinophils Relative: 2 %
HCT: 42.5 % (ref 39.0–52.0)
Hemoglobin: 13.9 g/dL (ref 13.0–17.0)
Immature Granulocytes: 0 %
Lymphocytes Relative: 19 %
Lymphs Abs: 1.8 10*3/uL (ref 0.7–4.0)
MCH: 28.8 pg (ref 26.0–34.0)
MCHC: 32.7 g/dL (ref 30.0–36.0)
MCV: 88.2 fL (ref 80.0–100.0)
Monocytes Absolute: 0.6 10*3/uL (ref 0.1–1.0)
Monocytes Relative: 6 %
Neutro Abs: 7 10*3/uL (ref 1.7–7.7)
Neutrophils Relative %: 73 %
Platelets: 195 10*3/uL (ref 150–400)
RBC: 4.82 MIL/uL (ref 4.22–5.81)
RDW: 13.1 % (ref 11.5–15.5)
WBC: 9.7 10*3/uL (ref 4.0–10.5)
nRBC: 0 % (ref 0.0–0.2)

## 2024-01-20 LAB — BASIC METABOLIC PANEL WITH GFR
Anion gap: 10 (ref 5–15)
BUN: 12 mg/dL (ref 6–20)
CO2: 25 mmol/L (ref 22–32)
Calcium: 8.2 mg/dL — ABNORMAL LOW (ref 8.9–10.3)
Chloride: 104 mmol/L (ref 98–111)
Creatinine, Ser: 1.45 mg/dL — ABNORMAL HIGH (ref 0.61–1.24)
GFR, Estimated: 57 mL/min — ABNORMAL LOW (ref >60)
Glucose, Bld: 157 mg/dL — ABNORMAL HIGH (ref 70–99)
Potassium: 3.7 mmol/L (ref 3.5–5.1)
Sodium: 139 mmol/L (ref 135–145)

## 2024-01-20 LAB — RESP PANEL BY RT-PCR (RSV, FLU A&B, COVID)  RVPGX2
Influenza A by PCR: NEGATIVE
Influenza B by PCR: NEGATIVE
Resp Syncytial Virus by PCR: NEGATIVE
SARS Coronavirus 2 by RT PCR: NEGATIVE

## 2024-01-20 LAB — CBG MONITORING, ED
Glucose-Capillary: 283 mg/dL — ABNORMAL HIGH (ref 70–99)
Glucose-Capillary: 201 mg/dL — ABNORMAL HIGH (ref 70–99)
Glucose-Capillary: 172 mg/dL — ABNORMAL HIGH (ref 70–99)

## 2024-01-20 LAB — CK: Total CK: 454 U/L — ABNORMAL HIGH (ref 49–397)

## 2024-01-20 LAB — BRAIN NATRIURETIC PEPTIDE: B Natriuretic Peptide: 152.3 pg/mL — ABNORMAL HIGH (ref 0.0–100.0)

## 2024-01-20 MED ORDER — FUROSEMIDE 10 MG/ML IJ SOLN
40.0000 mg | Freq: Two times a day (BID) | INTRAMUSCULAR | Status: DC
  Administered 2024-01-20 – 2024-01-21 (×2): 40 mg via INTRAVENOUS
  Filled 2024-01-20 (×2): qty 4

## 2024-01-20 MED ORDER — ASPIRIN 81 MG PO TBEC
81.0000 mg | DELAYED_RELEASE_TABLET | Freq: Every day | ORAL | Status: DC
  Administered 2024-01-20 – 2024-01-23 (×4): 81 mg via ORAL
  Filled 2024-01-20 (×4): qty 1

## 2024-01-20 MED ORDER — SODIUM CHLORIDE 0.9% FLUSH
3.0000 mL | Freq: Two times a day (BID) | INTRAVENOUS | Status: DC
  Administered 2024-01-20 – 2024-01-23 (×6): 3 mL via INTRAVENOUS

## 2024-01-20 MED ORDER — INSULIN ASPART 100 UNIT/ML IJ SOLN: 0.0000 [IU] | Freq: Every day | INTRAMUSCULAR | Status: DC

## 2024-01-20 MED ORDER — LISINOPRIL 10 MG PO TABS
40.0000 mg | ORAL_TABLET | Freq: Every day | ORAL | Status: DC
  Filled 2024-01-20: qty 4

## 2024-01-20 MED ORDER — PREDNISONE 20 MG PO TABS
40.0000 mg | ORAL_TABLET | Freq: Every day | ORAL | Status: DC
  Administered 2024-01-21 – 2024-01-23 (×3): 40 mg via ORAL
  Filled 2024-01-20 (×3): qty 2

## 2024-01-20 MED ORDER — PANTOPRAZOLE SODIUM 40 MG PO TBEC
40.0000 mg | DELAYED_RELEASE_TABLET | Freq: Every day | ORAL | Status: DC
  Administered 2024-01-20 – 2024-01-23 (×4): 40 mg via ORAL
  Filled 2024-01-20 (×4): qty 1

## 2024-01-20 MED ORDER — FUROSEMIDE 10 MG/ML IJ SOLN
40.0000 mg | Freq: Once | INTRAMUSCULAR | Status: AC
  Administered 2024-01-20: 40 mg via INTRAVENOUS
  Filled 2024-01-20: qty 4

## 2024-01-20 MED ORDER — MOMETASONE FURO-FORMOTEROL FUM 200-5 MCG/ACT IN AERO
2.0000 | INHALATION_SPRAY | Freq: Two times a day (BID) | RESPIRATORY_TRACT | Status: DC
  Administered 2024-01-20 – 2024-01-23 (×5): 2 via RESPIRATORY_TRACT
  Filled 2024-01-20 (×2): qty 8.8

## 2024-01-20 MED ORDER — PREGABALIN 50 MG PO CAPS
50.0000 mg | ORAL_CAPSULE | Freq: Two times a day (BID) | ORAL | Status: DC
  Administered 2024-01-20 – 2024-01-23 (×6): 50 mg via ORAL
  Filled 2024-01-20 (×6): qty 1

## 2024-01-20 MED ORDER — INSULIN ASPART 100 UNIT/ML IJ SOLN
0.0000 [IU] | Freq: Three times a day (TID) | INTRAMUSCULAR | Status: DC
  Administered 2024-01-20: 7 [IU] via SUBCUTANEOUS
  Administered 2024-01-20: 11 [IU] via SUBCUTANEOUS
  Administered 2024-01-21: 7 [IU] via SUBCUTANEOUS
  Administered 2024-01-21: 3 [IU] via SUBCUTANEOUS
  Administered 2024-01-21: 4 [IU] via SUBCUTANEOUS
  Administered 2024-01-22: 7 [IU] via SUBCUTANEOUS
  Administered 2024-01-22: 3 [IU] via SUBCUTANEOUS
  Administered 2024-01-23: 4 [IU] via SUBCUTANEOUS
  Filled 2024-01-20 (×8): qty 1

## 2024-01-20 MED ORDER — ROSUVASTATIN CALCIUM 10 MG PO TABS
40.0000 mg | ORAL_TABLET | Freq: Every day | ORAL | Status: DC
  Administered 2024-01-21 – 2024-01-23 (×3): 40 mg via ORAL
  Filled 2024-01-20 (×2): qty 4
  Filled 2024-01-20: qty 2

## 2024-01-20 MED ORDER — ONDANSETRON HCL 4 MG/2ML IJ SOLN: 4.0000 mg | Freq: Four times a day (QID) | INTRAMUSCULAR | Status: DC | PRN

## 2024-01-20 MED ORDER — METHYLPREDNISOLONE SODIUM SUCC 125 MG IJ SOLR
125.0000 mg | Freq: Once | INTRAMUSCULAR | Status: AC
  Administered 2024-01-20: 125 mg via INTRAVENOUS
  Filled 2024-01-20: qty 2

## 2024-01-20 MED ORDER — INSULIN GLARGINE-YFGN 100 UNIT/ML ~~LOC~~ SOLN
15.0000 [IU] | SUBCUTANEOUS | Status: DC
Start: 2024-01-20 — End: 2024-01-23
  Administered 2024-01-20 – 2024-01-22 (×3): 15 [IU] via SUBCUTANEOUS
  Filled 2024-01-20 (×4): qty 0.15

## 2024-01-20 MED ORDER — ALLOPURINOL 100 MG PO TABS
400.0000 mg | ORAL_TABLET | Freq: Every day | ORAL | Status: DC
  Administered 2024-01-21 – 2024-01-23 (×3): 400 mg via ORAL
  Filled 2024-01-20 (×2): qty 4
  Filled 2024-01-20: qty 1

## 2024-01-20 MED ORDER — TIOTROPIUM BROMIDE MONOHYDRATE 2.5 MCG/ACT IN AERS
2.0000 | INHALATION_SPRAY | Freq: Every day | RESPIRATORY_TRACT | Status: DC
Start: 2024-01-20 — End: 2024-01-20

## 2024-01-20 MED ORDER — IPRATROPIUM-ALBUTEROL 0.5-2.5 (3) MG/3ML IN SOLN
3.0000 mL | Freq: Four times a day (QID) | RESPIRATORY_TRACT | Status: DC
  Administered 2024-01-20 – 2024-01-21 (×7): 3 mL via RESPIRATORY_TRACT
  Filled 2024-01-20 (×7): qty 3

## 2024-01-20 MED ORDER — BACLOFEN 10 MG PO TABS
10.0000 mg | ORAL_TABLET | Freq: Three times a day (TID) | ORAL | Status: DC
  Administered 2024-01-20 – 2024-01-23 (×8): 10 mg via ORAL
  Filled 2024-01-20 (×10): qty 1

## 2024-01-20 MED ORDER — ACETAMINOPHEN 325 MG PO TABS
650.0000 mg | ORAL_TABLET | ORAL | Status: DC | PRN
  Administered 2024-01-21 – 2024-01-23 (×3): 650 mg via ORAL
  Filled 2024-01-20 (×3): qty 2

## 2024-01-20 MED ORDER — DULOXETINE HCL 30 MG PO CPEP
60.0000 mg | ORAL_CAPSULE | Freq: Every day | ORAL | Status: DC
  Administered 2024-01-21 – 2024-01-23 (×3): 60 mg via ORAL
  Filled 2024-01-20 (×2): qty 2
  Filled 2024-01-20: qty 1

## 2024-01-20 MED ORDER — SODIUM CHLORIDE 0.9 % IV SOLN: 250.0000 mL | INTRAVENOUS | Status: AC | PRN

## 2024-01-20 MED ORDER — ENOXAPARIN SODIUM 60 MG/0.6ML IJ SOSY
50.0000 mg | PREFILLED_SYRINGE | INTRAMUSCULAR | Status: DC
  Administered 2024-01-20 – 2024-01-22 (×3): 50 mg via SUBCUTANEOUS
  Filled 2024-01-20 (×4): qty 0.6

## 2024-01-20 MED ORDER — AMLODIPINE BESYLATE 10 MG PO TABS
10.0000 mg | ORAL_TABLET | Freq: Every day | ORAL | Status: DC
  Administered 2024-01-21 – 2024-01-23 (×3): 10 mg via ORAL
  Filled 2024-01-20 (×2): qty 1
  Filled 2024-01-20: qty 2

## 2024-01-20 MED ORDER — IPRATROPIUM-ALBUTEROL 0.5-2.5 (3) MG/3ML IN SOLN
3.0000 mL | RESPIRATORY_TRACT | Status: AC
Start: 2024-01-20 — End: 2024-01-20
  Administered 2024-01-20 (×3): 3 mL via RESPIRATORY_TRACT
  Filled 2024-01-20 (×3): qty 3

## 2024-01-20 MED ORDER — CARVEDILOL 25 MG PO TABS
25.0000 mg | ORAL_TABLET | Freq: Two times a day (BID) | ORAL | Status: DC
  Administered 2024-01-20 – 2024-01-22 (×4): 25 mg via ORAL
  Filled 2024-01-20 (×2): qty 1
  Filled 2024-01-20 (×2): qty 4

## 2024-01-20 MED ORDER — SPIRONOLACTONE 12.5 MG HALF TABLET
25.0000 mg | ORAL_TABLET | Freq: Every day | ORAL | Status: DC
  Administered 2024-01-20: 25 mg via ORAL
  Filled 2024-01-20 (×2): qty 2

## 2024-01-20 MED ORDER — UMECLIDINIUM BROMIDE 62.5 MCG/ACT IN AEPB
1.0000 | INHALATION_SPRAY | Freq: Every day | RESPIRATORY_TRACT | Status: DC
  Administered 2024-01-21 – 2024-01-23 (×3): 1 via RESPIRATORY_TRACT
  Filled 2024-01-20 (×2): qty 7

## 2024-01-20 MED ORDER — SODIUM CHLORIDE 0.9% FLUSH
3.0000 mL | INTRAVENOUS | Status: DC | PRN
  Administered 2024-01-20: 3 mL via INTRAVENOUS

## 2024-01-20 MED ORDER — BUDESONIDE 0.5 MG/2ML IN SUSP
2.0000 mg | Freq: Two times a day (BID) | RESPIRATORY_TRACT | Status: DC
  Administered 2024-01-20 – 2024-01-22 (×5): 2 mg via RESPIRATORY_TRACT
  Filled 2024-01-20 (×7): qty 8

## 2024-01-20 MED ORDER — EZETIMIBE 10 MG PO TABS
10.0000 mg | ORAL_TABLET | Freq: Every day | ORAL | Status: DC
  Administered 2024-01-21 – 2024-01-23 (×3): 10 mg via ORAL
  Filled 2024-01-20 (×4): qty 1

## 2024-01-20 NOTE — ED Notes (Signed)
2LNC applied 

## 2024-01-20 NOTE — TOC Initial Note (Signed)
 Transition of Care [facility]) - Initial/Assessment Note    Patient Details  Name: Cory Rivas MRN: [id number] Date of Birth: 1966/01/04  [facility] CM/SW Contact:    Cory Broach, LCSW Phone Number: 01/20/2024, 2:31 PM  Clinical Narrative:                 CSW met with patient and introduced self and reason for visit.  Readmission prevention screening questions completed.  Patient's PCP is Cory Rivas, PC-C and he uses [employer].  Patient states that he has no concerns about paying for his medications.  He lives with his sister, Cory Rivas.  He states that he has no DME that he uses in the home.  At discharge, patient's sister, Cory Rivas will transport him home.  PT/OT was consulted.  They signed off with no needs.  No TOC needs identified at this time.        Patient Goals and CMS Choice            Expected Discharge Plan and Services                                              Prior Living Arrangements/Services                       Activities of Daily Living   ADL Screening (condition at time of admission) Independently performs ADLs?: Yes (appropriate for developmental age) Is the patient deaf or have difficulty hearing?: No Does the patient have difficulty seeing, even when wearing glasses/contacts?: No Does the patient have difficulty concentrating, remembering, or making decisions?: No  Permission Sought/Granted                  Emotional Assessment              Admission diagnosis:  CHF (congestive heart failure) (HCC) [I50.9] Patient Active Problem List   Diagnosis Date Noted   CHF (congestive heart failure) (HCC) 01/20/2024   COPD with acute exacerbation (HCC) 10/21/2023   History of colon polyps 09/25/2023   Myocardial injury 08/19/2023   Type II diabetes mellitus with renal manifestations (HCC) 08/19/2023   Obesity (BMI 30-39.9) 08/19/2023   HLD (hyperlipidemia) 08/19/2023   Chronic diastolic CHF  (congestive heart failure) (HCC) 08/19/2023   Diarrhea 08/19/2023   CAD (coronary artery disease) 08/19/2023   CKD (chronic kidney disease) stage 2, GFR 60-89 ml/min 08/19/2023   Acute on chronic diastolic heart failure (HCC) 05/30/2023   Unstable angina (HCC) 05/29/2023   Chest pain 05/27/2023   Type 2 diabetes mellitus with peripheral neuropathy (HCC) 05/27/2023   Gastroesophageal reflux disease without esophagitis 05/27/2023   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   Dyslipidemia 05/27/2023   Acute decompensated heart failure (HCC) 11/23/2022   Hypertensive emergency 11/22/2022   Acute respiratory failure with hypoxia (HCC) 11/22/2022   Elevated troponin 11/22/2022   Chronic heart failure with preserved ejection fraction (HCC) 08/24/2022   Acute on chronic diastolic congestive heart failure (HCC) 08/23/2022   Class 2 obesity 08/23/2022   Uncontrolled diabetes mellitus with hyperglycemia, with long-term current use of insulin (HCC) 08/23/2022   Grade I diastolic dysfunction 06/08/2022   LVH (left ventricular hypertrophy) 06/08/2022   Accelerated hypertension 06/08/2022   S/P cervical spinal fusion 08/18/2021   Former heavy tobacco smoker 08/18/2021   Gout 08/18/2021  Other chronic pain 10/26/2020   Systolic murmur 10/26/2020   Tobacco use 10/26/2020   Polyp of ascending colon    Rectal polyp    Class 2 severe obesity with serious comorbidity and body mass index (BMI) of 39.0 to 39.9 in adult [facility]) 04/15/2019   Chronic kidney disease (CKD) stage G2/A3, mildly decreased glomerular filtration rate (GFR) between 60-89 mL/min/1.73 square meter and albuminuria creatinine ratio greater than 300 mg/g 04/15/2019   Proteinuria 04/15/2019   Hyperlipidemia associated with type 2 diabetes mellitus (HCC) 03/18/2019   Diabetes mellitus (HCC) 03/18/2019   Idiopathic chronic gout of multiple sites without tophus 03/18/2019   Essential hypertension 03/18/2019   Dermatitis 03/18/2019   Lumbar  degenerative disc disease 11/22/2015   Cervical myelopathy (HCC) 11/22/2015   Osteoarthritis of spine with radiculopathy, lumbar region 11/22/2015   Degenerative disc disease, cervical 11/22/2015   Osteoarthritis of spine with radiculopathy, cervical region 11/22/2015   PCP:  Cory Berry, PA-C Pharmacy:   [facility] - [place], [place] - [address] [place] [address] Phone: [phone] Fax: [phone]  [id number] - [address] [place] [address] Phone: [phone] Fax: [phone]  [facility] [place] [facility] [address] [place] [address] Phone: [phone] Fax: [phone]     Social Drivers of Health (SDOH) Social History: SDOH Screenings   Food Insecurity: No Food Insecurity (01/20/2024)  Housing: Low Risk  (01/20/2024)  Transportation Needs: No Transportation Needs (01/20/2024)  Utilities: Not At Risk (01/20/2024)  Alcohol Screen: Low Risk  (12/06/2022)  Depression (PHQ2-9): Medium Risk (01/09/2024)  Financial Resource Strain: Medium Risk (12/06/2022)  Physical Activity: Insufficiently Active (12/06/2022)  Social Connections: Socially Isolated (12/06/2022)  Stress: No Stress Concern Present (12/06/2022)  Tobacco Use: High Risk (01/20/2024)   SDOH Interventions:     Readmission Risk Interventions    01/20/2024    2:31 PM  Readmission Risk Prevention Plan  Transportation Screening Complete  PCP or Specialist Appt within 3-5 Days Complete  HRI or Home Care Consult Complete  Social Work Consult for Recovery Care Planning/Counseling Complete  Palliative Care Screening Not Applicable  Medication Review Oceanographer) Complete

## 2024-01-20 NOTE — ED Provider Notes (Signed)
 [facility] Provider Note    Event Date/Time   First MD Initiated Contact with Patient 01/20/24 [phone]     (approximate)   History   Shortness of Breath and Chest Pain   HPI  Cory Rivas is a 59 y.o. male with history of COPD, CHF, hypertension, hyperlipidemia, diabetes who presents to the emergency department complaints of shortness of breath, wheezing, cough, subjective fevers, pleuritic chest pain.  Here recently for similar symptoms.  No prior history of PE or DVT.  No calf tenderness or calf swelling.  Does not wear oxygen at home.   History provided by patient, family.    Past Medical History:  Diagnosis Date   Bulging of cervical intervertebral disc    CHF (congestive heart failure) (HCC)    COPD (chronic obstructive pulmonary disease) (HCC)    Diabetes mellitus without complication (HCC)    Gout    High cholesterol    Hypertension    Stress due to family tension 04/15/2019    Past Surgical History:  Procedure Laterality Date   BACK SURGERY     CARDIAC CATHETERIZATION     CHOLECYSTECTOMY     COLONOSCOPY WITH PROPOFOL N/A 03/10/2020   Procedure: COLONOSCOPY WITH PROPOFOL;  Surgeon: Midge Minium, MD;  Location: [facility] ENDOSCOPY;  Service: Endoscopy;  Laterality: N/A;   COLONOSCOPY WITH PROPOFOL N/A 09/25/2023   Procedure: COLONOSCOPY WITH PROPOFOL;  Surgeon: Midge Minium, MD;  Location: [facility] ENDOSCOPY;  Service: Endoscopy;  Laterality: N/A;   CORONARY PRESSURE/FFR STUDY N/A 05/29/2023   Procedure: CORONARY PRESSURE/FFR STUDY;  Surgeon: Yvonne Kendall, MD;  Location: [facility] INVASIVE CV LAB;  Service: Cardiovascular;  Laterality: N/A;   POLYPECTOMY  09/25/2023   Procedure: POLYPECTOMY;  Surgeon: Midge Minium, MD;  Location: [facility] ENDOSCOPY;  Service: Endoscopy;;   RIGHT/LEFT HEART CATH AND CORONARY ANGIOGRAPHY N/A 05/29/2023   Procedure: RIGHT/LEFT HEART CATH AND CORONARY ANGIOGRAPHY;  Surgeon: Yvonne Kendall, MD;  Location: [facility] INVASIVE  CV LAB;  Service: Cardiovascular;  Laterality: N/A;   RIGHT/LEFT HEART CATH AND CORONARY ANGIOGRAPHY Bilateral 10/30/2023   Procedure: RIGHT/LEFT HEART CATH AND CORONARY ANGIOGRAPHY;  Surgeon: Yvonne Kendall, MD;  Location: [facility] INVASIVE CV LAB;  Service: Cardiovascular;  Laterality: Bilateral;    MEDICATIONS:  Prior to Admission medications   Medication Sig Start Date End Date Taking? Authorizing Provider  allopurinol (ZYLOPRIM) 300 MG tablet TAKE ONE TABLET BY MOUTH DAILY WITH 100 MG TABLET. FOR A TOTAL OF 400 MG DAILY FOR GOUT 11/11/23   Danelle Berry, PA-C  amLODipine (NORVASC) 10 MG tablet Take 1 tablet (10 mg total) by mouth daily. 10/30/23 10/29/24  End, Cristal Deer, MD  aspirin EC 81 MG tablet Take 1 tablet (81 mg total) by mouth daily. Swallow whole. 11/11/23   Danelle Berry, PA-C  baclofen (LIORESAL) 10 MG tablet Take 1 tablet (10 mg total) by mouth 3 (three) times daily. 01/09/24   Berniece Salines, FNP  budesonide-formoterol (SYMBICORT) 160-4.5 MCG/ACT inhaler Inhale 2 puffs into the lungs 2 (two) times daily. 11/05/23   Raechel Chute, MD  carvedilol (COREG) 25 MG tablet Take 1.5 tablets (37.5 mg total) by mouth 2 (two) times daily. 11/15/23 02/13/24  Sondra Barges, PA-C  Continuous Glucose Receiver (DEXCOM G7 RECEIVER) DEVI Dispense one reciever to be used with 10 d devices for IDDM 10/31/23   Danelle Berry, PA-C  Continuous Glucose Receiver (FREESTYLE LIBRE 3 READER) DEVI Use with libre 3 device as directed 11/04/23   Danelle Berry, PA-C  Continuous Glucose Sensor (  FREESTYLE LIBRE 3 SENSOR) MISC 1 Device by Does not apply route every 14 (fourteen) days. 11/04/23   Danelle Berry, PA-C  DULoxetine (CYMBALTA) 30 MG capsule Take 60 mg (2 capsules) poqam and 30 mg (1 capsule) poqpm daily 11/11/23   Danelle Berry, PA-C  empagliflozin (JARDIANCE) 10 MG TABS tablet Take 1 tablet (10 mg total) by mouth daily. 11/11/23   Danelle Berry, PA-C  Evolocumab (REPATHA SURECLICK) 140 MG/ML SOAJ Inject 140 mg into the skin  every 14 (fourteen) days. 11/27/23   Sondra Barges, PA-C  ezetimibe (ZETIA) 10 MG tablet Take 1 tablet (10 mg total) by mouth daily. 11/11/23   Danelle Berry, PA-C  fluticasone (FLONASE) 50 MCG/ACT nasal spray Place 1 spray into both nostrils daily. 01/23/23 01/23/24  Danelle Berry, PA-C  insulin glargine (LANTUS SOLOSTAR) 100 UNIT/ML Solostar Pen Inject 20 Units into the skin at bedtime. 11/01/20   [provider]  insulin lispro (HUMALOG) 100 UNIT/ML KwikPen Inject 8 Units into the skin 3 (three) times daily. Sliding scale up to 45 units per injection 11/08/20   [provider]  ipratropium-albuterol (DUONEB) 0.5-2.5 (3) MG/3ML SOLN Take 3 mLs by nebulization every 6 (six) hours as needed (SOB wheeze cough). 07/23/22   Danelle Berry, PA-C  levocetirizine (XYZAL) 5 MG tablet TAKE ONE TABLET BY MOUTH EVERY EVENING 01/11/23   Danelle Berry, PA-C  lisinopril (ZESTRIL) 40 MG tablet Take 1 tablet (40 mg total) by mouth daily. 11/04/23   Danelle Berry, PA-C  metFORMIN (GLUCOPHAGE-XR) 500 MG 24 hr tablet Take 1,000 mg by mouth 2 (two) times daily. 10/15/23   [provider]  pantoprazole (PROTONIX) 40 MG tablet Take 1 tablet (40 mg total) by mouth daily. 09/16/23   Berniece Salines, FNP  pregabalin (LYRICA) 50 MG capsule Take 1 capsule (50 mg total) by mouth 2 (two) times daily. 11/11/23   Danelle Berry, PA-C  rosuvastatin (CRESTOR) 40 MG tablet Take 1 tablet (40 mg total) by mouth daily. 11/11/23   Danelle Berry, PA-C  Tiotropium Bromide Monohydrate (SPIRIVA RESPIMAT) 2.5 MCG/ACT AERS Inhale 2 puffs into the lungs daily. 11/05/23   Raechel Chute, MD  Torsemide 40 MG TABS Take 40 mg by mouth daily. 11/04/23   Danelle Berry, PA-C  TRULICITY 3 MG/0.5ML SOPN Inject 3 mg into the skin once a week. SATURDAYS 03/26/22   [provider]  VENTOLIN HFA 108 (90 Base) MCG/ACT inhaler INHALE TWO PUFFS INTO THE LUNGS EVERY SIX HOURS AS NEEDED FOR WHEEZING OR SHORTNESS OF BREATH 04/26/23   Danelle Berry, PA-C     Physical Exam   Triage Vital Signs: ED Triage Vitals  Encounter Vitals Group     BP --      Systolic BP Percentile --      Diastolic BP Percentile --      Pulse Rate 01/20/24 0527 80     Resp 01/20/24 0527 (!) 24     Temp 01/20/24 0527 98.2 F (36.8 C)     Temp Source 01/20/24 0527 Oral     SpO2 01/20/24 0527 (!) 87 %     Weight 01/20/24 0528 230 lb (104.3 kg)     Height --      Head Circumference --      Peak Flow --      Pain Score 01/20/24 0528 7     Pain Loc --      Pain Education --      Exclude from Growth Chart --  Most recent vital signs: Vitals:   01/20/24 0527 01/20/24 0530  BP:  (!) 174/91  Pulse:    Resp:    Temp:    SpO2: 93%     CONSTITUTIONAL: Alert, responds appropriately to questions. Well-appearing; well-nourished HEAD: Normocephalic, atraumatic EYES: Conjunctivae clear, pupils appear equal, sclera nonicteric ENT: normal nose; moist mucous membranes NECK: Supple, normal ROM CARD: RRR; S1 and S2 appreciated RESP: Patient is splinting.  No tachypnea.  Hypoxic to 87% on room air at rest.  Inspiratory and expiratory wheezes.  No rhonchi or rales.  Diminished at bases bilaterally.  No respiratory distress.  Speaking full sentences. ABD/GI: Non-distended; soft, non-tender, no rebound, no guarding, no peritoneal signs BACK: The back appears normal EXT: Normal ROM in all joints; no deformity noted, no edema, no calf tenderness or calf swelling. SKIN: Normal color for age and race; warm; no rash on exposed skin NEURO: Moves all extremities equally, normal speech PSYCH: The patient's mood and manner are appropriate.   ED Results / Procedures / Treatments   LABS: (all labs ordered are listed, but only abnormal results are displayed) Labs Reviewed  BASIC METABOLIC PANEL WITH GFR - Abnormal; Notable for the following components:      Result Value   Glucose, Bld 157 (*)    Creatinine, Ser 1.45 (*)    Calcium 8.2 (*)    GFR, Estimated 57 (*)     All other components within normal limits  BRAIN NATRIURETIC PEPTIDE - Abnormal; Notable for the following components:   B Natriuretic Peptide 152.3 (*)    All other components within normal limits  TROPONIN I (HIGH SENSITIVITY) - Abnormal; Notable for the following components:   Troponin I (High Sensitivity) 21 (*)    All other components within normal limits  RESP PANEL BY RT-PCR (RSV, FLU A&B, COVID)  RVPGX2  CBC WITH DIFFERENTIAL/PLATELET  CK  URIC ACID  TROPONIN I (HIGH SENSITIVITY)     EKG:  EKG Interpretation Date/Time:  Monday January 20 2024 08:23:08 EDT Ventricular Rate:  84 PR Interval:  158 QRS Duration:  82 QT Interval:  374 QTC Calculation: 441 R Axis:   49  Text Interpretation: Normal sinus rhythm T wave abnormality, consider inferolateral ischemia Abnormal ECG When compared with ECG of 15-Jan-2024 06:26, PREVIOUS ECG IS PRESENT Confirmed by UNCONFIRMED, DOCTOR ([id number]), editor Lonell Face [phone]) on 01/20/2024 8:28:44 AM         RADIOLOGY: My personal review and interpretation of imaging: Chest x-ray shows pulmonary edema.  I have personally reviewed all radiology reports.   DG Chest Portable 1 View Result Date: 01/20/2024 CLINICAL DATA:  Shortness of breath and cough. History of COPD and CHF. EXAM: PORTABLE CHEST 1 VIEW COMPARISON:  01/15/24 FINDINGS: Stable cardiomediastinal contours. No pleural effusion, consolidative change or pneumothorax. Chronic coarsened interstitial markings with bronchial wall thickening is again noted compatible with history of COPD. New increase interstitial markings compatible with pulmonary edema. Subsegmental atelectasis noted within both lung bases. Postoperative changes identified within the upper thoracic spine. IMPRESSION: 1. New increase interstitial markings compatible with pulmonary edema. 2. Chronic coarsened interstitial markings with bronchial wall thickening compatible with history of COPD. Electronically Signed   By: Signa Kell M.D.   On: 01/20/2024 06:35     PROCEDURES:  Critical Care performed: Yes, see critical care procedure note(s)   CRITICAL CARE Performed by: Baxter Hire Raygan Skarda   Total critical care time: 35 minutes  Critical care time was exclusive of separately billable procedures and  treating other patients.  Critical care was necessary to treat or prevent imminent or life-threatening deterioration.  Critical care was time spent personally by me on the following activities: development of treatment plan with patient and/or surrogate as well as nursing, discussions with consultants, evaluation of patient's response to treatment, examination of patient, obtaining history from patient or surrogate, ordering and performing treatments and interventions, ordering and review of laboratory studies, ordering and review of radiographic studies, pulse oximetry and re-evaluation of patient's condition.   Marland Kitchen1-3 Lead EKG Interpretation  Performed by: Maryanne Huneycutt, Layla Maw, DO Authorized by: Kemet Nijjar, Layla Maw, DO     Interpretation: normal     ECG rate:  80   ECG rate assessment: normal     Rhythm: sinus rhythm     Ectopy: none     Conduction: normal       IMPRESSION / MDM / ASSESSMENT AND PLAN / ED COURSE  I reviewed the triage vital signs and the nursing notes.    Patient here with pleuritic chest pain, shortness of breath.  Is hypoxic here.  Does not wear oxygen chronically.  The patient is on the cardiac monitor to evaluate for evidence of arrhythmia and/or significant heart rate changes.   DIFFERENTIAL DIAGNOSIS (includes but not limited to):   COPD exacerbation, CHF exacerbation, pneumonia, ACS, PE, viral URI   Patient's presentation is most consistent with acute presentation with potential threat to life or bodily function.   PLAN: Will obtain labs, chest x-ray, COVID and flu swab, EKG.  Will give breathing treatments, Solu-Medrol.  Anticipate admission given new oxygen requirement.  Low  suspicion for PE.  Recent negative D-dimer on 01/15/2024.  Has also had negative dissection study recently.   MEDICATIONS GIVEN IN ED: Medications  furosemide (LASIX) injection 40 mg (has no administration in time range)  ipratropium-albuterol (DUONEB) 0.5-2.5 (3) MG/3ML nebulizer solution 3 mL (3 mLs Nebulization Given 01/20/24 0652)  methylPREDNISolone sodium succinate (SOLU-MEDROL) 125 mg/2 mL injection 125 mg (125 mg Intravenous Given 01/20/24 0548)  furosemide (LASIX) injection 40 mg (40 mg Intravenous Given 01/20/24 2440)     ED COURSE: Normal hemoglobin.  Stable chronic kidney disease.  Troponin minimally elevated which is his baseline.  Repeat pending.  COVID, flu and RSV negative.  BNP elevated at 152.  Chest x-ray reviewed and interpreted by myself and the radiologist and shows findings consistent with pulmonary edema and COPD.  Will give Lasix.  Patient improving with breathing treatments.  Will discuss with hospitalist for admission.   CONSULTS:  Consulted and discussed patient's case with hospitalist, Dr. Chipper Herb.  I have recommended admission and consulting physician agrees and will place admission orders.  Patient (and family if present) agree with this plan.   I reviewed all nursing notes, vitals, pertinent previous records.  All labs, EKGs, imaging ordered have been independently reviewed and interpreted by myself.    OUTSIDE RECORDS REVIEWED: Reviewed prior admissions for COPD exacerbation.   Echo 11/12/23: IMPRESSIONS     1. Left ventricular ejection fraction, by estimation, is 55 to 60%. The  left ventricle has normal function. The left ventricle has no regional  wall motion abnormalities. Left ventricular diastolic parameters were  normal. The average left ventricular  global longitudinal strain is -13.3 %.   2. Right ventricular systolic function is normal. The right ventricular  size is normal. Tricuspid regurgitation signal is inadequate for assessing  PA pressure.    3. The mitral valve is normal in structure. Mild mitral valve  regurgitation. No evidence of mitral stenosis.   4. The aortic valve is normal in structure. There is mild calcification  of the aortic valve. Aortic valve regurgitation is not visualized. Aortic  valve sclerosis is present, with no evidence of aortic valve stenosis.   5. The inferior vena cava is normal in size with greater than 50%  respiratory variability, suggesting right atrial pressure of 3 mmHg.        FINAL CLINICAL IMPRESSION(S) / ED DIAGNOSES   Final diagnoses:  COPD exacerbation (HCC)  Pleuritic chest pain  Acute respiratory failure with hypoxia (HCC)  Acute pulmonary edema (HCC)     Rx / DC Orders   ED Discharge Orders     None        Note:  This document was prepared using Dragon voice recognition software and may include unintentional dictation errors.   Sanna Porcaro, Layla Maw, DO 01/21/24 0202

## 2024-01-20 NOTE — ED Notes (Signed)
 Called CCMD and added pt to board.

## 2024-01-20 NOTE — H&P (Signed)
 History and Physical    DUVAN MOUSEL GMW:[id number] DOB: 08-24-1966 DOA: 01/20/2024  PCP: Danelle Berry, PA-C (Confirm with patient/family/NH records and if not entered, this has to be entered at [facility] point of entry) Patient coming from: Home  I have personally briefly reviewed patient's old medical records in [facility]  Chief Complaint: AMS  HPI: Cory Rivas is a 58 y.o. male with medical history significant of CAD, chronic HFpEF, COPD Gold stage II, HTN, HLD, gout, CKD stage II, presented with worsening of cough wheezing shortness of breath.  Symptoms started 2 weeks ago, patient started to have increasing exertional dyspnea and ported a cough with clear phlegm.  He also noticed about 10 pounds of weight gaining for about 1 month.  He also said that the torsemide has been taking " no longer works" as his urine output has not been as before after taking torsemide in the morning.  But he has not tried a double dose of his diuresis.  Last night, patient also started to have wheezing and cannot sleep and decided come to ED.  Denies any fever chills no peripheral edema.  No chest pains.  Over the weekend patient also had a flareup of his gout on the right toes, for which he took 1 dose of colchicine and get some relief, he continues to take allopurinol every day.  ED Course: Afebrile, blood pressure 170/90 O2 saturation 87% on room air and stabilized on 2 L.  Chest x-ray showed pulmonary congestion as well as chronic interstitial changes compatible with COPD.  Blood work showed WBC 9.7 hemoglobin 13.9, creatinine 1.4 compared to baseline 1.1-1.3.  K3.7  Patient was given Lasix x 1, IV Solu-Medrol x 1 and DuoNeb x 1 in the ED.  Review of Systems: As per HPI otherwise 14 point review of systems negative.    Past Medical History:  Diagnosis Date   Bulging of cervical intervertebral disc    CHF (congestive heart failure) (HCC)    COPD (chronic obstructive pulmonary disease) (HCC)     Diabetes mellitus without complication (HCC)    Gout    High cholesterol    Hypertension    Stress due to family tension 04/15/2019    Past Surgical History:  Procedure Laterality Date   BACK SURGERY     CARDIAC CATHETERIZATION     CHOLECYSTECTOMY     COLONOSCOPY WITH PROPOFOL N/A 03/10/2020   Procedure: COLONOSCOPY WITH PROPOFOL;  Surgeon: Midge Minium, MD;  Location: [facility] ENDOSCOPY;  Service: Endoscopy;  Laterality: N/A;   COLONOSCOPY WITH PROPOFOL N/A 09/25/2023   Procedure: COLONOSCOPY WITH PROPOFOL;  Surgeon: Midge Minium, MD;  Location: [facility] ENDOSCOPY;  Service: Endoscopy;  Laterality: N/A;   CORONARY PRESSURE/FFR STUDY N/A 05/29/2023   Procedure: CORONARY PRESSURE/FFR STUDY;  Surgeon: Yvonne Kendall, MD;  Location: [facility] INVASIVE CV LAB;  Service: Cardiovascular;  Laterality: N/A;   POLYPECTOMY  09/25/2023   Procedure: POLYPECTOMY;  Surgeon: Midge Minium, MD;  Location: [facility] ENDOSCOPY;  Service: Endoscopy;;   RIGHT/LEFT HEART CATH AND CORONARY ANGIOGRAPHY N/A 05/29/2023   Procedure: RIGHT/LEFT HEART CATH AND CORONARY ANGIOGRAPHY;  Surgeon: Yvonne Kendall, MD;  Location: [facility] INVASIVE CV LAB;  Service: Cardiovascular;  Laterality: N/A;   RIGHT/LEFT HEART CATH AND CORONARY ANGIOGRAPHY Bilateral 10/30/2023   Procedure: RIGHT/LEFT HEART CATH AND CORONARY ANGIOGRAPHY;  Surgeon: Yvonne Kendall, MD;  Location: [facility] INVASIVE CV LAB;  Service: Cardiovascular;  Laterality: Bilateral;     reports that he has been smoking cigarettes. He started smoking  about 34 years ago. He has a 37 pack-year smoking history. He has never used smokeless tobacco. He reports that he does not drink alcohol and does not use drugs.  Allergies  Allergen Reactions   Oxycodone Nausea And Vomiting   Entresto [Sacubitril-Valsartan] Nausea Only and Other (See Comments)    Lightheaded and dizzy    Onion Nausea And Vomiting   Nitroglycerin Itching and Rash    Family History  Problem Relation Age of Onset    Heart disease Mother    Diabetes Mother    Hyperlipidemia Mother    Hypertension Mother    Heart attack Mother    Lung cancer Father    Hypertension Father    Stroke Father    AAA (abdominal aortic aneurysm) Maternal Grandmother    Heart attack Maternal Grandmother    Diabetes Maternal Grandfather      Prior to Admission medications   Medication Sig Start Date End Date Taking? Authorizing Provider  allopurinol (ZYLOPRIM) 300 MG tablet TAKE ONE TABLET BY MOUTH DAILY WITH 100 MG TABLET. FOR A TOTAL OF 400 MG DAILY FOR GOUT 11/11/23   Danelle Berry, PA-C  amLODipine (NORVASC) 10 MG tablet Take 1 tablet (10 mg total) by mouth daily. 10/30/23 10/29/24  End, Cristal Deer, MD  aspirin EC 81 MG tablet Take 1 tablet (81 mg total) by mouth daily. Swallow whole. 11/11/23   Danelle Berry, PA-C  baclofen (LIORESAL) 10 MG tablet Take 1 tablet (10 mg total) by mouth 3 (three) times daily. 01/09/24   Berniece Salines, FNP  budesonide-formoterol (SYMBICORT) 160-4.5 MCG/ACT inhaler Inhale 2 puffs into the lungs 2 (two) times daily. 11/05/23   Raechel Chute, MD  carvedilol (COREG) 25 MG tablet Take 1.5 tablets (37.5 mg total) by mouth 2 (two) times daily. 11/15/23 02/13/24  Sondra Barges, PA-C  Continuous Glucose Receiver (DEXCOM G7 RECEIVER) DEVI Dispense one reciever to be used with 10 d devices for IDDM 10/31/23   Danelle Berry, PA-C  Continuous Glucose Receiver (FREESTYLE LIBRE 3 READER) DEVI Use with libre 3 device as directed 11/04/23   Danelle Berry, PA-C  Continuous Glucose Sensor (FREESTYLE LIBRE 3 SENSOR) MISC 1 Device by Does not apply route every 14 (fourteen) days. 11/04/23   Danelle Berry, PA-C  DULoxetine (CYMBALTA) 30 MG capsule Take 60 mg (2 capsules) poqam and 30 mg (1 capsule) poqpm daily 11/11/23   Danelle Berry, PA-C  empagliflozin (JARDIANCE) 10 MG TABS tablet Take 1 tablet (10 mg total) by mouth daily. 11/11/23   Danelle Berry, PA-C  Evolocumab (REPATHA SURECLICK) 140 MG/ML SOAJ Inject 140 mg into the skin  every 14 (fourteen) days. 11/27/23   Sondra Barges, PA-C  ezetimibe (ZETIA) 10 MG tablet Take 1 tablet (10 mg total) by mouth daily. 11/11/23   Danelle Berry, PA-C  fluticasone (FLONASE) 50 MCG/ACT nasal spray Place 1 spray into both nostrils daily. 01/23/23 01/23/24  Danelle Berry, PA-C  insulin glargine (LANTUS SOLOSTAR) 100 UNIT/ML Solostar Pen Inject 20 Units into the skin at bedtime. 11/01/20   [provider]  insulin lispro (HUMALOG) 100 UNIT/ML KwikPen Inject 8 Units into the skin 3 (three) times daily. Sliding scale up to 45 units per injection 11/08/20   [provider]  ipratropium-albuterol (DUONEB) 0.5-2.5 (3) MG/3ML SOLN Take 3 mLs by nebulization every 6 (six) hours as needed (SOB wheeze cough). 07/23/22   Danelle Berry, PA-C  levocetirizine (XYZAL) 5 MG tablet TAKE ONE TABLET BY MOUTH EVERY EVENING 01/11/23   Danelle Berry, PA-C  lisinopril (ZESTRIL) 40 MG tablet Take 1 tablet (40 mg total) by mouth daily. 11/04/23   Danelle Berry, PA-C  metFORMIN (GLUCOPHAGE-XR) 500 MG 24 hr tablet Take 1,000 mg by mouth 2 (two) times daily. 10/15/23   [provider]  pantoprazole (PROTONIX) 40 MG tablet Take 1 tablet (40 mg total) by mouth daily. 09/16/23   Berniece Salines, FNP  pregabalin (LYRICA) 50 MG capsule Take 1 capsule (50 mg total) by mouth 2 (two) times daily. 11/11/23   Danelle Berry, PA-C  rosuvastatin (CRESTOR) 40 MG tablet Take 1 tablet (40 mg total) by mouth daily. 11/11/23   Danelle Berry, PA-C  Tiotropium Bromide Monohydrate (SPIRIVA RESPIMAT) 2.5 MCG/ACT AERS Inhale 2 puffs into the lungs daily. 11/05/23   Raechel Chute, MD  Torsemide 40 MG TABS Take 40 mg by mouth daily. 11/04/23   Danelle Berry, PA-C  TRULICITY 3 MG/0.5ML SOPN Inject 3 mg into the skin once a week. SATURDAYS 03/26/22   [provider]  VENTOLIN HFA 108 (90 Base) MCG/ACT inhaler INHALE TWO PUFFS INTO THE LUNGS EVERY SIX HOURS AS NEEDED FOR WHEEZING OR SHORTNESS OF BREATH 04/26/23   Danelle Berry, PA-C     Physical Exam: Vitals:   01/20/24 0527 01/20/24 0527 01/20/24 0528 01/20/24 0530  BP:    (!) 174/91  Pulse: 80     Resp: (!) 24     Temp: 98.2 F (36.8 C)     TempSrc: Oral     SpO2: (!) 87% 93%    Weight:   104.3 kg     Constitutional: NAD, calm, comfortable Vitals:   01/20/24 0527 01/20/24 0527 01/20/24 0528 01/20/24 0530  BP:    (!) 174/91  Pulse: 80     Resp: (!) 24     Temp: 98.2 F (36.8 C)     TempSrc: Oral     SpO2: (!) 87% 93%    Weight:   104.3 kg    Eyes: PERRL, lids and conjunctivae normal ENMT: Mucous membranes are moist. Posterior pharynx clear of any exudate or lesions.Normal dentition.  Neck: normal, supple, no masses, no thyromegaly Respiratory: clear to auscultation bilaterally, fine crackles bilaterally to the mid field, scattered wheezing, increasing respiratory effort. No accessory muscle use.  Cardiovascular: Regular rate and rhythm, no murmurs / rubs / gallops.  Trace extremity edema. 2+ pedal pulses. No carotid bruits.  Abdomen: no tenderness, no masses palpated. No hepatosplenomegaly. Bowel sounds positive.  Musculoskeletal: no clubbing / cyanosis. No joint deformity upper and lower extremities. Good ROM, no contractures. Normal muscle tone.  Skin: no rashes, lesions, ulcers. No induration Neurologic: CN 2-12 grossly intact. Sensation intact, DTR normal. Strength 5/5 in all 4.  Psychiatric: Normal judgment and insight. Alert and oriented x 3. Normal mood.    Labs on Admission: I have personally reviewed following labs and imaging studies  CBC: Recent Labs  Lab 01/15/24 0247 01/20/24 0600  WBC 11.0* 9.7  NEUTROABS 7.7 7.0  HGB 14.4 13.9  HCT 42.8 42.5  MCV 86.6 88.2  PLT 203 195   Basic Metabolic Panel: Recent Labs  Lab 01/15/24 0247 01/20/24 0600  NA 137 139  K 3.8 3.7  CL 103 104  CO2 24 25  GLUCOSE 178* 157*  BUN 17 12  CREATININE 1.31* 1.45*  CALCIUM 8.6* 8.2*   GFR: Estimated Creatinine Clearance: 64.4 mL/min (A) (by  C-G formula based on SCr of 1.45 mg/dL (H)). Liver Function Tests: Recent Labs  Lab 01/15/24 0247  AST 20  ALT 16  ALKPHOS 51  BILITOT 0.5  PROT 6.8  ALBUMIN 3.7   No results for input(s): "LIPASE", "AMYLASE" in the last 168 hours. No results for input(s): "AMMONIA" in the last 168 hours. Coagulation Profile: No results for input(s): "INR", "PROTIME" in the last 168 hours. Cardiac Enzymes: Recent Labs  Lab 01/20/24 0750  CKTOTAL 454*   BNP (last 3 results) No results for input(s): "PROBNP" in the last 8760 hours. HbA1C: No results for input(s): "HGBA1C" in the last 72 hours. CBG: No results for input(s): "GLUCAP" in the last 168 hours. Lipid Profile: No results for input(s): "CHOL", "HDL", "LDLCALC", "TRIG", "CHOLHDL", "LDLDIRECT" in the last 72 hours. Thyroid Function Tests: No results for input(s): "TSH", "T4TOTAL", "FREET4", "T3FREE", "THYROIDAB" in the last 72 hours. Anemia Panel: No results for input(s): "VITAMINB12", "FOLATE", "FERRITIN", "TIBC", "IRON", "RETICCTPCT" in the last 72 hours. Urine analysis:    Component Value Date/Time   COLORURINE YELLOW (A) 03/25/2023 1236   APPEARANCEUR HAZY (A) 03/25/2023 1236   APPEARANCEUR Clear 04/28/2014 2040   LABSPEC 1.027 03/25/2023 1236   LABSPEC 1.030 04/28/2014 2040   PHURINE 5.0 03/25/2023 1236   GLUCOSEU 150 (A) 03/25/2023 1236   GLUCOSEU Negative 04/28/2014 2040   HGBUR NEGATIVE 03/25/2023 1236   BILIRUBINUR NEGATIVE 03/25/2023 1236   BILIRUBINUR Negative 06/27/2022 1527   BILIRUBINUR Negative 04/28/2014 2040   KETONESUR NEGATIVE 03/25/2023 1236   PROTEINUR >=300 (A) 03/25/2023 1236   UROBILINOGEN 0.2 06/27/2022 1527   NITRITE NEGATIVE 03/25/2023 1236   LEUKOCYTESUR NEGATIVE 03/25/2023 1236   LEUKOCYTESUR Negative 04/28/2014 2040    Radiological Exams on Admission: DG Chest Portable 1 View Result Date: 01/20/2024 CLINICAL DATA:  Shortness of breath and cough. History of COPD and CHF. EXAM: PORTABLE CHEST 1  VIEW COMPARISON:  01/15/24 FINDINGS: Stable cardiomediastinal contours. No pleural effusion, consolidative change or pneumothorax. Chronic coarsened interstitial markings with bronchial wall thickening is again noted compatible with history of COPD. New increase interstitial markings compatible with pulmonary edema. Subsegmental atelectasis noted within both lung bases. Postoperative changes identified within the upper thoracic spine. IMPRESSION: 1. New increase interstitial markings compatible with pulmonary edema. 2. Chronic coarsened interstitial markings with bronchial wall thickening compatible with history of COPD. Electronically Signed   By: Signa Kell M.D.   On: 01/20/2024 06:35    EKG: Independently reviewed.  Chronic ST-T changes on lead to 3 aVF and V4-V6, sinus rhythm  Assessment/Plan Principal Problem:   CHF (congestive heart failure) (HCC)  (please populate well all problems here in Problem List. (For example, if patient is on BP meds at home and you resume or decide to hold them, it is a problem that needs to be her. Same for CAD, COPD, HLD and so on)  Acute hypoxic respiratory failure - Likely multifactorial from recommendation of acute on chronic HFpEF decompensation as well as acute COPD exacerbation - Continue to wean him off oxygen - Incentive spirometry and flutter valve  Acute COPD exacerbation - P.o. prednisone - DuoNebs - ICS and LABA - Guaifenesin and Tessalon  Acute on chronic HFpEF decompensation - Continue diuresis IV Lasix 40 mg twice daily, repeat chest x-ray tomorrow - Discussed with patient regarding increased baseline torsemide from 40 mg to 60-80 mg daily and consider additional as needed torsemide for weight gaining and shortness of breath.  Patient expressed understanding and agreed with plan. - Echocardiogram were done recently, will not repeat.  History of CAD - Status post recent LHC showed a stable LAD and OMI  stenosis.  Cardiology recommended  continue with medical management - Continue aspirin, Repatha lisinopril and Crestor  History of mild pulmonary hypertension - History of OSA, off CPAP due to insurance issue.  IDDM -SSI - Lantus 15 units daily  Morbid obesity - BMI> 37, recommend calorie control  Gout - Status post recent flareup - Given his history of CKD, briefly discussed with him regarding benefits and risk of using colchicine for flareup versus steroid.  Patient will follow up this issue with nephrology at [facility].  DVT prophylaxis: Lovenox Code Status: Full code Family Communication: None at bedside Disposition Plan: Patient is sick with significant acute hypoxic respiratory failure requiring IV Lasix and steroid to treat commendation of COPD as well as CHF decompensation, expect more than 2 midnight hospital stay. Consults called: None Admission status: Telemetry admission   Emeline General MD [facility] Pager [phone]  01/20/2024, 8:58 AM

## 2024-01-20 NOTE — ED Triage Notes (Signed)
 Pt in POV with c/o sob throughout the night and chest pain that began an hr ago. Hx of COPD and CHF, states he has been coughing and wheezing tonight. Sats 87% on RA

## 2024-01-20 NOTE — Progress Notes (Signed)
 PT Cancellation Note  Patient Details Name: Cory Rivas MRN: [id number] DOB: 1966/08/15   Cancelled Treatment:    Reason Eval/Treat Not Completed: PT screened, no needs identified, will sign off PT orders received, chart reviewed. OT reports pt is mobilizing independently in the room & reports he's at his baseline with mobility. PT to complete current orders. Please re-consult if new needs arise.  Aleda Grana, PT, DPT 01/20/24, 9:44 AM   Sandi Mariscal 01/20/2024, 9:44 AM

## 2024-01-20 NOTE — Progress Notes (Signed)
 OT Cancellation Note  Patient Details Name: Cory Rivas MRN: [id number] DOB: Jul 24, 1966   Cancelled Treatment:    Reason Eval/Treat Not Completed: OT screened, no needs identified, will sign off. Order received, chart reviewed. On arrival pt walking IND to bathroom, reports back to baseline functional independence on room air SpO2 94%. No skilled OT needs identified. Will sign off. Please re-consult if additional needs arise.   Kathie Dike, M.S. OTR/L  01/20/24, 9:42 AM  ascom [phone]

## 2024-01-20 NOTE — ED Notes (Signed)
 Assumed patient care and received report from the previous nurse.

## 2024-01-21 ENCOUNTER — Inpatient Hospital Stay

## 2024-01-21 ENCOUNTER — Telehealth (HOSPITAL_COMMUNITY): Payer: Self-pay | Admitting: Pharmacy Technician

## 2024-01-21 ENCOUNTER — Other Ambulatory Visit (HOSPITAL_COMMUNITY): Payer: Self-pay

## 2024-01-21 ENCOUNTER — Encounter: Payer: Self-pay | Admitting: Internal Medicine

## 2024-01-21 DIAGNOSIS — J441 Chronic obstructive pulmonary disease with (acute) exacerbation: Secondary | ICD-10-CM | POA: Diagnosis not present

## 2024-01-21 DIAGNOSIS — J449 Chronic obstructive pulmonary disease, unspecified: Secondary | ICD-10-CM | POA: Diagnosis not present

## 2024-01-21 DIAGNOSIS — R0989 Other specified symptoms and signs involving the circulatory and respiratory systems: Secondary | ICD-10-CM | POA: Diagnosis not present

## 2024-01-21 DIAGNOSIS — R918 Other nonspecific abnormal finding of lung field: Secondary | ICD-10-CM | POA: Diagnosis not present

## 2024-01-21 DIAGNOSIS — R0602 Shortness of breath: Secondary | ICD-10-CM | POA: Diagnosis not present

## 2024-01-21 DIAGNOSIS — I509 Heart failure, unspecified: Secondary | ICD-10-CM | POA: Diagnosis not present

## 2024-01-21 DIAGNOSIS — I5033 Acute on chronic diastolic (congestive) heart failure: Secondary | ICD-10-CM | POA: Diagnosis not present

## 2024-01-21 LAB — BASIC METABOLIC PANEL WITH GFR
Anion gap: 8 (ref 5–15)
BUN: 24 mg/dL — ABNORMAL HIGH (ref 6–20)
CO2: 26 mmol/L (ref 22–32)
Calcium: 8.6 mg/dL — ABNORMAL LOW (ref 8.9–10.3)
Chloride: 103 mmol/L (ref 98–111)
Creatinine, Ser: 1.61 mg/dL — ABNORMAL HIGH (ref 0.61–1.24)
GFR, Estimated: 50 mL/min — ABNORMAL LOW (ref >60)
Glucose, Bld: 167 mg/dL — ABNORMAL HIGH (ref 70–99)
Potassium: 5.1 mmol/L (ref 3.5–5.1)
Sodium: 137 mmol/L (ref 135–145)

## 2024-01-21 LAB — CBG MONITORING, ED
Glucose-Capillary: 134 mg/dL — ABNORMAL HIGH (ref 70–99)
Glucose-Capillary: 215 mg/dL — ABNORMAL HIGH (ref 70–99)
Glucose-Capillary: 177 mg/dL — ABNORMAL HIGH (ref 70–99)

## 2024-01-21 LAB — HIV ANTIBODY (ROUTINE TESTING W REFLEX): HIV Screen 4th Generation wRfx: NONREACTIVE

## 2024-01-21 LAB — GLUCOSE, CAPILLARY: Glucose-Capillary: 132 mg/dL — ABNORMAL HIGH (ref 70–99)

## 2024-01-21 MED ORDER — SPIRONOLACTONE 12.5 MG HALF TABLET: 25.0000 mg | ORAL_TABLET | Freq: Every day | ORAL | Status: DC

## 2024-01-21 MED ORDER — IPRATROPIUM-ALBUTEROL 0.5-2.5 (3) MG/3ML IN SOLN
3.0000 mL | Freq: Three times a day (TID) | RESPIRATORY_TRACT | Status: DC
  Administered 2024-01-22 – 2024-01-23 (×4): 3 mL via RESPIRATORY_TRACT
  Filled 2024-01-21 (×4): qty 3

## 2024-01-21 MED ORDER — FUROSEMIDE 10 MG/ML IJ SOLN
40.0000 mg | Freq: Every day | INTRAMUSCULAR | Status: DC
  Administered 2024-01-22: 40 mg via INTRAVENOUS
  Filled 2024-01-21: qty 4

## 2024-01-21 NOTE — ED Notes (Signed)
 TRANSPORT paged @ this time to assist in transferring pt to assigned in-patient room

## 2024-01-21 NOTE — Progress Notes (Signed)
 Pt is transferred from ED to [id number]. He has been alert and fully oriented x 4, afebrile, stable hemodynamically, on 2 LPM of O2 NCL, SPO2 97%, no SOB, no obvious acute distress at arrival.  Room oriented, call bell within reachable. Pt is able to ambulate independently in his room, no SOB with exertion. We will continue to monitor.    01/21/24 1934  Vitals  Temp 98.6 F (37 C)  Temp Source Oral  BP 126/65  MAP (mmHg) 82  BP Location Left Arm  BP Method Automatic  Patient Position (if appropriate) Lying  Pulse Rate 72  Pulse Rate Source Dinamap  ECG Heart Rate 72  Resp 20  Level of Consciousness  Level of Consciousness Alert  MEWS COLOR  MEWS Score Color Green  Oxygen Therapy  SpO2 97 %  O2 Device Nasal Cannula  O2 Flow Rate (L/min) 2 L/min   Filiberto Pinks, RN

## 2024-01-21 NOTE — Progress Notes (Signed)
 Progress Note   Patient: Cory Rivas:[id number] DOB: Mar 01, 1966 DOA: 01/20/2024     1 DOS: the patient was seen and examined on 01/21/2024   Brief hospital course:  Cory Rivas is a 58 y.o. male with medical history significant of CAD, chronic HFpEF, COPD Gold stage II, HTN, HLD, gout, CKD stage II, presented with worsening of cough wheezing shortness of breath.   Symptoms started 2 weeks ago, patient started to have increasing exertional dyspnea and ported a cough with clear phlegm.  He also noticed about 10 pounds of weight gaining for about 1 month.  He also said that the torsemide has been taking " no longer works" as his urine output has not been as before after taking torsemide in the morning.  But he has not tried a double dose of his diuresis.  Last night, patient also started to have wheezing and cannot sleep and decided come to ED.  Denies any fever chills no peripheral edema.  No chest pains.  Over the weekend patient also had a flareup of his gout on the right toes, for which he took 1 dose of colchicine and get some relief, he continues to take allopurinol every day.   ED Course: Afebrile, blood pressure 170/90 O2 saturation 87% on room air and stabilized on 2 L.  Chest x-ray showed pulmonary congestion as well as chronic interstitial changes compatible with COPD.  Blood work showed WBC 9.7 hemoglobin 13.9, creatinine 1.4 compared to baseline 1.1-1.3.  K3.7    Assessment and Plan:  Acute hypoxic respiratory failure Likely multifactorial from acute on chronic HFpEF as well as acute COPD exacerbation Patient had room air pulse oximetry of 87% and is currently on 2 L maintaining pulse oximetry greater than 92%. Will assess for home oxygen need prior to discharge    Acute COPD exacerbation Improved Continue systemic and inhaled steroids Continue scheduled and as needed bronchodilator therapy Continue antitussives    Acute on chronic HFpEF decompensation Continue  diuresis IV Lasix 40 mg twice daily Discussed with patient regarding increased baseline torsemide from 40 mg to 60-80 mg daily and consider additional as needed torsemide for weight gaining and shortness of breath.  Patient expressed understanding and agreed with plan. Patient had a 2D echocardiogram which was done 01/25 and showed a normal LVEF of 55 to 60% with no regional wall motion abnormalities.    History of CAD Status post recent LHC which showed a stable LAD and OMI stenosis.   Cardiology recommended to continue medical management Continue aspirin, Repatha lisinopril and Crestor     History of mild pulmonary hypertension History of OSA Not on CPAP due to insurance issue.    Insulin-dependent diabetes mellitus Continue Lantus 15 units daily as well as sliding scale insulin Maintain consistent carbohydrate diet    Morbid obesity BMI 45.1 Complicates overall prognosis and care         Subjective: Feels better.  Remains on oxygen supplementation  Physical Exam: Vitals:   01/21/24 1019 01/21/24 1153 01/21/24 1200 01/21/24 1300  BP: (!) 157/78  134/74 (!) 145/81  Pulse:   68 64  Resp:   (!) 23 20  Temp:  97.9 F (36.6 C)    TempSrc:  Oral    SpO2:   94% 95%  Weight:      Height:        Eyes: PERRL, lids and conjunctivae normal ENMT: Mucous membranes are moist. Posterior pharynx clear of any exudate or lesions.Normal dentition.  Neck: normal, supple, no masses, no thyromegaly Respiratory: clear to auscultation bilaterally, fine crackles bilaterally to the mid field, scattered wheezing, increasing respiratory effort. No accessory muscle use.  Cardiovascular: Regular rate and rhythm, no murmurs / rubs / gallops.  Trace extremity edema. 2+ pedal pulses. No carotid bruits.  Abdomen: no tenderness, no masses palpated. No hepatosplenomegaly. Bowel sounds positive.  Musculoskeletal: no clubbing / cyanosis. No joint deformity upper and lower extremities. Good ROM, no  contractures. Normal muscle tone.  Skin: no rashes, lesions, ulcers. No induration Neurologic: CN 2-12 grossly intact. Sensation intact, DTR normal. Strength 5/5 in all 4.  Psychiatric: Normal judgment and insight. Alert and oriented x 3. Normal mood.     Data Reviewed: Labs reviewed.  Creatinine 1.61 compared to baseline of 1.45 There are no new results to review at this time.  Family Communication: Plan of care was discussed with patient in detail.  He verbalizes understanding and agrees with the plan  Disposition: Status is: Inpatient Remains inpatient appropriate because: On IV diuretics  Planned Discharge Destination: Home    Time spent: 36 minutes  Author: Lucile Shutters, MD 01/21/2024 2:19 PM  For on call review [email or web address].

## 2024-01-21 NOTE — Consult Note (Signed)
 Cardiology Consultation   Patient ID: DOW BLAHNIK MRN: [id number]; DOB: May 08, 1966  Admit date: 01/20/2024 Date of Consult: 01/21/2024  PCP:  Danelle Berry, PA-C   [facility] Providers Cardiologist:  Debbe Odea, MD        Patient Profile:   Cory Rivas is a 58 y.o. male with a hx of nonobstructive CAD, HFpEF, CKD stage III AAA, IDDM, hypertension, hyperlipidemia, cervical myelopathy, idiopathic chronic gout, tobacco use for 35+ years, asthma, COPD, obesity with OSA and tracheobronchomalacia who is being seen 01/21/2024 for the evaluation of CHF exacerbation at the request of Dr. Joylene Igo.  History of Present Illness:   Cory Rivas underwent Lexiscan MPI 09/2020 which showed no significant ischemia or calcification on CT. Echo 10/2020 showed EF 60 to 65%, no RWMA, G1 DD, no significant valvular abnormalities. Echo 08/2022 showed EF 60 to 65%, no RWMA, mild LVH, G2 DD, normal RV systolic function and size, mild MR, and aortic valve sclerosis without evidence of stenosis. Admitted to the hospital 05/2023 with chest pain and shortness of breath with diaphoresis. Echo showed EF 50 to 55%, no RWMA, moderate LVH, G3 DD, elevated left atrial pressure, mildly dilated left atrium, mild MR, aortic valve sclerosis without stenosis. Right and left heart cath was done and showed moderate nonobstructive CAD with sequential 50 to 60% ostial and proximal LAD stenosis that were not hemodynamically significant. There is also 40% ostial stenosis of a large OM1 branch. The nondominant RCA was without significant disease. Upper normal to mildly elevated left and right heart filling pressures. Mild pulmonary hypertension.  Borderline low cardiac output/index. Mild aortic stenosis. Gentle diuresis and medical therapy/risk factor modification recommended. Seen in follow-up 06/2023 and was doing well from a cardiac perspective. Weight was down 7 pounds. He continued to smoke half pack cigarettes per day.  Carvedilol was titrated to 12.5 mg twice daily with continuation of Jardiance, lisinopril, spironolactone, and torsemide. Admitted again 08/2023 with hypertensive urgency. Was diuresed with IV Lasix with improvement in blood pressure. Mildly elevated troponin was felt to be reflective of supply/demand ischemia in the context of volume overload and hypertensive urgency. He was admitted again in 10/2023 with acute hypoxic respiratory failure secondary to COPD exacerbation and acute on chronic HFpEF with admission complicated by acute CKD stage IIIa. He was treated with supplemental oxygen, IV Lasix followed by increasing home torsemide to 40 mg twice daily, Solu-Medrol, DuoNebs, Rocephin, and azithromycin. At the time of his discharge, lisinopril spironolactone, and torsemide were held due to AKI. Seen in outpatient follow-up 10/28/2023 with ongoing chest discomfort and dyspnea. Given ongoing symptoms, patient underwent right and left heart catheterization 10/30/2023 that showed moderate LAD and OM1 disease, not significantly different than when compared to cath 05/2023. Ostial and proximal LAD stenosis was not hemodynamically significant. Normal left and right heart filling pressures, mild pulmonary hypertension, normal cardiac output and index. Escalation of medical therapy was recommended.  Echo 11/12/2023 showed EF 55 to 60%, no RWMA, normal LV diastolic function parameters, normal RV systolic function and size, mild MR, aortic valve sclerosis without stenosis. Seen again in follow-up 11/15/2023 and was overall doing well from a cardiac perspective reporting feeling the best he had in quite some time.  Continued to smoke approximately 1/4 pack/day. Blood pressure was elevated in the office, and carvedilol was titrated up to 37.5 mg twice daily. He remained off spironolactone with noted AKI. No further testing or medication changes were indicated. He was seen in the emergency department 01/15/2024  for chest pain x 2 to 3  days with associated productive cough. At that time he reported this was worse with deep inspiration and better with sitting upright.  Reported improvements with breathing treatments. Troponin was negative x 2 with EKG unchanged from prior. BNP minimally elevated. It was thought to be musculoskeletal discomfort possibly secondary to COPD flare. Patient received Toradol and nebulizer treatments. He was felt to be stable for discharge.  Today, patient reports ongoing shortness of breath and cough for the past several days. He reports gradual weight gain of 10 pounds over the past month with LE swelling. Also reports his torsemide is no longer working. He endorses chest tightness radiating into his neck. He describes this as feeling as though someone is squeezing his chest and neck . Last night, he started to notice shortness of breath worsening with associated wheezing and was unable to sleep. He ultimately decided to report to the emergency department. In the ED, blood pressure elevated at 174/91 with RR 24, and SpO2 87% on room air. Creatinine near his baseline at 1.45. CBC with differential unremarkable. BNP mildly elevated at 152. Troponin minimally elevated at 21>21. Respiratory panel negative. Chest x-ray with signs of pulmonary edema. EKG showed sinus rhythm with inferolateral T wave inversions unchanged from prior. Received DuoNebs and Solu-Medrol.  Patient received IV Lasix 40 mg x 3 with kidney function worsening, creatinine trending up to 1.61 and BUN 24. Cardiology was consulted for acute CHF exacerbation.  Past Medical History:  Diagnosis Date   Bulging of cervical intervertebral disc    CHF (congestive heart failure) (HCC)    COPD (chronic obstructive pulmonary disease) (HCC)    Diabetes mellitus without complication (HCC)    Gout    High cholesterol    Hypertension    Stress due to family tension 04/15/2019    Past Surgical History:  Procedure Laterality Date   BACK SURGERY      CARDIAC CATHETERIZATION     CHOLECYSTECTOMY     COLONOSCOPY WITH PROPOFOL N/A 03/10/2020   Procedure: COLONOSCOPY WITH PROPOFOL;  Surgeon: Midge Minium, MD;  Location: [facility] ENDOSCOPY;  Service: Endoscopy;  Laterality: N/A;   COLONOSCOPY WITH PROPOFOL N/A 09/25/2023   Procedure: COLONOSCOPY WITH PROPOFOL;  Surgeon: Midge Minium, MD;  Location: [facility] ENDOSCOPY;  Service: Endoscopy;  Laterality: N/A;   CORONARY PRESSURE/FFR STUDY N/A 05/29/2023   Procedure: CORONARY PRESSURE/FFR STUDY;  Surgeon: Yvonne Kendall, MD;  Location: [facility] INVASIVE CV LAB;  Service: Cardiovascular;  Laterality: N/A;   POLYPECTOMY  09/25/2023   Procedure: POLYPECTOMY;  Surgeon: Midge Minium, MD;  Location: [facility] ENDOSCOPY;  Service: Endoscopy;;   RIGHT/LEFT HEART CATH AND CORONARY ANGIOGRAPHY N/A 05/29/2023   Procedure: RIGHT/LEFT HEART CATH AND CORONARY ANGIOGRAPHY;  Surgeon: Yvonne Kendall, MD;  Location: [facility] INVASIVE CV LAB;  Service: Cardiovascular;  Laterality: N/A;   RIGHT/LEFT HEART CATH AND CORONARY ANGIOGRAPHY Bilateral 10/30/2023   Procedure: RIGHT/LEFT HEART CATH AND CORONARY ANGIOGRAPHY;  Surgeon: Yvonne Kendall, MD;  Location: [facility] INVASIVE CV LAB;  Service: Cardiovascular;  Laterality: Bilateral;       Inpatient Medications: Scheduled Meds:  allopurinol  400 mg Oral Daily   amLODipine  10 mg Oral Daily   aspirin EC  81 mg Oral Daily   baclofen  10 mg Oral TID   budesonide (PULMICORT) nebulizer solution  2 mg Nebulization Q12H   carvedilol  25 mg Oral BID   DULoxetine  60 mg Oral Daily   enoxaparin (LOVENOX) injection  50 mg Subcutaneous Q24H  ezetimibe  10 mg Oral Daily   [START ON 01/22/2024] furosemide  40 mg Intravenous Daily   insulin aspart  0-20 Units Subcutaneous TID WC   insulin aspart  0-5 Units Subcutaneous QHS   insulin glargine-yfgn  15 Units Subcutaneous Q24H   ipratropium-albuterol  3 mL Nebulization Q6H   mometasone-formoterol  2 puff Inhalation BID   pantoprazole  40 mg Oral  Daily   predniSONE  40 mg Oral Q breakfast   pregabalin  50 mg Oral BID   rosuvastatin  40 mg Oral Daily   sodium chloride flush  3 mL Intravenous Q12H   umeclidinium bromide  1 puff Inhalation Daily   Continuous Infusions:  PRN Meds: acetaminophen, ondansetron (ZOFRAN) IV, sodium chloride flush  Allergies:    Allergies  Allergen Reactions   Oxycodone Nausea And Vomiting   Entresto [Sacubitril-Valsartan] Nausea Only and Other (See Comments)    Lightheaded and dizzy    Onion Nausea And Vomiting   Nitroglycerin Itching and Rash    Social History:   Social History   Socioeconomic History   Marital status: Divorced    Spouse name: Not on file   Number of children: 0   Years of education: 9   Highest education level: 9th grade  Occupational History   Occupation: disability  Tobacco Use   Smoking status: Some Days    Current packs/day: 0.00    Average packs/day: 1 pack/day for 37.0 years (37.0 ttl pk-yrs)    Types: Cigarettes    Start date: 10/19/1989    Last attempt to quit: 08/20/2022    Years since quitting: 1.4   Smokeless tobacco: Never   Tobacco comments:    30+ years hx smoking 2 ppd, stopped smoking briefly in April 2022 for a surgery and then restarted smoking about 1/2 ppd last 4-5 months         5-6cigs daily- 04/10/2023  Vaping Use   Vaping status: Never Used  Substance and Sexual Activity   Alcohol use: No   Drug use: No   Sexual activity: Not Currently    Partners: Female  Other Topics Concern   Not on file  Social History Narrative   Lives with sister Cory Rivas, brother in law Cory Rivas), and his mother - Cory Rivas   Social Drivers of Health   Financial Resource Strain: High Risk (01/21/2024)   Overall Financial Resource Strain (CARDIA)    Difficulty of Paying Living Expenses: Hard  Food Insecurity: No Food Insecurity (01/21/2024)   Hunger Vital Sign    Worried About Running Out of Food in the Last Year: Never true    Ran Out of Food in the Last Year:  Never true  Transportation Needs: No Transportation Needs (01/21/2024)   PRAPARE - Administrator, Civil Service (Medical): No    Lack of Transportation (Non-Medical): No  Physical Activity: Insufficiently Active (12/06/2022)   Exercise Vital Sign    Days of Exercise per Week: 3 days    Minutes of Exercise per Session: 10 min  Stress: No Stress Concern Present (12/06/2022)   [employer] of Occupational Health - Occupational Stress Questionnaire    Feeling of Stress : Only a little  Social Connections: Socially Isolated (12/06/2022)   Social Connection and Isolation Panel [NHANES]    Frequency of Communication with Friends and Family: More than three times a week    Frequency of Social Gatherings with Friends and Family: Twice a week    Attends Religious Services: Never  Active Member of Clubs or Organizations: No    Attends Banker Meetings: Never    Marital Status: Divorced  Catering manager Violence: Not At Risk (01/20/2024)   Humiliation, Afraid, Rape, and Kick questionnaire    Fear of Current or Ex-Partner: No    Emotionally Abused: No    Physically Abused: No    Sexually Abused: No    Family History:    Family History  Problem Relation Age of Onset   Heart disease Mother    Diabetes Mother    Hyperlipidemia Mother    Hypertension Mother    Heart attack Mother    Lung cancer Father    Hypertension Father    Stroke Father    AAA (abdominal aortic aneurysm) Maternal Grandmother    Heart attack Maternal Grandmother    Diabetes Maternal Grandfather      ROS:  Please see the history of present illness.   Physical Exam/Data:   Vitals:   01/21/24 1019 01/21/24 1153 01/21/24 1200 01/21/24 1300  BP: (!) 157/78  134/74 (!) 145/81  Pulse:   68 64  Resp:   (!) 23 20  Temp:  97.9 F (36.6 C)    TempSrc:  Oral    SpO2:   94% 95%  Weight:      Height:        Intake/Output Summary (Last 24 hours) at 01/21/2024 1440 Last data filed at 01/21/2024  1351 Gross per 24 hour  Intake 693 ml  Output 2700 ml  Net -2007 ml      01/20/2024   11:28 AM 01/20/2024    5:28 AM 01/15/2024    2:45 AM  Last 3 Weights  Weight (lbs) 231 lb 230 lb 230 lb  Weight (kg) 104.781 kg 104.327 kg 104.327 kg     Body mass index is 45.11 kg/m.  General:  Well nourished, well developed, in no acute distress HEENT: normal Neck: no JVD Vascular: No carotid bruits; Distal pulses 2+ bilaterally Cardiac:  normal S1, S2; RRR; no murmur  Lungs: Reduced breath sounds with scattered wheezing Abd: soft, nontender, no hepatomegaly  Ext: no edema Skin: warm and dry  Psych:  Normal affect   EKG:  The EKG was personally reviewed and demonstrates:  Sinus rhythm with inferolateral T wave inversions, unchanged from prior Telemetry:  Telemetry was personally reviewed and demonstrates:  Sinus rhythm  Relevant CV Studies:  11/12/2023 Echo complete 1. Left ventricular ejection fraction, by estimation, is 55 to 60%. The  left ventricle has normal function. The left ventricle has no regional  wall motion abnormalities. Left ventricular diastolic parameters were  normal. The average left ventricular  global longitudinal strain is -13.3 %.   2. Right ventricular systolic function is normal. The right ventricular  size is normal. Tricuspid regurgitation signal is inadequate for assessing  PA pressure.   3. The mitral valve is normal in structure. Mild mitral valve  regurgitation. No evidence of mitral stenosis.   4. The aortic valve is normal in structure. There is mild calcification  of the aortic valve. Aortic valve regurgitation is not visualized. Aortic  valve sclerosis is present, with no evidence of aortic valve stenosis.   5. The inferior vena cava is normal in size with greater than 50%  respiratory variability, suggesting right atrial pressure of 3 mmHg.   10/30/2023 R/LHC Moderate LAD and OM1 disease, not significantly different compared to prior catheterization  in 05/2023.  RFR of sequential ostial and proximal LAD stenosis  at that time was not hemodynamically significant. Normal left and right heart filling pressures. Mild pulmonary hypertension. Normal Fick cardiac output/index.  Laboratory Data:  High Sensitivity Troponin:   Recent Labs  Lab 12/29/23 0242 01/15/24 0247 01/15/24 0453 01/20/24 0600 01/20/24 0750  TROPONINIHS 22* 22* 25* 21* 21*     Chemistry Recent Labs  Lab 01/15/24 0247 01/20/24 0600 01/21/24 0529  NA 137 139 137  K 3.8 3.7 5.1  CL 103 104 103  CO2 24 25 26   GLUCOSE 178* 157* 167*  BUN 17 12 24*  CREATININE 1.31* 1.45* 1.61*  CALCIUM 8.6* 8.2* 8.6*  GFRNONAA >60 57* 50*  ANIONGAP 10 10 8     Recent Labs  Lab 01/15/24 0247  PROT 6.8  ALBUMIN 3.7  AST 20  ALT 16  ALKPHOS 51  BILITOT 0.5   Lipids No results for input(s): "CHOL", "TRIG", "HDL", "LABVLDL", "LDLCALC", "CHOLHDL" in the last 168 hours.  Hematology Recent Labs  Lab 01/15/24 0247 01/20/24 0600  WBC 11.0* 9.7  RBC 4.94 4.82  HGB 14.4 13.9  HCT 42.8 42.5  MCV 86.6 88.2  MCH 29.1 28.8  MCHC 33.6 32.7  RDW 13.3 13.1  PLT 203 195   Thyroid No results for input(s): "TSH", "FREET4" in the last 168 hours.  BNP Recent Labs  Lab 01/15/24 0247 01/20/24 0600  BNP 117.8* 152.3*    DDimer  Recent Labs  Lab 01/15/24 0636  DDIMER 0.29     Radiology/Studies:  DG Chest 1 View Result Date: 01/21/2024 IMPRESSION: 1. Interval improvement in previous pulmonary edema pattern. 2. COPD. Electronically Signed   By: Signa Kell M.D.   On: 01/21/2024 08:22   US RENAL Result Date: 01/20/2024 IMPRESSION: Negative. Electronically Signed   By: Corlis Leak M.D.   On: 01/20/2024 09:01   DG Chest Portable 1 View Result Date: 01/20/2024 IMPRESSION: 1. New increase interstitial markings compatible with pulmonary edema. 2. Chronic coarsened interstitial markings with bronchial wall thickening compatible with history of COPD. Electronically Signed   By:  Signa Kell M.D.   On: 01/20/2024 06:35   Assessment and Plan:   Acute on chronic HFpEF - Longstanding history of HFpEF - Presented 4/7 with progressive dyspnea on exertion and gradual weight gain of 10 pounds over the last few weeks - Most recent echo 10/2023 with EF 55 to 60%, no RWMA, normal diastolic parameters, normal RV systolic function and size, mild MR, aortic sclerosis without stenosis, RA pressure 3 mmHg - Recent R/LHC 10/2023 with normal right and left heart filling pressures, mild pulmonary hypertension - Received IV Lasix 40 mg x 3 with -1.5 L output since admission - Creatinine and BUN both trending up today - Volume status appears to be improving - Continue IV Lasix 40 mg twice daily for now - Continue to monitor kidney function, strict I/Os, daily weights with ongoing diuresis - Lisinopril and spironolactone held with worsening kidney function - Continue amlodipine 10 mg daily and carvedilol 25 mg twice daily  COPD exacerbation - Patient with scattered wheezing on exam - Suspect that this is contributing significantly to his symptoms - Steroids, antitussives, and bronchodilator therapy per IM  Acute hypoxic respiratory failure - Suspect this is multifactorial, including HFpEF and COPD exacerbation - Currently requiring 2 L supplemental oxygen  CAD - No symptoms of exertional chest pain or cardiac decompensation - Troponin minimally elevated, near baseline for patient - EKG without acute ischemic changes - Continue aspirin 81 mg daily, ezetimibe 10 mg daily,  and rosuvastatin 40 mg daily - No indication for further ischemic evaluation at this time  OSA - Patient no longer using CPAP at night due to insurance issues - Untreated OSA likely contributing to his symptoms  For questions or updates, please contact [facility] Please consult [email or web address] for contact info under    Signed, Orion Crook, PA-C  01/21/2024 2:40 PM

## 2024-01-21 NOTE — Progress Notes (Signed)
 Heart Failure Nurse Navigator Progress Note  PCP: Danelle Berry, PA-C PCP-Cardiologist: Debbe Odea Admission Diagnosis: COPD exacerbation (HCC) Pleuritic chest pain Acute respiratory failure with hypoxia (HCC) Acute pulmonary edema [facility]) Admitted from: Home  Presentation:   Cory Rivas presented with shortness of breath, wheezing, cough, subjective fevers, pleuritic chest pain.  Recent admission for the same symptoms.  BNP 152.3. Chest X-ray-pulmonary edema, bronchial wall thickening compatible with history of COPD.  ECHO/ LVEF: 55-60% -11/12/2023  Clinical Course:  Past Medical History:  Diagnosis Date   Bulging of cervical intervertebral disc    CHF (congestive heart failure) (HCC)    COPD (chronic obstructive pulmonary disease) (HCC)    Diabetes mellitus without complication (HCC)    Gout    High cholesterol    Hypertension    Stress due to family tension 04/15/2019     Social History   Socioeconomic History   Marital status: Divorced    Spouse name: Not on file   Number of children: 0   Years of education: 9   Highest education level: 9th grade  Occupational History   Occupation: disability  Tobacco Use   Smoking status: Some Days    Current packs/day: 0.00    Average packs/day: 1 pack/day for 37.0 years (37.0 ttl pk-yrs)    Types: Cigarettes    Start date: 10/19/1989    Last attempt to quit: 08/20/2022    Years since quitting: 1.4   Smokeless tobacco: Never   Tobacco comments:    30+ years hx smoking 2 ppd, stopped smoking briefly in April 2022 for a surgery and then restarted smoking about 1/2 ppd last 4-5 months         5-6cigs daily- 04/10/2023  Vaping Use   Vaping status: Never Used  Substance and Sexual Activity   Alcohol use: No   Drug use: No   Sexual activity: Not Currently    Partners: Female  Other Topics Concern   Not on file  Social History Narrative   Lives with sister [name], brother in law [name]), and his mother - Wadie Liew    Social Drivers of Health   Financial Resource Strain: High Risk (01/21/2024)   Overall Financial Resource Strain (CARDIA)    Difficulty of Paying Living Expenses: Hard  Food Insecurity: No Food Insecurity (01/21/2024)   Hunger Vital Sign    Worried About Running Out of Food in the Last Year: Never true    Ran Out of Food in the Last Year: Never true  Transportation Needs: No Transportation Needs (01/21/2024)   PRAPARE - Administrator, Civil Service (Medical): No    Lack of Transportation (Non-Medical): No  Physical Activity: Insufficiently Active (12/06/2022)   Exercise Vital Sign    Days of Exercise per Week: 3 days    Minutes of Exercise per Session: 10 min  Stress: No Stress Concern Present (12/06/2022)   [employer] of Occupational Health - Occupational Stress Questionnaire    Feeling of Stress : Only a little  Social Connections: Socially Isolated (12/06/2022)   Social Connection and Isolation Panel [NHANES]    Frequency of Communication with Friends and Family: More than three times a week    Frequency of Social Gatherings with Friends and Family: Twice a week    Attends Religious Services: Never    Database administrator or Organizations: No    Attends Banker Meetings: Never    Marital Status: Divorced   Water engineer and Provision:  Detailed education and instructions provided on heart failure disease management including the following:  Signs and symptoms of Heart Failure When to call the physician Importance of daily weights Low sodium diet Fluid restriction Medication management Anticipated future follow-up appointments  Patient education given on each of the above topics.  Patient acknowledges understanding via teach back method and acceptance of all instructions.  Education Materials:  "Living Better With Heart Failure" Booklet, HF zone tool, & Daily Weight Tracker Tool.  Patient has scale at home: Yes Patient has pill box  at home: His pharmacy prepares pill packets    High Risk Criteria for Readmission and/or Poor Patient Outcomes: Heart failure hospital admissions (last 6 months): 0  No Show rate: 3% Difficult social situation: None Demonstrates medication adherence: Yes Primary Language: English Literacy level: Reading, Writing, & Comprehension  Barriers of Care:   Financial Concerns  Considerations/Referrals:   Referral made to Heart Failure Pharmacist Stewardship: Yes Referral made to Heart Failure CSW/NCM TOC: Yes-Financial Concerns Referral made to Heart & Vascular TOC clinic :An established patient appointment scheduled. Pt aware of date and time.  Items for Follow-up on DC/TOC: Daily Weights Diet & Fluid Restrictions Continued Heart Failure Education  Roxy Horseman, RN, BSN [facility] Heart Failure Navigator Secure Chat Only

## 2024-01-21 NOTE — Telephone Encounter (Signed)
 Patient Product/process development scientist completed.    The patient is insured through [facility].     Ran test claim for Jardiance 10 mg and the current 30 day co-pay is $4.00.  Ran test claim for Jardiance 25 mg and the current 30 day co-pay is $4.00.  Ran test claim for Trulicity 0.25 mg and the current 30 day co-pay is $4.00.  Ran test claim for Farxiga 10 mg and Requires Prior Authorization   This test claim was processed through [employer]- copay amounts may vary at other pharmacies due to [employer], or as the patient moves through the different stages of their insurance plan.     Cory Rivas, CPHT Pharmacy Technician III Certified Patient Advocate [facility] Patient Advocate Team Direct Number: [phone]  Fax: [phone]

## 2024-01-21 NOTE — ED Notes (Signed)
 Pt to XR

## 2024-01-21 NOTE — Transitions of Care (Post Inpatient/ED Visit) (Signed)
   01/21/2024  Name: Cory Rivas MRN: [id number] DOB: February 09, 1966  Today's TOC FU Call Status: Today's TOC FU Call Status:: Successful TOC FU Call Completed Unsuccessful Call (1st Attempt) Date: 01/16/24 [facility] FU Call Complete Date: 01/21/24 Patient's Name and Date of Birth confirmed.  Transition Care Management Follow-up Telephone Call Date of Discharge: 01/15/24 Discharge Facility: [facility]) Type of Discharge: Emergency Department Reason for ED Visit: Other: (pleurodynia) How have you been since you were released from the hospital?: Same Any questions or concerns?: No  Items Reviewed: Did you receive and understand the discharge instructions provided?: Yes Medications obtained,verified, and reconciled?: Yes (Medications Reviewed) Any new allergies since your discharge?: No Dietary orders reviewed?: Yes Do you have support at home?: No  Medications Reviewed Today: Medications Reviewed Today   Medications were not reviewed in this encounter     Home Care and Equipment/Supplies: Were Home Health Services Ordered?: NA Any new equipment or medical supplies ordered?: NA  Functional Questionnaire: Do you need assistance with bathing/showering or dressing?: No Do you need assistance with meal preparation?: No Do you need assistance with eating?: No Do you have difficulty maintaining continence: No Do you need assistance with getting out of bed/getting out of a chair/moving?: No Do you have difficulty managing or taking your medications?: No  Follow up appointments reviewed: PCP Follow-up appointment confirmed?: No (patient back in ER) MD Provider Line Number:[phone] Given: No Specialist Hospital Follow-up appointment confirmed?: NA Do you need transportation to your follow-up appointment?: No Do you understand care options if your condition(s) worsen?: Yes-patient verbalized understanding    SIGNATURE Karena Addison, LPN [facility] Nurse  Health Advisor Direct Dial [phone]

## 2024-01-21 NOTE — ED Notes (Signed)
 Assumed patient care and received report from the previous nurse at approximately 0710

## 2024-01-21 NOTE — Progress Notes (Signed)
 Heart Failure Stewardship Pharmacy Note  PCP: Danelle Berry, PA-C PCP-Cardiologist: Debbe Odea, MD  HPI: Cory Rivas is a 58 y.o. male with CAD, chronic HFpEF, COPD Gold stage II, HTN, HLD, gout, CKD stage II who presented with worsening of cough, wheezing, and shortness of breath over the past week and a 10 lb weight gain in the last month. Reports his diuretic was less effective after his most recent hospitalization. On admission, BNP was 152.3, HS-troponin was 21, and uric acid was 9.3. Chest x-ray noted pulmonary edema and COPD.    Pertinent cardiac history: Stress test in 09/2020 was low risk, LVEF was 59%. Echo 10/2020 with LVEF 60-65% and grade I diastolic dysfunction. Echo 08/2022 with LVEF 60-65%, mild LVH, grade II diastolic dysfunction. Echo 05/2023 with LVEF 50-55%, moderate LVH, grade III diastolic fysfunction. LHC 05/2023 noted moderate nonobstructive CAD. RHC 05/2023 showed RA 7, PA 45/16 (26), PCWP 18, CI 2.4, PVR 1.6 WU. LHC 10/2023 with similar nonobstructive CAD. RHC 10/2023 with PA 39/14 (22), PCWP 11, CI 2.5, PVR 2.1 WU.  Pertinent Lab Values: Creat  Date Value Ref Range Status  11/01/2023 1.35 (H) 0.70 - 1.30 mg/dL Final   Creatinine, Ser  Date Value Ref Range Status  01/21/2024 1.61 (H) 0.61 - 1.24 mg/dL Final   BUN  Date Value Ref Range Status  01/21/2024 24 (H) 6 - 20 mg/dL Final  16/07/9603 17 6 - 24 mg/dL Final   Potassium  Date Value Ref Range Status  01/21/2024 5.1 3.5 - 5.1 mmol/L Final   Sodium  Date Value Ref Range Status  01/21/2024 137 135 - 145 mmol/L Final  11/15/2023 142 134 - 144 mmol/L Final   Brain Natriuretic Peptide  Date Value Ref Range Status  07/01/2020 66 <100 pg/mL Final    Comment:    . BNP levels increase with age in the general population with the highest values seen in individuals greater than 22 years of age. Reference: J. Am. Ladon Applebaum. Cardiol. 2002; 54:098-119. .    B Natriuretic Peptide  Date Value Ref Range  Status  01/20/2024 152.3 (H) 0.0 - 100.0 pg/mL Final    Comment:    Performed at [facility], [address] [place], [place] [address]   Magnesium  Date Value Ref Range Status  10/24/2023 2.2 1.7 - 2.4 mg/dL Final    Comment:    Performed at [facility], [address] [place], [place] [address]   Hemoglobin A1C  Date Value Ref Range Status  11/28/2023 7.9 (A) 4.0 - 5.6 % Final  01/02/2022 6.5  Final   Hgb A1c MFr Bld  Date Value Ref Range Status  08/09/2023 9.8 (H) <5.7 % of total Hgb Final    Comment:    For someone without known diabetes, a hemoglobin A1c value of 6.5% or greater indicates that they may have  diabetes and this should be confirmed with a follow-up  test. . For someone with known diabetes, a value <7% indicates  that their diabetes is well controlled and a value  greater than or equal to 7% indicates suboptimal  control. A1c targets should be individualized based on  duration of diabetes, age, comorbid conditions, and  other considerations. . Currently, no consensus exists regarding use of hemoglobin A1c for diagnosis of diabetes for children. .    TSH  Date Value Ref Range Status  12/18/2017 1.86 0.41 - 5.90 Final    Vital Signs: Admission weight: 231 lbs Temp:  [98.2 F (  36.8 C)-98.4 F (36.9 C)] 98.2 F (36.8 C) (04/08 0651) Pulse Rate:  [71-101] 71 (04/08 0400) Cardiac Rhythm: Normal sinus rhythm (04/07 2000) Resp:  [13-29] 19 (04/08 0400) BP: (129-152)/(71-92) 130/82 (04/08 0400) SpO2:  [87 %-96 %] 94 % (04/08 0400) Weight:  [104.8 kg (231 lb)] 104.8 kg (231 lb) (04/07 1128)  Intake/Output Summary (Last 24 hours) at 01/21/2024 0734 Last data filed at 01/21/2024 0310 Gross per 24 hour  Intake 693 ml  Output 1150 ml  Net -457 ml    Current Heart Failure Medications:  Loop diuretic: furosemide 40 mg IV BID Beta-Blocker: carvedilol 25 mg BID ACEI/ARB/ARNI: none MRA: spironolactone 25 mg daily SGLT2i:  none Other: amlodipine 10 mg daily  Prior to admission Heart Failure Medications:  Loop diuretic: torsemide 20 mg BID Beta-Blocker: carvedilol 25 mg BID ACEI/ARB/ARNI: lisinopril 40 mg daily MRA: spironolactone 25 mg daily SGLT2i: Jardiance 10 mg daily Other: amlodipine 10 mg daily (Antianginal)  Assessment: 1. Acute on chronic diastolic heart failure (LVEF 50-55%) with grade III diastolic dysfunction, due to NICM. NYHA class III-IV symptoms.  -Symptoms: Reports shortness of breath is much improved after bronchodilators, steroids, and diuresis. Reports appetite is fair. Denies LEE. -Volume: Volume difficult to assess. Creatinine is trending up along with BUN on furosemide 40 mg IV BID. No weight charted today. Reports dry weight was ~221 lbs a months prior. -Hemodynamics: BP remains elevated. HR 60-70s. -SGLT2i: Consider resuming Jardiance 10 mg daily -MRA: Spironolactone held today due to K of 5.1. Would not resume until trending down. If potassium is hypersensitive to spironolactone, could try finerenone outpatient.  -ARNI: Previously did not tolerate Entresto. Lisinopril is currently being held. Can consider resuming when creatinine is trending down. -Given COPD, would opt for transitioning carvedilol to bisoprolol to decrease tendency for bronchospasm. Chest pain appears to be pleuritic. -Consider GLP-1RA outpatient  Plan: 1) Medication changes recommended at this time: -Consider transition from carvedilol to bisoprolol 5 mg daily  2) Patient assistance: -Pending  3) Education: - Patient has been educated on current HF medications and potential additions to HF medication regimen - Patient verbalizes understanding that over the next few months, these medication doses may change and more medications may be added to optimize HF regimen - Patient has been educated on basic disease state pathophysiology and goals of therapy  Medication Assistance / Insurance Benefits Check: Does the  patient have prescription insurance?    Type of insurance plan:  Does the patient qualify for medication assistance through manufacturers or grants? Pending   Outpatient Pharmacy: Prior to admission outpatient pharmacy: [facility]      Please do not hesitate to reach out with questions or concerns,  Enos Fling, PharmD, CPP, BCPS Heart Failure Pharmacist  Phone - [phone] 01/21/2024 12:01 PM

## 2024-01-22 DIAGNOSIS — J441 Chronic obstructive pulmonary disease with (acute) exacerbation: Secondary | ICD-10-CM | POA: Diagnosis not present

## 2024-01-22 DIAGNOSIS — J810 Acute pulmonary edema: Secondary | ICD-10-CM

## 2024-01-22 DIAGNOSIS — I10 Essential (primary) hypertension: Secondary | ICD-10-CM

## 2024-01-22 DIAGNOSIS — J9601 Acute respiratory failure with hypoxia: Secondary | ICD-10-CM

## 2024-01-22 DIAGNOSIS — I5033 Acute on chronic diastolic (congestive) heart failure: Secondary | ICD-10-CM | POA: Diagnosis not present

## 2024-01-22 LAB — BASIC METABOLIC PANEL WITH GFR
Anion gap: 9 (ref 5–15)
Anion gap: 7 (ref 5–15)
BUN: 41 mg/dL — ABNORMAL HIGH (ref 6–20)
BUN: 45 mg/dL — ABNORMAL HIGH (ref 6–20)
CO2: 26 mmol/L (ref 22–32)
CO2: 29 mmol/L (ref 22–32)
Calcium: 8.6 mg/dL — ABNORMAL LOW (ref 8.9–10.3)
Calcium: 8.5 mg/dL — ABNORMAL LOW (ref 8.9–10.3)
Chloride: 101 mmol/L (ref 98–111)
Chloride: 99 mmol/L (ref 98–111)
Creatinine, Ser: 1.61 mg/dL — ABNORMAL HIGH (ref 0.61–1.24)
Creatinine, Ser: 1.71 mg/dL — ABNORMAL HIGH (ref 0.61–1.24)
GFR, Estimated: 50 mL/min — ABNORMAL LOW (ref >60)
GFR, Estimated: 46 mL/min — ABNORMAL LOW (ref >60)
Glucose, Bld: 120 mg/dL — ABNORMAL HIGH (ref 70–99)
Glucose, Bld: 136 mg/dL — ABNORMAL HIGH (ref 70–99)
Potassium: 4 mmol/L (ref 3.5–5.1)
Potassium: 4.3 mmol/L (ref 3.5–5.1)
Sodium: 136 mmol/L (ref 135–145)
Sodium: 135 mmol/L (ref 135–145)

## 2024-01-22 LAB — GLUCOSE, CAPILLARY
Glucose-Capillary: 114 mg/dL — ABNORMAL HIGH (ref 70–99)
Glucose-Capillary: 138 mg/dL — ABNORMAL HIGH (ref 70–99)
Glucose-Capillary: 240 mg/dL — ABNORMAL HIGH (ref 70–99)
Glucose-Capillary: 69 mg/dL — ABNORMAL LOW (ref 70–99)
Glucose-Capillary: 162 mg/dL — ABNORMAL HIGH (ref 70–99)

## 2024-01-22 MED ORDER — TORSEMIDE 20 MG PO TABS
20.0000 mg | ORAL_TABLET | Freq: Two times a day (BID) | ORAL | Status: DC
  Administered 2024-01-23: 20 mg via ORAL
  Filled 2024-01-22: qty 1

## 2024-01-22 MED ORDER — NICOTINE 21 MG/24HR TD PT24
21.0000 mg | MEDICATED_PATCH | Freq: Every day | TRANSDERMAL | Status: DC
  Administered 2024-01-22 – 2024-01-23 (×2): 21 mg via TRANSDERMAL
  Filled 2024-01-22 (×2): qty 1

## 2024-01-22 MED ORDER — BISOPROLOL FUMARATE 5 MG PO TABS
5.0000 mg | ORAL_TABLET | Freq: Two times a day (BID) | ORAL | Status: DC
  Administered 2024-01-23: 5 mg via ORAL
  Filled 2024-01-22: qty 1

## 2024-01-22 MED ORDER — BUDESONIDE 0.5 MG/2ML IN SUSP
0.5000 mg | Freq: Two times a day (BID) | RESPIRATORY_TRACT | Status: DC
  Administered 2024-01-22 – 2024-01-23 (×2): 0.5 mg via RESPIRATORY_TRACT
  Filled 2024-01-22 (×2): qty 2

## 2024-01-22 NOTE — Progress Notes (Signed)
 SATURATION QUALIFICATIONS: (This note is used to comply with regulatory documentation for home oxygen)  Patient Saturations on Room Air at Rest = 93%  Patient Saturations on Room Air while Ambulating = 88%  Patient Saturations on 1 Liters of oxygen while Ambulating = 95%  Please briefly explain why patient needs home oxygen: Patient's saturations drop to 88% room air while ambulating. He experienced some mild SHOB

## 2024-01-22 NOTE — Plan of Care (Signed)
 Nutrition Education Note  RD consulted for nutrition education regarding CHF and diabetes.  Lab Results  Component Value Date   HGBA1C 7.9 (A) 11/28/2023    SPoke with pt, who was sitting in recliner chair at time of visit. Pt reports feeling well and with good appetite. Pt shares he consumes 3 meals and a snack daily. Pt generally eats fruit and sugar free popsicles for snacks and meals consist of sandwiches and meat, starch, and vegetables.   Pt weighs himself daily and also uses a Dexcom to blood sugar management. Pt reports improved CBG control with Dexcom. RD reviewed heart healthy, carb modified diet and importance of daily weights. Pt is followed by the [facility] Failure [facility] and is looking forward to appointment next week.   RD provided "Heart Healthy, Consistent Carbohydrate Nutrition Therapy" handout from the Academy of [employer] and Dietetics. Reviewed patient's dietary recall. Provided examples on ways to decrease sodium intake in diet. Discouraged intake of processed foods and use of salt shaker. Encouraged fresh fruits and vegetables as well as whole grain sources of carbohydrates to maximize fiber intake.   RD discussed why it is important for patient to adhere to diet recommendations, and emphasized the role of fluids, foods to avoid, and importance of weighing self daily.   Discussed different food groups and their effects on blood sugar, emphasizing carbohydrate-containing foods. Provided list of carbohydrates and recommended serving sizes of common foods.  Discussed importance of controlled and consistent carbohydrate intake throughout the day. Provided examples of ways to balance meals/snacks and encouraged intake of high-fiber, whole grain complex carbohydrates. Teach back method used.  Expect fair compliance.  Current diet order is heart healthy, carb modified diet with 1.8 L fluid restriction, patient is consuming approximately 100% of meals at this time. Labs and  medications reviewed. No further nutrition interventions warranted at this time. RD contact information provided. If additional nutrition issues arise, please re-consult RD.   Levada Schilling, RD, LDN, CDCES Registered Dietitian III Certified Diabetes Care and Education Specialist If unable to reach this RD, please use "RD Inpatient" group chat on secure chat between hours of 8am-4 pm daily

## 2024-01-22 NOTE — Progress Notes (Signed)
 Rounding Note    Patient Name: Cory Rivas Date of Encounter: 01/22/2024  [facility] Cardiologist: Debbe Odea, MD   Subjective   Patient reports feeling back to baseline, with improvements in breathing and swelling. Kidney function is trending up. Blood pressure stable. Recommend weaning supplemental O2.   Inpatient Medications    Scheduled Meds:  allopurinol  400 mg Oral Daily   amLODipine  10 mg Oral Daily   aspirin EC  81 mg Oral Daily   baclofen  10 mg Oral TID   [START ON 01/23/2024] bisoprolol  5 mg Oral BID   budesonide (PULMICORT) nebulizer solution  0.5 mg Nebulization Q12H   DULoxetine  60 mg Oral Daily   enoxaparin (LOVENOX) injection  50 mg Subcutaneous Q24H   ezetimibe  10 mg Oral Daily   insulin aspart  0-20 Units Subcutaneous TID WC   insulin aspart  0-5 Units Subcutaneous QHS   insulin glargine-yfgn  15 Units Subcutaneous Q24H   ipratropium-albuterol  3 mL Nebulization TID   mometasone-formoterol  2 puff Inhalation BID   nicotine  21 mg Transdermal Daily   pantoprazole  40 mg Oral Daily   predniSONE  40 mg Oral Q breakfast   pregabalin  50 mg Oral BID   rosuvastatin  40 mg Oral Daily   sodium chloride flush  3 mL Intravenous Q12H   [START ON 01/23/2024] torsemide  20 mg Oral BID   umeclidinium bromide  1 puff Inhalation Daily   Continuous Infusions:  PRN Meds: acetaminophen, ondansetron (ZOFRAN) IV, sodium chloride flush   Vital Signs    Vitals:   01/22/24 0254 01/22/24 0403 01/22/24 0753 01/22/24 1141  BP:  111/79 128/81 102/66  Pulse:  (!) 55 (!) 58 (!) 56  Resp:  18 18 16   Temp:  98.2 F (36.8 C) 97.8 F (36.6 C) 97.9 F (36.6 C)  TempSrc:  Oral Oral   SpO2:  97% 94% 94%  Weight: 98.2 kg     Height:        Intake/Output Summary (Last 24 hours) at 01/22/2024 1458 Last data filed at 01/22/2024 0900 Gross per 24 hour  Intake 250 ml  Output 650 ml  Net -400 ml      01/22/2024    2:54 AM 01/21/2024    8:01 PM 01/20/2024    11:28 AM  Last 3 Weights  Weight (lbs) 216 lb 7.9 oz 216 lb 8 oz 231 lb  Weight (kg) 98.2 kg 98.204 kg 104.781 kg      Telemetry    Sinus rhythm - Personally Reviewed  Physical Exam   GEN: Morbidly obese. No acute distress.   Neck: JVD difficult to assess Cardiac: RRR, no murmurs, rubs, or gallops.  Respiratory: Mildly reduced breath sounds throughout GI: Soft, nontender, non-distended  MS: No edema; No deformity. Neuro:  Nonfocal  Psych: Normal affect   Labs    High Sensitivity Troponin:   Recent Labs  Lab 12/29/23 0242 01/15/24 0247 01/15/24 0453 01/20/24 0600 01/20/24 0750  TROPONINIHS 22* 22* 25* 21* 21*     Chemistry Recent Labs  Lab 01/21/24 0529 01/22/24 0510 01/22/24 1309  NA 137 136 135  K 5.1 4.0 4.3  CL 103 101 99  CO2 26 26 29   GLUCOSE 167* 120* 136*  BUN 24* 41* 45*  CREATININE 1.61* 1.61* 1.71*  CALCIUM 8.6* 8.6* 8.5*  GFRNONAA 50* 50* 46*  ANIONGAP 8 9 7     Lipids No results for input(s): "CHOL", "  TRIG", "HDL", "LABVLDL", "LDLCALC", "CHOLHDL" in the last 168 hours.  Hematology Recent Labs  Lab 01/20/24 0600  WBC 9.7  RBC 4.82  HGB 13.9  HCT 42.5  MCV 88.2  MCH 28.8  MCHC 32.7  RDW 13.1  PLT 195   Thyroid No results for input(s): "TSH", "FREET4" in the last 168 hours.  BNP Recent Labs  Lab 01/20/24 0600  BNP 152.3*    DDimer No results for input(s): "DDIMER" in the last 168 hours.   Radiology    DG Chest Portable 2 Views Result Date: 01/21/2024 IMPRESSION: 1. Lower lung volumes from earlier today. Increase in peribronchial thickening, may be pulmonary edema or bronchitis. 2. Ill-defined opacity at the lung bases, favor atelectasis. Electronically Signed   By: Narda Rutherford M.D.   On: 01/21/2024 17:24   DG Chest 1 View Result Date: 01/21/2024 IMPRESSION: 1. Interval improvement in previous pulmonary edema pattern. 2. COPD. Electronically Signed   By: Signa Kell M.D.   On: 01/21/2024 08:22    Cardiac Studies    11/12/2023 Echo complete 1. Left ventricular ejection fraction, by estimation, is 55 to 60%. The  left ventricle has normal function. The left ventricle has no regional  wall motion abnormalities. Left ventricular diastolic parameters were  normal. The average left ventricular  global longitudinal strain is -13.3 %.   2. Right ventricular systolic function is normal. The right ventricular  size is normal. Tricuspid regurgitation signal is inadequate for assessing  PA pressure.   3. The mitral valve is normal in structure. Mild mitral valve  regurgitation. No evidence of mitral stenosis.   4. The aortic valve is normal in structure. There is mild calcification  of the aortic valve. Aortic valve regurgitation is not visualized. Aortic  valve sclerosis is present, with no evidence of aortic valve stenosis.   5. The inferior vena cava is normal in size with greater than 50%  respiratory variability, suggesting right atrial pressure of 3 mmHg.    10/30/2023 R/LHC Moderate LAD and OM1 disease, not significantly different compared to prior catheterization in 05/2023.  RFR of sequential ostial and proximal LAD stenosis at that time was not hemodynamically significant. Normal left and right heart filling pressures. Mild pulmonary hypertension. Normal Fick cardiac output/index.  Patient Profile     Cory Rivas is a 57 y.o. male with a hx of nonobstructive CAD, HFpEF, CKD stage III AAA, IDDM, hypertension, hyperlipidemia, cervical myelopathy, idiopathic chronic gout, tobacco use for 35+ years, asthma, COPD, obesity with OSA and tracheobronchomalacia who is being seen for the continued evaluation of acute on chronic HFpEF.   Assessment & Plan    Acute on chronic HFpEF - Presented 4/7 with progressive dyspnea on exertion and gradual weight gain of 10 pounds over the last few weeks - Most recent echo 10/2023 with EF 55 to 60%, no RWMA, normal diastolic parameters, normal RV systolic function and  size, mild MR, aortic's sclerosis without stenosis, RA pressure 3 mmHg - Recent [facility] 10/2023 with normal right and left heart filling pressures, mild pulmonary hypertension - Received IV Lasix 40 mg twice daily with -2.4 L of output since admission - Creatinine and BUN continue to trend up with diuresis - Will discontinue IV Lasix at this time with plan to resume PTA dose of torsemide tomorrow - Lisinopril and spironolactone held due to worsening kidney function and borderline hyperkalemia - Given concern surrounding COPD and preserved EF, will discontinue carvedilol at this time - Start bisoprolol 5 mg  twice daily - Continue amlodipine 10 mg daily - Consider restarting empagliflozin once renal function is stabilized  COPD exacerbation - Similar hospitalization 10/2023 with aggressive diuresis and only modest improvement in dyspnea but significantly worsening renal function. It was felt COPD as well as elements of tracheal bronchomalacia and OHS were contributing to his symptoms at that time. Likely similar scenario on this admission. - Clinically improved - Wean supplemental O2 - Steroids and bronchodilator therapy per IM  Acute hypoxic respiratory failure - Likely multifactorial in the setting of HFpEF and COPD exacerbation - Wean O2 as above  CAD - No symptoms of exertional chest pain or cardiac decompensation - Troponin minimally elevated, near baseline per patient - EKG without acute ischemic changes - Continue medical management with aspirin 81 mg daily, ezetimibe 10 mg daily, and rosuvastatin 40 mg daily - No indication for further ischemic evaluation at this time  OSA - Patient no longer using CPAP at night due to insurance issues - Untreated OSA likely contributing to symptoms  For questions or updates, please contact [facility] Please consult [email or web address] for contact info under        Signed, Orion Crook, PA-C  01/22/2024, 2:58 PM

## 2024-01-22 NOTE — Discharge Instructions (Signed)
 Heart Healthy, Consistent Carbohydrate Nutrition Therapy   A heart-healthy and consistent carbohydrate diet is recommended to manage heart disease and diabetes. To follow a heart-healthy and consistent carbohydrate diet, Eat a balanced diet with whole grains, fruits and vegetables, and lean protein sources.  Choose heart-healthy unsaturated fats. Limit saturated fats, trans fats, and cholesterol intake. Eat more plant-based or vegetarian meals using beans and soy foods for protein.  Eat whole, unprocessed foods to limit the amount of sodium (salt) you eat.  Choose a consistent amount of carbohydrate at each meal and snack. Limit refined carbohydrates especially sugar, sweets and sugar-sweetened beverages.  If you drink alcohol, do so in moderation: one serving per day (women) and two servings per day (men). o One serving is equivalent to 12 ounces beer, 5 ounces wine, or 1.5 ounces distilled spirits  Tips Tips for Choosing Heart-Healthy Fats Choose lean protein and low-fat dairy foods to reduce saturated fat intake. Saturated fat is usually found in animal-based protein and is associated with certain health risks. Saturated fat is the biggest contributor to raise low-density lipoprotein (LDL) cholesterol levels. Research shows that limiting saturated fat lowers unhealthy cholesterol levels. Eat no more than 7% of your total calories each day from saturated fat. Ask your RDN to help you determine how much saturated fat is right for you. There are many foods that do not contain large amounts of saturated fats. Swapping these foods to replace foods high in saturated fats will help you limit the saturated fat you eat and improve your cholesterol levels. You can also try eating more plant-based or vegetarian meals. Instead of. Try:  Whole milk, cheese, yogurt, and ice cream 1% or skim milk, low-fat cheese, non-fat yogurt, and low-fat ice cream  Fatty, marbled beef and pork Lean beef, pork, or venison   Poultry with skin Poultry without skin  Butter, stick margarine Reduced-fat, whipped, or liquid spreads  Coconut oil, palm oil Liquid vegetable oils: corn, canola, olive, soybean and safflower oils   Avoid foods that contain trans fats. Trans fats increase levels of LDL-cholesterol. Hydrogenated fat in processed foods is the main source of trans fats in foods.  Trans fats can be found in stick margarine, shortening, processed sweets, baked goods, some fried foods, and packaged foods made with hydrogenated oils. Avoid foods with "partially hydrogenated oil" on the ingredient list such as: cookies, pastries, baked goods, biscuits, crackers, microwave popcorn, and frozen dinners. Choose foods with heart healthy fats. Polyunsaturated and monounsaturated fat are unsaturated fats that may help lower your blood cholesterol level when used in place of saturated fat in your diet. Ask your RDN about taking a dietary supplement with plant sterols and stanols to help lower your cholesterol level. Research shows that substituting saturated fats with unsaturated fats is beneficial to cholesterol levels. Try these easy swaps: Instead of. Try:  Butter, stick margarine, or solid shortening Reduced-fat, whipped, or liquid spreads  Beef, pork, or poultry with skin Fish and seafood  Chips, crackers, snack foods Raw or unsalted nuts and seeds or nut butters Hummus with vegetables Avocado on toast  Coconut oil, palm oil Liquid vegetable oils: corn, canola, olive, soybean and safflower oils  Limit the amount of cholesterol you eat to less than 200 milligrams per day. Cholesterol is a substance carried through the bloodstream via lipoproteins, which are known as "transporters" of fat. Some body functions need cholesterol to work properly, but too much cholesterol in the bloodstream can damage arteries and build up blood vessel linings (  which can lead to heart attack and stroke). You should eat less than 200 milligrams  cholesterol per day. People respond differently to eating cholesterol. There is no test available right now that can figure out which people will respond more to dietary cholesterol and which will respond less. For individuals with high intake of dietary cholesterol, different types of increase (none, small, moderate, large) in LDL-cholesterol levels are all possible.  Food sources of cholesterol include egg yolks and organ meats such as liver, gizzards. Limit egg yolks to two to four per week and avoid organ meats like liver and gizzards to control cholesterol intake. Tips for Choosing Heart-Healthy Carbohydrates Consume a consistent amount of carbohydrate It is important to eat foods with carbohydrates in moderation because they impact your blood glucose level. Carbohydrates can be found in many foods such as: Grains (breads, crackers, rice, pasta, and cereals)  Starchy Vegetables (potatoes, corn, and peas)  Beans and legumes  Milk, soy milk, and yogurt  Fruit and fruit juice  Sweets (cakes, cookies, ice cream, jam and jelly) Your RDN will help you set a goal for how many carbohydrate servings to eat at your meals and snacks. For many adults, eating 3 to 5 servings of carbohydrate foods at each meal and 1 or 2 carbohydrate servings for each snack works well.  Check your blood glucose level regularly. It can tell you if you need to adjust when you eat carbohydrates. Choose foods rich in viscous (soluble) fiber Viscous, or soluble, is found in the walls of plant cells. Viscous fiber is found only in plant-based foods. Eating foods with fiber helps to lower your unhealthy cholesterol and keep your blood glucose in range  Rich sources of viscous fiber include vegetables (asparagus, Brussels sprouts, sweet potatoes, turnips) fruit (apricots, mangoes, oranges), legumes, and whole grains (barley, oats, and oat bran).  As you increase your fiber intake gradually, also increase the amount of water you  drink. This will help prevent constipation.  If you have difficulty achieving this goal, ask your RDN about fiber laxatives. Choose fiber supplements made with viscous fibers such as psyllium seed husks or methylcellulose to help lower unhealthy cholesterol.  Limit refined carbohydrates  There are three types of carbohydrates: starches, sugar, and fiber. Some carbohydrates occur naturally in food, like the starches in rice or corn or the sugars in fruits and milk. Refined carbohydrates--foods with high amounts of simple sugars--can raise triglyceride levels. High triglyceride levels are associated with coronary heart disease. Some examples of refined carbohydrate foods are table sugar, sweets, and beverages sweetened with added sugar. Tips for Reducing Sodium (Salt) Although sodium is important for your body to function, too much sodium can be harmful for people with high blood pressure. As sodium and fluid buildup in your tissues and bloodstream, your blood pressure increases. High blood pressure may cause damage to other organs and increase your risk for a stroke. Even if you take a pill for blood pressure or a water pill (diuretic) to remove fluid, it is still important to have less salt in your diet. Ask your doctor and RDN what amount of sodium is right for you. Avoid processed foods. Eat more fresh foods.  Fresh fruits and vegetables are naturally low in sodium, as well as frozen vegetables and fruits that have no added juices or sauces.  Fresh meats are lower in sodium than processed meats, such as bacon, sausage, and hotdogs. Read the nutrition label or ask your butcher to help you find a  fresh meat that is low in sodium. Eat less salt--at the table and when cooking.  A single teaspoon of table salt has 2,300 mg of sodium.  Leave the salt out of recipes for pasta, casseroles, and soups.  Ask your RDN how to cook your favorite recipes without sodium Be a smart shopper.  Look for food packages  that say "salt-free" or "sodium-free." These items contain less than 5 milligrams of sodium per serving.  "Very low-sodium" products contain less than 35 milligrams of sodium per serving.  "Low-sodium" products contain less than 140 milligrams of sodium per serving.  Beware for "Unsalted" or "No Added Salt" products. These items may still be high in sodium. Check the nutrition label. Add flavors to your food without adding sodium.  Try lemon juice, lime juice, fruit juice or vinegar.  Dry or fresh herbs add flavor. Try basil, bay leaf, dill, rosemary, parsley, sage, dry mustard, nutmeg, thyme, and paprika.  Pepper, red pepper flakes, and cayenne pepper can add spice t your meals without adding sodium. Hot sauce contains sodium, but if you use just a drop or two, it will not add up to much.  Buy a sodium-free seasoning blend or make your own at home. Additional Lifestyle Tips Achieve and maintain a healthy weight. Talk with your RDN or your doctor about what is a healthy weight for you. Set goals to reach and maintain that weight.  To lose weight, reduce your calorie intake along with increasing your physical activity. A weight loss of 10 to 15 pounds could reduce LDL-cholesterol by 5 milligrams per deciliter. Participate in physical activity. Talk with your health care team to find out what types of physical activity are best for you. Set a plan to get about 30 minutes of exercise on most days.  Foods Recommended Food Group Foods Recommended  Grains Whole grain breads and cereals, including whole wheat, barley, rye, buckwheat, corn, teff, quinoa, millet, amaranth, brown or wild rice, sorghum, and oats Pasta, especially whole wheat or other whole grain types  [employer] [employer], quinoa or wild rice Whole grain crackers, bread, rolls, pitas Home-made bread with reduced-sodium baking soda  Protein Foods Lean cuts of beef and pork (loin, leg, round, extra lean hamburger)  Skinless [employer] photographer  and other wild game Dried beans and peas Nuts and nut butters Meat alternatives made with soy or textured vegetable protein  Egg whites or egg substitute Cold cuts made with lean meat or soy protein  Dairy Nonfat (skim), low-fat, or 1%-fat milk  Nonfat or low-fat yogurt or cottage cheese Fat-free and low-fat cheese  Vegetables Fresh, frozen, or canned vegetables without added fat or salt   Fruits Fresh, frozen, canned, or dried fruit   Oils Unsaturated oils (corn, olive, peanut, soy, sunflower, canola)  Soft or liquid margarines and vegetable oil spreads  Salad dressings Seeds and nuts  Avocado   Foods Not Recommended Food Group Foods Not Recommended  Grains Breads or crackers topped with salt Cereals (hot or cold) with more than 300 mg sodium per serving Biscuits, cornbread, and other "quick" breads prepared with baking soda Bread crumbs or stuffing mix from a store High-fat bakery products, such as doughnuts, biscuits, croissants, danish pastries, pies, cookies Instant cooking foods to which you add hot water and stir--potatoes, noodles, rice, etc. Packaged starchy foods--seasoned noodle or rice dishes, stuffing mix, macaroni and cheese dinner Snacks made with partially hydrogenated oils, including chips, cheese puffs, snack mixes, regular crackers, butter-flavored popcorn  Protein Foods  Higher-fat cuts of meats (ribs, t-bone steak, regular hamburger) Bacon, sausage, or hot dogs Cold cuts, such as salami or bologna, deli meats, cured meats, corned beef Organ meats (liver, brains, gizzards, sweetbreads) Poultry with skin Fried or smoked meat, poultry, and fish Whole eggs and egg yolks (more than 2-4 per week) Salted legumes, nuts, seeds, or nut/seed butters Meat alternatives with high levels of sodium (>300 mg per serving) or saturated fat (>5 g per serving)  Dairy Whole milk,?2% fat milk, buttermilk Whole milk yogurt or ice cream Cream Half-&-half Cream cheese Sour  cream Cheese  Vegetables Canned or frozen vegetables with salt, fresh vegetables prepared with salt, butter, cheese, or cream sauce Fried vegetables Pickled vegetables such as olives, pickles, or sauerkraut  Fruits Fried fruits Fruits served with butter or cream  Oils Butter, stick margarine, shortening Partially hydrogenated oils or trans fats Tropical oils (coconut, palm, palm kernel oils)  Other Candy, sugar sweetened soft drinks and desserts Salt, sea salt, garlic salt, and seasoning mixes containing salt Bouillon cubes Ketchup, barbecue sauce, Worcestershire sauce, soy sauce, teriyaki sauce Miso Salsa Pickles, olives, relish   Heart Healthy Consistent Carbohydrate Vegetarian (Lacto-Ovo) Sample 1-Day Menu  Breakfast 1 cup oatmeal, cooked (2 carbohydrate servings)   cup blueberries (1 carbohydrate serving)  11 almonds, without salt  1 cup 1% milk (1 carbohydrate serving)  1 cup coffee  Morning Snack 1 cup fat-free plain yogurt (1 carbohydrate serving)  Lunch 1 whole wheat bun (1 carbohydrate servings)  1 black bean burger (1 carbohydrate servings)  1 slice cheddar cheese, low sodium  2 slices tomatoes  2 leaves lettuce  1 teaspoon mustard  1 small pear (1 carbohydrate servings)  1 cup green tea, unsweetened  Afternoon Snack 1/3 cup trail mix with nuts, seeds, and raisins, without salt (1 carbohydrate servinga)  Evening Meal  cup meatless chicken  2/3 cup brown rice, cooked (2 carbohydrate servings)  1 cup broccoli, cooked (2/3 carbohydrate serving)   cup carrots, cooked (1/3 carbohydrate serving)  2 teaspoons olive oil  1 teaspoon balsamic vinegar  1 whole wheat dinner roll (1 carbohydrate serving)  1 teaspoon margarine, soft, tub  1 cup 1% milk (1 carbohydrate serving)  Evening Snack 1 extra small banana (1 carbohydrate serving)  1 tablespoon peanut butter   Heart Healthy Consistent Carbohydrate Vegan Sample 1-Day Menu  Breakfast 1 cup oatmeal, cooked (2  carbohydrate servings)   cup blueberries (1 carbohydrate serving)  11 almonds, without salt  1 cup soymilk fortified with calcium, vitamin B12, and vitamin D  1 cup coffee  Morning Snack 6 ounces soy yogurt (1 carbohydrate servings)  Lunch 1 whole wheat bun(1 carbohydrate servings)  1 black bean burger (1 carbohydrate serving)  2 slices tomatoes  2 leaves lettuce  1 teaspoon mustard  1 small pear (1 carbohydrate servings)  1 cup green tea, unsweetened  Afternoon Snack 1/3 cup trail mix with nuts, seeds, and raisins, without salt (1 carbohydrate servings)  Evening Meal  cup meatless chicken  2/3 cup brown rice, cooked (2 carbohydrate servings)  1 cup broccoli, cooked (2/3 carbohydrate serving)   cup carrots, cooked (1/3 carbohydrate serving)  2 teaspoons olive oil  1 teaspoon balsamic vinegar  1 whole wheat dinner roll (1 carbohydrate serving)  1 teaspoon margarine, soft, tub  1 cup soymilk fortified with calcium, vitamin B12, and vitamin D  Evening Snack 1 extra small banana (1 carbohydrate serving)  1 tablespoon peanut butter    Heart Healthy Consistent Carbohydrate Sample  1-Day Menu  Breakfast 1 cup cooked oatmeal (2 carbohydrate servings)  3/4 cup blueberries (1 carbohydrate serving)  1 ounce almonds  1 cup skim milk (1 carbohydrate serving)  1 cup coffee  Morning Snack 1 cup sugar-free nonfat yogurt (1 carbohydrate serving)  Lunch 2 slices whole-wheat bread (2 carbohydrate servings)  2 ounces lean [place] breast  1 ounce low-fat Swiss cheese  1 teaspoon mustard  1 slice tomato  1 lettuce leaf  1 small pear (1 carbohydrate serving)  1 cup skim milk (1 carbohydrate serving)  Afternoon Snack 1 ounce trail mix with unsalted nuts, seeds, and raisins (1 carbohydrate serving)  Evening Meal 3 ounces salmon  2/3 cup cooked brown rice (2 carbohydrate servings)  1 teaspoon soft margarine  1 cup cooked broccoli with 1/2 cup cooked carrots (1 carbohydrate serving  Carrots,  cooked, boiled, drained, without salt  1 cup lettuce  1 teaspoon olive oil with vinegar for dressing  1 small whole grain roll (1 carbohydrate serving)  1 teaspoon soft margarine  1 cup unsweetened tea  Evening Snack 1 extra-small banana (1 carbohydrate serving)  Copyright 2020  [employer]. All rights reserved.

## 2024-01-22 NOTE — Progress Notes (Signed)
 Heart Failure Stewardship Pharmacy Note  PCP: Danelle Berry, PA-C PCP-Cardiologist: Debbe Odea, MD  HPI: Cory Rivas is a 58 y.o. male with CAD, chronic HFpEF, COPD Gold stage II, HTN, HLD, gout, CKD stage II who presented with worsening of cough, wheezing, and shortness of breath over the past week and a 10 lb weight gain in the last month. Reports his diuretic was less effective after his most recent hospitalization. On admission, BNP was 152.3, HS-troponin was 21, and uric acid was 9.3. Chest x-ray noted pulmonary edema and COPD.    Pertinent cardiac history: Stress test in 09/2020 was low risk, LVEF was 59%. Echo 10/2020 with LVEF 60-65% and grade I diastolic dysfunction. Echo 08/2022 with LVEF 60-65%, mild LVH, grade II diastolic dysfunction. Echo 05/2023 with LVEF 50-55%, moderate LVH, grade III diastolic fysfunction. LHC 05/2023 noted moderate nonobstructive CAD. RHC 05/2023 showed RA 7, PA 45/16 (26), PCWP 18, CI 2.4, PVR 1.6 WU. LHC 10/2023 with similar nonobstructive CAD. RHC 10/2023 with PA 39/14 (22), PCWP 11, CI 2.5, PVR 2.1 WU.  Pertinent Lab Values: Creat  Date Value Ref Range Status  11/01/2023 1.35 (H) 0.70 - 1.30 mg/dL Final   Creatinine, Ser  Date Value Ref Range Status  01/22/2024 1.71 (H) 0.61 - 1.24 mg/dL Final   BUN  Date Value Ref Range Status  01/22/2024 45 (H) 6 - 20 mg/dL Final  81/19/1478 17 6 - 24 mg/dL Final   Potassium  Date Value Ref Range Status  01/22/2024 4.3 3.5 - 5.1 mmol/L Final   Sodium  Date Value Ref Range Status  01/22/2024 135 135 - 145 mmol/L Final  11/15/2023 142 134 - 144 mmol/L Final   Brain Natriuretic Peptide  Date Value Ref Range Status  07/01/2020 66 <100 pg/mL Final    Comment:    . BNP levels increase with age in the general population with the highest values seen in individuals greater than 52 years of age. Reference: J. Am. Ladon Applebaum. Cardiol. 2002; 29:562-130. .    B Natriuretic Peptide  Date Value Ref Range  Status  01/20/2024 152.3 (H) 0.0 - 100.0 pg/mL Final    Comment:    Performed at [facility], [address] [place], [place] [address]   Magnesium  Date Value Ref Range Status  10/24/2023 2.2 1.7 - 2.4 mg/dL Final    Comment:    Performed at [facility], [address] [place], [place] [address]   Hemoglobin A1C  Date Value Ref Range Status  11/28/2023 7.9 (A) 4.0 - 5.6 % Final  01/02/2022 6.5  Final   Hgb A1c MFr Bld  Date Value Ref Range Status  08/09/2023 9.8 (H) <5.7 % of total Hgb Final    Comment:    For someone without known diabetes, a hemoglobin A1c value of 6.5% or greater indicates that they may have  diabetes and this should be confirmed with a follow-up  test. . For someone with known diabetes, a value <7% indicates  that their diabetes is well controlled and a value  greater than or equal to 7% indicates suboptimal  control. A1c targets should be individualized based on  duration of diabetes, age, comorbid conditions, and  other considerations. . Currently, no consensus exists regarding use of hemoglobin A1c for diagnosis of diabetes for children. .    TSH  Date Value Ref Range Status  12/18/2017 1.86 0.41 - 5.90 Final    Vital Signs: Admission weight: 231 lbs Temp:  [97.8 F (  36.6 C)-98.6 F (37 C)] 97.9 F (36.6 C) (04/09 1141) Pulse Rate:  [55-72] 56 (04/09 1141) Cardiac Rhythm: Sinus bradycardia (04/09 0700) Resp:  [15-23] 16 (04/09 1141) BP: (102-130)/(65-81) 102/66 (04/09 1141) SpO2:  [93 %-97 %] 94 % (04/09 1141) Weight:  [98.2 kg (216 lb 7.9 oz)-98.2 kg (216 lb 8 oz)] 98.2 kg (216 lb 7.9 oz) (04/09 0254)  Intake/Output Summary (Last 24 hours) at 01/22/2024 1552 Last data filed at 01/22/2024 0900 Gross per 24 hour  Intake 250 ml  Output 650 ml  Net -400 ml    Current Heart Failure Medications:  Loop diuretic: furosemide 40 mg IV BID Beta-Blocker: carvedilol 25 mg BID ACEI/ARB/ARNI: none MRA: spironolactone 25  mg daily SGLT2i: none Other: amlodipine 10 mg daily  Prior to admission Heart Failure Medications:  Loop diuretic: torsemide 20 mg BID Beta-Blocker: carvedilol 25 mg BID ACEI/ARB/ARNI: lisinopril 40 mg daily MRA: spironolactone 25 mg daily SGLT2i: Jardiance 10 mg daily Other: amlodipine 10 mg daily (Antianginal)  Assessment: 1. Acute on chronic diastolic heart failure (LVEF 50-55%) with grade III diastolic dysfunction, due to NICM. NYHA class III-IV symptoms.  -Symptoms: Reports shortness of breath is much improved today. Reports appetite is fair. Denies LEE. Reports he was very sleepy yesterday during out discussion and did not remember much. -Volume: Volume difficult to assess due to body habitus. Creatinine is up from baseline, though stable from yesterday. BUN is trending up. No weight charted today. Reports dry weight was ~221 lbs a month prior. Furosemide 40 mg IV BID transitioned to torsemide today. -Hemodynamics: BP remains elevated. HR 60-70s. -SGLT2i: Consider resuming Jardiance 10 mg daily -MRA: Spironolactone held 4/8 due to K of 5.1. Would consider resuming tomorrow if potassium is stable given he appears to tolerate it PTA. If potassium is hypersensitive to spironolactone, could try finerenone outpatient.  -ARNI: Previously did not tolerate Entresto. Lisinopril is currently being held. Can consider resuming when creatinine is trending down. -Carvedilol transitioned to bisoprolol 5 mg BID given COPD. HR lower today, prior to first dose of bisoprolol. May require reducing to 5 mg daily rather than BID tomorrow AM if HR remains low. -Consider GLP-1RA outpatient  Plan: 1) Medication changes recommended at this time: -Consider resuming Jardiance 10 mg daily  2) Patient assistance: -Copays are $4  3) Education: - Patient has been educated on current HF medications and potential additions to HF medication regimen - Patient verbalizes understanding that over the next few months,  these medication doses may change and more medications may be added to optimize HF regimen - Patient has been educated on basic disease state pathophysiology and goals of therapy  Medication Assistance / Insurance Benefits Check: Does the patient have prescription insurance?    Type of insurance plan:  Does the patient qualify for medication assistance through manufacturers or grants? Pending   Outpatient Pharmacy: Prior to admission outpatient pharmacy: [facility]     Patient would like to see the cost of medications through [facility] pill packing, as he reports medications at [facility] are too expensive.  Please do not hesitate to reach out with questions or concerns,  Enos Fling, PharmD, CPP, BCPS Heart Failure Pharmacist  Phone - [phone] 01/22/2024 3:52 PM

## 2024-01-22 NOTE — Plan of Care (Signed)

## 2024-01-22 NOTE — Progress Notes (Signed)
 Progress Note   Patient: Cory Rivas WGN:[id number] DOB: [birth year] DOA: 01/20/2024     2 DOS: the patient was seen and examined on 01/22/2024   Brief hospital course: CHARISTOPHER RUMBLE is a 58 y.o. male with medical history significant of CAD, chronic HFpEF, COPD Gold stage II, HTN, HLD, gout, CKD stage II, presented with worsening of cough wheezing shortness of breath.   Symptoms started 2 weeks ago, patient started to have increasing exertional dyspnea and ported a cough with clear phlegm.  He also noticed about 10 pounds of weight gaining for about 1 month.  He also said that the torsemide has been taking " no longer works" as his urine output has not been as before after taking torsemide in the morning.  But he has not tried a double dose of his diuresis.  Last night, patient also started to have wheezing and cannot sleep and decided come to ED.  Denies any fever chills no peripheral edema.  No chest pains.  Over the weekend patient also had a flareup of his gout on the right toes, for which he took 1 dose of colchicine and get some relief, he continues to take allopurinol every day.   ED Course: Afebrile, blood pressure 170/90 O2 saturation 87% on room air and stabilized on 2 L.  Chest x-ray showed pulmonary congestion as well as chronic interstitial changes compatible with COPD.  Blood work showed WBC 9.7 hemoglobin 13.9, creatinine 1.4 compared to baseline 1.1-1.3.  K3.7    Assessment and Plan:  Acute hypoxic respiratory failure Likely multifactorial from acute on chronic HFpEF as well as acute COPD exacerbation Patient had room air pulse oximetry of 87% and is down to 1 L . Was assessed for home oxygen need Patient Saturations on Room Air at Rest = 93% Patient Saturations on Room Air while Ambulating = 88% Patient Saturations on 1 Liters of oxygen while Ambulating = 95% He will likely be discharged home on 1 L of oxygen with activity.      Acute COPD exacerbation Improved Continue  systemic and inhaled steroids Continue scheduled and as needed bronchodilator therapy Continue antitussives     Acute on chronic HFpEF decompensation Patient had a 2D echocardiogram which was done 01/25 and showed a normal LVEF of 55 to 60% with no regional wall motion abnormalities. IV Lasix has been switched to torsemide Carvedilol has been switched to bisoprolol Check daily weights      History of CAD Status post recent LHC which showed a stable LAD and OMI stenosis.   Cardiology recommended to continue medical management Continue aspirin, Repatha bisoprolol and Crestor       History of mild pulmonary hypertension History of OSA Not on CPAP due to insurance issue.     Insulin-dependent diabetes mellitus Continue Lantus 15 units daily as well as sliding scale insulin Maintain consistent carbohydrate diet     Morbid obesity BMI 45.1 Complicates overall prognosis and care Lifestyle modification exercises been discussed with patient      Nicotine dependence Smoking cessation was discussed with patient again and he is interested in quitting smoking Will start him on a nicotine transdermal patch         Subjective: Feels better.  Less short of breath.  Physical Exam: Vitals:   01/22/24 0254 01/22/24 0403 01/22/24 0753 01/22/24 1141  BP:  111/79 128/81 102/66  Pulse:  (!) 55 (!) 58 (!) 56  Resp:  18 18 16   Temp:  98.2 F (36.8  C) 97.8 F (36.6 C) 97.9 F (36.6 C)  TempSrc:  Oral Oral   SpO2:  97% 94% 94%  Weight: 98.2 kg     Height:       Eyes: PERRL, lids and conjunctivae normal ENMT: Mucous membranes are moist. Posterior pharynx clear of any exudate or lesions.Normal dentition.  Neck: normal, supple, no masses, no thyromegaly Respiratory: clear to auscultation bilaterally, Cardiovascular: Regular rate and rhythm, no murmurs / rubs / gallops.  Trace extremity edema. 2+ pedal pulses. No carotid bruits.  Abdomen: no tenderness, no masses palpated. No  hepatosplenomegaly. Bowel sounds positive.  Musculoskeletal: no clubbing / cyanosis. No joint deformity upper and lower extremities. Good ROM, no contractures. Normal muscle tone.  Skin: no rashes, lesions, ulcers. No induration Neurologic: CN 2-12 grossly intact. Sensation intact, DTR normal. Strength 5/5 in all 4.  Psychiatric: Normal judgment and insight. Alert and oriented x 3. Normal mood.    Data Reviewed: Creatinine 1.6, BUN 41 Labs reviewed  Family Communication: Plan of care was discussed with patient in detail.  He verbalizes understanding and agrees with the plan.  Disposition: Status is: Inpatient Remains inpatient appropriate because: Possible discharge in a.m.  Planned Discharge Destination: Home    Time spent: 33 minutes  Author: Lucile Shutters, MD 01/22/2024 12:52 PM  For on call review [email or web address].

## 2024-01-23 ENCOUNTER — Other Ambulatory Visit: Payer: Self-pay

## 2024-01-23 DIAGNOSIS — F17200 Nicotine dependence, unspecified, uncomplicated: Secondary | ICD-10-CM | POA: Diagnosis present

## 2024-01-23 DIAGNOSIS — I10 Essential (primary) hypertension: Secondary | ICD-10-CM | POA: Diagnosis not present

## 2024-01-23 DIAGNOSIS — J441 Chronic obstructive pulmonary disease with (acute) exacerbation: Secondary | ICD-10-CM | POA: Diagnosis not present

## 2024-01-23 DIAGNOSIS — J9601 Acute respiratory failure with hypoxia: Secondary | ICD-10-CM | POA: Diagnosis not present

## 2024-01-23 DIAGNOSIS — I5033 Acute on chronic diastolic (congestive) heart failure: Secondary | ICD-10-CM | POA: Diagnosis not present

## 2024-01-23 LAB — BASIC METABOLIC PANEL WITH GFR
Anion gap: 11 (ref 5–15)
BUN: 46 mg/dL — ABNORMAL HIGH (ref 6–20)
CO2: 25 mmol/L (ref 22–32)
Calcium: 8.4 mg/dL — ABNORMAL LOW (ref 8.9–10.3)
Chloride: 99 mmol/L (ref 98–111)
Creatinine, Ser: 1.8 mg/dL — ABNORMAL HIGH (ref 0.61–1.24)
GFR, Estimated: 44 mL/min — ABNORMAL LOW (ref >60)
Glucose, Bld: 153 mg/dL — ABNORMAL HIGH (ref 70–99)
Potassium: 3.6 mmol/L (ref 3.5–5.1)
Sodium: 135 mmol/L (ref 135–145)

## 2024-01-23 LAB — GLUCOSE, CAPILLARY
Glucose-Capillary: 191 mg/dL — ABNORMAL HIGH (ref 70–99)
Glucose-Capillary: 87 mg/dL (ref 70–99)

## 2024-01-23 MED ORDER — BISOPROLOL FUMARATE 5 MG PO TABS
5.0000 mg | ORAL_TABLET | Freq: Every day | ORAL | 0 refills | Status: DC
  Filled 2024-01-23: qty 30, 30d supply, fill #0

## 2024-01-23 MED ORDER — BISOPROLOL FUMARATE 5 MG PO TABS: 5.0000 mg | ORAL_TABLET | Freq: Every day | ORAL | Status: DC

## 2024-01-23 MED ORDER — INSULIN GLARGINE-YFGN 100 UNIT/ML ~~LOC~~ SOLN
10.0000 [IU] | SUBCUTANEOUS | Status: DC
Start: 2024-01-23 — End: 2024-01-23

## 2024-01-23 MED ORDER — PREDNISONE 20 MG PO TABS
40.0000 mg | ORAL_TABLET | Freq: Every day | ORAL | 0 refills | Status: AC
  Filled 2024-01-23: qty 4, 2d supply, fill #0

## 2024-01-23 NOTE — Inpatient Diabetes Management (Signed)
 Inpatient Diabetes Program Recommendations  AACE/ADA: New Consensus Statement on Inpatient Glycemic Control   Target Ranges:  Prepandial:   less than 140 mg/dL      Peak postprandial:   less than 180 mg/dL (1-2 hours)      Critically ill patients:  140 - 180 mg/dL    Latest Reference Range & Units 01/22/24 07:55 01/22/24 11:39 01/22/24 16:22 01/22/24 21:04 01/22/24 21:48 01/23/24 00:12  Glucose-Capillary 70 - 99 mg/dL 098 (H) 119 (H) 147 (H) 69 (L) 162 (H) 87    Review of Glycemic Control  Diabetes history: DM2 Outpatient Diabetes medications: Jardiance 10 mg daily, Metformin XR 1000 mg BID, Trulicity 3 mg Qweek Current orders for Inpatient glycemic control: Semglee 15 units Q24H, Novolog 0-20 units TID with meals, Novolog 0-5 units at bedtime; Prednisone 40 mg daily  Inpatient Diabetes Program Recommendations:    Insulin: Please consider decreasing Semglee 12 units Q24H and decrease Novolog correction to 0-15 units TID with meals.   Thanks, Orlando Penner, RN, MSN, CDCES Diabetes Coordinator Inpatient Diabetes Program [phone] (Team Pager from 8am to 5pm)

## 2024-01-23 NOTE — Progress Notes (Signed)
 Heart Failure Stewardship Pharmacy Note  PCP: Danelle Berry, PA-C PCP-Cardiologist: Debbe Odea, MD  HPI: Cory Rivas is a 58 y.o. male with CAD, chronic HFpEF, COPD Gold stage II, HTN, HLD, gout, CKD stage II who presented with worsening of cough, wheezing, and shortness of breath over the past week and a 10 lb weight gain in the last month. Reports his diuretic was less effective after his most recent hospitalization. On admission, BNP was 152.3, HS-troponin was 21, and uric acid was 9.3. Chest x-ray noted pulmonary edema and COPD.    Pertinent cardiac history: Stress test in 09/2020 was low risk, LVEF was 59%. Echo 10/2020 with LVEF 60-65% and grade I diastolic dysfunction. Echo 08/2022 with LVEF 60-65%, mild LVH, grade II diastolic dysfunction. Echo 05/2023 with LVEF 50-55%, moderate LVH, grade III diastolic fysfunction. LHC 05/2023 noted moderate nonobstructive CAD. RHC 05/2023 showed RA 7, PA 45/16 (26), PCWP 18, CI 2.4, PVR 1.6 WU. LHC 10/2023 with similar nonobstructive CAD. RHC 10/2023 with PA 39/14 (22), PCWP 11, CI 2.5, PVR 2.1 WU.  Pertinent Lab Values: Creat  Date Value Ref Range Status  11/01/2023 1.35 (H) 0.70 - 1.30 mg/dL Final   Creatinine, Ser  Date Value Ref Range Status  01/22/2024 1.71 (H) 0.61 - 1.24 mg/dL Final   BUN  Date Value Ref Range Status  01/22/2024 45 (H) 6 - 20 mg/dL Final  01/65/5374 17 6 - 24 mg/dL Final   Potassium  Date Value Ref Range Status  01/22/2024 4.3 3.5 - 5.1 mmol/L Final   Sodium  Date Value Ref Range Status  01/22/2024 135 135 - 145 mmol/L Final  11/15/2023 142 134 - 144 mmol/L Final   Brain Natriuretic Peptide  Date Value Ref Range Status  07/01/2020 66 <100 pg/mL Final    Comment:    . BNP levels increase with age in the general population with the highest values seen in individuals greater than 20 years of age. Reference: J. Am. Ladon Applebaum. Cardiol. 2002; 82:707-867. .    B Natriuretic Peptide  Date Value Ref Range  Status  01/20/2024 152.3 (H) 0.0 - 100.0 pg/mL Final    Comment:    Performed at [facility], [address] [place], [place] [address]   Magnesium  Date Value Ref Range Status  10/24/2023 2.2 1.7 - 2.4 mg/dL Final    Comment:    Performed at [facility], [address] [place], [place] [address]   Hemoglobin A1C  Date Value Ref Range Status  11/28/2023 7.9 (A) 4.0 - 5.6 % Final  01/02/2022 6.5  Final   Hgb A1c MFr Bld  Date Value Ref Range Status  08/09/2023 9.8 (H) <5.7 % of total Hgb Final    Comment:    For someone without known diabetes, a hemoglobin A1c value of 6.5% or greater indicates that they may have  diabetes and this should be confirmed with a follow-up  test. . For someone with known diabetes, a value <7% indicates  that their diabetes is well controlled and a value  greater than or equal to 7% indicates suboptimal  control. A1c targets should be individualized based on  duration of diabetes, age, comorbid conditions, and  other considerations. . Currently, no consensus exists regarding use of hemoglobin A1c for diagnosis of diabetes for children. .    TSH  Date Value Ref Range Status  12/18/2017 1.86 0.41 - 5.90 Final    Vital Signs: Admission weight: 231 lbs Temp:  [97.5 F (  36.4 C)-98.4 F (36.9 C)] 97.6 F (36.4 C) (04/10 0359) Pulse Rate:  [46-61] 50 (04/10 0359) Cardiac Rhythm: Sinus bradycardia (04/09 2106) Resp:  [15-19] 19 (04/10 0013) BP: (102-128)/(66-80) 128/79 (04/10 0359) SpO2:  [92 %-100 %] 98 % (04/10 0359) Weight:  [98 kg (216 lb 0.8 oz)] 98 kg (216 lb 0.8 oz) (04/10 0500)  Intake/Output Summary (Last 24 hours) at 01/23/2024 0815 Last data filed at 01/22/2024 2054 Gross per 24 hour  Intake 240 ml  Output 1050 ml  Net -810 ml    Current Heart Failure Medications:  Loop diuretic: furosemide 40 mg IV BID Beta-Blocker: carvedilol 25 mg BID ACEI/ARB/ARNI: none MRA: spironolactone 25 mg daily SGLT2i:  none Other: amlodipine 10 mg daily  Prior to admission Heart Failure Medications:  Loop diuretic: torsemide 20 mg BID Beta-Blocker: carvedilol 25 mg BID ACEI/ARB/ARNI: lisinopril 40 mg daily MRA: spironolactone 25 mg daily SGLT2i: Jardiance 10 mg daily Other: amlodipine 10 mg daily (Antianginal)  Assessment: 1. Acute on chronic diastolic heart failure (LVEF 50-55%) with grade III diastolic dysfunction, due to NICM. NYHA class III-IV symptoms.  -Symptoms: Reports feeling unwell overnight due to low blood glucose. Also has some shortness of breath. Breathing is now much improved. Appetite is good. -Volume: Volume difficult to assess due to body habitus. Creatinine was up from baseline yesterday with none today. Weight stable ~216. Furosemide 40 mg IV BID transitioned to torsemide 20 mg BID 4/9.. -Hemodynamics: BP remains elevated. HR 60-70s. -SGLT2i: Consider resuming Jardiance 10 mg daily -MRA: Spironolactone held 4/8 due to K of 5.1. Would consider resuming tomorrow if potassium is stable given he appears to tolerate it PTA. No labs drawn today. If potassium is hypersensitive to spironolactone, could try finerenone outpatient.  -ARNI: Previously did not tolerate Entresto. Lisinopril is currently being held. Can consider resuming when creatinine is trending down. -Carvedilol transitioned to bisoprolol 5 mg BID given COPD. HR remains low. Recommend seeing how patient tolerates 5 mg once rather than BID today given bradycardia. Can adjust tomorrow AM pending HR. -Consider GLP-1RA outpatient  Plan: 1) Medication changes recommended at this time: -Consider reducing insulin dose given hypoglycemia and resuming Jardiance 10 mg daily tomorrow if glucose stable. -Consider decreasing bisoprolol to 2.5 mg daily -Patient would like to transition pill packing to [facility] due to cost of [place] packing. Will help to arrange this at discharge.  2) Patient assistance: -Copays are $4  3)  Education: - Patient has been educated on current HF medications and potential additions to HF medication regimen - Patient verbalizes understanding that over the next few months, these medication doses may change and more medications may be added to optimize HF regimen - Patient has been educated on basic disease state pathophysiology and goals of therapy  Medication Assistance / Insurance Benefits Check: Does the patient have prescription insurance?    Type of insurance plan:  Does the patient qualify for medication assistance through manufacturers or grants? Pending   Outpatient Pharmacy: Prior to admission outpatient pharmacy: [facility]     Patient would like to see the cost of medications through [facility] pill packing, as he reports medications at [facility] are too expensive.  Please do not hesitate to reach out with questions or concerns,  Enos Fling, PharmD, CPP, BCPS Heart Failure Pharmacist  Phone - [phone] 01/23/2024 8:15 AM

## 2024-01-23 NOTE — Progress Notes (Signed)
 Progress Note  Patient Name: Cory Rivas Date of Encounter: 01/23/2024  Primary Cardiologist: Agbor-Etang  Subjective   No chest pain or dyspnea.  Feels back to baseline.  Renal function trending up from 1.71-1.8 this morning.  Inpatient Medications    Scheduled Meds:  allopurinol  400 mg Oral Daily   amLODipine  10 mg Oral Daily   aspirin EC  81 mg Oral Daily   baclofen  10 mg Oral TID   [START ON 01/24/2024] bisoprolol  5 mg Oral Daily   budesonide (PULMICORT) nebulizer solution  0.5 mg Nebulization Q12H   DULoxetine  60 mg Oral Daily   enoxaparin (LOVENOX) injection  50 mg Subcutaneous Q24H   ezetimibe  10 mg Oral Daily   insulin aspart  0-20 Units Subcutaneous TID WC   ipratropium-albuterol  3 mL Nebulization TID   mometasone-formoterol  2 puff Inhalation BID   nicotine  21 mg Transdermal Daily   pantoprazole  40 mg Oral Daily   predniSONE  40 mg Oral Q breakfast   pregabalin  50 mg Oral BID   rosuvastatin  40 mg Oral Daily   sodium chloride flush  3 mL Intravenous Q12H   torsemide  20 mg Oral BID   umeclidinium bromide  1 puff Inhalation Daily   Continuous Infusions:  PRN Meds: acetaminophen, ondansetron (ZOFRAN) IV, sodium chloride flush   Vital Signs    Vitals:   01/23/24 0013 01/23/24 0359 01/23/24 0500 01/23/24 0840  BP: 124/80 128/79  (!) 140/75  Pulse: (!) 46 (!) 50  (!) 56  Resp: 19     Temp:  97.6 F (36.4 C)  98.1 F (36.7 C)  TempSrc:  Oral    SpO2: 100% 98%  98%  Weight:   98 kg   Height:        Intake/Output Summary (Last 24 hours) at 01/23/2024 1112 Last data filed at 01/22/2024 2054 Gross per 24 hour  Intake 240 ml  Output 400 ml  Net -160 ml   Filed Weights   01/21/24 2001 01/22/24 0254 01/23/24 0500  Weight: 98.2 kg 98.2 kg 98 kg    Telemetry    SR - Personally Reviewed  ECG    No new tracings - Personally Reviewed  Physical Exam   GEN: No acute distress.   Neck: JVD difficult to visualize secondary to body  habitus. Cardiac: RRR, I/VI systolic murmur URSB, no rubs, or gallops.  Respiratory: Clear to auscultation bilaterally.  GI: Soft, nontender, non-distended.   MS: No edema; No deformity. Neuro:  Alert and oriented x 3; Nonfocal.  Psych: Normal affect.  Labs    Chemistry Recent Labs  Lab 01/22/24 0510 01/22/24 1309 01/23/24 1040  NA 136 135 135  K 4.0 4.3 3.6  CL 101 99 99  CO2 26 29 25   GLUCOSE 120* 136* 153*  BUN 41* 45* 46*  CREATININE 1.61* 1.71* 1.80*  CALCIUM 8.6* 8.5* 8.4*  GFRNONAA 50* 46* 44*  ANIONGAP 9 7 11      Hematology Recent Labs  Lab 01/20/24 0600  WBC 9.7  RBC 4.82  HGB 13.9  HCT 42.5  MCV 88.2  MCH 28.8  MCHC 32.7  RDW 13.1  PLT 195    Cardiac EnzymesNo results for input(s): "TROPONINI" in the last 168 hours. No results for input(s): "TROPIPOC" in the last 168 hours.   BNP Recent Labs  Lab 01/20/24 0600  BNP 152.3*     DDimer No results for input(s): "DDIMER" in  the last 168 hours.   Radiology    DG Chest Portable 2 Views Result Date: 01/21/2024 IMPRESSION: 1. Lower lung volumes from earlier today. Increase in peribronchial thickening, may be pulmonary edema or bronchitis. 2. Ill-defined opacity at the lung bases, favor atelectasis. Electronically Signed   By: Narda Rutherford M.D.   On: 01/21/2024 17:24    Cardiac Studies   2D echo 11/12/2023: 1. Left ventricular ejection fraction, by estimation, is 55 to 60%. The  left ventricle has normal function. The left ventricle has no regional  wall motion abnormalities. Left ventricular diastolic parameters were  normal. The average left ventricular  global longitudinal strain is -13.3 %.   2. Right ventricular systolic function is normal. The right ventricular  size is normal. Tricuspid regurgitation signal is inadequate for assessing  PA pressure.   3. The mitral valve is normal in structure. Mild mitral valve  regurgitation. No evidence of mitral stenosis.   4. The aortic valve is  normal in structure. There is mild calcification  of the aortic valve. Aortic valve regurgitation is not visualized. Aortic  valve sclerosis is present, with no evidence of aortic valve stenosis.   5. The inferior vena cava is normal in size with greater than 50%  respiratory variability, suggesting right atrial pressure of 3 mmHg.  __________   [facility] 10/30/2023: Conclusions: Moderate LAD and OM1 disease, not significantly different compared to prior catheterization in 05/2023.  RFR of sequential ostial and proximal LAD stenosis at that time was not hemodynamically significant. Normal left and right heart filling pressures. Mild pulmonary hypertension. Normal Fick cardiac output/index.   Recommendations: Increase amlodipine to 10 mg daily for BP control and antianginal therapy. Consider resumption of lisinopril +/- spironolactone at follow-up based on blood pressure and renal function. Continue aggressive secondary prevention of coronary artery disease. __________   [facility] 05/29/2023: Conclusions: Moderate, nonobstructive coronary artery disease with sequential 50-60% ostial and proximal LAD stenoses that are not hemodynamically significant (RFR = 0.93).  There is also a 40% ostial stenosis of a large first OM branch.  Nondominant RCA is without significant disease. Upper normal to mildly elevated left and right heart filling pressures. Mild pulmonary hypertension. Borderline low Fick cardiac output/index. Mild aortic stenosis.   Recommendations: Resume gentle diuresis tomorrow if renal function is stable. Medical therapy and risk factor modification to prevent progression of coronary artery disease. __________   2D echo 05/28/2023: 1. Left ventricular ejection fraction, by estimation, is 50 to 55%. The  left ventricle has low normal function. The left ventricle has no regional  wall motion abnormalities. There is moderate left ventricular hypertrophy.  Left ventricular diastolic   parameters are consistent with Grade III diastolic dysfunction  (restrictive). Elevated left atrial pressure. The average left ventricular  global longitudinal strain is -10.0 %. The global longitudinal strain is  abnormal.   2. Right ventricular systolic function is normal. The right ventricular  size is normal. Tricuspid regurgitation signal is inadequate for assessing  PA pressure.   3. Left atrial size was mildly dilated.   4. The mitral valve was not well visualized. Mild mitral valve  regurgitation.   5. The aortic valve has an indeterminant number of cusps. There is  moderate calcification of the aortic valve. There is moderate thickening  of the aortic valve. Aortic valve regurgitation is not visualized. Aortic  valve sclerosis/calcification is  present, without any evidence of aortic stenosis.   6. The inferior vena cava is normal in size with greater  than 50%  respiratory variability, suggesting right atrial pressure of 3 mmHg.  __________   2D echo 08/23/2022: 1. Left ventricular ejection fraction, by estimation, is 60 to 65%. The  left ventricle has normal function. The left ventricle has no regional  wall motion abnormalities. There is mild left ventricular hypertrophy.  Left ventricular diastolic parameters  are consistent with Grade II diastolic dysfunction (pseudonormalization).   2. Right ventricular systolic function is normal. The right ventricular  size is normal.   3. The mitral valve is normal in structure. Mild mitral valve  regurgitation. No evidence of mitral stenosis.   4. The aortic valve is calcified. Aortic valve regurgitation is not  visualized. Aortic valve sclerosis/calcification is present, without any  evidence of aortic stenosis.   5. The inferior vena cava is normal in size with greater than 50%  respiratory variability, suggesting right atrial pressure of 3 mmHg.  __________   2D echo 11/04/2020: 1. Left ventricular ejection fraction, by  estimation, is 60 to 65%. The  left ventricle has normal function. The left ventricle has no regional  wall motion abnormalities. Left ventricular diastolic parameters are  consistent with Grade I diastolic  dysfunction (impaired relaxation).There is mild left ventricular  hypertrophy of the basal segment. LVOT gradient of 10 mm Hg at rest, 22 mm  Hg gradient with valsalva   2. Right ventricular systolic function is normal. The right ventricular  size is normal. There is normal pulmonary artery systolic pressure. The  estimated right ventricular systolic pressure is 31.6 mmHg.   3. Left atrial size was mildly dilated.  ____________   [facility] 10/10/2020: Low risk, probably normal pharmacologic myocardial perfusion stress tst. There is a small in size, moderate in severity, fixed defect at the apex most likely representing artifact (apical thinning and attenuation) and less likely scar. There is no evidence of significant ischemia. The left ventricular ejection fraction is normal (59%). There is no significant coronary artery calcification on the attenuation correction CT.  Patient Profile     58 y.o. male with history of nonobstructive CAD, HFpEF, CKD stage IIIa, IDDM, HTN, HLD, cervical myelopathy, idiopathic chronic gout, tobacco use for 35+years, asthma, obesity with OSA and tracheobronchomalacia who was admitted with acute hypoxic respiratory failure secondary to COPD exacerbation and HFpEF and we are seeing for HFpEF.  Assessment & Plan    1.  Acute hypoxic respiratory failure: - Multifactorial including COPD exacerbation and HFpEF with obesity, OSA, OHS, and tracheobronchomalacia - Volume status improved - Management of HFpEF as outlined below - COPD exacerbation per IM  2.  Acute on chronic HFpEF/pulmonary hypertension: - Volume status improved - Torsemide 20 mg twice daily - In outpatient setting consider resumption of Jardiance - Defer MRA with renal dysfunction  3.  Nonobstructive CAD with elevated high-sensitivity troponin: - No symptoms suggestive of angina - Mildly elevated and flat trending high-sensitivity troponin not consistent with ACS - Recent LHC showed nonobstructive CAD -PTA aspirin, rosuvastatin, and ezetimibe - No plans for inpatient ischemic evaluation  4.  Acute on CKD stage IIIa: - Now on torsemide in place of IV Lasix - Will need follow-up in the office  5.  HTN: - Blood pressure -Reduce bisoprolol to 5 mg daily given bradycardia with continuation of amlodipine 10 mg - Pharmacotherapy as above  6.  Obesity with asthma, OSA, and tracheobronchomalacia: - Contributing to overall presentation  7.  Tobacco use: - Cessation recommended       For questions or updates, please  contact [facility] Please consult [email or web address] for contact info under Cardiology/STEMI.    Signed, Eula Listen, PA-C [facility] Pager: [phone] 01/23/2024, 11:12 AM

## 2024-01-23 NOTE — Progress Notes (Signed)
 Patient's oxygen saturation at rest on Room Air: 100%  Patient's oxygen saturation with exertion, ambulating, on Room Air: 98%

## 2024-01-23 NOTE — Discharge Summary (Signed)
 Physician Discharge Summary   Patient: Cory Rivas MRN: [id number] DOB: Mar 05, 1966  Admit date:     01/20/2024  Discharge date: 01/23/24  Discharge Physician: Brynnlie Unterreiner   PCP: Danelle Berry, PA-C   Recommendations at discharge:   Check daily weights Check blood sugars daily Keep scheduled follow-up appointment with cardiology Discharge Diagnoses: Principal Problem:   Acute on chronic heart failure with preserved ejection fraction (HFpEF) (HCC) Active Problems:   Acute respiratory failure with hypoxia (HCC)   COPD exacerbation (HCC)   Pleuritic chest pain   Type II diabetes mellitus with renal manifestations (HCC)   Class 2 severe obesity with serious comorbidity and body mass index (BMI) of 39.0 to 39.9 in adult [facility])   Acute pulmonary edema (HCC)   Nicotine dependence  Resolved Problems:   * No resolved hospital problems. *  Hospital Course:  Cory Rivas is a 58 y.o. male with medical history significant of CAD, chronic HFpEF, COPD Gold stage II, HTN, HLD, gout, CKD stage II, presented with worsening of cough wheezing shortness of breath.   Symptoms started 2 weeks ago, patient started to have increasing exertional dyspnea and ported a cough with clear phlegm.  He also noticed about 10 pounds of weight gaining for about 1 month.  He also said that the torsemide has been taking " no longer works" as his urine output has not been as before after taking torsemide in the morning.  But he has not tried a double dose of his diuresis.  Last night, patient also started to have wheezing and cannot sleep and decided come to ED.  Denies any fever chills no peripheral edema.  No chest pains.  Over the weekend patient also had a flareup of his gout on the right toes, for which he took 1 dose of colchicine and get some relief, he continues to take allopurinol every day.   ED Course: Afebrile, blood pressure 170/90 O2 saturation 87% on room air and stabilized on 2 L.  Chest x-ray  showed pulmonary congestion as well as chronic interstitial changes compatible with COPD.  Blood work showed WBC 9.7 hemoglobin 13.9, creatinine 1.4 compared to baseline 1.1-1.3.  K3.7   Patient was given Lasix x 1, IV Solu-Medrol x 1 and DuoNeb x 1 in the ED.     Assessment and Plan:  Acute hypoxic respiratory failure Likely multifactorial from acute on chronic HFpEF as well as acute COPD exacerbation Patient had room air pulse oximetry of 87% and is down to 1 L . Was assessed for home oxygen need Patient Saturations on Room Air at Rest = 93% Patient Saturations on Room Air while Ambulating = 88% Patient Saturations on 1 Liters of oxygen while Ambulating = 95% He will likely be discharged home on 1 L of oxygen with activity.       Acute COPD exacerbation Improved Continue systemic and inhaled steroids Continue scheduled and as needed bronchodilator therapy Continue antitussives     Acute on chronic HFpEF decompensation Patient had a 2D echocardiogram which was done 01/25 and showed a normal LVEF of 55 to 60% with no regional wall motion abnormalities. IV Lasix has been switched to torsemide Carvedilol has been switched to bisoprolol Check daily weights       History of CAD Status post recent LHC which showed a stable LAD and OMI stenosis.   Cardiology recommended to continue medical management Continue aspirin, Repatha bisoprolol and Crestor       History of mild pulmonary  hypertension History of OSA Not on CPAP due to insurance issue.     Insulin-dependent diabetes mellitus Continue Lantus 15 units daily as well as sliding scale insulin Maintain consistent carbohydrate diet     Morbid obesity BMI 45.1 Complicates overall prognosis and care Lifestyle modification exercises been discussed with patient       Nicotine dependence Smoking cessation was discussed with patient again and he is interested in quitting smoking Will start him on a nicotine  transdermal patch                   Consultants: Cardiology Procedures performed: None  Disposition: Home Diet recommendation:  Discharge Diet Orders (From admission, onward)     Start     Ordered   01/23/24 0000  Diet - low sodium heart healthy        01/23/24 1126   01/23/24 0000  Diet Carb Modified        01/23/24 1126           Cardiac and Carb modified diet DISCHARGE MEDICATION: Allergies as of 01/23/2024       Reactions   Oxycodone Nausea And Vomiting   Entresto [sacubitril-valsartan] Nausea Only, Other (See Comments)   Lightheaded and dizzy    Onion Nausea And Vomiting   Nitroglycerin Itching, Rash        Medication List     STOP taking these medications    carvedilol 25 MG tablet Commonly known as: COREG   metFORMIN 500 MG 24 hr tablet Commonly known as: GLUCOPHAGE-XR   spironolactone 25 MG tablet Commonly known as: ALDACTONE       TAKE these medications    allopurinol 300 MG tablet Commonly known as: ZYLOPRIM TAKE ONE TABLET BY MOUTH DAILY WITH 100 MG TABLET. FOR A TOTAL OF 400 MG DAILY FOR GOUT   amLODipine 10 MG tablet Commonly known as: NORVASC Take 1 tablet (10 mg total) by mouth daily.   aspirin EC 81 MG tablet Take 1 tablet (81 mg total) by mouth daily. Swallow whole.   baclofen 10 MG tablet Commonly known as: LIORESAL Take 1 tablet (10 mg total) by mouth 3 (three) times daily.   bisoprolol 5 MG tablet Commonly known as: ZEBETA Take 1 tablet (5 mg total) by mouth daily. Start taking on: January 24, 2024   budesonide-formoterol 160-4.5 MCG/ACT inhaler Commonly known as: Symbicort Inhale 2 puffs into the lungs 2 (two) times daily.   Dexcom G7 Receiver Hardie Pulley Dispense one reciever to be used with 10 d devices for IDDM   FreeStyle Libre 3 Reader [employer] with [employer] 3 device as directed   DULoxetine 30 MG capsule Commonly known as: CYMBALTA Take 60 mg (2 capsules) poqam and 30 mg (1 capsule) poqpm daily    empagliflozin 10 MG Tabs tablet Commonly known as: Jardiance Take 1 tablet (10 mg total) by mouth daily.   ezetimibe 10 MG tablet Commonly known as: ZETIA Take 1 tablet (10 mg total) by mouth daily.   fluticasone 50 MCG/ACT nasal spray Commonly known as: FLONASE Place 1 spray into both nostrils daily.   FreeStyle Libre 3 Sensor Misc 1 Device by Does not apply route every 14 (fourteen) days.   Gvoke HypoPen 1-Pack 1 MG/0.2ML Soaj Generic drug: Glucagon Inject 1 mg into the muscle once as needed (Hypoglycemia).   levocetirizine 5 MG tablet Commonly known as: XYZAL TAKE ONE TABLET BY MOUTH EVERY EVENING   lisinopril 40 MG tablet Commonly known as: ZESTRIL Take 1  tablet (40 mg total) by mouth daily.   pantoprazole 40 MG tablet Commonly known as: PROTONIX Take 1 tablet (40 mg total) by mouth daily.   predniSONE 20 MG tablet Commonly known as: DELTASONE Take 2 tablets (40 mg total) by mouth daily with breakfast for 2 days. Start taking on: January 24, 2024   pregabalin 50 MG capsule Commonly known as: LYRICA Take 1 capsule (50 mg total) by mouth 2 (two) times daily.   Repatha SureClick 140 MG/ML Soaj Generic drug: Evolocumab Inject 140 mg into the skin every 14 (fourteen) days.   rosuvastatin 40 MG tablet Commonly known as: CRESTOR Take 1 tablet (40 mg total) by mouth daily.   Spiriva Respimat 2.5 MCG/ACT Aers Generic drug: Tiotropium Bromide Monohydrate Inhale 2 puffs into the lungs daily.   torsemide 20 MG tablet Commonly known as: DEMADEX Take 20 mg by mouth 2 (two) times daily.   Trulicity 3 MG/0.5ML Soaj Generic drug: Dulaglutide Inject 3 mg into the skin once a week. SATURDAYS   Ventolin HFA 108 (90 Base) MCG/ACT inhaler Generic drug: albuterol INHALE TWO PUFFS INTO THE LUNGS EVERY SIX HOURS AS NEEDED FOR WHEEZING OR SHORTNESS OF BREATH        Follow-up Information     [facility]. Go on 01/29/2024.    Specialty: Cardiology Why: Hospital follow-up 01/29/2024 @ 9:30 AM Please bring all medications to follow-up appointment [facility], [address] Free Valet Parking at the door Contact information: [address] [place] [address] [phone]               Discharge Exam: Cory Rivas Weights   01/21/24 2001 01/22/24 0254 01/23/24 0500  Weight: 98.2 kg 98.2 kg 98 kg   Eyes: PERRL, lids and conjunctivae normal ENMT: Mucous membranes are moist. Posterior pharynx clear of any exudate or lesions.Normal dentition.  Neck: normal, supple, no masses, no thyromegaly Respiratory: clear to auscultation bilaterally, Cardiovascular: Regular rate and rhythm, no murmurs / rubs / gallops. . 2+ pedal pulses. No carotid bruits.  Abdomen: no tenderness, no masses palpated. No hepatosplenomegaly. Bowel sounds positive.  Musculoskeletal: no clubbing / cyanosis. No joint deformity upper and lower extremities. Good ROM, no contractures. Normal muscle tone.  Skin: no rashes, lesions, ulcers. No induration Neurologic: CN 2-12 grossly intact. Sensation intact, DTR normal. Strength 5/5 in all 4.  Psychiatric: Normal judgment and insight. Alert and oriented x 3. Normal mood.     Condition at discharge: stable  The results of significant diagnostics from this hospitalization (including imaging, microbiology, ancillary and laboratory) are listed below for reference.   Imaging Studies: DG Chest Portable 2 Views Result Date: 01/21/2024 CLINICAL DATA:  Shortness of breath.  CHF. EXAM: CHEST  2 VIEW PORTABLE COMPARISON:  Radiograph earlier today FINDINGS: Lower lung volumes from earlier today. Stable cardiomegaly and mediastinal contours. Increase in peribronchial thickening. Ill-defined opacity at the lung bases, favor atelectasis. Trace fluid in the fissures without significant sub pulmonic effusion. No pneumothorax. Cervicothoracic fusion hardware is partially  included. IMPRESSION: 1. Lower lung volumes from earlier today. Increase in peribronchial thickening, may be pulmonary edema or bronchitis. 2. Ill-defined opacity at the lung bases, favor atelectasis. Electronically Signed   By: Narda Rutherford M.D.   On: 01/21/2024 17:24   DG Chest 1 View Result Date: 01/21/2024 CLINICAL DATA:  CHF. EXAM: CHEST  1 VIEW COMPARISON:  05/21/2024 FINDINGS: Stable cardiomediastinal contours. Interval improvement in previous pulmonary edema pattern compared  with 05/21/2024. Chronic coarsened interstitial changes of COPD are again noted. No significant pleural effusion or airspace consolidation. IMPRESSION: 1. Interval improvement in previous pulmonary edema pattern. 2. COPD. Electronically Signed   By: Signa Kell M.D.   On: 01/21/2024 08:22   US RENAL Result Date: 01/20/2024 CLINICAL DATA:  Acute kidney injury EXAM: RENAL / URINARY TRACT ULTRASOUND COMPLETE COMPARISON:  CT 12/29/2023 FINDINGS: Right Kidney: Renal measurements: 12.2 x 4.1 x 5.4 cm = volume: 142 mL. Echogenicity within normal limits. No mass or hydronephrosis visualized. Left Kidney: Renal measurements: 11.2 x 5.1 x 4.6 cm = volume: 130 mL. Echogenicity within normal limits. No mass or hydronephrosis visualized. Bladder: Appears normal for degree of bladder distention. Other: None. IMPRESSION: Negative. Electronically Signed   By: Corlis Leak M.D.   On: 01/20/2024 09:01   DG Chest Portable 1 View Result Date: 01/20/2024 CLINICAL DATA:  Shortness of breath and cough. History of COPD and CHF. EXAM: PORTABLE CHEST 1 VIEW COMPARISON:  01/15/24 FINDINGS: Stable cardiomediastinal contours. No pleural effusion, consolidative change or pneumothorax. Chronic coarsened interstitial markings with bronchial wall thickening is again noted compatible with history of COPD. New increase interstitial markings compatible with pulmonary edema. Subsegmental atelectasis noted within both lung bases. Postoperative changes identified  within the upper thoracic spine. IMPRESSION: 1. New increase interstitial markings compatible with pulmonary edema. 2. Chronic coarsened interstitial markings with bronchial wall thickening compatible with history of COPD. Electronically Signed   By: Signa Kell M.D.   On: 01/20/2024 06:35   DG Chest 2 View Result Date: 01/15/2024 CLINICAL DATA:  Chest pain EXAM: CHEST - 2 VIEW COMPARISON:  Radiograph and CT 12/29/2023 FINDINGS: Stable cardiomediastinal silhouette. No focal consolidation, pleural effusion, or pneumothorax. No displaced rib fractures. IMPRESSION: No active cardiopulmonary disease. Electronically Signed   By: Minerva Fester M.D.   On: 01/15/2024 03:12   CT Angio Chest/Abd/Pel for Dissection W and/or Wo Contrast Result Date: 12/29/2023 CLINICAL DATA:  Acute aortic syndrome suspected, centralized chest pain radiating to the back. Chest pressure. Shortness of breath and productive cough. History of COPD and CHF. Daily smoker. EXAM: CT ANGIOGRAPHY CHEST, ABDOMEN AND PELVIS TECHNIQUE: Non-contrast CT of the chest was initially obtained. Multidetector CT imaging through the chest, abdomen and pelvis was performed using the standard protocol during bolus administration of intravenous contrast. Multiplanar reconstructed images and MIPs were obtained and reviewed to evaluate the vascular anatomy. RADIATION DOSE REDUCTION: This exam was performed according to the departmental dose-optimization program which includes automated exposure control, adjustment of the mA and/or kV according to patient size and/or use of iterative reconstruction technique. CONTRAST:  OMNIPAQUE IOHEXOL 350 MG/ML SOLN COMPARISON:  Same day chest radiograph; CTA chest 03/09/2023; CT abdomen pelvis 12/08/2018 FINDINGS: CTA CHEST FINDINGS Cardiovascular: No acute aortic syndrome. Coronary artery calcification. No pericardial effusion. Mediastinum/Nodes: Trachea and esophagus are unremarkable. No mediastinal hematoma. Enlarged  subcarinal node measuring 1.4 cm (series 5/image 68). This has increased from 03/09/2023. Additional increase in multiple subcentimeter mediastinal lymph nodes. For example 9 mm right paratracheal node (5/36). Lungs/Pleura: Mild interlobular septal thickening and patchy ground-glass opacities greatest in the lower lobes. Trace bilateral pleural effusions. No pneumothorax. Musculoskeletal: Partially visualized cervicothoracic fusion extending to T2. The irregular lucency and sclerosis about the T1 and T2 vertebral body hardware seen on I MR images is not seen on the chest without (series 4) and is likely artifactual. No acute fracture. Fusion multiple anterior osteophytes in the thoracic spine. Review of the MIP images confirms the  above findings. CTA ABDOMEN AND PELVIS FINDINGS VASCULAR Scattered calcified atherosclerotic plaque in the aorta. The mesenteric, renal, and iliac artery branches are widely patent. No aortic aneurysm or dissection. Review of the MIP images confirms the above findings. NON-VASCULAR Hepatobiliary: Cholecystectomy.  No acute abnormality. Pancreas: Unremarkable. Spleen: Unremarkable. Adrenals/Urinary Tract: Normal adrenal glands. No urinary calculi or hydronephrosis. Unremarkable bladder. Stomach/Bowel: Normal caliber large and small bowel. Colonic diverticulosis without diverticulitis. Stomach and appendix are within normal limits. Lymphatic: No lymphadenopathy. Reproductive: Unremarkable. Other: No free intraperitoneal fluid or air. Musculoskeletal: No acute fracture. Review of the MIP images confirms the above findings. IMPRESSION: 1. No acute aortic syndrome. 2. Pulmonary edema with trace bilateral pleural effusions. 3. Increased size of mediastinal lymph nodes, likely reactive. Continued attention on follow-up. 4. No acute abnormality in the abdomen or pelvis. 5. Aortic Atherosclerosis (ICD10-I70.0). Electronically Signed   By: Minerva Fester M.D.   On: 12/29/2023 02:12   DG Chest  Portable 1 View Result Date: 12/29/2023 CLINICAL DATA:  Chest pain, shortness of breath EXAM: PORTABLE CHEST 1 VIEW COMPARISON:  10/21/2023 FINDINGS: Heart and mediastinal contours are within normal limits. No focal opacities or effusions. No acute bony abnormality. IMPRESSION: No active disease. Electronically Signed   By: Charlett Nose M.D.   On: 12/29/2023 01:07    Microbiology: Results for orders placed or performed during the hospital encounter of 01/20/24  Resp panel by RT-PCR (RSV, Flu A&B, Covid) Anterior Nasal Swab     Status: None   Collection Time: 01/20/24  6:00 AM   Specimen: Anterior Nasal Swab  Result Value Ref Range Status   SARS Coronavirus 2 by RT PCR NEGATIVE NEGATIVE Final    Comment: (NOTE) SARS-CoV-2 target nucleic acids are NOT DETECTED.  The SARS-CoV-2 RNA is generally detectable in upper respiratory specimens during the acute phase of infection. The lowest concentration of SARS-CoV-2 viral copies this assay can detect is 138 copies/mL. A negative result does not preclude SARS-Cov-2 infection and should not be used as the sole basis for treatment or other patient management decisions. A negative result may occur with  improper specimen collection/handling, submission of specimen other than nasopharyngeal swab, presence of viral mutation(s) within the areas targeted by this assay, and inadequate number of viral copies(<138 copies/mL). A negative result must be combined with clinical observations, patient history, and epidemiological information. The expected result is Negative.  Fact Sheet for Patients:  BloggerCourse.com  Fact Sheet for Healthcare Providers:  SeriousBroker.it  This test is no t yet approved or cleared by the [place] FDA and  has been authorized for detection and/or diagnosis of SARS-CoV-2 by FDA under an Emergency Use Authorization (EUA). This EUA will remain  in effect (meaning this  test can be used) for the duration of the COVID-19 declaration under Section 564(b)(1) of the Act, 21 U.S.C.section 360bbb-3(b)(1), unless the authorization is terminated  or revoked sooner.       Influenza A by PCR NEGATIVE NEGATIVE Final   Influenza B by PCR NEGATIVE NEGATIVE Final    Comment: (NOTE) The Xpert Xpress SARS-CoV-2/FLU/RSV plus assay is intended as an aid in the diagnosis of influenza from Nasopharyngeal swab specimens and should not be used as a sole basis for treatment. Nasal washings and aspirates are unacceptable for Xpert Xpress SARS-CoV-2/FLU/RSV testing.  Fact Sheet for Patients: BloggerCourse.com  Fact Sheet for Healthcare Providers: SeriousBroker.it  This test is not yet approved or cleared by the [place] FDA and has been authorized for detection and/or diagnosis  of SARS-CoV-2 by FDA under an Emergency Use Authorization (EUA). This EUA will remain in effect (meaning this test can be used) for the duration of the COVID-19 declaration under Section 564(b)(1) of the Act, 21 U.S.C. section 360bbb-3(b)(1), unless the authorization is terminated or revoked.     Resp Syncytial Virus by PCR NEGATIVE NEGATIVE Final    Comment: (NOTE) Fact Sheet for Patients: BloggerCourse.com  Fact Sheet for Healthcare Providers: SeriousBroker.it  This test is not yet approved or cleared by the [place] FDA and has been authorized for detection and/or diagnosis of SARS-CoV-2 by FDA under an Emergency Use Authorization (EUA). This EUA will remain in effect (meaning this test can be used) for the duration of the COVID-19 declaration under Section 564(b)(1) of the Act, 21 U.S.C. section 360bbb-3(b)(1), unless the authorization is terminated or revoked.  Performed at [facility], [address] [place], [place] [address]     Labs: CBC: Recent Labs   Lab 01/20/24 0600  WBC 9.7  NEUTROABS 7.0  HGB 13.9  HCT 42.5  MCV 88.2  PLT 195   Basic Metabolic Panel: Recent Labs  Lab 01/20/24 0600 01/21/24 0529 01/22/24 0510 01/22/24 1309 01/23/24 1040  NA 139 137 136 135 135  K 3.7 5.1 4.0 4.3 3.6  CL 104 103 101 99 99  CO2 25 26 26 29 25   GLUCOSE 157* 167* 120* 136* 153*  BUN 12 24* 41* 45* 46*  CREATININE 1.45* 1.61* 1.61* 1.71* 1.80*  CALCIUM 8.2* 8.6* 8.6* 8.5* 8.4*   Liver Function Tests: No results for input(s): "AST", "ALT", "ALKPHOS", "BILITOT", "PROT", "ALBUMIN" in the last 168 hours. CBG: Recent Labs  Lab 01/22/24 1622 01/22/24 2104 01/22/24 2148 01/23/24 0012 01/23/24 0841  GLUCAP 240* 69* 162* 87 191*    Discharge time spent: greater than 30 minutes.  Signed: Lucile Shutters, MD [facility] 01/23/2024

## 2024-01-23 NOTE — Progress Notes (Signed)
 This RN provided discharge instructions and teaching to the patient. The patient verbalized and demonstrated understanding of the provided instructions. All outstanding questions resolved. R arm PIV removed. Cannula intact. Pt tolerated well. All belongings packed and in tow.

## 2024-01-23 NOTE — Plan of Care (Signed)

## 2024-01-24 ENCOUNTER — Telehealth: Payer: Self-pay | Admitting: Pharmacist

## 2024-01-24 ENCOUNTER — Other Ambulatory Visit: Payer: Self-pay

## 2024-01-24 DIAGNOSIS — J4530 Mild persistent asthma, uncomplicated: Secondary | ICD-10-CM

## 2024-01-24 DIAGNOSIS — R0602 Shortness of breath: Secondary | ICD-10-CM

## 2024-01-27 ENCOUNTER — Other Ambulatory Visit: Payer: Self-pay

## 2024-01-27 ENCOUNTER — Encounter: Payer: Self-pay | Admitting: Family Medicine

## 2024-01-27 ENCOUNTER — Other Ambulatory Visit

## 2024-01-27 ENCOUNTER — Ambulatory Visit: Admitting: Family Medicine

## 2024-01-27 VITALS — BP 136/88 | HR 81 | Resp 16 | Ht 60.0 in | Wt 217.0 lb

## 2024-01-27 DIAGNOSIS — M542 Cervicalgia: Secondary | ICD-10-CM

## 2024-01-27 DIAGNOSIS — Z09 Encounter for follow-up examination after completed treatment for conditions other than malignant neoplasm: Secondary | ICD-10-CM | POA: Diagnosis not present

## 2024-01-27 DIAGNOSIS — M549 Dorsalgia, unspecified: Secondary | ICD-10-CM

## 2024-01-27 DIAGNOSIS — I5032 Chronic diastolic (congestive) heart failure: Secondary | ICD-10-CM | POA: Diagnosis not present

## 2024-01-27 DIAGNOSIS — G8929 Other chronic pain: Secondary | ICD-10-CM | POA: Diagnosis not present

## 2024-01-27 DIAGNOSIS — N182 Chronic kidney disease, stage 2 (mild): Secondary | ICD-10-CM | POA: Diagnosis not present

## 2024-01-27 MED ORDER — BACLOFEN 10 MG PO TABS
10.0000 mg | ORAL_TABLET | Freq: Three times a day (TID) | ORAL | 3 refills | Status: AC | PRN
Start: 2024-01-27 — End: ?
  Filled 2024-01-27: qty 90, 30d supply, fill #0

## 2024-01-27 NOTE — Progress Notes (Unsigned)
 Patient ID: Cory Rivas, male    DOB: Apr 27, 1966, 58 y.o.   MRN: [id number]  PCP: Danelle Berry, PA-C  Chief Complaint  Patient presents with   Hospitalization Follow-up    Subjective:   Cory Rivas is a 58 y.o. male, presents to clinic with CC of the following:  HPI  Here for hospital follow up/transition of care.  Admit date: 4/7 Discharge date: 4/10   Pt was admitted for - see below New medications started per hospitalization include steroids x 2 days, several meds help - pt does have f/up with cardiology in 2 d Pt feels much better  Admit date:     01/20/2024  Discharge date: 01/23/24  Discharge Physician: Tochukwu Agbata    PCP: Danelle Berry, PA-C    Recommendations at discharge:    Check daily weights Check blood sugars daily Keep scheduled follow-up appointment with cardiology Discharge Diagnoses: Principal Problem:   Acute on chronic heart failure with preserved ejection fraction (HFpEF) (HCC) Active Problems:   Acute respiratory failure with hypoxia (HCC)   COPD exacerbation (HCC)   Pleuritic chest pain   Type II diabetes mellitus with renal manifestations (HCC)   Class 2 severe obesity with serious comorbidity and body mass index (BMI) of 39.0 to 39.9 in adult [facility])   Acute pulmonary edema (HCC)   Nicotine dependence   Resolved Problems:   * No resolved hospital problems. *   Hospital Course:   Cory Rivas is a 58 y.o. male with medical history significant of CAD, chronic HFpEF, COPD Gold stage II, HTN, HLD, gout, CKD stage II, presented with worsening of cough wheezing shortness of breath.   Symptoms started 2 weeks ago, patient started to have increasing exertional dyspnea and ported a cough with clear phlegm.  He also noticed about 10 pounds of weight gaining for about 1 month.  He also said that the torsemide has been taking " no longer works" as his urine output has not been as before after taking torsemide in the morning.  But he has  not tried a double dose of his diuresis.  Last night, patient also started to have wheezing and cannot sleep and decided come to ED.  Denies any fever chills no peripheral edema.  No chest pains.  Over the weekend patient also had a flareup of his gout on the right toes, for which he took 1 dose of colchicine and get some relief, he continues to take allopurinol every day.   ED Course: Afebrile, blood pressure 170/90 O2 saturation 87% on room air and stabilized on 2 L.  Chest x-ray showed pulmonary congestion as well as chronic interstitial changes compatible with COPD.  Blood work showed WBC 9.7 hemoglobin 13.9, creatinine 1.4 compared to baseline 1.1-1.3.  K3.7   Patient was given Lasix x 1, IV Solu-Medrol x 1 and DuoNeb x 1 in the ED.         Assessment and Plan:   Acute hypoxic respiratory failure Likely multifactorial from acute on chronic HFpEF as well as acute COPD exacerbation Patient had room air pulse oximetry of 87% and is down to 1 L . Was assessed for home oxygen need Patient Saturations on Room Air at Rest = 93% Patient Saturations on Room Air while Ambulating = 88% Patient Saturations on 1 Liters of oxygen while Ambulating = 95% He will likely be discharged home on 1 L of oxygen with activity.       Acute COPD exacerbation Improved  Continue systemic and inhaled steroids Continue scheduled and as needed bronchodilator therapy Continue antitussives     Acute on chronic HFpEF decompensation Patient had a 2D echocardiogram which was done 01/25 and showed a normal LVEF of 55 to 60% with no regional wall motion abnormalities. IV Lasix has been switched to torsemide Carvedilol has been switched to bisoprolol Check daily weights       History of CAD Status post recent LHC which showed a stable LAD and OMI stenosis.   Cardiology recommended to continue medical management Continue aspirin, Repatha bisoprolol and Crestor       History of mild pulmonary  hypertension History of OSA Not on CPAP due to insurance issue.     Insulin-dependent diabetes mellitus Continue Lantus 15 units daily as well as sliding scale insulin Maintain consistent carbohydrate diet     Morbid obesity BMI 45.1 Complicates overall prognosis and care Lifestyle modification exercises been discussed with patient       Nicotine dependence Smoking cessation was discussed with patient again and he is interested in quitting smoking Will start him on a nicotine transdermal patch  Wt Readings from Last 5 Encounters:  01/27/24 217 lb (98.4 kg)  01/23/24 216 lb 0.8 oz (98 kg)  01/15/24 230 lb (104.3 kg)  01/09/24 224 lb 9.6 oz (101.9 kg)  12/29/23 220 lb (99.8 kg)   BMI Readings from Last 5 Encounters:  01/27/24 42.38 kg/m  01/23/24 42.19 kg/m  01/15/24 37.12 kg/m  01/09/24 36.25 kg/m  12/29/23 35.51 kg/m    F/up with cardiology for CHF f/up     Patient Active Problem List   Diagnosis Date Noted   Nicotine dependence 01/23/2024   Acute pulmonary edema (HCC) 01/22/2024   CHF (congestive heart failure) (HCC) 01/20/2024   COPD exacerbation (HCC) 10/21/2023   History of colon polyps 09/25/2023   Myocardial injury 08/19/2023   Type II diabetes mellitus with renal manifestations (HCC) 08/19/2023   Obesity (BMI 30-39.9) 08/19/2023   HLD (hyperlipidemia) 08/19/2023   Chronic diastolic CHF (congestive heart failure) (HCC) 08/19/2023   Diarrhea 08/19/2023   CAD (coronary artery disease) 08/19/2023   CKD (chronic kidney disease) stage 2, GFR 60-89 ml/min 08/19/2023   Acute on chronic diastolic heart failure (HCC) 05/30/2023   Unstable angina (HCC) 05/29/2023   Pleuritic chest pain 05/27/2023   Type 2 diabetes mellitus with peripheral neuropathy (HCC) 05/27/2023   Gastroesophageal reflux disease without esophagitis 05/27/2023   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   Dyslipidemia 05/27/2023   Acute decompensated heart failure (HCC) 11/23/2022    Hypertensive emergency 11/22/2022   Acute respiratory failure with hypoxia (HCC) 11/22/2022   Elevated troponin 11/22/2022   Acute on chronic heart failure with preserved ejection fraction (HFpEF) (HCC) 08/24/2022   Acute on chronic diastolic congestive heart failure (HCC) 08/23/2022   Class 2 obesity 08/23/2022   Uncontrolled diabetes mellitus with hyperglycemia, with long-term current use of insulin (HCC) 08/23/2022   Grade I diastolic dysfunction 06/08/2022   LVH (left ventricular hypertrophy) 06/08/2022   Accelerated hypertension 06/08/2022   S/P cervical spinal fusion 08/18/2021   Former heavy tobacco smoker 08/18/2021   Gout 08/18/2021   Other chronic pain 10/26/2020   Systolic murmur 10/26/2020   Tobacco use 10/26/2020   Polyp of ascending colon    Rectal polyp    Class 2 severe obesity with serious comorbidity and body mass index (BMI) of 39.0 to 39.9 in adult (HCC) 04/15/2019   Chronic kidney disease (CKD) stage G2/A3, mildly decreased glomerular  filtration rate (GFR) between 60-89 mL/min/1.73 square meter and albuminuria creatinine ratio greater than 300 mg/g 04/15/2019   Proteinuria 04/15/2019   Hyperlipidemia associated with type 2 diabetes mellitus (HCC) 03/18/2019   Diabetes mellitus (HCC) 03/18/2019   Idiopathic chronic gout of multiple sites without tophus 03/18/2019   Essential hypertension 03/18/2019   Dermatitis 03/18/2019   Lumbar degenerative disc disease 11/22/2015   Cervical myelopathy (HCC) 11/22/2015   Osteoarthritis of spine with radiculopathy, lumbar region 11/22/2015   Degenerative disc disease, cervical 11/22/2015   Osteoarthritis of spine with radiculopathy, cervical region 11/22/2015      Current Outpatient Medications:    allopurinol (ZYLOPRIM) 300 MG tablet, TAKE ONE TABLET BY MOUTH DAILY WITH 100 MG TABLET. FOR A TOTAL OF 400 MG DAILY FOR GOUT, Disp: 90 tablet, Rfl: 1   amLODipine (NORVASC) 10 MG tablet, Take 1 tablet (10 mg total) by mouth  daily., Disp: 30 tablet, Rfl: 5   aspirin EC 81 MG tablet, Take 1 tablet (81 mg total) by mouth daily. Swallow whole., Disp: 90 tablet, Rfl: 3   baclofen (LIORESAL) 10 MG tablet, Take 1 tablet (10 mg total) by mouth 3 (three) times daily., Disp: 30 each, Rfl: 0   bisoprolol (ZEBETA) 5 MG tablet, Take 1 tablet (5 mg total) by mouth daily., Disp: 30 tablet, Rfl: 0   budesonide-formoterol (SYMBICORT) 160-4.5 MCG/ACT inhaler, Inhale 2 puffs into the lungs 2 (two) times daily., Disp: 1 each, Rfl: 12   Continuous Glucose Receiver (DEXCOM G7 RECEIVER) DEVI, Dispense one reciever to be used with 10 d devices for IDDM, Disp: 1 each, Rfl: 0   Continuous Glucose Receiver (FREESTYLE LIBRE 3 READER) DEVI, Use with libre 3 device as directed, Disp: 1 each, Rfl: 0   Continuous Glucose Sensor (FREESTYLE LIBRE 3 SENSOR) MISC, 1 Device by Does not apply route every 14 (fourteen) days., Disp: 6 each, Rfl: 2   DULoxetine (CYMBALTA) 30 MG capsule, Take 60 mg (2 capsules) poqam and 30 mg (1 capsule) poqpm daily, Disp: 270 capsule, Rfl: 1   empagliflozin (JARDIANCE) 10 MG TABS tablet, Take 1 tablet (10 mg total) by mouth daily., Disp: 90 tablet, Rfl: 1   Evolocumab (REPATHA SURECLICK) 140 MG/ML SOAJ, Inject 140 mg into the skin every 14 (fourteen) days., Disp: 6 mL, Rfl: 3   ezetimibe (ZETIA) 10 MG tablet, Take 1 tablet (10 mg total) by mouth daily., Disp: 90 tablet, Rfl: 1   GVOKE HYPOPEN 1-PACK 1 MG/0.2ML SOAJ, Inject 1 mg into the muscle once as needed (Hypoglycemia)., Disp: , Rfl:    levocetirizine (XYZAL) 5 MG tablet, TAKE ONE TABLET BY MOUTH EVERY EVENING, Disp: 90 tablet, Rfl: 1   lisinopril (ZESTRIL) 40 MG tablet, Take 1 tablet (40 mg total) by mouth daily., Disp: 90 tablet, Rfl: 1   pantoprazole (PROTONIX) 40 MG tablet, Take 1 tablet (40 mg total) by mouth daily., Disp: 90 tablet, Rfl: 1   pregabalin (LYRICA) 50 MG capsule, Take 1 capsule (50 mg total) by mouth 2 (two) times daily., Disp: 180 capsule, Rfl: 0    rosuvastatin (CRESTOR) 40 MG tablet, Take 1 tablet (40 mg total) by mouth daily., Disp: 90 tablet, Rfl: 1   Tiotropium Bromide Monohydrate (SPIRIVA RESPIMAT) 2.5 MCG/ACT AERS, Inhale 2 puffs into the lungs daily., Disp: 30 each, Rfl: 11   torsemide (DEMADEX) 20 MG tablet, Take 20 mg by mouth 2 (two) times daily., Disp: , Rfl:    TRULICITY 3 MG/0.5ML SOPN, Inject 3 mg into the skin once a  week. SATURDAYS, Disp: , Rfl:    VENTOLIN HFA 108 (90 Base) MCG/ACT inhaler, INHALE TWO PUFFS INTO THE LUNGS EVERY SIX HOURS AS NEEDED FOR WHEEZING OR SHORTNESS OF BREATH, Disp: 18 each, Rfl: 2   fluticasone (FLONASE) 50 MCG/ACT nasal spray, Place 1 spray into both nostrils daily., Disp: 18.2 mL, Rfl: 6   Allergies  Allergen Reactions   Oxycodone Nausea And Vomiting   Entresto [Sacubitril-Valsartan] Nausea Only and Other (See Comments)    Lightheaded and dizzy    Onion Nausea And Vomiting   Nitroglycerin Itching and Rash     Social History   Tobacco Use   Smoking status: Some Days    Current packs/day: 0.00    Average packs/day: 1 pack/day for 37.0 years (37.0 ttl pk-yrs)    Types: Cigarettes    Start date: 10/19/1989    Last attempt to quit: 08/20/2022    Years since quitting: 1.4   Smokeless tobacco: Never   Tobacco comments:    30+ years hx smoking 2 ppd, stopped smoking briefly in April 2022 for a surgery and then restarted smoking about 1/2 ppd last 4-5 months         5-6cigs daily- 04/10/2023  Vaping Use   Vaping status: Never Used  Substance Use Topics   Alcohol use: No   Drug use: No      Chart Review Today: I personally reviewed active problem list, medication list, allergies, family history, social history, health maintenance, notes from last encounter, lab results, imaging with the patient/caregiver today.   Review of Systems  Constitutional: Negative.   HENT: Negative.    Eyes: Negative.   Respiratory: Negative.    Cardiovascular: Negative.   Gastrointestinal: Negative.    Endocrine: Negative.   Genitourinary: Negative.   Musculoskeletal: Negative.   Skin: Negative.   Allergic/Immunologic: Negative.   Neurological: Negative.   Hematological: Negative.   Psychiatric/Behavioral: Negative.    All other systems reviewed and are negative.      Objective:   Vitals:   01/27/24 1353  BP: 136/88  Pulse: 81  Resp: 16  SpO2: 97%  Weight: 217 lb (98.4 kg)  Height: 5' (1.524 m)    Body mass index is 42.38 kg/m.  Physical Exam Vitals and nursing note reviewed.  Constitutional:      Appearance: He is well-developed.  HENT:     Head: Normocephalic and atraumatic.     Nose: Nose normal.  Eyes:     General:        Right eye: No discharge.        Left eye: No discharge.     Conjunctiva/sclera: Conjunctivae normal.  Neck:     Trachea: No tracheal deviation.  Cardiovascular:     Rate and Rhythm: Normal rate and regular rhythm.     Pulses: Normal pulses.     Heart sounds: Normal heart sounds.  Pulmonary:     Effort: Pulmonary effort is normal. No respiratory distress.     Breath sounds: Normal breath sounds. No stridor. No wheezing, rhonchi or rales.  Musculoskeletal:     Right lower leg: No edema.     Left lower leg: No edema.  Skin:    General: Skin is warm and dry.     Findings: No rash.  Neurological:     Mental Status: He is alert.     Motor: No abnormal muscle tone.     Coordination: Coordination normal.  Psychiatric:        Mood and  Affect: Mood normal.        Behavior: Behavior normal.      Results for orders placed or performed during the hospital encounter of 01/20/24  Resp panel by RT-PCR (RSV, Flu A&B, Covid) Anterior Nasal Swab   Collection Time: 01/20/24  6:00 AM   Specimen: Anterior Nasal Swab  Result Value Ref Range   SARS Coronavirus 2 by RT PCR NEGATIVE NEGATIVE   Influenza A by PCR NEGATIVE NEGATIVE   Influenza B by PCR NEGATIVE NEGATIVE   Resp Syncytial Virus by PCR NEGATIVE NEGATIVE  CBC with  Differential/Platelet   Collection Time: 01/20/24  6:00 AM  Result Value Ref Range   WBC 9.7 4.0 - 10.5 K/uL   RBC 4.82 4.22 - 5.81 MIL/uL   Hemoglobin 13.9 13.0 - 17.0 g/dL   HCT 44.0 10.2 - 72.5 %   MCV 88.2 80.0 - 100.0 fL   MCH 28.8 26.0 - 34.0 pg   MCHC 32.7 30.0 - 36.0 g/dL   RDW 36.6 44.0 - 34.7 %   Platelets 195 150 - 400 K/uL   nRBC 0.0 0.0 - 0.2 %   Neutrophils Relative % 73 %   Neutro Abs 7.0 1.7 - 7.7 K/uL   Lymphocytes Relative 19 %   Lymphs Abs 1.8 0.7 - 4.0 K/uL   Monocytes Relative 6 %   Monocytes Absolute 0.6 0.1 - 1.0 K/uL   Eosinophils Relative 2 %   Eosinophils Absolute 0.2 0.0 - 0.5 K/uL   Basophils Relative 0 %   Basophils Absolute 0.0 0.0 - 0.1 K/uL   Immature Granulocytes 0 %   Abs Immature Granulocytes 0.04 0.00 - 0.07 K/uL  Basic metabolic panel   Collection Time: 01/20/24  6:00 AM  Result Value Ref Range   Sodium 139 135 - 145 mmol/L   Potassium 3.7 3.5 - 5.1 mmol/L   Chloride 104 98 - 111 mmol/L   CO2 25 22 - 32 mmol/L   Glucose, Bld 157 (H) 70 - 99 mg/dL   BUN 12 6 - 20 mg/dL   Creatinine, Ser 4.25 (H) 0.61 - 1.24 mg/dL   Calcium 8.2 (L) 8.9 - 10.3 mg/dL   GFR, Estimated 57 (L) >60 mL/min   Anion gap 10 5 - 15  Brain natriuretic peptide   Collection Time: 01/20/24  6:00 AM  Result Value Ref Range   B Natriuretic Peptide 152.3 (H) 0.0 - 100.0 pg/mL  Troponin I (High Sensitivity)   Collection Time: 01/20/24  6:00 AM  Result Value Ref Range   Troponin I (High Sensitivity) 21 (H) <18 ng/L  CK   Collection Time: 01/20/24  7:50 AM  Result Value Ref Range   Total CK 454 (H) 49 - 397 U/L  Uric acid   Collection Time: 01/20/24  7:50 AM  Result Value Ref Range   Uric Acid, Serum 9.3 (H) 3.7 - 8.6 mg/dL  Troponin I (High Sensitivity)   Collection Time: 01/20/24  7:50 AM  Result Value Ref Range   Troponin I (High Sensitivity) 21 (H) <18 ng/L  CBG monitoring, ED   Collection Time: 01/20/24 11:25 AM  Result Value Ref Range    Glucose-Capillary 283 (H) 70 - 99 mg/dL  CBG monitoring, ED   Collection Time: 01/20/24  4:16 PM  Result Value Ref Range   Glucose-Capillary 201 (H) 70 - 99 mg/dL  CBG monitoring, ED   Collection Time: 01/20/24  8:47 PM  Result Value Ref Range   Glucose-Capillary 172 (H) 70 -  99 mg/dL  HIV Antibody (routine testing w rflx)   Collection Time: 01/21/24  5:29 AM  Result Value Ref Range   HIV Screen 4th Generation wRfx Non Reactive Non Reactive  Basic metabolic panel   Collection Time: 01/21/24  5:29 AM  Result Value Ref Range   Sodium 137 135 - 145 mmol/L   Potassium 5.1 3.5 - 5.1 mmol/L   Chloride 103 98 - 111 mmol/L   CO2 26 22 - 32 mmol/L   Glucose, Bld 167 (H) 70 - 99 mg/dL   BUN 24 (H) 6 - 20 mg/dL   Creatinine, Ser 1.61 (H) 0.61 - 1.24 mg/dL   Calcium 8.6 (L) 8.9 - 10.3 mg/dL   GFR, Estimated 50 (L) >60 mL/min   Anion gap 8 5 - 15  CBG monitoring, ED   Collection Time: 01/21/24  7:34 AM  Result Value Ref Range   Glucose-Capillary 134 (H) 70 - 99 mg/dL  CBG monitoring, ED   Collection Time: 01/21/24 11:26 AM  Result Value Ref Range   Glucose-Capillary 215 (H) 70 - 99 mg/dL  CBG monitoring, ED   Collection Time: 01/21/24  4:49 PM  Result Value Ref Range   Glucose-Capillary 177 (H) 70 - 99 mg/dL  Glucose, capillary   Collection Time: 01/21/24  9:47 PM  Result Value Ref Range   Glucose-Capillary 132 (H) 70 - 99 mg/dL  Basic metabolic panel   Collection Time: 01/22/24  5:10 AM  Result Value Ref Range   Sodium 136 135 - 145 mmol/L   Potassium 4.0 3.5 - 5.1 mmol/L   Chloride 101 98 - 111 mmol/L   CO2 26 22 - 32 mmol/L   Glucose, Bld 120 (H) 70 - 99 mg/dL   BUN 41 (H) 6 - 20 mg/dL   Creatinine, Ser 0.96 (H) 0.61 - 1.24 mg/dL   Calcium 8.6 (L) 8.9 - 10.3 mg/dL   GFR, Estimated 50 (L) >60 mL/min   Anion gap 9 5 - 15  Glucose, capillary   Collection Time: 01/22/24  7:55 AM  Result Value Ref Range   Glucose-Capillary 114 (H) 70 - 99 mg/dL  Glucose, capillary    Collection Time: 01/22/24 11:39 AM  Result Value Ref Range   Glucose-Capillary 138 (H) 70 - 99 mg/dL  Basic metabolic panel   Collection Time: 01/22/24  1:09 PM  Result Value Ref Range   Sodium 135 135 - 145 mmol/L   Potassium 4.3 3.5 - 5.1 mmol/L   Chloride 99 98 - 111 mmol/L   CO2 29 22 - 32 mmol/L   Glucose, Bld 136 (H) 70 - 99 mg/dL   BUN 45 (H) 6 - 20 mg/dL   Creatinine, Ser 0.45 (H) 0.61 - 1.24 mg/dL   Calcium 8.5 (L) 8.9 - 10.3 mg/dL   GFR, Estimated 46 (L) >60 mL/min   Anion gap 7 5 - 15  Glucose, capillary   Collection Time: 01/22/24  4:22 PM  Result Value Ref Range   Glucose-Capillary 240 (H) 70 - 99 mg/dL  Glucose, capillary   Collection Time: 01/22/24  9:04 PM  Result Value Ref Range   Glucose-Capillary 69 (L) 70 - 99 mg/dL  Glucose, capillary   Collection Time: 01/22/24  9:48 PM  Result Value Ref Range   Glucose-Capillary 162 (H) 70 - 99 mg/dL  Glucose, capillary   Collection Time: 01/23/24 12:12 AM  Result Value Ref Range   Glucose-Capillary 87 70 - 99 mg/dL  Glucose, capillary   Collection Time: 01/23/24  8:41 AM  Result Value Ref Range   Glucose-Capillary 191 (H) 70 - 99 mg/dL  Basic metabolic panel   Collection Time: 01/23/24 10:40 AM  Result Value Ref Range   Sodium 135 135 - 145 mmol/L   Potassium 3.6 3.5 - 5.1 mmol/L   Chloride 99 98 - 111 mmol/L   CO2 25 22 - 32 mmol/L   Glucose, Bld 153 (H) 70 - 99 mg/dL   BUN 46 (H) 6 - 20 mg/dL   Creatinine, Ser 4.09 (H) 0.61 - 1.24 mg/dL   Calcium 8.4 (L) 8.9 - 10.3 mg/dL   GFR, Estimated 44 (L) >60 mL/min   Anion gap 11 5 - 15       Assessment & Plan:     ICD-10-CM   1. Encounter for examination following treatment at hospital  Z09 Basic Metabolic Panel Without GFR   for both COPD and CHF causing respiratory failure, sx improved, lungs CTA A&P, VSS, all records and labs reviewed    2. Acute mid back pain  M54.9 baclofen (LIORESAL) 10 MG tablet   wanted the same med they gave him inpt with tylenol  which helped his neck and he slept well - was baclofen same as Rx from PCP, refills ordered    3. Chronic neck pain  M54.2 baclofen (LIORESAL) 10 MG tablet   G89.29    see above    4. Chronic kidney disease (CKD) stage G2/A3, mildly decreased glomerular filtration rate (GFR) between 60-89 mL/min/1.73 square meter and albuminuria creatinine ratio greater than 300 mg/g  N18.2    recheck renal function    5. Chronic diastolic CHF (congestive heart failure) (HCC)  I50.32 Basic Metabolic Panel Without GFR   keep CHF/cardiology f/up with Brian Campanile this week to further adjust meds          Adeline Hone, PA-C 01/27/24 2:13 PM

## 2024-01-28 ENCOUNTER — Encounter: Payer: Self-pay | Admitting: Family Medicine

## 2024-01-28 ENCOUNTER — Telehealth: Payer: Self-pay | Admitting: Family

## 2024-01-28 LAB — BASIC METABOLIC PANEL WITHOUT GFR
BUN: 15 mg/dL (ref 7–25)
CO2: 29 mmol/L (ref 20–32)
Calcium: 9.2 mg/dL (ref 8.6–10.3)
Chloride: 104 mmol/L (ref 98–110)
Creat: 1.22 mg/dL (ref 0.70–1.30)
Glucose, Bld: 118 mg/dL — ABNORMAL HIGH (ref 65–99)
Potassium: 3.8 mmol/L (ref 3.5–5.3)
Sodium: 141 mmol/L (ref 135–146)

## 2024-01-28 NOTE — Progress Notes (Unsigned)
 Advanced [facility] Failure Clinic Note   PCP: Danelle Berry, PA-C (last seen 12/24) Primary Cardiologist: Debbe Odea, MD/ Eula Listen, PA (last seen 01/25)  Chief Complaint: fatigue  HPI:   Cory Rivas is a 58 y/o male with a history of DM, hyperlipidemia, tracheobronchomalacia, nonobstructive CAD, HTN, gout, COPD, asthma, CKD, dyslipidemia, tobacco use and chronic heart failure.   Admitted 08/23/22 due to dyspnea, productive cough of brown sputum, mild sore throat, fatigue, dizziness and chest soreness. Steroid burst given. Initially given IV lasix with transition to oral diuretics. Lisinopril held due to AKI. Discharged after 2 days. Admitted 11/22/22 due to acute on chronic HF. Weaned off of bipap.Was in the ED 03/01/23 due to neck pain after falling off his tractor after he hit a stump. He landed on his right side.Head CT/ cervical spine xrays were negative. Was in the ED 03/09/23 due to atypical chest pain. Chest CTA negative for PE. Was in the ED 03/22/23 due to nonspecific chest pain. Workup negative. Was in the ED 03/23/23 due to hyperglycemia with glucose of >400.    Admitted 05/27/23 due to acute onset of chest pain, shortness of breath and diaphoresis. Blood pressure was significantly elevated to 204/102 on admission. Troponins were mildly elevated up to 26.  Cardiology was consulted and he underwent cardiac catheterization 8/14 which revealed non-obstructive coronary artery disease. Meds adjusted. Chest x-ray without acute cardiopulmonary process. Echo on 05/28/2023 showed an EF of 50 to 55%, no regional wall motion abnormalities, moderate LVH, grade 3 diastolic dysfunction, elevated left atrial pressure, normal RV systolic function and ventricular cavity size, mildly dilated left atrium, mild mitral regurgitation, aortic valve sclerosis without evidence of stenosis, and an estimated right atrial pressure of 3 mmHg.   Admitted 08/19/23 with chest pain/ pressure for 2 days located in the front chest.  Worse with deep breath. Has shortness of breath and cough along with this. Negative D-dimer. Troponin 23 => 22. 1 dose of IV Lasix as BNP 143.9. Glucose 409. Was in the ED 08/31/23 with central chest pressure along with shortness of breath & cough. Labs reassuring with normal troponin. Negative D-dimer. CXR concerning for pneumonia so antibiotics provided.   Admitted 10/21/23 due to acute hypoxic respiratory failure secondary to COPD exacerbation and acute on chronic HFpEF. Found to have worsening CKD. High-sensitivity troponin 21 with a delta troponin of 20 subsequently trended to 19. BNP 331. D-dimer negative. Supplemental oxygen given. Initially given IV lasix with transition to increased home torsemide. Lisinopril, spironolactone, and torsemide were held due to AKI.          Echo 11/04/20: EF 60-65% with Grade I DD, mild LVH/ LAE and normal PA pressure of 31.6 mmHg Echo 08/23/22: EF of 60-65% along with mild LVH & mild Cory.  Echo 05/28/23: EF 50-55% with moderate LVH, Grade III DD and mild Cory  RHC/LHC 05/29/23: Moderate, nonobstructive coronary artery disease with sequential 50-60% ostial and proximal LAD stenoses that are not hemodynamically significant (RFR = 0.93).  There is also a 40% ostial stenosis of a large first OM branch.  Nondominant RCA is without significant disease. Upper normal to mildly elevated left and right heart filling pressures. Mild pulmonary hypertension. Borderline low Fick cardiac output/index. Mild aortic stenosis.  He presents today for a HF follow-up visit with a chief complaint of moderate fatigue with exertion. Has constant chest discomfort, weakness, constant dizziness/ lightheadedness, decreased appetite and continued neck pain along with this. Denies any shortness of breath today, palpitations, cough, edema or  weight gain. Sleeping in recliner due to neck pain. Says that some days, he gets so short of breath that he can't walk from his bedroom to the bathroom but then  other days, like today, he feels good and was able to walk into the office from the parking lot without having to stop and rest.   Glucose running 178-200's. Did not take any of his medications today as he is having a R/LHC later today.   ROS: All systems negative except as listed in HPI, PMH and Problem List.  SH:  Social History   Socioeconomic History   Marital status: Divorced    Spouse name: Not on file   Number of children: 0   Years of education: 9   Highest education level: 9th grade  Occupational History   Occupation: disability  Tobacco Use   Smoking status: Some Days    Current packs/day: 0.00    Average packs/day: 1 pack/day for 37.0 years (37.0 ttl pk-yrs)    Types: Cigarettes    Start date: 10/19/1989    Last attempt to quit: 08/20/2022    Years since quitting: 1.4   Smokeless tobacco: Never   Tobacco comments:    30+ years hx smoking 2 ppd, stopped smoking briefly in April 2022 for a surgery and then restarted smoking about 1/2 ppd last 4-5 months         5-6cigs daily- 04/10/2023  Vaping Use   Vaping status: Never Used  Substance and Sexual Activity   Alcohol use: No   Drug use: No   Sexual activity: Not Currently    Partners: Female  Other Topics Concern   Not on file  Social History Narrative   Lives with sister Cory Rivas, brother in law Cory Rivas), and his mother - Cory Rivas   Social Drivers of Health   Financial Resource Strain: High Risk (01/21/2024)   Overall Financial Resource Strain (CARDIA)    Difficulty of Paying Living Expenses: Hard  Food Insecurity: No Food Insecurity (01/21/2024)   Hunger Vital Sign    Worried About Running Out of Food in the Last Year: Never true    Ran Out of Food in the Last Year: Never true  Transportation Needs: No Transportation Needs (01/21/2024)   PRAPARE - Administrator, Civil Service (Medical): No    Lack of Transportation (Non-Medical): No  Physical Activity: Insufficiently Active (12/06/2022)   Exercise  Vital Sign    Days of Exercise per Week: 3 days    Minutes of Exercise per Session: 10 min  Stress: No Stress Concern Present (12/06/2022)   [employer] of Occupational Health - Occupational Stress Questionnaire    Feeling of Stress : Only a little  Social Connections: Socially Isolated (12/06/2022)   Social Connection and Isolation Panel [NHANES]    Frequency of Communication with Friends and Family: More than three times a week    Frequency of Social Gatherings with Friends and Family: Twice a week    Attends Religious Services: Never    Database administrator or Organizations: No    Attends Banker Meetings: Never    Marital Status: Divorced  Catering manager Violence: Not At Risk (01/20/2024)   Humiliation, Afraid, Rape, and Kick questionnaire    Fear of Current or Ex-Partner: No    Emotionally Abused: No    Physically Abused: No    Sexually Abused: No    FH:  Family History  Problem Relation Age of Onset   Heart  disease Mother    Diabetes Mother    Hyperlipidemia Mother    Hypertension Mother    Heart attack Mother    Lung cancer Father    Hypertension Father    Stroke Father    AAA (abdominal aortic aneurysm) Maternal Grandmother    Heart attack Maternal Grandmother    Diabetes Maternal Grandfather     Past Medical History:  Diagnosis Date   Bulging of cervical intervertebral disc    CHF (congestive heart failure) (HCC)    COPD (chronic obstructive pulmonary disease) (HCC)    Diabetes mellitus without complication (HCC)    Gout    High cholesterol    Hypertension    Stress due to family tension 04/15/2019    Current Outpatient Medications  Medication Sig Dispense Refill   allopurinol (ZYLOPRIM) 300 MG tablet TAKE ONE TABLET BY MOUTH DAILY WITH 100 MG TABLET. FOR A TOTAL OF 400 MG DAILY FOR GOUT 90 tablet 1   amLODipine (NORVASC) 10 MG tablet Take 1 tablet (10 mg total) by mouth daily. 30 tablet 5   aspirin EC 81 MG tablet Take 1 tablet (81 mg  total) by mouth daily. Swallow whole. 90 tablet 3   baclofen (LIORESAL) 10 MG tablet Take 1 tablet (10 mg total) by mouth 3 (three) times daily as needed for muscle spasms. 90 tablet 3   bisoprolol (ZEBETA) 5 MG tablet Take 1 tablet (5 mg total) by mouth daily. 30 tablet 0   budesonide-formoterol (SYMBICORT) 160-4.5 MCG/ACT inhaler Inhale 2 puffs into the lungs 2 (two) times daily. 1 each 12   Continuous Glucose Receiver (DEXCOM G7 RECEIVER) DEVI Dispense one reciever to be used with 10 d devices for IDDM 1 each 0   Continuous Glucose Receiver (FREESTYLE LIBRE 3 READER) DEVI Use with libre 3 device as directed 1 each 0   Continuous Glucose Sensor (FREESTYLE LIBRE 3 SENSOR) MISC 1 Device by Does not apply route every 14 (fourteen) days. 6 each 2   DULoxetine (CYMBALTA) 30 MG capsule Take 60 mg (2 capsules) poqam and 30 mg (1 capsule) poqpm daily 270 capsule 1   empagliflozin (JARDIANCE) 10 MG TABS tablet Take 1 tablet (10 mg total) by mouth daily. 90 tablet 1   Evolocumab (REPATHA SURECLICK) 140 MG/ML SOAJ Inject 140 mg into the skin every 14 (fourteen) days. 6 mL 3   ezetimibe (ZETIA) 10 MG tablet Take 1 tablet (10 mg total) by mouth daily. 90 tablet 1   fluticasone (FLONASE) 50 MCG/ACT nasal spray Place 1 spray into both nostrils daily. 18.2 mL 6   GVOKE HYPOPEN 1-PACK 1 MG/0.2ML SOAJ Inject 1 mg into the muscle once as needed (Hypoglycemia).     levocetirizine (XYZAL) 5 MG tablet TAKE ONE TABLET BY MOUTH EVERY EVENING 90 tablet 1   lisinopril (ZESTRIL) 40 MG tablet Take 1 tablet (40 mg total) by mouth daily. 90 tablet 1   pantoprazole (PROTONIX) 40 MG tablet Take 1 tablet (40 mg total) by mouth daily. 90 tablet 1   pregabalin (LYRICA) 50 MG capsule Take 1 capsule (50 mg total) by mouth 2 (two) times daily. 180 capsule 0   rosuvastatin (CRESTOR) 40 MG tablet Take 1 tablet (40 mg total) by mouth daily. 90 tablet 1   Tiotropium Bromide Monohydrate (SPIRIVA RESPIMAT) 2.5 MCG/ACT AERS Inhale 2 puffs  into the lungs daily. 30 each 11   torsemide (DEMADEX) 20 MG tablet Take 20 mg by mouth 2 (two) times daily.     TRULICITY 3  MG/0.5ML SOPN Inject 3 mg into the skin once a week. SATURDAYS     VENTOLIN HFA 108 (90 Base) MCG/ACT inhaler INHALE TWO PUFFS INTO THE LUNGS EVERY SIX HOURS AS NEEDED FOR WHEEZING OR SHORTNESS OF BREATH 18 each 2   No current facility-administered medications for this visit.   There were no vitals filed for this visit.  Wt Readings from Last 3 Encounters:  01/27/24 217 lb (98.4 kg)  01/23/24 216 lb 0.8 oz (98 kg)  01/15/24 230 lb (104.3 kg)   Lab Results  Component Value Date   CREATININE 1.22 01/27/2024   CREATININE 1.80 (H) 01/23/2024   CREATININE 1.71 (H) 01/22/2024    PHYSICAL EXAM:  General:  Well appearing. No resp difficulty.  HEENT: negative Neck: supple. JVP flat although difficult to assess due to body habitus. No lymphadenopathy or thryomegaly appreciated. Cor: PMI normal. Regular rate & rhythm. No rubs, gallops. 2/6 systolic murmur at LSB. Lungs: clear Abdomen: soft, nontender, nondistended. No hepatosplenomegaly. No bruits or masses.  Extremities: no cyanosis, clubbing, rash, trace pitting edema bilateral lower legs Neuro: alert & oriented x 3, cranial nerves grossly intact. Moves all 4 extremities w/o difficulty. Affect pleasant.   ECG: not done  ReDs:  38% (previous reading 37%)   ASSESSMENT & PLAN:  1: NICM with preserved ejection fraction with LVH- - suscpect due to HTN and OSA - NYHA class III - euvolemic today - weighing daily; reminded to call for an overnight weight gain of >2 pounds or a weekly weight gain of > 5 pounds - weight down 12 pounds from last visit here 2 weeks ago; reports a decreased appetite since recent admission - ReDs 38% (previous reading was 37%) - Echo 11/04/20: EF 60-65% with Grade I DD, mild LVH/ LAE and normal PA pressure of 31.6 mmHg - Echo 08/23/22: EF of 60-65% along with mild LVH & mild Cory. - Echo  05/28/23: EF 50-55% with moderate LVH, Grade III DD and mild Cory - continue carvedilol 25mg  BID - continue jardiance 10mg  QD - continue torsemide 40mg  daily with additional 40mg  on M, W, F - lisinopril and spironolactone continues to be hold since discharge; having R/LHC later today so will defer resumption until that is done - drinking less fluids and is trying to keep it closer to 60-64 ounces - BNP 10/28/23 was 39.49  2: HTN- - BP 170/83; no meds taken yet today - continue amlodipine 5mg  daily - saw PCP Zane Herald) 12/24  - BMP 10/28/23 reviewed and showed sodium 139, potassium 5.0, creatinine 1.41 and GFR 58  3: DM- - A1c 08/09/23 was 9.8% - saw endocrinology (Rhinehart) 07/24 - 03/21/23 microalbumin/ creat ratio was 1338 - home glucose running 170's-200's  4: Reactive airway disease- - saw pulmonology (Dgayli) 06/24 - using spiriva  - continues to not smoke - CPAP was returned as he wasn't wearing it long enough each night  5: Hyperlipidemia- - continue ezetimibe 10mg  daily - continue rosuvastatin 40mg  daily - lipid panel 08/09/23 showed LDL 147, triglycerides 404  6: CAD- - saw cardiology (Dunn) 01/25 - R/LHC later today - continue ezetimibe 10mg  daily - continue rosuvastatin 40mg  daily - RHC/LHC 05/29/23:   Moderate, nonobstructive coronary artery disease with sequential 50-60% ostial and proximal LAD stenoses that are not hemodynamically significant (RFR = 0.93).  There is also a 40% ostial stenosis of a large first OM branch.  Nondominant RCA is without   significant disease.   Upper normal to mildly elevated left and right  heart filling pressures.   Mild pulmonary hypertension.   Borderline low Fick cardiac output/index.   Mild aortic stenosis.   Return in 3 months, sooner if needed.     Charlette Console, FNP 01/28/24

## 2024-01-28 NOTE — Telephone Encounter (Incomplete)
 Called to confirm/remind patient of their appointment at the [facility] Clinic on 01/29/24***.   Appointment:   [x] Confirmed  [] Left mess   [] No answer/No voice mail  [] Phone not in service  Patient reminded to bring all medications and/or complete list.  Confirmed patient has transportation. Gave directions, instructed to utilize valet parking.

## 2024-01-29 ENCOUNTER — Other Ambulatory Visit: Payer: Self-pay

## 2024-01-29 ENCOUNTER — Encounter: Payer: Self-pay | Admitting: Family

## 2024-01-29 ENCOUNTER — Ambulatory Visit: Payer: Medicaid Other | Attending: Family | Admitting: Family

## 2024-01-29 VITALS — BP 156/94 | HR 74 | Wt 219.0 lb

## 2024-01-29 DIAGNOSIS — R0602 Shortness of breath: Secondary | ICD-10-CM | POA: Diagnosis not present

## 2024-01-29 DIAGNOSIS — E785 Hyperlipidemia, unspecified: Secondary | ICD-10-CM | POA: Diagnosis not present

## 2024-01-29 DIAGNOSIS — Z7985 Long-term (current) use of injectable non-insulin antidiabetic drugs: Secondary | ICD-10-CM | POA: Insufficient documentation

## 2024-01-29 DIAGNOSIS — I5032 Chronic diastolic (congestive) heart failure: Secondary | ICD-10-CM | POA: Diagnosis not present

## 2024-01-29 DIAGNOSIS — I2510 Atherosclerotic heart disease of native coronary artery without angina pectoris: Secondary | ICD-10-CM | POA: Insufficient documentation

## 2024-01-29 DIAGNOSIS — I272 Pulmonary hypertension, unspecified: Secondary | ICD-10-CM | POA: Insufficient documentation

## 2024-01-29 DIAGNOSIS — Z7984 Long term (current) use of oral hypoglycemic drugs: Secondary | ICD-10-CM | POA: Diagnosis not present

## 2024-01-29 DIAGNOSIS — F17210 Nicotine dependence, cigarettes, uncomplicated: Secondary | ICD-10-CM | POA: Diagnosis not present

## 2024-01-29 DIAGNOSIS — Z794 Long term (current) use of insulin: Secondary | ICD-10-CM | POA: Diagnosis not present

## 2024-01-29 DIAGNOSIS — J4489 Other specified chronic obstructive pulmonary disease: Secondary | ICD-10-CM | POA: Diagnosis not present

## 2024-01-29 DIAGNOSIS — I428 Other cardiomyopathies: Secondary | ICD-10-CM | POA: Insufficient documentation

## 2024-01-29 DIAGNOSIS — I10 Essential (primary) hypertension: Secondary | ICD-10-CM | POA: Diagnosis not present

## 2024-01-29 DIAGNOSIS — R058 Other specified cough: Secondary | ICD-10-CM | POA: Insufficient documentation

## 2024-01-29 DIAGNOSIS — E1122 Type 2 diabetes mellitus with diabetic chronic kidney disease: Secondary | ICD-10-CM | POA: Insufficient documentation

## 2024-01-29 DIAGNOSIS — E119 Type 2 diabetes mellitus without complications: Secondary | ICD-10-CM

## 2024-01-29 DIAGNOSIS — I130 Hypertensive heart and chronic kidney disease with heart failure and stage 1 through stage 4 chronic kidney disease, or unspecified chronic kidney disease: Secondary | ICD-10-CM | POA: Diagnosis not present

## 2024-01-29 DIAGNOSIS — E782 Mixed hyperlipidemia: Secondary | ICD-10-CM | POA: Diagnosis not present

## 2024-01-29 DIAGNOSIS — N189 Chronic kidney disease, unspecified: Secondary | ICD-10-CM | POA: Insufficient documentation

## 2024-01-29 DIAGNOSIS — J45909 Unspecified asthma, uncomplicated: Secondary | ICD-10-CM

## 2024-01-29 DIAGNOSIS — Z79899 Other long term (current) drug therapy: Secondary | ICD-10-CM | POA: Insufficient documentation

## 2024-01-29 MED ORDER — SPIRIVA RESPIMAT 2.5 MCG/ACT IN AERS
2.0000 | INHALATION_SPRAY | Freq: Every day | RESPIRATORY_TRACT | 11 refills | Status: DC
  Filled 2024-01-31 – 2024-02-18 (×3): qty 4, 30d supply, fill #0
  Filled 2024-03-03 – 2024-03-17 (×5): qty 4, 30d supply, fill #1
  Filled 2024-04-09: qty 4, 30d supply, fill #2
  Filled 2024-05-29 – 2024-06-01 (×2): qty 4, 30d supply, fill #3

## 2024-01-29 MED ORDER — REPATHA 140 MG/ML ~~LOC~~ SOSY: 140.0000 mg | PREFILLED_SYRINGE | SUBCUTANEOUS | 11 refills | Status: DC

## 2024-01-29 MED ORDER — EZETIMIBE 10 MG PO TABS
10.0000 mg | ORAL_TABLET | Freq: Every day | ORAL | 0 refills | Status: DC
  Filled 2024-01-31 – 2024-02-18 (×2): qty 90, 90d supply, fill #0
  Filled 2024-02-19: qty 30, 30d supply, fill #0
  Filled 2024-03-04 – 2024-03-17 (×6): qty 30, 30d supply, fill #1
  Filled 2024-04-09: qty 30, 30d supply, fill #2

## 2024-01-29 MED ORDER — SPIRONOLACTONE 25 MG PO TABS
25.0000 mg | ORAL_TABLET | Freq: Every day | ORAL | 3 refills | Status: AC
  Filled 2024-01-29: qty 90, 90d supply, fill #0
  Filled 2024-04-09: qty 90, 90d supply, fill #1
  Filled 2024-07-14: qty 30, 30d supply, fill #2
  Filled 2024-08-25: qty 30, 30d supply, fill #3
  Filled 2024-09-25: qty 30, 30d supply, fill #4
  Filled 2024-11-09 – 2024-11-13 (×2): qty 30, 30d supply, fill #5

## 2024-01-29 MED ORDER — GVOKE HYPOPEN 1-PACK 1 MG/0.2ML ~~LOC~~ SOAJ: 1.0000 mg | SUBCUTANEOUS | 0 refills | Status: DC | PRN

## 2024-01-29 MED ORDER — DULOXETINE HCL 30 MG PO CPEP
ORAL_CAPSULE | ORAL | 0 refills | Status: DC
  Filled 2024-01-31: qty 270, 90d supply, fill #0
  Filled 2024-02-18: qty 90, 30d supply, fill #0
  Filled 2024-02-18: qty 270, 90d supply, fill #0
  Filled 2024-03-04 – 2024-03-17 (×5): qty 90, 30d supply, fill #1
  Filled 2024-04-09: qty 90, 30d supply, fill #2

## 2024-01-29 MED ORDER — AMLODIPINE BESYLATE 10 MG PO TABS
10.0000 mg | ORAL_TABLET | Freq: Every day | ORAL | 3 refills | Status: DC
  Filled 2024-01-31 – 2024-02-18 (×3): qty 30, 30d supply, fill #0
  Filled 2024-03-04 – 2024-03-17 (×5): qty 30, 30d supply, fill #1
  Filled 2024-04-09: qty 30, 30d supply, fill #2

## 2024-01-29 MED ORDER — TRULICITY 3 MG/0.5ML ~~LOC~~ SOAJ
3.0000 mg | SUBCUTANEOUS | 10 refills | Status: AC
Start: 2023-09-05 — End: ?
  Filled 2024-01-31 – 2024-02-18 (×2): qty 2, 28d supply, fill #0
  Filled 2024-03-04 – 2024-03-17 (×4): qty 2, 28d supply, fill #1
  Filled 2024-04-09: qty 2, 28d supply, fill #2

## 2024-01-29 MED ORDER — CARVEDILOL 25 MG PO TABS: 25.0000 mg | ORAL_TABLET | Freq: Two times a day (BID) | ORAL | 0 refills | Status: DC

## 2024-01-29 MED ORDER — ROSUVASTATIN CALCIUM 40 MG PO TABS
40.0000 mg | ORAL_TABLET | Freq: Every day | ORAL | 0 refills | Status: DC
  Filled 2024-01-31 – 2024-02-18 (×2): qty 90, 90d supply, fill #0
  Filled 2024-02-18: qty 30, 30d supply, fill #0
  Filled 2024-03-04 – 2024-03-17 (×5): qty 30, 30d supply, fill #1
  Filled 2024-04-09: qty 30, 30d supply, fill #2

## 2024-01-29 NOTE — Progress Notes (Signed)
 [facility] - HEART FAILURE CLINIC - PHARMACIST COUNSELING NOTE  Adherence Assessment  Do you ever forget to take your medication? [x] Yes [] No  Do you ever skip doses due to side effects? [] Yes [x] No  Do you have trouble affording your medicines? [] Yes [x] No  Are you ever unable to pick up your medication due to transportation difficulties? [] Yes [x] No  Do you ever stop taking your medications because you don't believe they are helping? [] Yes [x] No  Do you check your weight daily?  Weight - 219 lbs  [x] Yes [] No  Do you check your blood pressure daily?  156/94 - even higher  [x] Yes [] No  Adherence strategy: Bubble pack   Barriers to obtaining medications:    Vital signs: HR ***, BP ***, weight (pounds) *** ECHO: Date ***, EF ***, notes *** Cath: Date ***, EF ***, notes *** Renal function: Date ***, GFR ***  Current Guideline-Directed Medical Therapy/Evidence Based Medicine  ACE/ARB/ARNI: {AR_ARNI/ACEi/ARB:25599} Target dose:  Beta Blocker: {AR_Beta blocker:25598} Target dose:  Aldosterone Antagonist: {AR_Mineralocorticoid receptor antagonists:25602} Target dose:  SGLT2i: {AR_SGLT2i:25603} Target dose:   Diuretic: {AR_Diuretics:25604}  Others:   ASSESSMENT *** year old {Gender Description:210950033} who presents to the HF clinic ***. PMH is significant for ***.   Recent ED visit or hospitalization (past 6 months): Date - ***, CC - *** , Admission Dx (if applicable) - ***    COUNSELING POINTS/CLINICAL PEARLS  Can use the HFCMEDCOUNSELING dot phrase here   PLAN  [place] pre-packs; stopped the spironolactone  Bisoprolol; no complaints   Lay flat - dry cough   Can't walk as far as usual; SOB   Time spent: 20 minutes  Pansy Bogus, PharmD Pharmacy Resident  01/29/2024 9:44 AM

## 2024-01-29 NOTE — Telephone Encounter (Signed)
 Patient would like to transition to [facility] pill pack services due to cost of current service. Patient given information on how to initiate. Patient will return in 1 month to clinic. IF he is having issues with transitioning services to [facility], will assist him face to face.

## 2024-01-29 NOTE — Patient Instructions (Signed)
 Medication Changes:  START Spironolactone 25mg  (1 tab) every day  Follow-Up in: Please follow up with the Advanced [facility] Failure Clinic in 1 month.   At the [facility], you and your health needs are our priority. We have a designated team specialized in the treatment of Heart Failure. This Care Team includes your primary Heart Failure Specialized Cardiologist (physician), Advanced Practice Providers (APPs- Physician Assistants and Nurse Practitioners), and Pharmacist who all work together to provide you with the care you need, when you need it.   You may see any of the following providers on your designated Care Team at your next follow up:  Dr. Jules Oar Dr. Peder Bourdon Dr. Alwin Baars Dr. Judyth Nunnery Shawnee Dellen, FNP Bevely Brush, RPH-CPP  Please be sure to bring in all your medications bottles to every appointment.   Need to Contact Us :  If you have any questions or concerns before your next appointment please send us  a message through [place] or call our office at [phone].    TO LEAVE A MESSAGE FOR THE NURSE SELECT OPTION 2, PLEASE LEAVE A MESSAGE INCLUDING: YOUR NAME DATE OF BIRTH CALL BACK NUMBER REASON FOR CALL**this is important as we prioritize the call backs  YOU WILL RECEIVE A CALL BACK THE SAME DAY AS LONG AS YOU CALL BEFORE 4:00 PM

## 2024-01-30 ENCOUNTER — Ambulatory Visit: Admitting: "Endocrinology

## 2024-01-31 ENCOUNTER — Other Ambulatory Visit: Payer: Self-pay

## 2024-02-04 ENCOUNTER — Other Ambulatory Visit: Payer: Self-pay

## 2024-02-10 ENCOUNTER — Other Ambulatory Visit: Payer: Self-pay | Admitting: Family Medicine

## 2024-02-10 ENCOUNTER — Other Ambulatory Visit: Payer: Self-pay

## 2024-02-10 ENCOUNTER — Ambulatory Visit: Payer: Self-pay | Admitting: Family Medicine

## 2024-02-10 ENCOUNTER — Encounter: Payer: Self-pay | Admitting: Family Medicine

## 2024-02-10 VITALS — BP 132/80 | HR 71 | Resp 16 | Ht 60.0 in | Wt 223.0 lb

## 2024-02-10 DIAGNOSIS — R0602 Shortness of breath: Secondary | ICD-10-CM

## 2024-02-10 DIAGNOSIS — Z794 Long term (current) use of insulin: Secondary | ICD-10-CM | POA: Diagnosis not present

## 2024-02-10 DIAGNOSIS — E1165 Type 2 diabetes mellitus with hyperglycemia: Secondary | ICD-10-CM

## 2024-02-10 DIAGNOSIS — H1013 Acute atopic conjunctivitis, bilateral: Secondary | ICD-10-CM

## 2024-02-10 DIAGNOSIS — Z5181 Encounter for therapeutic drug level monitoring: Secondary | ICD-10-CM | POA: Diagnosis not present

## 2024-02-10 DIAGNOSIS — I5032 Chronic diastolic (congestive) heart failure: Secondary | ICD-10-CM

## 2024-02-10 DIAGNOSIS — J3089 Other allergic rhinitis: Secondary | ICD-10-CM

## 2024-02-10 DIAGNOSIS — J45901 Unspecified asthma with (acute) exacerbation: Secondary | ICD-10-CM

## 2024-02-10 MED ORDER — CROMOLYN SODIUM 4 % OP SOLN
1.0000 [drp] | Freq: Four times a day (QID) | OPHTHALMIC | 2 refills | Status: AC | PRN
  Filled 2024-02-10: qty 10, 50d supply, fill #0

## 2024-02-10 MED ORDER — CROMOLYN SODIUM 4 % OP SOLN: 1.0000 [drp] | Freq: Four times a day (QID) | OPHTHALMIC | 2 refills | Status: DC | PRN

## 2024-02-10 MED ORDER — FLUTICASONE PROPIONATE 50 MCG/ACT NA SUSP
1.0000 | Freq: Every day | NASAL | 6 refills | Status: AC
  Filled 2024-02-10: qty 16, 30d supply, fill #0

## 2024-02-10 MED ORDER — IPRATROPIUM-ALBUTEROL 0.5-2.5 (3) MG/3ML IN SOLN: 3.0000 mL | Freq: Four times a day (QID) | RESPIRATORY_TRACT | 1 refills | Status: DC | PRN

## 2024-02-10 MED ORDER — FLUTICASONE PROPIONATE 50 MCG/ACT NA SUSP: 1.0000 | Freq: Every day | NASAL | 6 refills | Status: DC

## 2024-02-10 MED ORDER — LEVOCETIRIZINE DIHYDROCHLORIDE 5 MG PO TABS
5.0000 mg | ORAL_TABLET | Freq: Every evening | ORAL | 1 refills | Status: DC
  Filled 2024-02-10: qty 30, 30d supply, fill #0
  Filled 2024-03-04 – 2024-03-17 (×5): qty 30, 30d supply, fill #1
  Filled 2024-04-09: qty 30, 30d supply, fill #2
  Filled 2024-05-29 – 2024-06-01 (×2): qty 30, 30d supply, fill #3
  Filled 2024-07-09 – 2024-07-14 (×2): qty 30, 30d supply, fill #4
  Filled 2024-08-25: qty 30, 30d supply, fill #5

## 2024-02-10 MED ORDER — LEVOCETIRIZINE DIHYDROCHLORIDE 5 MG PO TABS: 5.0000 mg | ORAL_TABLET | Freq: Every evening | ORAL | 1 refills | Status: DC

## 2024-02-10 MED ORDER — IPRATROPIUM-ALBUTEROL 0.5-2.5 (3) MG/3ML IN SOLN
3.0000 mL | Freq: Four times a day (QID) | RESPIRATORY_TRACT | 1 refills | Status: DC | PRN
  Filled 2024-02-10: qty 180, 15d supply, fill #0
  Filled 2024-03-03: qty 180, 15d supply, fill #1

## 2024-02-10 MED ORDER — PREDNISONE 20 MG PO TABS
ORAL_TABLET | ORAL | 0 refills | Status: DC
Start: 2024-02-10 — End: 2024-02-10

## 2024-02-10 MED ORDER — PREDNISONE 20 MG PO TABS
ORAL_TABLET | ORAL | 0 refills | Status: DC
  Filled 2024-02-10: qty 18, 9d supply, fill #0

## 2024-02-10 NOTE — Progress Notes (Signed)
 Name: Cory Rivas   MRN: [id number]    DOB: 10-28-1965   Date:02/10/2024       Progress Note  Chief Complaint  Patient presents with   Medical Management of Chronic Issues     Subjective:   Cory Rivas is a 58 y.o. male, presents to clinic for routine follow up on chronic conditions  Cardiology changes meds after seeing him outpt for HFU - added back spironolactone  and they asked we recheck BMP/potasssium BP at goal today 132/80, no LE edema, doing well  Endo managing DM Lab Results  Component Value Date   HGBA1C 7.9 (A) 11/28/2023   Asthma exacerbation and uncontrolled allergies - multiple episodes, nighttime sx, SOB, more drainage and itchy eyes Planted grass in yard and it grew a bunch of onions and he found out he was allergic to it     Current Outpatient Medications:    allopurinol  (ZYLOPRIM ) 300 MG tablet, Take 1 tablet (300 mg total) by mouth daily with 100 mg tablet for a total of 400mg  daily for gout., Disp: 90 tablet, Rfl: 1   amLODipine  (NORVASC ) 10 MG tablet, Take 1 tablet (10 mg total) by mouth daily., Disp: 30 tablet, Rfl: 3   aspirin  EC 81 MG tablet, Take 1 tablet (81 mg total) by mouth daily. Swallow whole., Disp: 90 tablet, Rfl: 3   baclofen  (LIORESAL ) 10 MG tablet, Take 1 tablet (10 mg total) by mouth 3 (three) times daily as needed for muscle spasms., Disp: 90 tablet, Rfl: 3   bisoprolol  (ZEBETA ) 5 MG tablet, Take 1 tablet (5 mg total) by mouth daily., Disp: 30 tablet, Rfl: 0   budesonide -formoterol  (SYMBICORT ) 160-4.5 MCG/ACT inhaler, Inhale 2 puffs into the lungs 2 (two) times daily., Disp: 1 each, Rfl: 12   Continuous Glucose Receiver (DEXCOM G7 RECEIVER) DEVI, Dispense one reciever to be used with 10 d devices for IDDM, Disp: 1 each, Rfl: 0   Continuous Glucose Receiver (FREESTYLE LIBRE 3 READER) DEVI, Use with libre 3 device as directed, Disp: 1 each, Rfl: 0   Continuous Glucose Sensor (FREESTYLE LIBRE 3 SENSOR) MISC, Place 1 sensor onto skin  every 14 (fourteen) days to monitor blood sugar continuously, Disp: 6 each, Rfl: 2   Dulaglutide  (TRULICITY ) 3 MG/0.5ML SOAJ, Inject 3 mg into the skin once a week., Disp: 2 mL, Rfl: 10   DULoxetine  (CYMBALTA ) 30 MG capsule, Take 2 capsules (60 mg total) by mouth every morning AND 1 capsule (30 mg total) every evening., Disp: 270 capsule, Rfl: 0   empagliflozin  (JARDIANCE ) 10 MG TABS tablet, Take 1 tablet (10 mg total) by mouth daily., Disp: 90 tablet, Rfl: 1   Evolocumab  (REPATHA ) 140 MG/ML SOSY, Inject 140 mg into the skin every 14 (fourteen) days., Disp: 2 mL, Rfl: 11   ezetimibe  (ZETIA ) 10 MG tablet, Take 1 tablet (10 mg total) by mouth daily., Disp: 90 tablet, Rfl: 0   Glucagon  (GVOKE HYPOPEN  1-PACK) 1 MG/0.2ML SOAJ, Inject 1 mg into the skin as needed., Disp: 1.8 mL, Rfl: 0   levocetirizine (XYZAL ) 5 MG tablet, TAKE ONE TABLET BY MOUTH EVERY EVENING, Disp: 90 tablet, Rfl: 1   lisinopril  (ZESTRIL ) 40 MG tablet, Take 1 tablet (40 mg total) by mouth daily., Disp: 90 tablet, Rfl: 1   pantoprazole  (PROTONIX ) 40 MG tablet, Take 1 tablet (40 mg total) by mouth daily., Disp: 90 tablet, Rfl: 1   pregabalin  (LYRICA ) 50 MG capsule, Take 1 capsule (50 mg total) by mouth 2 (two) times  daily., Disp: 60 capsule, Rfl: 0   rosuvastatin  (CRESTOR ) 40 MG tablet, Take 1 tablet (40 mg total) by mouth daily., Disp: 90 tablet, Rfl: 0   spironolactone  (ALDACTONE ) 25 MG tablet, Take 1 tablet (25 mg total) by mouth daily., Disp: 90 tablet, Rfl: 3   Tiotropium Bromide  Monohydrate (SPIRIVA  RESPIMAT) 2.5 MCG/ACT AERS, Inhale 2 puffs into the lungs daily., Disp: 4 g, Rfl: 11   torsemide  (DEMADEX ) 20 MG tablet, Take 20 mg by mouth 2 (two) times daily., Disp: , Rfl:    TRULICITY  3 MG/0.5ML SOPN, Inject 3 mg into the skin once a week. SATURDAYS, Disp: , Rfl:    VENTOLIN  HFA 108 (90 Base) MCG/ACT inhaler, INHALE TWO PUFFS INTO THE LUNGS EVERY SIX HOURS AS NEEDED FOR WHEEZING OR SHORTNESS OF BREATH, Disp: 18 each, Rfl: 2    fluticasone  (FLONASE ) 50 MCG/ACT nasal spray, Place 1 spray into both nostrils daily., Disp: 18.2 mL, Rfl: 6  Patient Active Problem List   Diagnosis Date Noted   Nicotine  dependence 01/23/2024   Acute pulmonary edema (HCC) 01/22/2024   CHF (congestive heart failure) (HCC) 01/20/2024   COPD exacerbation (HCC) 10/21/2023   History of colon polyps 09/25/2023   Myocardial injury 08/19/2023   Type II diabetes mellitus with renal manifestations (HCC) 08/19/2023   Obesity (BMI 30-39.9) 08/19/2023   HLD (hyperlipidemia) 08/19/2023   Chronic diastolic CHF (congestive heart failure) (HCC) 08/19/2023   Diarrhea 08/19/2023   CAD (coronary artery disease) 08/19/2023   CKD (chronic kidney disease) stage 2, GFR 60-89 ml/min 08/19/2023   Acute on chronic diastolic heart failure (HCC) 05/30/2023   Unstable angina (HCC) 05/29/2023   Pleuritic chest pain 05/27/2023   Type 2 diabetes mellitus with peripheral neuropathy (HCC) 05/27/2023   Gastroesophageal reflux disease without esophagitis 05/27/2023   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   Dyslipidemia 05/27/2023   Acute decompensated heart failure (HCC) 11/23/2022   Hypertensive emergency 11/22/2022   Acute respiratory failure with hypoxia (HCC) 11/22/2022   Elevated troponin 11/22/2022   Acute on chronic heart failure with preserved ejection fraction (HFpEF) (HCC) 08/24/2022   Acute on chronic diastolic congestive heart failure (HCC) 08/23/2022   Class 2 obesity 08/23/2022   Uncontrolled diabetes mellitus with hyperglycemia, with long-term current use of insulin  (HCC) 08/23/2022   Grade I diastolic dysfunction 06/08/2022   LVH (left ventricular hypertrophy) 06/08/2022   Accelerated hypertension 06/08/2022   S/P cervical spinal fusion 08/18/2021   Former heavy tobacco smoker 08/18/2021   Gout 08/18/2021   Other chronic pain 10/26/2020   Systolic murmur 10/26/2020   Tobacco use 10/26/2020   Polyp of ascending colon    Rectal polyp    Class 2  severe obesity with serious comorbidity and body mass index (BMI) of 39.0 to 39.9 in adult [facility]) 04/15/2019   Chronic kidney disease (CKD) stage G2/A3, mildly decreased glomerular filtration rate (GFR) between 60-89 mL/min/1.73 square meter and albuminuria creatinine ratio greater than 300 mg/g 04/15/2019   Proteinuria 04/15/2019   Hyperlipidemia associated with type 2 diabetes mellitus (HCC) 03/18/2019   Diabetes mellitus (HCC) 03/18/2019   Idiopathic chronic gout of multiple sites without tophus 03/18/2019   Essential hypertension 03/18/2019   Dermatitis 03/18/2019   Lumbar degenerative disc disease 11/22/2015   Cervical myelopathy (HCC) 11/22/2015   Osteoarthritis of spine with radiculopathy, lumbar region 11/22/2015   Degenerative disc disease, cervical 11/22/2015   Osteoarthritis of spine with radiculopathy, cervical region 11/22/2015    Past Surgical History:  Procedure Laterality Date   BACK  SURGERY     CARDIAC CATHETERIZATION     CHOLECYSTECTOMY     COLONOSCOPY WITH PROPOFOL  N/A 03/10/2020   Procedure: COLONOSCOPY WITH PROPOFOL ;  Surgeon: Marnee Sink, MD;  Location: [facility] ENDOSCOPY;  Service: Endoscopy;  Laterality: N/A;   COLONOSCOPY WITH PROPOFOL  N/A 09/25/2023   Procedure: COLONOSCOPY WITH PROPOFOL ;  Surgeon: Marnee Sink, MD;  Location: [facility] ENDOSCOPY;  Service: Endoscopy;  Laterality: N/A;   CORONARY PRESSURE/FFR STUDY N/A 05/29/2023   Procedure: CORONARY PRESSURE/FFR STUDY;  Surgeon: Sammy Crisp, MD;  Location: [facility] INVASIVE CV LAB;  Service: Cardiovascular;  Laterality: N/A;   POLYPECTOMY  09/25/2023   Procedure: POLYPECTOMY;  Surgeon: Marnee Sink, MD;  Location: [facility] ENDOSCOPY;  Service: Endoscopy;;   RIGHT/LEFT HEART CATH AND CORONARY ANGIOGRAPHY N/A 05/29/2023   Procedure: RIGHT/LEFT HEART CATH AND CORONARY ANGIOGRAPHY;  Surgeon: Sammy Crisp, MD;  Location: [facility] INVASIVE CV LAB;  Service: Cardiovascular;  Laterality: N/A;   RIGHT/LEFT HEART CATH AND CORONARY  ANGIOGRAPHY Bilateral 10/30/2023   Procedure: RIGHT/LEFT HEART CATH AND CORONARY ANGIOGRAPHY;  Surgeon: Sammy Crisp, MD;  Location: [facility] INVASIVE CV LAB;  Service: Cardiovascular;  Laterality: Bilateral;    Family History  Problem Relation Age of Onset   Heart disease Mother    Diabetes Mother    Hyperlipidemia Mother    Hypertension Mother    Heart attack Mother    Lung cancer Father    Hypertension Father    Stroke Father    AAA (abdominal aortic aneurysm) Maternal Grandmother    Heart attack Maternal Grandmother    Diabetes Maternal Grandfather     Social History   Tobacco Use   Smoking status: Some Days    Current packs/day: 0.00    Average packs/day: 1 pack/day for 37.0 years (37.0 ttl pk-yrs)    Types: Cigarettes    Start date: 10/19/1989    Last attempt to quit: 08/20/2022    Years since quitting: 1.4   Smokeless tobacco: Never   Tobacco comments:    30+ years hx smoking 2 ppd, stopped smoking briefly in April 2022 for a surgery and then restarted smoking about 1/2 ppd last 4-5 months         5-6cigs daily- 04/10/2023  Vaping Use   Vaping status: Never Used  Substance Use Topics   Alcohol use: No   Drug use: No     Allergies  Allergen Reactions   Oxycodone  Nausea And Vomiting   Entresto  [Sacubitril -Valsartan ] Nausea Only and Other (See Comments)    Lightheaded and dizzy    Onion Nausea And Vomiting   Nitroglycerin  Itching and Rash    Health Maintenance  Topic Date Due   COVID-19 Vaccine (4 - 2024-25 season) 06/16/2023   HEMOGLOBIN A1C  02/25/2024   OPHTHALMOLOGY EXAM  03/04/2024   Diabetic kidney evaluation - Urine ACR  03/20/2024   INFLUENZA VACCINE  05/15/2024   FOOT EXAM  08/27/2024   Lung Cancer Screening  12/28/2024   Diabetic kidney evaluation - eGFR measurement  01/26/2025   Colonoscopy  09/24/2028   DTaP/Tdap/Td (2 - Td or Tdap) 04/14/2029   Pneumococcal Vaccine 35-78 Years old  Completed   Hepatitis C Screening  Completed   HIV  Screening  Completed   Zoster Vaccines- Shingrix   Completed   HPV VACCINES  Aged Out   Meningococcal B Vaccine  Aged Out    Chart Review Today: I personally reviewed active problem list, medication list, allergies, family history, social history, health maintenance, notes from last encounter, lab results, imaging  with the patient/caregiver today.   Review of Systems  All other systems reviewed and are negative.    Objective:   Vitals:   02/10/24 1000  BP: 132/80  Pulse: 71  Resp: 16  SpO2: 97%  Weight: 223 lb (101.2 kg)  Height: 5' (1.524 m)    Body mass index is 43.55 kg/m.  Physical Exam Vitals and nursing note reviewed.  Constitutional:      General: He is not in acute distress.    Appearance: He is well-developed. He is obese. He is not ill-appearing or toxic-appearing.  HENT:     Head: Normocephalic and atraumatic.     Nose: Nose normal.  Eyes:     General:        Right eye: No discharge.        Left eye: No discharge.     Conjunctiva/sclera: Conjunctivae normal.  Neck:     Trachea: No tracheal deviation.  Cardiovascular:     Rate and Rhythm: Normal rate and regular rhythm.     Pulses: Normal pulses.     Heart sounds: Normal heart sounds.  Pulmonary:     Effort: Pulmonary effort is normal. No respiratory distress.     Breath sounds: Normal breath sounds. No stridor. No wheezing, rhonchi or rales.  Musculoskeletal:     Cervical back: Rigidity (baseline) present.     Right lower leg: No edema.     Left lower leg: No edema.  Skin:    General: Skin is warm and dry.     Findings: No rash.  Neurological:     Mental Status: He is alert.     Motor: No abnormal muscle tone.     Coordination: Coordination normal.  Psychiatric:        Mood and Affect: Mood normal.        Behavior: Behavior normal.        Results for orders placed or performed in visit on 01/27/24  Basic Metabolic Panel Without GFR   Collection Time: 01/27/24  2:38 PM  Result Value Ref  Range   Glucose, Bld 118 (H) 65 - 99 mg/dL   BUN 15 7 - 25 mg/dL   Creat 4.78 2.95 - 6.21 mg/dL   BUN/Creatinine Ratio SEE NOTE: 6 - 22 (calc)   Sodium 141 135 - 146 mmol/L   Potassium 3.8 3.5 - 5.3 mmol/L   Chloride 104 98 - 110 mmol/L   CO2 29 20 - 32 mmol/L   Calcium  9.2 8.6 - 10.3 mg/dL      Assessment & Plan:   Problem List Items Addressed This Visit     Uncontrolled diabetes mellitus with hyperglycemia, with long-term current use of insulin  (HCC)   Uncontrolled, per endo, reviewed recent labs, with steroids sugars expected to increase He has continued working on diet efforts      Chronic diastolic CHF (congestive heart failure) (HCC)   Pt currently appears euvolemic He is following with cardiology and going to HF clinic They added back spironolactone  and asked us  to recheck labs      Relevant Orders   Basic Metabolic Panel Without GFR (Completed)   Other Visit Diagnoses       Exacerbation of persistent asthma, unspecified asthma severity    -  Primary   steroids prescribed, follow up pulmonology     Environmental and seasonal allergies       allergies flared up, refills on allergy meds and eye drops, recommend that he ask pulmonology if they  can do allergy testing?     Acute atopic conjunctivitis of both eyes       cromolyn  drops if pataday  OTC does not help, encouraged him to have good compliance with his allergy meds daily     Encounter for medication monitoring       Relevant Orders   Basic Metabolic Panel Without GFR (Completed)         Return in about 3 months (around 05/11/2024) for Routine follow-up.   Adeline Hone, PA-C 02/10/24 10:16 AM

## 2024-02-10 NOTE — Progress Notes (Unsigned)
 Cardiology Office Note    Date:  02/11/2024   ID:  Cory Rivas, DOB [birth year], MRN [id number]  PCP:  Adeline Hone, PA-C  Cardiologist:  Constancia Delton, MD  Electrophysiologist:  None   Chief Complaint: Hospital follow-up  History of Present Illness:   Cory Rivas is a 58 y.o. male with history of nonobstructive CAD, HFpEF, CKD stage IIIa, IDDM, HTN, HLD, cervical myelopathy, idiopathic chronic gout, tobacco use for 35+years, asthma, obesity with OSA and tracheobronchomalacia who presents for hospital follow-up.  In the setting of preoperative cardiac risk stratification for murmur he underwent Lexiscan  MPI in 09/2020 which showed no significant ischemia or coronary artery calcification on CT imaging.  Echo in 10/2020 showed an EF of 60 to 65%, no regional wall motion abnormalities, grade 1 diastolic dysfunction, and no significant valvular abnormalities.  Repeat echo in 08/2022 showed an EF of 60 to 65%, no regional wall motion normalities, mild LVH, grade 2 diastolic dysfunction, normal RV systolic function and ventricular cavity size mild mitral regurgitation, and aortic valve sclerosis without evidence of stenosis.   He was admitted to the hospital in 05/2023 with chest pain and shortness of breath with diaphoresis after eating dinner.  He was markedly hypertensive upon arrival with a blood pressure of 210/103.  High-sensitivity troponin 26 trending to 24.  BNP 199.  Chest x-ray without acute cardiopulmonary process.  Echo on 05/28/2023 showed an EF of 50 to 55%, no regional wall motion abnormalities, moderate LVH, grade 3 diastolic dysfunction, elevated left atrial pressure, normal RV systolic function and ventricular cavity size, mildly dilated left atrium, mild mitral regurgitation, aortic valve sclerosis without evidence of stenosis, and an estimated right atrial pressure of 3 mmHg.  R/LHC on 05/29/2023 showed moderate, nonobstructive CAD with sequential 50 to 60% ostial and proximal  LAD stenoses that were not hemodynamically significant (RFR 0.93).  There was also 40% ostial stenosis of a large OM1 branch.  The nondominant RCA was without significant disease.  Upper normal to mildly elevated left and right heart filling pressures.  Mild pulmonary hypertension.  Borderline low cardiac output/index.  Mild aortic stenosis.  Gentle diuresis and medical therapy/risk factor modification were recommended.     He was seen in hospital follow-up on 06/21/2023 and was without symptoms of angina or cardiac decompensation.  Weight was down 7 pounds by our scale.  He was adherent and tolerating cardiac medications.  He continued to smoke approximately 1/2 pack of cigarettes per day, was only taking 1 or 2 drags then letting the cigarette burn out.  Carvedilol  was titrated to 12.5 mg twice daily with continuation of Jardiance , lisinopril , spironolactone , and torsemide .     He was admitted to the hospital in early 08/2023 with hypertensive urgency with blood pressure 195/95 with associated chest pressure and dyspnea with cough productive of yellow sputum.  He reported eating some chicken wings leading up to this hospitalization.  Reported adherence to medications.  High-sensitivity troponin 23 with a delta troponin of 22.  D-dimer negative.  BNP 143.  Chest x-ray without acute cardiopulmonary process.  EKG showed sinus rhythm with occasional PACs with known inferolateral T wave inversion.  He reported good urine output with IV Lasix .  With improvement in blood pressure, and chest pressure also improved.  Mildly elevated troponin felt to be reflective of supply/demand ischemia in the context of volume overload and hypertensive urgency.     He was seen in the ED in 08/2023 with chest pain similar to his  prior evaluations and admissions.  High-sensitivity troponin negative x 1.  D-dimer negative.  Chest x-ray notable for patchy left lower opacity representative of possible atelectasis or pneumonia.  He was  discharged with antibiotic therapy.   He was admitted to [facility] from 1/6-10/24/2023 with acute hypoxic respiratory failure secondary to COPD exacerbation and acute on chronic HFpEF with admission complicated by acute on CKD stage IIIa.  There was concern for dietary indiscretion, patient denies this.  High-sensitivity troponin 21 with a delta troponin of 20 subsequently trended to 19.  BNP 331.  D-dimer negative.  He was treated with supplemental oxygen, IV Lasix  in the ER followed by increase in home torsemide  to 40 mg twice daily, Solu-Medrol , DuoNebs, and Rocephin  and azithromycin .  At time of discharge, his lisinopril , spironolactone , and torsemide  were held due to AKI.  At time of discharge, patient continued to note some dyspnea and chest discomfort along with dizziness.   He was seen in our office on 10/28/2023 reporting ongoing chest discomfort and dyspnea that was unchanged when compared to his hospital presentation earlier in the month.  His weight was down 6 pounds by our scale when compared to his visit in 08/2023.  Given ongoing symptoms he underwent R/LHC on 10/30/2023 that showed moderate LAD and OM1 disease, not significantly different when compared to cath in 05/2023.  RFR of sequential ostial and proximal LAD stenosis at that time and was not hemodynamically significant.  Normal left and right heart filling pressures, mild pulmonary hypertension, and normal cardiac output and index.  Escalation of medical therapy was recommended.  Echo on 11/12/2023 showed an EF of 55 to 60%, no regional wall motion abnormalities, normal LV diastolic function parameters, normal RV systolic function and ventricular cavity size mild mitral regurgitation, and aortic valve sclerosis without evidence of stenosis.  He was seen in follow-up in 10/2023 indicating he felt much better and reported adherence to pharmacotherapy.  He continued to smoke approximately quarter pack per day.  Carvedilol  was titrated to 37.5 mg twice  daily for further management of hypertension.  Since he was last seen in the office he was seen in the ER in 01/02/2024 and 02/02/2024 with atypical chest discomfort with stable mildly elevated troponin in the teens to 20s.  CTA of the chest, abdomen, pelvis in 12/2023 is showing no acute aortic syndrome, with pulmonary edema with trace bilateral pleural effusions, coronary calcification, and aortic atherosclerosis.  He was admitted to the hospital in 01/2024 with acute hypoxic respiratory failure secondary to COPD exacerbation and acute on chronic HFpEF.  High-sensitivity troponin mildly elevated and flat trending peaking at 25, consistent with baseline readings.  BNP normal at 152.  He was treated for COPD and diuresed with symptomatic improvement.  Carvedilol  was transitioned to bisoprolol .  Spironolactone  was discontinued with underlying renal dysfunction.  He followed up with the [facility] CHF clinic on 01/29/2024 and was advised to restart spironolactone  25 mg daily with continuing bisoprolol  5 mg, Jardiance  10 mg, lisinopril  40 mg, and torsemide  20 mg twice daily.  He comes in for follow-up from a cardiac perspective and is without symptoms of angina or cardiac decompensation.  Reports his dyspnea is improved from his hospitalization.  No lower extremity swelling, abdominal distention, or progressive orthopnea.  Weight stable at home.  He continues to intermittently eat a diet high in sodium, having canned tuna with crackers this morning.  He has been out of Repatha .  Reports adherence to torsemide , spironolactone , Jardiance , bisoprolol , rosuvastatin , lisinopril , and ezetimibe .  Continues to smoke less than 1 pack daily.   Labs independently reviewed: 01/2024 - BUN 15, serum creatinine 1.22, potassium 3.8, BNP 152, Hgb 13.9, PLT 195, albumin 3.7, AST/ALT normal 11/2023 - A1c 7.9 10/2023 - direct LDL 106, TC 178, TG 209, HDL 41, magnesium  2.2 04/2023 - TSH normal   Past Medical History:  Diagnosis Date    Bulging of cervical intervertebral disc    CHF (congestive heart failure) (HCC)    COPD (chronic obstructive pulmonary disease) (HCC)    Diabetes mellitus without complication (HCC)    Gout    High cholesterol    Hypertension    Stress due to family tension 04/15/2019    Past Surgical History:  Procedure Laterality Date   BACK SURGERY     CARDIAC CATHETERIZATION     CHOLECYSTECTOMY     COLONOSCOPY WITH PROPOFOL  N/A 03/10/2020   Procedure: COLONOSCOPY WITH PROPOFOL ;  Surgeon: Marnee Sink, MD;  Location: [facility] ENDOSCOPY;  Service: Endoscopy;  Laterality: N/A;   COLONOSCOPY WITH PROPOFOL  N/A 09/25/2023   Procedure: COLONOSCOPY WITH PROPOFOL ;  Surgeon: Marnee Sink, MD;  Location: [facility] ENDOSCOPY;  Service: Endoscopy;  Laterality: N/A;   CORONARY PRESSURE/FFR STUDY N/A 05/29/2023   Procedure: CORONARY PRESSURE/FFR STUDY;  Surgeon: Sammy Crisp, MD;  Location: [facility] INVASIVE CV LAB;  Service: Cardiovascular;  Laterality: N/A;   POLYPECTOMY  09/25/2023   Procedure: POLYPECTOMY;  Surgeon: Marnee Sink, MD;  Location: [facility] ENDOSCOPY;  Service: Endoscopy;;   RIGHT/LEFT HEART CATH AND CORONARY ANGIOGRAPHY N/A 05/29/2023   Procedure: RIGHT/LEFT HEART CATH AND CORONARY ANGIOGRAPHY;  Surgeon: Sammy Crisp, MD;  Location: [facility] INVASIVE CV LAB;  Service: Cardiovascular;  Laterality: N/A;   RIGHT/LEFT HEART CATH AND CORONARY ANGIOGRAPHY Bilateral 10/30/2023   Procedure: RIGHT/LEFT HEART CATH AND CORONARY ANGIOGRAPHY;  Surgeon: Sammy Crisp, MD;  Location: [facility] INVASIVE CV LAB;  Service: Cardiovascular;  Laterality: Bilateral;    Current Medications: Current Meds  Medication Sig   allopurinol  (ZYLOPRIM ) 300 MG tablet Take 1 tablet (300 mg total) by mouth daily with 100 mg tablet for a total of 400mg  daily for gout.   amLODipine  (NORVASC ) 10 MG tablet Take 1 tablet (10 mg total) by mouth daily.   aspirin  EC 81 MG tablet Take 1 tablet (81 mg total) by mouth daily. Swallow whole.    baclofen  (LIORESAL ) 10 MG tablet Take 1 tablet (10 mg total) by mouth 3 (three) times daily as needed for muscle spasms.   budesonide -formoterol  (SYMBICORT ) 160-4.5 MCG/ACT inhaler Inhale 2 puffs into the lungs 2 (two) times daily.   cromolyn (OPTICROM) 4 % ophthalmic solution Place 1 drop into both eyes 4 (four) times daily as needed.   Dulaglutide  (TRULICITY ) 3 MG/0.5ML SOAJ Inject 3 mg into the skin once a week.   DULoxetine  (CYMBALTA ) 30 MG capsule Take 2 capsules (60 mg total) by mouth every morning AND 1 capsule (30 mg total) every evening.   empagliflozin  (JARDIANCE ) 10 MG TABS tablet Take 1 tablet (10 mg total) by mouth daily.   Evolocumab  (REPATHA ) 140 MG/ML SOSY Inject 140 mg into the skin every 14 (fourteen) days.   ezetimibe  (ZETIA ) 10 MG tablet Take 1 tablet (10 mg total) by mouth daily.   fluticasone  (FLONASE ) 50 MCG/ACT nasal spray Place 1 spray into both nostrils daily.   Glucagon  (GVOKE HYPOPEN  1-PACK) 1 MG/0.2ML SOAJ Inject 1 mg into the skin as needed.   ipratropium-albuterol  (DUONEB) 0.5-2.5 (3) MG/3ML SOLN Take 3 mLs by nebulization every 6 (six) hours as needed.  levocetirizine (XYZAL ) 5 MG tablet Take 1 tablet (5 mg total) by mouth every evening.   lisinopril  (ZESTRIL ) 40 MG tablet Take 1 tablet (40 mg total) by mouth daily.   pantoprazole  (PROTONIX ) 40 MG tablet Take 1 tablet (40 mg total) by mouth daily.   predniSONE  (DELTASONE ) 20 MG tablet Take 3 tablets (60 mg total) by mouth daily for 3 days, THEN 2 tablets (40 mg total) daily for 3 days, THEN 1 tablet (20 mg total) daily for 3 days.   pregabalin  (LYRICA ) 50 MG capsule Take 1 capsule (50 mg total) by mouth 2 (two) times daily.   rosuvastatin  (CRESTOR ) 40 MG tablet Take 1 tablet (40 mg total) by mouth daily.   spironolactone  (ALDACTONE ) 25 MG tablet Take 1 tablet (25 mg total) by mouth daily.   Tiotropium Bromide  Monohydrate (SPIRIVA  RESPIMAT) 2.5 MCG/ACT AERS Inhale 2 puffs into the lungs daily.   torsemide  (DEMADEX )  20 MG tablet Take 20 mg by mouth 2 (two) times daily.   TRULICITY  3 MG/0.5ML SOPN Inject 3 mg into the skin once a week. SATURDAYS   VENTOLIN  HFA 108 (90 Base) MCG/ACT inhaler INHALE TWO PUFFS INTO THE LUNGS EVERY SIX HOURS AS NEEDED FOR WHEEZING OR SHORTNESS OF BREATH   [DISCONTINUED] bisoprolol  (ZEBETA ) 5 MG tablet Take 1 tablet (5 mg total) by mouth daily.    Allergies:   Oxycodone , Entresto  [sacubitril -valsartan ], Onion, and Nitroglycerin    Social History   Socioeconomic History   Marital status: Divorced    Spouse name: Not on file   Number of children: 0   Years of education: 9   Highest education level: 9th grade  Occupational History   Occupation: disability  Tobacco Use   Smoking status: Some Days    Current packs/day: 0.00    Average packs/day: 1 pack/day for 37.0 years (37.0 ttl pk-yrs)    Types: Cigarettes    Start date: 10/19/1989    Last attempt to quit: 08/20/2022    Years since quitting: 1.4   Smokeless tobacco: Never   Tobacco comments:    30+ years hx smoking 2 ppd, stopped smoking briefly in April 2022 for a surgery and then restarted smoking about 1/2 ppd last 4-5 months         5-6cigs daily- 04/10/2023  Vaping Use   Vaping status: Never Used  Substance and Sexual Activity   Alcohol use: No   Drug use: No   Sexual activity: Not Currently    Partners: Female  Other Topics Concern   Not on file  Social History Narrative   Lives with sister Jenette Mitchell, brother in law Genella Kendall), and his mother - Kedarius Verba   Social Drivers of Health   Financial Resource Strain: High Risk (01/21/2024)   Overall Financial Resource Strain (CARDIA)    Difficulty of Paying Living Expenses: Hard  Food Insecurity: No Food Insecurity (01/21/2024)   Hunger Vital Sign    Worried About Running Out of Food in the Last Year: Never true    Ran Out of Food in the Last Year: Never true  Transportation Needs: No Transportation Needs (01/21/2024)   PRAPARE - Scientist, research (physical sciences) (Medical): No    Lack of Transportation (Non-Medical): No  Physical Activity: Insufficiently Active (12/06/2022)   Exercise Vital Sign    Days of Exercise per Week: 3 days    Minutes of Exercise per Session: 10 min  Stress: No Stress Concern Present (12/06/2022)   [employer] of Occupational Health -  Occupational Stress Questionnaire    Feeling of Stress : Only a little  Social Connections: Socially Isolated (12/06/2022)   Social Connection and Isolation Panel [NHANES]    Frequency of Communication with Friends and Family: More than three times a week    Frequency of Social Gatherings with Friends and Family: Twice a week    Attends Religious Services: Never    Database administrator or Organizations: No    Attends Engineer, structural: Never    Marital Status: Divorced     Family History:  The patient's family history includes AAA (abdominal aortic aneurysm) in his maternal grandmother; Diabetes in his maternal grandfather and mother; Heart attack in his maternal grandmother and mother; Heart disease in his mother; Hyperlipidemia in his mother; Hypertension in his father and mother; Lung cancer in his father; Stroke in his father.  ROS:   12-point review of systems is negative unless otherwise noted in the HPI.   EKGs/Labs/Other Studies Reviewed:    Studies reviewed were summarized above. The additional studies were reviewed today:  2D echo 11/12/2023: 1. Left ventricular ejection fraction, by estimation, is 55 to 60%. The  left ventricle has normal function. The left ventricle has no regional  wall motion abnormalities. Left ventricular diastolic parameters were  normal. The average left ventricular  global longitudinal strain is -13.3 %.   2. Right ventricular systolic function is normal. The right ventricular  size is normal. Tricuspid regurgitation signal is inadequate for assessing  PA pressure.   3. The mitral valve is normal in structure. Mild  mitral valve  regurgitation. No evidence of mitral stenosis.   4. The aortic valve is normal in structure. There is mild calcification  of the aortic valve. Aortic valve regurgitation is not visualized. Aortic  valve sclerosis is present, with no evidence of aortic valve stenosis.   5. The inferior vena cava is normal in size with greater than 50%  respiratory variability, suggesting right atrial pressure of 3 mmHg.  __________   [facility] 10/30/2023: Conclusions: Moderate LAD and OM1 disease, not significantly different compared to prior catheterization in 05/2023.  RFR of sequential ostial and proximal LAD stenosis at that time was not hemodynamically significant. Normal left and right heart filling pressures. Mild pulmonary hypertension. Normal Fick cardiac output/index.   Recommendations: Increase amlodipine  to 10 mg daily for BP control and antianginal therapy. Consider resumption of lisinopril  +/- spironolactone  at follow-up based on blood pressure and renal function. Continue aggressive secondary prevention of coronary artery disease. __________   [facility] 05/29/2023: Conclusions: Moderate, nonobstructive coronary artery disease with sequential 50-60% ostial and proximal LAD stenoses that are not hemodynamically significant (RFR = 0.93).  There is also a 40% ostial stenosis of a large first OM branch.  Nondominant RCA is without significant disease. Upper normal to mildly elevated left and right heart filling pressures. Mild pulmonary hypertension. Borderline low Fick cardiac output/index. Mild aortic stenosis.   Recommendations: Resume gentle diuresis tomorrow if renal function is stable. Medical therapy and risk factor modification to prevent progression of coronary artery disease. __________   2D echo 05/28/2023: 1. Left ventricular ejection fraction, by estimation, is 50 to 55%. The  left ventricle has low normal function. The left ventricle has no regional  wall motion  abnormalities. There is moderate left ventricular hypertrophy.  Left ventricular diastolic  parameters are consistent with Grade III diastolic dysfunction  (restrictive). Elevated left atrial pressure. The average left ventricular  global longitudinal strain is -10.0 %. The  global longitudinal strain is  abnormal.   2. Right ventricular systolic function is normal. The right ventricular  size is normal. Tricuspid regurgitation signal is inadequate for assessing  PA pressure.   3. Left atrial size was mildly dilated.   4. The mitral valve was not well visualized. Mild mitral valve  regurgitation.   5. The aortic valve has an indeterminant number of cusps. There is  moderate calcification of the aortic valve. There is moderate thickening  of the aortic valve. Aortic valve regurgitation is not visualized. Aortic  valve sclerosis/calcification is  present, without any evidence of aortic stenosis.   6. The inferior vena cava is normal in size with greater than 50%  respiratory variability, suggesting right atrial pressure of 3 mmHg.  __________   2D echo 08/23/2022: 1. Left ventricular ejection fraction, by estimation, is 60 to 65%. The  left ventricle has normal function. The left ventricle has no regional  wall motion abnormalities. There is mild left ventricular hypertrophy.  Left ventricular diastolic parameters  are consistent with Grade II diastolic dysfunction (pseudonormalization).   2. Right ventricular systolic function is normal. The right ventricular  size is normal.   3. The mitral valve is normal in structure. Mild mitral valve  regurgitation. No evidence of mitral stenosis.   4. The aortic valve is calcified. Aortic valve regurgitation is not  visualized. Aortic valve sclerosis/calcification is present, without any  evidence of aortic stenosis.   5. The inferior vena cava is normal in size with greater than 50%  respiratory variability, suggesting right atrial pressure of  3 mmHg.  __________   2D echo 11/04/2020: 1. Left ventricular ejection fraction, by estimation, is 60 to 65%. The  left ventricle has normal function. The left ventricle has no regional  wall motion abnormalities. Left ventricular diastolic parameters are  consistent with Grade I diastolic  dysfunction (impaired relaxation).There is mild left ventricular  hypertrophy of the basal segment. LVOT gradient of 10 mm Hg at rest, 22 mm  Hg gradient with valsalva   2. Right ventricular systolic function is normal. The right ventricular  size is normal. There is normal pulmonary artery systolic pressure. The  estimated right ventricular systolic pressure is 31.6 mmHg.   3. Left atrial size was mildly dilated.  ____________   Lexiscan  MPI 10/10/2020: Low risk, probably normal pharmacologic myocardial perfusion stress tst. There is a small in size, moderate in severity, fixed defect at the apex most likely representing artifact (apical thinning and attenuation) and less likely scar. There is no evidence of significant ischemia. The left ventricular ejection fraction is normal (59%). There is no significant coronary artery calcification on the attenuation correction CT.   EKG:  EKG is not ordered today.    Recent Labs: 10/24/2023: Magnesium  2.2 01/15/2024: ALT 16 01/20/2024: B Natriuretic Peptide 152.3; Hemoglobin 13.9; Platelets 195 02/10/2024: BUN 12; Creat 1.30; Potassium 4.3; Sodium 140  Recent Lipid Panel    Component Value Date/Time   CHOL 178 11/15/2023 0943   TRIG 209 (H) 11/15/2023 0943   HDL 41 11/15/2023 0943   CHOLHDL 4.3 11/15/2023 0943   CHOLHDL 6.3 (H) 08/09/2023 1156   VLDL 53 (H) 06/08/2022 1534   LDLCALC 101 (H) 11/15/2023 0943   LDLCALC  08/09/2023 1156     Comment:     . LDL cholesterol not calculated. Triglyceride levels greater than 400 mg/dL invalidate calculated LDL results. . Reference range: <100 . Desirable range <100 mg/dL for primary prevention;   <70 mg/dL  for patients with CHD or diabetic patients  with > or = 2 CHD risk factors. Aaron Aas LDL-C is now calculated using the Martin-Hopkins  calculation, which is a validated novel method providing  better accuracy than the Friedewald equation in the  estimation of LDL-C.  Melinda Sprawls et al. Erroll Heard. 7829;562(13): 2061-2068  (http://education.[employer].com/faq/FAQ164)    LDLDIRECT 106 (H) 11/15/2023 0865   LDLDIRECT 147 (H) 08/19/2023 2222    PHYSICAL EXAM:    VS:  BP (!) 158/82 (BP Location: Left Arm, Patient Position: Sitting, Cuff Size: Normal)   Pulse 80   Ht 5\' 4"  (1.626 m)   Wt 222 lb 6 oz (100.9 kg)   SpO2 97%   BMI 38.17 kg/m   BMI: Body mass index is 38.17 kg/m.  Physical Exam Vitals reviewed.  Constitutional:      Appearance: He is well-developed.  HENT:     Head: Normocephalic and atraumatic.  Eyes:     General:        Right eye: No discharge.        Left eye: No discharge.  Neck:     Vascular: No JVD.  Cardiovascular:     Rate and Rhythm: Normal rate and regular rhythm.     Heart sounds: S1 normal and S2 normal. Heart sounds not distant. No midsystolic click and no opening snap. Murmur heard.     Systolic murmur is present with a grade of 2/6 at the upper right sternal border.     No friction rub.  Pulmonary:     Effort: Pulmonary effort is normal. No respiratory distress.     Breath sounds: Normal breath sounds. No decreased breath sounds, wheezing, rhonchi or rales.  Chest:     Chest wall: No tenderness.  Abdominal:     General: There is no distension.     Palpations: Abdomen is soft.     Tenderness: There is no abdominal tenderness.  Musculoskeletal:     Cervical back: Normal range of motion.     Right lower leg: No edema.     Left lower leg: No edema.  Skin:    General: Skin is warm and dry.     Nails: There is no clubbing.  Neurological:     Mental Status: He is alert and oriented to person, place, and time.  Psychiatric:        Speech: Speech  normal.        Behavior: Behavior normal.        Thought Content: Thought content normal.        Judgment: Judgment normal.     Wt Readings from Last 3 Encounters:  02/11/24 222 lb 6 oz (100.9 kg)  02/10/24 223 lb (101.2 kg)  01/29/24 219 lb (99.3 kg)     ASSESSMENT & PLAN:   CAD involving the native coronary arteries with stable angina: He is overall doing well from a cardiac perspective and without symptoms of angina.  Recent LHC in 10/2023 showed stable coronary anatomy as outlined above with medical therapy recommended.  Continue aggressive risk factor modification and primary prevention including aspirin  81 mg, amlodipine  10 mg, ezetimibe  10 mg, rosuvastatin  40 mg, Repatha , and titrated dose of bisoprolol  at 10 mg.  No indication for further ischemic testing at this time.  HFpEF: Appears euvolemic and well compensated.  In the setting of elevated blood pressure readings titrate bisoprolol  to 10 mg daily with continuation of lisinopril  40 mg, spironolactone  25 mg, Jardiance  10 mg, and torsemide  20 mg  twice daily.  Defer transition from ACE inhibitor to ARNI given prior dizziness noted on Entresto .  Nonadherence to CPAP is likely contributing to symptoms.  Pulmonary hypertension: Mild by LHC earlier this year.  Suspected to be in the setting of obesity with obstructive sleep apnea not adherent to CPAP.  Follow-up with pulmonology and the CHF clinic.  HTN: Blood pressure is elevated in the office today.  Titrate bisoprolol  to 10 mg with continuation of amlodipine  10 mg, lisinopril  40 mg, and spironolactone  25 mg.  Labs obtained yesterday show stable renal function and electrolytes.  HLD: LDL 106 in 10/2023 with target LDL less than 70.  Normal AST/ALT earlier this month.  Has been out of Repatha .  He indicates he will pick up refills.  He will otherwise continue ezetimibe  10 mg and rosuvastatin  40 mg.    CKD stage IIIa: Renal function stable to improved on recent check on 4/28.  Obesity  with asthma, OSA, and tracheobronchomalacia: Contributing to overall presentation.  Weight loss is encouraged with a heart healthy diet and regular exercise.  Not adherent to CPAP leading machine to be returned.  Follow-up with pulmonology.  Tobacco use: Complete cessation is recommended.    Disposition: F/u with Dr. Junnie Olives or an APP in 3 months.   Medication Adjustments/Labs and Tests Ordered: Current medicines are reviewed at length with the patient today.  Concerns regarding medicines are outlined above. Medication changes, Labs and Tests ordered today are summarized above and listed in the Patient Instructions accessible in Encounters.   Signed, Varney Gentleman, PA-C 02/11/2024 10:41 AM     [facility] [place] [address] [place], [place] [address] [phone]

## 2024-02-10 NOTE — Telephone Encounter (Signed)
 Copied from CRM [id number]. Topic: Clinical - Medication Refill >> Feb 10, 2024 12:16 PM Turkey B wrote: Most Recent Primary Care Visit:  Provider: Adeline Hone  Department: [facility]  Visit Type: OFFICE VISIT  Date: 02/10/2024  Medication: ipratropium-albuterol  (DUONEB) 0.5-2.5 (3) MG/3ML/levocetirizine (XYZAL ) 5 MG tablet/predniSONE  (DELTASONE ) 20 MG tablet/  Has the patient contacted their pharmacy? yes (Agent: If yes, when and what did the pharmacy advise?)contact pcp/ meds were sent to the wrong pharmacy in [place]  Is this the correct pharmacy for this prescription? yes  This is the patient's preferred pharmacy:   [facility] [address] [place] [address] Phone: [phone] Fax: [phone]   Has the prescription been filled recently? Yes but went to wrong pharmacy  Is the patient out of the medication? yes  Has the patient been seen for an appointment in the last year OR does the patient have an upcoming appointment? yes  Can we respond through MyChart? yes  Agent: Please be advised that Rx refills may take up to 3 business days. We ask that you follow-up with your pharmacy.

## 2024-02-10 NOTE — Telephone Encounter (Signed)
 Copied from CRM [id number]. Topic: Clinical - Medication Refill >> Feb 10, 2024 12:16 PM Turkey B wrote: Most Recent Primary Care Visit:  Provider: Adeline Hone  Department: [facility]  Visit Type: OFFICE VISIT  Date: 02/10/2024  Medication: ipratropium-albuterol  (DUONEB) 0.5-2.5 (3) MG/3ML/levocetirizine (XYZAL ) 5 MG tablet/predniSONE  (DELTASONE ) 20 MG tablet/fluticasone  (FLONASE ) 50 MCG/ACT nasal spray   Has the patient contacted their pharmacy? yes (Agent: If yes, when and what did the pharmacy advise?)contact pcp/ meds were sent to the wrong pharmacy in [place]  Is this the correct pharmacy for this prescription? yes  This is the patient's preferred pharmacy:   [facility] [address] [place] [address] Phone: [phone] Fax: [phone]   Has the prescription been filled recently? Yes but went to wrong pharmacy  Is the patient out of the medication? yes  Has the patient been seen for an appointment in the last year OR does the patient have an upcoming appointment? yes  Can we respond through MyChart? yes  Agent: Please be advised that Rx refills may take up to 3 business days. We ask that you follow-up with your pharmacy.

## 2024-02-11 ENCOUNTER — Telehealth: Payer: Self-pay | Admitting: Pharmacy Technician

## 2024-02-11 ENCOUNTER — Other Ambulatory Visit (HOSPITAL_COMMUNITY): Payer: Self-pay

## 2024-02-11 ENCOUNTER — Encounter: Payer: Self-pay | Admitting: Family Medicine

## 2024-02-11 ENCOUNTER — Other Ambulatory Visit: Payer: Self-pay

## 2024-02-11 ENCOUNTER — Ambulatory Visit: Payer: Medicaid Other | Attending: Physician Assistant | Admitting: Physician Assistant

## 2024-02-11 ENCOUNTER — Encounter: Payer: Self-pay | Admitting: Physician Assistant

## 2024-02-11 VITALS — BP 158/82 | HR 80 | Ht 64.0 in | Wt 222.4 lb

## 2024-02-11 DIAGNOSIS — E785 Hyperlipidemia, unspecified: Secondary | ICD-10-CM | POA: Insufficient documentation

## 2024-02-11 DIAGNOSIS — I10 Essential (primary) hypertension: Secondary | ICD-10-CM | POA: Insufficient documentation

## 2024-02-11 DIAGNOSIS — F17200 Nicotine dependence, unspecified, uncomplicated: Secondary | ICD-10-CM | POA: Diagnosis not present

## 2024-02-11 DIAGNOSIS — N1831 Chronic kidney disease, stage 3a: Secondary | ICD-10-CM | POA: Insufficient documentation

## 2024-02-11 DIAGNOSIS — I5032 Chronic diastolic (congestive) heart failure: Secondary | ICD-10-CM | POA: Insufficient documentation

## 2024-02-11 DIAGNOSIS — Z6835 Body mass index (BMI) 35.0-35.9, adult: Secondary | ICD-10-CM | POA: Diagnosis not present

## 2024-02-11 DIAGNOSIS — I25118 Atherosclerotic heart disease of native coronary artery with other forms of angina pectoris: Secondary | ICD-10-CM | POA: Insufficient documentation

## 2024-02-11 DIAGNOSIS — I272 Pulmonary hypertension, unspecified: Secondary | ICD-10-CM | POA: Diagnosis not present

## 2024-02-11 DIAGNOSIS — E66812 Obesity, class 2: Secondary | ICD-10-CM | POA: Insufficient documentation

## 2024-02-11 DIAGNOSIS — G4733 Obstructive sleep apnea (adult) (pediatric): Secondary | ICD-10-CM | POA: Insufficient documentation

## 2024-02-11 LAB — BASIC METABOLIC PANEL WITHOUT GFR
BUN: 12 mg/dL (ref 7–25)
CO2: 31 mmol/L (ref 20–32)
Calcium: 9.1 mg/dL (ref 8.6–10.3)
Chloride: 101 mmol/L (ref 98–110)
Creat: 1.3 mg/dL (ref 0.70–1.30)
Glucose, Bld: 113 mg/dL — ABNORMAL HIGH (ref 65–99)
Potassium: 4.3 mmol/L (ref 3.5–5.3)
Sodium: 140 mmol/L (ref 135–146)

## 2024-02-11 MED ORDER — BISOPROLOL FUMARATE 10 MG PO TABS
10.0000 mg | ORAL_TABLET | Freq: Every day | ORAL | 3 refills | Status: DC
  Filled 2024-02-11: qty 30, 30d supply, fill #0
  Filled 2024-02-11 – 2024-02-18 (×2): qty 90, 90d supply, fill #0
  Filled 2024-02-18 – 2024-02-19 (×2): qty 30, 30d supply, fill #0
  Filled 2024-03-04 – 2024-03-17 (×4): qty 30, 30d supply, fill #1

## 2024-02-11 NOTE — Telephone Encounter (Signed)
 Pharmacy Patient Advocate Encounter  Received notification from [facility] that Prior Authorization for bisoprolol  10mg  has been APPROVED from 02/11/24 to 02/10/25. Spoke to pharmacy to process.Copay is $31.91.    PA #/Case ID/Reference #: [id number]

## 2024-02-11 NOTE — Telephone Encounter (Signed)
 Pharmacy Patient Advocate Encounter   Received notification from Onbase that prior authorization for BISOPROLOL  is required/requested.   Insurance verification completed.   The patient is insured through [facility] .   Per test claim: PA required; PA submitted to above mentioned insurance via CoverMyMeds Key/confirmation #/EOC [id number] Status is pending   I tried 10mg  and 5mg  both needed a pa

## 2024-02-11 NOTE — Patient Instructions (Signed)
 Medication Instructions:  Your physician recommends the following medication changes.  INCREASE: Bisoprolol  to 10 mg daily  *If you need a refill on your cardiac medications before your next appointment, please call your pharmacy*  Lab Work: No labs ordered today  If you have labs (blood work) drawn today and your tests are completely normal, you will receive your results only by: MyChart Message (if you have MyChart) OR A paper copy in the mail If you have any lab test that is abnormal or we need to change your treatment, we will call you to review the results.  Testing/Procedures: No test ordered today   Follow-Up: At [facility], you and your health needs are our priority.  As part of our continuing mission to provide you with exceptional heart care, our providers are all part of one team.  This team includes your primary Cardiologist (physician) and Advanced Practice Providers or APPs (Physician Assistants and Nurse Practitioners) who all work together to provide you with the care you need, when you need it.  Your next appointment:   3 month(s)  Provider:   Constancia Delton, MD or Varney Gentleman, PA-C

## 2024-02-12 ENCOUNTER — Other Ambulatory Visit: Payer: Self-pay

## 2024-02-12 ENCOUNTER — Other Ambulatory Visit (HOSPITAL_COMMUNITY): Payer: Self-pay

## 2024-02-12 MED ORDER — PANTOPRAZOLE SODIUM 40 MG PO TBEC
40.0000 mg | DELAYED_RELEASE_TABLET | Freq: Every day | ORAL | 1 refills | Status: DC
  Filled 2024-02-12: qty 90, 90d supply, fill #0
  Filled 2024-02-18: qty 30, 30d supply, fill #0

## 2024-02-12 NOTE — Telephone Encounter (Signed)
 Requested medication (s) are due for refill today: no  Requested medication (s) are on the active medication list: yes  Last refill:  02/10/24  Future visit scheduled: yes  Notes to clinic:  NT not delegated to refuse or reorder this med   Requested Prescriptions  Pending Prescriptions Disp Refills   predniSONE  (DELTASONE ) 20 MG tablet 18 tablet     Sig: 3 tabs poqday 1-3, 2 tabs poqday 4-6, 1 tab poqday 7-9     Not Delegated - Endocrinology:  Oral Corticosteroids Failed - 02/12/2024  2:02 PM      Failed - This refill cannot be delegated      Failed - Manual Review: Eye exam for IOP if prolonged treatment      Failed - Glucose (serum) in normal range and within 180 days    Glucose  Date Value Ref Range Status  12/18/2017 89  Final   Glucose, Bld  Date Value Ref Range Status  02/10/2024 113 (H) 65 - 99 mg/dL Final    Comment:    .            Fasting reference interval . For someone without known diabetes, a glucose value between 100 and 125 mg/dL is consistent with prediabetes and should be confirmed with a follow-up test. .    Glucose-Capillary  Date Value Ref Range Status  01/23/2024 191 (H) 70 - 99 mg/dL Final    Comment:    Glucose reference range applies only to samples taken after fasting for at least 8 hours.         Failed - Last BP in normal range    BP Readings from Last 1 Encounters:  02/11/24 (!) 158/82         Failed - Bone Mineral Density or Dexa Scan completed in the last 2 years      Passed - K in normal range and within 180 days    Potassium  Date Value Ref Range Status  02/10/2024 4.3 3.5 - 5.3 mmol/L Final         Passed - Na in normal range and within 180 days    Sodium  Date Value Ref Range Status  02/10/2024 140 135 - 146 mmol/L Final  11/15/2023 142 134 - 144 mmol/L Final         Passed - Valid encounter within last 6 months    Recent Outpatient Visits           2 days ago Exacerbation of persistent asthma, unspecified asthma  severity   [facility] Adeline Hone, PA-C   2 weeks ago Encounter for examination following treatment at hospital   [facility], PA-C   1 month ago Acute mid back pain   [facility] Donny Gall F, FNP   2 months ago Uncontrolled diabetes mellitus with hyperglycemia, with long-term current use of insulin  [facility])   [place] [facility] Adeline Hone, PA-C       Future Appointments             In 1 week Jorge Newcomer, MD [facility] Endocrinology   In 1 week Adeline Hone, PA-C [facility]   In 3 months [place], Elvia Hammans, PA-C [facility] [employer] [facility] Prescriptions Disp Refills   fluticasone  (FLONASE ) 50 MCG/ACT nasal spray 18.2 mL  Sig: Place 1 spray into both nostrils daily.     Ear, Nose, and Throat: Nasal Preparations - Corticosteroids Passed - 02/12/2024  2:02 PM      Passed - Valid encounter within last 12 months    Recent Outpatient Visits           2 days ago Exacerbation of persistent asthma, unspecified asthma severity   [place] Adeline Hone, PA-C   2 weeks ago Encounter for examination following treatment at hospital   [facility] Adeline Hone, PA-C   1 month ago Acute mid back pain   [facility] Donny Gall F, FNP   2 months ago Uncontrolled diabetes mellitus with hyperglycemia, with long-term current use of insulin  [facility])   [place] [facility] Adeline Hone, PA-C       Future Appointments             In 1 week Jorge Newcomer, MD [facility] Endocrinology   In 1 week Adeline Hone, PA-C [facility]   In 3 months [place], Elvia Hammans, PA-C [facility]             levocetirizine (XYZAL ) 5 MG tablet 90 tablet     Sig: Take  1 tablet (5 mg total) by mouth every evening.     Ear, Nose, and Throat:  Antihistamines - levocetirizine dihydrochloride  Passed - 02/12/2024  2:02 PM      Passed - Cr in normal range and within 360 days    Creat  Date Value Ref Range Status  02/10/2024 1.30 0.70 - 1.30 mg/dL Final   Creatinine, Urine  Date Value Ref Range Status  03/21/2023 8 (L) 20 - 320 mg/dL Final         Passed - eGFR is 10 or above and within 360 days    GFR, Est African American  Date Value Ref Range Status  09/30/2020 83 > OR = 60 mL/min/1.52m2 Final   GFR, Est Non African American  Date Value Ref Range Status  09/30/2020 71 > OR = 60 mL/min/1.21m2 Final   GFR, Estimated  Date Value Ref Range Status  01/23/2024 44 (L) >60 mL/min Final    Comment:    (NOTE) Calculated using the CKD-EPI Creatinine Equation (2021)    eGFR  Date Value Ref Range Status  11/15/2023 72 >59 mL/min/1.73 Final         Passed - Valid encounter within last 12 months    Recent Outpatient Visits           2 days ago Exacerbation of persistent asthma, unspecified asthma severity   [facility] Adeline Hone, PA-C   2 weeks ago Encounter for examination following treatment at hospital   [facility] Adeline Hone, PA-C   1 month ago Acute mid back pain   [facility] Donny Gall F, FNP   2 months ago Uncontrolled diabetes mellitus with hyperglycemia, with long-term current use of insulin  [facility])   [facility] Adeline Hone, PA-C       Future Appointments             In 1 week Jorge Newcomer, MD [facility] Endocrinology   In 1 week Adeline Hone, PA-C [facility], [facility]   In 3 months Dunn, Elvia Hammans, PA-C [facility]

## 2024-02-12 NOTE — Telephone Encounter (Signed)
 Requested Prescriptions  Pending Prescriptions Disp Refills   predniSONE  (DELTASONE ) 20 MG tablet 18 tablet     Sig: 3 tabs poqday 1-3, 2 tabs poqday 4-6, 1 tab poqday 7-9     Not Delegated - Endocrinology:  Oral Corticosteroids Failed - 02/12/2024  2:01 PM      Failed - This refill cannot be delegated      Failed - Manual Review: Eye exam for IOP if prolonged treatment      Failed - Glucose (serum) in normal range and within 180 days    Glucose  Date Value Ref Range Status  12/18/2017 89  Final   Glucose, Bld  Date Value Ref Range Status  02/10/2024 113 (H) 65 - 99 mg/dL Final    Comment:    .            Fasting reference interval . For someone without known diabetes, a glucose value between 100 and 125 mg/dL is consistent with prediabetes and should be confirmed with a follow-up test. .    Glucose-Capillary  Date Value Ref Range Status  01/23/2024 191 (H) 70 - 99 mg/dL Final    Comment:    Glucose reference range applies only to samples taken after fasting for at least 8 hours.         Failed - Last BP in normal range    BP Readings from Last 1 Encounters:  02/11/24 (!) 158/82         Failed - Bone Mineral Density or Dexa Scan completed in the last 2 years      Passed - K in normal range and within 180 days    Potassium  Date Value Ref Range Status  02/10/2024 4.3 3.5 - 5.3 mmol/L Final         Passed - Na in normal range and within 180 days    Sodium  Date Value Ref Range Status  02/10/2024 140 135 - 146 mmol/L Final  11/15/2023 142 134 - 144 mmol/L Final         Passed - Valid encounter within last 6 months    Recent Outpatient Visits           2 days ago Exacerbation of persistent asthma, unspecified asthma severity   [facility] Adeline Hone, PA-C   2 weeks ago Encounter for examination following treatment at hospital   [facility] Adeline Hone, PA-C   1 month ago Acute mid back pain   [facility] F, FNP   2 months ago Uncontrolled diabetes mellitus with hyperglycemia, with long-term current use of insulin  [facility])   [facility] Adeline Hone, PA-C       Future Appointments             In 1 week Jorge Newcomer, MD [facility] Endocrinology   In 1 week Adeline Hone, PA-C [facility]   In 3 months Dunn, Elvia Hammans, PA-C  [facility] [employer] [facility] Prescriptions Disp Refills   fluticasone  (FLONASE ) 50 MCG/ACT nasal spray 18.2 mL     Sig: Place 1 spray into both nostrils daily.     Ear, Nose, and Throat: Nasal Preparations - Corticosteroids Passed - 02/12/2024  2:01 PM      Passed - Valid encounter within last 12 months    Recent Outpatient Visits  2 days ago Exacerbation of persistent asthma, unspecified asthma severity   [place] [facility] Adeline Hone, PA-C   2 weeks ago Encounter for examination following treatment at hospital   [facility] Adeline Hone, PA-C   1 month ago Acute mid back pain   [facility] Donny Gall F, FNP   2 months ago Uncontrolled diabetes mellitus with hyperglycemia, with long-term current use of insulin  [facility])   [facility] Adeline Hone, PA-C       Future Appointments             In 1 week Jorge Newcomer, MD [facility] Endocrinology   In 1 week Adeline Hone, PA-C [facility], [facility]   In 3 months [place], Elvia Hammans, PA-C [facility]             levocetirizine (XYZAL ) 5 MG tablet 90 tablet     Sig: Take 1 tablet (5 mg total) by mouth every evening.     Ear, Nose, and Throat:  Antihistamines - levocetirizine dihydrochloride  Passed - 02/12/2024  2:01 PM      Passed - Cr in normal range and within 360 days    Creat  Date Value Ref Range Status   02/10/2024 1.30 0.70 - 1.30 mg/dL Final   Creatinine, Urine  Date Value Ref Range Status  03/21/2023 8 (L) 20 - 320 mg/dL Final         Passed - eGFR is 10 or above and within 360 days    GFR, Est African American  Date Value Ref Range Status  09/30/2020 83 > OR = 60 mL/min/1.36m2 Final   GFR, Est Non African American  Date Value Ref Range Status  09/30/2020 71 > OR = 60 mL/min/1.59m2 Final   GFR, Estimated  Date Value Ref Range Status  01/23/2024 44 (L) >60 mL/min Final    Comment:    (NOTE) Calculated using the CKD-EPI Creatinine Equation (2021)    eGFR  Date Value Ref Range Status  11/15/2023 72 >59 mL/min/1.73 Final         Passed - Valid encounter within last 12 months    Recent Outpatient Visits           2 days ago Exacerbation of persistent asthma, unspecified asthma severity   [facility] Adeline Hone, PA-C   2 weeks ago Encounter for examination following treatment at hospital   [facility] Adeline Hone, PA-C   1 month ago Acute mid back pain   [facility] Don[facility] F, FNP   2 months ago Uncontrolled diabetes mellitus with hyperglycemia, with long-term current use of insulin  [facility])   [facility] Adeline Hone, PA-C       Future Appointments             In 1 week Jorge Newcomer, MD [facility] Endocrinology   In 1 week Adeline Hone, PA-C [facility]   In 3 months Dunn, Elvia Hammans, PA-C  [facility]

## 2024-02-13 IMAGING — CR DG CHEST 2V
1 series · 2 of 2 positions shown · non-contrast
Comparison: August 01, 2019

CLINICAL DATA: Body aches.  Productive cough.

EXAM:
CHEST - 2 VIEW

[Series 1: dg chest 2 view · 0.14mm/px · 2 of 2 slices shown]
[im 1/2]
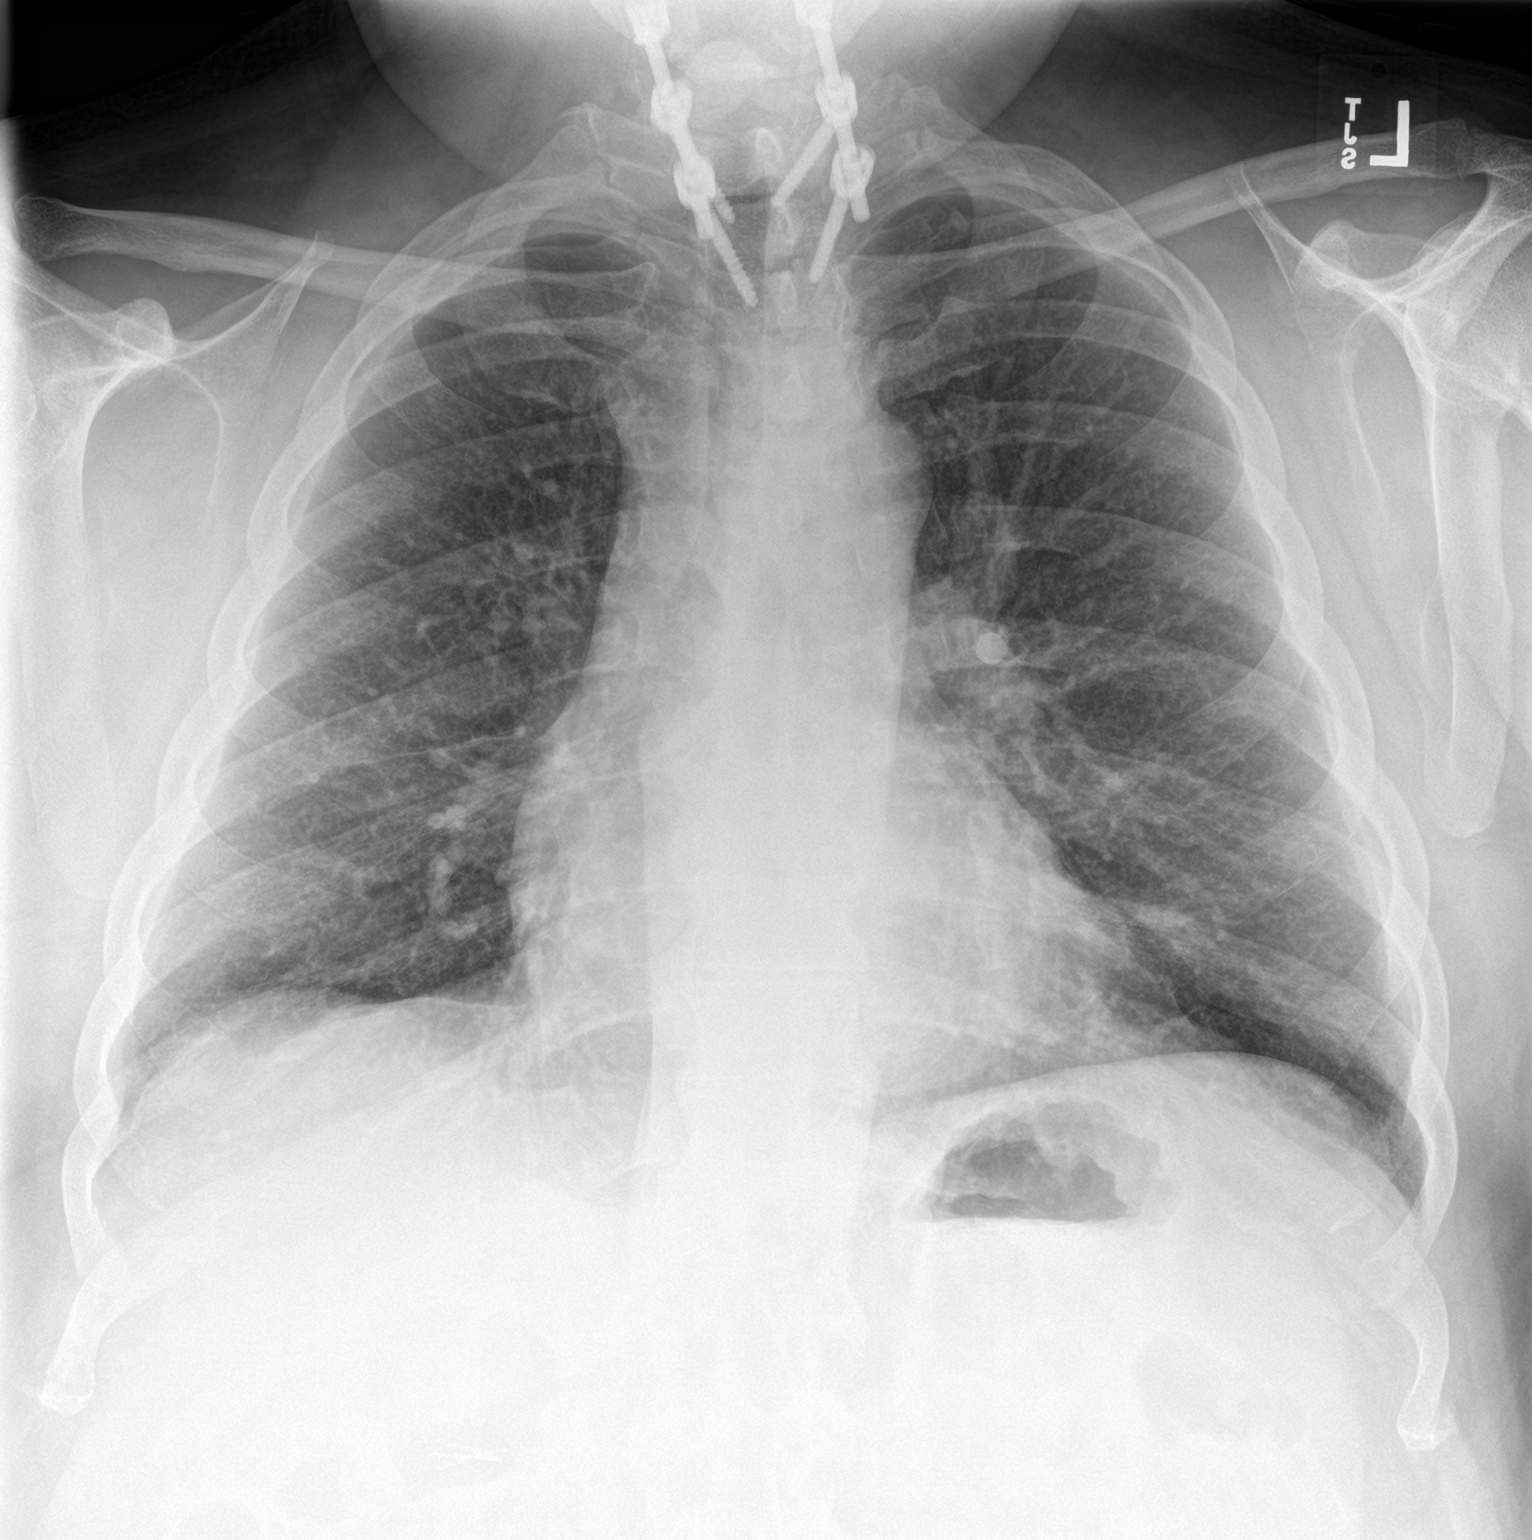
[im 2/2]
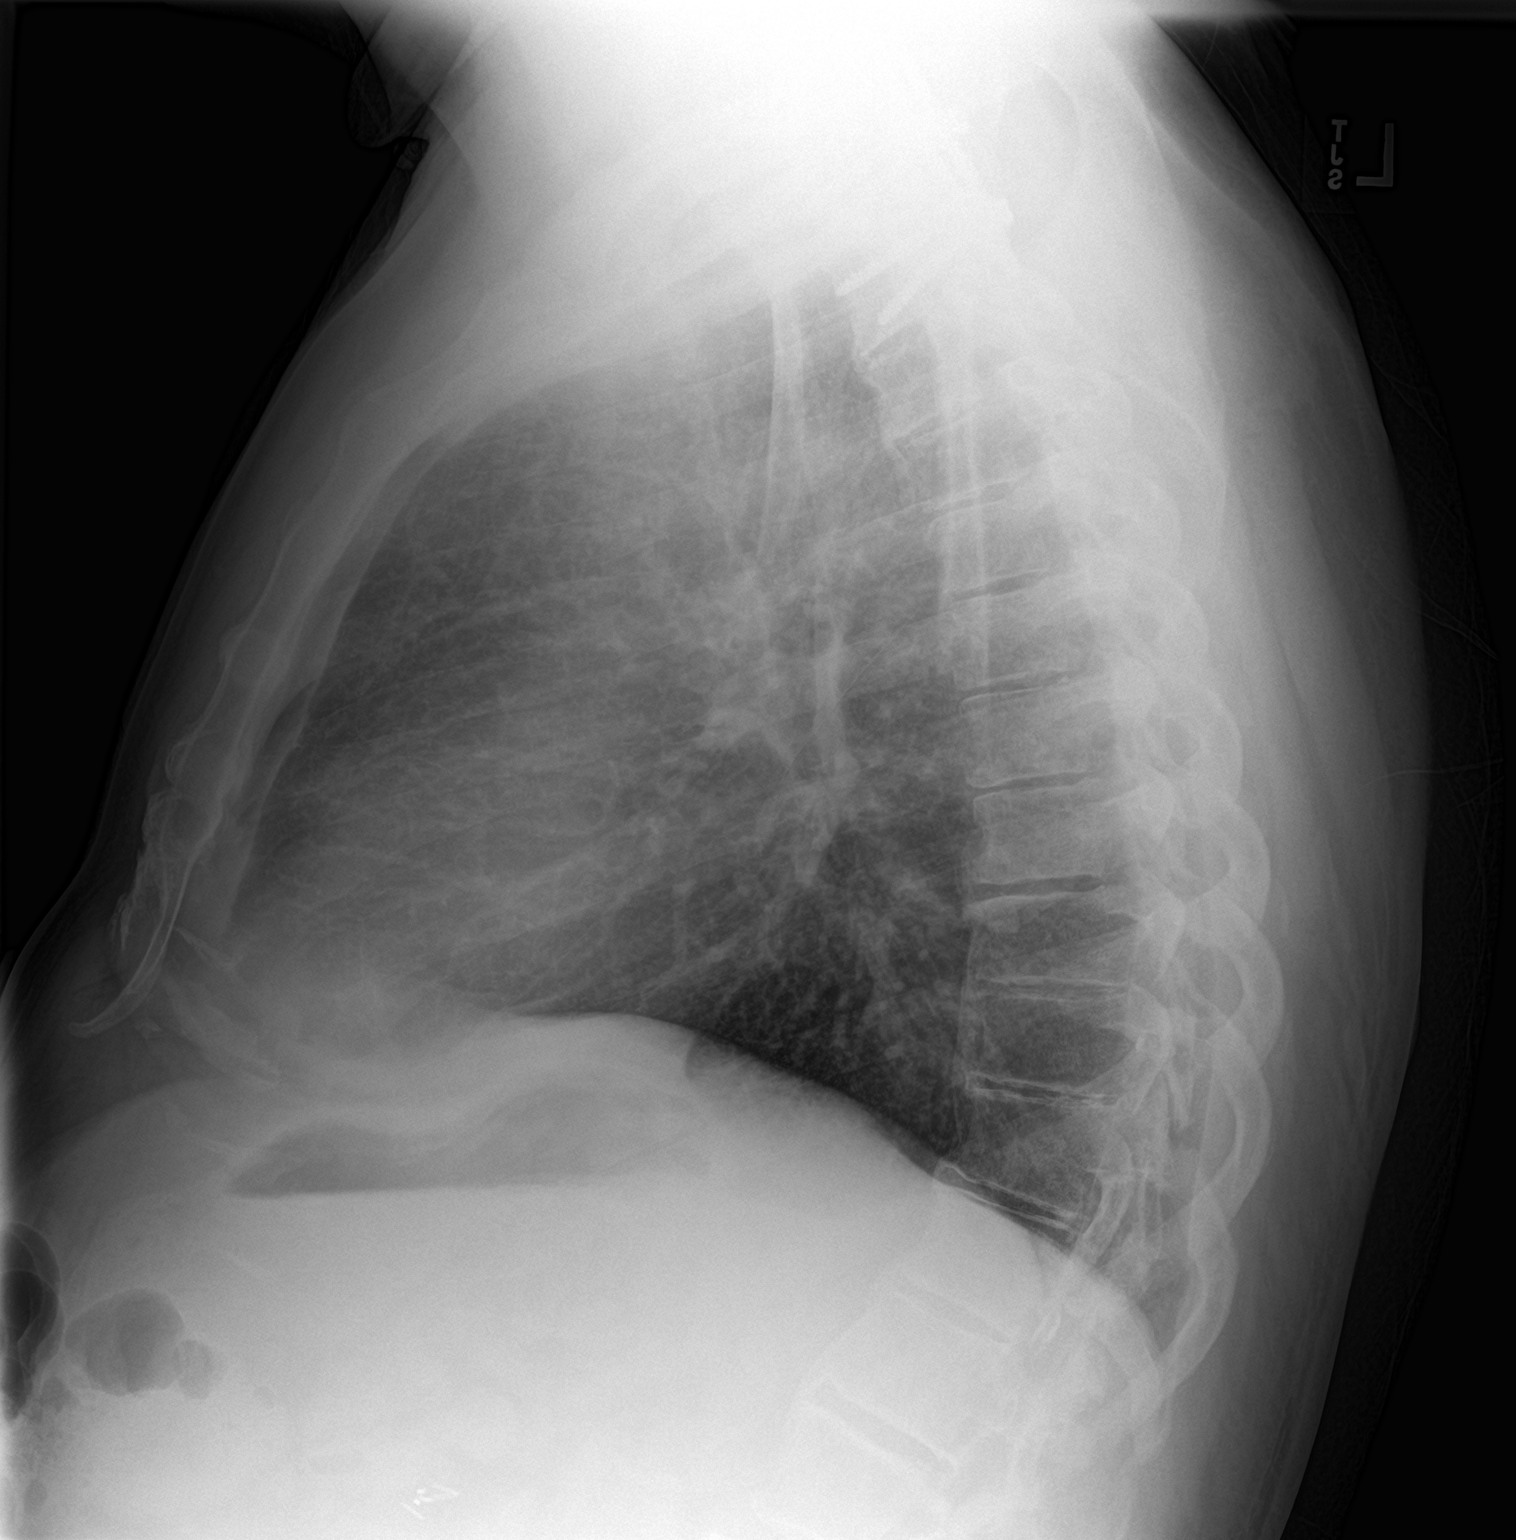

[2 of 2 positions shown; findings below may reference images not displayed]

FINDINGS: The heart size and mediastinal contours are within normal limits.
Both lungs are clear. The visualized skeletal structures are
unremarkable.
IMPRESSION: No active cardiopulmonary disease.

## 2024-02-14 ENCOUNTER — Other Ambulatory Visit: Payer: Self-pay

## 2024-02-16 IMAGING — CR DG CHEST 2V
1 series · 2 of 2 positions shown · non-contrast
Comparison: Chest x-ray 03/26/2022.

CLINICAL DATA: 56-year-old male with history of shortness of
breath.

EXAM:
CHEST - 2 VIEW

[Series 1: dg chest 2 view · 0.14mm/px · 2 of 2 slices shown]
[im 1/2]
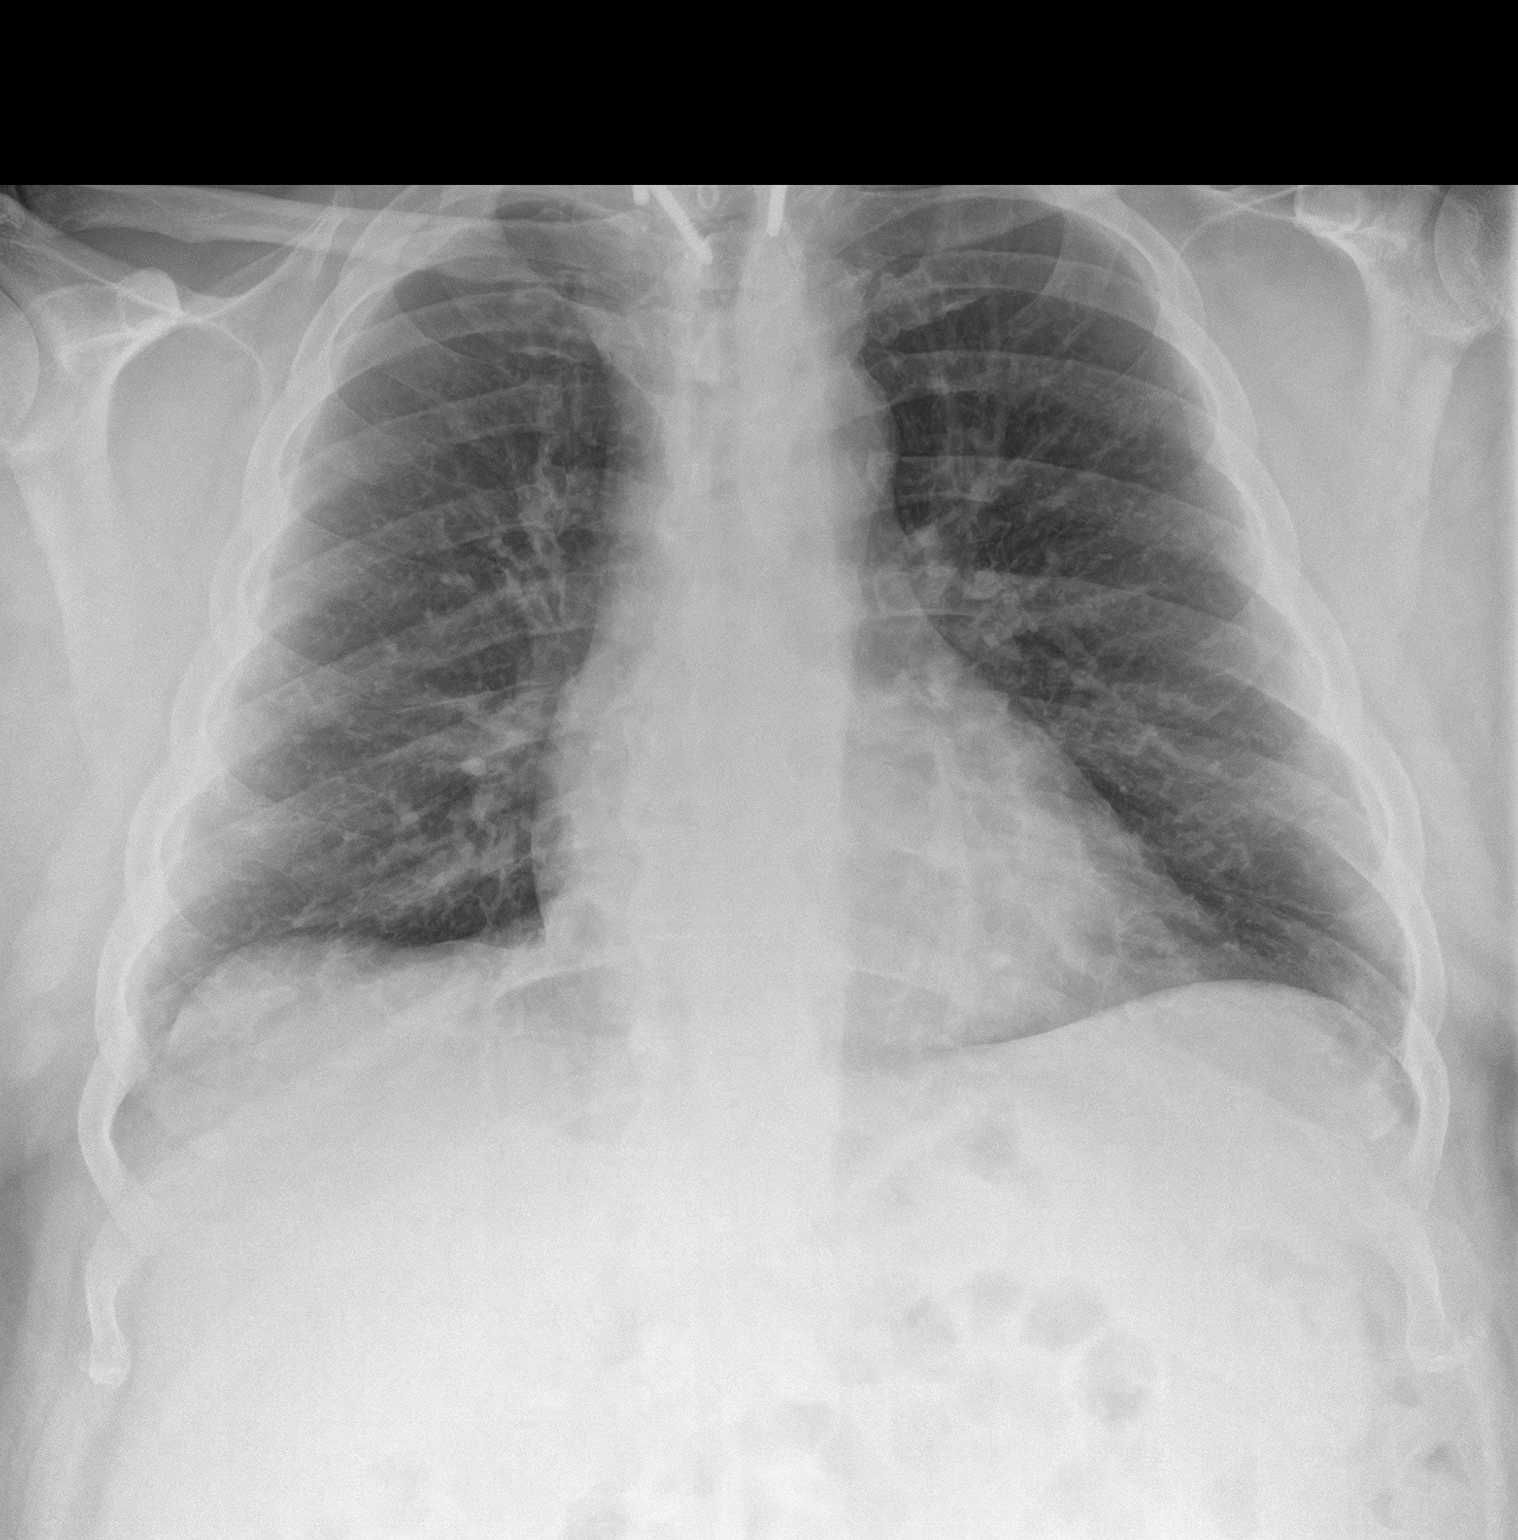
[im 2/2]
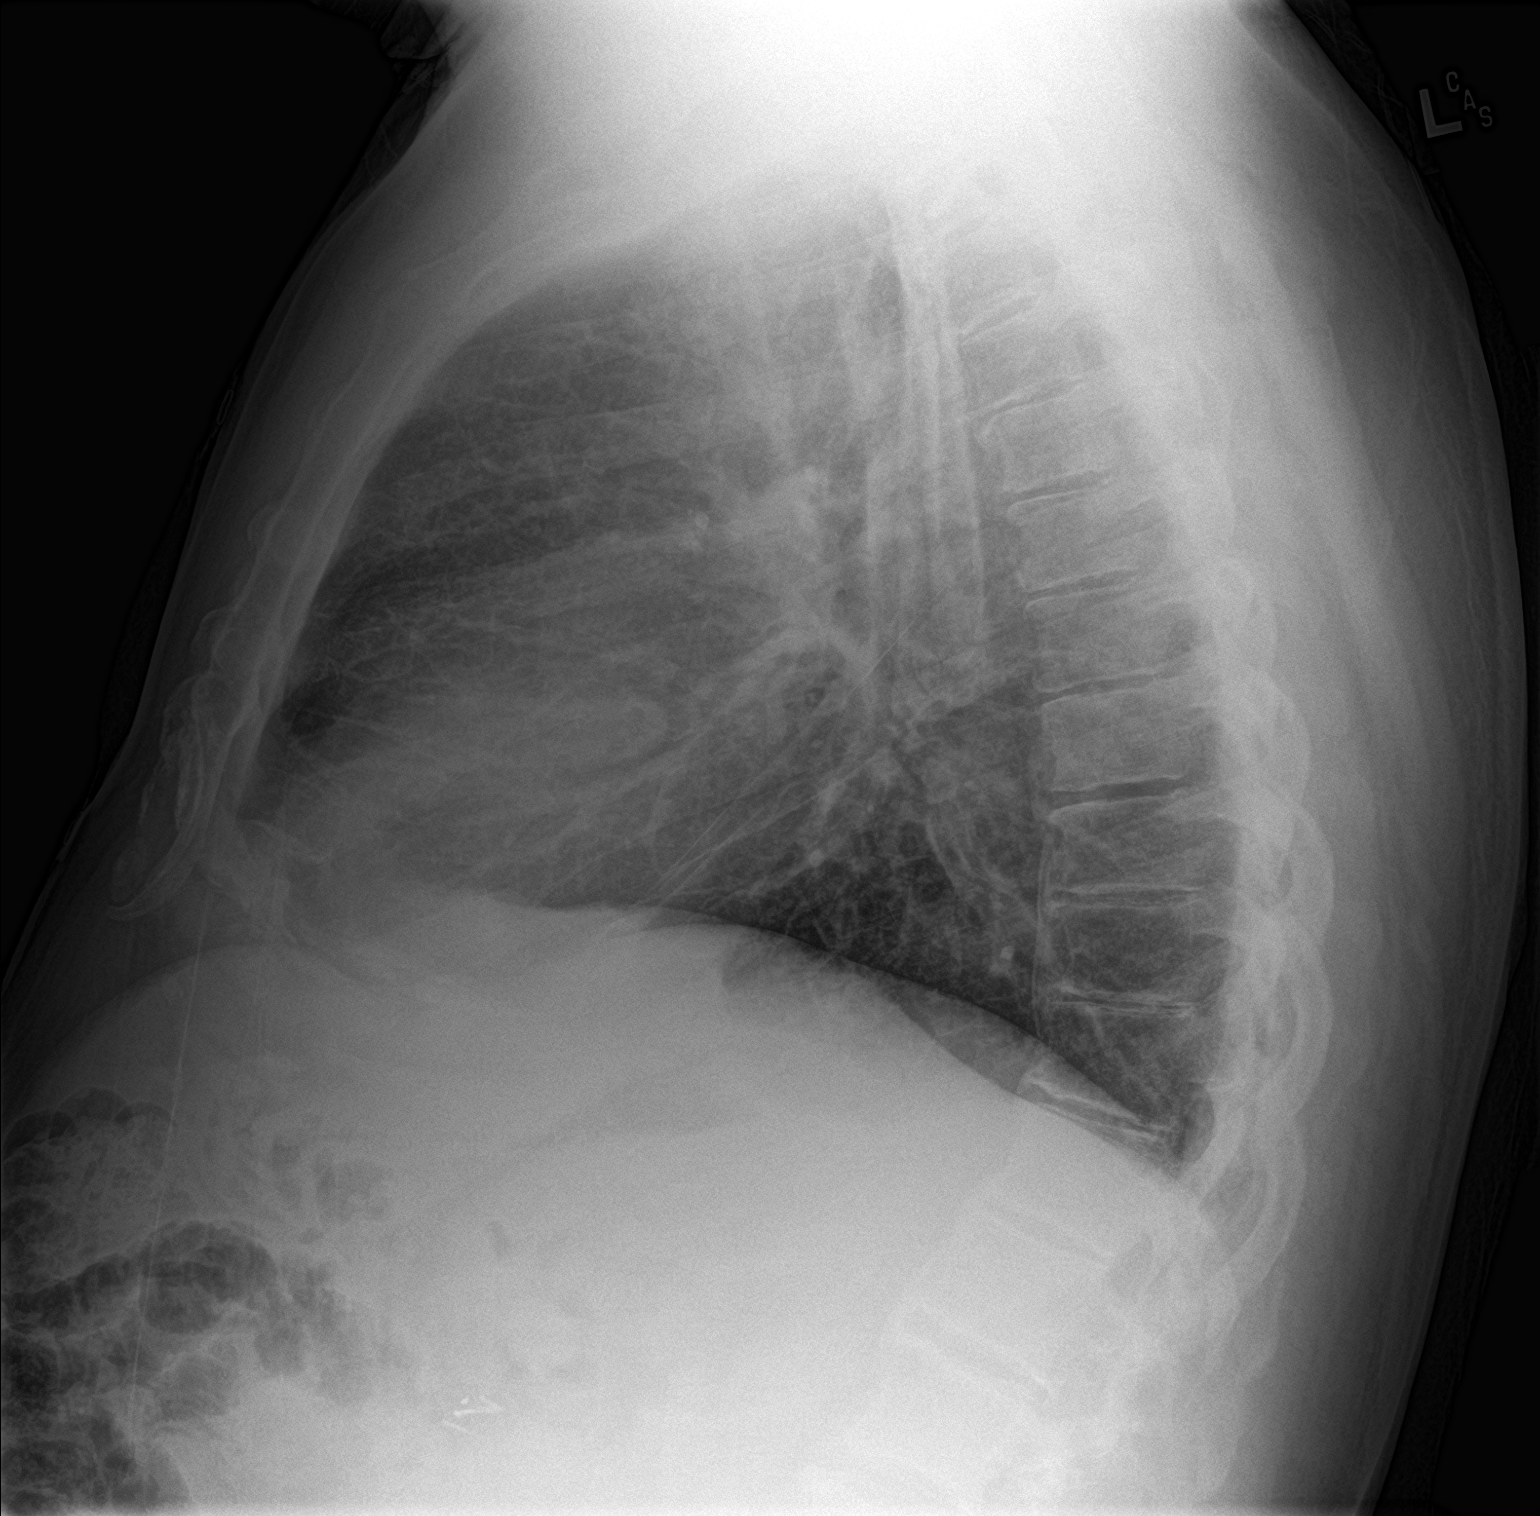

[2 of 2 positions shown; findings below may reference images not displayed]

FINDINGS: Lung volumes are normal. No consolidative airspace disease. No
pleural effusions. No pneumothorax. No pulmonary nodule or mass
noted. Pulmonary vasculature and the cardiomediastinal silhouette
are within normal limits. Orthopedic fixation hardware in the
cervical spine incidentally noted. Surgical clips project over the
right upper quadrant of the abdomen, likely from prior
cholecystectomy.
IMPRESSION: No radiographic evidence of acute cardiopulmonary disease.

## 2024-02-17 ENCOUNTER — Other Ambulatory Visit: Payer: Self-pay

## 2024-02-17 ENCOUNTER — Inpatient Hospital Stay
Admission: EM | Admit: 2024-02-17 | Discharge: 2024-02-20 | DRG: 291 | Disposition: A | Discharge status: Home / Self Care | Attending: Hospitalist | Admitting: Hospitalist

## 2024-02-17 ENCOUNTER — Emergency Department

## 2024-02-17 DIAGNOSIS — I130 Hypertensive heart and chronic kidney disease with heart failure and stage 1 through stage 4 chronic kidney disease, or unspecified chronic kidney disease: Principal | ICD-10-CM | POA: Diagnosis present

## 2024-02-17 DIAGNOSIS — E7800 Pure hypercholesterolemia, unspecified: Secondary | ICD-10-CM | POA: Diagnosis not present

## 2024-02-17 DIAGNOSIS — Z885 Allergy status to narcotic agent status: Secondary | ICD-10-CM

## 2024-02-17 DIAGNOSIS — Z823 Family history of stroke: Secondary | ICD-10-CM

## 2024-02-17 DIAGNOSIS — J9601 Acute respiratory failure with hypoxia: Secondary | ICD-10-CM | POA: Diagnosis not present

## 2024-02-17 DIAGNOSIS — Z7982 Long term (current) use of aspirin: Secondary | ICD-10-CM

## 2024-02-17 DIAGNOSIS — Z7984 Long term (current) use of oral hypoglycemic drugs: Secondary | ICD-10-CM

## 2024-02-17 DIAGNOSIS — Z7985 Long-term (current) use of injectable non-insulin antidiabetic drugs: Secondary | ICD-10-CM

## 2024-02-17 DIAGNOSIS — Z8249 Family history of ischemic heart disease and other diseases of the circulatory system: Secondary | ICD-10-CM | POA: Diagnosis not present

## 2024-02-17 DIAGNOSIS — N179 Acute kidney failure, unspecified: Secondary | ICD-10-CM | POA: Diagnosis present

## 2024-02-17 DIAGNOSIS — F17210 Nicotine dependence, cigarettes, uncomplicated: Secondary | ICD-10-CM | POA: Diagnosis present

## 2024-02-17 DIAGNOSIS — R0602 Shortness of breath: Secondary | ICD-10-CM | POA: Diagnosis not present

## 2024-02-17 DIAGNOSIS — M109 Gout, unspecified: Secondary | ICD-10-CM | POA: Diagnosis present

## 2024-02-17 DIAGNOSIS — J9621 Acute and chronic respiratory failure with hypoxia: Secondary | ICD-10-CM | POA: Diagnosis present

## 2024-02-17 DIAGNOSIS — G4733 Obstructive sleep apnea (adult) (pediatric): Secondary | ICD-10-CM | POA: Diagnosis present

## 2024-02-17 DIAGNOSIS — J441 Chronic obstructive pulmonary disease with (acute) exacerbation: Secondary | ICD-10-CM | POA: Diagnosis not present

## 2024-02-17 DIAGNOSIS — I2489 Other forms of acute ischemic heart disease: Secondary | ICD-10-CM | POA: Diagnosis present

## 2024-02-17 DIAGNOSIS — I509 Heart failure, unspecified: Secondary | ICD-10-CM | POA: Diagnosis not present

## 2024-02-17 DIAGNOSIS — I110 Hypertensive heart disease with heart failure: Secondary | ICD-10-CM | POA: Diagnosis not present

## 2024-02-17 DIAGNOSIS — J811 Chronic pulmonary edema: Secondary | ICD-10-CM | POA: Diagnosis not present

## 2024-02-17 DIAGNOSIS — I2510 Atherosclerotic heart disease of native coronary artery without angina pectoris: Secondary | ICD-10-CM | POA: Diagnosis not present

## 2024-02-17 DIAGNOSIS — I10 Essential (primary) hypertension: Secondary | ICD-10-CM | POA: Diagnosis present

## 2024-02-17 DIAGNOSIS — Z6837 Body mass index (BMI) 37.0-37.9, adult: Secondary | ICD-10-CM

## 2024-02-17 DIAGNOSIS — J449 Chronic obstructive pulmonary disease, unspecified: Secondary | ICD-10-CM | POA: Diagnosis not present

## 2024-02-17 DIAGNOSIS — E1142 Type 2 diabetes mellitus with diabetic polyneuropathy: Secondary | ICD-10-CM | POA: Diagnosis not present

## 2024-02-17 DIAGNOSIS — E66813 Obesity, class 3: Secondary | ICD-10-CM | POA: Diagnosis not present

## 2024-02-17 DIAGNOSIS — I272 Pulmonary hypertension, unspecified: Secondary | ICD-10-CM | POA: Diagnosis present

## 2024-02-17 DIAGNOSIS — Z7951 Long term (current) use of inhaled steroids: Secondary | ICD-10-CM

## 2024-02-17 DIAGNOSIS — Z888 Allergy status to other drugs, medicaments and biological substances status: Secondary | ICD-10-CM

## 2024-02-17 DIAGNOSIS — Z833 Family history of diabetes mellitus: Secondary | ICD-10-CM | POA: Diagnosis not present

## 2024-02-17 DIAGNOSIS — R0603 Acute respiratory distress: Secondary | ICD-10-CM | POA: Diagnosis not present

## 2024-02-17 DIAGNOSIS — Z83438 Family history of other disorder of lipoprotein metabolism and other lipidemia: Secondary | ICD-10-CM | POA: Diagnosis not present

## 2024-02-17 DIAGNOSIS — Z801 Family history of malignant neoplasm of trachea, bronchus and lung: Secondary | ICD-10-CM

## 2024-02-17 DIAGNOSIS — N182 Chronic kidney disease, stage 2 (mild): Secondary | ICD-10-CM | POA: Diagnosis present

## 2024-02-17 DIAGNOSIS — Z716 Tobacco abuse counseling: Secondary | ICD-10-CM

## 2024-02-17 DIAGNOSIS — Z79899 Other long term (current) drug therapy: Secondary | ICD-10-CM

## 2024-02-17 DIAGNOSIS — Z91018 Allergy to other foods: Secondary | ICD-10-CM

## 2024-02-17 DIAGNOSIS — I5033 Acute on chronic diastolic (congestive) heart failure: Secondary | ICD-10-CM | POA: Diagnosis not present

## 2024-02-17 DIAGNOSIS — R079 Chest pain, unspecified: Secondary | ICD-10-CM | POA: Diagnosis not present

## 2024-02-17 DIAGNOSIS — E1122 Type 2 diabetes mellitus with diabetic chronic kidney disease: Secondary | ICD-10-CM | POA: Diagnosis present

## 2024-02-17 DIAGNOSIS — I517 Cardiomegaly: Secondary | ICD-10-CM | POA: Diagnosis not present

## 2024-02-17 LAB — BLOOD GAS, VENOUS
Acid-base deficit: 0.4 mmol/L (ref 0.0–2.0)
Bicarbonate: 25.4 mmol/L (ref 20.0–28.0)
O2 Saturation: 87.6 %
Patient temperature: 37
pCO2, Ven: 45 mmHg (ref 44–60)
pH, Ven: 7.36 (ref 7.25–7.43)
pO2, Ven: 54 mmHg — ABNORMAL HIGH (ref 32–45)

## 2024-02-17 LAB — CBC WITH DIFFERENTIAL/PLATELET
Abs Immature Granulocytes: 0.2 10*3/uL — ABNORMAL HIGH (ref 0.00–0.07)
Basophils Absolute: 0.1 10*3/uL (ref 0.0–0.1)
Basophils Relative: 1 %
Eosinophils Absolute: 0.1 10*3/uL (ref 0.0–0.5)
Eosinophils Relative: 1 %
HCT: 42.7 % (ref 39.0–52.0)
Hemoglobin: 13.9 g/dL (ref 13.0–17.0)
Immature Granulocytes: 2 %
Lymphocytes Relative: 26 %
Lymphs Abs: 3.3 10*3/uL (ref 0.7–4.0)
MCH: 28.5 pg (ref 26.0–34.0)
MCHC: 32.6 g/dL (ref 30.0–36.0)
MCV: 87.7 fL (ref 80.0–100.0)
Monocytes Absolute: 0.8 10*3/uL (ref 0.1–1.0)
Monocytes Relative: 7 %
Neutro Abs: 8.2 10*3/uL — ABNORMAL HIGH (ref 1.7–7.7)
Neutrophils Relative %: 63 %
Platelets: 235 10*3/uL (ref 150–400)
RBC: 4.87 MIL/uL (ref 4.22–5.81)
RDW: 13.8 % (ref 11.5–15.5)
WBC: 12.7 10*3/uL — ABNORMAL HIGH (ref 4.0–10.5)
nRBC: 0 % (ref 0.0–0.2)

## 2024-02-17 LAB — TROPONIN I (HIGH SENSITIVITY)
Troponin I (High Sensitivity): 27 ng/L — ABNORMAL HIGH (ref <18)
Troponin I (High Sensitivity): 21 ng/L — ABNORMAL HIGH (ref <18)

## 2024-02-17 LAB — COMPREHENSIVE METABOLIC PANEL WITH GFR
ALT: 26 U/L (ref 0–44)
AST: 21 U/L (ref 15–41)
Albumin: 3.9 g/dL (ref 3.5–5.0)
Alkaline Phosphatase: 54 U/L (ref 38–126)
Anion gap: 11 (ref 5–15)
BUN: 21 mg/dL — ABNORMAL HIGH (ref 6–20)
CO2: 24 mmol/L (ref 22–32)
Calcium: 8.7 mg/dL — ABNORMAL LOW (ref 8.9–10.3)
Chloride: 106 mmol/L (ref 98–111)
Creatinine, Ser: 1.42 mg/dL — ABNORMAL HIGH (ref 0.61–1.24)
GFR, Estimated: 58 mL/min — ABNORMAL LOW (ref >60)
Glucose, Bld: 126 mg/dL — ABNORMAL HIGH (ref 70–99)
Potassium: 3.7 mmol/L (ref 3.5–5.1)
Sodium: 141 mmol/L (ref 135–145)
Total Bilirubin: 0.8 mg/dL (ref 0.0–1.2)
Total Protein: 6.8 g/dL (ref 6.5–8.1)

## 2024-02-17 LAB — RESP PANEL BY RT-PCR (RSV, FLU A&B, COVID)  RVPGX2
Influenza A by PCR: NEGATIVE
Influenza B by PCR: NEGATIVE
Resp Syncytial Virus by PCR: NEGATIVE
SARS Coronavirus 2 by RT PCR: NEGATIVE

## 2024-02-17 LAB — BRAIN NATRIURETIC PEPTIDE: B Natriuretic Peptide: 300 pg/mL — ABNORMAL HIGH (ref 0.0–100.0)

## 2024-02-17 LAB — CBG MONITORING, ED
Glucose-Capillary: 325 mg/dL — ABNORMAL HIGH (ref 70–99)
Glucose-Capillary: 191 mg/dL — ABNORMAL HIGH (ref 70–99)
Glucose-Capillary: 233 mg/dL — ABNORMAL HIGH (ref 70–99)

## 2024-02-17 MED ORDER — ENOXAPARIN SODIUM 60 MG/0.6ML IJ SOSY
50.0000 mg | PREFILLED_SYRINGE | INTRAMUSCULAR | Status: DC
  Administered 2024-02-17 – 2024-02-19 (×3): 50 mg via SUBCUTANEOUS
  Filled 2024-02-17 (×3): qty 0.6

## 2024-02-17 MED ORDER — LORATADINE 10 MG PO TABS
5.0000 mg | ORAL_TABLET | Freq: Every evening | ORAL | Status: DC
  Administered 2024-02-17 – 2024-02-19 (×3): 5 mg via ORAL
  Filled 2024-02-17 (×3): qty 1

## 2024-02-17 MED ORDER — NICOTINE 7 MG/24HR TD PT24
7.0000 mg | MEDICATED_PATCH | Freq: Every day | TRANSDERMAL | 0 refills | Status: DC
Start: 2024-02-17 — End: 2024-03-06
  Filled 2024-02-17: qty 14, 14d supply, fill #0

## 2024-02-17 MED ORDER — DULOXETINE HCL 30 MG PO CPEP
60.0000 mg | ORAL_CAPSULE | ORAL | Status: DC
  Administered 2024-02-17 – 2024-02-20 (×4): 60 mg via ORAL
  Filled 2024-02-17: qty 2
  Filled 2024-02-17 (×2): qty 1
  Filled 2024-02-17: qty 2

## 2024-02-17 MED ORDER — NICOTINE 21 MG/24HR TD PT24
21.0000 mg | MEDICATED_PATCH | Freq: Every day | TRANSDERMAL | Status: DC
  Administered 2024-02-17 – 2024-02-20 (×4): 21 mg via TRANSDERMAL
  Filled 2024-02-17 (×4): qty 1

## 2024-02-17 MED ORDER — TIOTROPIUM BROMIDE MONOHYDRATE 2.5 MCG/ACT IN AERS: 2.0000 | INHALATION_SPRAY | Freq: Every day | RESPIRATORY_TRACT | Status: DC

## 2024-02-17 MED ORDER — IPRATROPIUM-ALBUTEROL 0.5-2.5 (3) MG/3ML IN SOLN
9.0000 mL | Freq: Once | RESPIRATORY_TRACT | Status: AC
  Administered 2024-02-17: 9 mL via RESPIRATORY_TRACT
  Filled 2024-02-17: qty 9

## 2024-02-17 MED ORDER — SPIRONOLACTONE 25 MG PO TABS
25.0000 mg | ORAL_TABLET | Freq: Every day | ORAL | Status: DC
  Administered 2024-02-17 – 2024-02-18 (×2): 25 mg via ORAL
  Filled 2024-02-17 (×2): qty 1

## 2024-02-17 MED ORDER — INSULIN ASPART 100 UNIT/ML IJ SOLN
0.0000 [IU] | Freq: Every day | INTRAMUSCULAR | Status: DC
  Administered 2024-02-17: 2 [IU] via SUBCUTANEOUS
  Filled 2024-02-17: qty 1

## 2024-02-17 MED ORDER — FUROSEMIDE 10 MG/ML IJ SOLN
60.0000 mg | Freq: Two times a day (BID) | INTRAMUSCULAR | Status: DC
  Administered 2024-02-17 – 2024-02-18 (×2): 60 mg via INTRAVENOUS
  Filled 2024-02-17 (×2): qty 8

## 2024-02-17 MED ORDER — IPRATROPIUM-ALBUTEROL 0.5-2.5 (3) MG/3ML IN SOLN
3.0000 mL | Freq: Four times a day (QID) | RESPIRATORY_TRACT | Status: DC | PRN
  Administered 2024-02-18: 3 mL via RESPIRATORY_TRACT
  Filled 2024-02-17 (×2): qty 3

## 2024-02-17 MED ORDER — SODIUM CHLORIDE 0.9% FLUSH
3.0000 mL | Freq: Two times a day (BID) | INTRAVENOUS | Status: DC
  Administered 2024-02-17 – 2024-02-20 (×7): 3 mL via INTRAVENOUS

## 2024-02-17 MED ORDER — PREGABALIN 50 MG PO CAPS
50.0000 mg | ORAL_CAPSULE | Freq: Two times a day (BID) | ORAL | Status: DC
  Administered 2024-02-17 – 2024-02-20 (×7): 50 mg via ORAL
  Filled 2024-02-17 (×7): qty 1

## 2024-02-17 MED ORDER — FLUTICASONE FUROATE-VILANTEROL 200-25 MCG/ACT IN AEPB: 1.0000 | INHALATION_SPRAY | Freq: Every day | RESPIRATORY_TRACT | Status: DC

## 2024-02-17 MED ORDER — BISOPROLOL FUMARATE 5 MG PO TABS
10.0000 mg | ORAL_TABLET | Freq: Every day | ORAL | Status: DC
  Administered 2024-02-17 – 2024-02-18 (×2): 10 mg via ORAL
  Filled 2024-02-17 (×2): qty 2

## 2024-02-17 MED ORDER — SODIUM CHLORIDE 0.9 % IV SOLN: 250.0000 mL | INTRAVENOUS | Status: AC | PRN

## 2024-02-17 MED ORDER — FUROSEMIDE 10 MG/ML IJ SOLN
80.0000 mg | Freq: Once | INTRAMUSCULAR | Status: AC
  Administered 2024-02-17: 80 mg via INTRAVENOUS
  Filled 2024-02-17: qty 8

## 2024-02-17 MED ORDER — METHYLPREDNISOLONE SODIUM SUCC 125 MG IJ SOLR
125.0000 mg | Freq: Once | INTRAMUSCULAR | Status: AC
  Administered 2024-02-17: 125 mg via INTRAVENOUS
  Filled 2024-02-17: qty 2

## 2024-02-17 MED ORDER — BUDESON-GLYCOPYRROL-FORMOTEROL 160-9-4.8 MCG/ACT IN AERO
2.0000 | INHALATION_SPRAY | Freq: Two times a day (BID) | RESPIRATORY_TRACT | Status: DC
  Administered 2024-02-18 – 2024-02-20 (×4): 2 via RESPIRATORY_TRACT
  Filled 2024-02-17: qty 5.9

## 2024-02-17 MED ORDER — EZETIMIBE 10 MG PO TABS
10.0000 mg | ORAL_TABLET | Freq: Every day | ORAL | Status: DC
  Administered 2024-02-17 – 2024-02-20 (×4): 10 mg via ORAL
  Filled 2024-02-17 (×4): qty 1

## 2024-02-17 MED ORDER — ALLOPURINOL 100 MG PO TABS
300.0000 mg | ORAL_TABLET | Freq: Every day | ORAL | Status: DC
  Administered 2024-02-17 – 2024-02-20 (×4): 300 mg via ORAL
  Filled 2024-02-17: qty 1
  Filled 2024-02-17 (×2): qty 3
  Filled 2024-02-17: qty 1

## 2024-02-17 MED ORDER — PANTOPRAZOLE SODIUM 40 MG PO TBEC
40.0000 mg | DELAYED_RELEASE_TABLET | Freq: Every day | ORAL | Status: DC
  Administered 2024-02-17 – 2024-02-20 (×4): 40 mg via ORAL
  Filled 2024-02-17 (×4): qty 1

## 2024-02-17 MED ORDER — ASPIRIN 81 MG PO TBEC
81.0000 mg | DELAYED_RELEASE_TABLET | Freq: Every day | ORAL | Status: DC
  Administered 2024-02-18 – 2024-02-20 (×3): 81 mg via ORAL
  Filled 2024-02-17 (×3): qty 1

## 2024-02-17 MED ORDER — BACLOFEN 10 MG PO TABS
10.0000 mg | ORAL_TABLET | Freq: Three times a day (TID) | ORAL | Status: DC | PRN
  Administered 2024-02-20: 10 mg via ORAL
  Filled 2024-02-17: qty 1

## 2024-02-17 MED ORDER — EMPAGLIFLOZIN 10 MG PO TABS
10.0000 mg | ORAL_TABLET | Freq: Every day | ORAL | Status: DC
  Administered 2024-02-17 – 2024-02-20 (×4): 10 mg via ORAL
  Filled 2024-02-17 (×4): qty 1

## 2024-02-17 MED ORDER — ACETAMINOPHEN 325 MG PO TABS
650.0000 mg | ORAL_TABLET | ORAL | Status: DC | PRN
  Administered 2024-02-19 – 2024-02-20 (×3): 650 mg via ORAL
  Filled 2024-02-17 (×3): qty 2

## 2024-02-17 MED ORDER — FLUTICASONE PROPIONATE 50 MCG/ACT NA SUSP
1.0000 | Freq: Every day | NASAL | Status: DC
  Administered 2024-02-18 – 2024-02-20 (×3): 1 via NASAL
  Filled 2024-02-17: qty 16

## 2024-02-17 MED ORDER — ROSUVASTATIN CALCIUM 10 MG PO TABS
40.0000 mg | ORAL_TABLET | Freq: Every day | ORAL | Status: DC
  Administered 2024-02-17 – 2024-02-19 (×3): 40 mg via ORAL
  Filled 2024-02-17: qty 2
  Filled 2024-02-17 (×2): qty 4
  Filled 2024-02-17 (×2): qty 2

## 2024-02-17 MED ORDER — LISINOPRIL 20 MG PO TABS
40.0000 mg | ORAL_TABLET | Freq: Every day | ORAL | Status: DC
  Administered 2024-02-17 – 2024-02-18 (×2): 40 mg via ORAL
  Filled 2024-02-17 (×2): qty 4

## 2024-02-17 MED ORDER — SODIUM CHLORIDE 0.9% FLUSH: 3.0000 mL | INTRAVENOUS | Status: DC | PRN

## 2024-02-17 MED ORDER — DULOXETINE HCL 30 MG PO CPEP
30.0000 mg | ORAL_CAPSULE | Freq: Every evening | ORAL | Status: DC
  Administered 2024-02-17 – 2024-02-19 (×3): 30 mg via ORAL
  Filled 2024-02-17 (×3): qty 1

## 2024-02-17 MED ORDER — INSULIN ASPART 100 UNIT/ML IJ SOLN
0.0000 [IU] | Freq: Three times a day (TID) | INTRAMUSCULAR | Status: DC
  Administered 2024-02-17: 3 [IU] via SUBCUTANEOUS
  Administered 2024-02-17: 11 [IU] via SUBCUTANEOUS
  Administered 2024-02-18: 5 [IU] via SUBCUTANEOUS
  Administered 2024-02-18 – 2024-02-19 (×3): 3 [IU] via SUBCUTANEOUS
  Administered 2024-02-19: 2 [IU] via SUBCUTANEOUS
  Administered 2024-02-19: 3 [IU] via SUBCUTANEOUS
  Filled 2024-02-17 (×9): qty 1

## 2024-02-17 MED ORDER — ONDANSETRON HCL 4 MG/2ML IJ SOLN: 4.0000 mg | Freq: Four times a day (QID) | INTRAMUSCULAR | Status: DC | PRN

## 2024-02-17 MED ORDER — AMLODIPINE BESYLATE 10 MG PO TABS
10.0000 mg | ORAL_TABLET | Freq: Every day | ORAL | Status: DC
  Administered 2024-02-17 – 2024-02-20 (×4): 10 mg via ORAL
  Filled 2024-02-17: qty 2
  Filled 2024-02-17 (×2): qty 1
  Filled 2024-02-17: qty 2

## 2024-02-17 MED ORDER — NICOTINE POLACRILEX 4 MG MT LOZG
4.0000 mg | LOZENGE | OROMUCOSAL | 0 refills | Status: DC | PRN
  Filled 2024-02-17: qty 72, 3d supply, fill #0
  Filled 2024-02-17: qty 72, 4d supply, fill #0
  Filled 2024-03-03: qty 24, 1d supply, fill #1

## 2024-02-17 MED ORDER — ASPIRIN 81 MG PO CHEW
324.0000 mg | CHEWABLE_TABLET | Freq: Once | ORAL | Status: AC
  Administered 2024-02-17: 324 mg via ORAL
  Filled 2024-02-17: qty 4

## 2024-02-17 NOTE — Progress Notes (Signed)
 Heart Failure Nurse Navigator Progress Note  PCP: Adeline Hone, PA-C PCP-Cardiologist: Constancia Delton Admission Diagnosis: Respiratory distress Acute on chronic congestive heart failure, unspecified heart failure type (HCC) Acute exacerbation of chronic obstructive pulmonary disease (COPD HCC) Encounter for smoking cessation counseling  Admitted from: Home via POV  Presentation:   Cory Rivas presented with chest pain and shortness of breath.  Patient with history of COPD, CHF,  Hypertension and Diabetes. He had a cough and shortness of breath, orthopnea or exertional dyspnea and chest tightness worsening ever since his discharge form the hospital last month. He had notice swelling in both legs. He continues to smoke half a pack of cigarettes per day. Chest x-ray: findings consistent with CHF. BNP 300.0.  ECHO/ LVEF: 55-60%-11/12/2023  Clinical Course:  Past Medical History:  Diagnosis Date   Bulging of cervical intervertebral disc    CHF (congestive heart failure) (HCC)    COPD (chronic obstructive pulmonary disease) (HCC)    Diabetes mellitus without complication (HCC)    Gout    High cholesterol    Hypertension    Stress due to family tension 04/15/2019     Social History   Socioeconomic History   Marital status: Divorced    Spouse name: Not on file   Number of children: 0   Years of education: 9   Highest education level: 9th grade  Occupational History   Occupation: disability  Tobacco Use   Smoking status: Some Days    Current packs/day: 0.00    Average packs/day: 1 pack/day for 37.0 years (37.0 ttl pk-yrs)    Types: Cigarettes    Start date: 10/19/1989    Last attempt to quit: 08/20/2022    Years since quitting: 1.4   Smokeless tobacco: Never   Tobacco comments:    30+ years hx smoking 2 ppd, stopped smoking briefly in April 2022 for a surgery and then restarted smoking about 1/2 ppd last 4-5 months         5-6cigs daily- 04/10/2023  Vaping Use   Vaping  status: Never Used  Substance and Sexual Activity   Alcohol use: No   Drug use: No   Sexual activity: Not Currently    Partners: Female  Other Topics Concern   Not on file  Social History Narrative   Lives with sister Jenette Mitchell, brother in law Genella Kendall), and his mother - Scotty Kratovil   Social Drivers of Health   Financial Resource Strain: High Risk (01/21/2024)   Overall Financial Resource Strain (CARDIA)    Difficulty of Paying Living Expenses: Hard  Food Insecurity: No Food Insecurity (02/17/2024)   Hunger Vital Sign    Worried About Running Out of Food in the Last Year: Never true    Ran Out of Food in the Last Year: Never true  Transportation Needs: No Transportation Needs (02/17/2024)   PRAPARE - Administrator, Civil Service (Medical): No    Lack of Transportation (Non-Medical): No  Physical Activity: Insufficiently Active (12/06/2022)   Exercise Vital Sign    Days of Exercise per Week: 3 days    Minutes of Exercise per Session: 10 min  Stress: No Stress Concern Present (12/06/2022)   [employer] of Occupational Health - Occupational Stress Questionnaire    Feeling of Stress : Only a little  Social Connections: Socially Isolated (02/17/2024)   Social Connection and Isolation Panel [NHANES]    Frequency of Communication with Friends and Family: More than three times a week  Frequency of Social Gatherings with Friends and Family: Twice a week    Attends Religious Services: Never    Database administrator or Organizations: No    Attends Engineer, structural: Never    Marital Status: Divorced   Water engineer and Provision:  Detailed education and instructions provided on heart failure disease management including the following:  Signs and symptoms of Heart Failure When to call the physician Importance of daily weights Low sodium diet Fluid restriction Medication management Anticipated future follow-up appointments  Patient education given on each  of the above topics.  Patient acknowledges understanding via teach back method and acceptance of all instructions.  Education Materials:  "Living Better With Heart Failure" Booklet, HF zone tool, & Daily Weight Tracker Tool.  Patient has scale at home: Yes Patient has pill box at home: Yes    High Risk Criteria for Readmission and/or Poor Patient Outcomes: Heart failure hospital admissions (last 6 months): 1  No Show rate: 3% Difficult social situation: None Demonstrates medication adherence: Yes Primary Language: English Literacy level: Reading, Writing, Comprehension  Barriers of Care:   Financial Concerns  Considerations/Referrals:   Referral made to Heart Failure Pharmacist Stewardship: Yes Referral made to Heart Failure CSW/NCM TOC: No Referral made to Heart & Vascular TOC clinic: Yes. 02/25/24 @ 9:30 AM  Items for Follow-up on DC/TOC: Diet & Fluid Restrictions Daily Weights Continued Heart Failure Medications  Celedonio Coil, RN, BSN [facility] Heart Failure Navigator Secure Chat Only

## 2024-02-17 NOTE — Progress Notes (Signed)
 Heart Failure Stewardship Pharmacy Note  PCP: Adeline Hone, PA-C PCP-Cardiologist: Constancia Delton, MD  HPI: Cory Rivas is a 58 y.o. male with CAD, chronic HFpEF, COPD Gold stage II, HTN, HLD, gout, CKD stage II who presented with cough, shortness of breath, orthopnea, exertional dyspnea, chest tightness, and leg swelling. Patient has gained 7-8 pounds since previous discharge dry weight. On admission, BNP was 300.0 and HS-troponin was 27. Chest x-ray noted mild cardiomegaly and interstitial edema.   Pertinent cardiac history: Stress test in 09/2020 was low risk, LVEF was 59%. Echo 10/2020 with LVEF 60-65% and grade I diastolic dysfunction. Echo 08/2022 with LVEF 60-65%, mild LVH, grade II diastolic dysfunction. Echo 05/2023 with LVEF 50-55%, moderate LVH, grade III diastolic dysfunction. LHC 05/2023 noted moderate nonobstructive CAD. RHC 05/2023 showed RA 7, PA 45/16 (26), PCWP 18, CI 2.4, PVR 1.6 WU. LHC 10/2023 with similar nonobstructive CAD. RHC 10/2023 with PA 39/14 (22), PCWP 11, CI 2.5, PVR 2.1 WU.   Pertinent Lab Values: Creat  Date Value Ref Range Status  02/10/2024 1.30 0.70 - 1.30 mg/dL Final   Creatinine, Ser  Date Value Ref Range Status  02/17/2024 1.42 (H) 0.61 - 1.24 mg/dL Final   BUN  Date Value Ref Range Status  02/17/2024 21 (H) 6 - 20 mg/dL Final  40/98/1191 17 6 - 24 mg/dL Final   Potassium  Date Value Ref Range Status  02/17/2024 3.7 3.5 - 5.1 mmol/L Final   Sodium  Date Value Ref Range Status  02/17/2024 141 135 - 145 mmol/L Final  11/15/2023 142 134 - 144 mmol/L Final   Brain Natriuretic Peptide  Date Value Ref Range Status  07/01/2020 66 <100 pg/mL Final    Comment:    . BNP levels increase with age in the general population with the highest values seen in individuals greater than 57 years of age. Reference: J. Am. Rosetta Cons. Cardiol. 2002; 47:829-562. .    B Natriuretic Peptide  Date Value Ref Range Status  02/17/2024 300.0 (H) 0.0 - 100.0  pg/mL Final    Comment:    Performed at [facility], [address] [place], [place] [address]   Magnesium   Date Value Ref Range Status  10/24/2023 2.2 1.7 - 2.4 mg/dL Final    Comment:    Performed at [facility], [address] [place], [place] [address]   Hemoglobin A1C  Date Value Ref Range Status  11/28/2023 7.9 (A) 4.0 - 5.6 % Final  01/02/2022 6.5  Final   Hgb A1c MFr Bld  Date Value Ref Range Status  08/09/2023 9.8 (H) <5.7 % of total Hgb Final    Comment:    For someone without known diabetes, a hemoglobin A1c value of 6.5% or greater indicates that they may have  diabetes and this should be confirmed with a follow-up  test. . For someone with known diabetes, a value <7% indicates  that their diabetes is well controlled and a value  greater than or equal to 7% indicates suboptimal  control. A1c targets should be individualized based on  duration of diabetes, age, comorbid conditions, and  other considerations. . Currently, no consensus exists regarding use of hemoglobin A1c for diagnosis of diabetes for children. .    TSH  Date Value Ref Range Status  12/18/2017 1.86 0.41 - 5.90 Final    Vital Signs: Admission weight: 107.6 kg  Temp:  [97.8 F (36.6 C)] 97.8 F (36.6 C) (05/05 0449) Pulse Rate:  [66-86] 66 (05/05  0800) Cardiac Rhythm: Normal sinus rhythm (05/05 0518) Resp:  [19-27] 19 (05/05 0800) BP: (157-167)/(84-99) 157/99 (05/05 0800) SpO2:  [92 %-97 %] 97 % (05/05 0800) FiO2 (%):  [35 %] 35 % (05/05 0530) Weight:  [107.6 kg (237 lb 4.8 oz)] 107.6 kg (237 lb 4.8 oz) (05/05 0449)  Intake/Output Summary (Last 24 hours) at 02/17/2024 0840 Last data filed at 02/17/2024 0726 Gross per 24 hour  Intake --  Output 625 ml  Net -625 ml    Current Heart Failure Medications:  Loop diuretic: furosemide  60 mg IV BID Beta-Blocker: bisoprolol  10 mg daily ACEI/ARB/ARNI: lisinopril  40 mg daily  MRA: spironolactone  25 mg daily  SGLT2i:  Jardiance  10 mg daily  Other: amlodipine  10 mg daily   Prior to admission Heart Failure Medications:  Loop diuretic: torsemide  20 mg BID Beta-Blocker: bisoprolol  10 mg  ACEI/ARB/ARNI: lisinopril  40 mg daily  MRA: spironolactone  25 mg daily SGLT2i: Jardiance  10 mg daily  Other: amlodipine  10 mg daily    Assessment: 1. Acute on chronic diastolic heart failure (LVEF 50-55%) with grade III diastolic dysfunction, due to NICM. NYHA class II-III symptoms.  -Symptoms: Patient reports breathing is improving. No LEE on exam. Symptoms probably due to COPD and HF.  -Volume: Patient appears nearly euvolemic, though difficult to assess due to body habitus. Creatine and BUN elevated on admission. Continue furosemide  60 mg IV BID.  -Hemodynamics: BP remains elevated. HR 60-70s.  -BB: Continue bisoprolol  10 mg daily.   -ACEI/ARB/ARNI: Previously did not tolerate Entresto . Continue lisinopril  40 mg daily.  -MRA: Continue spironolactone  25 mg daily.  -SGLT2i: Continue Jardiance  10 mg daily.    Plan: 1) Medication changes recommended at this time: None  2) Patient assistance: None  3) Education: -To be completed prior to discharge. - Patient has been educated on current HF medications and potential additions to HF medication regimen - Patient verbalizes understanding that over the next few months, these medication doses may change and more medications may be added to optimize HF regimen - Patient has been educated on basic disease state pathophysiology and goals of therapy   Medication Assistance / Insurance Benefits Check: Does the patient have prescription insurance?  Medicaid    Outpatient Pharmacy: Prior to admission outpatient pharmacy: [facility]       Galvin Jules, Student Pharmacist

## 2024-02-17 NOTE — ED Provider Notes (Signed)
 [facility] Provider Note    Event Date/Time   First MD Initiated Contact with Patient 02/17/24 [phone]     (approximate)   History   Chest Pain and Shortness of Breath   HPI  Cory Rivas is a 58 y.o. male   Past medical history of OPD, CHF, high blood pressure, diabetes, here with difficulty breathing.  He has had a cough and shortness of breath, orthopnea, or exertional dyspnea and chest tightness worsening ever since his discharge from the hospital last month.  He was hospitalized for CHF and COPD exacerbations.  He felt better upon discharge but quickly the symptoms reappeared and have been progressively worsening since then.  He has noticed swelling in both of his legs.  He has been taking all medications as prescribed.  He continues to smoke half a pack of cigarettes per day.  External Medical Documents Reviewed: Hospitalization notes from last month for CHF and COPD exacerbations      Physical Exam   Triage Vital Signs: ED Triage Vitals  Encounter Vitals Group     BP 02/17/24 0451 (!) 167/92     Systolic BP Percentile --      Diastolic BP Percentile --      Pulse Rate 02/17/24 0451 86     Resp 02/17/24 0451 (!) 24     Temp 02/17/24 0449 97.8 F (36.6 C)     Temp Source 02/17/24 0449 Oral     SpO2 02/17/24 0451 92 %     Weight 02/17/24 0449 237 lb 4.8 oz (107.6 kg)     Height 02/17/24 0449 5\' 4"  (1.626 m)     Head Circumference --      Peak Flow --      Pain Score 02/17/24 0449 5     Pain Loc --      Pain Education --      Exclude from Growth Chart --     Most recent vital signs: Vitals:   02/17/24 0449 02/17/24 0451  BP:  (!) 167/92  Pulse:  86  Resp:  (!) 24  Temp: 97.8 F (36.6 C)   SpO2:  92%    General: Awake, CV:  Bilateral lower extremity pitting edema Resp:  Tachypnea and increased work of breathing speaking a very short phrases only.  Wheezing and rales. Abd:  No distention.  Other:  He has B-lines throughout  all lung fields on ultrasound, grossly normal ejection fraction with no obvious pericardial effusions, no obvious right heart strain   ED Results / Procedures / Treatments   Labs (all labs ordered are listed, but only abnormal results are displayed) Labs Reviewed  RESP PANEL BY RT-PCR (RSV, FLU A&B, COVID)  [id number]  BRAIN NATRIURETIC PEPTIDE  COMPREHENSIVE METABOLIC PANEL WITH GFR  CBC WITH DIFFERENTIAL/PLATELET  BLOOD GAS, VENOUS  TROPONIN I (HIGH SENSITIVITY)    EKG  ED ECG REPORT I, Buell Carmin, the attending physician, personally viewed and interpreted this ECG.   Date: 02/17/2024  EKG Time: 0452  Rate: 96  Rhythm: sinus  Axis: nl  Intervals:nl  ST&T Change: TWI inferolateral leads also seen in previous EKG from last month, no stemi     RADIOLOGY I independently reviewed and interpreted diffuse opacities consistent with pulmonary edema I also reviewed radiologist's formal read.   PROCEDURES:  Critical Care performed: Yes, see critical care procedure note(s)  .Critical Care  Performed by: Buell Carmin, MD Authorized by: Buell Carmin, MD   Critical care  provider statement:    Critical care time (minutes):  30   Critical care was time spent personally by me on the following activities:  Development of treatment plan with patient or surrogate, discussions with consultants, evaluation of patient's response to treatment, examination of patient, ordering and review of laboratory studies, ordering and review of radiographic studies, ordering and performing treatments and interventions, pulse oximetry, re-evaluation of patient's condition and review of old charts    MEDICATIONS ORDERED IN ED: Medications  furosemide  (LASIX ) injection 80 mg (has no administration in time range)  ipratropium-albuterol  (DUONEB) 0.5-2.5 (3) MG/3ML nebulizer solution 9 mL (has no administration in time range)  methylPREDNISolone  sodium succinate (SOLU-MEDROL ) 125 mg/2 mL injection 125 mg (has  no administration in time range)  aspirin  chewable tablet 324 mg (has no administration in time range)     IMPRESSION / MDM / ASSESSMENT AND PLAN / ED COURSE  I reviewed the triage vital signs and the nursing notes.                                Patient's presentation is most consistent with acute presentation with potential threat to life or bodily function.  Differential diagnosis includes, but is not limited to, acute respiratory failure due to CHF and COPD, respiratory infection, ACS, considered but less likely PE   The patient is on the cardiac monitor to evaluate for evidence of arrhythmia and/or significant heart rate changes.  MDM:    I think he has a little bit of both COPD and CHF.  He looks fluid overloaded on clinical exam and ultrasound findings consistent with CHF exacerbation.  He has gained about 7-8 pounds since his discharge dry weight.  I will give him IV Lasix .  I put him on BiPAP due to his increased work of breathing.  I will avoid nitro because he says he broke out in a whole-body rash after getting nitro in the past.  I think he probably has a COPD exacerbation given his ongoing smoking, productive cough, and wheezing on auscultation and so I will give some DuoNebs and Solu-Medrol  as well.  Chest tightness is likely due to the respiratory problems as above, will check for ACS with EKG and serial troponins.  Give aspirin .  Checking basic labs, troponins, BNP, viral panel, and chest x-ray.  He will need to be admitted given his respiratory distress.    --- I spent 5 minutes counseling this patient on smoking cessation.  We spoke about the patient's current tobacco use, impact of smoking, assessed willingness to quit, methods for cessation including medical management and nicotine  replacement therapy (which I prescribed to the patient) and advised follow-up with primary doctor to continue to address smoking cessation.       FINAL CLINICAL IMPRESSION(S) /  ED DIAGNOSES   Final diagnoses:  Respiratory distress  Acute on chronic congestive heart failure, unspecified heart failure type (HCC)  Acute exacerbation of chronic obstructive pulmonary disease (COPD) (HCC)  Encounter for smoking cessation counseling     Rx / DC Orders   ED Discharge Orders          Ordered    nicotine  polacrilex (NICOTINE  MINI) 4 MG lozenge  As needed        02/17/24 0504    nicotine  (NICODERM CQ  - DOSED IN MG/24 HR) 7 mg/24hr patch  Daily        02/17/24 0504  Note:  This document was prepared using Dragon voice recognition software and may include unintentional dictation errors.    Buell Carmin, MD 02/17/24 [phone]

## 2024-02-17 NOTE — ED Triage Notes (Addendum)
 Pt arrives via POV with CC of CP and SOB. Hx of COPD and CHF. Fluid overloaded - bilateral lower leg edema.

## 2024-02-17 NOTE — H&P (Signed)
 History and Physical    Cory Rivas WJX:[id number] DOB: 09-27-1966 DOA: 02/17/2024  PCP: Adeline Hone, PA-C (Confirm with patient/family/NH records and if not entered, this has to be entered at [facility] point of entry) Patient coming from: Home  I have personally briefly reviewed patient's old medical records in [facility]  Chief Complaint: Cough, SOB  HPI: Cory Rivas is a 58 y.o. male with medical history significant of CAD, chronic HFpEF, COPD Gold stage II, HTN, HLD, COVID, CKD stage II, presented with worsening of cough and shortness of breath.  Symptoms started 1 week ago, patient started to have cough wheezing, coughing up clear phlegm but no fever chills no peripheral edema.  Was diagnosed with COPD exacerbation and started on Medrol  pack which patient completed yesterday.  Initially his symptoms got some improvement however last 2 days breathing symptoms became worse, last night could not lie flat on bed because of shortness of breath and decided to come to ED.  Denies any chest pain no fever or chills.  ED Course: Afebrile, nontachycardic blood pressure 160/90 O2 saturation 9 97% on 35% of BiPAP.  Chest x-ray showed pulmonary congestion, blood work showed BUN 21 creatinine 1.4 compared to baseline 1.3-1.6 WBC 12.7 hemoglobin 13.9.  Patient was given IV Lasix  80 mg x 1, IV Solu-Medrol  and DuoNebs.  Review of Systems: As per HPI otherwise 14 point review of systems negative.    Past Medical History:  Diagnosis Date   Bulging of cervical intervertebral disc    CHF (congestive heart failure) (HCC)    COPD (chronic obstructive pulmonary disease) (HCC)    Diabetes mellitus without complication (HCC)    Gout    High cholesterol    Hypertension    Stress due to family tension 04/15/2019    Past Surgical History:  Procedure Laterality Date   BACK SURGERY     CARDIAC CATHETERIZATION     CHOLECYSTECTOMY     COLONOSCOPY WITH PROPOFOL  N/A 03/10/2020   Procedure:  COLONOSCOPY WITH PROPOFOL ;  Surgeon: Marnee Sink, MD;  Location: [facility] ENDOSCOPY;  Service: Endoscopy;  Laterality: N/A;   COLONOSCOPY WITH PROPOFOL  N/A 09/25/2023   Procedure: COLONOSCOPY WITH PROPOFOL ;  Surgeon: Marnee Sink, MD;  Location: [facility] ENDOSCOPY;  Service: Endoscopy;  Laterality: N/A;   CORONARY PRESSURE/FFR STUDY N/A 05/29/2023   Procedure: CORONARY PRESSURE/FFR STUDY;  Surgeon: Sammy Crisp, MD;  Location: [facility] INVASIVE CV LAB;  Service: Cardiovascular;  Laterality: N/A;   POLYPECTOMY  09/25/2023   Procedure: POLYPECTOMY;  Surgeon: Marnee Sink, MD;  Location: [facility] ENDOSCOPY;  Service: Endoscopy;;   RIGHT/LEFT HEART CATH AND CORONARY ANGIOGRAPHY N/A 05/29/2023   Procedure: RIGHT/LEFT HEART CATH AND CORONARY ANGIOGRAPHY;  Surgeon: Sammy Crisp, MD;  Location: [facility] INVASIVE CV LAB;  Service: Cardiovascular;  Laterality: N/A;   RIGHT/LEFT HEART CATH AND CORONARY ANGIOGRAPHY Bilateral 10/30/2023   Procedure: RIGHT/LEFT HEART CATH AND CORONARY ANGIOGRAPHY;  Surgeon: Sammy Crisp, MD;  Location: [facility] INVASIVE CV LAB;  Service: Cardiovascular;  Laterality: Bilateral;     reports that he has been smoking cigarettes. He started smoking about 34 years ago. He has a 37 pack-year smoking history. He has never used smokeless tobacco. He reports that he does not drink alcohol and does not use drugs.  Allergies  Allergen Reactions   Oxycodone  Nausea And Vomiting   Entresto  [Sacubitril -Valsartan ] Nausea Only and Other (See Comments)    Lightheaded and dizzy    Onion Nausea And Vomiting   Nitroglycerin  Itching and Rash  Family History  Problem Relation Age of Onset   Heart disease Mother    Diabetes Mother    Hyperlipidemia Mother    Hypertension Mother    Heart attack Mother    Lung cancer Father    Hypertension Father    Stroke Father    AAA (abdominal aortic aneurysm) Maternal Grandmother    Heart attack Maternal Grandmother    Diabetes Maternal Grandfather       Prior to Admission medications   Medication Sig Start Date End Date Taking? Authorizing Provider  allopurinol  (ZYLOPRIM ) 300 MG tablet Take 1 tablet (300 mg total) by mouth daily with 100 mg tablet for a total of 400mg  daily for gout. 11/11/23  Yes Tapia, Leisa, PA-C  amLODipine  (NORVASC ) 10 MG tablet Take 1 tablet (10 mg total) by mouth daily. 10/30/23  Yes End, Veryl Gottron, MD  aspirin  EC 81 MG tablet Take 1 tablet (81 mg total) by mouth daily. Swallow whole. 11/11/23  Yes Tapia, Leisa, PA-C  baclofen  (LIORESAL ) 10 MG tablet Take 1 tablet (10 mg total) by mouth 3 (three) times daily as needed for muscle spasms. 01/27/24  Yes Tapia, Leisa, PA-C  bisoprolol  (ZEBETA ) 10 MG tablet Take 1 tablet (10 mg total) by mouth daily. 02/11/24 02/10/25 Yes Dunn, Elvia Hammans, PA-C  budesonide -formoterol  (SYMBICORT ) 160-4.5 MCG/ACT inhaler Inhale 2 puffs into the lungs 2 (two) times daily. 10/24/23  Yes Althia Atlas, MD  cromolyn  (OPTICROM ) 4 % ophthalmic solution Place 1 drop into both eyes 4 (four) times daily as needed. 02/10/24  Yes Tapia, Leisa, PA-C  Dulaglutide  (TRULICITY ) 3 MG/0.5ML SOAJ Inject 3 mg into the skin once a week. 09/05/23  Yes   DULoxetine  (CYMBALTA ) 30 MG capsule Take 2 capsules (60 mg total) by mouth every morning AND 1 capsule (30 mg total) every evening. 11/11/23  Yes Tapia, Leisa, PA-C  empagliflozin  (JARDIANCE ) 10 MG TABS tablet Take 1 tablet (10 mg total) by mouth daily. 11/11/23  Yes Tapia, Leisa, PA-C  Evolocumab  (REPATHA ) 140 MG/ML SOSY Inject 140 mg into the skin every 14 (fourteen) days. 12/21/23  Yes Dunn, Elvia Hammans, PA-C  ezetimibe  (ZETIA ) 10 MG tablet Take 1 tablet (10 mg total) by mouth daily. 11/11/23  Yes Tapia, Leisa, PA-C  fluticasone  (FLONASE ) 50 MCG/ACT nasal spray Place 1 spray into both nostrils daily. 02/10/24 02/09/25 Yes Tapia, Leisa, PA-C  ipratropium-albuterol  (DUONEB) 0.5-2.5 (3) MG/3ML SOLN Take 3 mLs by nebulization every 6 (six) hours as needed. 02/10/24  Yes Tapia, Leisa, PA-C   levocetirizine (XYZAL ) 5 MG tablet Take 1 tablet (5 mg total) by mouth every evening. 02/10/24  Yes Tapia, Leisa, PA-C  lisinopril  (ZESTRIL ) 40 MG tablet Take 1 tablet (40 mg total) by mouth daily. 11/04/23  Yes Tapia, Leisa, PA-C  nicotine  (NICODERM CQ  - DOSED IN MG/24 HR) 7 mg/24hr patch Place 1 patch (7 mg total) onto the skin daily. 02/17/24 02/16/25 Yes Buell Carmin, MD  nicotine  polacrilex (NICOTINE  MINI) 4 MG lozenge Take 1 lozenge (4 mg total) by mouth as needed. 02/17/24  Yes Buell Carmin, MD  pantoprazole  (PROTONIX ) 40 MG tablet Take 1 tablet (40 mg total) by mouth daily. 09/16/23  Yes Quinton Buckler, FNP  pantoprazole  (PROTONIX ) 40 MG tablet Take 1 tablet (40 mg total) by mouth daily. 06/21/23  Yes Tapia, Leisa, PA-C  predniSONE  (DELTASONE ) 20 MG tablet Take 3 tablets (60 mg total) by mouth daily for 3 days, THEN 2 tablets (40 mg total) daily for 3 days, THEN 1 tablet (20 mg total)  daily for 3 days. 02/10/24 02/19/24 Yes Tapia, Leisa, PA-C  pregabalin  (LYRICA ) 50 MG capsule Take 1 capsule (50 mg total) by mouth 2 (two) times daily. 11/11/23  Yes Tapia, Leisa, PA-C  rosuvastatin  (CRESTOR ) 40 MG tablet Take 1 tablet (40 mg total) by mouth daily. 11/11/23  Yes Tapia, Leisa, PA-C  spironolactone  (ALDACTONE ) 25 MG tablet Take 1 tablet (25 mg total) by mouth daily. 01/29/24 04/29/24 Yes Hackney, Cleotis Daily, FNP  Tiotropium Bromide  Monohydrate (SPIRIVA  RESPIMAT) 2.5 MCG/ACT AERS Inhale 2 puffs into the lungs daily. 11/05/23  Yes Dgayli, Berneta Brightly, MD  torsemide  (DEMADEX ) 20 MG tablet Take 20 mg by mouth 2 (two) times daily. 11/04/23  Yes [provider]  TRULICITY  3 MG/0.5ML SOPN Inject 3 mg into the skin once a week. SATURDAYS 03/26/22  Yes [provider]  VENTOLIN  HFA 108 (90 Base) MCG/ACT inhaler INHALE TWO PUFFS INTO THE LUNGS EVERY SIX HOURS AS NEEDED FOR WHEEZING OR SHORTNESS OF BREATH 04/26/23  Yes Tapia, Leisa, PA-C  Continuous Glucose Receiver (DEXCOM G7 RECEIVER) DEVI Dispense one reciever to be  used with 10 d devices for IDDM Patient not taking: Reported on 02/11/2024 10/31/23   Adeline Hone, PA-C  Continuous Glucose Receiver (FREESTYLE LIBRE 3 READER) DEVI Use with libre 3 device as directed Patient not taking: Reported on 02/11/2024 11/04/23   Adeline Hone, PA-C  Continuous Glucose Sensor (FREESTYLE LIBRE 3 SENSOR) MISC Place 1 sensor onto skin every 14 (fourteen) days to monitor blood sugar continuously Patient not taking: Reported on 02/11/2024 11/04/23   Tapia, Leisa, PA-C  Glucagon  (GVOKE HYPOPEN  1-PACK) 1 MG/0.2ML SOAJ Inject 1 mg into the skin as needed. 11/01/23   Adeline Hone, PA-C    Physical Exam: Vitals:   02/17/24 0449 02/17/24 0451 02/17/24 0530 02/17/24 0800  BP:  (!) 167/92 (!) 161/84 (!) 157/99  Pulse:  86 72 66  Resp:  (!) 24 (!) 27 19  Temp: 97.8 F (36.6 C)     TempSrc: Oral     SpO2:  92% 97% 97%  Weight: 107.6 kg     Height: 5\' 4"  (1.626 m)       Constitutional: NAD, calm, comfortable Vitals:   02/17/24 0449 02/17/24 0451 02/17/24 0530 02/17/24 0800  BP:  (!) 167/92 (!) 161/84 (!) 157/99  Pulse:  86 72 66  Resp:  (!) 24 (!) 27 19  Temp: 97.8 F (36.6 C)     TempSrc: Oral     SpO2:  92% 97% 97%  Weight: 107.6 kg     Height: 5\' 4"  (1.626 m)      Eyes: PERRL, lids and conjunctivae normal ENMT: Mucous membranes are moist. Posterior pharynx clear of any exudate or lesions.Normal dentition.  Neck: normal, supple, no masses, no thyromegaly Respiratory: clear to auscultation bilaterally, no wheezing, fine crackles to bilateral mid lung levels, increasing respiratory effort. No accessory muscle use.  Cardiovascular: Regular rate and rhythm, no murmurs / rubs / gallops. No extremity edema. 2+ pedal pulses. No carotid bruits.  Abdomen: no tenderness, no masses palpated. No hepatosplenomegaly. Bowel sounds positive.  Musculoskeletal: no clubbing / cyanosis. No joint deformity upper and lower extremities. Good ROM, no contractures. Normal muscle tone.  Skin: no  rashes, lesions, ulcers. No induration Neurologic: CN 2-12 grossly intact. Sensation intact, DTR normal. Strength 5/5 in all 4.  Psychiatric: Normal judgment and insight. Alert and oriented x 3. Normal mood.     Labs on Admission: I have personally reviewed following labs and imaging studies  CBC: Recent Labs  Lab 02/17/24 0453  WBC 12.7*  NEUTROABS 8.2*  HGB 13.9  HCT 42.7  MCV 87.7  PLT 235   Basic Metabolic Panel: Recent Labs  Lab 02/10/24 1053 02/17/24 0453  NA 140 141  K 4.3 3.7  CL 101 106  CO2 31 24  GLUCOSE 113* 126*  BUN 12 21*  CREATININE 1.30 1.42*  CALCIUM  9.1 8.7*   GFR: Estimated Creatinine Clearance: 63.8 mL/min (A) (by C-G formula based on SCr of 1.42 mg/dL (H)). Liver Function Tests: Recent Labs  Lab 02/17/24 0453  AST 21  ALT 26  ALKPHOS 54  BILITOT 0.8  PROT 6.8  ALBUMIN 3.9   No results for input(s): "LIPASE", "AMYLASE" in the last 168 hours. No results for input(s): "AMMONIA" in the last 168 hours. Coagulation Profile: No results for input(s): "INR", "PROTIME" in the last 168 hours. Cardiac Enzymes: No results for input(s): "CKTOTAL", "CKMB", "CKMBINDEX", "TROPONINI" in the last 168 hours. BNP (last 3 results) No results for input(s): "PROBNP" in the last 8760 hours. HbA1C: No results for input(s): "HGBA1C" in the last 72 hours. CBG: No results for input(s): "GLUCAP" in the last 168 hours. Lipid Profile: No results for input(s): "CHOL", "HDL", "LDLCALC", "TRIG", "CHOLHDL", "LDLDIRECT" in the last 72 hours. Thyroid Function Tests: No results for input(s): "TSH", "T4TOTAL", "FREET4", "T3FREE", "THYROIDAB" in the last 72 hours. Anemia Panel: No results for input(s): "VITAMINB12", "FOLATE", "FERRITIN", "TIBC", "IRON", "RETICCTPCT" in the last 72 hours. Urine analysis:    Component Value Date/Time   COLORURINE YELLOW (A) 03/25/2023 1236   APPEARANCEUR HAZY (A) 03/25/2023 1236   APPEARANCEUR Clear 04/28/2014 2040   LABSPEC 1.027  03/25/2023 1236   LABSPEC 1.030 04/28/2014 2040   PHURINE 5.0 03/25/2023 1236   GLUCOSEU 150 (A) 03/25/2023 1236   GLUCOSEU Negative 04/28/2014 2040   HGBUR NEGATIVE 03/25/2023 1236   BILIRUBINUR NEGATIVE 03/25/2023 1236   BILIRUBINUR Negative 06/27/2022 1527   BILIRUBINUR Negative 04/28/2014 2040   KETONESUR NEGATIVE 03/25/2023 1236   PROTEINUR >=300 (A) 03/25/2023 1236   UROBILINOGEN 0.2 06/27/2022 1527   NITRITE NEGATIVE 03/25/2023 1236   LEUKOCYTESUR NEGATIVE 03/25/2023 1236   LEUKOCYTESUR Negative 04/28/2014 2040    Radiological Exams on Admission: DG Chest Port 1 View Result Date: 02/17/2024 CLINICAL DATA:  Shortness of breath and chest pain. EXAM: PORTABLE CHEST 1 VIEW COMPARISON:  AP Lat chest 01/21/2024 FINDINGS: Mild cardiomegaly. There is increased central vascular prominence. Mild increased interstitial consolidation consistent with interstitial edema. Small pleural effusions are beginning to form. No focal airspace infiltrate. Thoracic spondylosis. Again noted is a lower cervical/upper thoracic dorsal fusion construct, partially visible. IMPRESSION: Findings consistent with CHF. Progress chest films recommended depending on clinical response. Electronically Signed   By: Denman Fischer M.D.   On: 02/17/2024 05:19    EKG: Independently reviewed.  Sinus rhythm, no acute ST changes.  Assessment/Plan Principal Problem:   Acute respiratory failure with hypoxia (HCC) Active Problems:   Acute decompensated heart failure (HCC)   CHF (congestive heart failure) (HCC)  (please populate well all problems here in Problem List. (For example, if patient is on BP meds at home and you resume or decide to hold them, it is a problem that needs to be her. Same for CAD, COPD, HLD and so on)  Acute on chronic hypoxic respiratory failure Acute on chronic HFpEF decompensation - Continue IV Lasix  60 mg twice daily - Continue to wean off BiPAP - Continue CHF/BP medications including amlodipine   (considered to  be beneficial after recent left and right cardiac cath, by cardiology), bisoprolol , lisinopril  and spironolactone   COPD - Clinically I feel patient COPD has been stabilized with recent steroid treatment, and decided no escalation of COPD treatment at this point - Continue ICS and I will be-as needed DuoNebs  Cigarette smoking - Reeducation of cessation - Continue nicotine  patch  History of CAD -Recent cath showed stable LAD and OM1 disease -Continue aspirin  Repatha , bisoprolol  and Crestor   History of OSA Pulmonary hypertension -Not on at bedtime CPAP due to insurance issue  IIDM -SSI  Morbid obesity -Outpatient GLP-1 evaluation DVT prophylaxis: Lovenox  Code Status: Full code Family Communication: None at bedside Disposition Plan: Patient is sick with acute CHF dispensation requiring BiPAP and IV diuresis, expect more than 2 midnight hospital stay Consults called: None Admission status: PCU admit   [place] MD [facility] Pager [phone]  02/17/2024, 9:01 AM

## 2024-02-18 ENCOUNTER — Inpatient Hospital Stay

## 2024-02-18 ENCOUNTER — Other Ambulatory Visit: Payer: Self-pay

## 2024-02-18 DIAGNOSIS — E1142 Type 2 diabetes mellitus with diabetic polyneuropathy: Secondary | ICD-10-CM

## 2024-02-18 DIAGNOSIS — I509 Heart failure, unspecified: Secondary | ICD-10-CM | POA: Diagnosis not present

## 2024-02-18 DIAGNOSIS — I5033 Acute on chronic diastolic (congestive) heart failure: Secondary | ICD-10-CM

## 2024-02-18 DIAGNOSIS — R079 Chest pain, unspecified: Secondary | ICD-10-CM | POA: Diagnosis not present

## 2024-02-18 DIAGNOSIS — J811 Chronic pulmonary edema: Secondary | ICD-10-CM | POA: Diagnosis not present

## 2024-02-18 DIAGNOSIS — J9601 Acute respiratory failure with hypoxia: Secondary | ICD-10-CM | POA: Diagnosis not present

## 2024-02-18 DIAGNOSIS — E66813 Obesity, class 3: Secondary | ICD-10-CM | POA: Insufficient documentation

## 2024-02-18 LAB — BASIC METABOLIC PANEL WITH GFR
Anion gap: 11 (ref 5–15)
BUN: 40 mg/dL — ABNORMAL HIGH (ref 6–20)
CO2: 27 mmol/L (ref 22–32)
Calcium: 8.7 mg/dL — ABNORMAL LOW (ref 8.9–10.3)
Chloride: 99 mmol/L (ref 98–111)
Creatinine, Ser: 1.63 mg/dL — ABNORMAL HIGH (ref 0.61–1.24)
GFR, Estimated: 49 mL/min — ABNORMAL LOW (ref >60)
Glucose, Bld: 157 mg/dL — ABNORMAL HIGH (ref 70–99)
Potassium: 4.3 mmol/L (ref 3.5–5.1)
Sodium: 137 mmol/L (ref 135–145)

## 2024-02-18 LAB — GLUCOSE, CAPILLARY
Glucose-Capillary: 192 mg/dL — ABNORMAL HIGH (ref 70–99)
Glucose-Capillary: 200 mg/dL — ABNORMAL HIGH (ref 70–99)

## 2024-02-18 LAB — CBG MONITORING, ED
Glucose-Capillary: 167 mg/dL — ABNORMAL HIGH (ref 70–99)
Glucose-Capillary: 234 mg/dL — ABNORMAL HIGH (ref 70–99)

## 2024-02-18 MED ORDER — SODIUM CHLORIDE 0.9 % IV SOLN: 250.0000 mL | INTRAVENOUS | Status: AC | PRN

## 2024-02-18 MED ORDER — FUROSEMIDE 10 MG/ML IJ SOLN: 40.0000 mg | Freq: Every day | INTRAMUSCULAR | Status: DC

## 2024-02-18 NOTE — Plan of Care (Signed)
 Plan of care is reviewed. Pt is afebrile, on room air, normal respiratory effort, stable hemodynamically, sinus bradycardia on the monitor, HR 40s-50s. Pt is asymptomatic, able to ambulate in his room independently, no obvious acute distress noted. No major complaints. We will continue to monitor.   Problem: Clinical Measurements: Goal: Ability to maintain clinical measurements within normal limits will improve Outcome: Progressing Goal: Will remain free from infection Outcome: Progressing Goal: Diagnostic test results will improve Outcome: Progressing Goal: Respiratory complications will improve Outcome: Progressing Goal: Cardiovascular complication will be avoided Outcome: Progressing   Problem: Metabolic: Goal: Ability to maintain appropriate glucose levels will improve Outcome: Progressing   Problem: Tissue Perfusion: Goal: Adequacy of tissue perfusion will improve Outcome: Progressing   Brain Cahill, RN

## 2024-02-18 NOTE — Hospital Course (Addendum)
 Cory Rivas is a 58 y.o. male with medical history significant of CAD, chronic HFpEF, COPD Gold stage II, HTN, HLD, COVID, CKD stage II, presented with worsening of cough and shortness of breath. Chest x-ray showed pulmonary congestion, patient had significant respite distress, was placed on BiPAP with 35% of oxygen.  He is also given IV Lasix  and Solu-Medrol .

## 2024-02-18 NOTE — ED Notes (Addendum)
 MD notified of patient's HR dropping into 40s, MD to d/c beta blocker.

## 2024-02-18 NOTE — Progress Notes (Signed)
  Progress Note   Patient: Cory Rivas ZOX:[id number] DOB: April 18, 1966 DOA: 02/17/2024     1 DOS: the patient was seen and examined on 02/18/2024   Brief hospital course: EDUIN BRESLIN is a 58 y.o. male with medical history significant of CAD, chronic HFpEF, COPD Gold stage II, HTN, HLD, COVID, CKD stage II, presented with worsening of cough and shortness of breath. Chest x-ray showed pulmonary congestion, patient had significant respite distress, was placed on BiPAP with 35% of oxygen.  He is also given IV Lasix  and Solu-Medrol .   Principal Problem:   Acute respiratory failure with hypoxia (HCC) Active Problems:   Acute on chronic diastolic congestive heart failure (HCC)   Type 2 diabetes mellitus with peripheral neuropathy (HCC)   Essential hypertension   CHF (congestive heart failure) (HCC)   Class 3 obesity   Assessment and Plan: Acute on chronic hypoxic respiratory failure Acute on chronic HFpEF decompensation Elevated troponin secondary to demand ischemia from congestive heart failure. Recent heart cath 10/2023 showed moderate LAD and OM1 disease, did not require intervention. Patient had exacerbation of congestive heart failure with a worsening short of breath, chest x-ray showed vascular congestion with elevated BNP. Patient received IV Lasix , condition much improved today.  Will decrease Lasix  dose to once a day starting tomorrow. Patient may be able to go home tomorrow.   COPD Tobacco abuse. Does not have bronchospasm.  Continue home treatment.  But about 2.  Smoking.   CAD -Recent cath showed stable LAD and OM1 disease -Continue aspirin  Repatha , bisoprolol  and Crestor    OSA Pulmonary hypertension -Not on at bedtime CPAP due to insurance issue   Type 2 diabetes. -SSI   Class III obesity. Diet and excise.       Subjective:  Still has significant short of breath with exertion.  But overall feels better.  Physical Exam: Vitals:   02/18/24 1115 02/18/24  1120 02/18/24 1125 02/18/24 1130  BP: 117/67     Pulse: (!) 55 (!) 58 (!) 55 (!) 53  Resp: (!) 25 (!) 23 (!) 21 19  Temp:      TempSrc:      SpO2: 93% 93% 98% 98%  Weight:      Height:       General exam: Appears calm and comfortable  Respiratory system: Clear to auscultation. Respiratory effort normal. Cardiovascular system: S1 & S2 heard, RRR. No JVD, murmurs, rubs, gallops or clicks. No pedal edema. Gastrointestinal system: Abdomen is nondistended, soft and nontender. No organomegaly or masses felt. Normal bowel sounds heard. Central nervous system: Alert and oriented. No focal neurological deficits. Extremities: Symmetric 5 x 5 power. Skin: No rashes, lesions or ulcers Psychiatry: Judgement and insight appear normal. Mood & affect appropriate.    Data Reviewed:  Chest x-ray and lab results reviewed.  Family Communication: None  Disposition: Status is: Inpatient Remains inpatient appropriate because: Severity of disease, IV treatment.     Time spent: 35 minutes  Author: Donaciano Frizzle, MD 02/18/2024 11:35 AM  For on call review [email or web address].

## 2024-02-18 NOTE — Progress Notes (Signed)
 Heart Failure Stewardship Pharmacy Note  PCP: Adeline Hone, PA-C PCP-Cardiologist: Constancia Delton, MD  HPI: Cory Rivas is a 58 y.o. male with CAD, chronic HFpEF, COPD Gold stage II, HTN, HLD, gout, CKD stage II who presented with cough, shortness of breath, orthopnea, exertional dyspnea, chest tightness, and leg swelling. Patient has gained 7-8 pounds since previous discharge dry weight. On admission, BNP was 300.0 and HS-troponin was 27. Chest x-ray noted mild cardiomegaly and interstitial edema.   Pertinent cardiac history: Stress test in 09/2020 was low risk, LVEF was 59%. Echo 10/2020 with LVEF 60-65% and grade I diastolic dysfunction. Echo 08/2022 with LVEF 60-65%, mild LVH, grade II diastolic dysfunction. Echo 05/2023 with LVEF 50-55%, moderate LVH, grade III diastolic dysfunction. LHC 05/2023 noted moderate nonobstructive CAD. RHC 05/2023 showed RA 7, PA 45/16 (26), PCWP 18, CI 2.4, PVR 1.6 WU. LHC 10/2023 with similar nonobstructive CAD. RHC 10/2023 with PA 39/14 (22), PCWP 11, CI 2.5, PVR 2.1 WU.   Pertinent Lab Values: Creat  Date Value Ref Range Status  02/10/2024 1.30 0.70 - 1.30 mg/dL Final   Creatinine, Ser  Date Value Ref Range Status  02/18/2024 1.63 (H) 0.61 - 1.24 mg/dL Final   BUN  Date Value Ref Range Status  02/18/2024 40 (H) 6 - 20 mg/dL Final  16/07/9603 17 6 - 24 mg/dL Final   Potassium  Date Value Ref Range Status  02/18/2024 4.3 3.5 - 5.1 mmol/L Final   Sodium  Date Value Ref Range Status  02/18/2024 137 135 - 145 mmol/L Final  11/15/2023 142 134 - 144 mmol/L Final   Brain Natriuretic Peptide  Date Value Ref Range Status  07/01/2020 65 <100 pg/mL Final    Comment:    . BNP levels increase with age in the general population with the highest values seen in individuals greater than 82 years of age. Reference: J. Am. Rosetta Cons. Cardiol. 2002; 54:098-119. .    B Natriuretic Peptide  Date Value Ref Range Status  02/17/2024 300.0 (H) 0.0 - 100.0  pg/mL Final    Comment:    Performed at [facility], [address] [place], [place] [address]   Magnesium   Date Value Ref Range Status  10/24/2023 2.2 1.7 - 2.4 mg/dL Final    Comment:    Performed at [facility], [address] [place], [place] [address]   Hemoglobin A1C  Date Value Ref Range Status  11/28/2023 7.9 (A) 4.0 - 5.6 % Final  01/02/2022 6.5  Final   Hgb A1c MFr Bld  Date Value Ref Range Status  08/09/2023 9.8 (H) <5.7 % of total Hgb Final    Comment:    For someone without known diabetes, a hemoglobin A1c value of 6.5% or greater indicates that they may have  diabetes and this should be confirmed with a follow-up  test. . For someone with known diabetes, a value <7% indicates  that their diabetes is well controlled and a value  greater than or equal to 7% indicates suboptimal  control. A1c targets should be individualized based on  duration of diabetes, age, comorbid conditions, and  other considerations. . Currently, no consensus exists regarding use of hemoglobin A1c for diagnosis of diabetes for children. .    TSH  Date Value Ref Range Status  12/18/2017 1.86 0.41 - 5.90 Final    Vital Signs: Admission weight: 107.6 kg  Temp:  [97.8 F (36.6 C)-98.3 F (36.8 C)] 98.2 F (36.8 C) (05/06 0813) Pulse Rate:  [  55-68] 67 (05/06 0847) Cardiac Rhythm: Normal sinus rhythm (05/05 2254) Resp:  [18-21] 18 (05/06 0813) BP: (119-138)/(71-84) 137/77 (05/06 0813) SpO2:  [93 %-100 %] 93 % (05/06 0847)  Intake/Output Summary (Last 24 hours) at 02/18/2024 1030 Last data filed at 02/18/2024 0911 Gross per 24 hour  Intake --  Output 4125 ml  Net -4125 ml    Current Heart Failure Medications:  Loop diuretic: furosemide  40 mg IV daily Beta-Blocker: bisoprolol  10 mg daily ACEI/ARB/ARNI: lisinopril  40 mg daily  MRA: spironolactone  25 mg daily  SGLT2i: Jardiance  10 mg daily  Other: amlodipine  10 mg daily   Prior to admission Heart  Failure Medications:  Loop diuretic: torsemide  20 mg BID Beta-Blocker: bisoprolol  10 mg  ACEI/ARB/ARNI: lisinopril  40 mg daily  MRA: spironolactone  25 mg daily SGLT2i: Jardiance  10 mg daily  Other: amlodipine  10 mg daily    Assessment: 1. Acute on chronic diastolic heart failure (LVEF 50-55%) with grade III diastolic dysfunction, due to NICM. NYHA class II-III symptoms.  -Symptoms: Patient reports breathing is improving. No LEE on exam. Symptoms probably due to COPD and HF.  -Volume: Patient appears euvolemic, though difficult to assess due to body habitus. Creatine and BUN are trending up from yesterday. Furosemide  decreased from 60 mg IV BID to 40 mg IV daily today.  -Hemodynamics: BP has decreased today. HR 60s.  -BB: Continue bisoprolol  10 mg daily.   -ACEI/ARB/ARNI: Previously did not tolerate Entresto . Continue lisinopril  40 mg daily.  -MRA: Continue spironolactone  25 mg daily.  -SGLT2i: Continue Jardiance  10 mg daily.    Plan: 1) Medication changes recommended at this time: Agree with reduction in diuretic dose. May require further de-escalation to oral diuretics tomorrow.   2) Patient assistance: None  3) Education: -To be completed prior to discharge. - Patient has been educated on current HF medications and potential additions to HF medication regimen - Patient verbalizes understanding that over the next few months, these medication doses may change and more medications may be added to optimize HF regimen - Patient has been educated on basic disease state pathophysiology and goals of therapy   Medication Assistance / Insurance Benefits Check: Does the patient have prescription insurance?  Medicaid   Outpatient Pharmacy: Prior to admission outpatient pharmacy: [facility]    Galvin Jules, Student Pharmacist

## 2024-02-19 ENCOUNTER — Ambulatory Visit: Admitting: "Endocrinology

## 2024-02-19 ENCOUNTER — Other Ambulatory Visit: Payer: Self-pay

## 2024-02-19 ENCOUNTER — Encounter: Payer: Self-pay | Admitting: "Endocrinology

## 2024-02-19 DIAGNOSIS — J9601 Acute respiratory failure with hypoxia: Secondary | ICD-10-CM | POA: Diagnosis not present

## 2024-02-19 LAB — BASIC METABOLIC PANEL WITH GFR
Anion gap: 10 (ref 5–15)
BUN: 47 mg/dL — ABNORMAL HIGH (ref 6–20)
CO2: 28 mmol/L (ref 22–32)
Calcium: 8.8 mg/dL — ABNORMAL LOW (ref 8.9–10.3)
Chloride: 99 mmol/L (ref 98–111)
Creatinine, Ser: 1.87 mg/dL — ABNORMAL HIGH (ref 0.61–1.24)
GFR, Estimated: 41 mL/min — ABNORMAL LOW (ref >60)
Glucose, Bld: 143 mg/dL — ABNORMAL HIGH (ref 70–99)
Potassium: 4.2 mmol/L (ref 3.5–5.1)
Sodium: 137 mmol/L (ref 135–145)

## 2024-02-19 LAB — GLUCOSE, CAPILLARY
Glucose-Capillary: 168 mg/dL — ABNORMAL HIGH (ref 70–99)
Glucose-Capillary: 148 mg/dL — ABNORMAL HIGH (ref 70–99)
Glucose-Capillary: 139 mg/dL — ABNORMAL HIGH (ref 70–99)
Glucose-Capillary: 91 mg/dL (ref 70–99)

## 2024-02-19 LAB — MAGNESIUM: Magnesium: 2.5 mg/dL — ABNORMAL HIGH (ref 1.7–2.4)

## 2024-02-19 IMAGING — DX DG CHEST 1V PORT
1 series · 1 of 1 positions shown · non-contrast
Comparison: Chest x-ray 03/29/2022.

CLINICAL DATA: 56-year-old male with history of shortness of
breath.

EXAM:
PORTABLE CHEST 1 VIEW

[chest ap]
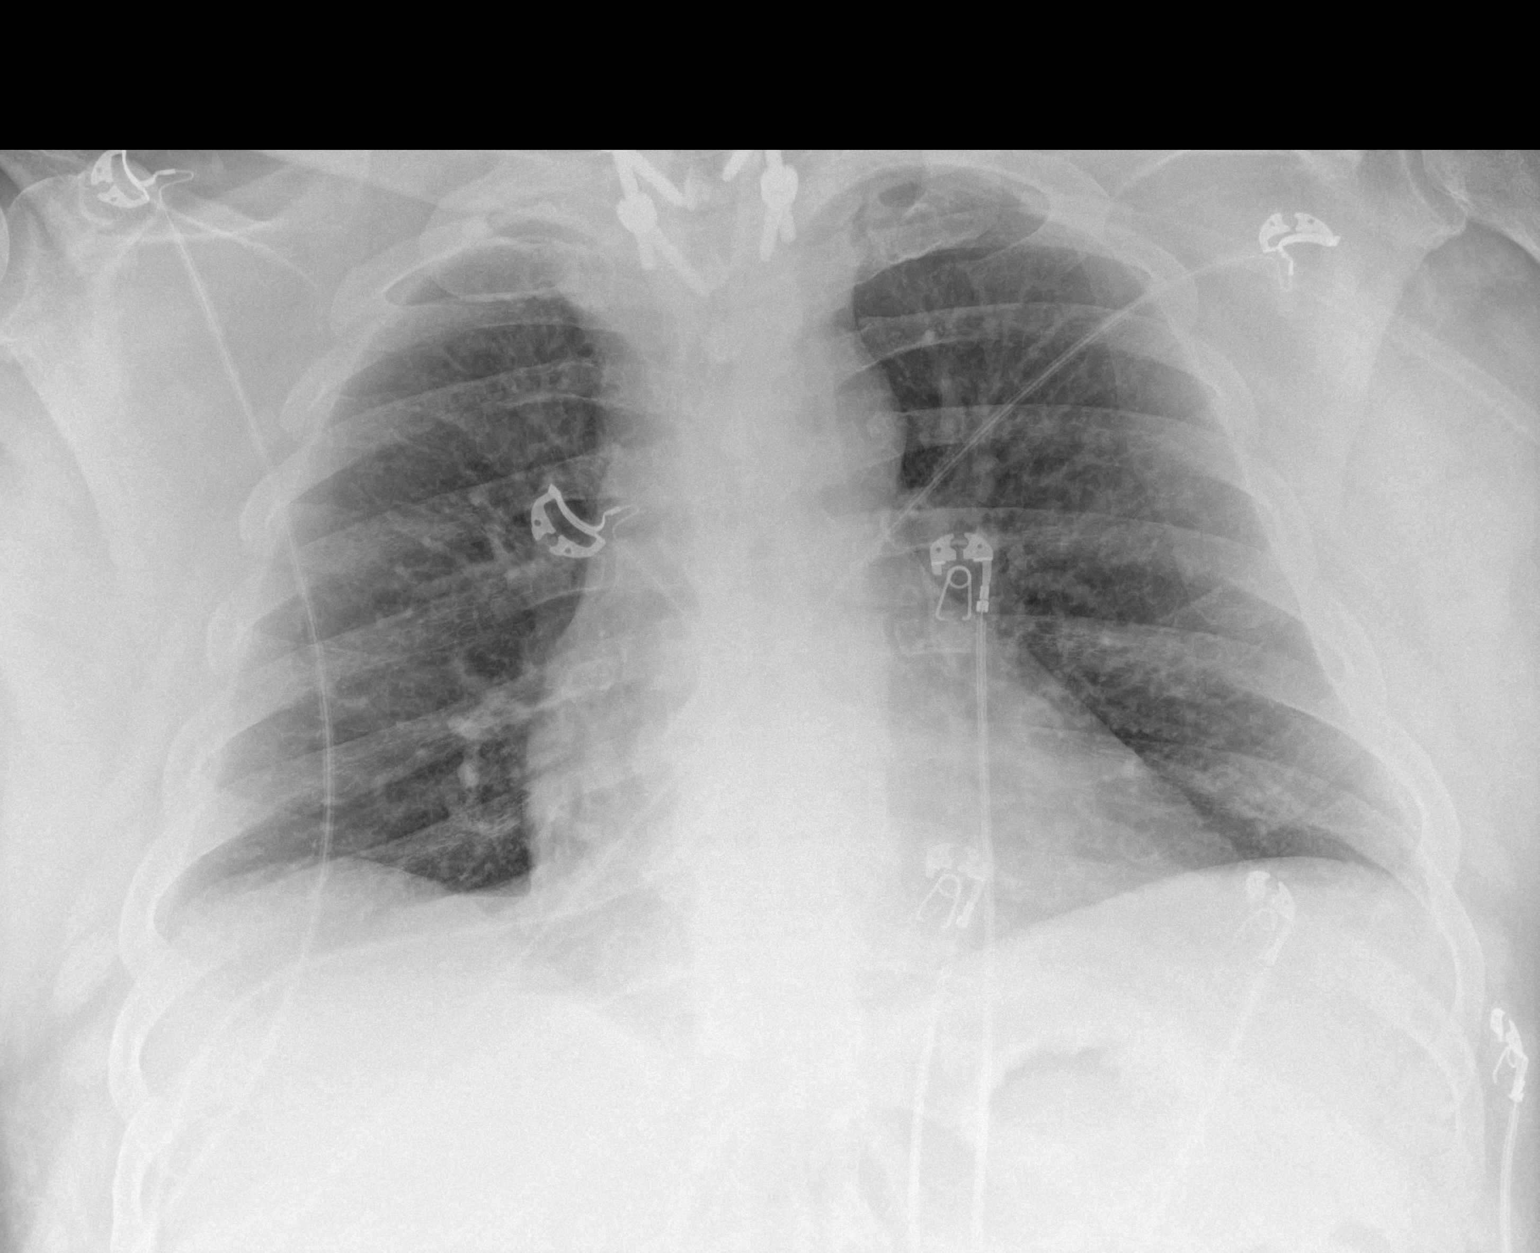

[1 of 1 positions shown; findings below may reference images not displayed]

FINDINGS: Lung volumes are low. No consolidative airspace disease. No pleural
effusions. No pneumothorax. No pulmonary nodule or mass noted.
Pulmonary vasculature and the cardiomediastinal silhouette are
within normal limits. Orthopedic fixation hardware in the lower
cervical spine incidentally noted.
IMPRESSION: 1. Low lung volumes without radiographic evidence of acute
cardiopulmonary disease.

## 2024-02-19 MED ORDER — SODIUM CHLORIDE 0.9 % IV SOLN: INTRAVENOUS | Status: AC

## 2024-02-19 NOTE — TOC Initial Note (Addendum)
 Transition of Care [facility]) - Initial/Assessment Note    Patient Details  Name: Cory Rivas MRN: [id number] Date of Birth: Apr 15, 1966  [facility] of [facility] CM/SW Contact:    Gaige Sebo C Jasime Westergren, RN Phone Number: 02/19/2024, 3:41 PM  Clinical Narrative:   Risk Assessment completed   01/20/2024 by Crissie Dome. Suitor LSW          Admitted from:Home. Lives with his sister  NWG:NFAOZ Tapia  Pharmacy:[facility]  Current home health/prior home health/DME:None         Patient Goals and CMS Choice            Expected Discharge Plan and Services                                              Prior Living Arrangements/Services                       Activities of Daily Living   ADL Screening (condition at time of admission) Independently performs ADLs?: Yes (appropriate for developmental age) Is the patient deaf or have difficulty hearing?: No Does the patient have difficulty seeing, even when wearing glasses/contacts?: No Does the patient have difficulty concentrating, remembering, or making decisions?: No  Permission Sought/Granted                  Emotional Assessment              Admission diagnosis:  CHF (congestive heart failure) (HCC) [I50.9] Respiratory distress [R06.03] Acute exacerbation of chronic obstructive pulmonary disease (COPD) (HCC) [J44.1] Acute respiratory failure with hypoxia (HCC) [J96.01] Encounter for smoking cessation counseling [Z71.6] Acute on chronic congestive heart failure, unspecified heart failure type (HCC) [I50.9] Patient Active Problem List   Diagnosis Date Noted   Class 3 obesity 02/18/2024   Nicotine  dependence 01/23/2024   Acute pulmonary edema (HCC) 01/22/2024   CHF (congestive heart failure) (HCC) 01/20/2024   COPD exacerbation (HCC) 10/21/2023   History of colon polyps 09/25/2023   Myocardial injury 08/19/2023   Type II diabetes mellitus with renal manifestations (HCC) 08/19/2023   Obesity (BMI 30-39.9)  08/19/2023   HLD (hyperlipidemia) 08/19/2023   Chronic diastolic CHF (congestive heart failure) (HCC) 08/19/2023   Diarrhea 08/19/2023   CAD (coronary artery disease) 08/19/2023   CKD (chronic kidney disease) stage 2, GFR 60-89 ml/min 08/19/2023   Acute on chronic diastolic heart failure (HCC) 05/30/2023   Unstable angina (HCC) 05/29/2023   Pleuritic chest pain 05/27/2023   Type 2 diabetes mellitus with peripheral neuropathy (HCC) 05/27/2023   Gastroesophageal reflux disease without esophagitis 05/27/2023   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   Dyslipidemia 05/27/2023   Hypertensive emergency 11/22/2022   Acute respiratory failure with hypoxia (HCC) 11/22/2022   Elevated troponin 11/22/2022   Acute on chronic heart failure with preserved ejection fraction (HFpEF) (HCC) 08/24/2022   Acute on chronic diastolic congestive heart failure (HCC) 08/23/2022   Class 2 obesity 08/23/2022   Uncontrolled diabetes mellitus with hyperglycemia, with long-term current use of insulin  (HCC) 08/23/2022   Grade I diastolic dysfunction 06/08/2022   LVH (left ventricular hypertrophy) 06/08/2022   Accelerated hypertension 06/08/2022   S/P cervical spinal fusion 08/18/2021   Former heavy tobacco smoker 08/18/2021   Gout 08/18/2021   Other chronic pain 10/26/2020   Systolic murmur 10/26/2020   Tobacco use 10/26/2020   Polyp of ascending  colon    Rectal polyp    Class 2 severe obesity with serious comorbidity and body mass index (BMI) of 39.0 to 39.9 in adult [facility]) 04/15/2019   Chronic kidney disease (CKD) stage G2/A3, mildly decreased glomerular filtration rate (GFR) between 60-89 mL/min/1.73 square meter and albuminuria creatinine ratio greater than 300 mg/g 04/15/2019   Proteinuria 04/15/2019   Hyperlipidemia associated with type 2 diabetes mellitus (HCC) 03/18/2019   Diabetes mellitus (HCC) 03/18/2019   Idiopathic chronic gout of multiple sites without tophus 03/18/2019   Essential hypertension  03/18/2019   Dermatitis 03/18/2019   Lumbar degenerative disc disease 11/22/2015   Cervical myelopathy (HCC) 11/22/2015   Osteoarthritis of spine with radiculopathy, lumbar region 11/22/2015   Degenerative disc disease, cervical 11/22/2015   Osteoarthritis of spine with radiculopathy, cervical region 11/22/2015   PCP:  Adeline Hone, PA-C Pharmacy:   [facility] [address] [place] [address] Phone: [phone] Fax: [phone]     Social Drivers of Health (SDOH) Social History: SDOH Screenings   Food Insecurity: No Food Insecurity (02/17/2024)  Housing: Low Risk  (02/17/2024)  Transportation Needs: No Transportation Needs (02/17/2024)  Utilities: Not At Risk (02/17/2024)  Alcohol Screen: Low Risk  (12/06/2022)  Depression (PHQ2-9): Medium Risk (01/09/2024)  Financial Resource Strain: High Risk (01/21/2024)  Physical Activity: Insufficiently Active (12/06/2022)  Social Connections: Socially Isolated (02/17/2024)  Stress: No Stress Concern Present (12/06/2022)  Tobacco Use: High Risk (02/17/2024)   SDOH Interventions:     Readmission Risk Interventions    01/20/2024    2:31 PM  Readmission Risk Prevention Plan  Transportation Screening Complete  PCP or Specialist Appt within 3-5 Days Complete  HRI or Home Care Consult Complete  Social Work Consult for Recovery Care Planning/Counseling Complete  Palliative Care Screening Not Applicable  Medication Review [employer]) Complete

## 2024-02-19 NOTE — Progress Notes (Addendum)
 Heart Failure Stewardship Pharmacy Note  PCP: Adeline Hone, PA-C PCP-Cardiologist: Constancia Delton, MD  HPI: Cory Rivas is a 58 y.o. male with CAD, chronic HFpEF, COPD Gold stage II, HTN, HLD, gout, CKD stage II who presented with cough, shortness of breath, orthopnea, exertional dyspnea, chest tightness, and leg swelling. Patient has gained 7-8 pounds since previous discharge dry weight. On admission, BNP was 300.0 and HS-troponin was 27. Chest x-ray noted mild cardiomegaly and interstitial edema.   Pertinent cardiac history: Stress test in 09/2020 was low risk, LVEF was 59%. Echo 10/2020 with LVEF 60-65% and grade I diastolic dysfunction. Echo 08/2022 with LVEF 60-65%, mild LVH, grade II diastolic dysfunction. Echo 05/2023 with LVEF 50-55%, moderate LVH, grade III diastolic dysfunction. LHC 05/2023 noted moderate nonobstructive CAD. RHC 05/2023 showed RA 7, PA 45/16 (26), PCWP 18, CI 2.4, PVR 1.6 WU. LHC 10/2023 with similar nonobstructive CAD. RHC 10/2023 with PA 39/14 (22), PCWP 11, CI 2.5, PVR 2.1 WU.   Pertinent Lab Values: Creat  Date Value Ref Range Status  02/10/2024 1.30 0.70 - 1.30 mg/dL Final   Creatinine, Ser  Date Value Ref Range Status  02/19/2024 1.87 (H) 0.61 - 1.24 mg/dL Final   BUN  Date Value Ref Range Status  02/19/2024 47 (H) 6 - 20 mg/dL Final  44/12/4740 17 6 - 24 mg/dL Final   Potassium  Date Value Ref Range Status  02/19/2024 4.2 3.5 - 5.1 mmol/L Final   Sodium  Date Value Ref Range Status  02/19/2024 137 135 - 145 mmol/L Final  11/15/2023 142 134 - 144 mmol/L Final   Brain Natriuretic Peptide  Date Value Ref Range Status  07/01/2020 65 <100 pg/mL Final    Comment:    . BNP levels increase with age in the general population with the highest values seen in individuals greater than 15 years of age. Reference: J. Am. Rosetta Cons. Cardiol. 2002; 59:563-875. .    B Natriuretic Peptide  Date Value Ref Range Status  02/17/2024 300.0 (H) 0.0 - 100.0  pg/mL Final    Comment:    Performed at [facility], [address] [place], [place] [address]   Magnesium   Date Value Ref Range Status  02/19/2024 2.5 (H) 1.7 - 2.4 mg/dL Final    Comment:    Performed at [facility], [address] [place], [place] [address]   Hemoglobin A1C  Date Value Ref Range Status  11/28/2023 7.9 (A) 4.0 - 5.6 % Final  01/02/2022 6.5  Final   Hgb A1c MFr Bld  Date Value Ref Range Status  08/09/2023 9.8 (H) <5.7 % of total Hgb Final    Comment:    For someone without known diabetes, a hemoglobin A1c value of 6.5% or greater indicates that they may have  diabetes and this should be confirmed with a follow-up  test. . For someone with known diabetes, a value <7% indicates  that their diabetes is well controlled and a value  greater than or equal to 7% indicates suboptimal  control. A1c targets should be individualized based on  duration of diabetes, age, comorbid conditions, and  other considerations. . Currently, no consensus exists regarding use of hemoglobin A1c for diagnosis of diabetes for children. .    TSH  Date Value Ref Range Status  12/18/2017 1.86 0.41 - 5.90 Final   Vital Signs: Admission weight: 107.6 kg  Temp:  [97.8 F (36.6 C)-99.3 F (37.4 C)] 98.6 F (37 C) (05/07 0403) Pulse Rate:  [  46-71] 48 (05/07 0403) Cardiac Rhythm: Sinus bradycardia (05/07 0427) Resp:  [13-29] 17 (05/07 0630) BP: (115-137)/(66-79) 115/73 (05/07 0403) SpO2:  [89 %-100 %] 93 % (05/07 0403) Weight:  [99.3 kg (219 lb)] 99.3 kg (219 lb) (05/07 0403)  Intake/Output Summary (Last 24 hours) at 02/19/2024 0733 Last data filed at 02/19/2024 0400 Gross per 24 hour  Intake 500 ml  Output 3680 ml  Net -3180 ml   Current Heart Failure Medications:  Loop diuretic: none Beta-Blocker: none ACEI/ARB/ARNI: none MRA: none SGLT2i: Jardiance  10 mg daily  Other: amlodipine  10 mg daily   Prior to admission Heart Failure Medications:  Loop  diuretic: torsemide  20 mg BID Beta-Blocker: bisoprolol  10 mg  ACEI/ARB/ARNI: lisinopril  40 mg daily  MRA: spironolactone  25 mg daily SGLT2i: Jardiance  10 mg daily  Other: amlodipine  10 mg daily    Assessment: 1. Acute on chronic diastolic heart failure (LVEF 50-55%) with grade III diastolic dysfunction, due to NICM. NYHA class II-III symptoms.  -Symptoms: Patient reports breathing is improving. No LEE on exam.  -Volume: Patient appears euvolemic, though difficult to assess due to body habitus. Creatine and BUN are trending up from yesterday. Weight down 8 lbs since admission. Discussed with MD and furosemide  was held today. Can consider resuming when euvolemic and AKI resolves. -Hemodynamics: BP stable. HR 50s.  -BB: Bisoprolol  discontinued due to low HR. If HR improves would not add back at dose greater than 2.5 mg. -ACEI/ARB/ARNI: Previously did not tolerate Entresto . Lisinopril  held today by MD due to creatinine increase. Could consider hydralazine  to control BP if needed until creatinine improves.  -MRA: Spironolactone  held today by MD due to creatinine increase. Consider resuming when AKI improves. -SGLT2i: Continue Jardiance  10 mg daily.    Plan: 1) Medication changes recommended at this time: Agree with holding furosemide  today.   2) Patient assistance: None  3) Education: -To be completed prior to discharge. - Patient has been educated on current HF medications and potential additions to HF medication regimen - Patient verbalizes understanding that over the next few months, these medication doses may change and more medications may be added to optimize HF regimen - Patient has been educated on basic disease state pathophysiology and goals of therapy   Medication Assistance / Insurance Benefits Check: Does the patient have prescription insurance?  Medicaid   Outpatient Pharmacy: Prior to admission outpatient pharmacy: [facility]    Please do not hesitate to reach  out with questions or concerns,  Bevely Brush, PharmD, CPP, BCPS Heart Failure Pharmacist  Phone - [phone] 02/19/2024 3:34 PM

## 2024-02-19 NOTE — Progress Notes (Signed)
  PROGRESS NOTE    Cory Rivas  ZOX:[id number] DOB: [birth year] DOA: 02/17/2024 PCP: Adeline Hone, PA-C  [id number]  LOS: 2 days   Brief hospital course:   Assessment & Plan: Cory Rivas is a 58 y.o. male with medical history significant of CAD, chronic HFpEF, COPD Gold stage II, HTN, HLD, COVID, CKD stage II, presented with worsening of cough and shortness of breath. Chest x-ray showed pulmonary congestion, patient had significant respite distress, was placed on BiPAP with 35% of oxygen.  He is also given IV Lasix  and Solu-Medrol .   Acute on chronic hypoxic respiratory failure Acute on chronic HFpEF decompensation Elevated troponin secondary to demand ischemia from congestive heart failure. Recent heart cath 10/2023 showed moderate LAD and OM1 disease, did not require intervention. Patient had exacerbation of congestive heart failure with a worsening short of breath, chest x-ray showed vascular congestion with elevated BNP. Patient received IV Lasix , condition much improved.   --d/c IV lasix  due to Cr increase --hold aldactone   AKI --Cr up to 1.87 this morning --d/c IV lasix  today --hold aldactone  --hold Lisinopril  --MIVF@75  for 12 hours   COPD Tobacco abuse. --cont nicotine  patch   CAD -Recent cath showed stable LAD and OM1 disease -Continue aspirin  Repatha , bisoprolol  and Crestor    OSA Pulmonary hypertension -Not on at bedtime CPAP due to insurance issue   Type 2 diabetes. --ACHS and SSI   Class III obesity. Diet and excise.   DVT prophylaxis: Lovenox  SQ Code Status: Full code  Family Communication:  Level of care: Med-Surg Dispo:   The patient is from: home Anticipated d/c is to: home Anticipated d/c date is: tomorrow   Subjective and Interval History:  Pt reported feeling better.   Objective: Vitals:   02/19/24 0810 02/19/24 1147 02/19/24 1539 02/19/24 2003  BP: 134/78 127/82 115/68 119/70  Pulse: (!) 52 (!) 50 (!) 53 (!) 51  Resp:    16   Temp: 98.2 F (36.8 C) 98.2 F (36.8 C) 98.1 F (36.7 C) 97.6 F (36.4 C)  TempSrc:      SpO2: 94% 94% 94% 97%  Weight:      Height:        Intake/Output Summary (Last 24 hours) at 02/19/2024 2008 Last data filed at 02/19/2024 1700 Gross per 24 hour  Intake 740 ml  Output 1855 ml  Net -1115 ml   Filed Weights   02/17/24 0449 02/19/24 0403  Weight: 107.6 kg 99.3 kg    Examination:   Constitutional: NAD, AAOx3 HEENT: conjunctivae and lids normal, EOMI CV: No cyanosis.   RESP: normal respiratory effort, on RA Extremities: No effusions, edema in BLE SKIN: warm, dry Neuro: II - XII grossly intact.   Psych: Normal mood and affect.  Appropriate judgement and reason   Data Reviewed: I have personally reviewed labs and imaging studies  Time spent: 50 minutes  Garrison Kanner, MD [facility] If 7PM-7AM, please contact night-coverage 02/19/2024, 8:08 PM

## 2024-02-20 ENCOUNTER — Other Ambulatory Visit: Payer: Self-pay

## 2024-02-20 DIAGNOSIS — J9601 Acute respiratory failure with hypoxia: Secondary | ICD-10-CM | POA: Diagnosis not present

## 2024-02-20 LAB — BASIC METABOLIC PANEL WITH GFR
Anion gap: 12 (ref 5–15)
BUN: 38 mg/dL — ABNORMAL HIGH (ref 6–20)
CO2: 28 mmol/L (ref 22–32)
Calcium: 8.7 mg/dL — ABNORMAL LOW (ref 8.9–10.3)
Chloride: 100 mmol/L (ref 98–111)
Creatinine, Ser: 1.53 mg/dL — ABNORMAL HIGH (ref 0.61–1.24)
GFR, Estimated: 53 mL/min — ABNORMAL LOW (ref >60)
Glucose, Bld: 112 mg/dL — ABNORMAL HIGH (ref 70–99)
Potassium: 3.8 mmol/L (ref 3.5–5.1)
Sodium: 140 mmol/L (ref 135–145)

## 2024-02-20 LAB — GLUCOSE, CAPILLARY: Glucose-Capillary: 114 mg/dL — ABNORMAL HIGH (ref 70–99)

## 2024-02-20 LAB — CBC
HCT: 45.7 % (ref 39.0–52.0)
Hemoglobin: 14.6 g/dL (ref 13.0–17.0)
MCH: 28 pg (ref 26.0–34.0)
MCHC: 31.9 g/dL (ref 30.0–36.0)
MCV: 87.7 fL (ref 80.0–100.0)
Platelets: 208 10*3/uL (ref 150–400)
RBC: 5.21 MIL/uL (ref 4.22–5.81)
RDW: 13.3 % (ref 11.5–15.5)
WBC: 11.6 10*3/uL — ABNORMAL HIGH (ref 4.0–10.5)
nRBC: 0 % (ref 0.0–0.2)

## 2024-02-20 NOTE — Progress Notes (Addendum)
 Heart Failure Stewardship Pharmacy Note  PCP: Adeline Hone, PA-C PCP-Cardiologist: Constancia Delton, MD  HPI: Cory Rivas is a 58 y.o. male with CAD, chronic HFpEF, COPD Gold stage II, HTN, HLD, gout, CKD stage II who presented with cough, shortness of breath, orthopnea, exertional dyspnea, chest tightness, and leg swelling. Patient has gained 7-8 pounds since previous discharge dry weight. On admission, BNP was 300.0 and HS-troponin was 27. Chest x-ray noted mild cardiomegaly and interstitial edema.   Pertinent cardiac history: Stress test in 09/2020 was low risk, LVEF was 59%. Echo 10/2020 with LVEF 60-65% and grade I diastolic dysfunction. Echo 08/2022 with LVEF 60-65%, mild LVH, grade II diastolic dysfunction. Echo 05/2023 with LVEF 50-55%, moderate LVH, grade III diastolic dysfunction. LHC 05/2023 noted moderate nonobstructive CAD. RHC 05/2023 showed RA 7, PA 45/16 (26), PCWP 18, CI 2.4, PVR 1.6 WU. LHC 10/2023 with similar nonobstructive CAD. RHC 10/2023 with PA 39/14 (22), PCWP 11, CI 2.5, PVR 2.1 WU.   Pertinent Lab Values: Creat  Date Value Ref Range Status  02/10/2024 1.30 0.70 - 1.30 mg/dL Final   Creatinine, Ser  Date Value Ref Range Status  02/20/2024 1.53 (H) 0.61 - 1.24 mg/dL Final   BUN  Date Value Ref Range Status  02/20/2024 38 (H) 6 - 20 mg/dL Final  40/98/1191 17 6 - 24 mg/dL Final   Potassium  Date Value Ref Range Status  02/20/2024 3.8 3.5 - 5.1 mmol/L Final   Sodium  Date Value Ref Range Status  02/20/2024 140 135 - 145 mmol/L Final  11/15/2023 142 134 - 144 mmol/L Final   Brain Natriuretic Peptide  Date Value Ref Range Status  07/01/2020 66 <100 pg/mL Final    Comment:    . BNP levels increase with age in the general population with the highest values seen in individuals greater than 32 years of age. Reference: J. Am. Rosetta Cons. Cardiol. 2002; 47:829-562. .    B Natriuretic Peptide  Date Value Ref Range Status  02/17/2024 300.0 (H) 0.0 - 100.0  pg/mL Final    Comment:    Performed at [facility], [address] [place], [place] [address]   Magnesium   Date Value Ref Range Status  02/19/2024 2.5 (H) 1.7 - 2.4 mg/dL Final    Comment:    Performed at [facility], [address] [place], [place] [address]   Hemoglobin A1C  Date Value Ref Range Status  11/28/2023 7.9 (A) 4.0 - 5.6 % Final  01/02/2022 6.5  Final   Hgb A1c MFr Bld  Date Value Ref Range Status  08/09/2023 9.8 (H) <5.7 % of total Hgb Final    Comment:    For someone without known diabetes, a hemoglobin A1c value of 6.5% or greater indicates that they may have  diabetes and this should be confirmed with a follow-up  test. . For someone with known diabetes, a value <7% indicates  that their diabetes is well controlled and a value  greater than or equal to 7% indicates suboptimal  control. A1c targets should be individualized based on  duration of diabetes, age, comorbid conditions, and  other considerations. . Currently, no consensus exists regarding use of hemoglobin A1c for diagnosis of diabetes for children. .    TSH  Date Value Ref Range Status  12/18/2017 1.86 0.41 - 5.90 Final   Vital Signs: Admission weight: 107.6 kg  Temp:  [97.6 F (36.4 C)-98.4 F (36.9 C)] 98.4 F (36.9 C) (05/08 0349) Pulse Rate:  [  50-55] 55 (05/08 0349) Cardiac Rhythm: Sinus bradycardia (05/07 0800) Resp:  [16-18] 18 (05/08 0349) BP: (114-134)/(68-82) 114/81 (05/08 0349) SpO2:  [94 %-97 %] 96 % (05/08 0349) Weight:  [98 kg (216 lb 1.6 oz)] 98 kg (216 lb 1.6 oz) (05/08 0517)  Intake/Output Summary (Last 24 hours) at 02/20/2024 0720 Last data filed at 02/20/2024 0230 Gross per 24 hour  Intake 240 ml  Output 1525 ml  Net -1285 ml   Current Heart Failure Medications:  Loop diuretic: none Beta-Blocker: none ACEI/ARB/ARNI: none MRA: none SGLT2i: Jardiance  10 mg daily  Other: amlodipine  10 mg daily   Prior to admission Heart Failure  Medications:  Loop diuretic: torsemide  20 mg BID Beta-Blocker: bisoprolol  10 mg  ACEI/ARB/ARNI: lisinopril  40 mg daily  MRA: spironolactone  25 mg daily SGLT2i: Jardiance  10 mg daily  Other: amlodipine  10 mg daily    Assessment: 1. Acute on chronic diastolic heart failure (LVEF 50-55%) with grade III diastolic dysfunction, due to NICM. NYHA class II-III symptoms.  -Symptoms: Patient reports breathing is improving. No LEE on exam.  -Volume: Patient appears euvolemic, though difficult to assess due to body habitus. Creatinine and BUN are down from yesterday after holding diuretics. Discussed with MD and furosemide  will be held today. -Hemodynamics: BP stable. HR 50s.  -BB: Bisoprolol  discontinued due to low HR. If HR improves outpatient, would not add back at dose greater than 2.5 mg. Given HFpEF, would also be fine to permanently discontinue. -ACEI/ARB/ARNI: Patient reports having nausea with Entresto  previously. Is open to trying the medication again, could consider starting outpatient. The decision to start Entresto  vs resume lisinopril  will be done outpatient. -MRA: Can consider starting spironolactone  25 mg daily tomorrow.  -SGLT2i: Continue Jardiance  10 mg daily.    Plan: 1) Medication changes recommended at this time: -Discussed with MD, will start spironolactone  tomorrow. Follow-up scheduled for 02/25/24.  2) Patient assistance: -Entresto  copay is $4  3) Education: -To be completed prior to discharge. - Patient has been educated on current HF medications and potential additions to HF medication regimen - Patient verbalizes understanding that over the next few months, these medication doses may change and more medications may be added to optimize HF regimen - Patient has been educated on basic disease state pathophysiology and goals of therapy   Medication Assistance / Insurance Benefits Check: Does the patient have prescription insurance?  Medicaid   Outpatient Pharmacy: Prior  to admission outpatient pharmacy: [facility]    Please do not hesitate to reach out with questions or concerns,  Please do not hesitate to reach out with questions or concerns,  Bevely Brush, PharmD, CPP, BCPS Heart Failure Pharmacist  Phone - [phone] 02/20/2024 10:26 AM

## 2024-02-20 NOTE — Discharge Summary (Signed)
 Physician Discharge Summary   Cory Rivas  male DOB: 1966-05-15  ZOX:[id number]  PCP: Adeline Hone, PA-C  Admit date: 02/17/2024 Discharge date: 02/20/2024  Admitted From: home Disposition:  home CODE STATUS: Full code  Discharge Instructions     Diet - low sodium heart healthy   Complete by: As directed       Hospital Course:  For full details, please see H&P, progress notes, consult notes and ancillary notes.  Briefly,  Cory Rivas is a 58 y.o. male with medical history significant of CAD, chronic HFpEF, COPD Gold stage II, HTN, presented with worsening of cough and shortness of breath.  Chest x-ray showed pulmonary congestion, patient had significant respiratory distress, was placed on BiPAP with 35% of oxygen.     Acute on chronic hypoxic respiratory failure Acute on chronic HFpEF decompensation chest x-ray showed vascular congestion with elevated BNP. Patient received IV Lasix , condition much improved.  Pt was on room air prior to discharge.  --Pt received IV lasix  80 x1, 60 x2 then held due to AKI. --home torsemide  and Lisinopril  held at discharge pending outpatient cardio f/u due to AKI. --cont home aldactone  --hold home bisoprolol  due to low HR   AKI --Cr 1.42 on presentation, went up to 1.87 after diuresis.  Cr improved to 1.53 after gentle MIVF. --home torsemide  and Lisinopril  held at discharge pending outpatient cardio f/u due to AKI. --cont home aldactone    COPD --ruled out exacerbation. --pt received IV solumedrol 125 mg in the ED, however, was thought not to be in COPD exacerbation, so steroid was not continued. --cont home bronchodilators  Tobacco abuse. --cont nicotine  patch   Elevated troponin  --very mild, trop 20's, which appears to be pt's baseline.  CAD -Recent cath showed stable LAD and OM1 disease -Continue aspirin  Repatha , and Crestor    OSA Pulmonary hypertension -Not on at bedtime CPAP due to insurance issue   Type 2  diabetes. --cont home regimen as below   Class III obesity.   Discharge Diagnoses:  Principal Problem:   Acute respiratory failure with hypoxia (HCC) Active Problems:   Acute on chronic diastolic congestive heart failure (HCC)   Type 2 diabetes mellitus with peripheral neuropathy (HCC)   Essential hypertension   CHF (congestive heart failure) (HCC)   Class 3 obesity   30 Day Unplanned Readmission Risk Score    Flowsheet Row ED to Hosp-Admission (Current) from 02/17/2024 in [facility]  30 Day Unplanned Readmission Risk Score (%) 28.91 Filed at 02/20/2024 0801       This score is the patient's risk of an unplanned readmission within 30 days of being discharged (0 -100%). The score is based on dignosis, age, lab data, medications, orders, and past utilization.   Low:  0-14.9   Medium: 15-21.9   High: 22-29.9   Extreme: 30 and above         Discharge Instructions:  Allergies as of 02/20/2024       Reactions   Oxycodone  Nausea And Vomiting   Entresto  [sacubitril -valsartan ] Nausea Only, Other (See Comments)   Lightheaded and dizzy    Onion Nausea And Vomiting   Nitroglycerin  Itching, Rash        Medication List     PAUSE taking these medications    bisoprolol  10 MG tablet Wait to take this until your doctor or other care provider tells you to start again. Hold due to low heart rate. Commonly known as: ZEBETA  Take 1 tablet (10  mg total) by mouth daily.   lisinopril  40 MG tablet Wait to take this until your doctor or other care provider tells you to start again. Due to acute kidney injury. Commonly known as: ZESTRIL  Take 1 tablet (40 mg total) by mouth daily.   torsemide  20 MG tablet Wait to take this until your doctor or other care provider tells you to start again. Due to acute kidney injury. Commonly known as: DEMADEX  Take 20 mg by mouth 2 (two) times daily.       STOP taking these medications    Dexcom G7 Receiver [facility] 3 Reader Devi   FreeStyle Butler 3 Sensor Misc   predniSONE  20 MG tablet Commonly known as: DELTASONE        TAKE these medications    allopurinol  300 MG tablet Commonly known as: ZYLOPRIM  Take 1 tablet (300 mg total) by mouth daily with 100 mg tablet for a total of 400mg  daily for gout.   amLODipine  10 MG tablet Commonly known as: NORVASC  Take 1 tablet (10 mg total) by mouth daily.   aspirin  EC 81 MG tablet Take 1 tablet (81 mg total) by mouth daily. Swallow whole.   baclofen  10 MG tablet Commonly known as: LIORESAL  Take 1 tablet (10 mg total) by mouth 3 (three) times daily as needed for muscle spasms.   cromolyn  4 % ophthalmic solution Commonly known as: OPTICROM  Place 1 drop into both eyes 4 (four) times daily as needed.   DULoxetine  30 MG capsule Commonly known as: CYMBALTA  Take 2 capsules (60 mg total) by mouth every morning AND 1 capsule (30 mg total) every evening.   empagliflozin  10 MG Tabs tablet Commonly known as: Jardiance  Take 1 tablet (10 mg total) by mouth daily.   ezetimibe  10 MG tablet Commonly known as: ZETIA  Take 1 tablet (10 mg total) by mouth daily.   fluticasone  50 MCG/ACT nasal spray Commonly known as: FLONASE  Place 1 spray into both nostrils daily.   Gvoke HypoPen  1-Pack 1 MG/0.2ML Soaj Generic drug: Glucagon  Inject 1 mg into the skin as needed.   ipratropium-albuterol  0.5-2.5 (3) MG/3ML Soln Commonly known as: DUONEB Take 3 mLs by nebulization every 6 (six) hours as needed.   levocetirizine 5 MG tablet Commonly known as: XYZAL  Take 1 tablet (5 mg total) by mouth every evening.   nicotine  7 mg/24hr patch Commonly known as: NICODERM CQ  - dosed in mg/24 hr Place 1 patch (7 mg total) onto the skin daily.   nicotine  polacrilex 4 MG lozenge Commonly known as: Nicotine  Mini Take 1 lozenge (4 mg total) by mouth as needed.   pantoprazole  40 MG tablet Commonly known as: PROTONIX  Take 1 tablet (40 mg total) by mouth  daily.   pantoprazole  40 MG tablet Commonly known as: PROTONIX  Take 1 tablet (40 mg total) by mouth daily.   pregabalin  50 MG capsule Commonly known as: LYRICA  Take 1 capsule (50 mg total) by mouth 2 (two) times daily.   Repatha  140 MG/ML Sosy Generic drug: Evolocumab  Inject 140 mg into the skin every 14 (fourteen) days.   rosuvastatin  40 MG tablet Commonly known as: CRESTOR  Take 1 tablet (40 mg total) by mouth daily.   Spiriva  Respimat 2.5 MCG/ACT Aers Generic drug: Tiotropium Bromide  Monohydrate Inhale 2 puffs into the lungs daily.   spironolactone  25 MG tablet Commonly known as: ALDACTONE  Take 1 tablet (25 mg total) by mouth daily.   Symbicort  160-4.5 MCG/ACT inhaler Generic drug: budesonide -formoterol  Inhale 2 puffs into the  lungs 2 (two) times daily.   Trulicity  3 MG/0.5ML Soaj Generic drug: Dulaglutide  Inject 3 mg into the skin once a week.   Ventolin  HFA 108 (90 Base) MCG/ACT inhaler Generic drug: albuterol  INHALE TWO PUFFS INTO THE LUNGS EVERY SIX HOURS AS NEEDED FOR WHEEZING OR SHORTNESS OF BREATH         Follow-up Information     [facility]. Go on 02/25/2024.   Specialty: Cardiology Why: Hospital follow-up 02/25/24 @ 9:30 Please bring all medications to follow-up appointment [facility] [place], [address] [employer] Parking at the [employer] information: [address] [place]  [address] [phone]                Allergies  Allergen Reactions   Oxycodone  Nausea And Vomiting   Entresto  [Sacubitril -Valsartan ] Nausea Only and Other (See Comments)    Lightheaded and dizzy    Onion Nausea And Vomiting   Nitroglycerin  Itching and Rash     The results of significant diagnostics from this hospitalization (including imaging, microbiology, ancillary and laboratory) are listed below for reference.   Consultations:   Procedures/Studies: DG Chest 1  View Result Date: 02/18/2024 CLINICAL DATA:  History of CHF and chest pain. EXAM: CHEST  1 VIEW COMPARISON:  02/17/2024 FINDINGS: Stable cardiomediastinal contours. No significant pleural effusion. Interval resolution of interstitial edema pattern. No airspace opacities. Postoperative changes noted within the lower cervical spine and upper thoracic spine. IMPRESSION: Interval resolution of interstitial edema pattern. Electronically Signed   By: Kimberley Penman M.D.   On: 02/18/2024 07:40   DG Chest Port 1 View Result Date: 02/17/2024 CLINICAL DATA:  Shortness of breath and chest pain. EXAM: PORTABLE CHEST 1 VIEW COMPARISON:  AP Lat chest 01/21/2024 FINDINGS: Mild cardiomegaly. There is increased central vascular prominence. Mild increased interstitial consolidation consistent with interstitial edema. Small pleural effusions are beginning to form. No focal airspace infiltrate. Thoracic spondylosis. Again noted is a lower cervical/upper thoracic dorsal fusion construct, partially visible. IMPRESSION: Findings consistent with CHF. Progress chest films recommended depending on clinical response. Electronically Signed   By: Denman Fischer M.D.   On: 02/17/2024 05:19   DG Chest Portable 2 Views Result Date: 01/21/2024 CLINICAL DATA:  Shortness of breath.  CHF. EXAM: CHEST  2 VIEW PORTABLE COMPARISON:  Radiograph earlier today FINDINGS: Lower lung volumes from earlier today. Stable cardiomegaly and mediastinal contours. Increase in peribronchial thickening. Ill-defined opacity at the lung bases, favor atelectasis. Trace fluid in the fissures without significant sub pulmonic effusion. No pneumothorax. Cervicothoracic fusion hardware is partially included. IMPRESSION: 1. Lower lung volumes from earlier today. Increase in peribronchial thickening, may be pulmonary edema or bronchitis. 2. Ill-defined opacity at the lung bases, favor atelectasis. Electronically Signed   By: Chadwick Colonel M.D.   On: 01/21/2024 17:24       Labs: BNP (last 3 results) Recent Labs    01/15/24 0247 01/20/24 0600 02/17/24 0452  BNP 117.8* 152.3* 300.0*   Basic Metabolic Panel: Recent Labs  Lab 02/17/24 0453 02/18/24 0434 02/19/24 0332 02/20/24 0447  NA 141 137 137 140  K 3.7 4.3 4.2 3.8  CL 106 99 99 100  CO2 24 27 28 28   GLUCOSE 126* 157* 143* 112*  BUN 21* 40* 47* 38*  CREATININE 1.42* 1.63* 1.87* 1.53*  CALCIUM  8.7* 8.7* 8.8* 8.7*  MG  --   --  2.5*  --    Liver Function Tests: Recent Labs  Lab 02/17/24 0453  AST 21  ALT 26  ALKPHOS 54  BILITOT 0.8  PROT 6.8  ALBUMIN 3.9   No results for input(s): "LIPASE", "AMYLASE" in the last 168 hours. No results for input(s): "AMMONIA" in the last 168 hours. CBC: Recent Labs  Lab 02/17/24 0453 02/20/24 0447  WBC 12.7* 11.6*  NEUTROABS 8.2*  --   HGB 13.9 14.6  HCT 42.7 45.7  MCV 87.7 87.7  PLT 235 208   Cardiac Enzymes: No results for input(s): "CKTOTAL", "CKMB", "CKMBINDEX", "TROPONINI" in the last 168 hours. BNP: Invalid input(s): "POCBNP" CBG: Recent Labs  Lab 02/19/24 0811 02/19/24 1149 02/19/24 1609 02/19/24 2112 02/20/24 0756  GLUCAP 168* 148* 139* 91 114*   D-Dimer No results for input(s): "DDIMER" in the last 72 hours. Hgb A1c No results for input(s): "HGBA1C" in the last 72 hours. Lipid Profile No results for input(s): "CHOL", "HDL", "LDLCALC", "TRIG", "CHOLHDL", "LDLDIRECT" in the last 72 hours. Thyroid function studies No results for input(s): "TSH", "T4TOTAL", "T3FREE", "THYROIDAB" in the last 72 hours.  Invalid input(s): "FREET3" Anemia work up No results for input(s): "VITAMINB12", "FOLATE", "FERRITIN", "TIBC", "IRON", "RETICCTPCT" in the last 72 hours. Urinalysis    Component Value Date/Time   COLORURINE YELLOW (A) 03/25/2023 1236   APPEARANCEUR HAZY (A) 03/25/2023 1236   APPEARANCEUR Clear 04/28/2014 2040   LABSPEC 1.027 03/25/2023 1236   LABSPEC 1.030 04/28/2014 2040   PHURINE 5.0 03/25/2023 1236    GLUCOSEU 150 (A) 03/25/2023 1236   GLUCOSEU Negative 04/28/2014 2040   HGBUR NEGATIVE 03/25/2023 1236   BILIRUBINUR NEGATIVE 03/25/2023 1236   BILIRUBINUR Negative 06/27/2022 1527   BILIRUBINUR Negative 04/28/2014 2040   KETONESUR NEGATIVE 03/25/2023 1236   PROTEINUR >=300 (A) 03/25/2023 1236   UROBILINOGEN 0.2 06/27/2022 1527   NITRITE NEGATIVE 03/25/2023 1236   LEUKOCYTESUR NEGATIVE 03/25/2023 1236   LEUKOCYTESUR Negative 04/28/2014 2040   Sepsis Labs Recent Labs  Lab 02/17/24 0453 02/20/24 0447  WBC 12.7* 11.6*   Microbiology Recent Results (from the past 240 hours)  Resp panel by RT-PCR (RSV, Flu A&B, Covid) Anterior Nasal Swab     Status: None   Collection Time: 02/17/24  4:53 AM   Specimen: Anterior Nasal Swab  Result Value Ref Range Status   SARS Coronavirus 2 by RT PCR NEGATIVE NEGATIVE Final    Comment: (NOTE) SARS-CoV-2 target nucleic acids are NOT DETECTED.  The SARS-CoV-2 RNA is generally detectable in upper respiratory specimens during the acute phase of infection. The lowest concentration of SARS-CoV-2 viral copies this assay can detect is 138 copies/mL. A negative result does not preclude SARS-Cov-2 infection and should not be used as the sole basis for treatment or other patient management decisions. A negative result may occur with  improper specimen collection/handling, submission of specimen other than nasopharyngeal swab, presence of viral mutation(s) within the areas targeted by this assay, and inadequate number of viral copies(<138 copies/mL). A negative result must be combined with clinical observations, patient history, and epidemiological information. The expected result is Negative.  Fact Sheet for Patients:  BloggerCourse.com  Fact Sheet for Healthcare Providers:  SeriousBroker.it  This test is no t yet approved or cleared by the [place]  FDA and  has been authorized for detection and/or  diagnosis of SARS-CoV-2 by FDA under an Emergency Use Authorization (EUA). This EUA will remain  in effect (meaning this test can be used) for the duration of the COVID-19 declaration under Section 564(b)(1) of the Act, 21 U.S.C.section 360bbb-3(b)(1), unless the authorization  is terminated  or revoked sooner.       Influenza A by PCR NEGATIVE NEGATIVE Final   Influenza B by PCR NEGATIVE NEGATIVE Final    Comment: (NOTE) The Xpert Xpress SARS-CoV-2/FLU/RSV plus assay is intended as an aid in the diagnosis of influenza from Nasopharyngeal swab specimens and should not be used as a sole basis for treatment. Nasal washings and aspirates are unacceptable for Xpert Xpress SARS-CoV-2/FLU/RSV testing.  Fact Sheet for Patients: BloggerCourse.com  Fact Sheet for Healthcare Providers: SeriousBroker.it  This test is not yet approved or cleared by the [place]  FDA and has been authorized for detection and/or diagnosis of SARS-CoV-2 by FDA under an Emergency Use Authorization (EUA). This EUA will remain in effect (meaning this test can be used) for the duration of the COVID-19 declaration under Section 564(b)(1) of the Act, 21 U.S.C. section 360bbb-3(b)(1), unless the authorization is terminated or revoked.     Resp Syncytial Virus by PCR NEGATIVE NEGATIVE Final    Comment: (NOTE) Fact Sheet for Patients: BloggerCourse.com  Fact Sheet for Healthcare Providers: SeriousBroker.it  This test is not yet approved or cleared by the [place]  FDA and has been authorized for detection and/or diagnosis of SARS-CoV-2 by FDA under an Emergency Use Authorization (EUA). This EUA will remain in effect (meaning this test can be used) for the duration of the COVID-19 declaration under Section 564(b)(1) of the Act, 21 U.S.C. section 360bbb-3(b)(1), unless the authorization is terminated  or revoked.  Performed at [facility], [address] [place], [place] [address]      Total time spend on discharging this patient, including the last patient exam, discussing the hospital stay, instructions for ongoing care as it relates to all pertinent caregivers, as well as preparing the medical discharge records, prescriptions, and/or referrals as applicable, is 35 minutes.    Garrison Kanner, MD  [facility] 02/20/2024, 8:47 AM

## 2024-02-24 ENCOUNTER — Encounter: Payer: Self-pay | Admitting: Family Medicine

## 2024-02-24 NOTE — Assessment & Plan Note (Signed)
 Pt currently appears euvolemic He is following with cardiology and going to HF clinic They added back spironolactone  and asked us  to recheck labs

## 2024-02-24 NOTE — Assessment & Plan Note (Signed)
 Uncontrolled, per endo, reviewed recent labs, with steroids sugars expected to increase He has continued working on diet efforts

## 2024-02-25 ENCOUNTER — Other Ambulatory Visit
Admission: RE | Admit: 2024-02-25 | Discharge: 2024-02-25 | Disposition: A | Discharge status: Home / Self Care | Attending: Family Medicine | Admitting: Family Medicine

## 2024-02-25 ENCOUNTER — Other Ambulatory Visit: Payer: Self-pay

## 2024-02-25 ENCOUNTER — Ambulatory Visit: Payer: Self-pay | Admitting: Family Medicine

## 2024-02-25 ENCOUNTER — Ambulatory Visit: Payer: Medicaid Other | Admitting: Family Medicine

## 2024-02-25 ENCOUNTER — Encounter: Admitting: Family

## 2024-02-25 ENCOUNTER — Encounter: Payer: Self-pay | Admitting: Family Medicine

## 2024-02-25 VITALS — BP 160/90 | HR 71 | Resp 16 | Ht 64.0 in | Wt 216.0 lb

## 2024-02-25 DIAGNOSIS — N179 Acute kidney failure, unspecified: Secondary | ICD-10-CM | POA: Diagnosis not present

## 2024-02-25 DIAGNOSIS — Z09 Encounter for follow-up examination after completed treatment for conditions other than malignant neoplasm: Secondary | ICD-10-CM

## 2024-02-25 DIAGNOSIS — I5032 Chronic diastolic (congestive) heart failure: Secondary | ICD-10-CM | POA: Diagnosis not present

## 2024-02-25 LAB — COMPREHENSIVE METABOLIC PANEL WITH GFR
ALT: 21 U/L (ref 0–44)
AST: 17 U/L (ref 15–41)
Albumin: 3.8 g/dL (ref 3.5–5.0)
Alkaline Phosphatase: 54 U/L (ref 38–126)
Anion gap: 5 (ref 5–15)
BUN: 20 mg/dL (ref 6–20)
CO2: 26 mmol/L (ref 22–32)
Calcium: 9.1 mg/dL (ref 8.9–10.3)
Chloride: 108 mmol/L (ref 98–111)
Creatinine, Ser: 1.22 mg/dL (ref 0.61–1.24)
GFR, Estimated: >60 mL/min (ref >60)
Glucose, Bld: 190 mg/dL — ABNORMAL HIGH (ref 70–99)
Potassium: 4.2 mmol/L (ref 3.5–5.1)
Sodium: 139 mmol/L (ref 135–145)
Total Bilirubin: 0.4 mg/dL (ref 0.0–1.2)
Total Protein: 7.1 g/dL (ref 6.5–8.1)

## 2024-02-25 NOTE — Patient Instructions (Signed)
 Go to the hospital to get your labs done and I will try to contact cardiology with your labs and symptoms today and we will contact you with a plan about restarting meds or adjusting follow up (you may need to see cardiology this week).

## 2024-02-25 NOTE — Progress Notes (Signed)
 Name: TREYVAN PUGLIANO   MRN: [id number]    DOB: [birth year]   Date:02/25/2024       Progress Note  Chief Complaint  Patient presents with   Hospitalization Follow-up     Subjective:   TYLA KIFLE is a 58 y.o. male, presents to clinic for HFU, respiratory failure secondary to CHF   Hospital Course:  For full details, please see H&P, progress notes, consult notes and ancillary notes.  Briefly,  TRELYN CIABURRI is a 58 y.o. male with medical history significant of CAD, chronic HFpEF, COPD Gold stage II, HTN, presented with worsening of cough and shortness of breath.   Chest x-ray showed pulmonary congestion, patient had significant respiratory distress, was placed on BiPAP with 35% of oxygen.     Acute on chronic hypoxic respiratory failure Acute on chronic HFpEF decompensation chest x-ray showed vascular congestion with elevated BNP. Patient received IV Lasix , condition much improved.  Pt was on room air prior to discharge.  --Pt received IV lasix  80 x1, 60 x2 then held due to AKI. --home torsemide  and Lisinopril  held at discharge pending outpatient cardio f/u due to AKI. --cont home aldactone  --hold home bisoprolol  due to low HR   AKI --Cr 1.42 on presentation, went up to 1.87 after diuresis.  Cr improved to 1.53 after gentle MIVF. --home torsemide  and Lisinopril  held at discharge pending outpatient cardio f/u due to AKI. --cont home aldactone    COPD --ruled out exacerbation. --pt received IV solumedrol 125 mg in the ED, however, was thought not to be in COPD exacerbation, so steroid was not continued. --cont home bronchodilators   Tobacco abuse. --cont nicotine  patch   Elevated troponin  --very mild, trop 20's, which appears to be pt's baseline.   CAD -Recent cath showed stable LAD and OM1 disease -Continue aspirin  Repatha , and Crestor    OSA Pulmonary hypertension -Not on at bedtime CPAP due to insurance issue   Type 2 diabetes. --cont home regimen as  below   Class III obesity.     Discharge Diagnoses:  Principal Problem:   Acute respiratory failure with hypoxia (HCC) Active Problems:   Acute on chronic diastolic congestive heart failure (HCC)   Type 2 diabetes mellitus with peripheral neuropathy (HCC)   Essential hypertension   CHF (congestive heart failure) (HCC)   Class 3 obesity    Here pt is still feeling unwell, SOB, lightheaded with walking less than 50 yards BP is high 160/90 - holding lisinopril  and torsemide , with orthopnea, DOE His weight is same as when he d/c from hospital, no LE edema Not sleeping well Wt Readings from Last 5 Encounters:  02/25/24 216 lb (98 kg)  02/20/24 216 lb 1.6 oz (98 kg)  02/11/24 222 lb 6 oz (100.9 kg)  02/10/24 223 lb (101.2 kg)  01/29/24 219 lb (99.3 kg)   BMI Readings from Last 5 Encounters:  02/25/24 37.08 kg/m  02/20/24 37.09 kg/m  02/11/24 38.17 kg/m  02/10/24 43.55 kg/m  01/29/24 42.77 kg/m      Current Outpatient Medications:    allopurinol  (ZYLOPRIM ) 300 MG tablet, Take 1 tablet (300 mg total) by mouth daily with 100 mg tablet for a total of 400mg  daily for gout., Disp: 90 tablet, Rfl: 1   amLODipine  (NORVASC ) 10 MG tablet, Take 1 tablet (10 mg total) by mouth daily., Disp: 30 tablet, Rfl: 3   aspirin  EC 81 MG tablet, Take 1 tablet (81 mg total) by mouth daily. Swallow whole., Disp: 90 tablet, Rfl:  3   baclofen  (LIORESAL ) 10 MG tablet, Take 1 tablet (10 mg total) by mouth 3 (three) times daily as needed for muscle spasms., Disp: 90 tablet, Rfl: 3   [Paused] bisoprolol  (ZEBETA ) 10 MG tablet, Take 1 tablet (10 mg total) by mouth daily., Disp: 90 tablet, Rfl: 3   budesonide -formoterol  (SYMBICORT ) 160-4.5 MCG/ACT inhaler, Inhale 2 puffs into the lungs 2 (two) times daily., Disp: 10.2 g, Rfl: 12   cromolyn  (OPTICROM ) 4 % ophthalmic solution, Place 1 drop into both eyes 4 (four) times daily as needed., Disp: 10 mL, Rfl: 2   Dulaglutide  (TRULICITY ) 3 MG/0.5ML SOAJ, Inject 3  mg into the skin once a week., Disp: 2 mL, Rfl: 10   DULoxetine  (CYMBALTA ) 30 MG capsule, Take 2 capsules (60 mg total) by mouth every morning AND 1 capsule (30 mg total) every evening., Disp: 270 capsule, Rfl: 0   empagliflozin  (JARDIANCE ) 10 MG TABS tablet, Take 1 tablet (10 mg total) by mouth daily., Disp: 90 tablet, Rfl: 1   Evolocumab  (REPATHA ) 140 MG/ML SOSY, Inject 140 mg into the skin every 14 (fourteen) days., Disp: 2 mL, Rfl: 11   ezetimibe  (ZETIA ) 10 MG tablet, Take 1 tablet (10 mg total) by mouth daily., Disp: 90 tablet, Rfl: 0   fluticasone  (FLONASE ) 50 MCG/ACT nasal spray, Place 1 spray into both nostrils daily., Disp: 16 g, Rfl: 6   Glucagon  (GVOKE HYPOPEN  1-PACK) 1 MG/0.2ML SOAJ, Inject 1 mg into the skin as needed., Disp: 1.8 mL, Rfl: 0   ipratropium-albuterol  (DUONEB) 0.5-2.5 (3) MG/3ML SOLN, Take 3 mLs by nebulization every 6 (six) hours as needed., Disp: 180 mL, Rfl: 1   levocetirizine (XYZAL ) 5 MG tablet, Take 1 tablet (5 mg total) by mouth every evening., Disp: 90 tablet, Rfl: 1   [Paused] lisinopril  (ZESTRIL ) 40 MG tablet, Take 1 tablet (40 mg total) by mouth daily., Disp: 90 tablet, Rfl: 1   nicotine  (NICODERM CQ  - DOSED IN MG/24 HR) 7 mg/24hr patch, Place 1 patch (7 mg total) onto the skin daily., Disp: 360 patch, Rfl: 0   nicotine  polacrilex (NICOTINE  MINI) 4 MG lozenge, Take 1 lozenge (4 mg total) by mouth as needed., Disp: 100 tablet, Rfl: 0   pantoprazole  (PROTONIX ) 40 MG tablet, Take 1 tablet (40 mg total) by mouth daily., Disp: 90 tablet, Rfl: 1   pregabalin  (LYRICA ) 50 MG capsule, Take 1 capsule (50 mg total) by mouth 2 (two) times daily., Disp: 60 capsule, Rfl: 0   rosuvastatin  (CRESTOR ) 40 MG tablet, Take 1 tablet (40 mg total) by mouth daily., Disp: 90 tablet, Rfl: 0   spironolactone  (ALDACTONE ) 25 MG tablet, Take 1 tablet (25 mg total) by mouth daily., Disp: 90 tablet, Rfl: 3   Tiotropium Bromide  Monohydrate (SPIRIVA  RESPIMAT) 2.5 MCG/ACT AERS, Inhale 2 puffs into  the lungs daily., Disp: 4 g, Rfl: 11   [Paused] torsemide  (DEMADEX ) 20 MG tablet, Take 20 mg by mouth 2 (two) times daily., Disp: , Rfl:    VENTOLIN  HFA 108 (90 Base) MCG/ACT inhaler, INHALE TWO PUFFS INTO THE LUNGS EVERY SIX HOURS AS NEEDED FOR WHEEZING OR SHORTNESS OF BREATH, Disp: 18 each, Rfl: 2  Patient Active Problem List   Diagnosis Date Noted   Class 3 obesity 02/18/2024   Nicotine  dependence 01/23/2024   Acute pulmonary edema (HCC) 01/22/2024   CHF (congestive heart failure) (HCC) 01/20/2024   COPD exacerbation (HCC) 10/21/2023   History of colon polyps 09/25/2023   Myocardial injury 08/19/2023   Type II diabetes mellitus  with renal manifestations (HCC) 08/19/2023   Obesity (BMI 30-39.9) 08/19/2023   HLD (hyperlipidemia) 08/19/2023   Chronic diastolic CHF (congestive heart failure) (HCC) 08/19/2023   Diarrhea 08/19/2023   CAD (coronary artery disease) 08/19/2023   CKD (chronic kidney disease) stage 2, GFR 60-89 ml/min 08/19/2023   Acute on chronic diastolic heart failure (HCC) 05/30/2023   Unstable angina (HCC) 05/29/2023   Pleuritic chest pain 05/27/2023   Type 2 diabetes mellitus with peripheral neuropathy (HCC) 05/27/2023   Gastroesophageal reflux disease without esophagitis 05/27/2023   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   Dyslipidemia 05/27/2023   Hypertensive emergency 11/22/2022   Acute respiratory failure with hypoxia (HCC) 11/22/2022   Elevated troponin 11/22/2022   Acute on chronic heart failure with preserved ejection fraction (HFpEF) (HCC) 08/24/2022   Acute on chronic diastolic congestive heart failure (HCC) 08/23/2022   Class 2 obesity 08/23/2022   Uncontrolled diabetes mellitus with hyperglycemia, with long-term current use of insulin  (HCC) 08/23/2022   Grade I diastolic dysfunction 06/08/2022   LVH (left ventricular hypertrophy) 06/08/2022   Accelerated hypertension 06/08/2022   S/P cervical spinal fusion 08/18/2021   Former heavy tobacco smoker  08/18/2021   Gout 08/18/2021   Other chronic pain 10/26/2020   Systolic murmur 10/26/2020   Tobacco use 10/26/2020   Polyp of ascending colon    Rectal polyp    Class 2 severe obesity with serious comorbidity and body mass index (BMI) of 39.0 to 39.9 in adult [facility]) 04/15/2019   Chronic kidney disease (CKD) stage G2/A3, mildly decreased glomerular filtration rate (GFR) between 60-89 mL/min/1.73 square meter and albuminuria creatinine ratio greater than 300 mg/g 04/15/2019   Proteinuria 04/15/2019   Hyperlipidemia associated with type 2 diabetes mellitus (HCC) 03/18/2019   Diabetes mellitus (HCC) 03/18/2019   Idiopathic chronic gout of multiple sites without tophus 03/18/2019   Essential hypertension 03/18/2019   Dermatitis 03/18/2019   Lumbar degenerative disc disease 11/22/2015   Cervical myelopathy (HCC) 11/22/2015   Osteoarthritis of spine with radiculopathy, lumbar region 11/22/2015   Degenerative disc disease, cervical 11/22/2015   Osteoarthritis of spine with radiculopathy, cervical region 11/22/2015    Past Surgical History:  Procedure Laterality Date   BACK SURGERY     CARDIAC CATHETERIZATION     CHOLECYSTECTOMY     COLONOSCOPY WITH PROPOFOL  N/A 03/10/2020   Procedure: COLONOSCOPY WITH PROPOFOL ;  Surgeon: Marnee Sink, MD;  Location: [facility] ENDOSCOPY;  Service: Endoscopy;  Laterality: N/A;   COLONOSCOPY WITH PROPOFOL  N/A 09/25/2023   Procedure: COLONOSCOPY WITH PROPOFOL ;  Surgeon: Marnee Sink, MD;  Location: [facility] ENDOSCOPY;  Service: Endoscopy;  Laterality: N/A;   CORONARY PRESSURE/FFR STUDY N/A 05/29/2023   Procedure: CORONARY PRESSURE/FFR STUDY;  Surgeon: Sammy Crisp, MD;  Location: [facility] INVASIVE CV LAB;  Service: Cardiovascular;  Laterality: N/A;   POLYPECTOMY  09/25/2023   Procedure: POLYPECTOMY;  Surgeon: Marnee Sink, MD;  Location: [facility] ENDOSCOPY;  Service: Endoscopy;;   RIGHT/LEFT HEART CATH AND CORONARY ANGIOGRAPHY N/A 05/29/2023   Procedure: RIGHT/LEFT HEART  CATH AND CORONARY ANGIOGRAPHY;  Surgeon: Sammy Crisp, MD;  Location: [facility] INVASIVE CV LAB;  Service: Cardiovascular;  Laterality: N/A;   RIGHT/LEFT HEART CATH AND CORONARY ANGIOGRAPHY Bilateral 10/30/2023   Procedure: RIGHT/LEFT HEART CATH AND CORONARY ANGIOGRAPHY;  Surgeon: Sammy Crisp, MD;  Location: [facility] INVASIVE CV LAB;  Service: Cardiovascular;  Laterality: Bilateral;    Family History  Problem Relation Age of Onset   Heart disease Mother    Diabetes Mother    Hyperlipidemia Mother    Hypertension  Mother    Heart attack Mother    Lung cancer Father    Hypertension Father    Stroke Father    AAA (abdominal aortic aneurysm) Maternal Grandmother    Heart attack Maternal Grandmother    Diabetes Maternal Grandfather     Social History   Tobacco Use   Smoking status: Some Days    Current packs/day: 0.00    Average packs/day: 1 pack/day for 37.0 years (37.0 ttl pk-yrs)    Types: Cigarettes    Start date: 10/19/1989    Last attempt to quit: 08/20/2022    Years since quitting: 1.5   Smokeless tobacco: Never   Tobacco comments:    30+ years hx smoking 2 ppd, stopped smoking briefly in April 2022 for a surgery and then restarted smoking about 1/2 ppd last 4-5 months         5-6cigs daily- 04/10/2023  Vaping Use   Vaping status: Never Used  Substance Use Topics   Alcohol use: No   Drug use: No     Allergies  Allergen Reactions   Oxycodone  Nausea And Vomiting   Entresto  [Sacubitril -Valsartan ] Nausea Only and Other (See Comments)    Lightheaded and dizzy    Onion Nausea And Vomiting   Nitroglycerin  Itching and Rash    Health Maintenance  Topic Date Due   COVID-19 Vaccine (4 - 2024-25 season) 06/16/2023   HEMOGLOBIN A1C  02/25/2024   Diabetic kidney evaluation - Urine ACR  03/20/2024   OPHTHALMOLOGY EXAM  03/04/2024   INFLUENZA VACCINE  05/15/2024   FOOT EXAM  08/27/2024   Lung Cancer Screening  12/28/2024   Diabetic kidney evaluation - eGFR measurement   02/19/2025   Colonoscopy  09/24/2028   DTaP/Tdap/Td (2 - Td or Tdap) 04/14/2029   Pneumococcal Vaccine 62-66 Years old  Completed   Hepatitis C Screening  Completed   HIV Screening  Completed   Zoster Vaccines- Shingrix   Completed   HPV VACCINES  Aged Out   Meningococcal B Vaccine  Aged Out    Chart Review Today: I personally reviewed active problem list, medication list, allergies, family history, social history, health maintenance, notes from last encounter, lab results, imaging with the patient/caregiver today.   Review of Systems  All other systems reviewed and are negative.    Objective:   Vitals:   02/25/24 0855 02/25/24 0911  BP: (!) 166/94 (!) 160/90  Pulse: 71   Resp: 16   SpO2: 95%   Weight: 216 lb (98 kg)   Height: 5\' 4"  (1.626 m)      02/25/24 0942  Resting  Supplemental oxygen during test? No  Resting Heart Rate 71  Resting Sp02 95  Lap 1 (250 feet)  HR 71  02 Sat 94  Lap 2 (250 feet)  HR 81  02 Sat 93  Lap 3 (250 feet)  HR 74  02 Sat 94  Tech Comments: SB - no increased WOB or distress noted     Body mass index is 37.08 kg/m.  Physical Exam Vitals and nursing note reviewed.  Constitutional:      General: He is not in acute distress.    Appearance: He is well-developed. He is obese. He is not ill-appearing, toxic-appearing or diaphoretic.  HENT:     Head: Normocephalic and atraumatic.     Nose: Nose normal.  Eyes:     General:        Right eye: No discharge.        Left  eye: No discharge.     Conjunctiva/sclera: Conjunctivae normal.  Neck:     Trachea: No tracheal deviation.  Cardiovascular:     Rate and Rhythm: Normal rate and regular rhythm.     Pulses: Normal pulses.     Heart sounds: Normal heart sounds.  Pulmonary:     Effort: Pulmonary effort is normal. No respiratory distress.     Breath sounds: No stridor.  Musculoskeletal:     Right lower leg: No edema.     Left lower leg: No edema.  Skin:    General: Skin is warm and  dry.     Findings: No rash.  Neurological:     Mental Status: He is alert.     Motor: No abnormal muscle tone.     Coordination: Coordination normal.  Psychiatric:        Mood and Affect: Mood normal.        Behavior: Behavior normal.      Functional Status Survey:   Results for orders placed or performed during the hospital encounter of 02/17/24  Brain natriuretic peptide   Collection Time: 02/17/24  4:52 AM  Result Value Ref Range   B Natriuretic Peptide 300.0 (H) 0.0 - 100.0 pg/mL  Resp panel by RT-PCR (RSV, Flu A&B, Covid) Anterior Nasal Swab   Collection Time: 02/17/24  4:53 AM   Specimen: Anterior Nasal Swab  Result Value Ref Range   SARS Coronavirus 2 by RT PCR NEGATIVE NEGATIVE   Influenza A by PCR NEGATIVE NEGATIVE   Influenza B by PCR NEGATIVE NEGATIVE   Resp Syncytial Virus by PCR NEGATIVE NEGATIVE  Comprehensive metabolic panel   Collection Time: 02/17/24  4:53 AM  Result Value Ref Range   Sodium 141 135 - 145 mmol/L   Potassium 3.7 3.5 - 5.1 mmol/L   Chloride 106 98 - 111 mmol/L   CO2 24 22 - 32 mmol/L   Glucose, Bld 126 (H) 70 - 99 mg/dL   BUN 21 (H) 6 - 20 mg/dL   Creatinine, Ser 1.61 (H) 0.61 - 1.24 mg/dL   Calcium  8.7 (L) 8.9 - 10.3 mg/dL   Total Protein 6.8 6.5 - 8.1 g/dL   Albumin 3.9 3.5 - 5.0 g/dL   AST 21 15 - 41 U/L   ALT 26 0 - 44 U/L   Alkaline Phosphatase 54 38 - 126 U/L   Total Bilirubin 0.8 0.0 - 1.2 mg/dL   GFR, Estimated 58 (L) >60 mL/min   Anion gap 11 5 - 15  CBC with Differential   Collection Time: 02/17/24  4:53 AM  Result Value Ref Range   WBC 12.7 (H) 4.0 - 10.5 K/uL   RBC 4.87 4.22 - 5.81 MIL/uL   Hemoglobin 13.9 13.0 - 17.0 g/dL   HCT 09.6 04.5 - 40.9 %   MCV 87.7 80.0 - 100.0 fL   MCH 28.5 26.0 - 34.0 pg   MCHC 32.6 30.0 - 36.0 g/dL   RDW 81.1 91.4 - 78.2 %   Platelets 235 150 - 400 K/uL   nRBC 0.0 0.0 - 0.2 %   Neutrophils Relative % 63 %   Neutro Abs 8.2 (H) 1.7 - 7.7 K/uL   Lymphocytes Relative 26 %   Lymphs Abs  3.3 0.7 - 4.0 K/uL   Monocytes Relative 7 %   Monocytes Absolute 0.8 0.1 - 1.0 K/uL   Eosinophils Relative 1 %   Eosinophils Absolute 0.1 0.0 - 0.5 K/uL   Basophils Relative 1 %  Basophils Absolute 0.1 0.0 - 0.1 K/uL   Immature Granulocytes 2 %   Abs Immature Granulocytes 0.20 (H) 0.00 - 0.07 K/uL  Blood gas, venous   Collection Time: 02/17/24  4:53 AM  Result Value Ref Range   pH, Ven 7.36 7.25 - 7.43   pCO2, Ven 45 44 - 60 mmHg   pO2, Ven 54 (H) 32 - 45 mmHg   Bicarbonate 25.4 20.0 - 28.0 mmol/L   Acid-base deficit 0.4 0.0 - 2.0 mmol/L   O2 Saturation 87.6 %   Patient temperature 37.0    Collection site VEIN   Troponin I (High Sensitivity)   Collection Time: 02/17/24  4:53 AM  Result Value Ref Range   Troponin I (High Sensitivity) 27 (H) <18 ng/L  Troponin I (High Sensitivity)   Collection Time: 02/17/24  7:07 AM  Result Value Ref Range   Troponin I (High Sensitivity) 21 (H) <18 ng/L  CBG monitoring, ED   Collection Time: 02/17/24 11:27 AM  Result Value Ref Range   Glucose-Capillary 325 (H) 70 - 99 mg/dL  CBG monitoring, ED   Collection Time: 02/17/24  4:24 PM  Result Value Ref Range   Glucose-Capillary 191 (H) 70 - 99 mg/dL  CBG monitoring, ED   Collection Time: 02/17/24  9:37 PM  Result Value Ref Range   Glucose-Capillary 233 (H) 70 - 99 mg/dL  Basic metabolic panel   Collection Time: 02/18/24  4:34 AM  Result Value Ref Range   Sodium 137 135 - 145 mmol/L   Potassium 4.3 3.5 - 5.1 mmol/L   Chloride 99 98 - 111 mmol/L   CO2 27 22 - 32 mmol/L   Glucose, Bld 157 (H) 70 - 99 mg/dL   BUN 40 (H) 6 - 20 mg/dL   Creatinine, Ser 1.61 (H) 0.61 - 1.24 mg/dL   Calcium  8.7 (L) 8.9 - 10.3 mg/dL   GFR, Estimated 49 (L) >60 mL/min   Anion gap 11 5 - 15  CBG monitoring, ED   Collection Time: 02/18/24  7:58 AM  Result Value Ref Range   Glucose-Capillary 167 (H) 70 - 99 mg/dL  CBG monitoring, ED   Collection Time: 02/18/24 11:12 AM  Result Value Ref Range    Glucose-Capillary 234 (H) 70 - 99 mg/dL  Glucose, capillary   Collection Time: 02/18/24  5:36 PM  Result Value Ref Range   Glucose-Capillary 192 (H) 70 - 99 mg/dL  Glucose, capillary   Collection Time: 02/18/24  9:23 PM  Result Value Ref Range   Glucose-Capillary 200 (H) 70 - 99 mg/dL  Basic metabolic panel   Collection Time: 02/19/24  3:32 AM  Result Value Ref Range   Sodium 137 135 - 145 mmol/L   Potassium 4.2 3.5 - 5.1 mmol/L   Chloride 99 98 - 111 mmol/L   CO2 28 22 - 32 mmol/L   Glucose, Bld 143 (H) 70 - 99 mg/dL   BUN 47 (H) 6 - 20 mg/dL   Creatinine, Ser 0.96 (H) 0.61 - 1.24 mg/dL   Calcium  8.8 (L) 8.9 - 10.3 mg/dL   GFR, Estimated 41 (L) >60 mL/min   Anion gap 10 5 - 15  Magnesium    Collection Time: 02/19/24  3:32 AM  Result Value Ref Range   Magnesium  2.5 (H) 1.7 - 2.4 mg/dL  Glucose, capillary   Collection Time: 02/19/24  8:11 AM  Result Value Ref Range   Glucose-Capillary 168 (H) 70 - 99 mg/dL  Glucose, capillary   Collection Time:  02/19/24 11:49 AM  Result Value Ref Range   Glucose-Capillary 148 (H) 70 - 99 mg/dL  Glucose, capillary   Collection Time: 02/19/24  4:09 PM  Result Value Ref Range   Glucose-Capillary 139 (H) 70 - 99 mg/dL  Glucose, capillary   Collection Time: 02/19/24  9:12 PM  Result Value Ref Range   Glucose-Capillary 91 70 - 99 mg/dL  Basic metabolic panel with GFR   Collection Time: 02/20/24  4:47 AM  Result Value Ref Range   Sodium 140 135 - 145 mmol/L   Potassium 3.8 3.5 - 5.1 mmol/L   Chloride 100 98 - 111 mmol/L   CO2 28 22 - 32 mmol/L   Glucose, Bld 112 (H) 70 - 99 mg/dL   BUN 38 (H) 6 - 20 mg/dL   Creatinine, Ser 8.29 (H) 0.61 - 1.24 mg/dL   Calcium  8.7 (L) 8.9 - 10.3 mg/dL   GFR, Estimated 53 (L) >60 mL/min   Anion gap 12 5 - 15  CBC   Collection Time: 02/20/24  4:47 AM  Result Value Ref Range   WBC 11.6 (H) 4.0 - 10.5 K/uL   RBC 5.21 4.22 - 5.81 MIL/uL   Hemoglobin 14.6 13.0 - 17.0 g/dL   HCT 56.2 13.0 - 86.5 %   MCV  87.7 80.0 - 100.0 fL   MCH 28.0 26.0 - 34.0 pg   MCHC 31.9 30.0 - 36.0 g/dL   RDW 78.4 69.6 - 29.5 %   Platelets 208 150 - 400 K/uL   nRBC 0.0 0.0 - 0.2 %  Glucose, capillary   Collection Time: 02/20/24  7:56 AM  Result Value Ref Range   Glucose-Capillary 114 (H) 70 - 99 mg/dL      Assessment & Plan:   1. Hospital discharge follow-up (Primary) Reviewed notes/labs f/up  - Comprehensive metabolic panel with GFR; Future  2. AKI (acute kidney injury) (HCC) Recheck renal function - currently holding diuretic and lisinopril   - Comprehensive metabolic panel with GFR; Future  3. Chronic diastolic CHF (congestive heart failure) (HCC) Still having DOE and orthopnea No weight increase or LE edema Ambulatory pulse ox showed he maintained SPO2 and heart rate remained normal walking around clinic today without any increased work of breathing but he does endorse dyspnea on exertion stating if he had to go from the end of our parking lot into clinic he would get lightheaded have chest discomfort and shortness of breath and have to stop and rest. Once I have lab results we will reach out to the heart failure clinic team to see if they can make recommendations on restarting blood pressure medicines or torsemide  because currently I believe he has to wait until next week for his cardiology follow-up appointment. - Comprehensive metabolic panel with GFR; Future     Adeline Hone, PA-C 02/25/24 9:25 AM

## 2024-03-02 ENCOUNTER — Telehealth: Payer: Self-pay | Admitting: Family

## 2024-03-02 ENCOUNTER — Other Ambulatory Visit: Payer: Self-pay

## 2024-03-02 NOTE — Progress Notes (Signed)
 [facility] Note    PCP: Adeline Hone, PA-C (last seen 05/25) Primary Cardiologist: Constancia Delton, MD/ Varney Gentleman, PA (last seen 04/25)  Chief Complaint: shortness of breath  HPI:   Cory Rivas is a 58 y/o male with a history of DM, hyperlipidemia, tracheobronchomalacia, nonobstructive CAD, HTN, gout, COPD, asthma, CKD, dyslipidemia, tobacco use and chronic heart failure.   Echo 11/04/20: EF 60-65% with Grade I DD, mild LVH/ LAE and normal PA pressure of 31.6 mmHg  Admitted 08/23/22 due to dyspnea, productive cough of brown sputum, mild sore throat, fatigue, dizziness and chest soreness. Steroid burst given. Initially given IV lasix  with transition to oral diuretics. Echo 08/23/22: EF of 60-65% along with mild LVH & mild Cory. Lisinopril  held due to AKI. Discharged after 2 days. Admitted 11/22/22 due to acute on chronic HF. Weaned off of bipap.Was in the ED 03/01/23 due to neck pain after falling off his tractor after he hit a stump. He landed on his right side.Head CT/ cervical spine xrays were negative. Was in the ED 03/09/23 due to atypical chest pain. Chest CTA negative for PE. Was in the ED 03/22/23 due to nonspecific chest pain. Workup negative. Was in the ED 03/23/23 due to hyperglycemia with glucose of >400.    Admitted 05/27/23 due to acute onset of chest pain, shortness of breath and diaphoresis. Blood pressure was significantly elevated to 204/102 on admission. Troponins were mildly elevated up to 26. Cardiology was consulted and he underwent cardiac catheterization 8/14 which revealed non-obstructive coronary artery disease. Meds adjusted. Chest x-ray without acute cardiopulmonary process. Echo on 05/28/2023 showed an EF of 50 to 55%, no regional wall motion abnormalities, moderate LVH, grade 3 diastolic dysfunction, elevated left atrial pressure, normal RV systolic function and ventricular cavity size, mildly dilated left atrium, mild mitral regurgitation, aortic valve sclerosis  without evidence of stenosis, and an estimated right atrial pressure of 3 mmHg.   Admitted 08/19/23 with chest pain/ pressure for 2 days located in the front chest. Worse with deep breath. Has shortness of breath and cough along with this. Negative D-dimer. Troponin 23 => 22. 1 dose of IV Lasix  as BNP 143.9. Glucose 409. Was in the ED 08/31/23 with central chest pressure along with shortness of breath & cough. Labs reassuring with normal troponin. Negative D-dimer. CXR concerning for pneumonia so antibiotics provided.   Admitted 10/21/23 due to acute hypoxic respiratory failure secondary to COPD exacerbation and acute on chronic HFpEF. Found to have worsening CKD. High-sensitivity troponin 21 with a delta troponin of 20 subsequently trended to 19. BNP 331. D-dimer negative. Supplemental oxygen given. Initially given IV lasix  with transition to increased home torsemide . Lisinopril , spironolactone , and torsemide  were held due to AKI.    R/LHC 10/30/23:  Moderate LAD and OM1 disease, not significantly different compared to prior catheterization in 05/2023.  RFR of sequential ostial and proximal LAD stenosis at that time was not hemodynamically significant. Normal left and right heart filling pressures. Mild pulmonary hypertension. Normal Fick cardiac output/index.        Echo 11/12/23: EF 55-60%, normal RV, mild Cory  Admitted 01/20/24 with worsening cough, wheezing & shortness of breath. Gained 10 pounds in the last month.IV diuresed. Chest x-ray showed pulmonary congestion as well as chronic interstitial changes compatible with COPD. Meds changed.   Admitted 02/17/24 with cough, shortness of breath, orthopnea, exertional dyspnea, chest tightness, and leg swelling. Patient has gained 7-8 pounds since previous discharge dry weight. On admission, BNP was 300.0  and HS-troponin was 27. Chest x-ray noted mild cardiomegaly and interstitial edema. Placed on bipap, given IV lasix . Torsemide , lisinopril  and bisoprolol  all  held  He presents today for a HF follow-up visit with a chief complaint of shortness of breath. Has associated fatigue, chest tightness, abdominal distention and pedal edema along with this. Feels like his SOB is worse when he lays down.  Says that he had some medications held at recent discharge but PCP recently resumed lisinopril  Continues to get his medications pill packed but didn't bring the packs and has no idea of what he's taking.   ROS: All systems negative except as listed in HPI, PMH and Problem List.  SH:  Social History   Socioeconomic History   Marital status: Divorced    Spouse name: Not on file   Number of children: 0   Years of education: 9   Highest education level: 9th grade  Occupational History   Occupation: disability  Tobacco Use   Smoking status: Some Days    Current packs/day: 0.00    Average packs/day: 1 pack/day for 37.0 years (37.0 ttl pk-yrs)    Types: Cigarettes    Start date: 10/19/1989    Last attempt to quit: 08/20/2022    Years since quitting: 1.5   Smokeless tobacco: Never   Tobacco comments:    30+ years hx smoking 2 ppd, stopped smoking briefly in April 2022 for a surgery and then restarted smoking about 1/2 ppd last 4-5 months         5-6cigs daily- 04/10/2023  Vaping Use   Vaping status: Never Used  Substance and Sexual Activity   Alcohol use: No   Drug use: No   Sexual activity: Not Currently    Partners: Female  Other Topics Concern   Not on file  Social History Narrative   Lives with sister Jenette Mitchell, brother in law Genella Kendall), and his mother - [place] Monds   Social Drivers of Health   Financial Resource Strain: High Risk (01/21/2024)   Overall Financial Resource Strain (CARDIA)    Difficulty of Paying Living Expenses: Hard  Food Insecurity: No Food Insecurity (02/17/2024)   Hunger Vital Sign    Worried About Running Out of Food in the Last Year: Never true    Ran Out of Food in the Last Year: Never true  Transportation Needs: No  Transportation Needs (02/17/2024)   PRAPARE - Administrator, Civil Service (Medical): No    Lack of Transportation (Non-Medical): No  Physical Activity: Insufficiently Active (12/06/2022)   Exercise Vital Sign    Days of Exercise per Week: 3 days    Minutes of Exercise per Session: 10 min  Stress: No Stress Concern Present (12/06/2022)   [employer] of Occupational Health - Occupational Stress Questionnaire    Feeling of Stress : Only a little  Social Connections: Socially Isolated (02/17/2024)   Social Connection and Isolation Panel [NHANES]    Frequency of Communication with Friends and Family: More than three times a week    Frequency of Social Gatherings with Friends and Family: Twice a week    Attends Religious Services: Never    Database administrator or Organizations: No    Attends Banker Meetings: Never    Marital Status: Divorced  Catering manager Violence: Not At Risk (02/17/2024)   Humiliation, Afraid, Rape, and Kick questionnaire    Fear of Current or Ex-Partner: No    Emotionally Abused: No    Physically Abused:  No    Sexually Abused: No    FH:  Family History  Problem Relation Age of Onset   Heart disease Mother    Diabetes Mother    Hyperlipidemia Mother    Hypertension Mother    Heart attack Mother    Lung cancer Father    Hypertension Father    Stroke Father    AAA (abdominal aortic aneurysm) Maternal Grandmother    Heart attack Maternal Grandmother    Diabetes Maternal Grandfather     Past Medical History:  Diagnosis Date   Bulging of cervical intervertebral disc    CHF (congestive heart failure) (HCC)    COPD (chronic obstructive pulmonary disease) (HCC)    Diabetes mellitus without complication (HCC)    Gout    High cholesterol    Hypertension    Stress due to family tension 04/15/2019    Current Outpatient Medications  Medication Sig Dispense Refill   allopurinol  (ZYLOPRIM ) 300 MG tablet Take 1 tablet (300 mg total)  by mouth daily with 100 mg tablet for a total of 400mg  daily for gout. 90 tablet 1   amLODipine  (NORVASC ) 10 MG tablet Take 1 tablet (10 mg total) by mouth daily. 30 tablet 3   aspirin  EC 81 MG tablet Take 1 tablet (81 mg total) by mouth daily. Swallow whole. 90 tablet 3   baclofen  (LIORESAL ) 10 MG tablet Take 1 tablet (10 mg total) by mouth 3 (three) times daily as needed for muscle spasms. 90 tablet 3   [Paused] bisoprolol  (ZEBETA ) 10 MG tablet Take 1 tablet (10 mg total) by mouth daily. 90 tablet 3   budesonide -formoterol  (SYMBICORT ) 160-4.5 MCG/ACT inhaler Inhale 2 puffs into the lungs 2 (two) times daily. 10.2 g 12   cromolyn  (OPTICROM ) 4 % ophthalmic solution Place 1 drop into both eyes 4 (four) times daily as needed. 10 mL 2   Dulaglutide  (TRULICITY ) 3 MG/0.5ML SOAJ Inject 3 mg into the skin once a week. 2 mL 10   DULoxetine  (CYMBALTA ) 30 MG capsule Take 2 capsules (60 mg total) by mouth every morning AND 1 capsule (30 mg total) every evening. 270 capsule 0   empagliflozin  (JARDIANCE ) 10 MG TABS tablet Take 1 tablet (10 mg total) by mouth daily. 90 tablet 1   Evolocumab  (REPATHA ) 140 MG/ML SOSY Inject 140 mg into the skin every 14 (fourteen) days. 2 mL 11   ezetimibe  (ZETIA ) 10 MG tablet Take 1 tablet (10 mg total) by mouth daily. 90 tablet 0   fluticasone  (FLONASE ) 50 MCG/ACT nasal spray Place 1 spray into both nostrils daily. 16 g 6   Glucagon  (GVOKE HYPOPEN  1-PACK) 1 MG/0.2ML SOAJ Inject 1 mg into the skin as needed. 1.8 mL 0   ipratropium-albuterol  (DUONEB) 0.5-2.5 (3) MG/3ML SOLN Take 3 mLs by nebulization every 6 (six) hours as needed. 180 mL 1   levocetirizine (XYZAL ) 5 MG tablet Take 1 tablet (5 mg total) by mouth every evening. 90 tablet 1   [Paused] lisinopril  (ZESTRIL ) 40 MG tablet Take 1 tablet (40 mg total) by mouth daily. 90 tablet 1   nicotine  (NICODERM CQ  - DOSED IN MG/24 HR) 7 mg/24hr patch Place 1 patch (7 mg total) onto the skin daily. 360 patch 0   nicotine  polacrilex  (NICOTINE  MINI) 4 MG lozenge Take 1 lozenge (4 mg total) by mouth as needed. 100 tablet 0   pantoprazole  (PROTONIX ) 40 MG tablet Take 1 tablet (40 mg total) by mouth daily. 90 tablet 1   pregabalin  (LYRICA ) 50  MG capsule Take 1 capsule (50 mg total) by mouth 2 (two) times daily. 60 capsule 0   rosuvastatin  (CRESTOR ) 40 MG tablet Take 1 tablet (40 mg total) by mouth daily. 90 tablet 0   spironolactone  (ALDACTONE ) 25 MG tablet Take 1 tablet (25 mg total) by mouth daily. 90 tablet 3   Tiotropium Bromide  Monohydrate (SPIRIVA  RESPIMAT) 2.5 MCG/ACT AERS Inhale 2 puffs into the lungs daily. 4 g 11   [Paused] torsemide  (DEMADEX ) 20 MG tablet Take 20 mg by mouth 2 (two) times daily.     VENTOLIN  HFA 108 (90 Base) MCG/ACT inhaler INHALE TWO PUFFS INTO THE LUNGS EVERY SIX HOURS AS NEEDED FOR WHEEZING OR SHORTNESS OF BREATH 18 each 2   No current facility-administered medications for this visit.   Vitals:   03/03/24 1154  BP: (!) 170/82  Pulse: 70  SpO2: 96%  Weight: 225 lb 3.2 oz (102.2 kg)   Wt Readings from Last 3 Encounters:  03/03/24 225 lb 3.2 oz (102.2 kg)  02/25/24 216 lb (98 kg)  02/20/24 216 lb 1.6 oz (98 kg)   Lab Results  Component Value Date   CREATININE 1.22 02/25/2024   CREATININE 1.53 (H) 02/20/2024   CREATININE 1.87 (H) 02/19/2024    PHYSICAL EXAM:  General: Well appearing. No resp difficulty HEENT: normal Neck: supple, JVD difficult to assess due to habitus Cor: Regular rhythm, rate. No rubs, gallops or murmurs Lungs: clear Abdomen: soft, nontender, distended. Extremities: no cyanosis, clubbing, rash, 2+ pitting edema bilateral lower legs up to knees Neuro: alert & oriented X 3. Moves all 4 extremities w/o difficulty. Affect pleasant    ECG: not done  ReDs reading: 36 %, normal    ASSESSMENT & PLAN:  1: NICM with preserved ejection fraction with LVH- - suscpect due to HTN and OSA - NYHA class III - euvolemic today - weighing daily; reminded to call for an  overnight weight gain of >2 pounds or a weekly weight gain of > 5 pounds - weight up 6 pounds from last visit here 1 month ago - ReDs today is 36% although his fluid retention is abdomen and lower legs - Echo 11/04/20: EF 60-65% with Grade I DD, mild LVH/ LAE and normal PA pressure of 31.6 mmHg - Echo 08/23/22: EF of 60-65% along with mild LVH & mild Cory. - Echo 05/28/23: EF 50-55% with moderate LVH, Grade III DD and mild Cory - Echo 11/12/23: EF 55-60%, normal RV, mild Cory - continue bisoprolol  5mg  daily - continue jardiance  10mg  every day - stop lisinopril  and begin valsartan  40mg  daily for better BP control; titrate up next visit or switch to entresto  - resume torsemide  but at 40mg  daily  - continue spironolactone  25mg  daily - BNP 02/17/24 was 300  2: HTN- - BP 170/82  - med changes per above - saw PCP Jefm Minium) 05/25  - BMP 02/25/24 reviewed: sodium 139, potassium 4.2, creatinine 1.22, GFR >60  - BMET next visit  3: DM- - A1c 11/28/23 was 7.9% - saw endocrinology (Rhinehart) 07/24 - 03/21/23 microalbumin/ creat ratio was 1338  4: Reactive airway disease- - saw pulmonology (Dgayli) 06/24 - using spiriva   - continues to not smoke - CPAP was returned as he wasn't wearing it long enough each night - pulmonary # given to him so he can get f/u appt scheduled; needs sleep apnea addressed or fluid retention will most likely continue  5: Hyperlipidemia- - continue ezetimibe  10mg  daily - continue repatha  every 2 weeks - continue  rosuvastatin  40mg  daily - lipid panel 11/15/23 showed LDL 101, triglycerides 209 (both improving)  6: CAD- - saw cardiology (Dunn) 04/25 - continue ezetimibe  10mg  daily - continue rosuvastatin  40mg  daily - [facility] 10/30/23:    Moderate LAD and OM1 disease, not significantly different compared to prior catheterization in 05/2023.  RFR of sequential ostial and proximal LAD stenosis at that time was not hemodynamically significant.   Normal left and right heart filling  pressures.   Mild pulmonary hypertension.   Normal Fick cardiac output/index.   Return in 2 weeks, sooner if needed.   Charlette Console, FNP 03/02/24

## 2024-03-02 NOTE — Telephone Encounter (Signed)
 Called to confirm/remind patient of their appointment at the [facility] on 03/03/24.   Appointment:   [] Confirmed  [x] Left mess   [] No answer/No voice mail  [] VM Full/unable to leave message  [] Phone not in service  Patient reminded to bring all medications and/or complete list.  Confirmed patient has transportation. Gave directions, instructed to utilize valet parking.

## 2024-03-03 ENCOUNTER — Ambulatory Visit: Attending: Family | Admitting: Family

## 2024-03-03 ENCOUNTER — Other Ambulatory Visit: Payer: Self-pay

## 2024-03-03 ENCOUNTER — Encounter: Payer: Self-pay | Admitting: Family

## 2024-03-03 ENCOUNTER — Encounter: Payer: Self-pay | Admitting: Family Medicine

## 2024-03-03 ENCOUNTER — Other Ambulatory Visit: Payer: Self-pay | Admitting: Family Medicine

## 2024-03-03 VITALS — BP 170/82 | HR 70 | Wt 225.2 lb

## 2024-03-03 DIAGNOSIS — I10 Essential (primary) hypertension: Secondary | ICD-10-CM

## 2024-03-03 DIAGNOSIS — F17210 Nicotine dependence, cigarettes, uncomplicated: Secondary | ICD-10-CM | POA: Diagnosis not present

## 2024-03-03 DIAGNOSIS — Z794 Long term (current) use of insulin: Secondary | ICD-10-CM | POA: Diagnosis not present

## 2024-03-03 DIAGNOSIS — E785 Hyperlipidemia, unspecified: Secondary | ICD-10-CM | POA: Diagnosis not present

## 2024-03-03 DIAGNOSIS — E119 Type 2 diabetes mellitus without complications: Secondary | ICD-10-CM

## 2024-03-03 DIAGNOSIS — I428 Other cardiomyopathies: Secondary | ICD-10-CM | POA: Insufficient documentation

## 2024-03-03 DIAGNOSIS — Z720 Tobacco use: Secondary | ICD-10-CM

## 2024-03-03 DIAGNOSIS — J45909 Unspecified asthma, uncomplicated: Secondary | ICD-10-CM

## 2024-03-03 DIAGNOSIS — I5032 Chronic diastolic (congestive) heart failure: Secondary | ICD-10-CM

## 2024-03-03 DIAGNOSIS — I130 Hypertensive heart and chronic kidney disease with heart failure and stage 1 through stage 4 chronic kidney disease, or unspecified chronic kidney disease: Secondary | ICD-10-CM | POA: Insufficient documentation

## 2024-03-03 DIAGNOSIS — I2510 Atherosclerotic heart disease of native coronary artery without angina pectoris: Secondary | ICD-10-CM | POA: Diagnosis not present

## 2024-03-03 DIAGNOSIS — G4733 Obstructive sleep apnea (adult) (pediatric): Secondary | ICD-10-CM | POA: Insufficient documentation

## 2024-03-03 DIAGNOSIS — I272 Pulmonary hypertension, unspecified: Secondary | ICD-10-CM | POA: Insufficient documentation

## 2024-03-03 DIAGNOSIS — Z7984 Long term (current) use of oral hypoglycemic drugs: Secondary | ICD-10-CM | POA: Diagnosis not present

## 2024-03-03 DIAGNOSIS — N189 Chronic kidney disease, unspecified: Secondary | ICD-10-CM | POA: Insufficient documentation

## 2024-03-03 DIAGNOSIS — Z5986 Financial insecurity: Secondary | ICD-10-CM | POA: Insufficient documentation

## 2024-03-03 DIAGNOSIS — E1122 Type 2 diabetes mellitus with diabetic chronic kidney disease: Secondary | ICD-10-CM | POA: Insufficient documentation

## 2024-03-03 DIAGNOSIS — E782 Mixed hyperlipidemia: Secondary | ICD-10-CM | POA: Diagnosis not present

## 2024-03-03 DIAGNOSIS — Z79899 Other long term (current) drug therapy: Secondary | ICD-10-CM | POA: Insufficient documentation

## 2024-03-03 DIAGNOSIS — R0602 Shortness of breath: Secondary | ICD-10-CM | POA: Diagnosis present

## 2024-03-03 DIAGNOSIS — J4489 Other specified chronic obstructive pulmonary disease: Secondary | ICD-10-CM | POA: Insufficient documentation

## 2024-03-03 DIAGNOSIS — Z7985 Long-term (current) use of injectable non-insulin antidiabetic drugs: Secondary | ICD-10-CM | POA: Insufficient documentation

## 2024-03-03 MED ORDER — TORSEMIDE 20 MG PO TABS
40.0000 mg | ORAL_TABLET | Freq: Every day | ORAL | 6 refills | Status: AC
  Filled 2024-03-03: qty 60, 30d supply, fill #0
  Filled 2024-04-09: qty 60, 30d supply, fill #1
  Filled 2024-05-29 – 2024-06-01 (×2): qty 60, 30d supply, fill #2
  Filled 2024-07-09 – 2024-07-14 (×4): qty 60, 30d supply, fill #3
  Filled 2024-08-25: qty 60, 30d supply, fill #4
  Filled 2024-09-25: qty 60, 30d supply, fill #5
  Filled 2024-11-09: qty 60, 30d supply, fill #6

## 2024-03-03 MED ORDER — PANTOPRAZOLE SODIUM 40 MG PO TBEC
40.0000 mg | DELAYED_RELEASE_TABLET | Freq: Every day | ORAL | 1 refills | Status: DC
  Filled 2024-03-03: qty 90, 90d supply, fill #0

## 2024-03-03 MED ORDER — VALSARTAN 40 MG PO TABS
40.0000 mg | ORAL_TABLET | Freq: Every day | ORAL | 6 refills | Status: DC
  Filled 2024-03-03: qty 30, 30d supply, fill #0

## 2024-03-03 NOTE — Progress Notes (Signed)
 ReDS Vest / Clip - 03/03/24 1200       ReDS Vest / Clip   Station Marker D    Ruler Value 37    ReDS Value Range Moderate volume overload    ReDS Actual Value 36

## 2024-03-03 NOTE — Patient Instructions (Signed)
 Medication Changes:  STOP Lisinopril   START Valsartan  40mg  (1 tab) daily  RESTART Torsemide  40mg  (2 tabs) daily  Special Instructions // Education:  Please reach out to [facility] at [phone]  Follow-Up in: Please follow up with the [facility] Clinic in 2 weeks with Shawnee Dellen, FNP.  At the [facility], you and your health needs are our priority. We have a designated team specialized in the treatment of Heart Failure. This Care Team includes your primary Heart Failure Specialized Cardiologist (physician), Advanced Practice Providers (APPs- Physician Assistants and Nurse Practitioners), and Pharmacist who all work together to provide you with the care you need, when you need it.   You may see any of the following providers on your designated Care Team at your next follow up:  Dr. Jules Oar Dr. Peder Bourdon Dr. Alwin Baars Dr. Judyth Nunnery Shawnee Dellen, FNP Bevely Brush, RPH-CPP  Please be sure to bring in all your medications bottles to every appointment.   Need to Contact Us :  If you have any questions or concerns before your next appointment please send us  a message through [place] or call our office at [phone].    TO LEAVE A MESSAGE FOR THE NURSE SELECT OPTION 2, PLEASE LEAVE A MESSAGE INCLUDING: YOUR NAME DATE OF BIRTH CALL BACK NUMBER REASON FOR CALL**this is important as we prioritize the call backs  YOU WILL RECEIVE A CALL BACK THE SAME DAY AS LONG AS YOU CALL BEFORE 4:00 PM

## 2024-03-04 ENCOUNTER — Other Ambulatory Visit: Payer: Self-pay | Admitting: Family Medicine

## 2024-03-04 ENCOUNTER — Telehealth: Payer: Self-pay

## 2024-03-04 ENCOUNTER — Other Ambulatory Visit: Payer: Self-pay

## 2024-03-04 ENCOUNTER — Other Ambulatory Visit (HOSPITAL_BASED_OUTPATIENT_CLINIC_OR_DEPARTMENT_OTHER): Payer: Self-pay

## 2024-03-04 MED ORDER — VENTOLIN HFA 108 (90 BASE) MCG/ACT IN AERS
2.0000 | INHALATION_SPRAY | Freq: Four times a day (QID) | RESPIRATORY_TRACT | 2 refills | Status: DC | PRN
  Filled 2024-03-04: qty 18, 25d supply, fill #0
  Filled 2024-03-04: qty 18, 30d supply, fill #0
  Filled 2024-05-28: qty 18, 25d supply, fill #1

## 2024-03-04 NOTE — Telephone Encounter (Signed)
 Refill sent.

## 2024-03-04 NOTE — Telephone Encounter (Signed)
 Refill request on  VENTOLIN  HFA 108 (90 Base) MCG/ACT inhaler

## 2024-03-05 ENCOUNTER — Other Ambulatory Visit: Payer: Self-pay | Admitting: Family Medicine

## 2024-03-05 ENCOUNTER — Other Ambulatory Visit: Payer: Self-pay

## 2024-03-06 ENCOUNTER — Other Ambulatory Visit: Payer: Self-pay

## 2024-03-06 MED ORDER — NICOTINE 7 MG/24HR TD PT24
7.0000 mg | MEDICATED_PATCH | Freq: Every day | TRANSDERMAL | 1 refills | Status: DC
  Filled 2024-03-06: qty 28, 28d supply, fill #0

## 2024-03-06 MED ORDER — NICOTINE POLACRILEX 4 MG MT LOZG
4.0000 mg | LOZENGE | OROMUCOSAL | 0 refills | Status: DC | PRN
  Filled 2024-03-06: qty 72, 7d supply, fill #0

## 2024-03-06 NOTE — Telephone Encounter (Signed)
 Requested medication (s) are due for refill today: yes  Requested medication (s) are on the active medication list: yes  Last refill:  02/17/24/ #100  Future visit scheduled: no  Notes to clinic:  last prescribed by Buell Carmin MD at hospital dc   Requested Prescriptions  Pending Prescriptions Disp Refills   nicotine  polacrilex (COMMIT) 4 MG lozenge [Pharmacy Med Name: nicotine  polacrilex (NICOTINE  MINI) 4 MG lozenge] 100 tablet 0    Sig: Take 1 lozenge (4 mg total) by mouth as needed.     Psychiatry:  Drug Dependence Therapy Passed - 03/06/2024 11:33 AM      Passed - Valid encounter within last 12 months    Recent Outpatient Visits           1 week ago Hospital discharge follow-up   [facility] Adeline Hone, PA-C   3 weeks ago Exacerbation of persistent asthma, unspecified asthma severity   [facility] [facility] Adeline Hone, PA-C   1 month ago Encounter for examination following treatment at hospital   [facility] Adeline Hone, PA-C   1 month ago Acute mid back pain   [facility] Donny Gall F, FNP   3 months ago Uncontrolled diabetes mellitus with hyperglycemia, with long-term current use of insulin  [facility])   [facility] Adeline Hone, PA-C       Future Appointments             In 2 months Dunn, Elvia Hammans, PA-C [facility] [place]   In 2 months Jorge Newcomer, MD [facility]

## 2024-03-12 ENCOUNTER — Other Ambulatory Visit: Payer: Self-pay

## 2024-03-16 ENCOUNTER — Telehealth: Payer: Self-pay | Admitting: Family

## 2024-03-16 NOTE — Progress Notes (Signed)
 [facility] Note    PCP: Adeline Hone, PA-C (last seen 05/25) Primary Cardiologist: Constancia Delton, MD/ Varney Gentleman, PA (last seen 04/25)  Chief Complaint: shortness of breath  HPI:   Mr Whirley is a 58 y/o male with a history of DM, hyperlipidemia, tracheobronchomalacia, nonobstructive CAD, HTN, gout, COPD, asthma, CKD, dyslipidemia, tobacco use and chronic heart failure.   Echo 11/04/20: EF 60-65% with Grade I DD, mild LVH/ LAE and normal PA pressure of 31.6 mmHg  Admitted 08/23/22 due to dyspnea, productive cough of brown sputum, mild sore throat, fatigue, dizziness and chest soreness. Steroid burst given. Initially given IV lasix  with transition to oral diuretics. Echo 08/23/22: EF of 60-65% along with mild LVH & mild MR. Lisinopril  held due to AKI. Discharged after 2 days. Admitted 11/22/22 due to acute on chronic HF. Weaned off of bipap.Was in the ED 03/01/23 due to neck pain after falling off his tractor after he hit a stump. He landed on his right side.Head CT/ cervical spine xrays were negative. Was in the ED 03/09/23 due to atypical chest pain. Chest CTA negative for PE. Was in the ED 03/22/23 due to nonspecific chest pain. Workup negative. Was in the ED 03/23/23 due to hyperglycemia with glucose of >400.    Admitted 05/27/23 due to acute onset of chest pain, shortness of breath and diaphoresis. Blood pressure was significantly elevated to 204/102 on admission. Troponins were mildly elevated up to 26. Cardiology was consulted and he underwent cardiac catheterization 8/14 which revealed non-obstructive coronary artery disease. Meds adjusted. Chest x-ray without acute cardiopulmonary process. Echo on 05/28/2023 showed an EF of 50 to 55%, no regional wall motion abnormalities, moderate LVH, grade 3 diastolic dysfunction, elevated left atrial pressure, normal RV systolic function and ventricular cavity size, mildly dilated left atrium, mild mitral regurgitation, aortic valve sclerosis  without evidence of stenosis, and an estimated right atrial pressure of 3 mmHg.   Admitted 08/19/23 with chest pain/ pressure for 2 days located in the front chest. Worse with deep breath. Has shortness of breath and cough along with this. Negative D-dimer. Troponin 23 => 22. 1 dose of IV Lasix  as BNP 143.9. Glucose 409. Was in the ED 08/31/23 with central chest pressure along with shortness of breath & cough. Labs reassuring with normal troponin. Negative D-dimer. CXR concerning for pneumonia so antibiotics provided.   Admitted 10/21/23 due to acute hypoxic respiratory failure secondary to COPD exacerbation and acute on chronic HFpEF. Found to have worsening CKD. High-sensitivity troponin 21 with a delta troponin of 20 subsequently trended to 19. BNP 331. D-dimer negative. Supplemental oxygen given. Initially given IV lasix  with transition to increased home torsemide . Lisinopril , spironolactone , and torsemide  were held due to AKI.    R/LHC 10/30/23:  Moderate LAD and OM1 disease, not significantly different compared to prior catheterization in 05/2023.  RFR of sequential ostial and proximal LAD stenosis at that time was not hemodynamically significant. Normal left and right heart filling pressures. Mild pulmonary hypertension. Normal Fick cardiac output/index.        Echo 11/12/23: EF 55-60%, normal RV, mild MR  Admitted 01/20/24 with worsening cough, wheezing & shortness of breath. Gained 10 pounds in the last month.IV diuresed. Chest x-ray showed pulmonary congestion as well as chronic interstitial changes compatible with COPD. Meds changed.   Admitted 02/17/24 with cough, shortness of breath, orthopnea, exertional dyspnea, chest tightness, and leg swelling. Patient has gained 7-8 pounds since previous discharge dry weight. On admission, BNP was 300.0  and HS-troponin was 27. Chest x-ray noted mild cardiomegaly and interstitial edema. Placed on bipap, given IV lasix . Torsemide , lisinopril  and bisoprolol  all  held  Seen in [facility] 05/25 and had lisinopril  switched to valsartan  40mg  daily and torsemide  40mg  daily resumed.   He presents today for a HF follow-up visit with a chief complaint of shortness of breath. Has associated dry cough, fatigue, neck pain, occasional palpitations. Denies chest pain, abdominal distention, pedal edema or difficulty sleeping. Says that he had "gotten sick" ~ 10 days ago and has a residual cough and runny nose. He says that his sister had also gotten sick around this time. No fevers to report.   Has not taken his morning medications yet today as he hasn't eaten breakfast yet.   ROS: All systems negative except as listed in HPI, PMH and Problem List.  SH:  Social History   Socioeconomic History   Marital status: Divorced    Spouse name: Not on file   Number of children: 0   Years of education: 9   Highest education level: 9th grade  Occupational History   Occupation: disability  Tobacco Use   Smoking status: Some Days    Current packs/day: 0.00    Average packs/day: 1 pack/day for 37.0 years (37.0 ttl pk-yrs)    Types: Cigarettes    Start date: 10/19/1989    Last attempt to quit: 08/20/2022    Years since quitting: 1.5   Smokeless tobacco: Never   Tobacco comments:    30+ years hx smoking 2 ppd, stopped smoking briefly in April 2022 for a surgery and then restarted smoking about 1/2 ppd last 4-5 months         5-6cigs daily- 04/10/2023  Vaping Use   Vaping status: Never Used  Substance and Sexual Activity   Alcohol use: No   Drug use: No   Sexual activity: Not Currently    Partners: Female  Other Topics Concern   Not on file  Social History Narrative   Lives with sister Jenette Mitchell, brother in law Genella Kendall), and his mother - Montavis Schubring   Social Drivers of Health   Financial Resource Strain: High Risk (01/21/2024)   Overall Financial Resource Strain (CARDIA)    Difficulty of Paying Living Expenses: Hard  Food Insecurity: No Food Insecurity (02/17/2024)   Hunger  Vital Sign    Worried About Running Out of Food in the Last Year: Never true    Ran Out of Food in the Last Year: Never true  Transportation Needs: No Transportation Needs (02/17/2024)   PRAPARE - Administrator, Civil Service (Medical): No    Lack of Transportation (Non-Medical): No  Physical Activity: Insufficiently Active (12/06/2022)   Exercise Vital Sign    Days of Exercise per Week: 3 days    Minutes of Exercise per Session: 10 min  Stress: No Stress Concern Present (12/06/2022)   [employer] of Occupational Health - Occupational Stress Questionnaire    Feeling of Stress : Only a little  Social Connections: Socially Isolated (02/17/2024)   Social Connection and Isolation Panel [NHANES]    Frequency of Communication with Friends and Family: More than three times a week    Frequency of Social Gatherings with Friends and Family: Twice a week    Attends Religious Services: Never    Database administrator or Organizations: No    Attends Banker Meetings: Never    Marital Status: Divorced  Catering manager Violence: Not At Risk (  02/17/2024)   Humiliation, Afraid, Rape, and Kick questionnaire    Fear of Current or Ex-Partner: No    Emotionally Abused: No    Physically Abused: No    Sexually Abused: No    FH:  Family History  Problem Relation Age of Onset   Heart disease Mother    Diabetes Mother    Hyperlipidemia Mother    Hypertension Mother    Heart attack Mother    Lung cancer Father    Hypertension Father    Stroke Father    AAA (abdominal aortic aneurysm) Maternal Grandmother    Heart attack Maternal Grandmother    Diabetes Maternal Grandfather     Past Medical History:  Diagnosis Date   Bulging of cervical intervertebral disc    CHF (congestive heart failure) (HCC)    COPD (chronic obstructive pulmonary disease) (HCC)    Diabetes mellitus without complication (HCC)    Gout    High cholesterol    Hypertension    Stress due to family  tension 04/15/2019    Current Outpatient Medications  Medication Sig Dispense Refill   allopurinol  (ZYLOPRIM ) 300 MG tablet Take 1 tablet (300 mg total) by mouth daily with 100 mg tablet for a total of 400mg  daily for gout. 90 tablet 1   amLODipine  (NORVASC ) 10 MG tablet Take 1 tablet (10 mg total) by mouth daily. 30 tablet 3   aspirin  EC 81 MG tablet Take 1 tablet (81 mg total) by mouth daily. Swallow whole. 90 tablet 3   baclofen  (LIORESAL ) 10 MG tablet Take 1 tablet (10 mg total) by mouth 3 (three) times daily as needed for muscle spasms. 90 tablet 3   bisoprolol  (ZEBETA ) 10 MG tablet Take 1 tablet (10 mg total) by mouth daily. 90 tablet 3   budesonide -formoterol  (SYMBICORT ) 160-4.5 MCG/ACT inhaler Inhale 2 puffs into the lungs 2 (two) times daily. 10.2 g 12   cromolyn  (OPTICROM ) 4 % ophthalmic solution Place 1 drop into both eyes 4 (four) times daily as needed. 10 mL 2   Dulaglutide  (TRULICITY ) 3 MG/0.5ML SOAJ Inject 3 mg into the skin once a week. 2 mL 10   DULoxetine  (CYMBALTA ) 30 MG capsule Take 2 capsules (60 mg total) by mouth every morning AND 1 capsule (30 mg total) every evening. 270 capsule 0   empagliflozin  (JARDIANCE ) 10 MG TABS tablet Take 1 tablet (10 mg total) by mouth daily. 90 tablet 1   Evolocumab  (REPATHA ) 140 MG/ML SOSY Inject 140 mg into the skin every 14 (fourteen) days. 2 mL 11   ezetimibe  (ZETIA ) 10 MG tablet Take 1 tablet (10 mg total) by mouth daily. 90 tablet 0   fluticasone  (FLONASE ) 50 MCG/ACT nasal spray Place 1 spray into both nostrils daily. 16 g 6   Glucagon  (GVOKE HYPOPEN  1-PACK) 1 MG/0.2ML SOAJ Inject 1 mg into the skin as needed. 1.8 mL 0   ipratropium-albuterol  (DUONEB) 0.5-2.5 (3) MG/3ML SOLN Take 3 mLs by nebulization every 6 (six) hours as needed. 180 mL 1   levocetirizine (XYZAL ) 5 MG tablet Take 1 tablet (5 mg total) by mouth every evening. 90 tablet 1   nicotine  (NICODERM CQ  - DOSED IN MG/24 HR) 7 mg/24hr patch Place 1 patch (7 mg total) onto the  skin daily. 90 patch 1   nicotine  polacrilex (NICOTINE  MINI) 4 MG lozenge Take 1 lozenge (4 mg total) by mouth as needed. 100 tablet 0   pantoprazole  (PROTONIX ) 40 MG tablet Take 1 tablet (40 mg total) by mouth daily.  90 tablet 1   pantoprazole  (PROTONIX ) 40 MG tablet Take 1 tablet (40 mg total) by mouth daily. 90 tablet 1   pregabalin  (LYRICA ) 50 MG capsule Take 1 capsule (50 mg total) by mouth 2 (two) times daily. 60 capsule 0   rosuvastatin  (CRESTOR ) 40 MG tablet Take 1 tablet (40 mg total) by mouth daily. 90 tablet 0   spironolactone  (ALDACTONE ) 25 MG tablet Take 1 tablet (25 mg total) by mouth daily. 90 tablet 3   Tiotropium Bromide  Monohydrate (SPIRIVA  RESPIMAT) 2.5 MCG/ACT AERS Inhale 2 puffs into the lungs daily. 4 g 11   torsemide  (DEMADEX ) 20 MG tablet Take 2 tablets (40 mg total) by mouth daily. 60 tablet 6   valsartan  (DIOVAN ) 40 MG tablet Take 1 tablet (40 mg total) by mouth daily. 30 tablet 6   VENTOLIN  HFA 108 (90 Base) MCG/ACT inhaler Inhale 2 puffs into the lungs every 6 (six) hours as needed for wheezing or shortness of breath. 18 g 2   No current facility-administered medications for this visit.   Vitals:   03/17/24 0945  BP: (!) 167/92  Pulse: 75  SpO2: 97%  Weight: 231 lb 3.2 oz (104.9 kg)   Wt Readings from Last 3 Encounters:  03/17/24 231 lb 3.2 oz (104.9 kg)  03/03/24 225 lb 3.2 oz (102.2 kg)  02/25/24 216 lb (98 kg)   Lab Results  Component Value Date   CREATININE 1.22 02/25/2024   CREATININE 1.53 (H) 02/20/2024   CREATININE 1.87 (H) 02/19/2024    PHYSICAL EXAM:  General: Well appearing. No resp difficulty HEENT: normal Neck: supple, no JVD Cor: Regular rhythm, rate. No rubs, gallops or murmurs Lungs: clear Abdomen: soft, nontender, nondistended. Extremities: no cyanosis, clubbing, rash, 1+ pitting edema bilateral lower legs Neuro: alert & oriented X 3. Moves all 4 extremities w/o difficulty. Affect pleasant   ECG: not done   ASSESSMENT &  PLAN:  1: NICM with preserved ejection fraction- - suscpect due to HTN and OSA - NYHA class III - mildly fluid up with weight gain and shortness of breath - weighing daily; reminded to call for an overnight weight gain of >2 pounds or a weekly weight gain of > 5 pounds - weight up 6 pounds from last visit here 2 weeks ago - Echo 11/04/20: EF 60-65% with Grade I DD, mild LVH/ LAE and normal PA pressure of 31.6 mmHg - Echo 08/23/22: EF of 60-65% along with mild LVH & mild MR. - Echo 05/28/23: EF 50-55% with moderate LVH, Grade III DD and mild MR - Echo 11/12/23: EF 55-60%, normal RV, mild MR - continue bisoprolol  5mg  daily - continue jardiance  10mg  every day - sent RX to begin entresto  but then pharmacy called to say that he has an entresto  allergy; advised pharm to not fill entresto  and will increase valsartan  to 80mg  daily since he seems to be tolerating this; pharmacy says they will let patient know - continue torsemide  40mg  daily  - continue spironolactone  25mg  daily - BNP 02/17/24 was 300 - emphasized the importance of bringing medication packs to every visit  2: HTN- - BP 167/92; no meds taken today but he says that he's getting these high readings at home as well - increasing valsartan  per above - saw PCP Jefm Minium) 05/25  - BMP 02/25/24 reviewed: sodium 139, potassium 4.2, creatinine 1.22, GFR >60  - BMET today  3: DM- - A1c 11/28/23 was 7.9% - saw endocrinology (Rhinehart) 07/24 - 03/21/23 microalbumin/ creat ratio was 1338  4: Reactive airway disease- - saw pulmonology (Dgayli) 06/24 - using spiriva   - has not smoked in the last 2-3 weeks - CPAP was returned as he wasn't wearing it long enough each night - has pulm appt 08/25; needs sleep apnea addressed or fluid retention will most likely continue  5: Hyperlipidemia- - continue ezetimibe  10mg  daily - continue repatha  every 2 weeks - continue rosuvastatin  40mg  daily - lipid panel 11/15/23 showed LDL 101, triglycerides 209 (both  improving) - lipid panel at next visit  6: CAD- - saw cardiology (Dunn) 04/25 - continue ezetimibe  10mg  daily - continue rosuvastatin  40mg  daily - [facility] 10/30/23:    Moderate LAD and OM1 disease, not significantly different compared to prior catheterization in 05/2023.  RFR of sequential ostial and proximal LAD stenosis at that time was not hemodynamically significant.   Normal left and right heart filling pressures.   Mild pulmonary hypertension.   Normal Fick cardiac output/index.   Return in 1 month, sooner if needed.   Charlette Console, FNP 03/16/24

## 2024-03-16 NOTE — Telephone Encounter (Signed)
 Called to confirm/remind patient of their appointment at the [facility] on 03/17/24.   Appointment:   [] Confirmed  [x] Left mess   [] No answer/No voice mail  [] VM Full/unable to leave message  [] Phone not in service  Patient reminded to bring all medications and/or complete list.  Confirmed patient has transportation. Gave directions, instructed to utilize valet parking.

## 2024-03-17 ENCOUNTER — Other Ambulatory Visit: Payer: Self-pay

## 2024-03-17 ENCOUNTER — Encounter: Payer: Self-pay | Admitting: Family

## 2024-03-17 ENCOUNTER — Ambulatory Visit: Attending: Family | Admitting: Family

## 2024-03-17 ENCOUNTER — Other Ambulatory Visit (HOSPITAL_COMMUNITY): Payer: Self-pay

## 2024-03-17 VITALS — BP 167/92 | HR 75 | Wt 231.2 lb

## 2024-03-17 DIAGNOSIS — N189 Chronic kidney disease, unspecified: Secondary | ICD-10-CM | POA: Insufficient documentation

## 2024-03-17 DIAGNOSIS — E782 Mixed hyperlipidemia: Secondary | ICD-10-CM

## 2024-03-17 DIAGNOSIS — J4489 Other specified chronic obstructive pulmonary disease: Secondary | ICD-10-CM | POA: Diagnosis not present

## 2024-03-17 DIAGNOSIS — Z87891 Personal history of nicotine dependence: Secondary | ICD-10-CM | POA: Insufficient documentation

## 2024-03-17 DIAGNOSIS — I2510 Atherosclerotic heart disease of native coronary artery without angina pectoris: Secondary | ICD-10-CM | POA: Diagnosis not present

## 2024-03-17 DIAGNOSIS — E119 Type 2 diabetes mellitus without complications: Secondary | ICD-10-CM | POA: Diagnosis not present

## 2024-03-17 DIAGNOSIS — R0602 Shortness of breath: Secondary | ICD-10-CM | POA: Diagnosis present

## 2024-03-17 DIAGNOSIS — E785 Hyperlipidemia, unspecified: Secondary | ICD-10-CM | POA: Diagnosis not present

## 2024-03-17 DIAGNOSIS — Z7984 Long term (current) use of oral hypoglycemic drugs: Secondary | ICD-10-CM | POA: Insufficient documentation

## 2024-03-17 DIAGNOSIS — I130 Hypertensive heart and chronic kidney disease with heart failure and stage 1 through stage 4 chronic kidney disease, or unspecified chronic kidney disease: Secondary | ICD-10-CM | POA: Diagnosis not present

## 2024-03-17 DIAGNOSIS — M109 Gout, unspecified: Secondary | ICD-10-CM | POA: Insufficient documentation

## 2024-03-17 DIAGNOSIS — J45909 Unspecified asthma, uncomplicated: Secondary | ICD-10-CM | POA: Diagnosis not present

## 2024-03-17 DIAGNOSIS — I428 Other cardiomyopathies: Secondary | ICD-10-CM | POA: Insufficient documentation

## 2024-03-17 DIAGNOSIS — J398 Other specified diseases of upper respiratory tract: Secondary | ICD-10-CM | POA: Insufficient documentation

## 2024-03-17 DIAGNOSIS — I5032 Chronic diastolic (congestive) heart failure: Secondary | ICD-10-CM | POA: Diagnosis not present

## 2024-03-17 DIAGNOSIS — Z794 Long term (current) use of insulin: Secondary | ICD-10-CM | POA: Diagnosis not present

## 2024-03-17 DIAGNOSIS — I10 Essential (primary) hypertension: Secondary | ICD-10-CM | POA: Diagnosis not present

## 2024-03-17 DIAGNOSIS — Z7985 Long-term (current) use of injectable non-insulin antidiabetic drugs: Secondary | ICD-10-CM | POA: Insufficient documentation

## 2024-03-17 DIAGNOSIS — E1122 Type 2 diabetes mellitus with diabetic chronic kidney disease: Secondary | ICD-10-CM | POA: Insufficient documentation

## 2024-03-17 MED ORDER — SACUBITRIL-VALSARTAN 49-51 MG PO TABS
1.0000 | ORAL_TABLET | Freq: Two times a day (BID) | ORAL | 5 refills | Status: DC
  Filled 2024-03-17: qty 60, 30d supply, fill #0

## 2024-03-17 MED ORDER — VALSARTAN 80 MG PO TABS
80.0000 mg | ORAL_TABLET | Freq: Every day | ORAL | 5 refills | Status: DC
Start: 2024-03-17 — End: 2024-08-14
  Filled 2024-03-17 (×2): qty 30, 30d supply, fill #0
  Filled 2024-04-09: qty 30, 30d supply, fill #1
  Filled 2024-05-29 – 2024-06-01 (×2): qty 30, 30d supply, fill #2
  Filled 2024-07-09 – 2024-07-14 (×2): qty 30, 30d supply, fill #3

## 2024-03-17 NOTE — Patient Instructions (Signed)
 Medication Changes:  STOP Losartan  START Entresto  49/51mg  (1 tab) daily.  Lab Work:  Go DOWN to LOWER LEVEL (LL) to have your blood work completed inside of [employer] office.   We will only call you if the results are abnormal or if the provider would like to make medication changes.   Special Instructions // Education:  Please bring all medications to next appointment.  Follow-Up in: Please follow up with the [facility] Clinic in 1 month with Shawnee Dellen, FNP.  At the [facility], you and your health needs are our priority. We have a designated team specialized in the treatment of Heart Failure. This Care Team includes your primary Heart Failure Specialized Cardiologist (physician), Advanced Practice Providers (APPs- Physician Assistants and Nurse Practitioners), and Pharmacist who all work together to provide you with the care you need, when you need it.   You may see any of the following providers on your designated Care Team at your next follow up:  Dr. Jules Oar Dr. Peder Bourdon Dr. Alwin Baars Dr. Judyth Nunnery Shawnee Dellen, FNP Bevely Brush, RPH-CPP  Please be sure to bring in all your medications bottles to every appointment.   Need to Contact Us :  If you have any questions or concerns before your next appointment please send us  a message through [place] or call our office at [phone].    TO LEAVE A MESSAGE FOR THE NURSE SELECT OPTION 2, PLEASE LEAVE A MESSAGE INCLUDING: YOUR NAME DATE OF BIRTH CALL BACK NUMBER REASON FOR CALL**this is important as we prioritize the call backs  YOU WILL RECEIVE A CALL BACK THE SAME DAY AS LONG AS YOU CALL BEFORE 4:00 PM

## 2024-03-18 ENCOUNTER — Ambulatory Visit: Payer: Self-pay | Admitting: Family

## 2024-03-18 ENCOUNTER — Other Ambulatory Visit: Payer: Self-pay

## 2024-03-18 LAB — BASIC METABOLIC PANEL WITH GFR
BUN/Creatinine Ratio: 10 (ref 9–20)
BUN: 12 mg/dL (ref 6–24)
CO2: 22 mmol/L (ref 20–29)
Calcium: 9.1 mg/dL (ref 8.7–10.2)
Chloride: 100 mmol/L (ref 96–106)
Creatinine, Ser: 1.15 mg/dL (ref 0.76–1.27)
Glucose: 160 mg/dL — ABNORMAL HIGH (ref 70–99)
Potassium: 4.4 mmol/L (ref 3.5–5.2)
Sodium: 138 mmol/L (ref 134–144)
eGFR: 74 mL/min/{1.73_m2} (ref >59)

## 2024-03-19 ENCOUNTER — Other Ambulatory Visit: Payer: Self-pay

## 2024-03-20 ENCOUNTER — Other Ambulatory Visit: Payer: Self-pay

## 2024-03-23 ENCOUNTER — Other Ambulatory Visit: Payer: Self-pay

## 2024-04-02 ENCOUNTER — Emergency Department
Admission: EM | Admit: 2024-04-02 | Discharge: 2024-04-02 | Disposition: A | Discharge status: Home / Self Care | Attending: Emergency Medicine | Admitting: Emergency Medicine

## 2024-04-02 ENCOUNTER — Other Ambulatory Visit: Payer: Self-pay

## 2024-04-02 ENCOUNTER — Emergency Department

## 2024-04-02 DIAGNOSIS — I5030 Unspecified diastolic (congestive) heart failure: Secondary | ICD-10-CM | POA: Insufficient documentation

## 2024-04-02 DIAGNOSIS — E1122 Type 2 diabetes mellitus with diabetic chronic kidney disease: Secondary | ICD-10-CM | POA: Insufficient documentation

## 2024-04-02 DIAGNOSIS — R0789 Other chest pain: Secondary | ICD-10-CM | POA: Diagnosis not present

## 2024-04-02 DIAGNOSIS — I130 Hypertensive heart and chronic kidney disease with heart failure and stage 1 through stage 4 chronic kidney disease, or unspecified chronic kidney disease: Secondary | ICD-10-CM | POA: Diagnosis not present

## 2024-04-02 DIAGNOSIS — I2510 Atherosclerotic heart disease of native coronary artery without angina pectoris: Secondary | ICD-10-CM | POA: Insufficient documentation

## 2024-04-02 DIAGNOSIS — J449 Chronic obstructive pulmonary disease, unspecified: Secondary | ICD-10-CM | POA: Insufficient documentation

## 2024-04-02 DIAGNOSIS — N189 Chronic kidney disease, unspecified: Secondary | ICD-10-CM | POA: Diagnosis not present

## 2024-04-02 DIAGNOSIS — R079 Chest pain, unspecified: Secondary | ICD-10-CM

## 2024-04-02 DIAGNOSIS — Z981 Arthrodesis status: Secondary | ICD-10-CM | POA: Diagnosis not present

## 2024-04-02 DIAGNOSIS — I10 Essential (primary) hypertension: Secondary | ICD-10-CM

## 2024-04-02 DIAGNOSIS — R200 Anesthesia of skin: Secondary | ICD-10-CM | POA: Diagnosis not present

## 2024-04-02 LAB — BASIC METABOLIC PANEL WITH GFR
Anion gap: 12 (ref 5–15)
BUN: 16 mg/dL (ref 6–20)
CO2: 23 mmol/L (ref 22–32)
Calcium: 9.2 mg/dL (ref 8.9–10.3)
Chloride: 105 mmol/L (ref 98–111)
Creatinine, Ser: 1.33 mg/dL — ABNORMAL HIGH (ref 0.61–1.24)
GFR, Estimated: >60 mL/min (ref >60)
Glucose, Bld: 285 mg/dL — ABNORMAL HIGH (ref 70–99)
Potassium: 3.6 mmol/L (ref 3.5–5.1)
Sodium: 140 mmol/L (ref 135–145)

## 2024-04-02 LAB — CBC
HCT: 43.9 % (ref 39.0–52.0)
Hemoglobin: 14.3 g/dL (ref 13.0–17.0)
MCH: 28.3 pg (ref 26.0–34.0)
MCHC: 32.6 g/dL (ref 30.0–36.0)
MCV: 86.8 fL (ref 80.0–100.0)
Platelets: 174 10*3/uL (ref 150–400)
RBC: 5.06 MIL/uL (ref 4.22–5.81)
RDW: 14.3 % (ref 11.5–15.5)
WBC: 10.3 10*3/uL (ref 4.0–10.5)
nRBC: 0 % (ref 0.0–0.2)

## 2024-04-02 LAB — TROPONIN I (HIGH SENSITIVITY)
Troponin I (High Sensitivity): 31 ng/L — ABNORMAL HIGH (ref <18)
Troponin I (High Sensitivity): 38 ng/L — ABNORMAL HIGH (ref <18)

## 2024-04-02 LAB — BRAIN NATRIURETIC PEPTIDE: B Natriuretic Peptide: 284 pg/mL — ABNORMAL HIGH (ref 0.0–100.0)

## 2024-04-02 MED ORDER — BISOPROLOL FUMARATE 5 MG PO TABS
10.0000 mg | ORAL_TABLET | Freq: Every day | ORAL | 2 refills | Status: DC
Start: 2024-04-02 — End: 2024-07-17
  Filled 2024-04-02: qty 180, 90d supply, fill #0
  Filled 2024-04-03: qty 50, 25d supply, fill #0
  Filled 2024-05-29: qty 50, 25d supply, fill #1
  Filled 2024-06-01: qty 60, 30d supply, fill #1
  Filled 2024-07-09: qty 60, 30d supply, fill #2

## 2024-04-02 NOTE — ED Provider Notes (Signed)
 [facility] Provider Note    Event Date/Time   First MD Initiated Contact with Patient 04/02/24 1657     (approximate)   History   Chest Pain   HPI Cory Rivas is a 58 y.o. male with history of HFpEF, nonocclusive CAD, HTN, HLD, DM2, CKD, COPD presenting today for chest pain.  Patient states earlier he had an episode of chest tightness associated with shortness of breath.  It had resolved by the time of arrival to the ER.  He denies any nausea, vomiting, sweating.  No abdominal pain, leg pain, leg swelling.  He has been taking all his medications as prescribed.  States that he has ambulated while in the ER with no ongoing symptoms.  Chart review: Reviewed most recent heart failure noted on 03/17/2024.  Patient has had multiple admissions this year related to CHF and COPD exacerbations.  Last heart cath on 10/30/2023 showed LAD with 55% stenosis but no other acute findings.     Physical Exam   Triage Vital Signs: ED Triage Vitals  Encounter Vitals Group     BP 04/02/24 1513 (!) 199/97     Girls Systolic BP Percentile --      Girls Diastolic BP Percentile --      Boys Systolic BP Percentile --      Boys Diastolic BP Percentile --      Pulse Rate 04/02/24 1513 97     Resp 04/02/24 1513 18     Temp 04/02/24 1513 98.1 F (36.7 C)     Temp Source 04/02/24 1513 Oral     SpO2 04/02/24 1513 97 %     Weight 04/02/24 1512 231 lb (104.8 kg)     Height 04/02/24 1512 5' 4 (1.626 m)     Head Circumference --      Peak Flow --      Pain Score 04/02/24 1512 6     Pain Loc --      Pain Education --      Exclude from Growth Chart --     Most recent vital signs: Vitals:   04/02/24 1930 04/02/24 1930  BP:    Pulse: 72   Resp: (!) 21   Temp:  98.2 F (36.8 C)  SpO2: 98%    Physical Exam: I have reviewed the vital signs and nursing notes. General: Awake, alert, no acute distress.  Nontoxic appearing. Head:  Atraumatic, normocephalic.   ENT:  EOM  intact, PERRL. Oral mucosa is pink and moist with no lesions. Neck: Neck is supple with full range of motion, No meningeal signs. Cardiovascular:  RRR, No murmurs. Peripheral pulses palpable and equal bilaterally. Respiratory:  Symmetrical chest wall expansion.  No rhonchi, rales, or wheezes.  Good air movement throughout.  No use of accessory muscles.   Musculoskeletal:  No cyanosis or edema. Moving extremities with full ROM Abdomen:  Soft, nontender, nondistended. Neuro:  GCS 15, moving all four extremities, interacting appropriately. Speech clear. Psych:  Calm, appropriate.   Skin:  Warm, dry, no rash.    ED Results / Procedures / Treatments   Labs (all labs ordered are listed, but only abnormal results are displayed) Labs Reviewed  BASIC METABOLIC PANEL WITH GFR - Abnormal; Notable for the following components:      Result Value   Glucose, Bld 285 (*)    Creatinine, Ser 1.33 (*)    All other components within normal limits  BRAIN NATRIURETIC PEPTIDE - Abnormal; Notable for the  following components:   B Natriuretic Peptide 284.0 (*)    All other components within normal limits  TROPONIN I (HIGH SENSITIVITY) - Abnormal; Notable for the following components:   Troponin I (High Sensitivity) 31 (*)    All other components within normal limits  TROPONIN I (HIGH SENSITIVITY) - Abnormal; Notable for the following components:   Troponin I (High Sensitivity) 38 (*)    All other components within normal limits  CBC     EKG My EKG interpretation: Rate of 96, normal sinus rhythm, normal axis, normal intervals.  No acute ST elevations or depressions.  He does have T wave inversions present in lead II, III, aVF, V5, and V6.  This is consistent with his most recent EKG on 02/17/2024.   RADIOLOGY Independently interpreted chest x-ray with no acute findings   PROCEDURES:  Critical Care performed: No  Procedures   MEDICATIONS ORDERED IN ED: Medications - No data to  display   IMPRESSION / MDM / ASSESSMENT AND PLAN / ED COURSE  I reviewed the triage vital signs and the nursing notes.                              Differential diagnosis includes, but is not limited to, CHF exacerbation, hypertensive urgency/emergency, ACS, pneumonia, COPD  Patient's presentation is most consistent with exacerbation of chronic illness.  Patient is a 58 year old male presenting today for exertional chest pain with shortness of breath.  Has felt somewhat similar to prior episodes related to his heart failure although does not have any ongoing shortness of breath or leg swelling.  Vital signs on arrival showed hypertension but otherwise no tachypnea or hypoxia.  Laboratory workup with unremarkable CBC.  BMP with creatinine relatively stable with prior values.  BNP is also near his prior levels.  First troponin of 31 and will get repeat.  Blood pressure in the room has improved from arrival but is now 178 systolic with no ongoing symptoms.  I ambulated patient around the room and he is completely asymptomatic at this time.  Repeat troponin of 38 which is less than a delta of 10 and no ongoing symptoms.  I did reach out to his cardiology team to discuss any potential medication adjustments as I suspect this is primarily driven from his CHF and hypertension.  Dr. Daneil Dunker with cardiology agrees with this.  He recommends increasing bisoprolol  to 10 mg daily.  Otherwise safer discharge and can follow-up with heart failure team in 2 weeks for reassessment.  Patient agreeable with this plan given strict return precautions.  The patient is on the cardiac monitor to evaluate for evidence of arrhythmia and/or significant heart rate changes. Clinical Course as of 04/02/24 2031  Thu Apr 02, 2024  2010 Troponin I (High Sensitivity)(!): 38 Delta less than 10 [DW]  2021 Spoke with Cardiology, Dr. Daneil Dunker regarding patients presentation and possible dose adjustment of his BP meds. Increase bisoprolol   to 10mg . Likely related to CHF and HTN. Otherwise follow up outpatient in 2 weeks with heart failure team. [DW]    Clinical Course User Index [DW] Kandee Orion, MD     FINAL CLINICAL IMPRESSION(S) / ED DIAGNOSES   Final diagnoses:  Chest pain, unspecified type  Hypertension, unspecified type     Rx / DC Orders   ED Discharge Orders          Ordered    bisoprolol  (ZEBETA ) 5 MG tablet  Daily  04/02/24 2029    AMB referral to CHF clinic        04/02/24 2030             Note:  This document was prepared using Dragon voice recognition software and may include unintentional dictation errors.   Kandee Orion, MD 04/02/24 2031

## 2024-04-02 NOTE — ED Triage Notes (Signed)
 Pt states pain to mid chest that radiates to L arm, pt endorses L arm numbness. Pt states pain started this morning. NAD. Pt states lightheadedness  and SOB.

## 2024-04-02 NOTE — Discharge Instructions (Addendum)
 The cardiologist has recommended an increase in your bisoprolol  to 10 mg daily.  I have sent this to your pharmacy.  Please take this as prescribed.  Please follow-up with your heart failure team in 2 weeks for reevaluation to see if your blood pressure is improving.

## 2024-04-03 ENCOUNTER — Observation Stay
Admission: EM | Admit: 2024-04-03 | Discharge: 2024-04-04 | Disposition: A | Discharge status: Home / Self Care | Attending: Obstetrics and Gynecology | Admitting: Obstetrics and Gynecology

## 2024-04-03 ENCOUNTER — Other Ambulatory Visit: Payer: Self-pay

## 2024-04-03 ENCOUNTER — Encounter: Payer: Self-pay | Admitting: Intensive Care

## 2024-04-03 ENCOUNTER — Emergency Department

## 2024-04-03 DIAGNOSIS — I110 Hypertensive heart disease with heart failure: Secondary | ICD-10-CM | POA: Diagnosis not present

## 2024-04-03 DIAGNOSIS — I5033 Acute on chronic diastolic (congestive) heart failure: Principal | ICD-10-CM | POA: Diagnosis present

## 2024-04-03 DIAGNOSIS — Z720 Tobacco use: Secondary | ICD-10-CM | POA: Diagnosis present

## 2024-04-03 DIAGNOSIS — F17220 Nicotine dependence, chewing tobacco, uncomplicated: Secondary | ICD-10-CM | POA: Diagnosis not present

## 2024-04-03 DIAGNOSIS — F39 Unspecified mood [affective] disorder: Secondary | ICD-10-CM | POA: Insufficient documentation

## 2024-04-03 DIAGNOSIS — M109 Gout, unspecified: Secondary | ICD-10-CM | POA: Diagnosis not present

## 2024-04-03 DIAGNOSIS — J4489 Other specified chronic obstructive pulmonary disease: Secondary | ICD-10-CM | POA: Diagnosis present

## 2024-04-03 DIAGNOSIS — F17210 Nicotine dependence, cigarettes, uncomplicated: Secondary | ICD-10-CM | POA: Diagnosis not present

## 2024-04-03 DIAGNOSIS — R0789 Other chest pain: Secondary | ICD-10-CM | POA: Diagnosis not present

## 2024-04-03 DIAGNOSIS — Z7982 Long term (current) use of aspirin: Secondary | ICD-10-CM | POA: Insufficient documentation

## 2024-04-03 DIAGNOSIS — G8929 Other chronic pain: Secondary | ICD-10-CM | POA: Insufficient documentation

## 2024-04-03 DIAGNOSIS — N182 Chronic kidney disease, stage 2 (mild): Secondary | ICD-10-CM | POA: Diagnosis present

## 2024-04-03 DIAGNOSIS — R079 Chest pain, unspecified: Secondary | ICD-10-CM | POA: Diagnosis present

## 2024-04-03 DIAGNOSIS — I2510 Atherosclerotic heart disease of native coronary artery without angina pectoris: Secondary | ICD-10-CM | POA: Diagnosis present

## 2024-04-03 DIAGNOSIS — Z794 Long term (current) use of insulin: Secondary | ICD-10-CM | POA: Diagnosis not present

## 2024-04-03 DIAGNOSIS — I509 Heart failure, unspecified: Principal | ICD-10-CM

## 2024-04-03 DIAGNOSIS — E66813 Obesity, class 3: Secondary | ICD-10-CM | POA: Diagnosis present

## 2024-04-03 DIAGNOSIS — E785 Hyperlipidemia, unspecified: Secondary | ICD-10-CM | POA: Insufficient documentation

## 2024-04-03 DIAGNOSIS — J449 Chronic obstructive pulmonary disease, unspecified: Secondary | ICD-10-CM | POA: Insufficient documentation

## 2024-04-03 DIAGNOSIS — I10 Essential (primary) hypertension: Secondary | ICD-10-CM | POA: Diagnosis present

## 2024-04-03 DIAGNOSIS — E119 Type 2 diabetes mellitus without complications: Secondary | ICD-10-CM | POA: Insufficient documentation

## 2024-04-03 DIAGNOSIS — R0602 Shortness of breath: Secondary | ICD-10-CM

## 2024-04-03 DIAGNOSIS — Z6841 Body Mass Index (BMI) 40.0 and over, adult: Secondary | ICD-10-CM | POA: Diagnosis not present

## 2024-04-03 DIAGNOSIS — E1169 Type 2 diabetes mellitus with other specified complication: Secondary | ICD-10-CM | POA: Diagnosis present

## 2024-04-03 DIAGNOSIS — I517 Cardiomegaly: Secondary | ICD-10-CM | POA: Diagnosis not present

## 2024-04-03 DIAGNOSIS — E1165 Type 2 diabetes mellitus with hyperglycemia: Secondary | ICD-10-CM

## 2024-04-03 LAB — CBC
HCT: 42.1 % (ref 39.0–52.0)
Hemoglobin: 13.9 g/dL (ref 13.0–17.0)
MCH: 28.7 pg (ref 26.0–34.0)
MCHC: 33 g/dL (ref 30.0–36.0)
MCV: 87 fL (ref 80.0–100.0)
Platelets: 167 10*3/uL (ref 150–400)
RBC: 4.84 MIL/uL (ref 4.22–5.81)
RDW: 14.6 % (ref 11.5–15.5)
WBC: 11.8 10*3/uL — ABNORMAL HIGH (ref 4.0–10.5)
nRBC: 0 % (ref 0.0–0.2)

## 2024-04-03 LAB — BASIC METABOLIC PANEL WITH GFR
Anion gap: 6 (ref 5–15)
BUN: 18 mg/dL (ref 6–20)
CO2: 25 mmol/L (ref 22–32)
Calcium: 8.6 mg/dL — ABNORMAL LOW (ref 8.9–10.3)
Chloride: 109 mmol/L (ref 98–111)
Creatinine, Ser: 1.16 mg/dL (ref 0.61–1.24)
GFR, Estimated: >60 mL/min (ref >60)
Glucose, Bld: 218 mg/dL — ABNORMAL HIGH (ref 70–99)
Potassium: 3.8 mmol/L (ref 3.5–5.1)
Sodium: 140 mmol/L (ref 135–145)

## 2024-04-03 LAB — BRAIN NATRIURETIC PEPTIDE: B Natriuretic Peptide: 320 pg/mL — ABNORMAL HIGH (ref 0.0–100.0)

## 2024-04-03 LAB — TROPONIN I (HIGH SENSITIVITY)
Troponin I (High Sensitivity): 26 ng/L — ABNORMAL HIGH (ref <18)
Troponin I (High Sensitivity): 28 ng/L — ABNORMAL HIGH (ref <18)

## 2024-04-03 LAB — CBG MONITORING, ED
Glucose-Capillary: 172 mg/dL — ABNORMAL HIGH (ref 70–99)
Glucose-Capillary: 300 mg/dL — ABNORMAL HIGH (ref 70–99)

## 2024-04-03 MED ORDER — BUDESON-GLYCOPYRROL-FORMOTEROL 160-9-4.8 MCG/ACT IN AERO
2.0000 | INHALATION_SPRAY | Freq: Two times a day (BID) | RESPIRATORY_TRACT | Status: DC
  Administered 2024-04-04: 2 via RESPIRATORY_TRACT
  Filled 2024-04-03: qty 5.9

## 2024-04-03 MED ORDER — ROSUVASTATIN CALCIUM 10 MG PO TABS
40.0000 mg | ORAL_TABLET | Freq: Every day | ORAL | Status: DC
  Administered 2024-04-04: 40 mg via ORAL
  Filled 2024-04-03: qty 4

## 2024-04-03 MED ORDER — FUROSEMIDE 10 MG/ML IJ SOLN
60.0000 mg | Freq: Once | INTRAMUSCULAR | Status: AC
  Administered 2024-04-03: 60 mg via INTRAVENOUS
  Filled 2024-04-03: qty 8

## 2024-04-03 MED ORDER — INSULIN ASPART 100 UNIT/ML IJ SOLN
0.0000 [IU] | Freq: Three times a day (TID) | INTRAMUSCULAR | Status: DC
  Administered 2024-04-03: 4 [IU] via SUBCUTANEOUS
  Administered 2024-04-04: 7 [IU] via SUBCUTANEOUS
  Filled 2024-04-03 (×2): qty 1

## 2024-04-03 MED ORDER — DULOXETINE HCL 30 MG PO CPEP
30.0000 mg | ORAL_CAPSULE | Freq: Every evening | ORAL | Status: DC
  Administered 2024-04-03: 30 mg via ORAL
  Filled 2024-04-03 (×2): qty 1

## 2024-04-03 MED ORDER — POLYETHYLENE GLYCOL 3350 17 G PO PACK: 17.0000 g | PACK | Freq: Every day | ORAL | Status: DC | PRN

## 2024-04-03 MED ORDER — DOCUSATE SODIUM 100 MG PO CAPS
100.0000 mg | ORAL_CAPSULE | Freq: Two times a day (BID) | ORAL | Status: DC
  Administered 2024-04-03 – 2024-04-04 (×2): 100 mg via ORAL
  Filled 2024-04-03 (×2): qty 1

## 2024-04-03 MED ORDER — AMLODIPINE BESYLATE 10 MG PO TABS
10.0000 mg | ORAL_TABLET | Freq: Every day | ORAL | Status: DC
  Administered 2024-04-04: 10 mg via ORAL
  Filled 2024-04-03: qty 1

## 2024-04-03 MED ORDER — SPIRONOLACTONE 25 MG PO TABS
25.0000 mg | ORAL_TABLET | Freq: Every day | ORAL | Status: DC
  Administered 2024-04-04: 25 mg via ORAL
  Filled 2024-04-03: qty 1

## 2024-04-03 MED ORDER — INSULIN ASPART 100 UNIT/ML IJ SOLN
0.0000 [IU] | Freq: Every day | INTRAMUSCULAR | Status: DC
  Administered 2024-04-03: 3 [IU] via SUBCUTANEOUS
  Filled 2024-04-03: qty 1

## 2024-04-03 MED ORDER — ONDANSETRON HCL 4 MG/2ML IJ SOLN: 4.0000 mg | Freq: Four times a day (QID) | INTRAMUSCULAR | Status: DC | PRN

## 2024-04-03 MED ORDER — NICOTINE 7 MG/24HR TD PT24
7.0000 mg | MEDICATED_PATCH | Freq: Every day | TRANSDERMAL | Status: DC
  Administered 2024-04-03 – 2024-04-04 (×2): 7 mg via TRANSDERMAL
  Filled 2024-04-03 (×2): qty 1

## 2024-04-03 MED ORDER — PANTOPRAZOLE SODIUM 40 MG PO TBEC
40.0000 mg | DELAYED_RELEASE_TABLET | Freq: Every day | ORAL | Status: DC
  Administered 2024-04-04: 40 mg via ORAL
  Filled 2024-04-03: qty 1

## 2024-04-03 MED ORDER — ENOXAPARIN SODIUM 60 MG/0.6ML IJ SOSY
50.0000 mg | PREFILLED_SYRINGE | INTRAMUSCULAR | Status: DC
  Administered 2024-04-03: 50 mg via SUBCUTANEOUS
  Filled 2024-04-03 (×2): qty 0.6

## 2024-04-03 MED ORDER — FLUTICASONE PROPIONATE 50 MCG/ACT NA SUSP
1.0000 | Freq: Every day | NASAL | Status: DC
  Administered 2024-04-04: 1 via NASAL
  Filled 2024-04-03: qty 16

## 2024-04-03 MED ORDER — ASPIRIN 81 MG PO TBEC
81.0000 mg | DELAYED_RELEASE_TABLET | Freq: Every day | ORAL | Status: DC
  Administered 2024-04-04: 81 mg via ORAL
  Filled 2024-04-03: qty 1

## 2024-04-03 MED ORDER — DULOXETINE HCL 30 MG PO CPEP
60.0000 mg | ORAL_CAPSULE | Freq: Every day | ORAL | Status: DC
  Administered 2024-04-04: 60 mg via ORAL
  Filled 2024-04-03: qty 2

## 2024-04-03 MED ORDER — IPRATROPIUM-ALBUTEROL 0.5-2.5 (3) MG/3ML IN SOLN
3.0000 mL | Freq: Once | RESPIRATORY_TRACT | Status: AC
  Administered 2024-04-03: 3 mL via RESPIRATORY_TRACT
  Filled 2024-04-03: qty 3

## 2024-04-03 MED ORDER — NICOTINE POLACRILEX 2 MG MT GUM: 4.0000 mg | CHEWING_GUM | OROMUCOSAL | Status: DC | PRN

## 2024-04-03 MED ORDER — IRBESARTAN 150 MG PO TABS
75.0000 mg | ORAL_TABLET | Freq: Every day | ORAL | Status: DC
  Administered 2024-04-04: 75 mg via ORAL
  Filled 2024-04-03: qty 1

## 2024-04-03 MED ORDER — BISOPROLOL FUMARATE 5 MG PO TABS
10.0000 mg | ORAL_TABLET | Freq: Every day | ORAL | Status: DC
  Administered 2024-04-04: 10 mg via ORAL
  Filled 2024-04-03: qty 2

## 2024-04-03 MED ORDER — ONDANSETRON HCL 4 MG PO TABS: 4.0000 mg | ORAL_TABLET | Freq: Four times a day (QID) | ORAL | Status: DC | PRN

## 2024-04-03 MED ORDER — HYDRALAZINE HCL 20 MG/ML IJ SOLN
5.0000 mg | INTRAMUSCULAR | Status: DC | PRN
  Administered 2024-04-03: 5 mg via INTRAVENOUS
  Filled 2024-04-03: qty 1

## 2024-04-03 MED ORDER — METHYLPREDNISOLONE SODIUM SUCC 125 MG IJ SOLR
125.0000 mg | Freq: Once | INTRAMUSCULAR | Status: AC
  Administered 2024-04-03: 125 mg via INTRAVENOUS
  Filled 2024-04-03: qty 2

## 2024-04-03 MED ORDER — EZETIMIBE 10 MG PO TABS
10.0000 mg | ORAL_TABLET | Freq: Every day | ORAL | Status: DC
  Administered 2024-04-04: 10 mg via ORAL
  Filled 2024-04-03: qty 1

## 2024-04-03 MED ORDER — FUROSEMIDE 10 MG/ML IJ SOLN
40.0000 mg | Freq: Two times a day (BID) | INTRAMUSCULAR | Status: DC
  Administered 2024-04-03 – 2024-04-04 (×2): 40 mg via INTRAVENOUS
  Filled 2024-04-03 (×2): qty 4

## 2024-04-03 MED ORDER — ACETAMINOPHEN 650 MG RE SUPP: 650.0000 mg | Freq: Four times a day (QID) | RECTAL | Status: DC | PRN

## 2024-04-03 MED ORDER — BISACODYL 5 MG PO TBEC: 5.0000 mg | DELAYED_RELEASE_TABLET | Freq: Every day | ORAL | Status: DC | PRN

## 2024-04-03 MED ORDER — ALLOPURINOL 100 MG PO TABS
100.0000 mg | ORAL_TABLET | Freq: Every day | ORAL | Status: DC
  Administered 2024-04-04: 100 mg via ORAL
  Filled 2024-04-03: qty 1

## 2024-04-03 MED ORDER — TIOTROPIUM BROMIDE MONOHYDRATE 2.5 MCG/ACT IN AERS: 2.0000 | INHALATION_SPRAY | Freq: Every day | RESPIRATORY_TRACT | Status: DC

## 2024-04-03 MED ORDER — LORATADINE 10 MG PO TABS
10.0000 mg | ORAL_TABLET | Freq: Every day | ORAL | Status: DC
  Administered 2024-04-03: 10 mg via ORAL
  Filled 2024-04-03: qty 1

## 2024-04-03 MED ORDER — FLUTICASONE FUROATE-VILANTEROL 200-25 MCG/ACT IN AEPB
1.0000 | INHALATION_SPRAY | Freq: Every day | RESPIRATORY_TRACT | Status: DC
  Filled 2024-04-03: qty 28

## 2024-04-03 MED ORDER — SODIUM CHLORIDE 0.9% FLUSH
3.0000 mL | Freq: Two times a day (BID) | INTRAVENOUS | Status: DC
  Administered 2024-04-03: 3 mL via INTRAVENOUS

## 2024-04-03 MED ORDER — ACETAMINOPHEN 325 MG PO TABS: 650.0000 mg | ORAL_TABLET | Freq: Four times a day (QID) | ORAL | Status: DC | PRN

## 2024-04-03 MED ORDER — BACLOFEN 10 MG PO TABS: 10.0000 mg | ORAL_TABLET | Freq: Three times a day (TID) | ORAL | Status: DC | PRN

## 2024-04-03 MED ORDER — PREGABALIN 50 MG PO CAPS
50.0000 mg | ORAL_CAPSULE | Freq: Two times a day (BID) | ORAL | Status: DC
  Administered 2024-04-03 – 2024-04-04 (×2): 50 mg via ORAL
  Filled 2024-04-03 (×2): qty 1

## 2024-04-03 MED ORDER — LEVOCETIRIZINE DIHYDROCHLORIDE 5 MG PO TABS: 5.0000 mg | ORAL_TABLET | Freq: Every evening | ORAL | Status: DC

## 2024-04-03 NOTE — ED Notes (Signed)
 Pt given supper tray. Pt set tray up and eating at this time. No assistance needed. Pt given cup of ice.

## 2024-04-03 NOTE — ED Triage Notes (Signed)
 Patient c/o sob and chest pressure that started this AM. Discharged last night from [facility] for same. Reports he was admitted due to CHF.  Reports dry cough for days  A&O x4 in triage

## 2024-04-03 NOTE — ED Notes (Signed)
 Pt given blankets and ice. Lights deemed and pt resting .

## 2024-04-03 NOTE — H&P (Signed)
 History and Physical    Patient: Cory Rivas ZOX:[id number] DOB: [birth year] DOA: 04/03/2024 DOS: the patient was seen and examined on 04/03/2024 PCP: Adeline Hone, PA-C  Patient coming from: Home - lives with sister and 49; NOK: Sister, Ardie Beckmann, [phone]   Chief Complaint: CP/SOB  HPI: Cory Rivas is a 58 y.o. male with medical history significant of HTN, HLD, chronic HFpEF, COPD, gout, DM, and morbid obesity who presented on 6/20 with CP and SOB.  He was last admitted from 5/5-8 with decompensated heart failure and was discharged from the ER last night (6/19) with similar symptoms. He reports that he started with a heart problem yesterday.  Came to the ER and sent home.  This AM, it was about 10-11 and he was having some difficulty breathing that was progressive, worse with ambulation or sitting up.   He tried 2 neb treatments without improvement.  He felt chest pressure, but less than yesterday.  He has never had an MI, had a negative stress test in the past.  He feels pretty good now, better maybe with the breathing treatment or Lasix .  He is on duiretics at home and has been compliant.    ER Course:   Seen yesterday for CHF, discharged with outpatient f/u.  He returned today with CP/SOB.  Given Duonebs and Lasix .  Likely CHF.  Sats 90-92% on RA, not on home O2.     Review of Systems: As mentioned in the history of present illness. All other systems reviewed and are negative. Past Medical History:  Diagnosis Date   Bulging of cervical intervertebral disc    CHF (congestive heart failure) (HCC)    COPD (chronic obstructive pulmonary disease) (HCC)    Diabetes mellitus without complication (HCC)    Gout    High cholesterol    Hypertension    Stress due to family tension 04/15/2019   Past Surgical History:  Procedure Laterality Date   BACK SURGERY     CARDIAC CATHETERIZATION     CHOLECYSTECTOMY     COLONOSCOPY WITH PROPOFOL  N/A 03/10/2020   Procedure: COLONOSCOPY  WITH PROPOFOL ;  Surgeon: Marnee Sink, MD;  Location: [facility];  Service: Endoscopy;  Laterality: N/A;   COLONOSCOPY WITH PROPOFOL  N/A 09/25/2023   Procedure: COLONOSCOPY WITH PROPOFOL ;  Surgeon: Marnee Sink, MD;  Location: [facility];  Service: Endoscopy;  Laterality: N/A;   CORONARY PRESSURE/FFR STUDY N/A 05/29/2023   Procedure: CORONARY PRESSURE/FFR STUDY;  Surgeon: Sammy Crisp, MD;  Location: [facility];  Service: Cardiovascular;  Laterality: N/A;   POLYPECTOMY  09/25/2023   Procedure: POLYPECTOMY;  Surgeon: Marnee Sink, MD;  Location: [facility];  Service: Endoscopy;;   RIGHT/LEFT HEART CATH AND CORONARY ANGIOGRAPHY N/A 05/29/2023   Procedure: RIGHT/LEFT HEART CATH AND CORONARY ANGIOGRAPHY;  Surgeon: Sammy Crisp, MD;  Location: [facility];  Service: Cardiovascular;  Laterality: N/A;   RIGHT/LEFT HEART CATH AND CORONARY ANGIOGRAPHY Bilateral 10/30/2023   Procedure: RIGHT/LEFT HEART CATH AND CORONARY ANGIOGRAPHY;  Surgeon: Sammy Crisp, MD;  Location: [facility];  Service: Cardiovascular;  Laterality: Bilateral;   Social History:  reports that he has been smoking cigarettes. He started smoking about 34 years ago. He has a 37 pack-year smoking history. He has never used smokeless tobacco. He reports that he does not drink alcohol and does not use drugs.  Allergies  Allergen Reactions   Oxycodone  Nausea And Vomiting   Entresto  [Sacubitril -Valsartan ] Nausea Only and Other (See Comments)    Lightheaded  and dizzy    Onion Nausea And Vomiting   Nitroglycerin  Itching and Rash    Family History  Problem Relation Age of Onset   Heart disease Mother    Diabetes Mother    Hyperlipidemia Mother    Hypertension Mother    Heart attack Mother    Lung cancer Father    Hypertension Father    Stroke Father    AAA (abdominal aortic aneurysm) Maternal Grandmother    Heart attack Maternal Grandmother    Diabetes Maternal Grandfather      Prior to Admission medications   Medication Sig Start Date End Date Taking? Authorizing Provider  allopurinol  (ZYLOPRIM ) 100 MG tablet  01/20/24  Yes [provider]  amLODipine  (NORVASC ) 10 MG tablet Take 1 tablet (10 mg total) by mouth daily. 10/30/23  Yes End, Veryl Gottron, MD  aspirin  EC 81 MG tablet Take 1 tablet (81 mg total) by mouth daily. Swallow whole. 11/11/23  Yes Tapia, Leisa, PA-C  baclofen  (LIORESAL ) 10 MG tablet Take 1 tablet (10 mg total) by mouth 3 (three) times daily as needed for muscle spasms. 01/27/24  Yes Tapia, Leisa, PA-C  bisoprolol  (ZEBETA ) 5 MG tablet Take 2 tablets (10 mg total) by mouth daily. 04/02/24  Yes Kandee Orion, MD  budesonide -formoterol  (SYMBICORT ) 160-4.5 MCG/ACT inhaler Inhale 2 puffs into the lungs 2 (two) times daily. 10/24/23  Yes Althia Atlas, MD  cromolyn  (OPTICROM ) 4 % ophthalmic solution Place 1 drop into both eyes 4 (four) times daily as needed. 02/10/24  Yes Tapia, Leisa, PA-C  Dulaglutide  (TRULICITY ) 3 MG/0.5ML SOAJ Inject 3 mg into the skin once a week. 09/05/23  Yes   DULoxetine  (CYMBALTA ) 30 MG capsule Take 2 capsules (60 mg total) by mouth every morning AND 1 capsule (30 mg total) every evening. 11/11/23  Yes Tapia, Leisa, PA-C  Evolocumab  (REPATHA ) 140 MG/ML SOSY Inject 140 mg into the skin every 14 (fourteen) days. 12/21/23  Yes Dunn, Elvia Hammans, PA-C  ezetimibe  (ZETIA ) 10 MG tablet Take 1 tablet (10 mg total) by mouth daily. 11/11/23  Yes Tapia, Leisa, PA-C  fluticasone  (FLONASE ) 50 MCG/ACT nasal spray Place 1 spray into both nostrils daily. 02/10/24 02/09/25 Yes Tapia, Leisa, PA-C  ipratropium-albuterol  (DUONEB) 0.5-2.5 (3) MG/3ML SOLN Take 3 mLs by nebulization every 6 (six) hours as needed. 02/10/24  Yes Tapia, Leisa, PA-C  levocetirizine (XYZAL ) 5 MG tablet Take 1 tablet (5 mg total) by mouth every evening. 02/10/24  Yes Tapia, Leisa, PA-C  nicotine  (NICODERM CQ  - DOSED IN MG/24 HR) 7 mg/24hr patch Place 1 patch (7 mg total) onto the skin  daily. 03/06/24  Yes Tapia, Leisa, PA-C  nicotine  polacrilex (NICOTINE  MINI) 4 MG lozenge Take 1 lozenge (4 mg total) by mouth as needed. 03/06/24  Yes Tapia, Leisa, PA-C  pantoprazole  (PROTONIX ) 40 MG tablet Take 1 tablet (40 mg total) by mouth daily. 09/16/23  Yes Quinton Buckler, FNP  pregabalin  (LYRICA ) 50 MG capsule Take 1 capsule (50 mg total) by mouth 2 (two) times daily. 11/11/23  Yes Tapia, Leisa, PA-C  rosuvastatin  (CRESTOR ) 40 MG tablet Take 1 tablet (40 mg total) by mouth daily. 11/11/23  Yes Tapia, Leisa, PA-C  spironolactone  (ALDACTONE ) 25 MG tablet Take 1 tablet (25 mg total) by mouth daily. 01/29/24 04/29/24 Yes Hackney, Cleotis Daily, FNP  Tiotropium Bromide  Monohydrate (SPIRIVA  RESPIMAT) 2.5 MCG/ACT AERS Inhale 2 puffs into the lungs daily. 11/05/23  Yes Dgayli, Berneta Brightly, MD  torsemide  (DEMADEX ) 20 MG tablet Take 2 tablets (40 mg total)  by mouth daily. 03/03/24  Yes Hackney, Brian Campanile A, FNP  valsartan  (DIOVAN ) 80 MG tablet Take 1 tablet (80 mg total) by mouth daily. 03/17/24  Yes Charlette Console, FNP  VENTOLIN  HFA 108 (90 Base) MCG/ACT inhaler Inhale 2 puffs into the lungs every 6 (six) hours as needed for wheezing or shortness of breath. 03/04/24  Yes Tapia, Leisa, PA-C  empagliflozin  (JARDIANCE ) 10 MG TABS tablet Take 1 tablet (10 mg total) by mouth daily. 11/11/23   Tapia, Leisa, PA-C  Glucagon  (GVOKE HYPOPEN  1-PACK) 1 MG/0.2ML SOAJ Inject 1 mg into the skin as needed. Patient not taking: Reported on 04/03/2024 11/01/23   Adeline Hone, PA-C    Physical Exam: Vitals:   04/03/24 1345 04/03/24 1430 04/03/24 1445 04/03/24 1500  BP:  (!) 151/82  (!) 160/79  Pulse: 68 78 79 81  Resp: (!) 24 (!) 29 (!) 32 (!) 24  Temp:      TempSrc:      SpO2: 92% 93% 93% 94%  Weight:      Height:       General:  Appears calm and comfortable and is in NAD, on RA, quite well-appearing Eyes:  PERRL, EOMI, normal lids, iris ENT:  grossly normal hearing, lips & tongue, mmm; edentulous Neck:  no LAD, masses or  thyromegaly Cardiovascular:  RRR, no m/r/g. 1-2+ LE edema.  Respiratory:   CTA bilaterally with no wheezes/rales/rhonchi.  Normal to mildly increased respiratory effort. Abdomen:  soft, NT, ND Skin:  no rash or induration seen on limited exam Musculoskeletal:  grossly normal tone BUE/BLE, good ROM, no bony abnormality Psychiatric:  grossly normal mood and affect, speech fluent and appropriate, AOx3 Neurologic:  CN 2-12 grossly intact, moves all extremities in coordinated fashion   Radiological Exams on Admission: Independently reviewed - see discussion in A/P where applicable  DG Chest Port 1 View Result Date: 04/03/2024 CLINICAL DATA:  Chest pressure short of breath EXAM: PORTABLE CHEST 1 VIEW COMPARISON:  04/02/2024 FINDINGS: Hardware in the cervicothoracic region. Mild cardiomegaly. Increased interstitial opacity compared to prior. No sizable effusion. No pneumothorax IMPRESSION: Mild cardiomegaly with increased interstitial opacity compared to prior, favored to represent edema, though atypical infection is an alternate consideration. Electronically Signed   By: Esmeralda Hedge M.D.   On: 04/03/2024 16:13   DG Chest 2 View Result Date: 04/02/2024 CLINICAL DATA:  Mid chest pain radiating to left arm. Left arm numbness. EXAM: CHEST - 2 VIEW COMPARISON:  Chest radiograph 02/18/2024, 02/17/2024, 01/21/2024 FINDINGS: Cardiac silhouette and mediastinal contours are within limits. The lungs are clear. No pleural effusion pneumothorax. Moderate multilevel degenerative disc changes of the thoracic spine. Partial visualization of cervical and upper thoracic spine posterior fusion hardware. IMPRESSION: No active cardiopulmonary disease. Electronically Signed   By: Bertina Broccoli M.D.   On: 04/02/2024 15:55    EKG: Independently reviewed.  NSR with rate 80; nonspecific ST changes with no evidence of acute ischemia   Labs on Admission: I have personally reviewed the available labs and imaging studies at  the time of the admission.  Pertinent labs:    Glucose 218 BNP 320 HS troponin 28, 26 WBC 11.8   Assessment and Plan: Principal Problem:   Acute on chronic diastolic (congestive) heart failure (HCC) Active Problems:   COPD with asthma (HCC)   Uncontrolled diabetes mellitus with hyperglycemia, with long-term current use of insulin  (HCC)   CAD (coronary artery disease)   Tobacco use   Hyperlipidemia associated with type 2 diabetes mellitus (HCC)  Essential hypertension   Class 3 obesity    Acute on chronic diastolic CHF Patient with known h/o chronic diastolic CHF presenting with worsening SOB and edema Last echo in 10/2023 showed normal EF and preserved diastolic dysfunction Overall, he appears clinically fairly well but has had 2 visits in 24 hours so will monitor overnight CXR not overly concerning for pulmonary edema (radiologist read is pending) Mildly elevated BNP Will place in observation status with telemetry CHF order set utilized Was given Lasix  60 mg x 1 in ER and will repeat with 40 mg IV x 2 doses Continue ASA On room air Normal kidney function at this time, will follow Mildly elevated HS troponin is likely related to demand ischemia; doubt ACS based on symptoms  HTN Continue home medications - amlodipine , bisoprolol , valsartan  (irbesartan per formulary) Will also add prn hydralazine   HLD Continue rosuvastatin , ezetimibe  Also on Repatha  as an outpatient  DM Recent A1c was 7.9, suboptimal control Hold Trulicity , Jardiance  Cover with resistant-scale SSI Carb modified diet   CAD Recent cath in 10/2022, similar to prior Stable troponins Very unlikely to be ACS  Chronic pain Continue baclofen , pregabalin   COPD Not on home O2 No current hypoxia Continue home Symbicort  (Breo per formulary), Flonase , Xyzal , Spiriva  Will add prn albuterol  nebs  Gout Continue allopurinol   Mood d/o Continue Cymbalta   Tobacco dependence Cessation  encouraged Continue nicotine  patch/lozenges  Morbid obesity Body mass index is 40.34 kg/m.Aaron Aas  Weight loss should be encouraged Outpatient PCP/bariatric medicine f/u encouraged Significantly low or high BMI is associated with higher medical risk including morbidity and mortality      Advance Care Planning:   Code Status: Full Code - Code status was discussed with the patient and/or family at the time of admission.  The patient would want to receive full resuscitative measures at this time.   Consults: CHF navigator; TOC team; nutrition  DVT Prophylaxis: Lovenox   Family Communication: None present  Severity of Illness: The appropriate patient status for this patient is OBSERVATION. Observation status is judged to be reasonable and necessary in order to provide the required intensity of service to ensure the patient's safety. The patient's presenting symptoms, physical exam findings, and initial radiographic and laboratory data in the context of their medical condition is felt to place them at decreased risk for further clinical deterioration. Furthermore, it is anticipated that the patient will be medically stable for discharge from the hospital within 2 midnights of admission.   Author: Lorita Rosa, MD 04/03/2024 4:17 PM  For on call review [email or web address].

## 2024-04-03 NOTE — ED Provider Notes (Signed)
 [facility] Provider Note    Event Date/Time   First MD Initiated Contact with Patient 04/03/24 1233     (approximate)   History   Chest Pain and Shortness of Breath   HPI  Cory Rivas is a 58 y.o. male with history of diabetes, CKD, CHF, CAD, COPD who presents with complaints of chest tightness and shortness of breath that started this morning.  Review of record demonstrates the patient was admitted on May 4 for pulmonary edema requiring BiPAP.  Patient was seen yesterday and discharged after treatment in the emergency department     Physical Exam   Triage Vital Signs: ED Triage Vitals  Encounter Vitals Group     BP 04/03/24 1232 (!) 175/91     Girls Systolic BP Percentile --      Girls Diastolic BP Percentile --      Boys Systolic BP Percentile --      Boys Diastolic BP Percentile --      Pulse Rate 04/03/24 1232 79     Resp 04/03/24 1232 (!) 22     Temp 04/03/24 1232 98.4 F (36.9 C)     Temp Source 04/03/24 1232 Oral     SpO2 04/03/24 1232 93 %     Weight 04/03/24 1229 106.6 kg (235 lb)     Height 04/03/24 1229 1.626 m (5' 4)     Head Circumference --      Peak Flow --      Pain Score 04/03/24 1229 4     Pain Loc --      Pain Education --      Exclude from Growth Chart --     Most recent vital signs: Vitals:   04/03/24 1330 04/03/24 1345  BP: (!) 157/86   Pulse:  68  Resp: (!) 21 (!) 24  Temp:    SpO2:  92%     General: Awake, no distress.  CV:  Good peripheral perfusion.  Resp:  Normal effort.  Abd:  No distention.  Other:     ED Results / Procedures / Treatments   Labs (all labs ordered are listed, but only abnormal results are displayed) Labs Reviewed  BASIC METABOLIC PANEL WITH GFR - Abnormal; Notable for the following components:      Result Value   Glucose, Bld 218 (*)    Calcium  8.6 (*)    All other components within normal limits  CBC - Abnormal; Notable for the following components:   WBC 11.8 (*)     All other components within normal limits  BRAIN NATRIURETIC PEPTIDE - Abnormal; Notable for the following components:   B Natriuretic Peptide 320.0 (*)    All other components within normal limits  TROPONIN I (HIGH SENSITIVITY) - Abnormal; Notable for the following components:   Troponin I (High Sensitivity) 26 (*)    All other components within normal limits  TROPONIN I (HIGH SENSITIVITY)     EKG  ED ECG REPORT I, Bryson Carbine, the attending physician, personally viewed and interpreted this ECG.  Date: 04/03/2024  Rhythm: normal sinus rhythm QRS Axis: normal Intervals: normal ST/T Wave abnormalities: normal Narrative Interpretation: no evidence of acute ischemia    RADIOLOGY Chest x-ray viewed interpret by me, suspect mild edema    PROCEDURES:  Critical Care performed:   Procedures   MEDICATIONS ORDERED IN ED: Medications  methylPREDNISolone  sodium succinate (SOLU-MEDROL ) 125 mg/2 mL injection 125 mg (has no administration in time range)  ipratropium-albuterol  (  DUONEB) 0.5-2.5 (3) MG/3ML nebulizer solution 3 mL (3 mLs Nebulization Given 04/03/24 1255)  ipratropium-albuterol  (DUONEB) 0.5-2.5 (3) MG/3ML nebulizer solution 3 mL (3 mLs Nebulization Given 04/03/24 1255)  furosemide  (LASIX ) injection 60 mg (60 mg Intravenous Given 04/03/24 1351)     IMPRESSION / MDM / ASSESSMENT AND PLAN / ED COURSE  I reviewed the triage vital signs and the nursing notes. Patient's presentation is most consistent with severe exacerbation of chronic illness.  Patient presents with chest tightness and shortness of breath as detailed above, he does tachypneic here, oxygen saturations 93%.  Differential includes CHF exacerbation, COPD exacerbation, pulmonary edema  Pending x-ray, labs, will treat with DuoNeb in the meantime as well as Solu-Medrol   X-ray read by me suspect mild edema, will treat with IV Lasix   Oxygen saturations hovering around 90%, will consult the hospitalist for  admission      FINAL CLINICAL IMPRESSION(S) / ED DIAGNOSES   Final diagnoses:  Acute on chronic congestive heart failure, unspecified heart failure type (HCC)  Shortness of breath     Rx / DC Orders   ED Discharge Orders     None        Note:  This document was prepared using Dragon voice recognition software and may include unintentional dictation errors.   Bryson Carbine, MD 04/03/24 [phone]

## 2024-04-04 DIAGNOSIS — I5033 Acute on chronic diastolic (congestive) heart failure: Secondary | ICD-10-CM | POA: Diagnosis not present

## 2024-04-04 LAB — BASIC METABOLIC PANEL WITH GFR
Anion gap: 12 (ref 5–15)
BUN: 26 mg/dL — ABNORMAL HIGH (ref 6–20)
CO2: 26 mmol/L (ref 22–32)
Calcium: 9 mg/dL (ref 8.9–10.3)
Chloride: 98 mmol/L (ref 98–111)
Creatinine, Ser: 1.48 mg/dL — ABNORMAL HIGH (ref 0.61–1.24)
GFR, Estimated: 55 mL/min — ABNORMAL LOW (ref >60)
Glucose, Bld: 236 mg/dL — ABNORMAL HIGH (ref 70–99)
Potassium: 4.2 mmol/L (ref 3.5–5.1)
Sodium: 136 mmol/L (ref 135–145)

## 2024-04-04 LAB — CBC
HCT: 45.5 % (ref 39.0–52.0)
Hemoglobin: 15 g/dL (ref 13.0–17.0)
MCH: 28.2 pg (ref 26.0–34.0)
MCHC: 33 g/dL (ref 30.0–36.0)
MCV: 85.5 fL (ref 80.0–100.0)
Platelets: 191 10*3/uL (ref 150–400)
RBC: 5.32 MIL/uL (ref 4.22–5.81)
RDW: 14.6 % (ref 11.5–15.5)
WBC: 14.1 10*3/uL — ABNORMAL HIGH (ref 4.0–10.5)
nRBC: 0 % (ref 0.0–0.2)

## 2024-04-04 LAB — GLUCOSE, CAPILLARY: Glucose-Capillary: 239 mg/dL — ABNORMAL HIGH (ref 70–99)

## 2024-04-04 NOTE — Discharge Summary (Signed)
 Cory Rivas FMW:[id number] DOB: 08-19-1966 DOA: 04/03/2024  PCP: Leavy Mole, PA-C  Admit date: 04/03/2024 Discharge date: 04/04/2024  Time spent: 35 minutes  Recommendations for Outpatient Follow-up:  Chf clinic and cardiology and pulmonology f/u as scheduled     Discharge Diagnoses:  Principal Problem:   Acute on chronic diastolic (congestive) heart failure (HCC) Active Problems:   COPD with asthma (HCC)   Uncontrolled diabetes mellitus with hyperglycemia, with long-term current use of insulin  (HCC)   CAD (coronary artery disease)   CKD (chronic kidney disease) stage 2, GFR 60-89 ml/min   Tobacco use   Hyperlipidemia associated with type 2 diabetes mellitus (HCC)   Essential hypertension   Class 3 obesity   Discharge Condition: improved  Diet recommendation: heart healthy  Filed Weights   04/03/24 1229 04/03/24 2259 04/04/24 0500  Weight: 106.6 kg 102.6 kg 102.6 kg    History of present illness:  From admission h and p Cory Rivas is a 58 y.o. male with medical history significant of HTN, HLD, chronic HFpEF, COPD, gout, DM, and morbid obesity who presented on 6/20 with CP and SOB.  He was last admitted from 5/5-8 with decompensated heart failure and was discharged from the ER last night (6/19) with similar symptoms. He reports that he started with a heart problem yesterday.  Came to the ER and sent home.  This AM, it was about 10-11 and he was having some difficulty breathing that was progressive, worse with ambulation or sitting up.   He tried 2 neb treatments without improvement.  He felt chest pressure, but less than yesterday.  He has never had an MI, had a negative stress test in the past.  He feels pretty good now, better maybe with the breathing treatment or Lasix .  He is on duiretics at home and has been compliant.   Hospital Course:  Patient presents with chest pressure and shortness of breath. W/u revealed mild chf exacerbation (pulm edema on cxr). Troponins  very mildly elevated, flat, and stable from priors, do not think ACS. Treated with IV lasix  and symptoms resolved, feeling well this morning, feels back to normal. We reviewed his CHF action plan including doubling up on diuretic as needed. Patient is also off his cpap (was not compliant), suspect this is playing a role in patient's ongoing symptoms, advised working to re-institute this (has pulm f/u scheduled).  Procedures: none   Consultations: none  Discharge Exam: Vitals:   04/04/24 0439 04/04/24 0810  BP: (!) 166/97 (!) 155/99  Pulse: 84 71  Resp: 18 16  Temp: 98.4 F (36.9 C) 98.1 F (36.7 C)  SpO2: 94% 96%    General: NAD Cardiovascular: RRR Respiratory: CTAB Ext: warm, no edema  Discharge Instructions   Discharge Instructions     Diet - low sodium heart healthy   Complete by: As directed    Increase activity slowly   Complete by: As directed       Allergies as of 04/04/2024       Reactions   Oxycodone  Nausea And Vomiting   Entresto  [sacubitril -valsartan ] Nausea Only, Other (See Comments)   Lightheaded and dizzy    Onion Nausea And Vomiting   Nitroglycerin  Itching, Rash        Medication List     TAKE these medications    allopurinol  100 MG tablet Commonly known as: ZYLOPRIM    amLODipine  10 MG tablet Commonly known as: NORVASC  Take 1 tablet (10 mg total) by mouth daily.   Aspirin   Low Dose 81 MG tablet Generic drug: aspirin  EC Take 1 tablet (81 mg total) by mouth daily. Swallow whole.   baclofen  10 MG tablet Commonly known as: LIORESAL  Take 1 tablet (10 mg total) by mouth 3 (three) times daily as needed for muscle spasms.   bisoprolol  5 MG tablet Commonly known as: ZEBETA  Take 2 tablets (10 mg total) by mouth daily.   cromolyn  4 % ophthalmic solution Commonly known as: OPTICROM  Place 1 drop into both eyes 4 (four) times daily as needed.   DULoxetine  30 MG capsule Commonly known as: CYMBALTA  Take 2 capsules (60 mg total) by mouth  every morning AND 1 capsule (30 mg total) every evening.   ezetimibe  10 MG tablet Commonly known as: ZETIA  Take 1 tablet (10 mg total) by mouth daily.   fluticasone  50 MCG/ACT nasal spray Commonly known as: FLONASE  Place 1 spray into both nostrils daily.   Gvoke HypoPen  1-Pack 1 MG/0.2ML Soaj Generic drug: Glucagon  Inject 1 mg into the skin as needed.   ipratropium-albuterol  0.5-2.5 (3) MG/3ML Soln Commonly known as: DUONEB Take 3 mLs by nebulization every 6 (six) hours as needed.   Jardiance  10 MG Tabs tablet Generic drug: empagliflozin  Take 1 tablet (10 mg total) by mouth daily.   levocetirizine 5 MG tablet Commonly known as: XYZAL  Take 1 tablet (5 mg total) by mouth every evening.   nicotine  7 mg/24hr patch Commonly known as: NICODERM CQ  - dosed in mg/24 hr Place 1 patch (7 mg total) onto the skin daily.   nicotine  polacrilex 4 MG lozenge Commonly known as: Nicotine  Mini Take 1 lozenge (4 mg total) by mouth as needed.   pantoprazole  40 MG tablet Commonly known as: PROTONIX  Take 1 tablet (40 mg total) by mouth daily.   pregabalin  50 MG capsule Commonly known as: LYRICA  Take 1 capsule (50 mg total) by mouth 2 (two) times daily.   Repatha  140 MG/ML Sosy Generic drug: Evolocumab  Inject 140 mg into the skin every 14 (fourteen) days.   rosuvastatin  40 MG tablet Commonly known as: CRESTOR  Take 1 tablet (40 mg total) by mouth daily.   Spiriva  Respimat 2.5 MCG/ACT Aers Generic drug: Tiotropium Bromide  Monohydrate Inhale 2 puffs into the lungs daily.   spironolactone  25 MG tablet Commonly known as: ALDACTONE  Take 1 tablet (25 mg total) by mouth daily.   Symbicort  160-4.5 MCG/ACT inhaler Generic drug: budesonide -formoterol  Inhale 2 puffs into the lungs 2 (two) times daily.   torsemide  20 MG tablet Commonly known as: DEMADEX  Take 2 tablets (40 mg total) by mouth daily.   Trulicity  3 MG/0.5ML Soaj Generic drug: Dulaglutide  Inject 3 mg into the skin once a  week.   valsartan  80 MG tablet Commonly known as: Diovan  Take 1 tablet (80 mg total) by mouth daily.   Ventolin  HFA 108 (90 Base) MCG/ACT inhaler Generic drug: albuterol  Inhale 2 puffs into the lungs every 6 (six) hours as needed for wheezing or shortness of breath.       Allergies  Allergen Reactions   Oxycodone  Nausea And Vomiting   Entresto  [Sacubitril -Valsartan ] Nausea Only and Other (See Comments)    Lightheaded and dizzy    Onion Nausea And Vomiting   Nitroglycerin  Itching and Rash      The results of significant diagnostics from this hospitalization (including imaging, microbiology, ancillary and laboratory) are listed below for reference.    Significant Diagnostic Studies: DG Chest Port 1 View Result Date: 04/03/2024 CLINICAL DATA:  Chest pressure short of breath EXAM: PORTABLE CHEST  1 VIEW COMPARISON:  04/02/2024 FINDINGS: Hardware in the cervicothoracic region. Mild cardiomegaly. Increased interstitial opacity compared to prior. No sizable effusion. No pneumothorax IMPRESSION: Mild cardiomegaly with increased interstitial opacity compared to prior, favored to represent edema, though atypical infection is an alternate consideration. Electronically Signed   By: Luke Bun M.D.   On: 04/03/2024 16:13   DG Chest 2 View Result Date: 04/02/2024 CLINICAL DATA:  Mid chest pain radiating to left arm. Left arm numbness. EXAM: CHEST - 2 VIEW COMPARISON:  Chest radiograph 02/18/2024, 02/17/2024, 01/21/2024 FINDINGS: Cardiac silhouette and mediastinal contours are within limits. The lungs are clear. No pleural effusion pneumothorax. Moderate multilevel degenerative disc changes of the thoracic spine. Partial visualization of cervical and upper thoracic spine posterior fusion hardware. IMPRESSION: No active cardiopulmonary disease. Electronically Signed   By: Tanda Lyons M.D.   On: 04/02/2024 15:55    Microbiology: No results found for this or any previous visit (from the past 240  hours).   Labs: Basic Metabolic Panel: Recent Labs  Lab 04/02/24 1518 04/03/24 1240 04/04/24 0441  NA 140 140 136  K 3.6 3.8 4.2  CL 105 109 98  CO2 23 25 26   GLUCOSE 285* 218* 236*  BUN 16 18 26*  CREATININE 1.33* 1.16 1.48*  CALCIUM  9.2 8.6* 9.0   Liver Function Tests: No results for input(s): AST, ALT, ALKPHOS, BILITOT, PROT, ALBUMIN in the last 168 hours. No results for input(s): LIPASE, AMYLASE in the last 168 hours. No results for input(s): AMMONIA in the last 168 hours. CBC: Recent Labs  Lab 04/02/24 1518 04/03/24 1240 04/04/24 0441  WBC 10.3 11.8* 14.1*  HGB 14.3 13.9 15.0  HCT 43.9 42.1 45.5  MCV 86.8 87.0 85.5  PLT 174 167 191   Cardiac Enzymes: No results for input(s): CKTOTAL, CKMB, CKMBINDEX, TROPONINI in the last 168 hours. BNP: BNP (last 3 results) Recent Labs    02/17/24 0452 04/02/24 1518 04/03/24 1240  BNP 300.0* 284.0* 320.0*    ProBNP (last 3 results) No results for input(s): PROBNP in the last 8760 hours.  CBG: Recent Labs  Lab 04/03/24 1705 04/03/24 2130 04/04/24 0808  GLUCAP 172* 300* 239*       Signed:  Devaughn KATHEE Ban MD.  [facility] 04/04/2024, 9:23 AM

## 2024-04-04 NOTE — Progress Notes (Signed)
 Nutrition Brief Note  Received consult for nutrition goals. Patient being discharged home today. Provided Heart Healthy Carbohydrate Consistent Nutrition Therapy handout in discharge instructions. Also made referral for outpatient diet education.  Suzen HUNT RD, LDN, CNSC Contact via secure chat. If unavailable, use group chat RD Inpatient.

## 2024-04-04 NOTE — Discharge Instructions (Signed)
 Heart Healthy, Consistent Carbohydrate Nutrition Therapy   A heart-healthy and consistent carbohydrate diet is recommended to manage heart disease and diabetes. To follow a heart-healthy and consistent carbohydrate diet, Eat a balanced diet with whole grains, fruits and vegetables, and lean protein sources.  Choose heart-healthy unsaturated fats. Limit saturated fats, trans fats, and cholesterol intake. Eat more plant-based or vegetarian meals using beans and soy foods for protein.  Eat whole, unprocessed foods to limit the amount of sodium (salt) you eat.  Choose a consistent amount of carbohydrate at each meal and snack. Limit refined carbohydrates especially sugar, sweets and sugar-sweetened beverages.  If you drink alcohol, do so in moderation: one serving per day (women) and two servings per day (men). o One serving is equivalent to 12 ounces beer, 5 ounces wine, or 1.5 ounces distilled spirits  Tips Tips for Choosing Heart-Healthy Fats Choose lean protein and low-fat dairy foods to reduce saturated fat intake. Saturated fat is usually found in animal-based protein and is associated with certain health risks. Saturated fat is the biggest contributor to raise low-density lipoprotein (LDL) cholesterol levels. Research shows that limiting saturated fat lowers unhealthy cholesterol levels. Eat no more than 7% of your total calories each day from saturated fat. Ask your RDN to help you determine how much saturated fat is right for you. There are many foods that do not contain large amounts of saturated fats. Swapping these foods to replace foods high in saturated fats will help you limit the saturated fat you eat and improve your cholesterol levels. You can also try eating more plant-based or vegetarian meals. Instead of. Try:  Whole milk, cheese, yogurt, and ice cream 1% or skim milk, low-fat cheese, non-fat yogurt, and low-fat ice cream  Fatty, marbled beef and pork Lean beef, pork, or venison   Poultry with skin Poultry without skin  Butter, stick margarine Reduced-fat, whipped, or liquid spreads  Coconut oil, palm oil Liquid vegetable oils: corn, canola, olive, soybean and safflower oils   Avoid foods that contain trans fats. Trans fats increase levels of LDL-cholesterol. Hydrogenated fat in processed foods is the main source of trans fats in foods.  Trans fats can be found in stick margarine, shortening, processed sweets, baked goods, some fried foods, and packaged foods made with hydrogenated oils. Avoid foods with "partially hydrogenated oil" on the ingredient list such as: cookies, pastries, baked goods, biscuits, crackers, microwave popcorn, and frozen dinners. Choose foods with heart healthy fats. Polyunsaturated and monounsaturated fat are unsaturated fats that may help lower your blood cholesterol level when used in place of saturated fat in your diet. Ask your RDN about taking a dietary supplement with plant sterols and stanols to help lower your cholesterol level. Research shows that substituting saturated fats with unsaturated fats is beneficial to cholesterol levels. Try these easy swaps: Instead of. Try:  Butter, stick margarine, or solid shortening Reduced-fat, whipped, or liquid spreads  Beef, pork, or poultry with skin Fish and seafood  Chips, crackers, snack foods Raw or unsalted nuts and seeds or nut butters Hummus with vegetables Avocado on toast  Coconut oil, palm oil Liquid vegetable oils: corn, canola, olive, soybean and safflower oils  Limit the amount of cholesterol you eat to less than 200 milligrams per day. Cholesterol is a substance carried through the bloodstream via lipoproteins, which are known as "transporters" of fat. Some body functions need cholesterol to work properly, but too much cholesterol in the bloodstream can damage arteries and build up blood vessel linings (  which can lead to heart attack and stroke). You should eat less than 200 milligrams  cholesterol per day. People respond differently to eating cholesterol. There is no test available right now that can figure out which people will respond more to dietary cholesterol and which will respond less. For individuals with high intake of dietary cholesterol, different types of increase (none, small, moderate, large) in LDL-cholesterol levels are all possible.  Food sources of cholesterol include egg yolks and organ meats such as liver, gizzards. Limit egg yolks to two to four per week and avoid organ meats like liver and gizzards to control cholesterol intake. Tips for Choosing Heart-Healthy Carbohydrates Consume a consistent amount of carbohydrate It is important to eat foods with carbohydrates in moderation because they impact your blood glucose level. Carbohydrates can be found in many foods such as: Grains (breads, crackers, rice, pasta, and cereals)  Starchy Vegetables (potatoes, corn, and peas)  Beans and legumes  Milk, soy milk, and yogurt  Fruit and fruit juice  Sweets (cakes, cookies, ice cream, jam and jelly) Your RDN will help you set a goal for how many carbohydrate servings to eat at your meals and snacks. For many adults, eating 3 to 5 servings of carbohydrate foods at each meal and 1 or 2 carbohydrate servings for each snack works well.  Check your blood glucose level regularly. It can tell you if you need to adjust when you eat carbohydrates. Choose foods rich in viscous (soluble) fiber Viscous, or soluble, is found in the walls of plant cells. Viscous fiber is found only in plant-based foods. Eating foods with fiber helps to lower your unhealthy cholesterol and keep your blood glucose in range  Rich sources of viscous fiber include vegetables (asparagus, Brussels sprouts, sweet potatoes, turnips) fruit (apricots, mangoes, oranges), legumes, and whole grains (barley, oats, and oat bran).  As you increase your fiber intake gradually, also increase the amount of water you  drink. This will help prevent constipation.  If you have difficulty achieving this goal, ask your RDN about fiber laxatives. Choose fiber supplements made with viscous fibers such as psyllium seed husks or methylcellulose to help lower unhealthy cholesterol.  Limit refined carbohydrates  There are three types of carbohydrates: starches, sugar, and fiber. Some carbohydrates occur naturally in food, like the starches in rice or corn or the sugars in fruits and milk. Refined carbohydrates--foods with high amounts of simple sugars--can raise triglyceride levels. High triglyceride levels are associated with coronary heart disease. Some examples of refined carbohydrate foods are table sugar, sweets, and beverages sweetened with added sugar. Tips for Reducing Sodium (Salt) Although sodium is important for your body to function, too much sodium can be harmful for people with high blood pressure. As sodium and fluid buildup in your tissues and bloodstream, your blood pressure increases. High blood pressure may cause damage to other organs and increase your risk for a stroke. Even if you take a pill for blood pressure or a water pill (diuretic) to remove fluid, it is still important to have less salt in your diet. Ask your doctor and RDN what amount of sodium is right for you. Avoid processed foods. Eat more fresh foods.  Fresh fruits and vegetables are naturally low in sodium, as well as frozen vegetables and fruits that have no added juices or sauces.  Fresh meats are lower in sodium than processed meats, such as bacon, sausage, and hotdogs. Read the nutrition label or ask your butcher to help you find a  fresh meat that is low in sodium. Eat less salt--at the table and when cooking.  A single teaspoon of table salt has 2,300 mg of sodium.  Leave the salt out of recipes for pasta, casseroles, and soups.  Ask your RDN how to cook your favorite recipes without sodium Be a smart shopper.  Look for food packages  that say "salt-free" or "sodium-free." These items contain less than 5 milligrams of sodium per serving.  "Very low-sodium" products contain less than 35 milligrams of sodium per serving.  "Low-sodium" products contain less than 140 milligrams of sodium per serving.  Beware for "Unsalted" or "No Added Salt" products. These items may still be high in sodium. Check the nutrition label. Add flavors to your food without adding sodium.  Try lemon juice, lime juice, fruit juice or vinegar.  Dry or fresh herbs add flavor. Try basil, bay leaf, dill, rosemary, parsley, sage, dry mustard, nutmeg, thyme, and paprika.  Pepper, red pepper flakes, and cayenne pepper can add spice t your meals without adding sodium. Hot sauce contains sodium, but if you use just a drop or two, it will not add up to much.  Buy a sodium-free seasoning blend or make your own at home. Additional Lifestyle Tips Achieve and maintain a healthy weight. Talk with your RDN or your doctor about what is a healthy weight for you. Set goals to reach and maintain that weight.  To lose weight, reduce your calorie intake along with increasing your physical activity. A weight loss of 10 to 15 pounds could reduce LDL-cholesterol by 5 milligrams per deciliter. Participate in physical activity. Talk with your health care team to find out what types of physical activity are best for you. Set a plan to get about 30 minutes of exercise on most days.  Foods Recommended Food Group Foods Recommended  Grains Whole grain breads and cereals, including whole wheat, barley, rye, buckwheat, corn, teff, quinoa, millet, amaranth, brown or wild rice, sorghum, and oats Pasta, especially whole wheat or other whole grain types  [employer] [employer], quinoa or wild rice Whole grain crackers, bread, rolls, pitas Home-made bread with reduced-sodium baking soda  Protein Foods Lean cuts of beef and pork (loin, leg, round, extra lean hamburger)  Skinless [employer] photographer  and other wild game Dried beans and peas Nuts and nut butters Meat alternatives made with soy or textured vegetable protein  Egg whites or egg substitute Cold cuts made with lean meat or soy protein  Dairy Nonfat (skim), low-fat, or 1%-fat milk  Nonfat or low-fat yogurt or cottage cheese Fat-free and low-fat cheese  Vegetables Fresh, frozen, or canned vegetables without added fat or salt   Fruits Fresh, frozen, canned, or dried fruit   Oils Unsaturated oils (corn, olive, peanut, soy, sunflower, canola)  Soft or liquid margarines and vegetable oil spreads  Salad dressings Seeds and nuts  Avocado   Foods Not Recommended Food Group Foods Not Recommended  Grains Breads or crackers topped with salt Cereals (hot or cold) with more than 300 mg sodium per serving Biscuits, cornbread, and other "quick" breads prepared with baking soda Bread crumbs or stuffing mix from a store High-fat bakery products, such as doughnuts, biscuits, croissants, danish pastries, pies, cookies Instant cooking foods to which you add hot water and stir--potatoes, noodles, rice, etc. Packaged starchy foods--seasoned noodle or rice dishes, stuffing mix, macaroni and cheese dinner Snacks made with partially hydrogenated oils, including chips, cheese puffs, snack mixes, regular crackers, butter-flavored popcorn  Protein Foods  Higher-fat cuts of meats (ribs, t-bone steak, regular hamburger) Bacon, sausage, or hot dogs Cold cuts, such as salami or bologna, deli meats, cured meats, corned beef Organ meats (liver, brains, gizzards, sweetbreads) Poultry with skin Fried or smoked meat, poultry, and fish Whole eggs and egg yolks (more than 2-4 per week) Salted legumes, nuts, seeds, or nut/seed butters Meat alternatives with high levels of sodium (>300 mg per serving) or saturated fat (>5 g per serving)  Dairy Whole milk,?2% fat milk, buttermilk Whole milk yogurt or ice cream Cream Half-&-half Cream cheese Sour  cream Cheese  Vegetables Canned or frozen vegetables with salt, fresh vegetables prepared with salt, butter, cheese, or cream sauce Fried vegetables Pickled vegetables such as olives, pickles, or sauerkraut  Fruits Fried fruits Fruits served with butter or cream  Oils Butter, stick margarine, shortening Partially hydrogenated oils or trans fats Tropical oils (coconut, palm, palm kernel oils)  Other Candy, sugar sweetened soft drinks and desserts Salt, sea salt, garlic salt, and seasoning mixes containing salt Bouillon cubes Ketchup, barbecue sauce, Worcestershire sauce, soy sauce, teriyaki sauce Miso Salsa Pickles, olives, relish   Heart Healthy Consistent Carbohydrate Vegetarian (Lacto-Ovo) Sample 1-Day Menu  Breakfast 1 cup oatmeal, cooked (2 carbohydrate servings)   cup blueberries (1 carbohydrate serving)  11 almonds, without salt  1 cup 1% milk (1 carbohydrate serving)  1 cup coffee  Morning Snack 1 cup fat-free plain yogurt (1 carbohydrate serving)  Lunch 1 whole wheat bun (1 carbohydrate servings)  1 black bean burger (1 carbohydrate servings)  1 slice cheddar cheese, low sodium  2 slices tomatoes  2 leaves lettuce  1 teaspoon mustard  1 small pear (1 carbohydrate servings)  1 cup green tea, unsweetened  Afternoon Snack 1/3 cup trail mix with nuts, seeds, and raisins, without salt (1 carbohydrate servinga)  Evening Meal  cup meatless chicken  2/3 cup brown rice, cooked (2 carbohydrate servings)  1 cup broccoli, cooked (2/3 carbohydrate serving)   cup carrots, cooked (1/3 carbohydrate serving)  2 teaspoons olive oil  1 teaspoon balsamic vinegar  1 whole wheat dinner roll (1 carbohydrate serving)  1 teaspoon margarine, soft, tub  1 cup 1% milk (1 carbohydrate serving)  Evening Snack 1 extra small banana (1 carbohydrate serving)  1 tablespoon peanut butter   Heart Healthy Consistent Carbohydrate Vegan Sample 1-Day Menu  Breakfast 1 cup oatmeal, cooked (2  carbohydrate servings)   cup blueberries (1 carbohydrate serving)  11 almonds, without salt  1 cup soymilk fortified with calcium, vitamin B12, and vitamin D  1 cup coffee  Morning Snack 6 ounces soy yogurt (1 carbohydrate servings)  Lunch 1 whole wheat bun(1 carbohydrate servings)  1 black bean burger (1 carbohydrate serving)  2 slices tomatoes  2 leaves lettuce  1 teaspoon mustard  1 small pear (1 carbohydrate servings)  1 cup green tea, unsweetened  Afternoon Snack 1/3 cup trail mix with nuts, seeds, and raisins, without salt (1 carbohydrate servings)  Evening Meal  cup meatless chicken  2/3 cup brown rice, cooked (2 carbohydrate servings)  1 cup broccoli, cooked (2/3 carbohydrate serving)   cup carrots, cooked (1/3 carbohydrate serving)  2 teaspoons olive oil  1 teaspoon balsamic vinegar  1 whole wheat dinner roll (1 carbohydrate serving)  1 teaspoon margarine, soft, tub  1 cup soymilk fortified with calcium, vitamin B12, and vitamin D  Evening Snack 1 extra small banana (1 carbohydrate serving)  1 tablespoon peanut butter    Heart Healthy Consistent Carbohydrate Sample  1-Day Menu  Breakfast 1 cup cooked oatmeal (2 carbohydrate servings)  3/4 cup blueberries (1 carbohydrate serving)  1 ounce almonds  1 cup skim milk (1 carbohydrate serving)  1 cup coffee  Morning Snack 1 cup sugar-free nonfat yogurt (1 carbohydrate serving)  Lunch 2 slices whole-wheat bread (2 carbohydrate servings)  2 ounces lean [place] breast  1 ounce low-fat Swiss cheese  1 teaspoon mustard  1 slice tomato  1 lettuce leaf  1 small pear (1 carbohydrate serving)  1 cup skim milk (1 carbohydrate serving)  Afternoon Snack 1 ounce trail mix with unsalted nuts, seeds, and raisins (1 carbohydrate serving)  Evening Meal 3 ounces salmon  2/3 cup cooked brown rice (2 carbohydrate servings)  1 teaspoon soft margarine  1 cup cooked broccoli with 1/2 cup cooked carrots (1 carbohydrate serving  Carrots,  cooked, boiled, drained, without salt  1 cup lettuce  1 teaspoon olive oil with vinegar for dressing  1 small whole grain roll (1 carbohydrate serving)  1 teaspoon soft margarine  1 cup unsweetened tea  Evening Snack 1 extra-small banana (1 carbohydrate serving)  Copyright 2020  [employer]. All rights reserved.

## 2024-04-04 NOTE — Plan of Care (Signed)

## 2024-04-09 ENCOUNTER — Other Ambulatory Visit: Payer: Self-pay | Admitting: Family Medicine

## 2024-04-09 ENCOUNTER — Other Ambulatory Visit: Payer: Self-pay

## 2024-04-09 DIAGNOSIS — M1A09X0 Idiopathic chronic gout, multiple sites, without tophus (tophi): Secondary | ICD-10-CM

## 2024-04-10 ENCOUNTER — Other Ambulatory Visit: Payer: Self-pay

## 2024-04-10 NOTE — Telephone Encounter (Signed)
 Requested medication (s) are due for refill today: -  Requested medication (s) are on the active medication list: yes  Last refill:  11/11/23 #90  Future visit scheduled: no  Notes to clinic:  abnormal lab work   Requested Prescriptions  Pending Prescriptions Disp Refills   allopurinol  (ZYLOPRIM ) 300 MG tablet 90 tablet 1    Sig: Take 1 tablet (300 mg total) by mouth daily with 100 mg tablet for a total of 400mg  daily for gout.     Endocrinology:  Gout Agents - allopurinol  Failed - 04/10/2024  2:46 PM      Failed - Uric Acid in normal range and within 360 days    Uric Acid, Serum  Date Value Ref Range Status  01/20/2024 9.3 (H) 3.7 - 8.6 mg/dL Final    Comment:    Performed at [facility], [address] [place], [place] [address]         Failed - Cr in normal range and within 360 days    Creat  Date Value Ref Range Status  02/10/2024 1.30 0.70 - 1.30 mg/dL Final   Creatinine, Ser  Date Value Ref Range Status  04/04/2024 1.48 (H) 0.61 - 1.24 mg/dL Final   Creatinine, Urine  Date Value Ref Range Status  03/21/2023 8 (L) 20 - 320 mg/dL Final         Passed - Valid encounter within last 12 months    Recent Outpatient Visits           1 month ago Hospital discharge follow-up   [facility] Leavy Mole, PA-C   2 months ago Exacerbation of persistent asthma, unspecified asthma severity   [facility] Leavy Mole, PA-C   2 months ago Encounter for examination following treatment at hospital   [facility] Leavy Mole, PA-C   3 months ago Acute mid back pain   [facility] Gareth Clarity F, FNP   4 months ago Uncontrolled diabetes mellitus with hyperglycemia, with long-term current use of insulin  [facility])   [facility] Leavy Mole, PA-C       Future Appointments             In 1 month Dunn, Bernardino HERO, PA-C [facility]  at [place]   In 1 month Dartha Ernst, MD [facility] Endocrinology            Passed - CBC within normal limits and completed in the last 12 months    WBC  Date Value Ref Range Status  04/04/2024 14.1 (H) 4.0 - 10.5 K/uL Final   RBC  Date Value Ref Range Status  04/04/2024 5.32 4.22 - 5.81 MIL/uL Final   Hemoglobin  Date Value Ref Range Status  04/04/2024 15.0 13.0 - 17.0 g/dL Final  98/86/7974 82.7 13.0 - 17.7 g/dL Final   HCT  Date Value Ref Range Status  04/04/2024 45.5 39.0 - 52.0 % Final   Hematocrit  Date Value Ref Range Status  10/28/2023 51.6 (H) 37.5 - 51.0 % Final   MCHC  Date Value Ref Range Status  04/04/2024 33.0 30.0 - 36.0 g/dL Final   [facility]  Date Value Ref Range Status  04/04/2024 28.2 26.0 - 34.0 pg Final   MCV  Date Value Ref Range Status  04/04/2024 85.5 80.0 - 100.0 fL Final  10/28/2023 87 79 - 97 fL Final   No results found for: PLTCOUNTKUC, LABPLAT,  POCPLA RDW  Date Value Ref Range Status  04/04/2024 14.6 11.5 - 15.5 % Final  10/28/2023 13.3 11.6 - 15.4 % Final

## 2024-04-12 ENCOUNTER — Encounter: Payer: Self-pay | Admitting: Intensive Care

## 2024-04-12 ENCOUNTER — Other Ambulatory Visit: Payer: Self-pay

## 2024-04-12 ENCOUNTER — Emergency Department

## 2024-04-12 ENCOUNTER — Emergency Department
Admission: EM | Admit: 2024-04-12 | Discharge: 2024-04-12 | Disposition: A | Discharge status: Home / Self Care | Attending: Emergency Medicine | Admitting: Emergency Medicine

## 2024-04-12 DIAGNOSIS — I110 Hypertensive heart disease with heart failure: Secondary | ICD-10-CM | POA: Insufficient documentation

## 2024-04-12 DIAGNOSIS — R0602 Shortness of breath: Secondary | ICD-10-CM | POA: Insufficient documentation

## 2024-04-12 DIAGNOSIS — R918 Other nonspecific abnormal finding of lung field: Secondary | ICD-10-CM | POA: Diagnosis not present

## 2024-04-12 DIAGNOSIS — I10 Essential (primary) hypertension: Secondary | ICD-10-CM | POA: Diagnosis not present

## 2024-04-12 DIAGNOSIS — R609 Edema, unspecified: Secondary | ICD-10-CM | POA: Diagnosis not present

## 2024-04-12 DIAGNOSIS — I5030 Unspecified diastolic (congestive) heart failure: Secondary | ICD-10-CM | POA: Diagnosis not present

## 2024-04-12 DIAGNOSIS — R739 Hyperglycemia, unspecified: Secondary | ICD-10-CM | POA: Diagnosis not present

## 2024-04-12 DIAGNOSIS — R079 Chest pain, unspecified: Secondary | ICD-10-CM | POA: Diagnosis not present

## 2024-04-12 DIAGNOSIS — I517 Cardiomegaly: Secondary | ICD-10-CM | POA: Diagnosis not present

## 2024-04-12 DIAGNOSIS — I509 Heart failure, unspecified: Secondary | ICD-10-CM

## 2024-04-12 DIAGNOSIS — J449 Chronic obstructive pulmonary disease, unspecified: Secondary | ICD-10-CM | POA: Insufficient documentation

## 2024-04-12 DIAGNOSIS — E119 Type 2 diabetes mellitus without complications: Secondary | ICD-10-CM | POA: Insufficient documentation

## 2024-04-12 DIAGNOSIS — T7840XA Allergy, unspecified, initial encounter: Secondary | ICD-10-CM | POA: Diagnosis not present

## 2024-04-12 DIAGNOSIS — R0789 Other chest pain: Secondary | ICD-10-CM | POA: Diagnosis not present

## 2024-04-12 LAB — CBC WITH DIFFERENTIAL/PLATELET
Abs Immature Granulocytes: 0.04 10*3/uL (ref 0.00–0.07)
Basophils Absolute: 0 10*3/uL (ref 0.0–0.1)
Basophils Relative: 1 %
Eosinophils Absolute: 0.1 10*3/uL (ref 0.0–0.5)
Eosinophils Relative: 2 %
HCT: 41.7 % (ref 39.0–52.0)
Hemoglobin: 13.8 g/dL (ref 13.0–17.0)
Immature Granulocytes: 1 %
Lymphocytes Relative: 15 %
Lymphs Abs: 1.2 10*3/uL (ref 0.7–4.0)
MCH: 28.6 pg (ref 26.0–34.0)
MCHC: 33.1 g/dL (ref 30.0–36.0)
MCV: 86.3 fL (ref 80.0–100.0)
Monocytes Absolute: 0.4 10*3/uL (ref 0.1–1.0)
Monocytes Relative: 6 %
Neutro Abs: 6.2 10*3/uL (ref 1.7–7.7)
Neutrophils Relative %: 75 %
Platelets: 179 10*3/uL (ref 150–400)
RBC: 4.83 MIL/uL (ref 4.22–5.81)
RDW: 14.3 % (ref 11.5–15.5)
WBC: 8 10*3/uL (ref 4.0–10.5)
nRBC: 0 % (ref 0.0–0.2)

## 2024-04-12 LAB — COMPREHENSIVE METABOLIC PANEL WITH GFR
ALT: 19 U/L (ref 0–44)
AST: 23 U/L (ref 15–41)
Albumin: 3.8 g/dL (ref 3.5–5.0)
Alkaline Phosphatase: 57 U/L (ref 38–126)
Anion gap: 11 (ref 5–15)
BUN: 15 mg/dL (ref 6–20)
CO2: 24 mmol/L (ref 22–32)
Calcium: 8.8 mg/dL — ABNORMAL LOW (ref 8.9–10.3)
Chloride: 105 mmol/L (ref 98–111)
Creatinine, Ser: 1.3 mg/dL — ABNORMAL HIGH (ref 0.61–1.24)
GFR, Estimated: >60 mL/min (ref >60)
Glucose, Bld: 305 mg/dL — ABNORMAL HIGH (ref 70–99)
Potassium: 3.7 mmol/L (ref 3.5–5.1)
Sodium: 140 mmol/L (ref 135–145)
Total Bilirubin: 0.8 mg/dL (ref 0.0–1.2)
Total Protein: 7 g/dL (ref 6.5–8.1)

## 2024-04-12 LAB — BLOOD GAS, VENOUS
Acid-Base Excess: 2.4 mmol/L — ABNORMAL HIGH (ref 0.0–2.0)
Bicarbonate: 27.2 mmol/L (ref 20.0–28.0)
O2 Saturation: 90.8 %
Patient temperature: 37
pCO2, Ven: 42 mmHg — ABNORMAL LOW (ref 44–60)
pH, Ven: 7.42 (ref 7.25–7.43)
pO2, Ven: 58 mmHg — ABNORMAL HIGH (ref 32–45)

## 2024-04-12 LAB — TROPONIN I (HIGH SENSITIVITY): Troponin I (High Sensitivity): 30 ng/L — ABNORMAL HIGH (ref <18)

## 2024-04-12 LAB — BRAIN NATRIURETIC PEPTIDE: B Natriuretic Peptide: 197.9 pg/mL — ABNORMAL HIGH (ref 0.0–100.0)

## 2024-04-12 MED ORDER — FUROSEMIDE 10 MG/ML IJ SOLN
40.0000 mg | Freq: Once | INTRAMUSCULAR | Status: AC
  Administered 2024-04-12: 40 mg via INTRAVENOUS
  Filled 2024-04-12: qty 4

## 2024-04-12 MED ORDER — IPRATROPIUM-ALBUTEROL 0.5-2.5 (3) MG/3ML IN SOLN
3.0000 mL | Freq: Once | RESPIRATORY_TRACT | Status: AC
  Administered 2024-04-12: 3 mL via RESPIRATORY_TRACT
  Filled 2024-04-12: qty 3

## 2024-04-12 NOTE — ED Provider Notes (Signed)
 Patient reevaluated, he reports he was feeling quite well after IV Lasix .  He does not feel that he needs to stay in the hospital, he is comfortable with discharge, states he will be compliant with his fluid pills.  Strict return precautions, he agrees with this plan.   Arlander Charleston, MD 04/12/24 [phone]

## 2024-04-12 NOTE — ED Provider Notes (Signed)
 [facility] Provider Note    Event Date/Time   First MD Initiated Contact with Patient 04/12/24 1434     (approximate)   History   Chief Complaint Respiratory Distress   HPI  Cory Rivas is a 58 y.o. male with past medical history of hypertension, hyperlipidemia, diabetes, diastolic CHF, COPD, and gout who presents to the ED complaining of shortness of breath.  Patient reports that over the course of the day today he has been getting more out of breath than usual while walking.  He was then watching TV just prior to arrival when he had a coughing fit and became even more short of breath.  He describes some associated pressure in his chest and EMS reports an oxygen saturation of 92% on room air at the time of their arrival.  He was given a loading dose of aspirin  and placed on CPAP, states that he feels much better on arrival to the ED with resolution of his chest pain.  He does report some ongoing difficulty breathing, has not noticed any pain or swelling in his legs.     Physical Exam   Triage Vital Signs: ED Triage Vitals  Encounter Vitals Group     BP --      Girls Systolic BP Percentile --      Girls Diastolic BP Percentile --      Boys Systolic BP Percentile --      Boys Diastolic BP Percentile --      Pulse Rate 04/12/24 1432 87     Resp 04/12/24 1432 (!) 25     Temp 04/12/24 1432 98.5 F (36.9 C)     Temp Source 04/12/24 1432 Oral     SpO2 04/12/24 1432 95 %     Weight 04/12/24 1434 226 lb 3.1 oz (102.6 kg)     Height 04/12/24 1434 5' 4 (1.626 m)     Head Circumference --      Peak Flow --      Pain Score 04/12/24 1433 0     Pain Loc --      Pain Education --      Exclude from Growth Chart --     Most recent vital signs: Vitals:   04/12/24 1432 04/12/24 1436  BP:  (!) 154/80  Pulse: 87   Resp: (!) 25   Temp: 98.5 F (36.9 C)   SpO2: 95%     Constitutional: Alert and oriented. Eyes: Conjunctivae are normal. Head:  Atraumatic. Nose: No congestion/rhinnorhea. Mouth/Throat: Mucous membranes are moist.  Cardiovascular: Normal rate, regular rhythm. Grossly normal heart sounds.  2+ radial pulses bilaterally. Respiratory: Mildly tachypneic with normal respiratory effort.  No retractions. Lung sounds faint bilaterally. Gastrointestinal: Soft and nontender. No distention. Musculoskeletal: No lower extremity tenderness nor edema.  Neurologic:  Normal speech and language. No gross focal neurologic deficits are appreciated.    ED Results / Procedures / Treatments   Labs (all labs ordered are listed, but only abnormal results are displayed) Labs Reviewed  BLOOD GAS, VENOUS - Abnormal; Notable for the following components:      Result Value   pCO2, Ven 42 (*)    pO2, Ven 58 (*)    Acid-Base Excess 2.4 (*)    All other components within normal limits  CBC WITH DIFFERENTIAL/PLATELET  COMPREHENSIVE METABOLIC PANEL WITH GFR  BRAIN NATRIURETIC PEPTIDE  TROPONIN I (HIGH SENSITIVITY)     EKG  ED ECG REPORT I, Carlin Palin, the attending  physician, personally viewed and interpreted this ECG.   Date: 04/12/2024  EKG Time: 14:34  Rate: 93  Rhythm: normal sinus rhythm  Axis: Normal  Intervals:none  ST&T Change: Inferolateral T wave inversions, similar to previous  RADIOLOGY Chest x-ray reviewed and interpreted by me with pulmonary edema, no focal infiltrate noted.  PROCEDURES:  Critical Care performed: No  Procedures   MEDICATIONS ORDERED IN ED: Medications  ipratropium-albuterol  (DUONEB) 0.5-2.5 (3) MG/3ML nebulizer solution 3 mL (3 mLs Nebulization Given 04/12/24 1444)     IMPRESSION / MDM / ASSESSMENT AND PLAN / ED COURSE  I reviewed the triage vital signs and the nursing notes.                              58 y.o. male with past medical history of hypertension, hyperlipidemia, diabetes, diastolic CHF, COPD, and gout who presents to the ED complaining of difficulty breathing today  that got acutely worse just prior to arrival following a coughing spell.  Patient's presentation is most consistent with acute presentation with potential threat to life or bodily function.  Differential diagnosis includes, but is not limited to, ACS, PE, pneumonia, pneumothorax, COPD exacerbation, CHF exacerbation, anemia, electrolyte abnormality, AKI.  Patient in mild respiratory distress on arrival, EMS CPAP was removed and he is tachypneic but maintaining oxygen saturations on room air.  No obvious wheezing noted and no clear signs of fluid overload, EKG without evidence of arrhythmia or ischemia.  We will trial DuoNeb, chest x-ray and labs are pending at this time.  VBG reassuring, labs without significant anemia or leukocytosis.  Patient turned over to oncoming provider pending additional results and reassessment.      FINAL CLINICAL IMPRESSION(S) / ED DIAGNOSES   Final diagnoses:  Shortness of breath     Rx / DC Orders   ED Discharge Orders     None        Note:  This document was prepared using Dragon voice recognition software and may include unintentional dictation errors.   Willo Dunnings, MD 04/12/24 [phone]

## 2024-04-12 NOTE — ED Triage Notes (Signed)
 Patient arrived by [facility] from home. C/o sob. Arrived to ER with CPAP on.  Recently admitted at [facility] for CHF exacerbation. Also c/o chest pain. Patient reports he started coughing and then episode started  FD administered 324 aspirin   EMS vitals: 220/110 b/p 80s HR 100% cpap  EMS reports 92% RA and placed on non rebreather and pt reported no relief so EMS placed on cpap  History COPD. Used  inhalers at home with no relief. EMS reports crackling in lungs   Per patient he is allergic to nitro

## 2024-04-12 NOTE — ED Notes (Signed)
 Respiratory called to run VBG that was sent to lab.

## 2024-04-13 ENCOUNTER — Other Ambulatory Visit: Payer: Self-pay | Admitting: Family Medicine

## 2024-04-13 ENCOUNTER — Other Ambulatory Visit: Payer: Self-pay

## 2024-04-13 DIAGNOSIS — M1A09X0 Idiopathic chronic gout, multiple sites, without tophus (tophi): Secondary | ICD-10-CM

## 2024-04-14 ENCOUNTER — Other Ambulatory Visit: Payer: Self-pay

## 2024-04-14 ENCOUNTER — Encounter: Admitting: Family

## 2024-04-14 DIAGNOSIS — Z981 Arthrodesis status: Secondary | ICD-10-CM | POA: Diagnosis not present

## 2024-04-14 DIAGNOSIS — M79602 Pain in left arm: Secondary | ICD-10-CM | POA: Diagnosis not present

## 2024-04-14 DIAGNOSIS — M542 Cervicalgia: Secondary | ICD-10-CM | POA: Diagnosis not present

## 2024-04-14 MED ORDER — ALLOPURINOL 300 MG PO TABS
300.0000 mg | ORAL_TABLET | Freq: Every day | ORAL | 1 refills | Status: DC
  Filled 2024-04-14: qty 30, 30d supply, fill #0

## 2024-04-14 NOTE — Telephone Encounter (Signed)
 Requested medications are due for refill today.  Unsure - pt is no longer on allopurinol    Requested medications are on the active medications list.  no  Last refill.   Future visit scheduled.   no  Notes to clinic.  Unclear if pt is to still be taking this medication.     Requested Prescriptions  Pending Prescriptions Disp Refills   allopurinol  (ZYLOPRIM ) 300 MG tablet 90 tablet 1    Sig: Take 1 tablet (300 mg total) by mouth daily with 100 mg tablet for a total of 400mg  daily for gout.     There is no refill protocol information for this order

## 2024-04-15 ENCOUNTER — Other Ambulatory Visit: Payer: Self-pay

## 2024-04-15 ENCOUNTER — Other Ambulatory Visit: Payer: Self-pay | Admitting: Family Medicine

## 2024-04-15 DIAGNOSIS — M109 Gout, unspecified: Secondary | ICD-10-CM

## 2024-04-15 MED ORDER — ALLOPURINOL 100 MG PO TABS
100.0000 mg | ORAL_TABLET | Freq: Every day | ORAL | 5 refills | Status: DC
  Filled 2024-04-15 – 2024-04-16 (×3): qty 30, 30d supply, fill #0

## 2024-04-16 ENCOUNTER — Other Ambulatory Visit: Payer: Self-pay

## 2024-04-20 ENCOUNTER — Telehealth: Payer: Self-pay | Admitting: Family

## 2024-04-20 NOTE — Telephone Encounter (Signed)
 Called to confirm/remind patient of their appointment [facility] on [facility].   Appointment:   [] Confirmed  [x] Left mess   [] No answer/No voice mail  [] VM Full/unable to leave message  [] Phone not in service  Patient reminded to bring all medications and/or complete list.  Confirmed patient has transportation. Gave directions, instructed to utilize valet parking.

## 2024-04-20 NOTE — Progress Notes (Unsigned)
 [facility] Note    PCP: Leavy Mole, PA-C Primary Cardiologist: Redell Cave, MD/ Abigail Motto, [place]   Chief Complaint: shortness of breath   HPI:  Cory Rivas is a 58 y/o male with a history of DM, hyperlipidemia, tracheobronchomalacia, nonobstructive CAD, HTN, gout, COPD, asthma, CKD, dyslipidemia, tobacco use and chronic heart failure.   Echo 11/04/20: EF 60-65% with Grade I DD, mild LVH/ LAE and normal PA pressure of 31.6 mmHg  Admitted 08/23/22 due to dyspnea, productive cough of brown sputum, mild sore throat, fatigue, dizziness and chest soreness. Steroid burst given. Initially given IV lasix  with transition to oral diuretics. Echo 08/23/22: EF of 60-65% along with mild LVH & mild Cory. Lisinopril  held due to AKI. Discharged after 2 days. Admitted 11/22/22 due to acute on chronic HF. Weaned off of bipap.Was in the ED 03/01/23 due to neck pain after falling off his tractor after he hit a stump. He landed on his right side.Head CT/ cervical spine xrays were negative. Was in the ED 03/09/23 due to atypical chest pain. Chest CTA negative for PE. Was in the ED 03/22/23 due to nonspecific chest pain. Workup negative. Was in the ED 03/23/23 due to hyperglycemia with glucose of >400.    Admitted 05/27/23 due to acute onset of chest pain, shortness of breath and diaphoresis. Blood pressure was significantly elevated to 204/102 on admission. Troponins were mildly elevated up to 26. Cardiology was consulted and he underwent cardiac catheterization 8/14 which revealed non-obstructive coronary artery disease. Meds adjusted. Chest x-ray without acute cardiopulmonary process. Echo on 05/28/2023 showed an EF of 50 to 55%, no regional wall motion abnormalities, moderate LVH, grade 3 diastolic dysfunction, elevated left atrial pressure, normal RV systolic function and ventricular cavity size, mildly dilated left atrium, mild mitral regurgitation, aortic valve sclerosis without evidence of stenosis, and  an estimated right atrial pressure of 3 mmHg.   Admitted 08/19/23 with chest pain/ pressure for 2 days located in the front chest. Worse with deep breath. Has shortness of breath and cough along with this. Negative D-dimer. Troponin 23 => 22. 1 dose of IV Lasix  as BNP 143.9. Glucose 409. Was in the ED 08/31/23 with central chest pressure along with shortness of breath & cough. Labs reassuring with normal troponin. Negative D-dimer. CXR concerning for pneumonia so antibiotics provided.   Admitted 10/21/23 due to acute hypoxic respiratory failure secondary to COPD exacerbation and acute on chronic HFpEF. Found to have worsening CKD. High-sensitivity troponin 21 with a delta troponin of 20 subsequently trended to 19. BNP 331. D-dimer negative. Supplemental oxygen given. Initially given IV lasix  with transition to increased home torsemide . Lisinopril , spironolactone , and torsemide  were held due to AKI.    R/LHC 10/30/23:  Moderate LAD and OM1 disease, not significantly different compared to prior catheterization in 05/2023.  RFR of sequential ostial and proximal LAD stenosis at that time was not hemodynamically significant. Normal left and right heart filling pressures. Mild pulmonary hypertension. Normal Fick cardiac output/index.        Echo 11/12/23: EF 55-60%, normal RV, mild Cory  Admitted 01/20/24 with worsening cough, wheezing & shortness of breath. Gained 10 pounds in the last month.IV diuresed. Chest x-ray showed pulmonary congestion as well as chronic interstitial changes compatible with COPD. Meds changed.   Admitted 02/17/24 with cough, shortness of breath, orthopnea, exertional dyspnea, chest tightness, and leg swelling. Patient has gained 7-8 pounds since previous discharge dry weight. On admission, BNP was 300.0 and HS-troponin was 27. Chest  x-ray noted mild cardiomegaly and interstitial edema. Placed on bipap, given IV lasix . Torsemide , lisinopril  and bisoprolol  all held  Seen in [facility] 05/25 and had  lisinopril  switched to valsartan  40mg  daily and torsemide  40mg  daily resumed.   Seen in ED 04/02/24 due to chest tightness and SOB which resolved by the time of ED presentation. Labs unremarkable. Bisoprolol  increased to 10mg .   Admitted 04/03/24 with SOB which was worse w/ ambulation or sitting up. No improvement with nebulizer treatment. Pulmonary edema noted on CXR. Troponins mildly flat and stable. IV diuresed with improvement of symptoms. Remains off CPAP.   Was in the ED 04/12/24 due to SOB with associated chest pressure. Placed on CPAP by EMS and given ASA with resolution of chest pain. Since patient was feeling better, he declined admission.   He presents today for a HF follow-up visit with a chief complaint of minimal shortness of breath. Sleeping in hospital bed with [employer] elevated chronically. Has chronic neck pain with occasional numbness/ tingling down the left arm. Will be having neck MRI soon. Reports that he feels much better since he recent ED visit. He is wondering if this wasn't triggered by anxiety as he just had the 1 year anniversary of his mom's death.   Did not bring his medication packs and doesn't know what he's taking.   ROS: All systems negative except as listed in HPI, PMH and Problem List.  SH:  Social History   Socioeconomic History   Marital status: Divorced    Spouse name: Not on file   Number of children: 0   Years of education: 9   Highest education level: 9th grade  Occupational History   Occupation: disability  Tobacco Use   Smoking status: Every Day    Current packs/day: 0.00    Average packs/day: 1 pack/day for 37.0 years (37.0 ttl pk-yrs)    Types: Cigarettes    Start date: 10/19/1989    Last attempt to quit: 08/20/2022    Years since quitting: 1.6   Smokeless tobacco: Never   Tobacco comments:    30+ years hx smoking 2 ppd, stopped smoking briefly in April 2022 for a surgery and then restarted smoking about 1/2 ppd last 4-5 months         5-6cigs  daily- 04/10/2023  Vaping Use   Vaping status: Never Used  Substance and Sexual Activity   Alcohol use: No   Drug use: No   Sexual activity: Not Currently    Partners: Female  Other Topics Concern   Not on file  Social History Narrative   Lives with sister [name], brother in law [name]), and his mother - Willy Pinkerton   Social Drivers of Health   Financial Resource Strain: High Risk (01/21/2024)   Overall Financial Resource Strain (CARDIA)    Difficulty of Paying Living Expenses: Hard  Food Insecurity: No Food Insecurity (04/04/2024)   Hunger Vital Sign    Worried About Running Out of Food in the Last Year: Never true    Ran Out of Food in the Last Year: Never true  Transportation Needs: No Transportation Needs (04/04/2024)   PRAPARE - Administrator, Civil Service (Medical): No    Lack of Transportation (Non-Medical): No  Physical Activity: Insufficiently Active (12/06/2022)   Exercise Vital Sign    Days of Exercise per Week: 3 days    Minutes of Exercise per Session: 10 min  Stress: No Stress Concern Present (12/06/2022)   [employer] of Occupational  Health - Occupational Stress Questionnaire    Feeling of Stress : Only a little  Social Connections: Socially Isolated (02/17/2024)   Social Connection and Isolation Panel    Frequency of Communication with Friends and Family: More than three times a week    Frequency of Social Gatherings with Friends and Family: Twice a week    Attends Religious Services: Never    Database administrator or Organizations: No    Attends Banker Meetings: Never    Marital Status: Divorced  Catering manager Violence: Not At Risk (04/04/2024)   Humiliation, Afraid, Rape, and Kick questionnaire    Fear of Current or Ex-Partner: No    Emotionally Abused: No    Physically Abused: No    Sexually Abused: No    FH:  Family History  Problem Relation Age of Onset   Heart disease Mother    Diabetes Mother    Hyperlipidemia  Mother    Hypertension Mother    Heart attack Mother    Lung cancer Father    Hypertension Father    Stroke Father    AAA (abdominal aortic aneurysm) Maternal Grandmother    Heart attack Maternal Grandmother    Diabetes Maternal Grandfather     Past Medical History:  Diagnosis Date   Bulging of cervical intervertebral disc    CHF (congestive heart failure) (HCC)    COPD (chronic obstructive pulmonary disease) (HCC)    Diabetes mellitus without complication (HCC)    Gout    High cholesterol    Hypertension    Stress due to family tension 04/15/2019    Current Outpatient Medications  Medication Sig Dispense Refill   allopurinol  (ZYLOPRIM ) 100 MG tablet Take 1 tablet (100 mg total) by mouth daily with the 300mg  tablet for a total daily dose of 400mg . 30 tablet 5   allopurinol  (ZYLOPRIM ) 300 MG tablet Take 1 tablet (300 mg total) by mouth daily with 100 mg tablet for a total of 400mg  daily for gout. 90 tablet 1   amLODipine  (NORVASC ) 10 MG tablet Take 1 tablet (10 mg total) by mouth daily. 30 tablet 3   aspirin  EC 81 MG tablet Take 1 tablet (81 mg total) by mouth daily. Swallow whole. 90 tablet 3   baclofen  (LIORESAL ) 10 MG tablet Take 1 tablet (10 mg total) by mouth 3 (three) times daily as needed for muscle spasms. 90 tablet 3   bisoprolol  (ZEBETA ) 5 MG tablet Take 2 tablets (10 mg total) by mouth daily. 180 tablet 2   budesonide -formoterol  (SYMBICORT ) 160-4.5 MCG/ACT inhaler Inhale 2 puffs into the lungs 2 (two) times daily. 10.2 g 12   cromolyn  (OPTICROM ) 4 % ophthalmic solution Place 1 drop into both eyes 4 (four) times daily as needed. 10 mL 2   Dulaglutide  (TRULICITY ) 3 MG/0.5ML SOAJ Inject 3 mg into the skin once a week. 2 mL 10   DULoxetine  (CYMBALTA ) 30 MG capsule Take 2 capsules (60 mg total) by mouth every morning AND 1 capsule (30 mg total) every evening. 270 capsule 0   empagliflozin  (JARDIANCE ) 10 MG TABS tablet Take 1 tablet (10 mg total) by mouth daily. 90 tablet 1    Evolocumab  (REPATHA ) 140 MG/ML SOSY Inject 140 mg into the skin every 14 (fourteen) days. 2 mL 11   ezetimibe  (ZETIA ) 10 MG tablet Take 1 tablet (10 mg total) by mouth daily. 90 tablet 0   fluticasone  (FLONASE ) 50 MCG/ACT nasal spray Place 1 spray into both nostrils daily. 16 g  6   Glucagon  (GVOKE HYPOPEN  1-PACK) 1 MG/0.2ML SOAJ Inject 1 mg into the skin as needed. (Patient not taking: Reported on 04/03/2024) 1.8 mL 0   ipratropium-albuterol  (DUONEB) 0.5-2.5 (3) MG/3ML SOLN Take 3 mLs by nebulization every 6 (six) hours as needed. 180 mL 1   levocetirizine (XYZAL ) 5 MG tablet Take 1 tablet (5 mg total) by mouth every evening. 90 tablet 1   nicotine  (NICODERM CQ  - DOSED IN MG/24 HR) 7 mg/24hr patch Place 1 patch (7 mg total) onto the skin daily. 90 patch 1   nicotine  polacrilex (NICOTINE  MINI) 4 MG lozenge Take 1 lozenge (4 mg total) by mouth as needed. 100 tablet 0   pantoprazole  (PROTONIX ) 40 MG tablet Take 1 tablet (40 mg total) by mouth daily. 90 tablet 1   pregabalin  (LYRICA ) 50 MG capsule Take 1 capsule (50 mg total) by mouth 2 (two) times daily. 60 capsule 0   rosuvastatin  (CRESTOR ) 40 MG tablet Take 1 tablet (40 mg total) by mouth daily. 90 tablet 0   spironolactone  (ALDACTONE ) 25 MG tablet Take 1 tablet (25 mg total) by mouth daily. 90 tablet 3   Tiotropium Bromide  Monohydrate (SPIRIVA  RESPIMAT) 2.5 MCG/ACT AERS Inhale 2 puffs into the lungs daily. 4 g 11   torsemide  (DEMADEX ) 20 MG tablet Take 2 tablets (40 mg total) by mouth daily. 60 tablet 6   valsartan  (DIOVAN ) 80 MG tablet Take 1 tablet (80 mg total) by mouth daily. 30 tablet 5   VENTOLIN  HFA 108 (90 Base) MCG/ACT inhaler Inhale 2 puffs into the lungs every 6 (six) hours as needed for wheezing or shortness of breath. 18 g 2   No current facility-administered medications for this visit.   Vitals:   04/21/24 1407 04/21/24 1411  BP: (!) 171/77 (!) 169/84  Pulse: 64   SpO2: 97%   Weight: 228 lb 2 oz (103.5 kg)    Wt Readings from  Last 3 Encounters:  04/21/24 228 lb 2 oz (103.5 kg)  04/12/24 226 lb 3.1 oz (102.6 kg)  04/04/24 226 lb 3.1 oz (102.6 kg)   Lab Results  Component Value Date   CREATININE 1.30 (H) 04/12/2024   CREATININE 1.48 (H) 04/04/2024   CREATININE 1.16 04/03/2024    PHYSICAL EXAM:  General: Well appearing. No resp difficulty HEENT: normal Neck: supple, no JVD Cor: Regular rhythm, rate. No rubs, gallops or murmurs Lungs: clear Abdomen: soft, nontender, nondistended. Extremities: no cyanosis, clubbing, rash, edema Neuro: alert & oriented X 3. Moves all 4 extremities w/o difficulty. Affect pleasant   ECG: not done   ASSESSMENT & PLAN:  1: NICM with preserved ejection fraction- - suscpect due to HTN and OSA - NYHA class II - euvolemic - weighing daily; reminded to call for an overnight weight gain of >2 pounds or a weekly weight gain of > 5 pounds - weight down 3 pounds from last visit here 5 weeks ago - Echo 11/04/20: EF 60-65% with Grade I DD, mild LVH/ LAE and normal PA pressure of 31.6 mmHg - Echo 08/23/22: EF of 60-65% along with mild LVH & mild Cory. - Echo 05/28/23: EF 50-55% with moderate LVH, Grade III DD and mild Cory - Echo 11/12/23: EF 55-60%, normal RV, mild Cory - continue bisoprolol  10mg  daily - continue jardiance  10mg  every day - continue torsemide  40mg  daily  - continue spironolactone  25mg  daily - continue valsartan  80mg  daily - BNP 04/12/24 was 197.9 - emphasized, again, the importance of bringing medication packs to every visit  because he was asking about a little blue pill  2: HTN- - BP 169/84 - saw PCP Arla) 05/25 - BMET 04/12/24 reviewed: sodium 140, potassium 3.7, creatinine 1.3 & GFR >60 - BMET today  3: DM- - A1c 11/28/23 was 7.9% - saw endocrinology ([place]inehart) 07/24 - 03/21/23 microalbumin/ creat ratio was 1338  4: Reactive airway disease- - saw pulmonology (Dgayli) 06/24 - using spiriva   - has not smoked in the last 2-3 weeks - CPAP was returned as he  wasn't wearing it long enough each night - has pulm appt 08/25; needs sleep apnea addressed or fluid retention will most likely continue  5: Hyperlipidemia- - continue ezetimibe  10mg  daily - continue repatha  every 2 weeks - continue rosuvastatin  40mg  daily - lipid panel 11/15/23 showed LDL 101, triglycerides 209 (both improving) - lipid panel today  6: CAD- - saw cardiology (Dunn) 04/25 - continue ezetimibe  10mg  daily - continue rosuvastatin  40mg  daily - lipid panel today - R/LHC 10/30/23:    Moderate LAD and OM1 disease, not significantly different compared to prior catheterization in 05/2023.  RFR of sequential ostial and proximal LAD stenosis at that time was not hemodynamically significant.   Normal left and right heart filling pressures.   Mild pulmonary hypertension.   Normal Fick cardiac output/index.   Return in 3 months, sooner if needed.   Ellouise DELENA Class, FNP 04/20/24

## 2024-04-21 ENCOUNTER — Other Ambulatory Visit
Admission: RE | Admit: 2024-04-21 | Discharge: 2024-04-21 | Disposition: A | Discharge status: Home / Self Care | Source: Ambulatory Visit | Attending: Family | Admitting: Family

## 2024-04-21 ENCOUNTER — Ambulatory Visit: Payer: Self-pay | Admitting: Family

## 2024-04-21 ENCOUNTER — Ambulatory Visit: Admitting: Family

## 2024-04-21 ENCOUNTER — Other Ambulatory Visit: Payer: Self-pay

## 2024-04-21 ENCOUNTER — Encounter: Payer: Self-pay | Admitting: Family

## 2024-04-21 VITALS — BP 169/84 | HR 64 | Wt 228.1 lb

## 2024-04-21 DIAGNOSIS — I10 Essential (primary) hypertension: Secondary | ICD-10-CM | POA: Diagnosis not present

## 2024-04-21 DIAGNOSIS — E119 Type 2 diabetes mellitus without complications: Secondary | ICD-10-CM

## 2024-04-21 DIAGNOSIS — E782 Mixed hyperlipidemia: Secondary | ICD-10-CM

## 2024-04-21 DIAGNOSIS — I5032 Chronic diastolic (congestive) heart failure: Secondary | ICD-10-CM

## 2024-04-21 DIAGNOSIS — I2510 Atherosclerotic heart disease of native coronary artery without angina pectoris: Secondary | ICD-10-CM | POA: Diagnosis not present

## 2024-04-21 DIAGNOSIS — Z794 Long term (current) use of insulin: Secondary | ICD-10-CM | POA: Diagnosis not present

## 2024-04-21 DIAGNOSIS — J45909 Unspecified asthma, uncomplicated: Secondary | ICD-10-CM

## 2024-04-21 LAB — BASIC METABOLIC PANEL WITH GFR
Anion gap: 10 (ref 5–15)
BUN: 21 mg/dL — ABNORMAL HIGH (ref 6–20)
CO2: 27 mmol/L (ref 22–32)
Calcium: 9.9 mg/dL (ref 8.9–10.3)
Chloride: 100 mmol/L (ref 98–111)
Creatinine, Ser: 1.5 mg/dL — ABNORMAL HIGH (ref 0.61–1.24)
GFR, Estimated: 54 mL/min — ABNORMAL LOW (ref >60)
Glucose, Bld: 260 mg/dL — ABNORMAL HIGH (ref 70–99)
Potassium: 4.2 mmol/L (ref 3.5–5.1)
Sodium: 137 mmol/L (ref 135–145)

## 2024-04-21 LAB — LIPID PANEL
Cholesterol: 193 mg/dL (ref 0–200)
HDL: 47 mg/dL (ref >40)
LDL Cholesterol: 83 mg/dL (ref 0–99)
Total CHOL/HDL Ratio: 4.1 ratio
Triglycerides: 314 mg/dL — ABNORMAL HIGH (ref <150)
VLDL: 63 mg/dL — ABNORMAL HIGH (ref 0–40)

## 2024-04-21 NOTE — Patient Instructions (Signed)
 Medication Changes:  No medication changes.   Lab Work:  Go over to the [facility]. Go pass the gift shop and have your blood work completed.  We will only call you if the results are abnormal or if the provider would like to make medication changes.    Follow-Up in: 3 months with [name], FNP.  At the [facility], you and your health needs are our priority. We have a designated team specialized in the treatment of Heart Failure. This Care Team includes your primary Heart Failure Specialized Cardiologist (physician), Advanced Practice Providers (APPs- Physician Assistants and Nurse Practitioners), and Pharmacist who all work together to provide you with the care you need, when you need it.   You may see any of the following providers on your designated Care Team at your next follow up:  Dr. Toribio Fuel Dr. Ezra Shuck Dr. Ria Commander Dr. Odis Brownie [name], FNP Jaun Bash, RPH-CPP  Please be sure to bring in all your medications bottles to every appointment.   Need to Contact Us :  If you have any questions or concerns before your next appointment please send us  a message through [place] or call our office at [phone].    TO LEAVE A MESSAGE FOR THE NURSE SELECT OPTION 2, PLEASE LEAVE A MESSAGE INCLUDING: YOUR NAME DATE OF BIRTH CALL BACK NUMBER REASON FOR CALL**this is important as we prioritize the call backs  YOU WILL RECEIVE A CALL BACK THE SAME DAY AS LONG AS YOU CALL BEFORE 4:00 PM

## 2024-05-04 ENCOUNTER — Other Ambulatory Visit: Payer: Self-pay

## 2024-05-06 ENCOUNTER — Encounter: Payer: Self-pay | Admitting: Family Medicine

## 2024-05-06 ENCOUNTER — Other Ambulatory Visit: Payer: Self-pay

## 2024-05-07 ENCOUNTER — Other Ambulatory Visit: Payer: Self-pay

## 2024-05-11 NOTE — Progress Notes (Deleted)
 Cardiology Office Note    Date:  05/11/2024   ID:  Cory Rivas, DOB 09-11-1966, MRN [id number]  PCP:  Leavy Mole, PA-C  Cardiologist:  Redell Cave, MD  Electrophysiologist:  None   Chief Complaint: Follow-up  History of Present Illness:   Cory Rivas is a 58 y.o. male with history of nonobstructive CAD, HFpEF, CKD stage IIIa, IDDM, HTN, HLD, cervical myelopathy, idiopathic chronic gout, tobacco use for 35+years, asthma, obesity with OSA and tracheobronchomalacia who presents for hospital follow-up.   In the setting of preoperative cardiac risk stratification for murmur he underwent Lexiscan  MPI in 09/2020 which showed no significant ischemia or coronary artery calcification on CT imaging.  Echo in 10/2020 showed an EF of 60 to 65%, no regional wall motion abnormalities, grade 1 diastolic dysfunction, and no significant valvular abnormalities.  Repeat echo in 08/2022 showed an EF of 60 to 65%, no regional wall motion normalities, mild LVH, grade 2 diastolic dysfunction, normal RV systolic function and ventricular cavity size mild mitral regurgitation, and aortic valve sclerosis without evidence of stenosis.   He was admitted to the hospital in 05/2023 with chest pain and shortness of breath with diaphoresis after eating dinner.  He was markedly hypertensive upon arrival with a blood pressure of 210/103.  High-sensitivity troponin 26 trending to 24.  BNP 199.  Chest x-ray without acute cardiopulmonary process.  Echo on 05/28/2023 showed an EF of 50 to 55%, no regional wall motion abnormalities, moderate LVH, grade 3 diastolic dysfunction, elevated left atrial pressure, normal RV systolic function and ventricular cavity size, mildly dilated left atrium, mild mitral regurgitation, aortic valve sclerosis without evidence of stenosis, and an estimated right atrial pressure of 3 mmHg.  R/LHC on 05/29/2023 showed moderate, nonobstructive CAD with sequential 50 to 60% ostial and proximal LAD  stenoses that were not hemodynamically significant (RFR 0.93).  There was also 40% ostial stenosis of a large OM1 branch.  The nondominant RCA was without significant disease.  Upper normal to mildly elevated left and right heart filling pressures.  Mild pulmonary hypertension.  Borderline low cardiac output/index.  Mild aortic stenosis.  Gentle diuresis and medical therapy/risk factor modification were recommended.     He was seen in hospital follow-up on 06/21/2023 and was without symptoms of angina or cardiac decompensation.  Weight was down 7 pounds by our scale.  He was adherent and tolerating cardiac medications.  He continued to smoke approximately 1/2 pack of cigarettes per day, was only taking 1 or 2 drags then letting the cigarette burn out.  Carvedilol  was titrated to 12.5 mg twice daily with continuation of Jardiance , lisinopril , spironolactone , and torsemide .     He was admitted to the hospital in early 08/2023 with hypertensive urgency with blood pressure 195/95 with associated chest pressure and dyspnea with cough productive of yellow sputum.  He reported eating some chicken wings leading up to this hospitalization.  Reported adherence to medications.  High-sensitivity troponin 23 with a delta troponin of 22.  D-dimer negative.  BNP 143.  Chest x-ray without acute cardiopulmonary process.  EKG showed sinus rhythm with occasional PACs with known inferolateral T wave inversion.  He reported good urine output with IV Lasix .  With improvement in blood pressure, and chest pressure also improved.  Mildly elevated troponin felt to be reflective of supply/demand ischemia in the context of volume overload and hypertensive urgency.     He was seen in the ED in 08/2023 with chest pain similar to his  prior evaluations and admissions.  High-sensitivity troponin negative x 1.  D-dimer negative.  Chest x-ray notable for patchy left lower opacity representative of possible atelectasis or pneumonia.  He was  discharged with antibiotic therapy.   He was admitted to [facility] from 1/6-10/24/2023 with acute hypoxic respiratory failure secondary to COPD exacerbation and acute on chronic HFpEF with admission complicated by acute on CKD stage IIIa.  There was concern for dietary indiscretion, patient denies this.  High-sensitivity troponin 21 with a delta troponin of 20 subsequently trended to 19.  BNP 331.  D-dimer negative.  He was treated with supplemental oxygen, IV Lasix  in the ER followed by increase in home torsemide  to 40 mg twice daily, Solu-Medrol , DuoNebs, and Rocephin  and azithromycin .  At time of discharge, his lisinopril , spironolactone , and torsemide  were held due to AKI.  At time of discharge, patient continued to note some dyspnea and chest discomfort along with dizziness.   He was seen in our office on 10/28/2023 reporting ongoing chest discomfort and dyspnea that was unchanged when compared to his hospital presentation earlier in the month.  His weight was down 6 pounds by our scale when compared to his visit in 08/2023.  Given ongoing symptoms he underwent R/LHC on 10/30/2023 that showed moderate LAD and OM1 disease, not significantly different when compared to cath in 05/2023.  RFR of sequential ostial and proximal LAD stenosis at that time and was not hemodynamically significant.  Normal left and right heart filling pressures, mild pulmonary hypertension, and normal cardiac output and index.  Escalation of medical therapy was recommended.  Echo on 11/12/2023 showed an EF of 55 to 60%, no regional wall motion abnormalities, normal LV diastolic function parameters, normal RV systolic function and ventricular cavity size mild mitral regurgitation, and aortic valve sclerosis without evidence of stenosis.   He was seen in follow-up in 10/2023 indicating he felt much better and reported adherence to pharmacotherapy.  He continued to smoke approximately quarter pack per day.  Carvedilol  was titrated to 37.5 mg twice  daily for further management of hypertension.   He was seen in the ER in 01/02/2024 and 02/02/2024 with atypical chest discomfort with stable mildly elevated troponin in the teens to 20s.  CTA of the chest, abdomen, pelvis in 12/2023 is showing no acute aortic syndrome, with pulmonary edema with trace bilateral pleural effusions, coronary calcification, and aortic atherosclerosis.  He was admitted to the hospital in 01/2024 with acute hypoxic respiratory failure secondary to COPD exacerbation and acute on chronic HFpEF.  High-sensitivity troponin mildly elevated and flat trending peaking at 25, consistent with baseline readings.  BNP normal at 152.  He was treated for COPD and diuresed with symptomatic improvement.  Carvedilol  was transitioned to bisoprolol .  Spironolactone  was discontinued with underlying renal dysfunction.   He followed up with the [facility] CHF clinic on 01/29/2024 and was advised to restart spironolactone  25 mg daily with continuing bisoprolol  5 mg, Jardiance  10 mg, lisinopril  40 mg, and torsemide  20 mg twice daily.  He was last seen in our office on 02/11/2024 and was doing well from a cardiac perspective, reporting dyspnea was improved from his hospitalization.  He was without significant swelling.  Weight was stable at home.  He continued to eat an intermittent diet high in sodium and continues to smoke.  Since I last saw him, he has been admitted to the hospital/seen in the ER 4 separate times for volume overload requiring IV diuresis complicated by AKI with most recent ER evaluation  in 03/2024 notable for improving BNP of 197 and mildly elevated troponin around 30 which is consistent with prior readings.  He has been nonadherent to CPAP.  Chest x-ray suggestive of interstitial edema versus atypical/viral infection.  He followed up with the [facility] CHF team earlier this month and wondered if some of his symptoms were related to anxiety as timing was around the 1 year anniversary of his  mom's passing.  He did not bring his medicines and was unclear what he was taking at that time.  At that visit it was recommended to continue bisoprolol  10 mg, Jardiance  10 mg, torsemide  40 mg daily, spironolactone  25 mg, and valsartan  80 mg.  ***   Labs independently reviewed: 04/2024 - potassium 4.2, BUN 21, serum creatinine 1.5, TC 193, TG 314, HDL 47, LDL 83 03/2024 - BNP 197, albumin 3.8, AST/ALT normal, Hgb 13.8, PLT 179 07/2023 - A1c 9.8 04/2023 - TSH normal  Past Medical History:  Diagnosis Date   Bulging of cervical intervertebral disc    CHF (congestive heart failure) (HCC)    COPD (chronic obstructive pulmonary disease) (HCC)    Diabetes mellitus without complication (HCC)    Gout    High cholesterol    Hypertension    Stress due to family tension 04/15/2019    Past Surgical History:  Procedure Laterality Date   BACK SURGERY     CARDIAC CATHETERIZATION     CHOLECYSTECTOMY     COLONOSCOPY WITH PROPOFOL  N/A 03/10/2020   Procedure: COLONOSCOPY WITH PROPOFOL ;  Surgeon: Jinny Carmine, MD;  Location: [facility] ENDOSCOPY;  Service: Endoscopy;  Laterality: N/A;   COLONOSCOPY WITH PROPOFOL  N/A 09/25/2023   Procedure: COLONOSCOPY WITH PROPOFOL ;  Surgeon: Jinny Carmine, MD;  Location: [facility] ENDOSCOPY;  Service: Endoscopy;  Laterality: N/A;   CORONARY PRESSURE/FFR STUDY N/A 05/29/2023   Procedure: CORONARY PRESSURE/FFR STUDY;  Surgeon: Mady Bruckner, MD;  Location: [facility] INVASIVE CV LAB;  Service: Cardiovascular;  Laterality: N/A;   POLYPECTOMY  09/25/2023   Procedure: POLYPECTOMY;  Surgeon: Jinny Carmine, MD;  Location: [facility] ENDOSCOPY;  Service: Endoscopy;;   RIGHT/LEFT HEART CATH AND CORONARY ANGIOGRAPHY N/A 05/29/2023   Procedure: RIGHT/LEFT HEART CATH AND CORONARY ANGIOGRAPHY;  Surgeon: Mady Bruckner, MD;  Location: [facility] INVASIVE CV LAB;  Service: Cardiovascular;  Laterality: N/A;   RIGHT/LEFT HEART CATH AND CORONARY ANGIOGRAPHY Bilateral 10/30/2023   Procedure: RIGHT/LEFT HEART  CATH AND CORONARY ANGIOGRAPHY;  Surgeon: Mady Bruckner, MD;  Location: [facility] INVASIVE CV LAB;  Service: Cardiovascular;  Laterality: Bilateral;    Current Medications: No outpatient medications have been marked as taking for the 05/12/24 encounter (Appointment) with Abigail Bernardino HERO, PA-C.    Allergies:   Oxycodone , Entresto  [sacubitril -valsartan ], Onion, and Nitroglycerin    Social History   Socioeconomic History   Marital status: Divorced    Spouse name: Not on file   Number of children: 0   Years of education: 9   Highest education level: 9th grade  Occupational History   Occupation: disability  Tobacco Use   Smoking status: Every Day    Current packs/day: 0.00    Average packs/day: 1 pack/day for 37.0 years (37.0 ttl pk-yrs)    Types: Cigarettes    Start date: 10/19/1989    Last attempt to quit: 08/20/2022    Years since quitting: 1.7   Smokeless tobacco: Never   Tobacco comments:    30+ years hx smoking 2 ppd, stopped smoking briefly in April 2022 for a surgery and then restarted smoking about 1/2 ppd last 4-5  months         5-6cigs daily- 04/10/2023  Vaping Use   Vaping status: Never Used  Substance and Sexual Activity   Alcohol use: No   Drug use: No   Sexual activity: Not Currently    Partners: Female  Other Topics Concern   Not on file  Social History Narrative   Lives with sister [name], brother in law [name]), and his mother - Teaghan Formica   Social Drivers of Health   Financial Resource Strain: High Risk (01/21/2024)   Overall Financial Resource Strain (CARDIA)    Difficulty of Paying Living Expenses: Hard  Food Insecurity: No Food Insecurity (04/04/2024)   Hunger Vital Sign    Worried About Running Out of Food in the Last Year: Never true    Ran Out of Food in the Last Year: Never true  Transportation Needs: No Transportation Needs (04/04/2024)   PRAPARE - Administrator, Civil Service (Medical): No    Lack of Transportation (Non-Medical): No  Physical  Activity: Insufficiently Active (12/06/2022)   Exercise Vital Sign    Days of Exercise per Week: 3 days    Minutes of Exercise per Session: 10 min  Stress: No Stress Concern Present (12/06/2022)   [employer] of Occupational Health - Occupational Stress Questionnaire    Feeling of Stress : Only a little  Social Connections: Socially Isolated (02/17/2024)   Social Connection and Isolation Panel    Frequency of Communication with Friends and Family: More than three times a week    Frequency of Social Gatherings with Friends and Family: Twice a week    Attends Religious Services: Never    Database administrator or Organizations: No    Attends Engineer, structural: Never    Marital Status: Divorced     Family History:  The patient's family history includes AAA (abdominal aortic aneurysm) in his maternal grandmother; Diabetes in his maternal grandfather and mother; Heart attack in his maternal grandmother and mother; Heart disease in his mother; Hyperlipidemia in his mother; Hypertension in his father and mother; Lung cancer in his father; Stroke in his father.  ROS:   12-point review of systems is negative unless otherwise noted in the HPI.   EKGs/Labs/Other Studies Reviewed:    Studies reviewed were summarized above. The additional studies were reviewed today:  2D echo 11/12/2023: 1. Left ventricular ejection fraction, by estimation, is 55 to 60%. The  left ventricle has normal function. The left ventricle has no regional  wall motion abnormalities. Left ventricular diastolic parameters were  normal. The average left ventricular  global longitudinal strain is -13.3 %.   2. Right ventricular systolic function is normal. The right ventricular  size is normal. Tricuspid regurgitation signal is inadequate for assessing  PA pressure.   3. The mitral valve is normal in structure. Mild mitral valve  regurgitation. No evidence of mitral stenosis.   4. The aortic valve is normal  in structure. There is mild calcification  of the aortic valve. Aortic valve regurgitation is not visualized. Aortic  valve sclerosis is present, with no evidence of aortic valve stenosis.   5. The inferior vena cava is normal in size with greater than 50%  respiratory variability, suggesting right atrial pressure of 3 mmHg.  __________   [facility] 10/30/2023: Conclusions: Moderate LAD and OM1 disease, not significantly different compared to prior catheterization in 05/2023.  RFR of sequential ostial and proximal LAD stenosis at that time was not hemodynamically significant.  Normal left and right heart filling pressures. Mild pulmonary hypertension. Normal Fick cardiac output/index.   Recommendations: Increase amlodipine  to 10 mg daily for BP control and antianginal therapy. Consider resumption of lisinopril  +/- spironolactone  at follow-up based on blood pressure and renal function. Continue aggressive secondary prevention of coronary artery disease. __________   [facility] 05/29/2023: Conclusions: Moderate, nonobstructive coronary artery disease with sequential 50-60% ostial and proximal LAD stenoses that are not hemodynamically significant (RFR = 0.93).  There is also a 40% ostial stenosis of a large first OM branch.  Nondominant RCA is without significant disease. Upper normal to mildly elevated left and right heart filling pressures. Mild pulmonary hypertension. Borderline low Fick cardiac output/index. Mild aortic stenosis.   Recommendations: Resume gentle diuresis tomorrow if renal function is stable. Medical therapy and risk factor modification to prevent progression of coronary artery disease. __________   2D echo 05/28/2023: 1. Left ventricular ejection fraction, by estimation, is 50 to 55%. The  left ventricle has low normal function. The left ventricle has no regional  wall motion abnormalities. There is moderate left ventricular hypertrophy.  Left ventricular diastolic   parameters are consistent with Grade III diastolic dysfunction  (restrictive). Elevated left atrial pressure. The average left ventricular  global longitudinal strain is -10.0 %. The global longitudinal strain is  abnormal.   2. Right ventricular systolic function is normal. The right ventricular  size is normal. Tricuspid regurgitation signal is inadequate for assessing  PA pressure.   3. Left atrial size was mildly dilated.   4. The mitral valve was not well visualized. Mild mitral valve  regurgitation.   5. The aortic valve has an indeterminant number of cusps. There is  moderate calcification of the aortic valve. There is moderate thickening  of the aortic valve. Aortic valve regurgitation is not visualized. Aortic  valve sclerosis/calcification is  present, without any evidence of aortic stenosis.   6. The inferior vena cava is normal in size with greater than 50%  respiratory variability, suggesting right atrial pressure of 3 mmHg.  __________   2D echo 08/23/2022: 1. Left ventricular ejection fraction, by estimation, is 60 to 65%. The  left ventricle has normal function. The left ventricle has no regional  wall motion abnormalities. There is mild left ventricular hypertrophy.  Left ventricular diastolic parameters  are consistent with Grade II diastolic dysfunction (pseudonormalization).   2. Right ventricular systolic function is normal. The right ventricular  size is normal.   3. The mitral valve is normal in structure. Mild mitral valve  regurgitation. No evidence of mitral stenosis.   4. The aortic valve is calcified. Aortic valve regurgitation is not  visualized. Aortic valve sclerosis/calcification is present, without any  evidence of aortic stenosis.   5. The inferior vena cava is normal in size with greater than 50%  respiratory variability, suggesting right atrial pressure of 3 mmHg.  __________   2D echo 11/04/2020: 1. Left ventricular ejection fraction, by  estimation, is 60 to 65%. The  left ventricle has normal function. The left ventricle has no regional  wall motion abnormalities. Left ventricular diastolic parameters are  consistent with Grade I diastolic  dysfunction (impaired relaxation).There is mild left ventricular  hypertrophy of the basal segment. LVOT gradient of 10 mm Hg at rest, 22 mm  Hg gradient with valsalva   2. Right ventricular systolic function is normal. The right ventricular  size is normal. There is normal pulmonary artery systolic pressure. The  estimated right ventricular systolic pressure is  31.6 mmHg.   3. Left atrial size was mildly dilated.  ____________   Lexiscan  MPI 10/10/2020: Low risk, probably normal pharmacologic myocardial perfusion stress tst. There is a small in size, moderate in severity, fixed defect at the apex most likely representing artifact (apical thinning and attenuation) and less likely scar. There is no evidence of significant ischemia. The left ventricular ejection fraction is normal (59%). There is no significant coronary artery calcification on the attenuation correction CT.   EKG:  EKG is ordered today.  The EKG ordered today demonstrates ***  Recent Labs: 02/19/2024: Magnesium  2.5 04/12/2024: ALT 19; B Natriuretic Peptide 197.9; Hemoglobin 13.8; Platelets 179 04/21/2024: BUN 21; Creatinine, Ser 1.50; Potassium 4.2; Sodium 137  Recent Lipid Panel    Component Value Date/Time   CHOL 193 04/21/2024 1459   CHOL 178 11/15/2023 0943   TRIG 314 (H) 04/21/2024 1459   HDL 47 04/21/2024 1459   HDL 41 11/15/2023 0943   CHOLHDL 4.1 04/21/2024 1459   VLDL 63 (H) 04/21/2024 1459   LDLCALC 83 04/21/2024 1459   LDLCALC 101 (H) 11/15/2023 0943   LDLCALC  08/09/2023 1156     Comment:     . LDL cholesterol not calculated. Triglyceride levels greater than 400 mg/dL invalidate calculated LDL results. . Reference range: <100 . Desirable range <100 mg/dL for primary prevention;   <70 mg/dL for  patients with CHD or diabetic patients  with > or = 2 CHD risk factors. SABRA LDL-C is now calculated using the Martin-Hopkins  calculation, which is a validated novel method providing  better accuracy than the Friedewald equation in the  estimation of LDL-C.  Gladis APPLETHWAITE et al. SANDREA. 7986;689(80): 2061-2068  (http://education.[employer].com/faq/FAQ164)    LDLDIRECT 106 (H) 11/15/2023 9056   LDLDIRECT 147 (H) 08/19/2023 2222    PHYSICAL EXAM:    VS:  There were no vitals taken for this visit.  BMI: There is no height or weight on file to calculate BMI.  Physical Exam  Wt Readings from Last 3 Encounters:  04/21/24 228 lb 2 oz (103.5 kg)  04/12/24 226 lb 3.1 oz (102.6 kg)  04/04/24 226 lb 3.1 oz (102.6 kg)     ASSESSMENT & PLAN:   CAD involving the native coronary arteries with stable angina:  HFpEF:  Pulmonary hypertension:  HTN: Blood pressure  HLD: LDL 106 in 10/2023 with target LDL less than 70.  CKD stage IIIa:  Obesity with asthma, OSA, and tracheobronchomalacia:  Tobacco use:   {Are you ordering a CV Procedure (e.g. stress test, cath, DCCV, TEE, etc)?   Press F2        :[id number]}     Disposition: F/u with Dr. Darliss or an APP in ***.   Medication Adjustments/Labs and Tests Ordered: Current medicines are reviewed at length with the patient today.  Concerns regarding medicines are outlined above. Medication changes, Labs and Tests ordered today are summarized above and listed in the Patient Instructions accessible in Encounters.   Signed, Bernardino Bring, PA-C 05/11/2024 10:31 AM      [facility] [address] [place], [place] [address] [phone]

## 2024-05-12 ENCOUNTER — Ambulatory Visit: Admitting: Physician Assistant

## 2024-05-15 ENCOUNTER — Other Ambulatory Visit: Payer: Self-pay

## 2024-05-15 ENCOUNTER — Encounter: Payer: Self-pay | Admitting: Internal Medicine

## 2024-05-15 ENCOUNTER — Ambulatory Visit: Admitting: Internal Medicine

## 2024-05-15 VITALS — BP 138/82 | HR 85 | Temp 98.4°F | Resp 16 | Ht 64.0 in | Wt 226.1 lb

## 2024-05-15 DIAGNOSIS — T7840XA Allergy, unspecified, initial encounter: Secondary | ICD-10-CM

## 2024-05-15 DIAGNOSIS — R21 Rash and other nonspecific skin eruption: Secondary | ICD-10-CM | POA: Diagnosis not present

## 2024-05-15 MED ORDER — CETIRIZINE HCL 10 MG PO TABS
10.0000 mg | ORAL_TABLET | Freq: Every day | ORAL | 11 refills | Status: DC
  Filled 2024-05-15 – 2024-07-14 (×2): qty 30, 30d supply, fill #0
  Filled 2024-08-25: qty 30, 30d supply, fill #1
  Filled 2024-09-25: qty 30, 30d supply, fill #2

## 2024-05-15 NOTE — Progress Notes (Signed)
 Acute Office Visit  Subjective:     Patient ID: Cory Rivas, male    DOB: 1966-06-15, 58 y.o.   MRN: [id number]  Chief Complaint  Patient presents with   Rash    Right arm, itching, burning and pain    Rash Pertinent negatives include no fever.   Patient is in today for rash. He is a patient of [facility] and this is my first time meeting him.   Discussed the use of AI scribe software for clinical note transcription with the patient, who gave verbal consent to proceed.  History of Present Illness Cory Rivas is a 58 year old male who presents with a recurrent rash on his arm.  The rash occurs annually from July to September, starting on his arm and spreading. It consists of pink, rough patches that bleed easily when scratched and is particularly bothersome at night. He uses a prescribed cream with minimal relief and occasionally takes Benadryl , which helps calm the symptoms. He does not take regular allergy medications.  He spends significant time outdoors, engaging in activities like mowing the yard and maintaining a long driveway, which he suspects might be related to his symptoms. 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  The rash does not have fluid drainage or blistering. It becomes pink and reddish with small black spots the size of a pencil eraser.    Review of Systems  Constitutional:  Negative for chills and fever.  Skin:  Positive for itching and rash.        Objective:    BP 138/82 (Cuff Size: Large)   Pulse 85   Temp 98.4 F (36.9 C) (Oral)   Resp 16   Ht 5' 4 (1.626 m)   Wt 226 lb 1.6 oz (102.6 kg)   SpO2 96%   BMI 38.81 kg/m    Physical Exam Constitutional:      Appearance: Normal appearance.  HENT:     Head: Normocephalic and atraumatic.  Eyes:     Conjunctiva/sclera: Conjunctivae normal.  Skin:    General: Skin is warm and dry.     Findings: Rash present.     Comments: Macular  erythematous rash down right upper arm  Neurological:     General: No focal deficit present.     Mental Status: He is alert. Mental status is at baseline.  Psychiatric:        Mood and Affect: Mood normal.        Behavior: Behavior normal.     No results found for any visits on 05/15/24.      Assessment & Plan:   Assessment & Plan Seasonal allergic dermatitis Recurrent rash on the arm from July to September, likely due to environmental allergens. Symptoms improve with Benadryl , indicating an allergic etiology. Differential diagnosis included shingles, but presentation is inconsistent. - Prescribed Zyrtec  (cetirizine ) once daily. - Sent prescription to [facility]. - Consider allergist referral for skin testing if symptoms persist.  COPD/Asthma Asthma with recent chest tightness, possibly exacerbated by environmental allergens. Discussed the relationship between asthma and allergies, noting increased inhaler use may be necessary during outdoor exposure. - Advise use of rescue inhaler as needed, especially in summer. - Ensure use of Spiriva  as prescribed. - Instructed to inform lung doctor about recent symptoms at August 11th appointment.  - cetirizine  (ZYRTEC ) 10 MG tablet; Take 1 tablet (10 mg total) by mouth daily.  Dispense: 30 tablet; Refill:  11   Return in about 3 months (around 08/15/2024) for with PCP.  Sharyle Fischer, DO

## 2024-05-17 ENCOUNTER — Observation Stay
Admission: EM | Admit: 2024-05-17 | Discharge: 2024-05-17 | Disposition: A | Discharge status: Home / Self Care | Attending: Hospitalist | Admitting: Hospitalist

## 2024-05-17 ENCOUNTER — Other Ambulatory Visit: Payer: Self-pay

## 2024-05-17 ENCOUNTER — Emergency Department

## 2024-05-17 DIAGNOSIS — J449 Chronic obstructive pulmonary disease, unspecified: Secondary | ICD-10-CM | POA: Diagnosis not present

## 2024-05-17 DIAGNOSIS — F17210 Nicotine dependence, cigarettes, uncomplicated: Secondary | ICD-10-CM | POA: Diagnosis not present

## 2024-05-17 DIAGNOSIS — Z7982 Long term (current) use of aspirin: Secondary | ICD-10-CM | POA: Diagnosis not present

## 2024-05-17 DIAGNOSIS — I10 Essential (primary) hypertension: Secondary | ICD-10-CM | POA: Diagnosis not present

## 2024-05-17 DIAGNOSIS — E1165 Type 2 diabetes mellitus with hyperglycemia: Secondary | ICD-10-CM

## 2024-05-17 DIAGNOSIS — N1831 Chronic kidney disease, stage 3a: Secondary | ICD-10-CM | POA: Insufficient documentation

## 2024-05-17 DIAGNOSIS — I2510 Atherosclerotic heart disease of native coronary artery without angina pectoris: Secondary | ICD-10-CM | POA: Diagnosis present

## 2024-05-17 DIAGNOSIS — M109 Gout, unspecified: Secondary | ICD-10-CM

## 2024-05-17 DIAGNOSIS — I2089 Other forms of angina pectoris: Secondary | ICD-10-CM

## 2024-05-17 DIAGNOSIS — I129 Hypertensive chronic kidney disease with stage 1 through stage 4 chronic kidney disease, or unspecified chronic kidney disease: Secondary | ICD-10-CM | POA: Diagnosis not present

## 2024-05-17 DIAGNOSIS — R778 Other specified abnormalities of plasma proteins: Secondary | ICD-10-CM | POA: Diagnosis not present

## 2024-05-17 DIAGNOSIS — G959 Disease of spinal cord, unspecified: Secondary | ICD-10-CM

## 2024-05-17 DIAGNOSIS — E7132 Disorders of ketone metabolism: Secondary | ICD-10-CM | POA: Diagnosis not present

## 2024-05-17 DIAGNOSIS — R7989 Other specified abnormal findings of blood chemistry: Secondary | ICD-10-CM

## 2024-05-17 DIAGNOSIS — J4489 Other specified chronic obstructive pulmonary disease: Secondary | ICD-10-CM | POA: Diagnosis present

## 2024-05-17 DIAGNOSIS — I5032 Chronic diastolic (congestive) heart failure: Secondary | ICD-10-CM | POA: Diagnosis present

## 2024-05-17 DIAGNOSIS — E785 Hyperlipidemia, unspecified: Secondary | ICD-10-CM

## 2024-05-17 DIAGNOSIS — R0789 Other chest pain: Secondary | ICD-10-CM | POA: Diagnosis not present

## 2024-05-17 DIAGNOSIS — R079 Chest pain, unspecified: Secondary | ICD-10-CM | POA: Diagnosis not present

## 2024-05-17 DIAGNOSIS — M1A09X0 Idiopathic chronic gout, multiple sites, without tophus (tophi): Secondary | ICD-10-CM

## 2024-05-17 DIAGNOSIS — F17220 Nicotine dependence, chewing tobacco, uncomplicated: Secondary | ICD-10-CM | POA: Diagnosis not present

## 2024-05-17 DIAGNOSIS — I200 Unstable angina: Principal | ICD-10-CM

## 2024-05-17 DIAGNOSIS — Z794 Long term (current) use of insulin: Secondary | ICD-10-CM | POA: Diagnosis not present

## 2024-05-17 HISTORY — DX: Morbid (severe) obesity due to excess calories: E66.01

## 2024-05-17 HISTORY — DX: Disease of spinal cord, unspecified: G95.9

## 2024-05-17 HISTORY — DX: Obstructive sleep apnea (adult) (pediatric): G47.33

## 2024-05-17 HISTORY — DX: Hyperlipidemia, unspecified: E78.5

## 2024-05-17 HISTORY — DX: Other specified diseases of upper respiratory tract: J39.8

## 2024-05-17 HISTORY — DX: Chronic kidney disease, stage 3 unspecified: N18.30

## 2024-05-17 HISTORY — DX: Chronic diastolic (congestive) heart failure: I50.32

## 2024-05-17 HISTORY — DX: Atherosclerotic heart disease of native coronary artery without angina pectoris: I25.10

## 2024-05-17 LAB — CBC
HCT: 44.7 % (ref 39.0–52.0)
Hemoglobin: 15 g/dL (ref 13.0–17.0)
MCH: 28.7 pg (ref 26.0–34.0)
MCHC: 33.6 g/dL (ref 30.0–36.0)
MCV: 85.6 fL (ref 80.0–100.0)
Platelets: 174 K/uL (ref 150–400)
RBC: 5.22 MIL/uL (ref 4.22–5.81)
RDW: 13.6 % (ref 11.5–15.5)
WBC: 11 K/uL — ABNORMAL HIGH (ref 4.0–10.5)
nRBC: 0 % (ref 0.0–0.2)

## 2024-05-17 LAB — BASIC METABOLIC PANEL WITH GFR
Anion gap: 12 (ref 5–15)
BUN: 15 mg/dL (ref 6–20)
CO2: 24 mmol/L (ref 22–32)
Calcium: 8.7 mg/dL — ABNORMAL LOW (ref 8.9–10.3)
Chloride: 101 mmol/L (ref 98–111)
Creatinine, Ser: 1.47 mg/dL — ABNORMAL HIGH (ref 0.61–1.24)
GFR, Estimated: 55 mL/min — ABNORMAL LOW (ref >60)
Glucose, Bld: 360 mg/dL — ABNORMAL HIGH (ref 70–99)
Potassium: 3.5 mmol/L (ref 3.5–5.1)
Sodium: 137 mmol/L (ref 135–145)

## 2024-05-17 LAB — TROPONIN I (HIGH SENSITIVITY)
Troponin I (High Sensitivity): 44 ng/L — ABNORMAL HIGH (ref <18)
Troponin I (High Sensitivity): 59 ng/L — ABNORMAL HIGH (ref <18)
Troponin I (High Sensitivity): 65 ng/L — ABNORMAL HIGH (ref <18)
Troponin I (High Sensitivity): 64 ng/L — ABNORMAL HIGH (ref <18)

## 2024-05-17 LAB — CBG MONITORING, ED
Glucose-Capillary: 213 mg/dL — ABNORMAL HIGH (ref 70–99)
Glucose-Capillary: 311 mg/dL — ABNORMAL HIGH (ref 70–99)

## 2024-05-17 LAB — C-REACTIVE PROTEIN: CRP: 1.8 mg/dL — ABNORMAL HIGH (ref <1.0)

## 2024-05-17 LAB — GLUCOSE, CAPILLARY: Glucose-Capillary: 218 mg/dL — ABNORMAL HIGH (ref 70–99)

## 2024-05-17 LAB — BRAIN NATRIURETIC PEPTIDE: B Natriuretic Peptide: 32.4 pg/mL (ref 0.0–100.0)

## 2024-05-17 LAB — SEDIMENTATION RATE: Sed Rate: 34 mm/h — ABNORMAL HIGH (ref 0–20)

## 2024-05-17 MED ORDER — IRBESARTAN 75 MG PO TABS
75.0000 mg | ORAL_TABLET | Freq: Every day | ORAL | Status: DC
  Administered 2024-05-17: 75 mg via ORAL
  Filled 2024-05-17: qty 1

## 2024-05-17 MED ORDER — IRBESARTAN 75 MG PO TABS
75.0000 mg | ORAL_TABLET | Freq: Once | ORAL | Status: AC
  Administered 2024-05-17: 75 mg via ORAL
  Filled 2024-05-17 (×2): qty 1

## 2024-05-17 MED ORDER — RANOLAZINE ER 500 MG PO TB12
500.0000 mg | ORAL_TABLET | Freq: Two times a day (BID) | ORAL | Status: DC
  Administered 2024-05-17: 500 mg via ORAL
  Filled 2024-05-17: qty 1

## 2024-05-17 MED ORDER — ACETAMINOPHEN 325 MG PO TABS: 650.0000 mg | ORAL_TABLET | ORAL | Status: DC | PRN

## 2024-05-17 MED ORDER — SPIRONOLACTONE 25 MG PO TABS
25.0000 mg | ORAL_TABLET | Freq: Every day | ORAL | Status: DC
  Administered 2024-05-17: 25 mg via ORAL
  Filled 2024-05-17: qty 1

## 2024-05-17 MED ORDER — EMPAGLIFLOZIN 10 MG PO TABS
10.0000 mg | ORAL_TABLET | Freq: Every day | ORAL | Status: DC
  Administered 2024-05-17: 10 mg via ORAL
  Filled 2024-05-17: qty 1

## 2024-05-17 MED ORDER — DULOXETINE HCL 30 MG PO CPEP: 30.0000 mg | ORAL_CAPSULE | Freq: Every evening | ORAL | Status: DC

## 2024-05-17 MED ORDER — TORSEMIDE 20 MG PO TABS
40.0000 mg | ORAL_TABLET | Freq: Every day | ORAL | Status: DC
  Administered 2024-05-17: 40 mg via ORAL
  Filled 2024-05-17: qty 2

## 2024-05-17 MED ORDER — ENOXAPARIN SODIUM 60 MG/0.6ML IJ SOSY
0.5000 mg/kg | PREFILLED_SYRINGE | INTRAMUSCULAR | Status: DC
  Administered 2024-05-17: 52.5 mg via SUBCUTANEOUS
  Filled 2024-05-17: qty 0.6

## 2024-05-17 MED ORDER — INSULIN ASPART 100 UNIT/ML IJ SOLN: 0.0000 [IU] | Freq: Every day | INTRAMUSCULAR | Status: DC

## 2024-05-17 MED ORDER — BISOPROLOL FUMARATE 5 MG PO TABS
10.0000 mg | ORAL_TABLET | Freq: Every day | ORAL | Status: DC
  Administered 2024-05-17: 10 mg via ORAL
  Filled 2024-05-17: qty 2

## 2024-05-17 MED ORDER — IPRATROPIUM-ALBUTEROL 0.5-2.5 (3) MG/3ML IN SOLN: 3.0000 mL | Freq: Four times a day (QID) | RESPIRATORY_TRACT | Status: DC | PRN

## 2024-05-17 MED ORDER — NICOTINE 7 MG/24HR TD PT24
7.0000 mg | MEDICATED_PATCH | Freq: Every day | TRANSDERMAL | Status: DC
  Administered 2024-05-17: 7 mg via TRANSDERMAL
  Filled 2024-05-17: qty 1

## 2024-05-17 MED ORDER — EZETIMIBE 10 MG PO TABS
10.0000 mg | ORAL_TABLET | Freq: Every day | ORAL | Status: DC
  Administered 2024-05-17: 10 mg via ORAL
  Filled 2024-05-17: qty 1

## 2024-05-17 MED ORDER — INSULIN ASPART 100 UNIT/ML IJ SOLN
0.0000 [IU] | Freq: Three times a day (TID) | INTRAMUSCULAR | Status: DC
  Administered 2024-05-17: 7 [IU] via SUBCUTANEOUS
  Administered 2024-05-17: 15 [IU] via SUBCUTANEOUS
  Filled 2024-05-17: qty 7
  Filled 2024-05-17: qty 15

## 2024-05-17 MED ORDER — AMLODIPINE BESYLATE 10 MG PO TABS
10.0000 mg | ORAL_TABLET | Freq: Every day | ORAL | Status: DC
  Administered 2024-05-17: 10 mg via ORAL
  Filled 2024-05-17: qty 2

## 2024-05-17 MED ORDER — DULOXETINE HCL 30 MG PO CPEP
60.0000 mg | ORAL_CAPSULE | ORAL | Status: DC
  Administered 2024-05-17: 60 mg via ORAL
  Filled 2024-05-17: qty 1

## 2024-05-17 MED ORDER — ROSUVASTATIN CALCIUM 10 MG PO TABS
40.0000 mg | ORAL_TABLET | Freq: Every evening | ORAL | Status: DC
Start: 2024-05-17 — End: 2024-05-17
  Filled 2024-05-17: qty 2

## 2024-05-17 MED ORDER — RANOLAZINE ER 500 MG PO TB12
500.0000 mg | ORAL_TABLET | Freq: Two times a day (BID) | ORAL | 2 refills | Status: DC
  Filled 2024-05-17 – 2024-06-01 (×4): qty 60, 30d supply, fill #0
  Filled 2024-07-09 – 2024-07-14 (×2): qty 60, 30d supply, fill #1
  Filled 2024-08-25: qty 60, 30d supply, fill #2

## 2024-05-17 MED ORDER — ALLOPURINOL 100 MG PO TABS: 300.0000 mg | ORAL_TABLET | Freq: Every day | ORAL | Status: DC | PRN

## 2024-05-17 MED ORDER — ASPIRIN 81 MG PO TBEC
81.0000 mg | DELAYED_RELEASE_TABLET | Freq: Every day | ORAL | Status: DC
  Administered 2024-05-17: 81 mg via ORAL
  Filled 2024-05-17: qty 1

## 2024-05-17 MED ORDER — ONDANSETRON HCL 4 MG/2ML IJ SOLN: 4.0000 mg | Freq: Four times a day (QID) | INTRAMUSCULAR | Status: DC | PRN

## 2024-05-17 MED ORDER — ALLOPURINOL 300 MG PO TABS: 300.0000 mg | ORAL_TABLET | Freq: Every day | ORAL | Status: AC | PRN

## 2024-05-17 MED ORDER — IRBESARTAN 150 MG PO TABS: 150.0000 mg | ORAL_TABLET | Freq: Every day | ORAL | Status: DC

## 2024-05-17 MED ORDER — ALLOPURINOL 100 MG PO TABS: 100.0000 mg | ORAL_TABLET | Freq: Every day | ORAL | Status: DC | PRN

## 2024-05-17 MED ORDER — FLUTICASONE FUROATE-VILANTEROL 200-25 MCG/ACT IN AEPB
1.0000 | INHALATION_SPRAY | Freq: Every day | RESPIRATORY_TRACT | Status: DC
  Administered 2024-05-17: 1 via RESPIRATORY_TRACT
  Filled 2024-05-17: qty 28

## 2024-05-17 MED ORDER — PREGABALIN 50 MG PO CAPS: 50.0000 mg | ORAL_CAPSULE | Freq: Two times a day (BID) | ORAL | Status: AC | PRN

## 2024-05-17 NOTE — Assessment & Plan Note (Signed)
 Continue amlodipine , bisoprolol 

## 2024-05-17 NOTE — Assessment & Plan Note (Signed)
 Moderate CAD on left heart cath January 2025 Nitroglycerin  intolerance Continue medical therapy with aspirin , statin, bisoprolol , Diovan  Continue amlodipine  for anginal symptoms Morphine  for pain Cardiology consult for any additional recommendations

## 2024-05-17 NOTE — Progress Notes (Signed)
 Anticoagulation monitoring(Lovenox ):  58 yo male ordered Lovenox  40 mg Q24h    Filed Weights   05/17/24 0042  Weight: 103 kg (227 lb)   BMI 39   Lab Results  Component Value Date   CREATININE 1.47 (H) 05/17/2024   CREATININE 1.50 (H) 04/21/2024   CREATININE 1.30 (H) 04/12/2024   Estimated Creatinine Clearance: 60.1 mL/min (A) (by C-G formula based on SCr of 1.47 mg/dL (H)). Hemoglobin & Hematocrit     Component Value Date/Time   HGB 15.0 05/17/2024 0115   HGB 17.2 10/28/2023 1139   HCT 44.7 05/17/2024 0115   HCT 51.6 (H) 10/28/2023 1139     Per Protocol for Patient with estCrcl > 30 ml/min and BMI > 30, will transition to Lovenox  52.5 mg Q24h.    '

## 2024-05-17 NOTE — Assessment & Plan Note (Signed)
 Pulmonary hypertension on right heart cath January 2025 Tobacco use disorder Continue home inhalers Albuterol  as needed Nicotine  patch

## 2024-05-17 NOTE — ED Provider Notes (Addendum)
 [facility] Provider Note    Event Date/Time   First MD Initiated Contact with Patient 05/17/24 0045     (approximate)   History   Chest Pain   HPI  Cory Rivas is a 58 y.o. male with a history of diabetes hyperlipidemia, CKD, CHF, former tobacco use who presents with complaints of chest pain.  Patient reports around 4 PM yesterday afternoon had pressure-like discomfort which radiated to his neck and his left arm.  This worsened when he tried to lay down to go to bed, he did take 4 aspirin .  He reports feeling improved now.     Physical Exam   Triage Vital Signs: ED Triage Vitals  Encounter Vitals Group     BP 05/17/24 0045 (!) 161/103     Girls Systolic BP Percentile --      Girls Diastolic BP Percentile --      Boys Systolic BP Percentile --      Boys Diastolic BP Percentile --      Pulse Rate 05/17/24 0045 86     Resp 05/17/24 0045 18     Temp 05/17/24 0045 98.5 F (36.9 C)     Temp Source 05/17/24 0045 Oral     SpO2 05/17/24 0045 96 %     Weight 05/17/24 0042 103 kg (227 lb)     Height 05/17/24 0042 1.626 m (5' 4)     Head Circumference --      Peak Flow --      Pain Score 05/17/24 0041 8     Pain Loc --      Pain Education --      Exclude from Growth Chart --     Most recent vital signs: Vitals:   05/17/24 0045 05/17/24 0304  BP: (!) 161/103 (!) 141/87  Pulse: 86   Resp: 18   Temp: 98.5 F (36.9 C)   SpO2: 96%      General: Awake, no distress.  CV:  Good peripheral perfusion.  Regular rate and rhythm Resp:  Normal effort.  Clear to auscultation bilaterally Abd:  No distention.  Other:  No lower extremity edema   ED Results / Procedures / Treatments   Labs (all labs ordered are listed, but only abnormal results are displayed) Labs Reviewed  BASIC METABOLIC PANEL WITH GFR - Abnormal; Notable for the following components:      Result Value   Glucose, Bld 360 (*)    Creatinine, Ser 1.47 (*)    Calcium  8.7 (*)     GFR, Estimated 55 (*)    All other components within normal limits  CBC - Abnormal; Notable for the following components:   WBC 11.0 (*)    All other components within normal limits  TROPONIN I (HIGH SENSITIVITY) - Abnormal; Notable for the following components:   Troponin I (High Sensitivity) 44 (*)    All other components within normal limits  TROPONIN I (HIGH SENSITIVITY) - Abnormal; Notable for the following components:   Troponin I (High Sensitivity) 59 (*)    All other components within normal limits  BRAIN NATRIURETIC PEPTIDE     EKG  ED ECG REPORT I, Lamar Price, the attending physician, personally viewed and interpreted this ECG.  Date: 05/17/2024  Rhythm: normal sinus rhythm QRS Axis: normal Intervals: normal ST/T Wave abnormalities: Nonspecific changes, not significantly different from prior EKG Narrative Interpretation: No significant change from prior EKG    RADIOLOGY Chest x-ray viewed interpret by me,  no acute abnormality, confirmed by radiology    PROCEDURES:  Critical Care performed:   Procedures   MEDICATIONS ORDERED IN ED: Medications - No data to display   IMPRESSION / MDM / ASSESSMENT AND PLAN / ED COURSE  I reviewed the triage vital signs and the nursing notes. Patient's presentation is most consistent with acute presentation with potential threat to life or bodily function.  Patient with multiple comorbidities presents with chest pain as detailed above, differential includes ACS, angina, CHF, GERD  He is feeling improved here.  EKG is not significantly changed from prior.  Pending labs.  High sensitive troponin is 44, his baseline appears to be around 30, overall not an overly significant change  Decided to repeat troponin and it is increased further, given his multitude of risk factors we will discuss with the hospitalist for admission      FINAL CLINICAL IMPRESSION(S) / ED DIAGNOSES   Final diagnoses:  Nonspecific chest pain   Elevated troponin     Rx / DC Orders   ED Discharge Orders     None        Note:  This document was prepared using Dragon voice recognition software and may include unintentional dictation errors.   Arlander Charleston, MD 05/17/24 [id number]    Arlander Charleston, MD 05/17/24 [phone]

## 2024-05-17 NOTE — Consult Note (Addendum)
 Cardiology Consult    Patient ID: Cory Rivas MRN: [id number], DOB/AGE: [birth year]   Admit date: 05/17/2024 Date of Consult: 05/17/2024  Primary Physician: Cory Mole, PA-C Primary Cardiologist: Cory Cave, MD   Requesting Provider: IVAR Haber, MD  Patient Profile    Cory Rivas is a 58 y.o. male with a history of moderate, nonobstructive CAD, chest pain, chronic HFpEF, stage IIIa chronic kidney disease, insulin -dependent diabetes mellitus, hypertension, hyperlipidemia, morbid obesity, tobacco abuse, COPD, gout, sleep apnea, tracheobronchomalacia, and cervical myelopathy, who is being seen today for the evaluation of chest pain and troponin elevation at the request of Dr. Haber.  Past Medical History   Subjective  Past Medical History:  Diagnosis Date   Bulging of cervical intervertebral disc    CAD (coronary artery disease)    a. 09/2020 MV: low risk; b. 05/2023 Cath: Moderate LAD dzs w/ seq 50-60% stenoses and nl RFR 0.93. Mod OM dzs->Med rx; b. 10/2023 Cath: LM min irregs, LAD 55ost, 55p/m, LCX large nl, OM1 50, RCA mod, nl.   Cervical myelopathy (HCC)    Chronic heart failure with preserved ejection fraction (HFpEF) (HCC)    a. 05/2023 Echo: EF 50-55%, no rwma, GrIII DD, nl RV fxn, mild MR, Ao sclerosis; b. 10/2023 Echo: EF 55-60%, no rwma, nl RV fxn, mild MR, Ao sclerosis.   CKD (chronic kidney disease), stage III (HCC)    COPD (chronic obstructive pulmonary disease) (HCC)    Diabetes mellitus without complication (HCC)    Gout    Hyperlipidemia LDL goal <70    Hypertension    Morbid obesity (HCC)    OSA (obstructive sleep apnea)    Stress due to family tension 04/15/2019   Tracheobronchomalacia     Past Surgical History:  Procedure Laterality Date   BACK SURGERY     CARDIAC CATHETERIZATION     CHOLECYSTECTOMY     COLONOSCOPY WITH PROPOFOL  N/A 03/10/2020   Procedure: COLONOSCOPY WITH PROPOFOL ;  Surgeon: Cory Carmine, MD;  Location: [facility] ENDOSCOPY;  Service:  Endoscopy;  Laterality: N/A;   COLONOSCOPY WITH PROPOFOL  N/A 09/25/2023   Procedure: COLONOSCOPY WITH PROPOFOL ;  Surgeon: Cory Carmine, MD;  Location: [facility] ENDOSCOPY;  Service: Endoscopy;  Laterality: N/A;   CORONARY PRESSURE/FFR STUDY N/A 05/29/2023   Procedure: CORONARY PRESSURE/FFR STUDY;  Surgeon: Cory Bruckner, MD;  Location: [facility] INVASIVE CV LAB;  Service: Cardiovascular;  Laterality: N/A;   POLYPECTOMY  09/25/2023   Procedure: POLYPECTOMY;  Surgeon: Cory Carmine, MD;  Location: [facility] ENDOSCOPY;  Service: Endoscopy;;   RIGHT/LEFT HEART CATH AND CORONARY ANGIOGRAPHY N/A 05/29/2023   Procedure: RIGHT/LEFT HEART CATH AND CORONARY ANGIOGRAPHY;  Surgeon: Cory Bruckner, MD;  Location: [facility] INVASIVE CV LAB;  Service: Cardiovascular;  Laterality: N/A;   RIGHT/LEFT HEART CATH AND CORONARY ANGIOGRAPHY Bilateral 10/30/2023   Procedure: RIGHT/LEFT HEART CATH AND CORONARY ANGIOGRAPHY;  Surgeon: Cory Bruckner, MD;  Location: [facility] INVASIVE CV LAB;  Service: Cardiovascular;  Laterality: Bilateral;     Allergies  Allergies  Allergen Reactions   Oxycodone  Nausea And Vomiting   Entresto  [Sacubitril -Valsartan ] Nausea Only and Other (See Comments)    Lightheaded and dizzy    Onion Nausea And Vomiting   Nitroglycerin  Itching and Rash    IV product, patient says SL tablet is fine       History of Present Illness   58 year old male with a history of moderate, nonobstructive CAD, chest pain, chronic HFpEF, stage IIIa chronic kidney disease, insulin -dependent diabetes mellitus, hypertension, hyperlipidemia, morbid obesity, tobacco abuse, COPD,  gout, sleep apnea, tracheobronchomalacia, and cervical myelopathy.  Cardiac history dates back to 2021, when he underwent stress testing in December 2021 as part of preoperative evaluation, which was low risk but did show coronary calcification on CT imaging.  He had an echocardiogram in January 2022 that showed an EF of 60 to 65% with grade 1 diastolic  dysfunction.    In August 2024, he was admitted to [facility] with chest pain, dyspnea, and hypertensive urgency.  Troponin was mildly elevated to a peak of 26.  Echo showed an EF of 50 to 55% without regional wall motion abnormalities, moderate LVH, and grade 3 diastolic dysfunction.  Diagnostic catheterization revealed moderate, nonobstructive CAD including sequential 50-60% ostial proximal LAD stenoses that were not hemodynamically significant by RFR (0.93).  Medical therapy and ongoing diuresis were recommended.  He was readmitted in November 2024 for hypertensive urgency and HFpEF requiring diuresis.  He was subsequently treated for pneumonia in November 2024.  In early January 2025, he was admitted to [facility] with respiratory failure, COPD exacerbation, HFpEF, and acute on chronic stage III kidney disease.  He required diuresis, steroids, nebulizers, and antibiotics.  Following discharge, he reported ongoing chest discomfort at office follow-up and underwent diagnostic catheterization on October 30, 2023 showing overall stable anatomy with recommendation for escalation of medical therapy (amlodipine  increased to 10 mg daily).  He was seen in the ER in March 2025 and was admitted in April 2025 with atypical chest pain, mild troponin elevation, COPD, and HFpEF.  At most recent cardiology office visit in April 2025, he was euvolemic and without angina.  Unfortunately, Mr. [facility] required readmission in June 2025 in the setting of acute on chronic HFpEF and chest pressure.  Troponins were again mildly elevated with a flat trend.  He was diuresed and subsequently discharged.  He was felt to be euvolemic at July 8 heart failure clinic follow-up.    Over the past year or so, he has almost daily retrosternal chest discomfort that can occur at rest or with activity.  Symptoms can last anywhere between 30 minutes to an hour.  He has never taken sublingual nitroglycerin  for this but also has a  listed allergy to IV nitro and does not have nitroglycerin  at home.  He was in his usual state of health until the afternoon of August 2, when he had just parked his tractor and walked back into his home and felt tachypalpitations associated with mild chest pressure.  This lasted a few minutes and resolved spontaneously.  Within 15 to 30 minutes, he started noticing more significant heaviness in his upper chest and lower neck as well as tenderness throughout his chest and upper abdomen.  Though he is accustomed to experiencing dyspnea when he has chest pressure, he was not having dyspnea at this time nor did he noticed any recurrence of tachypalpitations.  He took 4 baby aspirin  and symptoms seem to improve but not completely abate and then sometime after 8 PM, symptoms worsened again and by 11 PM, symptoms were associated with left arm numbness.  At that point that he decided to present to the emergency department.  On arrival, he was afebrile and hypertensive at 161/103.  ECG showed regular sinus rhythm at 98 with inferolateral ST depression and delayed R wave progression, similar to prior ECGs.  Labs notable for mild leukocytosis at 11, H&H normal at 15/44.7, creatinine stable at 1.47, and hyperglycemia with a glucose of 360.  Chest x-ray showed no active cardiopulmonary  disease.  Without treatment, pressure eased down throughout the night and he currently reports 3-4/10 retrosternal chest pressure and tenderness that is also worse with deep breathing.  Overall, he feels much better.  Inpatient Medications   Subjective    amLODipine   10 mg Oral Daily   aspirin  EC  81 mg Oral Daily   bisoprolol   10 mg Oral Daily   DULoxetine   60 mg Oral BH-q7a   And   DULoxetine   30 mg Oral QPM   empagliflozin   10 mg Oral Daily   enoxaparin  (LOVENOX ) injection  0.5 mg/kg Subcutaneous Q24H   ezetimibe   10 mg Oral Daily   fluticasone  furoate-vilanterol  1 puff Inhalation Daily   insulin  aspart  0-20 Units  Subcutaneous TID WC   insulin  aspart  0-5 Units Subcutaneous QHS   [START ON 05/18/2024] irbesartan   150 mg Oral Daily   irbesartan   75 mg Oral Once   nicotine   7 mg Transdermal Daily   rosuvastatin   40 mg Oral QPM   spironolactone   25 mg Oral Daily   torsemide   40 mg Oral Daily    Family History    Family History  Problem Relation Age of Onset   Heart disease Mother    Diabetes Mother    Hyperlipidemia Mother    Hypertension Mother    Heart attack Mother    Lung cancer Father    Hypertension Father    Stroke Father    AAA (abdominal aortic aneurysm) Maternal Grandmother    Heart attack Maternal Grandmother    Diabetes Maternal Grandfather    He indicated that his mother is deceased. He indicated that his father is deceased. He indicated that his sister is alive. He indicated that his maternal grandmother is deceased. He indicated that his maternal grandfather is deceased. He indicated that his paternal grandmother is deceased. He indicated that his paternal grandfather is deceased.   Social History    Social History   Socioeconomic History   Marital status: Divorced    Spouse name: Not on file   Number of children: 0   Years of education: 9   Highest education level: 9th grade  Occupational History   Occupation: disability  Tobacco Use   Smoking status: Every Day    Current packs/day: 0.00    Average packs/day: 1 pack/day for 37.0 years (37.0 ttl pk-yrs)    Types: Cigarettes    Start date: 10/19/1989    Last attempt to quit: 08/20/2022    Years since quitting: 1.7   Smokeless tobacco: Never   Tobacco comments:    30+ years hx smoking 2 ppd, stopped smoking briefly in April 2022 for a surgery and then restarted smoking.  As of August 2025, he smoking about 2 to 4 cigarettes a day.  Vaping Use   Vaping status: Never Used  Substance and Sexual Activity   Alcohol use: No   Drug use: No   Sexual activity: Not Currently    Partners: Female  Other Topics Concern   Not  on file  Social History Narrative   Lives with sister [name], brother in law [name]), and his mother - Jaylan Hinojosa   Does not routinely exercise.   Social Drivers of Health   Financial Resource Strain: High Risk (01/21/2024)   Overall Financial Resource Strain (CARDIA)    Difficulty of Paying Living Expenses: Hard  Food Insecurity: No Food Insecurity (04/04/2024)   Hunger Vital Sign    Worried About Running Out of Food in  the Last Year: Never true    Ran Out of Food in the Last Year: Never true  Transportation Needs: No Transportation Needs (04/04/2024)   PRAPARE - Administrator, Civil Service (Medical): No    Lack of Transportation (Non-Medical): No  Physical Activity: Insufficiently Active (12/06/2022)   Exercise Vital Sign    Days of Exercise per Week: 3 days    Minutes of Exercise per Session: 10 min  Stress: No Stress Concern Present (12/06/2022)   [employer] of Occupational Health - Occupational Stress Questionnaire    Feeling of Stress : Only a little  Social Connections: Socially Isolated (02/17/2024)   Social Connection and Isolation Panel    Frequency of Communication with Friends and Family: More than three times a week    Frequency of Social Gatherings with Friends and Family: Twice a week    Attends Religious Services: Never    Database administrator or Organizations: No    Attends Banker Meetings: Never    Marital Status: Divorced  Catering manager Violence: Not At Risk (04/04/2024)   Humiliation, Afraid, Rape, and Kick questionnaire    Fear of Current or Ex-Partner: No    Emotionally Abused: No    Physically Abused: No    Sexually Abused: No     Review of Systems    General:  No chills, fever, night sweats or weight changes.  Cardiovascular:  +++ chest pain, +++ dyspnea on exertion, no edema, orthopnea, +++ palpitations, no paroxysmal nocturnal dyspnea. Dermatological: No rash, lesions/masses Respiratory: No cough, +++ chronic dyspnea  on exertion Urologic: No hematuria, dysuria Abdominal:   No nausea, vomiting, diarrhea, bright red blood per rectum, melena, or hematemesis Neurologic:  No visual changes, wkns, changes in mental status. All other systems reviewed and are otherwise negative except as noted above.     Objective   Physical Exam    Blood pressure (!) 160/94, pulse 64, temperature 97.9 F (36.6 C), temperature source Oral, resp. rate 20, height 5' 4 (1.626 m), weight 103 kg, SpO2 99%.  General: Pleasant, NAD Psych: Normal affect. Neuro: Alert and oriented X 3. Moves all extremities spontaneously. HEENT: Normal  Neck: Supple, obese, difficult to gauge JVP.  No bruits. Lungs:  Resp regular and unlabored, diminished breath sounds in bilateral bases, otherwise clear to auscultation. Heart: RRR 2/6 systolic murmur throughout.  No rubs or gallops. Abdomen: Obese, protuberant, semifirm, nontender, bowel sounds present x 4.   Extremities: No clubbing, cyanosis or edema. DP/PT2+, Radials 2+ and equal bilaterally.  Labs    Cardiac Enzymes Recent Labs  Lab 05/17/24 0115 05/17/24 0306  TROPONINIHS 44* 59*     BNP    Component Value Date/Time   BNP 32.4 05/17/2024 0115   BNP 65 07/01/2020 1016    Lab Results  Component Value Date   WBC 11.0 (H) 05/17/2024   HGB 15.0 05/17/2024   HCT 44.7 05/17/2024   MCV 85.6 05/17/2024   PLT 174 05/17/2024    Recent Labs  Lab 05/17/24 0115  NA 137  K 3.5  CL 101  CO2 24  BUN 15  CREATININE 1.47*  CALCIUM  8.7*  GLUCOSE 360*   Lab Results  Component Value Date   CHOL 193 04/21/2024   HDL 47 04/21/2024   LDLCALC 83 04/21/2024   TRIG 314 (H) 04/21/2024   Lab Results  Component Value Date   DDIMER 0.29 01/15/2024    Radiology Studies    DG Chest 2 View  Result Date: 05/17/2024 CLINICAL DATA:  Chest pain. EXAM: DG CHEST 2V COMPARISON:  April 12, 2024 FINDINGS: The heart size and mediastinal contours are within normal limits. Both lungs are clear.  Postoperative changes are seen within the upper thoracic spine and visualized portion of the cervical spine. IMPRESSION: No active cardiopulmonary disease. Electronically Signed   By: Suzen Dials M.D.   On: 05/17/2024 01:09      ECG & Cardiac Imaging    regular sinus rhythm at 98 with inferolateral ST depression and delayed R wave progression - personally reviewed.  Assessment & Plan    1.  Unstable angina/CAD/elevated troponin: Patient with multiple admissions for chest pain and mild troponin elevations over the past year.  Diagnostic catheterizations in August 2024 and again in January 2025 show stable, moderate LAD and obtuse marginal disease with normal RFR in the LAD in August 2024.  He has been medically managed with beta-blocker and calcium  channel blocker therapy.  He has a prior reaction to intravenous nitroglycerin  and thus has not been tried on long-acting nitrates.  Patient is accustomed to experiencing some amount of chest pain on a daily basis, which typically resolves with rest however, he presented last night due to more constant predominantly upper chest pressure though there was a component of tenderness as well as a pleuritic component involving his entire chest.  Troponins are again elevated, currently peaked at 59 at 3 AM.  ECG w/o acute ST/T changes.  Chest pain is currently 3/10 and is slightly worsened with palpation over his precordium.  Follow-up troponin and check ESR and CRP given pleuritic component.  Repeat echo to evaluate for new wall motion abnormalities or effusion.  I will add Ranexa  500 mg twice daily.  Further recommendations pending repeat troponin and echo.  Continue beta-blocker, aspirin , statin.  Titrate ARB for ongoing hypertension.  2.  Chronic HFpEF: EF 55-60% by echo in 10/2023.  Multiple admissions over the past year with hypertension and HFpEF though currently appears euvolemic.  Increasing irbesartan  to 150 mg daily in the setting of ongoing  hypertension.  Previously did not tolerate Entresto .  Continue beta-blocker, spironolactone , torsemide , and SGLT2 inhibitor.  Would benefit from GLP-1 a in the outpatient setting.  3.  Stage IIIa chronic kidney disease: Creatinine stable at 1.47.  Continue ARB and SGLT2 inhibitor.  4.  Insulin -dependent diabetes mellitus: Glucose 316 on admission.  He is on insulin  and empagliflozin .  Management per medicine.  5.  Hypertension: Pressure elevated on presentation.  He is on amlodipine  10 mg daily, bisoprolol  10 mg daily, irbesartan  75 mg daily, spironolactone  25 mg daily, and torsemide  40 mg daily.  He was previously on carvedilol  but this was switched to bisoprolol  due to concern regarding wheezing and COPD.  I will titrate irbesartan  to 150 mg daily.  Unfortunately, he did not tolerate Entresto  previously.  6.  Hyperlipidemia: LDL of 83 in July on rosuvastatin  and ezetimibe .  Assuming he is compliant, will likely need PCSK9 inhibitor in the outpatient setting.  7.  Tobacco abuse: 35-year history of tobacco abuse, currently smoking 2 to 4 cigarettes/day.  Complete cessation advised.  8.  Morbid obesity: Would likely benefit from SGLT2 inhibitor in the outpatient setting.  9.  Obstructive sleep apnea: Noncompliant with CPAP at home.  Risk Assessment/Risk Scores:     TIMI Risk Score for Unstable Angina or Non-ST Elevation MI:   The patient's TIMI risk score is 5, which indicates a 26% risk of all cause mortality, new or  recurrent myocardial infarction or need for urgent revascularization in the next 14 days.      Signed, Lonni Meager, NP 05/17/2024, 10:02 AM  For questions or updates, please contact   Please consult [email or web address] for contact info under Cardiology/STEMI.

## 2024-05-17 NOTE — H&P (Signed)
 History and Physical    Patient: Cory Rivas FMW:[id number] DOB: 1966/04/29 DOA: 05/17/2024 DOS: the patient was seen and examined on 05/17/2024 PCP: Leavy Mole, PA-C  Patient coming from: Home  Chief Complaint:  Chief Complaint  Patient presents with   Chest Pain    HPI: Cory Rivas is a 58 y.o. male with medical history significant for HTN, HLD, COPD, chronic HFpEF, COPD, gout, DM, and morbid obesity being admitted for chest pain rule out.  He was in his usual state of health until around 3 PM on 8/2 when he developed retrosternal chest tightness radiating to the throat with associated numbness in the left arm.  Onset was while he was driving in his tractor.  He took baby aspirin  which improved the pain however it returned around 7 PM and it was more intense prompting him to come into the emergency room to be evaluated.  He denied associated diaphoresis, nausea or vomiting or shortness of breath.  Of note patient had a right and left heart cath in January 2025(unchanged from 2024) which showed moderate LAD and OM1 disease and mild pulmonary hypertension.  Recommendation at the time was to increase amlodipine  for antianginal therapy and BP control and aggressive secondary prevention. On arrival in the ED BP was 161/103 with otherwise normal vitals. Labs notable for troponin 44--> 59.  Blood glucose elevated at 360.  Creatinine at baseline at 1.47 WBC normal. EKG, personally viewed and interpreted showing NSR at 98 with ST depression D2-D3 and aVF as well as V5 and V6 Chest x-ray nonacute Patient was chest pain-free on arrival to the ED.  No treatment administered. Admission requested     Review of Systems: As mentioned in the history of present illness. All other systems reviewed and are negative.  Past Medical History:  Diagnosis Date   Bulging of cervical intervertebral disc    CHF (congestive heart failure) (HCC)    COPD (chronic obstructive pulmonary disease) (HCC)     Diabetes mellitus without complication (HCC)    Gout    High cholesterol    Hypertension    Stress due to family tension 04/15/2019   Past Surgical History:  Procedure Laterality Date   BACK SURGERY     CARDIAC CATHETERIZATION     CHOLECYSTECTOMY     COLONOSCOPY WITH PROPOFOL  N/A 03/10/2020   Procedure: COLONOSCOPY WITH PROPOFOL ;  Surgeon: Jinny Carmine, MD;  Location: [facility];  Service: Endoscopy;  Laterality: N/A;   COLONOSCOPY WITH PROPOFOL  N/A 09/25/2023   Procedure: COLONOSCOPY WITH PROPOFOL ;  Surgeon: Jinny Carmine, MD;  Location: [facility];  Service: Endoscopy;  Laterality: N/A;   CORONARY PRESSURE/FFR STUDY N/A 05/29/2023   Procedure: CORONARY PRESSURE/FFR STUDY;  Surgeon: Mady Bruckner, MD;  Location: [facility];  Service: Cardiovascular;  Laterality: N/A;   POLYPECTOMY  09/25/2023   Procedure: POLYPECTOMY;  Surgeon: Jinny Carmine, MD;  Location: [facility];  Service: Endoscopy;;   RIGHT/LEFT HEART CATH AND CORONARY ANGIOGRAPHY N/A 05/29/2023   Procedure: RIGHT/LEFT HEART CATH AND CORONARY ANGIOGRAPHY;  Surgeon: Mady Bruckner, MD;  Location: [facility];  Service: Cardiovascular;  Laterality: N/A;   RIGHT/LEFT HEART CATH AND CORONARY ANGIOGRAPHY Bilateral 10/30/2023   Procedure: RIGHT/LEFT HEART CATH AND CORONARY ANGIOGRAPHY;  Surgeon: Mady Bruckner, MD;  Location: [facility];  Service: Cardiovascular;  Laterality: Bilateral;   Social History:  reports that he has been smoking cigarettes. He started smoking about 34 years ago. He has a 37 pack-year smoking history. He  has never used smokeless tobacco. He reports that he does not drink alcohol and does not use drugs.  Allergies  Allergen Reactions   Oxycodone  Nausea And Vomiting   Entresto  [Sacubitril -Valsartan ] Nausea Only and Other (See Comments)    Lightheaded and dizzy    Onion Nausea And Vomiting   Nitroglycerin  Itching and Rash    Family History  Problem Relation Age  of Onset   Heart disease Mother    Diabetes Mother    Hyperlipidemia Mother    Hypertension Mother    Heart attack Mother    Lung cancer Father    Hypertension Father    Stroke Father    AAA (abdominal aortic aneurysm) Maternal Grandmother    Heart attack Maternal Grandmother    Diabetes Maternal Grandfather     Prior to Admission medications   Medication Sig Start Date End Date Taking? Authorizing Provider  allopurinol  (ZYLOPRIM ) 100 MG tablet Take 1 tablet (100 mg total) by mouth daily with the 300mg  tablet for a total daily dose of 400mg . Patient taking differently: Take 100 mg by mouth daily as needed. 04/15/24   Tapia, Leisa, PA-C  allopurinol  (ZYLOPRIM ) 300 MG tablet Take 1 tablet (300 mg total) by mouth daily with 100 mg tablet for a total of 400mg  daily for gout. Patient taking differently: Take 300 mg by mouth daily as needed. 04/14/24   Tapia, Leisa, PA-C  amLODipine  (NORVASC ) 10 MG tablet Take 1 tablet (10 mg total) by mouth daily. 10/30/23   End, Lonni, MD  aspirin  EC 81 MG tablet Take 1 tablet (81 mg total) by mouth daily. Swallow whole. 11/11/23   Tapia, Leisa, PA-C  baclofen  (LIORESAL ) 10 MG tablet Take 1 tablet (10 mg total) by mouth 3 (three) times daily as needed for muscle spasms. Patient not taking: Reported on 05/15/2024 01/27/24   Tapia, Leisa, PA-C  bisoprolol  (ZEBETA ) 5 MG tablet Take 2 tablets (10 mg total) by mouth daily. 04/02/24   Malvina Alm DASEN, MD  budesonide -formoterol  (SYMBICORT ) 160-4.5 MCG/ACT inhaler Inhale 2 puffs into the lungs 2 (two) times daily. 10/24/23   Von Bellis, MD  cetirizine  (ZYRTEC ) 10 MG tablet Take 1 tablet (10 mg total) by mouth daily. 05/15/24   [name], DO  cromolyn  (OPTICROM ) 4 % ophthalmic solution Place 1 drop into both eyes 4 (four) times daily as needed. Patient not taking: Reported on 05/15/2024 02/10/24   Tapia, Leisa, PA-C  Dulaglutide  (TRULICITY ) 3 MG/0.5ML SOAJ Inject 3 mg into the skin once a week. 09/05/23      DULoxetine  (CYMBALTA ) 30 MG capsule Take 2 capsules (60 mg total) by mouth every morning AND 1 capsule (30 mg total) every evening. 11/11/23   Tapia, Leisa, PA-C  empagliflozin  (JARDIANCE ) 10 MG TABS tablet Take 1 tablet (10 mg total) by mouth daily. 11/11/23   Tapia, Leisa, PA-C  Evolocumab  (REPATHA ) 140 MG/ML SOSY Inject 140 mg into the skin every 14 (fourteen) days. 12/21/23   Dunn, Bernardino HERO, PA-C  ezetimibe  (ZETIA ) 10 MG tablet Take 1 tablet (10 mg total) by mouth daily. 11/11/23   Tapia, Leisa, PA-C  fluticasone  (FLONASE ) 50 MCG/ACT nasal spray Place 1 spray into both nostrils daily. 02/10/24 02/09/25  Tapia, Leisa, PA-C  Glucagon  (GVOKE HYPOPEN  1-PACK) 1 MG/0.2ML SOAJ Inject 1 mg into the skin as needed. Patient not taking: Reported on 05/15/2024 11/01/23   Tapia, Leisa, PA-C  ipratropium-albuterol  (DUONEB) 0.5-2.5 (3) MG/3ML SOLN Take 3 mLs by nebulization every 6 (six) hours as needed. 02/10/24  Tapia, Leisa, PA-C  levocetirizine (XYZAL ) 5 MG tablet Take 1 tablet (5 mg total) by mouth every evening. 02/10/24   Tapia, Leisa, PA-C  nicotine  (NICODERM CQ  - DOSED IN MG/24 HR) 7 mg/24hr patch Place 1 patch (7 mg total) onto the skin daily. Patient not taking: Reported on 05/15/2024 03/06/24   Tapia, Leisa, PA-C  nicotine  polacrilex (NICOTINE  MINI) 4 MG lozenge Take 1 lozenge (4 mg total) by mouth as needed. 03/06/24   Tapia, Leisa, PA-C  pantoprazole  (PROTONIX ) 40 MG tablet Take 1 tablet (40 mg total) by mouth daily. Patient not taking: Reported on 05/15/2024 09/16/23   Gareth Mliss FALCON, FNP  pregabalin  (LYRICA ) 50 MG capsule Take 1 capsule (50 mg total) by mouth 2 (two) times daily. 11/11/23   Tapia, Leisa, PA-C  rosuvastatin  (CRESTOR ) 40 MG tablet Take 1 tablet (40 mg total) by mouth daily. 11/11/23   Tapia, Leisa, PA-C  spironolactone  (ALDACTONE ) 25 MG tablet Take 1 tablet (25 mg total) by mouth daily. 01/29/24 08/13/24  Donette Ellouise LABOR, FNP  Tiotropium Bromide  Monohydrate (SPIRIVA  RESPIMAT) 2.5 MCG/ACT AERS Inhale  2 puffs into the lungs daily. 11/05/23   Isadora Hose, MD  torsemide  (DEMADEX ) 20 MG tablet Take 2 tablets (40 mg total) by mouth daily. 03/03/24   Donette Ellouise LABOR, FNP  valsartan  (DIOVAN ) 80 MG tablet Take 1 tablet (80 mg total) by mouth daily. 03/17/24   Donette Ellouise LABOR, FNP  VENTOLIN  HFA 108 (90 Base) MCG/ACT inhaler Inhale 2 puffs into the lungs every 6 (six) hours as needed for wheezing or shortness of breath. 03/04/24   Leavy Mole, PA-C    Physical Exam: Vitals:   05/17/24 0045 05/17/24 0304 05/17/24 0400 05/17/24 0418  BP: (!) 161/103 (!) 141/87 (!) 146/86 (!) 146/86  Pulse: 86   86  Resp: 18  18 18   Temp: 98.5 F (36.9 C)   98.5 F (36.9 C)  TempSrc: Oral   Oral  SpO2: 96%   97%  Weight:      Height:       Physical Exam Vitals and nursing note reviewed.  Constitutional:      General: He is not in acute distress. HENT:     Head: Normocephalic and atraumatic.  Cardiovascular:     Rate and Rhythm: Normal rate and regular rhythm.     Heart sounds: Normal heart sounds.  Pulmonary:     Effort: Pulmonary effort is normal.     Breath sounds: Normal breath sounds.  Abdominal:     Palpations: Abdomen is soft.     Tenderness: There is no abdominal tenderness.  Neurological:     Mental Status: Mental status is at baseline.     Labs on Admission: I have personally reviewed following labs and imaging studies  CBC: Recent Labs  Lab 05/17/24 0115  WBC 11.0*  HGB 15.0  HCT 44.7  MCV 85.6  PLT 174   Basic Metabolic Panel: Recent Labs  Lab 05/17/24 0115  NA 137  K 3.5  CL 101  CO2 24  GLUCOSE 360*  BUN 15  CREATININE 1.47*  CALCIUM  8.7*   GFR: Estimated Creatinine Clearance: 60.1 mL/min (A) (by C-G formula based on SCr of 1.47 mg/dL (H)). Liver Function Tests: No results for input(s): AST, ALT, ALKPHOS, BILITOT, PROT, ALBUMIN in the last 168 hours. No results for input(s): LIPASE, AMYLASE in the last 168 hours. No results for input(s):  AMMONIA in the last 168 hours. Coagulation Profile: No results for input(s): INR, PROTIME  in the last 168 hours. Cardiac Enzymes: No results for input(s): CKTOTAL, CKMB, CKMBINDEX, TROPONINI in the last 168 hours. BNP (last 3 results) No results for input(s): PROBNP in the last 8760 hours. HbA1C: No results for input(s): HGBA1C in the last 72 hours. CBG: No results for input(s): GLUCAP in the last 168 hours. Lipid Profile: No results for input(s): CHOL, HDL, LDLCALC, TRIG, CHOLHDL, LDLDIRECT in the last 72 hours. Thyroid Function Tests: No results for input(s): TSH, T4TOTAL, FREET4, T3FREE, THYROIDAB in the last 72 hours. Anemia Panel: No results for input(s): VITAMINB12, FOLATE, FERRITIN, TIBC, IRON, RETICCTPCT in the last 72 hours. Urine analysis:    Component Value Date/Time   COLORURINE YELLOW (A) 03/25/2023 1236   APPEARANCEUR HAZY (A) 03/25/2023 1236   APPEARANCEUR Clear 04/28/2014 2040   LABSPEC 1.027 03/25/2023 1236   LABSPEC 1.030 04/28/2014 2040   PHURINE 5.0 03/25/2023 1236   GLUCOSEU 150 (A) 03/25/2023 1236   GLUCOSEU Negative 04/28/2014 2040   HGBUR NEGATIVE 03/25/2023 1236   BILIRUBINUR NEGATIVE 03/25/2023 1236   BILIRUBINUR Negative 06/27/2022 1527   BILIRUBINUR Negative 04/28/2014 2040   KETONESUR NEGATIVE 03/25/2023 1236   PROTEINUR >=300 (A) 03/25/2023 1236   UROBILINOGEN 0.2 06/27/2022 1527   NITRITE NEGATIVE 03/25/2023 1236   LEUKOCYTESUR NEGATIVE 03/25/2023 1236   LEUKOCYTESUR Negative 04/28/2014 2040    Radiological Exams on Admission: DG Chest 2 View Result Date: 05/17/2024 CLINICAL DATA:  Chest pain. EXAM: DG CHEST 2V COMPARISON:  April 12, 2024 FINDINGS: The heart size and mediastinal contours are within normal limits. Both lungs are clear. Postoperative changes are seen within the upper thoracic spine and visualized portion of the cervical spine. IMPRESSION: No active cardiopulmonary disease.  Electronically Signed   By: Suzen Dials M.D.   On: 05/17/2024 01:09   Data Reviewed for HPI: Relevant notes from primary care and specialist visits, past discharge summaries as available in EHR, including Care Everywhere. Prior diagnostic testing as pertinent to current admission diagnoses Updated medications and problem lists for reconciliation ED course, including vitals, labs, imaging, treatment and response to treatment Triage notes, nursing and pharmacy notes and ED provider's notes Notable results as noted above in HPI      Assessment and Plan: * Unstable angina (HCC) Moderate CAD on left heart cath January 2025 Nitroglycerin  intolerance Continue medical therapy with aspirin , statin, bisoprolol , Diovan  Continue amlodipine  for anginal symptoms Morphine  for pain Cardiology consult for any additional recommendations   COPD with asthma (HCC) Pulmonary hypertension on right heart cath January 2025 Tobacco use disorder Continue home inhalers Albuterol  as needed Nicotine  patch  Chronic diastolic CHF (congestive heart failure) (HCC) Clinically euvolemic Continue GDMT with bisoprolol , spironolactone , Jardiance  and valsartan  Continue torsemide   Essential hypertension Continue amlodipine , bisoprolol   Uncontrolled diabetes mellitus with hyperglycemia, with long-term current use of insulin  (HCC) Blood sugar elevated to 380 Sliding scale insulin  coverage  Chronic kidney disease, stage 3a (HCC) At baseline      DVT prophylaxis: Lovenox   Consults: [facility] cardiology  Advance Care Planning:   Code Status: Prior   Family Communication: none  Disposition Plan: Back to previous home environment  Severity of Illness: The appropriate patient status for this patient is OBSERVATION. Observation status is judged to be reasonable and necessary in order to provide the required intensity of service to ensure the patient's safety. The patient's presenting symptoms, physical  exam findings, and initial radiographic and laboratory data in the context of their medical condition is felt to place them at decreased risk for  further clinical deterioration. Furthermore, it is anticipated that the patient will be medically stable for discharge from the hospital within 2 midnights of admission.   Author: Delayne LULLA Solian, MD 05/17/2024 5:02 AM  For on call review [email or web address].

## 2024-05-17 NOTE — ED Triage Notes (Signed)
 Pt to ED via POV c/o central CP that started yesterday afternoon. Pain radiates to left arm. Pt took some aspirin  at home. Pain worsened when he tried to go to sleep. Denies any SOB, fevers, dizziness. Hx COPD, CHF, HTN

## 2024-05-17 NOTE — Discharge Summary (Signed)
 Physician Discharge Summary   Cory Rivas  male DOB: 1966/07/14  FMW:[id number]  PCP: Leavy Mole, PA-C  Admit date: 05/17/2024 Discharge date: 05/17/2024  Admitted From: home Disposition:  home CODE STATUS: Full code  Discharge Instructions     Diet - low sodium heart healthy   Complete by: As directed       Hospital Course:  For full details, please see H&P, progress notes, consult notes and ancillary notes.  Briefly,  Cory Rivas is a 58 y.o. male with medical history significant for HTN, HLD, COPD, chronic HFpEF, COPD, DM, and morbid obesity being admitted for chest pain rule out.    He was in his usual state of health until around 3 PM on 8/2 when he developed retrosternal chest tightness radiating to the throat with associated numbness in the left arm.  Onset was while he was driving in his tractor.  He took baby aspirin  which improved the pain however it returned around 7 PM and it was more intense prompting him to come into the emergency room to be evaluated.    Of note patient had a right and left heart cath in January 2025 (unchanged from 2024) which showed moderate LAD and OM1 disease and mild pulmonary hypertension.  Recommendation at the time was to increase amlodipine  for antianginal therapy and BP control and aggressive secondary prevention.   * Chest pain, stable angina, ACS ruled out Moderate CAD on left heart cath January 2025 Nitroglycerin  intolerance --trop 60's and flat.  Cardio consulted. -Started Ranexa  500 mg twice daily --Continue aspirin  81 mg daily, bisoprolol  10 mg daily, Crestor  40 mg daily. --chest pain resolved after presentation, pt asked to go home, and cardio cleared for discharge.  COPD with asthma (HCC) Tobacco use disorder Continue home inhalers --cont nicotine  lozenge   Chronic diastolic CHF (congestive heart failure) (HCC) Clinically euvolemic Continue GDMT with home bisoprolol , spironolactone , Jardiance  and  valsartan  Continue home torsemide    Essential hypertension Continue home regimen as below   Uncontrolled diabetes mellitus with hyperglycemia --no insulin  on home med list   Chronic kidney disease, stage 3a (HCC) At baseline   Unless noted above, medications under STOP list are ones pt was not taking PTA.  Discharge Diagnoses:  Principal Problem:   Unstable angina (HCC) Active Problems:   CAD (coronary artery disease)   COPD with asthma (HCC)   Essential hypertension   Chronic diastolic CHF (congestive heart failure) (HCC)   Uncontrolled diabetes mellitus with hyperglycemia, with long-term current use of insulin  (HCC)   Chronic kidney disease, stage 3a (HCC)     Discharge Instructions:  Allergies as of 05/17/2024       Reactions   Oxycodone  Nausea And Vomiting   Entresto  [sacubitril -valsartan ] Nausea Only, Other (See Comments)   Lightheaded and dizzy    Onion Nausea And Vomiting   Nitroglycerin  Itching, Rash   IV product, patient says SL tablet is fine        Medication List     STOP taking these medications    Gvoke HypoPen  1-Pack 1 MG/0.2ML Soaj Generic drug: Glucagon    nicotine  7 mg/24hr patch Commonly known as: NICODERM CQ  - dosed in mg/24 hr   pantoprazole  40 MG tablet Commonly known as: PROTONIX    Repatha  140 MG/ML Sosy Generic drug: Evolocumab        TAKE these medications    allopurinol  100 MG tablet Commonly known as: ZYLOPRIM  Take 1 tablet (100 mg total) by mouth daily as needed (Gout flare).  Home med. What changed:  when to take this reasons to take this additional instructions   allopurinol  300 MG tablet Commonly known as: ZYLOPRIM  Take 1 tablet (300 mg total) by mouth daily as needed (Gout flare). Home med. What changed:  when to take this reasons to take this additional instructions   amLODipine  10 MG tablet Commonly known as: NORVASC  Take 1 tablet (10 mg total) by mouth daily.   Aspirin  Low Dose 81 MG tablet Generic  drug: aspirin  EC Take 1 tablet (81 mg total) by mouth daily. Swallow whole.   baclofen  10 MG tablet Commonly known as: LIORESAL  Take 1 tablet (10 mg total) by mouth 3 (three) times daily as needed for muscle spasms.   bisoprolol  5 MG tablet Commonly known as: ZEBETA  Take 2 tablets (10 mg total) by mouth daily.   cetirizine  10 MG tablet Commonly known as: ZYRTEC  Take 1 tablet (10 mg total) by mouth daily.   cromolyn  4 % ophthalmic solution Commonly known as: OPTICROM  Place 1 drop into both eyes 4 (four) times daily as needed.   DULoxetine  30 MG capsule Commonly known as: CYMBALTA  Take 2 capsules (60 mg total) by mouth every morning AND 1 capsule (30 mg total) every evening.   ezetimibe  10 MG tablet Commonly known as: ZETIA  Take 1 tablet (10 mg total) by mouth daily.   fluticasone  50 MCG/ACT nasal spray Commonly known as: FLONASE  Place 1 spray into both nostrils daily. What changed: how much to take   ipratropium-albuterol  0.5-2.5 (3) MG/3ML Soln Commonly known as: DUONEB Take 3 mLs by nebulization every 6 (six) hours as needed.   Jardiance  10 MG Tabs tablet Generic drug: empagliflozin  Take 1 tablet (10 mg total) by mouth daily.   levocetirizine 5 MG tablet Commonly known as: XYZAL  Take 1 tablet (5 mg total) by mouth every evening.   nicotine  polacrilex 4 MG lozenge Commonly known as: Nicotine  Mini Take 1 lozenge (4 mg total) by mouth as needed.   pregabalin  50 MG capsule Commonly known as: LYRICA  Take 1 capsule (50 mg total) by mouth 2 (two) times daily as needed. Home med. What changed:  when to take this reasons to take this additional instructions   ranolazine  500 MG 12 hr tablet Commonly known as: RANEXA  Take 1 tablet (500 mg total) by mouth 2 (two) times daily.   rosuvastatin  40 MG tablet Commonly known as: CRESTOR  Take 1 tablet (40 mg total) by mouth daily.   Spiriva  Respimat 2.5 MCG/ACT Aers Generic drug: Tiotropium Bromide  Monohydrate Inhale 2  puffs into the lungs daily.   spironolactone  25 MG tablet Commonly known as: ALDACTONE  Take 1 tablet (25 mg total) by mouth daily.   Symbicort  160-4.5 MCG/ACT inhaler Generic drug: budesonide -formoterol  Inhale 2 puffs into the lungs 2 (two) times daily.   torsemide  20 MG tablet Commonly known as: DEMADEX  Take 2 tablets (40 mg total) by mouth daily.   Trulicity  3 MG/0.5ML Soaj Generic drug: Dulaglutide  Inject 3 mg into the skin once a week.   valsartan  80 MG tablet Commonly known as: Diovan  Take 1 tablet (80 mg total) by mouth daily.   Ventolin  HFA 108 (90 Base) MCG/ACT inhaler Generic drug: albuterol  Inhale 2 puffs into the lungs every 6 (six) hours as needed for wheezing or shortness of breath.         Follow-up Information     End, Lonni, MD. Schedule an appointment as soon as possible for a visit .   Specialty: Cardiology Contact information: [address] [place] [address] [phone]                 Allergies  Allergen Reactions   Oxycodone  Nausea And Vomiting   Entresto  [Sacubitril -Valsartan ] Nausea Only and Other (See Comments)    Lightheaded and dizzy    Onion Nausea And Vomiting   Nitroglycerin  Itching and Rash    IV product, patient says SL tablet is fine     The results of significant diagnostics from this hospitalization (including imaging, microbiology, ancillary and laboratory) are listed below for reference.   Consultations:   Procedures/Studies: DG Chest 2 View Result Date: 05/17/2024 CLINICAL DATA:  Chest pain. EXAM: DG CHEST 2V COMPARISON:  April 12, 2024 FINDINGS: The heart size and mediastinal contours are within normal limits. Both lungs are clear. Postoperative changes are seen within the upper thoracic spine and visualized portion of the cervical spine. IMPRESSION: No active cardiopulmonary disease. Electronically Signed   By: Suzen Dials M.D.   On: 05/17/2024 01:09      Labs: BNP (last 3  results) Recent Labs    04/03/24 1240 04/12/24 1438 05/17/24 0115  BNP 320.0* 197.9* 32.4   Basic Metabolic Panel: Recent Labs  Lab 05/17/24 0115  NA 137  K 3.5  CL 101  CO2 24  GLUCOSE 360*  BUN 15  CREATININE 1.47*  CALCIUM  8.7*   Liver Function Tests: No results for input(s): AST, ALT, ALKPHOS, BILITOT, PROT, ALBUMIN in the last 168 hours. No results for input(s): LIPASE, AMYLASE in the last 168 hours. No results for input(s): AMMONIA in the last 168 hours. CBC: Recent Labs  Lab 05/17/24 0115  WBC 11.0*  HGB 15.0  HCT 44.7  MCV 85.6  PLT 174   Cardiac Enzymes: No results for input(s): CKTOTAL, CKMB, CKMBINDEX, TROPONINI in the last 168 hours. BNP: Invalid input(s): POCBNP CBG: Recent Labs  Lab 05/17/24 0756 05/17/24 1139 05/17/24 1339  GLUCAP 213* 311* 218*   D-Dimer No results for input(s): DDIMER in the last 72 hours. Hgb A1c No results for input(s): HGBA1C in the last 72 hours. Lipid Profile No results for input(s): CHOL, HDL, LDLCALC, TRIG, CHOLHDL, LDLDIRECT in the last 72 hours. Thyroid function studies No results for input(s): TSH, T4TOTAL, T3FREE, THYROIDAB in the last 72 hours.  Invalid input(s): FREET3 Anemia work up No results for input(s): VITAMINB12, FOLATE, FERRITIN, TIBC, IRON, RETICCTPCT in the last 72 hours. Urinalysis    Component Value Date/Time   COLORURINE YELLOW (A) 03/25/2023 1236   APPEARANCEUR HAZY (A) 03/25/2023 1236   APPEARANCEUR Clear 04/28/2014 2040   LABSPEC 1.027 03/25/2023 1236   LABSPEC 1.030 04/28/2014 2040   PHURINE 5.0 03/25/2023 1236   GLUCOSEU 150 (A) 03/25/2023 1236   GLUCOSEU Negative 04/28/2014 2040   HGBUR NEGATIVE 03/25/2023 1236   BILIRUBINUR NEGATIVE 03/25/2023 1236   BILIRUBINUR Negative 06/27/2022 1527   BILIRUBINUR Negative 04/28/2014 2040   KETONESUR NEGATIVE 03/25/2023 1236   PROTEINUR >=300 (A) 03/25/2023 1236    UROBILINOGEN 0.2 06/27/2022 1527   NITRITE NEGATIVE 03/25/2023 1236   LEUKOCYTESUR NEGATIVE 03/25/2023 1236   LEUKOCYTESUR Negative 04/28/2014 2040   Sepsis Labs Recent Labs  Lab 05/17/24 0115  WBC 11.0*   Microbiology No results found for this or any previous visit (from the past 240 hours).   Total time spend on discharging this patient, including the last patient exam, discussing the hospital stay, instructions for ongoing care as it relates to all pertinent caregivers, as well as preparing the  medical discharge records, prescriptions, and/or referrals as applicable, is 40 minutes.    Ellouise Haber, MD  [facility] 05/17/2024, 3:17 PM

## 2024-05-17 NOTE — Assessment & Plan Note (Signed)
 At baseline

## 2024-05-17 NOTE — Assessment & Plan Note (Addendum)
 Clinically euvolemic Continue GDMT with bisoprolol , spironolactone , Jardiance  and valsartan  Continue torsemide 

## 2024-05-17 NOTE — Assessment & Plan Note (Signed)
 Blood sugar elevated to 380 Sliding scale insulin  coverage

## 2024-05-18 ENCOUNTER — Other Ambulatory Visit: Payer: Self-pay

## 2024-05-18 NOTE — Therapy (Unsigned)
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 (HCC); Nicotine  dependence; Class 3 obesity; Acute on chronic diastolic (congestive) heart failure (HCC); Chronic kidney disease, stage 3a (HCC); and Stable angina (HCC) on their problem list. he  has a past medical history of Bulging of cervical intervertebral disc, CAD (coronary artery disease), Cervical myelopathy (HCC), Chronic heart failure with preserved ejection fraction (HFpEF) (HCC), CKD (chronic kidney disease), stage III (HCC), COPD (chronic obstructive pulmonary disease) (HCC), Gout, Hyperlipidemia LDL goal <70, Hypertension, Morbid obesity (HCC), OSA (obstructive sleep apnea), Stress due to family tension (04/15/2019), and Tracheobronchomalacia. he  has a past surgical history that includes Cholecystectomy; Colonoscopy with propofol  (N/A, 03/10/2020); RIGHT/LEFT HEART CATH AND CORONARY ANGIOGRAPHY (N/A, 05/29/2023); CORONARY  PRESSURE/FFR STUDY (N/A, 05/29/2023); Cardiac catheterization; Back surgery; Colonoscopy with propofol  (N/A, 09/25/2023); polypectomy (09/25/2023); and RIGHT/LEFT HEART CATH AND CORONARY ANGIOGRAPHY (Bilateral, 10/30/2023) are also affecting patient's functional outcome.   REHAB POTENTIAL: Fair  due to severity and complexity of condition and past experiences. Good rehab potential to improve to where he was at last PT discharge.   CLINICAL DECISION MAKING: Evolving/moderate complexity  EVALUATION COMPLEXITY: Moderate   GOALS: Goals reviewed with patient? No  SHORT TERM GOALS: Target date: 06/02/2024  Patient will be independent with initial home exercise program for self-management of symptoms. Baseline: Initial HEP provided at IE (05/19/24); Goal status: INITIAL   LONG TERM GOALS: Target date: 08/11/2024  Patient will be independent with a long-term home exercise program for self-management of symptoms.  Baseline: Initial HEP provided at IE (05/19/24); Goal status: INITIAL  2.  Patient will demonstrate improved Neck Disability Index (NDI) to equal or less than 10% to demonstrate improvement in overall condition and self-reported functional ability.  Baseline: 58% (05/19/24); Goal status: INITIAL  3.  Patient will demonstrate increased UE myotomes by equal or greater than 1/2 MMT or up to 4+/5 to improve his ability to use his B UE for riding the tractor and completing household activities.  Baseline: as low as 3/5  (05/19/24); Goal status: INITIAL  4.  Patient will demonstrate improved cervical spine rotation to 40 degrees bilaterally to maximize his use of the AA ROM and view his surroundings more easily.  Baseline: R/L 15/34 (05/19/24); Goal status: INITIAL  5.  Patient will demonstrate improvement in Patient Specific Functional Scale (PSFS) of equal or greater than 8/10 points to reflect clinically significant improvement in patient's most valued functional  activities. Baseline: to be measured at visit 2 as appropriate (05/19/24); Goal status: INITIAL    PLAN:  PT FREQUENCY: 2x/week  PT DURATION: 8-12 weeks  PLANNED INTERVENTIONS: 02835- PT Re-evaluation, 97750- Physical Performance Testing, 97110-Therapeutic exercises, 97530- Therapeutic activity, 97112- Neuromuscular re-education, 97535- Self Care, 02859- Manual therapy, [phone]- Aquatic Therapy, [phone]- Electrical stimulation (unattended), [phone] (1-2 muscles), 20561 (3+ muscles)- Dry Needling, Patient/Family education, Joint mobilization, Spinal mobilization, Cryotherapy, and Moist heat  PLAN FOR NEXT SESSION: update HEP as appropriate, educate on mechanical stressors and go through day to help with education on alleviation of these for the short term, exercise to improve intrascapular activation and strength for more balanced force couple and decrease over-reliance on UT, interventions to improve thoracic and AA/OA mobility. education   Camie SAUNDERS. Juli, PT, DPT, Cert. MDT 05/19/24, 12:21 PM  [facility] [address] [place], [place] [address] P: [phone] I F: [phone]

## 2024-05-19 ENCOUNTER — Encounter: Payer: Self-pay | Admitting: Physical Therapy

## 2024-05-19 ENCOUNTER — Ambulatory Visit: Admitting: Physical Therapy

## 2024-05-19 VITALS — BP 154/67 | HR 63

## 2024-05-19 DIAGNOSIS — R293 Abnormal posture: Secondary | ICD-10-CM | POA: Insufficient documentation

## 2024-05-19 DIAGNOSIS — M5412 Radiculopathy, cervical region: Secondary | ICD-10-CM | POA: Diagnosis not present

## 2024-05-19 DIAGNOSIS — M6281 Muscle weakness (generalized): Secondary | ICD-10-CM | POA: Diagnosis not present

## 2024-05-19 DIAGNOSIS — M62838 Other muscle spasm: Secondary | ICD-10-CM | POA: Insufficient documentation

## 2024-05-19 DIAGNOSIS — M542 Cervicalgia: Secondary | ICD-10-CM | POA: Diagnosis not present

## 2024-05-21 ENCOUNTER — Ambulatory Visit: Admitting: Physical Therapy

## 2024-05-21 ENCOUNTER — Encounter: Payer: Self-pay | Admitting: Physical Therapy

## 2024-05-21 VITALS — BP 156/85 | HR 70

## 2024-05-21 DIAGNOSIS — R293 Abnormal posture: Secondary | ICD-10-CM | POA: Diagnosis not present

## 2024-05-21 DIAGNOSIS — M6281 Muscle weakness (generalized): Secondary | ICD-10-CM

## 2024-05-21 DIAGNOSIS — M5412 Radiculopathy, cervical region: Secondary | ICD-10-CM

## 2024-05-21 DIAGNOSIS — M62838 Other muscle spasm: Secondary | ICD-10-CM

## 2024-05-21 DIAGNOSIS — M542 Cervicalgia: Secondary | ICD-10-CM

## 2024-05-21 NOTE — Therapy (Signed)
 OUTPATIENT PHYSICAL THERAPY TREATMENT   Patient Name: Cory Rivas MRN: [id number] DOB:1966-06-14, 58 y.o., male Today's Date: 05/21/2024  END OF SESSION:  PT End of Session - 05/21/24 1002     Visit Number 2    Number of Visits 17    Date for PT Re-Evaluation 08/11/24    Authorization Type [place] [employer]ICA[employer]UE reporting period from 05/19/2024    Authorization Time Period HB auth#[id number] for 13 PT vsts from 8/7-11/5    Authorization - Visit Number 1    Authorization - Number of Visits 13    Progress Note Due on Visit 10    PT Start Time 0954    PT Stop Time 1035    PT Time Calculation (min) 41 min    Activity Tolerance Patient tolerated treatment well    Behavior During Therapy [facility] for tasks assessed/performed           Past Medical History:  Diagnosis Date   Bulging of cervical intervertebral disc    CAD (coronary artery disease)    a. 09/2020 MV: low risk; b. 05/2023 Cath: Moderate LAD dzs w/ seq 50-60% stenoses and nl RFR 0.93. Mod OM dzs->Med rx; b. 10/2023 Cath: LM min irregs, LAD 55ost, 55p/m, LCX large nl, OM1 50, RCA mod, nl.   Cervical myelopathy (HCC)    Chronic heart failure with preserved ejection fraction (HFpEF) (HCC)    a. 05/2023 Echo: EF 50-55%, no rwma, GrIII DD, nl RV fxn, mild MR, Ao sclerosis; b. 10/2023 Echo: EF 55-60%, no rwma, nl RV fxn, mild MR, Ao sclerosis.   CKD (chronic kidney disease), stage III (HCC)    COPD (chronic obstructive pulmonary disease) (HCC)    Diabetes mellitus without complication (HCC)    Gout    Hyperlipidemia LDL goal <70    Hypertension    Morbid obesity (HCC)    OSA (obstructive sleep apnea)    Stress due to family tension 04/15/2019   Tracheobronchomalacia    Past Surgical History:  Procedure Laterality Date   BACK SURGERY     CARDIAC CATHETERIZATION     CHOLECYSTECTOMY     COLONOSCOPY WITH PROPOFOL  N/A 03/10/2020   Procedure: COLONOSCOPY WITH PROPOFOL ;  Surgeon: Jinny Carmine, MD;  Location: [facility] ENDOSCOPY;   Service: Endoscopy;  Laterality: N/A;   COLONOSCOPY WITH PROPOFOL  N/A 09/25/2023   Procedure: COLONOSCOPY WITH PROPOFOL ;  Surgeon: Jinny Carmine, MD;  Location: [facility] ENDOSCOPY;  Service: Endoscopy;  Laterality: N/A;   CORONARY PRESSURE/FFR STUDY N/A 05/29/2023   Procedure: CORONARY PRESSURE/FFR STUDY;  Surgeon: Mady Bruckner, MD;  Location: [facility] INVASIVE CV [facility];  Service: Cardiovascular;  Laterality: N/A;   POLYPECTOMY  09/25/2023   Procedure: POLYPECTOMY;  Surgeon: Jinny Carmine, MD;  Location: [facility] ENDOSCOPY;  Service: Endoscopy;;   RIGHT/LEFT HEART CATH AND CORONARY ANGIOGRAPHY N/A 05/29/2023   Procedure: RIGHT/LEFT HEART CATH AND CORONARY ANGIOGRAPHY;  Surgeon: Mady Bruckner, MD;  Location: [facility];  Service: Cardiovascular;  Laterality: N/A;   RIGHT/LEFT HEART CATH AND CORONARY ANGIOGRAPHY Bilateral 10/30/2023   Procedure: RIGHT/LEFT HEART CATH AND CORONARY ANGIOGRAPHY;  Surgeon: Mady Bruckner, MD;  Location: [facility];  Service: Cardiovascular;  Laterality: Bilateral;   Patient Active Problem List   Diagnosis Date Noted   Chronic kidney disease, stage 3a (HCC) 05/17/2024   Stable angina (HCC) 05/17/2024   Acute on chronic diastolic (congestive) heart failure (HCC) 04/03/2024   Class 3 obesity 02/18/2024   Nicotine  dependence 01/23/2024   Acute pulmonary edema (HCC) 01/22/2024  CHF (congestive heart failure) (HCC) 01/20/2024   COPD exacerbation (HCC) 10/21/2023   History of colon polyps 09/25/2023   Myocardial injury 08/19/2023   Type II diabetes mellitus with renal manifestations (HCC) 08/19/2023   Obesity (BMI 30-39.9) 08/19/2023   HLD (hyperlipidemia) 08/19/2023   Chronic diastolic CHF (congestive heart failure) (HCC) 08/19/2023   Diarrhea 08/19/2023   CAD (coronary artery disease) 08/19/2023   CKD (chronic kidney disease) stage 2, GFR 60-89 ml/min 08/19/2023   Acute on chronic diastolic heart failure (HCC) 05/30/2023   Unstable angina (HCC)  05/29/2023   Pleuritic chest pain 05/27/2023   Type 2 diabetes mellitus with peripheral neuropathy (HCC) 05/27/2023   Gastroesophageal reflux disease without esophagitis 05/27/2023   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   Dyslipidemia 05/27/2023   Hypertensive emergency 11/22/2022   Acute respiratory failure with hypoxia (HCC) 11/22/2022   Elevated troponin 11/22/2022   Acute on chronic heart failure with preserved ejection fraction (HFpEF) (HCC) 08/24/2022   Acute on chronic diastolic congestive heart failure (HCC) 08/23/2022   Class 2 obesity 08/23/2022   Uncontrolled diabetes mellitus with hyperglycemia, with long-term current use of insulin  (HCC) 08/23/2022   Grade I diastolic dysfunction 06/08/2022   LVH (left ventricular hypertrophy) 06/08/2022   Accelerated hypertension 06/08/2022   S/P cervical spinal fusion 08/18/2021   Former heavy tobacco smoker 08/18/2021   Gout 08/18/2021   Other chronic pain 10/26/2020   Systolic murmur 10/26/2020   Tobacco use 10/26/2020   Polyp of ascending colon    Rectal polyp    Class 2 severe obesity with serious comorbidity and body mass index (BMI) of 39.0 to 39.9 in adult [facility]) 04/15/2019   Chronic kidney disease (CKD) stage G2/A3, mildly decreased glomerular filtration rate (GFR) between 60-89 mL/min/1.73 square meter and albuminuria creatinine ratio greater than 300 mg/g 04/15/2019   Proteinuria 04/15/2019   Hyperlipidemia associated with type 2 diabetes mellitus (HCC) 03/18/2019   Diabetes mellitus (HCC) 03/18/2019   Idiopathic chronic gout of multiple sites without tophus 03/18/2019   Essential hypertension 03/18/2019   Dermatitis 03/18/2019   Lumbar degenerative disc disease 11/22/2015   Cervical myelopathy (HCC) 11/22/2015   Osteoarthritis of spine with radiculopathy, lumbar region 11/22/2015   Degenerative disc disease, cervical 11/22/2015   Osteoarthritis of spine with radiculopathy, cervical region 11/22/2015    PCP: Leavy Mole,  PA-C  REFERRING PROVIDER: Corrothers, Duwaine Norris, NP (neurosurgery)  REFERRING DIAG:  s/p cervical spinal fusion, neck pain, cervicalgia  THERAPY DIAG:  Cervicalgia  Radiculopathy, cervical region  Muscle weakness (generalized)  Other muscle spasm  Abnormal posture  Rationale for Evaluation and Treatment: Rehabilitation  ONSET DATE: chronic over many years but underwent C2-6 posterior laminectomy with C2-T2 instrumentation and fusion with Dr. Salina in April 2022   SUBJECTIVE:  PERTINENT HISTORY:  Patient is a 58 y.o. male who presents to outpatient physical therapy with a referral for medical diagnosis  s/p cervical spinal fusion, neck pain, cervicalgia. This patient's chief complaints consist of chronic neck pain and stiffness, UE paresthesia, weakness, and incoordination leading to the following functional deficits: difficulty with usual activities such as using his tractor, being active, personal care, lifting, reading, concentration, work, driving, sleeping, reaching, holding and manipulating things such as a cup and utinsils with hands, eating, shopping, carrying groceries, and recreation. Relevant past medical history and comorbidities include the following: he has Lumbar degenerative disc disease; Cervical myelopathy (HCC); Osteoarthritis of spine with radiculopathy, lumbar region; Hyperlipidemia associated with type 2 diabetes mellitus (HCC); Diabetes mellitus (HCC); Idiopathic chronic gout of multiple sites without tophus; Essential hypertension; Dermatitis; Class 2 severe obesity with serious comorbidity and body mass index (BMI) of 39.0 to 39.9 in adult [facility]); Chronic kidney disease (CKD) stage G2/A3, mildly decreased glomerular filtration rate (GFR) between 60-89 mL/min/1.73 square  meter and albuminuria creatinine ratio greater than 300 mg/g; Proteinuria; Polyp of ascending colon; Rectal polyp; Other chronic pain; Systolic murmur; Tobacco use; S/P cervical spinal fusion; Former heavy tobacco smoker; Gout; Grade I diastolic dysfunction; LVH (left ventricular hypertrophy); Accelerated hypertension; Acute on chronic diastolic congestive heart failure (HCC); Class 2 obesity; Uncontrolled diabetes mellitus with hyperglycemia, with long-term current use of insulin  (HCC); Acute on chronic heart failure with preserved ejection fraction (HFpEF) (HCC); Hypertensive emergency; Acute respiratory failure with hypoxia (HCC); Elevated troponin; Pleuritic chest pain; Type 2 diabetes mellitus with peripheral neuropathy (HCC); Gastroesophageal reflux disease without esophagitis; COPD with asthma (HCC); Dyslipidemia; Unstable angina (HCC); Acute on chronic diastolic heart failure (HCC); Myocardial injury; Type II diabetes mellitus with renal manifestations (HCC); Obesity (BMI 30-39.9); HLD (hyperlipidemia); Chronic diastolic CHF (congestive heart failure) (HCC); Diarrhea; CAD (coronary artery disease); CKD (chronic kidney disease) stage 2, GFR 60-89 ml/min; Degenerative disc disease, cervical; Osteoarthritis of spine with radiculopathy, cervical region; History of colon polyps; COPD exacerbation (HCC); CHF (congestive heart failure) (HCC); Acute pulmonary edema (HCC); Nicotine  dependence; Class 3 obesity; Acute on chronic diastolic (congestive) heart failure (HCC); Chronic kidney disease, stage 3a (HCC); and Stable angina (HCC) on their problem list. he  has a past medical history of Bulging of cervical intervertebral disc, CAD (coronary artery disease), Cervical myelopathy (HCC), Chronic heart failure with preserved ejection fraction (HFpEF) (HCC), CKD (chronic kidney disease), stage III (HCC), COPD (chronic obstructive pulmonary disease) (HCC), Gout, Hyperlipidemia LDL goal <70, Hypertension, Morbid obesity  (HCC), OSA (obstructive sleep apnea), Stress due to family tension (04/15/2019), and Tracheobronchomalacia. he  has a past surgical history that includes Cholecystectomy; Colonoscopy with propofol  (N/A, 03/10/2020); RIGHT/LEFT HEART CATH AND CORONARY ANGIOGRAPHY (N/A, 05/29/2023); CORONARY PRESSURE/FFR STUDY (N/A, 05/29/2023); Cardiac catheterization; Back surgery; Colonoscopy with propofol  (N/A, 09/25/2023); polypectomy (09/25/2023); and RIGHT/LEFT HEART CATH AND CORONARY ANGIOGRAPHY (Bilateral, 10/30/2023). Patient denies hx of cancer, stroke, seizures, unexplained weight loss, unexplained changes in bowel or bladder problems, and osteoporosis  Exercise history: no weights at home.   SUBJECTIVE STATEMENT: Patient has no changes to report. HEP went pretty good.   PAIN: Are you having pain? Yes NPRS: Current: 4-5/10 Stiffness over base of neck    From initial eval 05/19/24:  Best: 2/10, Worst: 10/10. Pain location: back of neck down to just past the scapulae, bilateral upper traps to tops of shoulders, sore over the clavicles and throat to touch. Numbness and tingling bilateral hands (R > L). Numbness in the R hand 2nd and  3rd digits all the time, but can be all the fingers of the R hand. He gets shooting down the left arm to the middle finger and increased numbness spreading from that finger.  Pain description: I can feel where they cut me, muscle spasm (vibrating), choking, cramp Aggravating factors: bending, lifting, raking, driving for a long distance Relieving factors: stay active, getting out to do things he usually does   PRECAUTIONS: Other: lifting heavy stuff and cannot remember other stuff, does not want him to over do stuff, cut back on some of the stuff he is doing at the house. He mentions bed rest but also that the doctor told him to come to physical therapy. Repeatedly hospitalized for complications with his heart (most recently afib)   PATIENT GOALS: like get everything loosened up  like it was when he left PT last time, and try to get the pain and tightness to quit.   NEXT MD VISIT: MRI on 05/23/2024 followed by a phone call after the results are in.  OBJECTIVE  Vitals:   05/21/24 0958  BP: (!) 156/85  Pulse: 70  SpO2: 97%   SELF-REPORTED FUNCTION Patient Specific Functional Scale (PSFS)  Lifting stuff: 3/10 Climbing Stairs: 4/10 Holding things without dropping them: 3/10 Average: 3.3/10   TREATMENT                                                                                                                            Neuromuscular Re-education: a technique or exercise performed with the goal of improving the level of communication between the body and the brain, such as for balance, motor control, muscle activation patterns, coordination, desensitization, quality of muscle contraction, proprioception, and/or kinesthetic sense needed for successful and safe completion of functional activities.   Seated lat pull for lower trap activation and to decrease neural drive of UT while improving grip strength 3x15 with 25# cable Assistance with set up and putting bar back Cuing for motor control/technique  Sidelying L shoulder ER with scapular retraction to improve activation of intrascapular muscles and decrease activity of upper trap 1x10 with 5 second holds  Cuing to improve effectiveness of exercise  Therapeutic exercise: therapeutic exercises that incorporate ONE parameter at one or more areas of the body to centralize symptoms, develop strength and endurance, range of motion, and flexibility required for successful completion of functional activities.  Vitals check prior to exercise for safety (see above)  Sidelying modified open book with hand on chest and both knees up 1x10 each side with exhale  To improve thoracic mobility  Education on HEP including handout   Pt required multimodal cuing for proper technique and to facilitate improved neuromuscular  control, strength, range of motion, and functional ability resulting in improved performance and form.    PATIENT EDUCATION:  Education details: Exercise purpose/form. Self management techniques. Education on diagnosis, prognosis, POC, anatomy and physiology of current condition. Education on HEP  Person educated: Patient Education method: Explanation,  Demonstration, Tactile cues, and Verbal cues Education comprehension: verbalized understanding, returned demonstration, and needs further education  HOME EXERCISE PROGRAM: Access Code: [email or web address] URL: [email or web address] Date: 05/21/2024 Prepared by: Camie Cleverly  Exercises - Sidelying Shoulder External Rotation  - 1-2 x daily - 2 sets - 10 reps - 5 seconds hold - Sidelying Open Book  - 1-2 x daily - 2 sets - 10 reps - 1 or more breaths hold - Lat Pull with band/cable  - 1-2 x daily - 2 sets - 10 reps - 5 seconds hold  ASSESSMENT:  CLINICAL IMPRESSION: Patient is here for his first treatment since initial evaluation. Initiated exercises to improve thoracic mobility and lower scapular strength while decreasing UT activation. Patient tolerated session well and appeared to have a good understanding of initial HEP. Patient would benefit from continued management of limiting condition by skilled physical therapist to address remaining impairments and functional limitations to work towards stated goals and return to PLOF or maximal functional independence.    From initial PT evaluation on 05/19/24:  Patient is a 58 y.o. male referred to outpatient physical therapy with a medical diagnosis of  s/p cervical spinal fusion, neck pain, cervicalgia who presents with signs and symptoms consistent with chronic neck pain, postural changes, bilateral radiculopathy with load sensitivity and likely nerve tension sensitivity. Traction alleviation test positive for load sensitivity. Patient has a lot of stiffness in muscles and joints in the regions  surrounding his C2-T2 fusion that appear to be leading to a lot of his discomfort and functional difficulty. Patient presents with significant pain, joint stiffness, posture, nerve stretch sensitivity, load sensitivity, ROM, muscle performance (strength/power/endurance), paresthesia, motor control, knowledge, and activity tolerance impairments that are limiting ability to complete difficulty with usual activities such as using his tractor, being active, personal care, lifting, reading, concentration, work, driving, sleeping, reaching, holding and manipulating things such as a cup and utinsils with hands, eating, shopping, carrying groceries, and recreation without difficulty. Patient will benefit from skilled physical therapy intervention to address current body structure impairments and activity limitations to improve function and work towards goals set in current POC in order to return to prior level of function or maximal functional improvement.    OBJECTIVE IMPAIRMENTS: cardiopulmonary status limiting activity, decreased activity tolerance, decreased coordination, decreased endurance, decreased knowledge of condition, decreased ROM, decreased strength, hypomobility, increased edema, increased fascial restrictions, increased muscle spasms, impaired flexibility, impaired sensation, impaired UE functional use, improper body mechanics, postural dysfunction, obesity, and pain.   ACTIVITY LIMITATIONS: carrying, lifting, bending, sitting, bed mobility, reach over head, hygiene/grooming, and caring for others and   PARTICIPATION LIMITATIONS: meal prep, cleaning, laundry, driving, shopping, community activity, occupation, yard work, and  difficulty with usual activities such as using his tractor, being active, personal care, lifting, reading, concentration, work, driving, sleeping, reaching, holding and manipulating things such as a cup and utinsils with hands, eating, shopping, carrying groceries, and  recreation  PERSONAL FACTORS: Behavior pattern, Fitness, Past/current experiences, Time since onset of injury/illness/exacerbation, and 3+ comorbidities:  Cervical myelopathy (HCC); Osteoarthritis of spine with radiculopathy, lumbar region; Hyperlipidemia associated with type 2 diabetes mellitus (HCC); Diabetes mellitus (HCC); Idiopathic chronic gout of multiple sites without tophus; Essential hypertension; Dermatitis; Class 2 severe obesity with serious comorbidity and body mass index (BMI) of 39.0 to 39.9 in adult [facility]); Chronic kidney disease (CKD) stage G2/A3, mildly decreased glomerular filtration rate (GFR) between 60-89 mL/min/1.73 square meter and albuminuria creatinine ratio greater than 300 mg/g; Proteinuria; Polyp of ascending colon;  Rectal polyp; Other chronic pain; Systolic murmur; Tobacco use; S/P cervical spinal fusion; Former heavy tobacco smoker; Gout; Grade I diastolic dysfunction; LVH (left ventricular hypertrophy); Accelerated hypertension; Acute on chronic diastolic congestive heart failure (HCC); Class 2 obesity; Uncontrolled diabetes mellitus with hyperglycemia, with long-term current use of insulin  (HCC); Acute on chronic heart failure with preserved ejection fraction (HFpEF) (HCC); Hypertensive emergency; Acute respiratory failure with hypoxia (HCC); Elevated troponin; Pleuritic chest pain; Type 2 diabetes mellitus with peripheral neuropathy (HCC); Gastroesophageal reflux disease without esophagitis; COPD with asthma (HCC); Dyslipidemia; Unstable angina (HCC); Acute on chronic diastolic heart failure (HCC); Myocardial injury; Type II diabetes mellitus with renal manifestations (HCC); Obesity (BMI 30-39.9); HLD (hyperlipidemia); Chronic diastolic CHF (congestive heart failure) (HCC); Diarrhea; CAD (coronary artery disease); CKD (chronic kidney disease) stage 2, GFR 60-89 ml/min; Degenerative disc disease, cervical; Osteoarthritis of spine with radiculopathy, cervical region; History of  colon polyps; COPD exacerbation (HCC); CHF (congestive heart failure) (HCC); Acute pulmonary edema (HCC); Nicotine  dependence; Class 3 obesity; Acute on chronic diastolic (congestive) heart failure (HCC); Chronic kidney disease, stage 3a (HCC); and Stable angina (HCC) on their problem list. he  has a past medical history of Bulging of cervical intervertebral disc, CAD (coronary artery disease), Cervical myelopathy (HCC), Chronic heart failure with preserved ejection fraction (HFpEF) (HCC), CKD (chronic kidney disease), stage III (HCC), COPD (chronic obstructive pulmonary disease) (HCC), Gout, Hyperlipidemia LDL goal <70, Hypertension, Morbid obesity (HCC), OSA (obstructive sleep apnea), Stress due to family tension (04/15/2019), and Tracheobronchomalacia. he  has a past surgical history that includes Cholecystectomy; Colonoscopy with propofol  (N/A, 03/10/2020); RIGHT/LEFT HEART CATH AND CORONARY ANGIOGRAPHY (N/A, 05/29/2023); CORONARY PRESSURE/FFR STUDY (N/A, 05/29/2023); Cardiac catheterization; Back surgery; Colonoscopy with propofol  (N/A, 09/25/2023); polypectomy (09/25/2023); and RIGHT/LEFT HEART CATH AND CORONARY ANGIOGRAPHY (Bilateral, 10/30/2023) are also affecting patient's functional outcome.   REHAB POTENTIAL: Fair due to severity and complexity of condition and past experiences. Good rehab potential to improve to where he was at last PT discharge.   CLINICAL DECISION MAKING: Evolving/moderate complexity  EVALUATION COMPLEXITY: Moderate   GOALS: Goals reviewed with patient? No  SHORT TERM GOALS: Target date: 06/02/2024  Patient will be independent with initial home exercise program for self-management of symptoms. Baseline: Initial HEP provided at IE (05/19/24); Goal status: MET   LONG TERM GOALS: Target date: 08/11/2024  Patient will be independent with a long-term home exercise program for self-management of symptoms.  Baseline: Initial HEP provided at IE (05/19/24); Goal status: In  progress  2.  Patient will demonstrate improved Neck Disability Index (NDI) to equal or less than 10% to demonstrate improvement in overall condition and self-reported functional ability.  Baseline: 58% (05/19/24); Goal status: In progress  3.  Patient will demonstrate increased UE myotomes by equal or greater than 1/2 MMT or up to 4+/5 to improve his ability to use his B UE for riding the tractor and completing household activities.  Baseline: as low as 3/5  (05/19/24); Goal status: In Progress  4.  Patient will demonstrate improved cervical spine rotation to 40 degrees bilaterally to maximize his use of the AA ROM and view his surroundings more easily.  Baseline: R/L 15/34 (05/19/24); Goal status: In progress  5.  Patient will demonstrate improvement in Patient Specific Functional Scale (PSFS) of equal or greater than 8/10 points to reflect clinically significant improvement in patient's most valued functional activities. Baseline: to be measured at visit 2 as appropriate (05/19/24); 3.3/10 (05/21/2024);  Goal status: In progress    PLAN:  PT  FREQUENCY: 2x/week  PT DURATION: 8-12 weeks  PLANNED INTERVENTIONS: 02835- PT Re-evaluation, 97750- Physical Performance Testing, 97110-Therapeutic exercises, 97530- Therapeutic activity, 97112- Neuromuscular re-education, 97535- Self Care, 02859- Manual therapy, [phone]- Aquatic Therapy, [phone]- Electrical stimulation (unattended), [phone] (1-2 muscles), 20561 (3+ muscles)- Dry Needling, Patient/Family education, Joint mobilization, Spinal mobilization, Cryotherapy, and Moist heat  PLAN FOR NEXT SESSION: update HEP as appropriate, educate on mechanical stressors and go through day to help with education on alleviation of these for the short term, exercise to improve intrascapular activation and strength for more balanced force couple and decrease over-reliance on UT, interventions to improve thoracic and AA/OA mobility. education   Camie SAUNDERS. Juli, PT,  DPT, Cert. MDT 05/21/24, 6:45 PM  [facility] [address] [place], [place] [address] P: [phone] I F: [phone]

## 2024-05-23 DIAGNOSIS — Z981 Arthrodesis status: Secondary | ICD-10-CM | POA: Diagnosis not present

## 2024-05-23 DIAGNOSIS — M542 Cervicalgia: Secondary | ICD-10-CM | POA: Diagnosis not present

## 2024-05-25 ENCOUNTER — Ambulatory Visit: Admitting: Student in an Organized Health Care Education/Training Program

## 2024-05-26 ENCOUNTER — Ambulatory Visit: Admitting: "Endocrinology

## 2024-05-26 DIAGNOSIS — Z981 Arthrodesis status: Secondary | ICD-10-CM | POA: Diagnosis not present

## 2024-05-26 DIAGNOSIS — M79602 Pain in left arm: Secondary | ICD-10-CM | POA: Diagnosis not present

## 2024-05-26 DIAGNOSIS — M542 Cervicalgia: Secondary | ICD-10-CM | POA: Diagnosis not present

## 2024-05-26 DIAGNOSIS — E1142 Type 2 diabetes mellitus with diabetic polyneuropathy: Secondary | ICD-10-CM

## 2024-05-28 ENCOUNTER — Ambulatory Visit: Admitting: Physical Therapy

## 2024-05-28 ENCOUNTER — Encounter: Payer: Self-pay | Admitting: Family Medicine

## 2024-05-28 ENCOUNTER — Other Ambulatory Visit: Payer: Self-pay

## 2024-05-28 ENCOUNTER — Encounter: Payer: Self-pay | Admitting: Physical Therapy

## 2024-05-28 DIAGNOSIS — M6281 Muscle weakness (generalized): Secondary | ICD-10-CM | POA: Diagnosis not present

## 2024-05-28 DIAGNOSIS — M5412 Radiculopathy, cervical region: Secondary | ICD-10-CM | POA: Diagnosis not present

## 2024-05-28 DIAGNOSIS — M62838 Other muscle spasm: Secondary | ICD-10-CM

## 2024-05-28 DIAGNOSIS — M542 Cervicalgia: Secondary | ICD-10-CM | POA: Diagnosis not present

## 2024-05-28 DIAGNOSIS — R293 Abnormal posture: Secondary | ICD-10-CM

## 2024-05-28 NOTE — Therapy (Signed)
 OUTPATIENT PHYSICAL THERAPY TREATMENT   Patient Name: Cory Rivas MRN: [id number] DOB:Jan 05, 1966, 58 y.o., male Today's Date: 05/28/2024  END OF SESSION:  PT End of Session - 05/28/24 1137     Visit Number 3    Number of Visits 17    Date for PT Re-Evaluation 08/11/24    Authorization Type [place] MEDICAID HE[employer]Y BLUE reporting period from 05/19/2024    Authorization Time Period HB auth#[id number] for 13 PT vsts from 8/7-11/5    Authorization - Visit Number 2    Authorization - Number of Visits 13    Progress Note Due on Visit 10    PT Start Time 1122    PT Stop Time 1203    PT Time Calculation (min) 41 min    Activity Tolerance Patient tolerated treatment well    Behavior During Therapy [facility] for tasks assessed/performed            Past Medical History:  Diagnosis Date   Bulging of cervical intervertebral disc    CAD (coronary artery disease)    a. 09/2020 MV: low risk; b. 05/2023 Cath: Moderate LAD dzs w/ seq 50-60% stenoses and nl RFR 0.93. Mod OM dzs->Med rx; b. 10/2023 Cath: LM min irregs, LAD 55ost, 55p/m, LCX large nl, OM1 50, RCA mod, nl.   Cervical myelopathy (HCC)    Chronic heart failure with preserved ejection fraction (HFpEF) (HCC)    a. 05/2023 Echo: EF 50-55%, no rwma, GrIII DD, nl RV fxn, mild MR, Ao sclerosis; b. 10/2023 Echo: EF 55-60%, no rwma, nl RV fxn, mild MR, Ao sclerosis.   CKD (chronic kidney disease), stage III (HCC)    COPD (chronic obstructive pulmonary disease) (HCC)    Diabetes mellitus without complication (HCC)    Gout    Hyperlipidemia LDL goal <70    Hypertension    Morbid obesity (HCC)    OSA (obstructive sleep apnea)    Stress due to family tension 04/15/2019   Tracheobronchomalacia    Past Surgical History:  Procedure Laterality Date   BACK SURGERY     CARDIAC CATHETERIZATION     CHOLECYSTECTOMY     COLONOSCOPY WITH PROPOFOL  N/A 03/10/2020   Procedure: COLONOSCOPY WITH PROPOFOL ;  Surgeon: Jinny Carmine, MD;  Location: [facility] ENDOSCOPY;   Service: Endoscopy;  Laterality: N/A;   COLONOSCOPY WITH PROPOFOL  N/A 09/25/2023   Procedure: COLONOSCOPY WITH PROPOFOL ;  Surgeon: Jinny Carmine, MD;  Location: [facility] ENDOSCOPY;  Service: Endoscopy;  Laterality: N/A;   CORONARY PRESSURE/FFR STUDY N/A 05/29/2023   Procedure: CORONARY PRESSURE/FFR STUDY;  Surgeon: Mady Bruckner, MD;  Location: [facility] INVASIVE CV [facility];  Service: Cardiovascular;  Laterality: N/A;   POLYPECTOMY  09/25/2023   Procedure: POLYPECTOMY;  Surgeon: Jinny Carmine, MD;  Location: [facility] ENDOSCOPY;  Service: Endoscopy;;   RIGHT/LEFT HEART CATH AND CORONARY ANGIOGRAPHY N/A 05/29/2023   Procedure: RIGHT/LEFT HEART CATH AND CORONARY ANGIOGRAPHY;  Surgeon: Mady Bruckner, MD;  Location: [facility];  Service: Cardiovascular;  Laterality: N/A;   RIGHT/LEFT HEART CATH AND CORONARY ANGIOGRAPHY Bilateral 10/30/2023   Procedure: RIGHT/LEFT HEART CATH AND CORONARY ANGIOGRAPHY;  Surgeon: Mady Bruckner, MD;  Location: [facility] CV [facility];  Service: Cardiovascular;  Laterality: Bilateral;   Patient Active Problem List   Diagnosis Date Noted   Chronic kidney disease, stage 3a (HCC) 05/17/2024   Stable angina (HCC) 05/17/2024   Acute on chronic diastolic (congestive) heart failure (HCC) 04/03/2024   Class 3 obesity 02/18/2024   Nicotine  dependence 01/23/2024   Acute pulmonary edema (HCC)  01/22/2024   CHF (congestive heart failure) (HCC) 01/20/2024   COPD exacerbation (HCC) 10/21/2023   History of colon polyps 09/25/2023   Myocardial injury 08/19/2023   Type II diabetes mellitus with renal manifestations (HCC) 08/19/2023   Obesity (BMI 30-39.9) 08/19/2023   HLD (hyperlipidemia) 08/19/2023   Chronic diastolic CHF (congestive heart failure) (HCC) 08/19/2023   Diarrhea 08/19/2023   CAD (coronary artery disease) 08/19/2023   CKD (chronic kidney disease) stage 2, GFR 60-89 ml/min 08/19/2023   Acute on chronic diastolic heart failure (HCC) 05/30/2023   Unstable angina (HCC)  05/29/2023   Pleuritic chest pain 05/27/2023   Type 2 diabetes mellitus with peripheral neuropathy (HCC) 05/27/2023   Gastroesophageal reflux disease without esophagitis 05/27/2023   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   Dyslipidemia 05/27/2023   Hypertensive emergency 11/22/2022   Acute respiratory failure with hypoxia (HCC) 11/22/2022   Elevated troponin 11/22/2022   Acute on chronic heart failure with preserved ejection fraction (HFpEF) (HCC) 08/24/2022   Acute on chronic diastolic congestive heart failure (HCC) 08/23/2022   Class 2 obesity 08/23/2022   Uncontrolled diabetes mellitus with hyperglycemia, with long-term current use of insulin  (HCC) 08/23/2022   Grade I diastolic dysfunction 06/08/2022   LVH (left ventricular hypertrophy) 06/08/2022   Accelerated hypertension 06/08/2022   S/P cervical spinal fusion 08/18/2021   Former heavy tobacco smoker 08/18/2021   Gout 08/18/2021   Other chronic pain 10/26/2020   Systolic murmur 10/26/2020   Tobacco use 10/26/2020   Polyp of ascending colon    Rectal polyp    Class 2 severe obesity with serious comorbidity and body mass index (BMI) of 39.0 to 39.9 in adult [facility]) 04/15/2019   Chronic kidney disease (CKD) stage G2/A3, mildly decreased glomerular filtration rate (GFR) between 60-89 mL/min/1.73 square meter and albuminuria creatinine ratio greater than 300 mg/g 04/15/2019   Proteinuria 04/15/2019   Hyperlipidemia associated with type 2 diabetes mellitus (HCC) 03/18/2019   Diabetes mellitus (HCC) 03/18/2019   Idiopathic chronic gout of multiple sites without tophus 03/18/2019   Essential hypertension 03/18/2019   Dermatitis 03/18/2019   Lumbar degenerative disc disease 11/22/2015   Cervical myelopathy (HCC) 11/22/2015   Osteoarthritis of spine with radiculopathy, lumbar region 11/22/2015   Degenerative disc disease, cervical 11/22/2015   Osteoarthritis of spine with radiculopathy, cervical region 11/22/2015    PCP: Leavy Mole,  PA-C  REFERRING PROVIDER: Corrothers, Duwaine Norris, NP (neurosurgery)  REFERRING DIAG:  s/p cervical spinal fusion, neck pain, cervicalgia  THERAPY DIAG:  Cervicalgia  Radiculopathy, cervical region  Muscle weakness (generalized)  Other muscle spasm  Abnormal posture  Rationale for Evaluation and Treatment: Rehabilitation  ONSET DATE: chronic over many years but underwent C2-6 posterior laminectomy with C2-T2 instrumentation and fusion with Dr. Salina in April 2022   SUBJECTIVE:  PERTINENT HISTORY:  Patient is a 58 y.o. male who presents to outpatient physical therapy with a referral for medical diagnosis  s/p cervical spinal fusion, neck pain, cervicalgia. This patient's chief complaints consist of chronic neck pain and stiffness, UE paresthesia, weakness, and incoordination leading to the following functional deficits: difficulty with usual activities such as using his tractor, being active, personal care, lifting, reading, concentration, work, driving, sleeping, reaching, holding and manipulating things such as a cup and utinsils with hands, eating, shopping, carrying groceries, and recreation. Relevant past medical history and comorbidities include the following: he has Lumbar degenerative disc disease; Cervical myelopathy (HCC); Osteoarthritis of spine with radiculopathy, lumbar region; Hyperlipidemia associated with type 2 diabetes mellitus (HCC); Diabetes mellitus (HCC); Idiopathic chronic gout of multiple sites without tophus; Essential hypertension; Dermatitis; Class 2 severe obesity with serious comorbidity and body mass index (BMI) of 39.0 to 39.9 in adult [facility]); Chronic kidney disease (CKD) stage G2/A3, mildly decreased glomerular filtration rate (GFR) between 60-89 mL/min/1.73 square  meter and albuminuria creatinine ratio greater than 300 mg/g; Proteinuria; Polyp of ascending colon; Rectal polyp; Other chronic pain; Systolic murmur; Tobacco use; S/P cervical spinal fusion; Former heavy tobacco smoker; Gout; Grade I diastolic dysfunction; LVH (left ventricular hypertrophy); Accelerated hypertension; Acute on chronic diastolic congestive heart failure (HCC); Class 2 obesity; Uncontrolled diabetes mellitus with hyperglycemia, with long-term current use of insulin  (HCC); Acute on chronic heart failure with preserved ejection fraction (HFpEF) (HCC); Hypertensive emergency; Acute respiratory failure with hypoxia (HCC); Elevated troponin; Pleuritic chest pain; Type 2 diabetes mellitus with peripheral neuropathy (HCC); Gastroesophageal reflux disease without esophagitis; COPD with asthma (HCC); Dyslipidemia; Unstable angina (HCC); Acute on chronic diastolic heart failure (HCC); Myocardial injury; Type II diabetes mellitus with renal manifestations (HCC); Obesity (BMI 30-39.9); HLD (hyperlipidemia); Chronic diastolic CHF (congestive heart failure) (HCC); Diarrhea; CAD (coronary artery disease); CKD (chronic kidney disease) stage 2, GFR 60-89 ml/min; Degenerative disc disease, cervical; Osteoarthritis of spine with radiculopathy, cervical region; History of colon polyps; COPD exacerbation (HCC); CHF (congestive heart failure) (HCC); Acute pulmonary edema (HCC); Nicotine  dependence; Class 3 obesity; Acute on chronic diastolic (congestive) heart failure (HCC); Chronic kidney disease, stage 3a (HCC); and Stable angina (HCC) on their problem list. he  has a past medical history of Bulging of cervical intervertebral disc, CAD (coronary artery disease), Cervical myelopathy (HCC), Chronic heart failure with preserved ejection fraction (HFpEF) (HCC), CKD (chronic kidney disease), stage III (HCC), COPD (chronic obstructive pulmonary disease) (HCC), Gout, Hyperlipidemia LDL goal <70, Hypertension, Morbid obesity  (HCC), OSA (obstructive sleep apnea), Stress due to family tension (04/15/2019), and Tracheobronchomalacia. he  has a past surgical history that includes Cholecystectomy; Colonoscopy with propofol  (N/A, 03/10/2020); RIGHT/LEFT HEART CATH AND CORONARY ANGIOGRAPHY (N/A, 05/29/2023); CORONARY PRESSURE/FFR STUDY (N/A, 05/29/2023); Cardiac catheterization; Back surgery; Colonoscopy with propofol  (N/A, 09/25/2023); polypectomy (09/25/2023); and RIGHT/LEFT HEART CATH AND CORONARY ANGIOGRAPHY (Bilateral, 10/30/2023). Patient denies hx of cancer, stroke, seizures, unexplained weight loss, unexplained changes in bowel or bladder problems, and osteoporosis  Exercise history: no weights at home.   SUBJECTIVE STATEMENT: Patient states his doctors wants him to get a nerve conduction test and a CT scan now. He states he was sore over his upper trap region after last PT session and he cannot get the ER exercise done correctly. He has no pain currently, just some stiffness. He states his doctor asked him about dry needling and reccommended he ask PT about doing dry needling.   PAIN: Are you having pain? NPRS: Current: no pain, stiffness in  neck and low back  From initial eval 05/19/24:  Best: 2/10, Worst: 10/10. Pain location: back of neck down to just past the scapulae, bilateral upper traps to tops of shoulders, sore over the clavicles and throat to touch. Numbness and tingling bilateral hands (R > L). Numbness in the R hand 2nd and 3rd digits all the time, but can be all the fingers of the R hand. He gets shooting down the left arm to the middle finger and increased numbness spreading from that finger.  Pain description: I can feel where they cut me, muscle spasm (vibrating), choking, cramp Aggravating factors: bending, lifting, raking, driving for a long distance Relieving factors: stay active, getting out to do things he usually does   PRECAUTIONS: Other: lifting heavy stuff and cannot remember other stuff, does  not want him to over do stuff, cut back on some of the stuff he is doing at the house. He mentions bed rest but also that the doctor told him to come to physical therapy. Repeatedly hospitalized for complications with his heart (most recently afib)   PATIENT GOALS: like get everything loosened up like it was when he left PT last time, and try to get the pain and tightness to quit.   NEXT MD VISIT: MRI on 05/23/2024 followed by a phone call after the results are in.  OBJECTIVE  NEUROLOGIC ULNT Median: positive on left, worse on right with pain in mid back sensitive to hand position.  Ulnar: negative on left, positive on right Radial: positive on left but unclear how far into ROM, more positive on R with wrist position affecting pain at UT even in 30 degrees shoulder flexion.   TREATMENT                                                                                                                            Neuromuscular Re-education: a technique or exercise performed with the goal of improving the level of communication between the body and the brain, such as for balance, motor control, muscle activation patterns, coordination, desensitization, quality of muscle contraction, proprioception, and/or kinesthetic sense needed for successful and safe completion of functional activities.   Seated lat pull for lower trap activation and to decrease neural drive of UT while improving grip strength 3x15 with 25# cable Assistance with set up and putting bar back Cuing for motor control/technique  Sidelying single arm shoulder ER with scapular retraction to improve activation of intrascapular muscles and decrease activity of upper trap R 1x15 with 5 second holds  L 1x15 with 5 second holds (added 3#DB) Cuing to improve effectiveness of exercise  ULNT to inform health of nervous system in relation ship to nerve stretch (see above)  Therapeutic exercise: therapeutic exercises that incorporate ONE  parameter at one or more areas of the body to centralize symptoms, develop strength and endurance, range of motion, and flexibility required for successful completion of functional activities.  Quadruped thoracic rotation with left  hand behind back 1x5 with 2-5 second progressing to with breath (exhale on rotation) Discontinued due to L knee pain  Sidelying modified open book with hand on chest and both knees up 1x10 each side with exhale  To improve thoracic mobility Had to stop for a minute on the right due to acute vertigo (better when he slowed motion)  Education on HEP including handout (reprint since he lost his paper - sister threw it away when cleaning the kitchen)  Pt required multimodal cuing for proper technique and to facilitate improved neuromuscular control, strength, range of motion, and functional ability resulting in improved performance and form.    PATIENT EDUCATION:  Education details: Exercise purpose/form. Self management techniques. Education on diagnosis, prognosis, POC, anatomy and physiology of current condition. Education on HEP  Person educated: Patient Education method: Explanation, Demonstration, Tactile cues, and Verbal cues Education comprehension: verbalized understanding, returned demonstration, and needs further education  HOME EXERCISE PROGRAM: Access Code: [email or web address] URL: [email or web address] Date: 05/21/2024 Prepared by: Camie Cleverly  Exercises - Sidelying Shoulder External Rotation  - 1-2 x daily - 2 sets - 10 reps - 5 seconds hold - Sidelying Open Book  - 1-2 x daily - 2 sets - 10 reps - 1 or more breaths hold - Lat Pull with band/cable  - 1-2 x daily - 2 sets - 10 reps - 5 seconds hold  ASSESSMENT:  CLINICAL IMPRESSION: Patient educated about dry needling including the self-pay price due to insurance not covering it. He was unable to proceed with dry needling due to financial limitations.  Continued working on intrascapular  strengthening and motor control as well as thoracic spine mobility while reinforcing HEP. Patient was limited in attempts to progress due to pain in his left knee. He also had some vertigo symptoms with sidelying thoracic rotation that resolved when he moved slower. Patient demonstrates neural tension sensitivity on the right for radial (most), ulnar, and median and ulnar and radial on the left (but not as severe as right). Patient's HEP as reprinted for him and reviewed. Plan to continue with interventions for thoracic mobility and intrascapular strengthening next session. Patient would benefit from continued management of limiting condition by skilled physical therapist to address remaining impairments and functional limitations to work towards stated goals and return to PLOF or maximal functional independence.     From initial PT evaluation on 05/19/24:  Patient is a 58 y.o. male referred to outpatient physical therapy with a medical diagnosis of  s/p cervical spinal fusion, neck pain, cervicalgia who presents with signs and symptoms consistent with chronic neck pain, postural changes, bilateral radiculopathy with load sensitivity and likely nerve tension sensitivity. Traction alleviation test positive for load sensitivity. Patient has a lot of stiffness in muscles and joints in the regions surrounding his C2-T2 fusion that appear to be leading to a lot of his discomfort and functional difficulty. Patient presents with significant pain, joint stiffness, posture, nerve stretch sensitivity, load sensitivity, ROM, muscle performance (strength/power/endurance), paresthesia, motor control, knowledge, and activity tolerance impairments that are limiting ability to complete difficulty with usual activities such as using his tractor, being active, personal care, lifting, reading, concentration, work, driving, sleeping, reaching, holding and manipulating things such as a cup and utinsils with hands, eating, shopping,  carrying groceries, and recreation without difficulty. Patient will benefit from skilled physical therapy intervention to address current body structure impairments and activity limitations to improve function and work towards goals set in current POC in order to return to  prior level of function or maximal functional improvement.    OBJECTIVE IMPAIRMENTS: cardiopulmonary status limiting activity, decreased activity tolerance, decreased coordination, decreased endurance, decreased knowledge of condition, decreased ROM, decreased strength, hypomobility, increased edema, increased fascial restrictions, increased muscle spasms, impaired flexibility, impaired sensation, impaired UE functional use, improper body mechanics, postural dysfunction, obesity, and pain.   ACTIVITY LIMITATIONS: carrying, lifting, bending, sitting, bed mobility, reach over head, hygiene/grooming, and caring for others and   PARTICIPATION LIMITATIONS: meal prep, cleaning, laundry, driving, shopping, community activity, occupation, yard work, and  difficulty with usual activities such as using his tractor, being active, personal care, lifting, reading, concentration, work, driving, sleeping, reaching, holding and manipulating things such as a cup and utinsils with hands, eating, shopping, carrying groceries, and recreation  PERSONAL FACTORS: Behavior pattern, Fitness, Past/current experiences, Time since onset of injury/illness/exacerbation, and 3+ comorbidities:  Cervical myelopathy (HCC); Osteoarthritis of spine with radiculopathy, lumbar region; Hyperlipidemia associated with type 2 diabetes mellitus (HCC); Diabetes mellitus (HCC); Idiopathic chronic gout of multiple sites without tophus; Essential hypertension; Dermatitis; Class 2 severe obesity with serious comorbidity and body mass index (BMI) of 39.0 to 39.9 in adult [facility]); Chronic kidney disease (CKD) stage G2/A3, mildly decreased glomerular filtration rate (GFR) between 60-89  mL/min/1.73 square meter and albuminuria creatinine ratio greater than 300 mg/g; Proteinuria; Polyp of ascending colon; Rectal polyp; Other chronic pain; Systolic murmur; Tobacco use; S/P cervical spinal fusion; Former heavy tobacco smoker; Gout; Grade I diastolic dysfunction; LVH (left ventricular hypertrophy); Accelerated hypertension; Acute on chronic diastolic congestive heart failure (HCC); Class 2 obesity; Uncontrolled diabetes mellitus with hyperglycemia, with long-term current use of insulin  (HCC); Acute on chronic heart failure with preserved ejection fraction (HFpEF) (HCC); Hypertensive emergency; Acute respiratory failure with hypoxia (HCC); Elevated troponin; Pleuritic chest pain; Type 2 diabetes mellitus with peripheral neuropathy (HCC); Gastroesophageal reflux disease without esophagitis; COPD with asthma (HCC); Dyslipidemia; Unstable angina (HCC); Acute on chronic diastolic heart failure (HCC); Myocardial injury; Type II diabetes mellitus with renal manifestations (HCC); Obesity (BMI 30-39.9); HLD (hyperlipidemia); Chronic diastolic CHF (congestive heart failure) (HCC); Diarrhea; CAD (coronary artery disease); CKD (chronic kidney disease) stage 2, GFR 60-89 ml/min; Degenerative disc disease, cervical; Osteoarthritis of spine with radiculopathy, cervical region; History of colon polyps; COPD exacerbation (HCC); CHF (congestive heart failure) (HCC); Acute pulmonary edema (HCC); Nicotine  dependence; Class 3 obesity; Acute on chronic diastolic (congestive) heart failure (HCC); Chronic kidney disease, stage 3a (HCC); and Stable angina (HCC) on their problem list. he  has a past medical history of Bulging of cervical intervertebral disc, CAD (coronary artery disease), Cervical myelopathy (HCC), Chronic heart failure with preserved ejection fraction (HFpEF) (HCC), CKD (chronic kidney disease), stage III (HCC), COPD (chronic obstructive pulmonary disease) (HCC), Gout, Hyperlipidemia LDL goal <70,  Hypertension, Morbid obesity (HCC), OSA (obstructive sleep apnea), Stress due to family tension (04/15/2019), and Tracheobronchomalacia. he  has a past surgical history that includes Cholecystectomy; Colonoscopy with propofol  (N/A, 03/10/2020); RIGHT/LEFT HEART CATH AND CORONARY ANGIOGRAPHY (N/A, 05/29/2023); CORONARY PRESSURE/FFR STUDY (N/A, 05/29/2023); Cardiac catheterization; Back surgery; Colonoscopy with propofol  (N/A, 09/25/2023); polypectomy (09/25/2023); and RIGHT/LEFT HEART CATH AND CORONARY ANGIOGRAPHY (Bilateral, 10/30/2023) are also affecting patient's functional outcome.   REHAB POTENTIAL: Fair due to severity and complexity of condition and past experiences. Good rehab potential to improve to where he was at last PT discharge.   CLINICAL DECISION MAKING: Evolving/moderate complexity  EVALUATION COMPLEXITY: Moderate   GOALS: Goals reviewed with patient? No  SHORT TERM GOALS: Target date: 06/02/2024  Patient will be independent  with initial home exercise program for self-management of symptoms. Baseline: Initial HEP provided at IE (05/19/24); Goal status: MET   LONG TERM GOALS: Target date: 08/11/2024  Patient will be independent with a long-term home exercise program for self-management of symptoms.  Baseline: Initial HEP provided at IE (05/19/24); Goal status: In progress  2.  Patient will demonstrate improved Neck Disability Index (NDI) to equal or less than 10% to demonstrate improvement in overall condition and self-reported functional ability.  Baseline: 58% (05/19/24); Goal status: In progress  3.  Patient will demonstrate increased UE myotomes by equal or greater than 1/2 MMT or up to 4+/5 to improve his ability to use his B UE for riding the tractor and completing household activities.  Baseline: as low as 3/5  (05/19/24); Goal status: In Progress  4.  Patient will demonstrate improved cervical spine rotation to 40 degrees bilaterally to maximize his use of the AA  ROM and view his surroundings more easily.  Baseline: R/L 15/34 (05/19/24); Goal status: In progress  5.  Patient will demonstrate improvement in Patient Specific Functional Scale (PSFS) of equal or greater than 8/10 points to reflect clinically significant improvement in patient's most valued functional activities. Baseline: to be measured at visit 2 as appropriate (05/19/24); 3.3/10 (05/21/2024);  Goal status: In progress    PLAN:  PT FREQUENCY: 2x/week  PT DURATION: 8-12 weeks  PLANNED INTERVENTIONS: 97164- PT Re-evaluation, 97750- Physical Performance Testing, 97110-Therapeutic exercises, 97530- Therapeutic activity, 97112- Neuromuscular re-education, 97535- Self Care, [id number]- Manual therapy, [phone]- Aquatic Therapy, [phone]- Electrical stimulation (unattended), [phone] (1-2 muscles), [id number] (3+ muscles)- Dry Needling, Patient/Family education, Joint mobilization, Spinal mobilization, Cryotherapy, and Moist heat  PLAN FOR NEXT SESSION: update HEP as appropriate, educate on mechanical stressors and go through day to help with education on alleviation of these for the short term, exercise to improve intrascapular activation and strength for more balanced force couple and decrease over-reliance on UT, interventions to improve thoracic and AA/OA mobility. education   Camie SAUNDERS. Juli, PT, DPT, Cert. MDT 05/28/24, 12:14 PM  [facility] [address] [place] [address], [place] [address] P: [phone] I F: [phone]

## 2024-05-28 NOTE — Telephone Encounter (Signed)
 Pt would need HFU visit for me to be able to help with all that

## 2024-05-29 ENCOUNTER — Encounter: Payer: Self-pay | Admitting: Family Medicine

## 2024-05-29 ENCOUNTER — Other Ambulatory Visit: Payer: Self-pay | Admitting: Internal Medicine

## 2024-05-29 ENCOUNTER — Other Ambulatory Visit: Payer: Self-pay

## 2024-05-29 ENCOUNTER — Other Ambulatory Visit: Payer: Self-pay | Admitting: Family Medicine

## 2024-05-29 ENCOUNTER — Ambulatory Visit: Admitting: Family Medicine

## 2024-05-29 VITALS — BP 138/82 | HR 87 | Resp 16 | Ht 64.0 in | Wt 225.0 lb

## 2024-05-29 DIAGNOSIS — Z09 Encounter for follow-up examination after completed treatment for conditions other than malignant neoplasm: Secondary | ICD-10-CM | POA: Diagnosis not present

## 2024-05-29 DIAGNOSIS — M109 Gout, unspecified: Secondary | ICD-10-CM

## 2024-05-29 DIAGNOSIS — E1169 Type 2 diabetes mellitus with other specified complication: Secondary | ICD-10-CM | POA: Diagnosis not present

## 2024-05-29 DIAGNOSIS — Z794 Long term (current) use of insulin: Secondary | ICD-10-CM

## 2024-05-29 DIAGNOSIS — E785 Hyperlipidemia, unspecified: Secondary | ICD-10-CM

## 2024-05-29 DIAGNOSIS — I5032 Chronic diastolic (congestive) heart failure: Secondary | ICD-10-CM | POA: Diagnosis not present

## 2024-05-29 DIAGNOSIS — R079 Chest pain, unspecified: Secondary | ICD-10-CM

## 2024-05-29 DIAGNOSIS — E1165 Type 2 diabetes mellitus with hyperglycemia: Secondary | ICD-10-CM | POA: Diagnosis not present

## 2024-05-29 MED ORDER — INSULIN LISPRO (1 UNIT DIAL) 100 UNIT/ML (KWIKPEN): 10.0000 [IU] | PEN_INJECTOR | Freq: Three times a day (TID) | SUBCUTANEOUS | Status: DC

## 2024-05-29 MED ORDER — LANTUS SOLOSTAR 100 UNIT/ML ~~LOC~~ SOPN
20.0000 [IU] | PEN_INJECTOR | Freq: Every day | SUBCUTANEOUS | 1 refills | Status: AC
  Filled 2024-05-29: qty 15, 60d supply, fill #0

## 2024-05-29 MED ORDER — EMPAGLIFLOZIN 10 MG PO TABS
10.0000 mg | ORAL_TABLET | Freq: Every day | ORAL | 5 refills | Status: AC
  Filled 2024-05-29: qty 30, 30d supply, fill #0
  Filled 2024-07-09 – 2024-07-14 (×2): qty 30, 30d supply, fill #1
  Filled 2024-08-25: qty 30, 30d supply, fill #2
  Filled 2024-09-25: qty 30, 30d supply, fill #3
  Filled 2024-11-09 – 2024-11-13 (×2): qty 30, 30d supply, fill #4

## 2024-05-29 MED FILL — Amlodipine Besylate Tab 10 MG (Base Equivalent): ORAL | 30 days supply | Qty: 30 | Fill #0 | Status: AC

## 2024-05-29 NOTE — Patient Instructions (Signed)
  Lantus  (long acting insulin ) Inject 25 units at bedtime --------------------------------------------------------------------------------------------  Humalog  (short acting insulin ) Inject 10 units before meals --------------------------------------------------------------------------------------------  FOR HIGH BLOOD SUGARS before meals Humalog  sliding scale: If blood sugars are: Add: 201-250--------------------->add 2 units 251-300--------------------->add 4 units 301-350--------------------->add 6 units  351-400--------------------->add 8 units  >400 --------------------->add 10 units  -------------------------------------------------------------------------------------------- FOR LOW BLOOD SUGARS: 1) If sugar is under 70, drink 1/2 glass of juice or soda, or eat 15 grams of carbs 2) Wait 10-15 minutes, and then recheck your blood sugar 3) If your sugar is still under 70, repeat steps 1 + 2 4) If your sugar has become >70, you can take your insulin  shot and eat your meal.

## 2024-05-29 NOTE — Progress Notes (Signed)
 Name: Cory Rivas   MRN: [id number]    DOB: 10-12-1966   Date:05/29/2024       Progress Note  Chief Complaint  Patient presents with   Medication Refill    Insulin - Lantus  and Humalog    Diabetes   Hyperlipidemia     Subjective:   Cory Rivas is a 58 y.o. male, presents to clinic for routine follow up on chronic conditions  Was managed by [facility] but too far away, he is est with [place] [facility] but appts got pushed back and he is going to run out of insulin  before appt On mealtime insulin  10 unite + SS, basal insulin  20-25 units once daily and trulicity  3 mg weekly and jardiance  10 mg daily Lab Results  Component Value Date   HGBA1C 7.9 (A) 11/28/2023  Uncontrolled   HLD on statin and zetia  Lab Results  Component Value Date   CHOL 193 04/21/2024   HDL 47 04/21/2024   LDLCALC 83 04/21/2024   LDLDIRECT 106 (H) 11/15/2023   TRIG 314 (H) 04/21/2024   CHOLHDL 4.1 04/21/2024   Recent ER visits and admission for CP He has outpt cardiology f/up  Discussed the use of AI scribe software for clinical note transcription with the patient, who gave verbal consent to proceed.  History of Present Illness Cory Rivas is a 58 year old male with diabetes who presents with concerns about running out of insulin  before his next endocrinology appointment.  Insulin  supply management - Running out of Lantus  insulin  due to a rescheduled endocrinology appointment in [place]; next appointment is on October 3rd - Only one Lantus  pen remaining, raising concern about running out before the next visit - Adequate supply of Humalog , which is used three times daily with meals - Obtains insulin  from [employer] and stores it in the refrigerator  Diabetes mellitus management - Uses Humalog  (short-acting insulin ) at 10 units before meals - Uses Lantus  (long-acting insulin ) at 20 to 25 units nightly, dose adjusted based on blood glucose levels and sliding scale - Currently taking  Jardiance  10 mg and Trulicity  3 mg - Adjusts insulin  doses according to fasting blood sugars  Constitutional symptoms - Low energy since last hospital visit  Otolaryngologic symptoms - Difficulty removing an ear plug after recent MRI, resulting in bleeding from the right ear  Imaging and diagnostic testing - Recent MRI completed - Scheduled for a CT scan     Current Outpatient Medications:    allopurinol  (ZYLOPRIM ) 100 MG tablet, Take 1 tablet (100 mg total) by mouth daily as needed (Gout flare). Home med., Disp: , Rfl:    allopurinol  (ZYLOPRIM ) 300 MG tablet, Take 1 tablet (300 mg total) by mouth daily as needed (Gout flare). Home med., Disp: , Rfl:    amLODipine  (NORVASC ) 10 MG tablet, Take 1 tablet (10 mg total) by mouth daily., Disp: 30 tablet, Rfl: 0   aspirin  EC 81 MG tablet, Take 1 tablet (81 mg total) by mouth daily. Swallow whole., Disp: 90 tablet, Rfl: 3   baclofen  (LIORESAL ) 10 MG tablet, Take 1 tablet (10 mg total) by mouth 3 (three) times daily as needed for muscle spasms., Disp: 90 tablet, Rfl: 3   bisoprolol  (ZEBETA ) 5 MG tablet, Take 2 tablets (10 mg total) by mouth daily., Disp: 180 tablet, Rfl: 2   budesonide -formoterol  (SYMBICORT ) 160-4.5 MCG/ACT inhaler, Inhale 2 puffs into the lungs 2 (two) times daily., Disp: 10.2 g, Rfl: 12   cetirizine  (ZYRTEC ) 10 MG tablet,  Take 1 tablet (10 mg total) by mouth daily., Disp: 30 tablet, Rfl: 11   cromolyn  (OPTICROM ) 4 % ophthalmic solution, Place 1 drop into both eyes 4 (four) times daily as needed., Disp: 10 mL, Rfl: 2   Dulaglutide  (TRULICITY ) 3 MG/0.5ML SOAJ, Inject 3 mg into the skin once a week., Disp: 2 mL, Rfl: 10   DULoxetine  (CYMBALTA ) 30 MG capsule, Take 2 capsules (60 mg total) by mouth every morning AND 1 capsule (30 mg total) every evening., Disp: 270 capsule, Rfl: 0   empagliflozin  (JARDIANCE ) 10 MG TABS tablet, Take 1 tablet (10 mg total) by mouth daily., Disp: 90 tablet, Rfl: 1   ezetimibe  (ZETIA ) 10 MG tablet, Take  1 tablet (10 mg total) by mouth daily., Disp: 90 tablet, Rfl: 0   fluticasone  (FLONASE ) 50 MCG/ACT nasal spray, Place 1 spray into both nostrils daily., Disp: 16 g, Rfl: 6   ipratropium-albuterol  (DUONEB) 0.5-2.5 (3) MG/3ML SOLN, Take 3 mLs by nebulization every 6 (six) hours as needed., Disp: 180 mL, Rfl: 1   levocetirizine (XYZAL ) 5 MG tablet, Take 1 tablet (5 mg total) by mouth every evening., Disp: 90 tablet, Rfl: 1   nicotine  polacrilex (NICOTINE  MINI) 4 MG lozenge, Take 1 lozenge (4 mg total) by mouth as needed., Disp: 100 tablet, Rfl: 0   pregabalin  (LYRICA ) 50 MG capsule, Take 1 capsule (50 mg total) by mouth 2 (two) times daily as needed. Home med., Disp: , Rfl:    ranolazine  (RANEXA ) 500 MG 12 hr tablet, Take 1 tablet (500 mg total) by mouth 2 (two) times daily., Disp: 60 tablet, Rfl: 2   rosuvastatin  (CRESTOR ) 40 MG tablet, Take 1 tablet (40 mg total) by mouth daily., Disp: 90 tablet, Rfl: 0   spironolactone  (ALDACTONE ) 25 MG tablet, Take 1 tablet (25 mg total) by mouth daily., Disp: 90 tablet, Rfl: 3   Tiotropium Bromide  Monohydrate (SPIRIVA  RESPIMAT) 2.5 MCG/ACT AERS, Inhale 2 puffs into the lungs daily., Disp: 4 g, Rfl: 11   torsemide  (DEMADEX ) 20 MG tablet, Take 2 tablets (40 mg total) by mouth daily., Disp: 60 tablet, Rfl: 6   valsartan  (DIOVAN ) 80 MG tablet, Take 1 tablet (80 mg total) by mouth daily., Disp: 30 tablet, Rfl: 5   VENTOLIN  HFA 108 (90 Base) MCG/ACT inhaler, Inhale 2 puffs into the lungs every 6 (six) hours as needed for wheezing or shortness of breath., Disp: 18 g, Rfl: 2  Patient Active Problem List   Diagnosis Date Noted   Chronic kidney disease, stage 3a (HCC) 05/17/2024   Stable angina (HCC) 05/17/2024   Acute on chronic diastolic (congestive) heart failure (HCC) 04/03/2024   Class 3 obesity 02/18/2024   Nicotine  dependence 01/23/2024   Acute pulmonary edema (HCC) 01/22/2024   CHF (congestive heart failure) (HCC) 01/20/2024   COPD exacerbation (HCC)  10/21/2023   History of colon polyps 09/25/2023   Myocardial injury 08/19/2023   Type II diabetes mellitus with renal manifestations (HCC) 08/19/2023   Obesity (BMI 30-39.9) 08/19/2023   HLD (hyperlipidemia) 08/19/2023   Chronic diastolic CHF (congestive heart failure) (HCC) 08/19/2023   Diarrhea 08/19/2023   CAD (coronary artery disease) 08/19/2023   CKD (chronic kidney disease) stage 2, GFR 60-89 ml/min 08/19/2023   Acute on chronic diastolic heart failure (HCC) 05/30/2023   Unstable angina (HCC) 05/29/2023   Pleuritic chest pain 05/27/2023   Type 2 diabetes mellitus with peripheral neuropathy (HCC) 05/27/2023   Gastroesophageal reflux disease without esophagitis 05/27/2023   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  Dyslipidemia 05/27/2023   Hypertensive emergency 11/22/2022   Acute respiratory failure with hypoxia (HCC) 11/22/2022   Elevated troponin 11/22/2022   Acute on chronic heart failure with preserved ejection fraction (HFpEF) (HCC) 08/24/2022   Acute on chronic diastolic congestive heart failure (HCC) 08/23/2022   Class 2 obesity 08/23/2022   Uncontrolled diabetes mellitus with hyperglycemia, with long-term current use of insulin  (HCC) 08/23/2022   Grade I diastolic dysfunction 06/08/2022   LVH (left ventricular hypertrophy) 06/08/2022   Accelerated hypertension 06/08/2022   S/P cervical spinal fusion 08/18/2021   Former heavy tobacco smoker 08/18/2021   Gout 08/18/2021   Other chronic pain 10/26/2020   Systolic murmur 10/26/2020   Tobacco use 10/26/2020   Polyp of ascending colon    Rectal polyp    Class 2 severe obesity with serious comorbidity and body mass index (BMI) of 39.0 to 39.9 in adult [facility]) 04/15/2019   Chronic kidney disease (CKD) stage G2/A3, mildly decreased glomerular filtration rate (GFR) between 60-89 mL/min/1.73 square meter and albuminuria creatinine ratio greater than 300 mg/g 04/15/2019   Proteinuria 04/15/2019   Hyperlipidemia associated with type 2  diabetes mellitus (HCC) 03/18/2019   Diabetes mellitus (HCC) 03/18/2019   Idiopathic chronic gout of multiple sites without tophus 03/18/2019   Essential hypertension 03/18/2019   Dermatitis 03/18/2019   Lumbar degenerative disc disease 11/22/2015   Cervical myelopathy (HCC) 11/22/2015   Osteoarthritis of spine with radiculopathy, lumbar region 11/22/2015   Degenerative disc disease, cervical 11/22/2015   Osteoarthritis of spine with radiculopathy, cervical region 11/22/2015    Past Surgical History:  Procedure Laterality Date   BACK SURGERY     CARDIAC CATHETERIZATION     CHOLECYSTECTOMY     COLONOSCOPY WITH PROPOFOL  N/A 03/10/2020   Procedure: COLONOSCOPY WITH PROPOFOL ;  Surgeon: Jinny Carmine, MD;  Location[facility] ENDOSCOPY;  Service: Endoscopy;  Laterality: N/A;   COLONOSCOPY WITH PROPOFOL  N/A 09/25/2023   Procedure: COLONOSCOPY WITH PROPOFOL ;  Surgeon: Jinny Carmine, MD;  Location: [facility] ENDOSCOPY;  Service: Endoscopy;  Laterality: N/A;   CORONARY PRESSURE/FFR STUDY N/A 05/29/2023   Procedure: CORONARY PRESSURE/FFR STUDY;  Surgeon: Mady Bruckner, MD;  Location: [facility] INVASIVE CV LAB;  Service: Cardiovascular;  Laterality: N/A;   POLYPECTOMY  09/25/2023   Procedure: POLYPECTOMY;  Surgeon: Jinny Carmine, MD;  Location: [facility] ENDOSCOPY;  Service: Endoscopy;;   RIGHT/LEFT HEART CATH AND CORONARY ANGIOGRAPHY N/A 05/29/2023   Procedure: RIGHT/LEFT HEART CATH AND CORONARY ANGIOGRAPHY;  Surgeon: Mady Bruckner, MD;  Location: [facility] INVASIVE CV LAB;  Service: Cardiovascular;  Laterality: N/A;   RIGHT/LEFT HEART CATH AND CORONARY ANGIOGRAPHY Bilateral 10/30/2023   Procedure: RIGHT/LEFT HEART CATH AND CORONARY ANGIOGRAPHY;  Surgeon: Mady Bruckner, MD;  Location: [facility] INVASIVE CV LAB;  Service: Cardiovascular;  Laterality: Bilateral;    Family History  Problem Relation Age of Onset   Heart disease Mother    Diabetes Mother    Hyperlipidemia Mother    Hypertension Mother    Heart attack  Mother    Lung cancer Father    Hypertension Father    Stroke Father    AAA (abdominal aortic aneurysm) Maternal Grandmother    Heart attack Maternal Grandmother    Diabetes Maternal Grandfather     Social History   Tobacco Use   Smoking status: Every Day    Current packs/day: 0.00    Average packs/day: 1 pack/day for 37.0 years (37.0 ttl pk-yrs)    Types: Cigarettes    Start date: 10/19/1989    Last attempt to quit: 08/20/2022  Years since quitting: 1.7   Smokeless tobacco: Never   Tobacco comments:    30+ years hx smoking 2 ppd, stopped smoking briefly in April 2022 for a surgery and then restarted smoking.  As of August 2025, he smoking about 2 to 4 cigarettes a day.  Vaping Use   Vaping status: Never Used  Substance Use Topics   Alcohol use: No   Drug use: No     Allergies  Allergen Reactions   Oxycodone  Nausea And Vomiting   Entresto  [Sacubitril -Valsartan ] Nausea Only and Other (See Comments)    Lightheaded and dizzy    Onion Nausea And Vomiting   Nitroglycerin  Itching and Rash    IV product, patient says SL tablet is fine    Health Maintenance  Topic Date Due   HEMOGLOBIN A1C  02/25/2024   Diabetic kidney evaluation - Urine ACR  03/20/2024   OPHTHALMOLOGY EXAM  05/29/2024 (Originally 03/04/2024)   COVID-19 Vaccine (4 - 2024-25 season) 06/14/2024 (Originally 06/16/2023)   INFLUENZA VACCINE  01/12/2025 (Originally 05/15/2024)   Hepatitis B Vaccines 19-59 Average Risk (1 of 3 - 19+ 3-dose series) 05/29/2025 (Originally 02/12/1986)   FOOT EXAM  08/27/2024   Lung Cancer Screening  12/28/2024   Diabetic kidney evaluation - eGFR measurement  05/17/2025   Colonoscopy  09/24/2028   DTaP/Tdap/Td (2 - Td or Tdap) 04/14/2029   Pneumococcal Vaccine: 50+ Years  Completed   Hepatitis C Screening  Completed   HIV Screening  Completed   Zoster Vaccines- Shingrix   Completed   HPV VACCINES  Aged Out   Meningococcal B Vaccine  Aged Out   Pneumococcal Vaccine  Discontinued     Chart Review Today: I personally reviewed active problem list, medication list, allergies, family history, social history, health maintenance, notes from last encounter, lab results, imaging with the patient/caregiver today.   Review of Systems  Constitutional: Negative.   HENT: Negative.    Eyes: Negative.   Respiratory: Negative.    Cardiovascular: Negative.   Gastrointestinal: Negative.   Endocrine: Negative.   Genitourinary: Negative.   Musculoskeletal: Negative.   Skin: Negative.   Allergic/Immunologic: Negative.   Neurological: Negative.   Hematological: Negative.   Psychiatric/Behavioral: Negative.    All other systems reviewed and are negative.    Objective:   Vitals:   05/29/24 1443  BP: 138/82  Pulse: 87  Resp: 16  SpO2: 98%  Weight: 225 lb (102.1 kg)  Height: 5' 4 (1.626 m)    Body mass index is 38.62 kg/m.  Physical Exam Vitals and nursing note reviewed.  Constitutional:      General: He is not in acute distress.    Appearance: Normal appearance. He is well-developed. He is obese. He is not ill-appearing, toxic-appearing or diaphoretic.  HENT:     Head: Normocephalic and atraumatic.     Right Ear: Tympanic membrane, ear canal and external ear normal.     Left Ear: Tympanic membrane, ear canal and external ear normal.     Nose: Nose normal.  Eyes:     General: No scleral icterus.       Right eye: No discharge.        Left eye: No discharge.     Conjunctiva/sclera: Conjunctivae normal.  Neck:     Trachea: No tracheal deviation.  Cardiovascular:     Rate and Rhythm: Normal rate.  Pulmonary:     Effort: Pulmonary effort is normal. No respiratory distress.     Breath sounds: No stridor.  Skin:  General: Skin is warm and dry.     Findings: No rash.  Neurological:     Mental Status: He is alert.     Motor: No abnormal muscle tone.     Coordination: Coordination normal.     Gait: Gait normal.  Psychiatric:        Mood and Affect: Mood  normal.        Behavior: Behavior normal.        Results for orders placed or performed during the hospital encounter of 05/17/24  Basic metabolic panel   Collection Time: 05/17/24  1:15 AM  Result Value Ref Range   Sodium 137 135 - 145 mmol/L   Potassium 3.5 3.5 - 5.1 mmol/L   Chloride 101 98 - 111 mmol/L   CO2 24 22 - 32 mmol/L   Glucose, Bld 360 (H) 70 - 99 mg/dL   BUN 15 6 - 20 mg/dL   Creatinine, Ser 8.52 (H) 0.61 - 1.24 mg/dL   Calcium  8.7 (L) 8.9 - 10.3 mg/dL   GFR, Estimated 55 (L) >60 mL/min   Anion gap 12 5 - 15  CBC   Collection Time: 05/17/24  1:15 AM  Result Value Ref Range   WBC 11.0 (H) 4.0 - 10.5 K/uL   RBC 5.22 4.22 - 5.81 MIL/uL   Hemoglobin 15.0 13.0 - 17.0 g/dL   HCT 55.2 60.9 - 47.9 %   MCV 85.6 80.0 - 100.0 fL   MCH 28.7 26.0 - 34.0 pg   MCHC 33.6 30.0 - 36.0 g/dL   RDW 86.3 88.4 - 84.4 %   Platelets 174 150 - 400 K/uL   nRBC 0.0 0.0 - 0.2 %  Brain natriuretic peptide   Collection Time: 05/17/24  1:15 AM  Result Value Ref Range   B Natriuretic Peptide 32.4 0.0 - 100.0 pg/mL  Troponin I (High Sensitivity)   Collection Time: 05/17/24  1:15 AM  Result Value Ref Range   Troponin I (High Sensitivity) 44 (H) <18 ng/L  Troponin I (High Sensitivity)   Collection Time: 05/17/24  3:06 AM  Result Value Ref Range   Troponin I (High Sensitivity) 59 (H) <18 ng/L  CBG monitoring, ED   Collection Time: 05/17/24  7:56 AM  Result Value Ref Range   Glucose-Capillary 213 (H) 70 - 99 mg/dL  Troponin I (High Sensitivity)   Collection Time: 05/17/24  9:44 AM  Result Value Ref Range   Troponin I (High Sensitivity) 65 (H) <18 ng/L  Sedimentation rate   Collection Time: 05/17/24 10:43 AM  Result Value Ref Range   Sed Rate 34 (H) 0 - 20 mm/hr  C-reactive protein   Collection Time: 05/17/24 10:43 AM  Result Value Ref Range   CRP 1.8 (H) <1.0 mg/dL  CBG monitoring, ED   Collection Time: 05/17/24 11:39 AM  Result Value Ref Range   Glucose-Capillary 311 (H) 70 -  99 mg/dL  Troponin I (High Sensitivity)   Collection Time: 05/17/24 11:42 AM  Result Value Ref Range   Troponin I (High Sensitivity) 64 (H) <18 ng/L  Glucose, capillary   Collection Time: 05/17/24  1:39 PM  Result Value Ref Range   Glucose-Capillary 218 (H) 70 - 99 mg/dL      Assessment & Plan:   1. Uncontrolled diabetes mellitus with hyperglycemia, with long-term current use of insulin  (HCC) (Primary) - POC A1c - Urine Microalbumin w/creat. ratio - empagliflozin  (JARDIANCE ) 10 MG TABS tablet; Take 1 tablet (10 mg total) by mouth daily.  Dispense: 30 tablet; Refill: 5 - insulin  glargine (LANTUS  SOLOSTAR) 100 UNIT/ML Solostar Pen; Inject 20-25 Units into the skin at bedtime.  Dispense: 30 mL; Refill: 1 - insulin  lispro (HUMALOG ) 100 UNIT/ML KwikPen; Inject 10 Units into the skin 3 (three) times daily. Plus sliding scale up per instructions  2. Chronic diastolic CHF (congestive heart failure) (HCC) F/up cardiology, currently appears euvolemic  3. Hyperlipidemia associated with type 2 diabetes mellitus (HCC) Lipids done recent OV, continue statin and zetia   4. Chest pain, unspecified type Recurrent - f/up with cardiology  5. 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 Out of Lantus , has one pen left. Adequate Humalog  and Trulicity  supply. Current regimen: Humalog  10 units before meals + SS: FOR HIGH BLOOD SUGARS before meals Humalog  sliding scale: If blood sugars are: Add: 201-250--------------------->add 2 units 251-300--------------------->add 4 units 301-350--------------------->add 6 units  351-400--------------------->add 8 units  >400 --------------------->add 10 units  Lantus  20-25 units at  bedtime, adjusted by fasting blood sugars with 2 unit adjustments and waiting several days before next adjustment.  - Send prescriptions for Trulicity , Humalog , and Lantus  to [facility]. Or at least get them all back on med list for what he has supply at home - Perform A1c finger stick and obtain urine sample for diabetic kidney disease screening. - Ensure insulin  regimen is accurately reflected on medical chart.  Impacted cerumen, right ear Difficulty with ear plugs, blood on ear plug. Dark wax in right ear canal, no active bleeding or swelling. Wax brownish-red, coating ear canal and eardrum. No immediate intervention needed unless symptoms develop. - Consider ear drops if pain or swelling develops.  Recording duration: 13 minutes     Return in about 6 months (around 11/29/2024) for Routine follow-up.   Michelene Cower, PA-C 05/29/24 3:17 PM

## 2024-05-30 DIAGNOSIS — M4312 Spondylolisthesis, cervical region: Secondary | ICD-10-CM | POA: Diagnosis not present

## 2024-05-30 DIAGNOSIS — M47812 Spondylosis without myelopathy or radiculopathy, cervical region: Secondary | ICD-10-CM | POA: Diagnosis not present

## 2024-05-30 DIAGNOSIS — Z981 Arthrodesis status: Secondary | ICD-10-CM | POA: Diagnosis not present

## 2024-05-30 LAB — MICROALBUMIN / CREATININE URINE RATIO
Creatinine, Urine: 27 mg/dL (ref 20–320)
Microalb Creat Ratio: 1381 mg/g{creat} — ABNORMAL HIGH (ref <30)
Microalb, Ur: 37.3 mg/dL

## 2024-05-31 ENCOUNTER — Other Ambulatory Visit: Payer: Self-pay

## 2024-06-01 ENCOUNTER — Other Ambulatory Visit: Payer: Self-pay

## 2024-06-02 ENCOUNTER — Ambulatory Visit: Admitting: Physical Therapy

## 2024-06-02 ENCOUNTER — Other Ambulatory Visit: Payer: Self-pay

## 2024-06-02 ENCOUNTER — Encounter: Payer: Self-pay | Admitting: Physical Therapy

## 2024-06-02 DIAGNOSIS — M62838 Other muscle spasm: Secondary | ICD-10-CM

## 2024-06-02 DIAGNOSIS — M5412 Radiculopathy, cervical region: Secondary | ICD-10-CM | POA: Diagnosis not present

## 2024-06-02 DIAGNOSIS — M6281 Muscle weakness (generalized): Secondary | ICD-10-CM | POA: Diagnosis not present

## 2024-06-02 DIAGNOSIS — M542 Cervicalgia: Secondary | ICD-10-CM

## 2024-06-02 DIAGNOSIS — R293 Abnormal posture: Secondary | ICD-10-CM | POA: Diagnosis not present

## 2024-06-02 MED FILL — Allopurinol Tab 100 MG: ORAL | 30 days supply | Qty: 30 | Fill #0 | Status: AC

## 2024-06-02 MED FILL — Rosuvastatin Calcium Tab 40 MG: ORAL | 30 days supply | Qty: 30 | Fill #0 | Status: AC

## 2024-06-02 MED FILL — Ezetimibe Tab 10 MG: ORAL | 30 days supply | Qty: 30 | Fill #0 | Status: AC

## 2024-06-02 MED FILL — Duloxetine HCl Enteric Coated Pellets Cap 30 MG (Base Eq): ORAL | 30 days supply | Qty: 90 | Fill #0 | Status: AC

## 2024-06-02 NOTE — Therapy (Signed)
 OUTPATIENT PHYSICAL THERAPY TREATMENT   Patient Name: Cory Rivas MRN: [id number] DOB:June 15, 1966, 58 y.o., male Today's Date: 06/02/2024  END OF SESSION:  PT End of Session - 06/02/24 1016     Visit Number 4    Number of Visits 17    Date for PT Re-Evaluation 08/11/24    Authorization Type [place] [employer] reporting period from 05/19/2024    Authorization Time Period HB auth#[id number] for 13 PT vsts from 8/7-11/5    Authorization - Visit Number 3    Authorization - Number of Visits 13    Progress Note Due on Visit 10    PT Start Time 1017    PT Stop Time 1113    PT Time Calculation (min) 56 min    Activity Tolerance Patient tolerated treatment well    Behavior During Therapy [facility] for tasks assessed/performed             Past Medical History:  Diagnosis Date   Bulging of cervical intervertebral disc    CAD (coronary artery disease)    a. 09/2020 MV: low risk; b. 05/2023 Cath: Moderate LAD dzs w/ seq 50-60% stenoses and nl RFR 0.93. Mod OM dzs->Med rx; b. 10/2023 Cath: LM min irregs, LAD 55ost, 55p/m, LCX large nl, OM1 50, RCA mod, nl.   Cervical myelopathy (HCC)    Chronic heart failure with preserved ejection fraction (HFpEF) (HCC)    a. 05/2023 Echo: EF 50-55%, no rwma, GrIII DD, nl RV fxn, mild MR, Ao sclerosis; b. 10/2023 Echo: EF 55-60%, no rwma, nl RV fxn, mild MR, Ao sclerosis.   CKD (chronic kidney disease), stage III (HCC)    COPD (chronic obstructive pulmonary disease) (HCC)    Diabetes mellitus without complication (HCC)    Gout    Hyperlipidemia LDL goal <70    Hypertension    Morbid obesity (HCC)    OSA (obstructive sleep apnea)    Stress due to family tension 04/15/2019   Tracheobronchomalacia    Past Surgical History:  Procedure Laterality Date   BACK SURGERY     CARDIAC CATHETERIZATION     CHOLECYSTECTOMY     COLONOSCOPY WITH PROPOFOL  N/A 03/10/2020   Procedure: COLONOSCOPY WITH PROPOFOL ;  Surgeon: Jinny Carmine, MD;  Location: [facility] ENDOSCOPY;   Service: Endoscopy;  Laterality: N/A;   COLONOSCOPY WITH PROPOFOL  N/A 09/25/2023   Procedure: COLONOSCOPY WITH PROPOFOL ;  Surgeon: Jinny Carmine, MD;  Location: [facility] ENDOSCOPY;  Service: Endoscopy;  Laterality: N/A;   CORONARY PRESSURE/FFR STUDY N/A 05/29/2023   Procedure: CORONARY PRESSURE/FFR STUDY;  Surgeon: Mady Bruckner, MD;  Location: [facility] INVASIVE CV LAB;  Service: Cardiovascular;  Laterality: N/A;   POLYPECTOMY  09/25/2023   Procedure: POLYPECTOMY;  Surgeon: Jinny Carmine, MD;  Location: [facility] ENDOSCOPY;  Service: Endoscopy;;   RIGHT/LEFT HEART CATH AND CORONARY ANGIOGRAPHY N/A 05/29/2023   Procedure: RIGHT/LEFT HEART CATH AND CORONARY ANGIOGRAPHY;  Surgeon: Mady Bruckner, MD;  Location: [facility] INV[facility] CV [facility];  Service: Cardiovascular;  Laterality: N/A;   RIGHT/LEFT HEART CATH AND CORONARY ANGIOGRAPHY Bilateral 10/30/2023   Procedure: RIGHT/LEFT HEART CATH AND CORONARY ANGIOGRAPHY;  Surgeon: Mady Bruckner, MD;  Location: [facility] INV[facility] CV [facility];  Service: Cardiovascular;  Laterality: Bilateral;   Patient Active Problem List   Diagnosis Date Noted   Chronic kidney disease, stage 3a (HCC) 05/17/2024   Stable angina (HCC) 05/17/2024   Acute on chronic diastolic (congestive) heart failure (HCC) 04/03/2024   Class 3 obesity 02/18/2024   Nicotine  dependence 01/23/2024   Acute pulmonary edema (  HCC) 01/22/2024   CHF (congestive heart failure) (HCC) 01/20/2024   COPD exacerbation (HCC) 10/21/2023   History of colon polyps 09/25/2023   Myocardial injury 08/19/2023   Type II diabetes mellitus with renal manifestations (HCC) 08/19/2023   Obesity (BMI 30-39.9) 08/19/2023   HLD (hyperlipidemia) 08/19/2023   Chronic diastolic CHF (congestive heart failure) (HCC) 08/19/2023   Diarrhea 08/19/2023   CAD (coronary artery disease) 08/19/2023   CKD (chronic kidney disease) stage 2, GFR 60-89 ml/min 08/19/2023   Acute on chronic diastolic heart failure (HCC) 05/30/2023   Unstable angina (HCC)  05/29/2023   Pleuritic chest pain 05/27/2023   Type 2 diabetes mellitus with peripheral neuropathy (HCC) 05/27/2023   Gastroesophageal reflux disease without esophagitis 05/27/2023   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   Dyslipidemia 05/27/2023   Hypertensive emergency 11/22/2022   Acute respiratory failure with hypoxia (HCC) 11/22/2022   Elevated troponin 11/22/2022   Acute on chronic heart failure with preserved ejection fraction (HFpEF) (HCC) 08/24/2022   Acute on chronic diastolic congestive heart failure (HCC) 08/23/2022   Class 2 obesity 08/23/2022   Uncontrolled diabetes mellitus with hyperglycemia, with long-term current use of insulin  (HCC) 08/23/2022   Grade I diastolic dysfunction 06/08/2022   LVH (left ventricular hypertrophy) 06/08/2022   Accelerated hypertension 06/08/2022   S/P cervical spinal fusion 08/18/2021   Former heavy tobacco smoker 08/18/2021   Gout 08/18/2021   Other chronic pain 10/26/2020   Systolic murmur 10/26/2020   Tobacco use 10/26/2020   Polyp of ascending colon    Rectal polyp    Class 2 severe obesity with serious comorbidity and body mass index (BMI) of 39.0 to 39.9 in adult [facility]) 04/15/2019   Chronic kidney disease (CKD) stage G2/A3, mildly decreased glomerular filtration rate (GFR) between 60-89 mL/min/1.73 square meter and albuminuria creatinine ratio greater than 300 mg/g 04/15/2019   Proteinuria 04/15/2019   Hyperlipidemia associated with type 2 diabetes mellitus (HCC) 03/18/2019   Diabetes mellitus (HCC) 03/18/2019   Idiopathic chronic gout of multiple sites without tophus 03/18/2019   Essential hypertension 03/18/2019   Dermatitis 03/18/2019   Lumbar degenerative disc disease 11/22/2015   Cervical myelopathy (HCC) 11/22/2015   Osteoarthritis of spine with radiculopathy, lumbar region 11/22/2015   Degenerative disc disease, cervical 11/22/2015   Osteoarthritis of spine with radiculopathy, cervical region 11/22/2015    PCP: Leavy Mole,  PA-C  REFERRING PROVIDER: Corrothers, Duwaine Norris, NP (neurosurgery)  REFERRING DIAG:  s/p cervical spinal fusion, neck pain, cervicalgia  THERAPY DIAG:  Cervicalgia  Radiculopathy, cervical region  Muscle weakness (generalized)  Other muscle spasm  Rationale for Evaluation and Treatment: Rehabilitation  ONSET DATE: chronic over many years but underwent C2-6 posterior laminectomy with C2-T2 instrumentation and fusion with Dr. Salina in April 2022   SUBJECTIVE:  PERTINENT HISTORY:  Patient is a 58 y.o. male who presents to outpatient physical therapy with a referral for medical diagnosis  s/p cervical spinal fusion, neck pain, cervicalgia. This patient's chief complaints consist of chronic neck pain and stiffness, UE paresthesia, weakness, and incoordination leading to the following functional deficits: difficulty with usual activities such as using his tractor, being active, personal care, lifting, reading, concentration, work, driving, sleeping, reaching, holding and manipulating things such as a cup and utinsils with hands, eating, shopping, carrying groceries, and recreation. Relevant past medical history and comorbidities include the following: he has Lumbar degenerative disc disease; Cervical myelopathy (HCC); Osteoarthritis of spine with radiculopathy, lumbar region; Hyperlipidemia associated with type 2 diabetes mellitus (HCC); Diabetes mellitus (HCC); Idiopathic chronic gout of multiple sites without tophus; Essential hypertension; Dermatitis; Class 2 severe obesity with serious comorbidity and body mass index (BMI) of 39.0 to 39.9 in adult [facility]); Chronic kidney disease (CKD) stage G2/A3, mildly decreased glomerular filtration rate (GFR) between 60-89 mL/min/1.73 square meter and  albuminuria creatinine ratio greater than 300 mg/g; Proteinuria; Polyp of ascending colon; Rectal polyp; Other chronic pain; Systolic murmur; Tobacco use; S/P cervical spinal fusion; Former heavy tobacco smoker; Gout; Grade I diastolic dysfunction; LVH (left ventricular hypertrophy); Accelerated hypertension; Acute on chronic diastolic congestive heart failure (HCC); Class 2 obesity; Uncontrolled diabetes mellitus with hyperglycemia, with long-term current use of insulin  (HCC); Acute on chronic heart failure with preserved ejection fraction (HFpEF) (HCC); Hypertensive emergency; Acute respiratory failure with hypoxia (HCC); Elevated troponin; Pleuritic chest pain; Type 2 diabetes mellitus with peripheral neuropathy (HCC); Gastroesophageal reflux disease without esophagitis; COPD with asthma (HCC); Dyslipidemia; Unstable angina (HCC); Acute on chronic diastolic heart failure (HCC); Myocardial injury; Type II diabetes mellitus with renal manifestations (HCC); Obesity (BMI 30-39.9); HLD (hyperlipidemia); Chronic diastolic CHF (congestive heart failure) (HCC); Diarrhea; CAD (coronary artery disease); CKD (chronic kidney disease) stage 2, GFR 60-89 ml/min; Degenerative disc disease, cervical; Osteoarthritis of spine with radiculopathy, cervical region; History of colon polyps; COPD exacerbation (HCC); CHF (congestive heart failure) (HCC); Acute pulmonary edema (HCC); Nicotine  dependence; Class 3 obesity; Acute on chronic diastolic (congestive) heart failure (HCC); Chronic kidney disease, stage 3a (HCC); and Stable angina (HCC) on their problem list. he  has a past medical history of Bulging of cervical intervertebral disc, CAD (coronary artery disease), Cervical myelopathy (HCC), Chronic heart failure with preserved ejection fraction (HFpEF) (HCC), CKD (chronic kidney disease), stage III (HCC), COPD (chronic obstructive pulmonary disease) (HCC), Gout, Hyperlipidemia LDL goal <70, Hypertension, Morbid obesity (HCC), OSA  (obstructive sleep apnea), Stress due to family tension (04/15/2019), and Tracheobronchomalacia. he  has a past surgical history that includes Cholecystectomy; Colonoscopy with propofol  (N/A, 03/10/2020); RIGHT/LEFT HEART CATH AND CORONARY ANGIOGRAPHY (N/A, 05/29/2023); CORONARY PRESSURE/FFR STUDY (N/A, 05/29/2023); Cardiac catheterization; Back surgery; Colonoscopy with propofol  (N/A, 09/25/2023); polypectomy (09/25/2023); and RIGHT/LEFT HEART CATH AND CORONARY ANGIOGRAPHY (Bilateral, 10/30/2023). Patient denies hx of cancer, stroke, seizures, unexplained weight loss, unexplained changes in bowel or bladder problems, and osteoporosis  Exercise history: no weights at home.   SUBJECTIVE STATEMENT: Patient states he is feeling well with some stiffness in his B UT region. He reports no changes in his health since last PT session.   Top three problems list: hand grip/holding things, tightness in B UT (driving, walking, sitting, UBE,   PAIN: Are you having pain? NPRS: Current: stiffness in B UT  From initial eval 05/19/24:  Best: 2/10, Worst: 10/10. Pain location: back of neck down to just past the scapulae, bilateral upper traps  to tops of shoulders, sore over the clavicles and throat to touch. Numbness and tingling bilateral hands (R > L). Numbness in the R hand 2nd and 3rd digits all the time, but can be all the fingers of the R hand. He gets shooting down the left arm to the middle finger and increased numbness spreading from that finger.  Pain description: I can feel where they cut me, muscle spasm (vibrating), choking, cramp Aggravating factors: bending, lifting, raking, driving for a long distance Relieving factors: stay active, getting out to do things he usually does   PRECAUTIONS: Other: lifting heavy stuff and cannot remember other stuff, does not want him to over do stuff, cut back on some of the stuff he is doing at the house. He mentions bed rest but also that the doctor told him to come  to physical therapy. Repeatedly hospitalized for complications with his heart (most recently afib)   PATIENT GOALS: like get everything loosened up like it was when he left PT last time, and try to get the pain and tightness to quit.   NEXT MD VISIT: MRI on 05/23/2024 followed by a phone call after the results are in.  OBJECTIVE  NEUROLOGIC ULNT (06/02/24) Median: positive on left, worse on right with pain in mid back sensitive to hand position.  Ulnar: negative on left, positive on right Radial: positive on left but unclear how far into ROM, more positive on R with wrist position affecting pain at UT even in 30 degrees shoulder flexion.   TREATMENT                                                                                                                            Therapeutic activities: dynamic therapeutic activities incorporating MULTIPLE parameters or areas of the body designed to achieve improved functional performance. Upper body ergometer level 11 to encourage joint nutrition, warm tissue, induce analgesic effect of aerobic exercise, improve muscular strength and endurance,  and prepare for remainder of session. 4 min total (alternating forwards/backwards each min).   Sidelying modified open book with hand on chest and both knees up 1x10 each side with exhale  To improve thoracic mobility With clinician OP to  improve ability to reach and view surroundings  Quadruped thoracic rotation with left hand behind back 1x10 each side with breath (exhale on rotation) Breaks as needed for knee pian To improve ability to reach and view surroundings  Neuromuscular Re-education: a technique or exercise performed with the goal of improving the level of communication between the body and the brain, such as for balance, motor control, muscle activation patterns, coordination, desensitization, quality of muscle contraction, proprioception, and/or kinesthetic sense needed for successful and  safe completion of functional activities.   Seated lat pull for lower trap activation and to decrease neural drive of UT while improving grip strength 1x15 with 25# cable 2x15 with 25# cable Assistance with set up and putting bar back Cuing for motor control/technique  Numbness in both hands  Prone scapular retraction with shoulder extension to improve intrascapular activation and inhibit UT  1x10 with 2 second holds Cuing for improved motor control   Manual therapy: to reduce pain and tissue tension, improve range of motion, neuromodulation, in order to promote improved ability to complete functional activities. PRONE Thoracic gapping AP sustained hold at end range with KE wedge while patient takes deep breaths.  1x5 breaths each segment from T10 to T4  Rib spring with pressure to improve rib cage mobility Several reps along mid to upper ribs bilaterally  STM to B UT and thoracic paraspinals and periscapular muscles  SIDELYING Rotational thoracic spine gapping mobilization along thoracic spine  STM to bilateral thoracic and lumbar paraspinals and intrascapular muscles  Pt required multimodal cuing for proper technique and to facilitate improved neuromuscular control, strength, range of motion, and functional ability resulting in improved performance and form.    PATIENT EDUCATION:  Education details: Exercise purpose/form. Self management techniques. Education on diagnosis, prognosis, POC, anatomy and physiology of current condition. Education on HEP  Person educated: Patient Education method: Explanation, Demonstration, Tactile cues, and Verbal cues Education comprehension: verbalized understanding, returned demonstration, and needs further education  HOME EXERCISE PROGRAM: Access Code: [email or web address] URL: [email or web address] Date: 05/21/2024 Prepared by: Camie Cleverly  Exercises - Sidelying Shoulder External Rotation  - 1-2 x daily - 2 sets - 10 reps - 5  seconds hold - Sidelying Open Book  - 1-2 x daily - 2 sets - 10 reps - 1 or more breaths hold - Lat Pull with band/cable  - 1-2 x daily - 2 sets - 10 reps - 5 seconds hold  ASSESSMENT:  CLINICAL IMPRESSION: Patient continued to report numbness in B UT today but with significant reduction of sensation of tightness by end of session. He is very stiff in his thoracic spine and improving mobility here will improve his ability to function and decrease stress at the cervical spine where his C2 to T2 fusion severely limits his motion. He would also benefit from interventions for mobility at OA and AA joints. Unfortunately due to financial limitation he is unable to attend PT again until he gets paid on Sep 1. Patient encouraged to keep up with his home exercises for improving thoracic mobility and intrascapular strength. Patient would benefit from continued management of limiting condition by skilled physical therapist to address remaining impairments and functional limitations to work towards stated goals and return to PLOF or maximal functional independence.   From initial PT evaluation on 05/19/24:  Patient is a 58 y.o. male referred to outpatient physical therapy with a medical diagnosis of  s/p cervical spinal fusion, neck pain, cervicalgia who presents with signs and symptoms consistent with chronic neck pain, postural changes, bilateral radiculopathy with load sensitivity and likely nerve tension sensitivity. Traction alleviation test positive for load sensitivity. Patient has a lot of stiffness in muscles and joints in the regions surrounding his C2-T2 fusion that appear to be leading to a lot of his discomfort and functional difficulty. Patient presents with significant pain, joint stiffness, posture, nerve stretch sensitivity, load sensitivity, ROM, muscle performance (strength/power/endurance), paresthesia, motor control, knowledge, and activity tolerance impairments that are limiting ability to complete  difficulty with usual activities such as using his tractor, being active, personal care, lifting, reading, concentration, work, driving, sleeping, reaching, holding and manipulating things such as a cup and utinsils with hands, eating, shopping, carrying groceries, and recreation without difficulty. Patient will benefit from skilled physical  therapy intervention to address current body structure impairments and activity limitations to improve function and work towards goals set in current POC in order to return to prior level of function or maximal functional improvement.    OBJECTIVE IMPAIRMENTS: cardiopulmonary status limiting activity, decreased activity tolerance, decreased coordination, decreased endurance, decreased knowledge of condition, decreased ROM, decreased strength, hypomobility, increased edema, increased fascial restrictions, increased muscle spasms, impaired flexibility, impaired sensation, impaired UE functional use, improper body mechanics, postural dysfunction, obesity, and pain.   ACTIVITY LIMITATIONS: carrying, lifting, bending, sitting, bed mobility, reach over head, hygiene/grooming, and caring for others and   PARTICIPATION LIMITATIONS: meal prep, cleaning, laundry, driving, shopping, community activity, occupation, yard work, and  difficulty with usual activities such as using his tractor, being active, personal care, lifting, reading, concentration, work, driving, sleeping, reaching, holding and manipulating things such as a cup and utinsils with hands, eating, shopping, carrying groceries, and recreation  PERSONAL FACTORS: Behavior pattern, Fitness, Past/current experiences, Time since onset of injury/illness/exacerbation, and 3+ comorbidities:  Cervical myelopathy (HCC); Osteoarthritis of spine with radiculopathy, lumbar region; Hyperlipidemia associated with type 2 diabetes mellitus (HCC); Diabetes mellitus (HCC); Idiopathic chronic gout of multiple sites without tophus;  Essential hypertension; Dermatitis; Class 2 severe obesity with serious comorbidity and body mass index (BMI) of 39.0 to 39.9 in adult [facility]); Chronic kidney disease (CKD) stage G2/A3, mildly decreased glomerular filtration rate (GFR) between 60-89 mL/min/1.73 square meter and albuminuria creatinine ratio greater than 300 mg/g; Proteinuria; Polyp of ascending colon; Rectal polyp; Other chronic pain; Systolic murmur; Tobacco use; S/P cervical spinal fusion; Former heavy tobacco smoker; Gout; Grade I diastolic dysfunction; LVH (left ventricular hypertrophy); Accelerated hypertension; Acute on chronic diastolic congestive heart failure (HCC); Class 2 obesity; Uncontrolled diabetes mellitus with hyperglycemia, with long-term current use of insulin  (HCC); Acute on chronic heart failure with preserved ejection fraction (HFpEF) (HCC); Hypertensive emergency; Acute respiratory failure with hypoxia (HCC); Elevated troponin; Pleuritic chest pain; Type 2 diabetes mellitus with peripheral neuropathy (HCC); Gastroesophageal reflux disease without esophagitis; COPD with asthma (HCC); Dyslipidemia; Unstable angina (HCC); Acute on chronic diastolic heart failure (HCC); Myocardial injury; Type II diabetes mellitus with renal manifestations (HCC); Obesity (BMI 30-39.9); HLD (hyperlipidemia); Chronic diastolic CHF (congestive heart failure) (HCC); Diarrhea; CAD (coronary artery disease); CKD (chronic kidney disease) stage 2, GFR 60-89 ml/min; Degenerative disc disease, cervical; Osteoarthritis of spine with radiculopathy, cervical region; History of colon polyps; COPD exacerbation (HCC); CHF (congestive heart failure) (HCC); Acute pulmonary edema (HCC); Nicotine  dependence; Class 3 obesity; Acute on chronic diastolic (congestive) heart failure (HCC); Chronic kidney disease, stage 3a (HCC); and Stable angina (HCC) on their problem list. he  has a past medical history of Bulging of cervical intervertebral disc, CAD (coronary artery  disease), Cervical myelopathy (HCC), Chronic heart failure with preserved ejection fraction (HFpEF) (HCC), CKD (chronic kidney disease), stage III (HCC), COPD (chronic obstructive pulmonary disease) (HCC), Gout, Hyperlipidemia LDL goal <70, Hypertension, Morbid obesity (HCC), OSA (obstructive sleep apnea), Stress due to family tension (04/15/2019), and Tracheobronchomalacia. he  has a past surgical history that includes Cholecystectomy; Colonoscopy with propofol  (N/A, 03/10/2020); RIGHT/LEFT HEART CATH AND CORONARY ANGIOGRAPHY (N/A, 05/29/2023); CORONARY PRESSURE/FFR STUDY (N/A, 05/29/2023); Cardiac catheterization; Back surgery; Colonoscopy with propofol  (N/A, 09/25/2023); polypectomy (09/25/2023); and RIGHT/LEFT HEART CATH AND CORONARY ANGIOGRAPHY (Bilateral, 10/30/2023) are also affecting patient's functional outcome.   REHAB POTENTIAL: Fair due to severity and complexity of condition and past experiences. Good rehab potential to improve to where he was at last PT discharge.   CLINICAL DECISION  MAKING: Evolving/moderate complexity  EVALUATION COMPLEXITY: Moderate   GOALS: Goals reviewed with patient? No  SHORT TERM GOALS: Target date: 06/02/2024  Patient will be independent with initial home exercise program for self-management of symptoms. Baseline: Initial HEP provided at IE (05/19/24); Goal status: MET   LONG TERM GOALS: Target date: 08/11/2024  Patient will be independent with a long-term home exercise program for self-management of symptoms.  Baseline: Initial HEP provided at IE (05/19/24); Goal status: In progress  2.  Patient will demonstrate improved Neck Disability Index (NDI) to equal or less than 10% to demonstrate improvement in overall condition and self-reported functional ability.  Baseline: 58% (05/19/24); Goal status: In progress  3.  Patient will demonstrate increased UE myotomes by equal or greater than 1/2 MMT or up to 4+/5 to improve his ability to use his B UE for  riding the tractor and completing household activities.  Baseline: as low as 3/5  (05/19/24); Goal status: In Progress  4.  Patient will demonstrate improved cervical spine rotation to 40 degrees bilaterally to maximize his use of the AA ROM and view his surroundings more easily.  Baseline: R/L 15/34 (05/19/24); Goal status: In progress  5.  Patient will demonstrate improvement in Patient Specific Functional Scale (PSFS) of equal or greater than 8/10 points to reflect clinically significant improvement in patient's most valued functional activities. Baseline: to be measured at visit 2 as appropriate (05/19/24); 3.3/10 (05/21/2024);  Goal status: In progress    PLAN:  PT FREQUENCY: 2x/week  PT DURATION: 8-12 weeks  PLANNED INTERVENTIONS: 97164- PT Re-evaluation, 97750- Physical Performance Testing, 97110-Therapeutic exercises, 97530- Therapeutic activity, 97112- Neuromuscular re-education, 97535- Self Care, [id number]- Manual therapy, [phone]- Aquatic Therapy, [phone]- Electrical stimulation (unattended), [phone] (1-2 muscles), [phone] (3+ muscles)- Dry Needling, Patient/Family education, Joint mobilization, Spinal mobilization, Cryotherapy, and Moist heat  PLAN FOR NEXT SESSION: update HEP as appropriate, educate on mechanical stressors and go through day to help with education on alleviation of these for the short term, exercise to improve intrascapular activation and strength for more balanced force couple and decrease over-reliance on UT, interventions to improve thoracic and AA/OA mobility. education   Camie SAUNDERS. Juli, PT, DPT, Cert. MDT 06/02/24, 11:38 AM  [facility] [address] [place], [place] [address] P: [phone] I F: [phone]

## 2024-06-02 NOTE — Telephone Encounter (Signed)
 Requested medication (s) are due for refill today: yes   Requested medication (s) are on the active medication list: yes   Last refill:  allopurinol - 05/17/24 , cymbalta - 11/11/23 #270 0 refills, zetia - 11/11/23 #90 0 refills, crestor - 11/11/23 #90 0 refills  Future visit scheduled: yes in 2 months  Notes to clinic:  allopurinol  last ordered by Ellouise Haber, MD. Do you want to refill Rx?  No refills remain on Rxs? Do you want to continue refills?     Requested Prescriptions  Pending Prescriptions Disp Refills   allopurinol  (ZYLOPRIM ) 100 MG tablet 30 tablet 5    Sig: Take 1 tablet (100 mg total) by mouth daily with the 300mg  tablet for a total daily dose of 400mg .     Endocrinology:  Gout Agents - allopurinol  Failed - 06/02/2024  9:52 AM      Failed - Uric Acid in normal range and within 360 days    Uric Acid, Serum  Date Value Ref Range Status  01/20/2024 9.3 (H) 3.7 - 8.6 mg/dL Final    Comment:    Performed at [facility], [address] [place], [place] [address]         Failed - Cr in normal range and within 360 days    Creat  Date Value Ref Range Status  02/10/2024 1.30 0.70 - 1.30 mg/dL Final   Creatinine, Ser  Date Value Ref Range Status  05/17/2024 1.47 (H) 0.61 - 1.24 mg/dL Final   Creatinine, Urine  Date Value Ref Range Status  05/29/2024 27 20 - 320 mg/dL Final         Passed - Valid encounter within last 12 months    Recent Outpatient Visits           4 days ago Uncontrolled diabetes mellitus with hyperglycemia, with long-term current use of insulin  [facility])   [facility] Leavy Mole, PA-C   2 weeks ago Allergic rash present on examination   [facility] [name], DO   3 months ago Hospital discharge follow-up   [facility] Leavy [facility], [facility]-C   3 months ago Exacerbation of persistent asthma, unspecified asthma severity   [facility]  Leavy Mole, PA-C   4 months ago Encounter for examination following treatment at hospital   [facility] Leavy Mole, [place]       Future Appointments             In 3 weeks Dunn, Bernardino HERO, PA-C [facility] [place]   In 1 month Dartha Ernst, MD [facility] Endocrinology   In 2 months Leavy Mole, PA-C [place] [facility]            Passed - CBC within normal limits and completed in the last 12 months    WBC  Date Value Ref Range Status  05/17/2024 11.0 (H) 4.0 - 10.5 K/uL Final   RBC  Date Value Ref Range Status  05/17/2024 5.22 4.22 - 5.81 MIL/uL Final   Hemoglobin  Date Value Ref Range Status  05/17/2024 15.0 13.0 - 17.0 g/dL Final  98/86/7974 82.7 13.0 - 17.7 g/dL Final   HCT  Date Value Ref Range Status  05/17/2024 44.7 39.0 - 52.0 % Final   Hematocrit  Date Value Ref Range Status  10/28/2023 51.6 (H) 37.5 - 51.0 % Final   MCHC  Date Value Ref Range Status  05/17/2024 33.6 30.0 - 36.0 g/dL Final   [facility]  Date Value Ref Range Status  05/17/2024 28.7 26.0 - 34.0 pg Final   MCV  Date Value Ref Range Status  05/17/2024 85.6 80.0 - 100.0 fL Final  10/28/2023 87 79 - 97 fL Final   No results found for: PLTCOUNTKUC, LABPLAT, POCPLA RDW  Date Value Ref Range Status  05/17/2024 13.6 11.5 - 15.5 % Final  10/28/2023 13.3 11.6 - 15.4 % Final          DULoxetine  (CYMBALTA ) 30 MG capsule 270 capsule 0    Sig: Take 2 capsules (60 mg total) by mouth every morning AND 1 capsule (30 mg total) every evening.     Psychiatry: Antidepressants - SNRI - duloxetine  Failed - 06/02/2024  9:52 AM      Failed - Cr in normal range and within 360 days    Creat  Date Value Ref Range Status  02/10/2024 1.30 0.70 - 1.30 mg/dL Final   Creatinine, Ser  Date Value Ref Range Status  05/17/2024 1.47 (H) 0.61 - 1.24 mg/dL Final   Creatinine, Urine  Date Value Ref Range Status  05/29/2024 27 20 -  320 mg/dL Final         Passed - eGFR is 30 or above and within 360 days    GFR, Est African American  Date Value Ref Range Status  09/30/2020 83 > OR = 60 mL/min/1.39m2 Final   GFR, Est Non African American  Date Value Ref Range Status  09/30/2020 71 > OR = 60 mL/min/1.57m2 Final   GFR, Estimated  Date Value Ref Range Status  05/17/2024 55 (L) >60 mL/min Final    Comment:    (NOTE) Calculated using the CKD-EPI Creatinine Equation (2021)    eGFR  Date Value Ref Range Status  03/17/2024 74 >59 mL/min/1.73 Final         Passed - Completed PHQ-2 or PHQ-9 in the last 360 days      Passed - Last BP in normal range    BP Readings from Last 1 Encounters:  05/29/24 138/82         Passed - Valid encounter within last 6 months    Recent Outpatient Visits           4 days ago Uncontrolled diabetes mellitus with hyperglycemia, with long-term current use of insulin  [facility])   [facility] Leavy Mole, PA-C   2 weeks ago Allergic rash present on examination   [facility] [name], DO   3 months ago Hospital discharge follow-up   [facility] Leavy Mole, [facility]-C   3 months ago Exacerbation of persistent asthma, unspecified asthma severity   [place] [facility] Leavy Mole, PA-C   4 months ago Encounter for examination following treatment at hospital   [facility] Leavy Mole, [place]       Future Appointments             In 3 weeks Dunn, Bernardino HERO, PA-C [place] [place]   In 1 month Dartha Ernst, MD [facility] Endocrinology   In 2 months Leavy Mole, PA-C [place] [facility]             ezetimibe  (ZETIA ) 10 MG tablet 90 tablet 0    Sig: Take 1 tablet (10 mg total) by mouth daily.     Cardiovascular:  Antilipid - Sterol  Transport Inhibitors Failed - 06/02/2024  9:52 AM      Failed - Lipid  Panel in normal range within the last 12 months    Cholesterol, Total  Date Value Ref Range Status  11/15/2023 178 100 - 199 mg/dL Final   Cholesterol  Date Value Ref Range Status  04/21/2024 193 0 - 200 mg/dL Final   LDL Cholesterol (Calc)  Date Value Ref Range Status  08/09/2023  mg/dL (calc) Final    Comment:    . LDL cholesterol not calculated. Triglyceride levels greater than 400 mg/dL invalidate calculated LDL results. . Reference range: <100 . Desirable range <100 mg/dL for primary prevention;   <70 mg/dL for patients with CHD or diabetic patients  with > or = 2 CHD risk factors. SABRA LDL-C is now calculated using the Martin-Hopkins  calculation, which is a validated novel method providing  better accuracy than the Friedewald equation in the  estimation of LDL-C.  Gladis APPLETHWAITE et al. SANDREA. 7986;689(80): 2061-2068  (http://education.[employer].com/faq/FAQ164)    LDL Chol Calc (NIH)  Date Value Ref Range Status  11/15/2023 101 (H) 0 - 99 mg/dL Final   LDL Cholesterol  Date Value Ref Range Status  04/21/2024 83 0 - 99 mg/dL Final    Comment:           Total Cholesterol/HDL:CHD Risk Coronary Heart Disease Risk Table                     Men   Women  1/2 Average Risk   3.4   3.3  Average Risk       5.0   4.4  2 X Average Risk   9.6   7.1  3 X Average Risk  23.4   11.0        Use the calculated Patient Ratio above and the CHD Risk Table to determine the patient's CHD Risk.        ATP III CLASSIFICATION (LDL):  <100     mg/dL   Optimal  899-870  mg/dL   Near or Above                    Optimal  130-159  mg/dL   Borderline  839-810  mg/dL   High  >809     mg/dL   Very High Performed at [facility], [address] [place], [place] [address]    Direct LDL  Date Value Ref Range Status  08/19/2023 147 (H) 0 - 99 mg/dL Final    Comment:    Performed at [facility], [address], [place], [place] [address]   LDL Direct  Date Value Ref  Range Status  11/15/2023 106 (H) 0 - 99 mg/dL Final   HDL  Date Value Ref Range Status  04/21/2024 47 >40 mg/dL Final  98/68/7974 41 >60 mg/dL Final   Triglycerides  Date Value Ref Range Status  04/21/2024 314 (H) <150 mg/dL Final         Passed - AST in normal range and within 360 days    AST  Date Value Ref Range Status  04/12/2024 23 15 - 41 U/L Final         Passed - ALT in normal range and within 360 days    ALT  Date Value Ref Range Status  04/12/2024 19 0 - 44 U/L Final         Passed - Patient is not pregnant  Passed - Valid encounter within last 12 months    Recent Outpatient Visits           4 days ago Uncontrolled diabetes mellitus with hyperglycemia, with long-term current use of insulin  [facility])   [facility] Leavy Mole, PA-C   2 weeks ago Allergic rash present on examination   [facility] [name], DO   3 months ago Hospital discharge follow-up   [facility] Leavy Mole, PA-C   3 months ago Exacerbation of persistent asthma, unspecified asthma severity   [place] [facility] Leavy Mole, PA-C   4 months ago Encounter for examination following treatment at hospital   [facility] Leavy Mole, [place]       Future Appointments             In 3 weeks Dunn, Bernardino HERO, PA-C [place] [facility] [place]   In 1 month Dartha Ernst, MD [facility] Endocrinology   In 2 months Leavy Mole, PA-C [facility]             rosuvastatin  (CRESTOR ) 40 MG tablet 90 tablet 0    Sig: Take 1 tablet (40 mg total) by mouth daily.     Cardiovascular:  Antilipid - Statins 2 Failed - 06/02/2024  9:52 AM      Failed - Cr in normal range and within 360 days    Creat  Date Value Ref Range Status  02/10/2024 1.30 0.70 - 1.30 mg/dL Final   Creatinine, Ser  Date Value Ref Range Status   05/17/2024 1.47 (H) 0.61 - 1.24 mg/dL Final   Creatinine, Urine  Date Value Ref Range Status  05/29/2024 27 20 - 320 mg/dL Final         Failed - Lipid Panel in normal range within the last 12 months    Cholesterol, Total  Date Value Ref Range Status  11/15/2023 178 100 - 199 mg/dL Final   Cholesterol  Date Value Ref Range Status  04/21/2024 193 0 - 200 mg/dL Final   LDL Cholesterol (Calc)  Date Value Ref Range Status  08/09/2023  mg/dL (calc) Final    Comment:    . LDL cholesterol not calculated. Triglyceride levels greater than 400 mg/dL invalidate calculated LDL results. . Reference range: <100 . Desirable range <100 mg/dL for primary prevention;   <70 mg/dL for patients with CHD or diabetic patients  with > or = 2 CHD risk factors. SABRA LDL-C is now calculated using the Martin-Hopkins  calculation, which is a validated novel method providing  better accuracy than the Friedewald equation in the  estimation of LDL-C.  Gladis APPLETHWAITE et al. SANDREA. 7986;689(80): 2061-2068  (http://education.[facility].com/faq/FAQ164)    LDL Chol Calc (NIH)  Date Value Ref Range Status  11/15/2023 101 (H) 0 - 99 mg/dL Final   LDL Cholesterol  Date Value Ref Range Status  04/21/2024 83 0 - 99 mg/dL Final    Comment:           Total Cholesterol/HDL:CHD Risk Coronary Heart Disease Risk Table                     Men   Women  1/2 Average Risk   3.4   3.3  Average Risk       5.0   4.4  2 X Average Risk   9.6   7.1  3 X Average  Risk  23.4   11.0        Use the calculated Patient Ratio above and the CHD Risk Table to determine the patient's CHD Risk.        ATP III CLASSIFICATION (LDL):  <100     mg/dL   Optimal  899-870  mg/dL   Near or Above                    Optimal  130-159  mg/dL   Borderline  839-810  mg/dL   High  >809     mg/dL   Very High Performed at [facility], [address] [place], [place] [address]    Direct LDL  Date Value Ref Range Status   08/19/2023 147 (H) 0 - 99 mg/dL Final    Comment:    Performed at [facility], [address], [place], [place] [address]   LDL Direct  Date Value Ref Range Status  11/15/2023 106 (H) 0 - 99 mg/dL Final   HDL  Date Value Ref Range Status  04/21/2024 47 >40 mg/dL Final  98/68/7974 41 >60 mg/dL Final   Triglycerides  Date Value Ref Range Status  04/21/2024 314 (H) <150 mg/dL Final         Passed - Patient is not pregnant      Passed - Valid encounter within last 12 months    Recent Outpatient Visits           4 days ago Uncontrolled diabetes mellitus with hyperglycemia, with long-term current use of insulin  [facility])   [place] [place] [facility] Leavy Mole, PA-C   2 weeks ago Allergic rash present on examination   [facility] [name], [place]   3 months ago Hospital discharge follow-up   [facility] Leavy Mole, PA-C   3 months ago Exacerbation of persistent asthma, unspecified asthma severity   [place] [facility] Leavy Mole, PA-C   4 months ago Encounter for examination following treatment at hospital   [facility] Leavy Mole, [place]       Future Appointments             In 3 weeks Dunn, Bernardino HERO, PA-C [place] [facility] [place]   In 1 month Dartha Ernst, MD [facility] Endocrinology   In 2 months Leavy Mole, PA-C [place] [facility]            Refused Prescriptions Disp Refills   JARDIANCE  10 MG TABS tablet [Pharmacy Med Name: empagliflozin  (JARDIANCE ) 10 MG Tab tablet] 90 tablet 1    Sig: Take 1 tablet (10 mg total) by mouth daily.     Endocrinology:  Diabetes - SGLT2 Inhibitors Failed - 06/02/2024  9:52 AM      Failed - Cr in normal range and within 360 days    Creat  Date Value Ref Range Status  02/10/2024 1.30 0.70 - 1.30 mg/dL Final   Creatinine, Ser  Date Value Ref Range Status   05/17/2024 1.47 (H) 0.61 - 1.24 mg/dL Final   Creatinine, Urine  Date Value Ref Range Status  05/29/2024 27 20 - 320 mg/dL Final         Failed - HBA1C is between 0 and 7.9 and within 180 days    Hemoglobin A1C  Date Value Ref Range Status  11/28/2023 7.9 (A) 4.0 - 5.6 % Final  01/02/2022 6.5  Final   Hgb A1c MFr Bld  Date Value Ref Range Status  08/09/2023 9.8 (H) <5.7 % of total Hgb Final    Comment:    For someone without known diabetes, a hemoglobin A1c value of 6.5% or greater indicates that they may have  diabetes and this should be confirmed with a follow-up  test. . For someone with known diabetes, a value <7% indicates  that their diabetes is well controlled and a value  greater than or equal to 7% indicates suboptimal  control. A1c targets should be individualized based on  duration of diabetes, age, comorbid conditions, and  other considerations. . Currently, no consensus exists regarding use of hemoglobin A1c for diagnosis of diabetes for children. .          Failed - eGFR in normal range and within 360 days    GFR, Est African American  Date Value Ref Range Status  09/30/2020 83 > OR = 60 mL/min/1.60m2 Final   GFR, Est Non African American  Date Value Ref Range Status  09/30/2020 71 > OR = 60 mL/min/1.53m2 Final   GFR, Estimated  Date Value Ref Range Status  05/17/2024 55 (L) >60 mL/min Final    Comment:    (NOTE) Calculated using the CKD-EPI Creatinine Equation (2021)    eGFR  Date Value Ref Range Status  03/17/2024 74 >59 mL/min/1.73 Final         Passed - Valid encounter within last 6 months    Recent Outpatient Visits           4 days ago Uncontrolled diabetes mellitus with hyperglycemia, with long-term current use of insulin  [facility])   [facility] Leavy Mole, PA-C   2 weeks ago Allergic rash present on examination   [facility] [name], [facility]   3 months ago Hospital  discharge follow-up   [facility] Leavy Mole, PA-C   3 months ago Exacerbation of persistent asthma, unspecified asthma severity   [facility] Leavy Mole, PA-C   4 months ago Encounter for examination following treatment at hospital   [facility] Leavy Mole, [place]       Future Appointments             In 3 weeks Dunn, Bernardino HERO, PA-C [place] [facility] [place]   In 1 month Dartha Ernst, MD [facility] Endocrinology   In 2 months Leavy Mole, PA-C [facility]

## 2024-06-03 ENCOUNTER — Ambulatory Visit: Payer: Self-pay | Admitting: Family Medicine

## 2024-06-03 ENCOUNTER — Other Ambulatory Visit: Payer: Self-pay

## 2024-06-04 ENCOUNTER — Ambulatory Visit: Admitting: Physical Therapy

## 2024-06-04 ENCOUNTER — Other Ambulatory Visit: Payer: Self-pay | Admitting: Family Medicine

## 2024-06-04 ENCOUNTER — Emergency Department
Admission: EM | Admit: 2024-06-04 | Discharge: 2024-06-04 | Disposition: A | Discharge status: Home / Self Care | Attending: Emergency Medicine | Admitting: Emergency Medicine

## 2024-06-04 ENCOUNTER — Other Ambulatory Visit: Payer: Self-pay

## 2024-06-04 ENCOUNTER — Encounter: Payer: Self-pay | Admitting: Family Medicine

## 2024-06-04 ENCOUNTER — Emergency Department

## 2024-06-04 ENCOUNTER — Ambulatory Visit: Payer: Self-pay

## 2024-06-04 DIAGNOSIS — R1013 Epigastric pain: Secondary | ICD-10-CM | POA: Insufficient documentation

## 2024-06-04 DIAGNOSIS — E1165 Type 2 diabetes mellitus with hyperglycemia: Secondary | ICD-10-CM

## 2024-06-04 DIAGNOSIS — K5730 Diverticulosis of large intestine without perforation or abscess without bleeding: Secondary | ICD-10-CM | POA: Diagnosis not present

## 2024-06-04 DIAGNOSIS — R112 Nausea with vomiting, unspecified: Secondary | ICD-10-CM | POA: Diagnosis not present

## 2024-06-04 DIAGNOSIS — K859 Acute pancreatitis without necrosis or infection, unspecified: Secondary | ICD-10-CM | POA: Diagnosis not present

## 2024-06-04 DIAGNOSIS — I10 Essential (primary) hypertension: Secondary | ICD-10-CM | POA: Insufficient documentation

## 2024-06-04 LAB — CBC
HCT: 49 % (ref 39.0–52.0)
Hemoglobin: 17.1 g/dL — ABNORMAL HIGH (ref 13.0–17.0)
MCH: 29 pg (ref 26.0–34.0)
MCHC: 34.9 g/dL (ref 30.0–36.0)
MCV: 83.1 fL (ref 80.0–100.0)
Platelets: 199 K/uL (ref 150–400)
RBC: 5.9 MIL/uL — ABNORMAL HIGH (ref 4.22–5.81)
RDW: 13.2 % (ref 11.5–15.5)
WBC: 10.3 K/uL (ref 4.0–10.5)
nRBC: 0 % (ref 0.0–0.2)

## 2024-06-04 LAB — COMPREHENSIVE METABOLIC PANEL WITH GFR
ALT: 20 U/L (ref 0–44)
AST: 26 U/L (ref 15–41)
Albumin: 3.8 g/dL (ref 3.5–5.0)
Alkaline Phosphatase: 65 U/L (ref 38–126)
Anion gap: 15 (ref 5–15)
BUN: 13 mg/dL (ref 6–20)
CO2: 23 mmol/L (ref 22–32)
Calcium: 9.3 mg/dL (ref 8.9–10.3)
Chloride: 97 mmol/L — ABNORMAL LOW (ref 98–111)
Creatinine, Ser: 1.23 mg/dL (ref 0.61–1.24)
GFR, Estimated: >60 mL/min (ref >60)
Glucose, Bld: 221 mg/dL — ABNORMAL HIGH (ref 70–99)
Potassium: 3.5 mmol/L (ref 3.5–5.1)
Sodium: 135 mmol/L (ref 135–145)
Total Bilirubin: 1 mg/dL (ref 0.0–1.2)
Total Protein: 7.3 g/dL (ref 6.5–8.1)

## 2024-06-04 LAB — URINALYSIS, ROUTINE W REFLEX MICROSCOPIC
Bacteria, UA: NONE SEEN
Bilirubin Urine: NEGATIVE
Glucose, UA: >=500 mg/dL — AB
Hgb urine dipstick: NEGATIVE
Ketones, ur: NEGATIVE mg/dL
Leukocytes,Ua: NEGATIVE
Nitrite: NEGATIVE
Protein, ur: >=300 mg/dL — AB
Specific Gravity, Urine: >1.046 — ABNORMAL HIGH (ref 1.005–1.030)
pH: 5 (ref 5.0–8.0)

## 2024-06-04 LAB — LIPASE, BLOOD: Lipase: 58 U/L — ABNORMAL HIGH (ref 11–51)

## 2024-06-04 LAB — CBG MONITORING, ED: Glucose-Capillary: 216 mg/dL — ABNORMAL HIGH (ref 70–99)

## 2024-06-04 MED ORDER — PANTOPRAZOLE SODIUM 40 MG PO TBEC
40.0000 mg | DELAYED_RELEASE_TABLET | Freq: Every day | ORAL | 1 refills | Status: DC
  Filled 2024-06-04: qty 30, 30d supply, fill #0
  Filled 2024-07-09 – 2024-07-14 (×2): qty 30, 30d supply, fill #1

## 2024-06-04 MED ORDER — INSULIN LISPRO (1 UNIT DIAL) 100 UNIT/ML (KWIKPEN)
10.0000 [IU] | PEN_INJECTOR | Freq: Three times a day (TID) | SUBCUTANEOUS | 1 refills | Status: AC
  Filled 2024-06-04: qty 27, 90d supply, fill #0

## 2024-06-04 MED ORDER — LACTATED RINGERS IV BOLUS
1000.0000 mL | Freq: Once | INTRAVENOUS | Status: AC
  Administered 2024-06-04: 1000 mL via INTRAVENOUS

## 2024-06-04 MED ORDER — SUCRALFATE 1 G PO TABS
1.0000 g | ORAL_TABLET | Freq: Three times a day (TID) | ORAL | 0 refills | Status: AC
  Filled 2024-06-04: qty 120, 30d supply, fill #0

## 2024-06-04 MED ORDER — IOHEXOL 300 MG/ML  SOLN
100.0000 mL | Freq: Once | INTRAMUSCULAR | Status: AC | PRN
  Administered 2024-06-04: 100 mL via INTRAVENOUS

## 2024-06-04 NOTE — ED Provider Notes (Signed)
 [facility] Provider Note   Event Date/Time   First MD Initiated Contact with Patient 06/04/24 1743     (approximate) History  Dizziness  HPI Cory Rivas is a 58 y.o. male with a stated past medical history of hypertension, hypercholesterolemia, and cholecystectomy who presents complaining of intermittent midepigastric abdominal pain with associated nausea/vomiting over the last 2 days.  Patient states that he has also been feeling lightheaded over this time.  Patient states that he has had pancreatitis in the past however does not feel as bad as when he was diagnosed before.  Patient denies any recent travel, sick contacts, or food at urinary.  Patient denies any exacerbating relieving factors for this pain.  Patient does state that he has a history of reflux and feels that this may be contributing as well. ROS: Patient currently denies any vision changes, tinnitus, difficulty speaking, facial droop, sore throat, chest pain, shortness of breath, nausea/vomiting/diarrhea, dysuria, or weakness/numbness/paresthesias in any extremity   Physical Exam  Triage Vital Signs: ED Triage Vitals [06/04/24 1528]  Encounter Vitals Group     BP (!) 172/122     Girls Systolic BP Percentile      Girls Diastolic BP Percentile      Boys Systolic BP Percentile      Boys Diastolic BP Percentile      Pulse Rate 92     Resp 18     Temp 98.1 F (36.7 C)     Temp Source Oral     SpO2 98 %     Weight      Height      Head Circumference      Peak Flow      Pain Score 7     Pain Loc      Pain Education      Exclude from Growth Chart    Most recent vital signs: Vitals:   06/04/24 1930 06/04/24 2030  BP: (!) 185/115 (!) 169/104  Pulse: 65 89  Resp: 19   Temp:    SpO2: 100% 99%   General: Awake, oriented x4. CV:  Good peripheral perfusion. Resp:  Normal effort. Abd:  No distention.  Mild tenderness to palpation in the epigastric region Other:  Middle-aged obese  Caucasian male resting comfortably in no acute distress ED Results / Procedures / Treatments  Labs (all labs ordered are listed, but only abnormal results are displayed) Labs Reviewed  COMPREHENSIVE METABOLIC PANEL WITH GFR - Abnormal; Notable for the following components:      Result Value   Chloride 97 (*)    Glucose, Bld 221 (*)    All other components within normal limits  CBC - Abnormal; Notable for the following components:   RBC 5.90 (*)    Hemoglobin 17.1 (*)    All other components within normal limits  URINALYSIS, ROUTINE W REFLEX MICROSCOPIC - Abnormal; Notable for the following components:   Color, Urine YELLOW (*)    APPearance CLEAR (*)    Specific Gravity, Urine >1.046 (*)    Glucose, UA >=500 (*)    Protein, ur >=300 (*)    All other components within normal limits  LIPASE, BLOOD - Abnormal; Notable for the following components:   Lipase 58 (*)    All other components within normal limits  CBG MONITORING, ED - Abnormal; Notable for the following components:   Glucose-Capillary 216 (*)    All other components within normal limits   EKG ED ECG REPORT  I, Artist MARLA Kerns, the attending physician, personally viewed and interpreted this ECG. Date: 06/04/2024 EKG Time: 1532 Rate: 80 Rhythm: normal sinus rhythm QRS Axis: normal Intervals: normal ST/T Wave abnormalities: normal Narrative Interpretation: no evidence of acute ischemia RADIOLOGY ED MD interpretation: CT of the abdomen pelvis with IV contrast shows no evidence of acute abnormalities. - All radiology independently interpreted and agree with radiology assessment Official radiology report(s): CT ABDOMEN PELVIS W CONTRAST Result Date: 06/04/2024 CLINICAL DATA:  Pancreatitis, acute, severe. Epigastric pain, nausea, vomiting EXAM: CT ABDOMEN AND PELVIS WITH CONTRAST TECHNIQUE: Multidetector CT imaging of the abdomen and pelvis was performed using the standard protocol following bolus administration of  intravenous contrast. RADIATION DOSE REDUCTION: This exam was performed according to the departmental dose-optimization program which includes automated exposure control, adjustment of the mA and/or kV according to patient size and/or use of iterative reconstruction technique. CONTRAST:  OMNIPAQUE  IOHEXOL  300 MG/ML  SOLN COMPARISON:  12/29/2023 FINDINGS: Lower chest: No acute abnormality Hepatobiliary: Prior cholecystectomy. Diffuse fatty infiltration of the liver. No focal hepatic abnormality. Pancreas: No focal abnormality or ductal dilatation. No surrounding inflammation. Spleen: No focal abnormality.  Normal size. Adrenals/Urinary Tract: No adrenal abnormality. No focal renal abnormality. No stones or hydronephrosis. Urinary bladder is unremarkable. Stomach/Bowel: Sigmoid diverticulosis. No active diverticulitis. Stomach and small bowel decompressed, unremarkable. No obstruction or inflammatory process. Vascular/Lymphatic: No evidence of aneurysm or adenopathy. Scattered aortic atherosclerosis. Reproductive: No visible focal abnormality. Other: No free fluid or free air. Musculoskeletal: No acute bony abnormality. IMPRESSION: No evidence of acute pancreatitis. Hepatic steatosis. Scattered aortic atherosclerosis. Sigmoid diverticulosis. Electronically Signed   By: Franky Crease M.D.   On: 06/04/2024 19:28   PROCEDURES: Critical Care performed: No Procedures MEDICATIONS ORDERED IN ED: Medications  lactated ringers  bolus 1,000 mL (1,000 mLs Intravenous New Bag/Given 06/04/24 1822)  iohexol  (OMNIPAQUE ) 300 MG/ML solution 100 mL (100 mLs Intravenous Contrast Given 06/04/24 1857)   IMPRESSION / MDM / ASSESSMENT AND PLAN / ED COURSE  I reviewed the triage vital signs and the nursing notes.                             The patient is on the cardiac monitor to evaluate for evidence of arrhythmia and/or significant heart rate changes. Patient's presentation is most consistent with acute presentation with  potential threat to life or bodily function. Patient is a 58 year old male that presents for midepigastric abdominal pain with associated nausea/vomiting has been intermittent over the last 2 days DDx: Pancreatitis, gastroenteritis, perforated peptic ulcer, food poisoning Plan: CBC, CMP, lipase, UA CBC showing hemoglobin of 17, CMP showing glucose 221, lipase elevated to 58 Given elevated lipase, will obtain CT of the abdomen and pelvis with IV contrast as well as fluid resuscitation as this elevation in hemoglobin may be due to volume contraction. Clinical Course as of 06/04/24 2108  Thu Jun 04, 2024  2107 CT results reviewed with patient showing no evidence of acute abnormalities at this time.  Discussed with patient the possibility of this being reflux related and patient does state that he had a significant amount of belching with gas prior to the symptoms beginning and believes that this may be what is caused his abdominal discomfort.  Patient states that he is not on any medications for GERD at this time therefore I will start patient on Protonix  and sucralfate  as well as instructions to follow-up with his primary care physician for further evaluation  and management.  Patient expresses understanding and all questions were answered prior to discharge. Dispo: Discharge home with PCP follow-up [EB]    Clinical Course User Index [EB] Jossie Artist POUR, MD   FINAL CLINICAL IMPRESSION(S) / ED DIAGNOSES   Final diagnoses:  Epigastric pain   Rx / DC Orders   ED Discharge Orders          Ordered    pantoprazole  (PROTONIX ) 40 MG tablet  Daily        06/04/24 2104    sucralfate  (CARAFATE ) 1 g tablet  3 times daily with meals & bedtime        06/04/24 2104           Note:  This document was prepared using Dragon voice recognition software and may include unintentional dictation errors.   Jossie Artist POUR, MD 06/04/24 2108

## 2024-06-04 NOTE — ED Notes (Signed)
 Pt given a urinal to collect a urine sample. Warm blanket given. Call light in reach

## 2024-06-04 NOTE — Telephone Encounter (Signed)
 FYI Only or Action Required?: FYI only for provider.  Patient was last seen in primary care on 05/15/2024 by [name], DO.  Called Nurse Triage reporting Dizziness/abd pain/vomiting.  Symptoms began yesterday.  Interventions attempted: Rest, hydration, or home remedies.  Symptoms are: gradually improving.  Triage Disposition: See HCP Within 4 Hours (Or PCP Triage)  Patient/caregiver understands and will follow disposition?: Yes          Copied from CRM #[id number]. Topic: Clinical - Red Word Triage >> Jun 04, 2024  2:13 PM Antwanette L wrote: Red Word that prompted transfer to Nurse Triage: Pt is experiencing dizziness and vomiting. Pt has been having the symptoms since last night Reason for Disposition  [1] MILD-MODERATE pain AND [2] constant AND [3] present > 2 hours  Answer Assessment - Initial Assessment Questions 1. DESCRIPTION: Describe your dizziness.     Off balance, cannot walk straight  2. LIGHTHEADED: Do you feel lightheaded? (e.g., somewhat faint, woozy, weak upon standing)     Yesterday started 3. VERTIGO: Do you feel like either you or the room is spinning or tilting? (i.e., vertigo)     No ataxia  4. SEVERITY: How bad is it?  Do you feel like you are going to faint? Can you stand and walk?     Yes but is off balance  5. ONSET:  When did the dizziness begin?     Yesterday  6. AGGRAVATING FACTORS: Does anything make it worse? (e.g., standing, change in head position)     *No Answer* 7. HEART RATE: Can you tell me your heart rate? How many beats in 15 seconds?  (Note: Not all patients can do this.)       *No Answer* 8. CAUSE: What do you think is causing the dizziness? (e.g., decreased fluids or food, diarrhea, emotional distress, heat exposure, new medicine, sudden standing, vomiting; unknown)vomitng      10. OTHER SYMPTOMS: Do you have any other symptoms? (e.g., fever, chest pain, vomiting, diarrhea, bleeding)       Vomiting  at 1030, soreness front of throat from vomiting (vomited 2 x)  Has taken 2 water bottle 1 diet, tongue dry/ stated sugar was highthink  150 158 mg dl// abdominal pain 4/89  Answer Assessment - Initial Assessment Questions 1. LOCATION: Where does it hurt?      Between abdomen and belly  2. RADIATION: Does the pain shoot anywhere else? (e.g., chest, back)     no 3. ONSET: When did the pain begin? (Minutes, hours or days ago)      2 days ago   4. SUDDEN: Gradual or sudden onset?     *No Answer* 5. PATTERN Does the pain come and go, or is it constant?     constant 6. SEVERITY: How bad is the pain?  (e.g., Scale 1-10; mild, moderate, or severe)     5/10 7. RECURRENT SYMPTOM: Have you ever had this type of stomach pain before? If Yes, ask: When was the last time? and What happened that time?      Not had this type of pain before  8. CAUSE: What do you think is causing the stomach pain? (e.g., gallstones, recent abdominal surgery)     ulcer 10. OTHER SYMPTOMS: Do you have any other symptoms? (e.g., back pain, diarrhea, fever, urination pain, vomiting)       Vomiting/dizziness/burping  Protocols used: Dizziness - Lightheadedness-A-AH, Abdominal Pain - Male-A-AH

## 2024-06-04 NOTE — ED Triage Notes (Signed)
 Pt to ED via POV from home. Pt reports dizziness, epigastric pain, N/V that started yesterday. Pt reports increased thirst. Pt with hx of DM, CHF

## 2024-06-04 NOTE — ED Notes (Signed)
Pt received a warm blanket

## 2024-06-05 ENCOUNTER — Other Ambulatory Visit: Payer: Self-pay

## 2024-06-05 ENCOUNTER — Other Ambulatory Visit (HOSPITAL_COMMUNITY): Payer: Self-pay

## 2024-06-05 ENCOUNTER — Telehealth: Payer: Self-pay | Admitting: Pharmacy Technician

## 2024-06-05 MED FILL — Continuous Glucose System Sensor: 28 days supply | Qty: 2 | Fill #0 | Status: AC

## 2024-06-05 NOTE — Telephone Encounter (Signed)
 Requested medication (s) are due for refill today - unsure  Requested medication (s) are on the active medication list -no  Future visit scheduled -no  Last refill: unsure  Notes to clinic: supply no longer listed on current medication list- sent for provider review    Requested Prescriptions  Pending Prescriptions Disp Refills   Continuous Glucose Sensor (FREESTYLE LIBRE 3 SENSOR) MISC [Pharmacy Med Name: Continuous Glucose Sensor (FREESTYLE LIBRE 3 SENSOR) Misc] 6 each 2    Sig: Place 1 sensor onto skin every 14 (fourteen) days to monitor blood sugar continuously     Endocrinology: Diabetes - Testing Supplies Passed - 06/05/2024  1:42 PM      Passed - Valid encounter within last 12 months    Recent Outpatient Visits           1 week ago Uncontrolled diabetes mellitus with hyperglycemia, with long-term current use of insulin  [facility])   [facility] Leavy Mole, PA-C   3 weeks ago Allergic rash present on examination   [facility] [name], DO   3 months ago Hospital discharge follow-up   [facility] Leavy Mole, PA-C   3 months ago Exacerbation of persistent asthma, unspecified asthma severity   [place] [facility] Leavy Mole, PA-C   4 months ago Encounter for examination following treatment at hospital   [facility] Leavy Mole, [place]       Future Appointments             In 2 weeks Dunn, Bernardino HERO, PA-C [facility] [place]   In 1 month Dartha Ernst, MD [facility] Endocrinology   In 2 months Leavy Mole, PA-C [facility]               Requested Prescriptions  Pending Prescriptions Disp Refills   Continuous Glucose Sensor (FREESTYLE LIBRE 3 SENSOR) MISC [Pharmacy Med Name: Continuous Glucose Sensor (FREESTYLE LIBRE 3 SENSOR) Misc] 6 each 2    Sig: Place 1 sensor onto skin  every 14 (fourteen) days to monitor blood sugar continuously     Endocrinology: Diabetes - Testing Supplies Passed - 06/05/2024  1:42 PM      Passed - Valid encounter within last 12 months    Recent Outpatient Visits           1 week ago Uncontrolled diabetes mellitus with hyperglycemia, with long-term current use of insulin  [facility])   [place] [facility] Leavy Mole, PA-C   3 weeks ago Allergic rash present on examination   [facility] [name], DO   3 months ago Hospital discharge follow-up   [facility] Leavy Mole, PA-C   3 months ago Exacerbation of persistent asthma, unspecified asthma severity   [facility] Leavy Mole, PA-C   4 months ago Encounter for examination following treatment at hospital   [facility] Leavy Mole, [place]       Future Appointments             In 2 weeks Dunn, Bernardino HERO, PA-C [facility] [place]   In 1 month Dartha Ernst, MD [facility] Endocrinology   In 2 months Leavy Mole, PA-C [facility]

## 2024-06-05 NOTE — Telephone Encounter (Signed)
 Pharmacy Patient Advocate Encounter   Received notification from CoverMyMeds that prior authorization for FreeStyle Libre 3 Plus Sensor  is required/requested.   Insurance verification completed.   The patient is insured through [employer] .   Per test claim: PA required; PA submitted to above mentioned insurance via Latent Key/confirmation #/EOC [id number] Status is pending

## 2024-06-05 NOTE — Telephone Encounter (Signed)
 Pharmacy Patient Advocate Encounter  Received notification from [employer] that Prior Authorization for FreeStyle Libre 3 Plus Sensor  has been APPROVED from 06/05/24 to 06/05/25   PA #/Case ID/Reference #: [id number]

## 2024-06-07 ENCOUNTER — Other Ambulatory Visit: Payer: Self-pay

## 2024-06-07 DIAGNOSIS — R079 Chest pain, unspecified: Principal | ICD-10-CM | POA: Insufficient documentation

## 2024-06-07 DIAGNOSIS — I130 Hypertensive heart and chronic kidney disease with heart failure and stage 1 through stage 4 chronic kidney disease, or unspecified chronic kidney disease: Secondary | ICD-10-CM | POA: Insufficient documentation

## 2024-06-07 DIAGNOSIS — Z79899 Other long term (current) drug therapy: Secondary | ICD-10-CM | POA: Diagnosis not present

## 2024-06-07 DIAGNOSIS — M79602 Pain in left arm: Secondary | ICD-10-CM | POA: Diagnosis not present

## 2024-06-07 DIAGNOSIS — I5033 Acute on chronic diastolic (congestive) heart failure: Secondary | ICD-10-CM | POA: Diagnosis not present

## 2024-06-07 DIAGNOSIS — Z794 Long term (current) use of insulin: Secondary | ICD-10-CM | POA: Diagnosis not present

## 2024-06-07 DIAGNOSIS — M549 Dorsalgia, unspecified: Secondary | ICD-10-CM | POA: Diagnosis not present

## 2024-06-07 DIAGNOSIS — I700 Atherosclerosis of aorta: Secondary | ICD-10-CM | POA: Diagnosis not present

## 2024-06-07 DIAGNOSIS — I5032 Chronic diastolic (congestive) heart failure: Secondary | ICD-10-CM | POA: Insufficient documentation

## 2024-06-07 DIAGNOSIS — K5730 Diverticulosis of large intestine without perforation or abscess without bleeding: Secondary | ICD-10-CM | POA: Diagnosis not present

## 2024-06-07 DIAGNOSIS — Z7982 Long term (current) use of aspirin: Secondary | ICD-10-CM | POA: Diagnosis not present

## 2024-06-07 DIAGNOSIS — E1142 Type 2 diabetes mellitus with diabetic polyneuropathy: Secondary | ICD-10-CM | POA: Diagnosis not present

## 2024-06-07 DIAGNOSIS — I214 Non-ST elevation (NSTEMI) myocardial infarction: Secondary | ICD-10-CM | POA: Diagnosis not present

## 2024-06-07 DIAGNOSIS — E785 Hyperlipidemia, unspecified: Secondary | ICD-10-CM | POA: Insufficient documentation

## 2024-06-07 DIAGNOSIS — I160 Hypertensive urgency: Secondary | ICD-10-CM | POA: Diagnosis not present

## 2024-06-07 DIAGNOSIS — J449 Chronic obstructive pulmonary disease, unspecified: Secondary | ICD-10-CM | POA: Diagnosis not present

## 2024-06-07 DIAGNOSIS — R0789 Other chest pain: Secondary | ICD-10-CM | POA: Diagnosis not present

## 2024-06-07 DIAGNOSIS — I2510 Atherosclerotic heart disease of native coronary artery without angina pectoris: Secondary | ICD-10-CM | POA: Insufficient documentation

## 2024-06-07 DIAGNOSIS — N1831 Chronic kidney disease, stage 3a: Secondary | ICD-10-CM | POA: Insufficient documentation

## 2024-06-07 DIAGNOSIS — F17210 Nicotine dependence, cigarettes, uncomplicated: Secondary | ICD-10-CM | POA: Diagnosis not present

## 2024-06-07 DIAGNOSIS — M109 Gout, unspecified: Secondary | ICD-10-CM | POA: Insufficient documentation

## 2024-06-07 DIAGNOSIS — N3289 Other specified disorders of bladder: Secondary | ICD-10-CM | POA: Diagnosis not present

## 2024-06-07 NOTE — ED Triage Notes (Signed)
 Patient reports sudden onset of chest pain described as pressure and sharp. The pain is centralized and radiates to back and left arm. Associated with nausea, shortness of breath, and diaphoresis.

## 2024-06-08 ENCOUNTER — Emergency Department

## 2024-06-08 ENCOUNTER — Observation Stay
Admission: EM | Admit: 2024-06-08 | Discharge: 2024-06-09 | Disposition: A | Discharge status: Home / Self Care | Attending: Cardiovascular Disease | Admitting: Cardiovascular Disease

## 2024-06-08 ENCOUNTER — Other Ambulatory Visit: Payer: Self-pay

## 2024-06-08 ENCOUNTER — Other Ambulatory Visit (HOSPITAL_COMMUNITY): Payer: Self-pay

## 2024-06-08 ENCOUNTER — Ambulatory Visit: Admitting: Physical Therapy

## 2024-06-08 ENCOUNTER — Observation Stay (HOSPITAL_BASED_OUTPATIENT_CLINIC_OR_DEPARTMENT_OTHER)
Admit: 2024-06-08 | Discharge: 2024-06-08 | Disposition: A | Discharge status: Home / Self Care | Attending: Family Medicine

## 2024-06-08 ENCOUNTER — Encounter
Admission: EM | Disposition: A | Discharge status: Home / Self Care | Payer: Self-pay | Source: Home / Self Care | Attending: Emergency Medicine

## 2024-06-08 ENCOUNTER — Encounter: Payer: Self-pay | Admitting: Emergency Medicine

## 2024-06-08 DIAGNOSIS — J449 Chronic obstructive pulmonary disease, unspecified: Secondary | ICD-10-CM | POA: Diagnosis not present

## 2024-06-08 DIAGNOSIS — I5032 Chronic diastolic (congestive) heart failure: Secondary | ICD-10-CM | POA: Diagnosis not present

## 2024-06-08 DIAGNOSIS — I160 Hypertensive urgency: Secondary | ICD-10-CM | POA: Diagnosis not present

## 2024-06-08 DIAGNOSIS — F17210 Nicotine dependence, cigarettes, uncomplicated: Secondary | ICD-10-CM | POA: Diagnosis not present

## 2024-06-08 DIAGNOSIS — I5033 Acute on chronic diastolic (congestive) heart failure: Secondary | ICD-10-CM | POA: Diagnosis not present

## 2024-06-08 DIAGNOSIS — Z794 Long term (current) use of insulin: Secondary | ICD-10-CM | POA: Diagnosis not present

## 2024-06-08 DIAGNOSIS — N3289 Other specified disorders of bladder: Secondary | ICD-10-CM | POA: Diagnosis not present

## 2024-06-08 DIAGNOSIS — I130 Hypertensive heart and chronic kidney disease with heart failure and stage 1 through stage 4 chronic kidney disease, or unspecified chronic kidney disease: Secondary | ICD-10-CM | POA: Diagnosis not present

## 2024-06-08 DIAGNOSIS — E1142 Type 2 diabetes mellitus with diabetic polyneuropathy: Secondary | ICD-10-CM | POA: Diagnosis present

## 2024-06-08 DIAGNOSIS — M109 Gout, unspecified: Secondary | ICD-10-CM | POA: Diagnosis not present

## 2024-06-08 DIAGNOSIS — Z79899 Other long term (current) drug therapy: Secondary | ICD-10-CM | POA: Diagnosis not present

## 2024-06-08 DIAGNOSIS — I214 Non-ST elevation (NSTEMI) myocardial infarction: Secondary | ICD-10-CM

## 2024-06-08 DIAGNOSIS — Z7982 Long term (current) use of aspirin: Secondary | ICD-10-CM | POA: Diagnosis not present

## 2024-06-08 DIAGNOSIS — K5730 Diverticulosis of large intestine without perforation or abscess without bleeding: Secondary | ICD-10-CM | POA: Diagnosis not present

## 2024-06-08 DIAGNOSIS — R079 Chest pain, unspecified: Principal | ICD-10-CM | POA: Diagnosis present

## 2024-06-08 DIAGNOSIS — E785 Hyperlipidemia, unspecified: Secondary | ICD-10-CM | POA: Diagnosis not present

## 2024-06-08 DIAGNOSIS — M549 Dorsalgia, unspecified: Secondary | ICD-10-CM | POA: Diagnosis not present

## 2024-06-08 DIAGNOSIS — I10 Essential (primary) hypertension: Secondary | ICD-10-CM | POA: Diagnosis not present

## 2024-06-08 DIAGNOSIS — I2510 Atherosclerotic heart disease of native coronary artery without angina pectoris: Secondary | ICD-10-CM | POA: Diagnosis not present

## 2024-06-08 DIAGNOSIS — N1831 Chronic kidney disease, stage 3a: Secondary | ICD-10-CM | POA: Diagnosis not present

## 2024-06-08 DIAGNOSIS — I700 Atherosclerosis of aorta: Secondary | ICD-10-CM | POA: Diagnosis not present

## 2024-06-08 DIAGNOSIS — M79602 Pain in left arm: Secondary | ICD-10-CM | POA: Diagnosis not present

## 2024-06-08 HISTORY — PX: CORONARY IMAGING/OCT: CATH118326

## 2024-06-08 HISTORY — PX: CORONARY STENT INTERVENTION: CATH118234

## 2024-06-08 HISTORY — PX: LEFT HEART CATH AND CORONARY ANGIOGRAPHY: CATH118249

## 2024-06-08 LAB — CBC
HCT: 42.2 % (ref 39.0–52.0)
HCT: 42.5 % (ref 39.0–52.0)
HCT: 49.9 % (ref 39.0–52.0)
Hemoglobin: 14.5 g/dL (ref 13.0–17.0)
Hemoglobin: 14.6 g/dL (ref 13.0–17.0)
Hemoglobin: 17 g/dL (ref 13.0–17.0)
MCH: 29 pg (ref 26.0–34.0)
MCH: 29.1 pg (ref 26.0–34.0)
MCH: 29.3 pg (ref 26.0–34.0)
MCHC: 34.1 g/dL (ref 30.0–36.0)
MCHC: 34.4 g/dL (ref 30.0–36.0)
MCHC: 34.4 g/dL (ref 30.0–36.0)
MCV: 84.4 fL (ref 80.0–100.0)
MCV: 85.2 fL (ref 80.0–100.0)
MCV: 85.3 fL (ref 80.0–100.0)
Platelets: 174 K/uL (ref 150–400)
Platelets: 175 K/uL (ref 150–400)
Platelets: 202 K/uL (ref 150–400)
RBC: 4.99 MIL/uL (ref 4.22–5.81)
RBC: 5 MIL/uL (ref 4.22–5.81)
RBC: 5.85 MIL/uL — ABNORMAL HIGH (ref 4.22–5.81)
RDW: 13.2 % (ref 11.5–15.5)
RDW: 13.2 % (ref 11.5–15.5)
RDW: 13.3 % (ref 11.5–15.5)
WBC: 11.7 K/uL — ABNORMAL HIGH (ref 4.0–10.5)
WBC: 9.3 K/uL (ref 4.0–10.5)
WBC: 9.6 K/uL (ref 4.0–10.5)
nRBC: 0 % (ref 0.0–0.2)
nRBC: 0 % (ref 0.0–0.2)
nRBC: 0 % (ref 0.0–0.2)

## 2024-06-08 LAB — HEPATIC FUNCTION PANEL
ALT: 22 U/L (ref 0–44)
AST: 22 U/L (ref 15–41)
Albumin: 3.6 g/dL (ref 3.5–5.0)
Alkaline Phosphatase: 63 U/L (ref 38–126)
Bilirubin, Direct: 0.1 mg/dL (ref 0.0–0.2)
Indirect Bilirubin: 0.8 mg/dL (ref 0.3–0.9)
Total Bilirubin: 0.9 mg/dL (ref 0.0–1.2)
Total Protein: 6.6 g/dL (ref 6.5–8.1)

## 2024-06-08 LAB — BASIC METABOLIC PANEL WITH GFR
Anion gap: 11 (ref 5–15)
Anion gap: 14 (ref 5–15)
BUN: 12 mg/dL (ref 6–20)
BUN: 12 mg/dL (ref 6–20)
CO2: 23 mmol/L (ref 22–32)
CO2: 24 mmol/L (ref 22–32)
Calcium: 8.5 mg/dL — ABNORMAL LOW (ref 8.9–10.3)
Calcium: 8.9 mg/dL (ref 8.9–10.3)
Chloride: 102 mmol/L (ref 98–111)
Chloride: 105 mmol/L (ref 98–111)
Creatinine, Ser: 1.3 mg/dL — ABNORMAL HIGH (ref 0.61–1.24)
Creatinine, Ser: 1.48 mg/dL — ABNORMAL HIGH (ref 0.61–1.24)
GFR, Estimated: 55 mL/min — ABNORMAL LOW (ref >60)
GFR, Estimated: >60 mL/min (ref >60)
Glucose, Bld: 236 mg/dL — ABNORMAL HIGH (ref 70–99)
Glucose, Bld: 242 mg/dL — ABNORMAL HIGH (ref 70–99)
Potassium: 3.5 mmol/L (ref 3.5–5.1)
Potassium: 3.5 mmol/L (ref 3.5–5.1)
Sodium: 139 mmol/L (ref 135–145)
Sodium: 140 mmol/L (ref 135–145)

## 2024-06-08 LAB — CBG MONITORING, ED: Glucose-Capillary: 168 mg/dL — ABNORMAL HIGH (ref 70–99)

## 2024-06-08 LAB — LIPID PANEL
Cholesterol: 181 mg/dL (ref 0–200)
HDL: 38 mg/dL — ABNORMAL LOW (ref >40)
LDL Cholesterol: 101 mg/dL — ABNORMAL HIGH (ref 0–99)
Total CHOL/HDL Ratio: 4.8 ratio
Triglycerides: 208 mg/dL — ABNORMAL HIGH (ref <150)
VLDL: 42 mg/dL — ABNORMAL HIGH (ref 0–40)

## 2024-06-08 LAB — ECHOCARDIOGRAM COMPLETE
AR max vel: 1.84 cm2
AV Area VTI: 2.15 cm2
AV Area mean vel: 1.89 cm2
AV Mean grad: 6 mmHg
AV Peak grad: 12.4 mmHg
Ao pk vel: 1.76 m/s
Area-P 1/2: 3.66 cm2
MV VTI: 2.19 cm2
S' Lateral: 2.54 cm

## 2024-06-08 LAB — LIPASE, BLOOD: Lipase: 52 U/L — ABNORMAL HIGH (ref 11–51)

## 2024-06-08 LAB — GLUCOSE, CAPILLARY
Glucose-Capillary: 131 mg/dL — ABNORMAL HIGH (ref 70–99)
Glucose-Capillary: 165 mg/dL — ABNORMAL HIGH (ref 70–99)
Glucose-Capillary: 165 mg/dL — ABNORMAL HIGH (ref 70–99)

## 2024-06-08 LAB — PROTIME-INR
INR: 1.1 (ref 0.8–1.2)
Prothrombin Time: 14.5 s (ref 11.4–15.2)

## 2024-06-08 LAB — TROPONIN I (HIGH SENSITIVITY)
Troponin I (High Sensitivity): 1696 ng/L (ref <18)
Troponin I (High Sensitivity): 19 ng/L — ABNORMAL HIGH (ref <18)
Troponin I (High Sensitivity): 47 ng/L — ABNORMAL HIGH (ref <18)

## 2024-06-08 LAB — CREATININE, SERUM
Creatinine, Ser: 1.19 mg/dL (ref 0.61–1.24)
GFR, Estimated: >60 mL/min (ref >60)

## 2024-06-08 LAB — POCT ACTIVATED CLOTTING TIME
Activated Clotting Time: 279 s
Activated Clotting Time: 308 s

## 2024-06-08 LAB — SEDIMENTATION RATE: Sed Rate: 7 mm/h (ref 0–20)

## 2024-06-08 LAB — HELIX PHARMACOGENOMICS (PGX) CLOPIDOGREL TEST

## 2024-06-08 LAB — HEMOGLOBIN A1C
Hgb A1c MFr Bld: 9.6 % — ABNORMAL HIGH (ref 4.8–5.6)
Mean Plasma Glucose: 228.82 mg/dL

## 2024-06-08 LAB — HEPARIN LEVEL (UNFRACTIONATED): Heparin Unfractionated: 0.18 [IU]/mL — ABNORMAL LOW (ref 0.30–0.70)

## 2024-06-08 LAB — C-REACTIVE PROTEIN: CRP: 1 mg/dL — ABNORMAL HIGH (ref <1.0)

## 2024-06-08 SURGERY — LEFT HEART CATH AND CORONARY ANGIOGRAPHY
Anesthesia: Moderate Sedation

## 2024-06-08 MED ORDER — LIDOCAINE HCL 1 % IJ SOLN
INTRAMUSCULAR | Status: AC
  Filled 2024-06-08: qty 20

## 2024-06-08 MED ORDER — SODIUM CHLORIDE 0.9 % IV SOLN: 250.0000 mL | INTRAVENOUS | Status: AC | PRN

## 2024-06-08 MED ORDER — ENOXAPARIN SODIUM 40 MG/0.4ML IJ SOSY
40.0000 mg | PREFILLED_SYRINGE | INTRAMUSCULAR | Status: DC
  Administered 2024-06-09: 40 mg via SUBCUTANEOUS
  Filled 2024-06-08 (×2): qty 0.4

## 2024-06-08 MED ORDER — HEPARIN (PORCINE) IN NACL 2000-0.9 UNIT/L-% IV SOLN
INTRAVENOUS | Status: DC | PRN
  Administered 2024-06-08: 1000 mL

## 2024-06-08 MED ORDER — FLUTICASONE PROPIONATE 50 MCG/ACT NA SUSP
1.0000 | Freq: Every day | NASAL | Status: DC
  Administered 2024-06-08: 1 via NASAL
  Filled 2024-06-08: qty 16

## 2024-06-08 MED ORDER — HEPARIN SODIUM (PORCINE) 1000 UNIT/ML IJ SOLN
INTRAMUSCULAR | Status: AC
  Filled 2024-06-08: qty 10

## 2024-06-08 MED ORDER — IOHEXOL 300 MG/ML  SOLN
INTRAMUSCULAR | Status: DC | PRN
  Administered 2024-06-08: 190 mL

## 2024-06-08 MED ORDER — MORPHINE SULFATE (PF) 2 MG/ML IV SOLN: 2.0000 mg | INTRAVENOUS | Status: DC | PRN

## 2024-06-08 MED ORDER — SODIUM CHLORIDE 0.9% FLUSH: 3.0000 mL | INTRAVENOUS | Status: DC | PRN

## 2024-06-08 MED ORDER — INSULIN GLARGINE 100 UNIT/ML ~~LOC~~ SOLN
20.0000 [IU] | Freq: Every day | SUBCUTANEOUS | Status: DC
  Administered 2024-06-08: 20 [IU] via SUBCUTANEOUS
  Filled 2024-06-08 (×2): qty 0.2

## 2024-06-08 MED ORDER — FENTANYL CITRATE (PF) 100 MCG/2ML IJ SOLN
INTRAMUSCULAR | Status: AC
  Filled 2024-06-08: qty 2

## 2024-06-08 MED ORDER — HEPARIN SODIUM (PORCINE) 1000 UNIT/ML IJ SOLN
INTRAMUSCULAR | Status: DC | PRN
  Administered 2024-06-08: 2000 [IU] via INTRAVENOUS
  Administered 2024-06-08 (×2): 5000 [IU] via INTRAVENOUS

## 2024-06-08 MED ORDER — PRASUGREL HCL 10 MG PO TABS
ORAL_TABLET | ORAL | Status: AC
  Filled 2024-06-08: qty 6

## 2024-06-08 MED ORDER — VERAPAMIL HCL 2.5 MG/ML IV SOLN
INTRAVENOUS | Status: AC
  Filled 2024-06-08: qty 2

## 2024-06-08 MED ORDER — FENTANYL CITRATE (PF) 100 MCG/2ML IJ SOLN
INTRAMUSCULAR | Status: DC | PRN
  Administered 2024-06-08: 25 ug via INTRAVENOUS

## 2024-06-08 MED ORDER — RANOLAZINE ER 500 MG PO TB12
500.0000 mg | ORAL_TABLET | Freq: Two times a day (BID) | ORAL | Status: DC
  Administered 2024-06-08 – 2024-06-09 (×3): 500 mg via ORAL
  Filled 2024-06-08 (×3): qty 1

## 2024-06-08 MED ORDER — SODIUM CHLORIDE 0.9 % IV SOLN: INTRAVENOUS | Status: DC

## 2024-06-08 MED ORDER — HEPARIN (PORCINE) 25000 UT/250ML-% IV SOLN
1400.0000 [IU]/h | INTRAVENOUS | Status: DC
  Administered 2024-06-08: 1150 [IU]/h via INTRAVENOUS
  Filled 2024-06-08: qty 250

## 2024-06-08 MED ORDER — MAGNESIUM HYDROXIDE 400 MG/5ML PO SUSP: 30.0000 mL | Freq: Every day | ORAL | Status: DC | PRN

## 2024-06-08 MED ORDER — ALPRAZOLAM 0.25 MG PO TABS: 0.2500 mg | ORAL_TABLET | Freq: Two times a day (BID) | ORAL | Status: DC | PRN

## 2024-06-08 MED ORDER — ROSUVASTATIN CALCIUM 10 MG PO TABS
40.0000 mg | ORAL_TABLET | Freq: Every day | ORAL | Status: DC
  Administered 2024-06-08 – 2024-06-09 (×2): 40 mg via ORAL
  Filled 2024-06-08: qty 4
  Filled 2024-06-08: qty 2

## 2024-06-08 MED ORDER — SPIRONOLACTONE 25 MG PO TABS: 25.0000 mg | ORAL_TABLET | Freq: Every day | ORAL | Status: DC

## 2024-06-08 MED ORDER — LIDOCAINE HCL (PF) 1 % IJ SOLN
INTRAMUSCULAR | Status: DC | PRN
  Administered 2024-06-08: 2 mL

## 2024-06-08 MED ORDER — ASPIRIN 81 MG PO TBEC
81.0000 mg | DELAYED_RELEASE_TABLET | Freq: Every day | ORAL | Status: DC
  Administered 2024-06-09: 81 mg via ORAL
  Filled 2024-06-08: qty 1

## 2024-06-08 MED ORDER — MIDAZOLAM HCL 2 MG/2ML IJ SOLN
INTRAMUSCULAR | Status: DC | PRN
  Administered 2024-06-08: 1 mg via INTRAVENOUS

## 2024-06-08 MED ORDER — HEPARIN (PORCINE) IN NACL 1000-0.9 UT/500ML-% IV SOLN
INTRAVENOUS | Status: AC
  Filled 2024-06-08: qty 1000

## 2024-06-08 MED ORDER — NITROGLYCERIN 0.4 MG SL SUBL
0.4000 mg | SUBLINGUAL_TABLET | SUBLINGUAL | Status: DC | PRN
  Administered 2024-06-08: 0.4 mg via SUBLINGUAL
  Filled 2024-06-08: qty 1

## 2024-06-08 MED ORDER — CROMOLYN SODIUM 4 % OP SOLN: 1.0000 [drp] | Freq: Four times a day (QID) | OPHTHALMIC | Status: DC | PRN

## 2024-06-08 MED ORDER — ALLOPURINOL 100 MG PO TABS
100.0000 mg | ORAL_TABLET | Freq: Every day | ORAL | Status: DC
Start: 2024-06-08 — End: 2024-06-09
  Administered 2024-06-08 – 2024-06-09 (×2): 100 mg via ORAL
  Filled 2024-06-08 (×2): qty 1

## 2024-06-08 MED ORDER — AMLODIPINE BESYLATE 10 MG PO TABS
10.0000 mg | ORAL_TABLET | Freq: Every day | ORAL | Status: DC
  Administered 2024-06-08 – 2024-06-09 (×2): 10 mg via ORAL
  Filled 2024-06-08: qty 1
  Filled 2024-06-08: qty 2

## 2024-06-08 MED ORDER — DULOXETINE HCL 30 MG PO CPEP
60.0000 mg | ORAL_CAPSULE | ORAL | Status: DC
Start: 2024-06-08 — End: 2024-06-09
  Administered 2024-06-08 – 2024-06-09 (×2): 60 mg via ORAL
  Filled 2024-06-08: qty 2
  Filled 2024-06-08: qty 1

## 2024-06-08 MED ORDER — NICOTINE 21 MG/24HR TD PT24
21.0000 mg | MEDICATED_PATCH | Freq: Every day | TRANSDERMAL | Status: DC
  Administered 2024-06-08: 21 mg via TRANSDERMAL
  Filled 2024-06-08: qty 1

## 2024-06-08 MED ORDER — ASPIRIN 81 MG PO TBEC: 81.0000 mg | DELAYED_RELEASE_TABLET | Freq: Every day | ORAL | Status: DC

## 2024-06-08 MED ORDER — PREGABALIN 50 MG PO CAPS: 50.0000 mg | ORAL_CAPSULE | Freq: Two times a day (BID) | ORAL | Status: DC | PRN

## 2024-06-08 MED ORDER — SODIUM CHLORIDE 0.9% FLUSH
3.0000 mL | Freq: Two times a day (BID) | INTRAVENOUS | Status: DC
  Administered 2024-06-08 – 2024-06-09 (×2): 3 mL via INTRAVENOUS

## 2024-06-08 MED ORDER — HEPARIN BOLUS VIA INFUSION
4000.0000 [IU] | Freq: Once | INTRAVENOUS | Status: AC
  Administered 2024-06-08: 4000 [IU] via INTRAVENOUS
  Filled 2024-06-08: qty 4000

## 2024-06-08 MED ORDER — ACETAMINOPHEN 325 MG PO TABS
650.0000 mg | ORAL_TABLET | ORAL | Status: DC | PRN
Start: 2024-06-08 — End: 2024-06-09

## 2024-06-08 MED ORDER — EMPAGLIFLOZIN 10 MG PO TABS
10.0000 mg | ORAL_TABLET | Freq: Every day | ORAL | Status: DC
  Administered 2024-06-08 – 2024-06-09 (×2): 10 mg via ORAL
  Filled 2024-06-08 (×2): qty 1

## 2024-06-08 MED ORDER — PANTOPRAZOLE SODIUM 40 MG PO TBEC
40.0000 mg | DELAYED_RELEASE_TABLET | Freq: Every day | ORAL | Status: DC
  Administered 2024-06-08 – 2024-06-09 (×2): 40 mg via ORAL
  Filled 2024-06-08 (×2): qty 1

## 2024-06-08 MED ORDER — DULOXETINE HCL 30 MG PO CPEP
30.0000 mg | ORAL_CAPSULE | Freq: Every evening | ORAL | Status: DC
  Administered 2024-06-08: 30 mg via ORAL
  Filled 2024-06-08: qty 1

## 2024-06-08 MED ORDER — TORSEMIDE 20 MG PO TABS: 40.0000 mg | ORAL_TABLET | Freq: Every day | ORAL | Status: DC

## 2024-06-08 MED ORDER — INSULIN ASPART 100 UNIT/ML IJ SOLN
0.0000 [IU] | Freq: Three times a day (TID) | INTRAMUSCULAR | Status: DC
  Administered 2024-06-08 (×2): 3 [IU] via SUBCUTANEOUS
  Administered 2024-06-09 (×2): 2 [IU] via SUBCUTANEOUS
  Filled 2024-06-08 (×4): qty 1

## 2024-06-08 MED ORDER — HEPARIN BOLUS VIA INFUSION
2500.0000 [IU] | Freq: Once | INTRAVENOUS | Status: AC
  Administered 2024-06-08: 2500 [IU] via INTRAVENOUS
  Filled 2024-06-08: qty 2500

## 2024-06-08 MED ORDER — INSULIN ASPART 100 UNIT/ML IJ SOLN: 0.0000 [IU] | Freq: Every day | INTRAMUSCULAR | Status: DC

## 2024-06-08 MED ORDER — NICOTINE POLACRILEX 2 MG MT GUM: 4.0000 mg | CHEWING_GUM | OROMUCOSAL | Status: DC | PRN

## 2024-06-08 MED ORDER — BISOPROLOL FUMARATE 5 MG PO TABS
10.0000 mg | ORAL_TABLET | Freq: Every day | ORAL | Status: DC
  Administered 2024-06-08 – 2024-06-09 (×2): 10 mg via ORAL
  Filled 2024-06-08 (×2): qty 2

## 2024-06-08 MED ORDER — MORPHINE SULFATE (PF) 4 MG/ML IV SOLN
4.0000 mg | Freq: Once | INTRAVENOUS | Status: AC
  Administered 2024-06-08: 4 mg via INTRAVENOUS
  Filled 2024-06-08: qty 1

## 2024-06-08 MED ORDER — ASPIRIN 81 MG PO CHEW
324.0000 mg | CHEWABLE_TABLET | ORAL | Status: AC
  Administered 2024-06-08: 324 mg via ORAL
  Filled 2024-06-08: qty 4

## 2024-06-08 MED ORDER — ASPIRIN 300 MG RE SUPP: 300.0000 mg | RECTAL | Status: AC

## 2024-06-08 MED ORDER — SUCRALFATE 1 G PO TABS
1.0000 g | ORAL_TABLET | Freq: Three times a day (TID) | ORAL | Status: DC
  Administered 2024-06-08 – 2024-06-09 (×4): 1 g via ORAL
  Filled 2024-06-08 (×4): qty 1

## 2024-06-08 MED ORDER — ONDANSETRON HCL 4 MG/2ML IJ SOLN
4.0000 mg | Freq: Four times a day (QID) | INTRAMUSCULAR | Status: DC | PRN
  Administered 2024-06-09: 4 mg via INTRAVENOUS
  Filled 2024-06-08: qty 2

## 2024-06-08 MED ORDER — NITROGLYCERIN IN D5W 200-5 MCG/ML-% IV SOLN
0.0000 ug/min | INTRAVENOUS | Status: DC
  Administered 2024-06-08: 10 ug/min via INTRAVENOUS
  Administered 2024-06-08: 30 ug/min via INTRAVENOUS
  Filled 2024-06-08 (×2): qty 250

## 2024-06-08 MED ORDER — HEPARIN BOLUS VIA INFUSION
4000.0000 [IU] | Freq: Once | INTRAVENOUS | Status: DC
  Filled 2024-06-08: qty 4000

## 2024-06-08 MED ORDER — LORATADINE 10 MG PO TABS
10.0000 mg | ORAL_TABLET | Freq: Every day | ORAL | Status: DC
  Administered 2024-06-08 – 2024-06-09 (×2): 10 mg via ORAL
  Filled 2024-06-08 (×2): qty 1

## 2024-06-08 MED ORDER — POTASSIUM CHLORIDE CRYS ER 20 MEQ PO TBCR
40.0000 meq | EXTENDED_RELEASE_TABLET | Freq: Once | ORAL | Status: AC
  Administered 2024-06-08: 40 meq via ORAL
  Filled 2024-06-08: qty 2

## 2024-06-08 MED ORDER — FENTANYL CITRATE PF 50 MCG/ML IJ SOSY
50.0000 ug | PREFILLED_SYRINGE | Freq: Once | INTRAMUSCULAR | Status: AC
  Administered 2024-06-08: 50 ug via INTRAVENOUS
  Filled 2024-06-08: qty 1

## 2024-06-08 MED ORDER — BACLOFEN 10 MG PO TABS: 10.0000 mg | ORAL_TABLET | Freq: Three times a day (TID) | ORAL | Status: DC | PRN

## 2024-06-08 MED ORDER — IOHEXOL 350 MG/ML SOLN
100.0000 mL | Freq: Once | INTRAVENOUS | Status: AC | PRN
  Administered 2024-06-08: 100 mL via INTRAVENOUS

## 2024-06-08 MED ORDER — UMECLIDINIUM BROMIDE 62.5 MCG/ACT IN AEPB
1.0000 | INHALATION_SPRAY | Freq: Every day | RESPIRATORY_TRACT | Status: DC
  Administered 2024-06-08 – 2024-06-09 (×2): 1 via RESPIRATORY_TRACT
  Filled 2024-06-08 (×3): qty 7

## 2024-06-08 MED ORDER — MIDAZOLAM HCL 2 MG/2ML IJ SOLN
INTRAMUSCULAR | Status: AC
  Filled 2024-06-08: qty 2

## 2024-06-08 MED ORDER — VERAPAMIL HCL 2.5 MG/ML IV SOLN
INTRAVENOUS | Status: DC | PRN
  Administered 2024-06-08: 2.5 mg via INTRA_ARTERIAL

## 2024-06-08 MED ORDER — ASPIRIN 81 MG PO CHEW: 81.0000 mg | CHEWABLE_TABLET | ORAL | Status: DC

## 2024-06-08 MED ORDER — IRBESARTAN 75 MG PO TABS: 75.0000 mg | ORAL_TABLET | Freq: Every day | ORAL | Status: DC

## 2024-06-08 MED ORDER — HEPARIN (PORCINE) 25000 UT/250ML-% IV SOLN: 1150.0000 [IU]/h | INTRAVENOUS | Status: DC

## 2024-06-08 MED ORDER — IPRATROPIUM-ALBUTEROL 0.5-2.5 (3) MG/3ML IN SOLN: 3.0000 mL | Freq: Four times a day (QID) | RESPIRATORY_TRACT | Status: DC | PRN

## 2024-06-08 MED ORDER — ONDANSETRON HCL 4 MG/2ML IJ SOLN
INTRAMUSCULAR | Status: AC
  Administered 2024-06-08: 4 mg via INTRAVENOUS
  Filled 2024-06-08: qty 2

## 2024-06-08 MED ORDER — PRASUGREL HCL 10 MG PO TABS
10.0000 mg | ORAL_TABLET | Freq: Every day | ORAL | Status: DC
  Administered 2024-06-09: 10 mg via ORAL
  Filled 2024-06-08: qty 1

## 2024-06-08 MED ORDER — EZETIMIBE 10 MG PO TABS
10.0000 mg | ORAL_TABLET | Freq: Every day | ORAL | Status: DC
  Administered 2024-06-08 – 2024-06-09 (×2): 10 mg via ORAL
  Filled 2024-06-08 (×2): qty 1

## 2024-06-08 MED ORDER — TRAZODONE HCL 50 MG PO TABS: 25.0000 mg | ORAL_TABLET | Freq: Every evening | ORAL | Status: DC | PRN

## 2024-06-08 MED ORDER — PRASUGREL HCL 10 MG PO TABS
ORAL_TABLET | ORAL | Status: DC | PRN
  Administered 2024-06-08: 60 mg via ORAL

## 2024-06-08 SURGICAL SUPPLY — 17 items
BALLOON TREK RX 2.5X12 (BALLOONS) IMPLANT
CATH DRAGONFLY OPSTAR (CATHETERS) IMPLANT
CATH INFINITI AMBI 5FR JK (CATHETERS) IMPLANT
CATH LAUNCHER 6FR EBU 3.75 (CATHETERS) IMPLANT
DEVICE RAD TR BAND REGULAR (VASCULAR PRODUCTS) IMPLANT
DRAPE BRACHIAL (DRAPES) IMPLANT
GLIDESHEATH SLEND SS 6F .021 (SHEATH) IMPLANT
GUIDEWIRE INQWIRE 1.5J.035X260 (WIRE) IMPLANT
KIT ENCORE 26 ADVANTAGE (KITS) IMPLANT
KIT SYRINGE INJ CVI SPIKEX1 (MISCELLANEOUS) IMPLANT
PACK CARDIAC CATH (CUSTOM PROCEDURE TRAY) ×2 IMPLANT
SET ATX-X65L (MISCELLANEOUS) IMPLANT
STATION PROTECTION PRESSURIZED (MISCELLANEOUS) IMPLANT
STENT ONYX FRONTIER 3.0X15 (Permanent Stent) IMPLANT
TUBING CIL FLEX 10 FLL-RA (TUBING) IMPLANT
WIRE RUNTHROUGH .014X180CM (WIRE) IMPLANT
WIRE RUNTHROUGH IZANAI 014 180 (WIRE) IMPLANT

## 2024-06-08 NOTE — ED Notes (Signed)
 Pt describes a tightness in his upper chest with radiation to his back which is increasing

## 2024-06-08 NOTE — H&P (Addendum)
 [place]   PATIENT NAME: Cory Rivas    MR#:  [id number]  DATE OF BIRTH:  [birth year]  DATE OF ADMISSION:  06/08/2024  PRIMARY CARE PHYSICIAN: Leavy Mole, PA-C   Patient is coming from: Home  REQUESTING/REFERRING PHYSICIAN: Levander Slate, MD  CHIEF COMPLAINT:   Chief Complaint  Patient presents with   Chest Pain    HISTORY OF PRESENT ILLNESS:  Cory Rivas is a 58 y.o. Obese Caucasian male with medical history significant for stage III chronic kidney disease, COPD, type 2 diabetes mellitus with peripheral neuropathy, hypertension, dyslipidemia, OSA, gout and coronary artery disease, who presented to the emergency room with acute onset of central chest pain graded 10/10 in severity and felt chest pressure with associated radiation to the left arm.  He admitted to associated diaphoresis as well as mild dyspnea without palpitations.  He admitted to mild cough productive of clear sputum as well as lightheadedness.  No nausea or vomiting.  No fever or chills.  No leg pain or edema no recent travels or surgeries.  No dysuria, oliguria or hematuria or flank pain.  No other bleeding diathesis.  ED Course: When he came to the ER, BP was 198/112 with respiratory rate of 24 and otherwise normal vital signs.  Labs revealed Creatinine 1.40 previously normal.  High-sensitivity troponin was 19 and later 47.  CBC was within normal. EKG as reviewed by me : EKG showed initially, normal sinus rhythm with a rate of 100 with T wave inversion inferiorly and laterally. Imaging: Pulm chest x-ray showed no acute cardiopulmonary disease.  CTA of the chest revealed no evidence for PE.  CT of the abdomen and pelvis showed no arterial abnormality.  It showed diverticulosis without diverticulitis.  The patient was given IV fentanyl , sublingual nitroglycerin  and 4 mg of IV morphine  sulfate as well as IV nitroglycerin  drip and he was pain-free. PAST MEDICAL HISTORY:   Past Medical History:  Diagnosis  Date   Bulging of cervical intervertebral disc    CAD (coronary artery disease)    a. 09/2020 MV: low risk; b. 05/2023 Cath: Moderate LAD dzs w/ seq 50-60% stenoses and nl RFR 0.93. Mod OM dzs->Med rx; b. 10/2023 Cath: LM min irregs, LAD 55ost, 55p/m, LCX large nl, OM1 50, RCA mod, nl.   Cervical myelopathy (HCC)    Chronic heart failure with preserved ejection fraction (HFpEF) (HCC)    a. 05/2023 Echo: EF 50-55%, no rwma, GrIII DD, nl RV fxn, mild MR, Ao sclerosis; b. 10/2023 Echo: EF 55-60%, no rwma, nl RV fxn, mild MR, Ao sclerosis.   CKD (chronic kidney disease), stage III (HCC)    COPD (chronic obstructive pulmonary disease) (HCC)    Diabetes mellitus without complication (HCC)    Gout    Hyperlipidemia LDL goal <70    Hypertension    Morbid obesity (HCC)    OSA (obstructive sleep apnea)    Stress due to family tension 04/15/2019   Tracheobronchomalacia     PAST SURGICAL HISTORY:   Past Surgical History:  Procedure Laterality Date   BACK SURGERY     CARDIAC CATHETERIZATION     CHOLECYSTECTOMY     COLONOSCOPY WITH PROPOFOL  N/A 03/10/2020   Procedure: COLONOSCOPY WITH PROPOFOL ;  Surgeon: Jinny Carmine, MD;  Location: [facility];  Service: Endoscopy;  Laterality: N/A;   COLONOSCOPY WITH PROPOFOL  N/A 09/25/2023   Procedure: COLONOSCOPY WITH PROPOFOL ;  Surgeon: Jinny Carmine, MD;  Location: [facility];  Service: Endoscopy;  Laterality:  N/A;   CORONARY PRESSURE/FFR STUDY N/A 05/29/2023   Procedure: CORONARY PRESSURE/FFR STUDY;  Surgeon: Mady Bruckner, MD;  Location: [facility];  Service: Cardiovascular;  Laterality: N/A;   POLYPECTOMY  09/25/2023   Procedure: POLYPECTOMY;  Surgeon: Jinny Carmine, MD;  Location: [facility];  Service: Endoscopy;;   RIGHT/LEFT HEART CATH AND CORONARY ANGIOGRAPHY N/A 05/29/2023   Procedure: RIGHT/LEFT HEART CATH AND CORONARY ANGIOGRAPHY;  Surgeon: Mady Bruckner, MD;  Location: [facility];  Service: Cardiovascular;   Laterality: N/A;   RIGHT/LEFT HEART CATH AND CORONARY ANGIOGRAPHY Bilateral 10/30/2023   Procedure: RIGHT/LEFT HEART CATH AND CORONARY ANGIOGRAPHY;  Surgeon: Mady Bruckner, MD;  Location: [facility];  Service: Cardiovascular;  Laterality: Bilateral;    SOCIAL HISTORY:   Social History   Tobacco Use   Smoking status: Every Day    Current packs/day: 0.00    Average packs/day: 1 pack/day for 37.0 years (37.0 ttl pk-yrs)    Types: Cigarettes    Start date: 10/19/1989    Last attempt to quit: 08/20/2022    Years since quitting: 1.8   Smokeless tobacco: Never   Tobacco comments:    30+ years hx smoking 2 ppd, stopped smoking briefly in April 2022 for a surgery and then restarted smoking.  As of August 2025, he smoking about 2 to 4 cigarettes a day.  Substance Use Topics   Alcohol use: No    FAMILY HISTORY:   Family History  Problem Relation Age of Onset   Heart disease Mother    Diabetes Mother    Hyperlipidemia Mother    Hypertension Mother    Heart attack Mother    Lung cancer Father    Hypertension Father    Stroke Father    AAA (abdominal aortic aneurysm) Maternal Grandmother    Heart attack Maternal Grandmother    Diabetes Maternal Grandfather     DRUG ALLERGIES:   Allergies  Allergen Reactions   Oxycodone  Nausea And Vomiting   Entresto  [Sacubitril -Valsartan ] Nausea Only and Other (See Comments)    Lightheaded and dizzy    Onion Nausea And Vomiting   Nitroglycerin  Itching and Rash    IV product, patient says SL tablet is fine    REVIEW OF SYSTEMS:   ROS As per history of present illness. All pertinent systems were reviewed above. Constitutional, HEENT, cardiovascular, respiratory, GI, GU, musculoskeletal, neuro, psychiatric, endocrine, integumentary and hematologic systems were reviewed and are otherwise negative/unremarkable except for positive findings mentioned above in the HPI.   MEDICATIONS AT HOME:   Prior to Admission medications    Medication Sig Start Date End Date Taking? Authorizing Provider  allopurinol  (ZYLOPRIM ) 100 MG tablet Take 1 tablet (100 mg total) by mouth daily with the 300mg  tablet for a total daily dose of 400mg . 06/02/24   Tapia, Leisa, PA-C  allopurinol  (ZYLOPRIM ) 300 MG tablet Take 1 tablet (300 mg total) by mouth daily as needed (Gout flare). Home med. 05/17/24   Awanda City, MD  amLODipine  (NORVASC ) 10 MG tablet Take 1 tablet (10 mg total) by mouth daily. 05/29/24   End, Bruckner, MD  aspirin  EC 81 MG tablet Take 1 tablet (81 mg total) by mouth daily. Swallow whole. 11/11/23   Tapia, Leisa, PA-C  baclofen  (LIORESAL ) 10 MG tablet Take 1 tablet (10 mg total) by mouth 3 (three) times daily as needed for muscle spasms. 01/27/24   Tapia, Leisa, PA-C  bisoprolol  (ZEBETA ) 5 MG tablet Take 2 tablets (10 mg total) by mouth daily.  04/02/24   Malvina Alm DASEN, MD  budesonide -formoterol  (SYMBICORT ) 160-4.5 MCG/ACT inhaler Inhale 2 puffs into the lungs 2 (two) times daily. 10/24/23   Von Bellis, MD  cetirizine  (ZYRTEC ) 10 MG tablet Take 1 tablet (10 mg total) by mouth daily. 05/15/24   [name], DO  Continuous Glucose Sensor (FREESTYLE LIBRE 3 SENSOR) MISC Place 1 sensor onto skin every 14 (fourteen) days to monitor blood sugar continuously 06/05/24   Tapia, Leisa, PA-C  cromolyn  (OPTICROM ) 4 % ophthalmic solution Place 1 drop into both eyes 4 (four) times daily as needed. 02/10/24   Tapia, Leisa, PA-C  Dulaglutide  (TRULICITY ) 3 MG/0.5ML SOAJ Inject 3 mg into the skin once a week. 09/05/23     DULoxetine  (CYMBALTA ) 30 MG capsule Take 2 capsules (60 mg total) by mouth every morning AND 1 capsule (30 mg total) every evening. 06/02/24   Tapia, Leisa, PA-C  empagliflozin  (JARDIANCE ) 10 MG TABS tablet Take 1 tablet (10 mg total) by mouth daily. 05/29/24   Tapia, Leisa, PA-C  ezetimibe  (ZETIA ) 10 MG tablet Take 1 tablet (10 mg total) by mouth daily. 06/02/24   Tapia, Leisa, PA-C  fluticasone  (FLONASE ) 50 MCG/ACT nasal spray  Place 1 spray into both nostrils daily. 02/10/24 02/09/25  Tapia, Leisa, PA-C  insulin  glargine (LANTUS  SOLOSTAR) 100 UNIT/ML Solostar Pen Inject 20-25 Units into the skin at bedtime. 05/29/24   Tapia, Leisa, PA-C  insulin  lispro (HUMALOG ) 100 UNIT/ML KwikPen Inject 10 Units into the skin 3 (three) times daily. Plus sliding scale up per instructions 06/04/24   Tapia, Leisa, PA-C  ipratropium-albuterol  (DUONEB) 0.5-2.5 (3) MG/3ML SOLN Take 3 mLs by nebulization every 6 (six) hours as needed. 02/10/24   Tapia, Leisa, PA-C  levocetirizine (XYZAL ) 5 MG tablet Take 1 tablet (5 mg total) by mouth every evening. 02/10/24   Tapia, Leisa, PA-C  nicotine  polacrilex (NICOTINE  MINI) 4 MG lozenge Take 1 lozenge (4 mg total) by mouth as needed. 03/06/24   Tapia, Leisa, PA-C  pantoprazole  (PROTONIX ) 40 MG tablet Take 1 tablet (40 mg total) by mouth daily. 06/04/24 06/04/25  Bradler, Evan K, MD  pregabalin  (LYRICA ) 50 MG capsule Take 1 capsule (50 mg total) by mouth 2 (two) times daily as needed. Home med. 05/17/24   Awanda City, MD  ranolazine  (RANEXA ) 500 MG 12 hr tablet Take 1 tablet (500 mg total) by mouth 2 (two) times daily. 05/17/24   Awanda City, MD  rosuvastatin  (CRESTOR ) 40 MG tablet Take 1 tablet (40 mg total) by mouth daily. 06/02/24   Tapia, Leisa, PA-C  spironolactone  (ALDACTONE ) 25 MG tablet Take 1 tablet (25 mg total) by mouth daily. 01/29/24 08/13/24  Donette City LABOR, FNP  sucralfate  (CARAFATE ) 1 g tablet Take 1 tablet (1 g total) by mouth 4 (four) times daily -  with meals and at bedtime. 06/04/24 07/05/24  Bradler, Evan K, MD  Tiotropium Bromide  Monohydrate (SPIRIVA  RESPIMAT) 2.5 MCG/ACT AERS Inhale 2 puffs into the lungs daily. 11/05/23   Isadora Hose, MD  torsemide  (DEMADEX ) 20 MG tablet Take 2 tablets (40 mg total) by mouth daily. 03/03/24   Donette City LABOR, FNP  valsartan  (DIOVAN ) 80 MG tablet Take 1 tablet (80 mg total) by mouth daily. 03/17/24   Donette City LABOR, FNP  VENTOLIN  HFA 108 (90 Base) MCG/ACT inhaler  Inhale 2 puffs into the lungs every 6 (six) hours as needed for wheezing or shortness of breath. 03/04/24   Leavy Mole, PA-C      VITAL SIGNS:  Blood pressure 128/87,  pulse 73, temperature 98.5 F (36.9 C), temperature source Oral, resp. rate 16, height 5' 7 (1.702 m), weight 99.8 kg, SpO2 94%.  PHYSICAL EXAMINATION:  Physical Exam  GENERAL:  58 y.o.-year-old obese Caucasian patient lying in the bed with no acute distress.  EYES: Pupils equal, round, reactive to light and accommodation. No scleral icterus. Extraocular muscles intact.  HEENT: Head atraumatic, normocephalic. Oropharynx and nasopharynx clear.  NECK:  Supple, no jugular venous distention. No thyroid enlargement, no tenderness.  LUNGS: Normal breath sounds bilaterally, no wheezing, rales,rhonchi or crepitation. No use of accessory muscles of respiration.  CARDIOVASCULAR: Regular rate and rhythm, S1, S2 normal. No murmurs, rubs, or gallops.  ABDOMEN: Soft, nondistended, nontender. Bowel sounds present. No organomegaly or mass.  EXTREMITIES: No pedal edema, cyanosis, or clubbing.  NEUROLOGIC: Cranial nerves II through XII are intact. Muscle strength 5/5 in all extremities. Sensation intact. Gait not checked.  PSYCHIATRIC: The patient is alert and oriented x 3.  Normal affect and good eye contact. SKIN: No obvious rash, lesion, or ulcer.   LABORATORY PANEL:   CBC Recent Labs  Lab 06/07/24 2357  WBC 9.6  HGB 17.0  HCT 49.9  PLT 202   ------------------------------------------------------------------------------------------------------------------  Chemistries  Recent Labs  Lab 06/07/24 2357 06/08/24 0143  NA 139  --   K 3.5  --   CL 105  --   CO2 23  --   GLUCOSE 236*  --   BUN 12  --   CREATININE 1.48*  --   CALCIUM  8.9  --   AST  --  22  ALT  --  22  ALKPHOS  --  63  BILITOT  --  0.9    ------------------------------------------------------------------------------------------------------------------  Cardiac Enzymes No results for input(s): TROPONINI in the last 168 hours. ------------------------------------------------------------------------------------------------------------------  RADIOLOGY:  CT Angio Chest/Abd/Pel for Dissection W and/or Wo Contrast Result Date: 06/08/2024 CLINICAL DATA:  Acute onset chest pain EXAM: CT ANGIOGRAPHY CHEST, ABDOMEN AND PELVIS TECHNIQUE: Non-contrast CT of the chest was initially obtained. Multidetector CT imaging through the chest, abdomen and pelvis was performed using the standard protocol during bolus administration of intravenous contrast. Multiplanar reconstructed images and MIPs were obtained and reviewed to evaluate the vascular anatomy. RADIATION DOSE REDUCTION: This exam was performed according to the departmental dose-optimization program which includes automated exposure control, adjustment of the mA and/or kV according to patient size and/or use of iterative reconstruction technique. CONTRAST:  OMNIPAQUE  IOHEXOL  350 MG/ML SOLN COMPARISON:  Chest x-ray from earlier in the same day. FINDINGS: CTA CHEST FINDINGS Cardiovascular: Initial precontrast images show mild atherosclerotic calcification of the aorta. No aneurysmal dilatation is seen. No hyperdense crescent to suggest acute aortic injury is noted. Post-contrast images show no evidence of dissection. Heart is not significantly enlarged. The pulmonary artery shows a normal branching pattern bilaterally. No intraluminal filling defect is noted. Mediastinum/Nodes: Thoracic inlet is within normal limits. No hilar or mediastinal adenopathy is noted. The esophagus as visualized is within normal limits. Lungs/Pleura: Lungs are well aerated bilaterally. No focal infiltrate or effusion is seen. No parenchymal nodules are noted. Musculoskeletal: No acute bony abnormality is noted.  Postsurgical changes at cervicothoracic junction are seen. Review of the MIP images confirms the above findings. CTA ABDOMEN AND PELVIS FINDINGS VASCULAR Aorta: Mild atherosclerotic calcifications without aneurysmal dilatation or dissection. Celiac: Patent without evidence of aneurysm, dissection, vasculitis or significant stenosis. SMA: Patent without evidence of aneurysm, dissection, vasculitis or significant stenosis. Renals: Single renal artery is noted on the right  with dual renal arteries on the left. IMA: Patent without evidence of aneurysm, dissection, vasculitis or significant stenosis. Inflow: Iliac show no aneurysmal dilatation or dissection. Veins: No specific venous abnormality is noted Review of the MIP images confirms the above findings. NON-VASCULAR Hepatobiliary: No focal liver abnormality is seen. Status post cholecystectomy. No biliary dilatation. Pancreas: Unremarkable. No pancreatic ductal dilatation or surrounding inflammatory changes. Spleen: Normal in size without focal abnormality. Adrenals/Urinary Tract: Adrenal glands are within normal limits. Kidneys demonstrate a normal enhancement pattern bilaterally. No obstructive changes are seen. The bladder is partially distended. Stomach/Bowel: Scattered diverticular change of the colon is noted without evidence of diverticulitis. The appendix is not well visualized. No inflammatory changes to suggest appendicitis are seen. Stomach and small bowel are within normal limits. Lymphatic: No lymphadenopathy is identified. Reproductive: Prostate is unremarkable. Other: No abdominal wall hernia or abnormality. No abdominopelvic ascites. Musculoskeletal: No acute or significant osseous findings. Review of the MIP images confirms the above findings. IMPRESSION: CTA of the chest: No evidence of aortic abnormality. No pulmonary embolism identified. CTA of the abdomen and pelvis: No arterial abnormality is noted. Diverticulosis without diverticulitis.  Electronically Signed   By: Oneil Devonshire M.D.   On: 06/08/2024 01:09   DG Chest Port 1 View Result Date: 06/08/2024 CLINICAL DATA:  355200 Chest pain 355200 Patient reports sudden onset of chest pain described as pressure and sharp. The pain is centralized and radiates to back and left arm. Associated with nausea, shortness of breath, and diaphoresis. EXAM: PORTABLE CHEST 1 VIEW COMPARISON:  Cxr 05/17/24 FINDINGS: The heart and mediastinal contours are within normal limits. No focal consolidation. No pulmonary edema. No pleural effusion. No pneumothorax. No acute osseous abnormality. Cervicothoracic spine surgical hardware. IMPRESSION: No active disease. Electronically Signed   By: Morgane  Naveau M.D.   On: 06/08/2024 00:20      IMPRESSION AND PLAN:  Assessment and Plan: * Chest pain - He has been having a troponin delta concerning for developing non-STEMI. - She will be admitted to a progressive unit observation bed. - Will follow serial troponins. - He will be placed on aspirin  as well as  iv morphine  sulfate for pain. - High-dose statin therapy would be provide as well as beta-blocker therapy. - Will place on IV heparin . - Will continue Ranexa  - Cardiology consult to be obtained. - [facility] group was notified about the patient.  Hypertensive urgency - He was placed on IV nitroglycerin  drip with improvement of his blood pressure. - His antihypertensive therapy will be resumed.  Dyslipidemia - Will continue statin therapy as well as Zetia .  Type 2 diabetes mellitus with peripheral neuropathy (HCC) -We will place the patient on supplemental coverage with NovoLog . - Will continue basal coverage as well as Jardiance . - Will continue Lyrica .  Gout - Will continue allopurinol .    DVT prophylaxis: IV heparin . Advanced Care Planning:  Code Status: full code. Family Communication:  The plan of care was discussed in details with the patient (and family). I answered all questions. The  patient agreed to proceed with the above mentioned plan. Further management will depend upon hospital course. Disposition Plan: Back to previous home environment Consults called: Cardiology All the records are reviewed and case discussed with ED provider.  Status is: Observation  I certify that at the time of admission, it is my clinical judgment that the patient will require hospital care extending less than 2 midnights.  Dispo: The patient is from: Home              Anticipated d/c is to: Home              Patient currently is not medically stable to d/c.              Difficult to place patient: No  [place] DELENA Peaches M.D on 06/08/2024 at 6:46 AM  [facility]   From 7 PM-7 AM, contact night-coverage [email or web address]  CC: Primary care physician; Leavy Mole, PA-C

## 2024-06-08 NOTE — Assessment & Plan Note (Addendum)
-  We will place the patient on supplemental coverage with NovoLog . - Will continue basal coverage as well as Jardiance . - Will continue Lyrica .

## 2024-06-08 NOTE — Hospital Course (Signed)
 Cory Rivas is a 58 y.o. Obese Caucasian male with medical history significant for stage III chronic kidney disease, COPD, type 2 diabetes mellitus with peripheral neuropathy, hypertension, dyslipidemia, OSA, gout and coronary artery disease, who presented to the emergency room with acute onset of central chest pain.  Peak troponin 47. Recent heart cath 10/30/2023 showed moderate LAD and OM1 disease, not significantly Different from 05/2023.  Patient was loaded with aspirin , started on heparin  infusion.  Followed by cardiology.

## 2024-06-08 NOTE — Assessment & Plan Note (Signed)
-   Will continue statin therapy as well as Zetia .

## 2024-06-08 NOTE — Consult Note (Signed)
 Cardiology Consultation   Patient ID: Cory Rivas MRN: [id number]; DOB: 11-04-1965  Admit date: 06/08/2024 Date of Consult: 06/08/2024  PCP:  Leavy Mole, PA-C   [facility] Providers Cardiologist:  Redell Cave, MD        Patient Profile: Cory Rivas is a 58 y.o. male with a hx of moderate nonobstructive CAD, chest pain, chronic HFpEF, stage IIIa chronic kidney disease, insulin -dependent diabetes, hypertension, hyperlipidemia, morbid obesity, tobacco abuse, COPD, gout, sleep apnea, tracheobronchomalacia, and cervical myelopathy who is being seen 06/08/2024 for the evaluation of chest pain at the request of Dr. Lawence.  History of Present Illness:   Cory Rivas cardiac history dates back to 2021 when he underwent stress testing 09/2020 as a part of preoperative evaluation which was low risk but did show coronary calcification on CT imaging.  Echo 10/2020 showed EF 60 to 65% with G1 DD.  He was hospitalized at [facility] 05/2023 in the setting of chest pain, dyspnea, and hypertensive urgency.  Troponin was mildly elevated and peaked at 26.  Echo showed EF 50 to 55% without RWMA, moderate LVH, G3 DD.  Diagnostic catheterization revealed moderate nonobstructive CAD including sequential 50 to 60% ostial proximal LAD stenosis that were not hemodynamically significant by RFR (0.93).  Medical therapy and ongoing diuresis were recommended.  He was readmitted in 08/2023 for hypertensive urgency and HFpEF requiring diuresis.  He was subsequently treated for pneumonia in 08/2023.  In 10/2023 he was admitted to [facility] with respiratory failure, COPD exacerbation, HFpEF, and acute on chronic stage III kidney disease.  He required diuresis, steroids, nebulizers, and antibiotics.  Following discharge, he reported ongoing chest discomfort office follow-up and underwent diagnostic catheterization 10/2023 showing overall stable anatomy with recommendation for escalation of medical therapy (amlodipine   increased to 10 mg daily).  He was again seen in the ED 12/2023 and admitted in 01/2024 with atypical chest pain, mild troponin elevation, COPD, and HFpEF.  He was admitted again in 03/2024 in the setting of acute on chronic HFpEF and chest pressure.  Troponins were again mildly elevated with flat trend.  He was diuresed and subsequently discharged.  He was again hospitalized 05/17/2024 in the setting of chest discomfort.  At that time, he reported almost daily retrosternal chest discomfort occurring at rest and with activity.  Troponins were mildly elevated and flat trending.  Echo was ordered but not completed as patient requested to be discharged home.  He was started on Ranexa  for further management of angina.  He was discharged home in stable condition on 8/3.    Patient reports yesterday evening he was lying in bed prior to going to sleep for the evening when he started to experience chest discomfort.  He describes this as a pressure-like pain although also sharp and stabbing at times.  This radiated up into his neck and down his left arm.  He also reports tenderness in his lower chest.  He reports associated dyspnea and diaphoresis at that time.  He took 4 baby aspirin  without relief of pain.  He has chronic intermittent episodes of chest discomfort but this 1 felt more severe and persisted longer than others, prompting him to report to the ED. In the ED, BP 198/122, RR 24 with otherwise normal vital signs.  Pertinent labs include glucose 236, creatinine 1.48.  Troponin 19 > 47.  Chest x-ray negative.  CTA of the chest without aortic abnormality, no PE.  EKG shows sinus rhythm with inferolateral ST/T wave abnormality.  Cardiology  was asked to consult for further evaluation of chest pain. At time of cardiology consult, patient reports significant improvement in chest discomfort.  He reports some tenderness still in his lower chest, which is reproducible with palpation.  He reports that prior to this episode  yesterday, he had not had any episodes of chest pain in over a month.  He denies dyspnea on exertion, lightheadedness, dizziness, palpitations, and lower extremity swelling.  Past Medical History:  Diagnosis Date   Bulging of cervical intervertebral disc    CAD (coronary artery disease)    a. 09/2020 MV: low risk; b. 05/2023 Cath: Moderate LAD dzs w/ seq 50-60% stenoses and nl RFR 0.93. Mod OM dzs->Med rx; b. 10/2023 Cath: LM min irregs, LAD 55ost, 55p/m, LCX large nl, OM1 50, RCA mod, nl.   Cervical myelopathy (HCC)    Chronic heart failure with preserved ejection fraction (HFpEF) (HCC)    a. 05/2023 Echo: EF 50-55%, no rwma, GrIII DD, nl RV fxn, mild MR, Ao sclerosis; b. 10/2023 Echo: EF 55-60%, no rwma, nl RV fxn, mild MR, Ao sclerosis.   CKD (chronic kidney disease), stage III (HCC)    COPD (chronic obstructive pulmonary disease) (HCC)    Diabetes mellitus without complication (HCC)    Gout    Hyperlipidemia LDL goal <70    Hypertension    Morbid obesity (HCC)    OSA (obstructive sleep apnea)    Stress due to family tension 04/15/2019   Tracheobronchomalacia     Past Surgical History:  Procedure Laterality Date   BACK SURGERY     CARDIAC CATHETERIZATION     CHOLECYSTECTOMY     COLONOSCOPY WITH PROPOFOL  N/A 03/10/2020   Procedure: COLONOSCOPY WITH PROPOFOL ;  Surgeon: Jinny Carmine, MD;  Location: [facility] ENDOSCOPY;  Service: Endoscopy;  Laterality: N/A;   COLONOSCOPY WITH PROPOFOL  N/A 09/25/2023   Procedure: COLONOSCOPY WITH PROPOFOL ;  Surgeon: Jinny Carmine, MD;  Location: [facility] ENDOSCOPY;  Service: Endoscopy;  Laterality: N/A;   CORONARY PRESSURE/FFR STUDY N/A 05/29/2023   Procedure: CORONARY PRESSURE/FFR STUDY;  Surgeon: Mady Bruckner, MD;  Location: [facility] INVASIVE CV LAB;  Service: Cardiovascular;  Laterality: N/A;   POLYPECTOMY  09/25/2023   Procedure: POLYPECTOMY;  Surgeon: Jinny Carmine, MD;  Location: [facility] ENDOSCOPY;  Service: Endoscopy;;   RIGHT/LEFT HEART CATH AND CORONARY  ANGIOGRAPHY N/A 05/29/2023   Procedure: RIGHT/LEFT HEART CATH AND CORONARY ANGIOGRAPHY;  Surgeon: Mady Bruckner, MD;  Location: [facility] INVASIVE CV LAB;  Service: Cardiovascular;  Laterality: N/A;   RIGHT/LEFT HEART CATH AND CORONARY ANGIOGRAPHY Bilateral 10/30/2023   Procedure: RIGHT/LEFT HEART CATH AND CORONARY ANGIOGRAPHY;  Surgeon: Mady Bruckner, MD;  Location: [facility] INVASIVE CV LAB;  Service: Cardiovascular;  Laterality: Bilateral;       Scheduled Meds:  allopurinol   100 mg Oral Daily   amLODipine   10 mg Oral Daily   [START ON 06/09/2024] aspirin  EC  81 mg Oral Daily   bisoprolol   10 mg Oral Daily   DULoxetine   60 mg Oral BH-q7a   And   DULoxetine   30 mg Oral QPM   empagliflozin   10 mg Oral Daily   ezetimibe   10 mg Oral Daily   fluticasone   1 spray Each Nare Daily   insulin  aspart  0-15 Units Subcutaneous TID WC   insulin  aspart  0-5 Units Subcutaneous QHS   insulin  glargine  20 Units Subcutaneous QHS   loratadine   10 mg Oral Daily   pantoprazole   40 mg Oral Daily   potassium chloride   40 mEq Oral  Once   ranolazine   500 mg Oral BID   rosuvastatin   40 mg Oral Daily   sucralfate   1 g Oral TID WC & HS   umeclidinium bromide   1 puff Inhalation Daily   Continuous Infusions:  sodium chloride  100 mL/hr at 06/08/24 0415   heparin  1,150 Units/hr (06/08/24 0410)   nitroGLYCERIN  50 mcg/min (06/08/24 0218)   PRN Meds: acetaminophen , ALPRAZolam , baclofen , cromolyn , ipratropium-albuterol , magnesium  hydroxide, morphine  injection, nicotine  polacrilex, nitroGLYCERIN , ondansetron  (ZOFRAN ) IV, pregabalin , traZODone   Allergies:    Allergies  Allergen Reactions   Oxycodone  Nausea And Vomiting   Entresto  [Sacubitril -Valsartan ] Nausea Only and Other (See Comments)    Lightheaded and dizzy    Onion Nausea And Vomiting   Nitroglycerin  Itching and Rash    IV product, patient says SL tablet is fine    Social History:   Social History   Socioeconomic History   Marital status: Divorced     Spouse name: Not on file   Number of children: 0   Years of education: 9   Highest education level: 9th grade  Occupational History   Occupation: disability  Tobacco Use   Smoking status: Every Day    Current packs/day: 0.00    Average packs/day: 1 pack/day for 37.0 years (37.0 ttl pk-yrs)    Types: Cigarettes    Start date: 10/19/1989    Last attempt to quit: 08/20/2022    Years since quitting: 1.8   Smokeless tobacco: Never   Tobacco comments:    30+ years hx smoking 2 ppd, stopped smoking briefly in April 2022 for a surgery and then restarted smoking.  As of August 2025, he smoking about 2 to 4 cigarettes a day.  Vaping Use   Vaping status: Never Used  Substance and Sexual Activity   Alcohol use: No   Drug use: No   Sexual activity: Not Currently    Partners: Female  Other Topics Concern   Not on file  Social History Narrative   Lives with sister [name], brother in law [name]), and his mother - Saurabh Hettich   Does not routinely exercise.   Social Drivers of Health   Financial Resource Strain: High Risk (01/21/2024)   Overall Financial Resource Strain (CARDIA)    Difficulty of Paying Living Expenses: Hard  Food Insecurity: No Food Insecurity (04/04/2024)   Hunger Vital Sign    Worried About Running Out of Food in the Last Year: Never true    Ran Out of Food in the Last Year: Never true  Transportation Needs: No Transportation Needs (04/04/2024)   PRAPARE - Administrator, Civil Service (Medical): No    Lack of Transportation (Non-Medical): No  Physical Activity: Insufficiently Active (12/06/2022)   Exercise Vital Sign    Days of Exercise per Week: 3 days    Minutes of Exercise per Session: 10 min  Stress: No Stress Concern Present (12/06/2022)   [employer] of Occupational Health - Occupational Stress Questionnaire    Feeling of Stress : Only a little  Social Connections: Socially Isolated (02/17/2024)   Social Connection and Isolation Panel    Frequency of  Communication with Friends and Family: More than three times a week    Frequency of Social Gatherings with Friends and Family: Twice a week    Attends Religious Services: Never    Database administrator or Organizations: No    Attends Banker Meetings: Never    Marital Status: Divorced  Catering manager Violence: Not At  Risk (04/04/2024)   Humiliation, Afraid, Rape, and Kick questionnaire    Fear of Current or Ex-Partner: No    Emotionally Abused: No    Physically Abused: No    Sexually Abused: No    Family History:    Family History  Problem Relation Age of Onset   Heart disease Mother    Diabetes Mother    Hyperlipidemia Mother    Hypertension Mother    Heart attack Mother    Lung cancer Father    Hypertension Father    Stroke Father    AAA (abdominal aortic aneurysm) Maternal Grandmother    Heart attack Maternal Grandmother    Diabetes Maternal Grandfather      ROS:  Please see the history of present illness.   All other ROS reviewed and negative.     Physical Exam/Data: Vitals:   06/08/24 0500 06/08/24 0600 06/08/24 0700 06/08/24 0805  BP: (!) 149/88 128/87 (!) 145/90   Pulse: 98 73 76   Resp:   18   Temp:    98.2 F (36.8 C)  TempSrc:    Oral  SpO2: 94% 94% 94%   Weight:      Height:       No intake or output data in the 24 hours ending 06/08/24 0847    06/07/2024   11:58 PM 05/29/2024    2:43 PM 05/17/2024   12:42 AM  Last 3 Weights  Weight (lbs) 220 lb 225 lb 227 lb  Weight (kg) 99.791 kg 102.059 kg 102.967 kg     Body mass index is 34.46 kg/m.  General:  Well nourished, well developed, in no acute distress HEENT: normal Neck: Unable to assess JVD due to body habitus Vascular: No carotid bruits; Distal pulses 2+ bilaterally Cardiac:  normal S1, S2; RRR; 2/6 systolic murmur Lungs:  clear to auscultation bilaterally, no wheezing, rhonchi or rales  Abd: soft, nontender, no hepatomegaly  Ext: no edema Skin: warm and dry  Psych:  Normal  affect   EKG:  The EKG was personally reviewed and demonstrates: Sinus rhythm with inferolateral ST/T wave abnormality, rate 96 bpm. Telemetry:  Telemetry was personally reviewed and demonstrates:  normal sinus rhythm  Relevant CV Studies:  10/2023 Echo complete 1. Left ventricular ejection fraction, by estimation, is 55 to 60%. The  left ventricle has normal function. The left ventricle has no regional  wall motion abnormalities. Left ventricular diastolic parameters were  normal. The average left ventricular  global longitudinal strain is -13.3 %.   2. Right ventricular systolic function is normal. The right ventricular  size is normal. Tricuspid regurgitation signal is inadequate for assessing  PA pressure.   3. The mitral valve is normal in structure. Mild mitral valve  regurgitation. No evidence of mitral stenosis.   4. The aortic valve is normal in structure. There is mild calcification  of the aortic valve. Aortic valve regurgitation is not visualized. Aortic  valve sclerosis is present, with no evidence of aortic valve stenosis.   5. The inferior vena cava is normal in size with greater than 50%  respiratory variability, suggesting right atrial pressure of 3 mmHg.   10/2023 R/LHC Moderate LAD and OM1 disease, not significantly different compared to prior catheterization in 05/2023.  RFR of sequential ostial and proximal LAD stenosis at that time was not hemodynamically significant. Normal left and right heart filling pressures. Mild pulmonary hypertension. Normal Fick cardiac output/index.  Laboratory Data: High Sensitivity Troponin:   Recent Labs  Lab  05/17/24 0306 05/17/24 0944 05/17/24 1142 06/07/24 2357 06/08/24 0143  TROPONINIHS 59* 65* 64* 19* 47*     Chemistry Recent Labs  Lab 06/04/24 1533 06/07/24 2357 06/08/24 0622  NA 135 139 140  K 3.5 3.5 3.5  CL 97* 105 102  CO2 23 23 24   GLUCOSE 221* 236* 242*  BUN 13 12 12   CREATININE 1.23 1.48* 1.30*   CALCIUM  9.3 8.9 8.5*  GFRNONAA >60 55* >60  ANIONGAP 15 11 14     Recent Labs  Lab 06/04/24 1533 06/08/24 0143  PROT 7.3 6.6  ALBUMIN 3.8 3.6  AST 26 22  ALT 20 22  ALKPHOS 65 63  BILITOT 1.0 0.9   Lipids  Recent Labs  Lab 06/08/24 0622  CHOL 181  TRIG 208*  HDL 38*  LDLCALC 101*  CHOLHDL 4.8    Hematology Recent Labs  Lab 06/04/24 1533 06/07/24 2357 06/08/24 0622  WBC 10.3 9.6 9.3  RBC 5.90* 5.85* 4.99  HGB 17.1* 17.0 14.6  HCT 49.0 49.9 42.5  MCV 83.1 85.3 85.2  MCH 29.0 29.1 29.3  MCHC 34.9 34.1 34.4  RDW 13.2 13.2 13.2  PLT 199 202 175   Thyroid No results for input(s): TSH, FREET4 in the last 168 hours.  BNPNo results for input(s): BNP, PROBNP in the last 168 hours.  DDimer No results for input(s): DDIMER in the last 168 hours.  Radiology/Studies:  CT Angio Chest/Abd/Pel for Dissection W and/or Wo Contrast Result Date: 06/08/2024 IMPRESSION: CTA of the chest: No evidence of aortic abnormality. No pulmonary embolism identified. CTA of the abdomen and pelvis: No arterial abnormality is noted. Diverticulosis without diverticulitis. Electronically Signed   By: Oneil Devonshire M.D.   On: 06/08/2024 01:09   DG Chest Port 1 View Result Date: 06/08/2024 IMPRESSION: No active disease. Electronically Signed   By: Morgane  Naveau M.D.   On: 06/08/2024 00:20   CT ABDOMEN PELVIS W CONTRAST Result Date: 06/04/2024 IMPRESSION: No evidence of acute pancreatitis. Hepatic steatosis. Scattered aortic atherosclerosis. Sigmoid diverticulosis. Electronically Signed   By: Franky Crease M.D.   On: 06/04/2024 19:28   Assessment and Plan:  Coronary artery disease Unstable angina Elevated troponin - History of chronic chest pain with most recent cardiac catheterization 10/2023 showing stable moderate LAD and obtuse marginal disease with normal RFR - Presenting with chest pressure and stabbing pain with associated dyspnea and diaphoresis - Troponin mildly elevated,  continue to trend - Started on heparin  and nitroglycerin  drip in the ED - Nitro previously listed as allergy but patient tolerating this admission - Chest pain has mostly resolved, now with reproducible tenderness in the lower chest/upper abdomen - EKG with nonspecific ST/T wave abnormalities - Check ESR and CRP - Repeat echo ordered to assess for RWMA with further recommendations pending results - Continue Ranexa , BB, ASA, and statin therapy  Chronic HFpEF - Most recent echo 10/2023 with EF 55-60% - Appears euvolemic on exam - Repeat echo pending - Recommend continuing PTA ARB, BB, spironolactone , torsemide , and SGLT2i  Hypertension - BP elevated on presentation, now improving - Recommend continuation of PTA amlodipine , bisoprolol , irbesartan , spironolactone , and torsemide   Hyperlipidemia - LDL 101 - He is continued on statin and ezetimibe  - Would likely benefit from PCSK9i in the outpatient setting  CKD IIIa - Cr 1.3, appears near baseline  Tobacco use - Recommend cessation   Risk Assessment/Risk Scores:    TIMI Risk Score for Unstable Angina or Non-ST Elevation MI:   The patient's TIMI risk  score is  , which indicates a  % risk of all cause mortality, new or recurrent myocardial infarction or need for urgent revascularization in the next 14 days.  For questions or updates, please contact [facility] Please consult [email or web address] for contact info under    Signed, Lesley LITTIE Maffucci, PA-C  06/08/2024 8:47 AM

## 2024-06-08 NOTE — ED Notes (Signed)
 Gave pt warm blankets and a cup of water.

## 2024-06-08 NOTE — ED Notes (Signed)
 Patient transported to CT

## 2024-06-08 NOTE — Progress Notes (Signed)
*  PRELIMINARY RESULTS* Echocardiogram 2D Echocardiogram has been performed.  Floydene Harder 06/08/2024, 9:52 AM

## 2024-06-08 NOTE — Progress Notes (Signed)
 PHARMACY - ANTICOAGULATION CONSULT NOTE  Pharmacy Consult for Heparin  Infusion Indication: chest pain/ACS  Patient Measurements: Height: 5' 7 (170.2 cm) Weight: 99.8 kg (220 lb) IBW/kg (Calculated) : 66.1 HEPARIN  DW (KG): 87.8  Labs: Recent Labs    06/07/24 2357 06/08/24 0143 06/08/24 0622 06/08/24 0936  HGB 17.0  --  14.6  --   HCT 49.9  --  42.5  --   PLT 202  --  175  --   LABPROT  --   --  14.5  --   INR  --   --  1.1  --   HEPARINUNFRC  --   --   --  0.18*  CREATININE 1.48*  --  1.30*  --   TROPONINIHS 19* 47*  --   --     Estimated Creatinine Clearance: 70.6 mL/min (A) (by C-G formula based on SCr of 1.3 mg/dL (H)).   Medical History: Past Medical History:  Diagnosis Date   Bulging of cervical intervertebral disc    CAD (coronary artery disease)    a. 09/2020 MV: low risk; b. 05/2023 Cath: Moderate LAD dzs w/ seq 50-60% stenoses and nl RFR 0.93. Mod OM dzs->Med rx; b. 10/2023 Cath: LM min irregs, LAD 55ost, 55p/m, LCX large nl, OM1 50, RCA mod, nl.   Cervical myelopathy (HCC)    Chronic heart failure with preserved ejection fraction (HFpEF) (HCC)    a. 05/2023 Echo: EF 50-55%, no rwma, GrIII DD, nl RV fxn, mild MR, Ao sclerosis; b. 10/2023 Echo: EF 55-60%, no rwma, nl RV fxn, mild MR, Ao sclerosis.   CKD (chronic kidney disease), stage III (HCC)    COPD (chronic obstructive pulmonary disease) (HCC)    Diabetes mellitus without complication (HCC)    Gout    Hyperlipidemia LDL goal <70    Hypertension    Morbid obesity (HCC)    OSA (obstructive sleep apnea)    Stress due to family tension 04/15/2019   Tracheobronchomalacia     Assessment: Patient is a 58yo male presenting with chest pain. Pharmacy consulted for Heparin  dosing. No prior anticoagulants noted in history.  0825 0936 HL 0.18, subthera; 1150 un/hr  Goal of Therapy:  Heparin  level 0.3-0.7 units/ml Monitor platelets by anticoagulation protocol: Yes   Plan:  --Heparin  2500 unit IV bolus and  increase infusion rate to 1400 units/hr --Re-check HL in 6 hours --Daily CBC per protocol while on IV heparin   Marolyn KATHEE Mare 06/08/2024 10:34 AM

## 2024-06-08 NOTE — Assessment & Plan Note (Signed)
Will continue allopurinol. 

## 2024-06-08 NOTE — Assessment & Plan Note (Signed)
-   He was placed on IV nitroglycerin  drip with improvement of his blood pressure. - His antihypertensive therapy will be resumed.

## 2024-06-08 NOTE — Progress Notes (Signed)
 PHARMACY - ANTICOAGULATION CONSULT NOTE  Pharmacy Consult for Heparin  Infusion Indication: chest pain/ACS  Allergies  Allergen Reactions   Oxycodone  Nausea And Vomiting   Entresto  [Sacubitril -Valsartan ] Nausea Only and Other (See Comments)    Lightheaded and dizzy    Onion Nausea And Vomiting   Nitroglycerin  Itching and Rash    IV product, patient says SL tablet is fine    Patient Measurements: Height: 5' 7 (170.2 cm) Weight: 99.8 kg (220 lb) IBW/kg (Calculated) : 66.1 HEPARIN  DW (KG): 87.8  Vital Signs: Temp: 98.5 F (36.9 C) (08/25 0400) Temp Source: Oral (08/25 0400) BP: 139/86 (08/25 0400) Pulse Rate: 85 (08/25 0400)  Labs: Recent Labs    06/07/24 2357 06/08/24 0143  HGB 17.0  --   HCT 49.9  --   PLT 202  --   CREATININE 1.48*  --   TROPONINIHS 19* 47*    Estimated Creatinine Clearance: 62 mL/min (A) (by C-G formula based on SCr of 1.48 mg/dL (H)).   Medical History: Past Medical History:  Diagnosis Date   Bulging of cervical intervertebral disc    CAD (coronary artery disease)    a. 09/2020 MV: low risk; b. 05/2023 Cath: Moderate LAD dzs w/ seq 50-60% stenoses and nl RFR 0.93. Mod OM dzs->Med rx; b. 10/2023 Cath: LM min irregs, LAD 55ost, 55p/m, LCX large nl, OM1 50, RCA mod, nl.   Cervical myelopathy (HCC)    Chronic heart failure with preserved ejection fraction (HFpEF) (HCC)    a. 05/2023 Echo: EF 50-55%, no rwma, GrIII DD, nl RV fxn, mild MR, Ao sclerosis; b. 10/2023 Echo: EF 55-60%, no rwma, nl RV fxn, mild MR, Ao sclerosis.   CKD (chronic kidney disease), stage III (HCC)    COPD (chronic obstructive pulmonary disease) (HCC)    Diabetes mellitus without complication (HCC)    Gout    Hyperlipidemia LDL goal <70    Hypertension    Morbid obesity (HCC)    OSA (obstructive sleep apnea)    Stress due to family tension 04/15/2019   Tracheobronchomalacia     Assessment: Patient is a 58yo male presenting with chest pain. Pharmacy consulted for Heparin   dosing. No prior anticoagulants noted in history.  Goal of Therapy:  Heparin  level 0.3-0.7 units/ml Monitor platelets by anticoagulation protocol: Yes   Plan:  Give 4000 units bolus x 1 Start heparin  infusion at 1150 units/hr Check anti-Xa level in 6 hours and daily while on heparin  Continue to monitor H&H and platelets  Olam Fritter, PharmD, BCPS 06/08/2024 4:30 AM

## 2024-06-08 NOTE — ED Provider Notes (Signed)
 [facility] Provider Note    Event Date/Time   First MD Initiated Contact with Patient 06/08/24 0010     (approximate)   History   Chest Pain   HPI  BETTY BROOKS is a 58 year old male with history of T2DM, CHF, CAD presenting to the emergency department for evaluation of chest pain.  Patient reports that about 30 minutes prior to presentation he had onset of chest pain described as a tightness in the center of his chest with radiation to his left arm and back.  Took 4 baby aspirin .  Has been seen for chest pain multiple times previously, says that the pain is worse than he has previously experienced.  Patient does report ongoing chest pain currently reported as an 8 out of 10.  I reviewed his discharge summary from 06/13/2024.  At that time patient presented with chest pain.  Had flat troponins.  Cardiology was consulted with medication adjustments made.  I did review his heart catheterization from January of this year.  Demonstrated moderate disease of the LAD and OM1 without significant change from August 2024.      Physical Exam   Triage Vital Signs: ED Triage Vitals  Encounter Vitals Group     BP 06/07/24 2359 (!) 198/122     Girls Systolic BP Percentile --      Girls Diastolic BP Percentile --      Boys Systolic BP Percentile --      Boys Diastolic BP Percentile --      Pulse Rate 06/07/24 2359 98     Resp 06/07/24 2359 (!) 24     Temp 06/07/24 2359 97.9 F (36.6 C)     Temp Source 06/07/24 2359 Oral     SpO2 06/07/24 2359 97 %     Weight 06/07/24 2358 220 lb (99.8 kg)     Height 06/07/24 2358 5' 7 (1.702 m)     Head Circumference --      Peak Flow --      Pain Score 06/08/24 0027 8     Pain Loc --      Pain Education --      Exclude from Growth Chart --     Most recent vital signs: Vitals:   06/08/24 0330 06/08/24 0345  BP: (!) 147/106   Pulse:  85  Resp:    Temp:    SpO2:  96%     General: Awake, interactive   CV:  Regular rate, good peripheral perfusion.  Resp:  Unlabored respirations, lungs clear to auscultation Abd:  Nondistended, soft, nontender Neuro:  Symmetric facial movement, fluid speech   ED Results / Procedures / Treatments   Labs (all labs ordered are listed, but only abnormal results are displayed) Labs Reviewed  BASIC METABOLIC PANEL WITH GFR - Abnormal; Notable for the following components:      Result Value   Glucose, Bld 236 (*)    Creatinine, Ser 1.48 (*)    GFR, Estimated 55 (*)    All other components within normal limits  CBC - Abnormal; Notable for the following components:   RBC 5.85 (*)    All other components within normal limits  LIPASE, BLOOD - Abnormal; Notable for the following components:   Lipase 52 (*)    All other components within normal limits  TROPONIN I (HIGH SENSITIVITY) - Abnormal; Notable for the following components:   Troponin I (High Sensitivity) 19 (*)    All other components within normal  limits  TROPONIN I (HIGH SENSITIVITY) - Abnormal; Notable for the following components:   Troponin I (High Sensitivity) 47 (*)    All other components within normal limits  HEPATIC FUNCTION PANEL  LIPOPROTEIN A (LPA)  BASIC METABOLIC PANEL WITH GFR  LIPID PANEL  CBC  PROTIME-INR     EKG EKG independently reviewed and interpreted by myself demonstrates:  Initial EKG demonstrates sinus rhythm rate 97, PR 171, QRS 87, QTc 423, nonspecific ST changes noted, no STEMI Repeat from 0049 demonstrates sinus rhythm at 93, PR 179, QRS 89, QTc 416, stable nonspecific ST changes Repeat from 0113 demonstrates sinus rhythm at 96, PR 126, QRS 85, QTc 420, remains with nonspecific ST changes, no STEMI  Compared to prior from 06/04/2024, ST depressions in inferior lateral leads do seem more prominent.  RADIOLOGY Imaging independently reviewed and interpreted by myself demonstrates:  CTA dissection protocol fortunately without evidence of aortic dissection CXR  without focal consolidation  Formal Radiology Read:  CT Angio Chest/Abd/Pel for Dissection W and/or Wo Contrast Result Date: 06/08/2024 CLINICAL DATA:  Acute onset chest pain EXAM: CT ANGIOGRAPHY CHEST, ABDOMEN AND PELVIS TECHNIQUE: Non-contrast CT of the chest was initially obtained. Multidetector CT imaging through the chest, abdomen and pelvis was performed using the standard protocol during bolus administration of intravenous contrast. Multiplanar reconstructed images and MIPs were obtained and reviewed to evaluate the vascular anatomy. RADIATION DOSE REDUCTION: This exam was performed according to the departmental dose-optimization program which includes automated exposure control, adjustment of the mA and/or kV according to patient size and/or use of iterative reconstruction technique. CONTRAST:  OMNIPAQUE  IOHEXOL  350 MG/ML SOLN COMPARISON:  Chest x-Adelard Sanon from earlier in the same day. FINDINGS: CTA CHEST FINDINGS Cardiovascular: Initial precontrast images show mild atherosclerotic calcification of the aorta. No aneurysmal dilatation is seen. No hyperdense crescent to suggest acute aortic injury is noted. Post-contrast images show no evidence of dissection. Heart is not significantly enlarged. The pulmonary artery shows a normal branching pattern bilaterally. No intraluminal filling defect is noted. Mediastinum/Nodes: Thoracic inlet is within normal limits. No hilar or mediastinal adenopathy is noted. The esophagus as visualized is within normal limits. Lungs/Pleura: Lungs are well aerated bilaterally. No focal infiltrate or effusion is seen. No parenchymal nodules are noted. Musculoskeletal: No acute bony abnormality is noted. Postsurgical changes at cervicothoracic junction are seen. Review of the MIP images confirms the above findings. CTA ABDOMEN AND PELVIS FINDINGS VASCULAR Aorta: Mild atherosclerotic calcifications without aneurysmal dilatation or dissection. Celiac: Patent without evidence of  aneurysm, dissection, vasculitis or significant stenosis. SMA: Patent without evidence of aneurysm, dissection, vasculitis or significant stenosis. Renals: Single renal artery is noted on the right with dual renal arteries on the left. IMA: Patent without evidence of aneurysm, dissection, vasculitis or significant stenosis. Inflow: Iliac show no aneurysmal dilatation or dissection. Veins: No specific venous abnormality is noted Review of the MIP images confirms the above findings. NON-VASCULAR Hepatobiliary: No focal liver abnormality is seen. Status post cholecystectomy. No biliary dilatation. Pancreas: Unremarkable. No pancreatic ductal dilatation or surrounding inflammatory changes. Spleen: Normal in size without focal abnormality. Adrenals/Urinary Tract: Adrenal glands are within normal limits. Kidneys demonstrate a normal enhancement pattern bilaterally. No obstructive changes are seen. The bladder is partially distended. Stomach/Bowel: Scattered diverticular change of the colon is noted without evidence of diverticulitis. The appendix is not well visualized. No inflammatory changes to suggest appendicitis are seen. Stomach and small bowel are within normal limits. Lymphatic: No lymphadenopathy is identified. Reproductive: Prostate is unremarkable.  Other: No abdominal wall hernia or abnormality. No abdominopelvic ascites. Musculoskeletal: No acute or significant osseous findings. Review of the MIP images confirms the above findings. IMPRESSION: CTA of the chest: No evidence of aortic abnormality. No pulmonary embolism identified. CTA of the abdomen and pelvis: No arterial abnormality is noted. Diverticulosis without diverticulitis. Electronically Signed   By: Oneil Devonshire M.D.   On: 06/08/2024 01:09   DG Chest Port 1 View Result Date: 06/08/2024 CLINICAL DATA:  355200 Chest pain 355200 Patient reports sudden onset of chest pain described as pressure and sharp. The pain is centralized and radiates to back and  left arm. Associated with nausea, shortness of breath, and diaphoresis. EXAM: PORTABLE CHEST 1 VIEW COMPARISON:  Cxr 05/17/24 FINDINGS: The heart and mediastinal contours are within normal limits. No focal consolidation. No pulmonary edema. No pleural effusion. No pneumothorax. No acute osseous abnormality. Cervicothoracic spine surgical hardware. IMPRESSION: No active disease. Electronically Signed   By: Morgane  Naveau M.D.   On: 06/08/2024 00:20    PROCEDURES:  Critical Care performed: Yes, see critical care procedure note(s)  CRITICAL CARE Performed by: Nilsa Dade   Total critical care time: 32 minutes  Critical care time was exclusive of separately billable procedures and treating other patients.  Critical care was necessary to treat or prevent imminent or life-threatening deterioration.  Critical care was time spent personally by me on the following activities: development of treatment plan with patient and/or surrogate as well as nursing, discussions with consultants, evaluation of patient's response to treatment, examination of patient, obtaining history from patient or surrogate, ordering and performing treatments and interventions, ordering and review of laboratory studies, ordering and review of radiographic studies, pulse oximetry and re-evaluation of patient's condition.   Procedures   MEDICATIONS ORDERED IN ED: Medications  nitroGLYCERIN  (NITROSTAT ) SL tablet 0.4 mg (0.4 mg Sublingual Given 06/08/24 0025)  nitroGLYCERIN  50 mg in dextrose 5 % 250 mL (0.2 mg/mL) infusion (50 mcg/min Intravenous Rate/Dose Change 06/08/24 0218)  allopurinol  (ZYLOPRIM ) tablet 100 mg (has no administration in time range)  amLODipine  (NORVASC ) tablet 10 mg (has no administration in time range)  bisoprolol  (ZEBETA ) tablet 10 mg (has no administration in time range)  ezetimibe  (ZETIA ) tablet 10 mg (has no administration in time range)  ranolazine  (RANEXA ) 12 hr tablet 500 mg (has no administration in  time range)  rosuvastatin  (CRESTOR ) tablet 40 mg (has no administration in time range)  DULoxetine  (CYMBALTA ) DR capsule 60 mg (has no administration in time range)    And  DULoxetine  (CYMBALTA ) DR capsule 30 mg (has no administration in time range)  nicotine  polacrilex (NICORETTE ) gum 4 mg (has no administration in time range)  empagliflozin  (JARDIANCE ) tablet 10 mg (has no administration in time range)  insulin  glargine-yfgn (SEMGLEE ) injection 20 Units (has no administration in time range)  sucralfate  (CARAFATE ) tablet 1 g (has no administration in time range)  pantoprazole  (PROTONIX ) EC tablet 40 mg (has no administration in time range)  baclofen  (LIORESAL ) tablet 10 mg (has no administration in time range)  pregabalin  (LYRICA ) capsule 50 mg (has no administration in time range)  fluticasone  (FLONASE ) 50 MCG/ACT nasal spray 1 spray (has no administration in time range)  loratadine  (CLARITIN ) tablet 10 mg (has no administration in time range)  ipratropium-albuterol  (DUONEB) 0.5-2.5 (3) MG/3ML nebulizer solution 3 mL (has no administration in time range)  Tiotropium Bromide  Monohydrate AERS 2 puff (has no administration in time range)  cromolyn  (OPTICROM ) 4 % ophthalmic solution 1 drop (has no administration  in time range)  aspirin  chewable tablet 324 mg (has no administration in time range)    Or  aspirin  suppository 300 mg (has no administration in time range)  aspirin  EC tablet 81 mg (has no administration in time range)  acetaminophen  (TYLENOL ) tablet 650 mg (has no administration in time range)  ondansetron  (ZOFRAN ) injection 4 mg (has no administration in time range)  0.9 %  sodium chloride  infusion (has no administration in time range)  ALPRAZolam  (XANAX ) tablet 0.25 mg (has no administration in time range)  magnesium  hydroxide (MILK OF MAGNESIA) suspension 30 mL (has no administration in time range)  traZODone  (DESYREL ) tablet 25 mg (has no administration in time range)  morphine   (PF) 4 MG/ML injection 4 mg (4 mg Intravenous Given 06/08/24 0025)  iohexol  (OMNIPAQUE ) 350 MG/ML injection 100 mL (100 mLs Intravenous Contrast Given 06/08/24 0029)  fentaNYL  (SUBLIMAZE ) injection 50 mcg (50 mcg Intravenous Given 06/08/24 0127)     IMPRESSION / MDM / ASSESSMENT AND PLAN / ED COURSE  I reviewed the triage vital signs and the nursing notes.  Differential diagnosis includes, but is not limited to, ACS, aortic dissection, pneumothorax, unstable angina  Patient's presentation is most consistent with acute presentation with potential threat to life or bodily function.  58 year old male presenting to the emergency department for evaluation of chest pain.  Ongoing active chest pain here.  Already took aspirin  at home.  Questionable allergy to nitroglycerin , low severity.  Given sublingual nitroglycerin  with some improvement in pain but had recurrent severe pain despite narcotic medication.  Initial EKG demonstrated new ST changes, but no evidence of STEMI.  Repeat EKGs remained without significant change.  Initial troponin 19, did uptrend to 47 on repeat.  Remainder of labs with stable renal impairment, minimally elevated lipase, not consistent with pancreatitis.  Discussing risk and benefits, patient was started on a nitro drip which she did tolerate without evidence of allergic reaction.  On reevaluation he does have completely resolved chest pain.  He did have a cath in January of this year demonstrating moderate CAD.  Does have mild troponin elevation, but suspect presentation may be more reflective of unstable angina.  As he did require a nitro drip to control his pain, do think he is appropriate for admission.  Will reach out to hospitalist team. Clinical Course as of 06/08/24 0353  [facility] Jun 08, 2024  [id number] Case reviewed with Dr. Lawence.  He will evaluate for anticipated admission. [NR]    Clinical Course User Index [NR] Levander Slate, MD     FINAL CLINICAL IMPRESSION(S) / ED DIAGNOSES    Final diagnoses:  Acute chest pain     Rx / DC Orders   ED Discharge Orders     None        Note:  This document was prepared using Dragon voice recognition software and may include unintentional dictation errors.   Levander Slate, MD 06/08/24 [phone]

## 2024-06-08 NOTE — Assessment & Plan Note (Addendum)
-   He has been having a troponin delta concerning for developing non-STEMI. - She will be admitted to a progressive unit observation bed. - Will follow serial troponins. - He will be placed on aspirin  as well as  iv morphine  sulfate for pain. - High-dose statin therapy would be provide as well as beta-blocker therapy. - Will place on IV heparin . - Will continue Ranexa  - Cardiology consult to be obtained. - [facility] group was notified about the patient.

## 2024-06-08 NOTE — ED Notes (Signed)
 Pt states that the pain in the center of his chest is 10/10

## 2024-06-08 NOTE — Progress Notes (Signed)
 Patient complains of nausea-zofran  given. He also says he is having stomach pain and he takes a medication for diverticulosis/diverticulitis. Messaged provider for new orders.

## 2024-06-08 NOTE — ED Notes (Addendum)
 CRITICAL VALUE STICKER  CRITICAL VALUE: Troponin 47  RECEIVER (on-site recipient of call): Psychiatrist  DATE & TIME NOTIFIED: 2:26 AM  MD NOTIFIED:  Ray   TIME OF NOTIFICATION: 2:26 AM

## 2024-06-08 NOTE — Assessment & Plan Note (Deleted)
-   She has been having a troponin delta concerning for developing non-STEMI. - She will be admitted to a progressive unit observation bed. - Will follow serial troponins. - He will be placed on aspirin  as well as as needed sublingual and iv morphine  sulfate for pain. - High-dose statin therapy would be provide as well as beta-blocker therapy. - Will place on IV heparin . - Will continue Ranexa  - Cardiology consult to be obtained. - [facility] was notified about the patient.

## 2024-06-08 NOTE — Progress Notes (Signed)
  Progress Note   Patient: Cory Rivas FMW:[id number] DOB: 11/06/1965 DOA: 06/08/2024     0 DOS: the patient was seen and examined on 06/08/2024   Brief hospital course: Cory Rivas is a 58 y.o. Obese Caucasian male with medical history significant for stage III chronic kidney disease, COPD, type 2 diabetes mellitus with peripheral neuropathy, hypertension, dyslipidemia, OSA, gout and coronary artery disease, who presented to the emergency room with acute onset of central chest pain.  Peak troponin 47. Recent heart cath 10/30/2023 showed moderate LAD and OM1 disease, not significantly Different from 05/2023.  Patient was loaded with aspirin , started on heparin  infusion.  Followed by cardiology.   Principal Problem:   Chest pain Active Problems:   Hypertensive urgency   Type 2 diabetes mellitus with peripheral neuropathy (HCC)   Dyslipidemia   Gout   Assessment and Plan: *Non-STEMI. Coronary artery disease with LAD lesion. Patient had a prolonged chest pain typical of angina, but lasted a few hours.  Troponin elevated, condition consistent with non-STEMI without ST elevation on EKG. Patient has been seen by cardiology, additional workup is considered. Continue heparin  infusion and nitroglycerin  infusion.   Hypertensive urgency Pressure has improved.  Dyslipidemia - Will continue statin therapy as well as Zetia .  Type 2 diabetes mellitus with peripheral neuropathy (HCC) Continue insulin  treatment.  Chronic kidney disease stage IIIa. Renal function still stable.  Gout - Will continue allopurinol .  Class II obesity with BMI 34.46. Diet and excise.     Subjective:  Chest pain feels better today  Physical Exam: Vitals:   06/08/24 0500 06/08/24 0600 06/08/24 0700 06/08/24 0805  BP: (!) 149/88 128/87 (!) 145/90   Pulse: 98 73 76   Resp:   18   Temp:    98.2 F (36.8 C)  TempSrc:    Oral  SpO2: 94% 94% 94%   Weight:      Height:       General exam: Appears  calm and comfortable  Respiratory system: Clear to auscultation. Respiratory effort normal. Cardiovascular system: S1 & S2 heard, RRR. No JVD, murmurs, rubs, gallops or clicks. No pedal edema. Gastrointestinal system: Abdomen is nondistended, soft and nontender. No organomegaly or masses felt. Normal bowel sounds heard. Central nervous system: Alert and oriented. No focal neurological deficits. Extremities: Symmetric 5 x 5 power. Skin: No rashes, lesions or ulcers Psychiatry: Judgement and insight appear normal. Mood & affect appropriate. '  Data Reviewed:  Lab results reviewed.  CT angiogram reviewed.  Family Communication: None  Disposition: Status is: Observation      Time spent: no charge minutes  Author: Murvin Mana, MD 06/08/2024 10:01 AM  For on call review [email or web address].

## 2024-06-08 NOTE — ED Notes (Signed)
 Pt appears more comfortable, radiating pain to left arm and back have resolved.  Pt continues to have squeezing, pressure to chest but it is decreased from 10/10 to 4/10.

## 2024-06-08 NOTE — Assessment & Plan Note (Signed)
-  We will place the patient on supplemental coverage with NovoLog . - Will continue basal coverage as well as Jardiance . - Will continue Lyrica .

## 2024-06-08 NOTE — Progress Notes (Signed)
 11 beat run of vtach noted on tele while pt was sleeping, patient asymptomatic, provider notified.

## 2024-06-09 ENCOUNTER — Other Ambulatory Visit: Payer: Self-pay

## 2024-06-09 ENCOUNTER — Other Ambulatory Visit (HOSPITAL_COMMUNITY): Payer: Self-pay

## 2024-06-09 ENCOUNTER — Encounter: Payer: Self-pay | Admitting: Cardiovascular Disease

## 2024-06-09 DIAGNOSIS — F17210 Nicotine dependence, cigarettes, uncomplicated: Secondary | ICD-10-CM | POA: Diagnosis not present

## 2024-06-09 DIAGNOSIS — I10 Essential (primary) hypertension: Secondary | ICD-10-CM

## 2024-06-09 DIAGNOSIS — M109 Gout, unspecified: Secondary | ICD-10-CM | POA: Diagnosis not present

## 2024-06-09 DIAGNOSIS — I2510 Atherosclerotic heart disease of native coronary artery without angina pectoris: Secondary | ICD-10-CM

## 2024-06-09 DIAGNOSIS — Z7982 Long term (current) use of aspirin: Secondary | ICD-10-CM | POA: Diagnosis not present

## 2024-06-09 DIAGNOSIS — I5032 Chronic diastolic (congestive) heart failure: Secondary | ICD-10-CM | POA: Diagnosis not present

## 2024-06-09 DIAGNOSIS — Z79899 Other long term (current) drug therapy: Secondary | ICD-10-CM | POA: Diagnosis not present

## 2024-06-09 DIAGNOSIS — I214 Non-ST elevation (NSTEMI) myocardial infarction: Secondary | ICD-10-CM

## 2024-06-09 DIAGNOSIS — E1142 Type 2 diabetes mellitus with diabetic polyneuropathy: Secondary | ICD-10-CM | POA: Diagnosis not present

## 2024-06-09 DIAGNOSIS — I130 Hypertensive heart and chronic kidney disease with heart failure and stage 1 through stage 4 chronic kidney disease, or unspecified chronic kidney disease: Secondary | ICD-10-CM | POA: Diagnosis not present

## 2024-06-09 DIAGNOSIS — N1831 Chronic kidney disease, stage 3a: Secondary | ICD-10-CM

## 2024-06-09 DIAGNOSIS — E785 Hyperlipidemia, unspecified: Secondary | ICD-10-CM | POA: Diagnosis not present

## 2024-06-09 DIAGNOSIS — Z794 Long term (current) use of insulin: Secondary | ICD-10-CM | POA: Diagnosis not present

## 2024-06-09 DIAGNOSIS — I160 Hypertensive urgency: Secondary | ICD-10-CM | POA: Diagnosis not present

## 2024-06-09 DIAGNOSIS — J449 Chronic obstructive pulmonary disease, unspecified: Secondary | ICD-10-CM | POA: Diagnosis not present

## 2024-06-09 DIAGNOSIS — R079 Chest pain, unspecified: Secondary | ICD-10-CM | POA: Diagnosis present

## 2024-06-09 DIAGNOSIS — K5730 Diverticulosis of large intestine without perforation or abscess without bleeding: Secondary | ICD-10-CM | POA: Diagnosis not present

## 2024-06-09 DIAGNOSIS — I5033 Acute on chronic diastolic (congestive) heart failure: Secondary | ICD-10-CM | POA: Diagnosis not present

## 2024-06-09 LAB — BASIC METABOLIC PANEL WITH GFR
Anion gap: 9 (ref 5–15)
BUN: 11 mg/dL (ref 6–20)
CO2: 24 mmol/L (ref 22–32)
Calcium: 8.6 mg/dL — ABNORMAL LOW (ref 8.9–10.3)
Chloride: 104 mmol/L (ref 98–111)
Creatinine, Ser: 1.37 mg/dL — ABNORMAL HIGH (ref 0.61–1.24)
GFR, Estimated: >60 mL/min (ref >60)
Glucose, Bld: 137 mg/dL — ABNORMAL HIGH (ref 70–99)
Potassium: 4 mmol/L (ref 3.5–5.1)
Sodium: 137 mmol/L (ref 135–145)

## 2024-06-09 LAB — CBC
HCT: 44 % (ref 39.0–52.0)
Hemoglobin: 14.9 g/dL (ref 13.0–17.0)
MCH: 29 pg (ref 26.0–34.0)
MCHC: 33.9 g/dL (ref 30.0–36.0)
MCV: 85.6 fL (ref 80.0–100.0)
Platelets: 189 K/uL (ref 150–400)
RBC: 5.14 MIL/uL (ref 4.22–5.81)
RDW: 13.3 % (ref 11.5–15.5)
WBC: 10.5 K/uL (ref 4.0–10.5)
nRBC: 0 % (ref 0.0–0.2)

## 2024-06-09 LAB — LIPOPROTEIN A (LPA): Lipoprotein (a): <8.4 nmol/L (ref <75.0)

## 2024-06-09 LAB — GLUCOSE, CAPILLARY
Glucose-Capillary: 146 mg/dL — ABNORMAL HIGH (ref 70–99)
Glucose-Capillary: 134 mg/dL — ABNORMAL HIGH (ref 70–99)

## 2024-06-09 MED ORDER — NICOTINE 21 MG/24HR TD PT24
21.0000 mg | MEDICATED_PATCH | Freq: Every day | TRANSDERMAL | 0 refills | Status: DC
  Filled 2024-06-09: qty 28, 28d supply, fill #0

## 2024-06-09 MED ORDER — PRASUGREL HCL 10 MG PO TABS
10.0000 mg | ORAL_TABLET | Freq: Every day | ORAL | 0 refills | Status: DC
  Filled 2024-06-09: qty 30, 30d supply, fill #0

## 2024-06-09 MED ORDER — REPATHA SURECLICK 140 MG/ML ~~LOC~~ SOAJ
140.0000 mg | SUBCUTANEOUS | 2 refills | Status: AC
  Filled 2024-06-09: qty 2, 28d supply, fill #0
  Filled 2024-07-14: qty 2, 28d supply, fill #1

## 2024-06-09 NOTE — Plan of Care (Signed)

## 2024-06-09 NOTE — TOC Benefit Eligibility Note (Signed)
 Pharmacy Patient Advocate Encounter  Insurance verification completed.    The patient is insured through [facility].     Ran test claim for Prasugrel  10mg  (Effient ) and the current 30 day co-pay is $4.  Ran test claim for Repatha  140mg  and the current 30 day co-pay is $4.   This test claim was processed through [employer]- copay amounts may vary at other pharmacies due to [employer], or as the patient moves through the different stages of their insurance plan.

## 2024-06-09 NOTE — Plan of Care (Signed)
 Problem: Education: Goal: Understanding of cardiac disease, CV risk reduction, and recovery process will improve 06/09/2024 1446 by Arloa Dene KIDD, RN Outcome: Adequate for Discharge 06/09/2024 1110 by Arloa Dene KIDD, RN Outcome: Progressing Goal: Individualized Educational Video(s) 06/09/2024 1446 by Arloa Dene KIDD, RN Outcome: Adequate for Discharge 06/09/2024 1110 by Arloa Dene KIDD, RN Outcome: Progressing   Problem: Activity: Goal: Ability to tolerate increased activity will improve 06/09/2024 1446 by Arloa Dene KIDD, RN Outcome: Adequate for Discharge 06/09/2024 1110 by Arloa Dene KIDD, RN Outcome: Progressing   Problem: Cardiac: Goal: Ability to achieve and maintain adequate cardiovascular perfusion will improve 06/09/2024 1446 by Arloa Dene KIDD, RN Outcome: Adequate for Discharge 06/09/2024 1110 by Arloa Dene KIDD, RN Outcome: Progressing   Problem: Health Behavior/Discharge Planning: Goal: Ability to safely manage health-related needs after discharge will improve 06/09/2024 1446 by Arloa Dene KIDD, RN Outcome: Adequate for Discharge 06/09/2024 1110 by Arloa Dene KIDD, RN Outcome: Progressing   Problem: Education: Goal: Ability to describe self-care measures that may prevent or decrease complications (Diabetes Survival Skills Education) will improve 06/09/2024 1446 by Arloa Dene KIDD, RN Outcome: Adequate for Discharge 06/09/2024 1110 by Arloa Dene KIDD, RN Outcome: Progressing Goal: Individualized Educational Video(s) 06/09/2024 1446 by Arloa Dene KIDD, RN Outcome: Adequate for Discharge 06/09/2024 1110 by Arloa Dene KIDD, RN Outcome: Progressing   Problem: Coping: Goal: Ability to adjust to condition or change in health will improve 06/09/2024 1446 by Arloa Dene KIDD, RN Outcome: Adequate for Discharge 06/09/2024 1110 by Arloa Dene KIDD, RN Outcome: Progressing   Problem: Fluid Volume: Goal: Ability to maintain a  balanced intake and output will improve 06/09/2024 1446 by Arloa Dene KIDD, RN Outcome: Adequate for Discharge 06/09/2024 1110 by Arloa Dene KIDD, RN Outcome: Progressing   Problem: Health Behavior/Discharge Planning: Goal: Ability to identify and utilize available resources and services will improve 06/09/2024 1446 by Arloa Dene KIDD, RN Outcome: Adequate for Discharge 06/09/2024 1110 by Arloa Dene KIDD, RN Outcome: Progressing Goal: Ability to manage health-related needs will improve 06/09/2024 1446 by Arloa Dene KIDD, RN Outcome: Adequate for Discharge 06/09/2024 1110 by Arloa Dene KIDD, RN Outcome: Progressing   Problem: Metabolic: Goal: Ability to maintain appropriate glucose levels will improve 06/09/2024 1446 by Arloa Dene KIDD, RN Outcome: Adequate for Discharge 06/09/2024 1110 by Arloa Dene KIDD, RN Outcome: Progressing   Problem: Nutritional: Goal: Maintenance of adequate nutrition will improve 06/09/2024 1446 by Arloa Dene KIDD, RN Outcome: Adequate for Discharge 06/09/2024 1110 by Arloa Dene KIDD, RN Outcome: Progressing Goal: Progress toward achieving an optimal weight will improve 06/09/2024 1446 by Arloa Dene KIDD, RN Outcome: Adequate for Discharge 06/09/2024 1110 by Arloa Dene KIDD, RN Outcome: Progressing   Problem: Skin Integrity: Goal: Risk for impaired skin integrity will decrease 06/09/2024 1446 by Arloa Dene KIDD, RN Outcome: Adequate for Discharge 06/09/2024 1110 by Arloa Dene KIDD, RN Outcome: Progressing   Problem: Tissue Perfusion: Goal: Adequacy of tissue perfusion will improve 06/09/2024 1446 by Arloa Dene KIDD, RN Outcome: Adequate for Discharge 06/09/2024 1110 by Arloa Dene KIDD, RN Outcome: Progressing   Problem: Education: Goal: Understanding of CV disease, CV risk reduction, and recovery process will improve 06/09/2024 1446 by Arloa Dene KIDD, RN Outcome: Adequate for Discharge 06/09/2024 1110 by  Arloa Dene KIDD, RN Outcome: Progressing Goal: Individualized Educational Video(s) 06/09/2024 1446 by Arloa Dene KIDD, RN Outcome: Adequate for Discharge 06/09/2024 1110 by Arloa Dene KIDD, RN Outcome: Progressing   Problem: Activity: Goal: Ability to return to baseline activity level  will improve 06/09/2024 1446 by Arloa Dene KIDD, RN Outcome: Adequate for Discharge 06/09/2024 1110 by Arloa Dene KIDD, RN Outcome: Progressing   Problem: Cardiovascular: Goal: Ability to achieve and maintain adequate cardiovascular perfusion will improve 06/09/2024 1446 by Arloa Dene KIDD, RN Outcome: Adequate for Discharge 06/09/2024 1110 by Arloa Dene KIDD, RN Outcome: Progressing Goal: Vascular access site(s) Level 0-1 will be maintained 06/09/2024 1446 by Arloa Dene KIDD, RN Outcome: Adequate for Discharge 06/09/2024 1110 by Arloa Dene KIDD, RN Outcome: Progressing   Problem: Health Behavior/Discharge Planning: Goal: Ability to safely manage health-related needs after discharge will improve 06/09/2024 1446 by Arloa Dene KIDD, RN Outcome: Adequate for Discharge 06/09/2024 1110 by Arloa Dene KIDD, RN Outcome: Progressing   Problem: Education: Goal: Knowledge of General Education information will improve Description: Including pain rating scale, medication(s)/side effects and non-pharmacologic comfort measures 06/09/2024 1446 by Arloa Dene KIDD, RN Outcome: Adequate for Discharge 06/09/2024 1110 by Arloa Dene KIDD, RN Outcome: Progressing   Problem: Health Behavior/Discharge Planning: Goal: Ability to manage health-related needs will improve 06/09/2024 1446 by Arloa Dene KIDD, RN Outcome: Adequate for Discharge 06/09/2024 1110 by Arloa Dene KIDD, RN Outcome: Progressing   Problem: Clinical Measurements: Goal: Ability to maintain clinical measurements within normal limits will improve 06/09/2024 1446 by Arloa Dene KIDD, RN Outcome: Adequate for  Discharge 06/09/2024 1110 by Arloa Dene KIDD, RN Outcome: Progressing Goal: Will remain free from infection 06/09/2024 1446 by Arloa Dene KIDD, RN Outcome: Adequate for Discharge 06/09/2024 1110 by Arloa Dene KIDD, RN Outcome: Progressing Goal: Diagnostic test results will improve 06/09/2024 1446 by Arloa Dene KIDD, RN Outcome: Adequate for Discharge 06/09/2024 1110 by Arloa Dene KIDD, RN Outcome: Progressing Goal: Respiratory complications will improve 06/09/2024 1446 by Arloa Dene KIDD, RN Outcome: Adequate for Discharge 06/09/2024 1110 by Arloa Dene KIDD, RN Outcome: Progressing Goal: Cardiovascular complication will be avoided 06/09/2024 1446 by Arloa Dene KIDD, RN Outcome: Adequate for Discharge 06/09/2024 1110 by Arloa Dene KIDD, RN Outcome: Progressing   Problem: Activity: Goal: Risk for activity intolerance will decrease 06/09/2024 1446 by Arloa Dene KIDD, RN Outcome: Adequate for Discharge 06/09/2024 1110 by Arloa Dene KIDD, RN Outcome: Progressing   Problem: Nutrition: Goal: Adequate nutrition will be maintained 06/09/2024 1446 by Arloa Dene KIDD, RN Outcome: Adequate for Discharge 06/09/2024 1110 by Arloa Dene KIDD, RN Outcome: Progressing   Problem: Coping: Goal: Level of anxiety will decrease 06/09/2024 1446 by Arloa Dene KIDD, RN Outcome: Adequate for Discharge 06/09/2024 1110 by Arloa Dene KIDD, RN Outcome: Progressing   Problem: Elimination: Goal: Will not experience complications related to bowel motility 06/09/2024 1446 by Arloa Dene KIDD, RN Outcome: Adequate for Discharge 06/09/2024 1110 by Arloa Dene KIDD, RN Outcome: Progressing Goal: Will not experience complications related to urinary retention 06/09/2024 1446 by Arloa Dene KIDD, RN Outcome: Adequate for Discharge 06/09/2024 1110 by Arloa Dene KIDD, RN Outcome: Progressing   Problem: Pain Managment: Goal: General experience of comfort will improve  and/or be controlled 06/09/2024 1446 by Arloa Dene KIDD, RN Outcome: Adequate for Discharge 06/09/2024 1110 by Arloa Dene KIDD, RN Outcome: Progressing   Problem: Safety: Goal: Ability to remain free from injury will improve 06/09/2024 1446 by Arloa Dene KIDD, RN Outcome: Adequate for Discharge 06/09/2024 1110 by Arloa Dene KIDD, RN Outcome: Progressing   Problem: Skin Integrity: Goal: Risk for impaired skin integrity will decrease 06/09/2024 1446 by Arloa Dene KIDD, RN Outcome: Adequate for Discharge 06/09/2024 1110 by Arloa Dene KIDD, RN Outcome: Progressing

## 2024-06-09 NOTE — Discharge Summary (Signed)
 Physician Discharge Summary   Patient: Cory Rivas MRN: [id number] DOB: 06-26-1966  Admit date:     06/08/2024  Discharge date: 06/09/24  Discharge Physician: Murvin Mana   PCP: Leavy Mole, PA-C   Recommendations at discharge:   Follow-up with PCP in 1 week. Follow-up with cardiology in 1 to 2 weeks  Discharge Diagnoses: Principal Problem:   Chest pain Active Problems:   Hypertensive urgency   Type 2 diabetes mellitus with peripheral neuropathy (HCC)   Dyslipidemia   Gout   Non-ST elevation (NSTEMI) myocardial infarction [facility])  Resolved Problems:   * No resolved hospital problems. *  Hospital Course: Cory Rivas is a 58 y.o. Obese Caucasian male with medical history significant for stage III chronic kidney disease, COPD, type 2 diabetes mellitus with peripheral neuropathy, hypertension, dyslipidemia, OSA, gout and coronary artery disease, who presented to the emergency room with acute onset of central chest pain.  Peak troponin 1696 Recent heart cath 10/30/2023 showed moderate LAD and OM1 disease, not significantly Different from 05/2023.  Patient was loaded with aspirin , started on heparin  infusion.  Cardiac cath showed 95% stenosis in LAD.  A drug-eluting stent was placed on 8/25.  Patient doing well afterwards, medically stable for discharge.    Assessment and Plan: *Non-STEMI. Coronary artery disease with LAD lesion. Patient had a prolonged chest pain typical of angina, but lasted a few hours.  Troponin elevated, condition consistent with non-STEMI without ST elevation on EKG. Patient had LAD stent placed by cardiology 8/25, doing well postop.  Will continue dual antiplatelet treatment for at least a year.  Follow-up with cardiology as outpatient.     Hypertensive urgency Pressure has improved.   Dyslipidemia - Will continue statin therapy as well as Zetia .   Type 2 diabetes mellitus with peripheral neuropathy (HCC) , Home treatment.   Chronic kidney  disease stage IIIa. Renal function still stable.   Gout - Will continue allopurinol .   Class II obesity with BMI 34.46. Diet and excise.         Consultants: Cardiology Procedures performed: Heart cath Disposition: Home Diet recommendation:  Discharge Diet Orders (From admission, onward)     Start     Ordered   06/09/24 0000  Diet - low sodium heart healthy        06/09/24 1403           Cardiac diet DISCHARGE MEDICATION: Allergies as of 06/09/2024       Reactions   Oxycodone  Nausea And Vomiting   Entresto  [sacubitril -valsartan ] Nausea Only, Other (See Comments)   Lightheaded and dizzy    Onion Nausea And Vomiting   Nitroglycerin  Itching, Rash   IV product, patient says SL tablet is fine        Medication List     TAKE these medications    allopurinol  300 MG tablet Commonly known as: ZYLOPRIM  Take 1 tablet (300 mg total) by mouth daily as needed (Gout flare). Home med.   allopurinol  100 MG tablet Commonly known as: ZYLOPRIM  Take 1 tablet (100 mg total) by mouth daily with the 300mg  tablet for a total daily dose of 400mg .   amLODipine  10 MG tablet Commonly known as: NORVASC  Take 1 tablet (10 mg total) by mouth daily.   Aspirin  Low Dose 81 MG tablet Generic drug: aspirin  EC Take 1 tablet (81 mg total) by mouth daily. Swallow whole.   baclofen  10 MG tablet Commonly known as: LIORESAL  Take 1 tablet (10 mg total) by mouth 3 (three)  times daily as needed for muscle spasms.   bisoprolol  5 MG tablet Commonly known as: ZEBETA  Take 2 tablets (10 mg total) by mouth daily.   cetirizine  10 MG tablet Commonly known as: ZYRTEC  Take 1 tablet (10 mg total) by mouth daily.   cromolyn  4 % ophthalmic solution Commonly known as: OPTICROM  Place 1 drop into both eyes 4 (four) times daily as needed.   DULoxetine  30 MG capsule Commonly known as: CYMBALTA  Take 2 capsules (60 mg total) by mouth every morning AND 1 capsule (30 mg total) every evening.    empagliflozin  10 MG Tabs tablet Commonly known as: Jardiance  Take 1 tablet (10 mg total) by mouth daily.   ezetimibe  10 MG tablet Commonly known as: ZETIA  Take 1 tablet (10 mg total) by mouth daily.   fluticasone  50 MCG/ACT nasal spray Commonly known as: FLONASE  Place 1 spray into both nostrils daily.   FreeStyle Libre 3 Sensor Misc Place 1 sensor onto skin every 14 (fourteen) days to monitor blood sugar continuously   insulin  lispro 100 UNIT/ML KwikPen Commonly known as: HUMALOG  Inject 10 Units into the skin 3 (three) times daily. Plus sliding scale up per instructions   ipratropium-albuterol  0.5-2.5 (3) MG/3ML Soln Commonly known as: DUONEB Take 3 mLs by nebulization every 6 (six) hours as needed.   Lantus  SoloStar 100 UNIT/ML Solostar Pen Generic drug: insulin  glargine Inject 20-25 Units into the skin at bedtime.   levocetirizine 5 MG tablet Commonly known as: XYZAL  Take 1 tablet (5 mg total) by mouth every evening.   nicotine  21 mg/24hr patch Commonly known as: NICODERM CQ  - dosed in mg/24 hours Place 1 patch (21 mg total) onto the skin daily.   nicotine  polacrilex 4 MG lozenge Commonly known as: Nicotine  Mini Take 1 lozenge (4 mg total) by mouth as needed.   pantoprazole  40 MG tablet Commonly known as: Protonix  Take 1 tablet (40 mg total) by mouth daily.   prasugrel  10 MG Tabs tablet Commonly known as: EFFIENT  Take 1 tablet (10 mg total) by mouth daily. Start taking on: June 10, 2024   pregabalin  50 MG capsule Commonly known as: LYRICA  Take 1 capsule (50 mg total) by mouth 2 (two) times daily as needed. Home med.   ranolazine  500 MG 12 hr tablet Commonly known as: RANEXA  Take 1 tablet (500 mg total) by mouth 2 (two) times daily.   Repatha  SureClick 140 MG/ML Soaj Generic drug: Evolocumab  Inject 140 mg into the skin every 14 (fourteen) days.   rosuvastatin  40 MG tablet Commonly known as: CRESTOR  Take 1 tablet (40 mg total) by mouth daily.    Spiriva  Respimat 2.5 MCG/ACT Aers Generic drug: Tiotropium Bromide  Monohydrate Inhale 2 puffs into the lungs daily.   spironolactone  25 MG tablet Commonly known as: ALDACTONE  Take 1 tablet (25 mg total) by mouth daily.   sucralfate  1 g tablet Commonly known as: Carafate  Take 1 tablet (1 g total) by mouth 4 (four) times daily -  with meals and at bedtime.   Symbicort  160-4.5 MCG/ACT inhaler Generic drug: budesonide -formoterol  Inhale 2 puffs into the lungs 2 (two) times daily.   torsemide  20 MG tablet Commonly known as: DEMADEX  Take 2 tablets (40 mg total) by mouth daily.   Trulicity  3 MG/0.5ML Soaj Generic drug: Dulaglutide  Inject 3 mg into the skin once a week.   valsartan  80 MG tablet Commonly known as: Diovan  Take 1 tablet (80 mg total) by mouth daily.   Ventolin  HFA 108 (90 Base) MCG/ACT inhaler Generic drug:  albuterol  Inhale 2 puffs into the lungs every 6 (six) hours as needed for wheezing or shortness of breath.        Follow-up Information     Leavy Mole, PA-C Follow up in 1 week(s).   Specialty: Family Medicine Contact information: [address] [place] [address] [phone]         Darron Deatrice LABOR, MD Follow up in 1 month(s).   Specialty: Cardiology Contact information: [address] [place] [address] [phone]                Discharge Exam: Fredricka Weights   06/07/24 2358  Weight: 99.8 kg   General exam: Appears calm and comfortable  Respiratory system: Clear to auscultation. Respiratory effort normal. Cardiovascular system: S1 & S2 heard, RRR. No JVD, murmurs, rubs, gallops or clicks. No pedal edema. Gastrointestinal system: Abdomen is nondistended, soft and nontender. No organomegaly or masses felt. Normal bowel sounds heard. Central nervous system: Alert and oriented. No focal neurological deficits. Extremities: Symmetric 5 x 5 power. Skin: No rashes, lesions or ulcers Psychiatry:  Judgement and insight appear normal. Mood & affect appropriate.    Condition at discharge: good  The results of significant diagnostics from this hospitalization (including imaging, microbiology, ancillary and laboratory) are listed below for reference.   Imaging Studies: CARDIAC CATHETERIZATION Result Date: 06/08/2024   Ost LAD lesion is 55% stenosed.   1st Mrg lesion is 60% stenosed.   Prox LAD to Mid LAD lesion is 95% stenosed.   1st Diag lesion is 60% stenosed.   A drug-eluting stent was successfully placed using a STENT ONYX FRONTIER 3.0X15.   Balloon angioplasty was performed using a BALLOON TREK RX 2.5X12.   Post intervention, there is a 0% residual stenosis.   Post intervention, there is a 10% residual stenosis.   In the absence of any other complications or medical issues, we expect the patient to be ready for discharge from an interventional cardiology perspective on 06/09/2024.   Recommend uninterrupted dual antiplatelet therapy with Aspirin  81mg  daily and Prasugrel  10mg  daily for a minimum of 12 months (ACS-Class I recommendation). 1.  Severe one-vessel coronary artery disease with evidence of hazy stenosis in the proximal LAD at the bifurcation of a large diagonal branch likely due to plaque rupture.  Stable moderate ostial LAD and OM1 stenosis. 2.  Left ventricular angiography was not performed.  EF was normal by echo. 3.  Moderately severely elevated left ventricular end-diastolic pressure at 28 to 30 mmHg. 4.  Difficult but successful OCT guided angioplasty and drug-eluting stent placement to the LAD with balloon angioplasty to the ostial diagonal .  Difficult procedure overall due to bifurcation involvement and significant difficulty in wiring the LAD due to steep angulation after the diagonal origin. Recommendations: See recommendations below.  Continue aggressive treatment of risk factors. I stopped IV fluids and can resume oral diuretic likely tomorrow.   ECHOCARDIOGRAM COMPLETE Result  Date: 06/08/2024    ECHOCARDIOGRAM REPORT   Patient Name:   NEEKO PHARO Date of Exam: 06/08/2024 Medical Rec #:  [id number]       Height:       67.0 in Accession #:    [id number]      Weight:       220.0 lb Date of Birth:  12-06-1965        BSA:          2.106 m Patient Age:    33 years  BP:           128/87 mmHg Patient Gender: M               HR:           73 bpm. Exam Location:  [facility] Procedure: 2D Echo, Cardiac Doppler and Color Doppler (Both Spectral and Color            Flow Doppler were utilized during procedure). Indications:     NSTEMI I21.4  History:         Patient has prior history of Echocardiogram examinations, most                  recent 11/12/2023. CAD, COPD; Risk Factors:Diabetes.  Sonographer:     Christopher Furnace Referring Phys:  [id number] JAN A MANSY Diagnosing Phys: Deatrice Cage MD  Sonographer Comments: Technically challenging study due to limited acoustic windows. IMPRESSIONS  1. Left ventricular ejection fraction, by estimation, is 55 to 60%. The left ventricle has normal function. The left ventricle has no regional wall motion abnormalities. There is moderate left ventricular hypertrophy. Left ventricular diastolic parameters are consistent with Grade II diastolic dysfunction (pseudonormalization).  2. Right ventricular systolic function is normal. The right ventricular size is normal. Tricuspid regurgitation signal is inadequate for assessing PA pressure.  3. Left atrial size was mildly dilated.  4. The mitral valve is normal in structure. Mild mitral valve regurgitation. No evidence of mitral stenosis.  5. The aortic valve is normal in structure. Aortic valve regurgitation is not visualized. Aortic valve sclerosis/calcification is present, without any evidence of aortic stenosis. FINDINGS  Left Ventricle: Left ventricular ejection fraction, by estimation, is 55 to 60%. The left ventricle has normal function. The left ventricle has no regional wall motion abnormalities. The left ventricular  internal cavity size was normal in size. There is  moderate left ventricular hypertrophy. Left ventricular diastolic parameters are consistent with Grade II diastolic dysfunction (pseudonormalization). Right Ventricle: The right ventricular size is normal. No increase in right ventricular wall thickness. Right ventricular systolic function is normal. Tricuspid regurgitation signal is inadequate for assessing PA pressure. Left Atrium: Left atrial size was mildly dilated. Right Atrium: Right atrial size was normal in size. Pericardium: There is no evidence of pericardial effusion. Mitral Valve: The mitral valve is normal in structure. Mild mitral valve regurgitation. No evidence of mitral valve stenosis. MV peak gradient, 6.2 mmHg. The mean mitral valve gradient is 3.0 mmHg. Tricuspid Valve: The tricuspid valve is normal in structure. Tricuspid valve regurgitation is not demonstrated. No evidence of tricuspid stenosis. Aortic Valve: The aortic valve is normal in structure. Aortic valve regurgitation is not visualized. Aortic valve sclerosis/calcification is present, without any evidence of aortic stenosis. Aortic valve mean gradient measures 6.0 mmHg. Aortic valve peak  gradient measures 12.4 mmHg. Aortic valve area, by VTI measures 2.15 cm. Pulmonic Valve: The pulmonic valve was normal in structure. Pulmonic valve regurgitation is not visualized. No evidence of pulmonic stenosis. Aorta: The aortic root is normal in size and structure. IAS/Shunts: No atrial level shunt detected by color flow Doppler.  LEFT VENTRICLE PLAX 2D LVIDd:         3.93 cm   Diastology LVIDs:         2.54 cm   LV e' medial:    4.06 cm/s LV PW:         1.51 cm   LV E/e' medial:  23.4 LV IVS:        1.41  cm   LV e' lateral:   4.35 cm/s LVOT diam:     2.00 cm   LV E/e' lateral: 21.9 LV SV:         71 LV SV Index:   34 LVOT Area:     3.14 cm  RIGHT VENTRICLE RV Basal diam:  4.54 cm RV Mid diam:    3.94 cm RV S prime:     7.05 cm/s TAPSE (M-mode):  3.8 cm LEFT ATRIUM             Index        RIGHT ATRIUM           Index LA diam:        3.80 cm 1.80 cm/m   RA Area:     17.70 cm LA Vol (A2C):   67.7 ml 32.15 ml/m  RA Volume:   55.80 ml  26.50 ml/m LA Vol (A4C):   59.7 ml 28.35 ml/m LA Biplane Vol: 64.6 ml 30.68 ml/m  AORTIC VALVE AV Area (Vmax):    1.84 cm AV Area (Vmean):   1.89 cm AV Area (VTI):     2.15 cm AV Vmax:           176.00 cm/s AV Vmean:          115.500 cm/s AV VTI:            0.328 m AV Peak Grad:      12.4 mmHg AV Mean Grad:      6.0 mmHg LVOT Vmax:         103.00 cm/s LVOT Vmean:        69.500 cm/s LVOT VTI:          0.225 m LVOT/AV VTI ratio: 0.68  AORTA Ao Root diam: 2.70 cm MITRAL VALVE                TRICUSPID VALVE MV Area (PHT): 3.66 cm     TR Peak grad:   11.3 mmHg MV Area VTI:   2.19 cm     TR Vmax:        168.00 cm/s MV Peak grad:  6.2 mmHg MV Mean grad:  3.0 mmHg     SHUNTS MV Vmax:       1.25 m/s     Systemic VTI:  0.22 m MV Vmean:      87.2 cm/s    Systemic Diam: 2.00 cm MV Decel Time: 207 msec MV E velocity: 95.10 cm/s MV A velocity: 126.00 cm/s MV E/A ratio:  0.75 Deatrice Cage MD Electronically signed by Deatrice Cage MD Signature Date/Time: 06/08/2024/10:20:49 AM    Final    CT Angio Chest/Abd/Pel for Dissection W and/or Wo Contrast Result Date: 06/08/2024 CLINICAL DATA:  Acute onset chest pain EXAM: CT ANGIOGRAPHY CHEST, ABDOMEN AND PELVIS TECHNIQUE: Non-contrast CT of the chest was initially obtained. Multidetector CT imaging through the chest, abdomen and pelvis was performed using the standard protocol during bolus administration of intravenous contrast. Multiplanar reconstructed images and MIPs were obtained and reviewed to evaluate the vascular anatomy. RADIATION DOSE REDUCTION: This exam was performed according to the departmental dose-optimization program which includes automated exposure control, adjustment of the mA and/or kV according to patient size and/or use of iterative reconstruction technique.  CONTRAST:  OMNIPAQUE  IOHEXOL  350 MG/ML SOLN COMPARISON:  Chest x-ray from earlier in the same day. FINDINGS: CTA CHEST FINDINGS Cardiovascular: Initial precontrast images show mild atherosclerotic calcification of the aorta. No aneurysmal dilatation is  seen. No hyperdense crescent to suggest acute aortic injury is noted. Post-contrast images show no evidence of dissection. Heart is not significantly enlarged. The pulmonary artery shows a normal branching pattern bilaterally. No intraluminal filling defect is noted. Mediastinum/Nodes: Thoracic inlet is within normal limits. No hilar or mediastinal adenopathy is noted. The esophagus as visualized is within normal limits. Lungs/Pleura: Lungs are well aerated bilaterally. No focal infiltrate or effusion is seen. No parenchymal nodules are noted. Musculoskeletal: No acute bony abnormality is noted. Postsurgical changes at cervicothoracic junction are seen. Review of the MIP images confirms the above findings. CTA ABDOMEN AND PELVIS FINDINGS VASCULAR Aorta: Mild atherosclerotic calcifications without aneurysmal dilatation or dissection. Celiac: Patent without evidence of aneurysm, dissection, vasculitis or significant stenosis. SMA: Patent without evidence of aneurysm, dissection, vasculitis or significant stenosis. Renals: Single renal artery is noted on the right with dual renal arteries on the left. IMA: Patent without evidence of aneurysm, dissection, vasculitis or significant stenosis. Inflow: Iliac show no aneurysmal dilatation or dissection. Veins: No specific venous abnormality is noted Review of the MIP images confirms the above findings. NON-VASCULAR Hepatobiliary: No focal liver abnormality is seen. Status post cholecystectomy. No biliary dilatation. Pancreas: Unremarkable. No pancreatic ductal dilatation or surrounding inflammatory changes. Spleen: Normal in size without focal abnormality. Adrenals/Urinary Tract: Adrenal glands are within normal limits.  Kidneys demonstrate a normal enhancement pattern bilaterally. No obstructive changes are seen. The bladder is partially distended. Stomach/Bowel: Scattered diverticular change of the colon is noted without evidence of diverticulitis. The appendix is not well visualized. No inflammatory changes to suggest appendicitis are seen. Stomach and small bowel are within normal limits. Lymphatic: No lymphadenopathy is identified. Reproductive: Prostate is unremarkable. Other: No abdominal wall hernia or abnormality. No abdominopelvic ascites. Musculoskeletal: No acute or significant osseous findings. Review of the MIP images confirms the above findings. IMPRESSION: CTA of the chest: No evidence of aortic abnormality. No pulmonary embolism identified. CTA of the abdomen and pelvis: No arterial abnormality is noted. Diverticulosis without diverticulitis. Electronically Signed   By: Oneil Devonshire M.D.   On: 06/08/2024 01:09   DG Chest Port 1 View Result Date: 06/08/2024 CLINICAL DATA:  355200 Chest pain 355200 Patient reports sudden onset of chest pain described as pressure and sharp. The pain is centralized and radiates to back and left arm. Associated with nausea, shortness of breath, and diaphoresis. EXAM: PORTABLE CHEST 1 VIEW COMPARISON:  Cxr 05/17/24 FINDINGS: The heart and mediastinal contours are within normal limits. No focal consolidation. No pulmonary edema. No pleural effusion. No pneumothorax. No acute osseous abnormality. Cervicothoracic spine surgical hardware. IMPRESSION: No active disease. Electronically Signed   By: Morgane  Naveau M.D.   On: 06/08/2024 00:20   CT ABDOMEN PELVIS W CONTRAST Result Date: 06/04/2024 CLINICAL DATA:  Pancreatitis, acute, severe. Epigastric pain, nausea, vomiting EXAM: CT ABDOMEN AND PELVIS WITH CONTRAST TECHNIQUE: Multidetector CT imaging of the abdomen and pelvis was performed using the standard protocol following bolus administration of intravenous contrast. RADIATION DOSE  REDUCTION: This exam was performed according to the departmental dose-optimization program which includes automated exposure control, adjustment of the mA and/or kV according to patient size and/or use of iterative reconstruction technique. CONTRAST:  OMNIPAQUE  IOHEXOL  300 MG/ML  SOLN COMPARISON:  12/29/2023 FINDINGS: Lower chest: No acute abnormality Hepatobiliary: Prior cholecystectomy. Diffuse fatty infiltration of the liver. No focal hepatic abnormality. Pancreas: No focal abnormality or ductal dilatation. No surrounding inflammation. Spleen: No focal abnormality.  Normal size. Adrenals/Urinary Tract: No adrenal abnormality. No focal  renal abnormality. No stones or hydronephrosis. Urinary bladder is unremarkable. Stomach/Bowel: Sigmoid diverticulosis. No active diverticulitis. Stomach and small bowel decompressed, unremarkable. No obstruction or inflammatory process. Vascular/Lymphatic: No evidence of aneurysm or adenopathy. Scattered aortic atherosclerosis. Reproductive: No visible focal abnormality. Other: No free fluid or free air. Musculoskeletal: No acute bony abnormality. IMPRESSION: No evidence of acute pancreatitis. Hepatic steatosis. Scattered aortic atherosclerosis. Sigmoid diverticulosis. Electronically Signed   By: Franky Crease M.D.   On: 06/04/2024 19:28   DG Chest 2 View Result Date: 05/17/2024 CLINICAL DATA:  Chest pain. EXAM: DG CHEST 2V COMPARISON:  April 12, 2024 FINDINGS: The heart size and mediastinal contours are within normal limits. Both lungs are clear. Postoperative changes are seen within the upper thoracic spine and visualized portion of the cervical spine. IMPRESSION: No active cardiopulmonary disease. Electronically Signed   By: Suzen Dials M.D.   On: 05/17/2024 01:09    Microbiology: Results for orders placed or performed during the hospital encounter of 02/17/24  Resp panel by RT-PCR (RSV, Flu A&B, Covid) Anterior Nasal Swab     Status: None   Collection Time:  02/17/24  4:53 AM   Specimen: Anterior Nasal Swab  Result Value Ref Range Status   SARS Coronavirus 2 by RT PCR NEGATIVE NEGATIVE Final    Comment: (NOTE) SARS-CoV-2 target nucleic acids are NOT DETECTED.  The SARS-CoV-2 RNA is generally detectable in upper respiratory specimens during the acute phase of infection. The lowest concentration of SARS-CoV-2 viral copies this assay can detect is 138 copies/mL. A negative result does not preclude SARS-Cov-2 infection and should not be used as the sole basis for treatment or other patient management decisions. A negative result may occur with  improper specimen collection/handling, submission of specimen other than nasopharyngeal swab, presence of viral mutation(s) within the areas targeted by this assay, and inadequate number of viral copies(<138 copies/mL). A negative result must be combined with clinical observations, patient history, and epidemiological information. The expected result is Negative.  Fact Sheet for Patients:  BloggerCourse.com  Fact Sheet for Healthcare Providers:  SeriousBroker.it  This test is no t yet approved or cleared by the [place]  FDA and  has been authorized for detection and/or diagnosis of SARS-CoV-2 by FDA under an Emergency Use Authorization (EUA). This EUA will remain  in effect (meaning this test can be used) for the duration of the COVID-19 declaration under Section 564(b)(1) of the Act, 21 U.S.C.section 360bbb-3(b)(1), unless the authorization is terminated  or revoked sooner.       Influenza A by PCR NEGATIVE NEGATIVE Final   Influenza B by PCR NEGATIVE NEGATIVE Final    Comment: (NOTE) The Xpert Xpress SARS-CoV-2/FLU/RSV plus assay is intended as an aid in the diagnosis of influenza from Nasopharyngeal swab specimens and should not be used as a sole basis for treatment. Nasal washings and aspirates are unacceptable for Xpert Xpress  SARS-CoV-2/FLU/RSV testing.  Fact Sheet for Patients: BloggerCourse.com  Fact Sheet for Healthcare Providers: SeriousBroker.it  This test is not yet approved or cleared by the [place]  FDA and has been authorized for detection and/or diagnosis of SARS-CoV-2 by FDA under an Emergency Use Authorization (EUA). This EUA will remain in effect (meaning this test can be used) for the duration of the COVID-19 declaration under Section 564(b)(1) of the Act, 21 U.S.C. section 360bbb-3(b)(1), unless the authorization is terminated or revoked.     Resp Syncytial Virus by PCR NEGATIVE NEGATIVE Final    Comment: (NOTE) Fact Sheet for Patients:  BloggerCourse.com  Fact Sheet for Healthcare Providers: SeriousBroker.it  This test is not yet approved or cleared by the [place]  FDA and has been authorized for detection and/or diagnosis of SARS-CoV-2 by FDA under an Emergency Use Authorization (EUA). This EUA will remain in effect (meaning this test can be used) for the duration of the COVID-19 declaration under Section 564(b)(1) of the Act, 21 U.S.C. section 360bbb-3(b)(1), unless the authorization is terminated or revoked.  Performed at [facility], [address] [place], [place] [address]     Labs: CBC: Recent Labs  Lab 06/04/24 1533 06/07/24 2357 06/08/24 0622 06/08/24 1833 06/09/24 0511  WBC 10.3 9.6 9.3 11.7* 10.5  HGB 17.1* 17.0 14.6 14.5 14.9  HCT 49.0 49.9 42.5 42.2 44.0  MCV 83.1 85.3 85.2 84.4 85.6  PLT 199 202 175 174 189   Basic Metabolic Panel: Recent Labs  Lab 06/04/24 1533 06/07/24 2357 06/08/24 0622 06/08/24 1833 06/09/24 0511  NA 135 139 140  --  137  K 3.5 3.5 3.5  --  4.0  CL 97* 105 102  --  104  CO2 23 23 24   --  24  GLUCOSE 221* 236* 242*  --  137*  BUN 13 12 12   --  11  CREATININE 1.23 1.48* 1.30* 1.19 1.37*  CALCIUM  9.3 8.9  8.5*  --  8.6*   Liver Function Tests: Recent Labs  Lab 06/04/24 1533 06/08/24 0143  AST 26 22  ALT 20 22  ALKPHOS 65 63  BILITOT 1.0 0.9  PROT 7.3 6.6  ALBUMIN 3.8 3.6   CBG: Recent Labs  Lab 06/08/24 1407 06/08/24 1849 06/08/24 2312 06/09/24 0715 06/09/24 1230  GLUCAP 131* 165* 165* 146* 134*    Discharge time spent: greater than 30 minutes.  Signed: Murvin Mana, MD [facility] 06/09/2024

## 2024-06-09 NOTE — Progress Notes (Signed)
 Rounding Note   Patient Name: Cory Rivas Date of Encounter: 06/09/2024  [facility] Cardiologist: Redell Cave, MD  Subjective Patient denies any further episodes of chest pain and shortness of breath. Cath site is stable without signs of bleeding or hematoma. Cr 1.37. Patient has been able to ambulate to the restroom without issue.  Scheduled Meds:  allopurinol   100 mg Oral Daily   amLODipine   10 mg Oral Daily   aspirin  EC  81 mg Oral Daily   bisoprolol   10 mg Oral Daily   DULoxetine   60 mg Oral BH-q7a   And   DULoxetine   30 mg Oral QPM   empagliflozin   10 mg Oral Daily   enoxaparin  (LOVENOX ) injection  40 mg Subcutaneous Q24H   ezetimibe   10 mg Oral Daily   fluticasone   1 spray Each Nare Daily   insulin  aspart  0-15 Units Subcutaneous TID WC   insulin  aspart  0-5 Units Subcutaneous QHS   insulin  glargine  20 Units Subcutaneous QHS   loratadine   10 mg Oral Daily   nicotine   21 mg Transdermal Daily   pantoprazole   40 mg Oral Daily   prasugrel   10 mg Oral Daily   ranolazine   500 mg Oral BID   rosuvastatin   40 mg Oral Daily   sodium chloride  flush  3 mL Intravenous Q12H   sucralfate   1 g Oral TID WC & HS   umeclidinium bromide   1 puff Inhalation Daily   Continuous Infusions:  sodium chloride      nitroGLYCERIN  Stopped (06/09/24 0435)   PRN Meds: sodium chloride , acetaminophen , ALPRAZolam , baclofen , cromolyn , ipratropium-albuterol , magnesium  hydroxide, morphine  injection, nicotine  polacrilex, nitroGLYCERIN , ondansetron  (ZOFRAN ) IV, pregabalin , sodium chloride  flush, traZODone    Vital Signs  Vitals:   06/09/24 0000 06/09/24 0200 06/09/24 0400 06/09/24 0700  BP: 130/74 133/79 138/84 (!) 154/86  Pulse: 63 67 68 78  Resp: 18 17 18 16   Temp:   98.2 F (36.8 C)   TempSrc:   Oral   SpO2: 94% 93% 93% 95%  Weight:      Height:        Intake/Output Summary (Last 24 hours) at 06/09/2024 0921 Last data filed at 06/09/2024 0319 Gross per 24 hour  Intake  1324.92 ml  Output 700 ml  Net 624.92 ml      06/07/2024   11:58 PM 05/29/2024    2:43 PM 05/17/2024   12:42 AM  Last 3 Weights  Weight (lbs) 220 lb 225 lb 227 lb  Weight (kg) 99.791 kg 102.059 kg 102.967 kg      Telemetry Sinus rhythm, rare PVCs - Personally Reviewed  ECG  Sinus rhythm with nonspecific ST/T wave abnormalities - Personally Reviewed  Physical Exam  GEN: No acute distress.   Neck: No JVD Cardiac: RRR, no murmurs, rubs, or gallops. Cath site is stable.  Respiratory: Faint expiratory wheezing GI: Soft, nontender, non-distended  MS: No edema; No deformity. Neuro:  Nonfocal  Psych: Normal affect   Labs High Sensitivity Troponin:   Recent Labs  Lab 05/17/24 0944 05/17/24 1142 06/07/24 2357 06/08/24 0143 06/08/24 0936  TROPONINIHS 65* 64* 19* 47* 1,696*     Chemistry Recent Labs  Lab 06/04/24 1533 06/07/24 2357 06/08/24 0143 06/08/24 0622 06/08/24 1833 06/09/24 0511  NA 135 139  --  140  --  137  K 3.5 3.5  --  3.5  --  4.0  CL 97* 105  --  102  --  104  CO2 23  23  --  24  --  24  GLUCOSE 221* 236*  --  242*  --  137*  BUN 13 12  --  12  --  11  CREATININE 1.23 1.48*  --  1.30* 1.19 1.37*  CALCIUM  9.3 8.9  --  8.5*  --  8.6*  PROT 7.3  --  6.6  --   --   --   ALBUMIN 3.8  --  3.6  --   --   --   AST 26  --  22  --   --   --   ALT 20  --  22  --   --   --   ALKPHOS 65  --  63  --   --   --   BILITOT 1.0  --  0.9  --   --   --   GFRNONAA >60 55*  --  >60 >60 >60  ANIONGAP 15 11  --  14  --  9    Lipids  Recent Labs  Lab 06/08/24 0622  CHOL 181  TRIG 208*  HDL 38*  LDLCALC 101*  CHOLHDL 4.8    Hematology Recent Labs  Lab 06/08/24 0622 06/08/24 1833 06/09/24 0511  WBC 9.3 11.7* 10.5  RBC 4.99 5.00 5.14  HGB 14.6 14.5 14.9  HCT 42.5 42.2 44.0  MCV 85.2 84.4 85.6  MCH 29.3 29.0 29.0  MCHC 34.4 34.4 33.9  RDW 13.2 13.3 13.3  PLT 175 174 189   Thyroid No results for input(s): TSH, FREET4 in the last 168 hours.  BNPNo  results for input(s): BNP, PROBNP in the last 168 hours.  DDimer No results for input(s): DDIMER in the last 168 hours.   Radiology  CT Angio Chest/Abd/Pel for Dissection W and/or Wo Contrast Result Date: 06/08/2024 IMPRESSION: CTA of the chest: No evidence of aortic abnormality. No pulmonary embolism identified. CTA of the abdomen and pelvis: No arterial abnormality is noted. Diverticulosis without diverticulitis. Electronically Signed   By: Oneil Devonshire M.D.   On: 06/08/2024 01:09   DG Chest Port 1 View Result Date: 06/08/2024 IMPRESSION: No active disease. Electronically Signed   By: Morgane  Naveau M.D.   On: 06/08/2024 00:20   Cardiac Studies  06/08/2024 LHC 1.  Severe one-vessel coronary artery disease with evidence of hazy stenosis in the proximal LAD at the bifurcation of a large diagonal branch likely due to plaque rupture.  Stable moderate ostial LAD and OM1 stenosis. 2.  Left ventricular angiography was not performed.  EF was normal by echo. 3.  Moderately severely elevated left ventricular end-diastolic pressure at 28 to 30 mmHg. 4.  Difficult but successful OCT guided angioplasty and drug-eluting stent placement to the LAD with balloon angioplasty to the ostial diagonal .  Difficult procedure overall due to bifurcation involvement and significant difficulty in wiring the LAD due to steep angulation after the diagonal origin.  06/08/2024 Echo complete 1. Left ventricular ejection fraction, by estimation, is 55 to 60%. The  left ventricle has normal function. The left ventricle has no regional  wall motion abnormalities. There is moderate left ventricular hypertrophy.  Left ventricular diastolic  parameters are consistent with Grade II diastolic dysfunction  (pseudonormalization).   2. Right ventricular systolic function is normal. The right ventricular  size is normal. Tricuspid regurgitation signal is inadequate for assessing  PA pressure.   3. Left atrial size was mildly  dilated.   4. The mitral valve is normal in structure.  Mild mitral valve  regurgitation. No evidence of mitral stenosis.   5. The aortic valve is normal in structure. Aortic valve regurgitation is  not visualized. Aortic valve sclerosis/calcification is present, without  any evidence of aortic stenosis.   Patient Profile   58 y.o. male with a hx of moderate nonobstructive CAD, chest pain, chronic HFpEF, stage IIIa chronic kidney disease, insulin -dependent diabetes, hypertension, hyperlipidemia, morbid obesity, tobacco abuse, COPD, gout, sleep apnea, tracheobronchomalacia, and cervical myelopathy who was admitted 8/25 who is being seen for the ongoing management of unstable angina.   Assessment & Plan   CAD Unstable angina - Presenting with substernal chest tightness radiating to left arm and neck - Echo showed preserved LV systolic function without RWMA - Troponin up to 1696 - Cardiac catheterization revealed severe one vessel CAD with evidence of hazy stenosis in the proximal LAD at the bifurcation of a large diag branch likely due to plaque rupture with successful PCI/DES to the LAD and PCI to the ostial diagonal - Cath site is stable - Continue DAPT with ASA and prasugrel  for at least 12 months - Continue bisoprolol  and Ranexa   Chronic HFpEF - Echo this admission with EF 55-60% and G2DD - EDP 28-30 mmHg on cath yesterday - He is continued on bisoprolol  and Jardiance  - PTA spironolactone , torsemide , and valsartan  held in the setting of renal dysfunction. Recommend resuming tomorrow. - Recommend net even to slightly negative fluid balance  Hypertension - BP mildly elevated - Spironolactone , torsemide , and valsartan  held as above. Plan to resume on discharge.   Hyperlipidemia - LDL 101, continue statin and ezetimibe  - Would likely benefit from PCSK9i as outpatient  CKD IIIa - Cr 1.37 post cath  Tobacco use - Recommend cessation  For questions or updates, please contact [facility] Please consult [email or web address] for contact info under     Signed, Lesley LITTIE Maffucci, PA-C  06/09/2024, 9:21 AM

## 2024-06-10 ENCOUNTER — Ambulatory Visit: Admitting: Physical Therapy

## 2024-06-16 ENCOUNTER — Ambulatory Visit: Admitting: Physical Therapy

## 2024-06-22 ENCOUNTER — Ambulatory Visit: Admitting: Physical Therapy

## 2024-06-22 NOTE — Progress Notes (Unsigned)
 Cardiology Office Note:    Date:  06/23/2024   ID:  Cory Rivas, DOB 28-Aug-1966, MRN [id number]  PCP:  Cory Mole, PA-C   [facility] Providers Cardiologist:  Cory Cave, MD { Click to update primary MD,subspecialty MD or APP then REFRESH:1}    Referring MD: Cory Mole, PA-C   Chief complaint: Hospital follow-up, NSTEMI, PCI/DES  History of Present Illness:    Cory Rivas is a 58 y.o. male with history of nonobstructive CAD, HFpEF, CKD stage IIIa, IDDM, HTN, HLD, cervical myelopathy, idiopathic chronic gout, tobacco use for 35+years, asthma, obesity with OSA and tracheobronchomalacia who presents for hospital follow-up following an NSTEMI and PCI/DES  Preop eval l/t Lexiscan  stress test in 10/03/2020, showing no significant ischemia or coronary artery calcification on CT imaging.  Echo 09/03/2022 showed EF 60-65%, no RWMA, mild LVH, G2 DD, normal RV systolic function and ventricular size, mild mitral regurgitation and aortic sclerosis without stenosis.  Admitted to the hospital 05/2023, at bedtime troponin 26> 24, BNP 199.  Echo 05/28/2023 showed LVEF 50-55%, moderate LVH, G3 DD.  R/LHC on 8/14 showed moderate nonobstructive CAD throughout LAD, nondominant RCA without significant disease.  Mildly elevated left and right heart filling pressures.  Mild PHTN, borderline low cardiac output/index.  Diuresis with medical therapy recommended.  Admitted to the hospital 09/04/2023 with hypertensive urgency, BP 195/95.  Improved with IV Lasix  and BP treatment.  Admitted to [facility] in January 2025 and acute hypoxic respiratory failure secondary to COPD exacerbation and acute on chronic HFpEF with admission complicated by acute on CKD stage IIIa.  Treated with supplemental oxygen, IV Lasix , Solu-Medrol , DuoNebs, Rocephin , azithromycin .  At discharge, lisinopril , spironolactone , torsemide  held due to AKI.  Patient complained of chest discomfort and dizziness at discharge and at  follow-up cardiology appointment, this led to [facility] on 10/30/2023, with results not significantly different when compared to cath in 05/2023.  Admitted 02/17/2024 for respiratory distress, acute on chronic hypoxic respiratory failure, acute on chronic HFpEF decompensation, AKI.  Bisoprolol , lisinopril , torsemide  held following AKI and bradycardia.    Followed by advanced HF clinic who has been titrating medical therapies.  Seen in the ED 3 times in June for similar complaints, symptoms typically improve following dieresis.  Cardiology consulted for same-day admission and discharge on 05/17/2024 for nonspecific chest pain with troponins in the 60s and flat, CP shortly resolved after presentation, cardiology cleared for discharge.  Most recently admitted to the hospital from 8/25-8/26 for an NSTEMI. Presented to the ED complaining of central chest pain, peak troponin 1696, loaded with aspirin , started on heparin  infusion. LHC with PCI on 06/08/2024 showed proximal LAD-mid LAD 95% stenosed, an Onyx frontier 3.0 X15 was placed with TIMI-3 flow postprocedure.  Angioplasty was performed on first diagonal lesion, preprocedure 60% stenosed, 10% residual stenosis post intervention.  Procedure performed via right radial artery.  DAPT with aspirin  81 mg daily and prasugrel  10 mg daily for minimum of 1 year was recommended.  Echo 8/25 showed LVEF 55-60%, normal LV function, no RWMA, moderate LVH, G2 DD, RV SF normal, RV size normal, LA mildly dilated, mild mitral regurg, aortic valve sclerosis without stenosis present.  Patient appears to be feeling better since his hospital discharge.  Denies chest pain, shortness of breath, dyspnea on exertion, orthopnea, dizziness, lightheadedness, near-syncope, headache, blurry vision, nausea, bloody/dark/tarry stools, leg swelling.  States he is able to get up and do most activities around the house, has not tried performed strenuous activities. Reports his BP  usually runs high. Denies  headache, blurred vision. Requesting to restart his physical therapy for his neck/shoulder injury from flipping his tractor last year, but states they need a clearance note from our office to do so.    Past Medical History:  Diagnosis Date   Bulging of cervical intervertebral disc    CAD (coronary artery disease)    a. 09/2020 MV: low risk; b. 05/2023 Cath: Moderate LAD dzs w/ seq 50-60% stenoses and nl RFR 0.93. Mod OM dzs->Med rx; b. 10/2023 Cath: LM min irregs, LAD 55ost, 55p/m, LCX large nl, OM1 50, RCA mod, nl.   Cervical myelopathy (HCC)    Chronic heart failure with preserved ejection fraction (HFpEF) (HCC)    a. 05/2023 Echo: EF 50-55%, no rwma, GrIII DD, nl RV fxn, mild MR, Ao sclerosis; b. 10/2023 Echo: EF 55-60%, no rwma, nl RV fxn, mild MR, Ao sclerosis.   CKD (chronic kidney disease), stage III (HCC)    COPD (chronic obstructive pulmonary disease) (HCC)    Diabetes mellitus without complication (HCC)    Gout    Hyperlipidemia LDL goal <70    Hypertension    Morbid obesity (HCC)    OSA (obstructive sleep apnea)    Stress due to family tension 04/15/2019   Tracheobronchomalacia     Past Surgical History:  Procedure Laterality Date   BACK SURGERY     CARDIAC CATHETERIZATION     CHOLECYSTECTOMY     COLONOSCOPY WITH PROPOFOL  N/A 03/10/2020   Procedure: COLONOSCOPY WITH PROPOFOL ;  Surgeon: Cory Carmine, MD;  Location: [facility];  Service: Endoscopy;  Laterality: N/A;   COLONOSCOPY WITH PROPOFOL  N/A 09/25/2023   Procedure: COLONOSCOPY WITH PROPOFOL ;  Surgeon: Cory Carmine, MD;  Location: [facility];  Service: Endoscopy;  Laterality: N/A;   CORONARY IMAGING/OCT N/A 06/08/2024   Procedure: CORONARY IMAGING/OCT;  Surgeon: Cory Deatrice LABOR, MD;  Location: [facility];  Service: Cardiovascular;  Laterality: N/A;   CORONARY PRESSURE/FFR STUDY N/A 05/29/2023   Procedure: CORONARY PRESSURE/FFR STUDY;  Surgeon: Cory Bruckner, MD;  Location: [facility];   Service: Cardiovascular;  Laterality: N/A;   CORONARY STENT INTERVENTION N/A 06/08/2024   Procedure: CORONARY STENT INTERVENTION;  Surgeon: Cory Deatrice LABOR, MD;  Location: [facility];  Service: Cardiovascular;  Laterality: N/A;   LEFT HEART CATH AND CORONARY ANGIOGRAPHY N/A 06/08/2024   Procedure: LEFT HEART CATH AND CORONARY ANGIOGRAPHY;  Surgeon: Cory Deatrice LABOR, MD;  Location: [facility];  Service: Cardiovascular;  Laterality: N/A;   POLYPECTOMY  09/25/2023   Procedure: POLYPECTOMY;  Surgeon: Cory Carmine, MD;  Location: [facility];  Service: Endoscopy;;   RIGHT/LEFT HEART CATH AND CORONARY ANGIOGRAPHY N/A 05/29/2023   Procedure: RIGHT/LEFT HEART CATH AND CORONARY ANGIOGRAPHY;  Surgeon: Cory Bruckner, MD;  Location: [facility];  Service: Cardiovascular;  Laterality: N/A;   RIGHT/LEFT HEART CATH AND CORONARY ANGIOGRAPHY Bilateral 10/30/2023   Procedure: RIGHT/LEFT HEART CATH AND CORONARY ANGIOGRAPHY;  Surgeon: Cory Bruckner, MD;  Location: [facility];  Service: Cardiovascular;  Laterality: Bilateral;    Current Medications: Current Meds  Medication Sig   allopurinol  (ZYLOPRIM ) 100 MG tablet Take 1 tablet (100 mg total) by mouth daily with the 300mg  tablet for a total daily dose of 400mg .   allopurinol  (ZYLOPRIM ) 300 MG tablet Take 1 tablet (300 mg total) by mouth daily as needed (Gout flare). Home med.   amLODipine  (NORVASC ) 10 MG tablet Take 1 tablet (10 mg total) by mouth daily.  aspirin  EC 81 MG tablet Take 1 tablet (81 mg total) by mouth daily. Swallow whole.   baclofen  (LIORESAL ) 10 MG tablet Take 1 tablet (10 mg total) by mouth 3 (three) times daily as needed for muscle spasms.   bisoprolol  (ZEBETA ) 5 MG tablet Take 2 tablets (10 mg total) by mouth daily.   budesonide -formoterol  (SYMBICORT ) 160-4.5 MCG/ACT inhaler Inhale 2 puffs into the lungs 2 (two) times daily.   cetirizine  (ZYRTEC ) 10 MG tablet Take 1 tablet (10 mg total) by mouth  daily.   Continuous Glucose Sensor (FREESTYLE LIBRE 3 SENSOR) MISC Place 1 sensor onto skin every 14 (fourteen) days to monitor blood sugar continuously   cromolyn  (OPTICROM ) 4 % ophthalmic solution Place 1 drop into both eyes 4 (four) times daily as needed.   Dulaglutide  (TRULICITY ) 3 MG/0.5ML SOAJ Inject 3 mg into the skin once a week.   DULoxetine  (CYMBALTA ) 30 MG capsule Take 2 capsules (60 mg total) by mouth every morning AND 1 capsule (30 mg total) every evening.   empagliflozin  (JARDIANCE ) 10 MG TABS tablet Take 1 tablet (10 mg total) by mouth daily.   Evolocumab  (REPATHA  SURECLICK) 140 MG/ML SOAJ Inject 140 mg into the skin every 14 (fourteen) days.   ezetimibe  (ZETIA ) 10 MG tablet Take 1 tablet (10 mg total) by mouth daily.   fluticasone  (FLONASE ) 50 MCG/ACT nasal spray Place 1 spray into both nostrils daily.   insulin  glargine (LANTUS  SOLOSTAR) 100 UNIT/ML Solostar Pen Inject 20-25 Units into the skin at bedtime.   insulin  lispro (HUMALOG ) 100 UNIT/ML KwikPen Inject 10 Units into the skin 3 (three) times daily. Plus sliding scale up per instructions   ipratropium-albuterol  (DUONEB) 0.5-2.5 (3) MG/3ML SOLN Take 3 mLs by nebulization every 6 (six) hours as needed.   levocetirizine (XYZAL ) 5 MG tablet Take 1 tablet (5 mg total) by mouth every evening.   nicotine  (NICODERM CQ  - DOSED IN MG/24 HOURS) 21 mg/24hr patch Place 1 patch (21 mg total) onto the skin daily.   nicotine  polacrilex (NICOTINE  MINI) 4 MG lozenge Take 1 lozenge (4 mg total) by mouth as needed.   pantoprazole  (PROTONIX ) 40 MG tablet Take 1 tablet (40 mg total) by mouth daily.   prasugrel  (EFFIENT ) 10 MG TABS tablet Take 1 tablet (10 mg total) by mouth daily.   pregabalin  (LYRICA ) 50 MG capsule Take 1 capsule (50 mg total) by mouth 2 (two) times daily as needed. Home med.   ranolazine  (RANEXA ) 500 MG 12 hr tablet Take 1 tablet (500 mg total) by mouth 2 (two) times daily.   rosuvastatin  (CRESTOR ) 40 MG tablet Take 1 tablet (40  mg total) by mouth daily.   spironolactone  (ALDACTONE ) 25 MG tablet Take 1 tablet (25 mg total) by mouth daily.   sucralfate  (CARAFATE ) 1 g tablet Take 1 tablet (1 g total) by mouth 4 (four) times daily -  with meals and at bedtime.   Tiotropium Bromide  Monohydrate (SPIRIVA  RESPIMAT) 2.5 MCG/ACT AERS Inhale 2 puffs into the lungs daily.   torsemide  (DEMADEX ) 20 MG tablet Take 2 tablets (40 mg total) by mouth daily.   valsartan  (DIOVAN ) 80 MG tablet Take 1 tablet (80 mg total) by mouth daily.   VENTOLIN  HFA 108 (90 Base) MCG/ACT inhaler Inhale 2 puffs into the lungs every 6 (six) hours as needed for wheezing or shortness of breath.     Allergies:   Oxycodone , Entresto  [sacubitril -valsartan ], Onion, and Nitroglycerin    Social History   Socioeconomic History   Marital status: Divorced  Spouse name: Not on file   Number of children: 0   Years of education: 9   Highest education level: 9th grade  Occupational History   Occupation: disability  Tobacco Use   Smoking status: Every Day    Current packs/day: 0.00    Average packs/day: 1 pack/day for 37.0 years (37.0 ttl pk-yrs)    Types: Cigarettes    Start date: 10/19/1989    Last attempt to quit: 08/20/2022    Years since quitting: 1.8   Smokeless tobacco: Never   Tobacco comments:    30+ years hx smoking 2 ppd, stopped smoking briefly in April 2022 for a surgery and then restarted smoking.  As of August 2025, he smoking about 2 to 4 cigarettes a day.  Vaping Use   Vaping status: Never Used  Substance and Sexual Activity   Alcohol use: No   Drug use: No   Sexual activity: Not Currently    Partners: Female  Other Topics Concern   Not on file  Social History Narrative   Lives with sister [name], brother in law [name]), and his mother - Krist Rosenboom   Does not routinely exercise.   Social Drivers of Health   Financial Resource Strain: High Risk (01/21/2024)   Overall Financial Resource Strain (CARDIA)    Difficulty of Paying Living  Expenses: Hard  Food Insecurity: No Food Insecurity (04/04/2024)   Hunger Vital Sign    Worried About Running Out of Food in the Last Year: Never true    Ran Out of Food in the Last Year: Never true  Transportation Needs: No Transportation Needs (04/04/2024)   PRAPARE - Administrator, Civil Service (Medical): No    Lack of Transportation (Non-Medical): No  Physical Activity: Insufficiently Active (12/06/2022)   Exercise Vital Sign    Days of Exercise per Week: 3 days    Minutes of Exercise per Session: 10 min  Stress: No Stress Concern Present (12/06/2022)   [employer] of Occupational Health - Occupational Stress Questionnaire    Feeling of Stress : Only a little  Social Connections: Socially Isolated (02/17/2024)   Social Connection and Isolation Panel    Frequency of Communication with Friends and Family: More than three times a week    Frequency of Social Gatherings with Friends and Family: Twice a week    Attends Religious Services: Never    Database administrator or Organizations: No    Attends Engineer, structural: Never    Marital Status: Divorced     Family History: The patient's family history includes AAA (abdominal aortic aneurysm) in his maternal grandmother; Diabetes in his maternal grandfather and mother; Heart attack in his maternal grandmother and mother; Heart disease in his mother; Hyperlipidemia in his mother; Hypertension in his father and mother; Lung cancer in his father; Stroke in his father.  ROS:   Please see the history of present illness.    All other systems reviewed and are negative.  EKGs/Labs/Other Studies Reviewed:    The following studies were reviewed today:  EKG Interpretation Date/Time:  Tuesday June 23 2024 13:53:49 EDT Ventricular Rate:  68 PR Interval:  156 QRS Duration:  76 QT Interval:  378 QTC Calculation: 401 R Axis:   31  Text Interpretation: Normal sinus rhythm ST and T wave abnormality in  inferolateral leads, unchanged from previous studies When compared with ECG of 09-Jun-2024 05:44, No significant change was found Confirmed by Tenia Goh [phone]) on 06/23/2024 3:28:28 PM  Recent Labs: 02/19/2024: Magnesium  2.5 05/17/2024: B Natriuretic Peptide 32.4 06/08/2024: ALT 22 06/09/2024: BUN 11; Creatinine, Ser 1.37; Hemoglobin 14.9; Platelets 189; Potassium 4.0; Sodium 137  Recent Lipid Panel    Component Value Date/Time   CHOL 181 06/08/2024 0622   CHOL 178 11/15/2023 0943   TRIG 208 (H) 06/08/2024 0622   HDL 38 (L) 06/08/2024 0622   HDL 41 11/15/2023 0943   CHOLHDL 4.8 06/08/2024 0622   VLDL 42 (H) 06/08/2024 0622   LDLCALC 101 (H) 06/08/2024 0622   LDLCALC 101 (H) 11/15/2023 0943   LDLCALC  08/09/2023 1156     Comment:     . LDL cholesterol not calculated. Triglyceride levels greater than 400 mg/dL invalidate calculated LDL results. . Reference range: <100 . Desirable range <100 mg/dL for primary prevention;   <70 mg/dL for patients with CHD or diabetic patients  with > or = 2 CHD risk factors. SABRA LDL-C is now calculated using the Martin-Hopkins  calculation, which is a validated novel method providing  better accuracy than the Friedewald equation in the  estimation of LDL-C.  Gladis APPLETHWAITE et al. SANDREA. 7986;689(80): 2061-2068  (http://education.[employer].com/faq/FAQ164)    LDLDIRECT 106 (H) 11/15/2023 9056   LDLDIRECT 147 (H) 08/19/2023 2222    Physical Exam:    VS:  BP (!) 148/101 (BP Location: Left Arm, Patient Position: Sitting, Cuff Size: Normal)   Pulse 68   Ht 5' 7 (1.702 m)   Wt 226 lb 12.8 oz (102.9 kg)   SpO2 96%   BMI 35.52 kg/m     Wt Readings from Last 3 Encounters:  06/23/24 226 lb 12.8 oz (102.9 kg)  06/07/24 220 lb (99.8 kg)  05/29/24 225 lb (102.1 kg)     GEN:  Well nourished, well developed in no acute distress HEENT: Normal NECK:No carotid bruits CARDIAC: RRR, no murmurs, rubs, gallops RESPIRATORY:  Clear to auscultation  without rales, wheezing or rhonchi  MUSCULOSKELETAL: +1 nonpitting LE edema bilaterally at baseline, no deformity  SKIN: Warm and dry NEUROLOGIC:  Alert and oriented x 3 PSYCHIATRIC:  Normal affect   ASSESSMENT:    1. Non-ST elevation (NSTEMI) myocardial infarction (HCC)   2. NICM (nonischemic cardiomyopathy) (HCC)   3. Chronic heart failure with preserved ejection fraction (HCC)   4. Primary hypertension   5. Mixed hyperlipidemia    PLAN:    In order of problems listed above:  NSTEMI CAD s/p PCI/DES LAD  Denies chest pain, shortness of breath, palpitations Right radial site appears well-healed, no bruising/ecchymosis, hematoma, right radial pulse 2+  Denies new bleeding/bruising on DAPT  Reports strict medication adherence, has not missed any blood thinners  Continue aspirin  81 mg daily  Continue prasugrel  10 mg daily X duration 1 year following PCI  Discontinue Ranexa  following PCI  May start cardiac rehab  NICM with preserved EF  NYHA class II Appears euvolemic on exam, +1 nonpitting LE edema bilaterally at baseline present, clear lung sounds Denies recent change in weight Reminded to call for an overnight weight gain of >2 pounds or a weekly weight gain of > 5 pounds  Continue GDMT:  Continue bisoprolol  10mg  daily, ADD an additional 5 mg at nighttime for better BP control Continue jardiance  10mg  every day Continue torsemide  40mg  daily   Continue spironolactone  25mg  daily Continue valsartan  80mg  daily  HTN  Start home BP log and return it to office in 2 weeks  BMET 06/09/2024: Creatinine 1.37, GFR> 60  Continue valsartan  80 mg daily  Continue  amlodipine  10 mg daily  Continue bisoprolol  10 mg daily, ADD an additional 5 mg at nighttime for better BP control  Hyperlipidemia  Lipid panel 06/08/2024: Cholesterol 181, HDL 38, LDL 101, triglycerides 208  LFTs: AST 22, ALT 22  Continue ezetimibe  10mg  daily   Continue rosuvastatin  40mg  daily  Continue repatha  140  mg/ml  Follow-up with advanced heart failure clinic as directed Follow-up with general cardiology in 3 months  {The patient has an active order for outpatient cardiac rehabilitation.   Please indicate if the patient is ready to start. Do NOT delete this.  It will auto delete.  Refresh note, then sign.              Click here to document readiness and see contraindications.  :1}  Cardiac Rehabilitation Eligibility Assessment  The patient is ready to start cardiac rehabilitation from a cardiac standpoint.     Medication Adjustments/Labs and Tests Ordered: Current medicines are reviewed at length with the patient today.  Concerns regarding medicines are outlined above.  Orders Placed This Encounter  Procedures   EKG 12-Lead   Meds ordered this encounter  Medications   bisoprolol  (ZEBETA ) 5 MG tablet    Sig: Take 2 tablets (10 mg total) by mouth every morning AND 1 tablet (5 mg total) at bedtime.    Dispense:  270 tablet    Refill:  3    Patient Instructions  Medication Instructions:  Your physician recommends the following medication changes.  STOP TAKING: Ranexa   INCREASE: Bisoprolol  5 mg extra at night for a total of 10 mg in the morning and 5 mg at night *If you need a refill on your cardiac medications before your next appointment, please call your pharmacy*  Lab Work: None ordered at this time    Follow-Up: At [facility], you and your health needs are our priority.  As part of our continuing mission to provide you with exceptional heart care, our providers are all part of one team.  This team includes your primary Cardiologist (physician) and Advanced Practice Providers or APPs (Physician Assistants and Nurse Practitioners) who all work together to provide you with the care you need, when you need it.  Your next appointment:   1 month(s)  Provider:   You may see Cory Cave, MD or Bernardino Bring, PA-C  Please take your blood pressure twice daily  until returning in 1 month.   Signed, Miriam FORBES Shams, NP  06/23/2024 10:40 PM    [facility]

## 2024-06-23 ENCOUNTER — Ambulatory Visit: Attending: Physician Assistant | Admitting: Emergency Medicine

## 2024-06-23 ENCOUNTER — Other Ambulatory Visit: Payer: Self-pay

## 2024-06-23 ENCOUNTER — Encounter: Payer: Self-pay | Admitting: Physician Assistant

## 2024-06-23 VITALS — BP 148/101 | HR 68 | Ht 67.0 in | Wt 226.8 lb

## 2024-06-23 DIAGNOSIS — I428 Other cardiomyopathies: Secondary | ICD-10-CM | POA: Diagnosis not present

## 2024-06-23 DIAGNOSIS — I5032 Chronic diastolic (congestive) heart failure: Secondary | ICD-10-CM | POA: Diagnosis not present

## 2024-06-23 DIAGNOSIS — I10 Essential (primary) hypertension: Secondary | ICD-10-CM | POA: Diagnosis not present

## 2024-06-23 DIAGNOSIS — I214 Non-ST elevation (NSTEMI) myocardial infarction: Secondary | ICD-10-CM | POA: Insufficient documentation

## 2024-06-23 DIAGNOSIS — E782 Mixed hyperlipidemia: Secondary | ICD-10-CM | POA: Insufficient documentation

## 2024-06-23 LAB — HELIX PHARMACOGENOMICS (PGX) CLOPIDOGREL TEST

## 2024-06-23 MED ORDER — BISOPROLOL FUMARATE 5 MG PO TABS
ORAL_TABLET | ORAL | 3 refills | Status: AC
  Filled 2024-06-23 – 2024-07-14 (×2): qty 270, 90d supply, fill #0
  Filled 2024-08-25: qty 90, 30d supply, fill #1

## 2024-06-23 NOTE — Patient Instructions (Signed)
 Medication Instructions:  Your physician recommends the following medication changes.  STOP TAKING: Ranexa   INCREASE: Bisoprolol  5 mg extra at night for a total of 10 mg in the morning and 5 mg at night *If you need a refill on your cardiac medications before your next appointment, please call your pharmacy*  Lab Work: None ordered at this time    Follow-Up: At [facility], you and your health needs are our priority.  As part of our continuing mission to provide you with exceptional heart care, our providers are all part of one team.  This team includes your primary Cardiologist (physician) and Advanced Practice Providers or APPs (Physician Assistants and Nurse Practitioners) who all work together to provide you with the care you need, when you need it.  Your next appointment:   1 month(s)  Provider:   You may see Redell Cave, MD or Bernardino Bring, PA-C  Please take your blood pressure twice daily until returning in 1 month.

## 2024-06-25 ENCOUNTER — Encounter: Payer: Self-pay | Admitting: Physical Therapy

## 2024-06-25 ENCOUNTER — Ambulatory Visit: Admitting: Physical Therapy

## 2024-06-25 VITALS — BP 188/80 | HR 61

## 2024-06-25 DIAGNOSIS — R293 Abnormal posture: Secondary | ICD-10-CM | POA: Insufficient documentation

## 2024-06-25 DIAGNOSIS — M6281 Muscle weakness (generalized): Secondary | ICD-10-CM | POA: Diagnosis present

## 2024-06-25 DIAGNOSIS — M62838 Other muscle spasm: Secondary | ICD-10-CM | POA: Diagnosis present

## 2024-06-25 DIAGNOSIS — M5412 Radiculopathy, cervical region: Secondary | ICD-10-CM | POA: Diagnosis present

## 2024-06-25 DIAGNOSIS — M542 Cervicalgia: Secondary | ICD-10-CM | POA: Insufficient documentation

## 2024-06-25 NOTE — Therapy (Signed)
 OUTPATIENT PHYSICAL THERAPY TREATMENT   Patient Name: Cory Rivas MRN: [id number] DOB:07/08/1966, 58 y.o., male Today's Date: 06/25/2024  END OF SESSION:  PT End of Session - 06/25/24 1042     Visit Number 5    Number of Visits 17    Date for PT Re-Evaluation 08/11/24    Authorization Type [place] MEDICAID HEALTHY BLUE reporting period from 05/19/2024    Authorization Time Period HB auth#[id number] for 13 PT vsts from 8/7-11/5    Authorization - Visit Number 4    Authorization - Number of Visits 13    Progress Note Due on Visit 10    PT Start Time 1033    PT Stop Time 1148    PT Time Calculation (min) 75 min    Activity Tolerance Treatment limited secondary to medical complications (Comment)    Behavior During Therapy [facility] for tasks assessed/performed              Past Medical History:  Diagnosis Date   Bulging of cervical intervertebral disc    CAD (coronary artery disease)    a. 09/2020 MV: low risk; b. 05/2023 Cath: Moderate LAD dzs w/ seq 50-60% stenoses and nl RFR 0.93. Mod OM dzs->Med rx; b. 10/2023 Cath: LM min irregs, LAD 55ost, 55p/m, LCX large nl, OM1 50, RCA mod, nl.   Cervical myelopathy (HCC)    Chronic heart failure with preserved ejection fraction (HFpEF) (HCC)    a. 05/2023 Echo: EF 50-55%, no rwma, GrIII DD, nl RV fxn, mild MR, Ao sclerosis; b. 10/2023 Echo: EF 55-60%, no rwma, nl RV fxn, mild MR, Ao sclerosis.   CKD (chronic kidney disease), stage III (HCC)    COPD (chronic obstructive pulmonary disease) (HCC)    Diabetes mellitus without complication (HCC)    Gout    Hyperlipidemia LDL goal <70    Hypertension    Morbid obesity (HCC)    OSA (obstructive sleep apnea)    Stress due to family tension 04/15/2019   Tracheobronchomalacia    Past Surgical History:  Procedure Laterality Date   BACK SURGERY     CARDIAC CATHETERIZATION     CHOLECYSTECTOMY     COLONOSCOPY WITH PROPOFOL  N/A 03/10/2020   Procedure: COLONOSCOPY WITH PROPOFOL ;  Surgeon: Jinny Carmine, MD;  Location: [facility] ENDOSCOPY;  Service: Endoscopy;  Laterality: N/A;   COLONOSCOPY WITH PROPOFOL  N/A 09/25/2023   Procedure: COLONOSCOPY WITH PROPOFOL ;  Surgeon: Jinny Carmine, MD;  Location: [facility] ENDOSCOPY;  Service: Endoscopy;  Laterality: N/A;   CORONARY IMAGING/OCT N/A 06/08/2024   Procedure: CORONARY IMAGING/OCT;  Surgeon: Darron Deatrice LABOR, MD;  Location: [facility] INVASIVE CV LAB;  Service: Cardiovascular;  Laterality: N/A;   CORONARY PRESSURE/FFR STUDY N/A 05/29/2023   Procedure: CORONARY PRESSURE/FFR STUDY;  Surgeon: Mady Bruckner, MD;  Location: [facility] INVASIVE CV LAB;  Service: Cardiovascular;  Laterality: N/A;   CORONARY STENT INTERVENTION N/A 06/08/2024   Procedure: CORONARY STENT INTERVENTION;  Surgeon: Darron Deatrice LABOR, MD;  Location: [facility] INVASIVE CV LAB;  Service: Cardiovascular;  Laterality: N/A;   LEFT HEART CATH AND CORONARY ANGIOGRAPHY N/A 06/08/2024   Procedure: LEFT HEART CATH AND CORONARY ANGIOGRAPHY;  Surgeon: Darron Deatrice LABOR, MD;  Location: [facility] INVASIVE CV LAB;  Service: Cardiovascular;  Laterality: N/A;   POLYPECTOMY  09/25/2023   Procedure: POLYPECTOMY;  Surgeon: Jinny Carmine, MD;  Location: [facility] ENDOSCOPY;  Service: Endoscopy;;   RIGHT/LEFT HEART CATH AND CORONARY ANGIOGRAPHY N/A 05/29/2023   Procedure: RIGHT/LEFT HEART CATH AND CORONARY ANGIOGRAPHY;  Surgeon: Mady Bruckner, MD;  Location: [facility] INVASIVE CV LAB;  Service: Cardiovascular;  Laterality: N/A;   RIGHT/LEFT HEART CATH AND CORONARY ANGIOGRAPHY Bilateral 10/30/2023   Procedure: RIGHT/LEFT HEART CATH AND CORONARY ANGIOGRAPHY;  Surgeon: Mady Bruckner, MD;  Location: [facility] INVASIVE CV LAB;  Service: Cardiovascular;  Laterality: Bilateral;   Patient Active Problem List   Diagnosis Date Noted   Chest pain 06/08/2024   Hypertensive urgency 06/08/2024   Non-ST elevation (NSTEMI) myocardial infarction (HCC) 06/08/2024   Chronic kidney disease, stage 3a (HCC) 05/17/2024   Stable angina (HCC) 05/17/2024    Acute on chronic diastolic (congestive) heart failure (HCC) 04/03/2024   Class 3 obesity 02/18/2024   Nicotine  dependence 01/23/2024   Acute pulmonary edema (HCC) 01/22/2024   CHF (congestive heart failure) (HCC) 01/20/2024   COPD exacerbation (HCC) 10/21/2023   History of colon polyps 09/25/2023   Myocardial injury 08/19/2023   Type II diabetes mellitus with renal manifestations (HCC) 08/19/2023   Obesity (BMI 30-39.9) 08/19/2023   HLD (hyperlipidemia) 08/19/2023   Chronic diastolic CHF (congestive heart failure) (HCC) 08/19/2023   Diarrhea 08/19/2023   CAD (coronary artery disease) 08/19/2023   CKD (chronic kidney disease) stage 2, GFR 60-89 ml/min 08/19/2023   Acute on chronic diastolic heart failure (HCC) 05/30/2023   Unstable angina (HCC) 05/29/2023   Pleuritic chest pain 05/27/2023   Type 2 diabetes mellitus with peripheral neuropathy (HCC) 05/27/2023   Gastroesophageal reflux disease without esophagitis 05/27/2023   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   Dyslipidemia 05/27/2023   Hypertensive emergency 11/22/2022   Acute respiratory failure with hypoxia (HCC) 11/22/2022   Elevated troponin 11/22/2022   Chronic heart failure with preserved ejection fraction (HFpEF) (HCC) 08/24/2022   Acute on chronic diastolic congestive heart failure (HCC) 08/23/2022   Class 2 obesity 08/23/2022   Uncontrolled diabetes mellitus with hyperglycemia, with long-term current use of insulin  (HCC) 08/23/2022   Grade I diastolic dysfunction 06/08/2022   LVH (left ventricular hypertrophy) 06/08/2022   Accelerated hypertension 06/08/2022   S/P cervical spinal fusion 08/18/2021   Former heavy tobacco smoker 08/18/2021   Gout 08/18/2021   Other chronic pain 10/26/2020   Systolic murmur 10/26/2020   Tobacco use 10/26/2020   Polyp of ascending colon    Rectal polyp    Class 2 severe obesity with serious comorbidity and body mass index (BMI) of 39.0 to 39.9 in adult [facility]) 04/15/2019   Chronic kidney  disease (CKD) stage G2/A3, mildly decreased glomerular filtration rate (GFR) between 60-89 mL/min/1.73 square meter and albuminuria creatinine ratio greater than 300 mg/g 04/15/2019   Proteinuria 04/15/2019   Hyperlipidemia associated with type 2 diabetes mellitus (HCC) 03/18/2019   Diabetes mellitus (HCC) 03/18/2019   Idiopathic chronic gout of multiple sites without tophus 03/18/2019   Essential hypertension 03/18/2019   Dermatitis 03/18/2019   Cervical myelopathy (HCC) 11/22/2015   Osteoarthritis of spine with radiculopathy, lumbar region 11/22/2015   Degenerative disc disease, cervical 11/22/2015   Osteoarthritis of spine with radiculopathy, cervical region 11/22/2015    PCP: Leavy Mole, PA-C  REFERRING PROVIDER: Corrothers, Duwaine Norris, NP (neurosurgery)  REFERRING DIAG:  s/p cervical spinal fusion, neck pain, cervicalgia  THERAPY DIAG:  Cervicalgia  Radiculopathy, cervical region  Muscle weakness (generalized)  Other muscle spasm  Abnormal posture  Rationale for Evaluation and Treatment: Rehabilitation  ONSET DATE: chronic over many years but underwent C2-6 posterior laminectomy with C2-T2 instrumentation and fusion with Dr. Salina in April 2022   SUBJECTIVE:  PERTINENT HISTORY:  Patient is a 58 y.o. male who presents to outpatient physical therapy with a referral for medical diagnosis  s/p cervical spinal fusion, neck pain, cervicalgia. This patient's chief complaints consist of chronic neck pain and stiffness, UE paresthesia, weakness, and incoordination leading to the following functional deficits: difficulty with usual activities such as using his tractor, being active, personal care, lifting, reading, concentration, work, driving, sleeping, reaching, holding and  manipulating things such as a cup and utinsils with hands, eating, shopping, carrying groceries, and recreation. Relevant past medical history and comorbidities include the following: he has NSTEMI s/p PCI/DES LAD on 06/08/2024; Lumbar degenerative disc disease; Cervical myelopathy (HCC); Osteoarthritis of spine with radiculopathy, lumbar region; Hyperlipidemia associated with type 2 diabetes mellitus (HCC); Diabetes mellitus (HCC); Idiopathic chronic gout of multiple sites without tophus; Essential hypertension; Dermatitis; Class 2 severe obesity with serious comorbidity and body mass index (BMI) of 39.0 to 39.9 in adult [facility]); Chronic kidney disease (CKD) stage G2/A3, mildly decreased glomerular filtration rate (GFR) between 60-89 mL/min/1.73 square meter and albuminuria creatinine ratio greater than 300 mg/g; Proteinuria; Polyp of ascending colon; Rectal polyp; Other chronic pain; Systolic murmur; Tobacco use; S/P cervical spinal fusion; Former heavy tobacco smoker; Gout; Grade I diastolic dysfunction; LVH (left ventricular hypertrophy); Accelerated hypertension; Acute on chronic diastolic congestive heart failure (HCC); Class 2 obesity; Uncontrolled diabetes mellitus with hyperglycemia, with long-term current use of insulin  (HCC); Acute on chronic heart failure with preserved ejection fraction (HFpEF) (HCC); Hypertensive emergency; Acute respiratory failure with hypoxia (HCC); Elevated troponin; Pleuritic chest pain; Type 2 diabetes mellitus with peripheral neuropathy (HCC); Gastroesophageal reflux disease without esophagitis; COPD with asthma (HCC); Dyslipidemia; Unstable angina (HCC); Acute on chronic diastolic heart failure (HCC); Myocardial injury; Type II diabetes mellitus with renal manifestations (HCC); Obesity (BMI 30-39.9); HLD (hyperlipidemia); Chronic diastolic CHF (congestive heart failure) (HCC); Diarrhea; CAD (coronary artery disease); CKD (chronic kidney disease) stage 2, GFR 60-89 ml/min;  Degenerative disc disease, cervical; Osteoarthritis of spine with radiculopathy, cervical region; History of colon polyps; COPD exacerbation (HCC); CHF (congestive heart failure) (HCC); Acute pulmonary edema (HCC); Nicotine  dependence; Class 3 obesity; Acute on chronic diastolic (congestive) heart failure (HCC); Chronic kidney disease, stage 3a (HCC); and Stable angina (HCC) on their problem list. he  has a past medical history of Bulging of cervical intervertebral disc, CAD (coronary artery disease), Cervical myelopathy (HCC), Chronic heart failure with preserved ejection fraction (HFpEF) (HCC), CKD (chronic kidney disease), stage III (HCC), COPD (chronic obstructive pulmonary disease) (HCC), Gout, Hyperlipidemia LDL goal <70, Hypertension, Morbid obesity (HCC), OSA (obstructive sleep apnea), Stress due to family tension (04/15/2019), and Tracheobronchomalacia. he  has a past surgical history that includes Cholecystectomy; Colonoscopy with propofol  (N/A, 03/10/2020); RIGHT/LEFT HEART CATH AND CORONARY ANGIOGRAPHY (N/A, 05/29/2023); CORONARY PRESSURE/FFR STUDY (N/A, 05/29/2023); Cardiac catheterization; Back surgery; Colonoscopy with propofol  (N/A, 09/25/2023); polypectomy (09/25/2023); and RIGHT/LEFT HEART CATH AND CORONARY ANGIOGRAPHY (Bilateral, 10/30/2023). Patient denies hx of cancer, stroke, seizures, unexplained weight loss, unexplained changes in bowel or bladder problems, and osteoporosis  Exercise history: no weights at home.   SUBJECTIVE STATEMENT: Patient states he had a heart attack 2 weeks ago Monday night (06/08/24). He was hospitalized and had a stent placed. He was cleared to return to PT by his cardiologist yesterday. He has started on a blood thinner and the cardiologist stopped something he was taking for chest pain yesterday. He states he is waiting on insurance to find out about cardiac rehab. He has an appointment with a doctor who specializes  in heart failure. He is cleared for cardiac  rehab per his cardiologist. He states he took his BP medication this morning. He states his BP was high this morning when he took it at home. He states he has been real sleepy since the heart attack. He states his joints have also been hurting.   NSTEMI s/p PCI/DES LAD on 06/08/2024  Top three problems list: hand grip/holding things, tightness in B UT (driving, walking, sitting, UBE,   PAIN: Are you having pain? NPRS: Current: 6-7/10 in left neck from suboccipital region to tip of shoulder (was 12/10 earlier today before taking a tylenol ).   From initial eval 05/19/24:  Best: 2/10, Worst: 10/10. Pain location: back of neck down to just past the scapulae, bilateral upper traps to tops of shoulders, sore over the clavicles and throat to touch. Numbness and tingling bilateral hands (R > L). Numbness in the R hand 2nd and 3rd digits all the time, but can be all the fingers of the R hand. He gets shooting down the left arm to the middle finger and increased numbness spreading from that finger.  Pain description: I can feel where they cut me, muscle spasm (vibrating), choking, cramp Aggravating factors: bending, lifting, raking, driving for a long distance Relieving factors: stay active, getting out to do things he usually does   PRECAUTIONS: Other: lifting heavy stuff and cannot remember other stuff, does not want him to over do stuff, cut back on some of the stuff he is doing at the house. He mentions bed rest but also that the doctor told him to come to physical therapy. Repeatedly hospitalized for complications with his heart (most recently afib)   PATIENT GOALS: like get everything loosened up like it was when he left PT last time, and try to get the pain and tightness to quit.   NEXT MD VISIT: MRI on 05/23/2024 followed by a phone call after the results are in.  OBJECTIVE  Vitals:   06/25/24 1042 06/25/24 1102  BP: (!) 189/85 (!) 188/80  Pulse: 64 61  SpO2: 97% 95%  First reading Dinamap,  left arm, normal cuff (barely within size range) after calm sitting; 2nd reading manual large cuff, left arm, after sitting quietly for > 5 minutes, arm propped on table at heart height.   Weight: 227.6 lbs (shoes off, no jacket)   NEUROLOGIC ULNT (06/02/24) Median: positive on left, worse on right with pain in mid back sensitive to hand position.  Ulnar: negative on left, positive on right Radial: positive on left but unclear how far into ROM, more positive on R with wrist position affecting pain at UT even in 30 degrees shoulder flexion.   TREATMENT                                                                                                                            Self-Care/Home Management Training: to educate patient in self care including ADL training, meal preparation,  compensatory training, safety procedures/instructions, use of assistive technology devices or adaptive equipment.  Vitals check x2 and education about importance of blood pressure control and contraindication to PT with systolic over 180 mmHg today.   Education on instructions from PA Miriam Shams from the cardiology office advised him to do.     PATIENT EDUCATION:  Education details: Exercise purpose/form. Self management techniques. Education on diagnosis, prognosis, POC, anatomy and physiology of current condition. Education on HEP  Person educated: Patient Education method: Explanation, Demonstration, Tactile cues, and Verbal cues Education comprehension: verbalized understanding, returned demonstration, and needs further education  HOME EXERCISE PROGRAM: Access Code: [email or web address] URL: [email or web address] Date: 05/21/2024 Prepared by: Camie Cleverly  Exercises - Sidelying Shoulder External Rotation  - 1-2 x daily - 2 sets - 10 reps - 5 seconds hold - Sidelying Open Book  - 1-2 x daily - 2 sets - 10 reps - 1 or more breaths hold - Lat Pull with band/cable  - 1-2 x daily - 2 sets - 10 reps - 5  seconds hold  ASSESSMENT:  CLINICAL IMPRESSION: Patient returns to PT after NSTEMI s/p PCI/DES LAD on 06/08/2024 and cleared to return to PT on 06/23/24 by cardiology. Vitals measured at start of visit and pt's systolic BP elevated above 180 mmHg which is a contraindication for PT at this time. Patient educated on importance of BP control and cardiology office contacted for advice for how patient is to proceed. Will hold PT besides education until BP is lower. Cardiologist office contacted via instant chat message and Kenzie Campbell,NP advised pt to hold off on PT today and have him resume next week after the extra bisoprolol  they started yesterday has a chance to work. She also advised him to go home, rest, and stop smoking, and to take an extra dose of Torsemide  today and tomorrow to help get some extra fluid off him. Patient was advised of her advice by the PT.  Patient to return to PT 1 week from today (monday visit next week cancelled to allow medication time to work and get his BP under control).  Patient remained asymptomatic (except for usual neck pain) while in the clinic. He waited to leave until advice was obtained from cardiology. Patient would benefit from continued management of limiting condition by skilled physical therapist to address remaining impairments and functional limitations to work towards stated goals and return to PLOF or maximal functional independence.   From initial PT evaluation on 05/19/24:  Patient is a 58 y.o. male referred to outpatient physical therapy with a medical diagnosis of  s/p cervical spinal fusion, neck pain, cervicalgia who presents with signs and symptoms consistent with chronic neck pain, postural changes, bilateral radiculopathy with load sensitivity and likely nerve tension sensitivity. Traction alleviation test positive for load sensitivity. Patient has a lot of stiffness in muscles and joints in the regions surrounding his C2-T2 fusion that appear to be  leading to a lot of his discomfort and functional difficulty. Patient presents with significant pain, joint stiffness, posture, nerve stretch sensitivity, load sensitivity, ROM, muscle performance (strength/power/endurance), paresthesia, motor control, knowledge, and activity tolerance impairments that are limiting ability to complete difficulty with usual activities such as using his tractor, being active, personal care, lifting, reading, concentration, work, driving, sleeping, reaching, holding and manipulating things such as a cup and utinsils with hands, eating, shopping, carrying groceries, and recreation without difficulty. Patient will benefit from skilled physical therapy intervention to address current body structure impairments and activity limitations to  improve function and work towards goals set in current POC in order to return to prior level of function or maximal functional improvement.    OBJECTIVE IMPAIRMENTS: cardiopulmonary status limiting activity, decreased activity tolerance, decreased coordination, decreased endurance, decreased knowledge of condition, decreased ROM, decreased strength, hypomobility, increased edema, increased fascial restrictions, increased muscle spasms, impaired flexibility, impaired sensation, impaired UE functional use, improper body mechanics, postural dysfunction, obesity, and pain.   ACTIVITY LIMITATIONS: carrying, lifting, bending, sitting, bed mobility, reach over head, hygiene/grooming, and caring for others and   PARTICIPATION LIMITATIONS: meal prep, cleaning, laundry, driving, shopping, community activity, occupation, yard work, and  difficulty with usual activities such as using his tractor, being active, personal care, lifting, reading, concentration, work, driving, sleeping, reaching, holding and manipulating things such as a cup and utinsils with hands, eating, shopping, carrying groceries, and recreation  PERSONAL FACTORS: Behavior pattern, Fitness,  Past/current experiences, Time since onset of injury/illness/exacerbation, and 3+ comorbidities:  Cervical myelopathy (HCC); Osteoarthritis of spine with radiculopathy, lumbar region; Hyperlipidemia associated with type 2 diabetes mellitus (HCC); Diabetes mellitus (HCC); Idiopathic chronic gout of multiple sites without tophus; Essential hypertension; Dermatitis; Class 2 severe obesity with serious comorbidity and body mass index (BMI) of 39.0 to 39.9 in adult [facility]); Chronic kidney disease (CKD) stage G2/A3, mildly decreased glomerular filtration rate (GFR) between 60-89 mL/min/1.73 square meter and albuminuria creatinine ratio greater than 300 mg/g; Proteinuria; Polyp of ascending colon; Rectal polyp; Other chronic pain; Systolic murmur; Tobacco use; S/P cervical spinal fusion; Former heavy tobacco smoker; Gout; Grade I diastolic dysfunction; LVH (left ventricular hypertrophy); Accelerated hypertension; Acute on chronic diastolic congestive heart failure (HCC); Class 2 obesity; Uncontrolled diabetes mellitus with hyperglycemia, with long-term current use of insulin  (HCC); Acute on chronic heart failure with preserved ejection fraction (HFpEF) (HCC); Hypertensive emergency; Acute respiratory failure with hypoxia (HCC); Elevated troponin; Pleuritic chest pain; Type 2 diabetes mellitus with peripheral neuropathy (HCC); Gastroesophageal reflux disease without esophagitis; COPD with asthma (HCC); Dyslipidemia; Unstable angina (HCC); Acute on chronic diastolic heart failure (HCC); Myocardial injury; Type II diabetes mellitus with renal manifestations (HCC); Obesity (BMI 30-39.9); HLD (hyperlipidemia); Chronic diastolic CHF (congestive heart failure) (HCC); Diarrhea; CAD (coronary artery disease); CKD (chronic kidney disease) stage 2, GFR 60-89 ml/min; Degenerative disc disease, cervical; Osteoarthritis of spine with radiculopathy, cervical region; History of colon polyps; COPD exacerbation (HCC); CHF (congestive heart  failure) (HCC); Acute pulmonary edema (HCC); Nicotine  dependence; Class 3 obesity; Acute on chronic diastolic (congestive) heart failure (HCC); Chronic kidney disease, stage 3a (HCC); and Stable angina (HCC) on their problem list. he  has a past medical history of Bulging of cervical intervertebral disc, CAD (coronary artery disease), Cervical myelopathy (HCC), Chronic heart failure with preserved ejection fraction (HFpEF) (HCC), CKD (chronic kidney disease), stage III (HCC), COPD (chronic obstructive pulmonary disease) (HCC), Gout, Hyperlipidemia LDL goal <70, Hypertension, Morbid obesity (HCC), OSA (obstructive sleep apnea), Stress due to family tension (04/15/2019), and Tracheobronchomalacia. he  has a past surgical history that includes Cholecystectomy; Colonoscopy with propofol  (N/A, 03/10/2020); RIGHT/LEFT HEART CATH AND CORONARY ANGIOGRAPHY (N/A, 05/29/2023); CORONARY PRESSURE/FFR STUDY (N/A, 05/29/2023); Cardiac catheterization; Back surgery; Colonoscopy with propofol  (N/A, 09/25/2023); polypectomy (09/25/2023); and RIGHT/LEFT HEART CATH AND CORONARY ANGIOGRAPHY (Bilateral, 10/30/2023) are also affecting patient's functional outcome.   REHAB POTENTIAL: Fair due to severity and complexity of condition and past experiences. Good rehab potential to improve to where he was at last PT discharge.   CLINICAL DECISION MAKING: Evolving/moderate complexity  EVALUATION COMPLEXITY: Moderate   GOALS: Goals reviewed  with patient? No  SHORT TERM GOALS: Target date: 06/02/2024  Patient will be independent with initial home exercise program for self-management of symptoms. Baseline: Initial HEP provided at IE (05/19/24); Goal status: MET   LONG TERM GOALS: Target date: 08/11/2024  Patient will be independent with a long-term home exercise program for self-management of symptoms.  Baseline: Initial HEP provided at IE (05/19/24); Goal status: In progress  2.  Patient will demonstrate improved Neck  Disability Index (NDI) to equal or less than 10% to demonstrate improvement in overall condition and self-reported functional ability.  Baseline: 58% (05/19/24); Goal status: In progress  3.  Patient will demonstrate increased UE myotomes by equal or greater than 1/2 MMT or up to 4+/5 to improve his ability to use his B UE for riding the tractor and completing household activities.  Baseline: as low as 3/5  (05/19/24); Goal status: In Progress  4.  Patient will demonstrate improved cervical spine rotation to 40 degrees bilaterally to maximize his use of the AA ROM and view his surroundings more easily.  Baseline: R/L 15/34 (05/19/24); Goal status: In progress  5.  Patient will demonstrate improvement in Patient Specific Functional Scale (PSFS) of equal or greater than 8/10 points to reflect clinically significant improvement in patient's most valued functional activities. Baseline: to be measured at visit 2 as appropriate (05/19/24); 3.3/10 (05/21/2024);  Goal status: In progress    PLAN:  PT FREQUENCY: 2x/week  PT DURATION: 8-12 weeks  PLANNED INTERVENTIONS: 97164- PT Re-evaluation, 97750- Physical Performance Testing, 97110-Therapeutic exercises, 97530- Therapeutic activity, 97112- Neuromuscular re-education, 97535- Self Care, 02859- Manual therapy, [phone]- Aquatic Therapy, [phone]- Electrical stimulation (unattended), [phone] (1-2 muscles), 20561 (3+ muscles)- Dry Needling, Patient/Family education, Joint mobilization, Spinal mobilization, Cryotherapy, and Moist heat  PLAN FOR NEXT SESSION: update HEP as appropriate, educate on mechanical stressors and go through day to help with education on alleviation of these for the short term, exercise to improve intrascapular activation and strength for more balanced force couple and decrease over-reliance on UT, interventions to improve thoracic and AA/OA mobility. education   Camie SAUNDERS. Juli, PT, DPT, Cert. MDT 06/25/24, 1:31 PM  [facility]  Physical & Sports Rehab [address] [place], [place] [address] P: [phone] I F: [phone]

## 2024-06-26 ENCOUNTER — Other Ambulatory Visit: Payer: Self-pay

## 2024-06-29 ENCOUNTER — Other Ambulatory Visit: Payer: Self-pay | Admitting: Family Medicine

## 2024-06-29 ENCOUNTER — Other Ambulatory Visit: Payer: Self-pay | Admitting: Internal Medicine

## 2024-06-29 ENCOUNTER — Other Ambulatory Visit: Payer: Self-pay

## 2024-06-29 ENCOUNTER — Ambulatory Visit: Admitting: Physical Therapy

## 2024-06-30 ENCOUNTER — Other Ambulatory Visit (HOSPITAL_COMMUNITY): Payer: Self-pay

## 2024-06-30 ENCOUNTER — Other Ambulatory Visit: Payer: Self-pay

## 2024-06-30 MED ORDER — AMLODIPINE BESYLATE 10 MG PO TABS
10.0000 mg | ORAL_TABLET | Freq: Every day | ORAL | 3 refills | Status: AC
  Filled 2024-07-09 – 2024-07-14 (×2): qty 30, 30d supply, fill #0
  Filled 2024-08-25: qty 30, 30d supply, fill #1
  Filled 2024-09-25: qty 30, 30d supply, fill #2
  Filled 2024-11-09 – 2024-11-13 (×2): qty 30, 30d supply, fill #3

## 2024-07-01 NOTE — Telephone Encounter (Signed)
 Requested medication (s) are due for refill today: yes  Requested medication (s) are on the active medication list: yes  Last refill:  06/04/24  Future visit scheduled: yes  Notes to clinic:  Unable to refill per protocol, last refill by another provider.      Requested Prescriptions  Pending Prescriptions Disp Refills   sucralfate  (CARAFATE ) 1 g tablet 120 tablet 0    Sig: Take 1 tablet (1 g total) by mouth 4 (four) times daily -  with meals and at bedtime.     Gastroenterology: Antiacids Passed - 07/01/2024  8:21 AM      Passed - Valid encounter within last 12 months    Recent Outpatient Visits           1 month ago Uncontrolled diabetes mellitus with hyperglycemia, with long-term current use of insulin  [facility])   [facility] Leavy Mole, PA-C   1 month ago Allergic rash present on examination   [facility] [name], DO   4 months ago Hospital discharge follow-up   [facility] Leavy Mole, PA-C   4 months ago Exacerbation of persistent asthma, unspecified asthma severity   [facility] Leavy Mole, PA-C   5 months ago Encounter for examination following treatment at hospital   [facility] Leavy Mole, [place]       Future Appointments             In 2 weeks Dartha Ernst, MD [facility] Endocrinology   In 3 weeks Dunn, Bernardino HERO, PA-C  [facility]   In 1 month Leavy Mole, PA-C [facility] [place]

## 2024-07-02 ENCOUNTER — Ambulatory Visit: Admitting: Physical Therapy

## 2024-07-02 ENCOUNTER — Other Ambulatory Visit (HOSPITAL_COMMUNITY): Payer: Self-pay

## 2024-07-07 ENCOUNTER — Emergency Department
Admission: EM | Admit: 2024-07-07 | Discharge: 2024-07-07 | Disposition: A | Discharge status: Home / Self Care | Attending: Emergency Medicine | Admitting: Emergency Medicine

## 2024-07-07 ENCOUNTER — Ambulatory Visit: Payer: Self-pay | Admitting: *Deleted

## 2024-07-07 ENCOUNTER — Other Ambulatory Visit: Payer: Self-pay

## 2024-07-07 ENCOUNTER — Emergency Department

## 2024-07-07 ENCOUNTER — Ambulatory Visit: Admitting: Physical Therapy

## 2024-07-07 ENCOUNTER — Telehealth: Payer: Self-pay | Admitting: Physical Therapy

## 2024-07-07 DIAGNOSIS — M791 Myalgia, unspecified site: Secondary | ICD-10-CM | POA: Diagnosis not present

## 2024-07-07 DIAGNOSIS — R52 Pain, unspecified: Secondary | ICD-10-CM

## 2024-07-07 DIAGNOSIS — I509 Heart failure, unspecified: Secondary | ICD-10-CM | POA: Diagnosis not present

## 2024-07-07 DIAGNOSIS — R7309 Other abnormal glucose: Secondary | ICD-10-CM | POA: Insufficient documentation

## 2024-07-07 DIAGNOSIS — K5732 Diverticulitis of large intestine without perforation or abscess without bleeding: Secondary | ICD-10-CM | POA: Insufficient documentation

## 2024-07-07 DIAGNOSIS — R160 Hepatomegaly, not elsewhere classified: Secondary | ICD-10-CM | POA: Insufficient documentation

## 2024-07-07 DIAGNOSIS — N189 Chronic kidney disease, unspecified: Secondary | ICD-10-CM | POA: Insufficient documentation

## 2024-07-07 DIAGNOSIS — K5792 Diverticulitis of intestine, part unspecified, without perforation or abscess without bleeding: Secondary | ICD-10-CM | POA: Diagnosis not present

## 2024-07-07 DIAGNOSIS — J449 Chronic obstructive pulmonary disease, unspecified: Secondary | ICD-10-CM | POA: Insufficient documentation

## 2024-07-07 DIAGNOSIS — I2510 Atherosclerotic heart disease of native coronary artery without angina pectoris: Secondary | ICD-10-CM | POA: Diagnosis not present

## 2024-07-07 DIAGNOSIS — D72829 Elevated white blood cell count, unspecified: Secondary | ICD-10-CM | POA: Diagnosis not present

## 2024-07-07 DIAGNOSIS — I130 Hypertensive heart and chronic kidney disease with heart failure and stage 1 through stage 4 chronic kidney disease, or unspecified chronic kidney disease: Secondary | ICD-10-CM | POA: Insufficient documentation

## 2024-07-07 DIAGNOSIS — R1030 Lower abdominal pain, unspecified: Secondary | ICD-10-CM | POA: Diagnosis present

## 2024-07-07 LAB — CBC
HCT: 46.6 % (ref 39.0–52.0)
Hemoglobin: 16.1 g/dL (ref 13.0–17.0)
MCH: 29.3 pg (ref 26.0–34.0)
MCHC: 34.5 g/dL (ref 30.0–36.0)
MCV: 84.7 fL (ref 80.0–100.0)
Platelets: 226 K/uL (ref 150–400)
RBC: 5.5 MIL/uL (ref 4.22–5.81)
RDW: 13.1 % (ref 11.5–15.5)
WBC: 12 K/uL — ABNORMAL HIGH (ref 4.0–10.5)
nRBC: 0 % (ref 0.0–0.2)

## 2024-07-07 LAB — COMPREHENSIVE METABOLIC PANEL WITH GFR
ALT: 18 U/L (ref 0–44)
AST: 19 U/L (ref 15–41)
Albumin: 4.2 g/dL (ref 3.5–5.0)
Alkaline Phosphatase: 92 U/L (ref 38–126)
Anion gap: 10 (ref 5–15)
BUN: 20 mg/dL (ref 6–20)
CO2: 27 mmol/L (ref 22–32)
Calcium: 8.9 mg/dL (ref 8.9–10.3)
Chloride: 100 mmol/L (ref 98–111)
Creatinine, Ser: 1.69 mg/dL — ABNORMAL HIGH (ref 0.61–1.24)
GFR, Estimated: 47 mL/min — ABNORMAL LOW (ref >60)
Glucose, Bld: 244 mg/dL — ABNORMAL HIGH (ref 70–99)
Potassium: 3.4 mmol/L — ABNORMAL LOW (ref 3.5–5.1)
Sodium: 137 mmol/L (ref 135–145)
Total Bilirubin: 1.1 mg/dL (ref 0.0–1.2)
Total Protein: 7.9 g/dL (ref 6.5–8.1)

## 2024-07-07 LAB — RESP PANEL BY RT-PCR (RSV, FLU A&B, COVID)  RVPGX2
Influenza A by PCR: NEGATIVE
Influenza B by PCR: NEGATIVE
Resp Syncytial Virus by PCR: NEGATIVE
SARS Coronavirus 2 by RT PCR: NEGATIVE

## 2024-07-07 LAB — LIPASE, BLOOD: Lipase: 60 U/L — ABNORMAL HIGH (ref 11–51)

## 2024-07-07 MED ORDER — AMOXICILLIN-POT CLAVULANATE 875-125 MG PO TABS
1.0000 | ORAL_TABLET | Freq: Two times a day (BID) | ORAL | 0 refills | Status: DC
  Filled 2024-07-07: qty 14, 7d supply, fill #0

## 2024-07-07 MED ORDER — AMOXICILLIN-POT CLAVULANATE 875-125 MG PO TABS
1.0000 | ORAL_TABLET | Freq: Two times a day (BID) | ORAL | 0 refills | Status: DC
  Filled 2024-07-07: qty 20, 10d supply, fill #0

## 2024-07-07 MED ORDER — CYCLOBENZAPRINE HCL 10 MG PO TABS
5.0000 mg | ORAL_TABLET | Freq: Once | ORAL | Status: AC
  Administered 2024-07-07: 5 mg via ORAL
  Filled 2024-07-07: qty 1

## 2024-07-07 MED ORDER — AMOXICILLIN-POT CLAVULANATE 875-125 MG PO TABS
1.0000 | ORAL_TABLET | Freq: Once | ORAL | Status: AC
Start: 2024-07-07 — End: 2024-07-07
  Administered 2024-07-07: 1 via ORAL
  Filled 2024-07-07: qty 1

## 2024-07-07 MED ORDER — IOHEXOL 350 MG/ML SOLN
100.0000 mL | Freq: Once | INTRAVENOUS | Status: AC | PRN
  Administered 2024-07-07: 100 mL via INTRAVENOUS

## 2024-07-07 NOTE — ED Provider Notes (Signed)
 [facility] Emergency Department Provider Note    Event Date/Time   First MD Initiated Contact with Patient 07/07/24 1735     (approximate)  History   Generalized Body Aches  HPI  Cory Rivas is a 58 y.o. male with a past medical history of diabetes, HLD, HTN, CHF, COPD, CAD, OSA, morbid obesity and CKD presents to the ED for evaluation of multiple complaints.  Patient reports generalized joint aches worse in right shoulder, fatigue, lower abdominal pain and back pain x 3 days. States it feels that he has the flu. States he feels a lot better during my evaluation since onset. No cough, fever, shortness breath, or chest pain. No urinary symptoms. No nausea or vomiting. Endorses taking flonase  and symptoms improved after mowing the lawn.  Patient is unsure if back pain is contributed to riding his lawn mower over the weekend.  Patient reports he came today more so for his lower abdominal pain. 9/10 pain.  Worse with positional change.  Advised by his primary care provider to seek ED evaluation.  He also notes also notes swelling to his right upper quadrant.    Physical Exam   Triage Vital Signs: ED Triage Vitals  Encounter Vitals Group     BP 07/07/24 1501 135/66     Girls Systolic BP Percentile --      Girls Diastolic BP Percentile --      Boys Systolic BP Percentile --      Boys Diastolic BP Percentile --      Pulse Rate 07/07/24 1501 63     Resp 07/07/24 1501 17     Temp 07/07/24 1502 98.1 F (36.7 C)     Temp Source 07/07/24 1502 Oral     SpO2 07/07/24 1501 98 %     Weight 07/07/24 1502 226 lb 12.8 oz (102.9 kg)     Height 07/07/24 1502 5' 7 (1.702 m)     Head Circumference --      Peak Flow --      Pain Score 07/07/24 1502 8     Pain Loc --      Pain Education --      Exclude from Growth Chart --     Most recent vital signs: Vitals:   07/07/24 1843 07/07/24 2105  BP: 128/78 124/78  Pulse: 60 67  Resp: 18 18  Temp: 98.7 F (37.1  C) 98.7 F (37.1 C)  SpO2: 98% 98%   General: Well appearing and comfortable. Alert and oriented. INAD.  Skin:  Warm, dry and intact. No rashes or lesions noted.     Head:  NCAT.  Eyes:  PERRLA. EOMI.  CV:  Good peripheral perfusion. RRR. RESP:  Normal effort. LCTAB.  ABD:  No distention. Soft, tender to lower abdomen.  Noted hepatomegaly to palpation.  ED Results / Procedures / Treatments   Labs (all labs ordered are listed, but only abnormal results are displayed) Labs Reviewed  CBC - Abnormal; Notable for the following components:      Result Value   WBC 12.0 (*)    All other components within normal limits  COMPREHENSIVE METABOLIC PANEL WITH GFR - Abnormal; Notable for the following components:   Potassium 3.4 (*)    Glucose, Bld 244 (*)    Creatinine, Ser 1.69 (*)    GFR, Estimated 47 (*)    All other components within normal limits  LIPASE, BLOOD - Abnormal; Notable for the following components:  Lipase 60 (*)    All other components within normal limits  RESP PANEL BY RT-PCR (RSV, FLU A&B, COVID)  RVPGX2   RADIOLOGY  I personally viewed and evaluated these images as part of my medical decision making, as well as reviewing the written report by the radiologist.  CT ABDOMEN PELVIS W CONTRAST Result Date: 07/07/2024 CLINICAL DATA:  Diverticulitis, complication suspected EXAM: CT ABDOMEN AND PELVIS WITH CONTRAST TECHNIQUE: Multidetector CT imaging of the abdomen and pelvis was performed using the standard protocol following bolus administration of intravenous contrast. RADIATION DOSE REDUCTION: This exam was performed according to the departmental dose-optimization program which includes automated exposure control, adjustment of the mA and/or kV according to patient size and/or use of iterative reconstruction technique. CONTRAST:  OMNIPAQUE  IOHEXOL  350 MG/ML SOLN COMPARISON:  CT abdomen pelvis 06/04/2024 FINDINGS: Lower chest: Aortic valve leaflet calcification.  Hepatobiliary: The liver is enlarged measuring up to 21 cm. The hepatic parenchyma is diffusely hypodense compared to the splenic parenchyma consistent with fatty infiltration. No focal liver abnormality. Status post cholecystectomy. No biliary dilatation. Pancreas: No focal lesion. Normal pancreatic contour. No surrounding inflammatory changes. No main pancreatic ductal dilatation. Spleen: Normal in size without focal abnormality. Adrenals/Urinary Tract: No adrenal nodule bilaterally. Bilateral kidneys enhance symmetrically. No hydronephrosis. No hydroureter. The urinary bladder is unremarkable. Stomach/Bowel: Stomach is within normal limits. No evidence of small bowel wall thickening or dilatation. Colonic diverticulosis. Bowel wall thickening with pericolonic fat stranding along the focal mid to distal sigmoid diverticula (2:70). Appendix appears normal. Vascular/Lymphatic: No abdominal aorta or iliac aneurysm. Mild to moderate atherosclerotic plaque of the aorta and its branches. No abdominal, pelvic, or inguinal lymphadenopathy. Reproductive: Prostate is unremarkable. Other: No intraperitoneal free fluid. No intraperitoneal free gas. No organized fluid collection. Musculoskeletal: No abdominal wall hernia or abnormality. No suspicious lytic or blastic osseous lesions. No acute displaced fracture. Multilevel degenerative changes of the spine. IMPRESSION: Colonic diverticulosis with acute uncomplicated mid to distal sigmoid diverticulitis. 1. Hepatomegaly and hepatic steatosis. Recommend colonoscopy status post treatment and status post complete resolution of inflammatory changes to exclude an underlying lesion. 2. Status post cholecystectomy. 3.  Aortic Atherosclerosis (ICD10-I70.0). Electronically Signed   By: Morgane  Naveau M.D.   On: 07/07/2024 19:59    PROCEDURES:  Critical Care performed: No  Procedures   MEDICATIONS ORDERED IN ED: Medications  cyclobenzaprine  (FLEXERIL ) tablet 5 mg (5 mg Oral  Given 07/07/24 1818)  iohexol  (OMNIPAQUE ) 350 MG/ML injection 100 mL (100 mLs Intravenous Contrast Given 07/07/24 1942)  amoxicillin -clavulanate (AUGMENTIN ) 875-125 MG per tablet 1 tablet (1 tablet Oral Given 07/07/24 2020)     IMPRESSION / MDM / ASSESSMENT AND PLAN / ED COURSE  I reviewed the triage vital signs and the nursing notes.                               58 y.o. male presents to the emergency department for evaluation and treatment of generalized bodyaches and lower abdominal pain with abdominal swelling. See HPI for further details.   Differential diagnosis includes, but is not limited to, acute appendicitis, diverticulitis, small bowel obstruction or ileus, colitis, gastroenteritis, hernia, ulcer, viral URI    Patient's presentation is most consistent with acute complicated illness / injury requiring diagnostic workup.  Patient is alert and oriented.  He is hemodynamic stable.  Physical exam findings are stated above.  Lab work overall reassuring.  CBC -mild leukocytosis 12.0.  BMP shows  elevated glucose of 244.  Elevated creatinine of 169.  And low GFR 47.  Lipase elevated 60.  Respiratory panel reassuring.  Plan to administer muscle relaxant for generalized body ache and obtain CT abdomen pelvis  CT abdomen pelvis is pertinent for hepatomegaly and colonic diverticulitis with acute uncomplicated mid to distal sigmoid diverticulitis.  Will treat with Augmentin .  First dose given in ED.  Advised patient close follow-up with GI for colonoscopy following antibiotic course.  Patient verbalized understanding is in agreement with care plan.  ED return precaution discussed.  Patient stable at discharge.   FINAL CLINICAL IMPRESSION(S) / ED DIAGNOSES   Final diagnoses:  Diverticulitis  Hepatomegaly  Body aches   Rx / DC Orders   ED Discharge Orders          Ordered    amoxicillin -clavulanate (AUGMENTIN ) 875-125 MG tablet  2 times daily,   Status:  Discontinued        07/07/24 2026     amoxicillin -clavulanate (AUGMENTIN ) 875-125 MG tablet  2 times daily        07/07/24 2030             Note:  This document was prepared using Dragon voice recognition software and may include unintentional dictation errors.    Margrette, Jozlin Bently A, PA-C 07/07/24 2245    Dorothyann Drivers, MD 07/07/24 2257

## 2024-07-07 NOTE — Telephone Encounter (Signed)
 FYI Only or Action Required?: FYI only for provider.  Patient was last seen in primary care on 05/29/2024 by Leavy Mole, PA-C.  Called Nurse Triage reporting Shoulder Pain (Abdominal pain, bach/shoulder pain, fatigue).  Symptoms began several days ago.  Interventions attempted: Rest, hydration, or home remedies.  Symptoms are: gradually worsening.  Triage Disposition: Go to ED Now (Notify PCP)  Patient/caregiver understands and will follow disposition?: Yes   Reason for Disposition  [1] Age > 40 AND [2] no obvious cause AND [3] pain even when not moving the arm  (Exception: Pain is clearly made worse by moving arm or bending neck.)  Answer Assessment - Initial Assessment Questions 1. ONSET: When did the pain start?     Over the weekend 2. LOCATION: Where is the pain located?     R shoulder pain, mid back pain, lower abdominal pain- all across 3. PAIN: How bad is the pain? (Scale 1-10; or mild, moderate, severe)     Shoulder pain- 10/10- hard to move arm 4. WORK OR EXERCISE: Has there been any recent work or exercise that involved this part of the body?     no 5. CAUSE: What do you think is causing the shoulder pain?     unsure 6. OTHER SYMPTOMS: Do you have any other symptoms? (e.g., neck pain, swelling, rash, fever, numbness, weakness)     Numbness in hand  Protocols used: Shoulder Pain-A-AH    Copied from CRM #[id number]. Topic: Clinical - Red Word Triage >> Jul 07, 2024  2:17 PM Turkey B wrote: [facility] that prompted transfer to Nurse Triage: Patient has severe pain in stomach, right shoulder and no energy

## 2024-07-07 NOTE — ED Triage Notes (Signed)
 Pt sts that he has bee having generalized body pain for the last three days.

## 2024-07-07 NOTE — Discharge Instructions (Addendum)
 You were evaluated in the ED for abdominal pain.  Your respiratory panel which includes COVID, influenza and RSV is negative.  Your CT abdominal and pelvis shows an enlarged liver.  We recommend you follow-up with your GI specialist, Dr. Jinny for further evaluation with a colonoscopy procedure.  Please call and schedule appointment tomorrow.  Your CT scan also revealed diverticulitis in which we will treat with antibiotics.  Please complete dose of antibiotics even if symptoms improve.

## 2024-07-07 NOTE — ED Notes (Signed)
 Returned from CT at this time.

## 2024-07-07 NOTE — Telephone Encounter (Signed)
 LVM notifying patient of missed PT visit scheduled at 10:30am today.  Let patient know that with any no-show I am required to review our cancellation policy that after 2 no-shows we remove future visits from the schedule, they are responsible to calling in to reschedule, and they will only be able to schedule one appointment at a time and/or we may remove a patient from the schedule.   Camie SAUNDERS. Juli, PT, DPT 07/07/24, 12:16 PM  [facility] Physical & Sports Rehab [address] [place], [place] [address] P: [phone] I F: [phone]

## 2024-07-08 ENCOUNTER — Other Ambulatory Visit: Payer: Self-pay

## 2024-07-09 ENCOUNTER — Other Ambulatory Visit (HOSPITAL_COMMUNITY): Payer: Self-pay

## 2024-07-09 ENCOUNTER — Other Ambulatory Visit: Payer: Self-pay

## 2024-07-09 ENCOUNTER — Ambulatory Visit: Admitting: Physical Therapy

## 2024-07-09 MED ORDER — PRASUGREL HCL 10 MG PO TABS
10.0000 mg | ORAL_TABLET | Freq: Every day | ORAL | 3 refills | Status: AC
  Filled 2024-07-09: qty 90, 90d supply, fill #0
  Filled 2024-10-19: qty 30, 30d supply, fill #1
  Filled 2024-11-09 – 2024-11-13 (×2): qty 30, 30d supply, fill #2

## 2024-07-09 MED FILL — Duloxetine HCl Enteric Coated Pellets Cap 30 MG (Base Eq): ORAL | 30 days supply | Qty: 90 | Fill #1 | Status: CN

## 2024-07-09 MED FILL — Allopurinol Tab 100 MG: ORAL | 30 days supply | Qty: 30 | Fill #1 | Status: CN

## 2024-07-09 MED FILL — Rosuvastatin Calcium Tab 40 MG: ORAL | 30 days supply | Qty: 30 | Fill #1 | Status: CN

## 2024-07-09 MED FILL — Continuous Glucose System Sensor: 28 days supply | Qty: 2 | Fill #1 | Status: CN

## 2024-07-09 MED FILL — Ezetimibe Tab 10 MG: ORAL | 30 days supply | Qty: 30 | Fill #1 | Status: CN

## 2024-07-10 ENCOUNTER — Other Ambulatory Visit: Payer: Self-pay

## 2024-07-10 ENCOUNTER — Other Ambulatory Visit: Payer: Self-pay | Admitting: Family Medicine

## 2024-07-13 ENCOUNTER — Encounter: Payer: Self-pay | Admitting: Student in an Organized Health Care Education/Training Program

## 2024-07-13 ENCOUNTER — Ambulatory Visit: Admitting: Student in an Organized Health Care Education/Training Program

## 2024-07-13 ENCOUNTER — Other Ambulatory Visit: Payer: Self-pay

## 2024-07-13 VITALS — BP 130/70 | HR 54 | Temp 97.6°F | Ht 67.0 in | Wt 223.8 lb

## 2024-07-13 DIAGNOSIS — F17210 Nicotine dependence, cigarettes, uncomplicated: Secondary | ICD-10-CM | POA: Diagnosis not present

## 2024-07-13 DIAGNOSIS — R0602 Shortness of breath: Secondary | ICD-10-CM

## 2024-07-13 DIAGNOSIS — I2510 Atherosclerotic heart disease of native coronary artery without angina pectoris: Secondary | ICD-10-CM

## 2024-07-13 DIAGNOSIS — J398 Other specified diseases of upper respiratory tract: Secondary | ICD-10-CM

## 2024-07-13 DIAGNOSIS — J4530 Mild persistent asthma, uncomplicated: Secondary | ICD-10-CM

## 2024-07-13 DIAGNOSIS — J449 Chronic obstructive pulmonary disease, unspecified: Secondary | ICD-10-CM

## 2024-07-13 DIAGNOSIS — F17200 Nicotine dependence, unspecified, uncomplicated: Secondary | ICD-10-CM

## 2024-07-13 DIAGNOSIS — I272 Pulmonary hypertension, unspecified: Secondary | ICD-10-CM

## 2024-07-13 DIAGNOSIS — G4733 Obstructive sleep apnea (adult) (pediatric): Secondary | ICD-10-CM

## 2024-07-13 MED ORDER — VENTOLIN HFA 108 (90 BASE) MCG/ACT IN AERS
2.0000 | INHALATION_SPRAY | Freq: Four times a day (QID) | RESPIRATORY_TRACT | 2 refills | Status: DC | PRN
  Filled 2024-07-13 (×2): qty 18, 25d supply, fill #0

## 2024-07-13 MED ORDER — NICOTINE POLACRILEX 2 MG MT LOZG
2.0000 mg | LOZENGE | OROMUCOSAL | 3 refills | Status: AC | PRN
  Filled 2024-07-13 (×2): qty 72, 6d supply, fill #0

## 2024-07-13 MED ORDER — BUDESONIDE-FORMOTEROL FUMARATE 160-4.5 MCG/ACT IN AERO
2.0000 | INHALATION_SPRAY | Freq: Two times a day (BID) | RESPIRATORY_TRACT | 12 refills | Status: AC
  Filled 2024-07-13 (×2): qty 10.2, 30d supply, fill #0
  Filled 2024-08-27: qty 10.2, 30d supply, fill #1

## 2024-07-13 MED ORDER — SPIRIVA RESPIMAT 2.5 MCG/ACT IN AERS
2.0000 | INHALATION_SPRAY | Freq: Every day | RESPIRATORY_TRACT | 11 refills | Status: AC
Start: 2024-07-13 — End: ?
  Filled 2024-07-13 (×2): qty 4, 30d supply, fill #0

## 2024-07-13 NOTE — Telephone Encounter (Signed)
 Requested medications are due for refill today.  unsure  Requested medications are on the active medications list.  yes  Last refill. 06/04/2024 #120 0 rf  Future visit scheduled.   no  Notes to clinic.  Rx written to Expire 07/13/2024. Rx signed by Dr. Jossie    Requested Prescriptions  Pending Prescriptions Disp Refills   sucralfate  (CARAFATE ) 1 g tablet 120 tablet 0    Sig: Take 1 tablet (1 g total) by mouth 4 (four) times daily -  with meals and at bedtime.     Gastroenterology: Antiacids Passed - 07/13/2024  4:29 PM      Passed - Valid encounter within last 12 months    Recent Outpatient Visits           1 month ago Uncontrolled diabetes mellitus with hyperglycemia, with long-term current use of insulin  [facility])   [facility] Leavy Mole, PA-C   1 month ago Allergic rash present on examination   [facility] [name], DO   4 months ago Hospital discharge follow-up   [place] Leavy Mole, PA-C   5 months ago Exacerbation of persistent asthma, unspecified asthma severity   [facility] Leavy Mole, PA-C   5 months ago Encounter for examination following treatment at hospital   [facility] Leavy Mole, [place]       Future Appointments             In 4 days Dartha Ernst, MD [facility] Endocrinology   In 1 week Dunn, Bernardino HERO, PA-C [facility] [place]   In 1 month Leavy Mole, PA-C [facility]

## 2024-07-13 NOTE — Progress Notes (Unsigned)
 Synopsis: Referred in *** by Leavy Mole, PA-C  Assessment & Plan:   1. 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  - budesonide -formoterol  (SYMBICORT ) 160-4.5 MCG/ACT inhaler; Inhale 2 puffs into the lungs 2 (two) times daily.  Dispense: 10.2 g; Refill: 12 - Tiotropium Bromide  Monohydrate (SPIRIVA  RESPIMAT) 2.5 MCG/ACT AERS; Inhale 2 puffs into the lungs daily.  Dispense: 4 g; Refill: 11 - VENTOLIN  HFA 108 (90 Base) MCG/ACT inhaler; Inhale 2 puffs into the lungs every 6 (six) hours as needed for wheezing or shortness of breath.  Dispense: 18 g; Refill: 2  2. Mild persistent asthma without complication *** - budesonide -formoterol  (SYMBICORT ) 160-4.5 MCG/ACT inhaler; Inhale 2 puffs into the lungs 2 (two) times daily.  Dispense: 10.2 g; Refill: 12 - Tiotropium Bromide  Monohydrate (SPIRIVA  RESPIMAT) 2.5 MCG/ACT AERS; Inhale 2 puffs into the lungs daily.  Dispense: 4 g; Refill: 11 - VENTOLIN  HFA 108 (90 Base) MCG/ACT inhaler; Inhale 2 puffs into the lungs every 6 (six) hours as needed for wheezing or shortness of breath.  Dispense: 18 g; Refill: 2  3. Tobacco use disorder  Counseled regarding the importance of smoking cessation, prescribing nicotine  lozenges - nicotine  polacrilex (NICOTINE  MINI) 2 MG lozenge; Take 1 lozenge (2 mg total) by mouth every 2 (two) hours as needed for smoking cessation.  Dispense: 72 lozenge; Refill: 3   Return in about 6 months (around 01/10/2025).  I spent *** minutes caring for this patient today, including {EM billing:28027}  Belva November, MD [facility] 07/13/2024 2:43 PM    End of visit medications:  Meds ordered this encounter  Medications    budesonide -formoterol  (SYMBICORT ) 160-4.5 MCG/ACT inhaler    Sig: Inhale 2 puffs into the lungs 2 (two) times daily.    Dispense:  10.2 g    Refill:  12   nicotine  polacrilex (NICOTINE  MINI) 2 MG lozenge    Sig: Take 1 lozenge (2 mg total) by mouth every 2 (two) hours as needed for smoking cessation.    Dispense:  72 lozenge    Refill:  3   Tiotropium Bromide  Monohydrate (SPIRIVA  RESPIMAT) 2.5 MCG/ACT AERS    Sig: Inhale 2 puffs into the lungs daily.    Dispense:  4 g    Refill:  11   VENTOLIN  HFA 108 (90 Base) MCG/ACT inhaler    Sig: Inhale 2 puffs into the lungs every 6 (six) hours as needed for wheezing or shortness of breath.    Dispense:  18 g    Refill:  2     Current Outpatient Medications:    allopurinol  (ZYLOPRIM ) 100 MG tablet, Take 1 tablet (100 mg total) by mouth daily with the 300mg  tablet for a total daily dose of 400mg ., Disp: 90 tablet, Rfl: 1   allopurinol  (ZYLOPRIM ) 300 MG tablet, Take 1 tablet (300 mg total) by mouth daily as needed (Gout flare). Home med., Disp: , Rfl:    amLODipine  (NORVASC ) 10 MG tablet, Take 1 tablet (10 mg total) by mouth daily., Disp: 90 tablet, Rfl: 3   amoxicillin -clavulanate (AUGMENTIN ) 875-125 MG tablet, Take 1 tablet by mouth 2 (two) times daily for 10 days., Disp: 20 tablet, Rfl: 0   aspirin  EC 81 MG tablet, Take 1 tablet (81 mg total)  by mouth daily. Swallow whole., Disp: 90 tablet, Rfl: 3   baclofen  (LIORESAL ) 10 MG tablet, Take 1 tablet (10 mg total) by mouth 3 (three) times daily as needed for muscle spasms., Disp: 90 tablet, Rfl: 3   bisoprolol  (ZEBETA ) 5 MG tablet, Take 2 tablets (10 mg total) by mouth every morning AND 1 tablet (5 mg total) at bedtime., Disp: 270 tablet, Rfl: 3   cetirizine  (ZYRTEC ) 10 MG tablet, Take 1 tablet (10 mg total) by mouth daily., Disp: 30 tablet, Rfl: 11   cromolyn  (OPTICROM ) 4 % ophthalmic solution, Place 1 drop into both eyes 4 (four) times daily as needed., Disp: 10 mL, Rfl: 2   Dulaglutide  (TRULICITY )  3 MG/0.5ML SOAJ, Inject 3 mg into the skin once a week., Disp: 2 mL, Rfl: 10   DULoxetine  (CYMBALTA ) 30 MG capsule, Take 2 capsules (60 mg total) by mouth every morning AND 1 capsule (30 mg total) every evening., Disp: 270 capsule, Rfl: 1   empagliflozin  (JARDIANCE ) 10 MG TABS tablet, Take 1 tablet (10 mg total) by mouth daily., Disp: 30 tablet, Rfl: 5   Evolocumab  (REPATHA  SURECLICK) 140 MG/ML SOAJ, Inject 140 mg into the skin every 14 (fourteen) days., Disp: 2 mL, Rfl: 2   ezetimibe  (ZETIA ) 10 MG tablet, Take 1 tablet (10 mg total) by mouth daily., Disp: 90 tablet, Rfl: 1   fluticasone  (FLONASE ) 50 MCG/ACT nasal spray, Place 1 spray into both nostrils daily., Disp: 16 g, Rfl: 6   insulin  glargine (LANTUS  SOLOSTAR) 100 UNIT/ML Solostar Pen, Inject 20-25 Units into the skin at bedtime., Disp: 30 mL, Rfl: 1   insulin  lispro (HUMALOG ) 100 UNIT/ML KwikPen, Inject 10 Units into the skin 3 (three) times daily. Plus sliding scale up per instructions, Disp: 27 mL, Rfl: 1   ipratropium-albuterol  (DUONEB) 0.5-2.5 (3) MG/3ML SOLN, Take 3 mLs by nebulization every 6 (six) hours as needed., Disp: 180 mL, Rfl: 1   levocetirizine (XYZAL ) 5 MG tablet, Take 1 tablet (5 mg total) by mouth every evening., Disp: 90 tablet, Rfl: 1   nicotine  polacrilex (NICOTINE  MINI) 2 MG lozenge, Take 1 lozenge (2 mg total) by mouth every 2 (two) hours as needed for smoking cessation., Disp: 72 lozenge, Rfl: 3   pantoprazole  (PROTONIX ) 40 MG tablet, Take 1 tablet (40 mg total) by mouth daily., Disp: 30 tablet, Rfl: 1   prasugrel  (EFFIENT ) 10 MG TABS tablet, Take 1 tablet (10 mg total) by mouth daily., Disp: 90 tablet, Rfl: 3   pregabalin  (LYRICA ) 50 MG capsule, Take 1 capsule (50 mg total) by mouth 2 (two) times daily as needed. Home med., Disp: , Rfl:    ranolazine  (RANEXA ) 500 MG 12 hr tablet, Take 1 tablet (500 mg total) by mouth 2 (two) times daily., Disp: 60 tablet, Rfl: 2   rosuvastatin  (CRESTOR ) 40 MG tablet, Take 1 tablet (40  mg total) by mouth daily., Disp: 90 tablet, Rfl: 1   spironolactone  (ALDACTONE ) 25 MG tablet, Take 1 tablet (25 mg total) by mouth daily., Disp: 90 tablet, Rfl: 3   sucralfate  (CARAFATE ) 1 g tablet, Take 1 tablet (1 g total) by mouth 4 (four) times daily -  with meals and at bedtime., Disp: 120 tablet, Rfl: 0   torsemide  (DEMADEX ) 20 MG tablet, Take 2 tablets (40 mg total) by mouth daily., Disp: 60 tablet, Rfl: 6   valsartan  (DIOVAN ) 80 MG tablet, Take 1 tablet (80 mg total) by mouth daily., Disp: 30 tablet, Rfl: 5   bisoprolol  (ZEBETA ) 5 MG  tablet, Take 2 tablets (10 mg total) by mouth daily., Disp: 180 tablet, Rfl: 2   budesonide -formoterol  (SYMBICORT ) 160-4.5 MCG/ACT inhaler, Inhale 2 puffs into the lungs 2 (two) times daily., Disp: 10.2 g, Rfl: 12   Continuous Glucose Sensor (FREESTYLE LIBRE 3 SENSOR) MISC, Place 1 sensor onto skin every 14 (fourteen) days to monitor blood sugar continuously (Patient not taking: Reported on 07/13/2024), Disp: 6 each, Rfl: 2   nicotine  (NICODERM CQ  - DOSED IN MG/24 HOURS) 21 mg/24hr patch, Place 1 patch (21 mg total) onto the skin daily., Disp: 28 patch, Rfl: 0   Tiotropium Bromide  Monohydrate (SPIRIVA  RESPIMAT) 2.5 MCG/ACT AERS, Inhale 2 puffs into the lungs daily., Disp: 4 g, Rfl: 11   VENTOLIN  HFA 108 (90 Base) MCG/ACT inhaler, Inhale 2 puffs into the lungs every 6 (six) hours as needed for wheezing or shortness of breath., Disp: 18 g, Rfl: 2   Subjective:   PATIENT ID: Cory Rivas GENDER: male DOB: [birth year], MRN: [id number]  Chief Complaint  Patient presents with   Asthma    Cough, shortness of breath and wheezing.     HPI   Return visit 07/13/2024:  Presents for follow-up after multiple recent hospitalizations, last of which with a diagnosis of NSTEMI.  His LAD was near totally occluded quired a drug-eluting stent.  Following discharge, he is felt increased exertional dyspnea and is unable to walk same distance as prior.  He has an occasional  cough and occasional wheezing but this is improved from prior.  He is using his Symbicort  and Spiriva .  He unfortunately continues to smoke, currently smoking a couple of cigarettes a day.  Ancillary information including prior medications, full medical/surgical/family/social histories, and PFTs (when available) are listed below and have been reviewed.   {PULM QUESTIONNAIRES (Optional):33196}  ROS   Objective:   Vitals:   07/13/24 1421  BP: 130/70  Pulse: (!) 54  Temp: 97.6 F (36.4 C)  TempSrc: Temporal  SpO2: 98%  Weight: 223 lb 12.8 oz (101.5 kg)  Height: 5' 7 (1.702 m)   98% on *** LPM *** RA BMI Readings from Last 3 Encounters:  07/13/24 35.05 kg/m  07/07/24 35.52 kg/m  06/23/24 35.52 kg/m   Wt Readings from Last 3 Encounters:  07/13/24 223 lb 12.8 oz (101.5 kg)  07/07/24 226 lb 12.8 oz (102.9 kg)  06/23/24 226 lb 12.8 oz (102.9 kg)    Physical Exam    Ancillary Information    Past Medical History:  Diagnosis Date   Bulging of cervical intervertebral disc    CAD (coronary artery disease)    a. 09/2020 MV: low risk; b. 05/2023 Cath: Moderate LAD dzs w/ seq 50-60% stenoses and nl RFR 0.93. Mod OM dzs->Med rx; b. 10/2023 Cath: LM min irregs, LAD 55ost, 55p/m, LCX large nl, OM1 50, RCA mod, nl.   Cervical myelopathy (HCC)    Chronic heart failure with preserved ejection fraction (HFpEF) (HCC)    a. 05/2023 Echo: EF 50-55%, no rwma, GrIII DD, nl RV fxn, mild MR, Ao sclerosis; b. 10/2023 Echo: EF 55-60%, no rwma, nl RV fxn, mild MR, Ao sclerosis.   CKD (chronic kidney disease), stage III (HCC)    COPD (chronic obstructive pulmonary disease) (HCC)    Diabetes mellitus without complication (HCC)    Gout    Hyperlipidemia LDL goal <70    Hypertension    Morbid obesity (HCC)    OSA (obstructive sleep apnea)    Stress due to family tension 04/15/2019  Tracheobronchomalacia      Family History  Problem Relation Age of Onset   Heart disease Mother    Diabetes  Mother    Hyperlipidemia Mother    Hypertension Mother    Heart attack Mother    Lung cancer Father    Hypertension Father    Stroke Father    AAA (abdominal aortic aneurysm) Maternal Grandmother    Heart attack Maternal Grandmother    Diabetes Maternal Grandfather      Past Surgical History:  Procedure Laterality Date   BACK SURGERY     CARDIAC CATHETERIZATION     CHOLECYSTECTOMY     COLONOSCOPY WITH PROPOFOL  N/A 03/10/2020   Procedure: COLONOSCOPY WITH PROPOFOL ;  Surgeon: Jinny Carmine, MD;  Location: [facility];  Service: Endoscopy;  Laterality: N/A;   COLONOSCOPY WITH PROPOFOL  N/A 09/25/2023   Procedure: COLONOSCOPY WITH PROPOFOL ;  Surgeon: Jinny Carmine, MD;  Location: [facility];  Service: Endoscopy;  Laterality: N/A;   CORONARY IMAGING/OCT N/A 06/08/2024   Procedure: CORONARY IMAGING/OCT;  Surgeon: Darron Deatrice LABOR, MD;  Location: [facility];  Service: Cardiovascular;  Laterality: N/A;   CORONARY PRESSURE/FFR STUDY N/A 05/29/2023   Procedure: CORONARY PRESSURE/FFR STUDY;  Surgeon: Mady Bruckner, MD;  Location: [facility];  Service: Cardiovascular;  Laterality: N/A;   CORONARY STENT INTERVENTION N/A 06/08/2024   Procedure: CORONARY STENT INTERVENTION;  Surgeon: Darron Deatrice LABOR, MD;  Location: [facility];  Service: Cardiovascular;  Laterality: N/A;   LEFT HEART CATH AND CORONARY ANGIOGRAPHY N/A 06/08/2024   Procedure: LEFT HEART CATH AND CORONARY ANGIOGRAPHY;  Surgeon: Darron Deatrice LABOR, MD;  Location: [facility];  Service: Cardiovascular;  Laterality: N/A;   POLYPECTOMY  09/25/2023   Procedure: POLYPECTOMY;  Surgeon: Jinny Carmine, MD;  Location: [facility];  Service: Endoscopy;;   RIGHT/LEFT HEART CATH AND CORONARY ANGIOGRAPHY N/A 05/29/2023   Procedure: RIGHT/LEFT HEART CATH AND CORONARY ANGIOGRAPHY;  Surgeon: Mady Bruckner, MD;  Location: [facility];  Service: Cardiovascular;  Laterality: N/A;   RIGHT/LEFT  HEART CATH AND CORONARY ANGIOGRAPHY Bilateral 10/30/2023   Procedure: RIGHT/LEFT HEART CATH AND CORONARY ANGIOGRAPHY;  Surgeon: Mady Bruckner, MD;  Location: [facility];  Service: Cardiovascular;  Laterality: Bilateral;    Social History   Socioeconomic History   Marital status: Divorced    Spouse name: Not on file   Number of children: 0   Years of education: 9   Highest education level: 9th grade  Occupational History   Occupation: disability  Tobacco Use   Smoking status: Every Day    Current packs/day: 0.50    Average packs/day: 1 pack/day for 38.3 years (37.7 ttl pk-yrs)    Types: Cigarettes    Start date: 10/19/1989    Last attempt to quit: 08/20/2022   Smokeless tobacco: Never   Tobacco comments:    30+ years hx smoking 2 ppd, stopped smoking briefly in April 2022 for a surgery and then restarted smoking.  As of August 2025, he smoking about 2 to 4 cigarettes a day.  Vaping Use   Vaping status: Never Used  Substance and Sexual Activity   Alcohol use: No   Drug use: No   Sexual activity: Not Currently    Partners: Female  Other Topics Concern   Not on file  Social History Narrative   Lives with sister [name], brother in law [name]), and his mother - Vardaan Depascale   Does not routinely exercise.   Social Drivers of  Health   Financial Resource Strain: High Risk (01/21/2024)   Overall Financial Resource Strain (CARDIA)    Difficulty of Paying Living Expenses: Hard  Food Insecurity: No Food Insecurity (04/04/2024)   Hunger Vital Sign    Worried About Running Out of Food in the Last Year: Never true    Ran Out of Food in the Last Year: Never true  Transportation Needs: No Transportation Needs (04/04/2024)   PRAPARE - Administrator, Civil Service (Medical): No    Lack of Transportation (Non-Medical): No  Physical Activity: Insufficiently Active (12/06/2022)   Exercise Vital Sign    Days of Exercise per Week: 3 days    Minutes of Exercise per Session: 10  min  Stress: No Stress Concern Present (12/06/2022)   [employer] of Occupational Health - Occupational Stress Questionnaire    Feeling of Stress : Only a little  Social Connections: Socially Isolated (02/17/2024)   Social Connection and Isolation Panel    Frequency of Communication with Friends and Family: More than three times a week    Frequency of Social Gatherings with Friends and Family: Twice a week    Attends Religious Services: Never    Database administrator or Organizations: No    Attends Banker Meetings: Never    Marital Status: Divorced  Catering manager Violence: Not At Risk (04/04/2024)   Humiliation, Afraid, Rape, and Kick questionnaire    Fear of Current or Ex-Partner: No    Emotionally Abused: No    Physically Abused: No    Sexually Abused: No     Allergies  Allergen Reactions   Oxycodone  Nausea And Vomiting   Entresto  [Sacubitril -Valsartan ] Nausea Only and Other (See Comments)    Lightheaded and dizzy    Onion Nausea And Vomiting   Nitroglycerin  Itching and Rash    IV product, patient says SL tablet is fine     CBC    Component Value Date/Time   WBC 12.0 (H) 07/07/2024 1824   RBC 5.50 07/07/2024 1824   HGB 16.1 07/07/2024 1824   HGB 17.2 10/28/2023 1139   HCT 46.6 07/07/2024 1824   HCT 51.6 (H) 10/28/2023 1139   PLT 226 07/07/2024 1824   PLT 198 10/28/2023 1139   MCV 84.7 07/07/2024 1824   MCV 87 10/28/2023 1139   MCH 29.3 07/07/2024 1824   MCHC 34.5 07/07/2024 1824   RDW 13.1 07/07/2024 1824   RDW 13.3 10/28/2023 1139   LYMPHSABS 1.2 04/12/2024 1438   LYMPHSABS 2.1 08/01/2022 1057   MONOABS 0.4 04/12/2024 1438   EOSABS 0.1 04/12/2024 1438   EOSABS 0.1 08/01/2022 1057   BASOSABS 0.0 04/12/2024 1438   BASOSABS 0.0 08/01/2022 1057    Pulmonary Functions Testing Results:    Latest Ref Rng & Units 10/05/2022    3:54 PM  PFT Results  FVC-Pre L 2.54   FVC-Predicted Pre % 57   FVC-Post L 3.01   FVC-Predicted Post % 67   Pre  FEV1/FVC % % 76   Post FEV1/FCV % % 71   FEV1-Pre L 1.93   FEV1-Predicted Pre % 56   FEV1-Post L 2.15   DLCO uncorrected ml/min/mmHg 21.47   DLCO UNC% % 82   DLVA Predicted % 98   TLC L 5.07   TLC % Predicted % 79   RV % Predicted % 108     Outpatient Medications Prior to Visit  Medication Sig Dispense Refill   allopurinol  (ZYLOPRIM ) 100 MG tablet Take 1  tablet (100 mg total) by mouth daily with the 300mg  tablet for a total daily dose of 400mg . 90 tablet 1   allopurinol  (ZYLOPRIM ) 300 MG tablet Take 1 tablet (300 mg total) by mouth daily as needed (Gout flare). Home med.     amLODipine  (NORVASC ) 10 MG tablet Take 1 tablet (10 mg total) by mouth daily. 90 tablet 3   amoxicillin -clavulanate (AUGMENTIN ) 875-125 MG tablet Take 1 tablet by mouth 2 (two) times daily for 10 days. 20 tablet 0   aspirin  EC 81 MG tablet Take 1 tablet (81 mg total) by mouth daily. Swallow whole. 90 tablet 3   baclofen  (LIORESAL ) 10 MG tablet Take 1 tablet (10 mg total) by mouth 3 (three) times daily as needed for muscle spasms. 90 tablet 3   bisoprolol  (ZEBETA ) 5 MG tablet Take 2 tablets (10 mg total) by mouth every morning AND 1 tablet (5 mg total) at bedtime. 270 tablet 3   cetirizine  (ZYRTEC ) 10 MG tablet Take 1 tablet (10 mg total) by mouth daily. 30 tablet 11   cromolyn  (OPTICROM ) 4 % ophthalmic solution Place 1 drop into both eyes 4 (four) times daily as needed. 10 mL 2   Dulaglutide  (TRULICITY ) 3 MG/0.5ML SOAJ Inject 3 mg into the skin once a week. 2 mL 10   DULoxetine  (CYMBALTA ) 30 MG capsule Take 2 capsules (60 mg total) by mouth every morning AND 1 capsule (30 mg total) every evening. 270 capsule 1   empagliflozin  (JARDIANCE ) 10 MG TABS tablet Take 1 tablet (10 mg total) by mouth daily. 30 tablet 5   Evolocumab  (REPATHA  SURECLICK) 140 MG/ML SOAJ Inject 140 mg into the skin every 14 (fourteen) days. 2 mL 2   ezetimibe  (ZETIA ) 10 MG tablet Take 1 tablet (10 mg total) by mouth daily. 90 tablet 1    fluticasone  (FLONASE ) 50 MCG/ACT nasal spray Place 1 spray into both nostrils daily. 16 g 6   insulin  glargine (LANTUS  SOLOSTAR) 100 UNIT/ML Solostar Pen Inject 20-25 Units into the skin at bedtime. 30 mL 1   insulin  lispro (HUMALOG ) 100 UNIT/ML KwikPen Inject 10 Units into the skin 3 (three) times daily. Plus sliding scale up per instructions 27 mL 1   ipratropium-albuterol  (DUONEB) 0.5-2.5 (3) MG/3ML SOLN Take 3 mLs by nebulization every 6 (six) hours as needed. 180 mL 1   levocetirizine (XYZAL ) 5 MG tablet Take 1 tablet (5 mg total) by mouth every evening. 90 tablet 1   pantoprazole  (PROTONIX ) 40 MG tablet Take 1 tablet (40 mg total) by mouth daily. 30 tablet 1   prasugrel  (EFFIENT ) 10 MG TABS tablet Take 1 tablet (10 mg total) by mouth daily. 90 tablet 3   pregabalin  (LYRICA ) 50 MG capsule Take 1 capsule (50 mg total) by mouth 2 (two) times daily as needed. Home med.     ranolazine  (RANEXA ) 500 MG 12 hr tablet Take 1 tablet (500 mg total) by mouth 2 (two) times daily. 60 tablet 2   rosuvastatin  (CRESTOR ) 40 MG tablet Take 1 tablet (40 mg total) by mouth daily. 90 tablet 1   spironolactone  (ALDACTONE ) 25 MG tablet Take 1 tablet (25 mg total) by mouth daily. 90 tablet 3   sucralfate  (CARAFATE ) 1 g tablet Take 1 tablet (1 g total) by mouth 4 (four) times daily -  with meals and at bedtime. 120 tablet 0   torsemide  (DEMADEX ) 20 MG tablet Take 2 tablets (40 mg total) by mouth daily. 60 tablet 6   valsartan  (DIOVAN ) 80 MG  tablet Take 1 tablet (80 mg total) by mouth daily. 30 tablet 5   budesonide -formoterol  (SYMBICORT ) 160-4.5 MCG/ACT inhaler Inhale 2 puffs into the lungs 2 (two) times daily. 10.2 g 12   Tiotropium Bromide  Monohydrate (SPIRIVA  RESPIMAT) 2.5 MCG/ACT AERS Inhale 2 puffs into the lungs daily. 4 g 11   VENTOLIN  HFA 108 (90 Base) MCG/ACT inhaler Inhale 2 puffs into the lungs every 6 (six) hours as needed for wheezing or shortness of breath. 18 g 2   bisoprolol  (ZEBETA ) 5 MG tablet Take 2  tablets (10 mg total) by mouth daily. 180 tablet 2   Continuous Glucose Sensor (FREESTYLE LIBRE 3 SENSOR) MISC Place 1 sensor onto skin every 14 (fourteen) days to monitor blood sugar continuously (Patient not taking: Reported on 07/13/2024) 6 each 2   nicotine  (NICODERM CQ  - DOSED IN MG/24 HOURS) 21 mg/24hr patch Place 1 patch (21 mg total) onto the skin daily. 28 patch 0   nicotine  polacrilex (NICOTINE  MINI) 4 MG lozenge Take 1 lozenge (4 mg total) by mouth as needed. (Patient not taking: Reported on 07/13/2024) 100 tablet 0   No facility-administered medications prior to visit.

## 2024-07-14 ENCOUNTER — Other Ambulatory Visit: Payer: Self-pay

## 2024-07-14 ENCOUNTER — Ambulatory Visit: Admitting: Physical Therapy

## 2024-07-14 ENCOUNTER — Encounter: Payer: Self-pay | Admitting: Physical Therapy

## 2024-07-14 ENCOUNTER — Telehealth: Payer: Self-pay | Admitting: *Deleted

## 2024-07-14 VITALS — BP 150/76 | HR 64

## 2024-07-14 DIAGNOSIS — R293 Abnormal posture: Secondary | ICD-10-CM

## 2024-07-14 DIAGNOSIS — M542 Cervicalgia: Secondary | ICD-10-CM

## 2024-07-14 DIAGNOSIS — I10 Essential (primary) hypertension: Secondary | ICD-10-CM

## 2024-07-14 DIAGNOSIS — M62838 Other muscle spasm: Secondary | ICD-10-CM

## 2024-07-14 DIAGNOSIS — M6281 Muscle weakness (generalized): Secondary | ICD-10-CM

## 2024-07-14 DIAGNOSIS — M5412 Radiculopathy, cervical region: Secondary | ICD-10-CM

## 2024-07-14 MED FILL — Rosuvastatin Calcium Tab 40 MG: ORAL | 30 days supply | Qty: 30 | Fill #1 | Status: AC

## 2024-07-14 MED FILL — Ezetimibe Tab 10 MG: ORAL | 30 days supply | Qty: 30 | Fill #1 | Status: CN

## 2024-07-14 MED FILL — Rosuvastatin Calcium Tab 40 MG: ORAL | 30 days supply | Qty: 30 | Fill #1 | Status: CN

## 2024-07-14 MED FILL — Ezetimibe Tab 10 MG: ORAL | 30 days supply | Qty: 30 | Fill #1 | Status: AC

## 2024-07-14 MED FILL — Allopurinol Tab 100 MG: ORAL | 30 days supply | Qty: 30 | Fill #1 | Status: AC

## 2024-07-14 MED FILL — Duloxetine HCl Enteric Coated Pellets Cap 30 MG (Base Eq): ORAL | 30 days supply | Qty: 90 | Fill #1 | Status: AC

## 2024-07-14 NOTE — Progress Notes (Signed)
 Complex Care Management Note Care Guide Note  07/14/2024 Name: NEPHI SAVAGE MRN: [id number] DOB: 04-Apr-1966   Complex Care Management Outreach Attempts: An unsuccessful telephone outreach was attempted today to offer the patient information about available complex care management services.  Follow Up Plan:  Additional outreach attempts will be made to offer the patient complex care management information and services.   Encounter Outcome:  No Answer  Harlene Satterfield  [facility]  [facility] Guide  Direct Dial : [phone]  Fax [phone]

## 2024-07-14 NOTE — Therapy (Signed)
 OUTPATIENT PHYSICAL THERAPY RE-EVALUATION Reason for RE-Evaluation: patient with change in medical status. Hospitalized for NSTEMI and is now s/p PCI/DES LAD on 06/08/2024.    Patient Name: Cory Rivas MRN: [id number] DOB:04/30/1966, 58 y.o., male Today's Date: 07/14/2024  END OF SESSION:  PT End of Session - 07/14/24 1034     Visit Number 6    Number of Visits 17    Date for Recertification  10/06/24    Authorization Type [place] [employer]ICA[employer] BLUE reporting period from 05/19/2024    Authorization Time Period HB auth#[id number] for 13 PT vsts from 8/7-11/5    Authorization - Visit Number 5    Authorization - Number of Visits 13    Progress Note Due on Visit 10    PT Start Time 1034    PT Stop Time 1115    PT Time Calculation (min) 41 min    Activity Tolerance Patient tolerated treatment well    Behavior During Therapy [facility] for tasks assessed/performed               Past Medical History:  Diagnosis Date   Bulging of cervical intervertebral disc    CAD (coronary artery disease)    a. 09/2020 MV: low risk; b. 05/2023 Cath: Moderate LAD dzs w/ seq 50-60% stenoses and nl RFR 0.93. Mod OM dzs->Med rx; b. 10/2023 Cath: LM min irregs, LAD 55ost, 55p/m, LCX large nl, OM1 50, RCA mod, nl.   Cervical myelopathy (HCC)    Chronic heart failure with preserved ejection fraction (HFpEF) (HCC)    a. 05/2023 Echo: EF 50-55%, no rwma, GrIII DD, nl RV fxn, mild MR, Ao sclerosis; b. 10/2023 Echo: EF 55-60%, no rwma, nl RV fxn, mild MR, Ao sclerosis.   CKD (chronic kidney disease), stage III (HCC)    COPD (chronic obstructive pulmonary disease) (HCC)    Diabetes mellitus without complication (HCC)    Gout    Hyperlipidemia LDL goal <70    Hypertension    Morbid obesity (HCC)    OSA (obstructive sleep apnea)    Stress due to family tension 04/15/2019   Tracheobronchomalacia    Past Surgical History:  Procedure Laterality Date   BACK SURGERY     CARDIAC CATHETERIZATION     CHOLECYSTECTOMY      COLONOSCOPY WITH PROPOFOL  N/A 03/10/2020   Procedure: COLONOSCOPY WITH PROPOFOL ;  Surgeon: Jinny Carmine, MD;  Location: [facility] ENDOSCOPY;  Service: Endoscopy;  Laterality: N/A;   COLONOSCOPY WITH PROPOFOL  N/A 09/25/2023   Procedure: COLONOSCOPY WITH PROPOFOL ;  Surgeon: Jinny Carmine, MD;  Location: [facility] ENDOSCOPY;  Service: Endoscopy;  Laterality: N/A;   CORONARY IMAGING/OCT N/A 06/08/2024   Procedure: CORONARY IMAGING/OCT;  Surgeon: Darron Deatrice LABOR, MD;  Location: [facility] INVASIVE CV LAB;  Service: Cardiovascular;  Laterality: N/A;   CORONARY PRESSURE/FFR STUDY N/A 05/29/2023   Procedure: CORONARY PRESSURE/FFR STUDY;  Surgeon: Mady Bruckner, MD;  Location: [facility] INV[facility]IVE CV LAB;  Service: Cardiovascular;  Laterality: N/A;   CORONARY STENT INTERVENTION N/A 06/08/2024   Procedure: CORONARY STENT INTERVENTION;  Surgeon: Darron Deatrice LABOR, MD;  Location: [facility];  Service: Cardiovascular;  Laterality: N/A;   LEFT HEART CATH AND CORONARY ANGIOGRAPHY N/A 06/08/2024   Procedure: LEFT HEART CATH AND CORONARY ANGIOGRAPHY;  Surgeon: Darron Deatrice LABOR, MD;  Location: [facility];  Service: Cardiovascular;  Laterality: N/A;   POLYPECTOMY  09/25/2023   Procedure: POLYPECTOMY;  Surgeon: Jinny Carmine, MD;  Location: [facility] ENDOSCOPY;  Service: Endoscopy;;   RIGHT/LEFT HEART CATH  AND CORONARY ANGIOGRAPHY N/A 05/29/2023   Procedure: RIGHT/LEFT HEART CATH AND CORONARY ANGIOGRAPHY;  Surgeon: Mady Bruckner, MD;  Location: [facility];  Service: Cardiovascular;  Laterality: N/A;   RIGHT/LEFT HEART CATH AND CORONARY ANGIOGRAPHY Bilateral 10/30/2023   Procedure: RIGHT/LEFT HEART CATH AND CORONARY ANGIOGRAPHY;  Surgeon: Mady Bruckner, MD;  Location: [facility];  Service: Cardiovascular;  Laterality: Bilateral;   Patient Active Problem List   Diagnosis Date Noted   Chest pain 06/08/2024   Hypertensive urgency 06/08/2024   Non-ST elevation (NSTEMI) myocardial infarction (HCC)  06/08/2024   Chronic kidney disease, stage 3a (HCC) 05/17/2024   Stable angina 05/17/2024   Acute on chronic diastolic (congestive) heart failure (HCC) 04/03/2024   Class 3 obesity 02/18/2024   Nicotine  dependence 01/23/2024   Acute pulmonary edema (HCC) 01/22/2024   CHF (congestive heart failure) (HCC) 01/20/2024   COPD exacerbation (HCC) 10/21/2023   History of colon polyps 09/25/2023   Myocardial injury 08/19/2023   Type II diabetes mellitus with renal manifestations (HCC) 08/19/2023   Obesity (BMI 30-39.9) 08/19/2023   HLD (hyperlipidemia) 08/19/2023   Chronic diastolic CHF (congestive heart failure) (HCC) 08/19/2023   Diarrhea 08/19/2023   CAD (coronary artery disease) 08/19/2023   CKD (chronic kidney disease) stage 2, GFR 60-89 ml/min 08/19/2023   Acute on chronic diastolic heart failure (HCC) 05/30/2023   Unstable angina (HCC) 05/29/2023   Pleuritic chest pain 05/27/2023   Type 2 diabetes mellitus with peripheral neuropathy (HCC) 05/27/2023   Gastroesophageal reflux disease without esophagitis 05/27/2023   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   Dyslipidemia 05/27/2023   Hypertensive emergency 11/22/2022   Acute respiratory failure with hypoxia (HCC) 11/22/2022   Elevated troponin 11/22/2022   Chronic heart failure with preserved ejection fraction (HFpEF) (HCC) 08/24/2022   Acute on chronic diastolic congestive heart failure (HCC) 08/23/2022   Class 2 obesity 08/23/2022   Uncontrolled diabetes mellitus with hyperglycemia, with long-term current use of insulin  (HCC) 08/23/2022   Grade I diastolic dysfunction 06/08/2022   LVH (left ventricular hypertrophy) 06/08/2022   Accelerated hypertension 06/08/2022   S/P cervical spinal fusion 08/18/2021   Former heavy tobacco smoker 08/18/2021   Gout 08/18/2021   Other chronic pain 10/26/2020   Systolic murmur 10/26/2020   Tobacco use 10/26/2020   Polyp of ascending colon    Rectal polyp    Class 2 severe obesity with serious  comorbidity and body mass index (BMI) of 39.0 to 39.9 in adult 04/15/2019   Chronic kidney disease (CKD) stage G2/A3, mildly decreased glomerular filtration rate (GFR) between 60-89 mL/min/1.73 square meter and albuminuria creatinine ratio greater than 300 mg/g 04/15/2019   Proteinuria 04/15/2019   Hyperlipidemia associated with type 2 diabetes mellitus (HCC) 03/18/2019   Diabetes mellitus (HCC) 03/18/2019   Idiopathic chronic gout of multiple sites without tophus 03/18/2019   Essential hypertension 03/18/2019   Dermatitis 03/18/2019   Cervical myelopathy (HCC) 11/22/2015   Osteoarthritis of spine with radiculopathy, lumbar region 11/22/2015   Degenerative disc disease, cervical 11/22/2015   Osteoarthritis of spine with radiculopathy, cervical region 11/22/2015    PCP: Leavy Mole, PA-C  REFERRING PROVIDER: Corrothers, Duwaine Norris, NP (neurosurgery)  REFERRING DIAG:  s/p cervical spinal fusion, neck pain, cervicalgia  THERAPY DIAG:  Cervicalgia  Radiculopathy, cervical region  Muscle weakness (generalized)  Other muscle spasm  Abnormal posture  Rationale for Evaluation and Treatment: Rehabilitation  ONSET DATE: chronic over many years but underwent C2-6 posterior laminectomy with C2-T2 instrumentation and fusion with Dr. Salina in  April 2022   SUBJECTIVE:                                                                                                                                                                                                         PERTINENT HISTORY:  Patient is a 58 y.o. male who presents to outpatient physical therapy with a referral for medical diagnosis  s/p cervical spinal fusion, neck pain, cervicalgia. This patient's chief complaints consist of chronic neck pain and stiffness, UE paresthesia, weakness, and incoordination leading to the following functional deficits: difficulty with usual activities such as using his tractor, being active, personal  care, lifting, reading, concentration, work, driving, sleeping, reaching, holding and manipulating things such as a cup and utinsils with hands, eating, shopping, carrying groceries, and recreation. Relevant past medical history and comorbidities include the following: he has NSTEMI s/p PCI/DES LAD on 06/08/2024; Lumbar degenerative disc disease; Cervical myelopathy (HCC); Osteoarthritis of spine with radiculopathy, lumbar region; Hyperlipidemia associated with type 2 diabetes mellitus (HCC); Diabetes mellitus (HCC); Idiopathic chronic gout of multiple sites without tophus; Essential hypertension; Dermatitis; Class 2 severe obesity with serious comorbidity and body mass index (BMI) of 39.0 to 39.9 in adult [facility]); Chronic kidney disease (CKD) stage G2/A3, mildly decreased glomerular filtration rate (GFR) between 60-89 mL/min/1.73 square meter and albuminuria creatinine ratio greater than 300 mg/g; Proteinuria; Polyp of ascending colon; Rectal polyp; Other chronic pain; Systolic murmur; Tobacco use; S/P cervical spinal fusion; Former heavy tobacco smoker; Gout; Grade I diastolic dysfunction; LVH (left ventricular hypertrophy); Accelerated hypertension; Acute on chronic diastolic congestive heart failure (HCC); Class 2 obesity; Uncontrolled diabetes mellitus with hyperglycemia, with long-term current use of insulin  (HCC); Acute on chronic heart failure with preserved ejection fraction (HFpEF) (HCC); Hypertensive emergency; Acute respiratory failure with hypoxia (HCC); Elevated troponin; Pleuritic chest pain; Type 2 diabetes mellitus with peripheral neuropathy (HCC); Gastroesophageal reflux disease without esophagitis; COPD with asthma (HCC); Dyslipidemia; Unstable angina (HCC); Acute on chronic diastolic heart failure (HCC); Myocardial injury; Type II diabetes mellitus with renal manifestations (HCC); Obesity (BMI 30-39.9); HLD (hyperlipidemia); Chronic diastolic CHF (congestive heart failure) (HCC); Diarrhea; CAD  (coronary artery disease); CKD (chronic kidney disease) stage 2, GFR 60-89 ml/min; Degenerative disc disease, cervical; Osteoarthritis of spine with radiculopathy, cervical region; History of colon polyps; COPD exacerbation (HCC); CHF (congestive heart failure) (HCC); Acute pulmonary edema (HCC); Nicotine  dependence; Class 3 obesity; Acute on chronic diastolic (congestive) heart failure (HCC); Chronic kidney disease, stage 3a (HCC); and Stable angina (HCC) on their problem list. he  has a past medical history of Bulging  of cervical intervertebral disc, CAD (coronary artery disease), Cervical myelopathy (HCC), Chronic heart failure with preserved ejection fraction (HFpEF) (HCC), CKD (chronic kidney disease), stage III (HCC), COPD (chronic obstructive pulmonary disease) (HCC), Gout, Hyperlipidemia LDL goal <70, Hypertension, Morbid obesity (HCC), OSA (obstructive sleep apnea), Stress due to family tension (04/15/2019), and Tracheobronchomalacia. he  has a past surgical history that includes Cholecystectomy; Colonoscopy with propofol  (N/A, 03/10/2020); RIGHT/LEFT HEART CATH AND CORONARY ANGIOGRAPHY (N/A, 05/29/2023); CORONARY PRESSURE/FFR STUDY (N/A, 05/29/2023); Cardiac catheterization; Back surgery; Colonoscopy with propofol  (N/A, 09/25/2023); polypectomy (09/25/2023); and RIGHT/LEFT HEART CATH AND CORONARY ANGIOGRAPHY (Bilateral, 10/30/2023). Patient denies hx of cancer, stroke, seizures, unexplained weight loss, unexplained changes in bowel or bladder problems, and osteoporosis  Exercise history: no weights at home.   SUBJECTIVE STATEMENT: Patient was hospitalized 06/08/24). He was hospitalized and had a stent placed on 06/08/24. He was cleared to return to PT by his cardiologist but when he attempted to return to PT on 9/11 he was unable to participate because of systolic BP above 180 bpm. Since then he was seen at the ED 07/07/2024 for diverticulitis, hepatomegaly, and body aches. He states his joints still hurt  a little but he is feeling better. He is taking a blood thinner and PT was held until last week to allow the blood pressure med change to have time to take effect. He states his neck pain is not too bad today and he still has trouble wit the strength of his L > R UE. He continues to have numbness in B UE.   NSTEMI s/p PCI/DES LAD on 06/08/2024  Top three problems list: hand grip/holding things, tightness in B UT (driving, walking, sitting, UBE,   PAIN: Are you having pain? NPRS: Current: 7-8/10 in upper thoracic spine and base of neck radiating to shoulders  From initial eval 05/19/24:  Best: 2/10, Worst: 10/10. Pain location: back of neck down to just past the scapulae, bilateral upper traps to tops of shoulders, sore over the clavicles and throat to touch. Numbness and tingling bilateral hands (R > L). Numbness in the R hand 2nd and 3rd digits all the time, but can be all the fingers of the R hand. He gets shooting down the left arm to the middle finger and increased numbness spreading from that finger.  Pain description: I can feel where they cut me, muscle spasm (vibrating), choking, cramp Aggravating factors: bending, lifting, raking, driving for a long distance Relieving factors: stay active, getting out to do things he usually does   PRECAUTIONS: Other: lifting heavy stuff and cannot remember other stuff, does not want him to over do stuff, cut back on some of the stuff he is doing at the house. He mentions bed rest but also that the doctor told him to come to physical therapy. Repeatedly hospitalized for complications with his heart (most recently afib)   PATIENT GOALS: like get everything loosened up like it was when he left PT last time, and try to get the pain and tightness to quit.   NEXT MD VISIT: MRI on 05/23/2024 followed by a phone call after the results are in.  OBJECTIVE  Vitals:   07/14/24 1048  BP: (!) 150/76  Pulse: 64  SpO2: 98%   OBJECTIVE  SELF-REPORTED  FUNCTION Neck Disability Index (NDI): 52% (range 0-100%)  Patient Specific Functional Scale (PSFS)  Lifting stuff: 3/10 Climbing Stairs: 2/10 Holding things without dropping them: 2/10 Average: 2.3/10   SPINE MOTION CERVICAL SPINE AROM *Indicates pain Flexion: 25  Extension: 0 head feels like a bowling ball Side Flexion:       R 7 pain/stiffness base of neck      L 10 pain/stiffness base of neck Rotation:  R 30 R sided tightness at lower neck and L tightness at upper neck L 35 L sided tightness at lower neck Protrusion: 1 inches Retraction: 0 inches   NEUROLOGICAL Deep Tendon Reflexes:  Biceps (C5/6): R = 3+, L = 2+ Triceps (C7): R = 0+, L = 2+ Wrist Extensors (C6): R = 1+, L = 2+ Pronator (C7): R = 0+, L = 2+ Finger flexors (C8): R = 2+, L = 2+   MUSCLE PERFORMANCE (MYOTOME/MMT):  *Indicates pain 05/19/24 07/14/24 Date  Joint/Motion R/L R/L R/L  Shoulder        Flexion / / /  Abduction (C5) / / /  External rotation (C5) 4+/4 4+/4+ /  Internal rotation / / /  Extension / / /  Periscapular        Upper Trap / / /  Middle Trap / / /  Lower Trap / / /  Elbow        Flexion (C6) 4+/4 4+/4- /  Extension (C7) 3/4 3/4 /  Wrist        Flexion (C7) / / /  Extension (C6) / / /  Radial deviation / / /  Ulnar deviation (C8) / / /  Pronation / / /  Supination / / /  Hand        MCP joint ext (C7) 3/3+ 3/4     Thumb extension (C8) 4/3 4+/4+ /  5th finger abduction  (T1) 4+/4+ 4+/4+ /  Finger adduction (C8)  4+/4+ /  Comments:  05/19/24: R elbow extension 4+/5 in supine. Left elbow flexion not noticeably stronger in supine. L elbow flexion a little stronger after 10x10 seconds on/off cervical distraction.    TREATMENT                                                                                                                            Manual therapy: to reduce pain and tissue tension, improve range of motion, neuromodulation, in order to promote improved ability to  complete functional activities. HOOKLYING Intermittent manual cervical spine traction 10x10 seconds on/off Decreased feeling of tension in base of neck and left hand numbness slightly   PATIENT EDUCATION:  Education details: Update to POC, need for re-evaluation Person educated: Patient Education method: Explanation, Demonstration, Tactile cues, and Verbal cues Education comprehension: verbalized understanding, returned demonstration, and needs further education  HOME EXERCISE PROGRAM: Access Code: [email or web address] URL: [email or web address] Date: 05/21/2024 Prepared by: Camie Cleverly  Exercises - Sidelying Shoulder External Rotation  - 1-2 x daily - 2 sets - 10 reps - 5 seconds hold - Sidelying Open Book  - 1-2 x daily - 2 sets - 10 reps - 1 or more breaths hold - Lat Pull with band/cable  - 1-2 x  daily - 2 sets - 10 reps - 5 seconds hold  ASSESSMENT:  CLINICAL IMPRESSION: Patient Re-Evaluated today due to returning to PT following NSTEMI s/p PCI/DES LAD on 06/08/2024 and cleared to return to PT on 06/23/24 by cardiology. Attempted PT re-evaluation on 06/25/24 but unable to proceed with session due to systolic BP elevated above 180 mmHg. Today vitals were within acceptable limits for PT. Patient has not been participating in HEP since NSTEMI and his baselines have not changed much since initial evaluation. He does have improved cervical spine rotation to the right but other ROM and myotomes have not significantly changed (except L shoulder ER is now equal to R). Patient is a 58 y.o. male referred to outpatient physical therapy with a medical diagnosis of  s/p cervical spinal fusion, neck pain, cervicalgia who continues to present with signs and symptoms consistent with chronic neck pain, neck and thoracic spine stiffness, postural changes, and bilateral radiculopathy.  Patient has a lot of stiffness in muscles and joints in the regions surrounding his C2-T2 fusion that appear to be  leading to a lot of his discomfort and functional difficulty. He also has hypomobility in his upper cervical spine and thoracic spine, and would benefit from improved motion here to help compensate for the joint stiffness at the fused levels. Patient presents with significant pain, joint stiffness, posture, nerve stretch sensitivity, load sensitivity, ROM, muscle performance (strength/power/endurance), paresthesia, motor control, knowledge, and activity tolerance impairments that are limiting ability to complete difficulty with usual activities such as using his tractor, being active, personal care, lifting, reading, concentration, work, driving, sleeping, reaching, holding and manipulating things such as a cup and utinsils with hands, eating, shopping, carrying groceries, and recreation without difficulty. Patient will benefit from continued skilled physical therapy intervention to address current body structure impairments and activity limitations to improve function and work towards goals set in current POC in order to return to prior level of function or maximal functional improvement.    OBJECTIVE IMPAIRMENTS: cardiopulmonary status limiting activity, decreased activity tolerance, decreased coordination, decreased endurance, decreased knowledge of condition, decreased ROM, decreased strength, hypomobility, increased edema, increased fascial restrictions, increased muscle spasms, impaired flexibility, impaired sensation, impaired UE functional use, improper body mechanics, postural dysfunction, obesity, and pain.   ACTIVITY LIMITATIONS: carrying, lifting, bending, sitting, bed mobility, reach over head, hygiene/grooming, and caring for others and   PARTICIPATION LIMITATIONS: meal prep, cleaning, laundry, driving, shopping, community activity, occupation, yard work, and  difficulty with usual activities such as using his tractor, being active, personal care, lifting, reading, concentration, work, driving,  sleeping, reaching, holding and manipulating things such as a cup and utinsils with hands, eating, shopping, carrying groceries, and recreation  PERSONAL FACTORS: Behavior pattern, Fitness, Past/current experiences, Time since onset of injury/illness/exacerbation, and 3+ comorbidities:  Cervical myelopathy (HCC); Osteoarthritis of spine with radiculopathy, lumbar region; Hyperlipidemia associated with type 2 diabetes mellitus (HCC); Diabetes mellitus (HCC); Idiopathic chronic gout of multiple sites without tophus; Essential hypertension; Dermatitis; Class 2 severe obesity with serious comorbidity and body mass index (BMI) of 39.0 to 39.9 in adult [facility]); Chronic kidney disease (CKD) stage G2/A3, mildly decreased glomerular filtration rate (GFR) between 60-89 mL/min/1.73 square meter and albuminuria creatinine ratio greater than 300 mg/g; Proteinuria; Polyp of ascending colon; Rectal polyp; Other chronic pain; Systolic murmur; Tobacco use; S/P cervical spinal fusion; Former heavy tobacco smoker; Gout; Grade I diastolic dysfunction; LVH (left ventricular hypertrophy); Accelerated hypertension; Acute on chronic diastolic congestive heart failure (HCC); Class 2 obesity; Uncontrolled  diabetes mellitus with hyperglycemia, with long-term current use of insulin  (HCC); Acute on chronic heart failure with preserved ejection fraction (HFpEF) (HCC); Hypertensive emergency; Acute respiratory failure with hypoxia (HCC); Elevated troponin; Pleuritic chest pain; Type 2 diabetes mellitus with peripheral neuropathy (HCC); Gastroesophageal reflux disease without esophagitis; COPD with asthma (HCC); Dyslipidemia; Unstable angina (HCC); Acute on chronic diastolic heart failure (HCC); Myocardial injury; Type II diabetes mellitus with renal manifestations (HCC); Obesity (BMI 30-39.9); HLD (hyperlipidemia); Chronic diastolic CHF (congestive heart failure) (HCC); Diarrhea; CAD (coronary artery disease); CKD (chronic kidney disease) stage  2, GFR 60-89 ml/min; Degenerative disc disease, cervical; Osteoarthritis of spine with radiculopathy, cervical region; History of colon polyps; COPD exacerbation (HCC); CHF (congestive heart failure) (HCC); Acute pulmonary edema (HCC); Nicotine  dependence; Class 3 obesity; Acute on chronic diastolic (congestive) heart failure (HCC); Chronic kidney disease, stage 3a (HCC); and Stable angina (HCC) on their problem list. he  has a past medical history of Bulging of cervical intervertebral disc, CAD (coronary artery disease), Cervical myelopathy (HCC), Chronic heart failure with preserved ejection fraction (HFpEF) (HCC), CKD (chronic kidney disease), stage III (HCC), COPD (chronic obstructive pulmonary disease) (HCC), Gout, Hyperlipidemia LDL goal <70, Hypertension, Morbid obesity (HCC), OSA (obstructive sleep apnea), Stress due to family tension (04/15/2019), and Tracheobronchomalacia. he  has a past surgical history that includes Cholecystectomy; Colonoscopy with propofol  (N/A, 03/10/2020); RIGHT/LEFT HEART CATH AND CORONARY ANGIOGRAPHY (N/A, 05/29/2023); CORONARY PRESSURE/FFR STUDY (N/A, 05/29/2023); Cardiac catheterization; Back surgery; Colonoscopy with propofol  (N/A, 09/25/2023); polypectomy (09/25/2023); and RIGHT/LEFT HEART CATH AND CORONARY ANGIOGRAPHY (Bilateral, 10/30/2023) are also affecting patient's functional outcome.   REHAB POTENTIAL: Fair due to severity and complexity of condition and past experiences. Good rehab potential to improve to where he was at last PT discharge.   CLINICAL DECISION MAKING: Evolving/moderate complexity  EVALUATION COMPLEXITY: Moderate   GOALS: Goals reviewed with patient? No  SHORT TERM GOALS: Target date: 06/02/2024  Patient will be independent with initial home exercise program for self-management of symptoms. Baseline: Initial HEP provided at IE (05/19/24); Goal status: MET   LONG TERM GOALS: Target date: 08/11/2024. Target date updated to 10/06/2024 for  all unmet goal son 07/14/2024.   Patient will be independent with a long-term home exercise program for self-management of symptoms.  Baseline: Initial HEP provided at IE (05/19/24); has not been performing since his heart attack (07/14/2024);  Goal status: Ongoing  2.  Patient will demonstrate improved Neck Disability Index (NDI) to equal or less than 10% to demonstrate improvement in overall condition and self-reported functional ability.  Baseline: 58% (05/19/24); 52% (07/14/2024);  Goal status: Ongoing  3.  Patient will demonstrate increased UE myotomes by equal or greater than 1/2 MMT or up to 4+/5 to improve his ability to use his B UE for riding the tractor and completing household activities.  Baseline: as low as 3/5  (05/19/24); similar as low as 3/5 (07/14/24);  Goal status: Ongoing  4.  Patient will demonstrate improved cervical spine rotation to 40 degrees bilaterally to maximize his use of the AA ROM and view his surroundings more easily.  Baseline: R/L 15/34 (05/19/24); R/L 30/35 (07/14/24);  Goal status: In progress  5.  Patient will demonstrate improvement in Patient Specific Functional Scale (PSFS) of equal or greater than 8/10 points to reflect clinically significant improvement in patient's most valued functional activities. Baseline: to be measured at visit 2 as appropriate (05/19/24); 3.3/10 (05/21/2024); 2.3/10 (07/14/2024);  Goal status: Ongoing    PLAN:  PT FREQUENCY: 2x/week  PT DURATION: 8-12  weeks  PLANNED INTERVENTIONS: 97164- PT Re-evaluation, 97750- Physical Performance Testing, 97110-Therapeutic exercises, 97530- Therapeutic activity, 97112- Neuromuscular re-education, [phone]- Self Care, 02859- Manual therapy, [phone]- Aquatic Therapy, [phone]- Electrical stimulation (unattended), [phone] (1-2 muscles), 20561 (3+ muscles)- Dry Needling, Patient/Family education, Joint mobilization, Spinal mobilization, Cryotherapy, and Moist heat  PLAN FOR NEXT SESSION: update HEP as  appropriate, educate on mechanical stressors and go through day to help with education on alleviation of these for the short term, exercise to improve intrascapular activation and strength for more balanced force couple and decrease over-reliance on UT, interventions to improve thoracic and AA/OA mobility. education   Camie SAUNDERS. Juli, PT, DPT, Cert. MDT 07/14/24, 1:13 PM  [facility] [address] [place], [place] [address] P: [phone] I F: [phone]

## 2024-07-15 ENCOUNTER — Other Ambulatory Visit: Payer: Self-pay

## 2024-07-15 NOTE — Progress Notes (Signed)
 Complex Care Management Note Care Guide Note  07/15/2024 Name: Cory Rivas MRN: [id number] DOB: November 06, 1965   Complex Care Management Outreach Attempts: A second unsuccessful outreach was attempted today to offer the patient with information about available complex care management services.  Follow Up Plan:  Additional outreach attempts will be made to offer the patient complex care management information and services.   Encounter Outcome:  No Answer  Harlene Satterfield  [facility]  [facility] Guide  Direct Dial : [phone]  Fax [phone]

## 2024-07-16 ENCOUNTER — Encounter: Payer: Self-pay | Admitting: Physical Therapy

## 2024-07-16 ENCOUNTER — Ambulatory Visit: Admitting: Physical Therapy

## 2024-07-16 VITALS — BP 146/78 | HR 60

## 2024-07-16 DIAGNOSIS — M5412 Radiculopathy, cervical region: Secondary | ICD-10-CM | POA: Insufficient documentation

## 2024-07-16 DIAGNOSIS — M6281 Muscle weakness (generalized): Secondary | ICD-10-CM | POA: Insufficient documentation

## 2024-07-16 DIAGNOSIS — M542 Cervicalgia: Secondary | ICD-10-CM | POA: Diagnosis not present

## 2024-07-16 DIAGNOSIS — R293 Abnormal posture: Secondary | ICD-10-CM | POA: Diagnosis not present

## 2024-07-16 DIAGNOSIS — M62838 Other muscle spasm: Secondary | ICD-10-CM | POA: Insufficient documentation

## 2024-07-16 NOTE — Therapy (Signed)
 OUTPATIENT PHYSICAL THERAPY TREATMENT   Patient Name: BRANDI ARMATO MRN: [id number] DOB:Dec 15, 1965, 58 y.o., male Today's Date: 07/16/2024  END OF SESSION:  PT End of Session - 07/16/24 1037     Visit Number 7    Number of Visits 17    Date for Recertification  10/06/24    Authorization Type [place] MEDICAID HEALTHY BLUE reporting period from 05/19/2024    Authorization Time Period HB [id number] for 13 PT vsts from 8/7-11/5    Authorization - Visit Number 6    Authorization - Number of Visits 13    Progress Note Due on Visit 10    PT Start Time 1033    PT Stop Time 1116    PT Time Calculation (min) 43 min    Activity Tolerance Patient tolerated treatment well    Behavior During Therapy [facility] for tasks assessed/performed                Past Medical History:  Diagnosis Date   Bulging of cervical intervertebral disc    CAD (coronary artery disease)    a. 09/2020 MV: low risk; b. 05/2023 Cath: Moderate LAD dzs w/ seq 50-60% stenoses and nl RFR 0.93. Mod OM dzs->Med rx; b. 10/2023 Cath: LM min irregs, LAD 55ost, 55p/m, LCX large nl, OM1 50, RCA mod, nl.   Cervical myelopathy (HCC)    Chronic heart failure with preserved ejection fraction (HFpEF) (HCC)    a. 05/2023 Echo: EF 50-55%, no rwma, GrIII DD, nl RV fxn, mild MR, Ao sclerosis; b. 10/2023 Echo: EF 55-60%, no rwma, nl RV fxn, mild MR, Ao sclerosis.   CKD (chronic kidney disease), stage III (HCC)    COPD (chronic obstructive pulmonary disease) (HCC)    Diabetes mellitus without complication (HCC)    Gout    Hyperlipidemia LDL goal <70    Hypertension    Morbid obesity (HCC)    OSA (obstructive sleep apnea)    Stress due to family tension 04/15/2019   Tracheobronchomalacia    Past Surgical History:  Procedure Laterality Date   BACK SURGERY     CARDIAC CATHETERIZATION     CHOLECYSTECTOMY     COLONOSCOPY WITH PROPOFOL  N/A 03/10/2020   Procedure: COLONOSCOPY WITH PROPOFOL ;  Surgeon: Jinny Carmine, MD;  Location: [facility]  ENDOSCOPY;  Service: Endoscopy;  Laterality: N/A;   COLONOSCOPY WITH PROPOFOL  N/A 09/25/2023   Procedure: COLONOSCOPY WITH PROPOFOL ;  Surgeon: Jinny Carmine, MD;  Location: [facility] ENDOSCOPY;  Service: Endoscopy;  Laterality: N/A;   CORONARY IMAGING/OCT N/A 06/08/2024   Procedure: CORONARY IMAGING/OCT;  Surgeon: Darron Deatrice LABOR, MD;  Location: [facility] INVASIVE CV LAB;  Service: Cardiovascular;  Laterality: N/A;   CORONARY PRESSURE/FFR STUDY N/A 05/29/2023   Procedure: CORONARY PRESSURE/FFR STUDY;  Surgeon: Mady Bruckner, MD;  Location: [facility] INVASIVE CV LAB;  Service: Cardiovascular;  Laterality: N/A;   CORONARY STENT INTERVENTION N/A 06/08/2024   Procedure: CORONARY STENT INTERVENTION;  Surgeon: Darron Deatrice LABOR, MD;  Location: [facility] INVASIVE CV LAB;  Service: Cardiovascular;  Laterality: N/A;   LEFT HEART CATH AND CORONARY ANGIOGRAPHY N/A 06/08/2024   Procedure: LEFT HEART CATH AND CORONARY ANGIOGRAPHY;  Surgeon: Darron Deatrice LABOR, MD;  Location: [facility] INVASIVE CV LAB;  Service: Cardiovascular;  Laterality: N/A;   POLYPECTOMY  09/25/2023   Procedure: POLYPECTOMY;  Surgeon: Jinny Carmine, MD;  Location: [facility] ENDOSCOPY;  Service: Endoscopy;;   RIGHT/LEFT HEART CATH AND CORONARY ANGIOGRAPHY N/A 05/29/2023   Procedure: RIGHT/LEFT HEART CATH AND CORONARY ANGIOGRAPHY;  Surgeon: Mady Bruckner, MD;  Location: [facility] INVASIVE CV LAB;  Service: Cardiovascular;  Laterality: N/A;   RIGHT/LEFT HEART CATH AND CORONARY ANGIOGRAPHY Bilateral 10/30/2023   Procedure: RIGHT/LEFT HEART CATH AND CORONARY ANGIOGRAPHY;  Surgeon: Mady Bruckner, MD;  Location: [facility] INVASIVE CV LAB;  Service: Cardiovascular;  Laterality: Bilateral;   Patient Active Problem List   Diagnosis Date Noted   Chest pain 06/08/2024   Hypertensive urgency 06/08/2024   Non-ST elevation (NSTEMI) myocardial infarction (HCC) 06/08/2024   Chronic kidney disease, stage 3a (HCC) 05/17/2024   Stable angina 05/17/2024   Acute on chronic diastolic  (congestive) heart failure (HCC) 04/03/2024   Class 3 obesity (HCC) 02/18/2024   Nicotine  dependence 01/23/2024   Acute pulmonary edema (HCC) 01/22/2024   CHF (congestive heart failure) (HCC) 01/20/2024   COPD exacerbation (HCC) 10/21/2023   History of colon polyps 09/25/2023   Myocardial injury 08/19/2023   Type II diabetes mellitus with renal manifestations (HCC) 08/19/2023   Obesity (BMI 30-39.9) 08/19/2023   HLD (hyperlipidemia) 08/19/2023   Chronic diastolic CHF (congestive heart failure) (HCC) 08/19/2023   Diarrhea 08/19/2023   CAD (coronary artery disease) 08/19/2023   CKD (chronic kidney disease) stage 2, GFR 60-89 ml/min 08/19/2023   Acute on chronic diastolic heart failure (HCC) 05/30/2023   Unstable angina (HCC) 05/29/2023   Pleuritic chest pain 05/27/2023   Type 2 diabetes mellitus with peripheral neuropathy (HCC) 05/27/2023   Gastroesophageal reflux disease without esophagitis 05/27/2023   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   Dyslipidemia 05/27/2023   Hypertensive emergency 11/22/2022   Acute respiratory failure with hypoxia (HCC) 11/22/2022   Elevated troponin 11/22/2022   Chronic heart failure with preserved ejection fraction (HFpEF) (HCC) 08/24/2022   Acute on chronic diastolic congestive heart failure (HCC) 08/23/2022   Class 2 obesity 08/23/2022   Uncontrolled diabetes mellitus with hyperglycemia, with long-term current use of insulin  (HCC) 08/23/2022   Grade I diastolic dysfunction 06/08/2022   LVH (left ventricular hypertrophy) 06/08/2022   Accelerated hypertension 06/08/2022   S/P cervical spinal fusion 08/18/2021   Former heavy tobacco smoker 08/18/2021   Gout 08/18/2021   Other chronic pain 10/26/2020   Systolic murmur 10/26/2020   Tobacco use 10/26/2020   Polyp of ascending colon    Rectal polyp    Class 2 severe obesity with serious comorbidity and body mass index (BMI) of 39.0 to 39.9 in adult 04/15/2019   Chronic kidney disease (CKD) stage G2/A3,  mildly decreased glomerular filtration rate (GFR) between 60-89 mL/min/1.73 square meter and albuminuria creatinine ratio greater than 300 mg/g 04/15/2019   Proteinuria 04/15/2019   Hyperlipidemia associated with type 2 diabetes mellitus (HCC) 03/18/2019   Diabetes mellitus (HCC) 03/18/2019   Idiopathic chronic gout of multiple sites without tophus 03/18/2019   Essential hypertension 03/18/2019   Dermatitis 03/18/2019   Cervical myelopathy (HCC) 11/22/2015   Osteoarthritis of spine with radiculopathy, lumbar region 11/22/2015   Degenerative disc disease, cervical 11/22/2015   Osteoarthritis of spine with radiculopathy, cervical region 11/22/2015    PCP: Leavy Mole, PA-C  REFERRING PROVIDER: Corrothers, Duwaine Norris, NP (neurosurgery)  REFERRING DIAG:  s/p cervical spinal fusion, neck pain, cervicalgia  THERAPY DIAG:  Cervicalgia  Radiculopathy, cervical region  Muscle weakness (generalized)  Other muscle spasm  Abnormal posture  Rationale for Evaluation and Treatment: Rehabilitation  ONSET DATE: chronic over many years but underwent C2-6 posterior laminectomy with C2-T2 instrumentation and fusion with Dr. Salina in April 2022   SUBJECTIVE:  PERTINENT HISTORY:  Patient is a 58 y.o. male who presents to outpatient physical therapy with a referral for medical diagnosis  s/p cervical spinal fusion, neck pain, cervicalgia. This patient's chief complaints consist of chronic neck pain and stiffness, UE paresthesia, weakness, and incoordination leading to the following functional deficits: difficulty with usual activities such as using his tractor, being active, personal care, lifting, reading, concentration, work, driving, sleeping, reaching, holding and manipulating things such as a cup  and utinsils with hands, eating, shopping, carrying groceries, and recreation. Relevant past medical history and comorbidities include the following: he has NSTEMI s/p PCI/DES LAD on 06/08/2024; Lumbar degenerative disc disease; Cervical myelopathy (HCC); Osteoarthritis of spine with radiculopathy, lumbar region; Hyperlipidemia associated with type 2 diabetes mellitus (HCC); Diabetes mellitus (HCC); Idiopathic chronic gout of multiple sites without tophus; Essential hypertension; Dermatitis; Class 2 severe obesity with serious comorbidity and body mass index (BMI) of 39.0 to 39.9 in adult [facility]); Chronic kidney disease (CKD) stage G2/A3, mildly decreased glomerular filtration rate (GFR) between 60-89 mL/min/1.73 square meter and albuminuria creatinine ratio greater than 300 mg/g; Proteinuria; Polyp of ascending colon; Rectal polyp; Other chronic pain; Systolic murmur; Tobacco use; S/P cervical spinal fusion; Former heavy tobacco smoker; Gout; Grade I diastolic dysfunction; LVH (left ventricular hypertrophy); Accelerated hypertension; Acute on chronic diastolic congestive heart failure (HCC); Class 2 obesity; Uncontrolled diabetes mellitus with hyperglycemia, with long-term current use of insulin  (HCC); Acute on chronic heart failure with preserved ejection fraction (HFpEF) (HCC); Hypertensive emergency; Acute respiratory failure with hypoxia (HCC); Elevated troponin; Pleuritic chest pain; Type 2 diabetes mellitus with peripheral neuropathy (HCC); Gastroesophageal reflux disease without esophagitis; COPD with asthma (HCC); Dyslipidemia; Unstable angina (HCC); Acute on chronic diastolic heart failure (HCC); Myocardial injury; Type II diabetes mellitus with renal manifestations (HCC); Obesity (BMI 30-39.9); HLD (hyperlipidemia); Chronic diastolic CHF (congestive heart failure) (HCC); Diarrhea; CAD (coronary artery disease); CKD (chronic kidney disease) stage 2, GFR 60-89 ml/min; Degenerative disc disease, cervical;  Osteoarthritis of spine with radiculopathy, cervical region; History of colon polyps; COPD exacerbation (HCC); CHF (congestive heart failure) (HCC); Acute pulmonary edema (HCC); Nicotine  dependence; Class 3 obesity; Acute on chronic diastolic (congestive) heart failure (HCC); Chronic kidney disease, stage 3a (HCC); and Stable angina (HCC) on their problem list. he  has a past medical history of Bulging of cervical intervertebral disc, CAD (coronary artery disease), Cervical myelopathy (HCC), Chronic heart failure with preserved ejection fraction (HFpEF) (HCC), CKD (chronic kidney disease), stage III (HCC), COPD (chronic obstructive pulmonary disease) (HCC), Gout, Hyperlipidemia LDL goal <70, Hypertension, Morbid obesity (HCC), OSA (obstructive sleep apnea), Stress due to family tension (04/15/2019), and Tracheobronchomalacia. he  has a past surgical history that includes Cholecystectomy; Colonoscopy with propofol  (N/A, 03/10/2020); RIGHT/LEFT HEART CATH AND CORONARY ANGIOGRAPHY (N/A, 05/29/2023); CORONARY PRESSURE/FFR STUDY (N/A, 05/29/2023); Cardiac catheterization; Back surgery; Colonoscopy with propofol  (N/A, 09/25/2023); polypectomy (09/25/2023); and RIGHT/LEFT HEART CATH AND CORONARY ANGIOGRAPHY (Bilateral, 10/30/2023). Patient denies hx of cancer, stroke, seizures, unexplained weight loss, unexplained changes in bowel or bladder problems, and osteoporosis  Exercise history: no weights at home.   SUBJECTIVE STATEMENT: Patient states his neck is feeling a little stiff. R hand 4th and 5th digits numb. He states he is wheezy today and did not sleep well last night. He states his neck feels pretty good since he has not done much today yet.   NSTEMI s/p PCI/DES LAD on 06/08/2024  Top three problems list: hand grip/holding things, tightness in B UT (driving, walking, sitting, UBE)  PAIN: NPRS: Current: 3-4/10  in upper thoracic spine and base of neck radiating to shoulders  From initial eval 05/19/24:   Best: 2/10, Worst: 10/10. Pain location: back of neck down to just past the scapulae, bilateral upper traps to tops of shoulders, sore over the clavicles and throat to touch. Numbness and tingling bilateral hands (R > L). Numbness in the R hand 2nd and 3rd digits all the time, but can be all the fingers of the R hand. He gets shooting down the left arm to the middle finger and increased numbness spreading from that finger.  Pain description: I can feel where they cut me, muscle spasm (vibrating), choking, cramp Aggravating factors: bending, lifting, raking, driving for a long distance Relieving factors: stay active, getting out to do things he usually does   PRECAUTIONS: Other: lifting heavy stuff and cannot remember other stuff, does not want him to over do stuff, cut back on some of the stuff he is doing at the house. He mentions bed rest but also that the doctor told him to come to physical therapy. Repeatedly hospitalized for complications with his heart (most recently afib)   PATIENT GOALS: like get everything loosened up like it was when he left PT last time, and try to get the pain and tightness to quit.   NEXT MD VISIT: MRI on 05/23/2024 followed by a phone call after the results are in.  OBJECTIVE  Vitals:   07/16/24 1037  BP: (!) 146/78  Pulse: 60  SpO2: 97%    TREATMENT                                                                                                                            Manual therapy: to reduce pain and tissue tension, improve range of motion, neuromodulation, in order to promote improved ability to complete functional activities. PRONE CPA with KE wedge grade IV stretch at each thoracic segment from T4 to T10 to improve segmental mobliity  Therapeutic exercise: therapeutic exercises that incorporate ONE parameter at one or more areas of the body to centralize symptoms, develop strength and endurance, range of motion, and flexibility required for  successful completion of functional activities.  Vitals measurement for safety (see above)  Sidelying knees to chest thoracic rotation with cervical spine rotation with hand on chest, actively retracting scapula for active stretch of thoracic spine.  2x90 seconds each side rotating with exhale Needed physical assist to get knees towards chest at first Paired with quadruped exercise  Therapeutic activities: dynamic therapeutic activities incorporating MULTIPLE parameters or areas of the body designed to achieve improved functional performance.  Quadruped thoracic rotation with hand behind lumbar spine to improve functional rotation strength and work into the range of motion gained with manual and ROM exercise above.  2x90 seconds each side with exhale upon rotation towards the ceiling Needed pillows under knees, limited by knee pain to one side at a time (needed a different exercise between sets, so paired with sidelying  thoracic rotation exercise).   Standing interscapular activation/training with back against wall to strengthen new motor patterns and postural muscles to improve functional activities:  Isometric scapular retraction/depression with back of hands pressing into wall at sides (anatomical position) for improved mid trap recruitment/endurance while working on decreasing UT 1x90 seconds with 5 second holds      PATIENT EDUCATION:  Education details: Update to POC, need for re-evaluation Person educated: Patient Education method: Explanation, Demonstration, Tactile cues, and Verbal cues Education comprehension: verbalized understanding, returned demonstration, and needs further education  HOME EXERCISE PROGRAM: Access Code: [email or web address] URL: [email or web address] Date: 07/16/2024 Prepared by: Camie Cleverly  Exercises - Sidelying Open Book  - 1 x daily - 1 sets - 10 reps - 1 breath hold - 90 seconds time practicing per set - Quadruped Thoracic Rotation with Hand on  Low Back  - 1 x daily - 1 sets - 10 reps - 1 breath hold - 90 seconds time practicing per set - Standing Anatomical Position with Scapular Retraction and Depression at Wall  - 1 x daily - 1 sets - 10 reps - 5 hold - 90 seconds time practicing per set - Standing Anatomical Position with Scapular Retraction and Depression at Wall  - 1 x daily - 1 sets - 10 reps - 5 hold - 90 seconds time practicing per set  ASSESSMENT:  CLINICAL IMPRESSION: Patient's BP within acceptable limits today. Session focused on interventions to improve thoracic mobility and functional range/strength to decrease pressure at fused segments of his cervical and upper thoracic spine. Patient had difficulty with quadruped due to L knee pain, but this was mitigated with using a pillow under it and breaking up the exercise sets in this position. He reported feeling better by end of session. He has a lot of stiffness throughout the thoracic spine and low endurance for functional strengthening exercises. Patient would benefit from continued management of limiting condition by skilled physical therapist to address remaining impairments and functional limitations to work towards stated goals and return to PLOF or maximal functional independence.     OBJECTIVE IMPAIRMENTS: cardiopulmonary status limiting activity, decreased activity tolerance, decreased coordination, decreased endurance, decreased knowledge of condition, decreased ROM, decreased strength, hypomobility, increased edema, increased fascial restrictions, increased muscle spasms, impaired flexibility, impaired sensation, impaired UE functional use, improper body mechanics, postural dysfunction, obesity, and pain.   ACTIVITY LIMITATIONS: carrying, lifting, bending, sitting, bed mobility, reach over head, hygiene/grooming, and caring for others and   PARTICIPATION LIMITATIONS: meal prep, cleaning, laundry, driving, shopping, community activity, occupation, yard work, and   difficulty with usual activities such as using his tractor, being active, personal care, lifting, reading, concentration, work, driving, sleeping, reaching, holding and manipulating things such as a cup and utinsils with hands, eating, shopping, carrying groceries, and recreation  PERSONAL FACTORS: Behavior pattern, Fitness, Past/current experiences, Time since onset of injury/illness/exacerbation, and 3+ comorbidities:  Cervical myelopathy (HCC); Osteoarthritis of spine with radiculopathy, lumbar region; Hyperlipidemia associated with type 2 diabetes mellitus (HCC); Diabetes mellitus (HCC); Idiopathic chronic gout of multiple sites without tophus; Essential hypertension; Dermatitis; Class 2 severe obesity with serious comorbidity and body mass index (BMI) of 39.0 to 39.9 in adult [facility]); Chronic kidney disease (CKD) stage G2/A3, mildly decreased glomerular filtration rate (GFR) between 60-89 mL/min/1.73 square meter and albuminuria creatinine ratio greater than 300 mg/g; Proteinuria; Polyp of ascending colon; Rectal polyp; Other chronic pain; Systolic murmur; Tobacco use; S/P cervical spinal fusion; Former heavy tobacco smoker; Gout; Grade I diastolic dysfunction; LVH (  left ventricular hypertrophy); Accelerated hypertension; Acute on chronic diastolic congestive heart failure (HCC); Class 2 obesity; Uncontrolled diabetes mellitus with hyperglycemia, with long-term current use of insulin  (HCC); Acute on chronic heart failure with preserved ejection fraction (HFpEF) (HCC); Hypertensive emergency; Acute respiratory failure with hypoxia (HCC); Elevated troponin; Pleuritic chest pain; Type 2 diabetes mellitus with peripheral neuropathy (HCC); Gastroesophageal reflux disease without esophagitis; COPD with asthma (HCC); Dyslipidemia; Unstable angina (HCC); Acute on chronic diastolic heart failure (HCC); Myocardial injury; Type II diabetes mellitus with renal manifestations (HCC); Obesity (BMI 30-39.9); HLD  (hyperlipidemia); Chronic diastolic CHF (congestive heart failure) (HCC); Diarrhea; CAD (coronary artery disease); CKD (chronic kidney disease) stage 2, GFR 60-89 ml/min; Degenerative disc disease, cervical; Osteoarthritis of spine with radiculopathy, cervical region; History of colon polyps; COPD exacerbation (HCC); CHF (congestive heart failure) (HCC); Acute pulmonary edema (HCC); Nicotine  dependence; Class 3 obesity; Acute on chronic diastolic (congestive) heart failure (HCC); Chronic kidney disease, stage 3a (HCC); and Stable angina (HCC) on their problem list. he  has a past medical history of Bulging of cervical intervertebral disc, CAD (coronary artery disease), Cervical myelopathy (HCC), Chronic heart failure with preserved ejection fraction (HFpEF) (HCC), CKD (chronic kidney disease), stage III (HCC), COPD (chronic obstructive pulmonary disease) (HCC), Gout, Hyperlipidemia LDL goal <70, Hypertension, Morbid obesity (HCC), OSA (obstructive sleep apnea), Stress due to family tension (04/15/2019), and Tracheobronchomalacia. he  has a past surgical history that includes Cholecystectomy; Colonoscopy with propofol  (N/A, 03/10/2020); RIGHT/LEFT HEART CATH AND CORONARY ANGIOGRAPHY (N/A, 05/29/2023); CORONARY PRESSURE/FFR STUDY (N/A, 05/29/2023); Cardiac catheterization; Back surgery; Colonoscopy with propofol  (N/A, 09/25/2023); polypectomy (09/25/2023); and RIGHT/LEFT HEART CATH AND CORONARY ANGIOGRAPHY (Bilateral, 10/30/2023) are also affecting patient's functional outcome.   REHAB POTENTIAL: Fair due to severity and complexity of condition and past experiences. Good rehab potential to improve to where he was at last PT discharge.   CLINICAL DECISION MAKING: Evolving/moderate complexity  EVALUATION COMPLEXITY: Moderate   GOALS: Goals reviewed with patient? No  SHORT TERM GOALS: Target date: 06/02/2024  Patient will be independent with initial home exercise program for self-management of  symptoms. Baseline: Initial HEP provided at IE (05/19/24); Goal status: MET   LONG TERM GOALS: Target date: 08/11/2024. Target date updated to 10/06/2024 for all unmet goal son 07/14/2024.   Patient will be independent with a long-term home exercise program for self-management of symptoms.  Baseline: Initial HEP provided at IE (05/19/24); has not been performing since his heart attack (07/14/2024);  Goal status: Ongoing  2.  Patient will demonstrate improved Neck Disability Index (NDI) to equal or less than 10% to demonstrate improvement in overall condition and self-reported functional ability.  Baseline: 58% (05/19/24); 52% (07/14/2024);  Goal status: Ongoing  3.  Patient will demonstrate increased UE myotomes by equal or greater than 1/2 MMT or up to 4+/5 to improve his ability to use his B UE for riding the tractor and completing household activities.  Baseline: as low as 3/5  (05/19/24); similar as low as 3/5 (07/14/24);  Goal status: Ongoing  4.  Patient will demonstrate improved cervical spine rotation to 40 degrees bilaterally to maximize his use of the AA ROM and view his surroundings more easily.  Baseline: R/L 15/34 (05/19/24); R/L 30/35 (07/14/24);  Goal status: In progress  5.  Patient will demonstrate improvement in Patient Specific Functional Scale (PSFS) of equal or greater than 8/10 points to reflect clinically significant improvement in patient's most valued functional activities. Baseline: to be measured at visit 2 as appropriate (05/19/24); 3.3/10 (05/21/2024); 2.3/10 (  07/14/2024);  Goal status: Ongoing    PLAN:  PT FREQUENCY: 2x/week  PT DURATION: 8-12 weeks  PLANNED INTERVENTIONS: 97164- PT Re-evaluation, 97750- Physical Performance Testing, 97110-Therapeutic exercises, 97530- Therapeutic activity, 97112- Neuromuscular re-education, 97535- Self Care, [id number]- Manual therapy, [phone]- Aquatic Therapy, [phone]- Electrical stimulation (unattended), [phone] (1-2 muscles), 20561  (3+ muscles)- Dry Needling, Patient/Family education, Joint mobilization, Spinal mobilization, Cryotherapy, and Moist heat  PLAN FOR NEXT SESSION: update HEP as appropriate, educate on mechanical stressors and go through day to help with education on alleviation of these for the short term, exercise to improve intrascapular activation and strength for more balanced force couple and decrease over-reliance on UT, interventions to improve thoracic and AA/OA mobility. education   Camie SAUNDERS. Juli, PT, DPT, Cert. MDT 07/16/24, 12:23 PM  [facility] [address] [place], [place] [address] P: [phone] I F: [phone]

## 2024-07-17 ENCOUNTER — Ambulatory Visit: Admitting: "Endocrinology

## 2024-07-17 ENCOUNTER — Other Ambulatory Visit: Payer: Self-pay

## 2024-07-17 ENCOUNTER — Encounter: Payer: Self-pay | Admitting: "Endocrinology

## 2024-07-17 VITALS — BP 116/70 | HR 61 | Resp 16 | Ht 67.0 in | Wt 219.8 lb

## 2024-07-17 DIAGNOSIS — E782 Mixed hyperlipidemia: Secondary | ICD-10-CM

## 2024-07-17 DIAGNOSIS — Z794 Long term (current) use of insulin: Secondary | ICD-10-CM | POA: Diagnosis not present

## 2024-07-17 DIAGNOSIS — Z7984 Long term (current) use of oral hypoglycemic drugs: Secondary | ICD-10-CM | POA: Diagnosis not present

## 2024-07-17 DIAGNOSIS — E1165 Type 2 diabetes mellitus with hyperglycemia: Secondary | ICD-10-CM | POA: Diagnosis not present

## 2024-07-17 DIAGNOSIS — Z7985 Long-term (current) use of injectable non-insulin antidiabetic drugs: Secondary | ICD-10-CM | POA: Diagnosis not present

## 2024-07-17 MED ORDER — BAQSIMI ONE PACK 3 MG/DOSE NA POWD
1.0000 | NASAL | 3 refills | Status: AC | PRN
  Filled 2024-07-17: qty 2, 2d supply, fill #0

## 2024-07-17 NOTE — Patient Instructions (Signed)

## 2024-07-17 NOTE — Progress Notes (Signed)
 Outpatient Endocrinology Note Cory Birmingham, MD  07/17/24   Cory Rivas [birth year] [id number]  Referring Provider: Leavy Mole, PA-C Primary Care Provider: Leavy Mole, PA-C Reason for consultation: Subjective   Assessment & Plan  Diagnoses and all orders for this visit:  Uncontrolled type 2 diabetes mellitus with hyperglycemia (HCC) -     Ambulatory referral to diabetic education  Long term (current) use of oral hypoglycemic drugs  Long-term (current) use of injectable non-insulin  antidiabetic drugs  Long-term insulin  use (HCC)  Mixed hypercholesterolemia and hypertriglyceridemia  Other orders -     Glucagon  (BAQSIMI ONE PACK) 3 MG/DOSE POWD; Place 1 Device into the nose as needed (Low blood sugar with impaired consciousness).   Diabetes Type II complicated by neuropathy, MI s/p stent, No results found for: GFR Hba1c goal less than 7, current Hba1c is  Lab Results  Component Value Date   HGBA1C 9.6 (H) 06/07/2024   Will recommend the following: Lantuss 20-25 units at bedtime Humalog  8 units tid 15 min before meals  Trulicity  3 mg/week Jardiance  10 mg every day  No history of MEN syndrome/medullary thyroid cancer/pancreatitis or pancreatic cancer in self or family No known contraindications/side effects to any of above medications Glucagon  discussed and prescribed with refills on 07/17/24, has glucagon  inj  -Last LD and Tg are as follows: Lab Results  Component Value Date   LDLCALC 101 (H) 06/08/2024    Lab Results  Component Value Date   TRIG 208 (H) 06/08/2024   -On rosuvastatin  40 mg every day, repatha  140mg  q2 weeks, zetia  10 mg every day  -Follow low fat diet and exercise   -Blood pressure goal <140/90 - Microalbumin/creatinine goal is < 30 -Last MA/Cr is as follows: Lab Results  Component Value Date   MICROALBUR 37.3 05/29/2024   -on ACE/ARB valsartan  80mg  every day  -diet changes including salt restriction -limit eating  outside -counseled BP targets per standards of diabetes care -uncontrolled blood pressure can lead to retinopathy, nephropathy and cardiovascular and atherosclerotic heart disease  Reviewed and counseled on: -A1C target -Blood sugar targets -Complications of uncontrolled diabetes  -Checking blood sugar before meals and bedtime and bring log next visit -All medications with mechanism of action and side effects -Hypoglycemia management: rule of 15's, Glucagon  Emergency Kit and medical alert ID -low-carb low-fat plate-method diet -At least 20 minutes of physical activity per day -Annual dilated retinal eye exam and foot exam -compliance and follow up needs -follow up as scheduled or earlier if problem gets worse  Call if blood sugar is less than 70 or consistently above 250    Take a 15 gm snack of carbohydrate at bedtime before you go to sleep if your blood sugar is less than 100.    If you are going to fast after midnight for a test or procedure, ask your physician for instructions on how to reduce/decrease your insulin  dose.    Call if blood sugar is less than 70 or consistently above 250  -Treating a low sugar by rule of 15  (15 gms of sugar every 15 min until sugar is more than 70) If you feel your sugar is low, test your sugar to be sure If your sugar is low (less than 70), then take 15 grams of a fast acting Carbohydrate (3-4 glucose tablets or glucose gel or 4 ounces of juice or regular soda) Recheck your sugar 15 min after treating low to make sure it is more than 70 If sugar is  still less than 70, treat again with 15 grams of carbohydrate          Don't drive the hour of hypoglycemia  If unconscious/unable to eat or drink by mouth, use glucagon  injection or nasal spray baqsimi and call 911. Can repeat again in 15 min if still unconscious.  Return in about 6 weeks (around 08/28/2024).   I have reviewed current medications, nurse's notes, allergies, vital signs, past medical  and surgical history, family medical history, and social history for this encounter. Counseled patient on symptoms, examination findings, lab findings, imaging results, treatment decisions and monitoring and prognosis. The patient understood the recommendations and agrees with the treatment plan. All questions regarding treatment plan were fully answered.  Cory Birmingham, MD  07/17/24    History of Present Illness Cory Rivas is a 58 y.o. year old male who presents for evaluation of Type II diabetes mellitus.  Cory Rivas was first diagnosed around 2019.  Diabetes education +  Home diabetes regimen: Lantuss 20-25 units at bedtime Humalog  8 units tid right before meals  Trulicity  3mg /week Jardiance  10 mg every day  COMPLICATIONS +  MI s/p stent/ - Stroke -  retinopathy +  neuropathy +  nephropathy  SYMPTOMS REVIEWED + Polyuria + Weight loss - Blurred vision  BLOOD SUGAR DATA Did not bring meter Reports checking 4 times a day: recalled range: 120-250  Physical Exam  BP 116/70   Pulse 61   Resp 16   Ht 5' 7 (1.702 m)   Wt 219 lb 12.8 oz (99.7 kg)   SpO2 98%   BMI 34.43 kg/m    Constitutional: well developed, well nourished Head: normocephalic, atraumatic Eyes: sclera anicteric, no redness Neck: supple Lungs: normal respiratory effort Neurology: alert and oriented Skin: dry, no appreciable rashes Musculoskeletal: no appreciable defects Psychiatric: normal mood and affect Diabetic Foot Exam - Simple   No data filed      Current Medications Patient's Medications  New Prescriptions   GLUCAGON  (BAQSIMI ONE PACK) 3 MG/DOSE POWD    Place 1 Device into the nose as needed (Low blood sugar with impaired consciousness).  Previous Medications   ALLOPURINOL  (ZYLOPRIM ) 100 MG TABLET    Take 1 tablet (100 mg total) by mouth daily with the 300mg  tablet for a total daily dose of 400mg .   ALLOPURINOL  (ZYLOPRIM ) 300 MG TABLET    Take 1 tablet (300 mg total) by mouth  daily as needed (Gout flare). Home med.   AMLODIPINE  (NORVASC ) 10 MG TABLET    Take 1 tablet (10 mg total) by mouth daily.   ASPIRIN  EC 81 MG TABLET    Take 1 tablet (81 mg total) by mouth daily. Swallow whole.   BACLOFEN  (LIORESAL ) 10 MG TABLET    Take 1 tablet (10 mg total) by mouth 3 (three) times daily as needed for muscle spasms.   BISOPROLOL  (ZEBETA ) 5 MG TABLET    Take 2 tablets (10 mg total) by mouth every morning AND 1 tablet (5 mg total) at bedtime.   BUDESONIDE -FORMOTEROL  (SYMBICORT ) 160-4.5 MCG/ACT INHALER    Inhale 2 puffs into the lungs 2 (two) times daily.   CETIRIZINE  (ZYRTEC ) 10 MG TABLET    Take 1 tablet (10 mg total) by mouth daily.   CONTINUOUS GLUCOSE SENSOR (FREESTYLE LIBRE 3 SENSOR) MISC    Place 1 sensor onto skin every 14 (fourteen) days to monitor blood sugar continuously   CROMOLYN  (OPTICROM ) 4 % OPHTHALMIC SOLUTION    Place 1 drop  into both eyes 4 (four) times daily as needed.   DULAGLUTIDE  (TRULICITY ) 3 MG/0.5ML SOAJ    Inject 3 mg into the skin once a week.   DULOXETINE  (CYMBALTA ) 30 MG CAPSULE    Take 2 capsules (60 mg total) by mouth every morning AND 1 capsule (30 mg total) every evening.   EMPAGLIFLOZIN  (JARDIANCE ) 10 MG TABS TABLET    Take 1 tablet (10 mg total) by mouth daily.   EVOLOCUMAB  (REPATHA  SURECLICK) 140 MG/ML SOAJ    Inject 140 mg into the skin every 14 (fourteen) days.   EZETIMIBE  (ZETIA ) 10 MG TABLET    Take 1 tablet (10 mg total) by mouth daily.   FLUTICASONE  (FLONASE ) 50 MCG/ACT NASAL SPRAY    Place 1 spray into both nostrils daily.   INSULIN  GLARGINE (LANTUS  SOLOSTAR) 100 UNIT/ML SOLOSTAR PEN    Inject 20-25 Units into the skin at bedtime.   INSULIN  LISPRO (HUMALOG ) 100 UNIT/ML KWIKPEN    Inject 10 Units into the skin 3 (three) times daily. Plus sliding scale up per instructions   IPRATROPIUM-ALBUTEROL  (DUONEB) 0.5-2.5 (3) MG/3ML SOLN    Take 3 mLs by nebulization every 6 (six) hours as needed.   LEVOCETIRIZINE (XYZAL ) 5 MG TABLET    Take 1 tablet  (5 mg total) by mouth every evening.   NICOTINE  POLACRILEX (NICOTINE  MINI) 2 MG LOZENGE    Take 1 lozenge (2 mg total) by mouth every 2 (two) hours as needed for smoking cessation.   PANTOPRAZOLE  (PROTONIX ) 40 MG TABLET    Take 1 tablet (40 mg total) by mouth daily.   PRASUGREL  (EFFIENT ) 10 MG TABS TABLET    Take 1 tablet (10 mg total) by mouth daily.   PREGABALIN  (LYRICA ) 50 MG CAPSULE    Take 1 capsule (50 mg total) by mouth 2 (two) times daily as needed. Home med.   RANOLAZINE  (RANEXA ) 500 MG 12 HR TABLET    Take 1 tablet (500 mg total) by mouth 2 (two) times daily.   ROSUVASTATIN  (CRESTOR ) 40 MG TABLET    Take 1 tablet (40 mg total) by mouth daily.   SPIRONOLACTONE  (ALDACTONE ) 25 MG TABLET    Take 1 tablet (25 mg total) by mouth daily.   SUCRALFATE  (CARAFATE ) 1 G TABLET    Take 1 tablet (1 g total) by mouth 4 (four) times daily -  with meals and at bedtime.   TIOTROPIUM BROMIDE  MONOHYDRATE (SPIRIVA  RESPIMAT) 2.5 MCG/ACT AERS    Inhale 2 puffs into the lungs daily.   TORSEMIDE  (DEMADEX ) 20 MG TABLET    Take 2 tablets (40 mg total) by mouth daily.   VALSARTAN  (DIOVAN ) 80 MG TABLET    Take 1 tablet (80 mg total) by mouth daily.   VENTOLIN  HFA 108 (90 BASE) MCG/ACT INHALER    Inhale 2 puffs into the lungs every 6 (six) hours as needed for wheezing or shortness of breath.  Modified Medications   No medications on file  Discontinued Medications   AMOXICILLIN -CLAVULANATE (AUGMENTIN ) 875-125 MG TABLET    Take 1 tablet by mouth 2 (two) times daily for 10 days.   BISOPROLOL  (ZEBETA ) 5 MG TABLET    Take 2 tablets (10 mg total) by mouth daily.   NICOTINE  (NICODERM CQ  - DOSED IN MG/24 HOURS) 21 MG/24HR PATCH    Place 1 patch (21 mg total) onto the skin daily.    Allergies Allergies  Allergen Reactions   Oxycodone  Nausea And Vomiting   Entresto  [Sacubitril -Valsartan ] Nausea Only and Other (See  Comments)    Lightheaded and dizzy    Onion Nausea And Vomiting   Nitroglycerin  Itching and Rash    IV  product, patient says SL tablet is fine    Past Medical History Past Medical History:  Diagnosis Date   Bulging of cervical intervertebral disc    CAD (coronary artery disease)    a. 09/2020 MV: low risk; b. 05/2023 Cath: Moderate LAD dzs w/ seq 50-60% stenoses and nl RFR 0.93. Mod OM dzs->Med rx; b. 10/2023 Cath: LM min irregs, LAD 55ost, 55p/m, LCX large nl, OM1 50, RCA mod, nl.   Cervical myelopathy (HCC)    Chronic heart failure with preserved ejection fraction (HFpEF) (HCC)    a. 05/2023 Echo: EF 50-55%, no rwma, GrIII DD, nl RV fxn, mild MR, Ao sclerosis; b. 10/2023 Echo: EF 55-60%, no rwma, nl RV fxn, mild MR, Ao sclerosis.   CKD (chronic kidney disease), stage III (HCC)    COPD (chronic obstructive pulmonary disease) (HCC)    Diabetes mellitus without complication (HCC)    Gout    Hyperlipidemia LDL goal <70    Hypertension    Morbid obesity (HCC)    OSA (obstructive sleep apnea)    Stress due to family tension 04/15/2019   Tracheobronchomalacia     Past Surgical History Past Surgical History:  Procedure Laterality Date   BACK SURGERY     CARDIAC CATHETERIZATION     CHOLECYSTECTOMY     COLONOSCOPY WITH PROPOFOL  N/A 03/10/2020   Procedure: COLONOSCOPY WITH PROPOFOL ;  Surgeon: Jinny Carmine, MD;  Location: [facility];  Service: Endoscopy;  Laterality: N/A;   COLONOSCOPY WITH PROPOFOL  N/A 09/25/2023   Procedure: COLONOSCOPY WITH PROPOFOL ;  Surgeon: Jinny Carmine, MD;  Location: [facility];  Service: Endoscopy;  Laterality: N/A;   CORONARY IMAGING/OCT N/A 06/08/2024   Procedure: CORONARY IMAGING/OCT;  Surgeon: Darron Deatrice LABOR, MD;  Location: [facility];  Service: Cardiovascular;  Laterality: N/A;   CORONARY PRESSURE/FFR STUDY N/A 05/29/2023   Procedure: CORONARY PRESSURE/FFR STUDY;  Surgeon: Mady Bruckner, MD;  Location: [facility];  Service: Cardiovascular;  Laterality: N/A;   CORONARY STENT INTERVENTION N/A 06/08/2024   Procedure: CORONARY STENT  INTERVENTION;  Surgeon: Darron Deatrice LABOR, MD;  Location: [facility];  Service: Cardiovascular;  Laterality: N/A;   LEFT HEART CATH AND CORONARY ANGIOGRAPHY N/A 06/08/2024   Procedure: LEFT HEART CATH AND CORONARY ANGIOGRAPHY;  Surgeon: Darron Deatrice LABOR, MD;  Location: [facility];  Service: Cardiovascular;  Laterality: N/A;   POLYPECTOMY  09/25/2023   Procedure: POLYPECTOMY;  Surgeon: Jinny Carmine, MD;  Location: [facility];  Service: Endoscopy;;   RIGHT/LEFT HEART CATH AND CORONARY ANGIOGRAPHY N/A 05/29/2023   Procedure: RIGHT/LEFT HEART CATH AND CORONARY ANGIOGRAPHY;  Surgeon: Mady Bruckner, MD;  Location: [facility];  Service: Cardiovascular;  Laterality: N/A;   RIGHT/LEFT HEART CATH AND CORONARY ANGIOGRAPHY Bilateral 10/30/2023   Procedure: RIGHT/LEFT HEART CATH AND CORONARY ANGIOGRAPHY;  Surgeon: Mady Bruckner, MD;  Location: [facility];  Service: Cardiovascular;  Laterality: Bilateral;    Family History family history includes AAA (abdominal aortic aneurysm) in his maternal grandmother; Diabetes in his maternal grandfather and mother; Heart attack in his maternal grandmother and mother; Heart disease in his mother; Hyperlipidemia in his mother; Hypertension in his father and mother; Lung cancer in his father; Stroke in his father.  Social History Social History   Socioeconomic History   Marital status: Divorced    Spouse name: Not  on file   Number of children: 0   Years of education: 9   Highest education level: 9th grade  Occupational History   Occupation: disability  Tobacco Use   Smoking status: Every Day    Current packs/day: 0.50    Average packs/day: 1 pack/day for 38.3 years (37.7 ttl pk-yrs)    Types: Cigarettes    Start date: 10/19/1989    Last attempt to quit: 08/20/2022   Smokeless tobacco: Never   Tobacco comments:    30+ years hx smoking 2 ppd, stopped smoking briefly in April 2022 for a surgery and then restarted  smoking.  As of August 2025, he smoking about 2 to 4 cigarettes a day.  Vaping Use   Vaping status: Never Used  Substance and Sexual Activity   Alcohol use: No   Drug use: No   Sexual activity: Not Currently    Partners: Female  Other Topics Concern   Not on file  Social History Narrative   Lives with sister [name], brother in law [name]), and his mother - Kyshon Tolliver   Does not routinely exercise.   Social Drivers of Health   Financial Resource Strain: High Risk (01/21/2024)   Overall Financial Resource Strain (CARDIA)    Difficulty of Paying Living Expenses: Hard  Food Insecurity: No Food Insecurity (04/04/2024)   Hunger Vital Sign    Worried About Running Out of Food in the Last Year: Never true    Ran Out of Food in the Last Year: Never true  Transportation Needs: No Transportation Needs (04/04/2024)   PRAPARE - Administrator, Civil Service (Medical): No    Lack of Transportation (Non-Medical): No  Physical Activity: Insufficiently Active (12/06/2022)   Exercise Vital Sign    Days of Exercise per Week: 3 days    Minutes of Exercise per Session: 10 min  Stress: No Stress Concern Present (12/06/2022)   [employer] of Occupational Health - Occupational Stress Questionnaire    Feeling of Stress : Only a little  Social Connections: Socially Isolated (02/17/2024)   Social Connection and Isolation Panel    Frequency of Communication with Friends and Family: More than three times a week    Frequency of Social Gatherings with Friends and Family: Twice a week    Attends Religious Services: Never    Database administrator or Organizations: No    Attends Banker Meetings: Never    Marital Status: Divorced  Catering manager Violence: Not At Risk (04/04/2024)   Humiliation, Afraid, Rape, and Kick questionnaire    Fear of Current or Ex-Partner: No    Emotionally Abused: No    Physically Abused: No    Sexually Abused: No    Lab Results  Component Value Date    HGBA1C 9.6 (H) 06/07/2024   HGBA1C 7.9 (A) 11/28/2023   HGBA1C 9.8 (H) 08/09/2023   Lab Results  Component Value Date   CHOL 181 06/08/2024   Lab Results  Component Value Date   HDL 38 (L) 06/08/2024   Lab Results  Component Value Date   LDLCALC 101 (H) 06/08/2024   Lab Results  Component Value Date   TRIG 208 (H) 06/08/2024   Lab Results  Component Value Date   CHOLHDL 4.8 06/08/2024   Lab Results  Component Value Date   CREATININE 1.69 (H) 07/07/2024   No results found for: GFR Lab Results  Component Value Date   MICROALBUR 37.3 05/29/2024      Component  Value Date/Time   NA 137 07/07/2024 1824   NA 138 03/17/2024 1030   K 3.4 (L) 07/07/2024 1824   CL 100 07/07/2024 1824   CO2 27 07/07/2024 1824   GLUCOSE 244 (H) 07/07/2024 1824   BUN 20 07/07/2024 1824   BUN 12 03/17/2024 1030   CREATININE 1.69 (H) 07/07/2024 1824   CREATININE 1.30 02/10/2024 1053   CALCIUM  8.9 07/07/2024 1824   PROT 7.9 07/07/2024 1824   PROT 6.9 11/15/2023 0943   ALBUMIN 4.2 07/07/2024 1824   ALBUMIN 4.2 11/15/2023 0943   AST 19 07/07/2024 1824   ALT 18 07/07/2024 1824   ALKPHOS 92 07/07/2024 1824   BILITOT 1.1 07/07/2024 1824   BILITOT 0.4 11/15/2023 0943   GFRNONAA 47 (L) 07/07/2024 1824   GFRNONAA 71 09/30/2020 1019   GFRAA 83 09/30/2020 1019      Latest Ref Rng & Units 07/07/2024    6:24 PM 06/09/2024    5:11 AM 06/08/2024    6:33 PM  BMP  Glucose 70 - 99 mg/dL 755  862    BUN 6 - 20 mg/dL 20  11    Creatinine 9.38 - 1.24 mg/dL 8.30  8.62  8.80   Sodium 135 - 145 mmol/L 137  137    Potassium 3.5 - 5.1 mmol/L 3.4  4.0    Chloride 98 - 111 mmol/L 100  104    CO2 22 - 32 mmol/L 27  24    Calcium  8.9 - 10.3 mg/dL 8.9  8.6         Component Value Date/Time   WBC 12.0 (H) 07/07/2024 1824   RBC 5.50 07/07/2024 1824   HGB 16.1 07/07/2024 1824   HGB 17.2 10/28/2023 1139   HCT 46.6 07/07/2024 1824   HCT 51.6 (H) 10/28/2023 1139   PLT 226 07/07/2024 1824   PLT 198  10/28/2023 1139   MCV 84.7 07/07/2024 1824   MCV 87 10/28/2023 1139   MCH 29.3 07/07/2024 1824   MCHC 34.5 07/07/2024 1824   RDW 13.1 07/07/2024 1824   RDW 13.3 10/28/2023 1139   LYMPHSABS 1.2 04/12/2024 1438   LYMPHSABS 2.1 08/01/2022 1057   MONOABS 0.4 04/12/2024 1438   EOSABS 0.1 04/12/2024 1438   EOSABS 0.1 08/01/2022 1057   BASOSABS 0.0 04/12/2024 1438   BASOSABS 0.0 08/01/2022 1057     Parts of this note may have been dictated using voice recognition software. There may be variances in spelling and vocabulary which are unintentional. Not all errors are proofread. Please notify the dino if any discrepancies are noted or if the meaning of any statement is not clear.

## 2024-07-20 ENCOUNTER — Telehealth: Payer: Self-pay | Admitting: Family

## 2024-07-20 NOTE — Telephone Encounter (Signed)
 Called to confirm/remind patient of [facility].   Appointment:   [] Confirmed  [x] Left mess   [] No answer/No voice mail  [] VM Full/unable to leave message  [] Phone not in service  Patient reminded to bring all medications and/or complete list.  Confirmed patient has transportation. Gave directions, instructed to utilize valet parking.

## 2024-07-20 NOTE — Progress Notes (Signed)
 Complex Care Management Note Care Guide Note  07/20/2024 Name: Cory Rivas MRN: [id number] DOB: 1966-09-18   Complex Care Management Outreach Attempts: A third unsuccessful outreach was attempted today to offer the patient with information about available complex care management services.  Follow Up Plan:  No further outreach attempts will be made at this time. We have been unable to contact the patient to offer or enroll patient in complex care management services.  Encounter Outcome:  No Answer  Harlene Satterfield  [facility]  [facility], [facility] Guide  Direct Dial : [phone]  Fax [phone]

## 2024-07-21 ENCOUNTER — Ambulatory Visit: Admitting: Physical Therapy

## 2024-07-21 ENCOUNTER — Other Ambulatory Visit: Payer: Self-pay

## 2024-07-21 ENCOUNTER — Encounter: Payer: Self-pay | Admitting: Family

## 2024-07-21 ENCOUNTER — Ambulatory Visit: Admitting: Family

## 2024-07-21 VITALS — BP 160/80 | HR 65 | Wt 225.0 lb

## 2024-07-21 DIAGNOSIS — I272 Pulmonary hypertension, unspecified: Secondary | ICD-10-CM | POA: Insufficient documentation

## 2024-07-21 DIAGNOSIS — I428 Other cardiomyopathies: Secondary | ICD-10-CM | POA: Diagnosis not present

## 2024-07-21 DIAGNOSIS — Z7984 Long term (current) use of oral hypoglycemic drugs: Secondary | ICD-10-CM | POA: Insufficient documentation

## 2024-07-21 DIAGNOSIS — R9431 Abnormal electrocardiogram [ECG] [EKG]: Secondary | ICD-10-CM | POA: Insufficient documentation

## 2024-07-21 DIAGNOSIS — Z833 Family history of diabetes mellitus: Secondary | ICD-10-CM | POA: Insufficient documentation

## 2024-07-21 DIAGNOSIS — E119 Type 2 diabetes mellitus without complications: Secondary | ICD-10-CM | POA: Diagnosis not present

## 2024-07-21 DIAGNOSIS — E782 Mixed hyperlipidemia: Secondary | ICD-10-CM

## 2024-07-21 DIAGNOSIS — J4489 Other specified chronic obstructive pulmonary disease: Secondary | ICD-10-CM | POA: Insufficient documentation

## 2024-07-21 DIAGNOSIS — M62838 Other muscle spasm: Secondary | ICD-10-CM | POA: Diagnosis not present

## 2024-07-21 DIAGNOSIS — Z79899 Other long term (current) drug therapy: Secondary | ICD-10-CM | POA: Insufficient documentation

## 2024-07-21 DIAGNOSIS — E785 Hyperlipidemia, unspecified: Secondary | ICD-10-CM | POA: Insufficient documentation

## 2024-07-21 DIAGNOSIS — M542 Cervicalgia: Secondary | ICD-10-CM | POA: Diagnosis not present

## 2024-07-21 DIAGNOSIS — Z8249 Family history of ischemic heart disease and other diseases of the circulatory system: Secondary | ICD-10-CM | POA: Insufficient documentation

## 2024-07-21 DIAGNOSIS — Z7985 Long-term (current) use of injectable non-insulin antidiabetic drugs: Secondary | ICD-10-CM | POA: Insufficient documentation

## 2024-07-21 DIAGNOSIS — F17210 Nicotine dependence, cigarettes, uncomplicated: Secondary | ICD-10-CM | POA: Insufficient documentation

## 2024-07-21 DIAGNOSIS — G4733 Obstructive sleep apnea (adult) (pediatric): Secondary | ICD-10-CM | POA: Insufficient documentation

## 2024-07-21 DIAGNOSIS — Z59868 Other specified financial insecurity: Secondary | ICD-10-CM | POA: Insufficient documentation

## 2024-07-21 DIAGNOSIS — M6281 Muscle weakness (generalized): Secondary | ICD-10-CM | POA: Diagnosis not present

## 2024-07-21 DIAGNOSIS — E1122 Type 2 diabetes mellitus with diabetic chronic kidney disease: Secondary | ICD-10-CM | POA: Insufficient documentation

## 2024-07-21 DIAGNOSIS — J45909 Unspecified asthma, uncomplicated: Secondary | ICD-10-CM

## 2024-07-21 DIAGNOSIS — N183 Chronic kidney disease, stage 3 unspecified: Secondary | ICD-10-CM | POA: Insufficient documentation

## 2024-07-21 DIAGNOSIS — R079 Chest pain, unspecified: Secondary | ICD-10-CM | POA: Diagnosis not present

## 2024-07-21 DIAGNOSIS — R293 Abnormal posture: Secondary | ICD-10-CM | POA: Diagnosis not present

## 2024-07-21 DIAGNOSIS — I130 Hypertensive heart and chronic kidney disease with heart failure and stage 1 through stage 4 chronic kidney disease, or unspecified chronic kidney disease: Secondary | ICD-10-CM | POA: Insufficient documentation

## 2024-07-21 DIAGNOSIS — M5412 Radiculopathy, cervical region: Secondary | ICD-10-CM | POA: Diagnosis not present

## 2024-07-21 DIAGNOSIS — Z794 Long term (current) use of insulin: Secondary | ICD-10-CM | POA: Insufficient documentation

## 2024-07-21 DIAGNOSIS — I10 Essential (primary) hypertension: Secondary | ICD-10-CM

## 2024-07-21 DIAGNOSIS — I5032 Chronic diastolic (congestive) heart failure: Secondary | ICD-10-CM | POA: Insufficient documentation

## 2024-07-21 DIAGNOSIS — I2510 Atherosclerotic heart disease of native coronary artery without angina pectoris: Secondary | ICD-10-CM | POA: Diagnosis not present

## 2024-07-21 MED ORDER — HYDRALAZINE HCL 25 MG PO TABS
25.0000 mg | ORAL_TABLET | Freq: Two times a day (BID) | ORAL | 5 refills | Status: AC
  Filled 2024-07-21: qty 60, 30d supply, fill #0
  Filled 2024-08-25: qty 60, 30d supply, fill #1
  Filled 2024-09-25: qty 60, 30d supply, fill #2
  Filled 2024-11-09 – 2024-11-13 (×2): qty 60, 30d supply, fill #3

## 2024-07-21 NOTE — Therapy (Signed)
 OUTPATIENT PHYSICAL THERAPY TREATMENT   Patient Name: Cory Rivas MRN: [id number] DOB:Feb 20, 1966, 58 y.o., male Today's Date: 07/21/2024  END OF SESSION:  PT End of Session - 07/21/24 1040     Visit Number 8    Number of Visits 17    Date for Recertification  10/06/24    Authorization Type [place] MEDICAID [employer] BLUE reporting period from 05/19/2024    Authorization Time Period HB auth#[id number] for 13 PT vsts from 8/7-11/5    Authorization - Visit Number 8    Authorization - Number of Visits 13    Progress Note Due on Visit 10    PT Start Time 1030    PT Stop Time 1115    PT Time Calculation (min) 45 min    Activity Tolerance Patient tolerated treatment well    Behavior During Therapy [facility] for tasks assessed/performed                 Past Medical History:  Diagnosis Date   Bulging of cervical intervertebral disc    CAD (coronary artery disease)    a. 09/2020 MV: low risk; b. 05/2023 Cath: Moderate LAD dzs w/ seq 50-60% stenoses and nl RFR 0.93. Mod OM dzs->Med rx; b. 10/2023 Cath: LM min irregs, LAD 55ost, 55p/m, LCX large nl, OM1 50, RCA mod, nl.   Cervical myelopathy (HCC)    Chronic heart failure with preserved ejection fraction (HFpEF) (HCC)    a. 05/2023 Echo: EF 50-55%, no rwma, GrIII DD, nl RV fxn, mild MR, Ao sclerosis; b. 10/2023 Echo: EF 55-60%, no rwma, nl RV fxn, mild MR, Ao sclerosis.   CKD (chronic kidney disease), stage III (HCC)    COPD (chronic obstructive pulmonary disease) (HCC)    Diabetes mellitus without complication (HCC)    Gout    Hyperlipidemia LDL goal <70    Hypertension    Morbid obesity (HCC)    OSA (obstructive sleep apnea)    Stress due to family tension 04/15/2019   Tracheobronchomalacia    Past Surgical History:  Procedure Laterality Date   BACK SURGERY     CARDIAC CATHETERIZATION     CHOLECYSTECTOMY     COLONOSCOPY WITH PROPOFOL  N/A 03/10/2020   Procedure: COLONOSCOPY WITH PROPOFOL ;  Surgeon: Jinny Carmine, MD;  Location: [facility]  ENDOSCOPY;  Service: Endoscopy;  Laterality: N/A;   COLONOSCOPY WITH PROPOFOL  N/A 09/25/2023   Procedure: COLONOSCOPY WITH PROPOFOL ;  Surgeon: Jinny Carmine, MD;  Location: [facility] ENDOSCOPY;  Service: Endoscopy;  Laterality: N/A;   CORONARY IMAGING/OCT N/A 06/08/2024   Procedure: CORONARY IMAGING/OCT;  Surgeon: Darron Deatrice LABOR, MD;  Location: [facility] INVASIVE CV LAB;  Service: Cardiovascular;  Laterality: N/A;   CORONARY PRESSURE/FFR STUDY N/A 05/29/2023   Procedure: CORONARY PRESSURE/FFR STUDY;  Surgeon: Mady Bruckner, MD;  Location: [facility] INVASIVE CV LAB;  Service: Cardiovascular;  Laterality: N/A;   CORONARY STENT INTERVENTION N/A 06/08/2024   Procedure: CORONARY STENT INTERVENTION;  Surgeon: Darron Deatrice LABOR, MD;  Location: [facility] INVASIVE CV LAB;  Service: Cardiovascular;  Laterality: N/A;   LEFT HEART CATH AND CORONARY ANGIOGRAPHY N/A 06/08/2024   Procedure: LEFT HEART CATH AND CORONARY ANGIOGRAPHY;  Surgeon: Darron Deatrice LABOR, MD;  Location: [facility] INVASIVE CV LAB;  Service: Cardiovascular;  Laterality: N/A;   POLYPECTOMY  09/25/2023   Procedure: POLYPECTOMY;  Surgeon: Jinny Carmine, MD;  Location: [facility] ENDOSCOPY;  Service: Endoscopy;;   RIGHT/LEFT HEART CATH AND CORONARY ANGIOGRAPHY N/A 05/29/2023   Procedure: RIGHT/LEFT HEART CATH AND CORONARY ANGIOGRAPHY;  Surgeon: Mady Bruckner, MD;  Location: [facility] INVASIVE CV LAB;  Service: Cardiovascular;  Laterality: N/A;   RIGHT/LEFT HEART CATH AND CORONARY ANGIOGRAPHY Bilateral 10/30/2023   Procedure: RIGHT/LEFT HEART CATH AND CORONARY ANGIOGRAPHY;  Surgeon: Mady Bruckner, MD;  Location: [facility] INVASIVE CV LAB;  Service: Cardiovascular;  Laterality: Bilateral;   Patient Active Problem List   Diagnosis Date Noted   Chest pain 06/08/2024   Hypertensive urgency 06/08/2024   Non-ST elevation (NSTEMI) myocardial infarction (HCC) 06/08/2024   Chronic kidney disease, stage 3a (HCC) 05/17/2024   Stable angina 05/17/2024   Acute on chronic diastolic  (congestive) heart failure (HCC) 04/03/2024   Class 3 obesity (HCC) 02/18/2024   Nicotine  dependence 01/23/2024   Acute pulmonary edema (HCC) 01/22/2024   CHF (congestive heart failure) (HCC) 01/20/2024   COPD exacerbation (HCC) 10/21/2023   History of colon polyps 09/25/2023   Myocardial injury 08/19/2023   Type II diabetes mellitus with renal manifestations (HCC) 08/19/2023   Obesity (BMI 30-39.9) 08/19/2023   HLD (hyperlipidemia) 08/19/2023   Chronic diastolic CHF (congestive heart failure) (HCC) 08/19/2023   Diarrhea 08/19/2023   CAD (coronary artery disease) 08/19/2023   CKD (chronic kidney disease) stage 2, GFR 60-89 ml/min 08/19/2023   Acute on chronic diastolic heart failure (HCC) 05/30/2023   Unstable angina (HCC) 05/29/2023   Pleuritic chest pain 05/27/2023   Type 2 diabetes mellitus with peripheral neuropathy (HCC) 05/27/2023   Gastroesophageal reflux disease without esophagitis 05/27/2023   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   Dyslipidemia 05/27/2023   Hypertensive emergency 11/22/2022   Acute respiratory failure with hypoxia (HCC) 11/22/2022   Elevated troponin 11/22/2022   Chronic heart failure with preserved ejection fraction (HFpEF) (HCC) 08/24/2022   Acute on chronic diastolic congestive heart failure (HCC) 08/23/2022   Class 2 obesity 08/23/2022   Uncontrolled diabetes mellitus with hyperglycemia, with long-term current use of insulin  (HCC) 08/23/2022   Grade I diastolic dysfunction 06/08/2022   LVH (left ventricular hypertrophy) 06/08/2022   Accelerated hypertension 06/08/2022   S/P cervical spinal fusion 08/18/2021   Former heavy tobacco smoker 08/18/2021   Gout 08/18/2021   Other chronic pain 10/26/2020   Systolic murmur 10/26/2020   Tobacco use 10/26/2020   Polyp of ascending colon    Rectal polyp    Class 2 severe obesity with serious comorbidity and body mass index (BMI) of 39.0 to 39.9 in adult 04/15/2019   Chronic kidney disease (CKD) stage G2/A3,  mildly decreased glomerular filtration rate (GFR) between 60-89 mL/min/1.73 square meter and albuminuria creatinine ratio greater than 300 mg/g 04/15/2019   Proteinuria 04/15/2019   Hyperlipidemia associated with type 2 diabetes mellitus (HCC) 03/18/2019   Diabetes mellitus (HCC) 03/18/2019   Idiopathic chronic gout of multiple sites without tophus 03/18/2019   Essential hypertension 03/18/2019   Dermatitis 03/18/2019   Cervical myelopathy (HCC) 11/22/2015   Osteoarthritis of spine with radiculopathy, lumbar region 11/22/2015   Degenerative disc disease, cervical 11/22/2015   Osteoarthritis of spine with radiculopathy, cervical region 11/22/2015    PCP: Leavy Mole, PA-C  REFERRING PROVIDER: Corrothers, Duwaine Norris, NP (neurosurgery)  REFERRING DIAG:  s/p cervical spinal fusion, neck pain, cervicalgia  THERAPY DIAG:  Cervicalgia  Radiculopathy, cervical region  Rationale for Evaluation and Treatment: Rehabilitation  ONSET DATE: chronic over many years but underwent C2-6 posterior laminectomy with C2-T2 instrumentation and fusion with Dr. Salina in April 2022   SUBJECTIVE:  PERTINENT HISTORY:  Patient is a 58 y.o. male who presents to outpatient physical therapy with a referral for medical diagnosis  s/p cervical spinal fusion, neck pain, cervicalgia. This patient's chief complaints consist of chronic neck pain and stiffness, UE paresthesia, weakness, and incoordination leading to the following functional deficits: difficulty with usual activities such as using his tractor, being active, personal care, lifting, reading, concentration, work, driving, sleeping, reaching, holding and manipulating things such as a cup and utinsils with hands, eating, shopping, carrying groceries, and  recreation. Relevant past medical history and comorbidities include the following: he has NSTEMI s/p PCI/DES LAD on 06/08/2024; Lumbar degenerative disc disease; Cervical myelopathy (HCC); Osteoarthritis of spine with radiculopathy, lumbar region; Hyperlipidemia associated with type 2 diabetes mellitus (HCC); Diabetes mellitus (HCC); Idiopathic chronic gout of multiple sites without tophus; Essential hypertension; Dermatitis; Class 2 severe obesity with serious comorbidity and body mass index (BMI) of 39.0 to 39.9 in adult [facility]); Chronic kidney disease (CKD) stage G2/A3, mildly decreased glomerular filtration rate (GFR) between 60-89 mL/min/1.73 square meter and albuminuria creatinine ratio greater than 300 mg/g; Proteinuria; Polyp of ascending colon; Rectal polyp; Other chronic pain; Systolic murmur; Tobacco use; S/P cervical spinal fusion; Former heavy tobacco smoker; Gout; Grade I diastolic dysfunction; LVH (left ventricular hypertrophy); Accelerated hypertension; Acute on chronic diastolic congestive heart failure (HCC); Class 2 obesity; Uncontrolled diabetes mellitus with hyperglycemia, with long-term current use of insulin  (HCC); Acute on chronic heart failure with preserved ejection fraction (HFpEF) (HCC); Hypertensive emergency; Acute respiratory failure with hypoxia (HCC); Elevated troponin; Pleuritic chest pain; Type 2 diabetes mellitus with peripheral neuropathy (HCC); Gastroesophageal reflux disease without esophagitis; COPD with asthma (HCC); Dyslipidemia; Unstable angina (HCC); Acute on chronic diastolic heart failure (HCC); Myocardial injury; Type II diabetes mellitus with renal manifestations (HCC); Obesity (BMI 30-39.9); HLD (hyperlipidemia); Chronic diastolic CHF (congestive heart failure) (HCC); Diarrhea; CAD (coronary artery disease); CKD (chronic kidney disease) stage 2, GFR 60-89 ml/min; Degenerative disc disease, cervical; Osteoarthritis of spine with radiculopathy, cervical region; History of  colon polyps; COPD exacerbation (HCC); CHF (congestive heart failure) (HCC); Acute pulmonary edema (HCC); Nicotine  dependence; Class 3 obesity; Acute on chronic diastolic (congestive) heart failure (HCC); Chronic kidney disease, stage 3a (HCC); and Stable angina (HCC) on their problem list. he  has a past medical history of Bulging of cervical intervertebral disc, CAD (coronary artery disease), Cervical myelopathy (HCC), Chronic heart failure with preserved ejection fraction (HFpEF) (HCC), CKD (chronic kidney disease), stage III (HCC), COPD (chronic obstructive pulmonary disease) (HCC), Gout, Hyperlipidemia LDL goal <70, Hypertension, Morbid obesity (HCC), OSA (obstructive sleep apnea), Stress due to family tension (04/15/2019), and Tracheobronchomalacia. he  has a past surgical history that includes Cholecystectomy; Colonoscopy with propofol  (N/A, 03/10/2020); RIGHT/LEFT HEART CATH AND CORONARY ANGIOGRAPHY (N/A, 05/29/2023); CORONARY PRESSURE/FFR STUDY (N/A, 05/29/2023); Cardiac catheterization; Back surgery; Colonoscopy with propofol  (N/A, 09/25/2023); polypectomy (09/25/2023); and RIGHT/LEFT HEART CATH AND CORONARY ANGIOGRAPHY (Bilateral, 10/30/2023). Patient denies hx of cancer, stroke, seizures, unexplained weight loss, unexplained changes in bowel or bladder problems, and osteoporosis  Exercise history: no weights at home.   SUBJECTIVE STATEMENT: Pt reports that he has increased stiffness throughout his neck and upper traps and that he is having difficulty bringing his knees to chest because of how soft his bed is.    NSTEMI s/p PCI/DES LAD on 06/08/2024  Top three problems list: hand grip/holding things, tightness in B UT (driving, walking, sitting, UBE)  PAIN: NPRS: Current: 3-4/10 in upper thoracic spine and base of neck radiating to shoulders  From initial eval 05/19/24:  Best: 2/10, Worst: 10/10. Pain location: back of neck down to just past the scapulae, bilateral upper traps to tops of  shoulders, sore over the clavicles and throat to touch. Numbness and tingling bilateral hands (R > L). Numbness in the R hand 2nd and 3rd digits all the time, but can be all the fingers of the R hand. He gets shooting down the left arm to the middle finger and increased numbness spreading from that finger.  Pain description: I can feel where they cut me, muscle spasm (vibrating), choking, cramp Aggravating factors: bending, lifting, raking, driving for a long distance Relieving factors: stay active, getting out to do things he usually does   PRECAUTIONS: Other: lifting heavy stuff and cannot remember other stuff, does not want him to over do stuff, cut back on some of the stuff he is doing at the house. He mentions bed rest but also that the doctor told him to come to physical therapy. Repeatedly hospitalized for complications with his heart (most recently afib)   PATIENT GOALS: like get everything loosened up like it was when he left PT last time, and try to get the pain and tightness to quit.   NEXT MD VISIT: MRI on 05/23/2024 followed by a phone call after the results are in.  OBJECTIVE  There were no vitals filed for this visit.   TREATMENT                                                                                                                            07/21/24    THEREX: Moist heat pack applied to patient upper trap while performing stretches    UBE with seat and arms at 9  for 5 min   Upper Trap Stretch with patient utilizing towel to increase stretch  6 x 60 sec   -min VC to rotate chin to look at pocket Cervical Flexion Stretch with towel to target cervical extensors 3 x 60 sec  -min VC to tuck chin   Modified Seated Open Book with shoulder abduction to 90 deg and extended elbow 2 x 10   -min VC to follow hand with head and body   Modified Seated Open Book with shoulder abducted to 0 deg  and elbow flexed to 90 deg 2 x 10     PATIENT EDUCATION:  Education details:  Update to POC, need for re-evaluation Person educated: Patient Education method: Explanation, Demonstration, Tactile cues, and Verbal cues Education comprehension: verbalized understanding, returned demonstration, and needs further education  HOME EXERCISE PROGRAM: Access Code: [email or web address] URL: [email or web address] Date: 07/21/2024 Prepared by: Toribio Servant  Exercises - Seated Thoracic Rotation: Open Book  - 1 x daily - 7 x weekly - 3 sets - 10 reps - Seated Thoracic Rotation: Open Book (Mirrored)  - 1 x daily - 7 x weekly - 3 sets - 10 reps - Bend head forward when laying down  - 1 x  daily - 7 x weekly - 3 reps - Seated Cervical Sidebending Stretch  - 1 x daily - 7 x weekly - 3 reps - 60 sec  hold - Seated Cervical Sidebending Stretch (Mirrored)  - 1 x daily - 7 x weekly - 3 reps - 60 sec  hold - Quadruped Thoracic Rotation with Hand on Low Back  - 1 x daily - 1 sets - 10 reps - 1 breath hold - 90 seconds time practicing per set - Standing Anatomical Position with Scapular Retraction and Depression at Wall  - 1 x daily - 1 sets - 10 reps - 5 hold - 90 seconds time practicing per set - Standing Anatomical Position with Scapular Retraction and Depression at Wall  - 1 x daily - 1 sets - 10 reps - 5 hold - 90 seconds time practicing per set  ASSESSMENT:  CLINICAL IMPRESSION: Pt shows decrease in neck stiffness with performance of cervical and thoracic stretches and addition of moist heat. Modified open book to include seated position, because pt's difficulty with getting into side lying position. He was able to perform all exercises without increasing his pain and he showed improved cervical mobility by the end of session. He will continue to benefit from continued management of limiting condition by skilled physical therapist to address remaining impairments and functional limitations to work towards stated goals and return to PLOF or maximal functional independence.     OBJECTIVE IMPAIRMENTS: cardiopulmonary status limiting activity, decreased activity tolerance, decreased coordination, decreased endurance, decreased knowledge of condition, decreased ROM, decreased strength, hypomobility, increased edema, increased fascial restrictions, increased muscle spasms, impaired flexibility, impaired sensation, impaired UE functional use, improper body mechanics, postural dysfunction, obesity, and pain.   ACTIVITY LIMITATIONS: carrying, lifting, bending, sitting, bed mobility, reach over head, hygiene/grooming, and caring for others and   PARTICIPATION LIMITATIONS: meal prep, cleaning, laundry, driving, shopping, community activity, occupation, yard work, and  difficulty with usual activities such as using his tractor, being active, personal care, lifting, reading, concentration, work, driving, sleeping, reaching, holding and manipulating things such as a cup and utinsils with hands, eating, shopping, carrying groceries, and recreation  PERSONAL FACTORS: Behavior pattern, Fitness, Past/current experiences, Time since onset of injury/illness/exacerbation, and 3+ comorbidities:  Cervical myelopathy (HCC); Osteoarthritis of spine with radiculopathy, lumbar region; Hyperlipidemia associated with type 2 diabetes mellitus (HCC); Diabetes mellitus (HCC); Idiopathic chronic gout of multiple sites without tophus; Essential hypertension; Dermatitis; Class 2 severe obesity with serious comorbidity and body mass index (BMI) of 39.0 to 39.9 in adult [facility]); Chronic kidney disease (CKD) stage G2/A3, mildly decreased glomerular filtration rate (GFR) between 60-89 mL/min/1.73 square meter and albuminuria creatinine ratio greater than 300 mg/g; Proteinuria; Polyp of ascending colon; Rectal polyp; Other chronic pain; Systolic murmur; Tobacco use; S/P cervical spinal fusion; Former heavy tobacco smoker; Gout; Grade I diastolic dysfunction; LVH (left ventricular hypertrophy); Accelerated  hypertension; Acute on chronic diastolic congestive heart failure (HCC); Class 2 obesity; Uncontrolled diabetes mellitus with hyperglycemia, with long-term current use of insulin  (HCC); Acute on chronic heart failure with preserved ejection fraction (HFpEF) (HCC); Hypertensive emergency; Acute respiratory failure with hypoxia (HCC); Elevated troponin; Pleuritic chest pain; Type 2 diabetes mellitus with peripheral neuropathy (HCC); Gastroesophageal reflux disease without esophagitis; COPD with asthma (HCC); Dyslipidemia; Unstable angina (HCC); Acute on chronic diastolic heart failure (HCC); Myocardial injury; Type II diabetes mellitus with renal manifestations (HCC); Obesity (BMI 30-39.9); HLD (hyperlipidemia); Chronic diastolic CHF (congestive heart failure) (HCC); Diarrhea; CAD (coronary artery disease); CKD (chronic kidney  disease) stage 2, GFR 60-89 ml/min; Degenerative disc disease, cervical; Osteoarthritis of spine with radiculopathy, cervical region; History of colon polyps; COPD exacerbation (HCC); CHF (congestive heart failure) (HCC); Acute pulmonary edema (HCC); Nicotine  dependence; Class 3 obesity; Acute on chronic diastolic (congestive) heart failure (HCC); Chronic kidney disease, stage 3a (HCC); and Stable angina (HCC) on their problem list. he  has a past medical history of Bulging of cervical intervertebral disc, CAD (coronary artery disease), Cervical myelopathy (HCC), Chronic heart failure with preserved ejection fraction (HFpEF) (HCC), CKD (chronic kidney disease), stage III (HCC), COPD (chronic obstructive pulmonary disease) (HCC), Gout, Hyperlipidemia LDL goal <70, Hypertension, Morbid obesity (HCC), OSA (obstructive sleep apnea), Stress due to family tension (04/15/2019), and Tracheobronchomalacia. he  has a past surgical history that includes Cholecystectomy; Colonoscopy with propofol  (N/A, 03/10/2020); RIGHT/LEFT HEART CATH AND CORONARY ANGIOGRAPHY (N/A, 05/29/2023); CORONARY PRESSURE/FFR STUDY  (N/A, 05/29/2023); Cardiac catheterization; Back surgery; Colonoscopy with propofol  (N/A, 09/25/2023); polypectomy (09/25/2023); and RIGHT/LEFT HEART CATH AND CORONARY ANGIOGRAPHY (Bilateral, 10/30/2023) are also affecting patient's functional outcome.   REHAB POTENTIAL: Fair due to severity and complexity of condition and past experiences. Good rehab potential to improve to where he was at last PT discharge.   CLINICAL DECISION MAKING: Evolving/moderate complexity  EVALUATION COMPLEXITY: Moderate   GOALS: Goals reviewed with patient? No  SHORT TERM GOALS: Target date: 06/02/2024  Patient will be independent with initial home exercise program for self-management of symptoms. Baseline: Initial HEP provided at IE (05/19/24); Goal status: MET   LONG TERM GOALS: Target date: 08/11/2024. Target date updated to 10/06/2024 for all unmet goal son 07/14/2024.   Patient will be independent with a long-term home exercise program for self-management of symptoms.  Baseline: Initial HEP provided at IE (05/19/24); has not been performing since his heart attack (07/14/2024);  Goal status: Ongoing  2.  Patient will demonstrate improved Neck Disability Index (NDI) to equal or less than 10% to demonstrate improvement in overall condition and self-reported functional ability.  Baseline: 58% (05/19/24); 52% (07/14/2024);  Goal status: Ongoing  3.  Patient will demonstrate increased UE myotomes by equal or greater than 1/2 MMT or up to 4+/5 to improve his ability to use his B UE for riding the tractor and completing household activities.  Baseline: as low as 3/5  (05/19/24); similar as low as 3/5 (07/14/24);  Goal status: Ongoing  4.  Patient will demonstrate improved cervical spine rotation to 40 degrees bilaterally to maximize his use of the AA ROM and view his surroundings more easily.  Baseline: R/L 15/34 (05/19/24); R/L 30/35 (07/14/24);  Goal status: In progress  5.  Patient will demonstrate  improvement in Patient Specific Functional Scale (PSFS) of equal or greater than 8/10 points to reflect clinically significant improvement in patient's most valued functional activities. Baseline: to be measured at visit 2 as appropriate (05/19/24); 3.3/10 (05/21/2024); 2.3/10 (07/14/2024);  Goal status: Ongoing    PLAN:  PT FREQUENCY: 2x/week  PT DURATION: 8-12 weeks  PLANNED INTERVENTIONS: 97164- PT Re-evaluation, 97750- Physical Performance Testing, 97110-Therapeutic exercises, 97530- Therapeutic activity, [id number]- Neuromuscular re-education, 97535- Self Care, [id number]- Manual therapy, [phone]- Aquatic Therapy, [id number]- Electrical stimulation (unattended), 20560 (1-2 muscles), 20561 (3+ muscles)- Dry Needling, Patient/Family education, Joint mobilization, Spinal mobilization, Cryotherapy, and Moist heat  PLAN FOR NEXT SESSION: Focus on thoracic mobility exercises and functional tasks. Stretches for neck flexors and accessory breathing muscles. update HEP as appropriate, educate on mechanical stressors and go through day to help with education on alleviation of these for the  short term, exercise to improve intrascapular activation and strength for more balanced force couple and decrease over-reliance on UT, interventions to improve thoracic and AA/OA mobility. education  Toribio Servant PT, DPT  [facility] [address], [place], [address] Phone: [phone]   Fax:  [phone]

## 2024-07-21 NOTE — Patient Instructions (Addendum)
 Medication Changes:  Start Hydralazine  25 mg twice daily   Lab Work:  Go downstairs to [place] [facility] on LOWER LEVEL to have your blood work completed.  We will only call you if the results are abnormal or if the provider would like to make medication changes.  No news is good news.   Follow-Up in: 2 months with [name], FNP.   Thank you for choosing [facility].    At the [facility], you and your health needs are our priority. We have a designated team specialized in the treatment of Heart Failure. This Care Team includes your primary Heart Failure Specialized Cardiologist (physician), Advanced Practice Providers (APPs- Physician Assistants and Nurse Practitioners), and Pharmacist who all work together to provide you with the care you need, when you need it.   You may see any of the following providers on your designated Care Team at your next follow up:  Dr. Toribio Fuel Dr. Ezra Shuck Dr. Ria Commander Dr. Morene Brownie [name], FNP Jaun Bash, RPH-CPP  Please be sure to bring in all your medications bottles to every appointment.   Need to Contact Us :  If you have any questions or concerns before your next appointment please send us  a message through [place] or call our office at [phone].    TO LEAVE A MESSAGE FOR THE NURSE SELECT OPTION 2, PLEASE LEAVE A MESSAGE INCLUDING: YOUR NAME DATE OF BIRTH CALL BACK NUMBER REASON FOR CALL**this is important as we prioritize the call backs  YOU WILL RECEIVE A CALL BACK THE SAME DAY AS LONG AS YOU CALL BEFORE 4:00 PM

## 2024-07-21 NOTE — Progress Notes (Unsigned)
 Advanced Heart Failure Clinic Note    PCP: Leavy Mole, PA-C Primary Cardiologist: Redell Cave, MD/ Abigail Motto, [place]   Chief Complaint: shortness of breath   HPI:  Cory Rivas is a 58 y/o male with a history of DM, hyperlipidemia, tracheobronchomalacia, nonobstructive CAD, HTN, gout, COPD, asthma, CKD, dyslipidemia, tobacco use and chronic heart failure.   Echo 11/04/20: EF 60-65% with Grade I DD, mild LVH/ LAE and normal PA pressure of 31.6 mmHg  Admitted 08/23/22 due to dyspnea, productive cough of brown sputum, mild sore throat, fatigue, dizziness and chest soreness. Steroid burst given. Initially given IV lasix  with transition to oral diuretics. Echo 08/23/22: EF of 60-65% along with mild LVH & mild Cory. Lisinopril  held due to AKI. Discharged after 2 days. Admitted 11/22/22 due to acute on chronic HF. Weaned off of bipap.Was in the ED 03/01/23 due to neck pain after falling off his tractor after he hit a stump. He landed on his right side.Head CT/ cervical spine xrays were negative. Was in the ED 03/09/23 due to atypical chest pain. Chest CTA negative for PE. Was in the ED 03/22/23 due to nonspecific chest pain. Workup negative. Was in the ED 03/23/23 due to hyperglycemia with glucose of >400.    Admitted 05/27/23 due to acute onset of chest pain, shortness of breath and diaphoresis. Blood pressure was significantly elevated to 204/102 on admission. Troponins were mildly elevated up to 26. Cardiology was consulted and he underwent cardiac catheterization 8/14 which revealed non-obstructive coronary artery disease. Meds adjusted. Chest x-ray without acute cardiopulmonary process. Echo on 05/28/2023 showed an EF of 50 to 55%, no regional wall motion abnormalities, moderate LVH, grade 3 diastolic dysfunction, elevated left atrial pressure, normal RV systolic function and ventricular cavity size, mildly dilated left atrium, mild mitral regurgitation, aortic valve sclerosis without evidence of stenosis, and  an estimated right atrial pressure of 3 mmHg.   Admitted 08/19/23 with chest pain/ pressure for 2 days located in the front chest. Worse with deep breath. Has shortness of breath and cough along with this. Negative D-dimer. Troponin 23 => 22. 1 dose of IV Lasix  as BNP 143.9. Glucose 409. Was in the ED 08/31/23 with central chest pressure along with shortness of breath & cough. Labs reassuring with normal troponin. Negative D-dimer. CXR concerning for pneumonia so antibiotics provided.   Admitted 10/21/23 due to acute hypoxic respiratory failure secondary to COPD exacerbation and acute on chronic HFpEF. Found to have worsening CKD. High-sensitivity troponin 21 with a delta troponin of 20 subsequently trended to 19. BNP 331. D-dimer negative. Supplemental oxygen given. Initially given IV lasix  with transition to increased home torsemide . Lisinopril , spironolactone , and torsemide  were held due to AKI.    R/LHC 10/30/23:  Moderate LAD and OM1 disease, not significantly different compared to prior catheterization in 05/2023.  RFR of sequential ostial and proximal LAD stenosis at that time was not hemodynamically significant. Normal left and right heart filling pressures. Mild pulmonary hypertension. Normal Fick cardiac output/index.        Echo 11/12/23: EF 55-60%, normal RV, mild Cory  Admitted 01/20/24 with worsening cough, wheezing & shortness of breath. Gained 10 pounds in the last month.IV diuresed. Chest x-ray showed pulmonary congestion as well as chronic interstitial changes compatible with COPD. Meds changed.   Admitted 02/17/24 with cough, shortness of breath, orthopnea, exertional dyspnea, chest tightness, and leg swelling. Patient has gained 7-8 pounds since previous discharge dry weight. On admission, BNP was 300.0 and HS-troponin was 27. Chest  x-ray noted mild cardiomegaly and interstitial edema. Placed on bipap, given IV lasix . Torsemide , lisinopril  and bisoprolol  all held  Seen in [facility] 05/25 and had  lisinopril  switched to valsartan  40mg  daily and torsemide  40mg  daily resumed.   Seen in ED 04/02/24 due to chest tightness and SOB which resolved by the time of ED presentation. Labs unremarkable. Bisoprolol  increased to 10mg .   Admitted 04/03/24 with SOB which was worse w/ ambulation or sitting up. No improvement with nebulizer treatment. Pulmonary edema noted on CXR. Troponins mildly flat and stable. IV diuresed with improvement of symptoms. Remains off CPAP.   Was in the ED 04/12/24 due to SOB with associated chest pressure. Placed on CPAP by EMS and given ASA with resolution of chest pain. Since patient was feeling better, he declined admission.   Was in the 05/17/24 due to chest pain. Prior to admission, took baby aspirin  but symptoms return prompting ED visit. CXR showed no cardiopulmonary disease. Started on ranexa  and discharged.   Was in the ED 06/04/24 due to GI complication.   Was in the ED 06/08/24 due to acute chest pain. EKG initally showed NSR with T wave inversion inferiorly and laterally. Troponin increased during visit from 19 to 47. NSTEMI. CT Chest negative PE. Patient given IV fentanyl , sublingual nitro  Echo 06/08/24 L/R HC 06/08/24.   Was in the ED 07/07/24 due to GI complication.   He presents today for a HF follow-up visit with a chief complaint of fatigue.   Faitgue, no shortness breath, no dizziness Does have cough  Inhaltion pain since Sunday or Saturday.  No sleep interruptions , sleeps with elevated HOB , 2 -3 pillows , like 30 degree  Neck MRI ([facility]), wants him  Has not seen  Upcoming sleep study  EM, no treatment changes , bodyaches and abdominal pain  Unsure las time he saw PCP  Not adherent with medications due to cost.  Weighing daily  Not monitoring BP due to cuff  NAS  Fluid intake of 4 or more  Smoking,1ppd from 2ppd, using lozagens No alcohol   He presents today for a HF follow-up visit with a chief complaint of minimal shortness of breath. Sleeping  in hospital bed with [facility] elevated chronically. Has chronic neck pain with occasional numbness/ tingling down the left arm. Will be having neck MRI soon. Reports that he feels much better since he recent ED visit. He is wondering if this wasn't triggered by anxiety as he just had the 1 year anniversary of his mom's death.   Did not bring his medication packs and doesn't know what he's taking.   ROS: All systems negative except as listed in HPI, PMH and Problem List.  SH:  Social History   Socioeconomic History   Marital status: Divorced    Spouse name: Not on file   Number of children: 0   Years of education: 9   Highest education level: 9th grade  Occupational History   Occupation: disability  Tobacco Use   Smoking status: Every Day    Current packs/day: 0.50    Average packs/day: 1 pack/day for 38.3 years (37.7 ttl pk-yrs)    Types: Cigarettes    Start date: 10/19/1989    Last attempt to quit: 08/20/2022   Smokeless tobacco: Never   Tobacco comments:    30+ years hx smoking 2 ppd, stopped smoking briefly in April 2022 for a surgery and then restarted smoking.  As of August 2025, he smoking about 2 to 4  cigarettes a day.  Vaping Use   Vaping status: Never Used  Substance and Sexual Activity   Alcohol use: No   Drug use: No   Sexual activity: Not Currently    Partners: Female  Other Topics Concern   Not on file  Social History Narrative   Lives with sister Cory Rivas, brother in law Cory Rivas), and his mother - Cory Rivas   Does not routinely exercise.   Social Drivers of Health   Financial Resource Strain: High Risk (01/21/2024)   Overall Financial Resource Strain (CARDIA)    Difficulty of Paying Living Expenses: Hard  Food Insecurity: No Food Insecurity (04/04/2024)   Hunger Vital Sign    Worried About Running Out of Food in the Last Year: Never true    Ran Out of Food in the Last Year: Never true  Transportation Needs: No Transportation Needs (04/04/2024)   PRAPARE -  Administrator, Civil Service (Medical): No    Lack of Transportation (Non-Medical): No  Physical Activity: Insufficiently Active (12/06/2022)   Exercise Vital Sign    Days of Exercise per Week: 3 days    Minutes of Exercise per Session: 10 min  Stress: No Stress Concern Present (12/06/2022)   [employer] of Occupational Health - Occupational Stress Questionnaire    Feeling of Stress : Only a little  Social Connections: Socially Isolated (02/17/2024)   Social Connection and Isolation Panel    Frequency of Communication with Friends and Family: More than three times a week    Frequency of Social Gatherings with Friends and Family: Twice a week    Attends Religious Services: Never    Database administrator or Organizations: No    Attends Banker Meetings: Never    Marital Status: Divorced  Catering manager Violence: Not At Risk (04/04/2024)   Humiliation, Afraid, Rape, and Kick questionnaire    Fear of Current or Ex-Partner: No    Emotionally Abused: No    Physically Abused: No    Sexually Abused: No    FH:  Family History  Problem Relation Age of Onset   Heart disease Mother    Diabetes Mother    Hyperlipidemia Mother    Hypertension Mother    Heart attack Mother    Lung cancer Father    Hypertension Father    Stroke Father    AAA (abdominal aortic aneurysm) Maternal Grandmother    Heart attack Maternal Grandmother    Diabetes Maternal Grandfather     Past Medical History:  Diagnosis Date   Bulging of cervical intervertebral disc    CAD (coronary artery disease)    a. 09/2020 MV: low risk; b. 05/2023 Cath: Moderate LAD dzs w/ seq 50-60% stenoses and nl RFR 0.93. Mod OM dzs->Med rx; b. 10/2023 Cath: LM min irregs, LAD 55ost, 55p/m, LCX large nl, OM1 50, RCA mod, nl.   Cervical myelopathy (HCC)    Chronic heart failure with preserved ejection fraction (HFpEF) (HCC)    a. 05/2023 Echo: EF 50-55%, no rwma, GrIII DD, nl RV fxn, mild Cory, Ao sclerosis;  b. 10/2023 Echo: EF 55-60%, no rwma, nl RV fxn, mild Cory, Ao sclerosis.   CKD (chronic kidney disease), stage III (HCC)    COPD (chronic obstructive pulmonary disease) (HCC)    Diabetes mellitus without complication (HCC)    Gout    Hyperlipidemia LDL goal <70    Hypertension    Morbid obesity (HCC)    OSA (obstructive sleep apnea)  Stress due to family tension 04/15/2019   Tracheobronchomalacia     Current Outpatient Medications  Medication Sig Dispense Refill   allopurinol  (ZYLOPRIM ) 100 MG tablet Take 1 tablet (100 mg total) by mouth daily with the 300mg  tablet for a total daily dose of 400mg . 90 tablet 1   allopurinol  (ZYLOPRIM ) 300 MG tablet Take 1 tablet (300 mg total) by mouth daily as needed (Gout flare). Home med.     amLODipine  (NORVASC ) 10 MG tablet Take 1 tablet (10 mg total) by mouth daily. 90 tablet 3   aspirin  EC 81 MG tablet Take 1 tablet (81 mg total) by mouth daily. Swallow whole. 90 tablet 3   baclofen  (LIORESAL ) 10 MG tablet Take 1 tablet (10 mg total) by mouth 3 (three) times daily as needed for muscle spasms. 90 tablet 3   bisoprolol  (ZEBETA ) 5 MG tablet Take 2 tablets (10 mg total) by mouth every morning AND 1 tablet (5 mg total) at bedtime. 270 tablet 3   budesonide -formoterol  (SYMBICORT ) 160-4.5 MCG/ACT inhaler Inhale 2 puffs into the lungs 2 (two) times daily. (Patient taking differently: Inhale 2 puffs into the lungs 2 (two) times daily. As needed) 10.2 g 12   cetirizine  (ZYRTEC ) 10 MG tablet Take 1 tablet (10 mg total) by mouth daily. 30 tablet 11   Continuous Glucose Sensor (FREESTYLE LIBRE 3 SENSOR) MISC Place 1 sensor onto skin every 14 (fourteen) days to monitor blood sugar continuously (Patient not taking: Reported on 07/17/2024) 6 each 2   cromolyn  (OPTICROM ) 4 % ophthalmic solution Place 1 drop into both eyes 4 (four) times daily as needed. 10 mL 2   Dulaglutide  (TRULICITY ) 3 MG/0.5ML SOAJ Inject 3 mg into the skin once a week. 2 mL 10   DULoxetine   (CYMBALTA ) 30 MG capsule Take 2 capsules (60 mg total) by mouth every morning AND 1 capsule (30 mg total) every evening. 270 capsule 1   empagliflozin  (JARDIANCE ) 10 MG TABS tablet Take 1 tablet (10 mg total) by mouth daily. 30 tablet 5   Evolocumab  (REPATHA  SURECLICK) 140 MG/ML SOAJ Inject 140 mg into the skin every 14 (fourteen) days. 2 mL 2   ezetimibe  (ZETIA ) 10 MG tablet Take 1 tablet (10 mg total) by mouth daily. 90 tablet 1   fluticasone  (FLONASE ) 50 MCG/ACT nasal spray Place 1 spray into both nostrils daily. 16 g 6   Glucagon  (BAQSIMI ONE PACK) 3 MG/DOSE POWD Place 1 Device into the nose as needed (Low blood sugar with impaired consciousness). 2 each 3   insulin  glargine (LANTUS  SOLOSTAR) 100 UNIT/ML Solostar Pen Inject 20-25 Units into the skin at bedtime. 30 mL 1   insulin  lispro (HUMALOG ) 100 UNIT/ML KwikPen Inject 10 Units into the skin 3 (three) times daily. Plus sliding scale up per instructions 27 mL 1   ipratropium-albuterol  (DUONEB) 0.5-2.5 (3) MG/3ML SOLN Take 3 mLs by nebulization every 6 (six) hours as needed. 180 mL 1   levocetirizine (XYZAL ) 5 MG tablet Take 1 tablet (5 mg total) by mouth every evening. 90 tablet 1   nicotine  polacrilex (NICOTINE  MINI) 2 MG lozenge Take 1 lozenge (2 mg total) by mouth every 2 (two) hours as needed for smoking cessation. 72 lozenge 3   pantoprazole  (PROTONIX ) 40 MG tablet Take 1 tablet (40 mg total) by mouth daily. 30 tablet 1   prasugrel  (EFFIENT ) 10 MG TABS tablet Take 1 tablet (10 mg total) by mouth daily. 90 tablet 3   pregabalin  (LYRICA ) 50 MG capsule Take 1 capsule (  50 mg total) by mouth 2 (two) times daily as needed. Home med.     ranolazine  (RANEXA ) 500 MG 12 hr tablet Take 1 tablet (500 mg total) by mouth 2 (two) times daily. 60 tablet 2   rosuvastatin  (CRESTOR ) 40 MG tablet Take 1 tablet (40 mg total) by mouth daily. 90 tablet 1   spironolactone  (ALDACTONE ) 25 MG tablet Take 1 tablet (25 mg total) by mouth daily. 90 tablet 3    sucralfate  (CARAFATE ) 1 g tablet Take 1 tablet (1 g total) by mouth 4 (four) times daily -  with meals and at bedtime. 120 tablet 0   Tiotropium Bromide  Monohydrate (SPIRIVA  RESPIMAT) 2.5 MCG/ACT AERS Inhale 2 puffs into the lungs daily. 4 g 11   torsemide  (DEMADEX ) 20 MG tablet Take 2 tablets (40 mg total) by mouth daily. 60 tablet 6   valsartan  (DIOVAN ) 80 MG tablet Take 1 tablet (80 mg total) by mouth daily. 30 tablet 5   VENTOLIN  HFA 108 (90 Base) MCG/ACT inhaler Inhale 2 puffs into the lungs every 6 (six) hours as needed for wheezing or shortness of breath. 18 g 2   No current facility-administered medications for this visit.   There were no vitals filed for this visit.  Wt Readings from Last 3 Encounters:  07/17/24 99.7 kg  07/13/24 101.5 kg  07/07/24 102.9 kg   Lab Results  Component Value Date   CREATININE 1.69 (H) 07/07/2024   CREATININE 1.37 (H) 06/09/2024   CREATININE 1.19 06/08/2024    PHYSICAL EXAM:  General: Well appearing. No resp difficulty HEENT: normal Neck: supple, no JVD Cor: Regular rhythm, rate. No rubs, gallops or murmurs Lungs: clear Abdomen: soft, nontender, nondistended. Extremities: no cyanosis, clubbing, rash, edema Neuro: alert & oriented X 3. Moves all 4 extremities w/o difficulty. Affect pleasant   ECG: not done   ASSESSMENT & PLAN:  1: NICM with preserved ejection fraction- - suscpect due to HTN and OSA - NYHA class II - euvolemic - weighing daily; reminded to call for an overnight weight gain of >2 pounds or a weekly weight gain of > 5 pounds - weight down 3 pounds from last visit here 5 weeks ago - Echo 11/04/20: EF 60-65% with Grade I DD, mild LVH/ LAE and normal PA pressure of 31.6 mmHg - Echo 08/23/22: EF of 60-65% along with mild LVH & mild Cory. - Echo 05/28/23: EF 50-55% with moderate LVH, Grade III DD and mild Cory - Echo 11/12/23: EF 55-60%, normal RV, mild Cory - continue bisoprolol  10mg  daily - continue jardiance  10mg  every day -  continue torsemide  40mg  daily  - continue spironolactone  25mg  daily - continue valsartan  80mg  daily - BNP 04/12/24 was 197.9 - emphasized, again, the importance of bringing medication packs to every visit because he was asking about a little blue pill  2: HTN- - BP 169/84 - saw PCP Arla) 05/25 - BMET 04/12/24 reviewed: sodium 140, potassium 3.7, creatinine 1.3 & GFR >60 - BMET today  3: DM- - A1c 11/28/23 was 7.9% - saw endocrinology [place]) 10/25 - 03/21/23 microalbumin/ creat ratio was 1338  4: Reactive airway disease- - saw pulmonology (Dgayli) 06/24 - using spiriva   - has not smoked in the last 2-3 weeks - CPAP was returned as he wasn't wearing it long enough each night - has pulm appt 08/25; needs sleep apnea addressed or fluid retention will most likely continue  5: Hyperlipidemia- - continue ezetimibe  10mg  daily - continue repatha  every 2 weeks - continue  rosuvastatin  40mg  daily - lipid panel 11/15/23 showed LDL 101, triglycerides 209 (both improving) - lipid panel today  6: CAD- - saw cardiology ([place]) 04/25 - continue ezetimibe  10mg  daily - continue rosuvastatin  40mg  daily - lipid panel today - R/LHC 10/30/23:    Moderate LAD and OM1 disease, not significantly different compared to prior catheterization in 05/2023.  RFR of sequential ostial and proximal LAD stenosis at that time was not hemodynamically significant.   Normal left and right heart filling pressures.   Mild pulmonary hypertension.   Normal Fick cardiac output/index.   Return in 3 months, sooner if needed.   Solomon Blumenthal, RN 07/21/24

## 2024-07-22 ENCOUNTER — Ambulatory Visit: Payer: Self-pay | Admitting: Family

## 2024-07-22 ENCOUNTER — Encounter: Payer: Self-pay | Admitting: Family

## 2024-07-22 LAB — BASIC METABOLIC PANEL WITH GFR
BUN/Creatinine Ratio: 9 (ref 9–20)
BUN: 15 mg/dL (ref 6–24)
CO2: 23 mmol/L (ref 20–29)
Calcium: 8.8 mg/dL (ref 8.7–10.2)
Chloride: 99 mmol/L (ref 96–106)
Creatinine, Ser: 1.58 mg/dL — ABNORMAL HIGH (ref 0.76–1.27)
Glucose: 293 mg/dL — ABNORMAL HIGH (ref 70–99)
Potassium: 4.4 mmol/L (ref 3.5–5.2)
Sodium: 138 mmol/L (ref 134–144)
eGFR: 51 mL/min/1.73 — ABNORMAL LOW (ref >59)

## 2024-07-22 NOTE — Therapy (Signed)
 OUTPATIENT PHYSICAL THERAPY TREATMENT   Patient Name: DANYL DEEMS MRN: [id number] DOB:1966/09/07, 58 y.o., male Today's Date: 07/23/2024  END OF SESSION:  PT End of Session - 07/23/24 1029     Visit Number 9    Number of Visits 17    Date for Recertification  10/06/24    Authorization Type [place] MEDICAID HE[employer]Y BLUE reporting period from 05/19/2024    Authorization Time Period HB auth#[id number] for 13 PT vsts from 8/7-11/5    Authorization - Visit Number 9    Authorization - Number of Visits 13    Progress Note Due on Visit 10    PT Start Time 1030    PT Stop Time 1111    PT Time Calculation (min) 41 min    Activity Tolerance Patient tolerated treatment well    Behavior During Therapy [facility] for tasks assessed/performed                  Past Medical History:  Diagnosis Date   Bulging of cervical intervertebral disc    CAD (coronary artery disease)    a. 09/2020 MV: low risk; b. 05/2023 Cath: Moderate LAD dzs w/ seq 50-60% stenoses and nl RFR 0.93. Mod OM dzs->Med rx; b. 10/2023 Cath: LM min irregs, LAD 55ost, 55p/m, LCX large nl, OM1 50, RCA mod, nl.   Cervical myelopathy (HCC)    Chronic heart failure with preserved ejection fraction (HFpEF) (HCC)    a. 05/2023 Echo: EF 50-55%, no rwma, GrIII DD, nl RV fxn, mild MR, Ao sclerosis; b. 10/2023 Echo: EF 55-60%, no rwma, nl RV fxn, mild MR, Ao sclerosis.   CKD (chronic kidney disease), stage III (HCC)    COPD (chronic obstructive pulmonary disease) (HCC)    Diabetes mellitus without complication (HCC)    Gout    Hyperlipidemia LDL goal <70    Hypertension    Morbid obesity (HCC)    OSA (obstructive sleep apnea)    Stress due to family tension 04/15/2019   Tracheobronchomalacia    Past Surgical History:  Procedure Laterality Date   BACK SURGERY     CARDIAC CATHETERIZATION     CHOLECYSTECTOMY     COLONOSCOPY WITH PROPOFOL  N/A 03/10/2020   Procedure: COLONOSCOPY WITH PROPOFOL ;  Surgeon: Jinny Carmine, MD;  Location: [facility]  ENDOSCOPY;  Service: Endoscopy;  Laterality: N/A;   COLONOSCOPY WITH PROPOFOL  N/A 09/25/2023   Procedure: COLONOSCOPY WITH PROPOFOL ;  Surgeon: Jinny Carmine, MD;  Location: [facility] ENDOSCOPY;  Service: Endoscopy;  Laterality: N/A;   CORONARY IMAGING/OCT N/A 06/08/2024   Procedure: CORONARY IMAGING/OCT;  Surgeon: Darron Deatrice LABOR, MD;  Location: [facility] INVASIVE CV LAB;  Service: Cardiovascular;  Laterality: N/A;   CORONARY PRESSURE/FFR STUDY N/A 05/29/2023   Procedure: CORONARY PRESSURE/FFR STUDY;  Surgeon: Mady Bruckner, MD;  Location: [facility] INVASIVE CV LAB;  Service: Cardiovascular;  Laterality: N/A;   CORONARY STENT INTERVENTION N/A 06/08/2024   Procedure: CORONARY STENT INTERVENTION;  Surgeon: Darron Deatrice LABOR, MD;  Location: [facility] INVASIVE CV LAB;  Service: Cardiovascular;  Laterality: N/A;   LEFT HEART CATH AND CORONARY ANGIOGRAPHY N/A 06/08/2024   Procedure: LEFT HEART CATH AND CORONARY ANGIOGRAPHY;  Surgeon: Darron Deatrice LABOR, MD;  Location: [facility] INVASIVE CV LAB;  Service: Cardiovascular;  Laterality: N/A;   POLYPECTOMY  09/25/2023   Procedure: POLYPECTOMY;  Surgeon: Jinny Carmine, MD;  Location: [facility] ENDOSCOPY;  Service: Endoscopy;;   RIGHT/LEFT HEART CATH AND CORONARY ANGIOGRAPHY N/A 05/29/2023   Procedure: RIGHT/LEFT HEART CATH AND CORONARY ANGIOGRAPHY;  Surgeon: Mady Bruckner,  MD;  Location: [facility] INVASIVE CV LAB;  Service: Cardiovascular;  Laterality: N/A;   RIGHT/LEFT HEART CATH AND CORONARY ANGIOGRAPHY Bilateral 10/30/2023   Procedure: RIGHT/LEFT HEART CATH AND CORONARY ANGIOGRAPHY;  Surgeon: Mady Bruckner, MD;  Location: [facility] INVASIVE CV LAB;  Service: Cardiovascular;  Laterality: Bilateral;   Patient Active Problem List   Diagnosis Date Noted   Chest pain 06/08/2024   Hypertensive urgency 06/08/2024   Non-ST elevation (NSTEMI) myocardial infarction (HCC) 06/08/2024   Chronic kidney disease, stage 3a (HCC) 05/17/2024   Stable angina 05/17/2024   Acute on chronic diastolic  (congestive) heart failure (HCC) 04/03/2024   Class 3 obesity (HCC) 02/18/2024   Nicotine  dependence 01/23/2024   Acute pulmonary edema (HCC) 01/22/2024   CHF (congestive heart failure) (HCC) 01/20/2024   COPD exacerbation (HCC) 10/21/2023   History of colon polyps 09/25/2023   Myocardial injury 08/19/2023   Type II diabetes mellitus with renal manifestations (HCC) 08/19/2023   Obesity (BMI 30-39.9) 08/19/2023   HLD (hyperlipidemia) 08/19/2023   Chronic diastolic CHF (congestive heart failure) (HCC) 08/19/2023   Diarrhea 08/19/2023   CAD (coronary artery disease) 08/19/2023   CKD (chronic kidney disease) stage 2, GFR 60-89 ml/min 08/19/2023   Acute on chronic diastolic heart failure (HCC) 05/30/2023   Unstable angina (HCC) 05/29/2023   Pleuritic chest pain 05/27/2023   Type 2 diabetes mellitus with peripheral neuropathy (HCC) 05/27/2023   Gastroesophageal reflux disease without esophagitis 05/27/2023   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   Dyslipidemia 05/27/2023   Hypertensive emergency 11/22/2022   Acute respiratory failure with hypoxia (HCC) 11/22/2022   Elevated troponin 11/22/2022   Chronic heart failure with preserved ejection fraction (HFpEF) (HCC) 08/24/2022   Acute on chronic diastolic congestive heart failure (HCC) 08/23/2022   Class 2 obesity 08/23/2022   Uncontrolled diabetes mellitus with hyperglycemia, with long-term current use of insulin  (HCC) 08/23/2022   Grade I diastolic dysfunction 06/08/2022   LVH (left ventricular hypertrophy) 06/08/2022   Accelerated hypertension 06/08/2022   S/P cervical spinal fusion 08/18/2021   Former heavy tobacco smoker 08/18/2021   Gout 08/18/2021   Other chronic pain 10/26/2020   Systolic murmur 10/26/2020   Tobacco use 10/26/2020   Polyp of ascending colon    Rectal polyp    Class 2 severe obesity with serious comorbidity and body mass index (BMI) of 39.0 to 39.9 in adult 04/15/2019   Chronic kidney disease (CKD) stage G2/A3,  mildly decreased glomerular filtration rate (GFR) between 60-89 mL/min/1.73 square meter and albuminuria creatinine ratio greater than 300 mg/g 04/15/2019   Proteinuria 04/15/2019   Hyperlipidemia associated with type 2 diabetes mellitus (HCC) 03/18/2019   Diabetes mellitus (HCC) 03/18/2019   Idiopathic chronic gout of multiple sites without tophus 03/18/2019   Essential hypertension 03/18/2019   Dermatitis 03/18/2019   Cervical myelopathy (HCC) 11/22/2015   Osteoarthritis of spine with radiculopathy, lumbar region 11/22/2015   Degenerative disc disease, cervical 11/22/2015   Osteoarthritis of spine with radiculopathy, cervical region 11/22/2015    PCP: Leavy Mole, PA-C  REFERRING PROVIDER: Corrothers, Duwaine Norris, NP (neurosurgery)  REFERRING DIAG:  s/p cervical spinal fusion, neck pain, cervicalgia  THERAPY DIAG:  Cervicalgia  Radiculopathy, cervical region  Muscle weakness (generalized)  Rationale for Evaluation and Treatment: Rehabilitation  ONSET DATE: chronic over many years but underwent C2-6 posterior laminectomy with C2-T2 instrumentation and fusion with Dr. Salina in April 2022   SUBJECTIVE:  PERTINENT HISTORY:  Patient is a 58 y.o. male who presents to outpatient physical therapy with a referral for medical diagnosis  s/p cervical spinal fusion, neck pain, cervicalgia. This patient's chief complaints consist of chronic neck pain and stiffness, UE paresthesia, weakness, and incoordination leading to the following functional deficits: difficulty with usual activities such as using his tractor, being active, personal care, lifting, reading, concentration, work, driving, sleeping, reaching, holding and manipulating things such as a cup and utinsils with hands, eating, shopping,  carrying groceries, and recreation. Relevant past medical history and comorbidities include the following: he has NSTEMI s/p PCI/DES LAD on 06/08/2024; Lumbar degenerative disc disease; Cervical myelopathy (HCC); Osteoarthritis of spine with radiculopathy, lumbar region; Hyperlipidemia associated with type 2 diabetes mellitus (HCC); Diabetes mellitus (HCC); Idiopathic chronic gout of multiple sites without tophus; Essential hypertension; Dermatitis; Class 2 severe obesity with serious comorbidity and body mass index (BMI) of 39.0 to 39.9 in adult [facility]); Chronic kidney disease (CKD) stage G2/A3, mildly decreased glomerular filtration rate (GFR) between 60-89 mL/min/1.73 square meter and albuminuria creatinine ratio greater than 300 mg/g; Proteinuria; Polyp of ascending colon; Rectal polyp; Other chronic pain; Systolic murmur; Tobacco use; S/P cervical spinal fusion; Former heavy tobacco smoker; Gout; Grade I diastolic dysfunction; LVH (left ventricular hypertrophy); Accelerated hypertension; Acute on chronic diastolic congestive heart failure (HCC); Class 2 obesity; Uncontrolled diabetes mellitus with hyperglycemia, with long-term current use of insulin  (HCC); Acute on chronic heart failure with preserved ejection fraction (HFpEF) (HCC); Hypertensive emergency; Acute respiratory failure with hypoxia (HCC); Elevated troponin; Pleuritic chest pain; Type 2 diabetes mellitus with peripheral neuropathy (HCC); Gastroesophageal reflux disease without esophagitis; COPD with asthma (HCC); Dyslipidemia; Unstable angina (HCC); Acute on chronic diastolic heart failure (HCC); Myocardial injury; Type II diabetes mellitus with renal manifestations (HCC); Obesity (BMI 30-39.9); HLD (hyperlipidemia); Chronic diastolic CHF (congestive heart failure) (HCC); Diarrhea; CAD (coronary artery disease); CKD (chronic kidney disease) stage 2, GFR 60-89 ml/min; Degenerative disc disease, cervical; Osteoarthritis of spine with radiculopathy,  cervical region; History of colon polyps; COPD exacerbation (HCC); CHF (congestive heart failure) (HCC); Acute pulmonary edema (HCC); Nicotine  dependence; Class 3 obesity; Acute on chronic diastolic (congestive) heart failure (HCC); Chronic kidney disease, stage 3a (HCC); and Stable angina (HCC) on their problem list. he  has a past medical history of Bulging of cervical intervertebral disc, CAD (coronary artery disease), Cervical myelopathy (HCC), Chronic heart failure with preserved ejection fraction (HFpEF) (HCC), CKD (chronic kidney disease), stage III (HCC), COPD (chronic obstructive pulmonary disease) (HCC), Gout, Hyperlipidemia LDL goal <70, Hypertension, Morbid obesity (HCC), OSA (obstructive sleep apnea), Stress due to family tension (04/15/2019), and Tracheobronchomalacia. he  has a past surgical history that includes Cholecystectomy; Colonoscopy with propofol  (N/A, 03/10/2020); RIGHT/LEFT HEART CATH AND CORONARY ANGIOGRAPHY (N/A, 05/29/2023); CORONARY PRESSURE/FFR STUDY (N/A, 05/29/2023); Cardiac catheterization; Back surgery; Colonoscopy with propofol  (N/A, 09/25/2023); polypectomy (09/25/2023); and RIGHT/LEFT HEART CATH AND CORONARY ANGIOGRAPHY (Bilateral, 10/30/2023). Patient denies hx of cancer, stroke, seizures, unexplained weight loss, unexplained changes in bowel or bladder problems, and osteoporosis  Exercise history: no weights at home.   SUBJECTIVE STATEMENT:   Patient reports increased stiffness in his neck which gets worse when he sits for long periods of time but is better when he is up and moving  NSTEMI s/p PCI/DES LAD on 06/08/2024  Top three problems list: hand grip/holding things, tightness in B UT (driving, walking, sitting, UBE)  PAIN: NPRS: Current: 3-4/10 in upper thoracic spine and base of neck radiating to shoulders  From initial eval 05/19/24:  Best: 2/10, Worst: 10/10. Pain location: back of neck down to just past the scapulae, bilateral upper traps to tops of  shoulders, sore over the clavicles and throat to touch. Numbness and tingling bilateral hands (R > L). Numbness in the R hand 2nd and 3rd digits all the time, but can be all the fingers of the R hand. He gets shooting down the left arm to the middle finger and increased numbness spreading from that finger.  Pain description: I can feel where they cut me, muscle spasm (vibrating), choking, cramp Aggravating factors: bending, lifting, raking, driving for a long distance Relieving factors: stay active, getting out to do things he usually does   PRECAUTIONS: Other: lifting heavy stuff and cannot remember other stuff, does not want him to over do stuff, cut back on some of the stuff he is doing at the house. He mentions bed rest but also that the doctor told him to come to physical therapy. Repeatedly hospitalized for complications with his heart (most recently afib)   PATIENT GOALS: like get everything loosened up like it was when he left PT last time, and try to get the pain and tightness to quit.   NEXT MD VISIT: MRI on 05/23/2024 followed by a phone call after the results are in.  OBJECTIVE  There were no vitals filed for this visit.   TREATMENT                                                                                                                             07/23/24    Manual:  STM to suboccipitals, B UT, levator scapulae, and cervical paraspinals x 10 minutes  Upper trap stretch with GHJ overpressure 3 x 30 seconds each    Therapeutic Exercise:   Sidelying open book stretch x 10 each side  Seated B shoulder abduction 3# DB 2 x 10  Standing row with GTB 2 x 10   Putty squeezes x 1-2 minutes - provided patient with light blue extra soft putty for home use   PATIENT EDUCATION:  Education details: Update to POC, need for re-evaluation Person educated: Patient Education method: Explanation, Demonstration, Tactile cues, and Verbal cues Education comprehension: verbalized  understanding, returned demonstration, and needs further education  HOME EXERCISE PROGRAM: Access Code: [email or web address] URL: [email or web address] Date: 07/21/2024 Prepared by: Toribio Servant  Exercises - Seated Thoracic Rotation: Open Book  - 1 x daily - 7 x weekly - 3 sets - 10 reps - Seated Thoracic Rotation: Open Book (Mirrored)  - 1 x daily - 7 x weekly - 3 sets - 10 reps - Bend head forward when laying down  - 1 x daily - 7 x weekly - 3 reps - Seated Cervical Sidebending Stretch  - 1 x daily - 7 x weekly - 3 reps - 60 sec  hold - Seated Cervical Sidebending Stretch (Mirrored)  - 1 x daily - 7 x weekly - 3 reps - 60 sec  hold - Architect with Hand on Low Back  - 1 x daily - 1 sets - 10 reps - 1 breath hold - 90 seconds time practicing per set - Standing Anatomical Position with Scapular Retraction and Depression at Wall  - 1 x daily - 1 sets - 10 reps - 5 hold - 90 seconds time practicing per set - Standing Anatomical Position with Scapular Retraction and Depression at Wall  - 1 x daily - 1 sets - 10 reps - 5 hold - 90 seconds time practicing per set  ASSESSMENT:  CLINICAL IMPRESSION:   Continued PT POC focused on neck stiffness and UE weakness. Session focused on manual stretching and STM along with BUE strengthening. Provided patient with light blue extra soft putty for improving grip strength at home as he complains of inability to grip objects. Patient will continue to benefit from skilled therapy to address remaining deficits in order to improve quality of life and return to PLOF.     OBJECTIVE IMPAIRMENTS: cardiopulmonary status limiting activity, decreased activity tolerance, decreased coordination, decreased endurance, decreased knowledge of condition, decreased ROM, decreased strength, hypomobility, increased edema, increased fascial restrictions, increased muscle spasms, impaired flexibility, impaired sensation, impaired UE functional use, improper  body mechanics, postural dysfunction, obesity, and pain.   ACTIVITY LIMITATIONS: carrying, lifting, bending, sitting, bed mobility, reach over head, hygiene/grooming, and caring for others and   PARTICIPATION LIMITATIONS: meal prep, cleaning, laundry, driving, shopping, community activity, occupation, yard work, and  difficulty with usual activities such as using his tractor, being active, personal care, lifting, reading, concentration, work, driving, sleeping, reaching, holding and manipulating things such as a cup and utinsils with hands, eating, shopping, carrying groceries, and recreation  PERSONAL FACTORS: Behavior pattern, Fitness, Past/current experiences, Time since onset of injury/illness/exacerbation, and 3+ comorbidities:  Cervical myelopathy (HCC); Osteoarthritis of spine with radiculopathy, lumbar region; Hyperlipidemia associated with type 2 diabetes mellitus (HCC); Diabetes mellitus (HCC); Idiopathic chronic gout of multiple sites without tophus; Essential hypertension; Dermatitis; Class 2 severe obesity with serious comorbidity and body mass index (BMI) of 39.0 to 39.9 in adult [facility]); Chronic kidney disease (CKD) stage G2/A3, mildly decreased glomerular filtration rate (GFR) between 60-89 mL/min/1.73 square meter and albuminuria creatinine ratio greater than 300 mg/g; Proteinuria; Polyp of ascending colon; Rectal polyp; Other chronic pain; Systolic murmur; Tobacco use; S/P cervical spinal fusion; Former heavy tobacco smoker; Gout; Grade I diastolic dysfunction; LVH (left ventricular hypertrophy); Accelerated hypertension; Acute on chronic diastolic congestive heart failure (HCC); Class 2 obesity; Uncontrolled diabetes mellitus with hyperglycemia, with long-term current use of insulin  (HCC); Acute on chronic heart failure with preserved ejection fraction (HFpEF) (HCC); Hypertensive emergency; Acute respiratory failure with hypoxia (HCC); Elevated troponin; Pleuritic chest pain; Type 2 diabetes  mellitus with peripheral neuropathy (HCC); Gastroesophageal reflux disease without esophagitis; COPD with asthma (HCC); Dyslipidemia; Unstable angina (HCC); Acute on chronic diastolic heart failure (HCC); Myocardial injury; Type II diabetes mellitus with renal manifestations (HCC); Obesity (BMI 30-39.9); HLD (hyperlipidemia); Chronic diastolic CHF (congestive heart failure) (HCC); Diarrhea; CAD (coronary artery disease); CKD (chronic kidney disease) stage 2, GFR 60-89 ml/min; Degenerative disc disease, cervical; Osteoarthritis of spine with radiculopathy, cervical region; History of colon polyps; COPD exacerbation (HCC); CHF (congestive heart failure) (HCC); Acute pulmonary edema (HCC); Nicotine  dependence; Class 3 obesity; Acute on chronic diastolic (congestive) heart failure (HCC); Chronic kidney disease, stage 3a (HCC); and Stable angina (HCC) on their problem list. he  has a past medical history of Bulging of cervical intervertebral disc, CAD (  coronary artery disease), Cervical myelopathy (HCC), Chronic heart failure with preserved ejection fraction (HFpEF) (HCC), CKD (chronic kidney disease), stage III (HCC), COPD (chronic obstructive pulmonary disease) (HCC), Gout, Hyperlipidemia LDL goal <70, Hypertension, Morbid obesity (HCC), OSA (obstructive sleep apnea), Stress due to family tension (04/15/2019), and Tracheobronchomalacia. he  has a past surgical history that includes Cholecystectomy; Colonoscopy with propofol  (N/A, 03/10/2020); RIGHT/LEFT HEART CATH AND CORONARY ANGIOGRAPHY (N/A, 05/29/2023); CORONARY PRESSURE/FFR STUDY (N/A, 05/29/2023); Cardiac catheterization; Back surgery; Colonoscopy with propofol  (N/A, 09/25/2023); polypectomy (09/25/2023); and RIGHT/LEFT HEART CATH AND CORONARY ANGIOGRAPHY (Bilateral, 10/30/2023) are also affecting patient's functional outcome.   REHAB POTENTIAL: Fair due to severity and complexity of condition and past experiences. Good rehab potential to improve to where he was  at last PT discharge.   CLINICAL DECISION MAKING: Evolving/moderate complexity  EVALUATION COMPLEXITY: Moderate   GOALS: Goals reviewed with patient? No  SHORT TERM GOALS: Target date: 06/02/2024  Patient will be independent with initial home exercise program for self-management of symptoms. Baseline: Initial HEP provided at IE (05/19/24); Goal status: MET   LONG TERM GOALS: Target date: 08/11/2024. Target date updated to 10/06/2024 for all unmet goal son 07/14/2024.   Patient will be independent with a long-term home exercise program for self-management of symptoms.  Baseline: Initial HEP provided at IE (05/19/24); has not been performing since his heart attack (07/14/2024);  Goal status: Ongoing  2.  Patient will demonstrate improved Neck Disability Index (NDI) to equal or less than 10% to demonstrate improvement in overall condition and self-reported functional ability.  Baseline: 58% (05/19/24); 52% (07/14/2024);  Goal status: Ongoing  3.  Patient will demonstrate increased UE myotomes by equal or greater than 1/2 MMT or up to 4+/5 to improve his ability to use his B UE for riding the tractor and completing household activities.  Baseline: as low as 3/5  (05/19/24); similar as low as 3/5 (07/14/24);  Goal status: Ongoing  4.  Patient will demonstrate improved cervical spine rotation to 40 degrees bilaterally to maximize his use of the AA ROM and view his surroundings more easily.  Baseline: R/L 15/34 (05/19/24); R/L 30/35 (07/14/24);  Goal status: In progress  5.  Patient will demonstrate improvement in Patient Specific Functional Scale (PSFS) of equal or greater than 8/10 points to reflect clinically significant improvement in patient's most valued functional activities. Baseline: to be measured at visit 2 as appropriate (05/19/24); 3.3/10 (05/21/2024); 2.3/10 (07/14/2024);  Goal status: Ongoing    PLAN:  PT FREQUENCY: 2x/week  PT DURATION: 8-12 weeks  PLANNED  INTERVENTIONS: 97164- PT Re-evaluation, 97750- Physical Performance Testing, 97110-Therapeutic exercises, 97530- Therapeutic activity, [id number]- Neuromuscular re-education, 97535- Self Care, [id number]- Manual therapy, [id number]- Aquatic Therapy, [id number]- Electrical stimulation (unattended), 20560 (1-2 muscles), 20561 (3+ muscles)- Dry Needling, Patient/Family education, Joint mobilization, Spinal mobilization, Cryotherapy, and Moist heat  PLAN FOR NEXT SESSION: Focus on thoracic mobility exercises and functional tasks. Stretches for neck flexors and accessory breathing muscles. update HEP as appropriate, educate on mechanical stressors and go through day to help with education on alleviation of these for the short term, exercise to improve intrascapular activation and strength for more balanced force couple and decrease over-reliance on UT, interventions to improve thoracic and AA/OA mobility. education  Maryanne Finder, PT, DPT Physical Therapist - [place]  [facility]

## 2024-07-22 NOTE — Progress Notes (Deleted)
 Cardiology Office Note    Date:  07/22/2024   ID:  Cory Rivas, DOB 01/23/1966, MRN [id number]  PCP:  Cory Mole, PA-C  Cardiologist:  Cory Cave, MD  Electrophysiologist:  None   Chief Complaint: Follow-up  History of Present Illness:   Cory Rivas is a 58 y.o. male with history of ***  ***   Labs independently reviewed: 07/2024 - BUN 15, serum creatinine 1.58, potassium 4.4 06/2024 - albumin 4.2, AST/ALT normal, Hgb 16.1, PLT 226 05/2024 - TC 181, TG 208, HDL 38, LDL 101 LP(a) less than 8.4, A1c 9.6 04/2023 - TSH normal  Past Medical History:  Diagnosis Date   Bulging of cervical intervertebral disc    CAD (coronary artery disease)    a. 09/2020 MV: low risk; b. 05/2023 Cath: Moderate LAD dzs w/ seq 50-60% stenoses and nl RFR 0.93. Mod OM dzs->Med rx; b. 10/2023 Cath: LM min irregs, LAD 55ost, 55p/m, LCX large nl, OM1 50, RCA mod, nl.   Cervical myelopathy (HCC)    Chronic heart failure with preserved ejection fraction (HFpEF) (HCC)    a. 05/2023 Echo: EF 50-55%, no rwma, GrIII DD, nl RV fxn, mild MR, Ao sclerosis; b. 10/2023 Echo: EF 55-60%, no rwma, nl RV fxn, mild MR, Ao sclerosis.   CKD (chronic kidney disease), stage III (HCC)    COPD (chronic obstructive pulmonary disease) (HCC)    Diabetes mellitus without complication (HCC)    Gout    Hyperlipidemia LDL goal <70    Hypertension    Morbid obesity (HCC)    OSA (obstructive sleep apnea)    Stress due to family tension 04/15/2019   Tracheobronchomalacia     Past Surgical History:  Procedure Laterality Date   BACK SURGERY     CARDIAC CATHETERIZATION     CHOLECYSTECTOMY     COLONOSCOPY WITH PROPOFOL  N/A 03/10/2020   Procedure: COLONOSCOPY WITH PROPOFOL ;  Surgeon: Cory Carmine, MD;  Location: [facility];  Service: Endoscopy;  Laterality: N/A;   COLONOSCOPY WITH PROPOFOL  N/A 09/25/2023   Procedure: COLONOSCOPY WITH PROPOFOL ;  Surgeon: Cory Carmine, MD;  Location: [facility];  Service:  Endoscopy;  Laterality: N/A;   CORONARY IMAGING/OCT N/A 06/08/2024   Procedure: CORONARY IMAGING/OCT;  Surgeon: Cory Deatrice LABOR, MD;  Location: [facility];  Service: Cardiovascular;  Laterality: N/A;   CORONARY PRESSURE/FFR STUDY N/A 05/29/2023   Procedure: CORONARY PRESSURE/FFR STUDY;  Surgeon: Cory Bruckner, MD;  Location: [facility];  Service: Cardiovascular;  Laterality: N/A;   CORONARY STENT INTERVENTION N/A 06/08/2024   Procedure: CORONARY STENT INTERVENTION;  Surgeon: Cory Deatrice LABOR, MD;  Location: [facility];  Service: Cardiovascular;  Laterality: N/A;   LEFT HEART CATH AND CORONARY ANGIOGRAPHY N/A 06/08/2024   Procedure: LEFT HEART CATH AND CORONARY ANGIOGRAPHY;  Surgeon: Cory Deatrice LABOR, MD;  Location: [facility];  Service: Cardiovascular;  Laterality: N/A;   POLYPECTOMY  09/25/2023   Procedure: POLYPECTOMY;  Surgeon: Cory Carmine, MD;  Location: [facility];  Service: Endoscopy;;   RIGHT/LEFT HEART CATH AND CORONARY ANGIOGRAPHY N/A 05/29/2023   Procedure: RIGHT/LEFT HEART CATH AND CORONARY ANGIOGRAPHY;  Surgeon: Cory Bruckner, MD;  Location: [facility];  Service: Cardiovascular;  Laterality: N/A;   RIGHT/LEFT HEART CATH AND CORONARY ANGIOGRAPHY Bilateral 10/30/2023   Procedure: RIGHT/LEFT HEART CATH AND CORONARY ANGIOGRAPHY;  Surgeon: Cory Bruckner, MD;  Location: [facility];  Service: Cardiovascular;  Laterality: Bilateral;    Current Medications: No outpatient medications  have been marked as taking for the 07/24/24 encounter (Appointment) with Cory Bernardino HERO, PA-C.    Allergies:   Oxycodone , Entresto  [sacubitril -valsartan ], Onion, and Nitroglycerin    Social History   Socioeconomic History   Marital status: Divorced    Spouse name: Not on file   Number of children: 0   Years of education: 9   Highest education level: 9th grade  Occupational History   Occupation: disability  Tobacco Use   Smoking  status: Every Day    Current packs/day: 0.50    Average packs/day: 1 pack/day for 38.3 years (37.7 ttl pk-yrs)    Types: Cigarettes    Start date: 10/19/1989    Last attempt to quit: 08/20/2022   Smokeless tobacco: Never   Tobacco comments:    30+ years hx smoking 2 ppd, stopped smoking briefly in April 2022 for a surgery and then restarted smoking.  As of August 2025, he smoking about 2 to 4 cigarettes a day.  Vaping Use   Vaping status: Never Used  Substance and Sexual Activity   Alcohol use: No   Drug use: No   Sexual activity: Not Currently    Partners: Female  Other Topics Concern   Not on file  Social History Narrative   Lives with sister Cory Rivas, brother in law Cory Rivas), and his mother - Cory Rivas   Does not routinely exercise.   Social Drivers of Rivas   Financial Resource Strain: High Risk (01/21/2024)   Overall Financial Resource Strain (CARDIA)    Difficulty of Paying Living Expenses: Hard  Food Insecurity: No Food Insecurity (04/04/2024)   Hunger Vital Sign    Worried About Running Out of Food in the Last Year: Never true    Ran Out of Food in the Last Year: Never true  Transportation Needs: No Transportation Needs (04/04/2024)   PRAPARE - Administrator, Civil Service (Medical): No    Lack of Transportation (Non-Medical): No  Physical Activity: Insufficiently Active (12/06/2022)   Exercise Vital Sign    Days of Exercise per Week: 3 days    Minutes of Exercise per Session: 10 min  Stress: No Stress Concern Present (12/06/2022)   Cory Rivas - Occupational Stress Questionnaire    Feeling of Stress : Only a little  Social Connections: Socially Isolated (02/17/2024)   Social Connection and Isolation Panel    Frequency of Communication with Friends and Family: More than three times a week    Frequency of Social Gatherings with Friends and Family: Twice a week    Attends Religious Services: Never    Database administrator or  Organizations: No    Attends Engineer, structural: Never    Marital Status: Divorced     Family History:  The patient's family history includes AAA (abdominal aortic aneurysm) in his maternal grandmother; Diabetes in his maternal grandfather and mother; Heart attack in his maternal grandmother and mother; Heart disease in his mother; Hyperlipidemia in his mother; Hypertension in his father and mother; Lung cancer in his father; Stroke in his father.  ROS:   12-point review of systems is negative unless otherwise noted in the HPI.   EKGs/Labs/Other Studies Reviewed:    Studies reviewed were summarized above. The additional studies were reviewed today:  LHC 06/08/2024:   Ost LAD lesion is 55% stenosed.   1st Mrg lesion is 60% stenosed.   Prox LAD to Mid LAD lesion is 95% stenosed.   1st Diag lesion is  60% stenosed.   A drug-eluting stent was successfully placed using a STENT ONYX FRONTIER 3.0X15.   Balloon angioplasty was performed using a BALLOON TREK RX 2.5X12.   Post intervention, there is a 0% residual stenosis.   Post intervention, there is a 10% residual stenosis.   In the absence of any other complications or medical issues, we expect the patient to be ready for discharge from an interventional cardiology perspective on 06/09/2024.   Recommend uninterrupted dual antiplatelet therapy with Aspirin  81mg  daily and Prasugrel  10mg  daily for a minimum of 12 months (ACS-Class I recommendation).   1.  Severe one-vessel coronary artery disease with evidence of hazy stenosis in the proximal LAD at the bifurcation of a large diagonal branch likely due to plaque rupture.  Stable moderate ostial LAD and OM1 stenosis. 2.  Left ventricular angiography was not performed.  EF was normal by echo. 3.  Moderately severely elevated left ventricular end-diastolic pressure at 28 to 30 mmHg. 4.  Difficult but successful OCT guided angioplasty and drug-eluting stent placement to the LAD with balloon  angioplasty to the ostial diagonal .  Difficult procedure overall due to bifurcation involvement and significant difficulty in wiring the LAD due to steep angulation after the diagonal origin.   Recommendations: See recommendations below.  Continue aggressive treatment of risk factors. I stopped IV fluids and can resume oral diuretic likely tomorrow. __________  2D echo 06/08/2024: 1. Left ventricular ejection fraction, by estimation, is 55 to 60%. The  left ventricle has normal function. The left ventricle has no regional  wall motion abnormalities. There is moderate left ventricular hypertrophy.  Left ventricular diastolic  parameters are consistent with Grade II diastolic dysfunction  (pseudonormalization).   2. Right ventricular systolic function is normal. The right ventricular  size is normal. Tricuspid regurgitation signal is inadequate for assessing  PA pressure.   3. Left atrial size was mildly dilated.   4. The mitral valve is normal in structure. Mild mitral valve  regurgitation. No evidence of mitral stenosis.   5. The aortic valve is normal in structure. Aortic valve regurgitation is  not visualized. Aortic valve sclerosis/calcification is present, without  any evidence of aortic stenosis.  __________  2D echo 11/12/2023: 1. Left ventricular ejection fraction, by estimation, is 55 to 60%. The  left ventricle has normal function. The left ventricle has no regional  wall motion abnormalities. Left ventricular diastolic parameters were  normal. The average left ventricular  global longitudinal strain is -13.3 %.   2. Right ventricular systolic function is normal. The right ventricular  size is normal. Tricuspid regurgitation signal is inadequate for assessing  PA pressure.   3. The mitral valve is normal in structure. Mild mitral valve  regurgitation. No evidence of mitral stenosis.   4. The aortic valve is normal in structure. There is mild calcification  of the aortic  valve. Aortic valve regurgitation is not visualized. Aortic  valve sclerosis is present, with no evidence of aortic valve stenosis.   5. The inferior vena cava is normal in size with greater than 50%  respiratory variability, suggesting right atrial pressure of 3 mmHg.  __________   [facility] 10/30/2023: Conclusions: Moderate LAD and OM1 disease, not significantly different compared to prior catheterization in 05/2023.  RFR of sequential ostial and proximal LAD stenosis at that time was not hemodynamically significant. Normal left and right heart filling pressures. Mild pulmonary hypertension. Normal Fick cardiac output/index.   Recommendations: Increase amlodipine  to 10 mg daily for BP  control and antianginal therapy. Consider resumption of lisinopril  +/- spironolactone  at follow-up based on blood pressure and renal function. Continue aggressive secondary prevention of coronary artery disease. __________   [facility] 05/29/2023: Conclusions: Moderate, nonobstructive coronary artery disease with sequential 50-60% ostial and proximal LAD stenoses that are not hemodynamically significant (RFR = 0.93).  There is also a 40% ostial stenosis of a large first OM branch.  Nondominant RCA is without significant disease. Upper normal to mildly elevated left and right heart filling pressures. Mild pulmonary hypertension. Borderline low Fick cardiac output/index. Mild aortic stenosis.   Recommendations: Resume gentle diuresis tomorrow if renal function is stable. Medical therapy and risk factor modification to prevent progression of coronary artery disease. __________   2D echo 05/28/2023: 1. Left ventricular ejection fraction, by estimation, is 50 to 55%. The  left ventricle has low normal function. The left ventricle has no regional  wall motion abnormalities. There is moderate left ventricular hypertrophy.  Left ventricular diastolic  parameters are consistent with Grade III diastolic dysfunction   (restrictive). Elevated left atrial pressure. The average left ventricular  global longitudinal strain is -10.0 %. The global longitudinal strain is  abnormal.   2. Right ventricular systolic function is normal. The right ventricular  size is normal. Tricuspid regurgitation signal is inadequate for assessing  PA pressure.   3. Left atrial size was mildly dilated.   4. The mitral valve was not well visualized. Mild mitral valve  regurgitation.   5. The aortic valve has an indeterminant number of cusps. There is  moderate calcification of the aortic valve. There is moderate thickening  of the aortic valve. Aortic valve regurgitation is not visualized. Aortic  valve sclerosis/calcification is  present, without any evidence of aortic stenosis.   6. The inferior vena cava is normal in size with greater than 50%  respiratory variability, suggesting right atrial pressure of 3 mmHg.  __________   2D echo 08/23/2022: 1. Left ventricular ejection fraction, by estimation, is 60 to 65%. The  left ventricle has normal function. The left ventricle has no regional  wall motion abnormalities. There is mild left ventricular hypertrophy.  Left ventricular diastolic parameters  are consistent with Grade II diastolic dysfunction (pseudonormalization).   2. Right ventricular systolic function is normal. The right ventricular  size is normal.   3. The mitral valve is normal in structure. Mild mitral valve  regurgitation. No evidence of mitral stenosis.   4. The aortic valve is calcified. Aortic valve regurgitation is not  visualized. Aortic valve sclerosis/calcification is present, without any  evidence of aortic stenosis.   5. The inferior vena cava is normal in size with greater than 50%  respiratory variability, suggesting right atrial pressure of 3 mmHg.  __________   2D echo 11/04/2020: 1. Left ventricular ejection fraction, by estimation, is 60 to 65%. The  left ventricle has normal function.  The left ventricle has no regional  wall motion abnormalities. Left ventricular diastolic parameters are  consistent with Grade I diastolic  dysfunction (impaired relaxation).There is mild left ventricular  hypertrophy of the basal segment. LVOT gradient of 10 mm Hg at rest, 22 mm  Hg gradient with valsalva   2. Right ventricular systolic function is normal. The right ventricular  size is normal. There is normal pulmonary artery systolic pressure. The  estimated right ventricular systolic pressure is 31.6 mmHg.   3. Left atrial size was mildly dilated.  ____________   Lexiscan  MPI 10/10/2020: Low risk, probably normal pharmacologic myocardial perfusion  stress tst. There is a small in size, moderate in severity, fixed defect at the apex most likely representing artifact (apical thinning and attenuation) and less likely scar. There is no evidence of significant ischemia. The left ventricular ejection fraction is normal (59%). There is no significant coronary artery calcification on the attenuation correction CT.   EKG:  EKG is ordered today.  The EKG ordered today demonstrates ***  Recent Labs: 02/19/2024: Magnesium  2.5 05/17/2024: B Natriuretic Peptide 32.4 07/07/2024: ALT 18; Hemoglobin 16.1; Platelets 226 07/21/2024: BUN 15; Creatinine, Ser 1.58; Potassium 4.4; Sodium 138  Recent Lipid Panel    Component Value Date/Time   CHOL 181 06/08/2024 0622   CHOL 178 11/15/2023 0943   TRIG 208 (H) 06/08/2024 0622   HDL 38 (L) 06/08/2024 0622   HDL 41 11/15/2023 0943   CHOLHDL 4.8 06/08/2024 0622   VLDL 42 (H) 06/08/2024 0622   LDLCALC 101 (H) 06/08/2024 0622   LDLCALC 101 (H) 11/15/2023 0943   LDLCALC  08/09/2023 1156     Comment:     . LDL cholesterol not calculated. Triglyceride levels greater than 400 mg/dL invalidate calculated LDL results. . Reference range: <100 . Desirable range <100 mg/dL for primary prevention;   <70 mg/dL for patients with CHD or diabetic patients  with >  or = 2 CHD risk factors. SABRA LDL-C is now calculated using the Martin-Hopkins  calculation, which is a validated novel method providing  better accuracy than the Friedewald equation in the  estimation of LDL-C.  Gladis APPLETHWAITE et al. SANDREA. 7986;689(80): 2061-2068  (http://education.[employer].com/faq/FAQ164)    LDLDIRECT 106 (H) 11/15/2023 9056   LDLDIRECT 147 (H) 08/19/2023 2222    PHYSICAL EXAM:    VS:  There were no vitals taken for this visit.  BMI: There is no height or weight on file to calculate BMI.  Physical Exam  Wt Readings from Last 3 Encounters:  07/21/24 225 lb (102.1 kg)  07/17/24 219 lb 12.8 oz (99.7 kg)  07/13/24 223 lb 12.8 oz (101.5 kg)     ASSESSMENT & PLAN:   CAD involving the native coronary arteries with stable angina:  HFpEF:  Pulmonary hypertension:  HTN: Blood pressure  HLD: LDL  CKD stage IIIa:  Obesity with asthma, OSA, and tracheobronchiomalacia:  Tobacco use:   {Are you ordering a CV Procedure (e.g. stress test, cath, DCCV, TEE, etc)?   Press F2        :[id number]}     Disposition: F/u with Dr. Darliss or an APP in ***.   Medication Adjustments/Labs and Tests Ordered: Current medicines are reviewed at length with the patient today.  Concerns regarding medicines are outlined above. Medication changes, Labs and Tests ordered today are summarized above and listed in the Patient Instructions accessible in Encounters.   Signed, Bernardino Bring, PA-C 07/22/2024 9:42 AM     [facility] [place] [address] [place], [place] [address] [phone]

## 2024-07-23 ENCOUNTER — Ambulatory Visit

## 2024-07-23 DIAGNOSIS — M62838 Other muscle spasm: Secondary | ICD-10-CM | POA: Diagnosis not present

## 2024-07-23 DIAGNOSIS — M5412 Radiculopathy, cervical region: Secondary | ICD-10-CM | POA: Diagnosis not present

## 2024-07-23 DIAGNOSIS — M6281 Muscle weakness (generalized): Secondary | ICD-10-CM | POA: Diagnosis not present

## 2024-07-23 DIAGNOSIS — M542 Cervicalgia: Secondary | ICD-10-CM

## 2024-07-23 DIAGNOSIS — R293 Abnormal posture: Secondary | ICD-10-CM | POA: Diagnosis not present

## 2024-07-24 ENCOUNTER — Ambulatory Visit: Admitting: Physician Assistant

## 2024-07-28 ENCOUNTER — Ambulatory Visit

## 2024-07-28 DIAGNOSIS — R293 Abnormal posture: Secondary | ICD-10-CM | POA: Diagnosis not present

## 2024-07-28 DIAGNOSIS — M5412 Radiculopathy, cervical region: Secondary | ICD-10-CM

## 2024-07-28 DIAGNOSIS — M62838 Other muscle spasm: Secondary | ICD-10-CM | POA: Diagnosis not present

## 2024-07-28 DIAGNOSIS — M542 Cervicalgia: Secondary | ICD-10-CM

## 2024-07-28 DIAGNOSIS — M6281 Muscle weakness (generalized): Secondary | ICD-10-CM

## 2024-07-28 NOTE — Therapy (Signed)
 OUTPATIENT PHYSICAL THERAPY TREATMENT And Progress Report (05/19/2024 - 07/28/2024)   Patient Name: Cory Rivas MRN: [id number] DOB:[birth year], 58 y.o., male Today's Date: 07/28/2024  END OF SESSION:  PT End of Session - 07/28/24 1034     Visit Number 10    Number of Visits 17    Date for Recertification  10/06/24    Authorization Type [place] MEDICAID HE[employer]Y BLUE reporting period from 05/19/2024    Authorization Time Period HB auth#[id number] for 13 PT vsts from 8/7-11/5    Authorization - Number of Visits 13    Progress Note Due on Visit 10    PT Start Time 1034    PT Stop Time 1114    PT Time Calculation (min) 40 min    Activity Tolerance Patient tolerated treatment well    Behavior During Therapy [facility] for tasks assessed/performed                   Past Medical History:  Diagnosis Date   Bulging of cervical intervertebral disc    CAD (coronary artery disease)    a. 09/2020 MV: low risk; b. 05/2023 Cath: Moderate LAD dzs w/ seq 50-60% stenoses and nl RFR 0.93. Mod OM dzs->Med rx; b. 10/2023 Cath: LM min irregs, LAD 55ost, 55p/m, LCX large nl, OM1 50, RCA mod, nl.   Cervical myelopathy (HCC)    Chronic heart failure with preserved ejection fraction (HFpEF) (HCC)    a. 05/2023 Echo: EF 50-55%, no rwma, GrIII DD, nl RV fxn, mild MR, Ao sclerosis; b. 10/2023 Echo: EF 55-60%, no rwma, nl RV fxn, mild MR, Ao sclerosis.   CKD (chronic kidney disease), stage III (HCC)    COPD (chronic obstructive pulmonary disease) (HCC)    Diabetes mellitus without complication (HCC)    Gout    Hyperlipidemia LDL goal <70    Hypertension    Morbid obesity (HCC)    OSA (obstructive sleep apnea)    Stress due to family tension 04/15/2019   Tracheobronchomalacia    Past Surgical History:  Procedure Laterality Date   BACK SURGERY     CARDIAC CATHETERIZATION     CHOLECYSTECTOMY     COLONOSCOPY WITH PROPOFOL  N/A 03/10/2020   Procedure: COLONOSCOPY WITH PROPOFOL ;  Surgeon: Jinny Carmine, MD;   Location: [facility]OS[facility];  Service: Endoscopy;  Laterality: N/A;   COLONOSCOPY WITH PROPOFOL  N/A 09/25/2023   Procedure: COLONOSCOPY WITH PROPOFOL ;  Surgeon: Jinny Carmine, MD;  Location: [facility]OS[facility];  Service: Endoscopy;  Laterality: N/A;   CORONARY IMAGING/OCT N/A 06/08/2024   Procedure: CORONARY IMAGING/OCT;  Surgeon: Darron Deatrice LABOR, MD;  Location: [facility] INVAS[facility];  Service: Cardiovascular;  Laterality: N/A;   CORONARY PRESSURE/FFR STUDY N/A 05/29/2023   Procedure: CORONARY PRESSURE/FFR STUDY;  Surgeon: Mady Bruckner, MD;  Location: [facility] CV [facility];  Service: Cardiovascular;  Laterality: N/A;   CORONARY STENT INTERVENTION N/A 06/08/2024   Procedure: CORONARY STENT INTERVENTION;  Surgeon: Darron Deatrice LABOR, MD;  Location: [facility];  Service: Cardiovascular;  Laterality: N/A;   LEFT HEART CATH AND CORONARY ANGIOGRAPHY N/A 06/08/2024   Procedure: LEFT HEART CATH AND CORONARY ANGIOGRAPHY;  Surgeon: Darron Deatrice LABOR, MD;  Location: [facility];  Service: Cardiovascular;  Laterality: N/A;   POLYPECTOMY  09/25/2023   Procedure: POLYPECTOMY;  Surgeon: Jinny Carmine, MD;  Location: [facility];  Service: Endoscopy;;   RIGHT/LEFT HEART CATH AND CORONARY ANGIOGRAPHY N/A 05/29/2023   Procedure: RIGHT/LEFT HEART CATH AND CORONARY ANGIOGRAPHY;  Surgeon: Mady Bruckner, MD;  Location: [facility];  Service: Cardiovascular;  Laterality: N/A;   RIGHT/LEFT HEART CATH AND CORONARY ANGIOGRAPHY Bilateral 10/30/2023   Procedure: RIGHT/LEFT HEART CATH AND CORONARY ANGIOGRAPHY;  Surgeon: Mady Bruckner, MD;  Location: [facility];  Service: Cardiovascular;  Laterality: Bilateral;   Patient Active Problem List   Diagnosis Date Noted   Chest pain 06/08/2024   Hypertensive urgency 06/08/2024   Non-ST elevation (NSTEMI) myocardial infarction (HCC) 06/08/2024   Chronic kidney disease, stage 3a (HCC) 05/17/2024   Stable angina 05/17/2024   Acute on chronic  diastolic (congestive) heart failure (HCC) 04/03/2024   Class 3 obesity (HCC) 02/18/2024   Nicotine  dependence 01/23/2024   Acute pulmonary edema (HCC) 01/22/2024   CHF (congestive heart failure) (HCC) 01/20/2024   COPD exacerbation (HCC) 10/21/2023   History of colon polyps 09/25/2023   Myocardial injury 08/19/2023   Type II diabetes mellitus with renal manifestations (HCC) 08/19/2023   Obesity (BMI 30-39.9) 08/19/2023   HLD (hyperlipidemia) 08/19/2023   Chronic diastolic CHF (congestive heart failure) (HCC) 08/19/2023   Diarrhea 08/19/2023   CAD (coronary artery disease) 08/19/2023   CKD (chronic kidney disease) stage 2, GFR 60-89 ml/min 08/19/2023   Acute on chronic diastolic heart failure (HCC) 05/30/2023   Unstable angina (HCC) 05/29/2023   Pleuritic chest pain 05/27/2023   Type 2 diabetes mellitus with peripheral neuropathy (HCC) 05/27/2023   Gastroesophageal reflux disease without esophagitis 05/27/2023   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   Dyslipidemia 05/27/2023   Hypertensive emergency 11/22/2022   Acute respiratory failure with hypoxia (HCC) 11/22/2022   Elevated troponin 11/22/2022   Chronic heart failure with preserved ejection fraction (HFpEF) (HCC) 08/24/2022   Acute on chronic diastolic congestive heart failure (HCC) 08/23/2022   Class 2 obesity 08/23/2022   Uncontrolled diabetes mellitus with hyperglycemia, with long-term current use of insulin  (HCC) 08/23/2022   Grade I diastolic dysfunction 06/08/2022   LVH (left ventricular hypertrophy) 06/08/2022   Accelerated hypertension 06/08/2022   S/P cervical spinal fusion 08/18/2021   Former heavy tobacco smoker 08/18/2021   Gout 08/18/2021   Other chronic pain 10/26/2020   Systolic murmur 10/26/2020   Tobacco use 10/26/2020   Polyp of ascending colon    Rectal polyp    Class 2 severe obesity with serious comorbidity and body mass index (BMI) of 39.0 to 39.9 in adult 04/15/2019   Chronic kidney disease (CKD) stage  G2/A3, mildly decreased glomerular filtration rate (GFR) between 60-89 mL/min/1.73 square meter and albuminuria creatinine ratio greater than 300 mg/g 04/15/2019   Proteinuria 04/15/2019   Hyperlipidemia associated with type 2 diabetes mellitus (HCC) 03/18/2019   Diabetes mellitus (HCC) 03/18/2019   Idiopathic chronic gout of multiple sites without tophus 03/18/2019   Essential hypertension 03/18/2019   Dermatitis 03/18/2019   Cervical myelopathy (HCC) 11/22/2015   Osteoarthritis of spine with radiculopathy, lumbar region 11/22/2015   Degenerative disc disease, cervical 11/22/2015   Osteoarthritis of spine with radiculopathy, cervical region 11/22/2015    PCP: Leavy Mole, PA-C  REFERRING PROVIDER: Corrothers, Duwaine Norris, NP (neurosurgery)  REFERRING DIAG:  s/p cervical spinal fusion, neck pain, cervicalgia  THERAPY DIAG:  Cervicalgia  Radiculopathy, cervical region  Muscle weakness (generalized)  Rationale for Evaluation and Treatment: Rehabilitation  ONSET DATE: chronic over many years but underwent C2-6 posterior laminectomy with C2-T2 instrumentation and fusion with Dr. Salina in April 2022   SUBJECTIVE:  PERTINENT HISTORY:  Patient is a 58 y.o. male who presents to outpatient physical therapy with a referral for medical diagnosis  s/p cervical spinal fusion, neck pain, cervicalgia. This patient's chief complaints consist of chronic neck pain and stiffness, UE paresthesia, weakness, and incoordination leading to the following functional deficits: difficulty with usual activities such as using his tractor, being active, personal care, lifting, reading, concentration, work, driving, sleeping, reaching, holding and manipulating things such as a cup and utinsils with hands, eating,  shopping, carrying groceries, and recreation. Relevant past medical history and comorbidities include the following: he has NSTEMI s/p PCI/DES LAD on 06/08/2024; Lumbar degenerative disc disease; Cervical myelopathy (HCC); Osteoarthritis of spine with radiculopathy, lumbar region; Hyperlipidemia associated with type 2 diabetes mellitus (HCC); Diabetes mellitus (HCC); Idiopathic chronic gout of multiple sites without tophus; Essential hypertension; Dermatitis; Class 2 severe obesity with serious comorbidity and body mass index (BMI) of 39.0 to 39.9 in adult [facility]); Chronic kidney disease (CKD) stage G2/A3, mildly decreased glomerular filtration rate (GFR) between 60-89 mL/min/1.73 square meter and albuminuria creatinine ratio greater than 300 mg/g; Proteinuria; Polyp of ascending colon; Rectal polyp; Other chronic pain; Systolic murmur; Tobacco use; S/P cervical spinal fusion; Former heavy tobacco smoker; Gout; Grade I diastolic dysfunction; LVH (left ventricular hypertrophy); Accelerated hypertension; Acute on chronic diastolic congestive heart failure (HCC); Class 2 obesity; Uncontrolled diabetes mellitus with hyperglycemia, with long-term current use of insulin  (HCC); Acute on chronic heart failure with preserved ejection fraction (HFpEF) (HCC); Hypertensive emergency; Acute respiratory failure with hypoxia (HCC); Elevated troponin; Pleuritic chest pain; Type 2 diabetes mellitus with peripheral neuropathy (HCC); Gastroesophageal reflux disease without esophagitis; COPD with asthma (HCC); Dyslipidemia; Unstable angina (HCC); Acute on chronic diastolic heart failure (HCC); Myocardial injury; Type II diabetes mellitus with renal manifestations (HCC); Obesity (BMI 30-39.9); HLD (hyperlipidemia); Chronic diastolic CHF (congestive heart failure) (HCC); Diarrhea; CAD (coronary artery disease); CKD (chronic kidney disease) stage 2, GFR 60-89 ml/min; Degenerative disc disease, cervical; Osteoarthritis of spine with  radiculopathy, cervical region; History of colon polyps; COPD exacerbation (HCC); CHF (congestive heart failure) (HCC); Acute pulmonary edema (HCC); Nicotine  dependence; Class 3 obesity; Acute on chronic diastolic (congestive) heart failure (HCC); Chronic kidney disease, stage 3a (HCC); and Stable angina (HCC) on their problem list. he  has a past medical history of Bulging of cervical intervertebral disc, CAD (coronary artery disease), Cervical myelopathy (HCC), Chronic heart failure with preserved ejection fraction (HFpEF) (HCC), CKD (chronic kidney disease), stage III (HCC), COPD (chronic obstructive pulmonary disease) (HCC), Gout, Hyperlipidemia LDL goal <70, Hypertension, Morbid obesity (HCC), OSA (obstructive sleep apnea), Stress due to family tension (04/15/2019), and Tracheobronchomalacia. he  has a past surgical history that includes Cholecystectomy; Colonoscopy with propofol  (N/A, 03/10/2020); RIGHT/LEFT HEART CATH AND CORONARY ANGIOGRAPHY (N/A, 05/29/2023); CORONARY PRESSURE/FFR STUDY (N/A, 05/29/2023); Cardiac catheterization; Back surgery; Colonoscopy with propofol  (N/A, 09/25/2023); polypectomy (09/25/2023); and RIGHT/LEFT HEART CATH AND CORONARY ANGIOGRAPHY (Bilateral, 10/30/2023). Patient denies hx of cancer, stroke, seizures, unexplained weight loss, unexplained changes in bowel or bladder problems, and osteoporosis  Exercise history: no weights at home.   SUBJECTIVE STATEMENT:    The back of his neck is so tight. Could not look up yesterday. No pain currently, just real tight.     NSTEMI s/p PCI/DES LAD on 06/08/2024  Top three problems list: hand grip/holding things, tightness in B UT (driving, walking, sitting, UBE)  PAIN: NPRS: Current: 3-4/10 in upper thoracic spine and base of neck radiating to shoulders  From initial eval 05/19/24:  Best: 2/10, Worst:  10/10. Pain location: back of neck down to just past the scapulae, bilateral upper traps to tops of shoulders, sore over the  clavicles and throat to touch. Numbness and tingling bilateral hands (R > L). Numbness in the R hand 2nd and 3rd digits all the time, but can be all the fingers of the R hand. He gets shooting down the left arm to the middle finger and increased numbness spreading from that finger.  Pain description: I can feel where they cut me, muscle spasm (vibrating), choking, cramp Aggravating factors: bending, lifting, raking, driving for a long distance Relieving factors: stay active, getting out to do things he usually does   PRECAUTIONS: Other: lifting heavy stuff and cannot remember other stuff, does not want him to over do stuff, cut back on some of the stuff he is doing at the house. He mentions bed rest but also that the doctor told him to come to physical therapy. Repeatedly hospitalized for complications with his heart (most recently afib)   PATIENT GOALS: like get everything loosened up like it was when he left PT last time, and try to get the pain and tightness to quit.   NEXT MD VISIT: MRI on 05/23/2024 followed by a phone call after the results are in.  OBJECTIVE  There were no vitals filed for this visit.  MUSCLE PERFORMANCE (MYOTOME/MMT):  *Indicates pain 05/19/24 07/14/24 07/28/2024  Joint/Motion R/L R/L R/L  Shoulder        Flexion / / /  Abduction (C5) / / /  External rotation (C5) 4+/4 4+/4+ 4+/4+  Internal rotation / / /  Extension / / /  Periscapular        Upper Trap / / /  Middle Trap / / /  Lower Trap / / /  Elbow        Flexion (C6) 4+/4 4+/4- 4+/4+  Extension (C7) 3/4 3/4 4-/4  Wrist        Flexion (C7) / / /  Extension (C6) / / /  Radial deviation / / /  Ulnar deviation (C8) / / /  Pronation / / /  Supination / / /  Hand        MCP joint ext (C7) 3/3+ 3/4  4-/ 4  Thumb extension (C8) 4/3 4+/4+ 4+/4  5th finger abduction  (T1) 4+/4+ 4+/4+ 4/4-  Finger adduction (C8)   4+/4+ /     TREATMENT                                                                                                                              07/28/24      Neuromuscular re education:  Performed with the intent on utilizing reciprocal inhibition to deactivate muscle tension to neck.    Blood pressure L arm sitting, normal cuff, mechanically taken: 150/71, HR 55.   Seated manually resisted R and L myotomes 1x each way for each UE.   Seated B  scapular retraction   Manually resisted, targeting the lower trap muscles    R 10x5 seconds for 3 sets   L 10x5 seconds for 3 sets    Decreased B neck tightness   Seated B scapular depression isometrics   L 10x5 seconds  Difficulty with glenohumeral dissociation.   Improved neck comfort level reported  Seated L scapular retraction and posterior tipped position  Small movements  L shoulder flexion, cues to decrease protraction and shrug compensation 10x  R shoulder flexion 10x   Improved exercise technique, movement at target joints, use of target muscles after mod verbal, visual, tactile cues.      PATIENT EDUCATION:  Education details: Update to POC, need for re-evaluation Person educated: Patient Education method: Explanation, Demonstration, Tactile cues, and Verbal cues Education comprehension: verbalized understanding, returned demonstration, and needs further education  HOME EXERCISE PROGRAM: Access Code: [email or web address] URL: [email or web address] Date: 07/21/2024 Prepared by: Toribio Servant  Exercises - Seated Thoracic Rotation: Open Book  - 1 x daily - 7 x weekly - 3 sets - 10 reps - Seated Thoracic Rotation: Open Book (Mirrored)  - 1 x daily - 7 x weekly - 3 sets - 10 reps - Bend head forward when laying down  - 1 x daily - 7 x weekly - 3 reps - Seated Cervical Sidebending Stretch  - 1 x daily - 7 x weekly - 3 reps - 60 sec  hold - Seated Cervical Sidebending Stretch (Mirrored)  - 1 x daily - 7 x weekly - 3 reps - 60 sec  hold - Quadruped Thoracic Rotation with Hand on Low Back  - 1 x daily - 1 sets  - 10 reps - 1 breath hold - 90 seconds time practicing per set - Standing Anatomical Position with Scapular Retraction and Depression at Wall  - 1 x daily - 1 sets - 10 reps - 5 hold - 90 seconds time practicing per set - Standing Anatomical Position with Scapular Retraction and Depression at Wall  - 1 x daily - 1 sets - 10 reps - 5 hold - 90 seconds time practicing per set  ASSESSMENT:  CLINICAL IMPRESSION:  Pt demonstrates some improvement in function, strength, and cervical rotation AROM since initial evaluation. Pt making progress with PT towards goals. Challenges to progress include cardiac history.   Utilized reciprocal inhibition to decrease B upper trap, scalene and cervical paraspinal muscle tension to his neck. Decreased neck tightness and improved comfort level reported after treatment. Neck feels a whole lot better reported.  Patient will continue to benefit from skilled therapy to address remaining deficits in order to improve quality of life and return to PLOF.       OBJECTIVE IMPAIRMENTS: cardiopulmonary status limiting activity, decreased activity tolerance, decreased coordination, decreased endurance, decreased knowledge of condition, decreased ROM, decreased strength, hypomobility, increased edema, increased fascial restrictions, increased muscle spasms, impaired flexibility, impaired sensation, impaired UE functional use, improper body mechanics, postural dysfunction, obesity, and pain.   ACTIVITY LIMITATIONS: carrying, lifting, bending, sitting, bed mobility, reach over head, hygiene/grooming, and caring for others and   PARTICIPATION LIMITATIONS: meal prep, cleaning, laundry, driving, shopping, community activity, occupation, yard work, and  difficulty with usual activities such as using his tractor, being active, personal care, lifting, reading, concentration, work, driving, sleeping, reaching, holding and manipulating things such as a cup and utinsils with hands, eating,  shopping, carrying groceries, and recreation  PERSONAL FACTORS: Behavior pattern, Fitness, Past/current experiences, Time since onset of injury/illness/exacerbation, and 3+  comorbidities:  Cervical myelopathy (HCC); Osteoarthritis of spine with radiculopathy, lumbar region; Hyperlipidemia associated with type 2 diabetes mellitus (HCC); Diabetes mellitus (HCC); Idiopathic chronic gout of multiple sites without tophus; Essential hypertension; Dermatitis; Class 2 severe obesity with serious comorbidity and body mass index (BMI) of 39.0 to 39.9 in adult [facility]); Chronic kidney disease (CKD) stage G2/A3, mildly decreased glomerular filtration rate (GFR) between 60-89 mL/min/1.73 square meter and albuminuria creatinine ratio greater than 300 mg/g; Proteinuria; Polyp of ascending colon; Rectal polyp; Other chronic pain; Systolic murmur; Tobacco use; S/P cervical spinal fusion; Former heavy tobacco smoker; Gout; Grade I diastolic dysfunction; LVH (left ventricular hypertrophy); Accelerated hypertension; Acute on chronic diastolic congestive heart failure (HCC); Class 2 obesity; Uncontrolled diabetes mellitus with hyperglycemia, with long-term current use of insulin  (HCC); Acute on chronic heart failure with preserved ejection fraction (HFpEF) (HCC); Hypertensive emergency; Acute respiratory failure with hypoxia (HCC); Elevated troponin; Pleuritic chest pain; Type 2 diabetes mellitus with peripheral neuropathy (HCC); Gastroesophageal reflux disease without esophagitis; COPD with asthma (HCC); Dyslipidemia; Unstable angina (HCC); Acute on chronic diastolic heart failure (HCC); Myocardial injury; Type II diabetes mellitus with renal manifestations (HCC); Obesity (BMI 30-39.9); HLD (hyperlipidemia); Chronic diastolic CHF (congestive heart failure) (HCC); Diarrhea; CAD (coronary artery disease); CKD (chronic kidney disease) stage 2, GFR 60-89 ml/min; Degenerative disc disease, cervical; Osteoarthritis of spine with  radiculopathy, cervical region; History of colon polyps; COPD exacerbation (HCC); CHF (congestive heart failure) (HCC); Acute pulmonary edema (HCC); Nicotine  dependence; Class 3 obesity; Acute on chronic diastolic (congestive) heart failure (HCC); Chronic kidney disease, stage 3a (HCC); and Stable angina (HCC) on their problem list. he  has a past medical history of Bulging of cervical intervertebral disc, CAD (coronary artery disease), Cervical myelopathy (HCC), Chronic heart failure with preserved ejection fraction (HFpEF) (HCC), CKD (chronic kidney disease), stage III (HCC), COPD (chronic obstructive pulmonary disease) (HCC), Gout, Hyperlipidemia LDL goal <70, Hypertension, Morbid obesity (HCC), OSA (obstructive sleep apnea), Stress due to family tension (04/15/2019), and Tracheobronchomalacia. he  has a past surgical history that includes Cholecystectomy; Colonoscopy with propofol  (N/A, 03/10/2020); RIGHT/LEFT HEART CATH AND CORONARY ANGIOGRAPHY (N/A, 05/29/2023); CORONARY PRESSURE/FFR STUDY (N/A, 05/29/2023); Cardiac catheterization; Back surgery; Colonoscopy with propofol  (N/A, 09/25/2023); polypectomy (09/25/2023); and RIGHT/LEFT HEART CATH AND CORONARY ANGIOGRAPHY (Bilateral, 10/30/2023) are also affecting patient's functional outcome.   REHAB POTENTIAL: Fair due to severity and complexity of condition and past experiences. Good rehab potential to improve to where he was at last PT discharge.   CLINICAL DECISION MAKING: Evolving/moderate complexity  EVALUATION COMPLEXITY: Moderate   GOALS: Goals reviewed with patient? No  SHORT TERM GOALS: Target date: 06/02/2024  Patient will be independent with initial home exercise program for self-management of symptoms. Baseline: Initial HEP provided at IE (05/19/24); Goal status: MET   LONG TERM GOALS: Target date: 08/11/2024. Target date updated to 10/06/2024 for all unmet goal son 07/14/2024.   Patient will be independent with a long-term home  exercise program for self-management of symptoms.  Baseline: Initial HEP provided at IE (05/19/24); has not been performing since his heart attack (07/14/2024);  Goal status: Ongoing  2.  Patient will demonstrate improved Neck Disability Index (NDI) to equal or less than 10% to demonstrate improvement in overall condition and self-reported functional ability.  Baseline: 58% (05/19/24); 52% (07/14/2024); 48% (07/28/2024) Goal status: PROGRESSING  3.  Patient will demonstrate increased UE myotomes by equal or greater than 1/2 MMT or up to 4+/5 to improve his ability to use his B UE for riding the tractor  and completing household activities.  Baseline: as low as 3/5  (05/19/24); similar as low as 3/5 (07/14/24); about 4/5 average (07/28/2024) Goal status: Ongoing  4.  Patient will demonstrate improved cervical spine rotation to 40 degrees bilaterally to maximize his use of the AA ROM and view his surroundings more easily.  Baseline: R/L 15/34 (05/19/24); R/L 30/35 (07/14/24); 30/35 (07/28/2024) Goal status: In progress  5.  Patient will demonstrate improvement in Patient Specific Functional Scale (PSFS) of equal or greater than 8/10 points to reflect clinically significant improvement in patient's most valued functional activities. Baseline: to be measured at visit 2 as appropriate (05/19/24); 3.3/10 (05/21/2024); 2.3/10 (07/14/2024) Goal status: Ongoing    PLAN:  PT FREQUENCY: 2x/week  PT DURATION: 8-12 weeks  PLANNED INTERVENTIONS: 97164- PT Re-evaluation, 97750- Physical Performance Testing, 97110-Therapeutic exercises, 97530- Therapeutic activity, 97112- Neuromuscular re-education, 97535- Self Care, [id number]- Manual therapy, [id number]- Aquatic Therapy, [id number]- Electrical stimulation (unattended), 20560 (1-2 muscles), 20561 (3+ muscles)- Dry Needling, Patient/Family education, Joint mobilization, Spinal mobilization, Cryotherapy, and Moist heat  PLAN FOR NEXT SESSION: Focus on thoracic mobility  exercises and functional tasks. Stretches for neck flexors and accessory breathing muscles. update HEP as appropriate, educate on mechanical stressors and go through day to help with education on alleviation of these for the short term, exercise to improve intrascapular activation and strength for more balanced force couple and decrease over-reliance on UT, interventions to improve thoracic and AA/OA mobility. education  Emil Glassman PT, DPT  Physical Therapist - [place]  [facility]

## 2024-07-30 ENCOUNTER — Ambulatory Visit: Admitting: Physical Therapy

## 2024-08-05 ENCOUNTER — Other Ambulatory Visit: Payer: Self-pay

## 2024-08-05 MED FILL — Continuous Glucose System Sensor: 28 days supply | Qty: 2 | Fill #1 | Status: AC

## 2024-08-12 ENCOUNTER — Ambulatory Visit: Admitting: Physician Assistant

## 2024-08-14 ENCOUNTER — Other Ambulatory Visit: Payer: Self-pay

## 2024-08-14 ENCOUNTER — Other Ambulatory Visit: Payer: Self-pay | Admitting: Family

## 2024-08-14 ENCOUNTER — Encounter: Payer: Self-pay | Admitting: Physician Assistant

## 2024-08-14 ENCOUNTER — Other Ambulatory Visit: Payer: Self-pay | Admitting: Family Medicine

## 2024-08-14 ENCOUNTER — Ambulatory Visit: Attending: Physician Assistant | Admitting: Physician Assistant

## 2024-08-14 VITALS — BP 160/80 | HR 61 | Ht 66.0 in | Wt 223.4 lb

## 2024-08-14 DIAGNOSIS — Z79899 Other long term (current) drug therapy: Secondary | ICD-10-CM | POA: Diagnosis not present

## 2024-08-14 DIAGNOSIS — E669 Obesity, unspecified: Secondary | ICD-10-CM | POA: Insufficient documentation

## 2024-08-14 DIAGNOSIS — I10 Essential (primary) hypertension: Secondary | ICD-10-CM | POA: Diagnosis not present

## 2024-08-14 DIAGNOSIS — I428 Other cardiomyopathies: Secondary | ICD-10-CM | POA: Diagnosis not present

## 2024-08-14 DIAGNOSIS — G4733 Obstructive sleep apnea (adult) (pediatric): Secondary | ICD-10-CM | POA: Insufficient documentation

## 2024-08-14 DIAGNOSIS — N1831 Chronic kidney disease, stage 3a: Secondary | ICD-10-CM | POA: Diagnosis not present

## 2024-08-14 DIAGNOSIS — I5032 Chronic diastolic (congestive) heart failure: Secondary | ICD-10-CM | POA: Insufficient documentation

## 2024-08-14 DIAGNOSIS — E785 Hyperlipidemia, unspecified: Secondary | ICD-10-CM | POA: Diagnosis not present

## 2024-08-14 DIAGNOSIS — I2510 Atherosclerotic heart disease of native coronary artery without angina pectoris: Secondary | ICD-10-CM | POA: Insufficient documentation

## 2024-08-14 DIAGNOSIS — I272 Pulmonary hypertension, unspecified: Secondary | ICD-10-CM | POA: Diagnosis not present

## 2024-08-14 DIAGNOSIS — Z720 Tobacco use: Secondary | ICD-10-CM | POA: Diagnosis not present

## 2024-08-14 MED ORDER — VALSARTAN 160 MG PO TABS
160.0000 mg | ORAL_TABLET | Freq: Every day | ORAL | 3 refills | Status: DC
  Filled 2024-08-14: qty 90, 90d supply, fill #0

## 2024-08-14 NOTE — Patient Instructions (Signed)
 Medication Instructions:   Your physician recommends the following medication changes.  INCREASE: Valsartan  160 mg once daily   *If you need a refill on your cardiac medications before your next appointment, please call your pharmacy*  Lab Work:  Your provider would like for you to have following labs drawn today BMet, Lipid Panel.    If you have labs (blood work) drawn today and your tests are completely normal, you will receive your results only by:  MyChart Message (if you have MyChart) OR  A paper copy in the mail If you have any lab test that is abnormal or we need to change your treatment, we will call you to review the results.  Testing/Procedures:  None ordered at this time   Referrals:  Your cardiologist has referred you to Cardiac Rehab  We have attached their office location and phone number below.  Please allow them 3-5 business days to reach out to you to make an appointment.  If you have not heard from their office within that time, please call them to schedule your appointment.    Follow-Up:  At [facility], you and your health needs are our priority.  As part of our continuing mission to provide you with exceptional heart care, our providers are all part of one team.  This team includes your primary Cardiologist (physician) and Advanced Practice Providers or APPs (Physician Assistants and Nurse Practitioners) who all work together to provide you with the care you need, when you need it.  Your next appointment:   2 month(s)  Provider:    You may see Redell Cave, MD or one of the following Advanced Practice Providers on your designated Care Team:   Lonni Meager, NP Lesley Maffucci, PA-C Bernardino Bring, PA-C Cadence Westover, PA-C Tylene Lunch, NP Barnie Hila, NP    We recommend signing up for the patient portal called MyChart.  Sign up information is provided on this After Visit Summary.  MyChart is used to connect with patients for Virtual  Visits (Telemedicine).  Patients are able to view lab/test results, encounter notes, upcoming appointments, etc.  Non-urgent messages can be sent to your provider as well.   To learn more about what you can do with MyChart, go to [email or web address].   Other Instructions  Please send us  your blood pressure log readings in about 2 weeks.  You can send them using MyChart, or drop off a copy to our office.    Avoid alcohol, caffeine, and exercise within 30 minutes before checking blood pressure. Sit still, with feet flat on the floor, in a straight backed, supportive chair.  Rest for 5 minutes.  Support arm on flat surface at heart level. Bottom of cuff should be placed directly above the bend of the elbow. Take multiple readings.  Write down and bring to all follow up appointments.  Bring cuff to clinic periodically for accuracy comparison.

## 2024-08-14 NOTE — Progress Notes (Signed)
 Cardiology Office Note    Date:  08/14/2024   ID:  Cory Rivas, DOB 1966/07/27, MRN [id number]  PCP:  Leavy Mole, PA-C  Cardiologist:  Redell Cave, MD  Electrophysiologist:  None   Chief Complaint: Follow up  History of Present Illness:   Cory Rivas is a 58 y.o. male with history of moderate nonobstructive CAD, chronic HFpEF, stage IIIa chronic kidney disease, insulin -dependent diabetes, hypertension, hyperlipidemia, morbid obesity, tobacco abuse, COPD, gout, sleep apnea, tracheobronchomalacia, and cervical myelopathy who presents for follow up on CAD and chronic HFpEF.     Cory Rivas cardiac history dates back to 2021 when he underwent stress testing 09/2020 as a part of preoperative evaluation which was low risk but did show coronary calcification on CT imaging.  Echo 10/2020 showed EF 60 to 65% with G1 DD.   He was hospitalized at [facility] 05/2023 in the setting of chest pain, dyspnea, and hypertensive urgency.  Troponin was mildly elevated and peaked at 26.  Echo showed EF 50 to 55% without RWMA, moderate LVH, G3 DD.  Diagnostic catheterization revealed moderate nonobstructive CAD including sequential 50 to 60% ostial proximal LAD stenosis that were not hemodynamically significant by RFR (0.93).  Medical therapy and ongoing diuresis were recommended.  He was readmitted in 08/2023 for hypertensive urgency and HFpEF requiring diuresis.  He was subsequently treated for pneumonia in 08/2023.   In 10/2023 he was admitted to [facility] with respiratory failure, COPD exacerbation, HFpEF, and acute on chronic stage III kidney disease.  He required diuresis, steroids, nebulizers, and antibiotics.  Following discharge, he reported ongoing chest discomfort office follow-up and underwent diagnostic catheterization 10/2023 showing overall stable anatomy with recommendation for escalation of medical therapy (amlodipine  increased to 10 mg daily).  He was again seen in the ED 12/2023 and admitted in  01/2024 with atypical chest pain, mild troponin elevation, COPD, and HFpEF.  He was admitted again in 03/2024 in the setting of acute on chronic HFpEF and chest pressure.  Troponins were again mildly elevated with flat trend.  He was diuresed and subsequently discharged.  He was again hospitalized 05/17/2024 in the setting of chest discomfort.  At that time, he reported almost daily retrosternal chest discomfort occurring at rest and with activity.  Troponins were mildly elevated and flat trending.  Echo was ordered but not completed as patient requested to be discharged home.  He was started on Ranexa  for further management of angina.  He was discharged home in stable condition on 8/3.  Patient was [facility] to [facility]/08/2024 with chest pain.  Troponin 19 > 47 > 1600.  Chest x-ray negative.  CTA of the chest without aortic abnormality and no PE.  EKG showed sinus rhythm with inferolateral ST/T wave abnormalities.  Left heart catheterization revealed severe one-vessel CAD with evidence of in the proximal LAD at the bifurcation of a large diagonal branch likely due to plaque rupture with successful PCI/DES to the LAD and PCI to the ostial diagonal.  He was started on DAPT with aspirin  and prasugrel  for at least 12 months.  Echo showed EF 55 to 60% with moderate LVH, G2 DD, and mild MR.   Patient was most recently seen in our office 06/23/2024 and overall doing well from a cardiac perspective.  He had remained fairly active around the house.  Ranexa  was discontinued.  BP was 148/101.  Bisoprolol  was uptitrated for better blood pressure control.  He was seen at the [facility] advanced heart failure clinic 07/21/2024  and reported fatigue.  BP was noted to be elevated at 160/80 and he was recommended to start hydralazine  25 mg twice daily. Meds are packages and it was not clear if he would be able to do three times daily dosing.   Patient presents today overall doing well from a cardiac perspective without symptoms of  angina or cardiac decompensation. He stays active by working on his tractor and with odd jobs like leveling his driveway. He is able to do this largely without restrictions. He tells me there have been two times recently that he has overexerted himself trying to lift heavy objects or do too much physical labor during which he got short of breath and needed to stop. He has been without dyspnea on exertion aside from these occasions. He denies exertional chest pain. He rolled his tractor several months ago and has been struggling with neck pain since for which he is followed at [facility]. He just finished physical therapy and would like to start cardiac rehab now. His blood pressure has been running high at home around 150s-160s systolic. He recently started hydralazine  with some improvement in readings. He reports compliance with his medications which he gets in packs from the pharmacy. He sleeps in a recliner and reports some difficultly recently because his CPAP was taken away due to noncompliance. He has an upcoming appointment to have this replaced. He denies orthopnea, PND, abdominal fullness and lower extremity swelling.   Labs independently reviewed: 07/21/2024-BUN 15, creatinine 1.58, sodium 138, potassium 4.4 07/07/2024-normal LFTs, Hgb 16.1, HCT 46.6, platelets 226 06/08/2024-TC 181, TG 208, HDL 38, LDL 101  Objective   Past Medical History:  Diagnosis Date   Bulging of cervical intervertebral disc    CAD (coronary artery disease)    a. 09/2020 MV: low risk; b. 05/2023 Cath: Moderate LAD dzs w/ seq 50-60% stenoses and nl RFR 0.93. Mod OM dzs->Med rx; b. 10/2023 Cath: LM min irregs, LAD 55ost, 55p/m, LCX large nl, OM1 50, RCA mod, nl.   Cervical myelopathy (HCC)    Chronic heart failure with preserved ejection fraction (HFpEF) (HCC)    a. 05/2023 Echo: EF 50-55%, no rwma, GrIII DD, nl RV fxn, mild MR, Ao sclerosis; b. 10/2023 Echo: EF 55-60%, no rwma, nl RV fxn, mild MR, Ao sclerosis.   CKD (chronic  kidney disease), stage III (HCC)    COPD (chronic obstructive pulmonary disease) (HCC)    Diabetes mellitus without complication (HCC)    Gout    Hyperlipidemia LDL goal <70    Hypertension    Morbid obesity (HCC)    OSA (obstructive sleep apnea)    Stress due to family tension 04/15/2019   Tracheobronchomalacia     Current Medications: Current Meds  Medication Sig   allopurinol  (ZYLOPRIM ) 100 MG tablet Take 1 tablet (100 mg total) by mouth daily with the 300mg  tablet for a total daily dose of 400mg .   allopurinol  (ZYLOPRIM ) 300 MG tablet Take 1 tablet (300 mg total) by mouth daily as needed (Gout flare). Home med.   amLODipine  (NORVASC ) 10 MG tablet Take 1 tablet (10 mg total) by mouth daily.   aspirin  EC 81 MG tablet Take 1 tablet (81 mg total) by mouth daily. Swallow whole.   baclofen  (LIORESAL ) 10 MG tablet Take 1 tablet (10 mg total) by mouth 3 (three) times daily as needed for muscle spasms.   bisoprolol  (ZEBETA ) 5 MG tablet Take 2 tablets (10 mg total) by mouth every morning AND 1 tablet (5 mg  total) at bedtime.   budesonide -formoterol  (SYMBICORT ) 160-4.5 MCG/ACT inhaler Inhale 2 puffs into the lungs 2 (two) times daily. (Patient taking differently: Inhale 2 puffs into the lungs 2 (two) times daily. As needed)   cetirizine  (ZYRTEC ) 10 MG tablet Take 1 tablet (10 mg total) by mouth daily.   cromolyn  (OPTICROM ) 4 % ophthalmic solution Place 1 drop into both eyes 4 (four) times daily as needed.   Dulaglutide  (TRULICITY ) 3 MG/0.5ML SOAJ Inject 3 mg into the skin once a week.   DULoxetine  (CYMBALTA ) 30 MG capsule Take 2 capsules (60 mg total) by mouth every morning AND 1 capsule (30 mg total) every evening.   empagliflozin  (JARDIANCE ) 10 MG TABS tablet Take 1 tablet (10 mg total) by mouth daily.   Evolocumab  (REPATHA  SURECLICK) 140 MG/ML SOAJ Inject 140 mg into the skin every 14 (fourteen) days.   ezetimibe  (ZETIA ) 10 MG tablet Take 1 tablet (10 mg total) by mouth daily.   fluticasone   (FLONASE ) 50 MCG/ACT nasal spray Place 1 spray into both nostrils daily.   Glucagon  (BAQSIMI ONE PACK) 3 MG/DOSE POWD Place 1 Device into the nose as needed (Low blood sugar with impaired consciousness).   hydrALAZINE  (APRESOLINE ) 25 MG tablet Take 1 tablet (25 mg total) by mouth in the morning and at bedtime.   insulin  glargine (LANTUS  SOLOSTAR) 100 UNIT/ML Solostar Pen Inject 20-25 Units into the skin at bedtime.   insulin  lispro (HUMALOG ) 100 UNIT/ML KwikPen Inject 10 Units into the skin 3 (three) times daily. Plus sliding scale up per instructions   ipratropium-albuterol  (DUONEB) 0.5-2.5 (3) MG/3ML SOLN Take 3 mLs by nebulization every 6 (six) hours as needed.   levocetirizine (XYZAL ) 5 MG tablet Take 1 tablet (5 mg total) by mouth every evening.   nicotine  polacrilex (NICOTINE  MINI) 2 MG lozenge Take 1 lozenge (2 mg total) by mouth every 2 (two) hours as needed for smoking cessation.   pantoprazole  (PROTONIX ) 40 MG tablet Take 1 tablet (40 mg total) by mouth daily.   prasugrel  (EFFIENT ) 10 MG TABS tablet Take 1 tablet (10 mg total) by mouth daily.   pregabalin  (LYRICA ) 50 MG capsule Take 1 capsule (50 mg total) by mouth 2 (two) times daily as needed. Home med.   ranolazine  (RANEXA ) 500 MG 12 hr tablet Take 1 tablet (500 mg total) by mouth 2 (two) times daily.   rosuvastatin  (CRESTOR ) 40 MG tablet Take 1 tablet (40 mg total) by mouth daily.   spironolactone  (ALDACTONE ) 25 MG tablet Take 1 tablet (25 mg total) by mouth daily.   sucralfate  (CARAFATE ) 1 g tablet Take 1 tablet (1 g total) by mouth 4 (four) times daily -  with meals and at bedtime.   Tiotropium Bromide  Monohydrate (SPIRIVA  RESPIMAT) 2.5 MCG/ACT AERS Inhale 2 puffs into the lungs daily.   torsemide  (DEMADEX ) 20 MG tablet Take 2 tablets (40 mg total) by mouth daily.   VENTOLIN  HFA 108 (90 Base) MCG/ACT inhaler Inhale 2 puffs into the lungs every 6 (six) hours as needed for wheezing or shortness of breath.   [DISCONTINUED] valsartan   (DIOVAN ) 80 MG tablet Take 1 tablet (80 mg total) by mouth daily.    Allergies:   Oxycodone , Entresto  [sacubitril -valsartan ], Onion, and Nitroglycerin    Social History   Socioeconomic History   Marital status: Divorced    Spouse name: Not on file   Number of children: 0   Years of education: 9   Highest education level: 9th grade  Occupational History   Occupation: disability  Tobacco Use   Smoking status: Every Day    Current packs/day: 0.50    Average packs/day: 1 pack/day for 38.4 years (37.7 ttl pk-yrs)    Types: Cigarettes    Start date: 10/19/1989    Last attempt to quit: 08/20/2022   Smokeless tobacco: Never   Tobacco comments:    30+ years hx smoking 2 ppd, stopped smoking briefly in April 2022 for a surgery and then restarted smoking.  As of August 2025, he smoking about 2 to 4 cigarettes a day.  Vaping Use   Vaping status: Never Used  Substance and Sexual Activity   Alcohol use: No   Drug use: No   Sexual activity: Not Currently    Partners: Female  Other Topics Concern   Not on file  Social History Narrative   Lives with sister [name], brother in law [name]), and his mother - Cory Rivas   Does not routinely exercise.   Social Drivers of Health   Financial Resource Strain: High Risk (01/21/2024)   Overall Financial Resource Strain (CARDIA)    Difficulty of Paying Living Expenses: Hard  Food Insecurity: No Food Insecurity (04/04/2024)   Hunger Vital Sign    Worried About Running Out of Food in the Last Year: Never true    Ran Out of Food in the Last Year: Never true  Transportation Needs: No Transportation Needs (04/04/2024)   PRAPARE - Administrator, Civil Service (Medical): No    Lack of Transportation (Non-Medical): No  Physical Activity: Insufficiently Active (12/06/2022)   Exercise Vital Sign    Days of Exercise per Week: 3 days    Minutes of Exercise per Session: 10 min  Stress: No Stress Concern Present (12/06/2022)   [employer] of  Occupational Health - Occupational Stress Questionnaire    Feeling of Stress : Only a little  Social Connections: Socially Isolated (02/17/2024)   Social Connection and Isolation Panel    Frequency of Communication with Friends and Family: More than three times a week    Frequency of Social Gatherings with Friends and Family: Twice a week    Attends Religious Services: Never    Database Administrator or Organizations: No    Attends Engineer, Structural: Never    Marital Status: Divorced     Family History:  The patient's family history includes AAA (abdominal aortic aneurysm) in his maternal grandmother; Diabetes in his maternal grandfather and mother; Heart attack in his maternal grandmother and mother; Heart disease in his mother; Hyperlipidemia in his mother; Hypertension in his father and mother; Lung cancer in his father; Stroke in his father.  ROS:   12-point review of systems is negative unless otherwise noted in the HPI.  EKGs/Other Studies Reviewed:    Studies reviewed were summarized above. The additional studies were reviewed today:  06/08/2024 LHC   Ost LAD lesion is 55% stenosed.   1st Mrg lesion is 60% stenosed.   Prox LAD to Mid LAD lesion is 95% stenosed.   1st Diag lesion is 60% stenosed.   A drug-eluting stent was successfully placed using a STENT ONYX FRONTIER 3.0X15.   Balloon angioplasty was performed using a BALLOON TREK RX 2.5X12.   Post intervention, there is a 0% residual stenosis.   Post intervention, there is a 10% residual stenosis.   In the absence of any other complications or medical issues, we expect the patient to be ready for discharge from an interventional cardiology perspective on 06/09/2024.  Recommend uninterrupted dual antiplatelet therapy with Aspirin  81mg  daily and Prasugrel  10mg  daily for a minimum of 12 months (ACS-Class I recommendation).   1.  Severe one-vessel coronary artery disease with evidence of hazy stenosis in the proximal  LAD at the bifurcation of a large diagonal branch likely due to plaque rupture.  Stable moderate ostial LAD and OM1 stenosis. 2.  Left ventricular angiography was not performed.  EF was normal by echo. 3.  Moderately severely elevated left ventricular end-diastolic pressure at 28 to 30 mmHg. 4.  Difficult but successful OCT guided angioplasty and drug-eluting stent placement to the LAD with balloon angioplasty to the ostial diagonal .  Difficult procedure overall due to bifurcation involvement and significant difficulty in wiring the LAD due to steep angulation after the diagonal origin.   06/08/2024 Echo complete 1. Left ventricular ejection fraction, by estimation, is 55 to 60%. The  left ventricle has normal function. The left ventricle has no regional  wall motion abnormalities. There is moderate left ventricular hypertrophy.  Left ventricular diastolic  parameters are consistent with Grade II diastolic dysfunction  (pseudonormalization).   2. Right ventricular systolic function is normal. The right ventricular  size is normal. Tricuspid regurgitation signal is inadequate for assessing  PA pressure.   3. Left atrial size was mildly dilated.   4. The mitral valve is normal in structure. Mild mitral valve  regurgitation. No evidence of mitral stenosis.   5. The aortic valve is normal in structure. Aortic valve regurgitation is  not visualized. Aortic valve sclerosis/calcification is present, without  any evidence of aortic stenosis.   EKG:  EKG personally reviewed by me today EKG Interpretation Date/Time:  Friday August 14 2024 10:34:48 EDT Ventricular Rate:  61 PR Interval:  166 QRS Duration:  76 QT Interval:  422 QTC Calculation: 424 R Axis:   15  Text Interpretation: Normal sinus rhythm T wave abnormality When compared with ECG of 21-Jul-2024 13:52, No significant change was found Confirmed by Lorene Sinclair ([id number]) on 08/14/2024 10:36:12 AM  PHYSICAL EXAM:    VS:  BP (!)  160/80 (BP Location: Left Arm, Patient Position: Sitting, Cuff Size: Normal)   Pulse 61   Ht 5' 6 (1.676 m)   Wt 223 lb 6.4 oz (101.3 kg)   SpO2 96%   BMI 36.06 kg/m   BMI: Body mass index is 36.06 kg/m.  GEN: Well nourished, well developed in no acute distress NECK: No JVD; No carotid bruits CARDIAC: RRR, no murmurs, rubs, gallops RESPIRATORY:  Clear to auscultation without rales, wheezing or rhonchi  ABDOMEN: Soft, non-tender, non-distended EXTREMITIES: No edema; No deformity  Wt Readings from Last 3 Encounters:  08/14/24 223 lb 6.4 oz (101.3 kg)  07/21/24 225 lb (102.1 kg)  07/17/24 219 lb 12.8 oz (99.7 kg)                 Cardiac Rehabilitation Eligibility Assessment  The patient is ready to start cardiac rehabilitation from a cardiac standpoint.    ASSESSMENT & PLAN:   Coronary artery disease - S/p PCI/DES to the proximal LAD 06/08/2024. No symptoms concerning for angina. DAPT with ASA 81 mg daily and Effient  10 mg daily for at least 12 months. Continued on Ranexa  500 mg twice daily, Repatha , ezetimibe  10 mg daily, and rosuvastatin  40 mg daily. He is interested in starting cardiac rehab now, referral sent.   HFpEF Pulmonary hypertension - RHC 10/2023 with normal filling pressures and mild pulmonary hypertension. Most recent echo 05/2024 with  EF 55-60%, G2DD. Appears euvolemic on exam. Overall doing well with NYHA class II symptoms. He is continued on bisoprolol  10 mg in the AM and 5 mg in the PM, Jardiance  10 mg daily, spironolactone  25 mg daily, and torsemide  40 mg daily. I will increase valsartan  to 160 mg daily for better BP control. BMP today. He has upcoming appointnment with [place] heart failure clinic at the beginning of December.   Hypertension - BP is 205/100 and 160/80 on recheck. Patient takes BP at home but did not bring log today and cannot recall values. I will increase valsartan  to 160 mg daily. He is continued on amlodipine  10 g daily, bisoprolol  as  above, and hydralazine  25 mg twice daily.   Hyperlipidemia - Most recent lipid panel 05/2024 with LDL 101. Update lipid panel today. He is continued on Repatha , ezetimibe , and rosuvastatin  as above. If LDL remains elevated, would recommend referral to lipid clinic.   CKD IIIa - Cr 1.58 on 10/7, slightly up from baseline. Update BMP today.  Obesity - Recommend healthy lifestyle modifications to achieve weight loss.   Tobacco use - Continues to smoke up to 1 ppd, although he is interested in quitting.   OSA - Nonadherent to CPAP leading to machine being returned. He has upcoming appointment to discuss.      Disposition: Increase valsartan  to 160 mg daily. Update lipid panel and BMP. F/u with Dr. Darliss or an APP in 2 months.   Medication Adjustments/Labs and Tests Ordered: Current medicines are reviewed at length with the patient today.  Concerns regarding medicines are outlined above. Medication changes, Labs and Tests ordered today are summarized above and listed in the Patient Instructions accessible in Encounters.   Signed, Lesley Maffucci, PA-C 08/14/2024 12:16 PM     [facility] [place] [address] [place], [place] [address] [phone]

## 2024-08-15 LAB — BASIC METABOLIC PANEL WITH GFR
BUN/Creatinine Ratio: 11 (ref 9–20)
BUN: 13 mg/dL (ref 6–24)
CO2: 21 mmol/L (ref 20–29)
Calcium: 8.8 mg/dL (ref 8.7–10.2)
Chloride: 98 mmol/L (ref 96–106)
Creatinine, Ser: 1.19 mg/dL (ref 0.76–1.27)
Glucose: 267 mg/dL — ABNORMAL HIGH (ref 70–99)
Potassium: 4.2 mmol/L (ref 3.5–5.2)
Sodium: 136 mmol/L (ref 134–144)
eGFR: 71 mL/min/1.73 (ref >59)

## 2024-08-15 LAB — LIPID PANEL
Chol/HDL Ratio: 5.8 ratio — ABNORMAL HIGH (ref 0.0–5.0)
Cholesterol, Total: 226 mg/dL — ABNORMAL HIGH (ref 100–199)
HDL: 39 mg/dL — ABNORMAL LOW (ref >39)
LDL Chol Calc (NIH): 129 mg/dL — ABNORMAL HIGH (ref 0–99)
Triglycerides: 325 mg/dL — ABNORMAL HIGH (ref 0–149)
VLDL Cholesterol Cal: 58 mg/dL — ABNORMAL HIGH (ref 5–40)

## 2024-08-17 ENCOUNTER — Ambulatory Visit: Admitting: Family Medicine

## 2024-08-17 ENCOUNTER — Ambulatory Visit: Payer: Self-pay | Admitting: Physician Assistant

## 2024-08-17 ENCOUNTER — Other Ambulatory Visit: Payer: Self-pay

## 2024-08-17 DIAGNOSIS — E785 Hyperlipidemia, unspecified: Secondary | ICD-10-CM

## 2024-08-17 NOTE — Telephone Encounter (Signed)
 Requested medication (s) are due for refill today: yes  Requested medication (s) are on the active medication list: yes  Last refill:  06/04/24  Future visit scheduled: yes  Notes to clinic:  Unable to refill per protocol, last refill by another provider.      Requested Prescriptions  Pending Prescriptions Disp Refills   pantoprazole  (PROTONIX ) 40 MG tablet 30 tablet 1    Sig: Take 1 tablet (40 mg total) by mouth daily.     Gastroenterology: Proton Pump Inhibitors Passed - 08/17/2024 10:48 AM      Passed - Valid encounter within last 12 months    Recent Outpatient Visits           2 months ago Uncontrolled diabetes mellitus with hyperglycemia, with long-term current use of insulin  [facility])   [facility] Leavy Mole, PA-C   3 months ago Allergic rash present on examination   [facility] [name], DO   5 months ago Hospital discharge follow-up   [facility] Leavy Mole, PA-C   6 months ago Exacerbation of persistent asthma, unspecified asthma severity   [facility] Leavy Mole, PA-C   6 months ago Encounter for examination following treatment at hospital   [facility] Leavy Mole, [place]       Future Appointments             In 2 months Lorene Lesley CROME, PA-C [facility]

## 2024-08-25 ENCOUNTER — Ambulatory Visit: Admitting: "Endocrinology

## 2024-08-25 ENCOUNTER — Other Ambulatory Visit: Payer: Self-pay

## 2024-08-25 MED FILL — Rosuvastatin Calcium Tab 40 MG: ORAL | 30 days supply | Qty: 30 | Fill #2 | Status: AC

## 2024-08-25 MED FILL — Ezetimibe Tab 10 MG: ORAL | 30 days supply | Qty: 30 | Fill #2 | Status: AC

## 2024-08-25 MED FILL — Allopurinol Tab 100 MG: ORAL | 30 days supply | Qty: 30 | Fill #2 | Status: AC

## 2024-08-25 MED FILL — Duloxetine HCl Enteric Coated Pellets Cap 30 MG (Base Eq): ORAL | 30 days supply | Qty: 90 | Fill #2 | Status: AC

## 2024-08-26 ENCOUNTER — Other Ambulatory Visit: Payer: Self-pay | Admitting: Family Medicine

## 2024-08-26 ENCOUNTER — Other Ambulatory Visit: Payer: Self-pay

## 2024-08-27 ENCOUNTER — Other Ambulatory Visit: Payer: Self-pay

## 2024-08-27 MED ORDER — PANTOPRAZOLE SODIUM 40 MG PO TBEC
40.0000 mg | DELAYED_RELEASE_TABLET | Freq: Every day | ORAL | 1 refills | Status: AC
  Filled 2024-08-27: qty 90, 90d supply, fill #0
  Filled 2024-08-28: qty 30, 30d supply, fill #0
  Filled 2024-09-25: qty 30, 30d supply, fill #1
  Filled 2024-11-09 – 2024-11-13 (×2): qty 30, 30d supply, fill #2

## 2024-08-27 NOTE — Telephone Encounter (Signed)
 Requested Prescriptions  Pending Prescriptions Disp Refills   pantoprazole  (PROTONIX ) 40 MG tablet 90 tablet 1    Sig: Take 1 tablet (40 mg total) by mouth daily.     Gastroenterology: Proton Pump Inhibitors Passed - 08/27/2024  5:05 PM      Passed - Valid encounter within last 12 months    Recent Outpatient Visits           3 months ago Uncontrolled diabetes mellitus with hyperglycemia, with long-term current use of insulin  [facility])   [facility] Leavy Mole, PA-C   3 months ago Allergic rash present on examination   [facility] [name], DO   6 months ago Hospital discharge follow-up   [facility] Leavy Mole, PA-C   6 months ago Exacerbation of persistent asthma, unspecified asthma severity   [facility] Leavy Mole, PA-C   7 months ago Encounter for examination following treatment at hospital   [facility] Leavy Mole, PA-C       Future Appointments             In 1 month Lorene Lesley CROME, PA-C [facility] at [facility]

## 2024-08-28 ENCOUNTER — Other Ambulatory Visit: Payer: Self-pay

## 2024-08-28 ENCOUNTER — Other Ambulatory Visit (HOSPITAL_COMMUNITY): Payer: Self-pay

## 2024-09-03 ENCOUNTER — Other Ambulatory Visit: Payer: Self-pay

## 2024-09-14 ENCOUNTER — Other Ambulatory Visit: Payer: Self-pay

## 2024-09-15 ENCOUNTER — Other Ambulatory Visit: Payer: Self-pay | Admitting: Family

## 2024-09-15 ENCOUNTER — Other Ambulatory Visit: Payer: Self-pay

## 2024-09-15 DIAGNOSIS — E1165 Type 2 diabetes mellitus with hyperglycemia: Secondary | ICD-10-CM

## 2024-09-18 ENCOUNTER — Other Ambulatory Visit: Payer: Self-pay

## 2024-09-21 ENCOUNTER — Other Ambulatory Visit: Payer: Self-pay

## 2024-09-21 ENCOUNTER — Other Ambulatory Visit: Payer: Self-pay | Admitting: Family Medicine

## 2024-09-21 DIAGNOSIS — E1165 Type 2 diabetes mellitus with hyperglycemia: Secondary | ICD-10-CM

## 2024-09-21 MED FILL — Continuous Glucose System Sensor: 28 days supply | Qty: 2 | Fill #0 | Status: AC

## 2024-09-22 ENCOUNTER — Encounter: Admitting: Family

## 2024-09-23 NOTE — Telephone Encounter (Signed)
 Duplicate request.  Requested Prescriptions  Pending Prescriptions Disp Refills   Continuous Glucose Sensor (FREESTYLE LIBRE 3 SENSOR) MISC [Pharmacy Med Name: Continuous Glucose Sensor (FREESTYLE LIBRE 3 SENSOR) Misc] 6 each 2    Sig: Place 1 sensor onto skin every 14 (fourteen) days to monitor blood sugar continuously     Endocrinology: Diabetes - Testing Supplies Passed - 09/23/2024 12:25 PM      Passed - Valid encounter within last 12 months    Recent Outpatient Visits           3 months ago Uncontrolled diabetes mellitus with hyperglycemia, with long-term current use of insulin  [facility])   [facility] Leavy Mole, PA-C   4 months ago Allergic rash present on examination   [facility] [name], DO   7 months ago Hospital discharge follow-up   [facility] Leavy Mole, PA-C   7 months ago Exacerbation of persistent asthma, unspecified asthma severity   [facility] Leavy Mole, PA-C   8 months ago Encounter for examination following treatment at hospital   [facility] Leavy Mole, [place]       Future Appointments             In 3 weeks Lorene Lesley CROME, PA-C [facility] at [facility]

## 2024-09-24 ENCOUNTER — Other Ambulatory Visit: Payer: Self-pay

## 2024-09-24 ENCOUNTER — Encounter: Payer: Self-pay | Admitting: Nurse Practitioner

## 2024-09-24 ENCOUNTER — Ambulatory Visit (INDEPENDENT_AMBULATORY_CARE_PROVIDER_SITE_OTHER): Admitting: Nurse Practitioner

## 2024-09-24 VITALS — BP 189/71 | HR 73 | Temp 98.6°F | Ht 66.0 in | Wt 227.0 lb

## 2024-09-24 DIAGNOSIS — N182 Chronic kidney disease, stage 2 (mild): Secondary | ICD-10-CM

## 2024-09-24 DIAGNOSIS — I10 Essential (primary) hypertension: Secondary | ICD-10-CM

## 2024-09-24 DIAGNOSIS — J3089 Other allergic rhinitis: Secondary | ICD-10-CM

## 2024-09-24 DIAGNOSIS — E66812 Obesity, class 2: Secondary | ICD-10-CM

## 2024-09-24 DIAGNOSIS — E1142 Type 2 diabetes mellitus with diabetic polyneuropathy: Secondary | ICD-10-CM

## 2024-09-24 DIAGNOSIS — E1169 Type 2 diabetes mellitus with other specified complication: Secondary | ICD-10-CM

## 2024-09-24 DIAGNOSIS — I5032 Chronic diastolic (congestive) heart failure: Secondary | ICD-10-CM

## 2024-09-24 DIAGNOSIS — I2510 Atherosclerotic heart disease of native coronary artery without angina pectoris: Secondary | ICD-10-CM | POA: Diagnosis not present

## 2024-09-24 DIAGNOSIS — E1165 Type 2 diabetes mellitus with hyperglycemia: Secondary | ICD-10-CM | POA: Diagnosis not present

## 2024-09-24 DIAGNOSIS — J4489 Other specified chronic obstructive pulmonary disease: Secondary | ICD-10-CM

## 2024-09-24 DIAGNOSIS — E785 Hyperlipidemia, unspecified: Secondary | ICD-10-CM

## 2024-09-24 DIAGNOSIS — Z6839 Body mass index (BMI) 39.0-39.9, adult: Secondary | ICD-10-CM

## 2024-09-24 DIAGNOSIS — R21 Rash and other nonspecific skin eruption: Secondary | ICD-10-CM

## 2024-09-24 DIAGNOSIS — K219 Gastro-esophageal reflux disease without esophagitis: Secondary | ICD-10-CM

## 2024-09-24 DIAGNOSIS — M545 Low back pain, unspecified: Secondary | ICD-10-CM | POA: Diagnosis not present

## 2024-09-24 DIAGNOSIS — Z794 Long term (current) use of insulin: Secondary | ICD-10-CM

## 2024-09-24 DIAGNOSIS — M109 Gout, unspecified: Secondary | ICD-10-CM

## 2024-09-24 LAB — POCT GLYCOSYLATED HEMOGLOBIN (HGB A1C): Hemoglobin A1C: 8.7 % — AB (ref 4.0–5.6)

## 2024-09-24 MED ORDER — KETOROLAC TROMETHAMINE 60 MG/2ML IM SOLN
60.0000 mg | Freq: Once | INTRAMUSCULAR | Status: AC
  Administered 2024-09-24: 60 mg via INTRAMUSCULAR

## 2024-09-24 MED ORDER — TIZANIDINE HCL 4 MG PO TABS
2.0000 mg | ORAL_TABLET | Freq: Three times a day (TID) | ORAL | 2 refills | Status: AC | PRN
  Filled 2024-09-24: qty 90, 20d supply, fill #0

## 2024-09-24 MED ORDER — TRIAMCINOLONE ACETONIDE 40 MG/ML IJ SUSP
40.0000 mg | Freq: Once | INTRAMUSCULAR | Status: AC
  Administered 2024-09-24: 40 mg via INTRAMUSCULAR

## 2024-09-24 MED FILL — Triamcinolone Acetonide Oint 0.1%: 1.0000 | CUTANEOUS | 30 days supply | Qty: 60 | Fill #0 | Status: AC

## 2024-09-24 NOTE — Progress Notes (Signed)
 BP (!) 189/71   Pulse 73   Temp 98.6 F (37 C)   Ht 5' 6 (1.676 m)   Wt 227 lb (103 kg)   SpO2 96%   BMI 36.64 kg/m    Subjective:    Patient ID: Cory Rivas, male    DOB: 02/23/1966, 58 y.o.   MRN: [id number]  HPI: Cory Rivas is a 58 y.o. male  Chief Complaint  Patient presents with   Medical Management of Chronic Issues    Pt c/o low back pain x1 day   Discussed the use of AI scribe software for clinical note transcription with the patient, who gave verbal consent to proceed.  History of Present Illness Cory Rivas is a 58 year old male with hypertension, coronary artery disease, and type 2 diabetes who presents for a three-month follow-up and reports lower back pain.  Acute lower back pain - Severe lower back pain for one day, onset after lifting wood over a balcony - Pain exacerbated by movement - Significant sleep disturbance, with only 45 minutes of rest last night - Ibuprofen  provided minimal relief - No muscle relaxer available at home - Pain medications cause nausea  Hypertension and coronary artery disease - History of hypertension and coronary artery disease, including previous NSTEMI and stent placement - Current antihypertensive regimen: valsartan  160 mg daily, amlodipine  10 mg daily, bisoprolol  10 mg in the morning and 5 mg at bedtime - Recent increase in antihypertensive medications by cardiologist - Persistent elevated blood pressure despite medication adjustment  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 - 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 - Last A1c 8.7, improved from 9.6 in August - Current regimen: Lantus  20-25 units at bedtime, Humalog  8 units three times daily before meals, Trulicity  3 mg weekly, Jardiance  10 mg daily - Blood glucose ranges from 80 to 140 mg/dL - Recent hyperglycemia during Thanksgiving due to dietary indiscretion  Chronic obstructive pulmonary disease and asthma -  COPD with asthma, followed by pulmonology - Uses Symbicort  and Duoneb as needed, Spiriva  two puffs daily - Since stent placement, no recent hospitalizations for breathing difficulties or chest pain  Hyperlipidemia - Managed with rosuvastatin  40 mg daily, Repatha  140 mg every two weeks, Zetia  10 mg daily - Last lipid panel: LDL 129 mg/dL, triglycerides 674 mg/dL  Chronic kidney disease - Chronic kidney disease with last GFR 71, improved from 51 - No current urinary symptoms - Recent episode of darker urine, now resolved  Mental health status - Mental health stable - On Cymbalta  60 mg in the morning and 30 mg at night  Gout - History of gout, managed with allopurinol  100 mg daily  Gastroesophageal reflux disease - GERD managed with Protonix  40 mg daily  Obesity and sleep apnea - Obesity - Sleep apnea with non-adherence to CPAP therapy  Wt Readings from Last 3 Encounters:  09/24/24 227 lb (103 kg)  08/14/24 223 lb 6.4 oz (101.3 kg)  07/21/24 225 lb (102.1 kg)   Body mass index is 36.64 kg/m.  Flowsheet Row Office Visit from 09/24/2024 in [facility]  1 47 inches           09/24/2024   10:45 AM 01/09/2024    1:26 PM 09/16/2023   10:12 AM  Depression screen PHQ 2/9  Decreased Interest 0 0 0  Down, Depressed, Hopeless 1 0 0  PHQ - 2 Score 1 0 0  Altered sleeping 2  2 2  Tired, decreased energy 3 2 2   Change in appetite 1 0 0  Feeling bad or failure about yourself  0 0 0  Trouble concentrating 1 1 1   Moving slowly or fidgety/restless 2 0 0  Suicidal thoughts 0 0 0  PHQ-9 Score 10 5  5    Difficult doing work/chores  Somewhat difficult Somewhat difficult     Data saved with a previous flowsheet row definition    Relevant past medical, surgical, family and social history reviewed and updated as indicated. Interim medical history since our last visit reviewed. Allergies and medications reviewed and updated.  Review of Systems  Ten systems  reviewed and is negative except as mentioned in HPI      Objective:      BP (!) 189/71   Pulse 73   Temp 98.6 F (37 C)   Ht 5' 6 (1.676 m)   Wt 227 lb (103 kg)   SpO2 96%   BMI 36.64 kg/m    Wt Readings from Last 3 Encounters:  09/24/24 227 lb (103 kg)  08/14/24 223 lb 6.4 oz (101.3 kg)  07/21/24 225 lb (102.1 kg)    Physical Exam VITALS: BP- 177/91 GENERAL: Alert, cooperative, well developed, no acute distress HEENT: Normocephalic, normal oropharynx, moist mucous membranes CHEST: Clear to auscultation bilaterally, no wheezes, rhonchi, or crackles CARDIOVASCULAR: Normal heart rate and rhythm, S1 and S2 normal without murmurs ABDOMEN: Soft, non-tender, non-distended, without organomegaly, normal bowel sounds EXTREMITIES: No cyanosis or edema NEUROLOGICAL: Cranial nerves grossly intact, moves all extremities without gross motor or sensory deficit  Results for orders placed or performed in visit on 09/24/24  POCT HgB A1C   Collection Time: 09/24/24 10:47 AM  Result Value Ref Range   Hemoglobin A1C 8.7 (A) 4.0 - 5.6 %   HbA1c POC (<> result, manual entry)     HbA1c, POC (prediabetic range)     HbA1c, POC (controlled diabetic range)            Assessment & Plan:   Problem List Items Addressed This Visit       Cardiovascular and Mediastinum   Essential hypertension   Chronic diastolic CHF (congestive heart failure) (HCC)   CAD (coronary artery disease)   RESOLVED: Accelerated hypertension - Primary     Respiratory   COPD with asthma (HCC)     Digestive   Gastroesophageal reflux disease without esophagitis     Endocrine   Hyperlipidemia associated with type 2 diabetes mellitus (HCC)   Uncontrolled diabetes mellitus with hyperglycemia, with long-term current use of insulin  (HCC)   Relevant Orders   POCT HgB A1C (Completed)   HM Diabetes Foot Exam (Completed)   Ambulatory referral to Ophthalmology   Type 2 diabetes mellitus with peripheral neuropathy  (HCC)   Relevant Medications   tiZANidine (ZANAFLEX) 4 MG tablet   Other Relevant Orders   POCT HgB A1C (Completed)     Genitourinary   RESOLVED: Chronic kidney disease (CKD) stage G2/A3, mildly decreased glomerular filtration rate (GFR) between 60-89 mL/min/1.73 square meter and albuminuria creatinine ratio greater than 300 mg/g     Other   Class 2 severe obesity with serious comorbidity and body mass index (BMI) of 39.0 to 39.9 in adult   Gout   HLD (hyperlipidemia)   Relevant Orders   POCT HgB A1C (Completed)   Other Visit Diagnoses       Acute bilateral low back pain without sciatica       Relevant  Medications   tiZANidine (ZANAFLEX) 4 MG tablet   ketorolac  (TORADOL ) injection 60 mg (Completed)   triamcinolone  acetonide (KENALOG -40) injection 40 mg (Completed)     Allergic rhinitis due to other allergic trigger, unspecified seasonality       Relevant Orders   Ambulatory referral to Allergy     Rash       Relevant Medications   triamcinolone  ointment (KENALOG ) 0.1 %        Assessment and Plan Assessment & Plan Acute low back pain Likely due to recent physical activity (putting wood). Pain is severe, affecting sleep and daily activities. Current muscle relaxer causes nausea and vomiting, limiting its use. 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 Blood sugar levels are generally well-controlled with current regimen. Stage 2 chronic kidney disease with recent improvement in GFR from 51 to 71. - Continue current diabetes management regimen including Lantus , Humalog , Trulicity , and Jardiance   Accelerated hypertension Blood pressure elevated at 177/91, likely secondary to  acute pain. Recent increase in valsartan  dosage by cardiologist. - Continue current antihypertensive regimen including valsartan , amlodipine , and hydralazine   Coronary artery disease Recent stent placement. Reports improved symptoms post-stent placement. - Continue current cardiac medications including Effient  and Ranexa   Chronic diastolic heart failure Chronic condition managed with current medications. - Continue current heart failure management regimen including spironolactone  and torsemide   COPD with asthma - Continue current respiratory medications including Symbicort , Spiriva , and albuterol  inhaler  Obesity, class 2 Obesity management not specifically discussed in this encounter. - Encourage continuation of lifestyle modifications, including dietary management and regular exercise. -continue to increase physical activity, getting at least 150 min of physical activity a week.  Work on including runner, broadcasting/film/video 2 days a week.  - continue eating at a calorie deficit 2000-2200 cal a day, eating a well balanced diet with whole foods, avoiding processed foods.   Patient is motivated to continue working on lifestyle modification.    Hyperlipidemia Managed with current medications. Recent lipid panel showed LDL of 129 and triglycerides of 325. - Continue current lipid-lowering regimen including rosuvastatin , Repatha , and Zetia   Gout Managed with allopurinol . - Continue allopurinol  100 mg daily  Allergic rhinitis Symptoms possibly exacerbated by environmental factors such as fireplace smoke. - Ordered allergy testing - Prescribed a cream for symptomatic relief  Rash Possibly related to allergies or nerve issues from neck injury. - Prescribed a cream for rash management  General Health Maintenance Up to date on colonoscopy and lung cancer screening. Due for A1c and diabetic foot exam. - Ordered A1c test - Scheduled diabetic foot exam        Follow up plan: Return in about  4 months (around 01/23/2025) for follow up.

## 2024-09-25 ENCOUNTER — Ambulatory Visit: Admitting: "Endocrinology

## 2024-09-25 ENCOUNTER — Telehealth: Payer: Self-pay | Admitting: Student in an Organized Health Care Education/Training Program

## 2024-09-25 ENCOUNTER — Other Ambulatory Visit: Payer: Self-pay

## 2024-09-25 ENCOUNTER — Other Ambulatory Visit: Payer: Self-pay | Admitting: Nurse Practitioner

## 2024-09-25 MED FILL — Ezetimibe Tab 10 MG: ORAL | 30 days supply | Qty: 30 | Fill #3 | Status: AC

## 2024-09-25 MED FILL — Duloxetine HCl Enteric Coated Pellets Cap 30 MG (Base Eq): ORAL | 30 days supply | Qty: 90 | Fill #3 | Status: AC

## 2024-09-25 MED FILL — Allopurinol Tab 100 MG: ORAL | 30 days supply | Qty: 30 | Fill #3 | Status: AC

## 2024-09-25 MED FILL — Rosuvastatin Calcium Tab 40 MG: ORAL | 30 days supply | Qty: 30 | Fill #3 | Status: AC

## 2024-09-25 NOTE — Telephone Encounter (Signed)
 Dr. Isadora you placed an order for new cpap setup for this patient. The order was sent to [facility]. Stacy with [facility] stated the sleep study was too old and a new sleep study would need to be done. Dr. Isadora placed the order for split night to be done. As of today the patient has not scheduled with [facility] to do split night and [facility] will void the order until the sleep study has been done

## 2024-09-25 NOTE — Telephone Encounter (Signed)
 Noted. Nothing further needed.

## 2024-09-26 ENCOUNTER — Other Ambulatory Visit: Payer: Self-pay

## 2024-09-27 ENCOUNTER — Other Ambulatory Visit: Payer: Self-pay

## 2024-09-28 ENCOUNTER — Other Ambulatory Visit: Payer: Self-pay

## 2024-09-28 ENCOUNTER — Other Ambulatory Visit: Payer: Self-pay | Admitting: Nurse Practitioner

## 2024-09-28 DIAGNOSIS — J3089 Other allergic rhinitis: Secondary | ICD-10-CM

## 2024-09-28 MED ORDER — RANOLAZINE ER 500 MG PO TB12
500.0000 mg | ORAL_TABLET | Freq: Two times a day (BID) | ORAL | 3 refills | Status: AC
  Filled 2024-09-28: qty 60, 30d supply, fill #0
  Filled 2024-11-09 – 2024-11-13 (×2): qty 60, 30d supply, fill #1

## 2024-09-28 MED ORDER — LEVOCETIRIZINE DIHYDROCHLORIDE 5 MG PO TABS
5.0000 mg | ORAL_TABLET | Freq: Every evening | ORAL | 1 refills | Status: AC
  Filled 2024-09-28: qty 90, 90d supply, fill #0
  Filled 2024-09-28: qty 30, 30d supply, fill #0
  Filled 2024-11-09 – 2024-11-13 (×2): qty 30, 30d supply, fill #1

## 2024-09-28 NOTE — Telephone Encounter (Signed)
 Requested medication (s) are due for refill today: yes  Requested medication (s) are on the active medication list: yes  Last refill:  05/17/24  Future visit scheduled: yes  Notes to clinic:  Unable to refill per protocol, cannot delegate.      Requested Prescriptions  Pending Prescriptions Disp Refills   ranolazine  (RANEXA ) 500 MG 12 hr tablet 60 tablet 2    Sig: Take 1 tablet (500 mg total) by mouth 2 (two) times daily.     Not Delegated - Cardiovascular: Ranolazine  Failed - 09/28/2024  2:52 PM      Failed - This refill cannot be delegated      Failed - Last BP in normal range    BP Readings from Last 1 Encounters:  09/24/24 (!) 189/71         Passed - K in normal range and within 360 days    Potassium  Date Value Ref Range Status  08/14/2024 4.2 3.5 - 5.2 mmol/L Final         Passed - Cr in normal range and within 360 days    Creat  Date Value Ref Range Status  02/10/2024 1.30 0.70 - 1.30 mg/dL Final   Creatinine, Ser  Date Value Ref Range Status  08/14/2024 1.19 0.76 - 1.27 mg/dL Final   Creatinine, Urine  Date Value Ref Range Status  05/29/2024 27 20 - 320 mg/dL Final         Passed - Valid encounter within last 12 months    Recent Outpatient Visits           4 days ago Accelerated hypertension   [facility] Gareth Clarity F, FNP   4 months ago Uncontrolled diabetes mellitus with hyperglycemia, with long-term current use of insulin  [facility])   [facility] Leavy Mole, PA-C   4 months ago Allergic rash present on examination   [facility] [name], DO   7 months ago Hospital discharge follow-up   [facility] Leavy Mole, PA-C   7 months ago Exacerbation of persistent asthma, unspecified asthma severity   [facility] Leavy Mole, PA-C       Future Appointments             In 3 weeks Lorene Lesley CROME, PA-C [facility] Prescriptions Disp Refills   levocetirizine (XYZAL ) 5 MG tablet 90 tablet 1    Sig: Take 1 tablet (5 mg total) by mouth every evening.     Ear, Nose, and Throat:  Antihistamines - levocetirizine dihydrochloride  Passed - 09/28/2024  2:52 PM      Passed - Cr in normal range and within 360 days    Creat  Date Value Ref Range Status  02/10/2024 1.30 0.70 - 1.30 mg/dL Final   Creatinine, Ser  Date Value Ref Range Status  08/14/2024 1.19 0.76 - 1.27 mg/dL Final   Creatinine, Urine  Date Value Ref Range Status  05/29/2024 27 20 - 320 mg/dL Final         Passed - eGFR is 10 or above and within 360 days    GFR, Est African American  Date Value Ref Range Status  09/30/2020 83 > OR = 60 mL/min/1.70m2 Final   GFR, Est Non African American  Date Value Ref Range Status  09/30/2020 71 > OR = 60 mL/min/1.71m2 Final  GFR, Estimated  Date Value Ref Range Status  07/07/2024 47 (L) >60 mL/min Final    Comment:    (NOTE) Calculated using the CKD-EPI Creatinine Equation (2021)    eGFR  Date Value Ref Range Status  08/14/2024 71 >59 mL/min/1.73 Final         Passed - Valid encounter within last 12 months    Recent Outpatient Visits           4 days ago Accelerated hypertension   [facility] Gareth Clarity F, FNP   4 months ago Uncontrolled diabetes mellitus with hyperglycemia, with long-term current use of insulin  [facility])   [facility] Leavy Mole, PA-C   4 months ago Allergic rash present on examination   [facility] [name], DO   7 months ago Hospital discharge follow-up   [facility] Leavy Mole, PA-C   7 months ago Exacerbation of persistent asthma, unspecified asthma severity   [facility] Leavy Mole, PA-C       Future Appointments             In 3 weeks Lorene Lesley CROME,  PA-C [facility]

## 2024-09-28 NOTE — Telephone Encounter (Signed)
°  From : Norene Bouton ,[facility] Hello Lesley, we are getting ready to do this patient's adherence packs and needs refills on his Renexa. Is that something you can send a refill for, to [facility] pharmacy. Thankyou   From: Lesley Maffucci, PA-C [facility] could you send in a refill for this patient's Ranexa  500 mg BID?   Addalee Kavanagh BETHA Spear

## 2024-10-06 ENCOUNTER — Ambulatory Visit: Attending: Sleep Medicine

## 2024-10-06 DIAGNOSIS — G478 Other sleep disorders: Secondary | ICD-10-CM | POA: Diagnosis not present

## 2024-10-06 DIAGNOSIS — G4761 Periodic limb movement disorder: Secondary | ICD-10-CM | POA: Insufficient documentation

## 2024-10-06 DIAGNOSIS — G4733 Obstructive sleep apnea (adult) (pediatric): Secondary | ICD-10-CM | POA: Insufficient documentation

## 2024-10-06 DIAGNOSIS — I493 Ventricular premature depolarization: Secondary | ICD-10-CM | POA: Insufficient documentation

## 2024-10-14 ENCOUNTER — Other Ambulatory Visit: Payer: Self-pay

## 2024-10-16 ENCOUNTER — Telehealth: Payer: Self-pay | Admitting: Family

## 2024-10-16 ENCOUNTER — Other Ambulatory Visit (HOSPITAL_BASED_OUTPATIENT_CLINIC_OR_DEPARTMENT_OTHER): Payer: Self-pay

## 2024-10-16 NOTE — Telephone Encounter (Signed)
 Called to confirm/remind patient of [facility] on [facility].   Appointment:   [] Confirmed  [x] Left mess   [] No answer/No voice mail  [] VM Full/unable to leave message  [] Phone not in service  Patient reminded to bring all medications and/or complete list.  Confirmed patient has transportation. Gave directions, instructed to utilize valet parking.

## 2024-10-16 NOTE — Progress Notes (Unsigned)
 "  Advanced Heart Failure Clinic Note    PCP: Leavy Mole, PA-C Primary Cardiologist: Redell Cave, MD/ Abigail Motto, [place]   Chief Complaint: fatigue   HPI:  [name] is a 59 y/o male with a history of DM, hyperlipidemia, tracheobronchomalacia, nonobstructive CAD, HTN, gout, COPD, asthma, CKD, dyslipidemia, tobacco use and chronic heart failure.   Echo 11/04/20: EF 60-65% with Grade I DD, mild LVH/ LAE and normal PA pressure of 31.6 mmHg  Admitted 08/23/22 due to dyspnea, productive cough of brown sputum, mild sore throat, fatigue, dizziness and chest soreness. Steroid burst given. Initially given IV lasix  with transition to oral diuretics. Echo 08/23/22: EF of 60-65% along with mild LVH & mild MR. Lisinopril  held due to AKI. Discharged after 2 days. Admitted 11/22/22 due to acute on chronic HF. Weaned off of bipap.Was in the ED 03/01/23 due to neck pain after falling off his tractor after he hit a stump. He landed on his right side.Head CT/ cervical spine xrays were negative. Was in the ED 03/09/23 due to atypical chest pain. Chest CTA negative for PE. Was in the ED 03/22/23 due to nonspecific chest pain. Workup negative. Was in the ED 03/23/23 due to hyperglycemia with glucose of >400.    Admitted 05/27/23 due to acute onset of chest pain, shortness of breath and diaphoresis. Blood pressure was significantly elevated to 204/102 on admission. Troponins were mildly elevated up to 26. Cardiology was consulted and he underwent cardiac catheterization 8/14 which revealed non-obstructive coronary artery disease. Meds adjusted. Chest x-ray without acute cardiopulmonary process. Echo on 05/28/2023 showed an EF of 50 to 55%, no regional wall motion abnormalities, moderate LVH, grade 3 diastolic dysfunction, elevated left atrial pressure, normal RV systolic function and ventricular cavity size, mildly dilated left atrium, mild mitral regurgitation, aortic valve sclerosis without evidence of stenosis, and an estimated  right atrial pressure of 3 mmHg.   Admitted 08/19/23 with chest pain/ pressure for 2 days located in the front chest. Worse with deep breath. Has shortness of breath and cough along with this. Negative D-dimer. Troponin 23 => 22. 1 dose of IV Lasix  as BNP 143.9. Glucose 409. Was in the ED 08/31/23 with central chest pressure along with shortness of breath & cough. Labs reassuring with normal troponin. Negative D-dimer. CXR concerning for pneumonia so antibiotics provided.   Admitted 10/21/23 due to acute hypoxic respiratory failure secondary to COPD exacerbation and acute on chronic HFpEF. Found to have worsening CKD. High-sensitivity troponin 21 with a delta troponin of 20 subsequently trended to 19. BNP 331. D-dimer negative. Supplemental oxygen given. Initially given IV lasix  with transition to increased home torsemide . Lisinopril , spironolactone , and torsemide  were held due to AKI.    R/LHC 10/30/23:  Moderate LAD and OM1 disease, not significantly different compared to prior catheterization in 05/2023.  RFR of sequential ostial and proximal LAD stenosis at that time was not hemodynamically significant. Normal left and right heart filling pressures. Mild pulmonary hypertension. Normal Fick cardiac output/index.        Echo 11/12/23: EF 55-60%, normal RV, mild MR  Admitted 01/20/24 with worsening cough, wheezing & shortness of breath. Gained 10 pounds in the last month.IV diuresed. Chest x-ray showed pulmonary congestion as well as chronic interstitial changes compatible with COPD. Meds changed.   Admitted 02/17/24 with cough, shortness of breath, orthopnea, exertional dyspnea, chest tightness, and leg swelling. Patient has gained 7-8 pounds since previous discharge dry weight. On admission, BNP was 300.0 and HS-troponin was 27. Chest  x-ray noted mild cardiomegaly and interstitial edema. Placed on bipap, given IV lasix . Torsemide , lisinopril  and bisoprolol  all held  Seen in [facility] 05/25 and had lisinopril   switched to valsartan  40mg  daily and torsemide  40mg  daily resumed.   Seen in ED 04/02/24 due to chest tightness and SOB which resolved by the time of ED presentation. Labs unremarkable. Bisoprolol  increased to 10mg .   Admitted 04/03/24 with SOB which was worse w/ ambulation or sitting up. No improvement with nebulizer treatment. Pulmonary edema noted on CXR. Troponins mildly flat and stable. IV diuresed with improvement of symptoms. Remains off CPAP.   Was in the ED 04/12/24 due to SOB with associated chest pressure. Placed on CPAP by EMS and given ASA with resolution of chest pain. Since patient was feeling better, he declined admission.   Was in the 05/17/24 due to chest pain. Prior to admission, took baby aspirin  but symptoms return prompting ED visit. CXR showed no cardiopulmonary disease. Started on ranexa  and discharged.   Was in the ED 06/04/24 due to GI complication.   Was in the ED 06/08/24 due to acute chest pain. EKG initally showed NSR with T wave inversion inferiorly and laterally. Troponin increased during visit from 19 to 47, concerning for NSTEMI. CXR no cardiopulomary disease. CT Chest negative PE. Patient given IV fentanyl  and sublingual nitroglycerin  which relieved chest pain. Echo 06/08/24 LV 55-60%. LV and RV normal function. L/R [facility] 06/08/24 with LAD stent placement. Antiplatelet therapy started.   Was in the ED 07/07/24 due to GI complication.   Seen by [facility] cardiology 10/25 where valsartan  was increased to 160mg  daily and cardiac referral placed.   He presents today for a HF follow-up visit with a chief complaint of minimal fatigue. Has intermittent left leg cramp and severe neck / back pain and has upcoming appt at [facility] on 10/30/23 to discuss this further. Says that on a pain scale of 0-10, his neck pain is a 12. Denies any shortness of breath, chest pain, palpitations, edema, HA, blurry vision, numbness / tingling in extremities. Gets his medications pill packed but didn't bring  them. When looking at dispense history, there appears to be discrepancies with when some were filled. Patient denies missing any medication doses.   KATHEE Gosling, RN called [facility] pharmacy and says that he has new pill packs ready. They said that valsartan  was stopped on 08/05/24.    NAS. Drinks more than 4 water bottles a day. Smoking 1ppd, previous 2ppd, using nicotene lozenge which he states are helping. Denies alcohol use.   ROS: All systems negative except as listed in HPI, PMH and Problem List.  SH:  Social History   Socioeconomic History   Marital status: Divorced    Spouse name: Not on file   Number of children: 0   Years of education: 9   Highest education level: 9th grade  Occupational History   Occupation: disability  Tobacco Use   Smoking status: Every Day    Current packs/day: 0.50    Average packs/day: 1 pack/day for 38.5 years (37.8 ttl pk-yrs)    Types: Cigarettes    Start date: 10/19/1989    Last attempt to quit: 08/20/2022   Smokeless tobacco: Never   Tobacco comments:    30+ years hx smoking 2 ppd, stopped smoking briefly in April 2022 for a surgery and then restarted smoking.  As of August 2025, he smoking about 2 to 4 cigarettes a day.  Vaping Use   Vaping status: Never Used  Substance and Sexual Activity   Alcohol use: No   Drug use: No   Sexual activity: Not Currently    Partners: Female  Other Topics Concern   Not on file  Social History Narrative   Lives with sister [name], brother in law [name]), and his mother - Faiz Weber   Does not routinely exercise.   Social Drivers of Health   Tobacco Use: High Risk (09/24/2024)   Patient History    Smoking Tobacco Use: Every Day    Smokeless Tobacco Use: Never    Passive Exposure: Not on file  Financial Resource Strain: High Risk (01/21/2024)   Overall Financial Resource Strain (CARDIA)    Difficulty of Paying Living Expenses: Hard  Food Insecurity: No Food Insecurity (04/04/2024)   Epic    Worried About  Programme Researcher, Broadcasting/film/video in the Last Year: Never true    Ran Out of Food in the Last Year: Never true  Transportation Needs: No Transportation Needs (04/04/2024)   Epic    Lack of Transportation (Medical): No    Lack of Transportation (Non-Medical): No  Physical Activity: Insufficiently Active (12/06/2022)   Exercise Vital Sign    Days of Exercise per Week: 3 days    Minutes of Exercise per Session: 10 min  Stress: No Stress Concern Present (12/06/2022)   [employer] of Occupational Health - Occupational Stress Questionnaire    Feeling of Stress : Only a little  Social Connections: Socially Isolated (02/17/2024)   Social Connection and Isolation Panel    Frequency of Communication with Friends and Family: More than three times a week    Frequency of Social Gatherings with Friends and Family: Twice a week    Attends Religious Services: Never    Database Administrator or Organizations: No    Attends Banker Meetings: Never    Marital Status: Divorced  Catering Manager Violence: Not At Risk (04/04/2024)   Epic    Fear of Current or Ex-Partner: No    Emotionally Abused: No    Physically Abused: No    Sexually Abused: No  Depression (PHQ2-9): Medium Risk (09/24/2024)   Depression (PHQ2-9)    PHQ-2 Score: 10  Alcohol Screen: Low Risk (12/06/2022)   Alcohol Screen    Last Alcohol Screening Score (AUDIT): 0  Housing: Unknown (04/04/2024)   Epic    Unable to Pay for Housing in the Last Year: No    Number of Times Moved in the Last Year: Not on file    Homeless in the Last Year: No  Utilities: Not At Risk (04/04/2024)   Epic    Threatened with loss of utilities: No  Health Literacy: Not on file    FH:  Family History  Problem Relation Age of Onset   Heart disease Mother    Diabetes Mother    Hyperlipidemia Mother    Hypertension Mother    Heart attack Mother    Lung cancer Father    Hypertension Father    Stroke Father    AAA (abdominal aortic aneurysm) Maternal  Grandmother    Heart attack Maternal Grandmother    Diabetes Maternal Grandfather     Past Medical History:  Diagnosis Date   Bulging of cervical intervertebral disc    CAD (coronary artery disease)    a. 09/2020 MV: low risk; b. 05/2023 Cath: Moderate LAD dzs w/ seq 50-60% stenoses and nl RFR 0.93. Mod OM dzs->Med rx; b. 10/2023 Cath: LM min irregs, LAD 55ost, 55p/m, LCX  large nl, OM1 50, RCA mod, nl.   Cervical myelopathy (HCC)    Chronic heart failure with preserved ejection fraction (HFpEF) (HCC)    a. 05/2023 Echo: EF 50-55%, no rwma, GrIII DD, nl RV fxn, mild MR, Ao sclerosis; b. 10/2023 Echo: EF 55-60%, no rwma, nl RV fxn, mild MR, Ao sclerosis.   CKD (chronic kidney disease), stage III (HCC)    COPD (chronic obstructive pulmonary disease) (HCC)    Diabetes mellitus without complication (HCC)    Gout    Hyperlipidemia LDL goal <70    Hypertension    Morbid obesity (HCC)    OSA (obstructive sleep apnea)    Stress due to family tension 04/15/2019   Tracheobronchomalacia     Current Outpatient Medications  Medication Sig Dispense Refill   allopurinol  (ZYLOPRIM ) 100 MG tablet Take 1 tablet (100 mg total) by mouth daily with the 300mg  tablet for a total daily dose of 400mg . 90 tablet 1   allopurinol  (ZYLOPRIM ) 300 MG tablet Take 1 tablet (300 mg total) by mouth daily as needed (Gout flare). Home med.     amLODipine  (NORVASC ) 10 MG tablet Take 1 tablet (10 mg total) by mouth daily. 90 tablet 3   aspirin  EC 81 MG tablet Take 1 tablet (81 mg total) by mouth daily. Swallow whole. 90 tablet 3   bisoprolol  (ZEBETA ) 5 MG tablet Take 2 tablets (10 mg total) by mouth every morning AND 1 tablet (5 mg total) at bedtime. 270 tablet 3   budesonide -formoterol  (SYMBICORT ) 160-4.5 MCG/ACT inhaler Inhale 2 puffs into the lungs 2 (two) times daily. (Patient taking differently: Inhale 2 puffs into the lungs 2 (two) times daily. As needed) 10.2 g 12   Continuous Glucose Sensor (FREESTYLE LIBRE 3 SENSOR)  MISC Place 1 sensor onto skin every 14 (fourteen) days to monitor blood sugar continuously 6 each 2   cromolyn  (OPTICROM ) 4 % ophthalmic solution Place 1 drop into both eyes 4 (four) times daily as needed. 10 mL 2   Dulaglutide  (TRULICITY ) 3 MG/0.5ML SOAJ Inject 3 mg into the skin once a week. 2 mL 10   DULoxetine  (CYMBALTA ) 30 MG capsule Take 2 capsules (60 mg total) by mouth every morning AND 1 capsule (30 mg total) every evening. 270 capsule 1   empagliflozin  (JARDIANCE ) 10 MG TABS tablet Take 1 tablet (10 mg total) by mouth daily. 30 tablet 5   Evolocumab  (REPATHA  SURECLICK) 140 MG/ML SOAJ Inject 140 mg into the skin every 14 (fourteen) days. 2 mL 2   ezetimibe  (ZETIA ) 10 MG tablet Take 1 tablet (10 mg total) by mouth daily. 90 tablet 1   fluticasone  (FLONASE ) 50 MCG/ACT nasal spray Place 1 spray into both nostrils daily. 16 g 6   Glucagon  (BAQSIMI  ONE PACK) 3 MG/DOSE POWD Place 1 Device into the nose as needed (Low blood sugar with impaired consciousness). 2 each 3   hydrALAZINE  (APRESOLINE ) 25 MG tablet Take 1 tablet (25 mg total) by mouth in the morning and at bedtime. 60 tablet 5   insulin  glargine (LANTUS  SOLOSTAR) 100 UNIT/ML Solostar Pen Inject 20-25 Units into the skin at bedtime. 30 mL 1   insulin  lispro (HUMALOG ) 100 UNIT/ML KwikPen Inject 10 Units into the skin 3 (three) times daily. Plus sliding scale up per instructions 27 mL 1   ipratropium-albuterol  (DUONEB) 0.5-2.5 (3) MG/3ML SOLN Take 3 mLs by nebulization every 6 (six) hours as needed. 180 mL 1   levocetirizine (XYZAL ) 5 MG tablet Take 1 tablet (5 mg  total) by mouth every evening. 90 tablet 1   pantoprazole  (PROTONIX ) 40 MG tablet Take 1 tablet (40 mg total) by mouth daily. 90 tablet 1   prasugrel  (EFFIENT ) 10 MG TABS tablet Take 1 tablet (10 mg total) by mouth daily. 90 tablet 3   pregabalin  (LYRICA ) 50 MG capsule Take 1 capsule (50 mg total) by mouth 2 (two) times daily as needed. Home med.     ranolazine  (RANEXA ) 500 MG 12  hr tablet Take 1 tablet (500 mg total) by mouth 2 (two) times daily. 60 tablet 3   rosuvastatin  (CRESTOR ) 40 MG tablet Take 1 tablet (40 mg total) by mouth daily. 90 tablet 1   spironolactone  (ALDACTONE ) 25 MG tablet Take 1 tablet (25 mg total) by mouth daily. 90 tablet 3   sucralfate  (CARAFATE ) 1 g tablet Take 1 tablet (1 g total) by mouth 4 (four) times daily -  with meals and at bedtime. 120 tablet 0   Tiotropium Bromide  Monohydrate (SPIRIVA  RESPIMAT) 2.5 MCG/ACT AERS Inhale 2 puffs into the lungs daily. 4 g 11   tiZANidine  (ZANAFLEX ) 4 MG tablet Take 0.5-1.5 tablets (2-6 mg total) by mouth every 8 (eight) hours as needed for muscle spasms (muscle tightness). 90 tablet 2   torsemide  (DEMADEX ) 20 MG tablet Take 2 tablets (40 mg total) by mouth daily. 60 tablet 6   triamcinolone  ointment (KENALOG ) 0.1 % Apply 1 Application topically 2 (two) times daily. To affected areas 60 g 0   valsartan  (DIOVAN ) 160 MG tablet Take 1 tablet (160 mg total) by mouth daily. 90 tablet 3   VENTOLIN  HFA 108 (90 Base) MCG/ACT inhaler Inhale 2 puffs into the lungs every 6 (six) hours as needed for wheezing or shortness of breath. 18 g 2   No current facility-administered medications for this visit.   Vitals:   10/19/24 1441  BP: (!) 204/106  Pulse: 64  SpO2: 97%  Weight: 223 lb 3.2 oz (101.2 kg)   Wt Readings from Last 3 Encounters:  10/19/24 223 lb 3.2 oz (101.2 kg)  10/19/24 226 lb (102.5 kg)  09/24/24 227 lb (103 kg)   Lab Results  Component Value Date   CREATININE 1.19 08/14/2024   CREATININE 1.58 (H) 07/21/2024   CREATININE 1.69 (H) 07/07/2024    PHYSICAL EXAM:  General: Appears uncomfortable.  Cor: No JVD. Regular rhythm, rate.  Lungs: clear Abdomen: soft, nontender, nondistended. Extremities: no edema Neuro:. Affect pleasant. Neck has limited movement.    ECG 08/14/24: NSR   ASSESSMENT & PLAN:  1: NICM with preserved ejection fraction- - suscpect due to HTN and OSA - NYHA class  II - euvolemic - weight down 2 pounds from last visit here 3 months ago - encourgaed to maintain fluid restriction between 60-64oz daily - Echo 11/04/20: EF 60-65% with Grade I DD, mild LVH/ LAE and normal PA pressure of 31.6 mmHg - Echo 08/23/22: EF of 60-65% along with mild LVH & mild MR. - Echo 05/28/23: EF 50-55% with moderate LVH, Grade III DD and mild MR - Echo 11/12/23: EF 55-60%, normal RV, mild MR - Echo 06/08/24 LV 55-60% - continue bisoprolol  10mg  am/ 5mg  PM - continue jardiance  10mg  every day - continue spironolactone  25mg  daily - continue torsemide  40mg  daily  - found through calling pharmacy that valsartan  had been stopped for some reason. Will restart valsartan  160mg  daily. He says that he can go pick it up today.  - BNP 05/17/24 was 32.4  - emphasized, again, the importance of  bringing medication packs to every visit  - Discussed risk reduction and regimen adherence to limit hospital admissions   2: HTN- - BP extremely elevated 204/ 106 and no improvement upon recheck w/ manual cuff. Certainly being without valsartan  and his severe neck pain is contributing to this. Discussed that with his elevated BP, he should go to the ER for further treatment but he declines. He says that he has groceries in the car that he needs to get home and he will go home, take his valsartan , eat and then relax. He will check his BP later today (has a wrist cuff). If it remains elevated, he confirms that he will go to the ER. Reviewed stroke risk with his elevated BP in clinic but, again, he declines going to the ER from clinic.  - resumed valsartan , continue amlodipine , bisoprolol , hydralazine , spironolactone  - saw PCP 12/25 - BMET 08/14/24 reviewed: sodium 136, potassium 4.2, creatinine 1.19 & GFR 71 - BMET today  3: DM- - A1c 09/24/24 was 8.7% - saw endocrinology [place]) 10/25 - 05/29/24 microalbumin/ creat ratio was 1381  4:OSA- - sleep study done 10/06/24. + OSA with AHI of 5.9 - waiting on  CPAP equipment - smoking daily - 1ppd, cessation encouraged  - saw pulmonology ([place]) 09/25  5: Hyperlipidemia- - continue ezetimibe  10mg  daily - continue repatha  every 2 weeks - continue rosuvastatin  40mg  daily - lipid panel 08/14/24 showed LDL 129, triglycerides 325. Referred to lipid clinic  6: CAD- - LDL 06/08/24 was 101 - saw cardiology 01/26 (earlier today) - continue ezetimibe  10mg  daily - continue rosuvastatin  40mg  daily - [facility] 10/30/23:    Moderate LAD and OM1 disease, not significantly different compared to prior catheterization in 05/2023.  RFR of sequential ostial and proximal LAD stenosis at that time was not hemodynamically significant.   Normal left and right heart filling pressures.   Mild pulmonary hypertension.   Normal Fick cardiac output/index.   Return in 1 week, sooner if needed. Again, reinforced going to the ER if BP remains elevated or he develops a HA, blurry vision etc. Patient declines going to the ER from clinic.   I spent 35 minutes reviewing records, interviewing/ examing patient and managing plan/ orders.   Ellouise DELENA Class, FNP 10/18/2024 "

## 2024-10-16 NOTE — Progress Notes (Signed)
 "  Cardiology Office Note    Date:  10/19/2024   ID:  Cory Rivas, DOB May 14, 1966, MRN [id number]  PCP:  Gareth Mliss FALCON, FNP  Cardiologist:  Redell Cave, MD  Electrophysiologist:  None   Chief Complaint: Follow up  History of Present Illness:   Cory Rivas is a 59 y.o. male with history of moderate nonobstructive CAD, chronic HFpEF, stage IIIa chronic kidney disease, insulin -dependent diabetes, hypertension, hyperlipidemia, morbid obesity, tobacco abuse, COPD, gout, sleep apnea, tracheobronchomalacia, and cervical myelopathy who presents for follow-up on CAD and chronic HFpEF.    Mr. Coutant cardiac history dates back to 2021 when he underwent stress testing 09/2020 as a part of preoperative evaluation which was low risk but did show coronary calcification on CT imaging.  Echo 10/2020 showed EF 60 to 65% with G1 DD.   He was hospitalized at [facility] 05/2023 in the setting of chest pain, dyspnea, and hypertensive urgency.  Troponin was mildly elevated and peaked at 26.  Echo showed EF 50 to 55% without RWMA, moderate LVH, G3 DD.  Diagnostic catheterization revealed moderate nonobstructive CAD including sequential 50 to 60% ostial proximal LAD stenosis that were not hemodynamically significant by RFR (0.93).  Medical therapy and ongoing diuresis were recommended.  He was readmitted in 08/2023 for hypertensive urgency and HFpEF requiring diuresis.  He was subsequently treated for pneumonia in 08/2023.   In 10/2023 he was admitted to [facility] with respiratory failure, COPD exacerbation, HFpEF, and acute on chronic stage III kidney disease.  He required diuresis, steroids, nebulizers, and antibiotics.  Following discharge, he reported ongoing chest discomfort office follow-up and underwent diagnostic catheterization 10/2023 showing overall stable anatomy with recommendation for escalation of medical therapy (amlodipine  increased to 10 mg daily).  He was again seen in the ED 12/2023 and admitted in  01/2024 with atypical chest pain, mild troponin elevation, COPD, and HFpEF.  He was admitted again in 03/2024 in the setting of acute on chronic HFpEF and chest pressure.  Troponins were again mildly elevated with flat trend.  He was diuresed and subsequently discharged.  He was again hospitalized 05/17/2024 in the setting of chest discomfort.  At that time, he reported almost daily retrosternal chest discomfort occurring at rest and with activity.  Troponins were mildly elevated and flat trending.  Echo was ordered but not completed as patient requested to be discharged home.  He was started on Ranexa  for further management of angina.  He was discharged home in stable condition on 8/3.  Patient was admitted to [facility] 06/08/2024 with chest pain.  Troponin 19 > 47 > 1600.  Chest x-ray negative.  CTA of the chest without aortic abnormality and no PE.  EKG showed sinus rhythm with inferolateral ST/T wave abnormalities.  Left heart catheterization revealed severe one-vessel CAD with evidence of in the proximal LAD at the bifurcation of a large diagonal branch likely due to plaque rupture with successful PCI/DES to the LAD and PCI to the ostial diagonal.  He was started on DAPT with aspirin  and prasugrel  for at least 12 months.  Echo showed EF 55 to 60% with moderate LVH, G2 DD, and mild MR.   Patient was most recently seen in our office 08/14/2024 overall doing well.  He reported some episodes of dyspnea during which he was overexerting himself.  He was interested in starting cardiac rehab.  He was not compliant with the CPAP and insurance stopped covering it so it was no longer available.  Blood pressure  was elevated and valsartan  was subsequently increased to 160 mg daily.  Lipid panel was drawn and revealed LDL of 129.  Patient was subsequently referred to the lipid clinic.  Patient presents today overall doing well from a cardiac perspective without symptoms of angina or cardiac decompensation.  He reports a notable  difference since having his stent placed in August with significant improvement in quality of life.  He notes improvement in dyspnea and resolution of chest pain.  He stays fairly active taking care of his dogs and keeping busy around the house.  He denies chest pain, palpitations, lightheadedness, dizziness, orthopnea, PND, and lower extremity swelling.  His blood pressure has been running high for the past few months since injuring his neck.  It was previously well-controlled.  He has an upcoming appointment with a physician at [facility] to discuss further options for pain management of his neck.  Labs independently reviewed: 07/2024-BUN 13, creatinine 1.19, sodium 136, potassium 4.2, TC 226, TG 325, HDL 39, LDL 129 06/2024-Hgb 16.1, HCT 46.6, platelets 226  Objective   Past Medical History:  Diagnosis Date   Bulging of cervical intervertebral disc    CAD (coronary artery disease)    a. 09/2020 MV: low risk; b. 05/2023 Cath: Moderate LAD dzs w/ seq 50-60% stenoses and nl RFR 0.93. Mod OM dzs->Med rx; b. 10/2023 Cath: LM min irregs, LAD 55ost, 55p/m, LCX large nl, OM1 50, RCA mod, nl.   Cervical myelopathy (HCC)    Chronic heart failure with preserved ejection fraction (HFpEF) (HCC)    a. 05/2023 Echo: EF 50-55%, no rwma, GrIII DD, nl RV fxn, mild MR, Ao sclerosis; b. 10/2023 Echo: EF 55-60%, no rwma, nl RV fxn, mild MR, Ao sclerosis.   CKD (chronic kidney disease), stage III (HCC)    COPD (chronic obstructive pulmonary disease) (HCC)    Diabetes mellitus without complication (HCC)    Gout    Hyperlipidemia LDL goal <70    Hypertension    Morbid obesity (HCC)    OSA (obstructive sleep apnea)    Stress due to family tension 04/15/2019   Tracheobronchomalacia     Current Medications: Active Medications[1]  Allergies:   Oxycodone , Entresto  [sacubitril -valsartan ], Onion, and Nitroglycerin    Social History   Socioeconomic History   Marital status: Divorced    Spouse name: Not on file   Number  of children: 0   Years of education: 9   Highest education level: 9th grade  Occupational History   Occupation: disability  Tobacco Use   Smoking status: Every Day    Current packs/day: 0.50    Average packs/day: 1 pack/day for 38.6 years (37.8 ttl pk-yrs)    Types: Cigarettes    Start date: 10/19/1989    Last attempt to quit: 08/20/2022   Smokeless tobacco: Never   Tobacco comments:    30+ years hx smoking 2 ppd, stopped smoking briefly in April 2022 for a surgery and then restarted smoking.  As of August 2025, he smoking about 2 to 4 cigarettes a day.  Vaping Use   Vaping status: Never Used  Substance and Sexual Activity   Alcohol use: No   Drug use: No   Sexual activity: Not Currently    Partners: Female  Other Topics Concern   Not on file  Social History Narrative   Lives with sister [name], brother in law [name]), and his mother - Kostas Marrow   Does not routinely exercise.   Social Drivers of Health   Tobacco Use:  High Risk (10/19/2024)   Patient History    Smoking Tobacco Use: Every Day    Smokeless Tobacco Use: Never    Passive Exposure: Not on file  Financial Resource Strain: High Risk (01/21/2024)   Overall Financial Resource Strain (CARDIA)    Difficulty of Paying Living Expenses: Hard  Food Insecurity: No Food Insecurity (04/04/2024)   Epic    Worried About Programme Researcher, Broadcasting/film/video in the Last Year: Never true    Ran Out of Food in the Last Year: Never true  Transportation Needs: No Transportation Needs (04/04/2024)   Epic    Lack of Transportation (Medical): No    Lack of Transportation (Non-Medical): No  Physical Activity: Insufficiently Active (12/06/2022)   Exercise Vital Sign    Days of Exercise per Week: 3 days    Minutes of Exercise per Session: 10 min  Stress: No Stress Concern Present (12/06/2022)   [employer] of Occupational Health - Occupational Stress Questionnaire    Feeling of Stress : Only a little  Social Connections: Socially Isolated (02/17/2024)    Social Connection and Isolation Panel    Frequency of Communication with Friends and Family: More than three times a week    Frequency of Social Gatherings with Friends and Family: Twice a week    Attends Religious Services: Never    Database Administrator or Organizations: No    Attends Banker Meetings: Never    Marital Status: Divorced  Depression (PHQ2-9): Medium Risk (09/24/2024)   Depression (PHQ2-9)    PHQ-2 Score: 10  Alcohol Screen: Low Risk (12/06/2022)   Alcohol Screen    Last Alcohol Screening Score (AUDIT): 0  Housing: Unknown (04/04/2024)   Epic    Unable to Pay for Housing in the Last Year: No    Number of Times Moved in the Last Year: Not on file    Homeless in the Last Year: No  Utilities: Not At Risk (04/04/2024)   Epic    Threatened with loss of utilities: No  Health Literacy: Not on file     Family History:  The patient's family history includes AAA (abdominal aortic aneurysm) in his maternal grandmother; Diabetes in his maternal grandfather and mother; Heart attack in his maternal grandmother and mother; Heart disease in his mother; Hyperlipidemia in his mother; Hypertension in his father and mother; Lung cancer in his father; Stroke in his father.  ROS:   12-point review of systems is negative unless otherwise noted in the HPI.  EKGs/Other Studies Reviewed:    Studies reviewed were summarized above. The additional studies were reviewed today:  06/08/2024 LHC   Ost LAD lesion is 55% stenosed.   1st Mrg lesion is 60% stenosed.   Prox LAD to Mid LAD lesion is 95% stenosed.   1st Diag lesion is 60% stenosed.   A drug-eluting stent was successfully placed using a STENT ONYX FRONTIER 3.0X15.   Balloon angioplasty was performed using a BALLOON TREK RX 2.5X12.   Post intervention, there is a 0% residual stenosis.   Post intervention, there is a 10% residual stenosis.   In the absence of any other complications or medical issues, we expect the patient  to be ready for discharge from an interventional cardiology perspective on 06/09/2024.   Recommend uninterrupted dual antiplatelet therapy with Aspirin  81mg  daily and Prasugrel  10mg  daily for a minimum of 12 months (ACS-Class I recommendation).   1.  Severe one-vessel coronary artery disease with evidence of hazy stenosis in the  proximal LAD at the bifurcation of a large diagonal branch likely due to plaque rupture.  Stable moderate ostial LAD and OM1 stenosis. 2.  Left ventricular angiography was not performed.  EF was normal by echo. 3.  Moderately severely elevated left ventricular end-diastolic pressure at 28 to 30 mmHg. 4.  Difficult but successful OCT guided angioplasty and drug-eluting stent placement to the LAD with balloon angioplasty to the ostial diagonal .  Difficult procedure overall due to bifurcation involvement and significant difficulty in wiring the LAD due to steep angulation after the diagonal origin.    06/08/2024 Echo complete 1. Left ventricular ejection fraction, by estimation, is 55 to 60%. The  left ventricle has normal function. The left ventricle has no regional  wall motion abnormalities. There is moderate left ventricular hypertrophy.  Left ventricular diastolic  parameters are consistent with Grade II diastolic dysfunction  (pseudonormalization).   2. Right ventricular systolic function is normal. The right ventricular  size is normal. Tricuspid regurgitation signal is inadequate for assessing  PA pressure.   3. Left atrial size was mildly dilated.   4. The mitral valve is normal in structure. Mild mitral valve  regurgitation. No evidence of mitral stenosis.   5. The aortic valve is normal in structure. Aortic valve regurgitation is  not visualized. Aortic valve sclerosis/calcification is present, without  any evidence of aortic stenosis.   EKG:  EKG personally reviewed by me today    PHYSICAL EXAM:    VS:  BP (!) 160/100 (BP Location: Left Arm, Patient  Position: Sitting, Cuff Size: Normal)   Pulse 68   Ht 5' 7 (1.702 m)   Wt 226 lb (102.5 kg)   SpO2 98%   BMI 35.40 kg/m   BMI: Body mass index is 35.4 kg/m.  GEN: Well nourished, well developed in no acute distress NECK: No JVD; No carotid bruits CARDIAC: RRR, no murmurs, rubs, gallops RESPIRATORY:  Clear to auscultation without rales, wheezing or rhonchi  ABDOMEN: Soft, non-tender, non-distended EXTREMITIES: No edema; No deformity  Wt Readings from Last 3 Encounters:  10/19/24 226 lb (102.5 kg)  09/24/24 227 lb (103 kg)  08/14/24 223 lb 6.4 oz (101.3 kg)                 Cardiac Rehabilitation Eligibility Assessment  The patient is ready to start cardiac rehabilitation from a cardiac standpoint.    ASSESSMENT & PLAN:   Coronary artery disease - S/p PCI/DES to proximal LAD 05/2024.  No symptoms of angina or cardiac decompensation.  He is continued on DAPT with aspirin  81 mg daily and Effient  10 mg daily for at least 12 months.  Continues on Ranexa , Repatha , Zetia , and rosuvastatin .  Unfortunately has not yet been contacted for cardiac rehab.  Will send another referral.  HFpEF Pulmonary hypertension - Echo 05/2024 with EF 55 to 60% and G2 DD.  He appears euvolemic on exam.  NYHA class II symptoms.  He is continued on valsartan  160 mg daily, bisoprolol  10 mg in the a.m. and 5 mg in the p.m., Jardiance  10 mg daily, spironolactone  25 mg daily, and torsemide  40 mg daily.  Hypertension - BP elevated in the office today.  Was previously well-controlled prior to injuring his neck.  Suspect elevated pressures are due to pain.  He has an upcoming appointment at [facility] for pain management.  Will defer further titration of medications at this time.  Continue current doses of valsartan , amlodipine , bisoprolol , and hydralazine .  BP log provided.  Hyperlipidemia - Most recent lipid panel 07/2024 with LDL 129 despite compliance with statin, Zetia , and Repatha .  He was previously referred to  lipid clinic but has not yet been seen.  Will send another referral.  CKD IIIa - Creatinine stable at 1.19 on 10/31  Obesity - Recommend healthy lifestyle modifications to achieve weight loss  Tobacco use - Continues to smoke 1 pack/day.  Recommend complete cessation.  OSA - Recent sleep study, awaiting CPAP approval.   Disposition: Will again send referrals for cardiac rehab and lipid clinic.  BP log provided.  F/u with Dr. Darliss or an APP in 2 to 3 months.   Medication Adjustments/Labs and Tests Ordered: Current medicines are reviewed at length with the patient today.  Concerns regarding medicines are outlined above. Medication changes, Labs and Tests ordered today are summarized above and listed in the Patient Instructions accessible in Encounters.   Bonney Lesley Maffucci, PA-C 10/19/2024 10:46 AM     [facility] -  [address] [place], [place] [address] [phone]      [1]  Current Meds  Medication Sig   allopurinol  (ZYLOPRIM ) 100 MG tablet Take 1 tablet (100 mg total) by mouth daily with the 300mg  tablet for a total daily dose of 400mg .   allopurinol  (ZYLOPRIM ) 300 MG tablet Take 1 tablet (300 mg total) by mouth daily as needed (Gout flare). Home med.   amLODipine  (NORVASC ) 10 MG tablet Take 1 tablet (10 mg total) by mouth daily.   aspirin  EC 81 MG tablet Take 1 tablet (81 mg total) by mouth daily. Swallow whole.   bisoprolol  (ZEBETA ) 5 MG tablet Take 2 tablets (10 mg total) by mouth every morning AND 1 tablet (5 mg total) at bedtime.   budesonide -formoterol  (SYMBICORT ) 160-4.5 MCG/ACT inhaler Inhale 2 puffs into the lungs 2 (two) times daily. (Patient taking differently: Inhale 2 puffs into the lungs 2 (two) times daily. As needed)   Continuous Glucose Sensor (FREESTYLE LIBRE 3 SENSOR) MISC Place 1 sensor onto skin every 14 (fourteen) days to monitor blood sugar continuously   cromolyn  (OPTICROM ) 4 % ophthalmic solution Place 1  drop into both eyes 4 (four) times daily as needed.   Dulaglutide  (TRULICITY ) 3 MG/0.5ML SOAJ Inject 3 mg into the skin once a week.   DULoxetine  (CYMBALTA ) 30 MG capsule Take 2 capsules (60 mg total) by mouth every morning AND 1 capsule (30 mg total) every evening.   empagliflozin  (JARDIANCE ) 10 MG TABS tablet Take 1 tablet (10 mg total) by mouth daily.   Evolocumab  (REPATHA  SURECLICK) 140 MG/ML SOAJ Inject 140 mg into the skin every 14 (fourteen) days.   ezetimibe  (ZETIA ) 10 MG tablet Take 1 tablet (10 mg total) by mouth daily.   fluticasone  (FLONASE ) 50 MCG/ACT nasal spray Place 1 spray into both nostrils daily.   Glucagon  (BAQSIMI  ONE PACK) 3 MG/DOSE POWD Place 1 Device into the nose as needed (Low blood sugar with impaired consciousness).   hydrALAZINE  (APRESOLINE ) 25 MG tablet Take 1 tablet (25 mg total) by mouth in the morning and at bedtime.   insulin  glargine (LANTUS  SOLOSTAR) 100 UNIT/ML Solostar Pen Inject 20-25 Units into the skin at bedtime.   insulin  lispro (HUMALOG ) 100 UNIT/ML KwikPen Inject 10 Units into the skin 3 (three) times daily. Plus sliding scale up per instructions   ipratropium-albuterol  (DUONEB) 0.5-2.5 (3) MG/3ML SOLN Take 3 mLs by nebulization every 6 (six) hours as needed.   levocetirizine (XYZAL ) 5 MG tablet Take 1  tablet (5 mg total) by mouth every evening.   pantoprazole  (PROTONIX ) 40 MG tablet Take 1 tablet (40 mg total) by mouth daily.   prasugrel  (EFFIENT ) 10 MG TABS tablet Take 1 tablet (10 mg total) by mouth daily.   pregabalin  (LYRICA ) 50 MG capsule Take 1 capsule (50 mg total) by mouth 2 (two) times daily as needed. Home med.   ranolazine  (RANEXA ) 500 MG 12 hr tablet Take 1 tablet (500 mg total) by mouth 2 (two) times daily.   rosuvastatin  (CRESTOR ) 40 MG tablet Take 1 tablet (40 mg total) by mouth daily.   spironolactone  (ALDACTONE ) 25 MG tablet Take 1 tablet (25 mg total) by mouth daily.   sucralfate  (CARAFATE ) 1 g tablet Take 1 tablet (1 g total) by  mouth 4 (four) times daily -  with meals and at bedtime.   Tiotropium Bromide  Monohydrate (SPIRIVA  RESPIMAT) 2.5 MCG/ACT AERS Inhale 2 puffs into the lungs daily.   tiZANidine  (ZANAFLEX ) 4 MG tablet Take 0.5-1.5 tablets (2-6 mg total) by mouth every 8 (eight) hours as needed for muscle spasms (muscle tightness).   torsemide  (DEMADEX ) 20 MG tablet Take 2 tablets (40 mg total) by mouth daily.   triamcinolone  ointment (KENALOG ) 0.1 % Apply 1 Application topically 2 (two) times daily. To affected areas   valsartan  (DIOVAN ) 160 MG tablet Take 1 tablet (160 mg total) by mouth daily.   VENTOLIN  HFA 108 (90 Base) MCG/ACT inhaler Inhale 2 puffs into the lungs every 6 (six) hours as needed for wheezing or shortness of breath.   "

## 2024-10-19 ENCOUNTER — Encounter: Payer: Self-pay | Admitting: Physician Assistant

## 2024-10-19 ENCOUNTER — Other Ambulatory Visit: Payer: Self-pay

## 2024-10-19 ENCOUNTER — Emergency Department
Admission: EM | Admit: 2024-10-19 | Discharge: 2024-10-19 | Disposition: A | Discharge status: Home / Self Care | Attending: Emergency Medicine | Admitting: Emergency Medicine

## 2024-10-19 ENCOUNTER — Ambulatory Visit: Attending: Physician Assistant | Admitting: Physician Assistant

## 2024-10-19 ENCOUNTER — Ambulatory Visit: Attending: Family | Admitting: Family

## 2024-10-19 ENCOUNTER — Encounter: Payer: Self-pay | Admitting: Emergency Medicine

## 2024-10-19 ENCOUNTER — Encounter: Payer: Self-pay | Admitting: Family

## 2024-10-19 VITALS — BP 160/100 | HR 68 | Ht 67.0 in | Wt 226.0 lb

## 2024-10-19 VITALS — BP 204/106 | HR 64 | Wt 223.2 lb

## 2024-10-19 DIAGNOSIS — N183 Chronic kidney disease, stage 3 unspecified: Secondary | ICD-10-CM | POA: Insufficient documentation

## 2024-10-19 DIAGNOSIS — Z794 Long term (current) use of insulin: Secondary | ICD-10-CM | POA: Diagnosis not present

## 2024-10-19 DIAGNOSIS — R7989 Other specified abnormal findings of blood chemistry: Secondary | ICD-10-CM | POA: Insufficient documentation

## 2024-10-19 DIAGNOSIS — G4733 Obstructive sleep apnea (adult) (pediatric): Secondary | ICD-10-CM | POA: Diagnosis not present

## 2024-10-19 DIAGNOSIS — I5032 Chronic diastolic (congestive) heart failure: Secondary | ICD-10-CM | POA: Insufficient documentation

## 2024-10-19 DIAGNOSIS — E785 Hyperlipidemia, unspecified: Secondary | ICD-10-CM | POA: Insufficient documentation

## 2024-10-19 DIAGNOSIS — Z955 Presence of coronary angioplasty implant and graft: Secondary | ICD-10-CM | POA: Diagnosis not present

## 2024-10-19 DIAGNOSIS — F17200 Nicotine dependence, unspecified, uncomplicated: Secondary | ICD-10-CM | POA: Diagnosis not present

## 2024-10-19 DIAGNOSIS — Z79899 Other long term (current) drug therapy: Secondary | ICD-10-CM | POA: Insufficient documentation

## 2024-10-19 DIAGNOSIS — I2510 Atherosclerotic heart disease of native coronary artery without angina pectoris: Secondary | ICD-10-CM | POA: Insufficient documentation

## 2024-10-19 DIAGNOSIS — M109 Gout, unspecified: Secondary | ICD-10-CM | POA: Insufficient documentation

## 2024-10-19 DIAGNOSIS — M542 Cervicalgia: Secondary | ICD-10-CM | POA: Diagnosis not present

## 2024-10-19 DIAGNOSIS — E66812 Obesity, class 2: Secondary | ICD-10-CM | POA: Insufficient documentation

## 2024-10-19 DIAGNOSIS — Z6835 Body mass index (BMI) 35.0-35.9, adult: Secondary | ICD-10-CM | POA: Diagnosis not present

## 2024-10-19 DIAGNOSIS — N189 Chronic kidney disease, unspecified: Secondary | ICD-10-CM | POA: Insufficient documentation

## 2024-10-19 DIAGNOSIS — Z7984 Long term (current) use of oral hypoglycemic drugs: Secondary | ICD-10-CM | POA: Diagnosis not present

## 2024-10-19 DIAGNOSIS — E119 Type 2 diabetes mellitus without complications: Secondary | ICD-10-CM

## 2024-10-19 DIAGNOSIS — R0789 Other chest pain: Secondary | ICD-10-CM | POA: Insufficient documentation

## 2024-10-19 DIAGNOSIS — N1831 Chronic kidney disease, stage 3a: Secondary | ICD-10-CM | POA: Insufficient documentation

## 2024-10-19 DIAGNOSIS — I10 Essential (primary) hypertension: Secondary | ICD-10-CM | POA: Diagnosis not present

## 2024-10-19 DIAGNOSIS — I130 Hypertensive heart and chronic kidney disease with heart failure and stage 1 through stage 4 chronic kidney disease, or unspecified chronic kidney disease: Secondary | ICD-10-CM | POA: Insufficient documentation

## 2024-10-19 DIAGNOSIS — I428 Other cardiomyopathies: Secondary | ICD-10-CM | POA: Diagnosis not present

## 2024-10-19 DIAGNOSIS — I272 Pulmonary hypertension, unspecified: Secondary | ICD-10-CM | POA: Insufficient documentation

## 2024-10-19 DIAGNOSIS — E1165 Type 2 diabetes mellitus with hyperglycemia: Secondary | ICD-10-CM | POA: Insufficient documentation

## 2024-10-19 DIAGNOSIS — Z7985 Long-term (current) use of injectable non-insulin antidiabetic drugs: Secondary | ICD-10-CM | POA: Insufficient documentation

## 2024-10-19 DIAGNOSIS — J4489 Other specified chronic obstructive pulmonary disease: Secondary | ICD-10-CM | POA: Insufficient documentation

## 2024-10-19 DIAGNOSIS — I509 Heart failure, unspecified: Secondary | ICD-10-CM | POA: Insufficient documentation

## 2024-10-19 DIAGNOSIS — E1122 Type 2 diabetes mellitus with diabetic chronic kidney disease: Secondary | ICD-10-CM | POA: Insufficient documentation

## 2024-10-19 DIAGNOSIS — E782 Mixed hyperlipidemia: Secondary | ICD-10-CM | POA: Diagnosis not present

## 2024-10-19 DIAGNOSIS — I25118 Atherosclerotic heart disease of native coronary artery with other forms of angina pectoris: Secondary | ICD-10-CM | POA: Diagnosis not present

## 2024-10-19 DIAGNOSIS — J449 Chronic obstructive pulmonary disease, unspecified: Secondary | ICD-10-CM | POA: Diagnosis not present

## 2024-10-19 DIAGNOSIS — I160 Hypertensive urgency: Secondary | ICD-10-CM | POA: Diagnosis not present

## 2024-10-19 DIAGNOSIS — F17210 Nicotine dependence, cigarettes, uncomplicated: Secondary | ICD-10-CM | POA: Diagnosis not present

## 2024-10-19 LAB — CBC WITH DIFFERENTIAL/PLATELET
Abs Immature Granulocytes: 0.03 K/uL (ref 0.00–0.07)
Basophils Absolute: 0 K/uL (ref 0.0–0.1)
Basophils Relative: 1 %
Eosinophils Absolute: 0.1 K/uL (ref 0.0–0.5)
Eosinophils Relative: 2 %
HCT: 46.9 % (ref 39.0–52.0)
Hemoglobin: 15.7 g/dL (ref 13.0–17.0)
Immature Granulocytes: 0 %
Lymphocytes Relative: 23 %
Lymphs Abs: 1.9 K/uL (ref 0.7–4.0)
MCH: 29.1 pg (ref 26.0–34.0)
MCHC: 33.5 g/dL (ref 30.0–36.0)
MCV: 86.9 fL (ref 80.0–100.0)
Monocytes Absolute: 0.4 K/uL (ref 0.1–1.0)
Monocytes Relative: 4 %
Neutro Abs: 5.7 K/uL (ref 1.7–7.7)
Neutrophils Relative %: 70 %
Platelets: 175 K/uL (ref 150–400)
RBC: 5.4 MIL/uL (ref 4.22–5.81)
RDW: 13.2 % (ref 11.5–15.5)
WBC: 8.2 K/uL (ref 4.0–10.5)
nRBC: 0 % (ref 0.0–0.2)

## 2024-10-19 LAB — COMPREHENSIVE METABOLIC PANEL WITH GFR
ALT: 22 U/L (ref 0–44)
AST: 22 U/L (ref 15–41)
Albumin: 4 g/dL (ref 3.5–5.0)
Alkaline Phosphatase: 78 U/L (ref 38–126)
Anion gap: 14 (ref 5–15)
BUN: 15 mg/dL (ref 6–20)
CO2: 25 mmol/L (ref 22–32)
Calcium: 9 mg/dL (ref 8.9–10.3)
Chloride: 101 mmol/L (ref 98–111)
Creatinine, Ser: 1.37 mg/dL — ABNORMAL HIGH (ref 0.61–1.24)
GFR, Estimated: >60 mL/min
Glucose, Bld: 290 mg/dL — ABNORMAL HIGH (ref 70–99)
Potassium: 3.7 mmol/L (ref 3.5–5.1)
Sodium: 139 mmol/L (ref 135–145)
Total Bilirubin: 0.6 mg/dL (ref 0.0–1.2)
Total Protein: 6.6 g/dL (ref 6.5–8.1)

## 2024-10-19 MED ORDER — CLONIDINE HCL 0.1 MG PO TABS
0.1000 mg | ORAL_TABLET | Freq: Once | ORAL | Status: AC
  Administered 2024-10-19: 0.1 mg via ORAL
  Filled 2024-10-19: qty 1

## 2024-10-19 MED ORDER — VALSARTAN 160 MG PO TABS
160.0000 mg | ORAL_TABLET | Freq: Every day | ORAL | 5 refills | Status: DC
  Filled 2024-10-19 (×2): qty 30, 30d supply, fill #0

## 2024-10-19 MED FILL — Continuous Glucose System Sensor: 28 days supply | Qty: 2 | Fill #1 | Status: AC

## 2024-10-19 NOTE — Patient Instructions (Signed)
 Medication Instructions:  Your physician recommends that you continue on your current medications as directed. Please refer to the Current Medication list given to you today.  *If you need a refill on your cardiac medications before your next appointment, please call your pharmacy*  Lab Work: No labs ordered today  If you have labs (blood work) drawn today and your tests are completely normal, you will receive your results only by: MyChart Message (if you have MyChart) OR A paper copy in the mail If you have any lab test that is abnormal or we need to change your treatment, we will call you to review the results.  Testing/Procedures: No test ordered today   Follow-Up: At [facility], you and your health needs are our priority.  As part of our continuing mission to provide you with exceptional heart care, our providers are all part of one team.  This team includes your primary Cardiologist (physician) and Advanced Practice Providers or APPs (Physician Assistants and Nurse Practitioners) who all work together to provide you with the care you need, when you need it.  Your next appointment:   2-3 month(s)  Provider:   You may see Redell Cave, MD or one of the following Advanced Practice Providers on your designated Care Team:   Lonni Meager, NP Lesley Maffucci, PA-C Bernardino Bring, PA-C Cadence Orrville, PA-C Tylene Lunch, NP Barnie Hila, NP

## 2024-10-19 NOTE — Discharge Instructions (Addendum)
 Restart your valsartan  as prescribed.  Follow-up with your primary provider or the heart failure clinic as scheduled.

## 2024-10-19 NOTE — Patient Instructions (Signed)
 Medication Changes:  RESTART Valsartan  160mg  daily  Lab Work:  Go downstairs to [facility] on [address] to have your blood work completed.  We will only call you if the results are abnormal or if the provider would like to make medication changes.  No news is good news.   Follow-Up in: Please follow up with the Advanced Heart Failure Clinic in 1 month with [name], FNP.   Thank you for choosing [facility].    At the Advanced Heart Failure Clinic, you and your health needs are our priority. We have a designated team specialized in the treatment of Heart Failure. This Care Team includes your primary Heart Failure Specialized Cardiologist (physician), Advanced Practice Providers (APPs- Physician Assistants and Nurse Practitioners), and Pharmacist who all work together to provide you with the care you need, when you need it.   You may see any of the following providers on your designated Care Team at your next follow up:  Dr. Toribio Fuel Dr. Ezra Shuck Dr. Ria Commander Dr. Morene Brownie [name], FNP Jaun Bash, RPH-CPP  Please be sure to bring in all your medications bottles to every appointment.   Need to Contact Us :  If you have any questions or concerns before your next appointment please send us  a message through [facility] or call our office at [phone].    TO LEAVE A MESSAGE FOR THE NURSE SELECT OPTION 2, PLEASE LEAVE A MESSAGE INCLUDING: YOUR NAME DATE OF BIRTH CALL BACK NUMBER REASON FOR CALL**this is important as we prioritize the call backs  YOU WILL RECEIVE A CALL BACK THE SAME DAY AS LONG AS YOU CALL BEFORE 4:00 PM

## 2024-10-19 NOTE — ED Provider Notes (Signed)
 "   [facility] Emergency Department Provider Note     Event Date/Time   First MD Initiated Contact with Patient 10/19/24 1939     (approximate)   History   Hypertension   HPI  Cory Rivas is a 59 y.o. male with a history of CHF, HTN, HLD, DM type II, CKD, COPD, and obesity, presents to the ED for evaluation of elevated blood pressure readings.  Patient was in the heart failure clinic, when he apparently had a recorded BP of 204/106.  The heart failure clinic advised him to report to the ED for evaluation management of his acute blood pressure elevation.  Patient with endorses been out of his blood pressure medicines for at least the last month.  He was unaware that they would not in his last monthly shipment.  The heart failure clinic has refilled his appropriate medications at this time.  Patient presents to the ED in no acute distress but no chest pain, shortness of breath, cough, or diaphoresis reported.  No reports of any headache, weakness, syncope, slurred speech, or paralysis.     Physical Exam   Triage Vital Signs: ED Triage Vitals  Encounter Vitals Group     BP 10/19/24 1658 (!) 178/99     Girls Systolic BP Percentile --      Girls Diastolic BP Percentile --      Boys Systolic BP Percentile --      Boys Diastolic BP Percentile --      Pulse Rate 10/19/24 1658 76     Resp 10/19/24 1658 17     Temp 10/19/24 1658 98.1 F (36.7 C)     Temp Source 10/19/24 1658 Oral     SpO2 10/19/24 1658 96 %     Weight 10/19/24 1714 223 lb (101.2 kg)     Height 10/19/24 1714 5' 6 (1.676 m)     Head Circumference --      Peak Flow --      Pain Score 10/19/24 1714 0     Pain Loc --      Pain Education --      Exclude from Growth Chart --     Most recent vital signs: Vitals:   10/19/24 2200 10/19/24 2231  BP: (!) 167/90 (!) 167/92  Pulse:    Resp:    Temp:    SpO2:      General Awake, no distress.  NAD HEENT NCAT. PERRL. EOMI. No rhinorrhea.  Mucous membranes are moist.  CV:  Good peripheral perfusion. RRR RESP:  Normal effort. CTA ABD:  No distention.    ED Results / Procedures / Treatments   Labs (all labs ordered are listed, but only abnormal results are displayed) Labs Reviewed  CBC WITH DIFFERENTIAL/PLATELET  COMPREHENSIVE METABOLIC PANEL WITH GFR     EKG   RADIOLOGY  No results found.   PROCEDURES:  Critical Care performed: No  Procedures   MEDICATIONS ORDERED IN ED: Medications  cloNIDine  (CATAPRES ) tablet 0.1 mg (0.1 mg Oral Given 10/19/24 2049)     IMPRESSION / MDM / ASSESSMENT AND PLAN / ED COURSE  I reviewed the triage vital signs and the nursing notes.                              Differential diagnosis includes, but is not limited to, hypertensive urgency, hypertensive emergency, medication non-compliance  Patient's presentation is most consistent with acute complicated  illness / injury requiring diagnostic workup.  Patient's diagnosis is consistent with hypertensive urgency.  He presents to the ED from the heart failure clinic for evaluation management of his elevated blood pressure.  Patient is in no acute respite distress, and denies any chest pain.  Blood pressure downtrending after a dose of clonidine  in the ED.  Patient will be discharged home with instructions to take his recently re-ordered medicines. Patient is to follow up with PCP or heart failure specialist as needed or otherwise directed. Patient is given ED precautions to return to the ED for any worsening or new symptoms.     FINAL CLINICAL IMPRESSION(S) / ED DIAGNOSES   Final diagnoses:  Hypertensive urgency     Rx / DC Orders   ED Discharge Orders     None        Note:  This document was prepared using Dragon voice recognition software and may include unintentional dictation errors.    Loyd Candida LULLA Aldona, PA-C 10/19/24 2248    Willo Dunnings, MD 10/19/24 2336  "

## 2024-10-19 NOTE — ED Triage Notes (Signed)
 Patient in via POV, sent over from Heart Failure Clinic, reports BP 204/106; advised to be evaluated here in efforts to reduce BP.  Patient reports he has been out of BP meds x 1 month; states he was unaware why they did not arrive in his shipment this month.  States Heart Failure Clinic has refilled appropriate medications.    Ambulatory to triage, NAD noted at this time.

## 2024-10-20 ENCOUNTER — Ambulatory Visit: Payer: Self-pay | Admitting: Family

## 2024-10-20 LAB — BASIC METABOLIC PANEL WITH GFR
BUN/Creatinine Ratio: 12 (ref 9–20)
BUN: 16 mg/dL (ref 6–24)
CO2: 22 mmol/L (ref 20–29)
Calcium: 9.1 mg/dL (ref 8.7–10.2)
Chloride: 102 mmol/L (ref 96–106)
Creatinine, Ser: 1.38 mg/dL — ABNORMAL HIGH (ref 0.76–1.27)
Glucose: 179 mg/dL — ABNORMAL HIGH (ref 70–99)
Potassium: 4.1 mmol/L (ref 3.5–5.2)
Sodium: 140 mmol/L (ref 134–144)
eGFR: 60 mL/min/1.73

## 2024-10-23 ENCOUNTER — Telehealth: Payer: Self-pay | Admitting: Family

## 2024-10-23 NOTE — Telephone Encounter (Signed)
 Called to confirm/remind patient of their appointment at the [facility] on 10/26/24.   Appointment:   [x] Confirmed  [] Left mess   [] No answer/No voice mail  [] VM Full/unable to leave message  [] Phone not in service  Patient reminded to bring all medications and/or complete list.  Confirmed patient has transportation. Gave directions, instructed to utilize valet parking.

## 2024-10-24 NOTE — Progress Notes (Unsigned)
 "  [facility] Note    PCP: Cory Mole, PA-C Primary Cardiologist: Cory Cave, MD/ Cory Rivas, [place]   Chief Complaint: fatigue   HPI:  [name] is a 59 y/o male with a history of DM, hyperlipidemia, tracheobronchomalacia, nonobstructive CAD, HTN, gout, COPD, asthma, CKD, dyslipidemia, tobacco use and chronic heart failure.   Echo 11/04/20: EF 60-65% with Grade I DD, mild LVH/ LAE and normal PA pressure of 31.6 mmHg  Admitted 08/23/22 due to dyspnea, productive cough of brown sputum, mild sore throat, fatigue, dizziness and chest soreness. Steroid burst given. Initially given IV lasix  with transition to oral diuretics. Echo 08/23/22: EF of 60-65% along with mild LVH & mild MR. Lisinopril  held due to AKI. Discharged after 2 days. Admitted 11/22/22 due to acute on chronic HF. Weaned off of bipap.Was in the ED 03/01/23 due to neck pain after falling off his tractor after he hit a stump. He landed on his right side.Head CT/ cervical spine xrays were negative. Was in the ED 03/09/23 due to atypical chest pain. Chest CTA negative for PE. Was in the ED 03/22/23 due to nonspecific chest pain. Workup negative. Was in the ED 03/23/23 due to hyperglycemia with glucose of >400.    Admitted 05/27/23 due to acute onset of chest pain, shortness of breath and diaphoresis. Blood pressure was significantly elevated to 204/102 on admission. Troponins were mildly elevated up to 26. Cardiology was consulted and he underwent cardiac catheterization 8/14 which revealed non-obstructive coronary artery disease. Meds adjusted. Chest x-ray without acute cardiopulmonary process. Echo on 05/28/2023 showed an EF of 50 to 55%, no regional wall motion abnormalities, moderate LVH, grade 3 diastolic dysfunction, elevated left atrial pressure, normal RV systolic function and ventricular cavity size, mildly dilated left atrium, mild mitral regurgitation, aortic valve sclerosis without evidence of stenosis, and an estimated  right atrial pressure of 3 mmHg.   Admitted 08/19/23 with chest pain/ pressure for 2 days located in the front chest. Worse with deep breath. Has shortness of breath and cough along with this. Negative D-dimer. Troponin 23 => 22. 1 dose of IV Lasix  as BNP 143.9. Glucose 409. Was in the ED 08/31/23 with central chest pressure along with shortness of breath & cough. Labs reassuring with normal troponin. Negative D-dimer. CXR concerning for pneumonia so antibiotics provided.   Admitted 10/21/23 due to acute hypoxic respiratory failure secondary to COPD exacerbation and acute on chronic HFpEF. Found to have worsening CKD. High-sensitivity troponin 21 with a delta troponin of 20 subsequently trended to 19. BNP 331. D-dimer negative. Supplemental oxygen given. Initially given IV lasix  with transition to increased home torsemide . Lisinopril , spironolactone , and torsemide  were held due to AKI.    R/LHC 10/30/23:  Moderate LAD and OM1 disease, not significantly different compared to prior catheterization in 05/2023.  RFR of sequential ostial and proximal LAD stenosis at that time was not hemodynamically significant. Normal left and right heart filling pressures. Mild pulmonary hypertension. Normal Fick cardiac output/index.        Echo 11/12/23: EF 55-60%, normal RV, mild MR  Admitted 01/20/24 with worsening cough, wheezing & shortness of breath. Gained 10 pounds in the last month.IV diuresed. Chest x-ray showed pulmonary congestion as well as chronic interstitial changes compatible with COPD. Meds changed.   Admitted 02/17/24 with cough, shortness of breath, orthopnea, exertional dyspnea, chest tightness, and leg swelling. Patient has gained 7-8 pounds since previous discharge dry weight. On admission, BNP was 300.0 and HS-troponin was 27. Chest  x-ray noted mild cardiomegaly and interstitial edema. Placed on bipap, given IV lasix . Torsemide , lisinopril  and bisoprolol  all held  Seen in [name] 05/25 and had lisinopril   switched to valsartan  40mg  daily and torsemide  40mg  daily resumed.   Seen in ED 04/02/24 due to chest tightness and SOB which resolved by the time of ED presentation. Labs unremarkable. Bisoprolol  increased to 10mg .   Admitted 04/03/24 with SOB which was worse w/ ambulation or sitting up. No improvement with nebulizer treatment. Pulmonary edema noted on CXR. Troponins mildly flat and stable. IV diuresed with improvement of symptoms. Remains off CPAP.   Was in the ED 04/12/24 due to SOB with associated chest pressure. Placed on CPAP by EMS and given ASA with resolution of chest pain. Since patient was feeling better, he declined admission.   Was in the 05/17/24 due to chest pain. Prior to admission, took baby aspirin  but symptoms return prompting ED visit. CXR showed no cardiopulmonary disease. Started on ranexa  and discharged.   Was in the ED 06/04/24 due to GI complication.   Was in the ED 06/08/24 due to acute chest pain. EKG initally showed NSR with T wave inversion inferiorly and laterally. Troponin increased during visit from 19 to 47, concerning for NSTEMI. CXR no cardiopulomary disease. CT Chest negative PE. Patient given IV fentanyl  and sublingual nitroglycerin  which relieved chest pain. Echo 06/08/24 LV 55-60%. LV and RV normal function. L/R [facility] 06/08/24 with LAD stent placement. Antiplatelet therapy started.   Was in the ED 07/07/24 due to GI complication.   Seen by [facility] cardiology 10/25 where valsartan  was increased to 160mg  daily and cardiac rehab referral placed.   Seen in [facility] 10/19/24 with extremely elevated BP after being out of valsartan  for ~ 1 month. Valsartan  160mg  daily restarted. Instructed to go to the ER but he declined going from clinic but said he would go later if BP remained elevated.   Was in the ED 10/19/24 due to elevated BP after being out of HTN meds. Given dose of clonidine  with downtrending of BP so meds resumed and he was released.   He presents today for an ER follow-up  visit with a chief complaint of moderate fatigue. Has associated minimal shortness of breath, intermittent left leg cramp and severe neck / back pain. Says that on a pain scale of 0-10, his neck pain is a 12. He has upcoming appt at [facility] on 10/30/23 to discuss this further. Denies chest pain, palpitations, abdominal distention, pedal edema. Still hasn't heard anything from pulmonology regarding OSA treatment. Currently his home BP cuff isn't working. Just took his medications ~ 20  minutes prior to his appointment today. Did not bring his medications with him.   NAS. Drinks more than 4 water bottles a day. Smoking 1 1/2ppd, previous 2ppd, using nicotene lozenge which he states are helping. Denies alcohol use.   ROS: All systems negative except as listed in HPI, PMH and Problem List.  SH:  Social History   Socioeconomic History   Marital status: Divorced    Spouse name: Not on file   Number of children: 0   Years of education: 9   Highest education level: 9th grade  Occupational History   Occupation: disability  Tobacco Use   Smoking status: Every Day    Current packs/day: 0.50    Average packs/day: 1 pack/day for 38.6 years (37.8 ttl pk-yrs)    Types: Cigarettes    Start date: 10/19/1989    Last attempt to quit: 08/20/2022  Smokeless tobacco: Never   Tobacco comments:    30+ years hx smoking 2 ppd, stopped smoking briefly in April 2022 for a surgery and then restarted smoking.  As of August 2025, he smoking about 2 to 4 cigarettes a day.  Vaping Use   Vaping status: Never Used  Substance and Sexual Activity   Alcohol use: No   Drug use: No   Sexual activity: Not Currently    Partners: Female  Other Topics Concern   Not on file  Social History Narrative   Lives with sister [name], brother in law [name]), and his mother - Abhishek Levesque   Does not routinely exercise.   Social Drivers of Health   Tobacco Use: High Risk (10/19/2024)   Patient History    Smoking Tobacco Use: Every Day     Smokeless Tobacco Use: Never    Passive Exposure: Not on file  Financial Resource Strain: High Risk (01/21/2024)   Overall Financial Resource Strain (CARDIA)    Difficulty of Paying Living Expenses: Hard  Food Insecurity: No Food Insecurity (04/04/2024)   Epic    Worried About Programme Researcher, Broadcasting/film/video in the Last Year: Never true    Ran Out of Food in the Last Year: Never true  Transportation Needs: No Transportation Needs (04/04/2024)   Epic    Lack of Transportation (Medical): No    Lack of Transportation (Non-Medical): No  Physical Activity: Insufficiently Active (12/06/2022)   Exercise Vital Sign    Days of Exercise per Week: 3 days    Minutes of Exercise per Session: 10 min  Stress: No Stress Concern Present (12/06/2022)   [employer] of Occupational Health - Occupational Stress Questionnaire    Feeling of Stress : Only a little  Social Connections: Socially Isolated (02/17/2024)   Social Connection and Isolation Panel    Frequency of Communication with Friends and Family: More than three times a week    Frequency of Social Gatherings with Friends and Family: Twice a week    Attends Religious Services: Never    Database Administrator or Organizations: No    Attends Banker Meetings: Never    Marital Status: Divorced  Catering Manager Violence: Not At Risk (04/04/2024)   Epic    Fear of Current or Ex-Partner: No    Emotionally Abused: No    Physically Abused: No    Sexually Abused: No  Depression (PHQ2-9): Medium Risk (09/24/2024)   Depression (PHQ2-9)    PHQ-2 Score: 10  Alcohol Screen: Low Risk (12/06/2022)   Alcohol Screen    Last Alcohol Screening Score (AUDIT): 0  Housing: Unknown (04/04/2024)   Epic    Unable to Pay for Housing in the Last Year: No    Number of Times Moved in the Last Year: Not on file    Homeless in the Last Year: No  Utilities: Not At Risk (04/04/2024)   Epic    Threatened with loss of utilities: No  Health Literacy: Not on file    FH:   Family History  Problem Relation Age of Onset   Heart disease Mother    Diabetes Mother    Hyperlipidemia Mother    Hypertension Mother    Heart attack Mother    Lung cancer Father    Hypertension Father    Stroke Father    AAA (abdominal aortic aneurysm) Maternal Grandmother    Heart attack Maternal Grandmother    Diabetes Maternal Grandfather     Past Medical History:  Diagnosis Date   Bulging of cervical intervertebral disc    CAD (coronary artery disease)    a. 09/2020 MV: low risk; b. 05/2023 Cath: Moderate LAD dzs w/ seq 50-60% stenoses and nl RFR 0.93. Mod OM dzs->Med rx; b. 10/2023 Cath: LM min irregs, LAD 55ost, 55p/m, LCX large nl, OM1 50, RCA mod, nl.   Cervical myelopathy (HCC)    Chronic heart failure with preserved ejection fraction (HFpEF) (HCC)    a. 05/2023 Echo: EF 50-55%, no rwma, GrIII DD, nl RV fxn, mild MR, Ao sclerosis; b. 10/2023 Echo: EF 55-60%, no rwma, nl RV fxn, mild MR, Ao sclerosis.   CKD (chronic kidney disease), stage III (HCC)    COPD (chronic obstructive pulmonary disease) (HCC)    Diabetes mellitus without complication (HCC)    Gout    Hyperlipidemia LDL goal <70    Hypertension    Morbid obesity (HCC)    OSA (obstructive sleep apnea)    Stress due to family tension 04/15/2019   Tracheobronchomalacia     Current Outpatient Medications  Medication Sig Dispense Refill   allopurinol  (ZYLOPRIM ) 100 MG tablet Take 1 tablet (100 mg total) by mouth daily with the 300mg  tablet for a total daily dose of 400mg . 90 tablet 1   allopurinol  (ZYLOPRIM ) 300 MG tablet Take 1 tablet (300 mg total) by mouth daily as needed (Gout flare). Home med.     amLODipine  (NORVASC ) 10 MG tablet Take 1 tablet (10 mg total) by mouth daily. 90 tablet 3   aspirin  EC 81 MG tablet Take 1 tablet (81 mg total) by mouth daily. Swallow whole. 90 tablet 3   bisoprolol  (ZEBETA ) 5 MG tablet Take 2 tablets (10 mg total) by mouth every morning AND 1 tablet (5 mg total) at bedtime. 270  tablet 3   budesonide -formoterol  (SYMBICORT ) 160-4.5 MCG/ACT inhaler Inhale 2 puffs into the lungs 2 (two) times daily. (Patient taking differently: Inhale 2 puffs into the lungs 2 (two) times daily. As needed) 10.2 g 12   Continuous Glucose Sensor (FREESTYLE LIBRE 3 SENSOR) MISC Place 1 sensor onto skin every 14 (fourteen) days to monitor blood sugar continuously 6 each 2   cromolyn  (OPTICROM ) 4 % ophthalmic solution Place 1 drop into both eyes 4 (four) times daily as needed. 10 mL 2   Dulaglutide  (TRULICITY ) 3 MG/0.5ML SOAJ Inject 3 mg into the skin once a week. 2 mL 10   DULoxetine  (CYMBALTA ) 30 MG capsule Take 2 capsules (60 mg total) by mouth every morning AND 1 capsule (30 mg total) every evening. 270 capsule 1   empagliflozin  (JARDIANCE ) 10 MG TABS tablet Take 1 tablet (10 mg total) by mouth daily. 30 tablet 5   Evolocumab  (REPATHA  SURECLICK) 140 MG/ML SOAJ Inject 140 mg into the skin every 14 (fourteen) days. 2 mL 2   ezetimibe  (ZETIA ) 10 MG tablet Take 1 tablet (10 mg total) by mouth daily. 90 tablet 1   fluticasone  (FLONASE ) 50 MCG/ACT nasal spray Place 1 spray into both nostrils daily. 16 g 6   Glucagon  (BAQSIMI  ONE PACK) 3 MG/DOSE POWD Place 1 Device into the nose as needed (Low blood sugar with impaired consciousness). 2 each 3   hydrALAZINE  (APRESOLINE ) 25 MG tablet Take 1 tablet (25 mg total) by mouth in the morning and at bedtime. 60 tablet 5   insulin  glargine (LANTUS  SOLOSTAR) 100 UNIT/ML Solostar Pen Inject 20-25 Units into the skin at bedtime. 30 mL 1   insulin  lispro (HUMALOG ) 100 UNIT/ML KwikPen Inject  10 Units into the skin 3 (three) times daily. Plus sliding scale up per instructions 27 mL 1   ipratropium-albuterol  (DUONEB) 0.5-2.5 (3) MG/3ML SOLN Take 3 mLs by nebulization every 6 (six) hours as needed. 180 mL 1   levocetirizine (XYZAL ) 5 MG tablet Take 1 tablet (5 mg total) by mouth every evening. 90 tablet 1   pantoprazole  (PROTONIX ) 40 MG tablet Take 1 tablet (40 mg total)  by mouth daily. 90 tablet 1   prasugrel  (EFFIENT ) 10 MG TABS tablet Take 1 tablet (10 mg total) by mouth daily. 90 tablet 3   pregabalin  (LYRICA ) 50 MG capsule Take 1 capsule (50 mg total) by mouth 2 (two) times daily as needed. Home med.     ranolazine  (RANEXA ) 500 MG 12 hr tablet Take 1 tablet (500 mg total) by mouth 2 (two) times daily. 60 tablet 3   rosuvastatin  (CRESTOR ) 40 MG tablet Take 1 tablet (40 mg total) by mouth daily. 90 tablet 1   spironolactone  (ALDACTONE ) 25 MG tablet Take 1 tablet (25 mg total) by mouth daily. 90 tablet 3   sucralfate  (CARAFATE ) 1 g tablet Take 1 tablet (1 g total) by mouth 4 (four) times daily -  with meals and at bedtime. 120 tablet 0   Tiotropium Bromide  Monohydrate (SPIRIVA  RESPIMAT) 2.5 MCG/ACT AERS Inhale 2 puffs into the lungs daily. 4 g 11   tiZANidine  (ZANAFLEX ) 4 MG tablet Take 0.5-1.5 tablets (2-6 mg total) by mouth every 8 (eight) hours as needed for muscle spasms (muscle tightness). 90 tablet 2   torsemide  (DEMADEX ) 20 MG tablet Take 2 tablets (40 mg total) by mouth daily. 60 tablet 6   triamcinolone  ointment (KENALOG ) 0.1 % Apply 1 Application topically 2 (two) times daily. To affected areas 60 g 0   valsartan  (DIOVAN ) 160 MG tablet Take 1 tablet (160 mg total) by mouth daily. 30 tablet 5   VENTOLIN  HFA 108 (90 Base) MCG/ACT inhaler Inhale 2 puffs into the lungs every 6 (six) hours as needed for wheezing or shortness of breath. 18 g 2   No current facility-administered medications for this visit.   Vitals:   10/26/24 0912 10/26/24 1019  BP: (!) 181/90 (!) 151/90  Pulse: 62   SpO2: 98%   Weight: 225 lb 6 oz (102.2 kg)    Wt Readings from Last 3 Encounters:  10/26/24 225 lb 6 oz (102.2 kg)  10/19/24 223 lb (101.2 kg)  10/19/24 223 lb 3.2 oz (101.2 kg)   Lab Results  Component Value Date   CREATININE 1.37 (H) 10/19/2024   CREATININE 1.38 (H) 10/19/2024   CREATININE 1.19 08/14/2024    PHYSICAL EXAM:  General: Appears in pain. Very  stiff neck with decreased ROM  Cor: No JVD. Regular rhythm, rate.  Lungs: clear Abdomen: soft, nontender, nondistended. Extremities: no edema Neuro:. Affect pleasant   ECG 08/14/24: NSR   ASSESSMENT & PLAN:  1: NICM with preserved ejection fraction- - suscpect due to HTN and OSA - NYHA class II - euvolemic - weight up 2 pounds from last visit here 1 week ago - encourgaed to maintain fluid restriction between 60-64oz daily - Echo 11/04/20: EF 60-65% with Grade I DD, mild LVH/ LAE and normal PA pressure of 31.6 mmHg - Echo 08/23/22: EF of 60-65% along with mild LVH & mild MR. - Echo 05/28/23: EF 50-55% with moderate LVH, Grade III DD and mild MR - Echo 11/12/23: EF 55-60%, normal RV, mild MR - Echo 06/08/24 LV 55-60% - continue  bisoprolol  10mg  am/ 5mg  PM - continue jardiance  10mg  every day - continue spironolactone  25mg  daily - continue torsemide  40mg  daily  - Increase valsartan  to 320mg  daily for BP control. BMET today & repeat at next visit - BNP 05/17/24 was 32.4  - Reviewed bringing his medication packs as well as any medication that's not in his packs to every visit.  - Discussed risk reduction and regimen adherence to limit hospital admissions   2: HTN- - BP 181/90, rechecked at end of visit was 151/90. Increasing valsartan  per above. - continue valsartan , amlodipine , bisoprolol , hydralazine , spironolactone  - saw PCP 12/25 - BMET 10/19/24 reviewed: sodium 139, potassium 3.7, creatinine 1.37 & GFR >60 - BMET today  3: DM- - A1c 09/24/24 was 8.7% - saw endocrinology [place]) 10/25 - 05/29/24 microalbumin/ creat ratio was 1381  4:OSA- - sleep study done 10/06/24. + mild OSA with AHI of 5.9 - waiting on CPAP equipment - smoking daily - 1 1/2 ppd, cessation encouraged  - saw pulmonology ([place]) 09/25. Emphasized that he stop in pulmonology office to get f/u scheduled.  - His untreated OSA could be affecting his BP  5: Hyperlipidemia- - continue ezetimibe  10mg  daily -  continue repatha  every 2 weeks - continue rosuvastatin  40mg  daily - lipid panel 08/14/24 showed LDL 129, triglycerides 325. Reviewed limiting fried/ fatty foods. Exercise is difficult due to current severe neck pain that he's having - lipid clinic referral placed 01/26  6: CAD- - LDL 06/08/24 was 101 - saw cardiology 01/26  - continue ezetimibe  10mg  daily - continue rosuvastatin  40mg  daily - [facility] 10/30/23:    Moderate LAD and OM1 disease, not significantly different compared to prior catheterization in 05/2023.  RFR of sequential ostial and proximal LAD stenosis at that time was not hemodynamically significant.   Normal left and right heart filling pressures.   Mild pulmonary hypertension.   Normal Fick cardiac output/index. - cardiac rehab referral placed 01/26   Return in 1 month, sooner if needed.   I spent 30 minutes reviewing records, interviewing/ examing patient and managing plan/ orders.    Ellouise DELENA Class, FNP 10/26/2024  "

## 2024-10-26 ENCOUNTER — Encounter: Payer: Self-pay | Admitting: Family

## 2024-10-26 ENCOUNTER — Ambulatory Visit: Payer: Self-pay | Admitting: Sleep Medicine

## 2024-10-26 ENCOUNTER — Other Ambulatory Visit: Payer: Self-pay

## 2024-10-26 ENCOUNTER — Ambulatory Visit: Attending: Family | Admitting: Family

## 2024-10-26 VITALS — BP 151/90 | HR 62 | Wt 225.4 lb

## 2024-10-26 DIAGNOSIS — Z79899 Other long term (current) drug therapy: Secondary | ICD-10-CM | POA: Diagnosis not present

## 2024-10-26 DIAGNOSIS — Z7985 Long-term (current) use of injectable non-insulin antidiabetic drugs: Secondary | ICD-10-CM | POA: Diagnosis not present

## 2024-10-26 DIAGNOSIS — I5032 Chronic diastolic (congestive) heart failure: Secondary | ICD-10-CM | POA: Diagnosis not present

## 2024-10-26 DIAGNOSIS — G4733 Obstructive sleep apnea (adult) (pediatric): Secondary | ICD-10-CM | POA: Diagnosis not present

## 2024-10-26 DIAGNOSIS — I2510 Atherosclerotic heart disease of native coronary artery without angina pectoris: Secondary | ICD-10-CM | POA: Diagnosis not present

## 2024-10-26 DIAGNOSIS — Z794 Long term (current) use of insulin: Secondary | ICD-10-CM | POA: Insufficient documentation

## 2024-10-26 DIAGNOSIS — M109 Gout, unspecified: Secondary | ICD-10-CM | POA: Diagnosis not present

## 2024-10-26 DIAGNOSIS — N183 Chronic kidney disease, stage 3 unspecified: Secondary | ICD-10-CM | POA: Diagnosis not present

## 2024-10-26 DIAGNOSIS — I272 Pulmonary hypertension, unspecified: Secondary | ICD-10-CM | POA: Insufficient documentation

## 2024-10-26 DIAGNOSIS — I10 Essential (primary) hypertension: Secondary | ICD-10-CM

## 2024-10-26 DIAGNOSIS — I428 Other cardiomyopathies: Secondary | ICD-10-CM | POA: Diagnosis not present

## 2024-10-26 DIAGNOSIS — M542 Cervicalgia: Secondary | ICD-10-CM | POA: Diagnosis not present

## 2024-10-26 DIAGNOSIS — E119 Type 2 diabetes mellitus without complications: Secondary | ICD-10-CM

## 2024-10-26 DIAGNOSIS — Z955 Presence of coronary angioplasty implant and graft: Secondary | ICD-10-CM | POA: Insufficient documentation

## 2024-10-26 DIAGNOSIS — Z7984 Long term (current) use of oral hypoglycemic drugs: Secondary | ICD-10-CM | POA: Diagnosis not present

## 2024-10-26 DIAGNOSIS — J4489 Other specified chronic obstructive pulmonary disease: Secondary | ICD-10-CM | POA: Diagnosis not present

## 2024-10-26 DIAGNOSIS — F17210 Nicotine dependence, cigarettes, uncomplicated: Secondary | ICD-10-CM | POA: Insufficient documentation

## 2024-10-26 DIAGNOSIS — I130 Hypertensive heart and chronic kidney disease with heart failure and stage 1 through stage 4 chronic kidney disease, or unspecified chronic kidney disease: Secondary | ICD-10-CM | POA: Insufficient documentation

## 2024-10-26 DIAGNOSIS — E1165 Type 2 diabetes mellitus with hyperglycemia: Secondary | ICD-10-CM | POA: Insufficient documentation

## 2024-10-26 DIAGNOSIS — E785 Hyperlipidemia, unspecified: Secondary | ICD-10-CM | POA: Diagnosis not present

## 2024-10-26 DIAGNOSIS — E1122 Type 2 diabetes mellitus with diabetic chronic kidney disease: Secondary | ICD-10-CM | POA: Insufficient documentation

## 2024-10-26 LAB — BASIC METABOLIC PANEL WITH GFR
BUN/Creatinine Ratio: 14 (ref 9–20)
BUN: 18 mg/dL (ref 6–24)
CO2: 24 mmol/L (ref 20–29)
Calcium: 9.3 mg/dL (ref 8.7–10.2)
Chloride: 102 mmol/L (ref 96–106)
Creatinine, Ser: 1.33 mg/dL — ABNORMAL HIGH (ref 0.76–1.27)
Glucose: 180 mg/dL — ABNORMAL HIGH (ref 70–99)
Potassium: 4.5 mmol/L (ref 3.5–5.2)
Sodium: 141 mmol/L (ref 134–144)
eGFR: 62 mL/min/1.73

## 2024-10-26 MED ORDER — VALSARTAN 320 MG PO TABS
320.0000 mg | ORAL_TABLET | Freq: Every day | ORAL | 5 refills | Status: AC
  Filled 2024-10-26: qty 30, 30d supply, fill #0

## 2024-10-26 NOTE — Patient Instructions (Addendum)
 Bring medication packet to every visit.   Medication Changes:  INCREASE Valsartan  to 320mg  (1 tab) daily  Lab Work:  Go downstairs to [facility] on [address] to have your blood work completed.  We will only call you if the results are abnormal or if the provider would like to make medication changes.  No news is good news.   Follow-Up in: Please follow up with the [facility] in 1 month with [name], FNP.   Thank you for choosing [facility].    At the [facility], you and your health needs are our priority. We have a designated team specialized in the treatment of Heart Failure. This Care Team includes your primary Heart Failure Specialized Cardiologist (physician), Advanced Practice Providers (APPs- Physician Assistants and Nurse Practitioners), and Pharmacist who all work together to provide you with the care you need, when you need it.   You may see any of the following providers on your designated Care Team at your next follow up:  Dr. Toribio Fuel Dr. Ezra Shuck Dr. Ria Commander Dr. Morene Brownie [name], FNP Jaun Bash, RPH-CPP  Please be sure to bring in all your medications bottles to every appointment.   Need to Contact Us :  If you have any questions or concerns before your next appointment please send us  a message through [place] or call our office at [phone].    TO LEAVE A MESSAGE FOR THE NURSE SELECT OPTION 2, PLEASE LEAVE A MESSAGE INCLUDING: YOUR NAME DATE OF BIRTH CALL BACK NUMBER REASON FOR CALL**this is important as we prioritize the call backs  YOU WILL RECEIVE A CALL BACK THE SAME DAY AS LONG AS YOU CALL BEFORE 4:00 PM

## 2024-10-27 ENCOUNTER — Telehealth: Payer: Self-pay | Admitting: Pharmacy Technician

## 2024-10-27 ENCOUNTER — Other Ambulatory Visit: Payer: Self-pay

## 2024-10-27 ENCOUNTER — Ambulatory Visit: Payer: Self-pay | Admitting: Family

## 2024-10-27 NOTE — Telephone Encounter (Signed)
 Pharmacy Patient Advocate Encounter   Received notification from CoverMyMeds that prior authorization for REPATHA  is required/requested.   Insurance verification completed.   The patient is insured through [employer].   Per test claim: PA required; PA submitted to above mentioned insurance via Latent Key/confirmation #/EOC [id number] Status is pending

## 2024-10-28 NOTE — Telephone Encounter (Signed)
 Pharmacy Patient Advocate Encounter  Received notification from [employer] that Prior Authorization for repatha  has been APPROVED from 10/27/24 to 10/27/25   PA #/Case ID/Reference #: [id number]

## 2024-10-28 NOTE — Progress Notes (Signed)
 Has appt with Dr. Jess to review hst and order cpap

## 2024-10-30 ENCOUNTER — Telehealth: Admitting: "Endocrinology

## 2024-11-03 ENCOUNTER — Other Ambulatory Visit: Payer: Self-pay | Admitting: *Deleted

## 2024-11-03 ENCOUNTER — Ambulatory Visit: Admitting: Sleep Medicine

## 2024-11-03 DIAGNOSIS — I5042 Chronic combined systolic (congestive) and diastolic (congestive) heart failure: Secondary | ICD-10-CM

## 2024-11-09 ENCOUNTER — Other Ambulatory Visit: Payer: Self-pay

## 2024-11-09 MED FILL — Duloxetine HCl Enteric Coated Pellets Cap 30 MG (Base Eq): ORAL | 30 days supply | Qty: 90 | Fill #4 | Status: CN

## 2024-11-09 MED FILL — Rosuvastatin Calcium Tab 40 MG: ORAL | 30 days supply | Qty: 30 | Fill #4 | Status: CN

## 2024-11-09 MED FILL — Ezetimibe Tab 10 MG: ORAL | 30 days supply | Qty: 30 | Fill #4 | Status: CN

## 2024-11-09 MED FILL — Allopurinol Tab 100 MG: ORAL | 30 days supply | Qty: 30 | Fill #4 | Status: CN

## 2024-11-10 ENCOUNTER — Other Ambulatory Visit: Payer: Self-pay

## 2024-11-11 ENCOUNTER — Encounter: Payer: Self-pay | Admitting: Nurse Practitioner

## 2024-11-11 ENCOUNTER — Telehealth: Payer: Self-pay

## 2024-11-11 ENCOUNTER — Other Ambulatory Visit: Payer: Self-pay

## 2024-11-11 ENCOUNTER — Ambulatory Visit
Admission: RE | Admit: 2024-11-11 | Discharge: 2024-11-11 | Disposition: A | Discharge status: Home / Self Care | Attending: Nurse Practitioner | Admitting: Nurse Practitioner

## 2024-11-11 ENCOUNTER — Ambulatory Visit: Payer: Self-pay | Admitting: Nurse Practitioner

## 2024-11-11 ENCOUNTER — Ambulatory Visit: Admitting: Nurse Practitioner

## 2024-11-11 ENCOUNTER — Ambulatory Visit
Admission: RE | Admit: 2024-11-11 | Discharge: 2024-11-11 | Disposition: A | Source: Ambulatory Visit | Attending: Nurse Practitioner

## 2024-11-11 ENCOUNTER — Other Ambulatory Visit: Payer: Self-pay | Admitting: Nurse Practitioner

## 2024-11-11 ENCOUNTER — Ambulatory Visit: Payer: Self-pay

## 2024-11-11 VITALS — BP 170/70 | HR 75 | Temp 98.0°F | Ht 66.0 in | Wt 225.0 lb

## 2024-11-11 DIAGNOSIS — R0602 Shortness of breath: Secondary | ICD-10-CM

## 2024-11-11 DIAGNOSIS — J441 Chronic obstructive pulmonary disease with (acute) exacerbation: Secondary | ICD-10-CM | POA: Diagnosis not present

## 2024-11-11 DIAGNOSIS — J4530 Mild persistent asthma, uncomplicated: Secondary | ICD-10-CM

## 2024-11-11 DIAGNOSIS — J0140 Acute pansinusitis, unspecified: Secondary | ICD-10-CM | POA: Diagnosis not present

## 2024-11-11 MED ORDER — PREDNISONE 20 MG PO TABS
20.0000 mg | ORAL_TABLET | Freq: Every day | ORAL | 0 refills | Status: AC
  Filled 2024-11-11: qty 5, 5d supply, fill #0

## 2024-11-11 MED ORDER — DOXYCYCLINE HYCLATE 100 MG PO TABS
100.0000 mg | ORAL_TABLET | Freq: Two times a day (BID) | ORAL | 0 refills | Status: DC
  Filled 2024-11-11: qty 14, 7d supply, fill #0

## 2024-11-11 MED ORDER — IPRATROPIUM-ALBUTEROL 0.5-2.5 (3) MG/3ML IN SOLN
3.0000 mL | Freq: Four times a day (QID) | RESPIRATORY_TRACT | 1 refills | Status: AC | PRN
  Filled 2024-11-11: qty 180, 15d supply, fill #0

## 2024-11-11 MED ORDER — VENTOLIN HFA 108 (90 BASE) MCG/ACT IN AERS
2.0000 | INHALATION_SPRAY | Freq: Four times a day (QID) | RESPIRATORY_TRACT | 2 refills | Status: AC | PRN
  Filled 2024-11-11: qty 18, 25d supply, fill #0

## 2024-11-11 MED ORDER — BENZONATATE 100 MG PO CAPS
200.0000 mg | ORAL_CAPSULE | Freq: Two times a day (BID) | ORAL | 0 refills | Status: DC | PRN
  Filled 2024-11-11: qty 20, 5d supply, fill #0

## 2024-11-11 NOTE — Progress Notes (Signed)
 "  BP (!) 170/70   Pulse 75   Temp 98 F (36.7 C)   Ht 5' 6 (1.676 m)   Wt 225 lb (102.1 kg)   SpO2 96%   BMI 36.32 kg/m    Subjective:    Patient ID: Cory Rivas, male    DOB: Jun 27, 1966, 59 y.o.   MRN: [id number]  HPI: Cory Rivas is a 59 y.o. male  Chief Complaint  Patient presents with   Cough    Pt c/o cough x3 weeks.    Discussed the use of AI scribe software for clinical note transcription with the patient, who gave verbal consent to proceed.  History of Present Illness Cory Rivas Cory Rivas is a 59 year old male with COPD and asthma who presents with a persistent cough.  Cough and upper respiratory symptoms - Persistent cough for several weeks - Associated with congestion, sinus pressure and tenderness - Occasional epistaxis - No fever  Dyspnea and wheezing - Shortness of breath and frequent wheezing, especially when lying down or after physical exertion (e.g., clearing snow) - Episodes of feeling unable to breathe well, particularly when supine - Uses a hospital bed with a memory foam pillow to improve nocturnal breathing  Back pain - Back pain, possibly related to excessive coughing  Chronic obstructive pulmonary disease and asthma management - History of COPD with asthma - Current medications: Symbicort  twice daily, Xyzal  5 mg daily, albuterol  inhaler as needed - Despite current regimen, continues to experience frequent wheezing and dyspnea - Uses a CPAP machine - Requires a new nebulizer due to malfunction of current device - he uses his nebulizer for his COPD and asthma  Diabetes mellitus type 2 - History of type 2 diabetes  Infectious exposure - Sister recently became ill after returning from a trip         09/24/2024   10:45 AM 01/09/2024    1:26 PM 09/16/2023   10:12 AM  Depression screen PHQ 2/9  Decreased Interest 0 0 0  Down, Depressed, Hopeless 1 0 0  PHQ - 2 Score 1 0 0  Altered sleeping 2 2 2   Tired, decreased energy 3 2 2    Change in appetite 1 0 0  Feeling bad or failure about yourself  0 0 0  Trouble concentrating 1 1 1   Moving slowly or fidgety/restless 2 0 0  Suicidal thoughts 0 0 0  PHQ-9 Score 10 5  5    Difficult doing work/chores  Somewhat difficult Somewhat difficult     Data saved with a previous flowsheet row definition    Relevant past medical, surgical, family and social history reviewed and updated as indicated. Interim medical history since our last visit reviewed. Allergies and medications reviewed and updated.  Review of Systems  Ten systems reviewed and is negative except as mentioned in HPI      Objective:      BP (!) 170/70   Pulse 75   Temp 98 F (36.7 C)   Ht 5' 6 (1.676 m)   Wt 225 lb (102.1 kg)   SpO2 96%   BMI 36.32 kg/m    Wt Readings from Last 3 Encounters:  11/11/24 225 lb (102.1 kg)  10/26/24 225 lb 6 oz (102.2 kg)  10/19/24 223 lb (101.2 kg)    Physical Exam GENERAL: Alert, cooperative, well developed, no acute distress. HEENT: Normocephalic, normal oropharynx, moist mucous membranes, TMs clear CHEST: Wheezing present, no rhonchi or rales CARDIOVASCULAR: Normal heart rate and  rhythm, S1 and S2 normal without murmurs. ABDOMEN: Soft, mild tenderness, non-distended, without organomegaly, normal bowel sounds. EXTREMITIES: No cyanosis or edema. NEUROLOGICAL: Cranial nerves grossly intact, moves all extremities without gross motor or sensory deficit.  Results for orders placed or performed in visit on 10/26/24  Basic metabolic panel with GFR   Collection Time: 10/26/24  9:45 AM  Result Value Ref Range   Glucose 180 (H) 70 - 99 mg/dL   BUN 18 6 - 24 mg/dL   Creatinine, Ser 8.66 (H) 0.76 - 1.27 mg/dL   eGFR 62 >40 fO/fpw/8.26   BUN/Creatinine Ratio 14 9 - 20   Sodium 141 134 - 144 mmol/L   Potassium 4.5 3.5 - 5.2 mmol/L   Chloride 102 96 - 106 mmol/L   CO2 24 20 - 29 mmol/L   Calcium  9.3 8.7 - 10.2 mg/dL          Assessment & Plan:   Problem List  Items Addressed This Visit       Respiratory   COPD exacerbation (HCC) - Primary   Relevant Medications   predniSONE  (DELTASONE ) 20 MG tablet   doxycycline  (VIBRA -TABS) 100 MG tablet   benzonatate  (TESSALON ) 100 MG capsule   VENTOLIN  HFA 108 (90 Base) MCG/ACT inhaler   ipratropium-albuterol  (DUONEB) 0.5-2.5 (3) MG/3ML SOLN   Other Relevant Orders   DG Chest 2 View   For home use only DME Nebulizer machine   Other Visit Diagnoses       Acute non-recurrent pansinusitis       Relevant Medications   predniSONE  (DELTASONE ) 20 MG tablet   doxycycline  (VIBRA -TABS) 100 MG tablet   benzonatate  (TESSALON ) 100 MG capsule     Shortness of breath       Relevant Medications   VENTOLIN  HFA 108 (90 Base) MCG/ACT inhaler   ipratropium-albuterol  (DUONEB) 0.5-2.5 (3) MG/3ML SOLN     Mild persistent asthma without complication       Relevant Medications   predniSONE  (DELTASONE ) 20 MG tablet   VENTOLIN  HFA 108 (90 Base) MCG/ACT inhaler   ipratropium-albuterol  (DUONEB) 0.5-2.5 (3) MG/3ML SOLN        Assessment and Plan Assessment & Plan COPD exacerbation with asthma COPD exacerbation with asthma, presenting with cough, wheezing, and shortness of breath. No fever reported. Wheezing present on examination. Possible triggers include wood burning and recent cold exposure. Differential includes bronchitis and pneumonia. - Prescribed prednisone  to reduce inflammation and improve breathing. - Prescribed doxycycline  to cover potential bacterial infection. - Advised to monitor blood sugar levels due to potential elevation from steroid use. - Instructed to obtain chest x-ray if symptoms do not improve. - Prescribed albuterol  inhaler for acute bronchospasm. - Discussed need for new nebulizer and coordinated with Josh for ordering.  Acute pansinusitis Symptoms of nasal congestion and epistaxis. No fever reported. Symptoms may be contributing to respiratory symptoms. - Prescribed doxycycline  to address  potential bacterial sinus infection. - Advised to monitor symptoms and obtain chest x-ray if no improvement.  Type 2 diabetes mellitus Potential for elevated blood sugar levels due to steroid use for COPD exacerbation. - Advised to monitor blood sugar levels closely. - Discussed potential need to adjust insulin  dosage if blood sugar levels rise.        Follow up plan: Return if symptoms worsen or fail to improve. "

## 2024-11-11 NOTE — Telephone Encounter (Signed)
 Copied from CRM #[id number]. Topic: Clinical - Order For Equipment >> Nov 11, 2024 11:51 AM Montie POUR wrote: Reason for CRM:  [facility] called and said they to do not file orders for nebulizer with insurance companies. Please call [phone] to let them know if you would like to send order some where else or [facility] can call patient with out of pocket cost.

## 2024-11-11 NOTE — Telephone Encounter (Signed)
 Faxed DME orders for nebulizer to [facility]. Placed in basket to be scanned.

## 2024-11-11 NOTE — Telephone Encounter (Signed)
 FYI Only or Action Required?: FYI only for provider: appointment scheduled on 11/11/24.  Patient was last seen in primary care on 09/24/2024 by Gareth Mliss FALCON, FNP.  Called Nurse Triage reporting Cough.  Symptoms began several weeks ago.  Interventions attempted: Prescription medications: Symbicort , Flonase .  Symptoms are: unchanged.  Triage Disposition: See HCP Within 4 Hours (Or PCP Triage)  Patient/caregiver understands and will follow disposition?: Yes   Reason for Disposition  Wheezing is present  Answer Assessment - Initial Assessment Questions Pt using symbicort  and flonase , has not taken anything OTC. Pt reports noticing these symptoms worsening when he puts his wood stove on, education given regarding dry air, advised to get a humidifier at home.   1. ONSET: When did the cough begin?      2-3 weeks  2. SEVERITY: How bad is the cough today?      Worse at night time  3. SPUTUM: Describe the color of your sputum (e.g., none, dry cough; clear, white, yellow, green)     White  4. HEMOPTYSIS: Are you coughing up any blood? If Yes, ask: How much? (e.g., flecks, streaks, tablespoons, etc.)     Long streaks, happens occasionally  5. DIFFICULTY BREATHING: Are you having difficulty breathing? If Yes, ask: How bad is it? (e.g., mild, moderate, severe)      Denies  6. FEVER: Do you have a fever? If Yes, ask: What is your temperature, how was it measured, and when did it start?     Denies  7. CARDIAC HISTORY: Do you have any history of heart disease? (e.g., heart attack, congestive heart failure)      CAD, hypertension  8. LUNG HISTORY: Do you have any history of lung disease?  (e.g., pulmonary embolus, asthma, emphysema)     COPD with asthma   9. OTHER SYMPTOMS: Do you have any other symptoms? (e.g., runny nose, wheezing, chest pain)       Nasal congestion, nasal drainage has some blood when blowing nose,  Protocols used: Cough - Acute  Productive-A-AH  Message from Ivette P sent at 11/11/2024  8:33 AM EST  Reason for Triage: coughing, blow nose and seeing blood. got flonase  spray and not working. congestion and stopped up.

## 2024-11-11 NOTE — Telephone Encounter (Signed)
 Left detailed vm w/ pt to call to see price and if willing to get

## 2024-11-12 ENCOUNTER — Other Ambulatory Visit: Payer: Self-pay

## 2024-11-12 MED ORDER — ASPIRIN 81 MG PO TBEC
81.0000 mg | DELAYED_RELEASE_TABLET | Freq: Every day | ORAL | 3 refills | Status: AC
  Filled 2024-11-12 – 2024-11-13 (×2): qty 30, 30d supply, fill #0

## 2024-11-12 NOTE — Telephone Encounter (Signed)
 Requested Prescriptions  Pending Prescriptions Disp Refills   aspirin  EC (ASPIRIN  LOW DOSE) 81 MG tablet 90 tablet 3    Sig: Take 1 tablet (81 mg total) by mouth daily. Swallow whole.     Analgesics:  NSAIDS - aspirin  Failed - 11/12/2024  3:23 PM      Failed - Cr in normal range and within 360 days    Creat  Date Value Ref Range Status  02/10/2024 1.30 0.70 - 1.30 mg/dL Final   Creatinine, Ser  Date Value Ref Range Status  10/26/2024 1.33 (H) 0.76 - 1.27 mg/dL Final   Creatinine, Urine  Date Value Ref Range Status  05/29/2024 27 20 - 320 mg/dL Final         Passed - eGFR is 10 or above and within 360 days    GFR, Est African American  Date Value Ref Range Status  09/30/2020 83 > OR = 60 mL/min/1.4m2 Final   GFR, Est Non African American  Date Value Ref Range Status  09/30/2020 71 > OR = 60 mL/min/1.82m2 Final   GFR, Estimated  Date Value Ref Range Status  10/19/2024 >60 >60 mL/min Final    Comment:    (NOTE) Calculated using the CKD-EPI Creatinine Equation (2021)    eGFR  Date Value Ref Range Status  10/26/2024 62 >59 mL/min/1.73 Final         Passed - Patient is not pregnant      Passed - Valid encounter within last 12 months    Recent Outpatient Visits           Yesterday COPD exacerbation [facility])   [facility] Gareth Mliss FALCON, FNP   1 month ago Accelerated hypertension   [facility] Gareth Mliss FALCON, FNP   5 months ago Uncontrolled diabetes mellitus with hyperglycemia, with long-term current use of insulin  [facility])   [facility] Leavy Mole, PA-C   6 months ago Allergic rash present on examination   [facility] [name], DO   8 months ago Hospital discharge follow-up   [facility] Leavy Mole, PA-C       Future Appointments             In 1 month Lorene Lesley CROME, PA-C [facility]

## 2024-11-13 ENCOUNTER — Other Ambulatory Visit: Payer: Self-pay

## 2024-11-13 MED FILL — Duloxetine HCl Enteric Coated Pellets Cap 30 MG (Base Eq): ORAL | 30 days supply | Qty: 90 | Fill #4 | Status: AC

## 2024-11-13 MED FILL — Rosuvastatin Calcium Tab 40 MG: ORAL | 30 days supply | Qty: 30 | Fill #4 | Status: AC

## 2024-11-13 MED FILL — Ezetimibe Tab 10 MG: ORAL | 30 days supply | Qty: 30 | Fill #4 | Status: AC

## 2024-11-13 MED FILL — Allopurinol Tab 100 MG: ORAL | 30 days supply | Qty: 30 | Fill #4 | Status: AC

## 2024-11-15 ENCOUNTER — Other Ambulatory Visit: Payer: Self-pay

## 2024-11-16 ENCOUNTER — Other Ambulatory Visit (HOSPITAL_COMMUNITY): Payer: Self-pay

## 2024-11-16 ENCOUNTER — Other Ambulatory Visit: Payer: Self-pay

## 2024-11-17 ENCOUNTER — Encounter: Payer: Self-pay | Admitting: Internal Medicine

## 2024-11-17 ENCOUNTER — Ambulatory Visit: Admitting: Internal Medicine

## 2024-11-17 ENCOUNTER — Other Ambulatory Visit: Payer: Self-pay

## 2024-11-17 ENCOUNTER — Ambulatory Visit: Payer: Self-pay

## 2024-11-17 VITALS — BP 130/80 | HR 80 | Temp 98.3°F | Resp 16 | Ht 66.0 in | Wt 226.9 lb

## 2024-11-17 DIAGNOSIS — J441 Chronic obstructive pulmonary disease with (acute) exacerbation: Secondary | ICD-10-CM

## 2024-11-17 DIAGNOSIS — R051 Acute cough: Secondary | ICD-10-CM

## 2024-11-17 MED ORDER — BENZONATATE 100 MG PO CAPS
200.0000 mg | ORAL_CAPSULE | Freq: Two times a day (BID) | ORAL | 0 refills | Status: AC | PRN
  Filled 2024-11-17: qty 20, 5d supply, fill #0

## 2024-11-17 NOTE — Patient Instructions (Addendum)
 It was great seeing you today!  Plan discussed at today's visit: -Use humidifier or humidified air on CPAP at night to prevent dryness -Use nasal saline up to 4 times a day to help with congestion, sinus pressure and bleeding -Also recommend Coricidin for high blood pressure for sinus pain/pressure -Continue to use inhalers daily  -Continue tessalon  perles as needed for cough  Follow up in: as needed  Take care and let us  know if you have any questions or concerns prior to your next visit.  Dr. Bernardo

## 2024-11-17 NOTE — Telephone Encounter (Signed)
 FYI Only or Action Required?: FYI only for provider: appointment scheduled on 2/3.  Patient was last seen in primary care on 11/11/2024 by Gareth Mliss FALCON, FNP.  Called Nurse Triage reporting Cough.  Symptoms began about a month ago.  Interventions attempted: Rest, hydration, or home remedies.  Symptoms are: gradually worsening.  Triage Disposition: See HCP Within 4 Hours (Or PCP Triage)  Patient/caregiver understands and will follow disposition?: Yes, will follow disposition  Reason for Triage: Bleeding nose, trouble breathing and heavier. Seen pcp recently states conditions are no better  Reason for Disposition  Wheezing is present  Answer Assessment - Initial Assessment Questions 1. ONSET: When did the cough begin?      Ongoing for 4 weeks, has been seen by PCP 2. SEVERITY: How bad is the cough today?      moderate 3. SPUTUM: Describe the color of your sputum (e.g., none, dry cough; clear, white, yellow, green)     white 4. HEMOPTYSIS: Are you coughing up any blood? If Yes, ask: How much? (e.g., flecks, streaks, tablespoons, etc.)     denies 5. DIFFICULTY BREATHING: Are you having difficulty breathing? If Yes, ask: How bad is it? (e.g., mild, moderate, severe)      mild 6. FEVER: Do you have a fever? If Yes, ask: What is your temperature, how was it measured, and when did it start?     denies 10. OTHER SYMPTOMS: Do you have any other symptoms? (e.g., runny nose, wheezing, chest pain)       Nose bleeds when blows nose, wheezing at times 12. TRAVEL: Have you traveled out of the country in the last month? (e.g., travel history, exposures)       denies  Protocols used: Cough - Acute Productive-A-AH

## 2024-11-17 NOTE — Progress Notes (Signed)
 "  Acute Office Visit  Subjective:     Patient ID: Cory Rivas, male    DOB: Oct 16, 1965, 59 y.o.   MRN: [id number]  Chief Complaint  Patient presents with   Cough    Congestion, bloody nose when sneezing, body aches for 4 weeks    Cough Associated symptoms include shortness of breath and wheezing. Pertinent negatives include no chills, ear pain, fever or sore throat.   Patient is in today for cough x 4 weeks. He was seen here on 1/28 for these same symptoms, treated with Prednisone  and Doxycycline . Chest x-ray 11/11/24 negative for acute findings.   Discussed the use of AI scribe software for clinical note transcription with the patient, who gave verbal consent to proceed.  History of Present Illness Cory Rivas is a 59 year old male with COPD who presents with worsening cough and nasal bleeding.  He has had a worsening productive cough for four weeks. Sputum changed from clear to yellow with occasional blood streaks. Cough causes chest and back pain and is worse in the mornings and evenings, with severe episodes yesterday and last night.  He has shortness of breath and wheezing with walking and household activities. He uses Symbicort  and Spiriva  daily and albuterol  as needed. He recently completed doxycycline  and prednisone  with temporary improvement, but the cough returned after finishing them. A chest x-ray on January 28 was normal.  Cold air worsens his cough and causes rhinorrhea followed by nosebleeds. He uses Flonase . He has significant sinus pressure, congestion, frequent sneezing, and exertional worsening, including with scraping ice and snow. He has been using albuterol  frequently and needs a refill. He reports that his inhaler sometimes sprays prematurely, making use difficult.   Review of Systems  Constitutional:  Negative for chills and fever.  HENT:  Positive for congestion. Negative for ear pain and sore throat.   Respiratory:  Positive for cough, shortness  of breath and wheezing.         Objective:    BP 130/80 (Cuff Size: Large)   Pulse 80   Temp 98.3 F (36.8 C) (Oral)   Resp 16   Wt 226 lb 14.4 oz (102.9 kg)   SpO2 97%   BMI 36.62 kg/m  BP Readings from Last 3 Encounters:  11/17/24 130/80  11/11/24 (!) 170/70  10/26/24 (!) 151/90   Wt Readings from Last 3 Encounters:  11/17/24 226 lb 14.4 oz (102.9 kg)  11/11/24 225 lb (102.1 kg)  10/26/24 225 lb 6 oz (102.2 kg)      Physical Exam Constitutional:      Appearance: Normal appearance.  HENT:     Head: Normocephalic and atraumatic.     Right Ear: Tympanic membrane, ear canal and external ear normal.     Left Ear: Ear canal and external ear normal. There is impacted cerumen.     Nose: Nose normal.     Mouth/Throat:     Mouth: Mucous membranes are moist.     Pharynx: Oropharynx is clear.  Eyes:     Conjunctiva/sclera: Conjunctivae normal.  Cardiovascular:     Rate and Rhythm: Normal rate and regular rhythm.  Pulmonary:     Effort: Pulmonary effort is normal.     Breath sounds: Normal breath sounds. No wheezing, rhonchi or rales.  Skin:    General: Skin is warm and dry.  Neurological:     General: No focal deficit present.     Mental Status: He is alert. Mental status  is at baseline.  Psychiatric:        Mood and Affect: Mood normal.        Behavior: Behavior normal.     No results found for any visits on 11/17/24.      Assessment & Plan:   Assessment & Plan COPD exacerbation Likely viral trigger with increased cough, dyspnea, wheezing. No pneumonia on chest x-ray. Symptoms may take 4-6 weeks to resolve. Completed antibiotic and steroid course.  - Continue Symbicort  and Spiriva  daily. - Use albuterol  inhaler as needed, up to every 4-6 hours. - Refilled albuterol  inhaler. - Encouraged rest and hydration. - Recommended vitamin C and zinc. - Advised use of humidifier. - Instructed to monitor symptoms and report worsening.  Acute upper respiratory tract  infection with epistaxis Nasal congestion, sinus pressure, epistaxis likely due to dryness. No fever. Avoided Flonase  due to dryness risk. - Use nasal saline spray up to four times a day. - Avoid Flonase . - Use Coricidin for sinus pain and congestion. - Monitor for worsening symptoms.  - benzonatate  (TESSALON ) 100 MG capsule; Take 2 capsules (200 mg total) by mouth 2 (two) times daily as needed for cough.  Dispense: 20 capsule; Refill: 0   Return if symptoms worsen or fail to improve.  Sharyle Fischer, DO   "

## 2024-11-19 ENCOUNTER — Other Ambulatory Visit: Payer: Self-pay

## 2024-11-19 MED FILL — Continuous Glucose System Sensor: 28 days supply | Qty: 2 | Fill #2 | Status: AC

## 2024-11-24 ENCOUNTER — Ambulatory Visit: Admitting: Family

## 2024-12-24 ENCOUNTER — Ambulatory Visit: Admitting: Nurse Practitioner

## 2024-12-29 ENCOUNTER — Ambulatory Visit: Admitting: Physician Assistant

## 2025-02-26 ENCOUNTER — Ambulatory Visit: Admitting: Family
